# Patient Record
Sex: Female | Born: 1985
Health system: Southern US, Community
[De-identification: ages and names within clinical notes are randomized; demographics above are authoritative.]

## PROBLEM LIST (undated history)

## (undated) DIAGNOSIS — D649 Anemia, unspecified: Secondary | ICD-10-CM

## (undated) DIAGNOSIS — F339 Major depressive disorder, recurrent, unspecified: Secondary | ICD-10-CM

## (undated) DIAGNOSIS — D571 Sickle-cell disease without crisis: Secondary | ICD-10-CM

## (undated) DIAGNOSIS — G43909 Migraine, unspecified, not intractable, without status migrainosus: Secondary | ICD-10-CM

## (undated) HISTORY — PX: MULTIPLE TOOTH EXTRACTIONS: SHX2053

## (undated) HISTORY — PX: TUBAL LIGATION: SHX77

## (undated) HISTORY — PX: OTHER SURGICAL HISTORY: SHX169

---
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
## 2012-09-27 ENCOUNTER — Ambulatory Visit: Payer: Medicaid Other | Admitting: Internal Medicine

## 2012-10-04 ENCOUNTER — Ambulatory Visit: Payer: Medicaid Other | Admitting: Internal Medicine

## 2012-10-13 ENCOUNTER — Ambulatory Visit: Payer: Medicaid Other | Admitting: Internal Medicine

## 2013-06-14 ENCOUNTER — Telehealth: Payer: Self-pay | Admitting: General Practice

## 2013-06-14 NOTE — Telephone Encounter (Signed)
PA Newman Piesavid Leedy called to determine if/when patient has been seen at this practice.

## 2013-08-29 ENCOUNTER — Telehealth: Payer: Self-pay

## 2013-08-29 NOTE — Telephone Encounter (Signed)
Pt would like to set up an Est. Care Appointment. Pt is aware of the wait time and Pt is an [facility] Pt

## 2013-11-16 ENCOUNTER — Emergency Department (HOSPITAL_COMMUNITY)
Admission: EM | Admit: 2013-11-16 | Discharge: 2013-11-16 | Disposition: A | Discharge status: Home / Self Care | Payer: Medicaid Other | Attending: Emergency Medicine | Admitting: Emergency Medicine

## 2013-11-16 ENCOUNTER — Encounter (HOSPITAL_COMMUNITY): Payer: Self-pay | Admitting: Emergency Medicine

## 2013-11-16 ENCOUNTER — Emergency Department (HOSPITAL_COMMUNITY): Payer: Medicaid Other

## 2013-11-16 DIAGNOSIS — Z79899 Other long term (current) drug therapy: Secondary | ICD-10-CM | POA: Diagnosis not present

## 2013-11-16 DIAGNOSIS — F17200 Nicotine dependence, unspecified, uncomplicated: Secondary | ICD-10-CM | POA: Insufficient documentation

## 2013-11-16 DIAGNOSIS — R0789 Other chest pain: Secondary | ICD-10-CM | POA: Diagnosis not present

## 2013-11-16 DIAGNOSIS — D5700 Hb-SS disease with crisis, unspecified: Secondary | ICD-10-CM | POA: Insufficient documentation

## 2013-11-16 DIAGNOSIS — R0602 Shortness of breath: Secondary | ICD-10-CM | POA: Insufficient documentation

## 2013-11-16 HISTORY — DX: Sickle-cell disease without crisis: D57.1

## 2013-11-16 LAB — RETICULOCYTES
RBC.: 2.4 MIL/uL — ABNORMAL LOW (ref 3.87–5.11)
RETIC CT PCT: 7.7 % — AB (ref 0.4–3.1)
Retic Count, Absolute: 184.8 10*3/uL (ref 19.0–186.0)

## 2013-11-16 LAB — I-STAT CHEM 8, ED
BUN: 4 mg/dL — ABNORMAL LOW (ref 6–23)
CALCIUM ION: 1.19 mmol/L (ref 1.12–1.23)
Chloride: 113 mEq/L — ABNORMAL HIGH (ref 96–112)
Creatinine, Ser: 0.7 mg/dL (ref 0.50–1.10)
GLUCOSE: 95 mg/dL (ref 70–99)
HEMATOCRIT: 26 % — AB (ref 36.0–46.0)
Hemoglobin: 8.8 g/dL — ABNORMAL LOW (ref 12.0–15.0)
Potassium: 3.2 mEq/L — ABNORMAL LOW (ref 3.7–5.3)
Sodium: 142 mEq/L (ref 137–147)
TCO2: 17 mmol/L (ref 0–100)

## 2013-11-16 LAB — CBC WITH DIFFERENTIAL/PLATELET
Basophils Absolute: 0 10*3/uL (ref 0.0–0.1)
Basophils Relative: 0 % (ref 0–1)
EOS ABS: 0.1 10*3/uL (ref 0.0–0.7)
EOS PCT: 1 % (ref 0–5)
HCT: 22.9 % — ABNORMAL LOW (ref 36.0–46.0)
Hemoglobin: 8.4 g/dL — ABNORMAL LOW (ref 12.0–15.0)
Lymphocytes Relative: 40 % (ref 12–46)
Lymphs Abs: 4.5 10*3/uL — ABNORMAL HIGH (ref 0.7–4.0)
MCH: 35 pg — AB (ref 26.0–34.0)
MCHC: 36.7 g/dL — AB (ref 30.0–36.0)
MCV: 95.4 fL (ref 78.0–100.0)
Monocytes Absolute: 1.1 10*3/uL — ABNORMAL HIGH (ref 0.1–1.0)
Monocytes Relative: 9 % (ref 3–12)
NEUTROS PCT: 50 % (ref 43–77)
Neutro Abs: 5.7 10*3/uL (ref 1.7–7.7)
PLATELETS: 225 10*3/uL (ref 150–400)
RBC: 2.4 MIL/uL — ABNORMAL LOW (ref 3.87–5.11)
RDW: 24.3 % — ABNORMAL HIGH (ref 11.5–15.5)
WBC: 11.4 10*3/uL — ABNORMAL HIGH (ref 4.0–10.5)

## 2013-11-16 LAB — I-STAT TROPONIN, ED: TROPONIN I, POC: 0 ng/mL (ref 0.00–0.08)

## 2013-11-16 MED ORDER — HYDROMORPHONE HCL 1 MG/ML IJ SOLN
2.0000 mg | Freq: Once | INTRAMUSCULAR | Status: AC
  Administered 2013-11-16: 2 mg via INTRAVENOUS
  Filled 2013-11-16: qty 2

## 2013-11-16 MED ORDER — POTASSIUM CHLORIDE CRYS ER 20 MEQ PO TBCR
40.0000 meq | EXTENDED_RELEASE_TABLET | Freq: Once | ORAL | Status: AC
  Administered 2013-11-16: 40 meq via ORAL
  Filled 2013-11-16: qty 2

## 2013-11-16 MED ORDER — HEPARIN SOD (PORK) LOCK FLUSH 100 UNIT/ML IV SOLN
500.0000 [IU] | Freq: Once | INTRAVENOUS | Status: AC
  Administered 2013-11-16: 250 [IU]
  Filled 2013-11-16 (×2): qty 5

## 2013-11-16 MED ORDER — SODIUM CHLORIDE 0.9 % IV BOLUS (SEPSIS)
1000.0000 mL | Freq: Once | INTRAVENOUS | Status: AC
  Administered 2013-11-16: 1000 mL via INTRAVENOUS

## 2013-11-16 NOTE — Discharge Instructions (Signed)
Continue current medications. Please follow up with a primary care doctor.    Sickle Cell Anemia, Adult Sickle cell anemia is a condition in which red blood cells have an abnormal "sickle" shape. This abnormal shape shortens the cells' life span, which results in a lower than normal concentration of red blood cells in the blood. The sickle shape also causes the cells to clump together and block free blood flow through the blood vessels. As a result, the tissues and organs of the body do not receive enough oxygen. Sickle cell anemia causes organ damage and pain and increases the risk of infection. CAUSES  Sickle cell anemia is a genetic disorder. Those who receive two copies of the gene have the condition, and those who receive one copy have the trait. RISK FACTORS The sickle cell gene is most common in people whose families originated in [place]. Other areas of the globe where sickle cell trait occurs include the [place], [place], the [place], and the [place].  SIGNS AND SYMPTOMS  Pain, especially in the extremities, back, chest, or abdomen (common). The pain may start suddenly or may develop following an illness, especially if there is dehydration. Pain can also occur due to overexertion or exposure to extreme temperature changes.  Frequent severe bacterial infections, especially certain types of pneumonia and meningitis.  Pain and swelling in the hands and feet.  Decreased activity.   Loss of appetite.   Change in behavior.  Headaches.  Seizures.  Shortness of breath or difficulty breathing.  Vision changes.  Skin ulcers. Those with the trait may not have symptoms or they may have mild symptoms.  DIAGNOSIS  Sickle cell anemia is diagnosed with blood tests that demonstrate the genetic trait. It is often diagnosed during the newborn period, due to mandatory testing nationwide. A variety of blood tests, X-rays, CT scans, MRI scans, ultrasounds, and lung  function tests may also be done to monitor the condition. TREATMENT  Sickle cell anemia may be treated with:  Medicines. You may be given pain medicines, antibiotic medicines (to treat and prevent infections) or medicines to increase the production of certain types of hemoglobin.  Fluids.  Oxygen.  Blood transfusions. HOME CARE INSTRUCTIONS   Drink enough fluid to keep your urine clear or pale yellow. Increase your fluid intake in hot weather and during exercise.  Do not smoke. Smoking lowers oxygen levels in the blood.   Only take over-the-counter or prescription medicines for pain, fever, or discomfort as directed by your health care provider.  Take antibiotics as directed by your health care provider. Make sure you finish them it even if you start to feel better.   Take supplements as directed by your health care provider.   Consider wearing a medical alert bracelet. This tells anyone caring for you in an emergency of your condition.   When traveling, keep your medical information, health care provider's names, and the medicines you take with you at all times.   If you develop a fever, do not take medicines to reduce the fever right away. This could cover up a problem that is developing. Notify your health care provider.  Keep all follow-up appointments with your health care provider. Sickle cell anemia requires regular medical care. SEEK MEDICAL CARE IF: You have a fever. SEEK IMMEDIATE MEDICAL CARE IF:   You feel dizzy or faint.   You have new abdominal pain, especially on the left side near the stomach area.   You develop a persistent,  often uncomfortable and painful penile erection (priapism). If this is not treated immediately it will lead to impotence.   You have numbness your arms or legs or you have a hard time moving them.   You have a hard time with speech.   You have a fever or persistent symptoms for more than 2-3 days.   You have a fever and  your symptoms suddenly get worse.   You have signs or symptoms of infection. These include:   Chills.   Abnormal tiredness (lethargy).   Irritability.   Poor eating.   Vomiting.   You develop pain that is not helped with medicine.   You develop shortness of breath.  You have pain in your chest.   You are coughing up pus-like or bloody sputum.   You develop a stiff neck.  Your feet or hands swell or have pain.  Your abdomen appears bloated.  You develop joint pain. MAKE SURE YOU:  Understand these instructions. Document Released: 05/27/2005 Document Revised: 07/03/2013 Document Reviewed: 09/28/2012 [facility] Patient Information 2015 [place], [place]. This information is not intended to replace advice given to you by your health care provider. Make sure you discuss any questions you have with your health care provider.

## 2013-11-16 NOTE — ED Notes (Signed)
PT monitored by pulse ox, bp cuff, and 5-lead. 

## 2013-11-16 NOTE — ED Notes (Signed)
Pt in c/o sickle cell pain crisis, reports chest pain and bilateral leg pain which patient states is typical of her crisis, symptoms x2 days, no distress noted

## 2013-11-16 NOTE — ED Provider Notes (Signed)
CSN: [id number]     Arrival date & time 11/16/13  1434 History   First MD Initiated Contact with Patient 11/16/13 1610     Chief Complaint  Patient presents with  . Sickle Cell Pain Crisis     (Consider location/radiation/quality/duration/timing/severity/associated sxs/prior Treatment) HPI Brittany Foster is a 28 y.o. female with history of sickle cell disease, presents to emergency department complaining of bilateral leg pain and chest pain for 3 days. She states she currently does not have a primary care Dr., she does not want to see her Dr. Durwin Nora. She states she lives in [place] but is planning on moving to [place] and is trying to get in with an Outpatient clinic. Patient also reports a syncopal episode yesterday at home, states she remembers feeling dizzy and then waking up on the floor. States her daughter was present, and went and got help. States she was out for about 20 minutes. Patient was seen at [facility] 2 days ago after the syncopal episode which she told this was yesterday but in fact was 2 days ago, and was admitted overnight. Patient had lab work done which showed elevated chloride, bicarbonate of 18, hemoglobin of 8 which is at her baseline. She had an echo which was unremarkable. Also had a CT angio and chest x-ray which were both normal. Based on the records, apparently patient has been requesting frequent IV Dilaudid, which admitting doctor was spacing out every 4 hours and then he tried to wean her off and she became angry and decided she wanted to leave. Patient states today she is feeling worse, states she is here because she does not like [facility] and does not want to go there. She did not check her temperature at home, but states she has had chills. No other complaints   Past Medical History  Diagnosis Date  . Sickle cell anemia    History reviewed. No pertinent past surgical history. History reviewed. No pertinent family history. History  Substance Use Topics  .  Smoking status: Current Some Day Smoker  . Smokeless tobacco: Not on file  . Alcohol Use: Not on file   OB History   Grav Para Term Preterm Abortions TAB SAB Ect Mult Living                 Review of Systems  Constitutional: Negative for fever and chills.  Respiratory: Positive for chest tightness and shortness of breath. Negative for cough.   Cardiovascular: Positive for chest pain. Negative for palpitations and leg swelling.  Gastrointestinal: Negative for nausea, vomiting, abdominal pain and diarrhea.  Genitourinary: Negative for dysuria, flank pain and pelvic pain.  Musculoskeletal: Positive for arthralgias and myalgias. Negative for neck pain and neck stiffness.  Skin: Negative for rash.  Neurological: Negative for dizziness, weakness and headaches.  All other systems reviewed and are negative.     Allergies  Ultram and Morphine and related  Home Medications   Prior to Admission medications   Medication Sig Start Date End Date Taking? Authorizing Provider  folic acid (FOLVITE) 1 MG tablet Take 1 mg by mouth daily.   Yes Historical Provider, MD  gabapentin (NEURONTIN) 100 MG capsule Take 100 mg by mouth at bedtime.   Yes Historical Provider, MD  gabapentin (NEURONTIN) 300 MG capsule Take 300 mg by mouth 4 (four) times daily.   Yes Historical Provider, MD  hydroxyurea (HYDREA) 500 MG capsule Take 500 mg by mouth daily. May take with food to minimize GI side effects.  Yes Historical Provider, MD  Topiramate ER 100 MG CP24 Take 100 mg by mouth at bedtime.   Yes Historical Provider, MD   BP 104/67  Pulse 87  Temp(Src) 99 F (37.2 C) (Oral)  Resp 14  SpO2 100% Physical Exam  Nursing note and vitals reviewed. Constitutional: She is oriented to person, place, and time. She appears well-developed and well-nourished. No distress.  HENT:  Head: Normocephalic.  Eyes: Conjunctivae are normal. Pupils are equal, round, and reactive to light.  Neck: Neck supple.   Cardiovascular: Normal rate, regular rhythm and normal heart sounds.   Pulmonary/Chest: Effort normal and breath sounds normal. No respiratory distress. She has no wheezes. She has no rales. She exhibits tenderness.  Diffuse tenderness  Abdominal: Soft. Bowel sounds are normal. She exhibits no distension. There is no tenderness. There is no rebound.  Musculoskeletal: She exhibits no edema.  Neurological: She is alert and oriented to person, place, and time.  Skin: Skin is warm and dry.  Psychiatric: She has a normal mood and affect. Her behavior is normal.    ED Course  Procedures (including critical care time) Labs Review Labs Reviewed  CBC WITH DIFFERENTIAL - Abnormal; Notable for the following:    WBC 11.4 (*)    RBC 2.40 (*)    Hemoglobin 8.4 (*)    HCT 22.9 (*)    MCH 35.0 (*)    MCHC 36.7 (*)    RDW 24.3 (*)    Lymphs Abs 4.5 (*)    Monocytes Absolute 1.1 (*)    All other components within normal limits  RETICULOCYTES - Abnormal; Notable for the following:    Retic Ct Pct 7.7 (*)    RBC. 2.40 (*)    All other components within normal limits  I-STAT CHEM 8, ED - Abnormal; Notable for the following:    Potassium 3.2 (*)    Chloride 113 (*)    BUN 4 (*)    Hemoglobin 8.8 (*)    HCT 26.0 (*)    All other components within normal limits  Rosezena Sensor, ED    Imaging Review Dg Chest 2 View  11/16/2013   CLINICAL DATA:  28 year old female with chest pain and sickle cell disease.  EXAM: CHEST  2 VIEW  COMPARISON:  None.  FINDINGS: Cardiomegaly identified.  A right Port-A-Cath is present with tip overlying the upper right atrium.  There is no evidence of focal airspace disease, pulmonary edema, suspicious pulmonary nodule/mass, pleural effusion, or pneumothorax. No acute bony abnormalities are identified.  IMPRESSION: Cardiomegaly without evidence of acute cardiopulmonary disease.  Port-A-Cath with tip overlying the upper right atrium.   Electronically Signed   By: Laveda Abbe  M.D.   On: 11/16/2013 18:39     EKG Interpretation   Date/Time:  Thursday November 16 2013 14:46:40 EDT Ventricular Rate:  87 PR Interval:  150 QRS Duration: 90 QT Interval:  376 QTC Calculation: 452 R Axis:   80 Text Interpretation:  Normal sinus rhythm RSR' or QR pattern in V1  suggests right ventricular conduction delay Borderline ECG Confirmed by  WARD,  DO, KRISTEN ([id number]) on 11/16/2013 7:06:08 PM      MDM   Final diagnoses:  Sickle cell anemia with crisis    Patient's here complaining of pain, states pain is in her legs and her chest. Admitted 2 days ago to [facility] for syncopal episode and same pain complaints. Had multiple tests done including CT imaging of the chest, chest x-ray, echo, labs.  Sounds like she was asking for Dilaudid and when they tried to wean her off she became angry and left AMA. Will get labs here, treat pain, hydrate.   8:15 PM Side effects patient's hemoglobin is at her baseline. Chest x-ray unremarkable. No evidence of acute chest syndrome. Vital signs normal. Doubt PE, negative CT angio yesterday. ECHO unremarkable.  Patient received 3 doses of 2 mg IV Dilaudid. She received 2 L of IV fluids. She is feeling better. We'll discharge home.  Filed Vitals:   11/16/13 1900 11/16/13 1915 11/16/13 1930 11/16/13 2000  BP: 109/63 104/53 128/76 118/67  Pulse: 88 81 120 108  Temp:      TempSrc:      Resp: SpO2: 100% 100% 99% 100%       Aashika Carta A Kanija Remmel, PA-C 11/17/13 0025

## 2013-11-16 NOTE — ED Notes (Signed)
Pt states that her ride is not going to wait for her much longer and needs to leave. This RN told the pt that she needed to stay for 30 minutes after receiving her last dose of dilaudid. Pt states she understands. Kirichenko, PA aware that the pt would like to leave.

## 2013-11-18 NOTE — ED Provider Notes (Signed)
Medical screening examination/treatment/procedure(s) were performed by non-physician practitioner and as supervising physician I was immediately available for consultation/collaboration.   EKG Interpretation   Date/Time:  Thursday November 16 2013 14:46:40 EDT Ventricular Rate:  87 PR Interval:  150 QRS Duration: 90 QT Interval:  376 QTC Calculation: 452 R Axis:   80 Text Interpretation:  Normal sinus rhythm RSR' or QR pattern in V1  suggests right ventricular conduction delay Borderline ECG Confirmed by  WARD,  DO, KRISTEN ([id number]) on 11/16/2013 7:06:08 PM        Layla Maw Ward, DO 11/18/13 1710

## 2013-12-30 ENCOUNTER — Encounter (HOSPITAL_COMMUNITY): Payer: Self-pay | Admitting: Emergency Medicine

## 2013-12-30 ENCOUNTER — Inpatient Hospital Stay (HOSPITAL_COMMUNITY): Payer: Medicaid Other

## 2013-12-30 ENCOUNTER — Emergency Department (HOSPITAL_COMMUNITY): Payer: Medicaid Other

## 2013-12-30 ENCOUNTER — Inpatient Hospital Stay (HOSPITAL_COMMUNITY)
Admission: EM | Admit: 2013-12-30 | Discharge: 2014-01-03 | DRG: 812 | Disposition: A | Discharge status: Home / Self Care | Payer: Medicaid Other | Attending: Internal Medicine | Admitting: Internal Medicine

## 2013-12-30 DIAGNOSIS — D571 Sickle-cell disease without crisis: Secondary | ICD-10-CM | POA: Diagnosis not present

## 2013-12-30 DIAGNOSIS — R269 Unspecified abnormalities of gait and mobility: Secondary | ICD-10-CM | POA: Diagnosis present

## 2013-12-30 DIAGNOSIS — R079 Chest pain, unspecified: Secondary | ICD-10-CM | POA: Diagnosis present

## 2013-12-30 DIAGNOSIS — Z9119 Patient's noncompliance with other medical treatment and regimen: Secondary | ICD-10-CM | POA: Diagnosis present

## 2013-12-30 DIAGNOSIS — G43909 Migraine, unspecified, not intractable, without status migrainosus: Secondary | ICD-10-CM | POA: Diagnosis present

## 2013-12-30 DIAGNOSIS — M549 Dorsalgia, unspecified: Secondary | ICD-10-CM

## 2013-12-30 DIAGNOSIS — I517 Cardiomegaly: Secondary | ICD-10-CM | POA: Diagnosis present

## 2013-12-30 DIAGNOSIS — R9389 Abnormal findings on diagnostic imaging of other specified body structures: Secondary | ICD-10-CM | POA: Insufficient documentation

## 2013-12-30 DIAGNOSIS — M79606 Pain in leg, unspecified: Secondary | ICD-10-CM

## 2013-12-30 DIAGNOSIS — D5700 Hb-SS disease with crisis, unspecified: Principal | ICD-10-CM | POA: Diagnosis present

## 2013-12-30 LAB — RETICULOCYTES
RBC.: 2.14 MIL/uL — AB (ref 3.87–5.11)
RBC.: 1.93 MIL/uL — ABNORMAL LOW (ref 3.87–5.11)
RETIC COUNT ABSOLUTE: 440.8 10*3/uL — AB (ref 19.0–186.0)
RETIC COUNT ABSOLUTE: 401.4 10*3/uL — AB (ref 19.0–186.0)
RETIC CT PCT: 20.6 % — AB (ref 0.4–3.1)
Retic Ct Pct: 20.8 % — ABNORMAL HIGH (ref 0.4–3.1)

## 2013-12-30 LAB — BASIC METABOLIC PANEL
ANION GAP: 13 (ref 5–15)
BUN: 8 mg/dL (ref 6–23)
CO2: 22 meq/L (ref 19–32)
CREATININE: 0.47 mg/dL — AB (ref 0.50–1.10)
Calcium: 8.9 mg/dL (ref 8.4–10.5)
Chloride: 108 mEq/L (ref 96–112)
GFR calc Af Amer: >90 mL/min (ref >90)
GFR calc non Af Amer: >90 mL/min (ref >90)
Glucose, Bld: 96 mg/dL (ref 70–99)
Potassium: 3.6 mEq/L — ABNORMAL LOW (ref 3.7–5.3)
Sodium: 143 mEq/L (ref 137–147)

## 2013-12-30 LAB — URINALYSIS, ROUTINE W REFLEX MICROSCOPIC
BILIRUBIN URINE: NEGATIVE
Glucose, UA: NEGATIVE mg/dL
Hgb urine dipstick: NEGATIVE
Ketones, ur: NEGATIVE mg/dL
NITRITE: NEGATIVE
PH: 6.5 (ref 5.0–8.0)
Protein, ur: NEGATIVE mg/dL
Specific Gravity, Urine: 1.012 (ref 1.005–1.030)
Urobilinogen, UA: 1 mg/dL (ref 0.0–1.0)

## 2013-12-30 LAB — CREATININE, SERUM
CREATININE: 0.43 mg/dL — AB (ref 0.50–1.10)
GFR calc Af Amer: >90 mL/min (ref >90)

## 2013-12-30 LAB — URINE MICROSCOPIC-ADD ON

## 2013-12-30 LAB — I-STAT TROPONIN, ED: Troponin i, poc: 0.01 ng/mL (ref 0.00–0.08)

## 2013-12-30 LAB — D-DIMER, QUANTITATIVE (NOT AT ARMC): D DIMER QUANT: 1.12 ug{FEU}/mL — AB (ref 0.00–0.48)

## 2013-12-30 LAB — CBC WITH DIFFERENTIAL/PLATELET
Basophils Absolute: 0.2 10*3/uL — ABNORMAL HIGH (ref 0.0–0.1)
Basophils Relative: 1 % (ref 0–1)
Eosinophils Absolute: 0 10*3/uL (ref 0.0–0.7)
Eosinophils Relative: 0 % (ref 0–5)
HEMATOCRIT: 20.7 % — AB (ref 36.0–46.0)
Hemoglobin: 7.4 g/dL — ABNORMAL LOW (ref 12.0–15.0)
LYMPHS ABS: 4.2 10*3/uL — AB (ref 0.7–4.0)
Lymphocytes Relative: 27 % (ref 12–46)
MCH: 34.6 pg — AB (ref 26.0–34.0)
MCHC: 35.7 g/dL (ref 30.0–36.0)
MCV: 96.7 fL (ref 78.0–100.0)
MONO ABS: 1.6 10*3/uL — AB (ref 0.1–1.0)
Monocytes Relative: 10 % (ref 3–12)
Neutro Abs: 9.5 10*3/uL — ABNORMAL HIGH (ref 1.7–7.7)
Neutrophils Relative %: 62 % (ref 43–77)
PLATELETS: 379 10*3/uL (ref 150–400)
RBC: 2.14 MIL/uL — ABNORMAL LOW (ref 3.87–5.11)
RDW: 21.7 % — ABNORMAL HIGH (ref 11.5–15.5)
WBC: 15.5 10*3/uL — ABNORMAL HIGH (ref 4.0–10.5)

## 2013-12-30 LAB — CBC
HEMATOCRIT: 18.3 % — AB (ref 36.0–46.0)
HEMOGLOBIN: 6.6 g/dL — AB (ref 12.0–15.0)
MCH: 34.2 pg — ABNORMAL HIGH (ref 26.0–34.0)
MCHC: 36.1 g/dL — AB (ref 30.0–36.0)
MCV: 94.8 fL (ref 78.0–100.0)
Platelets: 349 10*3/uL (ref 150–400)
RBC: 1.93 MIL/uL — ABNORMAL LOW (ref 3.87–5.11)
RDW: 22.2 % — ABNORMAL HIGH (ref 11.5–15.5)
WBC: 14 10*3/uL — ABNORMAL HIGH (ref 4.0–10.5)

## 2013-12-30 LAB — TROPONIN I

## 2013-12-30 LAB — POC URINE PREG, ED: Preg Test, Ur: NEGATIVE

## 2013-12-30 LAB — LACTATE DEHYDROGENASE: LDH: 356 U/L — AB (ref 94–250)

## 2013-12-30 MED ORDER — TOPIRAMATE 25 MG PO TABS
50.0000 mg | ORAL_TABLET | Freq: Two times a day (BID) | ORAL | Status: DC
  Administered 2013-12-30 – 2014-01-02 (×7): 50 mg via ORAL
  Filled 2013-12-30 (×9): qty 2

## 2013-12-30 MED ORDER — HYDROMORPHONE HCL 1 MG/ML IJ SOLN
1.0000 mg | Freq: Once | INTRAMUSCULAR | Status: AC
  Administered 2013-12-30: 1 mg via INTRAVENOUS
  Filled 2013-12-30: qty 1

## 2013-12-30 MED ORDER — SODIUM CHLORIDE 0.9 % IJ SOLN: 9.0000 mL | INTRAMUSCULAR | Status: DC | PRN

## 2013-12-30 MED ORDER — HYDROMORPHONE HCL 2 MG/ML IJ SOLN
2.0000 mg | Freq: Once | INTRAMUSCULAR | Status: AC
  Administered 2013-12-30: 2 mg via INTRAVENOUS
  Filled 2013-12-30: qty 1

## 2013-12-30 MED ORDER — HYDROMORPHONE 2 MG/ML HIGH CONCENTRATION IV PCA SOLN
INTRAVENOUS | Status: DC
  Administered 2013-12-30: 0.49 mg via INTRAVENOUS
  Administered 2013-12-31: 2.94 mg via INTRAVENOUS
  Administered 2013-12-31: 9.8 mg via INTRAVENOUS

## 2013-12-30 MED ORDER — NAPROXEN 500 MG PO TABS
500.0000 mg | ORAL_TABLET | Freq: Three times a day (TID) | ORAL | Status: DC | PRN
  Filled 2013-12-30: qty 1

## 2013-12-30 MED ORDER — HYDROMORPHONE HCL 2 MG/ML IJ SOLN
2.0000 mg | Freq: Once | INTRAMUSCULAR | Status: AC
  Administered 2013-12-30: 2 mg via INTRAMUSCULAR
  Filled 2013-12-30: qty 1

## 2013-12-30 MED ORDER — GABAPENTIN 300 MG PO CAPS
300.0000 mg | ORAL_CAPSULE | Freq: Four times a day (QID) | ORAL | Status: DC
  Administered 2013-12-30 – 2014-01-02 (×12): 300 mg via ORAL
  Filled 2013-12-30 (×17): qty 1

## 2013-12-30 MED ORDER — SODIUM CHLORIDE 0.45 % IV SOLN
INTRAVENOUS | Status: AC
  Administered 2013-12-30 – 2013-12-31 (×2): via INTRAVENOUS

## 2013-12-30 MED ORDER — ENOXAPARIN SODIUM 40 MG/0.4ML ~~LOC~~ SOLN
40.0000 mg | Freq: Every day | SUBCUTANEOUS | Status: DC
  Administered 2013-12-30 – 2014-01-02 (×2): 40 mg via SUBCUTANEOUS
  Filled 2013-12-30 (×4): qty 0.4

## 2013-12-30 MED ORDER — HYDROXYUREA 500 MG PO CAPS
500.0000 mg | ORAL_CAPSULE | Freq: Every day | ORAL | Status: DC
  Administered 2013-12-31 – 2014-01-02 (×3): 500 mg via ORAL
  Filled 2013-12-30 (×4): qty 1

## 2013-12-30 MED ORDER — FOLIC ACID 1 MG PO TABS
1.0000 mg | ORAL_TABLET | Freq: Every day | ORAL | Status: DC
  Administered 2013-12-31 – 2014-01-02 (×3): 1 mg via ORAL
  Filled 2013-12-30 (×4): qty 1

## 2013-12-30 MED ORDER — STERILE WATER FOR INJECTION IJ SOLN
INTRAMUSCULAR | Status: AC
  Administered 2013-12-30: 16:00:00
  Filled 2013-12-30: qty 10

## 2013-12-30 MED ORDER — ONDANSETRON HCL 4 MG/2ML IJ SOLN
4.0000 mg | Freq: Four times a day (QID) | INTRAMUSCULAR | Status: DC | PRN
  Administered 2013-12-31 – 2014-01-01 (×2): 4 mg via INTRAVENOUS
  Filled 2013-12-30 (×2): qty 2

## 2013-12-30 MED ORDER — SODIUM CHLORIDE 0.9 % IV BOLUS (SEPSIS)
1000.0000 mL | Freq: Once | INTRAVENOUS | Status: AC
  Administered 2013-12-30: 1000 mL via INTRAVENOUS

## 2013-12-30 MED ORDER — IOHEXOL 350 MG/ML SOLN
100.0000 mL | Freq: Once | INTRAVENOUS | Status: AC | PRN
  Administered 2013-12-30: 100 mL via INTRAVENOUS

## 2013-12-30 MED ORDER — GABAPENTIN 100 MG PO CAPS
100.0000 mg | ORAL_CAPSULE | Freq: Every day | ORAL | Status: DC
  Administered 2013-12-30 – 2014-01-02 (×4): 100 mg via ORAL
  Filled 2013-12-30 (×6): qty 1

## 2013-12-30 MED ORDER — NALOXONE HCL 0.4 MG/ML IJ SOLN: 0.4000 mg | INTRAMUSCULAR | Status: DC | PRN

## 2013-12-30 MED ORDER — FOLIC ACID 1 MG PO TABS: 1.0000 mg | ORAL_TABLET | Freq: Every day | ORAL | Status: DC

## 2013-12-30 NOTE — ED Notes (Signed)
Per pt, states chest heavy and SOB since yesterday-also complaining of leg pain

## 2013-12-30 NOTE — ED Provider Notes (Signed)
Medical screening examination/treatment/procedure(s) were conducted as a shared visit with non-physician practitioner(s) and myself.  I personally evaluated the patient during the encounter.   EKG Interpretation   Date/Time:  Saturday December 30 2013 15:34:52 EDT Ventricular Rate:  89 PR Interval:  163 QRS Duration: 87 QT Interval:  381 QTC Calculation: 464 R Axis:   88 Text Interpretation:  Sinus rhythm Baseline wander in lead(s) V2 V4 V6 No  significant change since last tracing Confirmed by Ethelda ChickJACUBOWITZ  MD, Adeeb Konecny  [id number]) on 12/30/2013 5:53:28 PM       Doug SouSam Espn Zeman, MD 12/30/13 2355

## 2013-12-30 NOTE — ED Provider Notes (Signed)
CSN: [id number]     Arrival date & time 12/30/13  1524 History   First MD Initiated Contact with Patient 12/30/13 1547     Chief Complaint  Patient presents with  . Chest Pain  . Leg Pain     (Consider location/radiation/quality/duration/timing/severity/associated sxs/prior Treatment) HPI Brittany Foster is a 28 y.o. female with a history of sickle cell disease who comes in today for evaluation of chest pain, back pain, leg pain. Patient states her pain has been going on since yesterday morning when she was just "lying around the house". She characterizes the chest pain as a sharp stabbing sensation in her central chest that does not radiate anywhere else there is also a pressure quality. She reports both the sharp stabbing pain as well as the pressure have been constant since yesterday and have not abated. She reports the leg pain and back pain is "exactly" like her previous sickle cell pain. She reports taking her last oxycodone yesterday for the pain, but that did not help. Patient reports when she comes to the ED she normally has Dilaudid, because morphine "gives me the shakes" and tramadol "gives me seizures". Reports smoking 1 cigarette every 3-4 weeks.  Past Medical History  Diagnosis Date  . Sickle cell anemia    History reviewed. No pertinent past surgical history. No family history on file. History  Substance Use Topics  . Smoking status: Current Some Day Smoker  . Smokeless tobacco: Not on file  . Alcohol Use: No   OB History   Grav Para Term Preterm Abortions TAB SAB Ect Mult Living                 Review of Systems  Constitutional: Negative for fever.  HENT: Negative for sore throat.   Eyes: Negative for visual disturbance.  Respiratory: Negative for shortness of breath.   Cardiovascular: Positive for chest pain.  Gastrointestinal: Negative for abdominal pain.  Endocrine: Negative for polyuria.  Genitourinary: Negative for dysuria.  Musculoskeletal: Positive for  back pain and myalgias.  Skin: Negative for rash.  Neurological: Negative for weakness and headaches.      Allergies  Ultram and Morphine and related  Home Medications   Prior to Admission medications   Medication Sig Start Date End Date Taking? Authorizing Provider  folic acid (FOLVITE) 1 MG tablet Take 1 mg by mouth daily.   Yes Historical Provider, MD  gabapentin (NEURONTIN) 100 MG capsule Take 100 mg by mouth at bedtime.   Yes Historical Provider, MD  gabapentin (NEURONTIN) 300 MG capsule Take 300 mg by mouth 4 (four) times daily.   Yes Historical Provider, MD  hydroxyurea (HYDREA) 500 MG capsule Take 500 mg by mouth daily. May take with food to minimize GI side effects.   Yes Historical Provider, MD  Naproxen Sodium (ALEVE) 220 MG CAPS Take 660 mg by mouth every 6 (six) hours as needed (pain).   Yes Historical Provider, MD  Topiramate ER 100 MG CP24 Take 100 mg by mouth at bedtime.   Yes Historical Provider, MD   BP 106/57  Pulse 93  Temp(Src) 98.6 F (37 C) (Oral)  Resp 21  SpO2 99% Physical Exam  Nursing note and vitals reviewed. Constitutional: She is oriented to person, place, and time. She appears well-developed and well-nourished.  HENT:  Head: Normocephalic and atraumatic.  Mouth/Throat: Oropharynx is clear and moist.  Eyes: Conjunctivae are normal. Pupils are equal, round, and reactive to light. Right eye exhibits no discharge. Left eye exhibits  no discharge. No scleral icterus.  Neck: Neck supple.  Cardiovascular: Normal rate, regular rhythm and normal heart sounds.   Pulmonary/Chest: Effort normal and breath sounds normal. No respiratory distress. She has no wheezes. She has no rales.  Not tachypneic on my exam, tenderness to palpation in mid sternum that reproduces sharp stabbing pain.  Abdominal: Soft. There is no tenderness.  Musculoskeletal: She exhibits no tenderness.  Neurological: She is alert and oriented to person, place, and time.  Cranial Nerves  II-XII grossly intact  Skin: Skin is warm and dry. No rash noted.  Psychiatric: She has a normal mood and affect.    ED Course  Procedures (including critical care time) Labs Review Labs Reviewed  CBC WITH DIFFERENTIAL  BASIC METABOLIC PANEL  URINALYSIS, ROUTINE W REFLEX MICROSCOPIC  RETICULOCYTES  POC URINE PREG, ED    Imaging Review No results found.   EKG Interpretation   Date/Time:  Saturday December 30 2013 15:34:52 EDT Ventricular Rate:  89 PR Interval:  163 QRS Duration: 87 QT Interval:  381 QTC Calculation: 464 R Axis:   88 Text Interpretation:  Sinus rhythm Baseline wander in lead(s) V2 V4 V6 No  significant change since last tracing Confirmed by JACUBOWITZ  MD, SAM  [id number]) on 12/30/2013 5:53:28 PM      MDM  Vitals stable - WNL -afebrile , Pt not tachypneic on my exam. Pt resting comfortably in ED. fluids given in ED and attempted pain management with Dilaudid. PE not concerning for other acute or emergent pathology. No fever, cough, sat 99%--doubt acute chest. D Dimer 1.12, but had a 7.9 d dimer in August with negative CT angio. Doubt PE, pt has had 3 neg CT angio in May, August and September 2015.  Labwork noncontributory, Troponin neg Imaging--chest x-ray shows no acute cardiopulmonary pathology Unable to control pain in ED, will admit to hospital for further management. Spoke with Toniann FailKakrakandy, will admit to Med Surg.  Discussed f/u with PCP and return precautions, pt very amenable to plan. Prior to patient discharge, I discussed and reviewed this case with Dr.Jacubowitz     Final diagnoses:  Chest pain, unspecified chest pain type  Pain of lower extremity, unspecified laterality  Bilateral back pain, unspecified location  Hb-SS disease without crisis        Sharlene MottsBenjamin W Tulio Facundo, PA-C 12/30/13 2221

## 2013-12-30 NOTE — H&P (Addendum)
[facility] History and Physical  Brittany CampbellMiranda Foster ZOX:[id number]:[id number] DOB: 09/30/1985 DOA: 12/30/2013  Referring physician: ER physician. PCP: Pcp Not In System   Chief Complaint: Low back pain and chest pain.  HPI: Brittany Foster is a 28 y.o. female with history of sickle cell anemia presents to the ER with complaints of increasing low back pain and chest pain. Patient has been having these symptoms for last 2 days with mild shortness of breath denies any associated fever chills or productive cough. In the ER patient was found to be afebrile chest x-ray does not show anything acute. Patient has been admitted for sickle cell pain crisis. Patient's cardiac markers and EKG are unremarkable. D-dimer is mildly elevated and CT angiography of the chest is pending.   Review of Systems: As presented in the history of presenting illness, rest negative.  Past Medical History  Diagnosis Date  . Sickle cell anemia    Past Surgical History  Procedure Laterality Date  . Tubal ligation    . Cholecystectomy    . Cesarean section     Social History:  reports that she has been smoking.  She does not have any smokeless tobacco history on file. She reports that she does not drink alcohol or use illicit drugs. Where does patient live home. Can patient participate in ADLs? Yes.  Allergies  Allergen Reactions  . Ultram [Tramadol] Other (See Comments)    seizures  . Morphine And Related Hives, Rash and Other (See Comments)    shaking    Family History:  Family History  Problem Relation Age of Onset  . Sickle cell anemia Other       Prior to Admission medications   Medication Sig Start Date End Date Taking? Authorizing Provider  folic acid (FOLVITE) 1 MG tablet Take 1 mg by mouth daily.   Yes Historical Provider, MD  gabapentin (NEURONTIN) 100 MG capsule Take 100 mg by mouth at bedtime.   Yes Historical Provider, MD  gabapentin (NEURONTIN) 300 MG capsule Take 300 mg by mouth 4 (four) times daily.    Yes Historical Provider, MD  hydroxyurea (HYDREA) 500 MG capsule Take 500 mg by mouth daily. May take with food to minimize GI side effects.   Yes Historical Provider, MD  Naproxen Sodium (ALEVE) 220 MG CAPS Take 660 mg by mouth every 6 (six) hours as needed (pain).   Yes Historical Provider, MD  Topiramate ER 100 MG CP24 Take 100 mg by mouth at bedtime.   Yes Historical Provider, MD    Physical Exam: Filed Vitals:   12/30/13 1534 12/30/13 1752 12/30/13 1952 12/30/13 2029  BP: 106/57 105/61 107/54 104/69  Pulse: 93 93 82 89  Temp: 98.6 F (37 C) 98.7 F (37.1 C)    TempSrc: Oral Oral    Resp: 21 16 14 16   SpO2: 99% 100% 91% 93%     General:  Moderately built and nourished.  Eyes: Anicteric. No pallor.  ENT: No discharge from the ears eyes nose mouth.  Neck: No mass felt.  Cardiovascular: S1-S2 heard.  Respiratory: No rhonchi or crepitations.  Abdomen: Soft nontender bowel sounds present. No guarding or rigidity.  Skin: No rash.  Musculoskeletal: No edema.  Psychiatric: Appears normal.  Neurologic: Alert awake oriented to time place and person. Moves all extremities.  Labs on Admission:  Basic Metabolic Panel:  Recent Labs Lab 12/30/13 1615  NA 143  K 3.6*  CL 108  CO2 22  GLUCOSE 96  BUN 8  CREATININE  0.47*  CALCIUM 8.9   Liver Function Tests: No results found for this basename: AST, ALT, ALKPHOS, BILITOT, PROT, ALBUMIN,  in the last 168 hours No results found for this basename: LIPASE, AMYLASE,  in the last 168 hours No results found for this basename: AMMONIA,  in the last 168 hours CBC:  Recent Labs Lab 12/30/13 1615  WBC 15.5*  NEUTROABS 9.5*  HGB 7.4*  HCT 20.7*  MCV 96.7  PLT 379   Cardiac Enzymes: No results found for this basename: CKTOTAL, CKMB, CKMBINDEX, TROPONINI,  in the last 168 hours  BNP (last 3 results) No results found for this basename: PROBNP,  in the last 8760 hours CBG: No results found for this basename: GLUCAP,   in the last 168 hours  Radiological Exams on Admission: Dg Chest 2 View  12/30/2013   CLINICAL DATA:  Chest pain.  EXAM: CHEST  2 VIEW  COMPARISON:  11/16/2013.  FINDINGS: Prior port catheter in stable position. Mediastinum and hilar structures normal. Heart size normal. No pleural effusion or pneumothorax. No acute bony abnormality. Surgical clips right upper quadrant.  IMPRESSION: 1. No acute cardiopulmonary disease. 2. Stable cardiomegaly. 3. Power port in stable anatomic position.   Electronically Signed   By: Maisie Fushomas  Register   On: 12/30/2013 16:53    EKG: Independently reviewed. Normal sinus rhythm.  Assessment/Plan Principal Problem:   Sickle cell anemia with pain Active Problems:   Chest pain   Sickle cell pain crisis   1. Sickle cell pain crisis - patient has been placed on weight-based Dilaudid PCA pump. Continue gentle hydration and home medications. 2. Chest pain - patient at this time is afebrile and not hypoxic and does not have any definite signs for acute chest syndrome. Check CT chest angiogram to rule out PE since d-dimer was elevated. Troponins and EKG are unremarkable. 3. Chronic anemia from sickle cell disease - follow CBC. 4. Leukocytosis - probably reactionary. Follow CBC. Patient is afebrile.  Addendum - CT scan of the chest was negative for PE but did show a catheter fragment in the left subclavian vein and the radiologist has recommended interventional radiology consult which I have placed at this time. I have discussed the results of the patient and patient stated that she has had a Port-A-Cath placed many years ago. Patient will need interventional radiology consult in a.m. As recommended by the radiologist.    Code Status: Full code.  Family Communication: None.  Disposition Plan: Admit to inpatient.    Brittany Foster. [facility] Pager [phone].  If 7PM-7AM, please contact night-coverage [email or web address] Password [email or web address] 12/30/2013, 10:10  PM

## 2013-12-30 NOTE — ED Provider Notes (Signed)
Complains of anterior chest pain, back pain and bilateral leg pain gradual onset yesterday typical of sickle cell crisis. Symptoms accompanied by shortness of breath. Nothing makes pain better or worse. No fever. No other associated symptoms she treated herself with Aleve and oxycodone, without adequate pain relief. Patient has had 3 CT angiograms of her chest, September 2000 10/14/2013 and May 2015 00 which were negative for pulmonary embolism, there was pericardial effusion present. Clinically doubt pulmonary embolism with gradual onset of symptoms. Normal respiratory rate normal pulse oximetry Note on 10/18/2013 d-dimer was 7.9. Noted to be lower today. Strongly doubt pulmonary embolism  Doug SouSam Janel Beane, MD 12/30/13 2127

## 2013-12-31 DIAGNOSIS — R9389 Abnormal findings on diagnostic imaging of other specified body structures: Secondary | ICD-10-CM | POA: Insufficient documentation

## 2013-12-31 DIAGNOSIS — R938 Abnormal findings on diagnostic imaging of other specified body structures: Secondary | ICD-10-CM

## 2013-12-31 LAB — CBC WITH DIFFERENTIAL/PLATELET
BASOS PCT: 0 % (ref 0–1)
Basophils Absolute: 0.1 10*3/uL (ref 0.0–0.1)
Eosinophils Absolute: 0.2 10*3/uL (ref 0.0–0.7)
Eosinophils Relative: 1 % (ref 0–5)
HEMATOCRIT: 17.6 % — AB (ref 36.0–46.0)
HEMOGLOBIN: 6.3 g/dL — AB (ref 12.0–15.0)
LYMPHS ABS: 5.1 10*3/uL — AB (ref 0.7–4.0)
Lymphocytes Relative: 36 % (ref 12–46)
MCH: 34.4 pg — AB (ref 26.0–34.0)
MCHC: 35.8 g/dL (ref 30.0–36.0)
MCV: 96.2 fL (ref 78.0–100.0)
MONO ABS: 1.3 10*3/uL — AB (ref 0.1–1.0)
MONOS PCT: 9 % (ref 3–12)
NEUTROS ABS: 7.3 10*3/uL (ref 1.7–7.7)
Neutrophils Relative %: 53 % (ref 43–77)
Platelets: 311 10*3/uL (ref 150–400)
RBC: 1.83 MIL/uL — AB (ref 3.87–5.11)
RDW: 22.7 % — ABNORMAL HIGH (ref 11.5–15.5)
WBC: 13.9 10*3/uL — ABNORMAL HIGH (ref 4.0–10.5)

## 2013-12-31 LAB — RAPID URINE DRUG SCREEN, HOSP PERFORMED
Amphetamines: NOT DETECTED
BENZODIAZEPINES: NOT DETECTED
Barbiturates: NOT DETECTED
Cocaine: NOT DETECTED
OPIATES: POSITIVE — AB
Tetrahydrocannabinol: NOT DETECTED

## 2013-12-31 LAB — COMPREHENSIVE METABOLIC PANEL
ALBUMIN: 3.4 g/dL — AB (ref 3.5–5.2)
ALT: 22 U/L (ref 0–35)
AST: 39 U/L — ABNORMAL HIGH (ref 0–37)
Alkaline Phosphatase: 90 U/L (ref 39–117)
Anion gap: 9 (ref 5–15)
BUN: 6 mg/dL (ref 6–23)
CALCIUM: 8.4 mg/dL (ref 8.4–10.5)
CHLORIDE: 106 meq/L (ref 96–112)
CO2: 25 meq/L (ref 19–32)
CREATININE: 0.47 mg/dL — AB (ref 0.50–1.10)
GFR calc Af Amer: >90 mL/min (ref >90)
GFR calc non Af Amer: >90 mL/min (ref >90)
GLUCOSE: 110 mg/dL — AB (ref 70–99)
Potassium: 3.4 mEq/L — ABNORMAL LOW (ref 3.7–5.3)
Sodium: 140 mEq/L (ref 137–147)
Total Bilirubin: 2.5 mg/dL — ABNORMAL HIGH (ref 0.3–1.2)
Total Protein: 6.7 g/dL (ref 6.0–8.3)

## 2013-12-31 MED ORDER — PANTOPRAZOLE SODIUM 40 MG PO TBEC
40.0000 mg | DELAYED_RELEASE_TABLET | Freq: Every day | ORAL | Status: DC
  Filled 2013-12-31 (×4): qty 1

## 2013-12-31 MED ORDER — HYDROMORPHONE 2 MG/ML HIGH CONCENTRATION IV PCA SOLN
INTRAVENOUS | Status: DC
  Administered 2013-12-31: 20:00:00 via INTRAVENOUS
  Administered 2013-12-31: 13.96 mg via INTRAVENOUS
  Administered 2013-12-31 (×2): 8 mg via INTRAVENOUS
  Administered 2014-01-01: 5.8 mg via INTRAVENOUS
  Administered 2014-01-01: 8 mg via INTRAVENOUS
  Administered 2014-01-01: 10.4 mg via INTRAVENOUS
  Administered 2014-01-01: 7.2 mg via INTRAVENOUS
  Administered 2014-01-01 (×2): 8 mg via INTRAVENOUS
  Filled 2013-12-31 (×2): qty 25

## 2013-12-31 MED ORDER — KETOROLAC TROMETHAMINE 15 MG/ML IJ SOLN
15.0000 mg | Freq: Four times a day (QID) | INTRAMUSCULAR | Status: DC
  Administered 2013-12-31 (×2): 15 mg via INTRAVENOUS
  Filled 2013-12-31 (×5): qty 1

## 2013-12-31 NOTE — Progress Notes (Signed)
[facility] PROGRESS NOTE  Brittany Foster ZOX:[id number] DOB: 11/22/1985 DOA: 12/30/2013 PCP: Pcp Not In System  Assessment/Plan: 1. Sickle cell pain crises -rates pain at 8.5-9, but has only used 3mg  in 4hours since admission -encouraged to use PCA more, will also increase bolus dose -add Toradol -continue IVF, O2, hydroxyurea and folic acid  2. Anemia-due to SCD, hemolysis, hemodilution -baseline unknown, pt suspects this is around 9 but unsure - i have [facility] [place] if baseline indeed much higher    3. Catheter fragment in L innominate vein -noted on CT this am -unclear how long she had this, i called and d/w Dr.Yamagata (IR) who suspects this related to an old port, Pt currently has a R sided port which shes had for approximately 6years but unsure and the ones on the left were prior to that. - Dr.Yamagata thinks this is likely chronic and possibly embedded in the vein at this point and most likely doesn't need any intervention unless symptomatic with acute DVT etc -IR will do a formal consult -I have requested CT chest records from [facility] where she usually gets admitted  4. Mild leukocytosis -likely reactive, monitor, afebrile  DVT proph: lovenox  Code Status: Full Code Family Communication: boyfriend  at bedside Disposition Plan: home when improved   HPI/Subjective: Pain not controlled rates this at 8.5-9/10  Objective: Filed Vitals:   12/31/13 0719  BP:   Pulse:   Temp:   Resp: 16    Intake/Output Summary (Last 24 hours) at 12/31/13 0816 Last data filed at 12/31/13 0606  Gross per 24 hour  Intake 888.33 ml  Output    700 ml  Net 188.33 ml   Filed Weights   12/30/13 2130  Weight: 53.298 kg (117 lb 8 oz)    Exam:   General:  AAOx3, chronically ill appearing  HEENT: port in R chest  Cardiovascular: S1S2/RRR  Respiratory: diminished BS at bases  Abdomen: soft, NT, BS  present  Musculoskeletal: no edema c/c   Data Reviewed: Basic Metabolic Panel:  Recent Labs Lab 12/30/13 1615 12/30/13 2204 12/31/13 0545  NA 143  --  140  K 3.6*  --  3.4*  CL 108  --  106  CO2 22  --  25  GLUCOSE 96  --  110*  BUN 8  --  6  CREATININE 0.47* 0.43* 0.47*  CALCIUM 8.9  --  8.4   Liver Function Tests:  Recent Labs Lab 12/31/13 0545  AST 39*  ALT 22  ALKPHOS 90  BILITOT 2.5*  PROT 6.7  ALBUMIN 3.4*   No results for input(s): LIPASE, AMYLASE in the last 168 hours. No results for input(s): AMMONIA in the last 168 hours. CBC:  Recent Labs Lab 12/30/13 1615 12/30/13 2204 12/31/13 0545  WBC 15.5* 14.0* 13.9*  NEUTROABS 9.5*  --  7.3  HGB 7.4* 6.6* 6.3*  HCT 20.7* 18.3* 17.6*  MCV 96.7 94.8 96.2  PLT 379 349 311   Cardiac Enzymes:  Recent Labs Lab 12/30/13 2204  TROPONINI <0.30   BNP (last 3 results) No results for input(s): PROBNP in the last 8760 hours. CBG: No results for input(s): GLUCAP in the last 168 hours.  No results found for this or any previous visit (from the past 240 hour(s)).   Studies: Dg Chest 2 View  12/30/2013   CLINICAL DATA:  Chest pain.  EXAM: CHEST  2 VIEW  COMPARISON:  11/16/2013.  FINDINGS:  Prior port catheter in stable position. Mediastinum and hilar structures normal. Heart size normal. No pleural effusion or pneumothorax. No acute bony abnormality. Surgical clips right upper quadrant.  IMPRESSION: 1. No acute cardiopulmonary disease. 2. Stable cardiomegaly. 3. Power port in stable anatomic position.   Electronically Signed   By: Maisie Fushomas  Register   On: 12/30/2013 16:53   Ct Angio Chest Pe W/cm &/or Wo Cm  12/31/2013   CLINICAL DATA:  Chest pain. Sickle cell workup. Shortness of breath  EXAM: CT ANGIOGRAPHY CHEST WITH CONTRAST  TECHNIQUE: Multidetector CT imaging of the chest was performed using the standard protocol during bolus administration of intravenous contrast. Multiplanar CT image reconstructions and MIPs  were obtained to evaluate the vascular anatomy.  CONTRAST:  100mL OMNIPAQUE IOHEXOL 350 MG/ML SOLN  COMPARISON:  None.  FINDINGS: Mediastinum: The heart size appears normal. There is a small pericardial effusion. The trachea appears patent and is midline. The esophagus is on unremarkable. There is a catheter fragment identified in the expected location of the left subclavian vein, image 18/series 4. The main pulmonary artery appears patent. There is no abnormal filling defects within the main pulmonary artery or its branches to suggest an acute pulmonary embolus.  Lungs/Pleura: Mild edema is identified in the lung bases. Subpleural atelectasis and scarring is noted in the lung bases.  Upper Abdomen: Spot incidental imaging through the upper abdomen is unremarkable.  Musculoskeletal: Skeletal changes of sickle cell disease identified within the spine.  Review of the MIP images confirms the above findings.  IMPRESSION: 1. No evidence for acute pulmonary embolus. 2. Mild interstitial edema, lower lobe scarring and atelectasis. 3. There is a catheter fragment identified within the left innominate vein. This was present on chest radiograph from 11/16/2013. The clinical significance is uncertain. Interventional radiology consultation is recommended.   Electronically Signed   By: Signa Kellaylor  Stroud M.D.   On: 12/31/2013 01:02    Scheduled Meds: . enoxaparin (LOVENOX) injection  40 mg Subcutaneous QHS  . folic acid  1 mg Oral Daily  . gabapentin  100 mg Oral QHS  . gabapentin  300 mg Oral QID  . HYDROmorphone PCA 2 mg/mL   Intravenous 6 times per day  . hydroxyurea  500 mg Oral Daily  . ketorolac  15 mg Intravenous 4 times per day  . pantoprazole  40 mg Oral Q1200  . topiramate  50 mg Oral BID   Continuous Infusions: . sodium chloride 100 mL/hr at 12/31/13 0650   Antibiotics Given (last 72 hours)    None      Principal Problem:   Sickle cell anemia with pain Active Problems:   Chest pain   Sickle cell  pain crisis    Time spent: 35min    [facility] LLCJOSEPH,Brittany Foster  [facility] Pager [phone]. If 7PM-7AM, please contact night-coverage at [email or web address], password [email or web address] 12/31/2013, 8:16 AM  LOS: 1 day

## 2013-12-31 NOTE — Progress Notes (Signed)
Per MD orders records were to be obtained [facility], [facility], and [facility].  Pt. Stated that she had also been seen and [facility] and had [facility]. Proper forms were obtained from patient and forms were faxed to the above facilities. Records that have been received were placed in pt.'s chart

## 2013-12-31 NOTE — H&P (Signed)
Reason for Consult: Port catheter fragment seen on recent CTA Chief Complaint: Chief Complaint  Patient presents with  . Chest Pain  . Leg Pain    Referring Physician(s): TRH  History of Present Illness: Brittany Foster is a 28 y.o. female amitted with chest pain in sickle cell crisis. CTA chest done to evaluate for PE and port a catheter fragment found in left innominate vein. Patient with current right port a catheter intact placed 5-7 years ago per patient. She has previously had left sided ports x 2 placed > 7 years ago and denies any previous history of left arm swelling/pain or LUE DVT. She has not previously been told about catheter fragment. She states chest pain is mid sternal.    Past Medical History  Diagnosis Date  . Sickle cell anemia     Past Surgical History  Procedure Laterality Date  . Tubal ligation    . Cholecystectomy    . Cesarean section      Allergies: Ultram and Morphine and related  Medications: Prior to Admission medications   Medication Sig Start Date End Date Taking? Authorizing Provider  folic acid (FOLVITE) 1 MG tablet Take 1 mg by mouth daily.   Yes Historical Provider, MD  gabapentin (NEURONTIN) 100 MG capsule Take 100 mg by mouth at bedtime.   Yes Historical Provider, MD  gabapentin (NEURONTIN) 300 MG capsule Take 300 mg by mouth 4 (four) times daily.   Yes Historical Provider, MD  hydroxyurea (HYDREA) 500 MG capsule Take 500 mg by mouth daily. May take with food to minimize GI side effects.   Yes Historical Provider, MD  Naproxen Sodium (ALEVE) 220 MG CAPS Take 660 mg by mouth every 6 (six) hours as needed (pain).   Yes Historical Provider, MD  Topiramate ER 100 MG CP24 Take 100 mg by mouth at bedtime.   Yes Historical Provider, MD    Family History  Problem Relation Age of Onset  . Sickle cell anemia Other     History   Social History  . Marital Status: Single    Spouse Name: N/A    Number of Children: N/A  . Years of Education:  N/A   Social History Main Topics  . Smoking status: Current Some Day Smoker  . Smokeless tobacco: None  . Alcohol Use: No  . Drug Use: No  . Sexual Activity: None   Other Topics Concern  . None   Social History Narrative    Review of Systems: A 12 point ROS discussed and pertinent positives are indicated in the HPI above.  All other systems are negative.  Review of Systems  Vital Signs: BP 99/66 mmHg  Pulse 83  Temp(Src) 98.1 F (36.7 C) (Oral)  Resp 16  Ht 5' (1.524 m)  Wt 117 lb 8 oz (53.298 kg)  BMI 22.95 kg/m2  SpO2 96%  Physical Exam General: A&Ox3, NAD, sitting in bed Heart: RRR without M/G/R Chest: Right anterior port intact EXT: No upper extremity edema or skin changes.   Imaging: Dg Chest 2 View  12/30/2013   CLINICAL DATA:  Chest pain.  EXAM: CHEST  2 VIEW  COMPARISON:  11/16/2013.  FINDINGS: Prior port catheter in stable position. Mediastinum and hilar structures normal. Heart size normal. No pleural effusion or pneumothorax. No acute bony abnormality. Surgical clips right upper quadrant.  IMPRESSION: 1. No acute cardiopulmonary disease. 2. Stable cardiomegaly. 3. Power port in stable anatomic position.   Electronically Signed   By: Maisie Fus  Register  On: 12/30/2013 16:53   Ct Angio Chest Pe W/cm &/or Wo Cm  12/31/2013   CLINICAL DATA:  Chest pain. Sickle cell workup. Shortness of breath  EXAM: CT ANGIOGRAPHY CHEST WITH CONTRAST  TECHNIQUE: Multidetector CT imaging of the chest was performed using the standard protocol during bolus administration of intravenous contrast. Multiplanar CT image reconstructions and MIPs were obtained to evaluate the vascular anatomy.  CONTRAST:  100mL OMNIPAQUE IOHEXOL 350 MG/ML SOLN  COMPARISON:  None.  FINDINGS: Mediastinum: The heart size appears normal. There is a small pericardial effusion. The trachea appears patent and is midline. The esophagus is on unremarkable. There is a catheter fragment identified in the expected location  of the left subclavian vein, image 18/series 4. The main pulmonary artery appears patent. There is no abnormal filling defects within the main pulmonary artery or its branches to suggest an acute pulmonary embolus.  Lungs/Pleura: Mild edema is identified in the lung bases. Subpleural atelectasis and scarring is noted in the lung bases.  Upper Abdomen: Spot incidental imaging through the upper abdomen is unremarkable.  Musculoskeletal: Skeletal changes of sickle cell disease identified within the spine.  Review of the MIP images confirms the above findings.  IMPRESSION: 1. No evidence for acute pulmonary embolus. 2. Mild interstitial edema, lower lobe scarring and atelectasis. 3. There is a catheter fragment identified within the left innominate vein. This was present on chest radiograph from 11/16/2013. The clinical significance is uncertain. Interventional radiology consultation is recommended.   Electronically Signed   By: Signa Kellaylor  Stroud M.D.   On: 12/31/2013 01:02    Labs:  CBC:  Recent Labs  11/16/13 1638 11/16/13 1728 12/30/13 1615 12/30/13 2204 12/31/13 0545  WBC 11.4*  --  15.5* 14.0* 13.9*  HGB 8.4* 8.8* 7.4* 6.6* 6.3*  HCT 22.9* 26.0* 20.7* 18.3* 17.6*  PLT 225  --  379 349 311    COAGS: No results for input(s): INR, APTT in the last 8760 hours.  BMP:  Recent Labs  11/16/13 1728 12/30/13 1615 12/30/13 2204 12/31/13 0545  NA 142 143  --  140  K 3.2* 3.6*  --  3.4*  CL 113* 108  --  106  CO2  --  22  --  25  GLUCOSE 95 96  --  110*  BUN 4* 8  --  6  CALCIUM  --  8.9  --  8.4  CREATININE 0.70 0.47* 0.43* 0.47*  GFRNONAA  --  >90 >90 >90  GFRAA  --  >90 >90 >90    LIVER FUNCTION TESTS:  Recent Labs  12/31/13 0545  BILITOT 2.5*  AST 39*  ALT 22  ALKPHOS 90  PROT 6.7  ALBUMIN 3.4*    Assessment and Plan: Admitted with chest pain in sickle cell crisis  Port a catheter fragment found in left innominate vein on CTA 10/31 Right port a catheter intact placed  5-7 years ago per patient History of left sided ports x 2 placed > 7 years ago No previous history of left arm swelling/pain or LUE DVT Discussed findings with Dr. Fredia SorrowYamagata today who recommends no intervention at this time as catheter fragment is chronic and risk of migration or contributing to DVT is low given fragment is most likely fully endothelialized. If the patient would develop left arm swelling or pain IR would recommend a venous duplex US to evaluate for DVT. I have discussed risks and benefits of catheter fragment retrieval with the patient today and she agrees with no attempt  at retrieval at this time.      Thank you for this interesting consult.  I greatly enjoyed meeting Richmond CampbellMiranda Kozar and look forward to participating in their care.   I spent a total of 20 minutes face to face in clinical consultation, greater than 50% of which was counseling/coordinating care  Signed: Berneta LevinsMORGAN, Hope Brandenburger D 12/31/2013, 9:27 AM

## 2013-12-31 NOTE — Progress Notes (Addendum)
IR PA aware of request for consult regarding port a catheter fragment seen within the left innominate vein on CTA 10/31 this is most likely chronic and will not require emergent retrieval, however IR will evaluate patient. This was d/w Dr. Fredia SorrowYamagata who agrees with the above.  Pattricia BossKoreen Gabriella Woodhead PA-C Interventional Radiology  12/31/13  8:37 AM

## 2014-01-01 DIAGNOSIS — R079 Chest pain, unspecified: Secondary | ICD-10-CM | POA: Insufficient documentation

## 2014-01-01 LAB — COMPREHENSIVE METABOLIC PANEL
ALT: 26 U/L (ref 0–35)
AST: 50 U/L — ABNORMAL HIGH (ref 0–37)
Albumin: 3.4 g/dL — ABNORMAL LOW (ref 3.5–5.2)
Alkaline Phosphatase: 89 U/L (ref 39–117)
Anion gap: 13 (ref 5–15)
BUN: 6 mg/dL (ref 6–23)
CHLORIDE: 107 meq/L (ref 96–112)
CO2: 21 mEq/L (ref 19–32)
Calcium: 8.5 mg/dL (ref 8.4–10.5)
Creatinine, Ser: 0.49 mg/dL — ABNORMAL LOW (ref 0.50–1.10)
GFR calc Af Amer: >90 mL/min (ref >90)
GFR calc non Af Amer: >90 mL/min (ref >90)
GLUCOSE: 103 mg/dL — AB (ref 70–99)
POTASSIUM: 4.1 meq/L (ref 3.7–5.3)
Sodium: 141 mEq/L (ref 137–147)
TOTAL PROTEIN: 6.6 g/dL (ref 6.0–8.3)
Total Bilirubin: 3.1 mg/dL — ABNORMAL HIGH (ref 0.3–1.2)

## 2014-01-01 LAB — CBC
HCT: 18.1 % — ABNORMAL LOW (ref 36.0–46.0)
HEMOGLOBIN: 6.2 g/dL — AB (ref 12.0–15.0)
MCH: 34.1 pg — ABNORMAL HIGH (ref 26.0–34.0)
MCHC: 34.3 g/dL (ref 30.0–36.0)
MCV: 99.5 fL (ref 78.0–100.0)
Platelets: 316 10*3/uL (ref 150–400)
RBC: 1.82 MIL/uL — ABNORMAL LOW (ref 3.87–5.11)
RDW: 26.5 % — ABNORMAL HIGH (ref 11.5–15.5)
WBC: 15.6 10*3/uL — AB (ref 4.0–10.5)

## 2014-01-01 MED ORDER — HYDROMORPHONE 2 MG/ML HIGH CONCENTRATION IV PCA SOLN
INTRAVENOUS | Status: DC
Start: 2014-01-02 — End: 2014-01-02
  Administered 2014-01-01: 22:00:00 via INTRAVENOUS
  Administered 2014-01-02: 7.5 mg via INTRAVENOUS
  Administered 2014-01-02: 6.3 mg via INTRAVENOUS
  Administered 2014-01-02: 5.15 mg via INTRAVENOUS
  Administered 2014-01-02: 9.64 mg via INTRAVENOUS

## 2014-01-01 MED ORDER — SODIUM CHLORIDE 0.45 % IV SOLN
INTRAVENOUS | Status: DC
  Administered 2014-01-02: via INTRAVENOUS

## 2014-01-01 MED ORDER — HYDROMORPHONE HCL 1 MG/ML IJ SOLN
0.5000 mg | INTRAMUSCULAR | Status: DC | PRN
  Administered 2014-01-02: 0.5 mg via INTRAVENOUS
  Filled 2014-01-01 (×2): qty 1

## 2014-01-01 MED ORDER — KETOROLAC TROMETHAMINE 15 MG/ML IJ SOLN
30.0000 mg | Freq: Four times a day (QID) | INTRAMUSCULAR | Status: DC
  Administered 2014-01-01 – 2014-01-02 (×4): 30 mg via INTRAVENOUS
  Filled 2014-01-01 (×6): qty 2

## 2014-01-01 NOTE — Care Management Note (Signed)
CARE MANAGEMENT NOTE 01/01/2014  Patient:  Brittany Foster,Brittany Foster   Account Number:  [id number]  Date Initiated:  01/01/2014  Documentation initiated by:  Sandford CrazeLEMENTS,Ghadeer Kastelic  Subjective/Objective Assessment:   28 [facility]     Action/Plan:   From home with children   Anticipated DC Date:  01/03/2014   Anticipated DC Plan:  HOME/SELF CARE      DC Planning Services  CM consult      Choice offered to / List presented to:             Status of service:  In process, will continue to follow Medicare Important Message given?   (If response is "NO", the following Medicare IM given date fields will be blank) Date Medicare IM given:   Medicare IM given by:   Date Additional Medicare IM given:   Additional Medicare IM given by:    Discharge Disposition:    Per UR Regulation:  Reviewed for med. necessity/level of care/duration of stay  If discussed at Long Length of Stay Meetings, dates discussed:    Comments:  01/01/14 Sandford CrazeNora Chidubem Chaires RN,BSN,NCM chart reviewed and CM following for DC needs.

## 2014-01-01 NOTE — Progress Notes (Signed)
SICKLE CELL SERVICE PROGRESS NOTE  Brittany CampbellMiranda Foster WUJ:[id number] DOB: 03/10/1985 DOA: 12/30/2013 PCP: Pcp Not In System  Assessment/Plan: Principal Problem:   Sickle cell anemia with pain Active Problems:   Chest pain   Sickle cell pain crisis   Abnormal CT scan, chest   Pain in the chest  1. Hb SS: Pt has used 49.6 mg with 70/62 demands/deliveries on the PCA in the last 24 hours. I will decrease PCA to 0.5 mg and increase Toradol to 30 mg. I will also give clinician assisted doses at 1 mg q 3 hours PRN. Continue Gabapentin.  Pt refusing K-pad for topical heat.  2. H/O migraine Headaches: Pt on Topomax for prophylaxis. Continue. 3. Sickle Cell disease: Pt on sub-therapeutic dose of Hydrea (9.4 mg/kg). It is unclear why she is on such a small dose but will defer to her PMD to adjust her dose. Continue Folic Acid. 4.   Code Status: Full Code Family Communication: N/A Disposition Plan: Not yet ready for discharge  [name],[name]  Pager [phone]. If 7PM-7AM, please contact night-coverage.  01/01/2014, 11:16 AM  LOS: 2 days   Brief narrative: Brittany Foster is a 28 y.o. female with history of sickle cell anemia presents to the ER with complaints of increasing low back pain and chest pain. Patient has been having these symptoms for last 2 days with mild shortness of breath denies any associated fever chills or productive cough. In the ER patient was found to be afebrile chest x-ray does not show anything acute. Patient has been admitted for sickle cell pain crisis. Patient's cardiac markers and EKG are unremarkable. D-dimer is mildly elevated and CT angiography of the chest is pending.   Consultants:  None  Procedures:  None  Antibiotics:  None  HPI/Subjective: Pt states that she has a baseline pain of 0-2/10 and on many days does not require analgesics. She reports that her pain began 5 days ago and she unsuccessfully treated it with oral analgesics. Thus she came to the  hospital. Pt is refusing the topical heat and Toradol. I have explained to the patient the significance NSAIDs in treating the inflammatory component of pain as well as treating the nociceptive and neuropathic pain. Last BM yesterday.  Objective: Filed Vitals:   01/01/14 0415 01/01/14 0426 01/01/14 0430 01/01/14 0800  BP: 95/54     Pulse: 80     Temp: 98.6 F (37 C)     TempSrc: Oral     Resp: 13 18 16 14   Height:      Weight:      SpO2: 93% 92% 92% 96%   Weight change:   Intake/Output Summary (Last 24 hours) at 01/01/14 1116 Last data filed at 01/01/14 0600  Gross per 24 hour  Intake    269 ml  Output    702 ml  Net   -433 ml    General: Alert, awake, oriented x3, in no acute distress.  HEENT: [place]/AT PEERL, EOMI, anicteric. Neck: Trachea midline,  no masses, no thyromegal,y no JVD, no carotid bruit OROPHARYNX:  Moist, No exudate/ erythema/lesions.  Heart: Regular rate and rhythm, without murmurs, rubs, gallops, PMI non-displaced, no heaves or thrills on palpation.  Lungs: Clear to auscultation, no wheezing or rhonchi noted. Abdomen: Soft, nontender, nondistended, positive bowel sounds, no masses no hepatosplenomegaly noted.  Neuro: No focal neurological deficits noted cranial nerves II through XII grossly intact. Unable to assess. Musculoskeletal: No warm swelling or erythema around joints, no spinal tenderness noted. Psychiatric:  Patient alert and oriented x3, good insight and cognition, good recent to remote recall.   Data Reviewed: Basic Metabolic Panel:  Recent Labs Lab 12/30/13 1615 12/30/13 2204 12/31/13 0545 01/01/14 0645  NA 143  --  140 141  K 3.6*  --  3.4* 4.1  CL 108  --  106 107  CO2 22  --  25 21  GLUCOSE 96  --  110* 103*  BUN 8  --  6 6  CREATININE 0.47* 0.43* 0.47* 0.49*  CALCIUM 8.9  --  8.4 8.5   Liver Function Tests:  Recent Labs Lab 12/31/13 0545 01/01/14 0645  AST 39* 50*  ALT 22 26  ALKPHOS 90 89  BILITOT 2.5* 3.1*  PROT 6.7 6.6   ALBUMIN 3.4* 3.4*   No results for input(s): LIPASE, AMYLASE in the last 168 hours. No results for input(s): AMMONIA in the last 168 hours. CBC:  Recent Labs Lab 12/30/13 1615 12/30/13 2204 12/31/13 0545 01/01/14 0645  WBC 15.5* 14.0* 13.9* 15.6*  NEUTROABS 9.5*  --  7.3  --   HGB 7.4* 6.6* 6.3* 6.2*  HCT 20.7* 18.3* 17.6* 18.1*  MCV 96.7 94.8 96.2 99.5  PLT 379 349 311 316   Cardiac Enzymes:  Recent Labs Lab 12/30/13 2204  TROPONINI <0.30   BNP (last 3 results) No results for input(s): PROBNP in the last 8760 hours. CBG: No results for input(s): GLUCAP in the last 168 hours.  No results found for this or any previous visit (from the past 240 hour(s)).   Studies: Dg Chest 2 View  12/30/2013   CLINICAL DATA:  Chest pain.  EXAM: CHEST  2 VIEW  COMPARISON:  11/16/2013.  FINDINGS: Prior port catheter in stable position. Mediastinum and hilar structures normal. Heart size normal. No pleural effusion or pneumothorax. No acute bony abnormality. Surgical clips right upper quadrant.  IMPRESSION: 1. No acute cardiopulmonary disease. 2. Stable cardiomegaly. 3. Power port in stable anatomic position.   Electronically Signed   By: Maisie Fushomas  Register   On: 12/30/2013 16:53   Ct Angio Chest Pe W/cm &/or Wo Cm  12/31/2013   CLINICAL DATA:  Chest pain. Sickle cell workup. Shortness of breath  EXAM: CT ANGIOGRAPHY CHEST WITH CONTRAST  TECHNIQUE: Multidetector CT imaging of the chest was performed using the standard protocol during bolus administration of intravenous contrast. Multiplanar CT image reconstructions and MIPs were obtained to evaluate the vascular anatomy.  CONTRAST:  100mL OMNIPAQUE IOHEXOL 350 MG/ML SOLN  COMPARISON:  None.  FINDINGS: Mediastinum: The heart size appears normal. There is a small pericardial effusion. The trachea appears patent and is midline. The esophagus is on unremarkable. There is a catheter fragment identified in the expected location of the left subclavian  vein, image 18/series 4. The main pulmonary artery appears patent. There is no abnormal filling defects within the main pulmonary artery or its branches to suggest an acute pulmonary embolus.  Lungs/Pleura: Mild edema is identified in the lung bases. Subpleural atelectasis and scarring is noted in the lung bases.  Upper Abdomen: Spot incidental imaging through the upper abdomen is unremarkable.  Musculoskeletal: Skeletal changes of sickle cell disease identified within the spine.  Review of the MIP images confirms the above findings.  IMPRESSION: 1. No evidence for acute pulmonary embolus. 2. Mild interstitial edema, lower lobe scarring and atelectasis. 3. There is a catheter fragment identified within the left innominate vein. This was present on chest radiograph from 11/16/2013. The clinical significance is uncertain. Interventional radiology  consultation is recommended.   Electronically Signed   By: Signa Kell M.D.   On: 12/31/2013 01:02    Scheduled Meds: . enoxaparin (LOVENOX) injection  40 mg Subcutaneous QHS  . folic acid  1 mg Oral Daily  . gabapentin  100 mg Oral QHS  . gabapentin  300 mg Oral QID  . HYDROmorphone PCA 2 mg/mL   Intravenous 6 times per day  . hydroxyurea  500 mg Oral Daily  . ketorolac  30 mg Intravenous 4 times per day  . pantoprazole  40 mg Oral Q1200  . topiramate  50 mg Oral BID   Time spent 40 minutes.

## 2014-01-02 LAB — CBC WITH DIFFERENTIAL/PLATELET
BASOS PCT: 0 % (ref 0–1)
Basophils Absolute: 0 10*3/uL (ref 0.0–0.1)
Eosinophils Absolute: 0.3 10*3/uL (ref 0.0–0.7)
Eosinophils Relative: 2 % (ref 0–5)
HEMATOCRIT: 18.1 % — AB (ref 36.0–46.0)
HEMOGLOBIN: 6.4 g/dL — AB (ref 12.0–15.0)
Lymphocytes Relative: 29 % (ref 12–46)
Lymphs Abs: 4.3 10*3/uL — ABNORMAL HIGH (ref 0.7–4.0)
MCH: 35.6 pg — ABNORMAL HIGH (ref 26.0–34.0)
MCHC: 35.4 g/dL (ref 30.0–36.0)
MCV: 100.6 fL — ABNORMAL HIGH (ref 78.0–100.0)
MONO ABS: 1.2 10*3/uL — AB (ref 0.1–1.0)
Monocytes Relative: 8 % (ref 3–12)
NEUTROS ABS: 9 10*3/uL — AB (ref 1.7–7.7)
NRBC: 8 /100{WBCs} — AB
Neutrophils Relative %: 61 % (ref 43–77)
Platelets: 307 10*3/uL (ref 150–400)
RBC: 1.8 MIL/uL — ABNORMAL LOW (ref 3.87–5.11)
RDW: 27.1 % — ABNORMAL HIGH (ref 11.5–15.5)
WBC: 14.8 10*3/uL — ABNORMAL HIGH (ref 4.0–10.5)

## 2014-01-02 LAB — LACTATE DEHYDROGENASE: LDH: 329 U/L — ABNORMAL HIGH (ref 94–250)

## 2014-01-02 MED ORDER — HYDROMORPHONE HCL 1 MG/ML IJ SOLN
1.0000 mg | INTRAMUSCULAR | Status: DC | PRN
  Administered 2014-01-02 (×2): 1 mg via INTRAVENOUS
  Filled 2014-01-02 (×2): qty 1

## 2014-01-02 MED ORDER — KETOROLAC TROMETHAMINE 30 MG/ML IJ SOLN
30.0000 mg | Freq: Four times a day (QID) | INTRAMUSCULAR | Status: DC
  Administered 2014-01-02 – 2014-01-03 (×4): 30 mg via INTRAVENOUS
  Filled 2014-01-02 (×8): qty 1

## 2014-01-02 MED ORDER — INFLUENZA VAC SPLIT QUAD 0.5 ML IM SUSY
0.5000 mL | PREFILLED_SYRINGE | INTRAMUSCULAR | Status: DC
  Filled 2014-01-02 (×2): qty 0.5

## 2014-01-02 MED ORDER — HYDROMORPHONE 2 MG/ML HIGH CONCENTRATION IV PCA SOLN
INTRAVENOUS | Status: DC
  Administered 2014-01-02: 7.46 mg via INTRAVENOUS
  Administered 2014-01-02: 7.2 mg via INTRAVENOUS
  Administered 2014-01-03: 4.71 mg via INTRAVENOUS
  Filled 2014-01-02: qty 25

## 2014-01-02 NOTE — Progress Notes (Signed)
SICKLE CELL SERVICE PROGRESS NOTE  Brittany Foster JXB:[id number] DOB: 11/23/1985 DOA: 12/30/2013 PCP: Pcp Not In System  Assessment/Plan: Principal Problem:   Sickle cell anemia with pain Active Problems:   Chest pain   Sickle cell pain crisis   Abnormal CT scan, chest   Pain in the chest  1. Hb SS: Pt has used 46.6 mg with 84/64 demands/deliveries on the PCA in the last 24 hours. She perceives that she is using the PCA excessively reports that her decrease in pain is minimal with the PCA. I will increase her bolus dose to 0.6 mg, continue Toradol 30 mg and increase her clinician assisted dose to 1 mg. I will also give clinician assisted doses at 1 mg q 3 hours PRN. Continue Gabapentin.  Pt continues to refuse K-pad for topical heat.  2. Anemia: Pt reports her baseline Hb at 8 but it appears that this is after transfusions and that her Hb actually is baseline of 7-7.5. Her decrease in Hb is within the expected range during Foster crisis and with hemodilution. Will continue to monitor. 3. H/O migraine Headaches: Pt on Topomax for prophylaxis. Continue. 4. Sickle Cell disease: Pt on sub-therapeutic dose of Hydrea (9.4 mg/kg). It is unclear why she is on such Foster small dose but will defer to her PMD to adjust her dose. Continue Folic Acid. 5. Gait abnormality: Pt states that she is unable to walk because of her pain but is ambulating to and from the bathroom. I will ask PT to evaluate her gait and any needs for assistive device. 6. Psychosocial: Pt has Foster very flat affect and does not engage in conversation.She essentially has Foster list of what mediations she wants and how they should be given.She has very poor insight into her disease process and is unwilling to be educated on the disease. When asked about her disease history she reports that she gets acute chest "all the time". However Foster [facility] does not support this claim. She then states that it was at [place] and  [place] that she always had acute chest syndrome. These questions were prompted from the patients claim that she had to be transfused if her Hb dropped below 8. She is unable to give any information on her Hb levels prior to transfusion.   Code Status: Full Code Family Communication: N/Foster Disposition Plan: Not yet ready for discharge  Brittany Foster.  Pager [phone]. If 7PM-7AM, please contact night-coverage.  01/02/2014, 5:51 PM  LOS: 3 days   Brief narrative: Brittany CampbellMiranda Charney is Foster 28 y.o. female with history of sickle cell anemia presents to the ER with complaints of increasing low back pain and chest pain. Patient has been having these symptoms for last 2 days with mild shortness of breath denies any associated fever chills or productive cough. In the ER patient was found to be afebrile chest x-ray does not show anything acute. Patient has been admitted for sickle cell pain crisis. Patient's cardiac markers and EKG are unremarkable. D-dimer is mildly elevated and CT angiography of the chest is pending.   Consultants:  None  Procedures:  None  Antibiotics:  None  HPI/Subjective: Pt states that she has Foster baseline pain of 0-2/10 and on many days does not require analgesics. She reports that her pain began 5 days ago and she unsuccessfully treated it with oral analgesics. Thus she came to the hospital. Pt is refusing the topical heat and Toradol. I have explained to the  patient the significance NSAIDs in treating the inflammatory component of pain as well as treating the nociceptive and neuropathic pain. Last BM yesterday.  Objective: Filed Vitals:   01/02/14 0801 01/02/14 0920 01/02/14 1201 01/02/14 1415  BP:  100/56  106/60  Pulse:  79  74  Temp:  98.7 F (37.1 C)  98.3 F (36.8 C)  TempSrc:  Oral  Oral  Resp: 11 14 18 17   Height:      Weight:      SpO2: 100% 100% 100% 100%   Weight change:   Intake/Output Summary (Last 24 hours) at 01/02/14 1751 Last data filed at  01/02/14 1300  Gross per 24 hour  Intake    960 ml  Output   1700 ml  Net   -740 ml    General: Alert, awake, oriented x3, in no acute distress.  HEENT: [place]/AT PEERL, EOMI, anicteric. Neck: Trachea midline,  no masses, no thyromegal,y no JVD, no carotid bruit OROPHARYNX:  Moist, No exudate/ erythema/lesions.  Heart: Regular rate and rhythm, without murmurs, rubs, gallops, PMI non-displaced, no heaves or thrills on palpation.  Lungs: Clear to auscultation, no wheezing or rhonchi noted. Abdomen: Soft, nontender, nondistended, positive bowel sounds, no masses no hepatosplenomegaly noted.  Neuro: No focal neurological deficits noted cranial nerves II through XII grossly intact. Unable to assess. Musculoskeletal: No warm swelling or erythema around joints, no spinal tenderness noted. Psychiatric: Patient alert and oriented x3, poor insight into disease process.  Data Reviewed: Basic Metabolic Panel:  Recent Labs Lab 12/30/13 1615 12/30/13 2204 12/31/13 0545 01/01/14 0645  NA 143  --  140 141  K 3.6*  --  3.4* 4.1  CL 108  --  106 107  CO2 22  --  25 21  GLUCOSE 96  --  110* 103*  BUN 8  --  6 6  CREATININE 0.47* 0.43* 0.47* 0.49*  CALCIUM 8.9  --  8.4 8.5   Liver Function Tests:  Recent Labs Lab 12/31/13 0545 01/01/14 0645  AST 39* 50*  ALT 22 26  ALKPHOS 90 89  BILITOT 2.5* 3.1*  PROT 6.7 6.6  ALBUMIN 3.4* 3.4*   No results for input(s): LIPASE, AMYLASE in the last 168 hours. No results for input(s): AMMONIA in the last 168 hours. CBC:  Recent Labs Lab 12/30/13 1615 12/30/13 2204 12/31/13 0545 01/01/14 0645 01/02/14 0550  WBC 15.5* 14.0* 13.9* 15.6* 14.8*  NEUTROABS 9.5*  --  7.3  --  9.0*  HGB 7.4* 6.6* 6.3* 6.2* 6.4*  HCT 20.7* 18.3* 17.6* 18.1* 18.1*  MCV 96.7 94.8 96.2 99.5 100.6*  PLT 379 349 311 316 307   Cardiac Enzymes:  Recent Labs Lab 12/30/13 2204  TROPONINI <0.30   BNP (last 3 results) No results for input(s): PROBNP in the last 8760  hours. CBG: No results for input(s): GLUCAP in the last 168 hours.  No results found for this or any previous visit (from the past 240 hour(s)).   Studies: Dg Chest 2 View  12/30/2013   CLINICAL DATA:  Chest pain.  EXAM: CHEST  2 VIEW  COMPARISON:  11/16/2013.  FINDINGS: Prior port catheter in stable position. Mediastinum and hilar structures normal. Heart size normal. No pleural effusion or pneumothorax. No acute bony abnormality. Surgical clips right upper quadrant.  IMPRESSION: 1. No acute cardiopulmonary disease. 2. Stable cardiomegaly. 3. Power port in stable anatomic position.   Electronically Signed   By: Maisie Fushomas  Register   On: 12/30/2013 16:53  Ct Angio Chest Pe W/cm &/or Wo Cm  12/31/2013   CLINICAL DATA:  Chest pain. Sickle cell workup. Shortness of breath  EXAM: CT ANGIOGRAPHY CHEST WITH CONTRAST  TECHNIQUE: Multidetector CT imaging of the chest was performed using the standard protocol during bolus administration of intravenous contrast. Multiplanar CT image reconstructions and MIPs were obtained to evaluate the vascular anatomy.  CONTRAST:  OMNIPAQUE IOHEXOL 350 MG/ML SOLN  COMPARISON:  None.  FINDINGS: Mediastinum: The heart size appears normal. There is Foster small pericardial effusion. The trachea appears patent and is midline. The esophagus is on unremarkable. There is Foster catheter fragment identified in the expected location of the left subclavian vein, image 18/series 4. The main pulmonary artery appears patent. There is no abnormal filling defects within the main pulmonary artery or its branches to suggest an acute pulmonary embolus.  Lungs/Pleura: Mild edema is identified in the lung bases. Subpleural atelectasis and scarring is noted in the lung bases.  Upper Abdomen: Spot incidental imaging through the upper abdomen is unremarkable.  Musculoskeletal: Skeletal changes of sickle cell disease identified within the spine.  Review of the MIP images confirms the above findings.   IMPRESSION: 1. No evidence for acute pulmonary embolus. 2. Mild interstitial edema, lower lobe scarring and atelectasis. 3. There is Foster catheter fragment identified within the left innominate vein. This was present on chest radiograph from 11/16/2013. The clinical significance is uncertain. Interventional radiology consultation is recommended.   Electronically Signed   By: Signa Kell M.D.   On: 12/31/2013 01:02    Scheduled Meds: . enoxaparin (LOVENOX) injection  40 mg Subcutaneous QHS  . folic acid  1 mg Oral Daily  . gabapentin  100 mg Oral QHS  . gabapentin  300 mg Oral QID  . HYDROmorphone PCA 2 mg/mL   Intravenous 6 times per day  . hydroxyurea  500 mg Oral Daily  . ketorolac  30 mg Intravenous 4 times per day  . pantoprazole  40 mg Oral Q1200  . topiramate  50 mg Oral BID   Time spent 35 minutes.

## 2014-01-02 NOTE — Plan of Care (Signed)
Problem: Consults Goal: Sickle Cell Crisis Patient Education Outcome: Completed/Met Date Met:  01/02/14  Problem: Phase I Progression Outcomes Goal: Pt. aware of pain management plan Outcome: Progressing  Problem: Phase II Progression Outcomes Goal: Tolerating diet Outcome: Completed/Met Date Met:  01/02/14

## 2014-01-03 LAB — CBC WITH DIFFERENTIAL/PLATELET
BASOS PCT: 0 % (ref 0–1)
Basophils Absolute: 0 10*3/uL (ref 0.0–0.1)
EOS PCT: 2 % (ref 0–5)
Eosinophils Absolute: 0.2 10*3/uL (ref 0.0–0.7)
HEMATOCRIT: 18.7 % — AB (ref 36.0–46.0)
HEMOGLOBIN: 6.6 g/dL — AB (ref 12.0–15.0)
LYMPHS ABS: 3.9 10*3/uL (ref 0.7–4.0)
Lymphocytes Relative: 32 % (ref 12–46)
MCH: 36.1 pg — AB (ref 26.0–34.0)
MCHC: 35.3 g/dL (ref 30.0–36.0)
MCV: 102.2 fL — AB (ref 78.0–100.0)
Monocytes Absolute: 0.8 10*3/uL (ref 0.1–1.0)
Monocytes Relative: 7 % (ref 3–12)
Neutro Abs: 7.2 10*3/uL (ref 1.7–7.7)
Neutrophils Relative %: 59 % (ref 43–77)
Platelets: 287 10*3/uL (ref 150–400)
RBC: 1.83 MIL/uL — ABNORMAL LOW (ref 3.87–5.11)
RDW: 26.4 % — ABNORMAL HIGH (ref 11.5–15.5)
WBC: 12.1 10*3/uL — ABNORMAL HIGH (ref 4.0–10.5)
nRBC: 10 /100 WBC — ABNORMAL HIGH

## 2014-01-03 LAB — LACTATE DEHYDROGENASE: LDH: 308 U/L — ABNORMAL HIGH (ref 94–250)

## 2014-01-03 MED ORDER — HEPARIN SOD (PORK) LOCK FLUSH 100 UNIT/ML IV SOLN
500.0000 [IU] | INTRAVENOUS | Status: DC | PRN
  Filled 2014-01-03: qty 5

## 2014-01-03 MED ORDER — SODIUM CHLORIDE 0.9 % IJ SOLN: 10.0000 mL | INTRAMUSCULAR | Status: DC | PRN

## 2014-01-03 MED ORDER — HEPARIN SOD (PORK) LOCK FLUSH 100 UNIT/ML IV SOLN: 250.0000 [IU] | INTRAVENOUS | Status: DC | PRN

## 2014-01-03 NOTE — Progress Notes (Signed)
PT Cancellation Note  Patient Details Name: Richmond CampbellMiranda Obeso MRN: [id number] DOB: 08/10/1985   Cancelled Treatment:    Reason Eval/Treat Not Completed: Other (comment) (Pt just d/c from hospital)   [facility] Center[facility]ITH,Talyn Eddie LUBECK 01/03/2014, 11:46 AM

## 2014-01-03 NOTE — Progress Notes (Signed)
Went to speak with patient with Dr. Ashley RoyaltyMatthews.  She spoke to patient and [facility].  While in the middle of telling the patient things, the patient took off her oxygen and the O2 sensor then got up out of the bed and unhooked her port and dropped it to the floor and went to the bathroom. Dr Ashley RoyaltyMatthews was trying to speak to her and she just shut the bathroom door.  She told Dr. Ashley RoyaltyMatthews that she could discharge her today.  Rubye OaksPerkia, Keileigh Vahey Jean

## 2014-01-03 NOTE — Discharge Summary (Signed)
Brittany Foster MRN: [id number] DOB/AGE: 29/03/1985 29 y.o.  Admit date: 12/30/2013 Discharge date: 01/03/2014  Primary Care Physician:  Brittany MuldersAna Frunza, MD   Discharge Diagnoses:   Patient Active Problem List   Diagnosis Date Noted  . Pain in the chest   . Abnormal CT scan, chest   . Sickle cell anemia with pain 12/30/2013  . Chest pain 12/30/2013  . Sickle cell pain crisis 12/30/2013    DISCHARGE MEDICATION:   Medication List    TAKE these medications        ALEVE 220 MG Caps  Generic drug:  Naproxen Sodium  Take 660 mg by mouth every 6 (six) hours as needed (pain).     folic acid 1 MG tablet  Commonly known as:  FOLVITE  Take 1 mg by mouth daily.     gabapentin 300 MG capsule  Commonly known as:  NEURONTIN  Take 300 mg by mouth 4 (four) times daily.     gabapentin 100 MG capsule  Commonly known as:  NEURONTIN  Take 100 mg by mouth at bedtime.     hydroxyurea 500 MG capsule  Commonly known as:  HYDREA  Take 500 mg by mouth daily. May take with food to minimize GI side effects.     Topiramate ER 100 MG Cp24  Take 100 mg by mouth at bedtime.          Consults:     SIGNIFICANT DIAGNOSTIC STUDIES:  Dg Chest 2 View  12/30/2013   CLINICAL DATA:  Chest pain.  EXAM: CHEST  2 VIEW  COMPARISON:  11/16/2013.  FINDINGS: Prior port catheter in stable position. Mediastinum and hilar structures normal. Heart size normal. No pleural effusion or pneumothorax. No acute bony abnormality. Surgical clips right upper quadrant.  IMPRESSION: 1. No acute cardiopulmonary disease. 2. Stable cardiomegaly. 3. Power port in stable anatomic position.   Electronically Signed   By: Brittany Foster  Register   On: 12/30/2013 16:53   Ct Angio Chest Pe W/cm &/or Wo Cm  12/31/2013   CLINICAL DATA:  Chest pain. Sickle cell workup. Shortness of breath  EXAM: CT ANGIOGRAPHY CHEST WITH CONTRAST  TECHNIQUE: Multidetector CT imaging of the chest was performed using the standard protocol during bolus administration  of intravenous contrast. Multiplanar CT image reconstructions and MIPs were obtained to evaluate the vascular anatomy.  CONTRAST:  100mL OMNIPAQUE IOHEXOL 350 MG/ML SOLN  COMPARISON:  None.  FINDINGS: Mediastinum: The heart size appears normal. There is a small pericardial effusion. The trachea appears patent and is midline. The esophagus is on unremarkable. There is a catheter fragment identified in the expected location of the left subclavian vein, image 18/series 4. The main pulmonary artery appears patent. There is no abnormal filling defects within the main pulmonary artery or its branches to suggest an acute pulmonary embolus.  Lungs/Pleura: Mild edema is identified in the lung bases. Subpleural atelectasis and scarring is noted in the lung bases.  Upper Abdomen: Spot incidental imaging through the upper abdomen is unremarkable.  Musculoskeletal: Skeletal changes of sickle cell disease identified within the spine.  Review of the MIP images confirms the above findings.  IMPRESSION: 1. No evidence for acute pulmonary embolus. 2. Mild interstitial edema, lower lobe scarring and atelectasis. 3. There is a catheter fragment identified within the left innominate vein. This was present on chest radiograph from 11/16/2013. The clinical significance is uncertain. Interventional radiology consultation is recommended.   Electronically Signed   By: Brittany Kellaylor  Foster M.D.   On:  12/31/2013 01:02     No results found for this or any previous visit (from the past 240 hour(s)).  BRIEF ADMITTING H & P: Brittany Foster is a 28 y.o. female with history of sickle cell anemia presents to the ER with complaints of increasing low back pain and chest pain. Patient has been having these symptoms for last 2 days with mild shortness of breath denies any associated fever chills or productive cough. In the ER patient was found to be afebrile chest x-ray does not show anything acute. Patient has been admitted for sickle cell pain crisis.  Patient's cardiac markers and EKG are unremarkable. D-dimer is mildly elevated and CT angiography of the chest is pending.    [name] is a patient with Hb SS who is normally seen in [place] at [facility] where she is seen by Dr. Rozelle Foster. Pt presented in crisis and was admitted for management of her cri    Hospital Course:  Present on Admission:  . Sickle cell anemia with crisis: Pt was treated with Dilaudid via PCA however she was not using the PCA maximally. She was also given clinician assisted doses of Dilaudid but continued to request higher bolus doses of Dilaudid. All the while, refusing Toradol and stating that Toradol and Neurontin don't work. I explained to patient the importance of frequent dosing of analgesics as opiates are rapidly metabolized during crisis. However when I discussed transitioning to oral analgesics because the low use of PCA was an indication of lack of necessity, pt stated that I could discharge her. She then got up and disconnected her PCA from her port and despite stating yesterday that she was unable to ambulate, she got up and walked to the bathroom. Pt is clearly functional with her level of pain and at this time has no acute medical risk. She is alert and oriented x 3 and has the capacity to make her decisions. She is being discharged to follow up with her PMD.  . Chest pain: Pt c/o chest pain and had a CT angiogram performed which did not show PE or acute pneumonic process. However, it did show port fragments in the left innominate vein. She was evaluated by Interventional Radiologist whose recommendations were against any intervention at this time given that it is likely epithelialized and that trying to retreive it would be of little benefit and pose an increased risk of venous injury or thrombosis.. Pt did not develop any swelling of the arm during this hospitalization   . Anemia: Pt had a Hb which  remained stable at about 6.6. She had no evidence of increased hemolysis and no active bleeding. Pt has received multiple transfusions and although a review of her records show Hb around 8 g/dL it is usually after a transfusion and there is no obvious clinical indication for transfusion (i.e.. Acute Chest, CVA, Multiorgan system failure etc.) It is not clinically appropriate to transfuse a patient for crisis unless there is more than a 2 g/dL decrease in Hb which is not the case for this patient.  . Hypotension: Pt was noted to have lower BP than the average population. However patient reports and a review of her records in Care Everywhere confirms that her normal SBP is in the 90's and can go as low as 80's with pain IV opiates.   Disposition and Follow-up:  Pt to follow-up with her PMD as needed     Discharge Instructions  Activity as tolerated - No restrictions    Complete by:  As directed      Diet general    Complete by:  As directed            DISCHARGE EXAM:  General: Alert, awake, oriented x3, in no acute distress. Actually pretty boisterous this morning Vital Signs: BP 95/55, HR 75, T 98.4 F (36.9 C), temperature source Oral, RR 16, height 5' (1.524 m), weight 121 lb 14.4 oz (55.293 kg), SpO2 100 %. HEENT: [place]/AT, EOMI, anicteric. OROPHARYNX: Moist, No exudate/ erythema/lesions.  Heart: Regular rate and rhythm, without murmurs, rubs, gallops.  Lungs: Clear to auscultation, no wheezing or rhonchi noted. Abdomen: Soft, nontender, nondistended, positive bowel sounds, no masses no hepatosplenomegaly noted.  Neuro: No focal neurological deficits noted  Musculoskeletal: No warm swelling or erythema around joints, no spinal tenderness noted. Psychiatric: Patient alert and oriented x3, poor insight into disease process.    Recent Labs  01/01/14 0645  NA 141  K 4.1  CL 107  CO2 21  GLUCOSE 103*  BUN 6  CREATININE 0.49*  CALCIUM 8.5    Recent Labs  01/01/14 0645   AST 50*  ALT 26  ALKPHOS 89  BILITOT 3.1*  PROT 6.6  ALBUMIN 3.4*   No results for input(s): LIPASE, AMYLASE in the last 72 hours.  Recent Labs  01/02/14 0550 01/03/14 0550  WBC 14.8* 12.1*  NEUTROABS 9.0* 7.2  HGB 6.4* 6.6*  HCT 18.1* 18.7*  MCV 100.6* 102.2*  PLT 307 287   Total time spent including face to face and decision making was greater than 30 minutes  Signed: Daren Yeagle A. 01/03/2014, 10:20 AM

## 2014-01-03 NOTE — Progress Notes (Signed)
Discharge instructions given.  Port deacessed.  Patient left via walking. Rubye OaksPerkia, Tiny Chaudhary Jean

## 2014-01-28 ENCOUNTER — Inpatient Hospital Stay (HOSPITAL_COMMUNITY)
Admission: EM | Admit: 2014-01-28 | Discharge: 2014-02-03 | DRG: 812 | Disposition: A | Discharge status: Home / Self Care | Payer: Medicaid Other | Attending: Internal Medicine | Admitting: Internal Medicine

## 2014-01-28 ENCOUNTER — Encounter (HOSPITAL_COMMUNITY): Payer: Self-pay

## 2014-01-28 DIAGNOSIS — Z9851 Tubal ligation status: Secondary | ICD-10-CM

## 2014-01-28 DIAGNOSIS — Z9114 Patient's other noncompliance with medication regimen: Secondary | ICD-10-CM | POA: Diagnosis present

## 2014-01-28 DIAGNOSIS — R079 Chest pain, unspecified: Secondary | ICD-10-CM | POA: Diagnosis present

## 2014-01-28 DIAGNOSIS — Z9049 Acquired absence of other specified parts of digestive tract: Secondary | ICD-10-CM | POA: Diagnosis present

## 2014-01-28 DIAGNOSIS — D72829 Elevated white blood cell count, unspecified: Secondary | ICD-10-CM | POA: Diagnosis present

## 2014-01-28 DIAGNOSIS — Z23 Encounter for immunization: Secondary | ICD-10-CM

## 2014-01-28 DIAGNOSIS — I517 Cardiomegaly: Secondary | ICD-10-CM | POA: Diagnosis present

## 2014-01-28 DIAGNOSIS — F17210 Nicotine dependence, cigarettes, uncomplicated: Secondary | ICD-10-CM | POA: Diagnosis present

## 2014-01-28 DIAGNOSIS — G8929 Other chronic pain: Secondary | ICD-10-CM | POA: Diagnosis present

## 2014-01-28 DIAGNOSIS — D5700 Hb-SS disease with crisis, unspecified: Principal | ICD-10-CM | POA: Diagnosis present

## 2014-01-28 DIAGNOSIS — F1190 Opioid use, unspecified, uncomplicated: Secondary | ICD-10-CM | POA: Diagnosis present

## 2014-01-28 DIAGNOSIS — M79606 Pain in leg, unspecified: Secondary | ICD-10-CM | POA: Diagnosis present

## 2014-01-28 DIAGNOSIS — I959 Hypotension, unspecified: Secondary | ICD-10-CM | POA: Diagnosis present

## 2014-01-28 DIAGNOSIS — E876 Hypokalemia: Secondary | ICD-10-CM | POA: Diagnosis not present

## 2014-01-28 NOTE — ED Notes (Signed)
Pt presents with c/o chest pain, back pain, and bilateral leg pain. Pt reports the chest pain started earlier today and feels like pressure on her chest. Pt reports that the pain in her back and legs is what she normally feels when she is having sickle cell pain.

## 2014-01-29 ENCOUNTER — Emergency Department (HOSPITAL_COMMUNITY): Payer: Medicaid Other

## 2014-01-29 DIAGNOSIS — Z9851 Tubal ligation status: Secondary | ICD-10-CM | POA: Diagnosis not present

## 2014-01-29 DIAGNOSIS — G8929 Other chronic pain: Secondary | ICD-10-CM | POA: Diagnosis present

## 2014-01-29 DIAGNOSIS — E876 Hypokalemia: Secondary | ICD-10-CM | POA: Diagnosis not present

## 2014-01-29 DIAGNOSIS — D5700 Hb-SS disease with crisis, unspecified: Principal | ICD-10-CM

## 2014-01-29 DIAGNOSIS — Z23 Encounter for immunization: Secondary | ICD-10-CM | POA: Diagnosis not present

## 2014-01-29 DIAGNOSIS — F17210 Nicotine dependence, cigarettes, uncomplicated: Secondary | ICD-10-CM | POA: Diagnosis present

## 2014-01-29 DIAGNOSIS — Z9114 Patient's other noncompliance with medication regimen: Secondary | ICD-10-CM | POA: Diagnosis present

## 2014-01-29 DIAGNOSIS — Z9049 Acquired absence of other specified parts of digestive tract: Secondary | ICD-10-CM | POA: Diagnosis present

## 2014-01-29 DIAGNOSIS — D72829 Elevated white blood cell count, unspecified: Secondary | ICD-10-CM | POA: Diagnosis present

## 2014-01-29 DIAGNOSIS — I959 Hypotension, unspecified: Secondary | ICD-10-CM | POA: Diagnosis present

## 2014-01-29 DIAGNOSIS — R079 Chest pain, unspecified: Secondary | ICD-10-CM | POA: Diagnosis present

## 2014-01-29 DIAGNOSIS — F1190 Opioid use, unspecified, uncomplicated: Secondary | ICD-10-CM | POA: Diagnosis present

## 2014-01-29 DIAGNOSIS — I517 Cardiomegaly: Secondary | ICD-10-CM | POA: Diagnosis present

## 2014-01-29 DIAGNOSIS — R0789 Other chest pain: Secondary | ICD-10-CM

## 2014-01-29 DIAGNOSIS — M79606 Pain in leg, unspecified: Secondary | ICD-10-CM | POA: Diagnosis present

## 2014-01-29 LAB — CBC
HEMATOCRIT: 24.5 % — AB (ref 36.0–46.0)
HEMOGLOBIN: 8.7 g/dL — AB (ref 12.0–15.0)
MCH: 33.6 pg (ref 26.0–34.0)
MCHC: 35.5 g/dL (ref 30.0–36.0)
MCV: 94.6 fL (ref 78.0–100.0)
PLATELETS: 351 10*3/uL (ref 150–400)
RBC: 2.59 MIL/uL — ABNORMAL LOW (ref 3.87–5.11)
RDW: 19.3 % — AB (ref 11.5–15.5)
WBC: 16.6 10*3/uL — ABNORMAL HIGH (ref 4.0–10.5)

## 2014-01-29 LAB — BASIC METABOLIC PANEL
ANION GAP: 12 (ref 5–15)
Anion gap: 9 (ref 5–15)
BUN: 8 mg/dL (ref 6–23)
BUN: 6 mg/dL (ref 6–23)
CHLORIDE: 105 meq/L (ref 96–112)
CO2: 23 meq/L (ref 19–32)
CO2: 22 meq/L (ref 19–32)
CREATININE: 0.49 mg/dL — AB (ref 0.50–1.10)
Calcium: 8.9 mg/dL (ref 8.4–10.5)
Calcium: 8.2 mg/dL — ABNORMAL LOW (ref 8.4–10.5)
Chloride: 104 mEq/L (ref 96–112)
Creatinine, Ser: 0.42 mg/dL — ABNORMAL LOW (ref 0.50–1.10)
GFR calc Af Amer: >90 mL/min (ref >90)
GFR calc non Af Amer: >90 mL/min (ref >90)
GFR calc non Af Amer: >90 mL/min (ref >90)
Glucose, Bld: 113 mg/dL — ABNORMAL HIGH (ref 70–99)
Glucose, Bld: 103 mg/dL — ABNORMAL HIGH (ref 70–99)
Potassium: 3.6 mEq/L — ABNORMAL LOW (ref 3.7–5.3)
Potassium: 3.4 mEq/L — ABNORMAL LOW (ref 3.7–5.3)
SODIUM: 140 meq/L (ref 137–147)
SODIUM: 135 meq/L — AB (ref 137–147)

## 2014-01-29 LAB — CBC WITH DIFFERENTIAL/PLATELET
Basophils Absolute: 0.1 10*3/uL (ref 0.0–0.1)
Basophils Relative: 1 % (ref 0–1)
EOS ABS: 0.2 10*3/uL (ref 0.0–0.7)
Eosinophils Relative: 1 % (ref 0–5)
HEMATOCRIT: 21.5 % — AB (ref 36.0–46.0)
HEMOGLOBIN: 7.6 g/dL — AB (ref 12.0–15.0)
LYMPHS ABS: 5.3 10*3/uL — AB (ref 0.7–4.0)
LYMPHS PCT: 35 % (ref 12–46)
MCH: 33.8 pg (ref 26.0–34.0)
MCHC: 35.3 g/dL (ref 30.0–36.0)
MCV: 95.6 fL (ref 78.0–100.0)
MONO ABS: 1.7 10*3/uL — AB (ref 0.1–1.0)
Monocytes Relative: 11 % (ref 3–12)
Neutro Abs: 8.2 10*3/uL — ABNORMAL HIGH (ref 1.7–7.7)
Neutrophils Relative %: 52 % (ref 43–77)
PLATELETS: 313 10*3/uL (ref 150–400)
RBC: 2.25 MIL/uL — ABNORMAL LOW (ref 3.87–5.11)
RDW: 19.3 % — ABNORMAL HIGH (ref 11.5–15.5)
WBC: 15.5 10*3/uL — AB (ref 4.0–10.5)

## 2014-01-29 LAB — HEPATIC FUNCTION PANEL
ALBUMIN: 4 g/dL (ref 3.5–5.2)
ALT: 21 U/L (ref 0–35)
AST: 43 U/L — ABNORMAL HIGH (ref 0–37)
Alkaline Phosphatase: 103 U/L (ref 39–117)
Bilirubin, Direct: 0.4 mg/dL — ABNORMAL HIGH (ref 0.0–0.3)
Indirect Bilirubin: 2.7 mg/dL — ABNORMAL HIGH (ref 0.3–0.9)
TOTAL PROTEIN: 7.3 g/dL (ref 6.0–8.3)
Total Bilirubin: 3.1 mg/dL — ABNORMAL HIGH (ref 0.3–1.2)

## 2014-01-29 LAB — MAGNESIUM: Magnesium: 1.8 mg/dL (ref 1.5–2.5)

## 2014-01-29 LAB — TROPONIN I
Troponin I: <0.3 ng/mL (ref <0.30)
Troponin I: <0.3 ng/mL (ref <0.30)

## 2014-01-29 LAB — LACTATE DEHYDROGENASE: LDH: 274 U/L — ABNORMAL HIGH (ref 94–250)

## 2014-01-29 LAB — I-STAT TROPONIN, ED: TROPONIN I, POC: 0 ng/mL (ref 0.00–0.08)

## 2014-01-29 LAB — RETICULOCYTES
RBC.: 2.62 MIL/uL — AB (ref 3.87–5.11)
Retic Count, Absolute: 440.2 10*3/uL — ABNORMAL HIGH (ref 19.0–186.0)
Retic Ct Pct: 16.8 % — ABNORMAL HIGH (ref 0.4–3.1)

## 2014-01-29 MED ORDER — PROMETHAZINE HCL 25 MG RE SUPP: 12.5000 mg | RECTAL | Status: DC | PRN

## 2014-01-29 MED ORDER — HYDROMORPHONE HCL 1 MG/ML IJ SOLN
1.0000 mg | Freq: Once | INTRAMUSCULAR | Status: AC
  Administered 2014-01-29: 1 mg via INTRAVENOUS
  Filled 2014-01-29: qty 1

## 2014-01-29 MED ORDER — TOPIRAMATE ER 100 MG PO CAP24: 100.0000 mg | ORAL_CAPSULE | Freq: Every day | ORAL | Status: DC

## 2014-01-29 MED ORDER — SODIUM CHLORIDE 0.9 % IJ SOLN
3.0000 mL | Freq: Two times a day (BID) | INTRAMUSCULAR | Status: DC
  Administered 2014-02-02: 3 mL via INTRAVENOUS

## 2014-01-29 MED ORDER — GABAPENTIN 300 MG PO CAPS
300.0000 mg | ORAL_CAPSULE | Freq: Four times a day (QID) | ORAL | Status: DC
Start: 2014-01-29 — End: 2014-02-03
  Administered 2014-01-29 – 2014-02-03 (×22): 300 mg via ORAL
  Filled 2014-01-29 (×24): qty 1

## 2014-01-29 MED ORDER — SODIUM CHLORIDE 0.9 % IJ SOLN: 9.0000 mL | INTRAMUSCULAR | Status: DC | PRN

## 2014-01-29 MED ORDER — SODIUM CHLORIDE 0.9 % IV BOLUS (SEPSIS)
1000.0000 mL | Freq: Once | INTRAVENOUS | Status: AC
  Administered 2014-01-29: 1000 mL via INTRAVENOUS

## 2014-01-29 MED ORDER — HYDROMORPHONE HCL 2 MG/ML IJ SOLN
2.0000 mg | INTRAMUSCULAR | Status: AC | PRN
  Administered 2014-01-29 (×3): 2 mg via INTRAVENOUS
  Filled 2014-01-29 (×3): qty 1

## 2014-01-29 MED ORDER — HYDROMORPHONE HCL 1 MG/ML IJ SOLN
1.0000 mg | INTRAMUSCULAR | Status: DC | PRN
  Administered 2014-01-29 (×3): 2 mg via INTRAVENOUS
  Filled 2014-01-29 (×3): qty 2

## 2014-01-29 MED ORDER — TOPIRAMATE 25 MG PO TABS
50.0000 mg | ORAL_TABLET | Freq: Two times a day (BID) | ORAL | Status: DC
  Administered 2014-01-29 – 2014-02-03 (×11): 50 mg via ORAL
  Filled 2014-01-29 (×12): qty 2

## 2014-01-29 MED ORDER — PROMETHAZINE HCL 25 MG PO TABS
12.5000 mg | ORAL_TABLET | ORAL | Status: DC | PRN
Start: 2014-01-29 — End: 2014-02-03

## 2014-01-29 MED ORDER — ENOXAPARIN SODIUM 30 MG/0.3ML ~~LOC~~ SOLN
30.0000 mg | SUBCUTANEOUS | Status: DC
Start: 2014-01-29 — End: 2014-01-30
  Administered 2014-01-29: 30 mg via SUBCUTANEOUS
  Filled 2014-01-29 (×3): qty 0.3

## 2014-01-29 MED ORDER — NALOXONE HCL 0.4 MG/ML IJ SOLN: 0.4000 mg | INTRAMUSCULAR | Status: DC | PRN

## 2014-01-29 MED ORDER — SODIUM CHLORIDE 0.9 % IV SOLN: INTRAVENOUS | Status: DC

## 2014-01-29 MED ORDER — HYDROMORPHONE 2 MG/ML HIGH CONCENTRATION IV PCA SOLN
INTRAVENOUS | Status: DC
  Administered 2014-01-29: 2 mg via INTRAVENOUS
  Administered 2014-01-30 – 2014-01-31 (×2): via INTRAVENOUS
  Administered 2014-02-01: 2 mg via INTRAVENOUS
  Administered 2014-02-01: 6 mg via INTRAVENOUS
  Administered 2014-02-01: 2 mg via INTRAVENOUS
  Administered 2014-02-01: 11 mg via INTRAVENOUS
  Administered 2014-02-01: 7.5 mg via INTRAVENOUS
  Administered 2014-02-01 – 2014-02-02 (×3): 2 mg via INTRAVENOUS
  Administered 2014-02-02: 3.5 mg via INTRAVENOUS
  Filled 2014-01-29 (×4): qty 25

## 2014-01-29 MED ORDER — FOLIC ACID 1 MG PO TABS
1.0000 mg | ORAL_TABLET | Freq: Every day | ORAL | Status: DC
  Administered 2014-01-29 – 2014-02-03 (×6): 1 mg via ORAL
  Filled 2014-01-29 (×6): qty 1

## 2014-01-29 MED ORDER — SODIUM CHLORIDE 0.9 % IV SOLN: 250.0000 mL | INTRAVENOUS | Status: DC | PRN

## 2014-01-29 MED ORDER — HYDROXYUREA 500 MG PO CAPS
500.0000 mg | ORAL_CAPSULE | Freq: Every day | ORAL | Status: DC
  Administered 2014-01-29 – 2014-01-31 (×3): 500 mg via ORAL
  Filled 2014-01-29 (×3): qty 1

## 2014-01-29 MED ORDER — SODIUM CHLORIDE 0.9 % IJ SOLN: 3.0000 mL | INTRAMUSCULAR | Status: DC | PRN

## 2014-01-29 MED ORDER — HYDROMORPHONE HCL 1 MG/ML IJ SOLN: 1.0000 mg | INTRAMUSCULAR | Status: DC | PRN

## 2014-01-29 MED ORDER — DIPHENHYDRAMINE HCL 50 MG/ML IJ SOLN
25.0000 mg | Freq: Once | INTRAMUSCULAR | Status: AC
  Administered 2014-01-29: 25 mg via INTRAVENOUS
  Filled 2014-01-29: qty 1

## 2014-01-29 MED ORDER — POLYETHYLENE GLYCOL 3350 17 G PO PACK
17.0000 g | PACK | Freq: Every day | ORAL | Status: DC | PRN
  Filled 2014-01-29: qty 1

## 2014-01-29 MED ORDER — FENTANYL CITRATE 0.05 MG/ML IJ SOLN
25.0000 ug | Freq: Once | INTRAMUSCULAR | Status: AC
  Administered 2014-01-29: 25 ug via INTRAVENOUS
  Filled 2014-01-29: qty 2

## 2014-01-29 MED ORDER — KETOROLAC TROMETHAMINE 30 MG/ML IJ SOLN
30.0000 mg | Freq: Four times a day (QID) | INTRAMUSCULAR | Status: AC
  Administered 2014-01-29 – 2014-02-03 (×20): 30 mg via INTRAVENOUS
  Filled 2014-01-29 (×25): qty 1

## 2014-01-29 MED ORDER — ONDANSETRON HCL 4 MG/2ML IJ SOLN
4.0000 mg | Freq: Four times a day (QID) | INTRAMUSCULAR | Status: DC | PRN
  Administered 2014-01-30 – 2014-02-01 (×2): 4 mg via INTRAVENOUS
  Filled 2014-01-29 (×2): qty 2

## 2014-01-29 MED ORDER — DEXTROSE-NACL 5-0.45 % IV SOLN
INTRAVENOUS | Status: DC
  Administered 2014-01-29 – 2014-01-31 (×6): via INTRAVENOUS

## 2014-01-29 MED ORDER — DIPHENHYDRAMINE HCL 50 MG/ML IJ SOLN: 25.0000 mg | Freq: Four times a day (QID) | INTRAMUSCULAR | Status: DC | PRN

## 2014-01-29 MED ORDER — ONDANSETRON HCL 4 MG/2ML IJ SOLN
4.0000 mg | Freq: Three times a day (TID) | INTRAMUSCULAR | Status: DC | PRN
Start: 2014-01-29 — End: 2014-01-29

## 2014-01-29 MED ORDER — DIPHENHYDRAMINE HCL 25 MG PO CAPS: 25.0000 mg | ORAL_CAPSULE | Freq: Four times a day (QID) | ORAL | Status: DC | PRN

## 2014-01-29 MED ORDER — SODIUM CHLORIDE 0.9 % IJ SOLN
10.0000 mL | INTRAMUSCULAR | Status: DC | PRN
  Administered 2014-01-29 – 2014-02-01 (×3): 10 mL
  Filled 2014-01-29 (×3): qty 40

## 2014-01-29 MED ORDER — SENNOSIDES-DOCUSATE SODIUM 8.6-50 MG PO TABS
1.0000 | ORAL_TABLET | Freq: Two times a day (BID) | ORAL | Status: DC
  Administered 2014-01-29 – 2014-02-03 (×8): 1 via ORAL
  Filled 2014-01-29 (×13): qty 1

## 2014-01-29 NOTE — ED Provider Notes (Signed)
CSN: [id number]     Arrival date & time 01/28/14  [id number] History   First MD Initiated Contact with Patient 01/29/14 0105     Chief Complaint  Patient presents with  . Sickle Cell Pain Crisis  . Chest Pain     (Consider location/radiation/quality/duration/timing/severity/associated sxs/prior Treatment) HPI Comments: 28 year old female, history of sickle cell disease who presents with a complaint of chest pain, shortness of breath, back pain and legs pain. This pain has been low level for a couple of days but approximately 3:00 in the afternoon her chest pain became acutely worse. She denies coughing or fever but has felt like she can't take a breath without hurting and feels short of breath. She has had a Percocet today, no other medication prior to arrival. The symptoms are persistent, worsening, severe she denies fevers, cough, dysuria, diarrhea. She does endorse having chills today..    Patient is a 28 y.o. female presenting with sickle cell pain and chest pain. The history is provided by the patient.  Sickle Cell Pain Crisis Associated symptoms: chest pain   Chest Pain   Past Medical History  Diagnosis Date  . Sickle cell anemia    Past Surgical History  Procedure Laterality Date  . Tubal ligation    . Cholecystectomy    . Cesarean section     Family History  Problem Relation Age of Onset  . Sickle cell anemia Other    History  Substance Use Topics  . Smoking status: Current Some Day Smoker  . Smokeless tobacco: Not on file  . Alcohol Use: No   OB History    No data available     Review of Systems  Cardiovascular: Positive for chest pain.  All other systems reviewed and are negative.     Allergies  Ultram and Morphine and related  Home Medications   Prior to Admission medications   Medication Sig Start Date End Date Taking? Authorizing Provider  folic acid (FOLVITE) 1 MG tablet Take 1 mg by mouth daily.   Yes Historical Provider, MD  gabapentin (NEURONTIN)  100 MG capsule Take 100 mg by mouth at bedtime.   Yes Historical Provider, MD  gabapentin (NEURONTIN) 300 MG capsule Take 300 mg by mouth 4 (four) times daily.   Yes Historical Provider, MD  hydroxyurea (HYDREA) 500 MG capsule Take 500 mg by mouth daily. May take with food to minimize GI side effects.   Yes Historical Provider, MD  Naproxen Sodium (ALEVE) 220 MG CAPS Take 660 mg by mouth every 6 (six) hours as needed (pain).   Yes Historical Provider, MD  Topiramate ER 100 MG CP24 Take 100 mg by mouth at bedtime.   Yes Historical Provider, MD   BP 95/48 mmHg  Pulse 77  Temp(Src) 98.4 F (36.9 C) (Oral)  Resp 16  SpO2 93%  LMP 01/15/2014 Physical Exam  Constitutional: She appears well-developed and well-nourished. No distress.  HENT:  Head: Normocephalic and atraumatic.  Mouth/Throat: Oropharynx is clear and moist. No oropharyngeal exudate.  Eyes: Conjunctivae and EOM are normal. Pupils are equal, round, and reactive to light. Right eye exhibits no discharge. Left eye exhibits no discharge. No scleral icterus.  Neck: Normal range of motion. Neck supple. No JVD present. No thyromegaly present.  Cardiovascular: Regular rhythm, normal heart sounds and intact distal pulses.  Exam reveals no gallop and no friction rub.   No murmur heard. Tachycardia present, strong pulses at the radial arteries  Pulmonary/Chest: Effort normal and breath sounds normal.  No respiratory distress. She has no wheezes. She has no rales.  Abdominal: Soft. Bowel sounds are normal. She exhibits no distension and no mass. There is no tenderness.  Musculoskeletal: Normal range of motion. She exhibits no edema or tenderness.  Lymphadenopathy:    She has no cervical adenopathy.  Neurological: She is alert. Coordination normal.  Skin: Skin is warm and dry. No rash noted. No erythema.  Psychiatric: She has a normal mood and affect. Her behavior is normal.  Nursing note and vitals reviewed.   ED Course  Procedures  (including critical care time) Labs Review Labs Reviewed  CBC - Abnormal; Notable for the following:    WBC 16.6 (*)    RBC 2.59 (*)    Hemoglobin 8.7 (*)    HCT 24.5 (*)    RDW 19.3 (*)    All other components within normal limits  BASIC METABOLIC PANEL - Abnormal; Notable for the following:    Potassium 3.6 (*)    Glucose, Bld 113 (*)    Creatinine, Ser 0.49 (*)    All other components within normal limits  RETICULOCYTES - Abnormal; Notable for the following:    Retic Ct Pct 16.8 (*)    RBC. 2.62 (*)    Retic Count, Manual 440.2 (*)    All other components within normal limits  HEPATIC FUNCTION PANEL - Abnormal; Notable for the following:    AST 43 (*)    Total Bilirubin 3.1 (*)    Bilirubin, Direct 0.4 (*)    Indirect Bilirubin 2.7 (*)    All other components within normal limits  I-STAT TROPOININ, ED    Imaging Review Dg Chest 2 View  01/29/2014   CLINICAL DATA:  Chest and back pain, leg pain. Likely sickle cell crisis.  EXAM: CHEST  2 VIEW  COMPARISON:  Chest radiograph March 01, 2014  FINDINGS: The cardiac silhouette is mildly enlarged, mediastinal silhouette is unremarkable. No pleural effusions or focal consolidations. No pneumothorax.  Single-lumen RIGHT chest Port-A-Cath with distal tip at cavoatrial junction. Catheter fragment again noted, which was identified within the LEFT innominate vein on prior CT. No pneumothorax. Soft tissue planes and included osseous structures are nonsuspicious. Surgical clips in the included right abdomen likely reflect cholecystectomy.  IMPRESSION: Stable mild cardiomegaly, no acute pulmonary process.   Electronically Signed   By: Awilda Metroourtnay  Bloomer   On: 01/29/2014 01:42     EKG Interpretation   Date/Time:  Sunday January 28 2014 19:24:52 EST Ventricular Rate:  114 PR Interval:  138 QRS Duration: 90 QT Interval:  333 QTC Calculation: 459 R Axis:   97 Text Interpretation:  Sinus tachycardia Borderline right axis deviation  ECG  OTHERWISE WITHIN NORMAL LIMITS Since last tracing rate faster  Confirmed by [place]ah Yeats  MD, Kenli Waldo ([id number]) on 01/29/2014 1:07:36 AM      MDM   Final diagnoses:  Chest pain  Sickle cell crisis    The patient has ongoing pain, will need pain medication, anticipate admission given the tachycardia chest pain and feeling of shortness of breath with chills as could have a possible acute chest syndrome.  Labs show leukocytosis, reticulocyte ptosis, chest x-ray without findings of acute chest syndrome. The patient has had ongoing pain despite pain medications. Slight hypotension treated with IV fluids, initial troponin normal  Discussed with Dr. Lovell SheehanJenkins of the hospitalist service who will admit.  Meds given in ED:  Medications  sodium chloride 0.9 % bolus 1,000 mL (not administered)  HYDROmorphone (DILAUDID) injection 2  mg (2 mg Intravenous Given 01/29/14 0255)  diphenhydrAMINE (BENADRYL) injection 25 mg (25 mg Intravenous Given 01/29/14 0129)  sodium chloride 0.9 % bolus 1,000 mL (1,000 mLs Intravenous New Bag/Given 01/29/14 0129)    New Prescriptions   No medications on file      Vida Roller, MD 01/29/14 [phone]

## 2014-01-29 NOTE — Progress Notes (Signed)
Pt arrived to unit room 1506 via stretcher. VS taken, pt oriented to room and callbell with no complications. Alert and oriented x4 pain 9/10. Gait unsteady, general weakness. Initial assessment completed. Will continue to monitor throughout shift

## 2014-01-29 NOTE — Progress Notes (Signed)
Patient admitted this morning with sickle cell painful crisis. She is currently on Dilaudid 1-2 mg every 2 hours. She is complaining of 10 out of 10 pain in her legs and back. She has been getting the Dilaudid as ordered but tobacco not getting relief. I will start her on Dilaudid PCA with Toradol. She is also on no IV fluids. I will start her on D5 half-normal. I'll follow her CBC as well as CMP in the morning.

## 2014-01-29 NOTE — H&P (Signed)
[facility] Admission History and Physical       Brittany Foster ZOX:[id number] DOB: 10/28/1985 DOA: 01/28/2014  Referring physician: EDP PCP: Shirlean KellyFRUNZA, ANA A, MD  Specialists:   Chief Complaint: Worsening Chest Back and Leg Pain  HPI: Brittany CampbellMiranda Foster is a 28 y.o. female with a history of Sickle Cell Disease who presents to the ED with complaints of worsening pain over the past 3 days in her chest , back and in her legs.   She denies any fever or cough or URI symptoms but reports having chills.  The chest pain is a feeling of heaviness across her chest.   She rated the pain at the worst at a 10/10 and was unrelieved by her home pain medicine regimen.  A chest X-ray was performed and was negative for acute findings and negative for findings of acute chest syndrome, but did reveal mild cardiomegaly.  An initial Troponin was 0.00, and and EKG revealed Sinus tachycardia without acute S-T changes.  She was administered  IV pain medication without relief ain the ED and was referred for admission.       Review of Systems:  Constitutional: No Weight Loss, No Weight Gain, Night Sweats, Fevers, Chills, Dizziness, Fatigue, or Generalized Weakness HEENT: No Headaches, Difficulty Swallowing,Tooth/Dental Problems,Sore Throat,  No Sneezing, Rhinitis, Ear Ache, Nasal Congestion, or Post Nasal Drip,  Cardio-vascular:  +Chest pain, Orthopnea, PND, Edema in Lower Extremities, Anasarca, Dizziness, Palpitations  Resp: No Dyspnea, No DOE, No Productive Cough, No Non-Productive Cough, No Hemoptysis, No Wheezing.    GI: No Heartburn, Indigestion, Abdominal Pain, Nausea, Vomiting, Diarrhea, Hematemesis, Hematochezia, Melena, Change in Bowel Habits,  Loss of Appetite  GU: No Dysuria, Change in Color of Urine, No Urgency or Frequency, No Flank pain.  Musculoskeletal: No Joint Pain or Swelling, No Decreased Range of Motion, +Back Pain.  Neurologic: No Syncope, No Seizures, Muscle Weakness, Paresthesia, Vision  Disturbance or Loss, No Diplopia, No Vertigo, No Difficulty Walking,  Skin: No Rash or Lesions. Psych: No Change in Mood or Affect, No Depression or Anxiety, No Memory loss, No Confusion, or Hallucinations   Past Medical History  Diagnosis Date  . Sickle cell anemia       Past Surgical History  Procedure Laterality Date  . Tubal ligation    . Cholecystectomy    . Cesarean section         Prior to Admission medications   Medication Sig Start Date End Date Taking? Authorizing Provider  folic acid (FOLVITE) 1 MG tablet Take 1 mg by mouth daily.   Yes Historical Provider, MD  gabapentin (NEURONTIN) 100 MG capsule Take 100 mg by mouth at bedtime.   Yes Historical Provider, MD  gabapentin (NEURONTIN) 300 MG capsule Take 300 mg by mouth 4 (four) times daily.   Yes Historical Provider, MD  hydroxyurea (HYDREA) 500 MG capsule Take 500 mg by mouth daily. May take with food to minimize GI side effects.   Yes Historical Provider, MD  Naproxen Sodium (ALEVE) 220 MG CAPS Take 660 mg by mouth every 6 (six) hours as needed (pain).   Yes Historical Provider, MD  Topiramate ER 100 MG CP24 Take 100 mg by mouth at bedtime.   Yes Historical Provider, MD      Allergies  Allergen Reactions  . Ultram [Tramadol] Other (See Comments)    seizures  . Morphine And Related Hives, Rash and Other (See Comments)    shaking     Social History:  reports that she has  been smoking.  She does not have any smokeless tobacco history on file. She reports that she does not drink alcohol or use illicit drugs.     Family History  Problem Relation Age of Onset  . Sickle cell anemia Other        Physical Exam:  GEN:  Pleasant Well Nourished and Well Developed thin  28 y.o.  African American female  examined  and in discomfort but no acute distress; cooperative with exam Filed Vitals:   01/29/14 0200 01/29/14 0230 01/29/14 0300 01/29/14 0330  BP: 93/49 95/48 94/53  94/51  Pulse: 78 77 87 81  Temp:        TempSrc:      Resp: 17 16 13 14   SpO2: 93% 93% 93% 95%   Blood pressure 94/51, pulse 81, temperature 98.4 F (36.9 C), temperature source Oral, resp. rate 14, last menstrual period 01/15/2014, SpO2 95 %. PSYCH: She is alert and oriented x4; does not appear anxious does not appear depressed; affect is normal HEENT: Normocephalic and Atraumatic, Mucous membranes pink; PERRLA; EOM intact; Fundi:  Benign;  No scleral icterus, Nares: Patent, Oropharynx: Clear, Fair Dentition,    Neck:  FROM, No Cervical Lymphadenopathy nor Thyromegaly or Carotid Bruit; No JVD; Breasts:: Not examined CHEST WALL: No tenderness CHEST: Normal respiration, clear to auscultation bilaterally HEART: Tachycardic,Regular Rhythm; no murmurs rubs or gallops BACK: No kyphosis or scoliosis; No CVA tenderness ABDOMEN: Positive Bowel Sounds, Soft Non-Tender; No Masses, No Organomegaly. Rectal Exam: Not done EXTREMITIES: No Cyanosis, Clubbing, or Edema; No Ulcerations. Genitalia: not examined PULSES: 2+ and symmetric SKIN: Normal hydration no rash or ulceration CNS:  Alert and oriented x 4, No Focal Deficits  Vascular: pulses palpable throughout    Labs on Admission:  Basic Metabolic Panel:  Recent Labs Lab 01/29/14 0112  NA 140  K 3.6*  CL 105  CO2 23  GLUCOSE 113*  BUN 8  CREATININE 0.49*  CALCIUM 8.9   Liver Function Tests:  Recent Labs Lab 01/29/14 0112  AST 43*  ALT 21  ALKPHOS 103  BILITOT 3.1*  PROT 7.3  ALBUMIN 4.0   No results for input(s): LIPASE, AMYLASE in the last 168 hours. No results for input(s): AMMONIA in the last 168 hours. CBC:  Recent Labs Lab 01/29/14 0112  WBC 16.6*  HGB 8.7*  HCT 24.5*  MCV 94.6  PLT 351   Cardiac Enzymes: No results for input(s): CKTOTAL, CKMB, CKMBINDEX, TROPONINI in the last 168 hours.  BNP (last 3 results) No results for input(s): PROBNP in the last 8760 hours. CBG: No results for input(s): GLUCAP in the last 168 hours.  Radiological  Exams on Admission: Dg Chest 2 View  01/29/2014   CLINICAL DATA:  Chest and back pain, leg pain. Likely sickle cell crisis.  EXAM: CHEST  2 VIEW  COMPARISON:  Chest radiograph March 01, 2014  FINDINGS: The cardiac silhouette is mildly enlarged, mediastinal silhouette is unremarkable. No pleural effusions or focal consolidations. No pneumothorax.  Single-lumen RIGHT chest Port-A-Cath with distal tip at cavoatrial junction. Catheter fragment again noted, which was identified within the LEFT innominate vein on prior CT. No pneumothorax. Soft tissue planes and included osseous structures are nonsuspicious. Surgical clips in the included right abdomen likely reflect cholecystectomy.  IMPRESSION: Stable mild cardiomegaly, no acute pulmonary process.   Electronically Signed   By: Awilda Metro   On: 01/29/2014 01:42     EKG: Independently reviewed. Sinus Tachycardia at a Rate of 114 , No  Acute S-T Changes    Assessment/Plan:   28 y.o. female with  Principal Problem:   1.   Sickle cell pain crisis   IV Dilaudid per sickle Cell Protocol     Active Problems:   2.   Chest pain   Cardiac Monitoring   Cycle Troponins   O2 PRN      3,   Leukocytosis   Probable Stress Rxn   Monitor Trend     4.   DVT Prophylaxis    Lovenox     Code Status:    FULL CODE Family Communication:   No Family Present  Disposition Plan:       Inpatient  Time spent: 7660 Minutes  Ron ParkerJENKINS,Juliette Standre C [facility] Pager [phone]   If 7AM -7PM Please Contact the Day Rounding Team MD for [facility]  If 7PM-7AM, Please Contact Night-Floor Coverage  [email or web address] Password [email or web address] 01/29/2014, 4:02 AM

## 2014-01-30 LAB — MAGNESIUM: Magnesium: 1.8 mg/dL (ref 1.5–2.5)

## 2014-01-30 LAB — CBC WITH DIFFERENTIAL/PLATELET
BASOS PCT: 1 % (ref 0–1)
Basophils Absolute: 0.1 10*3/uL (ref 0.0–0.1)
Eosinophils Absolute: 0.5 10*3/uL (ref 0.0–0.7)
Eosinophils Relative: 3 % (ref 0–5)
HCT: 20.2 % — ABNORMAL LOW (ref 36.0–46.0)
HEMOGLOBIN: 7.2 g/dL — AB (ref 12.0–15.0)
LYMPHS ABS: 5.5 10*3/uL — AB (ref 0.7–4.0)
Lymphocytes Relative: 35 % (ref 12–46)
MCH: 34.1 pg — ABNORMAL HIGH (ref 26.0–34.0)
MCHC: 35.6 g/dL (ref 30.0–36.0)
MCV: 95.7 fL (ref 78.0–100.0)
Monocytes Absolute: 1.4 10*3/uL — ABNORMAL HIGH (ref 0.1–1.0)
Monocytes Relative: 9 % (ref 3–12)
NEUTROS ABS: 8.3 10*3/uL — AB (ref 1.7–7.7)
Neutrophils Relative %: 52 % (ref 43–77)
Platelets: 330 10*3/uL (ref 150–400)
RBC: 2.11 MIL/uL — AB (ref 3.87–5.11)
RDW: 19.3 % — ABNORMAL HIGH (ref 11.5–15.5)
WBC: 15.8 10*3/uL — ABNORMAL HIGH (ref 4.0–10.5)

## 2014-01-30 LAB — COMPREHENSIVE METABOLIC PANEL
ALBUMIN: 3.1 g/dL — AB (ref 3.5–5.2)
ALK PHOS: 91 U/L (ref 39–117)
ALT: 20 U/L (ref 0–35)
AST: 34 U/L (ref 0–37)
Anion gap: 10 (ref 5–15)
BUN: 9 mg/dL (ref 6–23)
CHLORIDE: 104 meq/L (ref 96–112)
CO2: 23 mEq/L (ref 19–32)
Calcium: 8.2 mg/dL — ABNORMAL LOW (ref 8.4–10.5)
Creatinine, Ser: 0.65 mg/dL (ref 0.50–1.10)
GFR calc Af Amer: >90 mL/min (ref >90)
GFR calc non Af Amer: >90 mL/min (ref >90)
Glucose, Bld: 115 mg/dL — ABNORMAL HIGH (ref 70–99)
POTASSIUM: 3.4 meq/L — AB (ref 3.7–5.3)
Sodium: 137 mEq/L (ref 137–147)
Total Bilirubin: 3.4 mg/dL — ABNORMAL HIGH (ref 0.3–1.2)
Total Protein: 5.9 g/dL — ABNORMAL LOW (ref 6.0–8.3)

## 2014-01-30 LAB — URINALYSIS, ROUTINE W REFLEX MICROSCOPIC
Bilirubin Urine: NEGATIVE
GLUCOSE, UA: NEGATIVE mg/dL
HGB URINE DIPSTICK: NEGATIVE
Ketones, ur: NEGATIVE mg/dL
Nitrite: NEGATIVE
Protein, ur: NEGATIVE mg/dL
SPECIFIC GRAVITY, URINE: 1.008 (ref 1.005–1.030)
Urobilinogen, UA: >8 mg/dL — ABNORMAL HIGH (ref 0.0–1.0)
pH: 7.5 (ref 5.0–8.0)

## 2014-01-30 LAB — URINE MICROSCOPIC-ADD ON

## 2014-01-30 MED ORDER — ENOXAPARIN SODIUM 40 MG/0.4ML ~~LOC~~ SOLN
40.0000 mg | SUBCUTANEOUS | Status: DC
  Administered 2014-01-31 – 2014-02-02 (×3): 40 mg via SUBCUTANEOUS
  Filled 2014-01-30 (×5): qty 0.4

## 2014-01-30 MED ORDER — HYDROMORPHONE HCL 2 MG/ML IJ SOLN
2.0000 mg | INTRAMUSCULAR | Status: DC | PRN
  Administered 2014-01-30 – 2014-02-02 (×24): 2 mg via INTRAVENOUS
  Filled 2014-01-30 (×26): qty 1

## 2014-01-30 MED ORDER — POTASSIUM CHLORIDE CRYS ER 20 MEQ PO TBCR
40.0000 meq | EXTENDED_RELEASE_TABLET | ORAL | Status: AC
  Administered 2014-01-30 (×2): 40 meq via ORAL
  Filled 2014-01-30 (×2): qty 2

## 2014-01-30 MED ORDER — HYDROMORPHONE HCL 2 MG/ML IJ SOLN
2.0000 mg | INTRAMUSCULAR | Status: AC
  Administered 2014-01-30 (×4): 2 mg via INTRAVENOUS
  Filled 2014-01-30 (×2): qty 1

## 2014-01-30 NOTE — Progress Notes (Signed)
CARE MANAGEMENT NOTE 01/30/2014  Patient:  Brittany Foster,Brittany Foster   Account Number:  [id number]  Date Initiated:  01/30/2014  Documentation initiated by:  Ferdinand CavaSCHETTINO,Kirin Brandenburger  Subjective/Objective Assessment:   28 to female from home [facility]     Action/Plan:   discharge planning   Anticipated DC Date:  02/01/2014   Anticipated DC Plan:  HOME/SELF CARE      DC Planning Services  CM consult      Choice offered to / List presented to:             Status of service:  Completed, signed off Medicare Important Message given?   (If response is "NO", the following Medicare IM given date fields will be blank) Date Medicare IM given:   Medicare IM given by:   Date Additional Medicare IM given:   Additional Medicare IM given by:    Discharge Disposition:  HOME/SELF CARE  Per UR Regulation:    If discussed at Long Length of Stay Meetings, dates discussed:    Comments:  01/30/14 Ferdinand CavaAndrea Schettino RN BSN CM [phone] Patient stated that she is unable to afford her medications. She stated that she has Medicaid and confirmed that each medication costs $3 but she is still unable to afford her medications. [facility] [place] and she stated that her PCP is unable to assist with medication assist and she stated she cannot remember her PCPs name. Explained that there are no other programs available to assist with affordability of meds or care in addition to Medicaid.

## 2014-01-30 NOTE — Progress Notes (Signed)
Clinical Social Work  CSW received inappropriate referral to assist with medication assistance. CM aware of possible needs. CSW is signing off but available if further needs arise.  DundeeHolly Shaun Runyon, [place] [phone]
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
## 2014-01-30 NOTE — Progress Notes (Signed)
PCA DOCUMENTATION: Total used : 9mg  Total demand: 19 Total delivered: 18

## 2014-01-31 LAB — BASIC METABOLIC PANEL
ANION GAP: 10 (ref 5–15)
BUN: 9 mg/dL (ref 6–23)
CHLORIDE: 108 meq/L (ref 96–112)
CO2: 21 meq/L (ref 19–32)
Calcium: 8.5 mg/dL (ref 8.4–10.5)
Creatinine, Ser: 0.62 mg/dL (ref 0.50–1.10)
GFR calc non Af Amer: >90 mL/min (ref >90)
Glucose, Bld: 123 mg/dL — ABNORMAL HIGH (ref 70–99)
POTASSIUM: 3.8 meq/L (ref 3.7–5.3)
SODIUM: 139 meq/L (ref 137–147)

## 2014-01-31 LAB — LACTATE DEHYDROGENASE: LDH: 277 U/L — AB (ref 94–250)

## 2014-01-31 MED ORDER — HYDROXYUREA 500 MG PO CAPS
1000.0000 mg | ORAL_CAPSULE | Freq: Every day | ORAL | Status: DC
  Administered 2014-02-01 – 2014-02-03 (×3): 1000 mg via ORAL
  Filled 2014-01-31 (×3): qty 2

## 2014-01-31 NOTE — Progress Notes (Signed)
Chaplain stopped by briefly to let pt know that I will not be able to visit her on Friday because my schedule changed. If pt is still here next week I will try to follow up then.  Wille GlaserMcCray, Rahaf Carbonell O, Chaplain 01/31/2014 12:06 PM
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
## 2014-01-31 NOTE — Progress Notes (Signed)
   01/31/14 [id number]  Clinical Encounter Type  Visited With Patient  Visit Type Spiritual support;Social support;Psychological support  Referral From Physician  Consult/Referral To Chaplain  Spiritual Encounters  Spiritual Needs Emotional;Prayer  Stress Factors  Patient Stress Factors Exhausted;Lack of caregivers  Chaplain responded to page that pt needed support. Pt is having a very difficult time, and feels "overwhelmed." Chaplain provided empathic listening and explored emotional needs and resources. Pt is very worried about "being judged," and finds it difficult to share with people what she's going through because of this fear. Pt does not have a support system and this is very difficult for her. Chaplain affirmed the difficulty of pt's situation and also affirmed that chaplain was not judging, but that chaplain thinks pt is doing her best in a very tough situation. Pt said "no one has said that to me before." Chaplain also provided anxiety containment, doing deep breathing with pt. Chaplain provided prayer. Pt did identify some intermediate hope, and we progressed towards self-care and exploring options. Chaplain will try to follow-up with pt on Friday afternoon.   Wille GlaserMcCray, Starlett Pehrson O, Chaplain 01/31/2014  9:32 AM

## 2014-01-31 NOTE — Progress Notes (Signed)
PCA 24 hour totals:  Delivered: 9.5mg  Demands: 25 Delivered: 19

## 2014-02-01 LAB — CBC WITH DIFFERENTIAL/PLATELET
BASOS ABS: 0 10*3/uL (ref 0.0–0.1)
Basophils Relative: 0 % (ref 0–1)
EOS PCT: 6 % — AB (ref 0–5)
Eosinophils Absolute: 0.9 10*3/uL — ABNORMAL HIGH (ref 0.0–0.7)
HCT: 19.2 % — ABNORMAL LOW (ref 36.0–46.0)
HEMOGLOBIN: 6.7 g/dL — AB (ref 12.0–15.0)
LYMPHS ABS: 4.1 10*3/uL — AB (ref 0.7–4.0)
LYMPHS PCT: 26 % (ref 12–46)
MCH: 34 pg (ref 26.0–34.0)
MCHC: 34.9 g/dL (ref 30.0–36.0)
MCV: 97.5 fL (ref 78.0–100.0)
Monocytes Absolute: 1.4 10*3/uL — ABNORMAL HIGH (ref 0.1–1.0)
Monocytes Relative: 9 % (ref 3–12)
Neutro Abs: 9.2 10*3/uL — ABNORMAL HIGH (ref 1.7–7.7)
Neutrophils Relative %: 59 % (ref 43–77)
PLATELETS: 291 10*3/uL (ref 150–400)
RBC: 1.97 MIL/uL — AB (ref 3.87–5.11)
RDW: 22 % — ABNORMAL HIGH (ref 11.5–15.5)
WBC: 15.6 10*3/uL — AB (ref 4.0–10.5)
nRBC: 2 /100 WBC — ABNORMAL HIGH

## 2014-02-01 LAB — LACTATE DEHYDROGENASE: LDH: 312 U/L — ABNORMAL HIGH (ref 94–250)

## 2014-02-01 NOTE — Progress Notes (Signed)
Subjective: Patient admitted with sickle cell painful crisis on Dilaudid PCA, Toradol and IVF. Pain still at 7/10, has used 12 mg of Dilaudid with 23 demands and 21 deliveries in the last 24 hours. No SOB, no cough. She still has  Not been back to her baseline. No fever. Has ben told she will follow up with Dr Ashley RoyaltyMatthews at [place].  Objective: Vital signs in last 24 hours: Temp:  [97.8 F (36.6 C)-99.1 F (37.3 C)] 98.5 F (36.9 C) (12/03 0949) Pulse Rate:  [80-94] 80 (12/03 0949) Resp:  [13-18] 14 (12/03 0949) BP: (96-100)/(47-53) 100/53 mmHg (12/03 0949) SpO2:  [91 %-99 %] 95 % (12/03 0949) FiO2 (%):  [35 %] 35 % (12/02 1711) Weight change:  Last BM Date: 01/31/14  Intake/Output from previous day: 12/02 0701 - 12/03 0700 In: 440 [P.O.:440] Out: 2 [Urine:2] Intake/Output this shift: Total I/O In: 120 [P.O.:120] Out: -   General appearance: alert, cooperative and no distress Throat: lips, mucosa, and tongue normal; teeth and gums normal Neck: no adenopathy, no carotid bruit, no JVD, supple, symmetrical, trachea midline and thyroid not enlarged, symmetric, no tenderness/mass/nodules Back: symmetric, no curvature. ROM normal. No CVA tenderness. Resp: clear to auscultation bilaterally Chest wall: no tenderness Cardio: regular rate and rhythm, S1, S2 normal, no murmur, click, rub or gallop GI: soft, non-tender; bowel sounds normal; no masses,  no organomegaly Extremities: extremities normal, atraumatic, no cyanosis or edema Pulses: 2+ and symmetric Skin: Skin color, texture, turgor normal. No rashes or lesions Neurologic: Grossly normal  Lab Results:  Recent Labs  01/30/14 0439 02/01/14 0450  WBC 15.8* 15.6*  HGB 7.2* 6.7*  HCT 20.2* 19.2*  PLT 330 291   BMET  Recent Labs  01/30/14 0439 01/31/14 0632  NA 137 139  K 3.4* 3.8  CL 104 108  CO2 23 21  GLUCOSE 115* 123*  BUN 9 9  CREATININE 0.65 0.62  CALCIUM 8.2* 8.5    Studies/Results: No results  found.  Medications: I have reviewed the patient's current medications.  Assessment/Plan: A 28 yo woman with known sickle cell disease admitted with sickle cell Painful crisis.  #1 Sickle Cell Painful crisis: Patient will be gradually weaned off the PCA while starting her oral medications. The goal will be to transition patient to her home regimen and Dc next 24-48 hours.  #2 Sickle Cell anemia: H/H stable at baseline. Continue to monitor  #3 Hypokalemia: Repleted  #4 Leucocytosis: due to VOC. Continue to monitor  #5  Gait abnormality: patient to try walking again today. If no improvement will have formal PT evaluation.  LOS: 4 days   Kymiah Araiza,LAWAL 02/01/2014, 11:07 AM

## 2014-02-02 MED ORDER — OXYCODONE HCL 5 MG PO TABS
10.0000 mg | ORAL_TABLET | ORAL | Status: DC
  Administered 2014-02-02: 10 mg via ORAL

## 2014-02-02 MED ORDER — HYDROMORPHONE 2 MG/ML HIGH CONCENTRATION IV PCA SOLN: INTRAVENOUS | Status: DC

## 2014-02-02 MED ORDER — OXYCODONE HCL 5 MG PO TABS
ORAL_TABLET | ORAL | Status: AC
  Filled 2014-02-02: qty 2

## 2014-02-02 NOTE — Progress Notes (Signed)
24 hr PCA  10 mg 21 demands  20 deliveries

## 2014-02-02 NOTE — Progress Notes (Signed)
Patient ID: Brittany Foster, female   DOB: 05/26/1985, 28 y.o.   MRN: [id number] SICKLE CELL SERVICE PROGRESS NOTE  Brittany CampbellMiranda Foster [id number] DOB: 05/31/1985 DOA: 01/28/2014 PCP: Shirlean KellyFRUNZA, ANA A, MD   Presenting HPI: Brittany Foster is a 28 y.o. female with a history of Sickle Cell Disease who presents to the ED with complaints of worsening pain over the past 3 days in her chest , back and in her legs. She denies any fever or cough or URI symptoms but reports having chills. The chest pain is a feeling of heaviness across her chest. She rated the pain at the worst at a 10/10 and was unrelieved by her home pain medicine regimen. A chest X-ray was performed and was negative for acute findings and negative for findings of acute chest syndrome, but did reveal mild cardiomegaly. An initial Troponin was 0.00, and and EKG revealed Sinus tachycardia without acute S-T changes. She was administered IV pain medication without relief ain the ED and was referred for admission.   Consultants:  None  Procedures:  none  Antibiotics:  none  HPI/Subjective: Pt states that she was doing better this morning and her pain went down to a 7/10. She then was walking around and she now complains of a 9/10 chest pain. She denies any cough, sputum production, or fever.   Objective: Filed Vitals:   02/02/14 0624 02/02/14 0948 02/02/14 1106 02/02/14 1403  BP: 101/58   90/51  Pulse: 108   78  Temp: 98.6 F (37 C)   98.8 F (37.1 C)  TempSrc: Oral   Oral  Resp: 14 13 11 12   Height:      Weight:      SpO2: 95% 95% 93% 97%   Weight change:   Intake/Output Summary (Last 24 hours) at 02/02/14 1558 Last data filed at 02/01/14 1900  Gross per 24 hour  Intake    240 ml  Output      0 ml  Net    240 ml    General: Alert, awake, oriented x3, in no acute distress.  HEENT: /AT PEERL, EOMI Neck: Trachea midline,  no masses, no thyromegal,y no JVD, no carotid bruit OROPHARYNX:  Moist, No exudate/  erythema/lesions.  Heart: Regular rate and rhythm, without murmurs, rubs, gallops Lungs: Clear to auscultation, no wheezing or rhonchi noted. No increased vocal fremitus resonant to percussion  Abdomen: Soft, nontender, nondistended, positive bowel sounds, no masses no hepatosplenomegaly noted..  Neuro: No focal neurological deficits noted cranial nerves II through XII grossly intact. Strength 5 out of 5 in bilateral upper and lower extremities. Musculoskeletal: No warm swelling or erythema around joints, no spinal tenderness noted.    Data Reviewed: Basic Metabolic Panel:  Recent Labs Lab 01/29/14 0112 01/29/14 0635 01/30/14 0439 01/30/14 0449 01/31/14 0632  NA 140 135* 137  --  139  K 3.6* 3.4* 3.4*  --  3.8  CL 105 104 104  --  108  CO2 23 22 23   --  21  GLUCOSE 113* 103* 115*  --  123*  BUN 8 6 9   --  9  CREATININE 0.49* 0.42* 0.65  --  0.62  CALCIUM 8.9 8.2* 8.2*  --  8.5  MG  --  1.8  --  1.8  --    Liver Function Tests:  Recent Labs Lab 01/29/14 0112 01/30/14 0439  AST 43* 34  ALT 21 20  ALKPHOS 103 91  BILITOT 3.1* 3.4*  PROT 7.3 5.9*  ALBUMIN 4.0  3.1*   CBC:  Recent Labs Lab 01/29/14 0112 01/29/14 0635 01/30/14 0439 02/01/14 0450  WBC 16.6* 15.5* 15.8* 15.6*  NEUTROABS  --  8.2* 8.3* 9.2*  HGB 8.7* 7.6* 7.2* 6.7*  HCT 24.5* 21.5* 20.2* 19.2*  MCV 94.6 95.6 95.7 97.5  PLT 351 313 330 291   Cardiac Enzymes:  Recent Labs Lab 01/29/14 0635 01/29/14 1228 01/29/14 1900  TROPONINI <0.30 <0.30 <0.30   BNP (last 3 results) No results for input(s): PROBNP in the last 8760 hours. CBG: No results for input(s): GLUCAP in the last 168 hours.  No results found for this or any previous visit (from the past 240 hour(s)).   Studies: Dg Chest 2 View  01/29/2014   CLINICAL DATA:  Chest and back pain, leg pain. Likely sickle cell crisis.  EXAM: CHEST  2 VIEW  COMPARISON:  Chest radiograph March 01, 2014  FINDINGS: The cardiac silhouette is mildly  enlarged, mediastinal silhouette is unremarkable. No pleural effusions or focal consolidations. No pneumothorax.  Single-lumen RIGHT chest Port-A-Cath with distal tip at cavoatrial junction. Catheter fragment again noted, which was identified within the LEFT innominate vein on prior CT. No pneumothorax. Soft tissue planes and included osseous structures are nonsuspicious. Surgical clips in the included right abdomen likely reflect cholecystectomy.  IMPRESSION: Stable mild cardiomegaly, no acute pulmonary process.   Electronically Signed   By: Awilda Metroourtnay  Bloomer   On: 01/29/2014 01:42    Scheduled Meds: . enoxaparin (LOVENOX) injection  40 mg Subcutaneous Q24H  . folic acid  1 mg Oral Daily  . gabapentin  300 mg Oral QID  . HYDROmorphone PCA 2 mg/mL   Intravenous 6 times per day  . hydroxyurea  1,000 mg Oral Daily  . ketorolac  30 mg Intravenous 4 times per day  . oxyCODONE  10 mg Oral Q4H  . senna-docusate  1 tablet Oral BID  . sodium chloride  3 mL Intravenous Q12H  . topiramate  50 mg Oral BID   Continuous Infusions:   Principal Problem:   Sickle cell pain crisis Active Problems:   Chest pain   Leukocytosis   Assessment/Plan: Principal Problem:   Sickle cell pain crisis Active Problems:   Chest pain   Leukocytosis 1)Sickle Cell Crisis: Patient presented with pain characteristic of acute vaso-occlusive crisis. Continue Dilaudid PCAbut will decrease to 0.3mg  q10 min. Start Oxycodone 10mg . Continue Ketorolac. Stop clinician assisted doses of Dilaudid. Continue gabapentin for chronic pain.  2)Sickle Cell care: Continue Hydrea and Folic acid.  3)Migraines: Contiue topiramate for prophylaxis   FEN/GI : Regular Diet  No IV fluids Bowel regimen in place  Code Status: full DVT Prophylaxis: enoxaparin Family Communication: none Disposition Plan: pending improvement and ability to manage pain on PO opiods Serina CowperGBOOLA, Virdia Ziesmer  Pager [phone]. If 7PM-7AM, please contact  night-coverage.  02/02/2014, 3:58 PM  LOS: 5 days   Serina CowperAGBOOLA, Mohammed Mcandrew

## 2014-02-02 NOTE — Progress Notes (Signed)
Patient ambulated about 200 ft in hall. She became tearful while ambulating stating she was in pain. Patient medicated prior to ambulating and encouraged use of PCA.

## 2014-02-03 LAB — CBC WITH DIFFERENTIAL/PLATELET
Basophils Absolute: 0.3 10*3/uL — ABNORMAL HIGH (ref 0.0–0.1)
Basophils Relative: 2 % — ABNORMAL HIGH (ref 0–1)
EOS PCT: 4 % (ref 0–5)
Eosinophils Absolute: 0.6 10*3/uL (ref 0.0–0.7)
HCT: 19.3 % — ABNORMAL LOW (ref 36.0–46.0)
HEMOGLOBIN: 6.8 g/dL — AB (ref 12.0–15.0)
LYMPHS ABS: 3.5 10*3/uL (ref 0.7–4.0)
Lymphocytes Relative: 25 % (ref 12–46)
MCH: 34.7 pg — AB (ref 26.0–34.0)
MCHC: 35.2 g/dL (ref 30.0–36.0)
MCV: 98.5 fL (ref 78.0–100.0)
MONO ABS: 0.4 10*3/uL (ref 0.1–1.0)
Monocytes Relative: 3 % (ref 3–12)
NRBC: 4 /100{WBCs} — AB
Neutro Abs: 9.3 10*3/uL — ABNORMAL HIGH (ref 1.7–7.7)
Neutrophils Relative %: 66 % (ref 43–77)
Platelets: 277 10*3/uL (ref 150–400)
RBC: 1.96 MIL/uL — ABNORMAL LOW (ref 3.87–5.11)
RDW: 23.8 % — ABNORMAL HIGH (ref 11.5–15.5)
WBC: 14.1 10*3/uL — ABNORMAL HIGH (ref 4.0–10.5)

## 2014-02-03 LAB — BASIC METABOLIC PANEL
Anion gap: 12 (ref 5–15)
BUN: 8 mg/dL (ref 6–23)
CHLORIDE: 108 meq/L (ref 96–112)
CO2: 20 mEq/L (ref 19–32)
CREATININE: 0.54 mg/dL (ref 0.50–1.10)
Calcium: 8.8 mg/dL (ref 8.4–10.5)
GFR calc non Af Amer: >90 mL/min (ref >90)
GLUCOSE: 94 mg/dL (ref 70–99)
POTASSIUM: 3.8 meq/L (ref 3.7–5.3)
Sodium: 140 mEq/L (ref 137–147)

## 2014-02-03 LAB — RETICULOCYTES: RBC.: 1.96 MIL/uL — AB (ref 3.87–5.11)

## 2014-02-03 LAB — LACTATE DEHYDROGENASE: LDH: 305 U/L — ABNORMAL HIGH (ref 94–250)

## 2014-02-03 MED ORDER — OXYCODONE-ACETAMINOPHEN 5-325 MG PO TABS: 2.0000 | ORAL_TABLET | ORAL | Status: DC | PRN

## 2014-02-03 MED ORDER — HEPARIN SOD (PORK) LOCK FLUSH 100 UNIT/ML IV SOLN
500.0000 [IU] | INTRAVENOUS | Status: AC | PRN
  Administered 2014-02-03: 500 [IU]

## 2014-02-03 MED ORDER — OXYCODONE-ACETAMINOPHEN 5-325 MG PO TABS
2.0000 | ORAL_TABLET | Freq: Once | ORAL | Status: AC
  Administered 2014-02-03: 2 via ORAL
  Filled 2014-02-03: qty 2

## 2014-02-03 MED ORDER — INFLUENZA VAC SPLIT QUAD 0.5 ML IM SUSY
0.5000 mL | PREFILLED_SYRINGE | INTRAMUSCULAR | Status: AC
  Administered 2014-02-03: 0.5 mL via INTRAMUSCULAR
  Filled 2014-02-03: qty 0.5

## 2014-02-03 MED ORDER — OXYCODONE HCL 5 MG PO TABS: 10.0000 mg | ORAL_TABLET | ORAL | Status: DC | PRN

## 2014-02-03 NOTE — Discharge Summary (Signed)
Physician Discharge Summary  Brittany CampbellMiranda Michalski ZOX:[id number] DOB: 01/18/1986 DOA: 01/28/2014  PCP: Shirlean KellyFRUNZA, ANA A, MD  Admit date: 01/28/2014 Discharge date: 02/03/2014  Discharge Diagnoses:  Principal Problem:   Sickle cell pain crisis Active Problems:   Chest pain   Leukocytosis   Discharge Condition: stable  Disposition: home Follow-up Information    Follow up with [name],[name], MD.   Specialty:  Internal Medicine   Why:  at 1:45pm   Contact information:   [address] [place] [address] [place] [address] [phone]       Diet:regular  Wt Readings from Last 3 Encounters:  01/29/14 119 lb 11.4 oz (54.3 kg)  01/02/14 121 lb 14.4 oz (55.293 kg)    History of present illness:  Brittany Foster is a 28 y.o. female with a history of Sickle Cell Disease who presents to the ED with complaints of worsening pain over the past 3 days in her chest , back and in her legs. She denies any fever or cough or URI symptoms but reports having chills. The chest pain is a feeling of heaviness across her chest. She rated the pain at the worst at a 10/10 and was unrelieved by her home pain medicine regimen. A chest X-ray was performed and was negative for acute findings and negative for findings of acute chest syndrome, but did reveal mild cardiomegaly. An initial Troponin was 0.00, and and EKG revealed Sinus tachycardia without acute S-T changes. She was administered IV pain medication without relief ain the ED and was referred for admission.   Hospital Course:  Sickle Cell Crisis: Patient presented with pain characteristic of acute vaso-occlusive crisis.Pt's pain was treated with bolus IV analgesics initially and was later transitioned with a Dilaudid PCA and ketorolac. PCA was decreased as pain improved and she was started on Oxycodone 10mg  (Pt previously on Percocet 325 mg/5mg , 2 tabs q4h PRN). PCA was discontinued, and her pain was well controlled on oral oxycodone even when she refused  to take med. She had overall improvement of her pain and was physically functional upon discharge. She was walking without difficulty and going to and from the bathroom without any assistance. [facility] would like to switch her care here. Pt given follow-up appointment [facility]. In addition, she was given a prescription for Percocet for pain management at home until she can see Dr. Ashley RoyaltyMatthews.  Discharge Exam:  Filed Vitals:   02/03/14 1011  BP: 100/58  Pulse: 77  Temp: 97.3 F (36.3 C)  Resp: 14   Filed Vitals:   02/03/14 0400 02/03/14 0439 02/03/14 1010 02/03/14 1011  BP:  87/50 90/71 100/58  Pulse:  71  77  Temp:  97.9 F (36.6 C)  97.3 F (36.3 C)  TempSrc:  Oral  Oral  Resp: 16 16  14   Height:      Weight:      SpO2: 98% 96%  100%    General: Alert, awake, oriented x3, in no acute distress.  HEENT: [place]/AT PEERL, EOMI Neck: Trachea midline,  no masses, no thyromegal,y no JVD, no carotid bruit OROPHARYNX:  Moist, No exudate/ erythema/lesions.  Heart: Regular rate and rhythm, without murmurs, rubs, gallops Lungs: Clear to auscultation, no wheezing or rhonchi noted. No increased vocal fremitus resonant to percussion  Abdomen: Soft, nontender, nondistended, positive bowel sounds, no masses no hepatosplenomegaly noted. Neuro: No focal neurological deficits noted cranial nerves II through XII grossly intact. DTRs 2+  bilaterally upper and lower extremities. Strength 5 out of 5 in bilateral upper and lower extremities. Musculoskeletal: No warm swelling or erythema around joints, no spinal tenderness noted. Psychiatric: Patient alert and oriented x3, appropriate mood and affect, good insight and cognition.  Discharge Instructions as above   Medication List    TAKE these medications        ALEVE 220 MG Caps  Generic drug:  Naproxen Sodium  Take 660 mg by mouth every 6 (six) hours as needed (pain).     folic acid 1 MG  tablet  Commonly known as:  FOLVITE  Take 1 mg by mouth daily.     gabapentin 300 MG capsule  Commonly known as:  NEURONTIN  Take 300 mg by mouth 4 (four) times daily.     gabapentin 100 MG capsule  Commonly known as:  NEURONTIN  Take 100 mg by mouth at bedtime.     hydroxyurea 500 MG capsule  Commonly known as:  HYDREA  Take 500 mg by mouth daily. May take with food to minimize GI side effects.     oxyCODONE-acetaminophen 5-325 MG per tablet  Commonly known as:  ROXICET  Take 2 tablets by mouth every 4 (four) hours as needed for severe pain.     Topiramate ER 100 MG Cp24  Take 100 mg by mouth at bedtime.          The results of significant diagnostics from this hospitalization (including imaging, microbiology, ancillary and laboratory) are listed below for reference.    Significant Diagnostic Studies: Dg Chest 2 View  01/29/2014   CLINICAL DATA:  Chest and back pain, leg pain. Likely sickle cell crisis.  EXAM: CHEST  2 VIEW  COMPARISON:  Chest radiograph March 01, 2014  FINDINGS: The cardiac silhouette is mildly enlarged, mediastinal silhouette is unremarkable. No pleural effusions or focal consolidations. No pneumothorax.  Single-lumen RIGHT chest Port-A-Cath with distal tip at cavoatrial junction. Catheter fragment again noted, which was identified within the LEFT innominate vein on prior CT. No pneumothorax. Soft tissue planes and included osseous structures are nonsuspicious. Surgical clips in the included right abdomen likely reflect cholecystectomy.  IMPRESSION: Stable mild cardiomegaly, no acute pulmonary process.   Electronically Signed   By: Awilda Metroourtnay  Bloomer   On: 01/29/2014 01:42    Microbiology: No results found for this or any previous visit (from the past 240 hour(s)).   Labs: Basic Metabolic Panel:  Recent Labs Lab 01/29/14 0112 01/29/14 [id number] 01/30/14 0439 01/30/14 0449 01/31/14 0632 02/03/14 0505  NA 140 135* 137  --  139 140  K 3.6* 3.4* 3.4*   --  3.8 3.8  CL 105 104 104  --  108 108  CO2 23 22 23   --  21 20  GLUCOSE 113* 103* 115*  --  123* 94  BUN 8 6 9   --  9 8  CREATININE 0.49* 0.42* 0.65  --  0.62 0.54  CALCIUM 8.9 8.2* 8.2*  --  8.5 8.8  MG  --  1.8  --  1.8  --   --    Liver Function Tests:  Recent Labs Lab 01/29/14 0112 01/30/14 0439  AST 43* 34  ALT 21 20  ALKPHOS 103 91  BILITOT 3.1* 3.4*  PROT 7.3 5.9*  ALBUMIN 4.0 3.1*   No results for input(s): LIPASE, AMYLASE in the last 168 hours. No results for input(s): AMMONIA in the last 168 hours. CBC:  Recent Labs Lab 01/29/14 0112 01/29/14 0635 01/30/14  1610 02/01/14 0450 02/03/14 0505  WBC 16.6* 15.5* 15.8* 15.6* 14.1*  NEUTROABS  --  8.2* 8.3* 9.2* 9.3*  HGB 8.7* 7.6* 7.2* 6.7* 6.8*  HCT 24.5* 21.5* 20.2* 19.2* 19.3*  MCV 94.6 95.6 95.7 97.5 98.5  PLT 351 313 330 291 277   Cardiac Enzymes:  Recent Labs Lab 01/29/14 0635 01/29/14 1228 01/29/14 1900  TROPONINI <0.30 <0.30 <0.30    Time coordinating discharge: 30 min.  SignedSerina Cowper  02/03/2014, 1:46 PM

## 2014-02-03 NOTE — Progress Notes (Signed)
Patient refusing oral pain medication. Educated the patient on the medication and pain control. Patient verbalized understanding and still refusing oral pain medication. Dr Nathanial MillmanAgboola made aware.

## 2014-02-03 NOTE — Progress Notes (Signed)
High Concentrate Dilaudid 18 ml wasted per Earlean PolkaNicole Thomas Mabry RN and Eddie Candleavid Tuchman RN. Wasted in sink.

## 2014-02-08 ENCOUNTER — Encounter (HOSPITAL_COMMUNITY): Payer: Self-pay | Admitting: *Deleted

## 2014-02-08 ENCOUNTER — Emergency Department (HOSPITAL_COMMUNITY): Payer: Medicaid Other

## 2014-02-08 ENCOUNTER — Emergency Department (HOSPITAL_COMMUNITY)
Admission: EM | Admit: 2014-02-08 | Discharge: 2014-02-08 | Disposition: A | Discharge status: Home / Self Care | Payer: Medicaid Other | Attending: Emergency Medicine | Admitting: Emergency Medicine

## 2014-02-08 DIAGNOSIS — D5700 Hb-SS disease with crisis, unspecified: Secondary | ICD-10-CM | POA: Diagnosis not present

## 2014-02-08 DIAGNOSIS — Z720 Tobacco use: Secondary | ICD-10-CM | POA: Insufficient documentation

## 2014-02-08 DIAGNOSIS — Z79899 Other long term (current) drug therapy: Secondary | ICD-10-CM | POA: Insufficient documentation

## 2014-02-08 DIAGNOSIS — R011 Cardiac murmur, unspecified: Secondary | ICD-10-CM | POA: Insufficient documentation

## 2014-02-08 DIAGNOSIS — R079 Chest pain, unspecified: Secondary | ICD-10-CM | POA: Diagnosis present

## 2014-02-08 LAB — CBC WITH DIFFERENTIAL/PLATELET
Basophils Absolute: 0 10*3/uL (ref 0.0–0.1)
Basophils Relative: 0 % (ref 0–1)
EOS PCT: 0 % (ref 0–5)
Eosinophils Absolute: 0 10*3/uL (ref 0.0–0.7)
HEMATOCRIT: 23.5 % — AB (ref 36.0–46.0)
Hemoglobin: 8.2 g/dL — ABNORMAL LOW (ref 12.0–15.0)
LYMPHS ABS: 2.7 10*3/uL (ref 0.7–4.0)
Lymphocytes Relative: 23 % (ref 12–46)
MCH: 35.5 pg — ABNORMAL HIGH (ref 26.0–34.0)
MCHC: 34.9 g/dL (ref 30.0–36.0)
MCV: 101.7 fL — AB (ref 78.0–100.0)
MONOS PCT: 10 % (ref 3–12)
Monocytes Absolute: 1.2 10*3/uL — ABNORMAL HIGH (ref 0.1–1.0)
NEUTROS ABS: 7.8 10*3/uL — AB (ref 1.7–7.7)
Neutrophils Relative %: 67 % (ref 43–77)
Platelets: 384 10*3/uL (ref 150–400)
RBC: 2.31 MIL/uL — AB (ref 3.87–5.11)
RDW: 21.6 % — ABNORMAL HIGH (ref 11.5–15.5)
WBC: 11.7 10*3/uL — AB (ref 4.0–10.5)

## 2014-02-08 LAB — BASIC METABOLIC PANEL
Anion gap: 14 (ref 5–15)
BUN: 4 mg/dL — AB (ref 6–23)
CO2: 20 mEq/L (ref 19–32)
Calcium: 9.1 mg/dL (ref 8.4–10.5)
Chloride: 108 mEq/L (ref 96–112)
Creatinine, Ser: 0.41 mg/dL — ABNORMAL LOW (ref 0.50–1.10)
GFR calc non Af Amer: >90 mL/min (ref >90)
GLUCOSE: 88 mg/dL (ref 70–99)
POTASSIUM: 3.3 meq/L — AB (ref 3.7–5.3)
Sodium: 142 mEq/L (ref 137–147)

## 2014-02-08 LAB — RETICULOCYTES
RBC.: 2.31 MIL/uL — ABNORMAL LOW (ref 3.87–5.11)
Retic Count, Absolute: 388.1 10*3/uL — ABNORMAL HIGH (ref 19.0–186.0)
Retic Ct Pct: 16.8 % — ABNORMAL HIGH (ref 0.4–3.1)

## 2014-02-08 LAB — TROPONIN I: Troponin I: <0.3 ng/mL (ref <0.30)

## 2014-02-08 MED ORDER — HYDROMORPHONE HCL 1 MG/ML IJ SOLN
1.0000 mg | Freq: Once | INTRAMUSCULAR | Status: AC
  Administered 2014-02-08: 1 mg via INTRAVENOUS
  Filled 2014-02-08: qty 1

## 2014-02-08 MED ORDER — HEPARIN SOD (PORK) LOCK FLUSH 100 UNIT/ML IV SOLN
500.0000 [IU] | Freq: Once | INTRAVENOUS | Status: AC
  Administered 2014-02-08: 500 [IU]
  Filled 2014-02-08: qty 5

## 2014-02-08 MED ORDER — KETOROLAC TROMETHAMINE 30 MG/ML IJ SOLN
30.0000 mg | Freq: Once | INTRAMUSCULAR | Status: AC
  Administered 2014-02-08: 30 mg via INTRAVENOUS
  Filled 2014-02-08: qty 1

## 2014-02-08 MED ORDER — HYDROMORPHONE HCL 2 MG/ML IJ SOLN
2.0000 mg | Freq: Once | INTRAMUSCULAR | Status: AC
  Administered 2014-02-08: 2 mg via INTRAVENOUS
  Filled 2014-02-08: qty 1

## 2014-02-08 MED ORDER — ASPIRIN 81 MG PO CHEW
324.0000 mg | CHEWABLE_TABLET | Freq: Once | ORAL | Status: AC
  Administered 2014-02-08: 324 mg via ORAL
  Filled 2014-02-08: qty 4

## 2014-02-08 NOTE — ED Notes (Signed)
02 @ 2l/min [place] applied. Sats are currently 99% on RA. Will continue to monitor.

## 2014-02-08 NOTE — ED Provider Notes (Signed)
CSN: [id number]   [id number]  Arrival date & time 02/08/14  1038 History   First MD Initiated Contact with Patient 02/08/14 1042     Chief Complaint  Patient presents with  . Chest Pain    (Consider location/radiation/quality/duration/timing/severity/associated sxs/prior Treatment) HPI Pt is a 28yo w/ history of sickle cell disease who presents with severe chest pain since yesterday morning. She reports that the pain is sharp substernal and radiates down her left arm. She reports it is similar to her past pain crises but she also has chest pressure which is not normal. She also has SOB associated with the pain but has not been coughing. She also reports that yesterday she was feeling weak and lightheaded and had an episode of syncope where she hit her right hip on a table but is not sure whether she hit her head. She also endorses headache since that fall. She took her home pain medicine but it did not relieve her chest pain. She denies fever, chills, n/v/d, URI symptoms. She reports her baseline hemoglobin is about 9-10 and she had acute chest in the past. On review of records it appears that her hemoglobin usually runs closer to 7-8 and was 6.8 earlier this month.  Past Medical History  Diagnosis Date  . Sickle cell anemia    Past Surgical History  Procedure Laterality Date  . Tubal ligation    . Cholecystectomy    . Cesarean section     Family History  Problem Relation Age of Onset  . Sickle cell anemia Other    History  Substance Use Topics  . Smoking status: Current Some Day Smoker  . Smokeless tobacco: Not on file  . Alcohol Use: No   OB History    No data available     Review of Systems See HPI   Allergies  Ultram and Morphine and related  Home Medications   Prior to Admission medications   Medication Sig Start Date End Date Taking? Authorizing Provider  folic acid (FOLVITE) 1 MG tablet Take 1 mg by mouth daily.   Yes Historical Provider, MD  gabapentin (NEURONTIN) 100  MG capsule Take 100 mg by mouth at bedtime.   Yes Historical Provider, MD  gabapentin (NEURONTIN) 300 MG capsule Take 300 mg by mouth 4 (four) times daily.   Yes Historical Provider, MD  hydroxyurea (HYDREA) 500 MG capsule Take 500 mg by mouth daily. May take with food to minimize GI side effects.   Yes Historical Provider, MD  Naproxen Sodium (ALEVE) 220 MG CAPS Take 660 mg by mouth every 6 (six) hours as needed (pain).   Yes Historical Provider, MD  oxyCODONE-acetaminophen (ROXICET) 5-325 MG per tablet Take 2 tablets by mouth every 4 (four) hours as needed for severe pain. 02/03/14  Yes Serina Cowperlabunmi Agboola, MD  Topiramate ER 100 MG CP24 Take 100 mg by mouth at bedtime.   Yes Historical Provider, MD   BP 112/67 mmHg  Pulse 86  Temp(Src) 98.9 F (37.2 C) (Oral)  Resp 19  SpO2 98%  LMP 02/04/2014 (Exact Date) Physical Exam  Constitutional: She is oriented to person, place, and time. She appears well-developed and well-nourished. No distress.  HENT:  Head: Normocephalic and atraumatic.  Eyes: Conjunctivae are normal. Right eye exhibits no discharge. Left eye exhibits no discharge.  Cardiovascular: Normal rate, regular rhythm, S1 normal, S2 normal and intact distal pulses.  Exam reveals no gallop and no friction rub.   Murmur heard.  Systolic murmur is present with  a grade of 3/6  Pulmonary/Chest: Effort normal and breath sounds normal. No respiratory distress. She has no wheezes.  Abdominal: Soft. Bowel sounds are normal. She exhibits no distension. There is no tenderness.  Musculoskeletal: She exhibits no edema or tenderness.  Neurological: She is alert and oriented to person, place, and time.  Skin: Skin is warm and dry. No rash noted. She is not diaphoretic. No erythema.  Psychiatric: She has a normal mood and affect. Her behavior is normal.  Nursing note and vitals reviewed.   ED Course  Procedures (including critical care time) Labs Review Labs Reviewed  RETICULOCYTES  CBC WITH  DIFFERENTIAL  BASIC METABOLIC PANEL    Imaging Review No results found.   EKG Interpretation   Date/Time:  Thursday February 08 2014 10:47:29 EST Ventricular Rate:  94 PR Interval:  159 QRS Duration: 92 QT Interval:  396 QTC Calculation: 495 R Axis:   70 Text Interpretation:  Sinus rhythm Consider right atrial enlargement  Prolonged QT interval Baseline wander in lead(s) V2 Similar to prior  Confirmed by [facility]  MD, BLAIR ([id number]) on 02/08/2014 10:48:59 AM      MDM   Final diagnoses:  None   28yo sickle cell patient with likely pain crisis vs. Acute chest. EKG normal. CXR benign to my read, will f/u radiology read. CBC, retic, BMP ordered. Will give toradol and dilaudid and reassess.   Studies unremarkable. Pain improved, will d/c with PO pain meds.   Abram SanderElena M Geraldean Walen, MD 02/12/14 [id number]  Elwin MochaBlair Walden, MD 02/16/14 [phone]

## 2014-02-08 NOTE — ED Notes (Signed)
Pt states that she fell yesterday afternoon at home and when she did she hit her left hip. Pain has had some nausea no vomitting, also c/o SHOB, dizziness and syncopal episode.

## 2014-02-08 NOTE — ED Notes (Signed)
Patient transported to X-ray 

## 2014-02-08 NOTE — Discharge Instructions (Signed)
Sickle Cell Anemia, Adult °Sickle cell anemia is a condition in which red blood cells have an abnormal "sickle" shape. This abnormal shape shortens the cells' life span, which results in a lower than normal concentration of red blood cells in the blood. The sickle shape also causes the cells to clump together and block free blood flow through the blood vessels. As a result, the tissues and organs of the body do not receive enough oxygen. Sickle cell anemia causes organ damage and pain and increases the risk of infection. °CAUSES  °Sickle cell anemia is a genetic disorder. Those who receive two copies of the gene have the condition, and those who receive one copy have the trait. °RISK FACTORS °The sickle cell gene is most common in people whose families originated in [place]. Other areas of the globe where sickle cell trait occurs include the Mediterranean, [place] and [place], the [place], and the [place].  °SIGNS AND SYMPTOMS °· Pain, especially in the extremities, back, chest, or abdomen (common). The pain may start suddenly or may develop following an illness, especially if there is dehydration. Pain can also occur due to overexertion or exposure to extreme temperature changes. °· Frequent severe bacterial infections, especially certain types of pneumonia and meningitis. °· Pain and swelling in the hands and feet. °· Decreased activity.   °· Loss of appetite.   °· Change in behavior. °· Headaches. °· Seizures. °· Shortness of breath or difficulty breathing. °· Vision changes. °· Skin ulcers. °Those with the trait may not have symptoms or they may have mild symptoms.  °DIAGNOSIS  °Sickle cell anemia is diagnosed with blood tests that demonstrate the genetic trait. It is often diagnosed during the newborn period, due to mandatory testing nationwide. A variety of blood tests, X-rays, CT scans, MRI scans, ultrasounds, and lung function tests may also be done to monitor the condition. °TREATMENT  °Sickle  cell anemia may be treated with: °· Medicines. You may be given pain medicines, antibiotic medicines (to treat and prevent infections) or medicines to increase the production of certain types of hemoglobin. °· Fluids. °· Oxygen. °· Blood transfusions. °HOME CARE INSTRUCTIONS  °· Drink enough fluid to keep your urine clear or pale yellow. Increase your fluid intake in hot weather and during exercise. °· Do not smoke. Smoking lowers oxygen levels in the blood.   °· Only take over-the-counter or prescription medicines for pain, fever, or discomfort as directed by your health care provider. °· Take antibiotics as directed by your health care provider. Make sure you finish them it even if you start to feel better.   °· Take supplements as directed by your health care provider.   °· Consider wearing a medical alert bracelet. This tells anyone caring for you in an emergency of your condition.   °· When traveling, keep your medical information, health care provider's names, and the medicines you take with you at all times.   °· If you develop a fever, do not take medicines to reduce the fever right away. This could cover up a problem that is developing. Notify your health care provider. °· Keep all follow-up appointments with your health care provider. Sickle cell anemia requires regular medical care. °SEEK MEDICAL CARE IF: ° You have a fever. °SEEK IMMEDIATE MEDICAL CARE IF:  °· You feel dizzy or faint.   °· You have new abdominal pain, especially on the left side near the stomach area.   °· You develop a persistent, often uncomfortable and painful penile erection (priapism). If this is not treated immediately it   will lead to impotence.   °· You have numbness your arms or legs or you have a hard time moving them.   °· You have a hard time with speech.   °· You have a fever or persistent symptoms for more than 2-3 days.   °· You have a fever and your symptoms suddenly get worse.   °· You have signs or symptoms of infection.  These include:   °¨ Chills.   °¨ Abnormal tiredness (lethargy).   °¨ Irritability.   °¨ Poor eating.   °¨ Vomiting.   °· You develop pain that is not helped with medicine.   °· You develop shortness of breath. °· You have pain in your chest.   °· You are coughing up pus-like or bloody sputum.   °· You develop a stiff neck. °· Your feet or hands swell or have pain. °· Your abdomen appears bloated. °· You develop joint pain. °MAKE SURE YOU: °· Understand these instructions. °Document Released: 05/27/2005 Document Revised: 07/03/2013 Document Reviewed: 09/28/2012 °[employer]® Patient Information ©2015 [employer]. This information is not intended to replace advice given to you by your health care provider. Make sure you discuss any questions you have with your health care provider. ° °

## 2014-02-09 ENCOUNTER — Emergency Department (HOSPITAL_COMMUNITY): Payer: Medicaid Other

## 2014-02-09 ENCOUNTER — Encounter (HOSPITAL_COMMUNITY): Payer: Self-pay | Admitting: Emergency Medicine

## 2014-02-09 ENCOUNTER — Inpatient Hospital Stay (HOSPITAL_COMMUNITY)
Admission: EM | Admit: 2014-02-09 | Discharge: 2014-02-14 | DRG: 812 | Disposition: A | Discharge status: Home / Self Care | Payer: Medicaid Other | Attending: Internal Medicine | Admitting: Internal Medicine

## 2014-02-09 DIAGNOSIS — D5700 Hb-SS disease with crisis, unspecified: Secondary | ICD-10-CM | POA: Diagnosis present

## 2014-02-09 DIAGNOSIS — Z9049 Acquired absence of other specified parts of digestive tract: Secondary | ICD-10-CM | POA: Diagnosis present

## 2014-02-09 DIAGNOSIS — Z79899 Other long term (current) drug therapy: Secondary | ICD-10-CM | POA: Diagnosis not present

## 2014-02-09 DIAGNOSIS — G8929 Other chronic pain: Secondary | ICD-10-CM | POA: Diagnosis present

## 2014-02-09 DIAGNOSIS — F17210 Nicotine dependence, cigarettes, uncomplicated: Secondary | ICD-10-CM | POA: Diagnosis present

## 2014-02-09 DIAGNOSIS — Z9114 Patient's other noncompliance with medication regimen: Secondary | ICD-10-CM | POA: Diagnosis present

## 2014-02-09 DIAGNOSIS — E876 Hypokalemia: Secondary | ICD-10-CM

## 2014-02-09 DIAGNOSIS — G629 Polyneuropathy, unspecified: Secondary | ICD-10-CM

## 2014-02-09 DIAGNOSIS — E860 Dehydration: Secondary | ICD-10-CM | POA: Diagnosis present

## 2014-02-09 DIAGNOSIS — D638 Anemia in other chronic diseases classified elsewhere: Secondary | ICD-10-CM

## 2014-02-09 DIAGNOSIS — D72829 Elevated white blood cell count, unspecified: Secondary | ICD-10-CM | POA: Diagnosis present

## 2014-02-09 DIAGNOSIS — R079 Chest pain, unspecified: Secondary | ICD-10-CM | POA: Diagnosis present

## 2014-02-09 DIAGNOSIS — R519 Headache, unspecified: Secondary | ICD-10-CM

## 2014-02-09 DIAGNOSIS — R29898 Other symptoms and signs involving the musculoskeletal system: Secondary | ICD-10-CM

## 2014-02-09 DIAGNOSIS — R51 Headache: Secondary | ICD-10-CM

## 2014-02-09 LAB — RETICULOCYTES
RBC.: 2.14 MIL/uL — ABNORMAL LOW (ref 3.87–5.11)
Retic Count, Absolute: 321 10*3/uL — ABNORMAL HIGH (ref 19.0–186.0)
Retic Ct Pct: 15 % — ABNORMAL HIGH (ref 0.4–3.1)

## 2014-02-09 LAB — CBC
HCT: 22.4 % — ABNORMAL LOW (ref 36.0–46.0)
Hemoglobin: 8.1 g/dL — ABNORMAL LOW (ref 12.0–15.0)
MCH: 38.4 pg — ABNORMAL HIGH (ref 26.0–34.0)
MCHC: 36.2 g/dL — ABNORMAL HIGH (ref 30.0–36.0)
MCV: 106.2 fL — AB (ref 78.0–100.0)
PLATELETS: 386 10*3/uL (ref 150–400)
RBC: 2.11 MIL/uL — AB (ref 3.87–5.11)
RDW: 20.7 % — ABNORMAL HIGH (ref 11.5–15.5)
WBC: 13.6 10*3/uL — AB (ref 4.0–10.5)

## 2014-02-09 LAB — BASIC METABOLIC PANEL
ANION GAP: 14 (ref 5–15)
BUN: 7 mg/dL (ref 6–23)
CHLORIDE: 106 meq/L (ref 96–112)
CO2: 24 meq/L (ref 19–32)
CREATININE: 0.49 mg/dL — AB (ref 0.50–1.10)
Calcium: 8.9 mg/dL (ref 8.4–10.5)
GFR calc Af Amer: >90 mL/min (ref >90)
GFR calc non Af Amer: >90 mL/min (ref >90)
Glucose, Bld: 93 mg/dL (ref 70–99)
POTASSIUM: 3.2 meq/L — AB (ref 3.7–5.3)
Sodium: 144 mEq/L (ref 137–147)

## 2014-02-09 LAB — POC URINE PREG, ED
PREG TEST UR: NEGATIVE
PREG TEST UR: NEGATIVE

## 2014-02-09 LAB — I-STAT TROPONIN, ED: TROPONIN I, POC: 0 ng/mL (ref 0.00–0.08)

## 2014-02-09 MED ORDER — SODIUM CHLORIDE 0.9 % IV BOLUS (SEPSIS)
1000.0000 mL | Freq: Once | INTRAVENOUS | Status: AC
  Administered 2014-02-09: 1000 mL via INTRAVENOUS

## 2014-02-09 MED ORDER — HYDROMORPHONE HCL 1 MG/ML IJ SOLN
1.0000 mg | Freq: Once | INTRAMUSCULAR | Status: AC
  Administered 2014-02-09: 1 mg via INTRAVENOUS
  Filled 2014-02-09: qty 1

## 2014-02-09 MED ORDER — IOHEXOL 350 MG/ML SOLN
100.0000 mL | Freq: Once | INTRAVENOUS | Status: AC | PRN
  Administered 2014-02-09: 100 mL via INTRAVENOUS

## 2014-02-09 MED ORDER — LORAZEPAM 2 MG/ML IJ SOLN
1.0000 mg | Freq: Once | INTRAMUSCULAR | Status: AC
  Administered 2014-02-09: 1 mg via INTRAVENOUS
  Filled 2014-02-09: qty 1

## 2014-02-09 MED ORDER — SODIUM CHLORIDE 0.9 % IV SOLN
INTRAVENOUS | Status: DC
  Administered 2014-02-09: 23:00:00 via INTRAVENOUS

## 2014-02-09 MED ORDER — DIPHENHYDRAMINE HCL 50 MG/ML IJ SOLN
25.0000 mg | Freq: Once | INTRAMUSCULAR | Status: AC
  Administered 2014-02-09: 25 mg via INTRAVENOUS
  Filled 2014-02-09: qty 1

## 2014-02-09 NOTE — Progress Notes (Signed)
CSW spoke with patient at bedside. Patient informed CSW that she is here because of chest pain. Patient informed CSW that she was also here last night for chest pain. Patient stated that she was going to be admitted last night, but decided to not to stay. Patient says that her chest pain has increased and if she is advised to be admitted she will probably stay.  Patient informed CSW that she has sickle cell.  Patient stated that she was BIB boyfriend but he is now at work. Patient stated that she does not have any money or know of anyone who would be able to pick her up. CSW gave Taxi voucher to charge nurse for patient to use if she is discharged tonight.   Trish Mage.Tzipporah Nagorski, LCSWA [phone] ED CSW 02/09/2014 9:49 PM

## 2014-02-09 NOTE — Progress Notes (Signed)
  CARE MANAGEMENT ED NOTE 02/09/2014  Patient:  Brittany Foster,Brittany Foster   Account Number:  [id number]  Date Initiated:  02/09/2014  Documentation initiated by:  Radford PaxFERRERO,Windell Musson  Subjective/Objective Assessment:   Patient presents to Ed with chest pain with pain radiating down her right arm for three days with n/v lightheadedness, dizziness and sob.     Subjective/Objective Assessment Detail:   Patient with pmhx of sickle cell disease     Action/Plan:   Action/Plan Detail:   Anticipated DC Date:       Status Recommendation to Physician:   Result of Recommendation:    Other ED Services  Consult Working Plan   In-house referral  Clinical Social Worker   DC Associate Professorlanning Services  Other  PCP issues    Choice offered to / List presented to:            Status of service:  Completed, signed off  ED Comments:   ED Comments Detail:  EDCM spoke to patient at bedside.  Patient [place] in [place] as she likes the healthcare here better.  Patient reports her pcp is Dr. Clarene CritchleyAna Funza.  Patient reports she has an appointment with Dr. Marthann SchillerMichelle [name] on Dec 18th 2015. Patient reports all of her family is in [place].  EDCM asked patient if she was enrolled with [facility]?  Patient is unsure if she is associated with this program but was agreeable to fill out referral form to enroll.  Patient reports she requires assistance with emotional, financial, medical, prescriptions, family support and transportation.  [facility]  at [phone].  Patient thankful for services.  Patient reports [place].  [facility] consulted EDSW for transportation issues.  No further EDCM needs at this time.

## 2014-02-09 NOTE — ED Notes (Signed)
pt c/o chest pain that radiates down her left arm x 3 days. Pt states that she has radiating pain down her left arm, shob, dizziness, lightheadedness, n/v.  Pt also states that she was seen here for the same thing and she has sickle cell.

## 2014-02-09 NOTE — ED Provider Notes (Signed)
CSN: [id number]     Arrival date & time 02/09/14  1748 History   First MD Initiated Contact with Patient 02/09/14 1933     Chief Complaint  Patient presents with  . Chest Pain     (Consider location/radiation/quality/duration/timing/severity/associated sxs/prior Treatment) HPI Comments: Patient here complaining of 3 days of persistent left-sided chest pain that radiates to her left arm. Seen here yesterday for similar symptoms and given pain medication feels better. Does have a history of sickle cell and this is different than her normal pain crisis. No fever or cough. No syncope or near-syncope. No anginal type symptoms. Pain characterized as sharp and worse with certain movements. Denies any leg pain or swelling. Denies any rashes or skin. Used her home medications without relief.  Patient is a 28 y.o. female presenting with chest pain. The history is provided by the patient.  Chest Pain   Past Medical History  Diagnosis Date  . Sickle cell anemia    Past Surgical History  Procedure Laterality Date  . Tubal ligation    . Cholecystectomy    . Cesarean section     Family History  Problem Relation Age of Onset  . Sickle cell anemia Other    History  Substance Use Topics  . Smoking status: Current Some Day Smoker  . Smokeless tobacco: Not on file  . Alcohol Use: No   OB History    No data available     Review of Systems  Cardiovascular: Positive for chest pain.  All other systems reviewed and are negative.     Allergies  Ultram and Morphine and related  Home Medications   Prior to Admission medications   Medication Sig Start Date End Date Taking? Authorizing Provider  folic acid (FOLVITE) 1 MG tablet Take 1 mg by mouth daily.   Yes Historical Provider, MD  gabapentin (NEURONTIN) 100 MG capsule Take 100 mg by mouth at bedtime.   Yes Historical Provider, MD  gabapentin (NEURONTIN) 300 MG capsule Take 300 mg by mouth 4 (four) times daily.   Yes Historical Provider,  MD  hydroxyurea (HYDREA) 500 MG capsule Take 500 mg by mouth daily. May take with food to minimize GI side effects.   Yes Historical Provider, MD  Naproxen Sodium (ALEVE) 220 MG CAPS Take 660 mg by mouth every 6 (six) hours as needed (pain).   Yes Historical Provider, MD  oxyCODONE-acetaminophen (ROXICET) 5-325 MG per tablet Take 2 tablets by mouth every 4 (four) hours as needed for severe pain. 02/03/14  Yes Serina Cowperlabunmi Agboola, MD  Topiramate ER 100 MG CP24 Take 100 mg by mouth at bedtime.   Yes Historical Provider, MD   BP 107/69 mmHg  Pulse 98  Temp(Src) 99 F (37.2 C) (Oral)  Resp 22  SpO2 99%  LMP 02/04/2014 (Exact Date) Physical Exam  Constitutional: She is oriented to person, place, and time. She appears well-developed and well-nourished.  Non-toxic appearance. No distress.  HENT:  Head: Normocephalic and atraumatic.  Eyes: Conjunctivae, EOM and lids are normal. Pupils are equal, round, and reactive to light.  Neck: Normal range of motion. Neck supple. No tracheal deviation present. No thyroid mass present.  Cardiovascular: Normal rate, regular rhythm and normal heart sounds.  Exam reveals no gallop.   No murmur heard. Pulmonary/Chest: Effort normal and breath sounds normal. No stridor. No respiratory distress. She has no decreased breath sounds. She has no wheezes. She has no rhonchi. She has no rales.  Abdominal: Soft. Normal appearance and bowel  sounds are normal. She exhibits no distension. There is no tenderness. There is no rebound and no CVA tenderness.  Musculoskeletal: Normal range of motion. She exhibits no edema or tenderness.  Neurological: She is alert and oriented to person, place, and time. She has normal strength. No cranial nerve deficit or sensory deficit. GCS eye subscore is 4. GCS verbal subscore is 5. GCS motor subscore is 6.  Skin: Skin is warm and dry. No abrasion and no rash noted.  Psychiatric: She has a normal mood and affect. Her speech is normal and behavior  is normal.  Nursing note and vitals reviewed.   ED Course  Procedures (including critical care time) Labs Review Labs Reviewed  CBC  BASIC METABOLIC PANEL  RETICULOCYTES  I-STAT TROPOININ, ED  POC URINE PREG, ED    Imaging Review Dg Chest 2 View  02/09/2014   CLINICAL DATA:  28 year old female with 3 day history of chest pain radiating into her left arm accompanied by shortness of breath, dizziness and lightheadedness. Clinical history includes sickle cell anemia  EXAM: CHEST  2 VIEW  COMPARISON:  Prior chest x-ray 02/08/2014  FINDINGS: Well-positioned right IJ single-lumen port catheter. Catheter tip at the superior cavoatrial junction. Stable cardiac and mediastinal contours. The lungs are clear. No pleural effusion, pneumothorax or focal airspace consolidation. Surgical clips in her upper quadrant suggest prior cholecystectomy. Small spleen resulting in colonic interposition in the left subdiaphragmatic space. No acute osseous abnormality.  IMPRESSION: No active cardiopulmonary disease.   Electronically Signed   By: Malachy MoanHeath  McCullough M.D.   On: 02/09/2014 19:09   Dg Chest 2 View  02/08/2014   CLINICAL DATA:  28 year old female with diffuse chest pain radiating to the left upper extremity. Syncope yesterday. Current history of sickle cell disease. Initial encounter.  EXAM: CHEST  2 VIEW  COMPARISON:  01/29/2014 and earlier.  FINDINGS: Cardiomegaly is stable since the earliest comparison in September. Right chest porta cath remains in place. Subtle catheter fragment in the left innominate vein, unchanged over this series of exams.  EKG leads and wires overlie the chest. No pneumothorax, pulmonary edema, pleural effusion or confluent pulmonary opacity. Visualized tracheal air column is within normal limits. No acute osseous abnormality identified.  IMPRESSION: Stable cardiomegaly. No acute cardiopulmonary abnormality.   Electronically Signed   By: Augusto GambleLee  Hall M.D.   On: 02/08/2014 11:43      EKG Interpretation   Date/Time:  Friday February 09 2014 18:01:27 EST Ventricular Rate:  99 PR Interval:  167 QRS Duration: 88 QT Interval:  367 QTC Calculation: 471 R Axis:   51 Text Interpretation:  Sinus rhythm Baseline wander in lead(s) II III aVF  No significant change since last tracing Confirmed by Seleni Meller  MD, Johnnay Pleitez  ([id number]) on 02/09/2014 7:45:22 PM      MDM   Final diagnoses:  Chest pain   Patient given IV pain medication here and continues to complain of chest pain. Patient's chest CT negative for embolism. She remains tachycardic here and will be admitted for pain control due to sickle-cell exacerbation     Toy BakerAnthony T Ashawna Hanback, MD 02/09/14 2224

## 2014-02-09 NOTE — H&P (Signed)
[facility] History and Physical  Brittany Foster Steveson ZOX:[id number] DOB: 01/19/1986 DOA: 02/09/2014  Referring physician: Dr. Freida BusmanAllen - [facility] PCP: Shirlean KellyFRUNZA, ANA A, MD   Chief Complaint: Pain  HPI: Brittany Foster Anglada is a 28 y.o. female  3 day h/o persistent L sided CP w/ radiation to L arm. Presented to ED yesterday w/ similar symptoms and given pain medications w/o much improvement but left to take care of some family matters. Pts home Oxycodone 5mg  tabs not keepint the pain down. Aleve w/o benefit. PT feels like stress was her trigger this time. Decreased fluid intake recently. Pain from [place] crisis is typically in her chest, legs, and back.    Review of Systems:  Constitutional:  No weight loss, night sweats, Fevers, chills, fatigue.  HEENT:  No headaches, Difficulty swallowing,Tooth/dental problems,Sore throat,  No sneezing, itching, ear ache, nasal congestion, post nasal drip,  Cardio-vascular:  Per HPI GI:  No heartburn, indigestion, abdominal pain, nausea, vomiting, diarrhea, change in bowel habits, loss of appetite  Resp:  No shortness of breath with exertion or at rest. No excess mucus, no productive cough, No non-productive cough, No coughing up of blood.No change in color of mucus.No wheezing.No chest wall deformity  Skin:  no rash or lesions.  GU:  no dysuria, change in color of urine, no urgency or frequency. No flank pain.  Musculoskeletal:  Per HPI Psych:  No change in mood or affect. No depression or anxiety. No memory loss.   Past Medical History  Diagnosis Date  . Sickle cell anemia    Past Surgical History  Procedure Laterality Date  . Tubal ligation    . Cholecystectomy    . Cesarean section     Social History:  reports that she has been smoking.  She does not have any smokeless tobacco history on file. She reports that she does not drink alcohol or use illicit drugs.  Allergies  Allergen Reactions  . Ultram [Tramadol] Other (See Comments)    seizures  .  Morphine And Related Hives, Rash and Other (See Comments)    shaking    Family History  Problem Relation Age of Onset  . Sickle cell anemia Other   . Sickle cell trait Father   . Sickle cell trait Mother      Prior to Admission medications   Medication Sig Start Date End Date Taking? Authorizing Provider  folic acid (FOLVITE) 1 MG tablet Take 1 mg by mouth daily.   Yes Historical Provider, MD  gabapentin (NEURONTIN) 100 MG capsule Take 100 mg by mouth at bedtime.   Yes Historical Provider, MD  gabapentin (NEURONTIN) 300 MG capsule Take 300 mg by mouth 4 (four) times daily.   Yes Historical Provider, MD  hydroxyurea (HYDREA) 500 MG capsule Take 500 mg by mouth daily. May take with food to minimize GI side effects.   Yes Historical Provider, MD  Naproxen Sodium (ALEVE) 220 MG CAPS Take 660 mg by mouth every 6 (six) hours as needed (pain).   Yes Historical Provider, MD  oxyCODONE-acetaminophen (ROXICET) 5-325 MG per tablet Take 2 tablets by mouth every 4 (four) hours as needed for severe pain. 02/03/14  Yes Serina Cowperlabunmi Agboola, MD  Topiramate ER 100 MG CP24 Take 100 mg by mouth at bedtime.   Yes Historical Provider, MD   Physical Exam: Filed Vitals:   02/09/14 2040 02/09/14 2100 02/09/14 2230 02/09/14 2313  BP: 122/83 118/77 105/66   Pulse: 115  96   Temp:  TempSrc:      Resp: 16 16 18    Height:    5\' 1"  (1.549 m)  Weight:    50.349 kg (111 lb)  SpO2: 99%  97%     Wt Readings from Last 3 Encounters:  02/09/14 50.349 kg (111 lb)  01/29/14 54.3 kg (119 lb 11.4 oz)  01/02/14 55.293 kg (121 lb 14.4 oz)    General: appears to be in pain, lying in bed Eyes:  PERRL, normal lids, irises & conjunctiva ENT:  grossly normal hearing, lips & tongue Neck:  no LAD, masses or thyromegaly Cardiovascular:  RRR, no m/r/g. No LE edema. Telemetry:  SR, no arrhythmias  Respiratory:  CTA bilaterally, no w/r/r. Normal respiratory effort. Abdomen:  soft, ntnd Skin:  no rash or induration seen on  limited exam Musculoskeletal:  Tenderness w/ movement and deep inspriation grossly normal tone BUE/BLE Psychiatric:  grossly normal mood and affect, speech fluent and appropriate Neurologic:  grossly non-focal.          Labs on Admission:  Basic Metabolic Panel:  Recent Labs Lab 02/03/14 0505 02/08/14 1155 02/09/14 1957  NA 140 142 144  K 3.8 3.3* 3.2*  CL 108 108 106  CO2 20 20 24   GLUCOSE 94 88 93  BUN 8 4* 7  CREATININE 0.54 0.41* 0.49*  CALCIUM 8.8 9.1 8.9   Liver Function Tests: No results for input(s): AST, ALT, ALKPHOS, BILITOT, PROT, ALBUMIN in the last 168 hours. No results for input(s): LIPASE, AMYLASE in the last 168 hours. No results for input(s): AMMONIA in the last 168 hours. CBC:  Recent Labs Lab 02/03/14 0505 02/08/14 1155 02/09/14 1957  WBC 14.1* 11.7* 13.6*  NEUTROABS 9.3* 7.8*  --   HGB 6.8* 8.2* 8.1*  HCT 19.3* 23.5* 22.4*  MCV 98.5 101.7* 106.2*  PLT 277 384 386   Cardiac Enzymes:  Recent Labs Lab 02/08/14 1155  TROPONINI <0.30    BNP (last 3 results) No results for input(s): PROBNP in the last 8760 hours. CBG: No results for input(s): GLUCAP in the last 168 hours.  Radiological Exams on Admission: Dg Chest 2 View  02/09/2014   CLINICAL DATA:  28 year old female with 3 day history of chest pain radiating into her left arm accompanied by shortness of breath, dizziness and lightheadedness. Clinical history includes sickle cell anemia  EXAM: CHEST  2 VIEW  COMPARISON:  Prior chest x-ray 02/08/2014  FINDINGS: Well-positioned right IJ single-lumen port catheter. Catheter tip at the superior cavoatrial junction. Stable cardiac and mediastinal contours. The lungs are clear. No pleural effusion, pneumothorax or focal airspace consolidation. Surgical clips in her upper quadrant suggest prior cholecystectomy. Small spleen resulting in colonic interposition in the left subdiaphragmatic space. No acute osseous abnormality.  IMPRESSION: No active  cardiopulmonary disease.   Electronically Signed   By: Malachy Moan M.D.   On: 02/09/2014 19:09   Dg Chest 2 View  02/08/2014   CLINICAL DATA:  28 year old female with diffuse chest pain radiating to the left upper extremity. Syncope yesterday. Current history of sickle cell disease. Initial encounter.  EXAM: CHEST  2 VIEW  COMPARISON:  01/29/2014 and earlier.  FINDINGS: Cardiomegaly is stable since the earliest comparison in September. Right chest porta cath remains in place. Subtle catheter fragment in the left innominate vein, unchanged over this series of exams.  EKG leads and wires overlie the chest. No pneumothorax, pulmonary edema, pleural effusion or confluent pulmonary opacity. Visualized tracheal air column is within normal limits. No acute  osseous abnormality identified.  IMPRESSION: Stable cardiomegaly. No acute cardiopulmonary abnormality.   Electronically Signed   By: Augusto GambleLee  Hall M.D.   On: 02/08/2014 11:43   Ct Angio Chest Pe W/cm &/or Wo Cm  02/09/2014   CLINICAL DATA:  Mid chest pain for 3 days.  Sickle cell anemia.  EXAM: CT ANGIOGRAPHY CHEST WITH CONTRAST  TECHNIQUE: Multidetector CT imaging of the chest was performed using the standard protocol during bolus administration of intravenous contrast. Multiplanar CT image reconstructions and MIPs were obtained to evaluate the vascular anatomy.  CONTRAST:  100mL OMNIPAQUE IOHEXOL 350 MG/ML SOLN  COMPARISON:  12/30/2013  FINDINGS: Technically adequate study with good opacification of the central and segmental pulmonary arteries. No focal filling defects demonstrated. No evidence of significant pulmonary embolus.  Increased density in the anterior mediastinum consistent with residual thymic tissue. Normal heart size. Small to moderate pericardial effusion or thickening is noted. This is progressing slightly since previous study. No significant lymphadenopathy in the chest. Normal caliber thoracic aorta. No evidence of dissection. Great vessel  origins are patent. Right central venous catheter with tip in the region of the cavoatrial junction. The catheter fragment again demonstrated in the subclavian vein. Esophagus is decompressed.  Mild dependent changes in the lung bases. No focal consolidation or airspace disease. No pleural effusion. No pneumothorax. Airways appear patent.  Increased density and small size of the spleen consistent with changes of sickle cell. Included portions of the upper abdominal organs are otherwise grossly unremarkable. Mild vertebral sclerotic changes consistent with history of sickle cell disease.  Review of the MIP images confirms the above findings.  IMPRESSION: No evidence of significant pulmonary embolus. No evidence of active pulmonary disease. Catheter fragment again demonstrated in the subclavian vein. No change in position since previous study.   Electronically Signed   By: Burman NievesWilliam  Stevens M.D.   On: 02/09/2014 21:37    EKG: Independently reviewed. NSR, no sign of ACS. No change from previous  Assessment/Plan Principal Problem:   Sickle cell pain crisis Active Problems:   Chest pain   Leukocytosis   Anemia   Hypokalemia   Neuropathy   Chronic headache disorder  Sickle Cell pain crisis: typical pain presentation for pt. Hgb stable over past 2 days. Hgb 8.1, baseline 7-8. No need for tranfusion.  WBC 13.6. Retic count elevated to 15, 4th attack in past 4 mo. CT angio w/o evidence of acute consolidation or PE. Troponin negative. No sign of acute chest. Pt reports being as high as 7mg  Dilaudid before for pain control.  -  Admit - toradol - dilaudid PCA - Benadryl - O2 PRN - 1/2 NS 14250ml/hr - Lactate - continue Hydroxyruea - CBC in am  Hypokalemia: K 3.2 - Kdur 40mEq x1  Headaches: currently w/o HA - continue home Topamax  Neuropathy: - continue neurontin    Code Status: FULL DVT Prophylaxis: Hep Family Communication: daughter  Disposition Plan: pending improvement    MERRELL,  [name] J [facility] [email or web address] Password [email or web address]

## 2014-02-09 NOTE — ED Notes (Signed)
Pt states that she doesn't have any veins to why she has her port-a-cath and wants to wait until her port is accessed for her labs to be drawn.

## 2014-02-10 LAB — CBC WITH DIFFERENTIAL/PLATELET
BASOS ABS: 0.1 10*3/uL (ref 0.0–0.1)
Basophils Relative: 1 % (ref 0–1)
Eosinophils Absolute: 0.4 10*3/uL (ref 0.0–0.7)
Eosinophils Relative: 3 % (ref 0–5)
HEMATOCRIT: 20.2 % — AB (ref 36.0–46.0)
Hemoglobin: 7 g/dL — ABNORMAL LOW (ref 12.0–15.0)
LYMPHS PCT: 36 % (ref 12–46)
Lymphs Abs: 5.3 10*3/uL — ABNORMAL HIGH (ref 0.7–4.0)
MCH: 37.2 pg — ABNORMAL HIGH (ref 26.0–34.0)
MCHC: 34.7 g/dL (ref 30.0–36.0)
MCV: 107.4 fL — ABNORMAL HIGH (ref 78.0–100.0)
Monocytes Absolute: 1.6 10*3/uL — ABNORMAL HIGH (ref 0.1–1.0)
Monocytes Relative: 11 % (ref 3–12)
Neutro Abs: 7.3 10*3/uL (ref 1.7–7.7)
Neutrophils Relative %: 49 % (ref 43–77)
PLATELETS: 307 10*3/uL (ref 150–400)
RBC: 1.88 MIL/uL — ABNORMAL LOW (ref 3.87–5.11)
RDW: 20.5 % — AB (ref 11.5–15.5)
WBC: 14.7 10*3/uL — AB (ref 4.0–10.5)

## 2014-02-10 LAB — MAGNESIUM: Magnesium: 2 mg/dL (ref 1.5–2.5)

## 2014-02-10 LAB — LACTATE DEHYDROGENASE: LDH: 396 U/L — ABNORMAL HIGH (ref 94–250)

## 2014-02-10 MED ORDER — TOPIRAMATE 100 MG PO TABS
100.0000 mg | ORAL_TABLET | Freq: Two times a day (BID) | ORAL | Status: DC
  Administered 2014-02-10 – 2014-02-14 (×10): 100 mg via ORAL
  Filled 2014-02-10 (×11): qty 1

## 2014-02-10 MED ORDER — SENNOSIDES-DOCUSATE SODIUM 8.6-50 MG PO TABS
1.0000 | ORAL_TABLET | Freq: Two times a day (BID) | ORAL | Status: DC
  Administered 2014-02-10 – 2014-02-14 (×3): 1 via ORAL
  Filled 2014-02-10 (×9): qty 1

## 2014-02-10 MED ORDER — OXYCODONE-ACETAMINOPHEN 5-325 MG PO TABS
2.0000 | ORAL_TABLET | ORAL | Status: DC | PRN
Start: 2014-02-10 — End: 2014-02-14
  Administered 2014-02-10 – 2014-02-14 (×3): 2 via ORAL
  Filled 2014-02-10 (×3): qty 2

## 2014-02-10 MED ORDER — HYDROXYUREA 500 MG PO CAPS
500.0000 mg | ORAL_CAPSULE | Freq: Every day | ORAL | Status: DC
  Administered 2014-02-10 – 2014-02-14 (×5): 500 mg via ORAL
  Filled 2014-02-10 (×6): qty 1

## 2014-02-10 MED ORDER — KETOROLAC TROMETHAMINE 30 MG/ML IJ SOLN
30.0000 mg | Freq: Four times a day (QID) | INTRAMUSCULAR | Status: DC
  Administered 2014-02-10 – 2014-02-14 (×12): 30 mg via INTRAVENOUS
  Filled 2014-02-10 (×22): qty 1

## 2014-02-10 MED ORDER — ONDANSETRON HCL 4 MG/2ML IJ SOLN: 4.0000 mg | INTRAMUSCULAR | Status: DC | PRN

## 2014-02-10 MED ORDER — NALOXONE HCL 0.4 MG/ML IJ SOLN: 0.4000 mg | INTRAMUSCULAR | Status: DC | PRN

## 2014-02-10 MED ORDER — POTASSIUM CHLORIDE CRYS ER 20 MEQ PO TBCR
40.0000 meq | EXTENDED_RELEASE_TABLET | Freq: Once | ORAL | Status: AC
  Administered 2014-02-10: 40 meq via ORAL
  Filled 2014-02-10: qty 2

## 2014-02-10 MED ORDER — SODIUM CHLORIDE 0.45 % IV SOLN
INTRAVENOUS | Status: DC
  Administered 2014-02-10 – 2014-02-12 (×8): via INTRAVENOUS
  Administered 2014-02-13 – 2014-02-14 (×3): 1000 mL via INTRAVENOUS

## 2014-02-10 MED ORDER — HYDROMORPHONE 0.3 MG/ML IV SOLN
INTRAVENOUS | Status: DC
  Administered 2014-02-10: 1.22 mg via INTRAVENOUS
  Administered 2014-02-10: 01:00:00 via INTRAVENOUS
  Administered 2014-02-10: 2.1 mg via INTRAVENOUS
  Administered 2014-02-10: 5.3 mg via INTRAVENOUS
  Filled 2014-02-10 (×2): qty 25

## 2014-02-10 MED ORDER — HYDROMORPHONE 2 MG/ML HIGH CONCENTRATION IV PCA SOLN
INTRAVENOUS | Status: DC
  Administered 2014-02-10: 13:00:00 via INTRAVENOUS
  Administered 2014-02-10: 7.8 mg via INTRAVENOUS
  Administered 2014-02-10: 1 mg via INTRAVENOUS
  Administered 2014-02-10: 4 mg via INTRAVENOUS
  Administered 2014-02-11: 7.5 mg via INTRAVENOUS
  Administered 2014-02-11: 8.5 mg via INTRAVENOUS
  Administered 2014-02-11: 4 mg via INTRAVENOUS
  Administered 2014-02-11: 3.5 mg via INTRAVENOUS
  Administered 2014-02-11: 21:00:00 via INTRAVENOUS
  Administered 2014-02-11: 3.5 mg via INTRAVENOUS
  Administered 2014-02-11 – 2014-02-12 (×2): 3 mg via INTRAVENOUS
  Administered 2014-02-12: 7.5 mg via INTRAVENOUS
  Administered 2014-02-12: 3.5 mg via INTRAVENOUS
  Administered 2014-02-12: 1.5 mg via INTRAVENOUS
  Administered 2014-02-12: 9.5 mg via INTRAVENOUS
  Administered 2014-02-12: 8.5 mg via INTRAVENOUS
  Administered 2014-02-13: 3.5 mg via INTRAVENOUS
  Administered 2014-02-13: 16:00:00 via INTRAVENOUS
  Administered 2014-02-13: 3 mg via INTRAVENOUS
  Administered 2014-02-13: 4 mg via INTRAVENOUS
  Administered 2014-02-13: 5.5 mg via INTRAVENOUS
  Administered 2014-02-13: 9 mg via INTRAVENOUS
  Administered 2014-02-13: 1 mg via INTRAVENOUS
  Administered 2014-02-14: 0.5 mg via INTRAVENOUS
  Administered 2014-02-14: 7 mg via INTRAVENOUS
  Administered 2014-02-14: 4.5 mg via INTRAVENOUS
  Administered 2014-02-14: 7.5 mg via INTRAVENOUS
  Filled 2014-02-10 (×3): qty 25

## 2014-02-10 MED ORDER — GABAPENTIN 300 MG PO CAPS
300.0000 mg | ORAL_CAPSULE | Freq: Four times a day (QID) | ORAL | Status: DC
  Administered 2014-02-10 – 2014-02-14 (×19): 300 mg via ORAL
  Filled 2014-02-10 (×21): qty 1

## 2014-02-10 MED ORDER — HEPARIN SODIUM (PORCINE) 5000 UNIT/ML IJ SOLN
5000.0000 [IU] | Freq: Three times a day (TID) | INTRAMUSCULAR | Status: DC
  Administered 2014-02-10 – 2014-02-14 (×8): 5000 [IU] via SUBCUTANEOUS
  Filled 2014-02-10 (×17): qty 1

## 2014-02-10 MED ORDER — ONDANSETRON HCL 4 MG/2ML IJ SOLN
4.0000 mg | Freq: Four times a day (QID) | INTRAMUSCULAR | Status: DC | PRN
  Administered 2014-02-13: 4 mg via INTRAVENOUS
  Filled 2014-02-10: qty 2

## 2014-02-10 MED ORDER — SODIUM CHLORIDE 0.9 % IJ SOLN: 9.0000 mL | INTRAMUSCULAR | Status: DC | PRN

## 2014-02-10 MED ORDER — SODIUM CHLORIDE 0.9 % IJ SOLN
10.0000 mL | INTRAMUSCULAR | Status: DC | PRN
  Administered 2014-02-10 – 2014-02-14 (×2): 10 mL
  Filled 2014-02-10: qty 40

## 2014-02-10 MED ORDER — DIPHENHYDRAMINE HCL 50 MG/ML IJ SOLN: 12.5000 mg | Freq: Four times a day (QID) | INTRAMUSCULAR | Status: DC | PRN

## 2014-02-10 MED ORDER — ONDANSETRON HCL 4 MG PO TABS: 4.0000 mg | ORAL_TABLET | ORAL | Status: DC | PRN

## 2014-02-10 MED ORDER — FOLIC ACID 1 MG PO TABS
1.0000 mg | ORAL_TABLET | Freq: Every day | ORAL | Status: DC
  Administered 2014-02-10 – 2014-02-14 (×5): 1 mg via ORAL
  Filled 2014-02-10 (×5): qty 1

## 2014-02-10 MED ORDER — DIPHENHYDRAMINE HCL 12.5 MG/5ML PO ELIX: 12.5000 mg | ORAL_SOLUTION | Freq: Four times a day (QID) | ORAL | Status: DC | PRN

## 2014-02-10 MED ORDER — POLYETHYLENE GLYCOL 3350 17 G PO PACK
17.0000 g | PACK | Freq: Every day | ORAL | Status: DC | PRN
  Filled 2014-02-10: qty 1

## 2014-02-10 MED ORDER — HEPARIN SODIUM (PORCINE) 5000 UNIT/ML IJ SOLN: 5000.0000 [IU] | Freq: Three times a day (TID) | INTRAMUSCULAR | Status: DC

## 2014-02-10 NOTE — Progress Notes (Signed)
Subjective: A 28 year old female admitted last night with sickle cell painful crisis. Patient on Dilaudid PCA but complaining of continued pain at 9 out of 10. She denied any cough no shortness of breath no nausea vomiting or diarrhea. She has used at least 12 mg of the Dilaudid since admission. Patient was recently admitted and discharged in good health. Patient believed something is triggering her attacks lately. The change in the weather is possibly contributing according to patient. She is currently on telemetric bed however no major event has been noted.  Objective: Vital signs in last 24 hours: Temp:  [97.8 F (36.6 C)-99 F (37.2 C)] 98 F (36.7 C) (12/12 0403) Pulse Rate:  [89-115] 95 (12/12 0403) Resp:  [15-22] 20 (12/12 0920) BP: (93-122)/(54-83) 95/54 mmHg (12/12 0403) SpO2:  [96 %-100 %] 99 % (12/12 0920) Weight:  [50.349 kg (111 lb)-54.114 kg (119 lb 4.8 oz)] 54.114 kg (119 lb 4.8 oz) (12/12 0030) Weight change:  Last BM Date: 02/09/14  Intake/Output from previous day: 12/11 0701 - 12/12 0700 In: 1287.5 [P.O.:480; I.V.:807.5] Out: 1 [Urine:1] Intake/Output this shift:    General appearance: alert, cooperative and no distress Eyes: conjunctivae/corneas clear. PERRL, EOM's intact. Fundi benign. Ears: normal TM's and external ear canals both ears Neck: no adenopathy, no carotid bruit, no JVD, supple, symmetrical, trachea midline and thyroid not enlarged, symmetric, no tenderness/mass/nodules Back: symmetric, no curvature. ROM normal. No CVA tenderness. Resp: clear to auscultation bilaterally Cardio: regular rate and rhythm, S1, S2 normal, no murmur, click, rub or gallop GI: soft, non-tender; bowel sounds normal; no masses,  no organomegaly Extremities: extremities normal, atraumatic, no cyanosis or edema Pulses: 2+ and symmetric Skin: Skin color, texture, turgor normal. No rashes or lesions Neurologic: Grossly normal  Lab Results:  Recent Labs  02/09/14 1957  02/10/14 0445  WBC 13.6* 14.7*  HGB 8.1* 7.0*  HCT 22.4* 20.2*  PLT 386 307   BMET  Recent Labs  02/08/14 1155 02/09/14 1957  NA 142 144  K 3.3* 3.2*  CL 108 106  CO2 20 24  GLUCOSE 88 93  BUN 4* 7  CREATININE 0.41* 0.49*  CALCIUM 9.1 8.9    Studies/Results: Dg Chest 2 View  02/09/2014   CLINICAL DATA:  28 year old female with 3 day history of chest pain radiating into her left arm accompanied by shortness of breath, dizziness and lightheadedness. Clinical history includes sickle cell anemia  EXAM: CHEST  2 VIEW  COMPARISON:  Prior chest x-ray 02/08/2014  FINDINGS: Well-positioned right IJ single-lumen port catheter. Catheter tip at the superior cavoatrial junction. Stable cardiac and mediastinal contours. The lungs are clear. No pleural effusion, pneumothorax or focal airspace consolidation. Surgical clips in her upper quadrant suggest prior cholecystectomy. Small spleen resulting in colonic interposition in the left subdiaphragmatic space. No acute osseous abnormality.  IMPRESSION: No active cardiopulmonary disease.   Electronically Signed   By: Malachy MoanHeath  McCullough M.D.   On: 02/09/2014 19:09   Ct Angio Chest Pe W/cm &/or Wo Cm  02/09/2014   CLINICAL DATA:  Mid chest pain for 3 days.  Sickle cell anemia.  EXAM: CT ANGIOGRAPHY CHEST WITH CONTRAST  TECHNIQUE: Multidetector CT imaging of the chest was performed using the standard protocol during bolus administration of intravenous contrast. Multiplanar CT image reconstructions and MIPs were obtained to evaluate the vascular anatomy.  CONTRAST:  100mL OMNIPAQUE IOHEXOL 350 MG/ML SOLN  COMPARISON:  12/30/2013  FINDINGS: Technically adequate study with good opacification of the central and segmental pulmonary arteries. No  focal filling defects demonstrated. No evidence of significant pulmonary embolus.  Increased density in the anterior mediastinum consistent with residual thymic tissue. Normal heart size. Small to moderate pericardial  effusion or thickening is noted. This is progressing slightly since previous study. No significant lymphadenopathy in the chest. Normal caliber thoracic aorta. No evidence of dissection. Great vessel origins are patent. Right central venous catheter with tip in the region of the cavoatrial junction. The catheter fragment again demonstrated in the subclavian vein. Esophagus is decompressed.  Mild dependent changes in the lung bases. No focal consolidation or airspace disease. No pleural effusion. No pneumothorax. Airways appear patent.  Increased density and small size of the spleen consistent with changes of sickle cell. Included portions of the upper abdominal organs are otherwise grossly unremarkable. Mild vertebral sclerotic changes consistent with history of sickle cell disease.  Review of the MIP images confirms the above findings.  IMPRESSION: No evidence of significant pulmonary embolus. No evidence of active pulmonary disease. Catheter fragment again demonstrated in the subclavian vein. No change in position since previous study.   Electronically Signed   By: Burman NievesWilliam  Stevens M.D.   On: 02/09/2014 21:37    Medications: I have reviewed the patient's current medications.  Assessment/Plan: 28 year old female admitted with sickle cell painful crisis.  #1 sickle cell painful crisis: Pain involves the rib cage and patient has complained of chest pain on arrival. Workup so far showed no evidence of PE or heart disease. We will therefore continue her Dilaudid PCA with her oral medications and titrate as needed. Continue Toradol also. Will transfer patient to MedSurg.  #2 hypokalemia: We will replete potassium.  #3 dehydration: We'll hydrate patient aggressively.  #4 sickle cell anemia: H&H looks stable. We'll continue to monitor.  LOS: 1 day   GARBA,LAWAL 02/10/2014, 11:23 AM

## 2014-02-10 NOTE — Progress Notes (Signed)
Patient transferred to 1324 via wheelchair with Huntley DecSara, [facility]. Report called to Victorino DikeJennifer, RN at 2034.

## 2014-02-11 LAB — COMPREHENSIVE METABOLIC PANEL
ALBUMIN: 3.2 g/dL — AB (ref 3.5–5.2)
ALK PHOS: 97 U/L (ref 39–117)
ALT: 17 U/L (ref 0–35)
ANION GAP: 10 (ref 5–15)
AST: 29 U/L (ref 0–37)
BUN: 5 mg/dL — ABNORMAL LOW (ref 6–23)
CO2: 21 mEq/L (ref 19–32)
Calcium: 8.6 mg/dL (ref 8.4–10.5)
Chloride: 108 mEq/L (ref 96–112)
Creatinine, Ser: 0.49 mg/dL — ABNORMAL LOW (ref 0.50–1.10)
GFR calc Af Amer: >90 mL/min (ref >90)
GFR calc non Af Amer: >90 mL/min (ref >90)
Glucose, Bld: 94 mg/dL (ref 70–99)
POTASSIUM: 3.7 meq/L (ref 3.7–5.3)
Sodium: 139 mEq/L (ref 137–147)
Total Bilirubin: 1.9 mg/dL — ABNORMAL HIGH (ref 0.3–1.2)
Total Protein: 6.5 g/dL (ref 6.0–8.3)

## 2014-02-11 LAB — CBC WITH DIFFERENTIAL/PLATELET
BASOS ABS: 0.2 10*3/uL — AB (ref 0.0–0.1)
Basophils Relative: 1 % (ref 0–1)
EOS ABS: 1.1 10*3/uL — AB (ref 0.0–0.7)
Eosinophils Relative: 7 % — ABNORMAL HIGH (ref 0–5)
HCT: 20.5 % — ABNORMAL LOW (ref 36.0–46.0)
Hemoglobin: 7.3 g/dL — ABNORMAL LOW (ref 12.0–15.0)
Lymphocytes Relative: 33 % (ref 12–46)
Lymphs Abs: 5 10*3/uL — ABNORMAL HIGH (ref 0.7–4.0)
MCH: 36.9 pg — ABNORMAL HIGH (ref 26.0–34.0)
MCHC: 35.6 g/dL (ref 30.0–36.0)
MCV: 103.5 fL — ABNORMAL HIGH (ref 78.0–100.0)
MONOS PCT: 9 % (ref 3–12)
Monocytes Absolute: 1.4 10*3/uL — ABNORMAL HIGH (ref 0.1–1.0)
NEUTROS PCT: 50 % (ref 43–77)
Neutro Abs: 7.4 10*3/uL (ref 1.7–7.7)
PLATELETS: 317 10*3/uL (ref 150–400)
RBC: 1.98 MIL/uL — ABNORMAL LOW (ref 3.87–5.11)
RDW: 20.2 % — ABNORMAL HIGH (ref 11.5–15.5)
WBC: 15.1 10*3/uL — AB (ref 4.0–10.5)

## 2014-02-11 NOTE — Progress Notes (Signed)
Subjective: Patient is still complaining of 9 out of 10 pain involving rib cage and back. She has been on the Dilaudid PCA low dose. I have switched her to high-dose PCA where she used 22 mg in the last 24 hours. She also has Toradol ordered in addition to her IV fluids. She is in tears this morning and appeared to have a lot of emotional issues which may be making her pain worse. No shortness of breath, no cough, no nausea vomiting or diarrhea.  Objective: Vital signs in last 24 hours: Temp:  [97.8 F (36.6 C)-98.3 F (36.8 C)] 98.2 F (36.8 C) (12/13 0600) Pulse Rate:  [70-102] 102 (12/13 0600) Resp:  [11-20] 12 (12/13 0800) BP: (89-99)/(53-62) 99/58 mmHg (12/13 0600) SpO2:  [96 %-100 %] 99 % (12/13 0800) FiO2 (%):  [0 %-21 %] 21 % (12/13 0800) Weight:  [54.432 kg (120 lb)] 54.432 kg (120 lb) (12/13 0600) Weight change: 4.082 kg (9 lb) Last BM Date: 02/09/14  Intake/Output from previous day:   Intake/Output this shift:    General appearance: alert, cooperative and no distress Eyes: conjunctivae/corneas clear. PERRL, EOM's intact. Fundi benign. Ears: normal TM's and external ear canals both ears Neck: no adenopathy, no carotid bruit, no JVD, supple, symmetrical, trachea midline and thyroid not enlarged, symmetric, no tenderness/mass/nodules Back: symmetric, no curvature. ROM normal. No CVA tenderness. Resp: clear to auscultation bilaterally Cardio: regular rate and rhythm, S1, S2 normal, no murmur, click, rub or gallop GI: soft, non-tender; bowel sounds normal; no masses,  no organomegaly Extremities: extremities normal, atraumatic, no cyanosis or edema Pulses: 2+ and symmetric Skin: Skin color, texture, turgor normal. No rashes or lesions Neurologic: Grossly normal  Lab Results:  Recent Labs  02/10/14 0445 02/11/14 0515  WBC 14.7* 15.1*  HGB 7.0* 7.3*  HCT 20.2* 20.5*  PLT 307 317   BMET  Recent Labs  02/09/14 1957 02/11/14 0515  NA 144 139  K 3.2* 3.7  CL 106  108  CO2 24 21  GLUCOSE 93 94  BUN 7 5*  CREATININE 0.49* 0.49*  CALCIUM 8.9 8.6    Studies/Results: Dg Chest 2 View  02/09/2014   CLINICAL DATA:  28 year old female with 3 day history of chest pain radiating into her left arm accompanied by shortness of breath, dizziness and lightheadedness. Clinical history includes sickle cell anemia  EXAM: CHEST  2 VIEW  COMPARISON:  Prior chest x-ray 02/08/2014  FINDINGS: Well-positioned right IJ single-lumen port catheter. Catheter tip at the superior cavoatrial junction. Stable cardiac and mediastinal contours. The lungs are clear. No pleural effusion, pneumothorax or focal airspace consolidation. Surgical clips in her upper quadrant suggest prior cholecystectomy. Small spleen resulting in colonic interposition in the left subdiaphragmatic space. No acute osseous abnormality.  IMPRESSION: No active cardiopulmonary disease.   Electronically Signed   By: Malachy MoanHeath  McCullough M.D.   On: 02/09/2014 19:09   Ct Angio Chest Pe W/cm &/or Wo Cm  02/09/2014   CLINICAL DATA:  Mid chest pain for 3 days.  Sickle cell anemia.  EXAM: CT ANGIOGRAPHY CHEST WITH CONTRAST  TECHNIQUE: Multidetector CT imaging of the chest was performed using the standard protocol during bolus administration of intravenous contrast. Multiplanar CT image reconstructions and MIPs were obtained to evaluate the vascular anatomy.  CONTRAST:  100mL OMNIPAQUE IOHEXOL 350 MG/ML SOLN  COMPARISON:  12/30/2013  FINDINGS: Technically adequate study with good opacification of the central and segmental pulmonary arteries. No focal filling defects demonstrated. No evidence of significant pulmonary embolus.  Increased density in the anterior mediastinum consistent with residual thymic tissue. Normal heart size. Small to moderate pericardial effusion or thickening is noted. This is progressing slightly since previous study. No significant lymphadenopathy in the chest. Normal caliber thoracic aorta. No evidence of  dissection. Great vessel origins are patent. Right central venous catheter with tip in the region of the cavoatrial junction. The catheter fragment again demonstrated in the subclavian vein. Esophagus is decompressed.  Mild dependent changes in the lung bases. No focal consolidation or airspace disease. No pleural effusion. No pneumothorax. Airways appear patent.  Increased density and small size of the spleen consistent with changes of sickle cell. Included portions of the upper abdominal organs are otherwise grossly unremarkable. Mild vertebral sclerotic changes consistent with history of sickle cell disease.  Review of the MIP images confirms the above findings.  IMPRESSION: No evidence of significant pulmonary embolus. No evidence of active pulmonary disease. Catheter fragment again demonstrated in the subclavian vein. No change in position since previous study.   Electronically Signed   By: Burman NievesWilliam  Stevens M.D.   On: 02/09/2014 21:37    Medications: I have reviewed the patient's current medications.  Assessment/Plan: 28 year old female admitted with sickle cell painful crisis.  #1 sickle cell painful crisis: Patient will be maintain on Dilaudid PCA and supportive care. Have counseled her on the use of her PCA appropriately. We'll restart her oral medications as well in the morning. Continue Toradol also.   #2 hypokalemia: Repleted.  #3 dehydration: We'll hydrate patient aggressively.  #4 sickle cell anemia: H&H looks stable. We'll continue to monitor.  LOS: 2 days   Lachlyn Vanderstelt,LAWAL 02/11/2014, 8:46 AM

## 2014-02-12 LAB — COMPREHENSIVE METABOLIC PANEL
ALK PHOS: 100 U/L (ref 39–117)
ALT: 16 U/L (ref 0–35)
ANION GAP: 10 (ref 5–15)
AST: 31 U/L (ref 0–37)
Albumin: 3.2 g/dL — ABNORMAL LOW (ref 3.5–5.2)
BILIRUBIN TOTAL: 2.1 mg/dL — AB (ref 0.3–1.2)
BUN: 5 mg/dL — AB (ref 6–23)
CHLORIDE: 110 meq/L (ref 96–112)
CO2: 20 mEq/L (ref 19–32)
Calcium: 8.4 mg/dL (ref 8.4–10.5)
Creatinine, Ser: 0.49 mg/dL — ABNORMAL LOW (ref 0.50–1.10)
GFR calc non Af Amer: >90 mL/min (ref >90)
Glucose, Bld: 103 mg/dL — ABNORMAL HIGH (ref 70–99)
POTASSIUM: 3.5 meq/L — AB (ref 3.7–5.3)
Sodium: 140 mEq/L (ref 137–147)
Total Protein: 6.5 g/dL (ref 6.0–8.3)

## 2014-02-12 LAB — CBC WITH DIFFERENTIAL/PLATELET
Basophils Absolute: 0.2 10*3/uL — ABNORMAL HIGH (ref 0.0–0.1)
Basophils Relative: 1 % (ref 0–1)
EOS ABS: 0.9 10*3/uL — AB (ref 0.0–0.7)
Eosinophils Relative: 6 % — ABNORMAL HIGH (ref 0–5)
HCT: 19.5 % — ABNORMAL LOW (ref 36.0–46.0)
HEMOGLOBIN: 7 g/dL — AB (ref 12.0–15.0)
LYMPHS PCT: 28 % (ref 12–46)
Lymphs Abs: 4.3 10*3/uL — ABNORMAL HIGH (ref 0.7–4.0)
MCH: 37 pg — ABNORMAL HIGH (ref 26.0–34.0)
MCHC: 35.9 g/dL (ref 30.0–36.0)
MCV: 103.2 fL — AB (ref 78.0–100.0)
MONOS PCT: 7 % (ref 3–12)
Monocytes Absolute: 1.1 10*3/uL — ABNORMAL HIGH (ref 0.1–1.0)
NEUTROS ABS: 8.7 10*3/uL — AB (ref 1.7–7.7)
Neutrophils Relative %: 58 % (ref 43–77)
Platelets: 305 10*3/uL (ref 150–400)
RBC: 1.89 MIL/uL — ABNORMAL LOW (ref 3.87–5.11)
RDW: 21.5 % — ABNORMAL HIGH (ref 11.5–15.5)
WBC: 15.2 10*3/uL — ABNORMAL HIGH (ref 4.0–10.5)

## 2014-02-12 MED ORDER — POTASSIUM CHLORIDE CRYS ER 20 MEQ PO TBCR
20.0000 meq | EXTENDED_RELEASE_TABLET | Freq: Two times a day (BID) | ORAL | Status: DC
  Administered 2014-02-12 – 2014-02-14 (×5): 20 meq via ORAL
  Filled 2014-02-12 (×6): qty 1

## 2014-02-12 NOTE — Progress Notes (Signed)
Clinical Social Work Department BRIEF PSYCHOSOCIAL ASSESSMENT 02/12/2014  Patient:  Brittany Foster, Brittany Foster     Account Number:  [id number]     Admit date:  02/09/2014  Clinical Social Worker:  Maryln Manuel  Date/Time:  02/12/2014 11:34 AM  Referred by:  Physician  Date Referred:  02/12/2014 Referred for  Other - See comment   Other Referral:   other psychosocial needs   Interview type:  Patient Other interview type:    PSYCHOSOCIAL DATA Living Status:  ALONE Admitted from facility:   Level of care:   Primary support name:  Brittany Foster/mother/[phone] Primary support relationship to patient:  PARENT Degree of support available:   lacking    CURRENT CONCERNS Current Concerns  Other - See comment   Other Concerns:   other psychosocial needs    SOCIAL WORK ASSESSMENT / PLAN CSW received referral that pt needing resources for transportation as pt has poor social support.    CSW met with pt at bedside. CSW introduced self and explained role. Pt shared that she lives in [place] and came to hospital by pt boyfriend. CSW discussed resources for transportation including Medicaid Transportation and pt states that she is enrolled with Medicaid Transportation in [place].Pt shared that she is unsure if she will have transportation once medically stable for discharge and states that when she was in the ED that she was going to be provided a taxi voucher home. CSW discussed with pt that the need for transportation will have to be assessed on the day of discharge and if pt is enrolled with Medicaid transporation then there is a potential that Medicaid transportation can provide transport to pt at discharge. Pt expressed understanding.    CSW to continue to follow to provide support and assist with transportation needs at d/c if needed.   Assessment/plan status:  Psychosocial Support/Ongoing Assessment of Needs Other assessment/ plan:   Information/referral to community  resources:   no referrals at this time, pt states that she is already enrolled with Medicaid transportation    PATIENT'S/FAMILY'S RESPONSE TO PLAN OF CARE: Pt alert and oriented x 4. Pt engaged in conversation. Pt states she will explore options for transportation when discharged, but unsure if she will be able to arrange transportation at discharge. Per MD note, pt not yet medically ready today. CSW will continue to assess pt needs for transportation once pt medically ready for discharge.    Alison Murray, MSW, [facility] [phone]

## 2014-02-12 NOTE — Progress Notes (Signed)
Subjective: Patient is still complaining of 8 out of 10 pain but has been refusing Toradol since yesterday. She claimed that total does a walk for her. She is still emotionally distressed and worried. She remains on Dilaudid PCA using 24.5 mg in the last 24 hours. No cough no shortness of breath. No fever no nausea vomiting or diarrhea. She remains intubated not getting out of bed due to pain.  Objective: Vital signs in last 24 hours: Temp:  [97.3 F (36.3 C)-98.6 F (37 C)] 98.6 F (37 C) (12/14 0552) Pulse Rate:  [81-95] 95 (12/14 0552) Resp:  [10-18] 12 (12/14 0749) BP: (93-101)/(56-60) 101/57 mmHg (12/14 0552) SpO2:  [94 %-100 %] 96 % (12/14 0749) FiO2 (%):  [21 %] 21 % (12/14 0749) Weight change:  Last BM Date: 02/09/14  Intake/Output from previous day: 12/13 0701 - 12/14 0700 In: 1025 [P.O.:720; I.V.:305] Out: 4 [Urine:4] Intake/Output this shift:    General appearance: alert, cooperative and no distress Eyes: conjunctivae/corneas clear. PERRL, EOM's intact. Fundi benign. Ears: normal TM's and external ear canals both ears Neck: no adenopathy, no carotid bruit, no JVD, supple, symmetrical, trachea midline and thyroid not enlarged, symmetric, no tenderness/mass/nodules Back: symmetric, no curvature. ROM normal. No CVA tenderness. Resp: clear to auscultation bilaterally Cardio: regular rate and rhythm, S1, S2 normal, no murmur, click, rub or gallop GI: soft, non-tender; bowel sounds normal; no masses,  no organomegaly Extremities: extremities normal, atraumatic, no cyanosis or edema Pulses: 2+ and symmetric Skin: Skin color, texture, turgor normal. No rashes or lesions Neurologic: Grossly normal  Lab Results:  Recent Labs  02/11/14 0515 02/12/14 0600  WBC 15.1* 15.2*  HGB 7.3* 7.0*  HCT 20.5* 19.5*  PLT 317 305   BMET  Recent Labs  02/11/14 0515 02/12/14 0600  NA 139 140  K 3.7 3.5*  CL 108 110  CO2 21 20  GLUCOSE 94 103*  BUN 5* 5*  CREATININE 0.49*  0.49*  CALCIUM 8.6 8.4    Studies/Results: No results found.  Medications: I have reviewed the patient's current medications.  Assessment/Plan: 28 year old female admitted with sickle cell painful crisis.  #1 sickle cell painful crisis: Patient has been counseled as she needs additional medication like the Toradol to continue getting better. I've also counseling other part of her problems are emotional. Her hemoglobin stays stable.   #2 hypokalemia: Replete.  #3 dehydration: Patient is well hydrated. We will reduce IV fluids to 75 ml/hr.  #4 sickle cell anemia: H&H looks stable. We'll continue to monitor.  #5 emotional distress: Patient has a lot of stress which may be causing her symptoms to be exacerbated. I've discussed with her the need for her to open up and get help.  LOS: 3 days   Harish Bram,LAWAL 02/12/2014, 10:41 AM

## 2014-02-13 DIAGNOSIS — R208 Other disturbances of skin sensation: Secondary | ICD-10-CM

## 2014-02-13 LAB — COMPREHENSIVE METABOLIC PANEL
ALK PHOS: 97 U/L (ref 39–117)
ALT: 19 U/L (ref 0–35)
AST: 39 U/L — ABNORMAL HIGH (ref 0–37)
Albumin: 3.2 g/dL — ABNORMAL LOW (ref 3.5–5.2)
Anion gap: 13 (ref 5–15)
BUN: 7 mg/dL (ref 6–23)
CO2: 19 mEq/L (ref 19–32)
Calcium: 8.8 mg/dL (ref 8.4–10.5)
Chloride: 110 mEq/L (ref 96–112)
Creatinine, Ser: 0.51 mg/dL (ref 0.50–1.10)
GFR calc Af Amer: >90 mL/min (ref >90)
GFR calc non Af Amer: >90 mL/min (ref >90)
Glucose, Bld: 93 mg/dL (ref 70–99)
POTASSIUM: 4.1 meq/L (ref 3.7–5.3)
SODIUM: 142 meq/L (ref 137–147)
TOTAL PROTEIN: 6.6 g/dL (ref 6.0–8.3)
Total Bilirubin: 2.2 mg/dL — ABNORMAL HIGH (ref 0.3–1.2)

## 2014-02-13 LAB — CBC WITH DIFFERENTIAL/PLATELET
Basophils Absolute: 0 10*3/uL (ref 0.0–0.1)
Basophils Relative: 0 % (ref 0–1)
EOS ABS: 0.5 10*3/uL (ref 0.0–0.7)
Eosinophils Relative: 4 % (ref 0–5)
HCT: 20.8 % — ABNORMAL LOW (ref 36.0–46.0)
Hemoglobin: 7.2 g/dL — ABNORMAL LOW (ref 12.0–15.0)
LYMPHS PCT: 25 % (ref 12–46)
Lymphs Abs: 3.4 10*3/uL (ref 0.7–4.0)
MCH: 35 pg — ABNORMAL HIGH (ref 26.0–34.0)
MCHC: 34.6 g/dL (ref 30.0–36.0)
MCV: 101 fL — ABNORMAL HIGH (ref 78.0–100.0)
Monocytes Absolute: 1.1 10*3/uL — ABNORMAL HIGH (ref 0.1–1.0)
Monocytes Relative: 8 % (ref 3–12)
NEUTROS PCT: 63 % (ref 43–77)
Neutro Abs: 8.7 10*3/uL — ABNORMAL HIGH (ref 1.7–7.7)
Platelets: 329 10*3/uL (ref 150–400)
RBC: 2.06 MIL/uL — ABNORMAL LOW (ref 3.87–5.11)
RDW: 21.7 % — AB (ref 11.5–15.5)
WBC: 13.7 10*3/uL — ABNORMAL HIGH (ref 4.0–10.5)

## 2014-02-13 LAB — LACTATE DEHYDROGENASE: LDH: 367 U/L — ABNORMAL HIGH (ref 94–250)

## 2014-02-13 MED ORDER — HYDROMORPHONE HCL 1 MG/ML IJ SOLN
INTRAMUSCULAR | Status: AC
  Filled 2014-02-13: qty 1
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
## 2014-02-13 NOTE — Consult Note (Signed)
Consult Reason for Consult:left upper extremity paresthesias Referring Physician: Dr Ashley RoyaltyMatthews  CC: "I can't feel my left arm"  HPI: Brittany CampbellMiranda Foster is an 28 y.o. female with a history of Sickle Cell Disease who presents to the ED with complaints of worsening pain over the past 3 days in her chest , back and in her legs.Admitted for sickle cell crisis, currently receiving symptomatic treatment. This morning she acutely noted numbness in her LUE. Described as both a loss of sensation and a "pins and needles sensation". She also notes weakness in the LUE.   Past Medical History  Diagnosis Date  . Sickle cell anemia     Past Surgical History  Procedure Laterality Date  . Tubal ligation    . Cholecystectomy    . Cesarean section      Family History  Problem Relation Age of Onset  . Sickle cell anemia Other   . Sickle cell trait Father   . Sickle cell trait Mother     Social History:  reports that she has been smoking.  She does not have any smokeless tobacco history on file. She reports that she does not drink alcohol or use illicit drugs.  Allergies  Allergen Reactions  . Ultram [Tramadol] Other (See Comments)    seizures  . Morphine And Related Hives, Rash and Other (See Comments)    shaking    Medications:  Scheduled: . folic acid  1 mg Oral Daily  . gabapentin  300 mg Oral QID  . heparin  5,000 Units Subcutaneous 3 times per day  . HYDROmorphone PCA 2 mg/mL   Intravenous 6 times per day  . hydroxyurea  500 mg Oral Daily  . ketorolac  30 mg Intravenous 4 times per day  . potassium chloride  20 mEq Oral BID  . senna-docusate  1 tablet Oral BID  . topiramate  100 mg Oral BID     ROS: Out of a complete 14 system review, the patient complains of only the following symptoms, and all other reviewed systems are negative. +pain, weakness, numbness  Physical Examination: Filed Vitals:   02/13/14 1250  BP: 89/48  Pulse: 91  Temp: 98 F (36.7 C)  Resp: 15   Physical  Exam  Constitutional: He appears well-developed and well-nourished.  Psych: Affect appropriate to situation Eyes: No scleral injection HENT: No OP obstrucion Head: Normocephalic.  Cardiovascular: Normal rate and regular rhythm.  Respiratory: Effort normal and breath sounds normal.  GI: Soft. Bowel sounds are normal. No distension. There is no tenderness.  Skin: WDI  Neurologic Examination Mental Status: Alert, oriented, thought content appropriate.  Speech fluent without evidence of aphasia.  Able to follow 3 step commands without difficulty. Cranial Nerves: II: funduscopic exam wnl bilaterally, visual fields grossly normal, pupils equal, round, reactive to light and accommodation III,IV, VI: ptosis not present, extra-ocular motions intact bilaterally V,VII: smile symmetric, facial light touch sensation normal bilaterally VIII: hearing normal bilaterally IX,X: gag reflex present XI: trapezius strength/neck flexion strength normal bilaterally XII: tongue strength normal  Motor: Right upper extremity 5/5 strength Able to lift LUE against gravity and light resistance when distracted. When asked to formally test strength of LUE she states she is unable to move it RLE 5/5 strength LLE 2/5 proximal to 5/5 distal though question effort with + Hoovers sign Sensory: Absent LT and PP in LUE and LLE Deep Tendon Reflexes: 2+ and symmetric throughout Plantars: Right: downgoing   Left: downgoing Gait: deferred due to weakness (of note  patient reports she can get up to use the restroom even though on exam she is unable to even lift LLE against gravity  Laboratory Studies:   Basic Metabolic Panel:  Recent Labs Lab 02/08/14 1155 02/09/14 1957 02/10/14 0001 02/11/14 0515 02/12/14 0600 02/13/14 0630  NA 142 144  --  139 140 142  K 3.3* 3.2*  --  3.7 3.5* 4.1  CL 108 106  --  108 110 110  CO2 20 24  --  21 20 19   GLUCOSE 88 93  --  94 103* 93  BUN 4* 7  --  5* 5* 7  CREATININE  0.41* 0.49*  --  0.49* 0.49* 0.51  CALCIUM 9.1 8.9  --  8.6 8.4 8.8  MG  --   --  2.0  --   --   --     Liver Function Tests:  Recent Labs Lab 02/11/14 0515 02/12/14 0600 02/13/14 0630  AST 29 31 39*  ALT 17 16 19   ALKPHOS 97 100 97  BILITOT 1.9* 2.1* 2.2*  PROT 6.5 6.5 6.6  ALBUMIN 3.2* 3.2* 3.2*   No results for input(s): LIPASE, AMYLASE in the last 168 hours. No results for input(s): AMMONIA in the last 168 hours.  CBC:  Recent Labs Lab 02/08/14 1155 02/09/14 1957 02/10/14 0445 02/11/14 0515 02/12/14 0600 02/13/14 0630  WBC 11.7* 13.6* 14.7* 15.1* 15.2* 13.7*  NEUTROABS 7.8*  --  7.3 7.4 8.7* 8.7*  HGB 8.2* 8.1* 7.0* 7.3* 7.0* 7.2*  HCT 23.5* 22.4* 20.2* 20.5* 19.5* 20.8*  MCV 101.7* 106.2* 107.4* 103.5* 103.2* 101.0*  PLT 384 386 307 317 305 329    Cardiac Enzymes:  Recent Labs Lab 02/08/14 1155  TROPONINI <0.30    BNP: Invalid input(s): POCBNP  CBG: No results for input(s): GLUCAP in the last 168 hours.  Microbiology: No results found for this or any previous visit.  Coagulation Studies: No results for input(s): LABPROT, INR in the last 72 hours.  Urinalysis: No results for input(s): COLORURINE, LABSPEC, PHURINE, GLUCOSEU, HGBUR, BILIRUBINUR, KETONESUR, PROTEINUR, UROBILINOGEN, NITRITE, LEUKOCYTESUR in the last 168 hours.  Invalid input(s): APPERANCEUR  Lipid Panel:  No results found for: CHOL, TRIG, HDL, CHOLHDL, VLDL, LDLCALC  HgbA1C: No results found for: HGBA1C  Urine Drug Screen:     Component Value Date/Time   LABOPIA POSITIVE* 12/31/2013 0015   COCAINSCRNUR NONE DETECTED 12/31/2013 0015   LABBENZ NONE DETECTED 12/31/2013 0015   AMPHETMU NONE DETECTED 12/31/2013 0015   THCU NONE DETECTED 12/31/2013 0015   LABBARB NONE DETECTED 12/31/2013 0015    Alcohol Level: No results for input(s): ETH in the last 168 hours.   Imaging: No results found.   Assessment/Plan:  28y/o woman with history of sickle cell disease, currently  admitted for pain crisis. Neurology consulted after acute onset of left sided weakness and sensory changes. Based on exam suspect there is a functional overlay to her symptoms. This will be a diagnosis of exclusion as with her history of sickle cell she is at increased risk of a vascular process. Will check MRI brain without contrast. If negative no further neurological workup indicated at this time.   Elspeth Choeter Corsica Franson, DO [facility] [phone]  If 7pm- 7am, please page neurology on call as listed in AMION. 02/13/2014, 2:19 PM

## 2014-02-13 NOTE — Progress Notes (Signed)
Patient is on a high dose Dilaudid PCA,at [id number],patient had used 27.5 mg- total drug,64- total demands,delivered- 55.-Myleah Cavendish RN

## 2014-02-14 ENCOUNTER — Inpatient Hospital Stay (HOSPITAL_COMMUNITY): Payer: Medicaid Other

## 2014-02-14 MED ORDER — SENNOSIDES-DOCUSATE SODIUM 8.6-50 MG PO TABS: 1.0000 | ORAL_TABLET | Freq: Two times a day (BID) | ORAL | Status: DC

## 2014-02-14 MED ORDER — HEPARIN SOD (PORK) LOCK FLUSH 100 UNIT/ML IV SOLN
500.0000 [IU] | INTRAVENOUS | Status: AC | PRN
  Administered 2014-02-14: 500 [IU]
  Filled 2014-02-14: qty 5

## 2014-02-14 MED ORDER — GABAPENTIN 300 MG PO CAPS: 300.0000 mg | ORAL_CAPSULE | Freq: Three times a day (TID) | ORAL | Status: DC

## 2014-02-14 MED ORDER — OXYCODONE-ACETAMINOPHEN 10-325 MG PO TABS: 1.0000 | ORAL_TABLET | Freq: Four times a day (QID) | ORAL | Status: DC | PRN

## 2014-02-14 MED ORDER — HYDROMORPHONE HCL 1 MG/ML IJ SOLN
1.0000 mg | Freq: Once | INTRAMUSCULAR | Status: AC
  Administered 2014-02-14: 1 mg via INTRAVENOUS
  Filled 2014-02-14: qty 1

## 2014-02-14 MED ORDER — HYDROXYUREA 500 MG PO CAPS: 500.0000 mg | ORAL_CAPSULE | Freq: Every day | ORAL | Status: DC

## 2014-02-14 NOTE — Progress Notes (Signed)
Patient with SBP in 80's. Patient sleeping but arouses. Previous vitals last few days shows SBP also in 80's. Patient asymptomatic. Other vitals stable. Will monitor.

## 2014-02-14 NOTE — Plan of Care (Signed)
Problem: Phase I Progression Outcomes Goal: Pulmonary Hygiene as Indicated (Sickle Cell) Outcome: Progressing Given IS for use.      

## 2014-02-14 NOTE — Progress Notes (Signed)
Patient used 22.5 mg total drug in 24 hours,still on high dose concentration Dilaudid PCA,total demand-54,delivered 45.- Hulda Marinonna Sofhia Ulibarri RN

## 2014-02-14 NOTE — Progress Notes (Signed)
Spoke to NP Schorr about giving IV bolus of dilaudid prior to MRI since patient will be off the pump for about 30-40 minutes. Orders received for one time order of max of dilaudid 1 mg IV.

## 2014-02-14 NOTE — Discharge Summary (Signed)
Brittany Foster MRN: [id number] DOB/AGE: 29/03/1985 29 y.o.  Admit date: 02/09/2014 Discharge date: 02/14/2014  Primary Care Physician:  Shirlean KellyFRUNZA, ANA A, MD   Discharge Diagnoses:   Patient Active Problem List   Diagnosis Date Noted  . Anemia 02/09/2014  . Hypokalemia 02/09/2014  . Neuropathy 02/09/2014  . Chronic headache disorder 02/09/2014  . Leukocytosis 01/29/2014  . Pain in the chest   . Abnormal CT scan, chest   . Sickle cell anemia with pain 12/30/2013  . Chest pain 12/30/2013  . Sickle cell pain crisis 12/30/2013    DISCHARGE MEDICATION:   Medication List    STOP taking these medications        oxyCODONE-acetaminophen 5-325 MG per tablet  Commonly known as:  ROXICET  Replaced by:  oxyCODONE-acetaminophen 10-325 MG per tablet      TAKE these medications        ALEVE 220 MG Caps  Generic drug:  Naproxen Sodium  Take 660 mg by mouth every 6 (six) hours as needed (pain).     folic acid 1 MG tablet  Commonly known as:  FOLVITE  Take 1 mg by mouth daily.     gabapentin 300 MG capsule  Commonly known as:  NEURONTIN  Take 1 capsule (300 mg total) by mouth 3 (three) times daily.     hydroxyurea 500 MG capsule  Commonly known as:  HYDREA  Take 1 capsule (500 mg total) by mouth daily. May take with food to minimize GI side effects.     oxyCODONE-acetaminophen 10-325 MG per tablet  Commonly known as:  PERCOCET  Take 1 tablet by mouth every 6 (six) hours as needed for pain.     senna-docusate 8.6-50 MG per tablet  Commonly known as:  Senokot-S  Take 1 tablet by mouth 2 (two) times daily.     Topiramate ER 100 MG Cp24  Take 100 mg by mouth at bedtime.          Consults:  Neurology: Dr. Hosie PoissonSumner.   SIGNIFICANT DIAGNOSTIC STUDIES:  Dg Chest 2 View  02/09/2014   CLINICAL DATA:  28 year old female with 3 day history of chest pain radiating into her left arm accompanied by shortness of breath, dizziness and lightheadedness. Clinical history includes sickle  cell anemia  EXAM: CHEST  2 VIEW  COMPARISON:  Prior chest x-ray 02/08/2014  FINDINGS: Well-positioned right IJ single-lumen port catheter. Catheter tip at the superior cavoatrial junction. Stable cardiac and mediastinal contours. The lungs are clear. No pleural effusion, pneumothorax or focal airspace consolidation. Surgical clips in her upper quadrant suggest prior cholecystectomy. Small spleen resulting in colonic interposition in the left subdiaphragmatic space. No acute osseous abnormality.  IMPRESSION: No active cardiopulmonary disease.   Electronically Signed   By: Malachy MoanHeath  McCullough M.D.   On: 02/09/2014 19:09   Dg Chest 2 View  02/08/2014   CLINICAL DATA:  28 year old female with diffuse chest pain radiating to the left upper extremity. Syncope yesterday. Current history of sickle cell disease. Initial encounter.  EXAM: CHEST  2 VIEW  COMPARISON:  01/29/2014 and earlier.  FINDINGS: Cardiomegaly is stable since the earliest comparison in September. Right chest porta cath remains in place. Subtle catheter fragment in the left innominate vein, unchanged over this series of exams.  EKG leads and wires overlie the chest. No pneumothorax, pulmonary edema, pleural effusion or confluent pulmonary opacity. Visualized tracheal air column is within normal limits. No acute osseous abnormality identified.  IMPRESSION: Stable cardiomegaly. No acute cardiopulmonary abnormality.  Electronically Signed   By: Augusto Gamble M.D.   On: 02/08/2014 11:43   Dg Chest 2 View  01/29/2014   CLINICAL DATA:  Chest and back pain, leg pain. Likely sickle cell crisis.  EXAM: CHEST  2 VIEW  COMPARISON:  Chest radiograph March 01, 2014  FINDINGS: The cardiac silhouette is mildly enlarged, mediastinal silhouette is unremarkable. No pleural effusions or focal consolidations. No pneumothorax.  Single-lumen RIGHT chest Port-A-Cath with distal tip at cavoatrial junction. Catheter fragment again noted, which was identified within the LEFT  innominate vein on prior CT. No pneumothorax. Soft tissue planes and included osseous structures are nonsuspicious. Surgical clips in the included right abdomen likely reflect cholecystectomy.  IMPRESSION: Stable mild cardiomegaly, no acute pulmonary process.   Electronically Signed   By: Awilda Metro   On: 01/29/2014 01:42   Ct Angio Chest Pe W/cm &/or Wo Cm  02/09/2014   CLINICAL DATA:  Mid chest pain for 3 days.  Sickle cell anemia.  EXAM: CT ANGIOGRAPHY CHEST WITH CONTRAST  TECHNIQUE: Multidetector CT imaging of the chest was performed using the standard protocol during bolus administration of intravenous contrast. Multiplanar CT image reconstructions and MIPs were obtained to evaluate the vascular anatomy.  CONTRAST:  OMNIPAQUE IOHEXOL 350 MG/ML SOLN  COMPARISON:  12/30/2013  FINDINGS: Technically adequate study with good opacification of the central and segmental pulmonary arteries. No focal filling defects demonstrated. No evidence of significant pulmonary embolus.  Increased density in the anterior mediastinum consistent with residual thymic tissue. Normal heart size. Small to moderate pericardial effusion or thickening is noted. This is progressing slightly since previous study. No significant lymphadenopathy in the chest. Normal caliber thoracic aorta. No evidence of dissection. Great vessel origins are patent. Right central venous catheter with tip in the region of the cavoatrial junction. The catheter fragment again demonstrated in the subclavian vein. Esophagus is decompressed.  Mild dependent changes in the lung bases. No focal consolidation or airspace disease. No pleural effusion. No pneumothorax. Airways appear patent.  Increased density and small size of the spleen consistent with changes of sickle cell. Included portions of the upper abdominal organs are otherwise grossly unremarkable. Mild vertebral sclerotic changes consistent with history of sickle cell disease.  Review of the  MIP images confirms the above findings.  IMPRESSION: No evidence of significant pulmonary embolus. No evidence of active pulmonary disease. Catheter fragment again demonstrated in the subclavian vein. No change in position since previous study.   Electronically Signed   By: Burman Nieves M.D.   On: 02/09/2014 21:37   Mr Brain Wo Contrast  02/14/2014   CLINICAL DATA:  Left arm weakness. Possible stroke. History of sickle cell anemia.  EXAM: MRI HEAD WITHOUT CONTRAST  TECHNIQUE: Multiplanar, multiecho pulse sequences of the brain and surrounding structures were obtained without intravenous contrast.  COMPARISON:  None.  FINDINGS: Bone marrow signal of the skull and visualized upper cervical spine is diffusely diminished with widening of the diploic space of the skull, consistent with history of sickle cell anemia.  Minimal cerebellar tonsillar ectopia of 3 mm is noted. There is no evidence of acute infarct, intracranial hemorrhage, mass, midline shift, or extra-axial fluid collection. Ventricles and sulci are normal. A few punctate foci of T2 hyperintensity are noted in the cerebral white matter bilaterally with slightly asymmetric, mild T2 hyperintensity in the periventricular white matter about the left atrium.  Orbits are unremarkable. Paranasal sinuses and mastoid air cells are clear. Major intracranial vascular flow voids are  preserved.  IMPRESSION: 1. No acute intracranial abnormality. 2. Scattered foci of mild white matter T2 signal abnormality, nonspecific. 3. Minimal cerebellar tonsillar ectopia. 4. Osseous changes consistent with sickle cell anemia.   Electronically Signed   By: Sebastian AcheAllen  Grady   On: 02/14/2014 13:34      BRIEF ADMITTING H & P: Brittany Foster is a 28 y.o. female 3 day h/o persistent L sided CP w/ radiation to L arm. Presented to ED yesterday w/ similar symptoms and given pain medications w/o much improvement but left to take care of some family matters. Pts home Oxycodone 5mg  tabs  not keepint the pain down. Aleve w/o benefit. PT feels like stress was her trigger this time. Decreased fluid intake recently. Pain from [place] crisis is typically in her chest, legs, and back.    Hospital Course:  Present on Admission:  . Sicklecell pain crisis: Pt presented to the ED because she had no more pain medication and essentially made up a "fantastic story" so that she could be admitted for management of pain. I have had a long discussion with patient and she admits to as much. She was managed with Dilaudid via PCA. She was prescribed Toradol and had several instances when she refused. However she ultimately accepted care. She was transitioned to oral medications and is being discharged on Percocet 10-325 mg q 6 hours PRN. #60 tabs. We will see Ms [name] in the office on Friday as a new evaluation. She understands that this is on a trial basis as her attitude and behaviors are risky at best. She has been instructed to bring her medication in for pill count at that time.  . C/O left sided numbness and weakness: during the hospitalization, she c/o numbness and weakness of the LLE and lUE. However on observation her function appeared to be quite normal. However, owing to the risk of stroke in Sickle Cell Disease a neurology consult was obtained. An MRI of the brain was negative for CVA and Neurology recommended no further evaluation. She was able to ambulate around the unit without assistance and a mildly antalgic gait pattern. She still c/o numbness of LUE but is able to use her LUE without difficulty. This will be followed up as an out-patient.    Disposition and Follow-up:  Pt is discharged in stable condition and is [facility]2014 for an initial evaluation with NP Julianne HandlerLaChina [name].    DISCHARGE EXAM:  General: Alert, awake, oriented x3, in no distress. Boyfriend present at bedside. Vital Signs: BP 88/35, pulse 71, T 98.6 F (37 C), temperature  source Oral, RR14, height 5' (1.524 m), weight 120 lb (54.432 kg), last menstrual period 02/04/2014, SpO2 97 %. HEENT: /AT PEERL, EOMI, anicteric. Neck: Trachea midline, no masses, no thyromegal,y no JVD, no carotid bruit OROPHARYNX: Moist, No exudate/ erythema/lesions.  Heart: Regular rate and rhythm, without murmurs, rubs, gallops, PMI non-displaced.  Lungs: Clear to auscultation, no wheezing or rhonchi noted.  Abdomen: Soft, nontender, nondistended, positive bowel sounds, no masses no hepatosplenomegaly noted.  Neuro: No focal neurological deficits noted cranial nerves II through XII grossly intact. DTRs 2+ bilaterally upper and lower extremities. Musculoskeletal: No warm swelling or erythema around joints, no spinal tenderness noted. Psychiatric: Patient alert and oriented x3, good insight and cognition, good recent to remote recall. Lymph node survey: No cervical axillary or inguinal lymphadenopathy noted.      Recent Labs  02/12/14 0600 02/13/14 0630  NA  140 142  K 3.5* 4.1  CL 110 110  CO2 20 19  GLUCOSE 103* 93  BUN 5* 7  CREATININE 0.49* 0.51  CALCIUM 8.4 8.8    Recent Labs  02/12/14 0600 02/13/14 0630  AST 31 39*  ALT 16 19  ALKPHOS 100 97  BILITOT 2.1* 2.2*  PROT 6.5 6.6  ALBUMIN 3.2* 3.2*   No results for input(s): LIPASE, AMYLASE in the last 72 hours.  Recent Labs  02/12/14 0600 02/13/14 0630  WBC 15.2* 13.7*  NEUTROABS 8.7* 8.7*  HGB 7.0* 7.2*  HCT 19.5* 20.8*  MCV 103.2* 101.0*  PLT 305 329   Total time spent including face to face and decision making was greater than 30 minutes.   Signed: [name],[name] 02/14/2014, 3:00 PM

## 2014-02-14 NOTE — Plan of Care (Signed)
Problem: Phase I Progression Outcomes Goal: Pain controlled with appropriate interventions Outcome: Progressing Patient on PCA . Refused toradol and percocet. Rating pain 8-9/10 in chest/back/legs. Texting and talking on the phone. Lying in bed. Wanted additional bolus IV Dilaudid but none ordered.

## 2014-02-14 NOTE — Progress Notes (Signed)
PCA discontinued. Dilauded 2512ml/24mg  wasted in sink. Jill SideWtnessed by Kathreen Cosier. Baldwin, RN

## 2014-02-16 ENCOUNTER — Ambulatory Visit (INDEPENDENT_AMBULATORY_CARE_PROVIDER_SITE_OTHER): Payer: Medicaid Other | Admitting: Family Medicine

## 2014-02-16 VITALS — BP 121/77 | HR 107 | Temp 98.5°F | Resp 16 | Ht 60.0 in | Wt 113.0 lb

## 2014-02-16 DIAGNOSIS — F17200 Nicotine dependence, unspecified, uncomplicated: Secondary | ICD-10-CM

## 2014-02-16 DIAGNOSIS — Z23 Encounter for immunization: Secondary | ICD-10-CM

## 2014-02-16 DIAGNOSIS — Z720 Tobacco use: Secondary | ICD-10-CM

## 2014-02-16 DIAGNOSIS — D571 Sickle-cell disease without crisis: Secondary | ICD-10-CM

## 2014-02-16 DIAGNOSIS — R29898 Other symptoms and signs involving the musculoskeletal system: Secondary | ICD-10-CM

## 2014-02-16 DIAGNOSIS — Z202 Contact with and (suspected) exposure to infections with a predominantly sexual mode of transmission: Secondary | ICD-10-CM

## 2014-02-16 MED ORDER — FOLIC ACID 1 MG PO TABS: 1.0000 mg | ORAL_TABLET | Freq: Every day | ORAL | Status: DC

## 2014-02-16 NOTE — Progress Notes (Signed)
Subjective:    Patient ID: Brittany Foster, female    DOB: 20-Dec-1985, 28 y.o.   MRN: [id number]  HPI [name], a 28 year old female with a history of sickle cell anemia, HbSS presents to establish care. Brittany Foster was discharged from [name] after a sickle cell crisis on 02/14/2014.  She states that she was previously a patient of Brittany Foster, Dr. Colin Mulders. She states that she relocated to the area greater than 1 year ago.   Brittany Foster is currently complaining of left arm pain with occasional numbness and weakness.  Pain is located primarily in left upper arm. She states that she was unable to move arm earlier this morning. She states that she is now able to move arm minimally. She reports that left upper arm is increasing in pain. She last had Percocet 10-325 mg this morning around 6 am. Left arm pain is described as shooting, and is intermittent . Associated symptoms include occasional numbness and weakness.   Past Medical History  Diagnosis Date  . Sickle cell anemia    Review of Systems  Constitutional: Negative.  Negative for fever and fatigue.  HENT: Negative.   Eyes: Negative.   Respiratory: Negative.   Cardiovascular: Negative.   Gastrointestinal: Negative.   Endocrine: Negative.   Genitourinary: Negative.  Negative for dysuria, urgency, hematuria, decreased urine volume, difficulty urinating, genital sores and pelvic pain.  Musculoskeletal: Positive for myalgias (left arm pain).  Skin: Negative.   Allergic/Immunologic: Negative.   Neurological: Positive for numbness (occasionally to left arm). Negative for dizziness, tremors and syncope.  Hematological: Negative.   Psychiatric/Behavioral: Negative.  The patient is not nervous/anxious.        Objective:   Physical Exam  Constitutional: She is oriented to person, place, and time.  HENT:  Right Ear: Hearing, tympanic membrane, external ear and ear canal normal.  Left Ear: Hearing, tympanic  membrane, external ear and ear canal normal.  Nose: Nose normal.  Mouth/Throat: Uvula is midline, oropharynx is clear and moist and mucous membranes are normal.  Eyes: Conjunctivae, EOM and lids are normal. Pupils are equal, round, and reactive to light. Lids are everted and swept, no foreign bodies found.  Neck: Normal range of motion.  Cardiovascular: Normal rate, regular rhythm, normal heart sounds and intact distal pulses.   Pulmonary/Chest: Effort normal and breath sounds normal.  Abdominal: Soft. Normal appearance.  Musculoskeletal:       Left shoulder: She exhibits decreased range of motion, pain and decreased strength. She exhibits normal pulse.       Left elbow: She exhibits decreased range of motion. She exhibits no swelling, no effusion and no deformity.       Left hand: Decreased strength noted.  Neurological: She is alert and oriented to person, place, and time. She has normal reflexes. She displays no atrophy and no tremor. A cranial nerve deficit and sensory deficit (Left upper arm, decreased sensation to light touch) is present. She exhibits normal muscle tone. She displays a negative Romberg sign.  Skin: Skin is warm and dry.  Psychiatric: She has a normal mood and affect. Her behavior is normal. Judgment and thought content normal.      BP 121/77 mmHg  Pulse 107  Temp(Src) 98.5 F (36.9 C) (Oral)  Resp 16  Ht 5' (1.524 m)  Wt 113 lb (51.256 kg)  BMI 22.07 kg/m2  LMP 02/04/2014 (Exact Date)    Assessment & Plan:  1.  Hb-SS  disease without crisis  Brittany Foster is to continue Hydrea 500 mg twice daily. We discussed the need for good hydration, monitoring of hydration status, avoidance of heat, cold, stress, and infection triggers. We discussed the risks and benefits of Hydrea, including bone marrow suppression, the possibility of GI upset, skin ulcers, hair thinning, and teratogenicity. The patient was reminded of the need to seek medical attention of any symptoms of  bleeding, anemia, or infection. Continue folic acid 1 mg daily to prevent aplastic bone marrow crises.  Pulmonary evaluation - Patient denies severe recurrent wheezes, shortness of breath with exercise, or persistent cough. If these symptoms develop, pulmonary function tests with spirometry will be ordered, and if abnormal, plan on referral to Pulmonology for further evaluation.  Cardiac - Routine screening for pulmonary hypertension is not recommended.  Eye - High risk of proliferative retinopathy. Annual eye exam with retinal exam recommended to patient. She states that she has not had an exam in several years. I will send a referral.   Immunization status -  Yearly influenza vaccination is recommended, as well as being up to date with Meningococcal and Pneumococcal vaccines. She will need a Tdap today.   Acute and chronic painful episodes - We agreed on a specific medication regimen.  We discussed that she is to receive her Schedule II prescriptions only from us. She is also aware that her prescription history is available to us online through the [facility]. Controlled substance agreement signed 02/16/2014. We reminded Brittany Foster that all patients receiving Schedule II narcotics must be seen for follow up every three months. We reviewed the terms of our pain agreement, including the need to keep medicines in a safe locked location away from children or pets, and the need to report excess sedation or constipation, measures to avoid constipation, and policies related to early refills and stolen prescriptions. According to the  Chronic Pain Initiative program, we have reviewed details related to analgesia, adverse effects, aberrant behaviors. Brittany Foster received 60 pills at hospital discharge on 02/14/2014. She currently has 54 pills per count.   Iron overload from chronic transfusion.  She is currently not on iron chelation therapy.   Vitamin D deficiency - I will check vitamin D level   The above  recommendations are taken from the NIH Evidence-Based Management of Sickle Cell Disease: Expert Panel Report, [id number].   - folic acid (FOLVITE) 1 MG tablet; Take 1 tablet (1 mg total) by mouth daily. (Patient not taking: Reported on 02/16/2014)  Dispense: 30 tablet; Refill: 11 - Vitamin D, 25-hydroxy - CBC with Differential - COMPLETE METABOLIC PANEL WITH GFR - Hemoglobinopathy evaluation - Ambulatory referral to Ophthalmology.   2. Left arm weakness Ms. Pain complains of left arm weakness and occasional left arm numbness. She moves arm well, with minimal weakness. Decrease sensation to light touch on left upper extremity. She has weak left had grip strength. Brittany Foster had a brain MRI prior to hospital discharge on 02/14/2014, which showed no intercranial abnormalities. She complains of numbness to LUE, but I observed her lifting purse without difficulty. I will send a referral  for a neurological evaluation.  - Ambulatory referral to Neurology  3. Possible exposure to STD Brittany Foster reports that she has been having sexual intercourse with a new partner occasionally without barrier protection. She currently denies fever, vaginal odor, vaginal discharge, pelvic pain, abdominal pain, dysuria, or dyspareunia.  - RPR - HIV antibody (with reflex) - GC/Chlamydia Probe Amp  4. Tobacco use  disorder Patient reports that she periodically smokes Black& Mild cigars. She states that she primarily smokes during stressful situations. We discussed the importance of smoking cessation as it relates to sickle cell disease.   5. Need for Tdap vaccination - Tdap vaccine greater than or equal to 7yo IM Reviewed previous vaccinations, patient up to date.    Massie MaroonHollis,Lior Cartelli M, FNP

## 2014-02-17 LAB — GC/CHLAMYDIA PROBE AMP
CT Probe RNA: NEGATIVE
GC Probe RNA: NEGATIVE

## 2014-02-20 ENCOUNTER — Encounter: Payer: Self-pay | Admitting: Family Medicine

## 2014-03-09 ENCOUNTER — Telehealth: Payer: Self-pay | Admitting: Internal Medicine

## 2014-03-09 MED ORDER — OXYCODONE-ACETAMINOPHEN 10-325 MG PO TABS: 1.0000 | ORAL_TABLET | Freq: Four times a day (QID) | ORAL | Status: DC | PRN

## 2014-03-09 NOTE — Telephone Encounter (Signed)
Meds ordered this encounter  Medications  . oxyCODONE-acetaminophen (PERCOCET) 10-325 MG per tablet    Sig: Take 1 tablet by mouth every 6 (six) hours as needed for pain.    Dispense:  60 tablet    Refill:  0    Order Specific Question:  Supervising Provider    Answer:  [name], MICHELLE A [[id number]  Reviewed [place] Substance Reporting system prior to reorder  Leelynn Whetsel M, FNP 

## 2014-03-09 NOTE — Telephone Encounter (Signed)
Refill request for Percocet 10/325mg. LOV 02/16/2014. Please advise. Thanks!  

## 2014-03-15 ENCOUNTER — Telehealth (HOSPITAL_COMMUNITY): Payer: Self-pay | Admitting: *Deleted

## 2014-03-15 ENCOUNTER — Encounter (HOSPITAL_COMMUNITY): Payer: Self-pay | Admitting: *Deleted

## 2014-03-15 NOTE — Telephone Encounter (Signed)
Pt called complaining of chest pain radiating down left arm, vomiting, mild abd pain.  Told pt I would call back after speaking with Dr. Mikeal HawthorneGarba.

## 2014-03-15 NOTE — Telephone Encounter (Signed)
Spoke with Dr. Mikeal HawthorneGarba who advises pt to report to ED, relayed information to pt who verbalized understanding.

## 2014-03-18 ENCOUNTER — Inpatient Hospital Stay (HOSPITAL_COMMUNITY)
Admission: EM | Admit: 2014-03-18 | Discharge: 2014-03-28 | DRG: 812 | Disposition: A | Discharge status: Home / Self Care | Payer: Medicaid Other | Attending: Internal Medicine | Admitting: Internal Medicine

## 2014-03-18 ENCOUNTER — Emergency Department (HOSPITAL_COMMUNITY): Payer: Medicaid Other

## 2014-03-18 ENCOUNTER — Encounter (HOSPITAL_COMMUNITY): Payer: Self-pay | Admitting: Emergency Medicine

## 2014-03-18 DIAGNOSIS — I313 Pericardial effusion (noninflammatory): Secondary | ICD-10-CM | POA: Diagnosis present

## 2014-03-18 DIAGNOSIS — F17210 Nicotine dependence, cigarettes, uncomplicated: Secondary | ICD-10-CM | POA: Diagnosis present

## 2014-03-18 DIAGNOSIS — R079 Chest pain, unspecified: Secondary | ICD-10-CM

## 2014-03-18 DIAGNOSIS — R0602 Shortness of breath: Secondary | ICD-10-CM | POA: Diagnosis not present

## 2014-03-18 DIAGNOSIS — D72829 Elevated white blood cell count, unspecified: Secondary | ICD-10-CM | POA: Diagnosis present

## 2014-03-18 DIAGNOSIS — Z9119 Patient's noncompliance with other medical treatment and regimen: Secondary | ICD-10-CM | POA: Diagnosis present

## 2014-03-18 DIAGNOSIS — E860 Dehydration: Secondary | ICD-10-CM | POA: Diagnosis present

## 2014-03-18 DIAGNOSIS — Z9049 Acquired absence of other specified parts of digestive tract: Secondary | ICD-10-CM | POA: Diagnosis present

## 2014-03-18 DIAGNOSIS — Z79899 Other long term (current) drug therapy: Secondary | ICD-10-CM | POA: Diagnosis not present

## 2014-03-18 DIAGNOSIS — E876 Hypokalemia: Secondary | ICD-10-CM | POA: Diagnosis present

## 2014-03-18 DIAGNOSIS — R52 Pain, unspecified: Secondary | ICD-10-CM

## 2014-03-18 DIAGNOSIS — J90 Pleural effusion, not elsewhere classified: Secondary | ICD-10-CM

## 2014-03-18 DIAGNOSIS — I3139 Other pericardial effusion (noninflammatory): Secondary | ICD-10-CM | POA: Insufficient documentation

## 2014-03-18 DIAGNOSIS — D5700 Hb-SS disease with crisis, unspecified: Secondary | ICD-10-CM | POA: Diagnosis not present

## 2014-03-18 DIAGNOSIS — G8929 Other chronic pain: Secondary | ICD-10-CM | POA: Diagnosis present

## 2014-03-18 DIAGNOSIS — G629 Polyneuropathy, unspecified: Secondary | ICD-10-CM | POA: Diagnosis present

## 2014-03-18 DIAGNOSIS — R0600 Dyspnea, unspecified: Secondary | ICD-10-CM

## 2014-03-18 LAB — CBC WITH DIFFERENTIAL/PLATELET
Basophils Absolute: 0 10*3/uL (ref 0.0–0.1)
Basophils Relative: 0 % (ref 0–1)
EOS ABS: 0 10*3/uL (ref 0.0–0.7)
Eosinophils Relative: 0 % (ref 0–5)
HEMATOCRIT: 26.8 % — AB (ref 36.0–46.0)
Hemoglobin: 9.2 g/dL — ABNORMAL LOW (ref 12.0–15.0)
LYMPHS ABS: 4.5 10*3/uL — AB (ref 0.7–4.0)
LYMPHS PCT: 37 % (ref 12–46)
MCH: 35.1 pg — AB (ref 26.0–34.0)
MCHC: 34.3 g/dL (ref 30.0–36.0)
MCV: 102.3 fL — ABNORMAL HIGH (ref 78.0–100.0)
Monocytes Absolute: 1.1 10*3/uL — ABNORMAL HIGH (ref 0.1–1.0)
Monocytes Relative: 9 % (ref 3–12)
Neutro Abs: 6.6 10*3/uL (ref 1.7–7.7)
Neutrophils Relative %: 54 % (ref 43–77)
Platelets: 307 10*3/uL (ref 150–400)
RBC: 2.62 MIL/uL — ABNORMAL LOW (ref 3.87–5.11)
RDW: 17.6 % — AB (ref 11.5–15.5)
WBC: 12.3 10*3/uL — AB (ref 4.0–10.5)

## 2014-03-18 LAB — BASIC METABOLIC PANEL
ANION GAP: 8 (ref 5–15)
BUN: <5 mg/dL — ABNORMAL LOW (ref 6–23)
CALCIUM: 8.8 mg/dL (ref 8.4–10.5)
CO2: 23 mmol/L (ref 19–32)
Chloride: 108 mEq/L (ref 96–112)
Creatinine, Ser: 0.39 mg/dL — ABNORMAL LOW (ref 0.50–1.10)
GFR calc Af Amer: >90 mL/min (ref >90)
GFR calc non Af Amer: >90 mL/min (ref >90)
GLUCOSE: 86 mg/dL (ref 70–99)
Potassium: 2.8 mmol/L — ABNORMAL LOW (ref 3.5–5.1)
Sodium: 139 mmol/L (ref 135–145)

## 2014-03-18 LAB — POC URINE PREG, ED: Preg Test, Ur: NEGATIVE

## 2014-03-18 LAB — BRAIN NATRIURETIC PEPTIDE: B Natriuretic Peptide: 78.1 pg/mL (ref 0.0–100.0)

## 2014-03-18 LAB — URINALYSIS, ROUTINE W REFLEX MICROSCOPIC
Bilirubin Urine: NEGATIVE
Glucose, UA: NEGATIVE mg/dL
HGB URINE DIPSTICK: NEGATIVE
Ketones, ur: NEGATIVE mg/dL
LEUKOCYTES UA: NEGATIVE
NITRITE: NEGATIVE
PH: 6.5 (ref 5.0–8.0)
PROTEIN: NEGATIVE mg/dL
Specific Gravity, Urine: 1.014 (ref 1.005–1.030)
Urobilinogen, UA: 1 mg/dL (ref 0.0–1.0)

## 2014-03-18 LAB — HEPATIC FUNCTION PANEL
ALBUMIN: 4.3 g/dL (ref 3.5–5.2)
ALT: 28 U/L (ref 0–35)
AST: 45 U/L — ABNORMAL HIGH (ref 0–37)
Alkaline Phosphatase: 94 U/L (ref 39–117)
Bilirubin, Direct: 0.4 mg/dL — ABNORMAL HIGH (ref 0.0–0.3)
Indirect Bilirubin: 1.9 mg/dL — ABNORMAL HIGH (ref 0.3–0.9)
TOTAL PROTEIN: 7.6 g/dL (ref 6.0–8.3)
Total Bilirubin: 2.3 mg/dL — ABNORMAL HIGH (ref 0.3–1.2)

## 2014-03-18 LAB — RETICULOCYTES
RBC.: 2.62 MIL/uL — ABNORMAL LOW (ref 3.87–5.11)
RETIC COUNT ABSOLUTE: 434.9 10*3/uL — AB (ref 19.0–186.0)
Retic Ct Pct: 16.6 % — ABNORMAL HIGH (ref 0.4–3.1)

## 2014-03-18 LAB — TROPONIN I: Troponin I: <0.03 ng/mL (ref <0.031)

## 2014-03-18 MED ORDER — HYDROMORPHONE HCL 2 MG/ML IJ SOLN
2.0000 mg | Freq: Once | INTRAMUSCULAR | Status: AC
  Administered 2014-03-18: 2 mg via INTRAVENOUS
  Filled 2014-03-18: qty 1

## 2014-03-18 MED ORDER — SODIUM CHLORIDE 0.9 % IJ SOLN: 9.0000 mL | INTRAMUSCULAR | Status: DC | PRN

## 2014-03-18 MED ORDER — HEPARIN SODIUM (PORCINE) 5000 UNIT/ML IJ SOLN
5000.0000 [IU] | Freq: Three times a day (TID) | INTRAMUSCULAR | Status: DC
  Administered 2014-03-23: 5000 [IU] via SUBCUTANEOUS
  Filled 2014-03-18 (×32): qty 1

## 2014-03-18 MED ORDER — IOHEXOL 350 MG/ML SOLN
100.0000 mL | Freq: Once | INTRAVENOUS | Status: AC | PRN
  Administered 2014-03-18: 100 mL via INTRAVENOUS

## 2014-03-18 MED ORDER — POTASSIUM CHLORIDE CRYS ER 20 MEQ PO TBCR
40.0000 meq | EXTENDED_RELEASE_TABLET | Freq: Once | ORAL | Status: AC
Start: 2014-03-18 — End: 2014-03-18
  Administered 2014-03-18: 40 meq via ORAL
  Filled 2014-03-18: qty 2

## 2014-03-18 MED ORDER — POTASSIUM CHLORIDE 10 MEQ/100ML IV SOLN
10.0000 meq | INTRAVENOUS | Status: AC
  Administered 2014-03-19 (×4): 10 meq via INTRAVENOUS
  Filled 2014-03-18 (×6): qty 100

## 2014-03-18 MED ORDER — SODIUM CHLORIDE 0.9 % IJ SOLN: 3.0000 mL | Freq: Two times a day (BID) | INTRAMUSCULAR | Status: DC

## 2014-03-18 MED ORDER — POTASSIUM CHLORIDE IN NACL 20-0.9 MEQ/L-% IV SOLN
INTRAVENOUS | Status: DC
  Administered 2014-03-19: via INTRAVENOUS
  Filled 2014-03-18 (×2): qty 1000

## 2014-03-18 MED ORDER — HYDROMORPHONE 0.3 MG/ML IV SOLN
INTRAVENOUS | Status: DC
  Administered 2014-03-19: 4.5 mg via INTRAVENOUS
  Administered 2014-03-19: 0.5 mg via INTRAVENOUS
  Administered 2014-03-19: 08:00:00 via INTRAVENOUS
  Administered 2014-03-19: 2.4 mg via INTRAVENOUS
  Administered 2014-03-19: 0.3 mg via INTRAVENOUS
  Administered 2014-03-19: 0.5 mg via INTRAVENOUS
  Filled 2014-03-18 (×2): qty 25

## 2014-03-18 MED ORDER — ONDANSETRON HCL 4 MG/2ML IJ SOLN
4.0000 mg | Freq: Four times a day (QID) | INTRAMUSCULAR | Status: DC | PRN
Start: 2014-03-18 — End: 2014-03-19

## 2014-03-18 MED ORDER — NALOXONE HCL 0.4 MG/ML IJ SOLN: 0.4000 mg | INTRAMUSCULAR | Status: DC | PRN

## 2014-03-18 MED ORDER — GABAPENTIN 300 MG PO CAPS
300.0000 mg | ORAL_CAPSULE | Freq: Three times a day (TID) | ORAL | Status: DC
Start: 2014-03-18 — End: 2014-03-28
  Administered 2014-03-19 – 2014-03-27 (×28): 300 mg via ORAL
  Filled 2014-03-18 (×31): qty 1

## 2014-03-18 MED ORDER — DIPHENHYDRAMINE HCL 50 MG/ML IJ SOLN: 12.5000 mg | Freq: Four times a day (QID) | INTRAMUSCULAR | Status: DC | PRN

## 2014-03-18 MED ORDER — TOPIRAMATE 25 MG PO TABS
50.0000 mg | ORAL_TABLET | Freq: Two times a day (BID) | ORAL | Status: DC
  Administered 2014-03-19 – 2014-03-27 (×19): 50 mg via ORAL
  Filled 2014-03-18 (×23): qty 2

## 2014-03-18 MED ORDER — TOPIRAMATE ER 100 MG PO CAP24: 100.0000 mg | ORAL_CAPSULE | Freq: Every day | ORAL | Status: DC

## 2014-03-18 MED ORDER — DIPHENHYDRAMINE HCL 12.5 MG/5ML PO ELIX: 12.5000 mg | ORAL_SOLUTION | Freq: Four times a day (QID) | ORAL | Status: DC | PRN

## 2014-03-18 MED ORDER — PROMETHAZINE HCL 25 MG/ML IJ SOLN
25.0000 mg | Freq: Once | INTRAMUSCULAR | Status: AC
  Administered 2014-03-18: 25 mg via INTRAVENOUS
  Filled 2014-03-18: qty 1

## 2014-03-18 NOTE — ED Notes (Signed)
Adult friends at bedside.  Pt medicated

## 2014-03-18 NOTE — ED Notes (Signed)
Awake. Verbally responsive. A/O x4. Resp even and unlabored. No audible adventitious breath sounds noted. ABC's intact. Family at bedside. 

## 2014-03-18 NOTE — ED Notes (Signed)
Awake. Verbally responsive. A/O x4. Resp even and unlabored. No audible adventitious breath sounds noted. ABC's intact.  

## 2014-03-18 NOTE — ED Notes (Signed)
Awake. Verbally responsive. A/O x4. Resp even and unlabored. No audible adventitious breath sounds noted. ABC's intact. Family at bedside. IV infused NS x1 liter. Pt tolerated well. Pt reported continues to have pain 9/10 at chest and bil legs.

## 2014-03-18 NOTE — ED Notes (Signed)
Pt very upset.  Spoke with Press photographercharge nurse.  Pt has her 995-30 year old daughter with her.  No other person in room.  Staff concerned for narcotic sedation or altered mental status in caring for child.  Informed pt that child will need to go home or another adult sit with child during pain medication treatment.  Pt upset stating that this has not been required in past.  Patty, RN apologized that this hasn't been addressed before but for the safety of the child, another must be here.  Pt agreed to IVF and labs while trying to get ride.

## 2014-03-18 NOTE — ED Notes (Signed)
Pt c/o chest pain that started last night with dizziness, lightheadedness, nausea and that radiates to left arm that started last night. Pt also c/o bilat leg pain. Pt states that pain is little different than her sickle cell pain.

## 2014-03-18 NOTE — ED Provider Notes (Signed)
CSN: [id number]     Arrival date & time 03/18/14  1710 History   First MD Initiated Contact with Patient 03/18/14 1728     Chief Complaint  Patient presents with  . Chest Pain  . Leg Pain     (Consider location/radiation/quality/duration/timing/severity/associated sxs/prior Treatment) The history is provided by the patient and medical records.     Patient with hx sickle cell disease p/w anterior chest pressure and sharp pain with radiation into her left arm.  Associated SOB and feels as if she cannot take a deep breath.  The pain began 4 days ago but worsened overnight.  The SOB began last night.  She also has low back pain and bilateral leg pain that is similar to her chronic pain with sickle cell crises.  She was admitted to the hospital last month for chest pain and similar symptoms but states this is different because she is now short of breath.  Denies hx blood clot, no exogenous estrogen or recent immobilization (with exception of recent hospitalization).  Denies fevers, cough, N/V/D.  Denies known reason for this crisis.    PCP Dr Marthann SchillerMichelle [name]  Past Medical History  Diagnosis Date  . Sickle cell anemia    Past Surgical History  Procedure Laterality Date  . Tubal ligation    . Cholecystectomy    . Cesarean section     Family History  Problem Relation Age of Onset  . Sickle cell anemia Other   . Sickle cell trait Father   . Sickle cell trait Mother    History  Substance Use Topics  . Smoking status: Current Some Day Smoker  . Smokeless tobacco: Not on file     Comment: smokes once every couple of weeks.   . Alcohol Use: No   OB History    No data available     Review of Systems  All other systems reviewed and are negative.     Allergies  Ultram and Morphine and related  Home Medications   Prior to Admission medications   Medication Sig Start Date End Date Taking? Authorizing Provider  folic acid (FOLVITE) 1 MG tablet Take 1 tablet (1 mg total) by  mouth daily. Patient not taking: Reported on 02/16/2014 02/16/14   Massie MaroonLachina M [name], FNP  gabapentin (NEURONTIN) 300 MG capsule Take 1 capsule (300 mg total) by mouth 3 (three) times daily. 02/14/14   Altha HarmMichelle A [name], MD  hydroxyurea (HYDREA) 500 MG capsule Take 1 capsule (500 mg total) by mouth daily. May take with food to minimize GI side effects. 02/14/14   Altha HarmMichelle A [name], MD  Naproxen Sodium (ALEVE) 220 MG CAPS Take 660 mg by mouth every 6 (six) hours as needed (pain).    Historical Provider, MD  oxyCODONE-acetaminophen (PERCOCET) 10-325 MG per tablet Take 1 tablet by mouth every 6 (six) hours as needed for pain. 03/09/14   Massie MaroonLachina M [name], FNP  senna-docusate (SENOKOT-S) 8.6-50 MG per tablet Take 1 tablet by mouth 2 (two) times daily. 02/14/14   Altha HarmMichelle A [name], MD  Topiramate ER 100 MG CP24 Take 100 mg by mouth at bedtime.    Historical Provider, MD   BP 110/77 mmHg  Pulse 79  Temp(Src) 98.7 F (37.1 C) (Oral)  Resp 18  SpO2 96%  LMP 03/04/2014 Physical Exam  Constitutional: She appears well-developed and well-nourished. No distress.  HENT:  Head: Normocephalic and atraumatic.  Neck: Normal range of motion. Neck supple.  Cardiovascular: Normal rate and regular rhythm.  Pulmonary/Chest: Effort normal and breath sounds normal. No respiratory distress. She has no wheezes. She has no rales.  Abdominal: Soft. She exhibits no distension. There is no tenderness. There is no rebound and no guarding.  Musculoskeletal: She exhibits no edema or tenderness.  Neurological: She is alert.  Skin: She is not diaphoretic.  Nursing note and vitals reviewed.   ED Course  Procedures (including critical care time) Labs Review Labs Reviewed  BASIC METABOLIC PANEL - Abnormal; Notable for the following:    Potassium 2.8 (*)    BUN <5 (*)    Creatinine, Ser 0.39 (*)    All other components within normal limits  HEPATIC FUNCTION PANEL - Abnormal; Notable for the following:    AST 45  (*)    Total Bilirubin 2.3 (*)    Bilirubin, Direct 0.4 (*)    Indirect Bilirubin 1.9 (*)    All other components within normal limits  RETICULOCYTES - Abnormal; Notable for the following:    Retic Ct Pct 16.6 (*)    RBC. 2.62 (*)    Retic Count, Manual 434.9 (*)    All other components within normal limits  CBC WITH DIFFERENTIAL - Abnormal; Notable for the following:    WBC 12.3 (*)    RBC 2.62 (*)    Hemoglobin 9.2 (*)    HCT 26.8 (*)    MCV 102.3 (*)    MCH 35.1 (*)    RDW 17.6 (*)    Lymphs Abs 4.5 (*)    Monocytes Absolute 1.1 (*)    All other components within normal limits  URINALYSIS, ROUTINE W REFLEX MICROSCOPIC - Abnormal; Notable for the following:    Color, Urine AMBER (*)    APPearance CLOUDY (*)    All other components within normal limits  BRAIN NATRIURETIC PEPTIDE  TROPONIN I  I-STAT TROPOININ, ED  POC URINE PREG, ED    Imaging Review Dg Chest Port 1 View  03/18/2014   CLINICAL DATA:  Chest pain. Weakness. Sickle cell disease. Shortness of breath.  EXAM: PORTABLE CHEST - 1 VIEW  COMPARISON:  02/09/2014 CT scan  FINDINGS: Upper normal heart size. Indistinct pulmonary vasculature. Right-sided Port-A-Cath tip: Right atrium. Catheter fragment is present in the brachiocephalic vein, a chronic finding.  No airspace opacity identified.  IMPRESSION: 1. Mildly prominent upper zone pulmonary vasculature may be incidental given the semi-erect nature of today's exam, but could reflect pulmonary venous hypertension. Upper normal heart size. 2. Stable chronic catheter fragment in the brachia cephalic vein. 3. No airspace opacity observed.   Electronically Signed   By: Herbie Baltimore M.D.   On: 03/18/2014 17:45     EKG Interpretation None       Patient has a small child in the room with her and nursing is uncomfortable giving patient sedating medications until patient is able to get alternate caregiver in the room to help take care of the child.  I agree with this and pt  has been advised by Gillis Ends, charge nurse.    9:32 PM Discussed pt and CT findings with Dr Jeraldine Loots who recommends observation overnight.  10:00 PM Admitted patient to Dr Alvester Morin.  He requests call to cardiology.    10:50 PM Cardiology fellow to see patient to consult.    MDM   Final diagnoses:  Pain  Chest pain  Dyspnea  Sickle cell pain crisis  Pericardial effusion  Pleural effusion    Afebrile nontoxic patient with sickle cell disease with new SOB,  chest pain.  Found to have new pericardial effusion and trace pleural effusion.  Pain not improved.  Labs c/w sickle cell crisis, approximately at her baseline.  Admitted to [facility], cardiology to consult.    Trixie Dredge, PA-C 03/19/14 0102  Gerhard Munch, MD 03/19/14 [phone]

## 2014-03-18 NOTE — H&P (Signed)
Hospitalist Admission History and Physical  Patient name: Brittany Foster Medical record number: [id number] Date of birth: 07-27-1985 Age: 29 y.o. Gender: female  Primary Care Provider: [name],[name], MD  Chief Complaint: sickle cell pain, pericardial effusion  History of Present Illness: This is a 29 y.o. year old female with significant past medical history of sickle cell disease, medical noncompliance  presenting with sickle cell pain, pericardial effusion. Pt reports pain over past 3-4 days. Has been noncompliant w/ hydroxyurea. Reports diffuse pain which is her usual distribution of pain including chest pain. However, has also developed central chest pain and SOB. No fevers or chills. Nonsmoker. Denies any recent overexertion. States that she ran out of her hydroxyurea 1-2 weeks ago. States that she rarely takes it because it causes flares at times.  Presents to ER T 98.7, HR 70s-90s, resp 10s, BP 110s, satting 100% on 2L [place]. WBC 12.3, hgb 9.2, K 2.8, Cr 0.39. Retic count 16. Trop neg x1. BNP 70s. EKG NSR. CT Angio obtained that was negative for PE but did show small to moderate pericardial effusion inferiorly, trace right pleural effusion, mild periportal edema. Has received 6 mg IV dilaudid in ER w/ minimal to mild improvement in sxs.    Assessment and Plan: Brittany Foster is a 29 y.o. year old female presenting with sickle cell pain, pericardial effusion    Active Problems:   Sickle cell pain crisis  1- Sickle Cell Pain  -IV dilaudid via PCA  -hgb 9 today w/ baseline 7-8-defer transfusion -retic count appropriate -supplemental O2  -no infiltrate on imaging concerning for acute chest- defer abx for now  -noted pericardial effusion on imaging-will need to be addressed by cardiology -defer NSAIDs given pericardial effusion   2- Pericardial Effusion  -noted on CT imaging -no resp distress/increased WOB -hemodynamically stable w/o overt signs of cardiac tamponade  -VSS -no  friction rub on exam  -trop neg x1 -BNP WNL  -suspect there is a symptomatic component of this as pt reports DOE and chest heaviness which are new sxs in setting of sickle cell pain crisis  -I have asked ED PA Irving Burton to formally consult cardiology  -2D ECHO -follow up cardiology recommendations   3- Leukocytosis  -chronic  -stable  -follow  4-Neuropathy -cont home regimen     FEN/GI: heart healthy diet. Replete K. Mag level  Prophylaxis: sub q heparin  Disposition: pending further evaluation  Code Status:Full Code    Patient Active Problem List   Diagnosis Date Noted  . Anemia 02/09/2014  . Hypokalemia 02/09/2014  . Neuropathy 02/09/2014  . Chronic headache disorder 02/09/2014  . Leukocytosis 01/29/2014  . Pain in the chest   . Abnormal CT scan, chest   . Sickle cell anemia with pain 12/30/2013  . Chest pain 12/30/2013  . Sickle cell pain crisis 12/30/2013   Past Medical History: Past Medical History  Diagnosis Date  . Sickle cell anemia     Past Surgical History: Past Surgical History  Procedure Laterality Date  . Tubal ligation    . Cholecystectomy    . Cesarean section      Social History: History   Social History  . Marital Status: Single    Spouse Name: N/A    Number of Children: N/A  . Years of Education: N/A   Social History Main Topics  . Smoking status: Current Some Day Smoker  . Smokeless tobacco: None     Comment: smokes once every couple of weeks.   Marland Kitchen  Alcohol Use: No  . Drug Use: No  . Sexual Activity: None   Other Topics Concern  . None   Social History Narrative    Family History: Family History  Problem Relation Age of Onset  . Sickle cell anemia Other   . Sickle cell trait Father   . Sickle cell trait Mother     Allergies: Allergies  Allergen Reactions  . Ultram [Tramadol] Other (See Comments)    seizures  . Morphine And Related Hives, Rash and Other (See Comments)    shaking    Current Facility-Administered  Medications  Medication Dose Route Frequency Provider Last Rate Last Dose  . 0.9 % NaCl with KCl 20 mEq/ L  infusion   Intravenous Continuous Doree Albee, MD      . heparin injection 5,000 Units  5,000 Units Subcutaneous 3 times per day Doree Albee, MD      . potassium chloride 10 mEq in 100 mL IVPB  10 mEq Intravenous Q1 Hr x 4 Doree Albee, MD      . sodium chloride 0.9 % injection 3 mL  3 mL Intravenous Q12H Doree Albee, MD       Current Outpatient Prescriptions  Medication Sig Dispense Refill  . folic acid (FOLVITE) 1 MG tablet Take 1 tablet (1 mg total) by mouth daily. 30 tablet 11  . gabapentin (NEURONTIN) 300 MG capsule Take 1 capsule (300 mg total) by mouth 3 (three) times daily. 90 capsule 2  . hydroxyurea (HYDREA) 500 MG capsule Take 1 capsule (500 mg total) by mouth daily. May take with food to minimize GI side effects. 30 capsule 0  . Naproxen Sodium (ALEVE) 220 MG CAPS Take 660 mg by mouth every 6 (six) hours as needed (pain).    Marland Kitchen oxyCODONE-acetaminophen (PERCOCET) 10-325 MG per tablet Take 1 tablet by mouth every 6 (six) hours as needed for pain. 60 tablet 0  . Topiramate ER 100 MG CP24 Take 100 mg by mouth at bedtime.    . senna-docusate (SENOKOT-S) 8.6-50 MG per tablet Take 1 tablet by mouth 2 (two) times daily. (Patient not taking: Reported on 03/18/2014)     Review Of Systems: 12 point ROS negative except as noted above in HPI.  Physical Exam: Filed Vitals:   03/18/14 2032  BP: 111/66  Pulse: 96  Temp:   Resp: 18    General: alert and cooperative HEENT: PERRLA and extra ocular movement intact Heart: S1, S2 normal, no murmur, rub or gallop, regular rate and rhythm, no edema or JVD Lungs: clear to auscultation, no wheezes or rales and unlabored breathing Abdomen: abdomen is soft without significant tenderness, masses, organomegaly or guarding Extremities: extremities normal, atraumatic, no cyanosis or edema Skin:no rashes Neurology: normal without focal  findings  Labs and Imaging: Lab Results  Component Value Date/Time   NA 139 03/18/2014 06:11 PM   K 2.8* 03/18/2014 06:11 PM   CL 108 03/18/2014 06:11 PM   CO2 23 03/18/2014 06:11 PM   BUN <5* 03/18/2014 06:11 PM   CREATININE 0.39* 03/18/2014 06:11 PM   GLUCOSE 86 03/18/2014 06:11 PM   Lab Results  Component Value Date   WBC 12.3* 03/18/2014   HGB 9.2* 03/18/2014   HCT 26.8* 03/18/2014   MCV 102.3* 03/18/2014   PLT 307 03/18/2014    Ct Angio Chest Pe W/cm &/or Wo Cm  03/18/2014   CLINICAL DATA:  Chest pain. Shortness of breath. Sickle cell disease.  EXAM: CT ANGIOGRAPHY CHEST WITH CONTRAST  TECHNIQUE:  Multidetector CT imaging of the chest was performed using the standard protocol during bolus administration of intravenous contrast. Multiplanar CT image reconstructions and MIPs were obtained to evaluate the vascular anatomy.  CONTRAST:  100mL OMNIPAQUE IOHEXOL 350 MG/ML SOLN  COMPARISON:  Multiple exams, including 02/09/2014 and 03/18/2014  FINDINGS: No filling defect is identified in the pulmonary arterial tree to suggest pulmonary embolus. No acute aortic findings. Stably prominent thymus. Pericardial effusion inferiorly.  Small type 1 hiatal hernia.  Trace right pleural effusion.  No pathologic thoracic adenopathy. Subsegmental atelectasis anteriorly in the right lower lobe.  4.1 cm catheter fragment is stuck in The brachiocephalic vein.  Mild periportal edema in the upper abdomen. Small hyperdense spleen. Sclerosis in the sternum and in portions of the sixth and eighth thoracic vertebra are likely a manifestation of sickle cell disease.  Review of the MIP images confirms the above findings.  IMPRESSION: 1. No pulmonary embolus or acute aortic findings. 2. Small to moderate pericardial effusion inferiorly. This could be further assessed with echocardiography if clinically warranted, particularly if the patient has jugular venous distension or other signs of tamponade. 3. Trace right pleural  effusion. Subsegmental atelectasis anterolaterally in the right lower lobe. 4. Small type 1 hiatal hernia. 5. Mild periportal edema. 6. Manifestations of sickle cell disease include a small hyperdense spleen, thymic hyperplasia, and osseous manifestations.   Electronically Signed   By: Herbie BaltimoreWalt  Liebkemann M.D.   On: 03/18/2014 20:22   Dg Chest Port 1 View  03/18/2014   CLINICAL DATA:  Chest pain. Weakness. Sickle cell disease. Shortness of breath.  EXAM: PORTABLE CHEST - 1 VIEW  COMPARISON:  02/09/2014 CT scan  FINDINGS: Upper normal heart size. Indistinct pulmonary vasculature. Right-sided Port-A-Cath tip: Right atrium. Catheter fragment is present in the brachiocephalic vein, a chronic finding.  No airspace opacity identified.  IMPRESSION: 1. Mildly prominent upper zone pulmonary vasculature may be incidental given the semi-erect nature of today's exam, but could reflect pulmonary venous hypertension. Upper normal heart size. 2. Stable chronic catheter fragment in the brachia cephalic vein. 3. No airspace opacity observed.   Electronically Signed   By: Herbie BaltimoreWalt  Liebkemann M.D.   On: 03/18/2014 17:45           Doree AlbeeSteven Japleen Tornow MD  Pager: [phone]

## 2014-03-19 ENCOUNTER — Inpatient Hospital Stay (HOSPITAL_COMMUNITY): Admission: RE | Admit: 2014-03-19 | Payer: Medicaid Other | Source: Ambulatory Visit

## 2014-03-19 DIAGNOSIS — I369 Nonrheumatic tricuspid valve disorder, unspecified: Secondary | ICD-10-CM

## 2014-03-19 DIAGNOSIS — D5700 Hb-SS disease with crisis, unspecified: Principal | ICD-10-CM

## 2014-03-19 DIAGNOSIS — R072 Precordial pain: Secondary | ICD-10-CM

## 2014-03-19 DIAGNOSIS — I313 Pericardial effusion (noninflammatory): Secondary | ICD-10-CM | POA: Insufficient documentation

## 2014-03-19 DIAGNOSIS — I3139 Other pericardial effusion (noninflammatory): Secondary | ICD-10-CM | POA: Insufficient documentation

## 2014-03-19 DIAGNOSIS — I319 Disease of pericardium, unspecified: Secondary | ICD-10-CM

## 2014-03-19 LAB — CBC WITH DIFFERENTIAL/PLATELET
Basophils Absolute: 0 10*3/uL (ref 0.0–0.1)
Basophils Relative: 0 % (ref 0–1)
EOS PCT: 1 % (ref 0–5)
Eosinophils Absolute: 0.1 10*3/uL (ref 0.0–0.7)
HEMATOCRIT: 22.7 % — AB (ref 36.0–46.0)
Hemoglobin: 7.7 g/dL — ABNORMAL LOW (ref 12.0–15.0)
Lymphocytes Relative: 39 % (ref 12–46)
Lymphs Abs: 4.8 10*3/uL — ABNORMAL HIGH (ref 0.7–4.0)
MCH: 35.3 pg — ABNORMAL HIGH (ref 26.0–34.0)
MCHC: 33.9 g/dL (ref 30.0–36.0)
MCV: 104.1 fL — AB (ref 78.0–100.0)
Monocytes Absolute: 1.3 10*3/uL — ABNORMAL HIGH (ref 0.1–1.0)
Monocytes Relative: 10 % (ref 3–12)
Neutro Abs: 6.1 10*3/uL (ref 1.7–7.7)
Neutrophils Relative %: 50 % (ref 43–77)
Platelets: 263 10*3/uL (ref 150–400)
RBC: 2.18 MIL/uL — ABNORMAL LOW (ref 3.87–5.11)
RDW: 17.7 % — ABNORMAL HIGH (ref 11.5–15.5)
WBC: 12.4 10*3/uL — ABNORMAL HIGH (ref 4.0–10.5)

## 2014-03-19 LAB — RETICULOCYTES
RBC.: 2.18 MIL/uL — ABNORMAL LOW (ref 3.87–5.11)
Retic Count, Absolute: 361.9 10*3/uL — ABNORMAL HIGH (ref 19.0–186.0)
Retic Ct Pct: 16.6 % — ABNORMAL HIGH (ref 0.4–3.1)

## 2014-03-19 LAB — COMPREHENSIVE METABOLIC PANEL
ALT: 22 U/L (ref 0–35)
AST: 38 U/L — AB (ref 0–37)
Albumin: 3.4 g/dL — ABNORMAL LOW (ref 3.5–5.2)
Alkaline Phosphatase: 82 U/L (ref 39–117)
Anion gap: 5 (ref 5–15)
BILIRUBIN TOTAL: 1.8 mg/dL — AB (ref 0.3–1.2)
BUN: <5 mg/dL — ABNORMAL LOW (ref 6–23)
CO2: 24 mmol/L (ref 19–32)
CREATININE: 0.38 mg/dL — AB (ref 0.50–1.10)
Calcium: 8.4 mg/dL (ref 8.4–10.5)
Chloride: 112 mEq/L (ref 96–112)
GFR calc Af Amer: >90 mL/min (ref >90)
GFR calc non Af Amer: >90 mL/min (ref >90)
Glucose, Bld: 107 mg/dL — ABNORMAL HIGH (ref 70–99)
Potassium: 4 mmol/L (ref 3.5–5.1)
SODIUM: 141 mmol/L (ref 135–145)
TOTAL PROTEIN: 6.2 g/dL (ref 6.0–8.3)

## 2014-03-19 LAB — TROPONIN I: Troponin I: <0.03 ng/mL (ref <0.031)

## 2014-03-19 LAB — MAGNESIUM: Magnesium: 1.9 mg/dL (ref 1.5–2.5)

## 2014-03-19 MED ORDER — SODIUM CHLORIDE 0.9 % IJ SOLN
10.0000 mL | INTRAMUSCULAR | Status: DC | PRN
  Administered 2014-03-19 – 2014-03-23 (×4): 10 mL
  Filled 2014-03-19 (×3): qty 40

## 2014-03-19 MED ORDER — DIPHENHYDRAMINE HCL 12.5 MG/5ML PO ELIX: 12.5000 mg | ORAL_SOLUTION | Freq: Four times a day (QID) | ORAL | Status: DC | PRN

## 2014-03-19 MED ORDER — ONDANSETRON HCL 4 MG/2ML IJ SOLN: 4.0000 mg | Freq: Four times a day (QID) | INTRAMUSCULAR | Status: DC | PRN

## 2014-03-19 MED ORDER — SODIUM CHLORIDE 0.9 % IV SOLN
12.5000 mg | Freq: Four times a day (QID) | INTRAVENOUS | Status: DC | PRN
  Filled 2014-03-19: qty 0.25

## 2014-03-19 MED ORDER — NALOXONE HCL 0.4 MG/ML IJ SOLN: 0.4000 mg | INTRAMUSCULAR | Status: DC | PRN

## 2014-03-19 MED ORDER — SODIUM CHLORIDE 0.9 % IJ SOLN: 9.0000 mL | INTRAMUSCULAR | Status: DC | PRN

## 2014-03-19 MED ORDER — FOLIC ACID 1 MG PO TABS
1.0000 mg | ORAL_TABLET | Freq: Every day | ORAL | Status: DC
  Administered 2014-03-19 – 2014-03-27 (×9): 1 mg via ORAL
  Filled 2014-03-19 (×10): qty 1

## 2014-03-19 MED ORDER — KETOROLAC TROMETHAMINE 15 MG/ML IJ SOLN
15.0000 mg | Freq: Four times a day (QID) | INTRAMUSCULAR | Status: DC
  Administered 2014-03-19 – 2014-03-20 (×5): 15 mg via INTRAVENOUS
  Filled 2014-03-19 (×8): qty 1

## 2014-03-19 MED ORDER — HYDROMORPHONE HCL 1 MG/ML IJ SOLN
1.0000 mg | Freq: Once | INTRAMUSCULAR | Status: AC
  Administered 2014-03-19: 1 mg via INTRAVENOUS
  Filled 2014-03-19: qty 1

## 2014-03-19 MED ORDER — DEXTROSE-NACL 5-0.45 % IV SOLN
INTRAVENOUS | Status: DC
  Administered 2014-03-19 – 2014-03-22 (×5): via INTRAVENOUS
  Administered 2014-03-23: 75 mL/h via INTRAVENOUS
  Administered 2014-03-24 – 2014-03-27 (×8): via INTRAVENOUS

## 2014-03-19 MED ORDER — SENNOSIDES-DOCUSATE SODIUM 8.6-50 MG PO TABS
1.0000 | ORAL_TABLET | Freq: Two times a day (BID) | ORAL | Status: DC
  Administered 2014-03-22 – 2014-03-27 (×4): 1 via ORAL
  Filled 2014-03-19 (×12): qty 1

## 2014-03-19 MED ORDER — HYDROXYUREA 500 MG PO CAPS
500.0000 mg | ORAL_CAPSULE | Freq: Every day | ORAL | Status: DC
  Administered 2014-03-19 – 2014-03-25 (×7): 500 mg via ORAL
  Filled 2014-03-19 (×7): qty 1

## 2014-03-19 MED ORDER — HYDROMORPHONE 2 MG/ML HIGH CONCENTRATION IV PCA SOLN
INTRAVENOUS | Status: DC
  Administered 2014-03-19: 6.5 mg via INTRAVENOUS
  Administered 2014-03-19: 10:00:00 via INTRAVENOUS
  Administered 2014-03-19: 7.2 mg via INTRAVENOUS
  Administered 2014-03-19: 11 mg via INTRAVENOUS
  Administered 2014-03-20: 3 mg via INTRAVENOUS
  Administered 2014-03-20: 7.5 mg via INTRAVENOUS
  Administered 2014-03-20: 4.5 mg via INTRAVENOUS
  Administered 2014-03-20: 4 mg via INTRAVENOUS
  Administered 2014-03-20: 5 mg via INTRAVENOUS
  Administered 2014-03-20: 18:00:00 via INTRAVENOUS
  Administered 2014-03-21: 5.5 mg via INTRAVENOUS
  Administered 2014-03-21: 5 mg via INTRAVENOUS
  Administered 2014-03-21: 4 mg via INTRAVENOUS
  Administered 2014-03-21: 6 mg via INTRAVENOUS
  Administered 2014-03-21: 8.5 mg via INTRAVENOUS
  Filled 2014-03-19 (×2): qty 25

## 2014-03-19 NOTE — Progress Notes (Signed)
Patient ID: Brittany Foster, female   DOB: 1986-01-14, 29 y.o.   MRN: [id number] SICKLE CELL SERVICE PROGRESS NOTE  Brittany Foster WUJ:[id number] DOB: 1985/04/08 DOA: 03/18/2014 PCP: [name],[name], MD   Presenting HPI: This is a 29 y.o. year old female with significant past medical history of sickle cell disease, medical noncompliance presenting with sickle cell pain, pericardial effusion. Pt reports pain over past 3-4 days. Has been noncompliant w/ hydroxyurea. Reports diffuse pain which is her usual distribution of pain including chest pain. However, has also developed central chest pain and SOB. No fevers or chills. Nonsmoker. Denies any recent overexertion. States that she ran out of her hydroxyurea 1-2 weeks ago. States that she rarely takes it because it causes flares at times.  Presents to ER T 98.7, HR 70s-90s, resp 10s, BP 110s, satting 100% on 2L [place]. WBC 12.3, hgb 9.2, K 2.8, Cr 0.39. Retic count 16. Trop neg x1. BNP 70s. EKG NSR. CT Angio obtained that was negative for PE but did show small to moderate pericardial effusion inferiorly, trace right pleural effusion, mild periportal edema. Has received 6 mg IV dilaudid in ER w/ minimal to mild improvement in sxs.   Consultants:  None  Procedures:  none  Antibiotics:  none  HPI/Subjective: Pt states that she is having pain in her chest, back, and legs. Rates pain as a 12/10. She also feels SOB, as if something is pressing on her chest and she is unable to catch a full breath. She feels that her PCA isn't working.  Objective: Filed Vitals:   03/19/14 0245 03/19/14 0433 03/19/14 0757 03/19/14 1027  BP: 85/56 89/53    Pulse: 64 60    Temp: 98.3 F (36.8 C)     TempSrc: Oral     Resp: Height:      Weight:      SpO2: 100% 100% 99% 100%   Weight change:   Intake/Output Summary (Last 24 hours) at 03/19/14 1043 Last data filed at 03/19/14 0700  Gross per 24 hour  Intake  897.5 ml  Output      0 ml  Net  897.5  ml    General: Alert, awake, oriented x3, in no acute distress.  HEENT: [place]/AT PEERL, EOMI Neck: Trachea midline,  no masses, no thyromegal,y no JVD. OROPHARYNX:  Moist, No exudate/ erythema/lesions.  Heart: Regular rate and rhythm, without murmurs, rubs, gallops Lungs: Clear to auscultation, no wheezing or rhonchi noted. Abdomen: Soft, nontender, nondistended, positive bowel sounds, no masses no hepatosplenomegaly noted. Neuro: No focal neurological deficits noted cranial nerves II through XII grossly intact. Strength 5 out of 5 in bilateral upper and lower extremities. Musculoskeletal: No warm swelling or erythema around joints, no spinal tenderness noted.   Data Reviewed: Basic Metabolic Panel:  Recent Labs Lab 03/18/14 1811 03/19/14 0414  NA 139 141  K 2.8* 4.0  CL 108 112  CO2 23 24  GLUCOSE 86 107*  BUN <5* <5*  CREATININE 0.39* 0.38*  CALCIUM 8.8 8.4  MG 1.9  --    Liver Function Tests:  Recent Labs Lab 03/18/14 1813 03/19/14 0414  AST 45* 38*  ALT 28 22  ALKPHOS 94 82  BILITOT 2.3* 1.8*  PROT 7.6 6.2  ALBUMIN 4.3 3.4*   CBC:  Recent Labs Lab 03/18/14 1813 03/19/14 0414  WBC 12.3* 12.4*  NEUTROABS 6.6 6.1  HGB 9.2* 7.7*  HCT 26.8* 22.7*  MCV 102.3* 104.1*  PLT 307 263  Cardiac Enzymes:  Recent Labs Lab 03/18/14 1912 03/19/14 0414  TROPONINI <0.03 <0.03   BNP (last 3 results) No results for input(s): PROBNP in the last 8760 hours. CBG: No results for input(s): GLUCAP in the last 168 hours.  No results found for this or any previous visit (from the past 240 hour(s)).   Studies: Ct Angio Chest Pe W/cm &/or Wo Cm  03/18/2014   CLINICAL DATA:  Chest pain. Shortness of breath. Sickle cell disease.  EXAM: CT ANGIOGRAPHY CHEST WITH CONTRAST  TECHNIQUE: Multidetector CT imaging of the chest was performed using the standard protocol during bolus administration of intravenous contrast. Multiplanar CT image reconstructions and MIPs were obtained to  evaluate the vascular anatomy.  CONTRAST:  OMNIPAQUE IOHEXOL 350 MG/ML SOLN  COMPARISON:  Multiple exams, including 02/09/2014 and 03/18/2014  FINDINGS: No filling defect is identified in the pulmonary arterial tree to suggest pulmonary embolus. No acute aortic findings. Stably prominent thymus. Pericardial effusion inferiorly.  Small type 1 hiatal hernia.  Trace right pleural effusion.  No pathologic thoracic adenopathy. Subsegmental atelectasis anteriorly in the right lower lobe.  4.1 cm catheter fragment is stuck in The brachiocephalic vein.  Mild periportal edema in the upper abdomen. Small hyperdense spleen. Sclerosis in the sternum and in portions of the sixth and eighth thoracic vertebra are likely a manifestation of sickle cell disease.  Review of the MIP images confirms the above findings.  IMPRESSION: 1. No pulmonary embolus or acute aortic findings. 2. Small to moderate pericardial effusion inferiorly. This could be further assessed with echocardiography if clinically warranted, particularly if the patient has jugular venous distension or other signs of tamponade. 3. Trace right pleural effusion. Subsegmental atelectasis anterolaterally in the right lower lobe. 4. Small type 1 hiatal hernia. 5. Mild periportal edema. 6. Manifestations of sickle cell disease include a small hyperdense spleen, thymic hyperplasia, and osseous manifestations.   Electronically Signed   By: Herbie Baltimore M.D.   On: 03/18/2014 20:22   Dg Chest Port 1 View  03/18/2014   CLINICAL DATA:  Chest pain. Weakness. Sickle cell disease. Shortness of breath.  EXAM: PORTABLE CHEST - 1 VIEW  COMPARISON:  02/09/2014 CT scan  FINDINGS: Upper normal heart size. Indistinct pulmonary vasculature. Right-sided Port-A-Cath tip: Right atrium. Catheter fragment is present in the brachiocephalic vein, a chronic finding.  No airspace opacity identified.  IMPRESSION: 1. Mildly prominent upper zone pulmonary vasculature may be incidental given  the semi-erect nature of today's exam, but could reflect pulmonary venous hypertension. Upper normal heart size. 2. Stable chronic catheter fragment in the brachia cephalic vein. 3. No airspace opacity observed.   Electronically Signed   By: Herbie Baltimore M.D.   On: 03/18/2014 17:45    Scheduled Meds: . gabapentin  300 mg Oral TID  . heparin  5,000 Units Subcutaneous 3 times per day  . HYDROmorphone PCA 2 mg/mL   Intravenous 6 times per day  . sodium chloride  3 mL Intravenous Q12H  . topiramate  50 mg Oral BID   Continuous Infusions: . 0.9 % NaCl with KCl 20 mEq / L 75 mL/hr at 03/19/14 0022    Active Problems:   Sickle cell pain crisis   Assessment/Plan: Active Problems:   Sickle cell pain crisis 1)Sickle Cell Crisis: Patient presented with pain characteristic of acute vaso-occlusive crisis. Continue Dilaudid PCAbut will change lockout from 1.25mg  to  per hour so she is able to get every available dose. Start Ketorolac. Continue gabapentin for chronic  pain.  2)Pericardial effusion: Small to moderate in size based off CT. Cardiology was consulted and contributed effusion to sickle crisis. Pending TTE and ANA. Cardiology does not think further intervention is needed and she should be treated for crisis, however waiting for TTE to confirm.  3)Sickle Cell care: Continue Hydrea and Folic acid.  4)Migraines: Contiue topiramate for prophylaxis   FEN/GI : Regular Diet   IV fluids-switch to D5/0.45% Bowel regimen in place  Code Status: full DVT Prophylaxis: heparin Family Communication: none Disposition Plan: pending improvement  Serina CowperAGBOOLA, Josee Speece  Pager [phone]. If 7PM-7AM, please contact night-coverage.  03/19/2014, 10:43 AM  LOS: 1 day   Serina CowperAGBOOLA, Giara Mcgaughey

## 2014-03-19 NOTE — Progress Notes (Signed)
  Echocardiogram 2D Echocardiogram has been performed.  Janalyn HarderWest, Talley Casco R 03/19/2014, 4:09 PM

## 2014-03-19 NOTE — Care Management Note (Addendum)
    Page 1 of 1   03/22/2014     3:16:56 PM CARE MANAGEMENT NOTE 03/22/2014  Patient:  Brittany Foster,Brittany Foster   Account Number:  [id number]  Date Initiated:  03/19/2014  Documentation initiated by:  Lanier ClamMAHABIR,Marshayla Mitschke  Subjective/Objective Assessment:   29 y/o f admitted w/ssc.     Action/Plan:   From home.   Anticipated DC Date:  03/24/2014   Anticipated DC Plan:  HOME/SELF CARE      DC Planning Services  CM consult      Choice offered to / List presented to:             Status of service:  In process, will continue to follow Medicare Important Message given?   (If response is "NO", the following Medicare IM given date fields will be blank) Date Medicare IM given:   Medicare IM given by:   Date Additional Medicare IM given:   Additional Medicare IM given by:    Discharge Disposition:    Per UR Regulation:  Reviewed for med. necessity/level of care/duration of stay  If discussed at Long Length of Stay Meetings, dates discussed:    Comments:  03/22/14 Lanier ClamKathy Kwadwo Taras RN BSN NCM [phone] ivf,dilaudid iv pca.No anticipated d/c needs.  03/19/14 Lanier ClamKathy Coltin Casher RN BSN NCM [phone] Monitor progress for d/c needs.No anticipated d/c needs.

## 2014-03-19 NOTE — Consult Note (Cosign Needed)
CARDIOLOGY CONSULT NOTE   Patient ID: Brittany Foster MRN: [id number], DOB/AGE: 30/30/1987   Admit date: 03/18/2014 Date of Consult: 03/19/2014   Primary Physician: Altha HarmMATTHEWS,[name], MD Primary Cardiologist: None  CC: Sickle crisis Reason for consult: Pericardial effusion  Problem List  Past Medical History  Diagnosis Date  . Sickle cell anemia     Past Surgical History  Procedure Laterality Date  . Tubal ligation    . Cholecystectomy    . Cesarean section    . Port a cath placement Right     about 6-7 years ago     Allergies  Allergies  Allergen Reactions  . Ultram [Tramadol] Other (See Comments)    seizures  . Morphine And Related Hives, Rash and Other (See Comments)    shaking    HPI   29F with a longstanding history of sickle cell with frequent crises presented earlier today with chest pain and shortness of breath in addition to her typical sickle crisis. She has had symptoms for the past 3-4 days but reports that this degree of chest pain and shortness of breath is unusual for her. She underwent CT scan of her chest that revealed mild-moderate inferior pericardial effusion.  Currently she complains of persistent chest pain and mild shortness of breath. She has mild difficulty laying flat (2/2 chest pain, not SOB). She was given dilaudid in the ED for her symptoms.   Inpatient Medications  . gabapentin  300 mg Oral TID  . heparin  5,000 Units Subcutaneous 3 times per day  . HYDROmorphone PCA 0.3 mg/mL   Intravenous 6 times per day  . potassium chloride  10 mEq Intravenous Q1 Hr x 4  . sodium chloride  3 mL Intravenous Q12H  . topiramate  50 mg Oral BID    Family History Family History  Problem Relation Age of Onset  . Sickle cell anemia Other   . Sickle cell trait Father   . Sickle cell trait Mother      Social History History   Social History  . Marital Status: Single    Spouse Name: N/A    Number of Children: N/A  . Years of Education: N/A    Occupational History  . Not on file.   Social History Main Topics  . Smoking status: Current Some Day Smoker  . Smokeless tobacco: Not on file     Comment: smokes once every couple of weeks.   . Alcohol Use: No  . Drug Use: No  . Sexual Activity: Not on file   Other Topics Concern  . Not on file   Social History Narrative     Review of Systems  General:  No chills, fever, night sweats or weight changes.  Cardiovascular:  +chest pain, no dyspnea on exertion, edema, orthopnea, palpitations, paroxysmal nocturnal dyspnea. Dermatological: No rash, lesions/masses Respiratory: No cough, +dyspnea Urologic: No hematuria, dysuria Abdominal:   No nausea, vomiting, diarrhea, bright red blood per rectum, melena, or hematemesis Neurologic:  No visual changes, wkns, changes in mental status. All other systems reviewed and are otherwise negative except as noted above.  Physical Exam  Blood pressure 88/52, pulse 62, temperature 98.2 F (36.8 C), temperature source Oral, resp. rate 15, height 5' (1.524 m), weight 115 lb 11.9 oz (52.5 kg), last menstrual period 03/04/2014, SpO2 100 %.  General: NAD Psych: Normal affect. Poor eye contact Neuro: Alert and oriented X 3. Moves all extremities spontaneously. HEENT: Normal  Neck: Supple without bruits or JVD. Lungs:  Resp regular and unlabored, CTA. Heart: RRR no s3, s4, or murmurs. Unable to obtain pulsus because of nonfunctional BP cuff. No rub Abdomen: Soft, non-tender, non-distended, BS + x 4.  Extremities: No clubbing, cyanosis or edema. DP/PT/Radials 2+ and equal bilaterally.  Labs   Recent Labs  03/18/14 1912  TROPONINI <0.03   Lab Results  Component Value Date   WBC 12.3* 03/18/2014   HGB 9.2* 03/18/2014   HCT 26.8* 03/18/2014   MCV 102.3* 03/18/2014   PLT 307 03/18/2014    Recent Labs Lab 03/18/14 1811 03/18/14 1813  NA 139  --   K 2.8*  --   CL 108  --   CO2 23  --   BUN <5*  --   CREATININE 0.39*  --     CALCIUM 8.8  --   PROT  --  7.6  BILITOT  --  2.3*  ALKPHOS  --  94  ALT  --  28  AST  --  45*  GLUCOSE 86  --    No results found for: CHOL, HDL, LDLCALC, TRIG Lab Results  Component Value Date   DDIMER 1.12* 12/30/2013    Radiology/Studies  Ct Angio Chest Pe W/cm &/or Wo Cm  03/18/2014   CLINICAL DATA:  Chest pain. Shortness of breath. Sickle cell disease.  EXAM: CT ANGIOGRAPHY CHEST WITH CONTRAST  TECHNIQUE: Multidetector CT imaging of the chest was performed using the standard protocol during bolus administration of intravenous contrast. Multiplanar CT image reconstructions and MIPs were obtained to evaluate the vascular anatomy.  CONTRAST:  OMNIPAQUE IOHEXOL 350 MG/ML SOLN  COMPARISON:  Multiple exams, including 02/09/2014 and 03/18/2014  FINDINGS: No filling defect is identified in the pulmonary arterial tree to suggest pulmonary embolus. No acute aortic findings. Stably prominent thymus. Pericardial effusion inferiorly.  Small type 1 hiatal hernia.  Trace right pleural effusion.  No pathologic thoracic adenopathy. Subsegmental atelectasis anteriorly in the right lower lobe.  4.1 cm catheter fragment is stuck in The brachiocephalic vein.  Mild periportal edema in the upper abdomen. Small hyperdense spleen. Sclerosis in the sternum and in portions of the sixth and eighth thoracic vertebra are likely a manifestation of sickle cell disease.  Review of the MIP images confirms the above findings.  IMPRESSION: 1. No pulmonary embolus or acute aortic findings. 2. Small to moderate pericardial effusion inferiorly. This could be further assessed with echocardiography if clinically warranted, particularly if the patient has jugular venous distension or other signs of tamponade. 3. Trace right pleural effusion. Subsegmental atelectasis anterolaterally in the right lower lobe. 4. Small type 1 hiatal hernia. 5. Mild periportal edema. 6. Manifestations of sickle cell disease include a small  hyperdense spleen, thymic hyperplasia, and osseous manifestations.   Electronically Signed   By: Herbie Baltimore M.D.   On: 03/18/2014 20:22   Dg Chest Port 1 View  03/18/2014   CLINICAL DATA:  Chest pain. Weakness. Sickle cell disease. Shortness of breath.  EXAM: PORTABLE CHEST - 1 VIEW  COMPARISON:  02/09/2014 CT scan  FINDINGS: Upper normal heart size. Indistinct pulmonary vasculature. Right-sided Port-A-Cath tip: Right atrium. Catheter fragment is present in the brachiocephalic vein, a chronic finding.  No airspace opacity identified.  IMPRESSION: 1. Mildly prominent upper zone pulmonary vasculature may be incidental given the semi-erect nature of today's exam, but could reflect pulmonary venous hypertension. Upper normal heart size. 2. Stable chronic catheter fragment in the brachia cephalic vein. 3. No airspace opacity observed.   Electronically Signed  By: Herbie Baltimore M.D.   On: 03/18/2014 17:45    ECG NSR@83bpm  normal axis, intervals, no acute ST-TW changes  ASSESSMENT AND PLAN 1. Pericardial effusion  28 F with multiple sickle cell crises now presenting with chest pain and shortness of breath, found to have a pericardial effusion on CT. Currently she does not have any clinical signs or symptoms of cardiac tamponade. She is able to lay flat without additional discomfort and she does not have elevated JVP. Unclear etiology for her effusion but likely to be in the setting of her sickle crisis. -please obtain TTE in AM -please obtain ANA to exclude other rheumatologic disease, specifically SLE (though low likelihood)   Signed, Sammuel Hines, MD MPH 03/19/2014, 2:20 AM

## 2014-03-19 NOTE — Progress Notes (Signed)
Pt port unable to get blood return for lab draws.  (IV team paged, pt refused to let IV team give medication to break up clot).  Pt refusing to be stuck for lab draws.  MD made aware.

## 2014-03-19 NOTE — Plan of Care (Signed)
Problem: Phase I Progression Outcomes Goal: Pain controlled with appropriate interventions Outcome: Progressing Pt currently on a PCA pump

## 2014-03-19 NOTE — Progress Notes (Signed)
TELEMETRY: Reviewed telemetry pt in NSR: Filed Vitals:   03/19/14 0040 03/19/14 0140 03/19/14 0245 03/19/14 0433  BP: 94/62 88/52 85/56  89/53  Pulse: 61 62 64 60  Temp: 98.5 F (36.9 C) 98.2 F (36.8 C) 98.3 F (36.8 C)   TempSrc: Oral Oral Oral   Resp: 17 15 18 14   Height:      Weight:      SpO2: 100% 100% 100% 100%    Intake/Output Summary (Last 24 hours) at 03/19/14 0730 Last data filed at 03/19/14 0700  Gross per 24 hour  Intake  897.5 ml  Output      0 ml  Net  897.5 ml   Filed Weights   03/18/14 2320  Weight: 115 lb 11.9 oz (52.5 kg)    Subjective Patient complains of pain in her chest- all over and down her arms. Also complains of pain in her legs. No cough or fever.   . gabapentin  300 mg Oral TID  . heparin  5,000 Units Subcutaneous 3 times per day  . HYDROmorphone PCA 0.3 mg/mL   Intravenous 6 times per day  . sodium chloride  3 mL Intravenous Q12H  . topiramate  50 mg Oral BID   . 0.9 % NaCl with KCl 20 mEq / L 75 mL/hr at 03/19/14 0022    LABS: Basic Metabolic Panel:  Recent Labs  09/81/1901/17/16 1811 03/19/14 0414  NA 139 141  K 2.8* 4.0  CL 108 112  CO2 23 24  GLUCOSE 86 107*  BUN <5* <5*  CREATININE 0.39* 0.38*  CALCIUM 8.8 8.4  MG 1.9  --    Liver Function Tests:  Recent Labs  03/18/14 1813 03/19/14 0414  AST 45* 38*  ALT 28 22  ALKPHOS 94 82  BILITOT 2.3* 1.8*  PROT 7.6 6.2  ALBUMIN 4.3 3.4*   No results for input(s): LIPASE, AMYLASE in the last 72 hours. CBC:  Recent Labs  03/18/14 1813 03/19/14 0414  WBC 12.3* 12.4*  NEUTROABS 6.6 6.1  HGB 9.2* 7.7*  HCT 26.8* 22.7*  MCV 102.3* 104.1*  PLT 307 263   Cardiac Enzymes:  Recent Labs  03/18/14 1912 03/19/14 0414  TROPONINI <0.03 <0.03   BNP: 78.1    Radiology/Studies:  Ct Angio Chest Pe W/cm &/or Wo Cm  03/18/2014   CLINICAL DATA:  Chest pain. Shortness of breath. Sickle cell disease.  EXAM: CT ANGIOGRAPHY CHEST WITH CONTRAST  TECHNIQUE: Multidetector CT  imaging of the chest was performed using the standard protocol during bolus administration of intravenous contrast. Multiplanar CT image reconstructions and MIPs were obtained to evaluate the vascular anatomy.  CONTRAST:  100mL OMNIPAQUE IOHEXOL 350 MG/ML SOLN  COMPARISON:  Multiple exams, including 02/09/2014 and 03/18/2014  FINDINGS: No filling defect is identified in the pulmonary arterial tree to suggest pulmonary embolus. No acute aortic findings. Stably prominent thymus. Pericardial effusion inferiorly.  Small type 1 hiatal hernia.  Trace right pleural effusion.  No pathologic thoracic adenopathy. Subsegmental atelectasis anteriorly in the right lower lobe.  4.1 cm catheter fragment is stuck in The brachiocephalic vein.  Mild periportal edema in the upper abdomen. Small hyperdense spleen. Sclerosis in the sternum and in portions of the sixth and eighth thoracic vertebra are likely a manifestation of sickle cell disease.  Review of the MIP images confirms the above findings.  IMPRESSION: 1. No pulmonary embolus or acute aortic findings. 2. Small to moderate pericardial effusion inferiorly. This could be further assessed with echocardiography if  clinically warranted, particularly if the patient has jugular venous distension or other signs of tamponade. 3. Trace right pleural effusion. Subsegmental atelectasis anterolaterally in the right lower lobe. 4. Small type 1 hiatal hernia. 5. Mild periportal edema. 6. Manifestations of sickle cell disease include a small hyperdense spleen, thymic hyperplasia, and osseous manifestations.   Electronically Signed   By: Herbie Baltimore M.D.   On: 03/18/2014 20:22   Dg Chest Port 1 View  03/18/2014   CLINICAL DATA:  Chest pain. Weakness. Sickle cell disease. Shortness of breath.  EXAM: PORTABLE CHEST - 1 VIEW  COMPARISON:  02/09/2014 CT scan  FINDINGS: Upper normal heart size. Indistinct pulmonary vasculature. Right-sided Port-A-Cath tip: Right atrium. Catheter fragment is  present in the brachiocephalic vein, a chronic finding.  No airspace opacity identified.  IMPRESSION: 1. Mildly prominent upper zone pulmonary vasculature may be incidental given the semi-erect nature of today's exam, but could reflect pulmonary venous hypertension. Upper normal heart size. 2. Stable chronic catheter fragment in the brachia cephalic vein. 3. No airspace opacity observed.   Electronically Signed   By: Herbie Baltimore M.D.   On: 03/18/2014 17:45   Ecg is normal.   PHYSICAL EXAM General: Well developed, well nourished, young female in no acute distress. Head: Normocephalic, atraumatic, sclera non-icteric, oropharynx is clear Neck: Negative for carotid bruits. JVD not elevated. No adenopathy Lungs: Clear bilaterally to auscultation without wheezes, rales, or rhonchi. Breathing is unlabored. Heart: RRR S1 S2 without murmurs, rubs, or gallops.  Abdomen: Soft, non-tender, non-distended with normoactive bowel sounds. No hepatomegaly. No rebound/guarding. No obvious abdominal masses. Msk:  Strength and tone appears normal for age. Extremities: No clubbing, cyanosis or edema.  Distal pedal pulses are 2+ and equal bilaterally. Neuro: Alert and oriented X 3. Moves all extremities spontaneously. Psych:  Responds to questions appropriately with a normal affect.  ASSESSMENT AND PLAN: 1. Pericardial effusion by CT- mild to moderate inferior. This is probably related to sickle cell crisis. No evidence of tamponade. BP is low related to pain medication and dehydration. No evidence of rub. No Ecg changes. I do not think this is pericarditis. Will follow up Echo today. Recommend management of sickle cell crisis with analgesics and hydration. Unless Echo shows a large effusion no other cardiac evaluation is needed.  Present on Admission:  . Sickle cell pain crisis  Signed, Garry Bochicchio [place], MDFACC 03/19/2014 7:30 AM

## 2014-03-20 LAB — BASIC METABOLIC PANEL
Anion gap: 4 — ABNORMAL LOW (ref 5–15)
BUN: 8 mg/dL (ref 6–23)
CO2: 23 mmol/L (ref 19–32)
CREATININE: 0.48 mg/dL — AB (ref 0.50–1.10)
Calcium: 8.6 mg/dL (ref 8.4–10.5)
Chloride: 114 mEq/L — ABNORMAL HIGH (ref 96–112)
GFR calc Af Amer: >90 mL/min (ref >90)
GFR calc non Af Amer: >90 mL/min (ref >90)
GLUCOSE: 108 mg/dL — AB (ref 70–99)
Potassium: 3.6 mmol/L (ref 3.5–5.1)
Sodium: 141 mmol/L (ref 135–145)

## 2014-03-20 LAB — CBC WITH DIFFERENTIAL/PLATELET
Basophils Absolute: 0.1 10*3/uL (ref 0.0–0.1)
Basophils Relative: 1 % (ref 0–1)
Eosinophils Absolute: 0.2 10*3/uL (ref 0.0–0.7)
Eosinophils Relative: 2 % (ref 0–5)
HCT: 22.6 % — ABNORMAL LOW (ref 36.0–46.0)
Hemoglobin: 7.9 g/dL — ABNORMAL LOW (ref 12.0–15.0)
LYMPHS ABS: 4.9 10*3/uL — AB (ref 0.7–4.0)
Lymphocytes Relative: 45 % (ref 12–46)
MCH: 36.4 pg — ABNORMAL HIGH (ref 26.0–34.0)
MCHC: 35 g/dL (ref 30.0–36.0)
MCV: 104.1 fL — ABNORMAL HIGH (ref 78.0–100.0)
MONOS PCT: 9 % (ref 3–12)
Monocytes Absolute: 1 10*3/uL (ref 0.1–1.0)
NEUTROS ABS: 4.6 10*3/uL (ref 1.7–7.7)
Neutrophils Relative %: 43 % (ref 43–77)
PLATELETS: 224 10*3/uL (ref 150–400)
RBC: 2.17 MIL/uL — ABNORMAL LOW (ref 3.87–5.11)
RDW: 17.5 % — ABNORMAL HIGH (ref 11.5–15.5)
WBC: 10.7 10*3/uL — AB (ref 4.0–10.5)

## 2014-03-20 LAB — RETICULOCYTES
RBC.: 2.17 MIL/uL — AB (ref 3.87–5.11)
Retic Count, Absolute: 310.3 10*3/uL — ABNORMAL HIGH (ref 19.0–186.0)
Retic Ct Pct: 14.3 % — ABNORMAL HIGH (ref 0.4–3.1)

## 2014-03-20 LAB — LACTATE DEHYDROGENASE: LDH: 247 U/L (ref 94–250)

## 2014-03-20 MED ORDER — SODIUM CHLORIDE 0.9 % IV SOLN
25.0000 mg | INTRAVENOUS | Status: DC | PRN
  Administered 2014-03-27 – 2014-03-28 (×2): 25 mg via INTRAVENOUS
  Filled 2014-03-20 (×5): qty 0.5

## 2014-03-20 MED ORDER — HYDROMORPHONE HCL 1 MG/ML IJ SOLN
1.0000 mg | INTRAMUSCULAR | Status: DC | PRN
Start: 2014-03-20 — End: 2014-03-23
  Administered 2014-03-20 – 2014-03-23 (×20): 1 mg via INTRAVENOUS
  Filled 2014-03-20 (×20): qty 1

## 2014-03-20 MED ORDER — DIPHENHYDRAMINE HCL 25 MG PO CAPS
25.0000 mg | ORAL_CAPSULE | ORAL | Status: DC | PRN
  Administered 2014-03-24 – 2014-03-27 (×4): 50 mg via ORAL
  Filled 2014-03-20 (×4): qty 2

## 2014-03-20 MED ORDER — KETOROLAC TROMETHAMINE 30 MG/ML IJ SOLN
30.0000 mg | Freq: Four times a day (QID) | INTRAMUSCULAR | Status: AC
  Administered 2014-03-20 – 2014-03-24 (×15): 30 mg via INTRAVENOUS
  Filled 2014-03-20 (×26): qty 1

## 2014-03-20 MED ORDER — KETOROLAC TROMETHAMINE 15 MG/ML IJ SOLN
30.0000 mg | Freq: Four times a day (QID) | INTRAMUSCULAR | Status: DC
  Administered 2014-03-20: 30 mg via INTRAVENOUS
  Filled 2014-03-20: qty 2

## 2014-03-20 MED ORDER — HYDROMORPHONE HCL 1 MG/ML IJ SOLN
1.0000 mg | Freq: Once | INTRAMUSCULAR | Status: AC
  Administered 2014-03-20: 1 mg via INTRAVENOUS
  Filled 2014-03-20: qty 1

## 2014-03-20 NOTE — Progress Notes (Signed)
TELEMETRY: Reviewed telemetry pt in NSR: Filed Vitals:   03/19/14 2043 03/20/14 0000 03/20/14 0005 03/20/14 0400  BP: 92/51  93/50 95/51  Pulse: 64  72 55  Temp: 98.1 F (36.7 C)  98.2 F (36.8 C) 98 F (36.7 C)  TempSrc: Oral  Oral Oral  Resp: 14 15 15 10   Height:      Weight:      SpO2: 100% 98% 98% 100%    Intake/Output Summary (Last 24 hours) at 03/20/14 0748 Last data filed at 03/20/14 0300  Gross per 24 hour  Intake 1292.5 ml  Output      3 ml  Net 1289.5 ml   Filed Weights   03/18/14 2320  Weight: 115 lb 11.9 oz (52.5 kg)    Subjective Patient complains of pain in her chest- all over and down her arms. Worse with movement, cough, deep breath. Also complains of pain in her legs.   . folic acid  1 mg Oral Daily  . gabapentin  300 mg Oral TID  . heparin  5,000 Units Subcutaneous 3 times per day  . HYDROmorphone PCA 2 mg/mL   Intravenous 6 times per day  . hydroxyurea  500 mg Oral Daily  . ketorolac  15 mg Intravenous 4 times per day  . senna-docusate  1 tablet Oral BID  . sodium chloride  3 mL Intravenous Q12H  . topiramate  50 mg Oral BID   . dextrose 5 % and 0.45% NaCl 75 mL/hr at 03/20/14 0006    LABS: Basic Metabolic Panel:  Recent Labs  62/13/0801/17/16 1811 03/19/14 0414 03/20/14 0515  NA 139 141 141  K 2.8* 4.0 3.6  CL 108 112 114*  CO2 23 24 23   GLUCOSE 86 107* 108*  BUN <5* <5* 8  CREATININE 0.39* 0.38* 0.48*  CALCIUM 8.8 8.4 8.6  MG 1.9  --   --    Liver Function Tests:  Recent Labs  03/18/14 1813 03/19/14 0414  AST 45* 38*  ALT 28 22  ALKPHOS 94 82  BILITOT 2.3* 1.8*  PROT 7.6 6.2  ALBUMIN 4.3 3.4*   No results for input(s): LIPASE, AMYLASE in the last 72 hours. CBC:  Recent Labs  03/19/14 0414 03/20/14 0515  WBC 12.4* 10.7*  NEUTROABS 6.1 4.6  HGB 7.7* 7.9*  HCT 22.7* 22.6*  MCV 104.1* 104.1*  PLT 263 224   Cardiac Enzymes:  Recent Labs  03/18/14 1912 03/19/14 0414  TROPONINI <0.03 <0.03    BNP: 78.1    Radiology/Studies:  Ct Angio Chest Pe W/cm &/or Wo Cm  03/18/2014   CLINICAL DATA:  Chest pain. Shortness of breath. Sickle cell disease.  EXAM: CT ANGIOGRAPHY CHEST WITH CONTRAST  TECHNIQUE: Multidetector CT imaging of the chest was performed using the standard protocol during bolus administration of intravenous contrast. Multiplanar CT image reconstructions and MIPs were obtained to evaluate the vascular anatomy.  CONTRAST:  100mL OMNIPAQUE IOHEXOL 350 MG/ML SOLN  COMPARISON:  Multiple exams, including 02/09/2014 and 03/18/2014  FINDINGS: No filling defect is identified in the pulmonary arterial tree to suggest pulmonary embolus. No acute aortic findings. Stably prominent thymus. Pericardial effusion inferiorly.  Small type 1 hiatal hernia.  Trace right pleural effusion.  No pathologic thoracic adenopathy. Subsegmental atelectasis anteriorly in the right lower lobe.  4.1 cm catheter fragment is stuck in The brachiocephalic vein.  Mild periportal edema in the upper abdomen. Small hyperdense spleen. Sclerosis in the sternum and in portions of the sixth and  eighth thoracic vertebra are likely a manifestation of sickle cell disease.  Review of the MIP images confirms the above findings.  IMPRESSION: 1. No pulmonary embolus or acute aortic findings. 2. Small to moderate pericardial effusion inferiorly. This could be further assessed with echocardiography if clinically warranted, particularly if the patient has jugular venous distension or other signs of tamponade. 3. Trace right pleural effusion. Subsegmental atelectasis anterolaterally in the right lower lobe. 4. Small type 1 hiatal hernia. 5. Mild periportal edema. 6. Manifestations of sickle cell disease include a small hyperdense spleen, thymic hyperplasia, and osseous manifestations.   Electronically Signed   By: Herbie Baltimore M.D.   On: 03/18/2014 20:22   Dg Chest Port 1 View  03/18/2014   CLINICAL DATA:  Chest pain. Weakness. Sickle  cell disease. Shortness of breath.  EXAM: PORTABLE CHEST - 1 VIEW  COMPARISON:  02/09/2014 CT scan  FINDINGS: Upper normal heart size. Indistinct pulmonary vasculature. Right-sided Port-A-Cath tip: Right atrium. Catheter fragment is present in the brachiocephalic vein, a chronic finding.  No airspace opacity identified.  IMPRESSION: 1. Mildly prominent upper zone pulmonary vasculature may be incidental given the semi-erect nature of today's exam, but could reflect pulmonary venous hypertension. Upper normal heart size. 2. Stable chronic catheter fragment in the brachia cephalic vein. 3. No airspace opacity observed.   Electronically Signed   By: Herbie Baltimore M.D.   On: 03/18/2014 17:45   Ecg is normal.   Echo: 03/19/13:Study Conclusions  - Left ventricle: The cavity size was normal. Wall thickness was normal. Systolic function was normal. The estimated ejection fraction was in the range of 55% to 60%. Wall motion was normal; there were no regional wall motion abnormalities. Left ventricular diastolic function parameters were normal. - Pericardium, extracardiac: A trivial pericardial effusion was identified posterior to the heart.  PHYSICAL EXAM General: Well developed, well nourished, young female in no acute distress. Head: Normocephalic, atraumatic, sclera non-icteric, oropharynx is clear Neck: Negative for carotid bruits. JVD not elevated. No adenopathy Lungs: Clear bilaterally to auscultation without wheezes, rales, or rhonchi. Breathing is unlabored. Heart: RRR S1 S2 without murmurs, rubs, or gallops.  Abdomen: Soft, non-tender. Extremities: No clubbing, cyanosis or edema.  Distal pedal pulses are 2+ and equal bilaterally. Neuro: Alert and oriented X 3. Moves all extremities spontaneously. Psych:  Responds to questions appropriately with a normal affect.  ASSESSMENT AND PLAN: 1. Chest pain. This is probably related to sickle cell crisis. No Significant pericardial effusion  by Echo (trivial).  BP is low related to pain medication and dehydration. No evidence of rub. No Ecg changes. I do not think this is pericarditis.  Recommend management of sickle cell crisis with analgesics and hydration. No other cardiac evaluation is needed. I will sign off.  Present on Admission:  . Sickle cell pain crisis  Signed, Peter [place], MDFACC 03/20/2014 7:48 AM

## 2014-03-20 NOTE — Progress Notes (Signed)
SICKLE CELL SERVICE PROGRESS NOTE  Brittany Foster NWG:[id number] DOB: 08-Aug-1985 DOA: 03/18/2014 PCP: [name],[name], MD  Assessment/Plan: Active Problems:   Sickle cell pain crisis   Pericardial effusion  1. Hb SS with crisis; Pt has used 38 gm with 83/76: demands/deliveries. She still rates her pain as 8/10 and reports that it is localized to her chest wall. I will add cliniician assisted doses of Dilaudid. Continue Ketorolac at 30 mg IV q 6 hours. Decrease IVF to [facility] as patient is eating and drinking well. Consider basal dose overnight. 2. Leukocytosis: No evidence of infection. Attribute to crisis. Continue to monitor. 3. Anemia of chronic disease: Hb stable 4. Sickle Cell Disease: Pt is on Hydrea and tolerating it well. Her MCV is consistent with compliance of medication. Continue folic acid. 5. Small Peri0-cardial effusion: clinically insignificant  Code Status: Full Code Family Communication: N/A Disposition Plan: Not yet ready for discharge  [name],[name]  Pager [phone]. If 7PM-7AM, please contact night-coverage.  03/20/2014, 2:58 PM  LOS: 2 days   Consultants:  None  Procedures:  None  Antibiotics:  None  HPI/Subjective: She still rates her pain as 8/10 and reports that it is localized to her chest wall.   Objective: Filed Vitals:   03/20/14 0005 03/20/14 0400 03/20/14 0800 03/20/14 1059  BP: 93/50 95/51    Pulse: 72 55    Temp: 98.2 F (36.8 C) 98 F (36.7 C)    TempSrc: Oral Oral    Resp: Height:      Weight:      SpO2: 98% 100% 100% 100%   Weight change:   Intake/Output Summary (Last 24 hours) at 03/20/14 1458 Last data filed at 03/20/14 0300  Gross per 24 hour  Intake 1292.5 ml  Output      2 ml  Net 1290.5 ml    General: Alert, awake, oriented x3, in no acute distress.  HEENT: [place]/AT PEERL, EOMI, anicteric Neck: Trachea midline,  no masses, no thyromegal,y no JVD, no carotid bruit OROPHARYNX:  Moist, No exudate/  erythema/lesions.  Heart: Regular rate and rhythm, without murmurs, rubs, gallops, PMI non-displaced, no heaves or thrills on palpation.  Lungs: Clear to auscultation, no wheezing or rhonchi noted. No increased vocal fremitus resonant to percussion  Abdomen: Soft, nontender, nondistended, positive bowel sounds, no masses no hepatosplenomegaly noted..  Neuro: No focal neurological deficits noted cranial nerves II through XII grossly intact. Strength normal in bilateral upper and lower extremities. Musculoskeletal: No warm swelling or erythema around joints, no spinal tenderness noted. Psychiatric: Patient alert and oriented x3, good insight and cognition, good recent to remote recall. Lymph node survey: No cervical axillary or inguinal lymphadenopathy noted.   Data Reviewed: Basic Metabolic Panel:  Recent Labs Lab 03/18/14 1811 03/19/14 0414 03/20/14 0515  NA 139 141 141  K 2.8* 4.0 3.6  CL 108 112 114*  CO2 GLUCOSE 86 107* 108*  BUN <5* <5* 8  CREATININE 0.39* 0.38* 0.48*  CALCIUM 8.8 8.4 8.6  MG 1.9  --   --    Liver Function Tests:  Recent Labs Lab 03/18/14 1813 03/19/14 0414  AST 45* 38*  ALT 28 22  ALKPHOS 94 82  BILITOT 2.3* 1.8*  PROT 7.6 6.2  ALBUMIN 4.3 3.4*   No results for input(s): LIPASE, AMYLASE in the last 168 hours. No results for input(s): AMMONIA in the last 168 hours. CBC:  Recent Labs Lab 03/18/14 1813 03/19/14 0414 03/20/14  0515  WBC 12.3* 12.4* 10.7*  NEUTROABS 6.6 6.1 4.6  HGB 9.2* 7.7* 7.9*  HCT 26.8* 22.7* 22.6*  MCV 102.3* 104.1* 104.1*  PLT 307 263 224   Cardiac Enzymes:  Recent Labs Lab 03/18/14 1912 03/19/14 0414  TROPONINI <0.03 <0.03   BNP (last 3 results) No results for input(s): PROBNP in the last 8760 hours. CBG: No results for input(s): GLUCAP in the last 168 hours.  No results found for this or any previous visit (from the past 240 hour(s)).   Studies: Ct Angio Chest Pe W/cm &/or Wo Cm  03/18/2014    CLINICAL DATA:  Chest pain. Shortness of breath. Sickle cell disease.  EXAM: CT ANGIOGRAPHY CHEST WITH CONTRAST  TECHNIQUE: Multidetector CT imaging of the chest was performed using the standard protocol during bolus administration of intravenous contrast. Multiplanar CT image reconstructions and MIPs were obtained to evaluate the vascular anatomy.  CONTRAST:  100mL OMNIPAQUE IOHEXOL 350 MG/ML SOLN  COMPARISON:  Multiple exams, including 02/09/2014 and 03/18/2014  FINDINGS: No filling defect is identified in the pulmonary arterial tree to suggest pulmonary embolus. No acute aortic findings. Stably prominent thymus. Pericardial effusion inferiorly.  Small type 1 hiatal hernia.  Trace right pleural effusion.  No pathologic thoracic adenopathy. Subsegmental atelectasis anteriorly in the right lower lobe.  4.1 cm catheter fragment is stuck in The brachiocephalic vein.  Mild periportal edema in the upper abdomen. Small hyperdense spleen. Sclerosis in the sternum and in portions of the sixth and eighth thoracic vertebra are likely a manifestation of sickle cell disease.  Review of the MIP images confirms the above findings.  IMPRESSION: 1. No pulmonary embolus or acute aortic findings. 2. Small to moderate pericardial effusion inferiorly. This could be further assessed with echocardiography if clinically warranted, particularly if the patient has jugular venous distension or other signs of tamponade. 3. Trace right pleural effusion. Subsegmental atelectasis anterolaterally in the right lower lobe. 4. Small type 1 hiatal hernia. 5. Mild periportal edema. 6. Manifestations of sickle cell disease include a small hyperdense spleen, thymic hyperplasia, and osseous manifestations.   Electronically Signed   By: Herbie BaltimoreWalt  Liebkemann M.D.   On: 03/18/2014 20:22   Dg Chest Port 1 View  03/18/2014   CLINICAL DATA:  Chest pain. Weakness. Sickle cell disease. Shortness of breath.  EXAM: PORTABLE CHEST - 1 VIEW  COMPARISON:  02/09/2014  CT scan  FINDINGS: Upper normal heart size. Indistinct pulmonary vasculature. Right-sided Port-A-Cath tip: Right atrium. Catheter fragment is present in the brachiocephalic vein, a chronic finding.  No airspace opacity identified.  IMPRESSION: 1. Mildly prominent upper zone pulmonary vasculature may be incidental given the semi-erect nature of today's exam, but could reflect pulmonary venous hypertension. Upper normal heart size. 2. Stable chronic catheter fragment in the brachia cephalic vein. 3. No airspace opacity observed.   Electronically Signed   By: Herbie BaltimoreWalt  Liebkemann M.D.   On: 03/18/2014 17:45    Scheduled Meds: . folic acid  1 mg Oral Daily  . gabapentin  300 mg Oral TID  . heparin  5,000 Units Subcutaneous 3 times per day  . HYDROmorphone PCA 2 mg/mL   Intravenous 6 times per day  . hydroxyurea  500 mg Oral Daily  . ketorolac  15 mg Intravenous 4 times per day  . senna-docusate  1 tablet Oral BID  . sodium chloride  3 mL Intravenous Q12H  . topiramate  50 mg Oral BID   Continuous Infusions: . dextrose 5 % and 0.45% NaCl  75 mL/hr at 03/20/14 0006    Total time.

## 2014-03-21 LAB — ANA: Anti Nuclear Antibody(ANA): NEGATIVE

## 2014-03-21 MED ORDER — HYDROMORPHONE 2 MG/ML HIGH CONCENTRATION IV PCA SOLN
INTRAVENOUS | Status: DC
Start: 2014-03-21 — End: 2014-03-23
  Administered 2014-03-21: 2 mg via INTRAVENOUS
  Administered 2014-03-22: 22:00:00 via INTRAVENOUS
  Administered 2014-03-22: 7.97 mg via INTRAVENOUS
  Administered 2014-03-22: 7.8 mg via INTRAVENOUS
  Administered 2014-03-22: 1.41 mg via INTRAVENOUS
  Administered 2014-03-22: 5.4 mg via INTRAVENOUS
  Administered 2014-03-22: 11.69 mg via INTRAVENOUS
  Administered 2014-03-23: 5.54 mg via INTRAVENOUS
  Administered 2014-03-23: 6.18 mg via INTRAVENOUS
  Administered 2014-03-23: 5.65 mg via INTRAVENOUS
  Filled 2014-03-21 (×3): qty 25

## 2014-03-21 NOTE — Progress Notes (Signed)
SICKLE CELL SERVICE PROGRESS NOTE  Brittany Foster ZOX:[id number] DOB: 12/27/1985 DOA: 03/18/2014 PCP: [name],[name], MD  Assessment/Plan: Active Problems:   Sickle cell pain crisis   Pericardial effusion  1. Hb SS with crisis; Pt has used 41.5 mg with 129/83: demands/deliveries. She still rates her pain as 8/10 and reports that it is localized to her chest wall. I will add basal dose in addition to the  cliniician assisted doses of Dilaudid. Continue Ketorolac at 30 mg IV q 6 hours. [facility] is [facility] and drinking well.  2. Leukocytosis: No evidence of infection. Attribute to crisis. Continue to monitor. 3. Anemia of chronic disease: Re-check Hb in the morning 4. Sickle Cell Disease: Pt is on Hydrea and tolerating it well. Her MCV is consistent with compliance of medication. Continue folic acid. 5. Small Peri-cardial effusion: clinically insignificant. Appreciate input from Dr. [place].  Code Status: Full Code Family Communication: N/A Disposition Plan: Not yet ready for discharge  [name],[name]  Pager [phone]. If 7PM-7AM, please contact night-coverage.  03/21/2014, 12:32 PM  LOS: 3 days   Consultants:  None  Procedures:  None  Antibiotics:  None  HPI/Subjective: She still rates her pain as 7/10 and reports that it is localized to her chest wall. Lat BM last night.  Objective: Filed Vitals:   03/20/14 2354 03/21/14 0400 03/21/14 0834 03/21/14 1142  BP: 105/55     Pulse: 82     Temp: 98.5 F (36.9 C)     TempSrc: Oral     Resp: 18 16 13 11   Height:      Weight:      SpO2: 99% 99% 100% 100%   Weight change:   Intake/Output Summary (Last 24 hours) at 03/21/14 1232 Last data filed at 03/20/14 2031  Gross per 24 hour  Intake   1141 ml  Output      0 ml  Net   1141 ml    General: Alert, awake, oriented x3, in no acute distress. [place] HEENT: [place]/AT [place]ERL, EOMI, anicteric Neck: Trachea midline,  no masses, no thyromegal,y  no JVD, no carotid bruit OROPHARYNX:  Moist, No exudate/ erythema/lesions.  Heart: Regular rate and rhythm, without murmurs, rubs, gallops, PMI non-displaced, no heaves or thrills on palpation.  Lungs: Clear to auscultation, no wheezing or rhonchi noted. No increased vocal fremitus resonant to percussion  Abdomen: Soft, nontender, nondistended, positive bowel sounds, no masses no hepatosplenomegaly noted.  Neuro: No focal neurological deficits noted cranial nerves II through XII grossly intact. Strength normal in bilateral upper and lower extremities. Musculoskeletal: No warm swelling or erythema around joints, no spinal tenderness noted. Psychiatric: Patient alert and oriented x3, good insight and cognition, good recent to remote recall. Lymph node survey: No cervical axillary or inguinal lymphadenopathy noted.   Data Reviewed: Basic Metabolic Panel:  Recent Labs Lab 03/18/14 1811 03/19/14 0414 03/20/14 0515  NA 139 141 141  K 2.8* 4.0 3.6  CL 108 112 114*  CO2 23 24 23   GLUCOSE 86 107* 108*  BUN <5* <5* 8  CREATININE 0.39* 0.38* 0.48*  CALCIUM 8.8 8.4 8.6  MG 1.9  --   --    Liver Function Tests:  Recent Labs Lab 03/18/14 1813 03/19/14 0414  AST 45* 38*  ALT 28 22  ALKPHOS 94 82  BILITOT 2.3* 1.8*  PROT 7.6 6.2  ALBUMIN 4.3 3.4*   No results for input(s): LIPASE, AMYLASE in the last 168 hours. No results for  input(s): AMMONIA in the last 168 hours. CBC:  Recent Labs Lab 03/18/14 1813 03/19/14 0414 03/20/14 0515  WBC 12.3* 12.4* 10.7*  NEUTROABS 6.6 6.1 4.6  HGB 9.2* 7.7* 7.9*  HCT 26.8* 22.7* 22.6*  MCV 102.3* 104.1* 104.1*  PLT 307 263 224   Cardiac Enzymes:  Recent Labs Lab 03/18/14 1912 03/19/14 0414  TROPONINI <0.03 <0.03   BNP (last 3 results) No results for input(s): PROBNP in the last 8760 hours. CBG: No results for input(s): GLUCAP in the last 168 hours.  No results found for this or any previous visit (from the past 240 hour(s)).    Studies: Ct Angio Chest Pe W/cm &/or Wo Cm  03/18/2014   CLINICAL DATA:  Chest pain. Shortness of breath. Sickle cell disease.  EXAM: CT ANGIOGRAPHY CHEST WITH CONTRAST  TECHNIQUE: Multidetector CT imaging of the chest was performed using the standard protocol during bolus administration of intravenous contrast. Multiplanar CT image reconstructions and MIPs were obtained to evaluate the vascular anatomy.  CONTRAST:  OMNIPAQUE IOHEXOL 350 MG/ML SOLN  COMPARISON:  Multiple exams, including 02/09/2014 and 03/18/2014  FINDINGS: No filling defect is identified in the pulmonary arterial tree to suggest pulmonary embolus. No acute aortic findings. Stably prominent thymus. Pericardial effusion inferiorly.  Small type 1 hiatal hernia.  Trace right pleural effusion.  No pathologic thoracic adenopathy. Subsegmental atelectasis anteriorly in the right lower lobe.  4.1 cm catheter fragment is stuck in The brachiocephalic vein.  Mild periportal edema in the upper abdomen. Small hyperdense spleen. Sclerosis in the sternum and in portions of the sixth and eighth thoracic vertebra are likely a manifestation of sickle cell disease.  Review of the MIP images confirms the above findings.  IMPRESSION: 1. No pulmonary embolus or acute aortic findings. 2. Small to moderate pericardial effusion inferiorly. This could be further assessed with echocardiography if clinically warranted, particularly if the patient has jugular venous distension or other signs of tamponade. 3. Trace right pleural effusion. Subsegmental atelectasis anterolaterally in the right lower lobe. 4. Small type 1 hiatal hernia. 5. Mild periportal edema. 6. Manifestations of sickle cell disease include a small hyperdense spleen, thymic hyperplasia, and osseous manifestations.   Electronically Signed   By: Herbie Baltimore M.D.   On: 03/18/2014 20:22   Dg Chest Port 1 View  03/18/2014   CLINICAL DATA:  Chest pain. Weakness. Sickle cell disease. Shortness of  breath.  EXAM: PORTABLE CHEST - 1 VIEW  COMPARISON:  02/09/2014 CT scan  FINDINGS: Upper normal heart size. Indistinct pulmonary vasculature. Right-sided Port-A-Cath tip: Right atrium. Catheter fragment is present in the brachiocephalic vein, a chronic finding.  No airspace opacity identified.  IMPRESSION: 1. Mildly prominent upper zone pulmonary vasculature may be incidental given the semi-erect nature of today's exam, but could reflect pulmonary venous hypertension. Upper normal heart size. 2. Stable chronic catheter fragment in the brachia cephalic vein. 3. No airspace opacity observed.   Electronically Signed   By: Herbie Baltimore M.D.   On: 03/18/2014 17:45    Scheduled Meds: . folic acid  1 mg Oral Daily  . gabapentin  300 mg Oral TID  . heparin  5,000 Units Subcutaneous 3 times per day  . HYDROmorphone PCA 2 mg/mL   Intravenous 6 times per day  . hydroxyurea  500 mg Oral Daily  . ketorolac  30 mg Intravenous 4 times per day  . senna-docusate  1 tablet Oral BID  . sodium chloride  3 mL Intravenous Q12H  .  topiramate  50 mg Oral BID   Continuous Infusions: . dextrose 5 % and 0.45% NaCl 75 mL/hr at 03/20/14 1711    Total time 40 minutes.

## 2014-03-22 NOTE — Progress Notes (Signed)
Subjective: A 29 year old female readmitted with sickle cell painful crisis. Patient has had frequent hospital admissions lately. She was having pain at 10 out of 10 yesterday but today is slightly improved 9 out of 10. She is on the Dilaudid PCA was basal rate on physician assisted dosing. Patient says she feels miserable. She has continued to take her Hydrea. Patient was found to have small trivial pericardial effusion. She has used 44.25 mg of the Dilaudid in the last 24 hours was 140 demands and 95 deliveries. No shortness of breath, no cough no nausea vomiting and diarrhea.  Objective: Vital signs in last 24 hours: Temp:  [97.9 F (36.6 C)-98.6 F (37 C)] 98.3 F (36.8 C) (01/21 1010) Pulse Rate:  [66-77] 68 (01/21 1010) Resp:  [11-20] 18 (01/21 1010) BP: (92-103)/(45-56) 94/45 mmHg (01/21 1010) SpO2:  [100 %] 100 % (01/21 1010) FiO2 (%):  [1 %] 1 % (01/20 1357) Weight change:  Last BM Date: 03/21/14  Intake/Output from previous day: 01/20 0701 - 01/21 0700 In: 900 [I.V.:900] Out: -  Intake/Output this shift: Total I/O In: -  Out: 1 [Urine:1]  General appearance: alert, cooperative, appears stated age and no distress Eyes: conjunctivae/corneas clear. PERRL, EOM's intact. Fundi benign. Ears: normal TM's and external ear canals both ears Nose: Nares normal. Septum midline. Mucosa normal. No drainage or sinus tenderness. Neck: no adenopathy, no carotid bruit, no JVD, supple, symmetrical, trachea midline and thyroid not enlarged, symmetric, no tenderness/mass/nodules Back: symmetric, no curvature. ROM normal. No CVA tenderness. Resp: clear to auscultation bilaterally Chest wall: no tenderness Cardio: regular rate and rhythm, S1, S2 normal, no murmur, click, rub or gallop GI: soft, non-tender; bowel sounds normal; no masses,  no organomegaly Extremities: extremities normal, atraumatic, no cyanosis or edema Pulses: 2+ and symmetric Skin: Skin color, texture, turgor normal. No  rashes or lesions Neurologic: Grossly normal  Lab Results:  Recent Labs  03/20/14 0515  WBC 10.7*  HGB 7.9*  HCT 22.6*  PLT 224   BMET  Recent Labs  03/20/14 0515  NA 141  K 3.6  CL 114*  CO2 23  GLUCOSE 108*  BUN 8  CREATININE 0.48*  CALCIUM 8.6    Studies/Results: No results found.  Medications: I have reviewed the patient's current medications.  Assessment/Plan: A 29 year old female admitted with sickle cell painful crisis.  #1 sickle cell painful crisis: Patient will be maintain on the Dilaudid PCA. I will continue his a basal rate for another 24 hours. Also a physician assisted dosing as needed. I will discontinue the basal rate at that point and resume her oral medications. Continue with supportive care and Toradol. Next  #2 sickle cell anemia: No evidence of active hemolysis. Continue to monitor her hemoglobin  #3 leukocytosis: Secondary to sickle cell crisis. Patient seems to be doing better.  #4 pericardial effusion: This is small. Presumed insignificant  #5 dehydration: We'll continue to actively hydrate patient.  LOS: 4 days   GARBA,LAWAL 03/22/2014, 10:48 AM

## 2014-03-23 LAB — COMPREHENSIVE METABOLIC PANEL
ALT: 30 U/L (ref 0–35)
ANION GAP: 4 — AB (ref 5–15)
AST: 53 U/L — AB (ref 0–37)
Albumin: 3.3 g/dL — ABNORMAL LOW (ref 3.5–5.2)
Alkaline Phosphatase: 86 U/L (ref 39–117)
BUN: 6 mg/dL (ref 6–23)
CALCIUM: 8.3 mg/dL — AB (ref 8.4–10.5)
CHLORIDE: 114 mmol/L — AB (ref 96–112)
CO2: 21 mmol/L (ref 19–32)
Creatinine, Ser: 0.45 mg/dL — ABNORMAL LOW (ref 0.50–1.10)
GFR calc Af Amer: >90 mL/min (ref >90)
GFR calc non Af Amer: >90 mL/min (ref >90)
Glucose, Bld: 128 mg/dL — ABNORMAL HIGH (ref 70–99)
Potassium: 3.4 mmol/L — ABNORMAL LOW (ref 3.5–5.1)
Sodium: 139 mmol/L (ref 135–145)
TOTAL PROTEIN: 6.1 g/dL (ref 6.0–8.3)
Total Bilirubin: 2 mg/dL — ABNORMAL HIGH (ref 0.3–1.2)

## 2014-03-23 LAB — CBC WITH DIFFERENTIAL/PLATELET
Basophils Absolute: 0.1 10*3/uL (ref 0.0–0.1)
Basophils Relative: 1 % (ref 0–1)
EOS PCT: 4 % (ref 0–5)
Eosinophils Absolute: 0.4 10*3/uL (ref 0.0–0.7)
HEMATOCRIT: 21.1 % — AB (ref 36.0–46.0)
HEMOGLOBIN: 7.5 g/dL — AB (ref 12.0–15.0)
Lymphocytes Relative: 36 % (ref 12–46)
Lymphs Abs: 3.4 10*3/uL (ref 0.7–4.0)
MCH: 35.5 pg — ABNORMAL HIGH (ref 26.0–34.0)
MCHC: 35.5 g/dL (ref 30.0–36.0)
MCV: 100 fL (ref 78.0–100.0)
Monocytes Absolute: 1 10*3/uL (ref 0.1–1.0)
Monocytes Relative: 10 % (ref 3–12)
NEUTROS ABS: 4.7 10*3/uL (ref 1.7–7.7)
Neutrophils Relative %: 49 % (ref 43–77)
PLATELETS: 221 10*3/uL (ref 150–400)
RBC: 2.11 MIL/uL — AB (ref 3.87–5.11)
RDW: 16.9 % — ABNORMAL HIGH (ref 11.5–15.5)
WBC: 9.4 10*3/uL (ref 4.0–10.5)

## 2014-03-23 MED ORDER — ALTEPLASE 2 MG IJ SOLR
2.0000 mg | Freq: Once | INTRAMUSCULAR | Status: AC
  Administered 2014-03-23: 2 mg
  Filled 2014-03-23: qty 2

## 2014-03-23 MED ORDER — HYDROMORPHONE 2 MG/ML HIGH CONCENTRATION IV PCA SOLN
INTRAVENOUS | Status: DC
Start: 2014-03-23 — End: 2014-03-28
  Administered 2014-03-23: 9.11 mg via INTRAVENOUS
  Administered 2014-03-23: 5.39 mg via INTRAVENOUS
  Administered 2014-03-24: 6.5 mg via INTRAVENOUS
  Administered 2014-03-24: 2.94 mg via INTRAVENOUS
  Administered 2014-03-24: 7 mg via INTRAVENOUS
  Administered 2014-03-24: 5.5 mg via INTRAVENOUS
  Administered 2014-03-24: 5.99 mg via INTRAVENOUS
  Administered 2014-03-24: 7.1 mg via INTRAVENOUS
  Administered 2014-03-24: 04:00:00 via INTRAVENOUS
  Administered 2014-03-25: 3.5 mg via INTRAVENOUS
  Administered 2014-03-25: 1 mg via INTRAVENOUS
  Administered 2014-03-25: 8 mg via INTRAVENOUS
  Administered 2014-03-25: 17:00:00 via INTRAVENOUS
  Administered 2014-03-25: 5.5 mg via INTRAVENOUS
  Administered 2014-03-25: 4 mg via INTRAVENOUS
  Administered 2014-03-26: 5 mg via INTRAVENOUS
  Administered 2014-03-26: 3.5 mg via INTRAVENOUS
  Administered 2014-03-26: 4 mg via INTRAVENOUS
  Administered 2014-03-26: 1 mg via INTRAVENOUS
  Administered 2014-03-26: 7.5 mg via INTRAVENOUS
  Administered 2014-03-26: 3.5 mg via INTRAVENOUS
  Administered 2014-03-27: 2.99 mg via INTRAVENOUS
  Administered 2014-03-27: 09:00:00 via INTRAVENOUS
  Administered 2014-03-27: 4.5 mg via INTRAVENOUS
  Administered 2014-03-27: 6 mg via INTRAVENOUS
  Administered 2014-03-27: 4.5 mg via INTRAVENOUS
  Administered 2014-03-27: 5.5 mg via INTRAVENOUS
  Administered 2014-03-27: 7 mg via INTRAVENOUS
  Administered 2014-03-28: 2.5 mg via INTRAVENOUS
  Administered 2014-03-28: 2 mg via INTRAVENOUS
  Administered 2014-03-28: 6 mg via INTRAVENOUS
  Administered 2014-03-28: 5 mg via INTRAVENOUS
  Filled 2014-03-23 (×3): qty 25

## 2014-03-23 NOTE — Progress Notes (Signed)
Subjective: Patient is complaining of worse pain today compared to yesterday because she used incentive spirometer and walked at the same time. She is still on Dilaudid PCA and has used 42.5mg  in 24 hours. Still on the basal rate and physician assisted dosing of 1 mg Q 3hours. No shortness of breath, no cough no nausea vomiting and diarrhea.  Objective: Vital signs in last 24 hours: Temp:  [98.1 F (36.7 C)-98.4 F (36.9 C)] 98.1 F (36.7 C) (01/22 0500) Pulse Rate:  [75-87] 75 (01/22 0500) Resp:  [14-18] 18 (01/22 1151) BP: (92-101)/(45-50) 95/50 mmHg (01/22 0500) SpO2:  [96 %-100 %] 99 % (01/22 1151) Weight change:  Last BM Date: 03/21/14  Intake/Output from previous day: 01/21 0701 - 01/22 0700 In: 2940 [P.O.:240; I.V.:2700] Out: 2 [Urine:2] Intake/Output this shift:    General appearance: alert, cooperative, appears stated age and no distress Eyes: conjunctivae/corneas clear. PERRL, EOM's intact. Fundi benign. Ears: normal TM's and external ear canals both ears Nose: Nares normal. Septum midline. Mucosa normal. No drainage or sinus tenderness. Neck: no adenopathy, no carotid bruit, no JVD, supple, symmetrical, trachea midline and thyroid not enlarged, symmetric, no tenderness/mass/nodules Back: symmetric, no curvature. ROM normal. No CVA tenderness. Resp: clear to auscultation bilaterally Chest wall: no tenderness Cardio: regular rate and rhythm, S1, S2 normal, no murmur, click, rub or gallop GI: soft, non-tender; bowel sounds normal; no masses,  no organomegaly Extremities: extremities normal, atraumatic, no cyanosis or edema Pulses: 2+ and symmetric Skin: Skin color, texture, turgor normal. No rashes or lesions Neurologic: Grossly normal  Lab Results: No results for input(s): WBC, HGB, HCT, PLT in the last 72 hours. BMET No results for input(s): NA, K, CL, CO2, GLUCOSE, BUN, CREATININE, CALCIUM in the last 72 hours.  Studies/Results: No results found.  Medications:  I have reviewed the patient's current medications.  Assessment/Plan: A 29 year old female admitted with sickle cell painful crisis.  #1 sickle cell painful crisis: Patient will be maintained on the Dilaudid PCA. I will continue the basal rate for another 24 hours. Will DC the physician assisted dosing.  #2 sickle cell anemia: Continue to monitor her hemoglobin  #3 leukocytosis: Secondary to sickle cell crisis. Patient seems to be doing better.  #4 pericardial effusion: This is small. Presumed insignificant  #5 dehydration: We'll continue to actively hydrate patient.  LOS: 5 days   Jasani Lengel,LAWAL 03/23/2014, 11:52 AM

## 2014-03-24 DIAGNOSIS — E876 Hypokalemia: Secondary | ICD-10-CM

## 2014-03-24 DIAGNOSIS — D72829 Elevated white blood cell count, unspecified: Secondary | ICD-10-CM

## 2014-03-24 LAB — CBC WITH DIFFERENTIAL/PLATELET
BASOS PCT: 1 % (ref 0–1)
Basophils Absolute: 0.1 10*3/uL (ref 0.0–0.1)
EOS PCT: 5 % (ref 0–5)
Eosinophils Absolute: 0.5 10*3/uL (ref 0.0–0.7)
HEMATOCRIT: 19.8 % — AB (ref 36.0–46.0)
HEMOGLOBIN: 7 g/dL — AB (ref 12.0–15.0)
Lymphocytes Relative: 38 % (ref 12–46)
Lymphs Abs: 3.9 10*3/uL (ref 0.7–4.0)
MCH: 35.4 pg — ABNORMAL HIGH (ref 26.0–34.0)
MCHC: 35.4 g/dL (ref 30.0–36.0)
MCV: 100 fL (ref 78.0–100.0)
MONO ABS: 1.2 10*3/uL — AB (ref 0.1–1.0)
Monocytes Relative: 12 % (ref 3–12)
NEUTROS ABS: 4.6 10*3/uL (ref 1.7–7.7)
NEUTROS PCT: 44 % (ref 43–77)
Platelets: 227 10*3/uL (ref 150–400)
RBC: 1.98 MIL/uL — ABNORMAL LOW (ref 3.87–5.11)
RDW: 17.3 % — ABNORMAL HIGH (ref 11.5–15.5)
WBC: 10.2 10*3/uL (ref 4.0–10.5)

## 2014-03-24 LAB — COMPREHENSIVE METABOLIC PANEL
ALK PHOS: 81 U/L (ref 39–117)
ALT: 31 U/L (ref 0–35)
AST: 57 U/L — ABNORMAL HIGH (ref 0–37)
Albumin: 3.2 g/dL — ABNORMAL LOW (ref 3.5–5.2)
Anion gap: 5 (ref 5–15)
BILIRUBIN TOTAL: 2 mg/dL — AB (ref 0.3–1.2)
BUN: 8 mg/dL (ref 6–23)
CALCIUM: 8.2 mg/dL — AB (ref 8.4–10.5)
CO2: 19 mmol/L (ref 19–32)
CREATININE: 0.48 mg/dL — AB (ref 0.50–1.10)
Chloride: 115 mmol/L — ABNORMAL HIGH (ref 96–112)
GFR calc Af Amer: >90 mL/min (ref >90)
Glucose, Bld: 131 mg/dL — ABNORMAL HIGH (ref 70–99)
POTASSIUM: 3.7 mmol/L (ref 3.5–5.1)
Sodium: 139 mmol/L (ref 135–145)
Total Protein: 6 g/dL (ref 6.0–8.3)

## 2014-03-24 MED ORDER — HYDROMORPHONE HCL 1 MG/ML IJ SOLN
0.5000 mg | Freq: Once | INTRAMUSCULAR | Status: AC
Start: 2014-03-24 — End: 2014-03-24
  Administered 2014-03-24: 0.5 mg via INTRAVENOUS
  Filled 2014-03-24: qty 1

## 2014-03-24 NOTE — Progress Notes (Signed)
Subjective: Patient feels a little better today. She still is 7 out of 10 and has been able to walk around without a problem. She denied any nausea vomiting or diarrhea. She still on the Dilaudid PCA with a basal rate. She has used 44.25 mg in the last 24 hours. She has also been under Toradol still. We have stopped the physician assisted dosing. She is worried about the weather today and the fact that her daughter is at home with family.  Objective: Vital signs in last 24 hours: Temp:  [97.9 F (36.6 C)-99 F (37.2 C)] 99 F (37.2 C) (01/23 0523) Pulse Rate:  [72-84] 72 (01/23 0523) Resp:  [10-19] 16 (01/23 0750) BP: (101-109)/(50-54) 106/53 mmHg (01/23 0523) SpO2:  [95 %-100 %] 100 % (01/23 0750) Weight change:  Last BM Date: 03/21/14  Intake/Output from previous day: 01/22 0701 - 01/23 0700 In: 240 [P.O.:240] Out: 1 [Urine:1] Intake/Output this shift:    General appearance: alert, cooperative, appears stated age and no distress Eyes: conjunctivae/corneas clear. PERRL, EOM's intact. Fundi benign. Ears: normal TM's and external ear canals both ears Nose: Nares normal. Septum midline. Mucosa normal. No drainage or sinus tenderness. Neck: no adenopathy, no carotid bruit, no JVD, supple, symmetrical, trachea midline and thyroid not enlarged, symmetric, no tenderness/mass/nodules Back: symmetric, no curvature. ROM normal. No CVA tenderness. Resp: clear to auscultation bilaterally Chest wall: no tenderness Cardio: regular rate and rhythm, S1, S2 normal, no murmur, click, rub or gallop GI: soft, non-tender; bowel sounds normal; no masses,  no organomegaly Extremities: extremities normal, atraumatic, no cyanosis or edema Pulses: 2+ and symmetric Skin: Skin color, texture, turgor normal. No rashes or lesions Neurologic: Grossly normal  Lab Results:  Recent Labs  03/23/14 1135 03/24/14 0420  WBC 9.4 10.2  HGB 7.5* 7.0*  HCT 21.1* 19.8*  PLT 221 227   BMET  Recent Labs  03/23/14 1135 03/24/14 0420  NA 139 139  K 3.4* 3.7  CL 114* 115*  CO2 21 19  GLUCOSE 128* 131*  BUN 6 8  CREATININE 0.45* 0.48*  CALCIUM 8.3* 8.2*    Studies/Results: No results found.  Medications: I have reviewed the patient's current medications.  Assessment/Plan: A 29 year old female admitted with sickle cell painful crisis.  #1 sickle cell painful crisis: Patient will be maintained on the Dilaudid PCA. Will stop the basal dose and continue with oral medications. We will start preparing patient for discharge home.  #2 sickle cell anemia: Hemoglobin has dropped to 7.0 but is still within her range. Could also be due to hemodilution. I will DC her IV fluids. Continue to monitor her hemoglobin  #3 leukocytosis: Resolved   #4 pericardial effusion: No further workup required  #5 dehydration: Seems hydrated. We'll DC IV fluid.  #6 hypokalemia: Repleted  LOS: 6 days   GARBA,LAWAL 03/24/2014, 12:00 PM

## 2014-03-24 NOTE — Progress Notes (Signed)
Pt complaining of pain 10/10.  Encouraged to use PCA pump.  MD notified.  Will continue to monitor closely.

## 2014-03-25 LAB — CBC WITH DIFFERENTIAL/PLATELET
Basophils Absolute: 0.1 10*3/uL (ref 0.0–0.1)
Basophils Relative: 1 % (ref 0–1)
Eosinophils Absolute: 0.4 10*3/uL (ref 0.0–0.7)
Eosinophils Relative: 3 % (ref 0–5)
HCT: 20 % — ABNORMAL LOW (ref 36.0–46.0)
HEMOGLOBIN: 7 g/dL — AB (ref 12.0–15.0)
Lymphocytes Relative: 29 % (ref 12–46)
Lymphs Abs: 3.3 10*3/uL (ref 0.7–4.0)
MCH: 35 pg — ABNORMAL HIGH (ref 26.0–34.0)
MCHC: 35 g/dL (ref 30.0–36.0)
MCV: 100 fL (ref 78.0–100.0)
MONO ABS: 1.4 10*3/uL — AB (ref 0.1–1.0)
Monocytes Relative: 13 % — ABNORMAL HIGH (ref 3–12)
NEUTROS ABS: 6.2 10*3/uL (ref 1.7–7.7)
Neutrophils Relative %: 54 % (ref 43–77)
PLATELETS: 273 10*3/uL (ref 150–400)
RBC: 2 MIL/uL — ABNORMAL LOW (ref 3.87–5.11)
RDW: 16.9 % — AB (ref 11.5–15.5)
WBC: 11.3 10*3/uL — ABNORMAL HIGH (ref 4.0–10.5)

## 2014-03-25 LAB — COMPREHENSIVE METABOLIC PANEL
ALBUMIN: 3.4 g/dL — AB (ref 3.5–5.2)
ALT: 32 U/L (ref 0–35)
ANION GAP: 4 — AB (ref 5–15)
AST: 57 U/L — ABNORMAL HIGH (ref 0–37)
Alkaline Phosphatase: 86 U/L (ref 39–117)
BUN: 5 mg/dL — AB (ref 6–23)
CO2: 19 mmol/L (ref 19–32)
CREATININE: 0.43 mg/dL — AB (ref 0.50–1.10)
Calcium: 8.5 mg/dL (ref 8.4–10.5)
Chloride: 115 mmol/L — ABNORMAL HIGH (ref 96–112)
GFR calc non Af Amer: >90 mL/min (ref >90)
Glucose, Bld: 137 mg/dL — ABNORMAL HIGH (ref 70–99)
Potassium: 3.7 mmol/L (ref 3.5–5.1)
SODIUM: 138 mmol/L (ref 135–145)
Total Bilirubin: 1.8 mg/dL — ABNORMAL HIGH (ref 0.3–1.2)
Total Protein: 6.4 g/dL (ref 6.0–8.3)

## 2014-03-25 MED ORDER — HYDROXYUREA 500 MG PO CAPS
500.0000 mg | ORAL_CAPSULE | Freq: Every day | ORAL | Status: DC
  Administered 2014-03-26 – 2014-03-27 (×2): 500 mg via ORAL
  Filled 2014-03-25 (×3): qty 1

## 2014-03-25 MED ORDER — OXYCODONE-ACETAMINOPHEN 5-325 MG PO TABS
1.0000 | ORAL_TABLET | Freq: Four times a day (QID) | ORAL | Status: DC | PRN
  Administered 2014-03-25: 1 via ORAL
  Filled 2014-03-25: qty 1

## 2014-03-25 MED ORDER — HYDROMORPHONE HCL 2 MG/ML IJ SOLN
2.0000 mg | INTRAMUSCULAR | Status: DC | PRN
  Administered 2014-03-25 – 2014-03-27 (×16): 2 mg via INTRAVENOUS
  Filled 2014-03-25 (×15): qty 1

## 2014-03-25 MED ORDER — OXYCODONE-ACETAMINOPHEN 10-325 MG PO TABS: 1.0000 | ORAL_TABLET | Freq: Four times a day (QID) | ORAL | Status: DC | PRN

## 2014-03-25 MED ORDER — FOLIC ACID 1 MG PO TABS: 1.0000 mg | ORAL_TABLET | Freq: Every day | ORAL | Status: DC

## 2014-03-25 MED ORDER — OXYCODONE HCL 5 MG PO TABS
5.0000 mg | ORAL_TABLET | Freq: Four times a day (QID) | ORAL | Status: DC | PRN
Start: 2014-03-25 — End: 2014-03-27
  Administered 2014-03-25: 5 mg via ORAL
  Filled 2014-03-25: qty 1

## 2014-03-25 MED ORDER — HYDROMORPHONE HCL 2 MG/ML IJ SOLN
2.0000 mg | INTRAMUSCULAR | Status: DC | PRN
Start: 2014-03-25 — End: 2014-03-25
  Administered 2014-03-25 (×3): 2 mg via INTRAVENOUS
  Filled 2014-03-25 (×3): qty 1

## 2014-03-25 NOTE — Progress Notes (Signed)
Spoke with the provider. Patient improving. MD spoke with the patient earlier today about her concerns for pain medication. No new orders received. Will continue to follow the POC.

## 2014-03-25 NOTE — Progress Notes (Signed)
Subjective: Patient is in tears complaining of pain at 10 out of 10. Her basal rate was discontinued yesterday and also her physician assisted dosing was discontinued 2 days ago. Pain is said to be in her chest. Mainly constant no radiation. She is in tears because of the severe pain. She has not felt better since yesterday. She initially thought she was getting better but not today. No cough, no shortness of breath, no nausea vomiting or diarrhea. She is still on the Dilaudid PCA and has used 29 mg with 102 demands and 58 deliveries in the last 24 hours. Objective: Vital signs in last 24 hours: Temp:  [98.9 F (37.2 C)-99.2 F (37.3 C)] 98.9 F (37.2 C) (01/24 0500) Pulse Rate:  [79-86] 86 (01/24 0500) Resp:  [14-23] 20 (01/24 0800) BP: (99-101)/(51-53) 99/53 mmHg (01/24 0500) SpO2:  [98 %-100 %] 100 % (01/24 0800) Weight change:  Last BM Date: 03/21/14  Intake/Output from previous day: 01/23 0701 - 01/24 0700 In: 2461.3 [P.O.:440; I.V.:2021.3] Out: 1 [Urine:1] Intake/Output this shift:    General appearance: alert, cooperative, appears stated age and no distress Eyes: conjunctivae/corneas clear. PERRL, EOM's intact. Fundi benign. Ears: normal TM's and external ear canals both ears Nose: Nares normal. Septum midline. Mucosa normal. No drainage or sinus tenderness. Neck: no adenopathy, no carotid bruit, no JVD, supple, symmetrical, trachea midline and thyroid not enlarged, symmetric, no tenderness/mass/nodules Back: symmetric, no curvature. ROM normal. No CVA tenderness. Resp: clear to auscultation bilaterally Chest wall: no tenderness Cardio: regular rate and rhythm, S1, S2 normal, no murmur, click, rub or gallop GI: soft, non-tender; bowel sounds normal; no masses,  no organomegaly Extremities: extremities normal, atraumatic, no cyanosis or edema Pulses: 2+ and symmetric Skin: Skin color, texture, turgor normal. No rashes or lesions Neurologic: Grossly normal  Lab  Results:  Recent Labs  03/24/14 0420 03/25/14 0650  WBC 10.2 11.3*  HGB 7.0* 7.0*  HCT 19.8* 20.0*  PLT 227 273   BMET  Recent Labs  03/24/14 0420 03/25/14 0650  NA 139 138  K 3.7 3.7  CL 115* 115*  CO2 19 19  GLUCOSE 131* 137*  BUN 8 5*  CREATININE 0.48* 0.43*  CALCIUM 8.2* 8.5    Studies/Results: No results found.  Medications: I have reviewed the patient's current medications.  Assessment/Plan: A 29 year old female admitted with sickle cell painful crisis.  #1 sickle cell painful crisis: Patient seems to be getting worse today. We may have to rapidly decrease her dose from yesterday. I will restart the physician assisted dosing at 2 mg every 3 hours for today. Also start her oral MSIR to compliment the treatment. We will review her case later in the evening to see if she is improving.  #2 sickle cell anemia: Hemoglobin has remained stable. No evidence of a drop.  #3 leukocytosis: Slight increase in her white count today. We'll continue to monitor.   #4 pericardial effusion: Evaluated and no further treatment required.  #5 dehydration: Hydrated.  #6 hypokalemia: Repleted  LOS: 7 days   GARBA,LAWAL 03/25/2014, 10:48 AM

## 2014-03-26 LAB — COMPREHENSIVE METABOLIC PANEL
ALK PHOS: 87 U/L (ref 39–117)
ALT: 34 U/L (ref 0–35)
AST: 57 U/L — ABNORMAL HIGH (ref 0–37)
Albumin: 3.7 g/dL (ref 3.5–5.2)
Anion gap: 5 (ref 5–15)
BUN: 6 mg/dL (ref 6–23)
CALCIUM: 8.4 mg/dL (ref 8.4–10.5)
CO2: 19 mmol/L (ref 19–32)
Chloride: 112 mmol/L (ref 96–112)
Creatinine, Ser: 0.48 mg/dL — ABNORMAL LOW (ref 0.50–1.10)
GFR calc Af Amer: >90 mL/min (ref >90)
Glucose, Bld: 139 mg/dL — ABNORMAL HIGH (ref 70–99)
Potassium: 3.5 mmol/L (ref 3.5–5.1)
Sodium: 136 mmol/L (ref 135–145)
Total Bilirubin: 1.7 mg/dL — ABNORMAL HIGH (ref 0.3–1.2)
Total Protein: 6.6 g/dL (ref 6.0–8.3)

## 2014-03-26 LAB — CBC WITH DIFFERENTIAL/PLATELET
Basophils Absolute: 0.1 10*3/uL (ref 0.0–0.1)
Basophils Relative: 1 % (ref 0–1)
EOS ABS: 0.3 10*3/uL (ref 0.0–0.7)
Eosinophils Relative: 2 % (ref 0–5)
HCT: 19.8 % — ABNORMAL LOW (ref 36.0–46.0)
HEMOGLOBIN: 7 g/dL — AB (ref 12.0–15.0)
Lymphocytes Relative: 29 % (ref 12–46)
Lymphs Abs: 3.5 10*3/uL (ref 0.7–4.0)
MCH: 35 pg — AB (ref 26.0–34.0)
MCHC: 35.4 g/dL (ref 30.0–36.0)
MCV: 99 fL (ref 78.0–100.0)
MONOS PCT: 12 % (ref 3–12)
Monocytes Absolute: 1.4 10*3/uL — ABNORMAL HIGH (ref 0.1–1.0)
NEUTROS ABS: 6.8 10*3/uL (ref 1.7–7.7)
NEUTROS PCT: 56 % (ref 43–77)
PLATELETS: 263 10*3/uL (ref 150–400)
RBC: 2 MIL/uL — AB (ref 3.87–5.11)
RDW: 17.3 % — AB (ref 11.5–15.5)
WBC: 12 10*3/uL — AB (ref 4.0–10.5)

## 2014-03-26 NOTE — Progress Notes (Signed)
Subjective: Patient is complaining that her pain is unchanged in the last 24 hours. She has used 29.5 mg of the dilaudid with 79 demands and 58 deliveries. Patient is asking for continuous Dilaudid dosing. Her numbers have not changed from yesterday with no evidence of active hemolysis. Her problem is mainly pain. She was previously on basal dosing with physician assisted dosing. Those were gradually discontinued. At this point she is on 2 mg of Dilaudid every 3 hours physician assisted dosing. No shortness of breath, no cough, patient's N down in bed with no signs of distress as seen 2 days ago. Objective: Vital signs in last 24 hours: Temp:  [98.7 F (37.1 C)-99.4 F (37.4 C)] 99.4 F (37.4 C) (01/25 0707) Pulse Rate:  [84-97] 97 (01/25 0707) Resp:  [12-18] 16 (01/25 0800) BP: (94-102)/(49-59) 94/49 mmHg (01/25 0707) SpO2:  [100 %] 100 % (01/25 0800) Weight change:  Last BM Date: 03/21/14  Intake/Output from previous day: 01/24 0701 - 01/25 0700 In: 1483.8 [P.O.:360; I.V.:1123.8] Out: 3 [Urine:3] Intake/Output this shift: Total I/O In: 240 [P.O.:240] Out: -   General appearance: alert, cooperative, appears stated age and no distress Eyes: conjunctivae/corneas clear. PERRL, EOM's intact. Fundi benign. Ears: normal TM's and external ear canals both ears Nose: Nares normal. Septum midline. Mucosa normal. No drainage or sinus tenderness. Neck: no adenopathy, no carotid bruit, no JVD, supple, symmetrical, trachea midline and thyroid not enlarged, symmetric, no tenderness/mass/nodules Back: symmetric, no curvature. ROM normal. No CVA tenderness. Resp: clear to auscultation bilaterally Chest wall: no tenderness Cardio: regular rate and rhythm, S1, S2 normal, no murmur, click, rub or gallop GI: soft, non-tender; bowel sounds normal; no masses,  no organomegaly Extremities: extremities normal, atraumatic, no cyanosis or edema Pulses: 2+ and symmetric Skin: Skin color, texture, turgor  normal. No rashes or lesions Neurologic: Grossly normal  Lab Results:  Recent Labs  03/25/14 0650 03/26/14 0625  WBC 11.3* 12.0*  HGB 7.0* 7.0*  HCT 20.0* 19.8*  PLT 273 263   BMET  Recent Labs  03/25/14 0650 03/26/14 0625  NA 138 136  K 3.7 3.5  CL 115* 112  CO2 19 19  GLUCOSE 137* 139*  BUN 5* 6  CREATININE 0.43* 0.48*  CALCIUM 8.5 8.4    Studies/Results: No results found.  Medications: I have reviewed the patient's current medications.  Assessment/Plan: A 29 year old female admitted with sickle cell painful crisis.  #1 sickle cell painful crisis: Patient is asking for more pain medicine. I've discussed with the patient that clinically speaking base no objective reason for me to put her on a basal Dilaudid PCA. She can still use more of the Dilaudid PCA push. And I'm restarting her oral medications in addition to leaving the physician assisted dosing for another 24 hours. I have encouraged her to get out of bed and walk around.  #2 sickle cell anemia: Hemoglobin has remained stable. We will continue to monitor  #3 leukocytosis: This is due to her sickle cell crisis and autosplenectomy. We'll continue to monitor.   #4 pericardial effusion: Evaluated and no further treatment required. May need follow-up as an outpatient.  #5 dehydration: Hydrated will DC IV fluids  #6 hypokalemia: Repleted  LOS: 8 days   GARBA,LAWAL 03/26/2014, 10:39 AM

## 2014-03-27 DIAGNOSIS — R000 Tachycardia, unspecified: Secondary | ICD-10-CM

## 2014-03-27 LAB — COMPREHENSIVE METABOLIC PANEL
ALT: 34 U/L (ref 0–35)
AST: 61 U/L — AB (ref 0–37)
Albumin: 3.4 g/dL — ABNORMAL LOW (ref 3.5–5.2)
Alkaline Phosphatase: 92 U/L (ref 39–117)
Anion gap: 6 (ref 5–15)
BILIRUBIN TOTAL: 2.7 mg/dL — AB (ref 0.3–1.2)
BUN: 8 mg/dL (ref 6–23)
CO2: 20 mmol/L (ref 19–32)
CREATININE: 0.48 mg/dL — AB (ref 0.50–1.10)
Calcium: 8.6 mg/dL (ref 8.4–10.5)
Chloride: 111 mmol/L (ref 96–112)
GLUCOSE: 111 mg/dL — AB (ref 70–99)
Potassium: 3.7 mmol/L (ref 3.5–5.1)
Sodium: 137 mmol/L (ref 135–145)
Total Protein: 6.7 g/dL (ref 6.0–8.3)

## 2014-03-27 LAB — CBC WITH DIFFERENTIAL/PLATELET
BASOS ABS: 0 10*3/uL (ref 0.0–0.1)
BASOS PCT: 0 % (ref 0–1)
EOS PCT: 2 % (ref 0–5)
Eosinophils Absolute: 0.3 10*3/uL (ref 0.0–0.7)
HCT: 17.7 % — ABNORMAL LOW (ref 36.0–46.0)
HEMOGLOBIN: 6.4 g/dL — AB (ref 12.0–15.0)
LYMPHS ABS: 4.5 10*3/uL — AB (ref 0.7–4.0)
Lymphocytes Relative: 30 % (ref 12–46)
MCH: 35.2 pg — AB (ref 26.0–34.0)
MCHC: 36.2 g/dL — ABNORMAL HIGH (ref 30.0–36.0)
MCV: 97.3 fL (ref 78.0–100.0)
Monocytes Absolute: 2.1 10*3/uL — ABNORMAL HIGH (ref 0.1–1.0)
Monocytes Relative: 14 % — ABNORMAL HIGH (ref 3–12)
NEUTROS PCT: 54 % (ref 43–77)
Neutro Abs: 8.1 10*3/uL — ABNORMAL HIGH (ref 1.7–7.7)
Platelets: 285 10*3/uL (ref 150–400)
RBC: 1.82 MIL/uL — ABNORMAL LOW (ref 3.87–5.11)
RDW: 18.5 % — ABNORMAL HIGH (ref 11.5–15.5)
WBC: 15 10*3/uL — ABNORMAL HIGH (ref 4.0–10.5)

## 2014-03-27 LAB — TSH: TSH: 3.559 u[IU]/mL (ref 0.350–4.500)

## 2014-03-27 MED ORDER — NAPROXEN 250 MG PO TABS
250.0000 mg | ORAL_TABLET | Freq: Two times a day (BID) | ORAL | Status: DC
  Administered 2014-03-27: 250 mg via ORAL
  Filled 2014-03-27 (×4): qty 1

## 2014-03-27 MED ORDER — OXYCODONE-ACETAMINOPHEN 5-325 MG PO TABS
1.0000 | ORAL_TABLET | ORAL | Status: DC
  Administered 2014-03-27 – 2014-03-28 (×5): 1 via ORAL
  Filled 2014-03-27 (×6): qty 1

## 2014-03-27 MED ORDER — HEPARIN SOD (PORK) LOCK FLUSH 100 UNIT/ML IV SOLN: 500.0000 [IU] | Freq: Once | INTRAVENOUS | Status: DC

## 2014-03-27 MED ORDER — OXYCODONE HCL 5 MG PO TABS
5.0000 mg | ORAL_TABLET | ORAL | Status: DC
  Administered 2014-03-27 – 2014-03-28 (×5): 5 mg via ORAL
  Filled 2014-03-27 (×6): qty 1

## 2014-03-27 NOTE — Care Management Note (Signed)
CARE MANAGEMENT NOTE 03/27/2014  Patient:  Brittany CampbellYNE,Brittany   Account Number:  [id number]  Date Initiated:  03/19/2014  Documentation initiated by:  Lanier ClamMAHABIR,KATHY  Subjective/Objective Assessment:   29 y/o f admitted w/ssc.     Action/Plan:   From home.   Anticipated DC Date:  03/28/2014   Anticipated DC Plan:  HOME/SELF CARE      DC Planning Services  CM consult      Choice offered to / List presented to:             Status of service:  In process, will continue to follow Medicare Important Message given?   (If response is "NO", the following Medicare IM given date fields will be blank) Date Medicare IM given:   Medicare IM given by:   Date Additional Medicare IM given:   Additional Medicare IM given by:    Discharge Disposition:    Per UR Regulation:  Reviewed for med. necessity/level of care/duration of stay  If discussed at Long Length of Stay Meetings, dates discussed:    Comments:  03/27/14 Sandford Crazeora Westin Knotts RN,BSN,NCM [phone] Was asked by Dr Ashley RoyaltyMatthews to have pt evaluated for LTAC. Since pt has Medicaid this would be a charity case with [facility].  Dr. Jacky KindleAronson called to see if pt qualifies for LTAC under charity. Per Dr. Jacky KindleAronson pt does not have the clinical intensity to qualify for LTAC. Dr. Ashley RoyaltyMatthews made aware.  03/22/14 Lanier ClamKathy Mahabir RN BSN NCM [phone] ivf,dilaudid iv pca.No anticipated d/c needs.  03/19/14 Lanier ClamKathy Mahabir RN BSN NCM [phone] Monitor progress for d/c needs.No anticipated d/c needs.

## 2014-03-27 NOTE — Progress Notes (Signed)
CRITICAL VALUE ALERT  Critical value received:  HGB 6.4  Date of notification:  03/27/14  Time of notification:  0627  Critical value read back:yes  Nurse who received alert:  Alessandra BevelsJennifer M. MD notified (1st page):  Craige CottaKirby   Time of first page:  0645 MD notified (2nd page): n/a  Time of second page: n/a  Responding MD: Have not heard back yet. Craige CottaKirby was sent detailed message via page.  MD can address during rounds since not significant drop from previous value.    Time MD responded: n/a

## 2014-03-27 NOTE — Progress Notes (Signed)
Patient arrived to floor around 0230am. Assessment and condition WNL.  When asked about why her PAC was out of date patient reported that she would not let previous RN's reaccess Port.  This RN again asked patient if I could reaccess her PAC and patient refused.  Patient teaching reinforced. Will continue to monitor site. Kenton KingfisherMills, [place] Severt [place]

## 2014-03-27 NOTE — Progress Notes (Signed)
SICKLE CELL SERVICE PROGRESS NOTE  Brittany CampbellMiranda Foster ZOX:[id number] DOB: 08/01/1985 DOA: 03/18/2014 PCP: [name],[name], MD  Assessment/Plan: Active Problems:   Sickle cell pain crisis   Leukocytosis   Hypokalemia   Pericardial effusion  1. Hb SS with crisis; Pt has used 30 mg with 81/61: demands/deliveries. She still rates her pain as 8/10 and reports that it is localized to her chest wall. Pt has been receiving high doses of IV Opiates over the last 8 days in addition to recent exposure to high dose Opiates only 1 week before at another institution. She is ambulatory without assistance and I will schedule her oral analgesics- Oxycodone-APAP 10-325 mg every 4 hours and continue the PCA as a PRN medication. I plan to discharge tomorrow barring any increase in medical risk.    2. Leukocytosis: No evidence of infection. Attribute to crisis and inflammation. Start Naproxen 250 mg q 12 hours 3. Anemia of chronic disease: Stable at baseline. 4. Sickle Cell Disease: Pt is on Hydrea and tolerating it well. Her MCV is consistent with compliance of medication. Continue folic acid. 5. Tachycardia: EKG shows sinus tachycardia. Will check TSH. She had a CT angiogram which was negative for PE. 6. Small Peri-cardial effusion: clinically insignificant. Appreciate input from Dr. [place]. 7. Risk of Infection: Pt refused to have her port -needle changed at the appropriate time. I have explained to the patient that this is a safety measure and that refusal of this places her at increased risk of complication. Pt now agreeable to needle change today.  Code Status: Full Code Family Communication: N/A Disposition Plan: Anticipate Discharge Home Tomorrow  [name],[name]  Pager [phone]. If 7PM-7AM, please contact night-coverage.  03/27/2014, 2:28 PM  LOS: 9 days   Consultants:  None  Procedures:  None  Antibiotics:  None  HPI/Subjective: She still rates her pain as 10/10 and reports that it is  localized to her chest wall.However patient is functional and able to ambulate without difficulty. She became very boisterous when advised that discharge was anticipated tomorrow. I offered consideration of LTAC if she felt that she could not manage at home and she refused. Objective: Filed Vitals:   03/27/14 0400 03/27/14 0851 03/27/14 1315 03/27/14 1413  BP:    103/57  Pulse:    97  Temp:    99.1 F (37.3 C)  TempSrc:    Oral  Resp: 19 17 17 17   Height:      Weight:      SpO2: 94% 97% 95% 98%   Weight change:   Intake/Output Summary (Last 24 hours) at 03/27/14 1428 Last data filed at 03/27/14 1413  Gross per 24 hour  Intake 3131.5 ml  Output      1 ml  Net 3130.5 ml    General: Alert, awake, oriented x3, in no acute distress. Very Pleasant initially. HEENT: [place]/AT PEERL, EOMI, anicteric Neck: Trachea midline,  no masses, no thyromegaly, no JVD, no carotid bruit OROPHARYNX:  Moist, No exudate/ erythema/lesions.  Heart: Regular rate and rhythm, without murmurs, rubs, gallops, PMI non-displaced, no heaves or thrills on palpation.  Lungs: Clear to auscultation, no wheezing or rhonchi noted. No increased vocal fremitus resonant to percussion  Abdomen: Soft, nontender, nondistended, positive bowel sounds, no masses no hepatosplenomegaly noted.  Neuro: No focal neurological deficits noted cranial nerves II through XII grossly intact. Strength normal in bilateral upper and lower extremities. Musculoskeletal: No warm swelling or erythema around joints, no spinal tenderness noted. Psychiatric: Patient alert and oriented  x3, good insight and cognition, good recent to remote recall. Lymph node survey: No cervical axillary or inguinal lymphadenopathy noted.   Data Reviewed: Basic Metabolic Panel:  Recent Labs Lab 03/23/14 1135 03/24/14 0420 03/25/14 0650 03/26/14 0625 03/27/14 0550  NA 139 139 138 136 137  K 3.4* 3.7 3.7 3.5 3.7  CL 114* 115* 115* 112 111  CO2 GLUCOSE 128* 131* 137* 139* 111*  BUN 6 8 5* 6 8  CREATININE 0.45* 0.48* 0.43* 0.48* 0.48*  CALCIUM 8.3* 8.2* 8.5 8.4 8.6   Liver Function Tests:  Recent Labs Lab 03/23/14 1135 03/24/14 0420 03/25/14 0650 03/26/14 0625 03/27/14 0550  AST 53* 57* 57* 57* 61*  ALT 30 31 32 34 34  ALKPHOS 86 81 86 87 92  BILITOT 2.0* 2.0* 1.8* 1.7* 2.7*  PROT 6.1 6.0 6.4 6.6 6.7  ALBUMIN 3.3* 3.2* 3.4* 3.7 3.4*   No results for input(s): LIPASE, AMYLASE in the last 168 hours. No results for input(s): AMMONIA in the last 168 hours. CBC:  Recent Labs Lab 03/23/14 1135 03/24/14 0420 03/25/14 0650 03/26/14 0625 03/27/14 0550  WBC 9.4 10.2 11.3* 12.0* 15.0*  NEUTROABS 4.7 4.6 6.2 6.8 8.1*  HGB 7.5* 7.0* 7.0* 7.0* 6.4*  HCT 21.1* 19.8* 20.0* 19.8* 17.7*  MCV 100.0 100.0 100.0 99.0 97.3  PLT 221 227 273 263 285   Cardiac Enzymes: No results for input(s): CKTOTAL, CKMB, CKMBINDEX, TROPONINI in the last 168 hours. BNP (last 3 results) No results for input(s): PROBNP in the last 8760 hours. CBG: No results for input(s): GLUCAP in the last 168 hours.  No results found for this or any previous visit (from the past 240 hour(s)).   Studies: Ct Angio Chest Pe W/cm &/or Wo Cm  03/18/2014   CLINICAL DATA:  Chest pain. Shortness of breath. Sickle cell disease.  EXAM: CT ANGIOGRAPHY CHEST WITH CONTRAST  TECHNIQUE: Multidetector CT imaging of the chest was performed using the standard protocol during bolus administration of intravenous contrast. Multiplanar CT image reconstructions and MIPs were obtained to evaluate the vascular anatomy.  CONTRAST:  OMNIPAQUE IOHEXOL 350 MG/ML SOLN  COMPARISON:  Multiple exams, including 02/09/2014 and 03/18/2014  FINDINGS: No filling defect is identified in the pulmonary arterial tree to suggest pulmonary embolus. No acute aortic findings. Stably prominent thymus. Pericardial effusion inferiorly.  Small type 1 hiatal hernia.  Trace right pleural effusion.  No  pathologic thoracic adenopathy. Subsegmental atelectasis anteriorly in the right lower lobe.  4.1 cm catheter fragment is stuck in The brachiocephalic vein.  Mild periportal edema in the upper abdomen. Small hyperdense spleen. Sclerosis in the sternum and in portions of the sixth and eighth thoracic vertebra are likely a manifestation of sickle cell disease.  Review of the MIP images confirms the above findings.  IMPRESSION: 1. No pulmonary embolus or acute aortic findings. 2. Small to moderate pericardial effusion inferiorly. This could be further assessed with echocardiography if clinically warranted, particularly if the patient has jugular venous distension or other signs of tamponade. 3. Trace right pleural effusion. Subsegmental atelectasis anterolaterally in the right lower lobe. 4. Small type 1 hiatal hernia. 5. Mild periportal edema. 6. Manifestations of sickle cell disease include a small hyperdense spleen, thymic hyperplasia, and osseous manifestations.   Electronically Signed   By: Herbie Baltimore M.D.   On: 03/18/2014 20:22   Dg Chest Port 1 View  03/18/2014   CLINICAL DATA:  Chest pain. Weakness. Sickle cell disease.  Shortness of breath.  EXAM: PORTABLE CHEST - 1 VIEW  COMPARISON:  02/09/2014 CT scan  FINDINGS: Upper normal heart size. Indistinct pulmonary vasculature. Right-sided Port-A-Cath tip: Right atrium. Catheter fragment is present in the brachiocephalic vein, a chronic finding.  No airspace opacity identified.  IMPRESSION: 1. Mildly prominent upper zone pulmonary vasculature may be incidental given the semi-erect nature of today's exam, but could reflect pulmonary venous hypertension. Upper normal heart size. 2. Stable chronic catheter fragment in the brachia cephalic vein. 3. No airspace opacity observed.   Electronically Signed   By: Herbie Baltimore M.D.   On: 03/18/2014 17:45    Scheduled Meds: . folic acid  1 mg Oral Daily  . gabapentin  300 mg Oral TID  . heparin  5,000 Units  Subcutaneous 3 times per day  . HYDROmorphone PCA 2 mg/mL   Intravenous 6 times per day  . hydroxyurea  500 mg Oral Daily  . oxyCODONE-acetaminophen  1 tablet Oral Q4H   And  . oxyCODONE  5 mg Oral Q4H  . senna-docusate  1 tablet Oral BID  . sodium chloride  3 mL Intravenous Q12H  . topiramate  50 mg Oral BID   Continuous Infusions: . dextrose 5 % and 0.45% NaCl 75 mL/hr at 03/27/14 0554    Total time 40 minutes.

## 2014-03-28 LAB — CBC WITH DIFFERENTIAL/PLATELET
Basophils Absolute: 0.1 10*3/uL (ref 0.0–0.1)
Basophils Relative: 1 % (ref 0–1)
EOS ABS: 0.5 10*3/uL (ref 0.0–0.7)
Eosinophils Relative: 4 % (ref 0–5)
HCT: 16.8 % — ABNORMAL LOW (ref 36.0–46.0)
Hemoglobin: 6.2 g/dL — CL (ref 12.0–15.0)
Lymphocytes Relative: 30 % (ref 12–46)
Lymphs Abs: 3.7 10*3/uL (ref 0.7–4.0)
MCH: 35.8 pg — AB (ref 26.0–34.0)
MCHC: 36.9 g/dL — ABNORMAL HIGH (ref 30.0–36.0)
MCV: 97.1 fL (ref 78.0–100.0)
Monocytes Absolute: 1.8 10*3/uL — ABNORMAL HIGH (ref 0.1–1.0)
Monocytes Relative: 15 % — ABNORMAL HIGH (ref 3–12)
Neutro Abs: 6.4 10*3/uL (ref 1.7–7.7)
Neutrophils Relative %: 51 % (ref 43–77)
Platelets: 302 10*3/uL (ref 150–400)
RBC: 1.73 MIL/uL — ABNORMAL LOW (ref 3.87–5.11)
RDW: 19.6 % — AB (ref 11.5–15.5)
WBC: 12.4 10*3/uL — AB (ref 4.0–10.5)

## 2014-03-28 LAB — BASIC METABOLIC PANEL
Anion gap: 4 — ABNORMAL LOW (ref 5–15)
BUN: 6 mg/dL (ref 6–23)
CHLORIDE: 110 mmol/L (ref 96–112)
CO2: 19 mmol/L (ref 19–32)
CREATININE: 0.4 mg/dL — AB (ref 0.50–1.10)
Calcium: 8.3 mg/dL — ABNORMAL LOW (ref 8.4–10.5)
Glucose, Bld: 115 mg/dL — ABNORMAL HIGH (ref 70–99)
Potassium: 3.6 mmol/L (ref 3.5–5.1)
SODIUM: 133 mmol/L — AB (ref 135–145)

## 2014-03-28 LAB — LACTATE DEHYDROGENASE: LDH: 298 U/L — AB (ref 94–250)

## 2014-03-28 MED ORDER — SODIUM CHLORIDE 0.9 % IV SOLN
INTRAVENOUS | Status: DC
  Administered 2014-03-28: 10:00:00 via INTRAVENOUS

## 2014-03-28 MED ORDER — HEPARIN SOD (PORK) LOCK FLUSH 100 UNIT/ML IV SOLN
500.0000 [IU] | INTRAVENOUS | Status: AC | PRN
  Administered 2014-03-28: 500 [IU]
  Filled 2014-03-28: qty 5

## 2014-03-28 NOTE — Discharge Summary (Signed)
Brittany Foster MRN: [id number] DOB/AGE: 1986/02/11 29 y.o.  Admit date: 03/18/2014 Discharge date: 03/28/2014  Primary Care Physician:  Vedansh Kerstetter A., MD   Discharge Diagnoses:   Patient Active Problem List   Diagnosis Date Noted  . Pericardial effusion   . Anemia 02/09/2014  . Hypokalemia 02/09/2014  . Neuropathy 02/09/2014  . Chronic headache disorder 02/09/2014  . Leukocytosis 01/29/2014  . Pain in the chest   . Abnormal CT scan, chest   . Sickle cell anemia with pain 12/30/2013  . Chest pain 12/30/2013  . Sickle cell pain crisis 12/30/2013    DISCHARGE MEDICATION:   Medication List    TAKE these medications        ALEVE 220 MG Caps  Generic drug:  Naproxen Sodium  Take 660 mg by mouth every 6 (six) hours as needed (pain).     folic acid 1 MG tablet  Commonly known as:  FOLVITE  Take 1 tablet (1 mg total) by mouth daily.     gabapentin 300 MG capsule  Commonly known as:  NEURONTIN  Take 1 capsule (300 mg total) by mouth 3 (three) times daily.     hydroxyurea 500 MG capsule  Commonly known as:  HYDREA  Take 1 capsule (500 mg total) by mouth daily. May take with food to minimize GI side effects.     oxyCODONE-acetaminophen 10-325 MG per tablet  Commonly known as:  PERCOCET  Take 1 tablet by mouth every 6 (six) hours as needed for pain.     senna-docusate 8.6-50 MG per tablet  Commonly known as:  Senokot-S  Take 1 tablet by mouth 2 (two) times daily.     Topiramate ER 100 MG Cp24  Take 100 mg by mouth at bedtime.          Consults:     SIGNIFICANT DIAGNOSTIC STUDIES:  Ct Angio Chest Pe W/cm &/or Wo Cm  03/18/2014   CLINICAL DATA:  Chest pain. Shortness of breath. Sickle cell disease.  EXAM: CT ANGIOGRAPHY CHEST WITH CONTRAST  TECHNIQUE: Multidetector CT imaging of the chest was performed using the standard protocol during bolus administration of intravenous contrast. Multiplanar CT image reconstructions and MIPs were obtained to evaluate the  vascular anatomy.  CONTRAST:  OMNIPAQUE IOHEXOL 350 MG/ML SOLN  COMPARISON:  Multiple exams, including 02/09/2014 and 03/18/2014  FINDINGS: No filling defect is identified in the pulmonary arterial tree to suggest pulmonary embolus. No acute aortic findings. Stably prominent thymus. Pericardial effusion inferiorly.  Small type 1 hiatal hernia.  Trace right pleural effusion.  No pathologic thoracic adenopathy. Subsegmental atelectasis anteriorly in the right lower lobe.  4.1 cm catheter fragment is stuck in The brachiocephalic vein.  Mild periportal edema in the upper abdomen. Small hyperdense spleen. Sclerosis in the sternum and in portions of the sixth and eighth thoracic vertebra are likely a manifestation of sickle cell disease.  Review of the MIP images confirms the above findings.  IMPRESSION: 1. No pulmonary embolus or acute aortic findings. 2. Small to moderate pericardial effusion inferiorly. This could be further assessed with echocardiography if clinically warranted, particularly if the patient has jugular venous distension or other signs of tamponade. 3. Trace right pleural effusion. Subsegmental atelectasis anterolaterally in the right lower lobe. 4. Small type 1 hiatal hernia. 5. Mild periportal edema. 6. Manifestations of sickle cell disease include a small hyperdense spleen, thymic hyperplasia, and osseous manifestations.   Electronically Signed   By: Herbie Baltimore M.D.   On: 03/18/2014 20:22  Dg Chest Port 1 View  03/18/2014   CLINICAL DATA:  Chest pain. Weakness. Sickle cell disease. Shortness of breath.  EXAM: PORTABLE CHEST - 1 VIEW  COMPARISON:  02/09/2014 CT scan  FINDINGS: Upper normal heart size. Indistinct pulmonary vasculature. Right-sided Port-A-Cath tip: Right atrium. Catheter fragment is present in the brachiocephalic vein, a chronic finding.  No airspace opacity identified.  IMPRESSION: 1. Mildly prominent upper zone pulmonary vasculature may be incidental given the  semi-erect nature of today's exam, but could reflect pulmonary venous hypertension. Upper normal heart size. 2. Stable chronic catheter fragment in the brachia cephalic vein. 3. No airspace opacity observed.   Electronically Signed   By: Herbie BaltimoreWalt  Liebkemann M.D.   On: 03/18/2014 17:45     ECHO: Left ventricle: The cavity size was normal. Wall thickness was normal. Systolic function was normal. The estimated ejection fraction was in the range of 55% to 60%. Wall motion was normal; there were no regional wall motion abnormalities. Left ventricular diastolic function parameters were normal. - Pericardium, extracardiac: A trivial pericardial effusion was identified posterior to the heart.     No results found for this or any previous visit (from the past 240 hour(s)).  BRIEF ADMITTING H & P This is a 29 y.o. year old female with significant past medical history of sickle cell disease, medical noncompliance presenting with sickle cell pain, pericardial effusion. Pt reports pain over past 3-4 days. Has been noncompliant w/ hydroxyurea. Reports diffuse pain which is her usual distribution of pain including chest pain. However, has also developed central chest pain and SOB. No fevers or chills. Nonsmoker. Denies any recent overexertion. States that she ran out of her hydroxyurea 1-2 weeks ago. States that she rarely takes it because it causes flares at times.  Presents to ER T 98.7, HR 70s-90s, resp 10s, BP 110s, satting 100% on 2L [name]. WBC 12.3, hgb 9.2, K 2.8, Cr 0.39. Retic count 16. Trop neg x1. BNP 70s. EKG NSR. CT Angio obtained that was negative for PE but did show small to moderate pericardial effusion inferiorly, trace right pleural effusion, mild periportal edema. Has received 6 mg IV dilaudid in ER w/ minimal to mild improvement in sxs.    Hospital Course:  Present on Admission:  . Sickle cell pain crisis: Pt was managed with IV Toradol x 5 days, IVf and IV Dilaudid via PCA and then  transitioned to oral analgesics. On admission her pain was rated at 10/10 and she demonstrated  and continued to rate her pain at 10/10 despite normal function. At the time of discharge she had minimal use of the PCA and was able to ambulate without difficulty even refusing the W/C escort to the lobby stating that she could walk by herself.  No prescriptions for anlagesics were given at discharge as per review of NCCS[facility], patient received a total of 90 tabs between 1/8 and 1/12 and was admitted to the hospital on 1/17. She should have 8-9 days supply of tablets remaining. . Hypokalemia: Pt had a mild hypokalemia which was replaced orally. Her potassium was normal at the time of discharge.  . Pericardial Effusion: Pt c/o chest pain at the time of admission. She was evaluated and r/o for pulmonary embolus. During the evaluation, the CT of the chest revealed a pericardial effusion. A TTE was performed which showed trivial pericardial effusion and cardiology who saw the patient in consultation felt that it had no clinical significance. It was concluded that the chest pain was secondary  to sickle cell crisis. . Leukocytosis: It was felt to be secondary to sickle cell crisis.SHe had no clinical evidence of infection. . Tachycardia: Pt had a sinus tachycardia (asymptomatic) She was evaluated for PE which was negative and her TSH was also found to be normal. This will be followed as an out patient.   Disposition and Follow-up:  Pt discharged in stable condition. She is to follow up with the clinic in 1 week.     Discharge Instructions    Activity as tolerated - No restrictions    Complete by:  As directed      Diet general    Complete by:  As directed            DISCHARGE EXAM:  General: Alert, awake, oriented x3, in no acute distress.  Vital Signs: BP 91/49, HR 86, T 97.6 F (36.4 C), temperature source Oral, RR 13, height 5' (1.524 m), weight 119 lb 6.4 oz (54.159 kg), last menstrual period  03/04/2014, SpO2 97 %. HEENT: [place]/AT PEERL, EOMI, anicteric Neck: Trachea midline, no masses, no thyromegal,y no JVD, no carotid bruit OROPHARYNX: Moist, No exudate/ erythema/lesions.  Heart: Regular rate and rhythm, without murmurs, rubs, gallops, PMI non-displaced.  Lungs: Clear to auscultation, no wheezing or rhonchi noted.  Abdomen: Soft, nontender, nondistended, positive bowel sounds, no masses no hepatosplenomegaly noted.  Neuro: No focal neurological deficits noted cranial nerves II through XII grossly intact.  Strength at baseline in bilateral upper and lower extremities. Musculoskeletal: No warm swelling or erythema around joints, no spinal tenderness noted. Psychiatric: Patient alert and oriented x3, good insight and cognition, good recent to remote recall. Lymph node survey: No cervical axillary or inguinal lymphadenopathy noted.    Recent Labs  03/27/14 0550 03/28/14 0725  NA 137 133*  K 3.7 3.6  CL 111 110  CO2 20 19  GLUCOSE 111* 115*  BUN 8 6  CREATININE 0.48* 0.40*  CALCIUM 8.6 8.3*    Recent Labs  03/26/14 0625 03/27/14 0550  AST 57* 61*  ALT 34 34  ALKPHOS 87 92  BILITOT 1.7* 2.7*  PROT 6.6 6.7  ALBUMIN 3.7 3.4*   No results for input(s): LIPASE, AMYLASE in the last 72 hours.  Recent Labs  03/27/14 0550 03/28/14 0725  WBC 15.0* 12.4*  NEUTROABS 8.1* 6.4  HGB 6.4* 6.2*  HCT 17.7* 16.8*  MCV 97.3 97.1  PLT 285 302   Total time spent including face to face and decision making was greater than 30 minutes Signed: Taleen Prosser A. 03/28/2014, 2:24 PM

## 2014-03-28 NOTE — Progress Notes (Signed)
Wasted 7cc of high concentration dilaudid PCA in sink. Witnessed by Katina DegreeKimberly Osborne, RN

## 2014-03-28 NOTE — Progress Notes (Signed)
Nursing Discharge Summary  Patient ID: Brittany CampbellMiranda Foster MRN: [id number] DOB/AGE: 30/03/1985 30 y.o.  Admit date: 03/18/2014 Discharge date: 03/28/2014  Discharged Condition: good  Disposition: 01-Home or Self Care  Follow-up Information    Follow up with [name],[name], MD In 3 days.   Specialty:  Internal Medicine   Why:  As needed, If symptoms worsen   Contact information:   [address] [address] [place] [address] [phone]       Prescriptions Given: None given. Follow up appointments and medications discussed.  Means of Discharge: Patient ambulated downstairs to be discharged home.   Signed: Gloriajean DellBaldwin, Drayton Tieu Danielle 03/28/2014, 1:06 PM

## 2014-04-03 ENCOUNTER — Telehealth: Payer: Self-pay | Admitting: Neurology

## 2014-04-03 NOTE — Telephone Encounter (Signed)
Called pt and LMOM at 9AM.

## 2014-04-03 NOTE — Telephone Encounter (Signed)
Pt cancelled NP appt via automated reminder system. I have cancelled appt in EPIC.   Sue Lushndrea - please call pt to confirm we have cancelled appt and to offer reschedule if desired. If pt declines r/s, please notify referring provider's office / Sherri S.

## 2014-04-05 ENCOUNTER — Ambulatory Visit: Payer: Medicaid Other | Admitting: Neurology

## 2014-04-06 ENCOUNTER — Encounter (HOSPITAL_COMMUNITY): Payer: Self-pay | Admitting: *Deleted

## 2014-04-06 ENCOUNTER — Inpatient Hospital Stay (HOSPITAL_COMMUNITY)
Admission: EM | Admit: 2014-04-06 | Discharge: 2014-04-10 | DRG: 812 | Disposition: A | Discharge status: Home / Self Care | Payer: Medicaid Other | Attending: Internal Medicine | Admitting: Internal Medicine

## 2014-04-06 ENCOUNTER — Emergency Department (HOSPITAL_COMMUNITY): Payer: Medicaid Other

## 2014-04-06 DIAGNOSIS — Z87891 Personal history of nicotine dependence: Secondary | ICD-10-CM | POA: Diagnosis not present

## 2014-04-06 DIAGNOSIS — R079 Chest pain, unspecified: Secondary | ICD-10-CM

## 2014-04-06 DIAGNOSIS — G894 Chronic pain syndrome: Secondary | ICD-10-CM | POA: Diagnosis present

## 2014-04-06 DIAGNOSIS — I313 Pericardial effusion (noninflammatory): Secondary | ICD-10-CM | POA: Diagnosis present

## 2014-04-06 DIAGNOSIS — D571 Sickle-cell disease without crisis: Principal | ICD-10-CM | POA: Diagnosis present

## 2014-04-06 DIAGNOSIS — R4586 Emotional lability: Secondary | ICD-10-CM | POA: Diagnosis present

## 2014-04-06 DIAGNOSIS — D5700 Hb-SS disease with crisis, unspecified: Secondary | ICD-10-CM | POA: Diagnosis present

## 2014-04-06 LAB — RAPID URINE DRUG SCREEN, HOSP PERFORMED
Amphetamines: NOT DETECTED
BENZODIAZEPINES: NOT DETECTED
Barbiturates: NOT DETECTED
Cocaine: NOT DETECTED
OPIATES: POSITIVE — AB
Tetrahydrocannabinol: NOT DETECTED

## 2014-04-06 LAB — BASIC METABOLIC PANEL
Anion gap: 6 (ref 5–15)
BUN: 6 mg/dL (ref 6–23)
CALCIUM: 8.8 mg/dL (ref 8.4–10.5)
CO2: 24 mmol/L (ref 19–32)
CREATININE: 0.35 mg/dL — AB (ref 0.50–1.10)
Chloride: 109 mmol/L (ref 96–112)
Glucose, Bld: 136 mg/dL — ABNORMAL HIGH (ref 70–99)
POTASSIUM: 3.5 mmol/L (ref 3.5–5.1)
SODIUM: 139 mmol/L (ref 135–145)

## 2014-04-06 LAB — URINALYSIS, ROUTINE W REFLEX MICROSCOPIC
Bilirubin Urine: NEGATIVE
Glucose, UA: NEGATIVE mg/dL
Hgb urine dipstick: NEGATIVE
KETONES UR: NEGATIVE mg/dL
Nitrite: NEGATIVE
PH: 7.5 (ref 5.0–8.0)
Protein, ur: NEGATIVE mg/dL
Specific Gravity, Urine: 1.011 (ref 1.005–1.030)
UROBILINOGEN UA: 1 mg/dL (ref 0.0–1.0)

## 2014-04-06 LAB — CBC
HEMATOCRIT: 27.8 % — AB (ref 36.0–46.0)
Hemoglobin: 9.3 g/dL — ABNORMAL LOW (ref 12.0–15.0)
MCH: 33.6 pg (ref 26.0–34.0)
MCHC: 33.5 g/dL (ref 30.0–36.0)
MCV: 100.4 fL — AB (ref 78.0–100.0)
PLATELETS: 353 10*3/uL (ref 150–400)
RBC: 2.77 MIL/uL — AB (ref 3.87–5.11)
RDW: 17.9 % — AB (ref 11.5–15.5)
WBC: 9.3 10*3/uL (ref 4.0–10.5)

## 2014-04-06 LAB — I-STAT TROPONIN, ED: TROPONIN I, POC: 0 ng/mL (ref 0.00–0.08)

## 2014-04-06 LAB — PREGNANCY, URINE: Preg Test, Ur: NEGATIVE

## 2014-04-06 LAB — URINE MICROSCOPIC-ADD ON

## 2014-04-06 LAB — RETICULOCYTES
RBC.: 2.77 MIL/uL — ABNORMAL LOW (ref 3.87–5.11)
Retic Count, Absolute: 182.8 10*3/uL (ref 19.0–186.0)
Retic Ct Pct: 6.6 % — ABNORMAL HIGH (ref 0.4–3.1)

## 2014-04-06 MED ORDER — HYDROMORPHONE HCL 2 MG/ML IJ SOLN
2.0000 mg | Freq: Once | INTRAMUSCULAR | Status: AC
  Administered 2014-04-06: 2 mg via INTRAVENOUS
  Filled 2014-04-06: qty 1

## 2014-04-06 MED ORDER — NALOXONE HCL 0.4 MG/ML IJ SOLN: 0.4000 mg | INTRAMUSCULAR | Status: DC | PRN

## 2014-04-06 MED ORDER — NAPROXEN 500 MG PO TABS
500.0000 mg | ORAL_TABLET | Freq: Four times a day (QID) | ORAL | Status: DC | PRN
  Filled 2014-04-06: qty 1

## 2014-04-06 MED ORDER — SENNOSIDES-DOCUSATE SODIUM 8.6-50 MG PO TABS
1.0000 | ORAL_TABLET | Freq: Two times a day (BID) | ORAL | Status: DC
  Administered 2014-04-08 – 2014-04-09 (×3): 1 via ORAL
  Filled 2014-04-06 (×9): qty 1

## 2014-04-06 MED ORDER — DIPHENHYDRAMINE HCL 12.5 MG/5ML PO ELIX: 12.5000 mg | ORAL_SOLUTION | Freq: Four times a day (QID) | ORAL | Status: DC | PRN

## 2014-04-06 MED ORDER — POLYETHYLENE GLYCOL 3350 17 G PO PACK
17.0000 g | PACK | Freq: Every day | ORAL | Status: DC | PRN
Start: 2014-04-06 — End: 2014-04-10
  Filled 2014-04-06: qty 1

## 2014-04-06 MED ORDER — NAPROXEN SODIUM 220 MG PO CAPS: 660.0000 mg | ORAL_CAPSULE | Freq: Four times a day (QID) | ORAL | Status: DC | PRN

## 2014-04-06 MED ORDER — GABAPENTIN 300 MG PO CAPS
300.0000 mg | ORAL_CAPSULE | Freq: Three times a day (TID) | ORAL | Status: DC
  Administered 2014-04-06 – 2014-04-10 (×11): 300 mg via ORAL
  Filled 2014-04-06 (×13): qty 1

## 2014-04-06 MED ORDER — TOPIRAMATE 25 MG PO TABS
50.0000 mg | ORAL_TABLET | Freq: Two times a day (BID) | ORAL | Status: DC
  Administered 2014-04-06 – 2014-04-10 (×8): 50 mg via ORAL
  Filled 2014-04-06 (×9): qty 2

## 2014-04-06 MED ORDER — SODIUM CHLORIDE 0.9 % IJ SOLN: 9.0000 mL | INTRAMUSCULAR | Status: DC | PRN

## 2014-04-06 MED ORDER — HYDROXYUREA 500 MG PO CAPS
500.0000 mg | ORAL_CAPSULE | Freq: Every day | ORAL | Status: DC
  Administered 2014-04-07 – 2014-04-10 (×4): 500 mg via ORAL
  Filled 2014-04-06 (×4): qty 1

## 2014-04-06 MED ORDER — HYDROMORPHONE HCL 2 MG/ML IJ SOLN
2.0000 mg | INTRAMUSCULAR | Status: AC
  Administered 2014-04-06 (×3): 2 mg via INTRAVENOUS
  Filled 2014-04-06 (×3): qty 1

## 2014-04-06 MED ORDER — TOPIRAMATE ER 100 MG PO CAP24: 100.0000 mg | ORAL_CAPSULE | Freq: Every day | ORAL | Status: DC

## 2014-04-06 MED ORDER — ONDANSETRON HCL 4 MG/2ML IJ SOLN: 4.0000 mg | Freq: Four times a day (QID) | INTRAMUSCULAR | Status: DC | PRN

## 2014-04-06 MED ORDER — HYDROMORPHONE 2 MG/ML HIGH CONCENTRATION IV PCA SOLN
INTRAVENOUS | Status: DC
  Administered 2014-04-06: 22:00:00 via INTRAVENOUS
  Administered 2014-04-07: 5 mg via INTRAVENOUS
  Administered 2014-04-07: 8 mg via INTRAVENOUS
  Administered 2014-04-07: 5.5 mg via INTRAVENOUS
  Administered 2014-04-07: 5 mg via INTRAVENOUS
  Filled 2014-04-06 (×2): qty 25

## 2014-04-06 MED ORDER — HEPARIN SODIUM (PORCINE) 5000 UNIT/ML IJ SOLN
5000.0000 [IU] | Freq: Three times a day (TID) | INTRAMUSCULAR | Status: DC
  Filled 2014-04-06 (×14): qty 1

## 2014-04-06 MED ORDER — SODIUM CHLORIDE 0.9 % IV SOLN
12.5000 mg | Freq: Four times a day (QID) | INTRAVENOUS | Status: DC | PRN
  Administered 2014-04-06 – 2014-04-10 (×12): 12.5 mg via INTRAVENOUS
  Filled 2014-04-06 (×25): qty 0.25

## 2014-04-06 MED ORDER — KETOROLAC TROMETHAMINE 30 MG/ML IJ SOLN
30.0000 mg | Freq: Once | INTRAMUSCULAR | Status: AC
  Administered 2014-04-06: 30 mg via INTRAVENOUS
  Filled 2014-04-06: qty 1

## 2014-04-06 MED ORDER — SODIUM CHLORIDE 0.9 % IV SOLN
INTRAVENOUS | Status: DC
  Administered 2014-04-06 – 2014-04-08 (×2): via INTRAVENOUS

## 2014-04-06 MED ORDER — FOLIC ACID 1 MG PO TABS
1.0000 mg | ORAL_TABLET | Freq: Every day | ORAL | Status: DC
  Administered 2014-04-06 – 2014-04-10 (×5): 1 mg via ORAL
  Filled 2014-04-06 (×5): qty 1

## 2014-04-06 NOTE — ED Provider Notes (Signed)
Patient care transferred to me by Trixie DredgeEmily West, PA-C. Patient here with pain crisis typical to her normal pain crisis distribution. No concern for acute chest or other emergent pathology at this time. Plan is aggressive pain management, fluid rehydration and discharged to follow-up with PCP if no new objective findings. If pain not managed in ED, will consider admission to medicine for pain management. Filed Vitals:   04/06/14 1517 04/06/14 1708 04/06/14 1811 04/06/14 1839  BP: 104/68 107/59 106/52 102/52  Pulse: 94 81 82 77  Temp:      TempSrc:      Resp: 16 14 20 15   Height:      Weight:      SpO2: 100% 98% 97% 100%   Meds given in ED:  Medications  HYDROmorphone (DILAUDID) injection 2 mg (2 mg Intravenous Given 04/06/14 1837)  HYDROmorphone (DILAUDID) injection 2 mg (2 mg Intravenous Given 04/06/14 1644)  ketorolac (TORADOL) 30 MG/ML injection 30 mg (30 mg Intravenous Given 04/06/14 1644)  HYDROmorphone (DILAUDID) injection 2 mg (2 mg Intravenous Given 04/06/14 1712)    New Prescriptions   No medications on file  VSS. Does not appear to be distressed. Labs at baseline or improving.CXR stable. No evidence of Acute Chest Syndrome. Recheck 17:30, pt still 10/10 pain, despite x3 dilaudid 2mg , pt maintains 10/10 pain. Will consult IM for pain management Pt admitted   Sharlene MottsBenjamin W Nyles Mitton, PA-C 04/06/14 [address]1922  Marlaysia Lenig W Edenbornartner, [place] JerseyPA-C 04/06/14 1922  Arby BarretteMarcy Pfeiffer, MD 04/08/14 0000

## 2014-04-06 NOTE — ED Provider Notes (Signed)
CSN: [id number]     Arrival date & time 04/06/14  1306 History   First MD Initiated Contact with Patient 04/06/14 1415     Chief Complaint  Patient presents with  . Chest Pain     (Consider location/radiation/quality/duration/timing/severity/associated sxs/prior Treatment) HPI   Patient with hx sickle cell disease p/w chest pain and SOB that began yesterday.  Pain is located across entire front of her chest, has been constant.  Began while she was "not doing anything."  Associated chills, N/V.  Pt thinks N/V due to pain.  Was admitted 03/18/14 - 03/28/14 with sickle cell crisis with chest pain and new pericardial effusion evaluated by cardiology to be insignificant.  Is taking all of her medications.  Denies cough, abdominal pain.     Past Medical History  Diagnosis Date  . Sickle cell anemia    Past Surgical History  Procedure Laterality Date  . Tubal ligation    . Cholecystectomy    . Cesarean section    . Port a cath placement Right     about 6-7 years ago  . Cholecystectomy  2000   Family History  Problem Relation Age of Onset  . Sickle cell anemia Other   . Sickle cell trait Father   . Sickle cell trait Mother    History  Substance Use Topics  . Smoking status: Former Games developermoker  . Smokeless tobacco: Not on file     Comment: smokes once every couple of weeks.   . Alcohol Use: No   OB History    No data available     Review of Systems  All other systems reviewed and are negative.     Allergies  Ultram and Morphine and related  Home Medications   Prior to Admission medications   Medication Sig Start Date End Date Taking? Authorizing Provider  folic acid (FOLVITE) 1 MG tablet Take 1 tablet (1 mg total) by mouth daily. 02/16/14  Yes Massie MaroonLachina M [name], FNP  gabapentin (NEURONTIN) 300 MG capsule Take 1 capsule (300 mg total) by mouth 3 (three) times daily. 02/14/14  Yes Altha HarmMichelle A [name], MD  hydroxyurea (HYDREA) 500 MG capsule Take 1 capsule (500 mg total) by  mouth daily. May take with food to minimize GI side effects. 02/14/14  Yes Altha HarmMichelle A [name], MD  medroxyPROGESTERone (DEPO-PROVERA) 150 MG/ML injection Inject 150 mg into the muscle every 3 (three) months. 11/02/13  Yes Historical Provider, MD  Naproxen Sodium (ALEVE) 220 MG CAPS Take 660 mg by mouth every 6 (six) hours as needed (pain).   Yes Historical Provider, MD  oxyCODONE-acetaminophen (PERCOCET) 10-325 MG per tablet Take 1 tablet by mouth every 6 (six) hours as needed for pain. 03/09/14  Yes Massie MaroonLachina M [name], FNP  Topiramate ER 100 MG CP24 Take 100 mg by mouth at bedtime.   Yes Historical Provider, MD  senna-docusate (SENOKOT-S) 8.6-50 MG per tablet Take 1 tablet by mouth 2 (two) times daily. Patient not taking: Reported on 03/18/2014 02/14/14   Altha HarmMichelle A [name], MD   BP 116/65 mmHg  Pulse 79  Temp(Src) 98.1 F (36.7 C) (Oral)  Resp 18  Ht 5' (1.524 m)  Wt 112 lb (50.803 kg)  BMI 21.87 kg/m2  SpO2 100%  LMP 03/04/2014 Physical Exam  Constitutional: She appears well-developed and well-nourished. No distress.  HENT:  Head: Normocephalic and atraumatic.  Neck: Neck supple.  Cardiovascular: Normal rate and regular rhythm.   Pulmonary/Chest: Effort normal and breath sounds normal. No respiratory distress. She has  no wheezes. She has no rales. She exhibits no tenderness.  Abdominal: Soft. She exhibits no distension. There is no tenderness. There is no rebound and no guarding.  Neurological: She is alert.  Skin: She is not diaphoretic.  Nursing note and vitals reviewed.   ED Course  Procedures (including critical care time) Labs Review Labs Reviewed  CBC - Abnormal; Notable for the following:    RBC 2.77 (*)    Hemoglobin 9.3 (*)    HCT 27.8 (*)    MCV 100.4 (*)    RDW 17.9 (*)    All other components within normal limits  RETICULOCYTES - Abnormal; Notable for the following:    Retic Ct Pct 6.6 (*)    RBC. 2.77 (*)    All other components within normal limits  BASIC  METABOLIC PANEL  URINE RAPID DRUG SCREEN (HOSP PERFORMED)  URINALYSIS, ROUTINE W REFLEX MICROSCOPIC  PREGNANCY, URINE  I-STAT TROPOININ, ED    Imaging Review Dg Chest 2 View  04/06/2014   CLINICAL DATA:  Chest pain extending to left shoulder. Shortness of breath. Sickle cell disease.  EXAM: CHEST - 2 VIEW  COMPARISON:  CTA chest 03/18/2014.  One-view chest 03/18/2014.  FINDINGS: The heart is mildly enlarged. Mild interstitial coarsening is stable. A right IJ Port-A-Cath is stable in position. The tip of the catheter is in the right atrium, stable. The catheter fragment in the innominate vein is stable. No focal airspace disease is present. Vertebral endplate changes are compatible with sickle cell disease.  IMPRESSION: 1. Stable cardiomegaly and chronic interstitial coarsening. 2. No superimposed acute cardiopulmonary disease. 3. The tip of the right IJ Port-A-Cath is in the right atrium.   Electronically Signed   By: Gennette Pac M.D.   On: 04/06/2014 14:55     EKG Interpretation None        Date: 04/06/2014  Rate: 82  Rhythm: normal sinus rhythm  QRS Axis: normal  Intervals: normal  ST/T Wave abnormalities: nonspecific T wave changes  Conduction Disutrbances:none  Narrative Interpretation:   Old EKG Reviewed: unchanged    MDM   Final diagnoses:  Sickle cell anemia with pain  Chest pain, unspecified chest pain type    Afebrile nontoxic patient with sickle cell disease, constant chest pain with SOB since yesterday.  Recent CT angio chest that was negative for PE but showed small pericardial effusion - admitted to the hospital, thought to be insignificant by cardiology.  D/C home with feeling well for approximately one week and developed chest pain with SOB again yesterday.  The pain has been constant, across anterior chest.  CXR negative, EKG unremarkable.  Labs pending at change of shift.  Signed out to [employer], PA-C, who will follow labs, attempt pain control, and reassess.       Trixie Dredge, PA-C 04/06/14 1555  Purvis Sheffield, MD 04/06/14 [phone]

## 2014-04-06 NOTE — H&P (Signed)
[facility] History and Physical  Lindy Garczynski ZOX:[id number] DOB: August 26, 1985 DOA: 04/06/2014  Referring physician: EDP PCP: [name],[name], MD   Chief Complaint: Sickle cell pain   HPI: Brittany Foster is a 29 y.o. female with h/o HGB-SS disease, frequent crisis admissions over the past couple of weeks.  Not compliant with hydroxyurea.  Patient returns to ED with uncontrolled sickle cell pain.  Pain is in her usual distribution including chest pain.  No fever nor chills.  Review of Systems: Systems reviewed.  As above, otherwise negative  Past Medical History  Diagnosis Date  . Sickle cell anemia    Past Surgical History  Procedure Laterality Date  . Tubal ligation    . Cholecystectomy    . Cesarean section    . Port a cath placement Right     about 6-7 years ago  . Cholecystectomy  2000   Social History:  reports that she has quit smoking. She does not have any smokeless tobacco history on file. She reports that she does not drink alcohol or use illicit drugs.  Allergies  Allergen Reactions  . Ultram [Tramadol] Other (See Comments)    seizures  . Morphine And Related Hives, Rash and Other (See Comments)    shaking    Family History  Problem Relation Age of Onset  . Sickle cell anemia Other   . Sickle cell trait Father   . Sickle cell trait Mother      Prior to Admission medications   Medication Sig Start Date End Date Taking? Authorizing Provider  folic acid (FOLVITE) 1 MG tablet Take 1 tablet (1 mg total) by mouth daily. 02/16/14  Yes [name], FNP  gabapentin (NEURONTIN) 300 MG capsule Take 1 capsule (300 mg total) by mouth 3 (three) times daily. 02/14/14  Yes Altha Harm, MD  hydroxyurea (HYDREA) 500 MG capsule Take 1 capsule (500 mg total) by mouth daily. May take with food to minimize GI side effects. 02/14/14  Yes Altha Harm, MD  Naproxen Sodium (ALEVE) 220 MG CAPS Take 660 mg by mouth every 6 (six) hours as needed (pain).    Yes Historical Provider, MD  oxyCODONE-acetaminophen (PERCOCET) 10-325 MG per tablet Take 1 tablet by mouth every 6 (six) hours as needed for pain. 03/09/14  Yes [name], FNP  Topiramate ER 100 MG CP24 Take 100 mg by mouth at bedtime.   Yes Historical Provider, MD   Physical Exam: Filed Vitals:   04/06/14 1937  BP: 102/54  Pulse: 73  Temp:   Resp: 16    BP 102/54 mmHg  Pulse 73  Temp(Src) 98.1 F (36.7 C) (Oral)  Resp 16  Ht 5' (1.524 m)  Wt 50.803 kg (112 lb)  BMI 21.87 kg/m2  SpO2 97%  LMP 03/04/2014  General Appearance:    Alert, oriented, no distress, appears stated age  Head:    Normocephalic, atraumatic  Eyes:    PERRL, EOMI, sclera non-icteric        Nose:   Nares without drainage or epistaxis. Mucosa, turbinates normal  Throat:   Moist mucous membranes. Oropharynx without erythema or exudate.  Neck:   Supple. No carotid bruits.  No thyromegaly.  No lymphadenopathy.   Back:     No CVA tenderness, no spinal tenderness  Lungs:     Clear to auscultation bilaterally, without wheezes, rhonchi or rales  Chest wall:    No tenderness to palpitation  Heart:    Regular rate and rhythm without  murmurs, gallops, rubs  Abdomen:     Soft, non-tender, nondistended, normal bowel sounds, no organomegaly  Genitalia:    deferred  Rectal:    deferred  Extremities:   No clubbing, cyanosis or edema.  Pulses:   2+ and symmetric all extremities  Skin:   Skin color, texture, turgor normal, no rashes or lesions  Lymph nodes:   Cervical, supraclavicular, and axillary nodes normal  Neurologic:   CNII-XII intact. Normal strength, sensation and reflexes      throughout    Labs on Admission:  Basic Metabolic Panel:  Recent Labs Lab 04/06/14 1512  NA 139  K 3.5  CL 109  CO2 24  GLUCOSE 136*  BUN 6  CREATININE 0.35*  CALCIUM 8.8   Liver Function Tests: No results for input(s): AST, ALT, ALKPHOS, BILITOT, PROT, ALBUMIN in the last 168 hours. No results for input(s): LIPASE,  AMYLASE in the last 168 hours. No results for input(s): AMMONIA in the last 168 hours. CBC:  Recent Labs Lab 04/06/14 1512  WBC 9.3  HGB 9.3*  HCT 27.8*  MCV 100.4*  PLT 353   Cardiac Enzymes: No results for input(s): CKTOTAL, CKMB, CKMBINDEX, TROPONINI in the last 168 hours.  BNP (last 3 results) No results for input(s): PROBNP in the last 8760 hours. CBG: No results for input(s): GLUCAP in the last 168 hours.  Radiological Exams on Admission: Dg Chest 2 View  04/06/2014   CLINICAL DATA:  Chest pain extending to left shoulder. Shortness of breath. Sickle cell disease.  EXAM: CHEST - 2 VIEW  COMPARISON:  CTA chest 03/18/2014.  One-view chest 03/18/2014.  FINDINGS: The heart is mildly enlarged. Mild interstitial coarsening is stable. A right IJ Port-A-Cath is stable in position. The tip of the catheter is in the right atrium, stable. The catheter fragment in the innominate vein is stable. No focal airspace disease is present. Vertebral endplate changes are compatible with sickle cell disease.  IMPRESSION: 1. Stable cardiomegaly and chronic interstitial coarsening. 2. No superimposed acute cardiopulmonary disease. 3. The tip of the right IJ Port-A-Cath is in the right atrium.   Electronically Signed   By: Gennette Pachris  Mattern M.D.   On: 04/06/2014 14:55    EKG: Independently reviewed.  Assessment/Plan Active Problems:   Sickle cell pain crisis   1. Sickle cell pain crisis - 1. Dilaudid PCA per her usual dosing 2. HGB above her baseline today at 9, defer transfusion. 3. CXR negative 4. Will continue to defer NSAIDs given a known pericardial effusion on last admission.    Code Status: Full Code  Family Communication: No family in room Disposition Plan: Admit to inpatient   Time spent: 50 min  GARDNER, JARED M. [facility] Pager [phone]  If 7AM-7PM, please contact the day team taking care of the patient [email or web address] Password [email or web address] 04/06/2014, 7:37 PM

## 2014-04-06 NOTE — ED Notes (Signed)
Pt states she can not void at this time 

## 2014-04-06 NOTE — ED Notes (Signed)
Pt alert and oriented x4. Respirations even and unlabored, bilateral symmetrical rise and fall of chest. Skin warm and dry. In no acute distress. Denies needs.   

## 2014-04-06 NOTE — Progress Notes (Addendum)
Attempted to call ED for report. Nurse not available at this time. Eugene GarnetSarah M Eldredge Veldhuizen RN, BSN

## 2014-04-06 NOTE — ED Notes (Signed)
Pt came into the ED today and  stated that she has been experiencing CP since yesterday but worse now radiating to left shoulder today causing SOB.

## 2014-04-06 NOTE — Progress Notes (Signed)
Pt has no fluids ordered. Paged Dr. Julian ReilGardner. No new orders received. Paged Craige CottaKirby NP, received call back saying after checking with Dr. Julian ReilGardner, confirmed no fluid orders other than KVO. Eugene GarnetSarah M Caralina Nop RN, BSN

## 2014-04-06 NOTE — ED Notes (Signed)
Nurse will get labs 

## 2014-04-07 DIAGNOSIS — D5700 Hb-SS disease with crisis, unspecified: Secondary | ICD-10-CM

## 2014-04-07 LAB — CBC WITH DIFFERENTIAL/PLATELET
BASOS ABS: 0.1 10*3/uL (ref 0.0–0.1)
BASOS PCT: 1 % (ref 0–1)
EOS ABS: 0.3 10*3/uL (ref 0.0–0.7)
Eosinophils Relative: 3 % (ref 0–5)
HCT: 23 % — ABNORMAL LOW (ref 36.0–46.0)
HEMOGLOBIN: 7.9 g/dL — AB (ref 12.0–15.0)
LYMPHS PCT: 41 % (ref 12–46)
Lymphs Abs: 4.4 10*3/uL — ABNORMAL HIGH (ref 0.7–4.0)
MCH: 34.6 pg — AB (ref 26.0–34.0)
MCHC: 34.3 g/dL (ref 30.0–36.0)
MCV: 100.9 fL — AB (ref 78.0–100.0)
Monocytes Absolute: 1.5 10*3/uL — ABNORMAL HIGH (ref 0.1–1.0)
Monocytes Relative: 15 % — ABNORMAL HIGH (ref 3–12)
NEUTROS PCT: 40 % — AB (ref 43–77)
Neutro Abs: 4.1 10*3/uL (ref 1.7–7.7)
PLATELETS: 298 10*3/uL (ref 150–400)
RBC: 2.28 MIL/uL — ABNORMAL LOW (ref 3.87–5.11)
RDW: 17.7 % — ABNORMAL HIGH (ref 11.5–15.5)
WBC: 10.3 10*3/uL (ref 4.0–10.5)

## 2014-04-07 MED ORDER — HYDROMORPHONE 2 MG/ML HIGH CONCENTRATION IV PCA SOLN
INTRAVENOUS | Status: DC
  Administered 2014-04-07: 7.5 mg via INTRAVENOUS
  Administered 2014-04-07: 22:00:00 via INTRAVENOUS
  Administered 2014-04-07: 8.5 mg via INTRAVENOUS
  Administered 2014-04-08: 11 mg via INTRAVENOUS
  Administered 2014-04-08: 7.5 mg via INTRAVENOUS
  Administered 2014-04-08: 3 mg via INTRAVENOUS
  Administered 2014-04-08: 9 mg via INTRAVENOUS
  Administered 2014-04-08: 7.5 mg via INTRAVENOUS
  Administered 2014-04-09: 12 mg via INTRAVENOUS
  Administered 2014-04-09: 9 mg via INTRAVENOUS
  Administered 2014-04-09: 10:00:00 via INTRAVENOUS
  Administered 2014-04-09: 1 mg via INTRAVENOUS
  Administered 2014-04-09: 5.5 mg via INTRAVENOUS
  Administered 2014-04-09: 2 mg via INTRAVENOUS
  Administered 2014-04-10: 9 mg via INTRAVENOUS
  Administered 2014-04-10: 5.5 mg via INTRAVENOUS
  Administered 2014-04-10: 3.5 mg via INTRAVENOUS
  Filled 2014-04-07 (×3): qty 25

## 2014-04-07 NOTE — Progress Notes (Signed)
Subjective: A 29 year old female with known history of sickle cell disease admitted with sickle cell painful crisis. Patient is on current Dilaudid PCA as well as Toradol. She is complaining of 10 out of 10 pain not being relieved. She however does not appear to be in distress. She has been resting comfortably. On further questioning patient apparently ran out of her pain medications at home as well as her Hydrea. She was unable to pick it up from the pharmacy since last discharge. She has used 26.7 mg the Dilaudid in the last 24 hours. No cough no fever no nausea vomiting or diarrhea.  Objective: Vital signs in last 24 hours: Temp:  [98.1 F (36.7 C)-98.6 F (37 C)] 98.4 F (36.9 C) (02/06 1020) Pulse Rate:  [60-94] 70 (02/06 1020) Resp:  [11-20] 11 (02/06 1133) BP: (94-116)/(52-68) 94/59 mmHg (02/06 1020) SpO2:  [95 %-100 %] 99 % (02/06 1133) Weight:  [50.803 kg (112 lb)-52.3 kg (115 lb 4.8 oz)] 52.3 kg (115 lb 4.8 oz) (02/05 2115) Weight change:  Last BM Date: 04/06/14  Intake/Output from previous day: 02/05 0701 - 02/06 0700 In: 560 [P.O.:480; I.V.:80] Out: 150 [Urine:150] Intake/Output this shift: Total I/O In: -  Out: 500 [Urine:500]  General appearance: alert, cooperative and no distress Eyes: conjunctivae/corneas clear. PERRL, EOM's intact. Fundi benign. Neck: no adenopathy, no carotid bruit, no JVD, supple, symmetrical, trachea midline and thyroid not enlarged, symmetric, no tenderness/mass/nodules Resp: clear to auscultation bilaterally Chest wall: no tenderness Cardio: regular rate and rhythm, S1, S2 normal, no murmur, click, rub or gallop GI: soft, non-tender; bowel sounds normal; no masses,  no organomegaly Extremities: extremities normal, atraumatic, no cyanosis or edema Pulses: 2+ and symmetric Skin: Skin color, texture, turgor normal. No rashes or lesions Neurologic: Grossly normal  Lab Results:  Recent Labs  04/06/14 1512 04/07/14 0405  WBC 9.3 10.3  HGB  9.3* 7.9*  HCT 27.8* 23.0*  PLT 353 298   BMET  Recent Labs  04/06/14 1512  NA 139  K 3.5  CL 109  CO2 24  GLUCOSE 136*  BUN 6  CREATININE 0.35*  CALCIUM 8.8    Studies/Results: Dg Chest 2 View  04/06/2014   CLINICAL DATA:  Chest pain extending to left shoulder. Shortness of breath. Sickle cell disease.  EXAM: CHEST - 2 VIEW  COMPARISON:  CTA chest 03/18/2014.  One-view chest 03/18/2014.  FINDINGS: The heart is mildly enlarged. Mild interstitial coarsening is stable. A right IJ Port-A-Cath is stable in position. The tip of the catheter is in the right atrium, stable. The catheter fragment in the innominate vein is stable. No focal airspace disease is present. Vertebral endplate changes are compatible with sickle cell disease.  IMPRESSION: 1. Stable cardiomegaly and chronic interstitial coarsening. 2. No superimposed acute cardiopulmonary disease. 3. The tip of the right IJ Port-A-Cath is in the right atrium.   Electronically Signed   By: Gennette Pachris  Mattern M.D.   On: 04/06/2014 14:55    Medications: I have reviewed the patient's current medications.  Assessment/Plan: A 29 year old female admitted with sickle cell painful crisis.  #1 sickle cell painful crisis: Most likely secondary to patient not taking her home medications. At this point she is on the Dilaudid PCA and I will keep her on the same setting. I will not change or add physician assisted dosing. We will work on making sure patient gets her medications at home at the time of discharge.   #2 sickle cell anemia: Hemoglobin has dropped slightly most  likely from hydration. Will monitor her very closely. No evidence of hemolysis so far. Next  #3 chronic pain issues: Patient is not compliant with treatment. She has received counseling on using her medications wisely.  LOS: 1 day   GARBA,LAWAL 04/07/2014, 12:27 PM

## 2014-04-07 NOTE — Progress Notes (Signed)
Clinical Social Work Department BRIEF PSYCHOSOCIAL ASSESSMENT 04/07/2014  Patient:  DEZIREA, MCCOLLISTER     Account Number:  [id number]     Admit date:  04/06/2014  Clinical Social Worker:  Jakari Jacot Inez Catalina  Date/Time:  04/07/2014 12:00 M  Referred by:    Date Referred:   Referred for  Transportation assistance   Other Referral:   Interview type:  Patient Other interview type:    PSYCHOSOCIAL DATA Living Status:  ALONE Admitted from facility:   Level of care:   Primary support name:  Sigmund Hazel Primary support relationship to patient:  PARENT Degree of support available:   moderate    CURRENT CONCERNS Current Concerns  Other - See comment   Other Concerns:   transportation    [place] / PLAN CSW met with pt at bedside to complete psychosocial. Pt shared she has difficulty getting transportation in crisis due to living in [place]. CSW and pt discussed utilitizing part bus, wsta, medicaid transportation, and [employer]. Patient and csw also discussed applying for the disabled rate. Pt thanked csw for concnern and support. CSW provided supportive counseling. Pt shared that she has difficulty in crisis but tries to utilize medicaid transportation for all of her appointments. Patient states that she has to pick and choose who to ask for help with trnasportaiton becasue everyone gets burnt out or they dont have enough gas to get to [place]. Pt states she plans to work on a ride home in advance but  may need assistance with cab voucher on the way home.   Assessment/plan status:  Psychosocial Support/Ongoing Assessment of Needs Other assessment/ plan:   Information/referral to community resources:   gta, part, wsta, Financial [employer], [employer]    PATIENT'S/FAMILY'S RESPONSE TO PLAN OF CARE: patient thanked csw for concern and support. Pt plans to follow up with part bus, and wsta for disabled rate. Patient appreciated the supportive counseling.        Clydean Posas A, 04/07/2014 4:54 PM

## 2014-04-08 LAB — COMPREHENSIVE METABOLIC PANEL
ALT: 45 U/L — AB (ref 0–35)
ANION GAP: 4 — AB (ref 5–15)
AST: 65 U/L — AB (ref 0–37)
Albumin: 3.8 g/dL (ref 3.5–5.2)
Alkaline Phosphatase: 90 U/L (ref 39–117)
BUN: 9 mg/dL (ref 6–23)
CALCIUM: 8.8 mg/dL (ref 8.4–10.5)
CHLORIDE: 111 mmol/L (ref 96–112)
CO2: 24 mmol/L (ref 19–32)
CREATININE: 0.43 mg/dL — AB (ref 0.50–1.10)
GFR calc non Af Amer: >90 mL/min (ref >90)
GLUCOSE: 103 mg/dL — AB (ref 70–99)
Potassium: 3.6 mmol/L (ref 3.5–5.1)
Sodium: 139 mmol/L (ref 135–145)
TOTAL PROTEIN: 7 g/dL (ref 6.0–8.3)
Total Bilirubin: 2.4 mg/dL — ABNORMAL HIGH (ref 0.3–1.2)

## 2014-04-08 LAB — CBC WITH DIFFERENTIAL/PLATELET
BASOS ABS: 0.1 10*3/uL (ref 0.0–0.1)
Basophils Relative: 1 % (ref 0–1)
Eosinophils Absolute: 0.3 10*3/uL (ref 0.0–0.7)
Eosinophils Relative: 2 % (ref 0–5)
HEMATOCRIT: 22.9 % — AB (ref 36.0–46.0)
Hemoglobin: 7.7 g/dL — ABNORMAL LOW (ref 12.0–15.0)
Lymphocytes Relative: 43 % (ref 12–46)
Lymphs Abs: 4.4 10*3/uL — ABNORMAL HIGH (ref 0.7–4.0)
MCH: 34.2 pg — ABNORMAL HIGH (ref 26.0–34.0)
MCHC: 33.6 g/dL (ref 30.0–36.0)
MCV: 101.8 fL — ABNORMAL HIGH (ref 78.0–100.0)
MONO ABS: 1.3 10*3/uL — AB (ref 0.1–1.0)
MONOS PCT: 13 % — AB (ref 3–12)
NEUTROS ABS: 4.3 10*3/uL (ref 1.7–7.7)
Neutrophils Relative %: 41 % — ABNORMAL LOW (ref 43–77)
PLATELETS: 287 10*3/uL (ref 150–400)
RBC: 2.25 MIL/uL — ABNORMAL LOW (ref 3.87–5.11)
RDW: 17.3 % — ABNORMAL HIGH (ref 11.5–15.5)
WBC: 10.4 10*3/uL (ref 4.0–10.5)

## 2014-04-08 NOTE — Progress Notes (Signed)
CARE MANAGEMENT NOTE 04/08/2014  Patient:  Brittany Foster,Brittany Foster   Account Number:  [id number]  Date Initiated:  04/07/2014  Documentation initiated by:  [facility],Orlan Aversa  Subjective/Objective Assessment:   uncontrolled sickle cell pain     Action/Plan:   from home   Anticipated DC Date:  04/09/2014   Anticipated DC Plan:  HOME/SELF CARE      DC Planning Services  CM consult  Medication Assistance      Choice offered to / List presented to:             Status of service:  Completed, signed off Medicare Important Message given?   (If response is "NO", the following Medicare IM given date fields will be blank) Date Medicare IM given:   Medicare IM given by:   Date Additional Medicare IM given:   Additional Medicare IM given by:    Discharge Disposition:  HOME/SELF CARE  Per UR Regulation:    If discussed at Long Length of Stay Meetings, dates discussed:    Comments:  04/08/2014 1330 NCM spoke to pt and she does have Medicaid. She cannot afford $3 copay cost. States she receives disability check but once she pays her bills she does not have any left over. She is a single parent of one child and [place]. States she has attempted to get a waiver of copay several times at pharmacy but they will not waive copay cost. NCM contacted pt's pharmacy, [facility] # [phone] while in the room and spoke to pharmacist, [place]. Explained pt is in hospital and plan to dc on Mon or Tues. States they will do a one time waiver of copay cost for meds except her Narcotics. Pt able to hear conversation. She will ask for waive of copay when she picks up meds.  Isidoro DonningAlesia Dailey Buccheri RN CCM Case Mgmt phone [phone]  04/07/2014 1030 Chart reviewed. CM referral for medication assistance. Isidoro DonningAlesia Blimy Napoleon RN CCM Case Mgmt phone [phone]

## 2014-04-08 NOTE — Progress Notes (Signed)
Subjective: Patient still complains of 9 out of 10 pain. She is not happy with her current regimen. She does not appear to be in severe distress this morning. She was actually sleeping soundly on my arrival. She however woke up and now wants more pain medicine. She is on the Dilaudid PCA and has used 36.5 mg of the Dilaudid with 79 demands and 73 deliveries in the last 24 hours. Patient has had multiple hospitalizations lately and is not clear what her home station is. She has not picked of her hydroxyurea but claimed to have her pain medicine.  Objective: Vital signs in last 24 hours: Temp:  [97.9 F (36.6 C)-98.5 F (36.9 C)] 98.3 F (36.8 C) (02/07 0603) Pulse Rate:  [68-72] 68 (02/07 0603) Resp:  [11-17] 16 (02/07 0603) BP: (89-97)/(52-60) 97/52 mmHg (02/07 0603) SpO2:  [98 %-100 %] 99 % (02/07 0603) FiO2 (%):  [21 %] 21 % (02/07 0400) Weight:  [53.7 kg (118 lb 6.2 oz)] 53.7 kg (118 lb 6.2 oz) (02/07 0603) Weight change: 2.897 kg (6 lb 6.2 oz) Last BM Date: 04/06/14  Intake/Output from previous day: 02/06 0701 - 02/07 0700 In: 480 [P.O.:480] Out: 1900 [Urine:1900] Intake/Output this shift:    General appearance: alert, cooperative and no distress Eyes: conjunctivae/corneas clear. PERRL, EOM's intact. Fundi benign. Neck: no adenopathy, no carotid bruit, no JVD, supple, symmetrical, trachea midline and thyroid not enlarged, symmetric, no tenderness/mass/nodules Resp: clear to auscultation bilaterally Chest wall: no tenderness Cardio: regular rate and rhythm, S1, S2 normal, no murmur, click, rub or gallop GI: soft, non-tender; bowel sounds normal; no masses,  no organomegaly Extremities: extremities normal, atraumatic, no cyanosis or edema Pulses: 2+ and symmetric Skin: Skin color, texture, turgor normal. No rashes or lesions Neurologic: Grossly normal  Lab Results:  Recent Labs  04/07/14 0405 04/08/14 0650  WBC 10.3 10.4  HGB 7.9* 7.7*  HCT 23.0* 22.9*  PLT 298 287    BMET  Recent Labs  04/06/14 1512 04/08/14 0650  NA 139 139  K 3.5 3.6  CL 109 111  CO2 24 24  GLUCOSE 136* 103*  BUN 6 9  CREATININE 0.35* 0.43*  CALCIUM 8.8 8.8    Studies/Results: Dg Chest 2 View  04/06/2014   CLINICAL DATA:  Chest pain extending to left shoulder. Shortness of breath. Sickle cell disease.  EXAM: CHEST - 2 VIEW  COMPARISON:  CTA chest 03/18/2014.  One-view chest 03/18/2014.  FINDINGS: The heart is mildly enlarged. Mild interstitial coarsening is stable. A right IJ Port-A-Cath is stable in position. The tip of the catheter is in the right atrium, stable. The catheter fragment in the innominate vein is stable. No focal airspace disease is present. Vertebral endplate changes are compatible with sickle cell disease.  IMPRESSION: 1. Stable cardiomegaly and chronic interstitial coarsening. 2. No superimposed acute cardiopulmonary disease. 3. The tip of the right IJ Port-A-Cath is in the right atrium.   Electronically Signed   By: Gennette Pachris  Mattern M.D.   On: 04/06/2014 14:55    Medications: I have reviewed the patient's current medications.  Assessment/Plan: A 29 year old female admitted with sickle cell painful crisis.  #1 sickle cell painful crisis: Patient's pain control is typically adequate. Her issue is more of a chronic pain issue it seems. Her numbers do not correlate with acute hemolytic crisis mainly painful crisis. I will keep her on current regimen for the next 24 hours but counseled her that she needs to think long-term regarding chronic pain management.   #  2 sickle cell anemia: Hemoglobin is stable today. It is at her baseline  #3 chronic pain issues: Continue pain control as an outpatient..  LOS: 2 days   GARBA,LAWAL 04/08/2014, 10:25 AM

## 2014-04-09 NOTE — Progress Notes (Signed)
Subjective: Patient still complains of 9 out of 10 pain. She is on the Dilaudid PCA. She used 37 mg of the Dilaudid with 138 demands and 67 deliveries. She is lying down no evidence of active hemolysis but patient insists she is using her PCA but not getting relief. No fever, no SOB, no cough. Objective: Vital signs in last 24 hours: Temp:  [98.1 F (36.7 C)-98.8 F (37.1 C)] 98.5 F (36.9 C) (02/08 0657) Pulse Rate:  [71-90] 71 (02/08 0657) Resp:  [16-20] 20 (02/08 1000) BP: (88-97)/(44-60) 88/44 mmHg (02/08 0657) SpO2:  [93 %-98 %] 93 % (02/08 1000) Weight:  [53.4 kg (117 lb 11.6 oz)] 53.4 kg (117 lb 11.6 oz) (02/08 0657) Weight change: -0.3 kg (-10.6 oz) Last BM Date: 04/06/14  Intake/Output from previous day: 02/07 0701 - 02/08 0700 In: 240 [P.O.:240] Out: 1050 [Urine:1050] Intake/Output this shift:    General appearance: alert, cooperative and no distress Eyes: conjunctivae/corneas clear. PERRL, EOM's intact. Fundi benign. Neck: no adenopathy, no carotid bruit, no JVD, supple, symmetrical, trachea midline and thyroid not enlarged, symmetric, no tenderness/mass/nodules Resp: clear to auscultation bilaterally Chest wall: no tenderness Cardio: regular rate and rhythm, S1, S2 normal, no murmur, click, rub or gallop GI: soft, non-tender; bowel sounds normal; no masses,  no organomegaly Extremities: extremities normal, atraumatic, no cyanosis or edema Pulses: 2+ and symmetric Skin: Skin color, texture, turgor normal. No rashes or lesions Neurologic: Grossly normal  Lab Results:  Recent Labs  04/07/14 0405 04/08/14 0650  WBC 10.3 10.4  HGB 7.9* 7.7*  HCT 23.0* 22.9*  PLT 298 287   BMET  Recent Labs  04/06/14 1512 04/08/14 0650  NA 139 139  K 3.5 3.6  CL 109 111  CO2 24 24  GLUCOSE 136* 103*  BUN 6 9  CREATININE 0.35* 0.43*  CALCIUM 8.8 8.8    Studies/Results: No results found.  Medications: I have reviewed the patient's current  medications.  Assessment/Plan: A 29 year old female admitted with sickle cell painful crisis.  #1 sickle cell painful crisis: Patient is not using the PCA adequately. She is pressing the button prematurely and has 2:1  of demand and delivery. She has not maxed out what she can get from the PCA. I have informed her that I am not getting put her on any basal dose off physician assisted dosing onto she use a PCA maximally. In the meantime I'll continue the Toradol and supportive care. Monitor H&H again to make sure is not falling   #2 sickle cell anemia: Hemoglobin has been stable close to baseline  #3 chronic pain issues: Patient's pain issues are complicated by her chronic pain syndrome. We'll continue to monitor.    LOS: 3 days   Elaysia Devargas,LAWAL 04/09/2014, 10:55 AM

## 2014-04-10 LAB — COMPREHENSIVE METABOLIC PANEL
ALBUMIN: 3.8 g/dL (ref 3.5–5.2)
ALT: 54 U/L — ABNORMAL HIGH (ref 0–35)
ANION GAP: 5 (ref 5–15)
AST: 76 U/L — ABNORMAL HIGH (ref 0–37)
Alkaline Phosphatase: 97 U/L (ref 39–117)
BILIRUBIN TOTAL: 2.3 mg/dL — AB (ref 0.3–1.2)
BUN: 8 mg/dL (ref 6–23)
CHLORIDE: 111 mmol/L (ref 96–112)
CO2: 23 mmol/L (ref 19–32)
Calcium: 8.7 mg/dL (ref 8.4–10.5)
Creatinine, Ser: 0.48 mg/dL — ABNORMAL LOW (ref 0.50–1.10)
GFR calc Af Amer: >90 mL/min (ref >90)
GLUCOSE: 112 mg/dL — AB (ref 70–99)
Potassium: 3.5 mmol/L (ref 3.5–5.1)
Sodium: 139 mmol/L (ref 135–145)
Total Protein: 6.9 g/dL (ref 6.0–8.3)

## 2014-04-10 LAB — CBC WITH DIFFERENTIAL/PLATELET
Basophils Absolute: 0.1 10*3/uL (ref 0.0–0.1)
Basophils Relative: 1 % (ref 0–1)
Eosinophils Absolute: 0.3 10*3/uL (ref 0.0–0.7)
Eosinophils Relative: 2 % (ref 0–5)
HCT: 23.3 % — ABNORMAL LOW (ref 36.0–46.0)
Hemoglobin: 8 g/dL — ABNORMAL LOW (ref 12.0–15.0)
Lymphocytes Relative: 35 % (ref 12–46)
Lymphs Abs: 3.8 10*3/uL (ref 0.7–4.0)
MCH: 34.9 pg — ABNORMAL HIGH (ref 26.0–34.0)
MCHC: 34.3 g/dL (ref 30.0–36.0)
MCV: 101.7 fL — ABNORMAL HIGH (ref 78.0–100.0)
Monocytes Absolute: 1.2 10*3/uL — ABNORMAL HIGH (ref 0.1–1.0)
Monocytes Relative: 11 % (ref 3–12)
Neutro Abs: 5.3 10*3/uL (ref 1.7–7.7)
Neutrophils Relative %: 51 % (ref 43–77)
Platelets: 283 10*3/uL (ref 150–400)
RBC: 2.29 MIL/uL — ABNORMAL LOW (ref 3.87–5.11)
RDW: 18.2 % — ABNORMAL HIGH (ref 11.5–15.5)
WBC: 10.6 10*3/uL — ABNORMAL HIGH (ref 4.0–10.5)

## 2014-04-10 MED ORDER — HEPARIN SOD (PORK) LOCK FLUSH 100 UNIT/ML IV SOLN
500.0000 [IU] | INTRAVENOUS | Status: DC | PRN
  Filled 2014-04-10: qty 5

## 2014-04-10 MED ORDER — OXYCODONE-ACETAMINOPHEN 10-325 MG PO TABS: 1.0000 | ORAL_TABLET | ORAL | Status: DC | PRN

## 2014-04-10 NOTE — Progress Notes (Signed)
Wasted 4mg  of patient's PCA syringe with Perlie Golderri Brendt Dible.  Agree with waste amouth 4mg /632ml  Rayette Mogg L PharmD Pager [phone] 04/10/2014, 3:37 PM

## 2014-04-10 NOTE — Discharge Summary (Signed)
Brittany CampbellMiranda Foster MRN: [id number] DOB/AGE: 30/03/1985 28 y.o.  Admit date: 04/06/2014 Discharge date: 04/10/2014  Primary Care Physician:  Brittany A., MD   Discharge Diagnoses:   Patient Active Problem List   Diagnosis Date Noted  . Pericardial effusion   . Anemia 02/09/2014  . Hypokalemia 02/09/2014  . Neuropathy 02/09/2014  . Chronic headache disorder 02/09/2014  . Leukocytosis 01/29/2014  . Pain in the chest   . Abnormal CT scan, chest   . Sickle cell anemia with pain 12/30/2013  . Chest pain 12/30/2013  . Sickle cell pain crisis 12/30/2013    DISCHARGE MEDICATION:   Medication List    TAKE these medications        ALEVE 220 MG Caps  Generic drug:  Naproxen Sodium  Take 660 mg by mouth every 6 (six) hours as needed (pain).     folic acid 1 MG tablet  Commonly known as:  FOLVITE  Take 1 tablet (1 mg total) by mouth daily.     gabapentin 300 MG capsule  Commonly known as:  NEURONTIN  Take 1 capsule (300 mg total) by mouth 3 (three) times daily.     hydroxyurea 500 MG capsule  Commonly known as:  HYDREA  Take 1 capsule (500 mg total) by mouth daily. May take with food to minimize GI side effects.     oxyCODONE-acetaminophen 10-325 MG per tablet  Commonly known as:  PERCOCET  Take 1 tablet by mouth every 4 (four) hours as needed for pain.     Topiramate ER 100 MG Cp24  Take 100 mg by mouth at bedtime.          Consults:     SIGNIFICANT DIAGNOSTIC STUDIES:  Dg Chest 2 View  04/06/2014   CLINICAL DATA:  Chest pain extending to left shoulder. Shortness of breath. Sickle cell disease.  EXAM: CHEST - 2 VIEW  COMPARISON:  CTA chest 03/18/2014.  One-view chest 03/18/2014.  FINDINGS: The heart is mildly enlarged. Mild interstitial coarsening is stable. A right IJ Port-A-Cath is stable in position. The tip of the catheter is in the right atrium, stable. The catheter fragment in the innominate vein is stable. No focal airspace disease is present. Vertebral endplate  changes are compatible with sickle cell disease.  IMPRESSION: 1. Stable cardiomegaly and chronic interstitial coarsening. 2. No superimposed acute cardiopulmonary disease. 3. The tip of the right IJ Port-A-Cath is in the right atrium.   Electronically Signed   By: Brittany Pachris  Foster M.D.   On: 04/06/2014 14:55   Ct Angio Chest Pe W/cm &/or Wo Cm  03/18/2014   CLINICAL DATA:  Chest pain. Shortness of breath. Sickle cell disease.  EXAM: CT ANGIOGRAPHY CHEST WITH CONTRAST  TECHNIQUE: Multidetector CT imaging of the chest was performed using the standard protocol during bolus administration of intravenous contrast. Multiplanar CT image reconstructions and MIPs were obtained to evaluate the vascular anatomy.  CONTRAST:  100mL OMNIPAQUE IOHEXOL 350 MG/ML SOLN  COMPARISON:  Multiple exams, including 02/09/2014 and 03/18/2014  FINDINGS: No filling defect is identified in the pulmonary arterial tree to suggest pulmonary embolus. No acute aortic findings. Stably prominent thymus. Pericardial effusion inferiorly.  Small type 1 hiatal hernia.  Trace right pleural effusion.  No pathologic thoracic adenopathy. Subsegmental atelectasis anteriorly in the right lower lobe.  4.1 cm catheter fragment is stuck in The brachiocephalic vein.  Mild periportal edema in the upper abdomen. Small hyperdense spleen. Sclerosis in the sternum and in portions of the sixth and eighth thoracic  vertebra are likely a manifestation of sickle cell disease.  Review of the MIP images confirms the above findings.  IMPRESSION: 1. No pulmonary embolus or acute aortic findings. 2. Small to moderate pericardial effusion inferiorly. This could be further assessed with echocardiography if clinically warranted, particularly if the patient has jugular venous distension or other signs of tamponade. 3. Trace right pleural effusion. Subsegmental atelectasis anterolaterally in the right lower lobe. 4. Small type 1 hiatal hernia. 5. Mild periportal edema. 6.  Manifestations of sickle cell disease include a small hyperdense spleen, thymic hyperplasia, and osseous manifestations.   Electronically Signed   By: Brittany Foster M.D.   On: 03/18/2014 20:22   Dg Chest Port 1 View  03/18/2014   CLINICAL DATA:  Chest pain. Weakness. Sickle cell disease. Shortness of breath.  EXAM: PORTABLE CHEST - 1 VIEW  COMPARISON:  02/09/2014 CT scan  FINDINGS: Upper normal heart size. Indistinct pulmonary vasculature. Right-sided Port-A-Cath tip: Right atrium. Catheter fragment is present in the brachiocephalic vein, a chronic finding.  No airspace opacity identified.  IMPRESSION: 1. Mildly prominent upper zone pulmonary vasculature may be incidental given the semi-erect nature of today's exam, but could reflect pulmonary venous hypertension. Upper normal heart size. 2. Stable chronic catheter fragment in the brachia cephalic vein. 3. No airspace opacity observed.   Electronically Signed   By: Brittany Foster M.D.   On: 03/18/2014 17:45      No results found for this or any previous visit (from the past 240 hour(s)).  BRIEF ADMITTING H & P: This is a patient with Hb SS who presented to the ED with complaints of acute pain associated with Sickle Cell Crisis. She had no other associated symptoms.Pt was discharged from [facility] 2 days before presentation. Please see H&P for further details.   Foster Course:  Present on Admission:  . Chronic Pain: In review of patients records from [facility] and Care Everywhere, it is evident that Brittany Foster has been going between hospitals with admissions about every 2-3 days from the most recent discharge. I had a conference with Brittany Foster, Brittany Foster  [facility]) and Brittany Foster (Manager Hematology Oncology Department Brittany Foster Foster). This addressed the nature of her pain as chronic and advised Brittany Foster that a patient who is chronically ill appears chronically ill which she does not. She admitted that  she did not have her oral analgesics at home. I also reviewed her opiate contract and patient insists that she did not niolate her agreement although [facility] reports that she received 30 tabs from  [facility] on 03/09/2014. I have advised Ms. Moon that I will develop a care plan to be used on presentation to the ED.   Marland Kitchen Emotional Lability: Pt frequently has outbursts of anger during simple conversations. She fluctuates between calm and volatility in an instant. I have offered a Psych consult and patient adamantly refused. I believe that Verlia has an underlying undiagnosed Psychiatric disorder and a personality disorder. Will discuss further in the out patient setting as in order to continue prescribing opiates for Ms. Nabor she will have to be Psychiatrically evluated.  . Sickle cell pain crisis: I do not believe that Ms. Deyarmin was actually in a crisis.She was treated with IV dilaudid, Toradol and IVF and discharged on Oxycodone-APAP 10-325 mg. A prescription was written for Oxy-Apap 10-325 mg #90 tabs.  Disposition and Follow-up:  Pt was discharged in good condition and is to follow up in  the clinic as scheduled.     Discharge Instructions    Activity as tolerated - No restrictions    Complete by:  As directed      Diet general    Complete by:  As directed            DISCHARGE EXAM:  General: Alert, awake, oriented x3, in NAD.  Vital signs: BP 91/49, HR 74, T 98.4 F (36.9 C), temperature source Oral, RR 14, height 5' (1.524 m), weight 117 lb (53.071 kg), last menstrual period 03/04/2014, SpO2 98 %. HEENT: [place]/AT PEERL, EOMI, anicteric Neck: Trachea midline, no masses, no thyromegal,y no JVD, no carotid bruit OROPHARYNX: Moist, No exudate/ erythema/lesions.  Heart: Regular rate and rhythm, without murmurs, rubs, gallops, PMI non-displaced.  Lungs: Clear to auscultation, no wheezing or rhonchi noted.  Abdomen: Soft, nontender, nondistended, positive bowel sounds, no  masses no hepatosplenomegaly noted.  Neuro: No focal neurological deficits noted cranial nerves II through XII grossly intact. DTRs 2+ bilaterally upper and lower extremities. Strength at baseline in bilateral upper and lower extremities. Musculoskeletal: No warm swelling or erythema around joints, no spinal tenderness noted. Psychiatric: Patient alert and oriented x3, good insight and cognition, good recent to remote recall. Lymph node survey: No cervical axillary or inguinal lymphadenopathy noted.     Recent Labs  04/08/14 0650 04/10/14 0446  NA 139 139  K 3.6 3.5  CL 111 111  CO2 24 23  GLUCOSE 103* 112*  BUN 9 8  CREATININE 0.43* 0.48*  CALCIUM 8.8 8.7    Recent Labs  04/08/14 0650 04/10/14 0446  AST 65* 76*  ALT 45* 54*  ALKPHOS 90 97  BILITOT 2.4* 2.3*  PROT 7.0 6.9  ALBUMIN 3.8 3.8   No results for input(s): LIPASE, AMYLASE in the last 72 hours.  Recent Labs  04/08/14 0650 04/10/14 0446  WBC 10.4 10.6*  NEUTROABS 4.3 5.3  HGB 7.7* 8.0*  HCT 22.9* 23.3*  MCV 101.8* 101.7*  PLT 287 283   Total time spent including face to face and decision making was greater than 30 minutes.  Signed: [name],[name] 04/10/2014, 3:17 PM   ED Care Plan: 1. IV hydration 125 ml/hr D5.45 NS 2. Toradol 30 mg IV every 6 hours. 3. Dilaudid 4 mg PO every 2 hours x 2 doses should be tried 1st. If no improvement then escalate to IV Dilaudid 2 mg  May repeat x 3. If pain persists then admit. 4. During Admission: Limit IV Dilaudid to 48 hours then transition to orals.

## 2014-04-10 NOTE — Progress Notes (Signed)
Patient was stable at time of discharge. I deaccessed port with saline and heparin flush. I reviewed discharge education with patient. She verbalized understanding and had no further questions. Patient left with prescription in hand.

## 2014-04-10 NOTE — Telephone Encounter (Signed)
See previous documentation, to date patient has never called our office to reschedule. Appt was cancelled and referring provider's office has been notified via EPIC referral / Sherri S.

## 2014-04-18 ENCOUNTER — Telehealth (HOSPITAL_COMMUNITY): Payer: Self-pay | Admitting: Internal Medicine

## 2014-04-18 ENCOUNTER — Telehealth (HOSPITAL_COMMUNITY): Payer: Self-pay | Admitting: Hematology

## 2014-04-18 NOTE — Telephone Encounter (Signed)
Spoke with Dr. Ashley RoyaltyMatthews and it is [facility].  Patient states she will try to get a ride, but if not she would come tomorrow.  I explained the triage process and how she needs to notify us if she is going to be able to come today.  If not, she will need to call in the morning and get triaged again to determine if [facility].  Patient verbalizes understanding.

## 2014-04-18 NOTE — Telephone Encounter (Signed)
Pt called and states that she is not able to come to the day hospital today; pt states that she will call the day hospital in the AM if she is experiencing pain; notified pt that she should go to ED in her area if she is unable to control pain at home; pt verbalizes understanding

## 2014-04-18 NOTE — Telephone Encounter (Signed)
[facility] in [facility] and [facility] fluids.  Patient C/O pain to legs, back, chest.  Denies radiating to arm, jaw or neck.  Pain is 7/10 on pain scale.  Patient states she has taken percocet and naproxen today without improved.  C/O of nausea but denies emesis.  Denies fever, abdominal pain.  I advised I would notify the physician and give her a call back.

## 2014-04-19 ENCOUNTER — Encounter (HOSPITAL_COMMUNITY): Payer: Self-pay

## 2014-04-19 ENCOUNTER — Non-Acute Institutional Stay (HOSPITAL_COMMUNITY)
Admission: AD | Admit: 2014-04-19 | Discharge: 2014-04-19 | Disposition: A | Discharge status: Home / Self Care | Payer: Medicaid Other | Attending: Internal Medicine | Admitting: Internal Medicine

## 2014-04-19 ENCOUNTER — Telehealth (HOSPITAL_COMMUNITY): Payer: Self-pay | Admitting: *Deleted

## 2014-04-19 DIAGNOSIS — Z79899 Other long term (current) drug therapy: Secondary | ICD-10-CM | POA: Insufficient documentation

## 2014-04-19 DIAGNOSIS — E860 Dehydration: Secondary | ICD-10-CM | POA: Insufficient documentation

## 2014-04-19 DIAGNOSIS — Z791 Long term (current) use of non-steroidal anti-inflammatories (NSAID): Secondary | ICD-10-CM | POA: Insufficient documentation

## 2014-04-19 DIAGNOSIS — D5700 Hb-SS disease with crisis, unspecified: Secondary | ICD-10-CM | POA: Diagnosis not present

## 2014-04-19 DIAGNOSIS — Z9114 Patient's other noncompliance with medication regimen: Secondary | ICD-10-CM | POA: Insufficient documentation

## 2014-04-19 DIAGNOSIS — M79606 Pain in leg, unspecified: Secondary | ICD-10-CM | POA: Diagnosis present

## 2014-04-19 LAB — CBC WITH DIFFERENTIAL/PLATELET
BASOS ABS: 0 10*3/uL (ref 0.0–0.1)
Basophils Relative: 0 % (ref 0–1)
Eosinophils Absolute: 0 10*3/uL (ref 0.0–0.7)
Eosinophils Relative: 0 % (ref 0–5)
HCT: 26.4 % — ABNORMAL LOW (ref 36.0–46.0)
HEMOGLOBIN: 9 g/dL — AB (ref 12.0–15.0)
LYMPHS ABS: 2.8 10*3/uL (ref 0.7–4.0)
LYMPHS PCT: 23 % (ref 12–46)
MCH: 35.4 pg — AB (ref 26.0–34.0)
MCHC: 34.1 g/dL (ref 30.0–36.0)
MCV: 103.9 fL — ABNORMAL HIGH (ref 78.0–100.0)
Monocytes Absolute: 1 10*3/uL (ref 0.1–1.0)
Monocytes Relative: 8 % (ref 3–12)
NEUTROS PCT: 69 % (ref 43–77)
Neutro Abs: 8.3 10*3/uL — ABNORMAL HIGH (ref 1.7–7.7)
PLATELETS: 302 10*3/uL (ref 150–400)
RBC: 2.54 MIL/uL — ABNORMAL LOW (ref 3.87–5.11)
RDW: 18.3 % — ABNORMAL HIGH (ref 11.5–15.5)
WBC: 12.2 10*3/uL — ABNORMAL HIGH (ref 4.0–10.5)

## 2014-04-19 LAB — COMPREHENSIVE METABOLIC PANEL
ALT: 29 U/L (ref 0–35)
ANION GAP: 4 — AB (ref 5–15)
AST: 46 U/L — ABNORMAL HIGH (ref 0–37)
Albumin: 4.1 g/dL (ref 3.5–5.2)
Alkaline Phosphatase: 105 U/L (ref 39–117)
BUN: 6 mg/dL (ref 6–23)
CALCIUM: 8.8 mg/dL (ref 8.4–10.5)
CO2: 23 mmol/L (ref 19–32)
Chloride: 110 mmol/L (ref 96–112)
Creatinine, Ser: 0.35 mg/dL — ABNORMAL LOW (ref 0.50–1.10)
GFR calc Af Amer: >90 mL/min (ref >90)
GFR calc non Af Amer: >90 mL/min (ref >90)
Glucose, Bld: 100 mg/dL — ABNORMAL HIGH (ref 70–99)
POTASSIUM: 3.4 mmol/L — AB (ref 3.5–5.1)
Sodium: 137 mmol/L (ref 135–145)
TOTAL PROTEIN: 7.6 g/dL (ref 6.0–8.3)
Total Bilirubin: 2.7 mg/dL — ABNORMAL HIGH (ref 0.3–1.2)

## 2014-04-19 LAB — LACTATE DEHYDROGENASE: LDH: 278 U/L — ABNORMAL HIGH (ref 94–250)

## 2014-04-19 LAB — RETICULOCYTES
RBC.: 2.54 MIL/uL — AB (ref 3.87–5.11)
RETIC COUNT ABSOLUTE: 378.5 10*3/uL — AB (ref 19.0–186.0)
Retic Ct Pct: 14.9 % — ABNORMAL HIGH (ref 0.4–3.1)

## 2014-04-19 MED ORDER — ONDANSETRON HCL 4 MG/2ML IJ SOLN
4.0000 mg | Freq: Four times a day (QID) | INTRAMUSCULAR | Status: DC | PRN
Start: 2014-04-19 — End: 2014-04-19

## 2014-04-19 MED ORDER — HEPARIN SOD (PORK) LOCK FLUSH 100 UNIT/ML IV SOLN
500.0000 [IU] | INTRAVENOUS | Status: AC | PRN
  Administered 2014-04-19: 500 [IU]
  Filled 2014-04-19: qty 5

## 2014-04-19 MED ORDER — SODIUM CHLORIDE 0.9 % IJ SOLN: 9.0000 mL | INTRAMUSCULAR | Status: DC | PRN

## 2014-04-19 MED ORDER — POLYETHYLENE GLYCOL 3350 17 G PO PACK
17.0000 g | PACK | Freq: Every day | ORAL | Status: DC | PRN
  Filled 2014-04-19: qty 1

## 2014-04-19 MED ORDER — SODIUM CHLORIDE 0.9 % IJ SOLN
10.0000 mL | INTRAMUSCULAR | Status: AC | PRN
  Administered 2014-04-19: 10 mL

## 2014-04-19 MED ORDER — OXYCODONE HCL 5 MG PO TABS
5.0000 mg | ORAL_TABLET | Freq: Once | ORAL | Status: AC
  Administered 2014-04-19: 5 mg via ORAL
  Filled 2014-04-19: qty 1

## 2014-04-19 MED ORDER — DEXTROSE-NACL 5-0.45 % IV SOLN
INTRAVENOUS | Status: DC
  Administered 2014-04-19: 12:00:00 via INTRAVENOUS

## 2014-04-19 MED ORDER — OXYCODONE-ACETAMINOPHEN 5-325 MG PO TABS
1.0000 | ORAL_TABLET | Freq: Once | ORAL | Status: AC
  Administered 2014-04-19: 1 via ORAL
  Filled 2014-04-19: qty 1

## 2014-04-19 MED ORDER — FOLIC ACID 1 MG PO TABS: 1.0000 mg | ORAL_TABLET | Freq: Every day | ORAL | Status: DC

## 2014-04-19 MED ORDER — NALOXONE HCL 0.4 MG/ML IJ SOLN: 0.4000 mg | INTRAMUSCULAR | Status: DC | PRN

## 2014-04-19 MED ORDER — SENNOSIDES-DOCUSATE SODIUM 8.6-50 MG PO TABS: 1.0000 | ORAL_TABLET | Freq: Two times a day (BID) | ORAL | Status: DC

## 2014-04-19 MED ORDER — SODIUM CHLORIDE 0.9 % IV SOLN
12.5000 mg | Freq: Four times a day (QID) | INTRAVENOUS | Status: DC | PRN
  Administered 2014-04-19: 12.5 mg via INTRAVENOUS
  Filled 2014-04-19 (×2): qty 0.25

## 2014-04-19 MED ORDER — HYDROMORPHONE 2 MG/ML HIGH CONCENTRATION IV PCA SOLN
INTRAVENOUS | Status: DC
  Administered 2014-04-19: 11 mg via INTRAVENOUS
  Administered 2014-04-19: 12:00:00 via INTRAVENOUS
  Administered 2014-04-19: 5.5 mg via INTRAVENOUS
  Filled 2014-04-19: qty 25

## 2014-04-19 MED ORDER — DIPHENHYDRAMINE HCL 12.5 MG/5ML PO ELIX: 12.5000 mg | ORAL_SOLUTION | Freq: Four times a day (QID) | ORAL | Status: DC | PRN

## 2014-04-19 NOTE — Progress Notes (Signed)
Patient ID: Brittany Foster, female   DOB: 08/28/1985, 29 y.o.   MRN: [id number] Dx: Brittany Foster cell anemia Dr.[name] Pt [facility] the [facility] with complaints of 8/10 pain in legs, back, ankles; Pt is evaluated and treated with fluids and PCA, labs were drawn.  Pt continued to report pain, denied effects of medications. Was evaluated by Dr.Garba. Pt is stable and discharged to home. Brittany Foster, Brittany GunnerIrina,Brittany Foster

## 2014-04-19 NOTE — Progress Notes (Signed)
Dr.Garba notified and aware of consistent low BP; order to continue monitor. Magenta Schmiesing, [name],RN

## 2014-04-19 NOTE — Discharge Summary (Signed)
Physician Discharge Summary  Patient ID: Brittany Foster MRN: [id number] DOB/AGE: 30/03/1985 29 y.o.  Admit date: 04/19/2014 Discharge date: 04/19/2014  Admission Diagnoses:  Discharge Diagnoses:  Active Problems:   Sickle cell pain crisis   Sickle cell disease with crisis   Discharged Condition: good  Hospital Course: A 29 yo admitted with sickle cell painful crisis. She was treated with IV Dilaudid PCA Toradol and IV fluids. She was later transitioned to her oral medications. Pain went down from 10 out of 10-6 out of 10. She was discharged home to follow-up with Dr. Ashley Foster as scheduled. Patient has a tendency to go to multiple hospitals and she has been counseled accordingly.  Consults: None  Significant Diagnostic Studies: labs: CBCs and CMP is checked serially all within normal limits.  Treatments: IV hydration and analgesia: Dilaudid  Discharge Exam: Foster pressure 99/55, pulse 67, temperature 98.7 F (37.1 C), temperature source Oral, resp. rate 16, weight 50.803 kg (112 lb), SpO2 100 %. General appearance: alert, cooperative and no distress Eyes: conjunctivae/corneas clear. PERRL, EOM's intact. Fundi benign. Neck: no adenopathy, no carotid bruit, no JVD, supple, symmetrical, trachea midline and thyroid not enlarged, symmetric, no tenderness/mass/nodules Back: symmetric, no curvature. ROM normal. No CVA tenderness. Resp: clear to auscultation bilaterally Chest wall: no tenderness Cardio: regular rate and rhythm, S1, S2 normal, no murmur, click, rub or gallop GI: soft, non-tender; bowel sounds normal; no masses,  no organomegaly Extremities: extremities normal, atraumatic, no cyanosis or edema Pulses: 2+ and symmetric Skin: Skin color, texture, turgor normal. No rashes or lesions Neurologic: Grossly normal  Disposition: 01-Home or Self Care     Medication List    TAKE these medications        ALEVE 220 MG Caps  Generic drug:  Naproxen Sodium  Take 660 mg by mouth  every 6 (six) hours as needed (pain).     folic acid 1 MG tablet  Commonly known as:  FOLVITE  Take 1 tablet (1 mg total) by mouth daily.     gabapentin 300 MG capsule  Commonly known as:  NEURONTIN  Take 1 capsule (300 mg total) by mouth 3 (three) times daily.     hydroxyurea 500 MG capsule  Commonly known as:  HYDREA  Take 1 capsule (500 mg total) by mouth daily. May take with food to minimize GI side effects.     oxyCODONE-acetaminophen 10-325 MG per tablet  Commonly known as:  PERCOCET  Take 1 tablet by mouth every 4 (four) hours as needed for pain.     Topiramate ER 100 MG Cp24  Take 100 mg by mouth at bedtime.         SignedLonia Foster: Brittany Foster,LAWAL 04/19/2014, 6:03 PM

## 2014-04-19 NOTE — Telephone Encounter (Signed)
Patient called the Day Center to report pain 8/10 in chest, legs, back; Percocet tablet was taken with no relief; nausea present, but denies vomiting/diarrhea, fever. Dr.Garba notified and pt is OK to come in for evaluation and treatment. [name] is called back and invited to come in. Pt states she will be here at 0945. Chance Munter, [name],RN

## 2014-04-19 NOTE — H&P (Signed)
Brittany Foster is an 29 y.o. female.   Chief Complaint: Pain in the limbs on back 2 days HPI: A 29 year old female was known history of sickle cell disease coming to the day hospital was pain in her legs on backs rated as 10 out of 10 persistent which has been getting worse for the last 2 days. Denied any nausea vomiting or diarrhea. She denied any cough or shortness of breath. Patient has been taking her home medications for the last 2 days but no relief. She decided to call the day hospital and come in for father treatment.  Past Medical History  Diagnosis Date  . Sickle cell anemia     Past Surgical History  Procedure Laterality Date  . Tubal ligation    . Cholecystectomy    . Cesarean section    . Port a cath placement Right     about 6-7 years ago  . Cholecystectomy  2000    Family History  Problem Relation Age of Onset  . Sickle cell anemia Other   . Sickle cell trait Father   . Sickle cell trait Mother    Social History:  reports that she has quit smoking. She does not have any smokeless tobacco history on file. She reports that she does not drink alcohol or use illicit drugs.  Allergies:  Allergies  Allergen Reactions  . Ultram [Tramadol] Other (See Comments)    seizures  . Morphine And Related Hives, Rash and Other (See Comments)    shaking    Medications Prior to Admission  Medication Sig Dispense Refill  . folic acid (FOLVITE) 1 MG tablet Take 1 tablet (1 mg total) by mouth daily. 30 tablet 11  . gabapentin (NEURONTIN) 300 MG capsule Take 1 capsule (300 mg total) by mouth 3 (three) times daily. 90 capsule 2  . hydroxyurea (HYDREA) 500 MG capsule Take 1 capsule (500 mg total) by mouth daily. May take with food to minimize GI side effects. 30 capsule 0  . Naproxen Sodium (ALEVE) 220 MG CAPS Take 660 mg by mouth every 6 (six) hours as needed (pain).    Marland Kitchen. oxyCODONE-acetaminophen (PERCOCET) 10-325 MG per tablet Take 1 tablet by mouth every 4 (four) hours as needed for  pain. 90 tablet 0  . Topiramate ER 100 MG CP24 Take 100 mg by mouth at bedtime.      No results found for this or any previous visit (from the past 48 hour(s)). No results found.  Review of Systems  Constitutional: Negative.   Eyes: Negative.   Respiratory: Negative.   Cardiovascular: Negative.   Gastrointestinal: Negative.   Genitourinary: Negative.   Musculoskeletal: Positive for myalgias, back pain and neck pain. Negative for joint pain and falls.  Skin: Negative.   Neurological: Negative.   Endo/Heme/Allergies: Negative.   Psychiatric/Behavioral: Negative.     There were no vitals taken for this visit. Physical Exam  Constitutional: She is oriented to person, place, and time. She appears well-developed and well-nourished.  HENT:  Head: Normocephalic and atraumatic.  Eyes: Conjunctivae and EOM are normal. Pupils are equal, round, and reactive to light.  Neck: Normal range of motion. Neck supple.  Cardiovascular: Normal rate, regular rhythm, normal heart sounds and intact distal pulses.   Respiratory: Effort normal and breath sounds normal.  GI: Soft. Bowel sounds are normal.  Musculoskeletal: Normal range of motion. She exhibits no edema or tenderness.  Neurological: She is alert and oriented to person, place, and time. She has normal reflexes.  Skin: Skin is warm and dry.  Psychiatric: She has a normal mood and affect.     Assessment/Plan A 29 year old female here with sickle cell painful crisis.  #1 sickle cell painful crisis: Patient will be admitted to the sickle cell day hospital. We will start her on IV Dilaudid, using PCA with Toradol Benadryl as well as IV fluid. The goal is to bring the pain down to at least functional level.  #2 sickle cell anemia: Patient's H&H will be followed. She'll be monitored closely.  #3 dehydration: We'll hydrate patient aggressively.  #4 medication noncompliance: Patient seems to have gone to multiple medical centers in the past.  She's her problem with narcotics dependence. We will focus mainly on treating her acute pain today.  GARBA,LAWAL 04/19/2014, 11:02 AM

## 2014-04-23 ENCOUNTER — Telehealth (HOSPITAL_COMMUNITY): Payer: Self-pay | Admitting: *Deleted

## 2014-04-23 NOTE — Telephone Encounter (Signed)
Patient chief complaint of back and leg pain rating of 8 out of 10 level. No fever, sharp shooting chest pain, SOB, and epigastric pain. Informed patient I would notify MD.

## 2014-04-23 NOTE — Telephone Encounter (Signed)
Patient informed to visit the ED per MD with  presenting symptoms.

## 2014-04-24 ENCOUNTER — Emergency Department (HOSPITAL_COMMUNITY): Payer: Medicaid Other

## 2014-04-24 ENCOUNTER — Encounter (HOSPITAL_COMMUNITY): Payer: Self-pay

## 2014-04-24 ENCOUNTER — Other Ambulatory Visit: Payer: Self-pay | Admitting: Internal Medicine

## 2014-04-24 ENCOUNTER — Other Ambulatory Visit: Payer: Medicaid Other

## 2014-04-24 ENCOUNTER — Non-Acute Institutional Stay (HOSPITAL_COMMUNITY)
Admission: EM | Admit: 2014-04-24 | Discharge: 2014-04-24 | Disposition: A | Discharge status: Home / Self Care | Payer: Medicaid Other | Attending: Emergency Medicine | Admitting: Emergency Medicine

## 2014-04-24 ENCOUNTER — Other Ambulatory Visit: Payer: Self-pay | Admitting: Hematology

## 2014-04-24 DIAGNOSIS — Z885 Allergy status to narcotic agent status: Secondary | ICD-10-CM | POA: Diagnosis not present

## 2014-04-24 DIAGNOSIS — D5700 Hb-SS disease with crisis, unspecified: Secondary | ICD-10-CM

## 2014-04-24 DIAGNOSIS — Z79899 Other long term (current) drug therapy: Secondary | ICD-10-CM | POA: Diagnosis not present

## 2014-04-24 DIAGNOSIS — R52 Pain, unspecified: Secondary | ICD-10-CM | POA: Diagnosis present

## 2014-04-24 DIAGNOSIS — Z87891 Personal history of nicotine dependence: Secondary | ICD-10-CM | POA: Insufficient documentation

## 2014-04-24 DIAGNOSIS — R0789 Other chest pain: Secondary | ICD-10-CM | POA: Insufficient documentation

## 2014-04-24 DIAGNOSIS — E559 Vitamin D deficiency, unspecified: Secondary | ICD-10-CM

## 2014-04-24 LAB — CBC WITH DIFFERENTIAL/PLATELET
Basophils Absolute: 0 10*3/uL (ref 0.0–0.1)
Basophils Relative: 0 % (ref 0–1)
Eosinophils Absolute: 0.1 10*3/uL (ref 0.0–0.7)
Eosinophils Relative: 1 % (ref 0–5)
HCT: 24.1 % — ABNORMAL LOW (ref 36.0–46.0)
Hemoglobin: 8.2 g/dL — ABNORMAL LOW (ref 12.0–15.0)
Lymphocytes Relative: 26 % (ref 12–46)
Lymphs Abs: 2.9 10*3/uL (ref 0.7–4.0)
MCH: 35.2 pg — ABNORMAL HIGH (ref 26.0–34.0)
MCHC: 34 g/dL (ref 30.0–36.0)
MCV: 103.4 fL — AB (ref 78.0–100.0)
MONO ABS: 0.9 10*3/uL (ref 0.1–1.0)
Monocytes Relative: 8 % (ref 3–12)
NEUTROS ABS: 7.1 10*3/uL (ref 1.7–7.7)
Neutrophils Relative %: 65 % (ref 43–77)
Platelets: 334 10*3/uL (ref 150–400)
RBC: 2.33 MIL/uL — ABNORMAL LOW (ref 3.87–5.11)
RDW: 17.7 % — ABNORMAL HIGH (ref 11.5–15.5)
WBC: 11 10*3/uL — ABNORMAL HIGH (ref 4.0–10.5)

## 2014-04-24 LAB — URINALYSIS, ROUTINE W REFLEX MICROSCOPIC
BILIRUBIN URINE: NEGATIVE
GLUCOSE, UA: NEGATIVE mg/dL
HGB URINE DIPSTICK: NEGATIVE
Ketones, ur: NEGATIVE mg/dL
LEUKOCYTES UA: NEGATIVE
Nitrite: NEGATIVE
PH: 7 (ref 5.0–8.0)
Protein, ur: NEGATIVE mg/dL
Specific Gravity, Urine: 1.014 (ref 1.005–1.030)
Urobilinogen, UA: 2 mg/dL — ABNORMAL HIGH (ref 0.0–1.0)

## 2014-04-24 LAB — BASIC METABOLIC PANEL
Anion gap: 3 — ABNORMAL LOW (ref 5–15)
BUN: 9 mg/dL (ref 6–23)
CALCIUM: 8.5 mg/dL (ref 8.4–10.5)
CO2: 24 mmol/L (ref 19–32)
CREATININE: 0.44 mg/dL — AB (ref 0.50–1.10)
Chloride: 114 mmol/L — ABNORMAL HIGH (ref 96–112)
GFR calc non Af Amer: >90 mL/min (ref >90)
Glucose, Bld: 111 mg/dL — ABNORMAL HIGH (ref 70–99)
Potassium: 3.5 mmol/L (ref 3.5–5.1)
Sodium: 141 mmol/L (ref 135–145)

## 2014-04-24 LAB — RETICULOCYTES
RBC.: 2.33 MIL/uL — ABNORMAL LOW (ref 3.87–5.11)
RETIC CT PCT: 14.6 % — AB (ref 0.4–3.1)
Retic Count, Absolute: 340.2 10*3/uL — ABNORMAL HIGH (ref 19.0–186.0)

## 2014-04-24 LAB — PREGNANCY, URINE: Preg Test, Ur: NEGATIVE

## 2014-04-24 LAB — TROPONIN I: Troponin I: <0.03 ng/mL (ref <0.031)

## 2014-04-24 MED ORDER — HYDROMORPHONE HCL 1 MG/ML IJ SOLN
1.0000 mg | Freq: Once | INTRAMUSCULAR | Status: AC
  Administered 2014-04-24: 1 mg via INTRAVENOUS
  Filled 2014-04-24: qty 1

## 2014-04-24 MED ORDER — DEXTROSE-NACL 5-0.45 % IV SOLN
INTRAVENOUS | Status: DC
  Administered 2014-04-24: 14:00:00 via INTRAVENOUS

## 2014-04-24 MED ORDER — DIPHENHYDRAMINE HCL 50 MG/ML IJ SOLN
25.0000 mg | Freq: Once | INTRAMUSCULAR | Status: AC
  Administered 2014-04-24: 25 mg via INTRAVENOUS
  Filled 2014-04-24: qty 1

## 2014-04-24 MED ORDER — OXYCODONE-ACETAMINOPHEN 10-325 MG PO TABS: 1.0000 | ORAL_TABLET | ORAL | Status: DC | PRN

## 2014-04-24 MED ORDER — KETOROLAC TROMETHAMINE 30 MG/ML IJ SOLN: 30.0000 mg | Freq: Four times a day (QID) | INTRAMUSCULAR | Status: DC

## 2014-04-24 MED ORDER — SODIUM CHLORIDE 0.9 % IJ SOLN: 10.0000 mL | INTRAMUSCULAR | Status: DC | PRN

## 2014-04-24 MED ORDER — KETOROLAC TROMETHAMINE 30 MG/ML IJ SOLN
30.0000 mg | Freq: Once | INTRAMUSCULAR | Status: AC
  Administered 2014-04-24: 30 mg via INTRAVENOUS
  Filled 2014-04-24: qty 1

## 2014-04-24 MED ORDER — SODIUM CHLORIDE 0.9 % IJ SOLN
INTRAMUSCULAR | Status: AC
  Administered 2014-04-24: 10 mL
  Filled 2014-04-24: qty 10

## 2014-04-24 MED ORDER — DIPHENHYDRAMINE HCL 25 MG PO CAPS: 25.0000 mg | ORAL_CAPSULE | Freq: Four times a day (QID) | ORAL | Status: DC | PRN

## 2014-04-24 MED ORDER — HEPARIN SOD (PORK) LOCK FLUSH 100 UNIT/ML IV SOLN
500.0000 [IU] | INTRAVENOUS | Status: DC | PRN
  Filled 2014-04-24: qty 5

## 2014-04-24 MED ORDER — HYDROMORPHONE HCL 4 MG PO TABS
4.0000 mg | ORAL_TABLET | ORAL | Status: DC
  Administered 2014-04-24: 4 mg via ORAL
  Filled 2014-04-24: qty 1

## 2014-04-24 MED ORDER — HYDROMORPHONE HCL 2 MG/ML IJ SOLN
2.0000 mg | Freq: Once | INTRAMUSCULAR | Status: AC
  Administered 2014-04-24: 2 mg via INTRAVENOUS
  Filled 2014-04-24: qty 1

## 2014-04-24 MED ORDER — LORAZEPAM 2 MG/ML IJ SOLN
1.0000 mg | Freq: Once | INTRAMUSCULAR | Status: AC
  Administered 2014-04-24: 1 mg via INTRAVENOUS
  Filled 2014-04-24: qty 1

## 2014-04-24 NOTE — ED Notes (Signed)
Patient reports right chest pain that radiates to the left arm since yesterday. Patient was seen in the ED 2 weeks ago for the same. Patient also c/o epigastric pain and states she vomited last night and today has nausea. Patient states she also has low back pain. Patient denies any dysuria or urinary frequency.

## 2014-04-24 NOTE — ED Notes (Addendum)
Pt to be transferred to sickle cell center for further evaluation. Nurse called, bed available, report given.

## 2014-04-24 NOTE — ED Notes (Signed)
Family at bedside. 

## 2014-04-24 NOTE — ED Notes (Signed)
Still unable to draw blood from PORT.

## 2014-04-24 NOTE — ED Notes (Signed)
Attempted to draw blood peripherally without success.

## 2014-04-24 NOTE — ED Notes (Signed)
Unable to obtain blood from port at this time. Blood return noted from port during initial access. Blood currently will not draw back. Pt states she tastes salt in her mouth, no signs of infiltration present.  Patient requests that blood be drawn from port only. Will flush port, wait, and attempt to draw blood again.  Pt aware of need for urine sample. Pt states she is currently unable to provide urine. Will reassess.

## 2014-04-24 NOTE — Progress Notes (Signed)
Patient ID: Brittany Foster, female   DOB: 01/02/1986, 29 y.o.   MRN: [id number]    Patient discharged home ambulatory via medicaid transportation service. Pt a&ox4, pt in no distress. At time of discharge, pt reports moderate pain that can be managed at home. Rt chest port flushed with saline, packed with heparin, de-accessed, and dry dressing applied. Pt tolerated well. Pt received all belongings, prescription, follow-up appointment and verbalized understanding of discharge instructions. Pt denies any further needs at this time.

## 2014-04-24 NOTE — Telephone Encounter (Signed)
Prescription written for Percocet 10-325 mg # 90 tabs. [facility] reviewed and no inconsistencies noted.

## 2014-04-24 NOTE — H&P (Signed)
[facility] History and Physical  Brittany Foster WUJ:[id number] DOB: [birth year] DOA: 04/24/2014   PCP: Shaunessy Dobratz A., MD   Chief Complaint: Pain in chest wall x 4 days  HPI: Pt with Hb SS well known to me presented to the ED this morning  after having pain for 4 days. She was seen in the ED at [facility] in the last 48 hours. Pt reports that she  attempted to manage with her oral medications but was unsuccessful in his attempts to control pain and she is now out of her medications but did not call to request them.Her pain is localized to the chest wall and she rates pain as 6/10. The pain is described as sharp and at times throbbing. The pain is not associated with any other symptoms .She denies any SOB, dizziness, pre-syncope. Fevers, chills N/V/D. She is being admitted to the [facility] for management of early acute sickle cell crisis.     Review of Systems:  Constitutional: No weight loss, night sweats, Fevers, chills, fatigue.  HEENT: No headaches, dizziness, seizures, vision changes, difficulty swallowing,Tooth/dental problems,Sore throat, No sneezing, itching, ear ache, nasal congestion, post nasal drip,  Cardio-vascular: No chest pain, Orthopnea, PND, swelling in lower extremities, anasarca, dizziness, palpitations  GI: No heartburn, indigestion, abdominal pain, nausea, vomiting, diarrhea, change in bowel habits, loss of appetite  Resp: No shortness of breath with exertion or at rest. No excess mucus, no productive cough, No non-productive cough, No coughing up of blood.No change in color of mucus.No wheezing.No chest wall deformity  Skin: no rash or lesions.  GU: no dysuria, change in color of urine, no urgency or frequency. No flank pain.  Musculoskeletal: No joint pain or swelling. No decreased range of motion. No back pain.  Psych: No change in mood or affect. No depression or anxiety. No memory loss.    Past Medical History  Diagnosis Date  .  Sickle cell anemia    Past Surgical History  Procedure Laterality Date  . Tubal ligation    . Cholecystectomy    . Cesarean section    . Port a cath placement Right     about 6-7 years ago  . Cholecystectomy  2000   Social History:  reports that she has quit smoking. She does not have any smokeless tobacco history on file. She reports that she does not drink alcohol or use illicit drugs.  Allergies  Allergen Reactions  . Ultram [Tramadol] Other (See Comments)    seizures  . Morphine And Related Hives, Rash and Other (See Comments)    shaking    Family History  Problem Relation Age of Onset  . Sickle cell anemia Other   . Sickle cell trait Father   . Sickle cell trait Mother     Prior to Admission medications   Medication Sig Start Date End Date Taking? Authorizing Provider  folic acid (FOLVITE) 1 MG tablet Take 1 tablet (1 mg total) by mouth daily. 02/16/14  Yes [name], FNP  gabapentin (NEURONTIN) 300 MG capsule Take 1 capsule (300 mg total) by mouth 3 (three) times daily. 02/14/14  Yes Altha Harm, MD  hydroxyurea (HYDREA) 500 MG capsule Take 1 capsule (500 mg total) by mouth daily. May take with food to minimize GI side effects. 02/14/14  Yes Altha Harm, MD  Naproxen Sodium (ALEVE) 220 MG CAPS Take 660 mg by mouth every 6 (six) hours as needed (pain).   Yes Historical Provider, MD  oxyCODONE-acetaminophen (PERCOCET) 10-325 MG per tablet Take 1 tablet by mouth every 4 (four) hours as needed for pain. 04/10/14  Yes Altha HarmMichelle A Jayvion Stefanski, MD  Topiramate ER 100 MG CP24 Take 100 mg by mouth at bedtime.   Yes Historical Provider, MD   Physical Exam: Filed Vitals:   04/24/14 0818 04/24/14 1057 04/24/14 1250 04/24/14 1328  BP: 121/67 100/60 113/72 104/69  Pulse: 87 80 102 102  Temp: 97.8 F (36.6 C)   98.9 F (37.2 C)  TempSrc: Oral   Oral  Resp: 16 16 17 18   SpO2: 98% 100% 100% 100%   General: Alert, awake, oriented x3, in no acute distress.  HEENT:  [place]/AT PEERL, EOMI, anicteric Neck: Trachea midline,  no masses, no thyromegal,y no JVD, no carotid bruit OROPHARYNX:  Moist, No exudate/ erythema/lesions.  Heart: Regular rate and rhythm, without murmurs, rubs, gallops, PMI non-displaced, no heaves or thrills on palpation.  Lungs: Clear to auscultation, no wheezing or rhonchi noted. No increased vocal fremitus resonant to percussion  Abdomen: Soft, nontender, nondistended, positive bowel sounds, no masses no hepatosplenomegaly noted..  Neuro: No focal neurological deficits noted cranial nerves II through XII grossly intact.   Labs on Admission:   Basic Metabolic Panel:  Recent Labs Lab 04/19/14 1104 04/24/14 0924  NA 137 141  K 3.4* 3.5  CL 110 114*  CO2 23 24  GLUCOSE 100* 111*  BUN 6 9  CREATININE 0.35* 0.44*  CALCIUM 8.8 8.5   Liver Function Tests:  Recent Labs Lab 04/19/14 1104  AST 46*  ALT 29  ALKPHOS 105  BILITOT 2.7*  PROT 7.6  ALBUMIN 4.1   CBC:  Recent Labs Lab 04/19/14 1104 04/24/14 0924  WBC 12.2* 11.0*  NEUTROABS 8.3* 7.1  HGB 9.0* 8.2*  HCT 26.4* 24.1*  MCV 103.9* 103.4*  PLT 302 334   BNP: Invalid input(s): POCBNP   Radiological Exams on Admission: Dg Chest Port 1 View  04/24/2014   CLINICAL DATA:  Sickle cell disease with chest pain and nausea  EXAM: PORTABLE CHEST - 1 VIEW  COMPARISON:  April 06, 2014  FINDINGS: Port-A-Cath present with tip in right atrium. No pneumothorax. Heart is borderline enlarged with pulmonary vascularity within normal limits. No lung edema or consolidation. No adenopathy. No bone lesions appreciable.  IMPRESSION: Heart borderline enlarged without edema or consolidation. No adenopathy.   Electronically Signed   By: Bretta BangWilliam  Woodruff III M.D.   On: 04/24/2014 10:57    EKG: Independently reviewed. Reviewed and shows no evidence of ACS or pericarditis.  Assessment/Plan: Active Problems: Pt definitely has chronic pan and is using her PRN medication on an almost  continual basis. I am not sure that she is currently in crisis or just out of her medications. However she is able to ambulate without difficulty and she has no clinical signs that suggest either Acute Chest syndrome or Pericarditis. I will treat eith oral Dilaudid,  IVF and Toradol . If pain is above 7/10 after 2 doses  then Pt will be transitioned to IV Dilaudid Dilaudid and a PCA ifnecessary . Patient will be re-evaluated for pain in the context of function and relationship to baseline as care progresses.     Time spend: 30 minutes Code Status: Full Code Family Communication: N/A Disposition Plan: Likely Home  Josselyne Onofrio A., MD  Pager [phone]  If 7PM-7AM, please contact night-coverage [email or web address] Password [email or web address] 04/24/2014, 3:07 PM

## 2014-04-24 NOTE — ED Provider Notes (Signed)
CSN: [id number]     Arrival date & time 04/24/14  [id number] History   First MD Initiated Contact with Patient 04/24/14 0827     Chief Complaint  Patient presents with  . Chest Pain  . Abdominal Pain  . Back Pain     (Consider location/radiation/quality/duration/timing/severity/associated sxs/prior Treatment) Patient is a 29 y.o. female presenting with chest pain, abdominal pain, and back pain. The history is provided by the patient (the pt complains of chest and abd pain from sickle cell crisis).  Chest Pain Pain location:  L chest Pain quality: aching   Pain radiates to:  Does not radiate Pain radiates to the back: no   Pain severity:  Moderate Onset quality:  Sudden Timing:  Constant Progression:  Waxing and waning Chronicity:  New Context: not breathing   Associated symptoms: abdominal pain and back pain   Associated symptoms: no cough, no fatigue and no headache   Abdominal Pain Associated symptoms: chest pain   Associated symptoms: no cough, no diarrhea, no fatigue and no hematuria   Back Pain Associated symptoms: abdominal pain and chest pain   Associated symptoms: no headaches     Past Medical History  Diagnosis Date  . Sickle cell anemia    Past Surgical History  Procedure Laterality Date  . Tubal ligation    . Cholecystectomy    . Cesarean section    . Port a cath placement Right     about 6-7 years ago  . Cholecystectomy  2000   Family History  Problem Relation Age of Onset  . Sickle cell anemia Other   . Sickle cell trait Father   . Sickle cell trait Mother    History  Substance Use Topics  . Smoking status: Former Games developermoker  . Smokeless tobacco: Not on file     Comment: smokes once every couple of weeks.   . Alcohol Use: No   OB History    No data available     Review of Systems  Constitutional: Negative for appetite change and fatigue.  HENT: Negative for congestion, ear discharge and sinus pressure.   Eyes: Negative for discharge.   Respiratory: Negative for cough.   Cardiovascular: Positive for chest pain.  Gastrointestinal: Positive for abdominal pain. Negative for diarrhea.  Genitourinary: Negative for frequency and hematuria.  Musculoskeletal: Positive for back pain.  Skin: Negative for rash.  Neurological: Negative for seizures and headaches.  Psychiatric/Behavioral: Negative for hallucinations.      Allergies  Ultram and Morphine and related  Home Medications   Prior to Admission medications   Medication Sig Start Date End Date Taking? Authorizing Provider  folic acid (FOLVITE) 1 MG tablet Take 1 tablet (1 mg total) by mouth daily. 02/16/14  Yes Massie MaroonLachina M [name], FNP  gabapentin (NEURONTIN) 300 MG capsule Take 1 capsule (300 mg total) by mouth 3 (three) times daily. 02/14/14  Yes Altha HarmMichelle A [name], MD  hydroxyurea (HYDREA) 500 MG capsule Take 1 capsule (500 mg total) by mouth daily. May take with food to minimize GI side effects. 02/14/14  Yes Altha HarmMichelle A [name], MD  Naproxen Sodium (ALEVE) 220 MG CAPS Take 660 mg by mouth every 6 (six) hours as needed (pain).   Yes Historical Provider, MD  oxyCODONE-acetaminophen (PERCOCET) 10-325 MG per tablet Take 1 tablet by mouth every 4 (four) hours as needed for pain. 04/10/14  Yes Altha HarmMichelle A [name], MD  Topiramate ER 100 MG CP24 Take 100 mg by mouth at bedtime.   Yes Historical Provider,  MD   BP 100/60 mmHg  Pulse 80  Temp(Src) 97.8 F (36.6 C) (Oral)  Resp 16  SpO2 100% Physical Exam  Constitutional: She is oriented to person, place, and time. She appears well-developed.  HENT:  Head: Normocephalic.  Eyes: Conjunctivae and EOM are normal. No scleral icterus.  Neck: Neck supple. No thyromegaly present.  Cardiovascular: Normal rate and regular rhythm.  Exam reveals no gallop and no friction rub.   No murmur heard. Pulmonary/Chest: No stridor. She has no wheezes. She has no rales. She exhibits no tenderness.  Abdominal: She exhibits no distension. There  is no tenderness. There is no rebound.  Musculoskeletal: Normal range of motion. She exhibits no edema.  Lymphadenopathy:    She has no cervical adenopathy.  Neurological: She is oriented to person, place, and time. She exhibits normal muscle tone. Coordination normal.  Skin: No rash noted. No erythema.  Psychiatric: She has a normal mood and affect. Her behavior is normal.    ED Course  Procedures (including critical care time) Labs Review Labs Reviewed  CBC WITH DIFFERENTIAL/PLATELET - Abnormal; Notable for the following:    WBC 11.0 (*)    RBC 2.33 (*)    Hemoglobin 8.2 (*)    HCT 24.1 (*)    MCV 103.4 (*)    MCH 35.2 (*)    RDW 17.7 (*)    All other components within normal limits  BASIC METABOLIC PANEL - Abnormal; Notable for the following:    Chloride 114 (*)    Glucose, Bld 111 (*)    Creatinine, Ser 0.44 (*)    Anion gap 3 (*)    All other components within normal limits  RETICULOCYTES - Abnormal; Notable for the following:    Retic Ct Pct 14.6 (*)    RBC. 2.33 (*)    Retic Count, Manual 340.2 (*)    All other components within normal limits  URINALYSIS, ROUTINE W REFLEX MICROSCOPIC - Abnormal; Notable for the following:    Urobilinogen, UA 2.0 (*)    All other components within normal limits  TROPONIN I  PREGNANCY, URINE    Imaging Review Dg Chest Port 1 View  04/24/2014   CLINICAL DATA:  Sickle cell disease with chest pain and nausea  EXAM: PORTABLE CHEST - 1 VIEW  COMPARISON:  April 06, 2014  FINDINGS: Port-A-Cath present with tip in right atrium. No pneumothorax. Heart is borderline enlarged with pulmonary vascularity within normal limits. No lung edema or consolidation. No adenopathy. No bone lesions appreciable.  IMPRESSION: Heart borderline enlarged without edema or consolidation. No adenopathy.   Electronically Signed   By: Bretta Bang III M.D.   On: 04/24/2014 10:57     EKG Interpretation   Date/Time:  Tuesday April 24 2014 08:07:01  EST Ventricular Rate:  98 PR Interval:  155 QRS Duration: 85 QT Interval:  356 QTC Calculation: 454 R Axis:   58 Text Interpretation:  Sinus rhythm RSR' in V1 or V2, right VCD or RVH  Confirmed by Shawnise Peterkin  MD, Arionne Iams [phone]) on 04/24/2014 8:13:37 AM      MDM   Final diagnoses:  Pain  Sickle cell crisis    Sickle cell crisis.  Pt not improved with pain meds.  Spoke with dr. Ashley Royalty and the pt will be transferred to sickle area    Benny Lennert, MD 04/24/14 [phone]

## 2014-04-24 NOTE — ED Notes (Signed)
Patient is unable to urinate at this time 

## 2014-04-25 LAB — HEMOGLOBINOPATHY EVALUATION
Hemoglobin Other: 0 %
Hgb A2 Quant: 3.3 % — ABNORMAL HIGH (ref 2.2–3.2)
Hgb A: 23.8 % — ABNORMAL LOW (ref 96.8–97.8)
Hgb F Quant: 7 % — ABNORMAL HIGH (ref 0.0–2.0)
Hgb S Quant: 65.9 % — ABNORMAL HIGH

## 2014-04-25 LAB — HIV ANTIBODY (ROUTINE TESTING W REFLEX): HIV 1&2 Ab, 4th Generation: NONREACTIVE

## 2014-04-25 LAB — VITAMIN D 25 HYDROXY (VIT D DEFICIENCY, FRACTURES): Vit D, 25-Hydroxy: 8 ng/mL — ABNORMAL LOW (ref 30–100)

## 2014-04-25 LAB — RPR

## 2014-04-25 NOTE — Discharge Summary (Signed)
Physician Discharge Summary  Brittany Foster ZOX:[id number] DOB: 11/08/1985 DOA: 04/24/2014  PCP: Altha HarmMATTHEWS,[name], MD  Admit date: 04/24/2014 Discharge date: 04/25/2014  Discharge Diagnoses:  Active Problems:   Hb-SS disease with crisis   Discharge Condition: Good  Disposition:  Home Follow-up Information    Follow up with [name], MD On 05/17/2014.   Specialty:  Internal Medicine   Contact information:   [name] [place] [address] [phone]       Diet: Regular  Wt Readings from Last 3 Encounters:  04/19/14 112 lb (50.803 kg)  04/10/14 117 lb (53.071 kg)  03/28/14 119 lb 6.4 oz (54.159 kg)    History of present illness:  Pt with Hb SS [facility] well known to me presented to the ED this morning after having pain for 4 days. [facility] 48 [facility]. Pt reports that she attempted to manage with her oral medications but was unsuccessful in his attempts to control pain and she is now out of her medications but did not call to request them.Her pain is localized to the chest wall and she rates pain as 6/10. The pain is described as sharp and at times throbbing. The pain is not associated with any other symptoms .She denies any SOB, dizziness, pre-syncope. Fevers, chills N/V/D. [facility] management of early acute sickle cell crisis.  Hospital Course:  My assessment after evaluating and speaking with the patient was that she was having an increase in pain not because she was having a crisis but rather because she was out of her chronic pain medication. Thus she was treated with oral analgesics and IVF and imporved enough that her function was not impacted. She was given a prescription for her Oxycodone and was discharged home.   On the description of her chest wall pain I had some concern that she might be having  pericarditis. However a review of her EKG showed no changes consistent with Pericarditis and was in fact normal and she had no evidence of pericardial rub.   Discharge Exam:  Filed Vitals:   04/24/14 1328  BP: 104/69  Pulse: 102  Temp: 98.9 F (37.2 C)  Resp: 18   Filed Vitals:   04/24/14 0818 04/24/14 1057 04/24/14 1250 04/24/14 1328  BP: 121/67 100/60 113/72 104/69  Pulse: 87 80 102 102  Temp: 97.8 F (36.6 C)   98.9 F (37.2 C)  TempSrc: Oral   Oral  Resp: 16 16 17 18   SpO2: 98% 100% 100% 100%     General: Alert, awake, oriented x3, in no acute distress.  HEENT: [place]/AT PEERL, EOMI, anicteric Neck: Trachea midline,  no masses, no thyromegal,y no JVD, no carotid bruit OROPHARYNX:  Moist, No exudate/ erythema/lesions.  Heart: Regular rate and rhythm, without murmurs, rubs, gallops, PMI non-displaced, no heaves or thrills on palpation.  Lungs: Clear to auscultation, no wheezing or rhonchi noted. No increased vocal fremitus resonant to percussion  Abdomen: Soft, nontender, nondistended, positive bowel sounds, no masses no hepatosplenomegaly noted..  Neuro: No focal neurological deficits noted cranial nerves II through XII grossly intact. Strength normal in bilateral upper and lower extremities. Musculoskeletal: No warm swelling or erythema around joints, no spinal tenderness noted. Psychiatric: Patient alert and oriented x3, good insight and cognition, good recent to remote recall.    Discharge Instructions  Discharge Instructions  Activity as tolerated - No restrictions    Complete by:  As directed      Diet general    Complete by:  As directed             Medication List    TAKE these medications        ALEVE 220 MG Caps  Generic drug:  Naproxen Sodium  Take 660 mg by mouth every 6 (six) hours as needed (pain).     folic acid 1 MG tablet  Commonly known as:  FOLVITE  Take 1 tablet (1 mg total) by mouth daily.     gabapentin 300 MG capsule  Commonly known  as:  NEURONTIN  Take 1 capsule (300 mg total) by mouth 3 (three) times daily.     hydroxyurea 500 MG capsule  Commonly known as:  HYDREA  Take 1 capsule (500 mg total) by mouth daily. May take with food to minimize GI side effects.     oxyCODONE-acetaminophen 10-325 MG per tablet  Commonly known as:  PERCOCET  Take 1 tablet by mouth every 4 (four) hours as needed for pain.     Topiramate ER 100 MG Cp24  Take 100 mg by mouth at bedtime.          The results of significant diagnostics from this hospitalization (including imaging, microbiology, ancillary and laboratory) are listed below for reference.    Significant Diagnostic Studies: Dg Chest 2 View  04/06/2014   CLINICAL DATA:  Chest pain extending to left shoulder. Shortness of breath. Sickle cell disease.  EXAM: CHEST - 2 VIEW  COMPARISON:  CTA chest 03/18/2014.  One-view chest 03/18/2014.  FINDINGS: The heart is mildly enlarged. Mild interstitial coarsening is stable. A right IJ Port-A-Cath is stable in position. The tip of the catheter is in the right atrium, stable. The catheter fragment in the innominate vein is stable. No focal airspace disease is present. Vertebral endplate changes are compatible with sickle cell disease.  IMPRESSION: 1. Stable cardiomegaly and chronic interstitial coarsening. 2. No superimposed acute cardiopulmonary disease. 3. The tip of the right IJ Port-A-Cath is in the right atrium.   Electronically Signed   By: Gennette Pac M.D.   On: 04/06/2014 14:55   Dg Chest Port 1 View  04/24/2014   CLINICAL DATA:  Sickle cell disease with chest pain and nausea  EXAM: PORTABLE CHEST - 1 VIEW  COMPARISON:  April 06, 2014  FINDINGS: Port-A-Cath present with tip in right atrium. No pneumothorax. Heart is borderline enlarged with pulmonary vascularity within normal limits. No lung edema or consolidation. No adenopathy. No bone lesions appreciable.  IMPRESSION: Heart borderline enlarged without edema or consolidation. No  adenopathy.   Electronically Signed   By: Bretta Bang III M.D.   On: 04/24/2014 10:57    Microbiology: No results found for this or any previous visit (from the past 240 hour(s)).   Labs: Basic Metabolic Panel:  Recent Labs Lab 04/19/14 1104 04/24/14 0924  NA 137 141  K 3.4* 3.5  CL 110 114*  CO2 23 24  GLUCOSE 100* 111*  BUN 6 9  CREATININE 0.35* 0.44*  CALCIUM 8.8 8.5   Liver Function Tests:  Recent Labs Lab 04/19/14 1104  AST 46*  ALT 29  ALKPHOS 105  BILITOT 2.7*  PROT 7.6  ALBUMIN 4.1   No results for input(s): LIPASE, AMYLASE in the last 168 hours. No results for input(s): AMMONIA in the last 168 hours. CBC:  Recent Labs Lab  04/19/14 1104 04/24/14 0924  WBC 12.2* 11.0*  NEUTROABS 8.3* 7.1  HGB 9.0* 8.2*  HCT 26.4* 24.1*  MCV 103.9* 103.4*  PLT 302 334   Cardiac Enzymes:  Recent Labs Lab 04/24/14 0924  TROPONINI <0.03   BNP: Invalid input(s): POCBNP CBG: No results for input(s): GLUCAP in the last 168 hours. Ferritin: No results for input(s): FERRITIN in the last 168 hours.  Time coordinating discharge: 35 minutes  Signed:  Danene Montijo A.  04/25/2014, 5:06 PM

## 2014-04-29 ENCOUNTER — Encounter (HOSPITAL_COMMUNITY): Payer: Self-pay

## 2014-04-29 ENCOUNTER — Emergency Department (HOSPITAL_COMMUNITY): Payer: Medicaid Other

## 2014-04-29 ENCOUNTER — Inpatient Hospital Stay (HOSPITAL_COMMUNITY)
Admission: EM | Admit: 2014-04-29 | Discharge: 2014-05-01 | DRG: 812 | Disposition: A | Discharge status: Home / Self Care | Payer: Medicaid Other | Attending: Internal Medicine | Admitting: Internal Medicine

## 2014-04-29 DIAGNOSIS — G43909 Migraine, unspecified, not intractable, without status migrainosus: Secondary | ICD-10-CM | POA: Diagnosis present

## 2014-04-29 DIAGNOSIS — R0789 Other chest pain: Secondary | ICD-10-CM | POA: Diagnosis present

## 2014-04-29 DIAGNOSIS — Z9049 Acquired absence of other specified parts of digestive tract: Secondary | ICD-10-CM | POA: Diagnosis present

## 2014-04-29 DIAGNOSIS — Z87891 Personal history of nicotine dependence: Secondary | ICD-10-CM | POA: Diagnosis not present

## 2014-04-29 DIAGNOSIS — G8929 Other chronic pain: Secondary | ICD-10-CM | POA: Diagnosis present

## 2014-04-29 DIAGNOSIS — Z79899 Other long term (current) drug therapy: Secondary | ICD-10-CM

## 2014-04-29 DIAGNOSIS — D5700 Hb-SS disease with crisis, unspecified: Secondary | ICD-10-CM | POA: Diagnosis present

## 2014-04-29 DIAGNOSIS — E876 Hypokalemia: Secondary | ICD-10-CM | POA: Diagnosis present

## 2014-04-29 DIAGNOSIS — R079 Chest pain, unspecified: Secondary | ICD-10-CM | POA: Diagnosis present

## 2014-04-29 LAB — COMPREHENSIVE METABOLIC PANEL
ALT: 28 U/L (ref 0–35)
AST: 45 U/L — ABNORMAL HIGH (ref 0–37)
Albumin: 3.8 g/dL (ref 3.5–5.2)
Alkaline Phosphatase: 101 U/L (ref 39–117)
Anion gap: 5 (ref 5–15)
BUN: <5 mg/dL — ABNORMAL LOW (ref 6–23)
CALCIUM: 8.7 mg/dL (ref 8.4–10.5)
CO2: 24 mmol/L (ref 19–32)
Chloride: 112 mmol/L (ref 96–112)
Creatinine, Ser: 0.38 mg/dL — ABNORMAL LOW (ref 0.50–1.10)
GFR calc non Af Amer: >90 mL/min (ref >90)
Glucose, Bld: 128 mg/dL — ABNORMAL HIGH (ref 70–99)
Potassium: 3.1 mmol/L — ABNORMAL LOW (ref 3.5–5.1)
Sodium: 141 mmol/L (ref 135–145)
TOTAL PROTEIN: 6.9 g/dL (ref 6.0–8.3)
Total Bilirubin: 2 mg/dL — ABNORMAL HIGH (ref 0.3–1.2)

## 2014-04-29 LAB — RETICULOCYTES
RBC.: 2.27 MIL/uL — AB (ref 3.87–5.11)
Retic Ct Pct: >23 % — ABNORMAL HIGH (ref 0.4–3.1)

## 2014-04-29 LAB — RAPID URINE DRUG SCREEN, HOSP PERFORMED
Amphetamines: NOT DETECTED
BARBITURATES: NOT DETECTED
BENZODIAZEPINES: NOT DETECTED
Cocaine: NOT DETECTED
Opiates: POSITIVE — AB
Tetrahydrocannabinol: NOT DETECTED

## 2014-04-29 LAB — URINALYSIS, ROUTINE W REFLEX MICROSCOPIC
BILIRUBIN URINE: NEGATIVE
Glucose, UA: NEGATIVE mg/dL
Ketones, ur: NEGATIVE mg/dL
Leukocytes, UA: NEGATIVE
Nitrite: NEGATIVE
PH: 6.5 (ref 5.0–8.0)
Protein, ur: NEGATIVE mg/dL
Specific Gravity, Urine: 1.013 (ref 1.005–1.030)
Urobilinogen, UA: 1 mg/dL (ref 0.0–1.0)

## 2014-04-29 LAB — URINE MICROSCOPIC-ADD ON

## 2014-04-29 LAB — LIPASE, BLOOD: LIPASE: 23 U/L (ref 11–59)

## 2014-04-29 LAB — BRAIN NATRIURETIC PEPTIDE: B Natriuretic Peptide: 114.5 pg/mL — ABNORMAL HIGH (ref 0.0–100.0)

## 2014-04-29 LAB — PROTIME-INR
INR: 1.13 (ref 0.00–1.49)
PROTHROMBIN TIME: 14.7 s (ref 11.6–15.2)

## 2014-04-29 LAB — I-STAT TROPONIN, ED: Troponin i, poc: 0 ng/mL (ref 0.00–0.08)

## 2014-04-29 LAB — TROPONIN I

## 2014-04-29 LAB — CBC
HEMATOCRIT: 23.9 % — AB (ref 36.0–46.0)
HEMOGLOBIN: 8.3 g/dL — AB (ref 12.0–15.0)
MCH: 36.6 pg — ABNORMAL HIGH (ref 26.0–34.0)
MCHC: 34.7 g/dL (ref 30.0–36.0)
MCV: 105.3 fL — ABNORMAL HIGH (ref 78.0–100.0)
Platelets: 327 10*3/uL (ref 150–400)
RBC: 2.27 MIL/uL — ABNORMAL LOW (ref 3.87–5.11)
RDW: 21.7 % — ABNORMAL HIGH (ref 11.5–15.5)
WBC: 11.1 10*3/uL — ABNORMAL HIGH (ref 4.0–10.5)

## 2014-04-29 LAB — POC URINE PREG, ED: Preg Test, Ur: NEGATIVE

## 2014-04-29 MED ORDER — POTASSIUM CHLORIDE CRYS ER 20 MEQ PO TBCR
40.0000 meq | EXTENDED_RELEASE_TABLET | Freq: Once | ORAL | Status: AC
  Administered 2014-04-29: 40 meq via ORAL
  Filled 2014-04-29: qty 2

## 2014-04-29 MED ORDER — HYDROMORPHONE HCL 2 MG/ML IJ SOLN
2.0000 mg | INTRAMUSCULAR | Status: AC | PRN
  Administered 2014-04-29 (×2): 2 mg via INTRAVENOUS
  Filled 2014-04-29 (×2): qty 1

## 2014-04-29 MED ORDER — ONDANSETRON HCL 4 MG/2ML IJ SOLN
4.0000 mg | INTRAMUSCULAR | Status: DC | PRN
  Administered 2014-04-29: 4 mg via INTRAVENOUS
  Filled 2014-04-29: qty 2

## 2014-04-29 MED ORDER — NALOXONE HCL 0.4 MG/ML IJ SOLN: 0.4000 mg | INTRAMUSCULAR | Status: DC | PRN

## 2014-04-29 MED ORDER — SODIUM CHLORIDE 0.9 % IV SOLN
INTRAVENOUS | Status: DC
  Administered 2014-04-29 – 2014-04-30 (×3): via INTRAVENOUS
  Filled 2014-04-29 (×4): qty 1000

## 2014-04-29 MED ORDER — HYDROMORPHONE 0.3 MG/ML IV SOLN
INTRAVENOUS | Status: DC
Start: 2014-04-29 — End: 2014-04-30
  Administered 2014-04-29: 0.3 mg via INTRAVENOUS
  Administered 2014-04-29: 5.2 mg via INTRAVENOUS
  Administered 2014-04-30: 3.9 mg via INTRAVENOUS
  Administered 2014-04-30: 09:00:00 via INTRAVENOUS
  Administered 2014-04-30: 3.6 mg via INTRAVENOUS
  Administered 2014-04-30: 1.2 mg via INTRAVENOUS
  Filled 2014-04-29 (×3): qty 25

## 2014-04-29 MED ORDER — ONDANSETRON HCL 4 MG PO TABS: 4.0000 mg | ORAL_TABLET | Freq: Four times a day (QID) | ORAL | Status: DC | PRN

## 2014-04-29 MED ORDER — KETOROLAC TROMETHAMINE 30 MG/ML IJ SOLN
30.0000 mg | Freq: Once | INTRAMUSCULAR | Status: AC
  Administered 2014-04-29: 30 mg via INTRAVENOUS
  Filled 2014-04-29: qty 1

## 2014-04-29 MED ORDER — ONDANSETRON HCL 4 MG/2ML IJ SOLN: 4.0000 mg | Freq: Four times a day (QID) | INTRAMUSCULAR | Status: DC | PRN

## 2014-04-29 MED ORDER — GABAPENTIN 300 MG PO CAPS
300.0000 mg | ORAL_CAPSULE | Freq: Three times a day (TID) | ORAL | Status: DC
  Administered 2014-04-29 – 2014-04-30 (×4): 300 mg via ORAL
  Filled 2014-04-29 (×5): qty 1

## 2014-04-29 MED ORDER — TOPIRAMATE 25 MG PO TABS
50.0000 mg | ORAL_TABLET | Freq: Two times a day (BID) | ORAL | Status: DC
  Administered 2014-04-29 – 2014-04-30 (×3): 50 mg via ORAL
  Filled 2014-04-29 (×6): qty 2

## 2014-04-29 MED ORDER — DIPHENHYDRAMINE HCL 12.5 MG/5ML PO ELIX
12.5000 mg | ORAL_SOLUTION | Freq: Four times a day (QID) | ORAL | Status: DC | PRN
  Filled 2014-04-29: qty 5

## 2014-04-29 MED ORDER — HYDROMORPHONE HCL 2 MG PO TABS
2.0000 mg | ORAL_TABLET | Freq: Once | ORAL | Status: AC
  Administered 2014-04-29: 2 mg via ORAL
  Filled 2014-04-29: qty 1

## 2014-04-29 MED ORDER — SODIUM CHLORIDE 0.9 % IJ SOLN: 9.0000 mL | INTRAMUSCULAR | Status: DC | PRN

## 2014-04-29 MED ORDER — HYDROXYUREA 500 MG PO CAPS
500.0000 mg | ORAL_CAPSULE | Freq: Every day | ORAL | Status: DC
  Administered 2014-04-30: 500 mg via ORAL
  Filled 2014-04-29 (×2): qty 1

## 2014-04-29 MED ORDER — FAMOTIDINE IN NACL 20-0.9 MG/50ML-% IV SOLN
20.0000 mg | Freq: Two times a day (BID) | INTRAVENOUS | Status: DC
  Administered 2014-04-29: 20 mg via INTRAVENOUS
  Filled 2014-04-29 (×6): qty 50

## 2014-04-29 MED ORDER — ACETAMINOPHEN 325 MG PO TABS: 650.0000 mg | ORAL_TABLET | Freq: Four times a day (QID) | ORAL | Status: DC | PRN

## 2014-04-29 MED ORDER — TOPIRAMATE ER 100 MG PO CAP24: 100.0000 mg | ORAL_CAPSULE | Freq: Every day | ORAL | Status: DC

## 2014-04-29 MED ORDER — ENOXAPARIN SODIUM 30 MG/0.3ML ~~LOC~~ SOLN
30.0000 mg | SUBCUTANEOUS | Status: DC
  Filled 2014-04-29: qty 0.3

## 2014-04-29 MED ORDER — KETOROLAC TROMETHAMINE 15 MG/ML IJ SOLN
15.0000 mg | Freq: Four times a day (QID) | INTRAMUSCULAR | Status: DC
  Administered 2014-04-29 – 2014-05-01 (×4): 15 mg via INTRAVENOUS
  Filled 2014-04-29 (×6): qty 1

## 2014-04-29 MED ORDER — SODIUM CHLORIDE 0.9 % IV SOLN
1000.0000 mL | INTRAVENOUS | Status: DC
  Administered 2014-04-29: 1000 mL via INTRAVENOUS

## 2014-04-29 MED ORDER — DIPHENHYDRAMINE HCL 50 MG/ML IJ SOLN
12.5000 mg | Freq: Four times a day (QID) | INTRAMUSCULAR | Status: DC | PRN
  Administered 2014-04-29 – 2014-05-01 (×6): 12.5 mg via INTRAVENOUS
  Filled 2014-04-29 (×6): qty 1

## 2014-04-29 MED ORDER — ACETAMINOPHEN 650 MG RE SUPP: 650.0000 mg | Freq: Four times a day (QID) | RECTAL | Status: DC | PRN

## 2014-04-29 MED ORDER — PROMETHAZINE HCL 25 MG PO TABS
25.0000 mg | ORAL_TABLET | Freq: Four times a day (QID) | ORAL | Status: DC | PRN
Start: 2014-04-29 — End: 2014-05-01

## 2014-04-29 MED ORDER — FOLIC ACID 1 MG PO TABS
1.0000 mg | ORAL_TABLET | Freq: Every day | ORAL | Status: DC
  Administered 2014-04-30: 1 mg via ORAL
  Filled 2014-04-29 (×2): qty 1

## 2014-04-29 MED ORDER — SODIUM CHLORIDE 0.9 % IJ SOLN
10.0000 mL | INTRAMUSCULAR | Status: DC | PRN
  Administered 2014-04-30: 20 mL
  Administered 2014-05-01 (×2): 10 mL
  Filled 2014-04-29 (×3): qty 40

## 2014-04-29 MED ORDER — DIPHENHYDRAMINE HCL 50 MG/ML IJ SOLN
12.5000 mg | INTRAMUSCULAR | Status: DC | PRN
  Administered 2014-04-29: 12.5 mg via INTRAVENOUS
  Filled 2014-04-29: qty 1

## 2014-04-29 NOTE — ED Notes (Signed)
Pt c/o Sickle Cell pain to chest, upper back, and bilateral legs since last night.  Pain score 8/10.  Sts n/v and lightheadedness w/ chest pain.  Pt reports taking prescribed medication w/o relief.  [facility] x 5 days ago.

## 2014-04-29 NOTE — ED Notes (Signed)
Pt refused blood draw by phlebotomy.  Sts "I have a port."

## 2014-04-29 NOTE — ED Provider Notes (Signed)
CSN: [id number]     Arrival date & time 04/29/14  1236 History   First MD Initiated Contact with Patient 04/29/14 1500     Chief Complaint  Patient presents with  . Chest Pain  . Sickle Cell Pain Crisis    Patient is a 29 y.o. female presenting with sickle cell pain. The history is provided by the patient.  Sickle Cell Pain Crisis Location:  Chest, back, hip and lower extremity (Right side of chest to the left) Severity:  Severe Onset quality:  Gradual Duration:  1 day Similar to previous crisis episodes: yes (although the chest pain is a little different)   Timing:  Constant Progression:  Worsening Frequency of attacks:  Several times per month to the ED History of pulmonary emboli: no   Context: not infection   Relieved by:  Nothing Ineffective treatments:  Prescription drugs Associated symptoms: chest pain, nausea, shortness of breath and vomiting (once last night , none today )   Associated symptoms: no cough, no fever and no swelling of legs     Past Medical History  Diagnosis Date  . Sickle cell anemia    Past Surgical History  Procedure Laterality Date  . Tubal ligation    . Cholecystectomy    . Cesarean section    . Port a cath placement Right     about 6-7 years ago  . Cholecystectomy  2000   Family History  Problem Relation Age of Onset  . Sickle cell anemia Other   . Sickle cell trait Father   . Sickle cell trait Mother    History  Substance Use Topics  . Smoking status: Former Games developermoker  . Smokeless tobacco: Not on file     Comment: smokes once every couple of weeks.   . Alcohol Use: No   OB History    No data available     Review of Systems  Constitutional: Negative for fever.  Respiratory: Positive for shortness of breath. Negative for cough.   Cardiovascular: Positive for chest pain.  Gastrointestinal: Positive for nausea and vomiting (once last night , none today ).  All other systems reviewed and are negative.     Allergies  Ultram and  Morphine and related  Home Medications   Prior to Admission medications   Medication Sig Start Date End Date Taking? Authorizing Provider  folic acid (FOLVITE) 1 MG tablet Take 1 tablet (1 mg total) by mouth daily. 02/16/14  Yes Massie MaroonLachina M [name], FNP  gabapentin (NEURONTIN) 300 MG capsule Take 1 capsule (300 mg total) by mouth 3 (three) times daily. 02/14/14  Yes Altha HarmMichelle A [name], MD  hydroxyurea (HYDREA) 500 MG capsule Take 1 capsule (500 mg total) by mouth daily. May take with food to minimize GI side effects. 02/14/14  Yes Altha HarmMichelle A [name], MD  Naproxen Sodium (ALEVE) 220 MG CAPS Take 660 mg by mouth every 6 (six) hours as needed (pain).   Yes Historical Provider, MD  oxyCODONE-acetaminophen (PERCOCET) 10-325 MG per tablet Take 1 tablet by mouth every 4 (four) hours as needed for pain. 04/24/14  Yes Altha HarmMichelle A [name], MD  Topiramate ER 100 MG CP24 Take 100 mg by mouth at bedtime.   Yes Historical Provider, MD   BP 100/69 mmHg  Pulse 9  Temp(Src) 98.3 F (36.8 C) (Oral)  Resp 16  SpO2 97%  LMP 04/02/2014 Physical Exam  Constitutional: She appears well-developed and well-nourished. No distress.  HENT:  Head: Normocephalic and atraumatic.  Right Ear: External ear  normal.  Left Ear: External ear normal.  Eyes: Conjunctivae are normal. Right eye exhibits no discharge. Left eye exhibits no discharge. No scleral icterus.  Neck: Neck supple. No tracheal deviation present.  Cardiovascular: Normal rate, regular rhythm and intact distal pulses.   Pulmonary/Chest: Effort normal and breath sounds normal. No stridor. No respiratory distress. She has no wheezes. She has no rales.  Abdominal: Soft. Bowel sounds are normal. She exhibits no distension. There is no tenderness. There is no rebound and no guarding.  Musculoskeletal: She exhibits no edema or tenderness.  Neurological: She is alert. She has normal strength. No cranial nerve deficit (no facial droop, extraocular movements intact,  no slurred speech) or sensory deficit. She exhibits normal muscle tone. She displays no seizure activity. Coordination normal.  Skin: Skin is warm and dry. No rash noted.  Psychiatric: She has a normal mood and affect.  Nursing note and vitals reviewed.   ED Course  Procedures (including critical care time) Labs Review Labs Reviewed  CBC - Abnormal; Notable for the following:    WBC 11.1 (*)    RBC 2.27 (*)    Hemoglobin 8.3 (*)    HCT 23.9 (*)    MCV 105.3 (*)    MCH 36.6 (*)    RDW 21.7 (*)    All other components within normal limits  BRAIN NATRIURETIC PEPTIDE - Abnormal; Notable for the following:    B Natriuretic Peptide 114.5 (*)    All other components within normal limits  RETICULOCYTES - Abnormal; Notable for the following:    Retic Ct Pct >23.0 (*)    RBC. 2.27 (*)    All other components within normal limits  COMPREHENSIVE METABOLIC PANEL - Abnormal; Notable for the following:    Potassium 3.1 (*)    Glucose, Bld 128 (*)    BUN <5 (*)    Creatinine, Ser 0.38 (*)    AST 45 (*)    Total Bilirubin 2.0 (*)    All other components within normal limits  PROTIME-INR  I-STAT TROPOININ, ED  POC URINE PREG, ED    Imaging Review Dg Chest 2 View  04/29/2014   CLINICAL DATA:  Chest pain, sickle cell crisis  EXAM: CHEST  2 VIEW  COMPARISON:  04/24/2014  FINDINGS: Mild left basilar opacity, likely atelectasis. No pleural effusion or pneumothorax.  The heart is normal in size.  Right chest port terminating in the upper right atrium.  IMPRESSION: Mild left basilar opacity, likely atelectasis.   Electronically Signed   By: Charline Bills M.D.   On: 04/29/2014 13:50     EKG Interpretation   Date/Time:  Sunday April 29 2014 12:50:11 EST Ventricular Rate:  80 PR Interval:  153 QRS Duration: 87 QT Interval:  398 QTC Calculation: 459 R Axis:   75 Text Interpretation:  Sinus rhythm No significant change since last  tracing Confirmed by Sharlotte Baka  MD-J, Marcelina Mclaurin ([id number]) on  04/29/2014 4:59:57 PM     Medications  0.9 %  sodium chloride infusion (1,000 mLs Intravenous New Bag/Given 04/29/14 1527)  ondansetron (ZOFRAN) injection 4 mg (4 mg Intravenous Given 04/29/14 1531)  diphenhydrAMINE (BENADRYL) injection 12.5 mg (12.5 mg Intravenous Given 04/29/14 1527)  HYDROmorphone (DILAUDID) injection 2 mg (2 mg Intravenous Given 04/29/14 1648)  ketorolac (TORADOL) 30 MG/ML injection 30 mg (30 mg Intravenous Given 04/29/14 1529)  HYDROmorphone (DILAUDID) tablet 2 mg (2 mg Oral Given 04/29/14 1528)    MDM   Final diagnoses:  Sickle cell crisis  Reviewed ed care plan.  Will start with toradol and oral dilaudid. 1644  No response to oral dilaudid and toradol IV.  Will give dose of IV dilaudid per her care plan.   1820  Still having pain after her initial dilaudid.  Will give an additional dose 1838  Pt is having peristent pain despite toradol, IV and oral dilaudid.  Will consult with hospitalist regarding persistent sickle cell crisis.    Linwood Dibbles, MD 04/29/14 [phone]

## 2014-04-29 NOTE — H&P (Signed)
History and Physical  Richmond CampbellMiranda Caba UXL:[id number] DOB: 09/03/1985 DOA: 04/29/2014   PCP: [name],[name], MD   Chief Complaint: Chest pain and hip pain  HPI:  29 year old female with a history of sickle cell disease presents with one-day history of increasing chest pain and bilateral hip pain that began on the night prior to admission. The patient states that her chest discomfort is a different sensation than her usual pain that she gets with her crisis. She describes it more as a pressure. She also has some dyspnea on exertion, but denies any coughing, hemoptysis, fevers, chills. She had one episode of nausea and vomiting on 04/28/2014. She continues to have some nausea. She denies any headache, visual disturbance, focal extremity weakness, dysuria, hematuria. She denies any recent injuries or trauma. She states that she try to take her Percocet at home without relief. She took 3 Percocet and also 3 Aleve without any relief of her discomfort. As a result she came to emergency department for further evaluation.  In the emergency department, the patient was afebrile and hemodynamically stable. Heart rate was 92 with oxygen saturation 99% on room air. Potassium was 3.1. WBC 11.1, hemoglobin 8.3. Reticulocyte count was greater than 23%. EKG showed sinus rhythm without any concerning ST-T wave changes. Urine pregnancy test was neg. Total bilirubin was 2.0. Assessment/Plan: Sickle cell pain crisis -Patient states that her hip pain is typical for sickle cell crisis but her chest discomfort is a different sensation. -Continue IV fluids -IV Toradol every 6 hours -Start IV Dilaudid PCA -Continue Hydrea, folic acid, gabapentin -Continue antiemetics Atypical chest pain -Patient states "everything makes it worse" -EKG without any concerning ST-T wave changes -Cycle troponins -03/19/2014 echocardiogram EF 55-60%, no WMA -Placed on telemetry for the first 24 hours Epigastric pain -May have  some gastritis related to the patient's NSAID use -Start H2 blocker -Check lipase Hypokalemia -Replete -Check magnesium      Past Medical History  Diagnosis Date  . Sickle cell anemia    Past Surgical History  Procedure Laterality Date  . Tubal ligation    . Cholecystectomy    . Cesarean section    . Port a cath placement Right     about 6-7 years ago  . Cholecystectomy  2000   Social History:  reports that she has quit smoking. She does not have any smokeless tobacco history on file. She reports that she does not drink alcohol or use illicit drugs.   Family History  Problem Relation Age of Onset  . Sickle cell anemia Other   . Sickle cell trait Father   . Sickle cell trait Mother      Allergies  Allergen Reactions  . Ultram [Tramadol] Other (See Comments)    seizures  . Morphine And Related Hives, Rash and Other (See Comments)    shaking      Prior to Admission medications   Medication Sig Start Date End Date Taking? Authorizing Provider  folic acid (FOLVITE) 1 MG tablet Take 1 tablet (1 mg total) by mouth daily. 02/16/14  Yes Massie MaroonLachina M [name], FNP  gabapentin (NEURONTIN) 300 MG capsule Take 1 capsule (300 mg total) by mouth 3 (three) times daily. 02/14/14  Yes Altha HarmMichelle A [name], MD  hydroxyurea (HYDREA) 500 MG capsule Take 1 capsule (500 mg total) by mouth daily. May take with food to minimize GI side effects. 02/14/14  Yes Altha HarmMichelle A [name], MD  Naproxen Sodium (ALEVE) 220 MG CAPS Take 660 mg by  mouth every 6 (six) hours as needed (pain).   Yes Historical Provider, MD  oxyCODONE-acetaminophen (PERCOCET) 10-325 MG per tablet Take 1 tablet by mouth every 4 (four) hours as needed for pain. 04/24/14  Yes Altha Harm, MD  Topiramate ER 100 MG CP24 Take 100 mg by mouth at bedtime.   Yes Historical Provider, MD    Review of Systems:  Constitutional:  No weight loss, night sweats Head&Eyes: No headache.  No vision loss.  No eye pain or scotoma ENT:    No Difficulty swallowing,Tooth/dental problems,Sore throat,  No ear ache, post nasal drip,  Cardio-vascular:  No Orthopnea, PND, swelling in lower extremities,  dizziness, palpitations  GI:  No  abdominal pain,  diarrhea, loss of appetite, hematochezia, melena, heartburn, indigestion, Resp:   No cough. No coughing up of blood .No wheezing.No chest wall deformity  Skin:  no rash or lesions.  GU:  no dysuria, change in color of urine, no urgency or frequency. No flank pain.  Musculoskeletal:  No joint pain or swelling. No decreased range of motion. No back pain. Complains of bilateral hip pain Psych:  No change in mood or affect.  Neurologic: No headache, no dysesthesia, no focal weakness, no vision loss. No syncope  Physical Exam: Filed Vitals:   04/29/14 1245 04/29/14 1538  BP: 104/61 100/69  Pulse: 91 92  Temp: 98.3 F (36.8 C)   TempSrc: Oral   Resp: 16 16  SpO2: 99% 97%   General:  A&O x 3, NAD, nontoxic, pleasant/cooperative Head/Eye: No conjunctival hemorrhage, no icterus, [place]/AT, No nystagmus ENT:  No icterus,  No thrush, good dentition, no pharyngeal exudate Neck:  No masses, no lymphadenpathy, no bruits CV:  RRR, no rub, no gallop, no S3 Lung:  CTAB, good air movement, no wheeze, no rhonchi Abdomen: soft/epigastric pain without rebound, +BS, nondistended, no peritoneal signs Ext: No cyanosis, No rashes, No petechiae, No lymphangitis, No edema Neuro: CNII-XII intact, strength 4/5 in bilateral upper and lower extremities, no dysmetria  Labs on Admission:  Basic Metabolic Panel:  Recent Labs Lab 04/24/14 0924 04/29/14 1500  NA 141 141  K 3.5 3.1*  CL 114* 112  CO2 24 24  GLUCOSE 111* 128*  BUN 9 <5*  CREATININE 0.44* 0.38*  CALCIUM 8.5 8.7   Liver Function Tests:  Recent Labs Lab 04/29/14 1500  AST 45*  ALT 28  ALKPHOS 101  BILITOT 2.0*  PROT 6.9  ALBUMIN 3.8   No results for input(s): LIPASE, AMYLASE in the last 168 hours. No results for  input(s): AMMONIA in the last 168 hours. CBC:  Recent Labs Lab 04/24/14 0924 04/29/14 1459  WBC 11.0* 11.1*  NEUTROABS 7.1  --   HGB 8.2* 8.3*  HCT 24.1* 23.9*  MCV 103.4* 105.3*  PLT 334 327   Cardiac Enzymes:  Recent Labs Lab 04/24/14 0924  TROPONINI <0.03   BNP: Invalid input(s): POCBNP CBG: No results for input(s): GLUCAP in the last 168 hours.  Radiological Exams on Admission: Dg Chest 2 View  04/29/2014   CLINICAL DATA:  Chest pain, sickle cell crisis  EXAM: CHEST  2 VIEW  COMPARISON:  04/24/2014  FINDINGS: Mild left basilar opacity, likely atelectasis. No pleural effusion or pneumothorax.  The heart is normal in size.  Right chest port terminating in the upper right atrium.  IMPRESSION: Mild left basilar opacity, likely atelectasis.   Electronically Signed   By: Charline Bills M.D.   On: 04/29/2014 13:50    EKG: Independently  reviewed. Sinus rhythm, T-wave inversion in V1, no ST  changes    Time spent:60 minutes Code Status:   FULL Family Communication:  No Family at bedside   Harve Spradley, DO  [facility] Pager [phone]  If 7PM-7AM, please contact night-coverage [email or web address] Password [facility] 04/29/2014, 7:15 PM

## 2014-04-30 DIAGNOSIS — D5700 Hb-SS disease with crisis, unspecified: Principal | ICD-10-CM

## 2014-04-30 LAB — CBC WITH DIFFERENTIAL/PLATELET
Basophils Absolute: 0.1 10*3/uL (ref 0.0–0.1)
Basophils Relative: 0 % (ref 0–1)
Eosinophils Absolute: 0.2 10*3/uL (ref 0.0–0.7)
Eosinophils Relative: 1 % (ref 0–5)
HEMATOCRIT: 20.4 % — AB (ref 36.0–46.0)
Hemoglobin: 7.2 g/dL — ABNORMAL LOW (ref 12.0–15.0)
LYMPHS ABS: 4 10*3/uL (ref 0.7–4.0)
Lymphocytes Relative: 30 % (ref 12–46)
MCH: 37.1 pg — ABNORMAL HIGH (ref 26.0–34.0)
MCHC: 35.3 g/dL (ref 30.0–36.0)
MCV: 105.2 fL — ABNORMAL HIGH (ref 78.0–100.0)
Monocytes Absolute: 1.3 10*3/uL — ABNORMAL HIGH (ref 0.1–1.0)
Monocytes Relative: 10 % (ref 3–12)
NEUTROS ABS: 7.8 10*3/uL — AB (ref 1.7–7.7)
NEUTROS PCT: 59 % (ref 43–77)
PLATELETS: 275 10*3/uL (ref 150–400)
RBC: 1.94 MIL/uL — ABNORMAL LOW (ref 3.87–5.11)
RDW: 21 % — ABNORMAL HIGH (ref 11.5–15.5)
WBC: 13.3 10*3/uL — AB (ref 4.0–10.5)

## 2014-04-30 LAB — COMPREHENSIVE METABOLIC PANEL
ALK PHOS: 84 U/L (ref 39–117)
ALT: 25 U/L (ref 0–35)
AST: 39 U/L — ABNORMAL HIGH (ref 0–37)
Albumin: 3.2 g/dL — ABNORMAL LOW (ref 3.5–5.2)
Anion gap: 0 — ABNORMAL LOW (ref 5–15)
BUN: <5 mg/dL — ABNORMAL LOW (ref 6–23)
CHLORIDE: 115 mmol/L — AB (ref 96–112)
CO2: 23 mmol/L (ref 19–32)
Calcium: 8 mg/dL — ABNORMAL LOW (ref 8.4–10.5)
Creatinine, Ser: 0.32 mg/dL — ABNORMAL LOW (ref 0.50–1.10)
GFR calc Af Amer: >90 mL/min (ref >90)
Glucose, Bld: 88 mg/dL (ref 70–99)
POTASSIUM: 4 mmol/L (ref 3.5–5.1)
SODIUM: 138 mmol/L (ref 135–145)
Total Bilirubin: 1.7 mg/dL — ABNORMAL HIGH (ref 0.3–1.2)
Total Protein: 5.9 g/dL — ABNORMAL LOW (ref 6.0–8.3)

## 2014-04-30 LAB — LACTATE DEHYDROGENASE: LDH: 232 U/L (ref 94–250)

## 2014-04-30 LAB — TROPONIN I

## 2014-04-30 LAB — MAGNESIUM: Magnesium: 1.9 mg/dL (ref 1.5–2.5)

## 2014-04-30 MED ORDER — DEXTROSE-NACL 5-0.45 % IV SOLN
INTRAVENOUS | Status: DC
  Administered 2014-04-30 – 2014-05-01 (×2): via INTRAVENOUS

## 2014-04-30 MED ORDER — HYDROMORPHONE 0.3 MG/ML IV SOLN
INTRAVENOUS | Status: DC
  Administered 2014-04-30: 4.99 mg via INTRAVENOUS
  Administered 2014-04-30 (×2): via INTRAVENOUS
  Administered 2014-04-30: 5.49 mg via INTRAVENOUS
  Administered 2014-04-30: 20:00:00 via INTRAVENOUS
  Administered 2014-05-01: 6.99 mg via INTRAVENOUS
  Administered 2014-05-01: 3.18 mg via INTRAVENOUS
  Administered 2014-05-01: 3.99 mg via INTRAVENOUS
  Administered 2014-05-01: 08:00:00 via INTRAVENOUS
  Filled 2014-04-30 (×4): qty 25

## 2014-04-30 MED ORDER — KETOROLAC TROMETHAMINE 15 MG/ML IJ SOLN: 15.0000 mg | Freq: Once | INTRAMUSCULAR | Status: DC

## 2014-04-30 MED ORDER — SODIUM CHLORIDE 0.9 % IV BOLUS (SEPSIS)
500.0000 mL | Freq: Once | INTRAVENOUS | Status: AC
  Administered 2014-04-30: 500 mL via INTRAVENOUS

## 2014-04-30 MED ORDER — DIPHENHYDRAMINE HCL 25 MG PO CAPS
25.0000 mg | ORAL_CAPSULE | Freq: Four times a day (QID) | ORAL | Status: DC | PRN
  Filled 2014-04-30: qty 2

## 2014-04-30 NOTE — Progress Notes (Signed)
Patient ID: Brittany Foster, female   DOB: 07/10/1985, 29 y.o.   MRN: [id number] SICKLE CELL SERVICE PROGRESS NOTE  Brittany Foster DOB: 05/13/1985 DOA: 04/29/2014 PCP: [name],[name], MD   Presenting HPI: 29 year old female with a history of sickle cell disease presents with one-day history of increasing chest pain and bilateral hip pain that began on the night prior to admission. The patient states that her chest discomfort is a different sensation than her usual pain that she gets with her crisis. She describes it more as a pressure. She also has some dyspnea on exertion, but denies any coughing, hemoptysis, fevers, chills. She had one episode of nausea and vomiting on 04/28/2014. She continues to have some nausea. She denies any headache, visual disturbance, focal extremity weakness, dysuria, hematuria. She denies any recent injuries or trauma. She states that she try to take her Percocet at home without relief. She took 3 Percocet and also 3 Aleve without any relief of her discomfort. As a result she came to emergency department for further evaluation.  In the emergency department, the patient was afebrile and hemodynamically stable. Heart rate was 92 with oxygen saturation 99% on room air. Potassium was 3.1. WBC 11.1, hemoglobin 8.3. Reticulocyte count was greater than 23%. EKG showed sinus rhythm without any concerning ST-T wave changes. Urine pregnancy test was neg. Total bilirubin was 2.0.  Consultants:  None  Procedures:  none  Antibiotics:  none  HPI/Subjective: Pt states that she is having pain in her chest, back, and hips. She rates her pain as a 9/10. With her chest pain, she feels that she is unable to catch a full breath. Denies cough, SOB, and fevers. She feels that her PCA isn't working. She denies any dizziness, weakness, or trauma. She does report a HA similar to her migraines.  Objective: Filed Vitals:   04/30/14 0743 04/30/14 0849 04/30/14 1215 04/30/14  1418  BP: 93/56     Pulse:      Temp:      TempSrc:      Resp:  17 17 17   Height:      Weight:      SpO2:  100% 100% 100%   Weight change:   Intake/Output Summary (Last 24 hours) at 04/30/14 1426 Last data filed at 04/30/14 1215  Gross per 24 hour  Intake   2765 ml  Output    251 ml  Net   2514 ml    General: Alert, awake, oriented x3, in no acute distress.  HEENT: [place]/AT PEERL, EOMI Neck: Trachea midline,  no masses, no thyromegal,y no JVD. OROPHARYNX:  Moist, No exudate/ erythema/lesions.  Heart: Regular rate and rhythm, without murmurs, rubs, gallops Lungs: Clear to auscultation, no wheezing or rhonchi noted. Abdomen: Soft, nontender, nondistended, positive bowel sounds, no masses no hepatosplenomegaly noted. Neuro: No focal neurological deficits noted cranial nerves II through XII grossly intact. Strength 5 out of 5 in bilateral upper and lower extremities. Musculoskeletal: No warmth, swelling or erythema around joints, no spinal tenderness noted. Mild tenderness over both hips. Extremities: no cyanosis or edema   Data Reviewed: Basic Metabolic Panel:  Recent Labs Lab 04/24/14 0924 04/29/14 1500 04/30/14 0400  NA 141 141 138  K 3.5 3.1* 4.0  CL 114* 112 115*  CO2 24 24 23   GLUCOSE 111* 128* 88  BUN 9 <5* <5*  CREATININE 0.44* 0.38* 0.32*  CALCIUM 8.5 8.7 8.0*  MG  --   --  1.9   Liver Function Tests:  Recent Labs Lab 04/29/14 1500 04/30/14 0400  AST 45* 39*  ALT 28 25  ALKPHOS 101 84  BILITOT 2.0* 1.7*  PROT 6.9 5.9*  ALBUMIN 3.8 3.2*   CBC:  Recent Labs Lab 04/24/14 0924 04/29/14 1459 04/30/14 0400  WBC 11.0* 11.1* 13.3*  NEUTROABS 7.1  --  7.8*  HGB 8.2* 8.3* 7.2*  HCT 24.1* 23.9* 20.4*  MCV 103.4* 105.3* 105.2*  PLT 334 327 275   Cardiac Enzymes:  Recent Labs Lab 04/24/14 0924 04/29/14 2210 04/30/14 0400 04/30/14 1135  TROPONINI <0.03 <0.03 <0.03 <0.03   BNP (last 3 results) No results for input(s): PROBNP in the last 8760  hours. CBG: No results for input(s): GLUCAP in the last 168 hours.  No results found for this or any previous visit (from the past 240 hour(s)).   Studies: Dg Chest 2 View  04/29/2014   CLINICAL DATA:  Chest pain, sickle cell crisis  EXAM: CHEST  2 VIEW  COMPARISON:  04/24/2014  FINDINGS: Mild left basilar opacity, likely atelectasis. No pleural effusion or pneumothorax.  The heart is normal in size.  Right chest port terminating in the upper right atrium.  IMPRESSION: Mild left basilar opacity, likely atelectasis.   Electronically Signed   By: Charline Bills M.D.   On: 04/29/2014 13:50   Dg Chest 2 View  04/06/2014   CLINICAL DATA:  Chest pain extending to left shoulder. Shortness of breath. Sickle cell disease.  EXAM: CHEST - 2 VIEW  COMPARISON:  CTA chest 03/18/2014.  One-view chest 03/18/2014.  FINDINGS: The heart is mildly enlarged. Mild interstitial coarsening is stable. A right IJ Port-A-Cath is stable in position. The tip of the catheter is in the right atrium, stable. The catheter fragment in the innominate vein is stable. No focal airspace disease is present. Vertebral endplate changes are compatible with sickle cell disease.  IMPRESSION: 1. Stable cardiomegaly and chronic interstitial coarsening. 2. No superimposed acute cardiopulmonary disease. 3. The tip of the right IJ Port-A-Cath is in the right atrium.   Electronically Signed   By: Gennette Pac M.D.   On: 04/06/2014 14:55   Dg Chest Port 1 View  04/24/2014   CLINICAL DATA:  Sickle cell disease with chest pain and nausea  EXAM: PORTABLE CHEST - 1 VIEW  COMPARISON:  April 06, 2014  FINDINGS: Port-A-Cath present with tip in right atrium. No pneumothorax. Heart is borderline enlarged with pulmonary vascularity within normal limits. No lung edema or consolidation. No adenopathy. No bone lesions appreciable.  IMPRESSION: Heart borderline enlarged without edema or consolidation. No adenopathy.   Electronically Signed   By: Bretta Bang III M.D.   On: 04/24/2014 10:57    Scheduled Meds: . enoxaparin (LOVENOX) injection  30 mg Subcutaneous Q24H  . famotidine (PEPCID) IV  20 mg Intravenous Q12H  . folic acid  1 mg Oral Daily  . gabapentin  300 mg Oral TID  . HYDROmorphone PCA 0.3 mg/mL   Intravenous 6 times per day  . hydroxyurea  500 mg Oral Daily  . ketorolac  15 mg Intravenous 4 times per day  . ketorolac  15 mg Intravenous Once  . topiramate  50 mg Oral BID   Continuous Infusions: . dextrose 5 % and 0.45% NaCl 75 mL/hr at 04/30/14 1212    Principal Problem:   Sickle cell pain crisis Active Problems:   Chest pain   Hypokalemia   Sickle cell crisis   Assessment/Plan: Principal Problem:   Sickle cell pain  crisis Active Problems:   Chest pain   Hypokalemia   Sickle cell crisis 1)Sickle Cell Crisis: Patient presented with pain characteristic of acute vaso-occlusive crisis. Continue Dilaudid PCAat current settings. Continue Ketorolac. Continue gabapentin for chronic pain.  2)Atypical chest pain: May be due to crisis. She has been ruled out for acute coronary syndrome. Continue monitoring on telemetry. She also has a history of small pericardial effusion, so would obtain a TTE if cardiac problems arise.  3)Anemia: Hgb went from 8.3-7.2. LDH of 232. No known blood loss and has appropriate reticulocytosis. Will monitor for now.  4)Sickle Cell care: Continue Hydrea and Folic acid.  5)Migraines: Contiue topiramate for prophylaxis. Pt encouraged to use Tylenol for HA.   FEN/GI :Hypokalemia-resolved with supplementation.  Regular Diet   IV fluids-switch to D5/0.45% Bowel regimen in place  Code Status: full DVT Prophylaxis: enoxaparin Family Communication: none Disposition Plan: pending improvement  Brittany Foster  Pager [phone]. If 7PM-7AM, please contact night-coverage.  04/30/2014, 2:26 PM  LOS: 1 day   Brittany Foster

## 2014-04-30 NOTE — Progress Notes (Signed)
BP 91/51. Patient asymptomatic. PA on call paged. New order placed. Order carried out. Will recheck BP once bolus is complete

## 2014-04-30 NOTE — Progress Notes (Signed)
Pt has Medicaid 0 to $3.00 co-pay.  Unable to assist with co- pay.

## 2014-05-01 DIAGNOSIS — E876 Hypokalemia: Secondary | ICD-10-CM

## 2014-05-01 LAB — CBC WITH DIFFERENTIAL/PLATELET
Basophils Absolute: 0.1 10*3/uL (ref 0.0–0.1)
Basophils Relative: 1 % (ref 0–1)
EOS ABS: 0.3 10*3/uL (ref 0.0–0.7)
Eosinophils Relative: 3 % (ref 0–5)
HCT: 21 % — ABNORMAL LOW (ref 36.0–46.0)
Hemoglobin: 7.2 g/dL — ABNORMAL LOW (ref 12.0–15.0)
Lymphocytes Relative: 33 % (ref 12–46)
Lymphs Abs: 4.1 10*3/uL — ABNORMAL HIGH (ref 0.7–4.0)
MCH: 36.5 pg — AB (ref 26.0–34.0)
MCHC: 34.3 g/dL (ref 30.0–36.0)
MCV: 106.6 fL — ABNORMAL HIGH (ref 78.0–100.0)
MONOS PCT: 10 % (ref 3–12)
Monocytes Absolute: 1.2 10*3/uL — ABNORMAL HIGH (ref 0.1–1.0)
NEUTROS ABS: 6.8 10*3/uL (ref 1.7–7.7)
NEUTROS PCT: 53 % (ref 43–77)
PLATELETS: 265 10*3/uL (ref 150–400)
RBC: 1.97 MIL/uL — ABNORMAL LOW (ref 3.87–5.11)
RDW: 19.5 % — ABNORMAL HIGH (ref 11.5–15.5)
WBC: 12.6 10*3/uL — AB (ref 4.0–10.5)

## 2014-05-01 LAB — BASIC METABOLIC PANEL
Anion gap: 4 — ABNORMAL LOW (ref 5–15)
BUN: 6 mg/dL (ref 6–23)
CO2: 23 mmol/L (ref 19–32)
Calcium: 8.1 mg/dL — ABNORMAL LOW (ref 8.4–10.5)
Chloride: 113 mmol/L — ABNORMAL HIGH (ref 96–112)
Creatinine, Ser: 0.38 mg/dL — ABNORMAL LOW (ref 0.50–1.10)
GLUCOSE: 110 mg/dL — AB (ref 70–99)
POTASSIUM: 3.5 mmol/L (ref 3.5–5.1)
Sodium: 140 mmol/L (ref 135–145)

## 2014-05-01 LAB — RETICULOCYTES
RBC.: 1.97 MIL/uL — ABNORMAL LOW (ref 3.87–5.11)
RETIC COUNT ABSOLUTE: 413.7 10*3/uL — AB (ref 19.0–186.0)
RETIC CT PCT: 21 % — AB (ref 0.4–3.1)

## 2014-05-01 LAB — LACTATE DEHYDROGENASE: LDH: 221 U/L (ref 94–250)

## 2014-05-01 MED ORDER — HEPARIN SOD (PORK) LOCK FLUSH 100 UNIT/ML IV SOLN
500.0000 [IU] | INTRAVENOUS | Status: AC | PRN
Start: 2014-05-01 — End: 2014-05-01
  Administered 2014-05-01: 500 [IU]

## 2014-05-01 NOTE — Progress Notes (Signed)
Went over all discharge instructions with pt.  All questions answered.  Pt upset about discharge, spoke with Chiropodistassistant director.  Pt port deaccessed by IV nurse.  Refused 10 o'clock mediations, stated she would take when she got home.  Pt refused wheelchair, walked out with NT.  Waiting on cab.  VSS.

## 2014-05-01 NOTE — Discharge Summary (Signed)
Brittany Foster MRN: [id number] DOB/AGE: 30/03/1985 28 y.o.  Admit date: 04/29/2014 Discharge date: 05/01/2014  Primary Care Physician:  Jendaya Gossett A., MD   Discharge Diagnoses:   Patient Active Problem List   Diagnosis Date Noted  . Sickle cell crisis 04/29/2014  . Hb-SS disease with crisis 04/24/2014  . Sickle cell disease with crisis 04/19/2014  . Pericardial effusion   . Anemia 02/09/2014  . Hypokalemia 02/09/2014  . Neuropathy 02/09/2014  . Chronic headache disorder 02/09/2014  . Leukocytosis 01/29/2014  . Pain in the chest   . Abnormal CT scan, chest   . Sickle cell anemia with pain 12/30/2013  . Chest pain 12/30/2013  . Sickle cell pain crisis 12/30/2013    DISCHARGE MEDICATION:   Medication List    TAKE these medications        ALEVE 220 MG Caps  Generic drug:  Naproxen Sodium  Take 660 mg by mouth every 6 (six) hours as needed (pain).     folic acid 1 MG tablet  Commonly known as:  FOLVITE  Take 1 tablet (1 mg total) by mouth daily.     gabapentin 300 MG capsule  Commonly known as:  NEURONTIN  Take 1 capsule (300 mg total) by mouth 3 (three) times daily.     hydroxyurea 500 MG capsule  Commonly known as:  HYDREA  Take 1 capsule (500 mg total) by mouth daily. May take with food to minimize GI side effects.     oxyCODONE-acetaminophen 10-325 MG per tablet  Commonly known as:  PERCOCET  Take 1 tablet by mouth every 4 (four) hours as needed for pain.     Topiramate ER 100 MG Cp24  Take 100 mg by mouth at bedtime.          Consults:     SIGNIFICANT DIAGNOSTIC STUDIES:  Dg Chest 2 View  04/29/2014   CLINICAL DATA:  Chest pain, sickle cell crisis  EXAM: CHEST  2 VIEW  COMPARISON:  04/24/2014  FINDINGS: Mild left basilar opacity, likely atelectasis. No pleural effusion or pneumothorax.  The heart is normal in size.  Right chest port terminating in the upper right atrium.  IMPRESSION: Mild left basilar opacity, likely atelectasis.   Electronically  Signed   By: Charline BillsSriyesh  Krishnan M.D.   On: 04/29/2014 13:50   Dg Chest 2 View  04/06/2014   CLINICAL DATA:  Chest pain extending to left shoulder. Shortness of breath. Sickle cell disease.  EXAM: CHEST - 2 VIEW  COMPARISON:  CTA chest 03/18/2014.  One-view chest 03/18/2014.  FINDINGS: The heart is mildly enlarged. Mild interstitial coarsening is stable. A right IJ Port-A-Cath is stable in position. The tip of the catheter is in the right atrium, stable. The catheter fragment in the innominate vein is stable. No focal airspace disease is present. Vertebral endplate changes are compatible with sickle cell disease.  IMPRESSION: 1. Stable cardiomegaly and chronic interstitial coarsening. 2. No superimposed acute cardiopulmonary disease. 3. The tip of the right IJ Port-A-Cath is in the right atrium.   Electronically Signed   By: Gennette Pachris  Mattern M.D.   On: 04/06/2014 14:55   Dg Chest Port 1 View  04/24/2014   CLINICAL DATA:  Sickle cell disease with chest pain and nausea  EXAM: PORTABLE CHEST - 1 VIEW  COMPARISON:  April 06, 2014  FINDINGS: Port-A-Cath present with tip in right atrium. No pneumothorax. Heart is borderline enlarged with pulmonary vascularity within normal limits. No lung edema or consolidation. No adenopathy. No bone  lesions appreciable.  IMPRESSION: Heart borderline enlarged without edema or consolidation. No adenopathy.   Electronically Signed   By: Bretta Bang III M.D.   On: 04/24/2014 10:57       No results found for this or any previous visit (from the past 240 hour(s)).  BRIEF ADMITTING H & P: 29 year old female with a history of sickle cell disease presents with one-day history of increasing chest pain and bilateral hip pain that began on the night prior to admission. The patient states that her chest discomfort is a different sensation than her usual pain that she gets with her crisis. She describes it more as a pressure. She also has some dyspnea on exertion, but denies any  coughing, hemoptysis, fevers, chills. She had one episode of nausea and vomiting on 04/28/2014. She continues to have some nausea. She denies any headache, visual disturbance, focal extremity weakness, dysuria, hematuria. She denies any recent injuries or trauma. She states that she try to take her Percocet at home without relief. She took 3 Percocet and also 3 Aleve without any relief of her discomfort. As a result she came to emergency department for further evaluation.  In the emergency department, the patient was afebrile and hemodynamically stable. Heart rate was 92 with oxygen saturation 99% on room air. Potassium was 3.1. WBC 11.1, hemoglobin 8.3. Reticulocyte count was greater than 23%. EKG showed sinus rhythm without any concerning ST-T wave changes. Urine pregnancy test was neg. Total bilirubin was 2.0.   Hospital Course:  Present on Admission:  . Sickle cell crisis: Pt was admitted for Sickle Cell Pain Crisis. She was managed with Dilaudid via PCA, Toradol and IVF. She was transitioned to oral analgesics and subsequently discharged home in good condition. At the time of discharge patient was functional in her ambulation and ADL's and did not have a requirement for Oxygen.  . Chronic Pain: Pt has a component of chronic pain as she requires daily analgesic use. I have had discussions with patient about the need to effectively manage chronic pain so that she does not keep presenting to ED's on a weekly basis for IV analgesics. However she is resistant to pursuing care and continues to pursue IV analgesics as the treatment for chronic pain. I have specifically discussed with the patient that a patient who is having this many crises in a week would have a physiological effect that would cause her to appear chronically ill. This patient appears healthy which is not consistent with her proposed clinical course. I will discuss further at her appointment on 05/17/2014.  Marland Kitchen Chest wall pain:  Non-Cardiac.  Resolved at time of discharge.  . Hypokalemia: Orally replaced.  . Low BP: Pt ha a normally low BP as reported by patient and supported in records from both [facility] and [facility]. She has no clinical sequela resultant from her BP. No intervention.  Disposition and Follow-up: Pt discharged home in stable condition. She is to follow up in the office for appointment on 05/17/2014.      Discharge Instructions    Activity as tolerated - No restrictions    Complete by:  As directed      Diet general    Complete by:  As directed            DISCHARGE EXAM:  General: Alert, awake, oriented x3, in mild distress.  Vital Signs: Blood pressure 94/58, HR 77, T 98 F (36.7 C), temperature source Oral, RR 16, height 5' (1.524  m), weight 119 lb 0.8 oz (54 kg), last menstrual period 04/02/2014, SpO2 100 %. HEENT: [place]/AT PEERL, EOMI, anicteric Neck: Trachea midline, no masses, no thyromegal,y no JVD, no carotid bruit OROPHARYNX: Moist, No exudate/ erythema/lesions.  Heart: Regular rate and rhythm, without murmurs, rubs, gallops, PMI non-displaced.  Lungs: Clear to auscultation, no wheezing or rhonchi noted.  Abdomen: Soft, nontender, nondistended, positive bowel sounds, no masses no hepatosplenomegaly noted.  Neuro: No focal neurological deficits noted cranial nerves II through XII grossly intact. DTRs 2+ bilaterally upper and lower extremities. Strength at baseline in bilateral upper and lower extremities. Musculoskeletal: No warm swelling or erythema around joints, no spinal tenderness noted. Psychiatric: Patient alert and oriented x3, good insight and cognition, good recent to remote recall. Lymph node survey: No cervical axillary or inguinal lymphadenopathy noted.     Recent Labs  04/30/14 0400 05/01/14 0525  NA 138 140  K 4.0 3.5  CL 115* 113*  CO2 23 23  GLUCOSE 88 110*  BUN <5* 6  CREATININE 0.32* 0.38*  CALCIUM 8.0* 8.1*  MG 1.9  --     Recent  Labs  04/29/14 1500 04/30/14 0400  AST 45* 39*  ALT 28 25  ALKPHOS 101 84  BILITOT 2.0* 1.7*  PROT 6.9 5.9*  ALBUMIN 3.8 3.2*    Recent Labs  04/29/14 2210  LIPASE 23    Recent Labs  04/30/14 0400 05/01/14 0525  WBC 13.3* 12.6*  NEUTROABS 7.8* 6.8  HGB 7.2* 7.2*  HCT 20.4* 21.0*  MCV 105.2* 106.6*  PLT 275 265   Total time spent including face to face and decision making was greater than 30 minutes.  Signed: Larose Batres A. 05/01/2014, 9:16 AM

## 2014-05-01 NOTE — Progress Notes (Signed)
Pt o2 on room air is 94%.  Pt refusing to walk in hall to measure o2.  Educated on importance of ambulating and checking o2 sats.  MD made aware.

## 2014-05-07 ENCOUNTER — Telehealth: Payer: Self-pay | Admitting: Internal Medicine

## 2014-05-07 DIAGNOSIS — D5700 Hb-SS disease with crisis, unspecified: Secondary | ICD-10-CM

## 2014-05-07 MED ORDER — OXYCODONE-ACETAMINOPHEN 10-325 MG PO TABS
1.0000 | ORAL_TABLET | ORAL | Status: DC | PRN
Start: 2014-05-07 — End: 2014-05-30

## 2014-05-07 NOTE — Telephone Encounter (Signed)
Meds ordered this encounter  Medications  . oxyCODONE-acetaminophen (PERCOCET) 10-325 MG per tablet    Sig: Take 1 tablet by mouth every 4 (four) hours as needed for pain.    Dispense:  90 tablet    Refill:  0    Rx not to be filled prior to 05/09/2014    Order Specific Question:  Supervising Provider    Answer:  Marthann SchillerMATTHEWS, MICHELLE A [[id number]]  Reviewed [place] Substance Reporting system prior to reorder Massie MaroonHollis,Heydy Montilla M, FNP

## 2014-05-07 NOTE — Telephone Encounter (Signed)
PLEASE SEE REFILL REQUEST BELOW. THANKS!

## 2014-05-07 NOTE — Telephone Encounter (Signed)
REFILL REQUEST FOR PERCOCET 10/325MG . LOV 02/16/2014

## 2014-05-11 ENCOUNTER — Telehealth: Payer: Self-pay | Admitting: Internal Medicine

## 2014-05-17 ENCOUNTER — Ambulatory Visit: Payer: Medicaid Other | Admitting: Internal Medicine

## 2014-05-30 ENCOUNTER — Telehealth: Payer: Self-pay | Admitting: Internal Medicine

## 2014-05-30 DIAGNOSIS — G894 Chronic pain syndrome: Secondary | ICD-10-CM

## 2014-05-30 DIAGNOSIS — D5700 Hb-SS disease with crisis, unspecified: Secondary | ICD-10-CM

## 2014-05-30 MED ORDER — OXYCODONE-ACETAMINOPHEN 10-325 MG PO TABS: 1.0000 | ORAL_TABLET | ORAL | Status: DC | PRN

## 2014-05-30 NOTE — Telephone Encounter (Signed)
Brittany Foster will need to schedule a follow up appointment with Dr. Ashley RoyaltyMatthews prior to receiving another prescription for opiate medication (s). Reviewed [place] Substance Reporting system prior to reorder of opiate medication.   Meds ordered this encounter  Medications  . oxyCODONE-acetaminophen (PERCOCET) 10-325 MG per tablet    Sig: Take 1 tablet by mouth every 4 (four) hours as needed for pain.    Dispense:  90 tablet    Refill:  0    Order Specific Question:  Supervising Provider    Answer:  Marthann SchillerMATTHEWS, MICHELLE A [[id number]]   Massie MaroonHollis,Lachina M, FNP

## 2014-05-30 NOTE — Telephone Encounter (Signed)
Refill request for percocet 10/325mg. LOV 02/16/2014. Please advise.Thanks!  

## 2014-06-04 ENCOUNTER — Telehealth: Payer: Self-pay | Admitting: Internal Medicine

## 2014-06-04 NOTE — Telephone Encounter (Signed)
Refill request for percocet 10/325mg. LOV 02/16/2014. Please advise.Thanks!  

## 2014-06-07 ENCOUNTER — Ambulatory Visit: Payer: Medicaid Other | Admitting: Internal Medicine

## 2014-06-07 ENCOUNTER — Telehealth: Payer: Self-pay

## 2014-06-07 NOTE — Telephone Encounter (Signed)
-----   Message from Massie MaroonLachina M [name], OregonFNP sent at 06/07/2014  8:16 AM EDT ----- [name] needs to schedule a follow up appointment prior to receiving refills. She has to be seen every 3 months according to her contract, we have not seen her in the clinic since December.

## 2014-06-07 NOTE — Telephone Encounter (Signed)
Called and advised patient she need appointment for refills. Appointment was scheduled for 06/11/2014 @10 :45am. Thanks!

## 2014-06-11 ENCOUNTER — Ambulatory Visit (INDEPENDENT_AMBULATORY_CARE_PROVIDER_SITE_OTHER): Payer: Medicaid Other | Admitting: Internal Medicine

## 2014-06-11 VITALS — BP 120/76 | HR 88 | Temp 98.4°F | Resp 16 | Ht 60.0 in | Wt 122.0 lb

## 2014-06-11 DIAGNOSIS — G894 Chronic pain syndrome: Secondary | ICD-10-CM | POA: Diagnosis not present

## 2014-06-11 DIAGNOSIS — D571 Sickle-cell disease without crisis: Secondary | ICD-10-CM | POA: Diagnosis not present

## 2014-06-11 DIAGNOSIS — D5700 Hb-SS disease with crisis, unspecified: Secondary | ICD-10-CM | POA: Diagnosis not present

## 2014-06-11 DIAGNOSIS — E559 Vitamin D deficiency, unspecified: Secondary | ICD-10-CM

## 2014-06-11 DIAGNOSIS — Z79891 Long term (current) use of opiate analgesic: Secondary | ICD-10-CM

## 2014-06-11 DIAGNOSIS — Z23 Encounter for immunization: Secondary | ICD-10-CM | POA: Diagnosis not present

## 2014-06-11 DIAGNOSIS — Z79899 Other long term (current) drug therapy: Secondary | ICD-10-CM

## 2014-06-11 MED ORDER — OXYCODONE-ACETAMINOPHEN 10-325 MG PO TABS: 1.0000 | ORAL_TABLET | Freq: Four times a day (QID) | ORAL | Status: DC | PRN

## 2014-06-11 MED ORDER — HYDROXYUREA 500 MG PO CAPS: 1000.0000 mg | ORAL_CAPSULE | Freq: Every day | ORAL | Status: DC

## 2014-06-11 NOTE — Progress Notes (Signed)
Patient ID: Brittany Foster, female   DOB: 10/09/1985, 29 y.o.   MRN: [id number]   Brittany Foster, is a 29 y.o. female  [id number]  [id number]  DOB - 01/01/1986  CC:  Chief Complaint  Patient presents with  . Follow-up       HPI: Brittany Foster is a 29 y.o. female here today to establish medical care. Patient has No headache, No chest pain, No abdominal pain - No Nausea, No new weakness tingling or numbness, No Cough - SOB.  Allergies  Allergen Reactions  . Ultram [Tramadol] Other (See Comments)    seizures  . Morphine And Related Hives, Rash and Other (See Comments)    shaking   Past Medical History  Diagnosis Date  . Sickle cell anemia    Current Outpatient Prescriptions on File Prior to Visit  Medication Sig Dispense Refill  . folic acid (FOLVITE) 1 MG tablet Take 1 tablet (1 mg total) by mouth daily. 30 tablet 11  . gabapentin (NEURONTIN) 300 MG capsule Take 1 capsule (300 mg total) by mouth 3 (three) times daily. 90 capsule 2  . Naproxen Sodium (ALEVE) 220 MG CAPS Take 660 mg by mouth every 6 (six) hours as needed (pain).    . Topiramate ER 100 MG CP24 Take 100 mg by mouth at bedtime.     No current facility-administered medications on file prior to visit.   Family History  Problem Relation Age of Onset  . Sickle cell anemia Other   . Sickle cell trait Father   . Sickle cell trait Mother    History   Social History  . Marital Status: Single    Spouse Name: N/A  . Number of Children: N/A  . Years of Education: N/A   Occupational History  . Not on file.   Social History Main Topics  . Smoking status: Former Games developermoker  . Smokeless tobacco: Not on file     Comment: smokes once every couple of weeks.   . Alcohol Use: No  . Drug Use: No  . Sexual Activity: Not on file   Other Topics Concern  . Not on file   Social History Narrative    Review of Systems: Constitutional: Negative for fever, chills, diaphoresis, activity change, appetite change and  fatigue. HENT: Negative for ear pain, nosebleeds, congestion, facial swelling, rhinorrhea, neck pain, neck stiffness and ear discharge.  Eyes: Negative for pain, discharge, redness, itching and visual disturbance. Respiratory: Negative for cough, choking, chest tightness, shortness of breath, wheezing and stridor.  Cardiovascular: Negative for chest pain, palpitations and leg swelling. Gastrointestinal: Negative for abdominal distention. Genitourinary: Negative for dysuria, urgency, frequency, hematuria, flank pain, decreased urine volume, difficulty urinating and dyspareunia.  Musculoskeletal: Negative for back pain, joint swelling, arthralgia and gait problem. Neurological: Negative for dizziness, tremors, seizures, syncope, facial asymmetry, speech difficulty, weakness, light-headedness, numbness and headaches.  Hematological: Negative for adenopathy. Does not bruise/bleed easily. Psychiatric/Behavioral: Negative for hallucinations, behavioral problems, confusion, dysphoric mood, decreased concentration and agitation.     Objective:         Filed Vitals:   06/11/14 1035  BP: 120/76  Pulse: 88  Temp: 98.4 F (36.9 C)  Resp: 16    Physical Exam: Constitutional: Patient appears well-developed and well-nourished. No distress. HENT: Normocephalic, atraumatic, External right and left ear normal. Oropharynx is clear and moist.  Eyes: Conjunctivae and EOM are normal. PERRLA, no scleral icterus. Neck: Normal ROM. Neck supple. No JVD. No tracheal deviation. No thyromegaly. CVS: RRR, S1/S2 +,  no murmurs, no gallops, no carotid bruit.  Pulmonary: Effort and breath sounds normal, no stridor, rhonchi, wheezes, rales.  Abdominal: Soft. BS +, no distension, tenderness, rebound or guarding.  Musculoskeletal: Normal range of motion. No edema and no tenderness.  Lymphadenopathy: No lymphadenopathy noted, cervical, inguinal or axillary Neuro: Alert. Normal reflexes, muscle tone coordination.  No cranial nerve deficit. Skin: Skin is warm and dry. No rash noted. Not diaphoretic. No erythema. No pallor. Psychiatric: Normal mood and affect. Behavior, judgment, thought content normal. Genitalia: External genitalia normal. Vaginal vault shows healthy tissue on visual inspection. No foul smelling discharge. Pt exhibits good lubrication with physiological discharge. No adnexal tenderness noted however there is a soft tissue mass felt on the anterior left vaginal vault.   Lab Results  Component Value Date   WBC 12.6* 05/01/2014   HGB 7.2* 05/01/2014   HCT 21.0* 05/01/2014   MCV 106.6* 05/01/2014   PLT 265 05/01/2014   Lab Results  Component Value Date   CREATININE 0.38* 05/01/2014   BUN 6 05/01/2014   NA 140 05/01/2014   K 3.5 05/01/2014   CL 113* 05/01/2014   CO2 23 05/01/2014    No results found for: HGBA1C Lipid Panel  No results found for: CHOL, TRIG, HDL, CHOLHDL, VLDL, LDLCALC     Assessment and plan:   1. Hb-SS disease without crisis 1. Sickle cell disease - Increase Hydrea to 1000 mg daily. We discussed the need for good hydration, monitoring of hydration status, avoidance of heat, cold, stress, and infection triggers. We discussed the risks and benefits of Hydrea, including bone marrow suppression, the possibility of GI upset, skin ulcers, hair thinning, and teratogenicity. The patient was reminded of the need to seek medical attention of any symptoms of bleeding, anemia, or infection. Continue folic acid 1 mg daily to prevent aplastic bone marrow crises.  - Will check CBC with diff and reticulocyte  2. Pulmonary evaluation - Patient denies severe recurrent wheezes, shortness of breath with exercise, or persistent cough. If these symptoms develop, pulmonary function tests with spirometry will be ordered, and if abnormal, plan on referral to Pulmonology for further evaluation.  3. Cardiac - Routine screening for pulmonary hypertension is not recommended.  4. Eye -  High risk of proliferative retinopathy. Annual eye exam with retinal exam recommended to patient.  5. Immunization status - She will receive Prevnar today. Yearly influenza vaccination is recommended, as well as being up to date with Meningococcal and Pneumococcal vaccines.   6. Acute and chronic painful episodes - We agreed on Percocet 10-325 mg 100 pills  per month plan on titrating her Medication dose. We discussed that she is to receive her Schedule II prescriptions only from [place]. She is also aware that her prescription history is available to [place] online through the [facility] CSRS. Controlled substance agreement signed (Date). We reminded (Pt) that all patients receiving Schedule II narcotics must be seen for follow up every three months. We reviewed the terms of our pain agreement, including the need to keep medicines in a safe locked location away from children or pets, and the need to report excess sedation or constipation, measures to avoid constipation, and policies related to early refills and stolen prescriptions. According to the [place] [facility] program, we have reviewed details related to analgesia, adverse effects, aberrant behaviors.   - I have reviewed CBC with diff from [facility] from 06/09/2014. WBC:7.8, Hb: 11.1, Hct: 23.4, Plt: 394, Reticulocyte 6.1%. ANC: 8300. Will increase Hydrea  to 1000 mg daily.  - we discussed that taking opiates every 4 hours was an indication of management of chronic pain and not episodic pain. Pt reports that she required pain medication fpr 16/31 days last month and she is to keep a diary of her pain medication use. If the pattern is one of daily analgesic use then she will be referred to pain a pain specialist to whom she is very reluctant to go based on prior experience. We also discussed pursuing counseling which is a requirement for her to continue to receive any management of pain here at this practice. She will follow up with Ms. Williams at the [facility] for the referral. I also discussed wit Ms. Reposa the importance of forming positive social relationships to encourage her in a positive direction.  7. Iron overload from chronic transfusion: Will check Ferritin levels on Annual visit examination  8. Vitamin D deficiency - Pt prescribed Vitamin D 2000 IU OTC. However she hs not been taking it on a daily basis. She is encouraged her to take it.  - hydroxyurea (HYDREA) 500 MG capsule; Take 2 capsules (1,000 mg total) by mouth daily. May take with food to minimize GI side effects.  Dispense: 60 capsule; Refill: 3  - oxyCODONE-acetaminophen (PERCOCET) 10-325 MG per tablet; Take 1 tablet by mouth every 6 (six) hours as needed for pain.  Dispense: 100 tablet; Refill: 0  The above recommendations are taken from the NIH Evidence-Based Management of Sickle Cell Disease: Expert Panel Report, 04540    2. Hypokalemia - A review of her records shows that her potassium was marginally low at 3.3. Pt received oral replacement of potassium and Magnesium today. Will check labs on next visit.  3. Chronic Opiates for Therapeutic use - Prescript Monitor Profile(18)    Follow-up in 4 weeks for CPE and follow up CBC with diff, reticulocyte for surveillance of Hydrea.  The patient was given clear instructions to go to ER or return to medical center if symptoms don't improve, worsen or new problems develop. The patient verbalized understanding. The patient was told to call to get lab results if they haven't heard anything in the next week.     This note has been created with Education officer, environmental. Any transcriptional errors are unintentional.    Raahim Shartzer A., MD [facility] [place], [place] [phone]   06/11/2014, 11:37 AM

## 2014-06-15 LAB — PRESCRIPTION MONITORING PROFILE (18 PANEL)
Amphetamine/Meth: NEGATIVE ng/mL
BARBITURATE SCREEN, URINE: NEGATIVE ng/mL
Benzodiazepine Screen, Urine: NEGATIVE ng/mL
Buprenorphine, Urine: NEGATIVE ng/mL
CANNABINOID SCRN UR: NEGATIVE ng/mL
Carisoprodol, Urine: NEGATIVE ng/mL
Cocaine Metabolites: NEGATIVE ng/mL
Creatinine, Urine: 58.6 mg/dL (ref >20.0)
Fentanyl, Ur: NEGATIVE ng/mL
MDMA URINE: NEGATIVE ng/mL
MEPERIDINE UR: NEGATIVE ng/mL
Methadone Screen, Urine: NEGATIVE ng/mL
NITRITES URINE, INITIAL: NEGATIVE ug/mL
Oxycodone Screen, Ur: NEGATIVE ng/mL
PROPOXYPHENE: NEGATIVE ng/mL
Phencyclidine, Ur: NEGATIVE ng/mL
Tapentadol, urine: NEGATIVE ng/mL
Tramadol Scrn, Ur: NEGATIVE ng/mL
Zolpidem, Urine: NEGATIVE ng/mL
pH, Initial: 6.1 pH (ref 4.5–8.9)

## 2014-06-15 LAB — OPIATES/OPIOIDS (LC/MS-MS)
Codeine Urine: NEGATIVE ng/mL (ref <50)
HYDROCODONE: 151 ng/mL — AB (ref <50)
Hydromorphone: 2457 ng/mL — AB (ref <50)
Morphine Urine: NEGATIVE ng/mL (ref <50)
NOROXYCODONE, UR: 124 ng/mL — AB (ref <50)
Norhydrocodone, Ur: 363 ng/mL — AB (ref <50)
Oxycodone, ur: 51 ng/mL — AB (ref <50)
Oxymorphone: NEGATIVE ng/mL (ref <50)

## 2014-06-20 ENCOUNTER — Telehealth (HOSPITAL_COMMUNITY): Payer: Self-pay | Admitting: Hematology

## 2014-06-20 NOTE — Telephone Encounter (Signed)
Patient called in to request a bed in [facility] for pain medications and IV fluids for tomorrow.  I explained the process for coming to the [facility] cell center.  Patient states she has been hurting for two days and went to the ED today for treatment.  Patient states she will work on arranging a ride and will call in the morning if she is still in pain.

## 2014-06-26 ENCOUNTER — Telehealth (HOSPITAL_COMMUNITY): Payer: Self-pay | Admitting: Hematology

## 2014-06-26 ENCOUNTER — Encounter (HOSPITAL_COMMUNITY): Payer: Self-pay | Admitting: Hematology

## 2014-06-26 ENCOUNTER — Non-Acute Institutional Stay (HOSPITAL_COMMUNITY)
Admission: AD | Admit: 2014-06-26 | Discharge: 2014-06-26 | Disposition: A | Discharge status: Home / Self Care | Payer: Medicaid Other | Attending: Internal Medicine | Admitting: Internal Medicine

## 2014-06-26 DIAGNOSIS — Z87891 Personal history of nicotine dependence: Secondary | ICD-10-CM | POA: Diagnosis not present

## 2014-06-26 DIAGNOSIS — D571 Sickle-cell disease without crisis: Secondary | ICD-10-CM

## 2014-06-26 DIAGNOSIS — Z885 Allergy status to narcotic agent status: Secondary | ICD-10-CM | POA: Diagnosis not present

## 2014-06-26 DIAGNOSIS — M25559 Pain in unspecified hip: Secondary | ICD-10-CM | POA: Diagnosis present

## 2014-06-26 DIAGNOSIS — D5700 Hb-SS disease with crisis, unspecified: Secondary | ICD-10-CM | POA: Diagnosis not present

## 2014-06-26 DIAGNOSIS — Z79899 Other long term (current) drug therapy: Secondary | ICD-10-CM | POA: Diagnosis not present

## 2014-06-26 LAB — CBC WITH DIFFERENTIAL/PLATELET
BASOS PCT: 0 % (ref 0–1)
Basophils Absolute: 0 10*3/uL (ref 0.0–0.1)
Eosinophils Absolute: 0 10*3/uL (ref 0.0–0.7)
Eosinophils Relative: 0 % (ref 0–5)
HCT: 22.1 % — ABNORMAL LOW (ref 36.0–46.0)
Hemoglobin: 7.7 g/dL — ABNORMAL LOW (ref 12.0–15.0)
LYMPHS ABS: 3 10*3/uL (ref 0.7–4.0)
Lymphocytes Relative: 25 % (ref 12–46)
MCH: 33.3 pg (ref 26.0–34.0)
MCHC: 34.8 g/dL (ref 30.0–36.0)
MCV: 95.7 fL (ref 78.0–100.0)
Monocytes Absolute: 1.2 10*3/uL — ABNORMAL HIGH (ref 0.1–1.0)
Monocytes Relative: 10 % (ref 3–12)
Neutro Abs: 7.6 10*3/uL (ref 1.7–7.7)
Neutrophils Relative %: 65 % (ref 43–77)
Platelets: 326 10*3/uL (ref 150–400)
RBC: 2.31 MIL/uL — ABNORMAL LOW (ref 3.87–5.11)
RDW: 23 % — ABNORMAL HIGH (ref 11.5–15.5)
WBC: 11.8 10*3/uL — AB (ref 4.0–10.5)

## 2014-06-26 LAB — LACTATE DEHYDROGENASE: LDH: 254 U/L — AB (ref 94–250)

## 2014-06-26 LAB — RETICULOCYTES
RBC.: 2.31 MIL/uL — ABNORMAL LOW (ref 3.87–5.11)
RETIC CT PCT: 13.4 % — AB (ref 0.4–3.1)
Retic Count, Absolute: 309.5 10*3/uL — ABNORMAL HIGH (ref 19.0–186.0)

## 2014-06-26 MED ORDER — NALOXONE HCL 0.4 MG/ML IJ SOLN: 0.4000 mg | INTRAMUSCULAR | Status: DC | PRN

## 2014-06-26 MED ORDER — HYDROXYUREA 500 MG PO CAPS: 1000.0000 mg | ORAL_CAPSULE | Freq: Every day | ORAL | Status: DC

## 2014-06-26 MED ORDER — SODIUM CHLORIDE 0.9 % IV SOLN
25.0000 mg | INTRAVENOUS | Status: DC | PRN
  Filled 2014-06-26: qty 0.5

## 2014-06-26 MED ORDER — HYDROMORPHONE HCL 2 MG/ML IJ SOLN
2.0000 mg | Freq: Once | INTRAMUSCULAR | Status: AC
  Administered 2014-06-26: 2 mg via INTRAVENOUS
  Filled 2014-06-26: qty 1

## 2014-06-26 MED ORDER — HEPARIN SOD (PORK) LOCK FLUSH 100 UNIT/ML IV SOLN
500.0000 [IU] | INTRAVENOUS | Status: AC | PRN
  Administered 2014-06-26: 500 [IU]
  Filled 2014-06-26: qty 5

## 2014-06-26 MED ORDER — DIPHENHYDRAMINE HCL 25 MG PO CAPS
25.0000 mg | ORAL_CAPSULE | Freq: Four times a day (QID) | ORAL | Status: DC | PRN
  Administered 2014-06-26: 50 mg via ORAL
  Filled 2014-06-26 (×2): qty 2

## 2014-06-26 MED ORDER — KETOROLAC TROMETHAMINE 30 MG/ML IJ SOLN
30.0000 mg | Freq: Four times a day (QID) | INTRAMUSCULAR | Status: AC
  Administered 2014-06-26 (×2): 30 mg via INTRAVENOUS
  Filled 2014-06-26 (×2): qty 1

## 2014-06-26 MED ORDER — OXYCODONE-ACETAMINOPHEN 5-325 MG PO TABS
1.0000 | ORAL_TABLET | Freq: Once | ORAL | Status: AC
  Administered 2014-06-26: 1 via ORAL
  Filled 2014-06-26: qty 1

## 2014-06-26 MED ORDER — HYDROMORPHONE 2 MG/ML HIGH CONCENTRATION IV PCA SOLN
INTRAVENOUS | Status: DC
  Administered 2014-06-26: 12:00:00 via INTRAVENOUS
  Administered 2014-06-26: 13.5 mg via INTRAVENOUS
  Filled 2014-06-26 (×2): qty 25

## 2014-06-26 MED ORDER — ONDANSETRON HCL 4 MG/2ML IJ SOLN: 4.0000 mg | Freq: Four times a day (QID) | INTRAMUSCULAR | Status: DC | PRN

## 2014-06-26 MED ORDER — HYDROMORPHONE HCL 2 MG/ML IJ SOLN
1.3000 mg | Freq: Once | INTRAMUSCULAR | Status: AC
  Administered 2014-06-26: 1.3 mg via INTRAVENOUS
  Filled 2014-06-26: qty 1

## 2014-06-26 MED ORDER — HYDROMORPHONE HCL 2 MG/ML IJ SOLN
1.6000 mg | Freq: Once | INTRAMUSCULAR | Status: AC
  Administered 2014-06-26: 1.6 mg via INTRAVENOUS
  Filled 2014-06-26: qty 1

## 2014-06-26 MED ORDER — DEXTROSE-NACL 5-0.45 % IV SOLN
INTRAVENOUS | Status: DC
  Administered 2014-06-26: 10:00:00 via INTRAVENOUS

## 2014-06-26 MED ORDER — SODIUM CHLORIDE 0.9 % IJ SOLN
10.0000 mL | INTRAMUSCULAR | Status: AC | PRN
  Administered 2014-06-26: 10 mL

## 2014-06-26 MED ORDER — SODIUM CHLORIDE 0.9 % IJ SOLN: 9.0000 mL | INTRAMUSCULAR | Status: DC | PRN

## 2014-06-26 MED ORDER — OXYCODONE HCL 5 MG PO TABS
5.0000 mg | ORAL_TABLET | Freq: Once | ORAL | Status: AC
  Administered 2014-06-26: 5 mg via ORAL
  Filled 2014-06-26: qty 1

## 2014-06-26 NOTE — H&P (Signed)
[facility] History and Physical   Date: 06/26/2014  Patient name: Brittany Foster Lalley Medical record number: [id number] Date of birth: 02/01/1986 Age: 29 y.o. Gender: female PCP: [name],[name], MD  Attending physician: Altha HarmMichelle A [name], MD  Chief Complaint: Chest and hip pain  History of Present Illness: Brittany Foster, 29 year old female with a history of sickle cell anemia, HbSS presents with pain to right chest and hips that is consistent with sickle cell pain. Patient states that pain started on 06/25/2014 and has been increasing. Pain intensity is currently 8/10, which is described as sharp and constant. She last had Percocet 10-325 mg around 4 am with minimal relief. She reports that she is taking medication regimen consistently. Patient denies fatigue, shortness of breath, abdominal pain, or diarrhea. Ms. [name] reports periodic nausea. Will admit to day infusion center for extended observation. Meds: Prescriptions prior to admission  Medication Sig Dispense Refill Last Dose  . folic acid (FOLVITE) 1 MG tablet Take 1 tablet (1 mg total) by mouth daily. 30 tablet 11 06/25/2014 at Unknown time  . gabapentin (NEURONTIN) 300 MG capsule Take 1 capsule (300 mg total) by mouth 3 (three) times daily. 90 capsule 2 06/25/2014 at Unknown time  . hydroxyurea (HYDREA) 500 MG capsule Take 2 capsules (1,000 mg total) by mouth daily. May take with food to minimize GI side effects. 60 capsule 3 Past Week at Unknown time  . Naproxen Sodium (ALEVE) 220 MG CAPS Take 660 mg by mouth every 6 (six) hours as needed (pain).   06/26/2014 at Unknown time  . oxyCODONE-acetaminophen (PERCOCET) 10-325 MG per tablet Take 1 tablet by mouth every 6 (six) hours as needed for pain. 100 tablet 0 06/26/2014 at Unknown time  . Topiramate ER 100 MG CP24 Take 100 mg by mouth at bedtime.   06/25/2014 at Unknown time    Allergies: Ultram and Morphine and related Past Medical History  Diagnosis Date  .  Sickle cell anemia    Past Surgical History  Procedure Laterality Date  . Tubal ligation    . Cholecystectomy    . Cesarean section    . Port a cath placement Right     about 6-7 years ago  . Cholecystectomy  2000   Family History  Problem Relation Age of Onset  . Sickle cell anemia Other   . Sickle cell trait Father   . Sickle cell trait Mother    History   Social History  . Marital Status: Single    Spouse Name: N/A  . Number of Children: N/A  . Years of Education: N/A   Occupational History  . Not on file.   Social History Main Topics  . Smoking status: Former Games developermoker  . Smokeless tobacco: Not on file     Comment: smokes once every couple of weeks.   . Alcohol Use: No  . Drug Use: No  . Sexual Activity: Not on file   Other Topics Concern  . Not on file   Social History Narrative    Review of Systems: Constitutional: positive for fatigue Eyes: negative Ears, nose, mouth, throat, and face: negative Respiratory: negative Cardiovascular: negative Gastrointestinal: negative Genitourinary:negative Integument/breast: negative Hematologic/lymphatic: negative Musculoskeletal:positive for myalgias Neurological: negative Endocrine: negative  Physical Exam: Blood pressure 98/57, pulse 76, temperature 98.4 F (36.9 C), temperature source Oral, resp. rate 18, last menstrual period 06/18/2014, SpO2 99 %. General appearance: alert, cooperative and mild distress Head: Normocephalic, without obvious abnormality, atraumatic Eyes: conjunctivae/corneas clear.  PERRL, EOM's intact. Fundi benign. Ears: normal TM's and external ear canals both ears Nose: Nares normal. Septum midline. Mucosa normal. No drainage or sinus tenderness. Throat: lips, mucosa, and tongue normal; teeth and gums normal Neck: no adenopathy, no carotid bruit, no JVD, supple, symmetrical, trachea midline and thyroid not enlarged, symmetric, no tenderness/mass/nodules Back: symmetric, no curvature. ROM  normal. No CVA tenderness. Lungs: clear to auscultation bilaterally Heart: regular rate and rhythm, S1, S2 normal, no murmur, click, rub or gallop Abdomen: soft, non-tender; bowel sounds normal; no masses,  no organomegaly Extremities: extremities normal, atraumatic, no cyanosis or edema Pulses: 2+ and symmetric Neurologic: Alert and oriented X 3, normal strength and tone. Normal symmetric reflexes. Normal coordination and gait  Lab results: Results for orders placed or performed during the hospital encounter of 06/26/14 (from the past 24 hour(s))  Lactate dehydrogenase     Status: Abnormal   Collection Time: 06/26/14  9:50 AM  Result Value Ref Range   LDH 254 (H) 94 - 250 U/L  CBC WITH DIFFERENTIAL     Status: Abnormal   Collection Time: 06/26/14  9:50 AM  Result Value Ref Range   WBC 11.8 (H) 4.0 - 10.5 K/uL   RBC 2.31 (L) 3.87 - 5.11 MIL/uL   Hemoglobin 7.7 (L) 12.0 - 15.0 g/dL   HCT 04.5 (L) 40.9 - 81.1 %   MCV 95.7 78.0 - 100.0 fL   MCH 33.3 26.0 - 34.0 pg   MCHC 34.8 30.0 - 36.0 g/dL   RDW 91.4 (H) 78.2 - 95.6 %   Platelets 326 150 - 400 K/uL   Neutrophils Relative % 65 43 - 77 %   Lymphocytes Relative 25 12 - 46 %   Monocytes Relative 10 3 - 12 %   Eosinophils Relative 0 0 - 5 %   Basophils Relative 0 0 - 1 %   Neutro Abs 7.6 1.7 - 7.7 K/uL   Lymphs Abs 3.0 0.7 - 4.0 K/uL   Monocytes Absolute 1.2 (H) 0.1 - 1.0 K/uL   Eosinophils Absolute 0.0 0.0 - 0.7 K/uL   Basophils Absolute 0.0 0.0 - 0.1 K/uL   RBC Morphology POLYCHROMASIA PRESENT   Reticulocytes     Status: Abnormal   Collection Time: 06/26/14  9:50 AM  Result Value Ref Range   Retic Ct Pct 13.4 (H) 0.4 - 3.1 %   RBC. 2.31 (L) 3.87 - 5.11 MIL/uL   Retic Count, Manual 309.5 (H) 19.0 - 186.0 K/uL    Imaging results:  No results found.   Assessment & Plan:Pa Start hypotonic IVFs for cellular rehydration Check CBCw/differential, CMP, LDH, and reticulocytes Start Toradol 30 mg IV to decrease  inflammation Start IV dilaudid per weight based rapid redosing. If pain intensity remains elevated, will transition to high concentration PCA.  Plan outlined and reviewed by Dr. Marthann Schiller   [facility] M 06/26/2014, 12:10 PM

## 2014-06-26 NOTE — Progress Notes (Signed)
Pt discharged home ambulatory. Pt a&ox4, pt in no distress. Pt received all belongings, and discharged instructions, pt verbalized understanding of all discharge instructions. Pt informed of her follow-up appointment, pt stated she needs an appt sooner than May 20, pt encouraged to call clinic tomorrow during business hours to make any appt changes, pt verbalized understanding. Pt denies any further needs at this time. Rt chest port, flushed, packed with heparin, and de-accessed.

## 2014-06-26 NOTE — Discharge Summary (Signed)
Physician Discharge Summary  Brittany Foster ZOX:[id number] DOB: 10/02/1985 DOA: 06/26/2014  PCP: [name],[name], MD  Admit date: 06/26/2014 Discharge date: 06/26/2014  Discharge Diagnoses:  Active Problems:   Sickle cell pain crisis   Discharge Condition: Good  Disposition: Home  Diet: Regular  Wt Readings from Last 3 Encounters:  06/11/14 122 lb (55.339 kg)  04/29/14 119 lb 0.8 oz (54 kg)  04/19/14 112 lb (50.803 kg)    History of present illness:  [name], 29 year old female with a history of sickle cell anemia, HbSS presents with pain to right chest and hips that is consistent with sickle cell pain. Patient states that pain started on 06/25/2014 and has been increasing. Pain intensity is currently 8/10, which is described as sharp and constant. She last had Percocet 10-325 mg around 4 am with minimal relief. She reports that she is taking medication regimen consistently. Patient denies fatigue, shortness of breath, abdominal pain, or diarrhea. Brittany Foster reports periodic nausea. Will admit to day infusion center for extended observation.  Hospital Course:  Pt was initially treated with weight based rapid re-dosing IVF and Toradol . Pain remained above 7/10 and Pt was transitioned to a weight based Dilaudid PCA . Patient's pain decreased to 5/10  And her function was at baseline at the time of discharge. She is discharged home in stable condition.    Discharge Exam:  Filed Vitals:   06/26/14 1535  BP: 88/47  Pulse: 72  Temp: 98.2 F (36.8 C)  Resp: 20   Filed Vitals:   06/26/14 1224 06/26/14 1330 06/26/14 1440 06/26/14 1535  BP:   Pulse:  63 73 72  Temp:  98.2 F (36.8 C) 98.2 F (36.8 C) 98.2 F (36.8 C)  TempSrc:  Oral Oral Oral  Resp: SpO2: 96% 96% 96% 98%     General: Alert, awake, oriented x3, in no acute distress.  HEENT: [place]/AT PEERL, EOMI Neck: Trachea midline,  no masses, no thyromegal,y no JVD, no carotid  bruit OROPHARYNX:  Moist, No exudate/ erythema/lesions.  Heart: Regular rate and rhythm, without murmurs, rubs, gallops, PMI non-displaced, no heaves or thrills on palpation.  Lungs: Clear to auscultation, no wheezing or rhonchi noted. No increased vocal fremitus resonant to percussion  Abdomen: Soft, nontender, nondistended, positive bowel sounds, no masses no hepatosplenomegaly noted..  Neuro: No focal neurological deficits noted cranial nerves II through XII grossly intact. DTRs 2+ bilaterally upper and lower extremities. Strength normal in bilateral upper and lower extremities. Musculoskeletal: No warm swelling or erythema around joints, no spinal tenderness noted. Psychiatric: Patient alert and oriented x3, good insight and cognition, good recent to remote recall. Lymph node survey: No cervical axillary or inguinal lymphadenopathy noted.   Discharge Instructions  Discharge Instructions    Diet general    Complete by:  As directed      Increase activity slowly    Complete by:  As directed             Medication List    TAKE these medications        ALEVE 220 MG Caps  Generic drug:  Naproxen Sodium  Take 660 mg by mouth every 6 (six) hours as needed (pain).     folic acid 1 MG tablet  Commonly known as:  FOLVITE  Take 1 tablet (1 mg total) by mouth daily.     gabapentin 300 MG capsule  Commonly known as:  NEURONTIN  Take  1 capsule (300 mg total) by mouth 3 (three) times daily.     hydroxyurea 500 MG capsule  Commonly known as:  HYDREA  Take 2 capsules (1,000 mg total) by mouth daily. May take with food to minimize GI side effects.     oxyCODONE-acetaminophen 10-325 MG per tablet  Commonly known as:  PERCOCET  Take 1 tablet by mouth every 6 (six) hours as needed for pain.     Topiramate ER 100 MG Cp24  Take 100 mg by mouth at bedtime.          The results of significant diagnostics from this hospitalization (including imaging, microbiology, ancillary and  laboratory) are listed below for reference.    Significant Diagnostic Studies: No results found.  Microbiology: No results found for this or any previous visit (from the past 240 hour(s)).   Labs: Basic Metabolic Panel: No results for input(s): NA, K, CL, CO2, GLUCOSE, BUN, CREATININE, CALCIUM, MG, PHOS in the last 168 hours. Liver Function Tests: No results for input(s): AST, ALT, ALKPHOS, BILITOT, PROT, ALBUMIN in the last 168 hours. No results for input(s): LIPASE, AMYLASE in the last 168 hours. No results for input(s): AMMONIA in the last 168 hours. CBC:  Recent Labs Lab 06/26/14 0950  WBC 11.8*  NEUTROABS 7.6  HGB 7.7*  HCT 22.1*  MCV 95.7  PLT 326   Cardiac Enzymes: No results for input(s): CKTOTAL, CKMB, CKMBINDEX, TROPONINI in the last 168 hours. BNP: Invalid input(s): POCBNP CBG: No results for input(s): GLUCAP in the last 168 hours. Ferritin: No results for input(s): FERRITIN in the last 168 hours.  Time coordinating discharge: 35 minutes  Signed:  [name],[name]  06/26/2014, 5:43 PM

## 2014-06-26 NOTE — Telephone Encounter (Signed)
Patient C/O pain to legs, hip, and chest.  Rates pain 8/10 on pain scale.  Denies any radiating of chest pain.  Denies vomiting, C/O Nausea.  Denies abdominal pain or diarrhea.  Patient states she has taken Oxycodone around 3a.m., alleve around 7a.m.  I advised I would notify the physician and give her a call back.  Patient verbalizes understanding.

## 2014-06-26 NOTE — Telephone Encounter (Signed)
Advised patient I spoke with Dr.[name] and [facility].  Patient verbalizes understanding. Patient states she is coming [place] service and they can not pick her up until 10:45.  MD notified

## 2014-06-27 ENCOUNTER — Telehealth (HOSPITAL_COMMUNITY): Payer: Self-pay | Admitting: *Deleted

## 2014-06-27 NOTE — Telephone Encounter (Signed)
Patient advised to come to sickle cell cell center per Dr. Ashley RoyaltyMatthews.

## 2014-06-27 NOTE — Telephone Encounter (Signed)
Chief complaint: hip,back, stomach and  chest pain 8 of 10 - sharp shooting.Advice patient that I MD will be notified.

## 2014-06-28 ENCOUNTER — Encounter (HOSPITAL_COMMUNITY): Payer: Self-pay

## 2014-06-28 ENCOUNTER — Non-Acute Institutional Stay (HOSPITAL_COMMUNITY)
Admission: AD | Admit: 2014-06-28 | Discharge: 2014-06-28 | Disposition: A | Discharge status: Home / Self Care | Payer: Medicaid Other | Attending: Internal Medicine | Admitting: Internal Medicine

## 2014-06-28 DIAGNOSIS — D571 Sickle-cell disease without crisis: Secondary | ICD-10-CM | POA: Insufficient documentation

## 2014-06-28 DIAGNOSIS — D5700 Hb-SS disease with crisis, unspecified: Secondary | ICD-10-CM | POA: Diagnosis present

## 2014-06-28 LAB — CBC WITH DIFFERENTIAL/PLATELET
Basophils Absolute: 0 10*3/uL (ref 0.0–0.1)
Basophils Relative: 0 % (ref 0–1)
Eosinophils Absolute: 0 10*3/uL (ref 0.0–0.7)
Eosinophils Relative: 0 % (ref 0–5)
HCT: 22.1 % — ABNORMAL LOW (ref 36.0–46.0)
Hemoglobin: 7.5 g/dL — ABNORMAL LOW (ref 12.0–15.0)
Lymphocytes Relative: 31 % (ref 12–46)
Lymphs Abs: 3.7 10*3/uL (ref 0.7–4.0)
MCH: 33.5 pg (ref 26.0–34.0)
MCHC: 33.9 g/dL (ref 30.0–36.0)
MCV: 98.7 fL (ref 78.0–100.0)
MONOS PCT: 8 % (ref 3–12)
Monocytes Absolute: 1 10*3/uL (ref 0.1–1.0)
NEUTROS ABS: 7.2 10*3/uL (ref 1.7–7.7)
NRBC: 2 /100{WBCs} — AB
Neutrophils Relative %: 61 % (ref 43–77)
PLATELETS: 347 10*3/uL (ref 150–400)
RBC: 2.24 MIL/uL — AB (ref 3.87–5.11)
RDW: 25.6 % — ABNORMAL HIGH (ref 11.5–15.5)
WBC: 11.9 10*3/uL — ABNORMAL HIGH (ref 4.0–10.5)

## 2014-06-28 LAB — COMPREHENSIVE METABOLIC PANEL
ALBUMIN: 4.4 g/dL (ref 3.5–5.2)
ALK PHOS: 93 U/L (ref 39–117)
ALT: 22 U/L (ref 0–35)
AST: 38 U/L — AB (ref 0–37)
Anion gap: 5 (ref 5–15)
BILIRUBIN TOTAL: 2.2 mg/dL — AB (ref 0.3–1.2)
BUN: 7 mg/dL (ref 6–23)
CALCIUM: 8.5 mg/dL (ref 8.4–10.5)
CO2: 23 mmol/L (ref 19–32)
Chloride: 111 mmol/L (ref 96–112)
Creatinine, Ser: 0.39 mg/dL — ABNORMAL LOW (ref 0.50–1.10)
GFR calc Af Amer: >90 mL/min (ref >90)
GFR calc non Af Amer: >90 mL/min (ref >90)
Glucose, Bld: 121 mg/dL — ABNORMAL HIGH (ref 70–99)
Potassium: 2.9 mmol/L — ABNORMAL LOW (ref 3.5–5.1)
SODIUM: 139 mmol/L (ref 135–145)
TOTAL PROTEIN: 7 g/dL (ref 6.0–8.3)

## 2014-06-28 LAB — RETICULOCYTES
RBC.: 2.24 MIL/uL — ABNORMAL LOW (ref 3.87–5.11)
RETIC CT PCT: 20.6 % — AB (ref 0.4–3.1)
Retic Count, Absolute: 461.4 10*3/uL — ABNORMAL HIGH (ref 19.0–186.0)

## 2014-06-28 LAB — LACTATE DEHYDROGENASE: LDH: 265 U/L — AB (ref 94–250)

## 2014-06-28 MED ORDER — ONDANSETRON HCL 4 MG/2ML IJ SOLN: 4.0000 mg | Freq: Four times a day (QID) | INTRAMUSCULAR | Status: DC | PRN

## 2014-06-28 MED ORDER — SODIUM CHLORIDE 0.9 % IJ SOLN: 9.0000 mL | INTRAMUSCULAR | Status: DC | PRN

## 2014-06-28 MED ORDER — FOLIC ACID 1 MG PO TABS: 1.0000 mg | ORAL_TABLET | Freq: Every day | ORAL | Status: DC

## 2014-06-28 MED ORDER — DIPHENHYDRAMINE HCL 12.5 MG/5ML PO ELIX
12.5000 mg | ORAL_SOLUTION | Freq: Four times a day (QID) | ORAL | Status: DC | PRN
  Administered 2014-06-28: 12.5 mg via ORAL
  Filled 2014-06-28: qty 5

## 2014-06-28 MED ORDER — KETOROLAC TROMETHAMINE 30 MG/ML IJ SOLN
30.0000 mg | Freq: Four times a day (QID) | INTRAMUSCULAR | Status: DC
  Administered 2014-06-28: 30 mg via INTRAVENOUS
  Filled 2014-06-28: qty 1

## 2014-06-28 MED ORDER — HYDROMORPHONE 2 MG/ML HIGH CONCENTRATION IV PCA SOLN
INTRAVENOUS | Status: DC
  Administered 2014-06-28: 5.5 mg via INTRAVENOUS
  Administered 2014-06-28: 10.5 mg via INTRAVENOUS
  Administered 2014-06-28: 09:00:00 via INTRAVENOUS
  Filled 2014-06-28: qty 25

## 2014-06-28 MED ORDER — POLYETHYLENE GLYCOL 3350 17 G PO PACK: 17.0000 g | PACK | Freq: Every day | ORAL | Status: DC | PRN

## 2014-06-28 MED ORDER — OXYCODONE HCL 5 MG PO TABS
5.0000 mg | ORAL_TABLET | Freq: Once | ORAL | Status: AC
  Administered 2014-06-28: 5 mg via ORAL
  Filled 2014-06-28: qty 1

## 2014-06-28 MED ORDER — SODIUM CHLORIDE 0.9 % IV SOLN
12.5000 mg | Freq: Four times a day (QID) | INTRAVENOUS | Status: DC | PRN
  Filled 2014-06-28: qty 0.25

## 2014-06-28 MED ORDER — SENNOSIDES-DOCUSATE SODIUM 8.6-50 MG PO TABS: 1.0000 | ORAL_TABLET | Freq: Two times a day (BID) | ORAL | Status: DC

## 2014-06-28 MED ORDER — NALOXONE HCL 0.4 MG/ML IJ SOLN: 0.4000 mg | INTRAMUSCULAR | Status: DC | PRN

## 2014-06-28 MED ORDER — SODIUM CHLORIDE 0.9 % IJ SOLN: 10.0000 mL | INTRAMUSCULAR | Status: DC | PRN

## 2014-06-28 MED ORDER — DEXTROSE-NACL 5-0.45 % IV SOLN
INTRAVENOUS | Status: DC
  Administered 2014-06-28: 09:00:00 via INTRAVENOUS

## 2014-06-28 MED ORDER — OXYCODONE-ACETAMINOPHEN 5-325 MG PO TABS
1.0000 | ORAL_TABLET | Freq: Once | ORAL | Status: AC
  Administered 2014-06-28: 1 via ORAL
  Filled 2014-06-28: qty 1

## 2014-06-28 MED ORDER — HEPARIN SOD (PORK) LOCK FLUSH 100 UNIT/ML IV SOLN
500.0000 [IU] | INTRAVENOUS | Status: AC | PRN
  Administered 2014-06-28: 500 [IU]
  Filled 2014-06-28: qty 5

## 2014-06-28 NOTE — Progress Notes (Signed)
[name], 29 year old female with a history of sickle cell anemia, HbSS presents with acute sickle cell pain. Pain is primarily located in right chest and lower back.  Patient states that pain intensity has been 8/10, throbbing and constant for the past 4 days. She states that she was [facility]2014, given IV pain medication and discharged home. She [place] and  did not have transportation on yesterday. Patient states that she last had pain medication around 6 am with minimal relief.   Patient assessed, pain intensity reduced to 6/10. Will discontinue high concentration PCA and give Percocet 10-325 mg times one. Will re-assess pain intensity prior to discharge. Plan discussed and reviewed with Dr. Lonia BloodLawal Garba.   Massie MaroonHollis,Lachina M, FNP

## 2014-06-28 NOTE — Progress Notes (Signed)
Pt arrived at day hospital and states experiencing pain in hip, back and stomach; rates pain 8/10; pt states that [facility] for treatment for pain crisis; pt alert, oriented, ambulatory; MD notified of her arrival; will continue to monitor

## 2014-06-28 NOTE — Discharge Summary (Signed)
Physician Discharge Summary  Patient ID: Brittany Foster Cancel MRN: [id number] DOB/AGE: 30/03/1985 30 y.o.  Admit date: 06/28/2014 Discharge date: 06/28/2014  Admission Diagnoses:  Discharge Diagnoses:  Active Problems:   Sickle cell pain crisis   Sickle cell anemia with crisis   Discharged Condition: good  Hospital Course: Patient was admitted to the day hospital. She was treated with IV Dilaudid PCA, Toradol and IV fluids. She is always difficult to get and are controlled. At the time of discharge however she was stable and pain was at 5 out of 10. She was discharged home to resume her home medications and follow with Dr. Ashley RoyaltyMatthews.  Consults: None  Significant Diagnostic Studies: labs: CBC and CMP checked. She is at her baseline  Treatments: IV hydration and analgesia: Dilaudid  Discharge Exam: Blood pressure 104/61, pulse 63, temperature 97.8 F (36.6 C), temperature source Oral, resp. rate 16, height 5' (1.524 m), weight 51.256 kg (113 lb), last menstrual period 06/18/2014, SpO2 99 %. General appearance: alert, cooperative and no distress Head: Normocephalic, without obvious abnormality, atraumatic Eyes: conjunctivae/corneas clear. PERRL, EOM's intact. Fundi benign. Neck: no adenopathy, no carotid bruit, no JVD, supple, symmetrical, trachea midline and thyroid not enlarged, symmetric, no tenderness/mass/nodules Back: symmetric, no curvature. ROM normal. No CVA tenderness. Resp: clear to auscultation bilaterally Chest wall: no tenderness Cardio: regular rate and rhythm, S1, S2 normal, no murmur, click, rub or gallop GI: soft, non-tender; bowel sounds normal; no masses,  no organomegaly Extremities: extremities normal, atraumatic, no cyanosis or edema Pulses: 2+ and symmetric Skin: Skin color, texture, turgor normal. No rashes or lesions Neurologic: Grossly normal  Disposition: 01-Home or Self Care     Medication List    TAKE these medications        ALEVE 220 MG Caps   Generic drug:  Naproxen Sodium  Take 660 mg by mouth every 6 (six) hours as needed (pain).     folic acid 1 MG tablet  Commonly known as:  FOLVITE  Take 1 tablet (1 mg total) by mouth daily.     gabapentin 300 MG capsule  Commonly known as:  NEURONTIN  Take 1 capsule (300 mg total) by mouth 3 (three) times daily.     hydroxyurea 500 MG capsule  Commonly known as:  HYDREA  Take 2 capsules (1,000 mg total) by mouth daily. May take with food to minimize GI side effects.     oxyCODONE-acetaminophen 10-325 MG per tablet  Commonly known as:  PERCOCET  Take 1 tablet by mouth every 6 (six) hours as needed for pain.     Topiramate ER 100 MG Cp24  Take 100 mg by mouth at bedtime.         SignedLonia Blood: GARBA,LAWAL 06/28/2014, 3:33 PM

## 2014-06-28 NOTE — H&P (Signed)
Brittany Foster is an 29 y.o. female.   Chief Complaint: Pain all over for 4 days HPI: A 29 year old female with history of sickle cell disease and chronic pain who has been in and out of the hospital several he [facility] where she was yesterday coming today because she has "appointment" at the day hospital. She has apparently been having pain typical of her sickle cell pain for the last 4 days. She called yesterday to try on coming to the day hospital but could not make it in. She was asked to come today where she'll be seen. Patient decided to come in. She is describing her pain as 9 out of 10 all over the body. It is not relieved by her home medications. It is worsened by any activity. She denies shortness of breath, no cough, no nausea vomiting or diarrhea.   Past Medical History  Diagnosis Date  . Sickle cell anemia     Past Surgical History  Procedure Laterality Date  . Tubal ligation    . Cholecystectomy    . Cesarean section    . Port a cath placement Right     about 6-7 years ago  . Cholecystectomy  2000    Family History  Problem Relation Age of Onset  . Sickle cell anemia Other   . Sickle cell trait Father   . Sickle cell trait Mother    Social History:  reports that she has quit smoking. She does not have any smokeless tobacco history on file. She reports that she does not drink alcohol or use illicit drugs.  Allergies:  Allergies  Allergen Reactions  . Ultram [Tramadol] Other (See Comments)    seizures  . Morphine And Related Hives, Rash and Other (See Comments)    shaking    Medications Prior to Admission  Medication Sig Dispense Refill  . folic acid (FOLVITE) 1 MG tablet Take 1 tablet (1 mg total) by mouth daily. 30 tablet 11  . gabapentin (NEURONTIN) 300 MG capsule Take 1 capsule (300 mg total) by mouth 3 (three) times daily. 90 capsule 2  . Naproxen Sodium (ALEVE) 220 MG CAPS Take 660 mg by mouth every 6 (six) hours as needed (pain).    Marland Kitchen.  oxyCODONE-acetaminophen (PERCOCET) 10-325 MG per tablet Take 1 tablet by mouth every 6 (six) hours as needed for pain. 100 tablet 0  . Topiramate ER 100 MG CP24 Take 100 mg by mouth at bedtime.    . hydroxyurea (HYDREA) 500 MG capsule Take 2 capsules (1,000 mg total) by mouth daily. May take with food to minimize GI side effects. 60 capsule 3    Results for orders placed or performed during the hospital encounter of 06/26/14 (from the past 48 hour(s))  Lactate dehydrogenase     Status: Abnormal   Collection Time: 06/26/14  9:50 AM  Result Value Ref Range   LDH 254 (H) 94 - 250 U/L  CBC WITH DIFFERENTIAL     Status: Abnormal   Collection Time: 06/26/14  9:50 AM  Result Value Ref Range   WBC 11.8 (H) 4.0 - 10.5 K/uL   RBC 2.31 (L) 3.87 - 5.11 MIL/uL   Hemoglobin 7.7 (L) 12.0 - 15.0 g/dL   HCT 16.122.1 (L) 09.636.0 - 04.546.0 %   MCV 95.7 78.0 - 100.0 fL   MCH 33.3 26.0 - 34.0 pg   MCHC 34.8 30.0 - 36.0 g/dL   RDW 40.923.0 (H) 81.111.5 - 91.415.5 %   Platelets 326 150 -  400 K/uL   Neutrophils Relative % 65 43 - 77 %   Lymphocytes Relative 25 12 - 46 %   Monocytes Relative 10 3 - 12 %   Eosinophils Relative 0 0 - 5 %   Basophils Relative 0 0 - 1 %   Neutro Abs 7.6 1.7 - 7.7 K/uL   Lymphs Abs 3.0 0.7 - 4.0 K/uL   Monocytes Absolute 1.2 (H) 0.1 - 1.0 K/uL   Eosinophils Absolute 0.0 0.0 - 0.7 K/uL   Basophils Absolute 0.0 0.0 - 0.1 K/uL   RBC Morphology POLYCHROMASIA PRESENT     Comment: TARGET CELLS SICKLE CELLS   Reticulocytes     Status: Abnormal   Collection Time: 06/26/14  9:50 AM  Result Value Ref Range   Retic Ct Pct 13.4 (H) 0.4 - 3.1 %   RBC. 2.31 (L) 3.87 - 5.11 MIL/uL   Retic Count, Manual 309.5 (H) 19.0 - 186.0 K/uL   No results found.  Review of Systems  Constitutional: Negative.   Eyes: Negative.   Respiratory: Negative.   Cardiovascular: Negative.   Gastrointestinal: Negative.   Genitourinary: Negative.   Musculoskeletal: Positive for myalgias, joint pain and neck pain.  Skin:  Negative.   Neurological: Negative.   Endo/Heme/Allergies: Negative.   Psychiatric/Behavioral: Negative.     Blood pressure 106/69, pulse 84, temperature 98.4 F (36.9 C), temperature source Oral, resp. rate 18, height 5' (1.524 m), weight 51.256 kg (113 lb), last menstrual period 06/18/2014, SpO2 99 %. Physical Exam  Constitutional: She is oriented to person, place, and time. She appears well-developed and well-nourished.  HENT:  Head: Normocephalic and atraumatic.  Right Ear: External ear normal.  Mouth/Throat: Oropharynx is clear and moist.  Eyes: Conjunctivae and EOM are normal. Pupils are equal, round, and reactive to light.  Neck: Normal range of motion. Neck supple.  Cardiovascular: Normal rate, regular rhythm, normal heart sounds and intact distal pulses.   Respiratory: Effort normal and breath sounds normal.  GI: Soft. Bowel sounds are normal.  Musculoskeletal: Normal range of motion. She exhibits tenderness.  Neurological: She is alert and oriented to person, place, and time. She has normal reflexes.  Skin: Skin is warm and dry.     Assessment/Plan A 29 year old female here with sickle cell painful crisis. Next  #1 sickle cell painful crisis: Patient will be placed  on IV Dilaudid PCA, Toradol and IV fluids. We will continue conservative measures until she is ready to go home.  #2 sickle cell anemia: Patient's H&H will be monitored and checked.  #3 chronic pain: Patient has been to multiple hospitals for pain control including [facility]. She is being counselled.   GARBA,LAWAL 06/28/2014, 8:44 AM

## 2014-06-28 NOTE — Progress Notes (Signed)
Pt discharged to home; pt refused to hear discharge instructions or sign form; port deaccessed per protocol; pt alert, oriented, ambulatory; no complications noted

## 2014-06-28 NOTE — Progress Notes (Signed)
Pt requests to be discharged immediately; notified patient that we are awaiting further discharge orders from the physician; pt states that she does not want to wait; MD notified

## 2014-07-03 ENCOUNTER — Telehealth (HOSPITAL_COMMUNITY): Payer: Self-pay | Admitting: Hematology

## 2014-07-03 ENCOUNTER — Non-Acute Institutional Stay (HOSPITAL_COMMUNITY)
Admission: AD | Admit: 2014-07-03 | Discharge: 2014-07-03 | Disposition: A | Discharge status: Home / Self Care | Payer: Medicaid Other | Attending: Internal Medicine | Admitting: Internal Medicine

## 2014-07-03 ENCOUNTER — Encounter (HOSPITAL_COMMUNITY): Payer: Self-pay | Admitting: *Deleted

## 2014-07-03 DIAGNOSIS — Z79899 Other long term (current) drug therapy: Secondary | ICD-10-CM | POA: Insufficient documentation

## 2014-07-03 DIAGNOSIS — Z791 Long term (current) use of non-steroidal anti-inflammatories (NSAID): Secondary | ICD-10-CM | POA: Insufficient documentation

## 2014-07-03 DIAGNOSIS — Z79891 Long term (current) use of opiate analgesic: Secondary | ICD-10-CM | POA: Insufficient documentation

## 2014-07-03 DIAGNOSIS — D5700 Hb-SS disease with crisis, unspecified: Secondary | ICD-10-CM | POA: Insufficient documentation

## 2014-07-03 DIAGNOSIS — Z87891 Personal history of nicotine dependence: Secondary | ICD-10-CM | POA: Insufficient documentation

## 2014-07-03 DIAGNOSIS — M79605 Pain in left leg: Secondary | ICD-10-CM | POA: Diagnosis present

## 2014-07-03 LAB — CBC WITH DIFFERENTIAL/PLATELET
Basophils Absolute: 0 10*3/uL (ref 0.0–0.1)
Basophils Relative: 0 % (ref 0–1)
EOS ABS: 0.1 10*3/uL (ref 0.0–0.7)
EOS PCT: 1 % (ref 0–5)
HCT: 23.5 % — ABNORMAL LOW (ref 36.0–46.0)
Hemoglobin: 7.9 g/dL — ABNORMAL LOW (ref 12.0–15.0)
LYMPHS ABS: 3.7 10*3/uL (ref 0.7–4.0)
Lymphocytes Relative: 36 % (ref 12–46)
MCH: 33.1 pg (ref 26.0–34.0)
MCHC: 33.6 g/dL (ref 30.0–36.0)
MCV: 98.3 fL (ref 78.0–100.0)
MONOS PCT: 11 % (ref 3–12)
Monocytes Absolute: 1.1 10*3/uL — ABNORMAL HIGH (ref 0.1–1.0)
NEUTROS ABS: 5.4 10*3/uL (ref 1.7–7.7)
NEUTROS PCT: 52 % (ref 43–77)
PLATELETS: 290 10*3/uL (ref 150–400)
RBC: 2.39 MIL/uL — AB (ref 3.87–5.11)
RDW: 22.2 % — AB (ref 11.5–15.5)
WBC: 10.3 10*3/uL (ref 4.0–10.5)
nRBC: 2 /100 WBC — ABNORMAL HIGH

## 2014-07-03 LAB — RETICULOCYTES
RBC.: 2.39 MIL/uL — ABNORMAL LOW (ref 3.87–5.11)
Retic Count, Absolute: 389.6 10*3/uL — ABNORMAL HIGH (ref 19.0–186.0)
Retic Ct Pct: 16.3 % — ABNORMAL HIGH (ref 0.4–3.1)

## 2014-07-03 LAB — BASIC METABOLIC PANEL
Anion gap: 7 (ref 5–15)
BUN: 6 mg/dL (ref 6–20)
CALCIUM: 9 mg/dL (ref 8.9–10.3)
CO2: 21 mmol/L — AB (ref 22–32)
Chloride: 113 mmol/L — ABNORMAL HIGH (ref 101–111)
Creatinine, Ser: 0.34 mg/dL — ABNORMAL LOW (ref 0.44–1.00)
GFR calc Af Amer: >60 mL/min (ref >60)
Glucose, Bld: 87 mg/dL (ref 70–99)
POTASSIUM: 3.3 mmol/L — AB (ref 3.5–5.1)
Sodium: 141 mmol/L (ref 135–145)

## 2014-07-03 LAB — LACTATE DEHYDROGENASE: LDH: 263 U/L — AB (ref 98–192)

## 2014-07-03 MED ORDER — HYDROMORPHONE HCL 2 MG/ML IJ SOLN
1.6000 mg | Freq: Once | INTRAMUSCULAR | Status: AC
  Administered 2014-07-03: 1.6 mg via INTRAVENOUS
  Filled 2014-07-03: qty 1

## 2014-07-03 MED ORDER — SODIUM CHLORIDE 0.9 % IJ SOLN: 9.0000 mL | INTRAMUSCULAR | Status: DC | PRN

## 2014-07-03 MED ORDER — OXYCODONE-ACETAMINOPHEN 5-325 MG PO TABS
1.0000 | ORAL_TABLET | Freq: Once | ORAL | Status: AC
Start: 2014-07-03 — End: 2014-07-03
  Administered 2014-07-03: 1 via ORAL
  Filled 2014-07-03: qty 1

## 2014-07-03 MED ORDER — POTASSIUM CHLORIDE CRYS ER 20 MEQ PO TBCR
40.0000 meq | EXTENDED_RELEASE_TABLET | Freq: Once | ORAL | Status: AC
Start: 2014-07-03 — End: 2014-07-03
  Administered 2014-07-03: 40 meq via ORAL
  Filled 2014-07-03: qty 2

## 2014-07-03 MED ORDER — NALOXONE HCL 0.4 MG/ML IJ SOLN: 0.4000 mg | INTRAMUSCULAR | Status: DC | PRN

## 2014-07-03 MED ORDER — KETOROLAC TROMETHAMINE 30 MG/ML IJ SOLN
30.0000 mg | Freq: Four times a day (QID) | INTRAMUSCULAR | Status: DC
Start: 2014-07-03 — End: 2014-07-03
  Administered 2014-07-03 (×2): 30 mg via INTRAVENOUS
  Filled 2014-07-03 (×2): qty 1

## 2014-07-03 MED ORDER — DEXTROSE-NACL 5-0.45 % IV SOLN
INTRAVENOUS | Status: DC
  Administered 2014-07-03: 12:00:00 via INTRAVENOUS

## 2014-07-03 MED ORDER — ONDANSETRON HCL 4 MG/2ML IJ SOLN
4.0000 mg | Freq: Four times a day (QID) | INTRAMUSCULAR | Status: DC | PRN
Start: 2014-07-03 — End: 2014-07-03
  Administered 2014-07-03: 4 mg via INTRAVENOUS
  Filled 2014-07-03: qty 2

## 2014-07-03 MED ORDER — HYDROMORPHONE 2 MG/ML HIGH CONCENTRATION IV PCA SOLN
INTRAVENOUS | Status: DC
  Administered 2014-07-03: 2 mg via INTRAVENOUS
  Administered 2014-07-03: 13:00:00 via INTRAVENOUS
  Administered 2014-07-03: 8 mg via INTRAVENOUS
  Administered 2014-07-03: 4.5 mg via INTRAVENOUS
  Filled 2014-07-03 (×2): qty 25

## 2014-07-03 MED ORDER — DIPHENHYDRAMINE HCL 25 MG PO CAPS: 25.0000 mg | ORAL_CAPSULE | Freq: Four times a day (QID) | ORAL | Status: DC | PRN

## 2014-07-03 MED ORDER — HEPARIN SOD (PORK) LOCK FLUSH 100 UNIT/ML IV SOLN
500.0000 [IU] | INTRAVENOUS | Status: AC | PRN
  Administered 2014-07-03: 500 [IU]
  Filled 2014-07-03: qty 5

## 2014-07-03 MED ORDER — OXYCODONE HCL 5 MG PO TABS
5.0000 mg | ORAL_TABLET | Freq: Once | ORAL | Status: AC
  Administered 2014-07-03: 5 mg via ORAL
  Filled 2014-07-03: qty 1

## 2014-07-03 MED ORDER — HYDROMORPHONE HCL 2 MG/ML IJ SOLN
1.3000 mg | Freq: Once | INTRAMUSCULAR | Status: AC
  Administered 2014-07-03: 1.3 mg via INTRAVENOUS
  Filled 2014-07-03: qty 1

## 2014-07-03 MED ORDER — SODIUM CHLORIDE 0.9 % IJ SOLN
10.0000 mL | INTRAMUSCULAR | Status: AC | PRN
  Administered 2014-07-03: 10 mL

## 2014-07-03 MED ORDER — HYDROMORPHONE HCL 2 MG/ML IJ SOLN
2.0000 mg | Freq: Once | INTRAMUSCULAR | Status: AC
  Administered 2014-07-03: 2 mg via INTRAVENOUS
  Filled 2014-07-03: qty 1

## 2014-07-03 MED ORDER — POTASSIUM CHLORIDE CRYS ER 20 MEQ PO TBCR: 20.0000 meq | EXTENDED_RELEASE_TABLET | Freq: Every day | ORAL | Status: DC

## 2014-07-03 NOTE — Discharge Instructions (Signed)
Sickle Cell Anemia °Sickle cell anemia is a condition where your red blood cells are shaped like sickles. Red blood cells carry oxygen through the body. Sickle-shaped red blood cells do not live as long as normal red blood cells. They also clump together and block blood from flowing through the blood vessels. These things prevent the body from getting enough oxygen. Sickle cell anemia causes organ damage and pain. It also increases the risk of infection. °HOME CARE °· Drink enough fluid to keep your pee (urine) clear or pale yellow. Drink more in hot weather and during exercise. °· Do not smoke. Smoking lowers oxygen levels in the blood. °· Only take over-the-counter or prescription medicines as told by your doctor. °· Take antibiotic medicines as told by your doctor. Make sure you finish them even if you start to feel better. °· Take supplements as told by your doctor. °· Consider wearing a medical alert bracelet. This tells anyone caring for you in an emergency of your condition. °· When traveling, keep your medical information, doctors' names, and the medicines you take with you at all times. °· If you have a fever, do not take fever medicines right away. This could cover up a problem. Tell your doctor. °·  Keep all follow-up visits with your doctor. Sickle cell anemia requires regular medical care. °GET HELP IF: °You have a fever. °GET HELP RIGHT AWAY IF: °· You feel dizzy or faint. °· You have new belly (abdominal) pain, especially on the left side near the stomach area. °· You have a lasting, often uncomfortable and painful erection of the penis (priapism). If it is not treated right away, you will become unable to have sex (impotence). °· You have numbness in your arms or legs or you have a hard time moving them. °· You have a hard time talking. °· You have a fever or lasting symptoms for more than 2-3 days. °· You have a fever and your symptoms suddenly get worse. °· You have signs or symptoms of infection.  These include: °¨ Chills. °¨ Being more tired than normal (lethargy). °¨ Irritability. °¨ Poor eating. °¨ Throwing up (vomiting). °· You have pain that is not helped with medicine. °· You have shortness of breath. °· You have pain in your chest. °· You are coughing up pus-like or bloody mucus. °· You have a stiff neck. °· Your feet or hands swell or have pain. °· Your belly looks bloated. °· Your joints hurt. °MAKE SURE YOU: °· Understand these instructions. °· Will watch your condition. °· Will get help right away if you are not doing well or get worse. °Document Released: 12/07/2012 Document Revised: 07/03/2013 Document Reviewed: 12/07/2012 °Exit[employer]® Patient Information ©2015 [employer]. This information is not intended to replace advice given to you by your health care provider. Make sure you discuss any questions you have with your health care provider. ° °

## 2014-07-03 NOTE — Telephone Encounter (Signed)
Patient C/O pain to legs, chest and back.  Patient denies radiation down neck arm or jaw.  Rates pain 8/10 on pain scale.  Patient denies N/V/D or fever.  Patient has taken oyxcodone at 5:00a.m, and alleve.  I explained I would notify the physician and give her a call back.  Patient verbalizes understanding.

## 2014-07-03 NOTE — Telephone Encounter (Signed)
Advised patient that I spoke with Dr. Ashley RoyaltyMatthews.  Dr. Ashley RoyaltyMatthews will be calling patient directly.  Patient verbalizes understanding.

## 2014-07-03 NOTE — Discharge Summary (Signed)
Physician Discharge Summary  Brittany Foster ZOX:[id number] DOB: 02/02/1986 DOA: 07/03/2014  PCP: [name],[name], MD  Admit date: 07/03/2014 Discharge date: 07/03/2014  Discharge Diagnoses:  Active Problems:   Hb-SS disease with crisis   Discharge Condition: Good  Disposition: Home  Diet: Regular  Wt Readings from Last 3 Encounters:  06/28/14 113 lb (51.256 kg)  06/11/14 122 lb (55.339 kg)  04/29/14 119 lb 0.8 oz (54 kg)    History of present illness:  This is an opiate naive pt with Hb SS well known to me presents with pain in back and legs of sudden onset which began 2 days ago. Pt also c/o pain in the chest wall which is less than it was last week. She states that she used her oral analgesics as prescribed however she was using them more than usual, as she had tooth pain and was advised by the Dentist Dr. Sharma Covert to use the pills prescribed for her sickle cell pain for the tooth pain (unverified) . Today she rates her pain as 9/10. She describes it as as sharp and sometimes throbbing Provocative factor.is likely the change in weather.. She describes the pain as an intensity of 9/10 and throbbing in nature. The pain is non radiating and she cannot identify any palliative factors. She denies any associated symptoms  Hospital Course:  Pt was initially treated with weight based rapid re-dosing IVF and Toradol . Pain remained above 7/10 and Pt was transitioned to a weight based Dilaudid PCA . Patient's pain decreased to 6/10 even though she did not maximize the use of her PCA. She was given a dose of Oxycodone-APAP 10-325 mg prior to discharge.  Her function was at baseline at the time of discharge. She is discharged home in stable condition. Discussed her medications with her and now she states that she was not examined by the Dentist but the just offered to call something in for her but she declined because of her opiate contract with her. I am concerned about misuse of opioids in this  patient as I feel that she is adjusting the dose upwards. However I am unable to help her if she continues to deny a need.     Discharge Exam:  Filed Vitals:   07/03/14 1801  BP:   Pulse: 75  Temp:   Resp: 14   Filed Vitals:   07/03/14 1455 07/03/14 1553 07/03/14 1650 07/03/14 1801  BP: 116/73 94/56 99/62    Pulse: 93 68 67 75  Temp:  98.4 F (36.9 C)    Resp: SpO2: 93% 97% 98% 100%     General: Alert, awake, oriented x3, in no acute distress.  HEENT: [place]/AT PEERL, EOMI Neck: Trachea midline,  no masses, no thyromegal,y no JVD, no carotid bruit OROPHARYNX:  Moist, No exudate/ erythema/lesions.  Heart: Regular rate and rhythm, without murmurs, rubs, gallops, PMI non-displaced, no heaves or thrills on palpation.  Lungs: Clear to auscultation, no wheezing or rhonchi noted. No increased vocal fremitus resonant to percussion  Abdomen: Soft, nontender, nondistended, positive bowel sounds, no masses no hepatosplenomegaly noted..  Neuro: No focal neurological deficits noted cranial nerves II through XII grossly intact. DTRs 2+ bilaterally upper and lower extremities. Strength at baseline in bilateral upper and lower extremities. Musculoskeletal: No warm swelling or erythema around joints, no spinal tenderness noted. Psychiatric: Patient alert and oriented x3.    Discharge Instructions  Discharge Instructions    Activity as tolerated - No restrictions  Complete by:  As directed      Diet general    Complete by:  As directed             Medication List    TAKE these medications        ALEVE 220 MG Caps  Generic drug:  Naproxen Sodium  Take 660 mg by mouth every 6 (six) hours as needed (pain).     folic acid 1 MG tablet  Commonly known as:  FOLVITE  Take 1 tablet (1 mg total) by mouth daily.     gabapentin 300 MG capsule  Commonly known as:  NEURONTIN  Take 1 capsule (300 mg total) by mouth 3 (three) times daily.     hydroxyurea 500 MG capsule   Commonly known as:  HYDREA  Take 2 capsules (1,000 mg total) by mouth daily. May take with food to minimize GI side effects.     oxyCODONE-acetaminophen 10-325 MG per tablet  Commonly known as:  PERCOCET  Take 1 tablet by mouth every 6 (six) hours as needed for pain.     Topiramate ER 100 MG Cp24  Take 100 mg by mouth at bedtime.          The results of significant diagnostics from this hospitalization (including imaging, microbiology, ancillary and laboratory) are listed below for reference.    Significant Diagnostic Studies: No results found.  Microbiology: No results found for this or any previous visit (from the past 240 hour(s)).   Labs: Basic Metabolic Panel:  Recent Labs Lab 06/28/14 1055 07/03/14 1124  NA 139 141  K 2.9* 3.3*  CL 111 113*  CO2 23 21*  GLUCOSE 121* 87  BUN 7 6  CREATININE 0.39* 0.34*  CALCIUM 8.5 9.0   Liver Function Tests:  Recent Labs Lab 06/28/14 1055  AST 38*  ALT 22  ALKPHOS 93  BILITOT 2.2*  PROT 7.0  ALBUMIN 4.4   No results for input(s): LIPASE, AMYLASE in the last 168 hours. No results for input(s): AMMONIA in the last 168 hours. CBC:  Recent Labs Lab 06/28/14 1055 07/03/14 1124  WBC 11.9* 10.3  NEUTROABS 7.2 5.4  HGB 7.5* 7.9*  HCT 22.1* 23.5*  MCV 98.7 98.3  PLT 347 290   Cardiac Enzymes: No results for input(s): CKTOTAL, CKMB, CKMBINDEX, TROPONINI in the last 168 hours. BNP: Invalid input(s): POCBNP CBG: No results for input(s): GLUCAP in the last 168 hours. Ferritin: No results for input(s): FERRITIN in the last 168 hours.  Time coordinating discharge: 40 minutes.  Signed:  [name],[name]  07/03/2014, 6:18 PM

## 2014-07-03 NOTE — Progress Notes (Signed)
Patient discharged to home, pain rating 7 out of 10. Port de accessed - flushed and hepin lock. Patient received discharge AVS. Reminded of appointment information.

## 2014-07-03 NOTE — H&P (Signed)
[facility] History and Physical  Brittany Foster [id number] DOB: 03/09/1985 DOA: 07/03/2014   PCP: Rece Zechman A., MD   Chief Complaint: Pain in legs and back x 2 days  HPI: This is an opiate naive pt with Hb SS well known to me presents with pain in back and legs of sudden onset which began 2 days ago. Pt also c/o pain in the chest wall which is less than it was last week.  She states that she used her oral analgesics as prescribed however she was using them more than usual, as she had tooth pain and was advised by the Dentist Dr. Sharma CovertNorman to use the pills prescribed for her sickle cell pain for the tooth pain (unverified)  . Today she rates her pain as 9/10. She describes it as as sharp and sometimes throbbing Provocative factor.is likely the change in weather.. She describes the pain as an intensity of 9/10 and throbbing in nature. The pain is non radiating and she cannot identify any palliative factors.  She denies any associated symptoms.      Review of Systems:  Constitutional: No weight loss, night sweats, Fevers, chills, fatigue.  HEENT: No headaches, dizziness, seizures, vision changes, difficulty swallowing,Tooth/dental problems,Sore throat, No sneezing, itching, ear ache, nasal congestion, post nasal drip,  Cardio-vascular: No chest pain, Orthopnea, PND, swelling in lower extremities, anasarca, dizziness, palpitations  GI: No heartburn, indigestion, abdominal pain, nausea, vomiting, diarrhea, change in bowel habits, loss of appetite  Resp: No shortness of breath with exertion or at rest. No excess mucus, no productive cough, No non-productive cough, No coughing up of blood.No change in color of mucus.No wheezing.No chest wall deformity  Skin: no rash or lesions.  GU: no dysuria, change in color of urine, no urgency or frequency. No flank pain.   Psych: No change in mood or affect. No depression or anxiety. No memory loss.    Past Medical History  Diagnosis Date   . Sickle cell anemia    Past Surgical History  Procedure Laterality Date  . Tubal ligation    . Cholecystectomy    . Cesarean section    . Port a cath placement Right     about 6-7 years ago  . Cholecystectomy  2000   Social History:  reports that she has quit smoking. She does not have any smokeless tobacco history on file. She reports that she does not drink alcohol or use illicit drugs.  Allergies  Allergen Reactions  . Ultram [Tramadol] Other (See Comments)    seizures  . Morphine And Related Hives, Rash and Other (See Comments)    shaking    Family History  Problem Relation Age of Onset  . Sickle cell anemia Other   . Sickle cell trait Father   . Sickle cell trait Mother     Prior to Admission medications   Medication Sig Start Date End Date Taking? Authorizing Provider  folic acid (FOLVITE) 1 MG tablet Take 1 tablet (1 mg total) by mouth daily. 02/16/14   Massie MaroonLachina M [name], FNP  gabapentin (NEURONTIN) 300 MG capsule Take 1 capsule (300 mg total) by mouth 3 (three) times daily. 02/14/14   Altha HarmMichelle A Jung Yurchak, MD  hydroxyurea (HYDREA) 500 MG capsule Take 2 capsules (1,000 mg total) by mouth daily. May take with food to minimize GI side effects. 06/26/14   Altha HarmMichelle A Brandonlee Navis, MD  Naproxen Sodium (ALEVE) 220 MG CAPS Take 660 mg by mouth every 6 (six) hours as needed (pain).  Historical Provider, MD  oxyCODONE-acetaminophen (PERCOCET) 10-325 MG per tablet Take 1 tablet by mouth every 6 (six) hours as needed for pain. 06/11/14   Altha Harm, MD  Topiramate ER 100 MG CP24 Take 100 mg by mouth at bedtime.    Historical Provider, MD   Physical Exam: Filed Vitals:   07/03/14 1055  BP: 106/63  Pulse: 71  Temp: 98.3 F (36.8 C)  Resp: 17  SpO2: 100%   General: Alert, awake, oriented x3, in no acute distress.  HEENT: [place]/AT PEERL, EOMI, anicteric. Neck: Trachea midline,  no masses, no thyromegal,y no JVD, no carotid bruit OROPHARYNX:  Moist, No exudate/  erythema/lesions.  Heart: Regular rate and rhythm, without murmurs, rubs, gallops, PMI non-displaced, no heaves or thrills on palpation.  Lungs: Clear to auscultation, no wheezing or rhonchi noted. No increased vocal fremitus resonant to percussion  Abdomen: Soft, nontender, nondistended, positive bowel sounds, no masses no hepatosplenomegaly noted..  Neuro: No focal neurological deficits noted cranial nerves II through XII grossly intact.   Labs on Admission:   Basic Metabolic Panel:  Recent Labs Lab 06/28/14 1055  NA 139  K 2.9*  CL 111  CO2 23  GLUCOSE 121*  BUN 7  CREATININE 0.39*  CALCIUM 8.5   Liver Function Tests:  Recent Labs Lab 06/28/14 1055  AST 38*  ALT 22  ALKPHOS 93  BILITOT 2.2*  PROT 7.0  ALBUMIN 4.4   CBC:  Recent Labs Lab 06/28/14 1055  WBC 11.9*  NEUTROABS 7.2  HGB 7.5*  HCT 22.1*  MCV 98.7  PLT 347      Assessment/Plan: Active Problems: Hb SS with Crisis: Pt will be treated with initially treated with weight based rapid re-dosing IVF and Toradol . If pain is above 7/10 then Pt will be transitioned to a weight based Dilaudid PCA . Patient will be re-evaluated for pain in the context of function and relationship to baseline as care progresses.   Time spend: 35 minutes Code Status: Full Code Family Communication: N/A Disposition Plan: Anticipate discharge home  Shaunak Kreis A., MD  Pager [phone]  If 7PM-7AM, please contact night-coverage [email or web address] Password [email or web address] 07/03/2014, 11:00 AM

## 2014-07-05 ENCOUNTER — Ambulatory Visit (INDEPENDENT_AMBULATORY_CARE_PROVIDER_SITE_OTHER): Payer: Medicaid Other | Admitting: Internal Medicine

## 2014-07-05 VITALS — BP 115/67 | HR 93 | Temp 98.7°F | Resp 16 | Ht 60.0 in | Wt 117.0 lb

## 2014-07-05 DIAGNOSIS — Z79899 Other long term (current) drug therapy: Secondary | ICD-10-CM | POA: Diagnosis not present

## 2014-07-05 DIAGNOSIS — D571 Sickle-cell disease without crisis: Secondary | ICD-10-CM

## 2014-07-05 MED ORDER — OXYCODONE-ACETAMINOPHEN 10-325 MG PO TABS: 1.0000 | ORAL_TABLET | Freq: Four times a day (QID) | ORAL | Status: DC | PRN

## 2014-07-12 ENCOUNTER — Non-Acute Institutional Stay (HOSPITAL_COMMUNITY)
Admission: AD | Admit: 2014-07-12 | Discharge: 2014-07-12 | Disposition: A | Discharge status: Home / Self Care | Payer: Medicaid Other | Attending: Internal Medicine | Admitting: Internal Medicine

## 2014-07-12 ENCOUNTER — Telehealth (HOSPITAL_COMMUNITY): Payer: Self-pay | Admitting: Hematology

## 2014-07-12 ENCOUNTER — Encounter (HOSPITAL_COMMUNITY): Payer: Self-pay | Admitting: *Deleted

## 2014-07-12 DIAGNOSIS — E860 Dehydration: Secondary | ICD-10-CM | POA: Insufficient documentation

## 2014-07-12 DIAGNOSIS — D5700 Hb-SS disease with crisis, unspecified: Secondary | ICD-10-CM | POA: Diagnosis not present

## 2014-07-12 DIAGNOSIS — Z79899 Other long term (current) drug therapy: Secondary | ICD-10-CM | POA: Diagnosis not present

## 2014-07-12 DIAGNOSIS — Z87891 Personal history of nicotine dependence: Secondary | ICD-10-CM | POA: Diagnosis not present

## 2014-07-12 DIAGNOSIS — Z791 Long term (current) use of non-steroidal anti-inflammatories (NSAID): Secondary | ICD-10-CM | POA: Insufficient documentation

## 2014-07-12 DIAGNOSIS — G8929 Other chronic pain: Secondary | ICD-10-CM | POA: Insufficient documentation

## 2014-07-12 DIAGNOSIS — Z79891 Long term (current) use of opiate analgesic: Secondary | ICD-10-CM | POA: Diagnosis not present

## 2014-07-12 DIAGNOSIS — R52 Pain, unspecified: Secondary | ICD-10-CM | POA: Diagnosis present

## 2014-07-12 LAB — COMPREHENSIVE METABOLIC PANEL
ALT: 29 U/L (ref 14–54)
ANION GAP: 6 (ref 5–15)
AST: 45 U/L — ABNORMAL HIGH (ref 15–41)
Albumin: 4.2 g/dL (ref 3.5–5.0)
Alkaline Phosphatase: 101 U/L (ref 38–126)
BILIRUBIN TOTAL: 3.1 mg/dL — AB (ref 0.3–1.2)
CO2: 22 mmol/L (ref 22–32)
CREATININE: 0.32 mg/dL — AB (ref 0.44–1.00)
Calcium: 9.2 mg/dL (ref 8.9–10.3)
Chloride: 111 mmol/L (ref 101–111)
GFR calc Af Amer: >60 mL/min (ref >60)
GFR calc non Af Amer: >60 mL/min (ref >60)
Glucose, Bld: 101 mg/dL — ABNORMAL HIGH (ref 65–99)
Potassium: 3.6 mmol/L (ref 3.5–5.1)
Sodium: 139 mmol/L (ref 135–145)
TOTAL PROTEIN: 7.7 g/dL (ref 6.5–8.1)

## 2014-07-12 LAB — CBC WITH DIFFERENTIAL/PLATELET
Basophils Absolute: 0 10*3/uL (ref 0.0–0.1)
Basophils Relative: 0 % (ref 0–1)
EOS PCT: 0 % (ref 0–5)
Eosinophils Absolute: 0 10*3/uL (ref 0.0–0.7)
HCT: 23.4 % — ABNORMAL LOW (ref 36.0–46.0)
Hemoglobin: 8.2 g/dL — ABNORMAL LOW (ref 12.0–15.0)
Lymphocytes Relative: 24 % (ref 12–46)
Lymphs Abs: 2.4 10*3/uL (ref 0.7–4.0)
MCH: 34.5 pg — AB (ref 26.0–34.0)
MCHC: 35 g/dL (ref 30.0–36.0)
MCV: 98.3 fL (ref 78.0–100.0)
MONOS PCT: 8 % (ref 3–12)
Monocytes Absolute: 0.8 10*3/uL (ref 0.1–1.0)
NEUTROS PCT: 68 % (ref 43–77)
Neutro Abs: 6.7 10*3/uL (ref 1.7–7.7)
Platelets: 305 10*3/uL (ref 150–400)
RBC: 2.38 MIL/uL — AB (ref 3.87–5.11)
RDW: 23.3 % — ABNORMAL HIGH (ref 11.5–15.5)
WBC: 9.9 10*3/uL (ref 4.0–10.5)
nRBC: 4 /100 WBC — ABNORMAL HIGH

## 2014-07-12 LAB — LACTATE DEHYDROGENASE: LDH: 309 U/L — ABNORMAL HIGH (ref 98–192)

## 2014-07-12 LAB — RETICULOCYTES: RBC.: 2.38 MIL/uL — AB (ref 3.87–5.11)

## 2014-07-12 MED ORDER — OXYCODONE HCL 5 MG PO TABS
5.0000 mg | ORAL_TABLET | Freq: Once | ORAL | Status: AC
  Administered 2014-07-12: 5 mg via ORAL
  Filled 2014-07-12: qty 1

## 2014-07-12 MED ORDER — DEXTROSE-NACL 5-0.45 % IV SOLN
INTRAVENOUS | Status: DC
  Administered 2014-07-12: 12:00:00 via INTRAVENOUS

## 2014-07-12 MED ORDER — ONDANSETRON HCL 4 MG/2ML IJ SOLN: 4.0000 mg | Freq: Four times a day (QID) | INTRAMUSCULAR | Status: DC | PRN

## 2014-07-12 MED ORDER — FOLIC ACID 1 MG PO TABS
1.0000 mg | ORAL_TABLET | Freq: Every day | ORAL | Status: DC
  Filled 2014-07-12: qty 1

## 2014-07-12 MED ORDER — KETOROLAC TROMETHAMINE 30 MG/ML IJ SOLN
30.0000 mg | Freq: Four times a day (QID) | INTRAMUSCULAR | Status: DC
  Administered 2014-07-12: 30 mg via INTRAVENOUS
  Filled 2014-07-12: qty 1

## 2014-07-12 MED ORDER — OXYCODONE-ACETAMINOPHEN 5-325 MG PO TABS
1.0000 | ORAL_TABLET | Freq: Once | ORAL | Status: AC
  Administered 2014-07-12: 1 via ORAL
  Filled 2014-07-12: qty 1

## 2014-07-12 MED ORDER — SODIUM CHLORIDE 0.9 % IV SOLN
12.5000 mg | Freq: Four times a day (QID) | INTRAVENOUS | Status: DC | PRN
  Filled 2014-07-12: qty 0.25

## 2014-07-12 MED ORDER — NALOXONE HCL 0.4 MG/ML IJ SOLN: 0.4000 mg | INTRAMUSCULAR | Status: DC | PRN

## 2014-07-12 MED ORDER — SENNOSIDES-DOCUSATE SODIUM 8.6-50 MG PO TABS: 1.0000 | ORAL_TABLET | Freq: Two times a day (BID) | ORAL | Status: DC

## 2014-07-12 MED ORDER — DIPHENHYDRAMINE HCL 12.5 MG/5ML PO ELIX: 12.5000 mg | ORAL_SOLUTION | Freq: Four times a day (QID) | ORAL | Status: DC | PRN

## 2014-07-12 MED ORDER — POLYETHYLENE GLYCOL 3350 17 G PO PACK: 17.0000 g | PACK | Freq: Every day | ORAL | Status: DC | PRN

## 2014-07-12 MED ORDER — SODIUM CHLORIDE 0.9 % IJ SOLN: 9.0000 mL | INTRAMUSCULAR | Status: DC | PRN

## 2014-07-12 MED ORDER — SODIUM CHLORIDE 0.9 % IJ SOLN
10.0000 mL | INTRAMUSCULAR | Status: AC | PRN
  Administered 2014-07-12: 10 mL

## 2014-07-12 MED ORDER — HEPARIN SOD (PORK) LOCK FLUSH 100 UNIT/ML IV SOLN
500.0000 [IU] | INTRAVENOUS | Status: AC | PRN
  Administered 2014-07-12: 500 [IU]
  Filled 2014-07-12: qty 5

## 2014-07-12 MED ORDER — HYDROMORPHONE 2 MG/ML HIGH CONCENTRATION IV PCA SOLN
INTRAVENOUS | Status: DC
  Administered 2014-07-12: 12:00:00 via INTRAVENOUS
  Administered 2014-07-12: 12 mg via INTRAVENOUS
  Administered 2014-07-12: 2 mg via INTRAVENOUS
  Filled 2014-07-12: qty 25

## 2014-07-12 NOTE — Telephone Encounter (Signed)
Patient C/O pain to legs and back that is 8/10 on pain scale.  Patient denies fever.  C/O mild chest pain with some shortness of breath, C/O nausea but not emesis.  Denies diarrhea and abdominal pain.  I explained I would notify the physician and give her a call back.  Patient verbalizes understanding.

## 2014-07-12 NOTE — Discharge Summary (Signed)
Physician Discharge Summary  Patient ID: Brittany Foster MRN: [id number] DOB/AGE: 30/03/1985 30 y.o.  Admit date: 07/12/2014 Discharge date: 07/12/2014  Admission Diagnoses:  Discharge Diagnoses:  Active Problems:   Hb-SS disease with crisis   Sickle cell disease with crisis   Discharged Condition: good  Hospital Course: Patient admitted with sickle cell painful crisis. Treated with IV Dilaudid PCA, Toradol and IVF. Has been better with pain down from 10/10 to 5/10. Patient has chronic pain and is using the day hospital for her acute pain. She's been advised to follow-up with her primary care physician and will require going to a conventional of pain clinic for pain management.  Consults: None  Significant Diagnostic Studies: labs: CBC and CMP done within normal limits  Treatments: IV hydration and analgesia: Dilaudid  Discharge Exam: Blood pressure 107/62, pulse 85, temperature 98.7 F (37.1 C), temperature source Oral, resp. rate 18, height 5' (1.524 m), weight 53.071 kg (117 lb), last menstrual period 06/18/2014, SpO2 98 %. General appearance: alert, cooperative and no distress Head: Normocephalic, without obvious abnormality, atraumatic Neck: no adenopathy, no carotid bruit, no JVD, supple, symmetrical, trachea midline and thyroid not enlarged, symmetric, no tenderness/mass/nodules Back: symmetric, no curvature. ROM normal. No CVA tenderness. Resp: clear to auscultation bilaterally Chest wall: no tenderness GI: soft, non-tender; bowel sounds normal; no masses,  no organomegaly Extremities: extremities normal, atraumatic, no cyanosis or edema Pulses: 2+ and symmetric Skin: Skin color, texture, turgor normal. No rashes or lesions Neurologic: Grossly normal  Disposition: 01-Home or Self Care     Medication List    TAKE these medications        ALEVE 220 MG Caps  Generic drug:  Naproxen Sodium  Take 660 mg by mouth every 6 (six) hours as needed (pain).     folic acid 1  MG tablet  Commonly known as:  FOLVITE  Take 1 tablet (1 mg total) by mouth daily.     gabapentin 300 MG capsule  Commonly known as:  NEURONTIN  Take 1 capsule (300 mg total) by mouth 3 (three) times daily.     hydroxyurea 500 MG capsule  Commonly known as:  HYDREA  Take 2 capsules (1,000 mg total) by mouth daily. May take with food to minimize GI side effects.     oxyCODONE-acetaminophen 10-325 MG per tablet  Commonly known as:  PERCOCET  Take 1 tablet by mouth every 6 (six) hours as needed for pain.     potassium chloride SA 20 MEQ tablet  Commonly known as:  K-DUR,KLOR-CON  Take 1 tablet (20 mEq total) by mouth daily.     Topiramate ER 100 MG Cp24  Take 100 mg by mouth at bedtime.         SignedLonia Blood: GARBA,LAWAL 07/12/2014, 5:05 PM

## 2014-07-12 NOTE — Discharge Instructions (Signed)
Sickle Cell Anemia °Sickle cell anemia is a condition where your red blood cells are shaped like sickles. Red blood cells carry oxygen through the body. Sickle-shaped red blood cells do not live as long as normal red blood cells. They also clump together and block blood from flowing through the blood vessels. These things prevent the body from getting enough oxygen. Sickle cell anemia causes organ damage and pain. It also increases the risk of infection. °HOME CARE °· Drink enough fluid to keep your pee (urine) clear or pale yellow. Drink more in hot weather and during exercise. °· Do not smoke. Smoking lowers oxygen levels in the blood. °· Only take over-the-counter or prescription medicines as told by your doctor. °· Take antibiotic medicines as told by your doctor. Make sure you finish them even if you start to feel better. °· Take supplements as told by your doctor. °· Consider wearing a medical alert bracelet. This tells anyone caring for you in an emergency of your condition. °· When traveling, keep your medical information, doctors' names, and the medicines you take with you at all times. °· If you have a fever, do not take fever medicines right away. This could cover up a problem. Tell your doctor. °·  Keep all follow-up visits with your doctor. Sickle cell anemia requires regular medical care. °GET HELP IF: °You have a fever. °GET HELP RIGHT AWAY IF: °· You feel dizzy or faint. °· You have new belly (abdominal) pain, especially on the left side near the stomach area. °· You have a lasting, often uncomfortable and painful erection of the penis (priapism). If it is not treated right away, you will become unable to have sex (impotence). °· You have numbness in your arms or legs or you have a hard time moving them. °· You have a hard time talking. °· You have a fever or lasting symptoms for more than 2-3 days. °· You have a fever and your symptoms suddenly get worse. °· You have signs or symptoms of infection.  These include: °¨ Chills. °¨ Being more tired than normal (lethargy). °¨ Irritability. °¨ Poor eating. °¨ Throwing up (vomiting). °· You have pain that is not helped with medicine. °· You have shortness of breath. °· You have pain in your chest. °· You are coughing up pus-like or bloody mucus. °· You have a stiff neck. °· Your feet or hands swell or have pain. °· Your belly looks bloated. °· Your joints hurt. °MAKE SURE YOU: °· Understand these instructions. °· Will watch your condition. °· Will get help right away if you are not doing well or get worse. °Document Released: 12/07/2012 Document Revised: 07/03/2013 Document Reviewed: 12/07/2012 °Exit[employer]® Patient Information ©2015 [employer]. This information is not intended to replace advice given to you by your health care provider. Make sure you discuss any questions you have with your health care provider. ° °

## 2014-07-12 NOTE — H&P (Signed)
Brittany CampbellMiranda Foster is an 29 y.o. female.   Chief Complaint: Generalized pain HPI: patient is a 29 yo with history of sickle cell disease who has had recurrent visit to the ER and Day Hospital with painful crisis here again with pain in her back and legs. Pain described as burning, deep, consistent with her sickle cell pain. No fever, no SOB, no NVD. Has taken her home medications.   Past Medical History  Diagnosis Date  . Sickle cell anemia     Past Surgical History  Procedure Laterality Date  . Tubal ligation    . Cholecystectomy    . Cesarean section    . Port a cath placement Right     about 6-7 years ago  . Cholecystectomy  2000    Family History  Problem Relation Age of Onset  . Sickle cell anemia Other   . Sickle cell trait Father   . Sickle cell trait Mother    Social History:  reports that she has quit smoking. She does not have any smokeless tobacco history on file. She reports that she does not drink alcohol or use illicit drugs.  Allergies:  Allergies  Allergen Reactions  . Ultram [Tramadol] Other (See Comments)    seizures  . Morphine And Related Hives, Rash and Other (See Comments)    shaking    Medications Prior to Admission  Medication Sig Dispense Refill  . folic acid (FOLVITE) 1 MG tablet Take 1 tablet (1 mg total) by mouth daily. 30 tablet 11  . gabapentin (NEURONTIN) 300 MG capsule Take 1 capsule (300 mg total) by mouth 3 (three) times daily. 90 capsule 2  . hydroxyurea (HYDREA) 500 MG capsule Take 2 capsules (1,000 mg total) by mouth daily. May take with food to minimize GI side effects. 60 capsule 3  . Naproxen Sodium (ALEVE) 220 MG CAPS Take 660 mg by mouth every 6 (six) hours as needed (pain).    Marland Kitchen. oxyCODONE-acetaminophen (PERCOCET) 10-325 MG per tablet Take 1 tablet by mouth every 6 (six) hours as needed for pain. 100 tablet 0  . Topiramate ER 100 MG CP24 Take 100 mg by mouth at bedtime.    . potassium chloride SA (K-DUR,KLOR-CON) 20 MEQ tablet Take 1  tablet (20 mEq total) by mouth daily. (Patient not taking: Reported on 07/05/2014) 30 tablet 1    No results found for this or any previous visit (from the past 48 hour(s)). No results found.  Review of Systems  Constitutional: Negative.   Eyes: Negative.   Respiratory: Negative.   Cardiovascular: Negative.   Genitourinary: Negative.   Musculoskeletal: Positive for myalgias, back pain, joint pain and neck pain.  Skin: Negative.   Neurological: Negative.   Endo/Heme/Allergies: Negative.   Psychiatric/Behavioral: Negative.   All other systems reviewed and are negative.   Blood pressure 118/71, pulse 89, temperature 98.7 F (37.1 C), temperature source Oral, resp. rate 18, height 5' (1.524 m), weight 53.071 kg (117 lb), last menstrual period 06/18/2014, SpO2 99 %. Physical Exam  Constitutional: She is oriented to person, place, and time. She appears well-developed and well-nourished.  HENT:  Head: Normocephalic and atraumatic.  Right Ear: External ear normal.  Left Ear: External ear normal.  Mouth/Throat: Oropharynx is clear and moist.  Eyes: Conjunctivae and EOM are normal. Pupils are equal, round, and reactive to light.  Neck: Normal range of motion. Neck supple.  Cardiovascular: Normal rate, regular rhythm, normal heart sounds and intact distal pulses.   Respiratory: Effort normal and  breath sounds normal.  GI: Soft. Bowel sounds are normal.  Musculoskeletal: Normal range of motion. She exhibits no edema or tenderness.  Neurological: She is alert and oriented to person, place, and time. She has normal reflexes.  Skin: Skin is warm and dry.  Psychiatric: She has a normal mood and affect.     Assessment/Plan A 29 yo admitted with sickle cell painful crisis.  #1. Sickle Cell Painful crisis: Patient will be admitted and placed on IV Dilaudid PCA, Toradol, IVF.  #2. Sickle Cell Anemia: H/H will be checked.  #3 Dehydration: Will hydrate with D51/2 NS   GARBA,LAWAL 07/12/2014,  11:52 AM

## 2014-07-12 NOTE — Progress Notes (Signed)
Spoke with Dr. Mikeal Hawthornegarba about patient's pain.  Patient states that no is helping her pain.  Advised that she has used 7.5mg  from PCA.  MD gave orders for home pain medication. RN will continue to monitor.

## 2014-07-12 NOTE — Progress Notes (Signed)
Patient ID: Brittany Foster, female   DOB: 05/21/1985, 29 y.o.   MRN: [id number] Discharge instructions given to patient.  Port flushed and de-accessed per protocol.  Patient gathered belongings.  Patient states pain is improved to 6/10 on pain scale.  Patient states that her transportation is on the way.

## 2014-07-12 NOTE — Telephone Encounter (Signed)
Spoke with Dr. Mikeal HawthorneGarba.  It is ok for her to come to the day hospital.  Patient will have to get a ride from a transportation service, so she is not sure how long it will take to get her.  I called the transportation # [phone] and they advised that they would not provide transportation for patient today.  I was advised that the patient was aware and they would not discuss it with me.  I called patient back and told her of the conversation.  [facility] receive adequate treatment.  Patient verbalizes understanding.

## 2014-07-16 ENCOUNTER — Telehealth (HOSPITAL_COMMUNITY): Payer: Self-pay | Admitting: Hematology

## 2014-07-20 ENCOUNTER — Ambulatory Visit: Payer: Medicaid Other | Admitting: Family Medicine

## 2014-07-20 ENCOUNTER — Encounter: Payer: Medicaid Other | Admitting: Family Medicine

## 2014-07-20 ENCOUNTER — Telehealth: Payer: Self-pay | Admitting: Hematology

## 2014-07-20 ENCOUNTER — Telehealth (HOSPITAL_COMMUNITY): Payer: Self-pay | Admitting: *Deleted

## 2014-07-20 NOTE — Telephone Encounter (Signed)
Pt called with pain in her legs, back and hips, pt reported that her home medicines were taken with no relief. Pt complains of nausea, denies CP, vomiting, diarrhea, fever. Pt had an office appointment scheduled today at 1100. Dr.[name] paged. Pt verbalizes understanding that she will be called back as soon as the MD will be notified. Kjirsten Bloodgood, [name],RN

## 2014-07-20 NOTE — Telephone Encounter (Signed)
Returned called to patient.  Advised patient that per Dr. Ashley RoyaltyMatthews, she needs to keep her office appointment at 11:00, and she will be evaluated to see if she needs to come to the day hospital.  Patient states she may not have a ride to get to her appointment.  I explained that the day hospital was located at the same place.  Patient then proceeded to ask if she could "just come to the day hospital or not."  I re-explained that she needed to be seen in the primary office and if it was deemed necessary after her visit she would be seen at the day hospital.  Patient said she was unsure if she would have a ride or not, but would call back to let us know.  Patient placed on the office schedule for 11:00.

## 2014-08-07 ENCOUNTER — Ambulatory Visit (INDEPENDENT_AMBULATORY_CARE_PROVIDER_SITE_OTHER): Payer: Medicaid Other | Admitting: Family Medicine

## 2014-08-07 VITALS — BP 119/71 | HR 101 | Temp 98.3°F | Resp 16 | Ht 60.0 in | Wt 115.0 lb

## 2014-08-07 DIAGNOSIS — Z79899 Other long term (current) drug therapy: Secondary | ICD-10-CM

## 2014-08-07 DIAGNOSIS — E559 Vitamin D deficiency, unspecified: Secondary | ICD-10-CM

## 2014-08-07 DIAGNOSIS — D571 Sickle-cell disease without crisis: Secondary | ICD-10-CM | POA: Diagnosis not present

## 2014-08-07 DIAGNOSIS — F329 Major depressive disorder, single episode, unspecified: Secondary | ICD-10-CM

## 2014-08-07 DIAGNOSIS — Z23 Encounter for immunization: Secondary | ICD-10-CM

## 2014-08-07 DIAGNOSIS — F32A Depression, unspecified: Secondary | ICD-10-CM

## 2014-08-07 DIAGNOSIS — Z79891 Long term (current) use of opiate analgesic: Secondary | ICD-10-CM

## 2014-08-07 LAB — POCT URINALYSIS DIP (DEVICE)
Bilirubin Urine: NEGATIVE
Glucose, UA: NEGATIVE mg/dL
HGB URINE DIPSTICK: NEGATIVE
Ketones, ur: NEGATIVE mg/dL
LEUKOCYTES UA: NEGATIVE
Nitrite: NEGATIVE
PH: 7 (ref 5.0–8.0)
Protein, ur: NEGATIVE mg/dL
Specific Gravity, Urine: 1.015 (ref 1.005–1.030)
Urobilinogen, UA: 2 mg/dL — ABNORMAL HIGH (ref 0.0–1.0)

## 2014-08-07 MED ORDER — VITAMIN D3 125 MCG (5000 UT) PO CAPS: 1.0000 | ORAL_CAPSULE | Freq: Every day | ORAL | Status: DC

## 2014-08-07 MED ORDER — HYDROXYUREA 500 MG PO CAPS: 1000.0000 mg | ORAL_CAPSULE | Freq: Every day | ORAL | Status: DC

## 2014-08-07 MED ORDER — OXYCODONE-ACETAMINOPHEN 10-325 MG PO TABS: 1.0000 | ORAL_TABLET | Freq: Four times a day (QID) | ORAL | Status: DC | PRN

## 2014-08-07 NOTE — Patient Instructions (Signed)
Chronic Pain Chronic pain can be defined as pain that is off and on and lasts for 3-6 months or longer. Many things cause chronic pain, which can make it difficult to make a diagnosis. There are many treatment options available for chronic pain. However, finding a treatment that works well for you may require trying various approaches until the right one is found. Many people benefit from a combination of two or more types of treatment to control their pain. SYMPTOMS  Chronic pain can occur anywhere in the body and can range from mild to very severe. Some types of chronic pain include:  Headache.  Low back pain.  Cancer pain.  Arthritis pain.  Neurogenic pain. This is pain resulting from damage to nerves. People with chronic pain may also have other symptoms such as:  Depression.  Anger.  Insomnia.  Anxiety. DIAGNOSIS  Your health care provider will help diagnose your condition over time. In many cases, the initial focus will be on excluding possible conditions that could be causing the pain. Depending on your symptoms, your health care provider may order tests to diagnose your condition. Some of these tests may include:   Blood tests.   CT scan.   MRI.   X-rays.   Ultrasounds.   Nerve conduction studies.  You may need to see a specialist.  TREATMENT  Finding treatment that works well may take time. You may be referred to a pain specialist. He or she may prescribe medicine or therapies, such as:   Mindful meditation or yoga.  Shots (injections) of numbing or pain-relieving medicines into the spine or area of pain.  Local electrical stimulation.  Acupuncture.   Massage therapy.   Aroma, color, light, or sound therapy.   Biofeedback.   Working with a physical therapist to keep from getting stiff.   Regular, gentle exercise.   Cognitive or behavioral therapy.   Group support.  Sometimes, surgery may be recommended.  HOME CARE INSTRUCTIONS    Take all medicines as directed by your health care provider.   Lessen stress in your life by relaxing and doing things such as listening to calming music.   Exercise or be active as directed by your health care provider.   Eat a healthy diet and include things such as vegetables, fruits, fish, and lean meats in your diet.   Keep all follow-up appointments with your health care provider.   Attend a support group with others suffering from chronic pain. SEEK MEDICAL CARE IF:   Your pain gets worse.   You develop a new pain that was not there before.   You cannot tolerate medicines given to you by your health care provider.   You have new symptoms since your last visit with your health care provider.  SEEK IMMEDIATE MEDICAL CARE IF:   You feel weak.   You have decreased sensation or numbness.   You lose control of bowel or bladder function.   Your pain suddenly gets much worse.   You develop shaking.  You develop chills.  You develop confusion.  You develop chest pain.  You develop shortness of breath.  MAKE SURE YOU:  Understand these instructions.  Will watch your condition.  Will get help right away if you are not doing well or get worse. Document Released: 11/08/2001 Document Revised: 10/19/2012 Document Reviewed: 08/12/2012 [facility] Patient Information 2015 [place], [place]. This information is not intended to replace advice given to you by your health care provider. Make sure you discuss any  questions you have with your health care provider. Sickle Cell Anemia, Adult Sickle cell anemia is a condition in which red blood cells have an abnormal "sickle" shape. This abnormal shape shortens the cells' life span, which results in a lower than normal concentration of red blood cells in the blood. The sickle shape also causes the cells to clump together and block free blood flow through the blood vessels. As a result, the tissues and organs of the body do  not receive enough oxygen. Sickle cell anemia causes organ damage and pain and increases the risk of infection. CAUSES  Sickle cell anemia is a genetic disorder. Those who receive two copies of the gene have the condition, and those who receive one copy have the trait. RISK FACTORS The sickle cell gene is most common in people whose families originated in [place]. Other areas of the globe where sickle cell trait occurs include the [place], [place], and the [place].  SIGNS AND SYMPTOMS  Pain, especially in the extremities, back, chest, or abdomen (common). The pain may start suddenly or may develop following an illness, especially if there is dehydration. Pain can also occur due to overexertion or exposure to extreme temperature changes.  Frequent severe bacterial infections, especially certain types of pneumonia and meningitis.  Pain and swelling in the hands and feet.  Decreased activity.   Loss of appetite.   Change in behavior.  Headaches.  Seizures.  Shortness of breath or difficulty breathing.  Vision changes.  Skin ulcers. Those with the trait may not have symptoms or they may have mild symptoms.  DIAGNOSIS  Sickle cell anemia is diagnosed with blood tests that demonstrate the genetic trait. It is often diagnosed during the newborn period, due to mandatory testing nationwide. A variety of blood tests, X-rays, CT scans, MRI scans, ultrasounds, and lung function tests may also be done to monitor the condition. TREATMENT  Sickle cell anemia may be treated with:  Medicines. You may be given pain medicines, antibiotic medicines (to treat and prevent infections) or medicines to increase the production of certain types of hemoglobin.  Fluids.  Oxygen.  Blood transfusions. HOME CARE INSTRUCTIONS   Drink enough fluid to keep your urine clear or pale yellow. Increase your fluid intake in hot weather and during exercise.  Do not  smoke. Smoking lowers oxygen levels in the blood.   Only take over-the-counter or prescription medicines for pain, fever, or discomfort as directed by your health care provider.  Take antibiotics as directed by your health care provider. Make sure you finish them it even if you start to feel better.   Take supplements as directed by your health care provider.   Consider wearing a medical alert bracelet. This tells anyone caring for you in an emergency of your condition.   When traveling, keep your medical information, health care provider's names, and the medicines you take with you at all times.   If you develop a fever, do not take medicines to reduce the fever right away. This could cover up a problem that is developing. Notify your health care provider.  Keep all follow-up appointments with your health care provider. Sickle cell anemia requires regular medical care. SEEK MEDICAL CARE IF: You have a fever. SEEK IMMEDIATE MEDICAL CARE IF:   You feel dizzy or faint.   You have new abdominal pain, especially on the left side near the stomach area.   You develop a persistent, often uncomfortable and painful penile  erection (priapism). If this is not treated immediately it will lead to impotence.   You have numbness your arms or legs or you have a hard time moving them.   You have a hard time with speech.   You have a fever or persistent symptoms for more than 2-3 days.   You have a fever and your symptoms suddenly get worse.   You have signs or symptoms of infection. These include:   Chills.   Abnormal tiredness (lethargy).   Irritability.   Poor eating.   Vomiting.   You develop pain that is not helped with medicine.   You develop shortness of breath.  You have pain in your chest.   You are coughing up pus-like or bloody sputum.   You develop a stiff neck.  Your feet or hands swell or have pain.  Your abdomen appears bloated.  You  develop joint pain. MAKE SURE YOU:  Understand these instructions. Document Released: 05/27/2005 Document Revised: 07/03/2013 Document Reviewed: 09/28/2012 [facility] Patient Information 2015 [place], [place]. This information is not intended to replace advice given to you by your health care provider. Make sure you discuss any questions you have with your health care provider. Vitamin D Deficiency Vitamin D is an important vitamin that your body needs. Having too little of it in your body is called a deficiency. A very bad deficiency can make your bones soft and can cause a condition called rickets.  Vitamin D is important to your body for different reasons, such as:   It helps your body absorb 2 minerals called calcium and phosphorus.  It helps make your bones healthy.  It may prevent some diseases, such as diabetes and multiple sclerosis.  It helps your muscles and heart. You can get vitamin D in several ways. It is a natural part of some foods. The vitamin is also added to some dairy products and cereals. Some people take vitamin D supplements. Also, your body makes vitamin D when you are in the sun. It changes the sun's rays into a form of the vitamin that your body can use. CAUSES   Not eating enough foods that contain vitamin D.  Not getting enough sunlight.  Having certain digestive system diseases that make it hard to absorb vitamin D. These diseases include Crohn's disease, chronic pancreatitis, and cystic fibrosis.  Having a surgery in which part of the stomach or small intestine is removed.  Being obese. Fat cells pull vitamin D out of your blood. That means that obese people may not have enough vitamin D left in their blood and in other body tissues.  Having chronic kidney or liver disease. RISK FACTORS Risk factors are things that make you more likely to develop a vitamin D deficiency. They include:  Being older.  Not being able to get outside very much.  Living in a  nursing home.  Having had broken bones.  Having weak or thin bones (osteoporosis).  Having a disease or condition that changes how your body absorbs vitamin D.  Having dark skin.  Some medicines such as seizure medicines or steroids.  Being overweight or obese. SYMPTOMS Mild cases of vitamin D deficiency may not have any symptoms. If you have a very bad case, symptoms may include:  Bone pain.  Muscle pain.  Falling often.  Broken bones caused by a minor injury, due to osteoporosis. DIAGNOSIS A blood test is the best way to tell if you have a vitamin D deficiency. TREATMENT Vitamin D deficiency  can be treated in different ways. Treatment for vitamin D deficiency depends on what is causing it. Options include:  Taking vitamin D supplements.  Taking a calcium supplement. Your caregiver will suggest what dose is best for you. HOME CARE INSTRUCTIONS  Take any supplements that your caregiver prescribes. Follow the directions carefully. Take only the suggested amount.  Have your blood tested 2 months after you start taking supplements.  Eat foods that contain vitamin D. Healthy choices include:  Fortified dairy products, cereals, or juices. Fortified means vitamin D has been added to the food. Check the label on the package to be sure.  Fatty fish like salmon or trout.  Eggs.  Oysters.  Do not use a tanning bed.  Keep your weight at a healthy level. Lose weight if you need to.  Keep all follow-up appointments. Your caregiver will need to perform blood tests to make sure your vitamin D deficiency is going away. SEEK MEDICAL CARE IF:  You have any questions about your treatment.  You continue to have symptoms of vitamin D deficiency.  You have nausea or vomiting.  You are constipated.  You feel confused.  You have severe abdominal or back pain. MAKE SURE YOU:  Understand these instructions.  Will watch your condition.  Will get help right away if you are  not doing well or get worse. Document Released: 05/11/2011 Document Revised: 06/13/2012 Document Reviewed: 05/11/2011 [facility] Patient Information 2015 [place]. This information is not intended to replace advice given to you by your health care provider. Make sure you discuss any questions you have with your health care provider.

## 2014-08-07 NOTE — Progress Notes (Signed)
Subjective:    Patient ID: Brittany Foster, female    DOB: 1985/07/26, 29 y.o.   MRN: [id number]  HPI Brittany Foster, a 29 year old female with a history of sickle cell anemia, HbSS presents for a follow up of sickle cell anemia and medication management. Brittany Foster reports that she was recently hospitalized for a pain crisis and was discharged 3 days ago. Brittany Foster states that she has been taking medications consistently and pain intensity has improved. Patient maintains that she is having pain in upper and lower extremities described as 5/10 in intensity, constant and aching. Patient states that she last had Percocet around 4 am with moderate relief.   Brittany Foster also reports a history of depression. She states that she was started on an anti-depressant while hospitalized. She also reports that she was able to speak with a counselor while hospitalized.  She complains of anhedonia and depressed mood.  She denies current suicidal and homicidal plan or intent.      Brittany Foster currently denies fatigue, headache, shortness of breath, chest pain, dysuria, nausea, vomiting, or diarrhea.   Past Medical History  Diagnosis Date  . Sickle cell anemia    History   Social History  . Marital Status: Single    Spouse Name: N/A  . Number of Children: N/A  . Years of Education: N/A   Occupational History  . Not on file.   Social History Main Topics  . Smoking status: Former Games developer  . Smokeless tobacco: Not on file     Comment: smokes once every couple of weeks.   . Alcohol Use: No  . Drug Use: No  . Sexual Activity: Not on file   Other Topics Concern  . Not on file   Social History Narrative   Review of Systems  Constitutional: Negative for fever and unexpected weight change.  Eyes: Negative.   Respiratory: Negative.   Cardiovascular: Negative.   Gastrointestinal: Negative.   Musculoskeletal: Positive for myalgias.  Skin: Negative.   Hematological: Negative.   Psychiatric/Behavioral:  Negative.         Objective:   Physical Exam  Constitutional: She is oriented to person, place, and time. She appears well-developed and well-nourished.  HENT:  Head: Normocephalic and atraumatic.  Right Ear: External ear normal.  Left Ear: External ear normal.  Mouth/Throat: Oropharynx is clear and moist.  Eyes: Conjunctivae are normal. Pupils are equal, round, and reactive to light.  Neck: Normal range of motion. Neck supple.  Pulmonary/Chest: Effort normal and breath sounds normal.  Abdominal: Soft. Bowel sounds are normal.  Musculoskeletal: Normal range of motion.  Neurological: She is alert and oriented to person, place, and time. She has normal reflexes.  Skin: Skin is warm and dry.  Psychiatric: She has a normal mood and affect. Her behavior is normal. Judgment and thought content normal.         BP 119/71 mmHg  Pulse 101  Temp(Src) 98.3 F (36.8 C) (Oral)  Resp 16  Ht 5' (1.524 m)  Wt 115 lb (52.164 kg)  BMI 22.46 kg/m2  LMP 07/17/2014  Assessment & Plan:    1. Hb-SS disease without crisis Sickle cell disease - Patient is currently on Hydroxyurea 1000 mg daily. Patient states that she had been taking medication consistently and ran out prior to hospital admission. We discussed the need for good hydration, monitoring of hydration status, avoidance of heat, cold, stress, and infection triggers. We discussed the risks and benefits of Hydrea,  including bone marrow suppression, the possibility of GI upset, skin ulcers, hair thinning, and teratogenicity. The patient was reminded of the need to seek medical attention of any symptoms of bleeding, anemia, or infection. Continue folic acid 1 mg daily to prevent aplastic bone marrow crises.   Screened urine for proteinuria, negative.   Pulmonary evaluation - Patient denies severe recurrent wheezes, shortness of breath with exercise, or persistent cough. If these symptoms develop, pulmonary function tests with spirometry will be  ordered, and if abnormal, plan on referral to Pulmonology for further evaluation.  Cardiac - Routine screening for pulmonary hypertension is not recommended.  Eye - High risk of proliferative retinopathy. Annual eye exam with retinal exam recommended to patient. Sent a referral for opthalmology.  Immunization status -  Reviewed vaccination. Patient to receive meningiococcal vaccination today.   Acute and chronic painful episodes- Patient was recently admitted to [facility], she was given a prescription for Percocet 10-325 every 4 hours as needed #30, by Dr. Delilah Shan. Dr. Louie Bun consulted with primary physician prior to prescribing medication. Notified [facility] in [place], [place] to verify prescription fill patient was able to get 15 tablets on 08/05/2014. We agreed on Percocet 10-325 mg every 6 hours for moderate to severe pain #100. Patient will return for follow up in 1 month. Patient is not on a long actingplan on titrating her Medication dose. We discussed that pt is to receive her Schedule II prescriptions only from [place]. Pt is also aware that the prescription history is available to [place] online through the [facility] CSRS. Controlled substance agreement signed on 08/07/2014. We reminded Ms. Narula  that all patients receiving Schedule II narcotics must be seen for follow- within one month of prescription being requested. We reviewed the terms of our pain agreement, including the need to keep medicines in a safe locked location away from children or pets, and the need to report excess sedation or constipation, measures to avoid constipation, and policies related to early refills and stolen prescriptions. According to the [place] [facility] program, we have reviewed details related to analgesia, adverse effects, aberrant behaviors.   Iron overload from chronic transfusion.  Patient is not transfusion dependent upon review of past medical history  Vitamin D deficiency -  Reviewed previous vitamin D level, vitamin D level is 8; will start Vitamin D today  - hydroxyurea (HYDREA) 500 MG capsule; Take 2 capsules (1,000 mg total) by mouth daily. May take with food to minimize GI side effects.  Dispense: 60 capsule; Refill: 3 - Ambulatory referral to Ophthalmology - POCT urinalysis dipstick  2. Depression Ms. Tates was recently started on Cymbalta 30 mg for depression prior to leaving the hospital. Patient has been referred to counseling. Reviewed current PHQ-9 screen (3). She reports that she was able to speak with psychology while hospitalized 4 days ago. She states that symptoms of depression have improved.  Patient denies suicidal or homicidal ideations.   - DULoxetine (CYMBALTA) 30 MG capsule; Take 30 mg by mouth daily.  3. Vitamin D deficiency  - Cholecalciferol (VITAMIN D3) 5000 UNITS CAPS; Take 1 capsule (5,000 Units total) by mouth daily.  Dispense: 30 capsule; Refill: 5  4. Chronic prescription opiate use - oxyCODONE-acetaminophen (PERCOCET) 10-325 MG per tablet; Take 1 tablet by mouth every 6 (six) hours as needed for pain.  Dispense: 100 tablet; Refill: 0 - Prescription Monitoring Profile (17)-[facility] - Ambulatory referral to Pain Clinic  5. Immunization due - Meningococcal conjugate vaccine  4-valent IM    Massie MaroonHollis,Ronel Rodeheaver M, FNP

## 2014-08-09 ENCOUNTER — Encounter: Payer: Self-pay | Admitting: Family Medicine

## 2014-08-09 DIAGNOSIS — F329 Major depressive disorder, single episode, unspecified: Secondary | ICD-10-CM | POA: Insufficient documentation

## 2014-08-09 DIAGNOSIS — F32A Depression, unspecified: Secondary | ICD-10-CM | POA: Insufficient documentation

## 2014-08-11 LAB — OPIATES/OPIOIDS (LC/MS-MS)
Codeine Urine: NEGATIVE ng/mL (ref <50)
HYDROCODONE: NEGATIVE ng/mL (ref <50)
HYDROMORPHONE: 177 ng/mL — AB (ref <50)
Morphine Urine: NEGATIVE ng/mL (ref <50)
Norhydrocodone, Ur: NEGATIVE ng/mL (ref <50)
Noroxycodone, Ur: >10000 ng/mL — AB (ref <50)
Oxycodone, ur: 2072 ng/mL — AB (ref <50)
Oxymorphone: 949 ng/mL — AB (ref <50)

## 2014-08-11 LAB — BARBITURATES (GC/LC/MS), URINE
Amobarbital: NEGATIVE ng/mL (ref <100)
BUTALBITAL GC/MS, URINE: 513 ng/mL — AB (ref <100)
PENTABARBITAL GC/MS, URINE: NEGATIVE ng/mL (ref <100)
PHENOBARBITAL GC/MS, URINE: NEGATIVE ng/mL (ref <100)
Secobarbital: NEGATIVE ng/mL (ref <100)

## 2014-08-11 LAB — OXYCODONE, URINE (LC/MS-MS)
Noroxycodone, Ur: >10000 ng/mL — AB (ref <50)
OXYMORPHONE, URINE: 949 ng/mL — AB (ref <50)
Oxycodone, ur: 2072 ng/mL — AB (ref <50)

## 2014-08-13 ENCOUNTER — Telehealth (HOSPITAL_COMMUNITY): Payer: Self-pay | Admitting: Internal Medicine

## 2014-08-13 ENCOUNTER — Non-Acute Institutional Stay (HOSPITAL_COMMUNITY)
Admission: AD | Admit: 2014-08-13 | Discharge: 2014-08-13 | Disposition: A | Discharge status: Home / Self Care | Payer: Medicaid Other | Attending: Internal Medicine | Admitting: Internal Medicine

## 2014-08-13 ENCOUNTER — Encounter (HOSPITAL_COMMUNITY): Payer: Self-pay | Admitting: Hematology

## 2014-08-13 DIAGNOSIS — M79606 Pain in leg, unspecified: Secondary | ICD-10-CM | POA: Diagnosis present

## 2014-08-13 DIAGNOSIS — Z87891 Personal history of nicotine dependence: Secondary | ICD-10-CM | POA: Insufficient documentation

## 2014-08-13 DIAGNOSIS — E876 Hypokalemia: Secondary | ICD-10-CM | POA: Diagnosis not present

## 2014-08-13 DIAGNOSIS — Z791 Long term (current) use of non-steroidal anti-inflammatories (NSAID): Secondary | ICD-10-CM | POA: Insufficient documentation

## 2014-08-13 DIAGNOSIS — D5700 Hb-SS disease with crisis, unspecified: Secondary | ICD-10-CM | POA: Diagnosis not present

## 2014-08-13 DIAGNOSIS — Z79899 Other long term (current) drug therapy: Secondary | ICD-10-CM | POA: Insufficient documentation

## 2014-08-13 DIAGNOSIS — G894 Chronic pain syndrome: Secondary | ICD-10-CM | POA: Diagnosis not present

## 2014-08-13 DIAGNOSIS — Z79891 Long term (current) use of opiate analgesic: Secondary | ICD-10-CM | POA: Diagnosis not present

## 2014-08-13 LAB — COMPREHENSIVE METABOLIC PANEL
ALK PHOS: 91 U/L (ref 38–126)
ALT: 21 U/L (ref 14–54)
ANION GAP: 5 (ref 5–15)
AST: 34 U/L (ref 15–41)
Albumin: 4.4 g/dL (ref 3.5–5.0)
BUN: 8 mg/dL (ref 6–20)
CHLORIDE: 115 mmol/L — AB (ref 101–111)
CO2: 20 mmol/L — ABNORMAL LOW (ref 22–32)
CREATININE: 0.52 mg/dL (ref 0.44–1.00)
Calcium: 8.8 mg/dL — ABNORMAL LOW (ref 8.9–10.3)
GFR calc Af Amer: >60 mL/min (ref >60)
Glucose, Bld: 105 mg/dL — ABNORMAL HIGH (ref 65–99)
Potassium: 3.2 mmol/L — ABNORMAL LOW (ref 3.5–5.1)
Sodium: 140 mmol/L (ref 135–145)
Total Bilirubin: 2.4 mg/dL — ABNORMAL HIGH (ref 0.3–1.2)
Total Protein: 7.5 g/dL (ref 6.5–8.1)

## 2014-08-13 LAB — LACTATE DEHYDROGENASE: LDH: 216 U/L — ABNORMAL HIGH (ref 98–192)

## 2014-08-13 LAB — RETICULOCYTES
RBC.: 2.34 MIL/uL — AB (ref 3.87–5.11)
Retic Count, Absolute: 220 10*3/uL — ABNORMAL HIGH (ref 19.0–186.0)
Retic Ct Pct: 9.4 % — ABNORMAL HIGH (ref 0.4–3.1)

## 2014-08-13 LAB — CBC WITH DIFFERENTIAL/PLATELET
Basophils Absolute: 0.1 10*3/uL (ref 0.0–0.1)
Basophils Relative: 0 % (ref 0–1)
Eosinophils Absolute: 0.1 10*3/uL (ref 0.0–0.7)
Eosinophils Relative: 1 % (ref 0–5)
HEMATOCRIT: 23.7 % — AB (ref 36.0–46.0)
Hemoglobin: 8.2 g/dL — ABNORMAL LOW (ref 12.0–15.0)
LYMPHS ABS: 2.5 10*3/uL (ref 0.7–4.0)
LYMPHS PCT: 17 % (ref 12–46)
MCH: 35 pg — AB (ref 26.0–34.0)
MCHC: 34.6 g/dL (ref 30.0–36.0)
MCV: 101.3 fL — ABNORMAL HIGH (ref 78.0–100.0)
MONO ABS: 1 10*3/uL (ref 0.1–1.0)
Monocytes Relative: 7 % (ref 3–12)
Neutro Abs: 10.9 10*3/uL — ABNORMAL HIGH (ref 1.7–7.7)
Neutrophils Relative %: 75 % (ref 43–77)
PLATELETS: 234 10*3/uL (ref 150–400)
RBC: 2.34 MIL/uL — AB (ref 3.87–5.11)
RDW: 20.6 % — AB (ref 11.5–15.5)
WBC: 14.5 10*3/uL — AB (ref 4.0–10.5)

## 2014-08-13 MED ORDER — FOLIC ACID 1 MG PO TABS: 1.0000 mg | ORAL_TABLET | Freq: Every day | ORAL | Status: DC

## 2014-08-13 MED ORDER — SENNOSIDES-DOCUSATE SODIUM 8.6-50 MG PO TABS: 1.0000 | ORAL_TABLET | Freq: Two times a day (BID) | ORAL | Status: DC

## 2014-08-13 MED ORDER — HYDROMORPHONE 2 MG/ML HIGH CONCENTRATION IV PCA SOLN
INTRAVENOUS | Status: DC
  Administered 2014-08-13: 15 mg via INTRAVENOUS
  Administered 2014-08-13: 5.4 mg via INTRAVENOUS
  Administered 2014-08-13: 12:00:00 via INTRAVENOUS
  Filled 2014-08-13 (×2): qty 25

## 2014-08-13 MED ORDER — KETOROLAC TROMETHAMINE 30 MG/ML IJ SOLN
30.0000 mg | Freq: Four times a day (QID) | INTRAMUSCULAR | Status: DC
  Administered 2014-08-13: 30 mg via INTRAVENOUS
  Filled 2014-08-13: qty 1

## 2014-08-13 MED ORDER — POTASSIUM CHLORIDE CRYS ER 20 MEQ PO TBCR
40.0000 meq | EXTENDED_RELEASE_TABLET | Freq: Once | ORAL | Status: AC
  Administered 2014-08-13: 40 meq via ORAL
  Filled 2014-08-13: qty 2

## 2014-08-13 MED ORDER — NALOXONE HCL 0.4 MG/ML IJ SOLN: 0.4000 mg | INTRAMUSCULAR | Status: DC | PRN

## 2014-08-13 MED ORDER — DIPHENHYDRAMINE HCL 12.5 MG/5ML PO ELIX: 12.5000 mg | ORAL_SOLUTION | Freq: Four times a day (QID) | ORAL | Status: DC | PRN

## 2014-08-13 MED ORDER — POLYETHYLENE GLYCOL 3350 17 G PO PACK: 17.0000 g | PACK | Freq: Every day | ORAL | Status: DC | PRN

## 2014-08-13 MED ORDER — DEXTROSE-NACL 5-0.45 % IV SOLN
INTRAVENOUS | Status: DC
  Administered 2014-08-13: 12:00:00 via INTRAVENOUS

## 2014-08-13 MED ORDER — ONDANSETRON HCL 4 MG/2ML IJ SOLN: 4.0000 mg | Freq: Four times a day (QID) | INTRAMUSCULAR | Status: DC | PRN

## 2014-08-13 MED ORDER — HEPARIN SOD (PORK) LOCK FLUSH 100 UNIT/ML IV SOLN
500.0000 [IU] | INTRAVENOUS | Status: AC | PRN
  Administered 2014-08-13: 500 [IU]
  Filled 2014-08-13: qty 5

## 2014-08-13 MED ORDER — DIPHENHYDRAMINE HCL 50 MG/ML IJ SOLN
12.5000 mg | Freq: Four times a day (QID) | INTRAMUSCULAR | Status: DC | PRN
  Administered 2014-08-13: 12.5 mg via INTRAVENOUS
  Filled 2014-08-13 (×3): qty 0.25

## 2014-08-13 MED ORDER — SODIUM CHLORIDE 0.9 % IJ SOLN
10.0000 mL | INTRAMUSCULAR | Status: AC | PRN
  Administered 2014-08-13: 10 mL

## 2014-08-13 MED ORDER — SODIUM CHLORIDE 0.9 % IJ SOLN: 9.0000 mL | INTRAMUSCULAR | Status: DC | PRN

## 2014-08-13 NOTE — Discharge Summary (Signed)
Physician Discharge Summary  Patient ID: Brittany Foster MRN: [id number] DOB/AGE: May 28, 1985 29 y.o.  Admit date: 08/13/2014 Discharge date: 08/13/2014  Admission Diagnoses:  Discharge Diagnoses:  Active Problems:   Hb-SS disease with crisis   Sickle cell disease with crisis   Discharged Condition: fair  Hospital Course: Patient was admitted to the day hospital today with sickle cell painful crisis. Pain was rated as 10 out of 10. She persistently has chronic pain. Pain has decreased down to 6 out of 10. She is functional and she is being discharged home. She's been informed that our goal is not to make her pain-free but to make her pain more tolerable so she can continue her oral medications. She will follow-up with primary care physician as needed.  Consults: None  Significant Diagnostic Studies: labs: CBC and CMP where checked. Potassium was 3.2 and this was repleted  Treatments: IV hydration and analgesia: Dilaudid  Discharge Exam: Blood pressure 96/53, pulse 74, temperature 98.6 F (37 C), temperature source Oral, resp. rate 13, height 5' (1.524 m), weight 52.164 kg (115 lb), last menstrual period 08/10/2014, SpO2 98 %. General appearance: alert, cooperative and no distress Head: Normocephalic, without obvious abnormality, atraumatic Eyes: conjunctivae/corneas clear. PERRL, EOM's intact. Fundi benign. Neck: no adenopathy, no carotid bruit, no JVD, supple, symmetrical, trachea midline and thyroid not enlarged, symmetric, no tenderness/mass/nodules Back: symmetric, no curvature. ROM normal. No CVA tenderness. Resp: clear to auscultation bilaterally Chest wall: no tenderness Cardio: regular rate and rhythm, S1, S2 normal, no murmur, click, rub or gallop GI: soft, non-tender; bowel sounds normal; no masses,  no organomegaly Extremities: extremities normal, atraumatic, no cyanosis or edema Pulses: 2+ and symmetric Skin: Skin color, texture, turgor normal. No rashes or  lesions Neurologic: Grossly normal  Disposition: 01-Home or Self Care     Medication List    TAKE these medications        ALEVE 220 MG Caps  Generic drug:  Naproxen Sodium  Take 660 mg by mouth every 6 (six) hours as needed (pain).     DULoxetine 30 MG capsule  Commonly known as:  CYMBALTA  Take 30 mg by mouth daily.     folic acid 1 MG tablet  Commonly known as:  FOLVITE  Take 1 tablet (1 mg total) by mouth daily.     gabapentin 300 MG capsule  Commonly known as:  NEURONTIN  Take 1 capsule (300 mg total) by mouth 3 (three) times daily.     hydroxyurea 500 MG capsule  Commonly known as:  HYDREA  Take 2 capsules (1,000 mg total) by mouth daily. May take with food to minimize GI side effects.     oxyCODONE-acetaminophen 10-325 MG per tablet  Commonly known as:  PERCOCET  Take 1 tablet by mouth every 6 (six) hours as needed for pain.     potassium chloride SA 20 MEQ tablet  Commonly known as:  K-DUR,KLOR-CON  Take 1 tablet (20 mEq total) by mouth daily.     Topiramate ER 100 MG Cp24  Take 100 mg by mouth at bedtime.     Vitamin D3 5000 UNITS Caps  Take 1 capsule (5,000 Units total) by mouth daily.         SignedLonia Blood 08/13/2014, 5:25 PM

## 2014-08-13 NOTE — Progress Notes (Signed)
Patient ID: Brittany Foster, female   DOB: May 08, 1985, 29 y.o.   MRN: [id number] Discharge instructions given to patient, port flushed and de-accessed per protocol.  Questions answered.  Patient states pain has improved to 6/10 on pain scale.

## 2014-08-13 NOTE — H&P (Signed)
Kylynn Street is an 29 y.o. female.   Chief Complaint: Pain in back of legs for 3 days HPI: A 29 year old female well-known to our service with sickle cell disease here with sickle cell painful crisis. A shunt is complaining of pain in her back and legs which has been going on for 3 days but today got worse. She has taken her home medications without any relief. She denied any shortness of breath or cough. Denied any nausea vomiting or diarrhea. Pain is rated as 10 out of 10 worsened with activities and no relieving factor. It is dull sharp and aching similar to her typical sickle cell disease. Patient is relatively tolerant to narcotics.  Past Medical History  Diagnosis Date  . Sickle cell anemia     Past Surgical History  Procedure Laterality Date  . Tubal ligation    . Cholecystectomy    . Cesarean section    . Port a cath placement Right     about 6-7 years ago  . Cholecystectomy  2000    Family History  Problem Relation Age of Onset  . Sickle cell anemia Other   . Sickle cell trait Father   . Sickle cell trait Mother    Social History:  reports that she has quit smoking. She does not have any smokeless tobacco history on file. She reports that she does not drink alcohol or use illicit drugs.  Allergies:  Allergies  Allergen Reactions  . Ultram [Tramadol] Other (See Comments)    seizures  . Morphine And Related Hives, Rash and Other (See Comments)    shaking    Medications Prior to Admission  Medication Sig Dispense Refill  . Cholecalciferol (VITAMIN D3) 5000 UNITS CAPS Take 1 capsule (5,000 Units total) by mouth daily. 30 capsule 5  . DULoxetine (CYMBALTA) 30 MG capsule Take 30 mg by mouth daily.    . folic acid (FOLVITE) 1 MG tablet Take 1 tablet (1 mg total) by mouth daily. 30 tablet 11  . gabapentin (NEURONTIN) 300 MG capsule Take 1 capsule (300 mg total) by mouth 3 (three) times daily. 90 capsule 2  . hydroxyurea (HYDREA) 500 MG capsule Take 2 capsules (1,000 mg  total) by mouth daily. May take with food to minimize GI side effects. 60 capsule 3  . Naproxen Sodium (ALEVE) 220 MG CAPS Take 660 mg by mouth every 6 (six) hours as needed (pain).    Marland Kitchen oxyCODONE-acetaminophen (PERCOCET) 10-325 MG per tablet Take 1 tablet by mouth every 6 (six) hours as needed for pain. 100 tablet 0  . potassium chloride SA (K-DUR,KLOR-CON) 20 MEQ tablet Take 1 tablet (20 mEq total) by mouth daily. 30 tablet 1  . Topiramate ER 100 MG CP24 Take 100 mg by mouth at bedtime.      Results for orders placed or performed during the hospital encounter of 08/13/14 (from the past 48 hour(s))  Lactate dehydrogenase     Status: Abnormal   Collection Time: 08/13/14  1:03 PM  Result Value Ref Range   LDH 216 (H) 98 - 192 U/L  Comprehensive metabolic panel     Status: Abnormal   Collection Time: 08/13/14  1:03 PM  Result Value Ref Range   Sodium 140 135 - 145 mmol/L   Potassium 3.2 (L) 3.5 - 5.1 mmol/L   Chloride 115 (H) 101 - 111 mmol/L   CO2 20 (L) 22 - 32 mmol/L   Glucose, Bld 105 (H) 65 - 99 mg/dL   BUN 8 6 -  20 mg/dL   Creatinine, Ser 0.52 0.44 - 1.00 mg/dL   Calcium 8.8 (L) 8.9 - 10.3 mg/dL   Total Protein 7.5 6.5 - 8.1 g/dL   Albumin 4.4 3.5 - 5.0 g/dL   AST 34 15 - 41 U/L   ALT 21 14 - 54 U/L   Alkaline Phosphatase 91 38 - 126 U/L   Total Bilirubin 2.4 (H) 0.3 - 1.2 mg/dL   GFR calc non Af Amer >60 >60 mL/min   GFR calc Af Amer >60 >60 mL/min    Comment: (NOTE) The eGFR has been calculated using the CKD EPI equation. This calculation has not been validated in all clinical situations. eGFR's persistently <60 mL/min signify possible Chronic Kidney Disease.    Anion gap 5 5 - 15  CBC WITH DIFFERENTIAL     Status: Abnormal   Collection Time: 08/13/14  1:03 PM  Result Value Ref Range   WBC 14.5 (H) 4.0 - 10.5 K/uL   RBC 2.34 (L) 3.87 - 5.11 MIL/uL   Hemoglobin 8.2 (L) 12.0 - 15.0 g/dL   HCT 23.7 (L) 36.0 - 46.0 %   MCV 101.3 (H) 78.0 - 100.0 fL   MCH 35.0 (H) 26.0  - 34.0 pg   MCHC 34.6 30.0 - 36.0 g/dL   RDW 20.6 (H) 11.5 - 15.5 %   Platelets 234 150 - 400 K/uL   Neutrophils Relative % 75 43 - 77 %   Neutro Abs 10.9 (H) 1.7 - 7.7 K/uL   Lymphocytes Relative 17 12 - 46 %   Lymphs Abs 2.5 0.7 - 4.0 K/uL   Monocytes Relative 7 3 - 12 %   Monocytes Absolute 1.0 0.1 - 1.0 K/uL   Eosinophils Relative 1 0 - 5 %   Eosinophils Absolute 0.1 0.0 - 0.7 K/uL   Basophils Relative 0 0 - 1 %   Basophils Absolute 0.1 0.0 - 0.1 K/uL  Reticulocytes     Status: Abnormal   Collection Time: 08/13/14  1:03 PM  Result Value Ref Range   Retic Ct Pct 9.4 (H) 0.4 - 3.1 %   RBC. 2.34 (L) 3.87 - 5.11 MIL/uL   Retic Count, Manual 220.0 (H) 19.0 - 186.0 K/uL   No results found.  Review of Systems  Constitutional: Negative.   Eyes: Negative.   Respiratory: Negative.   Cardiovascular: Negative.   Gastrointestinal: Negative.   Genitourinary: Negative.   Musculoskeletal: Positive for myalgias, joint pain and neck pain.  Skin: Negative.   Neurological: Negative.   Endo/Heme/Allergies: Negative.   Psychiatric/Behavioral: Negative.     Blood pressure 96/53, pulse 74, temperature 98.6 F (37 C), temperature source Oral, resp. rate 13, height 5' (1.524 m), weight 52.164 kg (115 lb), last menstrual period 08/10/2014, SpO2 98 %. Physical Exam  Constitutional: She is oriented to person, place, and time. She appears well-developed and well-nourished.  HENT:  Head: Normocephalic and atraumatic.  Right Ear: External ear normal.  Left Ear: External ear normal.  Mouth/Throat: Oropharynx is clear and moist.  Eyes: Conjunctivae and EOM are normal. Pupils are equal, round, and reactive to light.  Neck: Normal range of motion. Neck supple.  Cardiovascular: Normal rate, regular rhythm, normal heart sounds and intact distal pulses.   Respiratory: Effort normal and breath sounds normal.  GI: Soft. Bowel sounds are normal.  Musculoskeletal: Normal range of motion.  Neurological:  She is alert and oriented to person, place, and time. She has normal reflexes.  Skin: Skin is warm and dry.  Psychiatric: She has a normal mood and affect. Her behavior is normal.     Assessment/Plan A 29 year old female here with sickle cell painful crisis.  #1 sickle cell painful crisis: Patient will be admitted to the day hospital. We will start her on IV Dilaudid PCA with Toradol and IV fluids. We'll monitor patient's response to the day. I'll goal is to get her pain to tolerable level so we can discharge her home. She has chronic pain and is constantly in pain so our goal is not to make her pain-free.  #2 sickle cell anemia: We'll continue to monitor her H&H.  #3 hypokalemia: Patient's potassium will be repleted. She'll also be hydrated.  #4 chronic pain syndrome: Continue chronic pain medications per primary care team.  Barbette Merino 08/13/2014, 1:20 PM

## 2014-08-13 NOTE — Telephone Encounter (Signed)
Notified patient that, per MD, she may come to the day hospital for evaluation; pt verbalizes understanding 

## 2014-08-13 NOTE — Telephone Encounter (Signed)
Pt states experiencing pain in stomach and legs; denies nausea, vomiting, diarrhea, SOB, CP; states that having sharp, shooting pain in abdomen; rates pain 7/10; states taken home pain medications with no relief; MD notified

## 2014-08-13 NOTE — Discharge Instructions (Signed)
Sickle Cell Anemia °Sickle cell anemia is a condition where your red blood cells are shaped like sickles. Red blood cells carry oxygen through the body. Sickle-shaped red blood cells do not live as long as normal red blood cells. They also clump together and block blood from flowing through the blood vessels. These things prevent the body from getting enough oxygen. Sickle cell anemia causes organ damage and pain. It also increases the risk of infection. °HOME CARE °· Drink enough fluid to keep your pee (urine) clear or pale yellow. Drink more in hot weather and during exercise. °· Do not smoke. Smoking lowers oxygen levels in the blood. °· Only take over-the-counter or prescription medicines as told by your doctor. °· Take antibiotic medicines as told by your doctor. Make sure you finish them even if you start to feel better. °· Take supplements as told by your doctor. °· Consider wearing a medical alert bracelet. This tells anyone caring for you in an emergency of your condition. °· When traveling, keep your medical information, doctors' names, and the medicines you take with you at all times. °· If you have a fever, do not take fever medicines right away. This could cover up a problem. Tell your doctor. °·  Keep all follow-up visits with your doctor. Sickle cell anemia requires regular medical care. °GET HELP IF: °You have a fever. °GET HELP RIGHT AWAY IF: °· You feel dizzy or faint. °· You have new belly (abdominal) pain, especially on the left side near the stomach area. °· You have a lasting, often uncomfortable and painful erection of the penis (priapism). If it is not treated right away, you will become unable to have sex (impotence). °· You have numbness in your arms or legs or you have a hard time moving them. °· You have a hard time talking. °· You have a fever or lasting symptoms for more than 2-3 days. °· You have a fever and your symptoms suddenly get worse. °· You have signs or symptoms of infection.  These include: °¨ Chills. °¨ Being more tired than normal (lethargy). °¨ Irritability. °¨ Poor eating. °¨ Throwing up (vomiting). °· You have pain that is not helped with medicine. °· You have shortness of breath. °· You have pain in your chest. °· You are coughing up pus-like or bloody mucus. °· You have a stiff neck. °· Your feet or hands swell or have pain. °· Your belly looks bloated. °· Your joints hurt. °MAKE SURE YOU: °· Understand these instructions. °· Will watch your condition. °· Will get help right away if you are not doing well or get worse. °Document Released: 12/07/2012 Document Revised: 07/03/2013 Document Reviewed: 12/07/2012 °Exit[employer]® Patient Information ©2015 [employer]. This information is not intended to replace advice given to you by your health care provider. Make sure you discuss any questions you have with your health care provider. ° °

## 2014-08-14 LAB — PRESCRIPTION MONITORING PROFILE (SOLSTAS)
AMPHETAMINE/METH: NEGATIVE ng/mL
BUPRENORPHINE, URINE: NEGATIVE ng/mL
Benzodiazepine Screen, Urine: NEGATIVE ng/mL
COCAINE METABOLITES: NEGATIVE ng/mL
CREATININE, URINE: 85.66 mg/dL (ref >20.0)
Cannabinoid Scrn, Ur: NEGATIVE ng/mL
Carisoprodol, Urine: NEGATIVE ng/mL
FENTANYL URINE: NEGATIVE ng/mL
MDMA URINE: NEGATIVE ng/mL
MEPERIDINE UR: NEGATIVE ng/mL
Methadone Screen, Urine: NEGATIVE ng/mL
Nitrites, Initial: NEGATIVE ug/mL
Propoxyphene: NEGATIVE ng/mL
TAPENTADOLUR: NEGATIVE ng/mL
Tramadol Scrn, Ur: NEGATIVE ng/mL
Zolpidem, Urine: NEGATIVE ng/mL
pH, Initial: 7.2 pH (ref 4.5–8.9)

## 2014-08-15 ENCOUNTER — Non-Acute Institutional Stay (HOSPITAL_COMMUNITY)
Admission: AD | Admit: 2014-08-15 | Discharge: 2014-08-15 | Disposition: A | Discharge status: Home / Self Care | Payer: Medicaid Other | Source: Ambulatory Visit | Attending: Internal Medicine | Admitting: Internal Medicine

## 2014-08-15 ENCOUNTER — Telehealth (HOSPITAL_COMMUNITY): Payer: Self-pay | Admitting: Hematology

## 2014-08-15 ENCOUNTER — Encounter (HOSPITAL_COMMUNITY): Payer: Self-pay | Admitting: *Deleted

## 2014-08-15 DIAGNOSIS — Z87891 Personal history of nicotine dependence: Secondary | ICD-10-CM | POA: Insufficient documentation

## 2014-08-15 DIAGNOSIS — Z791 Long term (current) use of non-steroidal anti-inflammatories (NSAID): Secondary | ICD-10-CM | POA: Insufficient documentation

## 2014-08-15 DIAGNOSIS — Z79899 Other long term (current) drug therapy: Secondary | ICD-10-CM | POA: Insufficient documentation

## 2014-08-15 DIAGNOSIS — Z79891 Long term (current) use of opiate analgesic: Secondary | ICD-10-CM | POA: Diagnosis not present

## 2014-08-15 DIAGNOSIS — M549 Dorsalgia, unspecified: Secondary | ICD-10-CM | POA: Diagnosis present

## 2014-08-15 DIAGNOSIS — D5700 Hb-SS disease with crisis, unspecified: Secondary | ICD-10-CM | POA: Diagnosis not present

## 2014-08-15 MED ORDER — SODIUM CHLORIDE 0.9 % IJ SOLN: 9.0000 mL | INTRAMUSCULAR | Status: DC | PRN

## 2014-08-15 MED ORDER — ONDANSETRON HCL 4 MG/2ML IJ SOLN: 4.0000 mg | Freq: Four times a day (QID) | INTRAMUSCULAR | Status: DC | PRN

## 2014-08-15 MED ORDER — HYDROMORPHONE HCL 2 MG/ML IJ SOLN
2.0000 mg | Freq: Once | INTRAMUSCULAR | Status: AC
Start: 2014-08-15 — End: 2014-08-15
  Administered 2014-08-15: 2 mg via INTRAVENOUS
  Filled 2014-08-15: qty 1

## 2014-08-15 MED ORDER — HEPARIN SOD (PORK) LOCK FLUSH 100 UNIT/ML IV SOLN
500.0000 [IU] | INTRAVENOUS | Status: AC | PRN
  Administered 2014-08-15: 500 [IU]
  Filled 2014-08-15: qty 5

## 2014-08-15 MED ORDER — DIPHENHYDRAMINE HCL 25 MG PO CAPS: 25.0000 mg | ORAL_CAPSULE | Freq: Four times a day (QID) | ORAL | Status: DC | PRN

## 2014-08-15 MED ORDER — NALOXONE HCL 0.4 MG/ML IJ SOLN: 0.4000 mg | INTRAMUSCULAR | Status: DC | PRN

## 2014-08-15 MED ORDER — DIPHENHYDRAMINE HCL 25 MG PO CAPS
25.0000 mg | ORAL_CAPSULE | Freq: Four times a day (QID) | ORAL | Status: DC | PRN
  Administered 2014-08-15: 50 mg via ORAL
  Filled 2014-08-15: qty 2

## 2014-08-15 MED ORDER — DEXTROSE-NACL 5-0.45 % IV SOLN
INTRAVENOUS | Status: DC
  Administered 2014-08-15: 11:00:00 via INTRAVENOUS

## 2014-08-15 MED ORDER — HYDROMORPHONE HCL 2 MG/ML IJ SOLN
1.6000 mg | Freq: Once | INTRAMUSCULAR | Status: AC
Start: 2014-08-15 — End: 2014-08-15
  Administered 2014-08-15: 1.6 mg via INTRAVENOUS
  Filled 2014-08-15: qty 1

## 2014-08-15 MED ORDER — SODIUM CHLORIDE 0.9 % IJ SOLN
10.0000 mL | INTRAMUSCULAR | Status: AC | PRN
  Administered 2014-08-15: 10 mL

## 2014-08-15 MED ORDER — HYDROMORPHONE 2 MG/ML HIGH CONCENTRATION IV PCA SOLN
INTRAVENOUS | Status: DC
  Administered 2014-08-15: 8.5 mg via INTRAVENOUS
  Administered 2014-08-15: 13:00:00 via INTRAVENOUS
  Administered 2014-08-15: 7 mg via INTRAVENOUS
  Filled 2014-08-15 (×2): qty 25

## 2014-08-15 MED ORDER — KETOROLAC TROMETHAMINE 30 MG/ML IJ SOLN
30.0000 mg | Freq: Four times a day (QID) | INTRAMUSCULAR | Status: DC
  Administered 2014-08-15 (×2): 30 mg via INTRAVENOUS
  Filled 2014-08-15 (×2): qty 1

## 2014-08-15 MED ORDER — DIPHENHYDRAMINE HCL 50 MG/ML IJ SOLN
25.0000 mg | INTRAMUSCULAR | Status: DC | PRN
  Administered 2014-08-15: 25 mg via INTRAVENOUS
  Filled 2014-08-15 (×2): qty 0.5

## 2014-08-15 MED ORDER — HYDROMORPHONE HCL 2 MG/ML IJ SOLN
1.3000 mg | Freq: Once | INTRAMUSCULAR | Status: AC
  Administered 2014-08-15: 1.3 mg via INTRAVENOUS
  Filled 2014-08-15: qty 1

## 2014-08-15 NOTE — Progress Notes (Signed)
Patient C/O of itching.  MD notified for orders, RN will continue to monitor.

## 2014-08-15 NOTE — H&P (Signed)
[facility] History and Physical  Brittany Foster NOM:[id number] DOB: 04-29-1985 DOA: 08/15/2014   PCP: Kymia Simi A., MD   Chief Complaint: Pain in back and legs  HPI: Opiate tolerant patient with Hb SS Pt  well known to me presents with pain in back and legsof sudden onset which began last night. Provocative factor.are likely high ambient temperatures and dehydration. She describes the pain as an intensity of 8/10 and throbbing in nature. The pain is non radiating and she cannot identify any palliative factors.  She denies any associated symptoms.     Review of Systems:  Constitutional: No weight loss, night sweats, Fevers, chills, fatigue.  HEENT: No headaches, dizziness, seizures, vision changes, difficulty swallowing,Tooth/dental problems,Sore throat, No sneezing, itching, ear ache, nasal congestion, post nasal drip,  Cardio-vascular: No chest pain, Orthopnea, PND, swelling in lower extremities, anasarca, dizziness, palpitations  GI: No heartburn, indigestion, abdominal pain, nausea, vomiting, diarrhea, change in bowel habits, loss of appetite  Resp: No shortness of breath with exertion or at rest. No excess mucus, no productive cough, No non-productive cough, No coughing up of blood.No change in color of mucus.No wheezing.No chest wall deformity  Skin: no rash or lesions.  GU: no dysuria, change in color of urine, no urgency or frequency. No flank pain.   Psych: No change in mood or affect. No depression or anxiety. No memory loss.    Past Medical History  Diagnosis Date  . Sickle cell anemia    Past Surgical History  Procedure Laterality Date  . Tubal ligation    . Cholecystectomy    . Cesarean section    . Port a cath placement Right     about 6-7 years ago  . Cholecystectomy  2000   Social History:  reports that she has quit smoking. She does not have any smokeless tobacco history on file. She reports that she does not drink alcohol or use illicit  drugs.  Allergies  Allergen Reactions  . Ultram [Tramadol] Other (See Comments)    seizures  . Morphine And Related Hives, Rash and Other (See Comments)    shaking    Family History  Problem Relation Age of Onset  . Sickle cell anemia Other   . Sickle cell trait Father   . Sickle cell trait Mother     Prior to Admission medications   Medication Sig Start Date End Date Taking? Authorizing Provider  Cholecalciferol (VITAMIN D3) 5000 UNITS CAPS Take 1 capsule (5,000 Units total) by mouth daily. 08/07/14  Yes [name], FNP  DULoxetine (CYMBALTA) 30 MG capsule Take 30 mg by mouth daily.   Yes Historical Provider, MD  folic acid (FOLVITE) 1 MG tablet Take 1 tablet (1 mg total) by mouth daily. 02/16/14  Yes [name], FNP  gabapentin (NEURONTIN) 300 MG capsule Take 1 capsule (300 mg total) by mouth 3 (three) times daily. 02/14/14  Yes Altha Harm, MD  hydroxyurea (HYDREA) 500 MG capsule Take 2 capsules (1,000 mg total) by mouth daily. May take with food to minimize GI side effects. 08/07/14  Yes [name], FNP  Naproxen Sodium (ALEVE) 220 MG CAPS Take 660 mg by mouth every 6 (six) hours as needed (pain).   Yes Historical Provider, MD  oxyCODONE-acetaminophen (PERCOCET) 10-325 MG per tablet Take 1 tablet by mouth every 6 (six) hours as needed for pain. 08/07/14  Yes [name], FNP  potassium chloride SA (K-DUR,KLOR-CON) 20 MEQ tablet Take 1 tablet (20 mEq total)  by mouth daily. 07/03/14  Yes Altha Harm, MD  Topiramate ER 100 MG CP24 Take 100 mg by mouth at bedtime.   Yes Historical Provider, MD   Physical Exam:  08/15/14 1033  BP: 101/66  Pulse: 85  Temp: 98.5 F (36.9 C)  TempSrc:   Resp: 16  SpO2: 100%   General: Alert, awake, oriented x3, in no acute distress.  HEENT: [place]/AT PEERL, EOMI, anicteric Neck: Trachea midline,  no masses, no thyromegal,y no JVD, no carotid bruit OROPHARYNX:  Moist, No exudate/ erythema/lesions.  Heart: Regular rate  and rhythm, without murmurs, rubs, gallops, PMI non-displaced, no heaves or thrills on palpation.  Lungs: Clear to auscultation, no wheezing or rhonchi noted. No increased vocal fremitus resonant to percussion  Abdomen: Soft, nontender, nondistended, positive bowel sounds, no masses no hepatosplenomegaly noted.  Neuro: No focal neurological deficits noted cranial nerves II through XII grossly intact.   Labs on Admission:   Basic Metabolic Panel:  Recent Labs Lab 08/13/14 1303  NA 140  K 3.2*  CL 115*  CO2 20*  GLUCOSE 105*  BUN 8  CREATININE 0.52  CALCIUM 8.8*   Liver Function Tests:  Recent Labs Lab 08/13/14 1303  AST 34  ALT 21  ALKPHOS 91  BILITOT 2.4*  PROT 7.5  ALBUMIN 4.4   CBC:  Recent Labs Lab 08/13/14 1303  WBC 14.5*  NEUTROABS 10.9*  HGB 8.2*  HCT 23.7*  MCV 101.3*  PLT 234    Assessment/Plan: Active Problems: Hb SS with Crisis: Pt will be treated with initially treated with weight based rapid re-dosing IVF and Toradol . If pain is above 7/10 then Pt will be transitioned to a weight based Dilaudid PCA . Patient will be re-evaluated for pain in the context of function and relationship to baseline as care progresses.   Time spend: 35 minutes Code Status: Full Code Disposition Plan: Home after treatment  Kasem Mozer A., MD  Pager [phone]  If 7PM-7AM, please contact night-coverage [email or web address] Password [email or web address] 08/15/2014, 4:29 PM

## 2014-08-15 NOTE — Discharge Instructions (Signed)
Sickle Cell Anemia °Sickle cell anemia is a condition where your red blood cells are shaped like sickles. Red blood cells carry oxygen through the body. Sickle-shaped red blood cells do not live as long as normal red blood cells. They also clump together and block blood from flowing through the blood vessels. These things prevent the body from getting enough oxygen. Sickle cell anemia causes organ damage and pain. It also increases the risk of infection. °HOME CARE °· Drink enough fluid to keep your pee (urine) clear or pale yellow. Drink more in hot weather and during exercise. °· Do not smoke. Smoking lowers oxygen levels in the blood. °· Only take over-the-counter or prescription medicines as told by your doctor. °· Take antibiotic medicines as told by your doctor. Make sure you finish them even if you start to feel better. °· Take supplements as told by your doctor. °· Consider wearing a medical alert bracelet. This tells anyone caring for you in an emergency of your condition. °· When traveling, keep your medical information, doctors' names, and the medicines you take with you at all times. °· If you have a fever, do not take fever medicines right away. This could cover up a problem. Tell your doctor. °·  Keep all follow-up visits with your doctor. Sickle cell anemia requires regular medical care. °GET HELP IF: °You have a fever. °GET HELP RIGHT AWAY IF: °· You feel dizzy or faint. °· You have new belly (abdominal) pain, especially on the left side near the stomach area. °· You have a lasting, often uncomfortable and painful erection of the penis (priapism). If it is not treated right away, you will become unable to have sex (impotence). °· You have numbness in your arms or legs or you have a hard time moving them. °· You have a hard time talking. °· You have a fever or lasting symptoms for more than 2-3 days. °· You have a fever and your symptoms suddenly get worse. °· You have signs or symptoms of infection.  These include: °¨ Chills. °¨ Being more tired than normal (lethargy). °¨ Irritability. °¨ Poor eating. °¨ Throwing up (vomiting). °· You have pain that is not helped with medicine. °· You have shortness of breath. °· You have pain in your chest. °· You are coughing up pus-like or bloody mucus. °· You have a stiff neck. °· Your feet or hands swell or have pain. °· Your belly looks bloated. °· Your joints hurt. °MAKE SURE YOU: °· Understand these instructions. °· Will watch your condition. °· Will get help right away if you are not doing well or get worse. °Document Released: 12/07/2012 Document Revised: 07/03/2013 Document Reviewed: 12/07/2012 °Exit[employer]® Patient Information ©2015 [employer]. This information is not intended to replace advice given to you by your health care provider. Make sure you discuss any questions you have with your health care provider. ° °

## 2014-08-15 NOTE — Discharge Summary (Signed)
Physician Discharge Summary  Brittany Foster SEG:[id number] DOB: [birth year] DOA: 08/15/2014  PCP: [name],[name], MD  Admit date: 08/15/2014 Discharge date: 08/15/2014  Discharge Diagnoses:  Active Problems:   Sickle cell disease with crisis   Discharge Condition:  Good  Disposition: Home  Diet: Home.  Wt Readings from Last 3 Encounters:  08/13/14 115 lb (52.164 kg)  08/07/14 115 lb (52.164 kg)  07/12/14 117 lb (53.071 kg)    History of present illness:  Opiate tolerant patient with Hb SS Pt well known to me presents with pain in back and legsof sudden onset which began last night. Provocative factor.are likely high ambient temperatures and dehydration. She describes the pain as an intensity of 8/10 and throbbing in nature. The pain is non radiating and she cannot identify any palliative factors. She denies any associated symptoms  Hospital Course:  Pt was initially treated with weight based rapid re-dosing IVF and Toradol . Pain remained above 7/10 and Pt was transitioned to a weight based Dilaudid PCA . Patient's pain decreased to 5/10 and she was given a dose of Percocet 10-325 mg. Her function was at baseline at the time of discharge. She is discharged home in stable condition.    Discharge Exam:  Filed Vitals:   08/15/14 1441  BP: 103/66  Pulse: 68  Temp:   Resp: 17   Filed Vitals:   08/15/14 1033 08/15/14 1230 08/15/14 1341 08/15/14 1441  BP: 101/66  92/62 103/66  Pulse: 85  69 68  Temp: 98.5 F (36.9 C)  98.6 F (37 C)   TempSrc:   Oral   Resp: 16 16 15 17   SpO2: 100% 100% 99% 99%     General: Alert, awake, oriented x3, in no acute distress.  HEENT: [place]/AT PEERL, EOMI, mild icterus Neck: Trachea midline,  no masses, no thyromegal,y no JVD, no carotid bruit OROPHARYNX:  Moist, No exudate/ erythema/lesions.  Heart: Regular rate and rhythm, without murmurs, rubs, gallops, PMI non-displaced, no heaves or thrills on palpation.  Lungs: Clear to auscultation,  no wheezing or rhonchi noted.  Abdomen: Soft, nontender, nondistended, positive bowel sounds, no masses no hepatosplenomegaly noted.  Neuro: No focal neurological deficits noted cranial nerves II through XII grossly intact.  Strength at baseline in bilateral upper and lower extremities. Musculoskeletal: No warm swelling or erythema around joints, no spinal tenderness noted. Psychiatric: Patient alert and oriented x3, good insight and cognition, good recent to remote recall.    Discharge Instructions  Discharge Instructions    Activity as tolerated - No restrictions    Complete by:  As directed      Diet general    Complete by:  As directed             Medication List    TAKE these medications        ALEVE 220 MG Caps  Generic drug:  Naproxen Sodium  Take 660 mg by mouth every 6 (six) hours as needed (pain).     DULoxetine 30 MG capsule  Commonly known as:  CYMBALTA  Take 30 mg by mouth daily.     folic acid 1 MG tablet  Commonly known as:  FOLVITE  Take 1 tablet (1 mg total) by mouth daily.     gabapentin 300 MG capsule  Commonly known as:  NEURONTIN  Take 1 capsule (300 mg total) by mouth 3 (three) times daily.     hydroxyurea 500 MG capsule  Commonly known as:  HYDREA  Take 2 capsules (  1,000 mg total) by mouth daily. May take with food to minimize GI side effects.     oxyCODONE-acetaminophen 10-325 MG per tablet  Commonly known as:  PERCOCET  Take 1 tablet by mouth every 6 (six) hours as needed for pain.     potassium chloride SA 20 MEQ tablet  Commonly known as:  K-DUR,KLOR-CON  Take 1 tablet (20 mEq total) by mouth daily.     Topiramate ER 100 MG Cp24  Take 100 mg by mouth at bedtime.     Vitamin D3 5000 UNITS Caps  Take 1 capsule (5,000 Units total) by mouth daily.          The results of significant diagnostics from this hospitalization (including imaging, microbiology, ancillary and laboratory) are listed below for reference.    Significant  Diagnostic Studies: No results found.  Microbiology: No results found for this or any previous visit (from the past 240 hour(s)).   Labs: Basic Metabolic Panel:  Recent Labs Lab 08/13/14 1303  NA 140  K 3.2*  CL 115*  CO2 20*  GLUCOSE 105*  BUN 8  CREATININE 0.52  CALCIUM 8.8*   Liver Function Tests:  Recent Labs Lab 08/13/14 1303  AST 34  ALT 21  ALKPHOS 91  BILITOT 2.4*  PROT 7.5  ALBUMIN 4.4   No results for input(s): LIPASE, AMYLASE in the last 168 hours. No results for input(s): AMMONIA in the last 168 hours. CBC:  Recent Labs Lab 08/13/14 1303  WBC 14.5*  NEUTROABS 10.9*  HGB 8.2*  HCT 23.7*  MCV 101.3*  PLT 234   Cardiac Enzymes: No results for input(s): CKTOTAL, CKMB, CKMBINDEX, TROPONINI in the last 168 hours. BNP: Invalid input(s): POCBNP CBG: No results for input(s): GLUCAP in the last 168 hours. Ferritin: No results for input(s): FERRITIN in the last 168 hours.  Time coordinating discharge: 35 minutes  Signed:  [name],[name]  08/15/2014, 4:56 PM

## 2014-08-15 NOTE — Telephone Encounter (Signed)
Spoke to the provider, advised patient she can come to [facility].  Patient states she is trying to find a babysitter and asked if she could bring her child with her.  I advised patient that she could not bring her daughter as there is a possibility she will get IV narcotics and will not be able to watch her.  Explained that nursing staff cannot assume the responsibility of the child.  Patient verbalizes understanding.

## 2014-08-15 NOTE — Progress Notes (Signed)
Patient ID: Brittany Foster, female   DOB: 03-04-1985, 29 y.o.   MRN: [id number] Discharge instructions given to patient, along with follow up information.  Port flushed and de-accessed per protocol.  Belongings returned.  Patient states pain is currently at 5/10 in legs and back.

## 2014-08-15 NOTE — Telephone Encounter (Signed)
Patient C/O pain to legs, chest and stomach. Denies radiation down arm, neck or jaw, denies shortness of breath or difficulty breathing, Denies V/D.  C/O of nausea but has not taking anything.  Patient states she has only taken percocet for pain with the last dose being around 0500.  I advised that I would notify the physician and give her a call back.  Patient verbalizes understanding.

## 2014-08-17 ENCOUNTER — Telehealth (HOSPITAL_COMMUNITY): Payer: Self-pay

## 2014-08-17 NOTE — Telephone Encounter (Signed)
Patient complaining of pain 8/10.  Would like to come to day hospital. Patient told I would page MD.

## 2014-08-17 NOTE — Telephone Encounter (Signed)
Returned patient call. With findings reported, MD stated it was not appropriate for her to come to the day hospital at this time.

## 2014-08-20 ENCOUNTER — Telehealth (HOSPITAL_COMMUNITY): Payer: Self-pay | Admitting: Hematology

## 2014-08-20 NOTE — Telephone Encounter (Signed)
Called patient and advised that per Dr. Nathanial Millman she needed to be here by 1:00 p.m. For treatment.  Patient verbalized understanding.  Advised patient that she can go to the ED if needed, or if she could manage at home she could call back in the morning for triage.

## 2014-08-21 ENCOUNTER — Telehealth (HOSPITAL_COMMUNITY): Payer: Self-pay | Admitting: Hematology

## 2014-08-21 ENCOUNTER — Encounter (HOSPITAL_COMMUNITY): Payer: Self-pay

## 2014-08-21 ENCOUNTER — Other Ambulatory Visit: Payer: Self-pay | Admitting: Internal Medicine

## 2014-08-21 ENCOUNTER — Non-Acute Institutional Stay (HOSPITAL_COMMUNITY)
Admission: AD | Admit: 2014-08-21 | Discharge: 2014-08-21 | Discharge status: Against Medical Advice | Payer: Medicaid Other | Attending: Internal Medicine | Admitting: Internal Medicine

## 2014-08-21 DIAGNOSIS — D5700 Hb-SS disease with crisis, unspecified: Secondary | ICD-10-CM | POA: Insufficient documentation

## 2014-08-21 DIAGNOSIS — E876 Hypokalemia: Secondary | ICD-10-CM

## 2014-08-21 DIAGNOSIS — D571 Sickle-cell disease without crisis: Secondary | ICD-10-CM | POA: Diagnosis present

## 2014-08-21 LAB — COMPREHENSIVE METABOLIC PANEL
ALT: 24 U/L (ref 14–54)
AST: 34 U/L (ref 15–41)
Albumin: 4.1 g/dL (ref 3.5–5.0)
Alkaline Phosphatase: 107 U/L (ref 38–126)
Anion gap: 8 (ref 5–15)
BILIRUBIN TOTAL: 2.1 mg/dL — AB (ref 0.3–1.2)
CALCIUM: 8.7 mg/dL — AB (ref 8.9–10.3)
CO2: 22 mmol/L (ref 22–32)
Chloride: 113 mmol/L — ABNORMAL HIGH (ref 101–111)
Creatinine, Ser: 0.47 mg/dL (ref 0.44–1.00)
GFR calc non Af Amer: >60 mL/min (ref >60)
GLUCOSE: 114 mg/dL — AB (ref 65–99)
Potassium: 2.8 mmol/L — ABNORMAL LOW (ref 3.5–5.1)
Sodium: 143 mmol/L (ref 135–145)
Total Protein: 7.1 g/dL (ref 6.5–8.1)

## 2014-08-21 LAB — CBC WITH DIFFERENTIAL/PLATELET
Basophils Absolute: 0 10*3/uL (ref 0.0–0.1)
Basophils Relative: 0 % (ref 0–1)
Eosinophils Absolute: 0 10*3/uL (ref 0.0–0.7)
Eosinophils Relative: 0 % (ref 0–5)
HCT: 22.2 % — ABNORMAL LOW (ref 36.0–46.0)
Hemoglobin: 7.7 g/dL — ABNORMAL LOW (ref 12.0–15.0)
LYMPHS PCT: 22 % (ref 12–46)
Lymphs Abs: 2.4 10*3/uL (ref 0.7–4.0)
MCH: 35 pg — AB (ref 26.0–34.0)
MCHC: 34.7 g/dL (ref 30.0–36.0)
MCV: 100.9 fL — ABNORMAL HIGH (ref 78.0–100.0)
MONO ABS: 0.8 10*3/uL (ref 0.1–1.0)
Monocytes Relative: 7 % (ref 3–12)
NRBC: 4 /100{WBCs} — AB
Neutro Abs: 7.8 10*3/uL — ABNORMAL HIGH (ref 1.7–7.7)
Neutrophils Relative %: 71 % (ref 43–77)
Platelets: 252 10*3/uL (ref 150–400)
RBC: 2.2 MIL/uL — ABNORMAL LOW (ref 3.87–5.11)
RDW: 22.8 % — AB (ref 11.5–15.5)
WBC: 11 10*3/uL — ABNORMAL HIGH (ref 4.0–10.5)

## 2014-08-21 LAB — RETICULOCYTES
RBC.: 2.2 MIL/uL — ABNORMAL LOW (ref 3.87–5.11)
RETIC CT PCT: 18.8 % — AB (ref 0.4–3.1)
Retic Count, Absolute: 413.6 10*3/uL — ABNORMAL HIGH (ref 19.0–186.0)

## 2014-08-21 LAB — LACTATE DEHYDROGENASE: LDH: 236 U/L — ABNORMAL HIGH (ref 98–192)

## 2014-08-21 MED ORDER — HEPARIN SOD (PORK) LOCK FLUSH 100 UNIT/ML IV SOLN
500.0000 [IU] | INTRAVENOUS | Status: AC | PRN
  Administered 2014-08-21: 500 [IU]
  Filled 2014-08-21: qty 5

## 2014-08-21 MED ORDER — KCL IN DEXTROSE-NACL 20-5-0.45 MEQ/L-%-% IV SOLN
INTRAVENOUS | Status: DC
  Administered 2014-08-21: 14:00:00 via INTRAVENOUS
  Filled 2014-08-21 (×2): qty 1000

## 2014-08-21 MED ORDER — KETOROLAC TROMETHAMINE 30 MG/ML IJ SOLN
30.0000 mg | Freq: Once | INTRAMUSCULAR | Status: AC
  Administered 2014-08-21: 30 mg via INTRAVENOUS
  Filled 2014-08-21: qty 1

## 2014-08-21 MED ORDER — HYDROMORPHONE HCL 2 MG/ML IJ SOLN
1.0000 mg | Freq: Once | INTRAMUSCULAR | Status: AC
  Administered 2014-08-21: 1 mg via INTRAVENOUS
  Filled 2014-08-21: qty 1

## 2014-08-21 MED ORDER — HYDROMORPHONE HCL 2 MG/ML IJ SOLN
1.0000 mg | INTRAMUSCULAR | Status: DC | PRN
  Administered 2014-08-21: 1 mg via INTRAVENOUS
  Filled 2014-08-21: qty 1

## 2014-08-21 MED ORDER — SODIUM CHLORIDE 0.9 % IJ SOLN: 10.0000 mL | INTRAMUSCULAR | Status: DC | PRN

## 2014-08-21 NOTE — Telephone Encounter (Signed)
Patient C/O pain to back and chest that is 8/10 on pain scale.  Patient states she was seen in the ED last night and told to call the [facility] this morning.  Patient encouraged to take pain medication as prescribed, increase fluid intake and to rest.  I advised I would notify the physician and give her a call back.

## 2014-08-21 NOTE — Progress Notes (Signed)
Dr. Rito Ehrlich notified that lab results are in; notified that Dr. Hyman Hopes to be notified for care of patient; will continue to monitor

## 2014-08-21 NOTE — Telephone Encounter (Signed)
Called patient and advised that per Dr. Rito Ehrlich, she could come to the [facility].  I explained that we would draw labs when she arrives prior to starting treatment.  Patient said she will decide if she is going to come to [facility].

## 2014-08-21 NOTE — Progress Notes (Signed)
Patient arrived at [facility].  Dr. Rito Ehrlich notified verbally.  Lab orders recvd.

## 2014-08-21 NOTE — Progress Notes (Signed)
Pt signed AMA form; port deaccessed; pt alert, oriented, and ambulatory; no complications noted

## 2014-08-21 NOTE — Progress Notes (Signed)
Pt requests to leave day hospital right now; Dr. Hyman Hopes notified of her request; pt informed that if she leaves at this time, she will be leaving against medical advice; pt verbalizes understanding

## 2014-08-24 NOTE — Progress Notes (Signed)
Patient ID: Brittany Foster, female   DOB: [birth year], 29 y.o.   MRN: [id number]  CC: Pt here today to follow up after recent hospitalization and for evaluation of need for opiate pain medication. Pt has Hb SS but is also felt to have a component of chronic pain for which she should be evaluated by pain management specialist. She is very resistant to going to a pain specialist. She has been referred in the past to a pain clinic but was dismissed because she was found to have Marijuana in her system. She reports that she no longer uses Marijuana but has had such a negative experience that she does not want to go back. She is currently on MME of 60 mg and although on it chronically, she is functional with this dose.  HPI:  Allergies  Allergen Reactions  . Ultram [Tramadol] Other (See Comments)    seizures  . Morphine And Related Hives, Rash and Other (See Comments)    shaking   Past Medical History  Diagnosis Date  . Sickle cell anemia    Current Outpatient Prescriptions on File Prior to Visit  Medication Sig Dispense Refill  . folic acid (FOLVITE) 1 MG tablet Take 1 tablet (1 mg total) by mouth daily. 30 tablet 11  . gabapentin (NEURONTIN) 300 MG capsule Take 1 capsule (300 mg total) by mouth 3 (three) times daily. 90 capsule 2  . Naproxen Sodium (ALEVE) 220 MG CAPS Take 660 mg by mouth every 6 (six) hours as needed (pain).    . Topiramate ER 100 MG CP24 Take 100 mg by mouth at bedtime.    . potassium chloride SA (K-DUR,KLOR-CON) 20 MEQ tablet Take 1 tablet (20 mEq total) by mouth daily. 30 tablet 1   No current facility-administered medications on file prior to visit.   Family History  Problem Relation Age of Onset  . Sickle cell anemia Other   . Sickle cell trait Father   . Sickle cell trait Mother    History   Social History  . Marital Status: Single    Spouse Name: N/A  . Number of Children: N/A  . Years of Education: N/A   Occupational History  . Not on file.   Social  History Main Topics  . Smoking status: Former Games developer  . Smokeless tobacco: Not on file     Comment: smokes once every couple of weeks.   . Alcohol Use: No  . Drug Use: No  . Sexual Activity: Not on file   Other Topics Concern  . Not on file   Social History Narrative    Review of Systems: Constitutional: Negative for fever, chills, diaphoresis, activity change, appetite change and fatigue. HENT: Negative for ear pain, nosebleeds, congestion, facial swelling, rhinorrhea, neck pain, neck stiffness and ear discharge.  Eyes: Negative for pain, discharge, redness, itching and visual disturbance. Respiratory: Negative for cough, choking, chest tightness, shortness of breath, wheezing and stridor.  Cardiovascular: Negative for chest pain, palpitations and leg swelling. Gastrointestinal: Negative for abdominal distention. Genitourinary: Negative for dysuria, urgency, frequency, hematuria, flank pain, decreased urine volume, difficulty urinating and dyspareunia.  Musculoskeletal: Negative for back pain, joint swelling, arthralgias and gait problem. Neurological: Negative for dizziness, tremors, seizures, syncope, facial asymmetry, speech difficulty, weakness, light-headedness, numbness and headaches.  Hematological: Negative for adenopathy. Does not bruise/bleed easily. Psychiatric/Behavioral: Negative for hallucinations, behavioral problems, confusion, dysphoric mood, decreased concentration and agitation.    Objective:   Filed Vitals:   07/05/14 1616  BP: 115/67  Pulse: 93  Temp: 98.7 F (37.1 C)  Resp: 16    Physical Exam: Constitutional: Patient appears well-developed and well-nourished. No distress. HENT: Normocephalic, atraumatic, External right and left ear normal. Oropharynx is clear and moist.  Eyes: Conjunctivae and EOM are normal. PERRLA, no scleral icterus. Neck: Normal ROM. Neck supple. No JVD. No tracheal deviation. No thyromegaly. CVS: RRR, S1/S2 +, no murmurs, no  gallops, no carotid bruit.  Pulmonary: Effort and breath sounds normal, no stridor, rhonchi, wheezes, rales.  Abdominal: Soft. BS +,  no distension, tenderness, rebound or guarding.  Musculoskeletal: Normal range of motion. No edema and no tenderness.  Lymphadenopathy: No lymphadenopathy noted, cervical, inguinal or axillary Neuro: Alert. Normal reflexes, muscle tone coordination. No cranial nerve deficit. Skin: Skin is warm and dry. No rash noted. Not diaphoretic. No erythema. No pallor. Psychiatric: Normal mood and affect. Behavior, judgment, thought content normal.       Assessment and plan:  1. Chronic prescription opiate use - Continue Percocet 10-325 mg q 6 hours as needed. Will see patient on a monthly basis to evaluate for possible decrease in medications and to continue to assess risk. However for now will maintain at current dose.    2. Hb SS without crisis - increase Hydrea to 1000 mg daily.         [name],[name], MD [facility] [place], [place] [id number]  08/24/2014, 12:12 PM

## 2014-08-27 ENCOUNTER — Encounter: Payer: Self-pay | Admitting: Internal Medicine

## 2014-08-29 ENCOUNTER — Telehealth (HOSPITAL_COMMUNITY): Payer: Self-pay | Admitting: *Deleted

## 2014-08-29 ENCOUNTER — Non-Acute Institutional Stay (HOSPITAL_COMMUNITY)
Admission: AD | Admit: 2014-08-29 | Discharge: 2014-08-29 | Disposition: A | Discharge status: Home / Self Care | Payer: Medicaid Other | Attending: Internal Medicine | Admitting: Internal Medicine

## 2014-08-29 ENCOUNTER — Encounter (HOSPITAL_COMMUNITY): Payer: Self-pay | Admitting: Hematology

## 2014-08-29 DIAGNOSIS — M549 Dorsalgia, unspecified: Secondary | ICD-10-CM | POA: Diagnosis present

## 2014-08-29 DIAGNOSIS — Z885 Allergy status to narcotic agent status: Secondary | ICD-10-CM | POA: Insufficient documentation

## 2014-08-29 DIAGNOSIS — Z886 Allergy status to analgesic agent status: Secondary | ICD-10-CM | POA: Insufficient documentation

## 2014-08-29 DIAGNOSIS — D571 Sickle-cell disease without crisis: Secondary | ICD-10-CM | POA: Insufficient documentation

## 2014-08-29 DIAGNOSIS — E876 Hypokalemia: Secondary | ICD-10-CM | POA: Diagnosis not present

## 2014-08-29 DIAGNOSIS — Z79899 Other long term (current) drug therapy: Secondary | ICD-10-CM | POA: Diagnosis not present

## 2014-08-29 DIAGNOSIS — D5700 Hb-SS disease with crisis, unspecified: Secondary | ICD-10-CM | POA: Insufficient documentation

## 2014-08-29 LAB — BASIC METABOLIC PANEL
ANION GAP: 8 (ref 5–15)
BUN: 9 mg/dL (ref 6–20)
CO2: 20 mmol/L — ABNORMAL LOW (ref 22–32)
Calcium: 9 mg/dL (ref 8.9–10.3)
Chloride: 112 mmol/L — ABNORMAL HIGH (ref 101–111)
Creatinine, Ser: 0.5 mg/dL (ref 0.44–1.00)
GFR calc non Af Amer: >60 mL/min (ref >60)
Glucose, Bld: 81 mg/dL (ref 65–99)
Potassium: 2.8 mmol/L — ABNORMAL LOW (ref 3.5–5.1)
SODIUM: 140 mmol/L (ref 135–145)

## 2014-08-29 LAB — LACTATE DEHYDROGENASE: LDH: 266 U/L — ABNORMAL HIGH (ref 98–192)

## 2014-08-29 MED ORDER — SODIUM CHLORIDE 0.9 % IJ SOLN
10.0000 mL | INTRAMUSCULAR | Status: AC | PRN
  Administered 2014-08-29: 10 mL

## 2014-08-29 MED ORDER — DIPHENHYDRAMINE HCL 12.5 MG/5ML PO ELIX: 12.5000 mg | ORAL_SOLUTION | Freq: Four times a day (QID) | ORAL | Status: DC | PRN

## 2014-08-29 MED ORDER — HEPARIN SOD (PORK) LOCK FLUSH 100 UNIT/ML IV SOLN
500.0000 [IU] | INTRAVENOUS | Status: AC | PRN
  Administered 2014-08-29: 500 [IU]
  Filled 2014-08-29: qty 5

## 2014-08-29 MED ORDER — NALOXONE HCL 0.4 MG/ML IJ SOLN: 0.4000 mg | INTRAMUSCULAR | Status: DC | PRN

## 2014-08-29 MED ORDER — ONDANSETRON HCL 4 MG PO TABS
4.0000 mg | ORAL_TABLET | Freq: Once | ORAL | Status: AC
  Administered 2014-08-29: 4 mg via ORAL
  Filled 2014-08-29: qty 1

## 2014-08-29 MED ORDER — DEXTROSE-NACL 5-0.45 % IV SOLN
INTRAVENOUS | Status: DC
  Administered 2014-08-29: 12:00:00 via INTRAVENOUS

## 2014-08-29 MED ORDER — ONDANSETRON HCL 4 MG/2ML IJ SOLN: 4.0000 mg | Freq: Once | INTRAMUSCULAR | Status: AC

## 2014-08-29 MED ORDER — FOLIC ACID 1 MG PO TABS: 1.0000 mg | ORAL_TABLET | Freq: Every day | ORAL | Status: DC

## 2014-08-29 MED ORDER — PANTOPRAZOLE SODIUM 40 MG PO TBEC
40.0000 mg | DELAYED_RELEASE_TABLET | Freq: Once | ORAL | Status: AC
  Filled 2014-08-29: qty 1

## 2014-08-29 MED ORDER — DIPHENHYDRAMINE HCL 50 MG/ML IJ SOLN
12.5000 mg | Freq: Four times a day (QID) | INTRAMUSCULAR | Status: DC | PRN
  Filled 2014-08-29: qty 0.25

## 2014-08-29 MED ORDER — PANTOPRAZOLE SODIUM 40 MG IV SOLR
40.0000 mg | Freq: Once | INTRAVENOUS | Status: AC
  Administered 2014-08-29: 40 mg via INTRAVENOUS
  Filled 2014-08-29: qty 40

## 2014-08-29 MED ORDER — HYDROMORPHONE 2 MG/ML HIGH CONCENTRATION IV PCA SOLN
INTRAVENOUS | Status: DC
  Administered 2014-08-29: 12:00:00 via INTRAVENOUS
  Filled 2014-08-29 (×2): qty 25

## 2014-08-29 MED ORDER — SODIUM CHLORIDE 0.9 % IJ SOLN: 9.0000 mL | INTRAMUSCULAR | Status: DC | PRN

## 2014-08-29 NOTE — H&P (Signed)
[facility] History and Physical  Brittany Foster ONG:[id number] DOB: Jun 30, 1985 DOA: 08/29/2014  PCP: [name],[name], MD   Chief Complaint: Pain in back and Legs x 3 days  HPI: Brittany Foster is a 29 y.o. female opiate tolerant with Hb SS presents to the Day Clinic with pain in her back and legs that started about 3 days ago but getting worse in the last 24 hours not responding to oral medication. She rated her pain at about 8 out of 10 and throbbing in nature. Pain is nonradiating, no other relieving factors, aggravated by walking or activity. She denies fever, no chest pain, no shortness of breath. She endorses mild nausea but no vomiting or diarrhea. ROS: General: The patient denies anorexia, fever, weight loss Cardiac: Denies chest pain, syncope, palpitations, pedal edema  Respiratory: Denies cough, shortness of breath, wheezing GI: Denies severe indigestion/heartburn, abdominal pain, nausea, vomiting, diarrhea and constipation GU: Denies hematuria, incontinence, dysuria  Musculoskeletal: Denies arthritis  Skin: Denies suspicious skin lesions Neurologic: Denies focal weakness or numbness, change in vision  Past Medical History  Diagnosis Date  . Sickle cell anemia     Past Surgical History  Procedure Laterality Date  . Tubal ligation    . Cholecystectomy    . Cesarean section    . Port a cath placement Right     about 6-7 years ago  . Cholecystectomy  2000    Social History: Lives at Home  Allergies  Allergen Reactions  . Ultram [Tramadol] Other (See Comments)    seizures  . Morphine And Related Hives, Rash and Other (See Comments)    shaking    Family History  Problem Relation Age of Onset  . Sickle cell anemia Other   . Sickle cell trait Father   . Sickle cell trait Mother       Prior to Admission medications   Medication Sig Start Date End Date Taking? Authorizing Provider  DULoxetine (CYMBALTA) 30 MG capsule Take 30 mg by mouth daily.    Yes Historical Provider, MD  folic acid (FOLVITE) 1 MG tablet Take 1 tablet (1 mg total) by mouth daily. 02/16/14  Yes [name], FNP  gabapentin (NEURONTIN) 300 MG capsule Take 1 capsule (300 mg total) by mouth 3 (three) times daily. 02/14/14  Yes Altha Harm, MD  hydroxyurea (HYDREA) 500 MG capsule Take 2 capsules (1,000 mg total) by mouth daily. May take with food to minimize GI side effects. 08/07/14  Yes [name], FNP  Naproxen Sodium (ALEVE) 220 MG CAPS Take 660 mg by mouth every 6 (six) hours as needed (pain).   Yes Historical Provider, MD  oxyCODONE-acetaminophen (PERCOCET) 10-325 MG per tablet Take 1 tablet by mouth every 6 (six) hours as needed for pain. 08/07/14  Yes [name], FNP  Topiramate ER 100 MG CP24 Take 100 mg by mouth at bedtime.   Yes Historical Provider, MD  Cholecalciferol (VITAMIN D3) 5000 UNITS CAPS Take 1 capsule (5,000 Units total) by mouth daily. 08/07/14   [name], FNP  potassium chloride SA (K-DUR,KLOR-CON) 20 MEQ tablet Take 1 tablet (20 mEq total) by mouth daily. 07/03/14   Altha Harm, MD     Physical Exam: Filed Vitals:   08/29/14 1108  BP: 114/72  Pulse: 106  Temp: 98.9 F (37.2 C)  TempSrc: Oral  Resp: 20  SpO2: 100%     General: Vitals reviewed and noted, patient is alert, awake, not in any distress, afebrile  HEENT: Normocephalic and Atraumatic, Mucous membranes pink                PERRLA; EOM intact; No scleral icterus,                 Nares: Patent, Oropharynx: Clear, Fair Dentition                 Neck: FROM, no cervical lymphadenopathy, thyromegaly, carotid bruit or JVD;  Breasts: deferred CHEST WALL: No tenderness  CHEST: Normal respiration, clear to auscultation bilaterally  HEART: Regular rate and rhythm; no murmurs rubs or gallops  BACK: No kyphosis or scoliosis; no CVA tenderness  ABDOMEN: Positive Bowel Sounds, soft, non-tender; no masses, no organomegaly EXTREMITIES: No cyanosis, clubbing, or  edema SKIN:  no rash or ulceration  CNS: Alert and Oriented x 4, Nonfocal exam, CN 2-12 intact  Labs on Admission:  Basic Metabolic Panel: No results for input(s): NA, K, CL, CO2, GLUCOSE, BUN, CREATININE, CALCIUM, MG, PHOS in the last 168 hours. Liver Function Tests: No results for input(s): AST, ALT, ALKPHOS, BILITOT, PROT, ALBUMIN in the last 168 hours. No results for input(s): LIPASE, AMYLASE in the last 168 hours. No results for input(s): AMMONIA in the last 168 hours. CBC: No results for input(s): WBC, NEUTROABS, HGB, HCT, MCV, PLT in the last 168 hours. Cardiac Enzymes: No results for input(s): CKTOTAL, CKMB, CKMBINDEX, TROPONINI in the last 168 hours.  BNP (last 3 results)  Recent Labs  03/18/14 1811 04/29/14 1459  BNP 78.1 114.5*    Assessment/Plan Active Problems:   Sickle cell anemia with pain  Admit patient to the day hospital, treat with weight based Dilaudid PCA, IV fluid, and Toradol. Patient will be reevaluated for pain in the context of function with her baseline as a benchmark. Monitor vital signs closely Follow sickle cell Pain management protocol Patient will be discharged home if pain scale improves If pain remains above 7 out of 10 by the end of the day hospital hour, patient will be transferred to inpatient.  Code Status: Full Family Communication: None DVT Prophylaxis: Ambulate as tolerated  Time spent: 30 minutes  Aluel Schwarz, MD 08/29/2014, 12:20 PM

## 2014-08-29 NOTE — Telephone Encounter (Signed)
Opened in error

## 2014-08-29 NOTE — Progress Notes (Signed)
Pt discharged home ambulatory, pt received all discharge instructions, belongings, and follow-up appointment. Pt verbalized understanding of all discharge instructions. Port flushed and de-accessed per protocol. Pt left a&ox4, pt in no distress, steady gait. Pt denies any further needs at time of departure.

## 2014-08-29 NOTE — Telephone Encounter (Signed)
Pt notified [facility] for c/o pain in her legs, hips and back. Pt rated her pain 8 out of 10. Denies fever, chest pain, n/v or abdominal pain. Dr. Christiana FuchsJegeda was notified.

## 2014-08-29 NOTE — Telephone Encounter (Signed)
Dr. Christiana FuchsJegeda was notified and patient was notified to come to the day clinic.

## 2014-08-30 ENCOUNTER — Telehealth (HOSPITAL_COMMUNITY): Payer: Self-pay | Admitting: *Deleted

## 2014-08-30 NOTE — Discharge Summary (Signed)
Physician Discharge Summary  Brittany Foster ZOX:[id number] DOB: 1985-12-30 DOA: 08/29/2014  PCP: [name],[name], MD  Admit date: 08/29/2014 Discharge date: 08/29/2014 Time spent: 30 minutes  Recommendations for Outpatient Follow-up:  1. Repeat serum potassium 2. Check CBC and differential  Discharge Diagnoses:  Active Problems:   Sickle cell anemia with pain  Hypokalemia  Discharge Condition: Stable  Diet recommendation: Regular  There were no vitals filed for this visit.  History of present illness:  Brittany Foster is a 29 y.o. female opiate tolerant with Hb SS presents to the Day Clinic with pain in her back and legs that started about 3 days ago but getting worse in the last 24 hours not responding to oral medication. She rated her pain at about 8 out of 10 and throbbing in nature. Pain is nonradiating, no other relieving factors, aggravated by walking or activity. She denies fever, no chest pain, no shortness of breath. She endorses mild nausea but no vomiting or diarrhea.  Hospital Course:  Patient was treated with weight based Dilaudid PCA, IV fluid and IV Toradol. The patient's pain decreased to about 5 out of 10, her function was at baseline at the time of discharge. Patient was discharged home in stable condition. She was given specific instruction on her potassium tablets and her home pain medication. Return precautions were discussed and patient verbalized understanding.    Discharge Exam: Filed Vitals:   08/29/14 1530  BP: 104/60  Pulse: 77  Temp: 98.9 F (37.2 C)  Resp: 18    General: Alert, awake, oriented x3, in no acute distress.  HEENT: [place]/AT PEERL, EOMI, mild icterus Neck: Trachea midline, no masses, no thyromegal,y no JVD, no carotid bruit OROPHARYNX: Moist, No exudate/ erythema/lesions.  Heart: Regular rate and rhythm, without murmurs, rubs, gallops, PMI non-displaced, no heaves or thrills on palpation.  Lungs: Clear to auscultation, no wheezing  or rhonchi noted.  Abdomen: Soft, nontender, nondistended, positive bowel sounds, no masses no hepatosplenomegaly noted.  Neuro: No focal neurological deficits noted cranial nerves II through XII grossly intact. Strength at baseline in bilateral upper and lower extremities. Musculoskeletal: No warm swelling or erythema around joints, no spinal tenderness noted. Psychiatric: Patient alert and oriented x3, good insight and cognition, good recent to remote recall.  Discharge Instructions - Continue home pain medications - Fill your prescription of potassium chloride tablet 20 mEq, take 1 tablet by mouth daily, will recheck a potassium in one week. -Follow-up in the regular clinic in one week.   Current Discharge Medication List    CONTINUE these medications which have NOT CHANGED   Details  DULoxetine (CYMBALTA) 30 MG capsule Take 30 mg by mouth daily.   Associated Diagnoses: Depression    folic acid (FOLVITE) 1 MG tablet Take 1 tablet (1 mg total) by mouth daily. 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 Qty: 90 capsule, Refills: 2    hydroxyurea (HYDREA) 500 MG capsule Take 2 capsules (1,000 mg total) by mouth daily. May take with food to minimize GI side effects. 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    oxyCODONE-acetaminophen (PERCOCET) 10-325 MG per tablet Take 1 tablet by mouth every 6 (six) hours as needed for  pain. Qty: 100 tablet, Refills: 0   Associated Diagnoses: Chronic prescription opiate use    Topiramate ER 100 MG CP24 Take 100 mg by mouth at bedtime.    Cholecalciferol (VITAMIN D3) 5000 UNITS CAPS Take 1 capsule (5,000 Units total) by mouth daily. Qty: 30 capsule, Refills: 5    Associated Diagnoses: Vitamin D deficiency    potassium chloride SA (K-DUR,KLOR-CON) 20 MEQ tablet Take 1 tablet (20 mEq total) by mouth daily. Qty: 30 tablet, Refills: 1       Allergies  Allergen Reactions  . Ultram [Tramadol] Other (See Comments)    seizures  . Morphine And Related Hives, Rash and Other (See Comments)    shaking    Microbiology: No results found for this or any previous visit (from the past 240 hour(s)).   Labs: Basic Metabolic Panel:  Recent Labs Lab 08/29/14 1120  NA 140  K 2.8*  CL 112*  CO2 20*  GLUCOSE 81  BUN 9  CREATININE 0.50  CALCIUM 9.0   Liver Function Tests: No results for input(s): AST, ALT, ALKPHOS, BILITOT, PROT, ALBUMIN in the last 168 hours. No results for input(s): LIPASE, AMYLASE in the last 168 hours. No results for input(s): AMMONIA in the last 168 hours. CBC: No results for input(s): WBC, NEUTROABS, HGB, HCT, MCV, PLT in the last 168 hours. Cardiac Enzymes: No results for input(s): CKTOTAL, CKMB, CKMBINDEX, TROPONINI in the last 168 hours. BNP: BNP (last 3 results)  Recent Labs  03/18/14 1811 04/29/14 1459  BNP 78.1 114.5*    ProBNP (last 3 results) No results for input(s): PROBNP in the last 8760 hours.  CBG: No results for input(s): GLUCAP in the last 168 hours.  Signed:  Jeanann LewandowskyJEGEDE, Arvetta Araque MD

## 2014-08-31 ENCOUNTER — Telehealth (HOSPITAL_COMMUNITY): Payer: Self-pay | Admitting: Internal Medicine

## 2014-08-31 NOTE — Telephone Encounter (Signed)
Notified pt that, per Dr. Mikeal HawthorneGarba, she will need to go the ED for evaluation; pt had stated earlier that she went to the ED on 08/30/14 but did not stay for treatment because there were too many people in the waiting area; pt verbalized understanding

## 2014-08-31 NOTE — Telephone Encounter (Signed)
Pt states having pain in legs, hips, and back; rates pain 8/10; pt states taken percocet and aleve at home with no relief; MD notified

## 2014-09-07 ENCOUNTER — Ambulatory Visit: Payer: Medicaid Other | Admitting: Family Medicine

## 2014-09-10 ENCOUNTER — Telehealth: Payer: Self-pay | Admitting: Family Medicine

## 2014-09-10 ENCOUNTER — Other Ambulatory Visit: Payer: Self-pay | Admitting: Family Medicine

## 2014-09-10 DIAGNOSIS — Z79891 Long term (current) use of opiate analgesic: Secondary | ICD-10-CM

## 2014-09-10 MED ORDER — OXYCODONE-ACETAMINOPHEN 10-325 MG PO TABS: 1.0000 | ORAL_TABLET | Freq: Four times a day (QID) | ORAL | Status: DC | PRN

## 2014-09-10 NOTE — Telephone Encounter (Signed)
Refill request for Percocet 10/325mg . LOV 08/24/2014. Please advise. Thanks!

## 2014-09-10 NOTE — Telephone Encounter (Signed)
Needs office visit.

## 2014-09-10 NOTE — Telephone Encounter (Signed)
Received medical records request from [employer]. Forwarded request to [employer]. ° °

## 2014-09-11 ENCOUNTER — Other Ambulatory Visit: Payer: Self-pay | Admitting: Family Medicine

## 2014-09-11 DIAGNOSIS — Z79891 Long term (current) use of opiate analgesic: Secondary | ICD-10-CM

## 2014-09-11 MED ORDER — OXYCODONE-ACETAMINOPHEN 10-325 MG PO TABS: 1.0000 | ORAL_TABLET | Freq: Four times a day (QID) | ORAL | Status: DC | PRN

## 2014-09-14 ENCOUNTER — Telehealth (HOSPITAL_COMMUNITY): Payer: Self-pay | Admitting: Hematology

## 2014-09-14 NOTE — Telephone Encounter (Signed)
Spoke with Dr. Mikeal HawthorneGarba.  Advised patient that per Dr. Mikeal HawthorneGarba she needs to take home medication as prescribed.  If no improvement, patient can go to ED.  Patient verbalizes understanding.

## 2014-09-14 NOTE — Telephone Encounter (Signed)
Patient C/O pain to legs, hip and back.  Patient rates pain 7/10 on pain scale.  Patient has taken percocet as prescribed without any relief.  Patient denies N/V/D, denies chest pain or shortness of breath.  I advised I would notify the physician and give her a call back.  Patient verbalizes understanding.

## 2014-09-17 ENCOUNTER — Telehealth (HOSPITAL_COMMUNITY): Payer: Self-pay | Admitting: Internal Medicine

## 2014-09-17 NOTE — Telephone Encounter (Signed)
Notified patient that, per MD, she needs to take home pain medications as prescribed and go to ED if she experiences SOB; pt hung up on call

## 2014-09-17 NOTE — Telephone Encounter (Signed)
Pt states experiencing pain all over; rates pain 8/10; denies CP, SOB, nausea, vomiting, or diarrhea; states took home pain medication at 3am with no relief; MD notfied

## 2014-09-21 ENCOUNTER — Other Ambulatory Visit (HOSPITAL_COMMUNITY): Payer: Self-pay | Admitting: Family Medicine

## 2014-09-21 ENCOUNTER — Telehealth (HOSPITAL_COMMUNITY): Payer: Self-pay

## 2014-09-21 ENCOUNTER — Non-Acute Institutional Stay (HOSPITAL_COMMUNITY)
Admission: AD | Admit: 2014-09-21 | Discharge: 2014-09-21 | Disposition: A | Discharge status: Home / Self Care | Payer: Medicaid Other | Attending: Internal Medicine | Admitting: Internal Medicine

## 2014-09-21 ENCOUNTER — Encounter (HOSPITAL_COMMUNITY): Payer: Self-pay

## 2014-09-21 DIAGNOSIS — R52 Pain, unspecified: Secondary | ICD-10-CM | POA: Diagnosis present

## 2014-09-21 DIAGNOSIS — Z79899 Other long term (current) drug therapy: Secondary | ICD-10-CM | POA: Insufficient documentation

## 2014-09-21 DIAGNOSIS — Z79891 Long term (current) use of opiate analgesic: Secondary | ICD-10-CM | POA: Insufficient documentation

## 2014-09-21 DIAGNOSIS — D57819 Other sickle-cell disorders with crisis, unspecified: Secondary | ICD-10-CM | POA: Diagnosis not present

## 2014-09-21 DIAGNOSIS — D5700 Hb-SS disease with crisis, unspecified: Secondary | ICD-10-CM

## 2014-09-21 DIAGNOSIS — D571 Sickle-cell disease without crisis: Secondary | ICD-10-CM | POA: Diagnosis not present

## 2014-09-21 DIAGNOSIS — Z791 Long term (current) use of non-steroidal anti-inflammatories (NSAID): Secondary | ICD-10-CM | POA: Insufficient documentation

## 2014-09-21 DIAGNOSIS — E876 Hypokalemia: Secondary | ICD-10-CM | POA: Diagnosis not present

## 2014-09-21 LAB — COMPREHENSIVE METABOLIC PANEL
ALBUMIN: 3.7 g/dL (ref 3.5–5.0)
ALK PHOS: 99 U/L (ref 38–126)
ALT: 17 U/L (ref 14–54)
AST: 27 U/L (ref 15–41)
Anion gap: 5 (ref 5–15)
BUN: 6 mg/dL (ref 6–20)
CALCIUM: 8.9 mg/dL (ref 8.9–10.3)
CHLORIDE: 111 mmol/L (ref 101–111)
CO2: 23 mmol/L (ref 22–32)
Creatinine, Ser: 0.39 mg/dL — ABNORMAL LOW (ref 0.44–1.00)
GFR calc Af Amer: >60 mL/min (ref >60)
GFR calc non Af Amer: >60 mL/min (ref >60)
GLUCOSE: 92 mg/dL (ref 65–99)
Potassium: 2.8 mmol/L — ABNORMAL LOW (ref 3.5–5.1)
Sodium: 139 mmol/L (ref 135–145)
Total Bilirubin: 1.8 mg/dL — ABNORMAL HIGH (ref 0.3–1.2)
Total Protein: 7.1 g/dL (ref 6.5–8.1)

## 2014-09-21 LAB — CBC WITH DIFFERENTIAL/PLATELET
BASOS ABS: 0.1 10*3/uL (ref 0.0–0.1)
BASOS PCT: 1 % (ref 0–1)
Eosinophils Absolute: 0.1 10*3/uL (ref 0.0–0.7)
Eosinophils Relative: 1 % (ref 0–5)
HCT: 22.9 % — ABNORMAL LOW (ref 36.0–46.0)
Hemoglobin: 8.1 g/dL — ABNORMAL LOW (ref 12.0–15.0)
Lymphocytes Relative: 31 % (ref 12–46)
Lymphs Abs: 3.5 10*3/uL (ref 0.7–4.0)
MCH: 34.2 pg — AB (ref 26.0–34.0)
MCHC: 35.4 g/dL (ref 30.0–36.0)
MCV: 96.6 fL (ref 78.0–100.0)
MONOS PCT: 11 % (ref 3–12)
Monocytes Absolute: 1.3 10*3/uL — ABNORMAL HIGH (ref 0.1–1.0)
NEUTROS ABS: 6.2 10*3/uL (ref 1.7–7.7)
Neutrophils Relative %: 56 % (ref 43–77)
Platelets: 349 10*3/uL (ref 150–400)
RBC: 2.37 MIL/uL — AB (ref 3.87–5.11)
RDW: 17.3 % — ABNORMAL HIGH (ref 11.5–15.5)
WBC: 11 10*3/uL — ABNORMAL HIGH (ref 4.0–10.5)

## 2014-09-21 LAB — RETICULOCYTES
RBC.: 2.37 MIL/uL — ABNORMAL LOW (ref 3.87–5.11)
Retic Count, Absolute: 251.2 10*3/uL — ABNORMAL HIGH (ref 19.0–186.0)
Retic Ct Pct: 10.6 % — ABNORMAL HIGH (ref 0.4–3.1)

## 2014-09-21 MED ORDER — HEPARIN SOD (PORK) LOCK FLUSH 100 UNIT/ML IV SOLN
500.0000 [IU] | INTRAVENOUS | Status: AC | PRN
  Administered 2014-09-21: 500 [IU]
  Filled 2014-09-21: qty 5

## 2014-09-21 MED ORDER — POLYETHYLENE GLYCOL 3350 17 G PO PACK: 17.0000 g | PACK | Freq: Every day | ORAL | Status: DC | PRN

## 2014-09-21 MED ORDER — SODIUM CHLORIDE 0.9 % IJ SOLN: 9.0000 mL | INTRAMUSCULAR | Status: DC | PRN

## 2014-09-21 MED ORDER — SODIUM CHLORIDE 0.9 % IV SOLN
12.5000 mg | Freq: Four times a day (QID) | INTRAVENOUS | Status: DC | PRN
  Filled 2014-09-21: qty 0.25

## 2014-09-21 MED ORDER — HYDROMORPHONE HCL 2 MG/ML IJ SOLN
1.0000 mg | Freq: Once | INTRAMUSCULAR | Status: AC
Start: 2014-09-21 — End: 2014-09-21
  Administered 2014-09-21: 1 mg via INTRAVENOUS
  Filled 2014-09-21: qty 1

## 2014-09-21 MED ORDER — FOLIC ACID 1 MG PO TABS
1.0000 mg | ORAL_TABLET | Freq: Every day | ORAL | Status: DC
  Administered 2014-09-21: 1 mg via ORAL
  Filled 2014-09-21: qty 1

## 2014-09-21 MED ORDER — DEXTROSE-NACL 5-0.45 % IV SOLN
INTRAVENOUS | Status: DC
  Administered 2014-09-21: 13:00:00 via INTRAVENOUS

## 2014-09-21 MED ORDER — NALOXONE HCL 0.4 MG/ML IJ SOLN: 0.4000 mg | INTRAMUSCULAR | Status: DC | PRN

## 2014-09-21 MED ORDER — ONDANSETRON HCL 4 MG/2ML IJ SOLN: 4.0000 mg | Freq: Four times a day (QID) | INTRAMUSCULAR | Status: DC | PRN

## 2014-09-21 MED ORDER — DIPHENHYDRAMINE HCL 12.5 MG/5ML PO ELIX
12.5000 mg | ORAL_SOLUTION | Freq: Four times a day (QID) | ORAL | Status: DC | PRN
  Filled 2014-09-21: qty 5

## 2014-09-21 MED ORDER — HYDROMORPHONE 2 MG/ML HIGH CONCENTRATION IV PCA SOLN
INTRAVENOUS | Status: DC
  Administered 2014-09-21: 6 mg via INTRAVENOUS
  Administered 2014-09-21: 14:00:00 via INTRAVENOUS
  Administered 2014-09-21: 7 mg via INTRAVENOUS
  Filled 2014-09-21 (×2): qty 25

## 2014-09-21 MED ORDER — KETOROLAC TROMETHAMINE 30 MG/ML IJ SOLN
30.0000 mg | Freq: Once | INTRAMUSCULAR | Status: AC
  Administered 2014-09-21: 30 mg via INTRAVENOUS
  Filled 2014-09-21: qty 1

## 2014-09-21 MED ORDER — SENNOSIDES-DOCUSATE SODIUM 8.6-50 MG PO TABS: 1.0000 | ORAL_TABLET | Freq: Two times a day (BID) | ORAL | Status: DC

## 2014-09-21 MED ORDER — SODIUM CHLORIDE 0.9 % IJ SOLN
10.0000 mL | INTRAMUSCULAR | Status: AC | PRN
  Administered 2014-09-21: 10 mL

## 2014-09-21 NOTE — Telephone Encounter (Signed)
Patient called complaining of pain all over 7/10. Last pain medication Norco 10 mg taken at 0600. Denies fever, chest pain, SOB, vomiting, or diarrhea. Wants to come to day hospital for treatment.

## 2014-09-21 NOTE — H&P (Signed)
[facility] History and Physical  Brittany Foster ZOX:[id number] DOB: November 04, 1985 DOA: 09/21/2014  PCP: [name],[name], MD   Chief Complaint: Pain  HPI: Brittany Foster is a 29 y.o. female opiate tolerant with Hb SS [facility] with pain in her back and legs that started about 3 days ago but getting worse in the last 24 hours not responding to oral medication. She rated her pain at about 8 out of 10 and throbbing in nature. Pain is nonradiating, no other relieving factors, aggravated by walking or activity. Last pain medication Norco  at 0600. She denies fever, no chest pain, no shortness of breath. She endorses mild nausea but no vomiting or diarrhea.  Systemic Review: General: The patient denies anorexia, fever, weight loss Cardiac: Denies chest pain, syncope, palpitations, pedal edema  Respiratory: Denies cough, shortness of breath, wheezing GI: Denies severe indigestion/heartburn, abdominal pain, nausea, vomiting, diarrhea and constipation GU: Denies hematuria, incontinence, dysuria  Musculoskeletal: Denies arthritis  Skin: Denies suspicious skin lesions Neurologic: Denies focal weakness or numbness, change in vision  Past Medical History  Diagnosis Date  . Sickle cell anemia     Past Surgical History  Procedure Laterality Date  . Tubal ligation    . Cholecystectomy    . Cesarean section    . Port a cath placement Right     about 6-7 years ago  . Cholecystectomy  2000    Allergies  Allergen Reactions  . Ultram [Tramadol] Other (See Comments)    seizures  . Morphine And Related Hives, Rash and Other (See Comments)    shaking    Family History  Problem Relation Age of Onset  . Sickle cell anemia Other   . Sickle cell trait Father   . Sickle cell trait Mother       Prior to Admission medications   Medication Sig Start Date End Date Taking? Authorizing Provider  DULoxetine (CYMBALTA) 30 MG capsule Take 30 mg by mouth daily.    Yes Historical Provider, MD  folic acid (FOLVITE) 1 MG tablet Take 1 tablet (1 mg total) by mouth daily. 02/16/14  Yes [name], FNP  gabapentin (NEURONTIN) 300 MG capsule Take 1 capsule (300 mg total) by mouth 3 (three) times daily. 02/14/14  Yes Altha Harm, MD  HYDROcodone-acetaminophen (NORCO) 10-325 MG per tablet Take 1 tablet by mouth 3 (three) times daily as needed.   Yes Historical Provider, MD  hydroxyurea (HYDREA) 500 MG capsule Take 2 capsules (1,000 mg total) by mouth daily. May take with food to minimize GI side effects. 08/07/14  Yes [name], FNP  Naproxen Sodium (ALEVE) 220 MG CAPS Take 660 mg by mouth every 6 (six) hours as needed (pain).   Yes Historical Provider, MD  Topiramate ER 100 MG CP24 Take 100 mg by mouth at bedtime.   Yes Historical Provider, MD  Cholecalciferol (VITAMIN D3) 5000 UNITS CAPS Take 1 capsule (5,000 Units total) by mouth daily. 08/07/14   [name], FNP  oxyCODONE-acetaminophen (PERCOCET) 10-325 MG per tablet Take 1 tablet by mouth every 6 (six) hours as needed for pain. 09/11/14   Henrietta Hoover, NP  potassium chloride SA (K-DUR,KLOR-CON) 20 MEQ tablet Take 1 tablet (20 mEq total) by mouth daily. 07/03/14   Altha Harm, MD    Physical Exam: Filed Vitals:   09/21/14 1525 09/21/14 1625 09/21/14 1725 09/21/14 1827  BP: 97/57 102/58 96/52 106/64  Pulse: 68 73 79 77  Temp:  97.9 F (36.6 C) 98.2 F (36.8 C) 98 F (36.7 C) 97.8 F (36.6 C)  TempSrc: Oral Oral Oral Oral  Resp: 17 15 12 14   Height:      Weight:      SpO2: 98% 99% 98% 100%   General: Alert, awake, afebrile, anicteric, not in obvious distress HEENT: Normocephalic and Atraumatic, Mucous membranes pink                PERRLA; EOM intact; No scleral icterus,                 Nares: Patent, Oropharynx: Clear, Fair Dentition                 Neck: FROM, no cervical lymphadenopathy, thyromegaly, carotid bruit or JVD;  Breasts: deferred CHEST WALL: No  tenderness  CHEST: Normal respiration, clear to auscultation bilaterally  HEART: Regular rate and rhythm; no murmurs rubs or gallops  BACK: No kyphosis or scoliosis; no CVA tenderness  ABDOMEN: Positive Bowel Sounds, soft, non-tender; no masses, no organomegaly Rectal Exam: deferred EXTREMITIES: No cyanosis, clubbing, or edema Genitalia: not examined  SKIN:  no rash or ulceration  CNS: Alert and Oriented x 4, Nonfocal exam, CN 2-12 intact  Labs on Admission:  Basic Metabolic Panel:  Recent Labs Lab 09/21/14 1240  NA 139  K 2.8*  CL 111  CO2 23  GLUCOSE 92  BUN 6  CREATININE 0.39*  CALCIUM 8.9   Liver Function Tests:  Recent Labs Lab 09/21/14 1240  AST 27  ALT 17  ALKPHOS 99  BILITOT 1.8*  PROT 7.1  ALBUMIN 3.7   No results for input(s): LIPASE, AMYLASE in the last 168 hours. No results for input(s): AMMONIA in the last 168 hours. CBC:  Recent Labs Lab 09/21/14 1240 09/24/14 1115  WBC 11.0* 11.9*  NEUTROABS 6.2 7.2  HGB 8.1* 8.0*  HCT 22.9* 23.1*  MCV 96.6 98.7  PLT 349 305   Cardiac Enzymes: No results for input(s): CKTOTAL, CKMB, CKMBINDEX, TROPONINI in the last 168 hours.  BNP (last 3 results)  Recent Labs  03/18/14 1811 04/29/14 1459  BNP 78.1 114.5*    ProBNP (last 3 results) No results for input(s): PROBNP in the last 8760 hours.  CBG: No results for input(s): GLUCAP in the last 168 hours.   Assessment/Plan Active Problems:   * No active hospital problems. *    Admits to the Day Hospital  IVF D5 .45% Saline @ 125 mls/hour  Weight based Dilaudid PCA  IV Toradol  Monitor vitals very closely  Patient will be re-evaluated for pain in the context of function and relationship to baseline as care progresses.  If no significant relieve from pain (remains above 5/10) will transfer patient to inpatient services for further evaluation and management  Code Status: Full Family Communication: None DVT Prophylaxis: Ambulate as  tolerated   Time spent: 45 minutes  Holly Pring, MD, MHA, FACP, CPE  If 7PM-7AM, please contact night-coverage [email or web address]

## 2014-09-21 NOTE — Telephone Encounter (Signed)
Returned patient phone call told her she could come to the day hospital. Patient states she is looking for a ride. Asked patient to call me back when she got a ride to ensure we still had space to accommodate her. Patient verbalizes understanding.

## 2014-09-21 NOTE — Progress Notes (Signed)
Patient treated at the day hospital. Pain 6/10 at discharge. Discharge instructions given to patient. Patient alert, oriented, and ambulatory at discharge.

## 2014-09-24 ENCOUNTER — Non-Acute Institutional Stay (HOSPITAL_COMMUNITY)
Admission: AD | Admit: 2014-09-24 | Discharge: 2014-09-24 | Disposition: A | Discharge status: Home / Self Care | Payer: Medicaid Other | Attending: Internal Medicine | Admitting: Internal Medicine

## 2014-09-24 ENCOUNTER — Telehealth: Payer: Self-pay | Admitting: Hematology

## 2014-09-24 ENCOUNTER — Ambulatory Visit: Payer: Medicaid Other | Admitting: Family Medicine

## 2014-09-24 ENCOUNTER — Encounter (HOSPITAL_COMMUNITY): Payer: Self-pay

## 2014-09-24 DIAGNOSIS — D5700 Hb-SS disease with crisis, unspecified: Secondary | ICD-10-CM

## 2014-09-24 DIAGNOSIS — Z791 Long term (current) use of non-steroidal anti-inflammatories (NSAID): Secondary | ICD-10-CM | POA: Insufficient documentation

## 2014-09-24 DIAGNOSIS — D57819 Other sickle-cell disorders with crisis, unspecified: Secondary | ICD-10-CM | POA: Diagnosis not present

## 2014-09-24 DIAGNOSIS — Z79891 Long term (current) use of opiate analgesic: Secondary | ICD-10-CM | POA: Diagnosis not present

## 2014-09-24 DIAGNOSIS — E876 Hypokalemia: Secondary | ICD-10-CM | POA: Insufficient documentation

## 2014-09-24 DIAGNOSIS — D571 Sickle-cell disease without crisis: Secondary | ICD-10-CM | POA: Insufficient documentation

## 2014-09-24 DIAGNOSIS — R52 Pain, unspecified: Secondary | ICD-10-CM | POA: Diagnosis present

## 2014-09-24 DIAGNOSIS — Z79899 Other long term (current) drug therapy: Secondary | ICD-10-CM | POA: Insufficient documentation

## 2014-09-24 LAB — CBC WITH DIFFERENTIAL/PLATELET
BASOS ABS: 0.1 10*3/uL (ref 0.0–0.1)
BASOS PCT: 1 % (ref 0–1)
Eosinophils Absolute: 0.1 10*3/uL (ref 0.0–0.7)
Eosinophils Relative: 0 % (ref 0–5)
HCT: 23.1 % — ABNORMAL LOW (ref 36.0–46.0)
Hemoglobin: 8 g/dL — ABNORMAL LOW (ref 12.0–15.0)
Lymphocytes Relative: 27 % (ref 12–46)
Lymphs Abs: 3.2 10*3/uL (ref 0.7–4.0)
MCH: 34.2 pg — ABNORMAL HIGH (ref 26.0–34.0)
MCHC: 34.6 g/dL (ref 30.0–36.0)
MCV: 98.7 fL (ref 78.0–100.0)
MONO ABS: 1.5 10*3/uL — AB (ref 0.1–1.0)
MONOS PCT: 12 % (ref 3–12)
NEUTROS ABS: 7.2 10*3/uL (ref 1.7–7.7)
Neutrophils Relative %: 60 % (ref 43–77)
PLATELETS: 305 10*3/uL (ref 150–400)
RBC: 2.34 MIL/uL — AB (ref 3.87–5.11)
RDW: 19.2 % — ABNORMAL HIGH (ref 11.5–15.5)
WBC: 11.9 10*3/uL — AB (ref 4.0–10.5)

## 2014-09-24 LAB — BASIC METABOLIC PANEL
ANION GAP: 5 (ref 5–15)
BUN: 6 mg/dL (ref 6–20)
CO2: 23 mmol/L (ref 22–32)
Calcium: 8.7 mg/dL — ABNORMAL LOW (ref 8.9–10.3)
Chloride: 112 mmol/L — ABNORMAL HIGH (ref 101–111)
Creatinine, Ser: 0.41 mg/dL — ABNORMAL LOW (ref 0.44–1.00)
GFR calc Af Amer: >60 mL/min (ref >60)
GFR calc non Af Amer: >60 mL/min (ref >60)
Glucose, Bld: 97 mg/dL (ref 65–99)
Potassium: 3.1 mmol/L — ABNORMAL LOW (ref 3.5–5.1)
SODIUM: 140 mmol/L (ref 135–145)

## 2014-09-24 MED ORDER — POLYETHYLENE GLYCOL 3350 17 G PO PACK: 17.0000 g | PACK | Freq: Every day | ORAL | Status: DC | PRN

## 2014-09-24 MED ORDER — SODIUM CHLORIDE 0.9 % IJ SOLN: 10.0000 mL | INTRAMUSCULAR | Status: DC | PRN

## 2014-09-24 MED ORDER — HEPARIN SOD (PORK) LOCK FLUSH 100 UNIT/ML IV SOLN
500.0000 [IU] | INTRAVENOUS | Status: DC | PRN
  Filled 2014-09-24: qty 5

## 2014-09-24 MED ORDER — SODIUM CHLORIDE 0.9 % IJ SOLN: 9.0000 mL | INTRAMUSCULAR | Status: DC | PRN

## 2014-09-24 MED ORDER — KETOROLAC TROMETHAMINE 30 MG/ML IJ SOLN
30.0000 mg | Freq: Once | INTRAMUSCULAR | Status: AC
  Administered 2014-09-24: 30 mg via INTRAVENOUS
  Filled 2014-09-24: qty 1

## 2014-09-24 MED ORDER — SENNOSIDES-DOCUSATE SODIUM 8.6-50 MG PO TABS: 1.0000 | ORAL_TABLET | Freq: Two times a day (BID) | ORAL | Status: DC

## 2014-09-24 MED ORDER — POTASSIUM CHLORIDE CRYS ER 20 MEQ PO TBCR
20.0000 meq | EXTENDED_RELEASE_TABLET | Freq: Once | ORAL | Status: AC
  Administered 2014-09-24: 20 meq via ORAL
  Filled 2014-09-24: qty 1

## 2014-09-24 MED ORDER — DEXTROSE-NACL 5-0.45 % IV SOLN
INTRAVENOUS | Status: DC
  Administered 2014-09-24: 11:00:00 via INTRAVENOUS

## 2014-09-24 MED ORDER — HYDROMORPHONE 2 MG/ML HIGH CONCENTRATION IV PCA SOLN
INTRAVENOUS | Status: DC
  Administered 2014-09-24: 6.5 mg via INTRAVENOUS
  Administered 2014-09-24: 7 mg via INTRAVENOUS
  Administered 2014-09-24: 12:00:00 via INTRAVENOUS
  Administered 2014-09-24: 4 mg via INTRAVENOUS
  Filled 2014-09-24 (×2): qty 25

## 2014-09-24 MED ORDER — ONDANSETRON HCL 4 MG/2ML IJ SOLN: 4.0000 mg | Freq: Four times a day (QID) | INTRAMUSCULAR | Status: DC | PRN

## 2014-09-24 MED ORDER — DIPHENHYDRAMINE HCL 12.5 MG/5ML PO ELIX
12.5000 mg | ORAL_SOLUTION | Freq: Four times a day (QID) | ORAL | Status: DC | PRN
Start: 2014-09-24 — End: 2014-09-24

## 2014-09-24 MED ORDER — SODIUM CHLORIDE 0.9 % IV SOLN
12.5000 mg | Freq: Four times a day (QID) | INTRAVENOUS | Status: DC | PRN
  Administered 2014-09-24: 12.5 mg via INTRAVENOUS
  Filled 2014-09-24 (×3): qty 0.25

## 2014-09-24 MED ORDER — NALOXONE HCL 0.4 MG/ML IJ SOLN: 0.4000 mg | INTRAMUSCULAR | Status: DC | PRN

## 2014-09-24 MED ORDER — FOLIC ACID 1 MG PO TABS
1.0000 mg | ORAL_TABLET | Freq: Every day | ORAL | Status: DC
  Administered 2014-09-24: 1 mg via ORAL
  Filled 2014-09-24: qty 1

## 2014-09-24 NOTE — Telephone Encounter (Signed)
Patient called C/O pain to back /10.  Patient denies fever,chest pain, nausea/vomiting, or diarrhea.  Patient states she was seen in day hospital on Friday and would like to come back today.  I called and spoke with Dr. Hyman Hopes, and he advised patient needs to come to primary office for appointment to evaluated medication management.  Advised patient, appointment today for 11:30.  Patient verbalizes understanding.

## 2014-09-24 NOTE — H&P (Signed)
[facility] History and Physical  Brittany Foster ZOX:[id number] DOB: 11-29-1985 DOA: 09/24/2014  PCP: [name],[name], MD    Chief Complaint: Pain  HPI: Brittany Foster is a 29 y.o. female opiate tolerant with Hb SS was last seen here on Friday, 09/21/2014 with similar complaints presents to the [facility] today with pain in her back and legs that has been ongoing since discharge from the day hospital but getting worse in the last 24 hours not responding to oral medication. Patient recently changed pain management clinic and her medications were adjusted to the current doses, she complains that her current medications are not effective in the management of her pain. She rated her pain at about 10 out of 10 and throbbing in nature. Pain is nonradiating, no other relieving factors, aggravated by walking or activity. She denies fever, no chest pain, no shortness of breath. She endorses mild nausea but no vomiting or diarrhea.  Systemic review General: The patient denies anorexia, fever, weight loss Cardiac: Denies chest pain, syncope, palpitations, pedal edema  Respiratory: Denies cough, shortness of breath, wheezing GI: Denies severe indigestion/heartburn, abdominal pain, nausea, vomiting, diarrhea and constipation GU: Denies hematuria, incontinence, dysuria  Musculoskeletal: Denies arthritis  Skin: Denies suspicious skin lesions Neurologic: Denies focal weakness or numbness, change in vision  Past Medical History  Diagnosis Date  . Sickle cell anemia     Past Surgical History  Procedure Laterality Date  . Tubal ligation    . Cholecystectomy    . Cesarean section    . Port a cath placement Right     about 6-7 years ago  . Cholecystectomy  2000    Allergies  Allergen Reactions  . Ultram [Tramadol] Other (See Comments)    seizures  . Morphine And Related Hives, Rash and Other (See Comments)    shaking    Family History  Problem Relation Age of Onset  .  Sickle cell anemia Other   . Sickle cell trait Father   . Sickle cell trait Mother       Prior to Admission medications   Medication Sig Start Date End Date Taking? Authorizing Provider  DULoxetine (CYMBALTA) 30 MG capsule Take 30 mg by mouth daily.   Yes Historical Provider, MD  folic acid (FOLVITE) 1 MG tablet Take 1 tablet (1 mg total) by mouth daily. 02/16/14  Yes [name], FNP  HYDROcodone-acetaminophen (NORCO) 10-325 MG per tablet Take 1 tablet by mouth 3 (three) times daily as needed.   Yes Historical Provider, MD  hydroxyurea (HYDREA) 500 MG capsule Take 2 capsules (1,000 mg total) by mouth daily. May take with food to minimize GI side effects. 08/07/14  Yes [name], FNP  Topiramate ER 100 MG CP24 Take 100 mg by mouth at bedtime.   Yes Historical Provider, MD  Cholecalciferol (VITAMIN D3) 5000 UNITS CAPS Take 1 capsule (5,000 Units total) by mouth daily. 08/07/14   [name], FNP  gabapentin (NEURONTIN) 300 MG capsule Take 1 capsule (300 mg total) by mouth 3 (three) times daily. 02/14/14   Altha Harm, MD  Naproxen Sodium (ALEVE) 220 MG CAPS Take 660 mg by mouth every 6 (six) hours as needed (pain).    Historical Provider, MD  oxyCODONE-acetaminophen (PERCOCET) 10-325 MG per tablet Take 1 tablet by mouth every 6 (six) hours as needed for pain. 09/11/14   Henrietta Hoover, NP  potassium chloride SA (K-DUR,KLOR-CON) 20 MEQ tablet Take 1 tablet (20 mEq total) by mouth  daily. 07/03/14   Altha Harm, MD     Physical Exam: Filed Vitals:   09/24/14 1056 09/24/14 1153 09/24/14 1200 09/24/14 1210  BP: 106/69  102/58 101/57  Pulse: 74  62 67  Temp: 98.3 F (36.8 C)  98 F (36.7 C) 98 F (36.7 C)  TempSrc: Oral  Oral Oral  Resp: 18 16 17 14   Height: 5' (1.524 m)     Weight: 112 lb (50.803 kg)     SpO2: 100% 100% 99% 99%     General: Alert, awake, afebrile, anicteric, not in obvious distress HEENT: Normocephalic and Atraumatic, Mucous membranes  pink                PERRLA; EOM intact; No scleral icterus,                 Nares: Patent, Oropharynx: Clear, Fair Dentition                 Neck: FROM, no cervical lymphadenopathy, thyromegaly, carotid bruit or JVD;  Breasts: deferred CHEST WALL: No tenderness  CHEST: Normal respiration, clear to auscultation bilaterally  HEART: Regular rate and rhythm; no murmurs rubs or gallops  BACK: No kyphosis or scoliosis; no CVA tenderness  ABDOMEN: Positive Bowel Sounds, soft, non-tender; no masses, no organomegaly Rectal Exam: deferred EXTREMITIES: No cyanosis, clubbing, or edema Genitalia: not examined  SKIN:  no rash or ulceration  CNS: Alert and Oriented x 4, Nonfocal exam, CN 2-12 intact  Labs on Admission:  Basic Metabolic Panel:  Recent Labs Lab 09/21/14 1240  NA 139  K 2.8*  CL 111  CO2 23  GLUCOSE 92  BUN 6  CREATININE 0.39*  CALCIUM 8.9   Liver Function Tests:  Recent Labs Lab 09/21/14 1240  AST 27  ALT 17  ALKPHOS 99  BILITOT 1.8*  PROT 7.1  ALBUMIN 3.7   No results for input(s): LIPASE, AMYLASE in the last 168 hours. No results for input(s): AMMONIA in the last 168 hours. CBC:  Recent Labs Lab 09/21/14 1240 09/24/14 1115  WBC 11.0* 11.9*  NEUTROABS 6.2 7.2  HGB 8.1* 8.0*  HCT 22.9* 23.1*  MCV 96.6 98.7  PLT 349 305   Cardiac Enzymes: No results for input(s): CKTOTAL, CKMB, CKMBINDEX, TROPONINI in the last 168 hours.  BNP (last 3 results)  Recent Labs  03/18/14 1811 04/29/14 1459  BNP 78.1 114.5*    ProBNP (last 3 results) No results for input(s): PROBNP in the last 8760 hours.  CBG: No results for input(s): GLUCAP in the last 168 hours.   Assessment/Plan Active Problems:   Sickle-cell disease with pain Hypokalemia  Admits to the Day Hospital  IVF D5 .45% Saline @ 125 mls/hour  Weight based Dilaudid PCA  IV Toradol  Oral potassium, check BMP  Monitor vitals very closely  Patient will be re-evaluated for pain in the  context of function and relationship to baseline as care progresses.  If no significant relieve from pain (remains above 5/10) will transfer patient to inpatient services for further evaluation and management  Code Status: Full Family Communication: None DVT Prophylaxis: Ambulate as tolerated  Time spent: 45 minutes  Samuele Storey, MD, MHA, FACP, CPE   If 7PM-7AM, please contact night-coverage [email or web address] 09/24/2014, 1:03 PM

## 2014-09-24 NOTE — Telephone Encounter (Signed)
Patient called back in to say that she is being followed by a pain clinic in [place] for her pain medication.  Patient states she has called them this morning and is waiting on a call back.  I called and spoke with Dr. Hyman Hopes, and he advises patient can come to day hospital, but she needs to be seen by the pain clinic ASAP to have medications adjusted.  Patient notified and will see about transportation to day hospital.

## 2014-09-24 NOTE — Progress Notes (Signed)
Patient seen in day hospital today for treatment. Pain 8/10 at discharge. Port accessed, blood return noted, de accessed per protocol. Discharge instructions given. Patient alert, oriented and ambulatory at discharge.

## 2014-09-24 NOTE — Discharge Summary (Signed)
Physician Discharge Summary  Brittany Foster ZOX:[id number] DOB: 08-14-1985 DOA: 09/21/2014  PCP: [name],[name], MD  Admit date: 09/21/2014 Discharge date: 09/21/2014  Time spent: 30 minutes  Recommendations for Outpatient Follow-up:  1. Repeat serum potassium 2. Check CBC and differential 3.  Discharge Diagnoses:  Active Problems:  Sickle cell anemia with pain  Hypokalemia  Discharge Condition: Stable  Diet recommendation: Regular  Filed Weights   09/21/14 1139  Weight: 112 lb (50.803 kg)    History of present illness:   Brittany Foster is a 29 y.o. female opiate tolerant with Hb SS presents to the Day Hospital with pain in her back and legs that started about 3 days ago but getting worse in the last 24 hours not responding to oral medication. She rated her pain at about 8 out of 10 and throbbing in nature. Pain is nonradiating, no other relieving factors, aggravated by walking or activity. Last pain medication Norco 10mg  at 0600. She denies fever, no chest pain, no shortness of breath. She endorses mild nausea but no vomiting or diarrhea.  Hospital Course:  Patient was treated with weight based Dilaudid PCA, IV fluid and IV Toradol. The patient's pain decreased to about 5 out of 10, her function was at baseline at the time of discharge. Patient was discharged home in stable condition. She was given specific instruction on her potassium tablets and her home pain medication. Return precautions were discussed and patient verbalized understanding.  Discharge Exam: Filed Vitals:   09/21/14 1827  BP: 106/64  Pulse: 77  Temp: 97.8 F (36.6 C)  Resp: 14    Discharge Instructions   Discharge Medication List as of 09/21/2014  6:26 PM    CONTINUE these medications which have NOT CHANGED   Details  DULoxetine (CYMBALTA) 30 MG capsule Take 30 mg by mouth daily., Until Discontinued, Historical Med    folic acid (FOLVITE) 1 MG tablet Take 1 tablet (1 mg total) by mouth daily.,  Starting 02/16/2014, Until Discontinued, Normal    gabapentin (NEURONTIN) 300 MG capsule Take 1 capsule (300 mg total) by mouth 3 (three) times daily., Starting 02/14/2014, Until Discontinued, Normal    hydroxyurea (HYDREA) 500 MG capsule Take 2 capsules (1,000 mg total) by mouth daily. May take with food to minimize GI side effects., Starting 08/07/2014, Until Discontinued, Normal    Naproxen Sodium (ALEVE) 220 MG CAPS Take 660 mg by mouth every 6 (six) hours as needed (pain)., Until Discontinued, Historical Med    Topiramate ER 100 MG CP24 Take 100 mg by mouth at bedtime., Until Discontinued, Historical Med    Cholecalciferol (VITAMIN D3) 5000 UNITS CAPS Take 1 capsule (5,000 Units total) by mouth daily., Starting 08/07/2014, Until Discontinued, Normal    HYDROcodone-acetaminophen (NORCO) 10-325 MG per tablet Take 1 tablet by mouth 3 (three) times daily as needed., Until Discontinued, Historical Med    oxyCODONE-acetaminophen (PERCOCET) 10-325 MG per tablet Take 1 tablet by mouth every 6 (six) hours as needed for pain., Starting 09/11/2014, Until Discontinued, Print    potassium chloride SA (K-DUR,KLOR-CON) 20 MEQ tablet Take 1 tablet (20 mEq total) by mouth daily., Starting 07/03/2014, Until Discontinued, Normal       Allergies  Allergen Reactions  . Ultram [Tramadol] Other (See Comments)    seizures  . Morphine And Related Hives, Rash and Other (See Comments)    shaking      The results of significant diagnostics from this hospitalization (including imaging, microbiology, ancillary and laboratory) are listed below for reference.  Significant Diagnostic Studies: No results found.  Microbiology: No results found for this or any previous visit (from the past 240 hour(s)).   Labs: Basic Metabolic Panel:  Recent Labs Lab 09/21/14 1240 09/24/14 1115  NA 139 140  K 2.8* 3.1*  CL 111 112*  CO2 23 23  GLUCOSE 92 97  BUN 6 6  CREATININE 0.39* 0.41*  CALCIUM 8.9 8.7*   Liver  Function Tests:  Recent Labs Lab 09/21/14 1240  AST 27  ALT 17  ALKPHOS 99  BILITOT 1.8*  PROT 7.1  ALBUMIN 3.7   No results for input(s): LIPASE, AMYLASE in the last 168 hours. No results for input(s): AMMONIA in the last 168 hours. CBC:  Recent Labs Lab 09/21/14 1240 09/24/14 1115  WBC 11.0* 11.9*  NEUTROABS 6.2 7.2  HGB 8.1* 8.0*  HCT 22.9* 23.1*  MCV 96.6 98.7  PLT 349 305   Cardiac Enzymes: No results for input(s): CKTOTAL, CKMB, CKMBINDEX, TROPONINI in the last 168 hours. BNP: BNP (last 3 results)  Recent Labs  03/18/14 1811 04/29/14 1459  BNP 78.1 114.5*   ProBNP (last 3 results) No results for input(s): PROBNP in the last 8760 hours.  CBG: No results for input(s): GLUCAP in the last 168 hours.  Signed:  Jeanann Lewandowsky  [facility]

## 2014-10-01 NOTE — Discharge Summary (Signed)
Physician Discharge Summary  Brittany Foster ZOX:[id number] DOB: [birth year] DOA: 09/24/2014  PCP: [name],[name], MD  Admit date: 09/24/2014 Discharge date: 09/24/2014  Time spent: 30 minutes   Discharge Diagnoses:  Principal Problem:   Sickle-cell disease with pain   Discharge Condition: Stable Diet recommendation: Regular  Filed Weights   09/24/14 1056  Weight: 112 lb (50.803 kg)    History of present illness:  Brittany Foster is a 29 y.o. female opiate tolerant with Hb SS was last seen here on Friday, 09/21/2014 with similar complaints presents to the [facility] today with pain in her back and legs that has been ongoing since discharge from the day hospital but getting worse in the last 24 hours not responding to oral medication. Patient recently changed pain management clinic and her medications were adjusted to the current doses, she complains that her current medications are not effective in the management of her pain. She rated her pain at about 10 out of 10 and throbbing in nature. Pain is nonradiating, no other relieving factors, aggravated by walking or activity. She denies fever, no chest pain, no shortness of breath. She endorses mild nausea but no vomiting or diarrhea.  Hospital Course:  Patient was again admitted to the day hospital with sickle cell painful crisis. She was treated with IV Dilaudid PCA, Toradol as well as IV fluids. She improved symptomatically and her pain went down from 8 out of 10 down to 2 out of 10 at the time of discharge. She is to follow-up in the clinic as previously scheduled. And she is to continue with her home medications as per prior to admission.  Discharge Exam: Filed Vitals:   09/24/14 1754  BP: 110/60  Pulse: 65  Temp: 98 F (36.7 C)  Resp: 12    General appearance: alert, cooperative and no distress Eyes: conjunctivae/corneas clear. PERRL, EOM's intact. Fundi benign. Neck: no adenopathy, no carotid bruit, no JVD, supple,  symmetrical, trachea midline and thyroid not enlarged, symmetric, no tenderness/mass/nodules Back: symmetric, no curvature. ROM normal. No CVA tenderness. Resp: clear to auscultation bilaterally Chest wall: no tenderness Cardio: regular rate and rhythm, S1, S2 normal, no murmur, click, rub or gallop GI: soft, non-tender; bowel sounds normal; no masses, no organomegaly Extremities: extremities normal, atraumatic, no cyanosis or edema Pulses: 2+ and symmetric Skin: Skin color, texture, turgor normal. No rashes or lesions Neurologic: Grossly normal  Discharge Instructions  Discharge Medication List as of 09/24/2014  5:36 PM    CONTINUE these medications which have NOT CHANGED   Details  DULoxetine (CYMBALTA) 30 MG capsule Take 30 mg by mouth daily., Until Discontinued, Historical Med    folic acid (FOLVITE) 1 MG tablet Take 1 tablet (1 mg total) by mouth daily., Starting 02/16/2014, Until Discontinued, Normal    HYDROcodone-acetaminophen (NORCO) 10-325 MG per tablet Take 1 tablet by mouth 3 (three) times daily as needed., Until Discontinued, Historical Med    hydroxyurea (HYDREA) 500 MG capsule Take 2 capsules (1,000 mg total) by mouth daily. May take with food to minimize GI side effects., Starting 08/07/2014, Until Discontinued, Normal    Topiramate ER 100 MG CP24 Take 100 mg by mouth at bedtime., Until Discontinued, Historical Med    Cholecalciferol (VITAMIN D3) 5000 UNITS CAPS Take 1 capsule (5,000 Units total) by mouth daily., Starting 08/07/2014, Until Discontinued, Normal    gabapentin (NEURONTIN) 300 MG capsule Take 1 capsule (300 mg total) by mouth 3 (three) times daily., Starting 02/14/2014, Until Discontinued, Normal  Naproxen Sodium (ALEVE) 220 MG CAPS Take 660 mg by mouth every 6 (six) hours as needed (pain)., Until Discontinued, Historical Med    oxyCODONE-acetaminophen (PERCOCET) 10-325 MG per tablet Take 1 tablet by mouth every 6 (six) hours as needed for pain., Starting  09/11/2014, Until Discontinued, Print    potassium chloride SA (K-DUR,KLOR-CON) 20 MEQ tablet Take 1 tablet (20 mEq total) by mouth daily., Starting 07/03/2014, Until Discontinued, Normal       Allergies  Allergen Reactions  . Ultram [Tramadol] Other (See Comments)    seizures  . Morphine And Related Hives, Rash and Other (See Comments)    shaking      The results of significant diagnostics from this hospitalization (including imaging, microbiology, ancillary and laboratory) are listed below for reference.    Significant Diagnostic Studies: No results found.  Microbiology: No results found for this or any previous visit (from the past 240 hour(s)).   Labs: Basic Metabolic Panel:  Recent Labs Lab 09/24/14 1115  NA 140  K 3.1*  CL 112*  CO2 23  GLUCOSE 97  BUN 6  CREATININE 0.41*  CALCIUM 8.7*   Liver Function Tests: No results for input(s): AST, ALT, ALKPHOS, BILITOT, PROT, ALBUMIN in the last 168 hours. No results for input(s): LIPASE, AMYLASE in the last 168 hours. No results for input(s): AMMONIA in the last 168 hours. CBC:  Recent Labs Lab 09/24/14 1115  WBC 11.9*  NEUTROABS 7.2  HGB 8.0*  HCT 23.1*  MCV 98.7  PLT 305   Cardiac Enzymes: No results for input(s): CKTOTAL, CKMB, CKMBINDEX, TROPONINI in the last 168 hours. BNP: BNP (last 3 results)  Recent Labs  03/18/14 1811 04/29/14 1459  BNP 78.1 114.5*    ProBNP (last 3 results) No results for input(s): PROBNP in the last 8760 hours.  CBG: No results for input(s): GLUCAP in the last 168 hours.  Signed:  Jeanann Lewandowsky MD, MHA, FACP, CPE   10/01/2014, 10:14 AM

## 2014-10-11 ENCOUNTER — Telehealth (HOSPITAL_COMMUNITY): Payer: Self-pay

## 2014-10-11 ENCOUNTER — Non-Acute Institutional Stay (HOSPITAL_COMMUNITY)
Admission: AD | Admit: 2014-10-11 | Discharge: 2014-10-11 | Disposition: A | Discharge status: Home / Self Care | Payer: Medicaid Other | Source: Ambulatory Visit | Attending: Internal Medicine | Admitting: Internal Medicine

## 2014-10-11 ENCOUNTER — Encounter (HOSPITAL_COMMUNITY): Payer: Self-pay

## 2014-10-11 DIAGNOSIS — E876 Hypokalemia: Secondary | ICD-10-CM | POA: Diagnosis not present

## 2014-10-11 DIAGNOSIS — D57819 Other sickle-cell disorders with crisis, unspecified: Secondary | ICD-10-CM

## 2014-10-11 DIAGNOSIS — Z885 Allergy status to narcotic agent status: Secondary | ICD-10-CM | POA: Diagnosis not present

## 2014-10-11 DIAGNOSIS — D5700 Hb-SS disease with crisis, unspecified: Secondary | ICD-10-CM | POA: Insufficient documentation

## 2014-10-11 DIAGNOSIS — Z832 Family history of diseases of the blood and blood-forming organs and certain disorders involving the immune mechanism: Secondary | ICD-10-CM | POA: Diagnosis not present

## 2014-10-11 DIAGNOSIS — G629 Polyneuropathy, unspecified: Secondary | ICD-10-CM | POA: Insufficient documentation

## 2014-10-11 DIAGNOSIS — R51 Headache: Secondary | ICD-10-CM | POA: Diagnosis not present

## 2014-10-11 LAB — CBC WITH DIFFERENTIAL/PLATELET
Basophils Absolute: 0 10*3/uL (ref 0.0–0.1)
Basophils Relative: 0 % (ref 0–1)
EOS ABS: 0 10*3/uL (ref 0.0–0.7)
EOS PCT: 0 % (ref 0–5)
HEMATOCRIT: 26.1 % — AB (ref 36.0–46.0)
Hemoglobin: 9.2 g/dL — ABNORMAL LOW (ref 12.0–15.0)
LYMPHS ABS: 2.5 10*3/uL (ref 0.7–4.0)
LYMPHS PCT: 21 % (ref 12–46)
MCH: 33.1 pg (ref 26.0–34.0)
MCHC: 35.2 g/dL (ref 30.0–36.0)
MCV: 93.9 fL (ref 78.0–100.0)
MONO ABS: 1.1 10*3/uL — AB (ref 0.1–1.0)
Monocytes Relative: 9 % (ref 3–12)
Neutro Abs: 8.2 10*3/uL — ABNORMAL HIGH (ref 1.7–7.7)
Neutrophils Relative %: 70 % (ref 43–77)
Platelets: 358 10*3/uL (ref 150–400)
RBC: 2.78 MIL/uL — AB (ref 3.87–5.11)
RDW: 17.7 % — ABNORMAL HIGH (ref 11.5–15.5)
WBC: 11.9 10*3/uL — AB (ref 4.0–10.5)

## 2014-10-11 LAB — BASIC METABOLIC PANEL
ANION GAP: 7 (ref 5–15)
BUN: 8 mg/dL (ref 6–20)
CALCIUM: 9 mg/dL (ref 8.9–10.3)
CO2: 19 mmol/L — ABNORMAL LOW (ref 22–32)
Chloride: 112 mmol/L — ABNORMAL HIGH (ref 101–111)
Creatinine, Ser: 0.41 mg/dL — ABNORMAL LOW (ref 0.44–1.00)
GFR calc Af Amer: >60 mL/min (ref >60)
Glucose, Bld: 105 mg/dL — ABNORMAL HIGH (ref 65–99)
POTASSIUM: 3.2 mmol/L — AB (ref 3.5–5.1)
Sodium: 138 mmol/L (ref 135–145)

## 2014-10-11 MED ORDER — KETOROLAC TROMETHAMINE 30 MG/ML IJ SOLN
30.0000 mg | Freq: Four times a day (QID) | INTRAMUSCULAR | Status: DC
  Administered 2014-10-11: 30 mg via INTRAVENOUS
  Filled 2014-10-11: qty 1

## 2014-10-11 MED ORDER — DEXTROSE-NACL 5-0.45 % IV SOLN
INTRAVENOUS | Status: DC
  Administered 2014-10-11: 12:00:00 via INTRAVENOUS

## 2014-10-11 MED ORDER — HYDROMORPHONE 2 MG/ML HIGH CONCENTRATION IV PCA SOLN
INTRAVENOUS | Status: DC
  Administered 2014-10-11: 16.12 mg via INTRAVENOUS
  Administered 2014-10-11: 12:00:00 via INTRAVENOUS
  Filled 2014-10-11: qty 25

## 2014-10-11 MED ORDER — SODIUM CHLORIDE 0.9 % IV SOLN
25.0000 mg | Freq: Four times a day (QID) | INTRAVENOUS | Status: DC | PRN
  Administered 2014-10-11: 25 mg via INTRAVENOUS
  Filled 2014-10-11 (×4): qty 0.5

## 2014-10-11 MED ORDER — ONDANSETRON HCL 4 MG/2ML IJ SOLN
4.0000 mg | Freq: Four times a day (QID) | INTRAMUSCULAR | Status: DC | PRN
  Administered 2014-10-11: 4 mg via INTRAVENOUS
  Filled 2014-10-11: qty 2

## 2014-10-11 MED ORDER — NALOXONE HCL 0.4 MG/ML IJ SOLN: 0.4000 mg | INTRAMUSCULAR | Status: DC | PRN

## 2014-10-11 MED ORDER — FOLIC ACID 1 MG PO TABS
1.0000 mg | ORAL_TABLET | Freq: Every day | ORAL | Status: DC
  Administered 2014-10-11: 1 mg via ORAL
  Filled 2014-10-11: qty 1

## 2014-10-11 MED ORDER — HEPARIN SOD (PORK) LOCK FLUSH 100 UNIT/ML IV SOLN
500.0000 [IU] | INTRAVENOUS | Status: AC | PRN
  Administered 2014-10-11: 500 [IU]
  Filled 2014-10-11: qty 5

## 2014-10-11 MED ORDER — DIPHENHYDRAMINE HCL 12.5 MG/5ML PO ELIX: 25.0000 mg | ORAL_SOLUTION | Freq: Four times a day (QID) | ORAL | Status: DC | PRN

## 2014-10-11 MED ORDER — POLYETHYLENE GLYCOL 3350 17 G PO PACK: 17.0000 g | PACK | Freq: Every day | ORAL | Status: DC | PRN

## 2014-10-11 MED ORDER — SENNOSIDES-DOCUSATE SODIUM 8.6-50 MG PO TABS: 1.0000 | ORAL_TABLET | Freq: Two times a day (BID) | ORAL | Status: DC

## 2014-10-11 MED ORDER — SODIUM CHLORIDE 0.9 % IJ SOLN: 9.0000 mL | INTRAMUSCULAR | Status: DC | PRN

## 2014-10-11 MED ORDER — SODIUM CHLORIDE 0.9 % IJ SOLN
10.0000 mL | INTRAMUSCULAR | Status: AC | PRN
  Administered 2014-10-11: 10 mL

## 2014-10-11 NOTE — Telephone Encounter (Signed)
Patient called complaining of rib pain 7/10. Last pain medication taken at 0400. Hydrocodone 7.5mg . Would like to come to day hospital for treatment. Denies fever, chest pain, SOB, Nausea vomiting or diarrhea or abdominal pain.

## 2014-10-11 NOTE — Progress Notes (Signed)
Patient discharged from day hospital pain 9/10. Patient was instructed to continue taking pain her pain meds as prescribed and to also start taking pain meds that were prescribed at the pain clinic. Patient states she needed to fill the prescription she stated she could not do it today as the place closes at 6pm. Patient instructed to get the new medication in the morning. Patient verbalizes understanding. Patient also told she could call day hospital in the morning if her pain had not improved. Patient alert, oriented and ambulatory at discharge.

## 2014-10-11 NOTE — Telephone Encounter (Signed)
After speaking with MD, Patient told to report to the day hospital for treatment. Patient states she was working on getting a ride. Patient told she needed to be here by 11am in order to benefit from treatment. Patient verbalizes understanding.

## 2014-10-11 NOTE — H&P (Signed)
[facility] History and Physical  Brittany Foster NWG:[id number] DOB: [birth year] DOA: 10/11/2014  PCP: [name],[name], MD   Chief Complaint: sickle cell crisis  HPI: Brittany Foster is a 29 y.o. female admitted for sickle cell  Crisis pain. Going on since Tuesday. Experiencing sharp pains in left lateral rib cage and hips. Was 9/10 on admission at 1:30-11:00 this morning. Usual pain is in legs stomach and back. This ribs cage pain is anomolous. Has had some nausea over the last couple of days with vomiting this morning after arrival.    General: The patient denies  fever, weight loss. Positive for loss of appetite Cardiac: Positivechest pain, syncope, palpitations, pedal edema  Respiratory: Denies cough, shortness of breath, wheezing GI: Denies severe indigestion/heartburn, abdominal pain, diarrhea and constipation. Positive for nausea and vomiting GU: Denies hematuria, incontinence, dysuria  Musculoskeletal: Positive for left lateral rib cage pain and hip and chest pain Skin: Denies suspicious skin lesions Neurologic: Denies focal weakness or numbness, change in vision. Positive for tingling in hands.  Past Medical History  Diagnosis Date  . Sickle cell anemia     Past Surgical History  Procedure Laterality Date  . Tubal ligation    . Cholecystectomy    . Cesarean section    . Port a cath placement Right     about 6-7 years ago  . Cholecystectomy  2000    Social History Li  Allergies  Allergen Reactions  . Ultram [Tramadol] Other (See Comments)    seizures  . Morphine And Related Hives, Rash and Other (See Comments)    shaking    Family History  Problem Relation Age of Onset  . Sickle cell anemia Other   . Sickle cell trait Father   . Sickle cell trait Mother       Prior to Admission medications   Medication Sig Start Date End Date Taking? Authorizing Provider  DULoxetine (CYMBALTA) 30 MG capsule Take 30 mg by mouth daily.   Yes Historical  Provider, MD  folic acid (FOLVITE) 1 MG tablet Take 1 tablet (1 mg total) by mouth daily. 02/16/14  Yes [name], FNP  gabapentin (NEURONTIN) 300 MG capsule Take 1 capsule (300 mg total) by mouth 3 (three) times daily. 02/14/14  Yes Altha Harm, MD  HYDROcodone-acetaminophen (NORCO) 10-325 MG per tablet Take 1 tablet by mouth 3 (three) times daily as needed.   Yes Historical Provider, MD  hydroxyurea (HYDREA) 500 MG capsule Take 2 capsules (1,000 mg total) by mouth daily. May take with food to minimize GI side effects. 08/07/14  Yes [name], FNP  Naproxen Sodium (ALEVE) 220 MG CAPS Take 660 mg by mouth every 6 (six) hours as needed (pain).   Yes Historical Provider, MD  Topiramate ER 100 MG CP24 Take 100 mg by mouth at bedtime.   Yes Historical Provider, MD  Cholecalciferol (VITAMIN D3) 5000 UNITS CAPS Take 1 capsule (5,000 Units total) by mouth daily. 08/07/14   [name], FNP  oxyCODONE-acetaminophen (PERCOCET) 10-325 MG per tablet Take 1 tablet by mouth every 6 (six) hours as needed for pain. 09/11/14   Henrietta Hoover, NP  potassium chloride SA (K-DUR,KLOR-CON) 20 MEQ tablet Take 1 tablet (20 mEq total) by mouth daily. 07/03/14   Altha Harm, MD     Physical Exam: Filed Vitals:   10/11/14 1158 10/11/14 1258 10/11/14 1358 10/11/14 1456  BP: 102/65 98/60 117/69 100/57  Pulse: 74 71 100 82  Temp: 97.6  F (36.4 C) 98 F (36.7 C) 98.1 F (36.7 C) 98.3 F (36.8 C)  TempSrc: Oral Oral Oral Oral  Resp: 12 18 20 13   Height:      Weight:      SpO2: 100% 100% 100% 100%     General: Alert, awake, afebrile, anicteric, not in obvious distress HEENT: Normocephalic and Atraumatic, Mucous membranes pink                PERRLA; EOM intact; No scleral icterus,                 Nares: Patent, Oropharynx: Clear, Fair Dentition                 Neck: FROM, no cervical lymphadenopathy, thyromegaly, carotid bruit or JVD;  Breasts: deferred CHEST WALL: No tenderness   CHEST: Normal respiration, clear to auscultation bilaterally  HEART: Regular rate and rhythm; no murmurs rubs or gallops  BACK: No kyphosis or scoliosis; no CVA tenderness  ABDOMEN: Positive Bowel Sounds, soft, non-tender; no masses, no organomegaly Rectal Exam: deferred EXTREMITIES: No cyanosis, clubbing, or edema Genitalia: not examined  SKIN:  no rash or ulceration  CNS: Alert and Oriented x 4, Nonfocal exam, CN 2-12 intact  Labs on Admission:  Basic Metabolic Panel:  Recent Labs Lab 10/11/14 1118  NA 138  K 3.2*  CL 112*  CO2 19*  GLUCOSE 105*  BUN 8  CREATININE 0.41*  CALCIUM 9.0   Liver Function Tests: No results for input(s): AST, ALT, ALKPHOS, BILITOT, PROT, ALBUMIN in the last 168 hours. No results for input(s): LIPASE, AMYLASE in the last 168 hours. No results for input(s): AMMONIA in the last 168 hours. CBC:  Recent Labs Lab 10/11/14 1118  WBC 11.9*  NEUTROABS 8.2*  HGB 9.2*  HCT 26.1*  MCV 93.9  PLT 358   Cardiac Enzymes: No results for input(s): CKTOTAL, CKMB, CKMBINDEX, TROPONINI in the last 168 hours.  BNP (last 3 results)  Recent Labs  03/18/14 1811 04/29/14 1459  BNP 78.1 114.5*    ProBNP (last 3 results) No results for input(s): PROBNP in the last 8760 hours.  CBG: No results for input(s): GLUCAP in the last 168 hours.   Assessment/Plan Active Problems:   Sickle-cell disease with pain    Admits to the Day Hospital  IVF D5 .45% Saline @ 125 mls/hour  Weight based Dilaudid PCA  IV Toradol  Monitor vitals very closely  Patient will be re-evaluated for pain in the context of function and relationship to baseline as care progresses.  If no significant relieve from pain (remains above 5/10) will transfer patient to inpatient services for further evaluation and management  Code Status: Full Family Communication: None DVT Prophylaxis: Ambulate as tolerated   Time spent: 15  BERNHARDT, LINDA, NP   If 7PM-7AM, please  contact night-coverage [email or web address] 10/11/2014, 3:55 PM

## 2014-10-11 NOTE — H&P (Signed)
[facility] History and Physical  Brittany Foster ZOX:[id number] DOB: [birth year] DOA: 10/11/2014  PCP: Foster,Brittany A., MD   Chief Complaint: Pain  HPI: Brittany Foster is a 29 y.o. female opiate tolerant with Hb SS was last seen here on Friday, 09/24/2014 with similar complaints presents to the [facility] today with pain in her back and legs that has been ongoing for 3 days not responding to oral medication. Patient recently changed pain management clinic and her medications were adjusted to the current doses, she complains that her current medications are not effective in the management of her pain. She rated her pain at about 10 out of 10 and throbbing in nature. Pain is nonradiating, no other relieving factors, aggravated by walking or activity. She denies fever, no chest pain, no shortness of breath. She endorses mild nausea but no vomiting or diarrhea. She needs to fill her prescription for pain medication.  Systemic Review: General: The patient denies anorexia, fever, weight loss Cardiac: Denies chest pain, syncope, palpitations, pedal edema  Respiratory: Denies cough, shortness of breath, wheezing GI: Denies severe indigestion/heartburn, abdominal pain, nausea, vomiting, diarrhea and constipation GU: Denies hematuria, incontinence, dysuria  Musculoskeletal: Denies arthritis  Skin: Denies suspicious skin lesions Neurologic: Denies focal weakness or numbness, change in vision  Past Medical History  Diagnosis Date  . Sickle cell anemia     Past Surgical History  Procedure Laterality Date  . Tubal ligation    . Cholecystectomy    . Cesarean section    . Port a cath placement Right     about 6-7 years ago  . Cholecystectomy  2000    Allergies  Allergen Reactions  . Ultram [Tramadol] Other (See Comments)    seizures  . Morphine And Related Hives, Rash and Other (See Comments)    shaking    Family History  Problem Relation Age of Onset  . Sickle cell  anemia Other   . Sickle cell trait Father   . Sickle cell trait Mother       Prior to Admission medications   Medication Sig Start Date End Date Taking? Authorizing Provider  DULoxetine (CYMBALTA) 30 MG capsule Take 30 mg by mouth daily.   Yes Historical Provider, MD  folic acid (FOLVITE) 1 MG tablet Take 1 tablet (1 mg total) by mouth daily. 02/16/14  Yes Brittany Maroon, FNP  gabapentin (NEURONTIN) 300 MG capsule Take 1 capsule (300 mg total) by mouth 3 (three) times daily. 02/14/14  Yes Brittany Harm, MD  HYDROcodone-acetaminophen (NORCO) 10-325 MG per tablet Take 1 tablet by mouth 3 (three) times daily as needed.   Yes Historical Provider, MD  hydroxyurea (HYDREA) 500 MG capsule Take 2 capsules (1,000 mg total) by mouth daily. May take with food to minimize GI side effects. 08/07/14  Yes Brittany Maroon, FNP  Naproxen Sodium (ALEVE) 220 MG CAPS Take 660 mg by mouth every 6 (six) hours as needed (pain).   Yes Historical Provider, MD  Topiramate ER 100 MG CP24 Take 100 mg by mouth at bedtime.   Yes Historical Provider, MD  Cholecalciferol (VITAMIN D3) 5000 UNITS CAPS Take 1 capsule (5,000 Units total) by mouth daily. 08/07/14   Brittany Maroon, FNP  oxyCODONE-acetaminophen (PERCOCET) 10-325 MG per tablet Take 1 tablet by mouth every 6 (six) hours as needed for pain. 09/11/14   Brittany Hoover, NP  potassium chloride SA (K-DUR,KLOR-CON) 20 MEQ tablet Take 1 tablet (20 mEq total) by mouth daily. 07/03/14  Brittany Harm, MD     Physical Exam: Filed Vitals:   10/11/14 1103  BP: 105/70  Pulse: 84  Temp: 98.3 F (36.8 C)  TempSrc: Oral  Resp: 20  Height: 5' (1.524 m)  Weight: 112 lb (50.803 kg)  SpO2: 95%   General: Alert, awake, afebrile, anicteric, not in obvious distress HEENT: Normocephalic and Atraumatic, Mucous membranes pink                PERRLA; EOM intact; No scleral icterus,                 Nares: Patent, Oropharynx: Clear, Fair Dentition                 Neck:  FROM, no cervical lymphadenopathy, thyromegaly, carotid bruit or JVD;  CHEST: Normal respiration, clear to auscultation bilaterally  HEART: Regular rate and rhythm; no murmurs rubs or gallops  BACK: No kyphosis or scoliosis; no CVA tenderness  ABDOMEN: Positive Bowel Sounds, soft, non-tender; no masses, no organomegaly Rectal Exam: deferred EXTREMITIES: No cyanosis, clubbing, or edema Genitalia: not examined  SKIN:  no rash or ulceration  CNS: Alert and Oriented x 4, Nonfocal exam, CN 2-12 intact  Labs on Admission:  Basic Metabolic Panel: No results for input(s): NA, K, CL, CO2, GLUCOSE, BUN, CREATININE, CALCIUM, MG, PHOS in the last 168 hours. Liver Function Tests: No results for input(s): AST, ALT, ALKPHOS, BILITOT, PROT, ALBUMIN in the last 168 hours. No results for input(s): LIPASE, AMYLASE in the last 168 hours. No results for input(s): AMMONIA in the last 168 hours. CBC: No results for input(s): WBC, NEUTROABS, HGB, HCT, MCV, PLT in the last 168 hours. Cardiac Enzymes: No results for input(s): CKTOTAL, CKMB, CKMBINDEX, TROPONINI in the last 168 hours.  BNP (last 3 results)  Recent Labs  03/18/14 1811 04/29/14 1459  BNP 78.1 114.5*    ProBNP (last 3 results) No results for input(s): PROBNP in the last 8760 hours.  CBG: No results for input(s): GLUCAP in the last 168 hours.   Assessment/Plan Active Problems:   Sickle-cell disease with pain   Admits to the Day Hospital  IVF D5 .45% Saline @ 125 mls/hour  Weight based Dilaudid PCA  IV Toradol  Monitor vitals very closely  Patient will be re-evaluated for pain in the context of function and relationship to baseline as care progresses.  If no significant relieve from pain (remains above 5/10) will transfer patient to inpatient services for further evaluation and management  Code Status: Full Family Communication: None DVT Prophylaxis: Ambulate as tolerated   Time spent: 35 min  Brittany Bolds, MD,  MHA, FACP, CPE   If 7PM-7AM, please contact night-coverage [email or web address] 10/11/2014, 11:22 AM

## 2014-10-12 ENCOUNTER — Telehealth (HOSPITAL_COMMUNITY): Payer: Self-pay | Admitting: Internal Medicine

## 2014-10-12 NOTE — Telephone Encounter (Signed)
Pt states experiencing pain but states that she hasn't taken home medications; pt notified to take home pain medications as prescribed; pt verbalizes understanding

## 2014-10-15 NOTE — Discharge Summary (Signed)
Physician Discharge Summary  Meagen Limones WUJ:[id number] DOB: 1985/12/10 DOA: 10/11/2014  PCP: [name],[name], MD  Admit date: 10/11/2014 Discharge date: 10/11/2014 Time spent: 30 minutes   Discharge Diagnoses:  Active Problems:   Sickle-cell disease with pain   Discharge Condition: Stable Diet recommendation: Regular  Filed Weights   10/11/14 1103  Weight: 112 lb (50.803 kg)    History of present illness:  Brittany Foster is a 29 y.o. female opiate tolerant with Hb SS was last seen here on Friday, 09/24/2014 with similar complaints presents to the [facility] today with pain in her back and legs that has been ongoing for 3 days not responding to oral medication. Patient recently changed pain management clinic and her medications were adjusted to the current doses, she complains that her current medications are not effective in the management of her pain. She rated her pain at about 10 out of 10 and throbbing in nature. Pain is nonradiating, no other relieving factors, aggravated by walking or activity. She denies fever, no chest pain, no shortness of breath. She endorses mild nausea but no vomiting or diarrhea. She needs to fill her prescription for pain medication.  Hospital Course:  Patient was admitted to the day hospital with sickle cell painful crisis. She was treated with IV Dilaudid PCA, Toradol as well as IV fluids. She improved symptomatically and her pain went down from 8 out of 10 down to 2 out of 10 at the time of discharge. She is to follow-up in the clinic as previously scheduled. And she is to continue with her home medications as per prior to admission.  Discharge Exam: Filed Vitals:   10/11/14 1735  BP: 105/69  Pulse: 72  Temp: 98 F (36.7 C)  Resp: 10    General appearance: alert, cooperative and no distress Eyes: conjunctivae/corneas clear. PERRL, EOM's intact. Fundi benign. Neck: no adenopathy, no carotid bruit, no JVD, supple, symmetrical, trachea  midline and thyroid not enlarged, symmetric, no tenderness/mass/nodules Back: symmetric, no curvature. ROM normal. No CVA tenderness. Resp: clear to auscultation bilaterally Chest wall: no tenderness Cardio: regular rate and rhythm, S1, S2 normal, no murmur, click, rub or gallop GI: soft, non-tender; bowel sounds normal; no masses, no organomegaly Extremities: extremities normal, atraumatic, no cyanosis or edema Pulses: 2+ and symmetric Skin: Skin color, texture, turgor normal. No rashes or lesions Neurologic: Grossly normal  Discharge Instructions    Discharge Medication List as of 10/11/2014  5:40 PM    CONTINUE these medications which have NOT CHANGED   Details  DULoxetine (CYMBALTA) 30 MG capsule Take 30 mg by mouth daily., Until Discontinued, Historical Med    folic acid (FOLVITE) 1 MG tablet Take 1 tablet (1 mg total) by mouth daily., Starting 02/16/2014, Until Discontinued, Normal    gabapentin (NEURONTIN) 300 MG capsule Take 1 capsule (300 mg total) by mouth 3 (three) times daily., Starting 02/14/2014, Until Discontinued, Normal    HYDROcodone-acetaminophen (NORCO) 10-325 MG per tablet Take 1 tablet by mouth 3 (three) times daily as needed., Until Discontinued, Historical Med    hydroxyurea (HYDREA) 500 MG capsule Take 2 capsules (1,000 mg total) by mouth daily. May take with food to minimize GI side effects., Starting 08/07/2014, Until Discontinued, Normal    Naproxen Sodium (ALEVE) 220 MG CAPS Take 660 mg by mouth every 6 (six) hours as needed (pain)., Until Discontinued, Historical Med    Topiramate ER 100 MG CP24 Take 100 mg by mouth at bedtime., Until Discontinued, Historical Med  Cholecalciferol (VITAMIN D3) 5000 UNITS CAPS Take 1 capsule (5,000 Units total) by mouth daily., Starting 08/07/2014, Until Discontinued, Normal    oxyCODONE-acetaminophen (PERCOCET) 10-325 MG per tablet Take 1 tablet by mouth every 6 (six) hours as needed for pain., Starting 09/11/2014, Until  Discontinued, Print    potassium chloride SA (K-DUR,KLOR-CON) 20 MEQ tablet Take 1 tablet (20 mEq total) by mouth daily., Starting 07/03/2014, Until Discontinued, Normal       Allergies  Allergen Reactions  . Ultram [Tramadol] Other (See Comments)    seizures  . Morphine And Related Hives, Rash and Other (See Comments)    shaking      The results of significant diagnostics from this hospitalization (including imaging, microbiology, ancillary and laboratory) are listed below for reference.    Significant Diagnostic Studies: No results found.  Microbiology: No results found for this or any previous visit (from the past 240 hour(s)).   Labs: Basic Metabolic Panel:  Recent Labs Lab 10/11/14 1118  NA 138  K 3.2*  CL 112*  CO2 19*  GLUCOSE 105*  BUN 8  CREATININE 0.41*  CALCIUM 9.0   Liver Function Tests: No results for input(s): AST, ALT, ALKPHOS, BILITOT, PROT, ALBUMIN in the last 168 hours. No results for input(s): LIPASE, AMYLASE in the last 168 hours. No results for input(s): AMMONIA in the last 168 hours. CBC:  Recent Labs Lab 10/11/14 1118  WBC 11.9*  NEUTROABS 8.2*  HGB 9.2*  HCT 26.1*  MCV 93.9  PLT 358   Cardiac Enzymes: No results for input(s): CKTOTAL, CKMB, CKMBINDEX, TROPONINI in the last 168 hours. BNP: BNP (last 3 results)  Recent Labs  03/18/14 1811 04/29/14 1459  BNP 78.1 114.5*    ProBNP (last 3 results) No results for input(s): PROBNP in the last 8760 hours.  CBG: No results for input(s): GLUCAP in the last 168 hours.  Signed:  Jeanann Lewandowsky MD, MHA, FACP, CPE   10/15/2014, 5:24 PM

## 2014-10-22 ENCOUNTER — Telehealth (HOSPITAL_COMMUNITY): Payer: Self-pay | Admitting: Internal Medicine

## 2014-10-22 ENCOUNTER — Non-Acute Institutional Stay (HOSPITAL_COMMUNITY)
Admission: AD | Admit: 2014-10-22 | Discharge: 2014-10-22 | Disposition: A | Discharge status: Home / Self Care | Payer: Medicaid Other | Attending: Internal Medicine | Admitting: Internal Medicine

## 2014-10-22 ENCOUNTER — Encounter (HOSPITAL_COMMUNITY): Payer: Self-pay | Admitting: *Deleted

## 2014-10-22 DIAGNOSIS — D5700 Hb-SS disease with crisis, unspecified: Secondary | ICD-10-CM | POA: Diagnosis not present

## 2014-10-22 DIAGNOSIS — Z79891 Long term (current) use of opiate analgesic: Secondary | ICD-10-CM | POA: Diagnosis not present

## 2014-10-22 DIAGNOSIS — Z79899 Other long term (current) drug therapy: Secondary | ICD-10-CM | POA: Diagnosis not present

## 2014-10-22 DIAGNOSIS — Z791 Long term (current) use of non-steroidal anti-inflammatories (NSAID): Secondary | ICD-10-CM | POA: Insufficient documentation

## 2014-10-22 LAB — LACTATE DEHYDROGENASE: LDH: 206 U/L — ABNORMAL HIGH (ref 98–192)

## 2014-10-22 MED ORDER — DEXTROSE-NACL 5-0.45 % IV SOLN
INTRAVENOUS | Status: DC
  Administered 2014-10-22: 11:00:00 via INTRAVENOUS

## 2014-10-22 MED ORDER — NALOXONE HCL 0.4 MG/ML IJ SOLN: 0.4000 mg | INTRAMUSCULAR | Status: DC | PRN

## 2014-10-22 MED ORDER — KETOROLAC TROMETHAMINE 15 MG/ML IJ SOLN
15.0000 mg | Freq: Four times a day (QID) | INTRAMUSCULAR | Status: DC
  Administered 2014-10-22: 15 mg via INTRAVENOUS
  Filled 2014-10-22 (×4): qty 1

## 2014-10-22 MED ORDER — DIPHENHYDRAMINE HCL 12.5 MG/5ML PO ELIX: 12.5000 mg | ORAL_SOLUTION | Freq: Four times a day (QID) | ORAL | Status: DC | PRN

## 2014-10-22 MED ORDER — HEPARIN SOD (PORK) LOCK FLUSH 100 UNIT/ML IV SOLN
500.0000 [IU] | INTRAVENOUS | Status: AC | PRN
  Administered 2014-10-22: 500 [IU]
  Filled 2014-10-22: qty 5

## 2014-10-22 MED ORDER — SODIUM CHLORIDE 0.9 % IJ SOLN
10.0000 mL | INTRAMUSCULAR | Status: AC | PRN
  Administered 2014-10-22: 10 mL

## 2014-10-22 MED ORDER — FOLIC ACID 1 MG PO TABS: 1.0000 mg | ORAL_TABLET | Freq: Every day | ORAL | Status: DC

## 2014-10-22 MED ORDER — SENNOSIDES-DOCUSATE SODIUM 8.6-50 MG PO TABS: 1.0000 | ORAL_TABLET | Freq: Two times a day (BID) | ORAL | Status: DC

## 2014-10-22 MED ORDER — SODIUM CHLORIDE 0.9 % IJ SOLN: 9.0000 mL | INTRAMUSCULAR | Status: DC | PRN

## 2014-10-22 MED ORDER — SODIUM CHLORIDE 0.9 % IV SOLN
12.5000 mg | Freq: Four times a day (QID) | INTRAVENOUS | Status: DC | PRN
  Administered 2014-10-22: 12.5 mg via INTRAVENOUS
  Filled 2014-10-22 (×3): qty 0.25

## 2014-10-22 MED ORDER — HYDROMORPHONE 2 MG/ML HIGH CONCENTRATION IV PCA SOLN
INTRAVENOUS | Status: DC
  Administered 2014-10-22 (×2): 6 mg via INTRAVENOUS
  Administered 2014-10-22: 4.5 mg via INTRAVENOUS
  Administered 2014-10-22: 12:00:00 via INTRAVENOUS
  Filled 2014-10-22: qty 25

## 2014-10-22 MED ORDER — ONDANSETRON HCL 4 MG/2ML IJ SOLN: 4.0000 mg | Freq: Four times a day (QID) | INTRAMUSCULAR | Status: DC | PRN

## 2014-10-22 MED ORDER — POLYETHYLENE GLYCOL 3350 17 G PO PACK: 17.0000 g | PACK | Freq: Every day | ORAL | Status: DC | PRN

## 2014-10-22 NOTE — Telephone Encounter (Signed)
Pt called and states experiencing pain in back, legs, and hips; states pain not relieved by home pain medications; pt denies CP, SOB, N/V/D, and abdominal pain; pt informed that MD would be notified and she would receive a return call; pt verbalizes understanding

## 2014-10-22 NOTE — H&P (Signed)
[facility] History and Physical  Freddi Forster RUE:[id number] DOB: Jul 17, 1985 DOA: 10/22/2014  PCP: Altha Harm., MD   Chief Complaint: Sickle cell crisis  HPI: Brittany Foster is a 29 y.o. female presents for treatment for sickle cell crisis not managed by home medications. This episode began yesterday afternoon. Describes pain as 7/10. Pain is in ribs and hips and chest  She used her Belcuca buccal last night about 10 pm. Norco 10/325 last at 6 am this am. Nothing different about this episode   General: The patient denies anorexia, fever, weight loss. Some night sweats Cardiac: Denies  syncope, palpitations, pedal edema Some chest pain and feeling like heart racing Respiratory: Denies cough, wheezing. Mild SOB GI: Denies severe indigestion/heartburn, abdominal pain,  vomiting, diarrhea and constipation. Mild nausea GU: Denies hematuria, incontinence, dysuria  Musculoskeletal: Pain as in HPI Skin: Denies suspicious skin lesions Neurologic: Denies focal numbness, change in vision. Weakness in legs.  Past Medical History  Diagnosis Date  . Sickle cell anemia     Past Surgical History  Procedure Laterality Date  . Tubal ligation    . Cholecystectomy    . Cesarean section    . Port a cath placement Right     about 6-7 years ago  . Cholecystectomy  2000      Allergies  Allergen Reactions  . Ultram [Tramadol] Other (See Comments)    seizures  . Morphine And Related Hives, Rash and Other (See Comments)    shaking    Family History  Problem Relation Age of Onset  . Sickle cell anemia Other   . Sickle cell trait Father   . Sickle cell trait Mother       Prior to Admission medications   Medication Sig Start Date End Date Taking? Authorizing Provider  DULoxetine (CYMBALTA) 30 MG capsule Take 30 mg by mouth daily.   Yes Historical Provider, MD  folic acid (FOLVITE) 1 MG tablet Take 1 tablet (1 mg total) by mouth daily. 02/16/14  Yes [name],  FNP  gabapentin (NEURONTIN) 300 MG capsule Take 1 capsule (300 mg total) by mouth 3 (three) times daily. 02/14/14  Yes Altha Harm, MD  HYDROcodone-acetaminophen (NORCO) 10-325 MG per tablet Take 1 tablet by mouth 3 (three) times daily as needed.   Yes Historical Provider, MD  hydroxyurea (HYDREA) 500 MG capsule Take 2 capsules (1,000 mg total) by mouth daily. May take with food to minimize GI side effects. 08/07/14  Yes [name], FNP  Naproxen Sodium (ALEVE) 220 MG CAPS Take 660 mg by mouth every 6 (six) hours as needed (pain).   Yes Historical Provider, MD  Topiramate ER 100 MG CP24 Take 100 mg by mouth at bedtime.   Yes Historical Provider, MD  Cholecalciferol (VITAMIN D3) 5000 UNITS CAPS Take 1 capsule (5,000 Units total) by mouth daily. 08/07/14   [name], FNP  oxyCODONE-acetaminophen (PERCOCET) 10-325 MG per tablet Take 1 tablet by mouth every 6 (six) hours as needed for pain. 09/11/14   Henrietta Hoover, NP  potassium chloride SA (K-DUR,KLOR-CON) 20 MEQ tablet Take 1 tablet (20 mEq total) by mouth daily. 07/03/14   Altha Harm, MD     Physical Exam: Filed Vitals:   10/22/14 1047  BP: 105/68  Pulse: 89  Temp: 98.8 F (37.1 C)  TempSrc: Oral  Resp: 16  Height: 5' (1.524 m)  Weight: 112 lb (50.803 kg)  SpO2: 100%     General: Alert, awake,  afebrile, anicteric, not in obvious distress HEENT: Normocephalic and Atraumatic, Mucous membranes pink                PERRLA; EOM intact; No scleral icterus,                 Nares: Patent, Oropharynx: Clear, Fair Dentition                 Neck: FROM, no cervical lymphadenopathy, thyromegaly, carotid bruit or JVD;  Breasts: deferred CHEST WALL: No tenderness  CHEST: Normal respiration, clear to auscultation bilaterally  HEART: Regular rate and rhythm; no murmurs rubs or gallops  BACK:   no CVA tenderness  ABDOMEN: Positive Bowel Sounds, soft, non-tender; no masses, no organomegaly Rectal Exam:  deferred EXTREMITIES: No cyanosis, clubbing, or edema Genitalia: not examined  SKIN:  no rash or ulceration  CNS: Alert and Oriented x 4, Nonfocal exam, CN 2-12 intact  Labs on Admission:  Basic Metabolic Panel: No results for input(s): NA, K, CL, CO2, GLUCOSE, BUN, CREATININE, CALCIUM, MG, PHOS in the last 168 hours. Liver Function Tests: No results for input(s): AST, ALT, ALKPHOS, BILITOT, PROT, ALBUMIN in the last 168 hours. No results for input(s): LIPASE, AMYLASE in the last 168 hours. No results for input(s): AMMONIA in the last 168 hours. CBC: No results for input(s): WBC, NEUTROABS, HGB, HCT, MCV, PLT in the last 168 hours. Cardiac Enzymes: No results for input(s): CKTOTAL, CKMB, CKMBINDEX, TROPONINI in the last 168 hours.  BNP (last 3 results)  Recent Labs  03/18/14 1811 04/29/14 1459  BNP 78.1 114.5*    ProBNP (last 3 results) No results for input(s): PROBNP in the last 8760 hours.  CBG: No results for input(s): GLUCAP in the last 168 hours.   Assessment/Plan Active Problems:   Sickle cell crisis   Admits to the Day Hospital  IVF D5 .45% Saline @ 125 mls/hour  Weight based Dilaudid PCA  IV Toradol  Monitor vitals very closely  Patient will be re-evaluated for pain in the context of function and relationship to baseline as care progresses.  If no significant relieve from pain (remains above 5/10) will transfer patient to inpatient services for further evaluation and management  Code Status: Full Family Communication: None DVT Prophylaxis: Ambulate as tolerated   Time spent: 35 minutes  BERNHARDT, LINDA, NP   If 7PM-7AM, please contact night-coverage [email or web address]  10/22/2014, 10:55 AM

## 2014-10-22 NOTE — Progress Notes (Signed)
Pt discharged to home; discharge instructions explained, given, and signed; all questions answered; pt alert and oriented at discharge; port deaccessed with no complications noted; pt transport is cab to home

## 2014-10-22 NOTE — Telephone Encounter (Signed)
Notified patient that, per MD, she may come to day hospital for evaluation; pt verbalizes understanding 

## 2014-10-23 NOTE — Discharge Summary (Signed)
Physician Discharge Summary  Brittany Foster ZOX:[id number] DOB: 10/28/1985 DOA: 10/22/2014  PCP: Altha Harm., MD  Admit date: 10/22/2014 Discharge date: 10/22/2014 Time spent: 30 minutes   Discharge Diagnoses:  Active Problems:   Sickle cell crisis   Discharge Condition: Stable Diet recommendation: Regular  Filed Weights   10/22/14 1047  Weight: 112 lb (50.803 kg)    History of present illness:  Brittany Foster is a 29 y.o. female presents for treatment for sickle cell crisis not managed by home medications. This episode began yesterday afternoon. Describes pain as 7/10. Pain is in ribs and hips and chest She used her Belcuca buccal last night about 10 pm. Norco 10/325 last at 6 am this am. Nothing different about this episode  Hospital Course:  Patient was admitted to the day hospital with sickle cell painful crisis. She was treated with IV Dilaudid PCA, Toradol as well as IV fluids. She improved symptomatically and her pain went down from 7 out of 10 down to goal of 4 out of 10 at the time of discharge. She is to follow-up in the clinic as previously scheduled. And she is to continue with her home medications as per prior to admission.  Discharge Exam: Filed Vitals:   10/22/14 1445  BP: 100/61  Pulse: 84  Temp: 98 F (36.7 C)  Resp: 14    General appearance: alert, cooperative and no distress Eyes: conjunctivae/corneas clear. PERRL, EOM's intact. Fundi benign. Neck: no adenopathy, no carotid bruit, no JVD, supple, symmetrical, trachea midline and thyroid not enlarged, symmetric, no tenderness/mass/nodules Back: symmetric, no curvature. ROM normal. No CVA tenderness. Resp: clear to auscultation bilaterally Chest wall: no tenderness Cardio: regular rate and rhythm, S1, S2 normal, no murmur, click, rub or gallop GI: soft, non-tender; bowel sounds normal; no masses, no organomegaly Extremities: extremities normal, atraumatic, no cyanosis or edema Pulses: 2+ and  symmetric Skin: Skin color, texture, turgor normal. No rashes or lesions Neurologic: Grossly normal  Discharge Instructions    Discharge Medication List as of 10/22/2014  5:56 PM    CONTINUE these medications which have NOT CHANGED   Details  DULoxetine (CYMBALTA) 30 MG capsule Take 30 mg by mouth daily., Until Discontinued, Historical Med    folic acid (FOLVITE) 1 MG tablet Take 1 tablet (1 mg total) by mouth daily., Starting 02/16/2014, Until Discontinued, Normal    gabapentin (NEURONTIN) 300 MG capsule Take 1 capsule (300 mg total) by mouth 3 (three) times daily., Starting 02/14/2014, Until Discontinued, Normal    HYDROcodone-acetaminophen (NORCO) 10-325 MG per tablet Take 1 tablet by mouth 3 (three) times daily as needed., Until Discontinued, Historical Med    hydroxyurea (HYDREA) 500 MG capsule Take 2 capsules (1,000 mg total) by mouth daily. May take with food to minimize GI side effects., Starting 08/07/2014, Until Discontinued, Normal    Naproxen Sodium (ALEVE) 220 MG CAPS Take 660 mg by mouth every 6 (six) hours as needed (pain)., Until Discontinued, Historical Med    Topiramate ER 100 MG CP24 Take 100 mg by mouth at bedtime., Until Discontinued, Historical Med    Cholecalciferol (VITAMIN D3) 5000 UNITS CAPS Take 1 capsule (5,000 Units total) by mouth daily., Starting 08/07/2014, Until Discontinued, Normal    oxyCODONE-acetaminophen (PERCOCET) 10-325 MG per tablet Take 1 tablet by mouth every 6 (six) hours as needed for pain., Starting 09/11/2014, Until Discontinued, Print    potassium chloride SA (K-DUR,KLOR-CON) 20 MEQ tablet Take 1 tablet (20 mEq total) by mouth daily., Starting 07/03/2014, Until Discontinued, Normal  Allergies  Allergen Reactions  . Ultram [Tramadol] Other (See Comments)    seizures  . Morphine And Related Hives, Rash and Other (See Comments)    shaking      The results of significant diagnostics from this hospitalization (including imaging,  microbiology, ancillary and laboratory) are listed below for reference.    Significant Diagnostic Studies: No results found.  Microbiology: No results found for this or any previous visit (from the past 240 hour(s)).   Labs: Basic Metabolic Panel: No results for input(s): NA, K, CL, CO2, GLUCOSE, BUN, CREATININE, CALCIUM, MG, PHOS in the last 168 hours. Liver Function Tests: No results for input(s): AST, ALT, ALKPHOS, BILITOT, PROT, ALBUMIN in the last 168 hours. No results for input(s): LIPASE, AMYLASE in the last 168 hours. No results for input(s): AMMONIA in the last 168 hours. CBC: No results for input(s): WBC, NEUTROABS, HGB, HCT, MCV, PLT in the last 168 hours. Cardiac Enzymes: No results for input(s): CKTOTAL, CKMB, CKMBINDEX, TROPONINI in the last 168 hours. BNP: BNP (last 3 results)  Recent Labs  03/18/14 1811 04/29/14 1459  BNP 78.1 114.5*    ProBNP (last 3 results) No results for input(s): PROBNP in the last 8760 hours.  CBG: No results for input(s): GLUCAP in the last 168 hours.   Signed:  Jeanann Lewandowsky MD, MHA, FACP, CPE   10/23/2014, 4:02 PM

## 2014-10-24 ENCOUNTER — Telehealth (HOSPITAL_COMMUNITY): Payer: Self-pay | Admitting: Hematology

## 2014-10-24 ENCOUNTER — Telehealth (HOSPITAL_COMMUNITY): Payer: Self-pay

## 2014-10-24 NOTE — Telephone Encounter (Signed)
Patient called complaining of pain all over. Rates pain 8/10. She went to pain clinic yesterday and was given prescription for percocet which she has been taking without relief. Denies fever, chest pain, nausea/ vomiting or diarrhea. She would like to come to the pain clinic for treatment

## 2014-10-24 NOTE — Telephone Encounter (Signed)
Spoke with Dr. Hyman Hopes.  Advised patient to take pain medication as prescribed, increase fluids, contact the pain clinic if she feels like pain regimen is not working, and advised patient to schedule an appointment for evaluation and management of medication.  Patient verbalizes understanding.

## 2014-10-25 ENCOUNTER — Telehealth (HOSPITAL_COMMUNITY): Payer: Self-pay | Admitting: Internal Medicine

## 2014-10-25 NOTE — Telephone Encounter (Signed)
Notified patient that, per MD, she will need to follow-up with her pain clinic provider to help manage pain at home; pt verbalizes understanding

## 2014-10-25 NOTE — Telephone Encounter (Signed)
Pt states experiencing pain in hip, ribs, and back; states taken home pain medications with little relief; pt denies CP, SOB, N/V/D or abdominal pain; states that she has contacted the pain clinic where she receives care to inquire about home pain medication regimen and is awaiting a return call; MD notified

## 2014-10-29 ENCOUNTER — Non-Acute Institutional Stay (HOSPITAL_COMMUNITY)
Admission: AD | Admit: 2014-10-29 | Discharge: 2014-10-29 | Disposition: A | Discharge status: Home / Self Care | Payer: Medicaid Other | Attending: Internal Medicine | Admitting: Internal Medicine

## 2014-10-29 ENCOUNTER — Telehealth (HOSPITAL_COMMUNITY): Payer: Self-pay | Admitting: *Deleted

## 2014-10-29 ENCOUNTER — Encounter (HOSPITAL_COMMUNITY): Payer: Self-pay

## 2014-10-29 DIAGNOSIS — Z791 Long term (current) use of non-steroidal anti-inflammatories (NSAID): Secondary | ICD-10-CM | POA: Insufficient documentation

## 2014-10-29 DIAGNOSIS — D5700 Hb-SS disease with crisis, unspecified: Secondary | ICD-10-CM | POA: Diagnosis not present

## 2014-10-29 DIAGNOSIS — Z79891 Long term (current) use of opiate analgesic: Secondary | ICD-10-CM | POA: Diagnosis not present

## 2014-10-29 DIAGNOSIS — Z79899 Other long term (current) drug therapy: Secondary | ICD-10-CM | POA: Insufficient documentation

## 2014-10-29 LAB — LACTATE DEHYDROGENASE: LDH: 221 U/L — AB (ref 98–192)

## 2014-10-29 MED ORDER — DEXTROSE-NACL 5-0.45 % IV SOLN
INTRAVENOUS | Status: DC
  Administered 2014-10-29: 12:00:00 via INTRAVENOUS

## 2014-10-29 MED ORDER — SODIUM CHLORIDE 0.9 % IJ SOLN
10.0000 mL | INTRAMUSCULAR | Status: AC | PRN
  Administered 2014-10-29: 10 mL

## 2014-10-29 MED ORDER — NALOXONE HCL 0.4 MG/ML IJ SOLN: 0.4000 mg | INTRAMUSCULAR | Status: DC | PRN

## 2014-10-29 MED ORDER — SENNOSIDES-DOCUSATE SODIUM 8.6-50 MG PO TABS: 1.0000 | ORAL_TABLET | Freq: Two times a day (BID) | ORAL | Status: DC

## 2014-10-29 MED ORDER — KETOROLAC TROMETHAMINE 30 MG/ML IJ SOLN
30.0000 mg | Freq: Four times a day (QID) | INTRAMUSCULAR | Status: DC
  Administered 2014-10-29: 30 mg via INTRAVENOUS
  Filled 2014-10-29: qty 1

## 2014-10-29 MED ORDER — POLYETHYLENE GLYCOL 3350 17 G PO PACK: 17.0000 g | PACK | Freq: Every day | ORAL | Status: DC | PRN

## 2014-10-29 MED ORDER — HYDROMORPHONE 2 MG/ML HIGH CONCENTRATION IV PCA SOLN
INTRAVENOUS | Status: DC
  Administered 2014-10-29: 12:00:00 via INTRAVENOUS
  Filled 2014-10-29: qty 25

## 2014-10-29 MED ORDER — SODIUM CHLORIDE 0.9 % IJ SOLN: 9.0000 mL | INTRAMUSCULAR | Status: DC | PRN

## 2014-10-29 MED ORDER — ONDANSETRON HCL 4 MG/2ML IJ SOLN
4.0000 mg | Freq: Four times a day (QID) | INTRAMUSCULAR | Status: DC | PRN
  Administered 2014-10-29: 4 mg via INTRAVENOUS
  Filled 2014-10-29: qty 2

## 2014-10-29 MED ORDER — FOLIC ACID 1 MG PO TABS
1.0000 mg | ORAL_TABLET | Freq: Every day | ORAL | Status: DC
  Administered 2014-10-29: 1 mg via ORAL
  Filled 2014-10-29: qty 1

## 2014-10-29 MED ORDER — HEPARIN SOD (PORK) LOCK FLUSH 100 UNIT/ML IV SOLN
500.0000 [IU] | INTRAVENOUS | Status: AC | PRN
  Administered 2014-10-29: 500 [IU]
  Filled 2014-10-29: qty 5

## 2014-10-29 MED ORDER — DIPHENHYDRAMINE HCL 12.5 MG/5ML PO ELIX: 12.5000 mg | ORAL_SOLUTION | Freq: Four times a day (QID) | ORAL | Status: DC | PRN

## 2014-10-29 MED ORDER — SODIUM CHLORIDE 0.9 % IV SOLN
12.5000 mg | Freq: Four times a day (QID) | INTRAVENOUS | Status: DC | PRN
  Administered 2014-10-29: 12.5 mg via INTRAVENOUS
  Filled 2014-10-29 (×3): qty 0.25

## 2014-10-29 NOTE — H&P (Signed)
[facility] History and Physical  Brittany Foster WUJ:[id number] DOB: 08-08-1985 DOA: 10/29/2014  PCP: Altha Harm., MD   Chief Complaint:  Chief Complaint  Patient presents with  . Sickle Cell Pain Crisis    generalized 7/10    HPI: Brittany Foster is a 29 y.o. female with history of sickle cell disease who presents for treatment of a pain crisis that started yesterday. She reports her pain is 7/10. She reports there is nothing different about this episode. The pain is in her back, ribs and hips. She has not taken her long acting opoid since Saturday. She had her last percocet this am at 6 am.   Systemic Review: General: The patient denies anorexia, fever, weight loss Cardiac: Denies chest pain, syncope, palpitations, pedal edema  Respiratory: Denies cough, shortness of breath, wheezing GI: Denies severe indigestion/heartburn, abdominal pain, nausea, vomiting, diarrhea and constipation GU: Denies hematuria, incontinence, dysuria  Musculoskeletal: Denies arthritis  Skin: Denies suspicious skin lesions Neurologic: Denies focal weakness or numbness, change in vision  Past Medical History  Diagnosis Date  . Sickle cell anemia     Past Surgical History  Procedure Laterality Date  . Tubal ligation    . Cholecystectomy    . Cesarean section    . Port a cath placement Right     about 6-7 years ago  . Cholecystectomy  2000    Allergies  Allergen Reactions  . Ultram [Tramadol] Other (See Comments)    seizures  . Morphine And Related Hives, Rash and Other (See Comments)    shaking    Family History  Problem Relation Age of Onset  . Sickle cell anemia Other   . Sickle cell trait Father   . Sickle cell trait Mother       Prior to Admission medications   Medication Sig Start Date End Date Taking? Authorizing Provider  DULoxetine (CYMBALTA) 30 MG capsule Take 30 mg by mouth daily.   Yes Historical Provider, MD  folic acid (FOLVITE) 1 MG tablet Take 1  tablet (1 mg total) by mouth daily. 02/16/14  Yes [name], FNP  gabapentin (NEURONTIN) 300 MG capsule Take 1 capsule (300 mg total) by mouth 3 (three) times daily. 02/14/14  Yes Altha Harm, MD  hydroxyurea (HYDREA) 500 MG capsule Take 2 capsules (1,000 mg total) by mouth daily. May take with food to minimize GI side effects. 08/07/14  Yes [name], FNP  Naproxen Sodium (ALEVE) 220 MG CAPS Take 660 mg by mouth every 6 (six) hours as needed (pain).   Yes Historical Provider, MD  oxyCODONE-acetaminophen (PERCOCET) 10-325 MG per tablet Take 1 tablet by mouth every 6 (six) hours as needed for pain. 09/11/14  Yes Henrietta Hoover, NP  Topiramate ER 100 MG CP24 Take 100 mg by mouth at bedtime.   Yes Historical Provider, MD  Cholecalciferol (VITAMIN D3) 5000 UNITS CAPS Take 1 capsule (5,000 Units total) by mouth daily. 08/07/14   [name], FNP  HYDROcodone-acetaminophen (NORCO) 10-325 MG per tablet Take 1 tablet by mouth 3 (three) times daily as needed.    Historical Provider, MD  potassium chloride SA (K-DUR,KLOR-CON) 20 MEQ tablet Take 1 tablet (20 mEq total) by mouth daily. 07/03/14   Altha Harm, MD     Physical Exam: Filed Vitals:   10/29/14 1111 10/29/14 1140  BP: 110/61   Pulse: 87   Temp: 98.1 F (36.7 C)   TempSrc: Oral   Resp: 18 14  Height: 5' (1.524 m)   Weight: 120 lb (54.432 kg)   SpO2: 100% 97%    General: Alert, awake, afebrile, anicteric, not in obvious distress HEENT: Normocephalic and Atraumatic, Mucous membranes pink                PERRLA; EOM intact; No scleral icterus,                 Nares: Patent, Oropharynx: Clear, Fair Dentition                 Neck: FROM, no cervical lymphadenopathy, thyromegaly, carotid bruit or JVD;  CHEST WALL: No tenderness  CHEST: Normal respiration, clear to auscultation bilaterally  HEART: Regular rate and rhythm; no murmurs rubs or gallops  BACK: No kyphosis or scoliosis; no CVA tenderness  ABDOMEN:  Positive Bowel Sounds, soft, non-tender; no masses, no organomegaly Rectal Exam: deferred EXTREMITIES: No cyanosis, clubbing, or edema SKIN:  no rash or ulceration  CNS: Alert and Oriented x 4, Nonfocal exam, CN 2-12 intact  Labs on Admission:  Basic Metabolic Panel: No results for input(s): NA, K, CL, CO2, GLUCOSE, BUN, CREATININE, CALCIUM, MG, PHOS in the last 168 hours. Liver Function Tests: No results for input(s): AST, ALT, ALKPHOS, BILITOT, PROT, ALBUMIN in the last 168 hours. No results for input(s): LIPASE, AMYLASE in the last 168 hours. No results for input(s): AMMONIA in the last 168 hours. CBC: No results for input(s): WBC, NEUTROABS, HGB, HCT, MCV, PLT in the last 168 hours. Cardiac Enzymes: No results for input(s): CKTOTAL, CKMB, CKMBINDEX, TROPONINI in the last 168 hours.  BNP (last 3 results)  Recent Labs  03/18/14 1811 04/29/14 1459  BNP 78.1 114.5*    ProBNP (last 3 results) No results for input(s): PROBNP in the last 8760 hours.  CBG: No results for input(s): GLUCAP in the last 168 hours.   Assessment/Plan Active Problems:   Sickle cell crisis   Admits to the Day Hospital  IVF D5 .45% Saline @ 125 mls/hour  Weight based Dilaudid PCA started within 30 minutes of admission  IV Toradol   Monitor vitals very closely, Re-evaluate pain scale every hour  Oxygen by [place]  Patient will be re-evaluated for pain in the context of function and relationship to baseline as care progresses.  If no significant relieve from pain (remains above 5/10) will transfer patient to inpatient services for further evaluation and management  Code Status: Full  Family Communication: None  DVT Prophylaxis: Ambulate as tolerated   Time spent: 35 Minutes  Ezequias Lard, MSN, FNP-BC.   If 7PM-7AM, please contact night-coverage [email or web address] 10/29/2014, 11:48 AM

## 2014-10-29 NOTE — Progress Notes (Signed)
Patient seen in the day hospital today for treatment. Pain 7/10 at discharge. Patient declines any further needs at this time. Discharge instruction reviewed and given to the patient. Patient alert, oriented, and ambulatory at discharge.

## 2014-10-29 NOTE — Discharge Summary (Signed)
PCP: [name],[name], MD  Admit date: 10/29/2014 Discharge date: 10/29/2014  Time spent: 15 minutes   Discharge Diagnoses:  Active Problems:   Sickle cell crisis   Discharge Condition: Stable Diet recommendation: Regular  Filed Weights   10/29/14 1111  Weight: 120 lb (54.432 kg)    History of present illness:  Brittany Foster is a 29 y.o. female with history of sickle cell disease who presents for treatment of a pain crisis that started yesterday. She reports her pain is 7/10. She reports there is nothing different about this episode. The pain is in her back, ribs and hips. She has not taken her long acting opoid since Saturday. She had her last percocet this am at 6 am.    Hospital Course:  Patient was admitted to the day hospital with sickle cell painful crisis. She was treated with IV Dilaudid PCA, Toradol as well as IV fluids. She improved symptomatically and her pain went down from 8 out of 10 down to 2 out of 10 at the time of discharge. She is to follow-up in the clinic as previously scheduled. And she is to continue with her home medications as per prior to admission.  Discharge Exam: Filed Vitals:   10/29/14 1301  BP: 95/54  Pulse: 74  Temp: 97.7 F (36.5 C)  Resp: 14    General appearance: alert, cooperative and no distress Eyes: conjunctivae/corneas clear. PERRL, EOM's intact. Fundi benign. Neck: no adenopathy, no carotid bruit, no JVD, supple, symmetrical, trachea midline and thyroid not enlarged, symmetric, no tenderness/mass/nodules Back: symmetric, no curvature. ROM normal. No CVA tenderness. Resp: clear to auscultation bilaterally Chest wall: no tenderness Cardio: regular rate and rhythm, S1, S2 normal, no murmur, click, rub or gallop GI: soft, non-tender; bowel sounds normal; no masses, no organomegaly Extremities: extremities normal, atraumatic, no cyanosis or edema Pulses: 2+ and symmetric Skin: Skin color, texture, turgor normal. No rashes  or lesions Neurologic: Grossly normal  Discharge Instructions  Home to self-care with previously prescribed medications Follow-up as needed.    Current Discharge Medication List    CONTINUE these medications which have NOT CHANGED   Details  DULoxetine (CYMBALTA) 30 MG capsule Take 30 mg by mouth daily.   Associated Diagnoses: Depression    folic acid (FOLVITE) 1 MG tablet Take 1 tablet (1 mg total) by mouth daily. 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 Qty: 90 capsule, Refills: 2    hydroxyurea (HYDREA) 500 MG capsule Take 2 capsules (1,000 mg total) by mouth daily. May take with food to minimize GI side effects. 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    oxyCODONE-acetaminophen (PERCOCET) 10-325 MG per tablet Take 1 tablet by mouth every 6 (six) hours as needed for pain. Qty: 100 tablet, Refills: 0   Associated Diagnoses: Chronic prescription opiate use    Topiramate ER 100 MG CP24 Take 100 mg by mouth at bedtime.    Cholecalciferol (VITAMIN D3) 5000 UNITS CAPS Take 1 capsule (5,000 Units total) by mouth daily. Qty: 30 capsule, Refills: 5   Associated Diagnoses: Vitamin D deficiency    HYDROcodone-acetaminophen (NORCO) 10-325 MG per tablet Take 1 tablet by mouth 3 (three) times daily  as needed.    potassium chloride SA (K-DUR,KLOR-CON) 20 MEQ tablet Take 1 tablet (20 mEq total) by mouth daily. Qty: 30 tablet, Refills: 1       Allergies  Allergen Reactions  . Ultram [Tramadol] Other (See Comments)    seizures  . Morphine And Related Hives, Rash and Other (See Comments)    shaking      The results of significant diagnostics from this hospitalization (including imaging,  microbiology, ancillary and laboratory) are listed below for reference.    Significant Diagnostic Studies: No results found.  Microbiology: No results found for this or any previous visit (from the past 240 hour(s)).   Labs: Basic Metabolic Panel: No results for input(s): NA, K, CL, CO2, GLUCOSE, BUN, CREATININE, CALCIUM, MG, PHOS in the last 168 hours. Liver Function Tests: No results for input(s): AST, ALT, ALKPHOS, BILITOT, PROT, ALBUMIN in the last 168 hours. No results for input(s): LIPASE, AMYLASE in the last 168 hours. No results for input(s): AMMONIA in the last 168 hours. CBC: No results for input(s): WBC, NEUTROABS, HGB, HCT, MCV, PLT in the last 168 hours. Cardiac Enzymes: No results for input(s): CKTOTAL, CKMB, CKMBINDEX, TROPONINI in the last 168 hours. BNP: BNP (last 3 results)  Recent Labs  03/18/14 1811 04/29/14 1459  BNP 78.1 114.5*    ProBNP (last 3 results) No results for input(s): PROBNP in the last 8760 hours.  CBG: No results for input(s): GLUCAP in the last 168 hours.     Signed:  Concepcion Living MSN,FNP,BC

## 2014-11-01 ENCOUNTER — Telehealth (HOSPITAL_COMMUNITY): Payer: Self-pay | Admitting: Internal Medicine

## 2014-11-01 NOTE — Telephone Encounter (Signed)
Pt notified that, per NP, she should increase fluid intake at home and follow-up with pain clinic for continuation of evaluation of pain management; notified pt that if she is experiencing SOB, she should go to the ED for evaluation; pt verbalizes understanding

## 2014-11-01 NOTE — Telephone Encounter (Signed)
Pt called and states experiencing pain in ribs and hip; pt denies CP, nausea, vomiting, diarrhea, states has some SOB; NP notified

## 2014-11-08 ENCOUNTER — Telehealth (HOSPITAL_COMMUNITY): Payer: Self-pay

## 2014-11-08 ENCOUNTER — Non-Acute Institutional Stay (HOSPITAL_COMMUNITY)
Admission: AD | Admit: 2014-11-08 | Discharge: 2014-11-08 | Disposition: A | Discharge status: Home / Self Care | Payer: Medicaid Other | Attending: Internal Medicine | Admitting: Internal Medicine

## 2014-11-08 ENCOUNTER — Encounter (HOSPITAL_COMMUNITY): Payer: Self-pay

## 2014-11-08 DIAGNOSIS — Z79899 Other long term (current) drug therapy: Secondary | ICD-10-CM | POA: Insufficient documentation

## 2014-11-08 DIAGNOSIS — D5700 Hb-SS disease with crisis, unspecified: Secondary | ICD-10-CM | POA: Diagnosis present

## 2014-11-08 DIAGNOSIS — Z87891 Personal history of nicotine dependence: Secondary | ICD-10-CM | POA: Diagnosis not present

## 2014-11-08 DIAGNOSIS — Z79891 Long term (current) use of opiate analgesic: Secondary | ICD-10-CM | POA: Insufficient documentation

## 2014-11-08 DIAGNOSIS — R52 Pain, unspecified: Secondary | ICD-10-CM | POA: Diagnosis present

## 2014-11-08 DIAGNOSIS — Z791 Long term (current) use of non-steroidal anti-inflammatories (NSAID): Secondary | ICD-10-CM | POA: Diagnosis not present

## 2014-11-08 LAB — CBC WITH DIFFERENTIAL/PLATELET
BASOS ABS: 0 10*3/uL (ref 0.0–0.1)
BASOS PCT: 0 % (ref 0–1)
EOS ABS: 0 10*3/uL (ref 0.0–0.7)
EOS PCT: 0 % (ref 0–5)
HCT: 28.3 % — ABNORMAL LOW (ref 36.0–46.0)
Hemoglobin: 9.8 g/dL — ABNORMAL LOW (ref 12.0–15.0)
Lymphocytes Relative: 16 % (ref 12–46)
Lymphs Abs: 1.7 10*3/uL (ref 0.7–4.0)
MCH: 34.6 pg — ABNORMAL HIGH (ref 26.0–34.0)
MCHC: 34.6 g/dL (ref 30.0–36.0)
MCV: 100 fL (ref 78.0–100.0)
MONO ABS: 0.7 10*3/uL (ref 0.1–1.0)
Monocytes Relative: 7 % (ref 3–12)
Neutro Abs: 7.8 10*3/uL — ABNORMAL HIGH (ref 1.7–7.7)
Neutrophils Relative %: 77 % (ref 43–77)
PLATELETS: 284 10*3/uL (ref 150–400)
RBC: 2.83 MIL/uL — AB (ref 3.87–5.11)
RDW: 17.7 % — AB (ref 11.5–15.5)
WBC: 10.2 10*3/uL (ref 4.0–10.5)

## 2014-11-08 LAB — COMPREHENSIVE METABOLIC PANEL
ALBUMIN: 4.3 g/dL (ref 3.5–5.0)
ALT: 35 U/L (ref 14–54)
AST: 47 U/L — AB (ref 15–41)
Alkaline Phosphatase: 113 U/L (ref 38–126)
Anion gap: 6 (ref 5–15)
BUN: 9 mg/dL (ref 6–20)
CHLORIDE: 112 mmol/L — AB (ref 101–111)
CO2: 19 mmol/L — AB (ref 22–32)
Calcium: 8.9 mg/dL (ref 8.9–10.3)
Creatinine, Ser: 0.39 mg/dL — ABNORMAL LOW (ref 0.44–1.00)
GFR calc Af Amer: >60 mL/min (ref >60)
Glucose, Bld: 97 mg/dL (ref 65–99)
POTASSIUM: 3.5 mmol/L (ref 3.5–5.1)
SODIUM: 137 mmol/L (ref 135–145)
Total Bilirubin: 2.2 mg/dL — ABNORMAL HIGH (ref 0.3–1.2)
Total Protein: 8 g/dL (ref 6.5–8.1)

## 2014-11-08 LAB — LACTATE DEHYDROGENASE: LDH: 230 U/L — AB (ref 98–192)

## 2014-11-08 MED ORDER — SODIUM CHLORIDE 0.9 % IJ SOLN: 9.0000 mL | INTRAMUSCULAR | Status: DC | PRN

## 2014-11-08 MED ORDER — HYDROMORPHONE 2 MG/ML HIGH CONCENTRATION IV PCA SOLN
INTRAVENOUS | Status: DC
  Administered 2014-11-08: 12:00:00 via INTRAVENOUS
  Administered 2014-11-08: 1 mg via INTRAVENOUS
  Administered 2014-11-08: 11 mg via INTRAVENOUS
  Filled 2014-11-08: qty 25

## 2014-11-08 MED ORDER — ONDANSETRON HCL 4 MG/2ML IJ SOLN: 4.0000 mg | Freq: Four times a day (QID) | INTRAMUSCULAR | Status: DC | PRN

## 2014-11-08 MED ORDER — KETOROLAC TROMETHAMINE 30 MG/ML IJ SOLN
30.0000 mg | Freq: Four times a day (QID) | INTRAMUSCULAR | Status: DC
  Administered 2014-11-08: 30 mg via INTRAVENOUS
  Filled 2014-11-08: qty 1

## 2014-11-08 MED ORDER — SODIUM CHLORIDE 0.9 % IJ SOLN
10.0000 mL | INTRAMUSCULAR | Status: AC | PRN
  Administered 2014-11-08: 10 mL

## 2014-11-08 MED ORDER — HEPARIN SOD (PORK) LOCK FLUSH 100 UNIT/ML IV SOLN
500.0000 [IU] | INTRAVENOUS | Status: AC | PRN
  Administered 2014-11-08: 500 [IU]
  Filled 2014-11-08: qty 5

## 2014-11-08 MED ORDER — DEXTROSE-NACL 5-0.45 % IV SOLN
INTRAVENOUS | Status: DC
  Administered 2014-11-08: 12:00:00 via INTRAVENOUS

## 2014-11-08 MED ORDER — NALOXONE HCL 0.4 MG/ML IJ SOLN: 0.4000 mg | INTRAMUSCULAR | Status: DC | PRN

## 2014-11-08 MED ORDER — SODIUM CHLORIDE 0.9 % IV SOLN
12.5000 mg | Freq: Four times a day (QID) | INTRAVENOUS | Status: DC | PRN
  Administered 2014-11-08: 12.5 mg via INTRAVENOUS
  Filled 2014-11-08 (×2): qty 0.25

## 2014-11-08 MED ORDER — DIPHENHYDRAMINE HCL 12.5 MG/5ML PO ELIX: 12.5000 mg | ORAL_SOLUTION | Freq: Four times a day (QID) | ORAL | Status: DC | PRN

## 2014-11-08 MED ORDER — OXYCODONE HCL 5 MG PO TABS
5.0000 mg | ORAL_TABLET | Freq: Once | ORAL | Status: AC
  Administered 2014-11-08: 5 mg via ORAL
  Filled 2014-11-08: qty 1

## 2014-11-08 MED ORDER — OXYCODONE-ACETAMINOPHEN 5-325 MG PO TABS
1.0000 | ORAL_TABLET | Freq: Once | ORAL | Status: AC
  Administered 2014-11-08: 1 via ORAL
  Filled 2014-11-08: qty 1

## 2014-11-08 NOTE — Discharge Instructions (Signed)
Reminded patient of appointment on 11/09/2014 at 10:30 Sickle Cell Anemia, Adult Sickle cell anemia is a condition in which red blood cells have an abnormal "sickle" shape. This abnormal shape shortens the cells' life span, which results in a lower than normal concentration of red blood cells in the blood. The sickle shape also causes the cells to clump together and block free blood flow through the blood vessels. As a result, the tissues and organs of the body do not receive enough oxygen. Sickle cell anemia causes organ damage and pain and increases the risk of infection. CAUSES  Sickle cell anemia is a genetic disorder. Those who receive two copies of the gene have the condition, and those who receive one copy have the trait. RISK FACTORS The sickle cell gene is most common in people whose families originated in [place]. Other areas of the globe where sickle cell trait occurs include the [place], [place], the [place], and the [place].  SIGNS AND SYMPTOMS  Pain, especially in the extremities, back, chest, or abdomen (common). The pain may start suddenly or may develop following an illness, especially if there is dehydration. Pain can also occur due to overexertion or exposure to extreme temperature changes.  Frequent severe bacterial infections, especially certain types of pneumonia and meningitis.  Pain and swelling in the hands and feet.  Decreased activity.   Loss of appetite.   Change in behavior.  Headaches.  Seizures.  Shortness of breath or difficulty breathing.  Vision changes.  Skin ulcers. Those with the trait may not have symptoms or they may have mild symptoms.  DIAGNOSIS  Sickle cell anemia is diagnosed with blood tests that demonstrate the genetic trait. It is often diagnosed during the newborn period, due to mandatory testing nationwide. A variety of blood tests, X-rays, CT scans, MRI scans, ultrasounds, and lung function tests may also be  done to monitor the condition. TREATMENT  Sickle cell anemia may be treated with:  Medicines. You may be given pain medicines, antibiotic medicines (to treat and prevent infections) or medicines to increase the production of certain types of hemoglobin.  Fluids.  Oxygen.  Blood transfusions. HOME CARE INSTRUCTIONS   Drink enough fluid to keep your urine clear or pale yellow. Increase your fluid intake in hot weather and during exercise.  Do not smoke. Smoking lowers oxygen levels in the blood.   Only take over-the-counter or prescription medicines for pain, fever, or discomfort as directed by your health care provider.  Take antibiotics as directed by your health care provider. Make sure you finish them it even if you start to feel better.   Take supplements as directed by your health care provider.   Consider wearing a medical alert bracelet. This tells anyone caring for you in an emergency of your condition.   When traveling, keep your medical information, health care provider's names, and the medicines you take with you at all times.   If you develop a fever, do not take medicines to reduce the fever right away. This could cover up a problem that is developing. Notify your health care provider.  Keep all follow-up appointments with your health care provider. Sickle cell anemia requires regular medical care. SEEK MEDICAL CARE IF: You have a fever. SEEK IMMEDIATE MEDICAL CARE IF:   You feel dizzy or faint.   You have new abdominal pain, especially on the left side near the stomach area.   You develop a persistent, often uncomfortable and painful penile erection (  priapism). If this is not treated immediately it will lead to impotence.   You have numbness your arms or legs or you have a hard time moving them.   You have a hard time with speech.   You have a fever or persistent symptoms for more than 2-3 days.   You have a fever and your symptoms suddenly get  worse.   You have signs or symptoms of infection. These include:   Chills.   Abnormal tiredness (lethargy).   Irritability.   Poor eating.   Vomiting.   You develop pain that is not helped with medicine.   You develop shortness of breath.  You have pain in your chest.   You are coughing up pus-like or bloody sputum.   You develop a stiff neck.  Your feet or hands swell or have pain.  Your abdomen appears bloated.  You develop joint pain. MAKE SURE YOU:  Understand these instructions. Document Released: 05/27/2005 Document Revised: 07/03/2013 Document Reviewed: 09/28/2012 [facility] Patient Information 2015 [place], [place]. This information is not intended to replace advice given to you by your health care provider. Make sure you discuss any questions you have with your health care provider.  Sickle Cell Anemia, Adult Sickle cell anemia is a condition in which red blood cells have an abnormal "sickle" shape. This abnormal shape shortens the cells' life span, which results in a lower than normal concentration of red blood cells in the blood. The sickle shape also causes the cells to clump together and block free blood flow through the blood vessels. As a result, the tissues and organs of the body do not receive enough oxygen. Sickle cell anemia causes organ damage and pain and increases the risk of infection. CAUSES  Sickle cell anemia is a genetic disorder. Those who receive two copies of the gene have the condition, and those who receive one copy have the trait. RISK FACTORS The sickle cell gene is most common in people whose families originated in [place]. Other areas of the globe where sickle cell trait occurs include the [place], [place], the [place], and the [place].  SIGNS AND SYMPTOMS  Pain, especially in the extremities, back, chest, or abdomen (common). The pain may start suddenly or may develop following an illness, especially  if there is dehydration. Pain can also occur due to overexertion or exposure to extreme temperature changes.  Frequent severe bacterial infections, especially certain types of pneumonia and meningitis.  Pain and swelling in the hands and feet.  Decreased activity.   Loss of appetite.   Change in behavior.  Headaches.  Seizures.  Shortness of breath or difficulty breathing.  Vision changes.  Skin ulcers. Those with the trait may not have symptoms or they may have mild symptoms.  DIAGNOSIS  Sickle cell anemia is diagnosed with blood tests that demonstrate the genetic trait. It is often diagnosed during the newborn period, due to mandatory testing nationwide. A variety of blood tests, X-rays, CT scans, MRI scans, ultrasounds, and lung function tests may also be done to monitor the condition. TREATMENT  Sickle cell anemia may be treated with:  Medicines. You may be given pain medicines, antibiotic medicines (to treat and prevent infections) or medicines to increase the production of certain types of hemoglobin.  Fluids.  Oxygen.  Blood transfusions. HOME CARE INSTRUCTIONS   Drink enough fluid to keep your urine clear or pale yellow. Increase your fluid intake in hot weather and during exercise.  Do not smoke.  Smoking lowers oxygen levels in the blood.   Only take over-the-counter or prescription medicines for pain, fever, or discomfort as directed by your health care provider.  Take antibiotics as directed by your health care provider. Make sure you finish them it even if you start to feel better.   Take supplements as directed by your health care provider.   Consider wearing a medical alert bracelet. This tells anyone caring for you in an emergency of your condition.   When traveling, keep your medical information, health care provider's names, and the medicines you take with you at all times.   If you develop a fever, do not take medicines to reduce the fever  right away. This could cover up a problem that is developing. Notify your health care provider.  Keep all follow-up appointments with your health care provider. Sickle cell anemia requires regular medical care. SEEK MEDICAL CARE IF: You have a fever. SEEK IMMEDIATE MEDICAL CARE IF:   You feel dizzy or faint.   You have new abdominal pain, especially on the left side near the stomach area.   You develop a persistent, often uncomfortable and painful penile erection (priapism). If this is not treated immediately it will lead to impotence.   You have numbness your arms or legs or you have a hard time moving them.   You have a hard time with speech.   You have a fever or persistent symptoms for more than 2-3 days.   You have a fever and your symptoms suddenly get worse.   You have signs or symptoms of infection. These include:   Chills.   Abnormal tiredness (lethargy).   Irritability.   Poor eating.   Vomiting.   You develop pain that is not helped with medicine.   You develop shortness of breath.  You have pain in your chest.   You are coughing up pus-like or bloody sputum.   You develop a stiff neck.  Your feet or hands swell or have pain.  Your abdomen appears bloated.  You develop joint pain. MAKE SURE YOU:  Understand these instructions. Document Released: 05/27/2005 Document Revised: 07/03/2013 Document Reviewed: 09/28/2012 [facility] Patient Information 2015 [place], [place]. This information is not intended to replace advice given to you by your health care provider. Make sure you discuss any questions you have with your health care provider.  Sickle Cell Anemia Sickle cell anemia is a condition where your red blood cells are shaped like sickles. Red blood cells carry oxygen through the body. Sickle-shaped red blood cells do not live as long as normal red blood cells. They also clump together and block blood from flowing through the blood  vessels. These things prevent the body from getting enough oxygen. Sickle cell anemia causes organ damage and pain. It also increases the risk of infection. HOME CARE  Drink enough fluid to keep your pee (urine) clear or pale yellow. Drink more in hot weather and during exercise.  Do not smoke. Smoking lowers oxygen levels in the blood.  Only take over-the-counter or prescription medicines as told by your doctor.  Take antibiotic medicines as told by your doctor. Make sure you finish them even if you start to feel better.  Take supplements as told by your doctor.  Consider wearing a medical alert bracelet. This tells anyone caring for you in an emergency of your condition.  When traveling, keep your medical information, doctors' names, and the medicines you take with you at all times.  If you  have a fever, do not take fever medicines right away. This could cover up a problem. Tell your doctor.   Keep all follow-up visits with your doctor. Sickle cell anemia requires regular medical care. GET HELP IF: You have a fever. GET HELP RIGHT AWAY IF:  You feel dizzy or faint.  You have new belly (abdominal) pain, especially on the left side near the stomach area.  You have a lasting, often uncomfortable and painful erection of the penis (priapism). If it is not treated right away, you will become unable to have sex (impotence).  You have numbness in your arms or legs or you have a hard time moving them.  You have a hard time talking.  You have a fever or lasting symptoms for more than 2-3 days.  You have a fever and your symptoms suddenly get worse.  You have signs or symptoms of infection. These include:  Chills.  Being more tired than normal (lethargy).  Irritability.  Poor eating.  Throwing up (vomiting).  You have pain that is not helped with medicine.  You have shortness of breath.  You have pain in your chest.  You are coughing up pus-like or bloody mucus.  You  have a stiff neck.  Your feet or hands swell or have pain.  Your belly looks bloated.  Your joints hurt. MAKE SURE YOU:  Understand these instructions.  Will watch your condition.  Will get help right away if you are not doing well or get worse. Document Released: 12/07/2012 Document Revised: 07/03/2013 Document Reviewed: 12/07/2012 [facility] Patient Information 2015 [place], [place]. This information is not intended to replace advice given to you by your health care provider. Make sure you discuss any questions you have with your health care provider.

## 2014-11-08 NOTE — Telephone Encounter (Signed)
Pt called and states experiencing pain in ribs and hips; states pain 8/10 and states has taken home pain medications with no relief; pt denies CP, SOB, nausea, vomiting, or diarrhea; NP notified and pt informed that she may come to day hospital for evaluation; pt verbalizes understanding

## 2014-11-08 NOTE — Discharge Summary (Signed)
  [facility] Discharge Summary   Patient ID: Brittany [place] MRN: [id number] DOB/AGE: 31-Mar-1985 29 y.o.  Admit date: 11/08/2014 Discharge date: 11/08/2014  Primary Care Physician:  Dr. Gerre Pebbles  Admission Diagnoses:  Active Problems:   Sickle-cell disease with pain   Discharge Medications:    Medication List    TAKE these medications        ALEVE 220 MG Caps  Generic drug:  Naproxen Sodium  Take 660 mg by mouth every 6 (six) hours as needed (pain).     DULoxetine 30 MG capsule  Commonly known as:  CYMBALTA  Take 30 mg by mouth daily.     folic acid 1 MG tablet  Commonly known as:  FOLVITE  Take 1 tablet (1 mg total) by mouth daily.     gabapentin 300 MG capsule  Commonly known as:  NEURONTIN  Take 1 capsule (300 mg total) by mouth 3 (three) times daily.     HYDROcodone-acetaminophen 10-325 MG per tablet  Commonly known as:  NORCO  Take 1 tablet by mouth 3 (three) times daily as needed.     hydroxyurea 500 MG capsule  Commonly known as:  HYDREA  Take 2 capsules (1,000 mg total) by mouth daily. May take with food to minimize GI side effects.     oxyCODONE-acetaminophen 10-325 MG per tablet  Commonly known as:  PERCOCET  Take 1 tablet by mouth every 6 (six) hours as needed for pain.     potassium chloride SA 20 MEQ tablet  Commonly known as:  K-DUR,KLOR-CON  Take 1 tablet (20 mEq total) by mouth daily.     Topiramate ER 100 MG Cp24  Take 100 mg by mouth at bedtime.     Vitamin D3 5000 UNITS Caps  Take 1 capsule (5,000 Units total) by mouth daily.         Consults:  None  Significant Diagnostic Studies:  No results found.   [facility] Course: [name] was admitted to day infusion center for extended observation. Patient was started on high concentration PCA per weight based protocol. She used a total of 12 mg of dilaudid with 28 demands and 24 deliveries. [name] was given Oxycodone-acetaminophen 30 minutes prior to  discharge. Pain intensity was 8/10 prior to discharge. [name] is alert, oriented, and ambulatory. Patient will discharge home in stable condition. She will follow-up in primary care on 11/09/2014.    Physical Exam at Discharge:  BP 95/62 mmHg  Pulse 84  Temp(Src) 98.7 F (37.1 C) (Oral)  Resp 15  SpO2 98%  LMP 10/15/2014 (Approximate)   Disposition at Discharge: 01-Home or Self Care  Discharge Orders:     Discharge Instructions    Discharge patient    Complete by:  As directed            Condition at Discharge:   Stable  Time spent on Discharge:  Greater than 30 minutes.  Signed: Farzana Koci M 11/08/2014, 4:10 PM

## 2014-11-08 NOTE — Progress Notes (Signed)
Patient ID: Brittany [place], female   DOB: 31-Aug-1985, 29 y.o.   MRN: [id number] Discharge instructions given to patient, and she verbalizes understanding.  Port flushed and de-accessed per protocol.  Questions answered.  Patient states pain is currently 6/10 on pain scale.

## 2014-11-08 NOTE — H&P (Signed)
[facility] History and Physical   Date: 11/08/2014  Patient name: Brittany [place] Medical record number: [id number] Date of birth: 09-17-1985 Age: 29 y.o. Gender: female PCP: [name],[name], MD  Attending physician: Quentin Angst, MD  Chief Complaint: Generalized pain History of Present Illness: Brittany [place], a 29 year old female with a history of sickle cell anemia, HbSS presents with generalized pain for 1 day. Ms. Pay attributes current pain crisis to changes in weather. Patient's current pain intensity is 9/10, primarily in back, shoulder, and lower extremities. She states that she last had Norco 10-325 mg at 7 am with minimal relief. She current denies fatigue, headache, chest pains, abdominal pain, nausea, vomiting or diarrhea. Will admit to day infusion center for extended observation.   Meds: Prescriptions prior to admission  Medication Sig Dispense Refill Last Dose  . Cholecalciferol (VITAMIN D3) 5000 UNITS CAPS Take 1 capsule (5,000 Units total) by mouth daily. 30 capsule 5 Unknown at Unknown time  . DULoxetine (CYMBALTA) 30 MG capsule Take 30 mg by mouth daily.   Past Week at Unknown time  . folic acid (FOLVITE) 1 MG tablet Take 1 tablet (1 mg total) by mouth daily. 30 tablet 11 Past Week at Unknown time  . gabapentin (NEURONTIN) 300 MG capsule Take 1 capsule (300 mg total) by mouth 3 (three) times daily. 90 capsule 2 Past Week at Unknown time  . HYDROcodone-acetaminophen (NORCO) 10-325 MG per tablet Take 1 tablet by mouth 3 (three) times daily as needed.   Unknown at Unknown time  . hydroxyurea (HYDREA) 500 MG capsule Take 2 capsules (1,000 mg total) by mouth daily. May take with food to minimize GI side effects. 60 capsule 3 Past Week at Unknown time  . Naproxen Sodium (ALEVE) 220 MG CAPS Take 660 mg by mouth every 6 (six) hours as needed (pain).   10/28/2014 at Unknown time  . oxyCODONE-acetaminophen (PERCOCET) 10-325 MG per tablet Take 1 tablet by  mouth every 6 (six) hours as needed for pain. 100 tablet 0 10/29/2014 at Unknown time  . potassium chloride SA (K-DUR,KLOR-CON) 20 MEQ tablet Take 1 tablet (20 mEq total) by mouth daily. 30 tablet 1 Unknown at Unknown time  . Topiramate ER 100 MG CP24 Take 100 mg by mouth at bedtime.   10/28/2014 at Unknown time    Allergies: Ultram and Morphine and related Past Medical History  Diagnosis Date  . Sickle cell anemia    Past Surgical History  Procedure Laterality Date  . Tubal ligation    . Cholecystectomy    . Cesarean section    . Port a cath placement Right     about 6-7 years ago  . Cholecystectomy  2000   Family History  Problem Relation Age of Onset  . Sickle cell anemia Other   . Sickle cell trait Father   . Sickle cell trait Mother    Social History   Social History  . Marital Status: Single    Spouse Name: N/A  . Number of Children: N/A  . Years of Education: N/A   Occupational History  . Not on file.   Social History Main Topics  . Smoking status: Former Games developer  . Smokeless tobacco: Not on file     Comment: smokes once every couple of weeks.   . Alcohol Use: No  . Drug Use: No  . Sexual Activity: Yes    Birth Control/ Protection: Surgical   Other Topics Concern  . Not  on file   Social History Narrative    Review of Systems: Constitutional: negative Eyes: negative Ears, nose, mouth, throat, and face: negative Respiratory: negative Cardiovascular: negative Gastrointestinal: negative Genitourinary:negative Integument/breast: negative Hematologic/lymphatic: negative Musculoskeletal:negative Neurological: negative Behavioral/Psych: negative Endocrine: negative Allergic/Immunologic: negative  Physical Exam: Blood pressure 117/60, pulse 103, temperature 98.9 F (37.2 C), temperature source Oral, resp. rate 16, last menstrual period 10/15/2014, SpO2 99 %. BP 117/60 mmHg  Pulse 103  Temp(Src) 98.9 F (37.2 C) (Oral)  Resp 16  SpO2 99%  LMP  10/15/2014 (Approximate)  General Appearance:    Alert, cooperative, no distress, appears stated age  Head:    Normocephalic, without obvious abnormality, atraumatic  Eyes:    PERRL, conjunctiva/corneas clear, EOM's intact, fundi    benign, both eyes  Ears:    Normal TM's and external ear canals, both ears  Nose:   Nares normal, septum midline, mucosa normal, no drainage    or sinus tenderness  Throat:   Lips, mucosa, and tongue normal; teeth partially edentulous gums normal  Neck:   Supple, symmetrical, trachea midline, no adenopathy;    thyroid:  no enlargement/tenderness/nodules; no carotid   bruit or JVD  Back:     Symmetric, no curvature, ROM normal, no CVA tenderness  Lungs:     Clear to auscultation bilaterally, respirations unlabored  Chest Wall:    No tenderness or deformity   Heart:    Regular rate and rhythm, S1 and S2 normal, no murmur, rub   or gallop     Abdomen:     Soft, non-tender, bowel sounds active all four quadrants,    no masses, no organomegaly  Extremities:   Extremities normal, atraumatic, no cyanosis or edema  Pulses:   2+ and symmetric all extremities  Skin:   Skin color, texture, turgor normal, no rashes or lesions  Lymph nodes:   Cervical, supraclavicular, and axillary nodes normal  Neurologic:   CNII-XII intact, normal strength, sensation and reflexes    throughout    Lab results: No results found for this or any previous visit (from the past 24 hour(s)).  Imaging results:  No results found.   Assessment & Plan:  Brittany [place] will be admitted to the day infusion center for extended observation.   Start high concentration PCA via weight based protocol, patient is opiate tolerant  Start Toradol 30 mg IV every 6 hours for inflammation   Labs: Will check CBC w/ differerntial, LDH, and CMP  Will start IV fluids  D 5.45 for cellular rehydration.   Patient will be re-evaluated for pain in the context of function and relationship to baseline as care  progresses Deronte Solis M 11/08/2014, 11:13 AM

## 2014-11-09 ENCOUNTER — Ambulatory Visit (INDEPENDENT_AMBULATORY_CARE_PROVIDER_SITE_OTHER): Payer: Medicaid Other | Admitting: Family Medicine

## 2014-11-09 ENCOUNTER — Encounter: Payer: Self-pay | Admitting: Family Medicine

## 2014-11-09 VITALS — BP 111/74 | HR 105 | Temp 98.6°F | Resp 14 | Ht 60.0 in | Wt 114.0 lb

## 2014-11-09 DIAGNOSIS — F329 Major depressive disorder, single episode, unspecified: Secondary | ICD-10-CM

## 2014-11-09 DIAGNOSIS — F32A Depression, unspecified: Secondary | ICD-10-CM

## 2014-11-09 DIAGNOSIS — D571 Sickle-cell disease without crisis: Secondary | ICD-10-CM

## 2014-11-09 LAB — POCT URINALYSIS DIP (DEVICE)
Bilirubin Urine: NEGATIVE
GLUCOSE, UA: NEGATIVE mg/dL
Hgb urine dipstick: NEGATIVE
Ketones, ur: NEGATIVE mg/dL
LEUKOCYTES UA: NEGATIVE
NITRITE: NEGATIVE
Protein, ur: NEGATIVE mg/dL
Specific Gravity, Urine: 1.015 (ref 1.005–1.030)
UROBILINOGEN UA: 0.2 mg/dL (ref 0.0–1.0)
pH: 6 (ref 5.0–8.0)

## 2014-11-09 MED ORDER — DULOXETINE HCL 30 MG PO CPEP: 30.0000 mg | ORAL_CAPSULE | Freq: Every day | ORAL | Status: DC

## 2014-11-09 MED ORDER — FOLIC ACID 1 MG PO TABS: 1.0000 mg | ORAL_TABLET | Freq: Every day | ORAL | Status: DC

## 2014-11-09 MED ORDER — HYDROXYUREA 500 MG PO CAPS: 1000.0000 mg | ORAL_CAPSULE | Freq: Every day | ORAL | Status: DC

## 2014-11-09 NOTE — Progress Notes (Signed)
Subjective:    Patient ID: Brittany Foster, female    DOB: 1986/02/15, 29 y.o.   MRN: [id number]  HPI [name], a 29 year old female with a history of sickle cell anemia, HbSS presents for a follow up of sickle cell anemia and medication management. Brittany Foster was admitted to the [facility] . Brittany Foster states that she has been taking medications consistently and pain intensity has improved minimally.  She maintains that she is having pain in ribs and hips. Patient states that she last had Percocet around 7 am.   Brittany Foster also reports a history of depression. She has been taking ant-depressant . She also reports that she was able to speak with a counselor while hospitalized.  She complains of periodic anhedonia and depressed mood.  She denies current suicidal and homicidal plan or intent.     Brittany Foster currently denies fatigue, headache, shortness of breath, chest pain, dysuria, nausea, vomiting, or diarrhea.   Past Medical History  Diagnosis Date  . Sickle cell anemia    Social History   Social History  . Marital Status: Single    Spouse Name: N/A  . Number of Children: N/A  . Years of Education: N/A   Occupational History  . Not on file.   Social History Main Topics  . Smoking status: Former Games developer  . Smokeless tobacco: Not on file     Comment: smokes once every couple of weeks.   . Alcohol Use: No  . Drug Use: No  . Sexual Activity: Yes    Birth Control/ Protection: Surgical   Other Topics Concern  . Not on file   Social History Narrative   Review of Systems  Constitutional: Negative.  Negative for fever and unexpected weight change.  Eyes: Negative.  Negative for photophobia and visual disturbance.  Respiratory: Negative.   Cardiovascular: Negative.   Gastrointestinal: Negative.   Endocrine: Negative.   Genitourinary: Negative.   Musculoskeletal: Positive for myalgias (hip pain, ribs and low back).  Skin: Negative.   Allergic/Immunologic:  Negative.   Neurological: Negative.   Hematological: Negative.   Psychiatric/Behavioral: Negative.         Objective:   Physical Exam  Constitutional: She is oriented to person, place, and time. She appears well-developed and well-nourished.  HENT:  Head: Normocephalic and atraumatic.  Right Ear: External ear normal.  Left Ear: External ear normal.  Mouth/Throat: Oropharynx is clear and moist.  Eyes: Conjunctivae are normal. Pupils are equal, round, and reactive to light.  Neck: Normal range of motion. Neck supple.  Pulmonary/Chest: Effort normal and breath sounds normal.  Abdominal: Soft. Bowel sounds are normal.  Musculoskeletal: Normal range of motion.  Neurological: She is alert and oriented to person, place, and time. She has normal reflexes.  Skin: Skin is warm and dry.  Psychiatric: She has a normal mood and affect. Her behavior is normal. Judgment and thought content normal.       BP 111/74 mmHg  Pulse 105  Temp(Src) 98.6 F (37 C) (Oral)  Resp 14  Ht 5' (1.524 m)  Wt 114 lb (51.71 kg)  BMI 22.26 kg/m2  LMP 10/15/2014 (Approximate)  Assessment & Plan:    1. Hb-SS disease without crisis Sickle cell disease - Patient is currently on Hydroxyurea 1000 mg daily. Patient states that she had been taking medication consistently. We discussed the need for good hydration, monitoring of hydration status, avoidance of heat, cold, stress, and infection triggers. We discussed  the risks and benefits of Hydrea, including bone marrow suppression, the possibility of GI upset, skin ulcers, hair thinning, and teratogenicity. Discussed using barrier protection with sexual intercourse.  The patient was reminded of the need to seek medical attention of any symptoms of bleeding, anemia, or infection.  Continue folic acid 1 mg daily to prevent aplastic bone marrow crises.   Screened urine for proteinuria, negative.   Pulmonary evaluation - Patient denies severe recurrent wheezes, shortness  of breath with exercise, or persistent cough. If these symptoms develop, pulmonary function tests with spirometry will be ordered, and if abnormal, plan on referral to Pulmonology for further evaluation.  Cardiac - Routine screening for pulmonary hypertension is not recommended.  Eye - High risk of proliferative retinopathy. Annual eye exam with retinal exam recommended to patient. Sent referral in June, patient missed scheduled appointment. She was told to re-schedule appointment.   Immunization status -  Reviewed vaccination. Patient refused influenza vaccination on today   Iron overload from chronic transfusion.  Patient is not transfusion dependent upon review of past medical history  Vitamin D deficiency - Reviewed previous vitamin D level, vitamin D level is 8; will continue vitamin D. Patient refuses a lab draw.  - folic acid (FOLVITE) 1 MG tablet; Take 1 tablet (1 mg total) by mouth daily.  Dispense: 30 tablet; Refill: 11 - hydroxyurea (HYDREA) 500 MG capsule; Take 2 capsules (1,000 mg total) by mouth daily. May take with food to minimize GI side effects.  Dispense: 60 capsule; Refill: 3   2. Depression Brittany Foster was recently started on Cymbalta 30 mg for depression. She states that she is taking medication consistently. Patient has been referred to counseling previously, but did not show up for scheduled appointment.  Reviewed current PHQ-9 screen (2).  She states that symptoms of depression have improved.  Patient denies suicidal or homicidal ideations.   - DULoxetine (CYMBALTA) 30 MG capsule; Take 1 capsule (30 mg total) by mouth daily.  Dispense: 30 capsule; Refill: 3     RTC: 3 months Maxmilian Trostel M, FNP

## 2014-11-09 NOTE — Patient Instructions (Signed)
Sickle Cell Anemia, Adult °Sickle cell anemia is a condition in which red blood cells have an abnormal "sickle" shape. This abnormal shape shortens the cells' life span, which results in a lower than normal concentration of red blood cells in the blood. The sickle shape also causes the cells to clump together and block free blood flow through the blood vessels. As a result, the tissues and organs of the body do not receive enough oxygen. Sickle cell anemia causes organ damage and pain and increases the risk of infection. °CAUSES  °Sickle cell anemia is a genetic disorder. Those who receive two copies of the gene have the condition, and those who receive one copy have the trait. °RISK FACTORS °The sickle cell gene is most common in people whose families originated in [place]. Other areas of the globe where sickle cell trait occurs include the Mediterranean, [place] and [place], the [place], and the [place].  °SIGNS AND SYMPTOMS °· Pain, especially in the extremities, back, chest, or abdomen (common). The pain may start suddenly or may develop following an illness, especially if there is dehydration. Pain can also occur due to overexertion or exposure to extreme temperature changes. °· Frequent severe bacterial infections, especially certain types of pneumonia and meningitis. °· Pain and swelling in the hands and feet. °· Decreased activity.   °· Loss of appetite.   °· Change in behavior. °· Headaches. °· Seizures. °· Shortness of breath or difficulty breathing. °· Vision changes. °· Skin ulcers. °Those with the trait may not have symptoms or they may have mild symptoms.  °DIAGNOSIS  °Sickle cell anemia is diagnosed with blood tests that demonstrate the genetic trait. It is often diagnosed during the newborn period, due to mandatory testing nationwide. A variety of blood tests, X-rays, CT scans, MRI scans, ultrasounds, and lung function tests may also be done to monitor the condition. °TREATMENT  °Sickle  cell anemia may be treated with: °· Medicines. You may be given pain medicines, antibiotic medicines (to treat and prevent infections) or medicines to increase the production of certain types of hemoglobin. °· Fluids. °· Oxygen. °· Blood transfusions. °HOME CARE INSTRUCTIONS  °· Drink enough fluid to keep your urine clear or pale yellow. Increase your fluid intake in hot weather and during exercise. °· Do not smoke. Smoking lowers oxygen levels in the blood.   °· Only take over-the-counter or prescription medicines for pain, fever, or discomfort as directed by your health care provider. °· Take antibiotics as directed by your health care provider. Make sure you finish them it even if you start to feel better.   °· Take supplements as directed by your health care provider.   °· Consider wearing a medical alert bracelet. This tells anyone caring for you in an emergency of your condition.   °· When traveling, keep your medical information, health care provider's names, and the medicines you take with you at all times.   °· If you develop a fever, do not take medicines to reduce the fever right away. This could cover up a problem that is developing. Notify your health care provider. °· Keep all follow-up appointments with your health care provider. Sickle cell anemia requires regular medical care. °SEEK MEDICAL CARE IF: ° You have a fever. °SEEK IMMEDIATE MEDICAL CARE IF:  °· You feel dizzy or faint.   °· You have new abdominal pain, especially on the left side near the stomach area.   °· You develop a persistent, often uncomfortable and painful penile erection (priapism). If this is not treated immediately it   will lead to impotence.   °· You have numbness your arms or legs or you have a hard time moving them.   °· You have a hard time with speech.   °· You have a fever or persistent symptoms for more than 2-3 days.   °· You have a fever and your symptoms suddenly get worse.   °· You have signs or symptoms of infection.  These include:   °¨ Chills.   °¨ Abnormal tiredness (lethargy).   °¨ Irritability.   °¨ Poor eating.   °¨ Vomiting.   °· You develop pain that is not helped with medicine.   °· You develop shortness of breath. °· You have pain in your chest.   °· You are coughing up pus-like or bloody sputum.   °· You develop a stiff neck. °· Your feet or hands swell or have pain. °· Your abdomen appears bloated. °· You develop joint pain. °MAKE SURE YOU: °· Understand these instructions. °Document Released: 05/27/2005 Document Revised: 07/03/2013 Document Reviewed: 09/28/2012 °ExitCare® Patient Information ©2015 [employer]. This information is not intended to replace advice given to you by your health care provider. Make sure you discuss any questions you have with your health care provider. ° °

## 2014-11-12 ENCOUNTER — Telehealth (HOSPITAL_COMMUNITY): Payer: Self-pay | Admitting: *Deleted

## 2014-11-12 NOTE — Telephone Encounter (Signed)
Pt called in to a [facility] reporting abdominal pain, leg and back pain. Pt requested to come in. Patient had been seen at the clinic side on Friday with evaluation and previously to that on Thursday she had been seen on the acute side with treatment. Patient stated that she is moving her care elsewhere and she is not going to make another appointment on the clinic side. Provider was notified of patient's request and she was advised to be seen in the emergency department. Pt hang up the phone without any answer.

## 2014-11-13 ENCOUNTER — Telehealth (HOSPITAL_COMMUNITY): Payer: Self-pay | Admitting: Internal Medicine

## 2014-11-13 ENCOUNTER — Encounter (HOSPITAL_COMMUNITY): Payer: Self-pay | Admitting: Hematology

## 2014-11-13 ENCOUNTER — Other Ambulatory Visit: Payer: Self-pay | Admitting: Internal Medicine

## 2014-11-13 ENCOUNTER — Non-Acute Institutional Stay (HOSPITAL_COMMUNITY)
Admission: AD | Admit: 2014-11-13 | Discharge: 2014-11-13 | Disposition: A | Discharge status: Home / Self Care | Payer: Medicaid Other | Source: Ambulatory Visit | Attending: Internal Medicine | Admitting: Internal Medicine

## 2014-11-13 DIAGNOSIS — N342 Other urethritis: Secondary | ICD-10-CM

## 2014-11-13 DIAGNOSIS — A599 Trichomoniasis, unspecified: Secondary | ICD-10-CM

## 2014-11-13 DIAGNOSIS — Z791 Long term (current) use of non-steroidal anti-inflammatories (NSAID): Secondary | ICD-10-CM | POA: Diagnosis not present

## 2014-11-13 DIAGNOSIS — D57819 Other sickle-cell disorders with crisis, unspecified: Secondary | ICD-10-CM | POA: Diagnosis not present

## 2014-11-13 DIAGNOSIS — Z79891 Long term (current) use of opiate analgesic: Secondary | ICD-10-CM | POA: Diagnosis not present

## 2014-11-13 DIAGNOSIS — D5700 Hb-SS disease with crisis, unspecified: Secondary | ICD-10-CM | POA: Diagnosis present

## 2014-11-13 DIAGNOSIS — Z79899 Other long term (current) drug therapy: Secondary | ICD-10-CM | POA: Insufficient documentation

## 2014-11-13 DIAGNOSIS — R52 Pain, unspecified: Secondary | ICD-10-CM | POA: Diagnosis present

## 2014-11-13 LAB — URINE MICROSCOPIC-ADD ON

## 2014-11-13 LAB — URINALYSIS, ROUTINE W REFLEX MICROSCOPIC
Bilirubin Urine: NEGATIVE
Glucose, UA: NEGATIVE mg/dL
Hgb urine dipstick: NEGATIVE
Ketones, ur: NEGATIVE mg/dL
NITRITE: NEGATIVE
Protein, ur: NEGATIVE mg/dL
SPECIFIC GRAVITY, URINE: 1.012 (ref 1.005–1.030)
UROBILINOGEN UA: 1 mg/dL (ref 0.0–1.0)
pH: 6.5 (ref 5.0–8.0)

## 2014-11-13 MED ORDER — SULFAMETHOXAZOLE-TRIMETHOPRIM 800-160 MG PO TABS: 1.0000 | ORAL_TABLET | Freq: Two times a day (BID) | ORAL | Status: AC

## 2014-11-13 MED ORDER — HEPARIN SOD (PORK) LOCK FLUSH 100 UNIT/ML IV SOLN
500.0000 [IU] | INTRAVENOUS | Status: AC | PRN
  Administered 2014-11-13: 500 [IU]
  Filled 2014-11-13: qty 5

## 2014-11-13 MED ORDER — DIPHENHYDRAMINE HCL 50 MG/ML IJ SOLN
25.0000 mg | Freq: Four times a day (QID) | INTRAMUSCULAR | Status: DC | PRN
  Administered 2014-11-13: 25 mg via INTRAVENOUS
  Filled 2014-11-13 (×3): qty 0.5

## 2014-11-13 MED ORDER — SODIUM CHLORIDE 0.9 % IJ SOLN
10.0000 mL | INTRAMUSCULAR | Status: AC | PRN
  Administered 2014-11-13: 10 mL

## 2014-11-13 MED ORDER — FOLIC ACID 1 MG PO TABS
1.0000 mg | ORAL_TABLET | Freq: Every day | ORAL | Status: DC
  Administered 2014-11-13: 1 mg via ORAL
  Filled 2014-11-13: qty 1

## 2014-11-13 MED ORDER — ONDANSETRON HCL 4 MG/2ML IJ SOLN: 4.0000 mg | Freq: Four times a day (QID) | INTRAMUSCULAR | Status: DC | PRN

## 2014-11-13 MED ORDER — NALOXONE HCL 0.4 MG/ML IJ SOLN: 0.4000 mg | INTRAMUSCULAR | Status: DC | PRN

## 2014-11-13 MED ORDER — SENNOSIDES-DOCUSATE SODIUM 8.6-50 MG PO TABS: 1.0000 | ORAL_TABLET | Freq: Two times a day (BID) | ORAL | Status: DC

## 2014-11-13 MED ORDER — POLYETHYLENE GLYCOL 3350 17 G PO PACK: 17.0000 g | PACK | Freq: Every day | ORAL | Status: DC | PRN

## 2014-11-13 MED ORDER — SODIUM CHLORIDE 0.9 % IJ SOLN: 9.0000 mL | INTRAMUSCULAR | Status: DC | PRN

## 2014-11-13 MED ORDER — DIPHENHYDRAMINE HCL 12.5 MG/5ML PO ELIX: 25.0000 mg | ORAL_SOLUTION | Freq: Four times a day (QID) | ORAL | Status: DC | PRN

## 2014-11-13 MED ORDER — FLUCONAZOLE 200 MG PO TABS: 200.0000 mg | ORAL_TABLET | Freq: Once | ORAL | Status: DC

## 2014-11-13 MED ORDER — METRONIDAZOLE 500 MG PO TABS: 500.0000 mg | ORAL_TABLET | Freq: Two times a day (BID) | ORAL | Status: AC

## 2014-11-13 MED ORDER — HYDROMORPHONE 2 MG/ML HIGH CONCENTRATION IV PCA SOLN
INTRAVENOUS | Status: DC
  Administered 2014-11-13: 15 mg via INTRAVENOUS
  Administered 2014-11-13: 12:00:00 via INTRAVENOUS
  Filled 2014-11-13: qty 25

## 2014-11-13 MED ORDER — KETOROLAC TROMETHAMINE 30 MG/ML IJ SOLN
30.0000 mg | Freq: Once | INTRAMUSCULAR | Status: AC
  Administered 2014-11-13: 30 mg via INTRAVENOUS
  Filled 2014-11-13: qty 1

## 2014-11-13 MED ORDER — DEXTROSE-NACL 5-0.45 % IV SOLN
INTRAVENOUS | Status: DC
  Administered 2014-11-13: 12:00:00 via INTRAVENOUS

## 2014-11-13 NOTE — Discharge Summary (Signed)
Physician Discharge Summary  [name] ZOX:[id number] DOB: 08/13/1985 DOA: 11/13/2014  PCP: [name],[name], MD  Admit date: 11/13/2014  Discharge date: 11/13/2014  Time spent: 30 minutes  Discharge Diagnoses:  Active Problems:   Sickle-cell disease with pain   Discharge Condition: Stable  Diet recommendation: Regular  Filed Weights   11/13/14 1053  Weight: 112 lb (50.803 kg)    History of present illness:  Brittany Foster is a 29 y.o. female with history of sickle cell anemia and frequent ED visits, was at the [facility] ED yesterday for complaints of chest and rib pains, d-dimer was slightly elevated but patient left AMA before CT angiogram was done to rule out pulmonary embolism. Patient denies palpitation, no fever, no shortness of breath. She called the day hospital this morning with complaints of generalized body pain, mostly at the extremities, attributed to his usual crisis. Patient rates the pain a 9 out of 10. She denies fever, shortness of breath, cough, headache, fatigue, abdominal pain, nausea, vomiting, diarrhea.   Hospital Course:  [name] was admitted to the day hospital with sickle cell painful crisis. Patient was treated with weight based IV Dilaudid PCA, IV Toradol as well as IV fluids. Lillymae showed significant improvement symptomatically and pain went down from 9 to 4 out of 10 at the time of discharge. Patient's urinalysis shows 11-20 WBC, many bacteria and positive trichomonas. Patient will be treated with Bactrim and metronidazole, she denies any allergy to these medications. She has been advised to inform her sexual partner(s) to be treated at the same time, avoid unprotected sexual intercourse during treatment. [name] will follow-up in the clinic as previously scheduled, continue with home medications as per prior to admission.  Discharge Exam: Filed Vitals:   11/13/14 1441  BP: 100/60  Pulse: 76  Temp:   Resp: 13    General  appearance: alert, cooperative and no distress Eyes: conjunctivae/corneas clear. PERRL, EOM's intact. Fundi benign. Neck: no adenopathy, no carotid bruit, no JVD, supple, symmetrical, trachea midline and thyroid not enlarged, symmetric, no tenderness/mass/nodules Back: symmetric, no curvature. ROM normal. No CVA tenderness. Resp: clear to auscultation bilaterally Chest wall: no tenderness Cardio: regular rate and rhythm, S1, S2 normal, no murmur, click, rub or gallop GI: soft, non-tender; bowel sounds normal; no masses, no organomegaly Extremities: extremities normal, atraumatic, no cyanosis or edema Pulses: 2+ and symmetric Skin: Skin color, texture, turgor normal. No rashes or lesions Neurologic: Grossly normal  Discharge Instructions We discussed the need for good hydration, monitoring of hydration status, avoidance of heat, cold, stress, and infection triggers. We discussed the need to be compliant with taking Hydrea. Ceria was reminded of the need to seek medical attention if any symptoms of bleeding, anemia, or infection occurs.   Discharge Medication List as of 11/13/2014  5:12 PM    CONTINUE these medications which have NOT CHANGED   Details  DULoxetine (CYMBALTA) 30 MG capsule Take 1 capsule (30 mg total) by mouth daily., Starting 11/09/2014, Until Discontinued, Normal    folic acid (FOLVITE) 1 MG tablet Take 1 tablet (1 mg total) by mouth daily., Starting 11/09/2014, Until Discontinued, Normal    HYDROcodone-acetaminophen (NORCO) 10-325 MG per tablet Take 1 tablet by mouth 3 (three) times daily as needed., Until Discontinued, Historical Med    hydroxyurea (HYDREA) 500 MG capsule Take 2 capsules (1,000 mg total) by mouth daily. May take with food to minimize GI side effects., Starting 11/09/2014, Until Discontinued, Normal    Naproxen Sodium (ALEVE) 220  MG CAPS Take 660 mg by mouth every 6 (six) hours as needed (pain)., Until Discontinued, Historical Med    potassium chloride SA  (K-DUR,KLOR-CON) 20 MEQ tablet Take 1 tablet (20 mEq total) by mouth daily., Starting 07/03/2014, Until Discontinued, Normal    Topiramate ER 100 MG CP24 Take 100 mg by mouth at bedtime., Until Discontinued, Historical Med    Buprenorphine HCl (BELBUCA) 75 MCG FILM Place inside cheek 2 (two) times daily., Until Discontinued, Historical Med    Cholecalciferol (VITAMIN D3) 5000 UNITS CAPS Take 1 capsule (5,000 Units total) by mouth daily., Starting 08/07/2014, Until Discontinued, Normal    gabapentin (NEURONTIN) 300 MG capsule Take 1 capsule (300 mg total) by mouth 3 (three) times daily., Starting 02/14/2014, Until Discontinued, Normal    oxyCODONE-acetaminophen (PERCOCET) 10-325 MG per tablet Take 1 tablet by mouth every 6 (six) hours as needed for pain., Starting 09/11/2014, Until Discontinued, Print       Allergies  Allergen Reactions  . Ultram [Tramadol] Other (See Comments)    seizures  . Morphine And Related Hives, Rash and Other (See Comments)    shaking   Follow-up Information    Please follow up.   Why:  As needed, If symptoms worsen      Significant Diagnostic Studies: No results found.  Signed:  Jeanann Lewandowsky MD, MHA, FACP, FAAP, CPE   11/13/2014, 5:30 PM

## 2014-11-13 NOTE — Progress Notes (Signed)
Pt discharged to home; discharge instructions explained, given, and signed; all questions answered; pt alert, oriented, and ambulatory; no complications noted 

## 2014-11-13 NOTE — Discharge Instructions (Signed)
Sickle Cell Anemia °Sickle cell anemia is a condition where your red blood cells are shaped like sickles. Red blood cells carry oxygen through the body. Sickle-shaped red blood cells do not live as long as normal red blood cells. They also clump together and block blood from flowing through the blood vessels. These things prevent the body from getting enough oxygen. Sickle cell anemia causes organ damage and pain. It also increases the risk of infection. °HOME CARE °· Drink enough fluid to keep your pee (urine) clear or pale yellow. Drink more in hot weather and during exercise. °· Do not smoke. Smoking lowers oxygen levels in the blood. °· Only take over-the-counter or prescription medicines as told by your doctor. °· Take antibiotic medicines as told by your doctor. Make sure you finish them even if you start to feel better. °· Take supplements as told by your doctor. °· Consider wearing a medical alert bracelet. This tells anyone caring for you in an emergency of your condition. °· When traveling, keep your medical information, doctors' names, and the medicines you take with you at all times. °· If you have a fever, do not take fever medicines right away. This could cover up a problem. Tell your doctor. °·  Keep all follow-up visits with your doctor. Sickle cell anemia requires regular medical care. °GET HELP IF: °You have a fever. °GET HELP RIGHT AWAY IF: °· You feel dizzy or faint. °· You have new belly (abdominal) pain, especially on the left side near the stomach area. °· You have a lasting, often uncomfortable and painful erection of the penis (priapism). If it is not treated right away, you will become unable to have sex (impotence). °· You have numbness in your arms or legs or you have a hard time moving them. °· You have a hard time talking. °· You have a fever or lasting symptoms for more than 2-3 days. °· You have a fever and your symptoms suddenly get worse. °· You have signs or symptoms of infection.  These include: °¨ Chills. °¨ Being more tired than normal (lethargy). °¨ Irritability. °¨ Poor eating. °¨ Throwing up (vomiting). °· You have pain that is not helped with medicine. °· You have shortness of breath. °· You have pain in your chest. °· You are coughing up pus-like or bloody mucus. °· You have a stiff neck. °· Your feet or hands swell or have pain. °· Your belly looks bloated. °· Your joints hurt. °MAKE SURE YOU: °· Understand these instructions. °· Will watch your condition. °· Will get help right away if you are not doing well or get worse. °Document Released: 12/07/2012 Document Revised: 07/03/2013 Document Reviewed: 12/07/2012 °Exit[employer]® Patient Information ©2015 [employer]. This information is not intended to replace advice given to you by your health care provider. Make sure you discuss any questions you have with your health care provider. ° °

## 2014-11-13 NOTE — H&P (Signed)
[facility] History [facility] Physical  [name] WJX:[id number] DOB: 19-Feb-1986 DOA: 11/13/2014  PCP: Altha Harm., MD   Chief Complaint:  Chief Complaint  Patient presents with  . Sickle Cell Pain Crisis    HPI: Brittany Foster is a 29 y.o. female with history of sickle cell anemia and frequent ED visits, was at the [facility] ED yesterday for complaints of chest and rib pains, d-dimer was slightly elevated but patient left AMA before CT angiogram was done to rule out pulmonary embolism. Patient denies palpitation, no fever, no shortness of breath. She called the day hospital this morning with complaints of generalized body pain, mostly at the extremities, attributed to his usual crisis. Patient rates the pain a 9 out of 10. She denies fever, shortness of breath, cough, headache, fatigue, abdominal pain, nausea, vomiting, diarrhea.   Systemic Review: General: The patient denies anorexia, fever, weight loss Cardiac: Denies chest pain, syncope, palpitations, pedal edema  Respiratory: Denies cough, shortness of breath, wheezing GI: Denies severe indigestion/heartburn, abdominal pain, nausea, vomiting, diarrhea and constipation GU: Denies hematuria, incontinence, dysuria  Musculoskeletal: Denies arthritis  Skin: Denies suspicious skin lesions Neurologic: Denies focal weakness or numbness, change in vision  Past Medical History  Diagnosis Date  . Sickle cell anemia     Past Surgical History  Procedure Laterality Date  . Tubal ligation    . Cholecystectomy    . Cesarean section    . Port a cath placement Right     about 6-7 years ago  . Cholecystectomy  2000    Allergies  Allergen Reactions  . Ultram [Tramadol] Other (See Comments)    seizures  . Morphine And Related Hives, Rash and Other (See Comments)    shaking    Family History  Problem Relation Age of Onset  . Sickle cell anemia Other   . Sickle cell trait Father   . Sickle cell trait Mother        Prior to Admission medications   Medication Sig Start Date End Date Taking? Authorizing Provider  DULoxetine (CYMBALTA) 30 MG capsule Take 1 capsule (30 mg total) by mouth daily. 11/09/14  Yes [name], FNP  folic acid (FOLVITE) 1 MG tablet Take 1 tablet (1 mg total) by mouth daily. 11/09/14  Yes [name], FNP  HYDROcodone-acetaminophen (NORCO) 10-325 MG per tablet Take 1 tablet by mouth 3 (three) times daily as needed.   Yes Historical Provider, MD  hydroxyurea (HYDREA) 500 MG capsule Take 2 capsules (1,000 mg total) by mouth daily. May take with food to minimize GI side effects. 11/09/14  Yes [name], FNP  Naproxen Sodium (ALEVE) 220 MG CAPS Take 660 mg by mouth every 6 (six) hours as needed (pain).   Yes Historical Provider, MD  potassium chloride SA (K-DUR,KLOR-CON) 20 MEQ tablet Take 1 tablet (20 mEq total) by mouth daily. 07/03/14  Yes Altha Harm, MD  Topiramate ER 100 MG CP24 Take 100 mg by mouth at bedtime.   Yes Historical Provider, MD  Buprenorphine HCl (BELBUCA) 75 MCG FILM Place inside cheek 2 (two) times daily.    Historical Provider, MD  Cholecalciferol (VITAMIN D3) 5000 UNITS CAPS Take 1 capsule (5,000 Units total) by mouth daily. Patient not taking: Reported on 11/09/2014 08/07/14   [name], FNP  gabapentin (NEURONTIN) 300 MG capsule Take 1 capsule (300 mg total) by mouth 3 (three) times daily. 02/14/14   Altha Harm, MD  oxyCODONE-acetaminophen (PERCOCET) 10-325 MG  per tablet Take 1 tablet by mouth every 6 (six) hours as needed for pain. 09/11/14   Henrietta Hoover, NP     Physical Exam: Filed Vitals:   11/13/14 1053  BP: 119/81  Pulse: 98  Temp: 98.6 F (37 C)  TempSrc: Oral  Resp: 18  Height: 5' (1.524 m)  Weight: 112 lb (50.803 kg)  SpO2: 100%    General: Alert, awake, afebrile, anicteric, not in obvious distress HEENT: Normocephalic and Atraumatic, Mucous membranes pink                PERRLA; EOM intact; No scleral  icterus,                 Nares: Patent, Oropharynx: Clear, Fair Dentition                 Neck: FROM, no cervical lymphadenopathy, thyromegaly, carotid bruit or JVD;  CHEST WALL: No tenderness  CHEST: Normal respiration, clear to auscultation bilaterally  HEART: Regular rate and rhythm; no murmurs rubs or gallops  BACK: No kyphosis or scoliosis; no CVA tenderness  ABDOMEN: Positive Bowel Sounds, soft, non-tender; no masses, no organomegaly Rectal Exam: deferred EXTREMITIES: No cyanosis, clubbing, or edema SKIN:  no rash or ulceration  CNS: Alert and Oriented x 4, Nonfocal exam, CN 2-12 intact  Labs on Admission:  Basic Metabolic Panel:  Recent Labs Lab 11/08/14 1143  NA 137  K 3.5  CL 112*  CO2 19*  GLUCOSE 97  BUN 9  CREATININE 0.39*  CALCIUM 8.9   Liver Function Tests:  Recent Labs Lab 11/08/14 1143  AST 47*  ALT 35  ALKPHOS 113  BILITOT 2.2*  PROT 8.0  ALBUMIN 4.3   No results for input(s): LIPASE, AMYLASE in the last 168 hours. No results for input(s): AMMONIA in the last 168 hours. CBC:  Recent Labs Lab 11/08/14 1143  WBC 10.2  NEUTROABS 7.8*  HGB 9.8*  HCT 28.3*  MCV 100.0  PLT 284   Cardiac Enzymes: No results for input(s): CKTOTAL, CKMB, CKMBINDEX, TROPONINI in the last 168 hours.  BNP (last 3 results)  Recent Labs  03/18/14 1811 04/29/14 1459  BNP 78.1 114.5*    ProBNP (last 3 results) No results for input(s): PROBNP in the last 8760 hours.  CBG: No results for input(s): GLUCAP in the last 168 hours.   Assessment/Plan Active Problems:   Sickle-cell disease with pain   Admits to the Day Hospital  IVF D5 .45% Saline @ 125 mls/hour  Weight based Dilaudid PCA started within 30 minutes of admission  IV Toradol   Monitor vitals very closely, Re-evaluate pain scale every hour  Oxygen by [place]  Patient will be re-evaluated for pain in the context of function and relationship to baseline as care progresses.  If no significant  relieve from pain (remains above 5/10) will transfer patient to inpatient services for further evaluation and management  Code Status: Full  Family Communication: None  DVT Prophylaxis: Ambulate as tolerated   Time spent: 35 Minutes  Shirlee Whitmire, MD, MHA, FACP, FAAP, CPE  If 7PM-7AM, please contact night-coverage [email or web address] 11/13/2014, 11:15 AM

## 2014-11-13 NOTE — Telephone Encounter (Signed)
Pt called and states experiencing pain in legs, back, and ribs; states experiencing no relief from home pain medications; pt denies SOB, CP, or nausea, vomiting, or diarrhea; MD notified; pt notified that she may come to day hospital for evaluation; pt verbalizes understanding

## 2014-11-14 ENCOUNTER — Telehealth (HOSPITAL_COMMUNITY): Payer: Self-pay | Admitting: Hematology

## 2014-11-14 NOTE — Telephone Encounter (Signed)
Spoke with caller and advised that appointments in the physician's office are made in advance and the patient should be aware of them ahead of time.  Explained that the sickle cell day hospital visits are not made ahead of time.  Explained to caller that patient's generally call in the morning around 8a.m. And a telephone triage is done.  Then a provider makes the determination if the patient can be seen that day at the [facility], and that there was no way to schedule acute crisis visits ahead of time.  Explained that this is a ambulatory center, so patients are responsible for their own transportation.  Debbie voiced understanding, and advised transportation exceptions had been made several times for this patient.

## 2014-11-22 ENCOUNTER — Telehealth (HOSPITAL_COMMUNITY): Payer: Self-pay

## 2014-11-22 NOTE — Telephone Encounter (Signed)
Patient called complaining of pain all over 7/10. Last pain medication oxycodone 10 mg taken at 0500. Denies fever, chest pain, SOB, N/V, dairrhea, or abdominal pain. Would like to come to day hospital for treatment.

## 2014-11-23 ENCOUNTER — Telehealth (HOSPITAL_COMMUNITY): Payer: Self-pay

## 2014-11-23 ENCOUNTER — Encounter (HOSPITAL_COMMUNITY): Payer: Self-pay | Admitting: *Deleted

## 2014-11-23 ENCOUNTER — Non-Acute Institutional Stay (HOSPITAL_COMMUNITY)
Admission: AD | Admit: 2014-11-23 | Discharge: 2014-11-23 | Disposition: A | Discharge status: Home / Self Care | Payer: Medicaid Other | Attending: Internal Medicine | Admitting: Internal Medicine

## 2014-11-23 DIAGNOSIS — Z79891 Long term (current) use of opiate analgesic: Secondary | ICD-10-CM | POA: Diagnosis not present

## 2014-11-23 DIAGNOSIS — Z791 Long term (current) use of non-steroidal anti-inflammatories (NSAID): Secondary | ICD-10-CM | POA: Insufficient documentation

## 2014-11-23 DIAGNOSIS — Z79899 Other long term (current) drug therapy: Secondary | ICD-10-CM | POA: Insufficient documentation

## 2014-11-23 DIAGNOSIS — Z87891 Personal history of nicotine dependence: Secondary | ICD-10-CM | POA: Insufficient documentation

## 2014-11-23 DIAGNOSIS — D5700 Hb-SS disease with crisis, unspecified: Secondary | ICD-10-CM

## 2014-11-23 DIAGNOSIS — M549 Dorsalgia, unspecified: Secondary | ICD-10-CM | POA: Diagnosis present

## 2014-11-23 LAB — CBC WITH DIFFERENTIAL/PLATELET
BASOS ABS: 0.1 10*3/uL (ref 0.0–0.1)
Basophils Relative: 1 %
Eosinophils Absolute: 0.1 10*3/uL (ref 0.0–0.7)
Eosinophils Relative: 1 %
HEMATOCRIT: 25.9 % — AB (ref 36.0–46.0)
Hemoglobin: 8.9 g/dL — ABNORMAL LOW (ref 12.0–15.0)
LYMPHS PCT: 33 %
Lymphs Abs: 3.3 10*3/uL (ref 0.7–4.0)
MCH: 34 pg (ref 26.0–34.0)
MCHC: 34.4 g/dL (ref 30.0–36.0)
MCV: 98.9 fL (ref 78.0–100.0)
MONO ABS: 1.3 10*3/uL — AB (ref 0.1–1.0)
MONOS PCT: 13 %
NEUTROS ABS: 5.3 10*3/uL (ref 1.7–7.7)
Neutrophils Relative %: 52 %
Platelets: 265 10*3/uL (ref 150–400)
RBC: 2.62 MIL/uL — ABNORMAL LOW (ref 3.87–5.11)
RDW: 17.7 % — AB (ref 11.5–15.5)
WBC: 10.2 10*3/uL (ref 4.0–10.5)

## 2014-11-23 LAB — LACTATE DEHYDROGENASE: LDH: 235 U/L — ABNORMAL HIGH (ref 98–192)

## 2014-11-23 MED ORDER — NALOXONE HCL 0.4 MG/ML IJ SOLN: 0.4000 mg | INTRAMUSCULAR | Status: DC | PRN

## 2014-11-23 MED ORDER — HEPARIN SOD (PORK) LOCK FLUSH 100 UNIT/ML IV SOLN: 500.0000 [IU] | INTRAVENOUS | Status: DC | PRN

## 2014-11-23 MED ORDER — OXYCODONE HCL 5 MG PO TABS
5.0000 mg | ORAL_TABLET | Freq: Once | ORAL | Status: AC
  Administered 2014-11-23: 5 mg via ORAL
  Filled 2014-11-23: qty 1

## 2014-11-23 MED ORDER — KETOROLAC TROMETHAMINE 30 MG/ML IJ SOLN
30.0000 mg | Freq: Four times a day (QID) | INTRAMUSCULAR | Status: DC
  Administered 2014-11-23: 30 mg via INTRAVENOUS
  Filled 2014-11-23: qty 1

## 2014-11-23 MED ORDER — SODIUM CHLORIDE 0.9 % IV SOLN
12.5000 mg | Freq: Four times a day (QID) | INTRAVENOUS | Status: DC | PRN
  Administered 2014-11-23: 12.5 mg via INTRAVENOUS
  Filled 2014-11-23 (×3): qty 0.25

## 2014-11-23 MED ORDER — HYDROMORPHONE 2 MG/ML HIGH CONCENTRATION IV PCA SOLN
INTRAVENOUS | Status: DC
  Administered 2014-11-23: 16 mg via INTRAVENOUS
  Administered 2014-11-23: 11:00:00 via INTRAVENOUS
  Filled 2014-11-23: qty 25

## 2014-11-23 MED ORDER — ONDANSETRON HCL 4 MG/2ML IJ SOLN: 4.0000 mg | Freq: Four times a day (QID) | INTRAMUSCULAR | Status: DC | PRN

## 2014-11-23 MED ORDER — SODIUM CHLORIDE 0.9 % IJ SOLN
10.0000 mL | INTRAMUSCULAR | Status: AC | PRN
  Administered 2014-11-23: 10 mL

## 2014-11-23 MED ORDER — SODIUM CHLORIDE 0.9 % IJ SOLN: 10.0000 mL | INTRAMUSCULAR | Status: DC | PRN

## 2014-11-23 MED ORDER — SODIUM CHLORIDE 0.9 % IJ SOLN: 9.0000 mL | INTRAMUSCULAR | Status: DC | PRN

## 2014-11-23 MED ORDER — OXYCODONE-ACETAMINOPHEN 5-325 MG PO TABS
1.0000 | ORAL_TABLET | Freq: Once | ORAL | Status: AC
  Administered 2014-11-23: 1 via ORAL
  Filled 2014-11-23: qty 1

## 2014-11-23 MED ORDER — HEPARIN SOD (PORK) LOCK FLUSH 100 UNIT/ML IV SOLN
500.0000 [IU] | INTRAVENOUS | Status: AC | PRN
  Administered 2014-11-23: 500 [IU]
  Filled 2014-11-23: qty 5

## 2014-11-23 MED ORDER — DEXTROSE-NACL 5-0.45 % IV SOLN
INTRAVENOUS | Status: DC
  Administered 2014-11-23: 11:00:00 via INTRAVENOUS

## 2014-11-23 MED ORDER — DIPHENHYDRAMINE HCL 12.5 MG/5ML PO ELIX: 12.5000 mg | ORAL_SOLUTION | Freq: Four times a day (QID) | ORAL | Status: DC | PRN

## 2014-11-23 NOTE — Telephone Encounter (Signed)
Patient called complaining of pain all over, rates at a 7/10. Last pain medication oxycodone  taken at 0600. Denies fever, chest pain, SOB, N/V, and diarrhea. She would like to come to day hospital for treatment.

## 2014-11-23 NOTE — Discharge Summary (Signed)
[facility] Discharge Summary   Patient ID: Brittany Foster MRN: [id number] DOB/AGE: [birth year] 30 y.o.  Admit date: 11/23/2014 Discharge date: 11/23/2014  Primary Care Physician:  [name],[name], MD  Admission Diagnoses:  Active Problems:   Sickle cell anemia with pain   Discharge Medications:    Medication List    ASK your doctor about these medications        ALEVE 220 MG Caps  Generic drug:  Naproxen Sodium  Take 660 mg by mouth every 6 (six) hours as needed (pain).     BELBUCA 75 MCG Film  Generic drug:  Buprenorphine HCl  Place inside cheek 2 (two) times daily.     DULoxetine 30 MG capsule  Commonly known as:  CYMBALTA  Take 1 capsule (30 mg total) by mouth daily.     fluconazole 200 MG tablet  Commonly known as:  DIFLUCAN  Take 1 tablet (200 mg total) by mouth once.     folic acid 1 MG tablet  Commonly known as:  FOLVITE  Take 1 tablet (1 mg total) by mouth daily.     gabapentin 300 MG capsule  Commonly known as:  NEURONTIN  Take 1 capsule (300 mg total) by mouth 3 (three) times daily.     HYDROcodone-acetaminophen 10-325 MG per tablet  Commonly known as:  NORCO  Take 1 tablet by mouth 3 (three) times daily as needed.     hydroxyurea 500 MG capsule  Commonly known as:  HYDREA  Take 2 capsules (1,000 mg total) by mouth daily. May take with food to minimize GI side effects.     oxyCODONE-acetaminophen 10-325 MG per tablet  Commonly known as:  PERCOCET  Take 1 tablet by mouth every 6 (six) hours as needed for pain.     potassium chloride SA 20 MEQ tablet  Commonly known as:  K-DUR,KLOR-CON  Take 1 tablet (20 mEq total) by mouth daily.     Topiramate ER 100 MG Cp24  Take 100 mg by mouth at bedtime.     Vitamin D3 5000 UNITS Caps  Take 1 capsule (5,000 Units total) by mouth daily.         Consults:  None  Significant Diagnostic Studies:  No results found.   [facility] Course: Brittany Foster was admitted to  the day infusion center for extended observation.  Reviewed labs, consistent with baseline results. Brittany Foster was started on a high concentration PCA per weight based protocol. She used a total of 16 mg with 36  demands and 32 deliveries. Patient given Percocet 10-325 mg po 45 minutes prior to discharge. Patient is functional, alert, oriented and ambulatory. She states that pain intensity is 7/10. Patient will discharge home on current medication regimen. She is to follow up with pain management to discuss medications. Continue home medications as previously prescribed. She is also encouraged to ambulate.   Physical Exam at Discharge:  BP 99/59 mmHg  Pulse 79  Temp(Src) 98.4 F (36.9 C) (Oral)  Resp 12  Ht 5' (1.524 m)  Wt 112 lb (50.803 kg)  BMI 21.87 kg/m2  SpO2 99%  LMP 11/23/2014  General Appearance:    Alert, cooperative, no distress, appears stated age  Head:    Normocephalic, without obvious abnormality, atraumatic  Eyes:    PERRL, conjunctiva/corneas clear, EOM's intact, fundi    benign, both eyes  Ears:    Normal TM's and external ear canals, both ears  Nose:   Nares normal, septum  midline, mucosa normal, no drainage    or sinus tenderness  Throat:   Lips, mucosa, and tongue normal; teeth and gums normal  Neck:   Supple, symmetrical, trachea midline, no adenopathy;    thyroid:  no enlargement/tenderness/nodules; no carotid   bruit or JVD  Back:     Symmetric, no curvature, ROM normal, no CVA tenderness  Lungs:     Clear to auscultation bilaterally, respirations unlabored  Chest Wall:    No tenderness or deformity   Heart:    Regular rate and rhythm, S1 and S2 normal, no murmur, rub   or gallop  Abdomen:     Soft, non-tender, bowel sounds active all four quadrants,    no masses, no organomegaly  Extremities:   Extremities normal, atraumatic, no cyanosis or edema  Pulses:   2+ and symmetric all extremities  Skin:   Skin color, texture, turgor normal, no rashes or lesions   Lymph nodes:   Cervical, supraclavicular, and axillary nodes normal  Neurologic:   CNII-XII intact, normal strength, sensation and reflexes    throughout   Disposition at Discharge: 01-Home or Self Care  Discharge Orders:   Condition at Discharge:   Stable  Time spent on Discharge:  25 minutes  Signed: [name],Lachina M 11/23/2014, 4:16 PM

## 2014-11-23 NOTE — H&P (Signed)
[facility] History and Physical   Date: 11/23/2014  Patient name: Brittany Foster Medical record number: [id number] Date of birth: Dec 02, 1985 Age: 29 y.o. Gender: female PCP: [name],[name], MD  Attending physician: Quentin Angst, MD  Chief Complaint: Sickle cell pain  History of Present Illness: Ms. Shanessa Hodak, a 29 year old female with a history of sickle cell anemia, HbSS presents complaining of pain to lower back and extremities consistent with sickle cell anemia. Patient states that she was discharged from the hospital on Monday, September 19 th. She describes pain as constant and aching. Current pain intensity is 7/10, unrelieved by Percocet 10-25 last taken at 6 am with minimal relief. Patient denies fatigue, headache, shortness of breath, chest pains,  nausea, vomiting, or diarrhea.   Meds: Prescriptions prior to admission  Medication Sig Dispense Refill Last Dose  . DULoxetine (CYMBALTA) 30 MG capsule Take 1 capsule (30 mg total) by mouth daily. 30 capsule 3 11/23/2014 at Unknown time  . folic acid (FOLVITE) 1 MG tablet Take 1 tablet (1 mg total) by mouth daily. 30 tablet 11 11/23/2014 at Unknown time  . gabapentin (NEURONTIN) 300 MG capsule Take 1 capsule (300 mg total) by mouth 3 (three) times daily. 90 capsule 2 11/23/2014 at Unknown time  . hydroxyurea (HYDREA) 500 MG capsule Take 2 capsules (1,000 mg total) by mouth daily. May take with food to minimize GI side effects. 60 capsule 3 11/23/2014 at Unknown time  . oxyCODONE-acetaminophen (PERCOCET) 10-325 MG per tablet Take 1 tablet by mouth every 6 (six) hours as needed for pain. 100 tablet 0 11/23/2014 at Unknown time  . Topiramate ER 100 MG CP24 Take 100 mg by mouth at bedtime.   11/23/2014 at Unknown time  . Buprenorphine HCl (BELBUCA) 75 MCG FILM Place inside cheek 2 (two) times daily.   Taking  . Cholecalciferol (VITAMIN D3) 5000 UNITS CAPS Take 1 capsule (5,000 Units total) by mouth daily. (Patient  not taking: Reported on 11/09/2014) 30 capsule 5 More than a month at Unknown time  . fluconazole (DIFLUCAN) 200 MG tablet Take 1 tablet (200 mg total) by mouth once. 2 tablet 0   . HYDROcodone-acetaminophen (NORCO) 10-325 MG per tablet Take 1 tablet by mouth 3 (three) times daily as needed.   Not Taking  . Naproxen Sodium (ALEVE) 220 MG CAPS Take 660 mg by mouth every 6 (six) hours as needed (pain).   Past Month at Unknown time  . potassium chloride SA (K-DUR,KLOR-CON) 20 MEQ tablet Take 1 tablet (20 mEq total) by mouth daily. 30 tablet 1 11/13/2014 at Unknown time    Allergies: Ultram and Morphine and related Past Medical History  Diagnosis Date  . Sickle cell anemia    Past Surgical History  Procedure Laterality Date  . Tubal ligation    . Cholecystectomy    . Cesarean section    . Port a cath placement Right     about 6-7 years ago  . Cholecystectomy  2000   Family History  Problem Relation Age of Onset  . Sickle cell anemia Other   . Sickle cell trait Father   . Sickle cell trait Mother    Social History   Social History  . Marital Status: Single    Spouse Name: N/A  . Number of Children: N/A  . Years of Education: N/A   Occupational History  . Not on file.   Social History Main Topics  . Smoking status: Former Games developer  .  Smokeless tobacco: Not on file     Comment: smokes once every couple of weeks.   . Alcohol Use: No  . Drug Use: No  . Sexual Activity: Yes    Birth Control/ Protection: Surgical   Other Topics Concern  . Not on file   Social History Narrative    Review of Systems: Constitutional: negative for fatigue, fevers and sweats Eyes: negative for icterus Ears, nose, mouth, throat, and face: negative Respiratory: negative for cough and dyspnea on exertion Cardiovascular: negative Gastrointestinal: negative Genitourinary:negative Integument/breast: negative Hematologic/lymphatic: negative Musculoskeletal:positive for myalgias Neurological:  negative Behavioral/Psych: negative Endocrine: negative Allergic/Immunologic: negative  Physical Exam:  BP 115/68 mmHg  Pulse 83  Temp(Src) 98.3 F (36.8 C) (Oral)  Resp 16  Ht 5' (1.524 m)  Wt 112 lb (50.803 kg)  BMI 21.87 kg/m2  SpO2 100%  LMP 11/23/2014  General Appearance:    Alert, cooperative, no distress, appears stated age  Head:    Normocephalic, without obvious abnormality, atraumatic  Eyes:    PERRL, conjunctiva/corneas clear, EOM's intact, fundi    benign, both eyes  Ears:    Normal TM's and external ear canals, both ears  Nose:   Nares normal, septum midline, mucosa normal, no drainage    or sinus tenderness  Throat:   Lips, mucosa, and tongue normal; teeth and gums normal  Neck:   Supple, symmetrical, trachea midline, no adenopathy;    thyroid:  no enlargement/tenderness/nodules; no carotid   bruit or JVD  Back:     Symmetric, no curvature, ROM normal, no CVA tenderness  Lungs:     Clear to auscultation bilaterally, respirations unlabored  Chest Wall:    No tenderness or deformity   Heart:    Regular rate and rhythm, S1 and S2 normal, no murmur, rub   or gallop  Abdomen:     Soft, non-tender, bowel sounds active all four quadrants,    no masses, no organomegaly  Extremities:   Extremities normal, atraumatic, no cyanosis or edema  Pulses:   2+ and symmetric all extremities  Skin:   Skin color, texture, turgor normal, no rashes or lesions  Lymph nodes:   Cervical, supraclavicular, and axillary nodes normal  Neurologic:   CNII-XII intact, normal strength, sensation and reflexes    throughout    Lab results: No results found for this or any previous visit (from the past 24 hour(s)).  Imaging results:  No results found.   Assessment & Plan:  Patient will be admitted to the day infusion center for extended observation  Start IV D5.45 for cellular rehydration at 125/hr  Start Toradol 30 mg IV every 6 hours for inflammation.  Start Dilaudid PCA High  Concentration per weight based protocol.   Patient will be re-evaluated for pain intensity in the context of function and relationship to baseline as care progresses.  If no significant pain relief, will transfer patient to inpatient services for a higher level of care.   Will check LDH and CBC w/differential  [name],Lachina M 11/23/2014, 10:16 AM

## 2014-11-23 NOTE — Progress Notes (Signed)
[facility]/o pain in her back and both legs. Pt c/o pain 7/10. Pt received Dilaudid PCA with IV push of Toradol. Pt's pain remain at 7 while here at the hospital. Pt enc to take home meds as prescribed and drink fluids. PCA and IV fluids discontinued and pt discharged to home.

## 2014-11-23 NOTE — Telephone Encounter (Signed)
After speaking with NP, returned patient phone call. Told she could come to the day hospital today for treatment and she should be here by 10:30. Patient verbalized understanding.

## 2014-11-23 NOTE — Discharge Instructions (Signed)
Sickle Cell Anemia, Adult °Sickle cell anemia is a condition in which red blood cells have an abnormal "sickle" shape. This abnormal shape shortens the cells' life span, which results in a lower than normal concentration of red blood cells in the blood. The sickle shape also causes the cells to clump together and block free blood flow through the blood vessels. As a result, the tissues and organs of the body do not receive enough oxygen. Sickle cell anemia causes organ damage and pain and increases the risk of infection. °CAUSES  °Sickle cell anemia is a genetic disorder. Those who receive two copies of the gene have the condition, and those who receive one copy have the trait. °RISK FACTORS °The sickle cell gene is most common in people whose families originated in [place]. Other areas of the globe where sickle cell trait occurs include the Mediterranean, [place] and [place], the [place], and the [place].  °SIGNS AND SYMPTOMS °· Pain, especially in the extremities, back, chest, or abdomen (common). The pain may start suddenly or may develop following an illness, especially if there is dehydration. Pain can also occur due to overexertion or exposure to extreme temperature changes. °· Frequent severe bacterial infections, especially certain types of pneumonia and meningitis. °· Pain and swelling in the hands and feet. °· Decreased activity.   °· Loss of appetite.   °· Change in behavior. °· Headaches. °· Seizures. °· Shortness of breath or difficulty breathing. °· Vision changes. °· Skin ulcers. °Those with the trait may not have symptoms or they may have mild symptoms.  °DIAGNOSIS  °Sickle cell anemia is diagnosed with blood tests that demonstrate the genetic trait. It is often diagnosed during the newborn period, due to mandatory testing nationwide. A variety of blood tests, X-rays, CT scans, MRI scans, ultrasounds, and lung function tests may also be done to monitor the condition. °TREATMENT  °Sickle  cell anemia may be treated with: °· Medicines. You may be given pain medicines, antibiotic medicines (to treat and prevent infections) or medicines to increase the production of certain types of hemoglobin. °· Fluids. °· Oxygen. °· Blood transfusions. °HOME CARE INSTRUCTIONS  °· Drink enough fluid to keep your urine clear or pale yellow. Increase your fluid intake in hot weather and during exercise. °· Do not smoke. Smoking lowers oxygen levels in the blood.   °· Only take over-the-counter or prescription medicines for pain, fever, or discomfort as directed by your health care provider. °· Take antibiotics as directed by your health care provider. Make sure you finish them it even if you start to feel better.   °· Take supplements as directed by your health care provider.   °· Consider wearing a medical alert bracelet. This tells anyone caring for you in an emergency of your condition.   °· When traveling, keep your medical information, health care provider's names, and the medicines you take with you at all times.   °· If you develop a fever, do not take medicines to reduce the fever right away. This could cover up a problem that is developing. Notify your health care provider. °· Keep all follow-up appointments with your health care provider. Sickle cell anemia requires regular medical care. °SEEK MEDICAL CARE IF: ° You have a fever. °SEEK IMMEDIATE MEDICAL CARE IF:  °· You feel dizzy or faint.   °· You have new abdominal pain, especially on the left side near the stomach area.   °· You develop a persistent, often uncomfortable and painful penile erection (priapism). If this is not treated immediately it   will lead to impotence.   °· You have numbness your arms or legs or you have a hard time moving them.   °· You have a hard time with speech.   °· You have a fever or persistent symptoms for more than 2-3 days.   °· You have a fever and your symptoms suddenly get worse.   °· You have signs or symptoms of infection.  These include:   °¨ Chills.   °¨ Abnormal tiredness (lethargy).   °¨ Irritability.   °¨ Poor eating.   °¨ Vomiting.   °· You develop pain that is not helped with medicine.   °· You develop shortness of breath. °· You have pain in your chest.   °· You are coughing up pus-like or bloody sputum.   °· You develop a stiff neck. °· Your feet or hands swell or have pain. °· Your abdomen appears bloated. °· You develop joint pain. °MAKE SURE YOU: °· Understand these instructions. °Document Released: 05/27/2005 Document Revised: 07/03/2013 Document Reviewed: 09/28/2012 °[employer]® Patient Information ©2015 [employer]. This information is not intended to replace advice given to you by your health care provider. Make sure you discuss any questions you have with your health care provider. ° °

## 2014-11-27 ENCOUNTER — Telehealth (HOSPITAL_COMMUNITY): Payer: Self-pay | Admitting: Hematology

## 2014-11-27 NOTE — Telephone Encounter (Signed)
Patient C/O pain to legs/back that is 8/10 on pain scale. Patient denies N/V/D, patient denies chest pain,or shortness of breath, or abdominal pain.  Placed caller on hold and spoke with [name] [name], NP.  Advised patient that per NP, she needs to take her medication as prescribed by pain clinic, increase fluids, take folic acid and hydrea.  Patient verbalizes understanding.

## 2014-11-30 ENCOUNTER — Telehealth (HOSPITAL_COMMUNITY): Payer: Self-pay

## 2014-11-30 NOTE — Telephone Encounter (Signed)
After speaking with NP, returned patient phone, patient instructed to contact pain clinic. Patient verbalized understanding.

## 2014-11-30 NOTE — Telephone Encounter (Signed)
Patient called complaining of pain all over. Rates 7/10. Last pain medication oxycodone 10 mg taken at 0600. Denies fever, Chest pain, SOB, nausea/ vomiting or diarrhea. Would like to come to day hospital for treatment.

## 2014-12-03 ENCOUNTER — Telehealth (HOSPITAL_COMMUNITY): Payer: Self-pay | Admitting: *Deleted

## 2014-12-03 ENCOUNTER — Encounter (HOSPITAL_COMMUNITY): Payer: Self-pay | Admitting: *Deleted

## 2014-12-03 ENCOUNTER — Telehealth: Payer: Self-pay | Admitting: *Deleted

## 2014-12-03 ENCOUNTER — Non-Acute Institutional Stay (HOSPITAL_COMMUNITY)
Admission: AD | Admit: 2014-12-03 | Discharge: 2014-12-03 | Disposition: A | Discharge status: Home / Self Care | Payer: Medicaid Other | Attending: Internal Medicine | Admitting: Internal Medicine

## 2014-12-03 DIAGNOSIS — D5700 Hb-SS disease with crisis, unspecified: Secondary | ICD-10-CM | POA: Diagnosis not present

## 2014-12-03 DIAGNOSIS — Z791 Long term (current) use of non-steroidal anti-inflammatories (NSAID): Secondary | ICD-10-CM | POA: Insufficient documentation

## 2014-12-03 DIAGNOSIS — Z79891 Long term (current) use of opiate analgesic: Secondary | ICD-10-CM | POA: Diagnosis not present

## 2014-12-03 DIAGNOSIS — Z87891 Personal history of nicotine dependence: Secondary | ICD-10-CM | POA: Insufficient documentation

## 2014-12-03 DIAGNOSIS — Z79899 Other long term (current) drug therapy: Secondary | ICD-10-CM | POA: Insufficient documentation

## 2014-12-03 DIAGNOSIS — R52 Pain, unspecified: Secondary | ICD-10-CM | POA: Diagnosis present

## 2014-12-03 LAB — LACTATE DEHYDROGENASE: LDH: 208 U/L — AB (ref 98–192)

## 2014-12-03 LAB — COMPREHENSIVE METABOLIC PANEL
ALT: 26 U/L (ref 14–54)
AST: 40 U/L (ref 15–41)
Albumin: 4.5 g/dL (ref 3.5–5.0)
Alkaline Phosphatase: 98 U/L (ref 38–126)
Anion gap: 3 — ABNORMAL LOW (ref 5–15)
BILIRUBIN TOTAL: 2 mg/dL — AB (ref 0.3–1.2)
BUN: 9 mg/dL (ref 6–20)
CHLORIDE: 111 mmol/L (ref 101–111)
CO2: 22 mmol/L (ref 22–32)
CREATININE: 0.37 mg/dL — AB (ref 0.44–1.00)
Calcium: 8.6 mg/dL — ABNORMAL LOW (ref 8.9–10.3)
GFR calc Af Amer: >60 mL/min (ref >60)
GLUCOSE: 191 mg/dL — AB (ref 65–99)
Potassium: 3.3 mmol/L — ABNORMAL LOW (ref 3.5–5.1)
Sodium: 136 mmol/L (ref 135–145)
Total Protein: 7.4 g/dL (ref 6.5–8.1)

## 2014-12-03 LAB — CBC WITH DIFFERENTIAL/PLATELET
BASOS ABS: 0 10*3/uL (ref 0.0–0.1)
Basophils Relative: 0 %
Eosinophils Absolute: 0 10*3/uL (ref 0.0–0.7)
Eosinophils Relative: 0 %
HEMATOCRIT: 23.8 % — AB (ref 36.0–46.0)
Hemoglobin: 8.2 g/dL — ABNORMAL LOW (ref 12.0–15.0)
LYMPHS PCT: 22 %
Lymphs Abs: 2.8 10*3/uL (ref 0.7–4.0)
MCH: 34.3 pg — ABNORMAL HIGH (ref 26.0–34.0)
MCHC: 34.5 g/dL (ref 30.0–36.0)
MCV: 99.6 fL (ref 78.0–100.0)
Monocytes Absolute: 1.1 10*3/uL — ABNORMAL HIGH (ref 0.1–1.0)
Monocytes Relative: 8 %
NEUTROS ABS: 8.7 10*3/uL — AB (ref 1.7–7.7)
NEUTROS PCT: 70 %
Platelets: 282 10*3/uL (ref 150–400)
RBC: 2.39 MIL/uL — AB (ref 3.87–5.11)
RDW: 18 % — ABNORMAL HIGH (ref 11.5–15.5)
WBC: 12.6 10*3/uL — AB (ref 4.0–10.5)

## 2014-12-03 MED ORDER — SODIUM CHLORIDE 0.9 % IV SOLN
12.5000 mg | Freq: Four times a day (QID) | INTRAVENOUS | Status: DC | PRN
  Administered 2014-12-03: 12.5 mg via INTRAVENOUS
  Filled 2014-12-03 (×3): qty 0.25

## 2014-12-03 MED ORDER — SODIUM CHLORIDE 0.9 % IJ SOLN
10.0000 mL | INTRAMUSCULAR | Status: DC | PRN
Start: 2014-12-03 — End: 2014-12-04

## 2014-12-03 MED ORDER — KETOROLAC TROMETHAMINE 30 MG/ML IJ SOLN
30.0000 mg | Freq: Four times a day (QID) | INTRAMUSCULAR | Status: AC
  Administered 2014-12-03: 30 mg via INTRAVENOUS
  Filled 2014-12-03: qty 1

## 2014-12-03 MED ORDER — OXYCODONE HCL 5 MG PO TABS
5.0000 mg | ORAL_TABLET | Freq: Once | ORAL | Status: AC
  Administered 2014-12-03: 5 mg via ORAL
  Filled 2014-12-03: qty 1

## 2014-12-03 MED ORDER — DIPHENHYDRAMINE HCL 12.5 MG/5ML PO ELIX
12.5000 mg | ORAL_SOLUTION | Freq: Four times a day (QID) | ORAL | Status: DC | PRN
  Administered 2014-12-03: 12.5 mg via ORAL
  Filled 2014-12-03: qty 5

## 2014-12-03 MED ORDER — HYDROMORPHONE 2 MG/ML HIGH CONCENTRATION IV PCA SOLN
INTRAVENOUS | Status: DC
  Administered 2014-12-03: 11:00:00 via INTRAVENOUS
  Filled 2014-12-03: qty 25

## 2014-12-03 MED ORDER — ONDANSETRON HCL 4 MG/2ML IJ SOLN: 4.0000 mg | Freq: Four times a day (QID) | INTRAMUSCULAR | Status: DC | PRN

## 2014-12-03 MED ORDER — SODIUM CHLORIDE 0.9 % IJ SOLN: 9.0000 mL | INTRAMUSCULAR | Status: DC | PRN

## 2014-12-03 MED ORDER — HEPARIN SOD (PORK) LOCK FLUSH 100 UNIT/ML IV SOLN
500.0000 [IU] | INTRAVENOUS | Status: DC | PRN
  Filled 2014-12-03: qty 5

## 2014-12-03 MED ORDER — DEXTROSE-NACL 5-0.45 % IV SOLN
INTRAVENOUS | Status: DC
  Administered 2014-12-03: 11:00:00 via INTRAVENOUS

## 2014-12-03 MED ORDER — NALOXONE HCL 0.4 MG/ML IJ SOLN: 0.4000 mg | INTRAMUSCULAR | Status: DC | PRN

## 2014-12-03 MED ORDER — OXYCODONE-ACETAMINOPHEN 5-325 MG PO TABS
1.0000 | ORAL_TABLET | Freq: Once | ORAL | Status: AC
  Administered 2014-12-03: 1 via ORAL
  Filled 2014-12-03: qty 1

## 2014-12-03 NOTE — Telephone Encounter (Signed)
Patient phoned in complaints of rib/legs/hip with a pain level of 8 out of 10. Patient has taken oxy/aleve but not long acting medication. Patient has no other complaints. Informed patient I would notify MD and return call.

## 2014-12-03 NOTE — Progress Notes (Addendum)
Pt discharged to home, discharge instructions explained, all questions answered, pt alert, oriented, and ambulatory. No complaints- pain currently 5.

## 2014-12-03 NOTE — Discharge Summary (Signed)
[facility] Discharge Summary   Patient ID: Saisha Capuano MRN: [id number] DSalem Mastrogiovanni-09-1985 29 y.o.  Admit date: 12/03/2014 Discharge date: 12/03/2014  Primary Care Physician:  [name],[name], MD  Admission Diagnoses:  Active Problems:   Sickle cell disease with crisis [facility])   Discharge Medications:    Medication List    TAKE these medications        ALEVE 220 MG Caps  Generic drug:  Naproxen Sodium  Take 660 mg by mouth every 6 (six) hours as needed (pain).     BELBUCA 75 MCG Film  Generic drug:  Buprenorphine HCl  Place inside cheek 2 (two) times daily.     DULoxetine 30 MG capsule  Commonly known as:  CYMBALTA  Take 1 capsule (30 mg total) by mouth daily.     fluconazole 200 MG tablet  Commonly known as:  DIFLUCAN  Take 1 tablet (200 mg total) by mouth once.     folic acid 1 MG tablet  Commonly known as:  FOLVITE  Take 1 tablet (1 mg total) by mouth daily.     gabapentin 300 MG capsule  Commonly known as:  NEURONTIN  Take 1 capsule (300 mg total) by mouth 3 (three) times daily.     HYDROcodone-acetaminophen 10-325 MG tablet  Commonly known as:  NORCO  Take 1 tablet by mouth 3 (three) times daily as needed.     hydroxyurea 500 MG capsule  Commonly known as:  HYDREA  Take 2 capsules (1,000 mg total) by mouth daily. May take with food to minimize GI side effects.     oxyCODONE-acetaminophen 10-325 MG tablet  Commonly known as:  PERCOCET  Take 1 tablet by mouth every 6 (six) hours as needed for pain.     potassium chloride SA 20 MEQ tablet  Commonly known as:  K-DUR,KLOR-CON  Take 1 tablet (20 mEq total) by mouth daily.     Topiramate ER 100 MG Cp24  Take 100 mg by mouth at bedtime.     Vitamin D3 5000 UNITS Caps  Take 1 capsule (5,000 Units total) by mouth daily.         Consults:  None  Significant Diagnostic Studies:  No results found.   [facility] Course: Ms. Nicastro was admitted to the day infusion  center for extended observation. Reviewed labs, consistent with previous values.  Ms.Sailer was started on a high concentration PCA. She used a total of 16.5 mg with 33 demands and 33 deliveries. She was given oral Percocet 10-325 mg 45 minutes prior to discharge. She states that current pain intensity is 7/10, reduced from 9/10. Patient is functional and states that she can manage at home. She is to follow-up with pain management to discuss medication regimen. Also, patient is to follow up with primary provider as previously scheduled. Patient alert, oriented, and ambulatory.   Physical Exam at Discharge:  BP 111/76 mmHg  Pulse 94  Temp(Src) 98.1 F (36.7 C) (Oral)  Resp 16  Ht 5' (1.524 m)  Wt 112 lb (50.803 kg)  BMI 21.87 kg/m2  SpO2 100%  LMP 11/23/2014    General Appearance:    Alert, cooperative, no distress, appears stated age  Head:    Normocephalic, without obvious abnormality, atraumatic  Eyes:    PERRL, conjunctiva/corneas clear, EOM's intact, fundi    benign, both eyes  Ears:    Normal TM's and external ear canals, both ears  Nose:   Nares normal, septum midline, mucosa  normal, no drainage    or sinus tenderness  Throat:   Lips, mucosa, and tongue normal; teeth and gums normal  Neck:   Supple, symmetrical, trachea midline, no adenopathy;    thyroid:  no enlargement/tenderness/nodules; no carotid   bruit or JVD  Back:     Symmetric, no curvature, ROM normal, no CVA tenderness  Lungs:     Clear to auscultation bilaterally, respirations unlabored  Chest Wall:    No tenderness or deformity   Heart:    Regular rate and rhythm, S1 and S2 normal, no murmur, rub   or gallop  Abdomen:     Soft, non-tender, bowel sounds active all four quadrants,    no masses, no organomegaly  Extremities:   Extremities normal, atraumatic, no cyanosis or edema  Pulses:   2+ and symmetric all extremities  Skin:   Skin color, texture, turgor normal, no rashes or lesions  Lymph nodes:   Cervical,  supraclavicular, and axillary nodes normal  Neurologic:   CNII-XII intact, normal strength, sensation and reflexes    throughout   Disposition at Discharge: 01-Home or Self Care  Discharge Orders:     Discharge Instructions    Discharge patient    Complete by:  As directed            Condition at Discharge:   Stable  Time spent on Discharge:  Greater than 30 minutes.  Signed: Needham Biggins M 12/03/2014, 5:08 PM

## 2014-12-03 NOTE — H&P (Signed)
[facility] History and Physical   Date: 12/03/2014  Patient name: Brittany Foster Medical record number: [id number] Date of birth: 1985-08-26 Age: 29 y.o. Gender: female PCP: [name],[name], MD  Attending physician: Quentin Angst, MD  Chief Complaint: sickle cell pain  History of Present Illness: [name], a 29 year old patient with a history of sickle cell anemia, HbSS presents for generalized pain. Patient states that pain intensity has been increased over the past 2-3 days. Current pain intensity is 8/10 described as constant and throbbing. Patient states that she last had medications around 6 am with minimal relief. She states that she is taking all other medications consistently. Patient reports fatigue. She denies headache, shortness of breath, chest pains, nausea, vomiting, or diarrhea.  Meds: Prescriptions prior to admission  Medication Sig Dispense Refill Last Dose  . Buprenorphine HCl (BELBUCA) 75 MCG FILM Place inside cheek 2 (two) times daily.   Taking  . Cholecalciferol (VITAMIN D3) 5000 UNITS CAPS Take 1 capsule (5,000 Units total) by mouth daily. (Patient not taking: Reported on 11/09/2014) 30 capsule 5 More than a month at Unknown time  . DULoxetine (CYMBALTA) 30 MG capsule Take 1 capsule (30 mg total) by mouth daily. 30 capsule 3 11/23/2014 at Unknown time  . fluconazole (DIFLUCAN) 200 MG tablet Take 1 tablet (200 mg total) by mouth once. 2 tablet 0   . folic acid (FOLVITE) 1 MG tablet Take 1 tablet (1 mg total) by mouth daily. 30 tablet 11 11/23/2014 at Unknown time  . gabapentin (NEURONTIN) 300 MG capsule Take 1 capsule (300 mg total) by mouth 3 (three) times daily. 90 capsule 2 11/23/2014 at Unknown time  . HYDROcodone-acetaminophen (NORCO) 10-325 MG per tablet Take 1 tablet by mouth 3 (three) times daily as needed.   Not Taking  . hydroxyurea (HYDREA) 500 MG capsule Take 2 capsules (1,000 mg total) by mouth daily. May take with food to  minimize GI side effects. 60 capsule 3 11/23/2014 at Unknown time  . Naproxen Sodium (ALEVE) 220 MG CAPS Take 660 mg by mouth every 6 (six) hours as needed (pain).   Past Month at Unknown time  . oxyCODONE-acetaminophen (PERCOCET) 10-325 MG per tablet Take 1 tablet by mouth every 6 (six) hours as needed for pain. 100 tablet 0 11/23/2014 at Unknown time  . potassium chloride SA (K-DUR,KLOR-CON) 20 MEQ tablet Take 1 tablet (20 mEq total) by mouth daily. 30 tablet 1 11/13/2014 at Unknown time  . Topiramate ER 100 MG CP24 Take 100 mg by mouth at bedtime.   11/23/2014 at Unknown time    Allergies: Ultram and Morphine and related Past Medical History  Diagnosis Date  . Sickle cell anemia [facility])    Past Surgical History  Procedure Laterality Date  . Tubal ligation    . Cholecystectomy    . Cesarean section    . Port a cath placement Right     about 6-7 years ago  . Cholecystectomy  2000   Family History  Problem Relation Age of Onset  . Sickle cell anemia Other   . Sickle cell trait Father   . Sickle cell trait Mother    Social History   Social History  . Marital Status: Single    Spouse Name: N/A  . Number of Children: N/A  . Years of Education: N/A   Occupational History  . Not on file.   Social History Main Topics  . Smoking status: Former Games developer  . Smokeless  tobacco: Not on file     Comment: smokes once every couple of weeks.   . Alcohol Use: No  . Drug Use: No  . Sexual Activity: Yes    Birth Control/ Protection: Surgical   Other Topics Concern  . Not on file   Social History Narrative    Review of Systems: Constitutional: positive for fatigue Eyes: negative Ears, nose, mouth, throat, and face: negative Respiratory: negative for cough and dyspnea on exertion Cardiovascular: negative for dyspnea, lower extremity edema and palpitations Gastrointestinal: negative for abdominal pain, diarrhea and nausea Genitourinary:negative Integument/breast:  negative Hematologic/lymphatic: negative Musculoskeletal:positive for myalgias Neurological: negative Behavioral/Psych: negative Endocrine: negative Allergic/Immunologic: negative  Physical Exam:  BP 103/67 mmHg  Pulse 98  Temp(Src) 98.6 F (37 C) (Oral)  Resp 16  Ht 5' (1.524 m)  Wt 112 lb (50.803 kg)  BMI 21.87 kg/m2  SpO2 100%  LMP 11/23/2014  General Appearance:    Alert, cooperative, mild distress, appears stated age  Head:    Normocephalic, without obvious abnormality, atraumatic  Eyes:    PERRL, conjunctiva/corneas clear, EOM's intact, fundi    benign, both eyes  Ears:    Normal TM's and external ear canals, both ears  Nose:   Nares normal, septum midline, mucosa normal, no drainage    or sinus tenderness  Throat:   Lips, mucosa, and tongue normal; teeth and gums normal  Neck:   Supple, symmetrical, trachea midline, no adenopathy;    thyroid:  no enlargement/tenderness/nodules; no carotid   bruit or JVD  Back:     Symmetric, no curvature, ROM normal, no CVA tenderness  Lungs:     Clear to auscultation bilaterally, respirations unlabored  Chest Wall:    No tenderness or deformity   Heart:    Regular rate and rhythm, S1 and S2 normal, no murmur, rub   or gallop  Abdomen:     Soft, non-tender, bowel sounds active all four quadrants,    no masses, no organomegaly  Extremities:   Extremities normal, atraumatic, no cyanosis or edema  Pulses:   2+ and symmetric all extremities  Skin:   Skin color, texture, turgor normal, no rashes or lesions  Lymph nodes:   Cervical, supraclavicular, and axillary nodes normal  Neurologic:   CNII-XII intact, normal strength, sensation and reflexes    throughout    Lab results: No results found for this or any previous visit (from the past 24 hour(s)).  Imaging results:  No results found.   Assessment & Plan:  Patient will be admitted to the day infusion center for extended observation  Start IV D5.45 for cellular rehydration at  125/hr  Start Toradol 30 mg IV every 6 hours for inflammation.  Start Dilaudid PCA High Concentration per weight based protocol.   Patient will be re-evaluated for pain intensity in the context of function and relationship to baseline as care progresses.  If no significant pain relief, will transfer patient to inpatient services for a higher level of care.   Will check CMP,  LDH and CBC w/differential  Tyliek Timberman M 12/03/2014, 10:30 AM

## 2014-12-05 ENCOUNTER — Telehealth (HOSPITAL_COMMUNITY): Payer: Self-pay | Admitting: *Deleted

## 2014-12-06 ENCOUNTER — Telehealth (HOSPITAL_COMMUNITY): Payer: Self-pay | Admitting: Internal Medicine

## 2014-12-06 NOTE — Telephone Encounter (Signed)
Pt called and states experiencing pain in ribs, legs, and hips; denies CP, SOB, nausea, vomiting, and diarrhea; NP notified; pt informed to take home medications and increase fluid intake; pt verbalizes understanding

## 2014-12-07 ENCOUNTER — Telehealth (HOSPITAL_COMMUNITY): Payer: Self-pay

## 2014-12-07 ENCOUNTER — Non-Acute Institutional Stay (HOSPITAL_COMMUNITY)
Admission: AD | Admit: 2014-12-07 | Discharge: 2014-12-07 | Disposition: A | Discharge status: Home / Self Care | Payer: Medicaid Other | Attending: Internal Medicine | Admitting: Internal Medicine

## 2014-12-07 ENCOUNTER — Encounter (HOSPITAL_COMMUNITY): Payer: Self-pay

## 2014-12-07 ENCOUNTER — Ambulatory Visit: Payer: Medicaid Other | Admitting: Family Medicine

## 2014-12-07 DIAGNOSIS — Z79899 Other long term (current) drug therapy: Secondary | ICD-10-CM | POA: Insufficient documentation

## 2014-12-07 DIAGNOSIS — Z791 Long term (current) use of non-steroidal anti-inflammatories (NSAID): Secondary | ICD-10-CM | POA: Diagnosis not present

## 2014-12-07 DIAGNOSIS — D5700 Hb-SS disease with crisis, unspecified: Secondary | ICD-10-CM | POA: Diagnosis present

## 2014-12-07 DIAGNOSIS — Z79891 Long term (current) use of opiate analgesic: Secondary | ICD-10-CM | POA: Diagnosis not present

## 2014-12-07 DIAGNOSIS — D57819 Other sickle-cell disorders with crisis, unspecified: Secondary | ICD-10-CM | POA: Diagnosis not present

## 2014-12-07 DIAGNOSIS — R52 Pain, unspecified: Secondary | ICD-10-CM | POA: Diagnosis present

## 2014-12-07 DIAGNOSIS — Z87891 Personal history of nicotine dependence: Secondary | ICD-10-CM | POA: Insufficient documentation

## 2014-12-07 MED ORDER — OXYCODONE HCL 5 MG PO TABS
5.0000 mg | ORAL_TABLET | Freq: Once | ORAL | Status: AC
  Administered 2014-12-07: 5 mg via ORAL
  Filled 2014-12-07: qty 1

## 2014-12-07 MED ORDER — OXYCODONE-ACETAMINOPHEN 5-325 MG PO TABS
1.0000 | ORAL_TABLET | Freq: Once | ORAL | Status: AC
  Administered 2014-12-07: 1 via ORAL
  Filled 2014-12-07: qty 1

## 2014-12-07 MED ORDER — NALOXONE HCL 0.4 MG/ML IJ SOLN: 0.4000 mg | INTRAMUSCULAR | Status: DC | PRN

## 2014-12-07 MED ORDER — HYDROMORPHONE 2 MG/ML HIGH CONCENTRATION IV PCA SOLN
INTRAVENOUS | Status: DC
  Administered 2014-12-07: 14.5 mg via INTRAVENOUS
  Administered 2014-12-07: 11:00:00 via INTRAVENOUS
  Filled 2014-12-07: qty 25

## 2014-12-07 MED ORDER — ONDANSETRON HCL 4 MG/2ML IJ SOLN: 4.0000 mg | Freq: Four times a day (QID) | INTRAMUSCULAR | Status: DC | PRN

## 2014-12-07 MED ORDER — DIPHENHYDRAMINE HCL 12.5 MG/5ML PO ELIX: 12.5000 mg | ORAL_SOLUTION | Freq: Four times a day (QID) | ORAL | Status: DC | PRN

## 2014-12-07 MED ORDER — HEPARIN SOD (PORK) LOCK FLUSH 100 UNIT/ML IV SOLN
500.0000 [IU] | INTRAVENOUS | Status: AC | PRN
Start: 2014-12-07 — End: 2014-12-07
  Administered 2014-12-07: 500 [IU]
  Filled 2014-12-07: qty 5

## 2014-12-07 MED ORDER — DEXTROSE-NACL 5-0.45 % IV SOLN
INTRAVENOUS | Status: DC
Start: 2014-12-07 — End: 2014-12-07
  Administered 2014-12-07: 11:00:00 via INTRAVENOUS

## 2014-12-07 MED ORDER — SODIUM CHLORIDE 0.9 % IJ SOLN
10.0000 mL | INTRAMUSCULAR | Status: AC | PRN
  Administered 2014-12-07: 10 mL

## 2014-12-07 MED ORDER — KETOROLAC TROMETHAMINE 30 MG/ML IJ SOLN
30.0000 mg | Freq: Four times a day (QID) | INTRAMUSCULAR | Status: DC
  Administered 2014-12-07: 30 mg via INTRAVENOUS
  Filled 2014-12-07: qty 1

## 2014-12-07 MED ORDER — SODIUM CHLORIDE 0.9 % IJ SOLN: 9.0000 mL | INTRAMUSCULAR | Status: DC | PRN

## 2014-12-07 MED ORDER — SODIUM CHLORIDE 0.9 % IV SOLN
12.5000 mg | Freq: Four times a day (QID) | INTRAVENOUS | Status: DC | PRN
  Administered 2014-12-07: 12.5 mg via INTRAVENOUS
  Filled 2014-12-07 (×4): qty 0.25

## 2014-12-07 NOTE — Progress Notes (Signed)
Patient seen at the day hospital today. Rates pain 7/10 at discharge. Port accessed and de accessed per protocol, Patient alert, oriented and ambulatory. Discharge instructions given. Patient has no other concern at this time

## 2014-12-07 NOTE — Discharge Summary (Signed)
[facility] Discharge Summary   Patient ID: Brittany Foster MRN: [id number] DOB/AGE: 08-04-1985 29 y.o.  Admit date: 12/07/2014 Discharge date: 12/07/2014  Primary Care Physician:  [name],[name], MD  Admission Diagnoses:  Active Problems:   Sickle-cell disease with pain [facility])  Discharge Medications:    Medication List    ASK your doctor about these medications        ALEVE 220 MG Caps  Generic drug:  Naproxen Sodium  Take 660 mg by mouth every 6 (six) hours as needed (pain).     BELBUCA 75 MCG Film  Generic drug:  Buprenorphine HCl  Place inside cheek 2 (two) times daily.     DULoxetine 30 MG capsule  Commonly known as:  CYMBALTA  Take 1 capsule (30 mg total) by mouth daily.     fluconazole 200 MG tablet  Commonly known as:  DIFLUCAN  Take 1 tablet (200 mg total) by mouth once.     folic acid 1 MG tablet  Commonly known as:  FOLVITE  Take 1 tablet (1 mg total) by mouth daily.     gabapentin 300 MG capsule  Commonly known as:  NEURONTIN  Take 1 capsule (300 mg total) by mouth 3 (three) times daily.     HYDROcodone-acetaminophen 10-325 MG tablet  Commonly known as:  NORCO  Take 1 tablet by mouth 3 (three) times daily as needed.     hydroxyurea 500 MG capsule  Commonly known as:  HYDREA  Take 2 capsules (1,000 mg total) by mouth daily. May take with food to minimize GI side effects.     oxyCODONE-acetaminophen 10-325 MG tablet  Commonly known as:  PERCOCET  Take 1 tablet by mouth every 6 (six) hours as needed for pain.     potassium chloride SA 20 MEQ tablet  Commonly known as:  K-DUR,KLOR-CON  Take 1 tablet (20 mEq total) by mouth daily.     Topiramate ER 100 MG Cp24  Take 100 mg by mouth at bedtime.     Vitamin D3 5000 UNITS Caps  Take 1 capsule (5,000 Units total) by mouth daily.         Consults:  None  Significant Diagnostic Studies:  No results found.   [facility] Course:  [name] was  admitted to the day infusion center for extended observation. Patient was started on a high concentration dilaudid PCA. She used a total of 14.5 mg with 36 demands and 29 deliveries. Patient was given  Percocet 10-325 mg 30 minutes prior to discharge. Scheduled a follow-up appointment with Dr. Hyman Hopes on 12/18/2014 at 10 am, patient expressed understanding. She is alert, oriented, and ambulatory. Patient encouraged to hydrate and take medications consistently.   Physical Exam at Discharge: BP 105/61 mmHg  Pulse 85  Temp(Src) 98.3 F (36.8 C) (Oral)  Resp 15  Ht 5' (1.524 m)  Wt 112 lb (50.803 kg)  BMI 21.87 kg/m2  SpO2 100%  PF   LMP 11/23/2014  General Appearance:    Alert, cooperative, no distress, appears stated age  Head:    Normocephalic, without obvious abnormality, atraumatic  Eyes:    PERRL, conjunctiva/corneas clear, EOM's intact, fundi    benign, both eyes  Back:     Symmetric, no curvature, ROM normal, no CVA tenderness  Lungs:     Clear to auscultation bilaterally, respirations unlabored  Chest Wall:    No tenderness or deformity   Heart:    Regular rate and rhythm,  S1 and S2 normal, no murmur, rub   or gallop  Abdomen:     Soft, non-tender, bowel sounds active all four quadrants,    no masses, no organomegaly     Extremities:   Extremities normal, atraumatic, no cyanosis or edema  Pulses:   2+ and symmetric all extremities  Skin:   Skin color, texture, turgor normal, no rashes or lesions  Lymph nodes:   Cervical, supraclavicular, and axillary nodes normal  Neurologic:   CNII-XII intact, normal strength, sensation and reflexes    throughout   Disposition at Discharge: 01-Home or Self Care  Discharge Orders:   Condition at Discharge:   Stable  Time spent on Discharge:  Greater than 30 minutes.  Signed: Arleigh Dicola M 12/07/2014, 4:22 PM

## 2014-12-07 NOTE — Telephone Encounter (Signed)
Patient called complaining of pain all over. Rates 8/10. Last pain medication 10 mg oxycodone taken at 0600. Denies fever, chest pain, SOB, nausea/ vomiting or diarrhea. Patient would like to come to day hospital. Patient told I would contact provider and call her back. Patient verbalizes understanding.

## 2014-12-07 NOTE — H&P (Signed)
[facility] History and Physical   Date: 12/07/2014  Patient name: Brittany Foster Medical record number: [id number] Date of birth: November 03, 1985 Age: 29 y.o. Gender: female PCP: [name],[name], MD  Attending physician: Quentin Angst, MD  Chief Complaint: sickle cell pain  History of Present Illness: [name], a 29 year old patient with a history of sickle cell anemia, HbSS presents for generalized pain. Patient was admitted to the day infusion center on 12/03/2014. She was advised to follow up with pain management. She states that she has an appointment scheduled on 12/20/2014.  Patient states that pain intensity has been increased over the past several weeks. Pain intensity is not controlled on current medication regimen. Current pain intensity is 8/10 described as constant and throbbing. Patient states that she last had medications around 6 am with minimal relief. She states that she is taking all other medications consistently. Patient reports fatigue. She denies headache, shortness of breath, chest pains, nausea, vomiting, or diarrhea.  Meds: Prescriptions prior to admission  Medication Sig Dispense Refill Last Dose  . Buprenorphine HCl (BELBUCA) 75 MCG FILM Place inside cheek 2 (two) times daily.   12/02/2014 at Unknown time  . Cholecalciferol (VITAMIN D3) 5000 UNITS CAPS Take 1 capsule (5,000 Units total) by mouth daily. 30 capsule 5 More than a month at Unknown time  . DULoxetine (CYMBALTA) 30 MG capsule Take 1 capsule (30 mg total) by mouth daily. 30 capsule 3 12/03/2014 at Unknown time  . fluconazole (DIFLUCAN) 200 MG tablet Take 1 tablet (200 mg total) by mouth once. 2 tablet 0   . folic acid (FOLVITE) 1 MG tablet Take 1 tablet (1 mg total) by mouth daily. 30 tablet 11 12/03/2014 at Unknown time  . gabapentin (NEURONTIN) 300 MG capsule Take 1 capsule (300 mg total) by mouth 3 (three) times daily. 90 capsule 2 12/02/2014 at Unknown time  .  HYDROcodone-acetaminophen (NORCO) 10-325 MG per tablet Take 1 tablet by mouth 3 (three) times daily as needed.   Not Taking  . hydroxyurea (HYDREA) 500 MG capsule Take 2 capsules (1,000 mg total) by mouth daily. May take with food to minimize GI side effects. 60 capsule 3 12/03/2014 at Unknown time  . Naproxen Sodium (ALEVE) 220 MG CAPS Take 660 mg by mouth every 6 (six) hours as needed (pain).   12/02/2014 at Unknown time  . oxyCODONE-acetaminophen (PERCOCET) 10-325 MG per tablet Take 1 tablet by mouth every 6 (six) hours as needed for pain. 100 tablet 0 12/03/2014 at Unknown time  . potassium chloride SA (K-DUR,KLOR-CON) 20 MEQ tablet Take 1 tablet (20 mEq total) by mouth daily. 30 tablet 1 11/13/2014 at Unknown time  . Topiramate ER 100 MG CP24 Take 100 mg by mouth at bedtime.   12/02/2014 at Unknown time    Allergies: Ultram and Morphine and related Past Medical History  Diagnosis Date  . Sickle cell anemia [facility])    Past Surgical History  Procedure Laterality Date  . Tubal ligation    . Cholecystectomy    . Cesarean section    . Port a cath placement Right     about 6-7 years ago  . Cholecystectomy  2000   Family History  Problem Relation Age of Onset  . Sickle cell anemia Other   . Sickle cell trait Father   . Sickle cell trait Mother    Social History   Social History  . Marital Status: Single    Spouse Name: N/A  .  Number of Children: N/A  . Years of Education: N/A   Occupational History  . Not on file.   Social History Main Topics  . Smoking status: Former Games developer  . Smokeless tobacco: Not on file     Comment: smokes once every couple of weeks.   . Alcohol Use: No  . Drug Use: No  . Sexual Activity: Yes    Birth Control/ Protection: Surgical   Other Topics Concern  . Not on file   Social History Narrative    Review of Systems: Constitutional: positive for fatigue Eyes: negative Ears, nose, mouth, throat, and face: negative Respiratory: negative for cough  and dyspnea on exertion Cardiovascular: negative for dyspnea, lower extremity edema and palpitations Gastrointestinal: negative for abdominal pain, diarrhea and nausea Genitourinary:negative Integument/breast: negative Hematologic/lymphatic: negative Musculoskeletal:positive for myalgias Neurological: negative Behavioral/Psych: negative Endocrine: negative Allergic/Immunologic: negative  Physical Exam:  LMP 11/23/2014  General Appearance:    Alert, cooperative, mild distress, appears stated age  Head:    Normocephalic, without obvious abnormality, atraumatic  Eyes:    PERRL, conjunctiva/corneas clear, EOM's intact, fundi    benign, both eyes  Ears:    Normal TM's and external ear canals, both ears  Nose:   Nares normal, septum midline, mucosa normal, no drainage    or sinus tenderness  Throat:   Lips, mucosa, and tongue normal; teeth and gums normal  Neck:   Supple, symmetrical, trachea midline, no adenopathy;    thyroid:  no enlargement/tenderness/nodules; no carotid   bruit or JVD  Back:     Symmetric, no curvature, ROM normal, no CVA tenderness  Lungs:     Clear to auscultation bilaterally, respirations unlabored  Chest Wall:    No tenderness or deformity   Heart:    Regular rate and rhythm, S1 and S2 normal, no murmur, rub   or gallop  Abdomen:     Soft, non-tender, bowel sounds active all four quadrants,    no masses, no organomegaly  Extremities:   Extremities normal, atraumatic, no cyanosis or edema  Pulses:   2+ and symmetric all extremities  Skin:   Skin color, texture, turgor normal, no rashes or lesions  Lymph nodes:   Cervical, supraclavicular, and axillary nodes normal  Neurologic:   CNII-XII intact, normal strength, sensation and reflexes    throughout    Lab results: No results found for this or any previous visit (from the past 24 hour(s)).  Imaging results:  No results found.   Assessment & Plan:  Patient will be admitted to the day infusion center  for extended observation  Start IV D5.45 for cellular rehydration at 125/hr  Start Toradol 30 mg IV every 6 hours for inflammation.  Start Dilaudid PCA High Concentration per weight based protocol.   Patient will be re-evaluated for pain intensity in the context of function and relationship to baseline as care progresses.  If no significant pain relief, will transfer patient to inpatient services for a higher level of care.    Bryah Ocheltree M 12/07/2014, 10:22 AM

## 2014-12-07 NOTE — Telephone Encounter (Signed)
After speaking with NP Hart Rochester, returned patient phone call. Told she could come to the day hospital and she should be here by 10 am. Patient verbalizes understanding.

## 2014-12-11 ENCOUNTER — Telehealth (HOSPITAL_COMMUNITY): Payer: Self-pay

## 2014-12-11 NOTE — Telephone Encounter (Signed)
After speaking with NP Hart Rochester, Returned patient phone call, told to continue taking her medications as prescribed and to hydrate. Patient verbalizes understanding.

## 2014-12-11 NOTE — Telephone Encounter (Signed)
Patient called complaining of pain all over. Rates 8/10. Last pain medication oxycodone 10 mg taken at 0600. Denies fever, chest pain, nausea/ vomiting, SOB, or abdominal pain. Would like to come to day hospital.

## 2014-12-12 ENCOUNTER — Telehealth (HOSPITAL_COMMUNITY): Payer: Self-pay

## 2014-12-12 NOTE — Telephone Encounter (Signed)
After speaking with NP Hart RochesterHollis, returned patient phone call. Asked patient for the name of the pain clinic she goes to and told her provider would call them and try to get her in to see them prior to October 20 th. Also instructed patient to keep her appointment here on Tuesday. Continue taking medication as prescribed, increase fluid intake and to go to the ED if no improvement. Patient verbalizes understanding.

## 2014-12-12 NOTE — Telephone Encounter (Signed)
Patient called complaining of pain all over. Denies fever, chest pain, SOB, nausea, vomiting and diarrhea. She is complaining of abdominal pain. Patient states she went to the ED yesterday. She soul like to come to day hospital today. Told I would notify provider and call her back

## 2014-12-14 ENCOUNTER — Other Ambulatory Visit: Payer: Self-pay | Admitting: Internal Medicine

## 2014-12-18 ENCOUNTER — Ambulatory Visit: Payer: Medicaid Other | Admitting: Internal Medicine

## 2014-12-19 ENCOUNTER — Telehealth: Payer: Self-pay | Admitting: Family Medicine

## 2014-12-19 ENCOUNTER — Telehealth (HOSPITAL_COMMUNITY): Payer: Self-pay

## 2014-12-19 NOTE — Telephone Encounter (Signed)
Patient called complaining of pain all over 8/10. Last pain medication taken yesterday. Would like to come to day hospital for treatment. Told patient I would call her back after speaking with provider.

## 2014-12-19 NOTE — Telephone Encounter (Signed)
After speaking with provider, returned patient phone call. Patient missed appointment with Dr. Hyman Foster yesterday 12/19/14. and needs to reschedule. Also NP Brittany Foster contacted the pain clinic where Brittany Foster is being seen, she was told that Brittany Foster has not picked up a prescription for her long acting medication since August 2016.  Brittany Foster was told that she needs to take her medications as prescribed. Patient became very upset and irate. Patient  Was told that RN was the messenger and  had no control over whether she comes to the clinic. Patient asked to speak with provider. Patient told I would give her the message as she was seeing patients at this time.

## 2014-12-19 NOTE — Telephone Encounter (Signed)
[name] has been calling the day infusion center daily over the past several weeks for admission related to acute sickle cell pain. [facility]. Spoke with Brittany OrganAmanda Zimmerman, PA who states that [name] has not picked up Belbuca since 10/10/2014. [name] is not consistent with pain regimen. Explained to patient that her daily regimen is for chronic pain, the day infusion center is for patients that are experiencing an acute pain crisis. [name] had an appointment on 10/18 with Dr. Hyman Foster, primary provider and did not show up.  I will re-schedule a follow up with Dr. Hyman Foster and [name] will need for follow up with Brittany OrganAmanda Zimmerman, PA on 12/20/2014 [facility].    Brittany MaroonHollis,Brittany Rames M, FNP

## 2014-12-21 ENCOUNTER — Telehealth (HOSPITAL_COMMUNITY): Payer: Self-pay

## 2014-12-21 ENCOUNTER — Encounter (HOSPITAL_COMMUNITY): Payer: Self-pay

## 2014-12-21 ENCOUNTER — Non-Acute Institutional Stay (HOSPITAL_COMMUNITY)
Admission: AD | Admit: 2014-12-21 | Discharge: 2014-12-21 | Disposition: A | Discharge status: Home / Self Care | Payer: Medicaid Other | Attending: Internal Medicine | Admitting: Internal Medicine

## 2014-12-21 DIAGNOSIS — R52 Pain, unspecified: Secondary | ICD-10-CM | POA: Diagnosis present

## 2014-12-21 DIAGNOSIS — Z79899 Other long term (current) drug therapy: Secondary | ICD-10-CM | POA: Diagnosis not present

## 2014-12-21 DIAGNOSIS — Z87891 Personal history of nicotine dependence: Secondary | ICD-10-CM | POA: Diagnosis not present

## 2014-12-21 DIAGNOSIS — Z791 Long term (current) use of non-steroidal anti-inflammatories (NSAID): Secondary | ICD-10-CM | POA: Insufficient documentation

## 2014-12-21 DIAGNOSIS — D57819 Other sickle-cell disorders with crisis, unspecified: Secondary | ICD-10-CM

## 2014-12-21 DIAGNOSIS — D5700 Hb-SS disease with crisis, unspecified: Secondary | ICD-10-CM | POA: Diagnosis not present

## 2014-12-21 DIAGNOSIS — Z79891 Long term (current) use of opiate analgesic: Secondary | ICD-10-CM | POA: Insufficient documentation

## 2014-12-21 MED ORDER — ONDANSETRON HCL 4 MG/2ML IJ SOLN: 4.0000 mg | Freq: Four times a day (QID) | INTRAMUSCULAR | Status: DC | PRN

## 2014-12-21 MED ORDER — DIPHENHYDRAMINE HCL 12.5 MG/5ML PO ELIX: 12.5000 mg | ORAL_SOLUTION | Freq: Four times a day (QID) | ORAL | Status: DC | PRN

## 2014-12-21 MED ORDER — HEPARIN SOD (PORK) LOCK FLUSH 100 UNIT/ML IV SOLN
500.0000 [IU] | INTRAVENOUS | Status: AC | PRN
  Administered 2014-12-21: 500 [IU]
  Filled 2014-12-21: qty 5

## 2014-12-21 MED ORDER — OXYCODONE HCL 5 MG PO TABS
5.0000 mg | ORAL_TABLET | Freq: Once | ORAL | Status: AC
  Administered 2014-12-21: 5 mg via ORAL
  Filled 2014-12-21: qty 1

## 2014-12-21 MED ORDER — SODIUM CHLORIDE 0.9 % IJ SOLN: 9.0000 mL | INTRAMUSCULAR | Status: DC | PRN

## 2014-12-21 MED ORDER — KETOROLAC TROMETHAMINE 30 MG/ML IJ SOLN
30.0000 mg | Freq: Once | INTRAMUSCULAR | Status: AC
  Administered 2014-12-21: 30 mg via INTRAVENOUS
  Filled 2014-12-21: qty 1

## 2014-12-21 MED ORDER — HYDROMORPHONE 1 MG/ML IV SOLN
INTRAVENOUS | Status: DC
  Administered 2014-12-21: 18.5 mg via INTRAVENOUS
  Administered 2014-12-21: 10:00:00 via INTRAVENOUS
  Filled 2014-12-21: qty 25

## 2014-12-21 MED ORDER — OXYCODONE-ACETAMINOPHEN 5-325 MG PO TABS
1.0000 | ORAL_TABLET | Freq: Once | ORAL | Status: AC
  Administered 2014-12-21: 1 via ORAL
  Filled 2014-12-21: qty 1

## 2014-12-21 MED ORDER — DEXTROSE-NACL 5-0.45 % IV SOLN
INTRAVENOUS | Status: DC
  Administered 2014-12-21: 10:00:00 via INTRAVENOUS

## 2014-12-21 MED ORDER — SODIUM CHLORIDE 0.9 % IJ SOLN
10.0000 mL | INTRAMUSCULAR | Status: AC | PRN
  Administered 2014-12-21: 10 mL

## 2014-12-21 MED ORDER — NALOXONE HCL 0.4 MG/ML IJ SOLN: 0.4000 mg | INTRAMUSCULAR | Status: DC | PRN

## 2014-12-21 MED ORDER — SODIUM CHLORIDE 0.9 % IV SOLN
12.5000 mg | Freq: Four times a day (QID) | INTRAVENOUS | Status: DC | PRN
  Administered 2014-12-21: 12.5 mg via INTRAVENOUS
  Filled 2014-12-21 (×3): qty 0.25

## 2014-12-21 NOTE — Discharge Summary (Signed)
[facility] Discharge Summary   Patient ID: Brittany Foster Redel MRN: [id number] DOB/AGE: 30/03/1985 30 y.o.  Admit date: 12/21/2014 Discharge date: 12/21/2014  Primary Care Physician:  [name],[name], MD  Admission Diagnoses:  Active Problems:   Sickle-cell disease with pain [facility](H[facility])  Discharge Medications:    Medication List    ASK your doctor about these medications        ALEVE 220 MG Caps  Generic drug:  Naproxen Sodium  Take 660 mg by mouth every 6 (six) hours as needed (pain).     BELBUCA 75 MCG Film  Generic drug:  Buprenorphine HCl  Place inside cheek 2 (two) times daily.     DULoxetine 60 MG capsule  Commonly known as:  CYMBALTA  Take 60 mg by mouth.     fluconazole 200 MG tablet  Commonly known as:  DIFLUCAN  Take 1 tablet (200 mg total) by mouth once.     folic acid 1 MG tablet  Commonly known as:  FOLVITE  Take 1 tablet (1 mg total) by mouth daily.     gabapentin 300 MG capsule  Commonly known as:  NEURONTIN  Take 1 capsule (300 mg total) by mouth 3 (three) times daily.     HYDROcodone-acetaminophen 10-325 MG tablet  Commonly known as:  NORCO  Take 1 tablet by mouth 3 (three) times daily as needed.     hydroxyurea 500 MG capsule  Commonly known as:  HYDREA  Take 2 capsules (1,000 mg total) by mouth daily. May take with food to minimize GI side effects.     oxyCODONE-acetaminophen 10-325 MG tablet  Commonly known as:  PERCOCET  Take 1 tablet by mouth every 6 (six) hours as needed for pain.     potassium chloride SA 20 MEQ tablet  Commonly known as:  K-DUR,KLOR-CON  Take 1 tablet (20 mEq total) by mouth daily.     Topiramate ER 100 MG Cp24  Take 100 mg by mouth at bedtime.     Vitamin D3 5000 UNITS Caps  Take 1 capsule (5,000 Units total) by mouth daily.         Consults:  None  Significant Diagnostic Studies:  No results found.   [facility] Course:  [name] Sobczyk was admitted to the day  infusion center for extended observation. Patient was started on a high concentration dilaudid PCA. She used a total of 18.5 mg with 37 demands and 39 deliveries. Patient was given  Percocet 10-325 mg 30 minutes prior to discharge. Patient was reminded to schedule a follow up appointment with Dr. Hyman HopesJegede, she missed appointment on 10/18/20156 due to hospitalization. Patient expressed understanding. She is alert, oriented, and ambulatory. Patient encouraged to hydrate and take medications consistently.   Physical Exam at Discharge: BP 96/61 mmHg  Pulse 90  Temp(Src) 98.3 F (36.8 C) (Oral)  Resp 13  Ht 5' (1.524 m)  Wt 112 lb (50.803 kg)  BMI 21.87 kg/m2  SpO2 96%  PF   LMP 12/21/2014  General Appearance:    Alert, cooperative, no distress, appears stated age  Head:    Normocephalic, without obvious abnormality, atraumatic  Eyes:    PERRL, conjunctiva/corneas clear, EOM's intact, fundi    benign, both eyes  Back:     Symmetric, no curvature, ROM normal, no CVA tenderness  Lungs:     Clear to auscultation bilaterally, respirations unlabored  Chest Wall:    No tenderness or deformity   Heart:  Regular rate and rhythm, S1 and S2 normal, no murmur, rub   or gallop  Abdomen:     Soft, non-tender, bowel sounds active all four quadrants,    no masses, no organomegaly     Extremities:   Extremities normal, atraumatic, no cyanosis or edema  Pulses:   2+ and symmetric all extremities  Skin:   Skin color, texture, turgor normal, no rashes or lesions  Lymph nodes:   Cervical, supraclavicular, and axillary nodes normal  Neurologic:   CNII-XII intact, normal strength, sensation and reflexes    throughout   Disposition at Discharge: 01-Home or Self Care  Discharge Orders:   Condition at Discharge:   Stable  Time spent on Discharge:  15 minutes  Signed: Olga Seyler M 12/21/2014, 4:33 PM

## 2014-12-21 NOTE — Progress Notes (Signed)
Patient treated at the day hospital today. Pain 6/10 at discharge. Patient alert, oriented, and ambulatory at this time. Patient reminded to re schedule appointment with Dr. Hyman HopesJegede. Patient verbalizes understanding. Patient denies any further needs at this time.

## 2014-12-21 NOTE — Discharge Instructions (Signed)
Sickle Cell Anemia, Adult Sickle cell anemia is a condition in which red blood cells have an abnormal "sickle" shape. This abnormal shape shortens the cells' life span, which results in a lower than normal concentration of red blood cells in the blood. The sickle shape also causes the cells to clump together and block free blood flow through the blood vessels. As a result, the tissues and organs of the body do not receive enough oxygen. Sickle cell anemia causes organ damage and pain and increases the risk of infection. CAUSES  Sickle cell anemia is a genetic disorder. Those who receive two copies of the gene have the condition, and those who receive one copy have the trait. RISK FACTORS The sickle cell gene is most common in people whose families originated in [place]. Other areas of the globe where sickle cell trait occurs include the Mediterranean, [place] and [place], the [place], and the [place].  SIGNS AND SYMPTOMS  Pain, especially in the extremities, back, chest, or abdomen (common). The pain may start suddenly or may develop following an illness, especially if there is dehydration. Pain can also occur due to overexertion or exposure to extreme temperature changes.  Frequent severe bacterial infections, especially certain types of pneumonia and meningitis.  Pain and swelling in the hands and feet.  Decreased activity.   Loss of appetite.   Change in behavior.  Headaches.  Seizures.  Shortness of breath or difficulty breathing.  Vision changes.  Skin ulcers. Those with the trait may not have symptoms or they may have mild symptoms.  DIAGNOSIS  Sickle cell anemia is diagnosed with blood tests that demonstrate the genetic trait. It is often diagnosed during the newborn period, due to mandatory testing nationwide. A variety of blood tests, X-rays, CT scans, MRI scans, ultrasounds, and lung function tests may also be done to monitor the condition. TREATMENT  Sickle  cell anemia may be treated with:  Medicines. You may be given pain medicines, antibiotic medicines (to treat and prevent infections) or medicines to increase the production of certain types of hemoglobin.  Fluids.  Oxygen.  Blood transfusions. HOME CARE INSTRUCTIONS   Drink enough fluid to keep your urine clear or pale yellow. Increase your fluid intake in hot weather and during exercise.  Do not smoke. Smoking lowers oxygen levels in the blood.   Only take over-the-counter or prescription medicines for pain, fever, or discomfort as directed by your health care provider.  Take antibiotics as directed by your health care provider. Make sure you finish them it even if you start to feel better.   Take supplements as directed by your health care provider.   Consider wearing a medical alert bracelet. This tells anyone caring for you in an emergency of your condition.   When traveling, keep your medical information, health care provider's names, and the medicines you take with you at all times.   If you develop a fever, do not take medicines to reduce the fever right away. This could cover up a problem that is developing. Notify your health care provider.  Keep all follow-up appointments with your health care provider. Sickle cell anemia requires regular medical care. SEEK MEDICAL CARE IF: You have a fever. SEEK IMMEDIATE MEDICAL CARE IF:   You feel dizzy or faint.   You have new abdominal pain, especially on the left side near the stomach area.   You develop a persistent, often uncomfortable and painful penile erection (priapism). If this is not treated immediately it   will lead to impotence.   You have numbness your arms or legs or you have a hard time moving them.   You have a hard time with speech.   You have a fever or persistent symptoms for more than 2-3 days.   You have a fever and your symptoms suddenly get worse.   You have signs or symptoms of infection.  These include:   Chills.   Abnormal tiredness (lethargy).   Irritability.   Poor eating.   Vomiting.   You develop pain that is not helped with medicine.   You develop shortness of breath.  You have pain in your chest.   You are coughing up pus-like or bloody sputum.   You develop a stiff neck.  Your feet or hands swell or have pain.  Your abdomen appears bloated.  You develop joint pain. MAKE SURE YOU:  Understand these instructions.   This information is not intended to replace advice given to you by your health care provider. Make sure you discuss any questions you have with your health care provider.   Document Released: 05/27/2005 Document Revised: 03/09/2014 Document Reviewed: 09/28/2012 Elsevier Interactive Patient Education 2016 [employer]  

## 2014-12-21 NOTE — H&P (Signed)
[facility] History and Physical   Date: 12/21/2014  Patient name: Brittany Foster Medical record number: [id number] Date of birth: 01/21/1986 Age: 29 y.o. Gender: female PCP: [name],[name], MD  Attending physician: Quentin Angst, MD  Chief Complaint: sickle cell pain  History of Present Illness: Brittany Foster, a 29 year old patient with a history of sickle cell anemia, HbSS presents for generalized pain. She attributes current pain crisis to changes in weather. She was advised to follow up with pain management, she states that she had an appointment on yesterday and they added long acting medication.   Patient states that pain intensity has been increased over the past several weeks. Pain intensity is not controlled on current medication regimen. Current pain intensity is 8/10 described as constant and throbbing. Patient states that she last had medications around 6 am with minimal relief. She states that she is taking all other medications consistently. Patient reports fatigue. She denies headache, shortness of breath, chest pains, nausea, vomiting, or diarrhea.  Meds: Prescriptions prior to admission  Medication Sig Dispense Refill Last Dose  . Buprenorphine HCl (BELBUCA) 75 MCG FILM Place inside cheek 2 (two) times daily.   12/21/2014 at Unknown time  . DULoxetine (CYMBALTA) 60 MG capsule Take 60 mg by mouth.   12/21/2014 at Unknown time  . folic acid (FOLVITE) 1 MG tablet Take 1 tablet (1 mg total) by mouth daily. 30 tablet 11 12/21/2014 at Unknown time  . HYDROcodone-acetaminophen (NORCO) 10-325 MG per tablet Take 1 tablet by mouth 3 (three) times daily as needed.   12/21/2014 at Unknown time  . hydroxyurea (HYDREA) 500 MG capsule Take 2 capsules (1,000 mg total) by mouth daily. May take with food to minimize GI side effects. 60 capsule 3 12/21/2014 at Unknown time  . oxyCODONE-acetaminophen (PERCOCET) 10-325 MG per tablet Take 1 tablet by mouth every 6  (six) hours as needed for pain. 100 tablet 0 12/21/2014 at Unknown time  . Topiramate ER 100 MG CP24 Take 100 mg by mouth at bedtime.   12/20/2014 at Unknown time  . Cholecalciferol (VITAMIN D3) 5000 UNITS CAPS Take 1 capsule (5,000 Units total) by mouth daily. 30 capsule 5 Unknown at Unknown time  . fluconazole (DIFLUCAN) 200 MG tablet Take 1 tablet (200 mg total) by mouth once. 2 tablet 0 Unknown at Unknown time  . gabapentin (NEURONTIN) 300 MG capsule Take 1 capsule (300 mg total) by mouth 3 (three) times daily. 90 capsule 2 Unknown at Unknown time  . Naproxen Sodium (ALEVE) 220 MG CAPS Take 660 mg by mouth every 6 (six) hours as needed (pain).   Unknown at Unknown time  . potassium chloride SA (K-DUR,KLOR-CON) 20 MEQ tablet Take 1 tablet (20 mEq total) by mouth daily. 30 tablet 1 Unknown at Unknown time    Allergies: Ultram and Morphine and related Past Medical History  Diagnosis Date  . Sickle cell anemia [facility])    Past Surgical History  Procedure Laterality Date  . Tubal ligation    . Cholecystectomy    . Cesarean section    . Port a cath placement Right     about 6-7 years ago  . Cholecystectomy  2000   Family History  Problem Relation Age of Onset  . Sickle cell anemia Other   . Sickle cell trait Father   . Sickle cell trait Mother    Social History   Social History  . Marital Status: Single  Spouse Name: N/A  . Number of Children: N/A  . Years of Education: N/A   Occupational History  . Not on file.   Social History Main Topics  . Smoking status: Former Games developermoker  . Smokeless tobacco: Not on file     Comment: smokes once every couple of weeks.   . Alcohol Use: No  . Drug Use: No  . Sexual Activity: Yes    Birth Control/ Protection: Surgical   Other Topics Concern  . Not on file   Social History Narrative    Review of Systems: Constitutional: positive for fatigue Eyes: negative Ears, nose, mouth, throat, and face: negative Respiratory: negative for  cough and dyspnea on exertion Cardiovascular: negative for dyspnea, lower extremity edema and palpitations Gastrointestinal: negative for abdominal pain, diarrhea and nausea Genitourinary:negative Integument/breast: negative Hematologic/lymphatic: negative Musculoskeletal:positive for myalgias Neurological: negative Behavioral/Psych: negative Endocrine: negative Allergic/Immunologic: negative  Physical Exam:  BP 109/65 mmHg  Pulse 91  Temp(Src) 98 F (36.7 C) (Oral)  Resp 18  Ht 5' (1.524 m)  Wt 112 lb (50.803 kg)  BMI 21.87 kg/m2  SpO2 98%  PF   LMP 12/21/2014  General Appearance:    Alert, cooperative, mild distress, appears stated age  Head:    Normocephalic, without obvious abnormality, atraumatic  Eyes:    PERRL, conjunctiva/corneas clear, EOM's intact, fundi    benign, both eyes  Ears:    Normal TM's and external ear canals, both ears  Nose:   Nares normal, septum midline, mucosa normal, no drainage    or sinus tenderness  Throat:   Lips, mucosa, and tongue normal; teeth and gums normal  Neck:   Supple, symmetrical, trachea midline, no adenopathy;    thyroid:  no enlargement/tenderness/nodules; no carotid   bruit or JVD  Back:     Symmetric, no curvature, ROM normal, no CVA tenderness  Lungs:     Clear to auscultation bilaterally, respirations unlabored  Chest Wall:    No tenderness or deformity   Heart:    Regular rate and rhythm, S1 and S2 normal, no murmur, rub   or gallop  Abdomen:     Soft, non-tender, bowel sounds active all four quadrants,    no masses, no organomegaly  Extremities:   Extremities normal, atraumatic, no cyanosis or edema  Pulses:   2+ and symmetric all extremities  Skin:   Skin color, texture, turgor normal, no rashes or lesions  Lymph nodes:   Cervical, supraclavicular, and axillary nodes normal  Neurologic:   CNII-XII intact, normal strength, sensation and reflexes    throughout    Lab results: No results found for this or any previous  visit (from the past 24 hour(s)).  Imaging results:  No results found.   Assessment & Plan:  Patient will be admitted to the day infusion center for extended observation  Start IV D5.45 for cellular rehydration at 125/hr  Start Toradol 30 mg IV every 6 hours for inflammation.  Start Dilaudid PCA High Concentration per weight based protocol.   Patient will be re-evaluated for pain intensity in the context of function and relationship to baseline as care progresses.  If no significant pain relief, will transfer patient to inpatient services for a higher level of care.   Review[facility]17/2014 [facility], consistent with previous values.    Tracy Gerken M 12/21/2014, 9:40 AM

## 2014-12-21 NOTE — Telephone Encounter (Signed)
Patient called complaining of pain all over. Last pain medication oxycodone 10 mg along with her long acting medication was taken at 0600 am. Denies fever, chest pain, SOB, nausea/ vomiting, or diarrhea. Would like to come to day hospital for treatment. Told I would contact provider and call her back. Patient verbalizes understanding.

## 2014-12-21 NOTE — Telephone Encounter (Signed)
Returned patient phone call, told she could come to the day hospital. Patient states she will be here in 30 minutes.

## 2014-12-26 ENCOUNTER — Other Ambulatory Visit: Payer: Self-pay | Admitting: Family Medicine

## 2014-12-26 ENCOUNTER — Encounter (HOSPITAL_COMMUNITY): Payer: Self-pay

## 2014-12-26 ENCOUNTER — Non-Acute Institutional Stay (HOSPITAL_COMMUNITY)
Admission: AD | Admit: 2014-12-26 | Discharge: 2014-12-26 | Disposition: A | Discharge status: Home / Self Care | Payer: Medicaid Other | Attending: Internal Medicine | Admitting: Internal Medicine

## 2014-12-26 ENCOUNTER — Telehealth (HOSPITAL_COMMUNITY): Payer: Self-pay | Admitting: Hematology

## 2014-12-26 DIAGNOSIS — Z79891 Long term (current) use of opiate analgesic: Secondary | ICD-10-CM | POA: Insufficient documentation

## 2014-12-26 DIAGNOSIS — Z79899 Other long term (current) drug therapy: Secondary | ICD-10-CM | POA: Insufficient documentation

## 2014-12-26 DIAGNOSIS — Z87891 Personal history of nicotine dependence: Secondary | ICD-10-CM | POA: Diagnosis not present

## 2014-12-26 DIAGNOSIS — D5700 Hb-SS disease with crisis, unspecified: Secondary | ICD-10-CM | POA: Diagnosis present

## 2014-12-26 DIAGNOSIS — E875 Hyperkalemia: Secondary | ICD-10-CM

## 2014-12-26 DIAGNOSIS — R52 Pain, unspecified: Secondary | ICD-10-CM | POA: Diagnosis present

## 2014-12-26 LAB — COMPREHENSIVE METABOLIC PANEL
ALT: 22 U/L (ref 14–54)
AST: 33 U/L (ref 15–41)
Albumin: 3.9 g/dL (ref 3.5–5.0)
Alkaline Phosphatase: 86 U/L (ref 38–126)
Anion gap: 6 (ref 5–15)
BUN: 5 mg/dL — ABNORMAL LOW (ref 6–20)
CHLORIDE: 112 mmol/L — AB (ref 101–111)
CO2: 23 mmol/L (ref 22–32)
Calcium: 8.4 mg/dL — ABNORMAL LOW (ref 8.9–10.3)
Creatinine, Ser: 0.32 mg/dL — ABNORMAL LOW (ref 0.44–1.00)
Glucose, Bld: 92 mg/dL (ref 65–99)
POTASSIUM: 2.6 mmol/L — AB (ref 3.5–5.1)
SODIUM: 141 mmol/L (ref 135–145)
Total Bilirubin: 1.6 mg/dL — ABNORMAL HIGH (ref 0.3–1.2)
Total Protein: 6.7 g/dL (ref 6.5–8.1)

## 2014-12-26 LAB — CBC WITH DIFFERENTIAL/PLATELET
Basophils Absolute: 0 10*3/uL (ref 0.0–0.1)
Basophils Relative: 0 %
Eosinophils Absolute: 0 10*3/uL (ref 0.0–0.7)
Eosinophils Relative: 0 %
HEMATOCRIT: 23.2 % — AB (ref 36.0–46.0)
HEMOGLOBIN: 8.1 g/dL — AB (ref 12.0–15.0)
LYMPHS ABS: 2.7 10*3/uL (ref 0.7–4.0)
LYMPHS PCT: 34 %
MCH: 36.3 pg — AB (ref 26.0–34.0)
MCHC: 34.9 g/dL (ref 30.0–36.0)
MCV: 104 fL — AB (ref 78.0–100.0)
Monocytes Absolute: 0.6 10*3/uL (ref 0.1–1.0)
Monocytes Relative: 8 %
NEUTROS ABS: 4.5 10*3/uL (ref 1.7–7.7)
NEUTROS PCT: 58 %
Platelets: 285 10*3/uL (ref 150–400)
RBC: 2.23 MIL/uL — AB (ref 3.87–5.11)
RDW: 20 % — ABNORMAL HIGH (ref 11.5–15.5)
WBC: 7.8 10*3/uL (ref 4.0–10.5)

## 2014-12-26 LAB — LACTATE DEHYDROGENASE: LDH: 207 U/L — AB (ref 98–192)

## 2014-12-26 LAB — POTASSIUM: Potassium: 3.2 mmol/L — ABNORMAL LOW (ref 3.5–5.1)

## 2014-12-26 MED ORDER — POTASSIUM CHLORIDE CRYS ER 20 MEQ PO TBCR: 20.0000 meq | EXTENDED_RELEASE_TABLET | Freq: Every day | ORAL | Status: DC

## 2014-12-26 MED ORDER — SODIUM CHLORIDE 0.9 % IJ SOLN
10.0000 mL | INTRAMUSCULAR | Status: AC | PRN
  Administered 2014-12-26: 10 mL

## 2014-12-26 MED ORDER — HYDROMORPHONE 1 MG/ML IV SOLN
INTRAVENOUS | Status: DC
  Administered 2014-12-26: 11:00:00 via INTRAVENOUS
  Administered 2014-12-26: 15.5 mg via INTRAVENOUS
  Filled 2014-12-26: qty 25

## 2014-12-26 MED ORDER — DEXTROSE-NACL 5-0.45 % IV SOLN
INTRAVENOUS | Status: DC
  Administered 2014-12-26: 11:00:00 via INTRAVENOUS

## 2014-12-26 MED ORDER — SODIUM CHLORIDE 0.9 % IV SOLN
12.5000 mg | Freq: Four times a day (QID) | INTRAVENOUS | Status: DC | PRN
  Administered 2014-12-26: 12.5 mg via INTRAVENOUS
  Filled 2014-12-26: qty 0.25

## 2014-12-26 MED ORDER — ONDANSETRON HCL 4 MG/2ML IJ SOLN: 4.0000 mg | Freq: Four times a day (QID) | INTRAMUSCULAR | Status: DC | PRN

## 2014-12-26 MED ORDER — HEPARIN SOD (PORK) LOCK FLUSH 100 UNIT/ML IV SOLN
500.0000 [IU] | INTRAVENOUS | Status: AC | PRN
  Administered 2014-12-26: 500 [IU]
  Filled 2014-12-26: qty 5

## 2014-12-26 MED ORDER — OXYCODONE HCL 5 MG PO TABS
10.0000 mg | ORAL_TABLET | Freq: Once | ORAL | Status: AC
  Administered 2014-12-26: 10 mg via ORAL
  Filled 2014-12-26: qty 2

## 2014-12-26 MED ORDER — KCL IN DEXTROSE-NACL 20-5-0.45 MEQ/L-%-% IV SOLN
INTRAVENOUS | Status: DC
  Administered 2014-12-26: 13:00:00 via INTRAVENOUS
  Filled 2014-12-26 (×2): qty 1000

## 2014-12-26 MED ORDER — KETOROLAC TROMETHAMINE 30 MG/ML IJ SOLN
30.0000 mg | Freq: Once | INTRAMUSCULAR | Status: AC
  Administered 2014-12-26: 30 mg via INTRAVENOUS
  Filled 2014-12-26: qty 1

## 2014-12-26 MED ORDER — NALOXONE HCL 0.4 MG/ML IJ SOLN: 0.4000 mg | INTRAMUSCULAR | Status: DC | PRN

## 2014-12-26 MED ORDER — POTASSIUM CHLORIDE CRYS ER 20 MEQ PO TBCR
40.0000 meq | EXTENDED_RELEASE_TABLET | Freq: Two times a day (BID) | ORAL | Status: DC
  Administered 2014-12-26: 40 meq via ORAL
  Filled 2014-12-26: qty 2

## 2014-12-26 MED ORDER — SODIUM CHLORIDE 0.9 % IJ SOLN: 9.0000 mL | INTRAMUSCULAR | Status: DC | PRN

## 2014-12-26 MED ORDER — DIPHENHYDRAMINE HCL 12.5 MG/5ML PO ELIX: 12.5000 mg | ORAL_SOLUTION | Freq: Four times a day (QID) | ORAL | Status: DC | PRN

## 2014-12-26 NOTE — Progress Notes (Signed)
CRITICAL VALUE ALERT  Critical value received: K+ 2.6  Date of notification: 12/26/2014  Time of notification: 1225  Critical value read back: yes  Nurse who received alert:  Merlene Morseeborah Zacary Bauer RN  MD notified (1st page):  Hart RochesterHollis  Time of first page:  1229  MD notified (2nd page):  Time of second page:  Responding MD: Hart RochesterHollis  Time MD responded:  1229

## 2014-12-26 NOTE — H&P (Signed)
[facility] History and Physical   Date: 12/26/2014  Patient name: Brittany Foster Medical record number: [id number] Date of birth: 23-Nov-1985 Age: 29 y.o. Gender: female PCP: [name],[name], MD  Attending physician: Quentin Angst, MD  Chief Complaint: Generalized pain  History of Present Illness: Brittany Foster, a 29 year old female with a history of sickle cell anemia, HbSS presents with generalized pain. She states that pain is primarily to left ribs and lower extremities. She attributes current pain intensity to changes in weather. Current pain intensity is 7/10 described as constant and aching. She reports that she last had pain medication around 7 am with minimal relief. She maintains that she is taking all other medications consistently. Patient denies fatigue, headache, shortness of breath, urinary problems, nausea, vomiting, or diarrhea.   Meds: Prescriptions prior to admission  Medication Sig Dispense Refill Last Dose  . Buprenorphine HCl (BELBUCA) 75 MCG FILM Place inside cheek 2 (two) times daily.   12/26/2014 at Unknown time  . DULoxetine (CYMBALTA) 60 MG capsule Take 60 mg by mouth.   12/26/2014 at Unknown time  . folic acid (FOLVITE) 1 MG tablet Take 1 tablet (1 mg total) by mouth daily. 30 tablet 11 12/26/2014 at Unknown time  . HYDROcodone-acetaminophen (NORCO) 10-325 MG per tablet Take 1 tablet by mouth 3 (three) times daily as needed.   12/26/2014 at Unknown time  . hydroxyurea (HYDREA) 500 MG capsule Take 2 capsules (1,000 mg total) by mouth daily. May take with food to minimize GI side effects. 60 capsule 3 12/26/2014 at Unknown time  . oxyCODONE-acetaminophen (PERCOCET) 10-325 MG per tablet Take 1 tablet by mouth every 6 (six) hours as needed for pain. 100 tablet 0 12/26/2014 at Unknown time  . Topiramate ER 100 MG CP24 Take 100 mg by mouth at bedtime.   Past Week at Unknown time  . Cholecalciferol (VITAMIN D3) 5000 UNITS CAPS Take 1 capsule  (5,000 Units total) by mouth daily. 30 capsule 5 Unknown at Unknown time  . fluconazole (DIFLUCAN) 200 MG tablet Take 1 tablet (200 mg total) by mouth once. 2 tablet 0 Unknown at Unknown time  . gabapentin (NEURONTIN) 300 MG capsule Take 1 capsule (300 mg total) by mouth 3 (three) times daily. 90 capsule 2 Unknown at Unknown time  . Naproxen Sodium (ALEVE) 220 MG CAPS Take 660 mg by mouth every 6 (six) hours as needed (pain).   Unknown at Unknown time  . potassium chloride SA (K-DUR,KLOR-CON) 20 MEQ tablet Take 1 tablet (20 mEq total) by mouth daily. 30 tablet 1 Unknown at Unknown time    Allergies: Ultram and Morphine and related Past Medical History  Diagnosis Date  . Sickle cell anemia [facility])    Past Surgical History  Procedure Laterality Date  . Tubal ligation    . Cholecystectomy    . Cesarean section    . Port a cath placement Right     about 6-7 years ago  . Cholecystectomy  2000   Family History  Problem Relation Age of Onset  . Sickle cell anemia Other   . Sickle cell trait Father   . Sickle cell trait Mother    Social History   Social History  . Marital Status: Single    Spouse Name: N/A  . Number of Children: N/A  . Years of Education: N/A   Occupational History  . Not on file.   Social History Main Topics  . Smoking status: Former Games developer  . Smokeless  tobacco: Not on file     Comment: smokes once every couple of weeks.   . Alcohol Use: No  . Drug Use: No  . Sexual Activity: Yes    Birth Control/ Protection: Surgical   Other Topics Concern  . Not on file   Social History Narrative    Review of Systems: Constitutional: negative for fatigue and fevers Eyes: negative Ears, nose, mouth, throat, and face: negative Respiratory: negative for cough, dyspnea on exertion and wheezing Cardiovascular: negative for chest pain, fatigue, orthopnea and tachypnea Gastrointestinal: negative for constipation, diarrhea and  nausea Genitourinary:negative Integument/breast: negative Hematologic/lymphatic: negative Musculoskeletal:negative for myalgias Neurological: negative Behavioral/Psych: negative Endocrine: negative Allergic/Immunologic: negative  Physical Exam: Blood pressure 111/69, pulse 75, temperature 98 F (36.7 C), temperature source Oral, resp. rate 18, height 5' (1.524 m), weight 112 lb (50.803 kg), last menstrual period 12/21/2014, SpO2 100 %. BP 111/69 mmHg  Pulse 75  Temp(Src) 98 F (36.7 C) (Oral)  Resp 18  Ht 5' (1.524 m)  Wt 112 lb (50.803 kg)  BMI 21.87 kg/m2  SpO2 100%  PF   LMP 12/21/2014  General Appearance:    Alert, cooperative, mild distress, appears stated age  Head:    Normocephalic, without obvious abnormality, atraumatic  Eyes:    PERRL, conjunctiva/corneas clear, EOM's intact, fundi    benign, both eyes  Ears:    Normal TM's and external ear canals, both ears  Nose:   Nares normal, septum midline, mucosa normal, no drainage    or sinus tenderness  Throat:   Lips, mucosa, and tongue normal; teeth and gums normal  Neck:   Supple, symmetrical, trachea midline, no adenopathy;    thyroid:  no enlargement/tenderness/nodules; no carotid   bruit or JVD  Back:     Symmetric, no curvature, ROM normal, no CVA tenderness  Lungs:     Clear to auscultation bilaterally, respirations unlabored  Chest Wall:    No tenderness or deformity   Heart:    Regular rate and rhythm, S1 and S2 normal, no murmur, rub   or gallop  Abdomen:     Soft, non-tender, bowel sounds active all four quadrants,    no masses, no organomegaly  Extremities:   Extremities normal, atraumatic, no cyanosis or edema  Pulses:   2+ and symmetric all extremities  Skin:   Skin color, texture, turgor normal, no rashes or lesions  Lymph nodes:   Cervical, supraclavicular, and axillary nodes normal  Neurologic:   CNII-XII intact, normal strength, sensation and reflexes    throughout    Lab results: No results  found for this or any previous visit (from the past 24 hour(s)).  Imaging results:  No results found.   Assessment & Plan:  Patient will be admitted to the day infusion center for extended observation  Start IV D5.45 for cellular rehydration at 125/hr  Start Toradol 30 mg IV every 6 hours for inflammation.  Start Dilaudid PCA High Concentration per weight based protocol.   Patient will be re-evaluated for pain intensity in the context of function and relationship to baseline as care progresses.  If no significant pain relief, will transfer patient to inpatient services for a higher level of care.   Will check CMP,  LDH and CBC w/differential  [name],Lachina M 12/26/2014, 10:42 AM

## 2014-12-26 NOTE — Telephone Encounter (Signed)
Patient C/O pain to legs and back that is 7/10 on pain scale.  Patient states she has taken her long acting and short acting pain medication.  Patient denies chest pain, N/V/D, abdominal pain or diarrhea.  Placed caller on hold and discussed with Armeniahina [name], NP and it is [facility].  Patient verbalizes understanding.

## 2014-12-26 NOTE — Progress Notes (Signed)
Patient treated in day hospital today. Rates pain 6/10 at discharge. Patient alert, oriented, and ambulatory. Port accessed and de accessed per protocol. Discharge instructions given also noted MD ordered PO potassium for her to pick up from her pharmacy. Patient verbalizes understanding. Patient verbalizes no other concerns at this time.

## 2014-12-26 NOTE — Discharge Summary (Signed)
[facility] Discharge Summary   Patient ID: Brittany Foster MRN: [id number] DOB/AGE: 1985/03/18 29 y.o.  Admit date: 12/26/2014 Discharge date: 12/26/2014  Primary Care Physician:  [name],[name], MD  Admission Diagnoses:  Active Problems:   Sickle cell anemia with pain [facility])  Discharge Medications:    Medication List    ASK your doctor about these medications        ALEVE 220 MG Caps  Generic drug:  Naproxen Sodium  Take 660 mg by mouth every 6 (six) hours as needed (pain).     BELBUCA 75 MCG Film  Generic drug:  Buprenorphine HCl  Place inside cheek 2 (two) times daily.     DULoxetine 60 MG capsule  Commonly known as:  CYMBALTA  Take 60 mg by mouth.     fluconazole 200 MG tablet  Commonly known as:  DIFLUCAN  Take 1 tablet (200 mg total) by mouth once.     folic acid 1 MG tablet  Commonly known as:  FOLVITE  Take 1 tablet (1 mg total) by mouth daily.     gabapentin 300 MG capsule  Commonly known as:  NEURONTIN  Take 1 capsule (300 mg total) by mouth 3 (three) times daily.     HYDROcodone-acetaminophen 10-325 MG tablet  Commonly known as:  NORCO  Take 1 tablet by mouth 3 (three) times daily as needed.     hydroxyurea 500 MG capsule  Commonly known as:  HYDREA  Take 2 capsules (1,000 mg total) by mouth daily. May take with food to minimize GI side effects.     oxyCODONE-acetaminophen 10-325 MG tablet  Commonly known as:  PERCOCET  Take 1 tablet by mouth every 6 (six) hours as needed for pain.     potassium chloride SA 20 MEQ tablet  Commonly known as:  K-DUR,KLOR-CON  Take 1 tablet (20 mEq total) by mouth daily.     Topiramate ER 100 MG Cp24  Take 100 mg by mouth at bedtime.     Vitamin D3 5000 UNITS Caps  Take 1 capsule (5,000 Units total) by mouth daily.         Consults:  None  Significant Diagnostic Studies:  No results found.   [facility] Course: -Brittany Foster was admitted to the day  infusion center for extended observation. Patient was started on a high concentration dilaudid PCA.   -Reviewed labs, potassium is 2.6, gave 40 mEq of Kdur, and added potassium to fluids.Potassium 3.2 prior to discharge. Will continue Kdur, 20 mEq daily.   -She used a total of 15.5 mg with 33 demands and 31 deliveries.   -Patient was given  Oxycodone 10 mg 30 minutes prior to discharge. Patient was reminded to schedule a follow up appointment with Dr. Hyman Hopes.    -She is alert, oriented, and ambulatory. Patient encouraged to hydrate and take medications consistently.   Physical Exam at Discharge: BP 102/50 mmHg  Pulse 74  Temp(Src) 98.3 F (36.8 C) (Oral)  Resp 10  Ht 5' (1.524 m)  Wt 112 lb (50.803 kg)  BMI 21.87 kg/m2  SpO2 99%  PF   LMP 12/21/2014  General Appearance:    Alert, cooperative, no distress, appears stated age  Head:    Normocephalic, without obvious abnormality, atraumatic  Eyes:    PERRL, conjunctiva/corneas clear, EOM's intact, fundi    benign, both eyes  Back:     Symmetric, no curvature, ROM normal, no CVA tenderness  Lungs:  Clear to auscultation bilaterally, respirations unlabored  Chest Wall:    No tenderness or deformity   Heart:    Regular rate and rhythm, S1 and S2 normal, no murmur, rub   or gallop  Abdomen:     Soft, non-tender, bowel sounds active all four quadrants,    no masses, no organomegaly     Extremities:   Extremities normal, atraumatic, no cyanosis or edema  Pulses:   2+ and symmetric all extremities  Skin:   Skin color, texture, turgor normal, no rashes or lesions  Lymph nodes:   Cervical, supraclavicular, and axillary nodes normal  Neurologic:   CNII-XII intact, normal strength, sensation and reflexes    throughout   Disposition at Discharge: 01-Home or Self Care  Discharge Orders:   Condition at Discharge:   Stable  Time spent on Discharge:  15 minutes  Signed: [name],Lachina M 12/26/2014, 4:40 PM

## 2014-12-31 ENCOUNTER — Telehealth (HOSPITAL_COMMUNITY): Payer: Self-pay | Admitting: *Deleted

## 2014-12-31 NOTE — Telephone Encounter (Signed)
Pt called day hospital and wanting to come for treatment. Stated that her pain is 8/10 and that she took her last pain med at 4am this morning.  She was told that we would check with the provider and call her back. Pt voiced understanding.

## 2014-12-31 NOTE — Telephone Encounter (Signed)
Pt was called and told that she needed to continue to take her pain med as prescribe and hydrate. Pt voiced understanding.

## 2015-01-01 ENCOUNTER — Telehealth (HOSPITAL_COMMUNITY): Payer: Self-pay | Admitting: *Deleted

## 2015-01-01 ENCOUNTER — Telehealth (HOSPITAL_COMMUNITY): Payer: Self-pay | Admitting: Internal Medicine

## 2015-01-01 ENCOUNTER — Telehealth: Payer: Self-pay | Admitting: Clinical

## 2015-01-01 NOTE — Telephone Encounter (Signed)
Spoke with [name] informing her that, per NP, she should increase hydration and take home pain medications as prescribed; pt states that she called the pain clinic to speak with the provider; notified patient that if experiencing no relief by tomorrow to call clinic tomorrow morning; pt verbalizes understanding

## 2015-01-01 NOTE — Telephone Encounter (Signed)
Pt called with complaints of continuous pain all over her body. Pt states she took all her medicines with no relief. Pt told that we will call her back as soon as I can notify the provider. Tabor Denham, [name],RN

## 2015-01-01 NOTE — Telephone Encounter (Signed)
[facility]; pt may schedule appointments with Asher MuirJamie at [phone].

## 2015-01-02 ENCOUNTER — Telehealth (HOSPITAL_COMMUNITY): Payer: Self-pay | Admitting: Internal Medicine

## 2015-01-02 NOTE — Telephone Encounter (Signed)
Notified pt that, per NP, she should take home pain medications as prescribed, rest, hydrate and if symptoms worsen, go to local ED for evaluation; pt verbalizes understanding

## 2015-01-02 NOTE — Telephone Encounter (Signed)
Pt called and states experiencing pain in ribs, legs, and back; states called pain clinic yesterday and was told to increase dose of home medications but still experiencing no relief; pt denies CP, SOB, fever, nausea, vomiting, or diarrhea; NP notified

## 2015-01-03 ENCOUNTER — Telehealth (HOSPITAL_COMMUNITY): Payer: Self-pay | Admitting: Internal Medicine

## 2015-01-03 ENCOUNTER — Encounter (HOSPITAL_COMMUNITY): Payer: Self-pay | Admitting: *Deleted

## 2015-01-03 ENCOUNTER — Non-Acute Institutional Stay (HOSPITAL_COMMUNITY)
Admission: AD | Admit: 2015-01-03 | Discharge: 2015-01-03 | Disposition: A | Discharge status: Home / Self Care | Payer: Medicaid Other | Attending: Internal Medicine | Admitting: Internal Medicine

## 2015-01-03 DIAGNOSIS — D5700 Hb-SS disease with crisis, unspecified: Secondary | ICD-10-CM | POA: Diagnosis present

## 2015-01-03 DIAGNOSIS — Z79891 Long term (current) use of opiate analgesic: Secondary | ICD-10-CM | POA: Insufficient documentation

## 2015-01-03 DIAGNOSIS — D57819 Other sickle-cell disorders with crisis, unspecified: Secondary | ICD-10-CM

## 2015-01-03 DIAGNOSIS — Z87891 Personal history of nicotine dependence: Secondary | ICD-10-CM | POA: Diagnosis not present

## 2015-01-03 DIAGNOSIS — Z791 Long term (current) use of non-steroidal anti-inflammatories (NSAID): Secondary | ICD-10-CM | POA: Diagnosis not present

## 2015-01-03 DIAGNOSIS — R52 Pain, unspecified: Secondary | ICD-10-CM | POA: Diagnosis present

## 2015-01-03 DIAGNOSIS — Z79899 Other long term (current) drug therapy: Secondary | ICD-10-CM | POA: Diagnosis not present

## 2015-01-03 LAB — COMPREHENSIVE METABOLIC PANEL
ALT: 31 U/L (ref 14–54)
ANION GAP: 6 (ref 5–15)
AST: 46 U/L — ABNORMAL HIGH (ref 15–41)
Albumin: 3.8 g/dL (ref 3.5–5.0)
Alkaline Phosphatase: 81 U/L (ref 38–126)
BILIRUBIN TOTAL: 2 mg/dL — AB (ref 0.3–1.2)
BUN: 9 mg/dL (ref 6–20)
CHLORIDE: 110 mmol/L (ref 101–111)
CO2: 24 mmol/L (ref 22–32)
Calcium: 8.9 mg/dL (ref 8.9–10.3)
Creatinine, Ser: 0.35 mg/dL — ABNORMAL LOW (ref 0.44–1.00)
Glucose, Bld: 120 mg/dL — ABNORMAL HIGH (ref 65–99)
POTASSIUM: 3.2 mmol/L — AB (ref 3.5–5.1)
Sodium: 140 mmol/L (ref 135–145)
TOTAL PROTEIN: 7 g/dL (ref 6.5–8.1)

## 2015-01-03 MED ORDER — SODIUM CHLORIDE 0.9 % IJ SOLN: 9.0000 mL | INTRAMUSCULAR | Status: DC | PRN

## 2015-01-03 MED ORDER — DEXTROSE-NACL 5-0.45 % IV SOLN
INTRAVENOUS | Status: DC
  Administered 2015-01-03: 11:00:00 via INTRAVENOUS

## 2015-01-03 MED ORDER — ONDANSETRON HCL 4 MG/2ML IJ SOLN
4.0000 mg | Freq: Four times a day (QID) | INTRAMUSCULAR | Status: DC | PRN
  Filled 2015-01-03: qty 2

## 2015-01-03 MED ORDER — SODIUM CHLORIDE 0.9 % IV SOLN
12.5000 mg | Freq: Four times a day (QID) | INTRAVENOUS | Status: DC | PRN
  Administered 2015-01-03: 12.5 mg via INTRAVENOUS
  Filled 2015-01-03: qty 0.25

## 2015-01-03 MED ORDER — SODIUM CHLORIDE 0.9 % IJ SOLN: 10.0000 mL | INTRAMUSCULAR | Status: DC | PRN

## 2015-01-03 MED ORDER — HYDROMORPHONE 1 MG/ML IV SOLN
INTRAVENOUS | Status: DC
  Administered 2015-01-03: 11:00:00 via INTRAVENOUS
  Filled 2015-01-03: qty 25

## 2015-01-03 MED ORDER — KETOROLAC TROMETHAMINE 30 MG/ML IJ SOLN
30.0000 mg | Freq: Once | INTRAMUSCULAR | Status: AC
  Administered 2015-01-03: 30 mg via INTRAVENOUS
  Filled 2015-01-03: qty 1

## 2015-01-03 MED ORDER — DIPHENHYDRAMINE HCL 12.5 MG/5ML PO ELIX
12.5000 mg | ORAL_SOLUTION | Freq: Four times a day (QID) | ORAL | Status: DC | PRN
  Filled 2015-01-03: qty 5

## 2015-01-03 MED ORDER — OXYCODONE HCL 5 MG PO TABS
10.0000 mg | ORAL_TABLET | Freq: Once | ORAL | Status: AC
  Administered 2015-01-03: 10 mg via ORAL
  Filled 2015-01-03: qty 2

## 2015-01-03 MED ORDER — NALOXONE HCL 0.4 MG/ML IJ SOLN: 0.4000 mg | INTRAMUSCULAR | Status: DC | PRN

## 2015-01-03 MED ORDER — HEPARIN SOD (PORK) LOCK FLUSH 100 UNIT/ML IV SOLN
500.0000 [IU] | INTRAVENOUS | Status: DC | PRN
  Filled 2015-01-03: qty 5

## 2015-01-03 NOTE — H&P (Signed)
[facility] History and Physical   Date: 01/03/2015  Patient name: Brittany Foster Medical record number: [id number] Date of birth: [birth year] Age: 29 y.o. Gender: female PCP: [name],[name], MD  Attending physician: Brittany Angst, MD  Chief Complaint: Generalized pain  History of Present Illness: Brittany Foster, a 29 year old female with a history of sickle cell anemia, HbSS presents with generalized pain. She states that pain is primarily to left ribs and lower extremities. Patient maintains that she followed up with pain management, who increased Hydrocodone-acetaminophen 10-325 to every 6 hours. Current pain intensity is 8/10 described as constant and aching. She reports that she last had pain medication around 7 am with minimal relief. She maintains that she is taking all other medications consistently. Patient denies fatigue, headache, shortness of breath, urinary problems, nausea, vomiting, or diarrhea.   Meds: Prescriptions prior to admission  Medication Sig Dispense Refill Last Dose  . Buprenorphine HCl (BELBUCA) 75 MCG FILM Place inside cheek 2 (two) times daily.   12/26/2014 at Unknown time  . Cholecalciferol (VITAMIN D3) 5000 UNITS CAPS Take 1 capsule (5,000 Units total) by mouth daily. 30 capsule 5 Unknown at Unknown time  . DULoxetine (CYMBALTA) 60 MG capsule Take 60 mg by mouth.   12/26/2014 at Unknown time  . fluconazole (DIFLUCAN) 200 MG tablet Take 1 tablet (200 mg total) by mouth once. 2 tablet 0 Unknown at Unknown time  . folic acid (FOLVITE) 1 MG tablet Take 1 tablet (1 mg total) by mouth daily. 30 tablet 11 12/26/2014 at Unknown time  . gabapentin (NEURONTIN) 300 MG capsule Take 1 capsule (300 mg total) by mouth 3 (three) times daily. 90 capsule 2 Unknown at Unknown time  . HYDROcodone-acetaminophen (NORCO) 10-325 MG per tablet Take 1 tablet by mouth 3 (three) times daily as needed.   12/26/2014 at Unknown time  . hydroxyurea (HYDREA) 500 MG  capsule Take 2 capsules (1,000 mg total) by mouth daily. May take with food to minimize GI side effects. 60 capsule 3 12/26/2014 at Unknown time  . Naproxen Sodium (ALEVE) 220 MG CAPS Take 660 mg by mouth every 6 (six) hours as needed (pain).   Unknown at Unknown time  . oxyCODONE-acetaminophen (PERCOCET) 10-325 MG per tablet Take 1 tablet by mouth every 6 (six) hours as needed for pain. 100 tablet 0 12/26/2014 at Unknown time  . potassium chloride SA (K-DUR,KLOR-CON) 20 MEQ tablet Take 1 tablet (20 mEq total) by mouth daily. 30 tablet 2   . Topiramate ER 100 MG CP24 Take 100 mg by mouth at bedtime.   Past Week at Unknown time    Allergies: Ultram and Morphine and related Past Medical History  Diagnosis Date  . Sickle cell anemia [facility])    Past Surgical History  Procedure Laterality Date  . Tubal ligation    . Cholecystectomy    . Cesarean section    . Port a cath placement Right     about 6-7 years ago  . Cholecystectomy  2000   Family History  Problem Relation Age of Onset  . Sickle cell anemia Other   . Sickle cell trait Father   . Sickle cell trait Mother    Social History   Social History  . Marital Status: Single    Spouse Name: N/A  . Number of Children: N/A  . Years of Education: N/A   Occupational History  . Not on file.   Social History Main Topics  . Smoking  status: Former Games developermoker  . Smokeless tobacco: Not on file     Comment: smokes once every couple of weeks.   . Alcohol Use: No  . Drug Use: No  . Sexual Activity: Yes    Birth Control/ Protection: Surgical   Other Topics Concern  . Not on file   Social History Narrative    Review of Systems: Constitutional: negative for fatigue and fevers Eyes: negative Ears, nose, mouth, throat, and face: negative Respiratory: negative for cough, dyspnea on exertion and wheezing Cardiovascular: negative for chest pain, fatigue, orthopnea and tachypnea Gastrointestinal: negative for constipation, diarrhea and  nausea Genitourinary:negative Integument/breast: negative Hematologic/lymphatic: negative Musculoskeletal:negative for myalgias Neurological: negative Behavioral/Psych: negative Endocrine: negative Allergic/Immunologic: negative  Physical Exam: Last menstrual period 12/21/2014. LMP 12/21/2014  General Appearance:    Alert, cooperative, mild distress, appears stated age  Head:    Normocephalic, without obvious abnormality, atraumatic  Eyes:    PERRL, conjunctiva/corneas clear, EOM's intact, fundi    benign, both eyes  Ears:    Normal TM's and external ear canals, both ears  Nose:   Nares normal, septum midline, mucosa normal, no drainage    or sinus tenderness  Throat:   Lips, mucosa, and tongue normal; teeth and gums normal  Neck:   Supple, symmetrical, trachea midline, no adenopathy;    thyroid:  no enlargement/tenderness/nodules; no carotid   bruit or JVD  Back:     Symmetric, no curvature, ROM normal, no CVA tenderness  Lungs:     Clear to auscultation bilaterally, respirations unlabored  Chest Wall:    No tenderness or deformity   Heart:    Regular rate and rhythm, S1 and S2 normal, no murmur, rub   or gallop  Abdomen:     Soft, non-tender, bowel sounds active all four quadrants,    no masses, no organomegaly  Extremities:   Extremities normal, atraumatic, no cyanosis or edema  Pulses:   2+ and symmetric all extremities  Skin:   Skin color, texture, turgor normal, no rashes or lesions  Lymph nodes:   Cervical, supraclavicular, and axillary nodes normal  Neurologic:   CNII-XII intact, normal strength, sensation and reflexes    throughout    Lab results: No results found for this or any previous visit (from the past 24 hour(s)).  Imaging results:  No results found.   Assessment & Plan:  Patient will be admitted to the day infusion center for extended observation  Start IV D5.45 for cellular rehydration at 125/hr  Start Toradol 30 mg IV every 6 hours for  inflammation.  Start Dilaudid PCA High Concentration per weight based protocol.   Patient will be re-evaluated for pain intensity in the context of function and relationship to baseline as care progresses.  If no significant pain relief, will transfer patient to inpatient services for a higher level of care.   Will check CMP,  Reticulocytes and CBC w/differential  [name],Lachina M 01/03/2015, 10:11 AM

## 2015-01-03 NOTE — Telephone Encounter (Signed)
Pt called and states experiencing pain in ribs, legs, back; states taken home pain medications with no relief; denies SOB, CP, nausea, vomiting, and diarrhea; NP notified; pt notified that she may come to day hospital for evaluation

## 2015-01-03 NOTE — Discharge Summary (Signed)
[facility] Discharge Summary   Patient ID: Brittany Foster MRN: [id number] DOB/AGE: June 04, 1985 29 y.o.  Admit date: 01/03/2015 Discharge date: 01/03/2015  Primary Care Physician:  [name],[name], MD  Admission Diagnoses:  Active Problems:   Sickle-cell disease with pain (HCC)  Discharge Medications:    Medication List    TAKE these medications        ALEVE 220 MG Caps  Generic drug:  Naproxen Sodium  Take 660 mg by mouth every 6 (six) hours as needed (pain).     BELBUCA 75 MCG Film  Generic drug:  Buprenorphine HCl  Place inside cheek 2 (two) times daily.     DULoxetine 60 MG capsule  Commonly known as:  CYMBALTA  Take 60 mg by mouth.     fluconazole 200 MG tablet  Commonly known as:  DIFLUCAN  Take 1 tablet (200 mg total) by mouth once.     folic acid 1 MG tablet  Commonly known as:  FOLVITE  Take 1 tablet (1 mg total) by mouth daily.     gabapentin 300 MG capsule  Commonly known as:  NEURONTIN  Take 1 capsule (300 mg total) by mouth 3 (three) times daily.     HYDROcodone-acetaminophen 10-325 MG tablet  Commonly known as:  NORCO  Take 1 tablet by mouth 3 (three) times daily as needed.     hydroxyurea 500 MG capsule  Commonly known as:  HYDREA  Take 2 capsules (1,000 mg total) by mouth daily. May take with food to minimize GI side effects.     oxyCODONE-acetaminophen 10-325 MG tablet  Commonly known as:  PERCOCET  Take 1 tablet by mouth every 6 (six) hours as needed for pain.     potassium chloride SA 20 MEQ tablet  Commonly known as:  K-DUR,KLOR-CON  Take 1 tablet (20 mEq total) by mouth daily.     Topiramate ER 100 MG Cp24  Take 100 mg by mouth at bedtime.     Vitamin D3 5000 UNITS Caps  Take 1 capsule (5,000 Units total) by mouth daily.         Consults:  None  Significant Diagnostic Studies:  No results found.   [facility] Course: -[name] was admitted to the day infusion center for  extended observation. Patient was started on a high concentration dilaudid PCA. She used a total of 17 mg with 35 demands and 34 deliveries. She reports that pain intensity is 6/10.   -Reviewed labs, potassium is 3.2; recommend that patient continues home potassium.  -Patient was given  Oxycodone 10 mg 30 minutes prior to discharge. Patient was reminded to schedule a follow up appointment with Dr. Hyman Hopes.  Patient was very argumentative concerning follow up appointments. She has been discharged from pain clinic and states that it is my fault for not allowing her to come to the day infusion center daily. She states that her pill count was not correct at pain management because I asked her to follow up with pain management on several occasions for chronic pain. Patient was reminded that we treat acute pain crisis in the day infusion center and she wass to follow up with [facility] management for chronic pain.  When patient calls the day hospital there are typically not any changes expressed that warrant treatment in the day infusion center with high concentration dilaudid. Patient typically calls the day infusion center daily for treatment, which is a misuse of resource.  Patient is told to take pain medications as prescribed, hydrate, and contact us if pain intensity increases.   She has an appointment scheduled on 01/08/2015 with Dr. Hyman Foster to discuss uncontrolled chronic pain.   -She is alert, oriented, and ambulatory.   Physical Exam at Discharge: BP 101/60 mmHg  Pulse 80  Temp(Src) 97.4 F (36.3 C) (Oral)  Resp 12  Ht 5' (1.524 m)  Wt 112 lb (50.803 kg)  BMI 21.87 kg/m2  SpO2 100%  LMP 12/21/2014  General Appearance:    Alert, cooperative, no distress, appears stated age  Head:    Normocephalic, without obvious abnormality, atraumatic  Eyes:    PERRL, conjunctiva/corneas clear, EOM's intact, fundi    benign, both eyes  Back:     Symmetric, no curvature, ROM normal, no CVA tenderness   Lungs:     Clear to auscultation bilaterally, respirations unlabored  Chest Wall:    No tenderness or deformity   Heart:    Regular rate and rhythm, S1 and S2 normal, no murmur, rub   or gallop  Abdomen:     Soft, non-tender, bowel sounds active all four quadrants,    no masses, no organomegaly     Extremities:   Extremities normal, atraumatic, no cyanosis or edema  Pulses:   2+ and symmetric all extremities  Skin:   Skin color, texture, turgor normal, no rashes or lesions  Lymph nodes:   Cervical, supraclavicular, and axillary nodes normal  Neurologic:   CNII-XII intact, normal strength, sensation and reflexes    throughout   Disposition at Discharge: 01-Home or Self Care  Discharge Orders: Discharge Instructions    Discharge patient    Complete by:  As directed            Condition at Discharge:   Stable  Time spent on Discharge:  15 minutes  Signed: Eman Morimoto M 01/03/2015, 5:15 PM

## 2015-01-03 NOTE — Progress Notes (Signed)
Pt discharged home a&ox4 and ambulatory. Pt received all belongings, f/up appt card, and verbalized understanding of discharge instructions prior to discharge. Rt chest port flushed and de-accessed prior to discharge, dry dressing applied. Pt denied any further needs at time of departure. Pt ambulated to front lobby with steady gait.

## 2015-01-08 ENCOUNTER — Encounter (HOSPITAL_BASED_OUTPATIENT_CLINIC_OR_DEPARTMENT_OTHER): Payer: Medicaid Other | Admitting: Clinical

## 2015-01-08 ENCOUNTER — Ambulatory Visit (INDEPENDENT_AMBULATORY_CARE_PROVIDER_SITE_OTHER): Payer: Medicaid Other | Admitting: Internal Medicine

## 2015-01-08 ENCOUNTER — Non-Acute Institutional Stay (HOSPITAL_COMMUNITY)
Admission: AD | Admit: 2015-01-08 | Discharge: 2015-01-08 | Disposition: A | Discharge status: Home / Self Care | Payer: Medicaid Other | Source: Ambulatory Visit | Attending: Internal Medicine | Admitting: Internal Medicine

## 2015-01-08 ENCOUNTER — Encounter (HOSPITAL_COMMUNITY): Payer: Self-pay | Admitting: *Deleted

## 2015-01-08 ENCOUNTER — Encounter: Payer: Self-pay | Admitting: Internal Medicine

## 2015-01-08 VITALS — BP 114/78 | HR 101 | Temp 98.7°F | Resp 20 | Ht 60.0 in | Wt 123.0 lb

## 2015-01-08 DIAGNOSIS — E559 Vitamin D deficiency, unspecified: Secondary | ICD-10-CM | POA: Insufficient documentation

## 2015-01-08 DIAGNOSIS — D5700 Hb-SS disease with crisis, unspecified: Secondary | ICD-10-CM | POA: Diagnosis not present

## 2015-01-08 DIAGNOSIS — R52 Pain, unspecified: Secondary | ICD-10-CM | POA: Diagnosis present

## 2015-01-08 DIAGNOSIS — D571 Sickle-cell disease without crisis: Secondary | ICD-10-CM

## 2015-01-08 DIAGNOSIS — E876 Hypokalemia: Secondary | ICD-10-CM | POA: Diagnosis not present

## 2015-01-08 DIAGNOSIS — Z79899 Other long term (current) drug therapy: Secondary | ICD-10-CM | POA: Diagnosis not present

## 2015-01-08 DIAGNOSIS — D57819 Other sickle-cell disorders with crisis, unspecified: Secondary | ICD-10-CM | POA: Diagnosis not present

## 2015-01-08 DIAGNOSIS — F329 Major depressive disorder, single episode, unspecified: Secondary | ICD-10-CM

## 2015-01-08 DIAGNOSIS — G894 Chronic pain syndrome: Secondary | ICD-10-CM | POA: Diagnosis not present

## 2015-01-08 DIAGNOSIS — F32A Depression, unspecified: Secondary | ICD-10-CM

## 2015-01-08 MED ORDER — HYDROMORPHONE 1 MG/ML IV SOLN
INTRAVENOUS | Status: DC
  Administered 2015-01-08: 13:00:00 via INTRAVENOUS
  Administered 2015-01-08: 11.5 mg via INTRAVENOUS
  Filled 2015-01-08: qty 25

## 2015-01-08 MED ORDER — SODIUM CHLORIDE 0.9 % IV SOLN
25.0000 mg | INTRAVENOUS | Status: DC | PRN
  Administered 2015-01-08: 25 mg via INTRAVENOUS
  Filled 2015-01-08 (×3): qty 0.5

## 2015-01-08 MED ORDER — HEPARIN SOD (PORK) LOCK FLUSH 100 UNIT/ML IV SOLN
500.0000 [IU] | INTRAVENOUS | Status: AC | PRN
  Administered 2015-01-08: 500 [IU]
  Filled 2015-01-08: qty 5

## 2015-01-08 MED ORDER — OXYCODONE-ACETAMINOPHEN 10-325 MG PO TABS: 1.0000 | ORAL_TABLET | Freq: Three times a day (TID) | ORAL | Status: DC | PRN

## 2015-01-08 MED ORDER — OXYCODONE-ACETAMINOPHEN 10-325 MG PO TABS
1.0000 | ORAL_TABLET | Freq: Three times a day (TID) | ORAL | Status: DC | PRN
Start: 2015-01-08 — End: 2015-01-09

## 2015-01-08 MED ORDER — SODIUM CHLORIDE 0.9 % IJ SOLN: 9.0000 mL | INTRAMUSCULAR | Status: DC | PRN

## 2015-01-08 MED ORDER — DEXTROSE-NACL 5-0.45 % IV SOLN
INTRAVENOUS | Status: DC
  Administered 2015-01-08: 13:00:00 via INTRAVENOUS

## 2015-01-08 MED ORDER — KETOROLAC TROMETHAMINE 30 MG/ML IJ SOLN
30.0000 mg | Freq: Four times a day (QID) | INTRAMUSCULAR | Status: DC
  Administered 2015-01-08: 30 mg via INTRAVENOUS
  Filled 2015-01-08: qty 1

## 2015-01-08 MED ORDER — NALOXONE HCL 0.4 MG/ML IJ SOLN
0.4000 mg | INTRAMUSCULAR | Status: DC | PRN
Start: 2015-01-08 — End: 2015-01-08

## 2015-01-08 MED ORDER — OXYCODONE-ACETAMINOPHEN 10-325 MG PO TABS: 1.0000 | ORAL_TABLET | Freq: Four times a day (QID) | ORAL | Status: DC | PRN

## 2015-01-08 MED ORDER — POTASSIUM CHLORIDE CRYS ER 20 MEQ PO TBCR: 20.0000 meq | EXTENDED_RELEASE_TABLET | Freq: Every day | ORAL | Status: DC

## 2015-01-08 MED ORDER — DULOXETINE HCL 60 MG PO CPEP: 60.0000 mg | ORAL_CAPSULE | Freq: Every day | ORAL | Status: DC

## 2015-01-08 MED ORDER — VITAMIN D3 125 MCG (5000 UT) PO CAPS: 1.0000 | ORAL_CAPSULE | Freq: Every day | ORAL | Status: DC

## 2015-01-08 MED ORDER — FOLIC ACID 1 MG PO TABS: 1.0000 mg | ORAL_TABLET | Freq: Every day | ORAL | Status: DC

## 2015-01-08 MED ORDER — DIPHENHYDRAMINE HCL 12.5 MG/5ML PO ELIX: 25.0000 mg | ORAL_SOLUTION | ORAL | Status: DC | PRN

## 2015-01-08 MED ORDER — POLYETHYLENE GLYCOL 3350 17 G PO PACK: 17.0000 g | PACK | Freq: Every day | ORAL | Status: DC | PRN

## 2015-01-08 MED ORDER — BUPRENORPHINE HCL 75 MCG BU FILM: 75.0000 ug | ORAL_FILM | Freq: Two times a day (BID) | BUCCAL | Status: DC

## 2015-01-08 MED ORDER — SENNOSIDES-DOCUSATE SODIUM 8.6-50 MG PO TABS: 1.0000 | ORAL_TABLET | Freq: Two times a day (BID) | ORAL | Status: DC

## 2015-01-08 MED ORDER — ONDANSETRON HCL 4 MG/2ML IJ SOLN: 4.0000 mg | Freq: Four times a day (QID) | INTRAMUSCULAR | Status: DC | PRN

## 2015-01-08 MED ORDER — SODIUM CHLORIDE 0.9 % IJ SOLN
10.0000 mL | INTRAMUSCULAR | Status: AC | PRN
  Administered 2015-01-08: 10 mL

## 2015-01-08 NOTE — H&P (Signed)
[facility] History and Physical  Brittany Foster Torrence [id number] DOB: 12/21/1985 DOA: 01/08/2015  PCP: Altha HarmMATTHEWS,[name], MD   Chief Complaint: Pain  HPI: Brittany Foster Sperling is a 29 y.o. female with history of sickle cell anemia and chronic pain syndrome on high-dose opiate presented today for a follow up visit have an outpatient clinic. She is complained of pain that is 9 out of 10 not responding to her home medication, she had doubled on her home pain medication for which reason she is out of her Percocet. She was recently fired from her pain clinic for less pill counts than expected, she is not due for medication refill until about 8 days from today. Patient thinks someone/roommate may have taken some of her medications because even though she knows she took more than prescribed, she is sure it was not as many as is missing. Patient is requesting to be admitted to the day hospital for pain control.  She denies chest pain. No shortness of breath.  Systemic Review: General: The patient denies anorexia, fever, weight loss Cardiac: Denies chest pain, syncope, palpitations, pedal edema  Respiratory: Denies cough, shortness of breath, wheezing GI: Denies severe indigestion/heartburn, abdominal pain, nausea, vomiting, diarrhea and constipation GU: Denies hematuria, incontinence, dysuria  Musculoskeletal: Denies arthritis  Skin: Denies suspicious skin lesions Neurologic: Denies focal weakness or numbness, change in vision  Past Medical History  Diagnosis Date  . Sickle cell anemia [facility](HCC)     Past Surgical History  Procedure Laterality Date  . Tubal ligation    . Cholecystectomy    . Cesarean section    . Port a cath placement Right     about 6-7 years ago  . Cholecystectomy  2000    Allergies  Allergen Reactions  . Ultram [Tramadol] Other (See Comments)    seizures  . Morphine And Related Hives, Rash and Other (See Comments)    Shaking Tolerates Percocet, Norco, and  buprenorphine    Family History  Problem Relation Age of Onset  . Sickle cell anemia Other   . Sickle cell trait Father   . Sickle cell trait Mother       Prior to Admission medications   Medication Sig Start Date End Date Taking? Authorizing Provider  Buprenorphine HCl (BELBUCA) 75 MCG FILM Place 75 mcg inside cheek 2 (two) times daily. 01/08/15  Yes Quentin Angstlugbemiga E Breyden Jeudy, MD  DULoxetine (CYMBALTA) 60 MG capsule Take 1 capsule (60 mg total) by mouth daily. 01/08/15  Yes Quentin Angstlugbemiga E Admire Bunnell, MD  folic acid (FOLVITE) 1 MG tablet Take 1 tablet (1 mg total) by mouth daily. 01/08/15  Yes Quentin Angstlugbemiga E Lynx Goodrich, MD  gabapentin (NEURONTIN) 300 MG capsule Take 1 capsule (300 mg total) by mouth 3 (three) times daily. 02/14/14  Yes Altha HarmMichelle A [name], MD  hydroxyurea (HYDREA) 500 MG capsule Take 2 capsules (1,000 mg total) by mouth daily. May take with food to minimize GI side effects. 11/09/14  Yes Massie MaroonLachina M [name], FNP  oxyCODONE-acetaminophen (PERCOCET) 10-325 MG tablet Take 1 tablet by mouth every 6 (six) hours as needed for pain. No Refill before 02/07/2015 01/08/15  Yes Quentin Angstlugbemiga E Iwao Shamblin, MD  Topiramate ER 100 MG CP24 Take 100 mg by mouth at bedtime.   Yes Historical Provider, MD  Cholecalciferol (VITAMIN D3) 5000 UNITS CAPS Take 1 capsule (5,000 Units total) by mouth daily. 01/08/15   Quentin Angstlugbemiga E Rosaura Bolon, MD  potassium chloride SA (K-DUR,KLOR-CON) 20 MEQ tablet Take 1 tablet (20 mEq total) by mouth daily. 01/08/15  Quentin Angst, MD     Physical Exam: Filed Vitals:   01/08/15 1215  BP: 118/68  Pulse: 94  Temp: 98.8 F (37.1 C)  TempSrc: Oral  Resp: 16  Height: 5' (1.524 m)  Weight: 123 lb (55.792 kg)  SpO2: 100%    General: Alert, awake, afebrile, anicteric, not in obvious distress HEENT: Normocephalic and Atraumatic, Mucous membranes pink                PERRLA; EOM intact; No scleral icterus,                 Nares: Patent, Oropharynx: Clear, Fair Dentition                 Neck:  FROM, no cervical lymphadenopathy, thyromegaly, carotid bruit or JVD;  CHEST WALL: No tenderness  CHEST: Normal respiration, clear to auscultation bilaterally  HEART: Regular rate and rhythm; no murmurs rubs or gallops  BACK: No kyphosis or scoliosis; no CVA tenderness  ABDOMEN: Positive Bowel Sounds, soft, non-tender; no masses, no organomegaly Rectal Exam: deferred EXTREMITIES: No cyanosis, clubbing, or edema SKIN:  no rash or ulceration  CNS: Alert and Oriented x 4, Nonfocal exam, CN 2-12 intact  Labs on Admission:  Basic Metabolic Panel:  Recent Labs Lab 01/03/15 1040  NA 140  K 3.2*  CL 110  CO2 24  GLUCOSE 120*  BUN 9  CREATININE 0.35*  CALCIUM 8.9   Liver Function Tests:  Recent Labs Lab 01/03/15 1040  AST 46*  ALT 31  ALKPHOS 81  BILITOT 2.0*  PROT 7.0  ALBUMIN 3.8   No results for input(s): LIPASE, AMYLASE in the last 168 hours. No results for input(s): AMMONIA in the last 168 hours. CBC: No results for input(s): WBC, NEUTROABS, HGB, HCT, MCV, PLT in the last 168 hours. Cardiac Enzymes: No results for input(s): CKTOTAL, CKMB, CKMBINDEX, TROPONINI in the last 168 hours.  BNP (last 3 results)  Recent Labs  03/18/14 1811 04/29/14 1459  BNP 78.1 114.5*    ProBNP (last 3 results) No results for input(s): PROBNP in the last 8760 hours.  CBG: No results for input(s): GLUCAP in the last 168 hours.   Assessment/Plan Active Problems:   Sickle-cell disease with pain (HCC)   Admits to the Day Hospital  IVF D5 .45% Saline @ 125 mls/hour  Weight based Dilaudid PCA started within 30 minutes of admission  IV Toradol   Monitor vitals very closely, Re-evaluate pain scale every hour  Oxygen by [place]  Patient will be re-evaluated for pain in the context of function and relationship to baseline as care progresses.  If no significant relieve from pain (remains above 5/10) will transfer patient to inpatient services for further evaluation and  management  Code Status: Full  Family Communication: None  DVT Prophylaxis: Ambulate as tolerated   Time spent: 35 Minutes  Willett Lefeber, MD, MHA, FACP, FAAP, CPE  If 7PM-7AM, please contact night-coverage [email or web address] 01/08/2015, 12:20 PM

## 2015-01-08 NOTE — Discharge Summary (Signed)
Physician Discharge Summary  Brittany Foster WUJ:[id number] DOB: 1985-10-17 DOA: 01/08/2015  PCP: [name],[name], MD  Admit date: 01/08/2015  Discharge date: 01/08/2015  Time spent: 30 minutes  Discharge Diagnoses:  Active Problems:   Sickle-cell disease with pain (HCC)   Discharge Condition: Stable  Diet recommendation: Regular  Filed Weights   01/08/15 1215  Weight: 123 lb (55.792 kg)    History of present illness:  Brittany Foster is a 29 y.o. female with history of sickle cell anemia and chronic pain syndrome on high-dose opiate presented today for a follow up visit have an outpatient clinic. She is complained of pain that is 9 out of 10 not responding to her home medication, she had doubled on her home pain medication for which reason she is out of her Percocet. She was recently fired from her pain clinic for less pill counts than expected, she is not due for medication refill until about 8 days from today. Patient thinks someone/roommate may have taken some of her medications because even though she knows she took more than prescribed, she is sure it was not as many as is missing. Patient is requesting to be admitted to the day hospital for pain control.  She denies chest pain. No shortness of breath.   Hospital Course:  Brittany Foster was admitted to the day hospital with sickle cell painful crisis. Patient was treated with weight based IV Dilaudid PCA, IV Toradol as well as IV fluids. Brittany Foster showed significant improvement symptomatically and pain went down from 9 to 5 out of 10 at the time of discharge. Brittany Foster will follow-up in the clinic as previously scheduled, continue with home medications as per prior to admission. She was given a new prescription for Percocet with clear instruction of usage and consequences of abuse. Patient verbalized understanding.  Discharge Exam: Filed Vitals:   01/08/15 1538  BP: 106/67  Pulse: 88  Temp:   Resp: 15    General appearance:  alert, cooperative and no distress Eyes: conjunctivae/corneas clear. PERRL, EOM's intact. Fundi benign. Neck: no adenopathy, no carotid bruit, no JVD, supple, symmetrical, trachea midline and thyroid not enlarged, symmetric, no tenderness/mass/nodules Back: symmetric, no curvature. ROM normal. No CVA tenderness. Resp: clear to auscultation bilaterally Chest wall: no tenderness Cardio: regular rate and rhythm, S1, S2 normal, no murmur, click, rub or gallop GI: soft, non-tender; bowel sounds normal; no masses, no organomegaly Extremities: extremities normal, atraumatic, no cyanosis or edema Pulses: 2+ and symmetric Skin: Skin color, texture, turgor normal. No rashes or lesions Neurologic: Grossly normal  Discharge Instructions We discussed the need for good hydration, monitoring of hydration status, avoidance of heat, cold, stress, and infection triggers. We discussed the need to be compliant with taking Hydrea. Brittany Foster was reminded of the need to seek medical attention of any symptoms of bleeding, anemia, or infection occurs.   Current Discharge Medication List    START taking these medications   Details  Buprenorphine HCl (BELBUCA) 75 MCG FILM Place 75 mcg inside cheek 2 (two) times daily. Qty: 60 each, Refills: 0   Associated Diagnoses: Hb-SS disease without crisis (HCC); Chronic pain syndrome    DULoxetine (CYMBALTA) 60 MG capsule Take 1 capsule (60 mg total) by mouth daily. Qty: 30 capsule, Refills: 3   Associated Diagnoses: Chronic pain syndrome    folic acid (FOLVITE) 1 MG tablet Take 1 tablet (1 mg total) by mouth daily. Qty: 90 tablet, Refills: 11   Associated Diagnoses: Hb-SS disease without crisis (HCC)  oxyCODONE-acetaminophen (PERCOCET) 10-325 MG tablet Take 1 tablet by mouth every 6 (six) hours as needed for pain. No Refill before 02/07/2015 Qty: 120 tablet, Refills: 0   Associated Diagnoses: Hb-SS disease without crisis (HCC)    Cholecalciferol (VITAMIN D3) 5000  UNITS CAPS Take 1 capsule (5,000 Units total) by mouth daily. Qty: 30 capsule, Refills: 5   Associated Diagnoses: Vitamin D deficiency    potassium chloride SA (K-DUR,KLOR-CON) 20 MEQ tablet Take 1 tablet (20 mEq total) by mouth daily. Qty: 30 tablet, Refills: 2   Associated Diagnoses: Hypokalemia      CONTINUE these medications which have NOT CHANGED   Details  gabapentin (NEURONTIN) 300 MG capsule Take 1 capsule (300 mg total) by mouth 3 (three) times daily. Qty: 90 capsule, Refills: 2    hydroxyurea (HYDREA) 500 MG capsule Take 2 capsules (1,000 mg total) by mouth daily. May take with food to minimize GI side effects. Qty: 60 capsule, Refills: 3   Associated Diagnoses: Hb-SS disease without crisis (HCC)    Topiramate ER 100 MG CP24 Take 100 mg by mouth at bedtime.       Allergies  Allergen Reactions  . Ultram [Tramadol] Other (See Comments)    seizures  . Morphine And Related Hives, Rash and Other (See Comments)    Shaking Tolerates Percocet, Norco, and buprenorphine     Significant Diagnostic Studies: No results found.  Signed:  Jeanann LewandowskyJEGEDE, Sha Burling MD, MHA, FACP, FAAP, CPE   01/08/2015, 4:42 PM

## 2015-01-08 NOTE — Progress Notes (Addendum)
[facility] clinic with c/o sickle cell pain. Pt stated her pain was 9/10 on admission. Pt was hydrated with IV fluids and Dilaudid pca and a dose of Toradol. Pt stated her pain only came down to an 8.  Pt had used 11.5 mg of Dilaudid, 30 demands and 21 delivers. Pt was given a prescription for her pain med and signed a pain contract with verbal understanding. Was discharge to home with verbal understanding.

## 2015-01-08 NOTE — Patient Instructions (Signed)
Chronic Pain °Chronic pain can be defined as pain that is off and on and lasts for 3-6 months or longer. Many things cause chronic pain, which can make it difficult to make a diagnosis. There are many treatment options available for chronic pain. However, finding a treatment that works well for you may require trying various approaches until the right one is found. Many people benefit from a combination of two or more types of treatment to control their pain. °SYMPTOMS  °Chronic pain can occur anywhere in the body and can range from mild to very severe. Some types of chronic pain include: °· Headache. °· Low back pain. °· Cancer pain. °· Arthritis pain. °· Neurogenic pain. This is pain resulting from damage to nerves. ° People with chronic pain may also have other symptoms such as: °· Depression. °· Anger. °· Insomnia. °· Anxiety. °DIAGNOSIS  °Your health care provider will help diagnose your condition over time. In many cases, the initial focus will be on excluding possible conditions that could be causing the pain. Depending on your symptoms, your health care provider may order tests to diagnose your condition. Some of these tests may include:  °· Blood tests.   °· CT scan.   °· MRI.   °· X-rays.   °· Ultrasounds.   °· Nerve conduction studies.   °You may need to see a specialist.  °TREATMENT  °Finding treatment that works well may take time. You may be referred to a pain specialist. He or she may prescribe medicine or therapies, such as:  °· Mindful meditation or yoga. °· Shots (injections) of numbing or pain-relieving medicines into the spine or area of pain. °· Local electrical stimulation. °· Acupuncture.   °· Massage therapy.   °· Aroma, color, light, or sound therapy.   °· Biofeedback.   °· Working with a physical therapist to keep from getting stiff.   °· Regular, gentle exercise.   °· Cognitive or behavioral therapy.   °· Group support.   °Sometimes, surgery may be recommended.  °HOME CARE INSTRUCTIONS   °· Take all medicines as directed by your health care provider.   °· Lessen stress in your life by relaxing and doing things such as listening to calming music.   °· Exercise or be active as directed by your health care provider.   °· Eat a healthy diet and include things such as vegetables, fruits, fish, and lean meats in your diet.   °· Keep all follow-up appointments with your health care provider.   °· Attend a support group with others suffering from chronic pain. °SEEK MEDICAL CARE IF:  °· Your pain gets worse.   °· You develop a new pain that was not there before.   °· You cannot tolerate medicines given to you by your health care provider.   °· You have new symptoms since your last visit with your health care provider.   °SEEK IMMEDIATE MEDICAL CARE IF:  °· You feel weak.   °· You have decreased sensation or numbness.   °· You lose control of bowel or bladder function.   °· Your pain suddenly gets much worse.   °· You develop shaking. °· You develop chills. °· You develop confusion. °· You develop chest pain. °· You develop shortness of breath.   °MAKE SURE YOU: °· Understand these instructions. °· Will watch your condition. °· Will get help right away if you are not doing well or get worse. °  °This information is not intended to replace advice given to you by your health care provider. Make sure you discuss any questions you have with your health care provider. °  °Document Released: 11/08/2001   Document Revised: 10/19/2012 Document Reviewed: 08/12/2012 °Elsevier Interactive Patient Education ©2016 [employer] °Sickle Cell Anemia, Adult °Sickle cell anemia is a condition in which red blood cells have an abnormal "sickle" shape. This abnormal shape shortens the cells' life span, which results in a lower than normal concentration of red blood cells in the blood. The sickle shape also causes the cells to clump together and block free blood flow through the blood vessels. As a result, the tissues and organs  of the body do not receive enough oxygen. Sickle cell anemia causes organ damage and pain and increases the risk of infection. °CAUSES  °Sickle cell anemia is a genetic disorder. Those who receive two copies of the gene have the condition, and those who receive one copy have the trait. °RISK FACTORS °The sickle cell gene is most common in people whose families originated in [place]. Other areas of the globe where sickle cell trait occurs include the Mediterranean, [place] and [place], the [place], and the [place].  °SIGNS AND SYMPTOMS °· Pain, especially in the extremities, back, chest, or abdomen (common). The pain may start suddenly or may develop following an illness, especially if there is dehydration. Pain can also occur due to overexertion or exposure to extreme temperature changes. °· Frequent severe bacterial infections, especially certain types of pneumonia and meningitis. °· Pain and swelling in the hands and feet. °· Decreased activity.   °· Loss of appetite.   °· Change in behavior. °· Headaches. °· Seizures. °· Shortness of breath or difficulty breathing. °· Vision changes. °· Skin ulcers. °Those with the trait may not have symptoms or they may have mild symptoms.  °DIAGNOSIS  °Sickle cell anemia is diagnosed with blood tests that demonstrate the genetic trait. It is often diagnosed during the newborn period, due to mandatory testing nationwide. A variety of blood tests, X-rays, CT scans, MRI scans, ultrasounds, and lung function tests may also be done to monitor the condition. °TREATMENT  °Sickle cell anemia may be treated with: °· Medicines. You may be given pain medicines, antibiotic medicines (to treat and prevent infections) or medicines to increase the production of certain types of hemoglobin. °· Fluids. °· Oxygen. °· Blood transfusions. °HOME CARE INSTRUCTIONS  °· Drink enough fluid to keep your urine clear or pale yellow. Increase your fluid intake in hot weather and during  exercise. °· Do not smoke. Smoking lowers oxygen levels in the blood.   °· Only take over-the-counter or prescription medicines for pain, fever, or discomfort as directed by your health care provider. °· Take antibiotics as directed by your health care provider. Make sure you finish them it even if you start to feel better.   °· Take supplements as directed by your health care provider.   °· Consider wearing a medical alert bracelet. This tells anyone caring for you in an emergency of your condition.   °· When traveling, keep your medical information, health care provider's names, and the medicines you take with you at all times.   °· If you develop a fever, do not take medicines to reduce the fever right away. This could cover up a problem that is developing. Notify your health care provider. °· Keep all follow-up appointments with your health care provider. Sickle cell anemia requires regular medical care. °SEEK MEDICAL CARE IF: ° You have a fever. °SEEK IMMEDIATE MEDICAL CARE IF:  °· You feel dizzy or faint.   °· You have new abdominal pain, especially on the left side near the stomach area.   °· You develop a persistent,   often uncomfortable and painful penile erection (priapism). If this is not treated immediately it will lead to impotence.   °· You have numbness your arms or legs or you have a hard time moving them.   °· You have a hard time with speech.   °· You have a fever or persistent symptoms for more than 2-3 days.   °· You have a fever and your symptoms suddenly get worse.   °· You have signs or symptoms of infection. These include:   °¨ Chills.   °¨ Abnormal tiredness (lethargy).   °¨ Irritability.   °¨ Poor eating.   °¨ Vomiting.   °· You develop pain that is not helped with medicine.   °· You develop shortness of breath. °· You have pain in your chest.   °· You are coughing up pus-like or bloody sputum.   °· You develop a stiff neck. °· Your feet or hands swell or have pain. °· Your abdomen appears  bloated. °· You develop joint pain. °MAKE SURE YOU: °· Understand these instructions. °  °This information is not intended to replace advice given to you by your health care provider. Make sure you discuss any questions you have with your health care provider. °  °Document Released: 05/27/2005 Document Revised: 03/09/2014 Document Reviewed: 09/28/2012 °Elsevier Interactive Patient Education ©2016 Elsevier Inc. ° °

## 2015-01-08 NOTE — Progress Notes (Signed)
ASSESSMENT: Pt currently experiencing symptoms of depression. [facility] and take [facility] med[facility] as prescribed;  would benefit from psychoeducation and supportive counseling regarding coping with symptoms of depression.  Stage of Change: precontemplative  PLAN: 1. F/U with behavioral health consultant in as needed 2. Psychiatric Medications: Cymbalta, Vitamin D 3. Behavioral recommendation(s):   -[facility] -Consider reading educational material regarding coping with depression (and chronic pain)  SUBJECTIVE: Pt. referred by Dr Hyman HopesJegede for depression:  Pt. reports the following symptoms/concerns: Pt states she feels "bad", does not take her BH meds because she is uncertain they are even at the pharmacy, and that [facility] "[facility] down and talk" some more; feels tired, no energy or motivation.  Duration of problem: unknown Severity: moderate  OBJECTIVE: Orientation & Cognition: Oriented x3. Thought processes normal and appropriate to situation. Mood: teary. Affect: appropriate Appearance: appropriate Risk of harm to self or others: no known risk of harm to self or others Substance use: none  Assessments administered: (How far down in 20-fit pit, regarding feelings of depression: "halfway down")  Diagnosis: Depression CPT Code: F32.9 -------------------------------------------- Other(s) present in the room: none  Time spent with patient in exam room: 16 minutes

## 2015-01-08 NOTE — Progress Notes (Signed)
Patient here for F/U.  Patient requesting day hospital.  Patient complains of pain all over scaled at an 8.

## 2015-01-08 NOTE — Progress Notes (Signed)
Patient ID: Brittany Foster, female   DOB: 1985-05-09, 29 y.o.   MRN: [id number]   Brittany Foster, is a 29 y.o. female  JYN:[id number]  [id number]:[id number]  DOB - 28-Oct-1985  No chief complaint on file.       Subjective:   Brittany Foster is a 29 y.o. female with history of sickle cell anemia and chronic pain syndrome on high-dose opiate prescription here today for a follow up visit. She is complaining of pain that is 9 out of 10 not responding to her home medication, she had double now on her home pain medication for which reason she is out of her Percocet. She was recently fired from her pain clinic for less pill counts, she is not due for medication refill until about 8 days from today. Patient thinks someone/roommate may have taken some of her medications because even though she knows she took more than prescribed and she is sure it was not as many as is missing. Patient is requesting to be admitted to the day hospital for pain control. Patient has No headache, No chest pain, No abdominal pain - No Nausea, No new weakness tingling or numbness, No Cough - SOB.  Problem  Vitamin D Deficiency  Chronic Pain Syndrome    ALLERGIES: Allergies  Allergen Reactions  . Ultram [Tramadol] Other (See Comments)    seizures  . Morphine And Related Hives, Rash and Other (See Comments)    Shaking Tolerates Percocet, Norco, and buprenorphine    PAST MEDICAL HISTORY: Past Medical History  Diagnosis Date  . Sickle cell anemia (HCC)     MEDICATIONS AT HOME: Prior to Admission medications   Medication Sig Start Date End Date Taking? Authorizing Provider  Buprenorphine HCl (BELBUCA) 75 MCG FILM Place 75 mcg inside cheek 2 (two) times daily. 01/08/15  Yes Quentin Angst, MD  DULoxetine (CYMBALTA) 60 MG capsule Take 1 capsule (60 mg total) by mouth daily. 01/08/15  Yes Quentin Angst, MD  folic acid (FOLVITE) 1 MG tablet Take 1 tablet (1 mg total) by mouth daily. 01/08/15  Yes Quentin Angst, MD   gabapentin (NEURONTIN) 300 MG capsule Take 1 capsule (300 mg total) by mouth 3 (three) times daily. 02/14/14  Yes Altha Harm, MD  hydroxyurea (HYDREA) 500 MG capsule Take 2 capsules (1,000 mg total) by mouth daily. May take with food to minimize GI side effects. 11/09/14  Yes [name], FNP  oxyCODONE-acetaminophen (PERCOCET) 10-325 MG tablet Take 1 tablet by mouth every 6 (six) hours as needed for pain. No Refill before 02/07/2015 01/08/15  Yes Quentin Angst, MD  Topiramate ER 100 MG CP24 Take 100 mg by mouth at bedtime.   Yes Historical Provider, MD  Cholecalciferol (VITAMIN D3) 5000 UNITS CAPS Take 1 capsule (5,000 Units total) by mouth daily. 01/08/15   Quentin Angst, MD  potassium chloride SA (K-DUR,KLOR-CON) 20 MEQ tablet Take 1 tablet (20 mEq total) by mouth daily. 01/08/15   Quentin Angst, MD     Objective:   Filed Vitals:   01/08/15 1121  BP: 114/78  Pulse: 101  Temp: 98.7 F (37.1 C)  TempSrc: Oral  Resp: 20  Height: 5' (1.524 m)  Weight: 123 lb (55.792 kg)  SpO2: 95%    Exam General appearance : Awake, alert, not in any distress. Speech Clear. Not toxic looking HEENT: Atraumatic and Normocephalic, pupils equally reactive to light and accomodation Neck: supple, no JVD. No cervical lymphadenopathy.  Chest:Good air entry bilaterally, no  added sounds  CVS: S1 S2 regular, no murmurs.  Abdomen: Bowel sounds present, Non tender and not distended with no gaurding, rigidity or rebound. Extremities: B/L Lower Ext shows no edema, both legs are warm to touch Neurology: Awake alert, and oriented X 3, CN II-XII intact, Non focal  Data Review No results found for: HGBA1C   Assessment & Plan   1. Hb-SS disease without crisis (HCC) Refill - folic acid (FOLVITE) 1 MG tablet; Take 1 tablet (1 mg total) by mouth daily.  Dispense: 90 tablet; Refill: 11 - oxyCODONE-acetaminophen (PERCOCET) 10-325 MG tablet; Take 1 tablet by mouth every 6 (six) - 8  (eight) hours as needed for pain. No Refill before 02/07/2015  Dispense: 120 tablet; Refill: 0 - Buprenorphine HCl (BELBUCA) 75 MCG FILM; Place 75 mcg inside cheek 2 (two) times daily.  Dispense: 60 each; Refill: 0  Brittany Foster is being given another chance to get her opiate medication from this clinic with an understanding that she will desist from abusing the system and follow-up in the day hospital and outpatient clinic regularly.  We agreed on Opiate dose and amount of pills  per month. We discussed that pt is to receive Schedule II prescriptions only from our clinic. Pt is also aware that the prescription history is available to [place] online through the [facility] CSRS. Controlled substance agreement reviewed and signed. We reminded Brittany Foster that all patients receiving Schedule II narcotics must be seen for follow within one month of prescription being requested. We reviewed the terms of our pain agreement, including the need to keep medicines in a safe locked location away from children or pets, and the need to report excess sedation or constipation, measures to avoid constipation, and policies related to early refills and stolen prescriptions. According to the [place] [facility] program, we have reviewed details related to analgesia, adverse effects and aberrant behaviors.  Continue Hydrea. We discussed the need for good hydration, monitoring of hydration status, avoidance of heat, cold, stress, and infection triggers. We discussed the risks and benefits of Hydrea, including bone marrow suppression, the possibility of GI upset, skin ulcers, hair thinning, and teratogenicity. The patient was reminded of the need to seek medical attention of any symptoms of bleeding, anemia, or infection. Continue folic acid 1 mg daily to prevent aplastic bone marrow crises.   2. Vitamin D deficiency  - Cholecalciferol (VITAMIN D3) 5000 UNITS CAPS; Take 1 capsule (5,000 Units total) by mouth daily.  Dispense: 30 capsule;  Refill: 5  3. Chronic pain syndrome Continue - DULoxetine (CYMBALTA) 60 MG capsule; Take 1 capsule (60 mg total) by mouth daily.  Dispense: 30 capsule; Refill: 3 - Buprenorphine HCl (BELBUCA) 75 MCG FILM; Place 75 mcg inside cheek 2 (two) times daily.  Dispense: 60 each; Refill: 0  4. Hypokalemia  - potassium chloride SA (K-DUR,KLOR-CON) 20 MEQ tablet; Take 1 tablet (20 mEq total) by mouth daily.  Dispense: 30 tablet; Refill: 2 Patient have been counseled extensively about nutrition and exercise  Return in about 4 weeks (around 02/05/2015) for Sickle Cell Disease/Pain, Generalized Anxiety Disorder.  The patient was given clear instructions to go to ER or return to medical center if symptoms don't improve, worsen or new problems develop. The patient verbalized understanding. The patient was told to call to get lab results if they haven't heard anything in the next week.   This note has been created with Education officer, environmental. Any transcriptional errors are unintentional.  Brittany Foster, Brittany Waner, MD, MHA, Maxwell CaulFACP, FAAP, CPE [facility] [place], [place] [phone]   01/08/2015, 12:10 PM

## 2015-01-09 ENCOUNTER — Other Ambulatory Visit: Payer: Self-pay | Admitting: Internal Medicine

## 2015-01-09 DIAGNOSIS — D571 Sickle-cell disease without crisis: Secondary | ICD-10-CM

## 2015-01-09 MED ORDER — OXYCODONE-ACETAMINOPHEN 10-325 MG PO TABS: 1.0000 | ORAL_TABLET | Freq: Four times a day (QID) | ORAL | Status: DC | PRN

## 2015-01-15 ENCOUNTER — Ambulatory Visit: Payer: Medicaid Other | Admitting: Internal Medicine

## 2015-01-21 ENCOUNTER — Non-Acute Institutional Stay (HOSPITAL_COMMUNITY)
Admission: AD | Admit: 2015-01-21 | Discharge: 2015-01-21 | Disposition: A | Discharge status: Home / Self Care | Payer: Medicaid Other | Attending: Internal Medicine | Admitting: Internal Medicine

## 2015-01-21 ENCOUNTER — Telehealth (HOSPITAL_COMMUNITY): Payer: Self-pay | Admitting: *Deleted

## 2015-01-21 DIAGNOSIS — D57819 Other sickle-cell disorders with crisis, unspecified: Secondary | ICD-10-CM

## 2015-01-21 DIAGNOSIS — M545 Low back pain: Secondary | ICD-10-CM | POA: Diagnosis present

## 2015-01-21 DIAGNOSIS — D5700 Hb-SS disease with crisis, unspecified: Secondary | ICD-10-CM | POA: Diagnosis not present

## 2015-01-21 DIAGNOSIS — Z79891 Long term (current) use of opiate analgesic: Secondary | ICD-10-CM | POA: Diagnosis not present

## 2015-01-21 DIAGNOSIS — Z79899 Other long term (current) drug therapy: Secondary | ICD-10-CM | POA: Insufficient documentation

## 2015-01-21 DIAGNOSIS — Z87891 Personal history of nicotine dependence: Secondary | ICD-10-CM | POA: Insufficient documentation

## 2015-01-21 LAB — CBC WITH DIFFERENTIAL/PLATELET
BASOS ABS: 0 10*3/uL (ref 0.0–0.1)
Basophils Relative: 0 %
EOS ABS: 0 10*3/uL (ref 0.0–0.7)
Eosinophils Relative: 0 %
HCT: 26.8 % — ABNORMAL LOW (ref 36.0–46.0)
HEMOGLOBIN: 9.2 g/dL — AB (ref 12.0–15.0)
LYMPHS ABS: 1.3 10*3/uL (ref 0.7–4.0)
LYMPHS PCT: 11 %
MCH: 34.8 pg — AB (ref 26.0–34.0)
MCHC: 34.3 g/dL (ref 30.0–36.0)
MCV: 101.5 fL — ABNORMAL HIGH (ref 78.0–100.0)
Monocytes Absolute: 0.7 10*3/uL (ref 0.1–1.0)
Monocytes Relative: 5 %
NEUTROS PCT: 84 %
Neutro Abs: 9.9 10*3/uL — ABNORMAL HIGH (ref 1.7–7.7)
Platelets: 287 10*3/uL (ref 150–400)
RBC: 2.64 MIL/uL — AB (ref 3.87–5.11)
RDW: 18.7 % — ABNORMAL HIGH (ref 11.5–15.5)
WBC: 11.9 10*3/uL — AB (ref 4.0–10.5)

## 2015-01-21 LAB — COMPREHENSIVE METABOLIC PANEL
ALBUMIN: 4.3 g/dL (ref 3.5–5.0)
ALK PHOS: 107 U/L (ref 38–126)
ALT: 32 U/L (ref 14–54)
ANION GAP: 7 (ref 5–15)
AST: 53 U/L — ABNORMAL HIGH (ref 15–41)
BUN: 12 mg/dL (ref 6–20)
CALCIUM: 9.1 mg/dL (ref 8.9–10.3)
CHLORIDE: 112 mmol/L — AB (ref 101–111)
CO2: 20 mmol/L — AB (ref 22–32)
Creatinine, Ser: 0.49 mg/dL (ref 0.44–1.00)
GFR calc non Af Amer: >60 mL/min (ref >60)
Glucose, Bld: 98 mg/dL (ref 65–99)
POTASSIUM: 3.9 mmol/L (ref 3.5–5.1)
SODIUM: 139 mmol/L (ref 135–145)
Total Bilirubin: 1.7 mg/dL — ABNORMAL HIGH (ref 0.3–1.2)
Total Protein: 8.1 g/dL (ref 6.5–8.1)

## 2015-01-21 LAB — RETICULOCYTES
RBC.: 2.64 MIL/uL — ABNORMAL LOW (ref 3.87–5.11)
RETIC COUNT ABSOLUTE: 224.4 10*3/uL — AB (ref 19.0–186.0)
RETIC CT PCT: 8.5 % — AB (ref 0.4–3.1)

## 2015-01-21 LAB — LACTATE DEHYDROGENASE: LDH: 276 U/L — AB (ref 98–192)

## 2015-01-21 MED ORDER — DEXTROSE-NACL 5-0.45 % IV SOLN
INTRAVENOUS | Status: DC
  Administered 2015-01-21: 11:00:00 via INTRAVENOUS

## 2015-01-21 MED ORDER — ONDANSETRON HCL 4 MG/2ML IJ SOLN: 4.0000 mg | Freq: Four times a day (QID) | INTRAMUSCULAR | Status: DC | PRN

## 2015-01-21 MED ORDER — OXYCODONE-ACETAMINOPHEN 5-325 MG PO TABS
1.0000 | ORAL_TABLET | Freq: Once | ORAL | Status: AC
  Administered 2015-01-21: 1 via ORAL
  Filled 2015-01-21: qty 1

## 2015-01-21 MED ORDER — NALOXONE HCL 0.4 MG/ML IJ SOLN: 0.4000 mg | INTRAMUSCULAR | Status: DC | PRN

## 2015-01-21 MED ORDER — SODIUM CHLORIDE 0.9 % IV SOLN
12.5000 mg | Freq: Four times a day (QID) | INTRAVENOUS | Status: DC | PRN
  Administered 2015-01-21: 12.5 mg via INTRAVENOUS
  Filled 2015-01-21 (×3): qty 0.25

## 2015-01-21 MED ORDER — HEPARIN SOD (PORK) LOCK FLUSH 100 UNIT/ML IV SOLN
500.0000 [IU] | INTRAVENOUS | Status: AC | PRN
  Administered 2015-01-21: 500 [IU]

## 2015-01-21 MED ORDER — DIPHENHYDRAMINE HCL 12.5 MG/5ML PO ELIX: 12.5000 mg | ORAL_SOLUTION | Freq: Four times a day (QID) | ORAL | Status: DC | PRN

## 2015-01-21 MED ORDER — HEPARIN SOD (PORK) LOCK FLUSH 100 UNIT/ML IV SOLN: 500.0000 [IU] | INTRAVENOUS | Status: DC | PRN

## 2015-01-21 MED ORDER — SODIUM CHLORIDE 0.9 % IJ SOLN: 9.0000 mL | INTRAMUSCULAR | Status: DC | PRN

## 2015-01-21 MED ORDER — HYDROMORPHONE 1 MG/ML IV SOLN
INTRAVENOUS | Status: DC
  Administered 2015-01-21: 11:00:00 via INTRAVENOUS
  Administered 2015-01-21: 15.5 mg via INTRAVENOUS
  Filled 2015-01-21: qty 25

## 2015-01-21 MED ORDER — OXYCODONE HCL 5 MG PO TABS
5.0000 mg | ORAL_TABLET | Freq: Once | ORAL | Status: AC
  Administered 2015-01-21: 5 mg via ORAL
  Filled 2015-01-21: qty 1

## 2015-01-21 MED ORDER — SODIUM CHLORIDE 0.9 % IJ SOLN: 10.0000 mL | INTRAMUSCULAR | Status: DC | PRN

## 2015-01-21 MED ORDER — KETOROLAC TROMETHAMINE 30 MG/ML IJ SOLN
30.0000 mg | Freq: Once | INTRAMUSCULAR | Status: AC
  Administered 2015-01-21: 30 mg via INTRAVENOUS
  Filled 2015-01-21: qty 1

## 2015-01-21 NOTE — H&P (Signed)
[facility] History and Physical   Date: 01/21/2015  Patient name: Brittany Foster Medical record number: [id number] Date of birth: November 04, 1985 Age: 29 y.o. Gender: female PCP: Jeanann Lewandowsky, MD  Attending physician: Quentin Angst, MD  Chief Complaint: Left rib and low back pain  History of Present Illness: Brittany Foster, a 29 year old female with a history of sickle cell anemia, HbSS presents with left rib and low back. She states that pain is primarily to left ribs and lower extremities. She attributes pain crisis to cold weather. She states that she had Hydrocodone-acetaminophen 10-325 with minimal relief. Current pain intensity is 9/10 described as constant and aching.  She maintains that she is taking all other medications consistently. Patient denies fatigue, headache, shortness of breath, urinary problems, nausea, vomiting, or diarrhea.   Meds: Prescriptions prior to admission  Medication Sig Dispense Refill Last Dose  . Buprenorphine HCl (BELBUCA) 75 MCG FILM Place 75 mcg inside cheek 2 (two) times daily. 60 each 0 01/08/2015 at Unknown time  . Cholecalciferol (VITAMIN D3) 5000 UNITS CAPS Take 1 capsule (5,000 Units total) by mouth daily. 30 capsule 5   . DULoxetine (CYMBALTA) 60 MG capsule Take 1 capsule (60 mg total) by mouth daily. 30 capsule 3 01/08/2015 at Unknown time  . folic acid (FOLVITE) 1 MG tablet Take 1 tablet (1 mg total) by mouth daily. 90 tablet 11 01/08/2015 at Unknown time  . gabapentin (NEURONTIN) 300 MG capsule Take 1 capsule (300 mg total) by mouth 3 (three) times daily. 90 capsule 2 01/08/2015 at Unknown time  . hydroxyurea (HYDREA) 500 MG capsule Take 2 capsules (1,000 mg total) by mouth daily. May take with food to minimize GI side effects. 60 capsule 3 01/08/2015 at Unknown time  . oxyCODONE-acetaminophen (PERCOCET) 10-325 MG tablet Take 1 tablet by mouth every 6 (six) hours as needed for pain. No Refill before 02/07/2015 120 tablet 0    . potassium chloride SA (K-DUR,KLOR-CON) 20 MEQ tablet Take 1 tablet (20 mEq total) by mouth daily. 30 tablet 2   . Topiramate ER 100 MG CP24 Take 100 mg by mouth at bedtime.   01/07/2015 at Unknown time    Allergies: Ultram and Morphine and related Past Medical History  Diagnosis Date  . Sickle cell anemia [facility])    Past Surgical History  Procedure Laterality Date  . Tubal ligation    . Cholecystectomy    . Cesarean section    . Port a cath placement Right     about 6-7 years ago  . Cholecystectomy  2000   Family History  Problem Relation Age of Onset  . Sickle cell anemia Other   . Sickle cell trait Father   . Sickle cell trait Mother    Social History   Social History  . Marital Status: Single    Spouse Name: N/A  . Number of Children: N/A  . Years of Education: N/A   Occupational History  . Not on file.   Social History Main Topics  . Smoking status: Former Games developer  . Smokeless tobacco: Not on file     Comment: smokes once every couple of weeks.   . Alcohol Use: No  . Drug Use: No  . Sexual Activity: Yes    Birth Control/ Protection: Surgical   Other Topics Concern  . Not on file   Social History Narrative    Review of Systems: Constitutional: negative for fatigue and fevers Eyes: negative Ears, nose,  mouth, throat, and face: negative Respiratory: negative for cough, dyspnea on exertion and wheezing Cardiovascular: negative for chest pain, fatigue, orthopnea and tachypnea Gastrointestinal: negative for constipation, diarrhea and nausea Genitourinary:negative Integument/breast: negative Hematologic/lymphatic: negative Musculoskeletal:negative for myalgias Neurological: negative Behavioral/Psych: negative Endocrine: negative Allergic/Immunologic: negative  Physical Exam: There were no vitals taken for this visit. There were no vitals taken for this visit.  General Appearance:    Alert, cooperative, mild distress, appears stated age  Head:     Normocephalic, without obvious abnormality, atraumatic  Eyes:    PERRL, conjunctiva/corneas clear, EOM's intact, fundi    benign, both eyes  Ears:    Normal TM's and external ear canals, both ears  Nose:   Nares normal, septum midline, mucosa normal, no drainage    or sinus tenderness  Throat:   Lips, mucosa, and tongue normal; teeth and gums normal  Neck:   Supple, symmetrical, trachea midline, no adenopathy;    thyroid:  no enlargement/tenderness/nodules; no carotid   bruit or JVD  Back:     Symmetric, no curvature, ROM normal, no CVA tenderness  Lungs:     Clear to auscultation bilaterally, respirations unlabored  Chest Wall:    No tenderness or deformity   Heart:    Regular rate and rhythm, S1 and S2 normal, no murmur, rub   or gallop  Abdomen:     Soft, non-tender, bowel sounds active all four quadrants,    no masses, no organomegaly  Extremities:   Extremities normal, atraumatic, no cyanosis or edema  Pulses:   2+ and symmetric all extremities  Skin:   Skin color, texture, turgor normal, no rashes or lesions  Lymph nodes:   Cervical, supraclavicular, and axillary nodes normal  Neurologic:   CNII-XII intact, normal strength, sensation and reflexes    throughout    Lab results: No results found for this or any previous visit (from the past 24 hour(s)).  Imaging results:  No results found.   Assessment & Plan:  Patient will be admitted to the day infusion center for extended observation  Start IV D5.45 for cellular rehydration at 125/hr  Start Toradol 30 mg IV every 6 hours for inflammation.  Start Dilaudid PCA High Concentration per weight based protocol.   Patient will be re-evaluated for pain intensity in the context of function and relationship to baseline as care progresses.  If no significant pain relief, will transfer patient to inpatient services for a higher level of care.   Will check CMP,  Reticulocytes, LDH and CBC w/differential  Tandre Conly  M 01/21/2015, 10:43 AM

## 2015-01-21 NOTE — Discharge Summary (Signed)
[facility] Discharge Summary   Patient ID: Brittany Foster MRN: [id number] DOB/AGE: 29-Jan-1986 30 y.o.  Admit date: 01/21/2015 Discharge date: 01/21/2015  Primary Care Physician:  Jeanann Lewandowsky, MD  Admission Diagnoses:  Active Problems:   Sickle-cell disease with pain (HCC)  Discharge Medications:    Medication List    TAKE these medications        Buprenorphine HCl 75 MCG Film  Commonly known as:  BELBUCA  Place 75 mcg inside cheek 2 (two) times daily.     DULoxetine 60 MG capsule  Commonly known as:  CYMBALTA  Take 1 capsule (60 mg total) by mouth daily.     folic acid 1 MG tablet  Commonly known as:  FOLVITE  Take 1 tablet (1 mg total) by mouth daily.     gabapentin 300 MG capsule  Commonly known as:  NEURONTIN  Take 1 capsule (300 mg total) by mouth 3 (three) times daily.     hydroxyurea 500 MG capsule  Commonly known as:  HYDREA  Take 2 capsules (1,000 mg total) by mouth daily. May take with food to minimize GI side effects.     oxyCODONE-acetaminophen 10-325 MG tablet  Commonly known as:  PERCOCET  Take 1 tablet by mouth every 6 (six) hours as needed for pain. No Refill before 02/07/2015     potassium chloride SA 20 MEQ tablet  Commonly known as:  K-DUR,KLOR-CON  Take 1 tablet (20 mEq total) by mouth daily.     Topiramate ER 100 MG Cp24  Take 100 mg by mouth at bedtime.     Vitamin D3 5000 UNITS Caps  Take 1 capsule (5,000 Units total) by mouth daily.         Consults:  None  Significant Diagnostic Studies:  No results found.   [facility] Course: -Ms. Tityana Pagan was admitted to the day infusion center for extended observation. Patient was started on a high concentration dilaudid PCA. She used a total of 15.5 mg with 31 demands and 31 deliveries. She reports that pain intensity is 7/10.  -Reviewed labs, consistent with baseliine -Patient was given  Percocet 10-325 mg 30 minutes prior to discharge.  Patient was reminded to schedule a follow up appointment with Dr. Hyman Hopes.  Patient is told to take pain medications as prescribed, hydrate, and contact us if pain intensity increases.  She has an appointment scheduled with Dr. Hyman Hopes -She is alert, oriented, and ambulatory.   Physical Exam at Discharge: BP 91/55 mmHg  Pulse 80  Temp(Src) 98.8 F (37.1 C) (Oral)  Resp 13  Ht 5' (1.524 m)  Wt 123 lb (55.792 kg)  BMI 24.02 kg/m2  SpO2 100%  LMP 01/20/2015 (LMP Unknown)  General Appearance:    Alert, cooperative, no distress, appears stated age  Head:    Normocephalic, without obvious abnormality, atraumatic  Back:     Symmetric, no curvature, ROM normal, no CVA tenderness  Lungs:     Clear to auscultation bilaterally, respirations unlabored  Chest Wall:    No tenderness or deformity   Heart:    Regular rate and rhythm, S1 and S2 normal, no murmur, rub   or gallop  Abdomen:     Soft, non-tender, bowel sounds active all four quadrants,    no masses, no organomegaly     Extremities:   Extremities normal, atraumatic, no cyanosis or edema  Pulses:   2+ and symmetric all extremities  Skin:  Skin color, texture, turgor normal, no rashes or lesions  Lymph nodes:   Cervical, supraclavicular, and axillary nodes normal  Neurologic:   CNII-XII intact, normal strength, sensation and reflexes    throughout   Disposition at Discharge: 01-Home or Self Care  Discharge Orders: Discharge Instructions    Discharge patient    Complete by:  As directed            Condition at Discharge:   Stable  Time spent on Discharge:  15 minutes  Signed: [name],Lachina M 01/21/2015, 5:02 PM

## 2015-01-21 NOTE — Progress Notes (Signed)
[facility]. Pt stated her pain was 8/10 on admission and at discharge her # was 7. Pt placed on Dilaudid PCA and IV fluids. Pt discharged to home with discharge instructions and stated verbal understanding.

## 2015-01-21 NOTE — Telephone Encounter (Signed)
Pt was called and told that she could come [facility] for treatment. Pt voiced understanding.

## 2015-01-21 NOTE — Telephone Encounter (Signed)
Tanda called the [facility] wanting to come in for treatment. Pt stated that her pain was 8/10 and that she took her last pain med at 6 am this morning. Pt was told that we would check with the provider, C. Hart RochesterHollis, NP and call him back. Pt voiced understanding.

## 2015-01-22 ENCOUNTER — Non-Acute Institutional Stay (HOSPITAL_COMMUNITY)
Admission: AD | Admit: 2015-01-22 | Discharge: 2015-01-22 | Disposition: A | Discharge status: Home / Self Care | Payer: Medicaid Other | Attending: Internal Medicine | Admitting: Internal Medicine

## 2015-01-22 ENCOUNTER — Encounter (HOSPITAL_COMMUNITY): Payer: Self-pay | Admitting: Hematology

## 2015-01-22 ENCOUNTER — Telehealth (HOSPITAL_COMMUNITY): Payer: Self-pay | Admitting: Hematology

## 2015-01-22 DIAGNOSIS — Z79899 Other long term (current) drug therapy: Secondary | ICD-10-CM | POA: Diagnosis not present

## 2015-01-22 DIAGNOSIS — D5700 Hb-SS disease with crisis, unspecified: Secondary | ICD-10-CM | POA: Diagnosis present

## 2015-01-22 DIAGNOSIS — Z79891 Long term (current) use of opiate analgesic: Secondary | ICD-10-CM | POA: Insufficient documentation

## 2015-01-22 DIAGNOSIS — Z87891 Personal history of nicotine dependence: Secondary | ICD-10-CM | POA: Insufficient documentation

## 2015-01-22 DIAGNOSIS — M545 Low back pain: Secondary | ICD-10-CM | POA: Diagnosis present

## 2015-01-22 MED ORDER — HEPARIN SOD (PORK) LOCK FLUSH 100 UNIT/ML IV SOLN
500.0000 [IU] | INTRAVENOUS | Status: DC | PRN
  Filled 2015-01-22: qty 5

## 2015-01-22 MED ORDER — ONDANSETRON HCL 4 MG/2ML IJ SOLN: 4.0000 mg | Freq: Four times a day (QID) | INTRAMUSCULAR | Status: DC | PRN

## 2015-01-22 MED ORDER — DIPHENHYDRAMINE HCL 12.5 MG/5ML PO ELIX: 12.5000 mg | ORAL_SOLUTION | Freq: Four times a day (QID) | ORAL | Status: DC | PRN

## 2015-01-22 MED ORDER — HYDROMORPHONE 1 MG/ML IV SOLN
INTRAVENOUS | Status: DC
  Administered 2015-01-22: 11:00:00 via INTRAVENOUS
  Administered 2015-01-22: 17 mg via INTRAVENOUS
  Filled 2015-01-22: qty 25

## 2015-01-22 MED ORDER — OXYCODONE-ACETAMINOPHEN 5-325 MG PO TABS
1.0000 | ORAL_TABLET | Freq: Once | ORAL | Status: AC
  Administered 2015-01-22: 1 via ORAL
  Filled 2015-01-22: qty 1

## 2015-01-22 MED ORDER — SODIUM CHLORIDE 0.9 % IJ SOLN: 10.0000 mL | INTRAMUSCULAR | Status: DC | PRN

## 2015-01-22 MED ORDER — SODIUM CHLORIDE 0.9 % IV SOLN
12.5000 mg | Freq: Four times a day (QID) | INTRAVENOUS | Status: DC | PRN
  Administered 2015-01-22: 12.5 mg via INTRAVENOUS
  Filled 2015-01-22 (×3): qty 0.25

## 2015-01-22 MED ORDER — SODIUM CHLORIDE 0.9 % IJ SOLN: 9.0000 mL | INTRAMUSCULAR | Status: DC | PRN

## 2015-01-22 MED ORDER — NALOXONE HCL 0.4 MG/ML IJ SOLN: 0.4000 mg | INTRAMUSCULAR | Status: DC | PRN

## 2015-01-22 MED ORDER — OXYCODONE HCL 5 MG PO TABS
5.0000 mg | ORAL_TABLET | Freq: Once | ORAL | Status: AC
  Administered 2015-01-22: 5 mg via ORAL
  Filled 2015-01-22: qty 1

## 2015-01-22 MED ORDER — KETOROLAC TROMETHAMINE 30 MG/ML IJ SOLN
30.0000 mg | Freq: Once | INTRAMUSCULAR | Status: AC
  Administered 2015-01-22: 30 mg via INTRAVENOUS
  Filled 2015-01-22: qty 1

## 2015-01-22 MED ORDER — DEXTROSE-NACL 5-0.45 % IV SOLN
INTRAVENOUS | Status: DC
  Administered 2015-01-22: 11:00:00 via INTRAVENOUS

## 2015-01-22 NOTE — Discharge Summary (Signed)
[facility] Discharge Summary   Patient ID: Brittany Foster MRN: [id number] DOB/AGE: 30/03/1985 30 y.o.  Admit date: 01/22/2015 Discharge date: 01/22/2015  Primary Care Physician:  Jeanann LewandowskyJEGEDE, OLUGBEMIGA, MD  Admission Diagnoses:  Active Problems:   Sickle cell anemia with pain [facility](HCC)  Discharge Medications:    Medication List    ASK your doctor about these medications        Buprenorphine HCl 75 MCG Film  Commonly known as:  BELBUCA  Place 75 mcg inside cheek 2 (two) times daily.     DULoxetine 60 MG capsule  Commonly known as:  CYMBALTA  Take 1 capsule (60 mg total) by mouth daily.     folic acid 1 MG tablet  Commonly known as:  FOLVITE  Take 1 tablet (1 mg total) by mouth daily.     gabapentin 300 MG capsule  Commonly known as:  NEURONTIN  Take 1 capsule (300 mg total) by mouth 3 (three) times daily.     hydroxyurea 500 MG capsule  Commonly known as:  HYDREA  Take 2 capsules (1,000 mg total) by mouth daily. May take with food to minimize GI side effects.     oxyCODONE-acetaminophen 10-325 MG tablet  Commonly known as:  PERCOCET  Take 1 tablet by mouth every 6 (six) hours as needed for pain. No Refill before 02/07/2015     potassium chloride SA 20 MEQ tablet  Commonly known as:  K-DUR,KLOR-CON  Take 1 tablet (20 mEq total) by mouth daily.     Topiramate ER 100 MG Cp24  Take 100 mg by mouth at bedtime.     Vitamin D3 5000 UNITS Caps  Take 1 capsule (5,000 Units total) by mouth daily.         Consults:  None  Significant Diagnostic Studies:  No results found.   [facility] Course: -Ms. Brittany CampbellMiranda Satchell was admitted to the day infusion center for extended observation.  Started D5.45 at 125 ml per hour for cellular rehydration.  Patient was started on a high concentration dilaudid PCA. She used a total of 17 mg with 35 demands and 34 deliveries. She reports that pain intensity is 7/10. She received Oxycodone 5 mg 45  minutes prior to discharge. Patient can manage at home on current medication regimen.  -She is alert, oriented, and ambulatory. Patient is to follow up in office as scheduled with Dr. Hyman HopesJegede. Discussed pain regimen with patient at length, she expressed understanding. Recommend that she continue rest and hydration.    Physical Exam at Discharge: BP 90/55 mmHg  Pulse 89  Temp(Src) 98.4 F (36.9 C) (Oral)  Resp 13  Ht 5' (1.524 m)  Wt 123 lb (55.792 kg)  BMI 24.02 kg/m2  SpO2 98%  LMP 01/20/2015 (LMP Unknown)  General Appearance:    Alert, cooperative, no distress, appears stated age  Head:    Normocephalic, without obvious abnormality, atraumatic  Eyes:    PERRL, conjunctiva/corneas clear, EOM's intact, fundi    benign, both eyes  Back:     Symmetric, no curvature, ROM normal, no CVA tenderness  Lungs:     Clear to auscultation bilaterally, respirations unlabored  Chest Wall:    No tenderness or deformity   Heart:    Regular rate and rhythm, S1 and S2 normal, no murmur, rub   or gallop  Extremities:   Extremities normal, atraumatic, no cyanosis or edema  Pulses:   2+ and symmetric all extremities  Skin:  Skin color, texture, turgor normal, no rashes or lesions  Lymph nodes:   Cervical, supraclavicular, and axillary nodes normal  Neurologic:   CNII-XII intact, normal strength, sensation and reflexes    throughout   Disposition at Discharge: 01-Home or Self Care  Discharge Orders:   Condition at Discharge:   Stable  Time spent on Discharge:  15 minutes  Signed: Tiffany Talarico M 01/22/2015, 3:55 PM

## 2015-01-22 NOTE — H&P (Signed)
[facility] History and Physical   Date: 01/22/2015  Patient name: Brittany Foster Medical record number: [id number] Date of birth: 09/07/1985 Age: 29 y.o. Gender: female PCP: Jeanann LewandowskyJEGEDE, OLUGBEMIGA, MD  Attending physician: Quentin Angstlugbemiga E Jegede, MD  Chief Complaint: Left rib and low back pain  History of Present Illness: Brittany CampbellMiranda Insalaco, a 29 year old female with a history of sickle cell anemia, HbSS presents with left rib and low back. Patient was treated in the day infusion center on 01/21/2015. She states that she felt better until around 3 am. She did not take short acting medication prior to going to bed. She states that she started to have increased pain around 3 am described as constant and throbbing. She states that she took long acting Belbuca, percocet 10-325 mg, and a hot bath with minimal relief.  Current pain intensity is 9/10 described as constant and aching.  She maintains that she is taking all other medications consistently. Patient denies fatigue, headache, shortness of breath, urinary problems, nausea, vomiting, or diarrhea.   Meds: Prescriptions prior to admission  Medication Sig Dispense Refill Last Dose  . Buprenorphine HCl (BELBUCA) 75 MCG FILM Place 75 mcg inside cheek 2 (two) times daily. 60 each 0 01/22/2015 at Unknown time  . Cholecalciferol (VITAMIN D3) 5000 UNITS CAPS Take 1 capsule (5,000 Units total) by mouth daily. 30 capsule 5 Past Month at Unknown time  . DULoxetine (CYMBALTA) 60 MG capsule Take 1 capsule (60 mg total) by mouth daily. 30 capsule 3 01/22/2015 at Unknown time  . folic acid (FOLVITE) 1 MG tablet Take 1 tablet (1 mg total) by mouth daily. 90 tablet 11 01/22/2015 at Unknown time  . gabapentin (NEURONTIN) 300 MG capsule Take 1 capsule (300 mg total) by mouth 3 (three) times daily. 90 capsule 2 01/22/2015 at Unknown time  . hydroxyurea (HYDREA) 500 MG capsule Take 2 capsules (1,000 mg total) by mouth daily. May take with food to  minimize GI side effects. 60 capsule 3 01/22/2015 at Unknown time  . oxyCODONE-acetaminophen (PERCOCET) 10-325 MG tablet Take 1 tablet by mouth every 6 (six) hours as needed for pain. No Refill before 02/07/2015 120 tablet 0 01/22/2015 at Unknown time  . potassium chloride SA (K-DUR,KLOR-CON) 20 MEQ tablet Take 1 tablet (20 mEq total) by mouth daily. 30 tablet 2 01/22/2015 at Unknown time  . Topiramate ER 100 MG CP24 Take 100 mg by mouth at bedtime.   01/21/2015 at Unknown time    Allergies: Ultram and Morphine and related Past Medical History  Diagnosis Date  . Sickle cell anemia [facility](HCC)    Past Surgical History  Procedure Laterality Date  . Tubal ligation    . Cholecystectomy    . Cesarean section    . Port a cath placement Right     about 6-7 years ago  . Cholecystectomy  2000   Family History  Problem Relation Age of Onset  . Sickle cell anemia Other   . Sickle cell trait Father   . Sickle cell trait Mother    Social History   Social History  . Marital Status: Single    Spouse Name: N/A  . Number of Children: N/A  . Years of Education: N/A   Occupational History  . Not on file.   Social History Main Topics  . Smoking status: Former Games developermoker  . Smokeless tobacco: Not on file     Comment: smokes once every couple of weeks.   . Alcohol  Use: No  . Drug Use: No  . Sexual Activity: Yes    Birth Control/ Protection: Surgical   Other Topics Concern  . Not on file   Social History Narrative    Review of Systems: Constitutional: negative for fatigue and fevers Eyes: negative Ears, nose, mouth, throat, and face: negative Respiratory: negative for cough, dyspnea on exertion and wheezing Cardiovascular: negative for chest pain, fatigue, orthopnea and tachypnea Gastrointestinal: negative for constipation, diarrhea and nausea Genitourinary:negative Integument/breast: negative Hematologic/lymphatic: negative Musculoskeletal:negative for myalgias Neurological:  negative Behavioral/Psych: negative Endocrine: negative Allergic/Immunologic: negative  Physical Exam: Blood pressure 109/62, pulse 98, temperature 98.9 F (37.2 C), temperature source Oral, resp. rate 20, height 5' (1.524 m), weight 123 lb (55.792 kg), last menstrual period 01/20/2015, SpO2 100 %. BP 109/62 mmHg  Pulse 98  Temp(Src) 98.9 F (37.2 C) (Oral)  Resp 20  Ht 5' (1.524 m)  Wt 123 lb (55.792 kg)  BMI 24.02 kg/m2  SpO2 100%  LMP 01/20/2015 (LMP Unknown)  General Appearance:    Alert, cooperative, mild distress, appears stated age  Head:    Normocephalic, without obvious abnormality, atraumatic  Eyes:    PERRL, conjunctiva/corneas clear, EOM's intact, fundi    benign, both eyes  Ears:    Normal TM's and external ear canals, both ears  Nose:   Nares normal, septum midline, mucosa normal, no drainage    or sinus tenderness  Throat:   Lips, mucosa, and tongue normal; teeth and gums normal  Neck:   Supple, symmetrical, trachea midline, no adenopathy;    thyroid:  no enlargement/tenderness/nodules; no carotid   bruit or JVD  Back:     Symmetric, no curvature, ROM normal, no CVA tenderness  Lungs:     Clear to auscultation bilaterally, respirations unlabored  Chest Wall:    No tenderness or deformity   Heart:    Regular rate and rhythm, S1 and S2 normal, no murmur, rub   or gallop  Abdomen:     Soft, non-tender, bowel sounds active all four quadrants,    no masses, no organomegaly  Extremities:   Extremities normal, atraumatic, no cyanosis or edema  Pulses:   2+ and symmetric all extremities  Skin:   Skin color, texture, turgor normal, no rashes or lesions  Lymph nodes:   Cervical, supraclavicular, and axillary nodes normal  Neurologic:   CNII-XII intact, normal strength, sensation and reflexes    throughout    Lab results: Results for orders placed or performed during the hospital encounter of 01/21/15 (from the past 24 hour(s))  Lactate dehydrogenase     Status:  Abnormal   Collection Time: 01/21/15 11:10 AM  Result Value Ref Range   LDH 276 (H) 98 - 192 U/L  CBC with Differential/Platelet     Status: Abnormal   Collection Time: 01/21/15 11:10 AM  Result Value Ref Range   WBC 11.9 (H) 4.0 - 10.5 K/uL   RBC 2.64 (L) 3.87 - 5.11 MIL/uL   Hemoglobin 9.2 (L) 12.0 - 15.0 g/dL   HCT 13.2 (L) 44.0 - 10.2 %   MCV 101.5 (H) 78.0 - 100.0 fL   MCH 34.8 (H) 26.0 - 34.0 pg   MCHC 34.3 30.0 - 36.0 g/dL   RDW 72.5 (H) 36.6 - 44.0 %   Platelets 287 150 - 400 K/uL   Neutrophils Relative % 84 %   Neutro Abs 9.9 (H) 1.7 - 7.7 K/uL   Lymphocytes Relative 11 %   Lymphs Abs 1.3 0.7 -  4.0 K/uL   Monocytes Relative 5 %   Monocytes Absolute 0.7 0.1 - 1.0 K/uL   Eosinophils Relative 0 %   Eosinophils Absolute 0.0 0.0 - 0.7 K/uL   Basophils Relative 0 %   Basophils Absolute 0.0 0.0 - 0.1 K/uL  Reticulocytes     Status: Abnormal   Collection Time: 01/21/15 11:10 AM  Result Value Ref Range   Retic Ct Pct 8.5 (H) 0.4 - 3.1 %   RBC. 2.64 (L) 3.87 - 5.11 MIL/uL   Retic Count, Manual 224.4 (H) 19.0 - 186.0 K/uL  Comprehensive metabolic panel     Status: Abnormal   Collection Time: 01/21/15 11:10 AM  Result Value Ref Range   Sodium 139 135 - 145 mmol/L   Potassium 3.9 3.5 - 5.1 mmol/L   Chloride 112 (H) 101 - 111 mmol/L   CO2 20 (L) 22 - 32 mmol/L   Glucose, Bld 98 65 - 99 mg/dL   BUN 12 6 - 20 mg/dL   Creatinine, Ser 5.62 0.44 - 1.00 mg/dL   Calcium 9.1 8.9 - 13.0 mg/dL   Total Protein 8.1 6.5 - 8.1 g/dL   Albumin 4.3 3.5 - 5.0 g/dL   AST 53 (H) 15 - 41 U/L   ALT 32 14 - 54 U/L   Alkaline Phosphatase 107 38 - 126 U/L   Total Bilirubin 1.7 (H) 0.3 - 1.2 mg/dL   GFR calc non Af Amer >60 >60 mL/min   GFR calc Af Amer >60 >60 mL/min   Anion gap 7 5 - 15    Imaging results:  No results found.   Assessment & Plan:  Patient will be admitted to the day infusion center for extended observation  Start IV D5.45 for cellular rehydration at 125/hr  Start  Toradol 30 mg IV every 6 hours for inflammation.  Start Dilaudid PCA High Concentration per weight based protocol.   Patient will be re-evaluated for pain intensity in the context of function and relationship to baseline as care progresses.  If no significant pain relief, will transfer patient to inpatient services for a higher level of care.   Reviewed labs from 01/21/2015  [name],Lachina M 01/22/2015, 10:16 AM

## 2015-01-22 NOTE — Progress Notes (Signed)
Patient ID: Brittany Foster, female   DOB: 06/11/1985, 29 y.o.   MRN: [id number] Discharge instructions given to patient, port flushed and de-accessed per protocol.  Patient states pain has improved some and she can manage at home.

## 2015-01-22 NOTE — Discharge Instructions (Signed)
Sickle Cell Anemia, Adult Sickle cell anemia is a condition in which red blood cells have an abnormal "sickle" shape. This abnormal shape shortens the cells' life span, which results in a lower than normal concentration of red blood cells in the blood. The sickle shape also causes the cells to clump together and block free blood flow through the blood vessels. As a result, the tissues and organs of the body do not receive enough oxygen. Sickle cell anemia causes organ damage and pain and increases the risk of infection. CAUSES  Sickle cell anemia is a genetic disorder. Those who receive two copies of the gene have the condition, and those who receive one copy have the trait. RISK FACTORS The sickle cell gene is most common in people whose families originated in [place]. Other areas of the globe where sickle cell trait occurs include the Mediterranean, [place] and [place], the [place], and the [place].  SIGNS AND SYMPTOMS  Pain, especially in the extremities, back, chest, or abdomen (common). The pain may start suddenly or may develop following an illness, especially if there is dehydration. Pain can also occur due to overexertion or exposure to extreme temperature changes.  Frequent severe bacterial infections, especially certain types of pneumonia and meningitis.  Pain and swelling in the hands and feet.  Decreased activity.   Loss of appetite.   Change in behavior.  Headaches.  Seizures.  Shortness of breath or difficulty breathing.  Vision changes.  Skin ulcers. Those with the trait may not have symptoms or they may have mild symptoms.  DIAGNOSIS  Sickle cell anemia is diagnosed with blood tests that demonstrate the genetic trait. It is often diagnosed during the newborn period, due to mandatory testing nationwide. A variety of blood tests, X-rays, CT scans, MRI scans, ultrasounds, and lung function tests may also be done to monitor the condition. TREATMENT  Sickle  cell anemia may be treated with:  Medicines. You may be given pain medicines, antibiotic medicines (to treat and prevent infections) or medicines to increase the production of certain types of hemoglobin.  Fluids.  Oxygen.  Blood transfusions. HOME CARE INSTRUCTIONS   Drink enough fluid to keep your urine clear or pale yellow. Increase your fluid intake in hot weather and during exercise.  Do not smoke. Smoking lowers oxygen levels in the blood.   Only take over-the-counter or prescription medicines for pain, fever, or discomfort as directed by your health care provider.  Take antibiotics as directed by your health care provider. Make sure you finish them it even if you start to feel better.   Take supplements as directed by your health care provider.   Consider wearing a medical alert bracelet. This tells anyone caring for you in an emergency of your condition.   When traveling, keep your medical information, health care provider's names, and the medicines you take with you at all times.   If you develop a fever, do not take medicines to reduce the fever right away. This could cover up a problem that is developing. Notify your health care provider.  Keep all follow-up appointments with your health care provider. Sickle cell anemia requires regular medical care. SEEK MEDICAL CARE IF: You have a fever. SEEK IMMEDIATE MEDICAL CARE IF:   You feel dizzy or faint.   You have new abdominal pain, especially on the left side near the stomach area.   You develop a persistent, often uncomfortable and painful penile erection (priapism). If this is not treated immediately it   will lead to impotence.   You have numbness your arms or legs or you have a hard time moving them.   You have a hard time with speech.   You have a fever or persistent symptoms for more than 2-3 days.   You have a fever and your symptoms suddenly get worse.   You have signs or symptoms of infection.  These include:   Chills.   Abnormal tiredness (lethargy).   Irritability.   Poor eating.   Vomiting.   You develop pain that is not helped with medicine.   You develop shortness of breath.  You have pain in your chest.   You are coughing up pus-like or bloody sputum.   You develop a stiff neck.  Your feet or hands swell or have pain.  Your abdomen appears bloated.  You develop joint pain. MAKE SURE YOU:  Understand these instructions.   This information is not intended to replace advice given to you by your health care provider. Make sure you discuss any questions you have with your health care provider.   Document Released: 05/27/2005 Document Revised: 03/09/2014 Document Reviewed: 09/28/2012 Elsevier Interactive Patient Education 2016 [employer]  

## 2015-01-22 NOTE — Telephone Encounter (Signed)
Patient C/O generalized pain to arms, back, and legs that is 8/10.  Patient states she has taken her home medication without improvement. Spoke with Armeniahina and [facility]  Patient verbalizes understanding.

## 2015-02-04 ENCOUNTER — Telehealth: Payer: Self-pay | Admitting: Internal Medicine

## 2015-02-04 NOTE — Telephone Encounter (Signed)
Refill request oxycodone 10/325mg . LOV 11/0/2016. Please advise. Thanks!

## 2015-02-05 ENCOUNTER — Other Ambulatory Visit: Payer: Self-pay | Admitting: Internal Medicine

## 2015-02-05 DIAGNOSIS — D571 Sickle-cell disease without crisis: Secondary | ICD-10-CM

## 2015-02-05 MED ORDER — OXYCODONE-ACETAMINOPHEN 10-325 MG PO TABS: 1.0000 | ORAL_TABLET | Freq: Four times a day (QID) | ORAL | Status: DC | PRN

## 2015-02-11 ENCOUNTER — Encounter (HOSPITAL_COMMUNITY): Payer: Self-pay

## 2015-02-11 ENCOUNTER — Non-Acute Institutional Stay (HOSPITAL_COMMUNITY)
Admission: AD | Admit: 2015-02-11 | Discharge: 2015-02-11 | Disposition: A | Discharge status: Home / Self Care | Payer: Medicaid Other | Attending: Internal Medicine | Admitting: Internal Medicine

## 2015-02-11 ENCOUNTER — Telehealth (HOSPITAL_COMMUNITY): Payer: Self-pay | Admitting: Internal Medicine

## 2015-02-11 DIAGNOSIS — Z79899 Other long term (current) drug therapy: Secondary | ICD-10-CM | POA: Diagnosis not present

## 2015-02-11 DIAGNOSIS — Z79891 Long term (current) use of opiate analgesic: Secondary | ICD-10-CM | POA: Diagnosis not present

## 2015-02-11 DIAGNOSIS — D5700 Hb-SS disease with crisis, unspecified: Secondary | ICD-10-CM | POA: Diagnosis not present

## 2015-02-11 DIAGNOSIS — Z87891 Personal history of nicotine dependence: Secondary | ICD-10-CM | POA: Diagnosis not present

## 2015-02-11 DIAGNOSIS — M545 Low back pain: Secondary | ICD-10-CM | POA: Diagnosis present

## 2015-02-11 LAB — LACTATE DEHYDROGENASE: LDH: 255 U/L — ABNORMAL HIGH (ref 98–192)

## 2015-02-11 LAB — CBC WITH DIFFERENTIAL/PLATELET
BASOS PCT: 1 %
Basophils Absolute: 0.1 10*3/uL (ref 0.0–0.1)
EOS ABS: 0.2 10*3/uL (ref 0.0–0.7)
Eosinophils Relative: 1 %
HCT: 27.8 % — ABNORMAL LOW (ref 36.0–46.0)
HEMOGLOBIN: 9.6 g/dL — AB (ref 12.0–15.0)
Lymphocytes Relative: 16 %
Lymphs Abs: 2.7 10*3/uL (ref 0.7–4.0)
MCH: 34.7 pg — ABNORMAL HIGH (ref 26.0–34.0)
MCHC: 34.5 g/dL (ref 30.0–36.0)
MCV: 100.4 fL — ABNORMAL HIGH (ref 78.0–100.0)
MONOS PCT: 7 %
Monocytes Absolute: 1.2 10*3/uL — ABNORMAL HIGH (ref 0.1–1.0)
NEUTROS PCT: 75 %
Neutro Abs: 12.5 10*3/uL — ABNORMAL HIGH (ref 1.7–7.7)
PLATELETS: 204 10*3/uL (ref 150–400)
RBC: 2.77 MIL/uL — AB (ref 3.87–5.11)
RDW: 16 % — ABNORMAL HIGH (ref 11.5–15.5)
WBC: 16.7 10*3/uL — AB (ref 4.0–10.5)

## 2015-02-11 LAB — COMPREHENSIVE METABOLIC PANEL
ALBUMIN: 4.7 g/dL (ref 3.5–5.0)
ALK PHOS: 113 U/L (ref 38–126)
ALT: 36 U/L (ref 14–54)
ANION GAP: 7 (ref 5–15)
AST: 59 U/L — ABNORMAL HIGH (ref 15–41)
BUN: 9 mg/dL (ref 6–20)
CALCIUM: 9.4 mg/dL (ref 8.9–10.3)
CHLORIDE: 112 mmol/L — AB (ref 101–111)
CO2: 20 mmol/L — AB (ref 22–32)
Creatinine, Ser: 0.51 mg/dL (ref 0.44–1.00)
GFR calc Af Amer: >60 mL/min (ref >60)
GFR calc non Af Amer: >60 mL/min (ref >60)
GLUCOSE: 103 mg/dL — AB (ref 65–99)
Potassium: 3.8 mmol/L (ref 3.5–5.1)
SODIUM: 139 mmol/L (ref 135–145)
Total Bilirubin: 1.7 mg/dL — ABNORMAL HIGH (ref 0.3–1.2)
Total Protein: 8.5 g/dL — ABNORMAL HIGH (ref 6.5–8.1)

## 2015-02-11 LAB — RETICULOCYTES
RBC.: 2.77 MIL/uL — AB (ref 3.87–5.11)
RETIC COUNT ABSOLUTE: 85.9 10*3/uL (ref 19.0–186.0)
Retic Ct Pct: 3.1 % (ref 0.4–3.1)

## 2015-02-11 LAB — URINALYSIS, ROUTINE W REFLEX MICROSCOPIC
BILIRUBIN URINE: NEGATIVE
Glucose, UA: NEGATIVE mg/dL
HGB URINE DIPSTICK: NEGATIVE
KETONES UR: NEGATIVE mg/dL
Leukocytes, UA: NEGATIVE
NITRITE: NEGATIVE
PROTEIN: NEGATIVE mg/dL
SPECIFIC GRAVITY, URINE: 1.013 (ref 1.005–1.030)
pH: 6 (ref 5.0–8.0)

## 2015-02-11 LAB — PREGNANCY, URINE: PREG TEST UR: NEGATIVE

## 2015-02-11 MED ORDER — OXYCODONE HCL 5 MG PO TABS
5.0000 mg | ORAL_TABLET | Freq: Once | ORAL | Status: AC
  Administered 2015-02-11: 5 mg via ORAL
  Filled 2015-02-11: qty 1

## 2015-02-11 MED ORDER — DIPHENHYDRAMINE HCL 12.5 MG/5ML PO ELIX
12.5000 mg | ORAL_SOLUTION | Freq: Four times a day (QID) | ORAL | Status: DC | PRN
  Administered 2015-02-11: 12.5 mg via ORAL
  Filled 2015-02-11: qty 5

## 2015-02-11 MED ORDER — HEPARIN SOD (PORK) LOCK FLUSH 100 UNIT/ML IV SOLN
500.0000 [IU] | INTRAVENOUS | Status: AC | PRN
  Administered 2015-02-11: 500 [IU]
  Filled 2015-02-11 (×2): qty 5

## 2015-02-11 MED ORDER — ONDANSETRON HCL 4 MG/2ML IJ SOLN: 4.0000 mg | Freq: Four times a day (QID) | INTRAMUSCULAR | Status: DC | PRN

## 2015-02-11 MED ORDER — OXYCODONE-ACETAMINOPHEN 5-325 MG PO TABS
1.0000 | ORAL_TABLET | Freq: Once | ORAL | Status: AC
  Administered 2015-02-11: 1 via ORAL
  Filled 2015-02-11: qty 1

## 2015-02-11 MED ORDER — DEXTROSE-NACL 5-0.45 % IV SOLN
INTRAVENOUS | Status: DC
  Administered 2015-02-11: 11:00:00 via INTRAVENOUS

## 2015-02-11 MED ORDER — KETOROLAC TROMETHAMINE 30 MG/ML IJ SOLN
15.0000 mg | Freq: Once | INTRAMUSCULAR | Status: AC
  Administered 2015-02-11: 15 mg via INTRAVENOUS
  Filled 2015-02-11: qty 1

## 2015-02-11 MED ORDER — SODIUM CHLORIDE 0.9 % IJ SOLN
10.0000 mL | INTRAMUSCULAR | Status: AC | PRN
  Administered 2015-02-11: 10 mL

## 2015-02-11 MED ORDER — SODIUM CHLORIDE 0.9 % IV SOLN
12.5000 mg | Freq: Four times a day (QID) | INTRAVENOUS | Status: DC | PRN
  Administered 2015-02-11: 12.5 mg via INTRAVENOUS
  Filled 2015-02-11 (×3): qty 0.25

## 2015-02-11 MED ORDER — NALOXONE HCL 0.4 MG/ML IJ SOLN: 0.4000 mg | INTRAMUSCULAR | Status: DC | PRN

## 2015-02-11 MED ORDER — HYDROMORPHONE 1 MG/ML IV SOLN
INTRAVENOUS | Status: DC
  Administered 2015-02-11: 17.5 mg via INTRAVENOUS
  Administered 2015-02-11: 11:00:00 via INTRAVENOUS
  Filled 2015-02-11: qty 25

## 2015-02-11 MED ORDER — SODIUM CHLORIDE 0.9 % IJ SOLN: 9.0000 mL | INTRAMUSCULAR | Status: DC | PRN

## 2015-02-11 MED ORDER — KETOROLAC TROMETHAMINE 30 MG/ML IJ SOLN: 30.0000 mg | Freq: Once | INTRAMUSCULAR | Status: DC

## 2015-02-11 NOTE — Progress Notes (Signed)
[facility]. Pt stated pain # was 10 on admission but has come down to 6-7 at discharge. Pt was treated with IV fluids, Toradol and Dilaudid PCA. Pt also received her prescription of Percocet at discharge. Pt voiced understanding of discharge instructions. Pt discharged to home alert, oriented and ambulatory.

## 2015-02-11 NOTE — Telephone Encounter (Signed)
Notified patient that, per NP, she may come to the day hospital for evaluation; pt verbalizes understanding

## 2015-02-11 NOTE — Telephone Encounter (Signed)
Pt called states experiencing pain in legs, hips, and legs; states took home pain medications with no relief; pt denies chest pain, shortness of breath, diarrhea, and vomiting; pt states experiencing a little nausea; pt rates pain 8/10; NP notified

## 2015-02-11 NOTE — H&P (Signed)
[facility] History and Physical   Date: 02/11/2015  Patient name: Brittany Foster Medical record number: [id number] Date of birth: [birth year] Age: 29 y.o. Gender: female PCP: Jeanann Lewandowsky, MD  Attending physician: Quentin Angst, MD  Chief Complaint: Left rib and low back pain  History of Present Illness:  Brittany Foster, a 29 year old female with a history of sickle cell anemia, HbSS presents with left rib and low back. Patient attributes current pain crisis to changes in weather. She describes current pain intensity as constant and sharp. She states that pain intensity increased over the weekend. Current pain intensity is 9/10. She states that she has been taking long acting took long acting Belbuca, percocet 10-325 mg, and a hot bath with minimal relief.  She maintains that she is taking all other medications consistently. Patient denies fatigue, headache, shortness of breath, urinary problems, nausea, vomiting, or diarrhea.   Meds: Prescriptions prior to admission  Medication Sig Dispense Refill Last Dose  . Buprenorphine HCl (BELBUCA) 75 MCG FILM Place 75 mcg inside cheek 2 (two) times daily. 60 each 0 01/22/2015 at Unknown time  . Cholecalciferol (VITAMIN D3) 5000 UNITS CAPS Take 1 capsule (5,000 Units total) by mouth daily. 30 capsule 5 Past Month at Unknown time  . DULoxetine (CYMBALTA) 60 MG capsule Take 1 capsule (60 mg total) by mouth daily. 30 capsule 3 01/22/2015 at Unknown time  . folic acid (FOLVITE) 1 MG tablet Take 1 tablet (1 mg total) by mouth daily. 90 tablet 11 01/22/2015 at Unknown time  . gabapentin (NEURONTIN) 300 MG capsule Take 1 capsule (300 mg total) by mouth 3 (three) times daily. 90 capsule 2 01/22/2015 at Unknown time  . hydroxyurea (HYDREA) 500 MG capsule Take 2 capsules (1,000 mg total) by mouth daily. May take with food to minimize GI side effects. 60 capsule 3 01/22/2015 at Unknown time  . oxyCODONE-acetaminophen (PERCOCET)  10-325 MG tablet Take 1 tablet by mouth every 6 (six) hours as needed for pain. No Refill before 02/07/2015 120 tablet 0   . potassium chloride SA (K-DUR,KLOR-CON) 20 MEQ tablet Take 1 tablet (20 mEq total) by mouth daily. 30 tablet 2 01/22/2015 at Unknown time  . Topiramate ER 100 MG CP24 Take 100 mg by mouth at bedtime.   01/21/2015 at Unknown time    Allergies: Ultram and Morphine and related Past Medical History  Diagnosis Date  . Sickle cell anemia [facility])    Past Surgical History  Procedure Laterality Date  . Tubal ligation    . Cholecystectomy    . Cesarean section    . Port a cath placement Right     about 6-7 years ago  . Cholecystectomy  2000   Family History  Problem Relation Age of Onset  . Sickle cell anemia Other   . Sickle cell trait Father   . Sickle cell trait Mother    Social History   Social History  . Marital Status: Single    Spouse Name: N/A  . Number of Children: N/A  . Years of Education: N/A   Occupational History  . Not on file.   Social History Main Topics  . Smoking status: Former Games developer  . Smokeless tobacco: Not on file     Comment: smokes once every couple of weeks.   . Alcohol Use: No  . Drug Use: No  . Sexual Activity: Yes    Birth Control/ Protection: Surgical   Other Topics Concern  .  Not on file   Social History Narrative    Review of Systems: Constitutional: negative for fatigue and fevers Eyes: negative Ears, nose, mouth, throat, and face: negative Respiratory: negative for cough, dyspnea on exertion and wheezing Cardiovascular: negative for chest pain, fatigue, orthopnea and tachypnea Gastrointestinal: negative for constipation, diarrhea and nausea Genitourinary:negative Integument/breast: negative Hematologic/lymphatic: negative Musculoskeletal:negative for myalgias Neurological: negative Behavioral/Psych: negative Endocrine: negative Allergic/Immunologic: negative  Physical Exam: Blood pressure 107/64, pulse  112, temperature 99.4 F (37.4 C), temperature source Oral, resp. rate 18, last menstrual period 01/20/2015, SpO2 99 %. BP 107/64 mmHg  Pulse 112  Temp(Src) 99.4 F (37.4 C) (Oral)  Resp 18  SpO2 99%  LMP 01/20/2015 (LMP Unknown)  General Appearance:    Alert, cooperative, mild distress, appears stated age  Head:    Normocephalic, without obvious abnormality, atraumatic  Eyes:    PERRL, conjunctiva/corneas clear, EOM's intact, fundi    benign, both eyes  Ears:    Normal TM's and external ear canals, both ears  Nose:   Nares normal, septum midline, mucosa normal, no drainage    or sinus tenderness  Throat:   Lips, mucosa, and tongue normal; teeth and gums normal  Neck:   Supple, symmetrical, trachea midline, no adenopathy;    thyroid:  no enlargement/tenderness/nodules; no carotid   bruit or JVD  Back:     Symmetric, no curvature, ROM normal, no CVA tenderness  Lungs:     Clear to auscultation bilaterally, respirations unlabored  Chest Wall:    No tenderness or deformity   Heart:    Regular rate and rhythm, S1 and S2 normal, no murmur, rub   or gallop  Abdomen:     Soft, non-tender, bowel sounds active all four quadrants,    no masses, no organomegaly  Extremities:   Extremities normal, atraumatic, no cyanosis or edema  Pulses:   2+ and symmetric all extremities  Skin:   Skin color, texture, turgor normal, no rashes or lesions  Lymph nodes:   Cervical, supraclavicular, and axillary nodes normal  Neurologic:   CNII-XII intact, normal strength, sensation and reflexes    throughout    Lab results: No results found for this or any previous visit (from the past 24 hour(s)).  Imaging results:  No results found.   Assessment & Plan:  Patient will be admitted to the day infusion center for extended observation  Start IV D5.45 for cellular rehydration at 100/hr  Start Toradol 30 mg IV every 6 hours for inflammation.  Start Dilaudid PCA High Concentration per weight based  protocol.   Patient will be re-evaluated for pain intensity in the context of function and relationship to baseline as care      progresses.  If no significant pain relief, will transfer patient to inpatient services for a higher level of care.    Shamel Galyean M 02/11/2015, 10:22 AM

## 2015-02-11 NOTE — Discharge Summary (Signed)
[facility] Discharge Summary   Patient ID: Brittany Foster MRN: [id number] DOB/AGE: 03-Mar-1985 29 y.o.  Admit date: 02/11/2015 Discharge date: 02/11/2015  Primary Care Physician:  Jeanann Lewandowsky, MD  Admission Diagnoses:  Active Problems:   Sickle cell anemia with pain (HCC)  Discharge Medications:    Medication List    TAKE these medications        Buprenorphine HCl 75 MCG Film  Commonly known as:  BELBUCA  Place 75 mcg inside cheek 2 (two) times daily.     DULoxetine 60 MG capsule  Commonly known as:  CYMBALTA  Take 1 capsule (60 mg total) by mouth daily.     folic acid 1 MG tablet  Commonly known as:  FOLVITE  Take 1 tablet (1 mg total) by mouth daily.     gabapentin 300 MG capsule  Commonly known as:  NEURONTIN  Take 1 capsule (300 mg total) by mouth 3 (three) times daily.     hydroxyurea 500 MG capsule  Commonly known as:  HYDREA  Take 2 capsules (1,000 mg total) by mouth daily. May take with food to minimize GI side effects.     oxyCODONE-acetaminophen 10-325 MG tablet  Commonly known as:  PERCOCET  Take 1 tablet by mouth every 6 (six) hours as needed for pain. No Refill before 02/07/2015     potassium chloride SA 20 MEQ tablet  Commonly known as:  K-DUR,KLOR-CON  Take 1 tablet (20 mEq total) by mouth daily.     Topiramate ER 100 MG Cp24  Take 100 mg by mouth at bedtime.     Vitamin D3 5000 UNITS Caps  Take 1 capsule (5,000 Units total) by mouth daily.         Consults:  None  Significant Diagnostic Studies:  No results found.   [facility] Course: -Ms. Mazie Fencl was admitted to the day infusion center for extended observation.  Started D5.45 at 125 ml per hour for cellular rehydration.  Patient was started on a high concentration dilaudid PCA. She used a total of 17.5 mg with 36 demands and 35 deliveries. She reports that pain intensity is 6-7/10. She received Percocet 10-325 mg 45 minutes prior  to discharge. Patient can manage at home on current medication regimen.  -She is alert, oriented, and ambulatory.  -Patient is to follow up in office as scheduled with Dr. Hyman Hopes. - Recommend that she continue rest and increase hydration to 64 ounce of water every other hour.    Physical Exam at Discharge: BP 106/68 mmHg  Pulse 104  Temp(Src) 99.4 F (37.4 C) (Oral)  Resp 18  SpO2 98%  LMP 01/20/2015 (LMP Unknown)  General Appearance:    Alert, cooperative, no distress, appears stated age  Head:    Normocephalic, without obvious abnormality, atraumatic  Eyes:    PERRL, conjunctiva/corneas clear, EOM's intact, fundi    benign, both eyes  Back:     Symmetric, no curvature, ROM normal, no CVA tenderness  Lungs:     Clear to auscultation bilaterally, respirations unlabored  Chest Wall:    No tenderness or deformity   Heart:    Regular rate and rhythm, S1 and S2 normal, no murmur, rub   or gallop  Extremities:   Extremities normal, atraumatic, no cyanosis or edema  Pulses:   2+ and symmetric all extremities  Skin:   Skin color, texture, turgor normal, no rashes or lesions  Lymph nodes:  Cervical, supraclavicular, and axillary nodes normal  Neurologic:   CNII-XII intact, normal strength, sensation and reflexes    throughout   Disposition at Discharge: 01-Home or Self Care  Discharge Orders: Discharge Instructions    Discharge patient    Complete by:  As directed            Condition at Discharge:   Stable  Time spent on Discharge:  15 minutes  Signed: Dallon Dacosta M 02/11/2015, 5:07 PM

## 2015-02-14 ENCOUNTER — Telehealth (HOSPITAL_COMMUNITY): Payer: Self-pay | Admitting: *Deleted

## 2015-02-15 ENCOUNTER — Telehealth (HOSPITAL_COMMUNITY): Payer: Self-pay | Admitting: *Deleted

## 2015-02-15 ENCOUNTER — Non-Acute Institutional Stay (HOSPITAL_COMMUNITY)
Admission: AD | Admit: 2015-02-15 | Discharge: 2015-02-15 | Disposition: A | Discharge status: Home / Self Care | Payer: Medicaid Other | Attending: Internal Medicine | Admitting: Internal Medicine

## 2015-02-15 ENCOUNTER — Encounter (HOSPITAL_COMMUNITY): Payer: Self-pay | Admitting: *Deleted

## 2015-02-15 DIAGNOSIS — Z79899 Other long term (current) drug therapy: Secondary | ICD-10-CM | POA: Diagnosis not present

## 2015-02-15 DIAGNOSIS — R0781 Pleurodynia: Secondary | ICD-10-CM | POA: Insufficient documentation

## 2015-02-15 DIAGNOSIS — M545 Low back pain: Secondary | ICD-10-CM | POA: Diagnosis not present

## 2015-02-15 DIAGNOSIS — D571 Sickle-cell disease without crisis: Secondary | ICD-10-CM | POA: Insufficient documentation

## 2015-02-15 DIAGNOSIS — D5700 Hb-SS disease with crisis, unspecified: Secondary | ICD-10-CM | POA: Diagnosis present

## 2015-02-15 DIAGNOSIS — Z9049 Acquired absence of other specified parts of digestive tract: Secondary | ICD-10-CM | POA: Insufficient documentation

## 2015-02-15 DIAGNOSIS — Z87891 Personal history of nicotine dependence: Secondary | ICD-10-CM | POA: Diagnosis not present

## 2015-02-15 MED ORDER — ONDANSETRON HCL 4 MG/2ML IJ SOLN
4.0000 mg | Freq: Four times a day (QID) | INTRAMUSCULAR | Status: DC | PRN
  Administered 2015-02-15: 4 mg via INTRAVENOUS
  Filled 2015-02-15: qty 2

## 2015-02-15 MED ORDER — HYDROMORPHONE 1 MG/ML IV SOLN
INTRAVENOUS | Status: DC
  Administered 2015-02-15: 11:00:00 via INTRAVENOUS
  Administered 2015-02-15: 15 mg via INTRAVENOUS
  Filled 2015-02-15: qty 25

## 2015-02-15 MED ORDER — HEPARIN SOD (PORK) LOCK FLUSH 100 UNIT/ML IV SOLN
500.0000 [IU] | INTRAVENOUS | Status: AC | PRN
  Administered 2015-02-15: 500 [IU]
  Filled 2015-02-15: qty 5

## 2015-02-15 MED ORDER — KETOROLAC TROMETHAMINE 30 MG/ML IJ SOLN
15.0000 mg | Freq: Once | INTRAMUSCULAR | Status: AC
  Administered 2015-02-15: 15 mg via INTRAVENOUS
  Filled 2015-02-15: qty 1

## 2015-02-15 MED ORDER — SODIUM CHLORIDE 0.9 % IJ SOLN: 9.0000 mL | INTRAMUSCULAR | Status: DC | PRN

## 2015-02-15 MED ORDER — SODIUM CHLORIDE 0.9 % IV SOLN
12.5000 mg | Freq: Four times a day (QID) | INTRAVENOUS | Status: DC | PRN
  Administered 2015-02-15: 12.5 mg via INTRAVENOUS
  Filled 2015-02-15 (×3): qty 0.25

## 2015-02-15 MED ORDER — DEXTROSE-NACL 5-0.45 % IV SOLN
INTRAVENOUS | Status: DC
  Administered 2015-02-15: 11:00:00 via INTRAVENOUS

## 2015-02-15 MED ORDER — OXYCODONE HCL 5 MG PO TABS
5.0000 mg | ORAL_TABLET | Freq: Once | ORAL | Status: AC
  Administered 2015-02-15: 5 mg via ORAL
  Filled 2015-02-15: qty 1

## 2015-02-15 MED ORDER — OXYCODONE-ACETAMINOPHEN 5-325 MG PO TABS
1.0000 | ORAL_TABLET | Freq: Once | ORAL | Status: AC
  Administered 2015-02-15: 1 via ORAL
  Filled 2015-02-15: qty 1

## 2015-02-15 MED ORDER — DIPHENHYDRAMINE HCL 12.5 MG/5ML PO ELIX: 12.5000 mg | ORAL_SOLUTION | Freq: Four times a day (QID) | ORAL | Status: DC | PRN

## 2015-02-15 MED ORDER — NALOXONE HCL 0.4 MG/ML IJ SOLN: 0.4000 mg | INTRAMUSCULAR | Status: DC | PRN

## 2015-02-15 MED ORDER — SODIUM CHLORIDE 0.9 % IJ SOLN
10.0000 mL | INTRAMUSCULAR | Status: AC | PRN
  Administered 2015-02-15: 10 mL

## 2015-02-15 NOTE — Discharge Summary (Signed)
[facility] Discharge Summary   Patient ID: Brittany Foster MRN: [id number] DOB/AGE: [birth year] 29 y.o.  Admit date: 02/15/2015 Discharge date: 02/15/2015  Primary Care Physician:  Jeanann Lewandowsky, MD  Admission Diagnoses:  Active Problems:   Sickle cell anemia with pain [facility])  Discharge Medications:    Medication List    ASK your doctor about these medications        Buprenorphine HCl 75 MCG Film  Commonly known as:  BELBUCA  Place 75 mcg inside cheek 2 (two) times daily.     DULoxetine 60 MG capsule  Commonly known as:  CYMBALTA  Take 1 capsule (60 mg total) by mouth daily.     folic acid 1 MG tablet  Commonly known as:  FOLVITE  Take 1 tablet (1 mg total) by mouth daily.     gabapentin 300 MG capsule  Commonly known as:  NEURONTIN  Take 1 capsule (300 mg total) by mouth 3 (three) times daily.     hydroxyurea 500 MG capsule  Commonly known as:  HYDREA  Take 2 capsules (1,000 mg total) by mouth daily. May take with food to minimize GI side effects.     oxyCODONE-acetaminophen 10-325 MG tablet  Commonly known as:  PERCOCET  Take 1 tablet by mouth every 6 (six) hours as needed for pain. No Refill before 02/07/2015     potassium chloride SA 20 MEQ tablet  Commonly known as:  K-DUR,KLOR-CON  Take 1 tablet (20 mEq total) by mouth daily.     Topiramate ER 100 MG Cp24  Take 100 mg by mouth at bedtime.     Vitamin D3 5000 UNITS Caps  Take 1 capsule (5,000 Units total) by mouth daily.         Consults:  None  Significant Diagnostic Studies:  No results found.   [facility] Course: -[name] was admitted to the day infusion center for extended observation.  Started D5.45 at 125 ml per hour for cellular rehydration.  Patient was started on a high concentration dilaudid PCA. She used a total of 15 mg with 31 demands and 30 deliveries. She reports that pain intensity is  7/10. She received Percocet 10-325 mg  30 minutes prior to discharge. Patient can manage at home on current medication regimen.  -She is alert, oriented, and ambulatory.  -Patient is to follow up in office as scheduled with Dr. Hyman Hopes. - Recommend that she continue rest and increase hydration to 64 ounce of water every other hour.    Physical Exam at Discharge: BP 97/63 mmHg  Pulse 85  Temp(Src) 98.3 F (36.8 C) (Oral)  Resp 14  Ht 5' (1.524 m)  Wt 113 lb (51.256 kg)  BMI 22.07 kg/m2  SpO2 9%  LMP 02/15/2015  General Appearance:    Alert, cooperative, no distress, appears stated age  Head:    Normocephalic, without obvious abnormality, atraumatic  Eyes:    PERRL, conjunctiva/corneas clear, EOM's intact, fundi    benign, both eyes  Back:     Symmetric, no curvature, ROM normal, no CVA tenderness  Lungs:     Clear to auscultation bilaterally, respirations unlabored  Chest Wall:    No tenderness or deformity   Heart:    Regular rate and rhythm, S1 and S2 normal, no murmur, rub   or gallop  Extremities:   Extremities normal, atraumatic, no cyanosis or edema  Pulses:   2+ and symmetric all extremities  Skin:   Skin color, texture, turgor normal, no rashes or lesions  Lymph nodes:   Cervical, supraclavicular, and axillary nodes normal  Neurologic:   CNII-XII intact, normal strength, sensation and reflexes    throughout   Disposition at Discharge: 01-Home or Self Care  Discharge Orders:   Condition at Discharge:   Stable  Time spent on Discharge:  15 minutes  Signed: Jack Mineau M 02/15/2015, 4:22 PM

## 2015-02-15 NOTE — Discharge Instructions (Signed)
Sickle Cell Anemia, Adult Sickle cell anemia is a condition in which red blood cells have an abnormal "sickle" shape. This abnormal shape shortens the cells' life span, which results in a lower than normal concentration of red blood cells in the blood. The sickle shape also causes the cells to clump together and block free blood flow through the blood vessels. As a result, the tissues and organs of the body do not receive enough oxygen. Sickle cell anemia causes organ damage and pain and increases the risk of infection. CAUSES  Sickle cell anemia is a genetic disorder. Those who receive two copies of the gene have the condition, and those who receive one copy have the trait. RISK FACTORS The sickle cell gene is most common in people whose families originated in [place]. Other areas of the globe where sickle cell trait occurs include the Mediterranean, [place] and [place], the [place], and the [place].  SIGNS AND SYMPTOMS  Pain, especially in the extremities, back, chest, or abdomen (common). The pain may start suddenly or may develop following an illness, especially if there is dehydration. Pain can also occur due to overexertion or exposure to extreme temperature changes.  Frequent severe bacterial infections, especially certain types of pneumonia and meningitis.  Pain and swelling in the hands and feet.  Decreased activity.   Loss of appetite.   Change in behavior.  Headaches.  Seizures.  Shortness of breath or difficulty breathing.  Vision changes.  Skin ulcers. Those with the trait may not have symptoms or they may have mild symptoms.  DIAGNOSIS  Sickle cell anemia is diagnosed with blood tests that demonstrate the genetic trait. It is often diagnosed during the newborn period, due to mandatory testing nationwide. A variety of blood tests, X-rays, CT scans, MRI scans, ultrasounds, and lung function tests may also be done to monitor the condition. TREATMENT  Sickle  cell anemia may be treated with:  Medicines. You may be given pain medicines, antibiotic medicines (to treat and prevent infections) or medicines to increase the production of certain types of hemoglobin.  Fluids.  Oxygen.  Blood transfusions. HOME CARE INSTRUCTIONS   Drink enough fluid to keep your urine clear or pale yellow. Increase your fluid intake in hot weather and during exercise.  Do not smoke. Smoking lowers oxygen levels in the blood.   Only take over-the-counter or prescription medicines for pain, fever, or discomfort as directed by your health care provider.  Take antibiotics as directed by your health care provider. Make sure you finish them it even if you start to feel better.   Take supplements as directed by your health care provider.   Consider wearing a medical alert bracelet. This tells anyone caring for you in an emergency of your condition.   When traveling, keep your medical information, health care provider's names, and the medicines you take with you at all times.   If you develop a fever, do not take medicines to reduce the fever right away. This could cover up a problem that is developing. Notify your health care provider.  Keep all follow-up appointments with your health care provider. Sickle cell anemia requires regular medical care. SEEK MEDICAL CARE IF: You have a fever. SEEK IMMEDIATE MEDICAL CARE IF:   You feel dizzy or faint.   You have new abdominal pain, especially on the left side near the stomach area.   You develop a persistent, often uncomfortable and painful penile erection (priapism). If this is not treated immediately it   will lead to impotence.   You have numbness your arms or legs or you have a hard time moving them.   You have a hard time with speech.   You have a fever or persistent symptoms for more than 2-3 days.   You have a fever and your symptoms suddenly get worse.   You have signs or symptoms of infection.  These include:   Chills.   Abnormal tiredness (lethargy).   Irritability.   Poor eating.   Vomiting.   You develop pain that is not helped with medicine.   You develop shortness of breath.  You have pain in your chest.   You are coughing up pus-like or bloody sputum.   You develop a stiff neck.  Your feet or hands swell or have pain.  Your abdomen appears bloated.  You develop joint pain. MAKE SURE YOU:  Understand these instructions.   This information is not intended to replace advice given to you by your health care provider. Make sure you discuss any questions you have with your health care provider.   Document Released: 05/27/2005 Document Revised: 03/09/2014 Document Reviewed: 09/28/2012 Elsevier Interactive Patient Education 2016 [employer]  

## 2015-02-15 NOTE — Progress Notes (Addendum)
[facility]. Pt rated pain 8/10 on admission but has come down to 7 at discharge. Pt was treated with IV fluids, Toradol and Dilaudid PCA. Pt verbalized understanding of dc instructions; ambulatory to d/c, A/Ox4, in no apparent distress. [name], [name], RN

## 2015-02-15 NOTE — H&P (Signed)
[facility] History and Physical   Date: 02/15/2015  Patient name: Brittany Foster Medical record number: [id number] Date of birth: 04/23/1985 Age: 29 y.o. Gender: female PCP: Jeanann LewandowskyJEGEDE, OLUGBEMIGA, MD  Attending physician: Quentin Angstlugbemiga E Jegede, MD  Chief Complaint: Left rib and low back pain  History of Present Illness:  Brittany CampbellMiranda Filion, a 29 year old female with a history of sickle cell anemia, HbSS presents with left rib and low back, which is consistent with typical sickle cell pain. . Patient attributes current pain crisis to changes in weather.She was evaluated in the day infusion center on 02/11/2015.   She describes current pain intensity as constant and sharp. She states that pain intensity increased over the past few days. Current pain intensity is 9/10. She states that she has been taking long acting Belbuca, percocet 10-325 mg, with unsatisfactory relief.  She maintains that she is taking all other medications consistently. Patient denies fatigue, headache, shortness of breath, dysuria, nausea, vomiting, or diarrhea.   Meds: Prescriptions prior to admission  Medication Sig Dispense Refill Last Dose  . Buprenorphine HCl (BELBUCA) 75 MCG FILM Place 75 mcg inside cheek 2 (two) times daily. 60 each 0 02/15/2015 at Unknown time  . Cholecalciferol (VITAMIN D3) 5000 UNITS CAPS Take 1 capsule (5,000 Units total) by mouth daily. 30 capsule 5 02/14/2015 at Unknown time  . DULoxetine (CYMBALTA) 60 MG capsule Take 1 capsule (60 mg total) by mouth daily. 30 capsule 3 02/14/2015 at Unknown time  . folic acid (FOLVITE) 1 MG tablet Take 1 tablet (1 mg total) by mouth daily. 90 tablet 11 02/14/2015 at Unknown time  . gabapentin (NEURONTIN) 300 MG capsule Take 1 capsule (300 mg total) by mouth 3 (three) times daily. 90 capsule 2 02/14/2015 at Unknown time  . hydroxyurea (HYDREA) 500 MG capsule Take 2 capsules (1,000 mg total) by mouth daily. May take with food to minimize GI side  effects. 60 capsule 3 02/14/2015 at Unknown time  . oxyCODONE-acetaminophen (PERCOCET) 10-325 MG tablet Take 1 tablet by mouth every 6 (six) hours as needed for pain. No Refill before 02/07/2015 120 tablet 0 02/15/2015 at Unknown time  . potassium chloride SA (K-DUR,KLOR-CON) 20 MEQ tablet Take 1 tablet (20 mEq total) by mouth daily. 30 tablet 2 02/14/2015 at Unknown time  . Topiramate ER 100 MG CP24 Take 100 mg by mouth at bedtime.   02/14/2015 at Unknown time    Allergies: Ultram and Morphine and related Past Medical History  Diagnosis Date  . Sickle cell anemia [facility](HCC)    Past Surgical History  Procedure Laterality Date  . Tubal ligation    . Cholecystectomy    . Cesarean section    . Port a cath placement Right     about 6-7 years ago  . Cholecystectomy  2000   Family History  Problem Relation Age of Onset  . Sickle cell anemia Other   . Sickle cell trait Father   . Sickle cell trait Mother    Social History   Social History  . Marital Status: Single    Spouse Name: N/A  . Number of Children: N/A  . Years of Education: N/A   Occupational History  . Not on file.   Social History Main Topics  . Smoking status: Former Games developermoker  . Smokeless tobacco: Not on file     Comment: smokes once every couple of weeks.   . Alcohol Use: No  . Drug Use: No  .  Sexual Activity: Yes    Birth Control/ Protection: Surgical   Other Topics Concern  . Not on file   Social History Narrative    Review of Systems: Constitutional: negative for fatigue and fevers Eyes: negative Ears, nose, mouth, throat, and face: negative Respiratory: negative for cough, dyspnea on exertion and wheezing Cardiovascular: negative for chest pain, fatigue, orthopnea and tachypnea Gastrointestinal: negative for constipation, diarrhea and nausea Genitourinary:negative Integument/breast: negative Hematologic/lymphatic: negative Musculoskeletal:negative for myalgias Neurological:  negative Behavioral/Psych: negative Endocrine: negative Allergic/Immunologic: negative  Physical Exam: Blood pressure 120/85, pulse 102, temperature 98.5 F (36.9 C), resp. rate 16, height 5' (1.524 m), weight 113 lb (51.256 kg), last menstrual period 02/15/2015, SpO2 100 %. BP 120/85 mmHg  Pulse 102  Temp(Src) 98.5 F (36.9 C)  Resp 16  Ht 5' (1.524 m)  Wt 113 lb (51.256 kg)  BMI 22.07 kg/m2  SpO2 100%  LMP 02/15/2015  General Appearance:    Alert, cooperative, mild distress, appears stated age  Head:    Normocephalic, without obvious abnormality, atraumatic  Eyes:    PERRL, conjunctiva/corneas clear, EOM's intact, fundi    benign, both eyes  Ears:    Normal TM's and external ear canals, both ears  Nose:   Nares normal, septum midline, mucosa normal, no drainage    or sinus tenderness  Throat:   Lips, mucosa, and tongue normal; teeth and gums normal  Neck:   Supple, symmetrical, trachea midline, no adenopathy;    thyroid:  no enlargement/tenderness/nodules; no carotid   bruit or JVD  Back:     Symmetric, no curvature, ROM normal, no CVA tenderness  Lungs:     Clear to auscultation bilaterally, respirations unlabored  Chest Wall:    No tenderness or deformity   Heart:    Regular rate and rhythm, S1 and S2 normal, no murmur, rub   or gallop  Abdomen:     Soft, non-tender, bowel sounds active all four quadrants,    no masses, no organomegaly  Extremities:   Extremities normal, atraumatic, no cyanosis or edema  Pulses:   2+ and symmetric all extremities  Skin:   Skin color, texture, turgor normal, no rashes or lesions  Lymph nodes:   Cervical, supraclavicular, and axillary nodes normal  Neurologic:   CNII-XII intact, normal strength, sensation and reflexes    throughout    Lab results: No results found for this or any previous visit (from the past 24 hour(s)).  Imaging results:  No results found.   Assessment & Plan:  Patient will be admitted to the day infusion  center for extended observation  Start IV D5.45 for cellular rehydration at 125/hr  Start Toradol 30 mg IV every times one for inflammation.  Start Dilaudid PCA High Concentration per weight based protocol.   Patient will be re-evaluated for pain intensity in the context of function throughout observation  If no significant pain relief, will transfer patient to inpatient services for a higher level of care.    Harvest Deist M 02/15/2015, 11:02 AM

## 2015-02-18 ENCOUNTER — Telehealth (HOSPITAL_COMMUNITY): Payer: Self-pay | Admitting: Internal Medicine

## 2015-02-18 NOTE — Telephone Encounter (Signed)
[facility]

## 2015-02-18 NOTE — Telephone Encounter (Signed)
Pt called and states experiencing pain in rib cage and hips; rates pain 8/10; [facility]; pt denies chest pain, shortness of breath, nausea, vomiting, or diarrhea; NP notified

## 2015-02-18 NOTE — Telephone Encounter (Signed)
Pt informed that, per NP, she should take home pain medications as prescribed and increase fluid intake; pt notified to make an appointment with primary physician for follow-up; pt informed that, if symptoms persist or become worse, to go the emergency department for evaluation; pt verbalizes understanding

## 2015-02-19 ENCOUNTER — Telehealth (HOSPITAL_COMMUNITY): Payer: Self-pay | Admitting: Hematology

## 2015-02-19 ENCOUNTER — Encounter (HOSPITAL_COMMUNITY): Payer: Self-pay | Admitting: *Deleted

## 2015-02-19 ENCOUNTER — Non-Acute Institutional Stay (HOSPITAL_COMMUNITY)
Admission: AD | Admit: 2015-02-19 | Discharge: 2015-02-19 | Disposition: A | Discharge status: Home / Self Care | Payer: Medicaid Other | Attending: Internal Medicine | Admitting: Internal Medicine

## 2015-02-19 DIAGNOSIS — Z9049 Acquired absence of other specified parts of digestive tract: Secondary | ICD-10-CM | POA: Diagnosis not present

## 2015-02-19 DIAGNOSIS — Z87891 Personal history of nicotine dependence: Secondary | ICD-10-CM | POA: Diagnosis not present

## 2015-02-19 DIAGNOSIS — R0781 Pleurodynia: Secondary | ICD-10-CM | POA: Insufficient documentation

## 2015-02-19 DIAGNOSIS — M545 Low back pain: Secondary | ICD-10-CM | POA: Diagnosis not present

## 2015-02-19 DIAGNOSIS — D5700 Hb-SS disease with crisis, unspecified: Secondary | ICD-10-CM | POA: Diagnosis present

## 2015-02-19 DIAGNOSIS — Z79899 Other long term (current) drug therapy: Secondary | ICD-10-CM | POA: Insufficient documentation

## 2015-02-19 DIAGNOSIS — D571 Sickle-cell disease without crisis: Secondary | ICD-10-CM | POA: Diagnosis not present

## 2015-02-19 LAB — CBC WITH DIFFERENTIAL/PLATELET
Basophils Absolute: 0 10*3/uL (ref 0.0–0.1)
Basophils Relative: 0 %
EOS PCT: 1 %
Eosinophils Absolute: 0.1 10*3/uL (ref 0.0–0.7)
HEMATOCRIT: 27 % — AB (ref 36.0–46.0)
HEMOGLOBIN: 9.2 g/dL — AB (ref 12.0–15.0)
LYMPHS ABS: 3.7 10*3/uL (ref 0.7–4.0)
LYMPHS PCT: 30 %
MCH: 35.4 pg — AB (ref 26.0–34.0)
MCHC: 34.1 g/dL (ref 30.0–36.0)
MCV: 103.8 fL — AB (ref 78.0–100.0)
Monocytes Absolute: 1.4 10*3/uL — ABNORMAL HIGH (ref 0.1–1.0)
Monocytes Relative: 12 %
Neutro Abs: 6.9 10*3/uL (ref 1.7–7.7)
Neutrophils Relative %: 57 %
PLATELETS: 319 10*3/uL (ref 150–400)
RBC: 2.6 MIL/uL — AB (ref 3.87–5.11)
RDW: 17.8 % — ABNORMAL HIGH (ref 11.5–15.5)
WBC: 12.2 10*3/uL — AB (ref 4.0–10.5)

## 2015-02-19 LAB — LACTATE DEHYDROGENASE: LDH: 222 U/L — ABNORMAL HIGH (ref 98–192)

## 2015-02-19 MED ORDER — OXYCODONE HCL 5 MG PO TABS: 5.0000 mg | ORAL_TABLET | Freq: Once | ORAL | Status: DC

## 2015-02-19 MED ORDER — KETOROLAC TROMETHAMINE 30 MG/ML IJ SOLN
30.0000 mg | Freq: Once | INTRAMUSCULAR | Status: AC
  Administered 2015-02-19: 30 mg via INTRAVENOUS
  Filled 2015-02-19: qty 1

## 2015-02-19 MED ORDER — SODIUM CHLORIDE 0.9 % IJ SOLN
10.0000 mL | INTRAMUSCULAR | Status: AC | PRN
  Administered 2015-02-19: 10 mL

## 2015-02-19 MED ORDER — DEXTROSE-NACL 5-0.45 % IV SOLN
INTRAVENOUS | Status: DC
  Administered 2015-02-19: 12:00:00 via INTRAVENOUS

## 2015-02-19 MED ORDER — DIPHENHYDRAMINE HCL 12.5 MG/5ML PO ELIX: 12.5000 mg | ORAL_SOLUTION | Freq: Four times a day (QID) | ORAL | Status: DC | PRN

## 2015-02-19 MED ORDER — OXYCODONE-ACETAMINOPHEN 5-325 MG PO TABS: 1.0000 | ORAL_TABLET | Freq: Once | ORAL | Status: DC

## 2015-02-19 MED ORDER — SODIUM CHLORIDE 0.9 % IJ SOLN: 9.0000 mL | INTRAMUSCULAR | Status: DC | PRN

## 2015-02-19 MED ORDER — HEPARIN SOD (PORK) LOCK FLUSH 100 UNIT/ML IV SOLN
500.0000 [IU] | INTRAVENOUS | Status: AC | PRN
  Administered 2015-02-19: 500 [IU]
  Filled 2015-02-19: qty 5

## 2015-02-19 MED ORDER — HYDROMORPHONE 1 MG/ML IV SOLN
INTRAVENOUS | Status: DC
  Administered 2015-02-19: 8 mg via INTRAVENOUS
  Administered 2015-02-19: 12:00:00 via INTRAVENOUS
  Filled 2015-02-19: qty 25

## 2015-02-19 MED ORDER — NALOXONE HCL 0.4 MG/ML IJ SOLN: 0.4000 mg | INTRAMUSCULAR | Status: DC | PRN

## 2015-02-19 MED ORDER — ONDANSETRON HCL 4 MG/2ML IJ SOLN: 4.0000 mg | Freq: Four times a day (QID) | INTRAMUSCULAR | Status: DC | PRN

## 2015-02-19 MED ORDER — SODIUM CHLORIDE 0.9 % IV SOLN
12.5000 mg | Freq: Four times a day (QID) | INTRAVENOUS | Status: DC | PRN
  Administered 2015-02-19: 12.5 mg via INTRAVENOUS
  Filled 2015-02-19 (×3): qty 0.25

## 2015-02-19 NOTE — Telephone Encounter (Signed)
Patient C/O pain to rib cage and hips.  Rates pain 7/10 on pain scale.  Patient denies chest pain, shortness of breath, difficulty breathing.  Patient also denies N/V/D.  Patient states she has taken home medications without improvement since yesterday. Placed caller on spoke with Armeniahina [name], NP.  [facility].  Patient verbalizes understanding.

## 2015-02-19 NOTE — Discharge Instructions (Addendum)
Sickle Cell Anemia, Adult Sickle cell anemia is a condition in which red blood cells have an abnormal "sickle" shape. This abnormal shape shortens the cells' life span, which results in a lower than normal concentration of red blood cells in the blood. The sickle shape also causes the cells to clump together and block free blood flow through the blood vessels. As a result, the tissues and organs of the body do not receive enough oxygen. Sickle cell anemia causes organ damage and pain and increases the risk of infection. CAUSES  Sickle cell anemia is a genetic disorder. Those who receive two copies of the gene have the condition, and those who receive one copy have the trait. RISK FACTORS The sickle cell gene is most common in people whose families originated in [place]. Other areas of the globe where sickle cell trait occurs include the Mediterranean, [place] and [place], the [place], and the [place].  SIGNS AND SYMPTOMS  Pain, especially in the extremities, back, chest, or abdomen (common). The pain may start suddenly or may develop following an illness, especially if there is dehydration. Pain can also occur due to overexertion or exposure to extreme temperature changes.  Frequent severe bacterial infections, especially certain types of pneumonia and meningitis.  Pain and swelling in the hands and feet.  Decreased activity.   Loss of appetite.   Change in behavior.  Headaches.  Seizures.  Shortness of breath or difficulty breathing.  Vision changes.  Skin ulcers. Those with the trait may not have symptoms or they may have mild symptoms.  DIAGNOSIS  Sickle cell anemia is diagnosed with blood tests that demonstrate the genetic trait. It is often diagnosed during the newborn period, due to mandatory testing nationwide. A variety of blood tests, X-rays, CT scans, MRI scans, ultrasounds, and lung function tests may also be done to monitor the condition. TREATMENT  Sickle  cell anemia may be treated with:  Medicines. You may be given pain medicines, antibiotic medicines (to treat and prevent infections) or medicines to increase the production of certain types of hemoglobin.  Fluids.  Oxygen.  Blood transfusions. HOME CARE INSTRUCTIONS   Drink enough fluid to keep your urine clear or pale yellow. Increase your fluid intake in hot weather and during exercise.  Do not smoke. Smoking lowers oxygen levels in the blood.   Only take over-the-counter or prescription medicines for pain, fever, or discomfort as directed by your health care provider.  Take antibiotics as directed by your health care provider. Make sure you finish them it even if you start to feel better.   Take supplements as directed by your health care provider.   Consider wearing a medical alert bracelet. This tells anyone caring for you in an emergency of your condition.   When traveling, keep your medical information, health care provider's names, and the medicines you take with you at all times.   If you develop a fever, do not take medicines to reduce the fever right away. This could cover up a problem that is developing. Notify your health care provider.  Keep all follow-up appointments with your health care provider. Sickle cell anemia requires regular medical care. SEEK MEDICAL CARE IF: You have a fever. SEEK IMMEDIATE MEDICAL CARE IF:   You feel dizzy or faint.   You have new abdominal pain, especially on the left side near the stomach area.   You develop a persistent, often uncomfortable and painful penile erection (priapism). If this is not treated immediately it   will lead to impotence.   You have numbness your arms or legs or you have a hard time moving them.   You have a hard time with speech.   You have a fever or persistent symptoms for more than 2-3 days.   You have a fever and your symptoms suddenly get worse.   You have signs or symptoms of infection.  These include:   Chills.   Abnormal tiredness (lethargy).   Irritability.   Poor eating.   Vomiting.   You develop pain that is not helped with medicine.   You develop shortness of breath.  You have pain in your chest.   You are coughing up pus-like or bloody sputum.   You develop a stiff neck.  Your feet or hands swell or have pain.  Your abdomen appears bloated.  You develop joint pain. MAKE SURE YOU:  Understand these instructions.   This information is not intended to replace advice given to you by your health care provider. Make sure you discuss any questions you have with your health care provider.   Document Released: 05/27/2005 Document Revised: 03/09/2014 Document Reviewed: 09/28/2012 Elsevier Interactive Patient Education 2016 [employer]  

## 2015-02-19 NOTE — Progress Notes (Signed)
[facility]. Pt stated pain was at 7 on admission and down to 6 at discharge. Pt was treated with IV fluids, Dilaudid PCA and Toradol. Pt tolerate treatment well. Pt voiced understanding of d/c instructions and d/c to home alert, oriented and ambulatory.

## 2015-02-19 NOTE — H&P (Signed)
[facility] History and Physical   Date: 02/19/2015  Patient name: Brittany CampbellMiranda Foster Medical record number: [id number] Date of birth: 03/24/1985 Age: 29 y.o. Gender: female PCP: Jeanann LewandowskyJEGEDE, OLUGBEMIGA, MD  Attending physician: Quentin Angstlugbemiga E Jegede, MD  Chief Complaint: Left rib and low back pain  History of Present Illness:  Brittany CampbellMiranda Foster, a 29 year old female with a history of sickle cell anemia, HbSS presents with left rib and low back. Patient attributes current pain crisis to changes in weather. She describes current pain intensity as constant and sharp. She states that pain intensity has increased over the past several days. Patient was advised on 02/18/2015 to make a follow up appointment with primary provider for chronic pain. Brittany Foster state that she was unable to get an appointment.  Current pain intensity is 7/10. She states that she had Percocet 10-325 mg and Aleeve this am with minimal relief. She maintains that she is taking all other medications consistently. Patient denies fatigue, headache, shortness of breath, urinary problems, nausea, vomiting, or diarrhea.   Meds: Prescriptions prior to admission  Medication Sig Dispense Refill Last Dose  . Buprenorphine HCl (BELBUCA) 75 MCG FILM Place 75 mcg inside cheek 2 (two) times daily. 60 each 0 02/15/2015 at Unknown time  . Cholecalciferol (VITAMIN D3) 5000 UNITS CAPS Take 1 capsule (5,000 Units total) by mouth daily. 30 capsule 5 02/14/2015 at Unknown time  . DULoxetine (CYMBALTA) 60 MG capsule Take 1 capsule (60 mg total) by mouth daily. 30 capsule 3 02/14/2015 at Unknown time  . folic acid (FOLVITE) 1 MG tablet Take 1 tablet (1 mg total) by mouth daily. 90 tablet 11 02/14/2015 at Unknown time  . gabapentin (NEURONTIN) 300 MG capsule Take 1 capsule (300 mg total) by mouth 3 (three) times daily. 90 capsule 2 02/14/2015 at Unknown time  . hydroxyurea (HYDREA) 500 MG capsule Take 2 capsules (1,000 mg total) by mouth  daily. May take with food to minimize GI side effects. 60 capsule 3 02/14/2015 at Unknown time  . oxyCODONE-acetaminophen (PERCOCET) 10-325 MG tablet Take 1 tablet by mouth every 6 (six) hours as needed for pain. No Refill before 02/07/2015 120 tablet 0 02/15/2015 at Unknown time  . potassium chloride SA (K-DUR,KLOR-CON) 20 MEQ tablet Take 1 tablet (20 mEq total) by mouth daily. 30 tablet 2 02/14/2015 at Unknown time  . Topiramate ER 100 MG CP24 Take 100 mg by mouth at bedtime.   02/14/2015 at Unknown time    Allergies: Ultram and Morphine and related Past Medical History  Diagnosis Date  . Sickle cell anemia [facility](HCC)    Past Surgical History  Procedure Laterality Date  . Tubal ligation    . Cholecystectomy    . Cesarean section    . Port a cath placement Right     about 6-7 years ago  . Cholecystectomy  2000   Family History  Problem Relation Age of Onset  . Sickle cell anemia Other   . Sickle cell trait Father   . Sickle cell trait Mother    Social History   Social History  . Marital Status: Single    Spouse Name: N/A  . Number of Children: N/A  . Years of Education: N/A   Occupational History  . Not on file.   Social History Main Topics  . Smoking status: Former Games developermoker  . Smokeless tobacco: Not on file     Comment: smokes once every couple of weeks.   .Marland Kitchen  Alcohol Use: No  . Drug Use: No  . Sexual Activity: Yes    Birth Control/ Protection: Surgical   Other Topics Concern  . Not on file   Social History Narrative    Review of Systems: Constitutional: negative for fatigue and fevers Eyes: negative Ears, nose, mouth, throat, and face: negative Respiratory: negative for cough, dyspnea on exertion and wheezing Cardiovascular: negative for chest pain, fatigue, orthopnea and tachypnea Gastrointestinal: negative for constipation, diarrhea and nausea Genitourinary:negative Integument/breast: negative Hematologic/lymphatic: negative Musculoskeletal:negative for  myalgias Neurological: negative Behavioral/Psych: negative Endocrine: negative Allergic/Immunologic: negative  Physical Exam: Last menstrual period 02/15/2015. LMP 02/15/2015  General Appearance:    Alert, cooperative, mild distress, appears stated age  Head:    Normocephalic, without obvious abnormality, atraumatic  Eyes:    PERRL, conjunctiva/corneas clear, EOM's intact, fundi    benign, both eyes  Ears:    Normal TM's and external ear canals, both ears  Nose:   Nares normal, septum midline, mucosa normal, no drainage    or sinus tenderness  Back:     Symmetric, no curvature, ROM normal, no CVA tenderness  Lungs:     Clear to auscultation bilaterally, respirations unlabored  Chest Wall:    No tenderness or deformity   Heart:    Regular rate and rhythm, S1 and S2 normal, no murmur, rub   or gallop  Abdomen:     Soft, non-tender, bowel sounds active all four quadrants,    no masses, no organomegaly  Extremities:   Extremities normal, atraumatic, no cyanosis or edema  Pulses:   2+ and symmetric all extremities  Skin:   Skin color, texture, turgor normal, no rashes or lesions  Lymph nodes:   Cervical, supraclavicular, and axillary nodes normal  Neurologic:   CNII-XII intact, normal strength, sensation and reflexes    throughout    Lab results: No results found for this or any previous visit (from the past 24 hour(s)).  Imaging results:  No results found.   Assessment & Plan:  Patient will be admitted to the day infusion center for extended observation  Start IV D5.45 for cellular rehydration at 100/hr  Start Toradol 30 mg IV every 6 hours for inflammation.  Start Dilaudid PCA High Concentration per weight based protocol.   Patient will be re-evaluated for pain intensity in the context of function and relationship to               baseline pain intensity as care progresses.  If no significant pain relief, will transfer patient to inpatient services for a higher level of  care.   Reviewed labs from previous encounter. Will order CBC w/differential and reticulocytes.    Brittany Foster M 02/19/2015, 11:33 AM

## 2015-02-19 NOTE — Discharge Summary (Signed)
[facility] Discharge Summary   Patient ID: Brittany Foster MRN: [id number] DOB/AGE: 30/03/1985 30 y.o.  Admit date: 02/19/2015 Discharge date: 02/19/2015  Primary Care Physician:  Brittany LewandowskyJEGEDE, OLUGBEMIGA, MD  Admission Diagnoses:  Active Problems:   Sickle cell anemia with pain (HCC)  Discharge Medications:    Medication List    TAKE these medications        Buprenorphine HCl 75 MCG Film  Commonly known as:  BELBUCA  Place 75 mcg inside cheek 2 (two) times daily.     DULoxetine 60 MG capsule  Commonly known as:  CYMBALTA  Take 1 capsule (60 mg total) by mouth daily.     folic acid 1 MG tablet  Commonly known as:  FOLVITE  Take 1 tablet (1 mg total) by mouth daily.     gabapentin 300 MG capsule  Commonly known as:  NEURONTIN  Take 1 capsule (300 mg total) by mouth 3 (three) times daily.     hydroxyurea 500 MG capsule  Commonly known as:  HYDREA  Take 2 capsules (1,000 mg total) by mouth daily. May take with food to minimize GI side effects.     oxyCODONE-acetaminophen 10-325 MG tablet  Commonly known as:  PERCOCET  Take 1 tablet by mouth every 6 (six) hours as needed for pain. No Refill before 02/07/2015     potassium chloride SA 20 MEQ tablet  Commonly known as:  K-DUR,KLOR-CON  Take 1 tablet (20 mEq total) by mouth daily.     Topiramate ER 100 MG Cp24  Take 100 mg by mouth at bedtime.     Vitamin D3 5000 UNITS Caps  Take 1 capsule (5,000 Units total) by mouth daily.         Consults:  None  Significant Diagnostic Studies:  No results found.   [facility] Course: -[name] was admitted to the day infusion center for extended observation.  Started D5.45 at 125 ml per hour for cellular rehydration.  Patient was started on a high concentration dilaudid PCA. She used a total of 8 mg with 16 demands and 8 deliveries. She reports that pain intensity is  7/10. She received Percocet 10-325 mg 30 minutes prior  to discharge. Patient can manage at home on current medication regimen.  -She is alert, oriented, and ambulatory.  -Patient is to follow up in office as scheduled with Dr. Hyman HopesJegede. - Recommend that she continue rest and increase hydration to 64 ounce of water every other hour.    Physical Exam at Discharge: BP 105/68 mmHg  Pulse 81  Temp(Src) 98.3 F (36.8 C) (Oral)  Resp 13  Ht 5' (1.524 m)  Wt 113 lb (51.256 kg)  BMI 22.07 kg/m2  SpO2 100%  LMP 02/15/2015  General Appearance:    Alert, cooperative, no distress, appears stated age  Head:    Normocephalic, without obvious abnormality, atraumatic  Eyes:    PERRL, conjunctiva/corneas clear, EOM's intact, fundi    benign, both eyes  Back:     Symmetric, no curvature, ROM normal, no CVA tenderness  Lungs:     Clear to auscultation bilaterally, respirations unlabored  Chest Wall:    No tenderness or deformity   Heart:    Regular rate and rhythm, S1 and S2 normal, no murmur, rub   or gallop  Extremities:   Extremities normal, atraumatic, no cyanosis or edema  Pulses:   2+ and symmetric all extremities  Skin:  Skin color, texture, turgor normal, no rashes or lesions  Lymph nodes:   Cervical, supraclavicular, and axillary nodes normal  Neurologic:   CNII-XII intact, normal strength, sensation and reflexes    throughout   Disposition at Discharge: 01-Home or Self Care  Discharge Orders: Discharge Instructions    Discharge patient    Complete by:  As directed            Condition at Discharge:   Stable  Time spent on Discharge:  15 minutes  Signed: Dessirae Scarola M 02/19/2015, 2:56 PM

## 2015-02-26 ENCOUNTER — Telehealth: Payer: Self-pay | Admitting: Hematology

## 2015-02-26 ENCOUNTER — Non-Acute Institutional Stay (HOSPITAL_COMMUNITY)
Admission: AD | Admit: 2015-02-26 | Discharge: 2015-02-26 | Disposition: A | Discharge status: Home / Self Care | Payer: Medicaid Other | Source: Ambulatory Visit | Attending: Internal Medicine | Admitting: Internal Medicine

## 2015-02-26 ENCOUNTER — Encounter (HOSPITAL_COMMUNITY): Payer: Self-pay | Admitting: *Deleted

## 2015-02-26 ENCOUNTER — Telehealth (HOSPITAL_COMMUNITY): Payer: Self-pay | Admitting: Hematology

## 2015-02-26 DIAGNOSIS — D57819 Other sickle-cell disorders with crisis, unspecified: Secondary | ICD-10-CM

## 2015-02-26 DIAGNOSIS — D5700 Hb-SS disease with crisis, unspecified: Secondary | ICD-10-CM | POA: Diagnosis not present

## 2015-02-26 DIAGNOSIS — Z79891 Long term (current) use of opiate analgesic: Secondary | ICD-10-CM | POA: Insufficient documentation

## 2015-02-26 DIAGNOSIS — Z87891 Personal history of nicotine dependence: Secondary | ICD-10-CM | POA: Insufficient documentation

## 2015-02-26 DIAGNOSIS — Z79899 Other long term (current) drug therapy: Secondary | ICD-10-CM | POA: Insufficient documentation

## 2015-02-26 DIAGNOSIS — R52 Pain, unspecified: Secondary | ICD-10-CM | POA: Diagnosis present

## 2015-02-26 LAB — CBC WITH DIFFERENTIAL/PLATELET
BASOS ABS: 0 10*3/uL (ref 0.0–0.1)
BASOS PCT: 0 %
EOS ABS: 0.2 10*3/uL (ref 0.0–0.7)
Eosinophils Relative: 2 %
HEMATOCRIT: 24.8 % — AB (ref 36.0–46.0)
HEMOGLOBIN: 8.6 g/dL — AB (ref 12.0–15.0)
Lymphocytes Relative: 27 %
Lymphs Abs: 3.2 10*3/uL (ref 0.7–4.0)
MCH: 36.3 pg — ABNORMAL HIGH (ref 26.0–34.0)
MCHC: 34.7 g/dL (ref 30.0–36.0)
MCV: 104.6 fL — ABNORMAL HIGH (ref 78.0–100.0)
MONOS PCT: 9 %
Monocytes Absolute: 1.1 10*3/uL — ABNORMAL HIGH (ref 0.1–1.0)
NEUTROS ABS: 7.3 10*3/uL (ref 1.7–7.7)
NEUTROS PCT: 62 %
Platelets: 268 10*3/uL (ref 150–400)
RBC: 2.37 MIL/uL — ABNORMAL LOW (ref 3.87–5.11)
RDW: 18.2 % — AB (ref 11.5–15.5)
WBC: 11.7 10*3/uL — ABNORMAL HIGH (ref 4.0–10.5)

## 2015-02-26 LAB — RETICULOCYTES
RBC.: 2.37 MIL/uL — AB (ref 3.87–5.11)
RETIC COUNT ABSOLUTE: 348.4 10*3/uL — AB (ref 19.0–186.0)
RETIC CT PCT: 14.7 % — AB (ref 0.4–3.1)

## 2015-02-26 LAB — COMPREHENSIVE METABOLIC PANEL
ALBUMIN: 4.5 g/dL (ref 3.5–5.0)
ALT: 36 U/L (ref 14–54)
AST: 45 U/L — AB (ref 15–41)
Alkaline Phosphatase: 109 U/L (ref 38–126)
Anion gap: 7 (ref 5–15)
BILIRUBIN TOTAL: 2.2 mg/dL — AB (ref 0.3–1.2)
BUN: 8 mg/dL (ref 6–20)
CALCIUM: 9.2 mg/dL (ref 8.9–10.3)
CO2: 22 mmol/L (ref 22–32)
CREATININE: 0.34 mg/dL — AB (ref 0.44–1.00)
Chloride: 111 mmol/L (ref 101–111)
GFR calc Af Amer: >60 mL/min (ref >60)
GFR calc non Af Amer: >60 mL/min (ref >60)
Glucose, Bld: 99 mg/dL (ref 65–99)
POTASSIUM: 3.5 mmol/L (ref 3.5–5.1)
Sodium: 140 mmol/L (ref 135–145)
TOTAL PROTEIN: 8.1 g/dL (ref 6.5–8.1)

## 2015-02-26 MED ORDER — ONDANSETRON HCL 4 MG/2ML IJ SOLN: 4.0000 mg | Freq: Four times a day (QID) | INTRAMUSCULAR | Status: DC | PRN

## 2015-02-26 MED ORDER — KETOROLAC TROMETHAMINE 30 MG/ML IJ SOLN
30.0000 mg | Freq: Once | INTRAMUSCULAR | Status: AC
  Administered 2015-02-26: 30 mg via INTRAVENOUS
  Filled 2015-02-26: qty 1

## 2015-02-26 MED ORDER — OXYCODONE HCL 5 MG PO TABS
5.0000 mg | ORAL_TABLET | Freq: Once | ORAL | Status: AC
  Administered 2015-02-26: 5 mg via ORAL
  Filled 2015-02-26: qty 1

## 2015-02-26 MED ORDER — HEPARIN SOD (PORK) LOCK FLUSH 100 UNIT/ML IV SOLN
500.0000 [IU] | INTRAVENOUS | Status: AC | PRN
  Administered 2015-02-26: 500 [IU]
  Filled 2015-02-26: qty 5

## 2015-02-26 MED ORDER — NALOXONE HCL 0.4 MG/ML IJ SOLN: 0.4000 mg | INTRAMUSCULAR | Status: DC | PRN

## 2015-02-26 MED ORDER — SODIUM CHLORIDE 0.9 % IJ SOLN: 9.0000 mL | INTRAMUSCULAR | Status: DC | PRN

## 2015-02-26 MED ORDER — HYDROMORPHONE 1 MG/ML IV SOLN
INTRAVENOUS | Status: DC
  Administered 2015-02-26: 12:00:00 via INTRAVENOUS
  Administered 2015-02-26: 14.5 mg via INTRAVENOUS
  Filled 2015-02-26: qty 25

## 2015-02-26 MED ORDER — OXYCODONE-ACETAMINOPHEN 5-325 MG PO TABS
1.0000 | ORAL_TABLET | Freq: Once | ORAL | Status: AC
  Administered 2015-02-26: 1 via ORAL
  Filled 2015-02-26: qty 1

## 2015-02-26 MED ORDER — DEXTROSE-NACL 5-0.45 % IV SOLN
INTRAVENOUS | Status: DC
  Administered 2015-02-26: 11:00:00 via INTRAVENOUS

## 2015-02-26 MED ORDER — SODIUM CHLORIDE 0.9 % IJ SOLN
10.0000 mL | INTRAMUSCULAR | Status: AC | PRN
  Administered 2015-02-26: 10 mL

## 2015-02-26 MED ORDER — SODIUM CHLORIDE 0.9 % IV SOLN
12.5000 mg | Freq: Four times a day (QID) | INTRAVENOUS | Status: DC | PRN
  Administered 2015-02-26: 12.5 mg via INTRAVENOUS
  Filled 2015-02-26 (×3): qty 0.25

## 2015-02-26 MED ORDER — DIPHENHYDRAMINE HCL 12.5 MG/5ML PO ELIX: 12.5000 mg | ORAL_SOLUTION | Freq: Four times a day (QID) | ORAL | Status: DC | PRN

## 2015-02-26 NOTE — Progress Notes (Signed)
[facility]. Pt was treated with  IV fluids, Dilaudid PCA and Toradol. Pt's pain # was at 8 on admission and down to 7 at discharge. Pt tolerated treatment well. Discharged to home with verbal understanding of discharge instructions and alert, oriented and ambulatory.

## 2015-02-26 NOTE — H&P (Signed)
[facility] History and Physical   Date: 02/26/2015  Patient name: Brittany Foster Medical record number: [id number] Date of birth: [birth year] Age: 29 y.o. Gender: female PCP: Jeanann Lewandowsky, MD  Attending physician: Quentin Angst, MD  Chief Complaint: Generalized  History of Present Illness:  [name], a 29 year old female with a history of sickle cell anemia, HbSS presents with left rib and low back. She describes current pain intensity as constant and sharp. She states that pain intensity has increased around 4 am. Patient was advised several weeks ago to make a follow up appointment with primary provider for chronic pain. Ms. Moxon state that she was unable to get an appointment.  Current pain intensity is 9/10. She states that she had Percocet 10-325 mg and Aleeve this am with minimal relief. She maintains that she is taking all other medications consistently. Patient denies fatigue, headache, shortness of breath, urinary problems, nausea, vomiting, or diarrhea.   Meds: Prescriptions prior to admission  Medication Sig Dispense Refill Last Dose  . Buprenorphine HCl (BELBUCA) 75 MCG FILM Place 75 mcg inside cheek 2 (two) times daily. 60 each 0 Past Week at Unknown time  . DULoxetine (CYMBALTA) 60 MG capsule Take 1 capsule (60 mg total) by mouth daily. 30 capsule 3 02/25/2015 at Unknown time  . folic acid (FOLVITE) 1 MG tablet Take 1 tablet (1 mg total) by mouth daily. 90 tablet 11 02/25/2015 at Unknown time  . gabapentin (NEURONTIN) 300 MG capsule Take 1 capsule (300 mg total) by mouth 3 (three) times daily. 90 capsule 2 02/25/2015 at Unknown time  . hydroxyurea (HYDREA) 500 MG capsule Take 2 capsules (1,000 mg total) by mouth daily. May take with food to minimize GI side effects. 60 capsule 3 02/25/2015 at Unknown time  . oxyCODONE-acetaminophen (PERCOCET) 10-325 MG tablet Take 1 tablet by mouth every 6 (six) hours as needed for pain. No Refill  before 02/07/2015 120 tablet 0 02/26/2015 at Unknown time  . potassium chloride SA (K-DUR,KLOR-CON) 20 MEQ tablet Take 1 tablet (20 mEq total) by mouth daily. 30 tablet 2 Past Week at Unknown time  . Topiramate ER 100 MG CP24 Take 100 mg by mouth at bedtime.   Past Week at Unknown time  . Cholecalciferol (VITAMIN D3) 5000 UNITS CAPS Take 1 capsule (5,000 Units total) by mouth daily. 30 capsule 5 Unknown at Unknown time    Allergies: Ultram and Morphine and related Past Medical History  Diagnosis Date  . Sickle cell anemia [facility])    Past Surgical History  Procedure Laterality Date  . Tubal ligation    . Cholecystectomy    . Cesarean section    . Port a cath placement Right     about 6-7 years ago  . Cholecystectomy  2000   Family History  Problem Relation Age of Onset  . Sickle cell anemia Other   . Sickle cell trait Father   . Sickle cell trait Mother    Social History   Social History  . Marital Status: Single    Spouse Name: N/A  . Number of Children: N/A  . Years of Education: N/A   Occupational History  . Not on file.   Social History Main Topics  . Smoking status: Former Games developer  . Smokeless tobacco: Not on file     Comment: smokes once every couple of weeks.   . Alcohol Use: No  . Drug Use: No  .  Sexual Activity: Yes    Birth Control/ Protection: Surgical   Other Topics Concern  . Not on file   Social History Narrative    Review of Systems: Constitutional: negative for fatigue and fevers Eyes: negative Ears, nose, mouth, throat, and face: negative Respiratory: negative for cough, dyspnea on exertion and wheezing Cardiovascular: negative for chest pain, fatigue, orthopnea and tachypnea Gastrointestinal: negative for constipation, diarrhea and nausea Genitourinary:negative Integument/breast: negative Hematologic/lymphatic: negative Musculoskeletal:negative for myalgias Neurological: negative Behavioral/Psych: negative Endocrine:  negative Allergic/Immunologic: negative  Physical Exam: Blood pressure 108/76, pulse 96, temperature 98.9 F (37.2 C), resp. rate 20, height 5\' 1"  (1.549 m), weight 112 lb (50.803 kg), last menstrual period 02/15/2015, SpO2 98 %. BP 108/76 mmHg  Pulse 96  Temp(Src) 98.9 F (37.2 C)  Resp 20  Ht 5\' 1"  (1.549 m)  Wt 112 lb (50.803 kg)  BMI 21.17 kg/m2  SpO2 98%  LMP 02/15/2015  General Appearance:    Alert, cooperative, mild distress, appears stated age  Head:    Normocephalic, without obvious abnormality, atraumatic  Eyes:    PERRL, conjunctiva/corneas clear, EOM's intact, fundi    benign, both eyes  Ears:    Normal TM's and external ear canals, both ears  Nose:   Nares normal, septum midline, mucosa normal, no drainage    or sinus tenderness  Back:     Symmetric, no curvature, ROM normal, no CVA tenderness  Lungs:     Clear to auscultation bilaterally, respirations unlabored  Chest Wall:    No tenderness or deformity   Heart:    Regular rate and rhythm, S1 and S2 normal, no murmur, rub   or gallop  Abdomen:     Soft, non-tender, bowel sounds active all four quadrants,    no masses, no organomegaly  Extremities:   Extremities normal, atraumatic, no cyanosis or edema  Pulses:   2+ and symmetric all extremities  Skin:   Skin color, texture, turgor normal, no rashes or lesions  Lymph nodes:   Cervical, supraclavicular, and axillary nodes normal  Neurologic:   CNII-XII intact, normal strength, sensation and reflexes    throughout    Lab results: No results found for this or any previous visit (from the past 24 hour(s)).  Imaging results:  No results found.   Assessment & Plan:  Patient will be admitted to the day infusion center for extended observation  Start IV D5.45 for cellular rehydration at 125/hr  Start Toradol 30 mg IV every 6 hours for inflammation.  Start Dilaudid PCA High Concentration per weight based protocol.   Patient will be re-evaluated for pain  intensity in the context of function and relationship to                 baseline pain intensity as care progresses.  If no significant pain relief, will transfer patient to inpatient services for a higher level of care.   Will review CBCw/d, reticulocytes, and CMP.    Lynsay Fesperman M 02/26/2015, 11:05 AM

## 2015-02-26 NOTE — Telephone Encounter (Signed)
Patient C/O pain to ribs and legs that is 8/10 on pain scale. Patient states no improvement from home meds.  Patient denies chest pain, shortness of breath, N/V/D.  I advised I would notify the physician and give a call back.  Patient verbalizes understanding.

## 2015-02-26 NOTE — Telephone Encounter (Signed)
Discussed with NP, [name].  It is ok for patient to come to [facility]. Patient verbalizes understanding.

## 2015-02-26 NOTE — Discharge Summary (Signed)
[facility] Discharge Summary   Patient ID: Brittany Foster MRN: [id number] DOB/AGE: 1985/08/30 29 y.o.  Admit date: 02/26/2015 Discharge date: 02/26/2015  Primary Care Physician:  Jeanann Lewandowsky, MD  Admission Diagnoses:  Active Problems:   Sickle-cell disease with pain [facility])  Discharge Medications:    Medication List    ASK your doctor about these medications        Buprenorphine HCl 75 MCG Film  Commonly known as:  BELBUCA  Place 75 mcg inside cheek 2 (two) times daily.     DULoxetine 60 MG capsule  Commonly known as:  CYMBALTA  Take 1 capsule (60 mg total) by mouth daily.     folic acid 1 MG tablet  Commonly known as:  FOLVITE  Take 1 tablet (1 mg total) by mouth daily.     gabapentin 300 MG capsule  Commonly known as:  NEURONTIN  Take 1 capsule (300 mg total) by mouth 3 (three) times daily.     hydroxyurea 500 MG capsule  Commonly known as:  HYDREA  Take 2 capsules (1,000 mg total) by mouth daily. May take with food to minimize GI side effects.     oxyCODONE-acetaminophen 10-325 MG tablet  Commonly known as:  PERCOCET  Take 1 tablet by mouth every 6 (six) hours as needed for pain. No Refill before 02/07/2015     potassium chloride SA 20 MEQ tablet  Commonly known as:  K-DUR,KLOR-CON  Take 1 tablet (20 mEq total) by mouth daily.     Topiramate ER 100 MG Cp24  Take 100 mg by mouth at bedtime.     Vitamin D3 5000 units Caps  Take 1 capsule (5,000 Units total) by mouth daily.         Consults:  None  Significant Diagnostic Studies:  No results found.   [facility] Course: -Ms. Korena Nass was admitted to the day infusion center for extended observation.  Started D5.45 at 125 ml per hour for cellular rehydration.  Patient was started on a high concentration dilaudid PCA. She used a total of 14.5 mg with 29 demands and 29 deliveries. She reports that pain intensity is  7/10. She received Percocet  10-325 mg 30 minutes prior to discharge. Patient can manage at home on current medication regimen.  -She is alert, oriented, and ambulatory.  -Patient is to follow up in office as scheduled with Dr. Hyman Hopes. - Recommend that she continue rest and increase hydration to 64 ounce of water every other hour.    Physical Exam at Discharge: BP 98/63 mmHg  Pulse 82  Temp(Src) 98.3 F (36.8 C) (Oral)  Resp 16  Ht  (1.549 m)  Wt 112 lb (50.803 kg)  BMI 21.17 kg/m2  SpO2 100%  LMP 02/15/2015  General Appearance:    Alert, cooperative, no distress, appears stated age  Head:    Normocephalic, without obvious abnormality, atraumatic  Eyes:    PERRL, conjunctiva/corneas clear, EOM's intact, fundi    benign, both eyes  Back:     Symmetric, no curvature, ROM normal, no CVA tenderness  Lungs:     Clear to auscultation bilaterally, respirations unlabored  Chest Wall:    No tenderness or deformity   Heart:    Regular rate and rhythm, S1 and S2 normal, no murmur, rub   or gallop  Extremities:   Extremities normal, atraumatic, no cyanosis or edema  Pulses:   2+ and symmetric  all extremities  Skin:   Skin color, texture, turgor normal, no rashes or lesions  Lymph nodes:   Cervical, supraclavicular, and axillary nodes normal  Neurologic:   CNII-XII intact, normal strength, sensation and reflexes    throughout   Disposition at Discharge: 01-Home or Self Care  Discharge Orders:   Condition at Discharge:   Stable  Time spent on Discharge:  15 minutes  Signed: Nayelly Laughman M 02/26/2015, 4:50 PM

## 2015-03-01 ENCOUNTER — Telehealth (HOSPITAL_COMMUNITY): Payer: Self-pay | Admitting: *Deleted

## 2015-03-01 NOTE — Telephone Encounter (Signed)
Pt [facility]. Pt states her pain is 9/10. Her pain med was at 5am, Oxy 10mg . Will check with the provider and give her a call back. Pt voiced understanding.

## 2015-03-01 NOTE — Telephone Encounter (Signed)
After checking with the provider C. [name], NP, pt was told to stay at home and continue with her hydration and taking her pain meds as prescribed. Pt voiced understanding.

## 2015-03-05 ENCOUNTER — Telehealth: Payer: Self-pay | Admitting: Internal Medicine

## 2015-03-05 ENCOUNTER — Telehealth (HOSPITAL_COMMUNITY): Payer: Self-pay | Admitting: *Deleted

## 2015-03-05 ENCOUNTER — Non-Acute Institutional Stay (HOSPITAL_COMMUNITY)
Admission: AD | Admit: 2015-03-05 | Discharge: 2015-03-05 | Disposition: A | Discharge status: Home / Self Care | Payer: Medicaid Other | Source: Ambulatory Visit | Attending: Internal Medicine | Admitting: Internal Medicine

## 2015-03-05 ENCOUNTER — Encounter (HOSPITAL_COMMUNITY): Payer: Self-pay

## 2015-03-05 DIAGNOSIS — D571 Sickle-cell disease without crisis: Secondary | ICD-10-CM | POA: Insufficient documentation

## 2015-03-05 DIAGNOSIS — R0781 Pleurodynia: Secondary | ICD-10-CM | POA: Diagnosis not present

## 2015-03-05 DIAGNOSIS — Z87891 Personal history of nicotine dependence: Secondary | ICD-10-CM | POA: Insufficient documentation

## 2015-03-05 DIAGNOSIS — Z79899 Other long term (current) drug therapy: Secondary | ICD-10-CM | POA: Diagnosis not present

## 2015-03-05 DIAGNOSIS — Z9049 Acquired absence of other specified parts of digestive tract: Secondary | ICD-10-CM | POA: Diagnosis not present

## 2015-03-05 DIAGNOSIS — Z79891 Long term (current) use of opiate analgesic: Secondary | ICD-10-CM | POA: Diagnosis not present

## 2015-03-05 DIAGNOSIS — M545 Low back pain: Secondary | ICD-10-CM | POA: Insufficient documentation

## 2015-03-05 DIAGNOSIS — D5700 Hb-SS disease with crisis, unspecified: Secondary | ICD-10-CM | POA: Diagnosis not present

## 2015-03-05 LAB — CBC WITH DIFFERENTIAL/PLATELET
Basophils Absolute: 0 10*3/uL (ref 0.0–0.1)
Basophils Relative: 0 %
EOS ABS: 0.1 10*3/uL (ref 0.0–0.7)
EOS PCT: 1 %
HCT: 24.8 % — ABNORMAL LOW (ref 36.0–46.0)
Hemoglobin: 8.4 g/dL — ABNORMAL LOW (ref 12.0–15.0)
LYMPHS ABS: 3 10*3/uL (ref 0.7–4.0)
LYMPHS PCT: 30 %
MCH: 35.9 pg — AB (ref 26.0–34.0)
MCHC: 33.9 g/dL (ref 30.0–36.0)
MCV: 106 fL — AB (ref 78.0–100.0)
MONO ABS: 1.1 10*3/uL — AB (ref 0.1–1.0)
Monocytes Relative: 11 %
Neutro Abs: 5.6 10*3/uL (ref 1.7–7.7)
Neutrophils Relative %: 58 %
PLATELETS: 299 10*3/uL (ref 150–400)
RBC: 2.34 MIL/uL — ABNORMAL LOW (ref 3.87–5.11)
RDW: 18.3 % — AB (ref 11.5–15.5)
WBC: 9.8 10*3/uL (ref 4.0–10.5)

## 2015-03-05 LAB — COMPREHENSIVE METABOLIC PANEL
ALT: 37 U/L (ref 14–54)
ANION GAP: 8 (ref 5–15)
AST: 53 U/L — ABNORMAL HIGH (ref 15–41)
Albumin: 4 g/dL (ref 3.5–5.0)
Alkaline Phosphatase: 89 U/L (ref 38–126)
BUN: 7 mg/dL (ref 6–20)
CHLORIDE: 110 mmol/L (ref 101–111)
CO2: 23 mmol/L (ref 22–32)
CREATININE: 0.41 mg/dL — AB (ref 0.44–1.00)
Calcium: 8.8 mg/dL — ABNORMAL LOW (ref 8.9–10.3)
Glucose, Bld: 111 mg/dL — ABNORMAL HIGH (ref 65–99)
POTASSIUM: 3.2 mmol/L — AB (ref 3.5–5.1)
SODIUM: 141 mmol/L (ref 135–145)
Total Bilirubin: 2 mg/dL — ABNORMAL HIGH (ref 0.3–1.2)
Total Protein: 7.1 g/dL (ref 6.5–8.1)

## 2015-03-05 LAB — RETICULOCYTES
RBC.: 2.34 MIL/uL — AB (ref 3.87–5.11)
RETIC COUNT ABSOLUTE: 456.3 10*3/uL — AB (ref 19.0–186.0)
RETIC CT PCT: 19.5 % — AB (ref 0.4–3.1)

## 2015-03-05 MED ORDER — SODIUM CHLORIDE 0.9 % IJ SOLN: 10.0000 mL | INTRAMUSCULAR | Status: DC | PRN

## 2015-03-05 MED ORDER — SODIUM CHLORIDE 0.9 % IJ SOLN
9.0000 mL | INTRAMUSCULAR | Status: DC | PRN
  Administered 2015-03-05: 9 mL via INTRAVENOUS

## 2015-03-05 MED ORDER — SODIUM CHLORIDE 0.9 % IV SOLN
12.5000 mg | Freq: Four times a day (QID) | INTRAVENOUS | Status: DC | PRN
  Administered 2015-03-05: 12.5 mg via INTRAVENOUS
  Filled 2015-03-05 (×3): qty 0.25

## 2015-03-05 MED ORDER — HEPARIN SOD (PORK) LOCK FLUSH 100 UNIT/ML IV SOLN
500.0000 [IU] | INTRAVENOUS | Status: DC | PRN
  Administered 2015-03-05: 500 [IU]
  Filled 2015-03-05: qty 5

## 2015-03-05 MED ORDER — OXYCODONE HCL 5 MG PO TABS
5.0000 mg | ORAL_TABLET | Freq: Once | ORAL | Status: AC
  Administered 2015-03-05: 5 mg via ORAL
  Filled 2015-03-05: qty 1

## 2015-03-05 MED ORDER — DIPHENHYDRAMINE HCL 12.5 MG/5ML PO ELIX: 12.5000 mg | ORAL_SOLUTION | Freq: Four times a day (QID) | ORAL | Status: DC | PRN

## 2015-03-05 MED ORDER — OXYCODONE-ACETAMINOPHEN 5-325 MG PO TABS
1.0000 | ORAL_TABLET | Freq: Once | ORAL | Status: AC
  Administered 2015-03-05: 1 via ORAL
  Filled 2015-03-05: qty 1

## 2015-03-05 MED ORDER — NALOXONE HCL 0.4 MG/ML IJ SOLN: 0.4000 mg | INTRAMUSCULAR | Status: DC | PRN

## 2015-03-05 MED ORDER — ONDANSETRON HCL 4 MG/2ML IJ SOLN: 4.0000 mg | Freq: Four times a day (QID) | INTRAMUSCULAR | Status: DC | PRN

## 2015-03-05 MED ORDER — HYDROMORPHONE 1 MG/ML IV SOLN: INTRAVENOUS | Status: DC

## 2015-03-05 MED ORDER — HYDROMORPHONE 1 MG/ML IV SOLN
INTRAVENOUS | Status: DC
  Administered 2015-03-05: 2 mg via INTRAVENOUS
  Administered 2015-03-05: 10:00:00 via INTRAVENOUS
  Administered 2015-03-05: 23.2 mg via INTRAVENOUS
  Filled 2015-03-05: qty 25

## 2015-03-05 MED ORDER — KETOROLAC TROMETHAMINE 30 MG/ML IJ SOLN
30.0000 mg | Freq: Once | INTRAMUSCULAR | Status: AC
  Administered 2015-03-05: 30 mg via INTRAVENOUS
  Filled 2015-03-05: qty 1

## 2015-03-05 MED ORDER — DEXTROSE-NACL 5-0.45 % IV SOLN
INTRAVENOUS | Status: DC
  Administered 2015-03-05: 10:00:00 via INTRAVENOUS

## 2015-03-05 NOTE — Telephone Encounter (Signed)
Refill request for Percocet 10/325mg. LOV 01/08/2015 with Jegede. Please advise. Thanks!  

## 2015-03-05 NOTE — Telephone Encounter (Signed)
Pt was called and told that she could come to the Day hospital for treatment. Pt was also told that she needed to make a follow up appt for her medications. Pt stated that she would do that when she checks in.

## 2015-03-05 NOTE — H&P (Signed)
[facility] History and Physical   Date: 03/05/2015  Patient name: Brittany Foster Medical record number: [id number] Date of birth: [birth year] Age: 30 y.o. Gender: female PCP: Jeanann Lewandowsky, MD  Attending physician: Quentin Angst, MD  Chief Complaint: Generalized  History of Present Illness:  Brittany Foster, a 29 year old female with a history of sickle cell anemia, HbSS presents with left rib and low back. She describes current pain intensity as constant and sharp. She attributes current pain intensity to changes in weather.  Patient was advised several weeks ago to make a follow up appointment with primary provider for chronic pain. Brittany Foster has an appointment to discuss medication regimen and chronic pain with Dr. Hyman Hopes. She states that Belbuca, long acting pain medication is no longer effective in controlling pain.  Current pain intensity is 9/10. She states that she had Percocet 10-325 mg and Aleeve around 5:30 am with minimal relief. She maintains that she is taking all other medications consistently. Patient denies fatigue, headache, shortness of breath, urinary problems, nausea, vomiting, or diarrhea.   Meds: Prescriptions prior to admission  Medication Sig Dispense Refill Last Dose  . Buprenorphine HCl (BELBUCA) 75 MCG FILM Place 75 mcg inside cheek 2 (two) times daily. 60 each 0 Past Week at Unknown time  . Cholecalciferol (VITAMIN D3) 5000 UNITS CAPS Take 1 capsule (5,000 Units total) by mouth daily. 30 capsule 5 Unknown at Unknown time  . DULoxetine (CYMBALTA) 60 MG capsule Take 1 capsule (60 mg total) by mouth daily. 30 capsule 3 02/25/2015 at Unknown time  . folic acid (FOLVITE) 1 MG tablet Take 1 tablet (1 mg total) by mouth daily. 90 tablet 11 02/25/2015 at Unknown time  . gabapentin (NEURONTIN) 300 MG capsule Take 1 capsule (300 mg total) by mouth 3 (three) times daily. 90 capsule 2 02/25/2015 at Unknown time  . hydroxyurea (HYDREA) 500  MG capsule Take 2 capsules (1,000 mg total) by mouth daily. May take with food to minimize GI side effects. 60 capsule 3 02/25/2015 at Unknown time  . oxyCODONE-acetaminophen (PERCOCET) 10-325 MG tablet Take 1 tablet by mouth every 6 (six) hours as needed for pain. No Refill before 02/07/2015 120 tablet 0 02/26/2015 at Unknown time  . potassium chloride SA (K-DUR,KLOR-CON) 20 MEQ tablet Take 1 tablet (20 mEq total) by mouth daily. 30 tablet 2 Past Week at Unknown time  . Topiramate ER 100 MG CP24 Take 100 mg by mouth at bedtime.   Past Week at Unknown time    Allergies: Ultram and Morphine and related Past Medical History  Diagnosis Date  . Sickle cell anemia [facility])    Past Surgical History  Procedure Laterality Date  . Tubal ligation    . Cholecystectomy    . Cesarean section    . Port a cath placement Right     about 6-7 years ago  . Cholecystectomy  2000   Family History  Problem Relation Age of Onset  . Sickle cell anemia Other   . Sickle cell trait Father   . Sickle cell trait Mother    Social History   Social History  . Marital Status: Single    Spouse Name: N/A  . Number of Children: N/A  . Years of Education: N/A   Occupational History  . Not on file.   Social History Main Topics  . Smoking status: Former Games developer  . Smokeless tobacco: Not on file  Comment: smokes once every couple of weeks.   . Alcohol Use: No  . Drug Use: No  . Sexual Activity: Yes    Birth Control/ Protection: Surgical   Other Topics Concern  . Not on file   Social History Narrative    Review of Systems: Constitutional: negative for fatigue and fevers Eyes: negative Ears, nose, mouth, throat, and face: negative Respiratory: negative for cough, dyspnea on exertion and wheezing Cardiovascular: negative for chest pain, fatigue, orthopnea and tachypnea Gastrointestinal: negative for constipation, diarrhea and nausea Genitourinary:negative Integument/breast:  negative Hematologic/lymphatic: negative Musculoskeletal:negative for myalgias Neurological: negative Behavioral/Psych: negative Endocrine: negative Allergic/Immunologic: negative  Physical Exam: Last menstrual period 02/15/2015. LMP 02/15/2015  General Appearance:    Alert, cooperative, mild distress, appears stated age  Head:    Normocephalic, without obvious abnormality, atraumatic  Eyes:    PERRL, conjunctiva/corneas clear, EOM's intact, fundi    benign, both eyes  Ears:    Normal TM's and external ear canals, both ears  Nose:   Nares normal, septum midline, mucosa normal, no drainage    or sinus tenderness  Back:     Symmetric, no curvature, ROM normal, no CVA tenderness  Lungs:     Clear to auscultation bilaterally, respirations unlabored  Chest Wall:    No tenderness or deformity   Heart:    Regular rate and rhythm, S1 and S2 normal, no murmur, rub   or gallop  Abdomen:     Soft, non-tender, bowel sounds active all four quadrants,    no masses, no organomegaly  Extremities:   Extremities normal, atraumatic, no cyanosis or edema  Pulses:   2+ and symmetric all extremities  Skin:   Skin color, texture, turgor normal, no rashes or lesions  Lymph nodes:   Cervical, supraclavicular, and axillary nodes normal  Neurologic:   CNII-XII intact, normal strength, sensation and reflexes    throughout    Lab results: No results found for this or any previous visit (from the past 24 hour(s)).  Imaging results:  No results found.   Assessment & Plan:  Patient will be admitted to the day infusion center for extended observation  Start IV D5.45 for cellular rehydration at 100/hr  Start Toradol 30 mg IV every 6 hours for inflammation.  Start Dilaudid PCA High Concentration per weight based protocol.   Patient will be re-evaluated for pain intensity in the context of function and     relationship to  baseline pain intensity as care progresses.  If no significant pain relief,  will transfer patient to inpatient services for a          higher level of care.   Will review CBCw/d, reticulocytes, and CMP.    Crytal Pensinger M 03/05/2015, 9:24 AM

## 2015-03-05 NOTE — Progress Notes (Signed)
[facility]. Pt stated her pain was 9/10 and down at discharge. Pt received IV fluids, PCA, Toradol and Benadryl for treatment. Pt enc to use the IS to help with her take deep breathe. Pt was discharged to home with family. Pt was alert, oriented and ambulatory. Discharge instructions given to her with verbal understanding.

## 2015-03-05 NOTE — Discharge Summary (Signed)
[facility] Discharge Summary   Patient ID: Brittany Foster MRN: [id number] DOB/AGE: [birth year] 30 y.o.  Admit date: 03/05/2015 Discharge date: 03/05/2015  Primary Care Physician:  Jeanann Lewandowsky, MD  Admission Diagnoses:  Active Problems:   Sickle cell anemia with pain [facility])  Discharge Medications:    Medication List    ASK your doctor about these medications        Buprenorphine HCl 75 MCG Film  Commonly known as:  BELBUCA  Place 75 mcg inside cheek 2 (two) times daily.     DULoxetine 60 MG capsule  Commonly known as:  CYMBALTA  Take 1 capsule (60 mg total) by mouth daily.     folic acid 1 MG tablet  Commonly known as:  FOLVITE  Take 1 tablet (1 mg total) by mouth daily.     gabapentin 300 MG capsule  Commonly known as:  NEURONTIN  Take 1 capsule (300 mg total) by mouth 3 (three) times daily.     hydroxyurea 500 MG capsule  Commonly known as:  HYDREA  Take 2 capsules (1,000 mg total) by mouth daily. May take with food to minimize GI side effects.     oxyCODONE-acetaminophen 10-325 MG tablet  Commonly known as:  PERCOCET  Take 1 tablet by mouth every 6 (six) hours as needed for pain. No Refill before 02/07/2015     potassium chloride SA 20 MEQ tablet  Commonly known as:  K-DUR,KLOR-CON  Take 1 tablet (20 mEq total) by mouth daily.     Topiramate ER 100 MG Cp24  Take 100 mg by mouth at bedtime.     Vitamin D3 5000 units Caps  Take 1 capsule (5,000 Units total) by mouth daily.         Consults:  None  Significant Diagnostic Studies:  No results found.   [facility] Course: -Brittany Foster was admitted to the day infusion center for extended observation.  -Started D5.45 at 125 ml per hour for cellular rehydration.  -Toradol 30 mg IV times one for inflammation  -Potassium was mildly decreased at 3.2, will check labs at follow up appointment. [name] advised to continue home Potassium 20 MEQ daily.  All other labs consistent with baseline.   -Patient was started on a high concentration dilaudid PCA. She used a total of 23.2 mg with 44 demands and 43 deliveries. Patient was given a clinician assisted dosage of 2 mg.  She reports that pain intensity is  7/10. She received Percocet 10-325 mg 30 minutes prior to discharge. Patient can manage at home on current medication regimen.  -She is alert, oriented, and ambulatory. Using incentive spirometer on demand. Oxygen saturation, 96%, respirations 14.   -Patient is to follow up in office as scheduled with Dr. Hyman Hopes. If symptoms continue to persist, follow up in day hospital on 03/05/2014.  - Recommend that she continue rest and increase hydration to 64 ounce of water every other hour.    Physical Exam at Discharge: BP 102/65 mmHg  Pulse 69  Temp(Src) 98.4 F (36.9 C) (Oral)  Resp 12  Ht 5' (1.524 m)  Wt 112 lb (50.803 kg)  BMI 21.87 kg/m2  SpO2 98%  LMP 02/15/2015  General Appearance:    Alert, cooperative, mild distress, appears stated age  Head:    Normocephalic, without obvious abnormality, atraumatic  Eyes:    PERRL, conjunctiva/corneas clear, EOM's intact, fundi    benign, both  eyes  Back:     Symmetric, no curvature, ROM normal, no CVA tenderness  Lungs:     Clear to auscultation bilaterally, respirations unlabored  Chest Wall:    No tenderness or deformity   Heart:    Regular rate and rhythm, S1 and S2 normal, no murmur, rub   or gallop  Extremities:   Extremities normal, atraumatic, no cyanosis or edema  Pulses:   2+ and symmetric all extremities  Skin:   Skin color, texture, turgor normal, no rashes or lesions  Lymph nodes:   Cervical, supraclavicular, and axillary nodes normal  Neurologic:   CNII-XII intact, normal strength, sensation and reflexes    throughout   Disposition at Discharge: 01-Home or Self Care  Discharge Orders:   Condition at Discharge:   Stable  Time spent on Discharge:  15  minutes  Signed: Orlo Brickle M 03/05/2015, 4:16 PM

## 2015-03-05 NOTE — Telephone Encounter (Signed)
Patient called requesting to [facility]. Stated her pain was 9/10. Had her last pain med at 530am, scheduled and prn. Pt states her pain is in her rib cage, legs, back and hips. Told her that we would check with the provider C.Hart RochesterHollis, NP and call her back. Pt voiced understanding.

## 2015-03-06 ENCOUNTER — Telehealth (HOSPITAL_COMMUNITY): Payer: Self-pay | Admitting: *Deleted

## 2015-03-07 ENCOUNTER — Encounter (HOSPITAL_COMMUNITY): Payer: Self-pay | Admitting: *Deleted

## 2015-03-07 ENCOUNTER — Emergency Department (HOSPITAL_COMMUNITY): Payer: Medicaid Other

## 2015-03-07 ENCOUNTER — Telehealth (HOSPITAL_COMMUNITY): Payer: Self-pay

## 2015-03-07 ENCOUNTER — Inpatient Hospital Stay (HOSPITAL_COMMUNITY)
Admission: EM | Admit: 2015-03-07 | Discharge: 2015-03-13 | DRG: 812 | Disposition: A | Discharge status: Home / Self Care | Payer: Medicaid Other | Attending: Internal Medicine | Admitting: Internal Medicine

## 2015-03-07 DIAGNOSIS — Z885 Allergy status to narcotic agent status: Secondary | ICD-10-CM

## 2015-03-07 DIAGNOSIS — D5700 Hb-SS disease with crisis, unspecified: Secondary | ICD-10-CM

## 2015-03-07 DIAGNOSIS — R52 Pain, unspecified: Secondary | ICD-10-CM | POA: Diagnosis not present

## 2015-03-07 DIAGNOSIS — M25559 Pain in unspecified hip: Secondary | ICD-10-CM

## 2015-03-07 DIAGNOSIS — M25552 Pain in left hip: Secondary | ICD-10-CM | POA: Diagnosis present

## 2015-03-07 DIAGNOSIS — R072 Precordial pain: Secondary | ICD-10-CM | POA: Diagnosis not present

## 2015-03-07 DIAGNOSIS — D72829 Elevated white blood cell count, unspecified: Secondary | ICD-10-CM | POA: Diagnosis present

## 2015-03-07 DIAGNOSIS — Z79899 Other long term (current) drug therapy: Secondary | ICD-10-CM

## 2015-03-07 DIAGNOSIS — D638 Anemia in other chronic diseases classified elsewhere: Secondary | ICD-10-CM | POA: Diagnosis present

## 2015-03-07 DIAGNOSIS — Z9109 Other allergy status, other than to drugs and biological substances: Secondary | ICD-10-CM

## 2015-03-07 DIAGNOSIS — Z888 Allergy status to other drugs, medicaments and biological substances status: Secondary | ICD-10-CM

## 2015-03-07 DIAGNOSIS — Z87891 Personal history of nicotine dependence: Secondary | ICD-10-CM

## 2015-03-07 DIAGNOSIS — Z9049 Acquired absence of other specified parts of digestive tract: Secondary | ICD-10-CM

## 2015-03-07 DIAGNOSIS — G894 Chronic pain syndrome: Secondary | ICD-10-CM | POA: Diagnosis present

## 2015-03-07 DIAGNOSIS — R079 Chest pain, unspecified: Secondary | ICD-10-CM | POA: Diagnosis present

## 2015-03-07 HISTORY — DX: Anemia, unspecified: D64.9

## 2015-03-07 LAB — BASIC METABOLIC PANEL
ANION GAP: 9 (ref 5–15)
BUN: 6 mg/dL (ref 6–20)
CO2: 21 mmol/L — AB (ref 22–32)
Calcium: 9.4 mg/dL (ref 8.9–10.3)
Chloride: 110 mmol/L (ref 101–111)
Creatinine, Ser: 0.39 mg/dL — ABNORMAL LOW (ref 0.44–1.00)
GFR calc Af Amer: >60 mL/min (ref >60)
GLUCOSE: 114 mg/dL — AB (ref 65–99)
POTASSIUM: 3.5 mmol/L (ref 3.5–5.1)
Sodium: 140 mmol/L (ref 135–145)

## 2015-03-07 LAB — CBC
HEMATOCRIT: 25.4 % — AB (ref 36.0–46.0)
HEMOGLOBIN: 8.7 g/dL — AB (ref 12.0–15.0)
MCH: 35.7 pg — AB (ref 26.0–34.0)
MCHC: 34.3 g/dL (ref 30.0–36.0)
MCV: 104.1 fL — ABNORMAL HIGH (ref 78.0–100.0)
Platelets: 317 10*3/uL (ref 150–400)
RBC: 2.44 MIL/uL — ABNORMAL LOW (ref 3.87–5.11)
RDW: 18.5 % — ABNORMAL HIGH (ref 11.5–15.5)
WBC: 11.1 10*3/uL — ABNORMAL HIGH (ref 4.0–10.5)

## 2015-03-07 LAB — RETICULOCYTES
RBC.: 2.59 MIL/uL — ABNORMAL LOW (ref 3.87–5.11)
RETIC COUNT ABSOLUTE: 440.3 10*3/uL — AB (ref 19.0–186.0)
Retic Ct Pct: 17 % — ABNORMAL HIGH (ref 0.4–3.1)

## 2015-03-07 LAB — I-STAT TROPONIN, ED: Troponin i, poc: 0 ng/mL (ref 0.00–0.08)

## 2015-03-07 MED ORDER — FAMOTIDINE IN NACL 20-0.9 MG/50ML-% IV SOLN
20.0000 mg | Freq: Two times a day (BID) | INTRAVENOUS | Status: DC
  Filled 2015-03-07 (×8): qty 50

## 2015-03-07 MED ORDER — HYDROMORPHONE HCL 1 MG/ML IJ SOLN: 1.0000 mg | Freq: Once | INTRAMUSCULAR | Status: DC

## 2015-03-07 MED ORDER — SENNOSIDES-DOCUSATE SODIUM 8.6-50 MG PO TABS
1.0000 | ORAL_TABLET | Freq: Two times a day (BID) | ORAL | Status: DC
  Administered 2015-03-11: 1 via ORAL
  Filled 2015-03-07 (×7): qty 1

## 2015-03-07 MED ORDER — POLYETHYLENE GLYCOL 3350 17 G PO PACK: 17.0000 g | PACK | Freq: Every day | ORAL | Status: DC | PRN

## 2015-03-07 MED ORDER — KETOROLAC TROMETHAMINE 30 MG/ML IJ SOLN
30.0000 mg | Freq: Once | INTRAMUSCULAR | Status: AC
  Administered 2015-03-07: 30 mg via INTRAVENOUS
  Filled 2015-03-07: qty 1

## 2015-03-07 MED ORDER — DULOXETINE HCL 60 MG PO CPEP
60.0000 mg | ORAL_CAPSULE | Freq: Every day | ORAL | Status: DC
  Administered 2015-03-08 – 2015-03-13 (×6): 60 mg via ORAL
  Filled 2015-03-07 (×6): qty 1

## 2015-03-07 MED ORDER — HYDROMORPHONE HCL 1 MG/ML IJ SOLN
2.0000 mg | Freq: Once | INTRAMUSCULAR | Status: AC
  Administered 2015-03-07: 2 mg via INTRAVENOUS
  Filled 2015-03-07: qty 2

## 2015-03-07 MED ORDER — HEPARIN SODIUM (PORCINE) 5000 UNIT/ML IJ SOLN
5000.0000 [IU] | Freq: Three times a day (TID) | INTRAMUSCULAR | Status: DC
  Administered 2015-03-08 – 2015-03-11 (×5): 5000 [IU] via SUBCUTANEOUS
  Filled 2015-03-07 (×14): qty 1

## 2015-03-07 MED ORDER — HYDROCODONE-ACETAMINOPHEN 5-325 MG PO TABS
1.0000 | ORAL_TABLET | ORAL | Status: DC | PRN
  Filled 2015-03-07: qty 2

## 2015-03-07 MED ORDER — HYDROMORPHONE HCL 1 MG/ML IJ SOLN
1.0000 mg | Freq: Once | INTRAMUSCULAR | Status: AC
  Administered 2015-03-07: 1 mg via INTRAVENOUS
  Filled 2015-03-07: qty 1

## 2015-03-07 MED ORDER — TOPIRAMATE 25 MG PO TABS
50.0000 mg | ORAL_TABLET | Freq: Two times a day (BID) | ORAL | Status: DC
  Administered 2015-03-08 – 2015-03-13 (×11): 50 mg via ORAL
  Filled 2015-03-07 (×13): qty 2

## 2015-03-07 MED ORDER — KETOROLAC TROMETHAMINE 30 MG/ML IJ SOLN
30.0000 mg | Freq: Four times a day (QID) | INTRAMUSCULAR | Status: AC
  Administered 2015-03-08 – 2015-03-12 (×15): 30 mg via INTRAVENOUS
  Filled 2015-03-07 (×21): qty 1

## 2015-03-07 MED ORDER — FAMOTIDINE 20 MG PO TABS
20.0000 mg | ORAL_TABLET | Freq: Two times a day (BID) | ORAL | Status: DC
  Administered 2015-03-10 – 2015-03-12 (×2): 20 mg via ORAL
  Filled 2015-03-07 (×12): qty 1

## 2015-03-07 MED ORDER — HYDROMORPHONE HCL 1 MG/ML IJ SOLN
2.0000 mg | INTRAMUSCULAR | Status: DC | PRN
  Administered 2015-03-07 – 2015-03-08 (×5): 2 mg via INTRAVENOUS
  Filled 2015-03-07 (×5): qty 2

## 2015-03-07 MED ORDER — DIPHENHYDRAMINE HCL 25 MG PO CAPS
25.0000 mg | ORAL_CAPSULE | ORAL | Status: DC | PRN
  Filled 2015-03-07: qty 1

## 2015-03-07 MED ORDER — PROMETHAZINE HCL 25 MG/ML IJ SOLN
25.0000 mg | Freq: Once | INTRAMUSCULAR | Status: AC
  Administered 2015-03-07: 25 mg via INTRAVENOUS
  Filled 2015-03-07: qty 1

## 2015-03-07 MED ORDER — HYDROXYUREA 500 MG PO CAPS
1000.0000 mg | ORAL_CAPSULE | Freq: Every day | ORAL | Status: DC
  Administered 2015-03-08 – 2015-03-13 (×6): 1000 mg via ORAL
  Filled 2015-03-07 (×6): qty 2

## 2015-03-07 MED ORDER — DEXTROSE-NACL 5-0.45 % IV SOLN
INTRAVENOUS | Status: DC
  Administered 2015-03-08 – 2015-03-13 (×7): via INTRAVENOUS

## 2015-03-07 MED ORDER — SODIUM CHLORIDE 0.9 % IV SOLN
25.0000 mg | INTRAVENOUS | Status: DC | PRN
  Administered 2015-03-08 – 2015-03-11 (×14): 25 mg via INTRAVENOUS
  Filled 2015-03-07 (×30): qty 0.5

## 2015-03-07 MED ORDER — SODIUM CHLORIDE 0.9 % IV BOLUS (SEPSIS)
1000.0000 mL | Freq: Once | INTRAVENOUS | Status: AC
  Administered 2015-03-07: 1000 mL via INTRAVENOUS

## 2015-03-07 MED ORDER — DIPHENHYDRAMINE HCL 50 MG/ML IJ SOLN
25.0000 mg | Freq: Once | INTRAMUSCULAR | Status: AC
  Administered 2015-03-07: 25 mg via INTRAVENOUS
  Filled 2015-03-07: qty 1

## 2015-03-07 MED ORDER — ONDANSETRON HCL 4 MG/2ML IJ SOLN
4.0000 mg | Freq: Once | INTRAMUSCULAR | Status: DC
  Filled 2015-03-07: qty 2

## 2015-03-07 MED ORDER — FOLIC ACID 1 MG PO TABS
1.0000 mg | ORAL_TABLET | Freq: Every day | ORAL | Status: DC
  Administered 2015-03-08 – 2015-03-13 (×6): 1 mg via ORAL
  Filled 2015-03-07 (×6): qty 1

## 2015-03-07 NOTE — H&P (Signed)
[facility] History and Physical  Brittany CampbellMiranda Igoe NFA:[id number] DOB: 03/14/1985 DOA: 03/07/2015  Referring physician: ED physician PCP: Jeanann LewandowskyJEGEDE, OLUGBEMIGA, MD  Specialists:   Chief Complaint: Chest, hip, leg pain   HPI: Brittany Foster is a 30 y.o. female with PMH of sickle cell disease presenting with pain in the chest, bilateral hips, and bilateral legs, not adequately managed with her home regimen. The patient had just recovered from a recent pain crisis, being discharged from the hospital on 03/05/2015, when she developed acute chest pain at rest the following day. The pain was acute in onset, severe in intensity, sharp in character, constant, with no relieving or exacerbating factors identified by the patient, and eventually subsiding without any specific intervention. There was associated dyspnea, but no nausea or diaphoresis. Symptoms have returned and are waxing and waning. Over the past day, the patient has developed bilateral hip and leg pain as well. She denies any recent fever, chills, cough, nausea, vomiting, or diarrhea.   In ED, patient was found to be afebrile and saturating well on room air with vital signs stable. Troponin was undetectable and EKG featured in normal sinus rhythm without any ischemic changes. Chest x-ray was negative for any acute cardiopulmonary disease. Labs revealed a stable anemia and a new, mild leukocytosis to 11,000. Patient was given IV pushes of Dilaudid in the emergency department with temporary relief. She remained hemodynamically stable and the hospitalists were asked to admit for ongoing evaluation and management.  Where does patient live?   At home    Can patient participate in ADLs?  Yes        Review of Systems:   General: no fevers, chills, sweats, weight change, poor appetite, or fatigue HEENT: no blurry vision, hearing changes or sore throat Pulm:  Dyspnea, but no cough, or wheeze CV:   chest pain, but  no palpitations Abd: no nausea,  vomiting, abdominal pain, diarrhea, or constipation GU: no dysuria, hematuria, increased urinary frequency, or urgency  Ext: no leg edema; b/l leg and hip pain Neuro: no focal weakness, numbness, or tingling, no vision change or hearing loss Skin: no rash, no wounds MSK: No muscle spasm, no deformity, no red, hot, or swollen joint Heme: No easy bruising or bleeding Travel history: No recent long distant travel    Allergy:  Allergies  Allergen Reactions  . Ultram [Tramadol] Other (See Comments)    seizures  . Zofran [Ondansetron]   . Morphine And Related Hives, Rash and Other (See Comments)    Shaking Tolerates Percocet, Norco, and buprenorphine  . Tape Rash    Past Medical History  Diagnosis Date  . Sickle cell anemia (HCC)   . Anemia     Past Surgical History  Procedure Laterality Date  . Tubal ligation    . Cholecystectomy    . Cesarean section    . Port a cath placement Right     about 6-7 years ago  . Cholecystectomy  2000    Social History:  reports that she has quit smoking. She does not have any smokeless tobacco history on file. She reports that she does not drink alcohol or use illicit drugs.  Family History:  Family History  Problem Relation Age of Onset  . Sickle cell anemia Other   . Sickle cell trait Father   . Sickle cell trait Mother      Prior to Admission medications   Medication Sig Start Date End Date Taking? Authorizing Provider  acetaminophen (TYLENOL) 325 MG tablet  Take 650 mg by mouth every 6 (six) hours as needed for mild pain.   Yes Historical Provider, MD  Buprenorphine HCl (BELBUCA) 75 MCG FILM Place 75 mcg inside cheek 2 (two) times daily. 01/08/15  Yes Quentin Angst, MD  DULoxetine (CYMBALTA) 60 MG capsule Take 1 capsule (60 mg total) by mouth daily. 01/08/15  Yes Quentin Angst, MD  folic acid (FOLVITE) 1 MG tablet Take 1 tablet (1 mg total) by mouth daily. 01/08/15  Yes Quentin Angst, MD  hydroxyurea (HYDREA) 500 MG  capsule Take 2 capsules (1,000 mg total) by mouth daily. May take with food to minimize GI side effects. 11/09/14  Yes [name], FNP  ibuprofen (ADVIL,MOTRIN) 200 MG tablet Take 200 mg by mouth every 6 (six) hours as needed for moderate pain.   Yes Historical Provider, MD  oxyCODONE-acetaminophen (PERCOCET) 10-325 MG tablet Take 1 tablet by mouth every 6 (six) hours as needed for pain. No Refill before 02/07/2015 02/05/15  Yes Olugbemiga E Hyman Hopes, MD  potassium chloride SA (K-DUR,KLOR-CON) 20 MEQ tablet Take 1 tablet (20 mEq total) by mouth daily. 01/08/15  Yes Quentin Angst, MD  Topiramate ER 100 MG CP24 Take 100 mg by mouth at bedtime.   Yes Historical Provider, MD  Cholecalciferol (VITAMIN D3) 5000 UNITS CAPS Take 1 capsule (5,000 Units total) by mouth daily. Patient not taking: Reported on 03/07/2015 01/08/15   Quentin Angst, MD  gabapentin (NEURONTIN) 300 MG capsule Take 1 capsule (300 mg total) by mouth 3 (three) times daily. Patient not taking: Reported on 03/07/2015 02/14/14   Altha Harm, MD    Physical Exam: Filed Vitals:   03/07/15 2115 03/07/15 2130 03/07/15 2145 03/07/15 2200  BP: 105/64 101/59 106/65 106/68  Pulse: 93 91 89 92  Temp:      TempSrc:      Resp:      Height:      Weight:      SpO2: 100% 100% 99% 98%   General: Not in acute distress HEENT:       Eyes: PERRL, EOMI, no scleral icterus or conjunctival pallor.       ENT: No discharge from the ears or nose, no pharyngeal ulcers, petechiae or exudate, no tonsillar enlargement.        Neck: No JVD, no bruit, no appreciable mass Heme: No cervical adenopathy, no pallor Cardiac: S1/S2, RRR, No murmurs, No gallops or rubs. Pulm: Good air movement bilaterally. No rales, wheezing, rhonchi or rubs. Abd: Soft, nondistended, nontender, no rebound pain or gaurding, no mass or organomegaly, BS present. Ext: No LE edema bilaterally. 2+DP/PT pulse bilaterally. Musculoskeletal: No gross deformity, no red, hot,  swollen joints, LE ROM limited by pain, LEs tender throughout   Skin: No rashes or wounds on exposed surfaces  Neuro: Alert, oriented X3, cranial nerves II-XII grossly intact. No focal findings Psych: Patient is not overtly psychotic, denies suicidal or homocidal ideation, no active hallucinations.  Labs on Admission:  Basic Metabolic Panel:  Recent Labs Lab 03/05/15 1000 03/07/15 1243  NA 141 140  K 3.2* 3.5  CL 110 110  CO2 23 21*  GLUCOSE 111* 114*  BUN 7 6  CREATININE 0.41* 0.39*  CALCIUM 8.8* 9.4   Liver Function Tests:  Recent Labs Lab 03/05/15 1000  AST 53*  ALT 37  ALKPHOS 89  BILITOT 2.0*  PROT 7.1  ALBUMIN 4.0   No results for input(s): LIPASE, AMYLASE in the last 168 hours. No  results for input(s): AMMONIA in the last 168 hours. CBC:  Recent Labs Lab 03/05/15 1000 03/07/15 1243  WBC 9.8 11.1*  NEUTROABS 5.6  --   HGB 8.4* 8.7*  HCT 24.8* 25.4*  MCV 106.0* 104.1*  PLT 299 317   Cardiac Enzymes: No results for input(s): CKTOTAL, CKMB, CKMBINDEX, TROPONINI in the last 168 hours.  BNP (last 3 results)  Recent Labs  03/18/14 1811 04/29/14 1459  BNP 78.1 114.5*    ProBNP (last 3 results) No results for input(s): PROBNP in the last 8760 hours.  CBG: No results for input(s): GLUCAP in the last 168 hours.  Radiological Exams on Admission: Dg Chest 2 View  03/07/2015  CLINICAL DATA:  Chest pain with Short of breath.  Sickle cell. EXAM: CHEST  2 VIEW COMPARISON:  03/03/2015 FINDINGS: Port-A-Cath tip in the right atrium unchanged Heart size normal. No heart failure. Lungs are clear without infiltrate effusion or mass. No change from the prior study. IMPRESSION: No active cardiopulmonary disease. Electronically Signed   By: Marlan Palau M.D.   On: 03/07/2015 13:22    EKG: Independently reviewed.  Abnormal findings:   Rate 100, NSR    Assessment/Plan  1. Sickle cell disease with crisis  - Treating with Dilaudid IV pushes prn, may need PCA if  not able to control  - Gently hydrating with d5-1/2 NS - Acute chest syndrome considered, but not supported by the imaging and exam  - Will continue to support pt, plan for transfer to [facility]    2. Chest pain  - Reproducible with palpation  - EKG and troponin are reassuring  - Monitoring on telemetry  - CXR negative, low suspicion for acute chest at this time  - Pain-management as above  3. Leukocytosis  - Uncertain etiology  - No other suggestion of infection identified  - Watching off antibiotics    DVT ppx:  SQ Lovenox    Code Status: Full code Family Communication: None at bed side.          Disposition Plan: Admit to inpatient   Date of Service 03/07/2015    Briscoe Deutscher, MD [facility] Pager [phone]  If 7PM-7AM, please contact night-coverage [email or web address] Password [facility] 03/07/2015, 11:34 PM

## 2015-03-07 NOTE — ED Notes (Signed)
PA at bedside.

## 2015-03-07 NOTE — ED Notes (Signed)
Accessed port in R chest.

## 2015-03-07 NOTE — ED Provider Notes (Signed)
CSN: [id number]     Arrival date & time 03/07/15  1222 History   First MD Initiated Contact with Patient 03/07/15 1812     Chief Complaint  Patient presents with  . Chest Pain    HPI   Brittany Foster is an 30 y.o. female with history of sickle cell who presents to the ED for evaluation of chest pain. Pt was last seen in the [facility] two days ago. She states that she is on percocet 10's and Belbuca at home for baseline pain in her back and legs. However, last night she was woken from sleep with sharp chest pain that started along her sternum and extended under her left breast. She states that she has been taking her home meds but the pain is getting progressively worse. She states the pain is getting so bad that it is hard to breathe. She states she called the sickle cell clinic who told her to come to the ED as the doctor was apparently not available today. Pt states she recently changed from a chronic pain clinic to the sickle cell center for ongoing care. She denies fever, chills, n/v/d. In the room she appears uncomfortable but is not working hard to breathe, is not hypoxic.   Past Medical History  Diagnosis Date  . Sickle cell anemia [facility])    Past Surgical History  Procedure Laterality Date  . Tubal ligation    . Cholecystectomy    . Cesarean section    . Port a cath placement Right     about 6-7 years ago  . Cholecystectomy  2000   Family History  Problem Relation Age of Onset  . Sickle cell anemia Other   . Sickle cell trait Father   . Sickle cell trait Mother    Social History  Substance Use Topics  . Smoking status: Former Games developer  . Smokeless tobacco: None     Comment: smokes once every couple of weeks.   . Alcohol Use: No   OB History    No data available     Review of Systems  All other systems reviewed and are negative.     Allergies  Ultram; Zofran; Morphine and related; and Tape  Home Medications   Prior to Admission medications   Medication  Sig Start Date End Date Taking? Authorizing Provider  acetaminophen (TYLENOL) 325 MG tablet Take 650 mg by mouth every 6 (six) hours as needed for mild pain.   Yes Historical Provider, MD  Buprenorphine HCl (BELBUCA) 75 MCG FILM Place 75 mcg inside cheek 2 (two) times daily. 01/08/15  Yes Quentin Angst, MD  DULoxetine (CYMBALTA) 60 MG capsule Take 1 capsule (60 mg total) by mouth daily. 01/08/15  Yes Quentin Angst, MD  folic acid (FOLVITE) 1 MG tablet Take 1 tablet (1 mg total) by mouth daily. 01/08/15  Yes Quentin Angst, MD  hydroxyurea (HYDREA) 500 MG capsule Take 2 capsules (1,000 mg total) by mouth daily. May take with food to minimize GI side effects. 11/09/14  Yes [name], FNP  ibuprofen (ADVIL,MOTRIN) 200 MG tablet Take 200 mg by mouth every 6 (six) hours as needed for moderate pain.   Yes Historical Provider, MD  oxyCODONE-acetaminophen (PERCOCET) 10-325 MG tablet Take 1 tablet by mouth every 6 (six) hours as needed for pain. No Refill before 02/07/2015 02/05/15  Yes Olugbemiga E Hyman Hopes, MD  potassium chloride SA (K-DUR,KLOR-CON) 20 MEQ tablet Take 1 tablet (20 mEq total) by mouth  daily. 01/08/15  Yes Quentin Angst, MD  Topiramate ER 100 MG CP24 Take 100 mg by mouth at bedtime.   Yes Historical Provider, MD  Cholecalciferol (VITAMIN D3) 5000 UNITS CAPS Take 1 capsule (5,000 Units total) by mouth daily. Patient not taking: Reported on 03/07/2015 01/08/15   Quentin Angst, MD  gabapentin (NEURONTIN) 300 MG capsule Take 1 capsule (300 mg total) by mouth 3 (three) times daily. Patient not taking: Reported on 03/07/2015 02/14/14   Altha Harm, MD   BP 112/75 mmHg  Pulse 91  Temp(Src) 98.5 F (36.9 C) (Oral)  Resp 18  Ht 5' (1.524 m)  Wt 50.803 kg  BMI 21.87 kg/m2  SpO2 96%  LMP 02/15/2015 Physical Exam  Constitutional: She is oriented to person, place, and time. No distress.  HENT:  Right Ear: External ear normal.  Left Ear: External ear normal.    Nose: Nose normal.  Mouth/Throat: Oropharynx is clear and moist. No oropharyngeal exudate.  Eyes: Conjunctivae and EOM are normal. Pupils are equal, round, and reactive to light.  Neck: Normal range of motion. Neck supple.  Cardiovascular: Normal rate, regular rhythm, normal heart sounds and intact distal pulses.   Pulmonary/Chest: Effort normal and breath sounds normal. No respiratory distress. She has no wheezes. She exhibits no tenderness.    Abdominal: Soft. Bowel sounds are normal. She exhibits no distension. There is no tenderness.  Musculoskeletal: She exhibits no edema.  Back is diffusely ttp  Neurological: She is alert and oriented to person, place, and time. No cranial nerve deficit.  Skin: Skin is warm and dry. She is not diaphoretic. There is pallor.  Psychiatric: She has a normal mood and affect.  Nursing note and vitals reviewed.   ED Course  Procedures (including critical care time) Labs Review Labs Reviewed  BASIC METABOLIC PANEL - Abnormal; Notable for the following:    CO2 21 (*)    Glucose, Bld 114 (*)    Creatinine, Ser 0.39 (*)    All other components within normal limits  CBC - Abnormal; Notable for the following:    WBC 11.1 (*)    RBC 2.44 (*)    Hemoglobin 8.7 (*)    HCT 25.4 (*)    MCV 104.1 (*)    MCH 35.7 (*)    RDW 18.5 (*)    All other components within normal limits  RETICULOCYTES - Abnormal; Notable for the following:    Retic Ct Pct 17.0 (*)    RBC. 2.59 (*)    Retic Count, Manual 440.3 (*)    All other components within normal limits  I-STAT TROPOININ, ED    Imaging Review Dg Chest 2 View  03/07/2015  CLINICAL DATA:  Chest pain with Short of breath.  Sickle cell. EXAM: CHEST  2 VIEW COMPARISON:  03/03/2015 FINDINGS: Port-A-Cath tip in the right atrium unchanged Heart size normal. No heart failure. Lungs are clear without infiltrate effusion or mass. No change from the prior study. IMPRESSION: No active cardiopulmonary disease.  Electronically Signed   By: Marlan Palau M.D.   On: 03/07/2015 13:22   I have personally reviewed and evaluated these images and lab results as part of my medical decision-making.   EKG Interpretation   Date/Time:  Thursday March 07 2015 12:36:26 EST Ventricular Rate:  100 PR Interval:  146 QRS Duration: 80 QT Interval:  364 QTC Calculation: 469 R Axis:   73 Text Interpretation:  Normal sinus rhythm Normal ECG When compared with  ECG of 04/29/2014, No significant change was found Confirmed by [facility] CenterGLICK  MD,  [name] ([id number]) on 03/07/2015 5:47:24 PM      MDM   Final diagnoses:  Sickle-cell disease with pain (HCC)  Intractable pain    Labs done in triage including cbc, i stat trop, bmp, EKG, and CXR unremarkable. Will check reticulocytes. Will give 1L NS bolus, 1mg  dilaudid, 30mg  toradol, and 25mg  phenergan.   Retics 17.0 today. Pain unrelieved with 4mg  dilaudid, 30mg  toradol, 25mg  benadryl, and 25mg  phenergan in the ED. Pt also given 1L NS bolus. Discussed with pt that if her pain is still not tolerable she will need to be admitted for intractable pain. She would like to be admitted. Spoke to Dr. Antionette Charpyd (hospitalist) for admission.   Brittany CoriaSerena Y Ericson Nafziger, PA-C 03/07/15 2229  Laurence Spatesachel Morgan Little, MD 03/08/15 [phone]

## 2015-03-07 NOTE — ED Notes (Addendum)
Patient called out twice, requesting more pain medication. PA made aware.

## 2015-03-07 NOTE — ED Notes (Signed)
Pt states acute onset chest pressure while laying in bed yesterday.  Pain is accompanied by nausea and sob.  She called her clinic and they told her to come to ED b/c there is not MD there until tomorrow.

## 2015-03-07 NOTE — ED Notes (Signed)
Dr. Clarene DukeLittle to see and assess patient before RN assessment. See MD assessment.

## 2015-03-07 NOTE — ED Notes (Signed)
Paged IV team for port-access kit.

## 2015-03-08 DIAGNOSIS — Z888 Allergy status to other drugs, medicaments and biological substances status: Secondary | ICD-10-CM | POA: Diagnosis not present

## 2015-03-08 DIAGNOSIS — Z79899 Other long term (current) drug therapy: Secondary | ICD-10-CM | POA: Diagnosis not present

## 2015-03-08 DIAGNOSIS — Z9049 Acquired absence of other specified parts of digestive tract: Secondary | ICD-10-CM | POA: Diagnosis not present

## 2015-03-08 DIAGNOSIS — R269 Unspecified abnormalities of gait and mobility: Secondary | ICD-10-CM | POA: Diagnosis not present

## 2015-03-08 DIAGNOSIS — R0789 Other chest pain: Secondary | ICD-10-CM | POA: Diagnosis not present

## 2015-03-08 DIAGNOSIS — Z885 Allergy status to narcotic agent status: Secondary | ICD-10-CM | POA: Diagnosis not present

## 2015-03-08 DIAGNOSIS — Z87891 Personal history of nicotine dependence: Secondary | ICD-10-CM | POA: Diagnosis not present

## 2015-03-08 DIAGNOSIS — G894 Chronic pain syndrome: Secondary | ICD-10-CM | POA: Diagnosis present

## 2015-03-08 DIAGNOSIS — Z9109 Other allergy status, other than to drugs and biological substances: Secondary | ICD-10-CM | POA: Diagnosis not present

## 2015-03-08 DIAGNOSIS — M25559 Pain in unspecified hip: Secondary | ICD-10-CM | POA: Diagnosis not present

## 2015-03-08 DIAGNOSIS — D5700 Hb-SS disease with crisis, unspecified: Secondary | ICD-10-CM | POA: Diagnosis present

## 2015-03-08 DIAGNOSIS — D72829 Elevated white blood cell count, unspecified: Secondary | ICD-10-CM | POA: Diagnosis present

## 2015-03-08 DIAGNOSIS — D638 Anemia in other chronic diseases classified elsewhere: Secondary | ICD-10-CM | POA: Diagnosis present

## 2015-03-08 DIAGNOSIS — R52 Pain, unspecified: Secondary | ICD-10-CM | POA: Insufficient documentation

## 2015-03-08 DIAGNOSIS — R079 Chest pain, unspecified: Secondary | ICD-10-CM | POA: Diagnosis present

## 2015-03-08 DIAGNOSIS — M25552 Pain in left hip: Secondary | ICD-10-CM | POA: Diagnosis present

## 2015-03-08 LAB — PHOSPHORUS: PHOSPHORUS: 3.7 mg/dL (ref 2.5–4.6)

## 2015-03-08 LAB — CBC
HEMATOCRIT: 22.2 % — AB (ref 36.0–46.0)
HEMOGLOBIN: 7.6 g/dL — AB (ref 12.0–15.0)
MCH: 36.2 pg — AB (ref 26.0–34.0)
MCHC: 34.2 g/dL (ref 30.0–36.0)
MCV: 105.7 fL — AB (ref 78.0–100.0)
Platelets: 328 10*3/uL (ref 150–400)
RBC: 2.1 MIL/uL — AB (ref 3.87–5.11)
RDW: 18.1 % — ABNORMAL HIGH (ref 11.5–15.5)
WBC: 11.1 10*3/uL — ABNORMAL HIGH (ref 4.0–10.5)

## 2015-03-08 LAB — DIFFERENTIAL
BASOS PCT: 0 %
Basophils Absolute: 0 10*3/uL (ref 0.0–0.1)
Eosinophils Absolute: 0.1 10*3/uL (ref 0.0–0.7)
Eosinophils Relative: 1 %
Lymphocytes Relative: 44 %
Lymphs Abs: 4.9 10*3/uL — ABNORMAL HIGH (ref 0.7–4.0)
MONO ABS: 0.9 10*3/uL (ref 0.1–1.0)
Monocytes Relative: 8 %
NEUTROS ABS: 5.3 10*3/uL (ref 1.7–7.7)
NEUTROS PCT: 47 %

## 2015-03-08 LAB — MAGNESIUM: Magnesium: 2 mg/dL (ref 1.7–2.4)

## 2015-03-08 LAB — TROPONIN I

## 2015-03-08 LAB — LACTATE DEHYDROGENASE: LDH: 252 U/L — ABNORMAL HIGH (ref 98–192)

## 2015-03-08 MED ORDER — SODIUM CHLORIDE 0.9 % IJ SOLN
10.0000 mL | INTRAMUSCULAR | Status: DC | PRN
  Administered 2015-03-08: 10 mL
  Filled 2015-03-08: qty 40

## 2015-03-08 MED ORDER — NALOXONE HCL 0.4 MG/ML IJ SOLN: 0.4000 mg | INTRAMUSCULAR | Status: DC | PRN

## 2015-03-08 MED ORDER — ONDANSETRON HCL 4 MG/2ML IJ SOLN: 4.0000 mg | Freq: Four times a day (QID) | INTRAMUSCULAR | Status: DC | PRN

## 2015-03-08 MED ORDER — HYDROMORPHONE 1 MG/ML IV SOLN
INTRAVENOUS | Status: DC
  Administered 2015-03-08: 11:00:00 via INTRAVENOUS
  Administered 2015-03-08: 2.7 mg via INTRAVENOUS
  Administered 2015-03-08: 4.4 mg via INTRAVENOUS
  Administered 2015-03-09: 3.3 mg via INTRAVENOUS
  Administered 2015-03-09: 4.2 mg via INTRAVENOUS
  Administered 2015-03-09: 1.5 mg via INTRAVENOUS
  Administered 2015-03-09: 2.7 mg via INTRAVENOUS
  Administered 2015-03-09: 15:00:00 via INTRAVENOUS
  Filled 2015-03-08 (×2): qty 25

## 2015-03-08 MED ORDER — DIPHENHYDRAMINE HCL 50 MG/ML IJ SOLN
12.5000 mg | Freq: Four times a day (QID) | INTRAMUSCULAR | Status: DC | PRN
Start: 2015-03-08 — End: 2015-03-09

## 2015-03-08 MED ORDER — DIPHENHYDRAMINE HCL 12.5 MG/5ML PO ELIX
12.5000 mg | ORAL_SOLUTION | Freq: Four times a day (QID) | ORAL | Status: DC | PRN
Start: 2015-03-08 — End: 2015-03-09

## 2015-03-08 MED ORDER — FENTANYL CITRATE (PF) 100 MCG/2ML IJ SOLN
50.0000 ug | Freq: Once | INTRAMUSCULAR | Status: AC
  Administered 2015-03-08: 50 ug via INTRAVENOUS
  Filled 2015-03-08: qty 2

## 2015-03-08 MED ORDER — SODIUM CHLORIDE 0.9 % IJ SOLN: 9.0000 mL | INTRAMUSCULAR | Status: DC | PRN

## 2015-03-08 NOTE — Care Management Note (Signed)
Case Management Note  Patient Details  Name: Richmond CampbellMiranda Kellett MRN: [id number] Date of Birth: 01/25/1986  Subjective/Objective:29 y/o f admitted w/SSC. From home.                    Action/Plan:d/c plan home.   Expected Discharge Date:                  Expected Discharge Plan:  Home/Self Care  In-House Referral:     Discharge planning Services  CM Consult  Post Acute Care Choice:    Choice offered to:     DME Arranged:    DME Agency:     HH Arranged:    HH Agency:     Status of Service:  In process, will continue to follow  Medicare Important Message Given:    Date Medicare IM Given:    Medicare IM give by:    Date Additional Medicare IM Given:    Additional Medicare Important Message give by:     If discussed at Long Length of Stay Meetings, dates discussed:    Additional Comments:  Lanier ClamMahabir, Ragan Duhon, RN 03/08/2015, 3:14 PM

## 2015-03-08 NOTE — ED Notes (Signed)
EDP at bedside to assure pt is safe for transport.

## 2015-03-08 NOTE — Progress Notes (Signed)
Patient ID: Brittany Foster, female   DOB: Aug 21, 1985, 30 y.o.   MRN: [id number] [facility] PROGRESS NOTE  Atia Haupt [id number] DOB: 1985/03/25 DOA: 03/07/2015 PCP: Jeanann Lewandowsky, MD  Brief narrative:    30 y.o. female with past medical history of sickle cell disease who presented with reports of chest pain, hip pain, leg pain, constant, sharp, 10 out of 10 in intensity not relieved with home analgesia. Patient was just recently discharged from hospital 01/03  In ED, patient was found to be afebrile and saturating well on room air with vital signs stable. Troponin level was WNL. The 12 lead EKG showed normal sinus rhythm without ischemic changes. Chest x-ray was negative for acute cardiopulmonary disease. Blood work demonstrated white blood cell count of 11.1, hemoglobin 8.7.  Assessment/Plan:    Principal Problem:   Sickle cell disease with crisis (HCC) - Pain was managed with Dilaudid IV when necessary in ED. Pain is still 10 out of 10 in intensity so we'll start Dilaudid PCA - Patient is also on Toradol 30 mg IV every 6 hours and Norco every 4 hours as needed for breakthrough pain - Adjuvant therapy is Cymbalta 60 mg daily - Continue folic acid - Continue IV fluids  Active Problems:   Chest pain - The troponin level was within normal limits and the 12-lead EKG showed sinus rhythm and no ischemic changes    Leukocytosis - Likely reactive - White blood cell count is 11.1.   DVT Prophylaxis  - Heparin subcutaneous   Code Status: Full.  Family Communication:  plan of care discussed with the patient Disposition Plan: Home once pt stable and pain controlled, likely by 03/10/2015.    Procedures and diagnostic studies:    Dg Chest 2 View 03/07/2015 No active cardiopulmonary disease. Electronically Signed   By: Marlan Palau M.D.   On: 03/07/2015 13:22     Manson Passey, MD  [facility] Pager [phone]  Time spent in minutes: 25 minutes  If 7PM-7AM, please  contact night-coverage [email or web address] Password [email or web address] 03/08/2015, 11:16 AM      HPI/Subjective: No acute overnight events. Patient reports pain is 10/10.   Objective: Filed Vitals:   03/08/15 0227 03/08/15 0259 03/08/15 0452 03/08/15 1002  BP: 99/62 100/61 91/50 91/55   Pulse: 88 77 75 72  Temp: 98.2 F (36.8 C) 98.6 F (37 C) 98.2 F (36.8 C) 98.5 F (36.9 C)  TempSrc:  Oral Oral Oral  Resp: 16 16 14 16   Height:  5' (1.524 m)    Weight:  123 lb 10.9 oz (56.1 kg)    SpO2: 100% 96% 95% 98%    Intake/Output Summary (Last 24 hours) at 03/08/15 1116 Last data filed at 03/08/15 0700  Gross per 24 hour  Intake   1290 ml  Output      0 ml  Net   1290 ml    Exam:   General:  Pt is alert, not in acute distress  Cardiovascular: Regular rate and rhythm, S1/S2 (+)  Respiratory: Clear to auscultation bilaterally, no wheezing, no crackles, no rhonchi  Abdomen: Soft, non tender, non distended, bowel sounds present  Extremities: No edema, pulses DP and PT palpable bilaterally  Neuro: Grossly nonfocal  Data Reviewed: Basic Metabolic Panel:  Recent Labs Lab 03/05/15 1000 03/07/15 1243 03/08/15 0022  NA 141 140  --   K 3.2* 3.5  --   CL 110 110  --   CO2 23 21*  --   GLUCOSE 111* 114*  --  BUN 7 6  --   CREATININE 0.41* 0.39*  --   CALCIUM 8.8* 9.4  --   MG  --   --  2.0  PHOS  --   --  3.7   Liver Function Tests:  Recent Labs Lab 03/05/15 1000  AST 53*  ALT 37  ALKPHOS 89  BILITOT 2.0*  PROT 7.1  ALBUMIN 4.0   No results for input(s): LIPASE, AMYLASE in the last 168 hours. No results for input(s): AMMONIA in the last 168 hours. CBC:  Recent Labs Lab 03/05/15 1000 03/07/15 1243 03/08/15 0605  WBC 9.8 11.1* 11.1*  NEUTROABS 5.6  --  5.3  HGB 8.4* 8.7* 7.6*  HCT 24.8* 25.4* 22.2*  MCV 106.0* 104.1* 105.7*  PLT 299 317 328   Cardiac Enzymes:  Recent Labs Lab 03/08/15 0022  TROPONINI <0.03   BNP: Invalid input(s): POCBNP CBG: No results  for input(s): GLUCAP in the last 168 hours.  No results found for this or any previous visit (from the past 240 hour(s)).   Scheduled Meds: . DULoxetine  60 mg Oral Daily  . famotidine  20 mg Oral Q12H   Or  . famotidine (PEPCID) IV  20 mg Intravenous Q12H  . folic acid  1 mg Oral Daily  . heparin  5,000 Units Subcutaneous 3 times per day  . HYDROmorphone   Intravenous 6 times per day  . hydroxyurea  1,000 mg Oral Daily  . ketorolac  30 mg Intravenous 4 times per day  . senna-docusate  1 tablet Oral BID  . topiramate  50 mg Oral BID   Continuous Infusions: . dextrose 5 % and 0.45% NaCl 75 mL/hr at 03/08/15 0356

## 2015-03-09 DIAGNOSIS — D5700 Hb-SS disease with crisis, unspecified: Principal | ICD-10-CM

## 2015-03-09 MED ORDER — HYDROMORPHONE HCL 2 MG/ML IJ SOLN
2.0000 mg | Freq: Once | INTRAMUSCULAR | Status: AC
  Administered 2015-03-09: 2 mg via INTRAVENOUS
  Filled 2015-03-09: qty 1

## 2015-03-09 MED ORDER — HYDROMORPHONE 1 MG/ML IV SOLN
INTRAVENOUS | Status: DC
  Administered 2015-03-09: 7 mg via INTRAVENOUS
  Administered 2015-03-10: 1.5 mg via INTRAVENOUS
  Administered 2015-03-10: 12.6 mg via INTRAVENOUS
  Administered 2015-03-10: 7 mg via INTRAVENOUS
  Administered 2015-03-10: 6.7 mg via INTRAVENOUS
  Administered 2015-03-10: 11:00:00 via INTRAVENOUS
  Administered 2015-03-10: 5.5 mg via INTRAVENOUS
  Administered 2015-03-11: 7 mg via INTRAVENOUS
  Administered 2015-03-11: 3.5 mg via INTRAVENOUS
  Administered 2015-03-11: 3 mg via INTRAVENOUS
  Administered 2015-03-11: 7 mg via INTRAVENOUS
  Administered 2015-03-11: 3 mg via INTRAVENOUS
  Administered 2015-03-11: 8 mg via INTRAVENOUS
  Administered 2015-03-12: 5 mg via INTRAVENOUS
  Administered 2015-03-12: 11.5 mg via INTRAVENOUS
  Administered 2015-03-12: 1.5 mg via INTRAVENOUS
  Administered 2015-03-12: 2.5 mg via INTRAVENOUS
  Administered 2015-03-12: 12 mg via INTRAVENOUS
  Administered 2015-03-12: 9.5 mg via INTRAVENOUS
  Administered 2015-03-12: 12:00:00 via INTRAVENOUS
  Administered 2015-03-13: 4.4 mg via INTRAVENOUS
  Administered 2015-03-13: 6.5 mg via INTRAVENOUS
  Administered 2015-03-13: 8 mg via INTRAVENOUS
  Administered 2015-03-13: 06:00:00 via INTRAVENOUS
  Filled 2015-03-09 (×5): qty 25

## 2015-03-09 NOTE — Progress Notes (Signed)
Subjective: A 30 year old female well-known to our service with sickle cell painful crisis on chronic pain syndrome presenting with acute sickle cell crisis. Patient has been placed on Dilaudid PCA with a bolus dose of 0.3 mg and a total of 1.25 mg maximum per hour. Patient isn't very tolerant to opiates. Pain has remained at 10 out of 10. She has used 18.8 mg with 79 demands and 61 deliveries on her PCA She is not on her long-acting or short-acting oral medications. She is on Toradol however. No fever, no nausea vomiting or diarrhea.  Objective: Vital signs in last 24 hours: Temp:  [98.1 F (36.7 C)-98.7 F (37.1 C)] 98.7 F (37.1 C) (01/07 1300) Pulse Rate:  [71-86] 75 (01/07 1300) Resp:  [13-16] 13 (01/07 2000) BP: (90-98)/(53-61) 98/56 mmHg (01/07 1300) SpO2:  [96 %-100 %] 99 % (01/07 2000) Weight change:  Last BM Date: 03/07/15  Intake/Output from previous day: 01/06 0701 - 01/07 0700 In: 498.8 [I.V.:348.8; IV Piggyback:150] Out: -  Intake/Output this shift:    General appearance: alert, cooperative and mild distress Head: Normocephalic, without obvious abnormality, atraumatic Neck: no adenopathy, no carotid bruit, no JVD, supple, symmetrical, trachea midline and thyroid not enlarged, symmetric, no tenderness/mass/nodules Back: symmetric, no curvature. ROM normal. No CVA tenderness. Resp: clear to auscultation bilaterally Chest wall: no tenderness Cardio: regular rate and rhythm, S1, S2 normal, no murmur, click, rub or gallop GI: soft, non-tender; bowel sounds normal; no masses,  no organomegaly Extremities: extremities normal, atraumatic, no cyanosis or edema Pulses: 2+ and symmetric Skin: Skin color, texture, turgor normal. No rashes or lesions Neurologic: Grossly normal  Lab Results:  Recent Labs  03/07/15 1243 03/08/15 0605  WBC 11.1* 11.1*  HGB 8.7* 7.6*  HCT 25.4* 22.2*  PLT 317 328   BMET  Recent Labs  03/07/15 1243  NA 140  K 3.5  CL 110  CO2 21*   GLUCOSE 114*  BUN 6  CREATININE 0.39*  CALCIUM 9.4    Studies/Results: No results found.  Medications: I have reviewed the patient's current medications.  Assessment/Plan: A 30 year old female well-known to our service here with sickle cell painful crisis.  #1 sickle cell painful crisis: Patient is always having pain due to her chronic pain issues. She is however on very low dose of her PCA basal her opiate tolerance. I will change had PCA to high-dose PCA. Start oral medications and continue IV fluids. I will reassess her pain tomorrow #2 chronic pain syndrome: Patient is on Belbuca oral film. Has not been getting it here. Also on Neurontin. We will monitor her pain and consider re-ordering.  #3 sickle cell anemia: Hemoglobin is stable at her baseline. We will continue close monitoring.  #4 leukocytosis: We will monitor white count.   LOS: 1 day   GARBA,LAWAL 03/09/2015, 9:06 PM

## 2015-03-10 MED ORDER — OXYCODONE HCL 5 MG PO TABS
5.0000 mg | ORAL_TABLET | Freq: Four times a day (QID) | ORAL | Status: DC | PRN
  Administered 2015-03-10 (×2): 5 mg via ORAL
  Filled 2015-03-10 (×2): qty 1

## 2015-03-10 MED ORDER — OXYCODONE-ACETAMINOPHEN 5-325 MG PO TABS
1.0000 | ORAL_TABLET | Freq: Four times a day (QID) | ORAL | Status: DC | PRN
  Administered 2015-03-10 (×2): 1 via ORAL
  Filled 2015-03-10 (×2): qty 1

## 2015-03-10 MED ORDER — OXYCODONE-ACETAMINOPHEN 10-325 MG PO TABS: 1.0000 | ORAL_TABLET | Freq: Four times a day (QID) | ORAL | Status: DC | PRN

## 2015-03-10 MED ORDER — BUPRENORPHINE HCL 75 MCG BU FILM: 75.0000 ug | ORAL_FILM | Freq: Two times a day (BID) | BUCCAL | Status: DC

## 2015-03-10 MED ORDER — GABAPENTIN 300 MG PO CAPS
300.0000 mg | ORAL_CAPSULE | Freq: Three times a day (TID) | ORAL | Status: DC
  Administered 2015-03-10 (×2): 300 mg via ORAL
  Filled 2015-03-10 (×9): qty 1

## 2015-03-10 NOTE — Progress Notes (Signed)
Subjective: Patient is still complaining of 10 out of 10 pain. She is now on high dose Dilaudid PCA where she used 36 mg with 98 demands and 72 deliveries. She is also on oral medications. No fever, no nausea vomiting or diarrhea. Patient is lying down in bed without any evidence of distress despite complaining of 10 out of 10 pain.  Objective: Vital signs in last 24 hours: Temp:  [98 F (36.7 C)-98.7 F (37.1 C)] 98.7 F (37.1 C) (01/08 1338) Pulse Rate:  [76-88] 79 (01/08 1338) Resp:  [13-19] 16 (01/08 1338) BP: (90-99)/(53-56) 90/56 mmHg (01/08 1338) SpO2:  [95 %-99 %] 97 % (01/08 1338) Weight change:  Last BM Date: 03/07/15  Intake/Output from previous day: 01/07 0701 - 01/08 0700 In: 835.4 [I.V.:785.4; IV Piggyback:50] Out: -  Intake/Output this shift:    General appearance: alert, cooperative and mild distress Head: Normocephalic, without obvious abnormality, atraumatic Neck: no adenopathy, no carotid bruit, no JVD, supple, symmetrical, trachea midline and thyroid not enlarged, symmetric, no tenderness/mass/nodules Back: symmetric, no curvature. ROM normal. No CVA tenderness. Resp: clear to auscultation bilaterally Chest wall: no tenderness Cardio: regular rate and rhythm, S1, S2 normal, no murmur, click, rub or gallop GI: soft, non-tender; bowel sounds normal; no masses,  no organomegaly Extremities: extremities normal, atraumatic, no cyanosis or edema Pulses: 2+ and symmetric Skin: Skin color, texture, turgor normal. No rashes or lesions Neurologic: Grossly normal  Lab Results:  Recent Labs  03/08/15 0605  WBC 11.1*  HGB 7.6*  HCT 22.2*  PLT 328   BMET No results for input(s): NA, K, CL, CO2, GLUCOSE, BUN, CREATININE, CALCIUM in the last 72 hours.  Studies/Results: No results found.  Medications: I have reviewed the patient's current medications.  Assessment/Plan: A 30 year old female well-known to our service here with sickle cell painful crisis.  #1  sickle cell painful crisis: Patient is always having pain due to her chronic pain issues. I will start her long-acting medication with plan to discharge her tomorrow if she is doing better. Continue PCA and Toradol #2 chronic pain syndrome: Patient is on Belbuca oral film. Has not been getting it here. Also on Neurontin. We will restart both of them and monitor her pain  #3 sickle cell anemia: Hemoglobin is stable at her baseline. We will continue close monitoring.  #4 leukocytosis: We will monitor white count.   LOS: 2 days   Jariah Tarkowski,LAWAL 03/10/2015, 7:13 PM

## 2015-03-11 ENCOUNTER — Inpatient Hospital Stay (HOSPITAL_COMMUNITY): Payer: Medicaid Other

## 2015-03-11 DIAGNOSIS — D638 Anemia in other chronic diseases classified elsewhere: Secondary | ICD-10-CM

## 2015-03-11 DIAGNOSIS — M25559 Pain in unspecified hip: Secondary | ICD-10-CM

## 2015-03-11 LAB — CBC WITH DIFFERENTIAL/PLATELET
BASOS PCT: 0 %
Basophils Absolute: 0 10*3/uL (ref 0.0–0.1)
Eosinophils Absolute: 0.1 10*3/uL (ref 0.0–0.7)
Eosinophils Relative: 1 %
HEMATOCRIT: 21.9 % — AB (ref 36.0–46.0)
HEMOGLOBIN: 7.4 g/dL — AB (ref 12.0–15.0)
LYMPHS ABS: 3.5 10*3/uL (ref 0.7–4.0)
Lymphocytes Relative: 37 %
MCH: 35.9 pg — AB (ref 26.0–34.0)
MCHC: 33.8 g/dL (ref 30.0–36.0)
MCV: 106.3 fL — AB (ref 78.0–100.0)
MONO ABS: 0.7 10*3/uL (ref 0.1–1.0)
MONOS PCT: 7 %
NEUTROS ABS: 5.2 10*3/uL (ref 1.7–7.7)
NEUTROS PCT: 55 %
Platelets: 350 10*3/uL (ref 150–400)
RBC: 2.06 MIL/uL — ABNORMAL LOW (ref 3.87–5.11)
RDW: 16.9 % — AB (ref 11.5–15.5)
WBC: 9.5 10*3/uL (ref 4.0–10.5)

## 2015-03-11 LAB — COMPREHENSIVE METABOLIC PANEL
ALK PHOS: 83 U/L (ref 38–126)
ALT: 27 U/L (ref 14–54)
ANION GAP: 4 — AB (ref 5–15)
AST: 37 U/L (ref 15–41)
Albumin: 3.5 g/dL (ref 3.5–5.0)
BILIRUBIN TOTAL: 1.4 mg/dL — AB (ref 0.3–1.2)
BUN: <5 mg/dL — ABNORMAL LOW (ref 6–20)
CALCIUM: 8.4 mg/dL — AB (ref 8.9–10.3)
CO2: 23 mmol/L (ref 22–32)
Chloride: 115 mmol/L — ABNORMAL HIGH (ref 101–111)
Creatinine, Ser: 0.54 mg/dL (ref 0.44–1.00)
GLUCOSE: 126 mg/dL — AB (ref 65–99)
POTASSIUM: 3.5 mmol/L (ref 3.5–5.1)
Sodium: 142 mmol/L (ref 135–145)
TOTAL PROTEIN: 6.5 g/dL (ref 6.5–8.1)

## 2015-03-11 MED ORDER — HYDROMORPHONE HCL 2 MG/ML IJ SOLN
2.0000 mg | INTRAMUSCULAR | Status: DC | PRN
  Administered 2015-03-11 – 2015-03-13 (×12): 2 mg via INTRAVENOUS
  Filled 2015-03-11 (×8): qty 1

## 2015-03-11 MED ORDER — SODIUM CHLORIDE 0.9 % IV SOLN
25.0000 mg | INTRAVENOUS | Status: DC | PRN
  Administered 2015-03-11 – 2015-03-12 (×4): 25 mg via INTRAVENOUS
  Filled 2015-03-11 (×9): qty 0.5

## 2015-03-11 MED ORDER — DIPHENHYDRAMINE HCL 25 MG PO CAPS
25.0000 mg | ORAL_CAPSULE | ORAL | Status: DC | PRN
  Administered 2015-03-12: 25 mg via ORAL
  Filled 2015-03-11 (×2): qty 1

## 2015-03-11 NOTE — Progress Notes (Signed)
SICKLE CELL SERVICE PROGRESS NOTE  Brittany Foster ZOX:[id number] DOB: Feb 08, 1986 DOA: 03/07/2015 PCP: Jeanann Lewandowsky, MD  Assessment/Plan: Principal Problem:   Sickle cell disease with crisis (HCC) Active Problems:   Sickle cell crisis (HCC)   Chest pain   Leukocytosis   Intractable pain  1. Hb SS with crisis: Pt has used 33 mg with 66/60:demands/deliveries. She still c/o pain in her hips L>R impacting her ambulation. I will continue PCA  However without the Belbucca which she is chronically on I expect a possible need for increased opiates. Continue Dilaudid and Toradol and IVF.  2. B/L Hip pain: Pt reports pain is impacting ability to ambulate. She reports that her crises have been most prominent in her hips lately. Will obtain x-ray of hips to evaluate for acute process vs. AVN. 3. Anemia of chronic disease: Hb currently at baseline. 4. Chronic pain: Pt chronically on Belbucca (Buprenorphine). However it we do not have it on formulary and patient has not been able to have her home medications brought in for use. I will consult with Pharmacy to consider substitutions. Also continue Neurontin 5. Sickle Cell Disease: Continue Hydrea 1000 mg Daily.    Code Status: Full Code Family Communication: N/A Disposition Plan: Not yet ready for discharge  Josaiah Muhammed A.  Pager [phone]. If 7PM-7AM, please contact night-coverage.  03/11/2015, 11:30 AM  LOS: 3 days   Interim History: Pt has had very modest use of the PCA. She is requesting clinician assisted Dilaudid in addition to the PCA. She has been advised to use the PCA more aggressively and will re-assess need in the next 4 hours.  She reports pain in the B/L hips and in the chest wall.    Consultants:  None  Procedures:  None  Antibiotics:  None   Objective: Filed Vitals:   03/11/15 0422 03/11/15 0517 03/11/15 0718 03/11/15 0957  BP:  93/56  94/50  Pulse:  83  99  Temp:  98.8 F (37.1 C)  99 F (37.2 C)  TempSrc:   Oral  Oral  Resp: 17 15 15 16   Height:      Weight:      SpO2: 98% 100% 95% 95%   Weight change:   Intake/Output Summary (Last 24 hours) at 03/11/15 1130 Last data filed at 03/11/15 0900  Gross per 24 hour  Intake 4201.67 ml  Output      0 ml  Net 4201.67 ml    General: Alert, awake, oriented x3, in no apparent distress.  HEENT: [place]/AT PEERL, EOMI, anicteric Neck: Trachea midline,  no masses, no thyromegal,y no JVD, no carotid bruit OROPHARYNX:  Moist, No exudate/ erythema/lesions.  Heart: Regular rate and rhythm, without murmurs, rubs, gallops, PMI non-displaced, no heaves or thrills on palpation.  Lungs: Clear to auscultation, no wheezing or rhonchi noted. No increased vocal fremitus resonant to percussion  Abdomen: Soft, nontender, nondistended, positive bowel sounds, no masses no hepatosplenomegaly noted.  Neuro: No focal neurological deficits noted cranial nerves II through XII grossly intact.  Strength at functional baseline in bilateral upper and lower extremities. Musculoskeletal: No warmth swelling or erythema around joints, no spinal tenderness noted. Psychiatric: Patient alert and oriented x3, good insight and cognition, good recent to remote recall.    Data Reviewed: Basic Metabolic Panel:  Recent Labs Lab 03/05/15 1000 03/07/15 1243 03/08/15 0022 03/11/15 0544  NA 141 140  --  142  K 3.2* 3.5  --  3.5  CL 110 110  --  115*  CO2  23 21*  --  23  GLUCOSE 111* 114*  --  126*  BUN 7 6  --  <5*  CREATININE 0.41* 0.39*  --  0.54  CALCIUM 8.8* 9.4  --  8.4*  MG  --   --  2.0  --   PHOS  --   --  3.7  --    Liver Function Tests:  Recent Labs Lab 03/05/15 1000 03/11/15 0544  AST 53* 37  ALT 37 27  ALKPHOS 89 83  BILITOT 2.0* 1.4*  PROT 7.1 6.5  ALBUMIN 4.0 3.5   No results for input(s): LIPASE, AMYLASE in the last 168 hours. No results for input(s): AMMONIA in the last 168 hours. CBC:  Recent Labs Lab 03/05/15 1000 03/07/15 1243 03/08/15 0605  03/11/15 0544  WBC 9.8 11.1* 11.1* 9.5  NEUTROABS 5.6  --  5.3 5.2  HGB 8.4* 8.7* 7.6* 7.4*  HCT 24.8* 25.4* 22.2* 21.9*  MCV 106.0* 104.1* 105.7* 106.3*  PLT 299 317 328 350   Cardiac Enzymes:  Recent Labs Lab 03/08/15 0022  TROPONINI <0.03   BNP (last 3 results)  Recent Labs  03/18/14 1811 04/29/14 1459  BNP 78.1 114.5*    ProBNP (last 3 results) No results for input(s): PROBNP in the last 8760 hours.  CBG: No results for input(s): GLUCAP in the last 168 hours.  No results found for this or any previous visit (from the past 240 hour(s)).   Studies: Dg Chest 2 View  03/07/2015  CLINICAL DATA:  Chest pain with Short of breath.  Sickle cell. EXAM: CHEST  2 VIEW COMPARISON:  03/03/2015 FINDINGS: Port-A-Cath tip in the right atrium unchanged Heart size normal. No heart failure. Lungs are clear without infiltrate effusion or mass. No change from the prior study. IMPRESSION: No active cardiopulmonary disease. Electronically Signed   By: Marlan Palauharles  Clark M.D.   On: 03/07/2015 13:22    Scheduled Meds: . Buprenorphine HCl  75 mcg Buccal BID  . DULoxetine  60 mg Oral Daily  . famotidine  20 mg Oral Q12H   Or  . famotidine (PEPCID) IV  20 mg Intravenous Q12H  . folic acid  1 mg Oral Daily  . gabapentin  300 mg Oral TID  . heparin  5,000 Units Subcutaneous 3 times per day  . HYDROmorphone   Intravenous 6 times per day  . hydroxyurea  1,000 mg Oral Daily  . ketorolac  30 mg Intravenous 4 times per day  . senna-docusate  1 tablet Oral BID  . topiramate  50 mg Oral BID   Continuous Infusions: . dextrose 5 % and 0.45% NaCl 75 mL/hr at 03/10/15 1635    Time spent 35 minutes

## 2015-03-11 NOTE — Progress Notes (Signed)
Called report to RN for 1332. Answered all questions. Patient will be transferred shortly.

## 2015-03-11 NOTE — Progress Notes (Signed)
Pharmacy unable to obtain Belbucca today. Will substitute dilaudid and add clinician assisted doses for breakthrough pain.

## 2015-03-11 NOTE — Care Management Note (Signed)
Case Management Note  Patient Details  Name: Richmond CampbellMiranda Jago MRN: [id number] Date of Birth: 05/14/1985  Subjective/Objective: 30 y/o f admitted w/SSC. From home.                   Action/Plan:d/c plan home.   Expected Discharge Date:                  Expected Discharge Plan:  Home/Self Care  In-House Referral:     Discharge planning Services  CM Consult  Post Acute Care Choice:    Choice offered to:     DME Arranged:    DME Agency:     HH Arranged:    HH Agency:     Status of Service:  In process, will continue to follow  Medicare Important Message Given:    Date Medicare IM Given:    Medicare IM give by:    Date Additional Medicare IM Given:    Additional Medicare Important Message give by:     If discussed at Long Length of Stay Meetings, dates discussed:    Additional Comments:  Lanier ClamMahabir, Alencia Gordon, RN 03/11/2015, 4:54 PM

## 2015-03-12 DIAGNOSIS — R0789 Other chest pain: Secondary | ICD-10-CM

## 2015-03-12 DIAGNOSIS — R269 Unspecified abnormalities of gait and mobility: Secondary | ICD-10-CM

## 2015-03-12 LAB — RETICULOCYTES
RBC.: 1.97 MIL/uL — AB (ref 3.87–5.11)
RETIC CT PCT: 12.3 % — AB (ref 0.4–3.1)
Retic Count, Absolute: 242.3 10*3/uL — ABNORMAL HIGH (ref 19.0–186.0)

## 2015-03-12 NOTE — Progress Notes (Signed)
SICKLE CELL SERVICE PROGRESS NOTE  Brittany Foster ZOX:[id number] DOB: 01/01/1986 DOA: 03/07/2015 PCP: Jeanann Lewandowsky, MD  Assessment/Plan: Principal Problem:   Sickle cell disease with crisis (HCC) Active Problems:   Sickle cell crisis (HCC)   Chest pain   Leukocytosis   Intractable pain  1. Hb SS with crisis: Pt  still c/o pain in her hips L>R however she was able to ambulate with an antalgic gait. She still has not obtained Belbucca from home. I will continue PCA  However without the Belbucca which she is chronically it is impossible to control her pain acutely as her receptors are now up regulated.  I will continue Dilaudid and Toradol and IVF. Goal of discharge is improved function.  2. B/L Hip pain: Pt reports pain is impacting ability to ambulate. She reports that her crises have been most prominent in her hips lately. X-ray of hips negative for acute process or AVN. 3. Anemia of chronic disease: Hb currently at baseline. 4. Chronic pain: Pt chronically on Belbucca (Buprenorphine). However it we do not have it on formulary and patient has not been able to have her home medications brought in for use. I will consult with Pharmacy to consider substitutions. Also continue Neurontin 5. Sickle Cell Disease: Continue Hydrea 1000 mg Daily.    Code Status: Full Code Family Communication: N/A Disposition Plan: Not yet ready for discharge  Azreal Stthomas A.  Pager [phone]. If 7PM-7AM, please contact night-coverage.  03/12/2015, 2:43 PM  LOS: 4 days   Interim History: Pt has had increased use of the PCA in addition to 12 mg of Dilaudid in the last 24 hours. She reports pain in the B/L hips but decreased in  chest wall. She was able to ambulate approximately 1000 ft with SBA and using the IV pole as assistive device.   Consultants:  None  Procedures:  None  Antibiotics:  None   Objective: Filed Vitals:   03/12/15 0619 03/12/15 0958 03/12/15 1008 03/12/15 1212  BP: 98/59   88/54   Pulse: 88  87   Temp: 98.5 F (36.9 C)  98.5 F (36.9 C)   TempSrc: Oral  Oral   Resp: 17 15 15 15   Height:      Weight: 128 lb 12 oz (58.4 kg)     SpO2: 97% 96% 98% 98%   Weight change:   Intake/Output Summary (Last 24 hours) at 03/12/15 1443 Last data filed at 03/12/15 1008  Gross per 24 hour  Intake  817.5 ml  Output      0 ml  Net  817.5 ml    General: Alert, awake, oriented x3, in no apparent distress.  HEENT: [place]/AT PEERL, EOMI, anicteric Neck: Trachea midline,  no masses, no thyromegal,y no JVD, no carotid bruit OROPHARYNX:  Moist, No exudate/ erythema/lesions.  Heart: Regular rate and rhythm, without murmurs, rubs, gallops, PMI non-displaced, no heaves or thrills on palpation.  Lungs: Clear to auscultation, no wheezing or rhonchi noted. No increased vocal fremitus resonant to percussion  Abdomen: Soft, nontender, nondistended, positive bowel sounds, no masses no hepatosplenomegaly noted.  Neuro: No focal neurological deficits noted cranial nerves II through XII grossly intact.  Strength at functional baseline in bilateral upper and lower extremities. Musculoskeletal: No warmth swelling or erythema around joints, no spinal tenderness noted. Psychiatric: Patient alert and oriented x3, good insight and cognition, good recent to remote recall.    Data Reviewed: Basic Metabolic Panel:  Recent Labs Lab 03/07/15 1243 03/08/15 0022 03/11/15 0544  NA  140  --  142  K 3.5  --  3.5  CL 110  --  115*  CO2 21*  --  23  GLUCOSE 114*  --  126*  BUN 6  --  <5*  CREATININE 0.39*  --  0.54  CALCIUM 9.4  --  8.4*  MG  --  2.0  --   PHOS  --  3.7  --    Liver Function Tests:  Recent Labs Lab 03/11/15 0544  AST 37  ALT 27  ALKPHOS 83  BILITOT 1.4*  PROT 6.5  ALBUMIN 3.5   No results for input(s): LIPASE, AMYLASE in the last 168 hours. No results for input(s): AMMONIA in the last 168 hours. CBC:  Recent Labs Lab 03/07/15 1243 03/08/15 0605  03/11/15 0544  WBC 11.1* 11.1* 9.5  NEUTROABS  --  5.3 5.2  HGB 8.7* 7.6* 7.4*  HCT 25.4* 22.2* 21.9*  MCV 104.1* 105.7* 106.3*  PLT 317 328 350   Cardiac Enzymes:  Recent Labs Lab 03/08/15 0022  TROPONINI <0.03   BNP (last 3 results)  Recent Labs  03/18/14 1811 04/29/14 1459  BNP 78.1 114.5*    ProBNP (last 3 results) No results for input(s): PROBNP in the last 8760 hours.  CBG: No results for input(s): GLUCAP in the last 168 hours.  No results found for this or any previous visit (from the past 240 hour(s)).   Studies: Dg Chest 2 View  03/07/2015  CLINICAL DATA:  Chest pain with Short of breath.  Sickle cell. EXAM: CHEST  2 VIEW COMPARISON:  03/03/2015 FINDINGS: Port-A-Cath tip in the right atrium unchanged Heart size normal. No heart failure. Lungs are clear without infiltrate effusion or mass. No change from the prior study. IMPRESSION: No active cardiopulmonary disease. Electronically Signed   By: Marlan Palauharles  Clark M.D.   On: 03/07/2015 13:22   Dg Hips Bilat With Pelvis Min 5 Views  03/11/2015  CLINICAL DATA:  Hip pain. EXAM: DG HIP (WITH OR WITHOUT PELVIS) 5+V BILAT COMPARISON:  None. FINDINGS: No acute bony or joint abnormality identified. No evidence of fracture or dislocation. Pelvic calcifications consistent with phleboliths. IMPRESSION: No acute abnormality. Electronically Signed   By: Maisie Fushomas  Register   On: 03/11/2015 12:26    Scheduled Meds: . DULoxetine  60 mg Oral Daily  . famotidine  20 mg Oral Q12H  . folic acid  1 mg Oral Daily  . gabapentin  300 mg Oral TID  . heparin  5,000 Units Subcutaneous 3 times per day  . HYDROmorphone   Intravenous 6 times per day  . hydroxyurea  1,000 mg Oral Daily  . ketorolac  30 mg Intravenous 4 times per day  . senna-docusate  1 tablet Oral BID  . topiramate  50 mg Oral BID   Continuous Infusions: . dextrose 5 % and 0.45% NaCl 75 mL/hr at 03/10/15 1635    Time spent 40 minutes

## 2015-03-13 DIAGNOSIS — G894 Chronic pain syndrome: Secondary | ICD-10-CM

## 2015-03-13 MED ORDER — HEPARIN SOD (PORK) LOCK FLUSH 100 UNIT/ML IV SOLN
500.0000 [IU] | INTRAVENOUS | Status: AC | PRN
  Administered 2015-03-13: 500 [IU]
  Filled 2015-03-13: qty 5

## 2015-03-13 MED ORDER — OXYCODONE-ACETAMINOPHEN 10-325 MG PO TABS: 1.0000 | ORAL_TABLET | Freq: Four times a day (QID) | ORAL | Status: DC | PRN

## 2015-03-13 NOTE — Progress Notes (Signed)
Patient d/c home,stable. 

## 2015-03-13 NOTE — Progress Notes (Signed)
Discharge instructions given, verbalized understanding. Patient reminded of Md's d/c order to have bed rest, she understood  And notified this wroter that her sister would be helping her out at home. Port deaccessed per order, site [employer]. Patient ambulated this afternoon with NT, oxygen sat was 100% as documented.

## 2015-03-13 NOTE — Telephone Encounter (Signed)
Reviewed [place] Substance Reporting system prior to prescribing opiate medications. Inconsistencies noted [place]. Will give Percocet 10-325 mg # 60. Her next appointment is on 03/25/2015.   Meds ordered this encounter  Medications  . oxyCODONE-acetaminophen (PERCOCET) 10-325 MG tablet    Sig: Take 1 tablet by mouth every 6 (six) hours as needed for pain. No Refill before 02/07/2015    Dispense:  60 tablet    Refill:  0    Order Specific Question:  Supervising Provider    Answer:  Quentin AngstJEGEDE, OLUGBEMIGA E [[id number]]     Massie MaroonHollis,Matei Magnone M, FNP

## 2015-03-13 NOTE — Progress Notes (Signed)
Encouraged patient to increase activity per MD order in the d/c instructions, verbalized understanding.

## 2015-03-13 NOTE — Discharge Summary (Signed)
Brittany Foster MRN: [id number] DOB/AGE: May 08, 1985 29 y.o.  Admit date: 03/07/2015 Discharge date: 03/13/2015  Primary Care Physician:  Jeanann Lewandowsky, MD   Discharge Diagnoses:   Patient Active Problem List   Diagnosis Date Noted  . Intractable pain   . Chest pain 03/07/2015  . Leukocytosis 03/07/2015  . Vitamin D deficiency 01/08/2015  . Chronic pain syndrome 01/08/2015  . Sickle cell crisis (HCC) 10/22/2014  . Sickle-cell disease with pain (HCC)   . Sickle cell anemia with pain (HCC)   . Hypokalemia 08/21/2014  . Depression 08/09/2014  . 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 Sickle cell disease with crisis (HCC) 07/12/2014  . Hb-SS disease with crisis (HCC) 06/11/2014  . Pericardial effusion   . Anemia 02/09/2014  . Neuropathy (HCC) 02/09/2014  . Chronic headache disorder 02/09/2014  . Abnormal CT scan, chest     DISCHARGE MEDICATION:   Medication List    TAKE these medications        acetaminophen 325 MG tablet  Commonly known as:  TYLENOL  Take 650 mg by mouth every 6 (six) hours as needed for mild pain.     Buprenorphine HCl 75 MCG Film  Commonly known as:  BELBUCA  Place 75 mcg inside cheek 2 (two) times daily.     DULoxetine 60 MG capsule  Commonly known as:  CYMBALTA  Take 1 capsule (60 mg total) by mouth daily.     folic acid 1 MG tablet  Commonly known as:  FOLVITE  Take 1 tablet (1 mg total) by mouth daily.     gabapentin 300 MG capsule  Commonly known as:  NEURONTIN  Take 1 capsule (300 mg total) by mouth 3 (three) times daily.     hydroxyurea 500 MG capsule  Commonly known as:  HYDREA  Take 2 capsules (1,000 mg total) by mouth daily. May take with food to minimize GI side effects.     ibuprofen 200 MG tablet  Commonly known as:  ADVIL,MOTRIN  Take 200 mg by mouth every 6 (six) hours as needed for moderate pain.     oxyCODONE-acetaminophen 10-325 MG tablet  Commonly known as:  PERCOCET  Take 1 tablet by mouth every 6 (six)  hours as needed for pain. No Refill before 02/07/2015     potassium chloride SA 20 MEQ tablet  Commonly known as:  K-DUR,KLOR-CON  Take 1 tablet (20 mEq total) by mouth daily.     Topiramate ER 100 MG Cp24  Take 100 mg by mouth at bedtime.     Vitamin D3 5000 units Caps  Take 1 capsule (5,000 Units total) by mouth daily.          Consults:     SIGNIFICANT DIAGNOSTIC STUDIES:  Dg Chest 2 View  03/07/2015  CLINICAL DATA:  Chest pain with Short of breath.  Sickle cell. EXAM: CHEST  2 VIEW COMPARISON:  03/03/2015 FINDINGS: Port-A-Cath tip in the right atrium unchanged Heart size normal. No heart failure. Lungs are clear without infiltrate effusion or mass. No change from the prior study. IMPRESSION: No active cardiopulmonary disease. Electronically Signed   By: Marlan Palau M.D.   On: 03/07/2015 13:22   Dg Hips Bilat With Pelvis Min 5 Views  03/11/2015  CLINICAL DATA:  Hip pain. EXAM: DG HIP (WITH OR WITHOUT PELVIS) 5+V BILAT COMPARISON:  None. FINDINGS: No acute bony or joint abnormality identified. No evidence of fracture or dislocation. Pelvic calcifications consistent with phleboliths. IMPRESSION: No acute abnormality. Electronically Signed  ByMaisie Fus  Register   On: 03/11/2015 12:26      No results found for this or any previous visit (from the past 240 hour(s)).  BRIEF ADMITTING H & P: Brittany Foster is a 30 y.o. female with PMH of sickle cell disease presenting with pain in the chest, bilateral hips, and bilateral legs, not adequately managed with her home regimen. The patient had just recovered from a recent pain crisis, being discharged from the hospital on 03/05/2015, when she developed acute chest pain at rest the following day. The pain was acute in onset, severe in intensity, sharp in character, constant, with no relieving or exacerbating factors identified by the patient, and eventually subsiding without any specific intervention. There was associated dyspnea, but no  nausea or diaphoresis. Symptoms have returned and are waxing and waning. Over the past day, the patient has developed bilateral hip and leg pain as well. She denies any recent fever, chills, cough, nausea, vomiting, or diarrhea.   In ED, patient was found to be afebrile and saturating well on room air with vital signs stable. Troponin was undetectable and EKG featured in normal sinus rhythm without any ischemic changes. Chest x-ray was negative for any acute cardiopulmonary disease. Labs revealed a stable anemia and a new, mild leukocytosis to 11,000. Patient was given IV pushes of Dilaudid in the emergency department with temporary relief. She remained hemodynamically stable and the hospitalists were asked to admit for ongoing evaluation and management.    Hospital Course:  Present on Admission:  . Sickle cell disease with crisis [facility]): Pt was admitted in crisis and treated with IV Hydromorphone via PCA , Toradol and IVF. Her pain escalated and clinician assisted doses were added. Initially her gait was impaired due to pain however at the time of discharge her agit was normal. Pt had significant use of Dilaudid on the PCA in the last 24 hours. She was counseled to remain at least another day so that she could progress mor efficiently to her oral analgesics. However patient is requesting discharge and has no acute medical risk to require further hospitalization. Of note patient reports that she found some Belbucca in her handbag and took a dose last night and this morning without notifying the nurse or Provider.  . Anemia of Chronic Disease: Hb at baseline.  . Chronic Pain Syndrome: Pt on Belbucca and is advised to continue. She was at a pain clinic but was dismissed and will have her pain management provided by Dr. Hyman Hopes.  Disposition and Follow-up:  Pt discharged in good condition. She is to follow up with Dr. Hyman Hopes at her appointment on 03/24/2014.     Discharge Instructions    Bed rest     Complete by:  As directed      Diet general    Complete by:  As directed            DISCHARGE EXAM:  General: Alert, awake, oriented x3, in mild distress.  Vital Signs: BP 103/54, HR 96, T 98.3 F (36.8 C), temperature source Oral, RR 15, height 5' (1.524 m), weight 129 lb 13.6 oz (58.9 kg), last menstrual period 02/15/2015, SpO2 100 % on RA. HEENT: [place]/AT PEERL, EOMI, anicteric Neck: Trachea midline, no masses, no thyromegal,y no JVD, no carotid bruit OROPHARYNX: Moist, No exudate/ erythema/lesions.  Heart: Regular rate and rhythm, without murmurs, rubs, gallops or S3. PMI non-displaced. Exam reveals no decreased pulses. Pulmonary/Chest: Normal effort. Breath sounds normal. No. Apnea. Clear to auscultation,no stridor,  no wheezing and no rhonchi noted. No respiratory distress and no tenderness noted. Abdomen: Soft, nontender, nondistended, normal bowel sounds, no masses no hepatosplenomegaly noted. No fluid wave and no ascites. There is no guarding or rebound. Neuro: Alert and oriented to person, place and time. Normal motor skills, Displays no atrophy or tremors and exhibits normal muscle tone.  No focal neurological deficits noted cranial nerves II through XII grossly intact. No sensory deficit noted. Strength at baseline in bilateral upper and lower extremities. Gait normal. Musculoskeletal: No warm swelling or erythema around joints, no spinal tenderness noted. Psychiatric: Patient alert and oriented x3, good insight and cognition, good recent to remote recall. Mood, memory, affect and judgement normal Lymph node survey: No cervical axillary or inguinal lymphadenopathy noted. Skin: Skin is warm and dry. No bruising, no ecchymosis and no rash noted. Pt is not diaphoretic. No erythema. No pallor      Recent Labs  03/11/15 0544  NA 142  K 3.5  CL 115*  CO2 23  GLUCOSE 126*  BUN <5*  CREATININE 0.54  CALCIUM 8.4*    Recent Labs  03/11/15 0544  AST 37  ALT 27  ALKPHOS  83  BILITOT 1.4*  PROT 6.5  ALBUMIN 3.5   No results for input(s): LIPASE, AMYLASE in the last 72 hours.  Recent Labs  03/11/15 0544  WBC 9.5  NEUTROABS 5.2  HGB 7.4*  HCT 21.9*  MCV 106.3*  PLT 350     Total time spent including face to face and decision making was greater than 30 minutes  Signed: Maxene Byington A. 03/13/2015, 2:44 PM

## 2015-03-15 ENCOUNTER — Telehealth (HOSPITAL_COMMUNITY): Payer: Self-pay | Admitting: *Deleted

## 2015-03-15 NOTE — Telephone Encounter (Signed)
After speaking with the provider,C.[name], NP. Pt was told that she needed to go to the ED first to rule out any problems with her "upper ab" pain. Pt stated that it was not her upper abd but in her rib cage. This RN had clarified with patient in the incoming call that it was in fact her upper abd going into her rib cage. Patient hung up without acknowledging the RN's clarification of the earlier conversation.

## 2015-03-15 NOTE — Telephone Encounter (Signed)
Patient called [facility]. Pt stated that her pain was 8/10 and that she had taken her last pain med at 0630 this morning. Stated that her pain was in her rib cage and "upper abd". Told patient that I will check with the provider C.Hart RochesterHollis, NP and give her a call back. Pt voiced understanding.

## 2015-03-18 ENCOUNTER — Non-Acute Institutional Stay (HOSPITAL_COMMUNITY)
Admission: AD | Admit: 2015-03-18 | Discharge: 2015-03-18 | Disposition: A | Discharge status: Home / Self Care | Payer: Medicaid Other | Attending: Internal Medicine | Admitting: Internal Medicine

## 2015-03-18 ENCOUNTER — Encounter (HOSPITAL_COMMUNITY): Payer: Self-pay

## 2015-03-18 ENCOUNTER — Telehealth (HOSPITAL_COMMUNITY): Payer: Self-pay | Admitting: Hematology

## 2015-03-18 ENCOUNTER — Telehealth (HOSPITAL_COMMUNITY): Payer: Self-pay | Admitting: Internal Medicine

## 2015-03-18 DIAGNOSIS — D5700 Hb-SS disease with crisis, unspecified: Secondary | ICD-10-CM | POA: Diagnosis not present

## 2015-03-18 DIAGNOSIS — R52 Pain, unspecified: Secondary | ICD-10-CM | POA: Diagnosis present

## 2015-03-18 DIAGNOSIS — Z79891 Long term (current) use of opiate analgesic: Secondary | ICD-10-CM | POA: Diagnosis not present

## 2015-03-18 DIAGNOSIS — Z87891 Personal history of nicotine dependence: Secondary | ICD-10-CM | POA: Insufficient documentation

## 2015-03-18 DIAGNOSIS — Z791 Long term (current) use of non-steroidal anti-inflammatories (NSAID): Secondary | ICD-10-CM | POA: Diagnosis not present

## 2015-03-18 DIAGNOSIS — Z79899 Other long term (current) drug therapy: Secondary | ICD-10-CM | POA: Diagnosis not present

## 2015-03-18 LAB — CBC WITH DIFFERENTIAL/PLATELET
Basophils Absolute: 0.1 10*3/uL (ref 0.0–0.1)
Basophils Relative: 1 %
EOS ABS: 0 10*3/uL (ref 0.0–0.7)
Eosinophils Relative: 0 %
HEMATOCRIT: 23.3 % — AB (ref 36.0–46.0)
Hemoglobin: 8.2 g/dL — ABNORMAL LOW (ref 12.0–15.0)
Lymphocytes Relative: 32 %
Lymphs Abs: 3.1 10*3/uL (ref 0.7–4.0)
MCH: 36.6 pg — AB (ref 26.0–34.0)
MCHC: 35.2 g/dL (ref 30.0–36.0)
MCV: 104 fL — AB (ref 78.0–100.0)
MONOS PCT: 11 %
Monocytes Absolute: 1 10*3/uL (ref 0.1–1.0)
NEUTROS PCT: 56 %
Neutro Abs: 5.4 10*3/uL (ref 1.7–7.7)
PLATELETS: 356 10*3/uL (ref 150–400)
RBC: 2.24 MIL/uL — ABNORMAL LOW (ref 3.87–5.11)
RDW: 18.6 % — AB (ref 11.5–15.5)
WBC: 9.6 10*3/uL (ref 4.0–10.5)

## 2015-03-18 LAB — COMPREHENSIVE METABOLIC PANEL
ALK PHOS: 88 U/L (ref 38–126)
ALT: 31 U/L (ref 14–54)
AST: 47 U/L — ABNORMAL HIGH (ref 15–41)
Albumin: 4.3 g/dL (ref 3.5–5.0)
Anion gap: 11 (ref 5–15)
BILIRUBIN TOTAL: 1.9 mg/dL — AB (ref 0.3–1.2)
BUN: 6 mg/dL (ref 6–20)
CALCIUM: 9.3 mg/dL (ref 8.9–10.3)
CO2: 21 mmol/L — ABNORMAL LOW (ref 22–32)
CREATININE: 0.42 mg/dL — AB (ref 0.44–1.00)
Chloride: 112 mmol/L — ABNORMAL HIGH (ref 101–111)
Glucose, Bld: 108 mg/dL — ABNORMAL HIGH (ref 65–99)
Potassium: 2.8 mmol/L — ABNORMAL LOW (ref 3.5–5.1)
SODIUM: 144 mmol/L (ref 135–145)
TOTAL PROTEIN: 7.8 g/dL (ref 6.5–8.1)

## 2015-03-18 LAB — PREGNANCY, URINE: PREG TEST UR: NEGATIVE

## 2015-03-18 LAB — RETICULOCYTES
RBC.: 2.24 MIL/uL — ABNORMAL LOW (ref 3.87–5.11)
RETIC COUNT ABSOLUTE: 385.3 10*3/uL — AB (ref 19.0–186.0)
Retic Ct Pct: 17.2 % — ABNORMAL HIGH (ref 0.4–3.1)

## 2015-03-18 MED ORDER — SODIUM CHLORIDE 0.9 % IV SOLN
12.5000 mg | Freq: Four times a day (QID) | INTRAVENOUS | Status: DC | PRN
  Administered 2015-03-18: 12.5 mg via INTRAVENOUS
  Filled 2015-03-18 (×2): qty 0.25

## 2015-03-18 MED ORDER — PROMETHAZINE HCL 25 MG PO TABS
12.5000 mg | ORAL_TABLET | Freq: Once | ORAL | Status: AC
  Administered 2015-03-18: 12.5 mg via ORAL

## 2015-03-18 MED ORDER — DIPHENHYDRAMINE HCL 12.5 MG/5ML PO ELIX: 12.5000 mg | ORAL_SOLUTION | Freq: Four times a day (QID) | ORAL | Status: DC | PRN

## 2015-03-18 MED ORDER — KETOROLAC TROMETHAMINE 30 MG/ML IJ SOLN
30.0000 mg | Freq: Once | INTRAMUSCULAR | Status: AC
  Administered 2015-03-18: 30 mg via INTRAVENOUS
  Filled 2015-03-18: qty 1

## 2015-03-18 MED ORDER — DEXTROSE-NACL 5-0.45 % IV SOLN
INTRAVENOUS | Status: DC
  Administered 2015-03-18: 11:00:00 via INTRAVENOUS

## 2015-03-18 MED ORDER — SODIUM CHLORIDE 0.9 % IJ SOLN: 9.0000 mL | INTRAMUSCULAR | Status: DC | PRN

## 2015-03-18 MED ORDER — SODIUM CHLORIDE 0.9 % IJ SOLN: 10.0000 mL | INTRAMUSCULAR | Status: DC | PRN

## 2015-03-18 MED ORDER — OXYCODONE-ACETAMINOPHEN 5-325 MG PO TABS
1.0000 | ORAL_TABLET | Freq: Once | ORAL | Status: AC
  Administered 2015-03-18: 1 via ORAL
  Filled 2015-03-18: qty 1

## 2015-03-18 MED ORDER — OXYCODONE HCL 5 MG PO TABS
5.0000 mg | ORAL_TABLET | Freq: Once | ORAL | Status: AC
  Administered 2015-03-18: 5 mg via ORAL
  Filled 2015-03-18: qty 1

## 2015-03-18 MED ORDER — HEPARIN SOD (PORK) LOCK FLUSH 100 UNIT/ML IV SOLN
500.0000 [IU] | INTRAVENOUS | Status: DC | PRN
  Administered 2015-03-18: 500 [IU]
  Filled 2015-03-18: qty 5

## 2015-03-18 MED ORDER — ONDANSETRON HCL 4 MG/2ML IJ SOLN: 4.0000 mg | Freq: Four times a day (QID) | INTRAMUSCULAR | Status: DC | PRN

## 2015-03-18 MED ORDER — HYDROMORPHONE 1 MG/ML IV SOLN
INTRAVENOUS | Status: DC
  Administered 2015-03-18: 10:00:00 via INTRAVENOUS
  Administered 2015-03-18: 12.5 mg via INTRAVENOUS
  Administered 2015-03-18: 3.5 mg via INTRAVENOUS
  Filled 2015-03-18: qty 25

## 2015-03-18 MED ORDER — NALOXONE HCL 0.4 MG/ML IJ SOLN: 0.4000 mg | INTRAMUSCULAR | Status: DC | PRN

## 2015-03-18 NOTE — Discharge Summary (Signed)
[facility]   Patient ID: Brittany Foster MRN: [id number] DOB/AGE: October 02, 1985 30 y.o.  Admit date: 03/18/2015 Discharge date: 03/18/2015  Primary Care Physician:  Jeanann Lewandowsky, MD  Admission Diagnoses:  Active Problems:   Sickle cell anemia with pain [facility])  Discharge Medications:    Medication List    ASK your doctor about these medications        acetaminophen 325 MG tablet  Commonly known as:  TYLENOL  Take 650 mg by mouth every 6 (six) hours as needed for mild pain.     Buprenorphine HCl 75 MCG Film  Commonly known as:  BELBUCA  Place 75 mcg inside cheek 2 (two) times daily.     DULoxetine 60 MG capsule  Commonly known as:  CYMBALTA  Take 1 capsule (60 mg total) by mouth daily.     folic acid 1 MG tablet  Commonly known as:  FOLVITE  Take 1 tablet (1 mg total) by mouth daily.     gabapentin 300 MG capsule  Commonly known as:  NEURONTIN  Take 1 capsule (300 mg total) by mouth 3 (three) times daily.     hydroxyurea 500 MG capsule  Commonly known as:  HYDREA  Take 2 capsules (1,000 mg total) by mouth daily. May take with food to minimize GI side effects.     ibuprofen 200 MG tablet  Commonly known as:  ADVIL,MOTRIN  Take 200 mg by mouth every 6 (six) hours as needed for moderate pain.     oxyCODONE-acetaminophen 10-325 MG tablet  Commonly known as:  PERCOCET  Take 1 tablet by mouth every 6 (six) hours as needed for pain. No Refill before 02/07/2015     potassium chloride SA 20 MEQ tablet  Commonly known as:  K-DUR,KLOR-CON  Take 1 tablet (20 mEq total) by mouth daily.     Topiramate ER 100 MG Cp24  Take 100 mg by mouth at bedtime.     Vitamin D3 5000 units Caps  Take 1 capsule (5,000 Units total) by mouth daily.         Consults:  None  Significant Diagnostic Studies:  Dg Chest 2 View  03/07/2015  CLINICAL DATA:  Chest pain with Short of breath.  Sickle cell. EXAM: CHEST  2 VIEW COMPARISON:   03/03/2015 FINDINGS: Port-A-Cath tip in the right atrium unchanged Heart size normal. No heart failure. Lungs are clear without infiltrate effusion or mass. No change from the prior study. IMPRESSION: No active cardiopulmonary disease. Electronically Signed   By: Marlan Palau M.D.   On: 03/07/2015 13:22   Dg Hips Bilat With Pelvis Min 5 Views  03/11/2015  CLINICAL DATA:  Hip pain. EXAM: DG HIP (WITH OR WITHOUT PELVIS) 5+V BILAT COMPARISON:  None. FINDINGS: No acute bony or joint abnormality identified. No evidence of fracture or dislocation. Pelvic calcifications consistent with phleboliths. IMPRESSION: No acute abnormality. Electronically Signed   By: Maisie Fus  Register   On: 03/11/2015 12:26     [facility] Course: -Brittany Foster was admitted to the day infusion center for extended observation.  -Started D5.45 at 125 ml per hour for cellular rehydration.  -Toradol 30 mg IV times one for inflammation -Patient was started on a high concentration dilaudid PCA. She used a total of 16 mg with 32 demands and 32 deliveries. Patient was given a clinician assisted dosage of 2 mg.  She reports that pain intensity  is  6/10. She received Percocet 10-325 mg 30 minutes prior to discharge. Patient can manage at home on current medication regimen.  -She is alert, oriented, and ambulatory. Using incentive spirometer on demand. Oxygen saturation, 98% on RA  -Patient is to follow up in office as scheduled with Dr. Hyman HopesJegede.  - Recommend that she continue rest and increase hydration to 64 ounce of water every other hour.    Physical Exam at Discharge: BP 102/62 mmHg  Pulse 79  Temp(Src) 98.3 F (36.8 C) (Oral)  Resp 11  SpO2 99%  General Appearance:    Alert, cooperative, no distress, appears stated age  Head:    Normocephalic, without obvious abnormality, atraumatic  Eyes:    PERRL, conjunctiva/corneas clear, EOM's intact, fundi    benign, both eyes  Back:     Symmetric, no curvature, ROM  normal, no CVA tenderness  Lungs:     Clear to auscultation bilaterally, respirations unlabored  Chest Wall:    No tenderness or deformity   Heart:    Regular rate and rhythm, S1 and S2 normal, no murmur, rub   or gallop  Extremities:   Extremities normal, atraumatic, no cyanosis or edema  Pulses:   2+ and symmetric all extremities  Skin:   Skin color, texture, turgor normal, no rashes or lesions  Lymph nodes:   Cervical, supraclavicular, and axillary nodes normal  Neurologic:   CNII-XII intact, normal strength, sensation and reflexes    throughout   Disposition at Discharge: 01-Home or Self Care  Discharge Orders:   Condition at Discharge:   Stable  Time spent on Discharge:  15 minutes  Signed: Talan Gildner M 03/18/2015, 4:20 PM

## 2015-03-18 NOTE — Telephone Encounter (Signed)
Patient C/O pain to rib cage and legs. Patient rates pain 8/10 on pain scale.  Denies N/V/D, chest pain or shortness of breath. Patient took oxycodone at 0700, and Belbuca around 0500 without any improvement.Adivsed I wold notify the physician and give her a call back.  Patient verbalizes understanding.

## 2015-03-18 NOTE — Telephone Encounter (Signed)
Notified patient that, per NP, she may come to the day hospital for evaluation; pt verbalizes understanding 

## 2015-03-18 NOTE — H&P (Signed)
[facility] History and Physical   Date: 03/18/2015  Patient name: Brittany Foster Medical record number: [id number] Date of birth: [birth year] Age: 30 y.o. Gender: female PCP: Jeanann Lewandowsky, MD  Attending physician: Quentin Angst, MD  Chief Complaint: Generalized pain  History of Present Illness:  Brittany Foster, a 30 year old female with a history of sickle cell anemia, HbSS presents with generalized pain. She describes current pain intensity as constant and sharp. She attributes current pain intensity to changes in weather and starting her menstrual cycle.  Patient was advised several weeks ago to make a follow up appointment with primary provider for chronic pain. [name] has an appointment to discuss medication regimen and chronic pain with Dr. Hyman Hopes. She states that Belbuca, long acting pain medication is no longer effective in controlling pain.  Current pain intensity is 8/10. She states that she had Percocet 10-325 mg and Aleeve around 6 am with minimal relief. She maintains that she is taking all other medications consistently. Patient denies fatigue, headache, shortness of breath, urinary problems, nausea, vomiting, or diarrhea.   Meds: Prescriptions prior to admission  Medication Sig Dispense Refill Last Dose  . acetaminophen (TYLENOL) 325 MG tablet Take 650 mg by mouth every 6 (six) hours as needed for mild pain.   Past Week at Unknown time  . Buprenorphine HCl (BELBUCA) 75 MCG FILM Place 75 mcg inside cheek 2 (two) times daily. 60 each 0 03/06/2015 at Unknown time  . Cholecalciferol (VITAMIN D3) 5000 UNITS CAPS Take 1 capsule (5,000 Units total) by mouth daily. (Patient not taking: Reported on 03/07/2015) 30 capsule 5 Not Taking at Unknown time  . DULoxetine (CYMBALTA) 60 MG capsule Take 1 capsule (60 mg total) by mouth daily. 30 capsule 3 03/06/2015 at Unknown time  . folic acid (FOLVITE) 1 MG tablet Take 1 tablet (1 mg total) by mouth daily. 90  tablet 11 03/06/2015 at Unknown time  . gabapentin (NEURONTIN) 300 MG capsule Take 1 capsule (300 mg total) by mouth 3 (three) times daily. (Patient not taking: Reported on 03/07/2015) 90 capsule 2 Not Taking at Unknown time  . hydroxyurea (HYDREA) 500 MG capsule Take 2 capsules (1,000 mg total) by mouth daily. May take with food to minimize GI side effects. 60 capsule 3 03/06/2015 at Unknown time  . ibuprofen (ADVIL,MOTRIN) 200 MG tablet Take 200 mg by mouth every 6 (six) hours as needed for moderate pain.   Past Week at Unknown time  . oxyCODONE-acetaminophen (PERCOCET) 10-325 MG tablet Take 1 tablet by mouth every 6 (six) hours as needed for pain. No Refill before 02/07/2015 60 tablet 0   . potassium chloride SA (K-DUR,KLOR-CON) 20 MEQ tablet Take 1 tablet (20 mEq total) by mouth daily. 30 tablet 2 03/06/2015 at Unknown time  . Topiramate ER 100 MG CP24 Take 100 mg by mouth at bedtime.   03/06/2015 at Unknown time    Allergies: Ultram; Zofran; Morphine and related; and Tape Past Medical History  Diagnosis Date  . Sickle cell anemia (HCC)   . Anemia    Past Surgical History  Procedure Laterality Date  . Tubal ligation    . Cholecystectomy    . Cesarean section    . Port a cath placement Right     about 6-7 years ago  . Cholecystectomy  2000   Family History  Problem Relation Age of Onset  . Sickle cell anemia Other   .  Sickle cell trait Father   . Sickle cell trait Mother    Social History   Social History  . Marital Status: Single    Spouse Name: N/A  . Number of Children: N/A  . Years of Education: N/A   Occupational History  . Not on file.   Social History Main Topics  . Smoking status: Former Games developermoker  . Smokeless tobacco: Not on file     Comment: smokes once every couple of weeks.   . Alcohol Use: No  . Drug Use: No  . Sexual Activity: Yes    Birth Control/ Protection: Surgical   Other Topics Concern  . Not on file   Social History Narrative    Review of  Systems: Constitutional: negative for fatigue and fevers Eyes: negative Ears, nose, mouth, throat, and face: negative Respiratory: negative for cough, dyspnea on exertion and wheezing Cardiovascular: negative for chest pain, fatigue, orthopnea and tachypnea Gastrointestinal: negative for constipation, diarrhea and nausea Genitourinary:negative Integument/breast: negative Hematologic/lymphatic: negative Musculoskeletal:negative for myalgias Neurological: negative Behavioral/Psych: negative Endocrine: negative Allergic/Immunologic: negative  Physical Exam: Blood pressure 116/70, pulse 75, temperature 98.3 F (36.8 C), temperature source Oral, resp. rate 16. BP 116/70 mmHg  Pulse 75  Temp(Src) 98.3 F (36.8 C) (Oral)  Resp 16  General Appearance:    Alert, cooperative, mild distress, appears stated age  Head:    Normocephalic, without obvious abnormality, atraumatic  Eyes:    PERRL, conjunctiva/corneas clear, EOM's intact, fundi    benign, both eyes  Ears:    Normal TM's and external ear canals, both ears  Nose:   Nares normal, septum midline, mucosa normal, no drainage    or sinus tenderness  Back:     Symmetric, no curvature, ROM normal, no CVA tenderness  Lungs:     Clear to auscultation bilaterally, respirations unlabored  Chest Wall:    No tenderness or deformity   Heart:    Regular rate and rhythm, S1 and S2 normal, no murmur, rub   or gallop  Abdomen:     Soft, non-tender, bowel sounds active all four quadrants,    no masses, no organomegaly  Extremities:   Extremities normal, atraumatic, no cyanosis or edema  Pulses:   2+ and symmetric all extremities  Skin:   Skin color, texture, turgor normal, no rashes or lesions  Lymph nodes:   Cervical, supraclavicular, and axillary nodes normal  Neurologic:   CNII-XII intact, normal strength, sensation and reflexes    throughout    Lab results: No results found for this or any previous visit (from the past 24  hour(s)).  Imaging results:  No results found.   Assessment & Plan:  Patient will be admitted to the day infusion center for extended observation  Start IV D5.45 for cellular rehydration at 125/hr  Start Toradol 30 mg IV every 6 hours for inflammation.  Start Dilaudid PCA High Concentration per weight based protocol.   Patient will be re-evaluated for pain intensity in the context of function and     relationship to  baseline pain intensity as care progresses.  If no significant pain relief, will transfer patient to inpatient services for a higher level of care.   Will review CBCw/d, reticulocytes, and CMP.    Kirtan Sada M 03/18/2015, 9:48 AM

## 2015-03-18 NOTE — Progress Notes (Signed)
Pt discharged to home; discharge instructions explained,given, and signed; all questions answered; pt alert, oriented, ambulatory; accompanied by family for transport home; no complications noted

## 2015-03-21 ENCOUNTER — Telehealth (HOSPITAL_COMMUNITY): Payer: Self-pay

## 2015-03-21 NOTE — Telephone Encounter (Signed)
Patient called reporting pain in her legs, hips, and back. Patient informed to go to ER for treatment since we have no provider today.

## 2015-03-21 NOTE — Telephone Encounter (Signed)
Spoke with patient and advised her the [place], NP is recommending that she continues to take her medication and increase her fluid intake. She was also reminded to attend her followup appointment on 03/25/15. Patient states understanding.

## 2015-03-22 ENCOUNTER — Telehealth (HOSPITAL_COMMUNITY): Payer: Self-pay | Admitting: *Deleted

## 2015-03-22 ENCOUNTER — Non-Acute Institutional Stay (HOSPITAL_COMMUNITY)
Admission: AD | Admit: 2015-03-22 | Discharge: 2015-03-22 | Disposition: A | Discharge status: Home / Self Care | Payer: Medicaid Other | Attending: Internal Medicine | Admitting: Internal Medicine

## 2015-03-22 ENCOUNTER — Encounter (HOSPITAL_COMMUNITY): Payer: Self-pay | Admitting: *Deleted

## 2015-03-22 DIAGNOSIS — D5700 Hb-SS disease with crisis, unspecified: Secondary | ICD-10-CM | POA: Diagnosis present

## 2015-03-22 DIAGNOSIS — Z87891 Personal history of nicotine dependence: Secondary | ICD-10-CM | POA: Insufficient documentation

## 2015-03-22 DIAGNOSIS — Z7902 Long term (current) use of antithrombotics/antiplatelets: Secondary | ICD-10-CM | POA: Insufficient documentation

## 2015-03-22 DIAGNOSIS — R52 Pain, unspecified: Secondary | ICD-10-CM | POA: Diagnosis present

## 2015-03-22 DIAGNOSIS — Z79891 Long term (current) use of opiate analgesic: Secondary | ICD-10-CM | POA: Diagnosis not present

## 2015-03-22 DIAGNOSIS — Z79899 Other long term (current) drug therapy: Secondary | ICD-10-CM | POA: Diagnosis not present

## 2015-03-22 MED ORDER — DEXTROSE-NACL 5-0.45 % IV SOLN
INTRAVENOUS | Status: DC
  Administered 2015-03-22: 11:00:00 via INTRAVENOUS

## 2015-03-22 MED ORDER — SODIUM CHLORIDE 0.9 % IV SOLN
12.5000 mg | Freq: Four times a day (QID) | INTRAVENOUS | Status: DC | PRN
  Administered 2015-03-22: 12.5 mg via INTRAVENOUS
  Filled 2015-03-22 (×3): qty 0.25

## 2015-03-22 MED ORDER — SODIUM CHLORIDE 0.9 % IJ SOLN
10.0000 mL | INTRAMUSCULAR | Status: AC | PRN
  Administered 2015-03-22: 10 mL

## 2015-03-22 MED ORDER — SODIUM CHLORIDE 0.9 % IJ SOLN: 9.0000 mL | INTRAMUSCULAR | Status: DC | PRN

## 2015-03-22 MED ORDER — PROMETHAZINE HCL 25 MG PO TABS: 12.5000 mg | ORAL_TABLET | Freq: Four times a day (QID) | ORAL | Status: DC | PRN

## 2015-03-22 MED ORDER — HYDROMORPHONE 1 MG/ML IV SOLN
INTRAVENOUS | Status: DC
  Administered 2015-03-22: 11:00:00 via INTRAVENOUS
  Administered 2015-03-22: 15 mg via INTRAVENOUS
  Filled 2015-03-22: qty 25

## 2015-03-22 MED ORDER — OXYCODONE-ACETAMINOPHEN 5-325 MG PO TABS
1.0000 | ORAL_TABLET | Freq: Once | ORAL | Status: AC
  Administered 2015-03-22: 1 via ORAL
  Filled 2015-03-22: qty 1

## 2015-03-22 MED ORDER — KETOROLAC TROMETHAMINE 30 MG/ML IJ SOLN
30.0000 mg | Freq: Once | INTRAMUSCULAR | Status: AC
  Administered 2015-03-22: 30 mg via INTRAVENOUS
  Filled 2015-03-22: qty 1

## 2015-03-22 MED ORDER — HEPARIN SOD (PORK) LOCK FLUSH 100 UNIT/ML IV SOLN
500.0000 [IU] | INTRAVENOUS | Status: AC | PRN
  Administered 2015-03-22: 500 [IU]
  Filled 2015-03-22: qty 5

## 2015-03-22 MED ORDER — HEPARIN SOD (PORK) LOCK FLUSH 100 UNIT/ML IV SOLN: 500.0000 [IU] | INTRAVENOUS | Status: DC | PRN

## 2015-03-22 MED ORDER — SODIUM CHLORIDE 0.9 % IJ SOLN: 10.0000 mL | INTRAMUSCULAR | Status: DC | PRN

## 2015-03-22 MED ORDER — DIPHENHYDRAMINE HCL 12.5 MG/5ML PO ELIX: 12.5000 mg | ORAL_SOLUTION | Freq: Four times a day (QID) | ORAL | Status: DC | PRN

## 2015-03-22 MED ORDER — OXYCODONE HCL 5 MG PO TABS
5.0000 mg | ORAL_TABLET | Freq: Once | ORAL | Status: AC
Start: 2015-03-22 — End: 2015-03-22
  Administered 2015-03-22: 5 mg via ORAL
  Filled 2015-03-22: qty 1

## 2015-03-22 MED ORDER — NALOXONE HCL 0.4 MG/ML IJ SOLN: 0.4000 mg | INTRAMUSCULAR | Status: DC | PRN

## 2015-03-22 NOTE — H&P (Signed)
[facility] History and Physical   Date: 03/22/2015  Patient name: Aleiyah Halpin Medical record number: [id number] Date of birth: 1985/12/22 Age: 30 y.o. Gender: female PCP: Jeanann Lewandowsky, MD  Attending physician: Quentin Angst, MD  Chief Complaint: Generalized pain  History of Present Illness:  Tonda Wiederhold, a 30 year old female with a history of sickle cell anemia, HbSS presents with generalized pain. She describes current pain intensity as constant and sharp. She states that pain is primarily to lower back and lower extremities.   Patient was advised several weeks ago to make a follow up appointment with primary provider for chronic pain. Ms. Drabik has an appointment to discuss medication regimen and chronic pain with Dr. Hyman Hopes on 03/26/2015. She states that Belbuca, long acting pain medication is no longer effective in controlling pain.  Current pain intensity is 9/10. She states that she had Percocet 10-325 mg and Aleeve around this am with minimal relief. She maintains that she is taking all other medications consistently. Patient denies fatigue, headache, shortness of breath, urinary problems, nausea, vomiting, or diarrhea.   Meds: Prescriptions prior to admission  Medication Sig Dispense Refill Last Dose  . acetaminophen (TYLENOL) 325 MG tablet Take 650 mg by mouth every 6 (six) hours as needed for mild pain.   03/21/2015 at Unknown time  . Buprenorphine HCl (BELBUCA) 75 MCG FILM Place 75 mcg inside cheek 2 (two) times daily. 60 each 0 03/22/2015 at Unknown time  . DULoxetine (CYMBALTA) 60 MG capsule Take 1 capsule (60 mg total) by mouth daily. 30 capsule 3 03/21/2015 at Unknown time  . folic acid (FOLVITE) 1 MG tablet Take 1 tablet (1 mg total) by mouth daily. 90 tablet 11 03/21/2015 at Unknown time  . gabapentin (NEURONTIN) 300 MG capsule Take 1 capsule (300 mg total) by mouth 3 (three) times daily. 90 capsule 2 03/21/2015 at Unknown time  .  hydroxyurea (HYDREA) 500 MG capsule Take 2 capsules (1,000 mg total) by mouth daily. May take with food to minimize GI side effects. 60 capsule 3 03/21/2015 at Unknown time  . oxyCODONE-acetaminophen (PERCOCET) 10-325 MG tablet Take 1 tablet by mouth every 6 (six) hours as needed for pain. No Refill before 02/07/2015 60 tablet 0 03/22/2015 at Unknown time  . potassium chloride SA (K-DUR,KLOR-CON) 20 MEQ tablet Take 1 tablet (20 mEq total) by mouth daily. 30 tablet 2 03/21/2015 at Unknown time  . Topiramate ER 100 MG CP24 Take 100 mg by mouth at bedtime.   Past Week at Unknown time  . Cholecalciferol (VITAMIN D3) 5000 UNITS CAPS Take 1 capsule (5,000 Units total) by mouth daily. (Patient not taking: Reported on 03/07/2015) 30 capsule 5 Unknown at Unknown time  . ibuprofen (ADVIL,MOTRIN) 200 MG tablet Take 200 mg by mouth every 6 (six) hours as needed for moderate pain.   Unknown at Unknown time    Allergies: Ultram; Zofran; Morphine and related; and Tape Past Medical History  Diagnosis Date  . Sickle cell anemia (HCC)   . Anemia    Past Surgical History  Procedure Laterality Date  . Tubal ligation    . Cholecystectomy    . Cesarean section    . Port a cath placement Right     about 6-7 years ago  . Cholecystectomy  2000   Family History  Problem Relation Age of Onset  . Sickle cell anemia Other   . Sickle cell trait Father   .  Sickle cell trait Mother    Social History   Social History  . Marital Status: Single    Spouse Name: N/A  . Number of Children: N/A  . Years of Education: N/A   Occupational History  . Not on file.   Social History Main Topics  . Smoking status: Former Games developer  . Smokeless tobacco: Not on file     Comment: smokes once every couple of weeks.   . Alcohol Use: No  . Drug Use: No  . Sexual Activity: Yes    Birth Control/ Protection: Surgical   Other Topics Concern  . Not on file   Social History Narrative    Review of Systems: Constitutional:  negative for fatigue and fevers Eyes: negative Ears, nose, mouth, throat, and face: negative Respiratory: negative for cough, dyspnea on exertion and wheezing Cardiovascular: negative for chest pain, fatigue, orthopnea and tachypnea Gastrointestinal: negative for constipation, diarrhea and nausea Genitourinary:negative Integument/breast: negative Hematologic/lymphatic: negative Musculoskeletal:negative for myalgias Neurological: negative Behavioral/Psych: negative Endocrine: negative Allergic/Immunologic: negative  Physical Exam: Blood pressure 108/64, pulse 80, temperature 98.4 F (36.9 C), temperature source Oral, resp. rate 20, height 5' (1.524 m), weight 122 lb (55.339 kg), SpO2 100 %.   General Appearance:    Alert, cooperative, mild distress, appears stated age  Head:    Normocephalic, without obvious abnormality, atraumatic  Eyes:    PERRL, conjunctiva/corneas clear, EOM's intact, fundi    benign, both eyes  Ears:    Normal TM's and external ear canals, both ears  Nose:   Nares normal, septum midline, mucosa normal, no drainage    or sinus tenderness  Back:     Symmetric, no curvature, ROM normal, no CVA tenderness  Lungs:     Clear to auscultation bilaterally, respirations unlabored  Chest Wall:    No tenderness or deformity   Heart:    Regular rate and rhythm, S1 and S2 normal, no murmur, rub   or gallop  Abdomen:     Soft, non-tender, bowel sounds active all four quadrants,    no masses, no organomegaly  Extremities:   Extremities normal, atraumatic, no cyanosis or edema  Pulses:   2+ and symmetric all extremities  Skin:   Skin color, texture, turgor normal, no rashes or lesions  Lymph nodes:   Cervical, supraclavicular, and axillary nodes normal  Neurologic:   CNII-XII intact, normal strength, sensation and reflexes    throughout    Lab results: No results found for this or any previous visit (from the past 24 hour(s)).  Imaging results:  No results  found.   Assessment & Plan:  Patient will be admitted to the day infusion center for extended observation  Start IV D5.45 for cellular rehydration at 125/hr  Start Toradol 30 mg IV every 6 hours for inflammation.  Start Dilaudid PCA High Concentration per weight based protocol.   Patient will be re-evaluated for pain intensity in the context of function and     relationship to  baseline pain intensity as care progresses.  If no significant pain relief, will transfer patient to inpatient services for a higher level of care.   Reviewed previous labs, consistent with baseline.    [name],Lachina M 03/22/2015, 10:31 AM

## 2015-03-22 NOTE — Progress Notes (Signed)
[facility]. Pt was treated with IV fluids, Dilaudid PCA and Toradol. Pt's pain # was 8/10 and down to 7 at discharge. Pt was reminded to hydrate and take meds as prescribe. Pt voiced understanding of discharge instructions. Pt was alert, oriented and ambulatory at discharge.

## 2015-03-22 NOTE — Discharge Summary (Signed)
[facility] Discharge Summary   Patient ID: Brittany Foster MRN: [id number] DOB/AGE: 10-24-1985 30 y.o.  Admit date: 03/22/2015 Discharge date: 03/22/2015  Primary Care Physician:  Brittany Lewandowsky, MD  Admission Diagnoses:  Active Problems:   Sickle cell anemia with pain (HCC)  Discharge Medications:    Medication List    TAKE these medications        acetaminophen 325 MG tablet  Commonly known as:  TYLENOL  Take 650 mg by mouth every 6 (six) hours as needed for mild pain.     Buprenorphine HCl 75 MCG Film  Commonly known as:  BELBUCA  Place 75 mcg inside cheek 2 (two) times daily.     DULoxetine 60 MG capsule  Commonly known as:  CYMBALTA  Take 1 capsule (60 mg total) by mouth daily.     folic acid 1 MG tablet  Commonly known as:  FOLVITE  Take 1 tablet (1 mg total) by mouth daily.     gabapentin 300 MG capsule  Commonly known as:  NEURONTIN  Take 1 capsule (300 mg total) by mouth 3 (three) times daily.     hydroxyurea 500 MG capsule  Commonly known as:  HYDREA  Take 2 capsules (1,000 mg total) by mouth daily. May take with food to minimize GI side effects.     ibuprofen 200 MG tablet  Commonly known as:  ADVIL,MOTRIN  Take 200 mg by mouth every 6 (six) hours as needed for moderate pain.     oxyCODONE-acetaminophen 10-325 MG tablet  Commonly known as:  PERCOCET  Take 1 tablet by mouth every 6 (six) hours as needed for pain. No Refill before 02/07/2015     potassium chloride SA 20 MEQ tablet  Commonly known as:  K-DUR,KLOR-CON  Take 1 tablet (20 mEq total) by mouth daily.     Topiramate ER 100 MG Cp24  Take 100 mg by mouth at bedtime.     Vitamin D3 5000 units Caps  Take 1 capsule (5,000 Units total) by mouth daily.         Consults:  None  Significant Diagnostic Studies:  Dg Chest 2 View  03/07/2015  CLINICAL DATA:  Chest pain with Short of breath.  Sickle cell. EXAM: CHEST  2 VIEW COMPARISON:  03/03/2015  FINDINGS: Port-A-Cath tip in the right atrium unchanged Heart size normal. No heart failure. Lungs are clear without infiltrate effusion or mass. No change from the prior study. IMPRESSION: No active cardiopulmonary disease. Electronically Signed   By: Marlan Palau M.D.   On: 03/07/2015 13:22   Dg Hips Bilat With Pelvis Min 5 Views  03/11/2015  CLINICAL DATA:  Hip pain. EXAM: DG HIP (WITH OR WITHOUT PELVIS) 5+V BILAT COMPARISON:  None. FINDINGS: No acute bony or joint abnormality identified. No evidence of fracture or dislocation. Pelvic calcifications consistent with phleboliths. IMPRESSION: No acute abnormality. Electronically Signed   By: Maisie Fus  Register   On: 03/11/2015 12:26     [facility] Course: -Brittany Foster was admitted to the day infusion center for extended observation.  -Started D5.45 at 125 ml per hour for cellular rehydration.  -Toradol 30 mg IV times one for inflammation -Patient was started on a high concentration dilaudid PCA. She used a total of 15 mg with 31 demands and 30 deliveries. She reports that pain intensity is  7/10. She received Percocet 10-325 mg 1 hour prior to discharge.  Patient can manage at home on current medication regimen.  -She is alert, oriented, and ambulatory.  -Patient is to follow up in office.  -Recommend that she continue rest and increase hydration to 64 ounce of water every other hour.    Physical Exam at Discharge: BP 114/63 mmHg  Pulse 76  Temp(Src) 98.4 F (36.9 C) (Oral)  Resp 12  Ht 5' (1.524 m)  Wt 122 lb (55.339 kg)  BMI 23.83 kg/m2  SpO2 100%  General Appearance:    Alert, cooperative, no distress, appears stated age  Head:    Normocephalic, without obvious abnormality, atraumatic  Eyes:    PERRL, conjunctiva/corneas clear, EOM's intact, fundi    benign, both eyes  Back:     Symmetric, no curvature, ROM normal, no CVA tenderness  Lungs:     Clear to auscultation bilaterally, respirations unlabored  Chest  Wall:    No tenderness or deformity   Heart:    Regular rate and rhythm, S1 and S2 normal, no murmur, rub   or gallop  Extremities:   Extremities normal, atraumatic, no cyanosis or edema  Pulses:   2+ and symmetric all extremities  Skin:   Skin color, texture, turgor normal, no rashes or lesions  Lymph nodes:   Cervical, supraclavicular, and axillary nodes normal  Neurologic:   CNII-XII intact, normal strength, sensation and reflexes    throughout   Disposition at Discharge: 01-Home or Self Care  Discharge Orders: Discharge Instructions    Discharge patient    Complete by:  As directed            Condition at Discharge:   Stable  Time spent on Discharge:  15 minutes  Signed: Razi Hickle M 03/22/2015, 4:31 PM

## 2015-03-22 NOTE — Telephone Encounter (Signed)
Called patient back and told her that she could come to the Day hospital for treatment after speaking with the provider. Pt voiced understanding.

## 2015-03-22 NOTE — Telephone Encounter (Signed)
Pt called requesting to come to the Day hospital for treatment. Pt stated pain was 8/10, in her ribs and back. Denies fever, chest pain, nausea or vomiting, abd pain. Took last pain med at 630 this morning. Will check with provider and call her back. Pt voiced understanding.

## 2015-03-26 ENCOUNTER — Ambulatory Visit (INDEPENDENT_AMBULATORY_CARE_PROVIDER_SITE_OTHER): Payer: Medicaid Other | Admitting: Internal Medicine

## 2015-03-26 ENCOUNTER — Encounter: Payer: Self-pay | Admitting: Internal Medicine

## 2015-03-26 ENCOUNTER — Non-Acute Institutional Stay (HOSPITAL_COMMUNITY)
Admission: AD | Admit: 2015-03-26 | Discharge: 2015-03-26 | Disposition: A | Discharge status: Home / Self Care | Payer: Medicaid Other | Source: Ambulatory Visit | Attending: Internal Medicine | Admitting: Internal Medicine

## 2015-03-26 ENCOUNTER — Encounter (HOSPITAL_COMMUNITY): Payer: Self-pay

## 2015-03-26 ENCOUNTER — Telehealth (HOSPITAL_COMMUNITY): Payer: Self-pay | Admitting: *Deleted

## 2015-03-26 VITALS — BP 95/50 | HR 97 | Temp 97.8°F | Resp 18 | Ht 60.0 in | Wt 126.0 lb

## 2015-03-26 DIAGNOSIS — Z79899 Other long term (current) drug therapy: Secondary | ICD-10-CM | POA: Diagnosis not present

## 2015-03-26 DIAGNOSIS — D5700 Hb-SS disease with crisis, unspecified: Secondary | ICD-10-CM | POA: Insufficient documentation

## 2015-03-26 DIAGNOSIS — D571 Sickle-cell disease without crisis: Secondary | ICD-10-CM | POA: Diagnosis not present

## 2015-03-26 DIAGNOSIS — Z791 Long term (current) use of non-steroidal anti-inflammatories (NSAID): Secondary | ICD-10-CM | POA: Diagnosis not present

## 2015-03-26 DIAGNOSIS — R52 Pain, unspecified: Secondary | ICD-10-CM | POA: Diagnosis present

## 2015-03-26 MED ORDER — SODIUM CHLORIDE 0.9% FLUSH
10.0000 mL | INTRAVENOUS | Status: DC | PRN
  Filled 2015-03-26: qty 10

## 2015-03-26 MED ORDER — SODIUM CHLORIDE 0.9 % IJ SOLN: 9.0000 mL | INTRAMUSCULAR | Status: DC | PRN

## 2015-03-26 MED ORDER — MORPHINE SULFATE ER 30 MG PO TBCR: 30.0000 mg | EXTENDED_RELEASE_TABLET | Freq: Two times a day (BID) | ORAL | Status: DC

## 2015-03-26 MED ORDER — DEXTROSE-NACL 5-0.45 % IV SOLN
INTRAVENOUS | Status: DC
  Administered 2015-03-26: 12:00:00 via INTRAVENOUS

## 2015-03-26 MED ORDER — POLYETHYLENE GLYCOL 3350 17 G PO PACK: 17.0000 g | PACK | Freq: Every day | ORAL | Status: DC | PRN

## 2015-03-26 MED ORDER — SODIUM CHLORIDE 0.9 % IV SOLN
25.0000 mg | INTRAVENOUS | Status: DC | PRN
  Administered 2015-03-26: 25 mg via INTRAVENOUS
  Filled 2015-03-26 (×3): qty 0.5

## 2015-03-26 MED ORDER — OXYCODONE-ACETAMINOPHEN 10-325 MG PO TABS: 1.0000 | ORAL_TABLET | Freq: Four times a day (QID) | ORAL | Status: DC | PRN

## 2015-03-26 MED ORDER — HYDROMORPHONE 1 MG/ML IV SOLN
INTRAVENOUS | Status: DC
  Administered 2015-03-26: 12:00:00 via INTRAVENOUS
  Filled 2015-03-26: qty 25

## 2015-03-26 MED ORDER — HEPARIN SOD (PORK) LOCK FLUSH 100 UNIT/ML IV SOLN
500.0000 [IU] | INTRAVENOUS | Status: DC | PRN
  Filled 2015-03-26: qty 5

## 2015-03-26 MED ORDER — DIPHENHYDRAMINE HCL 25 MG PO CAPS
25.0000 mg | ORAL_CAPSULE | ORAL | Status: DC | PRN
Start: 2015-03-26 — End: 2015-03-26

## 2015-03-26 MED ORDER — SENNOSIDES-DOCUSATE SODIUM 8.6-50 MG PO TABS: 1.0000 | ORAL_TABLET | Freq: Two times a day (BID) | ORAL | Status: DC

## 2015-03-26 MED ORDER — KETOROLAC TROMETHAMINE 30 MG/ML IJ SOLN
30.0000 mg | Freq: Once | INTRAMUSCULAR | Status: AC
Start: 2015-03-26 — End: 2015-03-26
  Administered 2015-03-26: 30 mg via INTRAVENOUS
  Filled 2015-03-26: qty 1

## 2015-03-26 MED ORDER — FOLIC ACID 1 MG PO TABS
1.0000 mg | ORAL_TABLET | Freq: Every day | ORAL | Status: DC
  Administered 2015-03-26: 1 mg via ORAL
  Filled 2015-03-26: qty 1

## 2015-03-26 MED ORDER — NALOXONE HCL 0.4 MG/ML IJ SOLN: 0.4000 mg | INTRAMUSCULAR | Status: DC | PRN

## 2015-03-26 NOTE — Progress Notes (Signed)
Patient ID: Brittany Foster, female   DOB: March 17, 1985, 30 y.o.   MRN: [id number]   Brittany Foster, is a 30 y.o. female  [id number]  [id number]  DOB - 21-Mar-1985  Chief Complaint  Patient presents with  . Follow-up    Medication Management SCD        Subjective:   Brittany Foster is a 30 y.o. female with history of sickle cell disease and chronic pain syndrome here today for a follow up visit. Patient is complaining of pain 10 out of 10, generalized but mostly in her mouth and head. Patient went to see a dentist a few days ago, she is now scheduled for 6 teeth extraction next week because of severe dental caries. She is currently on antibiotics. Patient claims her current pain medication is not helping especially now with her tooth ache. She would like to go to the day hospital for observation and extended pain management. She is currently taking Percocet 10-325 milligrams tablet by mouth every 4 hourly when necessary, she claims her long-acting pain medication, Belbuca, is no longer helping. She is willing to try a new long-acting pain medication, including MS Contin. When asked about her allergy to morphine, she claims it was long time ago and it was itching while she was in the hospital with some rashes. She is willing to give it a try again. She has no fever, no chest pain, no nausea or vomiting, no diarrhea or constipation, no urinary symptoms. No jaundice. No problems updated.  ALLERGIES: Allergies  Allergen Reactions  . Ultram [Tramadol] Other (See Comments)    seizures  . Zofran [Ondansetron]   . Morphine And Related Hives, Rash and Other (See Comments)    Shaking Tolerates Percocet, Norco, and buprenorphine  . Tape Rash    PAST MEDICAL HISTORY: Past Medical History  Diagnosis Date  . Sickle cell anemia (HCC)   . Anemia     MEDICATIONS AT HOME: Prior to Admission medications   Medication Sig Start Date End Date Taking? Authorizing Provider  acetaminophen (TYLENOL) 325  MG tablet Take 650 mg by mouth every 6 (six) hours as needed for mild pain.   Yes Historical Provider, MD  Buprenorphine HCl (BELBUCA) 75 MCG FILM Place 75 mcg inside cheek 2 (two) times daily. 01/08/15  Yes Quentin Angst, MD  DULoxetine (CYMBALTA) 60 MG capsule Take 1 capsule (60 mg total) by mouth daily. 01/08/15  Yes Quentin Angst, MD  folic acid (FOLVITE) 1 MG tablet Take 1 tablet (1 mg total) by mouth daily. 01/08/15  Yes Quentin Angst, MD  hydroxyurea (HYDREA) 500 MG capsule Take 2 capsules (1,000 mg total) by mouth daily. May take with food to minimize GI side effects. 11/09/14  Yes [name], FNP  ibuprofen (ADVIL,MOTRIN) 200 MG tablet Take 200 mg by mouth every 6 (six) hours as needed for moderate pain.   Yes Historical Provider, MD  oxyCODONE-acetaminophen (PERCOCET) 10-325 MG tablet Take 1 tablet by mouth every 6 (six) hours as needed for pain. No Refill before 02/07/2015 03/13/15  Yes [name], FNP  potassium chloride SA (K-DUR,KLOR-CON) 20 MEQ tablet Take 1 tablet (20 mEq total) by mouth daily. 01/08/15  Yes Quentin Angst, MD  Topiramate ER 100 MG CP24 Take 100 mg by mouth at bedtime.   Yes Historical Provider, MD  Cholecalciferol (VITAMIN D3) 5000 UNITS CAPS Take 1 capsule (5,000 Units total) by mouth daily. Patient not taking: Reported on 03/07/2015 01/08/15   Quentin Angst,  MD  gabapentin (NEURONTIN) 300 MG capsule Take 1 capsule (300 mg total) by mouth 3 (three) times daily. Patient not taking: Reported on 03/26/2015 02/14/14   Altha Harm, MD     Objective:   Filed Vitals:   03/26/15 1009  BP: 95/50  Pulse: 97  Temp: 97.8 F (36.6 C)  TempSrc: Oral  Resp: 18  Height: 5' (1.524 m)  Weight: 126 lb (57.153 kg)    Exam General appearance : Awake, alert, in some pain distress, tearful, Speech Clear.  HEENT: Atraumatic and Normocephalic, pupils equally reactive to light and accomodation Neck: supple, no JVD. No cervical  lymphadenopathy.  Chest: Good air entry bilaterally, no added sounds  CVS: S1 S2 regular, no murmurs.  Abdomen: Bowel sounds present, Non tender and not distended with no gaurding, rigidity or rebound. Extremities: B/L Lower Ext shows no edema, both legs are warm to touch Neurology: Awake alert, and oriented X 3, CN II-XII intact, Non focal Skin:No Rash   Assessment & Plan   1. Hb-SS disease with crisis (HCC)  - Place in observation (patient's expected length of stay will be less than 2 midnights) - Day Hospital for IVF and IV pain medications.  Percocet was refilled today for 60 tablets with no refill Patient to start MS Contin 30 mg tablet by mouth twice a day, prescription was given for 30 tablets with specific instruction about reporting to the ED immediately or to the clinic if she notices any rash or intractable itching or if she experiences any form of unbearable side effects D/C Belbuca  Patient have been counseled extensively about nutrition and exercise  Return in about 4 weeks (around 04/23/2015) for Sickle Cell Disease/Pain.  The patient was given clear instructions to go to ER or return to medical center if symptoms don't improve, worsen or new problems develop. The patient verbalized understanding. The patient was told to call to get lab results if they haven't heard anything in the next week.   This note has been created with Education officer, environmental. Any transcriptional errors are unintentional.    Jeanann Lewandowsky, MD, MHA, Maxwell Caul, CPE [facility] and [facility] [place], [place] [phone]   03/26/2015, 10:35 AM

## 2015-03-26 NOTE — Patient Instructions (Signed)
Sickle Cell Anemia, Adult Sickle cell anemia is a condition in which red blood cells have an abnormal "sickle" shape. This abnormal shape shortens the cells' life span, which results in a lower than normal concentration of red blood cells in the blood. The sickle shape also causes the cells to clump together and block free blood flow through the blood vessels. As a result, the tissues and organs of the body do not receive enough oxygen. Sickle cell anemia causes organ damage and pain and increases the risk of infection. CAUSES  Sickle cell anemia is a genetic disorder. Those who receive two copies of the gene have the condition, and those who receive one copy have the trait. RISK FACTORS The sickle cell gene is most common in people whose families originated in [place]. Other areas of the globe where sickle cell trait occurs include the Mediterranean, [place] and [place], the [place], and the [place].  SIGNS AND SYMPTOMS  Pain, especially in the extremities, back, chest, or abdomen (common). The pain may start suddenly or may develop following an illness, especially if there is dehydration. Pain can also occur due to overexertion or exposure to extreme temperature changes.  Frequent severe bacterial infections, especially certain types of pneumonia and meningitis.  Pain and swelling in the hands and feet.  Decreased activity.   Loss of appetite.   Change in behavior.  Headaches.  Seizures.  Shortness of breath or difficulty breathing.  Vision changes.  Skin ulcers. Those with the trait may not have symptoms or they may have mild symptoms.  DIAGNOSIS  Sickle cell anemia is diagnosed with blood tests that demonstrate the genetic trait. It is often diagnosed during the newborn period, due to mandatory testing nationwide. A variety of blood tests, X-rays, CT scans, MRI scans, ultrasounds, and lung function tests may also be done to monitor the condition. TREATMENT  Sickle  cell anemia may be treated with:  Medicines. You may be given pain medicines, antibiotic medicines (to treat and prevent infections) or medicines to increase the production of certain types of hemoglobin.  Fluids.  Oxygen.  Blood transfusions. HOME CARE INSTRUCTIONS   Drink enough fluid to keep your urine clear or pale yellow. Increase your fluid intake in hot weather and during exercise.  Do not smoke. Smoking lowers oxygen levels in the blood.   Only take over-the-counter or prescription medicines for pain, fever, or discomfort as directed by your health care provider.  Take antibiotics as directed by your health care provider. Make sure you finish them it even if you start to feel better.   Take supplements as directed by your health care provider.   Consider wearing a medical alert bracelet. This tells anyone caring for you in an emergency of your condition.   When traveling, keep your medical information, health care provider's names, and the medicines you take with you at all times.   If you develop a fever, do not take medicines to reduce the fever right away. This could cover up a problem that is developing. Notify your health care provider.  Keep all follow-up appointments with your health care provider. Sickle cell anemia requires regular medical care. SEEK MEDICAL CARE IF: You have a fever. SEEK IMMEDIATE MEDICAL CARE IF:   You feel dizzy or faint.   You have new abdominal pain, especially on the left side near the stomach area.   You develop a persistent, often uncomfortable and painful penile erection (priapism). If this is not treated immediately it   will lead to impotence.   You have numbness your arms or legs or you have a hard time moving them.   You have a hard time with speech.   You have a fever or persistent symptoms for more than 2-3 days.   You have a fever and your symptoms suddenly get worse.   You have signs or symptoms of infection.  These include:   Chills.   Abnormal tiredness (lethargy).   Irritability.   Poor eating.   Vomiting.   You develop pain that is not helped with medicine.   You develop shortness of breath.  You have pain in your chest.   You are coughing up pus-like or bloody sputum.   You develop a stiff neck.  Your feet or hands swell or have pain.  Your abdomen appears bloated.  You develop joint pain. MAKE SURE YOU:  Understand these instructions.   This information is not intended to replace advice given to you by your health care provider. Make sure you discuss any questions you have with your health care provider.   Document Released: 05/27/2005 Document Revised: 03/09/2014 Document Reviewed: 09/28/2012 Elsevier Interactive Patient Education 2016 [employer]  

## 2015-03-26 NOTE — Progress Notes (Signed)
Patient here for Medication.  Patient complains of SCD crisis. Patient is requesting day hospital. Pain is scaled currently at a 9.

## 2015-03-26 NOTE — Discharge Summary (Signed)
Physician Discharge Summary  Brittany Foster EAV:[id number] DOB: [birth year] DOA: 03/26/2015  PCP: Jeanann Lewandowsky, MD  Admit date: 03/26/2015  Discharge date: 03/26/2015  Time spent: 30 minutes  Discharge Diagnoses:  Active Problems:   Sickle cell anemia with pain [facility])   Discharge Condition: Stable  Diet recommendation: Regular  History of present illness:  Brittany Foster is a 30 y.o. female with history of sickle cell disease and chronic pain syndrome here today for a follow up visit. Patient is complaining of pain 10 out of 10, generalized but mostly in her head. Patient claims she went to see a dentist a few days ago, she is scheduled for 6 teeth extraction next week because of severe dental caries. She is currently on antibiotics. Patient claims her current pain medication is not helping especially now with her tooth ache. She would like to go to the day hospital for observation and extended pain management. She is currently taking Percocet 10-25 milligrams tablet by mouth every 4 hourly when necessary, she claims her long-acting pain medication and Belbuca is no longer helping. She is willing to try a new long-acting pain medication, including MS Contin. When asked about her allergy to morphine, she claims it was long time ago and it was itching while she was in the hospital with some rashes. She is willing to give it a try again. She has no fever, no chest pain, no nausea or vomiting, no diarrhea or constipation, no urinary symptoms. No jaundice.   Hospital Course:   Brittany Foster was admitted to the day hospital with sickle cell painful crisis. Patient was treated with weight based IV Dilaudid PCA, IV Toradol as well as IV fluids. Brittany Foster showed significant improvement symptomatically and pain went down from 10 to 4 out of 10 at the time of discharge. Brittany Foster will follow-up in the clinic as previously scheduled, continue with home medications as per prior to admission. Follow-up with  dentist for tooth extraction next week as scheduled. Start MS Contin 30 mg tablet by mouth twice a day, report any side effects, stop if unbearable itching or rashes and onto the clinic. Stop smoking. Take hydroxyurea as prescribed.  Discharge Exam: Filed Vitals:   03/26/15 1510 03/26/15 1610  BP: 103/61 103/59  Pulse: 88 88  Temp:    Resp: 15 15    General appearance: alert, cooperative and no distress Eyes: conjunctivae/corneas clear. PERRL, EOM's intact. Fundi benign. Neck: no adenopathy, no carotid bruit, no JVD, supple, symmetrical, trachea midline and thyroid not enlarged, symmetric, no tenderness/mass/nodules Back: symmetric, no curvature. ROM normal. No CVA tenderness. Resp: clear to auscultation bilaterally Chest wall: no tenderness Cardio: regular rate and rhythm, S1, S2 normal, no murmur, click, rub or gallop GI: soft, non-tender; bowel sounds normal; no masses, no organomegaly Extremities: extremities normal, atraumatic, no cyanosis or edema Pulses: 2+ and symmetric Skin: Skin color, texture, turgor normal. No rashes or lesions Neurologic: Grossly normal  Discharge Instructions We discussed the need for good hydration, monitoring of hydration status, avoidance of heat, cold, stress, and infection triggers. We discussed the need to be compliant with taking Hydrea. Brittany Foster was reminded of the need to seek medical attention of any symptoms of bleeding, anemia, or infection occurs.  Discharge Instructions    Discharge patient    Complete by:  As directed           Current Discharge Medication List    CONTINUE these medications which have NOT CHANGED   Details  acetaminophen (TYLENOL) 325 MG  tablet Take 650 mg by mouth every 6 (six) hours as needed for mild pain.    Cholecalciferol (VITAMIN D3) 5000 UNITS CAPS Take 1 capsule (5,000 Units total) by mouth daily. Qty: 30 capsule, Refills: 5   Associated Diagnoses: Vitamin D deficiency    DULoxetine (CYMBALTA) 60 MG  capsule Take 1 capsule (60 mg total) by mouth daily. Qty: 30 capsule, Refills: 3   Associated Diagnoses: Chronic pain syndrome    folic acid (FOLVITE) 1 MG tablet Take 1 tablet (1 mg total) by mouth daily. Qty: 90 tablet, Refills: 11   Associated Diagnoses: Hb-SS disease without crisis (HCC)    gabapentin (NEURONTIN) 300 MG capsule Take 1 capsule (300 mg total) by mouth 3 (three) times daily. Qty: 90 capsule, Refills: 2    hydroxyurea (HYDREA) 500 MG capsule Take 2 capsules (1,000 mg total) by mouth daily. May take with food to minimize GI side effects. 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    oxyCODONE-acetaminophen (PERCOCET) 10-325 MG tablet Take 1 tablet by mouth every 6 (six) hours as needed for pain. No Refill before 02/07/2015 Qty: 60 tablet, Refills: 0   Associated Diagnoses: Hb-SS disease without crisis (HCC)    potassium chloride SA (K-DUR,KLOR-CON) 20 MEQ tablet Take 1 tablet (20 mEq total) by mouth daily. Qty: 30 tablet, Refills: 2   Associated Diagnoses: Hypokalemia    Topiramate ER 100 MG CP24 Take 100 mg by mouth at bedtime.      STOP taking these medications     Buprenorphine HCl (BELBUCA) 75 MCG FILM        Allergies  Allergen Reactions  . Ultram [Tramadol] Other (See Comments)    seizures  . Zofran [Ondansetron]   . Morphine And Related Hives, Rash and Other (See Comments)    Shaking Tolerates Percocet, Norco, and buprenorphine  . Tape Rash     Significant Diagnostic Studies: Dg Chest 2 View  03/07/2015  CLINICAL DATA:  Chest pain with Short of breath.  Sickle cell. EXAM: CHEST  2 VIEW COMPARISON:  03/03/2015 FINDINGS: Port-A-Cath tip in the right atrium unchanged Heart size normal. No heart failure. Lungs are clear without infiltrate effusion or mass. No change from the prior study.  IMPRESSION: No active cardiopulmonary disease. Electronically Signed   By: Marlan Palau M.D.   On: 03/07/2015 13:22   Dg Hips Bilat With Pelvis Min 5 Views  03/11/2015  CLINICAL DATA:  Hip pain. EXAM: DG HIP (WITH OR WITHOUT PELVIS) 5+V BILAT COMPARISON:  None. FINDINGS: No acute bony or joint abnormality identified. No evidence of fracture or dislocation. Pelvic calcifications consistent with phleboliths. IMPRESSION: No acute abnormality. Electronically Signed   By: Maisie Fus  Register   On: 03/11/2015 12:26    Signed:  Jeanann Lewandowsky MD, MHA, FACP, FAAP, CPE   03/26/2015, 5:03 PM

## 2015-03-26 NOTE — Progress Notes (Signed)
Admitted to Sickle Cell clinic for  treatment of pain 9/10 left arm and back. Treated with PCA dilaudid, toradol, IV fluids. Pain 8/10 at discharge. Pt. Understands discharge instructions and has a ride home. Portacath deaccessed after flushing with saline and heparin flush. Has prescription.

## 2015-03-26 NOTE — H&P (Signed)
[facility] Physical  Brittany Foster ZOX:[id number] DOB: 21-Sep-1985 DOA: 03/26/2015  PCP: Jeanann Lewandowsky, MD   Chief Complaint: No chief complaint on file.   HPI: Brittany Foster is a 30 y.o. female with history of sickle cell disease and chronic pain syndrome here today for a follow up visit. Patient is complaining of pain 10 out of 10, generalized but mostly in her head. Patient claims she went to see a dentist a few days ago, she is scheduled for 6 teeth extraction next week because of severe dental caries. She is currently on antibiotics. Patient claims her current pain medication is not helping especially now with her tooth ache. She would like to go to the day hospital for observation and extended pain management. She is currently taking Percocet 10-25 milligrams tablet by mouth every 4 hourly when necessary, she claims her long-acting pain medication and Belbuca is no longer helping. She is willing to try a new long-acting pain medication, including MS Contin. When asked about her allergy to morphine, she claims it was long time ago and it was itching while she was in the hospital with some rashes. She is willing to give it a try again. She has no fever, no chest pain, no nausea or vomiting, no diarrhea or constipation, no urinary symptoms. No jaundice.   Systemic Review: General: The patient denies anorexia, fever, weight loss Cardiac: Denies chest pain, syncope, palpitations, pedal edema  Respiratory: Denies cough, shortness of breath, wheezing GI: Denies severe indigestion/heartburn, abdominal pain, nausea, vomiting, diarrhea and constipation GU: Denies hematuria, incontinence, dysuria  Musculoskeletal: Denies arthritis  Skin: Denies suspicious skin lesions Neurologic: Denies focal weakness or numbness, change in vision  Past Medical History  Diagnosis Date  . Sickle cell anemia (HCC)   . Anemia     Past Surgical History  Procedure Laterality Date  .  Tubal ligation    . Cholecystectomy    . Cesarean section    . Port a cath placement Right     about 6-7 years ago  . Cholecystectomy  2000    Allergies  Allergen Reactions  . Ultram [Tramadol] Other (See Comments)    seizures  . Zofran [Ondansetron]   . Morphine And Related Hives, Rash and Other (See Comments)    Shaking Tolerates Percocet, Norco, and buprenorphine  . Tape Rash    Family History  Problem Relation Age of Onset  . Sickle cell anemia Other   . Sickle cell trait Father   . Sickle cell trait Mother       Prior to Admission medications   Medication Sig Start Date End Date Taking? Authorizing Provider  acetaminophen (TYLENOL) 325 MG tablet Take 650 mg by mouth every 6 (six) hours as needed for mild pain.    Historical Provider, MD  Buprenorphine HCl (BELBUCA) 75 MCG FILM Place 75 mcg inside cheek 2 (two) times daily. 01/08/15   Quentin Angst, MD  Cholecalciferol (VITAMIN D3) 5000 UNITS CAPS Take 1 capsule (5,000 Units total) by mouth daily. Patient not taking: Reported on 03/07/2015 01/08/15   Quentin Angst, MD  DULoxetine (CYMBALTA) 60 MG capsule Take 1 capsule (60 mg total) by mouth daily. 01/08/15   Quentin Angst, MD  folic acid (FOLVITE) 1 MG tablet Take 1 tablet (1 mg total) by mouth daily. 01/08/15   Quentin Angst, MD  gabapentin (NEURONTIN) 300 MG capsule Take 1 capsule (300 mg total) by mouth 3 (three) times daily. Patient not taking:  Reported on 03/26/2015 02/14/14   Altha Harm, MD  hydroxyurea (HYDREA) 500 MG capsule Take 2 capsules (1,000 mg total) by mouth daily. May take with food to minimize GI side effects. 11/09/14   [name], FNP  ibuprofen (ADVIL,MOTRIN) 200 MG tablet Take 200 mg by mouth every 6 (six) hours as needed for moderate pain.    Historical Provider, MD  oxyCODONE-acetaminophen (PERCOCET) 10-325 MG tablet Take 1 tablet by mouth every 6 (six) hours as needed for pain. No Refill before 02/07/2015 03/13/15    [name], FNP  potassium chloride SA (K-DUR,KLOR-CON) 20 MEQ tablet Take 1 tablet (20 mEq total) by mouth daily. 01/08/15   Quentin Angst, MD  Topiramate ER 100 MG CP24 Take 100 mg by mouth at bedtime.    Historical Provider, MD     Physical Exam: Filed Vitals:   03/26/15 1056  BP: 100/60  Pulse: 84  Temp: 97.8 F (36.6 C)  TempSrc: Oral  Resp: 18  SpO2: 99%    General: Alert, awake, afebrile, anicteric, not in obvious distress HEENT: Normocephalic and Atraumatic, Mucous membranes pink                PERRLA; EOM intact; No scleral icterus,                 Nares: Patent, Oropharynx: Clear, Poor Dentition                 Neck: FROM, no cervical lymphadenopathy, thyromegaly, carotid bruit or JVD;  CHEST WALL: No tenderness  CHEST: Normal respiration, clear to auscultation bilaterally  HEART: Regular rate and rhythm; no murmurs rubs or gallops  BACK: No kyphosis or scoliosis; no CVA tenderness  ABDOMEN: Positive Bowel Sounds, soft, non-tender; no masses, no organomegaly Rectal Exam: deferred EXTREMITIES: No cyanosis, clubbing, or edema SKIN:  no rash or ulceration  CNS: Alert and Oriented x 4, Nonfocal exam, CN 2-12 intact  Labs on Admission:  Basic Metabolic Panel: No results for input(s): NA, K, CL, CO2, GLUCOSE, BUN, CREATININE, CALCIUM, MG, PHOS in the last 168 hours. Liver Function Tests: No results for input(s): AST, ALT, ALKPHOS, BILITOT, PROT, ALBUMIN in the last 168 hours. No results for input(s): LIPASE, AMYLASE in the last 168 hours. No results for input(s): AMMONIA in the last 168 hours. CBC: No results for input(s): WBC, NEUTROABS, HGB, HCT, MCV, PLT in the last 168 hours. Cardiac Enzymes: No results for input(s): CKTOTAL, CKMB, CKMBINDEX, TROPONINI in the last 168 hours.  BNP (last 3 results)  Recent Labs  04/29/14 1459  BNP 114.5*    ProBNP (last 3 results) No results for input(s): PROBNP in the last 8760 hours.  CBG: No results for  input(s): GLUCAP in the last 168 hours.   Assessment/Plan Active Problems:   Sickle cell anemia with pain (HCC)   Admits to the Day Hospital  IVF D5 .45% Saline @ 125 mls/hour  Weight based Dilaudid PCA started within 30 minutes of admission  IV Toradol 30 mg once  Monitor vitals very closely, Re-evaluate pain scale every hour  Oxygen by [place]  Patient will be re-evaluated for pain in the context of function and relationship to baseline as care progresses.  If no significant relieve from pain (remains above 5/10) will transfer patient to inpatient services for further evaluation and management  Code Status: Full  Family Communication: None  DVT Prophylaxis: Ambulate as tolerated   Time spent: 35 Minutes  Brittany Souter, MD, MHA, FACP, FAAP,  CPE  If 7PM-7AM, please contact night-coverage [email or web address] 03/26/2015, 11:14 AM

## 2015-03-26 NOTE — Telephone Encounter (Signed)
Incoming call from a female person calling in for Iliany stating that she is in crisis and wants to come directly to [facility] for treatment when she comes in for her office appointment today. Darlinda crying in the background. Informed female on phone to ask Travia to come in for her visit, that we would notify the medical provider. Telford Nab NP notified and she would like Donyea to come in for her appointment.

## 2015-04-22 ENCOUNTER — Telehealth: Payer: Self-pay | Admitting: *Deleted

## 2015-04-22 NOTE — Telephone Encounter (Signed)
Refill request for Percocet , LOV 03/26/2015. Please advise. Thanks!

## 2015-04-22 NOTE — Telephone Encounter (Signed)
Pt called and left a msg on voicemail on Friday 04/19/15 for a refill of her medication . She needs a refill of her Percocet. Please advise provider. Thanks

## 2015-04-23 ENCOUNTER — Other Ambulatory Visit: Payer: Self-pay | Admitting: Internal Medicine

## 2015-04-23 DIAGNOSIS — D571 Sickle-cell disease without crisis: Secondary | ICD-10-CM

## 2015-04-23 MED ORDER — OXYCODONE-ACETAMINOPHEN 10-325 MG PO TABS: 1.0000 | ORAL_TABLET | Freq: Four times a day (QID) | ORAL | Status: DC | PRN

## 2015-04-23 MED ORDER — MORPHINE SULFATE ER 30 MG PO TBCR: 30.0000 mg | EXTENDED_RELEASE_TABLET | Freq: Two times a day (BID) | ORAL | Status: DC

## 2015-04-25 ENCOUNTER — Non-Acute Institutional Stay (HOSPITAL_COMMUNITY)
Admission: AD | Admit: 2015-04-25 | Discharge: 2015-04-25 | Disposition: A | Discharge status: Home / Self Care | Payer: Medicaid Other | Source: Ambulatory Visit | Attending: Internal Medicine | Admitting: Internal Medicine

## 2015-04-25 ENCOUNTER — Telehealth (HOSPITAL_COMMUNITY): Payer: Self-pay | Admitting: Hematology

## 2015-04-25 DIAGNOSIS — D5700 Hb-SS disease with crisis, unspecified: Secondary | ICD-10-CM | POA: Diagnosis present

## 2015-04-25 DIAGNOSIS — Z79899 Other long term (current) drug therapy: Secondary | ICD-10-CM | POA: Diagnosis not present

## 2015-04-25 DIAGNOSIS — Z79891 Long term (current) use of opiate analgesic: Secondary | ICD-10-CM | POA: Insufficient documentation

## 2015-04-25 DIAGNOSIS — Z87891 Personal history of nicotine dependence: Secondary | ICD-10-CM | POA: Insufficient documentation

## 2015-04-25 DIAGNOSIS — D571 Sickle-cell disease without crisis: Secondary | ICD-10-CM | POA: Diagnosis present

## 2015-04-25 DIAGNOSIS — Z9049 Acquired absence of other specified parts of digestive tract: Secondary | ICD-10-CM | POA: Diagnosis not present

## 2015-04-25 LAB — CBC WITH DIFFERENTIAL/PLATELET
BASOS ABS: 0 10*3/uL (ref 0.0–0.1)
Basophils Relative: 0 %
EOS PCT: 1 %
Eosinophils Absolute: 0.1 10*3/uL (ref 0.0–0.7)
HCT: 23.6 % — ABNORMAL LOW (ref 36.0–46.0)
Hemoglobin: 7.9 g/dL — ABNORMAL LOW (ref 12.0–15.0)
Lymphocytes Relative: 28 %
Lymphs Abs: 4.1 10*3/uL — ABNORMAL HIGH (ref 0.7–4.0)
MCH: 32.6 pg (ref 26.0–34.0)
MCHC: 33.5 g/dL (ref 30.0–36.0)
MCV: 97.5 fL (ref 78.0–100.0)
MONO ABS: 1 10*3/uL (ref 0.1–1.0)
Monocytes Relative: 7 %
NEUTROS PCT: 64 %
Neutro Abs: 9.4 10*3/uL — ABNORMAL HIGH (ref 1.7–7.7)
PLATELETS: 312 10*3/uL (ref 150–400)
RBC: 2.42 MIL/uL — AB (ref 3.87–5.11)
RDW: 23.9 % — ABNORMAL HIGH (ref 11.5–15.5)
WBC: 14.6 10*3/uL — AB (ref 4.0–10.5)

## 2015-04-25 LAB — COMPREHENSIVE METABOLIC PANEL
ALBUMIN: 4 g/dL (ref 3.5–5.0)
ALT: 25 U/L (ref 14–54)
AST: 33 U/L (ref 15–41)
Alkaline Phosphatase: 90 U/L (ref 38–126)
Anion gap: 6 (ref 5–15)
BUN: 7 mg/dL (ref 6–20)
CHLORIDE: 108 mmol/L (ref 101–111)
CO2: 25 mmol/L (ref 22–32)
Calcium: 8.6 mg/dL — ABNORMAL LOW (ref 8.9–10.3)
Creatinine, Ser: 0.4 mg/dL — ABNORMAL LOW (ref 0.44–1.00)
GFR calc Af Amer: >60 mL/min (ref >60)
GLUCOSE: 150 mg/dL — AB (ref 65–99)
POTASSIUM: 3 mmol/L — AB (ref 3.5–5.1)
Sodium: 139 mmol/L (ref 135–145)
Total Bilirubin: 1.8 mg/dL — ABNORMAL HIGH (ref 0.3–1.2)
Total Protein: 7.4 g/dL (ref 6.5–8.1)

## 2015-04-25 LAB — RETICULOCYTES
RBC.: 2.42 MIL/uL — ABNORMAL LOW (ref 3.87–5.11)
RETIC COUNT ABSOLUTE: 283.1 10*3/uL — AB (ref 19.0–186.0)
RETIC CT PCT: 11.7 % — AB (ref 0.4–3.1)

## 2015-04-25 MED ORDER — DEXTROSE-NACL 5-0.45 % IV SOLN
INTRAVENOUS | Status: DC
  Administered 2015-04-25: 12:00:00 via INTRAVENOUS

## 2015-04-25 MED ORDER — ALTEPLASE 2 MG IJ SOLR
2.0000 mg | Freq: Once | INTRAMUSCULAR | Status: AC
  Administered 2015-04-25: 2 mg
  Filled 2015-04-25: qty 2

## 2015-04-25 MED ORDER — NALOXONE HCL 0.4 MG/ML IJ SOLN: 0.4000 mg | INTRAMUSCULAR | Status: DC | PRN

## 2015-04-25 MED ORDER — SODIUM CHLORIDE 0.9% FLUSH
10.0000 mL | INTRAVENOUS | Status: AC | PRN
  Administered 2015-04-25: 10 mL

## 2015-04-25 MED ORDER — HYDROMORPHONE HCL 2 MG/ML IJ SOLN
2.0000 mg | Freq: Once | INTRAMUSCULAR | Status: AC
Start: 2015-04-25 — End: 2015-04-25
  Administered 2015-04-25: 2 mg via SUBCUTANEOUS
  Filled 2015-04-25: qty 1

## 2015-04-25 MED ORDER — HYDROMORPHONE 1 MG/ML IV SOLN
INTRAVENOUS | Status: DC
  Administered 2015-04-25: 11.5 mg via INTRAVENOUS
  Administered 2015-04-25: 12:00:00 via INTRAVENOUS
  Filled 2015-04-25: qty 25

## 2015-04-25 MED ORDER — OXYCODONE-ACETAMINOPHEN 5-325 MG PO TABS
1.0000 | ORAL_TABLET | Freq: Once | ORAL | Status: AC
  Administered 2015-04-25: 1 via ORAL
  Filled 2015-04-25: qty 1

## 2015-04-25 MED ORDER — ONDANSETRON HCL 4 MG/2ML IJ SOLN: 4.0000 mg | Freq: Four times a day (QID) | INTRAMUSCULAR | Status: DC | PRN

## 2015-04-25 MED ORDER — SODIUM CHLORIDE 0.9% FLUSH: 9.0000 mL | INTRAVENOUS | Status: DC | PRN

## 2015-04-25 MED ORDER — DIPHENHYDRAMINE HCL 12.5 MG/5ML PO ELIX: 12.5000 mg | ORAL_SOLUTION | Freq: Four times a day (QID) | ORAL | Status: DC | PRN

## 2015-04-25 MED ORDER — SODIUM CHLORIDE 0.9 % IV SOLN
12.5000 mg | Freq: Four times a day (QID) | INTRAVENOUS | Status: DC | PRN
  Administered 2015-04-25: 12.5 mg via INTRAVENOUS
  Filled 2015-04-25 (×3): qty 0.25

## 2015-04-25 MED ORDER — KETOROLAC TROMETHAMINE 30 MG/ML IJ SOLN
30.0000 mg | Freq: Once | INTRAMUSCULAR | Status: AC
  Administered 2015-04-25: 30 mg via INTRAVENOUS
  Filled 2015-04-25: qty 1

## 2015-04-25 MED ORDER — OXYCODONE HCL 5 MG PO TABS
5.0000 mg | ORAL_TABLET | Freq: Once | ORAL | Status: AC
  Administered 2015-04-25: 5 mg via ORAL
  Filled 2015-04-25: qty 1

## 2015-04-25 MED ORDER — HEPARIN SOD (PORK) LOCK FLUSH 100 UNIT/ML IV SOLN
500.0000 [IU] | INTRAVENOUS | Status: AC | PRN
  Administered 2015-04-25: 500 [IU]

## 2015-04-25 MED ORDER — PROMETHAZINE HCL 25 MG PO TABS
12.5000 mg | ORAL_TABLET | Freq: Four times a day (QID) | ORAL | Status: DC | PRN
  Administered 2015-04-25: 12.5 mg via ORAL
  Filled 2015-04-25: qty 1

## 2015-04-25 NOTE — Progress Notes (Signed)
Patient with newly placed port from 02/15/207.  Steri strips in place.  Port accessed and flushes with ease.  Patient states she can taste the saline, no blood return noted after several flushes.  IV notified for access.

## 2015-04-25 NOTE — H&P (Signed)
[facility] History and Physical   Date: 04/25/2015  Patient name: Brittany Foster Medical record number: [id number] Date of birth: October 10, 1985 Age: 30 y.o. Gender: female PCP: Jeanann Lewandowsky, MD  Attending physician: Quentin Angst, MD  Chief Complaint: Generalized pain  History of Present Illness: Brittany Foster, a 30 year old female with a history of sickle cell anemia, HbSS presents complaining of generalized pain that is consistent with sickle cell anemia. She states that pain started yesterday and she attributes current crisis to changes in weather. Current pain intensity is 9/10, primarily to lower extremities and low back pain. She states that she last had MS Contin 30 mg and Percocet 10-325 mg this am with minimal relief. She endorses taking all other medications consistently. Brittany Foster denies headache, fatigue, chest pain, dysuria, nausea, vomiting, or diarrhea.  Meds: Prescriptions prior to admission  Medication Sig Dispense Refill Last Dose  . Cholecalciferol (VITAMIN D3) 5000 UNITS CAPS Take 1 capsule (5,000 Units total) by mouth daily. (Patient not taking: Reported on 03/07/2015) 30 capsule 5 Not Taking  . DULoxetine (CYMBALTA) 60 MG capsule Take 1 capsule (60 mg total) by mouth daily. 30 capsule 3 Taking  . folic acid (FOLVITE) 1 MG tablet Take 1 tablet (1 mg total) by mouth daily. 90 tablet 11 Taking  . gabapentin (NEURONTIN) 300 MG capsule Take 1 capsule (300 mg total) by mouth 3 (three) times daily. (Patient not taking: Reported on 03/26/2015) 90 capsule 2 Not Taking  . hydroxyurea (HYDREA) 500 MG capsule Take 2 capsules (1,000 mg total) by mouth daily. May take with food to minimize GI side effects. 60 capsule 3 Taking  . ibuprofen (ADVIL,MOTRIN) 200 MG tablet Take 200 mg by mouth every 6 (six) hours as needed for moderate pain.   Taking  . morphine (MS CONTIN) 30 MG 12 hr tablet Take 1 tablet (30 mg total) by mouth every 12 (twelve) hours. 30 tablet 0    . oxyCODONE-acetaminophen (PERCOCET) 10-325 MG tablet Take 1 tablet by mouth every 6 (six) hours as needed for pain. 60 tablet 0   . potassium chloride SA (K-DUR,KLOR-CON) 20 MEQ tablet Take 1 tablet (20 mEq total) by mouth daily. 30 tablet 2 Taking  . Topiramate ER 100 MG CP24 Take 100 mg by mouth at bedtime.   Taking    Allergies: Ultram; Zofran; Morphine and related; and Tape Past Medical History  Diagnosis Date  . Sickle cell anemia (HCC)   . Anemia    Past Surgical History  Procedure Laterality Date  . Tubal ligation    . Cholecystectomy    . Cesarean section    . Port a cath placement Right     about 6-7 years ago  . Cholecystectomy  2000   Family History  Problem Relation Age of Onset  . Sickle cell anemia Other   . Sickle cell trait Father   . Sickle cell trait Mother    Social History   Social History  . Marital Status: Single    Spouse Name: N/A  . Number of Children: N/A  . Years of Education: N/A   Occupational History  . Not on file.   Social History Main Topics  . Smoking status: Former Games developer  . Smokeless tobacco: Not on file     Comment: smokes once every couple of weeks.   . Alcohol Use: No  . Drug Use: No  . Sexual Activity: Yes    Birth Control/ Protection: Surgical  Other Topics Concern  . Not on file   Social History Narrative    Review of Systems: Constitutional: negative for anorexia, chills and fatigue Eyes: negative for visual disturbance Ears, nose, mouth, throat, and face: negative for nasal congestion Respiratory: negative for cough, dyspnea on exertion and wheezing Cardiovascular: negative for chest pain, dyspnea and palpitations Gastrointestinal: negative Genitourinary:negative Integument/breast: negative Hematologic/lymphatic: negative Musculoskeletal:positive for back pain and myalgias Neurological: positive for dizziness and weakness Behavioral/Psych: negative Endocrine: negative Allergic/Immunologic:  negative  Physical Exam: Last menstrual period 03/15/2015. LMP 03/15/2015 (Exact Date)  General Appearance:    Alert, cooperative,  Mild  distress, appears stated age  Head:    Normocephalic, without obvious abnormality, atraumatic  Eyes:    PERRL, conjunctiva/corneas clear, EOM's intact, fundi    benign, both eyes  Ears:    Normal TM's and external ear canals, both ears  Nose:   Nares normal, septum midline, mucosa normal, no drainage    or sinus tenderness  Throat:   Lips, mucosa, and tongue normal; teeth and gums normal.  Neck:   Supple, symmetrical, trachea midline, no adenopathy;    thyroid:  no enlargement/tenderness/nodules; no carotid   bruit or JVD  Back:     Symmetric, no curvature, ROM normal, no CVA tenderness  Lungs:     Clear to auscultation bilaterally, respirations unlabored  Chest Wall:    No tenderness or deformity   Heart:    Regular rate and rhythm, S1 and S2 normal, no murmur, rub   or gallop  Abdomen:     Soft, non-tender, bowel sounds active all four quadrants,    no masses, no organomegaly  Extremities:   Extremities normal, atraumatic, no cyanosis or edema  Pulses:   2+ and symmetric all extremities  Skin:   Skin color, texture, turgor normal, no rashes or lesions  Lymph nodes:   Cervical, supraclavicular, and axillary nodes normal  Neurologic:   CNII-XII intact, normal strength, sensation and reflexes    throughout    Lab results: No results found for this or any previous visit (from the past 24 hour(s)).  Imaging results:  No results found.   Assessment & Plan:  Patient will be admitted to the day infusion center for extended observation  Start IV D5.45 for cellular rehydration at 150/hr  Start Toradol 30 mg IV every 6 hours for inflammation.  Start Dilaudid PCA High Concentration per weight based protocol.   Patient will be re-evaluated for pain intensity in the context of function and relationship to      baseline as care progresses.  If  no significant pain relief, will transfer patient to inpatient services for a higher level of care.   Will check CMP, reticulocytes, and CBC w/differential  Brittany Foster 04/25/2015, 9:54 AM

## 2015-04-25 NOTE — Progress Notes (Addendum)
Patient admitted to the [facility] with 8/10 generalized pain. Treated with IV Toradol, PCA Dilaudid, IV Fluids and po oxycodone. Pain 6/10 at time of discharge. Went over discharge instructions with patient and patient voices understanding. Patient alert, oriented and ambulatory at discharge. Voided. Port a cath access declotted by IV team. Blood return noted. Port a cath flushed at discharge with 10 ml 0.9%NS and Heparin lock flush 100unit/mil...500unit. Port a cath site without redness or swelling or drainage. Discharged to home.

## 2015-04-25 NOTE — Discharge Summary (Signed)
[facility] Discharge Summary   Patient ID: Brittany Foster MRN: [id number] DOB/AGE: 09-13-1985 30 y.o.  Admit date: 04/25/2015 Discharge date: 04/25/2015  Primary Care Physician:  Jeanann Lewandowsky, MD  Admission Diagnoses:  Active Problems:   Sickle cell disease with crisis [facility])  Discharge Medications:    Medication List    ASK your doctor about these medications        DULoxetine 60 MG capsule  Commonly known as:  CYMBALTA  Take 1 capsule (60 mg total) by mouth daily.     folic acid 1 MG tablet  Commonly known as:  FOLVITE  Take 1 tablet (1 mg total) by mouth daily.     gabapentin 300 MG capsule  Commonly known as:  NEURONTIN  Take 1 capsule (300 mg total) by mouth 3 (three) times daily.     hydroxyurea 500 MG capsule  Commonly known as:  HYDREA  Take 2 capsules (1,000 mg total) by mouth daily. May take with food to minimize GI side effects.     ibuprofen 200 MG tablet  Commonly known as:  ADVIL,MOTRIN  Take 200 mg by mouth every 6 (six) hours as needed for moderate pain.     morphine 30 MG 12 hr tablet  Commonly known as:  MS CONTIN  Take 1 tablet (30 mg total) by mouth every 12 (twelve) hours.     oxyCODONE-acetaminophen 10-325 MG tablet  Commonly known as:  PERCOCET  Take 1 tablet by mouth every 6 (six) hours as needed for pain.     potassium chloride SA 20 MEQ tablet  Commonly known as:  K-DUR,KLOR-CON  Take 1 tablet (20 mEq total) by mouth daily.     Topiramate ER 100 MG Cp24  Take 100 mg by mouth at bedtime.     Vitamin D3 5000 units Caps  Take 1 capsule (5,000 Units total) by mouth daily.         Consults:  None  Significant Diagnostic Studies:  No results found.   [facility] Course: Brittany Foster was admitted to the day infusion center for generalized pain consistent with sickle cell anemia.  We were unable to get blood return from Port-A Cath, staff notified IV Team. IV Team was able to get blood  return.  Patient was continued on D5.45 @ 125 ml/hour for cellular rehydration Pain intensity was 9/10 on admission.  She was started on high concentration PCA dilaudid where she used 11.5 mg with 24 demands and 23 deliveries.  She was also given Percocet 10-325 mg per home dosage 45 minutes prior to discharge  She states that pain intensity is 7/10 Patient is alert, oriented, and ambulated. Will discharge home in stable condition.  Brittany Foster was instructed to follow up in office as scheduled on 05/07/2015.      Physical Exam at Discharge:   BP 106/71 mmHg  Pulse 83  Temp(Src) 98.2 F (36.8 C) (Oral)  Resp 14  SpO2 99%  LMP 04/11/2015  General Appearance:    Alert, cooperative, no distress, appears stated age  Head:    Normocephalic, without obvious abnormality, atraumatic  Back:     Symmetric, no curvature, ROM normal, no CVA tenderness  Lungs:     Clear to auscultation bilaterally, respirations unlabored  Chest Wall:    No tenderness or deformity   Heart:    Regular rate and rhythm, S1 and S2 normal, no murmur, rub   or gallop  Abdomen:  Soft, non-tender, bowel sounds active all four quadrants,    no masses, no organomegaly  Extremities:   Extremities normal, atraumatic, no cyanosis or edema  Pulses:   2+ and symmetric all extremities  Skin:   Skin color, texture, turgor normal, no rashes or lesions   Disposition at Discharge: 01-Home or Self Care  Discharge Orders:   Condition at Discharge:   Stable  Time spent on Discharge:  15 minutes  Signed: Maci Eickholt M 04/25/2015, 4:17 PM

## 2015-04-25 NOTE — Telephone Encounter (Signed)
Patient C/O pain to back and legs that is 7/10 on pain scale.  Patient denies chest pain or shortness of breath, patient denies vomiting and diarrhea, but does have some nausea.  Patient denies fever.  No improvement with home medications. Placed caller on hold and spoke with [name] [name]. Per [name], it is ok for patient to come to [facility].

## 2015-04-25 NOTE — Progress Notes (Addendum)
Unable to access to porta cath at this time. PIV started and patient receiving her Dilaudid PCA. Unable to draw labs, provider notified. Will cont to monitor patient.

## 2015-04-25 NOTE — Discharge Instructions (Signed)
Sickle Cell Anemia, Adult Sickle cell anemia is a condition in which red blood cells have an abnormal "sickle" shape. This abnormal shape shortens the cells' life span, which results in a lower than normal concentration of red blood cells in the blood. The sickle shape also causes the cells to clump together and block free blood flow through the blood vessels. As a result, the tissues and organs of the body do not receive enough oxygen. Sickle cell anemia causes organ damage and pain and increases the risk of infection. CAUSES  Sickle cell anemia is a genetic disorder. Those who receive two copies of the gene have the condition, and those who receive one copy have the trait. RISK FACTORS The sickle cell gene is most common in people whose families originated in [place]. Other areas of the globe where sickle cell trait occurs include the Mediterranean, [place] and [place], the [place], and the [place].  SIGNS AND SYMPTOMS  Pain, especially in the extremities, back, chest, or abdomen (common). The pain may start suddenly or may develop following an illness, especially if there is dehydration. Pain can also occur due to overexertion or exposure to extreme temperature changes.  Frequent severe bacterial infections, especially certain types of pneumonia and meningitis.  Pain and swelling in the hands and feet.  Decreased activity.   Loss of appetite.   Change in behavior.  Headaches.  Seizures.  Shortness of breath or difficulty breathing.  Vision changes.  Skin ulcers. Those with the trait may not have symptoms or they may have mild symptoms.  DIAGNOSIS  Sickle cell anemia is diagnosed with blood tests that demonstrate the genetic trait. It is often diagnosed during the newborn period, due to mandatory testing nationwide. A variety of blood tests, X-rays, CT scans, MRI scans, ultrasounds, and lung function tests may also be done to monitor the condition. TREATMENT  Sickle  cell anemia may be treated with:  Medicines. You may be given pain medicines, antibiotic medicines (to treat and prevent infections) or medicines to increase the production of certain types of hemoglobin.  Fluids.  Oxygen.  Blood transfusions. HOME CARE INSTRUCTIONS   Drink enough fluid to keep your urine clear or pale yellow. Increase your fluid intake in hot weather and during exercise.  Do not smoke. Smoking lowers oxygen levels in the blood.   Only take over-the-counter or prescription medicines for pain, fever, or discomfort as directed by your health care provider.  Take antibiotics as directed by your health care provider. Make sure you finish them it even if you start to feel better.   Take supplements as directed by your health care provider.   Consider wearing a medical alert bracelet. This tells anyone caring for you in an emergency of your condition.   When traveling, keep your medical information, health care provider's names, and the medicines you take with you at all times.   If you develop a fever, do not take medicines to reduce the fever right away. This could cover up a problem that is developing. Notify your health care provider.  Keep all follow-up appointments with your health care provider. Sickle cell anemia requires regular medical care. SEEK MEDICAL CARE IF: You have a fever. SEEK IMMEDIATE MEDICAL CARE IF:   You feel dizzy or faint.   You have new abdominal pain, especially on the left side near the stomach area.   You develop a persistent, often uncomfortable and painful penile erection (priapism). If this is not treated immediately it   will lead to impotence.   You have numbness your arms or legs or you have a hard time moving them.   You have a hard time with speech.   You have a fever or persistent symptoms for more than 2-3 days.   You have a fever and your symptoms suddenly get worse.   You have signs or symptoms of infection.  These include:   Chills.   Abnormal tiredness (lethargy).   Irritability.   Poor eating.   Vomiting.   You develop pain that is not helped with medicine.   You develop shortness of breath.  You have pain in your chest.   You are coughing up pus-like or bloody sputum.   You develop a stiff neck.  Your feet or hands swell or have pain.  Your abdomen appears bloated.  You develop joint pain. MAKE SURE YOU:  Understand these instructions.   This information is not intended to replace advice given to you by your health care provider. Make sure you discuss any questions you have with your health care provider.   Document Released: 05/27/2005 Document Revised: 03/09/2014 Document Reviewed: 09/28/2012 Elsevier Interactive Patient Education 2016 [employer]  

## 2015-04-29 ENCOUNTER — Telehealth (HOSPITAL_COMMUNITY): Payer: Self-pay | Admitting: *Deleted

## 2015-04-29 NOTE — Telephone Encounter (Signed)
Called patient back regarding sickle cell pain. Brittany Foster recommends patient continue her pain medications as prescribed and to increase her fluid intake. Patient verbalizes understanding.

## 2015-04-29 NOTE — Telephone Encounter (Signed)
Patient called to the [facility] complaining of bilateral leg, mid back and left rib cage pain. Rating 8/10. Denies fever, N/V, diarrhea and abdominal pain. Took oxycodone  and Morphine 30 mg po at 6 a without relief. Will notify provider and call patient back, patient aware.

## 2015-04-30 ENCOUNTER — Non-Acute Institutional Stay (HOSPITAL_COMMUNITY)
Admission: AD | Admit: 2015-04-30 | Discharge: 2015-04-30 | Disposition: A | Discharge status: Home / Self Care | Payer: Medicaid Other | Source: Ambulatory Visit | Attending: Internal Medicine | Admitting: Internal Medicine

## 2015-04-30 ENCOUNTER — Telehealth (HOSPITAL_COMMUNITY): Payer: Self-pay | Admitting: *Deleted

## 2015-04-30 ENCOUNTER — Encounter (HOSPITAL_COMMUNITY): Payer: Self-pay | Admitting: *Deleted

## 2015-04-30 DIAGNOSIS — D5700 Hb-SS disease with crisis, unspecified: Secondary | ICD-10-CM | POA: Insufficient documentation

## 2015-04-30 DIAGNOSIS — Z791 Long term (current) use of non-steroidal anti-inflammatories (NSAID): Secondary | ICD-10-CM | POA: Insufficient documentation

## 2015-04-30 DIAGNOSIS — Z79899 Other long term (current) drug therapy: Secondary | ICD-10-CM | POA: Diagnosis not present

## 2015-04-30 DIAGNOSIS — R52 Pain, unspecified: Secondary | ICD-10-CM | POA: Diagnosis present

## 2015-04-30 LAB — COMPREHENSIVE METABOLIC PANEL
ALBUMIN: 3.9 g/dL (ref 3.5–5.0)
ALT: 20 U/L (ref 14–54)
ANION GAP: 7 (ref 5–15)
AST: 31 U/L (ref 15–41)
Alkaline Phosphatase: 84 U/L (ref 38–126)
BILIRUBIN TOTAL: 2 mg/dL — AB (ref 0.3–1.2)
BUN: 7 mg/dL (ref 6–20)
CHLORIDE: 112 mmol/L — AB (ref 101–111)
CO2: 22 mmol/L (ref 22–32)
Calcium: 8.4 mg/dL — ABNORMAL LOW (ref 8.9–10.3)
Creatinine, Ser: 0.35 mg/dL — ABNORMAL LOW (ref 0.44–1.00)
GFR calc Af Amer: >60 mL/min (ref >60)
GFR calc non Af Amer: >60 mL/min (ref >60)
GLUCOSE: 123 mg/dL — AB (ref 65–99)
POTASSIUM: 3.3 mmol/L — AB (ref 3.5–5.1)
SODIUM: 141 mmol/L (ref 135–145)
Total Protein: 7.1 g/dL (ref 6.5–8.1)

## 2015-04-30 LAB — CBC WITH DIFFERENTIAL/PLATELET
BASOS ABS: 0 10*3/uL (ref 0.0–0.1)
BASOS PCT: 0 %
EOS PCT: 1 %
Eosinophils Absolute: 0.1 10*3/uL (ref 0.0–0.7)
HCT: 23.9 % — ABNORMAL LOW (ref 36.0–46.0)
Hemoglobin: 8.2 g/dL — ABNORMAL LOW (ref 12.0–15.0)
LYMPHS ABS: 3.4 10*3/uL (ref 0.7–4.0)
LYMPHS PCT: 28 %
MCH: 33.7 pg (ref 26.0–34.0)
MCHC: 34.3 g/dL (ref 30.0–36.0)
MCV: 98.4 fL (ref 78.0–100.0)
MONO ABS: 1 10*3/uL (ref 0.1–1.0)
MONOS PCT: 8 %
NRBC: 1 /100{WBCs} — AB
Neutro Abs: 7.8 10*3/uL — ABNORMAL HIGH (ref 1.7–7.7)
Neutrophils Relative %: 63 %
PLATELETS: 260 10*3/uL (ref 150–400)
RBC: 2.43 MIL/uL — AB (ref 3.87–5.11)
RDW: 22.3 % — AB (ref 11.5–15.5)
WBC: 12.3 10*3/uL — ABNORMAL HIGH (ref 4.0–10.5)

## 2015-04-30 MED ORDER — POLYETHYLENE GLYCOL 3350 17 G PO PACK: 17.0000 g | PACK | Freq: Every day | ORAL | Status: DC | PRN

## 2015-04-30 MED ORDER — FOLIC ACID 1 MG PO TABS
1.0000 mg | ORAL_TABLET | Freq: Every day | ORAL | Status: DC
  Administered 2015-04-30: 1 mg via ORAL
  Filled 2015-04-30: qty 1

## 2015-04-30 MED ORDER — ONDANSETRON HCL 4 MG/2ML IJ SOLN: 4.0000 mg | Freq: Four times a day (QID) | INTRAMUSCULAR | Status: DC | PRN

## 2015-04-30 MED ORDER — NALOXONE HCL 0.4 MG/ML IJ SOLN: 0.4000 mg | INTRAMUSCULAR | Status: DC | PRN

## 2015-04-30 MED ORDER — SODIUM CHLORIDE 0.9% FLUSH
10.0000 mL | INTRAVENOUS | Status: AC | PRN
  Administered 2015-04-30: 10 mL

## 2015-04-30 MED ORDER — DIPHENHYDRAMINE HCL 12.5 MG/5ML PO ELIX: 12.5000 mg | ORAL_SOLUTION | Freq: Four times a day (QID) | ORAL | Status: DC | PRN

## 2015-04-30 MED ORDER — SODIUM CHLORIDE 0.9 % IV SOLN
12.5000 mg | Freq: Four times a day (QID) | INTRAVENOUS | Status: DC | PRN
  Administered 2015-04-30: 12.5 mg via INTRAVENOUS
  Filled 2015-04-30 (×3): qty 0.25

## 2015-04-30 MED ORDER — HYDROMORPHONE 1 MG/ML IV SOLN
INTRAVENOUS | Status: DC
  Administered 2015-04-30: 18.1 mg via INTRAVENOUS
  Administered 2015-04-30: 11:00:00 via INTRAVENOUS
  Filled 2015-04-30: qty 25

## 2015-04-30 MED ORDER — HEPARIN SOD (PORK) LOCK FLUSH 100 UNIT/ML IV SOLN
500.0000 [IU] | INTRAVENOUS | Status: AC | PRN
  Administered 2015-04-30: 500 [IU]
  Filled 2015-04-30: qty 5

## 2015-04-30 MED ORDER — DEXTROSE-NACL 5-0.45 % IV SOLN
INTRAVENOUS | Status: DC
  Administered 2015-04-30: 11:00:00 via INTRAVENOUS

## 2015-04-30 MED ORDER — SENNOSIDES-DOCUSATE SODIUM 8.6-50 MG PO TABS: 1.0000 | ORAL_TABLET | Freq: Two times a day (BID) | ORAL | Status: DC

## 2015-04-30 MED ORDER — KETOROLAC TROMETHAMINE 30 MG/ML IJ SOLN
30.0000 mg | Freq: Four times a day (QID) | INTRAMUSCULAR | Status: DC
  Administered 2015-04-30: 30 mg via INTRAVENOUS
  Filled 2015-04-30: qty 1

## 2015-04-30 MED ORDER — SODIUM CHLORIDE 0.9% FLUSH: 9.0000 mL | INTRAVENOUS | Status: DC | PRN

## 2015-04-30 NOTE — Progress Notes (Signed)
[facility]. Pt stated her pain was 8/10 and pain was in her legs and back. Pt was treated with IV fluids, Dilaudid PCA, Toradol and Benadryl. Pt's pain was down to 6 at discharge. Pt voiced understanding of discharge instructions. Pt is alert, oriented and ambulatory at discharge.

## 2015-04-30 NOTE — Telephone Encounter (Signed)
Patient called back after speaking with the provider Dr.Jegede, that she could come in for treatment. Pt voiced understanding and that she could be here in 45 mins.

## 2015-04-30 NOTE — Telephone Encounter (Signed)
Patient called requesting to come [facility] for [facility]. States her pain is 8/10, in her legs and back. Took her last Morphine and Percocet at 6 am this morning. Will check with provider and call her back. Pt voiced understanding.

## 2015-04-30 NOTE — Discharge Summary (Signed)
Physician Discharge Summary  Brittany Foster RUE:[id number] DOB: 07-19-1985 DOA: 04/30/2015  PCP: Jeanann Lewandowsky, MD  Admit date: 04/30/2015  Discharge date: 04/30/2015  Time spent: 30 minutes  Discharge Diagnoses:  Active Problems:   Sickle cell anemia with pain [facility])   Discharge Condition: Stable  Diet recommendation: Regular  Filed Weights   04/30/15 0945  Weight: 122 lb (55.339 kg)    History of present illness:   HPI: Brittany Foster is a 30 y.o. female with history of sickle cell anemia, HbSS presents complaining of generalized pain that is consistent with sickle cell anemia. She states that pain started few days ago and she attributes current crisis to fluctuating weather. Current pain intensity is 10/10, primarily to lower extremities and low back pain. She states that she last had MS Contin 30 mg and Percocet 10-325 mg this am with minimal relief. She endorses taking all other medications consistently. She denies headache, fatigue, chest pain, dysuria, nausea, vomiting, or diarrhea.   Hospital Course:   Brittany Foster was admitted to the day hospital with sickle cell painful crisis. Patient was treated with weight based IV Dilaudid PCA, IV Toradol as well as IV fluids. Brittany Foster showed significant improvement symptomatically and pain went down from 10 to 5 out of 10 at the time of discharge. Brittany Foster will follow-up in the clinic as previously scheduled, continue with home medications as per prior to admission.  Discharge Exam: Filed Vitals:   04/30/15 1416 04/30/15 1508  BP: 100/65 101/61  Pulse: 94 94  Temp:    Resp: 17 15   General appearance: alert, cooperative and no distress Eyes: conjunctivae/corneas clear. PERRL, EOM's intact. Fundi benign. Neck: no adenopathy, no carotid bruit, no JVD, supple, symmetrical, trachea midline and thyroid not enlarged, symmetric, no tenderness/mass/nodules Back: symmetric, no curvature. ROM normal. No CVA tenderness. Resp: clear to  auscultation bilaterally Chest wall: no tenderness Cardio: regular rate and rhythm, S1, S2 normal, no murmur, click, rub or gallop GI: soft, non-tender; bowel sounds normal; no masses, no organomegaly Extremities: extremities normal, atraumatic, no cyanosis or edema Pulses: 2+ and symmetric Skin: Skin color, texture, turgor normal. No rashes or lesions Neurologic: Grossly normal  Discharge Instructions  We discussed the need for good hydration, monitoring of hydration status, avoidance of heat, cold, stress, and infection triggers. We discussed the need to be compliant with taking Hydrea. Brittany Foster was reminded of the need to seek medical attention of any symptoms of bleeding, anemia, or infection occurs.  Current Discharge Medication List    CONTINUE these medications which have NOT CHANGED   Details  DULoxetine (CYMBALTA) 60 MG capsule Take 1 capsule (60 mg total) by mouth daily. Qty: 30 capsule, Refills: 3   Associated Diagnoses: Chronic pain syndrome    folic acid (FOLVITE) 1 MG tablet Take 1 tablet (1 mg total) by mouth daily. Qty: 90 tablet, Refills: 11   Associated Diagnoses: Hb-SS disease without crisis (HCC)    hydroxyurea (HYDREA) 500 MG capsule Take 2 capsules (1,000 mg total) by mouth daily. May take with food to minimize GI side effects. 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    morphine (MS CONTIN) 30 MG 12 hr tablet Take 1 tablet (30 mg total) by mouth every 12 (twelve) hours. Qty: 30 tablet, Refills: 0   Associated Diagnoses: Hb-SS disease without crisis (HCC)  oxyCODONE-acetaminophen (PERCOCET) 10-325 MG tablet Take 1 tablet by mouth every 6 (six) hours as needed for pain. Qty: 60 tablet, Refills: 0   Associated Diagnoses: Hb-SS disease without crisis (HCC)    potassium chloride SA (K-DUR,KLOR-CON) 20  MEQ tablet Take 1 tablet (20 mEq total) by mouth daily. Qty: 30 tablet, Refills: 2   Associated Diagnoses: Hypokalemia    Topiramate ER 100 MG CP24 Take 100 mg by mouth at bedtime.    Cholecalciferol (VITAMIN D3) 5000 UNITS CAPS Take 1 capsule (5,000 Units total) by mouth daily. Qty: 30 capsule, Refills: 5   Associated Diagnoses: Vitamin D deficiency    gabapentin (NEURONTIN) 300 MG capsule Take 1 capsule (300 mg total) by mouth 3 (three) times daily. Qty: 90 capsule, Refills: 2       Allergies  Allergen Reactions  . Ultram [Tramadol] Other (See Comments)    seizures  . Zofran [Ondansetron Hcl] Nausea And Vomiting  . Morphine And Related Hives, Rash and Other (See Comments)    Shaking Tolerates Percocet, Norco, and buprenorphine  . Tape Rash     Significant Diagnostic Studies: No results found.  Signed:  Jeanann Lewandowsky MD, MHA, FACP, FAAP, CPE   04/30/2015, 5:19 PM

## 2015-04-30 NOTE — H&P (Signed)
[facility] History and Physical  Thomasena Vandenheuvel WJX:[id number] DOB: 1985/10/07 DOA: 04/30/2015  PCP: Jeanann Lewandowsky, MD   Chief Complaint: No chief complaint on file.  HPI: Brittany Foster is a 30 y.o. female with history of sickle cell anemia, HbSS presents complaining of generalized pain that is consistent with sickle cell anemia. She states that pain started few days ago and she attributes current crisis to fluctuating weather. Current pain intensity is 10/10, primarily to lower extremities and low back pain. She states that she last had MS Contin 30 mg and Percocet 10-325 mg this am with minimal relief. She endorses taking all other medications consistently. She denies headache, fatigue, chest pain, dysuria, nausea, vomiting, or diarrhea.   Systemic Review:  General: The patient denies anorexia, fever, weight loss Cardiac: Denies chest pain, syncope, palpitations, pedal edema  Respiratory: Denies cough, shortness of breath, wheezing GI: Denies severe indigestion/heartburn, abdominal pain, nausea, vomiting, diarrhea and constipation GU: Denies hematuria, incontinence, dysuria  Musculoskeletal: Denies arthritis  Skin: Denies suspicious skin lesions Neurologic: Denies focal weakness or numbness, change in vision  Past Medical History  Diagnosis Date  . Sickle cell anemia (HCC)   . Anemia     Past Surgical History  Procedure Laterality Date  . Tubal ligation    . Cholecystectomy    . Cesarean section    . Port a cath placement Right     about 6-7 years ago  . Cholecystectomy  2000    Allergies  Allergen Reactions  . Ultram [Tramadol] Other (See Comments)    seizures  . Zofran [Ondansetron Hcl] Nausea And Vomiting  . Morphine And Related Hives, Rash and Other (See Comments)    Shaking Tolerates Percocet, Norco, and buprenorphine  . Tape Rash    Family History  Problem Relation Age of Onset  . Sickle cell anemia Other   . Sickle cell trait Father   .  Sickle cell trait Mother       Prior to Admission medications   Medication Sig Start Date End Date Taking? Authorizing Provider  DULoxetine (CYMBALTA) 60 MG capsule Take 1 capsule (60 mg total) by mouth daily. 01/08/15  Yes Quentin Angst, MD  folic acid (FOLVITE) 1 MG tablet Take 1 tablet (1 mg total) by mouth daily. 01/08/15  Yes Quentin Angst, MD  hydroxyurea (HYDREA) 500 MG capsule Take 2 capsules (1,000 mg total) by mouth daily. May take with food to minimize GI side effects. 11/09/14  Yes [name], FNP  ibuprofen (ADVIL,MOTRIN) 200 MG tablet Take 200 mg by mouth every 6 (six) hours as needed for moderate pain.   Yes Historical Provider, MD  morphine (MS CONTIN) 30 MG 12 hr tablet Take 1 tablet (30 mg total) by mouth every 12 (twelve) hours. 04/23/15  Yes Quentin Angst, MD  oxyCODONE-acetaminophen (PERCOCET) 10-325 MG tablet Take 1 tablet by mouth every 6 (six) hours as needed for pain. 04/23/15  Yes Quentin Angst, MD  potassium chloride SA (K-DUR,KLOR-CON) 20 MEQ tablet Take 1 tablet (20 mEq total) by mouth daily. 01/08/15  Yes Quentin Angst, MD  Topiramate ER 100 MG CP24 Take 100 mg by mouth at bedtime.   Yes Historical Provider, MD  Cholecalciferol (VITAMIN D3) 5000 UNITS CAPS Take 1 capsule (5,000 Units total) by mouth daily. Patient not taking: Reported on 03/07/2015 01/08/15   Quentin Angst, MD  gabapentin (NEURONTIN) 300 MG capsule Take 1 capsule (300 mg total) by mouth 3 (three) times  daily. 02/14/14   Altha Harm, MD     Physical Exam: Filed Vitals:   04/30/15 0945  BP: 107/56  Pulse: 90  Temp: 98.5 F (36.9 C)  TempSrc: Oral  Resp: 16  Height: 5' (1.524 m)  Weight: 122 lb (55.339 kg)  SpO2: 100%    General: Alert, awake, afebrile, anicteric, not in obvious distress, poor dentition HEENT: Normocephalic and Atraumatic, Mucous membranes pink                PERRLA; EOM intact; No scleral icterus,                 Nares: Patent,  Oropharynx: Clear, Fair Dentition                 Neck: FROM, no cervical lymphadenopathy, thyromegaly, carotid bruit or JVD;  CHEST WALL: No tenderness  CHEST: Normal respiration, clear to auscultation bilaterally  HEART: Regular rate and rhythm; no murmurs rubs or gallops  BACK: No kyphosis or scoliosis; no CVA tenderness  ABDOMEN: Positive Bowel Sounds, soft, non-tender; no masses, no organomegaly Rectal Exam: deferred EXTREMITIES: No cyanosis, clubbing, or edema SKIN:  no rash or ulceration  CNS: Alert and Oriented x 4, Nonfocal exam, CN 2-12 intact  Labs on Admission:  Basic Metabolic Panel:  Recent Labs Lab 04/25/15 1508  NA 139  K 3.0*  CL 108  CO2 25  GLUCOSE 150*  BUN 7  CREATININE 0.40*  CALCIUM 8.6*   Liver Function Tests:  Recent Labs Lab 04/25/15 1508  AST 33  ALT 25  ALKPHOS 90  BILITOT 1.8*  PROT 7.4  ALBUMIN 4.0   No results for input(s): LIPASE, AMYLASE in the last 168 hours. No results for input(s): AMMONIA in the last 168 hours. CBC:  Recent Labs Lab 04/25/15 1508  WBC 14.6*  NEUTROABS 9.4*  HGB 7.9*  HCT 23.6*  MCV 97.5  PLT 312   Cardiac Enzymes: No results for input(s): CKTOTAL, CKMB, CKMBINDEX, TROPONINI in the last 168 hours.  BNP (last 3 results) No results for input(s): BNP in the last 8760 hours.  ProBNP (last 3 results) No results for input(s): PROBNP in the last 8760 hours.  CBG: No results for input(s): GLUCAP in the last 168 hours.   Assessment/Plan Active Problems:   Sickle cell anemia with pain (HCC)   Ad[facility] to the Day [facility]  IVF D5 .45% Saline @ 125 mls/hour  Weight based Dilaudid PCA started within 30 minutes of admission  IV Toradol Q 6 H  Monitor vitals very closely, Re-evaluate pain scale every hour  Oxygen by [place]  Patient will be re-evaluated for pain in the context of function and relationship to baseline as care progresses.  If no significant relieve from pain (remains above 5/10) will  transfer patient to inpatient services for further evaluation and management  Code Status: Full  Family Communication: None  DVT Prophylaxis: Ambulate as tolerated   Time spent: 35 Minutes  Brittany Viera, MD, MHA, FACP, FAAP, CPE  If 7PM-7AM, please contact night-coverage [email or web address] 04/30/2015, 10:14 AM

## 2015-05-02 ENCOUNTER — Telehealth (HOSPITAL_COMMUNITY): Payer: Self-pay | Admitting: *Deleted

## 2015-05-02 NOTE — Telephone Encounter (Signed)
Medical Provider advises for patient to hydrate, take pain medication as prescribed and seek medical attention in the ED if pain continues. Patient advised and understands.

## 2015-05-02 NOTE — Telephone Encounter (Signed)
Complaining of stomach, back and bilateral leg pain. 8/10. Denies fever, chest pain, N/V or diarrhea. Took po Morphine and oxycodone at 5 am without relief. Let patient know that I will make provider aware and give her a call back.

## 2015-05-03 ENCOUNTER — Telehealth (HOSPITAL_COMMUNITY): Payer: Self-pay | Admitting: *Deleted

## 2015-05-03 ENCOUNTER — Non-Acute Institutional Stay (HOSPITAL_COMMUNITY)
Admission: AD | Admit: 2015-05-03 | Discharge: 2015-05-03 | Disposition: A | Discharge status: Home / Self Care | Payer: Medicaid Other | Source: Ambulatory Visit | Attending: Internal Medicine | Admitting: Internal Medicine

## 2015-05-03 ENCOUNTER — Encounter (HOSPITAL_COMMUNITY): Payer: Self-pay | Admitting: *Deleted

## 2015-05-03 DIAGNOSIS — D5700 Hb-SS disease with crisis, unspecified: Secondary | ICD-10-CM | POA: Diagnosis not present

## 2015-05-03 DIAGNOSIS — Z79899 Other long term (current) drug therapy: Secondary | ICD-10-CM | POA: Diagnosis not present

## 2015-05-03 DIAGNOSIS — Z87891 Personal history of nicotine dependence: Secondary | ICD-10-CM | POA: Diagnosis not present

## 2015-05-03 DIAGNOSIS — G8929 Other chronic pain: Secondary | ICD-10-CM | POA: Diagnosis not present

## 2015-05-03 DIAGNOSIS — M79604 Pain in right leg: Secondary | ICD-10-CM | POA: Diagnosis not present

## 2015-05-03 DIAGNOSIS — D571 Sickle-cell disease without crisis: Secondary | ICD-10-CM | POA: Diagnosis not present

## 2015-05-03 DIAGNOSIS — Z9049 Acquired absence of other specified parts of digestive tract: Secondary | ICD-10-CM | POA: Insufficient documentation

## 2015-05-03 DIAGNOSIS — Z79891 Long term (current) use of opiate analgesic: Secondary | ICD-10-CM | POA: Diagnosis not present

## 2015-05-03 DIAGNOSIS — M545 Low back pain: Secondary | ICD-10-CM | POA: Diagnosis not present

## 2015-05-03 DIAGNOSIS — M79605 Pain in left leg: Secondary | ICD-10-CM | POA: Diagnosis not present

## 2015-05-03 MED ORDER — OXYCODONE-ACETAMINOPHEN 5-325 MG PO TABS
1.0000 | ORAL_TABLET | Freq: Once | ORAL | Status: AC
Start: 2015-05-03 — End: 2015-05-03
  Administered 2015-05-03: 1 via ORAL
  Filled 2015-05-03: qty 1

## 2015-05-03 MED ORDER — HYDROMORPHONE 1 MG/ML IV SOLN
INTRAVENOUS | Status: DC
  Administered 2015-05-03: 14:00:00 via INTRAVENOUS
  Administered 2015-05-03: 5.5 mg via INTRAVENOUS
  Filled 2015-05-03: qty 25

## 2015-05-03 MED ORDER — PROMETHAZINE HCL 25 MG PO TABS: 12.5000 mg | ORAL_TABLET | Freq: Four times a day (QID) | ORAL | Status: DC | PRN

## 2015-05-03 MED ORDER — DIPHENHYDRAMINE HCL 50 MG/ML IJ SOLN
12.5000 mg | Freq: Four times a day (QID) | INTRAMUSCULAR | Status: DC | PRN
  Administered 2015-05-03: 12.5 mg via INTRAVENOUS
  Filled 2015-05-03 (×3): qty 0.25

## 2015-05-03 MED ORDER — HYDROMORPHONE HCL 2 MG/ML IJ SOLN
2.0000 mg | Freq: Once | INTRAMUSCULAR | Status: AC
  Administered 2015-05-03: 2 mg via SUBCUTANEOUS
  Filled 2015-05-03: qty 1

## 2015-05-03 MED ORDER — NALOXONE HCL 0.4 MG/ML IJ SOLN: 0.4000 mg | INTRAMUSCULAR | Status: DC | PRN

## 2015-05-03 MED ORDER — HEPARIN SOD (PORK) LOCK FLUSH 100 UNIT/ML IV SOLN
500.0000 [IU] | INTRAVENOUS | Status: AC | PRN
  Administered 2015-05-03: 500 [IU]
  Filled 2015-05-03: qty 5

## 2015-05-03 MED ORDER — ALTEPLASE 2 MG IJ SOLR
2.0000 mg | Freq: Once | INTRAMUSCULAR | Status: AC
  Administered 2015-05-03: 2 mg
  Filled 2015-05-03: qty 2

## 2015-05-03 MED ORDER — DIPHENHYDRAMINE HCL 12.5 MG/5ML PO ELIX: 12.5000 mg | ORAL_SOLUTION | Freq: Four times a day (QID) | ORAL | Status: DC | PRN

## 2015-05-03 MED ORDER — HYDROMORPHONE HCL 2 MG/ML IJ SOLN: 2.0000 mg | Freq: Once | INTRAMUSCULAR | Status: DC

## 2015-05-03 MED ORDER — SODIUM CHLORIDE 0.9% FLUSH: 9.0000 mL | INTRAVENOUS | Status: DC | PRN

## 2015-05-03 MED ORDER — OXYCODONE HCL 5 MG PO TABS
5.0000 mg | ORAL_TABLET | Freq: Once | ORAL | Status: AC
Start: 2015-05-03 — End: 2015-05-03
  Administered 2015-05-03: 5 mg via ORAL
  Filled 2015-05-03: qty 1

## 2015-05-03 MED ORDER — DEXTROSE-NACL 5-0.45 % IV SOLN
INTRAVENOUS | Status: DC
  Administered 2015-05-03: 14:00:00 via INTRAVENOUS

## 2015-05-03 MED ORDER — KETOROLAC TROMETHAMINE 30 MG/ML IJ SOLN
30.0000 mg | Freq: Once | INTRAMUSCULAR | Status: AC
  Administered 2015-05-03: 30 mg via INTRAVENOUS
  Filled 2015-05-03: qty 1

## 2015-05-03 MED ORDER — SODIUM CHLORIDE 0.9% FLUSH
10.0000 mL | INTRAVENOUS | Status: AC | PRN
  Administered 2015-05-03: 10 mL

## 2015-05-03 NOTE — Telephone Encounter (Signed)
[facility] called [facility]. Pt states pain is in her legs, back and stomach. She took her last pain med at 730 this morning and has cont to take her meds as prescribed. Will check with the provider and give her a call back. Pt voiced understanding.

## 2015-05-03 NOTE — Discharge Summary (Signed)
[facility] Discharge Summary   Patient ID: Brittany Foster MRN: [id number] DOB/AGE: 31/03/1985 31 y.o.  Admit date: 05/03/2015 Discharge date: 05/03/2015  Primary Care Physician:  Jeanann LewandowskyJEGEDE, OLUGBEMIGA, MD  Admission Diagnoses:  Active Problems:   Sickle cell anemia with pain [facility](HCC)  Discharge Medications:    Medication List    ASK your doctor about these medications        DULoxetine 60 MG capsule  Commonly known as:  CYMBALTA  Take 1 capsule (60 mg total) by mouth daily.     folic acid 1 MG tablet  Commonly known as:  FOLVITE  Take 1 tablet (1 mg total) by mouth daily.     gabapentin 300 MG capsule  Commonly known as:  NEURONTIN  Take 1 capsule (300 mg total) by mouth 3 (three) times daily.     hydroxyurea 500 MG capsule  Commonly known as:  HYDREA  Take 2 capsules (1,000 mg total) by mouth daily. May take with food to minimize GI side effects.     ibuprofen 200 MG tablet  Commonly known as:  ADVIL,MOTRIN  Take 200 mg by mouth every 6 (six) hours as needed for moderate pain.     morphine 30 MG 12 hr tablet  Commonly known as:  MS CONTIN  Take 1 tablet (30 mg total) by mouth every 12 (twelve) hours.     oxyCODONE-acetaminophen 10-325 MG tablet  Commonly known as:  PERCOCET  Take 1 tablet by mouth every 6 (six) hours as needed for pain.     potassium chloride SA 20 MEQ tablet  Commonly known as:  K-DUR,KLOR-CON  Take 1 tablet (20 mEq total) by mouth daily.     Topiramate ER 100 MG Cp24  Take 100 mg by mouth at bedtime.     Vitamin D3 5000 units Caps  Take 1 capsule (5,000 Units total) by mouth daily.         Consults:  None  Significant Diagnostic Studies:  No results found.   [facility] Course: [name] was admitted to the day infusion center for generalized pain consistent with sickle cell anemia.  We were unable to get blood return from Port-A Cath, staff notified IV Team. IV Team was able to get blood return  after TPA for 90 minutes. Patient recently [place] port [place]  Patient was continued on D5.45 @ 150 ml/hour for cellular rehydration Pain intensity was 10/10 on admission.  She was started on high concentration PCA dilaudid where she used 5.5 mg with 12 demands and 11 deliveries.  She was also given Percocet 10-325 mg per home dosage 45 minutes prior to discharge  She states that pain intensity is 9/10 Patient is alert, oriented, and ambulated. Will discharge home in stable condition.  [name] was instructed to follow up in office as scheduled on 05/07/2015.      Physical Exam at Discharge:   BP 101/65 mmHg  Pulse 84  Temp(Src) 98.3 F (36.8 C) (Oral)  Resp 13  Ht 5' (1.524 m)  Wt 123 lb (55.792 kg)  BMI 24.02 kg/m2  SpO2 99%  LMP 04/11/2015  General Appearance:    Alert, cooperative, no distress, appears stated age  Head:    Normocephalic, without obvious abnormality, atraumatic  Back:     Symmetric, no curvature, ROM normal, no CVA tenderness  Lungs:     Clear to auscultation bilaterally, respirations unlabored  Chest Wall:  No tenderness or deformity   Heart:    Regular rate and rhythm, S1 and S2 normal, no murmur, rub   or gallop  Abdomen:     Soft, non-tender, bowel sounds active all four quadrants,    no masses, no organomegaly  Extremities:   Extremities normal, atraumatic, no cyanosis or edema  Pulses:   2+ and symmetric all extremities  Skin:   Skin color, texture, turgor normal, no rashes or lesions   Disposition at Discharge: 01-Home or Self Care  Discharge Orders:   Condition at Discharge:   Stable  Time spent on Discharge:  15 minutes  Signed: Veldon Wager M 05/03/2015, 4:07 PM

## 2015-05-03 NOTE — H&P (Signed)
[facility] History and Physical   Date: 05/03/2015  Patient name: Brittany Foster Medical record number: [id number] Date of birth: October 17, 1985 Age: 30 y.o. Gender: female PCP: Jeanann Lewandowsky, MD  Attending physician: Quentin Angst, MD  Chief Complaint: Generalized pain  History of Present Illness: [name], a 30 year old female with a history of sickle cell anemia, HbSS presents complaining of generalized pain that is consistent with sickle cell anemia. Patient is currently being treated for chronic pain. She states that pain started several days and she attributes current crisis to changes in weather. She was last admitted for extended observation on 04/30/2015. Current pain intensity is 10/10, primarily to lower extremities and low back pain. She states that she last had MS Contin 30 mg and Percocet 10-325 mg this am with minimal relief. She endorses taking all other medications consistently. Ms. Bridwell denies headache, fatigue, chest pain, dysuria, nausea, vomiting, or diarrhea.  Meds: Prescriptions prior to admission  Medication Sig Dispense Refill Last Dose  . Cholecalciferol (VITAMIN D3) 5000 UNITS CAPS Take 1 capsule (5,000 Units total) by mouth daily. (Patient not taking: Reported on 03/07/2015) 30 capsule 5 Not Taking  . DULoxetine (CYMBALTA) 60 MG capsule Take 1 capsule (60 mg total) by mouth daily. 30 capsule 3 Past Week at Unknown time  . folic acid (FOLVITE) 1 MG tablet Take 1 tablet (1 mg total) by mouth daily. 90 tablet 11 Past Week at Unknown time  . gabapentin (NEURONTIN) 300 MG capsule Take 1 capsule (300 mg total) by mouth 3 (three) times daily. 90 capsule 2 Past Week at Unknown time  . hydroxyurea (HYDREA) 500 MG capsule Take 2 capsules (1,000 mg total) by mouth daily. May take with food to minimize GI side effects. 60 capsule 3 04/29/2015 at Unknown time  . ibuprofen (ADVIL,MOTRIN) 200 MG tablet Take 200 mg by mouth every 6 (six) hours as needed  for moderate pain.   Past Month at Unknown time  . morphine (MS CONTIN) 30 MG 12 hr tablet Take 1 tablet (30 mg total) by mouth every 12 (twelve) hours. 30 tablet 0 04/30/2015 at Unknown time  . oxyCODONE-acetaminophen (PERCOCET) 10-325 MG tablet Take 1 tablet by mouth every 6 (six) hours as needed for pain. 60 tablet 0 04/30/2015 at Unknown time  . potassium chloride SA (K-DUR,KLOR-CON) 20 MEQ tablet Take 1 tablet (20 mEq total) by mouth daily. 30 tablet 2 Past Week at Unknown time  . Topiramate ER 100 MG CP24 Take 100 mg by mouth at bedtime.   04/30/2015 at Unknown time    Allergies: Ultram; Zofran; Morphine and related; and Tape Past Medical History  Diagnosis Date  . Sickle cell anemia (HCC)   . Anemia    Past Surgical History  Procedure Laterality Date  . Tubal ligation    . Cholecystectomy    . Cesarean section    . Port a cath placement Right     about 6-7 years ago  . Cholecystectomy  2000   Family History  Problem Relation Age of Onset  . Sickle cell anemia Other   . Sickle cell trait Father   . Sickle cell trait Mother    Social History   Social History  . Marital Status: Single    Spouse Name: N/A  . Number of Children: N/A  . Years of Education: N/A   Occupational History  . Not on file.   Social History Main Topics  . Smoking status: Former  Smoker  . Smokeless tobacco: Not on file     Comment: smokes once every couple of weeks.   . Alcohol Use: No  . Drug Use: No  . Sexual Activity: Yes    Birth Control/ Protection: Surgical   Other Topics Concern  . Not on file   Social History Narrative    Review of Systems: Constitutional: negative for anorexia, chills and fatigue Eyes: negative for visual disturbance Ears, nose, mouth, throat, and face: negative for nasal congestion Respiratory: negative for cough, dyspnea on exertion and wheezing Cardiovascular: negative for chest pain, dyspnea and palpitations Gastrointestinal:  negative Genitourinary:negative Integument/breast: negative Hematologic/lymphatic: negative Musculoskeletal:positive for back pain and myalgias Neurological: positive for dizziness and weakness Behavioral/Psych: negative Endocrine: negative Allergic/Immunologic: negative  Physical Exam: Last menstrual period 04/11/2015. LMP 04/11/2015  General Appearance:    Alert, cooperative,  Moderate distress, appears stated age  Head:    Normocephalic, without obvious abnormality, atraumatic  Eyes:    PERRL, conjunctiva/corneas clear, EOM's intact, fundi    benign, both eyes  Ears:    Normal TM's and external ear canals, both ears  Nose:   Nares normal, septum midline, mucosa normal, no drainage    or sinus tenderness  Throat:   Lips, mucosa, and tongue normal; teeth and gums normal.  Neck:   Supple, symmetrical, trachea midline, no adenopathy;    thyroid:  no enlargement/tenderness/nodules; no carotid   bruit or JVD  Back:     Symmetric, no curvature, ROM normal, no CVA tenderness  Lungs:     Clear to auscultation bilaterally, respirations unlabored  Chest Wall:    No tenderness or deformity   Heart:    Regular rate and rhythm, S1 and S2 normal, no murmur, rub   or gallop  Abdomen:     Soft, non-tender, bowel sounds active all four quadrants,    no masses, no organomegaly  Extremities:   Extremities normal, atraumatic, no cyanosis or edema  Pulses:   2+ and symmetric all extremities  Skin:   Skin color, texture, turgor normal, no rashes or lesions  Lymph nodes:   Cervical, supraclavicular, and axillary nodes normal  Neurologic:   CNII-XII intact, normal strength, sensation and reflexes    throughout    Lab results: No results found for this or any previous visit (from the past 24 hour(s)).  Imaging results:  No results found.   Assessment & Plan:  Patient will be admitted to the day infusion center for extended observation  Start IV D5.45 for cellular rehydration at  150/hr  Start Toradol 30 mg IV every 6 hours for inflammation.  Start Dilaudid PCA High Concentration per weight based protocol.   Patient will be re-evaluated for pain intensity in the context of function and relationship to baseline as care           progresses.  If no significant pain relief, will transfer patient to inpatient services for a higher level of care.   Reviewed labs from 04/30/2015, consistent with baseline  Randal Goens M 05/03/2015, 10:32 AM

## 2015-05-03 NOTE — Telephone Encounter (Signed)
Pt called after speaking with the provider C. Hart RochesterHollis, NP that she can come in for treatment. Pt voiced understanding and that she can be here in 40 minutes.

## 2015-05-03 NOTE — Progress Notes (Signed)
[facility]. Pt stated pain was 10/10 on admission. Pt was first treated with SQ Dilaudid and later IV fluids, Dilaudid PCA, Toradol and Benadryl. Pt tolerated treatment well. Voiced understanding of discharge instructions. Pt was alert, oriented and ambulatory at discharge.

## 2015-05-03 NOTE — Progress Notes (Signed)
Unable to get blood return from porta cath, per IV nurse. Porta cath TPA'd per protocol. Pt given pain med per SQ injection. Will cont to monitor.

## 2015-05-03 NOTE — Progress Notes (Signed)
Port accessed.  Flushes without difficulty.  No blood return noted.  IV team consult placed.  NP Armeniahina [name] notified.  RN will continue to monitor patient.

## 2015-05-07 ENCOUNTER — Ambulatory Visit: Payer: Medicaid Other | Admitting: Internal Medicine

## 2015-05-17 ENCOUNTER — Telehealth: Payer: Self-pay

## 2015-05-17 NOTE — Telephone Encounter (Signed)
Pt called and requested medication refill on Oxycodone 10mg , and MS Contin. Thanks!

## 2015-05-20 ENCOUNTER — Non-Acute Institutional Stay (HOSPITAL_COMMUNITY)
Admission: AD | Admit: 2015-05-20 | Discharge: 2015-05-20 | Disposition: A | Discharge status: Home / Self Care | Payer: Medicaid Other | Source: Ambulatory Visit | Attending: Internal Medicine | Admitting: Internal Medicine

## 2015-05-20 ENCOUNTER — Encounter (HOSPITAL_COMMUNITY): Payer: Self-pay

## 2015-05-20 ENCOUNTER — Telehealth (HOSPITAL_COMMUNITY): Payer: Self-pay | Admitting: Internal Medicine

## 2015-05-20 DIAGNOSIS — Z79899 Other long term (current) drug therapy: Secondary | ICD-10-CM | POA: Diagnosis not present

## 2015-05-20 DIAGNOSIS — Z79891 Long term (current) use of opiate analgesic: Secondary | ICD-10-CM | POA: Diagnosis not present

## 2015-05-20 DIAGNOSIS — D5700 Hb-SS disease with crisis, unspecified: Secondary | ICD-10-CM

## 2015-05-20 DIAGNOSIS — Z9049 Acquired absence of other specified parts of digestive tract: Secondary | ICD-10-CM | POA: Diagnosis not present

## 2015-05-20 DIAGNOSIS — D571 Sickle-cell disease without crisis: Secondary | ICD-10-CM | POA: Diagnosis not present

## 2015-05-20 DIAGNOSIS — Z87891 Personal history of nicotine dependence: Secondary | ICD-10-CM | POA: Insufficient documentation

## 2015-05-20 MED ORDER — OXYCODONE HCL 5 MG PO TABS
5.0000 mg | ORAL_TABLET | Freq: Once | ORAL | Status: AC
  Administered 2015-05-20: 5 mg via ORAL
  Filled 2015-05-20: qty 1

## 2015-05-20 MED ORDER — HYDROMORPHONE HCL 2 MG/ML IJ SOLN
2.0000 mg | Freq: Once | INTRAMUSCULAR | Status: AC
  Administered 2015-05-20: 2 mg via SUBCUTANEOUS
  Filled 2015-05-20: qty 1

## 2015-05-20 MED ORDER — OXYCODONE-ACETAMINOPHEN 5-325 MG PO TABS
1.0000 | ORAL_TABLET | Freq: Once | ORAL | Status: AC
Start: 2015-05-20 — End: 2015-05-20
  Administered 2015-05-20: 1 via ORAL
  Filled 2015-05-20: qty 1

## 2015-05-20 MED ORDER — DIPHENHYDRAMINE HCL 12.5 MG/5ML PO ELIX: 12.5000 mg | ORAL_SOLUTION | Freq: Four times a day (QID) | ORAL | Status: DC | PRN

## 2015-05-20 MED ORDER — KETOROLAC TROMETHAMINE 30 MG/ML IJ SOLN
30.0000 mg | Freq: Once | INTRAMUSCULAR | Status: AC
  Administered 2015-05-20: 30 mg via INTRAVENOUS
  Filled 2015-05-20 (×2): qty 1

## 2015-05-20 MED ORDER — NALOXONE HCL 0.4 MG/ML IJ SOLN: 0.4000 mg | INTRAMUSCULAR | Status: DC | PRN

## 2015-05-20 MED ORDER — HEPARIN SOD (PORK) LOCK FLUSH 100 UNIT/ML IV SOLN: 500.0000 [IU] | INTRAVENOUS | Status: DC | PRN

## 2015-05-20 MED ORDER — DEXTROSE-NACL 5-0.45 % IV SOLN
INTRAVENOUS | Status: DC
  Administered 2015-05-20: 14:00:00 via INTRAVENOUS

## 2015-05-20 MED ORDER — HYDROMORPHONE 1 MG/ML IV SOLN
INTRAVENOUS | Status: DC
  Administered 2015-05-20: 8 mg via INTRAVENOUS
  Administered 2015-05-20: 14:00:00 via INTRAVENOUS
  Filled 2015-05-20: qty 25

## 2015-05-20 MED ORDER — SODIUM CHLORIDE 0.9% FLUSH: 9.0000 mL | INTRAVENOUS | Status: DC | PRN

## 2015-05-20 MED ORDER — SODIUM CHLORIDE 0.9 % IV SOLN
12.5000 mg | Freq: Four times a day (QID) | INTRAVENOUS | Status: DC | PRN
  Administered 2015-05-20: 12.5 mg via INTRAVENOUS
  Filled 2015-05-20 (×3): qty 0.25

## 2015-05-20 MED ORDER — SODIUM CHLORIDE 0.9% FLUSH: 10.0000 mL | INTRAVENOUS | Status: DC | PRN

## 2015-05-20 NOTE — Progress Notes (Signed)
Accessed patient's port a cath twice per protocol; no blood return noted with each attempt; NP notified; IV attempt initiated; pt alert and oriented; new orders received for pain medication by alternate route; will continue to monitor

## 2015-05-20 NOTE — Discharge Summary (Signed)
[facility] Discharge Summary   Patient ID: Brittany Foster MRN: [id number] DOB/AGE: 31/03/1985 31 y.o.  Admit date: 05/20/2015 Discharge date: 05/20/2015  Primary Care Physician:  Jeanann LewandowskyJEGEDE, OLUGBEMIGA, MD  Admission Diagnoses:  Active Problems:   Sickle cell anemia with pain [facility](HCC)  Discharge Medications:    Medication List    ASK your doctor about these medications        DULoxetine 60 MG capsule  Commonly known as:  CYMBALTA  Take 1 capsule (60 mg total) by mouth daily.     folic acid 1 MG tablet  Commonly known as:  FOLVITE  Take 1 tablet (1 mg total) by mouth daily.     gabapentin 300 MG capsule  Commonly known as:  NEURONTIN  Take 1 capsule (300 mg total) by mouth 3 (three) times daily.     hydroxyurea 500 MG capsule  Commonly known as:  HYDREA  Take 2 capsules (1,000 mg total) by mouth daily. May take with food to minimize GI side effects.     ibuprofen 200 MG tablet  Commonly known as:  ADVIL,MOTRIN  Take 200 mg by mouth every 6 (six) hours as needed for moderate pain.     morphine 30 MG 12 hr tablet  Commonly known as:  MS CONTIN  Take 1 tablet (30 mg total) by mouth every 12 (twelve) hours.     oxyCODONE-acetaminophen 10-325 MG tablet  Commonly known as:  PERCOCET  Take 1 tablet by mouth every 6 (six) hours as needed for pain.     potassium chloride SA 20 MEQ tablet  Commonly known as:  K-DUR,KLOR-CON  Take 1 tablet (20 mEq total) by mouth daily.     Topiramate ER 100 MG Cp24  Take 100 mg by mouth at bedtime.     Vitamin D3 5000 units Caps  Take 1 capsule (5,000 Units total) by mouth daily.         Consults:  None  Significant Diagnostic Studies:  No results found.   [facility] Course: [name] was admitted to the day infusion center for generalized pain consistent with sickle cell anemia.  We were unable to get blood return from Port-A Cath, staff notified IV Team. IV Team was able to get a  peripheral IV started on patient. Patient recently had port [place]  Patient was continued on D5.45 @ 125 ml/hour for cellular rehydration Pain intensity was 10/10 on admission.   She was started on high concentration PCA dilaudid where she used  8 mg with 16 demands and 16 deliveries.  She was also given Percocet 10-325 mg per home dosage 45 minutes prior to discharge  She states that pain intensity is 7/10 Patient is alert, oriented, and ambulated. Will discharge home in stable condition.  [name] was instructed to follow up in office as scheduled previously     Physical Exam at Discharge:   BP 104/65 mmHg  Pulse 93  Temp(Src) 98.5 F (36.9 C) (Oral)  Resp 14  SpO2 100%  LMP 05/14/2015  General Appearance:    Alert, cooperative,  No distress, appears stated age  Head:    Normocephalic, without obvious abnormality, atraumatic  Back:     Symmetric, no curvature, ROM normal, no CVA tenderness  Lungs:     Clear to auscultation bilaterally, respirations unlabored  Chest Wall:    No tenderness or deformity   Heart:    Regular rate and rhythm,  S1 and S2 normal, no murmur, rub   or gallop  Abdomen:     Soft, non-tender, bowel sounds active all four quadrants,    no masses, no organomegaly  Extremities:   Extremities normal, atraumatic, no cyanosis or edema  Pulses:   2+ and symmetric all extremities  Skin:   Skin color, texture, turgor normal, no rashes or lesions   Disposition at Discharge: 01-Home or Self Care  Discharge Orders:   Condition at Discharge:   Stable  Time spent on Discharge:  15 minutes  Signed: Giovonnie Trettel M 05/20/2015, 4:45 PM

## 2015-05-20 NOTE — Progress Notes (Signed)
[facility]. Pt stated her pain was 8/10 and down 7/10 at discharged. Pt was treated with IV fluids, PCA Dilaudid, Toradol and SQ Dilaudid. Pt verbalized understanding of discharge instructions and was discharged to home, alert, oriented and ambulatory.

## 2015-05-20 NOTE — H&P (Signed)
[facility] History and Physical   Date: 05/20/2015  Patient name: Brittany Foster Medical record number: [id number] Date of birth: 1985-05-10 Age: 30 y.o. Gender: female PCP: Jeanann Lewandowsky, MD  Attending physician: Quentin Angst, MD  Chief Complaint: Generalized pain  History of Present Illness: Ms. Bonne Whack, a 30 year old female with a history of sickle cell anemia, HbSS presents complaining of generalized pain that is consistent with sickle cell anemia. Patient is currently being treated for chronic pain. She states that pain started several days and she attributes current crisis to changes in weather. She was last admitted for extended observation on 05/03/2015. Current pain intensity is 10/10, primarily to lower extremities and low back. She states that she last had MS Contin 30 mg and Percocet 10-325 mg this am with minimal relief. She endorses taking all other medications consistently. Ms. Plotner denies headache, fatigue, chest pain, dysuria, nausea, vomiting, or diarrhea.  Meds: Prescriptions prior to admission  Medication Sig Dispense Refill Last Dose  . DULoxetine (CYMBALTA) 60 MG capsule Take 1 capsule (60 mg total) by mouth daily. 30 capsule 3 05/19/2015 at Unknown time  . folic acid (FOLVITE) 1 MG tablet Take 1 tablet (1 mg total) by mouth daily. 90 tablet 11 05/19/2015 at Unknown time  . hydroxyurea (HYDREA) 500 MG capsule Take 2 capsules (1,000 mg total) by mouth daily. May take with food to minimize GI side effects. 60 capsule 3 05/19/2015 at Unknown time  . morphine (MS CONTIN) 30 MG 12 hr tablet Take 1 tablet (30 mg total) by mouth every 12 (twelve) hours. 30 tablet 0 05/20/2015 at Unknown time  . oxyCODONE-acetaminophen (PERCOCET) 10-325 MG tablet Take 1 tablet by mouth every 6 (six) hours as needed for pain. 60 tablet 0 05/20/2015 at Unknown time  . potassium chloride SA (K-DUR,KLOR-CON) 20 MEQ tablet Take 1 tablet (20 mEq total) by mouth daily. 30  tablet 2 05/19/2015 at Unknown time  . Topiramate ER 100 MG CP24 Take 100 mg by mouth at bedtime.   05/19/2015 at Unknown time  . Cholecalciferol (VITAMIN D3) 5000 UNITS CAPS Take 1 capsule (5,000 Units total) by mouth daily. (Patient not taking: Reported on 03/07/2015) 30 capsule 5 Unknown at Unknown time  . gabapentin (NEURONTIN) 300 MG capsule Take 1 capsule (300 mg total) by mouth 3 (three) times daily. 90 capsule 2 Unknown at Unknown time  . ibuprofen (ADVIL,MOTRIN) 200 MG tablet Take 200 mg by mouth every 6 (six) hours as needed for moderate pain.   Unknown at Unknown time    Allergies: Ultram; Zofran; Morphine and related; and Tape Past Medical History  Diagnosis Date  . Sickle cell anemia (HCC)   . Anemia    Past Surgical History  Procedure Laterality Date  . Tubal ligation    . Cholecystectomy    . Cesarean section    . Port a cath placement Right     about 6-7 years ago  . Cholecystectomy  2000   Family History  Problem Relation Age of Onset  . Sickle cell anemia Other   . Sickle cell trait Father   . Sickle cell trait Mother    Social History   Social History  . Marital Status: Single    Spouse Name: N/A  . Number of Children: N/A  . Years of Education: N/A   Occupational History  . Not on file.   Social History Main Topics  . Smoking status: Former Games developer  .  Smokeless tobacco: Not on file     Comment: smokes once every couple of weeks.   . Alcohol Use: No  . Drug Use: No  . Sexual Activity: Yes    Birth Control/ Protection: Surgical   Other Topics Concern  . Not on file   Social History Narrative    Review of Systems: Constitutional: negative for anorexia, chills and fatigue Eyes: negative for visual disturbance Ears, nose, mouth, throat, and face: negative for nasal congestion Respiratory: negative for cough, dyspnea on exertion and wheezing Cardiovascular: negative for chest pain, dyspnea and palpitations Gastrointestinal:  negative Genitourinary:negative Integument/breast: negative Hematologic/lymphatic: negative Musculoskeletal:positive for back pain and myalgias Neurological: positive for dizziness and weakness Behavioral/Psych: negative Endocrine: negative Allergic/Immunologic: negative  Physical Exam: Blood pressure 108/77, pulse 94, temperature 98.5 F (36.9 C), temperature source Oral, resp. rate 18, last menstrual period 05/14/2015, SpO2 100 %. BP 108/77 mmHg  Pulse 94  Temp(Src) 98.5 F (36.9 C) (Oral)  Resp 18  SpO2 100%  LMP 05/14/2015  General Appearance:    Alert, cooperative,  Moderate distress, appears stated age  Head:    Normocephalic, without obvious abnormality, atraumatic  Eyes:    PERRL, conjunctiva/corneas clear, EOM's intact, fundi    benign, both eyes  Ears:    Normal TM's and external ear canals, both ears  Nose:   Nares normal, septum midline, mucosa normal, no drainage    or sinus tenderness  Throat:   Lips, mucosa, and tongue normal; teeth and gums normal.  Neck:   Supple, symmetrical, trachea midline, no adenopathy;    thyroid:  no enlargement/tenderness/nodules; no carotid   bruit or JVD  Back:     Symmetric, no curvature, ROM normal, no CVA tenderness  Lungs:     Clear to auscultation bilaterally, respirations unlabored  Chest Wall:    No tenderness or deformity   Heart:    Regular rate and rhythm, S1 and S2 normal, no murmur, rub   or gallop  Abdomen:     Soft, non-tender, bowel sounds active all four quadrants,    no masses, no organomegaly  Extremities:   Extremities normal, atraumatic, no cyanosis or edema  Pulses:   2+ and symmetric all extremities  Skin:   Skin color, texture, turgor normal, no rashes or lesions  Lymph nodes:   Cervical, supraclavicular, and axillary nodes normal  Neurologic:   CNII-XII intact, normal strength, sensation and reflexes    throughout    Lab results: No results found for this or any previous visit (from the past 24  hour(s)).  Imaging results:  No results found.   Assessment & Plan:  Patient will be admitted to the day infusion center for extended observation  Start IV D5.45 for cellular rehydration at 125/hr  Start Toradol 30 mg IV every 6 hours for inflammation.  Start Dilaudid PCA High Concentration per weight based protocol.   Patient will be re-evaluated for pain intensity in the context of function and relationship to             baseline as care  progresses.  If no significant pain relief, will transfer patient to inpatient services for a higher level of care.   Will review CBC w/diff, reticulocyte count, and CMP  Jeson Camacho M 05/20/2015, 11:40 AM

## 2015-05-20 NOTE — Telephone Encounter (Signed)
Refill request for Oxycodone 10mg , and MS contin. LOV 03/26/2015. Please advise. Thanks!

## 2015-05-20 NOTE — Telephone Encounter (Signed)
Pt called and states experiencing pain in legs and back; rates pain 8/10; pt states taken home pain medications with no relief; pt denies chest pain, shortness of breath, nausea, vomiting, or diarrhea; NP notified

## 2015-05-20 NOTE — Telephone Encounter (Signed)
Notified patient that, per NP, she may come to day clinic for evaluation; pt verbalizes understanding

## 2015-05-21 ENCOUNTER — Other Ambulatory Visit: Payer: Self-pay | Admitting: Internal Medicine

## 2015-05-21 DIAGNOSIS — D571 Sickle-cell disease without crisis: Secondary | ICD-10-CM

## 2015-05-21 MED ORDER — OXYCODONE-ACETAMINOPHEN 10-325 MG PO TABS: 1.0000 | ORAL_TABLET | Freq: Four times a day (QID) | ORAL | Status: DC | PRN

## 2015-05-21 MED ORDER — MORPHINE SULFATE ER 30 MG PO TBCR: 30.0000 mg | EXTENDED_RELEASE_TABLET | Freq: Two times a day (BID) | ORAL | Status: DC

## 2015-05-24 ENCOUNTER — Telehealth (HOSPITAL_COMMUNITY): Payer: Self-pay | Admitting: *Deleted

## 2015-05-24 NOTE — Telephone Encounter (Signed)
[facility] complaining of pain in legs and back and wants to come to the day hospital. Rates pain 8/10. Denies fever, chest pain, N/V, diarrhea and abdominal pain. Took Morphine 30 mg and oxycodone 10 mg at 6:48 am. Advised patient that I will relay message to the medical provider and call her back.

## 2015-05-24 NOTE — Telephone Encounter (Signed)
Returned call to patient and advised her that the information from L. [name] NP was to continue taking medications and hydrate well. Patient verbalized understanding.

## 2015-05-27 ENCOUNTER — Telehealth (HOSPITAL_COMMUNITY): Payer: Self-pay | Admitting: *Deleted

## 2015-05-27 ENCOUNTER — Non-Acute Institutional Stay (HOSPITAL_COMMUNITY)
Admission: AD | Admit: 2015-05-27 | Discharge: 2015-05-27 | Disposition: A | Discharge status: Home / Self Care | Payer: Medicaid Other | Source: Ambulatory Visit | Attending: Internal Medicine | Admitting: Internal Medicine

## 2015-05-27 ENCOUNTER — Encounter (HOSPITAL_COMMUNITY): Payer: Self-pay | Admitting: *Deleted

## 2015-05-27 DIAGNOSIS — R52 Pain, unspecified: Secondary | ICD-10-CM | POA: Diagnosis present

## 2015-05-27 DIAGNOSIS — Z79899 Other long term (current) drug therapy: Secondary | ICD-10-CM | POA: Insufficient documentation

## 2015-05-27 DIAGNOSIS — Z79891 Long term (current) use of opiate analgesic: Secondary | ICD-10-CM | POA: Diagnosis not present

## 2015-05-27 DIAGNOSIS — Z791 Long term (current) use of non-steroidal anti-inflammatories (NSAID): Secondary | ICD-10-CM | POA: Insufficient documentation

## 2015-05-27 DIAGNOSIS — D5700 Hb-SS disease with crisis, unspecified: Secondary | ICD-10-CM | POA: Diagnosis present

## 2015-05-27 DIAGNOSIS — Z87891 Personal history of nicotine dependence: Secondary | ICD-10-CM | POA: Insufficient documentation

## 2015-05-27 MED ORDER — ONDANSETRON HCL 4 MG/2ML IJ SOLN: 4.0000 mg | Freq: Four times a day (QID) | INTRAMUSCULAR | Status: DC | PRN

## 2015-05-27 MED ORDER — HYDROMORPHONE 1 MG/ML IV SOLN
INTRAVENOUS | Status: DC
Start: 2015-05-27 — End: 2015-05-27

## 2015-05-27 MED ORDER — HYDROMORPHONE HCL 2 MG/ML IJ SOLN: 2.0000 mg | Freq: Once | INTRAMUSCULAR | Status: DC

## 2015-05-27 MED ORDER — HYDROMORPHONE HCL 2 MG/ML IJ SOLN
2.0000 mg | Freq: Once | INTRAMUSCULAR | Status: AC
  Administered 2015-05-27: 2 mg via SUBCUTANEOUS
  Filled 2015-05-27: qty 1

## 2015-05-27 MED ORDER — KETOROLAC TROMETHAMINE 30 MG/ML IJ SOLN: 30.0000 mg | Freq: Once | INTRAMUSCULAR | Status: DC

## 2015-05-27 MED ORDER — OXYCODONE-ACETAMINOPHEN 5-325 MG PO TABS
1.0000 | ORAL_TABLET | Freq: Once | ORAL | Status: AC
  Administered 2015-05-27: 1 via ORAL
  Filled 2015-05-27: qty 1

## 2015-05-27 MED ORDER — DIPHENHYDRAMINE HCL 12.5 MG/5ML PO ELIX: 12.5000 mg | ORAL_SOLUTION | Freq: Four times a day (QID) | ORAL | Status: DC | PRN

## 2015-05-27 MED ORDER — SODIUM CHLORIDE 0.9% FLUSH
10.0000 mL | INTRAVENOUS | Status: AC | PRN
  Administered 2015-05-27: 10 mL

## 2015-05-27 MED ORDER — NALOXONE HCL 0.4 MG/ML IJ SOLN: 0.4000 mg | INTRAMUSCULAR | Status: DC | PRN

## 2015-05-27 MED ORDER — SODIUM CHLORIDE 0.9% FLUSH: 9.0000 mL | INTRAVENOUS | Status: DC | PRN

## 2015-05-27 MED ORDER — HEPARIN SOD (PORK) LOCK FLUSH 100 UNIT/ML IV SOLN
500.0000 [IU] | INTRAVENOUS | Status: AC | PRN
  Administered 2015-05-27: 500 [IU]
  Filled 2015-05-27: qty 5

## 2015-05-27 MED ORDER — DEXTROSE-NACL 5-0.45 % IV SOLN: INTRAVENOUS | Status: DC

## 2015-05-27 MED ORDER — SODIUM CHLORIDE 0.9 % IV SOLN
12.5000 mg | Freq: Four times a day (QID) | INTRAVENOUS | Status: DC | PRN
  Filled 2015-05-27: qty 0.25

## 2015-05-27 MED ORDER — OXYCODONE HCL 5 MG PO TABS
5.0000 mg | ORAL_TABLET | Freq: Once | ORAL | Status: AC
  Administered 2015-05-27: 5 mg via ORAL
  Filled 2015-05-27: qty 1

## 2015-05-27 NOTE — Progress Notes (Signed)
IV team unable to get IV start IV and still unable to get blood return from port. Two doses of Dilaudid SQ administered.

## 2015-05-27 NOTE — Telephone Encounter (Signed)
Notified patient to come [facility] for treatment per L. [name] NP. Patient is finding a ride and will come in.

## 2015-05-27 NOTE — Progress Notes (Signed)
[facility] for complaints of pain in legs and back. 8/10 rating. Denied chest pain, fever, N/V, Diarrhea and abdominal pain. Treated with SQ Dilaudid and po oxycodone due to inability to get blood return from port a cath in right chest, even though forward flushes freely. Also unable to establish venous access. Patient understands to return tomorrow morning [facility] for [facility]. Alert, oriented and ambulatory at discharge.

## 2015-05-27 NOTE — Progress Notes (Signed)
Patient didn't want port a cath accessed this time due to the non blood return on last two visits. After 3 attempts for venous access in arms, patient agrees for access of port a cath. IV team consulted for start.

## 2015-05-27 NOTE — H&P (Signed)
[facility] History and Physical   Date: 05/27/2015  Patient name: Brittany Foster Medical record number: [id number] Date of birth: 02-17-1986 Age: 30 y.o. Gender: female PCP: Jeanann Lewandowsky, MD  Attending physician: Quentin Angst, MD  Chief Complaint: Generalized pain  History of Present Illness: [name] Pain, a 30 year old female with a history of sickle cell anemia, HbSS presents complaining of generalize pain. She states that pain intensity has been increasing throughout the weekend and has not been controlled by home medications. She last had Percocet 10-325 mg and MS Contin around 5 am with minimal relief. She states that she is taking all other medications consistently. She denies headache, chest pain, shortness of breath, nausea, vomiting, dysuria, or diarrhea.   Meds: Prescriptions prior to admission  Medication Sig Dispense Refill Last Dose  . Cholecalciferol (VITAMIN D3) 5000 UNITS CAPS Take 1 capsule (5,000 Units total) by mouth daily. (Patient not taking: Reported on 03/07/2015) 30 capsule 5 Unknown at Unknown time  . DULoxetine (CYMBALTA) 60 MG capsule Take 1 capsule (60 mg total) by mouth daily. 30 capsule 3 05/19/2015 at Unknown time  . folic acid (FOLVITE) 1 MG tablet Take 1 tablet (1 mg total) by mouth daily. 90 tablet 11 05/19/2015 at Unknown time  . gabapentin (NEURONTIN) 300 MG capsule Take 1 capsule (300 mg total) by mouth 3 (three) times daily. 90 capsule 2 Unknown at Unknown time  . hydroxyurea (HYDREA) 500 MG capsule Take 2 capsules (1,000 mg total) by mouth daily. May take with food to minimize GI side effects. 60 capsule 3 05/19/2015 at Unknown time  . ibuprofen (ADVIL,MOTRIN) 200 MG tablet Take 200 mg by mouth every 6 (six) hours as needed for moderate pain.   Unknown at Unknown time  . morphine (MS CONTIN) 30 MG 12 hr tablet Take 1 tablet (30 mg total) by mouth every 12 (twelve) hours. 30 tablet 0   . oxyCODONE-acetaminophen (PERCOCET)  10-325 MG tablet Take 1 tablet by mouth every 6 (six) hours as needed for pain. 60 tablet 0   . potassium chloride SA (K-DUR,KLOR-CON) 20 MEQ tablet Take 1 tablet (20 mEq total) by mouth daily. 30 tablet 2 05/19/2015 at Unknown time  . Topiramate ER 100 MG CP24 Take 100 mg by mouth at bedtime.   05/19/2015 at Unknown time    Allergies: Ultram; Zofran; Morphine and related; and Tape Past Medical History  Diagnosis Date  . Sickle cell anemia (HCC)   . Anemia    Past Surgical History  Procedure Laterality Date  . Tubal ligation    . Cholecystectomy    . Cesarean section    . Port a cath placement Right     about 6-7 years ago  . Cholecystectomy  2000   Family History  Problem Relation Age of Onset  . Sickle cell anemia Other   . Sickle cell trait Father   . Sickle cell trait Mother    Social History   Social History  . Marital Status: Single    Spouse Name: N/A  . Number of Children: N/A  . Years of Education: N/A   Occupational History  . Not on file.   Social History Main Topics  . Smoking status: Former Games developer  . Smokeless tobacco: Not on file     Comment: smokes once every couple of weeks.   . Alcohol Use: No  . Drug Use: No  . Sexual Activity: Yes    Birth Control/ Protection: Surgical  Other Topics Concern  . Not on file   Social History Narrative    Review of Systems: Constitutional: positive for fatigue Eyes: negative Ears, nose, mouth, throat, and face: negative Respiratory: negative for dyspnea on exertion, sputum and wheezing Cardiovascular: negative for chest pain, dyspnea, irregular heart beat and palpitations Gastrointestinal: negative Genitourinary:negative Hematologic/lymphatic: negative Musculoskeletal:positive for back pain and myalgias Neurological: negative Endocrine: negative  Physical Exam: Last menstrual period 05/14/2015. BP 120/78 mmHg  Pulse 84  Temp(Src) 98.8 F (37.1 C)  Resp 20  Ht 5' (1.524 m)  Wt 120 lb (54.432 kg)   BMI 23.44 kg/m2  SpO2 100%  LMP 05/14/2015  General Appearance:    Alert, cooperative,  mild distress, appears stated age  Head:    Normocephalic, without obvious abnormality, atraumatic  Eyes:    PERRL, conjunctiva/corneas clear, EOM's intact, fundi    benign, both eyes  Ears:    Normal TM's and external ear canals, both ears  Nose:   Nares normal, septum midline, mucosa normal, no drainage    or sinus tenderness  Throat:   Lips, mucosa, and tongue normal; teeth and gums normal  Neck:   Supple, symmetrical, trachea midline, no adenopathy;    thyroid:  no enlargement/tenderness/nodules; no carotid   bruit or JVD  Back:     Symmetric, no curvature, ROM normal, no CVA tenderness  Lungs:     Clear to auscultation bilaterally, respirations unlabored  Chest Wall:    No tenderness or deformity   Heart:    Regular rate and rhythm, S1 and S2 normal, no murmur, rub   or gallop  Abdomen:     Soft, non-tender, bowel sounds active all four quadrants,    no masses, no organomegaly  Extremities:   Extremities normal, atraumatic, no cyanosis or edema  Pulses:   2+ and symmetric all extremities  Skin:   Skin color, texture, turgor normal, no rashes or lesions  Lymph nodes:   Cervical, supraclavicular, and axillary nodes normal  Neurologic:   CNII-XII intact, normal strength, sensation and reflexes    throughout    Lab results: No results found for this or any previous visit (from the past 24 hour(s)).  Imaging results:  No results found.   Assessment & Plan:  Patient will be admitted to the day infusion center for extended observation  Start IV D5.45 for cellular rehydration at 125/hr  Start Toradol 30 mg IV every 6 hours for inflammation.  Start Dilaudid PCA High Concentration per weight based protocol.   Patient will be re-evaluated for pain intensity in the context of function and relationship to baseline as care progresses.  If no significant pain relief, will transfer patient to  inpatient services for a higher level of care.   Will check CMP, reticulocytes,  LDH and CBC w/differential  Buster Schueller M 05/27/2015, 12:24 PM

## 2015-05-27 NOTE — Discharge Summary (Signed)
[facility] Discharge Summary   Patient ID: Brittany Foster Topper MRN: [id number] DOB/AGE: 31/03/1985 31 y.o.  Admit date: 05/27/2015 Discharge date: 05/27/2015  Primary Care Physician:  Jeanann LewandowskyJEGEDE, OLUGBEMIGA, MD  Admission Diagnoses:  Active Problems:   Hb-SS disease with crisis [facility](HCC)  Discharge Medications:    Medication List    ASK your doctor about these medications        DULoxetine 60 MG capsule  Commonly known as:  CYMBALTA  Take 1 capsule (60 mg total) by mouth daily.     folic acid 1 MG tablet  Commonly known as:  FOLVITE  Take 1 tablet (1 mg total) by mouth daily.     gabapentin 300 MG capsule  Commonly known as:  NEURONTIN  Take 1 capsule (300 mg total) by mouth 3 (three) times daily.     hydroxyurea 500 MG capsule  Commonly known as:  HYDREA  Take 2 capsules (1,000 mg total) by mouth daily. May take with food to minimize GI side effects.     ibuprofen 200 MG tablet  Commonly known as:  ADVIL,MOTRIN  Take 200 mg by mouth every 6 (six) hours as needed for moderate pain.     morphine 30 MG 12 hr tablet  Commonly known as:  MS CONTIN  Take 1 tablet (30 mg total) by mouth every 12 (twelve) hours.     oxyCODONE-acetaminophen 10-325 MG tablet  Commonly known as:  PERCOCET  Take 1 tablet by mouth every 6 (six) hours as needed for pain.     potassium chloride SA 20 MEQ tablet  Commonly known as:  K-DUR,KLOR-CON  Take 1 tablet (20 mEq total) by mouth daily.     Topiramate ER 100 MG Cp24  Take 100 mg by mouth at bedtime.     Vitamin D3 5000 units Caps  Take 1 capsule (5,000 Units total) by mouth daily.         Consults:  None  Significant Diagnostic Studies:  No results found.   [facility] Course: Patient was admitted for extended observation. Patient has poor venous access. We were not able to access patient's port or start a peripheral IV.  Current pain intensity is 8/10. She was given Dilaudid subcutaneously for pain  control. Patient was also given Percocet 10-325 mg 40 minutes prior to discharge. [name] was discharged home in stable condition.  Recommend that patient schedule an appointment with primary provider in order to address removing port a cath.    The patient was given clear instructions to go to ER or return to medical center if symptoms do not improve, worsen or new problems develop. The patient verbalized understanding. Physical Exam at Discharge:   BP 120/78 mmHg  Pulse 84  Temp(Src) 98.8 F (37.1 C)  Resp 20  Ht 5' (1.524 m)  Wt 120 lb (54.432 kg)  BMI 23.44 kg/m2  SpO2 100%  LMP 05/14/2015  General Appearance:    Alert, cooperative, no distress, appears stated age  Head:    Normocephalic, without obvious abnormality, atraumatic  Throat:   Lips, mucosa, and tongue normal; teeth and gums normal  Neck:   Supple, symmetrical, trachea midline, no adenopathy;    thyroid:  no enlargement/tenderness/nodules; no carotid   bruit or JVD  Back:     Symmetric, no curvature, ROM normal, no CVA tenderness  Lungs:     Clear to auscultation bilaterally, respirations unlabored  Chest Wall:    No tenderness or deformity   Heart:  Regular rate and rhythm, S1 and S2 normal, no murmur, rub   or gallop   Disposition at Discharge: 01-Home or Self Care  Discharge Orders:   Condition at Discharge:   Stable  Time spent on Discharge:  15 minutes  Signed: Irja Wheless M 05/27/2015, 3:11 PM

## 2015-05-27 NOTE — Telephone Encounter (Signed)
Complaining of bilateral leg and back pain rating 8/10. Denies fever, chest pain, N/V, diarrhea or abdominal pain. Took Morphine 30 mg and oxycodone 10 mg at 5 am without relief. Will let medical provider know and call patient back, patient aware.

## 2015-05-28 ENCOUNTER — Encounter (HOSPITAL_COMMUNITY): Payer: Self-pay | Admitting: *Deleted

## 2015-05-28 ENCOUNTER — Other Ambulatory Visit: Payer: Self-pay | Admitting: Family Medicine

## 2015-05-28 ENCOUNTER — Non-Acute Institutional Stay (HOSPITAL_COMMUNITY)
Admission: AD | Admit: 2015-05-28 | Discharge: 2015-05-28 | Disposition: A | Discharge status: Home / Self Care | Payer: Medicaid Other | Source: Ambulatory Visit | Attending: Internal Medicine | Admitting: Internal Medicine

## 2015-05-28 DIAGNOSIS — Z87891 Personal history of nicotine dependence: Secondary | ICD-10-CM | POA: Insufficient documentation

## 2015-05-28 DIAGNOSIS — Z791 Long term (current) use of non-steroidal anti-inflammatories (NSAID): Secondary | ICD-10-CM | POA: Insufficient documentation

## 2015-05-28 DIAGNOSIS — R52 Pain, unspecified: Secondary | ICD-10-CM | POA: Diagnosis present

## 2015-05-28 DIAGNOSIS — D5700 Hb-SS disease with crisis, unspecified: Secondary | ICD-10-CM | POA: Insufficient documentation

## 2015-05-28 DIAGNOSIS — Z79899 Other long term (current) drug therapy: Secondary | ICD-10-CM | POA: Diagnosis not present

## 2015-05-28 DIAGNOSIS — Z79891 Long term (current) use of opiate analgesic: Secondary | ICD-10-CM | POA: Diagnosis not present

## 2015-05-28 MED ORDER — SODIUM CHLORIDE 0.9% FLUSH
10.0000 mL | INTRAVENOUS | Status: AC | PRN
  Administered 2015-05-28: 10 mL

## 2015-05-28 MED ORDER — HYDROMORPHONE HCL 2 MG/ML IJ SOLN: 2.0000 mg | Freq: Once | INTRAMUSCULAR | Status: DC

## 2015-05-28 MED ORDER — HYDROMORPHONE HCL 2 MG/ML IJ SOLN
2.0000 mg | Freq: Once | INTRAMUSCULAR | Status: AC
  Administered 2015-05-28: 2 mg via SUBCUTANEOUS
  Filled 2015-05-28: qty 1

## 2015-05-28 MED ORDER — ALTEPLASE 2 MG IJ SOLR
2.0000 mg | Freq: Once | INTRAMUSCULAR | Status: AC
  Administered 2015-05-28: 2 mg
  Filled 2015-05-28: qty 2

## 2015-05-28 MED ORDER — HEPARIN SOD (PORK) LOCK FLUSH 100 UNIT/ML IV SOLN
500.0000 [IU] | INTRAVENOUS | Status: AC | PRN
Start: 2015-05-28 — End: 2015-05-28
  Administered 2015-05-28: 500 [IU]
  Filled 2015-05-28: qty 5

## 2015-05-28 MED ORDER — KETOROLAC TROMETHAMINE 30 MG/ML IJ SOLN
30.0000 mg | Freq: Once | INTRAMUSCULAR | Status: AC
  Administered 2015-05-28: 30 mg via INTRAMUSCULAR
  Filled 2015-05-28: qty 1

## 2015-05-28 MED ORDER — DEXTROSE-NACL 5-0.45 % IV SOLN: INTRAVENOUS | Status: DC

## 2015-05-28 MED ORDER — KETOROLAC TROMETHAMINE 30 MG/ML IJ SOLN: 30.0000 mg | Freq: Once | INTRAMUSCULAR | Status: DC

## 2015-05-28 NOTE — H&P (Signed)
[facility] History and Physical   Date: 05/28/2015  Patient name: Brittany Foster Medical record number: [id number] Date of birth: [birth year] Age: 30 y.o. Gender: female PCP: Jeanann Lewandowsky, MD  Attending physician: Quentin Angst, MD  Chief Complaint: Generalized Foster  History of Present Illness: [name], a 30 year old female with a history of sickle cell anemia, HbSS presents complaining of generalize Foster. Patient was seen in the day infusion center on 05/27/2015. She has poor venous access and staff was unable to obtain blood return from port-a cath. Patient received Dilaudid subcutaneously with minimal relief. She states that Foster intensity had been increasing throughout the weekend and has not been controlled by home medications. She attributes current Foster crisis to changes in weather.  She last had Percocet 10-325 mg and MS Contin around 6 am with minimal relief. She states that she is taking all other medications consistently. She denies headache, chest Foster, shortness of breath, nausea, vomiting, dysuria, or diarrhea.   Meds: Prescriptions prior to admission  Medication Sig Dispense Refill Last Dose  . DULoxetine (CYMBALTA) 60 MG capsule Take 1 capsule (60 mg total) by mouth daily. 30 capsule 3 05/28/2015 at Unknown time  . folic acid (FOLVITE) 1 MG tablet Take 1 tablet (1 mg total) by mouth daily. 90 tablet 11 05/27/2015 at Unknown time  . hydroxyurea (HYDREA) 500 MG capsule Take 2 capsules (1,000 mg total) by mouth daily. May take with food to minimize GI side effects. 60 capsule 3 05/28/2015 at Unknown time  . ibuprofen (ADVIL,MOTRIN) 200 MG tablet Take 200 mg by mouth every 6 (six) hours as needed for moderate Foster.   05/27/2015 at Unknown time  . morphine (MS CONTIN) 30 MG 12 hr tablet Take 1 tablet (30 mg total) by mouth every 12 (twelve) hours. 30 tablet 0 05/28/2015 at Unknown time  . oxyCODONE-acetaminophen (PERCOCET) 10-325 MG tablet Take 1  tablet by mouth every 6 (six) hours as needed for Foster. 60 tablet 0 05/28/2015 at Unknown time  . potassium chloride SA (K-DUR,KLOR-CON) 20 MEQ tablet Take 1 tablet (20 mEq total) by mouth daily. 30 tablet 2 05/27/2015 at Unknown time  . Topiramate ER 100 MG CP24 Take 100 mg by mouth at bedtime.   05/27/2015 at Unknown time  . Cholecalciferol (VITAMIN D3) 5000 UNITS CAPS Take 1 capsule (5,000 Units total) by mouth daily. (Patient not taking: Reported on 03/07/2015) 30 capsule 5 Unknown at Unknown time  . gabapentin (NEURONTIN) 300 MG capsule Take 1 capsule (300 mg total) by mouth 3 (three) times daily. 90 capsule 2 Unknown at Unknown time    Allergies: Ultram; Zofran; Morphine and related; and Tape Past Medical History  Diagnosis Date  . Sickle cell anemia (HCC)   . Anemia    Past Surgical History  Procedure Laterality Date  . Tubal ligation    . Cholecystectomy    . Cesarean section    . Port a cath placement Right     about 6-7 years ago  . Cholecystectomy  2000   Family History  Problem Relation Age of Onset  . Sickle cell anemia Other   . Sickle cell trait Father   . Sickle cell trait Mother    Social History   Social History  . Marital Status: Single    Spouse Name: N/A  . Number of Children: N/A  . Years of Education: N/A   Occupational History  . Not on file.   Social History  Main Topics  . Smoking status: Former Games developermoker  . Smokeless tobacco: Not on file     Comment: smokes once every couple of weeks.   . Alcohol Use: No  . Drug Use: No  . Sexual Activity: Yes    Birth Control/ Protection: Surgical   Other Topics Concern  . Not on file   Social History Narrative    Review of Systems: Constitutional: positive for fatigue Eyes: negative Ears, nose, mouth, throat, and face: negative Respiratory: negative for dyspnea on exertion, sputum and wheezing Cardiovascular: negative for chest Foster, dyspnea, irregular heart beat and palpitations Gastrointestinal:  negative Genitourinary:negative Hematologic/lymphatic: negative Musculoskeletal:positive for back Foster and myalgias Neurological: negative Endocrine: negative  Physical Exam: Blood pressure 112/62, pulse 74, temperature 98.7 F (37.1 C), height 5' (1.524 m), weight 120 lb (54.432 kg), last menstrual period 05/14/2015, SpO2 100 %.   General Appearance:    Alert, cooperative,  Moderate distress, appears stated age  Head:    Normocephalic, without obvious abnormality, atraumatic  Eyes:    PERRL, conjunctiva/corneas clear, EOM's intact, fundi    benign, both eyes  Ears:    Normal TM's and external ear canals, both ears  Nose:   Nares normal, septum midline, mucosa normal, no drainage    or sinus tenderness  Throat:   Lips, mucosa, and tongue normal; teeth and gums normal  Neck:   Supple, symmetrical, trachea midline, no adenopathy;    thyroid:  no enlargement/tenderness/nodules; no carotid   bruit or JVD  Back:     Symmetric, no curvature, ROM normal, no CVA tenderness  Lungs:     Clear to auscultation bilaterally, respirations unlabored  Chest Wall:    No tenderness or deformity   Heart:    Regular rate and rhythm, S1 and S2 normal, no murmur, rub   or gallop  Abdomen:     Soft, non-tender, bowel sounds active all four quadrants,    no masses, no organomegaly  Extremities:   Extremities normal, atraumatic, no cyanosis or edema  Pulses:   2+ and symmetric all extremities  Skin:   Skin color, texture, turgor normal, no rashes or lesions  Lymph nodes:   Cervical, supraclavicular, and axillary nodes normal  Neurologic:   CNII-XII intact, normal strength, sensation and reflexes    throughout    Lab results: No results found for this or any previous visit (from the past 24 hour(s)).  Imaging results:  No results found.   Assessment & Plan:  Patient will be admitted to the day infusion center for extended observation  Start IV D5.45 for cellular rehydration at 150/hr  Start  Toradol 30 mg IV every 6 hours for inflammation.  Start Dilaudid PCA High Concentration per weight based protocol.   Patient will be re-evaluated for Foster intensity in the context of function and      relationship to baseline as care progresses.  If no significant Foster relief, will transfer patient to inpatient services for a       higher level of care.   Will check CMP, reticulocytes,  LDH and CBC w/differential  [name],Lachina M 05/28/2015, 9:38 AM

## 2015-05-28 NOTE — Discharge Summary (Signed)
[facility] Discharge Summary   Patient ID: Brittany Foster MRN: [id number] DOB/AGE: 03-25-1985 30 y.o.  Admit date: 05/28/2015 Discharge date: 05/28/2015  Primary Care Physician:  Brittany Lewandowsky, MD  Admission Diagnoses:  Active Problems:   Sickle cell anemia with pain [facility])  Discharge Medications:    Medication List    ASK your doctor about these medications        DULoxetine 60 MG capsule  Commonly known as:  CYMBALTA  Take 1 capsule (60 mg total) by mouth daily.     folic acid 1 MG tablet  Commonly known as:  FOLVITE  Take 1 tablet (1 mg total) by mouth daily.     gabapentin 300 MG capsule  Commonly known as:  NEURONTIN  Take 1 capsule (300 mg total) by mouth 3 (three) times daily.     hydroxyurea 500 MG capsule  Commonly known as:  HYDREA  Take 2 capsules (1,000 mg total) by mouth daily. May take with food to minimize GI side effects.     ibuprofen 200 MG tablet  Commonly known as:  ADVIL,MOTRIN  Take 200 mg by mouth every 6 (six) hours as needed for moderate pain.     morphine 30 MG 12 hr tablet  Commonly known as:  MS CONTIN  Take 1 tablet (30 mg total) by mouth every 12 (twelve) hours.     oxyCODONE-acetaminophen 10-325 MG tablet  Commonly known as:  PERCOCET  Take 1 tablet by mouth every 6 (six) hours as needed for pain.     potassium chloride SA 20 MEQ tablet  Commonly known as:  K-DUR,KLOR-CON  Take 1 tablet (20 mEq total) by mouth daily.     Topiramate ER 100 MG Cp24  Take 100 mg by mouth at bedtime.     Vitamin D3 5000 units Caps  Take 1 capsule (5,000 Units total) by mouth daily.         Consults:  None  Significant Diagnostic Studies:  No results found.   [facility] Course: Patient was admitted for extended observation. Patient has poor venous access. We were not able to access patient's port or start a peripheral IV. Notified IV team, utilized ultrasound, were unable to obtain IV access. Use TPA in  port a cath, no blood return.  Current pain intensity is 8/10. She was given Dilaudid subcutaneously for pain control. Patient appears to be resting comfortably [name] was discharged home in stable condition.  Recommend that patient schedule an appointment with primary provider in order to address removing port a cath. She states that she recently had right chest port a cath placed in [place], [place].    The patient was given clear instructions to go to ER or return to medical center if symptoms do not improve, worsen or new problems develop. The patient verbalized understanding.   Physical Exam at Discharge:   BP 112/62 mmHg  Pulse 74  Temp(Src) 98.7 F (37.1 C)  Ht 5' (1.524 m)  Wt 120 lb (54.432 kg)  BMI 23.44 kg/m2  SpO2 100%  LMP 05/14/2015  General Appearance:    Alert, cooperative, no distress, appears stated age  Head:    Normocephalic, without obvious abnormality, atraumatic  Throat:   Lips, mucosa, and tongue normal; teeth and gums normal  Neck:   Supple, symmetrical, trachea midline, no adenopathy;    thyroid:  no enlargement/tenderness/nodules; no carotid   bruit or JVD  Back:     Symmetric, no curvature,  ROM normal, no CVA tenderness  Lungs:     Clear to auscultation bilaterally, respirations unlabored  Chest Wall:    No tenderness or deformity   Heart:    Regular rate and rhythm, S1 and S2 normal, no murmur, rub   or gallop   Disposition at Discharge: 01-Home or Self Care  Discharge Orders:   Condition at Discharge:   Stable  Time spent on Discharge:  15 minutes  Signed: [name] M 05/28/2015, 12:53 PM

## 2015-05-28 NOTE — Progress Notes (Addendum)
Porta cath accessed, no blood returned after flushing several times with normal saline. TPA ordered.

## 2015-05-28 NOTE — Progress Notes (Signed)
[facility], generalized. 8/10 rating. Treated with IM Toradol, SQ Dilaudid for pain due to inability to obtain venous access in peripheral veins. Port a cath in right chest without blood return. Flushes forward smoothly. Alteplase used for dwell time of 2 hours and numerous tries to pull back blood without success, no blood return. Patient frustrated and wanting to leave. Went over discharge instructions. She has an appointment next week for port a cath problem solving/ follow up. Discharge to home. Alert, oriented and ambulatory.

## 2015-05-28 NOTE — Progress Notes (Signed)
Pt's porta cath was tpa'd at 1054 am. Unable to obtain blood return after 30 mins.  Will cont to monitor patient.

## 2015-05-30 ENCOUNTER — Telehealth (HOSPITAL_COMMUNITY): Payer: Self-pay | Admitting: *Deleted

## 2015-05-30 NOTE — Telephone Encounter (Signed)
Called complaining of back, leg and hip pain.Brittany Foster.Brittany Foster.Brittany Foster.7/10 pain rating. Denies fever, chest pain, N/V, diarrhea or abdominal pain. Took Morphine 30 mg and oxycodone 10 mg at 6 am without relief. Wanting to come to the day hospital for treatment. Spoke with medical provider, L. [name] NP and her recommendation is for patient to be seen in the ED since her port a cath is not functioning correctly, and we were unable to gain venous access earlier in this week. Patient voiced understanding.

## 2015-05-31 ENCOUNTER — Telehealth (HOSPITAL_COMMUNITY): Payer: Self-pay

## 2015-05-31 NOTE — Telephone Encounter (Signed)
Patient reports taking her pain medication at 0600. Patient rates her pain at 8/10.

## 2015-05-31 NOTE — Telephone Encounter (Signed)
Patient called complaining of pain in her legs, hips, and back. Patient denies chest pain, fever, nausea/vomiting, abdominal pain, and diarrhea.

## 2015-05-31 NOTE — Telephone Encounter (Signed)
Returned call to patient and informed her that [place], NP reports that [facility].  However, due to the inability to access her port and being unsuccessful starting an IV on her previous visit she would have to received SQ pain medication.   Patient got upset and terminated the call.

## 2015-06-03 ENCOUNTER — Telehealth (HOSPITAL_COMMUNITY): Payer: Self-pay | Admitting: *Deleted

## 2015-06-03 ENCOUNTER — Telehealth: Payer: Self-pay | Admitting: Hematology

## 2015-06-03 ENCOUNTER — Telehealth (HOSPITAL_COMMUNITY): Payer: Self-pay | Admitting: Hematology

## 2015-06-03 NOTE — Telephone Encounter (Signed)
Patient called in to advise that she went to the ED in [place], and was told to call here. Advised patient I would talk with Armeniahina, NP and give her a call back.

## 2015-06-03 NOTE — Telephone Encounter (Signed)
Attempted to call patient back in regards to her previous call.  No answer and unable to leave message on machine.  Spoke with Armeniahina, NP and she advises the patient needs to keep her appointment tomorrow with Dr. Hyman HopesJegede to discuss options in regards to her port-a-cath.

## 2015-06-03 NOTE — Telephone Encounter (Signed)
[name], a 30 year old female with a history of sickle cell anemia, HbSS called complaining of sickle cell pain. Patient was evaluated on 05/27/2015 and 05/28/2015 in the day infusion center. The nursing staff and IV team were not able access port or start an IV. Patient has an appointment in the clinic on 06/04/2015, advised patient to come to appointment so that we can appropriately address medication regimen and replacing current port a cath in interventional radiology.  Patient is requesting to come to day infusion center on today for staff to attempt to access port a cath. Patient was informed that staff will not attempt to access port due to risk of infection. Port will need to be evaluated in interventional radiology. She was advised to continue home medications and hydration for further pain control. Recommend that patient report to emergency department if pain persists.    Massie MaroonHollis,[name] M, FNP

## 2015-06-03 NOTE — Telephone Encounter (Signed)
Called complaining of back, legs, and hip pain.Brittany Foster.Brittany Foster.Brittany Foster.8/10 rating. Denies fever, chest pain abdominal pain, N/V or diarrhea. Took morphine 30 mg and oxycodone 10 mg at 6 am without relief. Wants to come in for treatment. Informed patient that I would inform the medical provider. Telford NabL. [name] NP notified and she contacted Tynasia by telephone and spoke with her.

## 2015-06-03 NOTE — Telephone Encounter (Signed)
Received a note that the patient is asking for a return call.  I called and spoke with patient and advised the process for receiving treatment in the day hospital.  Patient states she feels like we are "giving up on her".  I explained to her that we have not given up on her.  She is aware that she has limited vascular access.  At her previous visit when the IV team looked with the ultrasound and couldn't find anything, we offered Subcutaneous injections, which the patient refused. This would be the only way for her to receive pain medications without a peripheral IV or her port being accessed. During this last visit, TPA was placed in the port-a-cath, but still blood was not returned.  I explained that frequent accessing of her port without blood return puts her at risk for infection.  Patient verbalizes understanding.  I explained to patient to keep her appointment for tomorrow with her PCP and he can give orders to have interventional radiology look at the port placement.  Patient verbalizes understanding.

## 2015-06-04 ENCOUNTER — Ambulatory Visit: Payer: Medicaid Other | Admitting: Internal Medicine

## 2015-06-10 ENCOUNTER — Telehealth: Payer: Self-pay

## 2015-06-10 ENCOUNTER — Telehealth (HOSPITAL_COMMUNITY): Payer: Self-pay | Admitting: *Deleted

## 2015-06-10 ENCOUNTER — Encounter (HOSPITAL_COMMUNITY): Payer: Self-pay | Admitting: *Deleted

## 2015-06-10 ENCOUNTER — Other Ambulatory Visit: Payer: Self-pay | Admitting: Family Medicine

## 2015-06-10 ENCOUNTER — Non-Acute Institutional Stay (HOSPITAL_COMMUNITY)
Admission: AD | Admit: 2015-06-10 | Discharge: 2015-06-10 | Disposition: A | Discharge status: Home / Self Care | Payer: Medicaid Other | Source: Ambulatory Visit | Attending: Internal Medicine | Admitting: Internal Medicine

## 2015-06-10 DIAGNOSIS — Z95828 Presence of other vascular implants and grafts: Secondary | ICD-10-CM

## 2015-06-10 DIAGNOSIS — Z87891 Personal history of nicotine dependence: Secondary | ICD-10-CM | POA: Diagnosis not present

## 2015-06-10 DIAGNOSIS — Z79899 Other long term (current) drug therapy: Secondary | ICD-10-CM | POA: Insufficient documentation

## 2015-06-10 DIAGNOSIS — D5700 Hb-SS disease with crisis, unspecified: Secondary | ICD-10-CM | POA: Insufficient documentation

## 2015-06-10 DIAGNOSIS — Z791 Long term (current) use of non-steroidal anti-inflammatories (NSAID): Secondary | ICD-10-CM | POA: Diagnosis not present

## 2015-06-10 DIAGNOSIS — I878 Other specified disorders of veins: Secondary | ICD-10-CM

## 2015-06-10 DIAGNOSIS — Z79891 Long term (current) use of opiate analgesic: Secondary | ICD-10-CM | POA: Diagnosis not present

## 2015-06-10 DIAGNOSIS — R52 Pain, unspecified: Secondary | ICD-10-CM | POA: Diagnosis present

## 2015-06-10 LAB — COMPREHENSIVE METABOLIC PANEL
ALK PHOS: 93 U/L (ref 38–126)
ALT: 39 U/L (ref 14–54)
ANION GAP: 7 (ref 5–15)
AST: 54 U/L — ABNORMAL HIGH (ref 15–41)
Albumin: 4.2 g/dL (ref 3.5–5.0)
BUN: 7 mg/dL (ref 6–20)
CALCIUM: 8.7 mg/dL — AB (ref 8.9–10.3)
CO2: 24 mmol/L (ref 22–32)
CREATININE: 0.42 mg/dL — AB (ref 0.44–1.00)
Chloride: 111 mmol/L (ref 101–111)
Glucose, Bld: 132 mg/dL — ABNORMAL HIGH (ref 65–99)
Potassium: 3.5 mmol/L (ref 3.5–5.1)
Sodium: 142 mmol/L (ref 135–145)
Total Bilirubin: 1.9 mg/dL — ABNORMAL HIGH (ref 0.3–1.2)
Total Protein: 7.3 g/dL (ref 6.5–8.1)

## 2015-06-10 LAB — CBC WITH DIFFERENTIAL/PLATELET
Basophils Absolute: 0 10*3/uL (ref 0.0–0.1)
Basophils Relative: 0 %
EOS ABS: 0 10*3/uL (ref 0.0–0.7)
EOS PCT: 0 %
HCT: 23.2 % — ABNORMAL LOW (ref 36.0–46.0)
Hemoglobin: 7.9 g/dL — ABNORMAL LOW (ref 12.0–15.0)
LYMPHS ABS: 3.5 10*3/uL (ref 0.7–4.0)
Lymphocytes Relative: 28 %
MCH: 30.9 pg (ref 26.0–34.0)
MCHC: 34.1 g/dL (ref 30.0–36.0)
MCV: 90.6 fL (ref 78.0–100.0)
MONO ABS: 0.7 10*3/uL (ref 0.1–1.0)
Monocytes Relative: 6 %
NEUTROS PCT: 66 %
Neutro Abs: 8.2 10*3/uL — ABNORMAL HIGH (ref 1.7–7.7)
PLATELETS: 334 10*3/uL (ref 150–400)
RBC: 2.56 MIL/uL — AB (ref 3.87–5.11)
RDW: 30 % — AB (ref 11.5–15.5)
WBC: 12.4 10*3/uL — AB (ref 4.0–10.5)

## 2015-06-10 LAB — RETICULOCYTES
RBC.: 2.56 MIL/uL — ABNORMAL LOW (ref 3.87–5.11)
RETIC COUNT ABSOLUTE: 373.8 10*3/uL — AB (ref 19.0–186.0)
RETIC CT PCT: 14.6 % — AB (ref 0.4–3.1)

## 2015-06-10 MED ORDER — DEXTROSE-NACL 5-0.45 % IV SOLN
INTRAVENOUS | Status: DC
  Administered 2015-06-10: 12:00:00 via INTRAVENOUS

## 2015-06-10 MED ORDER — HYDROMORPHONE 1 MG/ML IV SOLN
INTRAVENOUS | Status: DC
  Administered 2015-06-10: 12 mg via INTRAVENOUS
  Administered 2015-06-10: 13:00:00 via INTRAVENOUS
  Filled 2015-06-10: qty 25

## 2015-06-10 MED ORDER — OXYCODONE HCL 5 MG PO TABS
5.0000 mg | ORAL_TABLET | Freq: Once | ORAL | Status: AC
  Administered 2015-06-10: 5 mg via ORAL
  Filled 2015-06-10: qty 1

## 2015-06-10 MED ORDER — KETOROLAC TROMETHAMINE 30 MG/ML IJ SOLN
30.0000 mg | Freq: Once | INTRAMUSCULAR | Status: AC
  Administered 2015-06-10: 30 mg via INTRAVENOUS
  Filled 2015-06-10: qty 1

## 2015-06-10 MED ORDER — OXYCODONE-ACETAMINOPHEN 5-325 MG PO TABS
1.0000 | ORAL_TABLET | Freq: Once | ORAL | Status: AC
  Administered 2015-06-10: 1 via ORAL
  Filled 2015-06-10: qty 1

## 2015-06-10 MED ORDER — DIPHENHYDRAMINE HCL 12.5 MG/5ML PO ELIX: 12.5000 mg | ORAL_SOLUTION | Freq: Four times a day (QID) | ORAL | Status: DC | PRN

## 2015-06-10 MED ORDER — NALOXONE HCL 0.4 MG/ML IJ SOLN: 0.4000 mg | INTRAMUSCULAR | Status: DC | PRN

## 2015-06-10 MED ORDER — SODIUM CHLORIDE 0.9 % IV SOLN
12.5000 mg | Freq: Four times a day (QID) | INTRAVENOUS | Status: DC | PRN
  Administered 2015-06-10: 12.5 mg via INTRAVENOUS
  Filled 2015-06-10 (×2): qty 0.25

## 2015-06-10 MED ORDER — ONDANSETRON HCL 4 MG/2ML IJ SOLN: 4.0000 mg | Freq: Four times a day (QID) | INTRAMUSCULAR | Status: DC | PRN

## 2015-06-10 MED ORDER — HYDROMORPHONE HCL 2 MG/ML IJ SOLN: 2.0000 mg | Freq: Once | INTRAMUSCULAR | Status: DC

## 2015-06-10 MED ORDER — HYDROMORPHONE HCL 2 MG/ML IJ SOLN
2.0000 mg | Freq: Once | INTRAMUSCULAR | Status: AC
Start: 2015-06-10 — End: 2015-06-10
  Administered 2015-06-10: 2 mg via SUBCUTANEOUS
  Filled 2015-06-10: qty 1

## 2015-06-10 MED ORDER — SODIUM CHLORIDE 0.9% FLUSH: 9.0000 mL | INTRAVENOUS | Status: DC | PRN

## 2015-06-10 NOTE — Telephone Encounter (Signed)
Pt called and told that she could come to the Day hospital for treatment after speaking with C. Hart RochesterHollis, NP. Pt was told she needed to be here by 12 noon to get her treatment. Pt voiced understanding.

## 2015-06-10 NOTE — H&P (Signed)
[facility] History and Physical   Date: 06/10/2015  Patient name: Brittany Foster Medical record number: [id number] Date of birth: 1985-07-17 Age: 30 y.o. Gender: female PCP: Jeanann Lewandowsky, MD  Attending physician: Quentin Angst, MD  Chief Complaint: Generalized Foster  History of Present Illness: Brittany Foster, a 30 year old female with a history of sickle cell anemia, HbSS presents complaining of generalize Foster. Patient was seen in the day infusion center on 05/28/2015. [name] reports that she was recently hospitalized in [place] at [facility]. She has poor venous access and staff was unable to obtain blood return from port-a cath. She states that Foster intensity had been increasing throughout the weekend since hospital discharge. Foster intensity has not been controlled by home medications.   She last had Percocet 10-325 mg and MS Contin around 6 am with minimal relief. She states that she is taking all other medications consistently. She denies headache, chest Foster, shortness of breath, nausea, vomiting, dysuria, or diarrhea.   Meds: Prescriptions prior to admission  Medication Sig Dispense Refill Last Dose  . Cholecalciferol (VITAMIN D3) 5000 UNITS CAPS Take 1 capsule (5,000 Units total) by mouth daily. (Patient not taking: Reported on 03/07/2015) 30 capsule 5 Unknown at Unknown time  . DULoxetine (CYMBALTA) 60 MG capsule Take 1 capsule (60 mg total) by mouth daily. 30 capsule 3 05/28/2015 at Unknown time  . folic acid (FOLVITE) 1 MG tablet Take 1 tablet (1 mg total) by mouth daily. 90 tablet 11 05/27/2015 at Unknown time  . gabapentin (NEURONTIN) 300 MG capsule Take 1 capsule (300 mg total) by mouth 3 (three) times daily. 90 capsule 2 Unknown at Unknown time  . hydroxyurea (HYDREA) 500 MG capsule Take 2 capsules (1,000 mg total) by mouth daily. May take with food to minimize GI side effects. 60 capsule 3 05/28/2015 at Unknown time  . ibuprofen  (ADVIL,MOTRIN) 200 MG tablet Take 200 mg by mouth every 6 (six) hours as needed for moderate Foster.   05/27/2015 at Unknown time  . morphine (MS CONTIN) 30 MG 12 hr tablet Take 1 tablet (30 mg total) by mouth every 12 (twelve) hours. 30 tablet 0 05/28/2015 at Unknown time  . oxyCODONE-acetaminophen (PERCOCET) 10-325 MG tablet Take 1 tablet by mouth every 6 (six) hours as needed for Foster. 60 tablet 0 05/28/2015 at Unknown time  . potassium chloride SA (K-DUR,KLOR-CON) 20 MEQ tablet Take 1 tablet (20 mEq total) by mouth daily. 30 tablet 2 05/27/2015 at Unknown time  . Topiramate ER 100 MG CP24 Take 100 mg by mouth at bedtime.   05/27/2015 at Unknown time    Allergies: Ultram; Zofran; Morphine and related; and Tape Past Medical History  Diagnosis Date  . Sickle cell anemia (HCC)   . Anemia    Past Surgical History  Procedure Laterality Date  . Tubal ligation    . Cholecystectomy    . Cesarean section    . Port a cath placement Right     about 6-7 years ago  . Cholecystectomy  2000   Family History  Problem Relation Age of Onset  . Sickle cell anemia Other   . Sickle cell trait Father   . Sickle cell trait Mother    Social History   Social History  . Marital Status: Single    Spouse Name: N/A  . Number of Children: N/A  . Years of Education: N/A   Occupational History  . Not on file.  Social History Main Topics  . Smoking status: Former Games developermoker  . Smokeless tobacco: Not on file     Comment: smokes once every couple of weeks.   . Alcohol Use: No  . Drug Use: No  . Sexual Activity: Yes    Birth Control/ Protection: Surgical   Other Topics Concern  . Not on file   Social History Narrative    Review of Systems: Constitutional: positive for fatigue Eyes: negative Ears, nose, mouth, throat, and face: negative Respiratory: negative for dyspnea on exertion, sputum and wheezing Cardiovascular: negative for chest Foster, dyspnea, irregular heart beat and  palpitations Gastrointestinal: negative Genitourinary:negative Hematologic/lymphatic: negative Musculoskeletal:positive for back Foster and myalgias Neurological: negative Endocrine: negative  Physical Exam: Last menstrual period 05/14/2015.   General Appearance:    Alert, cooperative,  Moderate distress, appears stated age  Head:    Normocephalic, without obvious abnormality, atraumatic  Eyes:    PERRL, conjunctiva/corneas clear, EOM's intact, fundi    benign, both eyes  Ears:    Normal TM's and external ear canals, both ears  Nose:   Nares normal, septum midline, mucosa normal, no drainage    or sinus tenderness  Throat:   Lips, mucosa, and tongue normal; teeth and gums normal  Neck:   Supple, symmetrical, trachea midline, no adenopathy;    thyroid:  no enlargement/tenderness/nodules; no carotid   bruit or JVD  Back:     Symmetric, no curvature, ROM normal, no CVA tenderness  Lungs:     Clear to auscultation bilaterally, respirations unlabored  Chest Wall:    No tenderness or deformity   Heart:    Regular rate and rhythm, S1 and S2 normal, no murmur, rub   or gallop  Abdomen:     Soft, non-tender, bowel sounds active all four quadrants,    no masses, no organomegaly  Extremities:   Extremities normal, atraumatic, no cyanosis or edema  Pulses:   2+ and symmetric all extremities  Skin:   Skin color, texture, turgor normal, no rashes or lesions  Lymph nodes:   Cervical, supraclavicular, and axillary nodes normal  Neurologic:   CNII-XII intact, normal strength, sensation and reflexes    throughout    Lab results: No results found for this or any previous visit (from the past 24 hour(s)).  Imaging results:  No results found.   Assessment & Plan:  Patient will be admitted to the day infusion center for extended observation  Start IV D5.45 for cellular rehydration at 150/hr  Start Toradol 30 mg IV every 6 hours for inflammation.  Start Dilaudid PCA High Concentration per  weight based protocol.   Patient will be re-evaluated for Foster intensity in the context of function and      relationship to baseline as care progresses.  If no significant Foster relief, will transfer patient to inpatient services for a       higher level of care.   Will check CMP, reticulocytes,  LDH and CBC w/differential  Sigmund Morera M 06/10/2015, 11:59 AM

## 2015-06-10 NOTE — Telephone Encounter (Signed)
Patient called [facility]. Pt states her pain is 8/10. Her last pain med was Morphine 30 mg and Oxy 10 mg at 6am. Pt states her pain is in her legs, back and hips. Will check with the provider and give her a call back. Pt voiced understanding.

## 2015-06-10 NOTE — Telephone Encounter (Signed)
Refill request for oxycodone and morphine LOV 03/26/2015. Please advise. Thanks!

## 2015-06-10 NOTE — Progress Notes (Signed)
Pt received [facility]. Pt was treated with IV fluids, SQ Dilaudid, and Dilaudid PCA. Pt stated her pain was 8 on admission and down to 7 at discharge. Discharge instructions given to patient with verbal understanding. Pt was alert, oriented and ambulatory at discharge.

## 2015-06-10 NOTE — Discharge Summary (Signed)
[facility] Discharge Summary   Patient ID: Brittany Foster MRN: [id number] DOB/AGE: 31/31/1987 31 y.o.  Admit date: 06/10/2015 Discharge date: 06/10/2015  Primary Care Physician:  Jeanann LewandowskyJEGEDE, OLUGBEMIGA, MD  Admission Diagnoses:  Active Problems:   Sickle cell anemia with pain [facility](HCC)  Discharge Medications:    Medication List    ASK your doctor about these medications        DULoxetine 60 MG capsule  Commonly known as:  CYMBALTA  Take 1 capsule (60 mg total) by mouth daily.     folic acid 1 MG tablet  Commonly known as:  FOLVITE  Take 1 tablet (1 mg total) by mouth daily.     gabapentin 300 MG capsule  Commonly known as:  NEURONTIN  Take 1 capsule (300 mg total) by mouth 3 (three) times daily.     hydroxyurea 500 MG capsule  Commonly known as:  HYDREA  Take 2 capsules (1,000 mg total) by mouth daily. May take with food to minimize GI side effects.     ibuprofen 200 MG tablet  Commonly known as:  ADVIL,MOTRIN  Take 200 mg by mouth every 6 (six) hours as needed for moderate pain.     morphine 30 MG 12 hr tablet  Commonly known as:  MS CONTIN  Take 1 tablet (30 mg total) by mouth every 12 (twelve) hours.     oxyCODONE-acetaminophen 10-325 MG tablet  Commonly known as:  PERCOCET  Take 1 tablet by mouth every 6 (six) hours as needed for pain.     potassium chloride SA 20 MEQ tablet  Commonly known as:  K-DUR,KLOR-CON  Take 1 tablet (20 mEq total) by mouth daily.     Topiramate ER 100 MG Cp24  Take 100 mg by mouth at bedtime.     Vitamin D3 5000 units Caps  Take 1 capsule (5,000 Units total) by mouth daily.         Consults:  None  Significant Diagnostic Studies:  No results found.   [facility] Course: Patient was admitted for extended observation. Patient has poor venous access. We were not able to access patient's port.  We were able to  start a peripheral IV. Notified IV team, utilized ultrasound.  Laboratory values  reviewed, consistent with baseline.  Started dilaudid PCA per weight based protocol. Patient used a total of 12 mg with 23 demands and 23 deliveries.  Current pain intensity is 6/10.  Patient appears to be resting comfortably. Recommend that she continue home medications at current dosage.   [name] was discharged home in stable condition. Recommend that patient re-schedule appointment with primary provider.    The patient was given clear instructions to go to ER or return to medical center if symptoms do not improve, worsen or new problems develop. The patient verbalized understanding.   Physical Exam at Discharge:   BP 93/53 mmHg  Pulse 89  Temp(Src) 98.6 F (37 C) (Oral)  Resp 12  Ht 5' (1.524 m)  Wt 122 lb (55.339 kg)  BMI 23.83 kg/m2  SpO2 96%  LMP 06/10/2015  General Appearance:    Alert, cooperative, no distress, appears stated age  Head:    Normocephalic, without obvious abnormality, atraumatic  Throat:   Lips, mucosa, and tongue normal; teeth and gums normal  Neck:   Supple, symmetrical, trachea midline, no adenopathy;    thyroid:  no enlargement/tenderness/nodules; no carotid   bruit or JVD  Back:     Symmetric,  no curvature, ROM normal, no CVA tenderness  Lungs:     Clear to auscultation bilaterally, respirations unlabored  Chest Wall:    No tenderness or deformity   Heart:    Regular rate and rhythm, S1 and S2 normal, no murmur, rub   or gallop   Disposition at Discharge: 01-Home or Self Care  Discharge Orders:   Condition at Discharge:   Stable  Time spent on Discharge:  15 minutes  Signed: Granger Chui M 06/10/2015, 3:40 PM

## 2015-06-10 NOTE — Telephone Encounter (Signed)
Pt is requesting a medication refill on her Oxycodone and Morphine. Thanks!

## 2015-06-10 NOTE — Discharge Instructions (Signed)
Sickle Cell Anemia, Adult Sickle cell anemia is a condition in which red blood cells have an abnormal "sickle" shape. This abnormal shape shortens the cells' life span, which results in a lower than normal concentration of red blood cells in the blood. The sickle shape also causes the cells to clump together and block free blood flow through the blood vessels. As a result, the tissues and organs of the body do not receive enough oxygen. Sickle cell anemia causes organ damage and pain and increases the risk of infection. CAUSES  Sickle cell anemia is a genetic disorder. Those who receive two copies of the gene have the condition, and those who receive one copy have the trait. RISK FACTORS The sickle cell gene is most common in people whose families originated in [place]. Other areas of the globe where sickle cell trait occurs include the Mediterranean, [place] and [place], the [place], and the [place].  SIGNS AND SYMPTOMS  Pain, especially in the extremities, back, chest, or abdomen (common). The pain may start suddenly or may develop following an illness, especially if there is dehydration. Pain can also occur due to overexertion or exposure to extreme temperature changes.  Frequent severe bacterial infections, especially certain types of pneumonia and meningitis.  Pain and swelling in the hands and feet.  Decreased activity.   Loss of appetite.   Change in behavior.  Headaches.  Seizures.  Shortness of breath or difficulty breathing.  Vision changes.  Skin ulcers. Those with the trait may not have symptoms or they may have mild symptoms.  DIAGNOSIS  Sickle cell anemia is diagnosed with blood tests that demonstrate the genetic trait. It is often diagnosed during the newborn period, due to mandatory testing nationwide. A variety of blood tests, X-rays, CT scans, MRI scans, ultrasounds, and lung function tests may also be done to monitor the condition. TREATMENT  Sickle  cell anemia may be treated with:  Medicines. You may be given pain medicines, antibiotic medicines (to treat and prevent infections) or medicines to increase the production of certain types of hemoglobin.  Fluids.  Oxygen.  Blood transfusions. HOME CARE INSTRUCTIONS   Drink enough fluid to keep your urine clear or pale yellow. Increase your fluid intake in hot weather and during exercise.  Do not smoke. Smoking lowers oxygen levels in the blood.   Only take over-the-counter or prescription medicines for pain, fever, or discomfort as directed by your health care provider.  Take antibiotics as directed by your health care provider. Make sure you finish them it even if you start to feel better.   Take supplements as directed by your health care provider.   Consider wearing a medical alert bracelet. This tells anyone caring for you in an emergency of your condition.   When traveling, keep your medical information, health care provider's names, and the medicines you take with you at all times.   If you develop a fever, do not take medicines to reduce the fever right away. This could cover up a problem that is developing. Notify your health care provider.  Keep all follow-up appointments with your health care provider. Sickle cell anemia requires regular medical care. SEEK MEDICAL CARE IF: You have a fever. SEEK IMMEDIATE MEDICAL CARE IF:   You feel dizzy or faint.   You have new abdominal pain, especially on the left side near the stomach area.   You develop a persistent, often uncomfortable and painful penile erection (priapism). If this is not treated immediately it   will lead to impotence.   You have numbness your arms or legs or you have a hard time moving them.   You have a hard time with speech.   You have a fever or persistent symptoms for more than 2-3 days.   You have a fever and your symptoms suddenly get worse.   You have signs or symptoms of infection.  These include:   Chills.   Abnormal tiredness (lethargy).   Irritability.   Poor eating.   Vomiting.   You develop pain that is not helped with medicine.   You develop shortness of breath.  You have pain in your chest.   You are coughing up pus-like or bloody sputum.   You develop a stiff neck.  Your feet or hands swell or have pain.  Your abdomen appears bloated.  You develop joint pain. MAKE SURE YOU:  Understand these instructions.   This information is not intended to replace advice given to you by your health care provider. Make sure you discuss any questions you have with your health care provider.   Document Released: 05/27/2005 Document Revised: 03/09/2014 Document Reviewed: 09/28/2012 Elsevier Interactive Patient Education 2016 [employer]  

## 2015-06-10 NOTE — Progress Notes (Signed)
[name], a [age] year old female with a history of sickle cell anemia, HbSS presents to the day infusion center for extended observation. Patient has a right chest port without blood return during the last two admissions for outpatient treatment. I will send a referral to interventional radiology for further evaluation.    Brittany Foster,Brittany Mattie M, FNP

## 2015-06-11 ENCOUNTER — Other Ambulatory Visit: Payer: Self-pay | Admitting: Internal Medicine

## 2015-06-11 DIAGNOSIS — D571 Sickle-cell disease without crisis: Secondary | ICD-10-CM

## 2015-06-11 MED ORDER — OXYCODONE-ACETAMINOPHEN 10-325 MG PO TABS: 1.0000 | ORAL_TABLET | Freq: Four times a day (QID) | ORAL | Status: DC | PRN

## 2015-06-11 MED ORDER — MORPHINE SULFATE ER 30 MG PO TBCR: 30.0000 mg | EXTENDED_RELEASE_TABLET | Freq: Two times a day (BID) | ORAL | Status: DC

## 2015-06-17 ENCOUNTER — Inpatient Hospital Stay (HOSPITAL_COMMUNITY): Admission: RE | Admit: 2015-06-17 | Payer: Medicaid Other | Source: Ambulatory Visit

## 2015-06-20 ENCOUNTER — Telehealth (HOSPITAL_COMMUNITY): Payer: Self-pay | Admitting: *Deleted

## 2015-06-20 NOTE — Telephone Encounter (Signed)
Returned call to patient concerning pain, after speaking with L. [name] NP. Advised to follow up with the medical provider that she saw yesterday for treatment of pain. Also let her know that she needs to follow up with an office visit. Patient voiced understanding and states "I wasn't seen by anyone yesterday".

## 2015-06-20 NOTE — Telephone Encounter (Signed)
Patient actually called in at [facility]0805

## 2015-06-20 NOTE — Telephone Encounter (Signed)
[facility], hip and bilateral leg pain.  Rating 8/10. Denies fever, chest pain, N/V, diarrhea or abdominal pain. Took Morphine and Oxycodone at 6 am this morning without relief. Explained that I would let the medical provider know and call her back

## 2015-06-21 ENCOUNTER — Telehealth (HOSPITAL_COMMUNITY): Payer: Self-pay | Admitting: Internal Medicine

## 2015-06-21 NOTE — Telephone Encounter (Signed)
Pt called and states experiencing pain in legs and back; notified pt that, per NP, she will need to take home medications as prescribed; pt informed that she needs to come to a primary care visit in order to discuss dosing of home long-term acting pain medications for chronic pain; pt verbalizes understanding

## 2015-06-24 ENCOUNTER — Telehealth: Payer: Self-pay | Admitting: *Deleted

## 2015-06-24 NOTE — Telephone Encounter (Addendum)
Advised from L. [name] NP for patient to come tomorrow to her appointment as they discussed on Friday. Called patient and advised Brittany Foster. She agreed.  (de[facility] At [facility] not [facility]

## 2015-06-24 NOTE — Telephone Encounter (Signed)
Called complaining of 9/10 pain in legs, back and arms. Denies fever, Chest pain and shortness of breath. Denies abdominal pain, N/V, diarrhea. Took Morphine 30 and oxycodone 10 mg at 6:30 am without relief. Will notify medical provider and return call to patient. She understands.

## 2015-06-25 ENCOUNTER — Ambulatory Visit: Payer: Medicaid Other | Admitting: Internal Medicine

## 2015-06-25 ENCOUNTER — Encounter (HOSPITAL_COMMUNITY): Payer: Self-pay | Admitting: *Deleted

## 2015-06-25 ENCOUNTER — Ambulatory Visit (INDEPENDENT_AMBULATORY_CARE_PROVIDER_SITE_OTHER): Payer: Medicaid Other | Admitting: Internal Medicine

## 2015-06-25 ENCOUNTER — Other Ambulatory Visit: Payer: Self-pay | Admitting: Internal Medicine

## 2015-06-25 ENCOUNTER — Non-Acute Institutional Stay (HOSPITAL_COMMUNITY)
Admission: AD | Admit: 2015-06-25 | Discharge: 2015-06-25 | Disposition: A | Discharge status: Home / Self Care | Payer: Medicaid Other | Source: Ambulatory Visit | Attending: Internal Medicine | Admitting: Internal Medicine

## 2015-06-25 DIAGNOSIS — R52 Pain, unspecified: Secondary | ICD-10-CM | POA: Diagnosis present

## 2015-06-25 DIAGNOSIS — Z79899 Other long term (current) drug therapy: Secondary | ICD-10-CM | POA: Insufficient documentation

## 2015-06-25 DIAGNOSIS — Z791 Long term (current) use of non-steroidal anti-inflammatories (NSAID): Secondary | ICD-10-CM | POA: Insufficient documentation

## 2015-06-25 DIAGNOSIS — D5700 Hb-SS disease with crisis, unspecified: Secondary | ICD-10-CM | POA: Diagnosis not present

## 2015-06-25 DIAGNOSIS — D571 Sickle-cell disease without crisis: Secondary | ICD-10-CM

## 2015-06-25 LAB — URINALYSIS, ROUTINE W REFLEX MICROSCOPIC
Bilirubin Urine: NEGATIVE
GLUCOSE, UA: NEGATIVE mg/dL
HGB URINE DIPSTICK: NEGATIVE
KETONES UR: NEGATIVE mg/dL
LEUKOCYTES UA: NEGATIVE
Nitrite: NEGATIVE
PH: 6.5 (ref 5.0–8.0)
Protein, ur: NEGATIVE mg/dL
Specific Gravity, Urine: 1.013 (ref 1.005–1.030)

## 2015-06-25 LAB — CBC WITH DIFFERENTIAL/PLATELET
BASOS ABS: 0 10*3/uL (ref 0.0–0.1)
Basophils Relative: 0 %
EOS ABS: 0 10*3/uL (ref 0.0–0.7)
Eosinophils Relative: 0 %
HEMATOCRIT: 25.9 % — AB (ref 36.0–46.0)
Hemoglobin: 8.9 g/dL — ABNORMAL LOW (ref 12.0–15.0)
LYMPHS ABS: 3.6 10*3/uL (ref 0.7–4.0)
Lymphocytes Relative: 28 %
MCH: 30.1 pg (ref 26.0–34.0)
MCHC: 34.4 g/dL (ref 30.0–36.0)
MCV: 87.5 fL (ref 78.0–100.0)
MONO ABS: 1.2 10*3/uL — AB (ref 0.1–1.0)
MONOS PCT: 9 %
NEUTROS ABS: 8.2 10*3/uL — AB (ref 1.7–7.7)
Neutrophils Relative %: 63 %
Platelets: 403 10*3/uL — ABNORMAL HIGH (ref 150–400)
RBC: 2.96 MIL/uL — AB (ref 3.87–5.11)
RDW: 26.4 % — AB (ref 11.5–15.5)
WBC: 13 10*3/uL — AB (ref 4.0–10.5)

## 2015-06-25 LAB — RETICULOCYTES
RBC.: 2.96 MIL/uL — AB (ref 3.87–5.11)
RETIC COUNT ABSOLUTE: 370 10*3/uL — AB (ref 19.0–186.0)
Retic Ct Pct: 12.5 % — ABNORMAL HIGH (ref 0.4–3.1)

## 2015-06-25 MED ORDER — SODIUM CHLORIDE 0.9% FLUSH: 9.0000 mL | INTRAVENOUS | Status: DC | PRN

## 2015-06-25 MED ORDER — SENNOSIDES-DOCUSATE SODIUM 8.6-50 MG PO TABS
1.0000 | ORAL_TABLET | Freq: Two times a day (BID) | ORAL | Status: DC
  Filled 2015-06-25: qty 1

## 2015-06-25 MED ORDER — DIPHENHYDRAMINE HCL 50 MG/ML IJ SOLN
25.0000 mg | INTRAMUSCULAR | Status: DC | PRN
  Administered 2015-06-25: 25 mg via INTRAVENOUS
  Filled 2015-06-25 (×3): qty 0.5

## 2015-06-25 MED ORDER — POLYETHYLENE GLYCOL 3350 17 G PO PACK
17.0000 g | PACK | Freq: Every day | ORAL | Status: DC | PRN
  Filled 2015-06-25: qty 1

## 2015-06-25 MED ORDER — KETOROLAC TROMETHAMINE 30 MG/ML IJ SOLN
30.0000 mg | Freq: Four times a day (QID) | INTRAMUSCULAR | Status: DC
  Administered 2015-06-25: 30 mg via INTRAVENOUS
  Filled 2015-06-25: qty 1

## 2015-06-25 MED ORDER — SODIUM CHLORIDE 0.9% FLUSH
10.0000 mL | INTRAVENOUS | Status: DC | PRN
Start: 2015-06-25 — End: 2015-06-25

## 2015-06-25 MED ORDER — FOLIC ACID 1 MG PO TABS
1.0000 mg | ORAL_TABLET | Freq: Every day | ORAL | Status: DC
  Administered 2015-06-25: 1 mg via ORAL
  Filled 2015-06-25: qty 1

## 2015-06-25 MED ORDER — DIPHENHYDRAMINE HCL 25 MG PO CAPS: 25.0000 mg | ORAL_CAPSULE | ORAL | Status: DC | PRN

## 2015-06-25 MED ORDER — DEXTROSE-NACL 5-0.45 % IV SOLN
INTRAVENOUS | Status: DC
  Administered 2015-06-25: 125 mL/h via INTRAVENOUS

## 2015-06-25 MED ORDER — MORPHINE SULFATE ER 30 MG PO TBCR: 30.0000 mg | EXTENDED_RELEASE_TABLET | Freq: Two times a day (BID) | ORAL | Status: DC

## 2015-06-25 MED ORDER — OXYCODONE-ACETAMINOPHEN 10-325 MG PO TABS: 1.0000 | ORAL_TABLET | Freq: Four times a day (QID) | ORAL | Status: DC | PRN

## 2015-06-25 MED ORDER — HEPARIN SOD (PORK) LOCK FLUSH 100 UNIT/ML IV SOLN
500.0000 [IU] | INTRAVENOUS | Status: DC | PRN
Start: 2015-06-25 — End: 2015-06-25

## 2015-06-25 MED ORDER — PROCHLORPERAZINE EDISYLATE 5 MG/ML IJ SOLN
10.0000 mg | Freq: Once | INTRAMUSCULAR | Status: AC
  Administered 2015-06-25: 10 mg via INTRAVENOUS
  Filled 2015-06-25: qty 2

## 2015-06-25 MED ORDER — NALOXONE HCL 0.4 MG/ML IJ SOLN: 0.4000 mg | INTRAMUSCULAR | Status: DC | PRN

## 2015-06-25 MED ORDER — HYDROMORPHONE 1 MG/ML IV SOLN
INTRAVENOUS | Status: DC
  Administered 2015-06-25: 13:00:00 via INTRAVENOUS
  Filled 2015-06-25: qty 25

## 2015-06-25 NOTE — H&P (Signed)
[facility] History and Physical  Wylma Tatem ZOX:[id number] DOB: [birth year] DOA: 06/25/2015  PCP: Jeanann Lewandowsky, MD   Chief Complaint: No chief complaint on file.   HPI: Brittany Foster is a 30 y.o. female with history of sickle cell anemia, HbSS presents initially to the clinic for her routine follow up but with  Complaints of generalize body pain. Pain intensity 10/10, has not been controlled by home medications.She last had Percocet 10-325 mg and MS Contin this morning with no sustained relief. She states that she is taking all other medications consistently. she says she has been going through a lot of stress, she says she has no help at home, she lives alone with her daughter, being a single mother, has no job and no means of transportation. Her family is in [place], [place] but she is not willing to relocate to [place]. She denies headache, chest pain, shortness of breath, nausea, vomiting, dysuria, or diarrhea.   Systemic Review: General: The patient denies anorexia, fever, weight loss Cardiac: Denies chest pain, syncope, palpitations, pedal edema  Respiratory: Denies cough, shortness of breath, wheezing GI: Denies severe indigestion/heartburn, abdominal pain, nausea, vomiting, diarrhea and constipation GU: Denies hematuria, incontinence, dysuria  Musculoskeletal: Denies arthritis  Skin: Denies suspicious skin lesions Neurologic: Denies focal weakness or numbness, change in vision  Past Medical History  Diagnosis Date  . Sickle cell anemia (HCC)   . Anemia     Past Surgical History  Procedure Laterality Date  . Tubal ligation    . Cholecystectomy    . Cesarean section    . Port a cath placement Right     about 6-7 years ago  . Cholecystectomy  2000    Allergies  Allergen Reactions  . Ultram [Tramadol] Other (See Comments)    seizures  . Zofran [Ondansetron Hcl] Nausea And Vomiting  . Morphine And Related Hives, Rash and Other (See  Comments)    Shaking Tolerates Percocet, Norco, and buprenorphine  . Tape Rash    Family History  Problem Relation Age of Onset  . Sickle cell anemia Other   . Sickle cell trait Father   . Sickle cell trait Mother       Prior to Admission medications   Medication Sig Start Date End Date Taking? Authorizing Provider  DULoxetine (CYMBALTA) 60 MG capsule Take 1 capsule (60 mg total) by mouth daily. 01/08/15  Yes Quentin Angst, MD  folic acid (FOLVITE) 1 MG tablet Take 1 tablet (1 mg total) by mouth daily. 01/08/15  Yes Quentin Angst, MD  hydroxyurea (HYDREA) 500 MG capsule Take 2 capsules (1,000 mg total) by mouth daily. May take with food to minimize GI side effects. 11/09/14  Yes [name], FNP  ibuprofen (ADVIL,MOTRIN) 200 MG tablet Take 200 mg by mouth every 6 (six) hours as needed for moderate pain.   Yes Historical Provider, MD  morphine (MS CONTIN) 30 MG 12 hr tablet Take 1 tablet (30 mg total) by mouth every 12 (twelve) hours. 06/11/15  Yes Quentin Angst, MD  oxyCODONE-acetaminophen (PERCOCET) 10-325 MG tablet Take 1 tablet by mouth every 6 (six) hours as needed for pain. 06/11/15  Yes Quentin Angst, MD  potassium chloride SA (K-DUR,KLOR-CON) 20 MEQ tablet Take 1 tablet (20 mEq total) by mouth daily. 01/08/15  Yes Quentin Angst, MD  Topiramate ER 100 MG CP24 Take 100 mg by mouth at bedtime.   Yes Historical Provider, MD  Cholecalciferol (VITAMIN D3) 5000 UNITS  CAPS Take 1 capsule (5,000 Units total) by mouth daily. Patient not taking: Reported on 03/07/2015 01/08/15   Quentin Angstlugbemiga E Dayna Geurts, MD  gabapentin (NEURONTIN) 300 MG capsule Take 1 capsule (300 mg total) by mouth 3 (three) times daily. 02/14/14   Altha HarmMichelle A [name], MD     Physical Exam: Filed Vitals:   06/25/15 1131  BP: 111/74  Pulse: 95  Temp: 98.8 F (37.1 C)  TempSrc: Oral  Resp: 20  Height: 5' (1.524 m)  Weight: 122 lb (55.339 kg)  SpO2: 100%    General: Alert, awake, afebrile,  anicteric, not in obvious distress HEENT: Normocephalic and Atraumatic, Mucous membranes pink                PERRLA; EOM intact; No scleral icterus,                 Nares: Patent, Oropharynx: Clear, Fair Dentition                 Neck: FROM, no cervical lymphadenopathy, thyromegaly, carotid bruit or JVD;  CHEST WALL: No tenderness  CHEST: Normal respiration, clear to auscultation bilaterally  HEART: Regular rate and rhythm; no murmurs rubs or gallops  BACK: No kyphosis or scoliosis; no CVA tenderness  ABDOMEN: Positive Bowel Sounds, soft, non-tender; no masses, no organomegaly Rectal Exam: deferred EXTREMITIES: No cyanosis, clubbing, or edema SKIN:  no rash or ulceration  CNS: Alert and Oriented x 4, Nonfocal exam, CN 2-12 intact  Labs on Admission:  Basic Metabolic Panel: No results for input(s): NA, K, CL, CO2, GLUCOSE, BUN, CREATININE, CALCIUM, MG, PHOS in the last 168 hours. Liver Function Tests: No results for input(s): AST, ALT, ALKPHOS, BILITOT, PROT, ALBUMIN in the last 168 hours. No results for input(s): LIPASE, AMYLASE in the last 168 hours. No results for input(s): AMMONIA in the last 168 hours. CBC: No results for input(s): WBC, NEUTROABS, HGB, HCT, MCV, PLT in the last 168 hours. Cardiac Enzymes: No results for input(s): CKTOTAL, CKMB, CKMBINDEX, TROPONINI in the last 168 hours.  BNP (last 3 results) No results for input(s): BNP in the last 8760 hours.  ProBNP (last 3 results) No results for input(s): PROBNP in the last 8760 hours.  CBG: No results for input(s): GLUCAP in the last 168 hours.   Assessment/Plan Active Problems:   Sickle cell anemia with pain (HCC)   Admits to the Day Hospital  IVF D5 .45% Saline @ 125 mls/hour  Weight based Dilaudid PCA started within 30 minutes of admission  IV Toradol Q6H  Monitor vitals very closely, Re-evaluate pain scale every hour  Oxygen by Babb 2 L  Patient will be re-evaluated for pain in the context of function  and relationship to baseline as care progresses.  If no significant relieve from pain (remains above 5/10) will transfer patient to inpatient services for further evaluation and management  Code Status: Full  Family Communication: None  DVT Prophylaxis: Ambulate as tolerated   Time spent: 35 Minutes  Frederico Gerling, MD, MHA, FACP, FAAP, CPE  If 7PM-7AM, please contact night-coverage [email or web address] 06/25/2015, 11:35 AM

## 2015-06-25 NOTE — Progress Notes (Signed)
[facility] legs, and arms. Rating pain 9/10. Treated with IV fluids, IV Toradol, IV PCA Dilaudid. Rates pain 6/10 at time of discharge and is comfortable with this pain level. Went over discharge instructions and copy given to patient. Understands instructions. Alert, oriented and ambulatory at time of discharge. Discharged to home.

## 2015-06-25 NOTE — Discharge Summary (Signed)
Physician Discharge Summary  Brittany Foster [id number]:[id number] DOB: 02/10/1986 DOA: 06/25/2015  PCP: Jeanann LewandowskyJEGEDE, Iris Hairston, MD  Admit date: 06/25/2015  Discharge date: 06/25/2015  Time spent: 30 minutes  Discharge Diagnoses:  Active Problems:   Sickle cell anemia with pain [facility](HCC)   Discharge Condition: Stable  Diet recommendation: Regular  Filed Weights   06/25/15 1131  Weight: 122 lb (55.339 kg)    History of present illness:   Brittany Foster is a 30 y.o. female with history of sickle cell anemia, HbSS presents initially to the clinic for her routine follow up but with  Complaints of generalize body pain. Pain intensity 10/10, has not been controlled by home medications.She last had Percocet 10-325 mg and MS Contin this morning with no sustained relief. She states that she is taking all other medications consistently. she says she has been going through a lot of stress, she says she has no help at home, she lives alone with her daughter, being a single mother, has no job and no means of transportation. Her family is in [place], [place] but she is not willing to relocate to [place]. She denies headache, chest pain, shortness of breath, nausea, vomiting, dysuria, or diarrhea.   Hospital Course:   Brittany Foster was admitted to the day hospital with sickle cell painful crisis. Patient was treated with weight based IV Dilaudid PCA, IV Toradol as well as IV fluids. Abbey showed significant improvement symptomatically and pain went down from 10 to 5 out of 10 at the time of discharge. Brittany Foster will follow-up in the clinic as previously scheduled, continue with home medications as per prior to admission.  I discussed at length with patient's social worker/CM [facility], the social worker/case manager told me she discussed all the stressors with patient in the past and they provide her transportation to appointments. She is willing to help patient get a referral to  vocational rehabilitation center and to provide resources for employment opportunities. I informed patient to call her social worker tomorrow and have another discussion concerning all her stressors. Patient verbalized understanding and was appreciative.  Discharge Exam: Filed Vitals:   06/25/15 1608 06/25/15 1634  BP: 101/53   Pulse: 87   Temp:    Resp: 14 17    General appearance: alert, cooperative and no distress Eyes: conjunctivae/corneas clear. PERRL, EOM's intact. Fundi benign. Neck: no adenopathy, no carotid bruit, no JVD, supple, symmetrical, trachea midline and thyroid not enlarged, symmetric, no tenderness/mass/nodules Back: symmetric, no curvature. ROM normal. No CVA tenderness. Resp: clear to auscultation bilaterally Chest wall: no tenderness Cardio: regular rate and rhythm, S1, S2 normal, no murmur, click, rub or gallop GI: soft, non-tender; bowel sounds normal; no masses, no organomegaly Extremities: extremities normal, atraumatic, no cyanosis or edema Pulses: 2+ and symmetric Skin: Skin color, texture, turgor normal. No rashes or lesions Neurologic: Grossly normal  Discharge Instructions  We discussed the need for good hydration, monitoring of hydration status, avoidance of heat, cold, stress, and infection triggers. We discussed the need to be compliant with taking Hydrea. [name] was reminded of the need to seek medical attention of any symptoms of bleeding, anemia, or infection occurs.   Current Discharge Medication List    CONTINUE these medications which have NOT CHANGED   Details  DULoxetine (CYMBALTA) 60 MG capsule Take 1 capsule (60 mg total) by mouth daily. Qty: 30 capsule, Refills: 3   Associated Diagnoses: Chronic pain syndrome    folic acid (FOLVITE) 1 MG tablet Take  1 tablet (1 mg total) by mouth daily. Qty: 90 tablet, Refills: 11   Associated Diagnoses: Hb-SS disease without crisis (HCC)    hydroxyurea (HYDREA) 500 MG capsule Take 2 capsules  (1,000 mg total) by mouth daily. May take with food to minimize GI side effects. 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    morphine (MS CONTIN) 30 MG 12 hr tablet Take 1 tablet (30 mg total) by mouth every 12 (twelve) hours. Qty: 30 tablet, Refills: 0   Associated Diagnoses: Hb-SS disease without crisis (HCC)    oxyCODONE-acetaminophen (PERCOCET) 10-325 MG tablet Take 1 tablet by mouth every 6 (six) hours as needed for pain. Qty: 60 tablet, Refills: 0   Associated Diagnoses: Hb-SS disease without crisis (HCC)    potassium chloride SA (K-DUR,KLOR-CON) 20 MEQ tablet Take 1 tablet (20 mEq total) by mouth daily. Qty: 30 tablet, Refills: 2   Associated Diagnoses: Hypokalemia    Topiramate ER 100 MG CP24 Take 100 mg by mouth at bedtime.    Cholecalciferol (VITAMIN D3) 5000 UNITS CAPS Take 1 capsule (5,000 Units total) by mouth daily. Qty: 30 capsule, Refills: 5   Associated Diagnoses: Vitamin D deficiency    gabapentin (NEURONTIN) 300 MG capsule Take 1 capsule (300 mg total) by mouth 3 (three) times daily. Qty: 90 capsule, Refills: 2       Allergies  Allergen Reactions  . Ultram [Tramadol] Other (See Comments)    seizures  . Zofran [Ondansetron Hcl] Nausea And Vomiting  . Morphine And Related Hives, Rash and Other (See Comments)    Shaking Tolerates Percocet, Norco, and buprenorphine  . Tape Rash     Significant Diagnostic Studies: No results found.  Signed:  Jeanann Lewandowsky MD, MHA, FACP, FAAP, CPE   06/25/2015, 4:38 PM

## 2015-06-25 NOTE — Discharge Instructions (Signed)
Sickle Cell Anemia, Adult °Sickle cell anemia is a condition in which red blood cells have an abnormal "sickle" shape. This abnormal shape shortens the cells' life span, which results in a lower than normal concentration of red blood cells in the blood. The sickle shape also causes the cells to clump together and block free blood flow through the blood vessels. As a result, the tissues and organs of the body do not receive enough oxygen. Sickle cell anemia causes organ damage and pain and increases the risk of infection. °CAUSES  °Sickle cell anemia is a genetic disorder. Those who receive two copies of the gene have the condition, and those who receive one copy have the trait. °RISK FACTORS °The sickle cell gene is most common in people whose families originated in [place]. Other areas of the globe where sickle cell trait occurs include the Mediterranean, [place] and [place], the [place], and the [place].  °SIGNS AND SYMPTOMS °· Pain, especially in the extremities, back, chest, or abdomen (common). The pain may start suddenly or may develop following an illness, especially if there is dehydration. Pain can also occur due to overexertion or exposure to extreme temperature changes. °· Frequent severe bacterial infections, especially certain types of pneumonia and meningitis. °· Pain and swelling in the hands and feet. °· Decreased activity.   °· Loss of appetite.   °· Change in behavior. °· Headaches. °· Seizures. °· Shortness of breath or difficulty breathing. °· Vision changes. °· Skin ulcers. °Those with the trait may not have symptoms or they may have mild symptoms.  °DIAGNOSIS  °Sickle cell anemia is diagnosed with blood tests that demonstrate the genetic trait. It is often diagnosed during the newborn period, due to mandatory testing nationwide. A variety of blood tests, X-rays, CT scans, MRI scans, ultrasounds, and lung function tests may also be done to monitor the condition. °TREATMENT  °Sickle  cell anemia may be treated with: °· Medicines. You may be given pain medicines, antibiotic medicines (to treat and prevent infections) or medicines to increase the production of certain types of hemoglobin. °· Fluids. °· Oxygen. °· Blood transfusions. °HOME CARE INSTRUCTIONS  °· Drink enough fluid to keep your urine clear or pale yellow. Increase your fluid intake in hot weather and during exercise. °· Do not smoke. Smoking lowers oxygen levels in the blood.   °· Only take over-the-counter or prescription medicines for pain, fever, or discomfort as directed by your health care provider. °· Take antibiotics as directed by your health care provider. Make sure you finish them it even if you start to feel better.   °· Take supplements as directed by your health care provider.   °· Consider wearing a medical alert bracelet. This tells anyone caring for you in an emergency of your condition.   °· When traveling, keep your medical information, health care provider's names, and the medicines you take with you at all times.   °· If you develop a fever, do not take medicines to reduce the fever right away. This could cover up a problem that is developing. Notify your health care provider. °· Keep all follow-up appointments with your health care provider. Sickle cell anemia requires regular medical care. °SEEK MEDICAL CARE IF: ° You have a fever. °SEEK IMMEDIATE MEDICAL CARE IF:  °· You feel dizzy or faint.   °· You have new abdominal pain, especially on the left side near the stomach area.   °· You develop a persistent, often uncomfortable and painful penile erection (priapism). If this is not treated immediately it   will lead to impotence.   °· You have numbness your arms or legs or you have a hard time moving them.   °· You have a hard time with speech.   °· You have a fever or persistent symptoms for more than 2-3 days.   °· You have a fever and your symptoms suddenly get worse.   °· You have signs or symptoms of infection.  These include:   °¨ Chills.   °¨ Abnormal tiredness (lethargy).   °¨ Irritability.   °¨ Poor eating.   °¨ Vomiting.   °· You develop pain that is not helped with medicine.   °· You develop shortness of breath. °· You have pain in your chest.   °· You are coughing up pus-like or bloody sputum.   °· You develop a stiff neck. °· Your feet or hands swell or have pain. °· Your abdomen appears bloated. °· You develop joint pain. °MAKE SURE YOU: °· Understand these instructions. °  °This information is not intended to replace advice given to you by your health care provider. Make sure you discuss any questions you have with your health care provider. °  °Document Released: 05/27/2005 Document Revised: 03/09/2014 Document Reviewed: 09/28/2012 °Elsevier Interactive Patient Education ©2016 [employer] ° °Sickle Cell Anemia, Adult °Sickle cell anemia is a condition in which red blood cells have an abnormal "sickle" shape. This abnormal shape shortens the cells' life span, which results in a lower than normal concentration of red blood cells in the blood. The sickle shape also causes the cells to clump together and block free blood flow through the blood vessels. As a result, the tissues and organs of the body do not receive enough oxygen. Sickle cell anemia causes organ damage and pain and increases the risk of infection. °CAUSES  °Sickle cell anemia is a genetic disorder. Those who receive two copies of the gene have the condition, and those who receive one copy have the trait. °RISK FACTORS °The sickle cell gene is most common in people whose families originated in [place]. Other areas of the globe where sickle cell trait occurs include the Mediterranean, [place], the [place], and the [place].  °SIGNS AND SYMPTOMS °· Pain, especially in the extremities, back, chest, or abdomen (common). The pain may start suddenly or may develop following an illness, especially if there is dehydration. Pain can also  occur due to overexertion or exposure to extreme temperature changes. °· Frequent severe bacterial infections, especially certain types of pneumonia and meningitis. °· Pain and swelling in the hands and feet. °· Decreased activity.   °· Loss of appetite.   °· Change in behavior. °· Headaches. °· Seizures. °· Shortness of breath or difficulty breathing. °· Vision changes. °· Skin ulcers. °Those with the trait may not have symptoms or they may have mild symptoms.  °DIAGNOSIS  °Sickle cell anemia is diagnosed with blood tests that demonstrate the genetic trait. It is often diagnosed during the newborn period, due to mandatory testing nationwide. A variety of blood tests, X-rays, CT scans, MRI scans, ultrasounds, and lung function tests may also be done to monitor the condition. °TREATMENT  °Sickle cell anemia may be treated with: °· Medicines. You may be given pain medicines, antibiotic medicines (to treat and prevent infections) or medicines to increase the production of certain types of hemoglobin. °· Fluids. °· Oxygen. °· Blood transfusions. °HOME CARE INSTRUCTIONS  °· Drink enough fluid to keep your urine clear or pale yellow. Increase your fluid intake in hot weather and during exercise. °· Do not smoke. Smoking lowers oxygen   levels in the blood.   °· Only take over-the-counter or prescription medicines for pain, fever, or discomfort as directed by your health care provider. °· Take antibiotics as directed by your health care provider. Make sure you finish them it even if you start to feel better.   °· Take supplements as directed by your health care provider.   °· Consider wearing a medical alert bracelet. This tells anyone caring for you in an emergency of your condition.   °· When traveling, keep your medical information, health care provider's names, and the medicines you take with you at all times.   °· If you develop a fever, do not take medicines to reduce the fever right away. This could cover up a problem  that is developing. Notify your health care provider. °· Keep all follow-up appointments with your health care provider. Sickle cell anemia requires regular medical care. °SEEK MEDICAL CARE IF: ° You have a fever. °SEEK IMMEDIATE MEDICAL CARE IF:  °· You feel dizzy or faint.   °· You have new abdominal pain, especially on the left side near the stomach area.   °· You develop a persistent, often uncomfortable and painful penile erection (priapism). If this is not treated immediately it will lead to impotence.   °· You have numbness your arms or legs or you have a hard time moving them.   °· You have a hard time with speech.   °· You have a fever or persistent symptoms for more than 2-3 days.   °· You have a fever and your symptoms suddenly get worse.   °· You have signs or symptoms of infection. These include:   °¨ Chills.   °¨ Abnormal tiredness (lethargy).   °¨ Irritability.   °¨ Poor eating.   °¨ Vomiting.   °· You develop pain that is not helped with medicine.   °· You develop shortness of breath. °· You have pain in your chest.   °· You are coughing up pus-like or bloody sputum.   °· You develop a stiff neck. °· Your feet or hands swell or have pain. °· Your abdomen appears bloated. °· You develop joint pain. °MAKE SURE YOU: °· Understand these instructions. °  °This information is not intended to replace advice given to you by your health care provider. Make sure you discuss any questions you have with your health care provider. °  °Document Released: 05/27/2005 Document Revised: 03/09/2014 Document Reviewed: 09/28/2012 °Elsevier Interactive Patient Education ©2016 [employer] ° °Sickle Cell Anemia, Adult °Sickle cell anemia is a condition in which red blood cells have an abnormal "sickle" shape. This abnormal shape shortens the cells' life span, which results in a lower than normal concentration of red blood cells in the blood. The sickle shape also causes the cells to clump together and block free blood  flow through the blood vessels. As a result, the tissues and organs of the body do not receive enough oxygen. Sickle cell anemia causes organ damage and pain and increases the risk of infection. °CAUSES  °Sickle cell anemia is a genetic disorder. Those who receive two copies of the gene have the condition, and those who receive one copy have the trait. °RISK FACTORS °The sickle cell gene is most common in people whose families originated in [place]. Other areas of the globe where sickle cell trait occurs include the Mediterranean, [place], the [place], and the [place].  °SIGNS AND SYMPTOMS °· Pain, especially in the extremities, back, chest, or abdomen (common). The pain may start suddenly or may develop following an illness, especially if there is dehydration. Pain   can also occur due to overexertion or exposure to extreme temperature changes. °· Frequent severe bacterial infections, especially certain types of pneumonia and meningitis. °· Pain and swelling in the hands and feet. °· Decreased activity.   °· Loss of appetite.   °· Change in behavior. °· Headaches. °· Seizures. °· Shortness of breath or difficulty breathing. °· Vision changes. °· Skin ulcers. °Those with the trait may not have symptoms or they may have mild symptoms.  °DIAGNOSIS  °Sickle cell anemia is diagnosed with blood tests that demonstrate the genetic trait. It is often diagnosed during the newborn period, due to mandatory testing nationwide. A variety of blood tests, X-rays, CT scans, MRI scans, ultrasounds, and lung function tests may also be done to monitor the condition. °TREATMENT  °Sickle cell anemia may be treated with: °· Medicines. You may be given pain medicines, antibiotic medicines (to treat and prevent infections) or medicines to increase the production of certain types of hemoglobin. °· Fluids. °· Oxygen. °· Blood transfusions. °HOME CARE INSTRUCTIONS  °· Drink enough fluid to keep your urine clear or pale  yellow. Increase your fluid intake in hot weather and during exercise. °· Do not smoke. Smoking lowers oxygen levels in the blood.   °· Only take over-the-counter or prescription medicines for pain, fever, or discomfort as directed by your health care provider. °· Take antibiotics as directed by your health care provider. Make sure you finish them it even if you start to feel better.   °· Take supplements as directed by your health care provider.   °· Consider wearing a medical alert bracelet. This tells anyone caring for you in an emergency of your condition.   °· When traveling, keep your medical information, health care provider's names, and the medicines you take with you at all times.   °· If you develop a fever, do not take medicines to reduce the fever right away. This could cover up a problem that is developing. Notify your health care provider. °· Keep all follow-up appointments with your health care provider. Sickle cell anemia requires regular medical care. °SEEK MEDICAL CARE IF: ° You have a fever. °SEEK IMMEDIATE MEDICAL CARE IF:  °· You feel dizzy or faint.   °· You have new abdominal pain, especially on the left side near the stomach area.   °· You develop a persistent, often uncomfortable and painful penile erection (priapism). If this is not treated immediately it will lead to impotence.   °· You have numbness your arms or legs or you have a hard time moving them.   °· You have a hard time with speech.   °· You have a fever or persistent symptoms for more than 2-3 days.   °· You have a fever and your symptoms suddenly get worse.   °· You have signs or symptoms of infection. These include:   °¨ Chills.   °¨ Abnormal tiredness (lethargy).   °¨ Irritability.   °¨ Poor eating.   °¨ Vomiting.   °· You develop pain that is not helped with medicine.   °· You develop shortness of breath. °· You have pain in your chest.   °· You are coughing up pus-like or bloody sputum.   °· You develop a stiff  neck. °· Your feet or hands swell or have pain. °· Your abdomen appears bloated. °· You develop joint pain. °MAKE SURE YOU: °· Understand these instructions. °  °This information is not intended to replace advice given to you by your health care provider. Make sure you discuss any questions you have with your health care provider. °  °Document Released: 05/27/2005 Document   Revised: 03/09/2014 Document Reviewed: 09/28/2012 °Elsevier Interactive Patient Education ©2016 [employer] Inc. ° °

## 2015-06-26 ENCOUNTER — Other Ambulatory Visit: Payer: Self-pay | Admitting: Family Medicine

## 2015-07-05 ENCOUNTER — Telehealth (HOSPITAL_COMMUNITY): Payer: Self-pay | Admitting: *Deleted

## 2015-07-05 ENCOUNTER — Non-Acute Institutional Stay (HOSPITAL_COMMUNITY)
Admission: AD | Admit: 2015-07-05 | Discharge: 2015-07-05 | Disposition: A | Discharge status: Home / Self Care | Payer: Medicaid Other | Source: Ambulatory Visit | Attending: Internal Medicine | Admitting: Internal Medicine

## 2015-07-05 ENCOUNTER — Encounter (HOSPITAL_COMMUNITY): Payer: Self-pay | Admitting: *Deleted

## 2015-07-05 ENCOUNTER — Other Ambulatory Visit: Payer: Self-pay | Admitting: Internal Medicine

## 2015-07-05 DIAGNOSIS — D571 Sickle-cell disease without crisis: Secondary | ICD-10-CM

## 2015-07-05 DIAGNOSIS — Z791 Long term (current) use of non-steroidal anti-inflammatories (NSAID): Secondary | ICD-10-CM | POA: Insufficient documentation

## 2015-07-05 DIAGNOSIS — D5700 Hb-SS disease with crisis, unspecified: Secondary | ICD-10-CM | POA: Diagnosis not present

## 2015-07-05 DIAGNOSIS — R52 Pain, unspecified: Secondary | ICD-10-CM | POA: Diagnosis present

## 2015-07-05 DIAGNOSIS — Z79891 Long term (current) use of opiate analgesic: Secondary | ICD-10-CM | POA: Insufficient documentation

## 2015-07-05 DIAGNOSIS — Z79899 Other long term (current) drug therapy: Secondary | ICD-10-CM | POA: Diagnosis not present

## 2015-07-05 LAB — CBC WITH DIFFERENTIAL/PLATELET
Basophils Absolute: 0 10*3/uL (ref 0.0–0.1)
Basophils Relative: 0 %
EOS PCT: 1 %
Eosinophils Absolute: 0.1 10*3/uL (ref 0.0–0.7)
HCT: 25.2 % — ABNORMAL LOW (ref 36.0–46.0)
Hemoglobin: 8.8 g/dL — ABNORMAL LOW (ref 12.0–15.0)
LYMPHS ABS: 1.6 10*3/uL (ref 0.7–4.0)
Lymphocytes Relative: 15 %
MCH: 32.7 pg (ref 26.0–34.0)
MCHC: 34.9 g/dL (ref 30.0–36.0)
MCV: 93.7 fL (ref 78.0–100.0)
MONO ABS: 0.6 10*3/uL (ref 0.1–1.0)
Monocytes Relative: 6 %
NEUTROS ABS: 8.4 10*3/uL — AB (ref 1.7–7.7)
Neutrophils Relative %: 78 %
PLATELETS: 406 10*3/uL — AB (ref 150–400)
RBC: 2.69 MIL/uL — AB (ref 3.87–5.11)
RDW: 27.5 % — AB (ref 11.5–15.5)
WBC: 10.7 10*3/uL — AB (ref 4.0–10.5)

## 2015-07-05 LAB — URINALYSIS, ROUTINE W REFLEX MICROSCOPIC
Bilirubin Urine: NEGATIVE
GLUCOSE, UA: NEGATIVE mg/dL
HGB URINE DIPSTICK: NEGATIVE
KETONES UR: NEGATIVE mg/dL
Nitrite: NEGATIVE
PH: 7.5 (ref 5.0–8.0)
Protein, ur: NEGATIVE mg/dL
Specific Gravity, Urine: 1.014 (ref 1.005–1.030)

## 2015-07-05 LAB — COMPREHENSIVE METABOLIC PANEL
ALK PHOS: 85 U/L (ref 38–126)
ALT: 28 U/L (ref 14–54)
AST: 42 U/L — AB (ref 15–41)
Albumin: 4.3 g/dL (ref 3.5–5.0)
Anion gap: 10 (ref 5–15)
CALCIUM: 9.4 mg/dL (ref 8.9–10.3)
CHLORIDE: 109 mmol/L (ref 101–111)
CO2: 23 mmol/L (ref 22–32)
CREATININE: 0.44 mg/dL (ref 0.44–1.00)
Glucose, Bld: 111 mg/dL — ABNORMAL HIGH (ref 65–99)
Potassium: 3 mmol/L — ABNORMAL LOW (ref 3.5–5.1)
Sodium: 142 mmol/L (ref 135–145)
Total Bilirubin: 2.6 mg/dL — ABNORMAL HIGH (ref 0.3–1.2)
Total Protein: 7.5 g/dL (ref 6.5–8.1)

## 2015-07-05 LAB — URINE MICROSCOPIC-ADD ON

## 2015-07-05 LAB — RETICULOCYTES
RBC.: 2.69 MIL/uL — AB (ref 3.87–5.11)
RETIC CT PCT: 10.4 % — AB (ref 0.4–3.1)
Retic Count, Absolute: 279.8 10*3/uL — ABNORMAL HIGH (ref 19.0–186.0)

## 2015-07-05 MED ORDER — PROMETHAZINE HCL 12.5 MG RE SUPP
12.5000 mg | RECTAL | Status: DC | PRN
Start: 2015-07-05 — End: 2015-07-05
  Filled 2015-07-05: qty 2

## 2015-07-05 MED ORDER — MORPHINE SULFATE ER 30 MG PO TBCR: 30.0000 mg | EXTENDED_RELEASE_TABLET | Freq: Two times a day (BID) | ORAL | Status: DC

## 2015-07-05 MED ORDER — OXYCODONE-ACETAMINOPHEN 10-325 MG PO TABS: 1.0000 | ORAL_TABLET | Freq: Four times a day (QID) | ORAL | Status: DC | PRN

## 2015-07-05 MED ORDER — SODIUM CHLORIDE 0.9% FLUSH: 9.0000 mL | INTRAVENOUS | Status: DC | PRN

## 2015-07-05 MED ORDER — KETOROLAC TROMETHAMINE 30 MG/ML IJ SOLN
30.0000 mg | Freq: Four times a day (QID) | INTRAMUSCULAR | Status: DC
  Administered 2015-07-05: 30 mg via INTRAVENOUS
  Filled 2015-07-05: qty 1

## 2015-07-05 MED ORDER — PROMETHAZINE HCL 25 MG PO TABS: 12.5000 mg | ORAL_TABLET | ORAL | Status: DC | PRN

## 2015-07-05 MED ORDER — SODIUM CHLORIDE 0.9 % IV SOLN
12.5000 mg | Freq: Four times a day (QID) | INTRAVENOUS | Status: DC | PRN
  Administered 2015-07-05: 12.5 mg via INTRAVENOUS
  Filled 2015-07-05 (×3): qty 0.25

## 2015-07-05 MED ORDER — FOLIC ACID 1 MG PO TABS
1.0000 mg | ORAL_TABLET | Freq: Every day | ORAL | Status: DC
  Administered 2015-07-05: 1 mg via ORAL
  Filled 2015-07-05: qty 1

## 2015-07-05 MED ORDER — DIPHENHYDRAMINE HCL 12.5 MG/5ML PO ELIX: 12.5000 mg | ORAL_SOLUTION | Freq: Four times a day (QID) | ORAL | Status: DC | PRN

## 2015-07-05 MED ORDER — NALOXONE HCL 0.4 MG/ML IJ SOLN: 0.4000 mg | INTRAMUSCULAR | Status: DC | PRN

## 2015-07-05 MED ORDER — HYDROMORPHONE 1 MG/ML IV SOLN
INTRAVENOUS | Status: DC
  Administered 2015-07-05: 10:00:00 via INTRAVENOUS
  Administered 2015-07-05: 10.6 mg via INTRAVENOUS
  Filled 2015-07-05: qty 25

## 2015-07-05 MED ORDER — DEXTROSE-NACL 5-0.45 % IV SOLN
INTRAVENOUS | Status: DC
  Administered 2015-07-05: 10:00:00 via INTRAVENOUS

## 2015-07-05 NOTE — Progress Notes (Signed)
Patient ID: Brittany Foster, female   DOB: 05/29/1985, 30 y.o.   MRN: [id number] Admitted to [facility] for treatment of acute pain 9/10, in back, arms and legs. Treated with IV fluids, IV Toradol and PCA Dilaudid. Pain level down to pain goal of 6 at time of discharge. Went over discharge instructions and copy of instructions given to patient. Alert, oriented and ambulatory at time of discharge. Discharged to home.

## 2015-07-05 NOTE — Telephone Encounter (Signed)
Returned call to patient after speaking with Dr. Hyman HopesJegede about her pain. Dr. Hyman HopesJegede [facility] and she was notified and will come.

## 2015-07-05 NOTE — H&P (Signed)
[facility] History and Physical  Brittany Foster ZOX:[id number] DOB: 04/19/1985 DOA: 07/05/2015  PCP: Jeanann LewandowskyJEGEDE, Florina Glas, MD   Chief Complaint: Generalized body pain for one week  HPI: Brittany Foster is a 30 y.o. female with history of of sickle cell anemia, who [facility] withcomplaints of generalize body pain. Pain intensity 9/10, has not been controlled by home medications.She took Percocet 10-325 mg and MS Contin this morning with no sustained relief. She agrees that she is taking all other medications but inconsistently.She was [place] to a hospital near [place] over last weekend and she left AMA 3 days ago. She has been cycling around healthcare facilities for the same complaint seeking for IV pain meds. She had confessed that she is in a lot of domestic stress including loneliness and the fact that she is a single mother of a 31 years old girl. 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. She denies headache, chest pain, shortness of breath, nausea, vomiting, dysuria, or diarrhea. No fever. She is been scheduled for 7 teeth extraction and implant next week. She said she may need to double up on her pain pills after surgery.  Systemic Review: General: The patient denies anorexia, fever, weight loss Cardiac: Denies chest pain, syncope, palpitations, pedal edema  Respiratory: Denies cough, shortness of breath, wheezing GI: Denies severe indigestion/heartburn, abdominal pain, nausea, vomiting, diarrhea and constipation GU: Denies hematuria, incontinence, dysuria  Musculoskeletal: Denies arthritis  Skin: Denies suspicious skin lesions Neurologic: Denies focal weakness or numbness, change in vision  Past Medical History  Diagnosis Date  . Sickle cell anemia (HCC)   . Anemia     Past Surgical History  Procedure Laterality Date  . Tubal ligation    . Cholecystectomy    . Cesarean section    . Port a cath  placement Right     about 6-7 years ago  . Cholecystectomy  2000    Allergies  Allergen Reactions  . Ultram [Tramadol] Other (See Comments)    seizures  . Zofran [Ondansetron Hcl] Nausea And Vomiting  . Morphine And Related Hives, Rash and Other (See Comments)    Shaking Tolerates Percocet, Norco, and buprenorphine  . Tape Rash    Family History  Problem Relation Age of Onset  . Sickle cell anemia Other   . Sickle cell trait Father   . Sickle cell trait Mother       Prior to Admission medications   Medication Sig Start Date End Date Taking? Authorizing Provider  DULoxetine (CYMBALTA) 60 MG capsule Take 1 capsule (60 mg total) by mouth daily. 01/08/15  Yes Quentin Angstlugbemiga E Toriann Spadoni, MD  folic acid (FOLVITE) 1 MG tablet Take 1 tablet (1 mg total) by mouth daily. 01/08/15  Yes Quentin Angstlugbemiga E Maleeya Peterkin, MD  hydroxyurea (HYDREA) 500 MG capsule TAKE 2 CAPSULES BY MOUTH DAILY. MAY TAKE WITH FOOD TO MINIMIZE GI SIDE EFFECTS. 06/26/15  Yes Quentin Angstlugbemiga E Zeeva Courser, MD  ibuprofen (ADVIL,MOTRIN) 200 MG tablet Take 200 mg by mouth every 6 (six) hours as needed for moderate pain.   Yes Historical Provider, MD  morphine (MS CONTIN) 30 MG 12 hr tablet Take 1 tablet (30 mg total) by mouth every 12 (twelve) hours. 06/25/15  Yes Quentin Angstlugbemiga E Raniah Karan, MD  oxyCODONE-acetaminophen (PERCOCET) 10-325 MG tablet Take 1 tablet by mouth every 6 (six) hours as needed for pain. 06/25/15  Yes Quentin Angstlugbemiga E Deyton Ellenbecker, MD  potassium chloride SA (  K-DUR,KLOR-CON) 20 MEQ tablet Take 1 tablet (20 mEq total) by mouth daily. 01/08/15  Yes Quentin Angst, MD  potassium chloride SA (K-DUR,KLOR-CON) 20 MEQ tablet TAKE 1 TABLET BY MOUTH DAILY. 06/26/15  Yes Quentin Angst, MD  Cholecalciferol (VITAMIN D3) 5000 UNITS CAPS Take 1 capsule (5,000 Units total) by mouth daily. 01/08/15   Quentin Angst, MD  gabapentin (NEURONTIN) 300 MG capsule Take 1 capsule (300 mg total) by mouth 3 (three) times daily. 02/14/14   Altha Harm, MD   Topiramate ER 100 MG CP24 Take 100 mg by mouth at bedtime.    Historical Provider, MD     Physical Exam: Filed Vitals:   07/05/15 0937  BP: 110/65  Pulse: 82  Temp: 99 F (37.2 C)  TempSrc: Oral  Resp: 16  Height: 5' (1.524 m)  Weight: 124 lb (56.246 kg)  SpO2: 99%    General: Alert, awake, afebrile, anicteric, not in obvious distress HEENT: Normocephalic and Atraumatic, Mucous membranes pink                PERRLA; EOM intact; No scleral icterus,                 Nares: Patent, Oropharynx: Clear, Fair Dentition                 Neck: FROM, no cervical lymphadenopathy, thyromegaly, carotid bruit or JVD;  CHEST WALL: No tenderness  CHEST: Normal respiration, clear to auscultation bilaterally  HEART: Regular rate and rhythm; no murmurs rubs or gallops  BACK: No kyphosis or scoliosis; no CVA tenderness  ABDOMEN: Positive Bowel Sounds, soft, non-tender; no masses, no organomegaly Rectal Exam: deferred EXTREMITIES: No cyanosis, clubbing, or edema SKIN:  no rash or ulceration  CNS: Alert and Oriented x 4, Nonfocal exam, CN 2-12 intact  Labs on Admission:  Basic Metabolic Panel: No results for input(s): NA, K, CL, CO2, GLUCOSE, BUN, CREATININE, CALCIUM, MG, PHOS in the last 168 hours. Liver Function Tests: No results for input(s): AST, ALT, ALKPHOS, BILITOT, PROT, ALBUMIN in the last 168 hours. No results for input(s): LIPASE, AMYLASE in the last 168 hours. No results for input(s): AMMONIA in the last 168 hours. CBC: No results for input(s): WBC, NEUTROABS, HGB, HCT, MCV, PLT in the last 168 hours. Cardiac Enzymes: No results for input(s): CKTOTAL, CKMB, CKMBINDEX, TROPONINI in the last 168 hours.  BNP (last 3 results) No results for input(s): BNP in the last 8760 hours.  ProBNP (last 3 results) No results for input(s): PROBNP in the last 8760 hours.  CBG: No results for input(s): GLUCAP in the last 168 hours.  Assessment/Plan Active Problems:   Sickle cell anemia with  pain (HCC)   [facility]  IVF D5 .45% Saline @ 150 mls/hour  Weight based Dilaudid PCA started within 30 minutes of admission  IV Toradol 30 mg Q 6 H  Monitor vitals very closely, Re-evaluate pain scale every hour  Oxygen by [place] 2L  Patient will be re-evaluated for pain in the context of function and relationship to baseline as care progresses.  If no significant relieve from pain (remains above 5/10) will transfer patient to inpatient services for further evaluation and management  Code Status: Full  Family Communication: None  DVT Prophylaxis: Ambulate as tolerated  Time spent: 35 Minutes  Vyncent Overby, MD, MHA, FACP, FAAP, CPE  If 7PM-7AM, please contact night-coverage [email or web address] 07/05/2015, 9:43 AM

## 2015-07-05 NOTE — Discharge Summary (Signed)
Physician Discharge Summary  [name] MVH:[id number] DOB: 1985-05-03 DOA: 07/05/2015  PCP: Jeanann Lewandowsky, MD  Admit date: 07/05/2015  Discharge date: 07/05/2015  Time spent: 30 minutes  Discharge Diagnoses:  Active Problems:   Sickle cell anemia with pain [name])   Discharge Condition: Stable  Diet recommendation: Regular  Filed Weights   07/05/15 0937  Weight: 124 lb (56.246 kg)   History of present illness:  Brittany Foster is a 30 y.o. female with history of of sickle cell anemia, who presents to the [facility] withcomplaints of generalize body pain. Pain intensity 9/10, has not been controlled by home medications.She took Percocet 10-325 mg and MS Contin this morning with no sustained relief. She agrees that she is taking all other medications but inconsistently.She was admitted to a hospital near [place] over last weekend and she left AMA 3 days ago. She has been cycling around healthcare facilities for the same complaint seeking for IV pain meds. She had confessed that she is in a lot of domestic stress including loneliness and the fact that she is a single mother of a 86 years old girl. 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. She denies headache, chest pain, shortness of breath, nausea, vomiting, dysuria, or diarrhea. No fever. She is been scheduled for 7 teeth extraction and implant next week. She said she may need to double up on her pain pills after surgery.  Hospital Course:  [name] was admitted to the day hospital with sickle cell painful crisis. Patient was treated with weight based IV Dilaudid PCA, IV Toradol as well as IV fluids. Cambree showed significant improvement symptomatically and pain went down from 9 to 6 out of 10 at the time of discharge, this is her baseline. Roseanna will follow-up in the clinic as previously scheduled, continue with home medications as per prior to admission.  I had a long  conversation with [name] about her pattern of healthcare utilization and dependence on IV pain meds. She said "I know I don't do my part in my management, I don't drink enough water and I take hydrea only when I remember". She promised to start playing active role in managing her disease instead of relying heavily as it is on health system. She was told that if she continues to require this much healthcare utilization for pain relief, then she may need to seek primary care elsewhere. She had been dismissed from [facility] earlier.  Discharge Exam: Filed Vitals:   07/05/15 1436 07/05/15 1537  BP: 99/58 102/61  Pulse: 78 76  Temp:    Resp: 17 17    General appearance: alert, cooperative and no distress Eyes: conjunctivae/corneas clear. PERRL, EOM's intact. Fundi benign. Neck: no adenopathy, no carotid bruit, no JVD, supple, symmetrical, trachea midline and thyroid not enlarged, symmetric, no tenderness/mass/nodules Back: symmetric, no curvature. ROM normal. No CVA tenderness. Resp: clear to auscultation bilaterally Chest wall: no tenderness Cardio: regular rate and rhythm, S1, S2 normal, no murmur, click, rub or gallop GI: soft, non-tender; bowel sounds normal; no masses, no organomegaly Extremities: extremities normal, atraumatic, no cyanosis or edema Pulses: 2+ and symmetric Skin: Skin color, texture, turgor normal. No rashes or lesions Neurologic: Grossly normal  Discharge Instructions We discussed the need for good hydration, monitoring of hydration status, avoidance of heat, cold, stress, and infection triggers. We discussed the need to be compliant with taking Hydrea. [name] was reminded of the need to  seek medical attention of any symptoms of bleeding, anemia, or infection occurs.   Current Discharge Medication List    CONTINUE these medications which have NOT CHANGED   Details  DULoxetine (CYMBALTA) 60 MG capsule Take 1 capsule (60 mg total) by mouth daily. Qty: 30  capsule, Refills: 3   Associated Diagnoses: Chronic pain syndrome    folic acid (FOLVITE) 1 MG tablet Take 1 tablet (1 mg total) by mouth daily. 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 MAY TAKE WITH FOOD TO MINIMIZE GI SIDE EFFECTS. Qty: 60 capsule, Refills: 3    ibuprofen (ADVIL,MOTRIN) 200 MG tablet Take 200 mg by mouth every 6 (six) hours as needed for moderate pain.    !! potassium chloride SA (K-DUR,KLOR-CON) 20 MEQ tablet Take 1 tablet (20 mEq total) by mouth daily. Qty: 30 tablet, Refills: 2   Associated Diagnoses: Hypokalemia    !! potassium chloride SA (K-DUR,KLOR-CON) 20 MEQ tablet TAKE 1 TABLET BY MOUTH DAILY. Qty: 30 tablet, Refills: 3    Cholecalciferol (VITAMIN D3) 5000 UNITS CAPS Take 1 capsule (5,000 Units total) by mouth daily. Qty: 30 capsule, Refills: 5   Associated Diagnoses: Vitamin D deficiency    gabapentin (NEURONTIN) 300 MG capsule Take 1 capsule (300 mg total) by mouth 3 (three) times daily. Qty: 90 capsule, Refills: 2    morphine (MS CONTIN) 30 MG 12 hr tablet Take 1 tablet (30 mg total) by mouth every 12 (twelve) hours. Qty: 30 tablet, Refills: 0   Associated Diagnoses: Hb-SS disease without crisis (HCC)    oxyCODONE-acetaminophen (PERCOCET) 10-325 MG tablet Take 1 tablet by mouth every 6 (six) hours as needed for pain. Qty: 60 tablet, Refills: 0   Associated Diagnoses: Hb-SS disease without crisis (HCC)    Topiramate ER 100 MG CP24 Take 100 mg by mouth at bedtime.     !! - Potential duplicate medications found. Please discuss with provider.     Allergies  Allergen Reactions  . Ultram [Tramadol] Other (See Comments)    seizures  . Zofran [Ondansetron Hcl] Nausea And Vomiting  . Morphine And Related Hives, Rash and Other (See Comments)    Shaking Tolerates Percocet, Norco, and buprenorphine  . Tape Rash    Signed:  Jeanann LewandowskyJEGEDE,  Maicee Ullman MD, MHA, FACP, FAAP, CPE   07/05/2015, 4:09 PM

## 2015-07-05 NOTE — Telephone Encounter (Signed)
Called complaining of pain, 8/10 in back, arms and legs. Denies chest pain, N/V, diarrhea, fever or abdominal pain. Took oxycodone and MS contin at 6 am without relief. Will let provider know and call her back she is aware.

## 2015-07-05 NOTE — Discharge Instructions (Signed)
Sickle Cell Anemia, Adult Sickle cell anemia is a condition in which red blood cells have an abnormal "sickle" shape. This abnormal shape shortens the cells' life span, which results in a lower than normal concentration of red blood cells in the blood. The sickle shape also causes the cells to clump together and block free blood flow through the blood vessels. As a result, the tissues and organs of the body do not receive enough oxygen. Sickle cell anemia causes organ damage and pain and increases the risk of infection. CAUSES  Sickle cell anemia is a genetic disorder. Those who receive two copies of the gene have the condition, and those who receive one copy have the trait. RISK FACTORS The sickle cell gene is most common in people whose families originated in [place]. Other areas of the globe where sickle cell trait occurs include the Mediterranean, [place] and [place], the [place], and the [place].  SIGNS AND SYMPTOMS  Pain, especially in the extremities, back, chest, or abdomen (common). The pain may start suddenly or may develop following an illness, especially if there is dehydration. Pain can also occur due to overexertion or exposure to extreme temperature changes.  Frequent severe bacterial infections, especially certain types of pneumonia and meningitis.  Pain and swelling in the hands and feet.  Decreased activity.   Loss of appetite.   Change in behavior.  Headaches.  Seizures.  Shortness of breath or difficulty breathing.  Vision changes.  Skin ulcers. Those with the trait may not have symptoms or they may have mild symptoms.  DIAGNOSIS  Sickle cell anemia is diagnosed with blood tests that demonstrate the genetic trait. It is often diagnosed during the newborn period, due to mandatory testing nationwide. A variety of blood tests, X-rays, CT scans, MRI scans, ultrasounds, and lung function tests may also be done to monitor the condition. TREATMENT  Sickle  cell anemia may be treated with:  Medicines. You may be given pain medicines, antibiotic medicines (to treat and prevent infections) or medicines to increase the production of certain types of hemoglobin.  Fluids.  Oxygen.  Blood transfusions. HOME CARE INSTRUCTIONS   Drink enough fluid to keep your urine clear or pale yellow. Increase your fluid intake in hot weather and during exercise.  Do not smoke. Smoking lowers oxygen levels in the blood.   Only take over-the-counter or prescription medicines for pain, fever, or discomfort as directed by your health care provider.  Take antibiotics as directed by your health care provider. Make sure you finish them it even if you start to feel better.   Take supplements as directed by your health care provider.   Consider wearing a medical alert bracelet. This tells anyone caring for you in an emergency of your condition.   When traveling, keep your medical information, health care provider's names, and the medicines you take with you at all times.   If you develop a fever, do not take medicines to reduce the fever right away. This could cover up a problem that is developing. Notify your health care provider.  Keep all follow-up appointments with your health care provider. Sickle cell anemia requires regular medical care. SEEK MEDICAL CARE IF: You have a fever. SEEK IMMEDIATE MEDICAL CARE IF:   You feel dizzy or faint.   You have new abdominal pain, especially on the left side near the stomach area.   You develop a persistent, often uncomfortable and painful penile erection (priapism). If this is not treated immediately it   will lead to impotence.   You have numbness your arms or legs or you have a hard time moving them.   You have a hard time with speech.   You have a fever or persistent symptoms for more than 2-3 days.   You have a fever and your symptoms suddenly get worse.   You have signs or symptoms of infection.  These include:   Chills.   Abnormal tiredness (lethargy).   Irritability.   Poor eating.   Vomiting.   You develop pain that is not helped with medicine.   You develop shortness of breath.  You have pain in your chest.   You are coughing up pus-like or bloody sputum.   You develop a stiff neck.  Your feet or hands swell or have pain.  Your abdomen appears bloated.  You develop joint pain. MAKE SURE YOU:  Understand these instructions.   This information is not intended to replace advice given to you by your health care provider. Make sure you discuss any questions you have with your health care provider.   Document Released: 05/27/2005 Document Revised: 03/09/2014 Document Reviewed: 09/28/2012 Elsevier Interactive Patient Education 2016 [employer]  

## 2015-07-12 ENCOUNTER — Telehealth (HOSPITAL_COMMUNITY): Payer: Self-pay | Admitting: *Deleted

## 2015-07-12 ENCOUNTER — Emergency Department (HOSPITAL_COMMUNITY): Payer: Medicaid Other

## 2015-07-12 ENCOUNTER — Encounter (HOSPITAL_COMMUNITY): Payer: Self-pay | Admitting: *Deleted

## 2015-07-12 ENCOUNTER — Non-Acute Institutional Stay (HOSPITAL_BASED_OUTPATIENT_CLINIC_OR_DEPARTMENT_OTHER)
Admission: AD | Admit: 2015-07-12 | Discharge: 2015-07-12 | Disposition: A | Discharge status: Home / Self Care | Payer: Medicaid Other | Source: Ambulatory Visit | Attending: Internal Medicine | Admitting: Internal Medicine

## 2015-07-12 ENCOUNTER — Inpatient Hospital Stay (HOSPITAL_COMMUNITY)
Admission: EM | Admit: 2015-07-12 | Discharge: 2015-07-19 | DRG: 812 | Disposition: A | Discharge status: Home / Self Care | Payer: Medicaid Other | Attending: Internal Medicine | Admitting: Internal Medicine

## 2015-07-12 ENCOUNTER — Other Ambulatory Visit: Payer: Self-pay | Admitting: Family Medicine

## 2015-07-12 DIAGNOSIS — Z87891 Personal history of nicotine dependence: Secondary | ICD-10-CM

## 2015-07-12 DIAGNOSIS — D5700 Hb-SS disease with crisis, unspecified: Principal | ICD-10-CM | POA: Diagnosis present

## 2015-07-12 DIAGNOSIS — G894 Chronic pain syndrome: Secondary | ICD-10-CM | POA: Diagnosis present

## 2015-07-12 DIAGNOSIS — Z95828 Presence of other vascular implants and grafts: Secondary | ICD-10-CM

## 2015-07-12 DIAGNOSIS — D571 Sickle-cell disease without crisis: Secondary | ICD-10-CM

## 2015-07-12 DIAGNOSIS — Z79891 Long term (current) use of opiate analgesic: Secondary | ICD-10-CM | POA: Insufficient documentation

## 2015-07-12 DIAGNOSIS — D72829 Elevated white blood cell count, unspecified: Secondary | ICD-10-CM | POA: Diagnosis present

## 2015-07-12 DIAGNOSIS — D638 Anemia in other chronic diseases classified elsewhere: Secondary | ICD-10-CM | POA: Diagnosis not present

## 2015-07-12 DIAGNOSIS — R079 Chest pain, unspecified: Secondary | ICD-10-CM | POA: Diagnosis present

## 2015-07-12 DIAGNOSIS — I878 Other specified disorders of veins: Secondary | ICD-10-CM

## 2015-07-12 DIAGNOSIS — D588 Other specified hereditary hemolytic anemias: Secondary | ICD-10-CM | POA: Diagnosis not present

## 2015-07-12 DIAGNOSIS — E876 Hypokalemia: Secondary | ICD-10-CM | POA: Diagnosis present

## 2015-07-12 DIAGNOSIS — R0789 Other chest pain: Secondary | ICD-10-CM

## 2015-07-12 DIAGNOSIS — Z9114 Patient's other noncompliance with medication regimen: Secondary | ICD-10-CM

## 2015-07-12 DIAGNOSIS — Z9049 Acquired absence of other specified parts of digestive tract: Secondary | ICD-10-CM

## 2015-07-12 DIAGNOSIS — T859XXA Unspecified complication of internal prosthetic device, implant and graft, initial encounter: Secondary | ICD-10-CM | POA: Diagnosis present

## 2015-07-12 DIAGNOSIS — Y848 Other medical procedures as the cause of abnormal reaction of the patient, or of later complication, without mention of misadventure at the time of the procedure: Secondary | ICD-10-CM | POA: Diagnosis present

## 2015-07-12 DIAGNOSIS — Z79899 Other long term (current) drug therapy: Secondary | ICD-10-CM | POA: Insufficient documentation

## 2015-07-12 DIAGNOSIS — T82598A Other mechanical complication of other cardiac and vascular devices and implants, initial encounter: Secondary | ICD-10-CM | POA: Diagnosis not present

## 2015-07-12 DIAGNOSIS — Z452 Encounter for adjustment and management of vascular access device: Secondary | ICD-10-CM

## 2015-07-12 LAB — CBC WITH DIFFERENTIAL/PLATELET
Basophils Absolute: 0 10*3/uL (ref 0.0–0.1)
Basophils Relative: 0 %
EOS PCT: 1 %
Eosinophils Absolute: 0.1 10*3/uL (ref 0.0–0.7)
HEMATOCRIT: 25.8 % — AB (ref 36.0–46.0)
HEMOGLOBIN: 8.9 g/dL — AB (ref 12.0–15.0)
LYMPHS PCT: 28 %
Lymphs Abs: 3.6 10*3/uL (ref 0.7–4.0)
MCH: 31.8 pg (ref 26.0–34.0)
MCHC: 34.5 g/dL (ref 30.0–36.0)
MCV: 92.1 fL (ref 78.0–100.0)
Monocytes Absolute: 1 10*3/uL (ref 0.1–1.0)
Monocytes Relative: 8 %
NEUTROS ABS: 8.3 10*3/uL — AB (ref 1.7–7.7)
NEUTROS PCT: 63 %
Platelets: 400 10*3/uL (ref 150–400)
RBC: 2.8 MIL/uL — ABNORMAL LOW (ref 3.87–5.11)
RDW: 27.3 % — ABNORMAL HIGH (ref 11.5–15.5)
WBC: 13 10*3/uL — ABNORMAL HIGH (ref 4.0–10.5)

## 2015-07-12 LAB — COMPREHENSIVE METABOLIC PANEL
ALK PHOS: 93 U/L (ref 38–126)
ALT: 32 U/L (ref 14–54)
AST: 50 U/L — ABNORMAL HIGH (ref 15–41)
Albumin: 4.3 g/dL (ref 3.5–5.0)
Anion gap: 9 (ref 5–15)
BILIRUBIN TOTAL: 2.1 mg/dL — AB (ref 0.3–1.2)
BUN: 6 mg/dL (ref 6–20)
CALCIUM: 9.2 mg/dL (ref 8.9–10.3)
CO2: 24 mmol/L (ref 22–32)
CREATININE: 0.41 mg/dL — AB (ref 0.44–1.00)
Chloride: 107 mmol/L (ref 101–111)
Glucose, Bld: 129 mg/dL — ABNORMAL HIGH (ref 65–99)
Potassium: 3.4 mmol/L — ABNORMAL LOW (ref 3.5–5.1)
Sodium: 140 mmol/L (ref 135–145)
TOTAL PROTEIN: 7.4 g/dL (ref 6.5–8.1)

## 2015-07-12 LAB — RETICULOCYTES
RBC.: 2.8 MIL/uL — AB (ref 3.87–5.11)
RETIC CT PCT: 14.3 % — AB (ref 0.4–3.1)
Retic Count, Absolute: 400.4 10*3/uL — ABNORMAL HIGH (ref 19.0–186.0)

## 2015-07-12 LAB — LACTATE DEHYDROGENASE: LDH: 243 U/L — ABNORMAL HIGH (ref 98–192)

## 2015-07-12 LAB — MAGNESIUM: Magnesium: 2 mg/dL (ref 1.7–2.4)

## 2015-07-12 LAB — HCG, QUANTITATIVE, PREGNANCY: hCG, Beta Chain, Quant, S: <1 m[IU]/mL (ref <5)

## 2015-07-12 MED ORDER — NALOXONE HCL 0.4 MG/ML IJ SOLN: 0.4000 mg | INTRAMUSCULAR | Status: DC | PRN

## 2015-07-12 MED ORDER — HYDROMORPHONE 1 MG/ML IV SOLN: INTRAVENOUS | Status: DC

## 2015-07-12 MED ORDER — SODIUM CHLORIDE 0.45 % IV SOLN
INTRAVENOUS | Status: DC
  Administered 2015-07-12 – 2015-07-19 (×14): via INTRAVENOUS

## 2015-07-12 MED ORDER — SODIUM CHLORIDE 0.9% FLUSH: 9.0000 mL | INTRAVENOUS | Status: DC | PRN

## 2015-07-12 MED ORDER — DIPHENHYDRAMINE HCL 50 MG/ML IJ SOLN
12.5000 mg | Freq: Once | INTRAMUSCULAR | Status: AC
  Administered 2015-07-12: 12.5 mg via INTRAVENOUS
  Filled 2015-07-12: qty 1

## 2015-07-12 MED ORDER — PROMETHAZINE HCL 25 MG PO TABS
12.5000 mg | ORAL_TABLET | ORAL | Status: DC | PRN
Start: 2015-07-12 — End: 2015-07-12

## 2015-07-12 MED ORDER — HYDROXYUREA 500 MG PO CAPS
1000.0000 mg | ORAL_CAPSULE | Freq: Every day | ORAL | Status: DC
  Administered 2015-07-13 – 2015-07-19 (×7): 1000 mg via ORAL
  Filled 2015-07-12 (×7): qty 2

## 2015-07-12 MED ORDER — HYDROMORPHONE HCL 2 MG/ML IJ SOLN: 0.0250 mg/kg | INTRAMUSCULAR | Status: AC

## 2015-07-12 MED ORDER — OXYCODONE HCL 5 MG PO TABS
5.0000 mg | ORAL_TABLET | Freq: Once | ORAL | Status: AC
  Administered 2015-07-12: 5 mg via ORAL
  Filled 2015-07-12: qty 1

## 2015-07-12 MED ORDER — SODIUM CHLORIDE 0.9% FLUSH
10.0000 mL | INTRAVENOUS | Status: AC | PRN
  Administered 2015-07-12: 10 mL

## 2015-07-12 MED ORDER — SODIUM CHLORIDE 0.9 % IV SOLN: INTRAVENOUS | Status: DC

## 2015-07-12 MED ORDER — DEXTROSE-NACL 5-0.45 % IV SOLN: INTRAVENOUS | Status: DC

## 2015-07-12 MED ORDER — ENOXAPARIN SODIUM 40 MG/0.4ML ~~LOC~~ SOLN
40.0000 mg | Freq: Every day | SUBCUTANEOUS | Status: DC
  Administered 2015-07-13: 40 mg via SUBCUTANEOUS
  Filled 2015-07-12 (×2): qty 0.4

## 2015-07-12 MED ORDER — DIPHENHYDRAMINE HCL 50 MG/ML IJ SOLN
25.0000 mg | Freq: Once | INTRAMUSCULAR | Status: AC
Start: 2015-07-12 — End: 2015-07-12
  Administered 2015-07-12: 25 mg via INTRAVENOUS
  Filled 2015-07-12: qty 1

## 2015-07-12 MED ORDER — OXYCODONE-ACETAMINOPHEN 5-325 MG PO TABS
1.0000 | ORAL_TABLET | Freq: Once | ORAL | Status: AC
  Administered 2015-07-12: 1 via ORAL
  Filled 2015-07-12: qty 1

## 2015-07-12 MED ORDER — POTASSIUM CHLORIDE CRYS ER 20 MEQ PO TBCR
20.0000 meq | EXTENDED_RELEASE_TABLET | Freq: Every day | ORAL | Status: DC
Start: 2015-07-13 — End: 2015-07-19
  Administered 2015-07-13 – 2015-07-19 (×6): 20 meq via ORAL
  Filled 2015-07-12 (×6): qty 1

## 2015-07-12 MED ORDER — FOLIC ACID 1 MG PO TABS
1.0000 mg | ORAL_TABLET | Freq: Every day | ORAL | Status: DC
  Administered 2015-07-13 – 2015-07-19 (×7): 1 mg via ORAL
  Filled 2015-07-12 (×7): qty 1

## 2015-07-12 MED ORDER — HYDROMORPHONE HCL 2 MG/ML IJ SOLN: 2.0000 mg | INTRAMUSCULAR | Status: DC | PRN

## 2015-07-12 MED ORDER — SODIUM CHLORIDE 0.9 % IV SOLN
Freq: Once | INTRAVENOUS | Status: AC
  Administered 2015-07-12: 17:00:00 via INTRAVENOUS

## 2015-07-12 MED ORDER — DIPHENHYDRAMINE HCL 12.5 MG/5ML PO ELIX: 12.5000 mg | ORAL_SOLUTION | Freq: Four times a day (QID) | ORAL | Status: DC | PRN

## 2015-07-12 MED ORDER — POTASSIUM CHLORIDE CRYS ER 20 MEQ PO TBCR
40.0000 meq | EXTENDED_RELEASE_TABLET | Freq: Once | ORAL | Status: AC
  Administered 2015-07-13: 40 meq via ORAL
  Filled 2015-07-12: qty 2

## 2015-07-12 MED ORDER — DIPHENHYDRAMINE HCL 50 MG/ML IJ SOLN
12.5000 mg | Freq: Four times a day (QID) | INTRAMUSCULAR | Status: DC | PRN
  Administered 2015-07-13 (×2): 12.5 mg via INTRAVENOUS
  Filled 2015-07-12 (×2): qty 1

## 2015-07-12 MED ORDER — HYDROMORPHONE HCL 2 MG/ML IJ SOLN
2.0000 mg | Freq: Once | INTRAMUSCULAR | Status: AC
  Administered 2015-07-12: 2 mg via SUBCUTANEOUS
  Filled 2015-07-12: qty 1

## 2015-07-12 MED ORDER — HYDROMORPHONE 1 MG/ML IV SOLN
INTRAVENOUS | Status: DC
  Administered 2015-07-13: 0.3 mg via INTRAVENOUS
  Administered 2015-07-13: 1 mg via INTRAVENOUS
  Administered 2015-07-13: 3.8 mg via INTRAVENOUS
  Filled 2015-07-12: qty 25

## 2015-07-12 MED ORDER — SODIUM CHLORIDE 0.9 % IV SOLN
12.5000 mg | Freq: Four times a day (QID) | INTRAVENOUS | Status: DC | PRN
  Filled 2015-07-12 (×2): qty 0.25

## 2015-07-12 MED ORDER — KETOROLAC TROMETHAMINE 30 MG/ML IJ SOLN
30.0000 mg | Freq: Four times a day (QID) | INTRAMUSCULAR | Status: DC
  Administered 2015-07-13 – 2015-07-17 (×16): 30 mg via INTRAVENOUS
  Filled 2015-07-12 (×17): qty 1

## 2015-07-12 MED ORDER — HYDROMORPHONE HCL 2 MG/ML IJ SOLN
0.0313 mg/kg | INTRAMUSCULAR | Status: AC
  Filled 2015-07-12: qty 1

## 2015-07-12 MED ORDER — HYDROMORPHONE HCL 2 MG/ML IJ SOLN
0.0375 mg/kg | INTRAMUSCULAR | Status: AC
  Administered 2015-07-12: 2.1 mg via INTRAVENOUS
  Filled 2015-07-12: qty 2

## 2015-07-12 MED ORDER — KETOROLAC TROMETHAMINE 30 MG/ML IJ SOLN
30.0000 mg | Freq: Once | INTRAMUSCULAR | Status: AC
  Administered 2015-07-12: 30 mg via INTRAMUSCULAR
  Filled 2015-07-12: qty 1

## 2015-07-12 MED ORDER — TOPIRAMATE 25 MG PO TABS
50.0000 mg | ORAL_TABLET | Freq: Two times a day (BID) | ORAL | Status: DC
  Administered 2015-07-13 – 2015-07-19 (×14): 50 mg via ORAL
  Filled 2015-07-12 (×16): qty 2

## 2015-07-12 MED ORDER — DULOXETINE HCL 60 MG PO CPEP
60.0000 mg | ORAL_CAPSULE | Freq: Every day | ORAL | Status: DC
  Administered 2015-07-13 – 2015-07-19 (×7): 60 mg via ORAL
  Filled 2015-07-12 (×7): qty 1

## 2015-07-12 MED ORDER — IOPAMIDOL (ISOVUE-370) INJECTION 76%
100.0000 mL | Freq: Once | INTRAVENOUS | Status: AC | PRN
  Administered 2015-07-12: 100 mL via INTRAVENOUS

## 2015-07-12 MED ORDER — PROMETHAZINE HCL 25 MG PO TABS
25.0000 mg | ORAL_TABLET | ORAL | Status: DC | PRN
  Administered 2015-07-12: 25 mg via ORAL
  Filled 2015-07-12 (×2): qty 1

## 2015-07-12 MED ORDER — PROMETHAZINE HCL 25 MG PO TABS: 12.5000 mg | ORAL_TABLET | ORAL | Status: DC | PRN

## 2015-07-12 MED ORDER — POLYETHYLENE GLYCOL 3350 17 G PO PACK: 17.0000 g | PACK | Freq: Every day | ORAL | Status: DC | PRN

## 2015-07-12 MED ORDER — SODIUM CHLORIDE 0.9 % IV BOLUS (SEPSIS)
1000.0000 mL | Freq: Once | INTRAVENOUS | Status: AC
  Administered 2015-07-13: 1000 mL via INTRAVENOUS

## 2015-07-12 MED ORDER — PROMETHAZINE HCL 25 MG RE SUPP: 12.5000 mg | RECTAL | Status: DC | PRN

## 2015-07-12 MED ORDER — SENNOSIDES-DOCUSATE SODIUM 8.6-50 MG PO TABS
1.0000 | ORAL_TABLET | Freq: Two times a day (BID) | ORAL | Status: DC
  Administered 2015-07-13 – 2015-07-18 (×4): 1 via ORAL
  Filled 2015-07-12 (×11): qty 1

## 2015-07-12 MED ORDER — HYDROMORPHONE HCL 2 MG/ML IJ SOLN
0.0250 mg/kg | INTRAMUSCULAR | Status: AC
  Administered 2015-07-12: 1.4 mg via INTRAVENOUS
  Filled 2015-07-12: qty 1

## 2015-07-12 MED ORDER — HYDROMORPHONE HCL 2 MG/ML IJ SOLN
0.0313 mg/kg | INTRAMUSCULAR | Status: AC
  Administered 2015-07-12: 1.7 mg via INTRAVENOUS
  Filled 2015-07-12: qty 1

## 2015-07-12 MED ORDER — KETOROLAC TROMETHAMINE 30 MG/ML IJ SOLN: 30.0000 mg | Freq: Once | INTRAMUSCULAR | Status: DC

## 2015-07-12 MED ORDER — HEPARIN SOD (PORK) LOCK FLUSH 100 UNIT/ML IV SOLN
500.0000 [IU] | INTRAVENOUS | Status: AC | PRN
  Administered 2015-07-12: 500 [IU]

## 2015-07-12 MED ORDER — HYDROMORPHONE HCL 2 MG/ML IJ SOLN: 0.0375 mg/kg | INTRAMUSCULAR | Status: AC

## 2015-07-12 NOTE — H&P (Signed)
[facility] History and Physical   Date: 07/12/2015  Patient name: Brittany Foster Medical record number: [id number] Date of birth: 08/27/1985 Age: 30 y.o. Gender: female PCP: Jeanann Lewandowsky, MD  Attending physician: Quentin Angst, MD  Chief Complaint: Generalized pain  History of Present Illness: [name], a 30 year old female with a history of sickle cell anemia, HbSS was admitted to the day infusion center for generalized pain consistent with sickle cell anemia. Patient reports that she had 7 teeth extracted on 07/09/2015. She says that teeth extraction triggered current crisis. Pain has been unrelieved by oral pain medications. She last had Percocet 10-325 mg at around 7 am with minimal relief. She says that pain intensity is 9/10 described as constant and aching. She reports fatigue. She denies headache, chest pain, shortness of breath, dysuria, nausea, vomiting, or diarrhea.   Meds: Prescriptions prior to admission  Medication Sig Dispense Refill Last Dose  . Cholecalciferol (VITAMIN D3) 5000 UNITS CAPS Take 1 capsule (5,000 Units total) by mouth daily. 30 capsule 5 Unknown at Unknown time  . DULoxetine (CYMBALTA) 60 MG capsule Take 1 capsule (60 mg total) by mouth daily. 30 capsule 3 07/04/2015 at Unknown time  . folic acid (FOLVITE) 1 MG tablet Take 1 tablet (1 mg total) by mouth daily. 90 tablet 11 07/04/2015 at Unknown time  . gabapentin (NEURONTIN) 300 MG capsule Take 1 capsule (300 mg total) by mouth 3 (three) times daily. 90 capsule 2 Unknown at Unknown time  . hydroxyurea (HYDREA) 500 MG capsule TAKE 2 CAPSULES BY MOUTH DAILY. MAY TAKE WITH FOOD TO MINIMIZE GI SIDE EFFECTS. 60 capsule 3 07/04/2015 at Unknown time  . ibuprofen (ADVIL,MOTRIN) 200 MG tablet Take 200 mg by mouth every 6 (six) hours as needed for moderate pain.   07/04/2015 at Unknown time  . morphine (MS CONTIN) 30 MG 12 hr tablet Take 1 tablet (30 mg total) by mouth every 12 (twelve) hours. 30  tablet 0   . oxyCODONE-acetaminophen (PERCOCET) 10-325 MG tablet Take 1 tablet by mouth every 6 (six) hours as needed for pain. 60 tablet 0   . potassium chloride SA (K-DUR,KLOR-CON) 20 MEQ tablet Take 1 tablet (20 mEq total) by mouth daily. 30 tablet 2 07/04/2015 at Unknown time  . potassium chloride SA (K-DUR,KLOR-CON) 20 MEQ tablet TAKE 1 TABLET BY MOUTH DAILY. 30 tablet 3 07/04/2015 at Unknown time  . Topiramate ER 100 MG CP24 Take 100 mg by mouth at bedtime.   Past Week at Unknown time    Allergies: Ultram; Zofran; Morphine and related; and Tape Past Medical History  Diagnosis Date  . Sickle cell anemia (HCC)   . Anemia    Past Surgical History  Procedure Laterality Date  . Tubal ligation    . Cholecystectomy    . Cesarean section    . Port a cath placement Right     about 6-7 years ago  . Cholecystectomy  2000   Family History  Problem Relation Age of Onset  . Sickle cell anemia Other   . Sickle cell trait Father   . Sickle cell trait Mother    Social History   Social History  . Marital Status: Single    Spouse Name: N/A  . Number of Children: N/A  . Years of Education: N/A   Occupational History  . Not on file.   Social History Main Topics  . Smoking status: Former Games developer  . Smokeless tobacco: Not on file  Comment: smokes once every couple of weeks.   . Alcohol Use: No  . Drug Use: No  . Sexual Activity: Yes    Birth Control/ Protection: Surgical   Other Topics Concern  . Not on file   Social History Narrative    Review of Systems: Eyes: positive for icterus Ears, nose, mouth, throat, and face: positive for sore mouth Respiratory: negative for cough and dyspnea on exertion Cardiovascular: negative for chest pain, dyspnea and fatigue Gastrointestinal: negative for constipation, dyspepsia and dysphagia Genitourinary:negative Integument/breast: negative Hematologic/lymphatic: negative Musculoskeletal:positive for myalgias Neurological:  negative Behavioral/Psych: negative Endocrine: negative Allergic/Immunologic: negative  Physical Exam: Blood pressure 114/77, pulse 112, temperature 99.3 F (37.4 C), resp. rate 20, height 5' (1.524 m), weight 122 lb (55.339 kg), SpO2 100 %. BP 114/77 mmHg  Pulse 112  Temp(Src) 99.3 F (37.4 C)  Resp 20  Ht 5' (1.524 m)  Wt 122 lb (55.339 kg)  BMI 23.83 kg/m2  SpO2 100%  General Appearance:    Alert, cooperative, mild distress, appears stated age  Head:    Normocephalic, without obvious abnormality, atraumatic  Eyes:    PERRL, conjunctiva/corneas clear, EOM's intact, fundi    benign, both eyes  Ears:    Normal TM's and external ear canals, both ears  Nose:   Nares normal, septum midline, mucosa normal, no drainage    or sinus tenderness  Throat:   Lips, mild edema/erythema to mucosa, and tongue normal; multiple teeth extracted  Neck:   Supple, symmetrical, trachea midline, no adenopathy;    thyroid:  no enlargement/tenderness/nodules; no carotid   bruit or JVD  Back:     Symmetric, no curvature, ROM normal, no CVA tenderness  Lungs:     Clear to auscultation bilaterally, respirations unlabored  Chest Wall:    No tenderness or deformity   Heart:    Regular rate and rhythm, S1 and S2 normal, no murmur, rub   or gallop  Abdomen:     Soft, non-tender, bowel sounds active all four quadrants,    no masses, no organomegaly  Extremities:   Extremities normal, atraumatic, no cyanosis or edema  Pulses:   2+ and symmetric all extremities  Skin:   Skin color, texture, turgor normal, no rashes or lesions  Lymph nodes:   Cervical, supraclavicular, and axillary nodes normal  Neurologic:   CNII-XII intact, normal strength, sensation and reflexes    throughout    Lab results: No results found for this or any previous visit (from the past 24 hour(s)).  Imaging results:  No results found.   Assessment & Plan:  Patient will be admitted to the day infusion center for extended  observation  Start IV D5.45 for cellular rehydration at 125/hr  Start Toradol 30 mg IV every 6 hours for inflammation.  Start Dilaudid PCA High Concentration per weight based protocol.   Patient will be re-evaluated for pain intensity in the context of function and relationship to           baseline as care progresses.  If no significant pain relief, will transfer patient to inpatient services for a higher level of care.   Will check CMP and CBC w/differential  [name],Lachina M 07/12/2015, 11:37 AM

## 2015-07-12 NOTE — ED Provider Notes (Signed)
CSN: [id number]     Arrival date & time 07/12/15  1624 History   First MD Initiated Contact with Patient 07/12/15 1633     Chief Complaint  Patient presents with  . Sickle Cell Pain Crisis  . Pain  . Chest Pain     (Consider location/radiation/quality/duration/timing/severity/associated sxs/prior Treatment) HPI   30 year old female with history of sickle cell anemia presenting complaining of sickle cell related pain. Patient report 4 days ago she had 6 teeth pulled. Since then she has been feeling sick. She reported having low-grade fever, chills, chest pain, difficulty breathing,  upper abdominal pain. She rated pain as 8 out of 10 sharp achy constant pain similar to prior sickle cell crisis. Pt also report feeling dehydrated. No complaints of severe headache, neck stiffness, productive cough, hemoptysis, dysuria, nausea vomiting diarrhea, focal numbness or weakness, or rash. She did take her home medication without adequate improvement. Her last menstrual period was 5/7. She went to her sickle cell clinic today to request evaluation of her sickle cell related pain but since she described having chest pain and she was sent here to the ED for further evaluation.  Past Medical History  Diagnosis Date  . Sickle cell anemia (HCC)   . Anemia    Past Surgical History  Procedure Laterality Date  . Tubal ligation    . Cholecystectomy    . Cesarean section    . Port a cath placement Right     about 6-7 years ago  . Cholecystectomy  2000  . Multiple tooth extractions N/A    Family History  Problem Relation Age of Onset  . Sickle cell anemia Other   . Sickle cell trait Father   . Sickle cell trait Mother    Social History  Substance Use Topics  . Smoking status: Former Games developer  . Smokeless tobacco: None     Comment: smokes once every couple of weeks.   . Alcohol Use: No   OB History    No data available     Review of Systems  All other systems reviewed and are  negative.     Allergies  Ultram; Zofran; Buprenorphine hcl; Morphine and related; and Tape  Home Medications   Prior to Admission medications   Medication Sig Start Date End Date Taking? Authorizing Provider  DULoxetine (CYMBALTA) 60 MG capsule Take 1 capsule (60 mg total) by mouth daily. 01/08/15  Yes Quentin Angst, MD  folic acid (FOLVITE) 1 MG tablet Take 1 tablet (1 mg total) by mouth daily. 01/08/15  Yes Quentin Angst, MD  hydroxyurea (HYDREA) 500 MG capsule TAKE 2 CAPSULES BY MOUTH DAILY. MAY TAKE WITH FOOD TO MINIMIZE GI SIDE EFFECTS. 06/26/15  Yes Quentin Angst, MD  ibuprofen (ADVIL,MOTRIN) 200 MG tablet Take 200 mg by mouth every 6 (six) hours as needed for moderate pain.   Yes Historical Provider, MD  morphine (MS CONTIN) 30 MG 12 hr tablet Take 1 tablet (30 mg total) by mouth every 12 (twelve) hours. 07/05/15  Yes Quentin Angst, MD  oxyCODONE-acetaminophen (PERCOCET) 10-325 MG tablet Take 1 tablet by mouth every 6 (six) hours as needed for pain. 07/05/15  Yes Quentin Angst, MD  potassium chloride SA (K-DUR,KLOR-CON) 20 MEQ tablet TAKE 1 TABLET BY MOUTH DAILY. 06/26/15  Yes Quentin Angst, MD  Topiramate ER 100 MG CP24 Take 100 mg by mouth at bedtime.   Yes Historical Provider, MD  Cholecalciferol (VITAMIN D3) 5000 UNITS CAPS Take 1 capsule (5,000  Units total) by mouth daily. Patient not taking: Reported on 07/12/2015 01/08/15   Quentin Angst, MD  gabapentin (NEURONTIN) 300 MG capsule Take 1 capsule (300 mg total) by mouth 3 (three) times daily. Patient not taking: Reported on 07/12/2015 02/14/14   Altha Harm, MD  potassium chloride SA (K-DUR,KLOR-CON) 20 MEQ tablet Take 1 tablet (20 mEq total) by mouth daily. Patient not taking: Reported on 07/12/2015 01/08/15   Quentin Angst, MD   BP 108/82 mmHg  Pulse 109  Temp(Src) 98.7 F (37.1 C) (Oral)  Resp 16  SpO2 95%  LMP 07/07/2015 Physical Exam  Constitutional: She is oriented to  person, place, and time. She appears well-developed and well-nourished. No distress.  African-American female appears to be in no acute discomfort, nontoxic.  HENT:  Head: Atraumatic.  Right Ear: External ear normal.  Left Ear: External ear normal.  Nose: Nose normal.  Mouth/Throat: Oropharynx is clear and moist. No oropharyngeal exudate.  Oral cavity: Several previously dental extraction without signs of infection.  Eyes: Conjunctivae are normal.  Neck: Normal range of motion. Neck supple.  No nuchal rigidity.  Cardiovascular: Normal rate and regular rhythm.   Pulmonary/Chest: Effort normal and breath sounds normal. No respiratory distress. She has no wheezes.  Abdominal: Soft. Bowel sounds are normal. She exhibits no distension. There is no tenderness.  Neurological: She is alert and oriented to person, place, and time.  Skin: No rash noted.  Psychiatric: She has a normal mood and affect.  Nursing note and vitals reviewed.   ED Course  Procedures (including critical care time) Labs Review Labs Reviewed  CBC WITH DIFFERENTIAL/PLATELET - Abnormal; Notable for the following:    WBC 13.0 (*)    RBC 2.80 (*)    Hemoglobin 8.9 (*)    HCT 25.8 (*)    RDW 27.3 (*)    Neutro Abs 8.3 (*)    All other components within normal limits  COMPREHENSIVE METABOLIC PANEL - Abnormal; Notable for the following:    Potassium 3.4 (*)    Glucose, Bld 129 (*)    Creatinine, Ser 0.41 (*)    AST 50 (*)    Total Bilirubin 2.1 (*)    All other components within normal limits  RETICULOCYTES - Abnormal; Notable for the following:    Retic Ct Pct 14.3 (*)    RBC. 2.80 (*)    Retic Count, Manual 400.4 (*)    All other components within normal limits  HCG, QUANTITATIVE, PREGNANCY    Imaging Review Dg Chest 2 View  07/12/2015  CLINICAL DATA:  Sickle cell crisis.  Chest pain for 1 week. EXAM: CHEST - 2 VIEW COMPARISON:  Two-view chest x-ray 03/07/2015 and 03/03/2015 FINDINGS: The heart size is  normal. No significant edema or effusion is present to suggest failure. No focal airspace disease is present. A right-sided Port-A-Cath is again seen. The catheter has changed position. The tip appears to be within the azygos vein. IMPRESSION: 1. Altered position of Port-A-Cath. The tip is suspected to be in the as azygous vein. If the catheter has been replaced since 03/07/15, I cannot exclude arterial positioning. CT or CTA of the chest would be useful for further evaluation. If the catheter has not been manipulated since January, follow-up with interventional radiology is recommended for catheter repositioning. These results were called by telephone at the time of interpretation on 07/12/2015 at 6:07 pm to Dr. Carlena Sax, who verbally acknowledged these results. Electronically Signed   By: Marin Roberts  M.D.   On: 07/12/2015 18:08   I have personally reviewed and evaluated these images and lab results as part of my medical decision-making.   EKG Interpretation None      MDM   Final diagnoses:  Sickle cell anemia with pain (HCC)    BP 109/77 mmHg  Pulse 96  Temp(Src) 98.7 F (37.1 C) (Oral)  Resp 15  Wt 55.339 kg  SpO2 96%  LMP 07/07/2015   5:41 PM Patient here with chest pain shortness of breath up abdominal pain. In the nursing noted mention patient complaining of dizziness nausea vomiting however patient denies. Patient states her sickle cell disease has not been well managed affect her doctor has tried to change around her medication several months ago. Patient does not appear toxic. Workup initiated. Pain medication given.  6:54 PM CRX result show an altered position of Port-A-Cath with the tip suspected to be in the azygous vein, but cannot exclude arterial positioning.  Pt report she has her port-a-cath placed in Feb and also states blood cannot be drawn from it.  Therefore, plan to obtain Chest CTA for further management.  Care discussed with Dr. Dalene SeltzerSchlossman.    9:22  PM Care discussed with oncoming provider who will f/u on Chest CTA and continue management of pt's pain.  Pt report pain is not well controlled after receiving 2 separates doses of pain meds.  Another course of pain medication will be given.  If no improvement and chest CTA showing any anomaly, then pt will need to be admitted for further care.    [name] HelperBowie Shoichi Mielke, PA-C 07/12/15 2124  Alvira MondayErin Schlossman, MD 07/15/15 [phone]

## 2015-07-12 NOTE — H&P (Signed)
[facility] Admission History and Physical       Maleea Camilo ZOX:[id number] DOB: [birth year] DOA: 07/12/2015  Referring physician:  EDP PCP: Jeanann Lewandowsky, MD  Specialists:   Chief Complaint: Chest and Back Pain and Pain All over  HPI: Wanna Gully is a 30 y.o. female with a history of Sickle Cell disease who presents to the ED with complaints of  9/10 Pain all over x 4 days and Chest pain and SOB for the past 2 days.  She [facility] and sent to the ED.  A Chest X-ray and CTA of the Chest were performed, and were negative for acute findings, however her Port A-Cath was incidentally found to be displaced .   She was referred for medial admission.      Review of Systems:  Constitutional: No Weight Loss, No Weight Gain, Night Sweats, Fevers, Chills, Dizziness, Light Headedness, Fatigue, or Generalized Weakness HEENT: No Headaches, Difficulty Swallowing,Tooth/Dental Problems,Sore Throat,  No Sneezing, Rhinitis, Ear Ache, Nasal Congestion, or Post Nasal Drip,  Cardio-vascular:  +Chest pain, Orthopnea, PND, Edema in Lower Extremities, Anasarca, Dizziness, Palpitations  Resp: No Dyspnea, No DOE, No Productive Cough, No Non-Productive Cough, No Hemoptysis, No Wheezing.    GI: No Heartburn, Indigestion, Abdominal Pain, Nausea, Vomiting, Diarrhea, Constipation, Hematemesis, Hematochezia, Melena, Change in Bowel Habits,  Loss of Appetite  GU:  +Dysuria, No Change in Color of Urine, No Urgency or Urinary Frequency, No Flank pain.  Musculoskeletal: +Joint Pain, No Decreased Range of Motion, No Back Pain.  Neurologic: No Syncope, No Seizures, Muscle Weakness, Paresthesia, Vision Disturbance or Loss, No Diplopia, No Vertigo, No Difficulty Walking,  Skin: No Rash or Lesions. Psych: No Change in Mood or Affect, No Depression or Anxiety, No Memory loss, No Confusion, or Hallucinations   Past Medical History  Diagnosis Date  . Sickle cell anemia (HCC)   . Anemia        Past Surgical History  Procedure Laterality Date  . Tubal ligation    . Cholecystectomy    . Cesarean section    . Port a cath placement Right     about 6-7 years ago  . Cholecystectomy  2000  . Multiple tooth extractions N/A       Prior to Admission medications   Medication Sig Start Date End Date Taking? Authorizing Provider  DULoxetine (CYMBALTA) 60 MG capsule Take 1 capsule (60 mg total) by mouth daily. 01/08/15  Yes Quentin Angst, MD  folic acid (FOLVITE) 1 MG tablet Take 1 tablet (1 mg total) by mouth daily. 01/08/15  Yes Quentin Angst, MD  hydroxyurea (HYDREA) 500 MG capsule TAKE 2 CAPSULES BY MOUTH DAILY. MAY TAKE WITH FOOD TO MINIMIZE GI SIDE EFFECTS. 06/26/15  Yes Quentin Angst, MD  ibuprofen (ADVIL,MOTRIN) 200 MG tablet Take 200 mg by mouth every 6 (six) hours as needed for moderate pain.   Yes Historical Provider, MD  morphine (MS CONTIN) 30 MG 12 hr tablet Take 1 tablet (30 mg total) by mouth every 12 (twelve) hours. 07/05/15  Yes Quentin Angst, MD  oxyCODONE-acetaminophen (PERCOCET) 10-325 MG tablet Take 1 tablet by mouth every 6 (six) hours as needed for pain. 07/05/15  Yes Quentin Angst, MD  potassium chloride SA (K-DUR,KLOR-CON) 20 MEQ tablet TAKE 1 TABLET BY MOUTH DAILY. 06/26/15  Yes Quentin Angst, MD  Topiramate ER 100 MG CP24 Take 100 mg by mouth at bedtime.   Yes Historical Provider, MD  Cholecalciferol (VITAMIN  D3) 5000 UNITS CAPS Take 1 capsule (5,000 Units total) by mouth daily. Patient not taking: Reported on 07/12/2015 01/08/15   Quentin Angstlugbemiga E Jegede, MD  gabapentin (NEURONTIN) 300 MG capsule Take 1 capsule (300 mg total) by mouth 3 (three) times daily. Patient not taking: Reported on 07/12/2015 02/14/14   Altha HarmMichelle A [name], MD  potassium chloride SA (K-DUR,KLOR-CON) 20 MEQ tablet Take 1 tablet (20 mEq total) by mouth daily. Patient not taking: Reported on 07/12/2015 01/08/15   Quentin Angstlugbemiga E Jegede, MD     Allergies  Allergen  Reactions  . Ultram [Tramadol] Other (See Comments)    seizures  . Zofran [Ondansetron Hcl] Nausea And Vomiting  . Buprenorphine Hcl Hives and Rash    Shaking Tolerates Percocet, Norco, and buprenorphine  . Morphine And Related Hives, Rash and Other (See Comments)    Shaking Tolerates Percocet, Norco, and buprenorphine  . Tape Rash    Social History:  reports that she has quit smoking. She does not have any smokeless tobacco history on file. She reports that she does not drink alcohol or use illicit drugs.    Family History  Problem Relation Age of Onset  . Sickle cell anemia Other   . Sickle cell trait Father   . Sickle cell trait Mother        Physical Exam:  GEN: Pleasant Thin  29 y.o. African American female examined and in Discomfort but no acute distress; cooperative with exam Filed Vitals:   07/12/15 1701 07/12/15 1749 07/12/15 1837 07/12/15 1913  BP: 108/82 110/84 109/77   Pulse: 109 96 96 96  Temp: 98.7 F (37.1 C)     TempSrc: Oral     Resp: 16 16 18 15   Weight:  55.339 kg (122 lb)    SpO2: 95% 96% 98% 96%   Blood pressure 109/77, pulse 96, temperature 98.7 F (37.1 C), temperature source Oral, resp. rate 15, weight 55.339 kg (122 lb), last menstrual period 07/07/2015, SpO2 96 %. PSYCH: She is alert and oriented x4; does not appear anxious does not appear depressed; affect is normal HEENT: Normocephalic and Atraumatic, Mucous membranes pink; PERRLA; EOM intact; Fundi:  Benign;  No scleral icterus, Nares: Patent, Oropharynx: Clear, Fair Dentition,    Neck:  FROM, No Cervical Lymphadenopathy nor Thyromegaly or Carotid Bruit; No JVD; Breasts:: Not examined CHEST WALL: No tenderness CHEST: Normal respiration, clear to auscultation bilaterally HEART: Regular rate and rhythm; no murmurs rubs or gallops BACK: No kyphosis or scoliosis; No CVA tenderness ABDOMEN: Positive Bowel Sounds, Scaphoid, Soft Non-Tender, No Rebound or Guarding; No Masses, No  Organomegaly Rectal Exam: Not done EXTREMITIES: No Cyanosis, Clubbing, or Edema; No Ulcerations. Genitalia: not examined PULSES: 2+ and symmetric SKIN: Normal hydration no rash or ulceration CNS:  Alert and Oriented x 4, No Focal Deficits Vascular: pulses palpable throughout    Labs on Admission:  Basic Metabolic Panel:  Recent Labs Lab 07/12/15 1729  NA 140  K 3.4*  CL 107  CO2 24  GLUCOSE 129*  BUN 6  CREATININE 0.41*  CALCIUM 9.2   Liver Function Tests:  Recent Labs Lab 07/12/15 1729  AST 50*  ALT 32  ALKPHOS 93  BILITOT 2.1*  PROT 7.4  ALBUMIN 4.3   No results for input(s): LIPASE, AMYLASE in the last 168 hours. No results for input(s): AMMONIA in the last 168 hours. CBC:  Recent Labs Lab 07/12/15 1729  WBC 13.0*  NEUTROABS 8.3*  HGB 8.9*  HCT 25.8*  MCV 92.1  PLT 400   Cardiac Enzymes: No results for input(s): CKTOTAL, CKMB, CKMBINDEX, TROPONINI in the last 168 hours.  BNP (last 3 results) No results for input(s): BNP in the last 8760 hours.  ProBNP (last 3 results) No results for input(s): PROBNP in the last 8760 hours.  CBG: No results for input(s): GLUCAP in the last 168 hours.  Radiological Exams on Admission: Dg Chest 2 View  07/12/2015  CLINICAL DATA:  Sickle cell crisis.  Chest pain for 1 week. EXAM: CHEST - 2 VIEW COMPARISON:  Two-view chest x-ray 03/07/2015 and 03/03/2015 FINDINGS: The heart size is normal. No significant edema or effusion is present to suggest failure. No focal airspace disease is present. A right-sided Port-A-Cath is again seen. The catheter has changed position. The tip appears to be within the azygos vein. IMPRESSION: 1. Altered position of Port-A-Cath. The tip is suspected to be in the as azygous vein. If the catheter has been replaced since 03/07/15, I cannot exclude arterial positioning. CT or CTA of the chest would be useful for further evaluation. If the catheter has not been manipulated since January, follow-up  with interventional radiology is recommended for catheter repositioning. These results were called by telephone at the time of interpretation on 07/12/2015 at 6:07 pm to Dr. Carlena Sax, who verbally acknowledged these results. Electronically Signed   By: Marin Roberts M.D.   On: 07/12/2015 18:08   Ct Angio Chest Pe W/cm &/or Wo Cm  07/12/2015  CLINICAL DATA:  Generalized weakness.  Sickle cell disease. EXAM: CT ANGIOGRAPHY CHEST WITH CONTRAST TECHNIQUE: Multidetector CT imaging of the chest was performed using the standard protocol during bolus administration of intravenous contrast. Multiplanar CT image reconstructions and MIPs were obtained to evaluate the vascular anatomy. CONTRAST:  100 mL Isovue 370 intravenous COMPARISON:  Radiographs 07/12/2015 FINDINGS: Cardiovascular: There is good opacification of the pulmonary arteries. There is no pulmonary embolism. The thoracic aorta is normal in caliber and intact. The right subclavian central line enters the SVC and continues posteriorly in the azygos vein several cm back to the midline prevertebral portion of the vein. Lungs: There is stable scarring in the right lateral lung base, unchanged from 11/01/2014. The lungs are otherwise clear. Central airways: Normal Effusions: None Lymphadenopathy: None Esophagus: Unremarkable Upper abdomen: No significant abnormality Musculoskeletal: Scattered sclerosis and numerous skeletal structures, consistent with the described history of sickle cell disease. Review of the MIP images confirms the above findings. IMPRESSION: 1. No acute findings are evident in the chest. 2. The right subclavian central line enters the SVC and and continues posteriorly in the azygos vein. Consider interventional radiology consultation for catheter repositioning. 3. These results will be called to the ordering clinician or representative by the Radiologist Assistant, and communication documented in the PACS or zVision Dashboard.  Electronically Signed   By: Ellery Plunk M.D.   On: 07/12/2015 21:15     EKG: Independently reviewed.       Assessment/Plan:     30 y.o. female with  Principal Problem:    Sickle cell pain crisis (HCC)   PCA Dilaudid for Pain   IVFs   Active Problems:    Anemia   Monitor Hb Trend        Chest pain   Cardiac Monitoring      Leukocytosis   Monitor Trend    Hypokalemia- K+ = 3.4   Replace K+   Check Magnesium    Port A Cath Problem   IR consult for evaluation  DVT Prophylaxis   Lovenox      Code Status:     FULL CODE   Family Communication:   No Family Present    Disposition Plan:    Inpatient   Status        Time spent:  69 Minutes      Ron Parker [facility] Pager [phone]   If 7AM -7PM Please Contact the Day Rounding Team MD for [facility]  If 7PM-7AM, Please Contact Night-Floor Coverage  [email or web address] Password [email or web address] 07/12/2015, 10:08 PM     ADDENDUM:   Patient was seen and examined on 07/12/2015

## 2015-07-12 NOTE — Progress Notes (Signed)
[facility]. Unable to get IV access, so pt received SQ and IM pain med. Pt lstated she was going to the emergency department. Pt was seen by Dr. Hyman HopesJegede prior to leaving the unit. Pt was alert, oriented and ambulatory at discharge.

## 2015-07-12 NOTE — Telephone Encounter (Signed)
Called complaining of pain in "legs and back". Rating pain at 8/10. Stating "Had my teeth pulled on Tuesday so I don't know if that set me in a crisis or not". Denies fever, chest pain, abdominal pain, N/V/diarrhea. Took oxycodone 10 mg and MS Contin 30 mg at 7 am without relief. Will let provider know and call her back, she understands.

## 2015-07-12 NOTE — ED Notes (Addendum)
Pt reports sickle cell crisis with cp x 1 week.  Was seen at the sickle cell clinic today for same and was sent here d/t cp.  Pt also reports SOB, dizziness and n/v.  Pt is NAD.  Pt is A&Ox 4.  Pt's power-port accessed.  Pt states her PAC has not been giving blood since she had it placed in February.

## 2015-07-12 NOTE — Progress Notes (Signed)
Not able to obtain peripheral IV, provider notified and orders received.

## 2015-07-12 NOTE — Telephone Encounter (Signed)
L. [name] NP adv[facility] of [facility]. Patient notified and will be on her way in.. Patient says that she will call the office for appointment for port a cath assessment since her teeth have been pulled, as the teeth were a priority.

## 2015-07-12 NOTE — Discharge Summary (Signed)
[facility] Discharge [facility]   Patient ID: Brittany CampbellMiranda Worthington MRN: [id number] DOB/AGE: 31/03/1985 29 y.o.  Admit date: 07/12/2015 Discharge date: 07/12/2015  Primary Care Physician:  Jeanann LewandowskyJEGEDE, OLUGBEMIGA, MD  Admission Diagnoses:  Active Problems:   Sickle cell anemia with pain [facility](HCC)   Discharge Medications:    Medication List    ASK your doctor about these medications        DULoxetine 60 MG capsule  Commonly known as:  CYMBALTA  Take 1 capsule (60 mg total) by mouth daily.     folic acid 1 MG tablet  Commonly known as:  FOLVITE  Take 1 tablet (1 mg total) by mouth daily.     gabapentin 300 MG capsule  Commonly known as:  NEURONTIN  Take 1 capsule (300 mg total) by mouth 3 (three) times daily.     hydroxyurea 500 MG capsule  Commonly known as:  HYDREA  TAKE 2 CAPSULES BY MOUTH DAILY. MAY TAKE WITH FOOD TO MINIMIZE GI SIDE EFFECTS.     ibuprofen 200 MG tablet  Commonly known as:  ADVIL,MOTRIN  Take 200 mg by mouth every 6 (six) hours as needed for moderate pain.     morphine 30 MG 12 hr tablet  Commonly known as:  MS CONTIN  Take 1 tablet (30 mg total) by mouth every 12 (twelve) hours.     oxyCODONE-acetaminophen 10-325 MG tablet  Commonly known as:  PERCOCET  Take 1 tablet by mouth every 6 (six) hours as needed for pain.     potassium chloride SA 20 MEQ tablet  Commonly known as:  K-DUR,KLOR-CON  Take 1 tablet (20 mEq total) by mouth daily.     potassium chloride SA 20 MEQ tablet  Commonly known as:  K-DUR,KLOR-CON  TAKE 1 TABLET BY MOUTH DAILY.     Topiramate ER 100 MG Cp24  Take 100 mg by mouth at bedtime.     Vitamin D3 5000 units Caps  Take 1 capsule (5,000 Units total) by mouth daily.         Consults:  None  Significant Diagnostic Studies:  No results found.   [facility] Course: Patient was admitted to the day infusion center for extended observation.  Patient has poor venous access; no blood return from port a  cath. IV team notified, unable to start IV after multiple attempts.  Patient received subcutaneous dilaudid 6 mg with minimal relief.  Patient was given Toradol 30 mg IM for inflammation She was is tolerating oral fluids. Patient had 7 teeth extracted on 07/09/2015.  Recommend that patient continue hydration at home and oral pain medications Follow up with primary provider as scheduled Also, follow up with dentist as scheduled. Placed order for outpatient port removal and port-a-cath placement  The patient was given clear instructions to go to ER or return to medical center if symptoms do not improve, worsen or new problems develop. The patient verbalized understanding.  Physical Exam at Discharge:    BP 112/69 mmHg  Pulse 103  Temp(Src) 99.2 F (37.3 C)  Resp 16  Ht 5' (1.524 m)  Wt 122 lb (55.339 kg)  BMI 23.83 kg/m2  SpO2 97%  General Appearance:    Alert, cooperative, mild distress, appears stated age  Head:    Normocephalic, without obvious abnormality, atraumatic  Lungs:     Clear to auscultation bilaterally, respirations unlabored  Chest Wall:    No tenderness or deformity   Heart:    Regular rate and  rhythm, S1 and S2 normal, no murmur, rub   or gallop     Abdomen:     Soft, non-tender, bowel sounds active all four quadrants,    no masses, no organomegaly  Extremities:   Extremities normal, atraumatic, no cyanosis or edema  Pulses:   2+ and symmetric all extremities  Skin:   Skin color, texture, turgor normal, no rashes or lesions   Disposition at Discharge: 01-Home or Self Care  Discharge Orders:   Condition at Discharge:   Stable  Time spent on Discharge:  15 minutes  Signed: Paiton Boultinghouse M 07/12/2015, 4:05 PM

## 2015-07-12 NOTE — ED Notes (Signed)
See triage note.

## 2015-07-12 NOTE — ED Notes (Signed)
In Ct  

## 2015-07-12 NOTE — Progress Notes (Addendum)
Attempting venous access due to port a cath not verified as patent. Access obtained but infiltration noted. IV team consulted.

## 2015-07-12 NOTE — ED Notes (Signed)
Patient transported to X-ray 

## 2015-07-12 NOTE — ED Provider Notes (Signed)
Patient with history of Sickle Cell Disease presents [facility] with CP, SOB (typical pain is back, hip, thigh). CXR neg for pneumonia but portacath ?arterial vs venous. CTA pending to evaluate catheter placement and chest pain. [place] in February used for medication administration but has never been able to draw blood from it.   CTA negative for PE, PNA. Shows catheter passes through SVC to azygos and suggests repositioning via IR. On re-evaluation, patient still having significant pain despite IV pain medications x 3. Discussed with [facility], Dr. Lovell SheehanJenkins, who accepts for admission. Dr. Lovell SheehanJenkins made aware of abnormal placement of catheter.  Elpidio AnisShari Hildreth Orsak, PA-C 07/12/15 2215  Alvira MondayErin Schlossman, MD 07/15/15 [phone]

## 2015-07-13 DIAGNOSIS — D72829 Elevated white blood cell count, unspecified: Secondary | ICD-10-CM

## 2015-07-13 LAB — CBC WITH DIFFERENTIAL/PLATELET
BASOS ABS: 0 10*3/uL (ref 0.0–0.1)
Basophils Relative: 0 %
EOS ABS: 0.2 10*3/uL (ref 0.0–0.7)
EOS PCT: 2 %
HCT: 23 % — ABNORMAL LOW (ref 36.0–46.0)
Hemoglobin: 7.8 g/dL — ABNORMAL LOW (ref 12.0–15.0)
LYMPHS PCT: 45 %
Lymphs Abs: 5.1 10*3/uL — ABNORMAL HIGH (ref 0.7–4.0)
MCH: 31.6 pg (ref 26.0–34.0)
MCHC: 33.9 g/dL (ref 30.0–36.0)
MCV: 93.1 fL (ref 78.0–100.0)
MONO ABS: 1.3 10*3/uL — AB (ref 0.1–1.0)
Monocytes Relative: 12 %
Neutro Abs: 4.8 10*3/uL (ref 1.7–7.7)
Neutrophils Relative %: 42 %
PLATELETS: 352 10*3/uL (ref 150–400)
RBC: 2.47 MIL/uL — AB (ref 3.87–5.11)
RDW: 26.9 % — AB (ref 11.5–15.5)
WBC: 11.4 10*3/uL — ABNORMAL HIGH (ref 4.0–10.5)

## 2015-07-13 LAB — BASIC METABOLIC PANEL
ANION GAP: 9 (ref 5–15)
CALCIUM: 8.7 mg/dL — AB (ref 8.9–10.3)
CO2: 22 mmol/L (ref 22–32)
Chloride: 109 mmol/L (ref 101–111)
Creatinine, Ser: 0.37 mg/dL — ABNORMAL LOW (ref 0.44–1.00)
GFR calc Af Amer: >60 mL/min (ref >60)
GLUCOSE: 137 mg/dL — AB (ref 65–99)
POTASSIUM: 3.9 mmol/L (ref 3.5–5.1)
SODIUM: 140 mmol/L (ref 135–145)

## 2015-07-13 LAB — TROPONIN I
Troponin I: <0.03 ng/mL (ref <0.031)
Troponin I: <0.03 ng/mL (ref <0.031)

## 2015-07-13 MED ORDER — HYDROMORPHONE 1 MG/ML IV SOLN: INTRAVENOUS | Status: DC

## 2015-07-13 MED ORDER — SODIUM CHLORIDE 0.9% FLUSH
10.0000 mL | Freq: Two times a day (BID) | INTRAVENOUS | Status: DC
  Administered 2015-07-16 – 2015-07-19 (×5): 10 mL

## 2015-07-13 MED ORDER — SODIUM CHLORIDE 0.9% FLUSH: 9.0000 mL | INTRAVENOUS | Status: DC | PRN

## 2015-07-13 MED ORDER — HYDROMORPHONE 1 MG/ML IV SOLN
INTRAVENOUS | Status: DC
  Administered 2015-07-13: 6.6 mg via INTRAVENOUS
  Administered 2015-07-13: 10.2 mg via INTRAVENOUS
  Administered 2015-07-13: 19:00:00 via INTRAVENOUS
  Administered 2015-07-14: 11.5 mg via INTRAVENOUS
  Administered 2015-07-14: 1 mg via INTRAVENOUS
  Administered 2015-07-14: 7 mg via INTRAVENOUS
  Administered 2015-07-14: 6.5 mg via INTRAVENOUS
  Administered 2015-07-14: 3.5 mg via INTRAVENOUS
  Administered 2015-07-14: 2 mg via INTRAVENOUS
  Administered 2015-07-15: 18:00:00 via INTRAVENOUS
  Administered 2015-07-15: 8.5 mg via INTRAVENOUS
  Administered 2015-07-15: 5.5 mg via INTRAVENOUS
  Administered 2015-07-15: 7 mg via INTRAVENOUS
  Administered 2015-07-15: 6 mg via INTRAVENOUS
  Administered 2015-07-15: 01:00:00 via INTRAVENOUS
  Administered 2015-07-15: 7 mg via INTRAVENOUS
  Administered 2015-07-16: 5.5 mg via INTRAVENOUS
  Administered 2015-07-16: 10:00:00 via INTRAVENOUS
  Administered 2015-07-16: 7.5 mg via INTRAVENOUS
  Administered 2015-07-16: 4 mg via INTRAVENOUS
  Administered 2015-07-16: 2 mg via INTRAVENOUS
  Administered 2015-07-16: 6.5 mg via INTRAVENOUS
  Administered 2015-07-16: 5.5 mg via INTRAVENOUS
  Administered 2015-07-17: 4 mg via INTRAVENOUS
  Administered 2015-07-17: 8 mg via INTRAVENOUS
  Administered 2015-07-17: 4.5 mg via INTRAVENOUS
  Administered 2015-07-17: 4 mg via INTRAVENOUS
  Administered 2015-07-17: 0.5 mg via INTRAVENOUS
  Administered 2015-07-17: 8 mg via INTRAVENOUS
  Administered 2015-07-18: 01:00:00 via INTRAVENOUS
  Administered 2015-07-18: 7 mg via INTRAVENOUS
  Administered 2015-07-18: 4.5 mg via INTRAVENOUS
  Administered 2015-07-18: 1.5 mg via INTRAVENOUS
  Administered 2015-07-18: 4 mg via INTRAVENOUS
  Administered 2015-07-18: 7 mg via INTRAVENOUS
  Administered 2015-07-18: 20:00:00 via INTRAVENOUS
  Administered 2015-07-18: 12 mg via INTRAVENOUS
  Administered 2015-07-19: 2 mg via INTRAVENOUS
  Administered 2015-07-19: 8.5 mg via INTRAVENOUS
  Administered 2015-07-19: 6 mg via INTRAVENOUS
  Administered 2015-07-19: 5 mg via INTRAVENOUS
  Filled 2015-07-13 (×9): qty 25

## 2015-07-13 MED ORDER — DIPHENHYDRAMINE HCL 12.5 MG/5ML PO ELIX
12.5000 mg | ORAL_SOLUTION | Freq: Four times a day (QID) | ORAL | Status: DC | PRN
Start: 2015-07-13 — End: 2015-07-19
  Administered 2015-07-18: 12.5 mg via ORAL
  Filled 2015-07-13: qty 5

## 2015-07-13 MED ORDER — SODIUM CHLORIDE 0.9% FLUSH: 10.0000 mL | INTRAVENOUS | Status: DC | PRN

## 2015-07-13 MED ORDER — SODIUM CHLORIDE 0.9 % IV SOLN
12.5000 mg | Freq: Four times a day (QID) | INTRAVENOUS | Status: DC | PRN
  Administered 2015-07-13 – 2015-07-19 (×19): 12.5 mg via INTRAVENOUS
  Filled 2015-07-13 (×43): qty 0.25

## 2015-07-13 MED ORDER — ONDANSETRON HCL 4 MG/2ML IJ SOLN: 4.0000 mg | Freq: Four times a day (QID) | INTRAMUSCULAR | Status: DC | PRN

## 2015-07-13 MED ORDER — NALOXONE HCL 0.4 MG/ML IJ SOLN: 0.4000 mg | INTRAMUSCULAR | Status: DC | PRN

## 2015-07-13 NOTE — Progress Notes (Signed)
IR aware of patient admission and IR request. Patient is already on IR schedule for 5/19 to have existing port removed/replaced as imaging demonstrates catheter tip is malpositioned If pt to remain inpt, will evaluate Monday to see if procedure can be moved up on schedule. If pt to be discharged, recommend keeping port procedure as scheduled. Advise not to use port.  Brayton ElKevin Jaymin Waln PA-C Interventional Radiology 07/13/2015 1:46 PM

## 2015-07-13 NOTE — Progress Notes (Signed)
Patient ID: Brittany Foster Mottern, female   DOB: 03/06/1985, 30 y.o.   MRN: [id number] SICKLE CELL SERVICE PROGRESS NOTE  Brittany Foster Riggle YQM:[id number] DOB: 06/12/1985 DOA: 07/12/2015 PCP: Jeanann LewandowskyJEGEDE, Shaleena Crusoe, MD  Assessment/Plan: Principal Problem:   Sickle cell pain crisis (HCC) Active Problems:   Anemia   Hypokalemia   Chest pain   Leukocytosis   Sickle cell anemia with crisis (HCC)  1. Hb SS with crisis: Patient is complaining of 10/10 pain mostly in her hips. She is opiate tolerant. Will adjust dilaudid PCA to appropriately match her weight. Continue IV toradol and IVF hypotonic solution. 2. Leukocytosis: Most likely related to crisis, no evidence of infection although patient recently had 7 of her teeth extracted. Will continue to monitor. So far, no fever. 3. Anemia of chronic disease: Hb at baseline. Continue to monitor 4. Chronic pain: Restart home medications including cymbalta 5. Hypokalemia: Resolved 6. Port A Cath Displacement: "IR aware of patient admission and IR request. Patient is already on IR schedule for 5/19 to have existing port removed/replaced as imaging demonstrates catheter tip is malpositioned. If pt to remain inpt, will evaluate Monday to see if procedure can be moved up on schedule. If pt to be discharged, recommend keeping port procedure as scheduled. Advise not to use port".  Code Status: Full Code Family Communication: N/A Disposition Plan: For possible discharge tomorrow  Malayla Granberry  If 7PM-7AM, please contact night-coverage.  07/13/2015, 3:40 PM  LOS: 1 day   Admitting History: Brittany Foster Haddon is a 30 y.o. female with a history of Sickle Cell disease who presents to the ED with complaints of 9/10 Pain all over x 4 days and Chest pain and SOB for the past 2 days.She was [facility] and sent to the ED. A Chest X-ray and CTA of the Chest were performed, and were negative for acute findings, however her Port A-Cath was incidentally found to be  displaced.She has no new complaint today except that her pain meds dosage needs adjustment because she thinks she's getting too little dosage of dilaudid, she also wants her promethazine IV instead of PO.  Consultants:  None  Procedures:  None  Antibiotics:  None  Objective: Filed Vitals:   07/13/15 0312 07/13/15 0412 07/13/15 0540 07/13/15 1500  BP: 107/72 98/64 98/74  101/73  Pulse: 96 101 96 94  Temp:   98 F (36.7 C) 99.2 F (37.3 C)  TempSrc:   Oral Oral  Resp: 17 15 17 14   Height:      Weight:      SpO2: 98% 96% 96% 98%   Weight change:   Intake/Output Summary (Last 24 hours) at 07/13/15 1540 Last data filed at 07/13/15 0600  Gross per 24 hour  Intake   1750 ml  Output      0 ml  Net   1750 ml    General: Alert, awake, oriented x3, in no acute distress.  HEENT: [place]/AT PEERL, EOMI Neck: Trachea midline,  no masses, no thyromegal,y no JVD, no carotid bruit OROPHARYNX:  Moist, No exudate/ erythema/lesions.  Heart: Regular rate and rhythm, without murmurs, rubs, gallops, PMI non-displaced, no heaves or thrills on palpation.  Lungs: Clear to auscultation, no wheezing or rhonchi noted. No increased vocal fremitus resonant to percussion  Abdomen: Soft, nontender, nondistended, positive bowel sounds, no masses no hepatosplenomegaly noted..  Neuro: No focal neurological deficits noted cranial nerves II through XII grossly intact. DTRs 2+ bilaterally upper and lower extremities. Strength 5 out of 5  in bilateral upper and lower extremities. Musculoskeletal: No warm swelling or erythema around joints, no spinal tenderness noted. Psychiatric: Patient alert and oriented x3, good insight and cognition, good recent to remote recall. Lymph node survey: No cervical axillary or inguinal lymphadenopathy noted.   Data Reviewed: Basic Metabolic Panel:  Recent Labs Lab 07/12/15 1729 07/12/15 2328 07/13/15 0634  NA 140  --  140  K 3.4*  --  3.9  CL 107  --  109  CO2 24  --   22  GLUCOSE 129*  --  137*  BUN 6  --  <5*  CREATININE 0.41*  --  0.37*  CALCIUM 9.2  --  8.7*  MG  --  2.0  --    Liver Function Tests:  Recent Labs Lab 07/12/15 1729  AST 50*  ALT 32  ALKPHOS 93  BILITOT 2.1*  PROT 7.4  ALBUMIN 4.3   No results for input(s): LIPASE, AMYLASE in the last 168 hours. No results for input(s): AMMONIA in the last 168 hours. CBC:  Recent Labs Lab 07/12/15 1729 07/13/15 0634  WBC 13.0* 11.4*  NEUTROABS 8.3* 4.8  HGB 8.9* 7.8*  HCT 25.8* 23.0*  MCV 92.1 93.1  PLT 400 352   Cardiac Enzymes:  Recent Labs Lab 07/12/15 2328 07/13/15 0634  TROPONINI <0.03 <0.03   BNP (last 3 results) No results for input(s): BNP in the last 8760 hours.  ProBNP (last 3 results) No results for input(s): PROBNP in the last 8760 hours.  CBG: No results for input(s): GLUCAP in the last 168 hours.  No results found for this or any previous visit (from the past 240 hour(s)).   Studies: Dg Chest 2 View  07/12/2015  CLINICAL DATA:  Sickle cell crisis.  Chest pain for 1 week. EXAM: CHEST - 2 VIEW COMPARISON:  Two-view chest x-ray 03/07/2015 and 03/03/2015 FINDINGS: The heart size is normal. No significant edema or effusion is present to suggest failure. No focal airspace disease is present. A right-sided Port-A-Cath is again seen. The catheter has changed position. The tip appears to be within the azygos vein. IMPRESSION: 1. Altered position of Port-A-Cath. The tip is suspected to be in the as azygous vein. If the catheter has been replaced since 03/07/15, I cannot exclude arterial positioning. CT or CTA of the chest would be useful for further evaluation. If the catheter has not been manipulated since January, follow-up with interventional radiology is recommended for catheter repositioning. These results were called by telephone at the time of interpretation on 07/12/2015 at 6:07 pm to Dr. Carlena Sax, who verbally acknowledged these results. Electronically Signed    By: Marin Roberts M.D.   On: 07/12/2015 18:08   Ct Angio Chest Pe W/cm &/or Wo Cm  07/12/2015  CLINICAL DATA:  Generalized weakness.  Sickle cell disease. EXAM: CT ANGIOGRAPHY CHEST WITH CONTRAST TECHNIQUE: Multidetector CT imaging of the chest was performed using the standard protocol during bolus administration of intravenous contrast. Multiplanar CT image reconstructions and MIPs were obtained to evaluate the vascular anatomy. CONTRAST:  100 mL Isovue 370 intravenous COMPARISON:  Radiographs 07/12/2015 FINDINGS: Cardiovascular: There is good opacification of the pulmonary arteries. There is no pulmonary embolism. The thoracic aorta is normal in caliber and intact. The right subclavian central line enters the SVC and continues posteriorly in the azygos vein several cm back to the midline prevertebral portion of the vein. Lungs: There is stable scarring in the right lateral lung base, unchanged from 11/01/2014. The lungs are otherwise  clear. Central airways: Normal Effusions: None Lymphadenopathy: None Esophagus: Unremarkable Upper abdomen: No significant abnormality Musculoskeletal: Scattered sclerosis and numerous skeletal structures, consistent with the described history of sickle cell disease. Review of the MIP images confirms the above findings. IMPRESSION: 1. No acute findings are evident in the chest. 2. The right subclavian central line enters the SVC and and continues posteriorly in the azygos vein. Consider interventional radiology consultation for catheter repositioning. 3. These results will be called to the ordering clinician or representative by the Radiologist Assistant, and communication documented in the PACS or zVision Dashboard. Electronically Signed   By: Ellery Plunk M.D.   On: 07/12/2015 21:15    Scheduled Meds: . DULoxetine  60 mg Oral Daily  . enoxaparin (LOVENOX) injection  40 mg Subcutaneous QHS  . folic acid  1 mg Oral Daily  . HYDROmorphone   Intravenous Q4H  .  hydroxyurea  1,000 mg Oral Daily  . ketorolac  30 mg Intravenous Q6H  . potassium chloride SA  20 mEq Oral Daily  . senna-docusate  1 tablet Oral BID  . topiramate  50 mg Oral BID   Continuous Infusions: . sodium chloride 150 mL/hr at 07/13/15 1610    Principal Problem:   Sickle cell pain crisis [facility]) Active Problems:   Anemia   Hypokalemia   Chest pain   Leukocytosis   Sickle cell anemia with crisis (HCC)

## 2015-07-14 LAB — COMPREHENSIVE METABOLIC PANEL
ALBUMIN: 3.4 g/dL — AB (ref 3.5–5.0)
ALT: 27 U/L (ref 14–54)
ANION GAP: 6 (ref 5–15)
AST: 45 U/L — ABNORMAL HIGH (ref 15–41)
Alkaline Phosphatase: 79 U/L (ref 38–126)
BUN: 9 mg/dL (ref 6–20)
CHLORIDE: 111 mmol/L (ref 101–111)
CO2: 21 mmol/L — AB (ref 22–32)
Calcium: 8.7 mg/dL — ABNORMAL LOW (ref 8.9–10.3)
Creatinine, Ser: 0.46 mg/dL (ref 0.44–1.00)
GFR calc Af Amer: >60 mL/min (ref >60)
GFR calc non Af Amer: >60 mL/min (ref >60)
GLUCOSE: 100 mg/dL — AB (ref 65–99)
POTASSIUM: 4.2 mmol/L (ref 3.5–5.1)
SODIUM: 138 mmol/L (ref 135–145)
TOTAL PROTEIN: 6.2 g/dL — AB (ref 6.5–8.1)
Total Bilirubin: 1.9 mg/dL — ABNORMAL HIGH (ref 0.3–1.2)

## 2015-07-14 LAB — CBC WITH DIFFERENTIAL/PLATELET
BASOS ABS: 0.1 10*3/uL (ref 0.0–0.1)
BASOS PCT: 1 %
EOS PCT: 5 %
Eosinophils Absolute: 0.6 10*3/uL (ref 0.0–0.7)
HCT: 20.6 % — ABNORMAL LOW (ref 36.0–46.0)
HEMOGLOBIN: 7.1 g/dL — AB (ref 12.0–15.0)
LYMPHS PCT: 29 %
Lymphs Abs: 3.5 10*3/uL (ref 0.7–4.0)
MCH: 31.4 pg (ref 26.0–34.0)
MCHC: 34.5 g/dL (ref 30.0–36.0)
MCV: 91.2 fL (ref 78.0–100.0)
MONOS PCT: 9 %
Monocytes Absolute: 1.1 10*3/uL — ABNORMAL HIGH (ref 0.1–1.0)
NEUTROS ABS: 6.9 10*3/uL (ref 1.7–7.7)
Neutrophils Relative %: 56 %
PLATELETS: 314 10*3/uL (ref 150–400)
RBC: 2.26 MIL/uL — ABNORMAL LOW (ref 3.87–5.11)
RDW: 26.4 % — ABNORMAL HIGH (ref 11.5–15.5)
WBC: 12.2 10*3/uL — ABNORMAL HIGH (ref 4.0–10.5)

## 2015-07-14 NOTE — Progress Notes (Signed)
Utilization review completed.  

## 2015-07-14 NOTE — Progress Notes (Signed)
Patient ID: Brittany Foster, female   DOB: [birth year], 30 y.o.   MRN: [id number] SICKLE CELL SERVICE PROGRESS NOTE  Brittany Foster [id number] DOB: 08/29/1985 DOA: 07/12/2015 PCP: Jeanann Lewandowsky, MD  Assessment/Plan: Principal Problem:   Sickle cell pain crisis (HCC) Active Problems:   Anemia   Hypokalemia   Chest pain   Leukocytosis   Sickle cell anemia with crisis (HCC)  1. Hb SS with crisis: Patient said her pain is the same and mostly in her hips and back. No objective evidence. She is opiate tolerant. Will continue dilaudid PCA and add clinician assisted doses. Continue IV toradol and IVF hypotonic solution. 2. Leukocytosis: Most likely related to crisis, no evidence of infection although patient recently had 7 of her teeth extracted. Will continue to monitor. So far, no fever. 3. Anemia of chronic disease: Hb dropped to 7.1 today, her baseline is around 8. Continue to monitor 4. Chronic pain: On home medications including cymbalta 5. Hypokalemia: Resolved 6. Port A Cath Displacement: "IR aware of patient admission and IR request. Patient is already on IR schedule for 5/19 to have existing port removed/replaced as imaging demonstrates catheter tip is malpositioned. If pt to remain inpt, will evaluate Monday to see if procedure can be moved up on schedule. If pt to be discharged, recommend keeping port procedure as scheduled. Advise not to use port". Patient will be kept NPO post midnight tonight for possible IR evaluation tomorrow.  Code Status: Full Code Family Communication: N/A Disposition Plan: Not yet ready for discharge  Brittany Foster  If 7PM-7AM, please contact night-coverage.  07/14/2015, 3:03 PM  LOS: 2 days   Admitting History: Ongoing pain, no improvement yet per patient. She will get IR evaluation of her Port A [employer]. NPO post midnight tonight. No fever. No chest pain  Consultants:  IR  Procedures:  Port A Cath evaluation and  management  Antibiotics:  None  Objective: Filed Vitals:   07/14/15 0356 07/14/15 0358 07/14/15 0800 07/14/15 1111  BP:   97/57   Pulse:   80   Temp:   98.1 F (36.7 C)   TempSrc:   Oral   Resp: Height:      Weight:      SpO2: 92% 92% 98% 95%   Weight change:   Intake/Output Summary (Last 24 hours) at 07/14/15 1503 Last data filed at 07/14/15 0644  Gross per 24 hour  Intake   1980 ml  Output      0 ml  Net   1980 ml    General: Alert, awake, oriented x3, in no acute distress.  HEENT: [place]/AT PEERL, EOMI Neck: Trachea midline,  no masses, no thyromegal,y no JVD, no carotid bruit OROPHARYNX:  Moist, No exudate/ erythema/lesions.  Heart: Regular rate and rhythm, without murmurs, rubs, gallops, PMI non-displaced, no heaves or thrills on palpation.  Lungs: Clear to auscultation, no wheezing or rhonchi noted. No increased vocal fremitus resonant to percussion  Abdomen: Soft, nontender, nondistended, positive bowel sounds, no masses no hepatosplenomegaly noted..  Neuro: No focal neurological deficits noted cranial nerves II through XII grossly intact. DTRs 2+ bilaterally upper and lower extremities. Strength 5 out of 5 in bilateral upper and lower extremities. Musculoskeletal: No warm swelling or erythema around joints, no spinal tenderness noted. Psychiatric: Patient alert and oriented x3, good insight and cognition, good recent to remote recall. Lymph node survey: No cervical axillary or inguinal lymphadenopathy noted.   Data Reviewed: Basic Metabolic Panel:  Recent  Labs Lab 07/12/15 1729 07/12/15 2328 07/13/15 0634 07/14/15 0512  NA 140  --  140 138  K 3.4*  --  3.9 4.2  CL 107  --  109 111  CO2 24  --  22 21*  GLUCOSE 129*  --  137* 100*  BUN 6  --  <5* 9  CREATININE 0.41*  --  0.37* 0.46  CALCIUM 9.2  --  8.7* 8.7*  MG  --  2.0  --   --    Liver Function Tests:  Recent Labs Lab 07/12/15 1729 07/14/15 0512  AST 50* 45*  ALT 32 27  ALKPHOS  93 79  BILITOT 2.1* 1.9*  PROT 7.4 6.2*  ALBUMIN 4.3 3.4*   No results for input(s): LIPASE, AMYLASE in the last 168 hours. No results for input(s): AMMONIA in the last 168 hours. CBC:  Recent Labs Lab 07/12/15 1729 07/13/15 0634 07/14/15 0512  WBC 13.0* 11.4* 12.2*  NEUTROABS 8.3* 4.8 6.9  HGB 8.9* 7.8* 7.1*  HCT 25.8* 23.0* 20.6*  MCV 92.1 93.1 91.2  PLT 400 352 314   Cardiac Enzymes:  Recent Labs Lab 07/12/15 2328 07/13/15 0634  TROPONINI <0.03 <0.03   BNP (last 3 results) No results for input(s): BNP in the last 8760 hours.  ProBNP (last 3 results) No results for input(s): PROBNP in the last 8760 hours.  CBG: No results for input(s): GLUCAP in the last 168 hours.  No results found for this or any previous visit (from the past 240 hour(s)).   Studies: Dg Chest 2 View  07/12/2015  CLINICAL DATA:  Sickle cell crisis.  Chest pain for 1 week. EXAM: CHEST - 2 VIEW COMPARISON:  Two-view chest x-ray 03/07/2015 and 03/03/2015 FINDINGS: The heart size is normal. No significant edema or effusion is present to suggest failure. No focal airspace disease is present. A right-sided Port-A-Cath is again seen. The catheter has changed position. The tip appears to be within the azygos vein. IMPRESSION: 1. Altered position of Port-A-Cath. The tip is suspected to be in the as azygous vein. If the catheter has been replaced since 03/07/15, I cannot exclude arterial positioning. CT or CTA of the chest would be useful for further evaluation. If the catheter has not been manipulated since January, follow-up with interventional radiology is recommended for catheter repositioning. These results were called by telephone at the time of interpretation on 07/12/2015 at 6:07 pm to Dr. Carlena SaxSchlesinger, who verbally acknowledged these results. Electronically Signed   By: Marin Robertshristopher  Mattern M.D.   On: 07/12/2015 18:08   Ct Angio Chest Pe W/cm &/or Wo Cm  07/12/2015  CLINICAL DATA:  Generalized weakness.   Sickle cell disease. EXAM: CT ANGIOGRAPHY CHEST WITH CONTRAST TECHNIQUE: Multidetector CT imaging of the chest was performed using the standard protocol during bolus administration of intravenous contrast. Multiplanar CT image reconstructions and MIPs were obtained to evaluate the vascular anatomy. CONTRAST:  100 mL Isovue 370 intravenous COMPARISON:  Radiographs 07/12/2015 FINDINGS: Cardiovascular: There is good opacification of the pulmonary arteries. There is no pulmonary embolism. The thoracic aorta is normal in caliber and intact. The right subclavian central line enters the SVC and continues posteriorly in the azygos vein several cm back to the midline prevertebral portion of the vein. Lungs: There is stable scarring in the right lateral lung base, unchanged from 11/01/2014. The lungs are otherwise clear. Central airways: Normal Effusions: None Lymphadenopathy: None Esophagus: Unremarkable Upper abdomen: No significant abnormality Musculoskeletal: Scattered sclerosis and numerous skeletal structures, consistent  with the described history of sickle cell disease. Review of the MIP images confirms the above findings. IMPRESSION: 1. No acute findings are evident in the chest. 2. The right subclavian central line enters the SVC and and continues posteriorly in the azygos vein. Consider interventional radiology consultation for catheter repositioning. 3. These results will be called to the ordering clinician or representative by the Radiologist Assistant, and communication documented in the PACS or zVision Dashboard. Electronically Signed   By: Ellery Plunk M.D.   On: 07/12/2015 21:15    Scheduled Meds: . DULoxetine  60 mg Oral Daily  . enoxaparin (LOVENOX) injection  40 mg Subcutaneous QHS  . folic acid  1 mg Oral Daily  . HYDROmorphone   Intravenous Q4H  . hydroxyurea  1,000 mg Oral Daily  . ketorolac  30 mg Intravenous Q6H  . potassium chloride SA  20 mEq Oral Daily  . senna-docusate  1 tablet  Oral BID  . sodium chloride flush  10-40 mL Intracatheter Q12H  . topiramate  50 mg Oral BID   Continuous Infusions: . sodium chloride 150 mL/hr at 07/14/15 1406   Principal Problem:   Sickle cell pain crisis (HCC) Active Problems:   Anemia   Hypokalemia   Chest pain   Leukocytosis   Sickle cell anemia with crisis (HCC)

## 2015-07-15 DIAGNOSIS — D5700 Hb-SS disease with crisis, unspecified: Principal | ICD-10-CM

## 2015-07-15 LAB — CBC WITH DIFFERENTIAL/PLATELET
Basophils Absolute: 0.1 10*3/uL (ref 0.0–0.1)
Basophils Relative: 1 %
EOS PCT: 6 %
Eosinophils Absolute: 0.8 10*3/uL — ABNORMAL HIGH (ref 0.0–0.7)
HEMATOCRIT: 20.8 % — AB (ref 36.0–46.0)
HEMOGLOBIN: 7.3 g/dL — AB (ref 12.0–15.0)
LYMPHS PCT: 31 %
Lymphs Abs: 4 10*3/uL (ref 0.7–4.0)
MCH: 32.3 pg (ref 26.0–34.0)
MCHC: 35.1 g/dL (ref 30.0–36.0)
MCV: 92 fL (ref 78.0–100.0)
MONOS PCT: 9 %
Monocytes Absolute: 1.2 10*3/uL — ABNORMAL HIGH (ref 0.1–1.0)
NEUTROS PCT: 53 %
Neutro Abs: 6.7 10*3/uL (ref 1.7–7.7)
Platelets: 346 10*3/uL (ref 150–400)
RBC: 2.26 MIL/uL — AB (ref 3.87–5.11)
RDW: 25.9 % — ABNORMAL HIGH (ref 11.5–15.5)
WBC: 12.8 10*3/uL — AB (ref 4.0–10.5)

## 2015-07-15 LAB — PROTIME-INR
INR: 1.08 (ref 0.00–1.49)
Prothrombin Time: 14.2 seconds (ref 11.6–15.2)

## 2015-07-15 NOTE — Progress Notes (Signed)
Patient ID: Brittany Foster, female   DOB: 26-Nov-1985, 30 y.o.   MRN: [id number] SICKLE CELL SERVICE PROGRESS NOTE  Vincent Ehrler XBJ:[id number] DOB: [birth year] DOA: 07/12/2015 PCP: Jeanann Lewandowsky, MD  Assessment/Plan: Principal Problem:   Sickle cell pain crisis (HCC) Active Problems:   Anemia   Hypokalemia   Chest pain   Leukocytosis   Sickle cell anemia with crisis (HCC)  1. Port A Cath Displacement: " Patient is already on IR schedule for 5/19 to have existing port removed/replaced as imaging demonstrates catheter tip is malpositioned. She was seen and evauated today and due to leucocytosis, the procedure was adjourned today. 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 Advise not to use port". Patient may proceed with procedure if no worsening of her WBC.  2. Hb SS with crisis: Patient said her pain is still at 10/10 and mostly in her hips and back. No objective evidence. She is opiate tolerant. Will continue dilaudid PCA and clinician assisted doses. Continue IV toradol and IVF hypotonic solution. 3. Leukocytosis: Most likely related to crisis, no evidence of infection although patient recently had 7 of her teeth extracted. Will continue to monitor. So far, no fever. 4. Anemia of chronic disease: Hb dropped to 7.1 today, her baseline is around 8. Continue to monitor 5. Chronic pain: On home medications including cymbalta 6. Hypokalemia: Resolved   Code Status: Full Code Family Communication: N/A Disposition Plan: Not yet ready for discharge  Lessie Manigo,LAWAL  If 7PM-7AM, please contact night-coverage.  07/15/2015, 3:44 PM  LOS: 3 days   Admitting History: Ongoing pain, no improvement yet per patient. She will get IR evaluation of her Port A [employer]. NPO post midnight tonight. No fever. No chest pain  Consultants:  IR  Procedures:  Port A Cath evaluation and management  Antibiotics:  None  Objective: Filed Vitals:   07/15/15 0505  07/15/15 0515 07/15/15 0829 07/15/15 1200  BP: 90/57     Pulse: 88     Temp: 98.3 F (36.8 C)     TempSrc: Oral     Resp: 15 18 13 15   Height:      Weight:      SpO2: 96% 94% 95% 95%   Weight change:   Intake/Output Summary (Last 24 hours) at 07/15/15 1544 Last data filed at 07/15/15 1400  Gross per 24 hour  Intake   2310 ml  Output      0 ml  Net   2310 ml    General: Alert, awake, oriented x3, in no acute distress.  HEENT: [place]/AT PEERL, EOMI Neck: Trachea midline,  no masses, no thyromegal,y no JVD, no carotid bruit OROPHARYNX:  Moist, No exudate/ erythema/lesions.  Heart: Regular rate and rhythm, without murmurs, rubs, gallops, PMI non-displaced, no heaves or thrills on palpation.  Lungs: Clear to auscultation, no wheezing or rhonchi noted. No increased vocal fremitus resonant to percussion  Abdomen: Soft, nontender, nondistended, positive bowel sounds, no masses no hepatosplenomegaly noted..  Neuro: No focal neurological deficits noted cranial nerves II through XII grossly intact. DTRs 2+ bilaterally upper and lower extremities. Strength 5 out of 5 in bilateral upper and lower extremities. Musculoskeletal: No warm swelling or erythema around joints, no spinal tenderness noted. Psychiatric: Patient alert and oriented x3, good insight and cognition, good recent to remote recall. Lymph node survey: No cervical axillary or inguinal lymphadenopathy noted.   Data Reviewed: Basic Metabolic Panel:  Recent Labs Lab 07/12/15 1729 07/12/15 2328 07/13/15 3086 07/14/15 0512  NA  140  --  140 138  K 3.4*  --  3.9 4.2  CL 107  --  109 111  CO2 24  --  22 21*  GLUCOSE 129*  --  137* 100*  BUN 6  --  <5* 9  CREATININE 0.41*  --  0.37* 0.46  CALCIUM 9.2  --  8.7* 8.7*  MG  --  2.0  --   --    Liver Function Tests:  Recent Labs Lab 07/12/15 1729 07/14/15 0512  AST 50* 45*  ALT 32 27  ALKPHOS 93 79  BILITOT 2.1* 1.9*  PROT 7.4 6.2*  ALBUMIN 4.3 3.4*   No results for  input(s): LIPASE, AMYLASE in the last 168 hours. No results for input(s): AMMONIA in the last 168 hours. CBC:  Recent Labs Lab 07/12/15 1729 07/13/15 0634 07/14/15 0512 07/15/15 0422  WBC 13.0* 11.4* 12.2* 12.8*  NEUTROABS 8.3* 4.8 6.9 6.7  HGB 8.9* 7.8* 7.1* 7.3*  HCT 25.8* 23.0* 20.6* 20.8*  MCV 92.1 93.1 91.2 92.0  PLT 400 352 314 346   Cardiac Enzymes:  Recent Labs Lab 07/12/15 2328 07/13/15 0634  TROPONINI <0.03 <0.03   BNP (last 3 results) No results for input(s): BNP in the last 8760 hours.  ProBNP (last 3 results) No results for input(s): PROBNP in the last 8760 hours.  CBG: No results for input(s): GLUCAP in the last 168 hours.  No results found for this or any previous visit (from the past 240 hour(s)).   Studies: Dg Chest 2 View  07/12/2015  CLINICAL DATA:  Sickle cell crisis.  Chest pain for 1 week. EXAM: CHEST - 2 VIEW COMPARISON:  Two-view chest x-ray 03/07/2015 and 03/03/2015 FINDINGS: The heart size is normal. No significant edema or effusion is present to suggest failure. No focal airspace disease is present. A right-sided Port-A-Cath is again seen. The catheter has changed position. The tip appears to be within the azygos vein. IMPRESSION: 1. Altered position of Port-A-Cath. The tip is suspected to be in the as azygous vein. If the catheter has been replaced since 03/07/15, I cannot exclude arterial positioning. CT or CTA of the chest would be useful for further evaluation. If the catheter has not been manipulated since January, follow-up with interventional radiology is recommended for catheter repositioning. These results were called by telephone at the time of interpretation on 07/12/2015 at 6:07 pm to Dr. Carlena Sax, who verbally acknowledged these results. Electronically Signed   By: Marin Roberts M.D.   On: 07/12/2015 18:08   Ct Angio Chest Pe W/cm &/or Wo Cm  07/12/2015  CLINICAL DATA:  Generalized weakness.  Sickle cell disease. EXAM: CT  ANGIOGRAPHY CHEST WITH CONTRAST TECHNIQUE: Multidetector CT imaging of the chest was performed using the standard protocol during bolus administration of intravenous contrast. Multiplanar CT image reconstructions and MIPs were obtained to evaluate the vascular anatomy. CONTRAST:  100 mL Isovue 370 intravenous COMPARISON:  Radiographs 07/12/2015 FINDINGS: Cardiovascular: There is good opacification of the pulmonary arteries. There is no pulmonary embolism. The thoracic aorta is normal in caliber and intact. The right subclavian central line enters the SVC and continues posteriorly in the azygos vein several cm back to the midline prevertebral portion of the vein. Lungs: There is stable scarring in the right lateral lung base, unchanged from 11/01/2014. The lungs are otherwise clear. Central airways: Normal Effusions: None Lymphadenopathy: None Esophagus: Unremarkable Upper abdomen: No significant abnormality Musculoskeletal: Scattered sclerosis and numerous skeletal structures, consistent with the described history  of sickle cell disease. Review of the MIP images confirms the above findings. IMPRESSION: 1. No acute findings are evident in the chest. 2. The right subclavian central line enters the SVC and and continues posteriorly in the azygos vein. Consider interventional radiology consultation for catheter repositioning. 3. These results will be called to the ordering clinician or representative by the Radiologist Assistant, and communication documented in the PACS or zVision Dashboard. Electronically Signed   By: Ellery Plunkaniel R Mitchell M.D.   On: 07/12/2015 21:15    Scheduled Meds: . DULoxetine  60 mg Oral Daily  . enoxaparin (LOVENOX) injection  40 mg Subcutaneous QHS  . folic acid  1 mg Oral Daily  . HYDROmorphone   Intravenous Q4H  . hydroxyurea  1,000 mg Oral Daily  . ketorolac  30 mg Intravenous Q6H  . potassium chloride SA  20 mEq Oral Daily  . senna-docusate  1 tablet Oral BID  . sodium chloride  flush  10-40 mL Intracatheter Q12H  . topiramate  50 mg Oral BID   Continuous Infusions: . sodium chloride 150 mL/hr at 07/15/15 [id number]   Principal Problem:   Sickle cell pain crisis (HCC) Active Problems:   Anemia   Hypokalemia   Chest pain   Leukocytosis   Sickle cell anemia with crisis (HCC)

## 2015-07-15 NOTE — Progress Notes (Signed)
Patient ID: Brittany CampbellMiranda Foster, female   DOB: 07/24/1985, 30 y.o.   MRN: [id number] Aware of request for port manipulation/revision (placed in [place] recently) on pt. Case reviewed by Dr. Bonnielee HaffHoss (IR attending) this morning. Pt was originally scheduled for procedure as OP for 5/19.  Pt continues to be in pain from sickle cell crisis and also has WBC which remains elevated and slightly more so today, ? leukemoid rxn to crisis. Pt AF; no culture data. Dr. Bonnielee HaffHoss prefers that pt recover from crisis and WBC trend downward before undertaking manipulation/possible replacement of port. Please contact him at [phone] with any additional questions. Will cont to monitor. Above d/w pt.

## 2015-07-15 NOTE — Plan of Care (Signed)
Problem: Sensory: Goal: Pain level will decrease with appropriate interventions Outcome: Not Progressing Patient pain level remains at 8

## 2015-07-16 ENCOUNTER — Encounter (HOSPITAL_COMMUNITY): Payer: Self-pay | Admitting: General Surgery

## 2015-07-16 ENCOUNTER — Ambulatory Visit: Payer: Medicaid Other | Admitting: Internal Medicine

## 2015-07-16 DIAGNOSIS — D638 Anemia in other chronic diseases classified elsewhere: Secondary | ICD-10-CM

## 2015-07-16 LAB — CBC WITH DIFFERENTIAL/PLATELET
BASOS ABS: 0 10*3/uL (ref 0.0–0.1)
Basophils Relative: 0 %
EOS ABS: 0.5 10*3/uL (ref 0.0–0.7)
EOS PCT: 5 %
HEMATOCRIT: 19.3 % — AB (ref 36.0–46.0)
HEMOGLOBIN: 6.7 g/dL — AB (ref 12.0–15.0)
LYMPHS PCT: 38 %
Lymphs Abs: 4.1 10*3/uL — ABNORMAL HIGH (ref 0.7–4.0)
MCH: 31.8 pg (ref 26.0–34.0)
MCHC: 34.7 g/dL (ref 30.0–36.0)
MCV: 91.5 fL (ref 78.0–100.0)
MONOS PCT: 9 %
Monocytes Absolute: 1 10*3/uL (ref 0.1–1.0)
NEUTROS PCT: 48 %
Neutro Abs: 5.3 10*3/uL (ref 1.7–7.7)
Platelets: 303 10*3/uL (ref 150–400)
RBC: 2.11 MIL/uL — AB (ref 3.87–5.11)
RDW: 26.7 % — ABNORMAL HIGH (ref 11.5–15.5)
WBC: 10.9 10*3/uL — AB (ref 4.0–10.5)
nRBC: 2 /100 WBC — ABNORMAL HIGH

## 2015-07-16 LAB — RETICULOCYTES
RBC.: 2.09 MIL/uL — AB (ref 3.87–5.11)
RETIC COUNT ABSOLUTE: 309.3 10*3/uL — AB (ref 19.0–186.0)
RETIC CT PCT: 14.8 % — AB (ref 0.4–3.1)

## 2015-07-16 MED ORDER — CEFAZOLIN SODIUM-DEXTROSE 2-4 GM/100ML-% IV SOLN
2.0000 g | INTRAVENOUS | Status: DC
  Filled 2015-07-16: qty 100

## 2015-07-16 MED ORDER — HYDROMORPHONE HCL 1 MG/ML IJ SOLN
1.0000 mg | INTRAMUSCULAR | Status: DC | PRN
  Administered 2015-07-16 – 2015-07-18 (×18): 1 mg via INTRAVENOUS
  Filled 2015-07-16 (×18): qty 1

## 2015-07-16 MED ORDER — CEFAZOLIN SODIUM-DEXTROSE 2-4 GM/100ML-% IV SOLN
2.0000 g | INTRAVENOUS | Status: AC
  Administered 2015-07-17: 2 g via INTRAVENOUS
  Filled 2015-07-16 (×2): qty 100

## 2015-07-16 MED ORDER — ENOXAPARIN SODIUM 40 MG/0.4ML ~~LOC~~ SOLN
40.0000 mg | Freq: Every day | SUBCUTANEOUS | Status: DC
  Filled 2015-07-16 (×2): qty 0.4

## 2015-07-16 MED ORDER — ENOXAPARIN SODIUM 40 MG/0.4ML ~~LOC~~ SOLN: 40.0000 mg | Freq: Every day | SUBCUTANEOUS | Status: DC

## 2015-07-16 NOTE — Progress Notes (Signed)
SICKLE CELL SERVICE PROGRESS NOTE  Brittany Foster ZOX:[id number] DOB: 08-11-1985 DOA: 07/12/2015 PCP: Jeanann Lewandowsky, MD  Assessment/Plan: Principal Problem:   Sickle cell pain crisis (HCC) Active Problems:   Anemia   Hypokalemia   Chest pain   Leukocytosis   Sickle cell anemia with crisis (HCC)  1. Hb SS with crisis: Pt still c/o pain 8/10. Will add clinician assisted doses on a PRN basis. Continue Toradol. Continue IVF.  2. Leukocytosis: 3. Anemia of chronic disease: Pt's Hb at baseline. 4. Chronic pain Syndrome:Medication non-compliance 5. Malfunctioning Port-o-cath: Pt seen by IR and to go for port revision today.   Code Status: Full Code Family Communication: N/A Disposition Plan: Not yet ready for discharge  Brittany Foster A.  Pager [phone]. If 7PM-7AM, please contact night-coverage.  07/16/2015, 11:49 AM  LOS: 4 days   Interim History: Pt reports that she has several days on which she does not require pain medication. However she had a recent extraction of 6 teeth which caused pain and likely triggered her pain crisis. Currently her pain is at an intensity of 8/10 and localized to her chest wall, back, legs and arms. She reports that she is able to manage with oral analgesics when her pain is at 7/10 and that her baseline is intermittent pain with no medication requirement on several days. She has used 37 mg with 83/74:demand/deliveries in thelast 24 hours.   Consultants:  None  Procedures:  None  Antibiotics:  None   Objective: Filed Vitals:   07/16/15 0437 07/16/15 0546 07/16/15 0800 07/16/15 0950  BP:  90/49    Pulse:  78    Temp:  97.9 F (36.6 C)    TempSrc:  Oral    Resp: 16 17 16 12   Height:      Weight:      SpO2: 92% 99% 95% 97%   Weight change:   Intake/Output Summary (Last 24 hours) at 07/16/15 1149 Last data filed at 07/15/15 2300  Gross per 24 hour  Intake   2450 ml  Output      0 ml  Net   2450 ml    General: Alert, awake,  oriented x3, in no acute distress.  HEENT: [place]/AT PEERL, EOMI, anicteric. Neck: Trachea midline,  no masses, no thyromegal,y no JVD, no carotid bruit OROPHARYNX:  Moist, No exudate/ erythema/lesions.  Heart: Regular rate and rhythm, without murmurs, rubs, gallops, PMI non-displaced, no heaves or thrills on palpation.  Lungs: Clear to auscultation, no wheezing or rhonchi noted. No increased vocal fremitus resonant to percussion  Abdomen: Soft, nontender, nondistended, positive bowel sounds, no masses no hepatosplenomegaly noted..  Neuro: No focal neurological deficits noted cranial nerves II through XII grossly intact.  Strength at functional baseline in bilateral upper and lower extremities. Musculoskeletal: No warm swelling or erythema around joints, no spinal tenderness noted. Psychiatric: Patient alert and oriented x3, good insight and cognition, good recent to remote recall.    Data Reviewed: Basic Metabolic Panel:  Recent Labs Lab 07/12/15 1729 07/12/15 2328 07/13/15 0634 07/14/15 0512  NA 140  --  140 138  K 3.4*  --  3.9 4.2  CL 107  --  109 111  CO2 24  --  22 21*  GLUCOSE 129*  --  137* 100*  BUN 6  --  <5* 9  CREATININE 0.41*  --  0.37* 0.46  CALCIUM 9.2  --  8.7* 8.7*  MG  --  2.0  --   --  Liver Function Tests:  Recent Labs Lab 07/12/15 1729 07/14/15 0512  AST 50* 45*  ALT 32 27  ALKPHOS 93 79  BILITOT 2.1* 1.9*  PROT 7.4 6.2*  ALBUMIN 4.3 3.4*   No results for input(s): LIPASE, AMYLASE in the last 168 hours. No results for input(s): AMMONIA in the last 168 hours. CBC:  Recent Labs Lab 07/12/15 1729 07/13/15 0634 07/14/15 0512 07/15/15 0422 07/16/15 0449  WBC 13.0* 11.4* 12.2* 12.8* 10.9*  NEUTROABS 8.3* 4.8 6.9 6.7 5.3  HGB 8.9* 7.8* 7.1* 7.3* 6.7*  HCT 25.8* 23.0* 20.6* 20.8* 19.3*  MCV 92.1 93.1 91.2 92.0 91.5  PLT 400 352 314 346 303   Cardiac Enzymes:  Recent Labs Lab 07/12/15 2328 07/13/15 0634  TROPONINI <0.03 <0.03   BNP  (last 3 results) No results for input(s): BNP in the last 8760 hours.  ProBNP (last 3 results) No results for input(s): PROBNP in the last 8760 hours.  CBG: No results for input(s): GLUCAP in the last 168 hours.  No results found for this or any previous visit (from the past 240 hour(s)).   Studies: Dg Chest 2 View  07/12/2015  CLINICAL DATA:  Sickle cell crisis.  Chest pain for 1 week. EXAM: CHEST - 2 VIEW COMPARISON:  Two-view chest x-ray 03/07/2015 and 03/03/2015 FINDINGS: The heart size is normal. No significant edema or effusion is present to suggest failure. No focal airspace disease is present. A right-sided Port-A-Cath is again seen. The catheter has changed position. The tip appears to be within the azygos vein. IMPRESSION: 1. Altered position of Port-A-Cath. The tip is suspected to be in the as azygous vein. If the catheter has been replaced since 03/07/15, I cannot exclude arterial positioning. CT or CTA of the chest would be useful for further evaluation. If the catheter has not been manipulated since January, follow-up with interventional radiology is recommended for catheter repositioning. These results were called by telephone at the time of interpretation on 07/12/2015 at 6:07 pm to Dr. Carlena SaxSchlesinger, who verbally acknowledged these results. Electronically Signed   By: Marin Robertshristopher  Mattern M.D.   On: 07/12/2015 18:08   Ct Angio Chest Pe W/cm &/or Wo Cm  07/12/2015  CLINICAL DATA:  Generalized weakness.  Sickle cell disease. EXAM: CT ANGIOGRAPHY CHEST WITH CONTRAST TECHNIQUE: Multidetector CT imaging of the chest was performed using the standard protocol during bolus administration of intravenous contrast. Multiplanar CT image reconstructions and MIPs were obtained to evaluate the vascular anatomy. CONTRAST:  100 mL Isovue 370 intravenous COMPARISON:  Radiographs 07/12/2015 FINDINGS: Cardiovascular: There is good opacification of the pulmonary arteries. There is no pulmonary embolism. The  thoracic aorta is normal in caliber and intact. The right subclavian central line enters the SVC and continues posteriorly in the azygos vein several cm back to the midline prevertebral portion of the vein. Lungs: There is stable scarring in the right lateral lung base, unchanged from 11/01/2014. The lungs are otherwise clear. Central airways: Normal Effusions: None Lymphadenopathy: None Esophagus: Unremarkable Upper abdomen: No significant abnormality Musculoskeletal: Scattered sclerosis and numerous skeletal structures, consistent with the described history of sickle cell disease. Review of the MIP images confirms the above findings. IMPRESSION: 1. No acute findings are evident in the chest. 2. The right subclavian central line enters the SVC and and continues posteriorly in the azygos vein. Consider interventional radiology consultation for catheter repositioning. 3. These results will be called to the ordering clinician or representative by the Radiologist Assistant, and communication documented in the  PACS or zVision Dashboard. Electronically Signed   By: Ellery Plunk M.D.   On: 07/12/2015 21:15    Scheduled Meds: .  ceFAZolin (ANCEF) IV  2 g Intravenous to XRAY  . DULoxetine  60 mg Oral Daily  . enoxaparin (LOVENOX) injection  40 mg Subcutaneous QHS  . folic acid  1 mg Oral Daily  . HYDROmorphone   Intravenous Q4H  . hydroxyurea  1,000 mg Oral Daily  . ketorolac  30 mg Intravenous Q6H  . potassium chloride SA  20 mEq Oral Daily  . senna-docusate  1 tablet Oral BID  . sodium chloride flush  10-40 mL Intracatheter Q12H  . topiramate  50 mg Oral BID   Continuous Infusions: . sodium chloride 75 mL/hr at 07/16/15 0954    Total time  Spent - 25 mintes

## 2015-07-16 NOTE — Progress Notes (Signed)
Unable to do today due to emergency procedures.  She can eat and NPO p MN.  Will plan for tomorrow.  Brittany Foster

## 2015-07-16 NOTE — Consult Note (Signed)
Chief Complaint: malpositioned PAC tip  Referring Physician:Dr. Della GooHarvette Jenkins  Supervising Physician: Oley BalmHassell, Daniel  Patient Status: In-pt  HPI: Brittany Foster is an 30 y.o. female who had a PAC placed in [place] with a recent imaging showing the tip is malpositioned.  She was scheduled to have evaluated as an outpatient on May 19th, but has since been admitted secondary to a sickle cell crisis.  Her WBC has normalized.  She is still having pain though, mostly in her chest and left flank.  We have been asked to see her while she is an inpatient to evaluate for revision/replacement of her PAC.   Past Medical History:  Past Medical History  Diagnosis Date  . Sickle cell anemia (HCC)   . Anemia     Past Surgical History:  Past Surgical History  Procedure Laterality Date  . Tubal ligation    . Cholecystectomy    . Cesarean section    . Port a cath placement Right     about 6-7 years ago  . Cholecystectomy  2000  . Multiple tooth extractions N/A     Family History:  Family History  Problem Relation Age of Onset  . Sickle cell anemia Other   . Sickle cell trait Father   . Sickle cell trait Mother     Social History:  reports that she has quit smoking. She does not have any smokeless tobacco history on file. She reports that she does not drink alcohol or use illicit drugs.  Allergies:  Allergies  Allergen Reactions  . Ultram [Tramadol] Other (See Comments)    seizures  . Zofran [Ondansetron Hcl] Nausea And Vomiting  . Buprenorphine Hcl Hives and Rash    Shaking Tolerates Percocet, Norco, and buprenorphine  . Morphine And Related Hives, Rash and Other (See Comments)    Shaking Tolerates Percocet, Norco, and buprenorphine  . Tape Rash    Medications:   Medication List    ASK your doctor about these medications        DULoxetine 60 MG capsule  Commonly known as:  CYMBALTA  Take 1 capsule (60 mg total) by mouth daily.     folic acid 1 MG tablet    Commonly known as:  FOLVITE  Take 1 tablet (1 mg total) by mouth daily.     gabapentin 300 MG capsule  Commonly known as:  NEURONTIN  Take 1 capsule (300 mg total) by mouth 3 (three) times daily.     hydroxyurea 500 MG capsule  Commonly known as:  HYDREA  TAKE 2 CAPSULES BY MOUTH DAILY. MAY TAKE WITH FOOD TO MINIMIZE GI SIDE EFFECTS.     ibuprofen 200 MG tablet  Commonly known as:  ADVIL,MOTRIN  Take 200 mg by mouth every 6 (six) hours as needed for moderate pain.     morphine 30 MG 12 hr tablet  Commonly known as:  MS CONTIN  Take 1 tablet (30 mg total) by mouth every 12 (twelve) hours.     oxyCODONE-acetaminophen 10-325 MG tablet  Commonly known as:  PERCOCET  Take 1 tablet by mouth every 6 (six) hours as needed for pain.     potassium chloride SA 20 MEQ tablet  Commonly known as:  K-DUR,KLOR-CON  Take 1 tablet (20 mEq total) by mouth daily.     potassium chloride SA 20 MEQ tablet  Commonly known as:  K-DUR,KLOR-CON  TAKE 1 TABLET BY MOUTH DAILY.     Topiramate ER 100 MG Cp24  Take  100 mg by mouth at bedtime.     Vitamin D3 5000 units Caps  Take 1 capsule (5,000 Units total) by mouth daily.        Please HPI for pertinent positives, otherwise complete 10 system ROS negative.  Mallampati Score: MD Evaluation Airway: WNL Heart: WNL Abdomen: WNL Chest/ Lungs: WNL ASA  Classification: 3 Mallampati/Airway Score: Two  Physical Exam: BP 90/49 mmHg  Pulse 78  Temp(Src) 97.9 F (36.6 C) (Oral)  Resp 16  Ht 5' (1.524 m)  Wt 126 lb 8 oz (57.38 kg)  BMI 24.71 kg/m2  SpO2 95%  LMP 07/07/2015 Body mass index is 24.71 kg/(m^2). General: pleasant, WD, WN black female who is laying in bed in NAD HEENT: head is normocephalic, atraumatic.  Sclera are noninjected.  PERRL.  Ears and nose without any masses or lesions.  Mouth is pink and moist Heart: regular, rate, and rhythm.  Normal s1,s2. No obvious murmurs, gallops, or rubs noted.  Palpable radial and pedal pulses  bilaterally Lungs: CTAB, no wheezes, rhonchi, or rales noted.  Respiratory effort nonlabored Abd: soft, NT, ND, +BS, no masses, hernias, or organomegaly MS: all 4 extremities are symmetrical with no cyanosis, clubbing, or edema. Psych: A&Ox3 with a flat affect.   Labs: Results for orders placed or performed during the hospital encounter of 07/12/15 (from the past 48 hour(s))  CBC with Differential/Platelet     Status: Abnormal   Collection Time: 07/15/15  4:22 AM  Result Value Ref Range   WBC 12.8 (H) 4.0 - 10.5 K/uL   RBC 2.26 (L) 3.87 - 5.11 MIL/uL   Hemoglobin 7.3 (L) 12.0 - 15.0 g/dL   HCT 96.0 (L) 45.4 - 09.8 %   MCV 92.0 78.0 - 100.0 fL   MCH 32.3 26.0 - 34.0 pg   MCHC 35.1 30.0 - 36.0 g/dL   RDW 11.9 (H) 14.7 - 82.9 %   Platelets 346 150 - 400 K/uL   Neutrophils Relative % 53 %   Lymphocytes Relative 31 %   Monocytes Relative 9 %   Eosinophils Relative 6 %   Basophils Relative 1 %   Neutro Abs 6.7 1.7 - 7.7 K/uL   Lymphs Abs 4.0 0.7 - 4.0 K/uL   Monocytes Absolute 1.2 (H) 0.1 - 1.0 K/uL   Eosinophils Absolute 0.8 (H) 0.0 - 0.7 K/uL   Basophils Absolute 0.1 0.0 - 0.1 K/uL   RBC Morphology POLYCHROMASIA PRESENT     Comment: TARGET CELLS SICKLE CELLS   Protime-INR     Status: None   Collection Time: 07/15/15  4:22 AM  Result Value Ref Range   Prothrombin Time 14.2 11.6 - 15.2 seconds   INR 1.08 0.00 - 1.49  CBC with Differential/Platelet     Status: Abnormal   Collection Time: 07/16/15  4:49 AM  Result Value Ref Range   WBC 10.9 (H) 4.0 - 10.5 K/uL   RBC 2.11 (L) 3.87 - 5.11 MIL/uL   Hemoglobin 6.7 (LL) 12.0 - 15.0 g/dL    Comment: REPEATED TO VERIFY CRITICAL RESULT CALLED TO, READ BACK BY AND VERIFIED WITH: L HUDSON RN 0517 07/16/15 A NAVARRO    HCT 19.3 (L) 36.0 - 46.0 %   MCV 91.5 78.0 - 100.0 fL   MCH 31.8 26.0 - 34.0 pg   MCHC 34.7 30.0 - 36.0 g/dL   RDW 56.2 (H) 13.0 - 86.5 %   Platelets 303 150 - 400 K/uL   Neutrophils Relative % 48 %   Lymphocytes  Relative 38 %   Monocytes Relative 9 %   Eosinophils Relative 5 %   Basophils Relative 0 %   nRBC 2 (H) 0 /100 WBC   Neutro Abs 5.3 1.7 - 7.7 K/uL   Lymphs Abs 4.1 (H) 0.7 - 4.0 K/uL   Monocytes Absolute 1.0 0.1 - 1.0 K/uL   Eosinophils Absolute 0.5 0.0 - 0.7 K/uL   Basophils Absolute 0.0 0.0 - 0.1 K/uL   RBC Morphology POLYCHROMASIA PRESENT     Comment: TARGET CELLS SICKLE CELLS     Imaging: No results found.  Assessment/Plan 1. Malposition of port a cath -NPO -labs and vitals have been reviewed. -Risks and Benefits discussed with the patient including, but not limited to bleeding, infection, pneumothorax, or fibrin sheath development and need for additional procedures. All of the patient's questions were answered, patient is agreeable to proceed. Consent signed and in chart.   Thank you for this interesting consult.  I greatly enjoyed meeting Brittany Foster and look forward to participating in their care.  A copy of this report was sent to the requesting provider on this date.  Electronically Signed: Letha Cape 07/16/2015, 9:33 AM   I spent a total of 30 minutes    in face to face in clinical consultation, greater than 50% of which was counseling/coordinating care for malposition of PAC, revise vs replace

## 2015-07-16 NOTE — Progress Notes (Signed)
Patient transferred to 1332, alert and oriented, in no distress. Report given to Ella-RN to follow-up with plan of care.

## 2015-07-16 NOTE — Progress Notes (Signed)
CRITICAL VALUE ALERT  Critical value received:  hgb 6.7  Date of notification:  07/16/15  Time of notification: 0517  Critical value read back:Yes.    Nurse who received alert:  Linward NatalLindsay Davontay Watlington, RN  MD notified (1st page): Craige CottaKirby  Time of first page:  [phone]  MD notified (2nd page):  Time of second page:  Responding MD:  N/A  Time MD responded: N/A  No new orders placed at this time.

## 2015-07-17 ENCOUNTER — Inpatient Hospital Stay (HOSPITAL_COMMUNITY): Payer: Medicaid Other

## 2015-07-17 MED ORDER — KETOROLAC TROMETHAMINE 30 MG/ML IJ SOLN
30.0000 mg | Freq: Four times a day (QID) | INTRAMUSCULAR | Status: DC
Start: 2015-07-17 — End: 2015-07-19
  Administered 2015-07-17 – 2015-07-19 (×9): 30 mg via INTRAVENOUS
  Filled 2015-07-17 (×9): qty 1

## 2015-07-17 MED ORDER — LIDOCAINE HCL 1 % IJ SOLN
INTRAMUSCULAR | Status: AC
  Filled 2015-07-17: qty 20

## 2015-07-17 MED ORDER — MIDAZOLAM HCL 2 MG/2ML IJ SOLN
INTRAMUSCULAR | Status: AC
  Filled 2015-07-17: qty 4

## 2015-07-17 MED ORDER — LIDOCAINE HCL 1 % IJ SOLN
INTRAMUSCULAR | Status: DC | PRN
  Administered 2015-07-17: 5 mL

## 2015-07-17 MED ORDER — MIDAZOLAM HCL 2 MG/2ML IJ SOLN
INTRAMUSCULAR | Status: AC | PRN
  Administered 2015-07-17 (×3): 0.5 mg via INTRAVENOUS
  Administered 2015-07-17: 1 mg via INTRAVENOUS
  Administered 2015-07-17 (×3): 0.5 mg via INTRAVENOUS

## 2015-07-17 MED ORDER — FENTANYL CITRATE (PF) 100 MCG/2ML IJ SOLN
INTRAMUSCULAR | Status: AC | PRN
  Administered 2015-07-17 (×2): 25 ug via INTRAVENOUS
  Administered 2015-07-17 (×2): 50 ug via INTRAVENOUS

## 2015-07-17 MED ORDER — FENTANYL CITRATE (PF) 100 MCG/2ML IJ SOLN
INTRAMUSCULAR | Status: AC
  Filled 2015-07-17: qty 2

## 2015-07-17 NOTE — Sedation Documentation (Signed)
Patient denies pain and is resting comfortably.  

## 2015-07-17 NOTE — Procedures (Signed)
Interventional Radiology Procedure Note  Procedure: Revision of right chest port  Findings:  After making incision over port pocket, port explanted.  Catheter detached and guidewire advanced through catheter lumen. Able to straighten catheter course into SVC.  Port catheter reattached. Port placed back in pocket and wound closed. Port accessed.  Aspirates blood freely and flushes normally.  Access needle left in place.   Complications: No immediate    Brittany Foster T. Fredia SorrowYamagata, M.D Pager:  [phone]

## 2015-07-17 NOTE — Progress Notes (Signed)
SICKLE CELL SERVICE PROGRESS NOTE  Brittany Foster ZOX:[id number] DOB: 11/26/1985 DOA: 07/12/2015 PCP: Jeanann LewandowskyJEGEDE, OLUGBEMIGA, MD  Assessment/Plan: Principal Problem:   Sickle cell pain crisis (HCC) Active Problems:   Anemia   Hypokalemia   Chest pain   Leukocytosis   Sickle cell anemia with crisis (HCC)  1. Hb SS with crisis: Pt still c/o pain 6-8/10. Will continue PCA at current dose and  clinician assisted doses on a PRN basis. Continue Toradol. Continue IVF.  2. Leukocytosis: No evidence of infection.  3. Anemia of chronic disease: Pt's Hb at baseline. 4. Chronic pain Syndrome:Medication non-compliance 5. Malfunctioning Port-o-cath: Pt seen by IR and to go for port revision today.   Code Status: Full Code Family Communication: N/A Disposition Plan: Not yet ready for discharge  Brittany Foster A.  Pager [phone]. If 7PM-7AM, please contact night-coverage.  07/17/2015, 2:52 PM  LOS: 5 days   Interim History: Pt reports that her pain is fluctuating between 6-8/10 and localized to her chest and hips. She is scheduled to have port-o-cath revised today. Had a BM last night.   Consultants:  Interventional Radiology  Procedures:  None  Antibiotics:  None   Objective:  Weight change:   Intake/Output Summary (Last 24 hours) at 07/17/15 1452 Last data filed at 07/16/15 2137  Gross per 24 hour  Intake    120 ml  Output      0 ml  Net    120 ml    General: Alert, awake, oriented x3, in no acute distress.  Vital Signs: Reviewed and stable. HEENT: [place]/AT PEERL, EOMI, anicteric. Neck: Trachea midline,  no masses, no thyromegal,y no JVD, no carotid bruit OROPHARYNX:  Moist, No exudate/ erythema/lesions.  Heart: Regular rate and rhythm, without murmurs, rubs, gallops.  Lungs: Clear to auscultation, no wheezing or rhonchi noted.  Abdomen: Soft, nontender, nondistended, positive bowel sounds, no masses no hepatosplenomegaly noted..  Neuro: No focal neurological deficits  noted cranial nerves II through XII grossly intact.  Strength at functional baseline in bilateral upper and lower extremities. Musculoskeletal: No warm swelling or erythema around joints, no spinal tenderness noted.     Data Reviewed: Basic Metabolic Panel:  Recent Labs Lab 07/12/15 1729 07/12/15 2328 07/13/15 0634 07/14/15 0512  NA 140  --  140 138  K 3.4*  --  3.9 4.2  CL 107  --  109 111  CO2 24  --  22 21*  GLUCOSE 129*  --  137* 100*  BUN 6  --  <5* 9  CREATININE 0.41*  --  0.37* 0.46  CALCIUM 9.2  --  8.7* 8.7*  MG  --  2.0  --   --    Liver Function Tests:  Recent Labs Lab 07/12/15 1729 07/14/15 0512  AST 50* 45*  ALT 32 27  ALKPHOS 93 79  BILITOT 2.1* 1.9*  PROT 7.4 6.2*  ALBUMIN 4.3 3.4*   No results for input(s): LIPASE, AMYLASE in the last 168 hours. No results for input(s): AMMONIA in the last 168 hours. CBC:  Recent Labs Lab 07/12/15 1729 07/13/15 0634 07/14/15 0512 07/15/15 0422 07/16/15 0449  WBC 13.0* 11.4* 12.2* 12.8* 10.9*  NEUTROABS 8.3* 4.8 6.9 6.7 5.3  HGB 8.9* 7.8* 7.1* 7.3* 6.7*  HCT 25.8* 23.0* 20.6* 20.8* 19.3*  MCV 92.1 93.1 91.2 92.0 91.5  PLT 400 352 314 346 303   Cardiac Enzymes:  Recent Labs Lab 07/12/15 2328 07/13/15 0634  TROPONINI <0.03 <0.03   BNP (last 3 results) No results  for input(s): BNP in the last 8760 hours.  ProBNP (last 3 results) No results for input(s): PROBNP in the last 8760 hours.  CBG: No results for input(s): GLUCAP in the last 168 hours.  No results found for this or any previous visit (from the past 240 hour(s)).   Studies: Dg Chest 2 View  07/12/2015  CLINICAL DATA:  Sickle cell crisis.  Chest pain for 1 week. EXAM: CHEST - 2 VIEW COMPARISON:  Two-view chest x-ray 03/07/2015 and 03/03/2015 FINDINGS: The heart size is normal. No significant edema or effusion is present to suggest failure. No focal airspace disease is present. A right-sided Port-A-Cath is again seen. The catheter has  changed position. The tip appears to be within the azygos vein. IMPRESSION: 1. Altered position of Port-A-Cath. The tip is suspected to be in the as azygous vein. If the catheter has been replaced since 03/07/15, I cannot exclude arterial positioning. CT or CTA of the chest would be useful for further evaluation. If the catheter has not been manipulated since January, follow-up with interventional radiology is recommended for catheter repositioning. These results were called by telephone at the time of interpretation on 07/12/2015 at 6:07 pm to Dr. Carlena Sax, who verbally acknowledged these results. Electronically Signed   By: Marin Roberts M.D.   On: 07/12/2015 18:08   Ct Angio Chest Pe W/cm &/or Wo Cm  07/12/2015  CLINICAL DATA:  Generalized weakness.  Sickle cell disease. EXAM: CT ANGIOGRAPHY CHEST WITH CONTRAST TECHNIQUE: Multidetector CT imaging of the chest was performed using the standard protocol during bolus administration of intravenous contrast. Multiplanar CT image reconstructions and MIPs were obtained to evaluate the vascular anatomy. CONTRAST:  100 mL Isovue 370 intravenous COMPARISON:  Radiographs 07/12/2015 FINDINGS: Cardiovascular: There is good opacification of the pulmonary arteries. There is no pulmonary embolism. The thoracic aorta is normal in caliber and intact. The right subclavian central line enters the SVC and continues posteriorly in the azygos vein several cm back to the midline prevertebral portion of the vein. Lungs: There is stable scarring in the right lateral lung base, unchanged from 11/01/2014. The lungs are otherwise clear. Central airways: Normal Effusions: None Lymphadenopathy: None Esophagus: Unremarkable Upper abdomen: No significant abnormality Musculoskeletal: Scattered sclerosis and numerous skeletal structures, consistent with the described history of sickle cell disease. Review of the MIP images confirms the above findings. IMPRESSION: 1. No acute findings are  evident in the chest. 2. The right subclavian central line enters the SVC and and continues posteriorly in the azygos vein. Consider interventional radiology consultation for catheter repositioning. 3. These results will be called to the ordering clinician or representative by the Radiologist Assistant, and communication documented in the PACS or zVision Dashboard. Electronically Signed   By: Ellery Plunk M.D.   On: 07/12/2015 21:15    Scheduled Meds: . DULoxetine  60 mg Oral Daily  . enoxaparin (LOVENOX) injection  40 mg Subcutaneous QHS  . folic acid  1 mg Oral Daily  . HYDROmorphone   Intravenous Q4H  . hydroxyurea  1,000 mg Oral Daily  . ketorolac  30 mg Intravenous Q6H  . potassium chloride SA  20 mEq Oral Daily  . senna-docusate  1 tablet Oral BID  . sodium chloride flush  10-40 mL Intracatheter Q12H  . topiramate  50 mg Oral BID   Continuous Infusions: . sodium chloride 75 mL/hr at 07/16/15 0954    Total time  Spent - 25 mintes

## 2015-07-18 LAB — BASIC METABOLIC PANEL
Anion gap: 4 — ABNORMAL LOW (ref 5–15)
BUN: 6 mg/dL (ref 6–20)
CHLORIDE: 110 mmol/L (ref 101–111)
CO2: 24 mmol/L (ref 22–32)
Calcium: 8.3 mg/dL — ABNORMAL LOW (ref 8.9–10.3)
Creatinine, Ser: 0.44 mg/dL (ref 0.44–1.00)
GFR calc Af Amer: >60 mL/min (ref >60)
GFR calc non Af Amer: >60 mL/min (ref >60)
GLUCOSE: 134 mg/dL — AB (ref 65–99)
POTASSIUM: 3.6 mmol/L (ref 3.5–5.1)
SODIUM: 138 mmol/L (ref 135–145)

## 2015-07-18 LAB — CBC WITH DIFFERENTIAL/PLATELET
BASOS PCT: 0 %
Basophils Absolute: 0 10*3/uL (ref 0.0–0.1)
EOS ABS: 0.3 10*3/uL (ref 0.0–0.7)
Eosinophils Relative: 3 %
HCT: 18.4 % — ABNORMAL LOW (ref 36.0–46.0)
HEMOGLOBIN: 6.5 g/dL — AB (ref 12.0–15.0)
Lymphocytes Relative: 39 %
Lymphs Abs: 3.6 10*3/uL (ref 0.7–4.0)
MCH: 33 pg (ref 26.0–34.0)
MCHC: 35.3 g/dL (ref 30.0–36.0)
MCV: 93.4 fL (ref 78.0–100.0)
MONO ABS: 0.6 10*3/uL (ref 0.1–1.0)
Monocytes Relative: 7 %
NEUTROS ABS: 4.6 10*3/uL (ref 1.7–7.7)
NRBC: 3 /100{WBCs} — AB
Neutrophils Relative %: 51 %
PLATELETS: 356 10*3/uL (ref 150–400)
RBC: 1.97 MIL/uL — ABNORMAL LOW (ref 3.87–5.11)
RDW: 29.6 % — ABNORMAL HIGH (ref 11.5–15.5)
WBC: 9.1 10*3/uL (ref 4.0–10.5)

## 2015-07-18 LAB — RETICULOCYTES
RBC.: 1.97 MIL/uL — AB (ref 3.87–5.11)
RETIC COUNT ABSOLUTE: 368.4 10*3/uL — AB (ref 19.0–186.0)
RETIC CT PCT: 18.7 % — AB (ref 0.4–3.1)

## 2015-07-18 MED ORDER — MORPHINE SULFATE ER 30 MG PO TBCR
30.0000 mg | EXTENDED_RELEASE_TABLET | Freq: Two times a day (BID) | ORAL | Status: DC
Start: 2015-07-18 — End: 2015-07-19
  Administered 2015-07-18 – 2015-07-19 (×3): 30 mg via ORAL
  Filled 2015-07-18 (×3): qty 1

## 2015-07-18 NOTE — Progress Notes (Signed)
SICKLE CELL SERVICE PROGRESS NOTE  Brittany Foster ZOX:[id number] DOB: 1985/03/28 DOA: 07/12/2015 PCP: Jeanann Lewandowsky, MD  Assessment/Plan: Principal Problem:   Sickle cell pain crisis (HCC) Active Problems:   Anemia   Hypokalemia   Chest pain   Leukocytosis   Sickle cell anemia with crisis (HCC)  1. Hb SS with crisis: Pt still c/o pain 6-8/10.I have discussed with patient that since current regimen is ineffective I will transition to oral analgesics including MS Contin and discontinue PCA after 6 hours of starting MS Contin and Percocet.  2. Leukocytosis: Resolved.  3. Anemia of chronic disease: Pt's Hb at baseline. 4. Chronic pain Syndrome: Continue MS Contin.  5. Malfunctioning Port-o-cath: Port-o-cath revised yesterday and functioning well.   Code Status: Full Code Family Communication: N/A Disposition Plan: Anticipate discharge in 24-48 hours.  Brittany Foster A.  Pager [phone]. If 7PM-7AM, please contact night-coverage.  07/18/2015, 11:37 AM  LOS: 6 days   Interim History: Pt reports that her current pain regimen is not efective. Thus I have discussed discontinuing PCA and transitioning to oral analgesics. Pt initially reported that she was not taking MS Contin. However after confronted with the information on N[facility] of last time MS Contin filled and after speaking with Dr. Hyman Hopes who reports that he did not stop patient's MS Contin, patient atmitted to still taking MS Contin. I have restarted MS Contin and will discontinue PCA in 6 hours.  Had a BM last night.   Consultants:  Interventional Radiology  Procedures:  None  Antibiotics:  None   Objective:  Weight change:   Intake/Output Summary (Last 24 hours) at 07/18/15 1137 Last data filed at 07/18/15 0603  Gross per 24 hour  Intake  729.5 ml  Output      0 ml  Net  729.5 ml    General: Alert, awake, oriented x3, in no acute distress.  Vital Signs: Reviewed and stable. HEENT: [place]/AT PEERL, EOMI,  anicteric. Neck: Trachea midline,  no masses, no thyromegal,y no JVD, no carotid bruit OROPHARYNX:  Moist, No exudate/ erythema/lesions.  Heart: Regular rate and rhythm, without murmurs, rubs, gallops.  Lungs: Clear to auscultation, no wheezing or rhonchi noted.  Abdomen: Soft, nontender, nondistended, positive bowel sounds, no masses no hepatosplenomegaly noted.  Neuro: No focal neurological deficits noted cranial nerves II through XII grossly intact.  Strength at functional baseline in bilateral upper and lower extremities. Musculoskeletal: No warmth swelling or erythema around joints, no spinal tenderness noted.     Data Reviewed: Basic Metabolic Panel:  Recent Labs Lab 07/12/15 1729 07/12/15 2328 07/13/15 0634 07/14/15 0512 07/18/15 0600  NA 140  --  140 138 138  K 3.4*  --  3.9 4.2 3.6  CL 107  --  109 111 110  CO2 24  --  22 21* 24  GLUCOSE 129*  --  137* 100* 134*  BUN 6  --  <5* 9 6  CREATININE 0.41*  --  0.37* 0.46 0.44  CALCIUM 9.2  --  8.7* 8.7* 8.3*  MG  --  2.0  --   --   --    Liver Function Tests:  Recent Labs Lab 07/12/15 1729 07/14/15 0512  AST 50* 45*  ALT 32 27  ALKPHOS 93 79  BILITOT 2.1* 1.9*  PROT 7.4 6.2*  ALBUMIN 4.3 3.4*   No results for input(s): LIPASE, AMYLASE in the last 168 hours. No results for input(s): AMMONIA in the last 168 hours. CBC:  Recent Labs Lab 07/13/15 0634 07/14/15  9604 07/15/15 0422 07/16/15 0449 07/18/15 0600  WBC 11.4* 12.2* 12.8* 10.9* 9.1  NEUTROABS 4.8 6.9 6.7 5.3 4.6  HGB 7.8* 7.1* 7.3* 6.7* 6.5*  HCT 23.0* 20.6* 20.8* 19.3* 18.4*  MCV 93.1 91.2 92.0 91.5 93.4  PLT 352 314 346 303 356   Cardiac Enzymes:  Recent Labs Lab 07/12/15 2328 07/13/15 0634  TROPONINI <0.03 <0.03   BNP (last 3 results) No results for input(s): BNP in the last 8760 hours.  ProBNP (last 3 results) No results for input(s): PROBNP in the last 8760 hours.  CBG: No results for input(s): GLUCAP in the last 168  hours.  No results found for this or any previous visit (from the past 240 hour(s)).   Studies: Dg Chest 2 View  07/12/2015  CLINICAL DATA:  Sickle cell crisis.  Chest pain for 1 week. EXAM: CHEST - 2 VIEW COMPARISON:  Two-view chest x-ray 03/07/2015 and 03/03/2015 FINDINGS: The heart size is normal. No significant edema or effusion is present to suggest failure. No focal airspace disease is present. A right-sided Port-A-Cath is again seen. The catheter has changed position. The tip appears to be within the azygos vein. IMPRESSION: 1. Altered position of Port-A-Cath. The tip is suspected to be in the as azygous vein. If the catheter has been replaced since 03/07/15, I cannot exclude arterial positioning. CT or CTA of the chest would be useful for further evaluation. If the catheter has not been manipulated since January, follow-up with interventional radiology is recommended for catheter repositioning. These results were called by telephone at the time of interpretation on 07/12/2015 at 6:07 pm to Dr. Carlena Sax, who verbally acknowledged these results. Electronically Signed   By: Marin Roberts M.D.   On: 07/12/2015 18:08   Ct Angio Chest Pe W/cm &/or Wo Cm  07/12/2015  CLINICAL DATA:  Generalized weakness.  Sickle cell disease. EXAM: CT ANGIOGRAPHY CHEST WITH CONTRAST TECHNIQUE: Multidetector CT imaging of the chest was performed using the standard protocol during bolus administration of intravenous contrast. Multiplanar CT image reconstructions and MIPs were obtained to evaluate the vascular anatomy. CONTRAST:  100 mL Isovue 370 intravenous COMPARISON:  Radiographs 07/12/2015 FINDINGS: Cardiovascular: There is good opacification of the pulmonary arteries. There is no pulmonary embolism. The thoracic aorta is normal in caliber and intact. The right subclavian central line enters the SVC and continues posteriorly in the azygos vein several cm back to the midline prevertebral portion of the vein. Lungs:  There is stable scarring in the right lateral lung base, unchanged from 11/01/2014. The lungs are otherwise clear. Central airways: Normal Effusions: None Lymphadenopathy: None Esophagus: Unremarkable Upper abdomen: No significant abnormality Musculoskeletal: Scattered sclerosis and numerous skeletal structures, consistent with the described history of sickle cell disease. Review of the MIP images confirms the above findings. IMPRESSION: 1. No acute findings are evident in the chest. 2. The right subclavian central line enters the SVC and and continues posteriorly in the azygos vein. Consider interventional radiology consultation for catheter repositioning. 3. These results will be called to the ordering clinician or representative by the Radiologist Assistant, and communication documented in the PACS or zVision Dashboard. Electronically Signed   By: Ellery Plunk M.D.   On: 07/12/2015 21:15   Ir Fluoro Guide Cv Line Right  07/17/2015  CLINICAL DATA:  Sickle cell anemia with malfunctioning port placed at an outside institution in February. The port catheter is coiled and extends into the azygos vein by CT. EXAM: REVISION OF TUNNELED CENTRAL VENOUS PORT-A-CATH WITH  FLUOROSCOPY ANESTHESIA/SEDATION: 4.0 mg IV Versed; 150 mcg IV Fentanyl. Total Moderate Sedation Time 51 minutes. The patient's level of consciousness and physiologic status were continuously monitored during the procedure by Radiology nursing. MEDICATIONS: 2 g IV Ancef. As antibiotic prophylaxis, Ancef was ordered pre-procedure and administered intravenously within one hour of incision. FLUOROSCOPY TIME:  1 minute. PROCEDURE: The procedure, risks, benefits, and alternatives were explained to the patient. Questions regarding the procedure were encouraged and answered. The patient understands and consents to the procedure. A time-out was performed prior to initiating the procedure. The right neck and chest were prepped with chlorhexidine in a sterile  fashion, and a sterile drape was applied covering the operative field. Maximum barrier sterile technique with sterile gowns and gloves were used for the procedure. Local anesthesia was provided with 1% lidocaine. A skin incision was made overlying the right chest Port-A-Cath at the level of a previous healed incision. Utilizing sharp and blunt dissection, the port was freed from surrounding scar tissue. Retention sutures were cut and removed. The port catheter was then detached from the port reservoir. A guidewire was advanced through the catheter under fluoroscopy to straighten the catheter. The guidewire was advanced through the right atrium and into the inferior vena cava. The port catheter was then reattached to the port reservoir and the port placed back into the pocket. The catheter was accessed, aspirated and flushed with saline. Final fluoroscopic image confirms catheter positioning after revision. The port pocket incision was closed with subcutaneous 3-0 Monocryl and subcuticular 4-0 Vicryl. Dermabond was applied to the incision. COMPLICATIONS: None.  No pneumothorax. FINDINGS: Initial fluoroscopy confirms malpositioning of the Port-A-Cath with the catheter tubing demonstrating coiling in the subcutaneous tissues and subclavian vein and the tip extending into the azygos vein. After catheter detachment and guidewire advancement, the catheter was able to be straightened out and properly positioned in the SVC. After revision, catheter course extends appropriately to the level of the cavoatrial junction. Aspiration yields free return of blood and the port flushes normally. IMPRESSION: Revision of malpositioned Port-A-Cath. The catheter was able to be re- directed into the SVC from malpositioning in the azygos vein with guidewire advancement. The port was reconnected and placed back into the original pocket. The port was accessed and can be used immediately. Electronically Signed   By: Irish LackGlenn  Yamagata M.D.   On:  07/17/2015 17:20    Scheduled Meds: . DULoxetine  60 mg Oral Daily  . enoxaparin (LOVENOX) injection  40 mg Subcutaneous QHS  . folic acid  1 mg Oral Daily  . HYDROmorphone   Intravenous Q4H  . hydroxyurea  1,000 mg Oral Daily  . ketorolac  30 mg Intravenous Q6H  . morphine  30 mg Oral Q12H  . potassium chloride SA  20 mEq Oral Daily  . senna-docusate  1 tablet Oral BID  . sodium chloride flush  10-40 mL Intracatheter Q12H  . topiramate  50 mg Oral BID   Continuous Infusions: . sodium chloride 75 mL/hr at 07/16/15 0954    Total time  Spent - 25 minutes. >50% involved face-to-face contact with the patient for assessment, counseling and coordination of care.

## 2015-07-18 NOTE — Progress Notes (Signed)
Patient refused Lovenox and Senna at HS.

## 2015-07-18 NOTE — Care Management Note (Signed)
Case Management Note  Patient Details  Name: Brittany CampbellMiranda Foster MRN: [id number] Date of Birth: 09/02/1985  Subjective/Objective:       30 [facility]            Action/Plan: From home with children.    Expected Discharge Date:                  Expected Discharge Plan:  Home/Self Care  In-House Referral:     Discharge planning Services  CM Consult  Post Acute Care Choice:    Choice offered to:     DME Arranged:    DME Agency:     HH Arranged:    HH Agency:     Status of Service:  In process, will continue to follow  Medicare Important Message Given:    Date Medicare IM Given:    Medicare IM give by:    Date Additional Medicare IM Given:    Additional Medicare Important Message give by:     If discussed at Long Length of Stay Meetings, dates discussed:    Additional Comments: Chart reviewed and no CM needs identified or communicated.  CM will continue to follow. Bartholome BillCLEMENTS, [place] Pam H, RN 07/18/2015, 11:55 AM  [phone]

## 2015-07-19 ENCOUNTER — Ambulatory Visit (HOSPITAL_COMMUNITY): Payer: Medicaid Other

## 2015-07-19 ENCOUNTER — Other Ambulatory Visit (HOSPITAL_COMMUNITY): Payer: Medicaid Other

## 2015-07-19 DIAGNOSIS — T82598A Other mechanical complication of other cardiac and vascular devices and implants, initial encounter: Secondary | ICD-10-CM

## 2015-07-19 MED ORDER — HEPARIN SOD (PORK) LOCK FLUSH 100 UNIT/ML IV SOLN
500.0000 [IU] | Freq: Once | INTRAVENOUS | Status: DC
  Filled 2015-07-19: qty 5

## 2015-07-19 NOTE — Progress Notes (Signed)
Pharmacy IV to PO conversion  The patient is receiving diphenhydramine by the intravenous route.  Based on the following criteria approved by the Pharmacy and Therapeutics Committee and the Medical Executive Committee, diphenhydramine is being converted to the equivalent oral dose form.   Not prescribed to treat or prevent a severe allergic reaction   Not prescribed as premedication prior to receiving blood product, biologic medication, antimicrobial, or chemotherapy agent   The patient has tolerated at least one dose of an oral or enteral medication   The patient has no evidence of active gastrointestinal bleeding or impaired GI absorption (gastrectomy, short bowel, patient on TNA or NPO).   The patient is not undergoing procedural sedation  If you have any questions about this conversion, please contact the Pharmacy Department (ext [phone]).  Thank you.  Bernadene Personrew Agastya Meister, PharmD Pager: [phone] 07/19/2015, 9:02 AM

## 2015-07-19 NOTE — Discharge Summary (Signed)
Brittany Foster MRN: [id number] DOB/AGE: 31/03/1985 31 y.o.  Admit date: 07/12/2015 Discharge date: 07/19/2015  Primary Care Physician:  Jeanann LewandowskyJEGEDE, OLUGBEMIGA, MD   Discharge Diagnoses:   Patient Active Problem List   Diagnosis Date Noted  . Sickle cell anemia with crisis (HCC) 07/12/2015  . Sickle cell anemia with pain (HCC) 05/28/2015  . Intractable pain   . Chest pain 03/07/2015  . Leukocytosis 03/07/2015  . Vitamin D deficiency 01/08/2015  . Chronic pain syndrome 01/08/2015  . Sickle cell crisis (HCC) 10/22/2014  . Hypokalemia 08/21/2014  . Depression 08/09/2014  . 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 Sickle cell disease with crisis (HCC) 07/12/2014  . Hb-SS disease with crisis (HCC) 06/11/2014  . Pericardial effusion   . Anemia 02/09/2014  . Neuropathy (HCC) 02/09/2014  . Chronic headache disorder 02/09/2014  . Abnormal CT scan, chest   . Sickle cell pain crisis (HCC) 12/30/2013    DISCHARGE MEDICATION:   Medication List    TAKE these medications        DULoxetine 60 MG capsule  Commonly known as:  CYMBALTA  Take 1 capsule (60 mg total) by mouth daily.     folic acid 1 MG tablet  Commonly known as:  FOLVITE  Take 1 tablet (1 mg total) by mouth daily.     gabapentin 300 MG capsule  Commonly known as:  NEURONTIN  Take 1 capsule (300 mg total) by mouth 3 (three) times daily.     hydroxyurea 500 MG capsule  Commonly known as:  HYDREA  TAKE 2 CAPSULES BY MOUTH DAILY. MAY TAKE WITH FOOD TO MINIMIZE GI SIDE EFFECTS.     ibuprofen 200 MG tablet  Commonly known as:  ADVIL,MOTRIN  Take 200 mg by mouth every 6 (six) hours as needed for moderate pain.     morphine 30 MG 12 hr tablet  Commonly known as:  MS CONTIN  Take 1 tablet (30 mg total) by mouth every 12 (twelve) hours.     oxyCODONE-acetaminophen 10-325 MG tablet  Commonly known as:  PERCOCET  Take 1 tablet by mouth every 6 (six) hours as needed for pain.     potassium chloride SA 20 MEQ tablet   Commonly known as:  K-DUR,KLOR-CON  Take 1 tablet (20 mEq total) by mouth daily.     potassium chloride SA 20 MEQ tablet  Commonly known as:  K-DUR,KLOR-CON  TAKE 1 TABLET BY MOUTH DAILY.     Topiramate ER 100 MG Cp24  Take 100 mg by mouth at bedtime.     Vitamin D3 5000 units Caps  Take 1 capsule (5,000 Units total) by mouth daily.          Consults:     SIGNIFICANT DIAGNOSTIC STUDIES:  Dg Chest 2 View  07/12/2015  CLINICAL DATA:  Sickle cell crisis.  Chest pain for 1 week. EXAM: CHEST - 2 VIEW COMPARISON:  Two-view chest x-ray 03/07/2015 and 03/03/2015 FINDINGS: The heart size is normal. No significant edema or effusion is present to suggest failure. No focal airspace disease is present. A right-sided Port-A-Cath is again seen. The catheter has changed position. The tip appears to be within the azygos vein. IMPRESSION: 1. Altered position of Port-A-Cath. The tip is suspected to be in the as azygous vein. If the catheter has been replaced since 03/07/15, I cannot exclude arterial positioning. CT or CTA of the chest would be useful for further evaluation. If the catheter has not been manipulated since January, follow-up with interventional  radiology is recommended for catheter repositioning. These results were called by telephone at the time of interpretation on 07/12/2015 at 6:07 pm to Dr. Carlena Sax, who verbally acknowledged these results. Electronically Signed   By: Marin Roberts M.D.   On: 07/12/2015 18:08   Ct Angio Chest Pe W/cm &/or Wo Cm  07/12/2015  CLINICAL DATA:  Generalized weakness.  Sickle cell disease. EXAM: CT ANGIOGRAPHY CHEST WITH CONTRAST TECHNIQUE: Multidetector CT imaging of the chest was performed using the standard protocol during bolus administration of intravenous contrast. Multiplanar CT image reconstructions and MIPs were obtained to evaluate the vascular anatomy. CONTRAST:  100 mL Isovue 370 intravenous COMPARISON:  Radiographs 07/12/2015 FINDINGS:  Cardiovascular: There is good opacification of the pulmonary arteries. There is no pulmonary embolism. The thoracic aorta is normal in caliber and intact. The right subclavian central line enters the SVC and continues posteriorly in the azygos vein several cm back to the midline prevertebral portion of the vein. Lungs: There is stable scarring in the right lateral lung base, unchanged from 11/01/2014. The lungs are otherwise clear. Central airways: Normal Effusions: None Lymphadenopathy: None Esophagus: Unremarkable Upper abdomen: No significant abnormality Musculoskeletal: Scattered sclerosis and numerous skeletal structures, consistent with the described history of sickle cell disease. Review of the MIP images confirms the above findings. IMPRESSION: 1. No acute findings are evident in the chest. 2. The right subclavian central line enters the SVC and and continues posteriorly in the azygos vein. Consider interventional radiology consultation for catheter repositioning. 3. These results will be called to the ordering clinician or representative by the Radiologist Assistant, and communication documented in the PACS or zVision Dashboard. Electronically Signed   By: Ellery Plunk M.D.   On: 07/12/2015 21:15   Ir Fluoro Guide Cv Line Right  07/17/2015  CLINICAL DATA:  Sickle cell anemia with malfunctioning port placed at an outside institution in February. The port catheter is coiled and extends into the azygos vein by CT. EXAM: REVISION OF TUNNELED CENTRAL VENOUS PORT-A-CATH WITH FLUOROSCOPY ANESTHESIA/SEDATION: 4.0 mg IV Versed; 150 mcg IV Fentanyl. Total Moderate Sedation Time 51 minutes. The patient's level of consciousness and physiologic status were continuously monitored during the procedure by Radiology nursing. MEDICATIONS: 2 g IV Ancef. As antibiotic prophylaxis, Ancef was ordered pre-procedure and administered intravenously within one hour of incision. FLUOROSCOPY TIME:  1 minute. PROCEDURE: The  procedure, risks, benefits, and alternatives were explained to the patient. Questions regarding the procedure were encouraged and answered. The patient understands and consents to the procedure. A time-out was performed prior to initiating the procedure. The right neck and chest were prepped with chlorhexidine in a sterile fashion, and a sterile drape was applied covering the operative field. Maximum barrier sterile technique with sterile gowns and gloves were used for the procedure. Local anesthesia was provided with 1% lidocaine. A skin incision was made overlying the right chest Port-A-Cath at the level of a previous healed incision. Utilizing sharp and blunt dissection, the port was freed from surrounding scar tissue. Retention sutures were cut and removed. The port catheter was then detached from the port reservoir. A guidewire was advanced through the catheter under fluoroscopy to straighten the catheter. The guidewire was advanced through the right atrium and into the inferior vena cava. The port catheter was then reattached to the port reservoir and the port placed back into the pocket. The catheter was accessed, aspirated and flushed with saline. Final fluoroscopic image confirms catheter positioning after revision. The port pocket incision was closed  with subcutaneous 3-0 Monocryl and subcuticular 4-0 Vicryl. Dermabond was applied to the incision. COMPLICATIONS: None.  No pneumothorax. FINDINGS: Initial fluoroscopy confirms malpositioning of the Port-A-Cath with the catheter tubing demonstrating coiling in the subcutaneous tissues and subclavian vein and the tip extending into the azygos vein. After catheter detachment and guidewire advancement, the catheter was able to be straightened out and properly positioned in the SVC. After revision, catheter course extends appropriately to the level of the cavoatrial junction. Aspiration yields free return of blood and the port flushes normally. IMPRESSION:  Revision of malpositioned Port-A-Cath. The catheter was able to be re- directed into the SVC from malpositioning in the azygos vein with guidewire advancement. The port was reconnected and placed back into the original pocket. The port was accessed and can be used immediately. Electronically Signed   By: Irish Lack M.D.   On: 07/17/2015 17:20       OTHER PROCEDURES: Port-A-Cath revision by interventional radiology on 07/17/2015  No results found for this or any previous visit (from the past 240 hour(s)).  BRIEF ADMITTING H & P: Brittany Foster is a 30 y.o. female with a history of Sickle Cell disease who presents to the ED with complaints of 9/10 Pain all over x 4 days and Chest pain and SOB for the past 2 days. [facility] and sent to the ED. A Chest X-ray and CTA of the Chest were performed, and were negative for acute findings, however her Port A-Cath was incidentally found to be displaced . She was referred for medial admission.    Hospital Course:  Present on Admission:  . Sickle cell pain crisis (HCC) . Leukocytosis . Anemia . Hypokalemia . Chest pain . Sickle cell anemia with crisis [facility])  This is an opiate tolerant patient with hemoglobin SS who was admitted with sickle cell crisis. She was initially treated with IV Dilaudid via PCA, Toradol and IV fluids. As her pain improved she was transitioned to oral analgesics is discharged home on her prehospital regimen. An incidental finding on this admission is that she had some dysfunction of her Port-A-Cath. She was evaluated by interventional radiology and Port-A-Cath was revised without any need for removal or surgery. After the revision of the Port-A-Cath was fully functional without any difficulty. At the time of discharge the patient had no need for oxygen and was ambulating without any difficulty. She's to follow-up with her primary care physician on Tuesday, 07/23/2015 at 8:30 AM.  Disposition  and Follow-up: Stable. Follow-[facility] 07/23/2015 8:30 [facility]     Discharge Instructions    Activity as tolerated - No restrictions    Complete by:  As directed      Diet general    Complete by:  As directed            DISCHARGE EXAM:  General: Alert, awake, oriented x3, in no apparent distress.  Vital signs: BP 96/60, HR 90, T 98 F (36.7 C), temperature source Oral, RR 14, height 5' (1.524 m), weight 125 lb 11.2 oz (57.017 kg), last menstrual period 07/12/2015, SpO2 98 % on RA. HEENT: [place]/AT PEERL, EOMI, anicteric Neck: Trachea midline, no masses, no thyromegal,y no JVD, no carotid bruit OROPHARYNX: Moist, No exudate/ erythema/lesions.  Heart: Regular rate and rhythm, without murmurs, rubs, gallops or S3. PMI non-displaced. Exam reveals no decreased pulses. Pulmonary/Chest: Normal effort. Breath sounds normal. No. Apnea. Clear to auscultation,no stridor,  no wheezing and no rhonchi  noted. No respiratory distress and no tenderness noted. Abdomen: Soft, nontender, nondistended, normal bowel sounds, no masses no hepatosplenomegaly noted. No fluid wave and no ascites. There is no guarding or rebound. Neuro: Alert and oriented to person, place and time. Normal motor skills, Displays no atrophy or tremors and exhibits normal muscle tone.  No focal neurological deficits noted cranial nerves II through XII grossly intact. No sensory deficit noted.  Strength at baseline in bilateral upper and lower extremities. Gait normal. Musculoskeletal: No warm swelling or erythema around joints, no spinal tenderness noted. Psychiatric: Patient alert and oriented x3, good insight and cognition, good recent to remote recall. Mood, memory, affect and judgement normal Skin: Skin is warm and dry. No bruising, no ecchymosis and no rash noted. Pt is not diaphoretic. No erythema. No pallor     Recent Labs  07/18/15 0600  NA 138  K 3.6  CL 110  CO2 24  GLUCOSE 134*  BUN 6   CREATININE 0.44  CALCIUM 8.3*   No results for input(s): AST, ALT, ALKPHOS, BILITOT, PROT, ALBUMIN in the last 72 hours. No results for input(s): LIPASE, AMYLASE in the last 72 hours.  Recent Labs  07/18/15 0600  WBC 9.1  NEUTROABS 4.6  HGB 6.5*  HCT 18.4*  MCV 93.4  PLT 356     Total time spent including face to face and decision making was greater than 30 minutes  Signed: Garris Melhorn A. 07/19/2015, 10:44 AM

## 2015-07-19 NOTE — Care Management Note (Signed)
Case Management Note  Patient Details  Name: Brittany CampbellMiranda Foster MRN: [id number] Date of Birth: 02/04/1986  Subjective/Objective:                    Action/Plan:d/c home no needs or orders.   Expected Discharge Date:                  Expected Discharge Plan:  Home/Self Care  In-House Referral:     Discharge planning Services  CM Consult  Post Acute Care Choice:    Choice offered to:     DME Arranged:    DME Agency:     HH Arranged:    HH Agency:     Status of Service:  Completed, signed off  Medicare Important Message Given:    Date Medicare IM Given:    Medicare IM give by:    Date Additional Medicare IM Given:    Additional Medicare Important Message give by:     If discussed at Long Length of Stay Meetings, dates discussed:    Additional Comments:  Lanier ClamMahabir, Keen Ewalt, RN 07/19/2015, 2:15 PM

## 2015-07-19 NOTE — Progress Notes (Signed)
Pt's ambulatory oxygen was checked. She dropped to 85% while ambulating

## 2015-07-23 ENCOUNTER — Ambulatory Visit: Payer: Medicaid Other | Admitting: Internal Medicine

## 2015-07-24 ENCOUNTER — Telehealth: Payer: Self-pay

## 2015-07-24 NOTE — Telephone Encounter (Signed)
Pt is requesting a medication refill for her Percocet 10-325mg . Thanks!

## 2015-07-26 ENCOUNTER — Other Ambulatory Visit: Payer: Self-pay | Admitting: Internal Medicine

## 2015-07-26 DIAGNOSIS — D571 Sickle-cell disease without crisis: Secondary | ICD-10-CM

## 2015-07-26 DIAGNOSIS — G894 Chronic pain syndrome: Secondary | ICD-10-CM

## 2015-07-26 MED ORDER — OXYCODONE-ACETAMINOPHEN 10-325 MG PO TABS: 1.0000 | ORAL_TABLET | Freq: Four times a day (QID) | ORAL | Status: DC | PRN

## 2015-07-26 MED ORDER — MORPHINE SULFATE ER 30 MG PO TBCR
30.0000 mg | EXTENDED_RELEASE_TABLET | Freq: Two times a day (BID) | ORAL | Status: DC
Start: 2015-07-26 — End: 2015-09-06

## 2015-07-26 MED ORDER — DULOXETINE HCL 60 MG PO CPEP: 60.0000 mg | ORAL_CAPSULE | Freq: Every day | ORAL | Status: DC

## 2015-07-26 NOTE — Telephone Encounter (Signed)
Refill request for percocet 10/325mg . LOV. Please advise. Thanks!

## 2015-07-30 ENCOUNTER — Other Ambulatory Visit: Payer: Self-pay | Admitting: Internal Medicine

## 2015-07-30 ENCOUNTER — Telehealth (HOSPITAL_COMMUNITY): Payer: Self-pay | Admitting: *Deleted

## 2015-07-30 NOTE — Telephone Encounter (Signed)
Pt called with complaints of 8/10 pain in legs, hips and back not relieved with home medications. Dr.Jegede notified. Pt advised to stay home, drink plenty of fluids and continue to treat pain with home medications. Pt also made aware that her prescriptions were ready for pick up.   Deshane Cotroneo, [name],RN

## 2015-08-01 ENCOUNTER — Telehealth (HOSPITAL_COMMUNITY): Payer: Self-pay | Admitting: *Deleted

## 2015-08-01 NOTE — Telephone Encounter (Signed)
Called patient back and told her to stay home, continue to take pain meds, hydrate and rest after speaking with the provider. Pt responded that she "had called yesterday and was told the same thing". Pt hung up phone before RN could finish talking to her.

## 2015-08-01 NOTE — Telephone Encounter (Signed)
Pt called [facility] to [facility]. Pt stated her pain is in her legs, hips and back.  Took last pain med at 7am, MS Contin and Oxycodone. Stated her pain was 8/10. Pt denies fever, abd pain, diarrhea, chest pain, nausea and vomiting. Will check with the provider and give her a call back. Pt voiced understanding.

## 2015-08-02 ENCOUNTER — Telehealth (HOSPITAL_COMMUNITY): Payer: Self-pay | Admitting: *Deleted

## 2015-08-02 ENCOUNTER — Telehealth: Payer: Self-pay | Admitting: Family Medicine

## 2015-08-02 NOTE — Telephone Encounter (Signed)
Called complaining of pain in back, legs and top of stomach. Rating pain intensity 9/10. Denies fever, chest pain, N/V or diarrhea. Took oxycodone 10mg  at 7 am and MS Contin 30 mg at 4 am this morning without relief. Wants to come in for treatment.

## 2015-08-02 NOTE — Telephone Encounter (Signed)
Received call from Dr. Nino Glowim Tide, emergency room physician, [place], [place]. [name] is currently in the ER and was discharged from inpatient services 3 days ago. States that [name] became upset when IV medications were discontinued and wanted to leave inpatient services immediately. Patient returned to ER today with similar complaints of pain. Pain is consistent with chronic pain related to sickle cell anemia. I informed Dr. Elisha Ponderide that patient has been calling the day infusion center daily for treatment with IV medications. She picked up a prescription for opiate medications on  07/30/2015. According to [place] substance reporting system, patient had Rx filled on 07/30/2015.   [name] called office this am. She has an appointment scheduled for Thursday, June 8th at 2 pm for further medication management.    Massie MaroonHollis,Kalvyn Desa M, FNP

## 2015-08-02 NOTE — Telephone Encounter (Signed)
Returned call to patient concerning pain. Spoke with L. [name] NP about patient's complaints and she advises for patient to continue taking her medication, hydrate and make sure to keep her office follow up appointments for management of this pain. Patient keeps saying she can't believe she can't come in for treatment. Wants to make a follow up appointment with the office. Notified office of need for appointment.

## 2015-08-05 ENCOUNTER — Telehealth (HOSPITAL_COMMUNITY): Payer: Self-pay | Admitting: *Deleted

## 2015-08-05 NOTE — Telephone Encounter (Signed)
Called in complaining of pain in legs, back and hips. Rates pain 8/10. Denies chest pain, abdominal pain , N/V, diarrhea or fever. Took MS Contin 30 mg at 6 am and oxycodone 10 mg at 7 am without relief. Wants to come in for treatment. Patient aware we will call her back. Notified L. [name] NP about symptoms.

## 2015-08-05 NOTE — Telephone Encounter (Signed)
Called patient to find out ETA. She is still at home and upon conversation about provider availability today, she decided not to come today and [facility].

## 2015-08-05 NOTE — Telephone Encounter (Signed)
Patient [facility] per L. [name] NP. Patient agrees and will be here by 10 am.

## 2015-08-06 ENCOUNTER — Non-Acute Institutional Stay (HOSPITAL_COMMUNITY)
Admission: AD | Admit: 2015-08-06 | Discharge: 2015-08-06 | Disposition: A | Discharge status: Home / Self Care | Payer: Medicaid Other | Source: Ambulatory Visit | Attending: Internal Medicine | Admitting: Internal Medicine

## 2015-08-06 ENCOUNTER — Encounter (HOSPITAL_COMMUNITY): Payer: Self-pay | Admitting: Hematology

## 2015-08-06 ENCOUNTER — Telehealth (HOSPITAL_COMMUNITY): Payer: Self-pay | Admitting: Hematology

## 2015-08-06 DIAGNOSIS — Z79899 Other long term (current) drug therapy: Secondary | ICD-10-CM | POA: Diagnosis not present

## 2015-08-06 DIAGNOSIS — Z791 Long term (current) use of non-steroidal anti-inflammatories (NSAID): Secondary | ICD-10-CM | POA: Diagnosis not present

## 2015-08-06 DIAGNOSIS — D5700 Hb-SS disease with crisis, unspecified: Secondary | ICD-10-CM | POA: Diagnosis present

## 2015-08-06 LAB — CBC WITH DIFFERENTIAL/PLATELET
BASOS ABS: 0 10*3/uL (ref 0.0–0.1)
Basophils Relative: 0 %
EOS ABS: 0 10*3/uL (ref 0.0–0.7)
Eosinophils Relative: 0 %
HEMATOCRIT: 26.3 % — AB (ref 36.0–46.0)
Hemoglobin: 9.2 g/dL — ABNORMAL LOW (ref 12.0–15.0)
LYMPHS ABS: 2.5 10*3/uL (ref 0.7–4.0)
LYMPHS PCT: 21 %
MCH: 33.3 pg (ref 26.0–34.0)
MCHC: 35 g/dL (ref 30.0–36.0)
MCV: 95.3 fL (ref 78.0–100.0)
MONOS PCT: 7 %
Monocytes Absolute: 0.8 10*3/uL (ref 0.1–1.0)
Neutro Abs: 8.7 10*3/uL — ABNORMAL HIGH (ref 1.7–7.7)
Neutrophils Relative %: 72 %
Platelets: 286 10*3/uL (ref 150–400)
RBC: 2.76 MIL/uL — AB (ref 3.87–5.11)
RDW: 23.4 % — AB (ref 11.5–15.5)
WBC: 12 10*3/uL — AB (ref 4.0–10.5)

## 2015-08-06 LAB — RETICULOCYTES
RBC.: 2.76 MIL/uL — AB (ref 3.87–5.11)
RETIC COUNT ABSOLUTE: 380.9 10*3/uL — AB (ref 19.0–186.0)
RETIC CT PCT: 13.8 % — AB (ref 0.4–3.1)

## 2015-08-06 LAB — URINALYSIS, ROUTINE W REFLEX MICROSCOPIC
BILIRUBIN URINE: NEGATIVE
GLUCOSE, UA: NEGATIVE mg/dL
HGB URINE DIPSTICK: NEGATIVE
KETONES UR: NEGATIVE mg/dL
LEUKOCYTES UA: NEGATIVE
Nitrite: NEGATIVE
PH: 7 (ref 5.0–8.0)
PROTEIN: NEGATIVE mg/dL
Specific Gravity, Urine: 1.011 (ref 1.005–1.030)

## 2015-08-06 LAB — COMPREHENSIVE METABOLIC PANEL
ALT: 26 U/L (ref 14–54)
ANION GAP: 5 (ref 5–15)
AST: 39 U/L (ref 15–41)
Albumin: 4 g/dL (ref 3.5–5.0)
Alkaline Phosphatase: 90 U/L (ref 38–126)
BILIRUBIN TOTAL: 1.6 mg/dL — AB (ref 0.3–1.2)
CALCIUM: 8.9 mg/dL (ref 8.9–10.3)
CHLORIDE: 110 mmol/L (ref 101–111)
CO2: 25 mmol/L (ref 22–32)
CREATININE: 0.35 mg/dL — AB (ref 0.44–1.00)
Glucose, Bld: 149 mg/dL — ABNORMAL HIGH (ref 65–99)
Potassium: 3 mmol/L — ABNORMAL LOW (ref 3.5–5.1)
Sodium: 140 mmol/L (ref 135–145)
TOTAL PROTEIN: 7.2 g/dL (ref 6.5–8.1)

## 2015-08-06 MED ORDER — HYDROMORPHONE HCL 2 MG/ML IJ SOLN
1.0000 mg | INTRAMUSCULAR | Status: AC
  Administered 2015-08-06 (×2): 1 mg via SUBCUTANEOUS
  Filled 2015-08-06 (×2): qty 1

## 2015-08-06 MED ORDER — HEPARIN SOD (PORK) LOCK FLUSH 100 UNIT/ML IV SOLN
500.0000 [IU] | INTRAVENOUS | Status: AC | PRN
  Administered 2015-08-06: 500 [IU]
  Filled 2015-08-06: qty 5

## 2015-08-06 MED ORDER — SODIUM CHLORIDE 0.9 % IV SOLN
25.0000 mg | INTRAVENOUS | Status: DC | PRN
  Administered 2015-08-06: 25 mg via INTRAVENOUS
  Filled 2015-08-06 (×3): qty 0.5

## 2015-08-06 MED ORDER — SENNOSIDES-DOCUSATE SODIUM 8.6-50 MG PO TABS: 1.0000 | ORAL_TABLET | Freq: Two times a day (BID) | ORAL | Status: DC

## 2015-08-06 MED ORDER — ONDANSETRON HCL 4 MG/2ML IJ SOLN: 4.0000 mg | Freq: Four times a day (QID) | INTRAMUSCULAR | Status: DC | PRN

## 2015-08-06 MED ORDER — HYDROMORPHONE HCL 1 MG/ML IJ SOLN: 1.0000 mg | Freq: Once | INTRAMUSCULAR | Status: DC

## 2015-08-06 MED ORDER — NALOXONE HCL 0.4 MG/ML IJ SOLN: 0.4000 mg | INTRAMUSCULAR | Status: DC | PRN

## 2015-08-06 MED ORDER — DIPHENHYDRAMINE HCL 25 MG PO CAPS: 25.0000 mg | ORAL_CAPSULE | ORAL | Status: DC | PRN

## 2015-08-06 MED ORDER — FOLIC ACID 1 MG PO TABS: 1.0000 mg | ORAL_TABLET | Freq: Every day | ORAL | Status: DC

## 2015-08-06 MED ORDER — KETOROLAC TROMETHAMINE 30 MG/ML IJ SOLN
30.0000 mg | Freq: Four times a day (QID) | INTRAMUSCULAR | Status: DC
  Administered 2015-08-06: 30 mg via INTRAVENOUS
  Filled 2015-08-06 (×2): qty 1

## 2015-08-06 MED ORDER — SODIUM CHLORIDE 0.9% FLUSH
10.0000 mL | INTRAVENOUS | Status: AC | PRN
  Administered 2015-08-06: 10 mL

## 2015-08-06 MED ORDER — ALTEPLASE 2 MG IJ SOLR
2.0000 mg | Freq: Once | INTRAMUSCULAR | Status: AC
  Administered 2015-08-06: 2 mg
  Filled 2015-08-06: qty 2

## 2015-08-06 MED ORDER — SODIUM CHLORIDE 0.9% FLUSH: 9.0000 mL | INTRAVENOUS | Status: DC | PRN

## 2015-08-06 MED ORDER — HYDROMORPHONE 1 MG/ML IV SOLN
INTRAVENOUS | Status: DC
  Administered 2015-08-06: 9.5 mg via INTRAVENOUS
  Administered 2015-08-06: 14:00:00 via INTRAVENOUS
  Filled 2015-08-06 (×2): qty 25

## 2015-08-06 MED ORDER — DEXTROSE-NACL 5-0.45 % IV SOLN
INTRAVENOUS | Status: DC
  Administered 2015-08-06: 14:00:00 via INTRAVENOUS

## 2015-08-06 MED ORDER — HYDROMORPHONE HCL 2 MG/ML IJ SOLN
1.0000 mg | INTRAMUSCULAR | Status: AC
  Administered 2015-08-06: 1 mg via INTRAVENOUS
  Filled 2015-08-06: qty 1

## 2015-08-06 MED ORDER — POLYETHYLENE GLYCOL 3350 17 G PO PACK
17.0000 g | PACK | Freq: Every day | ORAL | Status: DC | PRN
  Filled 2015-08-06: qty 1

## 2015-08-06 NOTE — Discharge Summary (Signed)
Physician Discharge Summary  Brittany Foster [id number]:[id number] DOB: 04/06/1985 DOA: 08/06/2015  PCP: Jeanann LewandowskyJEGEDE, Brittany Hyson, MD  Admit date: 08/06/2015  Discharge date: 08/06/2015  Time spent: 30 minutes  Discharge Diagnoses:  Active Problems:   Sickle cell anemia with pain [facility](HCC)   Discharge Condition: Stable  Diet recommendation: Regular  Filed Weights   08/06/15 0953  Weight: 125 lb (56.7 kg)   History of present illness:    Brittany Foster is a 30 y.o. female with history of sickle cell disease came to the day hospital today with complaints of generalized body pain especially in her hips and lower back for the past few days not responding to her home medications. She rated her pain at a 10 out of 10, she denies any fever, no urinary symptoms, no headache, no shortness of breath, no chest pain, no nausea vomiting or diarrhea. Her pain is similar to her usual sickle cell crisis. She has tried to stay hydrated but all home regimen not helping. She called the day hospital to be managed appropriately.  Hospital Course:   Brittany CampbellMiranda Foster was admitted to the day hospital with sickle cell painful crisis. Patient was treated with weight based IV Dilaudid PCA, IV Toradol as well as IV fluids. Ivalee showed significant improvement symptomatically, pain improved from 10 to 5 out of 10 at the time of discharge. Brittany Foster will follow-up at the clinic as previously scheduled, continue with home medications as per prior to admission.  Discharge Exam: Filed Vitals:   08/06/15 1524 08/06/15 1614  BP: 107/64 109/67  Pulse: 82 81  Temp:    Resp: 15 17   General appearance: alert, cooperative and no distress Eyes: conjunctivae/corneas clear. PERRL, EOM's intact. Fundi benign. Neck: no adenopathy, no carotid bruit, no JVD, supple, symmetrical, trachea midline and thyroid not enlarged, symmetric, no tenderness/mass/nodules Back: symmetric, no curvature. ROM normal. No CVA tenderness. Resp: clear to auscultation  bilaterally Chest wall: no tenderness Cardio: regular rate and rhythm, S1, S2 normal, no murmur, click, rub or gallop GI: soft, non-tender; bowel sounds normal; no masses, no organomegaly Extremities: extremities normal, atraumatic, no cyanosis or edema Pulses: 2+ and symmetric Skin: Skin color, texture, turgor normal. No rashes or lesions Neurologic: Grossly normal  Discharge Instructions We discussed the need for good hydration, monitoring of hydration status, avoidance of heat, cold, stress, and infection triggers. We discussed the need to be compliant with taking Hydrea. [name] was reminded of the need to seek medical attention of any symptoms of bleeding, anemia, or infection occurs.  Current Discharge Medication List    START taking these medications   Details  HYDROmorphone (DILAUDID) 1 MG/ML injection Inject 1 mL (1 mg total) into the vein once. Qty: 1 mL, Refills: 0      CONTINUE these medications which have NOT CHANGED   Details  DULoxetine (CYMBALTA) 60 MG capsule Take 1 capsule (60 mg total) by mouth daily. Qty: 30 capsule, Refills: 3   Associated Diagnoses: Chronic pain syndrome    folic acid (FOLVITE) 1 MG tablet Take 1 tablet (1 mg total) by mouth daily. 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 MAY TAKE WITH FOOD TO MINIMIZE GI SIDE EFFECTS. Qty: 60 capsule, Refills: 3    ibuprofen (ADVIL,MOTRIN) 200 MG tablet Take 200 mg by mouth every 6 (six) hours as needed for moderate pain.    morphine (MS CONTIN) 30 MG  12 hr tablet Take 1 tablet (30 mg total) by mouth every 12 (twelve) hours. Qty: 30 tablet, Refills: 0   Associated Diagnoses: Hb-SS disease without crisis (HCC)    oxyCODONE-acetaminophen (PERCOCET) 10-325 MG tablet Take 1 tablet by mouth every 6 (six) hours as needed for pain. Qty: 60 tablet, Refills: 0   Associated Diagnoses: Hb-SS  disease without crisis (HCC)    Topiramate ER 100 MG CP24 Take 100 mg by mouth at bedtime.    Cholecalciferol (VITAMIN D3) 5000 UNITS CAPS Take 1 capsule (5,000 Units total) by mouth daily. Qty: 30 capsule, Refills: 5   Associated Diagnoses: Vitamin D deficiency    gabapentin (NEURONTIN) 300 MG capsule Take 1 capsule (300 mg total) by mouth 3 (three) times daily. Qty: 90 capsule, Refills: 2    !! potassium chloride SA (K-DUR,KLOR-CON) 20 MEQ tablet Take 1 tablet (20 mEq total) by mouth daily. Qty: 30 tablet, Refills: 2   Associated Diagnoses: Hypokalemia    !! potassium chloride SA (K-DUR,KLOR-CON) 20 MEQ tablet TAKE 1 TABLET BY MOUTH DAILY. Qty: 30 tablet, Refills: 3     !! - Potential duplicate medications found. Please discuss with provider.     Allergies  Allergen Reactions  . Ultram [Tramadol] Other (See Comments)    seizures  . Zofran [Ondansetron Hcl] Nausea And Vomiting  . Buprenorphine Hcl Hives and Rash    Shaking Tolerates Percocet, Norco, and buprenorphine  . Morphine And Related Hives, Rash and Other (See Comments)    Shaking Tolerates Percocet, Norco, and buprenorphine  . Tape Rash     Significant Diagnostic Studies: Dg Chest 2 View  07/12/2015  CLINICAL DATA:  Sickle cell crisis.  Chest pain for 1 week. EXAM: CHEST - 2 VIEW COMPARISON:  Two-view chest x-ray 03/07/2015 and 03/03/2015 FINDINGS: The heart size is normal. No significant edema or effusion is present to suggest failure. No focal airspace disease is present. A right-sided Port-A-Cath is again seen. The catheter has changed position. The tip appears to be within the azygos vein. IMPRESSION: 1. Altered position of Port-A-Cath. The tip is suspected to be in the as azygous vein. If the catheter has been replaced since 03/07/15, I cannot exclude arterial positioning. CT or CTA of the chest would be useful for further evaluation. If the catheter has not been manipulated since January, follow-up with  interventional radiology is recommended for catheter repositioning. These results were called by telephone at the time of interpretation on 07/12/2015 at 6:07 pm to Dr. Carlena Sax, who verbally acknowledged these results. Electronically Signed   By: Marin Roberts M.D.   On: 07/12/2015 18:08   Ct Angio Chest Pe W/cm &/or Wo Cm  07/12/2015  CLINICAL DATA:  Generalized weakness.  Sickle cell disease. EXAM: CT ANGIOGRAPHY CHEST WITH CONTRAST TECHNIQUE: Multidetector CT imaging of the chest was performed using the standard protocol during bolus administration of intravenous contrast. Multiplanar CT image reconstructions and MIPs were obtained to evaluate the vascular anatomy. CONTRAST:  100 mL Isovue 370 intravenous COMPARISON:  Radiographs 07/12/2015 FINDINGS: Cardiovascular: There is good opacification of the pulmonary arteries. There is no pulmonary embolism. The thoracic aorta is normal in caliber and intact. The right subclavian central line enters the SVC and continues posteriorly in the azygos vein several cm back to the midline prevertebral portion of the vein. Lungs: There is stable scarring in the right lateral lung base, unchanged from 11/01/2014. The lungs are otherwise clear. Central airways: Normal Effusions: None Lymphadenopathy: None Esophagus: Unremarkable Upper abdomen: No  significant abnormality Musculoskeletal: Scattered sclerosis and numerous skeletal structures, consistent with the described history of sickle cell disease. Review of the MIP images confirms the above findings. IMPRESSION: 1. No acute findings are evident in the chest. 2. The right subclavian central line enters the SVC and and continues posteriorly in the azygos vein. Consider interventional radiology consultation for catheter repositioning. 3. These results will be called to the ordering clinician or representative by the Radiologist Assistant, and communication documented in the PACS or zVision Dashboard. Electronically  Signed   By: Ellery Plunk M.D.   On: 07/12/2015 21:15   Ir Fluoro Guide Cv Line Right  07/17/2015  CLINICAL DATA:  Sickle cell anemia with malfunctioning port placed at an outside institution in February. The port catheter is coiled and extends into the azygos vein by CT. EXAM: REVISION OF TUNNELED CENTRAL VENOUS PORT-A-CATH WITH FLUOROSCOPY ANESTHESIA/SEDATION: 4.0 mg IV Versed; 150 mcg IV Fentanyl. Total Moderate Sedation Time 51 minutes. The patient's level of consciousness and physiologic status were continuously monitored during the procedure by Radiology nursing. MEDICATIONS: 2 g IV Ancef. As antibiotic prophylaxis, Ancef was ordered pre-procedure and administered intravenously within one hour of incision. FLUOROSCOPY TIME:  1 minute. PROCEDURE: The procedure, risks, benefits, and alternatives were explained to the patient. Questions regarding the procedure were encouraged and answered. The patient understands and consents to the procedure. A time-out was performed prior to initiating the procedure. The right neck and chest were prepped with chlorhexidine in a sterile fashion, and a sterile drape was applied covering the operative field. Maximum barrier sterile technique with sterile gowns and gloves were used for the procedure. Local anesthesia was provided with 1% lidocaine. A skin incision was made overlying the right chest Port-A-Cath at the level of a previous healed incision. Utilizing sharp and blunt dissection, the port was freed from surrounding scar tissue. Retention sutures were cut and removed. The port catheter was then detached from the port reservoir. A guidewire was advanced through the catheter under fluoroscopy to straighten the catheter. The guidewire was advanced through the right atrium and into the inferior vena cava. The port catheter was then reattached to the port reservoir and the port placed back into the pocket. The catheter was accessed, aspirated and flushed with saline.  Final fluoroscopic image confirms catheter positioning after revision. The port pocket incision was closed with subcutaneous 3-0 Monocryl and subcuticular 4-0 Vicryl. Dermabond was applied to the incision. COMPLICATIONS: None.  No pneumothorax. FINDINGS: Initial fluoroscopy confirms malpositioning of the Port-A-Cath with the catheter tubing demonstrating coiling in the subcutaneous tissues and subclavian vein and the tip extending into the azygos vein. After catheter detachment and guidewire advancement, the catheter was able to be straightened out and properly positioned in the SVC. After revision, catheter course extends appropriately to the level of the cavoatrial junction. Aspiration yields free return of blood and the port flushes normally. IMPRESSION: Revision of malpositioned Port-A-Cath. The catheter was able to be re- directed into the SVC from malpositioning in the azygos vein with guidewire advancement. The port was reconnected and placed back into the original pocket. The port was accessed and can be used immediately. Electronically Signed   By: Irish Lack M.D.   On: 07/17/2015 17:20    Signed:  Jeanann Lewandowsky MD, MHA, FACP, FAAP, CPE   08/06/2015, 5:01 PM

## 2015-08-06 NOTE — Progress Notes (Signed)
Pt received to the [facility] for treatment.  Pt's porta cath was accessed and blood returned after port was TPA'd. Pt was treated with SQ Dilaudid, IV fluids, Toradol and Dilaudid PCA. Pt's pain on admission was 8/10 and down to 6/10 at discharge. Pt's porta cath was flushed per protocol with saline and heparin. Pt voiced understanding of discharge instructions. Pt was alert, oriented and ambulatory at discharge.

## 2015-08-06 NOTE — H&P (Signed)
[facility] History and Physical  Irini Leet YQM:[id number] DOB: 1986-01-09 DOA: 08/06/2015  PCP: Jeanann Lewandowsky, MD   Chief Complaint:  Chief Complaint  Patient presents with  . Sickle Cell Pain Crisis    HPI: Brittany Foster is a 30 y.o. female with history of sickle cell disease came to the day hospital today with complaints of generalized body pain especially in her hips and lower back for the past few days not responding to her home medications. She rated her pain at a 10 out of 10, she denies any fever, no urinary symptoms, no headache, no shortness of breath, no chest pain, no nausea vomiting or diarrhea. Her pain is similar to her usual sickle cell crisis. She has tried to stay hydrated but all home regimen not helping. She called the day hospital to be managed appropriately.  Systemic Review: General: The patient denies anorexia, fever, weight loss Cardiac: Denies chest pain, syncope, palpitations, pedal edema  Respiratory: Denies cough, shortness of breath, wheezing GI: Denies severe indigestion/heartburn, abdominal pain, nausea, vomiting, diarrhea and constipation GU: Denies hematuria, incontinence, dysuria  Musculoskeletal: Denies arthritis  Skin: Denies suspicious skin lesions Neurologic: Denies focal weakness or numbness, change in vision  Past Medical History  Diagnosis Date  . Sickle cell anemia (HCC)   . Anemia     Past Surgical History  Procedure Laterality Date  . Tubal ligation    . Cholecystectomy    . Cesarean section    . Port a cath placement Right     about 6-7 years ago  . Cholecystectomy  2000  . Multiple tooth extractions N/A     Allergies  Allergen Reactions  . Ultram [Tramadol] Other (See Comments)    seizures  . Zofran [Ondansetron Hcl] Nausea And Vomiting  . Buprenorphine Hcl Hives and Rash    Shaking Tolerates Percocet, Norco, and buprenorphine  . Morphine And Related Hives, Rash and Other (See Comments)     Shaking Tolerates Percocet, Norco, and buprenorphine  . Tape Rash    Family History  Problem Relation Age of Onset  . Sickle cell anemia Other   . Sickle cell trait Father   . Sickle cell trait Mother       Prior to Admission medications   Medication Sig Start Date End Date Taking? Authorizing Provider  DULoxetine (CYMBALTA) 60 MG capsule Take 1 capsule (60 mg total) by mouth daily. 07/26/15  Yes Quentin Angst, MD  folic acid (FOLVITE) 1 MG tablet Take 1 tablet (1 mg total) by mouth daily. 01/08/15  Yes Quentin Angst, MD  hydroxyurea (HYDREA) 500 MG capsule TAKE 2 CAPSULES BY MOUTH DAILY. MAY TAKE WITH FOOD TO MINIMIZE GI SIDE EFFECTS. 06/26/15  Yes Quentin Angst, MD  ibuprofen (ADVIL,MOTRIN) 200 MG tablet Take 200 mg by mouth every 6 (six) hours as needed for moderate pain.   Yes Historical Provider, MD  morphine (MS CONTIN) 30 MG 12 hr tablet Take 1 tablet (30 mg total) by mouth every 12 (twelve) hours. 07/26/15  Yes Quentin Angst, MD  oxyCODONE-acetaminophen (PERCOCET) 10-325 MG tablet Take 1 tablet by mouth every 6 (six) hours as needed for pain. 07/26/15  Yes Quentin Angst, MD  Topiramate ER 100 MG CP24 Take 100 mg by mouth at bedtime.   Yes Historical Provider, MD  Cholecalciferol (VITAMIN D3) 5000 UNITS CAPS Take 1 capsule (5,000 Units total) by mouth daily. Patient not taking: Reported on 07/12/2015 01/08/15   Quentin Angst, MD  gabapentin (NEURONTIN) 300 MG capsule Take 1 capsule (300 mg total) by mouth 3 (three) times daily. Patient not taking: Reported on 07/12/2015 02/14/14   Altha HarmMichelle A [name], MD  potassium chloride SA (K-DUR,KLOR-CON) 20 MEQ tablet Take 1 tablet (20 mEq total) by mouth daily. Patient not taking: Reported on 07/12/2015 01/08/15   Quentin Angstlugbemiga E Dory Demont, MD  potassium chloride SA (K-DUR,KLOR-CON) 20 MEQ tablet TAKE 1 TABLET BY MOUTH DAILY. 06/26/15   Quentin Angstlugbemiga E Rovena Hearld, MD     Physical Exam: Filed Vitals:   08/06/15 0953  BP:  117/74  Pulse: 98  Temp: 98.8 F (37.1 C)  TempSrc: Oral  Resp: 20  Height: 5' (1.524 m)  Weight: 56.7 kg (125 lb)  SpO2: 100%    General: Alert, awake, afebrile, anicteric, not in obvious distress HEENT: Normocephalic and Atraumatic, Mucous membranes pink                PERRLA; EOM intact; No scleral icterus,                 Nares: Patent, Oropharynx: Clear, Fair Dentition                 Neck: FROM, no cervical lymphadenopathy, thyromegaly, carotid bruit or JVD;  CHEST WALL: No tenderness  CHEST: Normal respiration, clear to auscultation bilaterally  HEART: Regular rate and rhythm; no murmurs rubs or gallops  BACK: No kyphosis or scoliosis; no CVA tenderness  ABDOMEN: Positive Bowel Sounds, soft, non-tender; no masses, no organomegaly EXTREMITIES: No cyanosis, clubbing, or edema SKIN:  no rash or ulceration  CNS: Alert and Oriented x 4, Nonfocal exam, CN 2-12 intact  Labs on Admission:  Basic Metabolic Panel: No results for input(s): NA, K, CL, CO2, GLUCOSE, BUN, CREATININE, CALCIUM, MG, PHOS in the last 168 hours. Liver Function Tests: No results for input(s): AST, ALT, ALKPHOS, BILITOT, PROT, ALBUMIN in the last 168 hours. No results for input(s): LIPASE, AMYLASE in the last 168 hours. No results for input(s): AMMONIA in the last 168 hours. CBC: No results for input(s): WBC, NEUTROABS, HGB, HCT, MCV, PLT in the last 168 hours. Cardiac Enzymes: No results for input(s): CKTOTAL, CKMB, CKMBINDEX, TROPONINI in the last 168 hours.  BNP (last 3 results) No results for input(s): BNP in the last 8760 hours.  ProBNP (last 3 results) No results for input(s): PROBNP in the last 8760 hours.  CBG: No results for input(s): GLUCAP in the last 168 hours.   Assessment/Plan Active Problems:   Sickle cell anemia with pain (HCC)   Admits to the Day Hospital  IVF D5 .45% Saline @ 175 mls/hour  Weight based Dilaudid PCA started within 30 minutes of admission  IV Toradol 30 mg  Q 6 H  Monitor vitals very closely, Re-evaluate pain scale every hour  2 L of Oxygen by [place]  Patient will be re-evaluated for pain in the context of function and relationship to baseline as care progresses.  If no significant relieve from pain (remains above 5/10) will transfer patient to inpatient services for further evaluation and management  Code Status: Full  Family Communication: None  DVT Prophylaxis: Ambulate as tolerated   Time spent: 35 Minutes  Brittany Torelli, MD, MHA, FACP, FAAP, CPE  If 7PM-7AM, please contact night-coverage [email or web address] 08/06/2015, 10:08 AM

## 2015-08-06 NOTE — Progress Notes (Signed)
Port-a-cath checked.  Blood returned noted.  Discarded 10 mls of blood, then drew labs off.  Flushed.  IV therapy started per order.

## 2015-08-06 NOTE — Progress Notes (Signed)
MD at bedside attempting IV.  Verbal orders given to attempt IV in foot.  IV successful.  RN will continue to monitor

## 2015-08-06 NOTE — Telephone Encounter (Signed)
Patient C/O generalized pain 8/10 on pain scale. Denies Chest Pain, SOB, or difficulty breathing.  Patient states she has no improvement with home medications.  I placed caller on hold and spoke with Brittany Foster.  Advised patient it is ok for her to come to day hospital.

## 2015-08-06 NOTE — Progress Notes (Signed)
Port-a-cath accessed by IV team.  No blood return noted.  TPA placed by this nurse.  Will continue to montior

## 2015-08-08 ENCOUNTER — Telehealth (HOSPITAL_COMMUNITY): Payer: Self-pay | Admitting: *Deleted

## 2015-08-08 ENCOUNTER — Ambulatory Visit (INDEPENDENT_AMBULATORY_CARE_PROVIDER_SITE_OTHER): Payer: Medicaid Other | Admitting: Family Medicine

## 2015-08-08 ENCOUNTER — Encounter: Payer: Self-pay | Admitting: Family Medicine

## 2015-08-08 DIAGNOSIS — Z308 Encounter for other contraceptive management: Secondary | ICD-10-CM | POA: Diagnosis not present

## 2015-08-08 DIAGNOSIS — D571 Sickle-cell disease without crisis: Secondary | ICD-10-CM | POA: Diagnosis not present

## 2015-08-08 DIAGNOSIS — G894 Chronic pain syndrome: Secondary | ICD-10-CM | POA: Diagnosis not present

## 2015-08-08 DIAGNOSIS — Z3042 Encounter for surveillance of injectable contraceptive: Secondary | ICD-10-CM

## 2015-08-08 LAB — POCT URINE PREGNANCY: PREG TEST UR: NEGATIVE

## 2015-08-08 MED ORDER — GABAPENTIN 300 MG PO CAPS: 300.0000 mg | ORAL_CAPSULE | Freq: Three times a day (TID) | ORAL | Status: DC

## 2015-08-08 MED ORDER — MEDROXYPROGESTERONE ACETATE 150 MG/ML IM SUSP
150.0000 mg | Freq: Once | INTRAMUSCULAR | Status: AC
  Administered 2015-08-08: 150 mg via INTRAMUSCULAR

## 2015-08-08 MED ORDER — DULOXETINE HCL 60 MG PO CPEP: 60.0000 mg | ORAL_CAPSULE | Freq: Every day | ORAL | Status: DC

## 2015-08-08 MED ORDER — HYDROXYUREA 500 MG PO CAPS: ORAL_CAPSULE | ORAL | Status: DC

## 2015-08-08 MED ORDER — FOLIC ACID 1 MG PO TABS: 1.0000 mg | ORAL_TABLET | Freq: Every day | ORAL | Status: DC

## 2015-08-08 NOTE — Telephone Encounter (Signed)
[facility] would be [facility] today. Asked if she could come early to the day hospital for treatment before her appointment. States pain in legs, back and hip is 8/10 intensity. Took oxycodone 10 mg at 6 am this morning.   Advised patient to come for appointment today and she will be assessed at that time, per L. [name] NP. Patient states "ok".

## 2015-08-08 NOTE — Progress Notes (Signed)
Subjective:    Patient ID: Brittany Foster, female    DOB: [birth year], 30 y.o.   MRN: [id number]  HPI [name], a 30 year old female with a history of sickle cell anemia, HbSS presents for a follow up and medication management. She says that she has been having generalized pain related to sickle cell anemia. She says that current pain intensity is 8/10 and it described as constant and throbbing. She says that pain is no longer controlled on current medication regimen. She has not been taking MS Contin 30 mg consistently and she has not been taking hydroxyurea consistently over the past several months. She last had Percocet 10-325 mg this am with moderate relief. She denies fever, headache, dysuria, nausea, vomiting, or diarrhea.   Brittany Foster is also here to discuss starting depo provera for contraception. Patient is currently sexually active. She states that she is using barrier protection with sexual intercourse. It has been several years since last pap smear.  Past Medical History  Diagnosis Date  . Sickle cell anemia (HCC)   . Anemia    Immunization History  Administered Date(s) Administered  . Influenza,inj,Quad PF,36+ Mos 02/03/2014  . Meningococcal Conjugate 08/07/2014  . Pneumococcal Conjugate-13 06/11/2014  . Tdap 02/16/2014    Social History   Social History  . Marital Status: Single    Spouse Name: N/A  . Number of Children: N/A  . Years of Education: N/A   Occupational History  . Not on file.   Social History Main Topics  . Smoking status: Former Games developer  . Smokeless tobacco: Not on file     Comment: smokes once every couple of weeks.   . Alcohol Use: No  . Drug Use: No  . Sexual Activity: Yes    Birth Control/ Protection: Surgical   Other Topics Concern  . Not on file   Social History Narrative   Review of Systems  Constitutional: Negative.   HENT: Negative.   Eyes: Negative.  Negative for visual disturbance.  Respiratory: Negative.    Cardiovascular: Negative.   Gastrointestinal: Negative.   Endocrine: Negative.  Negative for polydipsia, polyphagia and polyuria.  Genitourinary: Negative.  Negative for dysuria, vaginal discharge and dyspareunia.  Musculoskeletal: Positive for myalgias and arthralgias.  Skin: Negative.   Allergic/Immunologic: Positive for immunocompromised state.  Neurological: Negative.   Hematological: Negative.   Psychiatric/Behavioral: Negative.  Negative for sleep disturbance and decreased concentration.       Objective:   Physical Exam  Constitutional: She is oriented to person, place, and time. She appears well-developed and well-nourished.  HENT:  Head: Normocephalic and atraumatic.  Right Ear: External ear normal.  Left Ear: External ear normal.  Nose: Nose normal.  Mouth/Throat: Oropharynx is clear and moist.  Eyes: Conjunctivae and EOM are normal. Pupils are equal, round, and reactive to light.  Neck: Normal range of motion.  Cardiovascular: Normal rate, regular rhythm, normal heart sounds and intact distal pulses.   Pulmonary/Chest: Effort normal and breath sounds normal.  Abdominal: Soft. Bowel sounds are normal.  Musculoskeletal: Normal range of motion.  Neurological: She is alert and oriented to person, place, and time. She has normal reflexes.  Skin: Skin is warm and dry.  Psychiatric: She has a normal mood and affect. Her behavior is normal. Judgment and thought content normal.      BP 107/65 mmHg  Pulse 98  Temp(Src) 98.4 F (36.9 C) (Oral)  Resp 16  Ht 5' (1.524 m)  Wt 128 lb (58.06  kg)  BMI 25.00 kg/m2  SpO2 100%  LMP 07/31/2015 Assessment & Plan:  1. Hb-SS disease without crisis (HCC)  Continue Hydrea 1000 mg daily. We discussed the importance of taking this medication consistently. There have been some probable  compliance issues here. I have discussed with her the great importance of following the treatment plan exactly as directed in order to achieve a good  medical outcome. We discussed the need for good hydration, monitoring of hydration status, avoidance of heat, cold, stress, and infection triggers. We discussed the risks and benefits of Hydrea, including bone marrow suppression, the possibility of GI upset, skin ulcers, hair thinning, and teratogenicity. The patient was reminded of the need to seek medical attention of any symptoms of bleeding, anemia, or infection. Continue folic acid 1 mg daily to prevent aplastic bone marrow crises.   Pulmonary evaluation - Patient denies severe recurrent wheezes, shortness of breath with exercise, or persistent cough. If these symptoms develop, pulmonary function tests with spirometry will be ordered, and if abnormal, plan on referral to Pulmonology for further evaluation.   Cardiac - Routine screening for pulmonary hypertension is not recommended.   Eye - Previously sent referral for opthalmology.  Immunization status - Patient is up to date with vaccinations  Acute and chronic painful episodes - We agreed on Percocet 10-325 mg every 4 hours for pain #90. She can request opiate medications on 08/14/2015. Will continue MS continue 30 mg every 12 hours. We discussed that pt is to receive her Schedule II prescriptions only from us. Pt is also aware that the prescription history is available to us online [facility]. Controlled substance agreement signed today. We reminded [name] that all patients receiving Schedule II narcotics must be seen for follow within one month of prescription being requested. We reviewed the terms of our pain agreement, including the need to keep medicines in a safe locked location away from children or pets, and the need to report excess sedation or constipation, measures to avoid constipation, and policies related to early refills and stolen prescriptions. According to the [place] Chronic Pain Initiative program, we have reviewed details related to analgesia, adverse effects, aberrant  behaviors. Reviewed [place] Substance Reporting system prior to prescribing opiate medications, no inconsistencies noted. Also, discussed utilization of the day infusion center and the difference between acute vs chronic pain. Medication changes reviewed with Dr. Nolene Bernheimlu Jegede prior to prescribing.   - folic acid (FOLVITE) 1 MG tablet; Take 1 tablet (1 mg total) by mouth daily.  Dispense: 90 tablet; Refill: 11 - hydroxyurea (HYDREA) 500 MG capsule; TAKE 2 CAPSULES BY MOUTH DAILY. MAY TAKE WITH FOOD TO MINIMIZE GI SIDE EFFECTS.  Dispense: 60 capsule; Refill: 3 - Pain Mgmt, Profile 8 w/Conf, U  2. Chronic pain syndrome - DULoxetine (CYMBALTA) 60 MG capsule; Take 1 capsule (60 mg total) by mouth daily.  Dispense: 30 capsule; Refill: 3 - gabapentin (NEURONTIN) 300 MG capsule; Take 1 capsule (300 mg total) by mouth 3 (three) times daily.  Dispense: 90 capsule; Refill: 2 - Pain Mgmt, Profile 8 w/Conf, U  3. Family planning, Depo-Provera contraception monitoring/administration Patient will need to return on August 30th for depo provera injection.  - POCT urine pregnancy - medroxyPROGESTERone (DEPO-PROVERA) injection 150 mg; Inject 1 mL (150 mg total) into the muscle once.   RTC: 1 month for sickle cell anemia, medication management and pap smear   Massie MaroonHollis,Lex Linhares M, FNP

## 2015-08-08 NOTE — Patient Instructions (Signed)
Next depo provera injection to be scheduled between August 25th-30th  Medroxyprogesterone injection [Contraceptive] What is this medicine? MEDROXYPROGESTERONE (me DROX ee proe JES te rone) contraceptive injections prevent pregnancy. They provide effective birth control for 3 months. Depo-subQ Provera 104 is also used for treating pain related to endometriosis. This medicine may be used for other purposes; ask your health care provider or pharmacist if you have questions. What should I tell my health care provider before I take this medicine? They need to know if you have any of these conditions: -frequently drink alcohol -asthma -blood vessel disease or a history of a blood clot in the lungs or legs -bone disease such as osteoporosis -breast cancer -diabetes -eating disorder (anorexia nervosa or bulimia) -high blood pressure -HIV infection or AIDS -kidney disease -liver disease -mental depression -migraine -seizures (convulsions) -stroke -tobacco smoker -vaginal bleeding -an unusual or allergic reaction to medroxyprogesterone, other hormones, medicines, foods, dyes, or preservatives -pregnant or trying to get pregnant -breast-feeding How should I use this medicine? Depo-Provera Contraceptive injection is given into a muscle. Depo-subQ Provera 104 injection is given under the skin. These injections are given by a health care professional. You must not be pregnant before getting an injection. The injection is usually given during the first 5 days after the start of a menstrual period or 6 weeks after delivery of a baby. Talk to your pediatrician regarding the use of this medicine in children. Special care may be needed. These injections have been used in female children who have started having menstrual periods. Overdosage: If you think you have taken too much of this medicine contact a poison control center or emergency room at once. NOTE: This medicine is only for you. Do not share  this medicine with others. What if I miss a dose? Try not to miss a dose. You must get an injection once every 3 months to maintain birth control. If you cannot keep an appointment, call and reschedule it. If you wait longer than 13 weeks between Depo-Provera contraceptive injections or longer than 14 weeks between Depo-subQ Provera 104 injections, you could get pregnant. Use another method for birth control if you miss your appointment. You may also need a pregnancy test before receiving another injection. What may interact with this medicine? Do not take this medicine with any of the following medications: -bosentan This medicine may also interact with the following medications: -aminoglutethimide -antibiotics or medicines for infections, especially rifampin, rifabutin, rifapentine, and griseofulvin -aprepitant -barbiturate medicines such as phenobarbital or primidone -bexarotene -carbamazepine -medicines for seizures like ethotoin, felbamate, oxcarbazepine, phenytoin, topiramate -modafinil -St. John's wort This list may not describe all possible interactions. Give your health care provider a list of all the medicines, herbs, non-prescription drugs, or dietary supplements you use. Also tell them if you smoke, drink alcohol, or use illegal drugs. Some items may interact with your medicine. What should I watch for while using this medicine? This drug does not protect you against HIV infection (AIDS) or other sexually transmitted diseases. Use of this product may cause you to lose calcium from your bones. Loss of calcium may cause weak bones (osteoporosis). Only use this product for more than 2 years if other forms of birth control are not right for you. The longer you use this product for birth control the more likely you will be at risk for weak bones. Ask your health care professional how you can keep strong bones. You may have a change in bleeding pattern or irregular periods. Many  females  stop having periods while taking this drug. If you have received your injections on time, your chance of being pregnant is very low. If you think you may be pregnant, see your health care professional as soon as possible. Tell your health care professional if you want to get pregnant within the next year. The effect of this medicine may last a long time after you get your last injection. What side effects may I notice from receiving this medicine? Side effects that you should report to your doctor or health care professional as soon as possible: -allergic reactions like skin rash, itching or hives, swelling of the face, lips, or tongue -breast tenderness or discharge -breathing problems -changes in vision -depression -feeling faint or lightheaded, falls -fever -pain in the abdomen, chest, groin, or leg -problems with balance, talking, walking -unusually weak or tired -yellowing of the eyes or skin Side effects that usually do not require medical attention (report to your doctor or health care professional if they continue or are bothersome): -acne -fluid retention and swelling -headache -irregular periods, spotting, or absent periods -temporary pain, itching, or skin reaction at site where injected -weight gain This list may not describe all possible side effects. Call your doctor for medical advice about side effects. You may report side effects to FDA at 1-800-FDA-1088. Where should I keep my medicine? This does not apply. The injection will be given to you by a health care professional. NOTE: This sheet is a summary. It may not cover all possible information. If you have questions about this medicine, talk to your doctor, pharmacist, or health care provider.    2016, [employer]vier/[employer]. (2008-03-09 18:37:56)

## 2015-08-08 NOTE — Progress Notes (Signed)
 Subjective:    Patient ID: Brittany Foster, female    DOB: 08/08/1985, 30 y.o.   MRN: [id number]  HPI Brittany Foster, a 30 year old female with a history of sickle cell anemia, HbSS presents for a follow up and medication management. She says that she has been having generalized pain related to sickle cell anemia. She says that current pain intensity is 8/10 and it described as constant and throbbing. She says that pain is no longer controlled on current medication regimen. She has not been taking MS Contin 30 mg consistently and she has not been taking hydroxyurea consistently over the past several months. She last had Percocet 10-325 mg this am with moderate relief. She denies fever, headache, dysuria, nausea, vomiting, or diarrhea.   Brittany Foster is also here to discuss starting depo provera for contraception. Patient is currently sexually active. She states that she is using barrier protection with sexual intercourse. It has been several years since last pap smear.  Past Medical History  Diagnosis Date  . Sickle cell anemia (HCC)   . Anemia    Immunization History  Administered Date(s) Administered  . Influenza,inj,Quad PF,36+ Mos 02/03/2014  . Meningococcal Conjugate 08/07/2014  . Pneumococcal Conjugate-13 06/11/2014  . Tdap 02/16/2014    Social History   Social History  . Marital Status: Single    Spouse Name: N/A  . Number of Children: N/A  . Years of Education: N/A   Occupational History  . Not on file.   Social History Main Topics  . Smoking status: Former Smoker  . Smokeless tobacco: Not on file     Comment: smokes once every couple of weeks.   . Alcohol Use: No  . Drug Use: No  . Sexual Activity: Yes    Birth Control/ Protection: Surgical   Other Topics Concern  . Not on file   Social History Narrative   Review of Systems  Constitutional: Negative.   HENT: Negative.   Eyes: Negative.  Negative for visual disturbance.  Respiratory: Negative.    Cardiovascular: Negative.   Gastrointestinal: Negative.   Endocrine: Negative.  Negative for polydipsia, polyphagia and polyuria.  Genitourinary: Negative.  Negative for dysuria, vaginal discharge and dyspareunia.  Musculoskeletal: Positive for myalgias and arthralgias.  Skin: Negative.   Allergic/Immunologic: Positive for immunocompromised state.  Neurological: Negative.   Hematological: Negative.   Psychiatric/Behavioral: Negative.  Negative for sleep disturbance and decreased concentration.       Objective:   Physical Exam  Constitutional: She is oriented to person, place, and time. She appears well-developed and well-nourished.  HENT:  Head: Normocephalic and atraumatic.  Right Ear: External ear normal.  Left Ear: External ear normal.  Nose: Nose normal.  Mouth/Throat: Oropharynx is clear and moist.  Eyes: Conjunctivae and EOM are normal. Pupils are equal, round, and reactive to light.  Neck: Normal range of motion.  Cardiovascular: Normal rate, regular rhythm, normal heart sounds and intact distal pulses.   Pulmonary/Chest: Effort normal and breath sounds normal.  Abdominal: Soft. Bowel sounds are normal.  Musculoskeletal: Normal range of motion.  Neurological: She is alert and oriented to person, place, and time. She has normal reflexes.  Skin: Skin is warm and dry.  Psychiatric: She has a normal mood and affect. Her behavior is normal. Judgment and thought content normal.      BP 107/65 mmHg  Pulse 98  Temp(Src) 98.4 F (36.9 C) (Oral)  Resp 16  Ht 5' (1.524 m)  Wt 128 lb (58.06   kg)  BMI 25.00 kg/m2  SpO2 100%  LMP 07/31/2015 Assessment & Plan:  1. Hb-SS disease without crisis (HCC)  Continue Hydrea 1000 mg daily. We discussed the importance of taking this medication consistently. There have been some probable  compliance issues here. I have discussed with her the great importance of following the treatment plan exactly as directed in order to achieve a good  medical outcome. We discussed the need for good hydration, monitoring of hydration status, avoidance of heat, cold, stress, and infection triggers. We discussed the risks and benefits of Hydrea, including bone marrow suppression, the possibility of GI upset, skin ulcers, hair thinning, and teratogenicity. The patient was reminded of the need to seek medical attention of any symptoms of bleeding, anemia, or infection. Continue folic acid 1 mg daily to prevent aplastic bone marrow crises.   Pulmonary evaluation - Patient denies severe recurrent wheezes, shortness of breath with exercise, or persistent cough. If these symptoms develop, pulmonary function tests with spirometry will be ordered, and if abnormal, plan on referral to Pulmonology for further evaluation.   Cardiac - Routine screening for pulmonary hypertension is not recommended.   Eye - Previously sent referral for opthalmology.  Immunization status - Patient is up to date with vaccinations  Acute and chronic painful episodes - We agreed on Percocet 10-325 mg every 4 hours for pain #90. She can request opiate medications on 08/14/2015. Will continue MS continue 30 mg every 12 hours. We discussed that pt is to receive her Schedule II prescriptions only from us. Pt is also aware that the prescription history is available to us online through the [facility]. Controlled substance agreement signed today. We reminded Brittany Foster that all patients receiving Schedule II narcotics must be seen for follow within one month of prescription being requested. We reviewed the terms of our pain agreement, including the need to keep medicines in a safe locked location away from children or pets, and the need to report excess sedation or constipation, measures to avoid constipation, and policies related to early refills and stolen prescriptions. According to the [place] Chronic Pain Initiative program, we have reviewed details related to analgesia, adverse effects, aberrant  behaviors. Reviewed [place] Substance Reporting system prior to prescribing opiate medications, no inconsistencies noted. Also, discussed utilization of the day infusion center and the difference between acute vs chronic pain. Medication changes reviewed with Dr. Nolene Bernheimlu Jegede prior to prescribing.   - folic acid (FOLVITE) 1 MG tablet; Take 1 tablet (1 mg total) by mouth daily.  Dispense: 90 tablet; Refill: 11 - hydroxyurea (HYDREA) 500 MG capsule; TAKE 2 CAPSULES BY MOUTH DAILY. MAY TAKE WITH FOOD TO MINIMIZE GI SIDE EFFECTS.  Dispense: 60 capsule; Refill: 3 - Pain Mgmt, Profile 8 w/Conf, U  2. Chronic pain syndrome - DULoxetine (CYMBALTA) 60 MG capsule; Take 1 capsule (60 mg total) by mouth daily.  Dispense: 30 capsule; Refill: 3 - gabapentin (NEURONTIN) 300 MG capsule; Take 1 capsule (300 mg total) by mouth 3 (three) times daily.  Dispense: 90 capsule; Refill: 2 - Pain Mgmt, Profile 8 w/Conf, U  3. Family planning, Depo-Provera contraception monitoring/administration Patient will need to return on August 30th for depo provera injection.  - POCT urine pregnancy - medroxyPROGESTERone (DEPO-PROVERA) injection 150 mg; Inject 1 mL (150 mg total) into the muscle once.   RTC: 1 month for sickle cell anemia, medication management and pap smear   Massie MaroonHollis,Meghana Tullo M, FNP

## 2015-08-09 ENCOUNTER — Telehealth (HOSPITAL_COMMUNITY): Payer: Self-pay | Admitting: *Deleted

## 2015-08-09 NOTE — Telephone Encounter (Signed)
Told patient after speaking with the provide C.[name], NP, that she needed to stay home, continue taking her pain meds as prescribe and drinking 64oz of water and rest. Pt voiced understanding.

## 2015-08-09 NOTE — Telephone Encounter (Signed)
Pt called requesting to come to the [facility] for treatment. Pt stated her pain is 8/10, in her stomach, back and legs. Pt stated she took her last pain med at 6am the MS Contin and the Percocet and had been taking these meds as prescribed and drinking lots of water. Pt denies chest, abdominal, pain, fever, nausea or vomiting at this time. Will check with the provider and give her a call back. Pt voiced understanding.

## 2015-08-12 ENCOUNTER — Telehealth (HOSPITAL_COMMUNITY): Payer: Self-pay | Admitting: *Deleted

## 2015-08-12 ENCOUNTER — Encounter (HOSPITAL_COMMUNITY): Payer: Self-pay | Admitting: Emergency Medicine

## 2015-08-12 ENCOUNTER — Telehealth: Payer: Self-pay

## 2015-08-12 ENCOUNTER — Emergency Department (HOSPITAL_COMMUNITY): Payer: Medicaid Other

## 2015-08-12 ENCOUNTER — Emergency Department (HOSPITAL_COMMUNITY)
Admission: EM | Admit: 2015-08-12 | Discharge: 2015-08-12 | Disposition: A | Discharge status: Home / Self Care | Payer: Medicaid Other | Attending: Emergency Medicine | Admitting: Emergency Medicine

## 2015-08-12 DIAGNOSIS — M25552 Pain in left hip: Secondary | ICD-10-CM | POA: Diagnosis not present

## 2015-08-12 DIAGNOSIS — R079 Chest pain, unspecified: Secondary | ICD-10-CM | POA: Insufficient documentation

## 2015-08-12 DIAGNOSIS — G8929 Other chronic pain: Secondary | ICD-10-CM | POA: Insufficient documentation

## 2015-08-12 DIAGNOSIS — M25551 Pain in right hip: Secondary | ICD-10-CM | POA: Diagnosis not present

## 2015-08-12 DIAGNOSIS — R51 Headache: Secondary | ICD-10-CM | POA: Insufficient documentation

## 2015-08-12 DIAGNOSIS — M79604 Pain in right leg: Secondary | ICD-10-CM | POA: Insufficient documentation

## 2015-08-12 DIAGNOSIS — R0602 Shortness of breath: Secondary | ICD-10-CM | POA: Diagnosis not present

## 2015-08-12 DIAGNOSIS — D5700 Hb-SS disease with crisis, unspecified: Secondary | ICD-10-CM | POA: Diagnosis present

## 2015-08-12 DIAGNOSIS — R110 Nausea: Secondary | ICD-10-CM | POA: Insufficient documentation

## 2015-08-12 DIAGNOSIS — Z87891 Personal history of nicotine dependence: Secondary | ICD-10-CM | POA: Diagnosis not present

## 2015-08-12 DIAGNOSIS — M79605 Pain in left leg: Secondary | ICD-10-CM | POA: Diagnosis not present

## 2015-08-12 DIAGNOSIS — D571 Sickle-cell disease without crisis: Secondary | ICD-10-CM

## 2015-08-12 LAB — CBC WITH DIFFERENTIAL/PLATELET
BASOS ABS: 0.1 10*3/uL (ref 0.0–0.1)
Basophils Relative: 1 %
EOS ABS: 0.1 10*3/uL (ref 0.0–0.7)
Eosinophils Relative: 1 %
HCT: 25.2 % — ABNORMAL LOW (ref 36.0–46.0)
HEMOGLOBIN: 9 g/dL — AB (ref 12.0–15.0)
LYMPHS PCT: 34 %
Lymphs Abs: 4.7 10*3/uL — ABNORMAL HIGH (ref 0.7–4.0)
MCH: 33.8 pg (ref 26.0–34.0)
MCHC: 35.7 g/dL (ref 30.0–36.0)
MCV: 94.7 fL (ref 78.0–100.0)
MONOS PCT: 9 %
Monocytes Absolute: 1.2 10*3/uL — ABNORMAL HIGH (ref 0.1–1.0)
NEUTROS PCT: 55 %
Neutro Abs: 7.7 10*3/uL (ref 1.7–7.7)
PLATELETS: 297 10*3/uL (ref 150–400)
RBC: 2.66 MIL/uL — AB (ref 3.87–5.11)
RDW: 21.6 % — ABNORMAL HIGH (ref 11.5–15.5)
WBC: 13.8 10*3/uL — AB (ref 4.0–10.5)

## 2015-08-12 LAB — PAIN MGMT, PROFILE 8 W/CONF, U
6 Acetylmorphine: NEGATIVE ng/mL (ref <10)
AMPHETAMINES: NEGATIVE ng/mL (ref <500)
Alcohol Metabolites: NEGATIVE ng/mL (ref <500)
Benzodiazepines: NEGATIVE ng/mL (ref <100)
Buprenorphine: NEGATIVE ng/mL (ref <5)
CODEINE: NEGATIVE ng/mL (ref <50)
Cocaine Metabolite: NEGATIVE ng/mL (ref <150)
Creatinine: 63.2 mg/dL (ref >=20.0)
HYDROCODONE: 213 ng/mL — AB (ref <50)
Hydromorphone: 446 ng/mL — ABNORMAL HIGH (ref <50)
MARIJUANA METABOLITE: NEGATIVE ng/mL (ref <20)
MDMA: NEGATIVE ng/mL (ref <500)
Morphine: 31559 ng/mL — ABNORMAL HIGH (ref <50)
NOROXYCODONE: 1367 ng/mL — AB (ref <50)
Norhydrocodone: 560 ng/mL — ABNORMAL HIGH (ref <50)
OPIATES: POSITIVE ng/mL — AB (ref <100)
OXIDANT: NEGATIVE ug/mL (ref <200)
OXYCODONE: 1067 ng/mL — AB (ref <50)
OXYCODONE: POSITIVE ng/mL — AB (ref <100)
Oxymorphone: 379 ng/mL — ABNORMAL HIGH (ref <50)
PH: 7.62 (ref 4.5–9.0)
Please note:: 0

## 2015-08-12 LAB — RETICULOCYTES
RBC.: 2.68 MIL/uL — ABNORMAL LOW (ref 3.87–5.11)
RETIC CT PCT: 15.4 % — AB (ref 0.4–3.1)
Retic Count, Absolute: 412.7 10*3/uL — ABNORMAL HIGH (ref 19.0–186.0)

## 2015-08-12 LAB — COMPREHENSIVE METABOLIC PANEL
ALT: 33 U/L (ref 14–54)
AST: 45 U/L — ABNORMAL HIGH (ref 15–41)
Albumin: 4.5 g/dL (ref 3.5–5.0)
Alkaline Phosphatase: 100 U/L (ref 38–126)
Anion gap: 5 (ref 5–15)
BUN: 12 mg/dL (ref 6–20)
CO2: 22 mmol/L (ref 22–32)
Calcium: 8.8 mg/dL — ABNORMAL LOW (ref 8.9–10.3)
Chloride: 110 mmol/L (ref 101–111)
Creatinine, Ser: 0.47 mg/dL (ref 0.44–1.00)
GFR calc Af Amer: >60 mL/min (ref >60)
GFR calc non Af Amer: >60 mL/min (ref >60)
Glucose, Bld: 91 mg/dL (ref 65–99)
Potassium: 3.4 mmol/L — ABNORMAL LOW (ref 3.5–5.1)
Sodium: 137 mmol/L (ref 135–145)
Total Bilirubin: 2.7 mg/dL — ABNORMAL HIGH (ref 0.3–1.2)
Total Protein: 7.9 g/dL (ref 6.5–8.1)

## 2015-08-12 LAB — I-STAT TROPONIN, ED: Troponin i, poc: 0 ng/mL (ref 0.00–0.08)

## 2015-08-12 MED ORDER — HYDROMORPHONE HCL 1 MG/ML IJ SOLN
1.0000 mg | Freq: Once | INTRAMUSCULAR | Status: AC
  Administered 2015-08-12: 1 mg via INTRAVENOUS
  Filled 2015-08-12: qty 1

## 2015-08-12 MED ORDER — KETOROLAC TROMETHAMINE 30 MG/ML IJ SOLN
30.0000 mg | Freq: Once | INTRAMUSCULAR | Status: AC
  Administered 2015-08-12: 30 mg via INTRAVENOUS
  Filled 2015-08-12: qty 1

## 2015-08-12 MED ORDER — SODIUM CHLORIDE 0.9 % IV BOLUS (SEPSIS)
1000.0000 mL | Freq: Once | INTRAVENOUS | Status: AC
  Administered 2015-08-12: 1000 mL via INTRAVENOUS

## 2015-08-12 MED ORDER — OXYCODONE-ACETAMINOPHEN 10-325 MG PO TABS: 1.0000 | ORAL_TABLET | ORAL | Status: DC | PRN

## 2015-08-12 NOTE — ED Notes (Signed)
Patient left before receiving discharge instructions

## 2015-08-12 NOTE — Telephone Encounter (Signed)
Refill request for Oxycodone and MS Contin. LOV 08/08/2015. Please advise. Thanks!

## 2015-08-12 NOTE — Discharge Instructions (Signed)
Please follow-up with your primary care physician within the next 3 days.   If your pain worsens or you have trouble breathing, please return to the emergency room or seek medical attention immediately.

## 2015-08-12 NOTE — Telephone Encounter (Signed)
Pt is requesting a medication refill for her Oxycodone and MS Contin. Thanks!

## 2015-08-12 NOTE — ED Notes (Signed)
Patient stated that she wants her port accessed and not a stick to draw blood.

## 2015-08-12 NOTE — Telephone Encounter (Signed)
Reviewed [place] Substance Reporting system prior to prescribing opiate medications, no inconsistencies noted.  ngococcal and Pneumococcal vaccines.   We agreed on Percocet 10-325 mg every 4 hours for moderate to severe pain. Plan also discussed with Dr. Nolene Bernheimlu Jegede. We  plan on titrating her Medication dose. We discussed that pt is to receive her Schedule II prescriptions only from us. Pt is also aware that the prescription history is available to us online through the [facility] CS[facility]. Controlled substance agreement signed 08/08/2015. We reminded [name] that all patients receiving Schedule II narcotics must be seen for follow within one month of prescription being requested. We reviewed the terms of our pain agreement, including the need to keep medicines in a safe locked location away from children or pets, and the need to report excess sedation or constipation, measures to avoid constipation, and policies related to early refills and stolen prescriptions. According to the  Chronic Pain Initiative program, we have reviewed details related to analgesia, adverse effects, aberrant behaviors.   Meds ordered this encounter  Medications  . oxyCODONE-acetaminophen (PERCOCET) 10-325 MG tablet    Sig: Take 1 tablet by mouth every 4 (four) hours as needed for pain.    Dispense:  90 tablet    Refill:  0    Order Specific Question:  Supervising Provider    Answer:  Quentin AngstJEGEDE, OLUGBEMIGA E [[id number]    Massie MaroonHollis,Riel Hirschman M, FNP

## 2015-08-12 NOTE — ED Provider Notes (Signed)
CSN: [id number]     Arrival date & time 08/12/15  1554 History   First MD Initiated Contact with Patient 08/12/15 1856     Chief Complaint  Patient presents with  . Sickle Cell Pain Crisis     (Consider location/radiation/quality/duration/timing/severity/associated sxs/prior Treatment) HPI  Patient is a 30 yo F with a PMHx of sickle cell anemia presenting to the ED complaining of "sickle cell pain." States he started having pain in her chest, hips, back, and legs since yesterday morning. Also reports having a headache and nausea; no vomiting. States her chest pain was sharp yesterday but is squeezing in character since this morning and is now associated with SOB. She describes the chest pain as constant, substernal radiating to the left side of the chest and under her left breast, and 9/10 in intensity. States she has similar pains 3 months ago. Reports taking Hydroxyurea, folic acid, oxycodone, and Ms-contin at home. No other complaints.   Past Medical History  Diagnosis Date  . Sickle cell anemia (HCC)   . Anemia    Past Surgical History  Procedure Laterality Date  . Tubal ligation    . Cholecystectomy    . Cesarean section    . Port a cath placement Right     about 6-7 years ago  . Cholecystectomy  2000  . Multiple tooth extractions N/A    Family History  Problem Relation Age of Onset  . Sickle cell anemia Other   . Sickle cell trait Father   . Sickle cell trait Mother    Social History  Substance Use Topics  . Smoking status: Former Games developer  . Smokeless tobacco: None     Comment: smokes once every couple of weeks.   . Alcohol Use: No   OB History    No data available     Review of Systems  Constitutional: Negative for fever and chills.  HENT: Negative for congestion and sore throat.   Eyes: Negative for pain and visual disturbance.  Respiratory: Positive for shortness of breath. Negative for cough and wheezing.   Cardiovascular: Positive for chest pain. Negative  for palpitations and leg swelling.  Gastrointestinal: Positive for nausea. Negative for vomiting, abdominal pain and diarrhea.  Genitourinary: Negative for dysuria and flank pain.  Musculoskeletal:       Lower back pain Bilateral hip pain Bilateral leg pain   Skin: Negative for pallor and rash.  Neurological: Positive for headaches. Negative for dizziness, weakness and numbness.      Allergies  Ultram; Zofran; Buprenorphine hcl; Morphine and related; and Tape  Home Medications   Prior to Admission medications   Medication Sig Start Date End Date Taking? Authorizing Provider  Cholecalciferol (VITAMIN D3) 5000 UNITS CAPS Take 1 capsule (5,000 Units total) by mouth daily. Patient not taking: Reported on 07/12/2015 01/08/15   Quentin Angst, MD  DULoxetine (CYMBALTA) 60 MG capsule Take 1 capsule (60 mg total) by mouth daily. 08/08/15   [name], FNP  folic acid (FOLVITE) 1 MG tablet Take 1 tablet (1 mg total) by mouth daily. 08/08/15   [name], FNP  gabapentin (NEURONTIN) 300 MG capsule Take 1 capsule (300 mg total) by mouth 3 (three) times daily. 08/08/15   [name], FNP  HYDROmorphone (DILAUDID) 1 MG/ML injection Inject 1 mL (1 mg total) into the vein once. Patient not taking: Reported on 08/08/2015 08/06/15   Quentin Angst, MD  hydroxyurea (HYDREA) 500 MG capsule TAKE 2 CAPSULES BY MOUTH  DAILY. MAY TAKE WITH FOOD TO MINIMIZE GI SIDE EFFECTS. 08/08/15   Massie MaroonLachina M [name], FNP  ibuprofen (ADVIL,MOTRIN) 200 MG tablet Take 200 mg by mouth every 6 (six) hours as needed for moderate pain.    Historical Provider, MD  morphine (MS CONTIN) 30 MG 12 hr tablet Take 1 tablet (30 mg total) by mouth every 12 (twelve) hours. 07/26/15   Quentin Angstlugbemiga E Jegede, MD  oxyCODONE-acetaminophen (PERCOCET) 10-325 MG tablet Take 1 tablet by mouth every 4 (four) hours as needed for pain. 08/12/15   Massie MaroonLachina M [name], FNP  Topiramate ER 100 MG CP24 Take 100 mg by mouth at bedtime.    Historical  Provider, MD   BP 116/73 mmHg  Pulse 101  Temp(Src) 99.2 F (37.3 C) (Oral)  Resp 18  SpO2 97%  LMP 07/31/2015 Physical Exam  Constitutional: She is oriented to person, place, and time. She appears well-developed and well-nourished. No distress.  HENT:  Head: Normocephalic and atraumatic.  Mouth/Throat: Oropharynx is clear and moist.  Eyes: EOM are normal. Pupils are equal, round, and reactive to light.  Neck: Neck supple. No tracheal deviation present.  Cardiovascular: Normal rate, regular rhythm and intact distal pulses.  Exam reveals no gallop and no friction rub.   No murmur heard. Pulmonary/Chest: Effort normal and breath sounds normal. No respiratory distress. She has no wheezes. She has no rales.  Abdominal: Soft. Bowel sounds are normal. She exhibits no distension. There is no tenderness. There is no guarding.  Musculoskeletal: Normal range of motion. She exhibits no edema.  Neurological: She is alert and oriented to person, place, and time. No cranial nerve deficit.  Strength 5/5 and sensation to light touch in bilateral upper and lower extremities.   Skin: Skin is warm and dry. No rash noted. She is not diaphoretic. No erythema.    ED Course  Procedures (including critical care time) Labs Review Labs Reviewed  COMPREHENSIVE METABOLIC PANEL  CBC WITH DIFFERENTIAL/PLATELET  RETICULOCYTES    Imaging Review Dg Chest 2 View  08/12/2015  CLINICAL DATA:  Left-sided chest pain and shortness of breath for 2 days, history of sickle cell EXAM: CHEST  2 VIEW COMPARISON:  07/12/2015 FINDINGS: Cardiac shadow is within normal limits. The lungs are clear bilaterally. The port has been repositioned in satisfactory position at the cavoatrial junction. No bony abnormality is seen. IMPRESSION: No active cardiopulmonary disease. Electronically Signed   By: Alcide CleverMark  Lukens M.D.   On: 08/12/2015 16:37   I have personally reviewed and evaluated these images and lab results as part of my medical  decision-making.   EKG Interpretation None      MDM   Final diagnoses:  None  Chronic sickle cell pain  Patient is presenting with a 2 day history of chest pain, lower back pain, hip pain, and pain in her legs. CXR is not showing any acute abnormality. Istat troponin negative and EKG is not showing any acute ST/ T wave changes. Her reticulocyte count is close to her baseline and platelet count is normal. Patient is afebrile and vitals are stable. Labs, vitals, and imaging are not consistent with sickle cell crisis.  -2L bolus of normal saline -Toradol 30 mg once  -Dilaudid 1 mg given twice  -Patient has been advised to follow-up with her PCP within the next 3 days. Advised to return to the ED or seek medical attention immediately if her pain worsens or she experiences SOB.      John GiovanniVasundhra Khyle Goodell, MD 08/12/15 [phone]  Lyndal Pulley, MD 08/13/15 [phone]

## 2015-08-12 NOTE — ED Notes (Signed)
Patient left before receiving dc instructions, iv access was removed. Charge nurse notified.

## 2015-08-12 NOTE — Telephone Encounter (Signed)
Returned call concerning pain. Telford NabL. [name] NP advises for patient to continue taking pain medication and hydrate really well. Patient advised and voices understanding. Wants to know if prescription will be ready for pick up tomorrow. Advised to call in in am to check before coming to pick up.

## 2015-08-12 NOTE — Telephone Encounter (Signed)
[facility] of pain in back, legs and arms. Rating pain intensity 9/10. Denies chest pain, shortness of breath, fever or abdominal pain. Took oxycodone 10 mg at 7 am and advil at 7 am without relief and wants to come for treatment

## 2015-08-12 NOTE — ED Notes (Signed)
Pt states that she has had sickle cell pain in her legs, hip, back and chest since last night. Took home meds w/o relief. Alert and oriented.

## 2015-08-13 ENCOUNTER — Other Ambulatory Visit: Payer: Self-pay | Admitting: Internal Medicine

## 2015-08-15 ENCOUNTER — Telehealth (HOSPITAL_COMMUNITY): Payer: Self-pay | Admitting: *Deleted

## 2015-08-15 NOTE — Telephone Encounter (Signed)
Called complaining of 8/10 pain in arms and legs with intensity of 8/10 level. Took MS Contin 30 mg and oxycodone 10 mg at 6 am without relief. Denies other symptoms such as chest pain, fever, abdominal pain, diarrhea or N/V. Wants to come in for treatment.

## 2015-08-15 NOTE — Telephone Encounter (Signed)
Called patient back after notifying L. [name] NP about patient's symptoms. Advised to drink 64 ounces of water every 1 hour, take Percocet as prescribed, take Cymbalta and hydroxyurea as prescribed. Patient stated..."I am doing that". Dial tone audible.

## 2015-08-16 ENCOUNTER — Telehealth (HOSPITAL_COMMUNITY): Payer: Self-pay | Admitting: *Deleted

## 2015-08-16 NOTE — Telephone Encounter (Signed)
[facility]. Pt states her pain is 9/10 and it is in her arms, legs and back. Pt stated she took her last pain med at 6 am this morning. Will check with the provider and give her a call back. Pt voiced understanding of this.

## 2015-08-16 NOTE — Telephone Encounter (Signed)
After checking with the provider C. [name], NP, called patient back and told her she needed to stay home and continue to take her pain meds. She needs to drink 64 ounces of water also and that she should take her Cymbalta and MS contin. Pt stated that she had done that and hung up phone.

## 2015-08-19 ENCOUNTER — Telehealth (HOSPITAL_COMMUNITY): Payer: Self-pay | Admitting: *Deleted

## 2015-08-19 NOTE — Telephone Encounter (Signed)
[facility]. Pt states her pain is 9/10 and in her legs, arms and back. Pt states she took her last pain med at 6am this morning. She also states that she is drinking all the fluids (water) that is recommended. And has taken her prescribed medication. Will check with the provider and give her a call back. Pt voiced understanding.

## 2015-08-19 NOTE — Telephone Encounter (Signed)
After speaking with the provider C.[name], NP, pt was advised that she needed to stay home and continue taking her medications as prescribed. Pt was asked also if the [facility] had not been in touch with her just to check on her. Pt stated no. She states she has the pain all the time. Advised patient that she can not get IV Dilaudid everyday and she states she understands but the pain is sill there. Patient requested that the NP give her a call. RN stated that she will pass this on to the NP.

## 2015-08-20 ENCOUNTER — Telehealth (HOSPITAL_COMMUNITY): Payer: Self-pay | Admitting: *Deleted

## 2015-08-20 ENCOUNTER — Telehealth: Payer: Self-pay | Admitting: Hematology

## 2015-08-20 NOTE — Telephone Encounter (Signed)
Dr. Hyman HopesJegede notified about patient's complaints of pain this morning. Advised to continue current treatment and keep follow up appointments as scheduled. Patient notified and states..."I know I am in a crisis".

## 2015-08-20 NOTE — Telephone Encounter (Signed)
Called complaining about pain in arms, legs and back. Rating pain intensity 9/10. Took oxycodone 10 mg at 7 am and MS Contin at 5 am without relief. Denies chest pain, fever, abdominal pain, and N/V. Wants to come in for treatment.

## 2015-08-20 NOTE — Telephone Encounter (Signed)
[facility] to speak to Dr. Hyman HopesJegede "right now".  Explained to patient that we can take a message and pass it on. She wants him to call her. Message relayed to Dr. Hyman HopesJegede.

## 2015-08-20 NOTE — Telephone Encounter (Signed)
Called and spoke with monica at the sickle cell agency in regards to patient conference that was held on June 8th.  I explained that patient has called to come to [facility] hospital every day since the meeting.  Asked monica if she could follow up with Latangela to offer resources,activities, etc. For the patient.  Maxine GlennMonica will follow up and get back to me.

## 2015-08-21 ENCOUNTER — Emergency Department (HOSPITAL_COMMUNITY): Payer: Medicaid Other

## 2015-08-21 ENCOUNTER — Observation Stay (HOSPITAL_COMMUNITY)
Admission: EM | Admit: 2015-08-21 | Discharge: 2015-08-23 | Disposition: A | Discharge status: Home / Self Care | Payer: Medicaid Other | Attending: Internal Medicine | Admitting: Internal Medicine

## 2015-08-21 ENCOUNTER — Other Ambulatory Visit: Payer: Self-pay

## 2015-08-21 ENCOUNTER — Encounter (HOSPITAL_COMMUNITY): Payer: Self-pay | Admitting: Emergency Medicine

## 2015-08-21 DIAGNOSIS — Z87891 Personal history of nicotine dependence: Secondary | ICD-10-CM | POA: Diagnosis not present

## 2015-08-21 DIAGNOSIS — G894 Chronic pain syndrome: Secondary | ICD-10-CM | POA: Diagnosis present

## 2015-08-21 DIAGNOSIS — F339 Major depressive disorder, recurrent, unspecified: Secondary | ICD-10-CM | POA: Insufficient documentation

## 2015-08-21 DIAGNOSIS — D5700 Hb-SS disease with crisis, unspecified: Secondary | ICD-10-CM | POA: Diagnosis not present

## 2015-08-21 DIAGNOSIS — Z79899 Other long term (current) drug therapy: Secondary | ICD-10-CM | POA: Diagnosis not present

## 2015-08-21 DIAGNOSIS — Z791 Long term (current) use of non-steroidal anti-inflammatories (NSAID): Secondary | ICD-10-CM | POA: Diagnosis not present

## 2015-08-21 DIAGNOSIS — F32A Depression, unspecified: Secondary | ICD-10-CM | POA: Diagnosis present

## 2015-08-21 DIAGNOSIS — D638 Anemia in other chronic diseases classified elsewhere: Secondary | ICD-10-CM | POA: Diagnosis present

## 2015-08-21 DIAGNOSIS — F329 Major depressive disorder, single episode, unspecified: Secondary | ICD-10-CM | POA: Diagnosis present

## 2015-08-21 DIAGNOSIS — E876 Hypokalemia: Secondary | ICD-10-CM | POA: Diagnosis present

## 2015-08-21 HISTORY — DX: Major depressive disorder, recurrent, unspecified: F33.9

## 2015-08-21 HISTORY — DX: Migraine, unspecified, not intractable, without status migrainosus: G43.909

## 2015-08-21 LAB — COMPREHENSIVE METABOLIC PANEL
ALT: 29 U/L (ref 14–54)
AST: 36 U/L (ref 15–41)
Albumin: 4.8 g/dL (ref 3.5–5.0)
Alkaline Phosphatase: 90 U/L (ref 38–126)
Anion gap: 7 (ref 5–15)
BUN: 8 mg/dL (ref 6–20)
CO2: 18 mmol/L — ABNORMAL LOW (ref 22–32)
Calcium: 9.4 mg/dL (ref 8.9–10.3)
Chloride: 114 mmol/L — ABNORMAL HIGH (ref 101–111)
Creatinine, Ser: 0.32 mg/dL — ABNORMAL LOW (ref 0.44–1.00)
GFR calc Af Amer: >60 mL/min (ref >60)
GFR calc non Af Amer: >60 mL/min (ref >60)
Glucose, Bld: 109 mg/dL — ABNORMAL HIGH (ref 65–99)
Potassium: 3 mmol/L — ABNORMAL LOW (ref 3.5–5.1)
Sodium: 139 mmol/L (ref 135–145)
Total Bilirubin: 2.7 mg/dL — ABNORMAL HIGH (ref 0.3–1.2)
Total Protein: 8.1 g/dL (ref 6.5–8.1)

## 2015-08-21 LAB — CBC WITH DIFFERENTIAL/PLATELET
Basophils Absolute: 0 10*3/uL (ref 0.0–0.1)
Basophils Relative: 0 %
Eosinophils Absolute: 0 10*3/uL (ref 0.0–0.7)
Eosinophils Relative: 0 %
HCT: 26.4 % — ABNORMAL LOW (ref 36.0–46.0)
Hemoglobin: 9.4 g/dL — ABNORMAL LOW (ref 12.0–15.0)
Lymphocytes Relative: 17 %
Lymphs Abs: 2.4 10*3/uL (ref 0.7–4.0)
MCH: 33.1 pg (ref 26.0–34.0)
MCHC: 35.6 g/dL (ref 30.0–36.0)
MCV: 93 fL (ref 78.0–100.0)
Monocytes Absolute: 1 10*3/uL (ref 0.1–1.0)
Monocytes Relative: 7 %
Neutro Abs: 11.2 10*3/uL — ABNORMAL HIGH (ref 1.7–7.7)
Neutrophils Relative %: 76 %
Platelets: 429 10*3/uL — ABNORMAL HIGH (ref 150–400)
RBC: 2.84 MIL/uL — ABNORMAL LOW (ref 3.87–5.11)
RDW: 20.3 % — ABNORMAL HIGH (ref 11.5–15.5)
WBC: 14.7 10*3/uL — ABNORMAL HIGH (ref 4.0–10.5)

## 2015-08-21 LAB — RETICULOCYTES
RBC.: 2.84 MIL/uL — ABNORMAL LOW (ref 3.87–5.11)
Retic Count, Absolute: 281.2 10*3/uL — ABNORMAL HIGH (ref 19.0–186.0)
Retic Ct Pct: 9.9 % — ABNORMAL HIGH (ref 0.4–3.1)

## 2015-08-21 LAB — MAGNESIUM: Magnesium: 2.1 mg/dL (ref 1.7–2.4)

## 2015-08-21 MED ORDER — PROMETHAZINE HCL 25 MG PO TABS: 12.5000 mg | ORAL_TABLET | ORAL | Status: DC | PRN

## 2015-08-21 MED ORDER — DULOXETINE HCL 60 MG PO CPEP
60.0000 mg | ORAL_CAPSULE | Freq: Every day | ORAL | Status: DC
  Administered 2015-08-21 – 2015-08-23 (×3): 60 mg via ORAL
  Filled 2015-08-21 (×3): qty 1

## 2015-08-21 MED ORDER — HYDROXYUREA 500 MG PO CAPS
1000.0000 mg | ORAL_CAPSULE | Freq: Every day | ORAL | Status: DC
  Administered 2015-08-22 – 2015-08-23 (×2): 1000 mg via ORAL
  Filled 2015-08-21 (×2): qty 2

## 2015-08-21 MED ORDER — DEXTROSE-NACL 5-0.45 % IV SOLN
INTRAVENOUS | Status: DC
  Administered 2015-08-21: 14:00:00 via INTRAVENOUS

## 2015-08-21 MED ORDER — POTASSIUM CHLORIDE CRYS ER 20 MEQ PO TBCR
40.0000 meq | EXTENDED_RELEASE_TABLET | Freq: Once | ORAL | Status: AC
  Administered 2015-08-21: 40 meq via ORAL
  Filled 2015-08-21: qty 2

## 2015-08-21 MED ORDER — HYDROMORPHONE HCL 1 MG/ML IJ SOLN
1.0000 mg | INTRAMUSCULAR | Status: DC | PRN
  Administered 2015-08-21 – 2015-08-22 (×7): 1 mg via INTRAVENOUS
  Filled 2015-08-21 (×8): qty 1

## 2015-08-21 MED ORDER — HYDROMORPHONE HCL 2 MG/ML IJ SOLN
2.0000 mg | Freq: Once | INTRAMUSCULAR | Status: AC
Start: 2015-08-21 — End: 2015-08-21
  Administered 2015-08-21: 2 mg via INTRAVENOUS
  Filled 2015-08-21: qty 1

## 2015-08-21 MED ORDER — SODIUM CHLORIDE 0.9 % IV SOLN
25.0000 mg | Freq: Four times a day (QID) | INTRAVENOUS | Status: DC | PRN
  Administered 2015-08-22 (×2): 25 mg via INTRAVENOUS
  Filled 2015-08-21 (×4): qty 0.5

## 2015-08-21 MED ORDER — DEXTROSE-NACL 5-0.45 % IV SOLN
INTRAVENOUS | Status: DC
  Administered 2015-08-21 – 2015-08-22 (×2): via INTRAVENOUS
  Administered 2015-08-23: 1000 mL via INTRAVENOUS

## 2015-08-21 MED ORDER — KETOROLAC TROMETHAMINE 30 MG/ML IJ SOLN
30.0000 mg | Freq: Four times a day (QID) | INTRAMUSCULAR | Status: DC
  Administered 2015-08-22 – 2015-08-23 (×4): 30 mg via INTRAVENOUS
  Filled 2015-08-21 (×5): qty 1

## 2015-08-21 MED ORDER — HYDROMORPHONE HCL 2 MG/ML IJ SOLN
2.0000 mg | INTRAMUSCULAR | Status: AC | PRN
  Administered 2015-08-21 (×3): 2 mg via INTRAVENOUS
  Filled 2015-08-21 (×3): qty 1

## 2015-08-21 MED ORDER — KETOROLAC TROMETHAMINE 15 MG/ML IJ SOLN
15.0000 mg | Freq: Once | INTRAMUSCULAR | Status: AC
  Administered 2015-08-21: 15 mg via INTRAVENOUS
  Filled 2015-08-21: qty 1

## 2015-08-21 MED ORDER — ACETAMINOPHEN 325 MG PO TABS: 650.0000 mg | ORAL_TABLET | Freq: Four times a day (QID) | ORAL | Status: DC | PRN

## 2015-08-21 MED ORDER — POLYETHYLENE GLYCOL 3350 17 G PO PACK: 17.0000 g | PACK | Freq: Every day | ORAL | Status: DC | PRN

## 2015-08-21 MED ORDER — PROMETHAZINE HCL 25 MG RE SUPP: 12.5000 mg | RECTAL | Status: DC | PRN

## 2015-08-21 MED ORDER — SENNOSIDES-DOCUSATE SODIUM 8.6-50 MG PO TABS
1.0000 | ORAL_TABLET | Freq: Two times a day (BID) | ORAL | Status: DC
  Filled 2015-08-21 (×4): qty 1

## 2015-08-21 MED ORDER — ENOXAPARIN SODIUM 40 MG/0.4ML ~~LOC~~ SOLN
40.0000 mg | SUBCUTANEOUS | Status: DC
  Filled 2015-08-21 (×2): qty 0.4

## 2015-08-21 MED ORDER — FOLIC ACID 1 MG PO TABS
1.0000 mg | ORAL_TABLET | Freq: Every day | ORAL | Status: DC
  Administered 2015-08-21 – 2015-08-23 (×3): 1 mg via ORAL
  Filled 2015-08-21 (×3): qty 1

## 2015-08-21 MED ORDER — DIPHENHYDRAMINE HCL 50 MG/ML IJ SOLN
25.0000 mg | INTRAMUSCULAR | Status: AC | PRN
  Administered 2015-08-21 (×2): 25 mg via INTRAVENOUS
  Filled 2015-08-21 (×2): qty 1

## 2015-08-21 MED ORDER — TOPIRAMATE 25 MG PO TABS
50.0000 mg | ORAL_TABLET | Freq: Two times a day (BID) | ORAL | Status: DC
  Administered 2015-08-21 – 2015-08-23 (×4): 50 mg via ORAL
  Filled 2015-08-21 (×5): qty 2

## 2015-08-21 MED ORDER — OXYCODONE HCL 5 MG PO TABS
10.0000 mg | ORAL_TABLET | ORAL | Status: DC | PRN
  Administered 2015-08-22: 10 mg via ORAL
  Filled 2015-08-21: qty 2

## 2015-08-21 MED ORDER — PROCHLORPERAZINE EDISYLATE 5 MG/ML IJ SOLN
10.0000 mg | Freq: Once | INTRAMUSCULAR | Status: AC
  Administered 2015-08-21: 10 mg via INTRAVENOUS
  Filled 2015-08-21: qty 2

## 2015-08-21 MED ORDER — TOPIRAMATE ER 100 MG PO CAP24: 100.0000 mg | ORAL_CAPSULE | Freq: Every day | ORAL | Status: DC

## 2015-08-21 NOTE — H&P (Signed)
History and Physical    Brittany Foster ZOX:[id number] DOB: 04/15/1985 DOA: 08/21/2015  PCP: Jeanann Lewandowsky, MD Patient coming from:home  Chief Complaint: sickle cell pain crisis  HPI: Brittany Foster is a 30 y.o. female with medical history significant of sickle cell anemia (SS disease) presenting with pain crisis.  Patient reports that for the last 2 days she has been having very bad pain in legs, hips, back, and chest.  Has been drinking more and taking pain medication without relief.  Today the pain was too much to bear.  This all feels like her usual exacerbation other than worse chest tightness than usual.  Her usual regimen is Oxy IR 10mg  or MS Contin but she is being taken off MS Contin due to a morphine allergy and she is not really taking it now but has taken it sporadically over the last 3 days.   She is followed by the [facility] at [facility].  Low-grade fever to 100.  No chills.  +SOB, no cough.  +nausea.  No rashes.  Does not attend school or work.  Does report a lot of walking and spending time with her daughter since school is out.    ED Course: unremarkable evaluation including negative CXR, treated symptomatically with opiates, NSAIDs, and IVF  Review of Systems: As per HPI otherwise 10 point review of systems negative.   Ambulatory Status: ambulatory  Past Medical History  Diagnosis Date  . Sickle cell anemia (HCC)   . Anemia   . Migraines   . Depression, major, recurrent [facility])     Past Surgical History  Procedure Laterality Date  . Tubal ligation    . Cesarean section    . Port a cath placement Right     about 6-7 years ago  . Cholecystectomy  2000  . Multiple tooth extractions N/A     Social History   Social History  . Marital Status: Single    Spouse Name: N/A  . Number of Children: N/A  . Years of Education: N/A   Occupational History  . Not on file.   Social History Main Topics  . Smoking status: Current Some Day Smoker  . Smokeless  tobacco: Never Used     Comment: 08/21/15 - only smokes occasionally  . Alcohol Use: No  . Drug Use: No  . Sexual Activity:    Partners: Male    Birth Control/ Protection: Surgical     Comment: 1 new partner recently, no concern about STI, uses condoms   Other Topics Concern  . Not on file   Social History Narrative    Allergies  Allergen Reactions  . Ultram [Tramadol] Other (See Comments)    seizures  . Zofran [Ondansetron Hcl] Nausea And Vomiting  . Buprenorphine Hcl Hives and Rash    Shaking Tolerates Percocet, Norco, and buprenorphine  . Morphine And Related Hives, Rash and Other (See Comments)    Shaking Tolerates Percocet, Norco, Dilaudid, and buprenorphine  . Tape Rash    Family History  Problem Relation Age of Onset  . Sickle cell anemia Other   . Sickle cell trait Father   . Sickle cell trait Mother     Prior to Admission medications   Medication Sig Start Date End Date Taking? Authorizing Provider  acetaminophen (TYLENOL) 325 MG tablet Take 650 mg by mouth every 6 (six) hours as needed for moderate pain or headache.   Yes Historical Provider, MD  DULoxetine (CYMBALTA) 60 MG capsule Take 1 capsule (  60 mg total) by mouth daily. 08/08/15  Yes [name], FNP  folic acid (FOLVITE) 1 MG tablet Take 1 tablet (1 mg total) by mouth daily. 08/08/15  Yes [name], FNP  hydroxyurea (HYDREA) 500 MG capsule TAKE 2 CAPSULES BY MOUTH DAILY. MAY TAKE WITH FOOD TO MINIMIZE GI SIDE EFFECTS. 08/08/15  Yes [name], FNP  ibuprofen (ADVIL,MOTRIN) 200 MG tablet Take 400 mg by mouth every 6 (six) hours as needed for moderate pain.    Yes Historical Provider, MD  morphine (MS CONTIN) 30 MG 12 hr tablet Take 1 tablet (30 mg total) by mouth every 12 (twelve) hours. 07/26/15  Yes Quentin Angst, MD  oxyCODONE-acetaminophen (PERCOCET) 10-325 MG tablet Take 1 tablet by mouth every 4 (four) hours as needed for pain. 08/12/15  Yes [name], FNP  Topiramate ER 100 MG  CP24 Take 100 mg by mouth at bedtime.   Yes Historical Provider, MD  Cholecalciferol (VITAMIN D3) 5000 UNITS CAPS Take 1 capsule (5,000 Units total) by mouth daily. Patient not taking: Reported on 07/12/2015 01/08/15   Quentin Angst, MD  gabapentin (NEURONTIN) 300 MG capsule Take 1 capsule (300 mg total) by mouth 3 (three) times daily. Patient not taking: Reported on 08/12/2015 08/08/15   [name], FNP  HYDROmorphone (DILAUDID) 1 MG/ML injection Inject 1 mL (1 mg total) into the vein once. Patient not taking: Reported on 08/08/2015 08/06/15   Quentin Angst, MD    Physical Exam: Filed Vitals:   08/21/15 1300 08/21/15 1543 08/21/15 1849  BP: 124/85 100/69 112/76  Pulse: 114 89 88  Temp: 99.4 F (37.4 C)    TempSrc: Oral    Resp: SpO2: 100% 97% 99%     General:  Appears calm and comfortable; eating and in NAD upon my entrance or throughout my evaluation Eyes: PERRL, EOMI, normal lids, iris ENT: grossly normal hearing, lips & tongue, mmm Neck: no LAD, masses or thyromegaly Cardiovascular: RRR, no m/r/g. No LE edema.  Respiratory:  CTA bilaterally, no w/r/r. Normal respiratory effort. Abdomen:  soft, ntnd, NABS Skin:  no rash or induration seen on limited exam Musculoskeletal:  grossly normal tone BUE/BLE, good ROM, no bony abnormality Psychiatric:  grossly normal mood, mildly blunted affect, speech fluent and appropriate, AOx3 Neurologic:  CN 2-12 grossly intact, moves all extremities in coordinated fashion, sensation intact  Labs on Admission: I have personally reviewed following labs and imaging studies  CBC:  Recent Labs Lab 08/21/15 1350  WBC 14.7*  NEUTROABS 11.2*  HGB 9.4*  HCT 26.4*  MCV 93.0  PLT 429*   Basic Metabolic Panel:  Recent Labs Lab 08/21/15 1350  NA 139  K 3.0*  CL 114*  CO2 18*  GLUCOSE 109*  BUN 8  CREATININE 0.32*  CALCIUM 9.4   GFR: Estimated Creatinine Clearance: 80.1 mL/min (by C-G formula based on Cr of  0.32). Liver Function Tests:  Recent Labs Lab 08/21/15 1350  AST 36  ALT 29  ALKPHOS 90  BILITOT 2.7*  PROT 8.1  ALBUMIN 4.8   No results for input(s): LIPASE, AMYLASE in the last 168 hours. No results for input(s): AMMONIA in the last 168 hours. Coagulation Profile: No results for input(s): INR, PROTIME in the last 168 hours. Cardiac Enzymes: No results for input(s): CKTOTAL, CKMB, CKMBINDEX, TROPONINI in the last 168 hours. BNP (last 3 results) No results for input(s): PROBNP in the last 8760 hours. HbA1C: No results for  input(s): HGBA1C in the last 72 hours. CBG: No results for input(s): GLUCAP in the last 168 hours. Lipid Profile: No results for input(s): CHOL, HDL, LDLCALC, TRIG, CHOLHDL, LDLDIRECT in the last 72 hours. Thyroid Function Tests: No results for input(s): TSH, T4TOTAL, FREET4, T3FREE, THYROIDAB in the last 72 hours. Anemia Panel:  Recent Labs  08/21/15 1350  RETICCTPCT 9.9*   Urine analysis:    Component Value Date/Time   COLORURINE YELLOW 08/06/2015 1405   APPEARANCEUR CLEAR 08/06/2015 1405   LABSPEC 1.011 08/06/2015 1405   PHURINE 7.0 08/06/2015 1405   GLUCOSEU NEGATIVE 08/06/2015 1405   HGBUR NEGATIVE 08/06/2015 1405   BILIRUBINUR NEGATIVE 08/06/2015 1405   KETONESUR NEGATIVE 08/06/2015 1405   PROTEINUR NEGATIVE 08/06/2015 1405   UROBILINOGEN 1.0 11/13/2014 1338   NITRITE NEGATIVE 08/06/2015 1405   LEUKOCYTESUR NEGATIVE 08/06/2015 1405    Creatinine Clearance: Estimated Creatinine Clearance: 80.1 mL/min (by C-G formula based on Cr of 0.32).  Sepsis Labs: @LABRCNTIP (procalcitonin:4,lacticidven:4) )No results found for this or any previous visit (from the past 240 hour(s)).   Radiological Exams on Admission: Dg Chest 2 View  08/21/2015  CLINICAL DATA:  Sickle cell pain in the chest, hips and legs today. Shortness of breath with dizziness. EXAM: CHEST  2 VIEW COMPARISON:  08/12/2015 radiographs. FINDINGS: The heart size and mediastinal  contours are stable. The right subclavian Port-A-Cath appears unchanged at the SVC right atrial level. There is a probable catheter remnant in the left brachiocephalic vein which is also stable. The lungs are clear. There is no pleural effusion or pneumothorax. No acute osseous findings are seen. IMPRESSION: Stable chest.  No acute cardiopulmonary process. Electronically Signed   By: Carey BullocksWilliam  Veazey M.D.   On: 08/21/2015 14:19    EKG: Independently reviewed. NSR, normal EKG.  Assessment/Plan Principal Problem:   Vasoocclusive sickle cell crisis (HCC) Active Problems:   Anemia   Depression   Hypokalemia   Chronic pain syndrome   Sickle cell anemia with pain (HCC)   1.  Sickle cell anemia with pain/crisis - Patient with grossly normal exam and essentially normal labs.  Also normal CXR.  Reports pain, but this does appear to be a very mild pain crisis vs. Poorly controlled chronic pain.  Suspect that etiology is multifactorial, including recent changes in pain medication regimen, patient non-compliance (previous report of inconsistent use of Hydroxyurea), and possibly suboptimally controlled depression/social issues (daughter is currently out of school and this appears to be somewhat stressful for the patient).  Will admit to observation status based on mild crisis at time of admission.  Will treat with IVF and pain medications (Toradol, PO Oxycodone IR, and IV Dilaudid).  Based on mild crisis, PCA does not seem indicated at this time - but would consider adding if pain remains uncontrolled with current regimen.  On an ongoing basis, would consider initiation of PO Oxycontin for outpatient use since patient appears to tolerate short-acting oxycodone and does not appear to have tried long-acting based on my cursory review.  2. Anemia - Stable.  Would not consider transfusion at this time.  3. Depression - As above.  Would consider BH/SW consult for further evaluation either inpatient or  outpatient.  4. Hypokalemia - Repleted in ER.  Will follow.  Will check Mag level.   DVT prophylaxis: Lovenox Code Status: Full  Family Communication: None  Disposition Plan: Anticipate discharge to home in <2MN unless pain is inadequately controlled  Consults called: None - consider Sickle Cell Team consult in AM  Admission status: Observation    Jonah Blue MD [facility]  If 7PM-7AM, please contact night-coverage [email or web address] Password [facility]  08/21/2015, 7:49 PM

## 2015-08-21 NOTE — Discharge Instructions (Signed)
Sickle Cell Anemia, Adult Sickle cell anemia is a condition in which red blood cells have an abnormal "sickle" shape. This abnormal shape shortens the cells' life span, which results in a lower than normal concentration of red blood cells in the blood. The sickle shape also causes the cells to clump together and block free blood flow through the blood vessels. As a result, the tissues and organs of the body do not receive enough oxygen. Sickle cell anemia causes organ damage and pain and increases the risk of infection. CAUSES  Sickle cell anemia is a genetic disorder. Those who receive two copies of the gene have the condition, and those who receive one copy have the trait. RISK FACTORS The sickle cell gene is most common in people whose families originated in [place]. Other areas of the globe where sickle cell trait occurs include the Mediterranean, [place] and [place], the [place], and the [place].  SIGNS AND SYMPTOMS  Pain, especially in the extremities, back, chest, or abdomen (common). The pain may start suddenly or may develop following an illness, especially if there is dehydration. Pain can also occur due to overexertion or exposure to extreme temperature changes.  Frequent severe bacterial infections, especially certain types of pneumonia and meningitis.  Pain and swelling in the hands and feet.  Decreased activity.   Loss of appetite.   Change in behavior.  Headaches.  Seizures.  Shortness of breath or difficulty breathing.  Vision changes.  Skin ulcers. Those with the trait may not have symptoms or they may have mild symptoms.  DIAGNOSIS  Sickle cell anemia is diagnosed with blood tests that demonstrate the genetic trait. It is often diagnosed during the newborn period, due to mandatory testing nationwide. A variety of blood tests, X-rays, CT scans, MRI scans, ultrasounds, and lung function tests may also be done to monitor the condition. TREATMENT  Sickle  cell anemia may be treated with:  Medicines. You may be given pain medicines, antibiotic medicines (to treat and prevent infections) or medicines to increase the production of certain types of hemoglobin.  Fluids.  Oxygen.  Blood transfusions. HOME CARE INSTRUCTIONS   Drink enough fluid to keep your urine clear or pale yellow. Increase your fluid intake in hot weather and during exercise.  Do not smoke. Smoking lowers oxygen levels in the blood.   Only take over-the-counter or prescription medicines for pain, fever, or discomfort as directed by your health care provider.  Take antibiotics as directed by your health care provider. Make sure you finish them it even if you start to feel better.   Take supplements as directed by your health care provider.   Consider wearing a medical alert bracelet. This tells anyone caring for you in an emergency of your condition.   When traveling, keep your medical information, health care provider's names, and the medicines you take with you at all times.   If you develop a fever, do not take medicines to reduce the fever right away. This could cover up a problem that is developing. Notify your health care provider.  Keep all follow-up appointments with your health care provider. Sickle cell anemia requires regular medical care. SEEK MEDICAL CARE IF: You have a fever. SEEK IMMEDIATE MEDICAL CARE IF:   You feel dizzy or faint.   You have new abdominal pain, especially on the left side near the stomach area.   You develop a persistent, often uncomfortable and painful penile erection (priapism). If this is not treated immediately it   will lead to impotence.   You have numbness your arms or legs or you have a hard time moving them.   You have a hard time with speech.   You have a fever or persistent symptoms for more than 2-3 days.   You have a fever and your symptoms suddenly get worse.   You have signs or symptoms of infection.  These include:   Chills.   Abnormal tiredness (lethargy).   Irritability.   Poor eating.   Vomiting.   You develop pain that is not helped with medicine.   You develop shortness of breath.  You have pain in your chest.   You are coughing up pus-like or bloody sputum.   You develop a stiff neck.  Your feet or hands swell or have pain.  Your abdomen appears bloated.  You develop joint pain. MAKE SURE YOU:  Understand these instructions.   This information is not intended to replace advice given to you by your health care provider. Make sure you discuss any questions you have with your health care provider.   Document Released: 05/27/2005 Document Revised: 03/09/2014 Document Reviewed: 09/28/2012 Elsevier Interactive Patient Education 2016 [employer]  

## 2015-08-21 NOTE — ED Notes (Signed)
Pt reports sickle cell pain in chest, hips and legs. Accompanied by SOB and dizziness.

## 2015-08-21 NOTE — ED Provider Notes (Signed)
CSN: [id number]     Arrival date & time 08/21/15  1256 History   First MD Initiated Contact with Patient 08/21/15 1333     Chief Complaint  Patient presents with  . Sickle Cell Pain Crisis     (Consider location/radiation/quality/duration/timing/severity/associated sxs/prior Treatment) HPI   30 year old female with what she is calling "sickle cell pain." Worsening over the past day. She is having pain in her chest, abdomen, hips, back and legs. Character is not significantly different that past vasocclusive crises. It is not been significantly improved with her home medications. No fever. No cough. No dizziness or lightheadedness. Mild nausea. No vomiting.    Past Medical History  Diagnosis Date  . Sickle cell anemia (HCC)   . Anemia    Past Surgical History  Procedure Laterality Date  . Tubal ligation    . Cholecystectomy    . Cesarean section    . Port a cath placement Right     about 6-7 years ago  . Cholecystectomy  2000  . Multiple tooth extractions N/A    Family History  Problem Relation Age of Onset  . Sickle cell anemia Other   . Sickle cell trait Father   . Sickle cell trait Mother    Social History  Substance Use Topics  . Smoking status: Former Games developer  . Smokeless tobacco: None     Comment: smokes once every couple of weeks.   . Alcohol Use: No   OB History    No data available     Review of Systems  All systems reviewed and negative, other than as noted in HPI.   Allergies  Ultram; Zofran; Buprenorphine hcl; Morphine and related; and Tape  Home Medications   Prior to Admission medications   Medication Sig Start Date End Date Taking? Authorizing Provider  acetaminophen (TYLENOL) 325 MG tablet Take 650 mg by mouth every 6 (six) hours as needed for moderate pain or headache.   Yes Historical Provider, MD  DULoxetine (CYMBALTA) 60 MG capsule Take 1 capsule (60 mg total) by mouth daily. 08/08/15  Yes [name], FNP  folic acid (FOLVITE) 1 MG  tablet Take 1 tablet (1 mg total) by mouth daily. 08/08/15  Yes [name], FNP  hydroxyurea (HYDREA) 500 MG capsule TAKE 2 CAPSULES BY MOUTH DAILY. MAY TAKE WITH FOOD TO MINIMIZE GI SIDE EFFECTS. 08/08/15  Yes [name], FNP  ibuprofen (ADVIL,MOTRIN) 200 MG tablet Take 400 mg by mouth every 6 (six) hours as needed for moderate pain.    Yes Historical Provider, MD  morphine (MS CONTIN) 30 MG 12 hr tablet Take 1 tablet (30 mg total) by mouth every 12 (twelve) hours. 07/26/15  Yes Quentin Angst, MD  oxyCODONE-acetaminophen (PERCOCET) 10-325 MG tablet Take 1 tablet by mouth every 4 (four) hours as needed for pain. 08/12/15  Yes [name], FNP  Topiramate ER 100 MG CP24 Take 100 mg by mouth at bedtime.   Yes Historical Provider, MD  Cholecalciferol (VITAMIN D3) 5000 UNITS CAPS Take 1 capsule (5,000 Units total) by mouth daily. Patient not taking: Reported on 07/12/2015 01/08/15   Quentin Angst, MD  gabapentin (NEURONTIN) 300 MG capsule Take 1 capsule (300 mg total) by mouth 3 (three) times daily. Patient not taking: Reported on 08/12/2015 08/08/15   [name], FNP  HYDROmorphone (DILAUDID) 1 MG/ML injection Inject 1 mL (1 mg total) into the vein once. Patient not taking: Reported on 08/08/2015 08/06/15   Olugbemiga E  Jegede, MD   BP 100/69 mmHg  Pulse 89  Temp(Src) 99.4 F (37.4 C) (Oral)  Resp 18  SpO2 97%  LMP 08/01/2015 Physical Exam  Constitutional: She appears well-developed and well-nourished. No distress.  Sitting in bed. No acute distress.  HENT:  Head: Normocephalic and atraumatic.  Eyes: Conjunctivae are normal. Right eye exhibits no discharge. Left eye exhibits no discharge.  Neck: Neck supple.  Cardiovascular: Normal rate, regular rhythm and normal heart sounds.  Exam reveals no gallop and no friction rub.   No murmur heard. Port right chest.  Pulmonary/Chest: Effort normal and breath sounds normal. No respiratory distress.  Abdominal: Soft. She exhibits  no distension. There is no tenderness.  Musculoskeletal: She exhibits no edema or tenderness.  Lower extremities symmetric as compared to each other. No calf tenderness. Negative Homan's. No palpable cords.   Neurological: She is alert.  Skin: Skin is warm and dry.  Psychiatric: She has a normal mood and affect. Her behavior is normal. Thought content normal.  Nursing note and vitals reviewed.   ED Course  Procedures (including critical care time) Labs Review Labs Reviewed  CBC WITH DIFFERENTIAL/PLATELET - Abnormal; Notable for the following:    WBC 14.7 (*)    RBC 2.84 (*)    Hemoglobin 9.4 (*)    HCT 26.4 (*)    RDW 20.3 (*)    Platelets 429 (*)    Neutro Abs 11.2 (*)    All other components within normal limits  COMPREHENSIVE METABOLIC PANEL - Abnormal; Notable for the following:    Potassium 3.0 (*)    Chloride 114 (*)    CO2 18 (*)    Glucose, Bld 109 (*)    Creatinine, Ser 0.32 (*)    Total Bilirubin 2.7 (*)    All other components within normal limits  RETICULOCYTES - Abnormal; Notable for the following:    Retic Ct Pct 9.9 (*)    RBC. 2.84 (*)    Retic Count, Manual 281.2 (*)    All other components within normal limits    Imaging Review Dg Chest 2 View  08/21/2015  CLINICAL DATA:  Sickle cell pain in the chest, hips and legs today. Shortness of breath with dizziness. EXAM: CHEST  2 VIEW COMPARISON:  08/12/2015 radiographs. FINDINGS: The heart size and mediastinal contours are stable. The right subclavian Port-A-Cath appears unchanged at the SVC right atrial level. There is a probable catheter remnant in the left brachiocephalic vein which is also stable. The lungs are clear. There is no pleural effusion or pneumothorax. No acute osseous findings are seen. IMPRESSION: Stable chest.  No acute cardiopulmonary process. Electronically Signed   By: Carey Bullocks M.D.   On: 08/21/2015 14:19   I have personally reviewed and evaluated these images and lab results as part  of my medical decision-making.   EKG Interpretation   Date/Time:  Wednesday August 21 2015 13:03:47 EDT Ventricular Rate:  107 PR Interval:    QRS Duration: 83 QT Interval:  330 QTC Calculation: 441 R Axis:   84 Text Interpretation:  Sinus tachycardia Right atrial enlargement Consider  right ventricular hypertrophy Baseline wander in lead(s) I III aVL  Confirmed by Juleen China  MD, Taisha Pennebaker ([id number]) on 08/21/2015 1:06:48 PM      MDM   Final diagnoses:  Sickle cell pain crisis (HCC)    30 year old female with pain consistent with vaso-occlusive crisis. She does have a leukocytosis, but this is nonspecific. She had an elevated white count on  previous 2 blood draws as well. She is afebrile. Nontoxic. H/H has been stable over the last 2 weeks. Mild hypokalemia. This was supplemented.  Her chest x-ray is clear. She is treated symptomatically with opiates, NSAIDs and IVF.  Patient received several doses of dilaudid as well as toradol and IV fluids. She reports that her pain is only mildly improved.    Raeford RazorStephen Junious Ragone, MD 08/21/15 1840

## 2015-08-21 NOTE — ED Notes (Signed)
Called 3w for report/ room and nurse not ready Revonda Standard( allison said call back in 10 mins) . 08:29 pm

## 2015-08-22 DIAGNOSIS — G894 Chronic pain syndrome: Secondary | ICD-10-CM | POA: Diagnosis not present

## 2015-08-22 DIAGNOSIS — E876 Hypokalemia: Secondary | ICD-10-CM | POA: Diagnosis not present

## 2015-08-22 DIAGNOSIS — D571 Sickle-cell disease without crisis: Secondary | ICD-10-CM | POA: Diagnosis not present

## 2015-08-22 DIAGNOSIS — D5700 Hb-SS disease with crisis, unspecified: Secondary | ICD-10-CM | POA: Diagnosis not present

## 2015-08-22 DIAGNOSIS — R0789 Other chest pain: Secondary | ICD-10-CM | POA: Diagnosis not present

## 2015-08-22 LAB — RETICULOCYTES
RBC.: 2.39 MIL/uL — AB (ref 3.87–5.11)
RETIC COUNT ABSOLUTE: 212.7 10*3/uL — AB (ref 19.0–186.0)
Retic Ct Pct: 8.9 % — ABNORMAL HIGH (ref 0.4–3.1)

## 2015-08-22 LAB — BASIC METABOLIC PANEL
ANION GAP: 6 (ref 5–15)
BUN: 9 mg/dL (ref 6–20)
CO2: 18 mmol/L — ABNORMAL LOW (ref 22–32)
Calcium: 8.7 mg/dL — ABNORMAL LOW (ref 8.9–10.3)
Chloride: 112 mmol/L — ABNORMAL HIGH (ref 101–111)
Creatinine, Ser: 0.41 mg/dL — ABNORMAL LOW (ref 0.44–1.00)
GFR calc Af Amer: >60 mL/min (ref >60)
GLUCOSE: 115 mg/dL — AB (ref 65–99)
POTASSIUM: 3 mmol/L — AB (ref 3.5–5.1)
Sodium: 136 mmol/L (ref 135–145)

## 2015-08-22 LAB — CBC WITH DIFFERENTIAL/PLATELET
BASOS ABS: 0 10*3/uL (ref 0.0–0.1)
Basophils Relative: 0 %
EOS PCT: 1 %
Eosinophils Absolute: 0.2 10*3/uL (ref 0.0–0.7)
HEMATOCRIT: 22.4 % — AB (ref 36.0–46.0)
Hemoglobin: 8.1 g/dL — ABNORMAL LOW (ref 12.0–15.0)
LYMPHS PCT: 41 %
Lymphs Abs: 4.2 10*3/uL — ABNORMAL HIGH (ref 0.7–4.0)
MCH: 33.8 pg (ref 26.0–34.0)
MCHC: 36.2 g/dL — ABNORMAL HIGH (ref 30.0–36.0)
MCV: 93.3 fL (ref 78.0–100.0)
Monocytes Absolute: 1 10*3/uL (ref 0.1–1.0)
Monocytes Relative: 9 %
NEUTROS ABS: 5 10*3/uL (ref 1.7–7.7)
Neutrophils Relative %: 49 %
PLATELETS: 387 10*3/uL (ref 150–400)
RBC: 2.4 MIL/uL — AB (ref 3.87–5.11)
RDW: 19.9 % — ABNORMAL HIGH (ref 11.5–15.5)
WBC: 10.4 10*3/uL (ref 4.0–10.5)

## 2015-08-22 MED ORDER — SODIUM CHLORIDE 0.9 % IV SOLN
25.0000 mg | Freq: Four times a day (QID) | INTRAVENOUS | Status: DC | PRN
  Administered 2015-08-22 – 2015-08-23 (×4): 25 mg via INTRAVENOUS
  Filled 2015-08-22 (×7): qty 0.5

## 2015-08-22 MED ORDER — NALOXONE HCL 0.4 MG/ML IJ SOLN: 0.4000 mg | INTRAMUSCULAR | Status: DC | PRN

## 2015-08-22 MED ORDER — SODIUM CHLORIDE 0.9% FLUSH: 9.0000 mL | INTRAVENOUS | Status: DC | PRN

## 2015-08-22 MED ORDER — HYDROMORPHONE 1 MG/ML IV SOLN
INTRAVENOUS | Status: DC
  Administered 2015-08-22: 4.8 mg via INTRAVENOUS
  Administered 2015-08-22: 13.69 mg via INTRAVENOUS
  Administered 2015-08-22: 14:00:00 via INTRAVENOUS
  Administered 2015-08-23: 9.6 mg via INTRAVENOUS
  Administered 2015-08-23: 25 mg via INTRAVENOUS
  Administered 2015-08-23: 5.4 mg via INTRAVENOUS
  Administered 2015-08-23: 4.2 mg via INTRAVENOUS
  Filled 2015-08-22 (×2): qty 25

## 2015-08-22 MED ORDER — DIPHENHYDRAMINE HCL 25 MG PO CAPS: 25.0000 mg | ORAL_CAPSULE | Freq: Four times a day (QID) | ORAL | Status: DC | PRN

## 2015-08-22 MED ORDER — HYDROMORPHONE HCL 2 MG/ML IJ SOLN
2.0000 mg | INTRAMUSCULAR | Status: AC
  Administered 2015-08-22 (×3): 2 mg via INTRAVENOUS
  Filled 2015-08-22: qty 1

## 2015-08-22 MED ORDER — HYDROMORPHONE HCL 2 MG/ML IJ SOLN
2.0000 mg | INTRAMUSCULAR | Status: DC | PRN
  Filled 2015-08-22 (×2): qty 1

## 2015-08-22 MED ORDER — POTASSIUM CHLORIDE CRYS ER 20 MEQ PO TBCR
40.0000 meq | EXTENDED_RELEASE_TABLET | ORAL | Status: AC
  Administered 2015-08-22 (×2): 40 meq via ORAL
  Filled 2015-08-22 (×3): qty 2

## 2015-08-22 MED ORDER — HYDROMORPHONE HCL 2 MG/ML IJ SOLN
2.0000 mg | INTRAMUSCULAR | Status: DC | PRN
  Administered 2015-08-22 – 2015-08-23 (×5): 2 mg via INTRAVENOUS
  Filled 2015-08-22 (×7): qty 1

## 2015-08-22 MED ORDER — MORPHINE SULFATE ER 30 MG PO TBCR
30.0000 mg | EXTENDED_RELEASE_TABLET | Freq: Two times a day (BID) | ORAL | Status: DC
  Administered 2015-08-22 – 2015-08-23 (×3): 30 mg via ORAL
  Filled 2015-08-22 (×3): qty 1

## 2015-08-22 NOTE — Care Management Note (Signed)
Case Management Note  Patient Details  Name: Brittany CampbellMiranda Foster MRN: [id number] Date of Birth: 10/05/1985  Subjective/Objective:      30 [facility]              Action/Plan: From home with children.  Chart reviewed and CM following for DC needs.  Expected Discharge Date:   (unknown)               Expected Discharge Plan:  Home/Self Care  In-House Referral:     Discharge planning Services  CM Consult  Post Acute Care Choice:    Choice offered to:     DME Arranged:    DME Agency:     HH Arranged:    HH Agency:     Status of Service:  In process, will continue to follow  If discussed at Long Length of Stay Meetings, dates discussed:    Additional CommentsBartholome Bill:  Makenley Shimp H, RN 08/22/2015, 2:53 PM  [phone]

## 2015-08-22 NOTE — Progress Notes (Addendum)
SICKLE CELL SERVICE PROGRESS NOTE  Brittany CampbellMiranda Foster VHQ:[id number] DOB: 03/17/1985 DOA: 08/21/2015 PCP: Jeanann LewandowskyJEGEDE, OLUGBEMIGA, MD  Assessment/Plan: Principal Problem:   Vasoocclusive sickle cell crisis (HCC) Active Problems:   Anemia   Depression   Hypokalemia   Chronic pain syndrome   Sickle cell anemia with pain (HCC)  1. Hb SS with crisis: Pt has only been receiving Oxycodone and Dilaudid 1 mg  q 1 hour AS NEEDED since admission. She is opiate tolerant and this gives her less MME than she takes at home. She reports her pain as worse today than on admission. I will discontinue oxycodone, place patient on a Dilaudid PCA, order Toradol and give some clinician assisted doses. Currently her MME that she is receiving is less that her daily dose which may be exacerbating her chronic pain. However I am not sure that she is in crisis.  Will re-assess clinical course and  effectiveness of pain regimen tomorrow. Also continue IVF at current rate. 2. Metabolic Acidosis: Pt had a decreased bicarbonate level in the blood. Etiology unclear. Will re-assess after adequate hydration. However Topamax could also be implicated. This appears to be acute as a review of labs shows recent bicarbonate WNL's.  3. Anemia of chronic disease: Hb currently at baseline.  4. Chronic pain: Resume MS Contin.    Code Status: Full Code Family Communication: N/A Disposition Plan: Not yet ready for discharge  Brittany Foster A.  Pager [phone]. If 7PM-7AM, please contact night-coverage.  08/22/2015, 12:54 PM    Interim History: Pt reports that her pain s now 9-10/10 which is worse than on admission. She reports pain is localized ot chest wall, shoulders, hips and legs.   Consultants:  None  Procedures:  None  Antibiotics:  None   Objective: Filed Vitals:   08/21/15 2049 08/21/15 2241 08/22/15 0532 08/22/15 1027  BP: 113/69 105/71 95/57 97/59   Pulse: 78 82 76 73  Temp: 99.4 F (37.4 C) 98.2 F (36.8 C) 98.2  F (36.8 C) 98.6 F (37 C)  TempSrc: Oral Oral Oral Oral  Resp: 15 17 15 18   Height:  5' (1.524 m)    Weight:  125 lb (56.7 kg)    SpO2: 100% 100% 98% 98%   Weight change:  No intake or output data in the 24 hours ending 08/22/15 1254  General: Alert, awake, oriented x3, in mild apparent distress.  HEENT: Slope/AT PEERL, EOMI, anicteric Neck: Trachea midline,  no masses, no thyromegal,y no JVD, no carotid bruit OROPHARYNX:  Moist, No exudate/ erythema/lesions.  Heart: Regular rate and rhythm, without murmurs, rubs, gallops, PMI non-displaced, no heaves or thrills on palpation.  Lungs: Clear to auscultation, no wheezing or rhonchi noted. No increased vocal fremitus resonant to percussion  Abdomen: Soft, nontender, nondistended, positive bowel sounds, no masses no hepatosplenomegaly noted..  Neuro: No focal neurological deficits noted cranial nerves II through XII grossly intact.  Strength at functional baseline in bilateral upper and lower extremities. Musculoskeletal: No warmth swelling or erythema around joints, no spinal tenderness noted. Psychiatric: Patient alert and oriented x3, good insight and cognition, good recent to remote recall.    Data Reviewed: Basic Metabolic Panel:  Recent Labs Lab 08/21/15 1350 08/22/15 0540  NA 139 136  K 3.0* 3.0*  CL 114* 112*  CO2 18* 18*  GLUCOSE 109* 115*  BUN 8 9  CREATININE 0.32* 0.41*  CALCIUM 9.4 8.7*  MG 2.1  --    Liver Function Tests:  Recent Labs Lab 08/21/15 1350  AST 36  ALT 29  ALKPHOS 90  BILITOT 2.7*  PROT 8.1  ALBUMIN 4.8   No results for input(s): LIPASE, AMYLASE in the last 168 hours. No results for input(s): AMMONIA in the last 168 hours. CBC:  Recent Labs Lab 08/21/15 1350 08/22/15 0540  WBC 14.7* 10.4  NEUTROABS 11.2* 5.0  HGB 9.4* 8.1*  HCT 26.4* 22.4*  MCV 93.0 93.3  PLT 429* 387   Cardiac Enzymes: No results for input(s): CKTOTAL, CKMB, CKMBINDEX, TROPONINI in the last 168 hours. BNP  (last 3 results) No results for input(s): BNP in the last 8760 hours.  ProBNP (last 3 results) No results for input(s): PROBNP in the last 8760 hours.  CBG: No results for input(s): GLUCAP in the last 168 hours.  No results found for this or any previous visit (from the past 240 hour(s)).   Studies: Dg Chest 2 View  08/21/2015  CLINICAL DATA:  Sickle cell pain in the chest, hips and legs today. Shortness of breath with dizziness. EXAM: CHEST  2 VIEW COMPARISON:  08/12/2015 radiographs. FINDINGS: The heart size and mediastinal contours are stable. The right subclavian Port-A-Cath appears unchanged at the SVC right atrial level. There is a probable catheter remnant in the left brachiocephalic vein which is also stable. The lungs are clear. There is no pleural effusion or pneumothorax. No acute osseous findings are seen. IMPRESSION: Stable chest.  No acute cardiopulmonary process. Electronically Signed   By: Carey BullocksWilliam  Veazey M.D.   On: 08/21/2015 14:19   Dg Chest 2 View  08/12/2015  CLINICAL DATA:  Left-sided chest pain and shortness of breath for 2 days, history of sickle cell EXAM: CHEST  2 VIEW COMPARISON:  07/12/2015 FINDINGS: Cardiac shadow is within normal limits. The lungs are clear bilaterally. The port has been repositioned in satisfactory position at the cavoatrial junction. No bony abnormality is seen. IMPRESSION: No active cardiopulmonary disease. Electronically Signed   By: Alcide CleverMark  Lukens M.D.   On: 08/12/2015 16:37    Scheduled Meds: . DULoxetine  60 mg Oral Daily  . enoxaparin (LOVENOX) injection  40 mg Subcutaneous Q24H  . folic acid  1 mg Oral Daily  . HYDROmorphone   Intravenous Q4H  .  HYDROmorphone (DILAUDID) injection  2 mg Intravenous Q2H  . hydroxyurea  1,000 mg Oral Daily  . ketorolac  30 mg Intravenous Q6H  . morphine  30 mg Oral Q12H  . potassium chloride  40 mEq Oral Q2H  . senna-docusate  1 tablet Oral BID  . topiramate  50 mg Oral BID   Continuous Infusions: .  dextrose 5 % and 0.45% NaCl 100 mL/hr at 08/21/15 2316    Time spent 29 minutes. Greater than 50% time involved face to face contact with the patient for assessment, counseling and coordination of care.

## 2015-08-22 NOTE — Progress Notes (Signed)
Nursing Note: Pt refuses incentive spirometer use. Explained use and purpose and importance and pt states she "does not want it".wbb

## 2015-08-23 DIAGNOSIS — R0789 Other chest pain: Secondary | ICD-10-CM

## 2015-08-23 DIAGNOSIS — G894 Chronic pain syndrome: Secondary | ICD-10-CM

## 2015-08-23 DIAGNOSIS — D571 Sickle-cell disease without crisis: Secondary | ICD-10-CM | POA: Diagnosis not present

## 2015-08-23 LAB — BASIC METABOLIC PANEL
ANION GAP: 4 — AB (ref 5–15)
BUN: 9 mg/dL (ref 6–20)
CHLORIDE: 114 mmol/L — AB (ref 101–111)
CO2: 20 mmol/L — AB (ref 22–32)
Calcium: 8.5 mg/dL — ABNORMAL LOW (ref 8.9–10.3)
Creatinine, Ser: 0.41 mg/dL — ABNORMAL LOW (ref 0.44–1.00)
GFR calc Af Amer: >60 mL/min (ref >60)
GFR calc non Af Amer: >60 mL/min (ref >60)
Glucose, Bld: 97 mg/dL (ref 65–99)
Potassium: 3.8 mmol/L (ref 3.5–5.1)
Sodium: 138 mmol/L (ref 135–145)

## 2015-08-23 MED ORDER — HEPARIN SOD (PORK) LOCK FLUSH 100 UNIT/ML IV SOLN
500.0000 [IU] | INTRAVENOUS | Status: AC | PRN
Start: 2015-08-23 — End: 2015-08-23
  Administered 2015-08-23: 500 [IU]
  Filled 2015-08-23: qty 5

## 2015-08-23 NOTE — Progress Notes (Signed)
Patient ID: Brittany Foster, female   DOB: 03/27/1985, 30 y.o.   MRN: [id number]  Spoke with patients PMD Dr. Hyman HopesJegede and discussed patient's current hospitalization. Pt has been going from ED to ED with c/o chest pain and had a full work 2 hospitals ago and an ED workup at the last hospital all of which was found to be negative. She also has a care-plan with her PMD which she has agreed to but with which she has not followed through. Dr. Louis MeckelJegede;s recommendation is that if without any acute medical risk she be discharged home on her home medications to follow with him in the clinic to address this chronic pain issue.   Brittany Lupien A.

## 2015-08-23 NOTE — Clinical Social Work Note (Signed)
Pt is ready for d/c today.  CSW coordinated transportation for pt.  [facility]  CSW signing off as there are not further needs at this time.  LaurieLynn Zada Haser, [place] [phone]

## 2015-08-23 NOTE — Discharge Summary (Signed)
Brittany Foster Montemayor MRN: [id number] DOB/AGE: 31/03/1985 31 y.o.  Admit date: 08/21/2015 Discharge date: 08/23/2015  Primary Care Physician:  Jeanann LewandowskyJEGEDE, OLUGBEMIGA, MD   Discharge Diagnoses:   Patient Active Problem List   Diagnosis Date Noted  . Vasoocclusive sickle cell crisis (HCC) 08/21/2015  . Depression, major, recurrent (HCC)   . Family planning, Depo-Provera contraception monitoring/administration 08/08/2015  . Sickle cell anemia with crisis (HCC) 07/12/2015  . Sickle cell anemia with pain (HCC) 05/28/2015  . Intractable pain   . Chest pain 03/07/2015  . Leukocytosis 03/07/2015  . Vitamin D deficiency 01/08/2015  . Chronic pain syndrome 01/08/2015  . Sickle cell crisis (HCC) 10/22/2014  . Hypokalemia 08/21/2014  . Depression 08/09/2014  . 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 Sickle cell disease with crisis (HCC) 07/12/2014  . Hb-SS disease with crisis (HCC) 06/11/2014  . Pericardial effusion   . Anemia 02/09/2014  . Neuropathy (HCC) 02/09/2014  . Chronic headache disorder 02/09/2014  . Abnormal CT scan, chest   . Sickle cell pain crisis (HCC) 12/30/2013    DISCHARGE MEDICATION:   Medication List    STOP taking these medications        acetaminophen 325 MG tablet  Commonly known as:  TYLENOL      TAKE these medications        DULoxetine 60 MG capsule  Commonly known as:  CYMBALTA  Take 1 capsule (60 mg total) by mouth daily.     folic acid 1 MG tablet  Commonly known as:  FOLVITE  Take 1 tablet (1 mg total) by mouth daily.     hydroxyurea 500 MG capsule  Commonly known as:  HYDREA  TAKE 2 CAPSULES BY MOUTH DAILY. MAY TAKE WITH FOOD TO MINIMIZE GI SIDE EFFECTS.     ibuprofen 200 MG tablet  Commonly known as:  ADVIL,MOTRIN  Take 400 mg by mouth every 6 (six) hours as needed for moderate pain.     morphine 30 MG 12 hr tablet  Commonly known as:  MS CONTIN  Take 1 tablet (30 mg total) by mouth every 12 (twelve) hours.     oxyCODONE-acetaminophen  10-325 MG tablet  Commonly known as:  PERCOCET  Take 1 tablet by mouth every 4 (four) hours as needed for pain.     Topiramate ER 100 MG Cp24  Take 100 mg by mouth at bedtime.          Consults:     SIGNIFICANT DIAGNOSTIC STUDIES:  Dg Chest 2 View  08/21/2015  CLINICAL DATA:  Sickle cell pain in the chest, hips and legs today. Shortness of breath with dizziness. EXAM: CHEST  2 VIEW COMPARISON:  08/12/2015 radiographs. FINDINGS: The heart size and mediastinal contours are stable. The right subclavian Port-A-Cath appears unchanged at the SVC right atrial level. There is a probable catheter remnant in the left brachiocephalic vein which is also stable. The lungs are clear. There is no pleural effusion or pneumothorax. No acute osseous findings are seen. IMPRESSION: Stable chest.  No acute cardiopulmonary process. Electronically Signed   By: Carey BullocksWilliam  Veazey M.D.   On: 08/21/2015 14:19   Dg Chest 2 View  08/12/2015  CLINICAL DATA:  Left-sided chest pain and shortness of breath for 2 days, history of sickle cell EXAM: CHEST  2 VIEW COMPARISON:  07/12/2015 FINDINGS: Cardiac shadow is within normal limits. The lungs are clear bilaterally. The port has been repositioned in satisfactory position at the cavoatrial junction. No bony abnormality is seen. IMPRESSION: No  active cardiopulmonary disease. Electronically Signed   By: Alcide CleverMark  Lukens M.D.   On: 08/12/2015 16:37       No results found for this or any previous visit (from the past 240 hour(s)).  BRIEF ADMITTING H & P: Brittany Foster Glomb is a 30 y.o. female with medical history significant of sickle cell anemia (SS disease) presenting with pain crisis. Patient reports that for the last 2 days she has been having very bad pain in legs, hips, back, and chest. Has been drinking more and taking pain medication without relief. Today the pain was too much to bear. This all feels like her usual exacerbation other than worse chest tightness than usual.  Her usual regimen is Oxy IR 10mg  or MS Contin but she is being taken off MS Contin due to a morphine allergy and she is not really taking it now but has taken it sporadically over the last 3 days. [facility]. Low-grade fever to 100. No chills. +SOB, no cough. +nausea. No rashes.  Does not attend school or work. Does report a lot of walking and spending time with her daughter since school is out.   Hospital Course:  Hemoglobin SS who is admitted with a concern for possible sickle cell crisis. On admission the patient was placed on oral medications with IV bolus doses for breakthrough. Unfortunately however this was much lower than her usual daily morphine milligrams equivalent use and her pain was more intensified then previous to admission. In light of this the patient was placed on a PCA, her morphine sulfate restarted with the intention of transitioning her back to her usual regimen. I reviewed all the patient's records and was suspicious that the patient with hemoglobin SS she was not in crisis as she had no evidence of inflammation increased reticulocytosis to even suggest a response to decreased circulating red blood cells in the periphery. On the suspicion I called her primary care physician (Dr. Hyman HopesJegede)  and spoke with him at length. It appears that Brittany Foster has been going from one ER to the other soliciting IV pain medications. She has had multiple workups for her complaint of chest pain that has been her presenting complaint all of which have been negative for any cardiac issues. On her presentation here she had a chest x-ray performed which showed no acute cardiopulmonary process and she has been saturating well on room air. It is my medical opinion that this patient is not in sickle cell crisis but really is seeking treatment for her chronic pain which is now exceeding the threshold of the chronic pain medication that she is receiving. This patient  is better served in the pain clinic to mitigate her chronic pain issues. Nevertheless at this point she has no acute medical conditions that require further hospitalization and no acute medical riskt that requires her remaining here for further treatment. She did have a mild metabolic alkalosis however I do not find this to be clinically significant as the patient is well-appearing, she has no compensatory increase in her respirations or work of breathing and her vital signs are all stable. Furthermore the patient is ambulatory without any need for oxygen or assistance. That she is functionally at baseline. I have discussed all the above with her primary care physician and am discharging the patient home in stable condition. Please note that this concern of the patient presented for chronic pain to be treated acutely has been an ongoing concern for  more than a year in the primary care setting and she was referred to a pain clinic and subsequently dismissed. Her primary care physician has taken exhaustive measures to be able to continue appropriately to treat the patient without abandoning her care however Hinda Kehr has not been compliant with any of the agreed to care plan. There has been comprehensive care conference in the ambulatory setting which included the physician,  social work, case Production designer, theatre/television/film, community-based organization, and account care organization. There is a care plan that has been set in place and thus far it does not seem that the patient although agreement is adhering to the care plan. Repeat in extensive measures have been attempted to try to help this patient with her chronic opiate dependency problem. Per Dr. Hyman Hopes she's will follow-up with him in the office on 08/27/2015. She is to continue on her chronic pain medications as previously prescribed.    . Disposition and Follow-up: Patient is discharged home in stable condition. She is fully ambulatory and without any need for supplemental oxygen at  this time. She has a follow-up appointment scheduled for 08/27/2015 at 10 AM in the morning with Dr. Hyman Hopes.   DISCHARGE EXAM:  General: Alert, awake, oriented x3, in no apparent distress.  Vital Signs: BP 94/56, HR 73, T 98.5 F (36.9 C), temperature source Oral, RR 13, height 5' (1.524 m), weight 125 lb (56.7 kg), last menstrual period 08/01/2015, SpO2 100 %. HEENT: Menan/AT PEERL, EOMI, anicteric Neck: Trachea midline, no masses, no thyromegal,y no JVD, no carotid bruit OROPHARYNX: Moist, No exudate/ erythema/lesions.  Heart: Regular rate and rhythm, without murmurs, rubs, gallops or S3. PMI non-displaced. Exam reveals no decreased pulses. Pulmonary/Chest: Normal effort. Breath sounds normal. No. Apnea. Clear to auscultation,no stridor,  no wheezing and no rhonchi noted. No respiratory distress and no tenderness noted. Abdomen: Soft, nontender, nondistended, normal bowel sounds, no masses no hepatosplenomegaly noted. No fluid wave and no ascites. There is no guarding or rebound. Neuro: Alert and oriented to person, place and time. Normal motor skills, Displays no atrophy or tremors and exhibits normal muscle tone.  No focal neurological deficits noted cranial nerves II through XII grossly intact. No sensory deficit noted. Strength at baseline in bilateral upper and lower extremities. Gait normal. Musculoskeletal: No warm swelling or erythema around joints, no spinal tenderness noted. Psychiatric: Patient alert and oriented x3, good insight and cognition, good recent to remote recall. Mood, memory, affect and judgement normal Lymph node survey: No cervical axillary or inguinal lymphadenopathy noted. Skin: Skin is warm and dry. No bruising, no ecchymosis and no rash noted. Pt is not diaphoretic. No erythema. No pallor      Recent Labs  08/21/15 1350 08/22/15 0540 08/23/15 0900  NA 139 136 138  K 3.0* 3.0* 3.8  CL 114* 112* 114*  CO2 18* 18* 20*  GLUCOSE 109* 115* 97  BUN 8 9 9    CREATININE 0.32* 0.41* 0.41*  CALCIUM 9.4 8.7* 8.5*  MG 2.1  --   --     Recent Labs  08/21/15 1350  AST 36  ALT 29  ALKPHOS 90  BILITOT 2.7*  PROT 8.1  ALBUMIN 4.8   No results for input(s): LIPASE, AMYLASE in the last 72 hours.  Recent Labs  08/21/15 1350 08/22/15 0540  WBC 14.7* 10.4  NEUTROABS 11.2* 5.0  HGB 9.4* 8.1*  HCT 26.4* 22.4*  MCV 93.0 93.3  PLT 429* 387     Total time spent including face to face and  decision making was greater than 30 minutes  Signed: Renaud Celli A. 08/23/2015, 11:35 AM

## 2015-08-23 NOTE — Progress Notes (Signed)
Patient was informed and encouraged to ambulate to check her oxgygen level, patient refused the activity.

## 2015-08-23 NOTE — Progress Notes (Signed)
Patient's oxygen level on RA was 100 %.

## 2015-08-23 NOTE — Progress Notes (Signed)
Patient d/c  Home, stable. D/c instructions given, verbalized understanding.

## 2015-08-26 ENCOUNTER — Other Ambulatory Visit: Payer: Self-pay | Admitting: Hematology

## 2015-08-26 ENCOUNTER — Telehealth (HOSPITAL_COMMUNITY): Payer: Self-pay | Admitting: *Deleted

## 2015-08-26 ENCOUNTER — Telehealth: Payer: Self-pay

## 2015-08-26 DIAGNOSIS — D571 Sickle-cell disease without crisis: Secondary | ICD-10-CM

## 2015-08-26 MED ORDER — OXYCODONE-ACETAMINOPHEN 10-325 MG PO TABS: 1.0000 | ORAL_TABLET | ORAL | Status: DC | PRN

## 2015-08-26 NOTE — Telephone Encounter (Signed)
Reviewed [place] Substance Reporting system prior to prescribing opiate medications, no inconsistencies noted.  Meds ordered this encounter  Medications  . oxyCODONE-acetaminophen (PERCOCET) 10-325 MG tablet    Sig: Take 1 tablet by mouth every 4 (four) hours as needed for pain.    Dispense:  90 tablet    Refill:  0    Order Specific Question:   Supervising Provider    Answer:   JEGEDE, OLUGBEMIGA E [[id number]]     Alexandre Faries M, FNP 

## 2015-08-26 NOTE — Telephone Encounter (Signed)
Patient requesting medication refill of oxycodone / LOV 08/08/2015

## 2015-08-26 NOTE — Telephone Encounter (Signed)
Refill request for percocet 10/325mg . LOV 08/08/2015. Please advise. Thanks!

## 2015-08-26 NOTE — Telephone Encounter (Signed)
Pt called with complains of 8/10 pain, denies CP, N/V/D, abd pain, fever. Pt states she needs prescriptions before going out of town as well. Pt is reminded about the prescription renewal policy. Erin SonsLaChina, NP notified and aware. Pt is advised to stay at home, rest, stay hydrated and take her home medication. Gilman Olazabal, [name],RN

## 2015-08-27 ENCOUNTER — Ambulatory Visit: Payer: Medicaid Other | Admitting: Internal Medicine

## 2015-08-27 LAB — HEMOGLOBINOPATHY EVALUATION
HGB A2 QUANT: 4.7 % — AB (ref 0.7–3.1)
HGB C: 0 %
Hgb A: 29.7 % — ABNORMAL LOW (ref 94.0–98.0)
Hgb F Quant: 5.6 % — ABNORMAL HIGH (ref 0.0–2.0)
Hgb S Quant: 60 % — ABNORMAL HIGH

## 2015-09-02 ENCOUNTER — Emergency Department (HOSPITAL_COMMUNITY)
Admission: EM | Admit: 2015-09-02 | Discharge: 2015-09-02 | Disposition: A | Discharge status: Home / Self Care | Payer: Medicaid Other | Attending: Emergency Medicine | Admitting: Emergency Medicine

## 2015-09-02 ENCOUNTER — Emergency Department (HOSPITAL_COMMUNITY): Payer: Medicaid Other

## 2015-09-02 ENCOUNTER — Encounter (HOSPITAL_COMMUNITY): Payer: Self-pay | Admitting: Emergency Medicine

## 2015-09-02 DIAGNOSIS — Z79899 Other long term (current) drug therapy: Secondary | ICD-10-CM | POA: Diagnosis not present

## 2015-09-02 DIAGNOSIS — R0602 Shortness of breath: Secondary | ICD-10-CM | POA: Diagnosis present

## 2015-09-02 DIAGNOSIS — F339 Major depressive disorder, recurrent, unspecified: Secondary | ICD-10-CM | POA: Insufficient documentation

## 2015-09-02 DIAGNOSIS — F17200 Nicotine dependence, unspecified, uncomplicated: Secondary | ICD-10-CM | POA: Insufficient documentation

## 2015-09-02 DIAGNOSIS — D57219 Sickle-cell/Hb-C disease with crisis, unspecified: Secondary | ICD-10-CM | POA: Insufficient documentation

## 2015-09-02 DIAGNOSIS — Z791 Long term (current) use of non-steroidal anti-inflammatories (NSAID): Secondary | ICD-10-CM | POA: Diagnosis not present

## 2015-09-02 DIAGNOSIS — D5700 Hb-SS disease with crisis, unspecified: Secondary | ICD-10-CM

## 2015-09-02 LAB — COMPREHENSIVE METABOLIC PANEL
ALT: 29 U/L (ref 14–54)
ANION GAP: 7 (ref 5–15)
AST: 36 U/L (ref 15–41)
Albumin: 4.6 g/dL (ref 3.5–5.0)
Alkaline Phosphatase: 75 U/L (ref 38–126)
BILIRUBIN TOTAL: 2.2 mg/dL — AB (ref 0.3–1.2)
BUN: 8 mg/dL (ref 6–20)
CHLORIDE: 111 mmol/L (ref 101–111)
CO2: 22 mmol/L (ref 22–32)
Calcium: 9.3 mg/dL (ref 8.9–10.3)
Creatinine, Ser: 0.33 mg/dL — ABNORMAL LOW (ref 0.44–1.00)
Glucose, Bld: 128 mg/dL — ABNORMAL HIGH (ref 65–99)
POTASSIUM: 3.2 mmol/L — AB (ref 3.5–5.1)
Sodium: 140 mmol/L (ref 135–145)
TOTAL PROTEIN: 8.1 g/dL (ref 6.5–8.1)

## 2015-09-02 LAB — RETICULOCYTES
RBC.: 2.56 MIL/uL — AB (ref 3.87–5.11)
RETIC CT PCT: 9.7 % — AB (ref 0.4–3.1)
Retic Count, Absolute: 248.3 10*3/uL — ABNORMAL HIGH (ref 19.0–186.0)

## 2015-09-02 LAB — CBC WITH DIFFERENTIAL/PLATELET
BASOS ABS: 0 10*3/uL (ref 0.0–0.1)
Basophils Relative: 0 %
EOS PCT: 0 %
Eosinophils Absolute: 0 10*3/uL (ref 0.0–0.7)
HEMATOCRIT: 24.9 % — AB (ref 36.0–46.0)
Hemoglobin: 8.9 g/dL — ABNORMAL LOW (ref 12.0–15.0)
LYMPHS ABS: 2.6 10*3/uL (ref 0.7–4.0)
LYMPHS PCT: 24 %
MCH: 34.8 pg — AB (ref 26.0–34.0)
MCHC: 35.7 g/dL (ref 30.0–36.0)
MCV: 97.3 fL (ref 78.0–100.0)
MONO ABS: 1 10*3/uL (ref 0.1–1.0)
MONOS PCT: 9 %
NEUTROS ABS: 7.1 10*3/uL (ref 1.7–7.7)
Neutrophils Relative %: 67 %
PLATELETS: 371 10*3/uL (ref 150–400)
RBC: 2.56 MIL/uL — ABNORMAL LOW (ref 3.87–5.11)
RDW: 19.5 % — AB (ref 11.5–15.5)
WBC: 10.6 10*3/uL — ABNORMAL HIGH (ref 4.0–10.5)

## 2015-09-02 LAB — I-STAT TROPONIN, ED: TROPONIN I, POC: 0 ng/mL (ref 0.00–0.08)

## 2015-09-02 LAB — HCG, QUANTITATIVE, PREGNANCY: hCG, Beta Chain, Quant, S: <1 m[IU]/mL (ref <5)

## 2015-09-02 MED ORDER — DIPHENHYDRAMINE HCL 50 MG/ML IJ SOLN
25.0000 mg | Freq: Once | INTRAMUSCULAR | Status: AC
  Administered 2015-09-02: 25 mg via INTRAVENOUS
  Filled 2015-09-02: qty 1

## 2015-09-02 MED ORDER — LORAZEPAM 2 MG/ML IJ SOLN: 1.0000 mg | Freq: Once | INTRAMUSCULAR | Status: DC

## 2015-09-02 MED ORDER — HYDROMORPHONE HCL 2 MG/ML IJ SOLN
2.0000 mg | Freq: Once | INTRAMUSCULAR | Status: AC
  Administered 2015-09-02: 2 mg via INTRAVENOUS
  Filled 2015-09-02: qty 1

## 2015-09-02 MED ORDER — KETAMINE HCL 10 MG/ML IJ SOLN
Freq: Once | INTRAMUSCULAR | Status: AC
  Administered 2015-09-02: 20:00:00 via INTRAVENOUS
  Filled 2015-09-02: qty 100

## 2015-09-02 MED ORDER — PROMETHAZINE HCL 25 MG PO TABS
25.0000 mg | ORAL_TABLET | ORAL | Status: DC | PRN
  Administered 2015-09-02: 25 mg via ORAL
  Filled 2015-09-02: qty 1

## 2015-09-02 MED ORDER — DIPHENHYDRAMINE HCL 50 MG/ML IJ SOLN
12.5000 mg | Freq: Once | INTRAMUSCULAR | Status: AC
  Administered 2015-09-02: 12.5 mg via INTRAVENOUS
  Filled 2015-09-02: qty 1

## 2015-09-02 MED ORDER — KETAMINE HCL-SODIUM CHLORIDE 100-0.9 MG/10ML-% IV SOSY: 0.1500 mg/kg | PREFILLED_SYRINGE | Freq: Once | INTRAVENOUS | Status: DC

## 2015-09-02 MED ORDER — SODIUM CHLORIDE 0.45 % IV SOLN
INTRAVENOUS | Status: DC
  Administered 2015-09-02: 15:00:00 via INTRAVENOUS

## 2015-09-02 MED ORDER — POTASSIUM CHLORIDE CRYS ER 20 MEQ PO TBCR
40.0000 meq | EXTENDED_RELEASE_TABLET | Freq: Once | ORAL | Status: AC
  Administered 2015-09-02: 40 meq via ORAL
  Filled 2015-09-02: qty 2

## 2015-09-02 MED ORDER — HEPARIN SOD (PORK) LOCK FLUSH 100 UNIT/ML IV SOLN: 500.0000 [IU] | Freq: Once | INTRAVENOUS | Status: DC

## 2015-09-02 MED ORDER — IOPAMIDOL (ISOVUE-370) INJECTION 76%
100.0000 mL | Freq: Once | INTRAVENOUS | Status: AC | PRN
  Administered 2015-09-02: 100 mL via INTRAVENOUS

## 2015-09-02 MED ORDER — KETOROLAC TROMETHAMINE 30 MG/ML IJ SOLN
30.0000 mg | INTRAMUSCULAR | Status: AC
  Administered 2015-09-02: 30 mg via INTRAVENOUS
  Filled 2015-09-02: qty 1

## 2015-09-02 MED ORDER — HYDROMORPHONE HCL 2 MG/ML IJ SOLN
1.4000 mg | Freq: Once | INTRAMUSCULAR | Status: AC
  Administered 2015-09-02: 1.4 mg via INTRAVENOUS
  Filled 2015-09-02: qty 1

## 2015-09-02 NOTE — ED Notes (Signed)
Pt left, refusing to allow writer to deaccess port with Heparin flush. Writer discussed concerns of leaving without properly heparin locking her port. Pt stated "I don't care. That's two minutes too long." Pt noted to remove port at this time and ambulated off.

## 2015-09-02 NOTE — ED Notes (Signed)
Pt asking for pain medication.  Joni Reiningicole PA made aware and will speak w/ Pt.

## 2015-09-02 NOTE — ED Notes (Signed)
PA at bedside.

## 2015-09-02 NOTE — Discharge Instructions (Signed)
Continue taking your home pain meds as prescribed. I recommend following up with your sickle cell doctor this week regarding management of your chronic pain. Please return to the Emergency Department if symptoms worsen or new onset of fever, chest pain, difficulty breathing, shortness of breath, coughing up blood, numbness, tingling, weakness, syncope, seizure.

## 2015-09-02 NOTE — ED Notes (Signed)
Pt given Coke, crackers, and peanut butter.

## 2015-09-02 NOTE — ED Notes (Signed)
Patient transported to X-ray 

## 2015-09-02 NOTE — ED Provider Notes (Signed)
CSN: [id number]     Arrival date & time 09/02/15  1240 History   First MD Initiated Contact with Patient 09/02/15 1329     Chief Complaint  Patient presents with  . Sickle Cell Pain Crisis     (Consider location/radiation/quality/duration/timing/severity/associated sxs/prior Treatment) HPI   Patient is a 30 year old female with past medical history of sickle cell who presents to the ED with complaint of sickle cell pain crisis. Patient reports last night she began to have pain in bilateral legs, lower back, right arm and chest. She notes she typically does not have chest pain with her sickle cell pain crises but reports having constant heaviness to the left side of her chest with associated shortness of breath. She reports her remaining pain is consistent with her typical sickle cell pain. Patient reports chest pain is worse with movement, denies any alleviating factors. She notes she took one dose of her 10 mg oxycodone at home last night with mild relief but states when she took a second dose around 6 AM she did not have any relief. Endorses associated nausea and epigastric pain. Denies fever, chills, headache, nasal congestion, rhinorrhea, sore throat, cough, wheezing, vomiting, diarrhea, urinary symptoms.   Past Medical History  Diagnosis Date  . Sickle cell anemia (HCC)   . Anemia   . Migraines   . Depression, major, recurrent [facility])    Past Surgical History  Procedure Laterality Date  . Tubal ligation    . Cesarean section    . Port a cath placement Right     about 6-7 years ago  . Cholecystectomy  2000  . Multiple tooth extractions N/A    Family History  Problem Relation Age of Onset  . Sickle cell anemia Other   . Sickle cell trait Father   . Sickle cell trait Mother    Social History  Substance Use Topics  . Smoking status: Current Some Day Smoker  . Smokeless tobacco: Never Used     Comment: 08/21/15 - only smokes occasionally  . Alcohol Use: No   OB History    No  data available     Review of Systems  Respiratory: Positive for shortness of breath.   Cardiovascular: Positive for chest pain.  Gastrointestinal: Positive for nausea and abdominal pain (epigastric).  Musculoskeletal: Positive for back pain and arthralgias (left arm, BLE).  All other systems reviewed and are negative.     Allergies  Ultram; Zofran; Buprenorphine hcl; Morphine and related; and Tape  Home Medications   Prior to Admission medications   Medication Sig Start Date End Date Taking? Authorizing Provider  DULoxetine (CYMBALTA) 60 MG capsule Take 1 capsule (60 mg total) by mouth daily. 08/08/15  Yes [name], FNP  folic acid (FOLVITE) 1 MG tablet Take 1 tablet (1 mg total) by mouth daily. 08/08/15  Yes [name], FNP  hydroxyurea (HYDREA) 500 MG capsule TAKE 2 CAPSULES BY MOUTH DAILY. MAY TAKE WITH FOOD TO MINIMIZE GI SIDE EFFECTS. 08/08/15  Yes [name], FNP  ibuprofen (ADVIL,MOTRIN) 200 MG tablet Take 400 mg by mouth every 6 (six) hours as needed for moderate pain.    Yes Historical Provider, MD  oxyCODONE-acetaminophen (PERCOCET) 10-325 MG tablet Take 1 tablet by mouth every 4 (four) hours as needed for pain. 08/26/15  Yes [name], FNP  Topiramate ER 100 MG CP24 Take 100 mg by mouth at bedtime.   Yes Historical Provider, MD  morphine (MS CONTIN) 30 MG 12 hr  tablet Take 1 tablet (30 mg total) by mouth every 12 (twelve) hours. Patient not taking: Reported on 09/02/2015 07/26/15   Quentin Angstlugbemiga E Jegede, MD   BP 115/77 mmHg  Pulse 92  Temp(Src) 99.2 F (37.3 C) (Oral)  Resp 23  Ht 5' (1.524 m)  Wt 56.7 kg  BMI 24.41 kg/m2  SpO2 97%  LMP 08/01/2015 (Approximate) Physical Exam  Constitutional: She is oriented to person, place, and time. She appears well-developed and well-nourished. No distress.  HENT:  Head: Normocephalic and atraumatic.  Mouth/Throat: Oropharynx is clear and moist. No oropharyngeal exudate.  Eyes: Conjunctivae and EOM are normal.  Pupils are equal, round, and reactive to light. Right eye exhibits no discharge. Left eye exhibits no discharge. No scleral icterus.  Neck: Normal range of motion. Neck supple.  Cardiovascular: Normal rate, regular rhythm, normal heart sounds and intact distal pulses.   HR 96  Pulmonary/Chest: Effort normal and breath sounds normal. No respiratory distress. She has no wheezes. She has no rales. She exhibits tenderness (lower midsternal and left anterior lower chest wall mildly TTP).  Abdominal: Soft. Bowel sounds are normal. She exhibits no distension and no mass. There is no tenderness. There is no rebound and no guarding.  Musculoskeletal: Normal range of motion. She exhibits no edema.  No midline C, T, or L tenderness. Full range of motion of neck and back. Full range of motion of bilateral upper and lower extremities, with 5/5 strength. Sensation intact. 2+ radial and PT pulses. Cap refill <2 seconds.   Lymphadenopathy:    She has no cervical adenopathy.  Neurological: She is alert and oriented to person, place, and time.  Skin: Skin is warm and dry. She is not diaphoretic.  Nursing note and vitals reviewed.   ED Course  Procedures (including critical care time) Labs Review Labs Reviewed  CBC WITH DIFFERENTIAL/PLATELET - Abnormal; Notable for the following:    WBC 10.6 (*)    RBC 2.56 (*)    Hemoglobin 8.9 (*)    HCT 24.9 (*)    MCH 34.8 (*)    RDW 19.5 (*)    All other components within normal limits  COMPREHENSIVE METABOLIC PANEL - Abnormal; Notable for the following:    Potassium 3.2 (*)    Glucose, Bld 128 (*)    Creatinine, Ser 0.33 (*)    Total Bilirubin 2.2 (*)    All other components within normal limits  RETICULOCYTES - Abnormal; Notable for the following:    Retic Ct Pct 9.7 (*)    RBC. 2.56 (*)    Retic Count, Manual 248.3 (*)    All other components within normal limits  HCG, QUANTITATIVE, PREGNANCY  I-STAT TROPOININ, ED    Imaging Review Dg Chest 2  View  09/02/2015  CLINICAL DATA:  Sickle cell crisis. Pain worse last night. Chest pain, shortness of breath. EXAM: CHEST  2 VIEW COMPARISON:  08/21/2015 FINDINGS: Right subclavian Port-A-Cath remains in place, unchanged. Heart and mediastinal contours are within normal limits. No focal opacities or effusions. No acute bony abnormality. IMPRESSION: No active cardiopulmonary disease. Electronically Signed   By: Charlett NoseKevin  Dover M.D.   On: 09/02/2015 15:04   Ct Angio Chest Pe W/cm &/or Wo Cm  09/02/2015  CLINICAL DATA:  Shortness of breath. Diffuse body pain secondary to sickle cell crisis. EXAM: CT ANGIOGRAPHY CHEST WITH CONTRAST TECHNIQUE: Multidetector CT imaging of the chest was performed using the standard protocol during bolus administration of intravenous contrast. Multiplanar CT image reconstructions and  MIPs were obtained to evaluate the vascular anatomy. CONTRAST:  48 cc Isovue 370 COMPARISON:  Chest x-ray dated 09/02/2015 and CT angiogram dated 07/12/2015 and 12/30/2013 FINDINGS: Mediastinum/Lymph Nodes: There is a small chronic pericardial effusion. Overall heart size is normal. RV/ LV ratio is normal. No hilar or mediastinal adenopathy. Incidental note is made of a catheter fragment in the left innominate vein anterior to the aortic arch, unchanged since 12/30/2013. Port-A-Cath appears in good position. No pulmonary emboli. Lungs/Pleura: No acute abnormality. Small focal area of pleural thickening laterally at the right lung base, stable since 12/30/2013. No infiltrates. No effusions. Upper abdomen: Small calcified spleen consistent with auto infarction. Chronic hepatomegaly. Musculoskeletal: No acute abnormality. Sclerosis of the bones consistent with the patient's history of sickle cell disease. Review of the MIP images confirms the above findings. IMPRESSION: 1. No pulmonary emboli or other acute abnormalities. 2. Chronic small pericardial effusion. 3. Catheter fragment in the left innominate vein,  unchanged since 12/30/2013. Electronically Signed   By: Francene BoyersJames  Maxwell M.D.   On: 09/02/2015 16:12   I have personally reviewed and evaluated these images and lab results as part of my medical decision-making.   EKG Interpretation   Date/Time:  Monday September 02 2015 14:06:15 EDT Ventricular Rate:  97 PR Interval:    QRS Duration: 84 QT Interval:  339 QTC Calculation: 431 R Axis:   62 Text Interpretation:  Sinus rhythm RSR' in V1 or V2, right VCD or RVH No  significant change since last tracing Confirmed by ALLEN  MD, ANTHONY  ([id number]) on 09/02/2015 2:18:24 PM      MDM   Final diagnoses:  Sickle-cell disease with pain (HCC)    Patient presents with worsening sickle cell pain and associated chest pain and shortness of breath. HR between 90-115, remaining vital stable. Exam revealed anterior chest wall tender to palpation, remaining exam unremarkable. Lungs clear to auscultation bilaterally. EKG showed sinus rhythm, no significant changes from prior. Troponin negative. Potassium 3.2, patient given by mouth potassium supplement. Remaining labs appear to be at patient's baseline values. Due to patient having chest pain, shortness of breath and tachycardia in the ED will order CT angio chest her further evaluation. CT angio showed no PE or acute abnormalities. I have a low suspicion for ACS, PE, dissection, or other acute cardiac event at this time. Pt given 3 dose of IV dilaudid without relief of pain. Consulted hospitalist. Dr. Butler Denmarkizwan reports that she was not feel that the pt needs admission at this time as she does not appear to be in a sickle cell pain crisis. In addition chart review shows pt to have multiple ED visits and recent admission in May drew concern for pt to be pain seeking for her chronic pain. Pt was scheduled to follow up with her PCP, Dr. Hyman HopesJegede, on 6/27 after being d/c from the hospital which she reports she did not go to her appointment. Pt appears to be seeking treatment with  narcotics for her chronic pain. Pt was given ketamine in the ED to try to help with her chronic pain. On reevaluation, pt is requesting narcotics multiple times. I discussed results with pt and advised her that she needs to follow up with her PCP in order to get continued management of her chronic pain. Advised pt to call her PCP office tomorrow to schedule a follow up appointment. Discussed strict return precautions with pt.     Satira Sarkicole Elizabeth ReisterstownNadeau, [place] JerseyPA-C 09/02/15 2109  Nelva Nayobert Beaton, MD 09/04/15 [phone]

## 2015-09-02 NOTE — ED Notes (Signed)
RN accessing [employer] [facility] [employer]

## 2015-09-02 NOTE — ED Notes (Signed)
Nurse is collecting blood patient has a port

## 2015-09-02 NOTE — ED Notes (Signed)
Pt reports sickle cell crisis pain worse since last night in legs, back, chest, and generalized. Has been nauseated, but no emesis.

## 2015-09-02 NOTE — ED Notes (Signed)
Pt c/o leg, back, and chest pain r/t SCC.  Pt reports pain began last night.  Sts she has been taking all medications as prescribed and has been hydrating.  Sts she typically does not have chest discomfort during crisis.

## 2015-09-04 ENCOUNTER — Telehealth: Payer: Self-pay

## 2015-09-04 NOTE — Telephone Encounter (Signed)
Pt is requesting a medication refill for MS Contin. Thanks!

## 2015-09-05 NOTE — Telephone Encounter (Signed)
Refill request for ms Contin. Please advise. Thanks!

## 2015-09-06 ENCOUNTER — Other Ambulatory Visit: Payer: Self-pay | Admitting: Internal Medicine

## 2015-09-06 ENCOUNTER — Telehealth: Payer: Self-pay

## 2015-09-06 DIAGNOSIS — D571 Sickle-cell disease without crisis: Secondary | ICD-10-CM

## 2015-09-06 MED ORDER — MORPHINE SULFATE ER 30 MG PO TBCR: 30.0000 mg | EXTENDED_RELEASE_TABLET | Freq: Two times a day (BID) | ORAL | Status: DC

## 2015-09-06 NOTE — Telephone Encounter (Signed)
Refill request for oxycodone 10/325mg. Please advise. Thanks!  

## 2015-09-06 NOTE — Telephone Encounter (Signed)
Pt is requesting a medication refill for Oxycodone, 10mg. Thanks! 

## 2015-09-11 HISTORY — PX: OTHER SURGICAL HISTORY: SHX169

## 2015-09-12 ENCOUNTER — Telehealth: Payer: Self-pay

## 2015-09-12 NOTE — Telephone Encounter (Signed)
Refill request for oxycodone. Please advise. Thanks!  

## 2015-09-13 ENCOUNTER — Other Ambulatory Visit: Payer: Self-pay | Admitting: Internal Medicine

## 2015-09-13 ENCOUNTER — Other Ambulatory Visit: Payer: Self-pay | Admitting: Family Medicine

## 2015-09-13 DIAGNOSIS — D571 Sickle-cell disease without crisis: Secondary | ICD-10-CM

## 2015-09-13 MED ORDER — OXYCODONE-ACETAMINOPHEN 10-325 MG PO TABS: 1.0000 | ORAL_TABLET | ORAL | Status: DC | PRN

## 2015-09-16 ENCOUNTER — Telehealth (HOSPITAL_COMMUNITY): Payer: Self-pay | Admitting: *Deleted

## 2015-09-16 ENCOUNTER — Non-Acute Institutional Stay (HOSPITAL_COMMUNITY)
Admission: AD | Admit: 2015-09-16 | Discharge: 2015-09-16 | Disposition: A | Discharge status: Home / Self Care | Payer: Medicaid Other | Source: Ambulatory Visit | Attending: Internal Medicine | Admitting: Internal Medicine

## 2015-09-16 ENCOUNTER — Encounter (HOSPITAL_COMMUNITY): Payer: Self-pay | Admitting: *Deleted

## 2015-09-16 DIAGNOSIS — Z79899 Other long term (current) drug therapy: Secondary | ICD-10-CM | POA: Diagnosis not present

## 2015-09-16 DIAGNOSIS — Z791 Long term (current) use of non-steroidal anti-inflammatories (NSAID): Secondary | ICD-10-CM | POA: Insufficient documentation

## 2015-09-16 DIAGNOSIS — F329 Major depressive disorder, single episode, unspecified: Secondary | ICD-10-CM | POA: Diagnosis not present

## 2015-09-16 DIAGNOSIS — M549 Dorsalgia, unspecified: Secondary | ICD-10-CM | POA: Diagnosis present

## 2015-09-16 DIAGNOSIS — D5700 Hb-SS disease with crisis, unspecified: Secondary | ICD-10-CM | POA: Diagnosis not present

## 2015-09-16 DIAGNOSIS — Z79891 Long term (current) use of opiate analgesic: Secondary | ICD-10-CM | POA: Insufficient documentation

## 2015-09-16 LAB — CBC WITH DIFFERENTIAL/PLATELET
Basophils Absolute: 0 10*3/uL (ref 0.0–0.1)
Basophils Relative: 0 %
EOS PCT: 0 %
Eosinophils Absolute: 0 10*3/uL (ref 0.0–0.7)
HEMATOCRIT: 23.5 % — AB (ref 36.0–46.0)
HEMOGLOBIN: 8.4 g/dL — AB (ref 12.0–15.0)
LYMPHS ABS: 2.5 10*3/uL (ref 0.7–4.0)
LYMPHS PCT: 21 %
MCH: 35.9 pg — ABNORMAL HIGH (ref 26.0–34.0)
MCHC: 35.7 g/dL (ref 30.0–36.0)
MCV: 100.4 fL — AB (ref 78.0–100.0)
MONOS PCT: 7 %
Monocytes Absolute: 0.8 10*3/uL (ref 0.1–1.0)
NEUTROS PCT: 72 %
Neutro Abs: 8.5 10*3/uL — ABNORMAL HIGH (ref 1.7–7.7)
Platelets: 400 10*3/uL (ref 150–400)
RBC: 2.34 MIL/uL — AB (ref 3.87–5.11)
RDW: 22.2 % — ABNORMAL HIGH (ref 11.5–15.5)
WBC: 11.8 10*3/uL — AB (ref 4.0–10.5)

## 2015-09-16 LAB — COMPREHENSIVE METABOLIC PANEL
ALT: 42 U/L (ref 14–54)
AST: 53 U/L — AB (ref 15–41)
Albumin: 4.7 g/dL (ref 3.5–5.0)
Alkaline Phosphatase: 81 U/L (ref 38–126)
Anion gap: 7 (ref 5–15)
BUN: 6 mg/dL (ref 6–20)
CHLORIDE: 109 mmol/L (ref 101–111)
CO2: 21 mmol/L — ABNORMAL LOW (ref 22–32)
Calcium: 9.2 mg/dL (ref 8.9–10.3)
Creatinine, Ser: 0.5 mg/dL (ref 0.44–1.00)
GFR calc Af Amer: >60 mL/min (ref >60)
Glucose, Bld: 102 mg/dL — ABNORMAL HIGH (ref 65–99)
POTASSIUM: 3.2 mmol/L — AB (ref 3.5–5.1)
Sodium: 137 mmol/L (ref 135–145)
Total Bilirubin: 2.4 mg/dL — ABNORMAL HIGH (ref 0.3–1.2)
Total Protein: 7.8 g/dL (ref 6.5–8.1)

## 2015-09-16 LAB — RETICULOCYTES
RBC.: 2.34 MIL/uL — ABNORMAL LOW (ref 3.87–5.11)
RETIC COUNT ABSOLUTE: 454 10*3/uL — AB (ref 19.0–186.0)
Retic Ct Pct: 19.4 % — ABNORMAL HIGH (ref 0.4–3.1)

## 2015-09-16 LAB — URINALYSIS, ROUTINE W REFLEX MICROSCOPIC
Bilirubin Urine: NEGATIVE
Glucose, UA: NEGATIVE mg/dL
Ketones, ur: NEGATIVE mg/dL
NITRITE: POSITIVE — AB
Protein, ur: 30 mg/dL — AB
Specific Gravity, Urine: 1.015 (ref 1.005–1.030)
pH: 6 (ref 5.0–8.0)

## 2015-09-16 LAB — URINE MICROSCOPIC-ADD ON

## 2015-09-16 MED ORDER — DEXTROSE-NACL 5-0.45 % IV SOLN
INTRAVENOUS | Status: DC
  Administered 2015-09-16: 12:00:00 via INTRAVENOUS

## 2015-09-16 MED ORDER — SENNOSIDES-DOCUSATE SODIUM 8.6-50 MG PO TABS: 1.0000 | ORAL_TABLET | Freq: Two times a day (BID) | ORAL | Status: DC

## 2015-09-16 MED ORDER — HYDROMORPHONE 1 MG/ML IV SOLN
INTRAVENOUS | Status: DC
  Administered 2015-09-16: 12:00:00 via INTRAVENOUS
  Administered 2015-09-16: 6.5 mg via INTRAVENOUS
  Administered 2015-09-16: 5 mg via INTRAVENOUS
  Filled 2015-09-16: qty 25

## 2015-09-16 MED ORDER — POLYETHYLENE GLYCOL 3350 17 G PO PACK: 17.0000 g | PACK | Freq: Every day | ORAL | Status: DC | PRN

## 2015-09-16 MED ORDER — ONDANSETRON HCL 4 MG/2ML IJ SOLN
4.0000 mg | Freq: Four times a day (QID) | INTRAMUSCULAR | Status: DC | PRN
Start: 2015-09-16 — End: 2015-09-16
  Administered 2015-09-16: 4 mg via INTRAVENOUS
  Filled 2015-09-16: qty 2

## 2015-09-16 MED ORDER — SODIUM CHLORIDE 0.9% FLUSH: 9.0000 mL | INTRAVENOUS | Status: DC | PRN

## 2015-09-16 MED ORDER — DIPHENHYDRAMINE HCL 25 MG PO CAPS
25.0000 mg | ORAL_CAPSULE | ORAL | Status: DC | PRN
Start: 2015-09-16 — End: 2015-09-16
  Administered 2015-09-16: 25 mg via ORAL
  Filled 2015-09-16: qty 1

## 2015-09-16 MED ORDER — NALOXONE HCL 0.4 MG/ML IJ SOLN: 0.4000 mg | INTRAMUSCULAR | Status: DC | PRN

## 2015-09-16 MED ORDER — KETOROLAC TROMETHAMINE 30 MG/ML IJ SOLN
30.0000 mg | Freq: Four times a day (QID) | INTRAMUSCULAR | Status: DC
  Administered 2015-09-16: 30 mg via INTRAVENOUS
  Filled 2015-09-16: qty 1

## 2015-09-16 MED ORDER — SODIUM CHLORIDE 0.9 % IV SOLN
25.0000 mg | INTRAVENOUS | Status: DC | PRN
  Administered 2015-09-16: 25 mg via INTRAVENOUS
  Filled 2015-09-16 (×3): qty 0.5

## 2015-09-16 NOTE — Progress Notes (Signed)
Admitted to [facility] for treatment of acute pain in legs, hips and back. Pain intensity 8/10. Treated with IV fluids after IV team accessed vein, IV Toradol and IV PCA Dilaudid. Pain level down to 6/7 at time of discharge. Went over discharge instructions and patient ready to be discharged to home. Good spirits. Alert, ambulatory and oriented at time of discharge. Discharged to home with a ride.

## 2015-09-16 NOTE — Discharge Summary (Signed)
Physician Discharge Summary  Brittany Foster ZOX:[id number] DOB: [birth year] Brittany KlompOA: 09/16/2015  PCP: Jeanann Lewandowsky, MD  Admit date: 09/16/2015  Discharge date: 09/16/2015  Time spent: 30 minutes  Discharge Diagnoses:  Active Problems:   Sickle cell anemia with pain [facility])   Discharge Condition: Stable  Diet recommendation: Regular  Filed Weights   09/16/15 1004  Weight: 125 lb (56.7 kg)    History of present illness:   Brittany Foster is a 30 y.o. female with history of sickle cell disease and frequent sickle cell pain crisis with attending numerous healthcare services over-utilization. She presented to the day hospital today with a major complaint of pain in her back, hip joints and legs consistent with her sickle cell pain crisis. Patient is not adherent with her home medications including hydroxyurea, folic acid and even pain medications. Patient claims she does not like the way hydroxyurea makes her feel after taking it, and her pain medications make her drowsy at home and she would not want to be drowsy around her 19-year-old daughter. So she has resorted to depend on EDs and the day hospital for her pain management despite repeated counseling and case management. She rated her pain at 8 out of 10 today, she denies any fever, no urinary symptoms, no headache, no shortness of breath, no chest pain, no nausea, vomiting or diarrhea. No joint swelling or redness.She said she is planning to relocate because everywhere she goes now she is known for being "narcotic seeking", and even her insurance company had called her several times for the same reason.  Hospital Course:   Brittany Foster was admitted to the day hospital with sickle cell painful crisis. Patient was treated with weight based IV Dilaudid PCA, IV Toradol as well as IV fluids. Brittany Foster showed significant improvement symptomatically, pain improved from 8 to 3 out of 10 at the time of discharge. Brittany Foster will follow-up at the clinic as  previously scheduled, continue with home medications as per prior to admission. She was again counseled extensively about adherence with medications, she was advised to attend the vocational rehabilitation already arranged and scheduled by sickle cell agency. She was advised to attempt volunteer services to keep busy and active. When asked what we can do to help her with her medications adherence and sickle cell pain management, she said "nothing, you are already doing the best for me, the problem is for me to get myself together".  Discharge Instructions We discussed the need for good hydration, monitoring of hydration status, avoidance of heat, cold, stress, and infection triggers. We discussed the need to be compliant with taking Hydrea. Brittany Foster was reminded of the need to seek medical attention of any symptoms of bleeding, anemia, or infection occurs.  Discharge Exam: Filed Vitals:   09/16/15 1435 09/16/15 1559  BP: 108/58 96/54  Pulse: 86 96  Temp:    Resp: 13 15    General appearance: alert, cooperative and no distress, poor dentition Eyes: conjunctivae/corneas clear. PERRL, EOM's intact. Fundi benign. Neck: no adenopathy, no carotid bruit, no JVD, supple, symmetrical, trachea midline and thyroid not enlarged, symmetric, no tenderness/mass/nodules Back: symmetric, no curvature. ROM normal. No CVA tenderness. Resp: clear to auscultation bilaterally Chest wall: no tenderness Cardio: regular rate and rhythm, S1, S2 normal, no murmur, click, rub or gallop GI: soft, non-tender; bowel sounds normal; no masses, no organomegaly Extremities: extremities normal, atraumatic, no cyanosis or edema Pulses: 2+ and symmetric Skin: Skin color, texture, turgor normal. No rashes or lesions Neurologic: Grossly normal  Current Discharge Medication List    CONTINUE these medications which have NOT CHANGED   Details  DULoxetine (CYMBALTA) 60 MG capsule Take 1 capsule (60 mg total) by mouth  daily. Qty: 30 capsule, Refills: 3   Associated Diagnoses: Chronic pain syndrome    folic acid (FOLVITE) 1 MG tablet Take 1 tablet (1 mg total) by mouth daily. 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 MAY TAKE WITH FOOD TO MINIMIZE GI SIDE EFFECTS. Qty: 60 capsule, Refills: 3   Associated Diagnoses: Hb-SS disease without crisis (HCC)    ibuprofen (ADVIL,MOTRIN) 200 MG tablet Take 400 mg by mouth every 6 (six) hours as needed for moderate pain.     morphine (MS CONTIN) 30 MG 12 hr tablet Take 1 tablet (30 mg total) by mouth every 12 (twelve) hours. Qty: 30 tablet, Refills: 0   Associated Diagnoses: Hb-SS disease without crisis (HCC)    oxyCODONE-acetaminophen (PERCOCET) 10-325 MG tablet Take 1 tablet by mouth every 4 (four) hours as needed for pain. Qty: 90 tablet, Refills: 0   Associated Diagnoses: Hb-SS disease without crisis (HCC)    Topiramate ER 100 MG CP24 Take 100 mg by mouth at bedtime.       Allergies  Allergen Reactions  . Ultram [Tramadol] Other (See Comments)    seizures  . Zofran [Ondansetron Hcl] Nausea And Vomiting  . Buprenorphine Hcl Hives and Rash    Shaking Tolerates Percocet, Norco, and buprenorphine  . Morphine And Related Hives, Rash and Other (See Comments)    Shaking Tolerates Percocet, Norco, Dilaudid, and buprenorphine  . Tape Rash     Significant Diagnostic Studies: Dg Chest 2 View  09/02/2015  CLINICAL DATA:  Sickle cell crisis. Pain worse last night. Chest pain, shortness of breath. EXAM: CHEST  2 VIEW COMPARISON:  08/21/2015 FINDINGS: Right subclavian Port-A-Cath remains in place, unchanged. Heart and mediastinal contours are within normal limits. No focal opacities or effusions. No acute bony abnormality. IMPRESSION: No active cardiopulmonary disease. Electronically Signed   By: Charlett Nose M.D.   On: 09/02/2015 15:04   Dg  Chest 2 View  08/21/2015  CLINICAL DATA:  Sickle cell pain in the chest, hips and legs today. Shortness of breath with dizziness. EXAM: CHEST  2 VIEW COMPARISON:  08/12/2015 radiographs. FINDINGS: The heart size and mediastinal contours are stable. The right subclavian Port-A-Cath appears unchanged at the SVC right atrial level. There is a probable catheter remnant in the left brachiocephalic vein which is also stable. The lungs are clear. There is no pleural effusion or pneumothorax. No acute osseous findings are seen. IMPRESSION: Stable chest.  No acute cardiopulmonary process. Electronically Signed   By: Carey Bullocks M.D.   On: 08/21/2015 14:19   Ct Angio Chest Pe W/cm &/or Wo Cm  09/02/2015  CLINICAL DATA:  Shortness of breath. Diffuse body pain secondary to sickle cell crisis. EXAM: CT ANGIOGRAPHY CHEST WITH CONTRAST TECHNIQUE: Multidetector CT imaging of the chest was performed using the standard protocol during bolus administration of intravenous contrast. Multiplanar CT image reconstructions and MIPs were obtained to evaluate the vascular anatomy. CONTRAST:  48 cc Isovue 370 COMPARISON:  Chest x-ray dated 09/02/2015 and CT angiogram dated 07/12/2015 and 12/30/2013 FINDINGS: Mediastinum/Lymph Nodes: There is a small chronic pericardial effusion. Overall heart size is normal. RV/ LV ratio is normal. No hilar or mediastinal adenopathy. Incidental note is made  of a catheter fragment in the left innominate vein anterior to the aortic arch, unchanged since 12/30/2013. Port-A-Cath appears in good position. No pulmonary emboli. Lungs/Pleura: No acute abnormality. Small focal area of pleural thickening laterally at the right lung base, stable since 12/30/2013. No infiltrates. No effusions. Upper abdomen: Small calcified spleen consistent with auto infarction. Chronic hepatomegaly. Musculoskeletal: No acute abnormality. Sclerosis of the bones consistent with the patient's history of sickle cell disease. Review  of the MIP images confirms the above findings. IMPRESSION: 1. No pulmonary emboli or other acute abnormalities. 2. Chronic small pericardial effusion. 3. Catheter fragment in the left innominate vein, unchanged since 12/30/2013. Electronically Signed   By: Francene BoyersJames  Maxwell M.D.   On: 09/02/2015 16:12    Signed:  Jeanann LewandowskyJEGEDE, Ethelwyn Gilbertson MD, MHA, FACP, FAAP, CPE   09/16/2015, 4:33 PM

## 2015-09-16 NOTE — Telephone Encounter (Signed)
Called in complaining about 8/10 pain in bilateral legs, hip and back. Denies fever, chest pain,  N/V, diarrhea or abdominal pain. Took Oxycodone and MS Contin this morning at 6 am without relief.

## 2015-09-16 NOTE — H&P (Signed)
[facility] History and Physical  Brittany Foster ZOX:[id number] DOB: 09/18/1985 DOA: 09/16/2015  PCP: Jeanann Lewandowsky, MD   Chief Complaint: No chief complaint on file.   HPI: Brittany Foster is a 30 y.o. female with history of sickle cell disease and frequent sickle cell pain crisis with attending numerous healthcare services over-utilization. She presented to the day hospital today with a major complaint of pain in her back, hip joints and legs consistent with her sickle cell pain crisis. Patient is not adherent with her home medications including hydroxyurea, folic acid and even pain medications. Patient claims she does not like the way hydroxyurea makes her feel after taking it, and her pain medications make her drowsy at home and she would not want to be drowsy around her 57-year-old daughter. So she has resorted to depend on EDs and the day hospital for her pain management despite repeated counseling and case management. She rated her pain at 8 out of 10 today, she denies any fever, no urinary symptoms, no headache, no shortness of breath, no chest pain, no nausea, vomiting or diarrhea. No joint swelling or redness.  Systemic Review: General: The patient denies anorexia, fever, weight loss Cardiac: Denies chest pain, syncope, palpitations, pedal edema  Respiratory: Denies cough, shortness of breath, wheezing GI: Denies severe indigestion/heartburn, abdominal pain, nausea, vomiting, diarrhea and constipation GU: Denies hematuria, incontinence, dysuria  Skin: Denies suspicious skin lesions Neurologic: Denies focal weakness or numbness, change in vision  Past Medical History  Diagnosis Date  . Sickle cell anemia (HCC)   . Anemia   . Migraines   . Depression, major, recurrent [facility])     Past Surgical History  Procedure Laterality Date  . Tubal ligation    . Cesarean section    . Port a cath placement Right     about 6-7 years ago  . Cholecystectomy  2000  . Multiple  tooth extractions N/A   . Removal of porta cath Right 09/11/15    Allergies  Allergen Reactions  . Ultram [Tramadol] Other (See Comments)    seizures  . Zofran [Ondansetron Hcl] Nausea And Vomiting  . Buprenorphine Hcl Hives and Rash    Shaking Tolerates Percocet, Norco, and buprenorphine  . Morphine And Related Hives, Rash and Other (See Comments)    Shaking Tolerates Percocet, Norco, Dilaudid, and buprenorphine  . Tape Rash    Family History  Problem Relation Age of Onset  . Sickle cell anemia Other   . Sickle cell trait Father   . Sickle cell trait Mother       Prior to Admission medications   Medication Sig Start Date End Date Taking? Authorizing Provider  DULoxetine (CYMBALTA) 60 MG capsule Take 1 capsule (60 mg total) by mouth daily. 08/08/15  Yes [name], FNP  folic acid (FOLVITE) 1 MG tablet Take 1 tablet (1 mg total) by mouth daily. 08/08/15  Yes [name], FNP  hydroxyurea (HYDREA) 500 MG capsule TAKE 2 CAPSULES BY MOUTH DAILY. MAY TAKE WITH FOOD TO MINIMIZE GI SIDE EFFECTS. 08/08/15  Yes [name], FNP  ibuprofen (ADVIL,MOTRIN) 200 MG tablet Take 400 mg by mouth every 6 (six) hours as needed for moderate pain.    Yes Historical Provider, MD  morphine (MS CONTIN) 30 MG 12 hr tablet Take 1 tablet (30 mg total) by mouth every 12 (twelve) hours. 09/06/15  Yes Quentin Angst, MD  oxyCODONE-acetaminophen (PERCOCET) 10-325 MG tablet Take 1 tablet by mouth every 4 (four)  hours as needed for pain. 09/13/15  Yes Henrietta HooverLinda C Bernhardt, NP  Topiramate ER 100 MG CP24 Take 100 mg by mouth at bedtime.   Yes Historical Provider, MD     Physical Exam: Filed Vitals:   09/16/15 1004  BP: 96/61  Pulse: 94  Temp: 99 F (37.2 C)  TempSrc: Oral  Resp: 20  Height: 5' (1.524 m)  Weight: 125 lb (56.7 kg)  SpO2: 100%    General: Alert, awake, afebrile, anicteric, not in obvious distress HEENT: Normocephalic and Atraumatic, Mucous membranes pink                 PERRLA; EOM intact; No scleral icterus,                 Nares: Patent, Oropharynx: Clear, Fair Dentition                 Neck: FROM, no cervical lymphadenopathy, thyromegaly, carotid bruit or JVD;  CHEST WALL: No tenderness  CHEST: Normal respiration, clear to auscultation bilaterally  HEART: Regular rate and rhythm; no murmurs rubs or gallops  BACK: No kyphosis or scoliosis; no CVA tenderness  ABDOMEN: Positive Bowel Sounds, soft, non-tender; no masses, no organomegaly EXTREMITIES: No cyanosis, clubbing, or edema SKIN:  no rash or ulceration  CNS: Alert and Oriented x 4, Nonfocal exam, CN 2-12 intact  Labs on Admission:  Basic Metabolic Panel: No results for input(s): NA, K, CL, CO2, GLUCOSE, BUN, CREATININE, CALCIUM, MG, PHOS in the last 168 hours. Liver Function Tests: No results for input(s): AST, ALT, ALKPHOS, BILITOT, PROT, ALBUMIN in the last 168 hours. No results for input(s): LIPASE, AMYLASE in the last 168 hours. No results for input(s): AMMONIA in the last 168 hours. CBC: No results for input(s): WBC, NEUTROABS, HGB, HCT, MCV, PLT in the last 168 hours. Cardiac Enzymes: No results for input(s): CKTOTAL, CKMB, CKMBINDEX, TROPONINI in the last 168 hours.  BNP (last 3 results) No results for input(s): BNP in the last 8760 hours.  ProBNP (last 3 results) No results for input(s): PROBNP in the last 8760 hours.  CBG: No results for input(s): GLUCAP in the last 168 hours.   Assessment/Plan Active Problems:   Sickle cell anemia with pain (HCC)   Admits to the Day Hospital  IVF D5 .45% Saline @ 125 mls/hour  Weight based Dilaudid PCA started within 30 minutes of admission  IV Toradol 30 mg Q 6 H  Monitor vitals very closely, Re-evaluate pain scale every hour  2 L of Oxygen by [place]  Patient will be re-evaluated for pain in the context of function and relationship to baseline as care progresses.  If no significant relieve from pain (remains above 5/10) will transfer  patient to inpatient services for further evaluation and management  Code Status: Full  Family Communication: None  DVT Prophylaxis: Ambulate as tolerated   Time spent: 35 Minutes  Armarion Greek, MD, MHA, FACP, FAAP, CPE  If 7PM-7AM, please contact night-coverage [email or web address] 09/16/2015, 10:24 AM

## 2015-09-16 NOTE — Discharge Instructions (Signed)
Sickle Cell Anemia, Adult Sickle cell anemia is a condition in which red blood cells have an abnormal "sickle" shape. This abnormal shape shortens the cells' life span, which results in a lower than normal concentration of red blood cells in the blood. The sickle shape also causes the cells to clump together and block free blood flow through the blood vessels. As a result, the tissues and organs of the body do not receive enough oxygen. Sickle cell anemia causes organ damage and pain and increases the risk of infection. CAUSES  Sickle cell anemia is a genetic disorder. Those who receive two copies of the gene have the condition, and those who receive one copy have the trait. RISK FACTORS The sickle cell gene is most common in people whose families originated in [place]. Other areas of the globe where sickle cell trait occurs include the Mediterranean, [place] and [place], the [place], and the [place].  SIGNS AND SYMPTOMS  Pain, especially in the extremities, back, chest, or abdomen (common). The pain may start suddenly or may develop following an illness, especially if there is dehydration. Pain can also occur due to overexertion or exposure to extreme temperature changes.  Frequent severe bacterial infections, especially certain types of pneumonia and meningitis.  Pain and swelling in the hands and feet.  Decreased activity.   Loss of appetite.   Change in behavior.  Headaches.  Seizures.  Shortness of breath or difficulty breathing.  Vision changes.  Skin ulcers. Those with the trait may not have symptoms or they may have mild symptoms.  DIAGNOSIS  Sickle cell anemia is diagnosed with blood tests that demonstrate the genetic trait. It is often diagnosed during the newborn period, due to mandatory testing nationwide. A variety of blood tests, X-rays, CT scans, MRI scans, ultrasounds, and lung function tests may also be done to monitor the condition. TREATMENT  Sickle  cell anemia may be treated with:  Medicines. You may be given pain medicines, antibiotic medicines (to treat and prevent infections) or medicines to increase the production of certain types of hemoglobin.  Fluids.  Oxygen.  Blood transfusions. HOME CARE INSTRUCTIONS   Drink enough fluid to keep your urine clear or pale yellow. Increase your fluid intake in hot weather and during exercise.  Do not smoke. Smoking lowers oxygen levels in the blood.   Only take over-the-counter or prescription medicines for pain, fever, or discomfort as directed by your health care provider.  Take antibiotics as directed by your health care provider. Make sure you finish them it even if you start to feel better.   Take supplements as directed by your health care provider.   Consider wearing a medical alert bracelet. This tells anyone caring for you in an emergency of your condition.   When traveling, keep your medical information, health care provider's names, and the medicines you take with you at all times.   If you develop a fever, do not take medicines to reduce the fever right away. This could cover up a problem that is developing. Notify your health care provider.  Keep all follow-up appointments with your health care provider. Sickle cell anemia requires regular medical care. SEEK MEDICAL CARE IF: You have a fever. SEEK IMMEDIATE MEDICAL CARE IF:   You feel dizzy or faint.   You have new abdominal pain, especially on the left side near the stomach area.   You develop a persistent, often uncomfortable and painful penile erection (priapism). If this is not treated immediately it   will lead to impotence.   You have numbness your arms or legs or you have a hard time moving them.   You have a hard time with speech.   You have a fever or persistent symptoms for more than 2-3 days.   You have a fever and your symptoms suddenly get worse.   You have signs or symptoms of infection.  These include:   Chills.   Abnormal tiredness (lethargy).   Irritability.   Poor eating.   Vomiting.   You develop pain that is not helped with medicine.   You develop shortness of breath.  You have pain in your chest.   You are coughing up pus-like or bloody sputum.   You develop a stiff neck.  Your feet or hands swell or have pain.  Your abdomen appears bloated.  You develop joint pain. MAKE SURE YOU:  Understand these instructions.   This information is not intended to replace advice given to you by your health care provider. Make sure you discuss any questions you have with your health care provider.   Document Released: 05/27/2005 Document Revised: 03/09/2014 Document Reviewed: 09/28/2012 Elsevier Interactive Patient Education 2016 [employer]  

## 2015-09-16 NOTE — Telephone Encounter (Signed)
Talked to Dr. Hyman HopesJegede about patient's complaints of pain and he advises for her to come in for treatment. Patient notified and she will come in from home. She said "just to let you know I don't have my port any longer, they took it out."

## 2015-09-17 ENCOUNTER — Ambulatory Visit: Payer: Medicaid Other | Admitting: Internal Medicine

## 2015-09-20 ENCOUNTER — Telehealth (HOSPITAL_COMMUNITY): Payer: Self-pay | Admitting: Internal Medicine

## 2015-09-20 NOTE — Telephone Encounter (Signed)
Pt called and states experiencing pain in legs, back, and hip; states took home pain medications at 7am; pt denies nausea, cp, shortness of breath, diarrhea; Notified MD; pt notified to increase fluid intake and take home medications as prescribed; pt notified to go to the ED if symptoms of chest pain or shortness of breath occur; pt verbalizes understanding

## 2015-09-23 ENCOUNTER — Telehealth (HOSPITAL_COMMUNITY): Payer: Self-pay | Admitting: Hematology

## 2015-09-23 NOTE — Telephone Encounter (Signed)
Spoke with Dr. Hyman Hopes and it is ok for patient to come to [facility].  Patient verbalizes understanding.

## 2015-09-23 NOTE — Telephone Encounter (Signed)
Patient C/O pain to legs and back.  Patient rates pain 7/10 on pain scale.  Patient denies Chest pain, shortness of breath, or difficulty breathing.  Patient denies N/V/D or abdominal pain.  Patient last took oxycodone and MS contin around 0500.  I explained that I would notify the provider and give her a call back.  Patient verbalizes understanding.

## 2015-09-24 ENCOUNTER — Telehealth (HOSPITAL_COMMUNITY): Payer: Self-pay | Admitting: *Deleted

## 2015-09-24 ENCOUNTER — Other Ambulatory Visit: Payer: Self-pay | Admitting: Internal Medicine

## 2015-09-24 ENCOUNTER — Non-Acute Institutional Stay (HOSPITAL_COMMUNITY)
Admission: AD | Admit: 2015-09-24 | Discharge: 2015-09-24 | Disposition: A | Discharge status: Home / Self Care | Payer: Medicaid Other | Source: Ambulatory Visit | Attending: Internal Medicine | Admitting: Internal Medicine

## 2015-09-24 ENCOUNTER — Encounter (HOSPITAL_COMMUNITY): Payer: Self-pay | Admitting: *Deleted

## 2015-09-24 DIAGNOSIS — M549 Dorsalgia, unspecified: Secondary | ICD-10-CM | POA: Diagnosis present

## 2015-09-24 DIAGNOSIS — Z79899 Other long term (current) drug therapy: Secondary | ICD-10-CM | POA: Diagnosis not present

## 2015-09-24 DIAGNOSIS — D5700 Hb-SS disease with crisis, unspecified: Secondary | ICD-10-CM | POA: Insufficient documentation

## 2015-09-24 DIAGNOSIS — F329 Major depressive disorder, single episode, unspecified: Secondary | ICD-10-CM | POA: Insufficient documentation

## 2015-09-24 DIAGNOSIS — D571 Sickle-cell disease without crisis: Secondary | ICD-10-CM

## 2015-09-24 LAB — CBC WITH DIFFERENTIAL/PLATELET
Basophils Absolute: 0.1 10*3/uL (ref 0.0–0.1)
Basophils Relative: 0 %
Eosinophils Absolute: 0 10*3/uL (ref 0.0–0.7)
Eosinophils Relative: 0 %
HEMATOCRIT: 25.8 % — AB (ref 36.0–46.0)
HEMOGLOBIN: 9 g/dL — AB (ref 12.0–15.0)
LYMPHS ABS: 3.3 10*3/uL (ref 0.7–4.0)
LYMPHS PCT: 27 %
MCH: 35 pg — AB (ref 26.0–34.0)
MCHC: 34.9 g/dL (ref 30.0–36.0)
MCV: 100.4 fL — AB (ref 78.0–100.0)
MONO ABS: 1.2 10*3/uL — AB (ref 0.1–1.0)
MONOS PCT: 10 %
NEUTROS ABS: 7.4 10*3/uL (ref 1.7–7.7)
NEUTROS PCT: 62 %
Platelets: 370 10*3/uL (ref 150–400)
RBC: 2.57 MIL/uL — ABNORMAL LOW (ref 3.87–5.11)
RDW: 19.5 % — AB (ref 11.5–15.5)
WBC: 12 10*3/uL — ABNORMAL HIGH (ref 4.0–10.5)

## 2015-09-24 LAB — RETICULOCYTES
RBC.: 2.57 MIL/uL — ABNORMAL LOW (ref 3.87–5.11)
Retic Count, Absolute: 498.6 10*3/uL — ABNORMAL HIGH (ref 19.0–186.0)
Retic Ct Pct: 19.4 % — ABNORMAL HIGH (ref 0.4–3.1)

## 2015-09-24 MED ORDER — SENNOSIDES-DOCUSATE SODIUM 8.6-50 MG PO TABS
1.0000 | ORAL_TABLET | Freq: Two times a day (BID) | ORAL | Status: DC
  Filled 2015-09-24: qty 1

## 2015-09-24 MED ORDER — DIPHENHYDRAMINE HCL 25 MG PO CAPS
25.0000 mg | ORAL_CAPSULE | ORAL | Status: DC | PRN
  Administered 2015-09-24: 25 mg via ORAL
  Filled 2015-09-24: qty 1

## 2015-09-24 MED ORDER — ONDANSETRON HCL 4 MG/2ML IJ SOLN: 4.0000 mg | Freq: Four times a day (QID) | INTRAMUSCULAR | Status: DC | PRN

## 2015-09-24 MED ORDER — SODIUM CHLORIDE 0.9 % IV SOLN
25.0000 mg | INTRAVENOUS | Status: DC | PRN
  Administered 2015-09-24: 25 mg via INTRAVENOUS
  Filled 2015-09-24 (×3): qty 0.5

## 2015-09-24 MED ORDER — OXYCODONE-ACETAMINOPHEN 10-325 MG PO TABS: 1.0000 | ORAL_TABLET | ORAL | 0 refills | Status: DC | PRN

## 2015-09-24 MED ORDER — POLYETHYLENE GLYCOL 3350 17 G PO PACK
17.0000 g | PACK | Freq: Every day | ORAL | Status: DC | PRN
  Filled 2015-09-24: qty 1

## 2015-09-24 MED ORDER — NALOXONE HCL 0.4 MG/ML IJ SOLN: 0.4000 mg | INTRAMUSCULAR | Status: DC | PRN

## 2015-09-24 MED ORDER — SODIUM CHLORIDE 0.9% FLUSH: 9.0000 mL | INTRAVENOUS | Status: DC | PRN

## 2015-09-24 MED ORDER — HYDROMORPHONE 1 MG/ML IV SOLN
INTRAVENOUS | Status: DC
  Administered 2015-09-24: 4 mg via INTRAVENOUS
  Administered 2015-09-24: 11:00:00 via INTRAVENOUS
  Administered 2015-09-24: 9 mg via INTRAVENOUS
  Administered 2015-09-24: 2 mg via INTRAVENOUS
  Administered 2015-09-24: 3 mg via INTRAVENOUS
  Filled 2015-09-24: qty 25

## 2015-09-24 MED ORDER — DEXTROSE-NACL 5-0.45 % IV SOLN
INTRAVENOUS | Status: DC
  Administered 2015-09-24: 11:00:00 via INTRAVENOUS

## 2015-09-24 MED ORDER — KETOROLAC TROMETHAMINE 30 MG/ML IJ SOLN
30.0000 mg | Freq: Four times a day (QID) | INTRAMUSCULAR | Status: DC
  Administered 2015-09-24: 30 mg via INTRAVENOUS
  Filled 2015-09-24: qty 1

## 2015-09-24 NOTE — Discharge Summary (Signed)
Physician Discharge Summary  Brittany Foster DUK:[id number] DOB: 1985-11-25 DOA: 09/24/2015  PCP: Jeanann Lewandowsky, MD  Admit date: 09/24/2015  Discharge date: 09/24/2015  Time spent: 30 minutes  Discharge Diagnoses:  Active Problems:   Sickle cell anemia with pain [facility])   Discharge Condition: Stable  Diet recommendation: Regular  Filed Weights   09/24/15 0908  Weight: 125 lb (56.7 kg)    History of present illness:  Brittany Foster is a 30 y.o. female with history of sickle cell disease. She presented to the day hospital today with a major complaint of pain in her back, hip joints and legs consistent with her sickle cell pain crisis.Took oxycodone 10 mg and MS contin at 5 am this morning without relief. She stated "I was suppose to come yesterday but I couldn't find a ride". Patient is not adherent with her home medications including hydroxyurea, folic acid and even pain medications. She rated her pain at 8 out of 10 today, she denies any fever, no urinary symptoms, no headache, no shortness of breath, no chest pain, no nausea, vomiting or diarrhea. No joint swelling or redness.  Hospital Course:  Nita Letsche was admitted to the day hospital with sickle cell painful crisis. Patient was treated with weight based IV Dilaudid PCA, IV Toradol as well as IV fluids. It was difficult to secure an IV access for Ayani, IV team got one which was lost after about 2 hours. After several trials, another site was secured.  Destany showed improvement symptomatically, pain improved from 8 to 5 out of 10 at the time of discharge. Lue will follow-up at the clinic as previously scheduled, continue with home medications as per prior to admission. She was given a prescription refill for her Oxycodone to be filled on Friday 09/27/2015.   Discharge Instructions We discussed the need for good hydration, monitoring of hydration status, avoidance of heat, cold, stress, and infection triggers. We discussed  the need to be compliant with taking Hydrea. Claudean was reminded of the need to seek medical attention of any symptoms of bleeding, anemia, or infection occurs.  Discharge Exam: Vitals:   09/24/15 1558 09/24/15 1603  BP:  92/60  Pulse:  76  Resp: 14 15  Temp:      General appearance: alert, cooperative and no distress Eyes: conjunctivae/corneas clear. PERRL, EOM's intact. Fundi benign. Neck: no adenopathy, no carotid bruit, no JVD, supple, symmetrical, trachea midline and thyroid not enlarged, symmetric, no tenderness/mass/nodules Back: symmetric, no curvature. ROM normal. No CVA tenderness. Resp: clear to auscultation bilaterally Chest wall: no tenderness Cardio: regular rate and rhythm, S1, S2 normal, no murmur, click, rub or gallop GI: soft, non-tender; bowel sounds normal; no masses, no organomegaly Extremities: extremities normal, atraumatic, no cyanosis or edema Pulses: 2+ and symmetric Skin: Skin color, texture, turgor normal. No rashes or lesions Neurologic: Grossly normal  Current Discharge Medication List    CONTINUE these medications which have NOT CHANGED   Details  DULoxetine (CYMBALTA) 60 MG capsule Take 1 capsule (60 mg total) by mouth daily. Qty: 30 capsule, Refills: 3   Associated Diagnoses: Chronic pain syndrome    folic acid (FOLVITE) 1 MG tablet Take 1 tablet (1 mg total) by mouth daily. 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 MAY TAKE WITH FOOD TO MINIMIZE GI SIDE EFFECTS. Qty: 60 capsule, Refills: 3   Associated Diagnoses: Hb-SS disease without  crisis (HCC)    ibuprofen (ADVIL,MOTRIN) 200 MG tablet Take 400 mg by mouth every 6 (six) hours as needed for moderate pain.     morphine (MS CONTIN) 30 MG 12 hr tablet Take 1 tablet (30 mg total) by mouth every 12 (twelve) hours. Qty: 30 tablet, Refills: 0   Associated Diagnoses: Hb-SS  disease without crisis (HCC)    Topiramate ER 100 MG CP24 Take 100 mg by mouth at bedtime.    oxyCODONE-acetaminophen (PERCOCET) 10-325 MG tablet Take 1 tablet by mouth every 4 (four) hours as needed for pain. Qty: 90 tablet, Refills: 0   Associated Diagnoses: Hb-SS disease without crisis (HCC)       Allergies  Allergen Reactions  . Ultram [Tramadol] Other (See Comments)    seizures  . Zofran [Ondansetron Hcl] Nausea And Vomiting  . Buprenorphine Hcl Hives and Rash    Shaking Tolerates Percocet, Norco, and buprenorphine  . Morphine And Related Hives, Rash and Other (See Comments)    Shaking Tolerates Percocet, Norco, Dilaudid, and buprenorphine  . Tape Rash     Significant Diagnostic Studies: Dg Chest 2 View  Result Date: 09/02/2015 CLINICAL DATA:  Sickle cell crisis. Pain worse last night. Chest pain, shortness of breath. EXAM: CHEST  2 VIEW COMPARISON:  08/21/2015 FINDINGS: Right subclavian Port-A-Cath remains in place, unchanged. Heart and mediastinal contours are within normal limits. No focal opacities or effusions. No acute bony abnormality. IMPRESSION: No active cardiopulmonary disease. Electronically Signed   By: Charlett Nose M.D.   On: 09/02/2015 15:04   Ct Angio Chest Pe W/cm &/or Wo Cm  Result Date: 09/02/2015 CLINICAL DATA:  Shortness of breath. Diffuse body pain secondary to sickle cell crisis. EXAM: CT ANGIOGRAPHY CHEST WITH CONTRAST TECHNIQUE: Multidetector CT imaging of the chest was performed using the standard protocol during bolus administration of intravenous contrast. Multiplanar CT image reconstructions and MIPs were obtained to evaluate the vascular anatomy. CONTRAST:  48 cc Isovue 370 COMPARISON:  Chest x-ray dated 09/02/2015 and CT angiogram dated 07/12/2015 and 12/30/2013 FINDINGS: Mediastinum/Lymph Nodes: There is a small chronic pericardial effusion. Overall heart size is normal. RV/ LV ratio is normal. No hilar or mediastinal adenopathy. Incidental note is  made of a catheter fragment in the left innominate vein anterior to the aortic arch, unchanged since 12/30/2013. Port-A-Cath appears in good position. No pulmonary emboli. Lungs/Pleura: No acute abnormality. Small focal area of pleural thickening laterally at the right lung base, stable since 12/30/2013. No infiltrates. No effusions. Upper abdomen: Small calcified spleen consistent with auto infarction. Chronic hepatomegaly. Musculoskeletal: No acute abnormality. Sclerosis of the bones consistent with the patient's history of sickle cell disease. Review of the MIP images confirms the above findings. IMPRESSION: 1. No pulmonary emboli or other acute abnormalities. 2. Chronic small pericardial effusion. 3. Catheter fragment in the left innominate vein, unchanged since 12/30/2013. Electronically Signed   By: Francene Boyers M.D.   On: 09/02/2015 16:12    Signed:  Jeanann Lewandowsky MD, MHA, FACP, FAAP, CPE   09/24/2015, 4:52 PM

## 2015-09-24 NOTE — Telephone Encounter (Signed)
Returned call to patient by Lanae Boast RN after speaking with Dr. Hyman Hopes about current pain. Advised for patient to come to [facility] for treatment.

## 2015-09-24 NOTE — Progress Notes (Signed)
Patient ID: Brittany Foster, female   DOB: 1985/05/22, 30 y.o.   MRN: [id number] Admitted to [facility] for acute pain in bilateral hips, legs and middle back. Rates pain intensity 8/10. Treated with IV fluids, IV Toradol, PCA Dilaudid. Pain rating down to 7/10. Went over discharge instructions and understanding noted and copy given to patient. Alert, oriented and ambulatory at time of discharge. Patient called her ride to pick her up at the door.

## 2015-09-24 NOTE — Discharge Instructions (Signed)
Sickle Cell Anemia, Adult Sickle cell anemia is a condition in which red blood cells have an abnormal "sickle" shape. This abnormal shape shortens the cells' life span, which results in a lower than normal concentration of red blood cells in the blood. The sickle shape also causes the cells to clump together and block free blood flow through the blood vessels. As a result, the tissues and organs of the body do not receive enough oxygen. Sickle cell anemia causes organ damage and pain and increases the risk of infection. CAUSES  Sickle cell anemia is a genetic disorder. Those who receive two copies of the gene have the condition, and those who receive one copy have the trait. RISK FACTORS The sickle cell gene is most common in people whose families originated in [place]. Other areas of the globe where sickle cell trait occurs include the Mediterranean, [place] and [place], the [place], and the [place].  SIGNS AND SYMPTOMS  Pain, especially in the extremities, back, chest, or abdomen (common). The pain may start suddenly or may develop following an illness, especially if there is dehydration. Pain can also occur due to overexertion or exposure to extreme temperature changes.  Frequent severe bacterial infections, especially certain types of pneumonia and meningitis.  Pain and swelling in the hands and feet.  Decreased activity.   Loss of appetite.   Change in behavior.  Headaches.  Seizures.  Shortness of breath or difficulty breathing.  Vision changes.  Skin ulcers. Those with the trait may not have symptoms or they may have mild symptoms.  DIAGNOSIS  Sickle cell anemia is diagnosed with blood tests that demonstrate the genetic trait. It is often diagnosed during the newborn period, due to mandatory testing nationwide. A variety of blood tests, X-rays, CT scans, MRI scans, ultrasounds, and lung function tests may also be done to monitor the condition. TREATMENT  Sickle  cell anemia may be treated with:  Medicines. You may be given pain medicines, antibiotic medicines (to treat and prevent infections) or medicines to increase the production of certain types of hemoglobin.  Fluids.  Oxygen.  Blood transfusions. HOME CARE INSTRUCTIONS   Drink enough fluid to keep your urine clear or pale yellow. Increase your fluid intake in hot weather and during exercise.  Do not smoke. Smoking lowers oxygen levels in the blood.   Only take over-the-counter or prescription medicines for pain, fever, or discomfort as directed by your health care provider.  Take antibiotics as directed by your health care provider. Make sure you finish them it even if you start to feel better.   Take supplements as directed by your health care provider.   Consider wearing a medical alert bracelet. This tells anyone caring for you in an emergency of your condition.   When traveling, keep your medical information, health care provider's names, and the medicines you take with you at all times.   If you develop a fever, do not take medicines to reduce the fever right away. This could cover up a problem that is developing. Notify your health care provider.  Keep all follow-up appointments with your health care provider. Sickle cell anemia requires regular medical care. SEEK MEDICAL CARE IF: You have a fever. SEEK IMMEDIATE MEDICAL CARE IF:   You feel dizzy or faint.   You have new abdominal pain, especially on the left side near the stomach area.   You develop a persistent, often uncomfortable and painful penile erection (priapism). If this is not treated immediately it   will lead to impotence.   You have numbness your arms or legs or you have a hard time moving them.   You have a hard time with speech.   You have a fever or persistent symptoms for more than 2-3 days.   You have a fever and your symptoms suddenly get worse.   You have signs or symptoms of infection.  These include:   Chills.   Abnormal tiredness (lethargy).   Irritability.   Poor eating.   Vomiting.   You develop pain that is not helped with medicine.   You develop shortness of breath.  You have pain in your chest.   You are coughing up pus-like or bloody sputum.   You develop a stiff neck.  Your feet or hands swell or have pain.  Your abdomen appears bloated.  You develop joint pain. MAKE SURE YOU:  Understand these instructions.   This information is not intended to replace advice given to you by your health care provider. Make sure you discuss any questions you have with your health care provider.   Document Released: 05/27/2005 Document Revised: 03/09/2014 Document Reviewed: 09/28/2012 Elsevier Interactive Patient Education 2016 [employer]  

## 2015-09-24 NOTE — Progress Notes (Signed)
IV in right arm infiltrated. DC'd. Order obtained for IV in foot after attempting to restart in either arm without success. Explained to patient and consent obtained.

## 2015-09-24 NOTE — Telephone Encounter (Signed)
Called complaining of pain 8/10 in both legs, back and both hips. Denies fever, chest pain, diarrhea, abdominal pain or vomiting. Some nausea noted per patient. Took oxycodone 10 mg and MS contin at 5 am this morning without relief. Stated "I was suppose to come yesterday but I couldn't find a ride".

## 2015-09-24 NOTE — H&P (Signed)
[facility] History and Physical  Brittany Foster ZOX:[id number] DOB: [birth year] DOA: 09/24/2015  PCP: Jeanann Lewandowsky, MD   Chief Complaint: No chief complaint on file.   HPI: Brittany Foster is a 30 y.o. female with history of sickle cell disease. She presented to the day hospital today with a major complaint of pain in her back, hip joints and legs consistent with her sickle cell pain crisis.Took oxycodone 10 mg and MS contin at 5 am this morning without relief. She stated "I was suppose to come yesterday but I couldn't find a ride". Patient is not adherent with her home medications including hydroxyurea, folic acid and even pain medications. She rated her pain at 8 out of 10 today, she denies any fever, no urinary symptoms, no headache, no shortness of breath, no chest pain, no nausea, vomiting or diarrhea. No joint swelling or redness.  Systemic Review: General: The patient denies anorexia, fever, weight loss Cardiac: Denies chest pain, syncope, palpitations, pedal edema  Respiratory: Denies cough, shortness of breath, wheezing GI: Denies severe indigestion/heartburn, abdominal pain, nausea, vomiting, diarrhea and constipation GU: Denies hematuria, incontinence, dysuria  Musculoskeletal: Denies arthritis  Skin: Denies suspicious skin lesions Neurologic: Denies focal weakness or numbness, change in vision  Past Medical History:  Diagnosis Date  . Anemia   . Depression, major, recurrent (HCC)   . Migraines   . Sickle cell anemia (HCC)     Past Surgical History:  Procedure Laterality Date  . CESAREAN SECTION    . CHOLECYSTECTOMY  2000  . MULTIPLE TOOTH EXTRACTIONS N/A   . port a cath placement Right    about 6-7 years ago  . removal of porta cath Right 09/11/15  . TUBAL LIGATION      Allergies  Allergen Reactions  . Ultram [Tramadol] Other (See Comments)    seizures  . Zofran [Ondansetron Hcl] Nausea And Vomiting  . Buprenorphine Hcl Hives and Rash   Shaking Tolerates Percocet, Norco, and buprenorphine  . Morphine And Related Hives, Rash and Other (See Comments)    Shaking Tolerates Percocet, Norco, Dilaudid, and buprenorphine  . Tape Rash    Family History  Problem Relation Age of Onset  . Sickle cell trait Father   . Sickle cell trait Mother   . Sickle cell anemia Other       Prior to Admission medications   Medication Sig Start Date End Date Taking? Authorizing Provider  DULoxetine (CYMBALTA) 60 MG capsule Take 1 capsule (60 mg total) by mouth daily. 08/08/15  Yes [name], FNP  folic acid (FOLVITE) 1 MG tablet Take 1 tablet (1 mg total) by mouth daily. 08/08/15  Yes [name], FNP  hydroxyurea (HYDREA) 500 MG capsule TAKE 2 CAPSULES BY MOUTH DAILY. MAY TAKE WITH FOOD TO MINIMIZE GI SIDE EFFECTS. 08/08/15  Yes [name], FNP  ibuprofen (ADVIL,MOTRIN) 200 MG tablet Take 400 mg by mouth every 6 (six) hours as needed for moderate pain.    Yes Historical Provider, MD  morphine (MS CONTIN) 30 MG 12 hr tablet Take 1 tablet (30 mg total) by mouth every 12 (twelve) hours. 09/06/15  Yes Quentin Angst, MD  oxyCODONE-acetaminophen (PERCOCET) 10-325 MG tablet Take 1 tablet by mouth every 4 (four) hours as needed for pain. 09/13/15  Yes Henrietta Hoover, NP  Topiramate ER 100 MG CP24 Take 100 mg by mouth at bedtime.   Yes Historical Provider, MD     Physical Exam: Vitals:   09/24/15 8119  BP: 101/69  Pulse: 87  Resp: 16  Temp: 98.5 F (36.9 C)  TempSrc: Oral  SpO2: 98%  Weight: 125 lb (56.7 kg)  Height: 5' (1.524 m)    General: Alert, awake, afebrile, anicteric, not in obvious distress HEENT: Normocephalic and Atraumatic, Mucous membranes pink                PERRLA; EOM intact; No scleral icterus,                 Nares: Patent, Oropharynx: Clear, Fair Dentition                 Neck: FROM, no cervical lymphadenopathy, thyromegaly, carotid bruit or JVD;  CHEST WALL: No tenderness  CHEST: Normal respiration,  clear to auscultation bilaterally  HEART: Regular rate and rhythm; no murmurs rubs or gallops  BACK: No kyphosis or scoliosis; no CVA tenderness  ABDOMEN: Positive Bowel Sounds, soft, non-tender; no masses, no organomegaly EXTREMITIES: No cyanosis, clubbing, or edema SKIN:  no rash or ulceration  CNS: Alert and Oriented x 4, Nonfocal exam, CN 2-12 intact  Labs on Admission:  Basic Metabolic Panel: No results for input(s): NA, K, CL, CO2, GLUCOSE, BUN, CREATININE, CALCIUM, MG, PHOS in the last 168 hours. Liver Function Tests: No results for input(s): AST, ALT, ALKPHOS, BILITOT, PROT, ALBUMIN in the last 168 hours. No results for input(s): LIPASE, AMYLASE in the last 168 hours. No results for input(s): AMMONIA in the last 168 hours. CBC: No results for input(s): WBC, NEUTROABS, HGB, HCT, MCV, PLT in the last 168 hours. Cardiac Enzymes: No results for input(s): CKTOTAL, CKMB, CKMBINDEX, TROPONINI in the last 168 hours.  BNP (last 3 results) No results for input(s): BNP in the last 8760 hours.  ProBNP (last 3 results) No results for input(s): PROBNP in the last 8760 hours.  CBG: No results for input(s): GLUCAP in the last 168 hours.   Assessment/Plan Active Problems:   Sickle cell anemia with pain (HCC)   Ad[facility] to the Day Hospital  IVF D5 .45% Saline @ 125 mls/hour  Weight based Dilaudid PCA started within 30 minutes of admission  IV Toradol 30 mg Q 6 H  Monitor vitals very closely, Re-evaluate pain scale every hour  2 L of Oxygen by [place]  Patient will be re-evaluated for pain in the context of function and relationship to baseline as care progresses.  If no significant relieve from pain (remains above 5/10) will transfer patient to inpatient services for further evaluation and management  Code Status: Full  Family Communication: None  DVT Prophylaxis: Ambulate as tolerated   Time spent: 35 Minutes  Kairon Shock, MD, MHA, FACP, FAAP, CPE  If 7PM-7AM,  please contact night-coverage [email or web address] 09/24/2015, 9:15 AM

## 2015-09-24 NOTE — Telephone Encounter (Signed)
Rx request 

## 2015-09-25 ENCOUNTER — Telehealth: Payer: Self-pay | Admitting: Internal Medicine

## 2015-09-25 ENCOUNTER — Telehealth: Payer: Self-pay

## 2015-09-25 NOTE — Telephone Encounter (Signed)
Pt requests additional oxycodone 10 mg PO medication to take for next couple of days until she can get prescription filled on Friday this week

## 2015-09-25 NOTE — Telephone Encounter (Signed)
This has been sent to Dr. Hyman Hopes. Thanks!

## 2015-09-25 NOTE — Telephone Encounter (Signed)
Dr. Hyman Hopes  Patient is requesting a refill on oxycodone 10mg  for her breakthrough pain. Please advise. Thanks!

## 2015-10-01 ENCOUNTER — Encounter: Payer: Self-pay | Admitting: Internal Medicine

## 2015-10-01 ENCOUNTER — Non-Acute Institutional Stay (HOSPITAL_COMMUNITY)
Admission: AD | Admit: 2015-10-01 | Discharge: 2015-10-01 | Disposition: A | Discharge status: Home / Self Care | Payer: Medicaid Other | Source: Ambulatory Visit | Attending: Internal Medicine | Admitting: Internal Medicine

## 2015-10-01 ENCOUNTER — Ambulatory Visit (INDEPENDENT_AMBULATORY_CARE_PROVIDER_SITE_OTHER): Payer: Medicaid Other | Admitting: Internal Medicine

## 2015-10-01 ENCOUNTER — Encounter (HOSPITAL_COMMUNITY): Payer: Self-pay | Admitting: *Deleted

## 2015-10-01 VITALS — BP 109/77 | HR 104 | Temp 98.3°F | Resp 18 | Ht 60.0 in | Wt 122.0 lb

## 2015-10-01 DIAGNOSIS — G894 Chronic pain syndrome: Secondary | ICD-10-CM

## 2015-10-01 DIAGNOSIS — F329 Major depressive disorder, single episode, unspecified: Secondary | ICD-10-CM | POA: Diagnosis not present

## 2015-10-01 DIAGNOSIS — D5700 Hb-SS disease with crisis, unspecified: Secondary | ICD-10-CM

## 2015-10-01 DIAGNOSIS — M79606 Pain in leg, unspecified: Secondary | ICD-10-CM | POA: Diagnosis present

## 2015-10-01 DIAGNOSIS — G43909 Migraine, unspecified, not intractable, without status migrainosus: Secondary | ICD-10-CM | POA: Insufficient documentation

## 2015-10-01 DIAGNOSIS — Z79899 Other long term (current) drug therapy: Secondary | ICD-10-CM | POA: Insufficient documentation

## 2015-10-01 MED ORDER — POLYETHYLENE GLYCOL 3350 17 G PO PACK
17.0000 g | PACK | Freq: Every day | ORAL | Status: DC | PRN
  Filled 2015-10-01: qty 1

## 2015-10-01 MED ORDER — KETOROLAC TROMETHAMINE 30 MG/ML IJ SOLN
30.0000 mg | Freq: Four times a day (QID) | INTRAMUSCULAR | Status: DC
  Administered 2015-10-01: 30 mg via INTRAVENOUS
  Filled 2015-10-01: qty 1

## 2015-10-01 MED ORDER — SODIUM CHLORIDE 0.9% FLUSH: 9.0000 mL | INTRAVENOUS | Status: DC | PRN

## 2015-10-01 MED ORDER — HYDROMORPHONE HCL 2 MG/ML IJ SOLN
1.0000 mg | Freq: Once | INTRAMUSCULAR | Status: AC
  Administered 2015-10-01: 1 mg via INTRAVENOUS
  Filled 2015-10-01: qty 1

## 2015-10-01 MED ORDER — HYDROMORPHONE 1 MG/ML IV SOLN
INTRAVENOUS | Status: DC
Start: 2015-10-01 — End: 2015-10-01
  Administered 2015-10-01: 12.5 mg via INTRAVENOUS
  Administered 2015-10-01: 6 mg via INTRAVENOUS
  Administered 2015-10-01: 10:00:00 via INTRAVENOUS
  Filled 2015-10-01: qty 25

## 2015-10-01 MED ORDER — DIPHENHYDRAMINE HCL 25 MG PO CAPS
25.0000 mg | ORAL_CAPSULE | ORAL | Status: DC | PRN
  Administered 2015-10-01: 25 mg via ORAL
  Filled 2015-10-01: qty 1

## 2015-10-01 MED ORDER — SENNOSIDES-DOCUSATE SODIUM 8.6-50 MG PO TABS: 1.0000 | ORAL_TABLET | Freq: Two times a day (BID) | ORAL | Status: DC

## 2015-10-01 MED ORDER — DEXTROSE-NACL 5-0.45 % IV SOLN
INTRAVENOUS | Status: DC
  Administered 2015-10-01: 10:00:00 via INTRAVENOUS

## 2015-10-01 MED ORDER — ONDANSETRON HCL 4 MG/2ML IJ SOLN: 4.0000 mg | Freq: Four times a day (QID) | INTRAMUSCULAR | Status: DC | PRN

## 2015-10-01 MED ORDER — SODIUM CHLORIDE 0.9 % IV SOLN
25.0000 mg | INTRAVENOUS | Status: DC | PRN
  Administered 2015-10-01: 25 mg via INTRAVENOUS
  Filled 2015-10-01 (×3): qty 0.5

## 2015-10-01 MED ORDER — NALOXONE HCL 0.4 MG/ML IJ SOLN: 0.4000 mg | INTRAMUSCULAR | Status: DC | PRN

## 2015-10-01 NOTE — Progress Notes (Signed)
Patient is here for FU  Patient complains of pain in her hip and legs described as sharp and shooting down her legs. Pain is scaled currently at an 8 and began last night.  Patient has taken meidcation today and patient has not eaten.

## 2015-10-01 NOTE — H&P (Signed)
[facility] History and Physical  Brittany Foster RUE:[id number] DOB: Feb 22, 1986 DOA: 10/01/2015  PCP: Jeanann Lewandowsky, MD   Chief Complaint: Pain in arms and legs 8/10  HPI: Brittany Foster is a 30 y.o. female with history of sickle cell disease and frequent sickle cell pain crisis and opiate tolerant presented earlier today for follow up visit at the clinic but she is complaining of pain in her legs and arms since 3 days ago, typicqal of her sickle cell crisis. She said she went to the pool with her daughter 3 days ago, may have been dehydrated from activities and has been having worsening pain since then. She rates it at 8/10, throbbing. No trauma. No fever. She denies any chest pain, no SOB, no dizziness, no headaches, no urinary symptom. No N/V/D. She was therefore transferred to the [facility] for pain management.  Systemic Review: General: The patient denies anorexia, fever, weight loss Cardiac: Denies chest pain, syncope, palpitations, pedal edema  Respiratory: Denies cough, shortness of breath, wheezing GI: Denies severe indigestion/heartburn, abdominal pain, nausea, vomiting, diarrhea and constipation GU: Denies hematuria, incontinence, dysuria  Musculoskeletal: Denies arthritis  Skin: Denies suspicious skin lesions Neurologic: Denies focal weakness or numbness, change in vision  Past Medical History:  Diagnosis Date  . Anemia   . Depression, major, recurrent (HCC)   . Migraines   . Sickle cell anemia (HCC)     Past Surgical History:  Procedure Laterality Date  . CESAREAN SECTION    . CHOLECYSTECTOMY  2000  . MULTIPLE TOOTH EXTRACTIONS N/A   . port a cath placement Right    about 6-7 years ago  . removal of porta cath Right 09/11/15  . TUBAL LIGATION      Allergies  Allergen Reactions  . Ultram [Tramadol] Other (See Comments)    seizures  . Zofran [Ondansetron Hcl] Nausea And Vomiting  . Buprenorphine Hcl Hives and Rash    Shaking Tolerates  Percocet, Norco, and buprenorphine  . Morphine And Related Hives, Rash and Other (See Comments)    Shaking Tolerates Percocet, Norco, Dilaudid, and buprenorphine  . Tape Rash    Family History  Problem Relation Age of Onset  . Sickle cell trait Father   . Sickle cell trait Mother   . Sickle cell anemia Other       Prior to Admission medications   Medication Sig Start Date End Date Taking? Authorizing Provider  DULoxetine (CYMBALTA) 60 MG capsule Take 1 capsule (60 mg total) by mouth daily. 08/08/15   [name], FNP  folic acid (FOLVITE) 1 MG tablet Take 1 tablet (1 mg total) by mouth daily. 08/08/15   [name], FNP  hydroxyurea (HYDREA) 500 MG capsule TAKE 2 CAPSULES BY MOUTH DAILY. MAY TAKE WITH FOOD TO MINIMIZE GI SIDE EFFECTS. 08/08/15   [name], FNP  ibuprofen (ADVIL,MOTRIN) 200 MG tablet Take 400 mg by mouth every 6 (six) hours as needed for moderate pain.     Historical Provider, MD  morphine (MS CONTIN) 30 MG 12 hr tablet Take 1 tablet (30 mg total) by mouth every 12 (twelve) hours. 09/06/15   Quentin Angst, MD  oxyCODONE-acetaminophen (PERCOCET) 10-325 MG tablet Take 1 tablet by mouth every 4 (four) hours as needed for pain. 09/27/15   Quentin Angst, MD  Topiramate ER 100 MG CP24 Take 100 mg by mouth at bedtime.    Historical Provider, MD     Physical Exam: There were no vitals filed  for this visit.  General: Alert, awake, afebrile, anicteric, not in obvious distress HEENT: Normocephalic and Atraumatic, Mucous membranes pink                PERRLA; EOM intact; No scleral icterus,                 Nares: Patent, Oropharynx: Clear, Fair Dentition                 Neck: FROM, no cervical lymphadenopathy, thyromegaly, carotid bruit or JVD;  CHEST WALL: No tenderness  CHEST: Normal respiration, clear to auscultation bilaterally  HEART: Regular rate and rhythm; no murmurs rubs or gallops  BACK: No kyphosis or scoliosis; no CVA tenderness  ABDOMEN:  Positive Bowel Sounds, soft, non-tender; no masses, no organomegaly EXTREMITIES: No cyanosis, clubbing, or edema SKIN:  no rash or ulceration  CNS: Alert and Oriented x 4, Nonfocal exam, CN 2-12 intact  Labs on Admission:  Basic Metabolic Panel: No results for input(s): NA, K, CL, CO2, GLUCOSE, BUN, CREATININE, CALCIUM, MG, PHOS in the last 168 hours. Liver Function Tests: No results for input(s): AST, ALT, ALKPHOS, BILITOT, PROT, ALBUMIN in the last 168 hours. No results for input(s): LIPASE, AMYLASE in the last 168 hours. No results for input(s): AMMONIA in the last 168 hours. CBC:  Recent Labs Lab 09/24/15 1040  WBC 12.0*  NEUTROABS 7.4  HGB 9.0*  HCT 25.8*  MCV 100.4*  PLT 370   Cardiac Enzymes: No results for input(s): CKTOTAL, CKMB, CKMBINDEX, TROPONINI in the last 168 hours.  BNP (last 3 results) No results for input(s): BNP in the last 8760 hours.  ProBNP (last 3 results) No results for input(s): PROBNP in the last 8760 hours.  CBG: No results for input(s): GLUCAP in the last 168 hours.   Assessment/Plan Active Problems:   Sickle cell anemia with pain (HCC)   Admits to the Day Hospital  IVF D5 .45% Saline @ 150 mls/hour  Weight based Dilaudid PCA started within 30 minutes of admission  IV Toradol 30 mg Q 6 H  Monitor vitals very closely, Re-evaluate pain scale every hour  2 L of Oxygen by [place]  Patient will be re-evaluated for pain in the context of function and relationship to baseline as care progresses.  If no significant relieve from pain (remains above 5/10) will transfer patient to inpatient services for further evaluation and management  Code Status: Full  Family Communication: None  DVT Prophylaxis: Ambulate as tolerated   Time spent: 35 Minutes  Tylena Prisk, MD, MHA, FACP, FAAP, CPE  If 7PM-7AM, please contact night-coverage [email or web address] 10/01/2015, 9:24 AM

## 2015-10-01 NOTE — Discharge Summary (Signed)
Physician Discharge Summary  Brittany Foster XBL:[id number] DOB: 10-17-1985 DOA: 10/01/2015  PCP: Jeanann Lewandowsky, MD  Admit date: 10/01/2015  Discharge date: 10/01/2015  Time spent: 30 minutes  Discharge Diagnoses:  Active Problems:   Sickle cell anemia with pain [facility])   Discharge Condition: Stable  Diet recommendation: Regular  Filed Weights   10/01/15 0923  Weight: 122 lb (55.3 kg)    History of present illness:  Brittany Foster is a 30 y.o. female with history of sickle cell disease and frequent sickle cell pain crisis and opiate tolerant presented earlier today for follow up visit at the clinic but she is complaining of pain in her legs and arms since 3 days ago, typicqal of her sickle cell crisis. She said she went to the pool with her daughter 3 days ago, may have been dehydrated from activities and has been having worsening pain since then. She rates it at 8/10, throbbing. No trauma. No fever. She denies any chest pain, no SOB, no dizziness, no headaches, no urinary symptom. No N/V/D. She was therefore transferred to the [facility] for pain management.  Hospital Course:  Brittany Foster was admitted to the day hospital with sickle cell painful crisis. Patient was treated with weight based IV Dilaudid PCA, IV Toradol as well as IV fluids. Brittany Foster significant improvement symptomatically, pain improved from 8 to 5 out of 10 at the time of discharge. She was give IV push 1 mg dilaudid before discharge. Brittany Foster will follow-up at the clinic as previously scheduled, continue with home medications as per prior to admission.  Discharge Exam: Vitals:   10/01/15 1500 10/01/15 1626  BP: 101/63   Pulse: 93   Resp: 14 15  Temp:      General appearance: alert, cooperative and no distress Eyes: conjunctivae/corneas clear. PERRL, EOM's intact. Fundi benign. Neck: no adenopathy, no carotid bruit, no JVD, supple, symmetrical, trachea midline and thyroid not enlarged, symmetric, no  tenderness/mass/nodules Back: symmetric, no curvature. ROM normal. No CVA tenderness. Resp: clear to auscultation bilaterally Chest wall: no tenderness Cardio: regular rate and rhythm, S1, S2 normal, no murmur, click, rub or gallop GI: soft, non-tender; bowel sounds normal; no masses, no organomegaly Extremities: extremities normal, atraumatic, no cyanosis or edema Pulses: 2+ and symmetric Skin: Skin color, texture, turgor normal. No rashes or lesions Neurologic: Grossly normal  Discharge Instructions We discussed the need for good hydration, monitoring of hydration status, avoidance of heat, cold, stress, and infection triggers. We discussed the need to be compliant with taking Hydrea. Brittany Foster was reminded of the need to seek medical attention of any symptoms of bleeding, anemia, or infection occurs.  Discharge Instructions    Increase activity slowly    Complete by:  As directed     Current Discharge Medication List    CONTINUE these medications which have NOT CHANGED   Details  DULoxetine (CYMBALTA) 60 MG capsule Take 1 capsule (60 mg total) by mouth daily. Qty: 30 capsule, Refills: 3   Associated Diagnoses: Chronic pain syndrome    folic acid (FOLVITE) 1 MG tablet Take 1 tablet (1 mg total) by mouth daily. 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 MAY TAKE WITH FOOD TO MINIMIZE GI SIDE EFFECTS. Qty: 60 capsule, Refills: 3   Associated Diagnoses: Hb-SS disease without crisis (HCC)    ibuprofen (ADVIL,MOTRIN) 200 MG tablet Take 400 mg by mouth every  6 (six) hours as needed for moderate pain.     morphine (MS CONTIN) 30 MG 12 hr tablet Take 1 tablet (30 mg total) by mouth every 12 (twelve) hours. Qty: 30 tablet, Refills: 0   Associated Diagnoses: Hb-SS disease without crisis (HCC)    oxyCODONE-acetaminophen (PERCOCET) 10-325 MG tablet Take 1 tablet by mouth  every 4 (four) hours as needed for pain. Qty: 90 tablet, Refills: 0   Associated Diagnoses: Hb-SS disease without crisis (HCC)    Topiramate ER 100 MG CP24 Take 100 mg by mouth at bedtime.       Allergies  Allergen Reactions  . Ultram [Tramadol] Other (See Comments)    seizures  . Zofran [Ondansetron Hcl] Nausea And Vomiting  . Buprenorphine Hcl Hives and Rash    Shaking Tolerates Percocet, Norco, and buprenorphine  . Morphine And Related Hives, Rash and Other (See Comments)    Shaking Tolerates Percocet, Norco, Dilaudid, and buprenorphine  . Tape Rash     Significant Diagnostic Studies: Dg Chest 2 View  Result Date: 09/02/2015 CLINICAL DATA:  Sickle cell crisis. Pain worse last night. Chest pain, shortness of breath. EXAM: CHEST  2 VIEW COMPARISON:  08/21/2015 FINDINGS: Right subclavian Port-A-Cath remains in place, unchanged. Heart and mediastinal contours are within normal limits. No focal opacities or effusions. No acute bony abnormality. IMPRESSION: No active cardiopulmonary disease. Electronically Signed   By: Charlett Nose M.D.   On: 09/02/2015 15:04   Ct Angio Chest Pe W/cm &/or Wo Cm  Result Date: 09/02/2015 CLINICAL DATA:  Shortness of breath. Diffuse body pain secondary to sickle cell crisis. EXAM: CT ANGIOGRAPHY CHEST WITH CONTRAST TECHNIQUE: Multidetector CT imaging of the chest was performed using the standard protocol during bolus administration of intravenous contrast. Multiplanar CT image reconstructions and MIPs were obtained to evaluate the vascular anatomy. CONTRAST:  48 cc Isovue 370 COMPARISON:  Chest x-ray dated 09/02/2015 and CT angiogram dated 07/12/2015 and 12/30/2013 FINDINGS: Mediastinum/Lymph Nodes: There is a small chronic pericardial effusion. Overall heart size is normal. RV/ LV ratio is normal. No hilar or mediastinal adenopathy. Incidental note is made of a catheter fragment in the left innominate vein anterior to the aortic arch, unchanged since  12/30/2013. Port-A-Cath appears in good position. No pulmonary emboli. Lungs/Pleura: No acute abnormality. Small focal area of pleural thickening laterally at the right lung base, stable since 12/30/2013. No infiltrates. No effusions. Upper abdomen: Small calcified spleen consistent with auto infarction. Chronic hepatomegaly. Musculoskeletal: No acute abnormality. Sclerosis of the bones consistent with the patient's history of sickle cell disease. Review of the MIP images confirms the above findings. IMPRESSION: 1. No pulmonary emboli or other acute abnormalities. 2. Chronic small pericardial effusion. 3. Catheter fragment in the left innominate vein, unchanged since 12/30/2013. Electronically Signed   By: Francene Boyers M.D.   On: 09/02/2015 16:12    Signed:  Jeanann Lewandowsky MD, MHA, FACP, FAAP, CPE   10/01/2015, 4:48 PM

## 2015-10-01 NOTE — Progress Notes (Signed)
Patient ID: Brittany Foster, female   DOB: [birth year], 30 y.o.   MRN: [id number] [facility] pain in rib cage and generalized pain in legs and arms. Rating pain intensity 9/10. Treated with IV fluids, IV Toradol and PCA Dilaudid. Received 1 mg Dilaudid injection prior to discharge. Pain level 6/10 at discharge. She feels that she can control pain at home. Went over discharge instructions and copy given to patient. Explained to continue home medications and to drink plenty of water after discharge. Discharged to home after calling ride.

## 2015-10-01 NOTE — Progress Notes (Signed)
Brittany Foster, is a 30 y.o. female  RSW:[id number]  KXF:[id number]  DOB - 12-26-1985  Chief Complaint  Patient presents with  . Follow-up        Subjective:   Brittany Foster is a 30 y.o. female with sickle cell disease and frequent sickle cell pain crisis and opiate tolerant here today for a follow up visit. She is complaining of pain in her legs and arms since 3 days ago, typicqal of her sickle cell crisis. She said she went to the pool with her daughter 3 days ago, may have been dehydrated from activities and has been having worsening pain since then. She rates it at 8/10, throbbing. No trauma. No fever. She denies any chest pain, no SOB, no dizziness, no headaches, no urinary symptom. No N/V/D.  No problems updated.  ALLERGIES: Allergies  Allergen Reactions  . Ultram [Tramadol] Other (See Comments)    seizures  . Zofran [Ondansetron Hcl] Nausea And Vomiting  . Buprenorphine Hcl Hives and Rash    Shaking Tolerates Percocet, Norco, and buprenorphine  . Morphine And Related Hives, Rash and Other (See Comments)    Shaking Tolerates Percocet, Norco, Dilaudid, and buprenorphine  . Tape Rash    PAST MEDICAL HISTORY: Past Medical History:  Diagnosis Date  . Anemia   . Depression, major, recurrent (HCC)   . Migraines   . Sickle cell anemia (HCC)     MEDICATIONS AT HOME: Prior to Admission medications   Medication Sig Start Date End Date Taking? Authorizing Provider  DULoxetine (CYMBALTA) 60 MG capsule Take 1 capsule (60 mg total) by mouth daily. 08/08/15  Yes [name], FNP  folic acid (FOLVITE) 1 MG tablet Take 1 tablet (1 mg total) by mouth daily. 08/08/15  Yes [name], FNP  hydroxyurea (HYDREA) 500 MG capsule TAKE 2 CAPSULES BY MOUTH DAILY. MAY TAKE WITH FOOD TO MINIMIZE GI SIDE EFFECTS. 08/08/15  Yes [name], FNP  ibuprofen (ADVIL,MOTRIN) 200 MG tablet Take 400 mg by mouth every 6 (six) hours as needed for moderate pain.    Yes Historical Provider, MD   morphine (MS CONTIN) 30 MG 12 hr tablet Take 1 tablet (30 mg total) by mouth every 12 (twelve) hours. 09/06/15  Yes Quentin Angst, MD  oxyCODONE-acetaminophen (PERCOCET) 10-325 MG tablet Take 1 tablet by mouth every 4 (four) hours as needed for pain. 09/27/15  Yes Quentin Angst, MD  Topiramate ER 100 MG CP24 Take 100 mg by mouth at bedtime.   Yes Historical Provider, MD     Objective:   Vitals:   10/01/15 0844  BP: 109/77  Pulse: (!) 104  Resp: 18  Temp: 98.3 F (36.8 C)  TempSrc: Oral  SpO2: 100%  Weight: 122 lb (55.3 kg)  Height: 5' (1.524 m)    Exam General appearance : Awake, alert, not in any distress. Speech Clear. Not toxic looking HEENT: Atraumatic and Normocephalic, pupils equally reactive to light and accomodation Neck: Supple, no JVD. No cervical lymphadenopathy.  Chest: Good air entry bilaterally, no added sounds  CVS: S1 S2 regular, no murmurs.  Abdomen: Bowel sounds present, Non tender and not distended with no gaurding, rigidity or rebound. Extremities: B/L Lower Ext shows no edema, both legs are warm to touch Neurology: Awake alert, and oriented X 3, CN II-XII intact, Non focal Skin: No Rash  Data Review No results found for: HGBA1C   Assessment & Plan   1. Hb-SS disease with crisis (HCC)  [facility] and [facility]  Continue Hydrea. We discussed the need for good hydration, monitoring of hydration status, avoidance of heat, cold, stress, and infection triggers. We discussed the risks and benefits of Hydrea, including bone marrow suppression, the possibility of GI upset, skin ulcers, hair thinning, and teratogenicity. The patient was reminded of the need to seek medical attention of any symptoms of bleeding, anemia, or infection. Continue folic acid 1 mg daily to prevent aplastic bone marrow crises.   Pulmonary evaluation - Patient denies severe recurrent wheezes, shortness of breath with exercise, or  persistent cough. If these symptoms develop, pulmonary function tests with spirometry will be ordered, and if abnormal, plan on referral to Pulmonology for further evaluation.  Immunization status - Yearly influenza vaccination is recommended, as well as being up to date with Meningococcal and Pneumococcal vaccines.   2. Chronic pain syndrome We agreed on Opiate dose and amount of pills  per month. We discussed that pt is to receive Schedule II prescriptions only from our clinic. Pt is also aware that the prescription history is available to [place] online through the [facility] CSRS. Controlled substance agreement reviewed and signed. We reminded Brittany Foster that all patients receiving Schedule II narcotics must be seen for follow within one month of prescription being requested. We reviewed the terms of our pain agreement, including the need to keep medicines in a safe locked location away from children or pets, and the need to report excess sedation or constipation, measures to avoid constipation, and policies related to early refills and stolen prescriptions. According to the [place] [facility] program, we have reviewed details related to analgesia, adverse effects and aberrant behaviors.  Patient have been counseled extensively about nutrition and exercise  Return in about 4 weeks (around 10/29/2015) for Sickle Cell Disease/Pain.  The patient was given clear instructions to go to ER or return to medical center if symptoms don't improve, worsen or new problems develop. The patient verbalized understanding. The patient was told to call to get lab results if they haven't heard anything in the next week.   This note has been created with Education officer, environmental. Any transcriptional errors are unintentional.    Jeanann Lewandowsky, MD, MHA, Maxwell Caul, CPE [facility] [place], [place] [phone]   10/01/2015, 9:10 AM

## 2015-10-01 NOTE — Discharge Instructions (Signed)
Sickle Cell Anemia, Adult Sickle cell anemia is a condition in which red blood cells have an abnormal "sickle" shape. This abnormal shape shortens the cells' life span, which results in a lower than normal concentration of red blood cells in the blood. The sickle shape also causes the cells to clump together and block free blood flow through the blood vessels. As a result, the tissues and organs of the body do not receive enough oxygen. Sickle cell anemia causes organ damage and pain and increases the risk of infection. CAUSES  Sickle cell anemia is a genetic disorder. Those who receive two copies of the gene have the condition, and those who receive one copy have the trait. RISK FACTORS The sickle cell gene is most common in people whose families originated in [place]. Other areas of the globe where sickle cell trait occurs include the Mediterranean, [place] and [place], the [place], and the [place].  SIGNS AND SYMPTOMS  Pain, especially in the extremities, back, chest, or abdomen (common). The pain may start suddenly or may develop following an illness, especially if there is dehydration. Pain can also occur due to overexertion or exposure to extreme temperature changes.  Frequent severe bacterial infections, especially certain types of pneumonia and meningitis.  Pain and swelling in the hands and feet.  Decreased activity.   Loss of appetite.   Change in behavior.  Headaches.  Seizures.  Shortness of breath or difficulty breathing.  Vision changes.  Skin ulcers. Those with the trait may not have symptoms or they may have mild symptoms.  DIAGNOSIS  Sickle cell anemia is diagnosed with blood tests that demonstrate the genetic trait. It is often diagnosed during the newborn period, due to mandatory testing nationwide. A variety of blood tests, X-rays, CT scans, MRI scans, ultrasounds, and lung function tests may also be done to monitor the condition. TREATMENT  Sickle  cell anemia may be treated with:  Medicines. You may be given pain medicines, antibiotic medicines (to treat and prevent infections) or medicines to increase the production of certain types of hemoglobin.  Fluids.  Oxygen.  Blood transfusions. HOME CARE INSTRUCTIONS   Drink enough fluid to keep your urine clear or pale yellow. Increase your fluid intake in hot weather and during exercise.  Do not smoke. Smoking lowers oxygen levels in the blood.   Only take over-the-counter or prescription medicines for pain, fever, or discomfort as directed by your health care provider.  Take antibiotics as directed by your health care provider. Make sure you finish them it even if you start to feel better.   Take supplements as directed by your health care provider.   Consider wearing a medical alert bracelet. This tells anyone caring for you in an emergency of your condition.   When traveling, keep your medical information, health care provider's names, and the medicines you take with you at all times.   If you develop a fever, do not take medicines to reduce the fever right away. This could cover up a problem that is developing. Notify your health care provider.  Keep all follow-up appointments with your health care provider. Sickle cell anemia requires regular medical care. SEEK MEDICAL CARE IF: You have a fever. SEEK IMMEDIATE MEDICAL CARE IF:   You feel dizzy or faint.   You have new abdominal pain, especially on the left side near the stomach area.   You develop a persistent, often uncomfortable and painful penile erection (priapism). If this is not treated immediately it   will lead to impotence.   You have numbness your arms or legs or you have a hard time moving them.   You have a hard time with speech.   You have a fever or persistent symptoms for more than 2-3 days.   You have a fever and your symptoms suddenly get worse.   You have signs or symptoms of infection.  These include:   Chills.   Abnormal tiredness (lethargy).   Irritability.   Poor eating.   Vomiting.   You develop pain that is not helped with medicine.   You develop shortness of breath.  You have pain in your chest.   You are coughing up pus-like or bloody sputum.   You develop a stiff neck.  Your feet or hands swell or have pain.  Your abdomen appears bloated.  You develop joint pain. MAKE SURE YOU:  Understand these instructions.   This information is not intended to replace advice given to you by your health care provider. Make sure you discuss any questions you have with your health care provider.   Document Released: 05/27/2005 Document Revised: 03/09/2014 Document Reviewed: 09/28/2012 Elsevier Interactive Patient Education 2016 [employer]  

## 2015-10-01 NOTE — Patient Instructions (Signed)
Sickle Cell Anemia, Adult Sickle cell anemia is a condition in which red blood cells have an abnormal "sickle" shape. This abnormal shape shortens the cells' life span, which results in a lower than normal concentration of red blood cells in the blood. The sickle shape also causes the cells to clump together and block free blood flow through the blood vessels. As a result, the tissues and organs of the body do not receive enough oxygen. Sickle cell anemia causes organ damage and pain and increases the risk of infection. CAUSES  Sickle cell anemia is a genetic disorder. Those who receive two copies of the gene have the condition, and those who receive one copy have the trait. RISK FACTORS The sickle cell gene is most common in people whose families originated in [place]. Other areas of the globe where sickle cell trait occurs include the Mediterranean, [place] and [place], the [place], and the [place].  SIGNS AND SYMPTOMS  Pain, especially in the extremities, back, chest, or abdomen (common). The pain may start suddenly or may develop following an illness, especially if there is dehydration. Pain can also occur due to overexertion or exposure to extreme temperature changes.  Frequent severe bacterial infections, especially certain types of pneumonia and meningitis.  Pain and swelling in the hands and feet.  Decreased activity.   Loss of appetite.   Change in behavior.  Headaches.  Seizures.  Shortness of breath or difficulty breathing.  Vision changes.  Skin ulcers. Those with the trait may not have symptoms or they may have mild symptoms.  DIAGNOSIS  Sickle cell anemia is diagnosed with blood tests that demonstrate the genetic trait. It is often diagnosed during the newborn period, due to mandatory testing nationwide. A variety of blood tests, X-rays, CT scans, MRI scans, ultrasounds, and lung function tests may also be done to monitor the condition. TREATMENT  Sickle  cell anemia may be treated with:  Medicines. You may be given pain medicines, antibiotic medicines (to treat and prevent infections) or medicines to increase the production of certain types of hemoglobin.  Fluids.  Oxygen.  Blood transfusions. HOME CARE INSTRUCTIONS   Drink enough fluid to keep your urine clear or pale yellow. Increase your fluid intake in hot weather and during exercise.  Do not smoke. Smoking lowers oxygen levels in the blood.   Only take over-the-counter or prescription medicines for pain, fever, or discomfort as directed by your health care provider.  Take antibiotics as directed by your health care provider. Make sure you finish them it even if you start to feel better.   Take supplements as directed by your health care provider.   Consider wearing a medical alert bracelet. This tells anyone caring for you in an emergency of your condition.   When traveling, keep your medical information, health care provider's names, and the medicines you take with you at all times.   If you develop a fever, do not take medicines to reduce the fever right away. This could cover up a problem that is developing. Notify your health care provider.  Keep all follow-up appointments with your health care provider. Sickle cell anemia requires regular medical care. SEEK MEDICAL CARE IF: You have a fever. SEEK IMMEDIATE MEDICAL CARE IF:   You feel dizzy or faint.   You have new abdominal pain, especially on the left side near the stomach area.   You develop a persistent, often uncomfortable and painful penile erection (priapism). If this is not treated immediately it   will lead to impotence.   You have numbness your arms or legs or you have a hard time moving them.   You have a hard time with speech.   You have a fever or persistent symptoms for more than 2-3 days.   You have a fever and your symptoms suddenly get worse.   You have signs or symptoms of infection.  These include:   Chills.   Abnormal tiredness (lethargy).   Irritability.   Poor eating.   Vomiting.   You develop pain that is not helped with medicine.   You develop shortness of breath.  You have pain in your chest.   You are coughing up pus-like or bloody sputum.   You develop a stiff neck.  Your feet or hands swell or have pain.  Your abdomen appears bloated.  You develop joint pain. MAKE SURE YOU:  Understand these instructions.   This information is not intended to replace advice given to you by your health care provider. Make sure you discuss any questions you have with your health care provider.   Document Released: 05/27/2005 Document Revised: 03/09/2014 Document Reviewed: 09/28/2012 Elsevier Interactive Patient Education 2016 [employer]  

## 2015-10-02 ENCOUNTER — Other Ambulatory Visit: Payer: Self-pay | Admitting: Internal Medicine

## 2015-10-02 ENCOUNTER — Telehealth (HOSPITAL_COMMUNITY): Payer: Self-pay | Admitting: Hematology

## 2015-10-02 DIAGNOSIS — D571 Sickle-cell disease without crisis: Secondary | ICD-10-CM

## 2015-10-02 NOTE — Telephone Encounter (Signed)
Called patient and advised that orders had been placed for her port-a-cath placement.  I explained that Interventional radiology will be calling her to set up the appointment.  Advised patient to make sure she answers/returns the call so she can have the procedure done.  Patient verbalizes understanding.

## 2015-10-03 ENCOUNTER — Telehealth: Payer: Self-pay

## 2015-10-03 NOTE — Telephone Encounter (Signed)
Refill request for oxycodone 10mg  and MS contin 30mg . Please advise. Thanks!

## 2015-10-04 ENCOUNTER — Other Ambulatory Visit: Payer: Self-pay | Admitting: Internal Medicine

## 2015-10-04 DIAGNOSIS — D571 Sickle-cell disease without crisis: Secondary | ICD-10-CM

## 2015-10-04 MED ORDER — OXYCODONE-ACETAMINOPHEN 10-325 MG PO TABS: 1.0000 | ORAL_TABLET | ORAL | 0 refills | Status: DC | PRN

## 2015-10-04 MED ORDER — MORPHINE SULFATE ER 30 MG PO TBCR: 30.0000 mg | EXTENDED_RELEASE_TABLET | Freq: Two times a day (BID) | ORAL | 0 refills | Status: DC

## 2015-10-07 ENCOUNTER — Other Ambulatory Visit: Payer: Self-pay | Admitting: General Surgery

## 2015-10-08 ENCOUNTER — Encounter (HOSPITAL_COMMUNITY): Payer: Self-pay | Admitting: Emergency Medicine

## 2015-10-08 ENCOUNTER — Ambulatory Visit (HOSPITAL_COMMUNITY)
Admission: RE | Admit: 2015-10-08 | Discharge: 2015-10-08 | Disposition: A | Discharge status: Home / Self Care | Payer: Medicaid Other | Source: Ambulatory Visit | Attending: Internal Medicine | Admitting: Internal Medicine

## 2015-10-08 ENCOUNTER — Other Ambulatory Visit: Payer: Self-pay | Admitting: Internal Medicine

## 2015-10-08 ENCOUNTER — Ambulatory Visit: Payer: Medicaid Other | Admitting: Internal Medicine

## 2015-10-08 ENCOUNTER — Emergency Department (HOSPITAL_COMMUNITY): Payer: Medicaid Other

## 2015-10-08 ENCOUNTER — Other Ambulatory Visit: Payer: Self-pay

## 2015-10-08 ENCOUNTER — Inpatient Hospital Stay (HOSPITAL_COMMUNITY)
Admission: EM | Admit: 2015-10-08 | Discharge: 2015-10-13 | DRG: 812 | Disposition: A | Discharge status: Home / Self Care | Payer: Medicaid Other | Attending: Internal Medicine | Admitting: Internal Medicine

## 2015-10-08 DIAGNOSIS — D5700 Hb-SS disease with crisis, unspecified: Secondary | ICD-10-CM | POA: Diagnosis present

## 2015-10-08 DIAGNOSIS — F17200 Nicotine dependence, unspecified, uncomplicated: Secondary | ICD-10-CM | POA: Diagnosis present

## 2015-10-08 DIAGNOSIS — D72829 Elevated white blood cell count, unspecified: Secondary | ICD-10-CM | POA: Diagnosis present

## 2015-10-08 DIAGNOSIS — F329 Major depressive disorder, single episode, unspecified: Secondary | ICD-10-CM | POA: Diagnosis not present

## 2015-10-08 DIAGNOSIS — G894 Chronic pain syndrome: Secondary | ICD-10-CM | POA: Diagnosis present

## 2015-10-08 DIAGNOSIS — E876 Hypokalemia: Secondary | ICD-10-CM | POA: Diagnosis present

## 2015-10-08 DIAGNOSIS — F32A Depression, unspecified: Secondary | ICD-10-CM | POA: Diagnosis present

## 2015-10-08 DIAGNOSIS — D571 Sickle-cell disease without crisis: Secondary | ICD-10-CM | POA: Insufficient documentation

## 2015-10-08 DIAGNOSIS — Z79899 Other long term (current) drug therapy: Secondary | ICD-10-CM | POA: Diagnosis not present

## 2015-10-08 DIAGNOSIS — D638 Anemia in other chronic diseases classified elsewhere: Secondary | ICD-10-CM | POA: Diagnosis not present

## 2015-10-08 DIAGNOSIS — Z9049 Acquired absence of other specified parts of digestive tract: Secondary | ICD-10-CM

## 2015-10-08 DIAGNOSIS — R079 Chest pain, unspecified: Secondary | ICD-10-CM | POA: Diagnosis present

## 2015-10-08 DIAGNOSIS — F339 Major depressive disorder, recurrent, unspecified: Secondary | ICD-10-CM | POA: Diagnosis present

## 2015-10-08 HISTORY — PX: IR GENERIC HISTORICAL: IMG1180011

## 2015-10-08 LAB — CBC WITH DIFFERENTIAL/PLATELET
Basophils Absolute: 0 10*3/uL (ref 0.0–0.1)
Basophils Relative: 0 %
Eosinophils Absolute: 0 10*3/uL (ref 0.0–0.7)
Eosinophils Relative: 0 %
HCT: 24.8 % — ABNORMAL LOW (ref 36.0–46.0)
HEMOGLOBIN: 8.8 g/dL — AB (ref 12.0–15.0)
LYMPHS ABS: 2.7 10*3/uL (ref 0.7–4.0)
LYMPHS PCT: 18 %
MCH: 35.8 pg — AB (ref 26.0–34.0)
MCHC: 35.5 g/dL (ref 30.0–36.0)
MCV: 100.8 fL — AB (ref 78.0–100.0)
Monocytes Absolute: 1.2 10*3/uL — ABNORMAL HIGH (ref 0.1–1.0)
Monocytes Relative: 8 %
NEUTROS PCT: 74 %
Neutro Abs: 11 10*3/uL — ABNORMAL HIGH (ref 1.7–7.7)
Platelets: 307 10*3/uL (ref 150–400)
RBC: 2.46 MIL/uL — AB (ref 3.87–5.11)
RDW: 17.6 % — ABNORMAL HIGH (ref 11.5–15.5)
WBC: 14.9 10*3/uL — AB (ref 4.0–10.5)

## 2015-10-08 LAB — COMPREHENSIVE METABOLIC PANEL
ALT: 34 U/L (ref 14–54)
AST: 50 U/L — AB (ref 15–41)
Albumin: 4.9 g/dL (ref 3.5–5.0)
Alkaline Phosphatase: 85 U/L (ref 38–126)
Anion gap: 9 (ref 5–15)
BUN: 9 mg/dL (ref 6–20)
CHLORIDE: 109 mmol/L (ref 101–111)
CO2: 20 mmol/L — ABNORMAL LOW (ref 22–32)
Calcium: 9.4 mg/dL (ref 8.9–10.3)
Creatinine, Ser: 0.44 mg/dL (ref 0.44–1.00)
Glucose, Bld: 132 mg/dL — ABNORMAL HIGH (ref 65–99)
POTASSIUM: 3.3 mmol/L — AB (ref 3.5–5.1)
Sodium: 138 mmol/L (ref 135–145)
Total Bilirubin: 3.4 mg/dL — ABNORMAL HIGH (ref 0.3–1.2)
Total Protein: 8.5 g/dL — ABNORMAL HIGH (ref 6.5–8.1)

## 2015-10-08 LAB — RETICULOCYTES
RBC.: 2.44 MIL/uL — AB (ref 3.87–5.11)
RETIC COUNT ABSOLUTE: 202.5 10*3/uL — AB (ref 19.0–186.0)
Retic Ct Pct: 8.3 % — ABNORMAL HIGH (ref 0.4–3.1)

## 2015-10-08 LAB — I-STAT TROPONIN, ED: TROPONIN I, POC: 0 ng/mL (ref 0.00–0.08)

## 2015-10-08 MED ORDER — POLYETHYLENE GLYCOL 3350 17 G PO PACK: 17.0000 g | PACK | Freq: Every day | ORAL | Status: DC | PRN

## 2015-10-08 MED ORDER — ENOXAPARIN SODIUM 40 MG/0.4ML ~~LOC~~ SOLN: 40.0000 mg | SUBCUTANEOUS | Status: DC

## 2015-10-08 MED ORDER — DIPHENHYDRAMINE HCL 50 MG/ML IJ SOLN
12.5000 mg | Freq: Four times a day (QID) | INTRAMUSCULAR | Status: DC | PRN
  Administered 2015-10-09 (×2): 12.5 mg via INTRAVENOUS
  Filled 2015-10-08 (×2): qty 1

## 2015-10-08 MED ORDER — LIDOCAINE HCL 1 % IJ SOLN
INTRAMUSCULAR | Status: AC
  Filled 2015-10-08: qty 20

## 2015-10-08 MED ORDER — DIPHENHYDRAMINE HCL 50 MG/ML IJ SOLN
25.0000 mg | Freq: Once | INTRAMUSCULAR | Status: AC
  Administered 2015-10-08: 25 mg via INTRAVENOUS
  Filled 2015-10-08: qty 1

## 2015-10-08 MED ORDER — SODIUM CHLORIDE 0.9% FLUSH
3.0000 mL | Freq: Two times a day (BID) | INTRAVENOUS | Status: DC
  Administered 2015-10-08 – 2015-10-13 (×3): 3 mL via INTRAVENOUS

## 2015-10-08 MED ORDER — HEPARIN SOD (PORK) LOCK FLUSH 100 UNIT/ML IV SOLN
INTRAVENOUS | Status: AC
  Filled 2015-10-08: qty 5

## 2015-10-08 MED ORDER — LIDOCAINE HCL 1 % IJ SOLN
INTRAMUSCULAR | Status: AC | PRN
  Administered 2015-10-08: 5 mL via INTRADERMAL

## 2015-10-08 MED ORDER — DIPHENHYDRAMINE HCL 25 MG PO CAPS
25.0000 mg | ORAL_CAPSULE | Freq: Once | ORAL | Status: DC
  Filled 2015-10-08: qty 1

## 2015-10-08 MED ORDER — ACETAMINOPHEN 325 MG PO TABS
650.0000 mg | ORAL_TABLET | Freq: Four times a day (QID) | ORAL | Status: DC | PRN
Start: 2015-10-08 — End: 2015-10-09

## 2015-10-08 MED ORDER — NALOXONE HCL 0.4 MG/ML IJ SOLN: 0.4000 mg | INTRAMUSCULAR | Status: DC | PRN

## 2015-10-08 MED ORDER — SODIUM CHLORIDE 0.9% FLUSH: 9.0000 mL | INTRAVENOUS | Status: DC | PRN

## 2015-10-08 MED ORDER — DIPHENHYDRAMINE HCL 12.5 MG/5ML PO ELIX: 12.5000 mg | ORAL_SOLUTION | Freq: Four times a day (QID) | ORAL | Status: DC | PRN

## 2015-10-08 MED ORDER — PROMETHAZINE HCL 25 MG/ML IJ SOLN
25.0000 mg | Freq: Once | INTRAMUSCULAR | Status: AC
  Administered 2015-10-08: 25 mg via INTRAVENOUS
  Filled 2015-10-08 (×2): qty 1

## 2015-10-08 MED ORDER — HYDROMORPHONE 1 MG/ML IV SOLN
INTRAVENOUS | Status: DC
  Administered 2015-10-08: 25 mg via INTRAVENOUS
  Administered 2015-10-09: 6.8 mg via INTRAVENOUS
  Filled 2015-10-08: qty 25

## 2015-10-08 MED ORDER — SENNOSIDES-DOCUSATE SODIUM 8.6-50 MG PO TABS
1.0000 | ORAL_TABLET | Freq: Two times a day (BID) | ORAL | Status: DC
  Administered 2015-10-08 – 2015-10-11 (×2): 1 via ORAL
  Filled 2015-10-08 (×7): qty 1

## 2015-10-08 MED ORDER — HYDROMORPHONE HCL 1 MG/ML IJ SOLN
2.0000 mg | Freq: Once | INTRAMUSCULAR | Status: AC
Start: 2015-10-08 — End: 2015-10-08
  Administered 2015-10-08: 2 mg via INTRAVENOUS
  Filled 2015-10-08: qty 2

## 2015-10-08 MED ORDER — KETOROLAC TROMETHAMINE 30 MG/ML IJ SOLN
30.0000 mg | Freq: Once | INTRAMUSCULAR | Status: AC
  Administered 2015-10-08: 30 mg via INTRAVENOUS
  Filled 2015-10-08: qty 1

## 2015-10-08 MED ORDER — HYDROMORPHONE HCL 1 MG/ML IJ SOLN
2.0000 mg | Freq: Once | INTRAMUSCULAR | Status: AC
  Administered 2015-10-08: 2 mg via INTRAVENOUS
  Filled 2015-10-08: qty 2

## 2015-10-08 MED ORDER — SODIUM CHLORIDE 0.45 % IV SOLN
INTRAVENOUS | Status: DC
  Administered 2015-10-08 – 2015-10-10 (×4): via INTRAVENOUS

## 2015-10-08 MED ORDER — ENOXAPARIN SODIUM 40 MG/0.4ML ~~LOC~~ SOLN
40.0000 mg | SUBCUTANEOUS | Status: DC
  Administered 2015-10-08 – 2015-10-12 (×2): 40 mg via SUBCUTANEOUS
  Filled 2015-10-08 (×3): qty 0.4

## 2015-10-08 MED ORDER — KETOROLAC TROMETHAMINE 30 MG/ML IJ SOLN
30.0000 mg | Freq: Four times a day (QID) | INTRAMUSCULAR | Status: DC
  Administered 2015-10-09 – 2015-10-13 (×13): 30 mg via INTRAVENOUS
  Filled 2015-10-08 (×16): qty 1

## 2015-10-08 MED FILL — MORPHINE SULF 30 MG TAB SA: 30 | 15 days supply | Qty: 30 | Fill #0

## 2015-10-08 NOTE — H&P (Signed)
History and Physical    Brittany Foster XLK:[id number] DOB: 1985-08-03 DOA: 10/08/2015  PCP: Angelica Chessman, MD   Patient coming from: Home.  Chief Complaint: Sickle cell pain crisis.  HPI: Brittany Foster is a 30 y.o. female with medical history significant of sickle cell anemia, depression, migraine headaches, chronic pain syndrome comes emergency department due to sickle cell pain crisis with associated chest pain.  Per patient, yesterday evening she started developing pain in her lower extremities with mild dyspnea typical of her sickle cell pain crisis. She denies fever, chills, but complains of fatigue. She also states that she developed pressure-like chest pain, nonradiating, but persistent as well. She denies dizziness, palpitations, diaphoresis, pitting edema lower extremities, PND or orthopnea. She typically does not have chest pain with her sickle cell pain crisis.  ED Course: The patient received IV fluids and multiple rounds of hydromorphone 2 mg IVP achieving partial relief. Workup showed a reticulocyte count of 8.3% and a hemoglobin level of 8.8 g/dL.  Review of Systems: As per HPI otherwise 10 point review of systems negative.    Past Medical History:  Diagnosis Date  . Anemia   . Depression, major, recurrent ([place])   . Migraines   . Sickle cell anemia (HCC)     Past Surgical History:  Procedure Laterality Date  . CESAREAN SECTION    . CHOLECYSTECTOMY  2000  . IR GENERIC HISTORICAL  10/08/2015   IR US GUIDE VASC ACCESS RIGHT 10/08/2015 Sandi Mariscal, MD [facility]-INTERV RAD  . IR GENERIC HISTORICAL  10/08/2015   IR FLUORO GUIDE CV LINE RIGHT 10/08/2015 Sandi Mariscal, MD [facility]INTER[facility]  . MULTIPLE TOOTH EXTRACTIONS N/A   . port a cath placement Right    about 6-7 years ago  . removal of porta cath Right 09/11/15  . TUBAL LIGATION       reports that she has been smoking.  She has never used smokeless tobacco. She reports that she does not drink alcohol or use drugs.  Allergies    Allergen Reactions  . Ultram [Tramadol] Other (See Comments)    seizures  . Zofran [Ondansetron Hcl] Nausea And Vomiting  . Buprenorphine Hcl Hives and Rash    Shaking Tolerates Percocet, Norco, and buprenorphine  . Morphine And Related Hives, Rash and Other (See Comments)    Shaking Tolerates Percocet, Norco, Dilaudid, and buprenorphine  . Tape Rash    Family History  Problem Relation Age of Onset  . Sickle cell trait Father   . Sickle cell trait Mother   . Sickle cell anemia Other     Prior to Admission medications   Medication Sig Start Date End Date Taking? Authorizing Provider  DULoxetine (CYMBALTA) 60 MG capsule Take 1 capsule (60 mg total) by mouth daily. 08/08/15  Yes Dorena Dew, FNP  folic acid (FOLVITE) 1 MG tablet Take 1 tablet (1 mg total) by mouth daily. 08/08/15  Yes Dorena Dew, FNP  morphine (MS CONTIN) 30 MG 12 hr tablet Take 1 tablet (30 mg total) by mouth every 12 (twelve) hours. 10/04/15  Yes Tresa Garter, MD  oxyCODONE-acetaminophen (PERCOCET) 10-325 MG tablet Take 1 tablet by mouth every 4 (four) hours as needed for pain. 10/04/15 10/10/15 Yes Olugbemiga Essie Christine, MD  hydroxyurea (HYDREA) 500 MG capsule TAKE 2 CAPSULES BY MOUTH DAILY. MAY TAKE WITH FOOD TO MINIMIZE GI SIDE EFFECTS. Patient taking differently: Take 500 mg by mouth daily. TAKE 2 CAPSULES BY MOUTH DAILY. MAY TAKE WITH FOOD TO MINIMIZE  GI SIDE EFFECTS. 08/08/15   Dorena Dew, FNP  ibuprofen (ADVIL,MOTRIN) 200 MG tablet Take 400 mg by mouth every 6 (six) hours as needed for moderate pain.     Historical Provider, MD  Topiramate ER 100 MG CP24 Take 100 mg by mouth at bedtime.    Historical Provider, MD    Physical Exam: Vitals:   10/08/15 1622 10/08/15 1808  BP: 115/89 106/75  Pulse: (!) 124 97  Resp: 14 17  Temp: 97.6 F (36.4 C) 98.5 F (36.9 C)  TempSrc: Oral Oral  SpO2:  96%  Weight: 56.7 kg (125 lb)   Height: 5' (1.524 m)       Constitutional: NAD, calm,  comfortable Vitals:   10/08/15 1622 10/08/15 1808  BP: 115/89 106/75  Pulse: (!) 124 97  Resp: 14 17  Temp: 97.6 F (36.4 C) 98.5 F (36.9 C)  TempSrc: Oral Oral  SpO2:  96%  Weight: 56.7 kg (125 lb)   Height: 5' (1.524 m)    Eyes: PERRL, lids and conjunctivae normal ENMT: Mucous membranes are moist. Posterior pharynx clear of any exudate or lesions.Normal dentition.  Neck: normal, supple, no masses, no thyromegaly Respiratory: clear to auscultation bilaterally, no wheezing, no crackles. Normal respiratory effort. No accessory muscle use.  Cardiovascular: Regular rate and rhythm, no murmurs / rubs / gallops. No extremity edema. 2+ pedal pulses. No carotid bruits.  Abdomen: BS positive. no tenderness, no masses palpated. No hepatosplenomegaly.  Musculoskeletal: no clubbing / cyanosis. No joint deformity upper and lower extremities. Good ROM, no contractures. Normal muscle tone.  Skin: no rashes, lesions, ulcers. No induration Neurologic: CN 2-12 grossly intact. Sensation intact, DTR normal. Strength 5/5 in all 4.  Psychiatric: Normal judgment and insight. Alert and oriented x 4. Normal mood.     Labs on Admission: I have personally reviewed following labs and imaging studies  CBC:  Recent Labs Lab 10/08/15 1747  WBC 14.9*  NEUTROABS 11.0*  HGB 8.8*  HCT 24.8*  MCV 100.8*  PLT 948   Basic Metabolic Panel:  Recent Labs Lab 10/08/15 1747  NA 138  K 3.3*  CL 109  CO2 20*  GLUCOSE 132*  BUN 9  CREATININE 0.44  CALCIUM 9.4   GFR: Estimated Creatinine Clearance: 80.4 mL/min (by C-G formula based on SCr of 0.8 mg/dL). Liver Function Tests:  Recent Labs Lab 10/08/15 1747  AST 50*  ALT 34  ALKPHOS 85  BILITOT 3.4*  PROT 8.5*  ALBUMIN 4.9   Anemia Panel:  Recent Labs  10/08/15 1747  RETICCTPCT 8.3*   Urine analysis:    Component Value Date/Time   COLORURINE AMBER (A) 09/16/2015 1032   APPEARANCEUR CLOUDY (A) 09/16/2015 1032   LABSPEC 1.015  09/16/2015 1032   PHURINE 6.0 09/16/2015 1032   GLUCOSEU NEGATIVE 09/16/2015 1032   HGBUR MODERATE (A) 09/16/2015 1032   BILIRUBINUR NEGATIVE 09/16/2015 1032   KETONESUR NEGATIVE 09/16/2015 1032   PROTEINUR 30 (A) 09/16/2015 1032   UROBILINOGEN 1.0 11/13/2014 1338   NITRITE POSITIVE (A) 09/16/2015 1032   LEUKOCYTESUR MODERATE (A) 09/16/2015 1032    Radiological Exams on Admission: Dg Chest 2 View  Result Date: 10/08/2015 CLINICAL DATA:  Sickle cell disease and complains of chest pain. Mild shortness of breath. EXAM: CHEST  2 VIEW COMPARISON:  09/02/2015 FINDINGS: Right subclavian Port-A-Cath has been removed and the patient now has a right jugular central line with the tip near the superior cavoatrial junction. There is a chronic catheter fragment in the  left innominate vein that is poorly characterized on this examination. Both lungs are clear without pulmonary edema or airspace disease. Heart and mediastinum are within normal limits. The trachea is midline. No pleural effusions. No acute bone abnormality. IMPRESSION: No active cardiopulmonary disease. Central line tip at the superior cavoatrial junction. Electronically Signed   By: Markus Daft M.D.   On: 10/08/2015 16:53   Ir Fluoro Guide Cv Line Right  Result Date: 10/08/2015 INDICATION: History of sickle cell disease, in need of durable intravenous access for frequent blood draws and medication administration. Note, patient has history of prior Port a catheter placement however recently had Port a catheter removed secondary to bacteremia. As such, request made made for placement of a tunneled central venous catheter in lieu of placement of a formal Port a Catheter. EXAM: TUNNELED PICC LINE WITH ULTRASOUND AND FLUOROSCOPIC GUIDANCE MEDICATIONS: None ANESTHESIA/SEDATION: None COMPLICATIONS: None immediate. PROCEDURE: Informed written consent was obtained from the patient after a discussion of the risks, benefits, and alternatives to treatment.  Questions regarding the procedure were encouraged and answered. The right neck and chest were prepped with chlorhexidine in a sterile fashion, and a sterile drape was applied covering the operative field. Maximum barrier sterile technique with sterile gowns and gloves were used for the procedure. A timeout was performed prior to the initiation of the procedure. After creating a small venotomy incision, a micropuncture kit was utilized to access the right internal jugular vein under direct, real-time ultrasound guidance after the overlying soft tissues were anesthetized with 1% lidocaine with epinephrine. Ultrasound image documentation was performed. The microwire was kinked to measure appropriate catheter length. The micropuncture sheath was exchanged for a peel-away sheath over a guidewire. A 5 French single lumen tunneled PICC measuring 20 cm was tunneled in a retrograde fashion from the anterior chest wall to the venotomy incision. The catheter was then placed through the peel-away sheath with tip ultimately positioned at the superior caval-atrial junction. Final catheter positioning was confirmed and documented with a spot radiographic image. The catheter aspirates and flushes normally. The catheter was flushed with appropriate volume heparin dwells. The catheter exit site was secured with a 0-Prolene retention suture. The venotomy incision was closed with an interrupted 4-0 Vicryl, Dermabond and Steri-strips. Dressings were applied. The patient tolerated the procedure well without immediate post procedural complication. FINDINGS: After catheter placement, the tip lies within the superior cavoatrial junction. The catheter aspirates and flushes normally and is ready for immediate use. Re- demonstrated abandoned catheter tubing overlying the expected location of the central aspect of the left innominate vein. IMPRESSION: Successful placement of 20 cm single lumen tunneled PICC catheter via the right internal  jugular vein with tip terminating at the superior caval atrial junction. The catheter is ready for immediate use. Electronically Signed   By: Sandi Mariscal M.D.   On: 10/08/2015 17:04   Ir US Guide Vasc Access Right  Result Date: 10/08/2015 INDICATION: History of sickle cell disease, in need of durable intravenous access for frequent blood draws and medication administration. Note, patient has history of prior Port a catheter placement however recently had Port a catheter removed secondary to bacteremia. As such, request made made for placement of a tunneled central venous catheter in lieu of placement of a formal Port a Catheter. EXAM: TUNNELED PICC LINE WITH ULTRASOUND AND FLUOROSCOPIC GUIDANCE MEDICATIONS: None ANESTHESIA/SEDATION: None COMPLICATIONS: None immediate. PROCEDURE: Informed written consent was obtained from the patient after a discussion of the risks, benefits, and alternatives to  treatment. Questions regarding the procedure were encouraged and answered. The right neck and chest were prepped with chlorhexidine in a sterile fashion, and a sterile drape was applied covering the operative field. Maximum barrier sterile technique with sterile gowns and gloves were used for the procedure. A timeout was performed prior to the initiation of the procedure. After creating a small venotomy incision, a micropuncture kit was utilized to access the right internal jugular vein under direct, real-time ultrasound guidance after the overlying soft tissues were anesthetized with 1% lidocaine with epinephrine. Ultrasound image documentation was performed. The microwire was kinked to measure appropriate catheter length. The micropuncture sheath was exchanged for a peel-away sheath over a guidewire. A 5 French single lumen tunneled PICC measuring 20 cm was tunneled in a retrograde fashion from the anterior chest wall to the venotomy incision. The catheter was then placed through the peel-away sheath with tip ultimately  positioned at the superior caval-atrial junction. Final catheter positioning was confirmed and documented with a spot radiographic image. The catheter aspirates and flushes normally. The catheter was flushed with appropriate volume heparin dwells. The catheter exit site was secured with a 0-Prolene retention suture. The venotomy incision was closed with an interrupted 4-0 Vicryl, Dermabond and Steri-strips. Dressings were applied. The patient tolerated the procedure well without immediate post procedural complication. FINDINGS: After catheter placement, the tip lies within the superior cavoatrial junction. The catheter aspirates and flushes normally and is ready for immediate use. Re- demonstrated abandoned catheter tubing overlying the expected location of the central aspect of the left innominate vein. IMPRESSION: Successful placement of 20 cm single lumen tunneled PICC catheter via the right internal jugular vein with tip terminating at the superior caval atrial junction. The catheter is ready for immediate use. Electronically Signed   By: Sandi Mariscal M.D.   On: 10/08/2015 17:04  Echocardiogram 03/19/2014 ------------------------------------------------------------------- LV EF: 55% -  60%  ------------------------------------------------------------------- Indications:   Pericardial effusion 423.9.  ------------------------------------------------------------------- History:  PMH: Sickle cell pain crisis. Angina pectoris.  ------------------------------------------------------------------- Study Conclusions  - Left ventricle: The cavity size was normal. Wall thickness was normal. Systolic function was normal. The estimated ejection fraction was in the range of 55% to 60%. Wall motion was normal; there were no regional wall motion abnormalities. Left ventricular diastolic function parameters were normal. - Pericardium, extracardiac: A trivial pericardial effusion  was identified posterior to the heart.  EKG: Independently reviewed. Vent. rate 113 BPM PR interval * ms QRS duration 84 ms QT/QTc 326/447 ms P-R-T axes 72 82 60 Sinus tachycardia Baseline wander in lead(s) II III aVF V3  Assessment/Plan Principal Problem:   Sickle cell pain crisis ([place]) Admit to telemetry/inpatient. Continue supplemental oxygen. Continue IV hydration. Continue MS Contin 30 mg by mouth twice a day. Continue analgesics as needed. Continue antiemetics as needed. Continue antihistamines as needed. Follow-up CBC and reticulocyte count in the morning.  Active Problems:   Chest pain Telemetry monitoring. Trend troponin levels. Check echocardiogram in the morning.    Depression Continue Cymbalta 60 mg by mouth daily.    Hypokalemia Replacing. Follow-up potassium level.    Chronic pain syndrome Continue MS Contin 30 mg by mouth twice a day. Hold oral  Oxycodone PCA  as needed for pain control.      Leukocytosis No symptoms or signs of infection. Likely due to stress. Monitor WBC.    DVT prophylaxis: Lovenox SQ. Code Status: Full code. Family Communication:  Disposition Plan: Admit for pain control, telemetry monitoring and troponin levels  trending. Consults called:  Admission status: Inpatient/telemetry   Reubin Milan MD [facility] Pager [phone].  If 7PM-7AM, please contact night-coverage [email or web address] Password [email or web address]  10/08/2015, 8:07 PM

## 2015-10-08 NOTE — ED Notes (Signed)
Patient states she has a picc line and does not want to be stuck

## 2015-10-08 NOTE — ED Triage Notes (Signed)
Pt with hx of sickle cell disease c/o pain in hips, legs, chest onset last night. Mild SOB. Pt reports she does not usually have chest pain with sickle cell crisis.

## 2015-10-08 NOTE — ED Notes (Signed)
REPORT GIVEN TO Barkley BrunsKRISTINE, RN 5E [phone]. [id number]. PT IN NO APPARENT DISTRESS. IVF INFUSING W/O PAIN OR SWELLING. THE OPPORTUNITY TO ASK QUESTIONS WAS PROVIDED. WILL TRANSPORT PT TO THE FLOOR.

## 2015-10-08 NOTE — ED Provider Notes (Signed)
[facility] DEPT Provider Note   CSN: [id number] Arrival date & time: 10/08/15  1621  First Provider Contact:  First MD Initiated Contact with Patient 10/08/15 1659        History   Chief Complaint Chief Complaint  Patient presents with  . Sickle Cell Pain Crisis  . Chest Pain    HPI Brittany Foster is a 30 y.o. female.  30 year old female with a history of sickle cell disease who presents with back and leg pain summary to her pain crisis. Has been compliant with her home meds. Also notes some chest tightness without cough fever congestion. Denies any vomiting or diarrhea. No leg pain or swelling. Does feel some palpitations. Symptoms have been present for 24 hours. Denies any pleuritic component to her symptoms. Denies any neurological deficits. Nothing makes her symptoms better      Past Medical History:  Diagnosis Date  . Anemia   . Depression, major, recurrent ([place])   . Migraines   . Sickle cell anemia [facility])     Patient Active Problem List   Diagnosis Date Noted  . Vasoocclusive sickle cell crisis ([place]) 08/21/2015  . Depression, major, recurrent ([place])   . Family planning, Depo-Provera contraception monitoring/administration 08/08/2015  . Sickle cell anemia with crisis ([place]) 07/12/2015  . Sickle cell anemia with pain ([place]) 05/28/2015  . Intractable pain   . Chest pain 03/07/2015  . Leukocytosis 03/07/2015  . Vitamin D deficiency 01/08/2015  . Chronic pain syndrome 01/08/2015  . Sickle cell crisis ([place]) 10/22/2014  . Hypokalemia 08/21/2014  . Depression 08/09/2014  . Hb-SS disease without crisis ([place]) 08/07/2014  . Sickle cell disease with crisis ([place]) 07/12/2014  . Hb-SS disease with crisis ([place]) 06/11/2014  . Pericardial effusion   . Anemia 02/09/2014  . Neuropathy ([place]) 02/09/2014  . Chronic headache disorder 02/09/2014  . Abnormal CT scan, chest   . Sickle cell pain crisis ([place]) 12/30/2013    Past Surgical History:  Procedure Laterality Date  .  CESAREAN SECTION    . CHOLECYSTECTOMY  2000  . IR GENERIC HISTORICAL  10/08/2015   IR US GUIDE VASC ACCESS RIGHT 10/08/2015 Sandi Mariscal, MD [facility]-INTERV RAD  . IR GENERIC HISTORICAL  10/08/2015   IR FLUORO GUIDE CV LINE RIGHT 10/08/2015 Sandi Mariscal, MD [facility]-INTERV RAD  . MULTIPLE TOOTH EXTRACTIONS N/A   . port a cath placement Right    about 6-7 years ago  . removal of porta cath Right 09/11/15  . TUBAL LIGATION      OB History    No data available       Home Medications    Prior to Admission medications   Medication Sig Start Date End Date Taking? Authorizing Provider  DULoxetine (CYMBALTA) 60 MG capsule Take 1 capsule (60 mg total) by mouth daily. 08/08/15  Yes Dorena Dew, FNP  folic acid (FOLVITE) 1 MG tablet Take 1 tablet (1 mg total) by mouth daily. 08/08/15  Yes Dorena Dew, FNP  morphine (MS CONTIN) 30 MG 12 hr tablet Take 1 tablet (30 mg total) by mouth every 12 (twelve) hours. 10/04/15  Yes Tresa Garter, MD  oxyCODONE-acetaminophen (PERCOCET) 10-325 MG tablet Take 1 tablet by mouth every 4 (four) hours as needed for pain. 10/04/15 10/10/15 Yes Olugbemiga Essie Christine, MD  hydroxyurea (HYDREA) 500 MG capsule TAKE 2 CAPSULES BY MOUTH DAILY. MAY TAKE WITH FOOD TO MINIMIZE GI SIDE EFFECTS. Patient taking differently: Take 500 mg by mouth daily. TAKE 2 CAPSULES BY MOUTH DAILY.  MAY TAKE WITH FOOD TO MINIMIZE GI SIDE EFFECTS. 08/08/15   [name], FNP  ibuprofen (ADVIL,MOTRIN) 200 MG tablet Take 400 mg by mouth every 6 (six) hours as needed for moderate pain.     Historical Provider, MD  Topiramate ER 100 MG CP24 Take 100 mg by mouth at bedtime.    Historical Provider, MD    Family History Family History  Problem Relation Age of Onset  . Sickle cell trait Father   . Sickle cell trait Mother   . Sickle cell anemia Other     Social History Social History  Substance Use Topics  . Smoking status: Current Some Day Smoker  . Smokeless tobacco: Never Used     Comment: 08/21/15 -  only smokes occasionally  . Alcohol use No     Allergies   Ultram [tramadol]; Zofran [ondansetron hcl]; Buprenorphine hcl; Morphine and related; and Tape   Review of Systems Review of Systems  All other systems reviewed and are negative.    Physical Exam Updated Vital Signs BP 115/89 (BP Location: Right Arm)   Pulse (!) 124   Temp 97.6 F (36.4 C) (Oral)   Resp 14   Ht 5' (1.524 m)   Wt 56.7 kg   LMP 08/01/2015 (Approximate) Comment: depo shot after last period  BMI 24.41 kg/m   Physical Exam  Constitutional: She is oriented to person, place, and time. She appears well-developed and well-nourished.  Non-toxic appearance. No distress.  HENT:  Head: Normocephalic and atraumatic.  Eyes: Conjunctivae, EOM and lids are normal. Pupils are equal, round, and reactive to light.  Neck: Normal range of motion. Neck supple. No tracheal deviation present. No thyroid mass present.  Cardiovascular: Regular rhythm and normal heart sounds.  Tachycardia present.  Exam reveals no gallop.   No murmur heard. Pulmonary/Chest: Effort normal and breath sounds normal. No stridor. No respiratory distress. She has no decreased breath sounds. She has no wheezes. She has no rhonchi. She has no rales.  Abdominal: Soft. Normal appearance and bowel sounds are normal. She exhibits no distension. There is no tenderness. There is no rebound and no CVA tenderness.  Musculoskeletal: Normal range of motion. She exhibits no edema or tenderness.  Neurological: She is alert and oriented to person, place, and time. She has normal strength. No cranial nerve deficit or sensory deficit. GCS eye subscore is 4. GCS verbal subscore is 5. GCS motor subscore is 6.  Skin: Skin is warm and dry. No abrasion and no rash noted.  Psychiatric: She has a normal mood and affect. Her speech is normal and behavior is normal.  Nursing note and vitals reviewed.    ED Treatments / Results  Labs (all labs ordered are listed, but  only abnormal results are displayed) Labs Reviewed  COMPREHENSIVE METABOLIC PANEL  CBC WITH DIFFERENTIAL/PLATELET  RETICULOCYTES  I-STAT TROPOININ, ED    EKG  EKG Interpretation None       Radiology Dg Chest 2 View  Result Date: 10/08/2015 CLINICAL DATA:  Sickle cell disease and complains of chest pain. Mild shortness of breath. EXAM: CHEST  2 VIEW COMPARISON:  09/02/2015 FINDINGS: Right subclavian Port-A-Cath has been removed and the patient now has a right jugular central line with the tip near the superior cavoatrial junction. There is a chronic catheter fragment in the left innominate vein that is poorly characterized on this examination. Both lungs are clear without pulmonary edema or airspace disease. Heart and mediastinum are within normal limits. The trachea  is midline. No pleural effusions. No acute bone abnormality. IMPRESSION: No active cardiopulmonary disease. Central line tip at the superior cavoatrial junction. Electronically Signed   By: Markus Daft M.D.   On: 10/08/2015 16:53   Ir Fluoro Guide Cv Line Right  Result Date: 10/08/2015 INDICATION: History of sickle cell disease, in need of durable intravenous access for frequent blood draws and medication administration. Note, patient has history of prior Port a catheter placement however recently had Port a catheter removed secondary to bacteremia. As such, request made made for placement of a tunneled central venous catheter in lieu of placement of a formal Port a Catheter. EXAM: TUNNELED PICC LINE WITH ULTRASOUND AND FLUOROSCOPIC GUIDANCE MEDICATIONS: None ANESTHESIA/SEDATION: None COMPLICATIONS: None immediate. PROCEDURE: Informed written consent was obtained from the patient after a discussion of the risks, benefits, and alternatives to treatment. Questions regarding the procedure were encouraged and answered. The right neck and chest were prepped with chlorhexidine in a sterile fashion, and a sterile drape was applied covering  the operative field. Maximum barrier sterile technique with sterile gowns and gloves were used for the procedure. A timeout was performed prior to the initiation of the procedure. After creating a small venotomy incision, a micropuncture kit was utilized to access the right internal jugular vein under direct, real-time ultrasound guidance after the overlying soft tissues were anesthetized with 1% lidocaine with epinephrine. Ultrasound image documentation was performed. The microwire was kinked to measure appropriate catheter length. The micropuncture sheath was exchanged for a peel-away sheath over a guidewire. A 5 French single lumen tunneled PICC measuring 20 cm was tunneled in a retrograde fashion from the anterior chest wall to the venotomy incision. The catheter was then placed through the peel-away sheath with tip ultimately positioned at the superior caval-atrial junction. Final catheter positioning was confirmed and documented with a spot radiographic image. The catheter aspirates and flushes normally. The catheter was flushed with appropriate volume heparin dwells. The catheter exit site was secured with a 0-Prolene retention suture. The venotomy incision was closed with an interrupted 4-0 Vicryl, Dermabond and Steri-strips. Dressings were applied. The patient tolerated the procedure well without immediate post procedural complication. FINDINGS: After catheter placement, the tip lies within the superior cavoatrial junction. The catheter aspirates and flushes normally and is ready for immediate use. Re- demonstrated abandoned catheter tubing overlying the expected location of the central aspect of the left innominate vein. IMPRESSION: Successful placement of 20 cm single lumen tunneled PICC catheter via the right internal jugular vein with tip terminating at the superior caval atrial junction. The catheter is ready for immediate use. Electronically Signed   By: Sandi Mariscal M.D.   On: 10/08/2015 17:04   Ir  US Guide Vasc Access Right  Result Date: 10/08/2015 INDICATION: History of sickle cell disease, in need of durable intravenous access for frequent blood draws and medication administration. Note, patient has history of prior Port a catheter placement however recently had Port a catheter removed secondary to bacteremia. As such, request made made for placement of a tunneled central venous catheter in lieu of placement of a formal Port a Catheter. EXAM: TUNNELED PICC LINE WITH ULTRASOUND AND FLUOROSCOPIC GUIDANCE MEDICATIONS: None ANESTHESIA/SEDATION: None COMPLICATIONS: None immediate. PROCEDURE: Informed written consent was obtained from the patient after a discussion of the risks, benefits, and alternatives to treatment. Questions regarding the procedure were encouraged and answered. The right neck and chest were prepped with chlorhexidine in a sterile fashion, and a sterile drape was applied covering  the operative field. Maximum barrier sterile technique with sterile gowns and gloves were used for the procedure. A timeout was performed prior to the initiation of the procedure. After creating a small venotomy incision, a micropuncture kit was utilized to access the right internal jugular vein under direct, real-time ultrasound guidance after the overlying soft tissues were anesthetized with 1% lidocaine with epinephrine. Ultrasound image documentation was performed. The microwire was kinked to measure appropriate catheter length. The micropuncture sheath was exchanged for a peel-away sheath over a guidewire. A 5 French single lumen tunneled PICC measuring 20 cm was tunneled in a retrograde fashion from the anterior chest wall to the venotomy incision. The catheter was then placed through the peel-away sheath with tip ultimately positioned at the superior caval-atrial junction. Final catheter positioning was confirmed and documented with a spot radiographic image. The catheter aspirates and flushes normally. The  catheter was flushed with appropriate volume heparin dwells. The catheter exit site was secured with a 0-Prolene retention suture. The venotomy incision was closed with an interrupted 4-0 Vicryl, Dermabond and Steri-strips. Dressings were applied. The patient tolerated the procedure well without immediate post procedural complication. FINDINGS: After catheter placement, the tip lies within the superior cavoatrial junction. The catheter aspirates and flushes normally and is ready for immediate use. Re- demonstrated abandoned catheter tubing overlying the expected location of the central aspect of the left innominate vein. IMPRESSION: Successful placement of 20 cm single lumen tunneled PICC catheter via the right internal jugular vein with tip terminating at the superior caval atrial junction. The catheter is ready for immediate use. Electronically Signed   By: Sandi Mariscal M.D.   On: 10/08/2015 17:04    Procedures Procedures (including critical care time)  Medications Ordered in ED Medications  0.45 % sodium chloride infusion (not administered)  HYDROmorphone (DILAUDID) injection 2 mg (not administered)    Followed by  HYDROmorphone (DILAUDID) injection 2 mg (not administered)  HYDROmorphone (DILAUDID) injection 2 mg (not administered)  promethazine (PHENERGAN) injection 25 mg (not administered)  diphenhydrAMINE (BENADRYL) capsule 25 mg (not administered)     Initial Impression / Assessment and Plan / ED Course  I have reviewed the triage vital signs and the nursing notes.  Pertinent labs & imaging results that were available during my care of the patient were reviewed by me and considered in my medical decision making (see chart for details).  Clinical Course    Patient given 3 doses of IV hydromorphone and remains with significant pain. Also given Toradol. Will be admitted to the hospitalist service  Final Clinical Impressions(s) / ED Diagnoses   Final diagnoses:  None    New  Prescriptions New Prescriptions   No medications on file     Lacretia Leigh, MD 10/08/15 1939

## 2015-10-08 NOTE — Procedures (Signed)
Successful placement of tunneled PICC catheter with tip terminating within the superior aspect of the right atrium.   EBL: None The patient tolerated the procedure well without immediate post procedural complication.  The catheter is ready for immediate use.   Katherina RightJay Isidora Laham, MD Pager #: [phone]

## 2015-10-09 DIAGNOSIS — G894 Chronic pain syndrome: Secondary | ICD-10-CM

## 2015-10-09 DIAGNOSIS — D5700 Hb-SS disease with crisis, unspecified: Principal | ICD-10-CM

## 2015-10-09 LAB — COMPREHENSIVE METABOLIC PANEL
ALT: 30 U/L (ref 14–54)
ANION GAP: 3 — AB (ref 5–15)
AST: 51 U/L — ABNORMAL HIGH (ref 15–41)
Albumin: 3.9 g/dL (ref 3.5–5.0)
Alkaline Phosphatase: 69 U/L (ref 38–126)
BUN: 10 mg/dL (ref 6–20)
CHLORIDE: 110 mmol/L (ref 101–111)
CO2: 23 mmol/L (ref 22–32)
CREATININE: 0.52 mg/dL (ref 0.44–1.00)
Calcium: 8.5 mg/dL — ABNORMAL LOW (ref 8.9–10.3)
Glucose, Bld: 111 mg/dL — ABNORMAL HIGH (ref 65–99)
Potassium: 3.4 mmol/L — ABNORMAL LOW (ref 3.5–5.1)
SODIUM: 136 mmol/L (ref 135–145)
Total Bilirubin: 1.8 mg/dL — ABNORMAL HIGH (ref 0.3–1.2)
Total Protein: 6.6 g/dL (ref 6.5–8.1)

## 2015-10-09 LAB — CBC
HCT: 20.4 % — ABNORMAL LOW (ref 36.0–46.0)
Hemoglobin: 7.3 g/dL — ABNORMAL LOW (ref 12.0–15.0)
MCH: 36.5 pg — AB (ref 26.0–34.0)
MCHC: 35.8 g/dL (ref 30.0–36.0)
MCV: 102 fL — AB (ref 78.0–100.0)
PLATELETS: 322 10*3/uL (ref 150–400)
RBC: 2 MIL/uL — AB (ref 3.87–5.11)
RDW: 17.9 % — AB (ref 11.5–15.5)
WBC: 14.3 10*3/uL — ABNORMAL HIGH (ref 4.0–10.5)

## 2015-10-09 LAB — TROPONIN I: Troponin I: <0.03 ng/mL (ref <0.03)

## 2015-10-09 LAB — RETICULOCYTES
RBC.: 2.02 MIL/uL — AB (ref 3.87–5.11)
RETIC CT PCT: 9.9 % — AB (ref 0.4–3.1)
Retic Count, Absolute: 200 10*3/uL — ABNORMAL HIGH (ref 19.0–186.0)

## 2015-10-09 MED ORDER — DIPHENHYDRAMINE HCL 25 MG PO CAPS
25.0000 mg | ORAL_CAPSULE | ORAL | Status: DC | PRN
  Administered 2015-10-09: 25 mg via ORAL
  Filled 2015-10-09 (×2): qty 1

## 2015-10-09 MED ORDER — SODIUM CHLORIDE 0.9% FLUSH: 10.0000 mL | INTRAVENOUS | Status: DC | PRN

## 2015-10-09 MED ORDER — HYDROMORPHONE HCL 2 MG/ML IJ SOLN
2.0000 mg | INTRAMUSCULAR | Status: DC | PRN
  Administered 2015-10-10 – 2015-10-13 (×20): 2 mg via INTRAVENOUS
  Filled 2015-10-09 (×18): qty 1

## 2015-10-09 MED ORDER — SODIUM CHLORIDE 0.9% FLUSH: 9.0000 mL | INTRAVENOUS | Status: DC | PRN

## 2015-10-09 MED ORDER — HYDROMORPHONE 1 MG/ML IV SOLN: INTRAVENOUS | Status: DC

## 2015-10-09 MED ORDER — HYDROMORPHONE HCL 2 MG/ML IJ SOLN
2.0000 mg | INTRAMUSCULAR | Status: AC
  Administered 2015-10-09 – 2015-10-10 (×6): 2 mg via INTRAVENOUS
  Filled 2015-10-09 (×2): qty 1

## 2015-10-09 MED ORDER — HYDROXYZINE HCL 25 MG PO TABS
25.0000 mg | ORAL_TABLET | Freq: Once | ORAL | Status: AC
  Administered 2015-10-09: 25 mg via ORAL
  Filled 2015-10-09: qty 1

## 2015-10-09 MED ORDER — HYDROMORPHONE 1 MG/ML IV SOLN
INTRAVENOUS | Status: DC
  Administered 2015-10-09: 10.6 mg via INTRAVENOUS
  Administered 2015-10-09: 6.4 mg via INTRAVENOUS
  Administered 2015-10-09: 13:00:00 via INTRAVENOUS
  Administered 2015-10-09: 8.6 mg via INTRAVENOUS
  Administered 2015-10-09: 20:00:00 via INTRAVENOUS
  Administered 2015-10-09: 11.36 mg via INTRAVENOUS
  Administered 2015-10-10: 20 mg via INTRAVENOUS
  Administered 2015-10-10 (×2): via INTRAVENOUS
  Administered 2015-10-10: 12.6 mg via INTRAVENOUS
  Administered 2015-10-10: 16 mg via INTRAVENOUS
  Administered 2015-10-10: 13.8 mg via INTRAVENOUS
  Administered 2015-10-10: 11.6 mg via INTRAVENOUS
  Administered 2015-10-10 – 2015-10-11 (×2): via INTRAVENOUS
  Administered 2015-10-11: 8.4 mg via INTRAVENOUS
  Administered 2015-10-11: 13:00:00 via INTRAVENOUS
  Administered 2015-10-11: 10.2 mg via INTRAVENOUS
  Administered 2015-10-11: 0.6 mg via INTRAVENOUS
  Administered 2015-10-11: 3 mg via INTRAVENOUS
  Administered 2015-10-12: 16:00:00 via INTRAVENOUS
  Administered 2015-10-12: 7.2 mg via INTRAVENOUS
  Administered 2015-10-12: 8.4 mg via INTRAVENOUS
  Administered 2015-10-12: 10.8 mg via INTRAVENOUS
  Administered 2015-10-12: 7.2 mg via INTRAVENOUS
  Administered 2015-10-12: 3.6 mg via INTRAVENOUS
  Administered 2015-10-13: 13.2 mg via INTRAVENOUS
  Administered 2015-10-13: 15 mg via INTRAVENOUS
  Administered 2015-10-13 (×2): 7.2 mg via INTRAVENOUS
  Filled 2015-10-09 (×9): qty 25

## 2015-10-09 MED ORDER — NALOXONE HCL 0.4 MG/ML IJ SOLN: 0.4000 mg | INTRAMUSCULAR | Status: DC | PRN

## 2015-10-09 NOTE — Progress Notes (Signed)
  Pt appears to be disappointed about coming to [facility] [address], she would rather be on [address]. Has a flat affect and gives one word responses when asked a questions. She appeared to be upset, about her PCA locking her out every 8 minutes. She feels as though it could be the PCA, pump itself. She demands to speak with the Charge RN Kathie RhodesBetty, who explains along with her Primary RN way the machines locks her out every 8 minutes. I have placed an order for a new pump at this time. Will continue to monitor.

## 2015-10-09 NOTE — Progress Notes (Signed)
SICKLE CELL SERVICE PROGRESS NOTE  Brittany Foster OZD:[id number] DOB: Jul 14, 1985 DOA: 10/08/2015 PCP: Angelica Chessman, MD  Assessment/Plan: Principal Problem:   Sickle cell pain crisis ([place]) Active Problems:   Depression   Hypokalemia   Chronic pain syndrome   Chest pain   Leukocytosis  1. Hb SS with crisis: Pt reports pain at intensity of 9/10 localized to hips, legs and chest. She has used 12.2 mg of Dilaudid in the last 91/2 hours. Will continue PCA at adjusted dose, schedule clinician assisted doses and Toradol. Will re-assess in the morning.  2. Leukocytosis: Pt has a mild leukocytosis without any signs of infection. Likely related to crisis. Will continue to monitor. 3. Anemia of Chronic disease: Hb stable.  4. Chronic pain: Continue MS Contin. Continue Hydrea. Will check reiculocyte counts.  5. Medication non-compliance  Code Status: Full Code Family Communication: N/A Disposition Plan: Not yet ready for discharge  [place].  Pager [phone]. If 7PM-7AM, please contact night-coverage.  10/09/2015, 11:42 AM  LOS: 1 day   Interim History: Pt reports pain at intensity of 9/10 localized to hips, legs and chest. She has used 12.2 mg of Dilaudid in the last 91/2 hours.  Consultants:  None  Procedures:  None  Antibiotics:  None   Objective: Vitals:   10/09/15 0430 10/09/15 0813 10/09/15 1012 10/09/15 1019  BP: (!) 95/49   (!) 100/53  Pulse: 82   93  Resp: '18 18 16 15  '$ Temp: 98.6 F (37 C)   99 F (37.2 C)  TempSrc: Axillary   Oral  SpO2: 99% 99% 100% 100%  Weight:      Height:       Weight change:  No intake or output data in the 24 hours ending 10/09/15 1142  General: Alert, awake, oriented x3, in mild distress secondary to pain.  HEENT: /AT PEERL, EOMI, anicteric Neck: Trachea midline,  no masses, no thyromegal,y no JVD, no carotid bruit OROPHARYNX:  Moist, No exudate/ erythema/lesions.  Heart: Regular rate and rhythm, without murmurs,  rubs, gallops, PMI non-displaced, no heaves or thrills on palpation.  Lungs: Clear to auscultation, no wheezing or rhonchi noted. No increased vocal fremitus resonant to percussion  Abdomen: Soft, nontender, nondistended, positive bowel sounds, no masses no hepatosplenomegaly noted.  Neuro: No focal neurological deficits noted cranial nerves II through XII grossly intact. Strength at functional baseline in bilateral upper and lower extremities. Musculoskeletal: No warm swelling or erythema around joints, no spinal tenderness noted. Psychiatric: Patient alert and oriented x3, good insight and cognition, good recent to remote recall.    Data Reviewed: Basic Metabolic Panel:  Recent Labs Lab 10/08/15 1747 10/09/15 0400  NA 138 136  K 3.3* 3.4*  CL 109 110  CO2 20* 23  GLUCOSE 132* 111*  BUN 9 10  CREATININE 0.44 0.52  CALCIUM 9.4 8.5*   Liver Function Tests:  Recent Labs Lab 10/08/15 1747 10/09/15 0400  AST 50* 51*  ALT 34 30  ALKPHOS 85 69  BILITOT 3.4* 1.8*  PROT 8.5* 6.6  ALBUMIN 4.9 3.9   No results for input(s): LIPASE, AMYLASE in the last 168 hours. No results for input(s): AMMONIA in the last 168 hours. CBC:  Recent Labs Lab 10/08/15 1747 10/09/15 0400  WBC 14.9* 14.3*  NEUTROABS 11.0*  --   HGB 8.8* 7.3*  HCT 24.8* 20.4*  MCV 100.8* 102.0*  PLT 307 322   Cardiac Enzymes:  Recent Labs Lab 10/09/15 0025 10/09/15 0400  TROPONINI <0.03 <0.03  BNP (last 3 results) No results for input(s): BNP in the last 8760 hours.  ProBNP (last 3 results) No results for input(s): PROBNP in the last 8760 hours.  CBG: No results for input(s): GLUCAP in the last 168 hours.  No results found for this or any previous visit (from the past 240 hour(s)).   Studies: Dg Chest 2 View  Result Date: 10/08/2015 CLINICAL DATA:  Sickle cell disease and complains of chest pain. Mild shortness of breath. EXAM: CHEST  2 VIEW COMPARISON:  09/02/2015 FINDINGS: Right subclavian  Port-A-Cath has been removed and the patient now has a right jugular central line with the tip near the superior cavoatrial junction. There is a chronic catheter fragment in the left innominate vein that is poorly characterized on this examination. Both lungs are clear without pulmonary edema or airspace disease. Heart and mediastinum are within normal limits. The trachea is midline. No pleural effusions. No acute bone abnormality. IMPRESSION: No active cardiopulmonary disease. Central line tip at the superior cavoatrial junction. Electronically Signed   By: Markus Daft M.D.   On: 10/08/2015 16:53   Ir Fluoro Guide Cv Line Right  Result Date: 10/08/2015 INDICATION: History of sickle cell disease, in need of durable intravenous access for frequent blood draws and medication administration. Note, patient has history of prior Port a catheter placement however recently had Port a catheter removed secondary to bacteremia. As such, request made made for placement of a tunneled central venous catheter in lieu of placement of a formal Port a Catheter. EXAM: TUNNELED PICC LINE WITH ULTRASOUND AND FLUOROSCOPIC GUIDANCE MEDICATIONS: None ANESTHESIA/SEDATION: None COMPLICATIONS: None immediate. PROCEDURE: Informed written consent was obtained from the patient after a discussion of the risks, benefits, and alternatives to treatment. Questions regarding the procedure were encouraged and answered. The right neck and chest were prepped with chlorhexidine in a sterile fashion, and a sterile drape was applied covering the operative field. Maximum barrier sterile technique with sterile gowns and gloves were used for the procedure. A timeout was performed prior to the initiation of the procedure. After creating a small venotomy incision, a micropuncture kit was utilized to access the right internal jugular vein under direct, real-time ultrasound guidance after the overlying soft tissues were anesthetized with 1% lidocaine with  epinephrine. Ultrasound image documentation was performed. The microwire was kinked to measure appropriate catheter length. The micropuncture sheath was exchanged for a peel-away sheath over a guidewire. A 5 French single lumen tunneled PICC measuring 20 cm was tunneled in a retrograde fashion from the anterior chest wall to the venotomy incision. The catheter was then placed through the peel-away sheath with tip ultimately positioned at the superior caval-atrial junction. Final catheter positioning was confirmed and documented with a spot radiographic image. The catheter aspirates and flushes normally. The catheter was flushed with appropriate volume heparin dwells. The catheter exit site was secured with a 0-Prolene retention suture. The venotomy incision was closed with an interrupted 4-0 Vicryl, Dermabond and Steri-strips. Dressings were applied. The patient tolerated the procedure well without immediate post procedural complication. FINDINGS: After catheter placement, the tip lies within the superior cavoatrial junction. The catheter aspirates and flushes normally and is ready for immediate use. Re- demonstrated abandoned catheter tubing overlying the expected location of the central aspect of the left innominate vein. IMPRESSION: Successful placement of 20 cm single lumen tunneled PICC catheter via the right internal jugular vein with tip terminating at the superior caval atrial junction. The catheter is ready for immediate use.  Electronically Signed   By: Sandi Mariscal M.D.   On: 10/08/2015 17:04   Ir US Guide Vasc Access Right  Result Date: 10/08/2015 INDICATION: History of sickle cell disease, in need of durable intravenous access for frequent blood draws and medication administration. Note, patient has history of prior Port a catheter placement however recently had Port a catheter removed secondary to bacteremia. As such, request made made for placement of a tunneled central venous catheter in lieu of  placement of a formal Port a Catheter. EXAM: TUNNELED PICC LINE WITH ULTRASOUND AND FLUOROSCOPIC GUIDANCE MEDICATIONS: None ANESTHESIA/SEDATION: None COMPLICATIONS: None immediate. PROCEDURE: Informed written consent was obtained from the patient after a discussion of the risks, benefits, and alternatives to treatment. Questions regarding the procedure were encouraged and answered. The right neck and chest were prepped with chlorhexidine in a sterile fashion, and a sterile drape was applied covering the operative field. Maximum barrier sterile technique with sterile gowns and gloves were used for the procedure. A timeout was performed prior to the initiation of the procedure. After creating a small venotomy incision, a micropuncture kit was utilized to access the right internal jugular vein under direct, real-time ultrasound guidance after the overlying soft tissues were anesthetized with 1% lidocaine with epinephrine. Ultrasound image documentation was performed. The microwire was kinked to measure appropriate catheter length. The micropuncture sheath was exchanged for a peel-away sheath over a guidewire. A 5 French single lumen tunneled PICC measuring 20 cm was tunneled in a retrograde fashion from the anterior chest wall to the venotomy incision. The catheter was then placed through the peel-away sheath with tip ultimately positioned at the superior caval-atrial junction. Final catheter positioning was confirmed and documented with a spot radiographic image. The catheter aspirates and flushes normally. The catheter was flushed with appropriate volume heparin dwells. The catheter exit site was secured with a 0-Prolene retention suture. The venotomy incision was closed with an interrupted 4-0 Vicryl, Dermabond and Steri-strips. Dressings were applied. The patient tolerated the procedure well without immediate post procedural complication. FINDINGS: After catheter placement, the tip lies within the superior  cavoatrial junction. The catheter aspirates and flushes normally and is ready for immediate use. Re- demonstrated abandoned catheter tubing overlying the expected location of the central aspect of the left innominate vein. IMPRESSION: Successful placement of 20 cm single lumen tunneled PICC catheter via the right internal jugular vein with tip terminating at the superior caval atrial junction. The catheter is ready for immediate use. Electronically Signed   By: Sandi Mariscal M.D.   On: 10/08/2015 17:04    Scheduled Meds: . enoxaparin (LOVENOX) injection  40 mg Subcutaneous Q24H  . HYDROmorphone   Intravenous Q4H  .  HYDROmorphone (DILAUDID) injection  2 mg Intravenous Q3H  . ketorolac  30 mg Intravenous Q6H  . senna-docusate  1 tablet Oral BID  . sodium chloride flush  3 mL Intravenous Q12H   Continuous Infusions: . sodium chloride 150 mL/hr at 10/09/15 0618    Principal Problem:   Sickle cell pain crisis ([facility]) Active Problems:   Depression   Hypokalemia   Chronic pain syndrome   Chest pain   Leukocytosis        In excess of 25 minutes spent during this visit. Greater than 50% involved face to face contact with the patient for assessment, counseling and coordination of care.

## 2015-10-10 DIAGNOSIS — F329 Major depressive disorder, single episode, unspecified: Secondary | ICD-10-CM

## 2015-10-10 DIAGNOSIS — E876 Hypokalemia: Secondary | ICD-10-CM

## 2015-10-10 LAB — COMPREHENSIVE METABOLIC PANEL
ALBUMIN: 3.6 g/dL (ref 3.5–5.0)
ALT: 24 U/L (ref 14–54)
ANION GAP: 3 — AB (ref 5–15)
AST: 39 U/L (ref 15–41)
Alkaline Phosphatase: 69 U/L (ref 38–126)
BILIRUBIN TOTAL: 1.7 mg/dL — AB (ref 0.3–1.2)
BUN: 7 mg/dL (ref 6–20)
CHLORIDE: 113 mmol/L — AB (ref 101–111)
CO2: 23 mmol/L (ref 22–32)
Calcium: 8.1 mg/dL — ABNORMAL LOW (ref 8.9–10.3)
Creatinine, Ser: 0.41 mg/dL — ABNORMAL LOW (ref 0.44–1.00)
GFR calc Af Amer: >60 mL/min (ref >60)
GFR calc non Af Amer: >60 mL/min (ref >60)
GLUCOSE: 124 mg/dL — AB (ref 65–99)
POTASSIUM: 3.4 mmol/L — AB (ref 3.5–5.1)
SODIUM: 139 mmol/L (ref 135–145)
TOTAL PROTEIN: 6.1 g/dL — AB (ref 6.5–8.1)

## 2015-10-10 MED ORDER — TOPIRAMATE 100 MG PO TABS
100.0000 mg | ORAL_TABLET | Freq: Every day | ORAL | Status: DC
  Administered 2015-10-10 – 2015-10-12 (×3): 100 mg via ORAL
  Filled 2015-10-10 (×3): qty 1

## 2015-10-10 MED ORDER — POTASSIUM CHLORIDE CRYS ER 20 MEQ PO TBCR
40.0000 meq | EXTENDED_RELEASE_TABLET | Freq: Two times a day (BID) | ORAL | Status: DC
  Administered 2015-10-10 – 2015-10-12 (×5): 40 meq via ORAL
  Filled 2015-10-10 (×6): qty 2

## 2015-10-10 MED ORDER — FOLIC ACID 1 MG PO TABS
1.0000 mg | ORAL_TABLET | Freq: Every day | ORAL | Status: DC
  Administered 2015-10-10 – 2015-10-13 (×4): 1 mg via ORAL
  Filled 2015-10-10 (×4): qty 1

## 2015-10-10 MED ORDER — MORPHINE SULFATE ER 30 MG PO TBCR
30.0000 mg | EXTENDED_RELEASE_TABLET | Freq: Two times a day (BID) | ORAL | Status: DC
  Administered 2015-10-10 – 2015-10-13 (×7): 30 mg via ORAL
  Filled 2015-10-10 (×7): qty 1

## 2015-10-10 MED ORDER — MORPHINE SULFATE ER 30 MG PO TBCR: 30.0000 mg | EXTENDED_RELEASE_TABLET | Freq: Two times a day (BID) | ORAL | Status: DC

## 2015-10-10 MED ORDER — DULOXETINE HCL 30 MG PO CPEP
60.0000 mg | ORAL_CAPSULE | Freq: Every day | ORAL | Status: DC
  Administered 2015-10-10 – 2015-10-13 (×4): 60 mg via ORAL
  Filled 2015-10-10 (×4): qty 2

## 2015-10-10 MED ORDER — DIPHENHYDRAMINE HCL 50 MG PO CAPS
50.0000 mg | ORAL_CAPSULE | ORAL | Status: DC | PRN
  Administered 2015-10-10: 50 mg via ORAL
  Filled 2015-10-10: qty 1

## 2015-10-10 MED ORDER — HYDROXYUREA 500 MG PO CAPS
500.0000 mg | ORAL_CAPSULE | Freq: Every day | ORAL | Status: DC
  Administered 2015-10-10 – 2015-10-13 (×4): 500 mg via ORAL
  Filled 2015-10-10 (×7): qty 1

## 2015-10-10 NOTE — Progress Notes (Signed)
SICKLE CELL SERVICE PROGRESS NOTE  Brittany Foster RDE:[id number] DOB: [birth year] DOA: 10/08/2015 PCP: Angelica Chessman, MD  Assessment/Plan: Principal Problem:   Sickle cell pain crisis ([place]) Active Problems:   Depression   Hypokalemia   Chronic pain syndrome   Chest pain   Leukocytosis  1. Hb SS with crisis:Will increase PCA to 0.7 mg, continue clinician assisted doses on PRN basis and continue Toradol. Will re-assess pain control in 4 hours.  2. Hypokalemia: Replace orally. 3. Leukocytosis: Pt has a mild leukocytosis without any signs of infection. Likely related to crisis. Will continue to monitor. 4. Anemia of Chronic disease: Hb stable.  5. Chronic pain: Continue MS Contin. Continue Hydrea. Will check reiculocyte counts.  6. Mood Disorder: Pt takes Topamax and Cymbalta for mood disorder. Continue.  7. Headaches: Continue Topamax ER.   Code Status: Full Code Family Communication: N/A Disposition Plan: Not yet ready for discharge  [place].  Pager [phone]. If 7PM-7AM, please contact night-coverage.  10/10/2015, 11:08 AM  LOS: 2 days   Interim History: Pt frustrated stating that she has not received any of her home medications since admission. Medications ordered at time of admission. She reports pain at intensity of 9/10 localized to hips, legs and chest. She has used 53.6 mg of Dilaudid with 110/86:demands/deliveries in the last 24hours. She is frustrated that she was unable to receive clinician assisted doses of Dilaudid. However medication ordered and patient states that she did not understand that she had to ask for it. Pt reports that she had a BM last night. Pt states that she gets "schaffing" from pain medications and so she needs IV benadryl. No itching noted throughout visit.  Consultants:  None  Procedures:  None  Antibiotics:  None   Objective: Vitals:   10/10/15 0259 10/10/15 0334 10/10/15 0629 10/10/15 0832  BP: 103/61  (!) 91/50   Pulse: 76   74   Resp: '10 10 13 12  '$ Temp: 98.5 F (36.9 C)  98.3 F (36.8 C)   TempSrc: Oral  Oral   SpO2: 98% 98% 97% 97%  Weight:      Height:       Weight change:   Intake/Output Summary (Last 24 hours) at 10/10/15 1108 Last data filed at 10/09/15 1700  Gross per 24 hour  Intake          1776.67 ml  Output                0 ml  Net          1776.67 ml    General: Alert, awake, oriented x3, in mild distress secondary to pain.  HEENT: [place]/AT PEERL, EOMI, anicteric Neck: Trachea midline,  no masses, no thyromegal,y no JVD, no carotid bruit OROPHARYNX:  Moist, No exudate/ erythema/lesions.  Heart: Regular rate and rhythm, without murmurs, rubs, gallops, PMI non-displaced, no heaves or thrills on palpation.  Lungs: Clear to auscultation, no wheezing or rhonchi noted. No increased vocal fremitus resonant to percussion  Abdomen: Soft, nontender, nondistended, positive bowel sounds, no masses no hepatosplenomegaly noted.  Neuro: No focal neurological deficits noted cranial nerves II through XII grossly intact. Strength at functional baseline in bilateral upper and lower extremities. Musculoskeletal: No warmth swelling or erythema around joints, no spinal tenderness noted. Skin: No rashes or wheals noted on skin. No scratch marks noted.  Psychiatric: Patient alert and oriented x3, tearful and appears frustrated.    Data Reviewed: Basic Metabolic Panel:  Recent Labs Lab 10/08/15 1747 10/09/15  0400 10/10/15 0445  NA 138 136 139  K 3.3* 3.4* 3.4*  CL 109 110 113*  CO2 20* 23 23  GLUCOSE 132* 111* 124*  BUN '9 10 7  '$ CREATININE 0.44 0.52 0.41*  CALCIUM 9.4 8.5* 8.1*   Liver Function Tests:  Recent Labs Lab 10/08/15 1747 10/09/15 0400 10/10/15 0445  AST 50* 51* 39  ALT 34 30 24  ALKPHOS 85 69 69  BILITOT 3.4* 1.8* 1.7*  PROT 8.5* 6.6 6.1*  ALBUMIN 4.9 3.9 3.6   No results for input(s): LIPASE, AMYLASE in the last 168 hours. No results for input(s): AMMONIA in the last 168  hours. CBC:  Recent Labs Lab 10/08/15 1747 10/09/15 0400  WBC 14.9* 14.3*  NEUTROABS 11.0*  --   HGB 8.8* 7.3*  HCT 24.8* 20.4*  MCV 100.8* 102.0*  PLT 307 322   Cardiac Enzymes:  Recent Labs Lab 10/09/15 0025 10/09/15 0400  TROPONINI <0.03 <0.03   BNP (last 3 results) No results for input(s): BNP in the last 8760 hours.  ProBNP (last 3 results) No results for input(s): PROBNP in the last 8760 hours.  CBG: No results for input(s): GLUCAP in the last 168 hours.  No results found for this or any previous visit (from the past 240 hour(s)).   Studies: Dg Chest 2 View  Result Date: 10/08/2015 CLINICAL DATA:  Sickle cell disease and complains of chest pain. Mild shortness of breath. EXAM: CHEST  2 VIEW COMPARISON:  09/02/2015 FINDINGS: Right subclavian Port-A-Cath has been removed and the patient now has a right jugular central line with the tip near the superior cavoatrial junction. There is a chronic catheter fragment in the left innominate vein that is poorly characterized on this examination. Both lungs are clear without pulmonary edema or airspace disease. Heart and mediastinum are within normal limits. The trachea is midline. No pleural effusions. No acute bone abnormality. IMPRESSION: No active cardiopulmonary disease. Central line tip at the superior cavoatrial junction. Electronically Signed   By: Markus Daft M.D.   On: 10/08/2015 16:53   Ir Fluoro Guide Cv Line Right  Result Date: 10/08/2015 INDICATION: History of sickle cell disease, in need of durable intravenous access for frequent blood draws and medication administration. Note, patient has history of prior Port a catheter placement however recently had Port a catheter removed secondary to bacteremia. As such, request made made for placement of a tunneled central venous catheter in lieu of placement of a formal Port a Catheter. EXAM: TUNNELED PICC LINE WITH ULTRASOUND AND FLUOROSCOPIC GUIDANCE MEDICATIONS: None  ANESTHESIA/SEDATION: None COMPLICATIONS: None immediate. PROCEDURE: Informed written consent was obtained from the patient after a discussion of the risks, benefits, and alternatives to treatment. Questions regarding the procedure were encouraged and answered. The right neck and chest were prepped with chlorhexidine in a sterile fashion, and a sterile drape was applied covering the operative field. Maximum barrier sterile technique with sterile gowns and gloves were used for the procedure. A timeout was performed prior to the initiation of the procedure. After creating a small venotomy incision, a micropuncture kit was utilized to access the right internal jugular vein under direct, real-time ultrasound guidance after the overlying soft tissues were anesthetized with 1% lidocaine with epinephrine. Ultrasound image documentation was performed. The microwire was kinked to measure appropriate catheter length. The micropuncture sheath was exchanged for a peel-away sheath over a guidewire. A 5 French single lumen tunneled PICC measuring 20 cm was tunneled in a retrograde fashion from the anterior  chest wall to the venotomy incision. The catheter was then placed through the peel-away sheath with tip ultimately positioned at the superior caval-atrial junction. Final catheter positioning was confirmed and documented with a spot radiographic image. The catheter aspirates and flushes normally. The catheter was flushed with appropriate volume heparin dwells. The catheter exit site was secured with a 0-Prolene retention suture. The venotomy incision was closed with an interrupted 4-0 Vicryl, Dermabond and Steri-strips. Dressings were applied. The patient tolerated the procedure well without immediate post procedural complication. FINDINGS: After catheter placement, the tip lies within the superior cavoatrial junction. The catheter aspirates and flushes normally and is ready for immediate use. Re- demonstrated abandoned catheter  tubing overlying the expected location of the central aspect of the left innominate vein. IMPRESSION: Successful placement of 20 cm single lumen tunneled PICC catheter via the right internal jugular vein with tip terminating at the superior caval atrial junction. The catheter is ready for immediate use. Electronically Signed   By: Sandi Mariscal M.D.   On: 10/08/2015 17:04   Ir US Guide Vasc Access Right  Result Date: 10/08/2015 INDICATION: History of sickle cell disease, in need of durable intravenous access for frequent blood draws and medication administration. Note, patient has history of prior Port a catheter placement however recently had Port a catheter removed secondary to bacteremia. As such, request made made for placement of a tunneled central venous catheter in lieu of placement of a formal Port a Catheter. EXAM: TUNNELED PICC LINE WITH ULTRASOUND AND FLUOROSCOPIC GUIDANCE MEDICATIONS: None ANESTHESIA/SEDATION: None COMPLICATIONS: None immediate. PROCEDURE: Informed written consent was obtained from the patient after a discussion of the risks, benefits, and alternatives to treatment. Questions regarding the procedure were encouraged and answered. The right neck and chest were prepped with chlorhexidine in a sterile fashion, and a sterile drape was applied covering the operative field. Maximum barrier sterile technique with sterile gowns and gloves were used for the procedure. A timeout was performed prior to the initiation of the procedure. After creating a small venotomy incision, a micropuncture kit was utilized to access the right internal jugular vein under direct, real-time ultrasound guidance after the overlying soft tissues were anesthetized with 1% lidocaine with epinephrine. Ultrasound image documentation was performed. The microwire was kinked to measure appropriate catheter length. The micropuncture sheath was exchanged for a peel-away sheath over a guidewire. A 5 French single lumen tunneled  PICC measuring 20 cm was tunneled in a retrograde fashion from the anterior chest wall to the venotomy incision. The catheter was then placed through the peel-away sheath with tip ultimately positioned at the superior caval-atrial junction. Final catheter positioning was confirmed and documented with a spot radiographic image. The catheter aspirates and flushes normally. The catheter was flushed with appropriate volume heparin dwells. The catheter exit site was secured with a 0-Prolene retention suture. The venotomy incision was closed with an interrupted 4-0 Vicryl, Dermabond and Steri-strips. Dressings were applied. The patient tolerated the procedure well without immediate post procedural complication. FINDINGS: After catheter placement, the tip lies within the superior cavoatrial junction. The catheter aspirates and flushes normally and is ready for immediate use. Re- demonstrated abandoned catheter tubing overlying the expected location of the central aspect of the left innominate vein. IMPRESSION: Successful placement of 20 cm single lumen tunneled PICC catheter via the right internal jugular vein with tip terminating at the superior caval atrial junction. The catheter is ready for immediate use. Electronically Signed   By: Eldridge Abrahams.D.  On: 10/08/2015 17:04    Scheduled Meds: . enoxaparin (LOVENOX) injection  40 mg Subcutaneous Q24H  . HYDROmorphone   Intravenous Q4H  . ketorolac  30 mg Intravenous Q6H  . morphine  30 mg Oral Q12H  . senna-docusate  1 tablet Oral BID  . sodium chloride flush  3 mL Intravenous Q12H   Continuous Infusions: . sodium chloride 100 mL/hr at 10/10/15 0831    In excess of 25 minutes spent during this visit. Greater than 50% involved face to face contact with the patient for assessment, counseling and coordination of care.

## 2015-10-10 NOTE — Progress Notes (Signed)
Patient very unhappy with her medications at this time. Patient wants to have IV benadryl to relieve itching. Patient has order for PO benadryl 25 mg. Patient states "the pills do nothing for me, I need the IV benadryl". This RN offered PO benadryl patient refused. At shift change previous RN notified physician on call for new PRN order, one time order for PO hydroxyzine written. Patient given medication with no changes.

## 2015-10-11 DIAGNOSIS — D72829 Elevated white blood cell count, unspecified: Secondary | ICD-10-CM

## 2015-10-11 DIAGNOSIS — D638 Anemia in other chronic diseases classified elsewhere: Secondary | ICD-10-CM

## 2015-10-11 LAB — CBC WITH DIFFERENTIAL/PLATELET
BASOS ABS: 0.1 10*3/uL (ref 0.0–0.1)
Basophils Relative: 1 %
Eosinophils Absolute: 0.3 10*3/uL (ref 0.0–0.7)
Eosinophils Relative: 2 %
HEMATOCRIT: 22.3 % — AB (ref 36.0–46.0)
HEMOGLOBIN: 7.8 g/dL — AB (ref 12.0–15.0)
LYMPHS PCT: 31 %
Lymphs Abs: 4.2 10*3/uL — ABNORMAL HIGH (ref 0.7–4.0)
MCH: 36.1 pg — ABNORMAL HIGH (ref 26.0–34.0)
MCHC: 35 g/dL (ref 30.0–36.0)
MCV: 103.2 fL — ABNORMAL HIGH (ref 78.0–100.0)
MONOS PCT: 6 %
Monocytes Absolute: 0.8 10*3/uL (ref 0.1–1.0)
NEUTROS ABS: 8.1 10*3/uL — AB (ref 1.7–7.7)
NEUTROS PCT: 60 %
Platelets: 395 10*3/uL (ref 150–400)
RBC: 2.16 MIL/uL — AB (ref 3.87–5.11)
RDW: 22 % — ABNORMAL HIGH (ref 11.5–15.5)
WBC: 13.5 10*3/uL — AB (ref 4.0–10.5)

## 2015-10-11 LAB — BASIC METABOLIC PANEL
ANION GAP: 4 — AB (ref 5–15)
BUN: <5 mg/dL — ABNORMAL LOW (ref 6–20)
CHLORIDE: 110 mmol/L (ref 101–111)
CO2: 23 mmol/L (ref 22–32)
Calcium: 9 mg/dL (ref 8.9–10.3)
Creatinine, Ser: 0.39 mg/dL — ABNORMAL LOW (ref 0.44–1.00)
GFR calc non Af Amer: >60 mL/min (ref >60)
Glucose, Bld: 125 mg/dL — ABNORMAL HIGH (ref 65–99)
POTASSIUM: 3.9 mmol/L (ref 3.5–5.1)
Sodium: 137 mmol/L (ref 135–145)

## 2015-10-11 LAB — RETICULOCYTES
RBC.: 2.16 MIL/uL — AB (ref 3.87–5.11)
RETIC COUNT ABSOLUTE: 468.7 10*3/uL — AB (ref 19.0–186.0)
RETIC CT PCT: 21.7 % — AB (ref 0.4–3.1)

## 2015-10-11 MED FILL — OXYCODONE-ACETAMINOPHEN 10-: 10-325 | 15 days supply | Qty: 90 | Fill #0

## 2015-10-11 NOTE — Progress Notes (Signed)
SICKLE CELL SERVICE PROGRESS NOTE  Brittany Foster ZHY:[id number] DOB: 1985/05/08 DOA: 10/08/2015 PCP: Angelica Chessman, MD  Assessment/Plan: Principal Problem:   Sickle cell pain crisis ([place]) Active Problems:   Depression   Hypokalemia   Chronic pain syndrome   Chest pain   Leukocytosis  1. Hb SS with crisis: Pt refusing Toradol which is an essential component of treating vaso-occlusive crisis which is an inflammatory condition. I have spoken with patient about the importance of concurrent treatment with anti-inflammatory agent as well as opiates to manage her pain. Will continue Toradol and PCA. Will consider discharge if she continues to refuse treatment.  2. B/L hip pain: Pt's pain in bilateral hips is at the level where her gait is impaired due to pain. I feel that this is secondary to her sickle cell crisis. She has no deficit in strength and she is able to sit comfortably in the position of flexion and external rotation. She had similar complaints in January and had x-rays of her hips 03/11/2015 which showed no acute abnormalities. I do not feel that any imaging is warranted at this time.  3. Hypokalemia: Replaced orally. Labs pending from today.  4. Leukocytosis: Pt has a mild leukocytosis without any signs of infection. Likely related to crisis. Will continue to monitor. 5. Anemia of Chronic disease: Labs pending from today. Continue Hydrea  At current dose. However, current dose subtherapeutic at 8.8 mg/kg but MCV elevated  6. Chronic pain: Continue MS Contin.  7. Mood Disorder: Pt takes Topamax and Cymbalta for mood disorder. Continue.  8. Headaches: Continue Topamax ER.   Code Status: Full Code Family Communication: N/A Disposition Plan: Not yet ready for discharge  [place].  Pager [phone]. If 7PM-7AM, please contact night-coverage.  10/11/2015, 11:47 AM  LOS: 3 days   Interim History: Pt very argumentative as she wants to be treated only with opiates and IV  Benadryl. She states that she does not like the way she feels when taking Hydrea so her dose has been decreased to 500 mg daily. Additionally she refuses any NSAID and states that she does not believe that it is helpful in treating a vaso-occlusive crisis. I have tried to explain to the patient the importance of treating the inflammation associated with vaso-occlusive crisis and she reluctantly agreed. She substitutes mis-information  for established pathophysiology of the disease and verbalizes poor understanding of terminology associated with her disease.(e.g. She believes "chronic" to mean a disease that doctors don't want to treat).  She demonstrates illogical reasoning and is unwilling to change her opinions if it does not support the sole use of opiates. She has used 47 mg with 85/73:demands/deliveries in addition to 14 mg in the last 24 hours.  Consultants:  None  Procedures:  None  Antibiotics:  None   Objective: Vitals:   10/10/15 2342 10/11/15 0412 10/11/15 0525 10/11/15 0725  BP: 106/60 (!) 107/53    Pulse: 98 88    Resp: _0 Temp: 98.1 F (36.7 C) 98.1 F (36.7 C)    TempSrc: Oral Oral    SpO2: 97% 95% 97% 96%  Weight:      Height:       Weight change:  No intake or output data in the 24 hours ending 10/11/15 1147  General: Alert, awake, oriented x3, in moderate distress secondary to pain.  HEENT: [place]/AT PEERL, EOMI, anicteric Neck: Trachea midline,  no masses, no thyromegal,y no JVD, no carotid bruit OROPHARYNX:  Moist, No exudate/  erythema/lesions.  Heart: Regular rate and rhythm, without murmurs, rubs, gallops, PMI non-displaced, no heaves or thrills on palpation.  Lungs: Clear to auscultation, no wheezing or rhonchi noted. No increased vocal fremitus resonant to percussion  Abdomen: Soft, nontender, nondistended, positive bowel sounds, no masses no hepatosplenomegaly noted.  Neuro: No focal neurological deficits noted cranial nerves II through XII  grossly intact. Strength at functional baseline in bilateral upper and lower extremities. Musculoskeletal: No warmth swelling or erythema around joints, no spinal tenderness noted. Skin: No rashes or wheals noted on skin. No scratch marks noted.  Psychiatric: Patient alert and oriented x3, tearful and  frustrated.    Data Reviewed: Basic Metabolic Panel:  Recent Labs Lab 10/08/15 1747 10/09/15 0400 10/10/15 0445  NA 138 136 139  K 3.3* 3.4* 3.4*  CL 109 110 113*  CO2 20* 23 23  GLUCOSE 132* 111* 124*  BUN _0 CREATININE 0.44 0.52 0.41*  CALCIUM 9.4 8.5* 8.1*   Liver Function Tests:  Recent Labs Lab 10/08/15 1747 10/09/15 0400 10/10/15 0445  AST 50* 51* 39  ALT 34 30 24  ALKPHOS 85 69 69  BILITOT 3.4* 1.8* 1.7*  PROT 8.5* 6.6 6.1*  ALBUMIN 4.9 3.9 3.6   No results for input(s): LIPASE, AMYLASE in the last 168 hours. No results for input(s): AMMONIA in the last 168 hours. CBC:  Recent Labs Lab 10/08/15 1747 10/09/15 0400  WBC 14.9* 14.3*  NEUTROABS 11.0*  --   HGB 8.8* 7.3*  HCT 24.8* 20.4*  MCV 100.8* 102.0*  PLT 307 322   Cardiac Enzymes:  Recent Labs Lab 10/09/15 0025 10/09/15 0400  TROPONINI <0.03 <0.03   BNP (last 3 results) No results for input(s): BNP in the last 8760 hours.  ProBNP (last 3 results) No results for input(s): PROBNP in the last 8760 hours.  CBG: No results for input(s): GLUCAP in the last 168 hours.  No results found for this or any previous visit (from the past 240 hour(s)).   Studies: Dg Chest 2 View  Result Date: 10/08/2015 CLINICAL DATA:  Sickle cell disease and complains of chest pain. Mild shortness of breath. EXAM: CHEST  2 VIEW COMPARISON:  09/02/2015 FINDINGS: Right subclavian Port-A-Cath has been removed and the patient now has a right jugular central line with the tip near the superior cavoatrial junction. There is a chronic catheter fragment in the left innominate vein that is poorly characterized on this  examination. Both lungs are clear without pulmonary edema or airspace disease. Heart and mediastinum are within normal limits. The trachea is midline. No pleural effusions. No acute bone abnormality. IMPRESSION: No active cardiopulmonary disease. Central line tip at the superior cavoatrial junction. Electronically Signed   By: Markus Daft M.D.   On: 10/08/2015 16:53   Ir Fluoro Guide Cv Line Right  Result Date: 10/08/2015 INDICATION: History of sickle cell disease, in need of durable intravenous access for frequent blood draws and medication administration. Note, patient has history of prior Port a catheter placement however recently had Port a catheter removed secondary to bacteremia. As such, request made made for placement of a tunneled central venous catheter in lieu of placement of a formal Port a Catheter. EXAM: TUNNELED PICC LINE WITH ULTRASOUND AND FLUOROSCOPIC GUIDANCE MEDICATIONS: None ANESTHESIA/SEDATION: None COMPLICATIONS: None immediate. PROCEDURE: Informed written consent was obtained from the patient after a discussion of the risks, benefits, and alternatives to treatment. Questions regarding the procedure were encouraged and answered. The right neck and chest  were prepped with chlorhexidine in a sterile fashion, and a sterile drape was applied covering the operative field. Maximum barrier sterile technique with sterile gowns and gloves were used for the procedure. A timeout was performed prior to the initiation of the procedure. After creating a small venotomy incision, a micropuncture kit was utilized to access the right internal jugular vein under direct, real-time ultrasound guidance after the overlying soft tissues were anesthetized with 1% lidocaine with epinephrine. Ultrasound image documentation was performed. The microwire was kinked to measure appropriate catheter length. The micropuncture sheath was exchanged for a peel-away sheath over a guidewire. A 5 French single lumen tunneled PICC  measuring 20 cm was tunneled in a retrograde fashion from the anterior chest wall to the venotomy incision. The catheter was then placed through the peel-away sheath with tip ultimately positioned at the superior caval-atrial junction. Final catheter positioning was confirmed and documented with a spot radiographic image. The catheter aspirates and flushes normally. The catheter was flushed with appropriate volume heparin dwells. The catheter exit site was secured with a 0-Prolene retention suture. The venotomy incision was closed with an interrupted 4-0 Vicryl, Dermabond and Steri-strips. Dressings were applied. The patient tolerated the procedure well without immediate post procedural complication. FINDINGS: After catheter placement, the tip lies within the superior cavoatrial junction. The catheter aspirates and flushes normally and is ready for immediate use. Re- demonstrated abandoned catheter tubing overlying the expected location of the central aspect of the left innominate vein. IMPRESSION: Successful placement of 20 cm single lumen tunneled PICC catheter via the right internal jugular vein with tip terminating at the superior caval atrial junction. The catheter is ready for immediate use. Electronically Signed   By: Sandi Mariscal M.D.   On: 10/08/2015 17:04   Ir US Guide Vasc Access Right  Result Date: 10/08/2015 INDICATION: History of sickle cell disease, in need of durable intravenous access for frequent blood draws and medication administration. Note, patient has history of prior Port a catheter placement however recently had Port a catheter removed secondary to bacteremia. As such, request made made for placement of a tunneled central venous catheter in lieu of placement of a formal Port a Catheter. EXAM: TUNNELED PICC LINE WITH ULTRASOUND AND FLUOROSCOPIC GUIDANCE MEDICATIONS: None ANESTHESIA/SEDATION: None COMPLICATIONS: None immediate. PROCEDURE: Informed written consent was obtained from the  patient after a discussion of the risks, benefits, and alternatives to treatment. Questions regarding the procedure were encouraged and answered. The right neck and chest were prepped with chlorhexidine in a sterile fashion, and a sterile drape was applied covering the operative field. Maximum barrier sterile technique with sterile gowns and gloves were used for the procedure. A timeout was performed prior to the initiation of the procedure. After creating a small venotomy incision, a micropuncture kit was utilized to access the right internal jugular vein under direct, real-time ultrasound guidance after the overlying soft tissues were anesthetized with 1% lidocaine with epinephrine. Ultrasound image documentation was performed. The microwire was kinked to measure appropriate catheter length. The micropuncture sheath was exchanged for a peel-away sheath over a guidewire. A 5 French single lumen tunneled PICC measuring 20 cm was tunneled in a retrograde fashion from the anterior chest wall to the venotomy incision. The catheter was then placed through the peel-away sheath with tip ultimately positioned at the superior caval-atrial junction. Final catheter positioning was confirmed and documented with a spot radiographic image. The catheter aspirates and flushes normally. The catheter was flushed with appropriate volume heparin dwells.  The catheter exit site was secured with a 0-Prolene retention suture. The venotomy incision was closed with an interrupted 4-0 Vicryl, Dermabond and Steri-strips. Dressings were applied. The patient tolerated the procedure well without immediate post procedural complication. FINDINGS: After catheter placement, the tip lies within the superior cavoatrial junction. The catheter aspirates and flushes normally and is ready for immediate use. Re- demonstrated abandoned catheter tubing overlying the expected location of the central aspect of the left innominate vein. IMPRESSION: Successful  placement of 20 cm single lumen tunneled PICC catheter via the right internal jugular vein with tip terminating at the superior caval atrial junction. The catheter is ready for immediate use. Electronically Signed   By: Sandi Mariscal M.D.   On: 10/08/2015 17:04    Scheduled Meds: . DULoxetine  60 mg Oral Daily  . enoxaparin (LOVENOX) injection  40 mg Subcutaneous Q24H  . folic acid  1 mg Oral Daily  . HYDROmorphone   Intravenous Q4H  . hydroxyurea  500 mg Oral Daily  . ketorolac  30 mg Intravenous Q6H  . morphine  30 mg Oral Q12H  . potassium chloride  40 mEq Oral BID  . senna-docusate  1 tablet Oral BID  . sodium chloride flush  3 mL Intravenous Q12H  . topiramate  100 mg Oral QHS   Continuous Infusions: . sodium chloride 10 mL/hr at 10/11/15 0950    In excess of 25 minutes spent during this visit. Greater than 50% involved face to face contact with the patient for assessment, counseling and coordination of care.

## 2015-10-13 LAB — CBC WITH DIFFERENTIAL/PLATELET
BASOS ABS: 0 10*3/uL (ref 0.0–0.1)
Basophils Relative: 0 %
Eosinophils Absolute: 0.3 10*3/uL (ref 0.0–0.7)
Eosinophils Relative: 3 %
HEMATOCRIT: 20.7 % — AB (ref 36.0–46.0)
Hemoglobin: 7.3 g/dL — ABNORMAL LOW (ref 12.0–15.0)
LYMPHS ABS: 3.4 10*3/uL (ref 0.7–4.0)
Lymphocytes Relative: 35 %
MCH: 36.9 pg — ABNORMAL HIGH (ref 26.0–34.0)
MCHC: 35.3 g/dL (ref 30.0–36.0)
MCV: 104.5 fL — ABNORMAL HIGH (ref 78.0–100.0)
MONO ABS: 0.9 10*3/uL (ref 0.1–1.0)
MONOS PCT: 9 %
Neutro Abs: 5.2 10*3/uL (ref 1.7–7.7)
Neutrophils Relative %: 53 %
PLATELETS: 301 10*3/uL (ref 150–400)
RBC: 1.98 MIL/uL — AB (ref 3.87–5.11)
RDW: 20.2 % — AB (ref 11.5–15.5)
WBC: 9.8 10*3/uL (ref 4.0–10.5)

## 2015-10-13 MED ORDER — OXYCODONE-ACETAMINOPHEN 10-325 MG PO TABS: 1.0000 | ORAL_TABLET | ORAL | Status: DC | PRN

## 2015-10-13 MED ORDER — OXYCODONE HCL 5 MG PO TABS: 5.0000 mg | ORAL_TABLET | ORAL | Status: DC | PRN

## 2015-10-13 MED ORDER — HEPARIN SOD (PORK) LOCK FLUSH 100 UNIT/ML IV SOLN
250.0000 [IU] | INTRAVENOUS | Status: DC | PRN
  Filled 2015-10-13: qty 3

## 2015-10-13 MED ORDER — OXYCODONE-ACETAMINOPHEN 5-325 MG PO TABS: 1.0000 | ORAL_TABLET | ORAL | Status: DC | PRN

## 2015-10-13 NOTE — Progress Notes (Signed)
SICKLE CELL SERVICE PROGRESS NOTE  Brittany Foster RCV:[id number] DOB: 02-03-1986 DOA: 10/08/2015 PCP: Angelica Chessman, MD  Assessment/Plan: Principal Problem:   Sickle cell pain crisis ([place]) Active Problems:   Depression   Hypokalemia   Chronic pain syndrome   Chest pain   Leukocytosis  1. Hb SS with crisis: Pt was refusing Toradol which is an essential component of treating vaso-occlusive crisis which is an inflammatory condition.She has finally accepted to use it. We will continue Dilaudid PCA, Toradol, long acting medications. 2. B/L hip pain: Pt's pain in bilateral hips is at the level where her gait is impaired due to pain. Continue current pain management and mobility..  3. Hypokalemia: Replaced orally. Labs pending from today.  4. Leukocytosis: Pt has a mild leukocytosis without any signs of infection. Likely related to crisis. Will continue to monitor. 5. Anemia of Chronic disease: Labs pending from today. Continue Hydrea  At current dose. However, current dose subtherapeutic at 8.8 mg/kg but MCV elevated  6. Chronic pain: Continue MS Contin.  7. Mood Disorder: Pt takes Topamax and Cymbalta for mood disorder. Continue.  8. Headaches: Continue Topamax ER.   Code Status: Full Code Family Communication: N/A Disposition Plan: Not yet ready for discharge  [facility]  Pager [phone]. If 7PM-7AM, please contact night-coverage.  10/12/2015, 4:04 PM  LOS: 5 days   Interim History: Pt very argumentative as she wants to be treated only with opiates and IV Benadryl. She states that she does not like the way she feels when taking Hydrea so her dose has been decreased to 500 mg daily. Additionally she refuses any NSAID and states that she does not believe that it is helpful in treating a vaso-occlusive crisis. I have tried to explain to the patient the importance of treating the inflammation associated with vaso-occlusive crisis and she reluctantly agreed. She substitutes mis-information   for established pathophysiology of the disease and verbalizes poor understanding of terminology associated with her disease.(e.g. She believes "chronic" to mean a disease that doctors don't want to treat).  She demonstrates illogical reasoning and is unwilling to change her opinions if it does not support the sole use of opiates. She has used 47 mg with 85/73:demands/deliveries in addition to 14 mg in the last 24 hours.  Consultants:  None  Procedures:  None  Antibiotics:  None   Objective: Vitals:   10/12/15 0800 10/12/15 1030 10/13/15 1245 10/13/15 1441  BP:  (!) 91/47  (!) 100/56  Pulse:  97  69  Resp: '14 16 10 11  '$ Temp:  98.4 F (36.9 C)  98.5 F (36.9 C)  TempSrc:  Oral  Oral  SpO2: 100% 99% 98% 97%  Weight:      Height:       Weight change:   Intake/Output Summary (Last 24 hours) at 10/12/15 1604 Last data filed at 10/12/15 1000  Gross per 24 hour  Intake              323 ml  Output                0 ml  Net              323 ml    General: Alert, awake, oriented x3, in moderate distress secondary to pain.  HEENT: [place]/AT PEERL, EOMI, anicteric Neck: Trachea midline,  no masses, no thyromegal,y no JVD, no carotid bruit OROPHARYNX:  Moist, No exudate/ erythema/lesions.  Heart: Regular rate and rhythm, without murmurs, rubs, gallops, PMI non-displaced, no  heaves or thrills on palpation.  Lungs: Clear to auscultation, no wheezing or rhonchi noted. No increased vocal fremitus resonant to percussion  Abdomen: Soft, nontender, nondistended, positive bowel sounds, no masses no hepatosplenomegaly noted.  Neuro: No focal neurological deficits noted cranial nerves II through XII grossly intact. Strength at functional baseline in bilateral upper and lower extremities. Musculoskeletal: No warmth swelling or erythema around joints, no spinal tenderness noted. Skin: No rashes or wheals noted on skin. No scratch marks noted.  Psychiatric: Patient alert and oriented x3, tearful and   frustrated.    Data Reviewed: Basic Metabolic Panel:  Recent Labs Lab 10/08/15 1747 10/09/15 0400 10/10/15 0445 10/11/15 1200  NA 138 136 139 137  K 3.3* 3.4* 3.4* 3.9  CL 109 110 113* 110  CO2 20* '23 23 23  '$ GLUCOSE 132* 111* 124* 125*  BUN '9 10 7 '$ <5*  CREATININE 0.44 0.52 0.41* 0.39*  CALCIUM 9.4 8.5* 8.1* 9.0   Liver Function Tests:  Recent Labs Lab 10/08/15 1747 10/09/15 0400 10/10/15 0445  AST 50* 51* 39  ALT 34 30 24  ALKPHOS 85 69 69  BILITOT 3.4* 1.8* 1.7*  PROT 8.5* 6.6 6.1*  ALBUMIN 4.9 3.9 3.6   No results for input(s): LIPASE, AMYLASE in the last 168 hours. No results for input(s): AMMONIA in the last 168 hours. CBC:  Recent Labs Lab 10/08/15 1747 10/09/15 0400 10/11/15 1200 10/13/15 0800  WBC 14.9* 14.3* 13.5* 9.8  NEUTROABS 11.0*  --  8.1* 5.2  HGB 8.8* 7.3* 7.8* 7.3*  HCT 24.8* 20.4* 22.3* 20.7*  MCV 100.8* 102.0* 103.2* 104.5*  PLT 307 322 395 301   Cardiac Enzymes:  Recent Labs Lab 10/09/15 0025 10/09/15 0400  TROPONINI <0.03 <0.03   BNP (last 3 results) No results for input(s): BNP in the last 8760 hours.  ProBNP (last 3 results) No results for input(s): PROBNP in the last 8760 hours.  CBG: No results for input(s): GLUCAP in the last 168 hours.  No results found for this or any previous visit (from the past 240 hour(s)).   Studies: Dg Chest 2 View  Result Date: 10/08/2015 CLINICAL DATA:  Sickle cell disease and complains of chest pain. Mild shortness of breath. EXAM: CHEST  2 VIEW COMPARISON:  09/02/2015 FINDINGS: Right subclavian Port-A-Cath has been removed and the patient now has a right jugular central line with the tip near the superior cavoatrial junction. There is a chronic catheter fragment in the left innominate vein that is poorly characterized on this examination. Both lungs are clear without pulmonary edema or airspace disease. Heart and mediastinum are within normal limits. The trachea is midline. No pleural  effusions. No acute bone abnormality. IMPRESSION: No active cardiopulmonary disease. Central line tip at the superior cavoatrial junction. Electronically Signed   By: Markus Daft M.D.   On: 10/08/2015 16:53   Ir Fluoro Guide Cv Line Right  Result Date: 10/08/2015 INDICATION: History of sickle cell disease, in need of durable intravenous access for frequent blood draws and medication administration. Note, patient has history of prior Port a catheter placement however recently had Port a catheter removed secondary to bacteremia. As such, request made made for placement of a tunneled central venous catheter in lieu of placement of a formal Port a Catheter. EXAM: TUNNELED PICC LINE WITH ULTRASOUND AND FLUOROSCOPIC GUIDANCE MEDICATIONS: None ANESTHESIA/SEDATION: None COMPLICATIONS: None immediate. PROCEDURE: Informed written consent was obtained from the patient after a discussion of the risks, benefits, and alternatives to treatment. Questions  regarding the procedure were encouraged and answered. The right neck and chest were prepped with chlorhexidine in a sterile fashion, and a sterile drape was applied covering the operative field. Maximum barrier sterile technique with sterile gowns and gloves were used for the procedure. A timeout was performed prior to the initiation of the procedure. After creating a small venotomy incision, a micropuncture kit was utilized to access the right internal jugular vein under direct, real-time ultrasound guidance after the overlying soft tissues were anesthetized with 1% lidocaine with epinephrine. Ultrasound image documentation was performed. The microwire was kinked to measure appropriate catheter length. The micropuncture sheath was exchanged for a peel-away sheath over a guidewire. A 5 French single lumen tunneled PICC measuring 20 cm was tunneled in a retrograde fashion from the anterior chest wall to the venotomy incision. The catheter was then placed through the peel-away  sheath with tip ultimately positioned at the superior caval-atrial junction. Final catheter positioning was confirmed and documented with a spot radiographic image. The catheter aspirates and flushes normally. The catheter was flushed with appropriate volume heparin dwells. The catheter exit site was secured with a 0-Prolene retention suture. The venotomy incision was closed with an interrupted 4-0 Vicryl, Dermabond and Steri-strips. Dressings were applied. The patient tolerated the procedure well without immediate post procedural complication. FINDINGS: After catheter placement, the tip lies within the superior cavoatrial junction. The catheter aspirates and flushes normally and is ready for immediate use. Re- demonstrated abandoned catheter tubing overlying the expected location of the central aspect of the left innominate vein. IMPRESSION: Successful placement of 20 cm single lumen tunneled PICC catheter via the right internal jugular vein with tip terminating at the superior caval atrial junction. The catheter is ready for immediate use. Electronically Signed   By: Sandi Mariscal M.D.   On: 10/08/2015 17:04   Ir US Guide Vasc Access Right  Result Date: 10/08/2015 INDICATION: History of sickle cell disease, in need of durable intravenous access for frequent blood draws and medication administration. Note, patient has history of prior Port a catheter placement however recently had Port a catheter removed secondary to bacteremia. As such, request made made for placement of a tunneled central venous catheter in lieu of placement of a formal Port a Catheter. EXAM: TUNNELED PICC LINE WITH ULTRASOUND AND FLUOROSCOPIC GUIDANCE MEDICATIONS: None ANESTHESIA/SEDATION: None COMPLICATIONS: None immediate. PROCEDURE: Informed written consent was obtained from the patient after a discussion of the risks, benefits, and alternatives to treatment. Questions regarding the procedure were encouraged and answered. The right neck and  chest were prepped with chlorhexidine in a sterile fashion, and a sterile drape was applied covering the operative field. Maximum barrier sterile technique with sterile gowns and gloves were used for the procedure. A timeout was performed prior to the initiation of the procedure. After creating a small venotomy incision, a micropuncture kit was utilized to access the right internal jugular vein under direct, real-time ultrasound guidance after the overlying soft tissues were anesthetized with 1% lidocaine with epinephrine. Ultrasound image documentation was performed. The microwire was kinked to measure appropriate catheter length. The micropuncture sheath was exchanged for a peel-away sheath over a guidewire. A 5 French single lumen tunneled PICC measuring 20 cm was tunneled in a retrograde fashion from the anterior chest wall to the venotomy incision. The catheter was then placed through the peel-away sheath with tip ultimately positioned at the superior caval-atrial junction. Final catheter positioning was confirmed and documented with a spot radiographic image. The catheter aspirates  and flushes normally. The catheter was flushed with appropriate volume heparin dwells. The catheter exit site was secured with a 0-Prolene retention suture. The venotomy incision was closed with an interrupted 4-0 Vicryl, Dermabond and Steri-strips. Dressings were applied. The patient tolerated the procedure well without immediate post procedural complication. FINDINGS: After catheter placement, the tip lies within the superior cavoatrial junction. The catheter aspirates and flushes normally and is ready for immediate use. Re- demonstrated abandoned catheter tubing overlying the expected location of the central aspect of the left innominate vein. IMPRESSION: Successful placement of 20 cm single lumen tunneled PICC catheter via the right internal jugular vein with tip terminating at the superior caval atrial junction. The catheter is  ready for immediate use. Electronically Signed   By: Sandi Mariscal M.D.   On: 10/08/2015 17:04    Scheduled Meds: . DULoxetine  60 mg Oral Daily  . enoxaparin (LOVENOX) injection  40 mg Subcutaneous Q24H  . folic acid  1 mg Oral Daily  . HYDROmorphone   Intravenous Q4H  . hydroxyurea  500 mg Oral Daily  . ketorolac  30 mg Intravenous Q6H  . morphine  30 mg Oral Q12H  . potassium chloride  40 mEq Oral BID  . senna-docusate  1 tablet Oral BID  . sodium chloride flush  3 mL Intravenous Q12H  . topiramate  100 mg Oral QHS   Continuous Infusions: . sodium chloride 10 mL/hr at 10/11/15 0950    In excess of 25 minutes spent during this visit. Greater than 50% involved face to face contact with the patient for assessment, counseling and coordination of care.

## 2015-10-13 NOTE — Discharge Summary (Signed)
Physician Discharge Summary  Patient ID: Brittany CampbellMiranda Finchum MRN: [id number] DOB/AGE: 31/03/1985 31 y.o.  Admit date: 10/08/2015 Discharge date: 10/13/2015  Admission Diagnoses:  Discharge Diagnoses:  Principal Problem:   Sickle cell pain crisis ([facility]) Active Problems:   Depression   Hypokalemia   Chronic pain syndrome   Chest pain   Leukocytosis   Discharged Condition: good  Hospital Course: Patient admitted with sickle cell painful crisis. She was treated with Dilaudid PCA and Toradol she was surely refused Toradol. She later accepted. Her hemoglobin is at baseline and patient was maintained on Hydrea. She has chronic pain syndrome with recurrent hospitalization. Patient complained of hip pain bilaterally. She was treated with pain management and Physical therapy. She was stable at the end and was discharged home to follow up with PCP.  Consults: None  Significant Diagnostic Studies: labs: CBC and CMPserially. No transfusion required.  Treatments: IV hydration and analgesia: Dilaudid  Discharge Exam: Blood pressure (!) 100/56, pulse 69, temperature 98.5 F (36.9 C), temperature source Oral, resp. rate 11, height 5' (1.524 m), weight 56.7 kg (125 lb), last menstrual period 08/01/2015, SpO2 97 %. General appearance: alert, cooperative and no distress Neck: no adenopathy, no carotid bruit, no JVD, supple, symmetrical, trachea midline and thyroid not enlarged, symmetric, no tenderness/mass/nodules Back: symmetric, no curvature. ROM normal. No CVA tenderness. Resp: clear to auscultation bilaterally Cardio: regular rate and rhythm, S1, S2 normal, no murmur, click, rub or gallop GI: soft, non-tender; bowel sounds normal; no masses,  no organomegaly Extremities: extremities normal, atraumatic, no cyanosis or edema Pulses: 2+ and symmetric Skin: Skin color, texture, turgor normal. No rashes or lesions Neurologic: Grossly normal  Disposition: 01-Home or Self Care     Medication List     STOP taking these medications   oxyCODONE-acetaminophen 10-325 MG tablet Commonly known as:  PERCOCET     TAKE these medications   DULoxetine 60 MG capsule Commonly known as:  CYMBALTA Take 1 capsule (60 mg total) by mouth daily.   folic acid 1 MG tablet Commonly known as:  FOLVITE Take 1 tablet (1 mg total) by mouth daily.   hydroxyurea 500 MG capsule Commonly known as:  HYDREA TAKE 2 CAPSULES BY MOUTH DAILY. MAY TAKE WITH FOOD TO MINIMIZE GI SIDE EFFECTS. What changed:  how much to take  how to take this  when to take this  additional instructions   ibuprofen 200 MG tablet Commonly known as:  ADVIL,MOTRIN Take 400 mg by mouth every 6 (six) hours as needed for moderate pain.   morphine 30 MG 12 hr tablet Commonly known as:  MS CONTIN Take 1 tablet (30 mg total) by mouth every 12 (twelve) hours.   Topiramate ER 100 MG Cp24 Take 100 mg by mouth at bedtime.        SignedLonia Blood: GARBA,LAWAL 10/13/2015, 3:44 PM  Time spent 32 minutes

## 2015-10-13 NOTE — Progress Notes (Addendum)
Rt chest picc line flushed with 10 ml normal saline and 250 units heparin per Dr. Mikeal HawthorneGarba. Discharge instructions explained to pt, states understanding.Husband in to take pt home.

## 2015-10-17 ENCOUNTER — Telehealth (HOSPITAL_COMMUNITY): Payer: Self-pay | Admitting: Internal Medicine

## 2015-10-17 NOTE — Telephone Encounter (Signed)
Pt called and states experiencing pain in legs and back; states has taken home pain medications as prescribed; denies CP, fever, shortness of breath, diarrhea, or vomiting; MD notified; pt informed, per MD, to take home medications as prescribed and increase fluid intake; pt notified to go to call day hospital in the AM if pain persists; pt verbalizes understanding

## 2015-10-18 ENCOUNTER — Telehealth (HOSPITAL_COMMUNITY): Payer: Self-pay | Admitting: *Deleted

## 2015-10-18 ENCOUNTER — Encounter (HOSPITAL_COMMUNITY): Payer: Self-pay | Admitting: *Deleted

## 2015-10-18 ENCOUNTER — Non-Acute Institutional Stay (HOSPITAL_BASED_OUTPATIENT_CLINIC_OR_DEPARTMENT_OTHER)
Admission: AD | Admit: 2015-10-18 | Discharge: 2015-10-18 | Disposition: A | Discharge status: Home / Self Care | Payer: Medicaid Other | Source: Ambulatory Visit | Attending: Internal Medicine | Admitting: Internal Medicine

## 2015-10-18 DIAGNOSIS — D5700 Hb-SS disease with crisis, unspecified: Secondary | ICD-10-CM | POA: Diagnosis not present

## 2015-10-18 DIAGNOSIS — Z79899 Other long term (current) drug therapy: Secondary | ICD-10-CM

## 2015-10-18 DIAGNOSIS — F329 Major depressive disorder, single episode, unspecified: Secondary | ICD-10-CM

## 2015-10-18 LAB — CBC WITH DIFFERENTIAL/PLATELET
Basophils Absolute: 0 10*3/uL (ref 0.0–0.1)
Basophils Relative: 0 %
Eosinophils Absolute: 0.1 10*3/uL (ref 0.0–0.7)
Eosinophils Relative: 1 %
HCT: 24.2 % — ABNORMAL LOW (ref 36.0–46.0)
HEMOGLOBIN: 8.2 g/dL — AB (ref 12.0–15.0)
LYMPHS PCT: 25 %
Lymphs Abs: 2.4 10*3/uL (ref 0.7–4.0)
MCH: 34.7 pg — AB (ref 26.0–34.0)
MCHC: 33.9 g/dL (ref 30.0–36.0)
MCV: 102.5 fL — ABNORMAL HIGH (ref 78.0–100.0)
Monocytes Absolute: 1 10*3/uL (ref 0.1–1.0)
Monocytes Relative: 10 %
NEUTROS ABS: 6.2 10*3/uL (ref 1.7–7.7)
NEUTROS PCT: 64 %
PLATELETS: 364 10*3/uL (ref 150–400)
RBC: 2.36 MIL/uL — ABNORMAL LOW (ref 3.87–5.11)
RDW: 17.9 % — ABNORMAL HIGH (ref 11.5–15.5)
WBC: 9.7 10*3/uL (ref 4.0–10.5)

## 2015-10-18 LAB — URINALYSIS, ROUTINE W REFLEX MICROSCOPIC
BILIRUBIN URINE: NEGATIVE
Glucose, UA: NEGATIVE mg/dL
Hgb urine dipstick: NEGATIVE
Ketones, ur: NEGATIVE mg/dL
NITRITE: NEGATIVE
PH: 6 (ref 5.0–8.0)
Protein, ur: NEGATIVE mg/dL
SPECIFIC GRAVITY, URINE: 1.015 (ref 1.005–1.030)

## 2015-10-18 LAB — URINE MICROSCOPIC-ADD ON: RBC / HPF: NONE SEEN RBC/hpf (ref 0–5)

## 2015-10-18 LAB — RETICULOCYTES
RBC.: 2.36 MIL/uL — ABNORMAL LOW (ref 3.87–5.11)
RETIC CT PCT: 14 % — AB (ref 0.4–3.1)
Retic Count, Absolute: 330.4 10*3/uL — ABNORMAL HIGH (ref 19.0–186.0)

## 2015-10-18 MED ORDER — HYDROMORPHONE 1 MG/ML IV SOLN
INTRAVENOUS | Status: DC
  Administered 2015-10-18: 11:00:00 via INTRAVENOUS
  Administered 2015-10-18: 16 mg via INTRAVENOUS
  Filled 2015-10-18: qty 25

## 2015-10-18 MED ORDER — SODIUM CHLORIDE 0.9 % IV SOLN
25.0000 mg | INTRAVENOUS | Status: DC | PRN
  Administered 2015-10-18: 25 mg via INTRAVENOUS
  Filled 2015-10-18 (×3): qty 0.5

## 2015-10-18 MED ORDER — POLYETHYLENE GLYCOL 3350 17 G PO PACK
17.0000 g | PACK | Freq: Every day | ORAL | Status: DC | PRN
  Filled 2015-10-18: qty 1

## 2015-10-18 MED ORDER — DIPHENHYDRAMINE HCL 25 MG PO CAPS: 25.0000 mg | ORAL_CAPSULE | ORAL | Status: DC | PRN

## 2015-10-18 MED ORDER — SODIUM CHLORIDE 0.9% FLUSH: 9.0000 mL | INTRAVENOUS | Status: DC | PRN

## 2015-10-18 MED ORDER — NALOXONE HCL 0.4 MG/ML IJ SOLN: 0.4000 mg | INTRAMUSCULAR | Status: DC | PRN

## 2015-10-18 MED ORDER — DEXTROSE-NACL 5-0.45 % IV SOLN
INTRAVENOUS | Status: DC
  Administered 2015-10-18: 11:00:00 via INTRAVENOUS

## 2015-10-18 MED ORDER — SODIUM CHLORIDE 0.9% FLUSH
10.0000 mL | INTRAVENOUS | Status: AC | PRN
  Administered 2015-10-18: 10 mL

## 2015-10-18 MED ORDER — KETOROLAC TROMETHAMINE 30 MG/ML IJ SOLN
30.0000 mg | Freq: Four times a day (QID) | INTRAMUSCULAR | Status: DC
  Administered 2015-10-18: 30 mg via INTRAVENOUS
  Filled 2015-10-18: qty 1

## 2015-10-18 MED ORDER — SENNOSIDES-DOCUSATE SODIUM 8.6-50 MG PO TABS: 1.0000 | ORAL_TABLET | Freq: Two times a day (BID) | ORAL | Status: DC

## 2015-10-18 MED ORDER — PROMETHAZINE HCL 25 MG PO TABS
25.0000 mg | ORAL_TABLET | Freq: Four times a day (QID) | ORAL | Status: DC | PRN
  Administered 2015-10-18: 25 mg via ORAL
  Filled 2015-10-18 (×2): qty 1

## 2015-10-18 MED ORDER — ONDANSETRON HCL 4 MG/2ML IJ SOLN: 4.0000 mg | Freq: Four times a day (QID) | INTRAMUSCULAR | Status: DC | PRN

## 2015-10-18 MED ORDER — HEPARIN SOD (PORK) LOCK FLUSH 100 UNIT/ML IV SOLN
250.0000 [IU] | INTRAVENOUS | Status: AC | PRN
  Administered 2015-10-18: 250 [IU]
  Filled 2015-10-18: qty 5

## 2015-10-18 NOTE — Telephone Encounter (Signed)
[facility] back. Rating pain as 8/10 intensity. Took Oxycodone 10 mg at 6 am and MS contin at 4 am with relief.

## 2015-10-18 NOTE — Progress Notes (Signed)
Patient ID: Brittany Foster, female   DOB: 09/13/1985, 30 y.o.   MRN: [id number] [facility] in arms and legs. Rating intensity of pain 8/10. Treated with IV fluids, IV Toradol, PCA Dilaudid. Pain level down to 7 at time of discharge, patient feels better and feels that she can manage her pain at home now. Went over discharge instructions and copy given to patient. Knows to call MD or come to ED for fever. Reminded patient to continue drinking plenty of water and continue home medications as ordered.

## 2015-10-18 NOTE — Discharge Summary (Signed)
Physician Discharge Summary  Brittany Foster RDE:[id number] DOB: Feb 10, 1986 DOA: 10/18/2015  PCP: Angelica Chessman, MD  Admit date: 10/18/2015  Discharge date: 10/18/2015  Time spent: 30 minutes  Discharge Diagnoses:  Active Problems:   Sickle cell anemia with pain [facility])   Discharge Condition: Stable  Diet recommendation: Regular  Filed Weights   10/18/15 1023  Weight: 125 lb (56.7 kg)    History of present illness:  Brittany Foster is a 30 y.o. female with history of sickle cell disease, opiate tolerant and frequent visits for sickle cell pain crisis with narcotic seeking behavior, presents to the day hospital with complaints of pain in her legs and lower back typical of her sickle cell pain. She has not been compliant with her sickle cell medications including hydroxyurea and folic acid, she only takes narcotics and always seeking more. She described a low grade fever since 3 days, she had a central line insertion during her last hospital admission, she was just discharged 4 days ago. She denies cough, no chest pain, no SOB, no urinary symptom. She claimed to have taken good care of her central line dressing since getting home.   Hospital Course:  Brittany Foster was admitted to the day hospital with sickle cell painful crisis. Patient was treated with weight based IV Dilaudid PCA, IV Toradol as well as IV fluids. CBCD showed Hb at baseline, no leucocytosis, UA was unremarkable. No recorded fever during her observation at the [facility] showed significant improvement symptomatically, pain improved from 8 to 6 out of 10 at the time of discharge. [place] will follow-up at the clinic as previously scheduled, continue with home medications as per prior to admission.  Discharge Exam: Vitals:   10/18/15 1336 10/18/15 1433  BP: (!) 103/54 (!) 103/58  Pulse: 78 78  Resp: 13 12  Temp:  97.6 F (36.4 C)   General appearance: alert, cooperative and no distress Eyes:  conjunctivae/corneas clear. PERRL, EOM's intact. Fundi benign. Neck: no adenopathy, no carotid bruit, no JVD, supple, symmetrical, trachea midline and thyroid not enlarged, symmetric, no tenderness/mass/nodules Back: symmetric, no curvature. ROM normal. No CVA tenderness. Resp: clear to auscultation bilaterally Chest wall: no tenderness Cardio: regular rate and rhythm, S1, S2 normal, no murmur, click, rub or gallop GI: soft, non-tender; bowel sounds normal; no masses, no organomegaly Extremities: extremities normal, atraumatic, no cyanosis or edema Pulses: 2+ and symmetric Skin: Skin color, texture, turgor normal. No rashes or lesions Neurologic: Grossly normal  Discharge Instructions We discussed the need for good hydration, monitoring of hydration status, avoidance of heat, cold, stress, and infection triggers. We discussed the need to be compliant with taking Hydrea. Ellason was reminded of the need to seek medical attention of any symptoms of bleeding, anemia, or infection occurs.  Discharge Instructions    Call MD for:  temperature >100.4    Complete by:  As directed   Diet - low sodium heart healthy    Complete by:  As directed   Increase activity slowly    Complete by:  As directed     Current Discharge Medication List    CONTINUE these medications which have NOT CHANGED   Details  DULoxetine (CYMBALTA) 60 MG capsule Take 1 capsule (60 mg total) by mouth daily. Qty: 30 capsule, Refills: 3   Associated Diagnoses: Chronic pain syndrome    folic acid (FOLVITE) 1 MG tablet Take 1 tablet (1 mg total) by mouth daily. Qty: 90 tablet, Refills: 11   Associated Diagnoses: Hb-SS disease  without crisis ([place])    hydroxyurea (HYDREA) 500 MG capsule TAKE 2 CAPSULES BY MOUTH DAILY. MAY TAKE WITH FOOD TO MINIMIZE GI SIDE EFFECTS. Qty: 60 capsule, Refills: 3   Associated Diagnoses: Hb-SS disease without crisis (HCC)    ibuprofen (ADVIL,MOTRIN) 200 MG tablet Take 400 mg by mouth every 6  (six) hours as needed for moderate pain.     morphine (MS CONTIN) 30 MG 12 hr tablet Take 1 tablet (30 mg total) by mouth every 12 (twelve) hours. Qty: 30 tablet, Refills: 0   Associated Diagnoses: Hb-SS disease without crisis (HCC)    Topiramate ER 100 MG CP24 Take 100 mg by mouth at bedtime.       Allergies  Allergen Reactions  . Ultram [Tramadol] Other (See Comments)    seizures  . Zofran [Ondansetron Hcl] Nausea And Vomiting  . Buprenorphine Hcl Hives and Rash    Shaking Tolerates Percocet, Norco, and buprenorphine  . Morphine And Related Hives, Rash and Other (See Comments)    Shaking Tolerates Percocet, Norco, Dilaudid, and buprenorphine  . Tape Rash     Significant Diagnostic Studies: Dg Chest 2 View  Result Date: 10/08/2015 CLINICAL DATA:  Sickle cell disease and complains of chest pain. Mild shortness of breath. EXAM: CHEST  2 VIEW COMPARISON:  09/02/2015 FINDINGS: Right subclavian Port-A-Cath has been removed and the patient now has a right jugular central line with the tip near the superior cavoatrial junction. There is a chronic catheter fragment in the left innominate vein that is poorly characterized on this examination. Both lungs are clear without pulmonary edema or airspace disease. Heart and mediastinum are within normal limits. The trachea is midline. No pleural effusions. No acute bone abnormality. IMPRESSION: No active cardiopulmonary disease. Central line tip at the superior cavoatrial junction. Electronically Signed   By: Markus Daft M.D.   On: 10/08/2015 16:53   Ir Fluoro Guide Cv Line Right  Result Date: 10/08/2015 INDICATION: History of sickle cell disease, in need of durable intravenous access for frequent blood draws and medication administration. Note, patient has history of prior Port a catheter placement however recently had Port a catheter removed secondary to bacteremia. As such, request made made for placement of a tunneled central venous catheter in  lieu of placement of a formal Port a Catheter. EXAM: TUNNELED PICC LINE WITH ULTRASOUND AND FLUOROSCOPIC GUIDANCE MEDICATIONS: None ANESTHESIA/SEDATION: None COMPLICATIONS: None immediate. PROCEDURE: Informed written consent was obtained from the patient after a discussion of the risks, benefits, and alternatives to treatment. Questions regarding the procedure were encouraged and answered. The right neck and chest were prepped with chlorhexidine in a sterile fashion, and a sterile drape was applied covering the operative field. Maximum barrier sterile technique with sterile gowns and gloves were used for the procedure. A timeout was performed prior to the initiation of the procedure. After creating a small venotomy incision, a micropuncture kit was utilized to access the right internal jugular vein under direct, real-time ultrasound guidance after the overlying soft tissues were anesthetized with 1% lidocaine with epinephrine. Ultrasound image documentation was performed. The microwire was kinked to measure appropriate catheter length. The micropuncture sheath was exchanged for a peel-away sheath over a guidewire. A 5 French single lumen tunneled PICC measuring 20 cm was tunneled in a retrograde fashion from the anterior chest wall to the venotomy incision. The catheter was then placed through the peel-away sheath with tip ultimately positioned at the superior caval-atrial junction. Final catheter positioning was confirmed and documented  with a spot radiographic image. The catheter aspirates and flushes normally. The catheter was flushed with appropriate volume heparin dwells. The catheter exit site was secured with a 0-Prolene retention suture. The venotomy incision was closed with an interrupted 4-0 Vicryl, Dermabond and Steri-strips. Dressings were applied. The patient tolerated the procedure well without immediate post procedural complication. FINDINGS: After catheter placement, the tip lies within the superior  cavoatrial junction. The catheter aspirates and flushes normally and is ready for immediate use. Re- demonstrated abandoned catheter tubing overlying the expected location of the central aspect of the left innominate vein. IMPRESSION: Successful placement of 20 cm single lumen tunneled PICC catheter via the right internal jugular vein with tip terminating at the superior caval atrial junction. The catheter is ready for immediate use. Electronically Signed   By: Sandi Mariscal M.D.   On: 10/08/2015 17:04   Ir US Guide Vasc Access Right  Result Date: 10/08/2015 INDICATION: History of sickle cell disease, in need of durable intravenous access for frequent blood draws and medication administration. Note, patient has history of prior Port a catheter placement however recently had Port a catheter removed secondary to bacteremia. As such, request made made for placement of a tunneled central venous catheter in lieu of placement of a formal Port a Catheter. EXAM: TUNNELED PICC LINE WITH ULTRASOUND AND FLUOROSCOPIC GUIDANCE MEDICATIONS: None ANESTHESIA/SEDATION: None COMPLICATIONS: None immediate. PROCEDURE: Informed written consent was obtained from the patient after a discussion of the risks, benefits, and alternatives to treatment. Questions regarding the procedure were encouraged and answered. The right neck and chest were prepped with chlorhexidine in a sterile fashion, and a sterile drape was applied covering the operative field. Maximum barrier sterile technique with sterile gowns and gloves were used for the procedure. A timeout was performed prior to the initiation of the procedure. After creating a small venotomy incision, a micropuncture kit was utilized to access the right internal jugular vein under direct, real-time ultrasound guidance after the overlying soft tissues were anesthetized with 1% lidocaine with epinephrine. Ultrasound image documentation was performed. The microwire was kinked to measure  appropriate catheter length. The micropuncture sheath was exchanged for a peel-away sheath over a guidewire. A 5 French single lumen tunneled PICC measuring 20 cm was tunneled in a retrograde fashion from the anterior chest wall to the venotomy incision. The catheter was then placed through the peel-away sheath with tip ultimately positioned at the superior caval-atrial junction. Final catheter positioning was confirmed and documented with a spot radiographic image. The catheter aspirates and flushes normally. The catheter was flushed with appropriate volume heparin dwells. The catheter exit site was secured with a 0-Prolene retention suture. The venotomy incision was closed with an interrupted 4-0 Vicryl, Dermabond and Steri-strips. Dressings were applied. The patient tolerated the procedure well without immediate post procedural complication. FINDINGS: After catheter placement, the tip lies within the superior cavoatrial junction. The catheter aspirates and flushes normally and is ready for immediate use. Re- demonstrated abandoned catheter tubing overlying the expected location of the central aspect of the left innominate vein. IMPRESSION: Successful placement of 20 cm single lumen tunneled PICC catheter via the right internal jugular vein with tip terminating at the superior caval atrial junction. The catheter is ready for immediate use. Electronically Signed   By: Sandi Mariscal M.D.   On: 10/08/2015 17:04    Signed:  Angelica Chessman MD, [place], [place], [place], CPE   10/18/2015, 3:05 PM

## 2015-10-18 NOTE — H&P (Signed)
[facility] History and Physical  Brittany CampbellMiranda Foster VQQ:[id number] DOB: 05/12/1985 DOA: 10/18/2015  PCP: Jeanann LewandowskyJEGEDE, Azlan Hanway, MD   Chief Complaint: Pain in legs and lower back  HPI: Brittany CampbellMiranda Foster is a 30 y.o. female with history of sickle cell disease, opiate tolerant and frequent visits for sickle cell pain crisis with narcotic seeking behavior, presents to the day hospital with complaints of pain in her legs and lower back typical of her sickle cell pain. She has not been compliant with her sickle cell medications including hydroxyurea and folic acid, she only takes narcotics and always seeking more. She described a low grade fever since 3 days, she had a central line insertion during her last hospital admission, she was just discharged 4 days ago. She denies cough, no chest pain, no SOB, no urinary symptom. She claimed to have taken good care of her central line dressing since getting home.   Systemic Review: General: The patient denies anorexia, fever, weight loss Cardiac: Denies chest pain, syncope, palpitations, pedal edema  Respiratory: Denies cough, shortness of breath, wheezing GI: Denies severe indigestion/heartburn, abdominal pain, nausea, vomiting, diarrhea and constipation GU: Denies hematuria, incontinence, dysuria  Musculoskeletal: Denies arthritis  Skin: Denies suspicious skin lesions Neurologic: Denies focal weakness or numbness, change in vision  Past Medical History:  Diagnosis Date  . Anemia   . Depression, major, recurrent (HCC)   . Migraines   . Sickle cell anemia (HCC)     Past Surgical History:  Procedure Laterality Date  . CESAREAN SECTION    . CHOLECYSTECTOMY  2000  . IR GENERIC HISTORICAL  10/08/2015   IR US GUIDE VASC ACCESS RIGHT 10/08/2015 Simonne ComeJohn Watts, MD [facility]INTER[facility]  . IR GENERIC HISTORICAL  10/08/2015   IR FLUORO GUIDE CV LINE RIGHT 10/08/2015 Simonne ComeJohn Watts, MD [facility]  . MULTIPLE TOOTH EXTRACTIONS N/A   . port a cath placement Right    about  6-7 years ago  . removal of porta cath Right 09/11/15  . TUBAL LIGATION      Allergies  Allergen Reactions  . Ultram [Tramadol] Other (See Comments)    seizures  . Zofran [Ondansetron Hcl] Nausea And Vomiting  . Buprenorphine Hcl Hives and Rash    Shaking Tolerates Percocet, Norco, and buprenorphine  . Morphine And Related Hives, Rash and Other (See Comments)    Shaking Tolerates Percocet, Norco, Dilaudid, and buprenorphine  . Tape Rash    Family History  Problem Relation Age of Onset  . Sickle cell trait Father   . Sickle cell trait Mother   . Sickle cell anemia Other       Prior to Admission medications   Medication Sig Start Date End Date Taking? Authorizing Provider  DULoxetine (CYMBALTA) 60 MG capsule Take 1 capsule (60 mg total) by mouth daily. 08/08/15   Massie MaroonLachina M [name], FNP  folic acid (FOLVITE) 1 MG tablet Take 1 tablet (1 mg total) by mouth daily. 08/08/15   Massie MaroonLachina M [name], FNP  hydroxyurea (HYDREA) 500 MG capsule TAKE 2 CAPSULES BY MOUTH DAILY. MAY TAKE WITH FOOD TO MINIMIZE GI SIDE EFFECTS. Patient taking differently: Take 500 mg by mouth daily. MAY TAKE WITH FOOD TO MINIMIZE GI SIDE EFFECTS. 08/08/15   Massie MaroonLachina M [name], FNP  ibuprofen (ADVIL,MOTRIN) 200 MG tablet Take 400 mg by mouth every 6 (six) hours as needed for moderate pain.     Historical Provider, MD  morphine (MS CONTIN) 30 MG 12 hr tablet Take 1 tablet (30 mg total) by  mouth every 12 (twelve) hours. 10/04/15   Quentin Angstlugbemiga E Courteney Alderete, MD  Topiramate ER 100 MG CP24 Take 100 mg by mouth at bedtime.    Historical Provider, MD     Physical Exam: There were no vitals filed for this visit.  General: Alert, awake, afebrile, anicteric, not in obvious distress HEENT: Normocephalic and Atraumatic, Mucous membranes pink                PERRLA; EOM intact; No scleral icterus,                 Nares: Patent, Oropharynx: Clear, Fair Dentition                 Neck: FROM, no cervical lymphadenopathy, thyromegaly, carotid  bruit or JVD;  CHEST WALL: No tenderness  CHEST: Normal respiration, clear to auscultation bilaterally  HEART: Regular rate and rhythm; no murmurs rubs or gallops  BACK: No kyphosis or scoliosis; no CVA tenderness  ABDOMEN: Positive Bowel Sounds, soft, non-tender; no masses, no organomegaly EXTREMITIES: No cyanosis, clubbing, or edema SKIN:  no rash or ulceration  CNS: Alert and Oriented x 4, Nonfocal exam, CN 2-12 intact  Labs on Admission:  Basic Metabolic Panel:  Recent Labs Lab 10/11/15 1200  NA 137  K 3.9  CL 110  CO2 23  GLUCOSE 125*  BUN <5*  CREATININE 0.39*  CALCIUM 9.0   Liver Function Tests: No results for input(s): AST, ALT, ALKPHOS, BILITOT, PROT, ALBUMIN in the last 168 hours. No results for input(s): LIPASE, AMYLASE in the last 168 hours. No results for input(s): AMMONIA in the last 168 hours. CBC:  Recent Labs Lab 10/11/15 1200 10/13/15 0800  WBC 13.5* 9.8  NEUTROABS 8.1* 5.2  HGB 7.8* 7.3*  HCT 22.3* 20.7*  MCV 103.2* 104.5*  PLT 395 301   Cardiac Enzymes: No results for input(s): CKTOTAL, CKMB, CKMBINDEX, TROPONINI in the last 168 hours.  BNP (last 3 results) No results for input(s): BNP in the last 8760 hours.  ProBNP (last 3 results) No results for input(s): PROBNP in the last 8760 hours.  CBG: No results for input(s): GLUCAP in the last 168 hours.   Assessment/Plan Active Problems:   Sickle cell anemia with pain (HCC)   Admits to the Day Hospital,   Check CBCD and UA. Clean and dress central line insertion site. If leukocytosis, will obtain blood culture.   IVF D5 .45% Saline @ 125 mls/hour  Weight based Dilaudid PCA started within 30 minutes of admission  IV Toradol 30 mg Q 6 H  Monitor vitals very closely, Re-evaluate pain scale every hour  2 L of Oxygen by [place]  Patient will be re-evaluated for pain in the context of function and relationship to baseline as care progresses.  If no significant relieve from pain (remains  above 5/10) will transfer patient to inpatient services for further evaluation and management  Code Status: Full  Family Communication: None  DVT Prophylaxis: Ambulate as tolerated   Time spent: 35 Minutes  Maclane Holloran, MD, MHA, FACP, FAAP, CPE  If 7PM-7AM, please contact night-coverage [email or web address] 10/18/2015, 10:23 AM

## 2015-10-18 NOTE — Telephone Encounter (Signed)
Notified Bettie to [facility] for [facility] for pain after speaking to Dr. Hyman HopesJegede. Patient will come in.

## 2015-10-18 NOTE — Discharge Instructions (Signed)
Call MD for Temp. 100.4 or above Increase activity slowly Heart healthy diet, low sodium diet.   Sickle Cell Anemia, Adult Sickle cell anemia is a condition where your red blood cells are shaped like sickles. Red blood cells carry oxygen through the body. Sickle-shaped red blood cells do not live as long as normal red blood cells. They also clump together and block blood from flowing through the blood vessels. These things prevent the body from getting enough oxygen. Sickle cell anemia causes organ damage and pain. It also increases the risk of infection. HOME CARE  Drink enough fluid to keep your pee (urine) clear or pale yellow. Drink more in hot weather and during exercise.  Do not smoke. Smoking lowers oxygen levels in the blood.  Only take over-the-counter or prescription medicines as told by your doctor.  Take antibiotic medicines as told by your doctor. Make sure you finish them even if you start to feel better.  Take supplements as told by your doctor.  Consider wearing a medical alert bracelet. This tells anyone caring for you in an emergency of your condition.  When traveling, keep your medical information, doctors' names, and the medicines you take with you at all times.  If you have a fever, do not take fever medicines right away. This could cover up a problem. Tell your doctor.   Keep all follow-up visits with your doctor. Sickle cell anemia requires regular medical care. GET HELP IF: You have a fever. GET HELP RIGHT AWAY IF:  You feel dizzy or faint.  You have new belly (abdominal) pain, especially on the left side near the stomach area.  You have a lasting, often uncomfortable and painful erection of the penis (priapism). If it is not treated right away, you will become unable to have sex (impotence).  You have numbness in your arms or legs or you have a hard time moving them.  You have a hard time talking.  You have a fever or lasting symptoms for more than 2-3  days.  You have a fever and your symptoms suddenly get worse.  You have signs or symptoms of infection. These include:  Chills.  Being more tired than normal (lethargy).  Irritability.  Poor eating.  Throwing up (vomiting).  You have pain that is not helped with medicine.  You have shortness of breath.  You have pain in your chest.  You are coughing up pus-like or bloody mucus.  You have a stiff neck.  Your feet or hands swell or have pain.  Your belly looks bloated.  Your joints hurt. MAKE SURE YOU:  Understand these instructions.  Will watch your condition.  Will get help right away if you are not doing well or get worse.   This information is not intended to replace advice given to you by your health care provider. Make sure you discuss any questions you have with your health care provider.   Document Released: 12/07/2012 Document Revised: 07/03/2014 Document Reviewed: 12/07/2012 Elsevier Interactive Patient Education [employer]2016 [employer] Inc.

## 2015-10-19 ENCOUNTER — Emergency Department (HOSPITAL_COMMUNITY): Payer: Medicaid Other

## 2015-10-19 ENCOUNTER — Inpatient Hospital Stay (HOSPITAL_COMMUNITY)
Admission: EM | Admit: 2015-10-19 | Discharge: 2015-10-27 | DRG: 812 | Disposition: A | Discharge status: Home / Self Care | Payer: Medicaid Other | Attending: Internal Medicine | Admitting: Internal Medicine

## 2015-10-19 ENCOUNTER — Encounter (HOSPITAL_COMMUNITY): Payer: Self-pay | Admitting: *Deleted

## 2015-10-19 DIAGNOSIS — R079 Chest pain, unspecified: Secondary | ICD-10-CM | POA: Diagnosis present

## 2015-10-19 DIAGNOSIS — D638 Anemia in other chronic diseases classified elsewhere: Secondary | ICD-10-CM | POA: Diagnosis present

## 2015-10-19 DIAGNOSIS — I950 Idiopathic hypotension: Secondary | ICD-10-CM | POA: Diagnosis not present

## 2015-10-19 DIAGNOSIS — Z9049 Acquired absence of other specified parts of digestive tract: Secondary | ICD-10-CM

## 2015-10-19 DIAGNOSIS — Z888 Allergy status to other drugs, medicaments and biological substances status: Secondary | ICD-10-CM | POA: Diagnosis not present

## 2015-10-19 DIAGNOSIS — F339 Major depressive disorder, recurrent, unspecified: Secondary | ICD-10-CM | POA: Diagnosis present

## 2015-10-19 DIAGNOSIS — Z9114 Patient's other noncompliance with medication regimen: Secondary | ICD-10-CM

## 2015-10-19 DIAGNOSIS — Z95828 Presence of other vascular implants and grafts: Secondary | ICD-10-CM | POA: Diagnosis not present

## 2015-10-19 DIAGNOSIS — D5700 Hb-SS disease with crisis, unspecified: Principal | ICD-10-CM | POA: Diagnosis present

## 2015-10-19 DIAGNOSIS — G894 Chronic pain syndrome: Secondary | ICD-10-CM | POA: Diagnosis present

## 2015-10-19 DIAGNOSIS — I959 Hypotension, unspecified: Secondary | ICD-10-CM | POA: Diagnosis present

## 2015-10-19 DIAGNOSIS — Z79891 Long term (current) use of opiate analgesic: Secondary | ICD-10-CM | POA: Diagnosis not present

## 2015-10-19 DIAGNOSIS — F17210 Nicotine dependence, cigarettes, uncomplicated: Secondary | ICD-10-CM | POA: Diagnosis present

## 2015-10-19 DIAGNOSIS — D72829 Elevated white blood cell count, unspecified: Secondary | ICD-10-CM | POA: Diagnosis present

## 2015-10-19 DIAGNOSIS — E876 Hypokalemia: Secondary | ICD-10-CM | POA: Diagnosis present

## 2015-10-19 DIAGNOSIS — G43909 Migraine, unspecified, not intractable, without status migrainosus: Secondary | ICD-10-CM | POA: Diagnosis present

## 2015-10-19 DIAGNOSIS — Z885 Allergy status to narcotic agent status: Secondary | ICD-10-CM

## 2015-10-19 DIAGNOSIS — Z91048 Other nonmedicinal substance allergy status: Secondary | ICD-10-CM

## 2015-10-19 DIAGNOSIS — Z79899 Other long term (current) drug therapy: Secondary | ICD-10-CM | POA: Diagnosis not present

## 2015-10-19 LAB — CBC WITH DIFFERENTIAL/PLATELET
BASOS ABS: 0 10*3/uL (ref 0.0–0.1)
BASOS PCT: 0 %
EOS ABS: 0 10*3/uL (ref 0.0–0.7)
Eosinophils Relative: 0 %
HEMATOCRIT: 24.7 % — AB (ref 36.0–46.0)
HEMOGLOBIN: 8.7 g/dL — AB (ref 12.0–15.0)
Lymphocytes Relative: 15 %
Lymphs Abs: 2.4 10*3/uL (ref 0.7–4.0)
MCH: 36.1 pg — ABNORMAL HIGH (ref 26.0–34.0)
MCHC: 35.2 g/dL (ref 30.0–36.0)
MCV: 102.5 fL — ABNORMAL HIGH (ref 78.0–100.0)
Monocytes Absolute: 1.5 10*3/uL — ABNORMAL HIGH (ref 0.1–1.0)
Monocytes Relative: 10 %
NEUTROS ABS: 11.5 10*3/uL — AB (ref 1.7–7.7)
NEUTROS PCT: 75 %
Platelets: 394 10*3/uL (ref 150–400)
RBC: 2.41 MIL/uL — AB (ref 3.87–5.11)
RDW: 17 % — ABNORMAL HIGH (ref 11.5–15.5)
WBC: 15.3 10*3/uL — AB (ref 4.0–10.5)

## 2015-10-19 LAB — COMPREHENSIVE METABOLIC PANEL
ALBUMIN: 4.4 g/dL (ref 3.5–5.0)
ALT: 24 U/L (ref 14–54)
ANION GAP: 6 (ref 5–15)
AST: 35 U/L (ref 15–41)
Alkaline Phosphatase: 69 U/L (ref 38–126)
BILIRUBIN TOTAL: 1.9 mg/dL — AB (ref 0.3–1.2)
BUN: 6 mg/dL (ref 6–20)
CHLORIDE: 113 mmol/L — AB (ref 101–111)
CO2: 20 mmol/L — ABNORMAL LOW (ref 22–32)
Calcium: 9.1 mg/dL (ref 8.9–10.3)
Creatinine, Ser: 0.31 mg/dL — ABNORMAL LOW (ref 0.44–1.00)
GFR calc Af Amer: >60 mL/min (ref >60)
GFR calc non Af Amer: >60 mL/min (ref >60)
GLUCOSE: 89 mg/dL (ref 65–99)
POTASSIUM: 3.3 mmol/L — AB (ref 3.5–5.1)
Sodium: 139 mmol/L (ref 135–145)
TOTAL PROTEIN: 7.4 g/dL (ref 6.5–8.1)

## 2015-10-19 LAB — RETICULOCYTES
RBC.: 2.41 MIL/uL — AB (ref 3.87–5.11)
RETIC COUNT ABSOLUTE: 291.6 10*3/uL — AB (ref 19.0–186.0)
RETIC CT PCT: 12.1 % — AB (ref 0.4–3.1)

## 2015-10-19 LAB — D-DIMER, QUANTITATIVE: D-Dimer, Quant: 1.43 ug/mL-FEU — ABNORMAL HIGH (ref 0.00–0.50)

## 2015-10-19 LAB — I-STAT TROPONIN, ED: TROPONIN I, POC: 0.01 ng/mL (ref 0.00–0.08)

## 2015-10-19 LAB — I-STAT BETA HCG BLOOD, ED (MC, WL, AP ONLY)

## 2015-10-19 LAB — TROPONIN I

## 2015-10-19 MED ORDER — SODIUM CHLORIDE 0.9% FLUSH
10.0000 mL | INTRAVENOUS | Status: DC | PRN
  Administered 2015-10-20 – 2015-10-27 (×4): 10 mL
  Filled 2015-10-19 (×4): qty 40

## 2015-10-19 MED ORDER — DIPHENHYDRAMINE HCL 50 MG/ML IJ SOLN
25.0000 mg | INTRAMUSCULAR | Status: DC | PRN
Start: 2015-10-19 — End: 2015-10-21
  Administered 2015-10-19 – 2015-10-21 (×9): 25 mg via INTRAVENOUS
  Filled 2015-10-19 (×20): qty 0.5

## 2015-10-19 MED ORDER — DEXTROSE-NACL 5-0.45 % IV SOLN
INTRAVENOUS | Status: DC
  Administered 2015-10-19 – 2015-10-24 (×13): via INTRAVENOUS

## 2015-10-19 MED ORDER — IOPAMIDOL (ISOVUE-370) INJECTION 76%
100.0000 mL | Freq: Once | INTRAVENOUS | Status: AC | PRN
  Administered 2015-10-19: 100 mL via INTRAVENOUS

## 2015-10-19 MED ORDER — KETOROLAC TROMETHAMINE 15 MG/ML IJ SOLN
15.0000 mg | Freq: Four times a day (QID) | INTRAMUSCULAR | Status: DC
  Administered 2015-10-19 – 2015-10-20 (×3): 15 mg via INTRAVENOUS
  Filled 2015-10-19 (×3): qty 1

## 2015-10-19 MED ORDER — NALOXONE HCL 0.4 MG/ML IJ SOLN: 0.4000 mg | INTRAMUSCULAR | Status: DC | PRN

## 2015-10-19 MED ORDER — HYDROXYUREA 500 MG PO CAPS
500.0000 mg | ORAL_CAPSULE | Freq: Every day | ORAL | Status: DC
  Administered 2015-10-19 – 2015-10-27 (×9): 500 mg via ORAL
  Filled 2015-10-19 (×9): qty 1

## 2015-10-19 MED ORDER — TOPIRAMATE 100 MG PO TABS
100.0000 mg | ORAL_TABLET | Freq: Every day | ORAL | Status: DC
  Administered 2015-10-19 – 2015-10-26 (×8): 100 mg via ORAL
  Filled 2015-10-19 (×8): qty 1

## 2015-10-19 MED ORDER — MORPHINE SULFATE ER 30 MG PO TBCR
30.0000 mg | EXTENDED_RELEASE_TABLET | Freq: Two times a day (BID) | ORAL | Status: DC
  Administered 2015-10-19 – 2015-10-27 (×16): 30 mg via ORAL
  Filled 2015-10-19 (×16): qty 1

## 2015-10-19 MED ORDER — HYDROMORPHONE HCL 2 MG/ML IJ SOLN
2.0000 mg | Freq: Once | INTRAMUSCULAR | Status: AC
  Administered 2015-10-19: 2 mg via INTRAVENOUS
  Filled 2015-10-19: qty 1

## 2015-10-19 MED ORDER — HYDROMORPHONE HCL 1 MG/ML IJ SOLN
1.0000 mg | Freq: Once | INTRAMUSCULAR | Status: AC
  Administered 2015-10-19: 1 mg via INTRAVENOUS
  Filled 2015-10-19: qty 1

## 2015-10-19 MED ORDER — DULOXETINE HCL 60 MG PO CPEP
60.0000 mg | ORAL_CAPSULE | Freq: Every day | ORAL | Status: DC
  Administered 2015-10-19 – 2015-10-27 (×9): 60 mg via ORAL
  Filled 2015-10-19 (×9): qty 1

## 2015-10-19 MED ORDER — HYDROXYZINE HCL 25 MG PO TABS
25.0000 mg | ORAL_TABLET | ORAL | Status: DC | PRN
  Administered 2015-10-26: 50 mg via ORAL
  Filled 2015-10-19: qty 2

## 2015-10-19 MED ORDER — BISACODYL 5 MG PO TBEC: 5.0000 mg | DELAYED_RELEASE_TABLET | Freq: Every day | ORAL | Status: DC | PRN

## 2015-10-19 MED ORDER — PROMETHAZINE HCL 25 MG/ML IJ SOLN
12.5000 mg | Freq: Once | INTRAMUSCULAR | Status: AC
  Administered 2015-10-19: 12.5 mg via INTRAVENOUS
  Filled 2015-10-19: qty 1

## 2015-10-19 MED ORDER — ENOXAPARIN SODIUM 40 MG/0.4ML ~~LOC~~ SOLN
40.0000 mg | SUBCUTANEOUS | Status: DC
  Administered 2015-10-19 – 2015-10-20 (×2): 40 mg via SUBCUTANEOUS
  Filled 2015-10-19 (×7): qty 0.4

## 2015-10-19 MED ORDER — DIPHENHYDRAMINE HCL 25 MG PO CAPS
25.0000 mg | ORAL_CAPSULE | ORAL | Status: DC | PRN
  Administered 2015-10-21: 50 mg via ORAL
  Filled 2015-10-19 (×2): qty 2

## 2015-10-19 MED ORDER — POLYETHYLENE GLYCOL 3350 17 G PO PACK: 17.0000 g | PACK | Freq: Every day | ORAL | Status: DC | PRN

## 2015-10-19 MED ORDER — HYDROMORPHONE 1 MG/ML IV SOLN
INTRAVENOUS | Status: DC
  Administered 2015-10-19: 3.5 mg via INTRAVENOUS
  Administered 2015-10-19: 22:00:00 via INTRAVENOUS
  Administered 2015-10-20: 8 mg via INTRAVENOUS
  Administered 2015-10-20: 7 mg via INTRAVENOUS
  Administered 2015-10-20: 6 mg via INTRAVENOUS
  Administered 2015-10-20: 6.5 mg via INTRAVENOUS
  Administered 2015-10-20: 8 mg via INTRAVENOUS
  Administered 2015-10-21: 6 mg via INTRAVENOUS
  Administered 2015-10-21: 4.5 mg via INTRAVENOUS
  Administered 2015-10-21: 6 mg via INTRAVENOUS
  Administered 2015-10-21: 5 mg via INTRAVENOUS
  Administered 2015-10-21: 13:00:00 via INTRAVENOUS
  Administered 2015-10-21: 6.5 mg via INTRAVENOUS
  Filled 2015-10-19 (×4): qty 25

## 2015-10-19 MED ORDER — DIPHENHYDRAMINE HCL 50 MG/ML IJ SOLN
25.0000 mg | Freq: Once | INTRAMUSCULAR | Status: AC
  Administered 2015-10-19: 25 mg via INTRAVENOUS
  Filled 2015-10-19: qty 1

## 2015-10-19 MED ORDER — SENNOSIDES-DOCUSATE SODIUM 8.6-50 MG PO TABS
1.0000 | ORAL_TABLET | Freq: Two times a day (BID) | ORAL | Status: DC
  Administered 2015-10-19: 1 via ORAL
  Filled 2015-10-19 (×15): qty 1

## 2015-10-19 MED ORDER — FOLIC ACID 1 MG PO TABS
1.0000 mg | ORAL_TABLET | Freq: Every day | ORAL | Status: DC
  Administered 2015-10-19 – 2015-10-27 (×9): 1 mg via ORAL
  Filled 2015-10-19 (×9): qty 1

## 2015-10-19 MED ORDER — FLEET ENEMA 7-19 GM/118ML RE ENEM
1.0000 | ENEMA | Freq: Once | RECTAL | Status: DC | PRN
Start: 2015-10-19 — End: 2015-10-27

## 2015-10-19 MED ORDER — SODIUM CHLORIDE 0.9% FLUSH
9.0000 mL | INTRAVENOUS | Status: DC | PRN
Start: 2015-10-19 — End: 2015-10-21

## 2015-10-19 NOTE — ED Provider Notes (Signed)
I assumed care of this patient from Dr. Manus Gunningancour at 1600.  Please see their note for further details of Hx, PE.  Briefly patient is a 30 y.o. female with a Sickle Cell Pain Crisis  that is concerning for sickle cell crisis. Given the chest pain with positive d-dimer, plan is to rule out PE.  Current plan is to follow-up on results and reassess the patient to see if her pains improved. Next  Dr. troponins negative. CTA negative for PE. After reassessment patient with continued pain. Will require admission for pain control.   Disposition: Admit  Condition: stable      Nira ConnPedro Eduardo Cardama, MD 10/19/15 2034

## 2015-10-19 NOTE — ED Triage Notes (Addendum)
Patient c/o pain "everywhere", chest, legs, back, arms, x4 hours.  Patient states she does not typically have chest pain with [place].  Patient endorses nausea, no vomiting and low grade fever (100 degrees).  Patient has PICC line for [place].  Her portacath was moved.

## 2015-10-19 NOTE — ED Provider Notes (Signed)
[facility]-EMERGENCY DEPT Provider Note   CSN: [id number] Arrival date & time: 10/19/15  1210     History   Chief Complaint Chief Complaint  Patient presents with  . Sickle Cell Pain Crisis    HPI Brittany Foster is a 30 y.o. female.  Patient with history of sickle cell disease presenting with diffuse pain in her legs, arms, back somewhat to previous sickle cell exacerbations not relieved by pain medication at home. States fever to 100.2  days ago. Also has pain to her chest that onset today which is unlike her sickle cell exacerbations. Has pain with deep breathing and feels short of breath but no coughing. Recent hospitalization for sickle cell crisis last week. States compliance with her medications. Denies abdominal pain, nausea, vomiting, diarrhea. No focal weakness, or tingling.   The history is provided by the patient.  Sickle Cell Pain Crisis  Associated symptoms: chest pain   Associated symptoms: no congestion, no cough, no fatigue, no fever, no headaches, no nausea, no shortness of breath and no vomiting     Past Medical History:  Diagnosis Date  . Anemia   . Depression, major, recurrent (HCC)   . Migraines   . Sickle cell anemia [facility](HCC)     Patient Active Problem List   Diagnosis Date Noted  . Vasoocclusive sickle cell crisis (HCC) 08/21/2015  . Depression, major, recurrent (HCC)   . Family planning, Depo-Provera contraception monitoring/administration 08/08/2015  . Sickle cell anemia with crisis (HCC) 07/12/2015  . Sickle cell anemia with pain (HCC) 05/28/2015  . Intractable pain   . Chest pain 03/07/2015  . Leukocytosis 03/07/2015  . Vitamin D deficiency 01/08/2015  . Chronic pain syndrome 01/08/2015  . Sickle cell crisis (HCC) 10/22/2014  . Hypokalemia 08/21/2014  . Depression 08/09/2014  . 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 Sickle cell disease with crisis (HCC) 07/12/2014  . Hb-SS disease with crisis (HCC) 06/11/2014  . Pericardial effusion   .  Anemia 02/09/2014  . Neuropathy (HCC) 02/09/2014  . Chronic headache disorder 02/09/2014  . Abnormal CT scan, chest   . Sickle cell pain crisis (HCC) 12/30/2013    Past Surgical History:  Procedure Laterality Date  . CESAREAN SECTION    . CHOLECYSTECTOMY  2000  . IR GENERIC HISTORICAL  10/08/2015   IR US GUIDE VASC ACCESS RIGHT 10/08/2015 Simonne ComeJohn Watts, MD [facility]-INTERV RAD  . IR GENERIC HISTORICAL  10/08/2015   IR FLUORO GUIDE CV LINE RIGHT 10/08/2015 Simonne ComeJohn Watts, MD [facility]INTER[facility]  . MULTIPLE TOOTH EXTRACTIONS N/A   . port a cath placement Right    about 6-7 years ago  . removal of porta cath Right 09/11/15  . TUBAL LIGATION      OB History    No data available       Home Medications    Prior to Admission medications   Medication Sig Start Date End Date Taking? Authorizing Provider  DULoxetine (CYMBALTA) 60 MG capsule Take 1 capsule (60 mg total) by mouth daily. 08/08/15  Yes Massie MaroonLachina M [name], FNP  folic acid (FOLVITE) 1 MG tablet Take 1 tablet (1 mg total) by mouth daily. 08/08/15  Yes Massie MaroonLachina M [name], FNP  hydroxyurea (HYDREA) 500 MG capsule TAKE 2 CAPSULES BY MOUTH DAILY. MAY TAKE WITH FOOD TO MINIMIZE GI SIDE EFFECTS. Patient taking differently: Take 500 mg by mouth daily. MAY TAKE WITH FOOD TO MINIMIZE GI SIDE EFFECTS. 08/08/15  Yes Massie MaroonLachina M [name], FNP  ibuprofen (ADVIL,MOTRIN) 200 MG tablet Take 400  mg by mouth every 6 (six) hours as needed for moderate pain.    Yes Historical Provider, MD  morphine (MS CONTIN) 30 MG 12 hr tablet Take 1 tablet (30 mg total) by mouth every 12 (twelve) hours. 10/04/15  Yes Quentin Angst, MD  oxyCODONE-acetaminophen (PERCOCET) 10-325 MG tablet Take 1 tablet by mouth every 4 (four) hours as needed for pain.   Yes Historical Provider, MD  Topiramate ER 100 MG CP24 Take 100 mg by mouth at bedtime.   Yes Historical Provider, MD    Family History Family History  Problem Relation Age of Onset  . Sickle cell trait Father   . Sickle cell trait Mother   .  Sickle cell anemia Other     Social History Social History  Substance Use Topics  . Smoking status: Current Some Day Smoker  . Smokeless tobacco: Never Used     Comment: 08/21/15 - only smokes occasionally  . Alcohol use No     Allergies   Ultram [tramadol]; Zofran [ondansetron hcl]; Buprenorphine hcl; Morphine and related; and Tape   Review of Systems Review of Systems  Constitutional: Negative for activity change, appetite change, fatigue and fever.  HENT: Negative for congestion and rhinorrhea.   Eyes: Negative for visual disturbance.  Respiratory: Positive for chest tightness. Negative for cough and shortness of breath.   Cardiovascular: Positive for chest pain.  Gastrointestinal: Negative for abdominal pain, nausea and vomiting.  Genitourinary: Negative for dysuria and hematuria.  Musculoskeletal: Positive for arthralgias and myalgias.  Skin: Negative for rash.  Neurological: Negative for dizziness, weakness and headaches.   A complete 10 system review of systems was obtained and all systems are negative except as noted in the HPI and PMH.    Physical Exam Updated Vital Signs BP 108/73 (BP Location: Left Arm)   Pulse 94   Temp 97.6 F (36.4 C) (Rectal)   Resp 16   LMP 08/01/2015 (Approximate) Comment: depo shot after last period  SpO2 100%   Physical Exam  Constitutional: She is oriented to person, place, and time. She appears well-developed and well-nourished. No distress.  HENT:  Head: Normocephalic and atraumatic.  Mouth/Throat: Oropharynx is clear and moist. No oropharyngeal exudate.  Eyes: Conjunctivae and EOM are normal. Pupils are equal, round, and reactive to light.  Neck: Normal range of motion. Neck supple.  No meningismus.  Cardiovascular: Normal rate, regular rhythm, normal heart sounds and intact distal pulses.   No murmur heard. Pulmonary/Chest: Effort normal and breath sounds normal. No respiratory distress. She exhibits tenderness.  Abdominal:  Soft. There is no tenderness. There is no rebound and no guarding.  Musculoskeletal: Normal range of motion. She exhibits no edema or tenderness.  FROM joints without erythema or edema  Neurological: She is alert and oriented to person, place, and time. No cranial nerve deficit. She exhibits normal muscle tone. Coordination normal.  No ataxia on finger to nose bilaterally. No pronator drift. 5/5 strength throughout. CN 2-12 intact.Equal grip strength. Sensation intact.   Skin: Skin is warm.  Psychiatric: She has a normal mood and affect. Her behavior is normal.  Nursing note and vitals reviewed.    ED Treatments / Results  Labs (all labs ordered are listed, but only abnormal results are displayed) Labs Reviewed  CBC WITH DIFFERENTIAL/PLATELET - Abnormal; Notable for the following:       Result Value   WBC 15.3 (*)    RBC 2.41 (*)    Hemoglobin 8.7 (*)  HCT 24.7 (*)    MCV 102.5 (*)    MCH 36.1 (*)    RDW 17.0 (*)    Neutro Abs 11.5 (*)    Monocytes Absolute 1.5 (*)    All other components within normal limits  RETICULOCYTES - Abnormal; Notable for the following:    Retic Ct Pct 12.1 (*)    RBC. 2.41 (*)    Retic Count, Manual 291.6 (*)    All other components within normal limits  COMPREHENSIVE METABOLIC PANEL - Abnormal; Notable for the following:    Potassium 3.3 (*)    Chloride 113 (*)    CO2 20 (*)    Creatinine, Ser 0.31 (*)    Total Bilirubin 1.9 (*)    All other components within normal limits  D-DIMER, QUANTITATIVE (NOT AT [facility]) - Abnormal; Notable for the following:    D-Dimer, Quant 1.43 (*)    All other components within normal limits  URINE CULTURE  CULTURE, BLOOD (ROUTINE X 2)  CULTURE, BLOOD (ROUTINE X 2)  TROPONIN I  I-STAT BETA HCG BLOOD, ED ([facility], [facility], [facility] ONLY)    EKG  EKG Interpretation  Date/Time:  Saturday October 19 2015 13:27:33 EDT Ventricular Rate:  84 PR Interval:    QRS Duration: 85 QT Interval:  382 QTC Calculation: 452 R  Axis:   45 Text Interpretation:  Sinus rhythm No significant change was found Confirmed by Manus GunningANCOUR  MD, Arie Powell [phone]([id number]) on 10/19/2015 1:37:58 PM       Radiology Dg Chest 2 View  Result Date: 10/19/2015 CLINICAL DATA:  Pt c/o central chest pain radiating to left side onset x approx. 4 hours ago. Also c/o nausea and fever. H/o sickle cell and heart attack x 15 years ago. EXAM: CHEST  2 VIEW COMPARISON:  10/08/2015 FINDINGS: Cardiac silhouette is top-normal in size. No mediastinal or hilar masses or evidence of adenopathy. Lungs are clear.  No pleural effusion or pneumothorax. Right internal jugular central venous line catheter tip projects in the lower superior vena cava near the caval atrial junction. Bony thorax is unremarkable. IMPRESSION: No active cardiopulmonary disease. Electronically Signed   By: Amie Portlandavid  Ormond M.D.   On: 10/19/2015 13:56    Procedures Procedures (including critical care time)  Medications Ordered in ED Medications  promethazine (PHENERGAN) injection 12.5 mg (12.5 mg Intravenous Refused 10/19/15 1402)  dextrose 5 %-0.45 % sodium chloride infusion ( Intravenous New Bag/Given 10/19/15 1358)  iopamidol (ISOVUE-370) 76 % injection 100 mL (not administered)  HYDROmorphone (DILAUDID) injection 1 mg (1 mg Intravenous Given 10/19/15 1338)  diphenhydrAMINE (BENADRYL) injection 25 mg (25 mg Intravenous Given 10/19/15 1339)  HYDROmorphone (DILAUDID) injection 2 mg (2 mg Intravenous Given 10/19/15 1538)     Initial Impression / Assessment and Plan / ED Course  I have reviewed the triage vital signs and the nursing notes.  Pertinent labs & imaging results that were available during my care of the patient were reviewed by me and considered in my medical decision making (see chart for details).  Clinical Course   Typical sickle cell pain and back legs and arms. New Chest pain which is unlike her sickle cell exacerbations. EKG shows normal sinus rhythm.  EKG is reassuring. Chest  x-rays negative. Hemoglobin appears to be at baseline with adequate reticulocyte count. D-dimer elevated in setting of new chest pain will proceed with CT imaging to rule out pulmonary embolism.  Patient well appearing and afebrile in the ED. Doubt acute chest syndrome. Cultures were sent. The  patient refused any lab stick. She was agreeable to culture being drawn from her PICC line.  Pain medications given.  CTPE pending at time of sign out to Dr. Eudelia Bunch.   She may require admission for pain control.   Final Clinical Impressions(s) / ED Diagnoses   Final diagnoses:  None    New Prescriptions New Prescriptions   No medications on file     Glynn Octave, MD 10/19/15 1733

## 2015-10-19 NOTE — H&P (Signed)
Brittany CampbellMiranda Zavadil ZOX:[id number] DOB: 09/08/1985 DOA: 10/19/2015     PCP: Jeanann LewandowskyJEGEDE, OLUGBEMIGA, MD   Outpatient Specialists: none Patient coming from:  home Lives  With family    Chief Complaint: chest pain  HPI: Brittany Foster is a 30 y.o. female with medical history significant of sickle cell disease, opiate tolerant and frequent visits for sickle cell pain crisis with narcotic seeking behavior     Presented with pain everywhere including her chest legs back arms the past 4 hours since typical to her sickle cell crisis. Chest pain was somewhat different but after discussion with her sickle cell physician was determined that she had had similar presentations in the past. She endorses feeling nauseous but hadn't vomited subjective fevers and one fever up to 100. She has known PICC line denies any discharge reports taking good care of her dressings. Patient reports that pain was worse with deep breaths no neurological complaints no headaches no diarrhea   She denies cough,  no SOB, no urinary symptom.  Patient have been seen in Sickle cell clinic on 8/18 and received dilaudid PCA. Toradol as well as IV fluids Hb at baseline, no leucocytosis, UA was unremarkable. Her pain improve down to 6 out of 10 and she was discharged to home with follow-up Regarding pertinent Chronic problems: regarding hx of Sickle cell disease patient have not been compliant with her medications including hydroxyurea and folic acid During recent admission she required  central line insertion   IN ER: MAXIMUM TEMPERATURE 99 blood pressure 103/58 heart rate 78 WBC noted to be 15.3 up from 9.7 yesterday hemoglobin 8.7 up from 8.2 yesterday Retake count percent 12.1 down from yesterday's Sodium 139 creatinine 0.31 potassium 3.3 bili 1.9 Patient refused blood cultures  CT chest showed no evidence of pulmonary embolism no acute chest Case was discussed with sickle cell Dr. She was Ordered:  Meds ordered this encounter    Medications  . HYDROmorphone (DILAUDID) injection 1 mg  . diphenhydrAMINE (BENADRYL) injection 25 mg  . promethazine (PHENERGAN) injection 12.5 mg  . oxyCODONE-acetaminophen (PERCOCET) 10-325 MG tablet    Sig: Take 1 tablet by mouth every 4 (four) hours as needed for pain.  Marland Kitchen. dextrose 5 %-0.45 % sodium chloride infusion  . HYDROmorphone (DILAUDID) injection 2 mg  . iopamidol (ISOVUE-370) 76 % injection 100 mL  . HYDROmorphone (DILAUDID) injection 2 mg  . HYDROmorphone (DILAUDID) injection 1 mg     Hospitalist was called for admission for sickle cell crisis with chest pain  Review of Systems:    Pertinent positives include: fatigue chest pain, diffuse pain  Constitutional:  No weight loss, night sweats, Fevers, chills, weight loss  HEENT:  No headaches, Difficulty swallowing,Tooth/dental problems,Sore throat,  No sneezing, itching, ear ache, nasal congestion, post nasal drip,  Cardio-vascular:  No  Orthopnea, PND, anasarca, dizziness, palpitations.no Bilateral lower extremity swelling  GI:  No heartburn, indigestion, abdominal pain, nausea, vomiting, diarrhea, change in bowel habits, loss of appetite, melena, blood in stool, hematemesis Resp:  no shortness of breath at rest. No dyspnea on exertion, No excess mucus, no productive cough, No non-productive cough, No coughing up of blood.No change in color of mucus.No wheezing. Skin:  no rash or lesions. No jaundice GU:  no dysuria, change in color of urine, no urgency or frequency. No straining to urinate.  No flank pain.  Musculoskeletal:  No joint pain or no joint swelling. No decreased range of motion. No back pain.  Psych:  No change in  mood or affect. No depression or anxiety. No memory loss.  Neuro: no localizing neurological complaints, no tingling, no weakness, no double vision, no gait abnormality, no slurred speech, no confusion  As per HPI otherwise 10 point review of systems negative.   Past Medical  History: Past Medical History:  Diagnosis Date  . Anemia   . Depression, major, recurrent (HCC)   . Migraines   . Sickle cell anemia (HCC)    Past Surgical History:  Procedure Laterality Date  . CESAREAN SECTION    . CHOLECYSTECTOMY  2000  . IR GENERIC HISTORICAL  10/08/2015   IR US GUIDE VASC ACCESS RIGHT 10/08/2015 Simonne Come, MD [facility]  . IR GENERIC HISTORICAL  10/08/2015   IR FLUORO GUIDE CV LINE RIGHT 10/08/2015 Simonne Come, MD [facility]  . MULTIPLE TOOTH EXTRACTIONS N/A   . port a cath placement Right    about 6-7 years ago  . removal of porta cath Right 09/11/15  . TUBAL LIGATION       Social History:  Ambulatory   independently      reports that she has been smoking.  She has never used smokeless tobacco. She reports that she does not drink alcohol or use drugs.  Allergies:   Allergies  Allergen Reactions  . Ultram [Tramadol] Other (See Comments)    seizures  . Zofran [Ondansetron Hcl] Nausea And Vomiting  . Buprenorphine Hcl Hives and Rash    Shaking Tolerates Percocet, Norco, and buprenorphine  . Morphine And Related Hives, Rash and Other (See Comments)    Shaking Tolerates Percocet, Norco, Dilaudid, and buprenorphine  . Tape Rash       Family History:   Family History  Problem Relation Age of Onset  . Sickle cell trait Father   . Sickle cell trait Mother   . Sickle cell anemia Other     Medications: Prior to Admission medications   Medication Sig Start Date End Date Taking? Authorizing Provider  DULoxetine (CYMBALTA) 60 MG capsule Take 1 capsule (60 mg total) by mouth daily. 08/08/15  Yes [name], FNP  folic acid (FOLVITE) 1 MG tablet Take 1 tablet (1 mg total) by mouth daily. 08/08/15  Yes [name], FNP  hydroxyurea (HYDREA) 500 MG capsule TAKE 2 CAPSULES BY MOUTH DAILY. MAY TAKE WITH FOOD TO MINIMIZE GI SIDE EFFECTS. Patient taking differently: Take 500 mg by mouth daily. MAY TAKE WITH FOOD TO MINIMIZE GI SIDE EFFECTS. 08/08/15   Yes [name], FNP  ibuprofen (ADVIL,MOTRIN) 200 MG tablet Take 400 mg by mouth every 6 (six) hours as needed for moderate pain.    Yes Historical Provider, MD  morphine (MS CONTIN) 30 MG 12 hr tablet Take 1 tablet (30 mg total) by mouth every 12 (twelve) hours. 10/04/15  Yes Quentin Angst, MD  oxyCODONE-acetaminophen (PERCOCET) 10-325 MG tablet Take 1 tablet by mouth every 4 (four) hours as needed for pain.   Yes Historical Provider, MD  Topiramate ER 100 MG CP24 Take 100 mg by mouth at bedtime.   Yes Historical Provider, MD    Physical Exam: Patient Vitals for the past 24 hrs:  BP Temp Temp src Pulse Resp SpO2  10/19/15 1930 108/70 - - 92 11 98 %  10/19/15 1538 108/73 - - 94 16 100 %  10/19/15 1336 - 97.6 F (36.4 C) Rectal - - -  10/19/15 1232 112/72 99 F (37.2 C) Oral 100 18 100 %    1. General:  in No Acute distress 2. Psychological: Alert and   Oriented 3. Head/ENT:   Moist  Mucous Membranes                          Head Non traumatic, neck supple                          Normal   Dentition 4. SKIN:  decreased Skin turgor,  Skin clean Dry and intact no rash, line apears clean no evidence of redness or discharge 5. Heart: Regular rate and rhythm no  Murmur, Rub or gallop 6. Lungs:  Clear to auscultation bilaterally, no wheezes or crackles   7. Abdomen: Soft,  non-tender, Non distended 8. Lower extremities: no clubbing, cyanosis, or edema 9. Neurologically Grossly intact, moving all 4 extremities equally  10. MSK: Normal range of motion   body mass index is unknown because there is no height or weight on file.  Labs on Admission:   Labs on Admission: I have personally reviewed following labs and imaging studies  CBC:  Recent Labs Lab 10/13/15 0800 10/18/15 1055 10/19/15 1354  WBC 9.8 9.7 15.3*  NEUTROABS 5.2 6.2 11.5*  HGB 7.3* 8.2* 8.7*  HCT 20.7* 24.2* 24.7*  MCV 104.5* 102.5* 102.5*  PLT 301 364 394   Basic Metabolic Panel:  Recent Labs Lab  10/19/15 1354  NA 139  K 3.3*  CL 113*  CO2 20*  GLUCOSE 89  BUN 6  CREATININE 0.31*  CALCIUM 9.1   GFR: Estimated Creatinine Clearance: 80.4 mL/min (by C-G formula based on SCr of 0.8 mg/dL). Liver Function Tests:  Recent Labs Lab 10/19/15 1354  AST 35  ALT 24  ALKPHOS 69  BILITOT 1.9*  PROT 7.4  ALBUMIN 4.4   No results for input(s): LIPASE, AMYLASE in the last 168 hours. No results for input(s): AMMONIA in the last 168 hours. Coagulation Profile: No results for input(s): INR, PROTIME in the last 168 hours. Cardiac Enzymes:  Recent Labs Lab 10/19/15 1354  TROPONINI <0.03   BNP (last 3 results) No results for input(s): PROBNP in the last 8760 hours. HbA1C: No results for input(s): HGBA1C in the last 72 hours. CBG: No results for input(s): GLUCAP in the last 168 hours. Lipid Profile: No results for input(s): CHOL, HDL, LDLCALC, TRIG, CHOLHDL, LDLDIRECT in the last 72 hours. Thyroid Function Tests: No results for input(s): TSH, T4TOTAL, FREET4, T3FREE, THYROIDAB in the last 72 hours. Anemia Panel:  Recent Labs  10/18/15 1055 10/19/15 1354  RETICCTPCT 14.0* 12.1*   Urine analysis:    Component Value Date/Time   COLORURINE AMBER (A) 10/18/2015 1055   APPEARANCEUR CLOUDY (A) 10/18/2015 1055   LABSPEC 1.015 10/18/2015 1055   PHURINE 6.0 10/18/2015 1055   GLUCOSEU NEGATIVE 10/18/2015 1055   HGBUR NEGATIVE 10/18/2015 1055   BILIRUBINUR NEGATIVE 10/18/2015 1055   KETONESUR NEGATIVE 10/18/2015 1055   PROTEINUR NEGATIVE 10/18/2015 1055   UROBILINOGEN 1.0 11/13/2014 1338   NITRITE NEGATIVE 10/18/2015 1055   LEUKOCYTESUR SMALL (A) 10/18/2015 1055   Sepsis Labs: @LABRCNTIP (procalcitonin:4,lacticidven:4) )No results found for this or any previous visit (from the past 240 hour(s)).    UA      no evidence of UTI   yesterday No results found for: HGBA1C  Estimated Creatinine Clearance: 80.4 mL/min (by C-G formula based on SCr of 0.8 mg/dL).  BNP (last 3  results) No results for input(s): PROBNP in the last  8760 hours.   ECG REPORT  Independently reviewed Rate: 84  Rhythm: Sinus rhythm ST&T Change: No acute ischemic changes  QTC 452  There were no vitals filed for this visit.   Cultures: No results found for: SDES, SPECREQUEST, CULT, REPTSTATUS   Radiological Exams on Admission: Dg Chest 2 View  Result Date: 10/19/2015 CLINICAL DATA:  Pt c/o central chest pain radiating to left side onset x approx. 4 hours ago. Also c/o nausea and fever. H/o sickle cell and heart attack x 15 years ago. EXAM: CHEST  2 VIEW COMPARISON:  10/08/2015 FINDINGS: Cardiac silhouette is top-normal in size. No mediastinal or hilar masses or evidence of adenopathy. Lungs are clear.  No pleural effusion or pneumothorax. Right internal jugular central venous line catheter tip projects in the lower superior vena cava near the caval atrial junction. Bony thorax is unremarkable. IMPRESSION: No active cardiopulmonary disease. Electronically Signed   By: Amie Portland M.D.   On: 10/19/2015 13:56   Ct Angio Chest Pe W And/or Wo Contrast  Result Date: 10/19/2015 CLINICAL DATA:  Chest pain and shortness of breath. Fever. History sickle cell anemia. EXAM: CT ANGIOGRAPHY CHEST WITH CONTRAST TECHNIQUE: Multidetector CT imaging of the chest was performed using the standard protocol during bolus administration of intravenous contrast. Multiplanar CT image reconstructions and MIPs were obtained to evaluate the vascular anatomy. CONTRAST:  100 cc of Isovue 370 COMPARISON:  Chest radiograph of earlier today. Most recent CT 09/02/2015. FINDINGS: Cardiovascular: The quality of this exam for evaluation of pulmonary embolism is excellent. No evidence of pulmonary embolism. Normal aortic caliber without dissection. Heart size upper normal. Trace pericardial fluid is likely physiologic. Catheter fragment again identified within the left brachiocephalic vein. A right-sided PICC line terminates at  the low SVC. Mediastinum/Nodes: No mediastinal or hilar adenopathy. Minimal residual thymus in the anterior mediastinum. Lungs/Pleura: No pleural fluid.  Clear lungs. Upper Abdomen: Normal imaged portions of the liver, adrenal glands, kidneys. Hyperattenuation throughout the spleen is likely related to sickle cell anemia and prior infarcts. Musculoskeletal: Mildly increased density within the T7 and T9 vertebral bodies may relate to sickle cell anemia induced infarcts. This is similar. Review of the MIP images confirms the above findings. IMPRESSION: 1.  No evidence of pulmonary embolism. 2.  No acute process in the chest. 3. Catheter fragment in the left brachiocephalic vein, unchanged including back to 2015. Electronically Signed   By: Jeronimo Greaves M.D.   On: 10/19/2015 17:02    Chart has been reviewed    Assessment/Plan  30 y.o. female with medical history significant of sickle cell disease, opiate tolerant and frequent visits for sickle cell pain crisis with narcotic seeking behavior being admitted for possible sickle cell crisis associated chest pain  Present on Admission:  . Hb-SS disease with crisis (HCC) - - will admit per sickle cell protocol, control pain, hydrate with IVF D5 .45% Saline @ 100 mls/hour,  Weight based Dilaudid PCA for opioid tolerant patients.    Transfuse as needed if Hg drops significantly below baseline. No evidence of acute chest at this time   Sickle cell team to take over management in AM . Hypokalemia -  will replace, check magnesium level . Leukocytosis - NO EVIDENCE OF INFECTION have been elevated in the past in the setting of crisis, refused blood culture . Chest pain likely related to sickle cell crisiS but will monitor on tele for first 24h. . Chronic pain syndrome -continue mscontin  Other plan as per orders.  DVT prophylaxis:  Lovenox     Code Status:  FULL CODE as per patient   Family Communication:   Family not  at  Bedside    Disposition Plan:      To home once workup is complete and patient is stable   Consults called: Sickle cell Team to see in AM    Admission status:  inpatient     Level of care    tele         I have spent a total of 55 min on this admission    Brittany Foster 10/19/2015, 8:44 PM    [facility]  Pager [phone]   after 2 AM please page floor coverage PA If 7AM-7PM, please contact the day team taking care of the patient  [email or web address]  Password [email or web address]

## 2015-10-19 NOTE — ED Notes (Signed)
Pt refused blood cultures.  RN and EDP aware.

## 2015-10-20 LAB — TROPONIN I
Troponin I: <0.03 ng/mL (ref <0.03)
Troponin I: <0.03 ng/mL (ref <0.03)

## 2015-10-20 LAB — PHOSPHORUS: Phosphorus: 3.8 mg/dL (ref 2.5–4.6)

## 2015-10-20 LAB — MAGNESIUM: Magnesium: 1.7 mg/dL (ref 1.7–2.4)

## 2015-10-20 MED ORDER — KETOROLAC TROMETHAMINE 15 MG/ML IJ SOLN
30.0000 mg | Freq: Four times a day (QID) | INTRAMUSCULAR | Status: AC
  Administered 2015-10-20 – 2015-10-24 (×17): 30 mg via INTRAVENOUS
  Filled 2015-10-20 (×17): qty 2

## 2015-10-20 MED ORDER — HYDROMORPHONE HCL 1 MG/ML IJ SOLN
1.0000 mg | Freq: Once | INTRAMUSCULAR | Status: AC
  Administered 2015-10-20: 1 mg via INTRAVENOUS
  Filled 2015-10-20: qty 1

## 2015-10-20 NOTE — Progress Notes (Signed)
Patient ID: Brittany Foster, female   DOB: September 11, 1985, 30 y.o.   MRN: [id number] Subjective:  Brittany Foster is a 30 y.o. female with medical history significant of sickle cell disease, opiate tolerant and frequent visits for sickle cell pain crisis with narcotic seeking behavior. She Presented with pain everywhere including her chest, legs, back, and arms typical to her sickle cell crisis. Pt has had multiple visits to ED and day hospital for pain crisis, known to say "I have Chest Pain which is unusual with my sickle cell". For this, patient has had 7 CT Angio in the last 6 months at [facility] ED only, she has had about 5 CT Angio within the same period at [facility] ED and about 3 CT Ang[facility] at [facility].  She has no new complaint except for ongoing pain especially her arms and hip joints. She is tolerating PO intake. No fever. No chest pain. No SOB.  Objective:  Vital signs in last 24 hours:  Vitals:   10/20/15 0550 10/20/15 0734 10/20/15 1000 10/20/15 1200  BP: (!) 93/49     Pulse: 77     Resp: 17 16  15   Temp: 97.8 F (36.6 C)     TempSrc: Oral     SpO2: 99% 98%  98%  Weight:   56.5 kg (124 lb 9 oz)   Height:   5' (1.524 m)     Intake/Output from previous day:   Intake/Output Summary (Last 24 hours) at 10/20/15 1243 Last data filed at 10/20/15 0900  Gross per 24 hour  Intake              890 ml  Output             1200 ml  Net             -310 ml    Physical Exam: General: Alert, awake, oriented x3, in no acute distress.  HEENT: [place]/AT PEERL, EOMI Neck: Trachea midline,  no masses, no thyromegal,y no JVD, no carotid bruit OROPHARYNX:  Moist, No exudate/ erythema/lesions.  Heart: Regular rate and rhythm, without murmurs, rubs, gallops, PMI non-displaced, no heaves or thrills on palpation.  Lungs: Clear to auscultation, no wheezing or rhonchi noted. No increased vocal fremitus resonant to percussion  Abdomen: Soft, nontender, nondistended, positive bowel sounds, no masses no  hepatosplenomegaly noted..  Neuro: No focal neurological deficits noted cranial nerves II through XII grossly intact. DTRs 2+ bilaterally upper and lower extremities. Strength 5 out of 5 in bilateral upper and lower extremities. Musculoskeletal: No warm swelling or erythema around joints, no spinal tenderness noted. Psychiatric: Patient alert and oriented x3, good insight and cognition, good recent to remote recall. Lymph node survey: No cervical axillary or inguinal lymphadenopathy noted.  Lab Results:  Basic Metabolic Panel:    Component Value Date/Time   NA 139 10/19/2015 1354   K 3.3 (L) 10/19/2015 1354   CL 113 (H) 10/19/2015 1354   CO2 20 (L) 10/19/2015 1354   BUN 6 10/19/2015 1354   CREATININE 0.31 (L) 10/19/2015 1354   GLUCOSE 89 10/19/2015 1354   CALCIUM 9.1 10/19/2015 1354   CBC:    Component Value Date/Time   WBC 15.3 (H) 10/19/2015 1354   HGB 8.7 (L) 10/19/2015 1354   HCT 24.7 (L) 10/19/2015 1354   PLT 394 10/19/2015 1354   MCV 102.5 (H) 10/19/2015 1354   NEUTROABS 11.5 (H) 10/19/2015 1354   LYMPHSABS 2.4 10/19/2015 1354   MONOABS 1.5 (H) 10/19/2015 1354  EOSABS 0.0 10/19/2015 1354   BASOSABS 0.0 10/19/2015 1354    No results found for this or any previous visit (from the past 240 hour(s)).  Studies/Results: Dg Chest 2 View  Result Date: 10/19/2015 CLINICAL DATA:  Pt c/o central chest pain radiating to left side onset x approx. 4 hours ago. Also c/o nausea and fever. H/o sickle cell and heart attack x 15 years ago. EXAM: CHEST  2 VIEW COMPARISON:  10/08/2015 FINDINGS: Cardiac silhouette is top-normal in size. No mediastinal or hilar masses or evidence of adenopathy. Lungs are clear.  No pleural effusion or pneumothorax. Right internal jugular central venous line catheter tip projects in the lower superior vena cava near the caval atrial junction. Bony thorax is unremarkable. IMPRESSION: No active cardiopulmonary disease. Electronically Signed   By: Amie Portlandavid  Ormond  M.D.   On: 10/19/2015 13:56   Ct Angio Chest Pe W And/or Wo Contrast  Result Date: 10/19/2015 CLINICAL DATA:  Chest pain and shortness of breath. Fever. History sickle cell anemia. EXAM: CT ANGIOGRAPHY CHEST WITH CONTRAST TECHNIQUE: Multidetector CT imaging of the chest was performed using the standard protocol during bolus administration of intravenous contrast. Multiplanar CT image reconstructions and MIPs were obtained to evaluate the vascular anatomy. CONTRAST:  100 cc of Isovue 370 COMPARISON:  Chest radiograph of earlier today. Most recent CT 09/02/2015. FINDINGS: Cardiovascular: The quality of this exam for evaluation of pulmonary embolism is excellent. No evidence of pulmonary embolism. Normal aortic caliber without dissection. Heart size upper normal. Trace pericardial fluid is likely physiologic. Catheter fragment again identified within the left brachiocephalic vein. A right-sided PICC line terminates at the low SVC. Mediastinum/Nodes: No mediastinal or hilar adenopathy. Minimal residual thymus in the anterior mediastinum. Lungs/Pleura: No pleural fluid.  Clear lungs. Upper Abdomen: Normal imaged portions of the liver, adrenal glands, kidneys. Hyperattenuation throughout the spleen is likely related to sickle cell anemia and prior infarcts. Musculoskeletal: Mildly increased density within the T7 and T9 vertebral bodies may relate to sickle cell anemia induced infarcts. This is similar. Review of the MIP images confirms the above findings. IMPRESSION: 1.  No evidence of pulmonary embolism. 2.  No acute process in the chest. 3. Catheter fragment in the left brachiocephalic vein, unchanged including back to 2015. Electronically Signed   By: Jeronimo GreavesKyle  Talbot M.D.   On: 10/19/2015 17:02    Medications: Scheduled Meds: . DULoxetine  60 mg Oral Daily  . enoxaparin (LOVENOX) injection  40 mg Subcutaneous Q24H  . folic acid  1 mg Oral Daily  . HYDROmorphone   Intravenous Q4H  . hydroxyurea  500 mg Oral  Daily  . ketorolac  30 mg Intravenous Q6H  . morphine  30 mg Oral Q12H  . senna-docusate  1 tablet Oral BID  . topiramate  100 mg Oral QHS   Continuous Infusions: . dextrose 5 % and 0.45% NaCl 125 mL/hr at 10/20/15 1013   PRN Meds:.bisacodyl, diphenhydrAMINE **OR** diphenhydrAMINE (BENADRYL) IVPB(SICKLE CELL ONLY), hydrOXYzine, naloxone **AND** sodium chloride flush, polyethylene glycol, sodium chloride flush, sodium phosphate  Consultants:  None  Procedures:  None  Antibiotics:  None  Assessment/Plan: Active Problems:   Hb-SS disease with crisis (HCC)   Hypokalemia   Sickle cell crisis (HCC)   Chronic pain syndrome   Chest pain   Leukocytosis  1. Hb SS with crisis: Pt has had multiple visits to ED and day hospital for pain crisis, known to say "I have Chest Pain which is unusual with my sickle cell". For  this, patient has [facility], she has had about 5 CT Ang[facility] the same period [facility] and [facility]. She is currently on Dilaudid PCA, her pain is still at 8/10, mostly on her arms and hip joints. Will increase Toradol to 30 mg Q6H, We will continue Dilaudid PCA, IVF and long acting medications. 2. B/L hip pain: Pt's pain in bilateral hips is at the level where her gait is impaired due to pain. Continue current pain management and mobility..  3. Hypokalemia: Replaced orally. Labs in am. 4. Leukocytosis: Pt has a mild leukocytosis without any signs of infection. Blood culture pending. Likely related to crisis. Will continue to monitor. 5. Anemia of Chronic disease: Continue Hydrea at current dose. Hb at 8.7, baseline 6. Chronic pain: Continue MS Contin.  7. Mood Disorder: Pt takes Topamax and Cymbalta for mood disorder. Continue.   Code Status: Full Code Family Communication: N/A Disposition Plan: Not yet ready for discharge  Infiniti Hoefling  If 7PM-7AM, please contact  night-coverage.  10/20/2015, 12:43 PM  LOS: 1 day

## 2015-10-21 LAB — URINE CULTURE: Culture: <10000 — AB

## 2015-10-21 LAB — CBC WITH DIFFERENTIAL/PLATELET
Basophils Absolute: 0.1 10*3/uL (ref 0.0–0.1)
Basophils Relative: 1 %
EOS ABS: 0.6 10*3/uL (ref 0.0–0.7)
Eosinophils Relative: 6 %
HCT: 20.4 % — ABNORMAL LOW (ref 36.0–46.0)
HEMOGLOBIN: 7.2 g/dL — AB (ref 12.0–15.0)
LYMPHS ABS: 4.3 10*3/uL — AB (ref 0.7–4.0)
LYMPHS PCT: 40 %
MCH: 36.4 pg — AB (ref 26.0–34.0)
MCHC: 35.3 g/dL (ref 30.0–36.0)
MCV: 103 fL — AB (ref 78.0–100.0)
MONOS PCT: 9 %
Monocytes Absolute: 1 10*3/uL (ref 0.1–1.0)
NEUTROS PCT: 44 %
Neutro Abs: 4.9 10*3/uL (ref 1.7–7.7)
Platelets: 282 10*3/uL (ref 150–400)
RBC: 1.98 MIL/uL — AB (ref 3.87–5.11)
RDW: 16.5 % — ABNORMAL HIGH (ref 11.5–15.5)
WBC: 10.9 10*3/uL — AB (ref 4.0–10.5)

## 2015-10-21 LAB — BASIC METABOLIC PANEL
Anion gap: 3 — ABNORMAL LOW (ref 5–15)
CHLORIDE: 115 mmol/L — AB (ref 101–111)
CO2: 21 mmol/L — AB (ref 22–32)
CREATININE: 0.39 mg/dL — AB (ref 0.44–1.00)
Calcium: 8.2 mg/dL — ABNORMAL LOW (ref 8.9–10.3)
GFR calc Af Amer: >60 mL/min (ref >60)
GFR calc non Af Amer: >60 mL/min (ref >60)
GLUCOSE: 106 mg/dL — AB (ref 65–99)
POTASSIUM: 3.4 mmol/L — AB (ref 3.5–5.1)
Sodium: 139 mmol/L (ref 135–145)

## 2015-10-21 MED ORDER — HYDROMORPHONE 1 MG/ML IV SOLN
INTRAVENOUS | Status: DC
Start: 2015-10-22 — End: 2015-10-24
  Administered 2015-10-21: 3.5 mg via INTRAVENOUS
  Administered 2015-10-22: 7 mg via INTRAVENOUS
  Administered 2015-10-22: 08:00:00 via INTRAVENOUS
  Administered 2015-10-22: 9 mg via INTRAVENOUS
  Administered 2015-10-22: 2.5 mg via INTRAVENOUS
  Administered 2015-10-22: 10 mg via INTRAVENOUS
  Administered 2015-10-22: 3.5 mg via INTRAVENOUS
  Administered 2015-10-22: 6 mg via INTRAVENOUS
  Administered 2015-10-22: 19:00:00 via INTRAVENOUS
  Administered 2015-10-23: 5.1 mg via INTRAVENOUS
  Administered 2015-10-23: 6.5 mg via INTRAVENOUS
  Administered 2015-10-23: 8.19 mg via INTRAVENOUS
  Administered 2015-10-23: 12:00:00 via INTRAVENOUS
  Administered 2015-10-23: 3.5 mg via INTRAVENOUS
  Administered 2015-10-23: 8 mg via INTRAVENOUS
  Administered 2015-10-24: 01:00:00 via INTRAVENOUS
  Administered 2015-10-24: 6.5 mg via INTRAVENOUS
  Administered 2015-10-24: 4.5 mg via INTRAVENOUS
  Administered 2015-10-24: 8.5 mg via INTRAVENOUS
  Filled 2015-10-21 (×4): qty 25

## 2015-10-21 MED ORDER — ONDANSETRON HCL 4 MG/2ML IJ SOLN: 4.0000 mg | Freq: Four times a day (QID) | INTRAMUSCULAR | Status: DC | PRN

## 2015-10-21 MED ORDER — SODIUM CHLORIDE 0.9 % IV SOLN
12.5000 mg | INTRAVENOUS | Status: DC | PRN
  Filled 2015-10-21: qty 0.25

## 2015-10-21 MED ORDER — DIPHENHYDRAMINE HCL 50 MG/ML IJ SOLN: 25.0000 mg | INTRAMUSCULAR | Status: DC | PRN

## 2015-10-21 MED ORDER — NALOXONE HCL 0.4 MG/ML IJ SOLN: 0.4000 mg | INTRAMUSCULAR | Status: DC | PRN

## 2015-10-21 MED ORDER — DIPHENHYDRAMINE HCL 25 MG PO CAPS: 25.0000 mg | ORAL_CAPSULE | ORAL | Status: DC | PRN

## 2015-10-21 MED ORDER — HYDROMORPHONE 1 MG/ML IV SOLN
INTRAVENOUS | Status: DC
  Administered 2015-10-21: 6.5 mg via INTRAVENOUS

## 2015-10-21 MED ORDER — HYDROMORPHONE HCL 1 MG/ML IJ SOLN
1.0000 mg | INTRAMUSCULAR | Status: DC | PRN
  Administered 2015-10-22 – 2015-10-25 (×23): 1 mg via INTRAVENOUS
  Filled 2015-10-21 (×11): qty 1

## 2015-10-21 MED ORDER — POTASSIUM CHLORIDE CRYS ER 20 MEQ PO TBCR
40.0000 meq | EXTENDED_RELEASE_TABLET | Freq: Once | ORAL | Status: AC
  Administered 2015-10-21: 40 meq via ORAL
  Filled 2015-10-21: qty 2

## 2015-10-21 MED ORDER — SODIUM CHLORIDE 0.9 % IV SOLN
25.0000 mg | INTRAVENOUS | Status: DC | PRN
  Administered 2015-10-21 – 2015-10-26 (×19): 25 mg via INTRAVENOUS
  Filled 2015-10-21 (×41): qty 0.5

## 2015-10-21 MED ORDER — DIPHENHYDRAMINE HCL 25 MG PO CAPS
25.0000 mg | ORAL_CAPSULE | ORAL | Status: DC | PRN
  Administered 2015-10-26 (×2): 50 mg via ORAL
  Filled 2015-10-21 (×3): qty 2

## 2015-10-21 MED ORDER — SODIUM CHLORIDE 0.9% FLUSH: 9.0000 mL | INTRAVENOUS | Status: DC | PRN

## 2015-10-21 NOTE — Progress Notes (Signed)
Patient ID: Brittany Foster, female   DOB: 02/25/1986, 30 y.o.   MRN: [id number] Subjective: Patient claimed pain control is sub-optimal, did not get enough sleep because of pain and barely able to walk to the bathroom because of hip joint pain. She does not have fever or headache, denies chest pain, no SOB, tolerating PO intake, no N/V/D, no urinary symptom.  Objective:  Vital signs in last 24 hours:  Vitals:   10/21/15 0815 10/21/15 1257 10/21/15 1501 10/21/15 1703  BP:   (!) 82/46   Pulse:   85   Resp: 13 16 18 18   Temp:   98.6 F (37 C)   TempSrc:   Oral   SpO2: 100% 100% 98% 99%  Weight:      Height:       Intake/Output from previous day:   Intake/Output Summary (Last 24 hours) at 10/21/15 1732 Last data filed at 10/21/15 1502  Gross per 24 hour  Intake             4470 ml  Output             2000 ml  Net             2470 ml   Physical Exam: General: Alert, awake, oriented x3, in no acute distress.  HEENT: [place]/AT PEERL, EOMI Neck: Trachea midline,  no masses, no thyromegal,y no JVD, no carotid bruit OROPHARYNX:  Moist, No exudate/ erythema/lesions.  Heart: Regular rate and rhythm, without murmurs, rubs, gallops, PMI non-displaced, no heaves or thrills on palpation.  Lungs: Clear to auscultation, no wheezing or rhonchi noted. No increased vocal fremitus resonant to percussion  Abdomen: Soft, nontender, nondistended, positive bowel sounds, no masses no hepatosplenomegaly noted..  Neuro: No focal neurological deficits noted cranial nerves II through XII grossly intact. DTRs 2+ bilaterally upper and lower extremities. Strength 5 out of 5 in bilateral upper and lower extremities. Musculoskeletal: No warm swelling or erythema around joints, no spinal tenderness noted. Psychiatric: Patient alert and oriented x3, good insight and cognition, good recent to remote recall. Lymph node survey: No cervical axillary or inguinal lymphadenopathy noted.  Lab Results:  Basic Metabolic  Panel:    Component Value Date/Time   NA 139 10/21/2015 0345   K 3.4 (L) 10/21/2015 0345   CL 115 (H) 10/21/2015 0345   CO2 21 (L) 10/21/2015 0345   BUN <5 (L) 10/21/2015 0345   CREATININE 0.39 (L) 10/21/2015 0345   GLUCOSE 106 (H) 10/21/2015 0345   CALCIUM 8.2 (L) 10/21/2015 0345   CBC:    Component Value Date/Time   WBC 10.9 (H) 10/21/2015 0345   HGB 7.2 (L) 10/21/2015 0345   HCT 20.4 (L) 10/21/2015 0345   PLT 282 10/21/2015 0345   MCV 103.0 (H) 10/21/2015 0345   NEUTROABS 4.9 10/21/2015 0345   LYMPHSABS 4.3 (H) 10/21/2015 0345   MONOABS 1.0 10/21/2015 0345   EOSABS 0.6 10/21/2015 0345   BASOSABS 0.1 10/21/2015 0345    Recent Results (from the past 240 hour(s))  Urine culture     Status: Abnormal   Collection Time: 10/19/15  1:06 PM  Result Value Ref Range Status   Specimen Description URINE, RANDOM  Final   Special Requests NONE  Final   Culture (A)  Final    <10,000 COLONIES/mL INSIGNIFICANT GROWTH [facility]    Report Status 10/21/2015 FINAL  Final  Blood culture (routine x 2)     Status: None (Preliminary result)   Collection  Time: 10/19/15  2:05 PM  Result Value Ref Range Status   Specimen Description BLOOD PICC  5 ML IN [facility]EACH BOTTLE  Final   Special Requests NONE  Final   Culture   Final    NO GROWTH 2 DAYS [facility]    Report Status PENDING  Incomplete    Studies/Results: No results found.  Medications: Scheduled Meds: . DULoxetine  60 mg Oral Daily  . enoxaparin (LOVENOX) injection  40 mg Subcutaneous Q24H  . folic acid  1 mg Oral Daily  . HYDROmorphone   Intravenous Q4H  . hydroxyurea  500 mg Oral Daily  . ketorolac  30 mg Intravenous Q6H  . morphine  30 mg Oral Q12H  . senna-docusate  1 tablet Oral BID  . topiramate  100 mg Oral QHS   Continuous Infusions: . dextrose 5 % and 0.45% NaCl 125 mL/hr at 10/21/15 1001   PRN Meds:.bisacodyl, diphenhydrAMINE **OR** [DISCONTINUED] diphenhydrAMINE  (BENADRYL) IVPB(SICKLE CELL ONLY), HYDROmorphone (DILAUDID) injection, hydrOXYzine, naloxone **AND** sodium chloride flush, polyethylene glycol, sodium chloride flush, sodium phosphate  Consultants:  None  Procedures:  None  Antibiotics:  None  Assessment/Plan: Active Problems:   Hb-SS disease with crisis (HCC)   Hypokalemia   Sickle cell crisis (HCC)   Chronic pain syndrome   Chest pain   Leukocytosis 1. Hb SS with crisis: Pt is currently on Dilaudid PCA, she said her pain is still at 8/10, mostly on her arms and hip joints. She said she is not able to walk well due to pain. Will continue Toradol at 30 mg Q6H, We will increase Dilaudid PCA, IVF and long acting medications, add clinician assisted dilauded doses. 2. B/L hip pain: Pt's pain in bilateral hips is at the level where her gait is impaired due to pain. Continue current pain management and mobility. May need MRI outpatient to rule out AVN  3. Hypokalemia: Replaced orally.  4. Leukocytosis: Pt has a mild leukocytosis without any signs of infection. Blood culture negative x 2 days. Likely related to crisis. Will continue to monitor. 5. Anemia of Chronic disease: Continue Hydrea at current dose. Hb dropped to 7.2 from 8.7, will continue to monitor, limit phlebotomy 6. Chronic pain: Continue MS Contin.  7. Mood Disorder: Pt takes Topamax and Cymbalta for mood disorder. Continue.    Code Status: Full Code Family Communication: N/A Disposition Plan: Not yet ready for discharge  Brittany Foster  If 7PM-7AM, please contact night-coverage.  10/21/2015, 5:32 PM  LOS: 2 days

## 2015-10-21 NOTE — Progress Notes (Signed)
Key Points: Use following P&T approved IV to PO non-antibiotic change policy.  Description contains the criteria that are approved Note: Policy Excludes:  Esophagectomy patientsPHARMACIST - PHYSICIAN COMMUNICATION DR:    CONCERNING: IV to Oral Route Change Policy  RECOMMENDATION: This patient is receiving Benadryl by the intravenous route.  Based on criteria approved by the Pharmacy and Therapeutics Committee, the intravenous medication(s) is/are being converted to the equivalent oral dose form(s).   DESCRIPTION: These criteria include:  The patient is eating (either orally or via tube) and/or has been taking other orally administered medications for a least 24 hours  The patient has no evidence of active gastrointestinal bleeding or impaired GI absorption (gastrectomy, short bowel, patient on TNA or NPO).  If you have questions about this conversion, please contact the Pharmacy Department  []   [phone]( [phone] )  [facility] []   [phone]( [phone] )  [facility]  []   [phone]( [phone] )  [facility] [x]   [phone]( [phone] )  [facility]  Earl ManyLegge, Eilee Schader [place], [facility] 10/21/2015 1:01 PM

## 2015-10-22 NOTE — Care Management Note (Signed)
Case Management Note  Patient Details  Name: Richmond CampbellMiranda Miller MRN: [id number] Date of Birth: 01/30/1986  Subjective/Objective: 30 y/o f admitted w/SSC. From home.                  Action/Plan:d/c plan home.  Expected Discharge Date:                 Expected Discharge Plan:  Home/Self Care  In-House Referral:     Discharge planning Services  CM Consult  Post Acute Care Choice:    Choice offered to:     DME Arranged:    DME Agency:     HH Arranged:    HH Agency:     Status of Service:  In process, will continue to follow  If discussed at Long Length of Stay Meetings, dates discussed:    Additional Comments:  Lanier ClamMahabir, Domnique Vanegas, RN 10/22/2015, 12:19 PM

## 2015-10-22 NOTE — Progress Notes (Signed)
Patient ID: [name], female   DOB: 10/10/1985, 30 y.o.   MRN: [id number] Subjective:  Patient still having significant pain, 8/10, she has not been asking for her clinician assisted doses of dilaudid. She has no fever, no swelling or redness, tolerating PO intake, no N/V/D. No urinary symptom. Ambulating well although with hip pains.  Objective:  Vital signs in last 24 hours:  Vitals:   10/22/15 0610 10/22/15 0800 10/22/15 1020 10/22/15 1239  BP: (!) 105/57  (!) 93/50   Pulse: 84  76   Resp: 18 19 20 12   Temp: 98.2 F (36.8 C)  98.7 F (37.1 C)   TempSrc: Oral  Oral   SpO2: 97% 97% 98% 100%  Weight:      Height:        Intake/Output from previous day:   Intake/Output Summary (Last 24 hours) at 10/22/15 1256 Last data filed at 10/22/15 1000  Gross per 24 hour  Intake          3622.75 ml  Output             2300 ml  Net          1322.75 ml    Physical Exam: General: Alert, awake, oriented x3, in no acute distress.  HEENT: [place]/AT PEERL, EOMI Neck: Trachea midline,  no masses, no thyromegal,y no JVD, no carotid bruit OROPHARYNX:  Moist, No exudate/ erythema/lesions.  Heart: Regular rate and rhythm, without murmurs, rubs, gallops, PMI non-displaced, no heaves or thrills on palpation.  Lungs: Clear to auscultation, no wheezing or rhonchi noted. No increased vocal fremitus resonant to percussion  Abdomen: Soft, nontender, nondistended, positive bowel sounds, no masses no hepatosplenomegaly noted..  Neuro: No focal neurological deficits noted cranial nerves II through XII grossly intact. DTRs 2+ bilaterally upper and lower extremities. Strength 5 out of 5 in bilateral upper and lower extremities. Musculoskeletal: No warm swelling or erythema around joints, no spinal tenderness noted. Psychiatric: Patient alert and oriented x3, good insight and cognition, good recent to remote recall. Lymph node survey: No cervical axillary or inguinal lymphadenopathy noted.  Lab  Results:  Basic Metabolic Panel:    Component Value Date/Time   NA 139 10/21/2015 0345   K 3.4 (L) 10/21/2015 0345   CL 115 (H) 10/21/2015 0345   CO2 21 (L) 10/21/2015 0345   BUN <5 (L) 10/21/2015 0345   CREATININE 0.39 (L) 10/21/2015 0345   GLUCOSE 106 (H) 10/21/2015 0345   CALCIUM 8.2 (L) 10/21/2015 0345   CBC:    Component Value Date/Time   WBC 10.9 (H) 10/21/2015 0345   HGB 7.2 (L) 10/21/2015 0345   HCT 20.4 (L) 10/21/2015 0345   PLT 282 10/21/2015 0345   MCV 103.0 (H) 10/21/2015 0345   NEUTROABS 4.9 10/21/2015 0345   LYMPHSABS 4.3 (H) 10/21/2015 0345   MONOABS 1.0 10/21/2015 0345   EOSABS 0.6 10/21/2015 0345   BASOSABS 0.1 10/21/2015 0345    Recent Results (from the past 240 hour(s))  Urine culture     Status: Abnormal   Collection Time: 10/19/15  1:06 PM  Result Value Ref Range Status   Specimen Description URINE, RANDOM  Final   Special Requests NONE  Final   Culture (A)  Final    <10,000 COLONIES/mL INSIGNIFICANT GROWTH [facility] at [facility]    Report Status 10/21/2015 FINAL  Final  Blood culture (routine x 2)     Status: None (Preliminary result)   Collection Time: 10/19/15  2:05 PM  Result Value Ref Range Status   Specimen Description BLOOD PICC  5 [facility]  Final   Special Requests NONE  Final   Culture   Final    NO GROWTH 2 DAYS [facility] at [facility]    Report Status PENDING  Incomplete    Studies/Results: No results found.  Medications: Scheduled Meds: . DULoxetine  60 mg Oral Daily  . enoxaparin (LOVENOX) injection  40 mg Subcutaneous Q24H  . folic acid  1 mg Oral Daily  . HYDROmorphone   Intravenous Q4H  . hydroxyurea  500 mg Oral Daily  . ketorolac  30 mg Intravenous Q6H  . morphine  30 mg Oral Q12H  . senna-docusate  1 tablet Oral BID  . topiramate  100 mg Oral QHS   Continuous Infusions: . dextrose 5 % and 0.45% NaCl 125 mL/hr at 10/22/15 1052   PRN Meds:.bisacodyl, diphenhydrAMINE **OR**  diphenhydrAMINE (BENADRYL) IVPB(SICKLE CELL ONLY), HYDROmorphone (DILAUDID) injection, hydrOXYzine, naloxone **AND** sodium chloride flush, polyethylene glycol, sodium chloride flush, sodium phosphate  Consultants:  None  Procedures:  None  Antibiotics:  None  Assessment/Plan: Active Problems:   Hb-SS disease with crisis (HCC)   Hypokalemia   Sickle cell crisis (HCC)   Chronic pain syndrome   Chest pain   Leukocytosis  1. Hb SS with crisis: Pt is on Dilaudid PCA, she said her pain is still at 8/10, Will continue Toradol at 30 mg Q6H, Will Dilaudid PCA, IVF andlong acting medications, continue clinician assisted dilauded doses. 2. B/L hip pain: Continue current pain management and mobility. May need MRI outpatient to rule out AVN  3. Hypokalemia:Replaced orally. Check BMP in am 4. Leukocytosis:Pt has a mild leukocytosis without any signs of infection. Blood culture negative x 3 days. Likely related to crisis. Will continue to monitor. 5. Anemia of Chronic disease:Continue Hydrea at current dose. Hb dropped to 7.2 from 8.7, will continue to monitor, limit phlebotomy 6. Chronic pain: Continue MS Contin.  7. Mood Disorder: Pt takes Topamax and Cymbalta for mood disorder. Continue.    Code Status: Full Code Family Communication: N/A Disposition Plan: Not yet ready for discharge  Brittany Foster  If 7PM-7AM, please contact night-coverage.  10/22/2015, 12:56 PM  LOS: 3 days

## 2015-10-23 ENCOUNTER — Telehealth: Payer: Self-pay

## 2015-10-23 DIAGNOSIS — I950 Idiopathic hypotension: Secondary | ICD-10-CM

## 2015-10-23 LAB — CBC WITH DIFFERENTIAL/PLATELET
BASOS ABS: 0.1 10*3/uL (ref 0.0–0.1)
Basophils Relative: 1 %
EOS PCT: 3 %
Eosinophils Absolute: 0.3 10*3/uL (ref 0.0–0.7)
HEMATOCRIT: 18.9 % — AB (ref 36.0–46.0)
Hemoglobin: 6.7 g/dL — CL (ref 12.0–15.0)
LYMPHS ABS: 3.3 10*3/uL (ref 0.7–4.0)
LYMPHS PCT: 39 %
MCH: 36.2 pg — AB (ref 26.0–34.0)
MCHC: 35.4 g/dL (ref 30.0–36.0)
MCV: 102.2 fL — AB (ref 78.0–100.0)
MONO ABS: 0.9 10*3/uL (ref 0.1–1.0)
Monocytes Relative: 11 %
NEUTROS ABS: 3.9 10*3/uL (ref 1.7–7.7)
NEUTROS PCT: 47 %
PLATELETS: 272 10*3/uL (ref 150–400)
RBC: 1.85 MIL/uL — AB (ref 3.87–5.11)
RDW: 17.4 % — ABNORMAL HIGH (ref 11.5–15.5)
WBC: 8.4 10*3/uL (ref 4.0–10.5)

## 2015-10-23 LAB — BASIC METABOLIC PANEL
ANION GAP: 2 — AB (ref 5–15)
BUN: <5 mg/dL — ABNORMAL LOW (ref 6–20)
CHLORIDE: 115 mmol/L — AB (ref 101–111)
CO2: 20 mmol/L — ABNORMAL LOW (ref 22–32)
Calcium: 8.4 mg/dL — ABNORMAL LOW (ref 8.9–10.3)
Creatinine, Ser: 0.49 mg/dL (ref 0.44–1.00)
Glucose, Bld: 103 mg/dL — ABNORMAL HIGH (ref 65–99)
POTASSIUM: 3.7 mmol/L (ref 3.5–5.1)
SODIUM: 137 mmol/L (ref 135–145)

## 2015-10-23 LAB — HEMOGLOBIN AND HEMATOCRIT, BLOOD
HCT: 23.8 % — ABNORMAL LOW (ref 36.0–46.0)
Hemoglobin: 8.2 g/dL — ABNORMAL LOW (ref 12.0–15.0)

## 2015-10-23 LAB — PREPARE RBC (CROSSMATCH)

## 2015-10-23 LAB — ABO/RH: ABO/RH(D): O POS

## 2015-10-23 MED ORDER — PROMETHAZINE HCL 25 MG PO TABS: 25.0000 mg | ORAL_TABLET | Freq: Four times a day (QID) | ORAL | Status: DC | PRN

## 2015-10-23 MED ORDER — SODIUM CHLORIDE 0.9 % IV SOLN
Freq: Once | INTRAVENOUS | Status: AC
  Administered 2015-10-23: 11:00:00 via INTRAVENOUS

## 2015-10-23 NOTE — Progress Notes (Signed)
PROGRESS NOTE                                                                                                                                                                                                             Patient Demographics:    Brittany Foster, is a 30 y.o. female, DOB - [birth year], UQJ:[id number]  Admit date - 10/19/2015   Admitting Physician Toy Baker, MD  Outpatient Primary MD for the patient is Angelica Chessman, MD  LOS - 4  [facility] Sickle cell  Chief Complaint  Patient presents with  . Sickle Cell Pain Crisis       Brief Narrative   30 year old female with sickle cell disease admitted with sickle cell pain crisis.    Subjective:   Patient still having pain in her back partially controlled with Dilaudid PCA. Found to have low normal blood pressure and some drop in H&H.   Assessment  & Plan :    Active Problems:   Hb-SS disease with crisis ([place]) Continue current blood C8. Still reports 8/10 and continue scheduled Toradol 30 minute and every 6 hours and when necessary IV Dilaudid 1 mg every 3 hours. Continue home dose MS Contin (30 mg every 12 hours). Monitor for sedation respiratory depression while on Dilaudid PCA. Continue hydroxyurea and Cymbalta. Supportive care with antiemetics. Continue aggressive IV hydration.      Hypokalemia   Replenished.  Hypotension Monitor with IV hydration.  Sickle cell anemia Drop in H&H from a baseline of around 8. (Hemoglobin 6.7 today). Will hold off on transfusion   1.    Code Status : Full code  Family Communication  : None at bedside  Disposition Plan  : Home possibly tomorrow if able to wean off PCA  Barriers For Discharge : Active symptoms, PCA  Consults  : None  Procedures  : None  DVT Prophylaxis  :  Lovenox -   Lab Results  Component Value Date   PLT 272 10/23/2015    Antibiotics  :    Anti-infectives    None        Objective:   Vitals:   10/23/15 0800 10/23/15 0845 10/23/15 1159 10/23/15 1446  BP:    (!) 91/51  Pulse:    79  Resp: '17 17 13 15  '$ Temp:    98.4 F (36.9  C)  TempSrc:    Oral  SpO2: 98% 98% 96% 95%  Weight:      Height:        Wt Readings from Last 3 Encounters:  10/20/15 56.5 kg (124 lb 9 oz)  10/18/15 56.7 kg (125 lb)  10/08/15 56.7 kg (125 lb)     Intake/Output Summary (Last 24 hours) at 10/23/15 1607 Last data filed at 10/23/15 1447  Gross per 24 hour  Intake          3060.42 ml  Output                0 ml  Net          3060.42 ml     Physical Exam  Gen: not in distress HEENT:Pallor present, moist mucosa, supple neck Chest: clear b/l, no added sounds CVS: N S1&S2, no murmurs, rubs or gallop GI: soft, NT, ND, BS+ Musculoskeletal: warm, no edema CNS: Alert and oriented    Data Review:    CBC  Recent Labs Lab 10/18/15 1055 10/19/15 1354 10/21/15 0345 10/23/15 0935  WBC 9.7 15.3* 10.9* 8.4  HGB 8.2* 8.7* 7.2* 6.7*  HCT 24.2* 24.7* 20.4* 18.9*  PLT 364 394 282 272  MCV 102.5* 102.5* 103.0* 102.2*  MCH 34.7* 36.1* 36.4* 36.2*  MCHC 33.9 35.2 35.3 35.4  RDW 17.9* 17.0* 16.5* 17.4*  LYMPHSABS 2.4 2.4 4.3* 3.3  MONOABS 1.0 1.5* 1.0 0.9  EOSABS 0.1 0.0 0.6 0.3  BASOSABS 0.0 0.0 0.1 0.1    Chemistries   Recent Labs Lab 10/19/15 1354 10/20/15 0500 10/21/15 0345 10/23/15 0935  NA 139  --  139 137  K 3.3*  --  3.4* 3.7  CL 113*  --  115* 115*  CO2 20*  --  21* 20*  GLUCOSE 89  --  106* 103*  BUN 6  --  <5* <5*  CREATININE 0.31*  --  0.39* 0.49  CALCIUM 9.1  --  8.2* 8.4*  MG  --  1.7  --   --   AST 35  --   --   --   ALT 24  --   --   --   ALKPHOS 69  --   --   --   BILITOT 1.9*  --   --   --    ------------------------------------------------------------------------------------------------------------------ No results for input(s): CHOL, HDL, LDLCALC, TRIG, CHOLHDL, LDLDIRECT in the last 72  hours.  No results found for: HGBA1C ------------------------------------------------------------------------------------------------------------------ No results for input(s): TSH, T4TOTAL, T3FREE, THYROIDAB in the last 72 hours.  Invalid input(s): FREET3 ------------------------------------------------------------------------------------------------------------------ No results for input(s): VITAMINB12, FOLATE, FERRITIN, TIBC, IRON, RETICCTPCT in the last 72 hours.  Coagulation profile No results for input(s): INR, PROTIME in the last 168 hours.  No results for input(s): DDIMER in the last 72 hours.  Cardiac Enzymes  Recent Labs Lab 10/20/15 0000 10/20/15 0541 10/20/15 1140  TROPONINI <0.03 <0.03 <0.03   ------------------------------------------------------------------------------------------------------------------    Component Value Date/Time   BNP 114.5 (H) 04/29/2014 1459    Inpatient Medications  Scheduled Meds: . sodium chloride   Intravenous Once  . DULoxetine  60 mg Oral Daily  . enoxaparin (LOVENOX) injection  40 mg Subcutaneous Q24H  . folic acid  1 mg Oral Daily  . HYDROmorphone   Intravenous Q4H  . hydroxyurea  500 mg Oral Daily  . ketorolac  30 mg Intravenous Q6H  . morphine  30 mg Oral Q12H  . senna-docusate  1 tablet Oral BID  .  topiramate  100 mg Oral QHS   Continuous Infusions: . dextrose 5 % and 0.45% NaCl 125 mL/hr at 10/23/15 1223   PRN Meds:.bisacodyl, diphenhydrAMINE **OR** diphenhydrAMINE (BENADRYL) IVPB(SICKLE CELL ONLY), HYDROmorphone (DILAUDID) injection, hydrOXYzine, naloxone **AND** sodium chloride flush, polyethylene glycol, sodium chloride flush, sodium phosphate  Micro Results Recent Results (from the past 240 hour(s))  Urine culture     Status: Abnormal   Collection Time: 10/19/15  1:06 PM  Result Value Ref Range Status   Specimen Description URINE, RANDOM  Final   Special Requests NONE  Final   Culture (A)  Final    <10,000  COLONIES/mL INSIGNIFICANT GROWTH Performed at [facility]    Report Status 10/21/2015 FINAL  Final  Blood culture (routine x 2)     Status: None (Preliminary result)   Collection Time: 10/19/15  2:05 PM  Result Value Ref Range Status   Specimen Description BLOOD PICC  5 ML IN [facility] BOTTLE  Final   Special Requests NONE  Final   Culture   Final    NO GROWTH 4 DAYS Performed at [facility]    Report Status PENDING  Incomplete    Radiology Reports Dg Chest 2 View  Result Date: 10/19/2015 CLINICAL DATA:  Pt c/o central chest pain radiating to left side onset x approx. 4 hours ago. Also c/o nausea and fever. H/o sickle cell and heart attack x 15 years ago. EXAM: CHEST  2 VIEW COMPARISON:  10/08/2015 FINDINGS: Cardiac silhouette is top-normal in size. No mediastinal or hilar masses or evidence of adenopathy. Lungs are clear.  No pleural effusion or pneumothorax. Right internal jugular central venous line catheter tip projects in the lower superior vena cava near the caval atrial junction. Bony thorax is unremarkable. IMPRESSION: No active cardiopulmonary disease. Electronically Signed   By: Lajean Manes M.D.   On: 10/19/2015 13:56   Dg Chest 2 View  Result Date: 10/08/2015 CLINICAL DATA:  Sickle cell disease and complains of chest pain. Mild shortness of breath. EXAM: CHEST  2 VIEW COMPARISON:  09/02/2015 FINDINGS: Right subclavian Port-A-Cath has been removed and the patient now has a right jugular central line with the tip near the superior cavoatrial junction. There is a chronic catheter fragment in the left innominate vein that is poorly characterized on this examination. Both lungs are clear without pulmonary edema or airspace disease. Heart and mediastinum are within normal limits. The trachea is midline. No pleural effusions. No acute bone abnormality. IMPRESSION: No active cardiopulmonary disease. Central line tip at the superior cavoatrial junction. Electronically Signed    By: Markus Daft M.D.   On: 10/08/2015 16:53   Ct Angio Chest Pe W And/or Wo Contrast  Result Date: 10/19/2015 CLINICAL DATA:  Chest pain and shortness of breath. Fever. History sickle cell anemia. EXAM: CT ANGIOGRAPHY CHEST WITH CONTRAST TECHNIQUE: Multidetector CT imaging of the chest was performed using the standard protocol during bolus administration of intravenous contrast. Multiplanar CT image reconstructions and MIPs were obtained to evaluate the vascular anatomy. CONTRAST:  100 cc of Isovue 370 COMPARISON:  Chest radiograph of earlier today. Most recent CT 09/02/2015. FINDINGS: Cardiovascular: The quality of this exam for evaluation of pulmonary embolism is excellent. No evidence of pulmonary embolism. Normal aortic caliber without dissection. Heart size upper normal. Trace pericardial fluid is likely physiologic. Catheter fragment again identified within the left brachiocephalic vein. A right-sided PICC line terminates at the low SVC. Mediastinum/Nodes: No mediastinal or hilar adenopathy. Minimal residual thymus in  the anterior mediastinum. Lungs/Pleura: No pleural fluid.  Clear lungs. Upper Abdomen: Normal imaged portions of the liver, adrenal glands, kidneys. Hyperattenuation throughout the spleen is likely related to sickle cell anemia and prior infarcts. Musculoskeletal: Mildly increased density within the T7 and T9 vertebral bodies may relate to sickle cell anemia induced infarcts. This is similar. Review of the MIP images confirms the above findings. IMPRESSION: 1.  No evidence of pulmonary embolism. 2.  No acute process in the chest. 3. Catheter fragment in the left brachiocephalic vein, unchanged including back to 2015. Electronically Signed   By: Abigail Miyamoto M.D.   On: 10/19/2015 17:02   Ir Fluoro Guide Cv Line Right  Result Date: 10/08/2015 INDICATION: History of sickle cell disease, in need of durable intravenous access for frequent blood draws and medication administration. Note, patient  has history of prior Port a catheter placement however recently had Port a catheter removed secondary to bacteremia. As such, request made made for placement of a tunneled central venous catheter in lieu of placement of a formal Port a Catheter. EXAM: TUNNELED PICC LINE WITH ULTRASOUND AND FLUOROSCOPIC GUIDANCE MEDICATIONS: None ANESTHESIA/SEDATION: None COMPLICATIONS: None immediate. PROCEDURE: Informed written consent was obtained from the patient after a discussion of the risks, benefits, and alternatives to treatment. Questions regarding the procedure were encouraged and answered. The right neck and chest were prepped with chlorhexidine in a sterile fashion, and a sterile drape was applied covering the operative field. Maximum barrier sterile technique with sterile gowns and gloves were used for the procedure. A timeout was performed prior to the initiation of the procedure. After creating a small venotomy incision, a micropuncture kit was utilized to access the right internal jugular vein under direct, real-time ultrasound guidance after the overlying soft tissues were anesthetized with 1% lidocaine with epinephrine. Ultrasound image documentation was performed. The microwire was kinked to measure appropriate catheter length. The micropuncture sheath was exchanged for a peel-away sheath over a guidewire. A 5 French single lumen tunneled PICC measuring 20 cm was tunneled in a retrograde fashion from the anterior chest wall to the venotomy incision. The catheter was then placed through the peel-away sheath with tip ultimately positioned at the superior caval-atrial junction. Final catheter positioning was confirmed and documented with a spot radiographic image. The catheter aspirates and flushes normally. The catheter was flushed with appropriate volume heparin dwells. The catheter exit site was secured with a 0-Prolene retention suture. The venotomy incision was closed with an interrupted 4-0 Vicryl, Dermabond  and Steri-strips. Dressings were applied. The patient tolerated the procedure well without immediate post procedural complication. FINDINGS: After catheter placement, the tip lies within the superior cavoatrial junction. The catheter aspirates and flushes normally and is ready for immediate use. Re- demonstrated abandoned catheter tubing overlying the expected location of the central aspect of the left innominate vein. IMPRESSION: Successful placement of 20 cm single lumen tunneled PICC catheter via the right internal jugular vein with tip terminating at the superior caval atrial junction. The catheter is ready for immediate use. Electronically Signed   By: Sandi Mariscal M.D.   On: 10/08/2015 17:04   Ir US Guide Vasc Access Right  Result Date: 10/08/2015 INDICATION: History of sickle cell disease, in need of durable intravenous access for frequent blood draws and medication administration. Note, patient has history of prior Port a catheter placement however recently had Port a catheter removed secondary to bacteremia. As such, request made made for placement of a tunneled central venous catheter in  lieu of placement of a formal Port a Catheter. EXAM: TUNNELED PICC LINE WITH ULTRASOUND AND FLUOROSCOPIC GUIDANCE MEDICATIONS: None ANESTHESIA/SEDATION: None COMPLICATIONS: None immediate. PROCEDURE: Informed written consent was obtained from the patient after a discussion of the risks, benefits, and alternatives to treatment. Questions regarding the procedure were encouraged and answered. The right neck and chest were prepped with chlorhexidine in a sterile fashion, and a sterile drape was applied covering the operative field. Maximum barrier sterile technique with sterile gowns and gloves were used for the procedure. A timeout was performed prior to the initiation of the procedure. After creating a small venotomy incision, a micropuncture kit was utilized to access the right internal jugular vein under direct,  real-time ultrasound guidance after the overlying soft tissues were anesthetized with 1% lidocaine with epinephrine. Ultrasound image documentation was performed. The microwire was kinked to measure appropriate catheter length. The micropuncture sheath was exchanged for a peel-away sheath over a guidewire. A 5 French single lumen tunneled PICC measuring 20 cm was tunneled in a retrograde fashion from the anterior chest wall to the venotomy incision. The catheter was then placed through the peel-away sheath with tip ultimately positioned at the superior caval-atrial junction. Final catheter positioning was confirmed and documented with a spot radiographic image. The catheter aspirates and flushes normally. The catheter was flushed with appropriate volume heparin dwells. The catheter exit site was secured with a 0-Prolene retention suture. The venotomy incision was closed with an interrupted 4-0 Vicryl, Dermabond and Steri-strips. Dressings were applied. The patient tolerated the procedure well without immediate post procedural complication. FINDINGS: After catheter placement, the tip lies within the superior cavoatrial junction. The catheter aspirates and flushes normally and is ready for immediate use. Re- demonstrated abandoned catheter tubing overlying the expected location of the central aspect of the left innominate vein. IMPRESSION: Successful placement of 20 cm single lumen tunneled PICC catheter via the right internal jugular vein with tip terminating at the superior caval atrial junction. The catheter is ready for immediate use. Electronically Signed   By: Sandi Mariscal M.D.   On: 10/08/2015 17:04    Time Spent in minutes  25   Louellen Molder M.D on 10/23/2015 at 4:07 PM  Between 7am to 7pm - Pager - [phone]  After 7pm go to [email or web address] - password [facility]  [facility] -  Office  [phone]

## 2015-10-23 NOTE — Progress Notes (Signed)
Pt blood pressure 87/44.  Asymptomatic.  HR in the 70's.  MD made aware.  WIll continue to monitor closely.

## 2015-10-24 LAB — TYPE AND SCREEN
ABO/RH(D): O POS
Antibody Screen: NEGATIVE
DONOR AG TYPE: NEGATIVE
UNIT DIVISION: 0
Unit division: 0

## 2015-10-24 LAB — CBC
HEMATOCRIT: 23.4 % — AB (ref 36.0–46.0)
HEMOGLOBIN: 8.4 g/dL — AB (ref 12.0–15.0)
MCH: 35.3 pg — AB (ref 26.0–34.0)
MCHC: 35.9 g/dL (ref 30.0–36.0)
MCV: 98.3 fL (ref 78.0–100.0)
Platelets: 292 10*3/uL (ref 150–400)
RBC: 2.38 MIL/uL — AB (ref 3.87–5.11)
RDW: 19.2 % — ABNORMAL HIGH (ref 11.5–15.5)
WBC: 9.4 10*3/uL (ref 4.0–10.5)

## 2015-10-24 LAB — BLOOD CULTURE ID PANEL (REFLEXED)
ACINETOBACTER BAUMANNII: NOT DETECTED
CANDIDA ALBICANS: NOT DETECTED
CANDIDA KRUSEI: NOT DETECTED
CANDIDA PARAPSILOSIS: NOT DETECTED
Candida glabrata: NOT DETECTED
Candida tropicalis: NOT DETECTED
ENTEROBACTERIACEAE SPECIES: NOT DETECTED
ENTEROCOCCUS SPECIES: NOT DETECTED
ESCHERICHIA COLI: NOT DETECTED
Enterobacter cloacae complex: NOT DETECTED
Haemophilus influenzae: NOT DETECTED
KLEBSIELLA OXYTOCA: NOT DETECTED
Klebsiella pneumoniae: NOT DETECTED
LISTERIA MONOCYTOGENES: NOT DETECTED
Neisseria meningitidis: NOT DETECTED
Proteus species: NOT DETECTED
Pseudomonas aeruginosa: NOT DETECTED
SERRATIA MARCESCENS: NOT DETECTED
STREPTOCOCCUS PYOGENES: NOT DETECTED
Staphylococcus aureus (BCID): NOT DETECTED
Staphylococcus species: NOT DETECTED
Streptococcus agalactiae: NOT DETECTED
Streptococcus pneumoniae: NOT DETECTED
Streptococcus species: NOT DETECTED

## 2015-10-24 MED ORDER — SODIUM CHLORIDE 0.9 % IV SOLN
INTRAVENOUS | Status: AC
  Administered 2015-10-24 – 2015-10-25 (×2): via INTRAVENOUS

## 2015-10-24 MED ORDER — HYDROMORPHONE 1 MG/ML IV SOLN
INTRAVENOUS | Status: DC
  Administered 2015-10-24: 15.59 mg via INTRAVENOUS
  Administered 2015-10-24: 8.6 mg via INTRAVENOUS
  Administered 2015-10-24: 6.8 mg via INTRAVENOUS
  Administered 2015-10-25: 5.2 mg via INTRAVENOUS
  Administered 2015-10-25: 12 mg via INTRAVENOUS
  Administered 2015-10-25: 10:00:00 via INTRAVENOUS
  Administered 2015-10-25: 12.6 mg via INTRAVENOUS
  Administered 2015-10-25: 19:00:00 via INTRAVENOUS
  Administered 2015-10-25: 4.6 mg via INTRAVENOUS
  Administered 2015-10-25: 3.6 mg via INTRAVENOUS
  Administered 2015-10-25: 12.4 mg via INTRAVENOUS
  Administered 2015-10-25: 7.2 mg via INTRAVENOUS
  Administered 2015-10-26: 6.6 mg via INTRAVENOUS
  Administered 2015-10-26: 19:00:00 via INTRAVENOUS
  Administered 2015-10-26: 10.2 mg via INTRAVENOUS
  Administered 2015-10-26: 9.6 mg via INTRAVENOUS
  Administered 2015-10-26: 12 mg via INTRAVENOUS
  Administered 2015-10-26 (×2): 5.4 mg via INTRAVENOUS
  Administered 2015-10-27: 11.4 mg via INTRAVENOUS
  Administered 2015-10-27: 10.4 mg via INTRAVENOUS
  Administered 2015-10-27 (×2): 10.8 mg via INTRAVENOUS
  Filled 2015-10-24 (×8): qty 25

## 2015-10-24 NOTE — Telephone Encounter (Signed)
Refill request for oxycodone. Please advise. Thanks!  

## 2015-10-24 NOTE — Progress Notes (Signed)
PROGRESS NOTE                                                                                                                                                                                                             Patient Demographics:    Brittany Foster, is a 30 y.o. female, DOB - 12/01/1985, WPY:[id number]  Admit date - 10/19/2015   Admitting Physician Toy Baker, MD  Outpatient Primary MD for the patient is Angelica Chessman, MD  LOS - 5  [facility] Sickle cell  Chief Complaint  Patient presents with  . Sickle Cell Pain Crisis       Brief Narrative   30 year old female with sickle cell disease admitted with sickle cell pain crisis.    Subjective:   Patient still having pain in her back partially controlled with Dilaudid PCA. Found to have low normal blood pressure and some drop in H&H.   Assessment  & Plan :    Active Problems:   Hb-SS disease with crisis ([place]) Still having significant pain on current PCA dosing (dose increased to 0.6 mg bolus then 1 hour does limit of 3.6 mg). Continue Toradol 30 minute and every 6 hours and when necessary IV Dilaudid 1 mg every 3 hours. Continue home dose MS Contin (30 mg every 12 hours). Monitor for sedation respiratory depression while on PCA. Continue hydroxyurea and Cymbalta. Supportive care with antiemetics. Continue IV hydration.      Hypokalemia   Replenished.  Hypotension Continue aggressive IV hydration.  Sickle cell anemia H&H  at baseline.       Code Status : Full code  Family Communication  : None at bedside  Disposition Plan  : Home possibly tomorrow if able to wean off PCA  Barriers For Discharge : Active symptoms, PCA  Consults  : None  Procedures  : None  DVT Prophylaxis  :  Lovenox -   Lab Results  Component Value Date   PLT 292 10/24/2015    Antibiotics  :   Anti-infectives    None        Objective:   Vitals:   10/24/15 0539 10/24/15 0800 10/24/15 1156 10/24/15 1321  BP: (!) 95/44   (!) 91/48  Pulse: 97   84  Resp: '16 15 10 16  '$ Temp: 98.7 F (37.1 C)   98.4 F (  36.9 C)  TempSrc: Oral   Oral  SpO2: 100% 98% 98% 97%  Weight:      Height:        Wt Readings from Last 3 Encounters:  10/20/15 56.5 kg (124 lb 9 oz)  10/18/15 56.7 kg (125 lb)  10/08/15 56.7 kg (125 lb)     Intake/Output Summary (Last 24 hours) at 10/24/15 1346 Last data filed at 10/24/15 1035  Gross per 24 hour  Intake          4254.58 ml  Output                0 ml  Net          4254.58 ml     Physical Exam  Gen: In some distress with pain HEENT:Pallor present, moist mucosa,  Chest: clear b/l, no added sounds CVS: N S1&S2, no murmurs GI: soft, NT, ND, BS+ Musculoskeletal: warm, no edema, tenderness in the back and legs CNS: Alert and oriented    Data Review:    CBC  Recent Labs Lab 10/18/15 1055 10/19/15 1354 10/21/15 0345 10/23/15 0935 10/23/15 2055 10/24/15 0445  WBC 9.7 15.3* 10.9* 8.4  --  9.4  HGB 8.2* 8.7* 7.2* 6.7* 8.2* 8.4*  HCT 24.2* 24.7* 20.4* 18.9* 23.8* 23.4*  PLT 364 394 282 272  --  292  MCV 102.5* 102.5* 103.0* 102.2*  --  98.3  MCH 34.7* 36.1* 36.4* 36.2*  --  35.3*  MCHC 33.9 35.2 35.3 35.4  --  35.9  RDW 17.9* 17.0* 16.5* 17.4*  --  19.2*  LYMPHSABS 2.4 2.4 4.3* 3.3  --   --   MONOABS 1.0 1.5* 1.0 0.9  --   --   EOSABS 0.1 0.0 0.6 0.3  --   --   BASOSABS 0.0 0.0 0.1 0.1  --   --     Chemistries   Recent Labs Lab 10/19/15 1354 10/20/15 0500 10/21/15 0345 10/23/15 0935  NA 139  --  139 137  K 3.3*  --  3.4* 3.7  CL 113*  --  115* 115*  CO2 20*  --  21* 20*  GLUCOSE 89  --  106* 103*  BUN 6  --  <5* <5*  CREATININE 0.31*  --  0.39* 0.49  CALCIUM 9.1  --  8.2* 8.4*  MG  --  1.7  --   --   AST 35  --   --   --   ALT 24  --   --   --   ALKPHOS 69  --   --   --   BILITOT 1.9*  --   --   --     ------------------------------------------------------------------------------------------------------------------ No results for input(s): CHOL, HDL, LDLCALC, TRIG, CHOLHDL, LDLDIRECT in the last 72 hours.  No results found for: HGBA1C ------------------------------------------------------------------------------------------------------------------ No results for input(s): TSH, T4TOTAL, T3FREE, THYROIDAB in the last 72 hours.  Invalid input(s): FREET3 ------------------------------------------------------------------------------------------------------------------ No results for input(s): VITAMINB12, FOLATE, FERRITIN, TIBC, IRON, RETICCTPCT in the last 72 hours.  Coagulation profile No results for input(s): INR, PROTIME in the last 168 hours.  No results for input(s): DDIMER in the last 72 hours.  Cardiac Enzymes  Recent Labs Lab 10/20/15 0000 10/20/15 0541 10/20/15 1140  TROPONINI <0.03 <0.03 <0.03   ------------------------------------------------------------------------------------------------------------------    Component Value Date/Time   BNP 114.5 (H) 04/29/2014 1459    Inpatient Medications  Scheduled Meds: . DULoxetine  60 mg Oral Daily  . enoxaparin (LOVENOX) injection  40 mg Subcutaneous Q24H  . folic acid  1 mg Oral Daily  . HYDROmorphone   Intravenous Q4H  . hydroxyurea  500 mg Oral Daily  . ketorolac  30 mg Intravenous Q6H  . morphine  30 mg Oral Q12H  . senna-docusate  1 tablet Oral BID  . topiramate  100 mg Oral QHS   Continuous Infusions: . dextrose 5 % and 0.45% NaCl 125 mL/hr at 10/24/15 0640   PRN Meds:.bisacodyl, diphenhydrAMINE **OR** diphenhydrAMINE (BENADRYL) IVPB(SICKLE CELL ONLY), HYDROmorphone (DILAUDID) injection, hydrOXYzine, naloxone **AND** sodium chloride flush, polyethylene glycol, promethazine, sodium chloride flush, sodium phosphate  Micro Results Recent Results (from the past 240 hour(s))  Urine culture     Status: Abnormal    Collection Time: 10/19/15  1:06 PM  Result Value Ref Range Status   Specimen Description URINE, RANDOM  Final   Special Requests NONE  Final   Culture (A)  Final    <10,000 COLONIES/mL INSIGNIFICANT GROWTH Performed at [facility]    Report Status 10/21/2015 FINAL  Final  Blood culture (routine x 2)     Status: None (Preliminary result)   Collection Time: 10/19/15  2:05 PM  Result Value Ref Range Status   Specimen Description BLOOD PICC  5 ML IN [facility] BOTTLE  Final   Special Requests NONE  Final   Culture  Setup Time   Final    GRAM POSITIVE RODS ANAEROBIC BOTTLE ONLY Organism ID to follow CRITICAL RESULT CALLED TO, READ BACK BY AND VERIFIED WITH: D. Wofford Pharm.D. 13:30 10/24/15  ([email or web address])    Culture   Final    TOO YOUNG TO READ Performed at [facility]    Report Status PENDING  Incomplete  Blood Culture ID Panel (Reflexed)     Status: None   Collection Time: 10/19/15  2:05 PM  Result Value Ref Range Status   Enterococcus species NOT DETECTED NOT DETECTED Final   Listeria monocytogenes NOT DETECTED NOT DETECTED Final   Staphylococcus species NOT DETECTED NOT DETECTED Final   Staphylococcus aureus NOT DETECTED NOT DETECTED Final   Streptococcus species NOT DETECTED NOT DETECTED Final   Streptococcus agalactiae NOT DETECTED NOT DETECTED Final   Streptococcus pneumoniae NOT DETECTED NOT DETECTED Final   Streptococcus pyogenes NOT DETECTED NOT DETECTED Final   Acinetobacter baumannii NOT DETECTED NOT DETECTED Final   Enterobacteriaceae species NOT DETECTED NOT DETECTED Final   Enterobacter cloacae complex NOT DETECTED NOT DETECTED Final   Escherichia coli NOT DETECTED NOT DETECTED Final   Klebsiella oxytoca NOT DETECTED NOT DETECTED Final   Klebsiella pneumoniae NOT DETECTED NOT DETECTED Final   Proteus species NOT DETECTED NOT DETECTED Final   Serratia marcescens NOT DETECTED NOT DETECTED Final   Haemophilus influenzae NOT DETECTED NOT DETECTED Final    Neisseria meningitidis NOT DETECTED NOT DETECTED Final   Pseudomonas aeruginosa NOT DETECTED NOT DETECTED Final   Candida albicans NOT DETECTED NOT DETECTED Final   Candida glabrata NOT DETECTED NOT DETECTED Final   Candida krusei NOT DETECTED NOT DETECTED Final   Candida parapsilosis NOT DETECTED NOT DETECTED Final   Candida tropicalis NOT DETECTED NOT DETECTED Final    Radiology Reports Dg Chest 2 View  Result Date: 10/19/2015 CLINICAL DATA:  Pt c/o central chest pain radiating to left side onset x approx. 4 hours ago. Also c/o nausea and fever. H/o sickle cell and heart attack x 15 years ago. EXAM: CHEST  2 VIEW COMPARISON:  10/08/2015 FINDINGS: Cardiac silhouette is top-normal in size.  No mediastinal or hilar masses or evidence of adenopathy. Lungs are clear.  No pleural effusion or pneumothorax. Right internal jugular central venous line catheter tip projects in the lower superior vena cava near the caval atrial junction. Bony thorax is unremarkable. IMPRESSION: No active cardiopulmonary disease. Electronically Signed   By: Amie Portland M.D.   On: 10/19/2015 13:56   Dg Chest 2 View  Result Date: 10/08/2015 CLINICAL DATA:  Sickle cell disease and complains of chest pain. Mild shortness of breath. EXAM: CHEST  2 VIEW COMPARISON:  09/02/2015 FINDINGS: Right subclavian Port-A-Cath has been removed and the patient now has a right jugular central line with the tip near the superior cavoatrial junction. There is a chronic catheter fragment in the left innominate vein that is poorly characterized on this examination. Both lungs are clear without pulmonary edema or airspace disease. Heart and mediastinum are within normal limits. The trachea is midline. No pleural effusions. No acute bone abnormality. IMPRESSION: No active cardiopulmonary disease. Central line tip at the superior cavoatrial junction. Electronically Signed   By: Richarda Overlie M.D.   On: 10/08/2015 16:53   Ct Angio Chest Pe W And/or Wo  Contrast  Result Date: 10/19/2015 CLINICAL DATA:  Chest pain and shortness of breath. Fever. History sickle cell anemia. EXAM: CT ANGIOGRAPHY CHEST WITH CONTRAST TECHNIQUE: Multidetector CT imaging of the chest was performed using the standard protocol during bolus administration of intravenous contrast. Multiplanar CT image reconstructions and MIPs were obtained to evaluate the vascular anatomy. CONTRAST:  100 cc of Isovue 370 COMPARISON:  Chest radiograph of earlier today. Most recent CT 09/02/2015. FINDINGS: Cardiovascular: The quality of this exam for evaluation of pulmonary embolism is excellent. No evidence of pulmonary embolism. Normal aortic caliber without dissection. Heart size upper normal. Trace pericardial fluid is likely physiologic. Catheter fragment again identified within the left brachiocephalic vein. A right-sided PICC line terminates at the low SVC. Mediastinum/Nodes: No mediastinal or hilar adenopathy. Minimal residual thymus in the anterior mediastinum. Lungs/Pleura: No pleural fluid.  Clear lungs. Upper Abdomen: Normal imaged portions of the liver, adrenal glands, kidneys. Hyperattenuation throughout the spleen is likely related to sickle cell anemia and prior infarcts. Musculoskeletal: Mildly increased density within the T7 and T9 vertebral bodies may relate to sickle cell anemia induced infarcts. This is similar. Review of the MIP images confirms the above findings. IMPRESSION: 1.  No evidence of pulmonary embolism. 2.  No acute process in the chest. 3. Catheter fragment in the left brachiocephalic vein, unchanged including back to 2015. Electronically Signed   By: Jeronimo Greaves M.D.   On: 10/19/2015 17:02   Ir Fluoro Guide Cv Line Right  Result Date: 10/08/2015 INDICATION: History of sickle cell disease, in need of durable intravenous access for frequent blood draws and medication administration. Note, patient has history of prior Port a catheter placement however recently had Port a  catheter removed secondary to bacteremia. As such, request made made for placement of a tunneled central venous catheter in lieu of placement of a formal Port a Catheter. EXAM: TUNNELED PICC LINE WITH ULTRASOUND AND FLUOROSCOPIC GUIDANCE MEDICATIONS: None ANESTHESIA/SEDATION: None COMPLICATIONS: None immediate. PROCEDURE: Informed written consent was obtained from the patient after a discussion of the risks, benefits, and alternatives to treatment. Questions regarding the procedure were encouraged and answered. The right neck and chest were prepped with chlorhexidine in a sterile fashion, and a sterile drape was applied covering the operative field. Maximum barrier sterile technique with sterile gowns and gloves were  used for the procedure. A timeout was performed prior to the initiation of the procedure. After creating a small venotomy incision, a micropuncture kit was utilized to access the right internal jugular vein under direct, real-time ultrasound guidance after the overlying soft tissues were anesthetized with 1% lidocaine with epinephrine. Ultrasound image documentation was performed. The microwire was kinked to measure appropriate catheter length. The micropuncture sheath was exchanged for a peel-away sheath over a guidewire. A 5 French single lumen tunneled PICC measuring 20 cm was tunneled in a retrograde fashion from the anterior chest wall to the venotomy incision. The catheter was then placed through the peel-away sheath with tip ultimately positioned at the superior caval-atrial junction. Final catheter positioning was confirmed and documented with a spot radiographic image. The catheter aspirates and flushes normally. The catheter was flushed with appropriate volume heparin dwells. The catheter exit site was secured with a 0-Prolene retention suture. The venotomy incision was closed with an interrupted 4-0 Vicryl, Dermabond and Steri-strips. Dressings were applied. The patient tolerated the  procedure well without immediate post procedural complication. FINDINGS: After catheter placement, the tip lies within the superior cavoatrial junction. The catheter aspirates and flushes normally and is ready for immediate use. Re- demonstrated abandoned catheter tubing overlying the expected location of the central aspect of the left innominate vein. IMPRESSION: Successful placement of 20 cm single lumen tunneled PICC catheter via the right internal jugular vein with tip terminating at the superior caval atrial junction. The catheter is ready for immediate use. Electronically Signed   By: Simonne Come M.D.   On: 10/08/2015 17:04   Ir US Guide Vasc Access Right  Result Date: 10/08/2015 INDICATION: History of sickle cell disease, in need of durable intravenous access for frequent blood draws and medication administration. Note, patient has history of prior Port a catheter placement however recently had Port a catheter removed secondary to bacteremia. As such, request made made for placement of a tunneled central venous catheter in lieu of placement of a formal Port a Catheter. EXAM: TUNNELED PICC LINE WITH ULTRASOUND AND FLUOROSCOPIC GUIDANCE MEDICATIONS: None ANESTHESIA/SEDATION: None COMPLICATIONS: None immediate. PROCEDURE: Informed written consent was obtained from the patient after a discussion of the risks, benefits, and alternatives to treatment. Questions regarding the procedure were encouraged and answered. The right neck and chest were prepped with chlorhexidine in a sterile fashion, and a sterile drape was applied covering the operative field. Maximum barrier sterile technique with sterile gowns and gloves were used for the procedure. A timeout was performed prior to the initiation of the procedure. After creating a small venotomy incision, a micropuncture kit was utilized to access the right internal jugular vein under direct, real-time ultrasound guidance after the overlying soft tissues were  anesthetized with 1% lidocaine with epinephrine. Ultrasound image documentation was performed. The microwire was kinked to measure appropriate catheter length. The micropuncture sheath was exchanged for a peel-away sheath over a guidewire. A 5 French single lumen tunneled PICC measuring 20 cm was tunneled in a retrograde fashion from the anterior chest wall to the venotomy incision. The catheter was then placed through the peel-away sheath with tip ultimately positioned at the superior caval-atrial junction. Final catheter positioning was confirmed and documented with a spot radiographic image. The catheter aspirates and flushes normally. The catheter was flushed with appropriate volume heparin dwells. The catheter exit site was secured with a 0-Prolene retention suture. The venotomy incision was closed with an interrupted 4-0 Vicryl, Dermabond and Steri-strips. Dressings were applied. The  patient tolerated the procedure well without immediate post procedural complication. FINDINGS: After catheter placement, the tip lies within the superior cavoatrial junction. The catheter aspirates and flushes normally and is ready for immediate use. Re- demonstrated abandoned catheter tubing overlying the expected location of the central aspect of the left innominate vein. IMPRESSION: Successful placement of 20 cm single lumen tunneled PICC catheter via the right internal jugular vein with tip terminating at the superior caval atrial junction. The catheter is ready for immediate use. Electronically Signed   By: Sandi Mariscal M.D.   On: 10/08/2015 17:04    Time Spent in minutes  25   Louellen Molder M.D on 10/24/2015 at 1:46 PM  Between 7am to 7pm - Pager - [phone]  After 7pm go to [email or web address] - password [facility]  [facility] -  Office  [phone]

## 2015-10-24 NOTE — Progress Notes (Signed)
PHARMACY - PHYSICIAN COMMUNICATION CRITICAL VALUE ALERT - BLOOD CULTURE IDENTIFICATION (BCID)  Results for orders placed or performed during the hospital encounter of 10/19/15  Blood Culture ID Panel (Reflexed) (Collected: 10/19/2015  2:05 PM)  Result Value Ref Range   Enterococcus species NOT DETECTED NOT DETECTED   Listeria monocytogenes NOT DETECTED NOT DETECTED   Staphylococcus species NOT DETECTED NOT DETECTED   Staphylococcus aureus NOT DETECTED NOT DETECTED   Streptococcus species NOT DETECTED NOT DETECTED   Streptococcus agalactiae NOT DETECTED NOT DETECTED   Streptococcus pneumoniae NOT DETECTED NOT DETECTED   Streptococcus pyogenes NOT DETECTED NOT DETECTED   Acinetobacter baumannii NOT DETECTED NOT DETECTED   Enterobacteriaceae species NOT DETECTED NOT DETECTED   Enterobacter cloacae complex NOT DETECTED NOT DETECTED   Escherichia coli NOT DETECTED NOT DETECTED   Klebsiella oxytoca NOT DETECTED NOT DETECTED   Klebsiella pneumoniae NOT DETECTED NOT DETECTED   Proteus species NOT DETECTED NOT DETECTED   Serratia marcescens NOT DETECTED NOT DETECTED   Haemophilus influenzae NOT DETECTED NOT DETECTED   Neisseria meningitidis NOT DETECTED NOT DETECTED   Pseudomonas aeruginosa NOT DETECTED NOT DETECTED   Candida albicans NOT DETECTED NOT DETECTED   Candida glabrata NOT DETECTED NOT DETECTED   Candida krusei NOT DETECTED NOT DETECTED   Candida parapsilosis NOT DETECTED NOT DETECTED   Candida tropicalis NOT DETECTED NOT DETECTED    Name of physician (or Provider) Contacted: Dhung[place]  Not on antibiotics; results felt clinically insignificant and no additional antibiotic treatment required  Joandry Slagter A 10/24/2015  1:36 PM

## 2015-10-25 ENCOUNTER — Other Ambulatory Visit: Payer: Self-pay | Admitting: Family Medicine

## 2015-10-25 DIAGNOSIS — D571 Sickle-cell disease without crisis: Secondary | ICD-10-CM

## 2015-10-25 MED ORDER — OXYCODONE-ACETAMINOPHEN 10-325 MG PO TABS: 1.0000 | ORAL_TABLET | ORAL | Status: DC

## 2015-10-25 MED ORDER — MORPHINE SULFATE ER 30 MG PO TBCR: 30.0000 mg | EXTENDED_RELEASE_TABLET | Freq: Two times a day (BID) | ORAL | 0 refills | Status: DC

## 2015-10-25 MED ORDER — OXYCODONE-ACETAMINOPHEN 10-325 MG PO TABS: 1.0000 | ORAL_TABLET | ORAL | 0 refills | Status: DC | PRN

## 2015-10-25 MED ORDER — OXYCODONE HCL 5 MG PO TABS
5.0000 mg | ORAL_TABLET | ORAL | Status: DC
Start: 2015-10-25 — End: 2015-10-27
  Administered 2015-10-25 – 2015-10-27 (×12): 5 mg via ORAL
  Filled 2015-10-25 (×13): qty 1

## 2015-10-25 MED ORDER — HYDROMORPHONE HCL 1 MG/ML IJ SOLN
1.0000 mg | Freq: Once | INTRAMUSCULAR | Status: AC
  Administered 2015-10-26: 1 mg via INTRAVENOUS
  Filled 2015-10-25: qty 1

## 2015-10-25 MED ORDER — OXYCODONE-ACETAMINOPHEN 5-325 MG PO TABS
1.0000 | ORAL_TABLET | ORAL | Status: DC
  Administered 2015-10-25 – 2015-10-27 (×12): 1 via ORAL
  Filled 2015-10-25 (×13): qty 1

## 2015-10-25 NOTE — Progress Notes (Signed)
Subjective: A 30 year old female was chronic pain and sickle cell disease who is currently  Day 6  Into her admission was sickle cell painful crisis. Patient claims that her pain is 10 out of 10. She's always had pain reported at that high level. No shortness of breath no cough. No nausea vomiting or diarrhea. Patient's vitals are stable.  Objective: Vital signs in last 24 hours: Temp:  [98.1 F (36.7 C)-98.6 F (37 C)] 98.6 F (37 C) (08/25 1348) Pulse Rate:  [82-98] 89 (08/25 1348) Resp:  [15-19] 18 (08/25 1533) BP: (88-102)/(49-64) 88/49 (08/25 1348) SpO2:  [96 %-98 %] 96 % (08/25 1533) Weight change:  Last BM Date: 10/24/15  Intake/Output from previous day: 08/24 0701 - 08/25 0700 In: 2253.3 [P.O.:120; I.V.:1883.3; IV Piggyback:250] Out: -  Intake/Output this shift: Total I/O In: 270 [P.O.:120; IV Piggyback:150] Out: -   General appearance: alert and appears stated age Head: Normocephalic, without obvious abnormality, atraumatic Resp: clear to auscultation bilaterally Chest wall: no tenderness Cardio: regular rate and rhythm, S1, S2 normal, no murmur, click, rub or gallop GI: soft, non-tender; bowel sounds normal; no masses,  no organomegaly Extremities: extremities normal, atraumatic, no cyanosis or edema Pulses: 2+ and symmetric Skin: Skin color, texture, turgor normal. No rashes or lesions Neurologic: Grossly normal  Lab Results:  Recent Labs  10/23/15 0935 10/23/15 2055 10/24/15 0445  WBC 8.4  --  9.4  HGB 6.7* 8.2* 8.4*  HCT 18.9* 23.8* 23.4*  PLT 272  --  292   BMET  Recent Labs  10/23/15 0935  NA 137  K 3.7  CL 115*  CO2 20*  GLUCOSE 103*  BUN <5*  CREATININE 0.49  CALCIUM 8.4*    Studies/Results: No results found.  Medications: I have reviewed the patient's current medications.  Assessment/Plan: A 30 year old female well known to our service with sickle cell painful crisis.  #1 sickle cell painful crisis:  patient has chronic pain she  is currently on physician assisted dose and with PCA as well as long-acting medication. I will discontinue the physician assistant dose and I'll start her home Percocet. I've described to patient that she needs to walk and started to have ready for discharge.  #2 sickle cell anemia: Patient's hemoglobin is 8.4 yesterday. She apparently received blood transfusion. She appears to be stable at this point.  #3 hypokalemia: Potassium has been repleted.  #4 chronic hypotension: Patient getting aggressive hydration. She is known to have low blood pressure anyway. We'll continue monitoringand if she is asymptomatic would be  Discharged.  LOS: 6 days   GARBA,LAWAL 10/25/2015, 5:19 PM

## 2015-10-26 LAB — CULTURE, BLOOD (ROUTINE X 2)

## 2015-10-26 MED ORDER — HYDROMORPHONE HCL 2 MG/ML IJ SOLN
2.0000 mg | Freq: Once | INTRAMUSCULAR | Status: AC
Start: 2015-10-26 — End: 2015-10-26
  Administered 2015-10-26: 2 mg via INTRAVENOUS
  Filled 2015-10-26: qty 1

## 2015-10-26 NOTE — Progress Notes (Signed)
Subjective: Patient continues to complain of 10 out of 10 pain. sshe is on Dilaudid PCAand has used 50.4 mg with 86 demand and 84 deliveries. She is refusing to use her oral Percocet. She is laying down in bed complaining of ongoing pain. Explained to her that her pain is mostly chronic in nature. Patient continues to be hypotensive but appears to be asymptomatic.  Objective: Vital signs in last 24 hours: Temp:  [98 F (36.7 C)-98.8 F (37.1 C)] 98 F (36.7 C) (08/26 1438) Pulse Rate:  [89-94] 94 (08/26 1438) Resp:  [12-20] 19 (08/26 1628) BP: (81-104)/(52-73) 94/60 (08/26 1438) SpO2:  [96 %-99 %] 98 % (08/26 1628) Weight change:  Last BM Date: 10/24/15  Intake/Output from previous day: 08/25 0701 - 08/26 0700 In: 431.4 [P.O.:120; I.V.:11.4; IV Piggyback:300] Out: -  Intake/Output this shift: Total I/O In: 65.4 [P.O.:60; I.V.:5.4] Out: -   General appearance: alert and appears stated age Head: Normocephalic, without obvious abnormality, atraumatic Resp: clear to auscultation bilaterally Chest wall: no tenderness Cardio: regular rate and rhythm, S1, S2 normal, no murmur, click, rub or gallop GI: soft, non-tender; bowel sounds normal; no masses,  no organomegaly Extremities: extremities normal, atraumatic, no cyanosis or edema Pulses: 2+ and symmetric Skin: Skin color, texture, turgor normal. No rashes or lesions Neurologic: Grossly normal  Lab Results:  Recent Labs  10/23/15 2055 10/24/15 0445  WBC  --  9.4  HGB 8.2* 8.4*  HCT 23.8* 23.4*  PLT  --  292   BMET No results for input(s): NA, K, CL, CO2, GLUCOSE, BUN, CREATININE, CALCIUM in the last 72 hours.  Studies/Results: No results found.  Medications: I have reviewed the patient's current medications.  Assessment/Plan: A 30 year old female well known to our service with sickle cell painful crisis.  #1 sickle cell painful crisis:  patient would be mobilized today.  I would not give her any more physician  assistant medication. I'll transition her to have full oral medications with the PCA overnight. Despite her complaint of ongoing pain this appears to be chronic. As long as patient is able to walk I will discharge her home.  #2 sickle cell anemia: Patient's hemoglobin is 8.4 yesterday. She apparently received blood transfusion. She appears to be stable at this point.  #3 hypokalemia: Potassium has been repleted.  #4 chronic hypotension: Patient getting aggressive hydration. She is known to have low blood pressure anyway. We'll continue monitoringand if she is asymptomatic would be  Discharged.   LOS: 7 days   Nyasiah Moffet,LAWAL 10/26/2015, 6:12 PM

## 2015-10-27 LAB — COMPREHENSIVE METABOLIC PANEL
ALBUMIN: 3.8 g/dL (ref 3.5–5.0)
ALK PHOS: 70 U/L (ref 38–126)
ALT: 25 U/L (ref 14–54)
AST: 46 U/L — AB (ref 15–41)
Anion gap: 5 (ref 5–15)
BILIRUBIN TOTAL: 1.5 mg/dL — AB (ref 0.3–1.2)
BUN: 6 mg/dL (ref 6–20)
CALCIUM: 8.7 mg/dL — AB (ref 8.9–10.3)
CO2: 20 mmol/L — AB (ref 22–32)
Chloride: 114 mmol/L — ABNORMAL HIGH (ref 101–111)
Creatinine, Ser: 0.48 mg/dL (ref 0.44–1.00)
GFR calc Af Amer: >60 mL/min (ref >60)
GFR calc non Af Amer: >60 mL/min (ref >60)
GLUCOSE: 137 mg/dL — AB (ref 65–99)
Potassium: 3.5 mmol/L (ref 3.5–5.1)
Sodium: 139 mmol/L (ref 135–145)
TOTAL PROTEIN: 7 g/dL (ref 6.5–8.1)

## 2015-10-27 LAB — CBC WITH DIFFERENTIAL/PLATELET
BASOS ABS: 0.1 10*3/uL (ref 0.0–0.1)
BASOS PCT: 1 %
Eosinophils Absolute: 0.3 10*3/uL (ref 0.0–0.7)
Eosinophils Relative: 4 %
HEMATOCRIT: 23.5 % — AB (ref 36.0–46.0)
HEMOGLOBIN: 8.3 g/dL — AB (ref 12.0–15.0)
Lymphocytes Relative: 42 %
Lymphs Abs: 3.9 10*3/uL (ref 0.7–4.0)
MCH: 35.2 pg — ABNORMAL HIGH (ref 26.0–34.0)
MCHC: 35.3 g/dL (ref 30.0–36.0)
MCV: 99.6 fL (ref 78.0–100.0)
MONOS PCT: 10 %
Monocytes Absolute: 0.9 10*3/uL (ref 0.1–1.0)
NEUTROS ABS: 3.9 10*3/uL (ref 1.7–7.7)
NEUTROS PCT: 43 %
Platelets: 249 10*3/uL (ref 150–400)
RBC: 2.36 MIL/uL — AB (ref 3.87–5.11)
RDW: 18.2 % — ABNORMAL HIGH (ref 11.5–15.5)
WBC: 9 10*3/uL (ref 4.0–10.5)

## 2015-10-27 MED ORDER — HYDROMORPHONE HCL 2 MG/ML IJ SOLN
2.0000 mg | Freq: Once | INTRAMUSCULAR | Status: AC
  Administered 2015-10-27: 2 mg via INTRAVENOUS
  Filled 2015-10-27: qty 1

## 2015-10-27 MED ORDER — HEPARIN SOD (PORK) LOCK FLUSH 100 UNIT/ML IV SOLN
250.0000 [IU] | INTRAVENOUS | Status: AC | PRN
Start: 2015-10-27 — End: 2015-10-27
  Administered 2015-10-27: 250 [IU]

## 2015-10-27 NOTE — Discharge Summary (Signed)
Physician Discharge Summary  Patient ID: Brittany CampbellMiranda Foster MRN: [id number] DOB/AGE: 31/03/1985 31 y.o.  Admit date: 10/19/2015 Discharge date: 10/27/2015  Admission Diagnoses:  Discharge Diagnoses:  Active Problems:   Hb-SS disease with crisis (HCC)   Hypokalemia   Sickle cell crisis (HCC)   Chronic pain syndrome   Chest pain   Leukocytosis   Discharged Condition: good  Hospital Course: patient admitted with sickle cell painful crisis. She has chronic pain syndrome also. She has had multiple admissions with sickle cell crisis. She was admitted and started on IV Dilaudid PCA with IV Dilaudid when necessary doses. She has chronic hypotension also. Patient always have pain at 8 out of 10 irrespective of what medications given. She was finally discharged home on her home regimen. She has done much better and feels much better. She still complains of pain at the time of discharge.  Consults: None  Significant Diagnostic Studies: labs: serial CBCs and CMPs checked.  Treatments: IV hydration and analgesia: Dilaudid  Discharge Exam: Blood pressure (!) 82/51, pulse 87, temperature 98.4 F (36.9 C), temperature source Oral, resp. rate 15, height 5' (1.524 m), weight 56.5 kg (124 lb 9 oz), SpO2 100 %. General appearance: alert, cooperative and no distress Head: Normocephalic, without obvious abnormality, atraumatic Back: symmetric, no curvature. ROM normal. No CVA tenderness. Resp: clear to auscultation bilaterally Chest wall: no tenderness Cardio: regular rate and rhythm, S1, S2 normal, no murmur, click, rub or gallop GI: soft, non-tender; bowel sounds normal; no masses,  no organomegaly Extremities: extremities normal, atraumatic, no cyanosis or edema Pulses: 2+ and symmetric Skin: Skin color, texture, turgor normal. No rashes or lesions Neurologic: Grossly normal  Disposition: 01-Home or Self Care     Medication List    TAKE these medications   DULoxetine 60 MG capsule Commonly  known as:  CYMBALTA Take 1 capsule (60 mg total) by mouth daily.   folic acid 1 MG tablet Commonly known as:  FOLVITE Take 1 tablet (1 mg total) by mouth daily.   hydroxyurea 500 MG capsule Commonly known as:  HYDREA TAKE 2 CAPSULES BY MOUTH DAILY. MAY TAKE WITH FOOD TO MINIMIZE GI SIDE EFFECTS. What changed:  how much to take  how to take this  when to take this  additional instructions   ibuprofen 200 MG tablet Commonly known as:  ADVIL,MOTRIN Take 400 mg by mouth every 6 (six) hours as needed for moderate pain.   morphine 30 MG 12 hr tablet Commonly known as:  MS CONTIN Take 1 tablet (30 mg total) by mouth every 12 (twelve) hours.   oxyCODONE-acetaminophen 10-325 MG tablet Commonly known as:  PERCOCET Take 1 tablet by mouth every 4 (four) hours as needed for pain.   Topiramate ER 100 MG Cp24 Take 100 mg by mouth at bedtime.        SignedLonia Blood: [name],LAWAL 10/27/2015, 11:17 AM Time spent 35 minutes

## 2015-10-29 ENCOUNTER — Telehealth: Payer: Self-pay

## 2015-10-29 ENCOUNTER — Other Ambulatory Visit: Payer: Self-pay | Admitting: Internal Medicine

## 2015-10-30 ENCOUNTER — Telehealth: Payer: Self-pay | Admitting: Internal Medicine

## 2015-10-30 NOTE — Telephone Encounter (Signed)
Pt called the office to speak with nurse regarding the medication that was given to her. Pt stated that PCP has prescribed oxyCODONE-acetaminophen (PERCOCET) 10-325 MG tablet in the past and has given her 90 tablets and not 30 tablets. I explained to pt that I didn't see this (on Epic). Pt wants PCP to prescribe the 60 remaining that she needs. Please follow up.

## 2015-11-01 ENCOUNTER — Encounter (HOSPITAL_COMMUNITY): Payer: Self-pay | Admitting: *Deleted

## 2015-11-01 ENCOUNTER — Non-Acute Institutional Stay (HOSPITAL_COMMUNITY)
Admission: AD | Admit: 2015-11-01 | Discharge: 2015-11-01 | Disposition: A | Discharge status: Home / Self Care | Payer: Medicaid Other | Source: Ambulatory Visit | Attending: Internal Medicine | Admitting: Internal Medicine

## 2015-11-01 ENCOUNTER — Other Ambulatory Visit: Payer: Self-pay | Admitting: Internal Medicine

## 2015-11-01 ENCOUNTER — Telehealth (HOSPITAL_COMMUNITY): Payer: Self-pay | Admitting: *Deleted

## 2015-11-01 DIAGNOSIS — D5700 Hb-SS disease with crisis, unspecified: Secondary | ICD-10-CM | POA: Diagnosis present

## 2015-11-01 DIAGNOSIS — Z79899 Other long term (current) drug therapy: Secondary | ICD-10-CM | POA: Diagnosis not present

## 2015-11-01 DIAGNOSIS — R52 Pain, unspecified: Secondary | ICD-10-CM | POA: Diagnosis present

## 2015-11-01 DIAGNOSIS — F339 Major depressive disorder, recurrent, unspecified: Secondary | ICD-10-CM | POA: Diagnosis not present

## 2015-11-01 LAB — URINALYSIS, ROUTINE W REFLEX MICROSCOPIC
Bilirubin Urine: NEGATIVE
GLUCOSE, UA: NEGATIVE mg/dL
Ketones, ur: NEGATIVE mg/dL
Nitrite: POSITIVE — AB
Protein, ur: 30 mg/dL — AB
SPECIFIC GRAVITY, URINE: 1.012 (ref 1.005–1.030)
pH: 6 (ref 5.0–8.0)

## 2015-11-01 LAB — CBC WITH DIFFERENTIAL/PLATELET
BASOS ABS: 0.1 10*3/uL (ref 0.0–0.1)
Basophils Relative: 0 %
EOS PCT: 0 %
Eosinophils Absolute: 0 10*3/uL (ref 0.0–0.7)
HCT: 22.9 % — ABNORMAL LOW (ref 36.0–46.0)
Hemoglobin: 7.9 g/dL — ABNORMAL LOW (ref 12.0–15.0)
LYMPHS ABS: 3.5 10*3/uL (ref 0.7–4.0)
LYMPHS PCT: 29 %
MCH: 34.6 pg — AB (ref 26.0–34.0)
MCHC: 34.5 g/dL (ref 30.0–36.0)
MCV: 100.4 fL — AB (ref 78.0–100.0)
MONO ABS: 1 10*3/uL (ref 0.1–1.0)
Monocytes Relative: 8 %
Neutro Abs: 7.8 10*3/uL — ABNORMAL HIGH (ref 1.7–7.7)
Neutrophils Relative %: 63 %
PLATELETS: 352 10*3/uL (ref 150–400)
RBC: 2.28 MIL/uL — ABNORMAL LOW (ref 3.87–5.11)
RDW: 18 % — AB (ref 11.5–15.5)
WBC: 12.3 10*3/uL — ABNORMAL HIGH (ref 4.0–10.5)

## 2015-11-01 LAB — URINE MICROSCOPIC-ADD ON

## 2015-11-01 LAB — RETICULOCYTES
RBC.: 2.28 MIL/uL — ABNORMAL LOW (ref 3.87–5.11)
Retic Count, Absolute: 326 10*3/uL — ABNORMAL HIGH (ref 19.0–186.0)
Retic Ct Pct: 14.3 % — ABNORMAL HIGH (ref 0.4–3.1)

## 2015-11-01 MED ORDER — SODIUM CHLORIDE 0.9% FLUSH: 10.0000 mL | INTRAVENOUS | Status: DC | PRN

## 2015-11-01 MED ORDER — OXYCODONE-ACETAMINOPHEN 10-325 MG PO TABS: 1.0000 | ORAL_TABLET | ORAL | 0 refills | Status: DC | PRN

## 2015-11-01 MED ORDER — SODIUM CHLORIDE 0.9% FLUSH: 9.0000 mL | INTRAVENOUS | Status: DC | PRN

## 2015-11-01 MED ORDER — NALOXONE HCL 0.4 MG/ML IJ SOLN: 0.4000 mg | INTRAMUSCULAR | Status: DC | PRN

## 2015-11-01 MED ORDER — KETOROLAC TROMETHAMINE 30 MG/ML IJ SOLN
30.0000 mg | Freq: Four times a day (QID) | INTRAMUSCULAR | Status: DC
  Administered 2015-11-01: 30 mg via INTRAVENOUS
  Filled 2015-11-01: qty 1

## 2015-11-01 MED ORDER — HYDROMORPHONE 1 MG/ML IV SOLN
INTRAVENOUS | Status: DC
  Administered 2015-11-01: 11:00:00 via INTRAVENOUS
  Administered 2015-11-01: 11.5 mg via INTRAVENOUS
  Administered 2015-11-01: 1 mg via INTRAVENOUS
  Filled 2015-11-01: qty 25

## 2015-11-01 MED ORDER — ONDANSETRON HCL 4 MG/2ML IJ SOLN: 4.0000 mg | Freq: Four times a day (QID) | INTRAMUSCULAR | Status: DC | PRN

## 2015-11-01 MED ORDER — DEXTROSE-NACL 5-0.45 % IV SOLN
INTRAVENOUS | Status: DC
  Administered 2015-11-01: 11:00:00 via INTRAVENOUS

## 2015-11-01 MED ORDER — POLYETHYLENE GLYCOL 3350 17 G PO PACK: 17.0000 g | PACK | Freq: Every day | ORAL | Status: DC | PRN

## 2015-11-01 MED ORDER — SODIUM CHLORIDE 0.9 % IV SOLN
25.0000 mg | INTRAVENOUS | Status: DC | PRN
  Administered 2015-11-01: 25 mg via INTRAVENOUS
  Filled 2015-11-01 (×3): qty 0.5

## 2015-11-01 MED ORDER — SENNOSIDES-DOCUSATE SODIUM 8.6-50 MG PO TABS: 1.0000 | ORAL_TABLET | Freq: Two times a day (BID) | ORAL | Status: DC

## 2015-11-01 MED ORDER — DIPHENHYDRAMINE HCL 25 MG PO CAPS: 25.0000 mg | ORAL_CAPSULE | ORAL | Status: DC | PRN

## 2015-11-01 MED ORDER — HEPARIN SOD (PORK) LOCK FLUSH 100 UNIT/ML IV SOLN
500.0000 [IU] | INTRAVENOUS | Status: AC | PRN
  Administered 2015-11-01: 500 [IU]
  Filled 2015-11-01: qty 5

## 2015-11-01 NOTE — Telephone Encounter (Signed)
[facility]10 pain in bilateral legs and back. Denies fever, chest pain and abdominal pain. Some nausea. Took oxycodone at 6:30am and MS contin at 6 am without relief. Wants to come for treatment. Ubaldo GlassingBridges,Julyanna Scholle Y RN

## 2015-11-01 NOTE — Telephone Encounter (Signed)
Returned call to patient after speaking with Dr. Hyman HopesJegede to let her know to come in for treatment and she agrees.

## 2015-11-01 NOTE — Discharge Summary (Signed)
Physician Discharge Summary  Brittany Foster JOI:[id number] DOB: [birth year] DOA: 11/01/2015  PCP: Angelica Chessman, MD  Admit date: 11/01/2015  Discharge date: 11/01/2015  Time spent: 30 minutes  Discharge Diagnoses:  Active Problems:   Sickle cell anemia with pain [facility])   Discharge Condition: Stable  Diet recommendation: Regular  Filed Weights   11/01/15 1045  Weight: 124 lb (56.2 kg)    History of present illness:  Brittany Foster is a 30 y.o. female with history of sickle cell disease. She presented to the day hospital today with a major complaint of pain in her back, hip joints and legs consistent with her sickle cell pain crisis.Took oxycodone 10 mg and MS contin this morning without relief. Patient is not adherent with her home medications including hydroxyurea, folic acid and even pain medications. She rated her pain at 8 out of 10 today, she denies any fever, no urinary symptoms, no headache, no shortness of breath, no chest pain, no nausea, vomiting or diarrhea. No joint swelling or redness.  Hospital Course:  Brittany Foster was admitted to the day hospital with sickle cell painful crisis. Patient was treated with weight based IV Dilaudid PCA, IV Toradol as well as IV fluids. Brittany Foster showed improvement symptomatically, pain improved from 8 to 5/10 at the time of discharge. Patient was discharged home is a hemodynamically stable condition. Brittany Foster will follow-up at the clinic as previously scheduled, continue with home medications as per prior to admission.  Discharge Instructions We discussed the need for good hydration, monitoring of hydration status, avoidance of heat, cold, stress, and infection triggers. We discussed the need to be compliant with taking Hydrea. Brittany Foster was reminded of the need to seek medical attention of any symptoms of bleeding, anemia, or infection occurs.  Discharge Exam: Vitals:   11/01/15 1329 11/01/15 1428  BP:  (!) 98/48  Pulse: 86 81  Resp: 16 15   Temp:     General appearance: alert, cooperative and no distress Eyes: conjunctivae/corneas clear. PERRL, EOM's intact. Fundi benign. Neck: no adenopathy, no carotid bruit, no JVD, supple, symmetrical, trachea midline and thyroid not enlarged, symmetric, no tenderness/mass/nodules Back: symmetric, no curvature. ROM normal. No CVA tenderness. Resp: clear to auscultation bilaterally Chest wall: no tenderness Cardio: regular rate and rhythm, S1, S2 normal, no murmur, click, rub or gallop GI: soft, non-tender; bowel sounds normal; no masses, no organomegaly Extremities: extremities normal, atraumatic, no cyanosis or edema Pulses: 2+ and symmetric Skin: Skin color, texture, turgor normal. No rashes or lesions Neurologic: Grossly normal  Discharge Instructions    Diet - low sodium heart healthy    Complete by:  As directed   Increase activity slowly    Complete by:  As directed     Current Discharge Medication List    CONTINUE these medications which have NOT CHANGED   Details  DULoxetine (CYMBALTA) 60 MG capsule Take 1 capsule (60 mg total) by mouth daily. Qty: 30 capsule, Refills: 3   Associated Diagnoses: Chronic pain syndrome    folic acid (FOLVITE) 1 MG tablet Take 1 tablet (1 mg total) by mouth daily. 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 MAY TAKE WITH FOOD TO MINIMIZE GI SIDE EFFECTS. Qty: 60 capsule, Refills: 3   Associated Diagnoses: Hb-SS disease without crisis (HCC)    ibuprofen (ADVIL,MOTRIN) 200 MG tablet Take 400 mg by mouth every 6 (six) hours as  needed for moderate pain.     morphine (MS CONTIN) 30 MG 12 hr tablet Take 1 tablet (30 mg total) by mouth every 12 (twelve) hours. Qty: 30 tablet, Refills: 0   Associated Diagnoses: Hb-SS disease without crisis (HCC)    Topiramate ER 100 MG CP24 Take 100 mg by mouth at bedtime.     oxyCODONE-acetaminophen (PERCOCET) 10-325 MG tablet Take 1 tablet by mouth every 4 (four) hours as needed for pain. Qty: 60 tablet, Refills: 0       Allergies  Allergen Reactions  . Ultram [Tramadol] Other (See Comments)    seizures  . Zofran [Ondansetron Hcl] Nausea And Vomiting  . Buprenorphine Hcl Hives and Rash    Shaking Tolerates Percocet, Norco, and buprenorphine  . Morphine And Related Hives, Rash and Other (See Comments)    Shaking Tolerates Percocet, Norco, Dilaudid, and buprenorphine  . Tape Rash     Significant Diagnostic Studies: Dg Chest 2 View  Result Date: 10/19/2015 CLINICAL DATA:  Pt c/o central chest pain radiating to left side onset x approx. 4 hours ago. Also c/o nausea and fever. H/o sickle cell and heart attack x 15 years ago. EXAM: CHEST  2 VIEW COMPARISON:  10/08/2015 FINDINGS: Cardiac silhouette is top-normal in size. No mediastinal or hilar masses or evidence of adenopathy. Lungs are clear.  No pleural effusion or pneumothorax. Right internal jugular central venous line catheter tip projects in the lower superior vena cava near the caval atrial junction. Bony thorax is unremarkable. IMPRESSION: No active cardiopulmonary disease. Electronically Signed   By: Amie Portland M.D.   On: 10/19/2015 13:56   Dg Chest 2 View  Result Date: 10/08/2015 CLINICAL DATA:  Sickle cell disease and complains of chest pain. Mild shortness of breath. EXAM: CHEST  2 VIEW COMPARISON:  09/02/2015 FINDINGS: Right subclavian Port-A-Cath has been removed and the patient now has a right jugular central line with the tip near the superior cavoatrial junction. There is a chronic catheter fragment in the left innominate vein that is poorly characterized on this examination. Both lungs are clear without pulmonary edema or airspace disease. Heart and mediastinum are within normal limits. The trachea is midline. No pleural effusions. No acute bone abnormality. IMPRESSION: No active  cardiopulmonary disease. Central line tip at the superior cavoatrial junction. Electronically Signed   By: Richarda Overlie M.D.   On: 10/08/2015 16:53   Ct Angio Chest Pe W And/or Wo Contrast  Result Date: 10/19/2015 CLINICAL DATA:  Chest pain and shortness of breath. Fever. History sickle cell anemia. EXAM: CT ANGIOGRAPHY CHEST WITH CONTRAST TECHNIQUE: Multidetector CT imaging of the chest was performed using the standard protocol during bolus administration of intravenous contrast. Multiplanar CT image reconstructions and MIPs were obtained to evaluate the vascular anatomy. CONTRAST:  100 cc of Isovue 370 COMPARISON:  Chest radiograph of earlier today. Most recent CT 09/02/2015. FINDINGS: Cardiovascular: The quality of this exam for evaluation of pulmonary embolism is excellent. No evidence of pulmonary embolism. Normal aortic caliber without dissection. Heart size upper normal. Trace pericardial fluid is likely physiologic. Catheter fragment again identified within the left brachiocephalic vein. A right-sided PICC line terminates at the low SVC. Mediastinum/Nodes: No mediastinal or hilar adenopathy. Minimal residual thymus in the anterior mediastinum. Lungs/Pleura: No pleural fluid.  Clear lungs. Upper Abdomen: Normal imaged portions of the liver, adrenal glands, kidneys. Hyperattenuation throughout the spleen is likely related to sickle cell anemia and prior infarcts. Musculoskeletal: Mildly increased density within the T7 and T9  vertebral bodies may relate to sickle cell anemia induced infarcts. This is similar. Review of the MIP images confirms the above findings. IMPRESSION: 1.  No evidence of pulmonary embolism. 2.  No acute process in the chest. 3. Catheter fragment in the left brachiocephalic vein, unchanged including back to 2015. Electronically Signed   By: Abigail Miyamoto M.D.   On: 10/19/2015 17:02   Ir Fluoro Guide Cv Line Right  Result Date: 10/08/2015 INDICATION: History of sickle cell disease, in  need of durable intravenous access for frequent blood draws and medication administration. Note, patient has history of prior Port a catheter placement however recently had Port a catheter removed secondary to bacteremia. As such, request made made for placement of a tunneled central venous catheter in lieu of placement of a formal Port a Catheter. EXAM: TUNNELED PICC LINE WITH ULTRASOUND AND FLUOROSCOPIC GUIDANCE MEDICATIONS: None ANESTHESIA/SEDATION: None COMPLICATIONS: None immediate. PROCEDURE: Informed written consent was obtained from the patient after a discussion of the risks, benefits, and alternatives to treatment. Questions regarding the procedure were encouraged and answered. The right neck and chest were prepped with chlorhexidine in a sterile fashion, and a sterile drape was applied covering the operative field. Maximum barrier sterile technique with sterile gowns and gloves were used for the procedure. A timeout was performed prior to the initiation of the procedure. After creating a small venotomy incision, a micropuncture kit was utilized to access the right internal jugular vein under direct, real-time ultrasound guidance after the overlying soft tissues were anesthetized with 1% lidocaine with epinephrine. Ultrasound image documentation was performed. The microwire was kinked to measure appropriate catheter length. The micropuncture sheath was exchanged for a peel-away sheath over a guidewire. A 5 French single lumen tunneled PICC measuring 20 cm was tunneled in a retrograde fashion from the anterior chest wall to the venotomy incision. The catheter was then placed through the peel-away sheath with tip ultimately positioned at the superior caval-atrial junction. Final catheter positioning was confirmed and documented with a spot radiographic image. The catheter aspirates and flushes normally. The catheter was flushed with appropriate volume heparin dwells. The catheter exit site was secured with a  0-Prolene retention suture. The venotomy incision was closed with an interrupted 4-0 Vicryl, Dermabond and Steri-strips. Dressings were applied. The patient tolerated the procedure well without immediate post procedural complication. FINDINGS: After catheter placement, the tip lies within the superior cavoatrial junction. The catheter aspirates and flushes normally and is ready for immediate use. Re- demonstrated abandoned catheter tubing overlying the expected location of the central aspect of the left innominate vein. IMPRESSION: Successful placement of 20 cm single lumen tunneled PICC catheter via the right internal jugular vein with tip terminating at the superior caval atrial junction. The catheter is ready for immediate use. Electronically Signed   By: Sandi Mariscal M.D.   On: 10/08/2015 17:04   Ir US Guide Vasc Access Right  Result Date: 10/08/2015 INDICATION: History of sickle cell disease, in need of durable intravenous access for frequent blood draws and medication administration. Note, patient has history of prior Port a catheter placement however recently had Port a catheter removed secondary to bacteremia. As such, request made made for placement of a tunneled central venous catheter in lieu of placement of a formal Port a Catheter. EXAM: TUNNELED PICC LINE WITH ULTRASOUND AND FLUOROSCOPIC GUIDANCE MEDICATIONS: None ANESTHESIA/SEDATION: None COMPLICATIONS: None immediate. PROCEDURE: Informed written consent was obtained from the patient after a discussion of the risks, benefits, and alternatives to  treatment. Questions regarding the procedure were encouraged and answered. The right neck and chest were prepped with chlorhexidine in a sterile fashion, and a sterile drape was applied covering the operative field. Maximum barrier sterile technique with sterile gowns and gloves were used for the procedure. A timeout was performed prior to the initiation of the procedure. After creating a small venotomy  incision, a micropuncture kit was utilized to access the right internal jugular vein under direct, real-time ultrasound guidance after the overlying soft tissues were anesthetized with 1% lidocaine with epinephrine. Ultrasound image documentation was performed. The microwire was kinked to measure appropriate catheter length. The micropuncture sheath was exchanged for a peel-away sheath over a guidewire. A 5 French single lumen tunneled PICC measuring 20 cm was tunneled in a retrograde fashion from the anterior chest wall to the venotomy incision. The catheter was then placed through the peel-away sheath with tip ultimately positioned at the superior caval-atrial junction. Final catheter positioning was confirmed and documented with a spot radiographic image. The catheter aspirates and flushes normally. The catheter was flushed with appropriate volume heparin dwells. The catheter exit site was secured with a 0-Prolene retention suture. The venotomy incision was closed with an interrupted 4-0 Vicryl, Dermabond and Steri-strips. Dressings were applied. The patient tolerated the procedure well without immediate post procedural complication. FINDINGS: After catheter placement, the tip lies within the superior cavoatrial junction. The catheter aspirates and flushes normally and is ready for immediate use. Re- demonstrated abandoned catheter tubing overlying the expected location of the central aspect of the left innominate vein. IMPRESSION: Successful placement of 20 cm single lumen tunneled PICC catheter via the right internal jugular vein with tip terminating at the superior caval atrial junction. The catheter is ready for immediate use. Electronically Signed   By: Sandi Mariscal M.D.   On: 10/08/2015 17:04   Signed:  Angelica Chessman MD, [place], [place], [place], CPE   11/01/2015, 4:25 PM

## 2015-11-01 NOTE — Discharge Instructions (Signed)
Increase activity slowly Diet low sodium heart healthy Sickle Cell Anemia, Adult Sickle cell anemia is a condition where your red blood cells are shaped like sickles. Red blood cells carry oxygen through the body. Sickle-shaped red blood cells do not live as long as normal red blood cells. They also clump together and block blood from flowing through the blood vessels. These things prevent the body from getting enough oxygen. Sickle cell anemia causes organ damage and pain. It also increases the risk of infection. HOME CARE  Drink enough fluid to keep your pee (urine) clear or pale yellow. Drink more in hot weather and during exercise.  Do not smoke. Smoking lowers oxygen levels in the blood.  Only take over-the-counter or prescription medicines as told by your doctor.  Take antibiotic medicines as told by your doctor. Make sure you finish them even if you start to feel better.  Take supplements as told by your doctor.  Consider wearing a medical alert bracelet. This tells anyone caring for you in an emergency of your condition.  When traveling, keep your medical information, doctors' names, and the medicines you take with you at all times.  If you have a fever, do not take fever medicines right away. This could cover up a problem. Tell your doctor.   Keep all follow-up visits with your doctor. Sickle cell anemia requires regular medical care. GET HELP IF: You have a fever. GET HELP RIGHT AWAY IF:  You feel dizzy or faint.  You have new belly (abdominal) pain, especially on the left side near the stomach area.  You have a lasting, often uncomfortable and painful erection of the penis (priapism). If it is not treated right away, you will become unable to have sex (impotence).  You have numbness in your arms or legs or you have a hard time moving them.  You have a hard time talking.  You have a fever or lasting symptoms for more than 2-3 days.  You have a fever and your symptoms  suddenly get worse.  You have signs or symptoms of infection. These include:  Chills.  Being more tired than normal (lethargy).  Irritability.  Poor eating.  Throwing up (vomiting).  You have pain that is not helped with medicine.  You have shortness of breath.  You have pain in your chest.  You are coughing up pus-like or bloody mucus.  You have a stiff neck.  Your feet or hands swell or have pain.  Your belly looks bloated.  Your joints hurt. MAKE SURE YOU:  Understand these instructions.  Will watch your condition.  Will get help right away if you are not doing well or get worse.   This information is not intended to replace advice given to you by your health care provider. Make sure you discuss any questions you have with your health care provider.   Document Released: 12/07/2012 Document Revised: 07/03/2014 Document Reviewed: 12/07/2012 Elsevier Interactive Patient Education [employer]2016 [employer]

## 2015-11-01 NOTE — Progress Notes (Signed)
Patient ID: Brittany CampbellMiranda Foster, female   DOB: 07/01/1985, 30 y.o.   MRN: [id number] Pt of Dr.Jegede [facility] evaluation of 8/10 pain in legs, back and arms. Pt received IV fluids, PCA hydromorphone, antiinflammatory med. Pt tolerated procedure well. One time PRN benadryl IV 30 min infusion was given for itching. Pain level went down to 6/10 with goal of 5 upon completion. Pt is a/o, ambulatory, discharge instructions given, pt understands. Discharge to home. [name], [name],RN

## 2015-11-01 NOTE — H&P (Signed)
[facility] History and Physical  Brittany CampbellMiranda Golubski WUJ:[id number] DOB: 06/18/1985 DOA: 11/01/2015  PCP: Jeanann LewandowskyJEGEDE, Janese Radabaugh, MD   Chief Complaint: General body pain  HPI: Brittany Foster is a 30 y.o. female with history of sickle cell disease. She presented to the day hospital today with a major complaint of pain in her back, hip joints and legs consistent with her sickle cell pain crisis.Took oxycodone 10 mg and MS contin this morning without relief. Patient is not adherent with her home medications including hydroxyurea, folic acid and even pain medications. She rated her pain at 8 out of 10 today, she denies any fever, no urinary symptoms, no headache, no shortness of breath, no chest pain, no nausea, vomiting or diarrhea. No joint swelling or redness.  Systemic Review: General: The patient denies anorexia, fever, weight loss Cardiac: Denies chest pain, syncope, palpitations, pedal edema  Respiratory: Denies cough, shortness of breath, wheezing GI: Denies severe indigestion/heartburn, abdominal pain, nausea, vomiting, diarrhea and constipation GU: Denies hematuria, incontinence, dysuria  Musculoskeletal: Denies arthritis  Skin: Denies suspicious skin lesions Neurologic: Denies focal weakness or numbness, change in vision  Past Medical History:  Diagnosis Date  . Anemia   . Depression, major, recurrent (HCC)   . Migraines   . Sickle cell anemia (HCC)     Past Surgical History:  Procedure Laterality Date  . CESAREAN SECTION    . CHOLECYSTECTOMY  2000  . IR GENERIC HISTORICAL  10/08/2015   IR US GUIDE VASC ACCESS RIGHT 10/08/2015 Simonne ComeJohn Watts, MD [facility]  . IR GENERIC HISTORICAL  10/08/2015   IR FLUORO GUIDE CV LINE RIGHT 10/08/2015 Simonne ComeJohn Watts, MD [facility]  . MULTIPLE TOOTH EXTRACTIONS N/A   . port a cath placement Right    about 6-7 years ago  . removal of porta cath Right 09/11/15  . TUBAL LIGATION      Allergies  Allergen Reactions  . Ultram [Tramadol] Other  (See Comments)    seizures  . Zofran [Ondansetron Hcl] Nausea And Vomiting  . Buprenorphine Hcl Hives and Rash    Shaking Tolerates Percocet, Norco, and buprenorphine  . Morphine And Related Hives, Rash and Other (See Comments)    Shaking Tolerates Percocet, Norco, Dilaudid, and buprenorphine  . Tape Rash    Family History  Problem Relation Age of Onset  . Sickle cell trait Father   . Sickle cell trait Mother   . Sickle cell anemia Other       Prior to Admission medications   Medication Sig Start Date End Date Taking? Authorizing Provider  DULoxetine (CYMBALTA) 60 MG capsule Take 1 capsule (60 mg total) by mouth daily. 08/08/15   Massie MaroonLachina M [name], FNP  folic acid (FOLVITE) 1 MG tablet Take 1 tablet (1 mg total) by mouth daily. 08/08/15   Massie MaroonLachina M [name], FNP  hydroxyurea (HYDREA) 500 MG capsule TAKE 2 CAPSULES BY MOUTH DAILY. MAY TAKE WITH FOOD TO MINIMIZE GI SIDE EFFECTS. Patient taking differently: Take 500 mg by mouth daily. MAY TAKE WITH FOOD TO MINIMIZE GI SIDE EFFECTS. 08/08/15   Massie MaroonLachina M [name], FNP  ibuprofen (ADVIL,MOTRIN) 200 MG tablet Take 400 mg by mouth every 6 (six) hours as needed for moderate pain.     Historical Provider, MD  morphine (MS CONTIN) 30 MG 12 hr tablet Take 1 tablet (30 mg total) by mouth every 12 (twelve) hours. 10/25/15   Henrietta HooverLinda C Bernhardt, NP  oxyCODONE-acetaminophen (PERCOCET) 10-325 MG tablet Take 1 tablet by mouth every 4 (four)  hours as needed for pain. 10/25/15   Henrietta Hoover, NP  Topiramate ER 100 MG CP24 Take 100 mg by mouth at bedtime.    Historical Provider, MD     Physical Exam: There were no vitals filed for this visit.  General: Alert, awake, afebrile, anicteric, not in obvious distress HEENT: Normocephalic and Atraumatic, Mucous membranes pink                PERRLA; EOM intact; No scleral icterus,                 Nares: Patent, Oropharynx: Clear, Fair Dentition                 Neck: FROM, no cervical lymphadenopathy, thyromegaly,  carotid bruit or JVD;  CHEST WALL: No tenderness  CHEST: Normal respiration, clear to auscultation bilaterally  HEART: Regular rate and rhythm; no murmurs rubs or gallops  BACK: No kyphosis or scoliosis; no CVA tenderness  ABDOMEN: Positive Bowel Sounds, soft, non-tender; no masses, no organomegaly EXTREMITIES: No cyanosis, clubbing, or edema SKIN:  no rash or ulceration  CNS: Alert and Oriented x 4, Nonfocal exam, CN 2-12 intact  Labs on Admission:  Basic Metabolic Panel:  Recent Labs Lab 10/27/15 0430  NA 139  K 3.5  CL 114*  CO2 20*  GLUCOSE 137*  BUN 6  CREATININE 0.48  CALCIUM 8.7*   Liver Function Tests:  Recent Labs Lab 10/27/15 0430  AST 46*  ALT 25  ALKPHOS 70  BILITOT 1.5*  PROT 7.0  ALBUMIN 3.8   No results for input(s): LIPASE, AMYLASE in the last 168 hours. No results for input(s): AMMONIA in the last 168 hours. CBC:  Recent Labs Lab 10/27/15 0430  WBC 9.0  NEUTROABS 3.9  HGB 8.3*  HCT 23.5*  MCV 99.6  PLT 249   Cardiac Enzymes: No results for input(s): CKTOTAL, CKMB, CKMBINDEX, TROPONINI in the last 168 hours.  BNP (last 3 results) No results for input(s): BNP in the last 8760 hours.  ProBNP (last 3 results) No results for input(s): PROBNP in the last 8760 hours.  CBG: No results for input(s): GLUCAP in the last 168 hours.   Assessment/Plan Active Problems:   Sickle cell anemia with pain (HCC)   Admits to the Day Hospital  IVF D5 .45% Saline @ 150 mls/hour  Weight based Dilaudid PCA started within 30 minutes of admission  IV Toradol 30 mg Q 6 H  Monitor vitals very closely, Re-evaluate pain scale every hour  2 L of Oxygen by [place]  Patient will be re-evaluated for pain in the context of function and relationship to baseline as care progresses.  If no significant relieve from pain (remains above 5/10) will transfer patient to inpatient services for further evaluation and management  Code Status: Full  Family  Communication: None  DVT Prophylaxis: Ambulate as tolerated   Time spent: 35 Minutes  Johnta Couts, MD, MHA, FACP, FAAP, CPE  If 7PM-7AM, please contact night-coverage [email or web address] 11/01/2015, 10:48 AM

## 2015-11-04 ENCOUNTER — Emergency Department (HOSPITAL_COMMUNITY): Payer: Medicaid Other

## 2015-11-04 ENCOUNTER — Encounter (HOSPITAL_COMMUNITY): Payer: Self-pay | Admitting: Emergency Medicine

## 2015-11-04 ENCOUNTER — Inpatient Hospital Stay (HOSPITAL_COMMUNITY)
Admission: EM | Admit: 2015-11-04 | Discharge: 2015-11-11 | DRG: 812 | Disposition: A | Discharge status: Home / Self Care | Payer: Medicaid Other | Attending: Internal Medicine | Admitting: Internal Medicine

## 2015-11-04 DIAGNOSIS — Z9049 Acquired absence of other specified parts of digestive tract: Secondary | ICD-10-CM

## 2015-11-04 DIAGNOSIS — G629 Polyneuropathy, unspecified: Secondary | ICD-10-CM | POA: Diagnosis present

## 2015-11-04 DIAGNOSIS — F339 Major depressive disorder, recurrent, unspecified: Secondary | ICD-10-CM | POA: Diagnosis present

## 2015-11-04 DIAGNOSIS — Z9109 Other allergy status, other than to drugs and biological substances: Secondary | ICD-10-CM

## 2015-11-04 DIAGNOSIS — Z86711 Personal history of pulmonary embolism: Secondary | ICD-10-CM

## 2015-11-04 DIAGNOSIS — I313 Pericardial effusion (noninflammatory): Secondary | ICD-10-CM | POA: Diagnosis present

## 2015-11-04 DIAGNOSIS — G894 Chronic pain syndrome: Secondary | ICD-10-CM | POA: Diagnosis present

## 2015-11-04 DIAGNOSIS — E876 Hypokalemia: Secondary | ICD-10-CM | POA: Diagnosis present

## 2015-11-04 DIAGNOSIS — Z885 Allergy status to narcotic agent status: Secondary | ICD-10-CM

## 2015-11-04 DIAGNOSIS — Z79891 Long term (current) use of opiate analgesic: Secondary | ICD-10-CM

## 2015-11-04 DIAGNOSIS — Z888 Allergy status to other drugs, medicaments and biological substances status: Secondary | ICD-10-CM

## 2015-11-04 DIAGNOSIS — D638 Anemia in other chronic diseases classified elsewhere: Secondary | ICD-10-CM | POA: Diagnosis present

## 2015-11-04 DIAGNOSIS — Z79899 Other long term (current) drug therapy: Secondary | ICD-10-CM

## 2015-11-04 DIAGNOSIS — R0789 Other chest pain: Secondary | ICD-10-CM | POA: Diagnosis present

## 2015-11-04 DIAGNOSIS — Z87891 Personal history of nicotine dependence: Secondary | ICD-10-CM

## 2015-11-04 DIAGNOSIS — D5700 Hb-SS disease with crisis, unspecified: Secondary | ICD-10-CM

## 2015-11-04 DIAGNOSIS — I252 Old myocardial infarction: Secondary | ICD-10-CM | POA: Diagnosis not present

## 2015-11-04 DIAGNOSIS — D72829 Elevated white blood cell count, unspecified: Secondary | ICD-10-CM | POA: Diagnosis present

## 2015-11-04 DIAGNOSIS — R079 Chest pain, unspecified: Secondary | ICD-10-CM | POA: Diagnosis present

## 2015-11-04 DIAGNOSIS — F329 Major depressive disorder, single episode, unspecified: Secondary | ICD-10-CM | POA: Diagnosis present

## 2015-11-04 DIAGNOSIS — F32A Depression, unspecified: Secondary | ICD-10-CM | POA: Diagnosis present

## 2015-11-04 DIAGNOSIS — N390 Urinary tract infection, site not specified: Secondary | ICD-10-CM | POA: Diagnosis present

## 2015-11-04 DIAGNOSIS — N3000 Acute cystitis without hematuria: Secondary | ICD-10-CM | POA: Diagnosis not present

## 2015-11-04 LAB — CBC WITH DIFFERENTIAL/PLATELET
BASOS ABS: 0 10*3/uL (ref 0.0–0.1)
BASOS PCT: 0 %
EOS ABS: 0 10*3/uL (ref 0.0–0.7)
EOS PCT: 0 %
HCT: 24.8 % — ABNORMAL LOW (ref 36.0–46.0)
Hemoglobin: 8.6 g/dL — ABNORMAL LOW (ref 12.0–15.0)
LYMPHS PCT: 22 %
Lymphs Abs: 2.7 10*3/uL (ref 0.7–4.0)
MCH: 34.5 pg — ABNORMAL HIGH (ref 26.0–34.0)
MCHC: 34.7 g/dL (ref 30.0–36.0)
MCV: 99.6 fL (ref 78.0–100.0)
MONO ABS: 1.1 10*3/uL — AB (ref 0.1–1.0)
Monocytes Relative: 9 %
Neutro Abs: 8.2 10*3/uL — ABNORMAL HIGH (ref 1.7–7.7)
Neutrophils Relative %: 69 %
PLATELETS: 368 10*3/uL (ref 150–400)
RBC: 2.49 MIL/uL — ABNORMAL LOW (ref 3.87–5.11)
RDW: 18.2 % — AB (ref 11.5–15.5)
WBC: 12 10*3/uL — ABNORMAL HIGH (ref 4.0–10.5)

## 2015-11-04 LAB — COMPREHENSIVE METABOLIC PANEL
ALBUMIN: 4.3 g/dL (ref 3.5–5.0)
ALK PHOS: 69 U/L (ref 38–126)
ALT: 23 U/L (ref 14–54)
AST: 36 U/L (ref 15–41)
Anion gap: 7 (ref 5–15)
BILIRUBIN TOTAL: 1.6 mg/dL — AB (ref 0.3–1.2)
BUN: 6 mg/dL (ref 6–20)
CALCIUM: 9 mg/dL (ref 8.9–10.3)
CO2: 21 mmol/L — ABNORMAL LOW (ref 22–32)
CREATININE: 0.55 mg/dL (ref 0.44–1.00)
Chloride: 112 mmol/L — ABNORMAL HIGH (ref 101–111)
GFR calc Af Amer: >60 mL/min (ref >60)
GFR calc non Af Amer: >60 mL/min (ref >60)
GLUCOSE: 123 mg/dL — AB (ref 65–99)
Potassium: 2.9 mmol/L — ABNORMAL LOW (ref 3.5–5.1)
Sodium: 140 mmol/L (ref 135–145)
TOTAL PROTEIN: 7.5 g/dL (ref 6.5–8.1)

## 2015-11-04 LAB — RETICULOCYTES
RBC.: 2.46 MIL/uL — ABNORMAL LOW (ref 3.87–5.11)
RETIC COUNT ABSOLUTE: 260.8 10*3/uL — AB (ref 19.0–186.0)
Retic Ct Pct: 10.6 % — ABNORMAL HIGH (ref 0.4–3.1)

## 2015-11-04 LAB — TROPONIN I: Troponin I: <0.03 ng/mL (ref <0.03)

## 2015-11-04 MED ORDER — NALOXONE HCL 0.4 MG/ML IJ SOLN: 0.4000 mg | INTRAMUSCULAR | Status: DC | PRN

## 2015-11-04 MED ORDER — HYDROMORPHONE HCL 2 MG/ML IJ SOLN
0.0250 mg/kg | INTRAMUSCULAR | Status: AC
  Administered 2015-11-04: 1.4 mg via INTRAVENOUS
  Filled 2015-11-04: qty 1

## 2015-11-04 MED ORDER — KETOROLAC TROMETHAMINE 30 MG/ML IJ SOLN
30.0000 mg | Freq: Four times a day (QID) | INTRAMUSCULAR | Status: AC
  Administered 2015-11-05 – 2015-11-09 (×20): 30 mg via INTRAVENOUS
  Filled 2015-11-04 (×20): qty 1

## 2015-11-04 MED ORDER — HYDROMORPHONE HCL 2 MG/ML IJ SOLN: 0.0250 mg/kg | INTRAMUSCULAR | Status: AC

## 2015-11-04 MED ORDER — ACETAMINOPHEN 325 MG PO TABS: 650.0000 mg | ORAL_TABLET | Freq: Four times a day (QID) | ORAL | Status: DC | PRN

## 2015-11-04 MED ORDER — SODIUM CHLORIDE 0.9 % IV SOLN
25.0000 mg | Freq: Once | INTRAVENOUS | Status: AC
  Administered 2015-11-04: 25 mg via INTRAVENOUS
  Filled 2015-11-04: qty 1

## 2015-11-04 MED ORDER — SODIUM CHLORIDE 0.9% FLUSH
3.0000 mL | Freq: Two times a day (BID) | INTRAVENOUS | Status: DC
  Administered 2015-11-06 – 2015-11-09 (×2): 3 mL via INTRAVENOUS

## 2015-11-04 MED ORDER — MORPHINE SULFATE ER 30 MG PO TBCR
30.0000 mg | EXTENDED_RELEASE_TABLET | Freq: Two times a day (BID) | ORAL | Status: DC
  Administered 2015-11-05 – 2015-11-11 (×13): 30 mg via ORAL
  Filled 2015-11-04 (×14): qty 1

## 2015-11-04 MED ORDER — POTASSIUM CHLORIDE CRYS ER 20 MEQ PO TBCR
40.0000 meq | EXTENDED_RELEASE_TABLET | Freq: Once | ORAL | Status: AC
  Administered 2015-11-04: 40 meq via ORAL
  Filled 2015-11-04: qty 2

## 2015-11-04 MED ORDER — HYDROMORPHONE HCL 2 MG/ML IJ SOLN
0.0375 mg/kg | INTRAMUSCULAR | Status: AC
  Administered 2015-11-04: 2.1 mg via INTRAVENOUS
  Filled 2015-11-04: qty 2

## 2015-11-04 MED ORDER — FAMOTIDINE 20 MG PO TABS
20.0000 mg | ORAL_TABLET | Freq: Two times a day (BID) | ORAL | Status: DC
  Administered 2015-11-09: 20 mg via ORAL
  Filled 2015-11-04 (×5): qty 1

## 2015-11-04 MED ORDER — HYDROMORPHONE 1 MG/ML IV SOLN
INTRAVENOUS | Status: DC
  Administered 2015-11-05: 5.3 mg via INTRAVENOUS
  Administered 2015-11-05: 1.6 mg via INTRAVENOUS
  Administered 2015-11-05: 2.4 mg via INTRAVENOUS
  Administered 2015-11-05: 1 mg via INTRAVENOUS
  Filled 2015-11-04: qty 25

## 2015-11-04 MED ORDER — DEXTROSE 5 % IV BOLUS
1000.0000 mL | Freq: Once | INTRAVENOUS | Status: AC
  Administered 2015-11-04: 1000 mL via INTRAVENOUS

## 2015-11-04 MED ORDER — TOPIRAMATE 100 MG PO TABS
100.0000 mg | ORAL_TABLET | Freq: Every day | ORAL | Status: DC
  Administered 2015-11-05 – 2015-11-10 (×6): 100 mg via ORAL
  Filled 2015-11-04 (×6): qty 1

## 2015-11-04 MED ORDER — DIPHENHYDRAMINE HCL 50 MG/ML IJ SOLN
12.5000 mg | Freq: Four times a day (QID) | INTRAMUSCULAR | Status: DC | PRN
  Administered 2015-11-05 (×2): 12.5 mg via INTRAVENOUS
  Filled 2015-11-04 (×2): qty 1

## 2015-11-04 MED ORDER — ENOXAPARIN SODIUM 40 MG/0.4ML ~~LOC~~ SOLN
40.0000 mg | Freq: Every day | SUBCUTANEOUS | Status: DC
  Filled 2015-11-04 (×2): qty 0.4

## 2015-11-04 MED ORDER — DIPHENHYDRAMINE HCL 25 MG PO CAPS
25.0000 mg | ORAL_CAPSULE | Freq: Once | ORAL | Status: DC
  Filled 2015-11-04: qty 1

## 2015-11-04 MED ORDER — SENNOSIDES-DOCUSATE SODIUM 8.6-50 MG PO TABS
1.0000 | ORAL_TABLET | Freq: Two times a day (BID) | ORAL | Status: DC
  Filled 2015-11-04 (×5): qty 1

## 2015-11-04 MED ORDER — POTASSIUM CHLORIDE IN NACL 20-0.45 MEQ/L-% IV SOLN
INTRAVENOUS | Status: DC
  Administered 2015-11-04 – 2015-11-11 (×6): via INTRAVENOUS
  Filled 2015-11-04 (×8): qty 1000

## 2015-11-04 MED ORDER — FOLIC ACID 1 MG PO TABS
1.0000 mg | ORAL_TABLET | Freq: Every day | ORAL | Status: DC
  Administered 2015-11-05 – 2015-11-11 (×6): 1 mg via ORAL
  Filled 2015-11-04 (×7): qty 1

## 2015-11-04 MED ORDER — HYDROXYUREA 500 MG PO CAPS
500.0000 mg | ORAL_CAPSULE | Freq: Every day | ORAL | Status: DC
  Administered 2015-11-05 – 2015-11-11 (×6): 500 mg via ORAL
  Filled 2015-11-04 (×10): qty 1

## 2015-11-04 MED ORDER — DULOXETINE HCL 60 MG PO CPEP
60.0000 mg | ORAL_CAPSULE | Freq: Every day | ORAL | Status: DC
  Administered 2015-11-05 – 2015-11-11 (×6): 60 mg via ORAL
  Filled 2015-11-04: qty 2
  Filled 2015-11-04 (×3): qty 1
  Filled 2015-11-04 (×4): qty 2
  Filled 2015-11-04 (×3): qty 1
  Filled 2015-11-04: qty 2
  Filled 2015-11-04: qty 1
  Filled 2015-11-04: qty 2

## 2015-11-04 MED ORDER — KETOROLAC TROMETHAMINE 30 MG/ML IJ SOLN
30.0000 mg | INTRAMUSCULAR | Status: AC
  Administered 2015-11-04: 30 mg via INTRAVENOUS
  Filled 2015-11-04: qty 1

## 2015-11-04 MED ORDER — DIPHENHYDRAMINE HCL 12.5 MG/5ML PO ELIX: 12.5000 mg | ORAL_SOLUTION | Freq: Four times a day (QID) | ORAL | Status: DC | PRN

## 2015-11-04 MED ORDER — HYDROMORPHONE HCL 1 MG/ML IJ SOLN
1.0000 mg | Freq: Once | INTRAMUSCULAR | Status: AC
  Administered 2015-11-04: 1 mg via INTRAVENOUS
  Filled 2015-11-04: qty 1

## 2015-11-04 MED ORDER — HYDROMORPHONE HCL 2 MG/ML IJ SOLN
0.0375 mg/kg | INTRAMUSCULAR | Status: AC
Start: 2015-11-04 — End: 2015-11-04

## 2015-11-04 MED ORDER — SODIUM CHLORIDE 0.9 % IV SOLN
25.0000 mg | Freq: Once | INTRAVENOUS | Status: AC
  Administered 2015-11-04: 25 mg via INTRAVENOUS
  Filled 2015-11-04 (×2): qty 0.5

## 2015-11-04 MED ORDER — SODIUM CHLORIDE 0.9% FLUSH: 9.0000 mL | INTRAVENOUS | Status: DC | PRN

## 2015-11-04 MED ORDER — POLYETHYLENE GLYCOL 3350 17 G PO PACK: 17.0000 g | PACK | Freq: Every day | ORAL | Status: DC | PRN

## 2015-11-04 NOTE — ED Provider Notes (Signed)
[facility]-EMERGENCY DEPT Provider Note   CSN: [id number] Arrival date & time: 11/04/15  1449     History   Chief Complaint Chief Complaint  Patient presents with  . Sickle Cell Pain Crisis    HPI Brittany Foster is a 30 y.o. female.  The history is provided by the patient.  Sickle Cell Pain Crisis  Location:  Chest Severity:  Moderate Onset quality:  Sudden Duration:  18 hours Similar to previous crisis episodes: no (does not usually involve chest)   Timing:  Constant Progression:  Unchanged Sickle cell genotype:  SS History of pulmonary emboli: yes   Context: stress   Relieved by:  Nothing Worsened by:  Movement Ineffective treatments:  Hydroxyurea, prescription drugs and rest Associated symptoms: no cough and no vomiting   Risk factors: frequent admissions for pain and frequent pain crises     Past Medical History:  Diagnosis Date  . Anemia   . Depression, major, recurrent (HCC)   . Migraines   . Sickle cell anemia [facility](HCC)     Patient Active Problem List   Diagnosis Date Noted  . Vasoocclusive sickle cell crisis (HCC) 08/21/2015  . Depression, major, recurrent (HCC)   . Family planning, Depo-Provera contraception monitoring/administration 08/08/2015  . Sickle cell anemia with crisis (HCC) 07/12/2015  . Sickle cell anemia with pain (HCC) 05/28/2015  . Intractable pain   . Chest pain 03/07/2015  . Leukocytosis 03/07/2015  . Vitamin D deficiency 01/08/2015  . Chronic pain syndrome 01/08/2015  . Sickle cell crisis (HCC) 10/22/2014  . Hypokalemia 08/21/2014  . Depression 08/09/2014  . 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 Sickle cell disease with crisis (HCC) 07/12/2014  . Hb-SS disease with crisis (HCC) 06/11/2014  . Pericardial effusion   . Anemia 02/09/2014  . Neuropathy (HCC) 02/09/2014  . Chronic headache disorder 02/09/2014  . Abnormal CT scan, chest   . Sickle cell pain crisis (HCC) 12/30/2013    Past Surgical History:  Procedure Laterality  Date  . CESAREAN SECTION    . CHOLECYSTECTOMY  2000  . IR GENERIC HISTORICAL  10/08/2015   IR US GUIDE VASC ACCESS RIGHT 10/08/2015 Simonne ComeJohn Watts, MD [facility]  . IR GENERIC HISTORICAL  10/08/2015   IR FLUORO GUIDE CV LINE RIGHT 10/08/2015 Simonne ComeJohn Watts, MD [facility]  . MULTIPLE TOOTH EXTRACTIONS N/A   . port a cath placement Right    about 6-7 years ago  . removal of porta cath Right 09/11/15  . TUBAL LIGATION      OB History    No data available       Home Medications    Prior to Admission medications   Medication Sig Start Date End Date Taking? Authorizing Provider  DULoxetine (CYMBALTA) 60 MG capsule Take 1 capsule (60 mg total) by mouth daily. 08/08/15   Massie MaroonLachina M [name], FNP  folic acid (FOLVITE) 1 MG tablet Take 1 tablet (1 mg total) by mouth daily. 08/08/15   Massie MaroonLachina M [name], FNP  hydroxyurea (HYDREA) 500 MG capsule TAKE 2 CAPSULES BY MOUTH DAILY. MAY TAKE WITH FOOD TO MINIMIZE GI SIDE EFFECTS. Patient taking differently: Take 500 mg by mouth daily. MAY TAKE WITH FOOD TO MINIMIZE GI SIDE EFFECTS. 08/08/15   Massie MaroonLachina M [name], FNP  ibuprofen (ADVIL,MOTRIN) 200 MG tablet Take 400 mg by mouth every 6 (six) hours as needed for moderate pain.     Historical Provider, MD  morphine (MS CONTIN) 30 MG 12 hr tablet Take 1 tablet (30 mg total)  by mouth every 12 (twelve) hours. 10/25/15   Henrietta Hoover, NP  oxyCODONE-acetaminophen (PERCOCET) 10-325 MG tablet Take 1 tablet by mouth every 4 (four) hours as needed for pain. 11/01/15   Quentin Angst, MD  Topiramate ER 100 MG CP24 Take 100 mg by mouth at bedtime.    Historical Provider, MD    Family History Family History  Problem Relation Age of Onset  . Sickle cell trait Father   . Sickle cell trait Mother   . Sickle cell anemia Other     Social History Social History  Substance Use Topics  . Smoking status: Current Some Day Smoker  . Smokeless tobacco: Never Used     Comment: 08/21/15 - only smokes occasionally  . Alcohol use No       Allergies   Ultram [tramadol]; Zofran [ondansetron hcl]; Buprenorphine hcl; Morphine and related; and Tape   Review of Systems Review of Systems  Respiratory: Negative for cough.   Gastrointestinal: Negative for vomiting.  All other systems reviewed and are negative.    Physical Exam Updated Vital Signs BP 102/64   Pulse 94   Temp 99.5 F (37.5 C) (Oral)   Resp 18   Ht 5' (1.524 m)   Wt 125 lb (56.7 kg)   SpO2 99%   BMI 24.41 kg/m   Physical Exam  Constitutional: She is oriented to person, place, and time. She appears well-developed and well-nourished. No distress.  HENT:  Head: Normocephalic.  Eyes: Conjunctivae are normal.  Neck: Neck supple. No tracheal deviation present.  Cardiovascular: Normal rate, regular rhythm and normal heart sounds.   Pulmonary/Chest: Effort normal and breath sounds normal. No respiratory distress. She exhibits no tenderness.  Abdominal: Soft. She exhibits no distension.  Neurological: She is alert and oriented to person, place, and time.  Skin: Skin is warm and dry.  Psychiatric: She has a normal mood and affect.  Vitals reviewed.    ED Treatments / Results  Labs (all labs ordered are listed, but only abnormal results are displayed) Labs Reviewed  COMPREHENSIVE METABOLIC PANEL  CBC WITH DIFFERENTIAL/PLATELET    EKG  EKG Interpretation None       Radiology No results found.  Procedures Procedures (including critical care time)  Medications Ordered in ED Medications  diphenhydrAMINE (BENADRYL) capsule 25 mg (25 mg Oral Refused 11/04/15 1930)  topiramate (TOPAMAX) tablet 100 mg (100 mg Oral Given 11/05/15 0006)  hydroxyurea (HYDREA) capsule 500 mg (not administered)  folic acid (FOLVITE) tablet 1 mg (not administered)  DULoxetine (CYMBALTA) DR capsule 60 mg (not administered)  morphine (MS CONTIN) 12 hr tablet 30 mg (30 mg Oral Given 11/05/15 0006)  senna-docusate (Senokot-S) tablet 1 tablet (1 tablet Oral Not Given  11/05/15 0006)  polyethylene glycol (MIRALAX / GLYCOLAX) packet 17 g (not administered)  naloxone (NARCAN) injection 0.4 mg (not administered)    And  sodium chloride flush (NS) 0.9 % injection 9 mL (not administered)  diphenhydrAMINE (BENADRYL) injection 12.5 mg (not administered)    Or  diphenhydrAMINE (BENADRYL) 12.5 MG/5ML elixir 12.5 mg (not administered)  sodium chloride flush (NS) 0.9 % injection 3 mL (not administered)  enoxaparin (LOVENOX) injection 40 mg (40 mg Subcutaneous Not Given 11/05/15 0007)  ketorolac (TORADOL) 30 MG/ML injection 30 mg (30 mg Intravenous Given 11/05/15 0006)  HYDROmorphone (DILAUDID) 1 mg/mL PCA injection (1 mg Intravenous Set-up / Initial Syringe 11/05/15 0001)  famotidine (PEPCID) tablet 20 mg (20 mg Oral Not Given 11/05/15 0007)  0.45 % NaCl  with KCl 20 mEq / L infusion (not administered)  acetaminophen (TYLENOL) tablet 650 mg (not administered)  ciprofloxacin (CIPRO) IVPB 400 mg (not administered)  sodium chloride flush (NS) 0.9 % injection 10-40 mL (not administered)  dextrose 5 % bolus 1,000 mL (0 mLs Intravenous Stopped 11/04/15 2028)  HYDROmorphone (DILAUDID) injection 1.4 mg (1.4 mg Intravenous Given 11/04/15 1745)    Or  HYDROmorphone (DILAUDID) injection 1.4 mg ( Subcutaneous See Alternative 11/04/15 1745)  HYDROmorphone (DILAUDID) injection 1.4 mg (1.4 mg Intravenous Given 11/04/15 1930)    Or  HYDROmorphone (DILAUDID) injection 1.4 mg ( Subcutaneous See Alternative 11/04/15 1930)  ketorolac (TORADOL) 30 MG/ML injection 30 mg (30 mg Intravenous Given 11/04/15 2022)  HYDROmorphone (DILAUDID) injection 2.1 mg (2.1 mg Intravenous Given 11/04/15 2023)    Or  HYDROmorphone (DILAUDID) injection 2.1 mg ( Subcutaneous See Alternative 11/04/15 2023)  potassium chloride SA (K-DUR,KLOR-CON) CR tablet 40 mEq (40 mEq Oral Given 11/04/15 2213)  diphenhydrAMINE (BENADRYL) 25 mg in sodium chloride 0.9 % 50 mL IVPB (25 mg Intravenous Given 11/04/15 2357)  HYDROmorphone (DILAUDID)  injection 1 mg (1 mg Intravenous Given 11/04/15 2303)  promethazine (PHENERGAN) 25 mg in sodium chloride 0.9 % 1,000 mL infusion (25 mg Intravenous New Bag/Given 11/04/15 2356)     Initial Impression / Assessment and Plan / ED Course  I have reviewed the triage vital signs and the nursing notes.  Pertinent labs & imaging results that were available during my care of the patient were reviewed by me and considered in my medical decision making (see chart for details).  Clinical Course    Patient presents with pain typical of sickle cell pain crisis starting yesterday over left chest wall. Home pain meds not working effectively. No shortness of breath, no fevers, no signs of acute chest on plain film or clinically and Pt otherwise in NAD objectively other than complaint of pain. CBC, Retic count to assess for aplastic crisis. Given IVF and pain medicine with plan to admit vs discharge based on response and results of testing. Pt has had 2 CT chests performed for PE in last 2 months and 6 in last 3 years in our system which are all negative. Clinical suspicion is low and Pt is not hypoxemic, short of breath, tachycardic or otherwise in distress. Unable to control pain after 3 doses of dilaudid. Hospitalist was consulted for pain control admission and will see the patient in the emergency department.   Final Clinical Impressions(s) / ED Diagnoses   Final diagnoses:  Sickle cell anemia with pain [facility])    New Prescriptions New Prescriptions   No medications on file     Lyndal Pulley, MD 11/05/15 0131

## 2015-11-04 NOTE — H&P (Signed)
History and Physical    Brittany Foster VWU:[id number] DOB: 12/24/1985 DOA: 11/04/2015  PCP: Jeanann Lewandowsky, MD   Patient coming from: Home.  Chief Complaint: Sickle cell pain crisis.  HPI: Brittany Foster is a 30 y.o. female with medical history significant of sickle cell anemia, depression, migraine headache, chronic pain syndrome, neuropathy who comes to the ER with complaints of sickle cell pain crisis.  Per patient, she woke up around 2-3 in the morning Sunday with body aches, chills, nausea, headache, mild dyspnea, left lower rib cage pain which have persisted through today despite she using her MS Contin, oxycodone and trying to stay hydrated. She denies fever, sore throat, cough, palpitations, dizziness, diaphoresis, pitting edema of the lower extremities or orthopnea. She complains of one episode of emesis yesterday, mild diarrhea, but denies melena, hematochezia, dysuria, frequency or hematuria.  ED Course: The patient received IV fluids, analgesics, antihistamine and antiemetics reporting relief of her symptoms. Workup shows a hemoglobin level of 8.6 g/dL, leukocytosis of 29.5 K, reticulocyte count of 10.6%, potassium of 2.9 mmol per liter, bilirubin 1.6 mg/dL.  Review of Systems: As per HPI otherwise 10 point review of systems negative.   Past Medical History:  Diagnosis Date  . Anemia   . Depression, major, recurrent (HCC)   . Migraines   . Sickle cell anemia (HCC)     Past Surgical History:  Procedure Laterality Date  . CESAREAN SECTION    . CHOLECYSTECTOMY  2000  . IR GENERIC HISTORICAL  10/08/2015   IR US GUIDE VASC ACCESS RIGHT 10/08/2015 Simonne Come, MD [facility]  . IR GENERIC HISTORICAL  10/08/2015   IR FLUORO GUIDE CV LINE RIGHT 10/08/2015 Simonne Come, MD [facility]  . MULTIPLE TOOTH EXTRACTIONS N/A   . port a cath placement Right    about 6-7 years ago  . removal of porta cath Right 09/11/15  . TUBAL LIGATION       reports that she has been smoking.  She  has never used smokeless tobacco. She reports that she does not drink alcohol or use drugs.  Allergies  Allergen Reactions  . Ultram [Tramadol] Other (See Comments)    seizures  . Zofran [Ondansetron Hcl] Nausea And Vomiting  . Buprenorphine Hcl Hives and Rash    Shaking Tolerates Percocet, Norco, and buprenorphine  . Morphine And Related Hives, Rash and Other (See Comments)    Shaking Tolerates Percocet, Norco, Dilaudid, and buprenorphine  . Tape Rash    Family History  Problem Relation Age of Onset  . Sickle cell trait Father   . Sickle cell trait Mother   . Sickle cell anemia Other     Prior to Admission medications   Medication Sig Start Date End Date Taking? Authorizing Provider  DULoxetine (CYMBALTA) 60 MG capsule Take 1 capsule (60 mg total) by mouth daily. 08/08/15  Yes [name], FNP  folic acid (FOLVITE) 1 MG tablet Take 1 tablet (1 mg total) by mouth daily. 08/08/15  Yes [name], FNP  ibuprofen (ADVIL,MOTRIN) 200 MG tablet Take 400 mg by mouth every 6 (six) hours as needed for moderate pain.    Yes Historical Provider, MD  oxyCODONE-acetaminophen (PERCOCET) 10-325 MG tablet Take 1 tablet by mouth every 4 (four) hours as needed for pain. 11/01/15  Yes Quentin Angst, MD  hydroxyurea (HYDREA) 500 MG capsule TAKE 2 CAPSULES BY MOUTH DAILY. MAY TAKE WITH FOOD TO MINIMIZE GI SIDE EFFECTS. Patient taking differently: Take 500 mg by mouth  daily. MAY TAKE WITH FOOD TO MINIMIZE GI SIDE EFFECTS. 08/08/15   Massie MaroonLachina M [name], FNP  morphine (MS CONTIN) 30 MG 12 hr tablet Take 1 tablet (30 mg total) by mouth every 12 (twelve) hours. 10/25/15   Henrietta HooverLinda C Bernhardt, NP  Topiramate ER 100 MG CP24 Take 100 mg by mouth at bedtime.    Historical Provider, MD    Physical Exam: Vitals:   11/04/15 1656 11/04/15 1700 11/04/15 1836 11/04/15 2021  BP:  102/64 102/65 110/73  Pulse:  94 94 84  Resp:  18 21 18   Temp:      TempSrc:      SpO2:  99% 100% 100%  Weight: 56.7 kg (125  lb)     Height: 5' (1.524 m)         Constitutional: NAD, calm, comfortable Vitals:   11/04/15 1656 11/04/15 1700 11/04/15 1836 11/04/15 2021  BP:  102/64 102/65 110/73  Pulse:  94 94 84  Resp:  18 21 18   Temp:      TempSrc:      SpO2:  99% 100% 100%  Weight: 56.7 kg (125 lb)     Height: 5' (1.524 m)      Eyes: PERRL, lids and conjunctivae normal ENMT: Mucous membranes are moist. Posterior pharynx clear of any exudate or lesions.Normal dentition.  Neck: normal, supple, no masses, no thyromegaly Respiratory: clear to auscultation bilaterally, no wheezing, no crackles. Normal respiratory effort. No accessory muscle use. Chest shows right Port-A-Cath. Cardiovascular: Regular rate and rhythm, no murmurs / rubs / gallops. No extremity edema. 2+ pedal pulses. No carotid bruits.  Abdomen: no tenderness, no masses palpated. No hepatosplenomegaly. Bowel sounds positive.  Musculoskeletal: no clubbing / cyanosis. No joint deformity upper and lower extremities. Good ROM, no contractures. Normal muscle tone.  Skin: no rashes, lesions, ulcers. No induration Neurologic: CN 2-12 grossly intact. Sensation intact, DTR normal. Strength 5/5 in all 4.  Psychiatric: Normal judgment and insight. Alert and oriented x 3. Normal mood.     Labs on Admission: I have personally reviewed following labs and imaging studies  CBC:  Recent Labs Lab 11/01/15 1125 11/04/15 1700  WBC 12.3* 12.0*  NEUTROABS 7.8* 8.2*  HGB 7.9* 8.6*  HCT 22.9* 24.8*  MCV 100.4* 99.6  PLT 352 368   Basic Metabolic Panel:  Recent Labs Lab 11/04/15 1700  NA 140  K 2.9*  CL 112*  CO2 21*  GLUCOSE 123*  BUN 6  CREATININE 0.55  CALCIUM 9.0   GFR: Estimated Creatinine Clearance: 80.4 mL/min (by C-G formula based on SCr of 0.8 mg/dL). Liver Function Tests:  Recent Labs Lab 11/04/15 1700  AST 36  ALT 23  ALKPHOS 69  BILITOT 1.6*  PROT 7.5  ALBUMIN 4.3   Cardiac Enzymes:  Recent Labs Lab 11/04/15 1700    TROPONINI <0.03    Recent Labs  11/04/15 1700  RETICCTPCT 10.6*   Urine analysis:    Component Value Date/Time   COLORURINE AMBER (A) 11/01/2015 1055   APPEARANCEUR CLOUDY (A) 11/01/2015 1055   LABSPEC 1.012 11/01/2015 1055   PHURINE 6.0 11/01/2015 1055   GLUCOSEU NEGATIVE 11/01/2015 1055   HGBUR TRACE (A) 11/01/2015 1055   BILIRUBINUR NEGATIVE 11/01/2015 1055   KETONESUR NEGATIVE 11/01/2015 1055   PROTEINUR 30 (A) 11/01/2015 1055   UROBILINOGEN 1.0 11/13/2014 1338   NITRITE POSITIVE (A) 11/01/2015 1055   LEUKOCYTESUR MODERATE (A) 11/01/2015 1055    Radiological Exams on Admission: Dg Chest 2 View  Result  Date: 11/04/2015 CLINICAL DATA:  30 y/o female with sickle cell crisis onset today, c/o LEFT CP and leg pain. Hx heart murmur, MI, PE, former smoker. EXAM: CHEST  2 VIEW COMPARISON:  10/19/2015 FINDINGS: Right central line tip overlies the level of superior vena cava. Heart size is normal. Lungs are clear. Visualized osseous structures have a normal appearance. Surgical clips are noted in the right upper quadrant the abdomen. IMPRESSION: No active cardiopulmonary disease. Electronically Signed   By: Norva Pavlov M.D.   On: 11/04/2015 18:10  Echocardiogram 03/19/2014  LV EF: 55% -  60%  ------------------------------------------------------------------- Indications:   Pericardial effusion 423.9.  ------------------------------------------------------------------- History:  PMH: Sickle cell pain crisis. Angina pectoris.  ------------------------------------------------------------------- Study Conclusions  - Left ventricle: The cavity size was normal. Wall thickness was normal. Systolic function was normal. The estimated ejection fraction was in the range of 55% to 60%. Wall motion was normal; there were no regional wall motion abnormalities. Left ventricular diastolic function parameters were normal. - Pericardium, extracardiac: A trivial pericardial  effusion was identified posterior to the heart.  EKG: Independently reviewed. Vent. rate 95 BPM PR interval * ms QRS duration 84 ms QT/QTc 381/479 ms P-R-T axes 15 68 54 Sinus rhythm RSR' in V1 or V2, right VCD or RVH Borderline prolonged QT interval No significant change since last tracing  Assessment/Plan Principal Problem:   Sickle cell pain crisis (HCC) Admit to telemetry/inpatient. Continue supplemental oxygen via nasal cannula. Continue IV hydration. Continue MS Contin 30 mg by mouth twice a day. Continue analgesics as needed. Continue antiemetics as needed. Continue antihistamines as needed. Follow-up CBC and reticulocyte count in the morning.  Active Problems:   Chest pain Chest pain is atypical. Telemetry monitoring. Trend troponin levels.    Depression Continue Cymbalta 60 mg by mouth daily.    Hypokalemia Replacing through IVF. Follow-up potassium level in a.m.    Chronic pain syndrome Continue MS Contin 30 mg by mouth twice a day. Hold oral  Oxycodone PCA  as needed for pain control.      Leukocytosis No symptoms or signs of infection. Abnormal urinalysis. Start ciprofloxacin 400 mg IVP every 12 hours Follow-up WBC in the morning.   DVT prophylaxis: Lovenox. Code Status: Full code. Family Communication:  Disposition Plan: Admit for pain management and IV hydration. Consults called:  Admission status: Inpatient/telemetry   Bobette Mo MD [facility] Pager [phone].  If 7PM-7AM, please contact night-coverage [email or web address] Password [facility]  11/04/2015, 9:54 PM

## 2015-11-04 NOTE — ED Triage Notes (Signed)
Pt reports left ribcage pain , bilateral hip and legs pain. Denies chest pain nor shortness of breath. Hx sickle cell.

## 2015-11-04 NOTE — ED Notes (Signed)
Bed: WA23 Expected date:  Expected time:  Means of arrival:  Comments: Pt in room 

## 2015-11-04 NOTE — ED Notes (Signed)
Patient transported to X-ray 

## 2015-11-05 DIAGNOSIS — E876 Hypokalemia: Secondary | ICD-10-CM

## 2015-11-05 DIAGNOSIS — G894 Chronic pain syndrome: Secondary | ICD-10-CM

## 2015-11-05 DIAGNOSIS — D5700 Hb-SS disease with crisis, unspecified: Principal | ICD-10-CM

## 2015-11-05 LAB — COMPREHENSIVE METABOLIC PANEL
ALK PHOS: 60 U/L (ref 38–126)
ALT: 18 U/L (ref 14–54)
ANION GAP: 3 — AB (ref 5–15)
AST: 28 U/L (ref 15–41)
Albumin: 3.5 g/dL (ref 3.5–5.0)
BILIRUBIN TOTAL: 2 mg/dL — AB (ref 0.3–1.2)
BUN: 5 mg/dL — ABNORMAL LOW (ref 6–20)
CALCIUM: 8.6 mg/dL — AB (ref 8.9–10.3)
CO2: 24 mmol/L (ref 22–32)
Chloride: 114 mmol/L — ABNORMAL HIGH (ref 101–111)
Creatinine, Ser: 0.37 mg/dL — ABNORMAL LOW (ref 0.44–1.00)
GLUCOSE: 148 mg/dL — AB (ref 65–99)
POTASSIUM: 3.3 mmol/L — AB (ref 3.5–5.1)
Sodium: 141 mmol/L (ref 135–145)
TOTAL PROTEIN: 6.4 g/dL — AB (ref 6.5–8.1)

## 2015-11-05 LAB — RETICULOCYTES
RBC.: 2.08 MIL/uL — AB (ref 3.87–5.11)
RETIC COUNT ABSOLUTE: 226.7 10*3/uL — AB (ref 19.0–186.0)
RETIC CT PCT: 10.9 % — AB (ref 0.4–3.1)

## 2015-11-05 LAB — CBC WITH DIFFERENTIAL/PLATELET
Basophils Absolute: 0 10*3/uL (ref 0.0–0.1)
Basophils Relative: 0 %
Eosinophils Absolute: 0.2 10*3/uL (ref 0.0–0.7)
Eosinophils Relative: 1 %
HEMATOCRIT: 20.9 % — AB (ref 36.0–46.0)
HEMOGLOBIN: 7.2 g/dL — AB (ref 12.0–15.0)
LYMPHS ABS: 5 10*3/uL — AB (ref 0.7–4.0)
Lymphocytes Relative: 43 %
MCH: 34.6 pg — AB (ref 26.0–34.0)
MCHC: 34.4 g/dL (ref 30.0–36.0)
MCV: 100.5 fL — AB (ref 78.0–100.0)
MONO ABS: 1.1 10*3/uL — AB (ref 0.1–1.0)
MONOS PCT: 10 %
NEUTROS ABS: 5.2 10*3/uL (ref 1.7–7.7)
NEUTROS PCT: 46 %
Platelets: 320 10*3/uL (ref 150–400)
RBC: 2.08 MIL/uL — ABNORMAL LOW (ref 3.87–5.11)
RDW: 17.8 % — AB (ref 11.5–15.5)
WBC: 11.5 10*3/uL — ABNORMAL HIGH (ref 4.0–10.5)

## 2015-11-05 LAB — TROPONIN I: Troponin I: <0.03 ng/mL (ref <0.03)

## 2015-11-05 LAB — MAGNESIUM: MAGNESIUM: 1.7 mg/dL (ref 1.7–2.4)

## 2015-11-05 MED ORDER — SODIUM CHLORIDE 0.9 % IV SOLN
12.5000 mg | Freq: Once | INTRAVENOUS | Status: AC
  Administered 2015-11-05: 12.5 mg via INTRAVENOUS
  Filled 2015-11-05: qty 0.25

## 2015-11-05 MED ORDER — SODIUM CHLORIDE 0.9% FLUSH
10.0000 mL | INTRAVENOUS | Status: DC | PRN
  Administered 2015-11-06 – 2015-11-11 (×3): 10 mL
  Filled 2015-11-05 (×3): qty 40

## 2015-11-05 MED ORDER — HYDROMORPHONE 1 MG/ML IV SOLN
INTRAVENOUS | Status: DC
  Administered 2015-11-05: 3 mg via INTRAVENOUS
  Administered 2015-11-05: 6.3 mg via INTRAVENOUS
  Administered 2015-11-05: 20:00:00 via INTRAVENOUS
  Administered 2015-11-06: 12 mg via INTRAVENOUS
  Administered 2015-11-06: 8 mg via INTRAVENOUS
  Administered 2015-11-06: 9.5 mg via INTRAVENOUS
  Administered 2015-11-06: 2.5 mg via INTRAVENOUS
  Administered 2015-11-06 (×2): via INTRAVENOUS
  Administered 2015-11-06: 11 mg via INTRAVENOUS
  Administered 2015-11-07: 03:00:00 via INTRAVENOUS
  Administered 2015-11-07: 12.5 mg via INTRAVENOUS
  Administered 2015-11-07: 15.5 mg via INTRAVENOUS
  Administered 2015-11-07: 14:00:00 via INTRAVENOUS
  Filled 2015-11-05 (×5): qty 25

## 2015-11-05 MED ORDER — POTASSIUM CHLORIDE CRYS ER 20 MEQ PO TBCR
40.0000 meq | EXTENDED_RELEASE_TABLET | ORAL | Status: AC
  Filled 2015-11-05 (×2): qty 2

## 2015-11-05 MED ORDER — NALOXONE HCL 0.4 MG/ML IJ SOLN: 0.4000 mg | INTRAMUSCULAR | Status: DC | PRN

## 2015-11-05 MED ORDER — PROMETHAZINE HCL 25 MG PO TABS
25.0000 mg | ORAL_TABLET | Freq: Four times a day (QID) | ORAL | Status: DC | PRN
  Administered 2015-11-05: 25 mg via ORAL
  Filled 2015-11-05: qty 1

## 2015-11-05 MED ORDER — SODIUM CHLORIDE 0.9% FLUSH: 9.0000 mL | INTRAVENOUS | Status: DC | PRN

## 2015-11-05 MED ORDER — DIPHENHYDRAMINE HCL 25 MG PO CAPS
25.0000 mg | ORAL_CAPSULE | Freq: Four times a day (QID) | ORAL | Status: DC | PRN
  Filled 2015-11-05: qty 2

## 2015-11-05 MED ORDER — HYDROMORPHONE HCL 2 MG/ML IJ SOLN
2.0000 mg | INTRAMUSCULAR | Status: AC
  Administered 2015-11-05 – 2015-11-06 (×6): 2 mg via INTRAVENOUS
  Filled 2015-11-05 (×3): qty 1

## 2015-11-05 MED ORDER — HYDROMORPHONE HCL 2 MG/ML IJ SOLN
2.0000 mg | INTRAMUSCULAR | Status: DC | PRN
  Administered 2015-11-06 – 2015-11-07 (×9): 2 mg via INTRAVENOUS

## 2015-11-05 MED ORDER — CIPROFLOXACIN IN D5W 400 MG/200ML IV SOLN
400.0000 mg | Freq: Two times a day (BID) | INTRAVENOUS | Status: DC
  Administered 2015-11-05 – 2015-11-06 (×5): 400 mg via INTRAVENOUS
  Filled 2015-11-05 (×4): qty 200

## 2015-11-05 MED ORDER — PROMETHAZINE HCL 25 MG RE SUPP: 25.0000 mg | Freq: Four times a day (QID) | RECTAL | Status: DC | PRN

## 2015-11-05 NOTE — Progress Notes (Addendum)
SICKLE CELL SERVICE PROGRESS NOTE  Brittany Foster VJK:[id number] DOB: [birth year] DOA: 11/04/2015 PCP: Angelica Chessman, MD  Assessment/Plan: Principal Problem:   Sickle cell pain crisis ([place]) Active Problems:   Neuropathy ([place])   Depression   Hypokalemia   Chronic pain syndrome   Chest pain   Leukocytosis  1. Hb SS with crisis: Pt is opiate tolerant and was placed on a full dose PCA on admission which delivers less MME that she takes at home on a daily basis. I have adjusted her PCA to a bolus of 0.5 and 1 hour limit of 3 mg. Continue Toradol and IVF.  Will re-assess pain tomorrow.  2. Leukocytosis: Pt has a mild leukocytosis and no evidence of infection. Will continue to monitor.  3. Hypokalemia: Pt was prescribed potassium but refused earlier today due to nausea. She is willing to take  At this tome. Nurse notified.  4. Anemia of chronic disease: Hb stable at baseline.  5. Chronic pain: Pt has known chronic pain and is on MS Contin. Continue 6. Medication non-adherence: Pt has been prescribed Hydrea 1000 mg daily but takes only 500 mg which is sub-therapeutic.   Code Status: Full Code Family Communication: N/A Disposition Plan: Not yet ready for discharge  Brittany Foster.  Pager [phone]. If 7PM-7AM, please contact night-coverage.  11/05/2015, 6:31 PM  LOS: 1 day   Interim History: Pt reports pain in hips, back and arms at a level of 9/10.   Consultants:  None  Procedures:  None  Antibiotics:  None   Objective: Vitals:   11/05/15 0927 11/05/15 1115 11/05/15 1400 11/05/15 1534  BP: (!) 89/57 (!) 98/58 (!) 92/53   Pulse: 64     Resp: '18 18 18 12  '$ Temp: 98.3 F (36.8 C)  98 F (36.7 C)   TempSrc: Oral  Oral   SpO2: 99% 97% 99% 98%  Weight:      Height:       Weight change:   Intake/Output Summary (Last 24 hours) at 11/05/15 1831 Last data filed at 11/05/15 1600  Gross per 24 hour  Intake             1915 ml  Output                0 ml  Net              1915 ml    General: Alert, awake, oriented x3, in no apparent distress.  HEENT: [place]/AT PEERL, EOMI, anicteric Neck: Trachea midline,  no masses, no thyromegal,y no JVD, no carotid bruit OROPHARYNX:  Moist, No exudate/ erythema/lesions.  Heart: Regular rate and rhythm, without murmurs, rubs, gallops, PMI non-displaced, no heaves or thrills on palpation.  Lungs: Clear to auscultation, no wheezing or rhonchi noted. No increased vocal fremitus resonant to percussion  Abdomen: Soft, nontender, nondistended, positive bowel sounds, no masses no hepatosplenomegaly noted.  Neuro: No focal neurological deficits noted cranial nerves II through XII grossly intact. Strength at functional baseline in bilateral upper and lower extremities. Musculoskeletal: No warm swelling or erythema around joints, no spinal tenderness noted. Psychiatric: Patient alert and oriented x3, good cognition, good recent to remote recall.    Data Reviewed: Basic Metabolic Panel:  Recent Labs Lab 11/04/15 1700 11/05/15 0415  NA 140 141  K 2.9* 3.3*  CL 112* 114*  CO2 21* 24  GLUCOSE 123* 148*  BUN 6 5*  CREATININE 0.55 0.37*  CALCIUM 9.0 8.6*  MG  --  1.7  Liver Function Tests:  Recent Labs Lab 11/04/15 1700 11/05/15 0415  AST 36 28  ALT 23 18  ALKPHOS 69 60  BILITOT 1.6* 2.0*  PROT 7.5 6.4*  ALBUMIN 4.3 3.5   No results for input(s): LIPASE, AMYLASE in the last 168 hours. No results for input(s): AMMONIA in the last 168 hours. CBC:  Recent Labs Lab 11/01/15 1125 11/04/15 1700 11/05/15 0415  WBC 12.3* 12.0* 11.5*  NEUTROABS 7.8* 8.2* 5.2  HGB 7.9* 8.6* 7.2*  HCT 22.9* 24.8* 20.9*  MCV 100.4* 99.6 100.5*  PLT 352 368 320   Cardiac Enzymes:  Recent Labs Lab 11/04/15 1700 11/05/15 0135 11/05/15 0415  TROPONINI <0.03 <0.03 <0.03   BNP (last 3 results) No results for input(s): BNP in the last 8760 hours.  ProBNP (last 3 results) No results for input(s): PROBNP in the last 8760  hours.  CBG: No results for input(s): GLUCAP in the last 168 hours.  No results found for this or any previous visit (from the past 240 hour(s)).   Studies: Dg Chest 2 View  Result Date: 11/04/2015 CLINICAL DATA:  30 y/o female with sickle cell crisis onset today, c/o LEFT CP and leg pain. Hx heart murmur, MI, PE, former smoker. EXAM: CHEST  2 VIEW COMPARISON:  10/19/2015 FINDINGS: Right central line tip overlies the level of superior vena cava. Heart size is normal. Lungs are clear. Visualized osseous structures have a normal appearance. Surgical clips are noted in the right upper quadrant the abdomen. IMPRESSION: No active cardiopulmonary disease. Electronically Signed   By: Nolon Nations M.D.   On: 11/04/2015 18:10   Dg Chest 2 View  Result Date: 10/19/2015 CLINICAL DATA:  Pt c/o central chest pain radiating to left side onset x approx. 4 hours ago. Also c/o nausea and fever. H/o sickle cell and heart attack x 15 years ago. EXAM: CHEST  2 VIEW COMPARISON:  10/08/2015 FINDINGS: Cardiac silhouette is top-normal in size. No mediastinal or hilar masses or evidence of adenopathy. Lungs are clear.  No pleural effusion or pneumothorax. Right internal jugular central venous line catheter tip projects in the lower superior vena cava near the caval atrial junction. Bony thorax is unremarkable. IMPRESSION: No active cardiopulmonary disease. Electronically Signed   By: Lajean Manes M.D.   On: 10/19/2015 13:56   Dg Chest 2 View  Result Date: 10/08/2015 CLINICAL DATA:  Sickle cell disease and complains of chest pain. Mild shortness of breath. EXAM: CHEST  2 VIEW COMPARISON:  09/02/2015 FINDINGS: Right subclavian Port-A-Cath has been removed and the patient now has a right jugular central line with the tip near the superior cavoatrial junction. There is a chronic catheter fragment in the left innominate vein that is poorly characterized on this examination. Both lungs are clear without pulmonary edema or  airspace disease. Heart and mediastinum are within normal limits. The trachea is midline. No pleural effusions. No acute bone abnormality. IMPRESSION: No active cardiopulmonary disease. Central line tip at the superior cavoatrial junction. Electronically Signed   By: Markus Daft M.D.   On: 10/08/2015 16:53   Ct Angio Chest Pe W And/or Wo Contrast  Result Date: 10/19/2015 CLINICAL DATA:  Chest pain and shortness of breath. Fever. History sickle cell anemia. EXAM: CT ANGIOGRAPHY CHEST WITH CONTRAST TECHNIQUE: Multidetector CT imaging of the chest was performed using the standard protocol during bolus administration of intravenous contrast. Multiplanar CT image reconstructions and MIPs were obtained to evaluate the vascular anatomy. CONTRAST:  100 cc of Isovue  370 COMPARISON:  Chest radiograph of earlier today. Most recent CT 09/02/2015. FINDINGS: Cardiovascular: The quality of this exam for evaluation of pulmonary embolism is excellent. No evidence of pulmonary embolism. Normal aortic caliber without dissection. Heart size upper normal. Trace pericardial fluid is likely physiologic. Catheter fragment again identified within the left brachiocephalic vein. A right-sided PICC line terminates at the low SVC. Mediastinum/Nodes: No mediastinal or hilar adenopathy. Minimal residual thymus in the anterior mediastinum. Lungs/Pleura: No pleural fluid.  Clear lungs. Upper Abdomen: Normal imaged portions of the liver, adrenal glands, kidneys. Hyperattenuation throughout the spleen is likely related to sickle cell anemia and prior infarcts. Musculoskeletal: Mildly increased density within the T7 and T9 vertebral bodies may relate to sickle cell anemia induced infarcts. This is similar. Review of the MIP images confirms the above findings. IMPRESSION: 1.  No evidence of pulmonary embolism. 2.  No acute process in the chest. 3. Catheter fragment in the left brachiocephalic vein, unchanged including back to 2015. Electronically  Signed   By: Jeronimo Greaves M.D.   On: 10/19/2015 17:02   Ir Fluoro Guide Cv Line Right  Result Date: 10/08/2015 INDICATION: History of sickle cell disease, in need of durable intravenous access for frequent blood draws and medication administration. Note, patient has history of prior Port a catheter placement however recently had Port a catheter removed secondary to bacteremia. As such, request made made for placement of a tunneled central venous catheter in lieu of placement of a formal Port a Catheter. EXAM: TUNNELED PICC LINE WITH ULTRASOUND AND FLUOROSCOPIC GUIDANCE MEDICATIONS: None ANESTHESIA/SEDATION: None COMPLICATIONS: None immediate. PROCEDURE: Informed written consent was obtained from the patient after a discussion of the risks, benefits, and alternatives to treatment. Questions regarding the procedure were encouraged and answered. The right neck and chest were prepped with chlorhexidine in a sterile fashion, and a sterile drape was applied covering the operative field. Maximum barrier sterile technique with sterile gowns and gloves were used for the procedure. A timeout was performed prior to the initiation of the procedure. After creating a small venotomy incision, a micropuncture kit was utilized to access the right internal jugular vein under direct, real-time ultrasound guidance after the overlying soft tissues were anesthetized with 1% lidocaine with epinephrine. Ultrasound image documentation was performed. The microwire was kinked to measure appropriate catheter length. The micropuncture sheath was exchanged for a peel-away sheath over a guidewire. A 5 French single lumen tunneled PICC measuring 20 cm was tunneled in a retrograde fashion from the anterior chest wall to the venotomy incision. The catheter was then placed through the peel-away sheath with tip ultimately positioned at the superior caval-atrial junction. Final catheter positioning was confirmed and documented with a spot  radiographic image. The catheter aspirates and flushes normally. The catheter was flushed with appropriate volume heparin dwells. The catheter exit site was secured with a 0-Prolene retention suture. The venotomy incision was closed with an interrupted 4-0 Vicryl, Dermabond and Steri-strips. Dressings were applied. The patient tolerated the procedure well without immediate post procedural complication. FINDINGS: After catheter placement, the tip lies within the superior cavoatrial junction. The catheter aspirates and flushes normally and is ready for immediate use. Re- demonstrated abandoned catheter tubing overlying the expected location of the central aspect of the left innominate vein. IMPRESSION: Successful placement of 20 cm single lumen tunneled PICC catheter via the right internal jugular vein with tip terminating at the superior caval atrial junction. The catheter is ready for immediate use. Electronically Signed   By:  Sandi Mariscal M.D.   On: 10/08/2015 17:04   Ir US Guide Vasc Access Right  Result Date: 10/08/2015 INDICATION: History of sickle cell disease, in need of durable intravenous access for frequent blood draws and medication administration. Note, patient has history of prior Port a catheter placement however recently had Port a catheter removed secondary to bacteremia. As such, request made made for placement of a tunneled central venous catheter in lieu of placement of a formal Port a Catheter. EXAM: TUNNELED PICC LINE WITH ULTRASOUND AND FLUOROSCOPIC GUIDANCE MEDICATIONS: None ANESTHESIA/SEDATION: None COMPLICATIONS: None immediate. PROCEDURE: Informed written consent was obtained from the patient after a discussion of the risks, benefits, and alternatives to treatment. Questions regarding the procedure were encouraged and answered. The right neck and chest were prepped with chlorhexidine in a sterile fashion, and a sterile drape was applied covering the operative field. Maximum barrier sterile  technique with sterile gowns and gloves were used for the procedure. A timeout was performed prior to the initiation of the procedure. After creating a small venotomy incision, a micropuncture kit was utilized to access the right internal jugular vein under direct, real-time ultrasound guidance after the overlying soft tissues were anesthetized with 1% lidocaine with epinephrine. Ultrasound image documentation was performed. The microwire was kinked to measure appropriate catheter length. The micropuncture sheath was exchanged for a peel-away sheath over a guidewire. A 5 French single lumen tunneled PICC measuring 20 cm was tunneled in a retrograde fashion from the anterior chest wall to the venotomy incision. The catheter was then placed through the peel-away sheath with tip ultimately positioned at the superior caval-atrial junction. Final catheter positioning was confirmed and documented with a spot radiographic image. The catheter aspirates and flushes normally. The catheter was flushed with appropriate volume heparin dwells. The catheter exit site was secured with a 0-Prolene retention suture. The venotomy incision was closed with an interrupted 4-0 Vicryl, Dermabond and Steri-strips. Dressings were applied. The patient tolerated the procedure well without immediate post procedural complication. FINDINGS: After catheter placement, the tip lies within the superior cavoatrial junction. The catheter aspirates and flushes normally and is ready for immediate use. Re- demonstrated abandoned catheter tubing overlying the expected location of the central aspect of the left innominate vein. IMPRESSION: Successful placement of 20 cm single lumen tunneled PICC catheter via the right internal jugular vein with tip terminating at the superior caval atrial junction. The catheter is ready for immediate use. Electronically Signed   By: Sandi Mariscal M.D.   On: 10/08/2015 17:04    Scheduled Meds: . ciprofloxacin  400 mg  Intravenous BID  . DULoxetine  60 mg Oral Daily  . enoxaparin (LOVENOX) injection  40 mg Subcutaneous QHS  . famotidine  20 mg Oral BID  . folic acid  1 mg Oral Daily  . HYDROmorphone   Intravenous Q4H  . hydroxyurea  500 mg Oral Daily  . ketorolac  30 mg Intravenous Q6H  . morphine  30 mg Oral Q12H  . senna-docusate  1 tablet Oral BID  . sodium chloride flush  3 mL Intravenous Q12H  . topiramate  100 mg Oral QHS   Continuous Infusions: . 0.45 % NaCl with KCl 20 mEq / L 100 mL/hr at 11/05/15 1407    Principal Problem:   Sickle cell pain crisis (HCC) Active Problems:   Neuropathy (HCC)   Depression   Hypokalemia   Chronic pain syndrome   Chest pain   Leukocytosis    In excess of  25 minutes was spent during this visit. Greater than 50% involved face to face contact with the patient for assessment, counseling and coordination of care.

## 2015-11-05 NOTE — Progress Notes (Signed)
Consult entered for VAST. Indicated that "pts drsg was loose and was replaced by tegederm per floor nurse." per consult note. VAST nurse changed drsg per protocol. Tomasita MorrowHeather Scott Vanderveer, RN VAST

## 2015-11-06 LAB — CBC WITH DIFFERENTIAL/PLATELET
Basophils Absolute: 0.1 10*3/uL (ref 0.0–0.1)
Basophils Relative: 0 %
EOS PCT: 3 %
Eosinophils Absolute: 0.4 10*3/uL (ref 0.0–0.7)
HEMATOCRIT: 20.7 % — AB (ref 36.0–46.0)
Hemoglobin: 7.2 g/dL — ABNORMAL LOW (ref 12.0–15.0)
LYMPHS ABS: 4.2 10*3/uL — AB (ref 0.7–4.0)
LYMPHS PCT: 33 %
MCH: 35.1 pg — AB (ref 26.0–34.0)
MCHC: 34.8 g/dL (ref 30.0–36.0)
MCV: 101 fL — AB (ref 78.0–100.0)
MONO ABS: 1 10*3/uL (ref 0.1–1.0)
MONOS PCT: 8 %
NEUTROS ABS: 7 10*3/uL (ref 1.7–7.7)
Neutrophils Relative %: 56 %
Platelets: 277 10*3/uL (ref 150–400)
RBC: 2.05 MIL/uL — ABNORMAL LOW (ref 3.87–5.11)
RDW: 17 % — AB (ref 11.5–15.5)
WBC: 12.7 10*3/uL — ABNORMAL HIGH (ref 4.0–10.5)

## 2015-11-06 LAB — COMPREHENSIVE METABOLIC PANEL
ALT: 20 U/L (ref 14–54)
ANION GAP: 5 (ref 5–15)
AST: 31 U/L (ref 15–41)
Albumin: 3.7 g/dL (ref 3.5–5.0)
Alkaline Phosphatase: 61 U/L (ref 38–126)
BILIRUBIN TOTAL: 1.9 mg/dL — AB (ref 0.3–1.2)
BUN: 7 mg/dL (ref 6–20)
CO2: 21 mmol/L — ABNORMAL LOW (ref 22–32)
Calcium: 8.8 mg/dL — ABNORMAL LOW (ref 8.9–10.3)
Chloride: 114 mmol/L — ABNORMAL HIGH (ref 101–111)
Creatinine, Ser: 0.48 mg/dL (ref 0.44–1.00)
Glucose, Bld: 137 mg/dL — ABNORMAL HIGH (ref 65–99)
POTASSIUM: 3.7 mmol/L (ref 3.5–5.1)
Sodium: 140 mmol/L (ref 135–145)
TOTAL PROTEIN: 6.3 g/dL — AB (ref 6.5–8.1)

## 2015-11-06 MED ORDER — DIPHENHYDRAMINE HCL 25 MG PO CAPS: 25.0000 mg | ORAL_CAPSULE | Freq: Four times a day (QID) | ORAL | Status: DC | PRN

## 2015-11-06 MED ORDER — DIPHENHYDRAMINE HCL 12.5 MG/5ML PO ELIX
25.0000 mg | ORAL_SOLUTION | Freq: Four times a day (QID) | ORAL | Status: DC | PRN
  Administered 2015-11-07: 25 mg via ORAL
  Filled 2015-11-06 (×2): qty 10

## 2015-11-06 NOTE — Progress Notes (Signed)
SICKLE CELL SERVICE PROGRESS NOTE  Brittany Foster XUX:[id number] DOB: 1985/10/05 DOA: 11/04/2015 PCP: Angelica Chessman, MD  Assessment/Plan: Principal Problem:   Sickle cell pain crisis ([place]) Active Problems:   Neuropathy ([place])   Depression   Hypokalemia   Chronic pain syndrome   Chest pain   Leukocytosis  1. Hb SS with crisis: Increase her PCA to a bolus to 0.6  and 1 hour limit of 3.6 mg. Continue clinician assisted doses on a PRN basis and continue Toradol and IVF.  Will re-assess pain tomorrow.  2. Leukocytosis: Pt has a mild leukocytosis and no evidence of infection. Will continue to monitor.  3. Hypokalemia:Resolved with replacement. 4. Anemia of chronic disease: Hb stable at baseline.  5. Chronic pain: Pt has known chronic pain and is on MS Contin. Continue 6. Medication non-adherence: Pt has been prescribed Hydrea 1000 mg daily but takes only 500 mg which is sub-therapeutic.   Code Status: Full Code Family Communication: N/A Disposition Plan: Not yet ready for discharge  [place].  Pager [phone]. If 7PM-7AM, please contact night-coverage.  11/06/2015, 1:46 PM  LOS: 2 days   Interim History: Pt reports pain in hips, back and ribs still at a level of 9/10.  She has used 42.8 mg with 80/77:demands/deliveries in the past 24 hours. Last BM yesterday.  Consultants:  None  Procedures:  None  Antibiotics:  None   Objective: Vitals:   11/06/15 0445 11/06/15 0743 11/06/15 1155 11/06/15 1339  BP: (!) 89/51   (!) 64/55  Pulse: 71   76  Resp: _0 Temp: 98.4 F (36.9 C)   98 F (36.7 C)  TempSrc: Oral   Oral  SpO2: 95% 97% 96% 100%  Weight:      Height:       Weight change:   Intake/Output Summary (Last 24 hours) at 11/06/15 1346 Last data filed at 11/06/15 0305  Gross per 24 hour  Intake          2999.33 ml  Output                0 ml  Net          2999.33 ml    General: Alert, awake, oriented x3, in no apparent distress.  HEENT:  [place]/AT PEERL, EOMI, anicteric Neck: Trachea midline,  no masses, no thyromegal,y no JVD, no carotid bruit OROPHARYNX:  Moist, No exudate/ erythema/lesions.  Heart: Regular rate and rhythm, without murmurs, rubs, gallops, PMI non-displaced, no heaves or thrills on palpation.  Lungs: Clear to auscultation, no wheezing or rhonchi noted. No increased vocal fremitus resonant to percussion  Abdomen: Soft, nontender, nondistended, positive bowel sounds, no masses no hepatosplenomegaly noted.  Neuro: No focal neurological deficits noted cranial nerves II through XII grossly intact. Strength at functional baseline in bilateral upper and lower extremities. Musculoskeletal: No warmth swelling or erythema around joints, no spinal tenderness noted. Psychiatric: Patient alert and oriented x3, good cognition, good recent to remote recall.    Data Reviewed: Basic Metabolic Panel:  Recent Labs Lab 11/04/15 1700 11/05/15 0415 11/06/15 0440  NA 140 141 140  K 2.9* 3.3* 3.7  CL 112* 114* 114*  CO2 21* 24 21*  GLUCOSE 123* 148* 137*  BUN 6 5* 7  CREATININE 0.55 0.37* 0.48  CALCIUM 9.0 8.6* 8.8*  MG  --  1.7  --    Liver Function Tests:  Recent Labs Lab 11/04/15 1700 11/05/15 0415 11/06/15 0440  AST 36 28 31  ALT _0 ALKPHOS 69 60 61  BILITOT 1.6* 2.0* 1.9*  PROT 7.5 6.4* 6.3*  ALBUMIN 4.3 3.5 3.7   No results for input(s): LIPASE, AMYLASE in the last 168 hours. No results for input(s): AMMONIA in the last 168 hours. CBC:  Recent Labs Lab 11/01/15 1125 11/04/15 1700 11/05/15 0415 11/06/15 0440  WBC 12.3* 12.0* 11.5* 12.7*  NEUTROABS 7.8* 8.2* 5.2 7.0  HGB 7.9* 8.6* 7.2* 7.2*  HCT 22.9* 24.8* 20.9* 20.7*  MCV 100.4* 99.6 100.5* 101.0*  PLT 352 368 320 277   Cardiac Enzymes:  Recent Labs Lab 11/04/15 1700 11/05/15 0135 11/05/15 0415  TROPONINI <0.03 <0.03 <0.03   BNP (last 3 results) No results for input(s): BNP in the last 8760 hours.  ProBNP (last 3  results) No results for input(s): PROBNP in the last 8760 hours.  CBG: No results for input(s): GLUCAP in the last 168 hours.  No results found for this or any previous visit (from the past 240 hour(s)).   Studies: Dg Chest 2 View  Result Date: 11/04/2015 CLINICAL DATA:  30 y/o female with sickle cell crisis onset today, c/o LEFT CP and leg pain. Hx heart murmur, MI, PE, former smoker. EXAM: CHEST  2 VIEW COMPARISON:  10/19/2015 FINDINGS: Right central line tip overlies the level of superior vena cava. Heart size is normal. Lungs are clear. Visualized osseous structures have a normal appearance. Surgical clips are noted in the right upper quadrant the abdomen. IMPRESSION: No active cardiopulmonary disease. Electronically Signed   By: Nolon Nations M.D.   On: 11/04/2015 18:10   Dg Chest 2 View  Result Date: 10/19/2015 CLINICAL DATA:  Pt c/o central chest pain radiating to left side onset x approx. 4 hours ago. Also c/o nausea and fever. H/o sickle cell and heart attack x 15 years ago. EXAM: CHEST  2 VIEW COMPARISON:  10/08/2015 FINDINGS: Cardiac silhouette is top-normal in size. No mediastinal or hilar masses or evidence of adenopathy. Lungs are clear.  No pleural effusion or pneumothorax. Right internal jugular central venous line catheter tip projects in the lower superior vena cava near the caval atrial junction. Bony thorax is unremarkable. IMPRESSION: No active cardiopulmonary disease. Electronically Signed   By: Lajean Manes M.D.   On: 10/19/2015 13:56   Dg Chest 2 View  Result Date: 10/08/2015 CLINICAL DATA:  Sickle cell disease and complains of chest pain. Mild shortness of breath. EXAM: CHEST  2 VIEW COMPARISON:  09/02/2015 FINDINGS: Right subclavian Port-A-Cath has been removed and the patient now has a right jugular central line with the tip near the superior cavoatrial junction. There is a chronic catheter fragment in the left innominate vein that is poorly characterized on this  examination. Both lungs are clear without pulmonary edema or airspace disease. Heart and mediastinum are within normal limits. The trachea is midline. No pleural effusions. No acute bone abnormality. IMPRESSION: No active cardiopulmonary disease. Central line tip at the superior cavoatrial junction. Electronically Signed   By: Markus Daft M.D.   On: 10/08/2015 16:53   Ct Angio Chest Pe W And/or Wo Contrast  Result Date: 10/19/2015 CLINICAL DATA:  Chest pain and shortness of breath. Fever. History sickle cell anemia. EXAM: CT ANGIOGRAPHY CHEST WITH CONTRAST TECHNIQUE: Multidetector CT imaging of the chest was performed using the standard protocol during bolus administration of intravenous contrast. Multiplanar CT image reconstructions and MIPs were obtained to evaluate the vascular anatomy. CONTRAST:  100 cc of Isovue 370 COMPARISON:  Chest radiograph of earlier today. Most recent CT 09/02/2015. FINDINGS: Cardiovascular: The quality of this exam for evaluation of pulmonary embolism is excellent. No evidence of pulmonary embolism. Normal aortic caliber without dissection. Heart size upper normal. Trace pericardial fluid is likely physiologic. Catheter fragment again identified within the left brachiocephalic vein. A right-sided PICC line terminates at the low SVC. Mediastinum/Nodes: No mediastinal or hilar adenopathy. Minimal residual thymus in the anterior mediastinum. Lungs/Pleura: No pleural fluid.  Clear lungs. Upper Abdomen: Normal imaged portions of the liver, adrenal glands, kidneys. Hyperattenuation throughout the spleen is likely related to sickle cell anemia and prior infarcts. Musculoskeletal: Mildly increased density within the T7 and T9 vertebral bodies may relate to sickle cell anemia induced infarcts. This is similar. Review of the MIP images confirms the above findings. IMPRESSION: 1.  No evidence of pulmonary embolism. 2.  No acute process in the chest. 3. Catheter fragment in the left  brachiocephalic vein, unchanged including back to 2015. Electronically Signed   By: Abigail Miyamoto M.D.   On: 10/19/2015 17:02   Ir Fluoro Guide Cv Line Right  Result Date: 10/08/2015 INDICATION: History of sickle cell disease, in need of durable intravenous access for frequent blood draws and medication administration. Note, patient has history of prior Port a catheter placement however recently had Port a catheter removed secondary to bacteremia. As such, request made made for placement of a tunneled central venous catheter in lieu of placement of a formal Port a Catheter. EXAM: TUNNELED PICC LINE WITH ULTRASOUND AND FLUOROSCOPIC GUIDANCE MEDICATIONS: None ANESTHESIA/SEDATION: None COMPLICATIONS: None immediate. PROCEDURE: Informed written consent was obtained from the patient after a discussion of the risks, benefits, and alternatives to treatment. Questions regarding the procedure were encouraged and answered. The right neck and chest were prepped with chlorhexidine in a sterile fashion, and a sterile drape was applied covering the operative field. Maximum barrier sterile technique with sterile gowns and gloves were used for the procedure. A timeout was performed prior to the initiation of the procedure. After creating a small venotomy incision, a micropuncture kit was utilized to access the right internal jugular vein under direct, real-time ultrasound guidance after the overlying soft tissues were anesthetized with 1% lidocaine with epinephrine. Ultrasound image documentation was performed. The microwire was kinked to measure appropriate catheter length. The micropuncture sheath was exchanged for a peel-away sheath over a guidewire. A 5 French single lumen tunneled PICC measuring 20 cm was tunneled in a retrograde fashion from the anterior chest wall to the venotomy incision. The catheter was then placed through the peel-away sheath with tip ultimately positioned at the superior caval-atrial junction. Final  catheter positioning was confirmed and documented with a spot radiographic image. The catheter aspirates and flushes normally. The catheter was flushed with appropriate volume heparin dwells. The catheter exit site was secured with a 0-Prolene retention suture. The venotomy incision was closed with an interrupted 4-0 Vicryl, Dermabond and Steri-strips. Dressings were applied. The patient tolerated the procedure well without immediate post procedural complication. FINDINGS: After catheter placement, the tip lies within the superior cavoatrial junction. The catheter aspirates and flushes normally and is ready for immediate use. Re- demonstrated abandoned catheter tubing overlying the expected location of the central aspect of the left innominate vein. IMPRESSION: Successful placement of 20 cm single lumen tunneled PICC catheter via the right internal jugular vein with tip terminating at the superior caval atrial junction. The catheter is ready for immediate use. Electronically Signed   By: Sandi Mariscal  M.D.   On: 10/08/2015 17:04   Ir US Guide Vasc Access Right  Result Date: 10/08/2015 INDICATION: History of sickle cell disease, in need of durable intravenous access for frequent blood draws and medication administration. Note, patient has history of prior Port a catheter placement however recently had Port a catheter removed secondary to bacteremia. As such, request made made for placement of a tunneled central venous catheter in lieu of placement of a formal Port a Catheter. EXAM: TUNNELED PICC LINE WITH ULTRASOUND AND FLUOROSCOPIC GUIDANCE MEDICATIONS: None ANESTHESIA/SEDATION: None COMPLICATIONS: None immediate. PROCEDURE: Informed written consent was obtained from the patient after a discussion of the risks, benefits, and alternatives to treatment. Questions regarding the procedure were encouraged and answered. The right neck and chest were prepped with chlorhexidine in a sterile fashion, and a sterile drape was  applied covering the operative field. Maximum barrier sterile technique with sterile gowns and gloves were used for the procedure. A timeout was performed prior to the initiation of the procedure. After creating a small venotomy incision, a micropuncture kit was utilized to access the right internal jugular vein under direct, real-time ultrasound guidance after the overlying soft tissues were anesthetized with 1% lidocaine with epinephrine. Ultrasound image documentation was performed. The microwire was kinked to measure appropriate catheter length. The micropuncture sheath was exchanged for a peel-away sheath over a guidewire. A 5 French single lumen tunneled PICC measuring 20 cm was tunneled in a retrograde fashion from the anterior chest wall to the venotomy incision. The catheter was then placed through the peel-away sheath with tip ultimately positioned at the superior caval-atrial junction. Final catheter positioning was confirmed and documented with a spot radiographic image. The catheter aspirates and flushes normally. The catheter was flushed with appropriate volume heparin dwells. The catheter exit site was secured with a 0-Prolene retention suture. The venotomy incision was closed with an interrupted 4-0 Vicryl, Dermabond and Steri-strips. Dressings were applied. The patient tolerated the procedure well without immediate post procedural complication. FINDINGS: After catheter placement, the tip lies within the superior cavoatrial junction. The catheter aspirates and flushes normally and is ready for immediate use. Re- demonstrated abandoned catheter tubing overlying the expected location of the central aspect of the left innominate vein. IMPRESSION: Successful placement of 20 cm single lumen tunneled PICC catheter via the right internal jugular vein with tip terminating at the superior caval atrial junction. The catheter is ready for immediate use. Electronically Signed   By: Sandi Mariscal M.D.   On:  10/08/2015 17:04    Scheduled Meds: . ciprofloxacin  400 mg Intravenous BID  . DULoxetine  60 mg Oral Daily  . enoxaparin (LOVENOX) injection  40 mg Subcutaneous QHS  . famotidine  20 mg Oral BID  . folic acid  1 mg Oral Daily  . HYDROmorphone   Intravenous Q4H  . hydroxyurea  500 mg Oral Daily  . ketorolac  30 mg Intravenous Q6H  . morphine  30 mg Oral Q12H  . senna-docusate  1 tablet Oral BID  . sodium chloride flush  3 mL Intravenous Q12H  . topiramate  100 mg Oral QHS   Continuous Infusions: . 0.45 % NaCl with KCl 20 mEq / L 100 mL/hr at 11/06/15 1155    Principal Problem:   Sickle cell pain crisis ([facility]) Active Problems:   Neuropathy ([facility])   Depression   Hypokalemia   Chronic pain syndrome   Chest pain   Leukocytosis    In excess of 25 minutes was  spent during this visit. Greater than 50% involved face to face contact with the patient for assessment, counseling and coordination of care.

## 2015-11-06 NOTE — Progress Notes (Signed)
   11/06/15 1700  Clinical Encounter Type  Visited With Patient  Visit Type Psychological support;Spiritual support  Referral From Chaplain  Stress Factors  Patient Stress Factors Loss of control  Counseling intern visited with patient to offer emotional and spiritual support. Initially, patient was hesitant to talk but became more enlivened as the intern asked questions about her well-being. Patient indicated that she was tired of being in so much pain all the time and that it was very hard for her to feel any kind of sustained relief. She stated that she tries to think positively and to pray but that eventually she gives up because they don't help with the pain. When asked, patient stated that she had not had visitors and that her loved ones live in [place]. Patient was concerned about being discharged too early, as she didn't feel she was ready to leave and did not want to endure a rapid readmission. Patient indicated her frustration with her situation and not feeling supported, even though she continues to do things to help keep her spirits up and to help relieve her physical pain. Patient was agreeable to intern checking back in with her tomorrow.   Brittany AlstromShaunta Jasmina Gendron, Counseling Intern Department of Spiritual Care and [facility] LLCWholeness Supervisor - [place], [place] Voicemail - [phone]

## 2015-11-07 DIAGNOSIS — N3000 Acute cystitis without hematuria: Secondary | ICD-10-CM

## 2015-11-07 MED ORDER — CIPROFLOXACIN HCL 500 MG PO TABS
500.0000 mg | ORAL_TABLET | Freq: Two times a day (BID) | ORAL | Status: DC
  Administered 2015-11-07 – 2015-11-11 (×2): 500 mg via ORAL
  Filled 2015-11-07 (×4): qty 1

## 2015-11-07 MED ORDER — HYDROMORPHONE HCL 2 MG/ML IJ SOLN
2.0000 mg | INTRAMUSCULAR | Status: DC | PRN
  Administered 2015-11-08: 2 mg via INTRAVENOUS
  Filled 2015-11-07: qty 1

## 2015-11-07 MED ORDER — HYDROMORPHONE HCL 2 MG/ML IJ SOLN
2.0000 mg | INTRAMUSCULAR | Status: AC
  Administered 2015-11-07 – 2015-11-08 (×8): 2 mg via INTRAVENOUS

## 2015-11-07 MED ORDER — HYDROMORPHONE 1 MG/ML IV SOLN
INTRAVENOUS | Status: DC
  Administered 2015-11-07: 21:00:00 via INTRAVENOUS
  Administered 2015-11-07: 11.2 mg via INTRAVENOUS
  Administered 2015-11-07: 8 mg via INTRAVENOUS
  Administered 2015-11-08: 2 mg via INTRAVENOUS
  Administered 2015-11-08: 25 mg via INTRAVENOUS
  Administered 2015-11-08: 11.18 mg via INTRAVENOUS
  Administered 2015-11-08: 04:00:00 via INTRAVENOUS
  Administered 2015-11-09: 15 mg via INTRAVENOUS
  Administered 2015-11-09: 9.2 mg via INTRAVENOUS
  Administered 2015-11-09: 18.6 mg via INTRAVENOUS
  Administered 2015-11-09: 19.4 mg via INTRAVENOUS
  Administered 2015-11-10: 5 mg via INTRAVENOUS
  Administered 2015-11-10: 14.2 mg via INTRAVENOUS
  Administered 2015-11-10: 9.8 mg via INTRAVENOUS
  Administered 2015-11-10: 18.6 mg via INTRAVENOUS
  Administered 2015-11-10 (×2): via INTRAVENOUS
  Administered 2015-11-11: 4.19 mg via INTRAVENOUS
  Administered 2015-11-11: 08:00:00 via INTRAVENOUS
  Administered 2015-11-11: 4.2 mg via INTRAVENOUS
  Filled 2015-11-07 (×10): qty 25

## 2015-11-07 MED ORDER — HYDROMORPHONE HCL 2 MG/ML IJ SOLN
2.0000 mg | INTRAMUSCULAR | Status: DC | PRN
  Administered 2015-11-07: 2 mg via INTRAVENOUS

## 2015-11-07 NOTE — Progress Notes (Signed)
Date:  November 07, 2015 Chart reviewed for concurrent status and case management needs. Will continue to follow the patient for status change: Discharge Planning: following for needs Expected discharge date: [id number] Kayra Crowell, BSN, RN3, CCM   [phone] 

## 2015-11-07 NOTE — Progress Notes (Signed)
PHARMACIST - PHYSICIAN COMMUNICATION DR:   Ashley RoyaltyMatthews CONCERNING: Antibiotic IV to Oral Route Change Policy  RECOMMENDATION: This patient is receiving cipro by the intravenous route.  Based on criteria approved by the Pharmacy and Therapeutics Committee, the antibiotic(s) is/are being converted to the equivalent oral dose form(s).   DESCRIPTION: These criteria include:  Patient being treated for a respiratory tract infection, urinary tract infection, cellulitis or clostridium difficile associated diarrhea if on metronidazole  The patient is not neutropenic and does not exhibit a GI malabsorption state  The patient is eating (either orally or via tube) and/or has been taking other orally administered medications for a least 24 hours  The patient is improving clinically and has a Tmax < 100.5  If you have questions about this conversion, please contact the Pharmacy Department  []   [phone]( [phone] )  [facility] []   [phone]( [phone] )  [facility] []   [phone]( [phone] )  [facility] []   [phone]( [phone] )  [facility] [x]   [phone]( [phone]  [facility]  Hessie KnowsJustin M Jousha Schwandt, PharmD, [place] Pager [phone] 11/07/2015 9:19 AM

## 2015-11-07 NOTE — Progress Notes (Signed)
SICKLE CELL SERVICE PROGRESS NOTE  Kay Shippy ZOX:[id number] DOB: [birth year] DOA: 11/04/2015 PCP: Jeanann Lewandowsky, MD  Assessment/Plan: Principal Problem:   Sickle cell pain crisis (HCC) Active Problems:   Neuropathy (HCC)   Depression   Hypokalemia   Chronic pain syndrome   Chest pain   Leukocytosis  1. Hb SS with crisis: Increase her PCA to a bolus to 0.6  and 1 hour limit of 3.6 mg. Continue clinician assisted doses on a q 2 hour PRN basis and continue Toradol and IVF.  Will re-assess pain tomorrow.  2. Leukocytosis: Pt has a mild leukocytosis which is likely secondary to UTI. Will continue to monitor.  3. Hypokalemia:Resolved with replacement. 4. Anemia of chronic disease: Hb stable at baseline.  5. Chronic pain: Pt has known chronic pain and is on MS Contin. Continue 6. Medication non-adherence: Pt has been prescribed Hydrea 1000 mg daily but takes only 500 mg which is sub-therapeutic.  7. UTI: Pt had urinalysis consistent with UTI. No cultures were sent but she is being treated for UTI. She is on day #2/3 of ciprofloxicin.  Code Status: Full Code Family Communication: N/A Disposition Plan: Not yet ready for discharge  Ary Rudnick A.  Pager [phone]. If 7PM-7AM, please contact night-coverage.  11/07/2015, 4:04 PM  LOS: 3 days   Interim History: Pt reports pain in hips, back and ribs still at a level of 10/10.  She has used 61.5 mg with 117/91:demands/deliveries in the past 24 hours and an additional 18 mg of Dilaudid in clinician assisted doses. Last BM yesterday.  Consultants:  None  Procedures:  None  Antibiotics:  None   Objective: Vitals:   11/07/15 1000 11/07/15 1048 11/07/15 1258 11/07/15 1436  BP: (!) 82/46 (!) 92/48  (!) 95/52  Pulse: 79   79  Resp: 16  16 17   Temp: 98.6 F (37 C)     TempSrc: Oral     SpO2:  100% 100% 100%  Weight:      Height:       Weight change:   Intake/Output Summary (Last 24 hours) at 11/07/15 1604 Last data  filed at 11/07/15 1500  Gross per 24 hour  Intake          4231.67 ml  Output                0 ml  Net          4231.67 ml    General: Alert, awake, oriented x3, in moderate distress secondary to pain.  HEENT: [place]/AT PEERL, EOMI, anicteric Neck: Trachea midline,  no masses, no thyromegal,y no JVD, no carotid bruit OROPHARYNX:  Moist, No exudate/ erythema/lesions.  Heart: Regular rate and rhythm, without murmurs, rubs, gallops, PMI non-displaced, no heaves or thrills on palpation.  Lungs: Clear to auscultation, no wheezing or rhonchi noted. No increased vocal fremitus resonant to percussion  Abdomen: Soft, nontender, nondistended, positive bowel sounds, no masses no hepatosplenomegaly noted.  Neuro: No focal neurological deficits noted cranial nerves II through XII grossly intact. Strength at functional baseline in bilateral upper and lower extremities. Musculoskeletal: No warmth swelling or erythema around joints, no spinal tenderness noted. Psychiatric: Patient alert and oriented x3, good cognition, good recent to remote recall.    Data Reviewed: Basic Metabolic Panel:  Recent Labs Lab 11/04/15 1700 11/05/15 0415 11/06/15 0440  NA 140 141 140  K 2.9* 3.3* 3.7  CL 112* 114* 114*  CO2 21* 24 21*  GLUCOSE 123* 148* 137*  BUN 6  5* 7  CREATININE 0.55 0.37* 0.48  CALCIUM 9.0 8.6* 8.8*  MG  --  1.7  --    Liver Function Tests:  Recent Labs Lab 11/04/15 1700 11/05/15 0415 11/06/15 0440  AST 36 28 31  ALT 23 18 20   ALKPHOS 69 60 61  BILITOT 1.6* 2.0* 1.9*  PROT 7.5 6.4* 6.3*  ALBUMIN 4.3 3.5 3.7   No results for input(s): LIPASE, AMYLASE in the last 168 hours. No results for input(s): AMMONIA in the last 168 hours. CBC:  Recent Labs Lab 11/01/15 1125 11/04/15 1700 11/05/15 0415 11/06/15 0440  WBC 12.3* 12.0* 11.5* 12.7*  NEUTROABS 7.8* 8.2* 5.2 7.0  HGB 7.9* 8.6* 7.2* 7.2*  HCT 22.9* 24.8* 20.9* 20.7*  MCV 100.4* 99.6 100.5* 101.0*  PLT 352 368 320 277    Cardiac Enzymes:  Recent Labs Lab 11/04/15 1700 11/05/15 0135 11/05/15 0415  TROPONINI <0.03 <0.03 <0.03   BNP (last 3 results) No results for input(s): BNP in the last 8760 hours.  ProBNP (last 3 results) No results for input(s): PROBNP in the last 8760 hours.  CBG: No results for input(s): GLUCAP in the last 168 hours.  No results found for this or any previous visit (from the past 240 hour(s)).   Studies: Dg Chest 2 View  Result Date: 11/04/2015 CLINICAL DATA:  30 y/o female with sickle cell crisis onset today, c/o LEFT CP and leg pain. Hx heart murmur, MI, PE, former smoker. EXAM: CHEST  2 VIEW COMPARISON:  10/19/2015 FINDINGS: Right central line tip overlies the level of superior vena cava. Heart size is normal. Lungs are clear. Visualized osseous structures have a normal appearance. Surgical clips are noted in the right upper quadrant the abdomen. IMPRESSION: No active cardiopulmonary disease. Electronically Signed   By: Norva PavlovElizabeth  Brown M.D.   On: 11/04/2015 18:10   Dg Chest 2 View  Result Date: 10/19/2015 CLINICAL DATA:  Pt c/o central chest pain radiating to left side onset x approx. 4 hours ago. Also c/o nausea and fever. H/o sickle cell and heart attack x 15 years ago. EXAM: CHEST  2 VIEW COMPARISON:  10/08/2015 FINDINGS: Cardiac silhouette is top-normal in size. No mediastinal or hilar masses or evidence of adenopathy. Lungs are clear.  No pleural effusion or pneumothorax. Right internal jugular central venous line catheter tip projects in the lower superior vena cava near the caval atrial junction. Bony thorax is unremarkable. IMPRESSION: No active cardiopulmonary disease. Electronically Signed   By: Amie Portlandavid  Ormond M.D.   On: 10/19/2015 13:56   Dg Chest 2 View  Result Date: 10/08/2015 CLINICAL DATA:  Sickle cell disease and complains of chest pain. Mild shortness of breath. EXAM: CHEST  2 VIEW COMPARISON:  09/02/2015 FINDINGS: Right subclavian Port-A-Cath has been removed  and the patient now has a right jugular central line with the tip near the superior cavoatrial junction. There is a chronic catheter fragment in the left innominate vein that is poorly characterized on this examination. Both lungs are clear without pulmonary edema or airspace disease. Heart and mediastinum are within normal limits. The trachea is midline. No pleural effusions. No acute bone abnormality. IMPRESSION: No active cardiopulmonary disease. Central line tip at the superior cavoatrial junction. Electronically Signed   By: Richarda OverlieAdam  Henn M.D.   On: 10/08/2015 16:53   Ct Angio Chest Pe W And/or Wo Contrast  Result Date: 10/19/2015 CLINICAL DATA:  Chest pain and shortness of breath. Fever. History sickle cell anemia. EXAM: CT ANGIOGRAPHY CHEST WITH  CONTRAST TECHNIQUE: Multidetector CT imaging of the chest was performed using the standard protocol during bolus administration of intravenous contrast. Multiplanar CT image reconstructions and MIPs were obtained to evaluate the vascular anatomy. CONTRAST:  100 cc of Isovue 370 COMPARISON:  Chest radiograph of earlier today. Most recent CT 09/02/2015. FINDINGS: Cardiovascular: The quality of this exam for evaluation of pulmonary embolism is excellent. No evidence of pulmonary embolism. Normal aortic caliber without dissection. Heart size upper normal. Trace pericardial fluid is likely physiologic. Catheter fragment again identified within the left brachiocephalic vein. A right-sided PICC line terminates at the low SVC. Mediastinum/Nodes: No mediastinal or hilar adenopathy. Minimal residual thymus in the anterior mediastinum. Lungs/Pleura: No pleural fluid.  Clear lungs. Upper Abdomen: Normal imaged portions of the liver, adrenal glands, kidneys. Hyperattenuation throughout the spleen is likely related to sickle cell anemia and prior infarcts. Musculoskeletal: Mildly increased density within the T7 and T9 vertebral bodies may relate to sickle cell anemia induced  infarcts. This is similar. Review of the MIP images confirms the above findings. IMPRESSION: 1.  No evidence of pulmonary embolism. 2.  No acute process in the chest. 3. Catheter fragment in the left brachiocephalic vein, unchanged including back to 2015. Electronically Signed   By: Jeronimo Greaves M.D.   On: 10/19/2015 17:02    Scheduled Meds: . ciprofloxacin  500 mg Oral BID  . DULoxetine  60 mg Oral Daily  . enoxaparin (LOVENOX) injection  40 mg Subcutaneous QHS  . famotidine  20 mg Oral BID  . folic acid  1 mg Oral Daily  . HYDROmorphone   Intravenous Q4H  . hydroxyurea  500 mg Oral Daily  . ketorolac  30 mg Intravenous Q6H  . morphine  30 mg Oral Q12H  . senna-docusate  1 tablet Oral BID  . sodium chloride flush  3 mL Intravenous Q12H  . topiramate  100 mg Oral QHS   Continuous Infusions: . 0.45 % NaCl with KCl 20 mEq / L 100 mL/hr at 11/07/15 1047    Principal Problem:   Sickle cell pain crisis (HCC) Active Problems:   Neuropathy (HCC)   Depression   Hypokalemia   Chronic pain syndrome   Chest pain   Leukocytosis    In excess of 25 minutes was spent during this visit. Greater than 50% involved face to face contact with the patient for assessment, counseling and coordination of care.

## 2015-11-08 LAB — RETICULOCYTES
RBC.: 2.32 MIL/uL — ABNORMAL LOW (ref 3.87–5.11)
RETIC CT PCT: 8.5 % — AB (ref 0.4–3.1)
Retic Count, Absolute: 197.2 10*3/uL — ABNORMAL HIGH (ref 19.0–186.0)

## 2015-11-08 LAB — CBC WITH DIFFERENTIAL/PLATELET
Basophils Absolute: 0.1 10*3/uL (ref 0.0–0.1)
Basophils Relative: 1 %
EOS ABS: 0.6 10*3/uL (ref 0.0–0.7)
Eosinophils Relative: 6 %
HEMATOCRIT: 23 % — AB (ref 36.0–46.0)
HEMOGLOBIN: 7.9 g/dL — AB (ref 12.0–15.0)
LYMPHS ABS: 4.1 10*3/uL — AB (ref 0.7–4.0)
LYMPHS PCT: 38 %
MCH: 34.1 pg — AB (ref 26.0–34.0)
MCHC: 34.3 g/dL (ref 30.0–36.0)
MCV: 99.1 fL (ref 78.0–100.0)
Monocytes Absolute: 1.2 10*3/uL — ABNORMAL HIGH (ref 0.1–1.0)
Monocytes Relative: 11 %
NEUTROS ABS: 4.9 10*3/uL (ref 1.7–7.7)
NEUTROS PCT: 44 %
Platelets: 299 10*3/uL (ref 150–400)
RBC: 2.32 MIL/uL — AB (ref 3.87–5.11)
RDW: 16.6 % — ABNORMAL HIGH (ref 11.5–15.5)
WBC: 10.9 10*3/uL — AB (ref 4.0–10.5)

## 2015-11-08 LAB — BASIC METABOLIC PANEL
ANION GAP: 5 (ref 5–15)
BUN: 8 mg/dL (ref 6–20)
CALCIUM: 8.8 mg/dL — AB (ref 8.9–10.3)
CHLORIDE: 113 mmol/L — AB (ref 101–111)
CO2: 23 mmol/L (ref 22–32)
CREATININE: 0.54 mg/dL (ref 0.44–1.00)
GFR calc non Af Amer: >60 mL/min (ref >60)
Glucose, Bld: 123 mg/dL — ABNORMAL HIGH (ref 65–99)
Potassium: 3.9 mmol/L (ref 3.5–5.1)
SODIUM: 141 mmol/L (ref 135–145)

## 2015-11-08 MED ORDER — CAMPHOR-MENTHOL 0.5-0.5 % EX LOTN
TOPICAL_LOTION | CUTANEOUS | Status: DC | PRN
  Filled 2015-11-08: qty 222

## 2015-11-08 MED ORDER — HYDROMORPHONE HCL 2 MG/ML IJ SOLN
2.0000 mg | INTRAMUSCULAR | Status: DC | PRN
  Administered 2015-11-08 – 2015-11-10 (×19): 2 mg via INTRAVENOUS

## 2015-11-08 NOTE — Progress Notes (Signed)
SICKLE CELL SERVICE PROGRESS NOTE  Brittany Foster ZOX:[id number] DOB: 1985/03/16 DOA: 11/04/2015 PCP: Jeanann Lewandowsky, MD  Assessment/Plan: Principal Problem:   Sickle cell pain crisis (HCC) Active Problems:   Neuropathy (HCC)   Depression   Hypokalemia   Chronic pain syndrome   Chest pain   Leukocytosis  1. Hb SS with crisis:Pt reports that her pain was down to 6/10 and then escalated to 8/10 since about noon as she did not receive her PRN doses of Dilaudid. She is currently receiving a dose of her clinician assisted dose of Dilaudid. I will continue her PCA at bolus to 0.6  and 1 hour limit of 3.6 mg. Continue clinician assisted doses on a q 2 hour PRN basis and continue Toradol.  IVF decreased to [facility].  Will re-assess pain tomorrow.  2. Leukocytosis:  Improved.Pt had a mild leukocytosis which is likely secondary to UTI. Will continue to monitor.  3. Hypokalemia:Resolved with replacement. 4. Anemia of chronic disease: Hb stable at baseline.  5. Chronic pain: Pt has known chronic pain and is on MS Contin. Continue 6. Medication non-adherence: Pt has been prescribed Hydrea 1000 mg daily but takes only 500 mg which is sub-therapeutic.  7. UTI: Pt had urinalysis consistent with UTI. No cultures were sent but she is being treated for UTI. She has refused 2/4 doses in a 3 day course of therapy with ciprofloxicin.  Code Status: Full Code Family Communication: N/A Disposition Plan: Anticipate discharge in 24-48 hours   Ronee Ranganathan A.  Pager [phone]. If 7PM-7AM, please contact night-coverage.  11/08/2015, 4:42 PM  LOS: 4 days   Interim History: Pt reports pain  still at a level of 10/10.  Pt reports that her pain was down to 6/10 in hips and back and then escalated to 8/10 since about noon as she did not receive her PRN doses of Dilaudid.She has used 76.18 mg with 188/97:demands/deliveries in the past 24 hours and an additional 18 mg of Dilaudid in clinician assisted doses. Last BM  yesterday.  Consultants:  None  Procedures:  None  Antibiotics:  Ciprofloxacin 9/5 ( Refused 2 doses)>>   Objective: Vitals:   11/08/15 1159 11/08/15 1200 11/08/15 1531 11/08/15 1553  BP:    (!) 97/51  Pulse:    (!) 101  Resp: 15 15 15 15   Temp:      TempSrc:      SpO2: 100% 100% 100% 98%  Weight:      Height:       Weight change:   Intake/Output Summary (Last 24 hours) at 11/08/15 1642 Last data filed at 11/08/15 0905  Gross per 24 hour  Intake              120 ml  Output                0 ml  Net              120 ml    General: Alert, awake, oriented x3, in mild distress secondary to pain.  HEENT: [place]/AT PEERL, EOMI, anicteric Neck: Trachea midline,  no masses, no thyromegal,y no JVD, no carotid bruit OROPHARYNX:  Moist, No exudate/ erythema/lesions.  Heart: Regular rate and rhythm, without murmurs, rubs, gallops, PMI non-displaced, no heaves or thrills on palpation.  Lungs: Clear to auscultation, no wheezing or rhonchi noted. No increased vocal fremitus resonant to percussion  Abdomen: Soft, nontender, nondistended, positive bowel sounds, no masses no hepatosplenomegaly noted.  Neuro: No focal neurological deficits noted cranial  nerves II through XII grossly intact. Strength at functional baseline in bilateral upper and lower extremities. Musculoskeletal: No warmth swelling or erythema around joints, no spinal tenderness noted. Psychiatric: Patient alert and oriented x3, good cognition, good recent to remote recall.    Data Reviewed: Basic Metabolic Panel:  Recent Labs Lab 11/04/15 1700 11/05/15 0415 11/06/15 0440 11/08/15 1450  NA 140 141 140 141  K 2.9* 3.3* 3.7 3.9  CL 112* 114* 114* 113*  CO2 21* 24 21* 23  GLUCOSE 123* 148* 137* 123*  BUN 6 5* 7 8  CREATININE 0.55 0.37* 0.48 0.54  CALCIUM 9.0 8.6* 8.8* 8.8*  MG  --  1.7  --   --    Liver Function Tests:  Recent Labs Lab 11/04/15 1700 11/05/15 0415 11/06/15 0440  AST 36 28 31  ALT 23  18 20   ALKPHOS 69 60 61  BILITOT 1.6* 2.0* 1.9*  PROT 7.5 6.4* 6.3*  ALBUMIN 4.3 3.5 3.7   No results for input(s): LIPASE, AMYLASE in the last 168 hours. No results for input(s): AMMONIA in the last 168 hours. CBC:  Recent Labs Lab 11/04/15 1700 11/05/15 0415 11/06/15 0440 11/08/15 1450  WBC 12.0* 11.5* 12.7* 10.9*  NEUTROABS 8.2* 5.2 7.0 4.9  HGB 8.6* 7.2* 7.2* 7.9*  HCT 24.8* 20.9* 20.7* 23.0*  MCV 99.6 100.5* 101.0* 99.1  PLT 368 320 277 299   Cardiac Enzymes:  Recent Labs Lab 11/04/15 1700 11/05/15 0135 11/05/15 0415  TROPONINI <0.03 <0.03 <0.03   BNP (last 3 results) No results for input(s): BNP in the last 8760 hours.  ProBNP (last 3 results) No results for input(s): PROBNP in the last 8760 hours.  CBG: No results for input(s): GLUCAP in the last 168 hours.  No results found for this or any previous visit (from the past 240 hour(s)).   Studies: Dg Chest 2 View  Result Date: 11/04/2015 CLINICAL DATA:  30 y/o female with sickle cell crisis onset today, c/o LEFT CP and leg pain. Hx heart murmur, MI, PE, former smoker. EXAM: CHEST  2 VIEW COMPARISON:  10/19/2015 FINDINGS: Right central line tip overlies the level of superior vena cava. Heart size is normal. Lungs are clear. Visualized osseous structures have a normal appearance. Surgical clips are noted in the right upper quadrant the abdomen. IMPRESSION: No active cardiopulmonary disease. Electronically Signed   By: Norva PavlovElizabeth  Brown M.D.   On: 11/04/2015 18:10   Dg Chest 2 View  Result Date: 10/19/2015 CLINICAL DATA:  Pt c/o central chest pain radiating to left side onset x approx. 4 hours ago. Also c/o nausea and fever. H/o sickle cell and heart attack x 15 years ago. EXAM: CHEST  2 VIEW COMPARISON:  10/08/2015 FINDINGS: Cardiac silhouette is top-normal in size. No mediastinal or hilar masses or evidence of adenopathy. Lungs are clear.  No pleural effusion or pneumothorax. Right internal jugular central venous  line catheter tip projects in the lower superior vena cava near the caval atrial junction. Bony thorax is unremarkable. IMPRESSION: No active cardiopulmonary disease. Electronically Signed   By: Amie Portlandavid  Ormond M.D.   On: 10/19/2015 13:56   Ct Angio Chest Pe W And/or Wo Contrast  Result Date: 10/19/2015 CLINICAL DATA:  Chest pain and shortness of breath. Fever. History sickle cell anemia. EXAM: CT ANGIOGRAPHY CHEST WITH CONTRAST TECHNIQUE: Multidetector CT imaging of the chest was performed using the standard protocol during bolus administration of intravenous contrast. Multiplanar CT image reconstructions and MIPs were obtained to evaluate the  vascular anatomy. CONTRAST:  100 cc of Isovue 370 COMPARISON:  Chest radiograph of earlier today. Most recent CT 09/02/2015. FINDINGS: Cardiovascular: The quality of this exam for evaluation of pulmonary embolism is excellent. No evidence of pulmonary embolism. Normal aortic caliber without dissection. Heart size upper normal. Trace pericardial fluid is likely physiologic. Catheter fragment again identified within the left brachiocephalic vein. A right-sided PICC line terminates at the low SVC. Mediastinum/Nodes: No mediastinal or hilar adenopathy. Minimal residual thymus in the anterior mediastinum. Lungs/Pleura: No pleural fluid.  Clear lungs. Upper Abdomen: Normal imaged portions of the liver, adrenal glands, kidneys. Hyperattenuation throughout the spleen is likely related to sickle cell anemia and prior infarcts. Musculoskeletal: Mildly increased density within the T7 and T9 vertebral bodies may relate to sickle cell anemia induced infarcts. This is similar. Review of the MIP images confirms the above findings. IMPRESSION: 1.  No evidence of pulmonary embolism. 2.  No acute process in the chest. 3. Catheter fragment in the left brachiocephalic vein, unchanged including back to 2015. Electronically Signed   By: Jeronimo Greaves M.D.   On: 10/19/2015 17:02    Scheduled  Meds: . ciprofloxacin  500 mg Oral BID  . DULoxetine  60 mg Oral Daily  . enoxaparin (LOVENOX) injection  40 mg Subcutaneous QHS  . famotidine  20 mg Oral BID  . folic acid  1 mg Oral Daily  . HYDROmorphone   Intravenous Q4H  . hydroxyurea  500 mg Oral Daily  . ketorolac  30 mg Intravenous Q6H  . morphine  30 mg Oral Q12H  . senna-docusate  1 tablet Oral BID  . sodium chloride flush  3 mL Intravenous Q12H  . topiramate  100 mg Oral QHS   Continuous Infusions: . 0.45 % NaCl with KCl 20 mEq / L 10 mL/hr at 11/07/15 1628    Principal Problem:   Sickle cell pain crisis (HCC) Active Problems:   Neuropathy (HCC)   Depression   Hypokalemia   Chronic pain syndrome   Chest pain   Leukocytosis    In excess of 25 minutes was spent during this visit. Greater than 50% involved face to face contact with the patient for assessment, counseling and coordination of care.

## 2015-11-08 NOTE — Progress Notes (Signed)
   11/08/15 1700  Clinical Encounter Type  Visited With Patient  Visit Type Follow-up;Psychological support  Counseling intern visited briefly with patient. Patient appeared tired and and in a low mood and was not as open to talking as during intern's previous visit. Patient indicated that she had not seen her doctor today and did not know if discharge plans had been made. Intern offered to visit with her again on Monday if she remained in the hospital over the weekend.  Everlean AlstromShaunta Safi Culotta, Counseling Intern Department of Spiritual Care and [facility] Medical CenterWholeness Supervisor - [address] StreetChaplain Matt HeidelbergStalnaker, [place]MDiv

## 2015-11-08 NOTE — Progress Notes (Signed)
24 hour PCA totals  28.4mg  Dilaudid given. 39 Demands 33 Given

## 2015-11-09 NOTE — Progress Notes (Signed)
SICKLE CELL SERVICE PROGRESS NOTE  Brittany Foster XBJ:[id number] DOB: [birth year] DOA: 11/04/2015 PCP: Jeanann Lewandowsky, MD  Assessment/Plan: Principal Problem:   Sickle cell pain crisis (HCC) Active Problems:   Neuropathy (HCC)   Depression   Hypokalemia   Chronic pain syndrome   Chest pain   Leukocytosis  1. Hb SS with crisis:Pt reports that her pain is still at 10 out of 10. I will continue with the current regimen with the Dilaudid PCA and physician assisted dosing.  IVF decreased to [facility]  Will re-assess pain tomorrow. And probably decrease the frequency of the physician assisted dosing. 2. Leukocytosis:  Improved.Pt had a mild leukocytosis which is likely secondary to UTI. Will continue to monitor.  3. Hypokalemia:Resolved with replacement. 4. Anemia of chronic disease: Hb stable at baseline.  5. Chronic pain: Pt has known chronic pain and is on MS Contin. Continue 6. Medication non-adherence: Pt has been prescribed Hydrea 1000 mg daily but takes only 500 mg which is sub-therapeutic.  7. UTI: Pt had urinalysis consistent with UTI. No cultures were sent but she is being treated for UTI. She has refused 2/4 doses in a 3 day course of therapy with ciprofloxicin. We'll complete treatment.  Code Status: Full Code Family Communication: N/A Disposition Plan: Anticipate discharge in 24-48 hours   Brittany Foster,LAWAL  Pager [phone]. If 7PM-7AM, please contact night-coverage.  11/09/2015, 9:16 PM  LOS: 5 days   Interim History: Patient reports pain slipped 10 out of 10. She is always have pain on control normal to walk. She is getting her Dilaudid PCA with Toradol as well as physician assisted dosing of 2 mg every 2 hours. She was IV Benadryl mainly. She has used 78 mg with 186/94:demands/deliveries in the past 24 hours and an additional 20mg  of Dilaudid in clinician assisted doses. Last BM yesterday.  Consultants:  None  Procedures:  None  Antibiotics:  Ciprofloxacin 9/5 ( Refused 2  doses)>>   Objective: Vitals:   11/09/15 1027 11/09/15 1200 11/09/15 1300 11/09/15 1604  BP: 99/69  104/65   Pulse: 81  69   Resp: 11 16 16 17   Temp: 98.7 F (37.1 C)  97.3 F (36.3 C)   TempSrc: Oral  Oral   SpO2: 97% 99% 97% 99%  Weight:      Height:       Weight change:   Intake/Output Summary (Last 24 hours) at 11/09/15 2116 Last data filed at 11/09/15 1841  Gross per 24 hour  Intake             1200 ml  Output                4 ml  Net             1196 ml    General: Alert, awake, oriented x3, in mild distress secondary to pain.  HEENT: /AT PEERL, EOMI, anicteric Neck: Trachea midline,  no masses, no thyromegal,y no JVD, no carotid bruit OROPHARYNX:  Moist, No exudate/ erythema/lesions.  Heart: Regular rate and rhythm, without murmurs, rubs, gallops, PMI non-displaced, no heaves or thrills on palpation.  Lungs: Clear to auscultation, no wheezing or rhonchi noted. No increased vocal fremitus resonant to percussion  Abdomen: Soft, nontender, nondistended, positive bowel sounds, no masses no hepatosplenomegaly noted.  Neuro: No focal neurological deficits noted cranial nerves II through XII grossly intact. Strength at functional baseline in bilateral upper and lower extremities. Musculoskeletal: No warmth swelling or erythema around joints, no spinal tenderness noted. Psychiatric: Patient  alert and oriented x3, good cognition, good recent to remote recall.    Data Reviewed: Basic Metabolic Panel:  Recent Labs Lab 11/04/15 1700 11/05/15 0415 11/06/15 0440 11/08/15 1450  NA 140 141 140 141  K 2.9* 3.3* 3.7 3.9  CL 112* 114* 114* 113*  CO2 21* 24 21* 23  GLUCOSE 123* 148* 137* 123*  BUN 6 5* 7 8  CREATININE 0.55 0.37* 0.48 0.54  CALCIUM 9.0 8.6* 8.8* 8.8*  MG  --  1.7  --   --    Liver Function Tests:  Recent Labs Lab 11/04/15 1700 11/05/15 0415 11/06/15 0440  AST 36 28 31  ALT 23 18 20   ALKPHOS 69 60 61  BILITOT 1.6* 2.0* 1.9*  PROT 7.5 6.4* 6.3*   ALBUMIN 4.3 3.5 3.7   No results for input(s): LIPASE, AMYLASE in the last 168 hours. No results for input(s): AMMONIA in the last 168 hours. CBC:  Recent Labs Lab 11/04/15 1700 11/05/15 0415 11/06/15 0440 11/08/15 1450  WBC 12.0* 11.5* 12.7* 10.9*  NEUTROABS 8.2* 5.2 7.0 4.9  HGB 8.6* 7.2* 7.2* 7.9*  HCT 24.8* 20.9* 20.7* 23.0*  MCV 99.6 100.5* 101.0* 99.1  PLT 368 320 277 299   Cardiac Enzymes:  Recent Labs Lab 11/04/15 1700 11/05/15 0135 11/05/15 0415  TROPONINI <0.03 <0.03 <0.03   BNP (last 3 results) No results for input(s): BNP in the last 8760 hours.  ProBNP (last 3 results) No results for input(s): PROBNP in the last 8760 hours.  CBG: No results for input(s): GLUCAP in the last 168 hours.  No results found for this or any previous visit (from the past 240 hour(s)).   Studies: Dg Chest 2 View  Result Date: 11/04/2015 CLINICAL DATA:  30 y/o female with sickle cell crisis onset today, c/o LEFT CP and leg pain. Hx heart murmur, MI, PE, former smoker. EXAM: CHEST  2 VIEW COMPARISON:  10/19/2015 FINDINGS: Right central line tip overlies the level of superior vena cava. Heart size is normal. Lungs are clear. Visualized osseous structures have a normal appearance. Surgical clips are noted in the right upper quadrant the abdomen. IMPRESSION: No active cardiopulmonary disease. Electronically Signed   By: Norva PavlovElizabeth  Brown M.D.   On: 11/04/2015 18:10   Dg Chest 2 View  Result Date: 10/19/2015 CLINICAL DATA:  Pt c/o central chest pain radiating to left side onset x approx. 4 hours ago. Also c/o nausea and fever. H/o sickle cell and heart attack x 15 years ago. EXAM: CHEST  2 VIEW COMPARISON:  10/08/2015 FINDINGS: Cardiac silhouette is top-normal in size. No mediastinal or hilar masses or evidence of adenopathy. Lungs are clear.  No pleural effusion or pneumothorax. Right internal jugular central venous line catheter tip projects in the lower superior vena cava near the caval  atrial junction. Bony thorax is unremarkable. IMPRESSION: No active cardiopulmonary disease. Electronically Signed   By: Amie Portlandavid  Ormond M.D.   On: 10/19/2015 13:56   Ct Angio Chest Pe W And/or Wo Contrast  Result Date: 10/19/2015 CLINICAL DATA:  Chest pain and shortness of breath. Fever. History sickle cell anemia. EXAM: CT ANGIOGRAPHY CHEST WITH CONTRAST TECHNIQUE: Multidetector CT imaging of the chest was performed using the standard protocol during bolus administration of intravenous contrast. Multiplanar CT image reconstructions and MIPs were obtained to evaluate the vascular anatomy. CONTRAST:  100 cc of Isovue 370 COMPARISON:  Chest radiograph of earlier today. Most recent CT 09/02/2015. FINDINGS: Cardiovascular: The quality of this exam for evaluation of  pulmonary embolism is excellent. No evidence of pulmonary embolism. Normal aortic caliber without dissection. Heart size upper normal. Trace pericardial fluid is likely physiologic. Catheter fragment again identified within the left brachiocephalic vein. A right-sided PICC line terminates at the low SVC. Mediastinum/Nodes: No mediastinal or hilar adenopathy. Minimal residual thymus in the anterior mediastinum. Lungs/Pleura: No pleural fluid.  Clear lungs. Upper Abdomen: Normal imaged portions of the liver, adrenal glands, kidneys. Hyperattenuation throughout the spleen is likely related to sickle cell anemia and prior infarcts. Musculoskeletal: Mildly increased density within the T7 and T9 vertebral bodies may relate to sickle cell anemia induced infarcts. This is similar. Review of the MIP images confirms the above findings. IMPRESSION: 1.  No evidence of pulmonary embolism. 2.  No acute process in the chest. 3. Catheter fragment in the left brachiocephalic vein, unchanged including back to 2015. Electronically Signed   By: Jeronimo Greaves M.D.   On: 10/19/2015 17:02    Scheduled Meds: . ciprofloxacin  500 mg Oral BID  . DULoxetine  60 mg Oral Daily  .  enoxaparin (LOVENOX) injection  40 mg Subcutaneous QHS  . famotidine  20 mg Oral BID  . folic acid  1 mg Oral Daily  . HYDROmorphone   Intravenous Q4H  . hydroxyurea  500 mg Oral Daily  . morphine  30 mg Oral Q12H  . senna-docusate  1 tablet Oral BID  . sodium chloride flush  3 mL Intravenous Q12H  . topiramate  100 mg Oral QHS   Continuous Infusions: . 0.45 % NaCl with KCl 20 mEq / L 10 mL/hr at 11/08/15 2250    Principal Problem:   Sickle cell pain crisis (HCC) Active Problems:   Neuropathy (HCC)   Depression   Hypokalemia   Chronic pain syndrome   Chest pain   Leukocytosis    In excess of 25 minutes was spent during this visit. Greater than 50% involved face to face contact with the patient for assessment, counseling and coordination of care.

## 2015-11-09 NOTE — Progress Notes (Signed)
Late entry, Pt was found pacing the hall ways, she appears very upset about not being, able to receive  IV Benadryl, per DR Ashley RoyaltyMatthews. I have paged NP Elray McgregorMary Lynch who has given the Pt and option, to have IV Vistaril. AT this time the Pt is refusing the IV Vistaril and PO Benadryl. She is demanding to [name] Marshall, who has tried to console the Pt. Her decision still stands to not try the other options for itch control, she has expressed many  times her desire to leave AMA. I will continue to monitor and encourage.

## 2015-11-09 NOTE — Progress Notes (Signed)
[place], [place]. Report given.

## 2015-11-10 MED ORDER — HYDROMORPHONE HCL 2 MG/ML IJ SOLN
2.0000 mg | INTRAMUSCULAR | Status: DC | PRN
  Administered 2015-11-10 – 2015-11-11 (×5): 2 mg via INTRAVENOUS

## 2015-11-10 NOTE — Progress Notes (Signed)
This shift pt tearful and frustrated because of the inability to have benadryl IV.  She has refused PO Benadryl in liquid form, states that nothing works but the IV form. Has also refused Cipro, because she didn't receive the benadryl she requested. Will continue to monitor.

## 2015-11-10 NOTE — Progress Notes (Addendum)
Nursing care limited due to patients flat/withdrawn temperament. Unable to perform full assessment.

## 2015-11-10 NOTE — Progress Notes (Signed)
SICKLE CELL SERVICE PROGRESS NOTE  Brittany Foster NUU:[id number] DOB: [birth year] DOA: 11/04/2015 PCP: Jeanann Lewandowsky, MD  Assessment/Plan: Principal Problem:   Sickle cell pain crisis (HCC) Active Problems:   Neuropathy (HCC)   Depression   Hypokalemia   Chronic pain syndrome   Chest pain   Leukocytosis  1. Hb SS with crisis:Pt reports that her pain is still at 8- 10 out of 10. I will continue with the current regimen with the Dilaudid PCA and decrease the physician assisted dosing to 2 mg q3 hours and hopefully change to PO tomorrow. Patient to be mobilized.  2. Leukocytosis:  Improved.Pt had a mild leukocytosis which is likely secondary to UTI. Will continue to monitor.  3. Hypokalemia:Resolved with replacement. 4. Anemia of chronic disease: Hb stable at baseline.  5. Chronic pain: Pt has known chronic pain and is on MS Contin. Continue 6. Medication non-adherence: Pt has been prescribed Hydrea 1000 mg daily but takes only 500 mg which is sub-therapeutic.  7. UTI: Pt had urinalysis consistent with UTI. No cultures were sent but she is being treated for UTI. We'll complete treatment today.  Code Status: Full Code Family Communication: N/A Disposition Plan: Anticipate discharge in 24-48 hours   GARBA,LAWAL  Pager [phone]. If 7PM-7AM, please contact night-coverage.  11/10/2015, 1:34 PM  LOS: 6 days   Interim History: Patient reports pain is at 8 out of 10. She has always had higher level pain. She is getting her Dilaudid PCA with Toradol as well as physician assisted dosing of 2 mg every 2 hours. She was IV Benadryl mainly. She has used 78 mg with 186/94:demands/deliveries in the past 24 hours and an additional 20mg  of Dilaudid in clinician assisted doses. Last BM yesterday.  Consultants:  None  Procedures:  None  Antibiotics:  Ciprofloxacin 9/5 ( Refused 2 doses)>>   Objective: Vitals:   11/10/15 0006 11/10/15 0400 11/10/15 0607 11/10/15 0807  BP:   92/66   Pulse:    97   Resp: 14 16 15 14   Temp:   98.5 F (36.9 C)   TempSrc:   Oral   SpO2: 99% 98% 97% 97%  Weight:      Height:       Weight change:   Intake/Output Summary (Last 24 hours) at 11/10/15 1334 Last data filed at 11/09/15 2126  Gross per 24 hour  Intake              600 ml  Output                1 ml  Net              599 ml    General: Alert, awake, oriented x3, in mild distress secondary to pain.  HEENT: [place]/AT PEERL, EOMI, anicteric Neck: Trachea midline,  no masses, no thyromegal,y no JVD, no carotid bruit OROPHARYNX:  Moist, No exudate/ erythema/lesions.  Heart: Regular rate and rhythm, without murmurs, rubs, gallops, PMI non-displaced, no heaves or thrills on palpation.  Lungs: Clear to auscultation, no wheezing or rhonchi noted. No increased vocal fremitus resonant to percussion  Abdomen: Soft, nontender, nondistended, positive bowel sounds, no masses no hepatosplenomegaly noted.  Neuro: No focal neurological deficits noted cranial nerves II through XII grossly intact. Strength at functional baseline in bilateral upper and lower extremities. Musculoskeletal: No warmth swelling or erythema around joints, no spinal tenderness noted. Psychiatric: Patient alert and oriented x3, good cognition, good recent to remote recall.    Data Reviewed: Basic  Metabolic Panel:  Recent Labs Lab 11/04/15 1700 11/05/15 0415 11/06/15 0440 11/08/15 1450  NA 140 141 140 141  K 2.9* 3.3* 3.7 3.9  CL 112* 114* 114* 113*  CO2 21* 24 21* 23  GLUCOSE 123* 148* 137* 123*  BUN 6 5* 7 8  CREATININE 0.55 0.37* 0.48 0.54  CALCIUM 9.0 8.6* 8.8* 8.8*  MG  --  1.7  --   --    Liver Function Tests:  Recent Labs Lab 11/04/15 1700 11/05/15 0415 11/06/15 0440  AST 36 28 31  ALT 23 18 20   ALKPHOS 69 60 61  BILITOT 1.6* 2.0* 1.9*  PROT 7.5 6.4* 6.3*  ALBUMIN 4.3 3.5 3.7   No results for input(s): LIPASE, AMYLASE in the last 168 hours. No results for input(s): AMMONIA in the last 168  hours. CBC:  Recent Labs Lab 11/04/15 1700 11/05/15 0415 11/06/15 0440 11/08/15 1450  WBC 12.0* 11.5* 12.7* 10.9*  NEUTROABS 8.2* 5.2 7.0 4.9  HGB 8.6* 7.2* 7.2* 7.9*  HCT 24.8* 20.9* 20.7* 23.0*  MCV 99.6 100.5* 101.0* 99.1  PLT 368 320 277 299   Cardiac Enzymes:  Recent Labs Lab 11/04/15 1700 11/05/15 0135 11/05/15 0415  TROPONINI <0.03 <0.03 <0.03   BNP (last 3 results) No results for input(s): BNP in the last 8760 hours.  ProBNP (last 3 results) No results for input(s): PROBNP in the last 8760 hours.  CBG: No results for input(s): GLUCAP in the last 168 hours.  No results found for this or any previous visit (from the past 240 hour(s)).   Studies: Dg Chest 2 View  Result Date: 11/04/2015 CLINICAL DATA:  30 y/o female with sickle cell crisis onset today, c/o LEFT CP and leg pain. Hx heart murmur, MI, PE, former smoker. EXAM: CHEST  2 VIEW COMPARISON:  10/19/2015 FINDINGS: Right central line tip overlies the level of superior vena cava. Heart size is normal. Lungs are clear. Visualized osseous structures have a normal appearance. Surgical clips are noted in the right upper quadrant the abdomen. IMPRESSION: No active cardiopulmonary disease. Electronically Signed   By: Norva PavlovElizabeth  Brown M.D.   On: 11/04/2015 18:10   Dg Chest 2 View  Result Date: 10/19/2015 CLINICAL DATA:  Pt c/o central chest pain radiating to left side onset x approx. 4 hours ago. Also c/o nausea and fever. H/o sickle cell and heart attack x 15 years ago. EXAM: CHEST  2 VIEW COMPARISON:  10/08/2015 FINDINGS: Cardiac silhouette is top-normal in size. No mediastinal or hilar masses or evidence of adenopathy. Lungs are clear.  No pleural effusion or pneumothorax. Right internal jugular central venous line catheter tip projects in the lower superior vena cava near the caval atrial junction. Bony thorax is unremarkable. IMPRESSION: No active cardiopulmonary disease. Electronically Signed   By: Amie Portlandavid  Ormond M.D.    On: 10/19/2015 13:56   Ct Angio Chest Pe W And/or Wo Contrast  Result Date: 10/19/2015 CLINICAL DATA:  Chest pain and shortness of breath. Fever. History sickle cell anemia. EXAM: CT ANGIOGRAPHY CHEST WITH CONTRAST TECHNIQUE: Multidetector CT imaging of the chest was performed using the standard protocol during bolus administration of intravenous contrast. Multiplanar CT image reconstructions and MIPs were obtained to evaluate the vascular anatomy. CONTRAST:  100 cc of Isovue 370 COMPARISON:  Chest radiograph of earlier today. Most recent CT 09/02/2015. FINDINGS: Cardiovascular: The quality of this exam for evaluation of pulmonary embolism is excellent. No evidence of pulmonary embolism. Normal aortic caliber without dissection. Heart size upper  normal. Trace pericardial fluid is likely physiologic. Catheter fragment again identified within the left brachiocephalic vein. A right-sided PICC line terminates at the low SVC. Mediastinum/Nodes: No mediastinal or hilar adenopathy. Minimal residual thymus in the anterior mediastinum. Lungs/Pleura: No pleural fluid.  Clear lungs. Upper Abdomen: Normal imaged portions of the liver, adrenal glands, kidneys. Hyperattenuation throughout the spleen is likely related to sickle cell anemia and prior infarcts. Musculoskeletal: Mildly increased density within the T7 and T9 vertebral bodies may relate to sickle cell anemia induced infarcts. This is similar. Review of the MIP images confirms the above findings. IMPRESSION: 1.  No evidence of pulmonary embolism. 2.  No acute process in the chest. 3. Catheter fragment in the left brachiocephalic vein, unchanged including back to 2015. Electronically Signed   By: Jeronimo Greaves M.D.   On: 10/19/2015 17:02    Scheduled Meds: . ciprofloxacin  500 mg Oral BID  . DULoxetine  60 mg Oral Daily  . enoxaparin (LOVENOX) injection  40 mg Subcutaneous QHS  . famotidine  20 mg Oral BID  . folic acid  1 mg Oral Daily  . HYDROmorphone    Intravenous Q4H  . hydroxyurea  500 mg Oral Daily  . morphine  30 mg Oral Q12H  . senna-docusate  1 tablet Oral BID  . sodium chloride flush  3 mL Intravenous Q12H  . topiramate  100 mg Oral QHS   Continuous Infusions: . 0.45 % NaCl with KCl 20 mEq / L 10 mL/hr at 11/08/15 2250    Principal Problem:   Sickle cell pain crisis (HCC) Active Problems:   Neuropathy (HCC)   Depression   Hypokalemia   Chronic pain syndrome   Chest pain   Leukocytosis    In excess of 25 minutes was spent during this visit. Greater than 50% involved face to face contact with the patient for assessment, counseling and coordination of care.

## 2015-11-11 ENCOUNTER — Telehealth: Payer: Self-pay

## 2015-11-11 LAB — URINALYSIS, ROUTINE W REFLEX MICROSCOPIC
Bilirubin Urine: NEGATIVE
GLUCOSE, UA: NEGATIVE mg/dL
Ketones, ur: NEGATIVE mg/dL
NITRITE: NEGATIVE
PH: 7 (ref 5.0–8.0)
Protein, ur: NEGATIVE mg/dL
SPECIFIC GRAVITY, URINE: 1.013 (ref 1.005–1.030)

## 2015-11-11 LAB — CBC WITH DIFFERENTIAL/PLATELET
BASOS ABS: 0 10*3/uL (ref 0.0–0.1)
Basophils Relative: 0 %
EOS ABS: 0.4 10*3/uL (ref 0.0–0.7)
Eosinophils Relative: 3 %
HCT: 24 % — ABNORMAL LOW (ref 36.0–46.0)
HEMOGLOBIN: 8.3 g/dL — AB (ref 12.0–15.0)
LYMPHS ABS: 4.3 10*3/uL — AB (ref 0.7–4.0)
LYMPHS PCT: 40 %
MCH: 34 pg (ref 26.0–34.0)
MCHC: 34.6 g/dL (ref 30.0–36.0)
MCV: 98.4 fL (ref 78.0–100.0)
Monocytes Absolute: 1 10*3/uL (ref 0.1–1.0)
Monocytes Relative: 9 %
NEUTROS PCT: 48 %
Neutro Abs: 5.1 10*3/uL (ref 1.7–7.7)
PLATELETS: 276 10*3/uL (ref 150–400)
RBC: 2.44 MIL/uL — AB (ref 3.87–5.11)
RDW: 15.8 % — ABNORMAL HIGH (ref 11.5–15.5)
WBC: 10.8 10*3/uL — AB (ref 4.0–10.5)

## 2015-11-11 LAB — COMPREHENSIVE METABOLIC PANEL
ALK PHOS: 88 U/L (ref 38–126)
ALT: 40 U/L (ref 14–54)
AST: 66 U/L — AB (ref 15–41)
Albumin: 4.3 g/dL (ref 3.5–5.0)
Anion gap: 7 (ref 5–15)
BUN: 8 mg/dL (ref 6–20)
CALCIUM: 8.9 mg/dL (ref 8.9–10.3)
CHLORIDE: 109 mmol/L (ref 101–111)
CO2: 21 mmol/L — AB (ref 22–32)
CREATININE: 0.41 mg/dL — AB (ref 0.44–1.00)
GFR calc non Af Amer: >60 mL/min (ref >60)
GLUCOSE: 128 mg/dL — AB (ref 65–99)
Potassium: 3.9 mmol/L (ref 3.5–5.1)
SODIUM: 137 mmol/L (ref 135–145)
Total Bilirubin: 2.1 mg/dL — ABNORMAL HIGH (ref 0.3–1.2)
Total Protein: 7.7 g/dL (ref 6.5–8.1)

## 2015-11-11 LAB — URINE MICROSCOPIC-ADD ON

## 2015-11-11 LAB — RETICULOCYTES
RBC.: 2.39 MIL/uL — AB (ref 3.87–5.11)
Retic Count, Absolute: 153 10*3/uL (ref 19.0–186.0)
Retic Ct Pct: 6.4 % — ABNORMAL HIGH (ref 0.4–3.1)

## 2015-11-11 MED ORDER — HEPARIN SOD (PORK) LOCK FLUSH 100 UNIT/ML IV SOLN
250.0000 [IU] | INTRAVENOUS | Status: DC | PRN
  Administered 2015-11-11: 250 [IU]
  Filled 2015-11-11: qty 3

## 2015-11-11 MED ORDER — OXYCODONE HCL 5 MG PO TABS
5.0000 mg | ORAL_TABLET | ORAL | Status: DC
  Administered 2015-11-11 (×2): 5 mg via ORAL
  Filled 2015-11-11 (×2): qty 1

## 2015-11-11 MED ORDER — OXYCODONE-ACETAMINOPHEN 5-325 MG PO TABS
1.0000 | ORAL_TABLET | ORAL | Status: DC
  Administered 2015-11-11 (×2): 1 via ORAL
  Filled 2015-11-11 (×2): qty 1

## 2015-11-11 MED ORDER — HEPARIN SOD (PORK) LOCK FLUSH 100 UNIT/ML IV SOLN
250.0000 [IU] | Freq: Every day | INTRAVENOUS | Status: DC
  Filled 2015-11-11: qty 3

## 2015-11-11 NOTE — Telephone Encounter (Signed)
Refill request for oxycodone and MS Contin. Please advise. Thanks!

## 2015-11-11 NOTE — Discharge Summary (Signed)
Brittany CampbellMiranda Paprocki MRN: [id number] DOB/AGE: 31/03/1985 31 y.o.  Admit date: 11/04/2015 Discharge date: 11/11/2015  Primary Care Physician:  Jeanann LewandowskyJEGEDE, OLUGBEMIGA, MD   Discharge Diagnoses:   Patient Active Problem List   Diagnosis Date Noted  . Vasoocclusive sickle cell crisis (HCC) 08/21/2015  . Depression, major, recurrent (HCC)   . Family planning, Depo-Provera contraception monitoring/administration 08/08/2015  . Sickle cell anemia with crisis (HCC) 07/12/2015  . Sickle cell anemia with pain (HCC) 05/28/2015  . Intractable pain   . Chest pain 03/07/2015  . Leukocytosis 03/07/2015  . Vitamin D deficiency 01/08/2015  . Chronic pain syndrome 01/08/2015  . Sickle cell crisis (HCC) 10/22/2014  . Hypokalemia 08/21/2014  . Depression 08/09/2014  . 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 Sickle cell disease with crisis (HCC) 07/12/2014  . Hb-SS disease with crisis (HCC) 06/11/2014  . Pericardial effusion   . Anemia 02/09/2014  . Neuropathy (HCC) 02/09/2014  . Chronic headache disorder 02/09/2014  . Abnormal CT scan, chest   . Sickle cell pain crisis (HCC) 12/30/2013    DISCHARGE MEDICATION:   Medication List    TAKE these medications   DULoxetine 60 MG capsule Commonly known as:  CYMBALTA Take 1 capsule (60 mg total) by mouth daily.   folic acid 1 MG tablet Commonly known as:  FOLVITE Take 1 tablet (1 mg total) by mouth daily.   hydroxyurea 500 MG capsule Commonly known as:  HYDREA TAKE 2 CAPSULES BY MOUTH DAILY. MAY TAKE WITH FOOD TO MINIMIZE GI SIDE EFFECTS. What changed:  how much to take  how to take this  when to take this  additional instructions   ibuprofen 200 MG tablet Commonly known as:  ADVIL,MOTRIN Take 400 mg by mouth every 6 (six) hours as needed for moderate pain.   morphine 30 MG 12 hr tablet Commonly known as:  MS CONTIN Take 1 tablet (30 mg total) by mouth every 12 (twelve) hours.   oxyCODONE-acetaminophen 10-325 MG tablet Commonly  known as:  PERCOCET Take 1 tablet by mouth every 4 (four) hours as needed for pain.   Topiramate ER 100 MG Cp24 Take 100 mg by mouth at bedtime.         Consults:    SIGNIFICANT DIAGNOSTIC STUDIES:  Dg Chest 2 View  Result Date: 11/04/2015 CLINICAL DATA:  30 y/o female with sickle cell crisis onset today, c/o LEFT CP and leg pain. Hx heart murmur, MI, PE, former smoker. EXAM: CHEST  2 VIEW COMPARISON:  10/19/2015 FINDINGS: Right central line tip overlies the level of superior vena cava. Heart size is normal. Lungs are clear. Visualized osseous structures have a normal appearance. Surgical clips are noted in the right upper quadrant the abdomen. IMPRESSION: No active cardiopulmonary disease. Electronically Signed   By: Norva PavlovElizabeth  Brown M.D.   On: 11/04/2015 18:10   Dg Chest 2 View  Result Date: 10/19/2015 CLINICAL DATA:  Pt c/o central chest pain radiating to left side onset x approx. 4 hours ago. Also c/o nausea and fever. H/o sickle cell and heart attack x 15 years ago. EXAM: CHEST  2 VIEW COMPARISON:  10/08/2015 FINDINGS: Cardiac silhouette is top-normal in size. No mediastinal or hilar masses or evidence of adenopathy. Lungs are clear.  No pleural effusion or pneumothorax. Right internal jugular central venous line catheter tip projects in the lower superior vena cava near the caval atrial junction. Bony thorax is unremarkable. IMPRESSION: No active cardiopulmonary disease. Electronically Signed   By: Renard Hamperavid  Ormond M.D.  On: 10/19/2015 13:56   Ct Angio Chest Pe W And/or Wo Contrast  Result Date: 10/19/2015 CLINICAL DATA:  Chest pain and shortness of breath. Fever. History sickle cell anemia. EXAM: CT ANGIOGRAPHY CHEST WITH CONTRAST TECHNIQUE: Multidetector CT imaging of the chest was performed using the standard protocol during bolus administration of intravenous contrast. Multiplanar CT image reconstructions and MIPs were obtained to evaluate the vascular anatomy. CONTRAST:  100 cc of  Isovue 370 COMPARISON:  Chest radiograph of earlier today. Most recent CT 09/02/2015. FINDINGS: Cardiovascular: The quality of this exam for evaluation of pulmonary embolism is excellent. No evidence of pulmonary embolism. Normal aortic caliber without dissection. Heart size upper normal. Trace pericardial fluid is likely physiologic. Catheter fragment again identified within the left brachiocephalic vein. A right-sided PICC line terminates at the low SVC. Mediastinum/Nodes: No mediastinal or hilar adenopathy. Minimal residual thymus in the anterior mediastinum. Lungs/Pleura: No pleural fluid.  Clear lungs. Upper Abdomen: Normal imaged portions of the liver, adrenal glands, kidneys. Hyperattenuation throughout the spleen is likely related to sickle cell anemia and prior infarcts. Musculoskeletal: Mildly increased density within the T7 and T9 vertebral bodies may relate to sickle cell anemia induced infarcts. This is similar. Review of the MIP images confirms the above findings. IMPRESSION: 1.  No evidence of pulmonary embolism. 2.  No acute process in the chest. 3. Catheter fragment in the left brachiocephalic vein, unchanged including back to 2015. Electronically Signed   By: Jeronimo Greaves M.D.   On: 10/19/2015 17:02      No results found for this or any previous visit (from the past 240 hour(s)).  BRIEF ADMITTING H & P: Amnah Breuer is a 30 y.o. female with medical history significant of sickle cell anemia, depression, migraine headache, chronic pain syndrome, neuropathy who comes to the ER with complaints of sickle cell pain crisis.  Per patient, she woke up around 2-3 in the morning Sunday with body aches, chills, nausea, headache, mild dyspnea, left lower rib cage pain which have persisted through today despite she using her MS Contin, oxycodone and trying to stay hydrated. She denies fever, sore throat, cough, palpitations, dizziness, diaphoresis, pitting edema of the lower extremities or orthopnea.  She complains of one episode of emesis yesterday, mild diarrhea, but denies melena, hematochezia, dysuria, frequency or hematuria.  ED Course: The patient received IV fluids, analgesics, antihistamine and antiemetics reporting relief of her symptoms. Workup shows a hemoglobin level of 8.6 g/dL, leukocytosis of 16.1 K, reticulocyte count of 10.6%, potassium of 2.9 mmol per liter, bilirubin 1.6 mg/dL.   Hospital Course:  Present on Admission: Opiate tolerant patient with chronic pain and  Hb SS admitted with crisis. Pain was managed with IV Dilaudid via PCA and intermittent clinician assisted doses. Patient also received Toradol and IVF. As pain improved IV pain medications were weaned and she was transitioned to oral analgesics. Pt also had hypokalemia and her potassium was replaced orally and by IV. She was also diagnosed with a UTI and started on ciprofloxacin. She received 2 days of therapy by IV then refused the antibiotic after that. A repeat urinalysis was ordered for [facility] given her refusal of therapy. The U/A still shows leukocytes in the urine. Will send for culture and follow up with PMD.   Disposition and Follow-up:  Pt discharged home in good condition and should follow up with her PMD    DISCHARGE EXAM:  General: Alert, awake, oriented x3, in mild distress.  Vital Signs: BP 90/66, pulse 88,  T 98.6 F (37 C), temperature source Oral, RR 15, height 5' (1.524 m), weight 56.7 kg (125 lb), SpO2 99 %.  HEENT: [place]/AT PEERL, EOMI, anicteric Neck: Trachea midline, no masses, no thyromegal,y no JVD, no carotid bruit OROPHARYNX: Moist, No exudate/ erythema/lesions.  Heart: Regular rate and rhythm, without murmurs, rubs, gallops or S3. PMI non-displaced. Exam reveals no decreased pulses. Pulmonary/Chest: Normal effort. Breath sounds normal. No. Apnea. Clear to auscultation,no stridor,  no wheezing and no rhonchi noted. No respiratory distress and no tenderness noted. Abdomen: Soft, nontender,  nondistended, normal bowel sounds, no masses no hepatosplenomegaly noted. No fluid wave and no ascites. There is no guarding or rebound. Neuro: Alert and oriented to person, place and time. Normal motor skills, Displays no atrophy or tremors and exhibits normal muscle tone.  No focal neurological deficits noted cranial nerves II through XII grossly intact. No sensory deficit noted. Strength at baseline in bilateral upper and lower extremities. Gait normal. Musculoskeletal: No warmth swelling or erythema around joints, no spinal tenderness noted.Psychiatric: Patient alert and oriented x3, good insight and cognition. Mood, memory, affect and judgement normal Skin: Skin is warm and dry. No bruising, no ecchymosis and no rash noted. Pt is not diaphoretic. No erythema. No pallor      Recent Labs  11/08/15 1450 11/11/15 0404  NA 141 137  K 3.9 3.9  CL 113* 109  CO2 23 21*  GLUCOSE 123* 128*  BUN 8 8  CREATININE 0.54 0.41*  CALCIUM 8.8* 8.9    Recent Labs  11/11/15 0404  AST 66*  ALT 40  ALKPHOS 88  BILITOT 2.1*  PROT 7.7  ALBUMIN 4.3   No results for input(s): LIPASE, AMYLASE in the last 72 hours.  Recent Labs  11/08/15 1450 11/11/15 0404  WBC 10.9* 10.8*  NEUTROABS 4.9 5.1  HGB 7.9* 8.3*  HCT 23.0* 24.0*  MCV 99.1 98.4  PLT 299 276     Total time spent including face to face and decision making was greater than 30 minutes  Signed: Wen Merced A. 11/11/2015, 10:40 AM

## 2015-11-11 NOTE — Progress Notes (Signed)
   11/11/15 1500  Clinical Encounter Type  Visited With Patient  Visit Type Follow-up;Psychological support  Counseling intern visited briefly with patient. Patient appeared alert and in a better mood than during previous visit three days ago. When intern noted this, patient stated she had requested to be discharged and was looking forward to going home later in the afternoon.   Everlean AlstromShaunta Giovany Cosby, Counseling Intern Department for Spiritual Care and [place] [facility]ol[facility] Supervisor - [address]Matt Stalnaker, [place], [place]

## 2015-11-12 ENCOUNTER — Telehealth (HOSPITAL_COMMUNITY): Payer: Self-pay | Admitting: Hematology

## 2015-11-12 LAB — URINE CULTURE: CULTURE: NO GROWTH

## 2015-11-12 NOTE — Telephone Encounter (Signed)
Patient called in asking for her refill of oxycodone.  I explained to patient that the last prescription was written on 11-01-2015 for 60 tablets, Q4hrs PRN. This prescription would last patient 10 days.  However, patient was then admitted in the hospital from 11-04-2015-11-11-2015 (7 days).  Explained to patient that she should have Oxycodone still remaining from this prescription. Patient then asked about MS Contin.  Went over the same information with her.  That prescription was written on 10/25/2015 for 30 tablets which would last 15 days.  However, she was in the hospital from 11-04-2015-11-11-2015 so she should have some medication remaining.  Discussed the prescription refill policy with patient about calling in advance and prescription pick up days.  After verbalizing understanding, patient disconnected the phone.

## 2015-11-14 ENCOUNTER — Telehealth (HOSPITAL_COMMUNITY): Payer: Self-pay | Admitting: *Deleted

## 2015-11-14 ENCOUNTER — Non-Acute Institutional Stay (HOSPITAL_COMMUNITY)
Admission: AD | Admit: 2015-11-14 | Discharge: 2015-11-14 | Disposition: A | Discharge status: Home / Self Care | Payer: Medicaid Other | Source: Ambulatory Visit | Attending: Internal Medicine | Admitting: Internal Medicine

## 2015-11-14 ENCOUNTER — Encounter (HOSPITAL_COMMUNITY): Payer: Self-pay | Admitting: *Deleted

## 2015-11-14 DIAGNOSIS — D5700 Hb-SS disease with crisis, unspecified: Secondary | ICD-10-CM | POA: Diagnosis not present

## 2015-11-14 DIAGNOSIS — G43909 Migraine, unspecified, not intractable, without status migrainosus: Secondary | ICD-10-CM | POA: Insufficient documentation

## 2015-11-14 DIAGNOSIS — F339 Major depressive disorder, recurrent, unspecified: Secondary | ICD-10-CM | POA: Diagnosis not present

## 2015-11-14 MED ORDER — PROMETHAZINE HCL 12.5 MG RE SUPP
12.5000 mg | RECTAL | Status: DC | PRN
  Filled 2015-11-14: qty 2

## 2015-11-14 MED ORDER — SODIUM CHLORIDE 0.9% FLUSH: 9.0000 mL | INTRAVENOUS | Status: DC | PRN

## 2015-11-14 MED ORDER — PROMETHAZINE HCL 25 MG PO TABS: 12.5000 mg | ORAL_TABLET | ORAL | Status: DC | PRN

## 2015-11-14 MED ORDER — HYDROMORPHONE 1 MG/ML IV SOLN
INTRAVENOUS | Status: DC
  Administered 2015-11-14: 12:00:00 via INTRAVENOUS
  Administered 2015-11-14: 9.5 mg via INTRAVENOUS
  Filled 2015-11-14: qty 25

## 2015-11-14 MED ORDER — HYDROMORPHONE HCL 2 MG/ML IJ SOLN
2.0000 mg | Freq: Once | INTRAMUSCULAR | Status: AC
  Administered 2015-11-14: 2 mg via INTRAVENOUS
  Filled 2015-11-14: qty 1

## 2015-11-14 MED ORDER — POLYETHYLENE GLYCOL 3350 17 G PO PACK: 17.0000 g | PACK | Freq: Every day | ORAL | Status: DC | PRN

## 2015-11-14 MED ORDER — DEXTROSE-NACL 5-0.45 % IV SOLN
INTRAVENOUS | Status: DC
  Administered 2015-11-14: 12:00:00 via INTRAVENOUS

## 2015-11-14 MED ORDER — DIPHENHYDRAMINE HCL 25 MG PO CAPS
25.0000 mg | ORAL_CAPSULE | ORAL | Status: DC | PRN
  Administered 2015-11-14: 50 mg via ORAL
  Filled 2015-11-14: qty 2

## 2015-11-14 MED ORDER — SODIUM CHLORIDE 0.9% FLUSH: 10.0000 mL | INTRAVENOUS | Status: DC | PRN

## 2015-11-14 MED ORDER — HEPARIN SOD (PORK) LOCK FLUSH 100 UNIT/ML IV SOLN
500.0000 [IU] | INTRAVENOUS | Status: AC | PRN
  Administered 2015-11-14: 500 [IU]
  Filled 2015-11-14 (×2): qty 5

## 2015-11-14 MED ORDER — NALOXONE HCL 0.4 MG/ML IJ SOLN: 0.4000 mg | INTRAMUSCULAR | Status: DC | PRN

## 2015-11-14 MED ORDER — HEPARIN SOD (PORK) LOCK FLUSH 100 UNIT/ML IV SOLN: 250.0000 [IU] | INTRAVENOUS | Status: DC | PRN

## 2015-11-14 MED ORDER — KETOROLAC TROMETHAMINE 30 MG/ML IJ SOLN
30.0000 mg | Freq: Four times a day (QID) | INTRAMUSCULAR | Status: DC
  Administered 2015-11-14: 30 mg via INTRAVENOUS
  Filled 2015-11-14: qty 1

## 2015-11-14 MED ORDER — SODIUM CHLORIDE 0.9 % IV SOLN: 25.0000 mg | INTRAVENOUS | Status: DC | PRN

## 2015-11-14 MED ORDER — SENNOSIDES-DOCUSATE SODIUM 8.6-50 MG PO TABS: 1.0000 | ORAL_TABLET | Freq: Two times a day (BID) | ORAL | Status: DC

## 2015-11-14 MED ORDER — SODIUM CHLORIDE 0.9% FLUSH
10.0000 mL | INTRAVENOUS | Status: AC | PRN
  Administered 2015-11-14: 10 mL

## 2015-11-14 NOTE — Discharge Instructions (Signed)
Sickle Cell Anemia, Adult Sickle cell anemia is a condition in which red blood cells have an abnormal "sickle" shape. This abnormal shape shortens the cells' life span, which results in a lower than normal concentration of red blood cells in the blood. The sickle shape also causes the cells to clump together and block free blood flow through the blood vessels. As a result, the tissues and organs of the body do not receive enough oxygen. Sickle cell anemia causes organ damage and pain and increases the risk of infection. CAUSES  Sickle cell anemia is a genetic disorder. Those who receive two copies of the gene have the condition, and those who receive one copy have the trait. RISK FACTORS The sickle cell gene is most common in people whose families originated in [place]. Other areas of the globe where sickle cell trait occurs include the Mediterranean, [place] and [place], the [place], and the [place].  SIGNS AND SYMPTOMS  Pain, especially in the extremities, back, chest, or abdomen (common). The pain may start suddenly or may develop following an illness, especially if there is dehydration. Pain can also occur due to overexertion or exposure to extreme temperature changes.  Frequent severe bacterial infections, especially certain types of pneumonia and meningitis.  Pain and swelling in the hands and feet.  Decreased activity.   Loss of appetite.   Change in behavior.  Headaches.  Seizures.  Shortness of breath or difficulty breathing.  Vision changes.  Skin ulcers. Those with the trait may not have symptoms or they may have mild symptoms.  DIAGNOSIS  Sickle cell anemia is diagnosed with blood tests that demonstrate the genetic trait. It is often diagnosed during the newborn period, due to mandatory testing nationwide. A variety of blood tests, X-rays, CT scans, MRI scans, ultrasounds, and lung function tests may also be done to monitor the condition. TREATMENT  Sickle  cell anemia may be treated with:  Medicines. You may be given pain medicines, antibiotic medicines (to treat and prevent infections) or medicines to increase the production of certain types of hemoglobin.  Fluids.  Oxygen.  Blood transfusions. HOME CARE INSTRUCTIONS   Drink enough fluid to keep your urine clear or pale yellow. Increase your fluid intake in hot weather and during exercise.  Do not smoke. Smoking lowers oxygen levels in the blood.   Only take over-the-counter or prescription medicines for pain, fever, or discomfort as directed by your health care provider.  Take antibiotics as directed by your health care provider. Make sure you finish them it even if you start to feel better.   Take supplements as directed by your health care provider.   Consider wearing a medical alert bracelet. This tells anyone caring for you in an emergency of your condition.   When traveling, keep your medical information, health care provider's names, and the medicines you take with you at all times.   If you develop a fever, do not take medicines to reduce the fever right away. This could cover up a problem that is developing. Notify your health care provider.  Keep all follow-up appointments with your health care provider. Sickle cell anemia requires regular medical care. SEEK MEDICAL CARE IF: You have a fever. SEEK IMMEDIATE MEDICAL CARE IF:   You feel dizzy or faint.   You have new abdominal pain, especially on the left side near the stomach area.   You develop a persistent, often uncomfortable and painful penile erection (priapism). If this is not treated immediately it   will lead to impotence.   You have numbness your arms or legs or you have a hard time moving them.   You have a hard time with speech.   You have a fever or persistent symptoms for more than 2-3 days.   You have a fever and your symptoms suddenly get worse.   You have signs or symptoms of infection.  These include:   Chills.   Abnormal tiredness (lethargy).   Irritability.   Poor eating.   Vomiting.   You develop pain that is not helped with medicine.   You develop shortness of breath.  You have pain in your chest.   You are coughing up pus-like or bloody sputum.   You develop a stiff neck.  Your feet or hands swell or have pain.  Your abdomen appears bloated.  You develop joint pain. MAKE SURE YOU:  Understand these instructions.   This information is not intended to replace advice given to you by your health care provider. Make sure you discuss any questions you have with your health care provider.   Document Released: 05/27/2005 Document Revised: 03/09/2014 Document Reviewed: 09/28/2012 Elsevier Interactive Patient Education 2016 [employer]  

## 2015-11-14 NOTE — Discharge Summary (Signed)
PCP: Jeanann LewandowskyJEGEDE, OLUGBEMIGA, MD  Admit date: 11/14/2015 Discharge date: 11/14/2015  Time spent: 35 min   Discharge Diagnoses:  Active Problems:   * No active hospital problems. *   Discharge Condition: Stable Diet recommendation: Regular  Filed Weights   11/14/15 1121  Weight: 125 lb (56.7 kg)    History of present illness:   Brittany CampbellMiranda Foster is a 30 y.o. female with history of sickle cell disease. Who presents for admission for pain management. She reports this episode of pain stared at 4 am today. And is 9/10. Her pain is in her back, hips and legs. She reports taking her last long-acting narcotic on 9/12 and her last short-acting at 9 pm yesterday. She reports there is nothing different about this episode compared to previous episodes.She reports no additional symptoms except for weakness of her legs (see ROS below  Hospital Course:  Patient was admitted to the day hospital with sickle cell painful crisis. She was treated with IV Dilaudid PCA, Toradol as well as IV fluids. By 4pm her pain was still described at 8/20 and she was given Dilaudid 2 mg IV push.  Her pain is still not controlled at discharge. She return tomorrow if necessary. She is to follow-up in the clinic as previously scheduled. And she is to continue with her home medications as per prior to admission.  Discharge Exam: Vitals:   11/14/15 1247 11/14/15 1447  BP: 98/66 (!) 99/59  Pulse: 87 84  Resp: 16 16  Temp:      General appearance: alert, cooperative and no distress Eyes: conjunctivae/corneas clear. PERRL, EOM's intact. Fundi benign. Neck: no adenopathy, no carotid bruit, no JVD, supple, symmetrical, trachea midline and thyroid not enlarged, symmetric, no tenderness/mass/nodules Back: symmetric, no curvature. ROM normal. No CVA tenderness. Resp: clear to auscultation bilaterally Chest wall: no tenderness Cardio: regular rate and rhythm, S1, S2 normal, no murmur, click, rub or gallop GI: soft, non-tender;  bowel sounds normal; no masses, no organomegaly Extremities: extremities normal, atraumatic, no cyanosis or edema Pulses: 2+ and symmetric Skin: Skin color, texture, turgor normal. No rashes or lesions Neurologic: Grossly normal  Discharge Instructions    Current Discharge Medication List    CONTINUE these medications which have NOT CHANGED   Details  DULoxetine (CYMBALTA) 60 MG capsule Take 1 capsule (60 mg total) by mouth daily. Qty: 30 capsule, Refills: 3   Associated Diagnoses: Chronic pain syndrome    folic acid (FOLVITE) 1 MG tablet Take 1 tablet (1 mg total) by mouth daily. 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 MAY TAKE WITH FOOD TO MINIMIZE GI SIDE EFFECTS. Qty: 60 capsule, Refills: 3   Associated Diagnoses: Hb-SS disease without crisis (HCC)    ibuprofen (ADVIL,MOTRIN) 200 MG tablet Take 400 mg by mouth every 6 (six) hours as needed for moderate pain.     morphine (MS CONTIN) 30 MG 12 hr tablet Take 1 tablet (30 mg total) by mouth every 12 (twelve) hours. Qty: 30 tablet, Refills: 0   Associated Diagnoses: Hb-SS disease without crisis (HCC)    oxyCODONE-acetaminophen (PERCOCET) 10-325 MG tablet Take 1 tablet by mouth every 4 (four) hours as needed for pain. Qty: 60 tablet, Refills: 0    Topiramate ER 100 MG CP24 Take 100 mg by mouth at bedtime.       Allergies  Allergen Reactions  . Ultram [Tramadol] Other (  See Comments)    seizures  . Zofran [Ondansetron Hcl] Nausea And Vomiting  . Buprenorphine Hcl Hives and Rash    Shaking Tolerates Percocet, Norco, and buprenorphine  . Morphine And Related Hives, Rash and Other (See Comments)    Shaking Tolerates Percocet, Norco, Dilaudid, and buprenorphine  . Tape Rash      The results of significant diagnostics from this hospitalization (including imaging, microbiology, ancillary and laboratory)  are listed below for reference.    Significant Diagnostic Studies: Dg Chest 2 View  Result Date: 11/04/2015 CLINICAL DATA:  30 y/o female with sickle cell crisis onset today, c/o LEFT CP and leg pain. Hx heart murmur, MI, PE, former smoker. EXAM: CHEST  2 VIEW COMPARISON:  10/19/2015 FINDINGS: Right central line tip overlies the level of superior vena cava. Heart size is normal. Lungs are clear. Visualized osseous structures have a normal appearance. Surgical clips are noted in the right upper quadrant the abdomen. IMPRESSION: No active cardiopulmonary disease. Electronically Signed   By: Norva Pavlov M.D.   On: 11/04/2015 18:10   Dg Chest 2 View  Result Date: 10/19/2015 CLINICAL DATA:  Pt c/o central chest pain radiating to left side onset x approx. 4 hours ago. Also c/o nausea and fever. H/o sickle cell and heart attack x 15 years ago. EXAM: CHEST  2 VIEW COMPARISON:  10/08/2015 FINDINGS: Cardiac silhouette is top-normal in size. No mediastinal or hilar masses or evidence of adenopathy. Lungs are clear.  No pleural effusion or pneumothorax. Right internal jugular central venous line catheter tip projects in the lower superior vena cava near the caval atrial junction. Bony thorax is unremarkable. IMPRESSION: No active cardiopulmonary disease. Electronically Signed   By: Amie Portland M.D.   On: 10/19/2015 13:56   Ct Angio Chest Pe W And/or Wo Contrast  Result Date: 10/19/2015 CLINICAL DATA:  Chest pain and shortness of breath. Fever. History sickle cell anemia. EXAM: CT ANGIOGRAPHY CHEST WITH CONTRAST TECHNIQUE: Multidetector CT imaging of the chest was performed using the standard protocol during bolus administration of intravenous contrast. Multiplanar CT image reconstructions and MIPs were obtained to evaluate the vascular anatomy. CONTRAST:  100 cc of Isovue 370 COMPARISON:  Chest radiograph of earlier today. Most recent CT 09/02/2015. FINDINGS: Cardiovascular: The quality of this exam for  evaluation of pulmonary embolism is excellent. No evidence of pulmonary embolism. Normal aortic caliber without dissection. Heart size upper normal. Trace pericardial fluid is likely physiologic. Catheter fragment again identified within the left brachiocephalic vein. A right-sided PICC line terminates at the low SVC. Mediastinum/Nodes: No mediastinal or hilar adenopathy. Minimal residual thymus in the anterior mediastinum. Lungs/Pleura: No pleural fluid.  Clear lungs. Upper Abdomen: Normal imaged portions of the liver, adrenal glands, kidneys. Hyperattenuation throughout the spleen is likely related to sickle cell anemia and prior infarcts. Musculoskeletal: Mildly increased density within the T7 and T9 vertebral bodies may relate to sickle cell anemia induced infarcts. This is similar. Review of the MIP images confirms the above findings. IMPRESSION: 1.  No evidence of pulmonary embolism. 2.  No acute process in the chest. 3. Catheter fragment in the left brachiocephalic vein, unchanged including back to 2015. Electronically Signed   By: Jeronimo Greaves M.D.   On: 10/19/2015 17:02    Microbiology: Recent Results (from the past 240 hour(s))  Culture, Urine     Status: None   Collection Time: 11/11/15 12:07 PM  Result Value Ref Range Status   Specimen Description URINE, CLEAN CATCH  Final  Special Requests NONE  Final   Culture NO GROWTH Performed at [facility]   Final   Report Status 11/12/2015 FINAL  Final     Labs: Basic Metabolic Panel:  Recent Labs Lab 11/08/15 1450 11/11/15 0404  NA 141 137  K 3.9 3.9  CL 113* 109  CO2 23 21*  GLUCOSE 123* 128*  BUN 8 8  CREATININE 0.54 0.41*  CALCIUM 8.8* 8.9   Liver Function Tests:  Recent Labs Lab 11/11/15 0404  AST 66*  ALT 40  ALKPHOS 88  BILITOT 2.1*  PROT 7.7  ALBUMIN 4.3   No results for input(s): LIPASE, AMYLASE in the last 168 hours. No results for input(s): AMMONIA in the last 168 hours. CBC:  Recent Labs Lab  11/08/15 1450 11/11/15 0404  WBC 10.9* 10.8*  NEUTROABS 4.9 5.1  HGB 7.9* 8.3*  HCT 23.0* 24.0*  MCV 99.1 98.4  PLT 299 276   Cardiac Enzymes: No results for input(s): CKTOTAL, CKMB, CKMBINDEX, TROPONINI in the last 168 hours. BNP: BNP (last 3 results) No results for input(s): BNP in the last 8760 hours.  ProBNP (last 3 results) No results for input(s): PROBNP in the last 8760 hours.  CBG: No results for input(s): GLUCAP in the last 168 hours.     Signed:  Concepcion Living MSN,FNP,BC

## 2015-11-14 NOTE — Telephone Encounter (Signed)
Called patient back after speaking with Bonita QuinLinda, NP to let her know that she can come for treatment. Pt stated that she could be here around 9 or 9:30.

## 2015-11-14 NOTE — Telephone Encounter (Signed)
[facility]. Pt states her pain is 8/10 and that she had her last pain med last night because she is out of her pain medicine. Pt states her pain is in her hips, legs, and back. Pt denies fever, chest pain, abd pain, nausea, vomiting or diarrhea. Will check with the provider and give her a call back. Pt voiced understanding.

## 2015-11-14 NOTE — H&P (Signed)
[facility] History and Physical  Brittany CampbellMiranda Foster WUJ:[id number] DOB: 07/13/1985 DOA: 11/14/2015  PCP: Jeanann LewandowskyJEGEDE, Hertha Gergen, Foster   Chief Complaint: Sickle Cell Pain Crisis.  HPI: Brittany CampbellMiranda Foster is a 30 y.o. female with history of sickle cell disease. Who presents for admission for pain management. She reports this episode of pain stared at 4 am today. And is 9/10. Her pain is in her back, hips and legs. She reports taking her last long-acting narcotic on 9/12 and her last short-acting at 9 pm yesterday. She reports there is nothing different about this episode compared to previous episodes.She reports no additional symptoms except for weakness of her legs (see ROS below)  Systemic Review: General: The patient denies anorexia, fever, weight loss Cardiac: Denies chest pain, syncope, palpitations, pedal edema  Respiratory: Denies cough, shortness of breath, wheezing GI: Denies severe indigestion/heartburn, abdominal pain, nausea, vomiting, diarrhea and constipation GU: Denies hematuria, incontinence, dysuria  Musculoskeletal: Denies arthritis. Admits to weakness of legs Skin: Denies suspicious skin lesions Neurologic: Denies  numbness, change in vision  Past Medical History:  Diagnosis Date  . Anemia   . Depression, major, recurrent (HCC)   . Migraines   . Sickle cell anemia (HCC)     Past Surgical History:  Procedure Laterality Date  . CESAREAN SECTION    . CHOLECYSTECTOMY  2000  . IR GENERIC HISTORICAL  10/08/2015   IR US GUIDE VASC ACCESS RIGHT 10/08/2015 Simonne ComeJohn Watts, Foster [facility]  . IR GENERIC HISTORICAL  10/08/2015   IR FLUORO GUIDE CV LINE RIGHT 10/08/2015 Simonne ComeJohn Watts, Foster [facility]  . MULTIPLE TOOTH EXTRACTIONS N/A   . port a cath placement Right    about 6-7 years ago  . removal of porta cath Right 09/11/15  . TUBAL LIGATION      Allergies  Allergen Reactions  . Ultram [Tramadol] Other (See Comments)    seizures  . Zofran [Ondansetron Hcl] Nausea And Vomiting   . Buprenorphine Hcl Hives and Rash    Shaking Tolerates Percocet, Norco, and buprenorphine  . Morphine And Related Hives, Rash and Other (See Comments)    Shaking Tolerates Percocet, Norco, Dilaudid, and buprenorphine  . Tape Rash    Family History  Problem Relation Age of Onset  . Sickle cell trait Father   . Sickle cell trait Mother   . Sickle cell anemia Other       Prior to Admission medications   Medication Sig Start Date End Date Taking? Authorizing Provider  DULoxetine (CYMBALTA) 60 MG capsule Take 1 capsule (60 mg total) by mouth daily. 08/08/15   Massie MaroonLachina M [name], FNP  folic acid (FOLVITE) 1 MG tablet Take 1 tablet (1 mg total) by mouth daily. 08/08/15   Massie MaroonLachina M [name], FNP  hydroxyurea (HYDREA) 500 MG capsule TAKE 2 CAPSULES BY MOUTH DAILY. MAY TAKE WITH FOOD TO MINIMIZE GI SIDE EFFECTS. Patient taking differently: Take 500 mg by mouth daily. MAY TAKE WITH FOOD TO MINIMIZE GI SIDE EFFECTS. 08/08/15   Massie MaroonLachina M [name], FNP  ibuprofen (ADVIL,MOTRIN) 200 MG tablet Take 400 mg by mouth every 6 (six) hours as needed for moderate pain.     Historical Provider, Foster  morphine (MS CONTIN) 30 MG 12 hr tablet Take 1 tablet (30 mg total) by mouth every 12 (twelve) hours. 10/25/15   Henrietta HooverLinda C Bernhardt, NP  oxyCODONE-acetaminophen (PERCOCET) 10-325 MG tablet Take 1 tablet by mouth every 4 (four) hours as needed for pain. 11/01/15   Quentin Angstlugbemiga E Deloy Archey, Foster  Topiramate ER 100 MG CP24 Take 100 mg by mouth at bedtime.    Historical Provider, Foster     Physical Exam: Vitals:   11/14/15 1121  BP: 126/80  Pulse: 99  Resp: 20  Temp: 99.1 F (37.3 C)  TempSrc: Oral  SpO2: 98%  Weight: 125 lb (56.7 kg)  Height: 5' (1.524 m)    General: Alert, awake, afebrile, anicteric, not in obvious distress HEENT: Normocephalic and Atraumatic, Mucous membranes pink                PERRLA; EOM intact; No scleral icterus,                 Nares: Patent, Oropharynx: Clear, Fair Dentition                 Neck:  FROM, no cervical lymphadenopathy, thyromegaly, carotid bruit or JVD;  CHEST WALL: No tenderness  CHEST: Normal respiration, clear to auscultation bilaterally  HEART: Regular rate and rhythm; no murmurs rubs or gallops  BACK: No kyphosis or scoliosis; no CVA tenderness  ABDOMEN: Positive Bowel Sounds, soft, non-tender; no masses, no organomegaly EXTREMITIES: No cyanosis, clubbing, or edema SKIN:  no rash or ulceration  CNS: Alert and Oriented x 4, Nonfocal exam.  Labs on Admission:  Basic Metabolic Panel:  Recent Labs Lab 11/08/15 1450 11/11/15 0404  NA 141 137  K 3.9 3.9  CL 113* 109  CO2 23 21*  GLUCOSE 123* 128*  BUN 8 8  CREATININE 0.54 0.41*  CALCIUM 8.8* 8.9   Liver Function Tests:  Recent Labs Lab 11/11/15 0404  AST 66*  ALT 40  ALKPHOS 88  BILITOT 2.1*  PROT 7.7  ALBUMIN 4.3   No results for input(s): LIPASE, AMYLASE in the last 168 hours. No results for input(s): AMMONIA in the last 168 hours. CBC:  Recent Labs Lab 11/08/15 1450 11/11/15 0404  WBC 10.9* 10.8*  NEUTROABS 4.9 5.1  HGB 7.9* 8.3*  HCT 23.0* 24.0*  MCV 99.1 98.4  PLT 299 276   Cardiac Enzymes: No results for input(s): CKTOTAL, CKMB, CKMBINDEX, TROPONINI in the last 168 hours.  BNP (last 3 results) No results for input(s): BNP in the last 8760 hours.  ProBNP (last 3 results) No results for input(s): PROBNP in the last 8760 hours.  CBG: No results for input(s): GLUCAP in the last 168 hours.   Assessment/Plan Active Problems:   * No active hospital problems. *   Admits to the Day Hospital  IVF D5 .45% Saline @ 125 mls/hour  Weight based Dilaudid PCA started within 30 minutes of admission  IV Toradol 30 mg Q 6 H  Monitor vitals very closely, Re-evaluate pain scale every hour  2 L of Oxygen by [place]  Patient will be re-evaluated for pain in the context of function and relationship to baseline as care progresses.  If no significant relieve from pain (remains above 5/10)  will transfer patient to inpatient services for further evaluation and management  Code Status: Full  Family Communication: None  DVT Prophylaxis: Ambulate as tolerated   Time spent: 35 Minutes  BERNHARDT, LINDA  If 7PM-7AM, please contact night-coverage [email or web address] 11/14/2015, 11:29 AM   Evaluation and management procedures were performed by the Advanced Practitioner under my supervision and collaboration. I have reviewed the Advanced Practitioner's note and chart, and I agree with the management and plan.   Jeanann Lewandowsky, Foster, MHA, CPE, FACP, FAAP [facility] [place], [place] [phone]  11/15/2015, 11:07 AM

## 2015-11-14 NOTE — Progress Notes (Signed)
Pt discharged to home; discharge instructions explained, given, and signed; all questions answered; pt alert and oriented; ambulatory; no complications noted

## 2015-11-15 ENCOUNTER — Non-Acute Institutional Stay (HOSPITAL_COMMUNITY)
Admission: AD | Admit: 2015-11-15 | Discharge: 2015-11-15 | Disposition: A | Discharge status: Home / Self Care | Payer: Medicaid Other | Source: Ambulatory Visit | Attending: Internal Medicine | Admitting: Internal Medicine

## 2015-11-15 ENCOUNTER — Other Ambulatory Visit: Payer: Self-pay | Admitting: Internal Medicine

## 2015-11-15 ENCOUNTER — Telehealth (HOSPITAL_COMMUNITY): Payer: Self-pay | Admitting: *Deleted

## 2015-11-15 ENCOUNTER — Encounter (HOSPITAL_COMMUNITY): Payer: Self-pay | Admitting: *Deleted

## 2015-11-15 DIAGNOSIS — Z79899 Other long term (current) drug therapy: Secondary | ICD-10-CM | POA: Insufficient documentation

## 2015-11-15 DIAGNOSIS — D5700 Hb-SS disease with crisis, unspecified: Secondary | ICD-10-CM | POA: Diagnosis not present

## 2015-11-15 DIAGNOSIS — Z79891 Long term (current) use of opiate analgesic: Secondary | ICD-10-CM | POA: Insufficient documentation

## 2015-11-15 DIAGNOSIS — F339 Major depressive disorder, recurrent, unspecified: Secondary | ICD-10-CM | POA: Diagnosis not present

## 2015-11-15 LAB — CBC WITH DIFFERENTIAL/PLATELET
Basophils Absolute: 0 10*3/uL (ref 0.0–0.1)
Basophils Relative: 0 %
EOS ABS: 0 10*3/uL (ref 0.0–0.7)
Eosinophils Relative: 0 %
HEMATOCRIT: 22.8 % — AB (ref 36.0–46.0)
HEMOGLOBIN: 8 g/dL — AB (ref 12.0–15.0)
Lymphocytes Relative: 21 %
Lymphs Abs: 2.9 10*3/uL (ref 0.7–4.0)
MCH: 34.6 pg — AB (ref 26.0–34.0)
MCHC: 35.1 g/dL (ref 30.0–36.0)
MCV: 98.7 fL (ref 78.0–100.0)
MONOS PCT: 12 %
Monocytes Absolute: 1.6 10*3/uL — ABNORMAL HIGH (ref 0.1–1.0)
NEUTROS PCT: 67 %
Neutro Abs: 9.1 10*3/uL — ABNORMAL HIGH (ref 1.7–7.7)
Platelets: 443 10*3/uL — ABNORMAL HIGH (ref 150–400)
RBC: 2.31 MIL/uL — ABNORMAL LOW (ref 3.87–5.11)
RDW: 19.6 % — ABNORMAL HIGH (ref 11.5–15.5)
WBC: 13.7 10*3/uL — ABNORMAL HIGH (ref 4.0–10.5)

## 2015-11-15 LAB — RETICULOCYTES
RBC.: 2.31 MIL/uL — ABNORMAL LOW (ref 3.87–5.11)
Retic Count, Absolute: 422.7 10*3/uL — ABNORMAL HIGH (ref 19.0–186.0)
Retic Ct Pct: 18.3 % — ABNORMAL HIGH (ref 0.4–3.1)

## 2015-11-15 MED ORDER — NALOXONE HCL 0.4 MG/ML IJ SOLN: 0.4000 mg | INTRAMUSCULAR | Status: DC | PRN

## 2015-11-15 MED ORDER — HYDROMORPHONE 1 MG/ML IV SOLN
INTRAVENOUS | Status: DC
  Administered 2015-11-15: 10:00:00 via INTRAVENOUS
  Administered 2015-11-15: 6 mg via INTRAVENOUS
  Administered 2015-11-15: 9 mg via INTRAVENOUS
  Filled 2015-11-15: qty 25

## 2015-11-15 MED ORDER — DIPHENHYDRAMINE HCL 50 MG/ML IJ SOLN: 25.0000 mg | INTRAMUSCULAR | Status: DC | PRN

## 2015-11-15 MED ORDER — KETOROLAC TROMETHAMINE 30 MG/ML IJ SOLN
30.0000 mg | Freq: Four times a day (QID) | INTRAMUSCULAR | Status: DC
  Administered 2015-11-15: 30 mg via INTRAVENOUS
  Filled 2015-11-15: qty 1

## 2015-11-15 MED ORDER — POLYETHYLENE GLYCOL 3350 17 G PO PACK: 17.0000 g | PACK | Freq: Every day | ORAL | Status: DC | PRN

## 2015-11-15 MED ORDER — SODIUM CHLORIDE 0.45 % IV SOLN
INTRAVENOUS | Status: DC
  Administered 2015-11-15: 10:00:00 via INTRAVENOUS

## 2015-11-15 MED ORDER — SODIUM CHLORIDE 0.9% FLUSH
10.0000 mL | INTRAVENOUS | Status: AC | PRN
  Administered 2015-11-15: 10 mL

## 2015-11-15 MED ORDER — DIPHENHYDRAMINE HCL 25 MG PO CAPS: 25.0000 mg | ORAL_CAPSULE | ORAL | Status: DC | PRN

## 2015-11-15 MED ORDER — SENNOSIDES-DOCUSATE SODIUM 8.6-50 MG PO TABS: 1.0000 | ORAL_TABLET | Freq: Two times a day (BID) | ORAL | Status: DC

## 2015-11-15 MED ORDER — ONDANSETRON HCL 4 MG/2ML IJ SOLN: 4.0000 mg | Freq: Four times a day (QID) | INTRAMUSCULAR | Status: DC | PRN

## 2015-11-15 MED ORDER — SODIUM CHLORIDE 0.9% FLUSH: 9.0000 mL | INTRAVENOUS | Status: DC | PRN

## 2015-11-15 MED ORDER — HEPARIN SOD (PORK) LOCK FLUSH 100 UNIT/ML IV SOLN
500.0000 [IU] | INTRAVENOUS | Status: AC | PRN
  Administered 2015-11-15: 500 [IU]
  Filled 2015-11-15: qty 5

## 2015-11-15 NOTE — Discharge Summary (Signed)
Physician Discharge Summary  Brittany Foster [id number] DOB: Mar 25, 1985 DOA: 11/15/2015  PCP: Jeanann Lewandowsky, MD  Admit date: 11/15/2015  Discharge date: 11/15/2015  Time spent: 30 minutes  Discharge Diagnoses:  Active Problems:   Sickle cell anemia with pain [facility])  Discharge Condition: Stable  Diet recommendation: Regular  Filed Weights   11/15/15 0837  Weight: 125 lb (56.7 kg)   History of present illness:   Brittany Foster is a 30 y.o. female with history of sickle cell disease. She presented to the day hospital today with a major complaint of pain in her back, hip joints and legs consistent with her sickle cell pain crisis. She was at the day hospital whole day yesterday for the same complaint. She did not get sustained relief . She took oxycodone 10 mg and MS contin this morning yet without relief.Patient is not adherent with her home medications including hydroxyurea, folic acid and even pain medications. She said she is not motivated to do anything. She has sent her daughter to live with her grandparents in [place], she denies being depressed, vehemently declined psychiatry evaluation and behavioral therapy services. She rated her pain at 10 out of 10 today, she denies any fever, no urinary symptoms, no headache, no shortness of breath, no chest pain, no nausea, vomiting or diarrhea. No joint swelling or redness.  Hospital Course:  Brittany Foster was admitted to the day hospital with sickle cell painful crisis. Patient was treated with weight based IV Dilaudid PCA, IV Toradol as well as IV fluids. Brittany Foster showed significant improvement symptomatically, pain improved from 10 to 5/10 at the time of discharge. Patient was discharged home is a hemodynamically stable condition. Brittany Foster will follow-up at the clinic as previously scheduled, continue with home medications as per prior to admission. She was extensively counseled about her motivation and various other coping mechanisms were  discussed. She verbalized understanding. She was offered weekly visit to the day hospital regardless of her pain status, increased her Cymbalta to 60 mg daily.   Discharge Instructions We discussed the need for good hydration, monitoring of hydration status, avoidance of heat, cold, stress, and infection triggers. We discussed the need to be compliant with taking Hydrea. Brittany Foster was reminded of the need to seek medical attention of any symptoms of bleeding, anemia, or infection occurs.  Discharge Exam: Vitals:   11/15/15 1300 11/15/15 1400  BP: (!) 99/57 (!) 90/42  Pulse:  83  Resp: 17 11  Temp: 98 F (36.7 C) 98.9 F (37.2 C)    General appearance: alert, cooperative and no distress Eyes: conjunctivae/corneas clear. PERRL, EOM's intact. Fundi benign. Neck: no adenopathy, no carotid bruit, no JVD, supple, symmetrical, trachea midline and thyroid not enlarged, symmetric, no tenderness/mass/nodules Back: symmetric, no curvature. ROM normal. No CVA tenderness. Resp: clear to auscultation bilaterally Chest wall: no tenderness Cardio: regular rate and rhythm, S1, S2 normal, no murmur, click, rub or gallop GI: soft, non-tender; bowel sounds normal; no masses, no organomegaly Extremities: extremities normal, atraumatic, no cyanosis or edema Pulses: 2+ and symmetric Skin: Skin color, texture, turgor normal. No rashes or lesions Neurologic: Grossly normal  Discharge Instructions    Diet - low sodium heart healthy    Complete by:  As directed    Increase activity slowly    Complete by:  As directed      Current Discharge Medication List    CONTINUE these medications which have NOT CHANGED   Details  DULoxetine (CYMBALTA) 60 MG capsule Take 1 capsule (60  mg total) by mouth daily. Qty: 30 capsule, Refills: 3   Associated Diagnoses: Chronic pain syndrome    folic acid (FOLVITE) 1 MG tablet Take 1 tablet (1 mg total) by mouth daily. Qty: 90 tablet, Refills: 11   Associated Diagnoses:  Hb-SS disease without crisis (HCC)    hydroxyurea (HYDREA) 500 MG capsule TAKE 2 CAPSULES BY MOUTH DAILY. MAY TAKE WITH FOOD TO MINIMIZE GI SIDE EFFECTS. Qty: 60 capsule, Refills: 3   Associated Diagnoses: Hb-SS disease without crisis (HCC)    ibuprofen (ADVIL,MOTRIN) 200 MG tablet Take 400 mg by mouth every 6 (six) hours as needed for moderate pain.     morphine (MS CONTIN) 30 MG 12 hr tablet Take 1 tablet (30 mg total) by mouth every 12 (twelve) hours. Qty: 30 tablet, Refills: 0   Associated Diagnoses: Hb-SS disease without crisis (HCC)    oxyCODONE-acetaminophen (PERCOCET) 10-325 MG tablet Take 1 tablet by mouth every 4 (four) hours as needed for pain. Qty: 60 tablet, Refills: 0    Topiramate ER 100 MG CP24 Take 100 mg by mouth at bedtime.       Allergies  Allergen Reactions  . Ultram [Tramadol] Other (See Comments)    seizures  . Zofran [Ondansetron Hcl] Nausea And Vomiting  . Buprenorphine Hcl Hives and Rash    Shaking Tolerates Percocet, Norco, and buprenorphine  . Morphine And Related Hives, Rash and Other (See Comments)    Shaking Tolerates Percocet, Norco, Dilaudid, and buprenorphine  . Tape Rash     Significant Diagnostic Studies: Dg Chest 2 View  Result Date: 11/04/2015 CLINICAL DATA:  30 y/o female with sickle cell crisis onset today, c/o LEFT CP and leg pain. Hx heart murmur, MI, PE, former smoker. EXAM: CHEST  2 VIEW COMPARISON:  10/19/2015 FINDINGS: Right central line tip overlies the level of superior vena cava. Heart size is normal. Lungs are clear. Visualized osseous structures have a normal appearance. Surgical clips are noted in the right upper quadrant the abdomen. IMPRESSION: No active cardiopulmonary disease. Electronically Signed   By: Norva PavlovElizabeth  Brown M.D.   On: 11/04/2015 18:10   Dg Chest 2 View  Result Date: 10/19/2015 CLINICAL DATA:  Pt c/o central chest pain radiating to left side onset x approx. 4 hours ago. Also c/o nausea and fever. H/o sickle  cell and heart attack x 15 years ago. EXAM: CHEST  2 VIEW COMPARISON:  10/08/2015 FINDINGS: Cardiac silhouette is top-normal in size. No mediastinal or hilar masses or evidence of adenopathy. Lungs are clear.  No pleural effusion or pneumothorax. Right internal jugular central venous line catheter tip projects in the lower superior vena cava near the caval atrial junction. Bony thorax is unremarkable. IMPRESSION: No active cardiopulmonary disease. Electronically Signed   By: Amie Portlandavid  Ormond M.D.   On: 10/19/2015 13:56   Ct Angio Chest Pe W And/or Wo Contrast  Result Date: 10/19/2015 CLINICAL DATA:  Chest pain and shortness of breath. Fever. History sickle cell anemia. EXAM: CT ANGIOGRAPHY CHEST WITH CONTRAST TECHNIQUE: Multidetector CT imaging of the chest was performed using the standard protocol during bolus administration of intravenous contrast. Multiplanar CT image reconstructions and MIPs were obtained to evaluate the vascular anatomy. CONTRAST:  100 cc of Isovue 370 COMPARISON:  Chest radiograph of earlier today. Most recent CT 09/02/2015. FINDINGS: Cardiovascular: The quality of this exam for evaluation of pulmonary embolism is excellent. No evidence of pulmonary embolism. Normal aortic caliber without dissection. Heart size upper normal. Trace pericardial fluid is likely physiologic.  Catheter fragment again identified within the left brachiocephalic vein. A right-sided PICC line terminates at the low SVC. Mediastinum/Nodes: No mediastinal or hilar adenopathy. Minimal residual thymus in the anterior mediastinum. Lungs/Pleura: No pleural fluid.  Clear lungs. Upper Abdomen: Normal imaged portions of the liver, adrenal glands, kidneys. Hyperattenuation throughout the spleen is likely related to sickle cell anemia and prior infarcts. Musculoskeletal: Mildly increased density within the T7 and T9 vertebral bodies may relate to sickle cell anemia induced infarcts. This is similar. Review of the MIP images  confirms the above findings. IMPRESSION: 1.  No evidence of pulmonary embolism. 2.  No acute process in the chest. 3. Catheter fragment in the left brachiocephalic vein, unchanged including back to 2015. Electronically Signed   By: Jeronimo Greaves M.D.   On: 10/19/2015 17:02    Signed:  Jeanann Lewandowsky MD, MHA, FACP, FAAP, CPE   11/15/2015, 4:40 PM

## 2015-11-15 NOTE — Progress Notes (Signed)
[facility]. Pt stated her pain was 8/10. And her pain was in her back, arms and legs. Pt was treated with Dilaudid PCA, IV fluids and Toradol. Pt's pain was down to 7/10 at discharge. Pt was alert,oriented and ambulatory at discharge. Pt also received prescriptions for pain med. Verbalized understanding of discharge instructions.Discharged to home with her uncle.

## 2015-11-15 NOTE — Discharge Instructions (Signed)
Sickle Cell Anemia, Adult Sickle cell anemia is a condition in which red blood cells have an abnormal "sickle" shape. This abnormal shape shortens the cells' life span, which results in a lower than normal concentration of red blood cells in the blood. The sickle shape also causes the cells to clump together and block free blood flow through the blood vessels. As a result, the tissues and organs of the body do not receive enough oxygen. Sickle cell anemia causes organ damage and pain and increases the risk of infection. CAUSES  Sickle cell anemia is a genetic disorder. Those who receive two copies of the gene have the condition, and those who receive one copy have the trait. RISK FACTORS The sickle cell gene is most common in people whose families originated in [place]. Other areas of the globe where sickle cell trait occurs include the Mediterranean, [place] and [place], the [place], and the [place].  SIGNS AND SYMPTOMS  Pain, especially in the extremities, back, chest, or abdomen (common). The pain may start suddenly or may develop following an illness, especially if there is dehydration. Pain can also occur due to overexertion or exposure to extreme temperature changes.  Frequent severe bacterial infections, especially certain types of pneumonia and meningitis.  Pain and swelling in the hands and feet.  Decreased activity.   Loss of appetite.   Change in behavior.  Headaches.  Seizures.  Shortness of breath or difficulty breathing.  Vision changes.  Skin ulcers. Those with the trait may not have symptoms or they may have mild symptoms.  DIAGNOSIS  Sickle cell anemia is diagnosed with blood tests that demonstrate the genetic trait. It is often diagnosed during the newborn period, due to mandatory testing nationwide. A variety of blood tests, X-rays, CT scans, MRI scans, ultrasounds, and lung function tests may also be done to monitor the condition. TREATMENT  Sickle  cell anemia may be treated with:  Medicines. You may be given pain medicines, antibiotic medicines (to treat and prevent infections) or medicines to increase the production of certain types of hemoglobin.  Fluids.  Oxygen.  Blood transfusions. HOME CARE INSTRUCTIONS   Drink enough fluid to keep your urine clear or pale yellow. Increase your fluid intake in hot weather and during exercise.  Do not smoke. Smoking lowers oxygen levels in the blood.   Only take over-the-counter or prescription medicines for pain, fever, or discomfort as directed by your health care provider.  Take antibiotics as directed by your health care provider. Make sure you finish them it even if you start to feel better.   Take supplements as directed by your health care provider.   Consider wearing a medical alert bracelet. This tells anyone caring for you in an emergency of your condition.   When traveling, keep your medical information, health care provider's names, and the medicines you take with you at all times.   If you develop a fever, do not take medicines to reduce the fever right away. This could cover up a problem that is developing. Notify your health care provider.  Keep all follow-up appointments with your health care provider. Sickle cell anemia requires regular medical care. SEEK MEDICAL CARE IF: You have a fever. SEEK IMMEDIATE MEDICAL CARE IF:   You feel dizzy or faint.   You have new abdominal pain, especially on the left side near the stomach area.   You develop a persistent, often uncomfortable and painful penile erection (priapism). If this is not treated immediately it   will lead to impotence.   You have numbness your arms or legs or you have a hard time moving them.   You have a hard time with speech.   You have a fever or persistent symptoms for more than 2-3 days.   You have a fever and your symptoms suddenly get worse.   You have signs or symptoms of infection.  These include:   Chills.   Abnormal tiredness (lethargy).   Irritability.   Poor eating.   Vomiting.   You develop pain that is not helped with medicine.   You develop shortness of breath.  You have pain in your chest.   You are coughing up pus-like or bloody sputum.   You develop a stiff neck.  Your feet or hands swell or have pain.  Your abdomen appears bloated.  You develop joint pain. MAKE SURE YOU:  Understand these instructions.   This information is not intended to replace advice given to you by your health care provider. Make sure you discuss any questions you have with your health care provider.   Document Released: 05/27/2005 Document Revised: 03/09/2014 Document Reviewed: 09/28/2012 Elsevier Interactive Patient Education 2016 [employer]  

## 2015-11-15 NOTE — Telephone Encounter (Signed)
Patient called to request to come to the sickle cell clinic for treatment. Patient was here on yesterday for treatment, and was told that if she wasn't feeling better to call. Patient denies Fever, N/V/D, and chest pain. Patient states she has hip, leg, and back pain. Rates it 8/10 on pain scale. Patient last took oxycodone on Wednesday. Patient was placed on hold while we spoke with provider NP Bonita QuinLinda.  After speaking with Bonita QuinLinda NP patient was told she could come to the day hospital for treatment.

## 2015-11-15 NOTE — H&P (Signed)
[facility] History and Physical  Brittany Foster [id number] DOB: May 18, 1985 DOA: 11/15/2015  PCP: Jeanann Lewandowsky, MD   Chief Complaint: General body pain  HPI: Brittany Foster is a 30 y.o. female with history of sickle cell disease. She presented to the day hospital today with a major complaint of pain in her back, hip joints and legs consistent with her sickle cell pain crisis. She was at the day hospital whole day yesterday for the same complaint. She did not get sustained relief . She took oxycodone 10 mg and MS contin this morning yet without relief.Patient is not adherent with her home medications including hydroxyurea, folic acid and even pain medications. She said she is not motivated to do anything. She has sent her daughter to live with her grandparents in [place], she denies being depressed, vehemently declined psychiatry evaluation and behavioral therapy services. She rated her pain at 10 out of 10 today, she denies any fever, no urinary symptoms, no headache, no shortness of breath, no chest pain, no nausea, vomiting or diarrhea. No joint swelling or redness.  Systemic Review: General: The patient denies anorexia, fever, weight loss Cardiac: Denies chest pain, syncope, palpitations, pedal edema  Respiratory: Denies cough, shortness of breath, wheezing GI: Denies severe indigestion/heartburn, abdominal pain, nausea, vomiting, diarrhea and constipation GU: Denies hematuria, incontinence, dysuria  Musculoskeletal: Denies arthritis  Skin: Denies suspicious skin lesions Neurologic: Denies focal weakness or numbness, change in vision  Past Medical History:  Diagnosis Date  . Anemia   . Depression, major, recurrent (HCC)   . Migraines   . Sickle cell anemia (HCC)     Past Surgical History:  Procedure Laterality Date  . CESAREAN SECTION    . CHOLECYSTECTOMY  2000  . IR GENERIC HISTORICAL  10/08/2015   IR US GUIDE VASC ACCESS RIGHT 10/08/2015 Simonne Come, MD  [facility]  . IR GENERIC HISTORICAL  10/08/2015   IR FLUORO GUIDE CV LINE RIGHT 10/08/2015 Simonne Come, MD [facility]  . MULTIPLE TOOTH EXTRACTIONS N/A   . port a cath placement Right    about 6-7 years ago  . removal of porta cath Right 09/11/15  . TUBAL LIGATION      Allergies  Allergen Reactions  . Ultram [Tramadol] Other (See Comments)    seizures  . Zofran [Ondansetron Hcl] Nausea And Vomiting  . Buprenorphine Hcl Hives and Rash    Shaking Tolerates Percocet, Norco, and buprenorphine  . Morphine And Related Hives, Rash and Other (See Comments)    Shaking Tolerates Percocet, Norco, Dilaudid, and buprenorphine  . Tape Rash    Family History  Problem Relation Age of Onset  . Sickle cell trait Father   . Sickle cell trait Mother   . Sickle cell anemia Other       Prior to Admission medications   Medication Sig Start Date End Date Taking? Authorizing Provider  DULoxetine (CYMBALTA) 60 MG capsule Take 1 capsule (60 mg total) by mouth daily. 08/08/15  Yes [name], FNP  folic acid (FOLVITE) 1 MG tablet Take 1 tablet (1 mg total) by mouth daily. 08/08/15  Yes [name], FNP  hydroxyurea (HYDREA) 500 MG capsule TAKE 2 CAPSULES BY MOUTH DAILY. MAY TAKE WITH FOOD TO MINIMIZE GI SIDE EFFECTS. Patient taking differently: Take 500 mg by mouth daily. MAY TAKE WITH FOOD TO MINIMIZE GI SIDE EFFECTS. 08/08/15  Yes [name], FNP  ibuprofen (ADVIL,MOTRIN) 200 MG tablet Take 400 mg by mouth every 6 (six)  hours as needed for moderate pain.    Yes Historical Provider, MD  morphine (MS CONTIN) 30 MG 12 hr tablet Take 1 tablet (30 mg total) by mouth every 12 (twelve) hours. 10/25/15  Yes Henrietta Hoover, NP  oxyCODONE-acetaminophen (PERCOCET) 10-325 MG tablet Take 1 tablet by mouth every 4 (four) hours as needed for pain. 11/01/15  Yes Quentin Angst, MD  Topiramate ER 100 MG CP24 Take 100 mg by mouth at bedtime.   Yes Historical Provider, MD     Physical Exam: Vitals:    11/15/15 0837  BP: 110/71  Pulse: (!) 101  Resp: 20  Temp: 99.4 F (37.4 C)  TempSrc: Oral  SpO2: 97%  Weight: 125 lb (56.7 kg)  Height: 5' (1.524 m)    General: Alert, awake, afebrile, anicteric, not in obvious distress HEENT: Normocephalic and Atraumatic, Mucous membranes pink                PERRLA; EOM intact; No scleral icterus,                 Nares: Patent, Oropharynx: Clear, Fair Dentition                 Neck: FROM, no cervical lymphadenopathy, thyromegaly, carotid bruit or JVD;  CHEST WALL: No tenderness  CHEST: Normal respiration, clear to auscultation bilaterally  HEART: Regular rate and rhythm; no murmurs rubs or gallops  BACK: No kyphosis or scoliosis; no CVA tenderness  ABDOMEN: Positive Bowel Sounds, soft, non-tender; no masses, no organomegaly EXTREMITIES: No cyanosis, clubbing, or edema SKIN:  no rash or ulceration  CNS: Alert and Oriented x 4, Nonfocal exam, CN 2-12 intact  Labs on Admission:  Basic Metabolic Panel:  Recent Labs Lab 11/08/15 1450 11/11/15 0404  NA 141 137  K 3.9 3.9  CL 113* 109  CO2 23 21*  GLUCOSE 123* 128*  BUN 8 8  CREATININE 0.54 0.41*  CALCIUM 8.8* 8.9   Liver Function Tests:  Recent Labs Lab 11/11/15 0404  AST 66*  ALT 40  ALKPHOS 88  BILITOT 2.1*  PROT 7.7  ALBUMIN 4.3   No results for input(s): LIPASE, AMYLASE in the last 168 hours. No results for input(s): AMMONIA in the last 168 hours. CBC:  Recent Labs Lab 11/08/15 1450 11/11/15 0404  WBC 10.9* 10.8*  NEUTROABS 4.9 5.1  HGB 7.9* 8.3*  HCT 23.0* 24.0*  MCV 99.1 98.4  PLT 299 276   Cardiac Enzymes: No results for input(s): CKTOTAL, CKMB, CKMBINDEX, TROPONINI in the last 168 hours.  BNP (last 3 results) No results for input(s): BNP in the last 8760 hours.  ProBNP (last 3 results) No results for input(s): PROBNP in the last 8760 hours.  CBG: No results for input(s): GLUCAP in the last 168 hours.   Assessment/Plan Active Problems:   Sickle  cell anemia with pain (HCC)   Admits to the Day Hospital  IVF D5 .45% Saline @ 125 mls/hour  Weight based Dilaudid PCA started within 30 minutes of admission  IV Toradol 30 mg Q 6 H  Monitor vitals very closely, Re-evaluate pain scale every hour  2 L of Oxygen by [place]  Patient will be re-evaluated for pain in the context of function and relationship to baseline as care progresses.  If no significant relieve from pain (remains above 5/10) will transfer patient to inpatient services for further evaluation and management  Code Status: Full  Family Communication: None  DVT Prophylaxis: Ambulate as tolerated  Time spent: 35 Minutes  Gatsby Chismar, MD, MHA, FACP, FAAP, CPE  If 7PM-7AM, please contact night-coverage [email or web address] 11/15/2015, 9:13 AM

## 2015-11-20 ENCOUNTER — Encounter (HOSPITAL_COMMUNITY): Payer: Self-pay | Admitting: *Deleted

## 2015-11-20 ENCOUNTER — Non-Acute Institutional Stay (HOSPITAL_COMMUNITY)
Admission: AD | Admit: 2015-11-20 | Discharge: 2015-11-20 | Disposition: A | Discharge status: Home / Self Care | Payer: Medicaid Other | Source: Ambulatory Visit | Attending: Internal Medicine | Admitting: Internal Medicine

## 2015-11-20 ENCOUNTER — Telehealth (HOSPITAL_COMMUNITY): Payer: Self-pay | Admitting: Hematology

## 2015-11-20 DIAGNOSIS — Z79891 Long term (current) use of opiate analgesic: Secondary | ICD-10-CM | POA: Diagnosis not present

## 2015-11-20 DIAGNOSIS — Z79899 Other long term (current) drug therapy: Secondary | ICD-10-CM | POA: Insufficient documentation

## 2015-11-20 DIAGNOSIS — D5700 Hb-SS disease with crisis, unspecified: Secondary | ICD-10-CM | POA: Diagnosis present

## 2015-11-20 DIAGNOSIS — G894 Chronic pain syndrome: Secondary | ICD-10-CM | POA: Diagnosis not present

## 2015-11-20 DIAGNOSIS — F329 Major depressive disorder, single episode, unspecified: Secondary | ICD-10-CM | POA: Insufficient documentation

## 2015-11-20 LAB — URINALYSIS, ROUTINE W REFLEX MICROSCOPIC
BILIRUBIN URINE: NEGATIVE
Glucose, UA: NEGATIVE mg/dL
HGB URINE DIPSTICK: NEGATIVE
Ketones, ur: NEGATIVE mg/dL
Leukocytes, UA: NEGATIVE
NITRITE: NEGATIVE
PROTEIN: NEGATIVE mg/dL
SPECIFIC GRAVITY, URINE: 1.013 (ref 1.005–1.030)
pH: 6.5 (ref 5.0–8.0)

## 2015-11-20 LAB — RETICULOCYTES
RBC.: 2.25 MIL/uL — ABNORMAL LOW (ref 3.87–5.11)
RETIC COUNT ABSOLUTE: 461.3 10*3/uL — AB (ref 19.0–186.0)
RETIC CT PCT: 20.5 % — AB (ref 0.4–3.1)

## 2015-11-20 LAB — CBC WITH DIFFERENTIAL/PLATELET
BASOS ABS: 0 10*3/uL (ref 0.0–0.1)
Basophils Relative: 0 %
Eosinophils Absolute: 0.2 10*3/uL (ref 0.0–0.7)
Eosinophils Relative: 1 %
HCT: 22.5 % — ABNORMAL LOW (ref 36.0–46.0)
HEMOGLOBIN: 7.9 g/dL — AB (ref 12.0–15.0)
LYMPHS ABS: 4.1 10*3/uL — AB (ref 0.7–4.0)
Lymphocytes Relative: 30 %
MCH: 35.1 pg — AB (ref 26.0–34.0)
MCHC: 35.1 g/dL (ref 30.0–36.0)
MCV: 100 fL (ref 78.0–100.0)
Monocytes Absolute: 1.5 10*3/uL — ABNORMAL HIGH (ref 0.1–1.0)
Monocytes Relative: 11 %
NEUTROS ABS: 8 10*3/uL — AB (ref 1.7–7.7)
Neutrophils Relative %: 58 %
PLATELETS: 349 10*3/uL (ref 150–400)
RBC: 2.25 MIL/uL — AB (ref 3.87–5.11)
RDW: 18.2 % — ABNORMAL HIGH (ref 11.5–15.5)
WBC: 13.7 10*3/uL — ABNORMAL HIGH (ref 4.0–10.5)

## 2015-11-20 MED ORDER — HEPARIN SOD (PORK) LOCK FLUSH 100 UNIT/ML IV SOLN
250.0000 [IU] | INTRAVENOUS | Status: AC | PRN
  Administered 2015-11-20: 250 [IU]
  Filled 2015-11-20: qty 5

## 2015-11-20 MED ORDER — NALOXONE HCL 0.4 MG/ML IJ SOLN: 0.4000 mg | INTRAMUSCULAR | Status: DC | PRN

## 2015-11-20 MED ORDER — SODIUM CHLORIDE 0.9% FLUSH: 10.0000 mL | INTRAVENOUS | Status: DC | PRN

## 2015-11-20 MED ORDER — ONDANSETRON HCL 4 MG/2ML IJ SOLN: 4.0000 mg | Freq: Four times a day (QID) | INTRAMUSCULAR | Status: DC | PRN

## 2015-11-20 MED ORDER — DEXTROSE-NACL 5-0.45 % IV SOLN
INTRAVENOUS | Status: DC
  Administered 2015-11-20: 11:00:00 via INTRAVENOUS

## 2015-11-20 MED ORDER — SENNOSIDES-DOCUSATE SODIUM 8.6-50 MG PO TABS
1.0000 | ORAL_TABLET | Freq: Two times a day (BID) | ORAL | Status: DC
  Filled 2015-11-20: qty 1

## 2015-11-20 MED ORDER — HYDROMORPHONE 1 MG/ML IV SOLN
INTRAVENOUS | Status: DC
  Administered 2015-11-20: 3 mg via INTRAVENOUS
  Administered 2015-11-20: 13.5 mg via INTRAVENOUS
  Administered 2015-11-20: 11:00:00 via INTRAVENOUS
  Filled 2015-11-20: qty 25

## 2015-11-20 MED ORDER — SODIUM CHLORIDE 0.9 % IV SOLN
25.0000 mg | INTRAVENOUS | Status: DC | PRN
  Filled 2015-11-20: qty 0.5

## 2015-11-20 MED ORDER — KETOROLAC TROMETHAMINE 30 MG/ML IJ SOLN
30.0000 mg | Freq: Four times a day (QID) | INTRAMUSCULAR | Status: DC
  Administered 2015-11-20: 30 mg via INTRAVENOUS
  Filled 2015-11-20: qty 1

## 2015-11-20 MED ORDER — SODIUM CHLORIDE 0.9% FLUSH
10.0000 mL | INTRAVENOUS | Status: AC | PRN
  Administered 2015-11-20: 10 mL

## 2015-11-20 MED ORDER — SODIUM CHLORIDE 0.9% FLUSH: 9.0000 mL | INTRAVENOUS | Status: DC | PRN

## 2015-11-20 MED ORDER — DIPHENHYDRAMINE HCL 25 MG PO CAPS
25.0000 mg | ORAL_CAPSULE | ORAL | Status: DC | PRN
  Administered 2015-11-20: 25 mg via ORAL
  Filled 2015-11-20: qty 2

## 2015-11-20 MED ORDER — POLYETHYLENE GLYCOL 3350 17 G PO PACK: 17.0000 g | PACK | Freq: Every day | ORAL | Status: DC | PRN

## 2015-11-20 NOTE — H&P (Signed)
[facility] History and Physical  Brittany Foster WUJ:[id number] DOB: [birth year] DOA: 11/20/2015  PCP: Jeanann Lewandowsky, MD   Chief Complaint: Sickle Cell pain   HPI: Brittany Foster is a 30 y.o. female with history of sickle cell disease who presented to the day hospital today with complaint of pain in her back, hip joints and legs consistent with her sickle cell pain crisis. She was at the day 5 days ago for the same complaint. She took oxycodone 10 mg and MS contin this morning without relief.Patient is not adherent with her home medications including hydroxyurea, folic acid and even pain medications. She said she is not motivated to do anything. She rated her pain at 10 out of 10 today, she denies any fever, no urinary symptoms, no headache, no shortness of breath, no chest pain, no nausea, vomiting or diarrhea. No joint swelling or redness.  Systemic Review: General: The patient denies anorexia, fever, weight loss Cardiac: Denies chest pain, syncope, palpitations, pedal edema  Respiratory: Denies cough, shortness of breath, wheezing GI: Denies severe indigestion/heartburn, abdominal pain, nausea, vomiting, diarrhea and constipation GU: Denies hematuria, incontinence, dysuria  Musculoskeletal: Denies arthritis  Skin: Denies suspicious skin lesions Neurologic: Denies focal weakness or numbness, change in vision  Past Medical History:  Diagnosis Date  . Anemia   . Depression, major, recurrent (HCC)   . Migraines   . Sickle cell anemia (HCC)     Past Surgical History:  Procedure Laterality Date  . CESAREAN SECTION    . CHOLECYSTECTOMY  2000  . IR GENERIC HISTORICAL  10/08/2015   IR US GUIDE VASC ACCESS RIGHT 10/08/2015 Simonne Come, MD [facility]  . IR GENERIC HISTORICAL  10/08/2015   IR FLUORO GUIDE CV LINE RIGHT 10/08/2015 Simonne Come, MD [facility]  . MULTIPLE TOOTH EXTRACTIONS N/A   . port a cath placement Right    about 6-7 years ago  . removal of porta cath Right  09/11/15  . TUBAL LIGATION      Allergies  Allergen Reactions  . Ultram [Tramadol] Other (See Comments)    seizures  . Zofran [Ondansetron Hcl] Nausea And Vomiting  . Buprenorphine Hcl Hives and Rash    Shaking Tolerates Percocet, Norco, and buprenorphine  . Morphine And Related Hives, Rash and Other (See Comments)    Shaking Tolerates Percocet, Norco, Dilaudid, and buprenorphine  . Tape Rash    Family History  Problem Relation Age of Onset  . Sickle cell trait Father   . Sickle cell trait Mother   . Sickle cell anemia Other       Prior to Admission medications   Medication Sig Start Date End Date Taking? Authorizing Provider  DULoxetine (CYMBALTA) 60 MG capsule Take 1 capsule (60 mg total) by mouth daily. 08/08/15  Yes [name], FNP  folic acid (FOLVITE) 1 MG tablet Take 1 tablet (1 mg total) by mouth daily. 08/08/15  Yes [name], FNP  hydroxyurea (HYDREA) 500 MG capsule TAKE 2 CAPSULES BY MOUTH DAILY. MAY TAKE WITH FOOD TO MINIMIZE GI SIDE EFFECTS. Patient taking differently: Take 500 mg by mouth daily. MAY TAKE WITH FOOD TO MINIMIZE GI SIDE EFFECTS. 08/08/15  Yes [name], FNP  ibuprofen (ADVIL,MOTRIN) 200 MG tablet Take 400 mg by mouth every 6 (six) hours as needed for moderate pain.    Yes Historical Provider, MD  morphine (MS CONTIN) 30 MG 12 hr tablet Take 1 tablet (30 mg total) by mouth every 12 (twelve) hours.  10/25/15  Yes Henrietta HooverLinda C Bernhardt, NP  oxyCODONE-acetaminophen (PERCOCET) 10-325 MG tablet Take 1 tablet by mouth every 4 (four) hours as needed for pain. 11/01/15  Yes Quentin Angstlugbemiga E Trevonn Hallum, MD  Topiramate ER 100 MG CP24 Take 100 mg by mouth at bedtime.   Yes Historical Provider, MD     Physical Exam: Vitals:   11/20/15 0930  BP: (!) 104/57  Pulse: 82  Resp: 16  Temp: 99.2 F (37.3 C)  TempSrc: Oral  Weight: 125 lb (56.7 kg)  Height: 5' (1.524 m)    General: Alert, awake, afebrile, anicteric, not in obvious distress HEENT: Normocephalic  and Atraumatic, Mucous membranes pink                PERRLA; EOM intact; No scleral icterus,                 Nares: Patent, Oropharynx: Clear, Fair Dentition                 Neck: FROM, no cervical lymphadenopathy, thyromegaly, carotid bruit or JVD;  CHEST WALL: No tenderness  CHEST: Normal respiration, clear to auscultation bilaterally  HEART: Regular rate and rhythm; no murmurs rubs or gallops  BACK: No kyphosis or scoliosis; no CVA tenderness  ABDOMEN: Positive Bowel Sounds, soft, non-tender; no masses, no organomegaly EXTREMITIES: No cyanosis, clubbing, or edema SKIN:  no rash or ulceration  CNS: Alert and Oriented x 4, Nonfocal exam, CN 2-12 intact  Labs on Admission:  Basic Metabolic Panel: No results for input(s): NA, K, CL, CO2, GLUCOSE, BUN, CREATININE, CALCIUM, MG, PHOS in the last 168 hours. Liver Function Tests: No results for input(s): AST, ALT, ALKPHOS, BILITOT, PROT, ALBUMIN in the last 168 hours. No results for input(s): LIPASE, AMYLASE in the last 168 hours. No results for input(s): AMMONIA in the last 168 hours. CBC:  Recent Labs Lab 11/15/15 0930  WBC 13.7*  NEUTROABS 9.1*  HGB 8.0*  HCT 22.8*  MCV 98.7  PLT 443*   Cardiac Enzymes: No results for input(s): CKTOTAL, CKMB, CKMBINDEX, TROPONINI in the last 168 hours.  BNP (last 3 results) No results for input(s): BNP in the last 8760 hours.  ProBNP (last 3 results) No results for input(s): PROBNP in the last 8760 hours.  CBG: No results for input(s): GLUCAP in the last 168 hours.   Assessment/Plan Active Problems:   Sickle cell anemia with pain (HCC)   Admits to the Day Hospital  IVF D5 .45% Saline @ 125 mls/hour  Weight based Dilaudid PCA started within 30 minutes of admission  IV Toradol 30 mg Q 6 H  Monitor vitals very closely, Re-evaluate pain scale every hour  2 L of Oxygen by [place]  Patient will be re-evaluated for pain in the context of function and relationship to baseline as care  progresses.  If no significant relieve from pain (remains above 5/10) will transfer patient to inpatient services for further evaluation and management  Code Status: Full  Family Communication: None  DVT Prophylaxis: Ambulate as tolerated   Time spent: 35 Minutes  Alvino Lechuga, MD, MHA, FACP, FAAP, CPE  If 7PM-7AM, please contact night-coverage [email or web address] 11/20/2015, 10:05 AM

## 2015-11-20 NOTE — Progress Notes (Signed)
[facility]. Pt stated her pain was 8/10 on admission and down to 7/10 at discharge. Pt was treated with IV fluids, IV Toradol and Dilaudid PCA. Pt received discharge instructions with verbal understanding. Pt was alert, oriented and ambulatory at discharge.

## 2015-11-20 NOTE — Telephone Encounter (Signed)
Called patient back and advised I spoke with. Dr. Hyman HopesJegede and it is ok for her to come to [facility].  Patient verbalizes understanding.

## 2015-11-20 NOTE — Telephone Encounter (Signed)
Patient C/O pain to legs/back/hips. Patient rates pain 8/10 on pain scale.  Patient denies chest pain or shortness of breath, patient denies N/V/D, or abdominal pain. Patient states pain has not improved after taking pain medications as prescribed.  I advised I would notify the provider and give her a call back.  Patient verbalizes understanding.

## 2015-11-20 NOTE — Progress Notes (Signed)
Stating "I can't understand why I can't have the IV Benadryl. The by mouth never works. So I only want to take one by mouth."

## 2015-11-20 NOTE — Discharge Instructions (Signed)

## 2015-11-20 NOTE — Discharge Summary (Signed)
Physician Discharge Summary  Brittany Foster BJY:[id number] DOB: 10/04/1985 DOA: 11/20/2015  PCP: Jeanann LewandowskyJEGEDE, Suheyla Mortellaro, MD  Admit date: 11/20/2015  Discharge date: 11/20/2015  Time spent: 30 minutes  Discharge Diagnoses:  Active Problems:   Sickle cell anemia with pain [facility](HCC)   Discharge Condition: Stable  Diet recommendation: Regular  Filed Weights   11/20/15 0930  Weight: 125 lb (56.7 kg)   History of present illness:  Brittany Foster is a 30 y.o. female with history of sickle cell disease who presented to the day hospital today with complaint of pain in her back, hip joints and legs consistent with her sickle cell pain crisis. She was at the day 5 days ago for the same complaint. She took oxycodone 10 mg and MS contin this morning without relief.Patient is not adherent with her home medications including hydroxyurea, folic acid and even pain medications. She said she is not motivated to do anything. She rated her pain at 10 out of 10 today, she denies any fever, no urinary symptoms, no headache, no shortness of breath, no chest pain, no nausea, vomiting or diarrhea. No joint swelling or redness.  Hospital Course:  Brittany Foster was admitted to the day hospital with sickle cell painful crisis. Patient was treated with weight based IV Dilaudid PCA, IV Toradol as well as IV fluids. Brittany Foster showed significant improvement symptomatically, pain improved from 10 to 5/10 at the time of discharge. Patient was discharged home is a hemodynamically stable condition. Brittany Foster will follow-up at the clinic as previously scheduled, continue with home medications as per prior to admission.  Discharge Instructions We discussed the need for good hydration, monitoring of hydration status, avoidance of heat, cold, stress, and infection triggers. We discussed the need to be compliant with taking Hydrea. Brittany Foster was reminded of the need to seek medical attention of any symptoms of bleeding, anemia, or infection  occurs.  Discharge Exam: Vitals:   11/20/15 1607 11/20/15 1609  BP:    Pulse: 76   Resp: 13 13  Temp:      General appearance: alert, cooperative and no distress Eyes: conjunctivae/corneas clear. PERRL, EOM's intact. Fundi benign. Neck: no adenopathy, no carotid bruit, no JVD, supple, symmetrical, trachea midline and thyroid not enlarged, symmetric, no tenderness/mass/nodules Back: symmetric, no curvature. ROM normal. No CVA tenderness. Resp: clear to auscultation bilaterally Chest wall: no tenderness Cardio: regular rate and rhythm, S1, S2 normal, no murmur, click, rub or gallop GI: soft, non-tender; bowel sounds normal; no masses, no organomegaly Extremities: extremities normal, atraumatic, no cyanosis or edema Pulses: 2+ and symmetric Skin: Skin color, texture, turgor normal. No rashes or lesions Neurologic: Grossly normal   Current Discharge Medication List    CONTINUE these medications which have NOT CHANGED   Details  DULoxetine (CYMBALTA) 60 MG capsule Take 1 capsule (60 mg total) by mouth daily. Qty: 30 capsule, Refills: 3   Associated Diagnoses: Chronic pain syndrome    folic acid (FOLVITE) 1 MG tablet Take 1 tablet (1 mg total) by mouth daily. 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 MAY TAKE WITH FOOD TO MINIMIZE GI SIDE EFFECTS. Qty: 60 capsule, Refills: 3   Associated Diagnoses: Hb-SS disease without crisis (HCC)    ibuprofen (ADVIL,MOTRIN) 200 MG tablet Take 400 mg by mouth every 6 (six) hours as needed for moderate pain.     morphine (MS CONTIN) 30 MG 12 hr  tablet Take 1 tablet (30 mg total) by mouth every 12 (twelve) hours. Qty: 30 tablet, Refills: 0   Associated Diagnoses: Hb-SS disease without crisis (HCC)    oxyCODONE-acetaminophen (PERCOCET) 10-325 MG tablet Take 1 tablet by mouth every 4 (four) hours as needed for pain. Qty: 60  tablet, Refills: 0    Topiramate ER 100 MG CP24 Take 100 mg by mouth at bedtime.       Allergies  Allergen Reactions  . Ultram [Tramadol] Other (See Comments)    seizures  . Zofran [Ondansetron Hcl] Nausea And Vomiting  . Buprenorphine Hcl Hives and Rash    Shaking Tolerates Percocet, Norco, and buprenorphine  . Morphine And Related Hives, Rash and Other (See Comments)    Shaking Tolerates Percocet, Norco, Dilaudid, and buprenorphine  . Tape Rash     Significant Diagnostic Studies: Dg Chest 2 View  Result Date: 11/04/2015 CLINICAL DATA:  30 y/o female with sickle cell crisis onset today, c/o LEFT CP and leg pain. Hx heart murmur, MI, PE, former smoker. EXAM: CHEST  2 VIEW COMPARISON:  10/19/2015 FINDINGS: Right central line tip overlies the level of superior vena cava. Heart size is normal. Lungs are clear. Visualized osseous structures have a normal appearance. Surgical clips are noted in the right upper quadrant the abdomen. IMPRESSION: No active cardiopulmonary disease. Electronically Signed   By: Norva Pavlov M.D.   On: 11/04/2015 18:10    Signed:  Jeanann Lewandowsky MD, MHA, FACP, FAAP, CPE   11/20/2015, 6:15 PM

## 2015-11-21 ENCOUNTER — Telehealth (HOSPITAL_COMMUNITY): Payer: Self-pay | Admitting: *Deleted

## 2015-11-21 ENCOUNTER — Encounter (HOSPITAL_COMMUNITY): Payer: Self-pay | Admitting: *Deleted

## 2015-11-21 ENCOUNTER — Emergency Department (HOSPITAL_COMMUNITY): Payer: Medicaid Other

## 2015-11-21 ENCOUNTER — Inpatient Hospital Stay (HOSPITAL_COMMUNITY)
Admission: EM | Admit: 2015-11-21 | Discharge: 2015-11-23 | DRG: 812 | Disposition: A | Discharge status: Home / Self Care | Payer: Medicaid Other | Attending: Internal Medicine | Admitting: Internal Medicine

## 2015-11-21 DIAGNOSIS — F339 Major depressive disorder, recurrent, unspecified: Secondary | ICD-10-CM | POA: Diagnosis present

## 2015-11-21 DIAGNOSIS — G894 Chronic pain syndrome: Secondary | ICD-10-CM | POA: Diagnosis present

## 2015-11-21 DIAGNOSIS — F1120 Opioid dependence, uncomplicated: Secondary | ICD-10-CM | POA: Diagnosis present

## 2015-11-21 DIAGNOSIS — Z79899 Other long term (current) drug therapy: Secondary | ICD-10-CM

## 2015-11-21 DIAGNOSIS — D5700 Hb-SS disease with crisis, unspecified: Principal | ICD-10-CM | POA: Diagnosis present

## 2015-11-21 DIAGNOSIS — E876 Hypokalemia: Secondary | ICD-10-CM | POA: Diagnosis present

## 2015-11-21 DIAGNOSIS — F32A Depression, unspecified: Secondary | ICD-10-CM | POA: Diagnosis present

## 2015-11-21 DIAGNOSIS — F17200 Nicotine dependence, unspecified, uncomplicated: Secondary | ICD-10-CM | POA: Diagnosis present

## 2015-11-21 DIAGNOSIS — D72829 Elevated white blood cell count, unspecified: Secondary | ICD-10-CM | POA: Diagnosis present

## 2015-11-21 DIAGNOSIS — Z832 Family history of diseases of the blood and blood-forming organs and certain disorders involving the immune mechanism: Secondary | ICD-10-CM

## 2015-11-21 DIAGNOSIS — F329 Major depressive disorder, single episode, unspecified: Secondary | ICD-10-CM | POA: Diagnosis present

## 2015-11-21 DIAGNOSIS — Z9049 Acquired absence of other specified parts of digestive tract: Secondary | ICD-10-CM

## 2015-11-21 LAB — COMPREHENSIVE METABOLIC PANEL
ALT: 30 U/L (ref 14–54)
AST: 47 U/L — AB (ref 15–41)
Albumin: 4.2 g/dL (ref 3.5–5.0)
Alkaline Phosphatase: 77 U/L (ref 38–126)
Anion gap: 8 (ref 5–15)
BILIRUBIN TOTAL: 2.5 mg/dL — AB (ref 0.3–1.2)
CALCIUM: 8.8 mg/dL — AB (ref 8.9–10.3)
CHLORIDE: 110 mmol/L (ref 101–111)
CO2: 22 mmol/L (ref 22–32)
CREATININE: 0.45 mg/dL (ref 0.44–1.00)
Glucose, Bld: 120 mg/dL — ABNORMAL HIGH (ref 65–99)
Potassium: 2.8 mmol/L — ABNORMAL LOW (ref 3.5–5.1)
Sodium: 140 mmol/L (ref 135–145)
TOTAL PROTEIN: 7.6 g/dL (ref 6.5–8.1)

## 2015-11-21 LAB — CBC WITH DIFFERENTIAL/PLATELET
BASOS ABS: 0 10*3/uL (ref 0.0–0.1)
BASOS PCT: 0 %
EOS ABS: 0.2 10*3/uL (ref 0.0–0.7)
EOS PCT: 1 %
HCT: 23.3 % — ABNORMAL LOW (ref 36.0–46.0)
HEMOGLOBIN: 8.3 g/dL — AB (ref 12.0–15.0)
LYMPHS ABS: 4.4 10*3/uL — AB (ref 0.7–4.0)
Lymphocytes Relative: 29 %
MCH: 35.2 pg — AB (ref 26.0–34.0)
MCHC: 35.6 g/dL (ref 30.0–36.0)
MCV: 98.7 fL (ref 78.0–100.0)
Monocytes Absolute: 1.2 10*3/uL — ABNORMAL HIGH (ref 0.1–1.0)
Monocytes Relative: 8 %
NEUTROS PCT: 62 %
Neutro Abs: 9.2 10*3/uL — ABNORMAL HIGH (ref 1.7–7.7)
PLATELETS: 357 10*3/uL (ref 150–400)
RBC: 2.36 MIL/uL — AB (ref 3.87–5.11)
RDW: 20.1 % — ABNORMAL HIGH (ref 11.5–15.5)
WBC: 15 10*3/uL — AB (ref 4.0–10.5)

## 2015-11-21 LAB — RAPID URINE DRUG SCREEN, HOSP PERFORMED
Amphetamines: NOT DETECTED
Barbiturates: NOT DETECTED
Benzodiazepines: NOT DETECTED
COCAINE: NOT DETECTED
OPIATES: POSITIVE — AB
TETRAHYDROCANNABINOL: NOT DETECTED

## 2015-11-21 LAB — RETICULOCYTES: RBC.: 2.37 MIL/uL — AB (ref 3.87–5.11)

## 2015-11-21 MED ORDER — HYDROMORPHONE HCL 2 MG/ML IJ SOLN: 0.0250 mg/kg | INTRAMUSCULAR | Status: AC

## 2015-11-21 MED ORDER — HYDROMORPHONE HCL 2 MG/ML IJ SOLN
1.4000 mg | Freq: Once | INTRAMUSCULAR | Status: AC
  Administered 2015-11-21: 1.4 mg via INTRAVENOUS

## 2015-11-21 MED ORDER — SODIUM CHLORIDE 0.45 % IV SOLN
INTRAVENOUS | Status: DC
  Administered 2015-11-21: 23:00:00 via INTRAVENOUS

## 2015-11-21 MED ORDER — HYDROMORPHONE HCL 2 MG/ML IJ SOLN
0.0250 mg/kg | INTRAMUSCULAR | Status: AC
  Administered 2015-11-21: 1.4 mg via INTRAVENOUS
  Filled 2015-11-21: qty 1

## 2015-11-21 MED ORDER — HYDROMORPHONE HCL 2 MG/ML IJ SOLN
1.4000 mg | Freq: Once | INTRAMUSCULAR | Status: DC
  Filled 2015-11-21: qty 1

## 2015-11-21 MED ORDER — HYDROMORPHONE HCL 2 MG/ML IJ SOLN
2.0000 mg | Freq: Once | INTRAMUSCULAR | Status: AC
Start: 2015-11-21 — End: 2015-11-21
  Administered 2015-11-21: 2 mg via INTRAVENOUS
  Filled 2015-11-21: qty 1

## 2015-11-21 NOTE — ED Provider Notes (Signed)
[facility]-EMERGENCY DEPT Provider Note   CSN: [id number] Arrival date & time: 11/21/15  1920  By signing my name below, I, Alyssa Grove, attest that this documentation has been prepared under the direction and in the presence of Earley Favor, NP. Electronically Signed: Alyssa Grove, ED Scribe. 11/21/15. 9:53 PM.  History   Chief Complaint Chief Complaint  Patient presents with  . Sickle Cell Pain Crisis   The history is provided by the patient. No language interpreter was used.   HPI Comments: Brittany Foster is a 30 y.o. female with PMHx of Sickle Cell Anemia who presents to the Emergency Department complaining of gradual onset and worsening left rib, hips pain beginning 2 days PTA. Pt states she took 10 mg of Oxycodone at 5 PM with no relief to pain. Pt states she has experienced chest syndrome in the past and she feels as if she is currently experiencing it.  Reports associated shortness of breath and fever. She was seen in the hospital yesterday where she was rehydrated and sent home, but she states there was no relief to pain. Pt currently receiving Depo shots.  Past Medical History:  Diagnosis Date  . Anemia   . Depression, major, recurrent (HCC)   . Migraines   . Sickle cell anemia [facility])     Patient Active Problem List   Diagnosis Date Noted  . Vasoocclusive sickle cell crisis (HCC) 08/21/2015  . Depression, major, recurrent (HCC)   . Family planning, Depo-Provera contraception monitoring/administration 08/08/2015  . Sickle cell anemia with crisis (HCC) 07/12/2015  . Sickle cell anemia with pain (HCC) 05/28/2015  . Intractable pain   . Chest pain 03/07/2015  . Leukocytosis 03/07/2015  . Vitamin D deficiency 01/08/2015  . Chronic pain syndrome 01/08/2015  . Sickle cell crisis (HCC) 10/22/2014  . Hypokalemia 08/21/2014  . Depression 08/09/2014  . 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 Sickle cell disease with crisis (HCC) 07/12/2014  . Hb-SS disease with crisis  (HCC) 06/11/2014  . Pericardial effusion   . Anemia 02/09/2014  . Neuropathy (HCC) 02/09/2014  . Chronic headache disorder 02/09/2014  . Abnormal CT scan, chest   . Sickle cell pain crisis (HCC) 12/30/2013    Past Surgical History:  Procedure Laterality Date  . CESAREAN SECTION    . CHOLECYSTECTOMY  2000  . IR GENERIC HISTORICAL  10/08/2015   IR US GUIDE VASC ACCESS RIGHT 10/08/2015 Simonne Come, MD [facility]  . IR GENERIC HISTORICAL  10/08/2015   IR FLUORO GUIDE CV LINE RIGHT 10/08/2015 Simonne Come, MD [facility]  . MULTIPLE TOOTH EXTRACTIONS N/A   . port a cath placement Right    about 6-7 years ago  . removal of porta cath Right 09/11/15  . TUBAL LIGATION      OB History    No data available       Home Medications    Prior to Admission medications   Medication Sig Start Date End Date Taking? Authorizing Provider  DULoxetine (CYMBALTA) 60 MG capsule Take 1 capsule (60 mg total) by mouth daily. 08/08/15  Yes [name], FNP  folic acid (FOLVITE) 1 MG tablet Take 1 tablet (1 mg total) by mouth daily. 08/08/15  Yes [name], FNP  hydroxyurea (HYDREA) 500 MG capsule TAKE 2 CAPSULES BY MOUTH DAILY. MAY TAKE WITH FOOD TO MINIMIZE GI SIDE EFFECTS. Patient taking differently: Take 500 mg by mouth daily. MAY TAKE WITH FOOD TO MINIMIZE GI SIDE EFFECTS. 08/08/15  Yes Rosalene Billings  Hart RochesterHollis, FNP  ibuprofen (ADVIL,MOTRIN) 200 MG tablet Take 400 mg by mouth every 6 (six) hours as needed for moderate pain.    Yes Historical Provider, MD  morphine (MS CONTIN) 30 MG 12 hr tablet Take 1 tablet (30 mg total) by mouth every 12 (twelve) hours. 10/25/15  Yes Henrietta HooverLinda C Bernhardt, NP  oxyCODONE-acetaminophen (PERCOCET) 10-325 MG tablet Take 1 tablet by mouth every 4 (four) hours as needed for pain. 11/01/15  Yes Quentin Angstlugbemiga E Jegede, MD  Topiramate ER 100 MG CP24 Take 100 mg by mouth at bedtime.   Yes Historical Provider, MD    Family History Family History  Problem Relation Age of Onset  . Sickle  cell trait Father   . Sickle cell trait Mother   . Sickle cell anemia Other     Social History Social History  Substance Use Topics  . Smoking status: Current Some Day Smoker  . Smokeless tobacco: Never Used     Comment: 08/21/15 - only smokes occasionally  . Alcohol use No     Allergies   Ultram [tramadol]; Zofran [ondansetron hcl]; Buprenorphine hcl; Morphine and related; and Tape   Review of Systems Review of Systems  Constitutional: Positive for fever.  Respiratory: Positive for shortness of breath.   Musculoskeletal: Positive for arthralgias.  All other systems reviewed and are negative.    Physical Exam Updated Vital Signs BP 120/82 (BP Location: Right Arm)   Pulse 94   Temp 100 F (37.8 C) (Oral)   Resp 16   Ht 5' (1.524 m)   Wt 125 lb (56.7 kg)   SpO2 98%   BMI 24.41 kg/m   Physical Exam  Constitutional: She appears well-developed and well-nourished. She is active. No distress.  HENT:  Head: Normocephalic and atraumatic.  Eyes: Conjunctivae are normal.  Cardiovascular: Normal rate.   Pulmonary/Chest: Effort normal. No respiratory distress.  Neurological: She is alert.  Skin: Skin is warm and dry.  Psychiatric: She has a normal mood and affect. Her behavior is normal.  Nursing note and vitals reviewed.  ED Treatments / Results  DIAGNOSTIC STUDIES: Oxygen Saturation is 100% on RA, normal by my interpretation.    COORDINATION OF CARE: 9:45 PM Discussed treatment plan with pt at bedside which includes Chest X-Ray and pt agreed to plan.  Labs (all labs ordered are listed, but only abnormal results are displayed) Labs Reviewed  COMPREHENSIVE METABOLIC PANEL - Abnormal; Notable for the following:       Result Value   Potassium 2.8 (*)    Glucose, Bld 120 (*)    BUN <5 (*)    Calcium 8.8 (*)    AST 47 (*)    Total Bilirubin 2.5 (*)    All other components within normal limits  CBC WITH DIFFERENTIAL/PLATELET - Abnormal; Notable for the following:     WBC 15.0 (*)    RBC 2.36 (*)    Hemoglobin 8.3 (*)    HCT 23.3 (*)    MCH 35.2 (*)    RDW 20.1 (*)    Neutro Abs 9.2 (*)    Lymphs Abs 4.4 (*)    Monocytes Absolute 1.2 (*)    All other components within normal limits  RETICULOCYTES - Abnormal; Notable for the following:    Retic Ct Pct >23.0 (*)    RBC. 2.37 (*)    All other components within normal limits  URINE RAPID DRUG SCREEN, HOSP PERFORMED - Abnormal; Notable for the following:    Opiates POSITIVE (*)  All other components within normal limits    EKG  EKG Interpretation None       Radiology No results found.  Procedures Procedures (including critical care time)  Medications Ordered in ED Medications  0.45 % sodium chloride infusion (not administered)  HYDROmorphone (DILAUDID) injection 1.4 mg (not administered)    Or  HYDROmorphone (DILAUDID) injection 1.4 mg (not administered)     Initial Impression / Assessment and Plan / ED Course  I have reviewed the triage vital signs and the nursing notes.  Pertinent labs & imaging results that were available during my care of the patient were reviewed by me and considered in my medical decision making (see chart for details).  Clinical Course  I personally performed the services described in this documentation, which was scribed in my presence. The recorded information has been reviewed and is accurate. Patient with a history of sickle cell disease presents with continued pain and lower extremities and left lateral rib area.  Despite multiple by mouth doses of oxycodone at home.  She was recently seen and evaluated by her primary care physician and received IV hydration without relief of her discomfort. Final Clinical Impressions(s) / ED Diagnoses   Final diagnoses:  None    New Prescriptions New Prescriptions   No medications on file     Earley Favor, NP 11/21/15 2350    Melene Plan, DO 11/21/15 2351

## 2015-11-21 NOTE — ED Triage Notes (Signed)
Pt states that she is having rib pain; pt states that she took 10mg  Oxycodone at 5pm with no relief; pt states that the pain has been present for 2 days and just has been getting progressively worse

## 2015-11-21 NOTE — Telephone Encounter (Signed)
Advised patient per Dr. Hyman HopesJegede to take pain medication as directed and it is time for the oxycodone again. Also to drink plenty of water and stay hydrated and rest today. Patient understands and agrees.

## 2015-11-21 NOTE — ED Notes (Signed)
NP at the bedside

## 2015-11-21 NOTE — ED Notes (Signed)
Dondra SpryGail, NP made aware that pt's pain did not improve with pain medication.

## 2015-11-21 NOTE — ED Notes (Signed)
Patient presents with PICC to the right chest that was placed outpatient.

## 2015-11-21 NOTE — Telephone Encounter (Signed)
[facility] complaining of 9/10 pain in legs, hips and back. Stating "I don't know what Dr. Hyman HopesJegede wants me to do". Took oxycodone and MS contin at 3 am without relief. Denies chest pain, fever, N/V, diarrhea or abdominal pain.

## 2015-11-21 NOTE — ED Notes (Signed)
Pt called from lobby to room x1  No response

## 2015-11-21 NOTE — ED Notes (Signed)
Patient reports generalized pain which began yesterday.  States pain is worse in hips and legs.

## 2015-11-22 DIAGNOSIS — D5700 Hb-SS disease with crisis, unspecified: Principal | ICD-10-CM

## 2015-11-22 DIAGNOSIS — Z9049 Acquired absence of other specified parts of digestive tract: Secondary | ICD-10-CM | POA: Diagnosis not present

## 2015-11-22 DIAGNOSIS — I950 Idiopathic hypotension: Secondary | ICD-10-CM | POA: Diagnosis not present

## 2015-11-22 DIAGNOSIS — E876 Hypokalemia: Secondary | ICD-10-CM

## 2015-11-22 DIAGNOSIS — Z79899 Other long term (current) drug therapy: Secondary | ICD-10-CM | POA: Diagnosis not present

## 2015-11-22 DIAGNOSIS — F1120 Opioid dependence, uncomplicated: Secondary | ICD-10-CM | POA: Diagnosis present

## 2015-11-22 DIAGNOSIS — F17200 Nicotine dependence, unspecified, uncomplicated: Secondary | ICD-10-CM | POA: Diagnosis present

## 2015-11-22 DIAGNOSIS — Z832 Family history of diseases of the blood and blood-forming organs and certain disorders involving the immune mechanism: Secondary | ICD-10-CM | POA: Diagnosis not present

## 2015-11-22 DIAGNOSIS — F339 Major depressive disorder, recurrent, unspecified: Secondary | ICD-10-CM | POA: Diagnosis present

## 2015-11-22 DIAGNOSIS — F329 Major depressive disorder, single episode, unspecified: Secondary | ICD-10-CM

## 2015-11-22 DIAGNOSIS — G894 Chronic pain syndrome: Secondary | ICD-10-CM | POA: Diagnosis present

## 2015-11-22 DIAGNOSIS — D72829 Elevated white blood cell count, unspecified: Secondary | ICD-10-CM | POA: Diagnosis present

## 2015-11-22 DIAGNOSIS — E871 Hypo-osmolality and hyponatremia: Secondary | ICD-10-CM | POA: Diagnosis not present

## 2015-11-22 LAB — CBC WITH DIFFERENTIAL/PLATELET
Basophils Absolute: 0.1 10*3/uL (ref 0.0–0.1)
Basophils Relative: 0 %
EOS PCT: 2 %
Eosinophils Absolute: 0.3 10*3/uL (ref 0.0–0.7)
HCT: 20.3 % — ABNORMAL LOW (ref 36.0–46.0)
Hemoglobin: 7.1 g/dL — ABNORMAL LOW (ref 12.0–15.0)
LYMPHS ABS: 5.2 10*3/uL — AB (ref 0.7–4.0)
LYMPHS PCT: 36 %
MCH: 34.6 pg — AB (ref 26.0–34.0)
MCHC: 35 g/dL (ref 30.0–36.0)
MCV: 99 fL (ref 78.0–100.0)
MONO ABS: 1.3 10*3/uL — AB (ref 0.1–1.0)
MONOS PCT: 9 %
Neutro Abs: 7.6 10*3/uL (ref 1.7–7.7)
Neutrophils Relative %: 53 %
PLATELETS: 288 10*3/uL (ref 150–400)
RBC: 2.05 MIL/uL — ABNORMAL LOW (ref 3.87–5.11)
RDW: 20.6 % — AB (ref 11.5–15.5)
WBC: 14.4 10*3/uL — ABNORMAL HIGH (ref 4.0–10.5)

## 2015-11-22 LAB — COMPREHENSIVE METABOLIC PANEL
ALT: 28 U/L (ref 14–54)
AST: 41 U/L (ref 15–41)
Albumin: 3.6 g/dL (ref 3.5–5.0)
Alkaline Phosphatase: 65 U/L (ref 38–126)
Anion gap: 6 (ref 5–15)
BILIRUBIN TOTAL: 2.1 mg/dL — AB (ref 0.3–1.2)
BUN: 6 mg/dL (ref 6–20)
CHLORIDE: 112 mmol/L — AB (ref 101–111)
CO2: 23 mmol/L (ref 22–32)
CREATININE: 0.39 mg/dL — AB (ref 0.44–1.00)
Calcium: 8.4 mg/dL — ABNORMAL LOW (ref 8.9–10.3)
Glucose, Bld: 138 mg/dL — ABNORMAL HIGH (ref 65–99)
POTASSIUM: 3.1 mmol/L — AB (ref 3.5–5.1)
Sodium: 141 mmol/L (ref 135–145)
TOTAL PROTEIN: 6.4 g/dL — AB (ref 6.5–8.1)

## 2015-11-22 LAB — LACTATE DEHYDROGENASE: LDH: 230 U/L — AB (ref 98–192)

## 2015-11-22 LAB — MAGNESIUM: MAGNESIUM: 1.8 mg/dL (ref 1.7–2.4)

## 2015-11-22 MED ORDER — NALOXONE HCL 0.4 MG/ML IJ SOLN: 0.4000 mg | INTRAMUSCULAR | Status: DC | PRN

## 2015-11-22 MED ORDER — SODIUM CHLORIDE 0.9% FLUSH: 9.0000 mL | INTRAVENOUS | Status: DC | PRN

## 2015-11-22 MED ORDER — ONDANSETRON HCL 4 MG/2ML IJ SOLN: 4.0000 mg | Freq: Four times a day (QID) | INTRAMUSCULAR | Status: DC | PRN

## 2015-11-22 MED ORDER — HYDROMORPHONE 1 MG/ML IV SOLN
INTRAVENOUS | Status: DC
  Administered 2015-11-22: 30 mg via INTRAVENOUS
  Administered 2015-11-22: 9 mg via INTRAVENOUS
  Filled 2015-11-22: qty 25

## 2015-11-22 MED ORDER — HYDROMORPHONE 1 MG/ML IV SOLN
INTRAVENOUS | Status: DC
  Administered 2015-11-22: 11:00:00 via INTRAVENOUS
  Administered 2015-11-22: 12 mg via INTRAVENOUS
  Administered 2015-11-22: 19:00:00 via INTRAVENOUS
  Administered 2015-11-23: 18 mg via INTRAVENOUS
  Administered 2015-11-23: 3.6 mg via INTRAVENOUS
  Filled 2015-11-22 (×3): qty 25

## 2015-11-22 MED ORDER — FOLIC ACID 1 MG PO TABS
1.0000 mg | ORAL_TABLET | Freq: Every day | ORAL | Status: DC
  Administered 2015-11-22 – 2015-11-23 (×2): 1 mg via ORAL
  Filled 2015-11-22 (×2): qty 1

## 2015-11-22 MED ORDER — MORPHINE SULFATE ER 30 MG PO TBCR
30.0000 mg | EXTENDED_RELEASE_TABLET | Freq: Two times a day (BID) | ORAL | Status: DC
  Administered 2015-11-22 – 2015-11-23 (×4): 30 mg via ORAL
  Filled 2015-11-22 (×4): qty 1

## 2015-11-22 MED ORDER — SODIUM CHLORIDE 0.45 % IV SOLN
INTRAVENOUS | Status: DC
  Administered 2015-11-22: 02:00:00 via INTRAVENOUS
  Filled 2015-11-22 (×2): qty 1000

## 2015-11-22 MED ORDER — SODIUM CHLORIDE 0.45 % IV SOLN
INTRAVENOUS | Status: DC
  Administered 2015-11-22: 03:00:00 via INTRAVENOUS

## 2015-11-22 MED ORDER — SODIUM CHLORIDE 0.9 % IV SOLN
12.5000 mg | INTRAVENOUS | Status: DC | PRN
  Filled 2015-11-22: qty 0.25

## 2015-11-22 MED ORDER — POTASSIUM CHLORIDE 2 MEQ/ML IV SOLN
INTRAVENOUS | Status: DC
  Administered 2015-11-22 – 2015-11-23 (×3): via INTRAVENOUS
  Filled 2015-11-22 (×7): qty 1000

## 2015-11-22 MED ORDER — TOPIRAMATE 25 MG PO TABS
50.0000 mg | ORAL_TABLET | Freq: Two times a day (BID) | ORAL | Status: DC
  Administered 2015-11-22 – 2015-11-23 (×3): 50 mg via ORAL
  Filled 2015-11-22 (×4): qty 2

## 2015-11-22 MED ORDER — DULOXETINE HCL 60 MG PO CPEP
60.0000 mg | ORAL_CAPSULE | Freq: Every day | ORAL | Status: DC
  Administered 2015-11-22 – 2015-11-23 (×2): 60 mg via ORAL
  Filled 2015-11-22 (×2): qty 1

## 2015-11-22 MED ORDER — HYDROXYUREA 500 MG PO CAPS
500.0000 mg | ORAL_CAPSULE | Freq: Every day | ORAL | Status: DC
  Administered 2015-11-22 – 2015-11-23 (×2): 500 mg via ORAL
  Filled 2015-11-22 (×2): qty 1

## 2015-11-22 MED ORDER — POTASSIUM CHLORIDE CRYS ER 20 MEQ PO TBCR
40.0000 meq | EXTENDED_RELEASE_TABLET | Freq: Every day | ORAL | Status: DC
  Administered 2015-11-23: 40 meq via ORAL
  Filled 2015-11-22 (×2): qty 2

## 2015-11-22 MED ORDER — POTASSIUM CHLORIDE CRYS ER 20 MEQ PO TBCR
40.0000 meq | EXTENDED_RELEASE_TABLET | Freq: Once | ORAL | Status: AC
  Administered 2015-11-22: 40 meq via ORAL
  Filled 2015-11-22: qty 2

## 2015-11-22 MED ORDER — OXYCODONE-ACETAMINOPHEN 5-325 MG PO TABS: 1.0000 | ORAL_TABLET | ORAL | Status: DC | PRN

## 2015-11-22 MED ORDER — POLYETHYLENE GLYCOL 3350 17 G PO PACK: 17.0000 g | PACK | Freq: Every day | ORAL | Status: DC | PRN

## 2015-11-22 MED ORDER — SENNOSIDES-DOCUSATE SODIUM 8.6-50 MG PO TABS
1.0000 | ORAL_TABLET | Freq: Two times a day (BID) | ORAL | Status: DC
  Administered 2015-11-22 – 2015-11-23 (×3): 1 via ORAL
  Filled 2015-11-22 (×4): qty 1

## 2015-11-22 MED ORDER — TOPIRAMATE ER 100 MG PO CAP24: 100.0000 mg | ORAL_CAPSULE | Freq: Every day | ORAL | Status: DC

## 2015-11-22 MED ORDER — DIPHENHYDRAMINE HCL 25 MG PO CAPS
25.0000 mg | ORAL_CAPSULE | ORAL | Status: DC | PRN
  Administered 2015-11-22: 50 mg via ORAL
  Filled 2015-11-22: qty 2

## 2015-11-22 MED ORDER — MAGNESIUM SULFATE 2 GM/50ML IV SOLN
2.0000 g | Freq: Once | INTRAVENOUS | Status: AC
  Administered 2015-11-22: 2 g via INTRAVENOUS
  Filled 2015-11-22: qty 50

## 2015-11-22 MED ORDER — KETOROLAC TROMETHAMINE 30 MG/ML IJ SOLN
30.0000 mg | Freq: Four times a day (QID) | INTRAMUSCULAR | Status: DC
  Administered 2015-11-22 – 2015-11-23 (×6): 30 mg via INTRAVENOUS
  Filled 2015-11-22 (×6): qty 1

## 2015-11-22 MED ORDER — IBUPROFEN 200 MG PO TABS: 400.0000 mg | ORAL_TABLET | Freq: Four times a day (QID) | ORAL | Status: DC | PRN

## 2015-11-22 MED ORDER — ENOXAPARIN SODIUM 40 MG/0.4ML ~~LOC~~ SOLN
40.0000 mg | SUBCUTANEOUS | Status: DC
  Filled 2015-11-22 (×2): qty 0.4

## 2015-11-22 MED ORDER — OXYCODONE HCL 5 MG PO TABS
5.0000 mg | ORAL_TABLET | ORAL | Status: DC | PRN
Start: 2015-11-22 — End: 2015-11-23

## 2015-11-22 MED ORDER — OXYCODONE-ACETAMINOPHEN 10-325 MG PO TABS: 1.0000 | ORAL_TABLET | ORAL | Status: DC | PRN

## 2015-11-22 NOTE — H&P (Signed)
[facility] History and Physical   Patient: Brittany Foster Negro QIO:[id number]   PCP: Jeanann LewandowskyJEGEDE, OLUGBEMIGA, MD DOB: 01/18/1986   DOA: 11/21/2015   DOS: 11/22/2015   DOS: the patient was seen and examined on 11/21/2015  Patient coming from: The patient is coming from home.  Chief Complaint: Sickle cell pain  HPI: Brittany Foster Brittany Foster is a 30 y.o. female with Past medical history of Sickle cell disease. Patient presented with complains of ongoing back pain as well as joint pain and leg pain. She also has some rib cage pain. Also, some nausea. No diarrhea no constipation no vomiting. She mentions that she has been taking the oxycodone despite that the pain has not been controlled. She mentions is compliant with all her medication although recent documentation mentions that the patient has not been adherent with her medications.  ED Course: Patient was given multiple rounds of IV Dilaudid despite which her pain was not controlled and the patient will admission.  At her baseline ambulates Without support And is independent for most of her ADL; manages her medication on her own.  Review of Systems: as mentioned in the history of present illness.  All other systems reviewed and are negative.  Past Medical History:  Diagnosis Date  . Anemia   . Depression, major, recurrent (HCC)   . Migraines   . Sickle cell anemia (HCC)    Past Surgical History:  Procedure Laterality Date  . CESAREAN SECTION    . CHOLECYSTECTOMY  2000  . IR GENERIC HISTORICAL  10/08/2015   IR US GUIDE VASC ACCESS RIGHT 10/08/2015 Simonne ComeJohn Watts, MD [facility]  . IR GENERIC HISTORICAL  10/08/2015   IR FLUORO GUIDE CV LINE RIGHT 10/08/2015 Simonne ComeJohn Watts, MD [facility]  . MULTIPLE TOOTH EXTRACTIONS N/A   . port a cath placement Right    about 6-7 years ago  . removal of porta cath Right 09/11/15  . TUBAL LIGATION     Social History:  reports that she has been smoking.  She has never used smokeless tobacco. She reports that she does not  drink alcohol or use drugs.  Allergies  Allergen Reactions  . Ultram [Tramadol] Other (See Comments)    seizures  . Zofran [Ondansetron Hcl] Nausea And Vomiting  . Buprenorphine Hcl Hives and Rash    Shaking Tolerates Percocet, Norco, and buprenorphine  . Morphine And Related Hives, Rash and Other (See Comments)    Shaking Tolerates Percocet, Norco, Dilaudid, and buprenorphine  . Tape Rash     Family History  Problem Relation Age of Onset  . Sickle cell trait Father   . Sickle cell trait Mother   . Sickle cell anemia Other      Prior to Admission medications   Medication Sig Start Date End Date Taking? Authorizing Provider  DULoxetine (CYMBALTA) 60 MG capsule Take 1 capsule (60 mg total) by mouth daily. 08/08/15  Yes Massie MaroonLachina M [name], FNP  folic acid (FOLVITE) 1 MG tablet Take 1 tablet (1 mg total) by mouth daily. 08/08/15  Yes Massie MaroonLachina M [name], FNP  hydroxyurea (HYDREA) 500 MG capsule TAKE 2 CAPSULES BY MOUTH DAILY. MAY TAKE WITH FOOD TO MINIMIZE GI SIDE EFFECTS. Patient taking differently: Take 500 mg by mouth daily. MAY TAKE WITH FOOD TO MINIMIZE GI SIDE EFFECTS. 08/08/15  Yes Massie MaroonLachina M [name], FNP  ibuprofen (ADVIL,MOTRIN) 200 MG tablet Take 400 mg by mouth every 6 (six) hours as needed for moderate pain.    Yes Historical Provider, MD  morphine (MS  CONTIN) 30 MG 12 hr tablet Take 1 tablet (30 mg total) by mouth every 12 (twelve) hours. 10/25/15  Yes Henrietta Hoover, NP  oxyCODONE-acetaminophen (PERCOCET) 10-325 MG tablet Take 1 tablet by mouth every 4 (four) hours as needed for pain. 11/01/15  Yes Quentin Angst, MD  Topiramate ER 100 MG CP24 Take 100 mg by mouth at bedtime.   Yes Historical Provider, MD    Physical Exam: Vitals:   11/21/15 2252 11/21/15 2330 11/21/15 2352 11/22/15 0107  BP: 101/60  93/67 101/60  Pulse: 89 85 91 76  Resp: 20 17 20 18   Temp:    99.7 F (37.6 C)  TempSrc:    Oral  SpO2: 100% 96% 98% 96%  Weight:    56.7 kg (125 lb)  Height:    5'  (1.524 m)    General: Alert, Awake and Oriented to Time, Place and Person. Appear in mild distress, affect appropriate Eyes: PERRL, Conjunctiva normal ENT: Oral Mucosa clear moist. Neck: no JVD, no Abnormal Mass Or lumps Cardiovascular: S1 and S2 Present, no Murmur, Peripheral Pulses Present Respiratory: Bilateral Air entry equal and Decreased, no use of accessory muscle, Clear to Auscultation, no Crackles, no wheezes Abdomen: Bowel Sound present, Soft and no tenderness Skin: no redness, no Rash, no induration Extremities: no Pedal edema, no calf tenderness Neurologic: Grossly no focal neuro deficit. Bilaterally Equal motor strength  Labs on Admission:  CBC:  Recent Labs Lab 11/15/15 0930 11/20/15 1004 11/21/15 2017  WBC 13.7* 13.7* 15.0*  NEUTROABS 9.1* 8.0* 9.2*  HGB 8.0* 7.9* 8.3*  HCT 22.8* 22.5* 23.3*  MCV 98.7 100.0 98.7  PLT 443* 349 357   Basic Metabolic Panel:  Recent Labs Lab 11/21/15 2017  NA 140  K 2.8*  CL 110  CO2 22  GLUCOSE 120*  BUN <5*  CREATININE 0.45  CALCIUM 8.8*   GFR: Estimated Creatinine Clearance: 80.4 mL/min (by C-G formula based on SCr of 0.45 mg/dL). Liver Function Tests:  Recent Labs Lab 11/21/15 2017  AST 47*  ALT 30  ALKPHOS 77  BILITOT 2.5*  PROT 7.6  ALBUMIN 4.2   No results for input(s): LIPASE, AMYLASE in the last 168 hours. No results for input(s): AMMONIA in the last 168 hours. Coagulation Profile: No results for input(s): INR, PROTIME in the last 168 hours. Cardiac Enzymes: No results for input(s): CKTOTAL, CKMB, CKMBINDEX, TROPONINI in the last 168 hours. BNP (last 3 results) No results for input(s): PROBNP in the last 8760 hours. HbA1C: No results for input(s): HGBA1C in the last 72 hours. CBG: No results for input(s): GLUCAP in the last 168 hours. Lipid Profile: No results for input(s): CHOL, HDL, LDLCALC, TRIG, CHOLHDL, LDLDIRECT in the last 72 hours. Thyroid Function Tests: No results for input(s): TSH,  T4TOTAL, FREET4, T3FREE, THYROIDAB in the last 72 hours. Anemia Panel:  Recent Labs  11/20/15 1004 11/21/15 2017  RETICCTPCT 20.5* >23.0*   Urine analysis:    Component Value Date/Time   COLORURINE AMBER (A) 11/20/2015 1004   APPEARANCEUR CLEAR 11/20/2015 1004   LABSPEC 1.013 11/20/2015 1004   PHURINE 6.5 11/20/2015 1004   GLUCOSEU NEGATIVE 11/20/2015 1004   HGBUR NEGATIVE 11/20/2015 1004   BILIRUBINUR NEGATIVE 11/20/2015 1004   KETONESUR NEGATIVE 11/20/2015 1004   PROTEINUR NEGATIVE 11/20/2015 1004   UROBILINOGEN 1.0 11/13/2014 1338   NITRITE NEGATIVE 11/20/2015 1004   LEUKOCYTESUR NEGATIVE 11/20/2015 1004    Radiological Exams on Admission: No results found.  Chest x-ray shows no focal  infiltrates independently reviewed, report currently pending.  EKG: Independently reviewed. normal sinus rhythm, nonspecific ST and T waves changes.  Assessment/Plan 1. Sickle cell anemia with crisis (HCC) Starting the patient on IV Dilaudid PCA, IV half normal saline  With potassium. Continue home regimen for the pain. Monitor daily CBC.  2.hypokalemia. Potassium level will replace. Recheck in the morning.  3.  Mood disorder. Chronic pain syndrome Continue Cymbalta.   Nutrition: Regular diet DVT Prophylaxis: subcutaneous Heparin  Advance goals of care discussion: Full code   Consults: None  Family Communication: family was present at bedside, at the time of interview.  Opportunity was given to ask question and all questions were answered satisfactorily.  Disposition: Admitted as observation, med-surge unit. Likely to be discharged home, in 2 days.  Author: Lynden Oxford, MD [facility] Pager: [phone] 11/22/2015  If 7PM-7AM, please contact night-coverage [email or web address] Password [email or web address]

## 2015-11-22 NOTE — ED Notes (Signed)
Pt reports itching.  Dr. Allena KatzPatel aware and gave verbal order to 25mg  PO of benadryl.

## 2015-11-22 NOTE — Progress Notes (Signed)
SICKLE CELL SERVICE PROGRESS NOTE  Raegyn Renda ZOX:[id number] DOB: [birth year] DOA: 11/21/2015 PCP: Jeanann Lewandowsky, MD  Assessment/Plan: Principal Problem:   Sickle cell anemia with crisis (HCC) Active Problems:   Depression   Chronic pain syndrome  1. Hb SS with crisis: Continue PCA at a bolus to 0.6  and increase 1 hour limit of 3.6 mg and continue Percocet, MS Contin, Toradol and IVF.  Will re-assess pain tomorrow.  2. Leukocytosis:A review of labs shows a chronic leukocytosis. WBC at baseline. 3. Hypokalemia:I feel that she will require chronic replacement as her potassium is always low when admitted. Will schedule oral potassium.  4. Anemia of chronic disease: Hb stable at baseline.  5. Chronic pain: Pt pain is most consistent with chronic pain syndrome. Additionally she refuses to adhere to [facility] which has been shown to decrease the frequency of crises.Medication non-adherence: Pt has been prescribed Hydrea 1000 mg daily but takes only 500 mg which is sub-therapeutic.  6. Psychosocial:  I have spoken with Dr. Hyman Hopes and he agrees that her pain is by and large chronic in nature and that she lacks motivation to comply with care that is beneficial for her disease. She has refused services from Psychotherapy and Social Work and per Dr. Hyman Hopes she has told him that she does not take her oral analgesics but presents frequently for IV medications.     Code Status: Full Code Family Communication: N/A Disposition Plan: Not yet ready for discharge  Brittany Foster A.  Pager [phone]. If 7PM-7AM, please contact night-coverage.  11/22/2015, 3:31 PM  LOS: 0 days   Interim History: Pt reports pain in hips, back and ribs still at a level of 10/10.     Consultants:  None  Procedures:  None  Antibiotics:  None   Objective: Vitals:   11/22/15 0800 11/22/15 0951 11/22/15 1040 11/22/15 1407  BP:  (!) 86/44  93/63  Pulse:  76  86  Resp: 20 16 20 10   Temp:  98.4 F (36.9 C)   98.2 F (36.8 C)  TempSrc:  Oral  Oral  SpO2: 95% 94% 95% 96%  Weight:      Height:       Weight change:   Intake/Output Summary (Last 24 hours) at 11/22/15 1531 Last data filed at 11/22/15 1454  Gross per 24 hour  Intake           496.67 ml  Output                0 ml  Net           496.67 ml    General: Alert, awake, oriented x3, in moderate distress secondary to pain.  She is uncooperative with examination. HEENT: [place]/AT PEERL, EOMI, anicteric Neck: Trachea midline,  no masses, no thyromegal,y no JVD, no carotid bruit OROPHARYNX:  Moist, No exudate/ erythema/lesions.  Heart: Regular rate and rhythm, without murmurs, rubs, gallops, PMI non-displaced, no heaves or thrills on palpation.  Lungs: refusing positioning for lung examination. Abdomen: Soft, nontender, nondistended, positive bowel sounds, no masses no hepatosplenomegaly noted.  Neuro: No focal neurological deficits noted cranial nerves II through XII grossly intact. Strength at functional baseline in bilateral upper and lower extremities. Musculoskeletal: No warmth swelling or erythema around joints, no spinal tenderness noted. Psychiatric: Patient alert and oriented x3, good cognition, good recent to remote recall.    Data Reviewed: Basic Metabolic Panel:  Recent Labs Lab 11/21/15 2017 11/22/15 0615  NA 140 141  K 2.8* 3.1*  CL 110 112*  CO2 22 23  GLUCOSE 120* 138*  BUN <5* 6  CREATININE 0.45 0.39*  CALCIUM 8.8* 8.4*  MG  --  1.8   Liver Function Tests:  Recent Labs Lab 11/21/15 2017 11/22/15 0615  AST 47* 41  ALT 30 28  ALKPHOS 77 65  BILITOT 2.5* 2.1*  PROT 7.6 6.4*  ALBUMIN 4.2 3.6   No results for input(s): LIPASE, AMYLASE in the last 168 hours. No results for input(s): AMMONIA in the last 168 hours. CBC:  Recent Labs Lab 11/20/15 1004 11/21/15 2017 11/22/15 0615  WBC 13.7* 15.0* 14.4*  NEUTROABS 8.0* 9.2* 7.6  HGB 7.9* 8.3* 7.1*  HCT 22.5* 23.3* 20.3*  MCV 100.0 98.7 99.0  PLT  349 357 288   Cardiac Enzymes: No results for input(s): CKTOTAL, CKMB, CKMBINDEX, TROPONINI in the last 168 hours. BNP (last 3 results) No results for input(s): BNP in the last 8760 hours.  ProBNP (last 3 results) No results for input(s): PROBNP in the last 8760 hours.  CBG: No results for input(s): GLUCAP in the last 168 hours.  No results found for this or any previous visit (from the past 240 hour(s)).   Studies: Dg Chest 2 View  Result Date: 11/21/2015 CLINICAL DATA:  Generalized chest pain for 2 days. History of sickle cell. EXAM: CHEST  2 VIEW COMPARISON:  11/04/2015 FINDINGS: Right central venous catheter with tip over the cavoatrial junction. No pneumothorax. Normal heart size and pulmonary vascularity. No focal airspace disease or consolidation in the lungs. No blunting of costophrenic angles. No pneumothorax. Mediastinal contours appear intact. IMPRESSION: No active cardiopulmonary disease. Electronically Signed   By: Burman NievesWilliam  Stevens M.D.   On: 11/21/2015 22:59   Dg Chest 2 View  Result Date: 11/04/2015 CLINICAL DATA:  30 y/o female with sickle cell crisis onset today, c/o LEFT CP and leg pain. Hx heart murmur, MI, PE, former smoker. EXAM: CHEST  2 VIEW COMPARISON:  10/19/2015 FINDINGS: Right central line tip overlies the level of superior vena cava. Heart size is normal. Lungs are clear. Visualized osseous structures have a normal appearance. Surgical clips are noted in the right upper quadrant the abdomen. IMPRESSION: No active cardiopulmonary disease. Electronically Signed   By: Norva PavlovElizabeth  Brown M.D.   On: 11/04/2015 18:10    Scheduled Meds: . DULoxetine  60 mg Oral Daily  . enoxaparin (LOVENOX) injection  40 mg Subcutaneous Q24H  . folic acid  1 mg Oral Daily  . HYDROmorphone   Intravenous Q4H  .  HYDROmorphone (DILAUDID) injection  1.4 mg Intravenous Once  . hydroxyurea  500 mg Oral Daily  . ketorolac  30 mg Intravenous Q6H  . morphine  30 mg Oral Q12H  . potassium  chloride  40 mEq Oral Daily  . senna-docusate  1 tablet Oral BID  . topiramate  50 mg Oral BID   Continuous Infusions: . dextrose 5 % and 0.45% NaCl 1,000 mL with potassium chloride 40 mEq infusion 125 mL/hr at 11/22/15 1140     In excess of 25 minutes was spent during this visit. Greater than 50% involved face to face contact with the patient for assessment, counseling and coordination of care.

## 2015-11-22 NOTE — ED Notes (Signed)
Pt states she doesn't want to take the PO benadryl.

## 2015-11-22 NOTE — Progress Notes (Signed)
PHARMACIST - PHYSICIAN COMMUNICATION  DR:   Ashley RoyaltyMatthews CONCERNING: IV to Oral Route Change Policy  RECOMMENDATION: This patient is receiving diphenhydramine by the intravenous route.  Based on criteria approved by the Pharmacy and Therapeutics Committee, intravenous diphenhydramine is being converted to the equivalent oral dose form(s).   DESCRIPTION: These criteria include:  Diphenhydramine is not prescribed to treat or prevent a severe allergic reaction  Diphenhydramine is not prescribed as premedication prior to receiving blood product, biologic medication, antimicrobial, or chemotherapy agent  The patient has tolerated at least one dose of an oral or enteral medication  The patient has no evidence of active gastrointestinal bleeding or impaired GI absorption (gastrectomy, short bowel, patient on TNA or NPO).  The patient is not undergoing procedural sedation   If you have questions about this conversion, please contact the Pharmacy Department  []   [phone]( [phone] )  [facility] []   [phone]( [phone] )  [facility] []   [phone]( [phone] )  [facility] []   [phone]( [phone] )  [facility] [x]   [phone]( [phone] )  [facility] PHARMACIST - PHYSICIAN COMMUNICATION   Otho BellowsGreen, Quinlan Vollmer L PharmD Pager [phone] 11/22/2015, 9:48 AM

## 2015-11-23 DIAGNOSIS — F1910 Other psychoactive substance abuse, uncomplicated: Secondary | ICD-10-CM

## 2015-11-23 DIAGNOSIS — I950 Idiopathic hypotension: Secondary | ICD-10-CM

## 2015-11-23 DIAGNOSIS — E871 Hypo-osmolality and hyponatremia: Secondary | ICD-10-CM

## 2015-11-23 LAB — BASIC METABOLIC PANEL
Anion gap: 3 — ABNORMAL LOW (ref 5–15)
BUN: 6 mg/dL (ref 6–20)
CHLORIDE: 114 mmol/L — AB (ref 101–111)
CO2: 21 mmol/L — AB (ref 22–32)
CREATININE: 0.34 mg/dL — AB (ref 0.44–1.00)
Calcium: 8.5 mg/dL — ABNORMAL LOW (ref 8.9–10.3)
GFR calc Af Amer: >60 mL/min (ref >60)
GFR calc non Af Amer: >60 mL/min (ref >60)
Glucose, Bld: 91 mg/dL (ref 65–99)
POTASSIUM: 4.5 mmol/L (ref 3.5–5.1)
Sodium: 138 mmol/L (ref 135–145)

## 2015-11-23 MED ORDER — POTASSIUM CHLORIDE CRYS ER 20 MEQ PO TBCR: 40.0000 meq | EXTENDED_RELEASE_TABLET | Freq: Every day | ORAL | 1 refills | Status: DC

## 2015-11-23 MED ORDER — HEPARIN SOD (PORK) LOCK FLUSH 100 UNIT/ML IV SOLN
500.0000 [IU] | Freq: Once | INTRAVENOUS | Status: DC
  Filled 2015-11-23: qty 5

## 2015-11-23 NOTE — Discharge Summary (Addendum)
Brittany Foster MRN: [id number] DOB/AGE: 31/03/1985 31 y.o.  Admit date: 11/21/2015 Discharge date: 11/23/2015  Primary Care Physician:  Jeanann LewandowskyJEGEDE, OLUGBEMIGA, MD   Discharge Diagnoses:   Patient Active Problem List   Diagnosis Date Noted  . Vasoocclusive sickle cell crisis (HCC) 08/21/2015  . Depression, major, recurrent (HCC)   . Family planning, Depo-Provera contraception monitoring/administration 08/08/2015  . Sickle cell anemia with crisis (HCC) 07/12/2015  . Sickle cell anemia with pain (HCC) 05/28/2015  . Intractable pain   . Chest pain 03/07/2015  . Leukocytosis 03/07/2015  . Vitamin D deficiency 01/08/2015  . Chronic pain syndrome 01/08/2015  . Sickle cell crisis (HCC) 10/22/2014  . Hypokalemia 08/21/2014  . Depression 08/09/2014  . 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 Sickle cell disease with crisis (HCC) 07/12/2014  . Hb-SS disease with crisis (HCC) 06/11/2014  . Pericardial effusion   . Anemia 02/09/2014  . Neuropathy (HCC) 02/09/2014  . Chronic headache disorder 02/09/2014  . Abnormal CT scan, chest   . Sickle cell pain crisis (HCC) 12/30/2013    DISCHARGE MEDICATION:   Medication List    TAKE these medications   DULoxetine 60 MG capsule Commonly known as:  CYMBALTA Take 1 capsule (60 mg total) by mouth daily.   folic acid 1 MG tablet Commonly known as:  FOLVITE Take 1 tablet (1 mg total) by mouth daily.   hydroxyurea 500 MG capsule Commonly known as:  HYDREA TAKE 2 CAPSULES BY MOUTH DAILY. MAY TAKE WITH FOOD TO MINIMIZE GI SIDE EFFECTS. What changed:  how much to take  how to take this  when to take this  additional instructions   ibuprofen 200 MG tablet Commonly known as:  ADVIL,MOTRIN Take 400 mg by mouth every 6 (six) hours as needed for moderate pain.   morphine 30 MG 12 hr tablet Commonly known as:  MS CONTIN Take 1 tablet (30 mg total) by mouth every 12 (twelve) hours.   oxyCODONE-acetaminophen 10-325 MG tablet Commonly  known as:  PERCOCET Take 1 tablet by mouth every 4 (four) hours as needed for pain.   potassium chloride SA 20 MEQ tablet Commonly known as:  K-DUR,KLOR-CON Take 2 tablets (40 mEq total) by mouth daily.   Topiramate ER 100 MG Cp24 Take 100 mg by mouth at bedtime.         Consults:    SIGNIFICANT DIAGNOSTIC STUDIES:  Dg Chest 2 View  Result Date: 11/21/2015 CLINICAL DATA:  Generalized chest pain for 2 days. History of sickle cell. EXAM: CHEST  2 VIEW COMPARISON:  11/04/2015 FINDINGS: Right central venous catheter with tip over the cavoatrial junction. No pneumothorax. Normal heart size and pulmonary vascularity. No focal airspace disease or consolidation in the lungs. No blunting of costophrenic angles. No pneumothorax. Mediastinal contours appear intact. IMPRESSION: No active cardiopulmonary disease. Electronically Signed   By: Burman NievesWilliam  Stevens M.D.   On: 11/21/2015 22:59   Dg Chest 2 View  Result Date: 11/04/2015 CLINICAL DATA:  30 y/o female with sickle cell crisis onset today, c/o LEFT CP and leg pain. Hx heart murmur, MI, PE, former smoker. EXAM: CHEST  2 VIEW COMPARISON:  10/19/2015 FINDINGS: Right central line tip overlies the level of superior vena cava. Heart size is normal. Lungs are clear. Visualized osseous structures have a normal appearance. Surgical clips are noted in the right upper quadrant the abdomen. IMPRESSION: No active cardiopulmonary disease. Electronically Signed   By: Norva PavlovElizabeth  Brown M.D.   On: 11/04/2015 18:10  No results found for this or any previous visit (from the past 240 hour(s)).  BRIEF ADMITTING H & P: Brittany Foster is a 30 y.o. female with Past medical history of Sickle cell disease. Patient presented with complains of ongoing back pain as well as joint pain and leg pain. She also has some rib cage pain. Also, some nausea. No diarrhea no constipation no vomiting. She mentions that she has been taking the oxycodone despite that the pain has not  been controlled. She mentions is compliant with all her medication although recent documentation mentions that the patient has not been adherent with her medications.   Hospital Course:  Present on Admission: Pt with issues of Hb SS, Chronic pain syndrome and chronically low BP admitted due to suspected sickle cell crisis. Pt was noted to have both hypokalemia and hypomagnesemia both of which resolved with IV replacement as patient refused oral supplementation.  Pt was started onThe Dilaudid via PCA and continue MS Contin. Patient initially refused taking Toradol, potassium for potassium replacement and oral Benadryl. Patient demanded that her Benadryl be by IV despite the fact that the patient was not actively scratching her skin. Patient also refused her Percocet and demanded that she received Dilaudid by IV push in combination with her IV Benadryl. This is in a setting where the patient is able to have oral intake without any difficulty. When it discussion was attempted with the patient regarding the root of her medication delivery the patient became verbally offensive and stated that none of the doctors initially she came in contact with or will do the job by giving her IV pain medicines. I spoke with the patient's primary care provider Dr. Hyman Hopes who stated his concern that the patient had a problem with chronic dependency on pain medications and also his concern for the fact that the patient was presenting frequently for IV Dilaudid. His recommendations the patient be discharged home as long as she did not exhibit any medical risk and was functional in her activity. The patient has refused her examination today and is refusing to discuss the condition of her pain on the she is given Dilaudid by IV push and IV Benadryl. I've made several times to assess and examined the patient. However she continues to refuse. I have assessed that the patient's vital signs are stable, she is without any fevers and  without any need for oxygen as she saturating 100% on room air. The patient is able to move in the bed without any difficulty and she is fully alert and oriented and able to carry on a lucid conversation. Given the absence of discernible medical risk I am discharging the patient home on her previous home medications with instructions to follow-up with her primary care physician as previously agreed upon between him and the patient. The patient does have a chronically low BP but has no dizziness or lightheadedness and has a normal urine output.    Disposition and Follow-up: Patient discharged in stable condition.    DISCHARGE EXAM:  General: Alert, awake, oriented x3, in no apparent distress.  HEENT: [place]/AT PEERL, EOMI, anicteric Neck: Trachea midline, no masses noted. Pt refused examination. OROPHARYNX: Pt refused examination. Heart: Pt refused examination Pulmonary/Chest: Pt refused examination. However respirations non-labored RR noted to be 14/minute. Also pulse-oximetry 100 % on RA. Abdomen: Pt refused examination.  Neuro: Alert and oriented to person, place and time. Normal motor skills, Displays no atrophy or tremors and exhibits normal muscle tone.  No focal neurological  deficits noted cranial nerves II through XII grossly intact.  Strength at baseline in bilateral upper and lower extremities. Gait normal. Musculoskeletal:Examination refused. Psychiatric: Patient alert and oriented x3,poor insight into condition and poor judgement. Pt exhibits a very angry mood, but memory normal. Skin: No bruising, no ecchymosis and no rash noted. Pt is not diaphoretic. No erythema. No pallor    Blood pressure (!) 95/57, pulse 69, temperature 98 F (36.7 C), temperature source Oral, resp. rate 16, height 5' (1.524 m), weight 56.7 kg (125 lb), SpO2 96 % RA.   Recent Labs  11/22/15 0615 11/23/15 1154  NA 141 138  K 3.1* 4.5  CL 112* 114*  CO2 23 21*  GLUCOSE 138* 91  BUN 6 6  CREATININE  0.39* 0.34*  CALCIUM 8.4* 8.5*  MG 1.8  --     Recent Labs  11/21/15 2017 11/22/15 0615  AST 47* 41  ALT 30 28  ALKPHOS 77 65  BILITOT 2.5* 2.1*  PROT 7.6 6.4*  ALBUMIN 4.2 3.6   No results for input(s): LIPASE, AMYLASE in the last 72 hours.  Recent Labs  11/21/15 2017 11/22/15 0615  WBC 15.0* 14.4*  NEUTROABS 9.2* 7.6  HGB 8.3* 7.1*  HCT 23.3* 20.3*  MCV 98.7 99.0  PLT 357 288     Total time spent including face to face and decision making was greater than 30 minutes  Signed: Dashana Guizar A. 11/23/2015, 12:44 PM

## 2015-11-26 ENCOUNTER — Encounter (HOSPITAL_COMMUNITY): Payer: Self-pay | Admitting: *Deleted

## 2015-11-26 ENCOUNTER — Telehealth (HOSPITAL_COMMUNITY): Payer: Self-pay | Admitting: *Deleted

## 2015-11-26 ENCOUNTER — Other Ambulatory Visit: Payer: Self-pay | Admitting: Internal Medicine

## 2015-11-26 ENCOUNTER — Non-Acute Institutional Stay (HOSPITAL_COMMUNITY)
Admission: AD | Admit: 2015-11-26 | Discharge: 2015-11-26 | Disposition: A | Discharge status: Home / Self Care | Payer: Medicaid Other | Source: Ambulatory Visit | Attending: Internal Medicine | Admitting: Internal Medicine

## 2015-11-26 ENCOUNTER — Other Ambulatory Visit: Payer: Self-pay | Admitting: Family Medicine

## 2015-11-26 ENCOUNTER — Telehealth: Payer: Self-pay

## 2015-11-26 DIAGNOSIS — D5700 Hb-SS disease with crisis, unspecified: Secondary | ICD-10-CM | POA: Diagnosis present

## 2015-11-26 DIAGNOSIS — R52 Pain, unspecified: Secondary | ICD-10-CM | POA: Diagnosis present

## 2015-11-26 DIAGNOSIS — F329 Major depressive disorder, single episode, unspecified: Secondary | ICD-10-CM | POA: Insufficient documentation

## 2015-11-26 DIAGNOSIS — G894 Chronic pain syndrome: Secondary | ICD-10-CM

## 2015-11-26 DIAGNOSIS — Z79899 Other long term (current) drug therapy: Secondary | ICD-10-CM | POA: Diagnosis not present

## 2015-11-26 LAB — CBC WITH DIFFERENTIAL/PLATELET
BASOS PCT: 0 %
Basophils Absolute: 0.1 10*3/uL (ref 0.0–0.1)
EOS ABS: 0.1 10*3/uL (ref 0.0–0.7)
Eosinophils Relative: 1 %
HCT: 23.4 % — ABNORMAL LOW (ref 36.0–46.0)
HEMOGLOBIN: 8 g/dL — AB (ref 12.0–15.0)
LYMPHS PCT: 22 %
Lymphs Abs: 2.5 10*3/uL (ref 0.7–4.0)
MCH: 34.8 pg — ABNORMAL HIGH (ref 26.0–34.0)
MCHC: 34.2 g/dL (ref 30.0–36.0)
MCV: 101.7 fL — ABNORMAL HIGH (ref 78.0–100.0)
MONOS PCT: 11 %
Monocytes Absolute: 1.2 10*3/uL — ABNORMAL HIGH (ref 0.1–1.0)
NEUTROS ABS: 7.5 10*3/uL (ref 1.7–7.7)
NEUTROS PCT: 66 %
PLATELETS: 361 10*3/uL (ref 150–400)
RBC: 2.3 MIL/uL — ABNORMAL LOW (ref 3.87–5.11)
RDW: 19.5 % — ABNORMAL HIGH (ref 11.5–15.5)
WBC: 11.4 10*3/uL — ABNORMAL HIGH (ref 4.0–10.5)

## 2015-11-26 LAB — RETICULOCYTES: RBC.: 2.3 MIL/uL — AB (ref 3.87–5.11)

## 2015-11-26 MED ORDER — HEPARIN SOD (PORK) LOCK FLUSH 100 UNIT/ML IV SOLN
250.0000 [IU] | INTRAVENOUS | Status: AC | PRN
  Administered 2015-11-26: 250 [IU]
  Filled 2015-11-26: qty 5

## 2015-11-26 MED ORDER — DIPHENHYDRAMINE HCL 50 MG/ML IJ SOLN
25.0000 mg | INTRAMUSCULAR | Status: DC | PRN
  Administered 2015-11-26: 25 mg via INTRAVENOUS
  Filled 2015-11-26 (×3): qty 0.5

## 2015-11-26 MED ORDER — NALOXONE HCL 0.4 MG/ML IJ SOLN: 0.4000 mg | INTRAMUSCULAR | Status: DC | PRN

## 2015-11-26 MED ORDER — DEXTROSE-NACL 5-0.45 % IV SOLN
INTRAVENOUS | Status: DC
  Administered 2015-11-26: 12:00:00 via INTRAVENOUS

## 2015-11-26 MED ORDER — SODIUM CHLORIDE 0.9% FLUSH: 9.0000 mL | INTRAVENOUS | Status: DC | PRN

## 2015-11-26 MED ORDER — KETOROLAC TROMETHAMINE 30 MG/ML IJ SOLN
30.0000 mg | Freq: Four times a day (QID) | INTRAMUSCULAR | Status: DC
  Administered 2015-11-26: 30 mg via INTRAVENOUS
  Filled 2015-11-26: qty 1

## 2015-11-26 MED ORDER — ONDANSETRON HCL 4 MG/2ML IJ SOLN
4.0000 mg | Freq: Four times a day (QID) | INTRAMUSCULAR | Status: DC | PRN
Start: 2015-11-26 — End: 2015-11-26

## 2015-11-26 MED ORDER — DIPHENHYDRAMINE HCL 25 MG PO CAPS
25.0000 mg | ORAL_CAPSULE | ORAL | Status: DC | PRN
  Administered 2015-11-26: 25 mg via ORAL
  Filled 2015-11-26: qty 1

## 2015-11-26 MED ORDER — SENNOSIDES-DOCUSATE SODIUM 8.6-50 MG PO TABS: 1.0000 | ORAL_TABLET | Freq: Two times a day (BID) | ORAL | Status: DC

## 2015-11-26 MED ORDER — SODIUM CHLORIDE 0.9% FLUSH
10.0000 mL | INTRAVENOUS | Status: AC | PRN
Start: 2015-11-26 — End: 2015-11-26
  Administered 2015-11-26: 10 mL

## 2015-11-26 MED ORDER — POLYETHYLENE GLYCOL 3350 17 G PO PACK: 17.0000 g | PACK | Freq: Every day | ORAL | Status: DC | PRN

## 2015-11-26 MED ORDER — HYDROMORPHONE HCL 2 MG/ML IJ SOLN
1.0000 mg | INTRAMUSCULAR | Status: AC
  Administered 2015-11-26: 1 mg via INTRAVENOUS
  Filled 2015-11-26: qty 1

## 2015-11-26 MED ORDER — HYDROMORPHONE 1 MG/ML IV SOLN
INTRAVENOUS | Status: DC
  Administered 2015-11-26: 12:00:00 via INTRAVENOUS
  Administered 2015-11-26: 12 mg via INTRAVENOUS
  Filled 2015-11-26: qty 25

## 2015-11-26 NOTE — Telephone Encounter (Signed)
Called patient after speaking with provider, Dr. Hyman HopesJegede to let patient know [facility]. Pt voiced understanding and stated she could be her by 10:30am.

## 2015-11-26 NOTE — Telephone Encounter (Signed)
Pt called, requesting to come to [facility] for treatment. Pt states pain is 8/10. She states her pain is in her back, arms and legs. She took her last pain med of Oxy and MS contin at 6 am. Will check with the provider and give her a call back. Pt voiced understanding.

## 2015-11-26 NOTE — Discharge Instructions (Signed)
Sickle Cell Anemia, Adult Sickle cell anemia is a condition in which red blood cells have an abnormal "sickle" shape. This abnormal shape shortens the cells' life span, which results in a lower than normal concentration of red blood cells in the blood. The sickle shape also causes the cells to clump together and block free blood flow through the blood vessels. As a result, the tissues and organs of the body do not receive enough oxygen. Sickle cell anemia causes organ damage and pain and increases the risk of infection. CAUSES  Sickle cell anemia is a genetic disorder. Those who receive two copies of the gene have the condition, and those who receive one copy have the trait. RISK FACTORS The sickle cell gene is most common in people whose families originated in [place]. Other areas of the globe where sickle cell trait occurs include the Mediterranean, [place] and [place], the [place], and the [place].  SIGNS AND SYMPTOMS  Pain, especially in the extremities, back, chest, or abdomen (common). The pain may start suddenly or may develop following an illness, especially if there is dehydration. Pain can also occur due to overexertion or exposure to extreme temperature changes.  Frequent severe bacterial infections, especially certain types of pneumonia and meningitis.  Pain and swelling in the hands and feet.  Decreased activity.   Loss of appetite.   Change in behavior.  Headaches.  Seizures.  Shortness of breath or difficulty breathing.  Vision changes.  Skin ulcers. Those with the trait may not have symptoms or they may have mild symptoms.  DIAGNOSIS  Sickle cell anemia is diagnosed with blood tests that demonstrate the genetic trait. It is often diagnosed during the newborn period, due to mandatory testing nationwide. A variety of blood tests, X-rays, CT scans, MRI scans, ultrasounds, and lung function tests may also be done to monitor the condition. TREATMENT  Sickle  cell anemia may be treated with:  Medicines. You may be given pain medicines, antibiotic medicines (to treat and prevent infections) or medicines to increase the production of certain types of hemoglobin.  Fluids.  Oxygen.  Blood transfusions. HOME CARE INSTRUCTIONS   Drink enough fluid to keep your urine clear or pale yellow. Increase your fluid intake in hot weather and during exercise.  Do not smoke. Smoking lowers oxygen levels in the blood.   Only take over-the-counter or prescription medicines for pain, fever, or discomfort as directed by your health care provider.  Take antibiotics as directed by your health care provider. Make sure you finish them it even if you start to feel better.   Take supplements as directed by your health care provider.   Consider wearing a medical alert bracelet. This tells anyone caring for you in an emergency of your condition.   When traveling, keep your medical information, health care provider's names, and the medicines you take with you at all times.   If you develop a fever, do not take medicines to reduce the fever right away. This could cover up a problem that is developing. Notify your health care provider.  Keep all follow-up appointments with your health care provider. Sickle cell anemia requires regular medical care. SEEK MEDICAL CARE IF: You have a fever. SEEK IMMEDIATE MEDICAL CARE IF:   You feel dizzy or faint.   You have new abdominal pain, especially on the left side near the stomach area.   You develop a persistent, often uncomfortable and painful penile erection (priapism). If this is not treated immediately it   will lead to impotence.   You have numbness your arms or legs or you have a hard time moving them.   You have a hard time with speech.   You have a fever or persistent symptoms for more than 2-3 days.   You have a fever and your symptoms suddenly get worse.   You have signs or symptoms of infection.  These include:   Chills.   Abnormal tiredness (lethargy).   Irritability.   Poor eating.   Vomiting.   You develop pain that is not helped with medicine.   You develop shortness of breath.  You have pain in your chest.   You are coughing up pus-like or bloody sputum.   You develop a stiff neck.  Your feet or hands swell or have pain.  Your abdomen appears bloated.  You develop joint pain. MAKE SURE YOU:  Understand these instructions.   This information is not intended to replace advice given to you by your health care provider. Make sure you discuss any questions you have with your health care provider.   Document Released: 05/27/2005 Document Revised: 03/09/2014 Document Reviewed: 09/28/2012 Elsevier Interactive Patient Education 2016 [employer]  

## 2015-11-26 NOTE — H&P (Signed)
[facility] History and Physical  Brittany Foster ZOX:[id number] DOB: [birth year] DOA: 11/26/2015  PCP: Jeanann Lewandowsky, MD   Chief Complaint: Pain  HPI: Brittany Foster is a 30 y.o. female with history of sickle cell disease who presented to the day hospital today with complaint of pain in her back, hip joints and legs consistent with her sickle cell pain crisis. She was just discharged from the hospital for the same complaint. She took oxycodone 10 mg and MS contin this morning without relief.Patient is not adherent with her home medications including hydroxyurea, folic acid and even pain medications. She rated her pain at 10 out of 10 today, she denies any fever, no urinary symptoms, no headache, no shortness of breath, no chest pain, no nausea, vomiting or diarrhea. No joint swelling or redness.  Systemic Review: General: The patient denies anorexia, fever, weight loss Cardiac: Denies chest pain, syncope, palpitations, pedal edema  Respiratory: Denies cough, shortness of breath, wheezing GI: Denies severe indigestion/heartburn, abdominal pain, nausea, vomiting, diarrhea and constipation GU: Denies hematuria, incontinence, dysuria  Musculoskeletal: Denies arthritis  Skin: Denies suspicious skin lesions Neurologic: Denies focal weakness or numbness, change in vision  Past Medical History:  Diagnosis Date  . Anemia   . Depression, major, recurrent (HCC)   . Migraines   . Sickle cell anemia (HCC)     Past Surgical History:  Procedure Laterality Date  . CESAREAN SECTION    . CHOLECYSTECTOMY  2000  . IR GENERIC HISTORICAL  10/08/2015   IR US GUIDE VASC ACCESS RIGHT 10/08/2015 Simonne Come, MD [facility]  . IR GENERIC HISTORICAL  10/08/2015   IR FLUORO GUIDE CV LINE RIGHT 10/08/2015 Simonne Come, MD [facility]  . MULTIPLE TOOTH EXTRACTIONS N/A   . port a cath placement Right    about 6-7 years ago  . removal of porta cath Right 09/11/15  . TUBAL LIGATION      Allergies   Allergen Reactions  . Ultram [Tramadol] Other (See Comments)    seizures  . Zofran [Ondansetron Hcl] Nausea And Vomiting  . Buprenorphine Hcl Hives and Rash    Shaking Tolerates Percocet, Norco, and buprenorphine  . Morphine And Related Hives, Rash and Other (See Comments)    Shaking Tolerates Percocet, Norco, Dilaudid, and buprenorphine  . Tape Rash    Family History  Problem Relation Age of Onset  . Sickle cell trait Father   . Sickle cell trait Mother   . Sickle cell anemia Other       Prior to Admission medications   Medication Sig Start Date End Date Taking? Authorizing Provider  DULoxetine (CYMBALTA) 60 MG capsule Take 1 capsule (60 mg total) by mouth daily. 08/08/15   [name], FNP  folic acid (FOLVITE) 1 MG tablet Take 1 tablet (1 mg total) by mouth daily. 08/08/15   [name], FNP  hydroxyurea (HYDREA) 500 MG capsule TAKE 2 CAPSULES BY MOUTH DAILY. MAY TAKE WITH FOOD TO MINIMIZE GI SIDE EFFECTS. Patient taking differently: Take 500 mg by mouth daily. MAY TAKE WITH FOOD TO MINIMIZE GI SIDE EFFECTS. 08/08/15   [name], FNP  ibuprofen (ADVIL,MOTRIN) 200 MG tablet Take 400 mg by mouth every 6 (six) hours as needed for moderate pain.     Historical Provider, MD  morphine (MS CONTIN) 30 MG 12 hr tablet Take 1 tablet (30 mg total) by mouth every 12 (twelve) hours. 10/25/15   Henrietta Hoover, NP  oxyCODONE-acetaminophen (PERCOCET) 10-325 MG tablet  Take 1 tablet by mouth every 4 (four) hours as needed for pain. 11/01/15   Quentin Angstlugbemiga E Cicley Ganesh, MD  potassium chloride SA (K-DUR,KLOR-CON) 20 MEQ tablet Take 2 tablets (40 mEq total) by mouth daily. 11/23/15   Altha HarmMichelle A [name], MD  Topiramate ER 100 MG CP24 Take 100 mg by mouth at bedtime.    Historical Provider, MD     Physical Exam: There were no vitals filed for this visit.  General: Alert, awake, afebrile, anicteric, not in obvious distress HEENT: Normocephalic and Atraumatic, Mucous membranes pink                 PERRLA; EOM intact; No scleral icterus,                 Nares: Patent, Oropharynx: Clear, Fair Dentition                 Neck: FROM, no cervical lymphadenopathy, thyromegaly, carotid bruit or JVD;  CHEST WALL: No tenderness  CHEST: Normal respiration, clear to auscultation bilaterally  HEART: Regular rate and rhythm; no murmurs rubs or gallops  BACK: No kyphosis or scoliosis; no CVA tenderness  ABDOMEN: Positive Bowel Sounds, soft, non-tender; no masses, no organomegaly EXTREMITIES: No cyanosis, clubbing, or edema SKIN:  no rash or ulceration  CNS: Alert and Oriented x 4, Nonfocal exam, CN 2-12 intact  Labs on Admission:  Basic Metabolic Panel:  Recent Labs Lab 11/21/15 2017 11/22/15 0615 11/23/15 1154  NA 140 141 138  K 2.8* 3.1* 4.5  CL 110 112* 114*  CO2 22 23 21*  GLUCOSE 120* 138* 91  BUN <5* 6 6  CREATININE 0.45 0.39* 0.34*  CALCIUM 8.8* 8.4* 8.5*  MG  --  1.8  --    Liver Function Tests:  Recent Labs Lab 11/21/15 2017 11/22/15 0615  AST 47* 41  ALT 30 28  ALKPHOS 77 65  BILITOT 2.5* 2.1*  PROT 7.6 6.4*  ALBUMIN 4.2 3.6   No results for input(s): LIPASE, AMYLASE in the last 168 hours. No results for input(s): AMMONIA in the last 168 hours. CBC:  Recent Labs Lab 11/20/15 1004 11/21/15 2017 11/22/15 0615  WBC 13.7* 15.0* 14.4*  NEUTROABS 8.0* 9.2* 7.6  HGB 7.9* 8.3* 7.1*  HCT 22.5* 23.3* 20.3*  MCV 100.0 98.7 99.0  PLT 349 357 288   Cardiac Enzymes: No results for input(s): CKTOTAL, CKMB, CKMBINDEX, TROPONINI in the last 168 hours.  BNP (last 3 results) No results for input(s): BNP in the last 8760 hours.  ProBNP (last 3 results) No results for input(s): PROBNP in the last 8760 hours.  CBG: No results for input(s): GLUCAP in the last 168 hours.   Assessment/Plan Active Problems:   Sickle cell anemia with pain (HCC)   Admits to the Day Hospital  IVF D5 .45% Saline @ 125 mls/hour  Weight based Dilaudid PCA started within 30  minutes of admission  IV Toradol 30 mg Q 6 H  Monitor vitals very closely, Re-evaluate pain scale every hour  2 L of Oxygen by [place]  Patient will be re-evaluated for pain in the context of function and relationship to baseline as care progresses.  If no significant relieve from pain (remains above 5/10) will transfer patient to inpatient services for further evaluation and management  Code Status: Full  Family Communication: None  DVT Prophylaxis: Ambulate as tolerated   Time spent: 35 Minutes  Eupha Lobb, MD, MHA, FACP, FAAP, CPE  If 7PM-7AM, please contact night-coverage [email or web address] 11/26/2015,  11:26 AM

## 2015-11-26 NOTE — Discharge Summary (Signed)
Physician Discharge Summary  Brittany Foster ZOX:[id number] DOB: 1985-12-14 DOA: 11/26/2015  PCP: Jeanann Lewandowsky, MD  Admit date: 11/26/2015  Discharge date: 11/26/2015  Time spent: 30 minutes  Discharge Diagnoses:  Active Problems:   Sickle cell anemia with pain [facility])   Discharge Condition: Stable  Diet recommendation: Regular  Filed Weights   11/26/15 1128  Weight: 125 lb (56.7 kg)    History of present illness:  Brittany Foster is a 30 y.o. female with history of sickle cell disease whopresented to the day hospital today with complaint of pain in her back, hip joints and legs consistent with her sickle cell pain crisis. She was just discharged from the hospital for the same complaint. She took oxycodone 10 mg and MS contin this morning without relief.Patient is not adherent with her home medications including hydroxyurea, folic acid and even pain medications. She rated her pain at 10 out of 10 today, she denies any fever, no urinary symptoms, no headache, no shortness of breath, no chest pain, no nausea, vomiting or diarrhea. No joint swelling or redness.  Hospital Course:  Brittany Foster was admitted to the day hospital with sickle cell painful crisis. Patient was treated with weight based IV Dilaudid PCA, IV Toradol as well as IV fluids. Hema showed significant improvement symptomatically, pain improved from 10 to 5/10 at the time of discharge. Patient was discharged home is a hemodynamically stable condition. Timberlynn will follow-up at the clinic as previously scheduled, continue with home medications as per prior to admission.  Discharge Instructions We discussed the need for good hydration, monitoring of hydration status, avoidance of heat, cold, stress, and infection triggers. We discussed the need to be compliant with taking Hydrea. Mica was reminded of the need to seek medical attention of any symptoms of bleeding, anemia, or infection occurs.  Discharge Exam: Vitals:    11/26/15 1424 11/26/15 1520  BP: 96/62 101/67  Pulse: 92 (!) 105  Resp: 10 11  Temp:      General appearance: alert, cooperative and no distress Eyes: conjunctivae/corneas clear. PERRL, EOM's intact. Fundi benign. Neck: no adenopathy, no carotid bruit, no JVD, supple, symmetrical, trachea midline and thyroid not enlarged, symmetric, no tenderness/mass/nodules Back: symmetric, no curvature. ROM normal. No CVA tenderness. Resp: clear to auscultation bilaterally Chest wall: no tenderness Cardio: regular rate and rhythm, S1, S2 normal, no murmur, click, rub or gallop GI: soft, non-tender; bowel sounds normal; no masses, no organomegaly Extremities: extremities normal, atraumatic, no cyanosis or edema Pulses: 2+ and symmetric Skin: Skin color, texture, turgor normal. No rashes or lesions Neurologic: Grossly normal   Current Discharge Medication List    CONTINUE these medications which have NOT CHANGED   Details  DULoxetine (CYMBALTA) 60 MG capsule Take 1 capsule (60 mg total) by mouth daily. Qty: 30 capsule, Refills: 3   Associated Diagnoses: Chronic pain syndrome    folic acid (FOLVITE) 1 MG tablet Take 1 tablet (1 mg total) by mouth daily. 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 MAY TAKE WITH FOOD TO MINIMIZE GI SIDE EFFECTS. Qty: 60 capsule, Refills: 3   Associated Diagnoses: Hb-SS disease without crisis (HCC)    ibuprofen (ADVIL,MOTRIN) 200 MG tablet Take 400 mg by mouth every 6 (six) hours as needed for moderate pain.     morphine (MS CONTIN) 30 MG 12 hr tablet Take 1 tablet (30 mg total) by mouth  every 12 (twelve) hours. Qty: 30 tablet, Refills: 0   Associated Diagnoses: Hb-SS disease without crisis (HCC)    oxyCODONE-acetaminophen (PERCOCET) 10-325 MG tablet Take 1 tablet by mouth every 4 (four) hours as needed for pain. Qty: 60 tablet, Refills: 0     Topiramate ER 100 MG CP24 Take 100 mg by mouth at bedtime.    potassium chloride SA (K-DUR,KLOR-CON) 20 MEQ tablet Take 2 tablets (40 mEq total) by mouth daily. Qty: 30 tablet, Refills: 1       Allergies  Allergen Reactions  . Ultram [Tramadol] Other (See Comments)    seizures  . Zofran [Ondansetron Hcl] Nausea And Vomiting  . Buprenorphine Hcl Hives and Rash    Shaking Tolerates Percocet, Norco, and buprenorphine  . Morphine And Related Hives, Rash and Other (See Comments)    Shaking Tolerates Percocet, Norco, Dilaudid, and buprenorphine  . Tape Rash     Significant Diagnostic Studies: Dg Chest 2 View  Result Date: 11/21/2015 CLINICAL DATA:  Generalized chest pain for 2 days. History of sickle cell. EXAM: CHEST  2 VIEW COMPARISON:  11/04/2015 FINDINGS: Right central venous catheter with tip over the cavoatrial junction. No pneumothorax. Normal heart size and pulmonary vascularity. No focal airspace disease or consolidation in the lungs. No blunting of costophrenic angles. No pneumothorax. Mediastinal contours appear intact. IMPRESSION: No active cardiopulmonary disease. Electronically Signed   By: Burman NievesWilliam  Stevens M.D.   On: 11/21/2015 22:59   Dg Chest 2 View  Result Date: 11/04/2015 CLINICAL DATA:  30 y/o female with sickle cell crisis onset today, c/o LEFT CP and leg pain. Hx heart murmur, MI, PE, former smoker. EXAM: CHEST  2 VIEW COMPARISON:  10/19/2015 FINDINGS: Right central line tip overlies the level of superior vena cava. Heart size is normal. Lungs are clear. Visualized osseous structures have a normal appearance. Surgical clips are noted in the right upper quadrant the abdomen. IMPRESSION: No active cardiopulmonary disease. Electronically Signed   By: Norva PavlovElizabeth  Brown M.D.   On: 11/04/2015 18:10    Signed:  Jeanann LewandowskyJEGEDE, Gari Hartsell MD, MHA, FACP, FAAP, CPE   11/26/2015, 4:43 PM

## 2015-11-26 NOTE — Progress Notes (Signed)
[facility]. Pt was treated with IV fluids, Dilaudid PCA, Toradol and a one ;time IV push Dilaudid. Pt's pain # remain the same even at discharge. Pt reminded to take her home meds as prescribe. Discharge instructions given to patient with verbal understanding. Pt was alert, oriented and ambulatory at discharge.

## 2015-11-28 ENCOUNTER — Other Ambulatory Visit: Payer: Self-pay | Admitting: Internal Medicine

## 2015-11-28 DIAGNOSIS — D571 Sickle-cell disease without crisis: Secondary | ICD-10-CM

## 2015-11-28 MED ORDER — OXYCODONE-ACETAMINOPHEN 10-325 MG PO TABS: 1.0000 | ORAL_TABLET | ORAL | 0 refills | Status: DC | PRN

## 2015-11-28 MED ORDER — MORPHINE SULFATE ER 30 MG PO TBCR: 30.0000 mg | EXTENDED_RELEASE_TABLET | Freq: Two times a day (BID) | ORAL | 0 refills | Status: DC

## 2015-11-29 ENCOUNTER — Telehealth (HOSPITAL_COMMUNITY): Payer: Self-pay | Admitting: Hematology

## 2015-11-29 NOTE — Telephone Encounter (Signed)
Patient C/O pain to arms/back/hips that is 8/10 on pain scale.  Patient denies N/V/D, chest pain or shortness of breath, or abdominal pain.  Patient states she took MS Contin at 4:00a.m. And she took it at 1600 yesterday.  Took oxycodone yesterday around 22:00.  Patient states she is out of oxycodone so she hasn't taken any today.  I advised I would notify the physician and give her a call back.  Patient verbalizes understanding.

## 2015-11-29 NOTE — Telephone Encounter (Signed)
Discussed with provider and patient can come and pick up prescription.  I advised patient prescription is ready for pick up.  She verbalizes understanding.

## 2015-12-03 ENCOUNTER — Telehealth (HOSPITAL_COMMUNITY): Payer: Self-pay | Admitting: *Deleted

## 2015-12-03 ENCOUNTER — Encounter (HOSPITAL_COMMUNITY): Payer: Self-pay | Admitting: *Deleted

## 2015-12-03 ENCOUNTER — Non-Acute Institutional Stay (HOSPITAL_COMMUNITY)
Admission: AD | Admit: 2015-12-03 | Discharge: 2015-12-03 | Disposition: A | Discharge status: Home / Self Care | Payer: Medicaid Other | Source: Ambulatory Visit | Attending: Internal Medicine | Admitting: Internal Medicine

## 2015-12-03 DIAGNOSIS — Z79899 Other long term (current) drug therapy: Secondary | ICD-10-CM | POA: Diagnosis not present

## 2015-12-03 DIAGNOSIS — D5700 Hb-SS disease with crisis, unspecified: Secondary | ICD-10-CM | POA: Insufficient documentation

## 2015-12-03 DIAGNOSIS — Z9114 Patient's other noncompliance with medication regimen: Secondary | ICD-10-CM | POA: Insufficient documentation

## 2015-12-03 DIAGNOSIS — F329 Major depressive disorder, single episode, unspecified: Secondary | ICD-10-CM | POA: Insufficient documentation

## 2015-12-03 LAB — CBC WITH DIFFERENTIAL/PLATELET
BASOS ABS: 0.1 10*3/uL (ref 0.0–0.1)
Basophils Relative: 1 %
EOS ABS: 0.1 10*3/uL (ref 0.0–0.7)
EOS PCT: 1 %
HCT: 24.5 % — ABNORMAL LOW (ref 36.0–46.0)
Hemoglobin: 8.8 g/dL — ABNORMAL LOW (ref 12.0–15.0)
LYMPHS ABS: 3.1 10*3/uL (ref 0.7–4.0)
Lymphocytes Relative: 35 %
MCH: 34.9 pg — ABNORMAL HIGH (ref 26.0–34.0)
MCHC: 35.9 g/dL (ref 30.0–36.0)
MCV: 97.2 fL (ref 78.0–100.0)
MONO ABS: 1.2 10*3/uL — AB (ref 0.1–1.0)
Monocytes Relative: 14 %
NEUTROS ABS: 4.3 10*3/uL (ref 1.7–7.7)
NEUTROS PCT: 49 %
PLATELETS: 288 10*3/uL (ref 150–400)
RBC: 2.52 MIL/uL — AB (ref 3.87–5.11)
RDW: 18.6 % — AB (ref 11.5–15.5)
WBC: 8.8 10*3/uL (ref 4.0–10.5)

## 2015-12-03 LAB — RETICULOCYTES
RBC.: 2.52 MIL/uL — AB (ref 3.87–5.11)
RETIC COUNT ABSOLUTE: 448.6 10*3/uL — AB (ref 19.0–186.0)
RETIC CT PCT: 17.8 % — AB (ref 0.4–3.1)

## 2015-12-03 MED ORDER — NALOXONE HCL 0.4 MG/ML IJ SOLN: 0.4000 mg | INTRAMUSCULAR | Status: DC | PRN

## 2015-12-03 MED ORDER — SODIUM CHLORIDE 0.9% FLUSH: 9.0000 mL | INTRAVENOUS | Status: DC | PRN

## 2015-12-03 MED ORDER — ONDANSETRON HCL 4 MG/2ML IJ SOLN: 4.0000 mg | Freq: Four times a day (QID) | INTRAMUSCULAR | Status: DC | PRN

## 2015-12-03 MED ORDER — KETOROLAC TROMETHAMINE 30 MG/ML IJ SOLN
30.0000 mg | Freq: Four times a day (QID) | INTRAMUSCULAR | Status: DC
  Administered 2015-12-03: 30 mg via INTRAVENOUS
  Filled 2015-12-03: qty 1

## 2015-12-03 MED ORDER — SODIUM CHLORIDE 0.9% FLUSH
10.0000 mL | INTRAVENOUS | Status: AC | PRN
  Administered 2015-12-03: 10 mL

## 2015-12-03 MED ORDER — DEXTROSE-NACL 5-0.45 % IV SOLN
INTRAVENOUS | Status: DC
  Administered 2015-12-03: 11:00:00 via INTRAVENOUS

## 2015-12-03 MED ORDER — SENNOSIDES-DOCUSATE SODIUM 8.6-50 MG PO TABS: 1.0000 | ORAL_TABLET | Freq: Two times a day (BID) | ORAL | Status: DC

## 2015-12-03 MED ORDER — HEPARIN SOD (PORK) LOCK FLUSH 100 UNIT/ML IV SOLN
500.0000 [IU] | INTRAVENOUS | Status: AC | PRN
  Administered 2015-12-03: 500 [IU]
  Filled 2015-12-03: qty 5

## 2015-12-03 MED ORDER — HYDROMORPHONE HCL 2 MG/ML IJ SOLN
2.0000 mg | Freq: Once | INTRAMUSCULAR | Status: AC
  Administered 2015-12-03: 2 mg via INTRAVENOUS
  Filled 2015-12-03: qty 1

## 2015-12-03 MED ORDER — SODIUM CHLORIDE 0.9 % IV SOLN
25.0000 mg | INTRAVENOUS | Status: DC | PRN
  Administered 2015-12-03: 25 mg via INTRAVENOUS
  Filled 2015-12-03 (×3): qty 0.5

## 2015-12-03 MED ORDER — POLYETHYLENE GLYCOL 3350 17 G PO PACK: 17.0000 g | PACK | Freq: Every day | ORAL | Status: DC | PRN

## 2015-12-03 MED ORDER — DIPHENHYDRAMINE HCL 25 MG PO CAPS
25.0000 mg | ORAL_CAPSULE | ORAL | Status: DC | PRN
  Administered 2015-12-03: 25 mg via ORAL
  Filled 2015-12-03: qty 1

## 2015-12-03 MED ORDER — HYDROMORPHONE 1 MG/ML IV SOLN
INTRAVENOUS | Status: DC
  Administered 2015-12-03: 11:00:00 via INTRAVENOUS
  Administered 2015-12-03: 7.5 mg via INTRAVENOUS
  Administered 2015-12-03: 9.5 mg via INTRAVENOUS
  Filled 2015-12-03: qty 25

## 2015-12-03 NOTE — Telephone Encounter (Signed)
Called complaining of pain in ribs, hips and some abdominal pain. Denies chest pain, fever or diarrhea. Pain intensity 8/10. Took oxycodone 10 mg at 6 am and MS Contin at 5am without relief. Advised pt. That I will notify the provider.

## 2015-12-03 NOTE — H&P (Signed)
[facility] History and Physical  Brittany Foster Lantzy ZOX:[id number] DOB: 01/30/1986 DOA: 12/03/2015  PCP: Jeanann LewandowskyJEGEDE, Ramiah Helfrich, MD   Chief Complaint: Sickle Cell Pain  HPI: Brittany Foster Hausmann is a 30 y.o. female with history of sickle cell disease whopresented to the day hospital today with complaint of pain in her back, hip joints and legs consistent with her sickle cell pain crisis. She took oxycodone 10 mg and MS contin this morning without relief.Patient is not adherent with her home medications including hydroxyurea, folic acid and even pain medications. She appeared depressed, saying "I'm tired", "I don't sleep enough", "I know what to do but I am not motivated to do them". She however denies any suicidal thoughts or ideation. She rated her pain at 10 out of 10 today, she denies any fever, no urinary symptoms, no headache, no shortness of breath, no chest pain, no nausea, vomiting or diarrhea. No joint swelling or redness.  Systemic Review: General: The patient denies anorexia, fever, weight loss Cardiac: Denies chest pain, syncope, palpitations, pedal edema  Respiratory: Denies cough, shortness of breath, wheezing GI: Denies severe indigestion/heartburn, abdominal pain, nausea, vomiting, diarrhea and constipation GU: Denies hematuria, incontinence, dysuria  Musculoskeletal: Denies arthritis  Skin: Denies suspicious skin lesions Neurologic: Denies focal weakness or numbness, change in vision  Past Medical History:  Diagnosis Date  . Anemia   . Depression, major, recurrent (HCC)   . Migraines   . Sickle cell anemia (HCC)     Past Surgical History:  Procedure Laterality Date  . CESAREAN SECTION    . CHOLECYSTECTOMY  2000  . IR GENERIC HISTORICAL  10/08/2015   IR US GUIDE VASC ACCESS RIGHT 10/08/2015 Simonne ComeJohn Watts, MD [facility]  . IR GENERIC HISTORICAL  10/08/2015   IR FLUORO GUIDE CV LINE RIGHT 10/08/2015 Simonne ComeJohn Watts, MD [facility]  . MULTIPLE TOOTH EXTRACTIONS N/A   . port a  cath placement Right    about 6-7 years ago  . removal of porta cath Right 09/11/15  . TUBAL LIGATION      Allergies  Allergen Reactions  . Ultram [Tramadol] Other (See Comments)    seizures  . Zofran [Ondansetron Hcl] Nausea And Vomiting  . Buprenorphine Hcl Hives and Rash    Shaking Tolerates Percocet, Norco, and buprenorphine  . Morphine And Related Hives, Rash and Other (See Comments)    Shaking Tolerates Percocet, Norco, Dilaudid, and buprenorphine  . Tape Rash    Family History  Problem Relation Age of Onset  . Sickle cell trait Father   . Sickle cell trait Mother   . Sickle cell anemia Other       Prior to Admission medications   Medication Sig Start Date End Date Taking? Authorizing Provider  DULoxetine (CYMBALTA) 60 MG capsule TAKE 1 CAPSULE BY MOUTH DAILY. 11/27/15   Quentin Angstlugbemiga E Martavia Tye, MD  folic acid (FOLVITE) 1 MG tablet Take 1 tablet (1 mg total) by mouth daily. 08/08/15   Massie MaroonLachina M [name], FNP  gabapentin (NEURONTIN) 300 MG capsule TAKE 1 CAPSULE BY MOUTH 3 TIMES DAILY. 11/27/15   Quentin Angstlugbemiga E Brittanie Dosanjh, MD  hydroxyurea (HYDREA) 500 MG capsule TAKE 2 CAPSULES BY MOUTH DAILY. MAY TAKE WITH FOOD TO MINIMIZE GI SIDE EFFECTS. Patient taking differently: Take 500 mg by mouth daily. MAY TAKE WITH FOOD TO MINIMIZE GI SIDE EFFECTS. 08/08/15   Massie MaroonLachina M [name], FNP  ibuprofen (ADVIL,MOTRIN) 200 MG tablet Take 400 mg by mouth every 6 (six) hours as needed for moderate pain.  Historical Provider, MD  morphine (MS CONTIN) 30 MG 12 hr tablet Take 1 tablet (30 mg total) by mouth every 12 (twelve) hours. 11/28/15   Quentin Angst, MD  oxyCODONE-acetaminophen (PERCOCET) 10-325 MG tablet Take 1 tablet by mouth every 4 (four) hours as needed for pain. 11/28/15   Quentin Angst, MD  potassium chloride SA (K-DUR,KLOR-CON) 20 MEQ tablet Take 2 tablets (40 mEq total) by mouth daily. 11/23/15   Altha Harm, MD  Topiramate ER 100 MG CP24 Take 100 mg by mouth at bedtime.     Historical Provider, MD     Physical Exam: There were no vitals filed for this visit.  General: Alert, awake, afebrile, anicteric, not in obvious distress HEENT: Normocephalic and Atraumatic, Mucous membranes pink                PERRLA; EOM intact; No scleral icterus,                 Nares: Patent, Oropharynx: Clear, Fair Dentition                 Neck: FROM, no cervical lymphadenopathy, thyromegaly, carotid bruit or JVD;  CHEST WALL: No tenderness  CHEST: Normal respiration, clear to auscultation bilaterally  HEART: Regular rate and rhythm; no murmurs rubs or gallops  BACK: No kyphosis or scoliosis; no CVA tenderness  ABDOMEN: Positive Bowel Sounds, soft, non-tender; no masses, no organomegaly EXTREMITIES: No cyanosis, clubbing, or edema SKIN:  no rash or ulceration  CNS: Alert and Oriented x 4, Nonfocal exam, CN 2-12 intact  Labs on Admission:  Basic Metabolic Panel: No results for input(s): NA, K, CL, CO2, GLUCOSE, BUN, CREATININE, CALCIUM, MG, PHOS in the last 168 hours. Liver Function Tests: No results for input(s): AST, ALT, ALKPHOS, BILITOT, PROT, ALBUMIN in the last 168 hours. No results for input(s): LIPASE, AMYLASE in the last 168 hours. No results for input(s): AMMONIA in the last 168 hours. CBC:  Recent Labs Lab 11/26/15 1126  WBC 11.4*  NEUTROABS 7.5  HGB 8.0*  HCT 23.4*  MCV 101.7*  PLT 361   Cardiac Enzymes: No results for input(s): CKTOTAL, CKMB, CKMBINDEX, TROPONINI in the last 168 hours.  BNP (last 3 results) No results for input(s): BNP in the last 8760 hours.  ProBNP (last 3 results) No results for input(s): PROBNP in the last 8760 hours.  CBG: No results for input(s): GLUCAP in the last 168 hours.   Assessment/Plan Active Problems:   Sickle cell anemia with pain (HCC)   Admits to the Day Hospital  IVF D5 .45% Saline @ 125 mls/hour  Weight based Dilaudid PCA started within 30 minutes of admission  IV Toradol 30 mg Q 6 H  Monitor  vitals very closely, Re-evaluate pain scale every hour  2 L of Oxygen by [place]  Patient will be re-evaluated for pain in the context of function and relationship to baseline as care progresses.  If no significant relieve from pain (remains above 5/10) will transfer patient to inpatient services for further evaluation and management  Code Status: Full  Family Communication: None  DVT Prophylaxis: Ambulate as tolerated   Time spent: 35 Minutes  Uliana Brinker, MD, MHA, FACP, FAAP, CPE  If 7PM-7AM, please contact night-coverage [email or web address] 12/03/2015, 10:29 AM

## 2015-12-03 NOTE — Discharge Summary (Signed)
Physician Discharge Summary  Brittany Foster ZOX:[id number] DOB: [birth year] DOA: 12/03/2015  PCP: Brittany Lewandowsky, MD  Admit date: 12/03/2015  Discharge date: 12/03/2015  Time spent: 30 minutes  Discharge Diagnoses:  Active Problems:   Sickle cell anemia with pain [facility])   Discharge Condition: Stable  Diet recommendation: Regular  Filed Weights   12/03/15 1028  Weight: 125 lb (56.7 kg)    History of present illness:  Brittany Foster is a 30 y.o. female with history of sickle cell disease whopresented to the day hospital today with complaint of pain in her back, hip joints and legs consistent with her sickle cell pain crisis. She took oxycodone 10 mg and MS contin this morning without relief.Patient is not adherent with her home medications including hydroxyurea, folic acid and even pain medications. She appeared depressed, saying "I'm tired", "I don't sleep enough", "I know what to do but I am not motivated to do them". She however denies any suicidal thoughts or ideation. She rated her pain at 10 out of 10 today, she denies any fever, no urinary symptoms, no headache, no shortness of breath, no chest pain, no nausea, vomiting or diarrhea. No joint swelling or redness.  Hospital Course:  Brittany Foster was admitted to the day hospital with sickle cell painful crisis. Patient was treated with weight based IV Dilaudid PCA, IV Toradol as well as IV fluids. Brittany Foster showed significant improvement symptomatically, pain improved from 10 to 5/10 at the time of discharge. Patient was discharged home is a hemodynamically stable condition. Brittany Foster will follow-up at the clinic as previously scheduled, continue with home medications as per prior to admission. She was seen by case manager with [facility] today at the [facility], plans were made to assist Brittany Foster on daily basis, taking her medications as prescribed and getting out of the house for some activities. She will also be assisted  with possibly getting a job.   Discharge Instructions We discussed the need for good hydration, monitoring of hydration status, avoidance of heat, cold, stress, and infection triggers. We discussed the need to be compliant with taking Hydrea. Brittany Foster was reminded of the need to seek medical attention of any symptoms of bleeding, anemia, or infection occurs.  Discharge Exam: Vitals:   12/03/15 1335 12/03/15 1433  BP: (!) 95/49 (!) 101/59  Pulse: 77 83  Resp: 18 13  Temp:     General appearance: alert, cooperative and no distress Eyes: conjunctivae/corneas clear. PERRL, EOM's intact. Fundi benign. Neck: no adenopathy, no carotid bruit, no JVD, supple, symmetrical, trachea midline and thyroid not enlarged, symmetric, no tenderness/mass/nodules Back: symmetric, no curvature. ROM normal. No CVA tenderness. Resp: clear to auscultation bilaterally Chest wall: no tenderness Cardio: regular rate and rhythm, S1, S2 normal, no murmur, click, rub or gallop GI: soft, non-tender; bowel sounds normal; no masses, no organomegaly Extremities: extremities normal, atraumatic, no cyanosis or edema Pulses: 2+ and symmetric Skin: Skin color, texture, turgor normal. No rashes or lesions Neurologic: Grossly normal   Current Discharge Medication List    CONTINUE these medications which have NOT CHANGED   Details  DULoxetine (CYMBALTA) 60 MG capsule TAKE 1 CAPSULE BY MOUTH DAILY. Qty: 30 capsule, Refills: 3   Associated Diagnoses: Chronic pain syndrome    folic acid (FOLVITE) 1 MG tablet Take 1 tablet (1 mg total) by mouth daily. Qty: 90 tablet, Refills: 11   Associated Diagnoses: Hb-SS disease without crisis (HCC)    gabapentin (NEURONTIN) 300 MG capsule TAKE 1 CAPSULE BY  MOUTH 3 TIMES DAILY. Qty: 90 capsule, Refills: 3    hydroxyurea (HYDREA) 500 MG capsule TAKE 2 CAPSULES BY MOUTH DAILY. MAY TAKE WITH FOOD TO MINIMIZE GI SIDE EFFECTS. Qty: 60 capsule, Refills: 3   Associated Diagnoses: Hb-SS  disease without crisis (HCC)    ibuprofen (ADVIL,MOTRIN) 200 MG tablet Take 400 mg by mouth every 6 (six) hours as needed for moderate pain.     morphine (MS CONTIN) 30 MG 12 hr tablet Take 1 tablet (30 mg total) by mouth every 12 (twelve) hours. Qty: 60 tablet, Refills: 0   Associated Diagnoses: Hb-SS disease without crisis (HCC)    oxyCODONE-acetaminophen (PERCOCET) 10-325 MG tablet Take 1 tablet by mouth every 4 (four) hours as needed for pain. Qty: 90 tablet, Refills: 0    potassium chloride SA (K-DUR,KLOR-CON) 20 MEQ tablet Take 2 tablets (40 mEq total) by mouth daily. Qty: 30 tablet, Refills: 1    Topiramate ER 100 MG CP24 Take 100 mg by mouth at bedtime.       Allergies  Allergen Reactions  . Ultram [Tramadol] Other (See Comments)    seizures  . Zofran [Ondansetron Hcl] Nausea And Vomiting  . Buprenorphine Hcl Hives and Rash    Shaking Tolerates Percocet, Norco, and buprenorphine  . Morphine And Related Hives, Rash and Other (See Comments)    Shaking Tolerates Percocet, Norco, Dilaudid, and buprenorphine  . Tape Rash     Significant Diagnostic Studies: Dg Chest 2 View  Result Date: 11/21/2015 CLINICAL DATA:  Generalized chest pain for 2 days. History of sickle cell. EXAM: CHEST  2 VIEW COMPARISON:  11/04/2015 FINDINGS: Right central venous catheter with tip over the cavoatrial junction. No pneumothorax. Normal heart size and pulmonary vascularity. No focal airspace disease or consolidation in the lungs. No blunting of costophrenic angles. No pneumothorax. Mediastinal contours appear intact. IMPRESSION: No active cardiopulmonary disease. Electronically Signed   By: Burman NievesWilliam  Stevens M.D.   On: 11/21/2015 22:59   Dg Chest 2 View  Result Date: 11/04/2015 CLINICAL DATA:  30 y/o female with sickle cell crisis onset today, c/o LEFT CP and leg pain. Hx heart murmur, MI, PE, former smoker. EXAM: CHEST  2 VIEW COMPARISON:  10/19/2015 FINDINGS: Right central line tip overlies the  level of superior vena cava. Heart size is normal. Lungs are clear. Visualized osseous structures have a normal appearance. Surgical clips are noted in the right upper quadrant the abdomen. IMPRESSION: No active cardiopulmonary disease. Electronically Signed   By: Norva PavlovElizabeth  Brown M.D.   On: 11/04/2015 18:10    Signed:  Jeanann LewandowskyJEGEDE, Brittany Whittenberg MD, MHA, FACP, FAAP, CPE   12/03/2015, 3:18 PM

## 2015-12-03 NOTE — Progress Notes (Addendum)
"  That dose on the PCA is never enough. It never has worked!  Why can't I have the IV Benadryl, the pill never works". Explained dosing and route on both medications. Talked with patient about narcotic safety and dependence and that  the body can build a resistance and ventilation safety has to be one of the main concerns.

## 2015-12-03 NOTE — Telephone Encounter (Signed)
Patient notified per Dr. Hyman HopesJegede [facility] She agrees and will come in.

## 2015-12-03 NOTE — Progress Notes (Signed)
[facility] Pt states her pain is in her rib cage and back. Pt's pain # was 8/10. Pt was treated with IV fluids, Dilaudid PCA, Toraldol and a tone time push of IV Dilaudid. Pt's pain # was at 7 at discharge. Centra line was flushed per protocol also.Pt received d/c instructions with verbal understanding. Pt was alert, oriented and ambulatory at discharge.

## 2015-12-05 ENCOUNTER — Telehealth (HOSPITAL_COMMUNITY): Payer: Self-pay | Admitting: *Deleted

## 2015-12-05 ENCOUNTER — Non-Acute Institutional Stay (HOSPITAL_COMMUNITY)
Admission: AD | Admit: 2015-12-05 | Discharge: 2015-12-05 | Discharge status: Against Medical Advice | Payer: Medicaid Other | Source: Ambulatory Visit | Attending: Internal Medicine | Admitting: Internal Medicine

## 2015-12-05 ENCOUNTER — Encounter (HOSPITAL_COMMUNITY): Payer: Self-pay | Admitting: *Deleted

## 2015-12-05 DIAGNOSIS — F339 Major depressive disorder, recurrent, unspecified: Secondary | ICD-10-CM | POA: Insufficient documentation

## 2015-12-05 DIAGNOSIS — Z79899 Other long term (current) drug therapy: Secondary | ICD-10-CM | POA: Diagnosis not present

## 2015-12-05 DIAGNOSIS — D5700 Hb-SS disease with crisis, unspecified: Secondary | ICD-10-CM | POA: Diagnosis present

## 2015-12-05 DIAGNOSIS — D571 Sickle-cell disease without crisis: Secondary | ICD-10-CM | POA: Insufficient documentation

## 2015-12-05 DIAGNOSIS — Z79891 Long term (current) use of opiate analgesic: Secondary | ICD-10-CM | POA: Diagnosis not present

## 2015-12-05 DIAGNOSIS — Z9114 Patient's other noncompliance with medication regimen: Secondary | ICD-10-CM | POA: Insufficient documentation

## 2015-12-05 MED ORDER — HYDROMORPHONE HCL 2 MG/ML IJ SOLN
1.0000 mg | INTRAMUSCULAR | Status: DC | PRN
  Administered 2015-12-05 (×2): 1 mg via INTRAVENOUS
  Filled 2015-12-05 (×2): qty 1

## 2015-12-05 MED ORDER — POLYETHYLENE GLYCOL 3350 17 G PO PACK: 17.0000 g | PACK | Freq: Every day | ORAL | Status: DC | PRN

## 2015-12-05 MED ORDER — SENNOSIDES-DOCUSATE SODIUM 8.6-50 MG PO TABS: 1.0000 | ORAL_TABLET | Freq: Two times a day (BID) | ORAL | Status: DC

## 2015-12-05 MED ORDER — SODIUM CHLORIDE 0.9% FLUSH: 10.0000 mL | INTRAVENOUS | Status: DC | PRN

## 2015-12-05 MED ORDER — HEPARIN SOD (PORK) LOCK FLUSH 100 UNIT/ML IV SOLN
500.0000 [IU] | INTRAVENOUS | Status: AC | PRN
  Administered 2015-12-05: 500 [IU]
  Filled 2015-12-05: qty 5

## 2015-12-05 MED ORDER — MORPHINE SULFATE ER 30 MG PO TBCR
60.0000 mg | EXTENDED_RELEASE_TABLET | Freq: Two times a day (BID) | ORAL | Status: DC
  Administered 2015-12-05: 60 mg via ORAL
  Filled 2015-12-05: qty 2

## 2015-12-05 MED ORDER — SODIUM CHLORIDE 0.45 % IV SOLN
INTRAVENOUS | Status: DC
Start: 2015-12-05 — End: 2015-12-05
  Administered 2015-12-05: 10:00:00 via INTRAVENOUS

## 2015-12-05 MED ORDER — KETOROLAC TROMETHAMINE 30 MG/ML IJ SOLN
30.0000 mg | Freq: Four times a day (QID) | INTRAMUSCULAR | Status: DC
  Administered 2015-12-05: 30 mg via INTRAVENOUS
  Filled 2015-12-05: qty 1

## 2015-12-05 NOTE — Progress Notes (Signed)
Patient requesting to leave so she can go to the emergency department. Requesting Dr.l Hyman HopesJegede to be call.

## 2015-12-05 NOTE — Progress Notes (Signed)
Pt requests to leave center; pt states that she will not wait for provider to respond and assess for discharge; pt informed that leaving at this time would be against medical advice; pt requests to continue to leave; central line deaccessed and flushed; pt alert, ambulatory, oriented; no complications noted

## 2015-12-05 NOTE — H&P (Signed)
[facility] History and Physical  Brittany Foster ZOX:[id number] DOB: [birth year] DOA: 12/05/2015  PCP: Jeanann Lewandowsky, MD   Chief Complaint: General body pain   HPI: Brittany Foster is a 30 y.o. female with history of sickle cell disease whopresented to the day hospital today with complaint of pain in her back, hip joints and legs consistent with her sickle cell pain. She took oxycodone 10 mg and MS contin this morning without relief.She was seen and managed here 2 days ago for the same complaint. Patient continued to be non-adherent with her home medications including hydroxyurea, folic acid and even pain medications. She exhibits behaviors suggestive of "narcotic seeking" even though she does have sickle cell and have reasons to have pain, but she consistently demand for certain dosage and frequency of dilaudid administration, and also asking for IV benadryl or nothing. She has been extensively counseled repeatedly and offered different forms of assistance including vocational rehab, volunteer jobs, psychiatry and/behavioral therapist referral. On each occasion, patient had declined any and all form of assistance saying "I know what I should be doing, I just don't want to do them". And she said " I don't want to talk to anybody". She appeared depressed, saying "I'm tired", "I don't sleep enough", "I know what to do but I am not motivated to do them" yet she declined to see psychiatrist and/or therapist. She has been referred several times. She however denies any suicidal thoughts or ideation. She rated her pain at 10 out of 10 today, she denies any fever, no urinary symptoms, no headache, no shortness of breath, no chest pain, no nausea, vomiting or diarrhea. No joint swelling or redness  Systemic Review: General: The patient denies anorexia, fever, weight loss Cardiac: Denies chest pain, syncope, palpitations, pedal edema  Respiratory: Denies cough, shortness of breath, wheezing GI:  Denies severe indigestion/heartburn, abdominal pain, nausea, vomiting, diarrhea and constipation GU: Denies hematuria, incontinence, dysuria  Musculoskeletal: Denies arthritis  Skin: Denies suspicious skin lesions Neurologic: Denies focal weakness or numbness, change in vision  Past Medical History:  Diagnosis Date  . Anemia   . Depression, major, recurrent (HCC)   . Migraines   . Sickle cell anemia (HCC)     Past Surgical History:  Procedure Laterality Date  . CESAREAN SECTION    . CHOLECYSTECTOMY  2000  . IR GENERIC HISTORICAL  10/08/2015   IR US GUIDE VASC ACCESS RIGHT 10/08/2015 Simonne Come, MD [facility]  . IR GENERIC HISTORICAL  10/08/2015   IR FLUORO GUIDE CV LINE RIGHT 10/08/2015 Simonne Come, MD [facility]  . MULTIPLE TOOTH EXTRACTIONS N/A   . port a cath placement Right    about 6-7 years ago  . removal of porta cath Right 09/11/15  . TUBAL LIGATION      Allergies  Allergen Reactions  . Ultram [Tramadol] Other (See Comments)    seizures  . Zofran [Ondansetron Hcl] Nausea And Vomiting  . Buprenorphine Hcl Hives and Rash    Shaking Tolerates Percocet, Norco, and buprenorphine  . Morphine And Related Hives, Rash and Other (See Comments)    Shaking Tolerates Percocet, Norco, Dilaudid, and buprenorphine  . Tape Rash    Family History  Problem Relation Age of Onset  . Sickle cell trait Father   . Sickle cell trait Mother   . Sickle cell anemia Other       Prior to Admission medications   Medication Sig Start Date End Date Taking? Authorizing Provider  DULoxetine (CYMBALTA) 60  MG capsule TAKE 1 CAPSULE BY MOUTH DAILY. 11/27/15  Yes Quentin Angst, MD  folic acid (FOLVITE) 1 MG tablet Take 1 tablet (1 mg total) by mouth daily. 08/08/15  Yes [name], FNP  hydroxyurea (HYDREA) 500 MG capsule TAKE 2 CAPSULES BY MOUTH DAILY. MAY TAKE WITH FOOD TO MINIMIZE GI SIDE EFFECTS. Patient taking differently: Take 500 mg by mouth daily. MAY TAKE WITH FOOD TO MINIMIZE  GI SIDE EFFECTS. 08/08/15  Yes [name], FNP  ibuprofen (ADVIL,MOTRIN) 200 MG tablet Take 400 mg by mouth every 6 (six) hours as needed for moderate pain.    Yes Historical Provider, MD  morphine (MS CONTIN) 30 MG 12 hr tablet Take 1 tablet (30 mg total) by mouth every 12 (twelve) hours. 11/28/15  Yes Quentin Angst, MD  oxyCODONE-acetaminophen (PERCOCET) 10-325 MG tablet Take 1 tablet by mouth every 4 (four) hours as needed for pain. 11/28/15  Yes Quentin Angst, MD  Topiramate ER 100 MG CP24 Take 100 mg by mouth at bedtime.   Yes Historical Provider, MD  gabapentin (NEURONTIN) 300 MG capsule TAKE 1 CAPSULE BY MOUTH 3 TIMES DAILY. 11/27/15   Quentin Angst, MD  potassium chloride SA (K-DUR,KLOR-CON) 20 MEQ tablet Take 2 tablets (40 mEq total) by mouth daily. 11/23/15   Altha Harm, MD     Physical Exam: Vitals:   12/05/15 0930  BP: 103/68  Pulse: 87  Resp: 20  Temp: 98.8 F (37.1 C)  SpO2: 95%  Weight: 125 lb (56.7 kg)  Height: 5' (1.524 m)    General: Alert, awake, afebrile, anicteric, not in obvious distress HEENT: Normocephalic and Atraumatic, Mucous membranes pink                PERRLA; EOM intact; No scleral icterus,                 Nares: Patent, Oropharynx: Clear, Fair Dentition                 Neck: FROM, no cervical lymphadenopathy, thyromegaly, carotid bruit or JVD;  CHEST WALL: No tenderness  CHEST: Normal respiration, clear to auscultation bilaterally  HEART: Regular rate and rhythm; no murmurs rubs or gallops  BACK: No kyphosis or scoliosis; no CVA tenderness  ABDOMEN: Positive Bowel Sounds, soft, non-tender; no masses, no organomegaly EXTREMITIES: No cyanosis, clubbing, or edema SKIN:  no rash or ulceration  CNS: Alert and Oriented x 4, Nonfocal exam, CN 2-12 intact  Labs on Admission:  Basic Metabolic Panel: No results for input(s): NA, K, CL, CO2, GLUCOSE, BUN, CREATININE, CALCIUM, MG, PHOS in the last 168 hours. Liver Function  Tests: No results for input(s): AST, ALT, ALKPHOS, BILITOT, PROT, ALBUMIN in the last 168 hours. No results for input(s): LIPASE, AMYLASE in the last 168 hours. No results for input(s): AMMONIA in the last 168 hours. CBC:  Recent Labs Lab 12/03/15 1028  WBC 8.8  NEUTROABS 4.3  HGB 8.8*  HCT 24.5*  MCV 97.2  PLT 288   Cardiac Enzymes: No results for input(s): CKTOTAL, CKMB, CKMBINDEX, TROPONINI in the last 168 hours.  BNP (last 3 results) No results for input(s): BNP in the last 8760 hours.  ProBNP (last 3 results) No results for input(s): PROBNP in the last 8760 hours.  CBG: No results for input(s): GLUCAP in the last 168 hours.   Assessment/Plan Active Problems:   Sickle cell anemia with pain (HCC)   Admits to the [facility]  IVF D5 .  45% Saline @ 125 mls/hour  Individualized care plan: MS Contin 60 mg PO x1 + IV Dilaudid 1 mg Q 2 H RRN  IV Toradol 30 mg Q 6 H  Monitor vitals very closely, Re-evaluate pain scale every hour  2 L of Oxygen by [place]  Patient will be re-evaluated for pain in the context of function and relationship to baseline as care progresses.  If no significant relieve from pain (remains above 5/10) will transfer patient to inpatient services for further evaluation and management  Code Status: Full  Family Communication: None  DVT Prophylaxis: Ambulate as tolerated   Time spent: 35 Minutes  Citlaly Camplin, MD, MHA, FACP, FAAP, CPE  If 7PM-7AM, please contact night-coverage [email or web address] 12/05/2015, 9:53 AM

## 2015-12-05 NOTE — Telephone Encounter (Signed)
Called patient after speaking with the provider to let her know she can come in for treatment. Pt voiced understanding.

## 2015-12-05 NOTE — Telephone Encounter (Signed)
[facility]. Pt states her pain is 8/10 and she took her last pain med at 5am this morning of Oxycodone. Pt states she has generalized pain. Will check with the provider and give her a call back. Pt voiced understanding.

## 2015-12-05 NOTE — Discharge Summary (Signed)
Physician Discharge Summary  Brittany Foster:[id number] DOB: 18-Feb-1986 DOA: 12/05/2015  PCP: Jeanann Lewandowsky, MD  Admit date: 12/05/2015  Discharge date: 12/05/2015  Time spent: 30 minutes  Discharge Diagnoses:  Active Problems:   Sickle cell anemia with pain [facility])   Discharge Condition: Stable  Diet recommendation: Regular  Filed Weights   12/05/15 0930  Weight: 125 lb (56.7 kg)    History of present illness:  Brittany Foster is a 30 y.o. female with history of sickle cell disease whopresented to the day hospital today with complaint of pain in her back, hip joints and legs consistent with her sickle cell pain. She took oxycodone 10 mg and MS contin this morning without relief.She was seen and managed here 2 days ago for the same complaint. Patient continued to be non-adherent with her home medications including hydroxyurea, folic acid and even pain medications. She exhibits behaviors suggestive of "narcotic seeking" even though she does have sickle cell and have reasons to have pain, but she consistently demand for certain dosage and frequency of dilaudid administration, and also asking for IV benadryl or nothing. She has been extensively counseled repeatedly and offered different forms of assistance including vocational rehab, volunteer jobs, psychiatry and/behavioral therapist referral. On each occasion, patient had declined any and all form of assistance saying "I know what I should be doing, I just don't want to do them". And she said " I don't want to talk to anybody". She appeared depressed, saying "I'm tired", "I don't sleep enough", "I know what to do but I am not motivated to do them" yet she declined to see psychiatrist and/or therapist. She has been referred several times. She however denies any suicidal thoughts or ideation. She rated her pain at 10 out of 10 today, she denies any fever, no urinary symptoms, no headache, no shortness of breath, no chest pain, no nausea,  vomiting or diarrhea. No joint swelling or redness  Hospital Course:  Brittany Foster was admitted to the day hospital with sickle cell pain.She was treated with using her individualized care plan including IV Dilaudid 1 mg Q 2 H prn and PO MS Contin 60 mg x 1, IV Fluid D5NS, IV Toradol as well as IV fluids. Brittany Foster said she was not satisfied with her regimen, she complained of not getting enough medication, this dissatisfaction started as soon as she knew there was no PCA. Her behavior is suspicious of "drug seeking". She got 2 doses of IV Dilaudid, IVF and one dose of Toradol IV before she left the day hospital AMA. Brittany Foster will follow-up at the clinic as previously scheduled, continue with home medications as per prior to admission.  Discharge Instructions We discussed the need for good hydration, monitoring of hydration status, avoidance of heat, cold, stress, and infection triggers. We discussed the need to be compliant with taking Hydrea. Brittany Foster was reminded of the need to seek medical attention of any symptoms of bleeding, anemia, or infection occurs.  Discharge Exam: Vitals:   12/05/15 0930  BP: 103/68  Pulse: 87  Resp: 20  Temp: 98.8 F (37.1 C)    General appearance: alert, cooperative and no distress Eyes: conjunctivae/corneas clear. PERRL, EOM's intact. Fundi benign. Neck: no adenopathy, no carotid bruit, no JVD, supple, symmetrical, trachea midline and thyroid not enlarged, symmetric, no tenderness/mass/nodules Back: symmetric, no curvature. ROM normal. No CVA tenderness. Resp: clear to auscultation bilaterally Chest wall: no tenderness Cardio: regular rate and rhythm, S1, S2 normal, no murmur, click, rub or gallop GI:  soft, non-tender; bowel sounds normal; no masses, no organomegaly Extremities: extremities normal, atraumatic, no cyanosis or edema Pulses: 2+ and symmetric Skin: Skin color, texture, turgor normal. No rashes or lesions Neurologic: Grossly  normal   Discharge Medication List as of 12/05/2015  1:08 PM    CONTINUE these medications which have NOT CHANGED   Details  DULoxetine (CYMBALTA) 60 MG capsule TAKE 1 CAPSULE BY MOUTH DAILY., Normal    folic acid (FOLVITE) 1 MG tablet Take 1 tablet (1 mg total) by mouth daily., Starting 08/08/2015, Until Discontinued, Normal    hydroxyurea (HYDREA) 500 MG capsule TAKE 2 CAPSULES BY MOUTH DAILY. MAY TAKE WITH FOOD TO MINIMIZE GI SIDE EFFECTS., Normal    ibuprofen (ADVIL,MOTRIN) 200 MG tablet Take 400 mg by mouth every 6 (six) hours as needed for moderate pain. , Until Discontinued, Historical Med    morphine (MS CONTIN) 30 MG 12 hr tablet Take 1 tablet (30 mg total) by mouth every 12 (twelve) hours., Starting Thu 11/28/2015, Print    oxyCODONE-acetaminophen (PERCOCET) 10-325 MG tablet Take 1 tablet by mouth every 4 (four) hours as needed for pain., Starting Thu 11/28/2015, Print    Topiramate ER 100 MG CP24 Take 100 mg by mouth at bedtime., Until Discontinued, Historical Med    gabapentin (NEURONTIN) 300 MG capsule TAKE 1 CAPSULE BY MOUTH 3 TIMES DAILY., Normal    potassium chloride SA (K-DUR,KLOR-CON) 20 MEQ tablet Take 2 tablets (40 mEq total) by mouth daily., Starting Sat 11/23/2015, Normal       Allergies  Allergen Reactions  . Ultram [Tramadol] Other (See Comments)    seizures  . Zofran [Ondansetron Hcl] Nausea And Vomiting  . Buprenorphine Hcl Hives and Rash    Shaking Tolerates Percocet, Norco, and buprenorphine  . Morphine And Related Hives, Rash and Other (See Comments)    Shaking Tolerates Percocet, Norco, Dilaudid, and buprenorphine  . Tape Rash     Significant Diagnostic Studies: Dg Chest 2 View  Result Date: 11/21/2015 CLINICAL DATA:  Generalized chest pain for 2 days. History of sickle cell. EXAM: CHEST  2 VIEW COMPARISON:  11/04/2015 FINDINGS: Right central venous catheter with tip over the cavoatrial junction. No pneumothorax. Normal heart size and pulmonary  vascularity. No focal airspace disease or consolidation in the lungs. No blunting of costophrenic angles. No pneumothorax. Mediastinal contours appear intact. IMPRESSION: No active cardiopulmonary disease. Electronically Signed   By: Burman NievesWilliam  Stevens M.D.   On: 11/21/2015 22:59    Signed:  Jeanann LewandowskyJEGEDE, Ruddy Swire MD, MHA, FACP, FAAP, CPE   12/05/2015, 6:06 PM

## 2015-12-06 ENCOUNTER — Telehealth (HOSPITAL_COMMUNITY): Payer: Self-pay | Admitting: *Deleted

## 2015-12-06 ENCOUNTER — Non-Acute Institutional Stay (HOSPITAL_COMMUNITY)
Admission: AD | Admit: 2015-12-06 | Discharge: 2015-12-06 | Disposition: A | Discharge status: Home / Self Care | Payer: Medicaid Other | Source: Ambulatory Visit | Attending: Internal Medicine | Admitting: Internal Medicine

## 2015-12-06 ENCOUNTER — Encounter (HOSPITAL_COMMUNITY): Payer: Self-pay | Admitting: *Deleted

## 2015-12-06 DIAGNOSIS — Z9114 Patient's other noncompliance with medication regimen: Secondary | ICD-10-CM | POA: Diagnosis not present

## 2015-12-06 DIAGNOSIS — D5700 Hb-SS disease with crisis, unspecified: Secondary | ICD-10-CM | POA: Insufficient documentation

## 2015-12-06 DIAGNOSIS — F339 Major depressive disorder, recurrent, unspecified: Secondary | ICD-10-CM | POA: Diagnosis not present

## 2015-12-06 MED ORDER — PROMETHAZINE HCL 25 MG PO TABS
12.5000 mg | ORAL_TABLET | ORAL | Status: DC | PRN
  Administered 2015-12-06: 25 mg via ORAL
  Filled 2015-12-06: qty 1

## 2015-12-06 MED ORDER — HYDROMORPHONE HCL 2 MG/ML IJ SOLN
2.0000 mg | Freq: Once | INTRAMUSCULAR | Status: AC
  Administered 2015-12-06: 2 mg via INTRAVENOUS

## 2015-12-06 MED ORDER — MORPHINE SULFATE ER 30 MG PO TBCR
60.0000 mg | EXTENDED_RELEASE_TABLET | Freq: Two times a day (BID) | ORAL | Status: DC
  Administered 2015-12-06: 60 mg via ORAL
  Filled 2015-12-06: qty 2

## 2015-12-06 MED ORDER — POLYETHYLENE GLYCOL 3350 17 G PO PACK: 17.0000 g | PACK | Freq: Every day | ORAL | Status: DC | PRN

## 2015-12-06 MED ORDER — HYDROMORPHONE HCL 2 MG/ML IJ SOLN
1.0000 mg | INTRAMUSCULAR | Status: DC | PRN
  Administered 2015-12-06 (×2): 1 mg via INTRAVENOUS
  Filled 2015-12-06 (×3): qty 1

## 2015-12-06 MED ORDER — HEPARIN SOD (PORK) LOCK FLUSH 100 UNIT/ML IV SOLN
250.0000 [IU] | INTRAVENOUS | Status: AC | PRN
  Administered 2015-12-06: 250 [IU]
  Filled 2015-12-06: qty 5

## 2015-12-06 MED ORDER — SODIUM CHLORIDE 0.9% FLUSH
10.0000 mL | INTRAVENOUS | Status: AC | PRN
  Administered 2015-12-06: 10 mL

## 2015-12-06 MED ORDER — DEXTROSE-NACL 5-0.45 % IV SOLN
INTRAVENOUS | Status: DC
  Administered 2015-12-06: 12:00:00 via INTRAVENOUS

## 2015-12-06 MED ORDER — KETOROLAC TROMETHAMINE 30 MG/ML IJ SOLN
30.0000 mg | Freq: Four times a day (QID) | INTRAMUSCULAR | Status: DC
  Administered 2015-12-06: 30 mg via INTRAVENOUS
  Filled 2015-12-06: qty 1

## 2015-12-06 MED ORDER — SENNOSIDES-DOCUSATE SODIUM 8.6-50 MG PO TABS
1.0000 | ORAL_TABLET | Freq: Two times a day (BID) | ORAL | Status: DC
Start: 2015-12-06 — End: 2015-12-06

## 2015-12-06 MED ORDER — PROMETHAZINE HCL 12.5 MG RE SUPP
12.5000 mg | RECTAL | Status: DC | PRN
  Filled 2015-12-06: qty 2

## 2015-12-06 NOTE — Telephone Encounter (Signed)
Called in complaining of "pain all over", with a  9/10 pain intensity. Took MS Contin and oxycodone at 6 am without relief. Denies fever, chest pain, N/V, diarrhea. Has some abdominal pain. States "I just want to see what he wants me to do".

## 2015-12-06 NOTE — Progress Notes (Signed)
Patient ID: Brittany Foster, female   DOB: 10/15/1985, 30 y.o.   MRN: [id number] [facility]. Rating intensity 10/10. Treated with IV fluids, IV Toradol, po MS Contin and IVP of Dilaudid. See previous notes for this admission. Central line PICC in right chest is intact and site clear, dressing intact. Went over discharge instructions and copy given to patient. Alert, oriented and ambulatory at time of discharge. Discharged to home.

## 2015-12-06 NOTE — H&P (Signed)
[facility] History and Physical  Brittany CampbellMiranda Caldwell ZOX:[id number] DOB: 02/09/1986 DOA: 12/06/2015  PCP: Jeanann LewandowskyJEGEDE, Ireanna Finlayson, MD   Chief Complaint: Pain all over  HPI: Brittany Foster is a 30 y.o. opiate tolerant female with history of sickle cell disease whopresented to the day hospital today with complaint of pain in her back, hip joints and legs consistent with her sickle cell pain crisis. She was seen here yesterday but she left AMA because she said she was not getting enough IV pain medication and did not agree with her care plan of IVF, PO MS Contin 60 mg and IV dilaudid 1 - 2 mg Q 2 H.Patient is not adherent with her home medications including hydroxyurea, folic acid and even pain medications. She appeared depressed and she blames social and financial stressors on her frequent sickle cell crises. She however denies any suicidal thoughts or ideation. She rated her pain at 10 out of 10 today, she denies any fever, no urinary symptoms, no headache, no shortness of breath, no chest pain, no nausea, vomiting or diarrhea. No joint swelling or redness.  Systemic Review: General: The patient denies anorexia, fever, weight loss Cardiac: Denies chest pain, syncope, palpitations, pedal edema  Respiratory: Denies cough, shortness of breath, wheezing GI: Denies severe indigestion/heartburn, abdominal pain, nausea, vomiting, diarrhea and constipation GU: Denies hematuria, incontinence, dysuria  Musculoskeletal: Denies arthritis  Skin: Denies suspicious skin lesions Neurologic: Denies focal weakness or numbness, change in vision  Past Medical History:  Diagnosis Date  . Anemia   . Depression, major, recurrent (HCC)   . Migraines   . Sickle cell anemia (HCC)     Past Surgical History:  Procedure Laterality Date  . CESAREAN SECTION    . CHOLECYSTECTOMY  2000  . IR GENERIC HISTORICAL  10/08/2015   IR US GUIDE VASC ACCESS RIGHT 10/08/2015 Simonne ComeJohn Watts, MD [facility]  . IR GENERIC HISTORICAL   10/08/2015   IR FLUORO GUIDE CV LINE RIGHT 10/08/2015 Simonne ComeJohn Watts, MD [facility]  . MULTIPLE TOOTH EXTRACTIONS N/A   . port a cath placement Right    about 6-7 years ago  . removal of porta cath Right 09/11/15  . TUBAL LIGATION      Allergies  Allergen Reactions  . Ultram [Tramadol] Other (See Comments)    seizures  . Zofran [Ondansetron Hcl] Nausea And Vomiting  . Buprenorphine Hcl Hives and Rash    Shaking Tolerates Percocet, Norco, and buprenorphine  . Morphine And Related Hives, Rash and Other (See Comments)    Shaking Tolerates Percocet, Norco, Dilaudid, and buprenorphine  . Tape Rash    Family History  Problem Relation Age of Onset  . Sickle cell trait Father   . Sickle cell trait Mother   . Sickle cell anemia Other       Prior to Admission medications   Medication Sig Start Date End Date Taking? Authorizing Provider  DULoxetine (CYMBALTA) 60 MG capsule TAKE 1 CAPSULE BY MOUTH DAILY. 11/27/15  Yes Quentin Angstlugbemiga E Lylie Blacklock, MD  folic acid (FOLVITE) 1 MG tablet Take 1 tablet (1 mg total) by mouth daily. 08/08/15  Yes Massie MaroonLachina M [name], FNP  hydroxyurea (HYDREA) 500 MG capsule TAKE 2 CAPSULES BY MOUTH DAILY. MAY TAKE WITH FOOD TO MINIMIZE GI SIDE EFFECTS. Patient taking differently: Take 500 mg by mouth daily. MAY TAKE WITH FOOD TO MINIMIZE GI SIDE EFFECTS. 08/08/15  Yes Massie MaroonLachina M [name], FNP  ibuprofen (ADVIL,MOTRIN) 200 MG tablet Take 400 mg by mouth every 6 (six) hours  as needed for moderate pain.    Yes Historical Provider, MD  morphine (MS CONTIN) 30 MG 12 hr tablet Take 1 tablet (30 mg total) by mouth every 12 (twelve) hours. 11/28/15  Yes Quentin Angst, MD  oxyCODONE-acetaminophen (PERCOCET) 10-325 MG tablet Take 1 tablet by mouth every 4 (four) hours as needed for pain. 11/28/15  Yes Quentin Angst, MD  Topiramate ER 100 MG CP24 Take 100 mg by mouth at bedtime.   Yes Historical Provider, MD  gabapentin (NEURONTIN) 300 MG capsule TAKE 1 CAPSULE BY MOUTH 3 TIMES DAILY.  11/27/15   Quentin Angst, MD  potassium chloride SA (K-DUR,KLOR-CON) 20 MEQ tablet Take 2 tablets (40 mEq total) by mouth daily. 11/23/15   Altha Harm, MD     Physical Exam: Vitals:   12/06/15 1145 12/06/15 1356  BP: 102/61 103/72  Pulse: 86 73  Resp: 16 18  Temp: 98.7 F (37.1 C)   TempSrc: Oral   SpO2: 96% 95%  Weight: 125 lb (56.7 kg)   Height: 5' (1.524 m)     General: Alert, awake, afebrile, anicteric, not in obvious distress HEENT: Normocephalic and Atraumatic, Mucous membranes pink                PERRLA; EOM intact; No scleral icterus,                 Nares: Patent, Oropharynx: Clear, Fair Dentition                 Neck: FROM, no cervical lymphadenopathy, thyromegaly, carotid bruit or JVD;  CHEST WALL: No tenderness  CHEST: Normal respiration, clear to auscultation bilaterally  HEART: Regular rate and rhythm; no murmurs rubs or gallops  BACK: No kyphosis or scoliosis; no CVA tenderness  ABDOMEN: Positive Bowel Sounds, soft, non-tender; no masses, no organomegaly EXTREMITIES: No cyanosis, clubbing, or edema SKIN:  no rash or ulceration  CNS: Alert and Oriented x 4, Nonfocal exam, CN 2-12 intact  Labs on Admission:  Basic Metabolic Panel: No results for input(s): NA, K, CL, CO2, GLUCOSE, BUN, CREATININE, CALCIUM, MG, PHOS in the last 168 hours. Liver Function Tests: No results for input(s): AST, ALT, ALKPHOS, BILITOT, PROT, ALBUMIN in the last 168 hours. No results for input(s): LIPASE, AMYLASE in the last 168 hours. No results for input(s): AMMONIA in the last 168 hours. CBC:  Recent Labs Lab 12/03/15 1028  WBC 8.8  NEUTROABS 4.3  HGB 8.8*  HCT 24.5*  MCV 97.2  PLT 288   Cardiac Enzymes: No results for input(s): CKTOTAL, CKMB, CKMBINDEX, TROPONINI in the last 168 hours.  BNP (last 3 results) No results for input(s): BNP in the last 8760 hours.  ProBNP (last 3 results) No results for input(s): PROBNP in the last 8760 hours.  CBG: No  results for input(s): GLUCAP in the last 168 hours.   Assessment/Plan Active Problems:   Sickle cell anemia with pain (HCC)   Admits to the Day Hospital  IVF D5 .45% Saline @ 125 mls/hour  Individualized care plan: MS Contin 60 mg PO x1 + IV Dilaudid 1 mg Q 2 H RRN  IV Toradol 30 mg Q 6 H  Monitor vitals very closely, Re-evaluate pain scale every hour  2 L of Oxygen by [place]  Patient will be re-evaluated for pain in the context of function and relationship to baseline as care progresses.  If no significant relieve from pain (remains above 5/10) will transfer patient to inpatient services for further  evaluation and management  Code Status: Full  Family Communication: None  DVT Prophylaxis: Ambulate as tolerated   Time spent: 35 Minutes  Davari Lopes, MD, MHA, FACP, FAAP, CPE  If 7PM-7AM, please contact night-coverage [email or web address] 12/06/2015, 4:01 PM

## 2015-12-06 NOTE — Progress Notes (Signed)
Came out of room crying, stating "I am hurting so bad".

## 2015-12-06 NOTE — Telephone Encounter (Signed)
Contacted Dr. Hyman HopesJegede about patient's complaints of pain. He advises for patient to continue taking po pain medicine, hydrate really well and stay warm and if she wants to come to the day hospital she can come but the treatment will be the same as yesterday. Patient notified of this and she states.Marland Kitchen.Marland Kitchen."Oh well I am just going to go to the ER then".

## 2015-12-06 NOTE — Progress Notes (Signed)
Talked with patient about how a tolerance to pain medications can cause a decrease effect of the medication.Patient understands and states "Dilaudid always works for my pain." Talked with patient about what she thinks is causing the increase in sickle cell crisis occurrences. She quickly responds with ...."stress". She says that she bought her daughter a bed and a TV from [employer] and now they are contacting her for payment and even coming to her house and she cannot make the bill payment. She says she doesn't know what to do and she doesn't like asking for help from anyone.  We talked about [address] [address] and I gave her the telephone number for the agency, she talked about letting the merchandise go back to the store. I asked her to research this in her spare time. I gave her the telephone numbers for churches in her area. She is going to look for a part time job near her home.

## 2015-12-06 NOTE — Discharge Summary (Signed)
Physician Discharge Summary  Brittany Foster WUJ:[id number] DOB: Jan 06, 1986 DOA: 12/06/2015  PCP: Jeanann Lewandowsky, MD  Admit date: 12/06/2015  Discharge date: 12/06/2015  Time spent: 30 minutes  Discharge Diagnoses:  Active Problems:   Sickle cell anemia with pain [facility])   Discharge Condition: Stable  Diet recommendation: Regular  Filed Weights   12/06/15 1145  Weight: 125 lb (56.7 kg)    History of present illness:  Brittany Foster is a 30 y.o. opiate tolerant female with history of sickle cell disease whopresented to the day hospital today with complaint of pain in her back, hip joints and legs consistent with her sickle cell pain crisis. She was seen here yesterday but she left AMA because she said she was not getting enough IV pain medication and did not agree with her care plan of IVF, PO MS Contin 60 mg and IV dilaudid 1 - 2 mg Q 2 H.Patient is not adherent with her home medications including hydroxyurea, folic acid and even pain medications. She appeared depressed and she blames social and financial stressors on her frequent sickle cell crises. She however denies any suicidal thoughts or ideation. She rated her pain at 10 out of 10 today, she denies any fever, no urinary symptoms, no headache, no shortness of breath, no chest pain, no nausea, vomiting or diarrhea. No joint swelling or redness.  Hospital Course:  Brittany Foster was admitted to the day hospital with sickle cell painful crisis. Patient was treated with IV Dilaudid 2 mg every 2 hours, IV Toradol as well as IV fluids. Brittany Foster again complained bitterly about the frequency of her medication, saying "I don't want to wait 2 hours before I get my medicine." She got 60 mg MS Contin by mouth, IV dilaudid every 2 hours, she got 3 doses and IV Toradol x 1. She left the day hospital again unsatisfied and AMA. Patient left in a hemodynamically stable condition.   Discharge Exam: Vitals:   12/06/15 1145 12/06/15 1356  BP: 102/61  103/72  Pulse: 86 73  Resp: 16 18  Temp: 98.7 F (37.1 C)     General appearance: alert, cooperative and no distress Eyes: conjunctivae/corneas clear. PERRL, EOM's intact. Fundi benign. Neck: no adenopathy, no carotid bruit, no JVD, supple, symmetrical, trachea midline and thyroid not enlarged, symmetric, no tenderness/mass/nodules Back: symmetric, no curvature. ROM normal. No CVA tenderness. Resp: clear to auscultation bilaterally Chest wall: no tenderness Cardio: regular rate and rhythm, S1, S2 normal, no murmur, click, rub or gallop GI: soft, non-tender; bowel sounds normal; no masses, no organomegaly Extremities: extremities normal, atraumatic, no cyanosis or edema Pulses: 2+ and symmetric Skin: Skin color, texture, turgor normal. No rashes or lesions Neurologic: Grossly normal   Current Discharge Medication List    CONTINUE these medications which have NOT CHANGED   Details  DULoxetine (CYMBALTA) 60 MG capsule TAKE 1 CAPSULE BY MOUTH DAILY. Qty: 30 capsule, Refills: 3   Associated Diagnoses: Chronic pain syndrome    folic acid (FOLVITE) 1 MG tablet Take 1 tablet (1 mg total) by mouth daily. 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 MAY TAKE WITH FOOD TO MINIMIZE GI SIDE EFFECTS. Qty: 60 capsule, Refills: 3   Associated Diagnoses: Hb-SS disease without crisis (HCC)    ibuprofen (ADVIL,MOTRIN) 200 MG tablet Take 400 mg by mouth every 6 (six) hours as needed for moderate pain.  morphine (MS CONTIN) 30 MG 12 hr tablet Take 1 tablet (30 mg total) by mouth every 12 (twelve) hours. Qty: 60 tablet, Refills: 0   Associated Diagnoses: Hb-SS disease without crisis (HCC)    oxyCODONE-acetaminophen (PERCOCET) 10-325 MG tablet Take 1 tablet by mouth every 4 (four) hours as needed for pain. Qty: 90 tablet, Refills: 0    Topiramate ER 100 MG CP24 Take 100 mg by  mouth at bedtime.    gabapentin (NEURONTIN) 300 MG capsule TAKE 1 CAPSULE BY MOUTH 3 TIMES DAILY. Qty: 90 capsule, Refills: 3    potassium chloride SA (K-DUR,KLOR-CON) 20 MEQ tablet Take 2 tablets (40 mEq total) by mouth daily. Qty: 30 tablet, Refills: 1       Allergies  Allergen Reactions  . Ultram [Tramadol] Other (See Comments)    seizures  . Zofran [Ondansetron Hcl] Nausea And Vomiting  . Buprenorphine Hcl Hives and Rash    Shaking Tolerates Percocet, Norco, and buprenorphine  . Morphine And Related Hives, Rash and Other (See Comments)    Shaking Tolerates Percocet, Norco, Dilaudid, and buprenorphine  . Tape Rash     Significant Diagnostic Studies: Dg Chest 2 View  Result Date: 11/21/2015 CLINICAL DATA:  Generalized chest pain for 2 days. History of sickle cell. EXAM: CHEST  2 VIEW COMPARISON:  11/04/2015 FINDINGS: Right central venous catheter with tip over the cavoatrial junction. No pneumothorax. Normal heart size and pulmonary vascularity. No focal airspace disease or consolidation in the lungs. No blunting of costophrenic angles. No pneumothorax. Mediastinal contours appear intact. IMPRESSION: No active cardiopulmonary disease. Electronically Signed   By: Burman NievesWilliam  Stevens M.D.   On: 11/21/2015 22:59    Signed:  Jeanann LewandowskyJEGEDE, Brittany Hosmer MD, MHA, FACP, FAAP, CPE   12/06/2015, 4:33 PM

## 2015-12-10 ENCOUNTER — Telehealth: Payer: Self-pay

## 2015-12-10 ENCOUNTER — Ambulatory Visit: Payer: Medicaid Other | Admitting: Family Medicine

## 2015-12-10 ENCOUNTER — Other Ambulatory Visit: Payer: Self-pay | Admitting: Internal Medicine

## 2015-12-10 MED ORDER — OXYCODONE-ACETAMINOPHEN 10-325 MG PO TABS: 1.0000 | ORAL_TABLET | ORAL | 0 refills | Status: DC | PRN

## 2015-12-24 ENCOUNTER — Telehealth (HOSPITAL_COMMUNITY): Payer: Self-pay | Admitting: *Deleted

## 2015-12-24 NOTE — Telephone Encounter (Signed)
Called patient back after speaking with Dr. Hyman HopesJegede and told her that she can come in for treatment. But she would have the same regime as before. Pt stated that she was not coming and wanted me to ask the provider to give her a call.

## 2015-12-24 NOTE — Telephone Encounter (Signed)
Pt called to come [facility] for treatment. Pt states her pain is 8/10 and it is in her arms, legs and back.. She took her last pain med at of MS contin and Oxy at 4am this morning. And she has been vomiting, the last time was yesterday. Pt also states that she would not like the same regime that she was on the last time she was here. I told her that would be up to the provider to decide what her regime is going to be. Will talk with the provider and give her a call back. Pt voiced understanding.

## 2015-12-25 ENCOUNTER — Telehealth (HOSPITAL_COMMUNITY): Payer: Self-pay | Admitting: *Deleted

## 2015-12-25 NOTE — Telephone Encounter (Signed)
Called complaining of 8/10 pain in legs and back. Took MS contin and oxycodone at 5 am without relief. Denies chest pain, fever. Complains of N/V and abdominal pain yesterday. [facility]

## 2015-12-25 NOTE — Telephone Encounter (Signed)
Called patient to check on her ETA. She says the "transportation [employer] [facility]" cannot pick her up today and they are not working tomorrow. She said, "I don't know what to do". The medicaid van cannot pick up the same day of the appointment, the ride has to be scheduled ahead of time patient wants to know if we can schedule her for treatment.Talked with patient about contacting Virgina EvenerMarsha Cullins Social Worker. [name] has her card and says she will call her.

## 2015-12-25 NOTE — Telephone Encounter (Signed)
Returned call to patient regarding treatment for acute pain. Advised per Dr. Hyman HopesJegede [facility] and patient agrees and [facility].

## 2015-12-26 ENCOUNTER — Encounter (HOSPITAL_COMMUNITY): Payer: Self-pay | Admitting: *Deleted

## 2015-12-26 ENCOUNTER — Non-Acute Institutional Stay (HOSPITAL_COMMUNITY)
Admission: AD | Admit: 2015-12-26 | Discharge: 2015-12-26 | Disposition: A | Discharge status: Home / Self Care | Payer: Medicaid Other | Source: Ambulatory Visit | Attending: Internal Medicine | Admitting: Internal Medicine

## 2015-12-26 DIAGNOSIS — M25552 Pain in left hip: Secondary | ICD-10-CM | POA: Insufficient documentation

## 2015-12-26 DIAGNOSIS — M79605 Pain in left leg: Secondary | ICD-10-CM | POA: Insufficient documentation

## 2015-12-26 DIAGNOSIS — Z79899 Other long term (current) drug therapy: Secondary | ICD-10-CM | POA: Insufficient documentation

## 2015-12-26 DIAGNOSIS — M549 Dorsalgia, unspecified: Secondary | ICD-10-CM | POA: Diagnosis not present

## 2015-12-26 DIAGNOSIS — M79604 Pain in right leg: Secondary | ICD-10-CM | POA: Insufficient documentation

## 2015-12-26 DIAGNOSIS — M25551 Pain in right hip: Secondary | ICD-10-CM | POA: Diagnosis not present

## 2015-12-26 DIAGNOSIS — D5700 Hb-SS disease with crisis, unspecified: Secondary | ICD-10-CM | POA: Insufficient documentation

## 2015-12-26 LAB — CBC WITH DIFFERENTIAL/PLATELET
BASOS PCT: 1 %
Basophils Absolute: 0.1 10*3/uL (ref 0.0–0.1)
EOS ABS: 0.1 10*3/uL (ref 0.0–0.7)
Eosinophils Relative: 1 %
HCT: 26.8 % — ABNORMAL LOW (ref 36.0–46.0)
HEMOGLOBIN: 9.1 g/dL — AB (ref 12.0–15.0)
Lymphocytes Relative: 25 %
Lymphs Abs: 3.3 10*3/uL (ref 0.7–4.0)
MCH: 32.6 pg (ref 26.0–34.0)
MCHC: 34 g/dL (ref 30.0–36.0)
MCV: 96.1 fL (ref 78.0–100.0)
MONO ABS: 1.2 10*3/uL — AB (ref 0.1–1.0)
Monocytes Relative: 9 %
NEUTROS ABS: 8.4 10*3/uL — AB (ref 1.7–7.7)
Neutrophils Relative %: 64 %
PLATELETS: 343 10*3/uL (ref 150–400)
RBC: 2.79 MIL/uL — ABNORMAL LOW (ref 3.87–5.11)
RDW: 20.1 % — ABNORMAL HIGH (ref 11.5–15.5)
WBC: 13.1 10*3/uL — ABNORMAL HIGH (ref 4.0–10.5)

## 2015-12-26 LAB — RETICULOCYTES
RBC.: 2.79 MIL/uL — ABNORMAL LOW (ref 3.87–5.11)
RETIC CT PCT: 14.3 % — AB (ref 0.4–3.1)
Retic Count, Absolute: 399 10*3/uL — ABNORMAL HIGH (ref 19.0–186.0)

## 2015-12-26 MED ORDER — SENNOSIDES-DOCUSATE SODIUM 8.6-50 MG PO TABS
1.0000 | ORAL_TABLET | Freq: Two times a day (BID) | ORAL | Status: DC
  Filled 2015-12-26: qty 1

## 2015-12-26 MED ORDER — KETOROLAC TROMETHAMINE 30 MG/ML IJ SOLN
30.0000 mg | Freq: Four times a day (QID) | INTRAMUSCULAR | Status: DC
  Administered 2015-12-26: 30 mg via INTRAVENOUS
  Filled 2015-12-26: qty 1

## 2015-12-26 MED ORDER — PROMETHAZINE HCL 12.5 MG RE SUPP: 12.5000 mg | RECTAL | Status: DC | PRN

## 2015-12-26 MED ORDER — HYDROMORPHONE HCL 2 MG/ML IJ SOLN
1.0000 mg | INTRAMUSCULAR | Status: DC | PRN
  Administered 2015-12-26 (×2): 1 mg via INTRAVENOUS
  Filled 2015-12-26 (×2): qty 1

## 2015-12-26 MED ORDER — HEPARIN SOD (PORK) LOCK FLUSH 100 UNIT/ML IV SOLN
250.0000 [IU] | INTRAVENOUS | Status: AC | PRN
  Administered 2015-12-26: 250 [IU]
  Filled 2015-12-26: qty 5

## 2015-12-26 MED ORDER — DIPHENHYDRAMINE HCL 25 MG PO CAPS: 25.0000 mg | ORAL_CAPSULE | ORAL | Status: DC | PRN

## 2015-12-26 MED ORDER — DEXTROSE-NACL 5-0.45 % IV SOLN
INTRAVENOUS | Status: DC
  Administered 2015-12-26: 10:00:00 via INTRAVENOUS

## 2015-12-26 MED ORDER — MORPHINE SULFATE ER 30 MG PO TBCR: 60.0000 mg | EXTENDED_RELEASE_TABLET | Freq: Once | ORAL | Status: DC

## 2015-12-26 MED ORDER — MORPHINE SULFATE ER 30 MG PO TBCR
30.0000 mg | EXTENDED_RELEASE_TABLET | Freq: Once | ORAL | Status: AC
  Administered 2015-12-26: 30 mg via ORAL
  Filled 2015-12-26: qty 1

## 2015-12-26 MED ORDER — SODIUM CHLORIDE 0.9 % IV SOLN: 25.0000 mg | INTRAVENOUS | Status: DC | PRN

## 2015-12-26 MED ORDER — HYDROXYUREA 500 MG PO CAPS: 1000.0000 mg | ORAL_CAPSULE | Freq: Once | ORAL | Status: DC

## 2015-12-26 MED ORDER — HYDROMORPHONE HCL 2 MG/ML IJ SOLN
2.0000 mg | INTRAMUSCULAR | Status: DC | PRN
  Administered 2015-12-26 (×2): 2 mg via INTRAVENOUS
  Filled 2015-12-26 (×2): qty 1

## 2015-12-26 MED ORDER — PROMETHAZINE HCL 25 MG PO TABS
12.5000 mg | ORAL_TABLET | ORAL | Status: DC | PRN
  Administered 2015-12-26: 25 mg via ORAL
  Filled 2015-12-26: qty 1

## 2015-12-26 MED ORDER — POLYETHYLENE GLYCOL 3350 17 G PO PACK: 17.0000 g | PACK | Freq: Every day | ORAL | Status: DC | PRN

## 2015-12-26 MED ORDER — SODIUM CHLORIDE 0.9% FLUSH: 10.0000 mL | INTRAVENOUS | Status: DC | PRN

## 2015-12-26 NOTE — Discharge Instructions (Signed)
Sickle Cell Anemia, Adult Sickle cell anemia is a condition where your red blood cells are shaped like sickles. Red blood cells carry oxygen through the body. Sickle-shaped red blood cells do not live as long as normal red blood cells. They also clump together and block blood from flowing through the blood vessels. These things prevent the body from getting enough oxygen. Sickle cell anemia causes organ damage and pain. It also increases the risk of infection. HOME CARE  Drink enough fluid to keep your pee (urine) clear or pale yellow. Drink more in hot weather and during exercise.  Do not smoke. Smoking lowers oxygen levels in the blood.  Only take over-the-counter or prescription medicines as told by your doctor.  Take antibiotic medicines as told by your doctor. Make sure you finish them even if you start to feel better.  Take supplements as told by your doctor.  Consider wearing a medical alert bracelet. This tells anyone caring for you in an emergency of your condition.  When traveling, keep your medical information, doctors' names, and the medicines you take with you at all times.  If you have a fever, do not take fever medicines right away. This could cover up a problem. Tell your doctor.   Keep all follow-up visits with your doctor. Sickle cell anemia requires regular medical care. GET HELP IF: You have a fever. GET HELP RIGHT AWAY IF:  You feel dizzy or faint.  You have new belly (abdominal) pain, especially on the left side near the stomach area.  You have a lasting, often uncomfortable and painful erection of the penis (priapism). If it is not treated right away, you will become unable to have sex (impotence).  You have numbness in your arms or legs or you have a hard time moving them.  You have a hard time talking.  You have a fever or lasting symptoms for more than 2-3 days.  You have a fever and your symptoms suddenly get worse.  You have signs or symptoms of  infection. These include:  Chills.  Being more tired than normal (lethargy).  Irritability.  Poor eating.  Throwing up (vomiting).  You have pain that is not helped with medicine.  You have shortness of breath.  You have pain in your chest.  You are coughing up pus-like or bloody mucus.  You have a stiff neck.  Your feet or hands swell or have pain.  Your belly looks bloated.  Your joints hurt. MAKE SURE YOU:  Understand these instructions.  Will watch your condition.  Will get help right away if you are not doing well or get worse.   This information is not intended to replace advice given to you by your health care provider. Make sure you discuss any questions you have with your health care provider.   Document Released: 12/07/2012 Document Revised: 07/03/2014 Document Reviewed: 12/07/2012 Elsevier Interactive Patient Education 2016 Else[employer] Hydroxyurea capsules What is this medicine? HYDROXYUREA (hye drox ee yoor EE a) is a chemotherapy drug. This medicine is used to treat certain types of leukemias and head and neck cancer. It is also used to control the painful crises of sickle cell anemia. This medicine may be used for other purposes; ask your health care provider or pharmacist if you have questions. What should I tell my health care provider before I take this medicine? They need to know if you have any of these conditions: -HIV or AIDS -kidney disease or on hemodialysis -liver disease -low  blood counts, like low white cell, platelet, or red cell counts -prior or current interferon therapy -recent or ongoing radiation therapy -an unusual or allergic reaction to hydroxyurea, other chemotherapy, other medicines, foods, dyes, or preservatives -pregnant or trying to get pregnant -breast-feeding How should I use this medicine? Take this medicine by mouth with a glass of water. Follow the directions on the prescription label. Take your medicine at regular  intervals. Do not take it more often than directed. Do not stop taking except on your doctor's advice. People who are not taking this medicine should not be exposed to it. Wash your hands before and after handling your bottle or medicine. Caregivers should wear disposable gloves if they must touch the bottle or medicine. Clean up any medicine powder that spills with a damp disposable towel and throw the towel away in a closed container, such as a plastic bag. Talk to your pediatrician regarding the use of this medicine in children. Special care may be needed. Overdosage: If you think you have taken too much of this medicine contact a poison control center or emergency room at once. NOTE: This medicine is only for you. Do not share this medicine with others. What if I miss a dose? If you miss a dose, take it as soon as you can. If it is almost time for your next dose, take only that dose. Do not take double or extra doses. What may interact with this medicine? This medicine may also interact with the following medications: -didanosine -stavudine -live virus vaccines This list may not describe all possible interactions. Give your health care provider a list of all the medicines, herbs, non-prescription drugs, or dietary supplements you use. Also tell them if you smoke, drink alcohol, or use illegal drugs. Some items may interact with your medicine. What should I watch for while using this medicine? This drug may make you feel generally unwell. This is not uncommon, as chemotherapy can affect healthy cells as well as cancer cells. Report any side effects. Continue your course of treatment even though you feel ill unless your doctor tells you to stop. You will receive regular blood tests during your treatment. Call your doctor or health care professional for advice if you get a fever, chills or sore throat, or other symptoms of a cold or flu. Do not treat yourself. This drug decreases your body's ability  to fight infections. Try to avoid being around people who are sick. This medicine may increase your risk to bruise or bleed. Call your doctor or health care professional if you notice any unusual bleeding. Talk to your doctor about your risk of cancer. You may be more at risk for certain types of cancers if you take this medicine. You may need blood work done while you are taking this medicine. Do not become pregnant while taking this medicine or for at least 6 months after stopping it. Women should inform their doctor if they wish to become pregnant or think they might be pregnant. Men should not father a child while taking this medicine and for at least a year after stopping it. There is a potential for serious side effects to an unborn child. Talk to your health care professional or pharmacist for more information. Do not breast-feed an infant while taking this medicine. This may interfere with the ability to have or father a child. You should talk with your doctor or health care professional if you are concerned about your fertility. What side effects may I  notice from receiving this medicine? Side effects that you should report to your doctor or health care professional as soon as possible: -allergic reactions like skin rash, itching or hives, swelling of the face, lips, or tongue -low blood counts - this medicine may decrease the number of white blood cells, red blood cells and platelets. You may be at increased risk for infections and bleeding. -signs of infection - fever or chills, cough, sore throat, pain or difficulty passing urine -signs of decreased platelets or bleeding - bruising, pinpoint red spots on the skin, black, tarry stools, blood in the urine -signs of decreased red blood cells - unusually weak or tired, fainting spells, lightheadedness -breathing problems -burning, redness or pain at the site of any radiation therapy -changes in skin color -confusion -mouth sores -pain,  tingling, numbness in the hands or feet -seizures -skin ulcers -trouble passing urine or change in the amount of urine -vomiting Side effects that usually do not require medical attention (report to your doctor or health care professional if they continue or are bothersome): -headache -loss of appetite -red color to the face This list may not describe all possible side effects. Call your doctor for medical advice about side effects. You may report side effects to FDA at 1-800-FDA-1088. Where should I keep my medicine? Keep out of the reach of children. See product for storage instructions. Each product may have different instructions. Keep tightly closed. Throw away any unused medicine after the expiration date. NOTE: This sheet is a summary. It may not cover all possible information. If you have questions about this medicine, talk to your doctor, pharmacist, or health care provider.    2016, Elsevier/Gold Standard. (2014-06-01 16:51:41)

## 2015-12-26 NOTE — Progress Notes (Addendum)
Pt discharged to home; discharged instuctions explained, given, and signed; all questions answered; pt alert and oriented; [facility]; no complications noted

## 2015-12-26 NOTE — Discharge Summary (Signed)
Physician Discharge Summary  Brittany CampbellMiranda Foster BJY:[id number] DOB: 09/22/1985 DOA: 12/26/2015  PCP: Jeanann LewandowskyJEGEDE, Bobbyjoe Pabst, MD  Admit date: 12/26/2015  Discharge date: 12/26/2015  Time spent: 30 minutes  Discharge Diagnoses:  Active Problems:   Sickle cell anemia with pain [facility](HCC)   Discharge Condition: Stable  Diet recommendation: Regular  Filed Weights   12/26/15 0823  Weight: 125 lb (56.7 kg)   History of present illness:  Brittany CampbellMiranda Foster is a 30 y.o. female with history of sickle cell disease, opiate tolerant and possibly dependent whopresented to the day hospital today with complaint of pain in her back, hip joints and legs consistent with her sickle cell pain crisis. She had called 3 days in a row, first day she said she would not come op the Day Hospital if she will not be placed on PCA and IV dilaudid. Yesterday she did not have a ride to the day hospital, then she made a ride arrangement for this morning.Patient is not adherent with her home medications including hydroxyurea, folic acid and even pain medications. She however denies any suicidal thoughts or ideation. She rated her pain at 8 out of 10 today, she denies any fever, no urinary symptoms, no headache, no shortness of breath, no chest pain, no nausea, vomiting or diarrhea. No joint swelling or redness.  Hospital Course:  Brittany CampbellMiranda Foster was admitted to the day hospital with sickle cell pain. Patient was treated with IV Dilaudid per her individualized care plan, IV Toradol as well as IV fluids and other adjunct therapies. Alleyne was not satisfied about her care plan saying "I don't want to wait for pain medication every 3 hours" "What I my suppose to do seating here for 3 hours before I get medication IV" "That is a BS treatment plan, not for me". She did not show any sign or symptom that's worrisome, no nausea or vomiting but she claimed she was vomiting the past 2 days and for that reason she needed IV benadryl and phenergan. He pain  improved somewhat at the time of discharge. Patient was discharged home in a hemodynamically stable condition. Brittany Foster will follow-up at the clinic as previously scheduled, continue with home medications as per prior to admission.  Discharge Instructions We discussed the need for good hydration, monitoring of hydration status, avoidance of heat, cold, stress, and infection triggers. We discussed the need to be compliant with taking Hydrea. Brittany Foster was reminded of the need to seek medical attention of any symptoms of bleeding, anemia, or infection occurs.  Discharge Exam: Vitals:   12/26/15 0823 12/26/15 1018  BP: 102/65 106/70  Pulse: 91 83  Resp: 18 18  Temp: 98.3 F (36.8 C)     General appearance: alert, cooperative and no distress Eyes: conjunctivae/corneas clear. PERRL, EOM's intact. Fundi benign. Neck: no adenopathy, no carotid bruit, no JVD, supple, symmetrical, trachea midline and thyroid not enlarged, symmetric, no tenderness/mass/nodules Back: symmetric, no curvature. ROM normal. No CVA tenderness. Resp: clear to auscultation bilaterally Chest wall: no tenderness Cardio: regular rate and rhythm, S1, S2 normal, no murmur, click, rub or gallop GI: soft, non-tender; bowel sounds normal; no masses, no organomegaly Extremities: extremities normal, atraumatic, no cyanosis or edema Pulses: 2+ and symmetric Skin: Skin color, texture, turgor normal. No rashes or lesions Neurologic: Grossly normal   Discharge Medication List as of 12/26/2015  1:40 PM    CONTINUE these medications which have NOT CHANGED   Details  DULoxetine (CYMBALTA) 60 MG capsule TAKE 1 CAPSULE BY MOUTH DAILY., Normal  folic acid (FOLVITE) 1 MG tablet Take 1 tablet (1 mg total) by mouth daily., Starting 08/08/2015, Until Discontinued, Normal    hydroxyurea (HYDREA) 500 MG capsule TAKE 2 CAPSULES BY MOUTH DAILY. MAY TAKE WITH FOOD TO MINIMIZE GI SIDE EFFECTS., Normal    morphine (MS CONTIN) 30 MG 12 hr tablet  Take 1 tablet (30 mg total) by mouth every 12 (twelve) hours., Starting Thu 11/28/2015, Print    oxyCODONE-acetaminophen (PERCOCET) 10-325 MG tablet Take 1 tablet by mouth every 4 (four) hours as needed for pain., Starting Wed 12/11/2015, Until Thu 12/26/2015, Print    Topiramate ER 100 MG CP24 Take 100 mg by mouth at bedtime., Until Discontinued, Historical Med    gabapentin (NEURONTIN) 300 MG capsule TAKE 1 CAPSULE BY MOUTH 3 TIMES DAILY., Normal    ibuprofen (ADVIL,MOTRIN) 200 MG tablet Take 400 mg by mouth every 6 (six) hours as needed for moderate pain. , Until Discontinued, Historical Med    potassium chloride SA (K-DUR,KLOR-CON) 20 MEQ tablet Take 2 tablets (40 mEq total) by mouth daily., Starting Sat 11/23/2015, Normal       Allergies  Allergen Reactions  . Ultram [Tramadol] Other (See Comments)    seizures  . Zofran [Ondansetron Hcl] Nausea And Vomiting  . Buprenorphine Hcl Hives and Rash    Shaking Tolerates Percocet, Norco, and buprenorphine  . Morphine And Related Hives, Rash and Other (See Comments)    Shaking Tolerates Percocet, Norco, Dilaudid, and buprenorphine  . Tape Rash    Significant Diagnostic Studies: No results found.  Signed:  Jeanann Lewandowsky MD, MHA, FACP, FAAP, CPE   12/26/2015, 5:58 PM

## 2015-12-26 NOTE — Progress Notes (Signed)
Wants to talk to Dr. Hyman HopesJegede. Dr. Hyman HopesJegede notified. Let patient know that Hydrea is ordered. Patient says..."I can't swallow all these pills cause I am vomiting." Patient has not vomited since she has been here, and I reminded patient that she did take the Phenergan by mouth for complaints of nausea.

## 2015-12-26 NOTE — Progress Notes (Signed)
[facility] 8/10 pain in legs, back and upper abdomen. Alert, oriented and smiling. Gait steady. Complained of some vomiting last night, none this morning. Dr. Hyman HopesJegede notified of patient's arrival.

## 2015-12-26 NOTE — H&P (Signed)
[facility] History and Physical  Brittany Foster ZOX:[id number] DOB: 08/19/1985 DOA: 12/26/2015  PCP: Jeanann Lewandowsky, MD   Chief Complaint: Pain  HPI: Brittany Foster is a 30 y.o. female with history of sickle cell disease, opiate tolerant and possibly dependent whopresented to the day hospital today with complaint of pain in her back, hip joints and legs consistent with her sickle cell pain crisis. She had called 3 days in a row, first day she said she would not come op the Day Hospital if she will not be placed on PCA and IV dilaudid. Yesterday she did not have a ride to the day hospital, then she made a ride arrangement for this morning.Patient is not adherent with her home medications including hydroxyurea, folic acid and even pain medications. She however denies any suicidal thoughts or ideation. She rated her pain at 8 out of 10 today, she denies any fever, no urinary symptoms, no headache, no shortness of breath, no chest pain, no nausea, vomiting or diarrhea. No joint swelling or redness.  Systemic Review: General: The patient denies anorexia, fever, weight loss Cardiac: Denies chest pain, syncope, palpitations, pedal edema  Respiratory: Denies cough, shortness of breath, wheezing GI: Denies severe indigestion/heartburn, abdominal pain, nausea, vomiting, diarrhea and constipation GU: Denies hematuria, incontinence, dysuria  Musculoskeletal: Denies arthritis  Skin: Denies suspicious skin lesions Neurologic: Denies focal weakness or numbness, change in vision  Past Medical History:  Diagnosis Date  . Anemia   . Depression, major, recurrent (HCC)   . Migraines   . Sickle cell anemia (HCC)     Past Surgical History:  Procedure Laterality Date  . CESAREAN SECTION    . CHOLECYSTECTOMY  2000  . IR GENERIC HISTORICAL  10/08/2015   IR US GUIDE VASC ACCESS RIGHT 10/08/2015 Simonne Come, MD [facility]  . IR GENERIC HISTORICAL  10/08/2015   IR FLUORO GUIDE CV LINE RIGHT  10/08/2015 Simonne Come, MD [facility]  . MULTIPLE TOOTH EXTRACTIONS N/A   . port a cath placement Right    about 6-7 years ago  . removal of porta cath Right 09/11/15  . TUBAL LIGATION      Allergies  Allergen Reactions  . Ultram [Tramadol] Other (See Comments)    seizures  . Zofran [Ondansetron Hcl] Nausea And Vomiting  . Buprenorphine Hcl Hives and Rash    Shaking Tolerates Percocet, Norco, and buprenorphine  . Morphine And Related Hives, Rash and Other (See Comments)    Shaking Tolerates Percocet, Norco, Dilaudid, and buprenorphine  . Tape Rash    Family History  Problem Relation Age of Onset  . Sickle cell trait Father   . Sickle cell trait Mother   . Sickle cell anemia Other     Prior to Admission medications   Medication Sig Start Date End Date Taking? Authorizing Provider  DULoxetine (CYMBALTA) 60 MG capsule TAKE 1 CAPSULE BY MOUTH DAILY. 11/27/15  Yes Quentin Angst, MD  folic acid (FOLVITE) 1 MG tablet Take 1 tablet (1 mg total) by mouth daily. 08/08/15  Yes [name], FNP  hydroxyurea (HYDREA) 500 MG capsule TAKE 2 CAPSULES BY MOUTH DAILY. MAY TAKE WITH FOOD TO MINIMIZE GI SIDE EFFECTS. Patient taking differently: Take 500 mg by mouth daily. MAY TAKE WITH FOOD TO MINIMIZE GI SIDE EFFECTS. 08/08/15  Yes [name], FNP  morphine (MS CONTIN) 30 MG 12 hr tablet Take 1 tablet (30 mg total) by mouth every 12 (twelve) hours. 11/28/15  Yes Sheddrick Lattanzio E  Hyman HopesJegede, MD  oxyCODONE-acetaminophen (PERCOCET) 10-325 MG tablet Take 1 tablet by mouth every 4 (four) hours as needed for pain. 12/11/15 12/26/15 Yes Quentin Angstlugbemiga E Doyce Stonehouse, MD  Topiramate ER 100 MG CP24 Take 100 mg by mouth at bedtime.   Yes Historical Provider, MD  gabapentin (NEURONTIN) 300 MG capsule TAKE 1 CAPSULE BY MOUTH 3 TIMES DAILY. 11/27/15   Quentin Angstlugbemiga E Emmitte Surgeon, MD  ibuprofen (ADVIL,MOTRIN) 200 MG tablet Take 400 mg by mouth every 6 (six) hours as needed for moderate pain.     Historical Provider, MD   potassium chloride SA (K-DUR,KLOR-CON) 20 MEQ tablet Take 2 tablets (40 mEq total) by mouth daily. 11/23/15   Altha HarmMichelle A [name], MD   Physical Exam: Vitals:   12/26/15 0823  BP: 102/65  Pulse: 91  Resp: 18  Temp: 98.3 F (36.8 C)  TempSrc: Oral  SpO2: 99%  Weight: 125 lb (56.7 kg)  Height: 5' (1.524 m)   General: Alert, awake, afebrile, anicteric, not in obvious distress HEENT: Normocephalic and Atraumatic, Mucous membranes pink                PERRLA; EOM intact; No scleral icterus,                 Nares: Patent, Oropharynx: Clear, Fair Dentition                 Neck: FROM, no cervical lymphadenopathy, thyromegaly, carotid bruit or JVD;  CHEST WALL: No tenderness  CHEST: Normal respiration, clear to auscultation bilaterally  HEART: Regular rate and rhythm; no murmurs rubs or gallops  BACK: No kyphosis or scoliosis; no CVA tenderness  ABDOMEN: Positive Bowel Sounds, soft, non-tender; no masses, no organomegaly EXTREMITIES: No cyanosis, clubbing, or edema SKIN:  no rash or ulceration  CNS: Alert and Oriented x 4, Nonfocal exam, CN 2-12 intact  Labs on Admission:  Basic Metabolic Panel: No results for input(s): NA, K, CL, CO2, GLUCOSE, BUN, CREATININE, CALCIUM, MG, PHOS in the last 168 hours. Liver Function Tests: No results for input(s): AST, ALT, ALKPHOS, BILITOT, PROT, ALBUMIN in the last 168 hours. No results for input(s): LIPASE, AMYLASE in the last 168 hours. No results for input(s): AMMONIA in the last 168 hours. CBC: No results for input(s): WBC, NEUTROABS, HGB, HCT, MCV, PLT in the last 168 hours. Cardiac Enzymes: No results for input(s): CKTOTAL, CKMB, CKMBINDEX, TROPONINI in the last 168 hours.  BNP (last 3 results) No results for input(s): BNP in the last 8760 hours.  ProBNP (last 3 results) No results for input(s): PROBNP in the last 8760 hours.  CBG: No results for input(s): GLUCAP in the last 168 hours.   Assessment/Plan Active Problems:   Sickle  cell anemia with pain (HCC)   Admits to the Day Hospital  IVF D5 .45% Saline @ 125 mls/hour  Individualized care plan as documented in FYI  IV Toradol 30 mg Q 6 H  Monitor vitals very closely, Re-evaluate pain scale every hour  2 L of Oxygen by [place]  Patient will be re-evaluated for pain in the context of function and relationship to baseline as care progresses.  If no significant relieve from pain (remains above 5/10) will transfer patient to inpatient services for further evaluation and management  Code Status: Full  Family Communication: None  DVT Prophylaxis: Ambulate as tolerated  Time spent: 35 Minutes  Edu On, MD, MHA, FACP, FAAP, CPE  If 7PM-7AM, please contact night-coverage [email or web address] 12/26/2015, 9:12 AM

## 2015-12-30 ENCOUNTER — Telehealth (HOSPITAL_COMMUNITY): Payer: Self-pay | Admitting: *Deleted

## 2015-12-30 ENCOUNTER — Telehealth: Payer: Self-pay

## 2015-12-30 NOTE — Telephone Encounter (Signed)
[facility]. Pt states her pain is 8/10 and it is in her legs, stomach and back. States her pain is upper abd. She had her last pain med at 6am this morning of MS contin and OxyContin. Denies fever, chest pain, vomiting, diarrhea but has some nausea. Will check with the provider and give her a call back.

## 2015-12-30 NOTE — Telephone Encounter (Signed)
Pt was called after speaking with Dr. Hyman HopesJegede that she can come in for treatment. Pt verbalized understanding and stated that she would  call the [facility] to come and get her.

## 2015-12-31 ENCOUNTER — Encounter (HOSPITAL_COMMUNITY): Payer: Self-pay | Admitting: *Deleted

## 2015-12-31 ENCOUNTER — Non-Acute Institutional Stay (HOSPITAL_COMMUNITY)
Admission: AD | Admit: 2015-12-31 | Discharge: 2015-12-31 | Disposition: A | Discharge status: Home / Self Care | Payer: Medicaid Other | Source: Ambulatory Visit | Attending: Internal Medicine | Admitting: Internal Medicine

## 2015-12-31 ENCOUNTER — Other Ambulatory Visit: Payer: Self-pay | Admitting: Internal Medicine

## 2015-12-31 ENCOUNTER — Telehealth (HOSPITAL_COMMUNITY): Payer: Self-pay | Admitting: *Deleted

## 2015-12-31 DIAGNOSIS — D5700 Hb-SS disease with crisis, unspecified: Secondary | ICD-10-CM | POA: Diagnosis not present

## 2015-12-31 DIAGNOSIS — G894 Chronic pain syndrome: Secondary | ICD-10-CM | POA: Insufficient documentation

## 2015-12-31 DIAGNOSIS — M25559 Pain in unspecified hip: Secondary | ICD-10-CM | POA: Diagnosis not present

## 2015-12-31 DIAGNOSIS — M549 Dorsalgia, unspecified: Secondary | ICD-10-CM | POA: Diagnosis not present

## 2015-12-31 DIAGNOSIS — D571 Sickle-cell disease without crisis: Secondary | ICD-10-CM

## 2015-12-31 DIAGNOSIS — M79669 Pain in unspecified lower leg: Secondary | ICD-10-CM | POA: Insufficient documentation

## 2015-12-31 LAB — COMPREHENSIVE METABOLIC PANEL
ALBUMIN: 4 g/dL (ref 3.5–5.0)
ALT: 35 U/L (ref 14–54)
AST: 52 U/L — AB (ref 15–41)
Alkaline Phosphatase: 72 U/L (ref 38–126)
Anion gap: 6 (ref 5–15)
BUN: 7 mg/dL (ref 6–20)
CHLORIDE: 109 mmol/L (ref 101–111)
CO2: 23 mmol/L (ref 22–32)
Calcium: 8.6 mg/dL — ABNORMAL LOW (ref 8.9–10.3)
Creatinine, Ser: 0.36 mg/dL — ABNORMAL LOW (ref 0.44–1.00)
GFR calc Af Amer: >60 mL/min (ref >60)
GFR calc non Af Amer: >60 mL/min (ref >60)
GLUCOSE: 114 mg/dL — AB (ref 65–99)
POTASSIUM: 2.9 mmol/L — AB (ref 3.5–5.1)
SODIUM: 138 mmol/L (ref 135–145)
Total Bilirubin: 2.8 mg/dL — ABNORMAL HIGH (ref 0.3–1.2)
Total Protein: 7 g/dL (ref 6.5–8.1)

## 2015-12-31 LAB — CBC WITH DIFFERENTIAL/PLATELET
Basophils Absolute: 0.1 10*3/uL (ref 0.0–0.1)
Basophils Relative: 1 %
EOS PCT: 1 %
Eosinophils Absolute: 0.1 10*3/uL (ref 0.0–0.7)
HCT: 22 % — ABNORMAL LOW (ref 36.0–46.0)
Hemoglobin: 7.7 g/dL — ABNORMAL LOW (ref 12.0–15.0)
LYMPHS ABS: 3.4 10*3/uL (ref 0.7–4.0)
LYMPHS PCT: 32 %
MCH: 32.6 pg (ref 26.0–34.0)
MCHC: 35 g/dL (ref 30.0–36.0)
MCV: 93.2 fL (ref 78.0–100.0)
MONO ABS: 1.3 10*3/uL — AB (ref 0.1–1.0)
MONOS PCT: 13 %
Neutro Abs: 5.7 10*3/uL (ref 1.7–7.7)
Neutrophils Relative %: 53 %
PLATELETS: 258 10*3/uL (ref 150–400)
RBC: 2.36 MIL/uL — AB (ref 3.87–5.11)
RDW: 20.7 % — ABNORMAL HIGH (ref 11.5–15.5)
WBC: 10.5 10*3/uL (ref 4.0–10.5)

## 2015-12-31 LAB — RAPID URINE DRUG SCREEN, HOSP PERFORMED
AMPHETAMINES: NOT DETECTED
Barbiturates: NOT DETECTED
Benzodiazepines: NOT DETECTED
COCAINE: NOT DETECTED
OPIATES: POSITIVE — AB
TETRAHYDROCANNABINOL: NOT DETECTED

## 2015-12-31 LAB — URINALYSIS, ROUTINE W REFLEX MICROSCOPIC
Bilirubin Urine: NEGATIVE
Glucose, UA: NEGATIVE mg/dL
HGB URINE DIPSTICK: NEGATIVE
Ketones, ur: NEGATIVE mg/dL
Leukocytes, UA: NEGATIVE
Nitrite: NEGATIVE
Protein, ur: NEGATIVE mg/dL
SPECIFIC GRAVITY, URINE: 1.013 (ref 1.005–1.030)
pH: 6 (ref 5.0–8.0)

## 2015-12-31 LAB — RETICULOCYTES
RBC.: 2.36 MIL/uL — ABNORMAL LOW (ref 3.87–5.11)
Retic Count, Absolute: 460.2 10*3/uL — ABNORMAL HIGH (ref 19.0–186.0)
Retic Ct Pct: 19.5 % — ABNORMAL HIGH (ref 0.4–3.1)

## 2015-12-31 MED ORDER — SODIUM CHLORIDE 0.9% FLUSH
10.0000 mL | INTRAVENOUS | Status: AC | PRN
  Administered 2015-12-31: 10 mL

## 2015-12-31 MED ORDER — PROMETHAZINE HCL 12.5 MG RE SUPP
12.5000 mg | RECTAL | Status: DC | PRN
  Filled 2015-12-31: qty 2

## 2015-12-31 MED ORDER — HYDROMORPHONE HCL 2 MG/ML IJ SOLN
2.0000 mg | INTRAMUSCULAR | Status: DC | PRN
  Administered 2015-12-31 (×4): 2 mg via INTRAVENOUS
  Filled 2015-12-31 (×4): qty 1

## 2015-12-31 MED ORDER — POLYETHYLENE GLYCOL 3350 17 G PO PACK: 17.0000 g | PACK | Freq: Every day | ORAL | Status: DC | PRN

## 2015-12-31 MED ORDER — MORPHINE SULFATE ER 30 MG PO TBCR
60.0000 mg | EXTENDED_RELEASE_TABLET | Freq: Once | ORAL | Status: AC
  Administered 2015-12-31: 60 mg via ORAL
  Filled 2015-12-31: qty 2

## 2015-12-31 MED ORDER — MORPHINE SULFATE ER 30 MG PO TBCR: 30.0000 mg | EXTENDED_RELEASE_TABLET | Freq: Two times a day (BID) | ORAL | 0 refills | Status: DC

## 2015-12-31 MED ORDER — POTASSIUM CHLORIDE CRYS ER 20 MEQ PO TBCR
40.0000 meq | EXTENDED_RELEASE_TABLET | Freq: Once | ORAL | Status: AC
  Administered 2015-12-31: 40 meq via ORAL
  Filled 2015-12-31: qty 2

## 2015-12-31 MED ORDER — OXYCODONE-ACETAMINOPHEN 10-325 MG PO TABS: 1.0000 | ORAL_TABLET | ORAL | 0 refills | Status: DC | PRN

## 2015-12-31 MED ORDER — PROMETHAZINE HCL 25 MG PO TABS
12.5000 mg | ORAL_TABLET | ORAL | Status: DC | PRN
  Administered 2015-12-31: 25 mg via ORAL
  Filled 2015-12-31: qty 1

## 2015-12-31 MED ORDER — DEXTROSE-NACL 5-0.45 % IV SOLN
INTRAVENOUS | Status: DC
  Administered 2015-12-31: 11:00:00 via INTRAVENOUS

## 2015-12-31 MED ORDER — HYDROMORPHONE HCL 2 MG/ML IJ SOLN
2.0000 mg | Freq: Once | INTRAMUSCULAR | Status: AC
  Administered 2015-12-31: 2 mg via INTRAVENOUS
  Filled 2015-12-31: qty 1

## 2015-12-31 MED ORDER — HEPARIN SOD (PORK) LOCK FLUSH 100 UNIT/ML IV SOLN
250.0000 [IU] | INTRAVENOUS | Status: AC | PRN
  Administered 2015-12-31: 250 [IU]
  Filled 2015-12-31: qty 5

## 2015-12-31 MED ORDER — SENNOSIDES-DOCUSATE SODIUM 8.6-50 MG PO TABS
1.0000 | ORAL_TABLET | Freq: Two times a day (BID) | ORAL | Status: DC
Start: 2015-12-31 — End: 2015-12-31

## 2015-12-31 MED ORDER — KETOROLAC TROMETHAMINE 30 MG/ML IJ SOLN
30.0000 mg | Freq: Four times a day (QID) | INTRAMUSCULAR | Status: DC
  Administered 2015-12-31: 30 mg via INTRAVENOUS
  Filled 2015-12-31: qty 1

## 2015-12-31 NOTE — H&P (Signed)
[facility] History and Physical  Brittany CampbellMiranda Dubach ZOX:[id number] DOB: 11/05/1985 DOA: 12/31/2015  PCP: Jeanann LewandowskyJEGEDE, Posie Lillibridge, MD   Chief Complaint: General body pain  HPI: Brittany Foster is a 30 y.o. female with history of sickle cell disease, very opiate tolerant and possibly dependent whopresented to the day hospital today with complaint of pain in her back, hip joints and legs consistent with her sickle cell pain crisis. She had called yesterday but she did not have a ride to the day hospital, then she made a ride arrangement for this morning.Patient is not adherent with her home medications including hydroxyurea, folic acid and even pain medications.She however denies any suicidal thoughts or ideation. She rated her pain at 8 out of 10 today, she denies any fever, no urinary symptoms, no headache, no shortness of breath, no chest pain, no nausea, vomiting or diarrhea. No joint swelling or redness.  Systemic Review: General: The patient denies anorexia, fever, weight loss Cardiac: Denies chest pain, syncope, palpitations, pedal edema  Respiratory: Denies cough, shortness of breath, wheezing GI: Denies severe indigestion/heartburn, abdominal pain, nausea, vomiting, diarrhea and constipation GU: Denies hematuria, incontinence, dysuria  Musculoskeletal: Denies arthritis  Skin: Denies suspicious skin lesions Neurologic: Denies focal weakness or numbness, change in vision  Past Medical History:  Diagnosis Date  . Anemia   . Depression, major, recurrent (HCC)   . Migraines   . Sickle cell anemia (HCC)     Past Surgical History:  Procedure Laterality Date  . CESAREAN SECTION    . CHOLECYSTECTOMY  2000  . IR GENERIC HISTORICAL  10/08/2015   IR US GUIDE VASC ACCESS RIGHT 10/08/2015 Simonne ComeJohn Watts, MD [facility]  . IR GENERIC HISTORICAL  10/08/2015   IR FLUORO GUIDE CV LINE RIGHT 10/08/2015 Simonne ComeJohn Watts, MD [facility]  . MULTIPLE TOOTH EXTRACTIONS N/A   . port a cath placement Right     about 6-7 years ago  . removal of porta cath Right 09/11/15  . TUBAL LIGATION      Allergies  Allergen Reactions  . Ultram [Tramadol] Other (See Comments)    seizures  . Zofran [Ondansetron Hcl] Nausea And Vomiting  . Buprenorphine Hcl Hives and Rash    Shaking Tolerates Percocet, Norco, and buprenorphine  . Morphine And Related Hives, Rash and Other (See Comments)    Shaking Tolerates Percocet, Norco, Dilaudid, and buprenorphine  . Tape Rash    Family History  Problem Relation Age of Onset  . Sickle cell trait Father   . Sickle cell trait Mother   . Sickle cell anemia Other       Prior to Admission medications   Medication Sig Start Date End Date Taking? Authorizing Provider  DULoxetine (CYMBALTA) 60 MG capsule TAKE 1 CAPSULE BY MOUTH DAILY. 11/27/15  Yes Quentin Angstlugbemiga E Teruko Joswick, MD  folic acid (FOLVITE) 1 MG tablet Take 1 tablet (1 mg total) by mouth daily. 08/08/15  Yes Massie MaroonLachina M [name], FNP  hydroxyurea (HYDREA) 500 MG capsule TAKE 2 CAPSULES BY MOUTH DAILY. MAY TAKE WITH FOOD TO MINIMIZE GI SIDE EFFECTS. Patient taking differently: Take 500 mg by mouth daily. MAY TAKE WITH FOOD TO MINIMIZE GI SIDE EFFECTS. 08/08/15  Yes Massie MaroonLachina M [name], FNP  ibuprofen (ADVIL,MOTRIN) 200 MG tablet Take 400 mg by mouth every 6 (six) hours as needed for moderate pain.    Yes Historical Provider, MD  morphine (MS CONTIN) 30 MG 12 hr tablet Take 1 tablet (30 mg total) by mouth every 12 (twelve) hours. 11/28/15  Yes Quentin Angst, MD  Topiramate ER 100 MG CP24 Take 100 mg by mouth at bedtime.   Yes Historical Provider, MD  gabapentin (NEURONTIN) 300 MG capsule TAKE 1 CAPSULE BY MOUTH 3 TIMES DAILY. 11/27/15   Quentin Angst, MD  oxyCODONE-acetaminophen (PERCOCET) 10-325 MG tablet Take 1 tablet by mouth every 4 (four) hours as needed for pain. 12/31/15 01/15/16  Quentin Angst, MD  potassium chloride SA (K-DUR,KLOR-CON) 20 MEQ tablet Take 2 tablets (40 mEq total) by mouth daily. 11/23/15    Altha Harm, MD     Physical Exam: Vitals:   12/31/15 1009 12/31/15 1206  BP: 101/63 (!) 102/57  Pulse: 78 75  Resp: 20 16  Temp: 98.4 F (36.9 C) 98.5 F (36.9 C)  TempSrc: Oral Oral  SpO2: 100% 99%  Weight: 125 lb (56.7 kg)   Height: 5' (1.524 m)     General: Alert, awake, afebrile, anicteric, not in obvious distress HEENT: Normocephalic and Atraumatic, Mucous membranes pink                PERRLA; EOM intact; No scleral icterus,                 Nares: Patent, Oropharynx: Clear, Fair Dentition                 Neck: FROM, no cervical lymphadenopathy, thyromegaly, carotid bruit or JVD;  CHEST WALL: No tenderness  CHEST: Normal respiration, clear to auscultation bilaterally  HEART: Regular rate and rhythm; no murmurs rubs or gallops  BACK: No kyphosis or scoliosis; no CVA tenderness  ABDOMEN: Positive Bowel Sounds, soft, non-tender; no masses, no organomegaly EXTREMITIES: No cyanosis, clubbing, or edema SKIN:  no rash or ulceration  CNS: Alert and Oriented x 4, Nonfocal exam, CN 2-12 intact  Labs on Admission:  Basic Metabolic Panel:  Recent Labs Lab 12/31/15 1028  NA 138  K 2.9*  CL 109  CO2 23  GLUCOSE 114*  BUN 7  CREATININE 0.36*  CALCIUM 8.6*   Liver Function Tests:  Recent Labs Lab 12/31/15 1028  AST 52*  ALT 35  ALKPHOS 72  BILITOT 2.8*  PROT 7.0  ALBUMIN 4.0   No results for input(s): LIPASE, AMYLASE in the last 168 hours. No results for input(s): AMMONIA in the last 168 hours. CBC:  Recent Labs Lab 12/26/15 0935 12/31/15 1028  WBC 13.1* 10.5  NEUTROABS 8.4* 5.7  HGB 9.1* 7.7*  HCT 26.8* 22.0*  MCV 96.1 93.2  PLT 343 258   Cardiac Enzymes: No results for input(s): CKTOTAL, CKMB, CKMBINDEX, TROPONINI in the last 168 hours.  BNP (last 3 results) No results for input(s): BNP in the last 8760 hours.  ProBNP (last 3 results) No results for input(s): PROBNP in the last 8760 hours.  CBG: No results for input(s): GLUCAP in the  last 168 hours.   Assessment/Plan Active Problems:   Sickle cell anemia with pain (HCC)   Admits to the Day Hospital  IVF D5 .45% Saline @ 125 mls/hour  Dilaudid 2 mg Q2H prn started within 30 minutes of admission  IV Toradol 30 mg Q 6 H  Monitor vitals very closely, Re-evaluate pain scale every hour  2 L of Oxygen by [place]  Patient will be re-evaluated for pain in the context of function and relationship to baseline as care progresses.  If no significant relieve from pain (remains above 5/10) will transfer patient to inpatient services for further evaluation and management  Code Status:  Full  Family Communication: None  DVT Prophylaxis: Ambulate as tolerated   Time spent: 35 Minutes  Cincere Deprey, MD, MHA, FACP, FAAP, CPE  If 7PM-7AM, please contact night-coverage [email or web address] 12/31/2015, 12:54 PM

## 2015-12-31 NOTE — Telephone Encounter (Signed)
  Patient called to say that she could not get a ride here and wanted to know if she could come in the morning. RN told patient that she would have to call before coming in. Patient voiced understanding.

## 2015-12-31 NOTE — Discharge Summary (Signed)
Physician Discharge Summary  Brittany Foster [id number] DOB: 08/27/1985 DOA: 12/31/2015  PCP: Jeanann LewandowskyJEGEDE, Kai Calico, MD  Admit date: 12/31/2015  Discharge date: 12/31/2015  Time spent: 30 minutes  Discharge Diagnoses:  Active Problems:   Sickle cell anemia with pain [facility](HCC)  Discharge Condition: Stable  Diet recommendation: Regular  Filed Weights   12/31/15 1009  Weight: 125 lb (56.7 kg)   History of present illness:  Brittany Foster is a 30 y.o. female with history of sickle cell disease, very opiate tolerant and possibly dependent whopresented to the day hospital today with complaint of pain in her back, hip joints and legs consistent with her sickle cell pain crisis. She had called yesterday but she did not have a ride to the day hospital, then she made a ride arrangement for this morning.Patient is not adherent with her home medications including hydroxyurea, folic acid and even pain medications.She however denies any suicidal thoughts or ideation. She rated her pain at 8 out of 10 today, she denies any fever, no urinary symptoms, no headache, no shortness of breath, no chest pain, no nausea, vomiting or diarrhea. No joint swelling or redness.  Hospital Course:  Brittany CampbellMiranda Pacifico was admitted to the day hospital with sickle cell painful crisis. Patient was treated with her individualized care plan with IV Dilaudid, IV Toradol as well as IV fluids. Brittany Foster showed some improvement symptomatically, pain improved from 8 to 5/10 at the time of discharge. Patient was discharged home in a hemodynamically stable condition. Brittany Foster will follow-up at the clinic as previously scheduled, continue with home medications as per prior to admission.  Discharge Instructions We discussed the need for good hydration, monitoring of hydration status, avoidance of heat, cold, stress, and infection triggers. We discussed the need to be compliant with taking Hydrea. Brittany Foster was reminded of the need to seek medical  attention of any symptoms of bleeding, anemia, or infection occurs.  Discharge Exam: Vitals:   12/31/15 1206 12/31/15 1428  BP: (!) 102/57 (!) 97/54  Pulse: 75 88  Resp: 16 18  Temp: 98.5 F (36.9 C)    General appearance: alert, cooperative and no distress Eyes: conjunctivae/corneas clear. PERRL, EOM's intact. Fundi benign. Neck: no adenopathy, no carotid bruit, no JVD, supple, symmetrical, trachea midline and thyroid not enlarged, symmetric, no tenderness/mass/nodules Back: symmetric, no curvature. ROM normal. No CVA tenderness. Resp: clear to auscultation bilaterally Chest wall: no tenderness Cardio: regular rate and rhythm, S1, S2 normal, no murmur, click, rub or gallop GI: soft, non-tender; bowel sounds normal; no masses, no organomegaly Extremities: extremities normal, atraumatic, no cyanosis or edema Pulses: 2+ and symmetric Skin: Skin color, texture, turgor normal. No rashes or lesions Neurologic: Grossly normal   Current Discharge Medication List    CONTINUE these medications which have NOT CHANGED   Details  DULoxetine (CYMBALTA) 60 MG capsule TAKE 1 CAPSULE BY MOUTH DAILY. Qty: 30 capsule, Refills: 3   Associated Diagnoses: Chronic pain syndrome    folic acid (FOLVITE) 1 MG tablet Take 1 tablet (1 mg total) by mouth daily. 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 MAY TAKE WITH FOOD TO MINIMIZE GI SIDE EFFECTS. Qty: 60 capsule, Refills: 3   Associated Diagnoses: Hb-SS disease without crisis (HCC)    ibuprofen (ADVIL,MOTRIN) 200 MG tablet Take 400 mg by mouth every 6 (six) hours as needed for moderate pain.     morphine (MS  CONTIN) 30 MG 12 hr tablet Take 1 tablet (30 mg total) by mouth every 12 (twelve) hours. Qty: 60 tablet, Refills: 0   Associated Diagnoses: Hb-SS disease without crisis (HCC)    Topiramate ER 100 MG CP24 Take 100 mg by mouth  at bedtime.    gabapentin (NEURONTIN) 300 MG capsule TAKE 1 CAPSULE BY MOUTH 3 TIMES DAILY. Qty: 90 capsule, Refills: 3    oxyCODONE-acetaminophen (PERCOCET) 10-325 MG tablet Take 1 tablet by mouth every 4 (four) hours as needed for pain. Qty: 90 tablet, Refills: 0    potassium chloride SA (K-DUR,KLOR-CON) 20 MEQ tablet Take 2 tablets (40 mEq total) by mouth daily. Qty: 30 tablet, Refills: 1       Allergies  Allergen Reactions  . Ultram [Tramadol] Other (See Comments)    seizures  . Zofran [Ondansetron Hcl] Nausea And Vomiting  . Buprenorphine Hcl Hives and Rash    Shaking Tolerates Percocet, Norco, and buprenorphine  . Morphine And Related Hives, Rash and Other (See Comments)    Shaking Tolerates Percocet, Norco, Dilaudid, and buprenorphine  . Tape Rash    Significant Diagnostic Studies: No results found.  Signed:  Jeanann LewandowskyJEGEDE, Tomma Ehinger MD, MHA, FACP, FAAP, CPE   12/31/2015, 4:37 PM

## 2015-12-31 NOTE — Progress Notes (Signed)
Patient ID: Brittany Foster, female   DOB: 01/03/1986, 30 y.o.   MRN: [id number] [facility] of [facility] pain, generalized with intensity of 8/10. Treated with IV fluids, IV Toradol and IV Dilaudid pushes and po MS Contin. Received potassium po as ordered. Pain level down to 7/10 at time of discharge with extra IV push. Patient says she can try to manage pain at home. Alert, oriented ambulatory with difficulty at time of discharge. Went over discharge instructions and copy given to patient. Patient signed and obtained her prescriptions.  Called for her ride and they will pick up.

## 2015-12-31 NOTE — Telephone Encounter (Signed)
[facility] Pt states her pain is 8/10 and it is generalized. Pt denies fever, chest pain, vomiting, and diarrhea. She states her upper abd is hurting. She last had her MS Contin and Oxycodone at 6:30 am.  After speaking with the provider, Dr. Hyman HopesJegede, patient was told that she can come in for treatment. Pt stated she would call her ride and be here around 9am.

## 2015-12-31 NOTE — Discharge Instructions (Signed)

## 2016-01-05 ENCOUNTER — Inpatient Hospital Stay (HOSPITAL_COMMUNITY)
Admission: EM | Admit: 2016-01-05 | Discharge: 2016-01-12 | DRG: 812 | Disposition: A | Discharge status: Home / Self Care | Payer: Medicaid Other | Attending: Internal Medicine | Admitting: Internal Medicine

## 2016-01-05 ENCOUNTER — Encounter (HOSPITAL_COMMUNITY): Payer: Self-pay | Admitting: *Deleted

## 2016-01-05 ENCOUNTER — Observation Stay (HOSPITAL_COMMUNITY): Payer: Medicaid Other

## 2016-01-05 DIAGNOSIS — Z832 Family history of diseases of the blood and blood-forming organs and certain disorders involving the immune mechanism: Secondary | ICD-10-CM

## 2016-01-05 DIAGNOSIS — Z79891 Long term (current) use of opiate analgesic: Secondary | ICD-10-CM

## 2016-01-05 DIAGNOSIS — D5700 Hb-SS disease with crisis, unspecified: Principal | ICD-10-CM | POA: Diagnosis present

## 2016-01-05 DIAGNOSIS — F332 Major depressive disorder, recurrent severe without psychotic features: Secondary | ICD-10-CM | POA: Diagnosis present

## 2016-01-05 DIAGNOSIS — Y92009 Unspecified place in unspecified non-institutional (private) residence as the place of occurrence of the external cause: Secondary | ICD-10-CM

## 2016-01-05 DIAGNOSIS — Z91048 Other nonmedicinal substance allergy status: Secondary | ICD-10-CM

## 2016-01-05 DIAGNOSIS — D72829 Elevated white blood cell count, unspecified: Secondary | ICD-10-CM

## 2016-01-05 DIAGNOSIS — R079 Chest pain, unspecified: Secondary | ICD-10-CM

## 2016-01-05 DIAGNOSIS — F32A Depression, unspecified: Secondary | ICD-10-CM | POA: Diagnosis present

## 2016-01-05 DIAGNOSIS — R52 Pain, unspecified: Secondary | ICD-10-CM

## 2016-01-05 DIAGNOSIS — Z888 Allergy status to other drugs, medicaments and biological substances status: Secondary | ICD-10-CM

## 2016-01-05 DIAGNOSIS — G43909 Migraine, unspecified, not intractable, without status migrainosus: Secondary | ICD-10-CM | POA: Diagnosis present

## 2016-01-05 DIAGNOSIS — T451X6A Underdosing of antineoplastic and immunosuppressive drugs, initial encounter: Secondary | ICD-10-CM | POA: Diagnosis present

## 2016-01-05 DIAGNOSIS — Z885 Allergy status to narcotic agent status: Secondary | ICD-10-CM

## 2016-01-05 DIAGNOSIS — R269 Unspecified abnormalities of gait and mobility: Secondary | ICD-10-CM | POA: Diagnosis not present

## 2016-01-05 DIAGNOSIS — F329 Major depressive disorder, single episode, unspecified: Secondary | ICD-10-CM | POA: Diagnosis present

## 2016-01-05 DIAGNOSIS — G8929 Other chronic pain: Secondary | ICD-10-CM | POA: Diagnosis present

## 2016-01-05 DIAGNOSIS — Z87891 Personal history of nicotine dependence: Secondary | ICD-10-CM

## 2016-01-05 LAB — CBC WITH DIFFERENTIAL/PLATELET
BASOS ABS: 0 10*3/uL (ref 0.0–0.1)
Basophils Relative: 0 %
Eosinophils Absolute: 0.1 10*3/uL (ref 0.0–0.7)
Eosinophils Relative: 0 %
HEMATOCRIT: 23.4 % — AB (ref 36.0–46.0)
HEMOGLOBIN: 8.1 g/dL — AB (ref 12.0–15.0)
LYMPHS PCT: 35 %
Lymphs Abs: 5.2 10*3/uL — ABNORMAL HIGH (ref 0.7–4.0)
MCH: 33.5 pg (ref 26.0–34.0)
MCHC: 34.6 g/dL (ref 30.0–36.0)
MCV: 96.7 fL (ref 78.0–100.0)
MONO ABS: 1.4 10*3/uL — AB (ref 0.1–1.0)
Monocytes Relative: 9 %
NEUTROS ABS: 8.2 10*3/uL — AB (ref 1.7–7.7)
NEUTROS PCT: 55 %
Platelets: 258 10*3/uL (ref 150–400)
RBC: 2.42 MIL/uL — AB (ref 3.87–5.11)
RDW: 20.6 % — ABNORMAL HIGH (ref 11.5–15.5)
WBC: 14.9 10*3/uL — AB (ref 4.0–10.5)

## 2016-01-05 LAB — RETICULOCYTES
RBC.: 2.42 MIL/uL — ABNORMAL LOW (ref 3.87–5.11)
RETIC COUNT ABSOLUTE: 467.1 10*3/uL — AB (ref 19.0–186.0)
Retic Ct Pct: 19.3 % — ABNORMAL HIGH (ref 0.4–3.1)

## 2016-01-05 LAB — COMPREHENSIVE METABOLIC PANEL
ALBUMIN: 4.2 g/dL (ref 3.5–5.0)
ALT: 31 U/L (ref 14–54)
ANION GAP: 6 (ref 5–15)
AST: 47 U/L — ABNORMAL HIGH (ref 15–41)
Alkaline Phosphatase: 74 U/L (ref 38–126)
BILIRUBIN TOTAL: 2.6 mg/dL — AB (ref 0.3–1.2)
BUN: 11 mg/dL (ref 6–20)
CO2: 23 mmol/L (ref 22–32)
Calcium: 9.1 mg/dL (ref 8.9–10.3)
Chloride: 109 mmol/L (ref 101–111)
Creatinine, Ser: 0.47 mg/dL (ref 0.44–1.00)
GFR calc Af Amer: >60 mL/min (ref >60)
GFR calc non Af Amer: >60 mL/min (ref >60)
GLUCOSE: 114 mg/dL — AB (ref 65–99)
POTASSIUM: 3.5 mmol/L (ref 3.5–5.1)
SODIUM: 138 mmol/L (ref 135–145)
TOTAL PROTEIN: 7.7 g/dL (ref 6.5–8.1)

## 2016-01-05 MED ORDER — TOPIRAMATE ER 100 MG PO CAP24: 100.0000 mg | ORAL_CAPSULE | Freq: Every day | ORAL | Status: DC

## 2016-01-05 MED ORDER — HYDROMORPHONE HCL 2 MG/ML IJ SOLN: 2.0000 mg | INTRAMUSCULAR | Status: DC

## 2016-01-05 MED ORDER — ZOLPIDEM TARTRATE 5 MG PO TABS: 5.0000 mg | ORAL_TABLET | Freq: Every evening | ORAL | Status: DC | PRN

## 2016-01-05 MED ORDER — POLYETHYLENE GLYCOL 3350 17 G PO PACK: 17.0000 g | PACK | Freq: Every day | ORAL | Status: DC | PRN

## 2016-01-05 MED ORDER — SENNOSIDES-DOCUSATE SODIUM 8.6-50 MG PO TABS
1.0000 | ORAL_TABLET | Freq: Two times a day (BID) | ORAL | Status: DC
  Administered 2016-01-06 – 2016-01-09 (×3): 1 via ORAL
  Filled 2016-01-05 (×13): qty 1

## 2016-01-05 MED ORDER — DEXTROSE-NACL 5-0.45 % IV SOLN
INTRAVENOUS | Status: DC
Start: 2016-01-05 — End: 2016-01-12
  Administered 2016-01-05 – 2016-01-06 (×2): via INTRAVENOUS
  Administered 2016-01-06: 1000 mL via INTRAVENOUS
  Administered 2016-01-07: 21:00:00 via INTRAVENOUS
  Administered 2016-01-07 (×2): 1000 mL via INTRAVENOUS
  Administered 2016-01-07 – 2016-01-08 (×2): via INTRAVENOUS

## 2016-01-05 MED ORDER — HYDROMORPHONE HCL 2 MG/ML IJ SOLN
2.0000 mg | INTRAMUSCULAR | Status: AC
  Administered 2016-01-05: 2 mg via INTRAVENOUS
  Filled 2016-01-05: qty 1

## 2016-01-05 MED ORDER — HYDROMORPHONE HCL 2 MG/ML IJ SOLN: 2.0000 mg | INTRAMUSCULAR | Status: AC

## 2016-01-05 MED ORDER — MORPHINE SULFATE ER 30 MG PO TBCR
30.0000 mg | EXTENDED_RELEASE_TABLET | Freq: Two times a day (BID) | ORAL | Status: DC
  Administered 2016-01-06 – 2016-01-12 (×14): 30 mg via ORAL
  Filled 2016-01-05 (×14): qty 1

## 2016-01-05 MED ORDER — HYDROXYZINE HCL 25 MG PO TABS: 25.0000 mg | ORAL_TABLET | ORAL | Status: DC | PRN

## 2016-01-05 MED ORDER — KETOROLAC TROMETHAMINE 30 MG/ML IJ SOLN
30.0000 mg | INTRAMUSCULAR | Status: AC
  Administered 2016-01-05: 30 mg via INTRAVENOUS
  Filled 2016-01-05: qty 1

## 2016-01-05 MED ORDER — ENOXAPARIN SODIUM 40 MG/0.4ML ~~LOC~~ SOLN
40.0000 mg | SUBCUTANEOUS | Status: DC
  Filled 2016-01-05 (×5): qty 0.4

## 2016-01-05 MED ORDER — ONDANSETRON HCL 4 MG/2ML IJ SOLN
4.0000 mg | INTRAMUSCULAR | Status: DC | PRN
  Administered 2016-01-05: 4 mg via INTRAVENOUS
  Filled 2016-01-05: qty 2

## 2016-01-05 MED ORDER — PROMETHAZINE HCL 12.5 MG RE SUPP
12.5000 mg | RECTAL | Status: DC | PRN
  Filled 2016-01-05: qty 2

## 2016-01-05 MED ORDER — DIPHENHYDRAMINE HCL 25 MG PO CAPS
50.0000 mg | ORAL_CAPSULE | Freq: Once | ORAL | Status: AC
  Administered 2016-01-05: 50 mg via ORAL
  Filled 2016-01-05: qty 2

## 2016-01-05 MED ORDER — DULOXETINE HCL 60 MG PO CPEP
60.0000 mg | ORAL_CAPSULE | Freq: Every day | ORAL | Status: DC
  Administered 2016-01-06 – 2016-01-12 (×7): 60 mg via ORAL
  Filled 2016-01-05 (×7): qty 1

## 2016-01-05 MED ORDER — HYDROMORPHONE HCL 2 MG/ML IJ SOLN
2.0000 mg | INTRAMUSCULAR | Status: DC | PRN
  Administered 2016-01-06 (×4): 2 mg via INTRAVENOUS
  Filled 2016-01-05 (×4): qty 1

## 2016-01-05 MED ORDER — TOPIRAMATE 25 MG PO TABS
50.0000 mg | ORAL_TABLET | Freq: Two times a day (BID) | ORAL | Status: DC
  Administered 2016-01-06 – 2016-01-12 (×14): 50 mg via ORAL
  Filled 2016-01-05 (×14): qty 2

## 2016-01-05 MED ORDER — HYDROMORPHONE HCL 2 MG/ML IJ SOLN: 2.0000 mg | INTRAMUSCULAR | Status: DC | PRN

## 2016-01-05 MED ORDER — PROMETHAZINE HCL 25 MG PO TABS
12.5000 mg | ORAL_TABLET | ORAL | Status: DC | PRN
  Administered 2016-01-07: 25 mg via ORAL
  Filled 2016-01-05: qty 1

## 2016-01-05 MED ORDER — HYDROXYUREA 500 MG PO CAPS
1000.0000 mg | ORAL_CAPSULE | Freq: Every day | ORAL | Status: DC
  Administered 2016-01-06 – 2016-01-12 (×7): 1000 mg via ORAL
  Filled 2016-01-05 (×7): qty 2

## 2016-01-05 MED ORDER — FOLIC ACID 1 MG PO TABS
1.0000 mg | ORAL_TABLET | Freq: Every day | ORAL | Status: DC
  Administered 2016-01-06 – 2016-01-12 (×7): 1 mg via ORAL
  Filled 2016-01-05 (×7): qty 1

## 2016-01-05 MED ORDER — KETOROLAC TROMETHAMINE 30 MG/ML IJ SOLN
30.0000 mg | Freq: Four times a day (QID) | INTRAMUSCULAR | Status: AC
  Administered 2016-01-06 – 2016-01-10 (×19): 30 mg via INTRAVENOUS
  Filled 2016-01-05 (×20): qty 1

## 2016-01-05 NOTE — ED Triage Notes (Signed)
Pt states that she woke up this am and her legs, back and chest were hurting; pt states that she took her medications with relief but that the pain returned this evening and the home medications did not help this time

## 2016-01-05 NOTE — ED Provider Notes (Signed)
Emergency Department Provider Note   I have reviewed the triage vital signs and the nursing notes.   HISTORY  Chief Complaint Sickle Cell Pain Crisis   HPI Brittany Foster is a 30 y.o. female with PMH of SS anemia presents to the emergency department for evaluation of lower back pain and bilateral leg pain that feels similar to her sickle cell crisis pain. The patient states that she began having symptoms yesterday. She took her home medications which initially helped her symptoms but pain has since returned. She denies any chest pain or difficulty breathing. No fevers.   Past Medical History:  Diagnosis Date  . Anemia   . Depression, major, recurrent (HCC)   . Migraines   . Sickle cell anemia [facility](HCC)     Patient Active Problem List   Diagnosis Date Noted  . Vasoocclusive sickle cell crisis (HCC) 08/21/2015  . Depression, major, recurrent (HCC)   . Family planning, Depo-Provera contraception monitoring/administration 08/08/2015  . Sickle cell anemia with crisis (HCC) 07/12/2015  . Sickle cell anemia with pain (HCC) 05/28/2015  . Intractable pain   . Chest pain 03/07/2015  . Leukocytosis 03/07/2015  . Vitamin D deficiency 01/08/2015  . Chronic pain syndrome 01/08/2015  . Sickle cell crisis (HCC) 10/22/2014  . Hypokalemia 08/21/2014  . Depression 08/09/2014  . 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 Sickle cell disease with crisis (HCC) 07/12/2014  . Hb-SS disease with crisis (HCC) 06/11/2014  . Pericardial effusion   . Anemia 02/09/2014  . Neuropathy (HCC) 02/09/2014  . Chronic headache disorder 02/09/2014  . Abnormal CT scan, chest   . Sickle cell pain crisis (HCC) 12/30/2013    Past Surgical History:  Procedure Laterality Date  . CESAREAN SECTION    . CHOLECYSTECTOMY  2000  . IR GENERIC HISTORICAL  10/08/2015   IR US GUIDE VASC ACCESS RIGHT 10/08/2015 Simonne ComeJohn Watts, MD [facility]  . IR GENERIC HISTORICAL  10/08/2015   IR FLUORO GUIDE CV LINE RIGHT 10/08/2015  Simonne ComeJohn Watts, MD [facility]  . MULTIPLE TOOTH EXTRACTIONS N/A   . port a cath placement Right    about 6-7 years ago  . removal of porta cath Right 09/11/15  . TUBAL LIGATION        Allergies Ultram [tramadol]; Zofran [ondansetron hcl]; Buprenorphine hcl; Morphine and related; and Tape  Family History  Problem Relation Age of Onset  . Sickle cell trait Father   . Sickle cell trait Mother   . Sickle cell anemia Other     Social History Social History  Substance Use Topics  . Smoking status: Former Games developermoker  . Smokeless tobacco: Never Used  . Alcohol use No    Review of Systems  Constitutional: No fever/chills Eyes: No visual changes. ENT: No sore throat. Cardiovascular: Denies chest pain. Respiratory: Denies shortness of breath. Gastrointestinal: No abdominal pain.  No nausea, no vomiting.  No diarrhea.  No constipation. Genitourinary: Negative for dysuria. Musculoskeletal: Positive for back pain and bilateral hip pain.  Skin: Negative for rash. Neurological: Negative for headaches, focal weakness or numbness.  10-point ROS otherwise negative.  ____________________________________________   PHYSICAL EXAM:  VITAL SIGNS: ED Triage Vitals  Enc Vitals Group     BP 01/05/16 1923 113/74     Pulse Rate 01/05/16 1923 96     Resp 01/05/16 1923 18     Temp 01/05/16 1923 99.2 F (37.3 C)     Temp Source 01/05/16 1923 Oral     SpO2 01/05/16  1923 96 %     Weight 01/05/16 1923 126 lb 6.4 oz (57.3 kg)     Height 01/05/16 2024 5' (1.524 m)     Pain Score 01/05/16 1959 8   Constitutional: Alert and oriented. Well appearing and in no acute distress. Eyes: Conjunctivae are normal.  Head: Atraumatic. Nose: No congestion/rhinnorhea. Mouth/Throat: Mucous membranes are moist.  Oropharynx non-erythematous. Neck: No stridor.  Cardiovascular: Normal rate, regular rhythm. Good peripheral circulation. Grossly normal heart sounds.   Respiratory: Normal respiratory effort.  No  retractions. Lungs CTAB. Gastrointestinal: Soft and nontender. No distention.  Musculoskeletal: No lower extremity tenderness nor edema. No gross deformities of extremities. Neurologic:  Normal speech and language. No gross focal neurologic deficits are appreciated.  Skin:  Skin is warm, dry and intact. No rash noted.   ____________________________________________   LABS (all labs ordered are listed, but only abnormal results are displayed)  Labs Reviewed  COMPREHENSIVE METABOLIC PANEL - Abnormal; Notable for the following:       Result Value   Glucose, Bld 114 (*)    AST 47 (*)    Total Bilirubin 2.6 (*)    All other components within normal limits  CBC WITH DIFFERENTIAL/PLATELET - Abnormal; Notable for the following:    WBC 14.9 (*)    RBC 2.42 (*)    Hemoglobin 8.1 (*)    HCT 23.4 (*)    RDW 20.6 (*)    Neutro Abs 8.2 (*)    Lymphs Abs 5.2 (*)    Monocytes Absolute 1.4 (*)    All other components within normal limits  RETICULOCYTES - Abnormal; Notable for the following:    Retic Ct Pct 19.3 (*)    RBC. 2.42 (*)    Retic Count, Manual 467.1 (*)    All other components within normal limits  HCG, QUANTITATIVE, PREGNANCY   ____________________________________________  RADIOLOGY  None ____________________________________________   PROCEDURES  Procedure(s) performed:   Procedures  None ____________________________________________   INITIAL IMPRESSION / ASSESSMENT AND PLAN / ED COURSE  Pertinent labs & imaging results that were available during my care of the patient were reviewed by me and considered in my medical decision making (see chart for details).  Patient resents emergency department for evaluation of sickle cell crisis. Very low suspicion for acute chest. I have reviewed the patient's individual as care plan but chosen to treat pain aggressively in this particular scenario with IV pain medication. Discussed my impression with her. Gave Toradol, IVF,  and IV narcotics per the SS order set.   10:50 PM Patient's pain not improved after multiple rounds of IV Dilaudid, Toradol, IV fluids. Patient feels that this level of pain and requires admission. Discussed the case the hospitalist.  Discussed patient's case with hospitalist, Dr. Clyde Lundborg.  Recommend admission to med-surg, observation bed.  I will place holding orders per their request. Patient and family (if present) updated with plan. Care transferred to hospitalist service.  I reviewed all nursing notes, vitals, pertinent old records, EKGs, labs, imaging (as available).  ____________________________________________  FINAL CLINICAL IMPRESSION(S) / ED DIAGNOSES  Final diagnoses:  Sickle cell pain crisis (HCC)  Chest pain     MEDICATIONS GIVEN DURING THIS VISIT:  Medications  dextrose 5 %-0.45 % sodium chloride infusion ( Intravenous New Bag/Given 01/06/16 0411)  morphine (MS CONTIN) 12 hr tablet 30 mg (30 mg Oral Given 01/06/16 0941)  DULoxetine (CYMBALTA) DR capsule 60 mg (60 mg Oral Given 01/06/16 0941)  folic acid (FOLVITE) tablet 1 mg (  1 mg Oral Given 01/06/16 0941)  hydroxyurea (HYDREA) capsule 1,000 mg (1,000 mg Oral Given 01/06/16 0942)  senna-docusate (Senokot-S) tablet 1 tablet (1 tablet Oral Not Given 01/06/16 0941)  polyethylene glycol (MIRALAX / GLYCOLAX) packet 17 g (not administered)  enoxaparin (LOVENOX) injection 40 mg (40 mg Subcutaneous Not Given 01/06/16 1000)  ketorolac (TORADOL) 30 MG/ML injection 30 mg (30 mg Intravenous Not Given 01/06/16 0553)  promethazine (PHENERGAN) tablet 12.5-25 mg (not administered)    Or  promethazine (PHENERGAN) suppository 12.5-25 mg (not administered)  zolpidem (AMBIEN) tablet 5 mg (not administered)  topiramate (TOPAMAX) tablet 50 mg (50 mg Oral Given 01/06/16 0941)  naloxone (NARCAN) injection 0.4 mg (not administered)    And  sodium chloride flush (NS) 0.9 % injection 9 mL (not administered)  HYDROmorphone (DILAUDID) 1 mg/mL PCA  injection ( Intravenous Set-up / Initial Syringe 01/06/16 1018)  HYDROmorphone (DILAUDID) injection 2 mg (not administered)  diphenhydrAMINE (BENADRYL) capsule 25-50 mg (not administered)    Or  diphenhydrAMINE (BENADRYL) 25 mg in sodium chloride 0.9 % 50 mL IVPB (not administered)  ketorolac (TORADOL) 30 MG/ML injection 30 mg (30 mg Intravenous Given 01/05/16 2108)  HYDROmorphone (DILAUDID) injection 2 mg (2 mg Intravenous Given 01/05/16 2108)    Or  HYDROmorphone (DILAUDID) injection 2 mg ( Subcutaneous See Alternative 01/05/16 2108)  HYDROmorphone (DILAUDID) injection 2 mg (2 mg Intravenous Given 01/05/16 2141)    Or  HYDROmorphone (DILAUDID) injection 2 mg ( Subcutaneous See Alternative 01/05/16 2141)  HYDROmorphone (DILAUDID) injection 2 mg (2 mg Intravenous Given 01/05/16 2230)    Or  HYDROmorphone (DILAUDID) injection 2 mg ( Subcutaneous See Alternative 01/05/16 2230)  diphenhydrAMINE (BENADRYL) capsule 50 mg (50 mg Oral Given 01/05/16 2108)     NEW OUTPATIENT MEDICATIONS STARTED DURING THIS VISIT:  None   Note:  This document was prepared using Dragon voice recognition software and may include unintentional dictation errors.  Alona BeneJoshua Peder Allums, MD Emergency Medicine   Maia PlanJoshua G Jordyne Poehlman, MD 01/06/16 1026

## 2016-01-05 NOTE — ED Notes (Signed)
WILL TRANSPORT PT TO 3W 1327-1. AAOX4. PT IN NO APPARENT DISTRESS. IV FLUIDS D5 0.45% INFUSING W/O PAIN OR SWELLING. THE OPPORTUNITY TO ASK QUESTIONS WAS PROVIDED.

## 2016-01-05 NOTE — H&P (Signed)
History and Physical    Brittany Foster [id number] DOB: 09/07/1985 DOA: 01/05/2016  Referring MD/NP/PA:   PCP: Jeanann LewandowskyJEGEDE, OLUGBEMIGA, MD   Patient coming from:  The patient is coming from home.  At baseline, pt is independent for most of ADL.   Chief Complaint: Pain all over  HPI: Brittany CampbellMiranda Foster is a 30 y.o. female with medical history significant of sickle cell anemia, migraine headaches, depression, who presents with pain all over.  Pt states that she woke up this am and her legs, back and chest were hurting. The pain is constant, 10 out of 10 in severity, nonradiating. It is not aggravated or alleviated by any known factors. Patient states that her pain is typical for her past sickle cell pain flare up. She has mild shortness of breath, but no tenderness over calf areas. Pt states that she took her home pain medications without help since this AM. She has nausea, vomited once. No diarrhea or abdominal pain. No cough, fever, chills, symptoms for UTI or unilateral weakness.  ED Course: pt was found to have stable hemoglobin, WBC 14.9, electrolytes and renal function okay, temperature 99.2, no tachycardia, oxygen saturation 95% on room air. Patient is placed on MedSurg bed for observation.  Review of Systems:   General: no fevers, chills, no changes in body weight, has poor appetite, has fatigue HEENT: no blurry vision, hearing changes or sore throat Respiratory: has dyspnea, no coughing, wheezing CV: has chest pain, no palpitations GI: has nausea, vomiting, no abdominal pain, diarrhea, constipation GU: no dysuria, burning on urination, increased urinary frequency, hematuria  Ext: no leg edema Neuro: no unilateral weakness, numbness, or tingling, no vision change or hearing loss Skin: no rash, no skin tear. MSK: Pain all over Heme: No easy bruising.  Travel history: No recent long distant travel.  Allergy:  Allergies  Allergen Reactions  . Ultram [Tramadol] Other (See Comments)   seizures  . Zofran [Ondansetron Hcl] Nausea And Vomiting  . Buprenorphine Hcl Hives and Rash    Shaking Tolerates Percocet, Norco, and buprenorphine  . Morphine And Related Hives, Rash and Other (See Comments)    Shaking Tolerates Percocet, Norco, Dilaudid, and buprenorphine  . Tape Rash    Past Medical History:  Diagnosis Date  . Anemia   . Depression, major, recurrent (HCC)   . Migraines   . Sickle cell anemia (HCC)     Past Surgical History:  Procedure Laterality Date  . CESAREAN SECTION    . CHOLECYSTECTOMY  2000  . IR GENERIC HISTORICAL  10/08/2015   IR US GUIDE VASC ACCESS RIGHT 10/08/2015 Simonne ComeJohn Watts, MD [facility]  . IR GENERIC HISTORICAL  10/08/2015   IR FLUORO GUIDE CV LINE RIGHT 10/08/2015 Simonne ComeJohn Watts, MD [facility]  . MULTIPLE TOOTH EXTRACTIONS N/A   . port a cath placement Right    about 6-7 years ago  . removal of porta cath Right 09/11/15  . TUBAL LIGATION      Social History:  reports that she has quit smoking. She has never used smokeless tobacco. She reports that she does not drink alcohol or use drugs.  Family History:  Family History  Problem Relation Age of Onset  . Sickle cell trait Father   . Sickle cell trait Mother   . Sickle cell anemia Other      Prior to Admission medications   Medication Sig Start Date End Date Taking? Authorizing Provider  DULoxetine (CYMBALTA) 60 MG capsule TAKE 1 CAPSULE BY MOUTH DAILY. 11/27/15  Yes Quentin Angst, MD  folic acid (FOLVITE) 1 MG tablet Take 1 tablet (1 mg total) by mouth daily. 08/08/15  Yes [name], FNP  hydroxyurea (HYDREA) 500 MG capsule TAKE 2 CAPSULES BY MOUTH DAILY. MAY TAKE WITH FOOD TO MINIMIZE GI SIDE EFFECTS. 08/08/15  Yes [name], FNP  morphine (MS CONTIN) 30 MG 12 hr tablet Take 1 tablet (30 mg total) by mouth every 12 (twelve) hours. 12/31/15  Yes Quentin Angst, MD  oxyCODONE-acetaminophen (PERCOCET) 10-325 MG tablet Take 1 tablet by mouth every 4 (four) hours as needed  for pain. 12/31/15 01/15/16 Yes Quentin Angst, MD  Topiramate ER 100 MG CP24 Take 100 mg by mouth at bedtime.   Yes Historical Provider, MD  gabapentin (NEURONTIN) 300 MG capsule TAKE 1 CAPSULE BY MOUTH 3 TIMES DAILY. Patient not taking: Reported on 01/05/2016 11/27/15   Quentin Angst, MD  potassium chloride SA (K-DUR,KLOR-CON) 20 MEQ tablet Take 2 tablets (40 mEq total) by mouth daily. Patient not taking: Reported on 01/05/2016 11/23/15   Altha Harm, MD    Physical Exam: Vitals:   01/05/16 1923 01/05/16 2024 01/05/16 2200 01/05/16 2300  BP: 113/74  (!) 124/49 107/69  Pulse: 96  88 86  Resp: 18  16 16   Temp: 99.2 F (37.3 C)   98 F (36.7 C)  TempSrc: Oral     SpO2: 96%  95% 98%  Weight: 57.3 kg (126 lb 6.4 oz) 57.2 kg (126 lb)    Height:  5' (1.524 m)     General: Not in acute distress. Dry mucus and membrane. HEENT:       Eyes: PERRL, EOMI, no scleral icterus.       ENT: No discharge from the ears and nose, no pharynx injection, no tonsillar enlargement.        Neck: No JVD, no bruit, no mass felt. Heme: No neck lymph node enlargement. Cardiac: S1/S2, RRR, No murmurs, No gallops or rubs. Respiratory: No rales, wheezing, rhonchi or rubs. GI: Soft, nondistended, nontender, no rebound pain, no organomegaly, BS present. GU: No hematuria Ext: No pitting leg edema bilaterally. 2+DP/PT pulse bilaterally. Musculoskeletal: No joint deformities, No joint redness or warmth, no limitation of ROM in spin. Skin: No rashes.  Neuro: Alert, oriented X3, cranial nerves II-XII grossly intact, moves all extremities normally.  Psych: Patient is not psychotic, no suicidal or hemocidal ideation.  Labs on Admission: I have personally reviewed following labs and imaging studies  CBC:  Recent Labs Lab 12/31/15 1028 01/05/16 2023  WBC 10.5 14.9*  NEUTROABS 5.7 8.2*  HGB 7.7* 8.1*  HCT 22.0* 23.4*  MCV 93.2 96.7  PLT 258 258   Basic Metabolic Panel:  Recent Labs Lab  12/31/15 1028 01/05/16 2023  NA 138 138  K 2.9* 3.5  CL 109 109  CO2 23 23  GLUCOSE 114* 114*  BUN 7 11  CREATININE 0.36* 0.47  CALCIUM 8.6* 9.1   GFR: Estimated Creatinine Clearance: 80.7 mL/min (by C-G formula based on SCr of 0.47 mg/dL). Liver Function Tests:  Recent Labs Lab 12/31/15 1028 01/05/16 2023  AST 52* 47*  ALT 35 31  ALKPHOS 72 74  BILITOT 2.8* 2.6*  PROT 7.0 7.7  ALBUMIN 4.0 4.2   No results for input(s): LIPASE, AMYLASE in the last 168 hours. No results for input(s): AMMONIA in the last 168 hours. Coagulation Profile: No results for input(s): INR, PROTIME in the last 168 hours. Cardiac Enzymes: No results  for input(s): CKTOTAL, CKMB, CKMBINDEX, TROPONINI in the last 168 hours. BNP (last 3 results) No results for input(s): PROBNP in the last 8760 hours. HbA1C: No results for input(s): HGBA1C in the last 72 hours. CBG: No results for input(s): GLUCAP in the last 168 hours. Lipid Profile: No results for input(s): CHOL, HDL, LDLCALC, TRIG, CHOLHDL, LDLDIRECT in the last 72 hours. Thyroid Function Tests: No results for input(s): TSH, T4TOTAL, FREET4, T3FREE, THYROIDAB in the last 72 hours. Anemia Panel:  Recent Labs  01/05/16 2023  RETICCTPCT 19.3*   Urine analysis:    Component Value Date/Time   COLORURINE AMBER (A) 12/31/2015 1223   APPEARANCEUR CLOUDY (A) 12/31/2015 1223   LABSPEC 1.013 12/31/2015 1223   PHURINE 6.0 12/31/2015 1223   GLUCOSEU NEGATIVE 12/31/2015 1223   HGBUR NEGATIVE 12/31/2015 1223   BILIRUBINUR NEGATIVE 12/31/2015 1223   KETONESUR NEGATIVE 12/31/2015 1223   PROTEINUR NEGATIVE 12/31/2015 1223   UROBILINOGEN 1.0 11/13/2014 1338   NITRITE NEGATIVE 12/31/2015 1223   LEUKOCYTESUR NEGATIVE 12/31/2015 1223   Sepsis Labs: @LABRCNTIP (procalcitonin:4,lacticidven:4) )No results found for this or any previous visit (from the past 240 hour(s)).   Radiological Exams on Admission: No results found.   EKG: Not done in ED, will  get one.   Assessment/Plan Principal Problem:   Sickle cell anemia with crisis [facility](HCC) Active Problems:   Sickle cell pain crisis (HCC)   Depression   Sickle cell crisis (HCC)   Leukocytosis    Sickle cell anemia with crisis: Hemoglobin stable, Hgb is 8.1 which was 7.7 on 12/31/15. No signs of infection. Patient has chest pain and mild shortness of breath, but no signs of DVT or oxygen saturation, no suspicion for PE.  -will place on Med-surg bed for obs -per  FYI, Dr. Bebe ShaggyWickline made care plan for this pt on 11/28/15, will follow this care plan. - IV hydration 125 ml/hr D5.45 NS - Toradol 30 mg IV every 6 hours. - IV Dilaudid 2 mg every 3 hours. - continue home MS contin - continue home hydroxyurea and folate -f/u CXR  Leukocytosis: no signs of infection. Likely due to stress induced to demargination. -follow up by CBC  Depression:  Stable, no suicidal or homicidal ideations. -Continue home medications: Cymbalta  Migraine HA: no HA now -continue Topamax   DVT ppx: SQ Lovenox Code Status: Full code Family Communication: None at bed side.  Disposition Plan:  Anticipate discharge back to previous home environment Consults called:  none Admission status:  medical floor/obs      Date of Service 01/05/2016    Lorretta HarpIU, Analiese Krupka [facility] Pager [phone]  If 7PM-7AM, please contact night-coverage [email or web address] Password [email or web address] 01/05/2016, 11:33 PM

## 2016-01-05 NOTE — ED Notes (Signed)
FIRST ATTEMPT TO CALL REPORT TO 1327-1.

## 2016-01-06 DIAGNOSIS — D5700 Hb-SS disease with crisis, unspecified: Principal | ICD-10-CM

## 2016-01-06 DIAGNOSIS — F1190 Opioid use, unspecified, uncomplicated: Secondary | ICD-10-CM

## 2016-01-06 DIAGNOSIS — M25551 Pain in right hip: Secondary | ICD-10-CM | POA: Diagnosis not present

## 2016-01-06 DIAGNOSIS — Z832 Family history of diseases of the blood and blood-forming organs and certain disorders involving the immune mechanism: Secondary | ICD-10-CM | POA: Diagnosis not present

## 2016-01-06 DIAGNOSIS — G43909 Migraine, unspecified, not intractable, without status migrainosus: Secondary | ICD-10-CM | POA: Diagnosis present

## 2016-01-06 DIAGNOSIS — Z87891 Personal history of nicotine dependence: Secondary | ICD-10-CM | POA: Diagnosis not present

## 2016-01-06 DIAGNOSIS — F332 Major depressive disorder, recurrent severe without psychotic features: Secondary | ICD-10-CM | POA: Diagnosis present

## 2016-01-06 DIAGNOSIS — R269 Unspecified abnormalities of gait and mobility: Secondary | ICD-10-CM | POA: Diagnosis not present

## 2016-01-06 DIAGNOSIS — Z8489 Family history of other specified conditions: Secondary | ICD-10-CM | POA: Diagnosis not present

## 2016-01-06 DIAGNOSIS — Z888 Allergy status to other drugs, medicaments and biological substances status: Secondary | ICD-10-CM | POA: Diagnosis not present

## 2016-01-06 DIAGNOSIS — G8929 Other chronic pain: Secondary | ICD-10-CM | POA: Diagnosis present

## 2016-01-06 DIAGNOSIS — Y92009 Unspecified place in unspecified non-institutional (private) residence as the place of occurrence of the external cause: Secondary | ICD-10-CM | POA: Diagnosis not present

## 2016-01-06 DIAGNOSIS — T451X6A Underdosing of antineoplastic and immunosuppressive drugs, initial encounter: Secondary | ICD-10-CM | POA: Diagnosis present

## 2016-01-06 DIAGNOSIS — F329 Major depressive disorder, single episode, unspecified: Secondary | ICD-10-CM | POA: Diagnosis not present

## 2016-01-06 DIAGNOSIS — Z79891 Long term (current) use of opiate analgesic: Secondary | ICD-10-CM | POA: Diagnosis not present

## 2016-01-06 DIAGNOSIS — Z9119 Patient's noncompliance with other medical treatment and regimen: Secondary | ICD-10-CM | POA: Diagnosis not present

## 2016-01-06 DIAGNOSIS — Z885 Allergy status to narcotic agent status: Secondary | ICD-10-CM | POA: Diagnosis not present

## 2016-01-06 DIAGNOSIS — Z91048 Other nonmedicinal substance allergy status: Secondary | ICD-10-CM | POA: Diagnosis not present

## 2016-01-06 DIAGNOSIS — F339 Major depressive disorder, recurrent, unspecified: Secondary | ICD-10-CM | POA: Diagnosis not present

## 2016-01-06 LAB — HCG, QUANTITATIVE, PREGNANCY

## 2016-01-06 MED ORDER — SODIUM CHLORIDE 0.9% FLUSH: 9.0000 mL | INTRAVENOUS | Status: DC | PRN

## 2016-01-06 MED ORDER — NALOXONE HCL 0.4 MG/ML IJ SOLN: 0.4000 mg | INTRAMUSCULAR | Status: DC | PRN

## 2016-01-06 MED ORDER — DIPHENHYDRAMINE HCL 25 MG PO CAPS
25.0000 mg | ORAL_CAPSULE | ORAL | Status: DC | PRN
  Administered 2016-01-07: 50 mg via ORAL
  Filled 2016-01-06 (×2): qty 2

## 2016-01-06 MED ORDER — HYDROMORPHONE HCL 2 MG/ML IJ SOLN
2.0000 mg | INTRAMUSCULAR | Status: DC
  Administered 2016-01-06 – 2016-01-09 (×26): 2 mg via INTRAVENOUS
  Filled 2016-01-06 (×26): qty 1

## 2016-01-06 MED ORDER — DIPHENHYDRAMINE HCL 50 MG/ML IJ SOLN
25.0000 mg | INTRAMUSCULAR | Status: DC | PRN
  Administered 2016-01-11 – 2016-01-12 (×5): 25 mg via INTRAVENOUS
  Filled 2016-01-06 (×11): qty 0.5

## 2016-01-06 MED ORDER — ONDANSETRON HCL 4 MG/2ML IJ SOLN: 4.0000 mg | Freq: Four times a day (QID) | INTRAMUSCULAR | Status: DC | PRN

## 2016-01-06 MED ORDER — HYDROMORPHONE 1 MG/ML IV SOLN
INTRAVENOUS | Status: DC
  Administered 2016-01-06 (×2): via INTRAVENOUS
  Administered 2016-01-06: 14 mg via INTRAVENOUS
  Administered 2016-01-07: 9.6 mg via INTRAVENOUS
  Administered 2016-01-07: 3.6 mg via INTRAVENOUS
  Administered 2016-01-07: 7.8 mg via INTRAVENOUS
  Administered 2016-01-07: 9 mg via INTRAVENOUS
  Administered 2016-01-07: 8.4 mg via INTRAVENOUS
  Administered 2016-01-07: 10.8 mg via INTRAVENOUS
  Administered 2016-01-07 – 2016-01-08 (×2): via INTRAVENOUS
  Administered 2016-01-08: 6 mg via INTRAVENOUS
  Administered 2016-01-08: 4.2 mg via INTRAVENOUS
  Administered 2016-01-08: 11.29 mg via INTRAVENOUS
  Administered 2016-01-08: 12 mg via INTRAVENOUS
  Administered 2016-01-08: 09:00:00 via INTRAVENOUS
  Administered 2016-01-08: 7.8 mg via INTRAVENOUS
  Administered 2016-01-09: 9 mg via INTRAVENOUS
  Administered 2016-01-09: 6 mg via INTRAVENOUS
  Administered 2016-01-09: 13.2 mg via INTRAVENOUS
  Administered 2016-01-09: 6.6 mg via INTRAVENOUS
  Administered 2016-01-09: 10.9 mg via INTRAVENOUS
  Administered 2016-01-09 (×2): via INTRAVENOUS
  Administered 2016-01-09: 4.19 mg via INTRAVENOUS
  Administered 2016-01-10: 11.9 mg via INTRAVENOUS
  Administered 2016-01-10: 16:00:00 via INTRAVENOUS
  Administered 2016-01-10: 9 mg via INTRAVENOUS
  Administered 2016-01-10: 16.7 mg via INTRAVENOUS
  Administered 2016-01-10: 06:00:00 via INTRAVENOUS
  Administered 2016-01-10: 10.8 mg via INTRAVENOUS
  Administered 2016-01-10: 7.2 mg via INTRAVENOUS
  Administered 2016-01-10 – 2016-01-11 (×2): 5.4 mg via INTRAVENOUS
  Administered 2016-01-11: 8.4 mg via INTRAVENOUS
  Administered 2016-01-11: 4.2 mg via INTRAVENOUS
  Administered 2016-01-11: 10:00:00 via INTRAVENOUS
  Administered 2016-01-11: 19.8 mg via INTRAVENOUS
  Administered 2016-01-11: 7.2 mg via INTRAVENOUS
  Administered 2016-01-11: 3 mg via INTRAVENOUS
  Administered 2016-01-12: 7.8 mg via INTRAVENOUS
  Administered 2016-01-12: 3 mg via INTRAVENOUS
  Administered 2016-01-12: 5.4 mg via INTRAVENOUS
  Administered 2016-01-12: 9 mg via INTRAVENOUS
  Administered 2016-01-12: 01:00:00 via INTRAVENOUS
  Administered 2016-01-12: 2.4 mg via INTRAVENOUS
  Filled 2016-01-06 (×13): qty 25

## 2016-01-06 NOTE — Telephone Encounter (Signed)
This encounter was created in error - please disregard.

## 2016-01-06 NOTE — Progress Notes (Signed)
Spoke with NP Schorr earlier tonight re: patient's [facility] She stated patient has a specific care plan laid out by the sickle cell clinic and was shared with the ED MD. She went on to say that patient can discuss issues with her medication with her MD in the morning. Will inform patient.

## 2016-01-06 NOTE — Telephone Encounter (Signed)
Pt called back to say that she could not get [facility] and wanted to know if she can come in the morning for treatment. RN told patient that she would have to call first before coming in, pt voiced understanding.

## 2016-01-06 NOTE — Progress Notes (Signed)
SICKLE CELL SERVICE PROGRESS NOTE  Richmond CampbellMiranda Pokorney [id number] DOB: 01/03/1986 DOA: 01/05/2016 PCP: Jeanann LewandowskyJEGEDE, OLUGBEMIGA, MD  Assessment/Plan: Principal Problem:   Sickle cell anemia with crisis (HCC) Active Problems:   Sickle cell pain crisis (HCC)   Depression   Sickle cell crisis (HCC)   Leukocytosis  1. Hb SS with crisis: Place patient on PCA, continue Toradol and change clinician assisted doses to scheduled. Continue IVF at 125 ml/hr. 2. Leukocytosis: No evidence of infection.Likely related to crisis. 3. Anemia of chronic disease: Hb at baseline 4. Chronic pain: Continue MS Contin 5. Chronic Opiate Use: It is my assessment that patient does not show evidence of addiction but does have tolerance to opiates. 6. Medication non-compliance: Pt states that she takes her MS Contin as prescribed on a daily basis and went for more than 1 week without needing Oxycodone. She admits however that she probably takes Hydrea about 1 x week.   Code Status: Full Code Family Communication: N/A Disposition Plan: Not yet ready for discharge  Shondrea Steinert A.  Pager [phone]. If 7PM-7AM, please contact night-coverage.  01/06/2016, 9:55 AM  LOS: 0 days   Interim History: Pt reporting pain 10/10 and localized to hips, back and legs. I have spoken with her PMD who has concernns about addiction in this patient. However she denies addiction to her pain medications and reports that she goes for weeks without taking pain medications. I discussed with patient the limitations in treating resulting symptoms without treating the disease process as she does not take Hydrea. She states that she does not like the way she feels when she takes it but really has not taken it for more than 1 week continuosly. We discussed that some medications may have unpleasant effects initially but that many of these side effects resolve after continuing taking the medication. She reports that she always felt that she was being  penalized for choosing not to take Hydrea but that she now has an understanding that without treating the disease, limits that ability of the Medical Practitioner to provide effective care. She enquired about alternative medications and I explained that I was not versed in alternative measures but advised patient that it would be within her right to choose a more allopathic approach is she chooses however she should discuss this with her PMD. We also spoke about the fact that she would benefit from evaluating her role in previous relationships with her Providers and she agreed that this would be helpful as she was tired of her perceived conflict with the Medical system.   Consultants:  None  Procedures:  None  Antibiotics:  None   Objective: Vitals:   01/05/16 2300 01/06/16 0030 01/06/16 0035 01/06/16 0542  BP: 107/69 (!) 94/54 100/60 (!) 96/53  Pulse: 86 79  79  Resp: 16 16  16   Temp: 98 F (36.7 C) 98.4 F (36.9 C)  98.7 F (37.1 C)  TempSrc:  Oral  Oral  SpO2: 98% 100%  95%  Weight:  57.7 kg (127 lb 3.3 oz)  57.7 kg (127 lb 3.3 oz)  Height:  5' (1.524 m)     Weight change:   Intake/Output Summary (Last 24 hours) at 01/06/16 0955 Last data filed at 01/06/16 0553  Gross per 24 hour  Intake           1052.3 ml  Output                0 ml  Net  1052.3 ml    General: Alert, awake, oriented x3, in severe distress secondary to pain. Unable to ambulate due to pain.  HEENT: [place]/AT PEERL, EOMI, anicteric Neck: Trachea midline,  no masses, no thyromegal,y no JVD, no carotid bruit OROPHARYNX:  Moist, No exudate/ erythema/lesions.  Heart: Regular rate and rhythm, without murmurs, rubs, gallops, PMI non-displaced, no heaves or thrills on palpation.  Lungs: Clear to auscultation, no wheezing or rhonchi noted. No increased vocal fremitus resonant to percussion  Abdomen: Soft, nontender, nondistended, positive bowel sounds, no masses no hepatosplenomegaly noted.  Neuro: No  focal neurological deficits noted cranial nerves II through XII grossly intact.  Strength at baseline in bilateral upper extremities. Unable to evaluate LE's due to pain. Musculoskeletal: No warmth swelling or erythema around joints, no spinal tenderness noted. Psychiatric: Patient alert and oriented x3, good insight and cognition, good recent to remote recall.    Data Reviewed: Basic Metabolic Panel:  Recent Labs Lab 12/31/15 1028 01/05/16 2023  NA 138 138  K 2.9* 3.5  CL 109 109  CO2 23 23  GLUCOSE 114* 114*  BUN 7 11  CREATININE 0.36* 0.47  CALCIUM 8.6* 9.1   Liver Function Tests:  Recent Labs Lab 12/31/15 1028 01/05/16 2023  AST 52* 47*  ALT 35 31  ALKPHOS 72 74  BILITOT 2.8* 2.6*  PROT 7.0 7.7  ALBUMIN 4.0 4.2   No results for input(s): LIPASE, AMYLASE in the last 168 hours. No results for input(s): AMMONIA in the last 168 hours. CBC:  Recent Labs Lab 12/31/15 1028 01/05/16 2023  WBC 10.5 14.9*  NEUTROABS 5.7 8.2*  HGB 7.7* 8.1*  HCT 22.0* 23.4*  MCV 93.2 96.7  PLT 258 258   Cardiac Enzymes: No results for input(s): CKTOTAL, CKMB, CKMBINDEX, TROPONINI in the last 168 hours. BNP (last 3 results) No results for input(s): BNP in the last 8760 hours.  ProBNP (last 3 results) No results for input(s): PROBNP in the last 8760 hours.  CBG: No results for input(s): GLUCAP in the last 168 hours.  No results found for this or any previous visit (from the past 240 hour(s)).   Studies: Dg Chest 2 View  Result Date: 01/05/2016 CLINICAL DATA:  Sickle-cell crisis EXAM: CHEST  2 VIEW COMPARISON:  11/21/2015 FINDINGS: The heart size and mediastinal contours are within normal limits. Right central venous catheter tip is seen at the cavoatrial junction as before. Both lungs are clear. The visualized skeletal structures are unremarkable. Cholecystectomy clips in the right upper quadrant. IMPRESSION: No active cardiopulmonary disease. Electronically Signed   By: Tollie Ethavid   Kwon M.D.   On: 01/05/2016 23:57    Scheduled Meds: . DULoxetine  60 mg Oral Daily  . enoxaparin (LOVENOX) injection  40 mg Subcutaneous Q24H  . folic acid  1 mg Oral Daily  . HYDROmorphone   Intravenous Q4H  .  HYDROmorphone (DILAUDID) injection  2 mg Intravenous Q3H  . hydroxyurea  1,000 mg Oral Daily  . ketorolac  30 mg Intravenous Q6H  . morphine  30 mg Oral Q12H  . senna-docusate  1 tablet Oral BID  . topiramate  50 mg Oral BID   Continuous Infusions: . dextrose 5 % and 0.45% NaCl 125 mL/hr at 01/06/16 0411    Principal Problem:   Sickle cell anemia with crisis [facility](HCC) Active Problems:   Sickle cell pain crisis (HCC)   Depression   Sickle cell crisis (HCC)   Leukocytosis    In excess of 40 minutes spent during  this visit. Greater than 50% involved face to face contact with the patient for assessment, counseling and coordination of care.

## 2016-01-06 NOTE — Progress Notes (Signed)
Patient arrived on the unit at approximately 0005 from the ED accompanied by her daughter who is staying the night. Patient is very abrupt with her responses or do not respond when spoken to. Stated she wants her Dilaudid every 2 hours not 3 hrs or she was going to leave. Has a right CV catheter in place and stated the dressing was changed a week ago. Refused to have same changed when asked. Will  monitor behavior.

## 2016-01-06 NOTE — Progress Notes (Signed)
Patient's Dilaudid PCA initiated at around 1018, witnessed by Gloriajean DellFred H. Vital signs obtained. Will continue to monitor.

## 2016-01-07 NOTE — Care Management Note (Signed)
Case Management Note  Patient Details  Name: Brittany CampbellMiranda Sisneros MRN: [id number] Date of Birth: 09/02/1985  Subjective/Objective:   30 yo admitted with SCC.                 Action/Plan: From home with children. Chart reviewed and CM following for DC needs.  Expected Discharge Date:                  Expected Discharge Plan:  Home/Self Care  In-House Referral:     Discharge planning Services  CM Consult  Post Acute Care Choice:    Choice offered to:     DME Arranged:    DME Agency:     HH Arranged:    HH Agency:     Status of Service:  In process, will continue to follow  If discussed at Long Length of Stay Meetings, dates discussed:    Additional CommentsBartholome Bill:  Adithi Gammon H, RN 01/07/2016, 11:01 AM  [phone]

## 2016-01-07 NOTE — Progress Notes (Signed)
Patient ambulated approximately 50 feet. Limp noted with ambulation and patient c/o severe pain to lower extremities rating at greater than 10. Patient is tearful after ambulation.

## 2016-01-07 NOTE — Progress Notes (Signed)
SICKLE CELL SERVICE PROGRESS NOTE  Brittany Foster NWG:[id number] DOB: 12/28/1985 DOA: 01/05/2016 PCP: Jeanann LewandowskyJEGEDE, OLUGBEMIGA, MD  Assessment/Plan: Principal Problem:   Sickle cell anemia with crisis (HCC) Active Problems:   Sickle cell pain crisis (HCC)   Hb-SS disease with crisis (HCC)   Depression   Sickle cell crisis (HCC)   Leukocytosis  1. Hb SS with crisis: Contiue PCA,Toradol and scheduled clinician assisted doses. Decrease IVF to 75 ml/hr. 2. Leukocytosis: No evidence of infection.Likely related to crisis. 3. Anemia of chronic disease: Hb at baseline 4. Chronic pain: Continue MS Contin 5. Chronic Opiate Use: It is my assessment that patient does not show evidence of addiction but does have tolerance to opiates. 6. Medication non-compliance: Pt states that she takes her MS Contin as prescribed on a daily basis and went for more than 1 week without needing Oxycodone. She admits however that she probably takes Hydrea about 1 x week.   Code Status: Full Code Family Communication: N/A Disposition Plan: Not yet ready for discharge  [name],[name]  Pager [phone]. If 7PM-7AM, please contact night-coverage.  01/07/2016, 4:50 PM  LOS: 1 day   Interim History: Pt reporting pain 8/10 and localized to hips, back and legs. I have spoken with her PMD who has concernns about addiction in this patient. However she denies addiction to her pain medications and reports that she goes for weeks without taking pain medications. I discussed with patient the limitations in treating resulting symptoms without treating the disease process as she does not take Hydrea. She states that she does not like the way she feels when she takes it but really has not taken it for more than 1 week continuosly. We discussed that some medications may have unpleasant effects initially but that many of these side effects resolve after continuing taking the medication. She reports that she always felt that she was being  penalized for choosing not to take Hydrea but that she now has an understanding that without treating the disease, limits that ability of the Medical Practitioner to provide effective care. She enquired about alternative medications and I explained that I was not versed in alternative measures but advised patient that it would be within her right to choose a more allopathic approach is she chooses however she should discuss this with her PMD. We also spoke about the fact that she would benefit from evaluating her role in previous relationships with her Providers and she agreed that this would be helpful as she was tired of her perceived conflict with the Medical system.   Consultants:  None  Procedures:  None  Antibiotics:  None   Objective: Vitals:   01/07/16 0952 01/07/16 1235 01/07/16 1345 01/07/16 1600  BP: 95/68  (!) 93/51   Pulse: 75  89   Resp:  12 16 14   Temp:   98.6 F (37 C)   TempSrc:   Oral   SpO2:  97% 97% 96%  Weight:      Height:       Weight change:   Intake/Output Summary (Last 24 hours) at 01/07/16 1650 Last data filed at 01/07/16 0950  Gross per 24 hour  Intake              440 ml  Output                0 ml  Net              440 ml    General: Alert, awake,  oriented x3, in severe distress secondary to pain. Difficulty ambulating due to pain.  HEENT: [place]/AT PEERL, EOMI, anicteric Neck: Trachea midline,  no masses, no thyromegal,y no JVD, no carotid bruit OROPHARYNX:  Moist, No exudate/ erythema/lesions.  Heart: Regular rate and rhythm, without murmurs, rubs, gallops, PMI non-displaced, no heaves or thrills on palpation.  Lungs: Clear to auscultation, no wheezing or rhonchi noted. No increased vocal fremitus resonant to percussion  Abdomen: Soft, nontender, nondistended, positive bowel sounds, no masses no hepatosplenomegaly noted.  Neuro: No focal neurological deficits noted cranial nerves II through XII grossly intact.  Strength at baseline in bilateral  upper extremities. Unable to evaluate LE's due to pain. Musculoskeletal: No warmth swelling or erythema around joints, no spinal tenderness noted. Psychiatric: Patient alert and oriented x3, good insight and cognition, good recent to remote recall.   Data Reviewed: Basic Metabolic Panel:  Recent Labs Lab 01/05/16 2023  NA 138  K 3.5  CL 109  CO2 23  GLUCOSE 114*  BUN 11  CREATININE 0.47  CALCIUM 9.1   Liver Function Tests:  Recent Labs Lab 01/05/16 2023  AST 47*  ALT 31  ALKPHOS 74  BILITOT 2.6*  PROT 7.7  ALBUMIN 4.2   No results for input(s): LIPASE, AMYLASE in the last 168 hours. No results for input(s): AMMONIA in the last 168 hours. CBC:  Recent Labs Lab 01/05/16 2023  WBC 14.9*  NEUTROABS 8.2*  HGB 8.1*  HCT 23.4*  MCV 96.7  PLT 258   Cardiac Enzymes: No results for input(s): CKTOTAL, CKMB, CKMBINDEX, TROPONINI in the last 168 hours. BNP (last 3 results) No results for input(s): BNP in the last 8760 hours.  ProBNP (last 3 results) No results for input(s): PROBNP in the last 8760 hours.  CBG: No results for input(s): GLUCAP in the last 168 hours.  No results found for this or any previous visit (from the past 240 hour(s)).   Studies: Dg Chest 2 View  Result Date: 01/05/2016 CLINICAL DATA:  Sickle-cell crisis EXAM: CHEST  2 VIEW COMPARISON:  11/21/2015 FINDINGS: The heart size and mediastinal contours are within normal limits. Right central venous catheter tip is seen at the cavoatrial junction as before. Both lungs are clear. The visualized skeletal structures are unremarkable. Cholecystectomy clips in the right upper quadrant. IMPRESSION: No active cardiopulmonary disease. Electronically Signed   By: Tollie Ethavid  Kwon M.D.   On: 01/05/2016 23:57    Scheduled Meds: . DULoxetine  60 mg Oral Daily  . enoxaparin (LOVENOX) injection  40 mg Subcutaneous Q24H  . folic acid  1 mg Oral Daily  . HYDROmorphone   Intravenous Q4H  .  HYDROmorphone (DILAUDID)  injection  2 mg Intravenous Q3H  . hydroxyurea  1,000 mg Oral Daily  . ketorolac  30 mg Intravenous Q6H  . morphine  30 mg Oral Q12H  . senna-docusate  1 tablet Oral BID  . topiramate  50 mg Oral BID   Continuous Infusions: . dextrose 5 % and 0.45% NaCl 125 mL/hr at 01/07/16 1234    Principal Problem:   Sickle cell anemia with crisis [facility](HCC) Active Problems:   Sickle cell pain crisis (HCC)   Hb-SS disease with crisis (HCC)   Depression   Sickle cell crisis (HCC)   Leukocytosis    In excess of 40 minutes spent during this visit. Greater than 50% involved face to face contact with the patient for assessment, counseling and coordination of care.

## 2016-01-08 DIAGNOSIS — Z9119 Patient's noncompliance with other medical treatment and regimen: Secondary | ICD-10-CM

## 2016-01-08 LAB — CBC WITH DIFFERENTIAL/PLATELET
BASOS ABS: 0 10*3/uL (ref 0.0–0.1)
BASOS PCT: 0 %
EOS ABS: 0.6 10*3/uL (ref 0.0–0.7)
Eosinophils Relative: 6 %
HCT: 21.8 % — ABNORMAL LOW (ref 36.0–46.0)
Hemoglobin: 7.6 g/dL — ABNORMAL LOW (ref 12.0–15.0)
LYMPHS ABS: 3.7 10*3/uL (ref 0.7–4.0)
LYMPHS PCT: 36 %
MCH: 33.9 pg (ref 26.0–34.0)
MCHC: 34.9 g/dL (ref 30.0–36.0)
MCV: 97.3 fL (ref 78.0–100.0)
MONO ABS: 0.7 10*3/uL (ref 0.1–1.0)
Monocytes Relative: 7 %
NEUTROS PCT: 51 %
Neutro Abs: 5.4 10*3/uL (ref 1.7–7.7)
PLATELETS: 254 10*3/uL (ref 150–400)
RBC: 2.24 MIL/uL — AB (ref 3.87–5.11)
RDW: 21.4 % — AB (ref 11.5–15.5)
WBC: 10.4 10*3/uL (ref 4.0–10.5)
nRBC: 2 /100 WBC — ABNORMAL HIGH

## 2016-01-08 LAB — BASIC METABOLIC PANEL
Anion gap: 3 — ABNORMAL LOW (ref 5–15)
BUN: 5 mg/dL — AB (ref 6–20)
CHLORIDE: 113 mmol/L — AB (ref 101–111)
CO2: 23 mmol/L (ref 22–32)
CREATININE: 0.46 mg/dL (ref 0.44–1.00)
Calcium: 8.5 mg/dL — ABNORMAL LOW (ref 8.9–10.3)
GFR calc Af Amer: >60 mL/min (ref >60)
GLUCOSE: 103 mg/dL — AB (ref 65–99)
POTASSIUM: 3.5 mmol/L (ref 3.5–5.1)
Sodium: 139 mmol/L (ref 135–145)

## 2016-01-08 LAB — RETICULOCYTES
RBC.: 2.24 MIL/uL — ABNORMAL LOW (ref 3.87–5.11)
RETIC COUNT ABSOLUTE: 409.9 10*3/uL — AB (ref 19.0–186.0)
RETIC CT PCT: 18.3 % — AB (ref 0.4–3.1)

## 2016-01-08 NOTE — Progress Notes (Signed)
SICKLE CELL SERVICE PROGRESS NOTE  Brittany Foster RUE:[id number]:[id number] DOB: 10/11/1985 DOA: 01/05/2016 PCP: Jeanann LewandowskyJEGEDE, OLUGBEMIGA, MD  Assessment/Plan: Principal Problem:   Sickle cell anemia with crisis (HCC) Active Problems:   Sickle cell pain crisis (HCC)   Hb-SS disease with crisis (HCC)   Depression   Sickle cell crisis (HCC)   Leukocytosis  1. Hb SS with crisis: Continue PCA,Toradol and scheduled clinician assisted doses. [facility] 2. Leukocytosis: No evidence of infection.Likely related to crisis. 3. Anemia of chronic disease: Labs from today pending. 4. Chronic pain: Continue MS Contin 5. Chronic Opiate Use: It is my assessment that patient does not presently show evidence of addiction but does have tolerance to opiates. However addiction is difficult to fully assess in an acute care setting.  6. Clinical Depression: Will ask Psychiatry to see to evaluate need for medication adjustment. 7. Medication non-compliance: Pt states that she takes her MS Contin as prescribed on a daily basis and went for more than 1 week without needing Oxycodone. She admits however that she probably takes Hydrea about 1 x week.   Code Status: Full Code Family Communication: N/A Disposition Plan: anticipate discharge in 48 hours.  [name]  Pager [phone]. If 7PM-7AM, please contact night-coverage.  01/08/2016, 11:27 AM  LOS: 2 days   Interim History: Pt reporting pain 8/10 and localized to hips, back and legs. I have had a discussion with patient and barring any medical developments that require hospitalization. We have agreed to start weaning medications tomorrow and discharge in 48 hours. I have spoken with patient at length about her utilization of ER's and her refusal to treat her disease in the setting of out of control symptom management. I have discussed a referral to a center that is involved in research to consider some of the developing drugs studies targeted to the inflammatory  process. [facility]  Today she rates her pain as 7/10. She has used 49 mg on the PCA in the last 24 hours.    Consultants:  None  Procedures:  None  Antibiotics:  None   Objective: Vitals:   01/08/16 0324 01/08/16 0600 01/08/16 0904 01/08/16 0942  BP:  94/61  (!) 83/46  Pulse:  88  73  Resp: 14 13 15 18   Temp:  98.5 F (36.9 C)  98.6 F (37 C)  TempSrc:  Oral  Oral  SpO2: 95% 96% 95% 93%  Weight:  61.5 kg (135 lb 9.3 oz)    Height:       Weight change:   Intake/Output Summary (Last 24 hours) at 01/08/16 1127 Last data filed at 01/08/16 [id number]  Gross per 24 hour  Intake          1819.98 ml  Output                0 ml  Net          1819.98 ml    General: Alert, awake, oriented x3, in mild distress secondary to pain. Difficulty ambulating due to pain.  HEENT: [place]/AT PEERL, EOMI, anicteric OROPHARYNX:  Moist, No exudate/ erythema/lesions.  Heart: Regular rate and rhythm, without murmurs, rubs, gallops, PMI non-displaced, no heaves or thrills on palpation.  Lungs: Clear to auscultation, no wheezing or rhonchi noted. No increased vocal fremitus resonant to percussion  Abdomen: Soft, nontender, nondistended, positive bowel sounds, no masses no hepatosplenomegaly noted.  Neuro: No focal neurological deficits noted cranial nerves II through XII grossly  intact.  Strength at baseline in bilateral upper extremities. Unable to evaluate LE's due to pain. Musculoskeletal: No warmth swelling or erythema around joints, no spinal tenderness noted. Psychiatric: Patient alert and oriented x3, informal questioning about depression reveals a high risk of uncontrolled depression.   Data Reviewed: Basic Metabolic Panel:  Recent Labs Lab 01/05/16 2023  NA 138  K 3.5  CL 109  CO2 23  GLUCOSE 114*  BUN 11  CREATININE 0.47  CALCIUM 9.1   Liver Function Tests:  Recent Labs Lab 01/05/16 2023  AST 47*  ALT 31  ALKPHOS 74  BILITOT 2.6*   PROT 7.7  ALBUMIN 4.2   No results for input(s): LIPASE, AMYLASE in the last 168 hours. No results for input(s): AMMONIA in the last 168 hours. CBC:  Recent Labs Lab 01/05/16 2023 01/08/16 1110  WBC 14.9* 10.4  NEUTROABS 8.2* PENDING  HGB 8.1* 7.6*  HCT 23.4* 21.8*  MCV 96.7 97.3  PLT 258 254   Cardiac Enzymes: No results for input(s): CKTOTAL, CKMB, CKMBINDEX, TROPONINI in the last 168 hours. BNP (last 3 results) No results for input(s): BNP in the last 8760 hours.  ProBNP (last 3 results) No results for input(s): PROBNP in the last 8760 hours.  CBG: No results for input(s): GLUCAP in the last 168 hours.  No results found for this or any previous visit (from the past 240 hour(s)).   Studies: Dg Chest 2 View  Result Date: 01/05/2016 CLINICAL DATA:  Sickle-cell crisis EXAM: CHEST  2 VIEW COMPARISON:  11/21/2015 FINDINGS: The heart size and mediastinal contours are within normal limits. Right central venous catheter tip is seen at the cavoatrial junction as before. Both lungs are clear. The visualized skeletal structures are unremarkable. Cholecystectomy clips in the right upper quadrant. IMPRESSION: No active cardiopulmonary disease. Electronically Signed   By: Tollie Ethavid  Kwon M.D.   On: 01/05/2016 23:57    Scheduled Meds: . DULoxetine  60 mg Oral Daily  . enoxaparin (LOVENOX) injection  40 mg Subcutaneous Q24H  . folic acid  1 mg Oral Daily  . HYDROmorphone   Intravenous Q4H  .  HYDROmorphone (DILAUDID) injection  2 mg Intravenous Q3H  . hydroxyurea  1,000 mg Oral Daily  . ketorolac  30 mg Intravenous Q6H  . morphine  30 mg Oral Q12H  . senna-docusate  1 tablet Oral BID  . topiramate  50 mg Oral BID   Continuous Infusions: . dextrose 5 % and 0.45% NaCl 75 mL/hr at 01/08/16 [id number]    Principal Problem:   Sickle cell anemia with crisis [facility](HCC) Active Problems:   Sickle cell pain crisis (HCC)   Hb-SS disease with crisis (HCC)   Depression   Sickle cell crisis (HCC)    Leukocytosis    In excess of 25 minutes spent during this visit. Greater than 50% involved face to face contact with the patient for assessment, counseling and coordination of care.

## 2016-01-09 DIAGNOSIS — R269 Unspecified abnormalities of gait and mobility: Secondary | ICD-10-CM

## 2016-01-09 DIAGNOSIS — M25552 Pain in left hip: Secondary | ICD-10-CM

## 2016-01-09 DIAGNOSIS — F339 Major depressive disorder, recurrent, unspecified: Secondary | ICD-10-CM

## 2016-01-09 DIAGNOSIS — M25551 Pain in right hip: Secondary | ICD-10-CM

## 2016-01-09 MED ORDER — OXYCODONE HCL 5 MG PO TABS
5.0000 mg | ORAL_TABLET | ORAL | Status: DC
  Administered 2016-01-09 – 2016-01-12 (×19): 5 mg via ORAL
  Filled 2016-01-09 (×19): qty 1

## 2016-01-09 MED ORDER — SODIUM CHLORIDE 0.9% FLUSH: 10.0000 mL | INTRAVENOUS | Status: DC | PRN

## 2016-01-09 MED ORDER — OXYCODONE-ACETAMINOPHEN 5-325 MG PO TABS
1.0000 | ORAL_TABLET | ORAL | Status: DC
  Administered 2016-01-09 – 2016-01-12 (×19): 1 via ORAL
  Filled 2016-01-09 (×19): qty 1

## 2016-01-09 NOTE — Progress Notes (Signed)
SICKLE CELL SERVICE PROGRESS NOTE  Brittany CampbellMiranda Foster ZOX:[id number] DOB: 07/15/1985 DOA: 01/05/2016 PCP: Brittany LewandowskyJEGEDE, OLUGBEMIGA, MD  Assessment/Plan: Principal Problem:   Sickle cell anemia with crisis (HCC) Active Problems:   Sickle cell pain crisis (HCC)   Hb-SS disease with crisis (HCC)   Depression   Sickle cell crisis (HCC)   Leukocytosis  1. Hb SS with crisis: Continue PCA,Toradol and scheduled clinician assisted doses. Discontinue clinician assisted doses.  [facility]. 2. Leukocytosis: No evidence of infection.Likely related to crisis. 3. Anemia of chronic disease: Labs from today pending. 4. Chronic pain: Continue MS Contin 5. Chronic Opiate Use: It is my assessment that patient does not presently show evidence of addiction but does have tolerance to opiates. However addiction is difficult to fully assess in an acute care setting. She consistently c/oo hip pains and there has only been plain films but no further evaluation.  6. Clinical Depression: Pt has scored 22/27 on PHQ-9. Consult to Psychiatry pending. Pt has been prescribed Cymbalta but acknowledges that she is not adherent to taking it as prescribed. She states that she takes it occasionally.  7. Medication non-compliance: Pt states that she takes her MS Contin as prescribed on a daily basis and went for more than 1 week without needing Oxycodone. She admits however that she probably takes Hydrea about 1 x week.  8. B/L hip pain and Gait Abnormality: Pt having difficulty ambulating due to b/l hip pain. Last hips x-rays 03/11/2015 showed pelvic calcifications consistent with phleboliths. This would be an unusual presentation of AVN as the sole findings however plain films lack both sensitivity and/specificity. I have discussed with Brittany Foster-Radiology and will obtain MRI without contrast to further evaluate.  Code Status: Full Code Family Communication: N/A Disposition Plan: anticipate discharge in 48  hours.  Brittany Foster A.  Pager [phone]. If 7PM-7AM, please contact night-coverage.  01/09/2016, 4:11 PM  LOS: 3 days   Interim History: Pt reporting pain 8/10 and localized to hips, back and legs. I have had a discussion with patient and barring any medical developments that require hospitalization. We have agreed to start weaning medications tomorrow and discharge in 48 hours. I have spoken with patient at length about her utilization of ER's and her refusal to treat her disease in the setting of out of control symptom management. I have discussed a referral to a center that is involved in research to consider some of the developing drugs studies targeted to the inflammatory process. These centers [facility]  Today she rates her pain as 7/10. She has used 51 mg on the PCA in the last 24 hours.    Consultants:  None  Procedures:  None  Antibiotics:  None   Objective: Vitals:   01/09/16 1024 01/09/16 1217 01/09/16 1414 01/09/16 1512  BP: 99/63  (!) 92/59   Pulse: 85  90   Resp: 15 11 11 15   Temp: 98.2 F (36.8 C)  98.2 F (36.8 C)   TempSrc: Oral  Oral   SpO2: 95% 94% 93% 95%  Weight:      Height:       Weight change: -0.9 kg (-1 lb 15.8 oz)  Intake/Output Summary (Last 24 hours) at 01/09/16 1611 Last data filed at 01/08/16 1844  Gross per 24 hour  Intake              250 ml  Output  0 ml  Net              250 ml    General: Alert, awake, oriented x3, in mild distress secondary to pain. Difficulty ambulating due to pain.  HEENT: [place]/AT PEERL, EOMI, anicteric OROPHARYNX:  Moist, No exudate/ erythema/lesions.  Heart: Regular rate and rhythm, without murmurs, rubs, gallops, PMI non-displaced, no heaves or thrills on palpation.  Lungs: Clear to auscultation, no wheezing or rhonchi noted. No increased vocal fremitus resonant to percussion  Abdomen: Soft, nontender, nondistended, positive bowel sounds, no masses no  hepatosplenomegaly noted.  Neuro: No focal neurological deficits noted cranial nerves II through XII grossly intact.  Strength at baseline in bilateral upper extremities. Unable to evaluate LE's due to pain. Musculoskeletal: No warmth swelling or erythema around joints, no spinal tenderness noted. Psychiatric: Patient alert and oriented x3, informal questioning about depression reveals a high risk of uncontrolled depression.   Data Reviewed: Basic Metabolic Panel:  Recent Labs Lab 01/05/16 2023 01/08/16 1110  NA 138 139  K 3.5 3.5  CL 109 113*  CO2 23 23  GLUCOSE 114* 103*  BUN 11 5*  CREATININE 0.47 0.46  CALCIUM 9.1 8.5*   Liver Function Tests:  Recent Labs Lab 01/05/16 2023  AST 47*  ALT 31  ALKPHOS 74  BILITOT 2.6*  PROT 7.7  ALBUMIN 4.2   No results for input(s): LIPASE, AMYLASE in the last 168 hours. No results for input(s): AMMONIA in the last 168 hours. CBC:  Recent Labs Lab 01/05/16 2023 01/08/16 1110  WBC 14.9* 10.4  NEUTROABS 8.2* 5.4  HGB 8.1* 7.6*  HCT 23.4* 21.8*  MCV 96.7 97.3  PLT 258 254   Cardiac Enzymes: No results for input(s): CKTOTAL, CKMB, CKMBINDEX, TROPONINI in the last 168 hours. BNP (last 3 results) No results for input(s): BNP in the last 8760 hours.  ProBNP (last 3 results) No results for input(s): PROBNP in the last 8760 hours.  CBG: No results for input(s): GLUCAP in the last 168 hours.  No results found for this or any previous visit (from the past 240 hour(s)).   Studies: Dg Chest 2 View  Result Date: 01/05/2016 CLINICAL DATA:  Sickle-cell crisis EXAM: CHEST  2 VIEW COMPARISON:  11/21/2015 FINDINGS: The heart size and mediastinal contours are within normal limits. Right central venous catheter tip is seen at the cavoatrial junction as before. Both lungs are clear. The visualized skeletal structures are unremarkable. Cholecystectomy clips in the right upper quadrant. IMPRESSION: No active cardiopulmonary disease.  Electronically Signed   By: Brittany Ethavid  Foster M.D.   On: 01/05/2016 23:57    Scheduled Meds: . DULoxetine  60 mg Oral Daily  . enoxaparin (LOVENOX) injection  40 mg Subcutaneous Q24H  . folic acid  1 mg Oral Daily  . HYDROmorphone   Intravenous Q4H  . hydroxyurea  1,000 mg Oral Daily  . ketorolac  30 mg Intravenous Q6H  . morphine  30 mg Oral Q12H  . oxyCODONE-acetaminophen  1 tablet Oral Q4H   And  . oxyCODONE  5 mg Oral Q4H  . senna-docusate  1 tablet Oral BID  . topiramate  50 mg Oral BID   Continuous Infusions: . dextrose 5 % and 0.45% NaCl 10 mL/hr at 01/08/16 1314    Principal Problem:   Sickle cell anemia with crisis [facility](HCC) Active Problems:   Sickle cell pain crisis (HCC)   Hb-SS disease with crisis (HCC)   Depression   Sickle cell crisis (HCC)  Leukocytosis    In excess of 25 minutes spent during this visit. Greater than 50% involved face to face contact with the patient for assessment, counseling and coordination of care.

## 2016-01-10 DIAGNOSIS — Z888 Allergy status to other drugs, medicaments and biological substances status: Secondary | ICD-10-CM

## 2016-01-10 DIAGNOSIS — F332 Major depressive disorder, recurrent severe without psychotic features: Secondary | ICD-10-CM

## 2016-01-10 DIAGNOSIS — Z8489 Family history of other specified conditions: Secondary | ICD-10-CM

## 2016-01-10 DIAGNOSIS — Z87891 Personal history of nicotine dependence: Secondary | ICD-10-CM

## 2016-01-10 DIAGNOSIS — Z79899 Other long term (current) drug therapy: Secondary | ICD-10-CM

## 2016-01-10 LAB — CBC WITH DIFFERENTIAL/PLATELET
BASOS PCT: 0 %
Basophils Absolute: 0 10*3/uL (ref 0.0–0.1)
EOS PCT: 4 %
Eosinophils Absolute: 0.4 10*3/uL (ref 0.0–0.7)
HEMATOCRIT: 20.6 % — AB (ref 36.0–46.0)
Hemoglobin: 7.1 g/dL — ABNORMAL LOW (ref 12.0–15.0)
LYMPHS ABS: 3.1 10*3/uL (ref 0.7–4.0)
Lymphocytes Relative: 34 %
MCH: 33.6 pg (ref 26.0–34.0)
MCHC: 34.5 g/dL (ref 30.0–36.0)
MCV: 97.6 fL (ref 78.0–100.0)
MONO ABS: 0.7 10*3/uL (ref 0.1–1.0)
MONOS PCT: 8 %
Neutro Abs: 4.9 10*3/uL (ref 1.7–7.7)
Neutrophils Relative %: 54 %
PLATELETS: 274 10*3/uL (ref 150–400)
RBC: 2.11 MIL/uL — AB (ref 3.87–5.11)
RDW: 22.1 % — AB (ref 11.5–15.5)
WBC: 9.1 10*3/uL (ref 4.0–10.5)

## 2016-01-10 LAB — RETICULOCYTES
RBC.: 2.11 MIL/uL — ABNORMAL LOW (ref 3.87–5.11)
RETIC COUNT ABSOLUTE: 375.6 10*3/uL — AB (ref 19.0–186.0)
Retic Ct Pct: 17.8 % — ABNORMAL HIGH (ref 0.4–3.1)

## 2016-01-10 MED ORDER — ARIPIPRAZOLE 5 MG PO TABS
5.0000 mg | ORAL_TABLET | Freq: Two times a day (BID) | ORAL | Status: DC
  Administered 2016-01-10 – 2016-01-12 (×5): 5 mg via ORAL
  Filled 2016-01-10 (×6): qty 1

## 2016-01-10 NOTE — Progress Notes (Signed)
SICKLE CELL SERVICE PROGRESS NOTE  Brittany CampbellMiranda Foster BJY:[id number] DOB: 10/07/1985 DOA: 01/05/2016 PCP: Brittany LewandowskyJEGEDE, OLUGBEMIGA, MD  Assessment/Plan: Principal Problem:   MDD (major depressive disorder), recurrent severe, without psychosis (HCC) Active Problems:   Sickle cell pain crisis (HCC)   Hb-SS disease with crisis (HCC)   Depression   Sickle cell crisis (HCC)   Leukocytosis   Sickle cell anemia with crisis (HCC)  1. Hb SS with crisis: Continue PCA,Toradol and scheduled clinician assisted doses. [facility]. 2. B/L Hip Pain: Pt at risk for AVN. Previous X-ray of hips showed Phleboliths which could be an indication of AVN. I have discussed this with Radiologist Dr. Park Foster and MRI of B/L hips ordered. Radiology was unable to do tomorrow and she is scheduled to have it done tomorrow.  3. Leukocytosis: No evidence of infection.Likely related to crisis. 4. Anemia of chronic disease: Hb stable. 5. Chronic pain: Continue MS Contin 6. Chronic Opiate Use: It is my assessment that patient does not presently show evidence of addiction but does have tolerance to opiates. However addiction is difficult to fully assess in an acute care setting.  7. Clinical Depression: Pt seen by Psychiatry and Abilify added to medications 8. Gait Abnormality: Due to hip pain. Appreceiate PT input.  9. Medication non-compliance: Pt states that she takes her MS Contin as prescribed on a daily basis and went for more than 1 week without needing Oxycodone. She admits however that she probably takes Hydrea about 1 x week.   Code Status: Full Code Family Communication: N/A Disposition Plan: anticipate discharge in 48 hours.  Brittany Foster A.  Pager [phone]. If 7PM-7AM, please contact night-coverage.  01/10/2016, 5:24 PM  LOS: 4 days   Interim History: Pt reporting pain 8/10 and localized to hips, back and legs. I have had a discussion with patient and barring any medical developments that require  hospitalization. We have agreed to start weaning medications tomorrow and discharge in 48 hours. I have spoken with patient at length about her utilization of ER's and her refusal to treat her disease in the setting of out of control symptom management. I have discussed a referral to a center that is involved in research to consider some of the developing drugs studies targeted to the inflammatory process. These centers around here include [facility], [facility] and [facility].  Today she rates her pain as 7/10. She has used 54.5 mg on the PCA in the last 24 hours.    Consultants:  None  Procedures:  None  Antibiotics:  None   Objective: Vitals:   01/10/16 0930 01/10/16 1319 01/10/16 1447 01/10/16 1640  BP: 96/61  98/60   Pulse: 88  91   Resp: 13 14 11 12   Temp: 98.7 F (37.1 C)  97 F (36.1 C)   TempSrc: Oral  Oral   SpO2: 97% 94% 94% 95%  Weight:      Height:       Weight change:   Intake/Output Summary (Last 24 hours) at 01/10/16 1724 Last data filed at 01/10/16 0603  Gross per 24 hour  Intake          1145.71 ml  Output                0 ml  Net          1145.71 ml    General: Alert, awake, oriented x3, in mild distress secondary to pain. Difficulty ambulating due to pain, but has been getting up into the chair. HEENT:  [place]/AT PEERL, EOMI, anicteric OROPHARYNX:  Moist, No exudate/ erythema/lesions.  Heart: Regular rate and rhythm, without murmurs, rubs, gallops, PMI non-displaced, no heaves or thrills on palpation.  Lungs: Clear to auscultation, no wheezing or rhonchi noted. No increased vocal fremitus resonant to percussion  Abdomen: Soft, nontender, nondistended, positive bowel sounds, no masses no hepatosplenomegaly noted.  Neuro: No focal neurological deficits noted cranial nerves II through XII grossly intact.  Strength at baseline in bilateral upper extremities. Unable to evaluate LE's due to pain. Musculoskeletal: No warmth swelling or erythema around joints, no spinal tenderness  noted. Psychiatric: Patient alert and oriented x3, informal questioning about depression reveals a high risk of uncontrolled depression.   Data Reviewed: Basic Metabolic Panel:  Recent Labs Lab 01/05/16 2023 01/08/16 1110  NA 138 139  K 3.5 3.5  CL 109 113*  CO2 23 23  GLUCOSE 114* 103*  BUN 11 5*  CREATININE 0.47 0.46  CALCIUM 9.1 8.5*   Liver Function Tests:  Recent Labs Lab 01/05/16 2023  AST 47*  ALT 31  ALKPHOS 74  BILITOT 2.6*  PROT 7.7  ALBUMIN 4.2   No results for input(s): LIPASE, AMYLASE in the last 168 hours. No results for input(s): AMMONIA in the last 168 hours. CBC:  Recent Labs Lab 01/05/16 2023 01/08/16 1110 01/10/16 1214  WBC 14.9* 10.4 9.1  NEUTROABS 8.2* 5.4 4.9  HGB 8.1* 7.6* 7.1*  HCT 23.4* 21.8* 20.6*  MCV 96.7 97.3 97.6  PLT 258 254 274   Cardiac Enzymes: No results for input(s): CKTOTAL, CKMB, CKMBINDEX, TROPONINI in the last 168 hours. BNP (last 3 results) No results for input(s): BNP in the last 8760 hours.  ProBNP (last 3 results) No results for input(s): PROBNP in the last 8760 hours.  CBG: No results for input(s): GLUCAP in the last 168 hours.  No results found for this or any previous visit (from the past 240 hour(s)).   Studies: Dg Chest 2 View  Result Date: 01/05/2016 CLINICAL DATA:  Sickle-cell crisis EXAM: CHEST  2 VIEW COMPARISON:  11/21/2015 FINDINGS: The heart size and mediastinal contours are within normal limits. Right central venous catheter tip is seen at the cavoatrial junction as before. Both lungs are clear. The visualized skeletal structures are unremarkable. Cholecystectomy clips in the right upper quadrant. IMPRESSION: No active cardiopulmonary disease. Electronically Signed   By: Tollie Ethavid  Kwon M.D.   On: 01/05/2016 23:57    Scheduled Meds: . ARIPiprazole  5 mg Oral BID  . DULoxetine  60 mg Oral Daily  . enoxaparin (LOVENOX) injection  40 mg Subcutaneous Q24H  . folic acid  1 mg Oral Daily  .  HYDROmorphone   Intravenous Q4H  . hydroxyurea  1,000 mg Oral Daily  . ketorolac  30 mg Intravenous Q6H  . morphine  30 mg Oral Q12H  . oxyCODONE-acetaminophen  1 tablet Oral Q4H   And  . oxyCODONE  5 mg Oral Q4H  . senna-docusate  1 tablet Oral BID  . topiramate  50 mg Oral BID   Continuous Infusions: . dextrose 5 % and 0.45% NaCl 10 mL/hr at 01/08/16 1314    Principal Problem:   MDD (major depressive disorder), recurrent severe, without psychosis (HCC) Active Problems:   Sickle cell pain crisis (HCC)   Hb-SS disease with crisis (HCC)   Depression   Sickle cell crisis (HCC)   Leukocytosis   Sickle cell anemia with crisis (HCC)    In excess of 25 minutes spent during this visit.  Greater than 50% involved face to face contact with the patient for assessment, counseling and coordination of care.

## 2016-01-10 NOTE — Evaluation (Signed)
Physical Therapy Evaluation Patient Details Name: Brittany Foster Perkin MRN: [id number] DOB: 06/05/1985 Today's Date: 01/10/2016   History of Present Illness  Pt is a 30 year old female admitted with Hb SS with crisis  Clinical Impression  Pt admitted with above diagnosis. Pt currently with functional limitations due to the deficits listed below (see PT Problem List). Pt will benefit from skilled PT to increase their independence and safety with mobility to allow discharge to the venue listed below.  Pt with increased pain during mobility and only tolerated 24 feet of ambulation with RW.     Follow Up Recommendations No PT follow up (would recommend assist for stairs at home, depending on progress)    Equipment Recommendations  Rolling walker with 5" wheels    Recommendations for Other Services       Precautions / Restrictions Precautions Precautions: None      Mobility  Bed Mobility               General bed mobility comments: pt up in recliner on arrival  Transfers Overall transfer level: Needs assistance Equipment used: Rolling walker (2 wheeled) Transfers: Sit to/from Stand Sit to Stand: Min guard         General transfer comment: verbal cues for safe technique  Ambulation/Gait Ambulation/Gait assistance: Min guard Ambulation Distance (Feet): 24 Feet Assistive device: Rolling walker (2 wheeled) Gait Pattern/deviations: Step-through pattern;Decreased stride length;Narrow base of support     General Gait Details: very small steps, stiff gait, limited distance due to increase to 10/10 pain with pt crying  Stairs            Wheelchair Mobility    Modified Rankin (Stroke Patients Only)       Balance                                             Pertinent Vitals/Pain Pain Assessment: 0-10 Pain Score: 7  Pain Location: back and bil hips Pain Descriptors / Indicators: Aching;Constant Pain Intervention(s): Limited activity within  patient's tolerance;Monitored during session;Premedicated before session;PCA encouraged;Repositioned    Home Living Family/patient expects to be discharged to:: Private residence Living Arrangements: Children     Home Access: Stairs to enter Entrance Stairs-Rails: None Secretary/administratorntrance Stairs-Number of Steps: flight Home Layout: One level Home Equipment: None      Prior Function Level of Independence: Independent               Hand Dominance        Extremity/Trunk Assessment               Lower Extremity Assessment: RLE deficits/detail;LLE deficits/detail RLE Deficits / Details: stiff movements, able to perform tasks with increased time       Communication   Communication: No difficulties  Cognition Arousal/Alertness: Awake/alert Behavior During Therapy: WFL for tasks assessed/performed Overall Cognitive Status: Within Functional Limits for tasks assessed                      General Comments      Exercises     Assessment/Plan    PT Assessment Patient needs continued PT services  PT Problem List Decreased activity tolerance;Decreased mobility;Pain;Decreased knowledge of use of DME          PT Treatment Interventions DME instruction;Gait training;Stair training;Functional mobility training;Therapeutic activities;Therapeutic exercise;Patient/family education    PT Goals (Current  goals can be found in the Care Plan section)  Acute Rehab PT Goals PT Goal Formulation: With patient Time For Goal Achievement: 01/17/16 Potential to Achieve Goals: Good    Frequency Min 3X/week   Barriers to discharge        Co-evaluation               End of Session   Activity Tolerance: Patient limited by pain Patient left: in chair;with call bell/phone within reach Nurse Communication: Mobility status (RN observed pt ambulate into hallway)         Time: [id number] PT Time Calculation (min) (ACUTE ONLY): 12 min   Charges:   PT Evaluation $PT  Eval Low Complexity: 1 Procedure     PT G Codes:        Ewald Beg,KATHrine E 01/10/2016, 2:23 PM Zenovia JarredKati Arlett Goold, PT, DPT 01/10/2016 Pager: [phone]

## 2016-01-10 NOTE — Consult Note (Signed)
[facility] Face-to-Face Psychiatry Consult   Reason for Consult:  Depression and acute pain due to sickle cell crisis  Referring Physician:  Dr. Zigmund Daniel Patient Identification: Brittany Foster MRN:  [id number] Principal Diagnosis: MDD (major depressive disorder), recurrent severe, without psychosis ([place]) Diagnosis:   Patient Active Problem List   Diagnosis Date Noted  . Vasoocclusive sickle cell crisis ([place]) [D57.00] 08/21/2015  . Depression, major, recurrent ([place]) [F33.9]   . Family planning, Depo-Provera contraception monitoring/administration [Z30.42] 08/08/2015  . Sickle cell anemia with crisis ([place]) [D57.00] 07/12/2015  . Sickle cell anemia with pain ([place]) [D57.00] 05/28/2015  . Intractable pain [R52]   . Chest pain [R07.9] 03/07/2015  . Leukocytosis [D72.829] 03/07/2015  . Vitamin D deficiency [E55.9] 01/08/2015  . Chronic pain syndrome [G89.4] 01/08/2015  . Sickle cell crisis ([place]) [D57.00] 10/22/2014  . Hypokalemia [E87.6] 08/21/2014  . Depression [F32.9] 08/09/2014  . Hb-SS disease without crisis ([place]) [D57.1] 08/07/2014  . Sickle cell disease with crisis ([place]) [D57.00] 07/12/2014  . Hb-SS disease with crisis ([place]) [D57.00] 06/11/2014  . Pericardial effusion [I31.3]   . Anemia [D64.9] 02/09/2014  . Neuropathy ([place]) [G62.9] 02/09/2014  . Chronic headache disorder [R51] 02/09/2014  . Abnormal CT scan, chest [R93.8]   . Sickle cell pain crisis ([place]) [D57.00] 12/30/2013    Total Time spent with patient: 45 minutes  Subjective:   Brittany Foster is a 30 y.o. female patient admitted with depression and sickle cell crisis pain.  HPI:  Brittany Foster is a 30 y.o. female, Seen, chart reviewed and case discussed with the LCSW for the face-to-face psychiatric consultation and evaluation of increased symptoms of depression and required appropriate medication management. Patient stated that she has been depressed over over couple of months secondary to multiple stresses, single parent, normal  hell from the extended family members and has more frequent hospitalization for sickle cell crisis. Patient endorses sad mood, tearful, dysphoric, isolated, withdrawn, hopeless and helpless but denied active suicidal and homicidal ideation, intention or plans. Patient has a no evidence of manic episodes or psychotic episodes. Patient denies substance abuse. Patient contract for safety while in the hospital. Patient is hoping to add Abilify to help her medications Cymbalta which is taking for chronic pains from primary care physicians. Patient has no history of suicidal attempts or outpatient medication management.  Past Psychiatric History: Depression and has no acute psych inpatient admissions.  Risk to Self: Is patient at risk for suicide?: No Risk to Others:   Prior Inpatient Therapy:   Prior Outpatient Therapy:    Past Medical History:  Past Medical History:  Diagnosis Date  . Anemia   . Depression, major, recurrent ([place])   . Migraines   . Sickle cell anemia (HCC)     Past Surgical History:  Procedure Laterality Date  . CESAREAN SECTION    . CHOLECYSTECTOMY  2000  . IR GENERIC HISTORICAL  10/08/2015   IR US GUIDE VASC ACCESS RIGHT 10/08/2015 Sandi Mariscal, MD [facility]  . IR GENERIC HISTORICAL  10/08/2015   IR FLUORO GUIDE CV LINE RIGHT 10/08/2015 Sandi Mariscal, MD [facility]  . MULTIPLE TOOTH EXTRACTIONS N/A   . port a cath placement Right    about 6-7 years ago  . removal of porta cath Right 09/11/15  . TUBAL LIGATION     Family History:  Family History  Problem Relation Age of Onset  . Sickle cell trait Father   . Sickle cell trait Mother   . Sickle cell anemia Other  Family Psychiatric  History: patient denied family history of mental illness and does not get along with her mother who wants to take her son away from her to [place] which she disagree.   Social History:  History  Alcohol Use No     History  Drug Use No    Social History   Social History  . Marital  status: Single    Spouse name: N/A  . Number of children: N/A  . Years of education: N/A   Social History Main Topics  . Smoking status: Former Research scientist (life sciences)  . Smokeless tobacco: Never Used  . Alcohol use No  . Drug use: No  . Sexual activity: Yes    Partners: Male    Birth control/ protection: Injection     Comment: 1 new partner recently, no concern about STI, uses condoms   Other Topics Concern  . None   Social History Narrative  . None   Additional Social History:    Allergies:   Allergies  Allergen Reactions  . Ultram [Tramadol] Other (See Comments)    seizures  . Zofran [Ondansetron Hcl] Nausea And Vomiting  . Buprenorphine Hcl Hives and Rash    Shaking Tolerates Percocet, Norco, and buprenorphine  . Morphine And Related Hives, Rash and Other (See Comments)    Shaking Tolerates Percocet, Norco, Dilaudid, and buprenorphine  . Tape Rash    Labs:  Results for orders placed or performed during the hospital encounter of 01/05/16 (from the past 48 hour(s))  CBC with Differential/Platelet     Status: Abnormal   Collection Time: 01/08/16 11:10 AM  Result Value Ref Range   WBC 10.4 4.0 - 10.5 K/uL   RBC 2.24 (L) 3.87 - 5.11 MIL/uL   Hemoglobin 7.6 (L) 12.0 - 15.0 g/dL   HCT 21.8 (L) 36.0 - 46.0 %   MCV 97.3 78.0 - 100.0 fL   MCH 33.9 26.0 - 34.0 pg   MCHC 34.9 30.0 - 36.0 g/dL   RDW 21.4 (H) 11.5 - 15.5 %   Platelets 254 150 - 400 K/uL   Neutrophils Relative % 51 %   Lymphocytes Relative 36 %   Monocytes Relative 7 %   Eosinophils Relative 6 %   Basophils Relative 0 %   nRBC 2 (H) 0 /100 WBC   Neutro Abs 5.4 1.7 - 7.7 K/uL   Lymphs Abs 3.7 0.7 - 4.0 K/uL   Monocytes Absolute 0.7 0.1 - 1.0 K/uL   Eosinophils Absolute 0.6 0.0 - 0.7 K/uL   Basophils Absolute 0.0 0.0 - 0.1 K/uL   RBC Morphology POLYCHROMASIA PRESENT     Comment: TARGET CELLS SICKLE CELLS   Reticulocytes     Status: Abnormal   Collection Time: 01/08/16 11:10 AM  Result Value Ref Range   Retic  Ct Pct 18.3 (H) 0.4 - 3.1 %   RBC. 2.24 (L) 3.87 - 5.11 MIL/uL   Retic Count, Manual 409.9 (H) 19.0 - 186.0 K/uL  Basic metabolic panel     Status: Abnormal   Collection Time: 01/08/16 11:10 AM  Result Value Ref Range   Sodium 139 135 - 145 mmol/L   Potassium 3.5 3.5 - 5.1 mmol/L   Chloride 113 (H) 101 - 111 mmol/L   CO2 23 22 - 32 mmol/L   Glucose, Bld 103 (H) 65 - 99 mg/dL   BUN 5 (L) 6 - 20 mg/dL   Creatinine, Ser 0.46 0.44 - 1.00 mg/dL   Calcium 8.5 (L) 8.9 - 10.3 mg/dL  GFR calc non Af Amer >60 >60 mL/min   GFR calc Af Amer >60 >60 mL/min    Comment: (NOTE) The eGFR has been calculated using the CKD EPI equation. This calculation has not been validated in all clinical situations. eGFR's persistently <60 mL/min signify possible Chronic Kidney Disease.    Anion gap 3 (L) 5 - 15    Current Facility-Administered Medications  Medication Dose Route Frequency Provider Last Rate Last Dose  . dextrose 5 %-0.45 % sodium chloride infusion   Intravenous Continuous Leana Gamer, MD 10 mL/hr at 01/08/16 1314    . diphenhydrAMINE (BENADRYL) capsule 25-50 mg  25-50 mg Oral Q4H PRN Leana Gamer, MD   50 mg at 01/07/16 1415   Or  . diphenhydrAMINE (BENADRYL) 25 mg in sodium chloride 0.9 % 50 mL IVPB  25 mg Intravenous Q4H PRN Leana Gamer, MD      . DULoxetine (CYMBALTA) DR capsule 60 mg  60 mg Oral Daily Ivor Costa, MD   60 mg at 01/09/16 0955  . enoxaparin (LOVENOX) injection 40 mg  40 mg Subcutaneous Q24H Ivor Costa, MD      . folic acid (FOLVITE) tablet 1 mg  1 mg Oral Daily Ivor Costa, MD   1 mg at 01/09/16 0955  . HYDROmorphone (DILAUDID) 1 mg/mL PCA injection   Intravenous Q4H Leana Gamer, MD      . hydroxyurea (HYDREA) capsule 1,000 mg  1,000 mg Oral Daily Ivor Costa, MD   1,000 mg at 01/09/16 0955  . ketorolac (TORADOL) 30 MG/ML injection 30 mg  30 mg Intravenous Q6H Ivor Costa, MD   30 mg at 01/10/16 0559  . morphine (MS CONTIN) 12 hr tablet 30 mg  30 mg  Oral Q12H Ivor Costa, MD   30 mg at 01/09/16 2157  . naloxone [facility]) injection 0.4 mg  0.4 mg Intravenous PRN Leana Gamer, MD       And  . sodium chloride flush (NS) 0.9 % injection 9 mL  9 mL Intravenous PRN Leana Gamer, MD      . oxyCODONE-acetaminophen (PERCOCET/ROXICET) 5-325 MG per tablet 1 tablet  1 tablet Oral Q4H Leana Gamer, MD   1 tablet at 01/10/16 3154   And  . oxyCODONE (Oxy IR/ROXICODONE) immediate release tablet 5 mg  5 mg Oral Q4H Leana Gamer, MD   5 mg at 01/10/16 0859  . polyethylene glycol (MIRALAX / GLYCOLAX) packet 17 g  17 g Oral Daily PRN Ivor Costa, MD      . promethazine (PHENERGAN) tablet 12.5-25 mg  12.5-25 mg Oral Q4H PRN Ivor Costa, MD   25 mg at 01/07/16 1415   Or  . promethazine (PHENERGAN) suppository 12.5-25 mg  12.5-25 mg Rectal Q4H PRN Ivor Costa, MD      . senna-docusate (Senokot-S) tablet 1 tablet  1 tablet Oral BID Ivor Costa, MD   1 tablet at 01/09/16 0955  . sodium chloride flush (NS) 0.9 % injection 10-40 mL  10-40 mL Intracatheter PRN Leana Gamer, MD      . topiramate (TOPAMAX) tablet 50 mg  50 mg Oral BID Ivor Costa, MD   50 mg at 01/09/16 2157  . zolpidem (AMBIEN) tablet 5 mg  5 mg Oral QHS PRN Ivor Costa, MD        Musculoskeletal: Strength & Muscle Tone: decreased Gait & Station: unable to stand Patient leans: N/A  Psychiatric Specialty Exam: Physical Exam as per history and  physical  ROS generalized weakness, body pains and depression with loss of interest and isolation.  Blood pressure (!) 98/52, pulse 94, temperature 98.2 F (36.8 C), temperature source Oral, resp. rate 14, height 5' (1.524 m), weight 60.6 kg (133 lb 9.6 oz), SpO2 95 %.Body mass index is 26.09 kg/m.  General Appearance: Casual  Eye Contact:  Good  Speech:  Clear and Coherent and Slow  Volume:  Decreased  Mood:  Depressed and Dysphoric  Affect:  Depressed and Tearful  Thought Process:  Coherent and Goal Directed  Orientation:  Full  (Time, Place, and Person)  Thought Content:  Logical  Suicidal Thoughts:  No  Homicidal Thoughts:  No  Memory:  Immediate;   Good Recent;   Fair Remote;   Fair  Judgement:  Intact  Insight:  Good  Psychomotor Activity:  Decreased  Concentration:  Concentration: Good and Attention Span: Good  Recall:  Good  Fund of Knowledge:  Good  Language:  Good  Akathisia:  Negative  Handed:  Right  AIMS (if indicated):     Assets:  Communication Skills Desire for Improvement Financial Resources/Insurance Housing Leisure Time Resilience Transportation  ADL's:  Impaired  Cognition:  WNL  Sleep:        Treatment Plan Summary: 30 years old female with 40 years old son admitted with sickle cell crisis and has partial compliant with medication. She has been depressed and has multiple psychosocial stresses and inadequate social support and her current medication cymbalta given for chronic pain, not helping for depression. She is willing to give a trial of booster which is Abilify.  Patient seen along with LCSW who can provide out patient mental health referral when medically stable No safety concerns Start Abilify 5 mg PO BID as booster for depression  and continue Cymblata 60 mg daily Daily contact with patient to assess and evaluate symptoms and progress in treatment and Medication management  Appreciate psychiatric consultation and follow up as clinically required Please contact [phone] or [phone] if needs further assistance  Disposition: No evidence of imminent risk to self or others at present.   Patient does not meet criteria for psychiatric inpatient admission. Supportive therapy provided about ongoing stressors.  Ambrose Finland, MD 01/10/2016 9:29 AM

## 2016-01-11 ENCOUNTER — Other Ambulatory Visit (HOSPITAL_COMMUNITY): Payer: Self-pay | Admitting: Radiology

## 2016-01-11 ENCOUNTER — Inpatient Hospital Stay (HOSPITAL_COMMUNITY): Payer: Medicaid Other

## 2016-01-11 DIAGNOSIS — R269 Unspecified abnormalities of gait and mobility: Secondary | ICD-10-CM

## 2016-01-11 NOTE — Progress Notes (Signed)
Patient ID: Brittany Foster, female   DOB: 04/08/1985, 30 y.o.   MRN: [id number] Subjective:  Patient admitted for Sickle Cell Pain Crisis, she is opiate tolerant. She said her pain is better today but still at 7/10. Pain localized to hips, lower back and legs. MRI completed this morning, anxious to see the report. Working and walking with PT. No new complaint today. No fever, no chest pain, no SOB, no urinary symptom.  Objective:  Vital signs in last 24 hours:  Vitals:   01/11/16 0532 01/11/16 0838 01/11/16 0953 01/11/16 1015  BP: (!) 90/59   93/68  Pulse: 100   85  Resp: 12 14 10 16   Temp: 98.2 F (36.8 C)   98.8 F (37.1 C)  TempSrc: Oral   Oral  SpO2: 100% 96% 91% 95%  Weight:      Height:        Intake/Output from previous day:   Intake/Output Summary (Last 24 hours) at 01/11/16 1044 Last data filed at 01/11/16 0900  Gross per 24 hour  Intake           849.47 ml  Output                0 ml  Net           849.47 ml    Physical Exam: General: Alert, awake, oriented x3, in no acute distress.  HEENT: [place]/AT PEERL, EOMI Neck: Trachea midline,  no masses, no thyromegal,y no JVD, no carotid bruit OROPHARYNX:  Moist, No exudate/ erythema/lesions.  Heart: Regular rate and rhythm, without murmurs, rubs, gallops, PMI non-displaced, no heaves or thrills on palpation.  Lungs: Clear to auscultation, no wheezing or rhonchi noted. No increased vocal fremitus resonant to percussion  Abdomen: Soft, nontender, nondistended, positive bowel sounds, no masses no hepatosplenomegaly noted..  Neuro: No focal neurological deficits noted cranial nerves II through XII grossly intact. DTRs 2+ bilaterally upper and lower extremities. Strength 5 out of 5 in bilateral upper and lower extremities. Musculoskeletal: No warm swelling or erythema around joints, no spinal tenderness noted. Psychiatric: Patient alert and oriented x3, good insight and cognition, good recent to remote recall. Lymph node survey:  No cervical axillary or inguinal lymphadenopathy noted.  Lab Results:  Basic Metabolic Panel:    Component Value Date/Time   NA 139 01/08/2016 1110   K 3.5 01/08/2016 1110   CL 113 (H) 01/08/2016 1110   CO2 23 01/08/2016 1110   BUN 5 (L) 01/08/2016 1110   CREATININE 0.46 01/08/2016 1110   GLUCOSE 103 (H) 01/08/2016 1110   CALCIUM 8.5 (L) 01/08/2016 1110   CBC:    Component Value Date/Time   WBC 9.1 01/10/2016 1214   HGB 7.1 (L) 01/10/2016 1214   HCT 20.6 (L) 01/10/2016 1214   PLT 274 01/10/2016 1214   MCV 97.6 01/10/2016 1214   NEUTROABS 4.9 01/10/2016 1214   LYMPHSABS 3.1 01/10/2016 1214   MONOABS 0.7 01/10/2016 1214   EOSABS 0.4 01/10/2016 1214   BASOSABS 0.0 01/10/2016 1214    No results found for this or any previous visit (from the past 240 hour(s)).  Studies/Results: Mr Hip Right Wo Contrast  Result Date: 01/11/2016 CLINICAL DATA:  SICKLE CELL DISEASE.  BILATERAL HIP PAIN.  LEG PAIN. EXAM: MR OF THE RIGHT HIP WITHOUT CONTRAST MR OF THE LEFT HIP WITHOUT CONTRAST TECHNIQUE: Multiplanar, multisequence MR imaging was performed. No intravenous contrast was administered. COMPARISON:  None. FINDINGS: Bones: Diffuse T1 and T2 hypo intense marrow throughout the  lower lumbar spine, pelvis and femurs. No hip fracture, dislocation or avascular necrosis. No evidence of a bone infarct. Normal sacrum and sacroiliac joints. No SI joint widening or erosive changes. Articular cartilage and labrum Articular cartilage:  No chondral defect. Labrum: Grossly intact, but evaluation is limited by lack of intraarticular fluid. Joint or bursal effusion Joint effusion:  No hip joint effusion.  No SI joint effusion. Bursae:  No bursa formation. Muscles and tendons Flexors: Normal. Extensors: Normal. Abductors: Normal. Adductors: Normal. Rotators: Normal. Hamstrings: Normal. Other findings Miscellaneous: Trace pelvic free fluid. No inguinal lymphadenopathy. No inguinal hernia. No adnexal mass.  IMPRESSION: 1. No hip fracture, dislocation or avascular necrosis. 2. Diffuse low signal marrow throughout the visualized osseous structures consistent with patient's history of sickle cell disease. Electronically Signed   By: Elige Ko   On: 01/11/2016 10:14   Mr Hip Left Wo Contrast  Result Date: 01/11/2016 CLINICAL DATA:  SICKLE CELL DISEASE.  BILATERAL HIP PAIN.  LEG PAIN. EXAM: MR OF THE RIGHT HIP WITHOUT CONTRAST MR OF THE LEFT HIP WITHOUT CONTRAST TECHNIQUE: Multiplanar, multisequence MR imaging was performed. No intravenous contrast was administered. COMPARISON:  None. FINDINGS: Bones: Diffuse T1 and T2 hypo intense marrow throughout the lower lumbar spine, pelvis and femurs. No hip fracture, dislocation or avascular necrosis. No evidence of a bone infarct. Normal sacrum and sacroiliac joints. No SI joint widening or erosive changes. Articular cartilage and labrum Articular cartilage:  No chondral defect. Labrum: Grossly intact, but evaluation is limited by lack of intraarticular fluid. Joint or bursal effusion Joint effusion:  No hip joint effusion.  No SI joint effusion. Bursae:  No bursa formation. Muscles and tendons Flexors: Normal. Extensors: Normal. Abductors: Normal. Adductors: Normal. Rotators: Normal. Hamstrings: Normal. Other findings Miscellaneous: Trace pelvic free fluid. No inguinal lymphadenopathy. No inguinal hernia. No adnexal mass. IMPRESSION: 1. No hip fracture, dislocation or avascular necrosis. 2. Diffuse low signal marrow throughout the visualized osseous structures consistent with patient's history of sickle cell disease. Electronically Signed   By: Elige Ko   On: 01/11/2016 10:14    Medications: Scheduled Meds: . ARIPiprazole  5 mg Oral BID  . DULoxetine  60 mg Oral Daily  . enoxaparin (LOVENOX) injection  40 mg Subcutaneous Q24H  . folic acid  1 mg Oral Daily  . HYDROmorphone   Intravenous Q4H  . hydroxyurea  1,000 mg Oral Daily  . morphine  30 mg Oral Q12H  .  oxyCODONE-acetaminophen  1 tablet Oral Q4H   And  . oxyCODONE  5 mg Oral Q4H  . senna-docusate  1 tablet Oral BID  . topiramate  50 mg Oral BID   Continuous Infusions: . dextrose 5 % and 0.45% NaCl 10 mL/hr at 01/08/16 1314   PRN Meds:.diphenhydrAMINE **OR** diphenhydrAMINE (BENADRYL) IVPB(SICKLE CELL ONLY), naloxone **AND** sodium chloride flush, polyethylene glycol, promethazine **OR** promethazine, sodium chloride flush, zolpidem  Consultants:  None  Procedures:  None  Antibiotics:  None  Assessment/Plan: Principal Problem:   MDD (major depressive disorder), recurrent severe, without psychosis (HCC) Active Problems:   Sickle cell pain crisis (HCC)   Hb-SS disease with crisis (HCC)   Depression   Sickle cell crisis (HCC)   Leukocytosis   Sickle cell anemia with crisis (HCC)   1. Hb SS with crisis: Pain improving, Continue PCA,Toradol and scheduled clinician assisted doses. 2. B/L Hip Pain: Pt at risk for AVN. MRI of B/L hips showed no fracture, dislocation or AVN. There is diffuse low signal marrow throughout  the visualized osseous structures consistent with sickle cell disease. Patient informed of report.  3. Leukocytosis: No evidence of infection.Likely related to crisis. 4. Anemia of chronic disease: Hb stable. 5. Chronic pain: Continue MS Contin 6. Major Depression: Pt seen by Psychiatry and Abilify added to Cymbalta. Patient appears motivated to be adherent with medications 7. Gait Abnormality: Due to hip pain. Ambulating with walker. MRI hips are negative for AVN. 8. Medication non-compliance: Pt states that she takes her MS Contin as prescribed on a daily basis and went for more than 1 week without needing Oxycodone. She admits however that she probably takes Hydrea about 1 x week. Counseled about medication adherence  Code Status: Full Code Family Communication: N/A Disposition Plan: For possible discharge tomorrow  Brittany Foster  If 7PM-7AM, please  contact night-coverage.  01/11/2016, 10:44 AM  LOS: 5 days

## 2016-01-12 MED ORDER — ARIPIPRAZOLE 5 MG PO TABS: 5.0000 mg | ORAL_TABLET | Freq: Two times a day (BID) | ORAL | 3 refills | Status: DC

## 2016-01-12 MED ORDER — HEPARIN SOD (PORK) LOCK FLUSH 100 UNIT/ML IV SOLN
500.0000 [IU] | Freq: Once | INTRAVENOUS | Status: AC
  Administered 2016-01-12: 500 [IU] via INTRAVENOUS
  Filled 2016-01-12: qty 5

## 2016-01-12 NOTE — Progress Notes (Signed)
Patient discharged to home with family, discharge instructions reviewed with patient who verbalized understanding. Dressing changed on patients central line & flushed with NS & Heparin.

## 2016-01-12 NOTE — Discharge Summary (Signed)
Physician Discharge Summary  Brittany Foster ZHY:[id number] DOB: 11/22/1985 DOA: 01/05/2016  PCP: Jeanann LewandowskyJEGEDE, Peggye Poon, MD  Admit date: 01/05/2016  Discharge date: 01/12/2016  Discharge Diagnoses:  Principal Problem:   MDD (major depressive disorder), recurrent severe, without psychosis (HCC) Active Problems:   Sickle cell pain crisis (HCC)   Hb-SS disease with crisis (HCC)   Depression   Sickle cell crisis (HCC)   Leukocytosis   Sickle cell anemia with crisis (HCC)   Gait abnormality  Discharge Condition: Stable Disposition:  Follow-up Information    Benetta Maclaren, MD. Go in 2 day(s).   Specialty:  Internal Medicine Contact information: [address] [place] [address] [phone]          Diet: Regular  Wt Readings from Last 3 Encounters:  01/12/16 58.4 kg (128 lb 12 oz)  12/31/15 56.7 kg (125 lb)  12/26/15 56.7 kg (125 lb)   History of present illness:  Brittany Foster is a 30 y.o. female with medical history significant of sickle cell anemia, migraine headaches, depression, who presents with pain all over. Pt states that she woke up this am and her legs, back and chest were hurting. The pain is constant, 10 out of 10 in severity, nonradiating. It is not aggravated or alleviated by any known factors. Patient states that her pain is typical for her past sickle cell pain flare up. She has mild shortness of breath, but no tenderness over calf areas. Pt states that she took her home pain medications without help since this AM. She has nausea, vomited once. No diarrhea or abdominal pain. No cough, fever, chills, symptoms for UTI or unilateral weakness.  ED Course: pt was found to have stable hemoglobin, WBC 14.9, electrolytes and renal function okay, temperature 99.2, no tachycardia, oxygen saturation 95% on room air. Patient is placed on MedSurg bed for observation.  Hospital Course:  Patient was admitted for vasculo-occlusive crisis. She was managed with the  standard sickle cell pain management protocol including generous IVF, IV Toradol, weight based dilaudid via PCA and clinician assisted dilaudid doses. She complained of bilateral hip pains, MRI was ordered to rule out AVN, was negative. Patient slowly improved, Hb remained stable, no evidence of infection, she remained hemodynamically stable. She was seen by psychiatrist in the hospital for clinical depression, Abilify was added to her home medications, to continue Cymbalta. She was encouraged to be adherent with her Hydroxyurea and folic acid. Today she is able to ambulate with minimal pain, no fever, no chest pain, no SOB, vitals remain stable. She was discharged home today in hemodynamically stable condition to follow up with me in the clinic in 2 days, patient has appointment.  Discharge Exam: Vitals:   01/12/16 0947 01/12/16 1000  BP: (!) 98/57   Pulse: 95   Resp: 16 18  Temp: 98.1 F (36.7 C)    Vitals:   01/12/16 0400 01/12/16 0558 01/12/16 0947 01/12/16 1000  BP:  95/62 (!) 98/57   Pulse:  96 95   Resp: 15 11 16 18   Temp:  98.9 F (37.2 C) 98.1 F (36.7 C)   TempSrc:  Oral Oral   SpO2: 95% 95% 98% 100%  Weight:      Height:        General appearance : Awake, alert, not in any distress. Speech Clear. Not toxic looking HEENT: Atraumatic and Normocephalic, pupils equally reactive to light and accomodation Neck: Supple, no JVD. No cervical lymphadenopathy.  Chest: Good air entry bilaterally, no added  sounds  CVS: S1 S2 regular, no murmurs.  Abdomen: Bowel sounds present, Non tender and not distended with no gaurding, rigidity or rebound. Extremities: B/L Lower Ext shows no edema, both legs are warm to touch Neurology: Awake alert, and oriented X 3, CN II-XII intact, Non focal Skin: No Rash  Discharge Instructions    Medication List    TAKE these medications   ARIPiprazole 5 MG tablet Commonly known as:  ABILIFY Take 1 tablet (5 mg total) by mouth 2 (two) times daily.    DULoxetine 60 MG capsule Commonly known as:  CYMBALTA TAKE 1 CAPSULE BY MOUTH DAILY.   folic acid 1 MG tablet Commonly known as:  FOLVITE Take 1 tablet (1 mg total) by mouth daily.   gabapentin 300 MG capsule Commonly known as:  NEURONTIN TAKE 1 CAPSULE BY MOUTH 3 TIMES DAILY.   hydroxyurea 500 MG capsule Commonly known as:  HYDREA TAKE 2 CAPSULES BY MOUTH DAILY. MAY TAKE WITH FOOD TO MINIMIZE GI SIDE EFFECTS.   morphine 30 MG 12 hr tablet Commonly known as:  MS CONTIN Take 1 tablet (30 mg total) by mouth every 12 (twelve) hours.   oxyCODONE-acetaminophen 10-325 MG tablet Commonly known as:  PERCOCET Take 1 tablet by mouth every 4 (four) hours as needed for pain.   potassium chloride SA 20 MEQ tablet Commonly known as:  K-DUR,KLOR-CON Take 2 tablets (40 mEq total) by mouth daily.   Topiramate ER 100 MG Cp24 Take 100 mg by mouth at bedtime.        The results of significant diagnostics from this hospitalization (including imaging, microbiology, ancillary and laboratory) are listed below for reference.    Significant Diagnostic Studies: Dg Chest 2 View  Result Date: 01/05/2016 CLINICAL DATA:  Sickle-cell crisis EXAM: CHEST  2 VIEW COMPARISON:  11/21/2015 FINDINGS: The heart size and mediastinal contours are within normal limits. Right central venous catheter tip is seen at the cavoatrial junction as before. Both lungs are clear. The visualized skeletal structures are unremarkable. Cholecystectomy clips in the right upper quadrant. IMPRESSION: No active cardiopulmonary disease. Electronically Signed   By: Tollie Ethavid  Kwon M.D.   On: 01/05/2016 23:57   Mr Hip Right Wo Contrast  Result Date: 01/11/2016 CLINICAL DATA:  SICKLE CELL DISEASE.  BILATERAL HIP PAIN.  LEG PAIN. EXAM: MR OF THE RIGHT HIP WITHOUT CONTRAST MR OF THE LEFT HIP WITHOUT CONTRAST TECHNIQUE: Multiplanar, multisequence MR imaging was performed. No intravenous contrast was administered. COMPARISON:  None. FINDINGS:  Bones: Diffuse T1 and T2 hypo intense marrow throughout the lower lumbar spine, pelvis and femurs. No hip fracture, dislocation or avascular necrosis. No evidence of a bone infarct. Normal sacrum and sacroiliac joints. No SI joint widening or erosive changes. Articular cartilage and labrum Articular cartilage:  No chondral defect. Labrum: Grossly intact, but evaluation is limited by lack of intraarticular fluid. Joint or bursal effusion Joint effusion:  No hip joint effusion.  No SI joint effusion. Bursae:  No bursa formation. Muscles and tendons Flexors: Normal. Extensors: Normal. Abductors: Normal. Adductors: Normal. Rotators: Normal. Hamstrings: Normal. Other findings Miscellaneous: Trace pelvic free fluid. No inguinal lymphadenopathy. No inguinal hernia. No adnexal mass. IMPRESSION: 1. No hip fracture, dislocation or avascular necrosis. 2. Diffuse low signal marrow throughout the visualized osseous structures consistent with patient's history of sickle cell disease. Electronically Signed   By: Elige KoHetal  Patel   On: 01/11/2016 10:14   Mr Hip Left Wo Contrast  Result Date: 01/11/2016 CLINICAL DATA:  SICKLE CELL DISEASE.  BILATERAL HIP PAIN.  LEG PAIN. EXAM: MR OF THE RIGHT HIP WITHOUT CONTRAST MR OF THE LEFT HIP WITHOUT CONTRAST TECHNIQUE: Multiplanar, multisequence MR imaging was performed. No intravenous contrast was administered. COMPARISON:  None. FINDINGS: Bones: Diffuse T1 and T2 hypo intense marrow throughout the lower lumbar spine, pelvis and femurs. No hip fracture, dislocation or avascular necrosis. No evidence of a bone infarct. Normal sacrum and sacroiliac joints. No SI joint widening or erosive changes. Articular cartilage and labrum Articular cartilage:  No chondral defect. Labrum: Grossly intact, but evaluation is limited by lack of intraarticular fluid. Joint or bursal effusion Joint effusion:  No hip joint effusion.  No SI joint effusion. Bursae:  No bursa formation. Muscles and tendons Flexors:  Normal. Extensors: Normal. Abductors: Normal. Adductors: Normal. Rotators: Normal. Hamstrings: Normal. Other findings Miscellaneous: Trace pelvic free fluid. No inguinal lymphadenopathy. No inguinal hernia. No adnexal mass. IMPRESSION: 1. No hip fracture, dislocation or avascular necrosis. 2. Diffuse low signal marrow throughout the visualized osseous structures consistent with patient's history of sickle cell disease. Electronically Signed   By: Elige Ko   On: 01/11/2016 10:14    Microbiology: No results found for this or any previous visit (from the past 240 hour(s)).   Labs: Basic Metabolic Panel:  Recent Labs Lab 01/05/16 2023 01/08/16 1110  NA 138 139  K 3.5 3.5  CL 109 113*  CO2 23 23  GLUCOSE 114* 103*  BUN 11 5*  CREATININE 0.47 0.46  CALCIUM 9.1 8.5*   Liver Function Tests:  Recent Labs Lab 01/05/16 2023  AST 47*  ALT 31  ALKPHOS 74  BILITOT 2.6*  PROT 7.7  ALBUMIN 4.2   No results for input(s): LIPASE, AMYLASE in the last 168 hours. No results for input(s): AMMONIA in the last 168 hours. CBC:  Recent Labs Lab 01/05/16 2023 01/08/16 1110 01/10/16 1214  WBC 14.9* 10.4 9.1  NEUTROABS 8.2* 5.4 4.9  HGB 8.1* 7.6* 7.1*  HCT 23.4* 21.8* 20.6*  MCV 96.7 97.3 97.6  PLT 258 254 274   Cardiac Enzymes: No results for input(s): CKTOTAL, CKMB, CKMBINDEX, TROPONINI in the last 168 hours. BNP: Invalid input(s): POCBNP CBG: No results for input(s): GLUCAP in the last 168 hours.  Time coordinating discharge: 50 minutes  Signed:  Jeanann Lewandowsky  [facility] 01/12/2016, 12:31 PM

## 2016-01-14 ENCOUNTER — Telehealth: Payer: Self-pay

## 2016-01-15 ENCOUNTER — Other Ambulatory Visit: Payer: Self-pay | Admitting: Internal Medicine

## 2016-01-15 ENCOUNTER — Encounter (HOSPITAL_COMMUNITY): Payer: Self-pay | Admitting: Hematology

## 2016-01-15 ENCOUNTER — Telehealth (HOSPITAL_COMMUNITY): Payer: Self-pay | Admitting: *Deleted

## 2016-01-15 ENCOUNTER — Non-Acute Institutional Stay (HOSPITAL_COMMUNITY)
Admission: AD | Admit: 2016-01-15 | Discharge: 2016-01-15 | Disposition: A | Discharge status: Home / Self Care | Payer: Medicaid Other | Source: Ambulatory Visit | Attending: Internal Medicine | Admitting: Internal Medicine

## 2016-01-15 DIAGNOSIS — D5700 Hb-SS disease with crisis, unspecified: Secondary | ICD-10-CM | POA: Diagnosis not present

## 2016-01-15 LAB — URINALYSIS, ROUTINE W REFLEX MICROSCOPIC
BILIRUBIN URINE: NEGATIVE
Glucose, UA: NEGATIVE mg/dL
HGB URINE DIPSTICK: NEGATIVE
Ketones, ur: NEGATIVE mg/dL
Nitrite: NEGATIVE
PH: 6 (ref 5.0–8.0)
Protein, ur: NEGATIVE mg/dL
SPECIFIC GRAVITY, URINE: 1.016 (ref 1.005–1.030)

## 2016-01-15 LAB — CBC WITH DIFFERENTIAL/PLATELET
BASOS PCT: 1 %
Basophils Absolute: 0.1 10*3/uL (ref 0.0–0.1)
EOS PCT: 0 %
Eosinophils Absolute: 0 10*3/uL (ref 0.0–0.7)
HCT: 24.1 % — ABNORMAL LOW (ref 36.0–46.0)
Hemoglobin: 8.7 g/dL — ABNORMAL LOW (ref 12.0–15.0)
LYMPHS ABS: 2 10*3/uL (ref 0.7–4.0)
Lymphocytes Relative: 24 %
MCH: 36.1 pg — AB (ref 26.0–34.0)
MCHC: 36.1 g/dL — AB (ref 30.0–36.0)
MCV: 100 fL (ref 78.0–100.0)
MONO ABS: 0.8 10*3/uL (ref 0.1–1.0)
Monocytes Relative: 9 %
Neutro Abs: 5.5 10*3/uL (ref 1.7–7.7)
Neutrophils Relative %: 66 %
PLATELETS: 490 10*3/uL — AB (ref 150–400)
RBC: 2.41 MIL/uL — ABNORMAL LOW (ref 3.87–5.11)
RDW: 22.7 % — ABNORMAL HIGH (ref 11.5–15.5)
WBC: 8.4 10*3/uL (ref 4.0–10.5)

## 2016-01-15 LAB — URINE MICROSCOPIC-ADD ON

## 2016-01-15 LAB — RETICULOCYTES
RBC.: 2.41 MIL/uL — ABNORMAL LOW (ref 3.87–5.11)
RETIC CT PCT: 15 % — AB (ref 0.4–3.1)
Retic Count, Absolute: 361.5 10*3/uL — ABNORMAL HIGH (ref 19.0–186.0)

## 2016-01-15 MED ORDER — DIPHENHYDRAMINE HCL 25 MG PO CAPS
25.0000 mg | ORAL_CAPSULE | Freq: Four times a day (QID) | ORAL | Status: DC | PRN
  Administered 2016-01-15: 50 mg via ORAL
  Filled 2016-01-15: qty 2

## 2016-01-15 MED ORDER — OXYCODONE-ACETAMINOPHEN 10-325 MG PO TABS: 1.0000 | ORAL_TABLET | ORAL | 0 refills | Status: DC | PRN

## 2016-01-15 MED ORDER — SODIUM CHLORIDE 0.9% FLUSH
10.0000 mL | INTRAVENOUS | Status: AC | PRN
  Administered 2016-01-15: 10 mL

## 2016-01-15 MED ORDER — SENNOSIDES-DOCUSATE SODIUM 8.6-50 MG PO TABS: 1.0000 | ORAL_TABLET | Freq: Two times a day (BID) | ORAL | Status: DC

## 2016-01-15 MED ORDER — DEXTROSE-NACL 5-0.45 % IV SOLN
INTRAVENOUS | Status: DC
  Administered 2016-01-15: 10:00:00 via INTRAVENOUS

## 2016-01-15 MED ORDER — POLYETHYLENE GLYCOL 3350 17 G PO PACK: 17.0000 g | PACK | Freq: Every day | ORAL | Status: DC | PRN

## 2016-01-15 MED ORDER — KETOROLAC TROMETHAMINE 30 MG/ML IJ SOLN
30.0000 mg | Freq: Four times a day (QID) | INTRAMUSCULAR | Status: DC
  Administered 2016-01-15: 30 mg via INTRAVENOUS
  Filled 2016-01-15: qty 1

## 2016-01-15 MED ORDER — HYDROMORPHONE HCL 2 MG/ML IJ SOLN
2.0000 mg | INTRAMUSCULAR | Status: DC | PRN
  Administered 2016-01-15 (×4): 2 mg via INTRAVENOUS
  Filled 2016-01-15 (×4): qty 1

## 2016-01-15 MED ORDER — PROMETHAZINE HCL 25 MG PO TABS
25.0000 mg | ORAL_TABLET | Freq: Four times a day (QID) | ORAL | Status: DC | PRN
  Administered 2016-01-15: 25 mg via ORAL
  Filled 2016-01-15: qty 1

## 2016-01-15 MED ORDER — HEPARIN SOD (PORK) LOCK FLUSH 100 UNIT/ML IV SOLN
250.0000 [IU] | INTRAVENOUS | Status: AC | PRN
  Administered 2016-01-15: 250 [IU]

## 2016-01-15 NOTE — Progress Notes (Signed)
[facility]. Pt states her pain was 8/10 on admission and down to7/10 at discharge. Pt states her pain is in her hips, legs and back. She was treated with IVP Dilaudid, Toradol and IV fluids. Pt was alert, oriented, and ambulatory at discharge. She also received a prescription for Percocet. Discharge instructions given to pt with verbal understanding.

## 2016-01-15 NOTE — Telephone Encounter (Signed)
After speaking with the provider, pt was notified that she could come in for treatment. Pt voiced understanding and that she could get here by 9am.

## 2016-01-15 NOTE — H&P (Signed)
[facility] History and Physical  Brittany CampbellMiranda Foster ZOX:[id number] DOB: 08/02/1985 DOA: 01/15/2016  PCP: Jeanann LewandowskyJEGEDE, Yee Joss, MD   Chief Complaint: Pain  HPI: Brittany Foster is a 30 y.o. female with history of sickle cell disease recently discharged from the hospital for sickle cell pain crisis, uneventful hospital stay, presented to the day hospital today with complaint of pain in her back, hip joints and legs consistent with her sickle cell pain crisis. She had called yesterday but she did not have a ride to the day hospital, then she made a ride arrangement for this morning. She rated her pain at 8 out of 10 today, she last took her MS contin, Ibuprofen, and Oxy contin at 6 am this morning with no sustained relief. She denies any fever, no urinary symptoms, no headache, no shortness of breath, no chest pain, no nausea, vomiting or diarrhea. No joint swelling or redness.  Systemic Review: General: The patient denies anorexia, fever, weight loss Cardiac: Denies chest pain, syncope, palpitations, pedal edema  Respiratory: Denies cough, shortness of breath, wheezing GI: Denies severe indigestion/heartburn, abdominal pain, nausea, vomiting, diarrhea and constipation GU: Denies hematuria, incontinence, dysuria  Musculoskeletal: Denies arthritis  Skin: Denies suspicious skin lesions Neurologic: Denies focal weakness or numbness, change in vision  Past Medical History:  Diagnosis Date  . Anemia   . Depression, major, recurrent (HCC)   . Migraines   . Sickle cell anemia (HCC)     Past Surgical History:  Procedure Laterality Date  . CESAREAN SECTION    . CHOLECYSTECTOMY  2000  . IR GENERIC HISTORICAL  10/08/2015   IR US GUIDE VASC ACCESS RIGHT 10/08/2015 Simonne ComeJohn Watts, MD [facility]  . IR GENERIC HISTORICAL  10/08/2015   IR FLUORO GUIDE CV LINE RIGHT 10/08/2015 Simonne ComeJohn Watts, MD [facility]  . MULTIPLE TOOTH EXTRACTIONS N/A   . port a cath placement Right    about 6-7 years ago  .  removal of porta cath Right 09/11/15  . TUBAL LIGATION     Allergies  Allergen Reactions  . Ultram [Tramadol] Other (See Comments)    seizures  . Zofran [Ondansetron Hcl] Nausea And Vomiting  . Buprenorphine Hcl Hives and Rash    Shaking Tolerates Percocet, Norco, and buprenorphine  . Morphine And Related Hives, Rash and Other (See Comments)    Shaking Tolerates Percocet, Norco, Dilaudid, and buprenorphine  . Tape Rash    Family History  Problem Relation Age of Onset  . Sickle cell trait Father   . Sickle cell trait Mother   . Sickle cell anemia Other     Prior to Admission medications   Medication Sig Start Date End Date Taking? Authorizing Provider  ARIPiprazole (ABILIFY) 5 MG tablet Take 1 tablet (5 mg total) by mouth 2 (two) times daily. 01/12/16   Quentin Angstlugbemiga E Brenlee Koskela, MD  DULoxetine (CYMBALTA) 60 MG capsule TAKE 1 CAPSULE BY MOUTH DAILY. 11/27/15   Quentin Angstlugbemiga E Jaidan Stachnik, MD  folic acid (FOLVITE) 1 MG tablet Take 1 tablet (1 mg total) by mouth daily. 08/08/15   Massie MaroonLachina M [name], FNP  gabapentin (NEURONTIN) 300 MG capsule TAKE 1 CAPSULE BY MOUTH 3 TIMES DAILY. Patient not taking: Reported on 01/05/2016 11/27/15   Quentin Angstlugbemiga E Jenny Omdahl, MD  hydroxyurea (HYDREA) 500 MG capsule TAKE 2 CAPSULES BY MOUTH DAILY. MAY TAKE WITH FOOD TO MINIMIZE GI SIDE EFFECTS. 08/08/15   Massie MaroonLachina M [name], FNP  morphine (MS CONTIN) 30 MG 12 hr tablet Take 1 tablet (30 mg total) by  mouth every 12 (twelve) hours. 12/31/15   Quentin Angstlugbemiga E Juliah Scadden, MD  oxyCODONE-acetaminophen (PERCOCET) 10-325 MG tablet Take 1 tablet by mouth every 4 (four) hours as needed for pain. 12/31/15 01/15/16  Quentin Angstlugbemiga E Albaraa Swingle, MD  potassium chloride SA (K-DUR,KLOR-CON) 20 MEQ tablet Take 2 tablets (40 mEq total) by mouth daily. Patient not taking: Reported on 01/05/2016 11/23/15   Altha HarmMichelle A [name], MD  Topiramate ER 100 MG CP24 Take 100 mg by mouth at bedtime.    Historical Provider, MD   Physical Exam: There were no vitals filed for  this visit.  General: Alert, awake, afebrile, anicteric, not in obvious distress HEENT: Normocephalic and Atraumatic, Mucous membranes pink                PERRLA; EOM intact; No scleral icterus,                 Nares: Patent, Oropharynx: Clear, Fair Dentition                 Neck: FROM, no cervical lymphadenopathy, thyromegaly, carotid bruit or JVD;  CHEST WALL: No tenderness  CHEST: Normal respiration, clear to auscultation bilaterally  HEART: Regular rate and rhythm; no murmurs rubs or gallops  BACK: No kyphosis or scoliosis; no CVA tenderness  ABDOMEN: Positive Bowel Sounds, soft, non-tender; no masses, no organomegaly EXTREMITIES: No cyanosis, clubbing, or edema SKIN:  no rash or ulceration  CNS: Alert and Oriented x 4, Nonfocal exam, CN 2-12 intact  Labs on Admission:  Basic Metabolic Panel:  Recent Labs Lab 01/08/16 1110  NA 139  K 3.5  CL 113*  CO2 23  GLUCOSE 103*  BUN 5*  CREATININE 0.46  CALCIUM 8.5*   Liver Function Tests: No results for input(s): AST, ALT, ALKPHOS, BILITOT, PROT, ALBUMIN in the last 168 hours. No results for input(s): LIPASE, AMYLASE in the last 168 hours. No results for input(s): AMMONIA in the last 168 hours. CBC:  Recent Labs Lab 01/08/16 1110 01/10/16 1214  WBC 10.4 9.1  NEUTROABS 5.4 4.9  HGB 7.6* 7.1*  HCT 21.8* 20.6*  MCV 97.3 97.6  PLT 254 274   Cardiac Enzymes: No results for input(s): CKTOTAL, CKMB, CKMBINDEX, TROPONINI in the last 168 hours.  BNP (last 3 results) No results for input(s): BNP in the last 8760 hours.  ProBNP (last 3 results) No results for input(s): PROBNP in the last 8760 hours.  CBG: No results for input(s): GLUCAP in the last 168 hours.   Assessment/Plan Active Problems:   Sickle cell anemia with crisis (HCC)   Admits to the Day Hospital  IVF D5 .45% Saline @ 125 mls/hour  Individualized care plan with IV Dilaudid 2 mg Q2H started within 30 minutes of admission  IV Toradol 30 mg Q 6  H  Monitor vitals very closely, Re-evaluate pain scale every hour  2 L of Oxygen by [place]  Patient will be re-evaluated for pain in the context of function and relationship to baseline as care progresses.  If no significant relieve from pain (remains above 5/10) will transfer patient to inpatient services for further evaluation and management  Code Status: Full  Family Communication: None  DVT Prophylaxis: Ambulate as tolerated   Time spent: 35 Minutes  Bensyn Bornemann, MD, MHA, FACP, FAAP, CPE  If 7PM-7AM, please contact night-coverage [email or web address] 01/15/2016, 9:49 AM

## 2016-01-15 NOTE — Telephone Encounter (Signed)
[facility]. States her pain is in her legs, hips and back and rates her pain 8/10. She last took her MS contin, Ibuprofen, and Oxy contin at 6am this morning. She denies fever, chest pain, diarrhea or abdominal pain. She did state that she vomited last night and feels nauseous this morning. Will check with the provider and give her a call back. Pt voiced understanding.

## 2016-01-17 ENCOUNTER — Telehealth (HOSPITAL_COMMUNITY): Payer: Self-pay | Admitting: Hematology

## 2016-01-17 ENCOUNTER — Non-Acute Institutional Stay (HOSPITAL_COMMUNITY)
Admission: AD | Admit: 2016-01-17 | Discharge: 2016-01-17 | Disposition: A | Discharge status: Home / Self Care | Payer: Medicaid Other | Source: Ambulatory Visit | Attending: Internal Medicine | Admitting: Internal Medicine

## 2016-01-17 ENCOUNTER — Encounter (HOSPITAL_COMMUNITY): Payer: Self-pay | Admitting: *Deleted

## 2016-01-17 ENCOUNTER — Other Ambulatory Visit: Payer: Self-pay | Admitting: Internal Medicine

## 2016-01-17 DIAGNOSIS — D5700 Hb-SS disease with crisis, unspecified: Secondary | ICD-10-CM | POA: Insufficient documentation

## 2016-01-17 DIAGNOSIS — G894 Chronic pain syndrome: Secondary | ICD-10-CM | POA: Diagnosis not present

## 2016-01-17 DIAGNOSIS — Z832 Family history of diseases of the blood and blood-forming organs and certain disorders involving the immune mechanism: Secondary | ICD-10-CM | POA: Insufficient documentation

## 2016-01-17 DIAGNOSIS — Z79899 Other long term (current) drug therapy: Secondary | ICD-10-CM | POA: Insufficient documentation

## 2016-01-17 DIAGNOSIS — M25552 Pain in left hip: Secondary | ICD-10-CM | POA: Diagnosis not present

## 2016-01-17 DIAGNOSIS — M25551 Pain in right hip: Secondary | ICD-10-CM | POA: Insufficient documentation

## 2016-01-17 DIAGNOSIS — Z79891 Long term (current) use of opiate analgesic: Secondary | ICD-10-CM | POA: Insufficient documentation

## 2016-01-17 DIAGNOSIS — D571 Sickle-cell disease without crisis: Secondary | ICD-10-CM

## 2016-01-17 MED ORDER — SENNOSIDES-DOCUSATE SODIUM 8.6-50 MG PO TABS: 1.0000 | ORAL_TABLET | Freq: Two times a day (BID) | ORAL | Status: DC

## 2016-01-17 MED ORDER — NALOXONE HCL 0.4 MG/ML IJ SOLN
0.4000 mg | INTRAMUSCULAR | Status: DC | PRN
Start: 2016-01-17 — End: 2016-01-17

## 2016-01-17 MED ORDER — SODIUM CHLORIDE 0.9% FLUSH
10.0000 mL | INTRAVENOUS | Status: AC | PRN
  Administered 2016-01-17: 10 mL

## 2016-01-17 MED ORDER — SODIUM CHLORIDE 0.9 % IV SOLN
25.0000 mg | INTRAVENOUS | Status: DC | PRN
  Filled 2016-01-17: qty 0.5

## 2016-01-17 MED ORDER — POLYETHYLENE GLYCOL 3350 17 G PO PACK
17.0000 g | PACK | Freq: Every day | ORAL | Status: DC | PRN
Start: 2016-01-17 — End: 2016-01-17

## 2016-01-17 MED ORDER — ONDANSETRON HCL 4 MG/2ML IJ SOLN: 4.0000 mg | Freq: Four times a day (QID) | INTRAMUSCULAR | Status: DC | PRN

## 2016-01-17 MED ORDER — HEPARIN SOD (PORK) LOCK FLUSH 100 UNIT/ML IV SOLN
250.0000 [IU] | INTRAVENOUS | Status: AC | PRN
  Administered 2016-01-17: 250 [IU]
  Filled 2016-01-17: qty 5

## 2016-01-17 MED ORDER — DIPHENHYDRAMINE HCL 25 MG PO CAPS
25.0000 mg | ORAL_CAPSULE | ORAL | Status: DC | PRN
  Administered 2016-01-17: 50 mg via ORAL
  Filled 2016-01-17: qty 2

## 2016-01-17 MED ORDER — DEXTROSE-NACL 5-0.45 % IV SOLN
INTRAVENOUS | Status: DC
  Administered 2016-01-17: 11:00:00 via INTRAVENOUS

## 2016-01-17 MED ORDER — SODIUM CHLORIDE 0.9% FLUSH: 9.0000 mL | INTRAVENOUS | Status: DC | PRN

## 2016-01-17 MED ORDER — HYDROMORPHONE 1 MG/ML IV SOLN
INTRAVENOUS | Status: DC
  Administered 2016-01-17: 11:00:00 via INTRAVENOUS
  Administered 2016-01-17: 17.7 mg via INTRAVENOUS
  Filled 2016-01-17: qty 25

## 2016-01-17 MED ORDER — KETOROLAC TROMETHAMINE 30 MG/ML IJ SOLN
30.0000 mg | Freq: Four times a day (QID) | INTRAMUSCULAR | Status: DC
  Administered 2016-01-17: 30 mg via INTRAVENOUS
  Filled 2016-01-17: qty 1

## 2016-01-17 MED ORDER — FOLIC ACID 1 MG PO TABS: 1.0000 mg | ORAL_TABLET | Freq: Every day | ORAL | 3 refills | Status: DC

## 2016-01-17 NOTE — Telephone Encounter (Signed)
Advised caller I spoke with Dr. Hyman HopesJegede and [facility]  Patient agrees.

## 2016-01-17 NOTE — Progress Notes (Signed)
[facility] Pt was treated with IV fluids, Dilaudid PCA, and IV Toradol. Pt's pain was 8 on admission and down to 5 at discharge. Pt was alert, oriented and ambulatory at discharge. Discharge instructions given with verbal understanding.

## 2016-01-17 NOTE — Telephone Encounter (Signed)
Patient C/O pain generalized that she rates 8/10 on pain scale.  Patient states no improvement with home medications.  Patient denies chest pain, shortness of breath, no vomiting but patient does have some nausea.  Patient denies abdominal pain. I advised I would notify the provider and give her a call back.  Patient verbalizes understanding.

## 2016-01-17 NOTE — Discharge Summary (Signed)
Physician Discharge Summary  Brittany Foster ZOX:[id number] DOB: Mar 25, 1985 DOA: 01/15/2016  PCP: Jeanann Lewandowsky, MD  Admit date: 01/15/2016  Discharge date: 01/15/2016  Time spent: 30 minutes  Discharge Diagnoses:  Active Problems:   Sickle cell anemia with crisis [facility])  Discharge Condition: Stable  Diet recommendation: Regular  Filed Weights   01/15/16 0952  Weight: 128 lb (58.1 kg)   History of present illness:  Brittany Foster is a 30 y.o. female with history of sickle cell disease recently discharged from the hospital for sickle cell pain crisis, uneventful hospital stay, presented to the day hospital today with complaint of pain in her back, hip joints and legs consistent with her sickle cell pain crisis. She had called yesterday but she did not have a ride to the day hospital, then she made a ride arrangement for this morning. She rated her pain at 8 out of 10 today, she last took her MS contin, Ibuprofen, and Oxy contin at 6 am this morning with no sustained relief. She denies any fever, no urinary symptoms, no headache, no shortness of breath, no chest pain, no nausea, vomiting or diarrhea. No joint swelling or redness  Hospital Course:  Brittany Foster was admitted to the day hospital with sickle cell painful crisis. Patient was treated with her individualized care plan with IV dilaudid 2 mg Q2H, IV Toradol as well as IV fluids. Brittany Foster showed significant improvement symptomatically, pain improved from 8 to 6/10 at the time of discharge. Patient was discharged home in a hemodynamically stable condition. Brittany Foster will follow-up at the clinic as previously scheduled, continue with home medications as per prior to admission.  Discharge Instructions We discussed the need for good hydration, monitoring of hydration status, avoidance of heat, cold, stress, and infection triggers. We discussed the need to be compliant with taking Hydrea. Brittany Foster was reminded of the need to seek medical  attention of any symptoms of bleeding, anemia, or infection occurs.  Discharge Exam: Vitals:   01/15/16 0952 01/15/16 1128  BP: 116/77 105/62  Pulse: 90 94  Resp: 20 20  Temp: 98.9 F (37.2 C)    General appearance: alert, cooperative and no distress Eyes: conjunctivae/corneas clear. PERRL, EOM's intact. Fundi benign. Neck: no adenopathy, no carotid bruit, no JVD, supple, symmetrical, trachea midline and thyroid not enlarged, symmetric, no tenderness/mass/nodules Back: symmetric, no curvature. ROM normal. No CVA tenderness. Resp: clear to auscultation bilaterally Chest wall: no tenderness Cardio: regular rate and rhythm, S1, S2 normal, no murmur, click, rub or gallop GI: soft, non-tender; bowel sounds normal; no masses, no organomegaly Extremities: extremities normal, atraumatic, no cyanosis or edema Pulses: 2+ and symmetric Skin: Skin color, texture, turgor normal. No rashes or lesions Neurologic: Grossly normal  Discharge Medication List as of 01/15/2016  2:46 PM    START taking these medications   Details  oxyCODONE-acetaminophen (PERCOCET) 10-325 MG tablet Take 1 tablet by mouth every 4 (four) hours as needed for pain., Starting Wed 01/15/2016, Until Thu 01/30/2016, Print      CONTINUE these medications which have NOT CHANGED   Details  ARIPiprazole (ABILIFY) 5 MG tablet Take 1 tablet (5 mg total) by mouth 2 (two) times daily., Starting Sun 01/12/2016, Normal    DULoxetine (CYMBALTA) 60 MG capsule TAKE 1 CAPSULE BY MOUTH DAILY., Normal    folic acid (FOLVITE) 1 MG tablet Take 1 tablet (1 mg total) by mouth daily., Starting 08/08/2015, Until Discontinued, Normal    hydroxyurea (HYDREA) 500 MG capsule TAKE 2 CAPSULES BY MOUTH DAILY.  MAY TAKE WITH FOOD TO MINIMIZE GI SIDE EFFECTS., Normal    morphine (MS CONTIN) 30 MG 12 hr tablet Take 1 tablet (30 mg total) by mouth every 12 (twelve) hours., Starting Tue 12/31/2015, Print    Topiramate ER 100 MG CP24 Take 100 mg by mouth  at bedtime., Until Discontinued, Historical Med    gabapentin (NEURONTIN) 300 MG capsule TAKE 1 CAPSULE BY MOUTH 3 TIMES DAILY., Normal    potassium chloride SA (K-DUR,KLOR-CON) 20 MEQ tablet Take 2 tablets (40 mEq total) by mouth daily., Starting Sat 11/23/2015, Normal       Allergies  Allergen Reactions  . Ultram [Tramadol] Other (See Comments)    seizures  . Zofran [Ondansetron Hcl] Nausea And Vomiting  . Buprenorphine Hcl Hives and Rash    Shaking Tolerates Percocet, Norco, and buprenorphine  . Morphine And Related Hives, Rash and Other (See Comments)    Shaking Tolerates Percocet, Norco, Dilaudid, and buprenorphine  . Tape Rash     Significant Diagnostic Studies: Dg Chest 2 View  Result Date: 01/05/2016 CLINICAL DATA:  Sickle-cell crisis EXAM: CHEST  2 VIEW COMPARISON:  11/21/2015 FINDINGS: The heart size and mediastinal contours are within normal limits. Right central venous catheter tip is seen at the cavoatrial junction as before. Both lungs are clear. The visualized skeletal structures are unremarkable. Cholecystectomy clips in the right upper quadrant. IMPRESSION: No active cardiopulmonary disease. Electronically Signed   By: Tollie Ethavid  Kwon M.D.   On: 01/05/2016 23:57   Mr Hip Right Wo Contrast  Result Date: 01/11/2016 CLINICAL DATA:  SICKLE CELL DISEASE.  BILATERAL HIP PAIN.  LEG PAIN. EXAM: MR OF THE RIGHT HIP WITHOUT CONTRAST MR OF THE LEFT HIP WITHOUT CONTRAST TECHNIQUE: Multiplanar, multisequence MR imaging was performed. No intravenous contrast was administered. COMPARISON:  None. FINDINGS: Bones: Diffuse T1 and T2 hypo intense marrow throughout the lower lumbar spine, pelvis and femurs. No hip fracture, dislocation or avascular necrosis. No evidence of a bone infarct. Normal sacrum and sacroiliac joints. No SI joint widening or erosive changes. Articular cartilage and labrum Articular cartilage:  No chondral defect. Labrum: Grossly intact, but evaluation is limited by lack  of intraarticular fluid. Joint or bursal effusion Joint effusion:  No hip joint effusion.  No SI joint effusion. Bursae:  No bursa formation. Muscles and tendons Flexors: Normal. Extensors: Normal. Abductors: Normal. Adductors: Normal. Rotators: Normal. Hamstrings: Normal. Other findings Miscellaneous: Trace pelvic free fluid. No inguinal lymphadenopathy. No inguinal hernia. No adnexal mass. IMPRESSION: 1. No hip fracture, dislocation or avascular necrosis. 2. Diffuse low signal marrow throughout the visualized osseous structures consistent with patient's history of sickle cell disease. Electronically Signed   By: Elige KoHetal  Patel   On: 01/11/2016 10:14   Mr Hip Left Wo Contrast  Result Date: 01/11/2016 CLINICAL DATA:  SICKLE CELL DISEASE.  BILATERAL HIP PAIN.  LEG PAIN. EXAM: MR OF THE RIGHT HIP WITHOUT CONTRAST MR OF THE LEFT HIP WITHOUT CONTRAST TECHNIQUE: Multiplanar, multisequence MR imaging was performed. No intravenous contrast was administered. COMPARISON:  None. FINDINGS: Bones: Diffuse T1 and T2 hypo intense marrow throughout the lower lumbar spine, pelvis and femurs. No hip fracture, dislocation or avascular necrosis. No evidence of a bone infarct. Normal sacrum and sacroiliac joints. No SI joint widening or erosive changes. Articular cartilage and labrum Articular cartilage:  No chondral defect. Labrum: Grossly intact, but evaluation is limited by lack of intraarticular fluid. Joint or bursal effusion Joint effusion:  No hip joint effusion.  No SI joint effusion.  Bursae:  No bursa formation. Muscles and tendons Flexors: Normal. Extensors: Normal. Abductors: Normal. Adductors: Normal. Rotators: Normal. Hamstrings: Normal. Other findings Miscellaneous: Trace pelvic free fluid. No inguinal lymphadenopathy. No inguinal hernia. No adnexal mass. IMPRESSION: 1. No hip fracture, dislocation or avascular necrosis. 2. Diffuse low signal marrow throughout the visualized osseous structures consistent with patient's  history of sickle cell disease. Electronically Signed   By: Elige KoHetal  Patel   On: 01/11/2016 10:14   Signed:  Jeanann LewandowskyJEGEDE, Kamariya Blevens MD, MHA, FACP, FAAP, CPE   01/17/2016, 12:17 PM

## 2016-01-17 NOTE — Discharge Summary (Signed)
Physician Discharge Summary  Brittany Foster ZOX:[id number] DOB: 12/07/1985 DOA: 01/17/2016  PCP: Jeanann LewandowskyJEGEDE, Reah Justo, MD  Admit date: 01/17/2016  Discharge date: 01/17/2016  Time spent: 30 minutes  Discharge Diagnoses:  Active Problems:   Sickle cell anemia with crisis [facility](HCC)  Discharge Condition: Stable  Diet recommendation: Regular  Filed Weights   01/17/16 1016  Weight: 128 lb (58.1 kg)   History of present illness:  Brittany Foster is a 30 y.o. female with history of sickle cell disease presented to the day hospital today with complaint of pain in her back, hip joints and legs consistent with her sickle cell pain crisis. She was seen and treated for the same pain here 3 days ago. She rated her pain at 8 out of 10 today, she last took her MS contin, Ibuprofen, and Oxy contin at 6 am this morning with no sustained relief. She denies any fever, no urinary symptoms, no headache, no shortness of breath, no chest pain, no nausea, vomiting or diarrhea. No joint swelling or redness.  Hospital Course:  Brittany Foster was admitted to the day hospital with sickle cell painful crisis. Patient was treated with weight based IV Dilaudid PCA, IV Toradol as well as IV fluids. Brittany Foster showed significant improvement symptomatically, pain improved from 8 to 5/10 at the time of discharge. Patient was discharged home in a hemodynamically stable condition. Brittany Foster will follow-up at the clinic as previously scheduled, continue with home medications as per prior to admission.  Discharge Instructions We discussed the need for good hydration, monitoring of hydration status, avoidance of heat, cold, stress, and infection triggers. We discussed the need to be compliant with taking Hydrea. Brittany Foster was reminded of the need to seek medical attention of any symptoms of bleeding, anemia, or infection occurs.  Discharge Exam: Vitals:   01/17/16 1258 01/17/16 1400  BP: 119/72 108/64  Pulse: 98 (!) 103  Resp: 11 19   Temp: 98.6 F (37 C)    General appearance: alert, cooperative and no distress Eyes: conjunctivae/corneas clear. PERRL, EOM's intact. Fundi benign. Neck: no adenopathy, no carotid bruit, no JVD, supple, symmetrical, trachea midline and thyroid not enlarged, symmetric, no tenderness/mass/nodules Back: symmetric, no curvature. ROM normal. No CVA tenderness. Resp: clear to auscultation bilaterally Chest wall: no tenderness Cardio: regular rate and rhythm, S1, S2 normal, no murmur, click, rub or gallop GI: soft, non-tender; bowel sounds normal; no masses, no organomegaly Extremities: extremities normal, atraumatic, no cyanosis or edema Pulses: 2+ and symmetric Skin: Skin color, texture, turgor normal. No rashes or lesions Neurologic: Grossly normal  Current Discharge Medication List    CONTINUE these medications which have CHANGED   Details  folic acid (FOLVITE) 1 MG tablet Take 1 tablet (1 mg total) by mouth daily. 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 Qty: 60 tablet, Refills: 3    DULoxetine (CYMBALTA) 60 MG capsule TAKE 1 CAPSULE BY MOUTH DAILY. Qty: 30 capsule, Refills: 3   Associated Diagnoses: Chronic pain syndrome    hydroxyurea (HYDREA) 500 MG capsule TAKE 2 CAPSULES BY MOUTH DAILY. MAY TAKE WITH FOOD TO MINIMIZE GI SIDE EFFECTS. Qty: 60 capsule, Refills: 3   Associated Diagnoses: Hb-SS disease without crisis (HCC)    morphine (MS CONTIN) 30 MG 12 hr tablet Take 1 tablet (30  mg total) by mouth every 12 (twelve) hours. Qty: 60 tablet, Refills: 0   Associated Diagnoses: Hb-SS disease without crisis (HCC)    oxyCODONE-acetaminophen (PERCOCET) 10-325 MG tablet Take 1 tablet by mouth every 4 (four) hours as needed for pain. Qty: 90 tablet, Refills: 0    Topiramate ER  100 MG CP24 Take 100 mg by mouth at bedtime.    gabapentin (NEURONTIN) 300 MG capsule TAKE 1 CAPSULE BY MOUTH 3 TIMES DAILY. Qty: 90 capsule, Refills: 3    potassium chloride SA (K-DUR,KLOR-CON) 20 MEQ tablet Take 2 tablets (40 mEq total) by mouth daily. Qty: 30 tablet, Refills: 1       Allergies  Allergen Reactions  . Ultram [Tramadol] Other (See Comments)    seizures  . Zofran [Ondansetron Hcl] Nausea And Vomiting  . Buprenorphine Hcl Hives and Rash    Shaking Tolerates Percocet, Norco, and buprenorphine  . Morphine And Related Hives, Rash and Other (See Comments)    Shaking Tolerates Percocet, Norco, Dilaudid, and buprenorphine  . Tape Rash    Significant Diagnostic Studies: Dg Chest 2 View  Result Date: 01/05/2016 CLINICAL DATA:  Sickle-cell crisis EXAM: CHEST  2 VIEW COMPARISON:  11/21/2015 FINDINGS: The heart size and mediastinal contours are within normal limits. Right central venous catheter tip is seen at the cavoatrial junction as before. Both lungs are clear. The visualized skeletal structures are unremarkable. Cholecystectomy clips in the right upper quadrant. IMPRESSION: No active cardiopulmonary disease. Electronically Signed   By: Tollie Eth M.D.   On: 01/05/2016 23:57   Mr Hip Right Wo Contrast  Result Date: 01/11/2016 CLINICAL DATA:  SICKLE CELL DISEASE.  BILATERAL HIP PAIN.  LEG PAIN. EXAM: MR OF THE RIGHT HIP WITHOUT CONTRAST MR OF THE LEFT HIP WITHOUT CONTRAST TECHNIQUE: Multiplanar, multisequence MR imaging was performed. No intravenous contrast was administered. COMPARISON:  None. FINDINGS: Bones: Diffuse T1 and T2 hypo intense marrow throughout the lower lumbar spine, pelvis and femurs. No hip fracture, dislocation or avascular necrosis. No evidence of a bone infarct. Normal sacrum and sacroiliac joints. No SI joint widening or erosive changes. Articular cartilage and labrum Articular cartilage:  No chondral defect. Labrum: Grossly intact, but evaluation is  limited by lack of intraarticular fluid. Joint or bursal effusion Joint effusion:  No hip joint effusion.  No SI joint effusion. Bursae:  No bursa formation. Muscles and tendons Flexors: Normal. Extensors: Normal. Abductors: Normal. Adductors: Normal. Rotators: Normal. Hamstrings: Normal. Other findings Miscellaneous: Trace pelvic free fluid. No inguinal lymphadenopathy. No inguinal hernia. No adnexal mass. IMPRESSION: 1. No hip fracture, dislocation or avascular necrosis. 2. Diffuse low signal marrow throughout the visualized osseous structures consistent with patient's history of sickle cell disease. Electronically Signed   By: Elige Ko   On: 01/11/2016 10:14   Mr Hip Left Wo Contrast  Result Date: 01/11/2016 CLINICAL DATA:  SICKLE CELL DISEASE.  BILATERAL HIP PAIN.  LEG PAIN. EXAM: MR OF THE RIGHT HIP WITHOUT CONTRAST MR OF THE LEFT HIP WITHOUT CONTRAST TECHNIQUE: Multiplanar, multisequence MR imaging was performed. No intravenous contrast was administered. COMPARISON:  None. FINDINGS: Bones: Diffuse T1 and T2 hypo intense marrow throughout the lower lumbar spine, pelvis and femurs. No hip fracture, dislocation or avascular necrosis. No evidence of a bone infarct. Normal sacrum and sacroiliac joints. No SI joint widening or erosive changes. Articular cartilage and labrum Articular cartilage:  No chondral defect. Labrum: Grossly intact, but evaluation is limited by lack of intraarticular fluid. Joint or bursal effusion Joint  effusion:  No hip joint effusion.  No SI joint effusion. Bursae:  No bursa formation. Muscles and tendons Flexors: Normal. Extensors: Normal. Abductors: Normal. Adductors: Normal. Rotators: Normal. Hamstrings: Normal. Other findings Miscellaneous: Trace pelvic free fluid. No inguinal lymphadenopathy. No inguinal hernia. No adnexal mass. IMPRESSION: 1. No hip fracture, dislocation or avascular necrosis. 2. Diffuse low signal marrow throughout the visualized osseous structures  consistent with patient's history of sickle cell disease. Electronically Signed   By: Elige KoHetal  Patel   On: 01/11/2016 10:14   Signed:  Jeanann LewandowskyJEGEDE, Joany Khatib MD, MHA, FACP, FAAP, CPE   01/17/2016, 4:26 PM

## 2016-01-17 NOTE — H&P (Signed)
[facility] History and Physical  Brittany CampbellMiranda Check UJW:[id number] DOB: 04/30/1985 DOA: 01/17/2016  PCP: Jeanann LewandowskyJEGEDE, Rhapsody Wolven, MD   Chief Complaint: Sickle Cell Pain  HPI: Brittany Foster is a 30 y.o. female with history of sickle cell disease presented to the day hospital today with complaint of pain in her back, hip joints and legs consistent with her sickle cell pain crisis. She was seen and treated for the same pain here 3 days ago. She rated her pain at 8 out of 10 today, she last took her MS contin, Ibuprofen, and Oxy contin at 6 am this morning with no sustained relief. She denies any fever, no urinary symptoms, no headache, no shortness of breath, no chest pain, no nausea, vomiting or diarrhea. No joint swelling or redness.  Systemic Review: General: The patient denies anorexia, fever, weight loss Cardiac: Denies chest pain, syncope, palpitations, pedal edema  Respiratory: Denies cough, shortness of breath, wheezing GI: Denies severe indigestion/heartburn, abdominal pain, nausea, vomiting, diarrhea and constipation GU: Denies hematuria, incontinence, dysuria  Musculoskeletal: Denies arthritis  Skin: Denies suspicious skin lesions Neurologic: Denies focal weakness or numbness, change in vision  Past Medical History:  Diagnosis Date  . Anemia   . Depression, major, recurrent (HCC)   . Migraines   . Sickle cell anemia (HCC)    Past Surgical History:  Procedure Laterality Date  . CESAREAN SECTION    . CHOLECYSTECTOMY  2000  . IR GENERIC HISTORICAL  10/08/2015   IR US GUIDE VASC ACCESS RIGHT 10/08/2015 Simonne ComeJohn Watts, MD [facility]  . IR GENERIC HISTORICAL  10/08/2015   IR FLUORO GUIDE CV LINE RIGHT 10/08/2015 Simonne ComeJohn Watts, MD [facility]  . MULTIPLE TOOTH EXTRACTIONS N/A   . port a cath placement Right    about 6-7 years ago  . removal of porta cath Right 09/11/15  . TUBAL LIGATION     Allergies  Allergen Reactions  . Ultram [Tramadol] Other (See Comments)    seizures  .  Zofran [Ondansetron Hcl] Nausea And Vomiting  . Buprenorphine Hcl Hives and Rash    Shaking Tolerates Percocet, Norco, and buprenorphine  . Morphine And Related Hives, Rash and Other (See Comments)    Shaking Tolerates Percocet, Norco, Dilaudid, and buprenorphine  . Tape Rash    Family History  Problem Relation Age of Onset  . Sickle cell trait Father   . Sickle cell trait Mother   . Sickle cell anemia Other     Prior to Admission medications   Medication Sig Start Date End Date Taking? Authorizing Provider  ARIPiprazole (ABILIFY) 5 MG tablet Take 1 tablet (5 mg total) by mouth 2 (two) times daily. 01/12/16   Quentin Angstlugbemiga E Jaxtin Raimondo, MD  DULoxetine (CYMBALTA) 60 MG capsule TAKE 1 CAPSULE BY MOUTH DAILY. 11/27/15   Quentin Angstlugbemiga E Matricia Begnaud, MD  folic acid (FOLVITE) 1 MG tablet Take 1 tablet (1 mg total) by mouth daily. 08/08/15   Massie MaroonLachina M [name], FNP  gabapentin (NEURONTIN) 300 MG capsule TAKE 1 CAPSULE BY MOUTH 3 TIMES DAILY. Patient not taking: Reported on 01/15/2016 11/27/15   Quentin Angstlugbemiga E Lateshia Schmoker, MD  hydroxyurea (HYDREA) 500 MG capsule TAKE 2 CAPSULES BY MOUTH DAILY. MAY TAKE WITH FOOD TO MINIMIZE GI SIDE EFFECTS. 08/08/15   Massie MaroonLachina M [name], FNP  morphine (MS CONTIN) 30 MG 12 hr tablet Take 1 tablet (30 mg total) by mouth every 12 (twelve) hours. 12/31/15   Quentin Angstlugbemiga E Quest Tavenner, MD  oxyCODONE-acetaminophen (PERCOCET) 10-325 MG tablet Take 1 tablet by mouth  every 4 (four) hours as needed for pain. 01/15/16 01/30/16  Quentin Angstlugbemiga E Helen Winterhalter, MD  potassium chloride SA (K-DUR,KLOR-CON) 20 MEQ tablet Take 2 tablets (40 mEq total) by mouth daily. Patient not taking: Reported on 01/15/2016 11/23/15   Altha HarmMichelle A [name], MD  Topiramate ER 100 MG CP24 Take 100 mg by mouth at bedtime.    Historical Provider, MD   Physical Exam: Vitals:   01/17/16 1016  BP: 113/75  Pulse: 97  Resp: 18  Temp: 99 F (37.2 C)  TempSrc: Oral  SpO2: 98%  Weight: 128 lb (58.1 kg)  Height: 5' (1.524 m)   General: Alert,  awake, afebrile, anicteric, not in obvious distress HEENT: Normocephalic and Atraumatic, Mucous membranes pink                PERRLA; EOM intact; No scleral icterus,                 Nares: Patent, Oropharynx: Clear, Fair Dentition                 Neck: FROM, no cervical lymphadenopathy, thyromegaly, carotid bruit or JVD;  CHEST WALL: No tenderness  CHEST: Normal respiration, clear to auscultation bilaterally  HEART: Regular rate and rhythm; no murmurs rubs or gallops  BACK: No kyphosis or scoliosis; no CVA tenderness  ABDOMEN: Positive Bowel Sounds, soft, non-tender; no masses, no organomegaly EXTREMITIES: No cyanosis, clubbing, or edema SKIN:  no rash or ulceration  CNS: Alert and Oriented x 4, Nonfocal exam, CN 2-12 intact  Labs on Admission:  Basic Metabolic Panel: No results for input(s): NA, K, CL, CO2, GLUCOSE, BUN, CREATININE, CALCIUM, MG, PHOS in the last 168 hours. Liver Function Tests: No results for input(s): AST, ALT, ALKPHOS, BILITOT, PROT, ALBUMIN in the last 168 hours. No results for input(s): LIPASE, AMYLASE in the last 168 hours. No results for input(s): AMMONIA in the last 168 hours. CBC:  Recent Labs Lab 01/10/16 1214 01/15/16 1002  WBC 9.1 8.4  NEUTROABS 4.9 5.5  HGB 7.1* 8.7*  HCT 20.6* 24.1*  MCV 97.6 100.0  PLT 274 490*   Cardiac Enzymes: No results for input(s): CKTOTAL, CKMB, CKMBINDEX, TROPONINI in the last 168 hours.  BNP (last 3 results) No results for input(s): BNP in the last 8760 hours.  ProBNP (last 3 results) No results for input(s): PROBNP in the last 8760 hours.  CBG: No results for input(s): GLUCAP in the last 168 hours.   Assessment/Plan Active Problems:   Sickle cell anemia with crisis (HCC)   Admits to the Day Hospital  IVF D5 .45% Saline @ 125 mls/hour  Weight based Dilaudid PCA started within 30 minutes of admission  IV Toradol 30 mg Q 6 H  Monitor vitals very closely, Re-evaluate pain scale every hour  2 L of  Oxygen by [place]  Patient will be re-evaluated for pain in the context of function and relationship to baseline as care progresses.  If no significant relieve from pain (remains above 5/10) will transfer patient to inpatient services for further evaluation and management  Code Status: Full  Family Communication: None  DVT Prophylaxis: Ambulate as tolerated   Time spent: 35 Minutes  Justene Jensen, MD, MHA, FACP, FAAP, CPE  If 7PM-7AM, please contact night-coverage [email or web address] 01/17/2016, 10:22 AM

## 2016-01-17 NOTE — Discharge Instructions (Signed)
Sickle Cell Anemia, Adult °Sickle cell anemia is a condition where your red blood cells are shaped like sickles. Red blood cells carry oxygen through the body. Sickle-shaped red blood cells do not live as long as normal red blood cells. They also clump together and block blood from flowing through the blood vessels. These things prevent the body from getting enough oxygen. Sickle cell anemia causes organ damage and pain. It also increases the risk of infection. °Follow these instructions at home: °· Drink enough fluid to keep your pee (urine) clear or pale yellow. Drink more in hot weather and during exercise. °· Do not smoke. Smoking lowers oxygen levels in the blood. °· Only take over-the-counter or prescription medicines as told by your doctor. °· Take antibiotic medicines as told by your doctor. Make sure you finish them even if you start to feel better. °· Take supplements as told by your doctor. °· Consider wearing a medical alert bracelet. This tells anyone caring for you in an emergency of your condition. °· When traveling, keep your medical information, doctors' names, and the medicines you take with you at all times. °· If you have a fever, do not take fever medicines right away. This could cover up a problem. Tell your doctor. °· Keep all follow-up visits with your doctor. Sickle cell anemia requires regular medical care. °Contact a doctor if: °You have a fever. °Get help right away if: °· You feel dizzy or faint. °· You have new belly (abdominal) pain, especially on the left side near the stomach area. °· You have a lasting, often uncomfortable and painful erection of the penis (priapism). If it is not treated right away, you will become unable to have sex (impotence). °· You have numbness in your arms or legs or you have a hard time moving them. °· You have a hard time talking. °· You have a fever or lasting symptoms for more than 2-3 days. °· You have a fever and your symptoms suddenly get  worse. °· You have signs or symptoms of infection. These include: °? Chills. °? Being more tired than normal (lethargy). °? Irritability. °? Poor eating. °? Throwing up (vomiting). °· You have pain that is not helped with medicine. °· You have shortness of breath. °· You have pain in your chest. °· You are coughing up pus-like or bloody mucus. °· You have a stiff neck. °· Your feet or hands swell or have pain. °· Your belly looks bloated. °· Your joints hurt. °This information is not intended to replace advice given to you by your health care provider. Make sure you discuss any questions you have with your health care provider. °Document Released: 12/07/2012 Document Revised: 07/25/2015 Document Reviewed: 09/28/2012 °Elsevier Interactive Patient Education © 2017 [employer] ° °

## 2016-01-21 ENCOUNTER — Non-Acute Institutional Stay (HOSPITAL_COMMUNITY)
Admission: AD | Admit: 2016-01-21 | Discharge: 2016-01-21 | Disposition: A | Discharge status: Home / Self Care | Payer: Medicaid Other | Source: Ambulatory Visit | Attending: Internal Medicine | Admitting: Internal Medicine

## 2016-01-21 ENCOUNTER — Encounter (HOSPITAL_COMMUNITY): Payer: Self-pay | Admitting: *Deleted

## 2016-01-21 DIAGNOSIS — G894 Chronic pain syndrome: Secondary | ICD-10-CM | POA: Diagnosis not present

## 2016-01-21 DIAGNOSIS — Z832 Family history of diseases of the blood and blood-forming organs and certain disorders involving the immune mechanism: Secondary | ICD-10-CM | POA: Diagnosis not present

## 2016-01-21 DIAGNOSIS — D5700 Hb-SS disease with crisis, unspecified: Secondary | ICD-10-CM | POA: Insufficient documentation

## 2016-01-21 DIAGNOSIS — Z79899 Other long term (current) drug therapy: Secondary | ICD-10-CM | POA: Insufficient documentation

## 2016-01-21 LAB — CBC WITH DIFFERENTIAL/PLATELET
BASOS ABS: 0 10*3/uL (ref 0.0–0.1)
Basophils Relative: 0 %
EOS ABS: 0.1 10*3/uL (ref 0.0–0.7)
Eosinophils Relative: 1 %
HCT: 23.9 % — ABNORMAL LOW (ref 36.0–46.0)
Hemoglobin: 8.4 g/dL — ABNORMAL LOW (ref 12.0–15.0)
LYMPHS PCT: 34 %
Lymphs Abs: 4.3 10*3/uL — ABNORMAL HIGH (ref 0.7–4.0)
MCH: 36.1 pg — AB (ref 26.0–34.0)
MCHC: 35.1 g/dL (ref 30.0–36.0)
MCV: 102.6 fL — ABNORMAL HIGH (ref 78.0–100.0)
MONO ABS: 1 10*3/uL (ref 0.1–1.0)
Monocytes Relative: 8 %
NEUTROS PCT: 57 %
Neutro Abs: 7.1 10*3/uL (ref 1.7–7.7)
PLATELETS: 355 10*3/uL (ref 150–400)
RBC: 2.33 MIL/uL — ABNORMAL LOW (ref 3.87–5.11)
RDW: 22.4 % — ABNORMAL HIGH (ref 11.5–15.5)
WBC: 12.5 10*3/uL — AB (ref 4.0–10.5)
nRBC: 2 /100 WBC — ABNORMAL HIGH

## 2016-01-21 LAB — RETICULOCYTES
RBC.: 2.33 MIL/uL — ABNORMAL LOW (ref 3.87–5.11)
RETIC CT PCT: 12.5 % — AB (ref 0.4–3.1)
Retic Count, Absolute: 291.3 10*3/uL — ABNORMAL HIGH (ref 19.0–186.0)

## 2016-01-21 MED ORDER — SODIUM CHLORIDE 0.9 % IV SOLN
25.0000 mg | INTRAVENOUS | Status: DC | PRN
  Filled 2016-01-21: qty 0.5

## 2016-01-21 MED ORDER — SODIUM CHLORIDE 0.9% FLUSH
10.0000 mL | INTRAVENOUS | Status: AC | PRN
  Administered 2016-01-21: 10 mL

## 2016-01-21 MED ORDER — DIPHENHYDRAMINE HCL 25 MG PO CAPS
25.0000 mg | ORAL_CAPSULE | ORAL | Status: DC | PRN
  Administered 2016-01-21 (×2): 25 mg via ORAL
  Filled 2016-01-21 (×2): qty 1

## 2016-01-21 MED ORDER — SODIUM CHLORIDE 0.9% FLUSH: 9.0000 mL | INTRAVENOUS | Status: DC | PRN

## 2016-01-21 MED ORDER — DEXTROSE-NACL 5-0.45 % IV SOLN
INTRAVENOUS | Status: DC
  Administered 2016-01-21: 09:00:00 via INTRAVENOUS

## 2016-01-21 MED ORDER — HYDROMORPHONE 1 MG/ML IV SOLN
INTRAVENOUS | Status: DC
  Administered 2016-01-21: 16 mg via INTRAVENOUS
  Administered 2016-01-21: 09:00:00 via INTRAVENOUS
  Administered 2016-01-21: 5.5 mg via INTRAVENOUS
  Filled 2016-01-21: qty 25

## 2016-01-21 MED ORDER — POLYETHYLENE GLYCOL 3350 17 G PO PACK: 17.0000 g | PACK | Freq: Every day | ORAL | Status: DC | PRN

## 2016-01-21 MED ORDER — ONDANSETRON HCL 4 MG/2ML IJ SOLN: 4.0000 mg | Freq: Four times a day (QID) | INTRAMUSCULAR | Status: DC | PRN

## 2016-01-21 MED ORDER — HEPARIN SOD (PORK) LOCK FLUSH 100 UNIT/ML IV SOLN
250.0000 [IU] | INTRAVENOUS | Status: AC | PRN
  Administered 2016-01-21: 250 [IU]
  Filled 2016-01-21: qty 5

## 2016-01-21 MED ORDER — HYDROMORPHONE HCL 2 MG/ML IJ SOLN
2.0000 mg | Freq: Once | INTRAMUSCULAR | Status: AC
  Administered 2016-01-21: 2 mg via INTRAVENOUS
  Filled 2016-01-21: qty 1

## 2016-01-21 MED ORDER — KETOROLAC TROMETHAMINE 30 MG/ML IJ SOLN
30.0000 mg | Freq: Four times a day (QID) | INTRAMUSCULAR | Status: DC
  Administered 2016-01-21: 30 mg via INTRAVENOUS
  Filled 2016-01-21: qty 1

## 2016-01-21 MED ORDER — NALOXONE HCL 0.4 MG/ML IJ SOLN: 0.4000 mg | INTRAMUSCULAR | Status: DC | PRN

## 2016-01-21 MED ORDER — SENNOSIDES-DOCUSATE SODIUM 8.6-50 MG PO TABS: 1.0000 | ORAL_TABLET | Freq: Two times a day (BID) | ORAL | Status: DC

## 2016-01-21 NOTE — Progress Notes (Signed)
Patient ID: Brittany CampbellMiranda Foster, female   DOB: 03/29/1985, 30 y.o.   MRN: [id number] [facility] of 9/10 generalized pain. Treated with IV fluids, IV Toradol and PCA Dilaudid and IV push Dilaudid. Pain level down to 6/10 at time of discharge. Patient requesting to go home.Micah Flesher. Went over discharge instructions and copy given to patient. Alert, oriented and ambulatory at time of discharge. Called for a ride home. Discharged.

## 2016-01-21 NOTE — Discharge Instructions (Signed)
Sickle Cell Anemia, Adult °Sickle cell anemia is a condition where your red blood cells are shaped like sickles. Red blood cells carry oxygen through the body. Sickle-shaped red blood cells do not live as long as normal red blood cells. They also clump together and block blood from flowing through the blood vessels. These things prevent the body from getting enough oxygen. Sickle cell anemia causes organ damage and pain. It also increases the risk of infection. °Follow these instructions at home: °· Drink enough fluid to keep your pee (urine) clear or pale yellow. Drink more in hot weather and during exercise. °· Do not smoke. Smoking lowers oxygen levels in the blood. °· Only take over-the-counter or prescription medicines as told by your doctor. °· Take antibiotic medicines as told by your doctor. Make sure you finish them even if you start to feel better. °· Take supplements as told by your doctor. °· Consider wearing a medical alert bracelet. This tells anyone caring for you in an emergency of your condition. °· When traveling, keep your medical information, doctors' names, and the medicines you take with you at all times. °· If you have a fever, do not take fever medicines right away. This could cover up a problem. Tell your doctor. °· Keep all follow-up visits with your doctor. Sickle cell anemia requires regular medical care. °Contact a doctor if: °You have a fever. °Get help right away if: °· You feel dizzy or faint. °· You have new belly (abdominal) pain, especially on the left side near the stomach area. °· You have a lasting, often uncomfortable and painful erection of the penis (priapism). If it is not treated right away, you will become unable to have sex (impotence). °· You have numbness in your arms or legs or you have a hard time moving them. °· You have a hard time talking. °· You have a fever or lasting symptoms for more than 2-3 days. °· You have a fever and your symptoms suddenly get  worse. °· You have signs or symptoms of infection. These include: °? Chills. °? Being more tired than normal (lethargy). °? Irritability. °? Poor eating. °? Throwing up (vomiting). °· You have pain that is not helped with medicine. °· You have shortness of breath. °· You have pain in your chest. °· You are coughing up pus-like or bloody mucus. °· You have a stiff neck. °· Your feet or hands swell or have pain. °· Your belly looks bloated. °· Your joints hurt. °This information is not intended to replace advice given to you by your health care provider. Make sure you discuss any questions you have with your health care provider. °Document Released: 12/07/2012 Document Revised: 07/25/2015 Document Reviewed: 09/28/2012 °Elsevier Interactive Patient Education © 2017 [employer] ° °

## 2016-01-21 NOTE — H&P (Signed)
[facility] History and Physical  Brittany Foster Krauter [id number] DOB: 08/07/1985 DOA: 01/21/2016  PCP: Jeanann LewandowskyJEGEDE, Joevanni Roddey, MD   Chief Complaint: Pain all over  HPI: Brittany Foster Arata is a 30 y.o. female with history of sickle cell disease presented to the day hospital today with complaint of pain in her back, hip joints and legs consistent with her sickle cell pain crisis. She was seen and treated for the same pain here on 01/17/2016. She rated her pain at 8 out of 10 today, she last took her MS contin, Ibuprofen, and Oxy contin this morning with no sustained relief. She denies any fever, no urinary symptoms, no headache, no shortness of breath, no chest pain, no nausea, vomiting or diarrhea. No joint swelling or redness.  Systemic Review: General: The patient denies anorexia, fever, weight loss Cardiac: Denies chest pain, syncope, palpitations, pedal edema  Respiratory: Denies cough, shortness of breath, wheezing GI: Denies severe indigestion/heartburn, abdominal pain, nausea, vomiting, diarrhea and constipation GU: Denies hematuria, incontinence, dysuria  Musculoskeletal: Denies arthritis  Skin: Denies suspicious skin lesions Neurologic: Denies focal weakness or numbness, change in vision  Past Medical History:  Diagnosis Date  . Anemia   . Depression, major, recurrent (HCC)   . Migraines   . Sickle cell anemia (HCC)     Past Surgical History:  Procedure Laterality Date  . CESAREAN SECTION    . CHOLECYSTECTOMY  2000  . IR GENERIC HISTORICAL  10/08/2015   IR US GUIDE VASC ACCESS RIGHT 10/08/2015 Simonne ComeJohn Watts, MD [facility]  . IR GENERIC HISTORICAL  10/08/2015   IR FLUORO GUIDE CV LINE RIGHT 10/08/2015 Simonne ComeJohn Watts, MD [facility]  . MULTIPLE TOOTH EXTRACTIONS N/A   . port a cath placement Right    about 6-7 years ago  . removal of porta cath Right 09/11/15  . TUBAL LIGATION      Allergies  Allergen Reactions  . Ultram [Tramadol] Other (See Comments)    seizures  .  Zofran [Ondansetron Hcl] Nausea And Vomiting  . Buprenorphine Hcl Hives and Rash    Shaking Tolerates Percocet, Norco, and buprenorphine  . Morphine And Related Hives, Rash and Other (See Comments)    Shaking Tolerates Percocet, Norco, Dilaudid, and buprenorphine  . Tape Rash    Family History  Problem Relation Age of Onset  . Sickle cell trait Father   . Sickle cell trait Mother   . Sickle cell anemia Other       Prior to Admission medications   Medication Sig Start Date End Date Taking? Authorizing Provider  ARIPiprazole (ABILIFY) 5 MG tablet Take 1 tablet (5 mg total) by mouth 2 (two) times daily. 01/12/16   Quentin Angstlugbemiga E Velita Quirk, MD  DULoxetine (CYMBALTA) 60 MG capsule TAKE 1 CAPSULE BY MOUTH DAILY. 11/27/15   Quentin Angstlugbemiga E Howard Bunte, MD  folic acid (FOLVITE) 1 MG tablet Take 1 tablet (1 mg total) by mouth daily. 01/17/16   Quentin Angstlugbemiga E Sven Pinheiro, MD  gabapentin (NEURONTIN) 300 MG capsule TAKE 1 CAPSULE BY MOUTH 3 TIMES DAILY. Patient not taking: Reported on 01/15/2016 11/27/15   Quentin Angstlugbemiga E Kushi Kun, MD  hydroxyurea (HYDREA) 500 MG capsule TAKE 2 CAPSULES BY MOUTH DAILY. MAY TAKE WITH FOOD TO MINIMIZE GI SIDE EFFECTS. 08/08/15   Massie MaroonLachina M [name], FNP  morphine (MS CONTIN) 30 MG 12 hr tablet Take 1 tablet (30 mg total) by mouth every 12 (twelve) hours. 12/31/15   Quentin Angstlugbemiga E Shaquile Lutze, MD  oxyCODONE-acetaminophen (PERCOCET) 10-325 MG tablet Take 1 tablet by mouth  every 4 (four) hours as needed for pain. 01/15/16 01/30/16  Quentin Angstlugbemiga E Aanyah Loa, MD  potassium chloride SA (K-DUR,KLOR-CON) 20 MEQ tablet Take 2 tablets (40 mEq total) by mouth daily. Patient not taking: Reported on 01/15/2016 11/23/15   Altha HarmMichelle A [name], MD  Topiramate ER 100 MG CP24 Take 100 mg by mouth at bedtime.    Historical Provider, MD     Physical Exam: There were no vitals filed for this visit.  General: Alert, awake, afebrile, anicteric, not in obvious distress HEENT: Normocephalic and Atraumatic, Mucous membranes  pink                PERRLA; EOM intact; No scleral icterus,                 Nares: Patent, Oropharynx: Clear, Fair Dentition                 Neck: FROM, no cervical lymphadenopathy, thyromegaly, carotid bruit or JVD;  CHEST WALL: No tenderness  CHEST: Normal respiration, clear to auscultation bilaterally  HEART: Regular rate and rhythm; no murmurs rubs or gallops  BACK: No kyphosis or scoliosis; no CVA tenderness  ABDOMEN: Positive Bowel Sounds, soft, non-tender; no masses, no organomegaly EXTREMITIES: No cyanosis, clubbing, or edema SKIN:  no rash or ulceration  CNS: Alert and Oriented x 4, Nonfocal exam, CN 2-12 intact  Labs on Admission:  Basic Metabolic Panel: No results for input(s): NA, K, CL, CO2, GLUCOSE, BUN, CREATININE, CALCIUM, MG, PHOS in the last 168 hours. Liver Function Tests: No results for input(s): AST, ALT, ALKPHOS, BILITOT, PROT, ALBUMIN in the last 168 hours. No results for input(s): LIPASE, AMYLASE in the last 168 hours. No results for input(s): AMMONIA in the last 168 hours. CBC:  Recent Labs Lab 01/15/16 1002  WBC 8.4  NEUTROABS 5.5  HGB 8.7*  HCT 24.1*  MCV 100.0  PLT 490*   Cardiac Enzymes: No results for input(s): CKTOTAL, CKMB, CKMBINDEX, TROPONINI in the last 168 hours.  BNP (last 3 results) No results for input(s): BNP in the last 8760 hours.  ProBNP (last 3 results) No results for input(s): PROBNP in the last 8760 hours.  CBG: No results for input(s): GLUCAP in the last 168 hours.   Assessment/Plan Active Problems:   Sickle cell anemia with crisis (HCC)   Admits to the Day Hospital  IVF D5 .45% Saline @ 125 mls/hour  Weight based Dilaudid PCA started within 30 minutes of admission plus clinician assisted doses of IV Dilaudid  IV Toradol 30 mg Q 6 H  Monitor vitals very closely, Re-evaluate pain scale every hour  2 L of Oxygen by [place]  Patient will be re-evaluated for pain in the context of function and relationship to baseline  as care progresses.  If no significant relieve from pain (remains above 5/10) will transfer patient to inpatient services for further evaluation and management  Code Status: Full  Family Communication: None  DVT Prophylaxis: Ambulate as tolerated   Time spent: 35 Minutes  Khadeja Abt, MD, MHA, FACP, FAAP, CPE  If 7PM-7AM, please contact night-coverage [email or web address] 01/21/2016, 8:20 AM

## 2016-01-21 NOTE — Discharge Summary (Signed)
Physician Discharge Summary  Brittany CampbellMiranda Mauriello WUJ:[id number] DOB: 07/11/1985 DOA: 01/21/2016  PCP: Jeanann LewandowskyJEGEDE, Ameenah Prosser, MD  Admit date: 01/21/2016  Discharge date: 01/21/2016  Time spent: 30 minutes  Discharge Diagnoses:  Active Problems:   Sickle cell anemia with crisis [facility](HCC)  Discharge Condition: Stable  Diet recommendation: Regular  Filed Weights   01/21/16 0821  Weight: 125 lb (56.7 kg)   History of present illness:  Brittany Foster is a 30 y.o. female with history of sickle cell disease presented to the day hospital today with complaint of pain in her back, hip joints and legs consistent with her sickle cell pain crisis. She was seen and treated for the same pain here on 01/17/2016. She rated her pain at 8 out of 10 today, she last took her MS contin, Ibuprofen, and Oxy contin this morning with no sustained relief. She denies any fever, no urinary symptoms, no headache, no shortness of breath, no chest pain, no nausea, vomiting or diarrhea. No joint swelling or redness.  Hospital Course:  Brittany CampbellMiranda Quinney was admitted to the day hospital with sickle cell painful crisis. Patient was treated with weight based IV Dilaudid PCA, IV Toradol as well as IV fluids. Tarrah showed significant improvement symptomatically, pain improved from 8 to 6/10 at the time of discharge. Patient was discharged home in a hemodynamically stable condition. Juliyah will follow-up at the clinic as previously scheduled, continue with home medications as per prior to admission.  Discharge Instructions We discussed the need for good hydration, monitoring of hydration status, avoidance of heat, cold, stress, and infection triggers. We discussed the need to be compliant with taking Hydrea. Tyrah was reminded of the need to seek medical attention of any symptoms of bleeding, anemia, or infection occurs.  Discharge Exam: Vitals:   01/21/16 1447 01/21/16 1608  BP:    Pulse:  83  Resp: 13 11  Temp:     General  appearance: alert, cooperative and no distress Eyes: conjunctivae/corneas clear. PERRL, EOM's intact. Fundi benign. Neck: no adenopathy, no carotid bruit, no JVD, supple, symmetrical, trachea midline and thyroid not enlarged, symmetric, no tenderness/mass/nodules Back: symmetric, no curvature. ROM normal. No CVA tenderness. Resp: clear to auscultation bilaterally Chest wall: no tenderness Cardio: regular rate and rhythm, S1, S2 normal, no murmur, click, rub or gallop GI: soft, non-tender; bowel sounds normal; no masses, no organomegaly Extremities: extremities normal, atraumatic, no cyanosis or edema Pulses: 2+ and symmetric Skin: Skin color, texture, turgor normal. No rashes or lesions Neurologic: Grossly normal  Current Discharge Medication List    CONTINUE these medications which have NOT CHANGED   Details  ARIPiprazole (ABILIFY) 5 MG tablet Take 1 tablet (5 mg total) by mouth 2 (two) times daily. Qty: 60 tablet, Refills: 3    DULoxetine (CYMBALTA) 60 MG capsule TAKE 1 CAPSULE BY MOUTH DAILY. Qty: 30 capsule, Refills: 3   Associated Diagnoses: Chronic pain syndrome    folic acid (FOLVITE) 1 MG tablet Take 1 tablet (1 mg total) by mouth daily. Qty: 90 tablet, Refills: 3   Associated Diagnoses: Hb-SS disease without crisis (HCC)    hydroxyurea (HYDREA) 500 MG capsule TAKE 2 CAPSULES BY MOUTH DAILY. MAY TAKE WITH FOOD TO MINIMIZE GI SIDE EFFECTS. Qty: 60 capsule, Refills: 3   Associated Diagnoses: Hb-SS disease without crisis (HCC)    morphine (MS CONTIN) 30 MG 12 hr tablet Take 1 tablet (30 mg total) by mouth every 12 (twelve) hours. Qty: 60 tablet, Refills: 0   Associated Diagnoses: Hb-SS disease without  crisis (HCC)    oxyCODONE-acetaminophen (PERCOCET) 10-325 MG tablet Take 1 tablet by mouth every 4 (four) hours as needed for pain. Qty: 90 tablet, Refills: 0    Topiramate ER 100 MG CP24 Take 100 mg by mouth at bedtime.    gabapentin (NEURONTIN) 300 MG capsule TAKE 1  CAPSULE BY MOUTH 3 TIMES DAILY. Qty: 90 capsule, Refills: 3    potassium chloride SA (K-DUR,KLOR-CON) 20 MEQ tablet Take 2 tablets (40 mEq total) by mouth daily. Qty: 30 tablet, Refills: 1       Allergies  Allergen Reactions  . Ultram [Tramadol] Other (See Comments)    seizures  . Zofran [Ondansetron Hcl] Nausea And Vomiting  . Buprenorphine Hcl Hives and Rash    Shaking Tolerates Percocet, Norco, and buprenorphine  . Morphine And Related Hives, Rash and Other (See Comments)    Shaking Tolerates Percocet, Norco, Dilaudid, and buprenorphine  . Tape Rash    Significant Diagnostic Studies: Dg Chest 2 View  Result Date: 01/05/2016 CLINICAL DATA:  Sickle-cell crisis EXAM: CHEST  2 VIEW COMPARISON:  11/21/2015 FINDINGS: The heart size and mediastinal contours are within normal limits. Right central venous catheter tip is seen at the cavoatrial junction as before. Both lungs are clear. The visualized skeletal structures are unremarkable. Cholecystectomy clips in the right upper quadrant. IMPRESSION: No active cardiopulmonary disease. Electronically Signed   By: Tollie Eth M.D.   On: 01/05/2016 23:57   Mr Hip Right Wo Contrast  Result Date: 01/11/2016 CLINICAL DATA:  SICKLE CELL DISEASE.  BILATERAL HIP PAIN.  LEG PAIN. EXAM: MR OF THE RIGHT HIP WITHOUT CONTRAST MR OF THE LEFT HIP WITHOUT CONTRAST TECHNIQUE: Multiplanar, multisequence MR imaging was performed. No intravenous contrast was administered. COMPARISON:  None. FINDINGS: Bones: Diffuse T1 and T2 hypo intense marrow throughout the lower lumbar spine, pelvis and femurs. No hip fracture, dislocation or avascular necrosis. No evidence of a bone infarct. Normal sacrum and sacroiliac joints. No SI joint widening or erosive changes. Articular cartilage and labrum Articular cartilage:  No chondral defect. Labrum: Grossly intact, but evaluation is limited by lack of intraarticular fluid. Joint or bursal effusion Joint effusion:  No hip joint  effusion.  No SI joint effusion. Bursae:  No bursa formation. Muscles and tendons Flexors: Normal. Extensors: Normal. Abductors: Normal. Adductors: Normal. Rotators: Normal. Hamstrings: Normal. Other findings Miscellaneous: Trace pelvic free fluid. No inguinal lymphadenopathy. No inguinal hernia. No adnexal mass. IMPRESSION: 1. No hip fracture, dislocation or avascular necrosis. 2. Diffuse low signal marrow throughout the visualized osseous structures consistent with patient's history of sickle cell disease. Electronically Signed   By: Elige Ko   On: 01/11/2016 10:14   Mr Hip Left Wo Contrast  Result Date: 01/11/2016 CLINICAL DATA:  SICKLE CELL DISEASE.  BILATERAL HIP PAIN.  LEG PAIN. EXAM: MR OF THE RIGHT HIP WITHOUT CONTRAST MR OF THE LEFT HIP WITHOUT CONTRAST TECHNIQUE: Multiplanar, multisequence MR imaging was performed. No intravenous contrast was administered. COMPARISON:  None. FINDINGS: Bones: Diffuse T1 and T2 hypo intense marrow throughout the lower lumbar spine, pelvis and femurs. No hip fracture, dislocation or avascular necrosis. No evidence of a bone infarct. Normal sacrum and sacroiliac joints. No SI joint widening or erosive changes. Articular cartilage and labrum Articular cartilage:  No chondral defect. Labrum: Grossly intact, but evaluation is limited by lack of intraarticular fluid. Joint or bursal effusion Joint effusion:  No hip joint effusion.  No SI joint effusion. Bursae:  No bursa formation. Muscles and tendons Flexors:  Normal. Extensors: Normal. Abductors: Normal. Adductors: Normal. Rotators: Normal. Hamstrings: Normal. Other findings Miscellaneous: Trace pelvic free fluid. No inguinal lymphadenopathy. No inguinal hernia. No adnexal mass. IMPRESSION: 1. No hip fracture, dislocation or avascular necrosis. 2. Diffuse low signal marrow throughout the visualized osseous structures consistent with patient's history of sickle cell disease. Electronically Signed   By: Elige KoHetal  Patel   On:  01/11/2016 10:14   Signed:  Jeanann LewandowskyJEGEDE, Aylana Hirschfeld MD, MHA, FACP, FAAP, CPE   01/21/2016, 4:30 PM

## 2016-01-22 ENCOUNTER — Emergency Department (HOSPITAL_COMMUNITY): Payer: Medicaid Other

## 2016-01-22 ENCOUNTER — Encounter (HOSPITAL_COMMUNITY): Payer: Self-pay | Admitting: Emergency Medicine

## 2016-01-22 ENCOUNTER — Inpatient Hospital Stay (HOSPITAL_COMMUNITY)
Admission: EM | Admit: 2016-01-22 | Discharge: 2016-01-28 | DRG: 812 | Discharge status: Against Medical Advice | Payer: Medicaid Other | Attending: Internal Medicine | Admitting: Internal Medicine

## 2016-01-22 DIAGNOSIS — D5700 Hb-SS disease with crisis, unspecified: Principal | ICD-10-CM | POA: Diagnosis present

## 2016-01-22 DIAGNOSIS — R079 Chest pain, unspecified: Secondary | ICD-10-CM | POA: Diagnosis present

## 2016-01-22 DIAGNOSIS — R52 Pain, unspecified: Secondary | ICD-10-CM | POA: Diagnosis present

## 2016-01-22 DIAGNOSIS — E876 Hypokalemia: Secondary | ICD-10-CM | POA: Diagnosis present

## 2016-01-22 DIAGNOSIS — F1120 Opioid dependence, uncomplicated: Secondary | ICD-10-CM | POA: Diagnosis present

## 2016-01-22 DIAGNOSIS — I959 Hypotension, unspecified: Secondary | ICD-10-CM | POA: Diagnosis present

## 2016-01-22 DIAGNOSIS — Z9114 Patient's other noncompliance with medication regimen: Secondary | ICD-10-CM

## 2016-01-22 DIAGNOSIS — G629 Polyneuropathy, unspecified: Secondary | ICD-10-CM

## 2016-01-22 DIAGNOSIS — R110 Nausea: Secondary | ICD-10-CM

## 2016-01-22 DIAGNOSIS — F418 Other specified anxiety disorders: Secondary | ICD-10-CM | POA: Diagnosis present

## 2016-01-22 DIAGNOSIS — Z9049 Acquired absence of other specified parts of digestive tract: Secondary | ICD-10-CM

## 2016-01-22 DIAGNOSIS — Z79899 Other long term (current) drug therapy: Secondary | ICD-10-CM

## 2016-01-22 DIAGNOSIS — G894 Chronic pain syndrome: Secondary | ICD-10-CM | POA: Diagnosis present

## 2016-01-22 DIAGNOSIS — F339 Major depressive disorder, recurrent, unspecified: Secondary | ICD-10-CM | POA: Diagnosis present

## 2016-01-22 DIAGNOSIS — Z87891 Personal history of nicotine dependence: Secondary | ICD-10-CM

## 2016-01-22 DIAGNOSIS — R1110 Vomiting, unspecified: Secondary | ICD-10-CM

## 2016-01-22 DIAGNOSIS — F332 Major depressive disorder, recurrent severe without psychotic features: Secondary | ICD-10-CM | POA: Diagnosis present

## 2016-01-22 LAB — CBC WITH DIFFERENTIAL/PLATELET
BASOS PCT: 1 %
Basophils Absolute: 0.1 10*3/uL (ref 0.0–0.1)
EOS PCT: 1 %
Eosinophils Absolute: 0.1 10*3/uL (ref 0.0–0.7)
HEMATOCRIT: 24.1 % — AB (ref 36.0–46.0)
Hemoglobin: 8.6 g/dL — ABNORMAL LOW (ref 12.0–15.0)
LYMPHS ABS: 3.6 10*3/uL (ref 0.7–4.0)
Lymphocytes Relative: 32 %
MCH: 36.6 pg — ABNORMAL HIGH (ref 26.0–34.0)
MCHC: 35.7 g/dL (ref 30.0–36.0)
MCV: 102.6 fL — AB (ref 78.0–100.0)
MONOS PCT: 10 %
Monocytes Absolute: 1.1 10*3/uL — ABNORMAL HIGH (ref 0.1–1.0)
NEUTROS ABS: 6.2 10*3/uL (ref 1.7–7.7)
Neutrophils Relative %: 56 %
Platelets: 313 10*3/uL (ref 150–400)
RBC: 2.35 MIL/uL — ABNORMAL LOW (ref 3.87–5.11)
RDW: 21.7 % — AB (ref 11.5–15.5)
WBC: 11.1 10*3/uL — ABNORMAL HIGH (ref 4.0–10.5)

## 2016-01-22 LAB — RETICULOCYTES
RBC.: 2.35 MIL/uL — ABNORMAL LOW (ref 3.87–5.11)
Retic Count, Absolute: 329 10*3/uL — ABNORMAL HIGH (ref 19.0–186.0)
Retic Ct Pct: 14 % — ABNORMAL HIGH (ref 0.4–3.1)

## 2016-01-22 LAB — COMPREHENSIVE METABOLIC PANEL
ALBUMIN: 4.1 g/dL (ref 3.5–5.0)
ALT: 19 U/L (ref 14–54)
AST: 37 U/L (ref 15–41)
Alkaline Phosphatase: 78 U/L (ref 38–126)
Anion gap: 7 (ref 5–15)
BILIRUBIN TOTAL: 2.1 mg/dL — AB (ref 0.3–1.2)
BUN: 6 mg/dL (ref 6–20)
CHLORIDE: 106 mmol/L (ref 101–111)
CO2: 27 mmol/L (ref 22–32)
Calcium: 8.6 mg/dL — ABNORMAL LOW (ref 8.9–10.3)
Creatinine, Ser: 0.47 mg/dL (ref 0.44–1.00)
GFR calc Af Amer: >60 mL/min (ref >60)
GFR calc non Af Amer: >60 mL/min (ref >60)
GLUCOSE: 106 mg/dL — AB (ref 65–99)
POTASSIUM: 3 mmol/L — AB (ref 3.5–5.1)
Sodium: 140 mmol/L (ref 135–145)
Total Protein: 7.4 g/dL (ref 6.5–8.1)

## 2016-01-22 MED ORDER — HYDROMORPHONE HCL 1 MG/ML IJ SOLN
0.5000 mg | Freq: Once | INTRAMUSCULAR | Status: DC
  Filled 2016-01-22: qty 1

## 2016-01-22 MED ORDER — SODIUM CHLORIDE 0.9% FLUSH: 10.0000 mL | INTRAVENOUS | Status: DC | PRN

## 2016-01-22 MED ORDER — HYDROMORPHONE HCL 1 MG/ML IJ SOLN
1.0000 mg | Freq: Once | INTRAMUSCULAR | Status: AC
  Administered 2016-01-22: 1 mg via INTRAVENOUS
  Filled 2016-01-22: qty 1

## 2016-01-22 MED ORDER — DEXTROSE-NACL 5-0.45 % IV SOLN
INTRAVENOUS | Status: DC
  Administered 2016-01-22: 23:00:00 via INTRAVENOUS

## 2016-01-22 MED ORDER — DIPHENHYDRAMINE HCL 50 MG/ML IJ SOLN
25.0000 mg | Freq: Once | INTRAMUSCULAR | Status: AC
  Administered 2016-01-22: 25 mg via INTRAVENOUS
  Filled 2016-01-22: qty 1

## 2016-01-22 MED ORDER — HYDROMORPHONE HCL 2 MG/ML IJ SOLN
2.0000 mg | Freq: Once | INTRAMUSCULAR | Status: AC
  Administered 2016-01-22: 2 mg via INTRAVENOUS
  Filled 2016-01-22: qty 1

## 2016-01-22 MED ORDER — PROMETHAZINE HCL 25 MG/ML IJ SOLN
25.0000 mg | Freq: Once | INTRAMUSCULAR | Status: AC
  Administered 2016-01-22: 25 mg via INTRAVENOUS
  Filled 2016-01-22: qty 1

## 2016-01-22 MED ORDER — POTASSIUM CHLORIDE CRYS ER 20 MEQ PO TBCR
40.0000 meq | EXTENDED_RELEASE_TABLET | Freq: Once | ORAL | Status: AC
  Administered 2016-01-22: 40 meq via ORAL
  Filled 2016-01-22: qty 2

## 2016-01-22 MED ORDER — HYDROMORPHONE HCL 1 MG/ML IJ SOLN: 1.0000 mg | Freq: Once | INTRAMUSCULAR | Status: DC

## 2016-01-22 NOTE — ED Notes (Signed)
Pt eating meal at bedside with visitor. Asked patient to give urine sample, stated she cannot give one at this time and will give one when she is able.

## 2016-01-22 NOTE — ED Provider Notes (Signed)
[facility]-EMERGENCY DEPT Provider Note   CSN: [id number] Arrival date & time: 01/22/16  1956     History   Chief Complaint Chief Complaint  Patient presents with  . Sickle Cell Pain Crisis    HPI Brittany CampbellMiranda Hickox is a 30 y.o. female with a past medical history significant for sickle cell disease with prior acute chest syndrome who presents with diffuse pain. Patient reports that for the last few days she has had worsening pain in her legs, abdomen, arms and chest. She says this feels different than her prior episode of acute chest. She denies fevers but does report chills. She reports nausea but no vomiting. She denies, patient, diarrhea, or change in urination. She reports that she has been taking her outpatient pain medications of oxycodone without relief in symptoms. She said that the pain is worsened over the last few days and acutely worsened today at around 4:30 this afternoon. She says that this is similar to prior pain crises. She denies any productive cough.  Chart review shows that patient was admitted to the day hospital yesterday for pain management. She says that the pain woke her up and is uncontrolled today.     The history is provided by the patient, a significant other and medical records. No language interpreter was used.  Sickle Cell Pain Crisis  Location:  Diffuse Severity:  Severe Onset quality:  Gradual Duration:  2 days Similar to previous crisis episodes: yes   Timing:  Constant Progression:  Worsening Chronicity:  Recurrent Context: not infection   Relieved by:  Nothing Worsened by:  Nothing Ineffective treatments:  Prescription drugs and rest Associated symptoms: chest pain and nausea   Associated symptoms: no congestion, no cough, no fatigue, no fever, no headaches, no shortness of breath, no vomiting and no wheezing   Risk factors: frequent admissions for pain, frequent pain crises and prior acute chest     Past Medical History:  Diagnosis Date  .  Anemia   . Depression, major, recurrent (HCC)   . Migraines   . Sickle cell anemia [facility](HCC)     Patient Active Problem List   Diagnosis Date Noted  . Gait abnormality   . MDD (major depressive disorder), recurrent severe, without psychosis (HCC) 01/10/2016  . Vasoocclusive sickle cell crisis (HCC) 08/21/2015  . Depression, major, recurrent (HCC)   . Family planning, Depo-Provera contraception monitoring/administration 08/08/2015  . Sickle cell anemia with crisis (HCC) 07/12/2015  . Sickle cell anemia with pain (HCC) 05/28/2015  . Intractable pain   . Chest pain 03/07/2015  . Leukocytosis 03/07/2015  . Vitamin D deficiency 01/08/2015  . Chronic pain syndrome 01/08/2015  . Sickle cell crisis (HCC) 10/22/2014  . Hypokalemia 08/21/2014  . Depression 08/09/2014  . 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 Sickle cell disease with crisis (HCC) 07/12/2014  . Hb-SS disease with crisis (HCC) 06/11/2014  . Pericardial effusion   . Anemia 02/09/2014  . Neuropathy (HCC) 02/09/2014  . Chronic headache disorder 02/09/2014  . Abnormal CT scan, chest   . Sickle cell pain crisis (HCC) 12/30/2013    Past Surgical History:  Procedure Laterality Date  . CESAREAN SECTION    . CHOLECYSTECTOMY  2000  . IR GENERIC HISTORICAL  10/08/2015   IR US GUIDE VASC ACCESS RIGHT 10/08/2015 Simonne ComeJohn Watts, MD [facility]-INTERV RAD  . IR GENERIC HISTORICAL  10/08/2015   IR FLUORO GUIDE CV LINE RIGHT 10/08/2015 Simonne ComeJohn Watts, MD [facility]-INTERV RAD  . MULTIPLE TOOTH EXTRACTIONS N/A   .  port a cath placement Right    about 6-7 years ago  . removal of porta cath Right 09/11/15  . TUBAL LIGATION      OB History    No data available       Home Medications    Prior to Admission medications   Medication Sig Start Date End Date Taking? Authorizing Provider  ARIPiprazole (ABILIFY) 5 MG tablet Take 1 tablet (5 mg total) by mouth 2 (two) times daily. 01/12/16  Yes Olugbemiga Annitta Needs, MD  DULoxetine (CYMBALTA) 60 MG capsule TAKE  1 CAPSULE BY MOUTH DAILY. 11/27/15  Yes Quentin Angst, MD  folic acid (FOLVITE) 1 MG tablet Take 1 tablet (1 mg total) by mouth daily. 01/17/16  Yes Quentin Angst, MD  hydroxyurea (HYDREA) 500 MG capsule TAKE 2 CAPSULES BY MOUTH DAILY. MAY TAKE WITH FOOD TO MINIMIZE GI SIDE EFFECTS. 08/08/15  Yes [name], FNP  ibuprofen (ADVIL,MOTRIN) 200 MG tablet Take 400 mg by mouth every 6 (six) hours as needed for moderate pain.   Yes Historical Provider, MD  morphine (MS CONTIN) 30 MG 12 hr tablet Take 1 tablet (30 mg total) by mouth every 12 (twelve) hours. 12/31/15  Yes Quentin Angst, MD  oxyCODONE-acetaminophen (PERCOCET) 10-325 MG tablet Take 1 tablet by mouth every 4 (four) hours as needed for pain. 01/15/16 01/30/16 Yes Quentin Angst, MD  Topiramate ER 100 MG CP24 Take 100 mg by mouth at bedtime.   Yes Historical Provider, MD  gabapentin (NEURONTIN) 300 MG capsule TAKE 1 CAPSULE BY MOUTH 3 TIMES DAILY. Patient not taking: Reported on 01/15/2016 11/27/15   Quentin Angst, MD  potassium chloride SA (K-DUR,KLOR-CON) 20 MEQ tablet Take 2 tablets (40 mEq total) by mouth daily. Patient not taking: Reported on 01/15/2016 11/23/15   Altha Harm, MD    Family History Family History  Problem Relation Age of Onset  . Sickle cell trait Father   . Sickle cell trait Mother   . Sickle cell anemia Other     Social History Social History  Substance Use Topics  . Smoking status: Former Games developer  . Smokeless tobacco: Never Used  . Alcohol use No     Allergies   Ultram [tramadol]; Zofran [ondansetron hcl]; Buprenorphine hcl; Morphine and related; and Tape   Review of Systems Review of Systems  Constitutional: Positive for chills. Negative for diaphoresis, fatigue and fever.  HENT: Negative for congestion.   Eyes: Negative for visual disturbance.  Respiratory: Negative for cough, chest tightness, shortness of breath, wheezing and stridor.   Cardiovascular:  Positive for chest pain. Negative for palpitations.  Gastrointestinal: Positive for abdominal pain and nausea. Negative for constipation, diarrhea and vomiting.  Genitourinary: Negative for dysuria.  Musculoskeletal: Positive for back pain. Negative for neck pain and neck stiffness.  Neurological: Negative for headaches.  Psychiatric/Behavioral: Negative for agitation and confusion.  All other systems reviewed and are negative.    Physical Exam Updated Vital Signs BP 116/72 (BP Location: Left Arm)   Pulse 97   Temp 98.2 F (36.8 C) (Oral)   Resp 18   Ht 5' (1.524 m)   Wt 127 lb (57.6 kg)   LMP  (LMP Unknown) Comment: no period every month due to injections  SpO2 98%   BMI 24.80 kg/m   Physical Exam  Constitutional: She is oriented to person, place, and time. She appears well-developed and well-nourished. No distress.  HENT:  Head: Normocephalic and atraumatic.  Mouth/Throat: Oropharynx is  clear and moist. No oropharyngeal exudate.  Eyes: Conjunctivae and EOM are normal. Pupils are equal, round, and reactive to light.  Neck: Normal range of motion. Neck supple.  Cardiovascular: Normal rate, regular rhythm and intact distal pulses.   No murmur heard. Pulmonary/Chest: Effort normal and breath sounds normal. No stridor. No respiratory distress. She has no wheezes. She has no rales. She exhibits no tenderness.  Abdominal: Soft. There is no tenderness.  Musculoskeletal: She exhibits tenderness (in extremitities and back). She exhibits no edema.  Neurological: She is alert and oriented to person, place, and time. She displays normal reflexes. No cranial nerve deficit or sensory deficit. She exhibits normal muscle tone. Coordination normal.  Skin: Skin is warm and dry. Capillary refill takes less than 2 seconds. No rash noted. No erythema.  Psychiatric: She has a normal mood and affect.  Nursing note and vitals reviewed.    ED Treatments / Results  Labs (all labs ordered are  listed, but only abnormal results are displayed) Labs Reviewed  COMPREHENSIVE METABOLIC PANEL - Abnormal; Notable for the following:       Result Value   Potassium 3.0 (*)    Glucose, Bld 106 (*)    Calcium 8.6 (*)    Total Bilirubin 2.1 (*)    All other components within normal limits  CBC WITH DIFFERENTIAL/PLATELET - Abnormal; Notable for the following:    WBC 11.1 (*)    RBC 2.35 (*)    Hemoglobin 8.6 (*)    HCT 24.1 (*)    MCV 102.6 (*)    MCH 36.6 (*)    RDW 21.7 (*)    Monocytes Absolute 1.1 (*)    All other components within normal limits  RETICULOCYTES - Abnormal; Notable for the following:    Retic Ct Pct 14.0 (*)    RBC. 2.35 (*)    Retic Count, Manual 329.0 (*)    All other components within normal limits  POC URINE PREG, ED    EKG  EKG Interpretation None       Radiology Dg Chest 2 View  Result Date: 01/22/2016 CLINICAL DATA:  Shortness of breath. Arms, legs, and back hurt. History of sickle cell. Previous smoker. EXAM: CHEST  2 VIEW COMPARISON:  01/05/2016 FINDINGS: Right central venous catheter with tip over the low SVC region. No pneumothorax. Normal heart size and pulmonary vascularity. No focal airspace disease or consolidation. No blunting of costophrenic angles. No pneumothorax. Surgical clips in the right upper quadrant. Old catheter fragment demonstrated in the upper mediastinum. This was present on previous CT from 10/19/2015. IMPRESSION: No active cardiopulmonary disease. Electronically Signed   By: Burman Nieves M.D.   On: 01/22/2016 22:27    Procedures Procedures (including critical care time)  Medications Ordered in ED Medications  HYDROmorphone (DILAUDID) injection 0.5 mg (0 mg Subcutaneous Hold 01/22/16 2120)  sodium chloride flush (NS) 0.9 % injection 10-40 mL (not administered)     Initial Impression / Assessment and Plan / ED Course  I have reviewed the triage vital signs and the nursing notes.  Pertinent labs & imaging results  that were available during my care of the patient were reviewed by me and considered in my medical decision making (see chart for details).  Clinical Course     Brittany Foster is a 30 y.o. female with a past medical history significant for sickle cell disease with prior acute chest syndrome who presents with diffuse pain.  History and exam are seen above.  On  exam, patient has diffuse tenderness in her extremities. She has no tenderness in her chest. She has no abdominal tenderness. No back tenderness. Neurologic exam is unremarkable. Lungs are clear.   Patient had laboratory testing ordered, fluids, and pain medications ordered. Lab testing revealed improved quite blood cell count from prior. Anemia is similar to prior. Potassium is low. Oral potassium supplementation ordered. Reticulocyte count is 14.  Chest x-ray showed no acute cardiopulmonary abnormality specifically, no pneumonia.  Patient given several doses of IV pain medications without significant relief. Patient is still and 9 out of 10 pain after 3 doses of Dilaudid. Due to continued uncontrolled pain, admission team will be called for further management of sickle cell pain crisis. Do not feel patient has acute infection or acute chest syndrome causing her symptoms. Patient is still awaiting urinalysis however she denies any changes in her urine.   Patient admitted to hospitalist service for further management due to uncontrolled pain.   Final Clinical Impressions(s) / ED Diagnoses   Final diagnoses:  Sickle cell pain crisis (HCC)    Clinical Impression: 1. Sickle cell pain crisis [facility](HCC)     Disposition: Admit to Hospitalist service    Heide Scaleshristopher J Shaya Altamura, MD 01/24/16 1101

## 2016-01-22 NOTE — ED Triage Notes (Signed)
Patient complaining that her arms, legs, and back hurting. Patient states she took her pain medicine without any relief. Patient says symptoms started around 17:19.

## 2016-01-22 NOTE — ED Notes (Signed)
Called patient to room no answer

## 2016-01-22 NOTE — ED Notes (Signed)
Provider in room  

## 2016-01-23 DIAGNOSIS — F1120 Opioid dependence, uncomplicated: Secondary | ICD-10-CM | POA: Diagnosis present

## 2016-01-23 DIAGNOSIS — Z79899 Other long term (current) drug therapy: Secondary | ICD-10-CM | POA: Diagnosis not present

## 2016-01-23 DIAGNOSIS — G629 Polyneuropathy, unspecified: Secondary | ICD-10-CM

## 2016-01-23 DIAGNOSIS — Z9049 Acquired absence of other specified parts of digestive tract: Secondary | ICD-10-CM | POA: Diagnosis not present

## 2016-01-23 DIAGNOSIS — I959 Hypotension, unspecified: Secondary | ICD-10-CM | POA: Diagnosis present

## 2016-01-23 DIAGNOSIS — R079 Chest pain, unspecified: Secondary | ICD-10-CM | POA: Diagnosis present

## 2016-01-23 DIAGNOSIS — E876 Hypokalemia: Secondary | ICD-10-CM | POA: Diagnosis present

## 2016-01-23 DIAGNOSIS — Z9114 Patient's other noncompliance with medication regimen: Secondary | ICD-10-CM | POA: Diagnosis not present

## 2016-01-23 DIAGNOSIS — F332 Major depressive disorder, recurrent severe without psychotic features: Secondary | ICD-10-CM | POA: Diagnosis present

## 2016-01-23 DIAGNOSIS — Z87891 Personal history of nicotine dependence: Secondary | ICD-10-CM | POA: Diagnosis not present

## 2016-01-23 DIAGNOSIS — F339 Major depressive disorder, recurrent, unspecified: Secondary | ICD-10-CM

## 2016-01-23 DIAGNOSIS — D5700 Hb-SS disease with crisis, unspecified: Secondary | ICD-10-CM | POA: Diagnosis present

## 2016-01-23 DIAGNOSIS — F418 Other specified anxiety disorders: Secondary | ICD-10-CM | POA: Diagnosis present

## 2016-01-23 DIAGNOSIS — G894 Chronic pain syndrome: Secondary | ICD-10-CM | POA: Diagnosis present

## 2016-01-23 DIAGNOSIS — R52 Pain, unspecified: Secondary | ICD-10-CM

## 2016-01-23 MED ORDER — HYDROXYUREA 500 MG PO CAPS
1000.0000 mg | ORAL_CAPSULE | Freq: Every day | ORAL | Status: DC
  Administered 2016-01-23 – 2016-01-26 (×4): 1000 mg via ORAL
  Filled 2016-01-23 (×5): qty 2

## 2016-01-23 MED ORDER — SODIUM CHLORIDE 0.9% FLUSH: 9.0000 mL | INTRAVENOUS | Status: DC | PRN

## 2016-01-23 MED ORDER — ALUM & MAG HYDROXIDE-SIMETH 200-200-20 MG/5ML PO SUSP: 15.0000 mL | ORAL | Status: DC | PRN

## 2016-01-23 MED ORDER — PROMETHAZINE HCL 25 MG PO TABS
12.5000 mg | ORAL_TABLET | ORAL | Status: DC | PRN
  Administered 2016-01-23 – 2016-01-28 (×2): 25 mg via ORAL
  Filled 2016-01-23 (×2): qty 1

## 2016-01-23 MED ORDER — ONDANSETRON HCL 4 MG/2ML IJ SOLN: 4.0000 mg | Freq: Four times a day (QID) | INTRAMUSCULAR | Status: DC | PRN

## 2016-01-23 MED ORDER — POTASSIUM CHLORIDE IN NACL 40-0.9 MEQ/L-% IV SOLN
INTRAVENOUS | Status: DC
  Administered 2016-01-23 – 2016-01-27 (×6): 75 mL/h via INTRAVENOUS
  Filled 2016-01-23 (×10): qty 1000

## 2016-01-23 MED ORDER — KETOROLAC TROMETHAMINE 30 MG/ML IJ SOLN
INTRAMUSCULAR | Status: AC
  Filled 2016-01-23: qty 1

## 2016-01-23 MED ORDER — DULOXETINE HCL 60 MG PO CPEP
60.0000 mg | ORAL_CAPSULE | Freq: Every day | ORAL | Status: DC
  Administered 2016-01-23 – 2016-01-26 (×4): 60 mg via ORAL
  Filled 2016-01-23 (×5): qty 1

## 2016-01-23 MED ORDER — POTASSIUM CHLORIDE CRYS ER 20 MEQ PO TBCR
40.0000 meq | EXTENDED_RELEASE_TABLET | Freq: Every day | ORAL | Status: DC
  Administered 2016-01-23 – 2016-01-26 (×4): 40 meq via ORAL
  Filled 2016-01-23 (×5): qty 2

## 2016-01-23 MED ORDER — ENOXAPARIN SODIUM 40 MG/0.4ML ~~LOC~~ SOLN
40.0000 mg | SUBCUTANEOUS | Status: DC
  Filled 2016-01-23 (×3): qty 0.4

## 2016-01-23 MED ORDER — DIPHENHYDRAMINE HCL 25 MG PO CAPS
25.0000 mg | ORAL_CAPSULE | ORAL | Status: DC | PRN
  Filled 2016-01-23 (×2): qty 2

## 2016-01-23 MED ORDER — SODIUM CHLORIDE 0.9 % IV SOLN
25.0000 mg | INTRAVENOUS | Status: DC | PRN
  Administered 2016-01-23 – 2016-01-25 (×12): 25 mg via INTRAVENOUS
  Filled 2016-01-23 (×26): qty 0.5

## 2016-01-23 MED ORDER — ARIPIPRAZOLE 5 MG PO TABS
5.0000 mg | ORAL_TABLET | Freq: Two times a day (BID) | ORAL | Status: DC
  Administered 2016-01-23 – 2016-01-27 (×9): 5 mg via ORAL
  Filled 2016-01-23 (×12): qty 1

## 2016-01-23 MED ORDER — HYDROMORPHONE HCL 2 MG/ML IJ SOLN
2.0000 mg | Freq: Once | INTRAMUSCULAR | Status: AC
  Administered 2016-01-23: 2 mg via INTRAVENOUS
  Filled 2016-01-23: qty 1

## 2016-01-23 MED ORDER — HYDROMORPHONE 1 MG/ML IV SOLN: INTRAVENOUS | Status: DC

## 2016-01-23 MED ORDER — FOLIC ACID 1 MG PO TABS
1.0000 mg | ORAL_TABLET | Freq: Every day | ORAL | Status: DC
  Administered 2016-01-23 – 2016-01-26 (×4): 1 mg via ORAL
  Filled 2016-01-23 (×5): qty 1

## 2016-01-23 MED ORDER — NALOXONE HCL 0.4 MG/ML IJ SOLN: 0.4000 mg | INTRAMUSCULAR | Status: DC | PRN

## 2016-01-23 MED ORDER — HYDROXYZINE HCL 25 MG PO TABS: 25.0000 mg | ORAL_TABLET | ORAL | Status: DC | PRN

## 2016-01-23 MED ORDER — POLYETHYLENE GLYCOL 3350 17 G PO PACK: 17.0000 g | PACK | Freq: Every day | ORAL | Status: DC | PRN

## 2016-01-23 MED ORDER — SENNOSIDES-DOCUSATE SODIUM 8.6-50 MG PO TABS
1.0000 | ORAL_TABLET | Freq: Two times a day (BID) | ORAL | Status: DC
  Filled 2016-01-23 (×6): qty 1

## 2016-01-23 MED ORDER — KETOROLAC TROMETHAMINE 30 MG/ML IJ SOLN
30.0000 mg | Freq: Four times a day (QID) | INTRAMUSCULAR | Status: AC
  Administered 2016-01-23 – 2016-01-27 (×17): 30 mg via INTRAVENOUS
  Filled 2016-01-23 (×16): qty 1

## 2016-01-23 MED ORDER — HYDROMORPHONE 1 MG/ML IV SOLN
INTRAVENOUS | Status: DC
  Administered 2016-01-23: 25 mg via INTRAVENOUS
  Filled 2016-01-23: qty 25

## 2016-01-23 MED ORDER — TOPIRAMATE 25 MG PO TABS
50.0000 mg | ORAL_TABLET | Freq: Two times a day (BID) | ORAL | Status: DC
  Administered 2016-01-23 – 2016-01-26 (×9): 50 mg via ORAL
  Filled 2016-01-23 (×10): qty 2

## 2016-01-23 MED ORDER — PROMETHAZINE HCL 25 MG RE SUPP: 12.5000 mg | RECTAL | Status: DC | PRN

## 2016-01-23 MED ORDER — DIPHENHYDRAMINE HCL 50 MG/ML IJ SOLN
12.5000 mg | Freq: Once | INTRAMUSCULAR | Status: AC
  Administered 2016-01-23: 12.5 mg via INTRAVENOUS
  Filled 2016-01-23: qty 1

## 2016-01-23 MED ORDER — HYDROMORPHONE 1 MG/ML IV SOLN
INTRAVENOUS | Status: DC
  Administered 2016-01-23: 25 mg via INTRAVENOUS
  Administered 2016-01-23: 20:00:00 via INTRAVENOUS
  Administered 2016-01-23: 12 mg via INTRAVENOUS
  Administered 2016-01-23: 10.51 mg via INTRAVENOUS
  Administered 2016-01-24: 14.1 mg via INTRAVENOUS
  Administered 2016-01-24: 1 mg via INTRAVENOUS
  Administered 2016-01-24: 7.6 mg via INTRAVENOUS
  Administered 2016-01-24: 8.6 mg via INTRAVENOUS
  Administered 2016-01-24: 8.7 mg via INTRAVENOUS
  Administered 2016-01-24: 1 mg via INTRAVENOUS
  Administered 2016-01-24: 23.8 mg via INTRAVENOUS
  Administered 2016-01-24 (×2): 1 mg via INTRAVENOUS
  Administered 2016-01-24: 7.6 mg via INTRAVENOUS
  Administered 2016-01-25 (×2): 1 mg via INTRAVENOUS
  Administered 2016-01-25: 12.6 mg via INTRAVENOUS
  Administered 2016-01-25: 1 mg via INTRAVENOUS
  Administered 2016-01-25: 18:00:00 via INTRAVENOUS
  Administered 2016-01-25 (×3): 1 mg via INTRAVENOUS
  Administered 2016-01-25: 13 mg via INTRAVENOUS
  Administered 2016-01-25 (×3): 1 mg via INTRAVENOUS
  Administered 2016-01-25: 09:00:00 via INTRAVENOUS
  Administered 2016-01-25: 1 mg via INTRAVENOUS
  Administered 2016-01-25: 25 mg via INTRAVENOUS
  Administered 2016-01-25: 1 mg via INTRAVENOUS
  Administered 2016-01-25: 7.9 mg via INTRAVENOUS
  Administered 2016-01-25: 10.8 mg via INTRAVENOUS
  Administered 2016-01-26: 25 mg via INTRAVENOUS
  Administered 2016-01-26: 11.69 mg via INTRAVENOUS
  Administered 2016-01-26: 1 mg via INTRAVENOUS
  Administered 2016-01-26: 10.8 mg via INTRAVENOUS
  Administered 2016-01-26: 1 mg via INTRAVENOUS
  Administered 2016-01-26: 5.3 mg via INTRAVENOUS
  Administered 2016-01-26: 1 mg via INTRAVENOUS
  Filled 2016-01-23 (×8): qty 25

## 2016-01-23 NOTE — H&P (Signed)
History and Physical:    Brittany CampbellMiranda Foster   RUE:[id number] DOB: 09/18/1985 DOA: 01/22/2016  Referring MD/provider: Canary Brimhristopher J Tegeler, MD PCP: Jeanann LewandowskyJEGEDE, OLUGBEMIGA, MD   Patient coming from: Home  Chief Complaint: Sickle cell pain crisis.  History of Present Illness:   Brittany CampbellMiranda Foster is an 30 y.o. female with medical history significant of sickle cell anemia (SS disease), multiple hospitalizations (24 hospital admissions in the past 6 months), major depressive disorder, and migraines who [facility] 9/10 generalized pain on 01/21/16. She was treated with IV fluids, IV Toradol and PCA Dilaudid which brought her pain down to a 6/10. She was discharged home where, she says, the pain didn't get any better. She has been using OxyContin, 30 mg twice a day and oxycodone 10 mg every 4 hours without relief. She says that her pain is "all over". There is some associated nausea, but no vomiting or other symptoms.  ED Course:  The patient was given multiple doses of Dilaudid and Benadryl with no relief of her pain.  ROS:   Review of Systems  Constitutional: Negative for chills and fever.  HENT: Negative.   Eyes: Negative.   Respiratory: Negative.   Cardiovascular: Positive for chest pain.  Gastrointestinal: Positive for nausea.  Genitourinary: Negative.   Musculoskeletal: Positive for back pain, joint pain and myalgias.  Skin: Negative.   Neurological: Negative.   Endo/Heme/Allergies: Negative.   Psychiatric/Behavioral: Positive for depression.     Past Medical History:   Past Medical History:  Diagnosis Date  . Anemia   . Depression, major, recurrent (HCC)   . Migraines   . Sickle cell anemia (HCC)     Past Surgical History:   Past Surgical History:  Procedure Laterality Date  . CESAREAN SECTION    . CHOLECYSTECTOMY  2000  . IR GENERIC HISTORICAL  10/08/2015   IR US GUIDE VASC ACCESS RIGHT 10/08/2015 Simonne ComeJohn Watts, MD [facility]  . IR GENERIC  HISTORICAL  10/08/2015   IR FLUORO GUIDE CV LINE RIGHT 10/08/2015 Simonne ComeJohn Watts, MD [facility]  . MULTIPLE TOOTH EXTRACTIONS N/A   . port a cath placement Right    about 6-7 years ago  . removal of porta cath Right 09/11/15  . TUBAL LIGATION      Social History:   Social History   Social History  . Marital status: Single    Spouse name: N/A  . Number of children: N/A  . Years of education: N/A   Occupational History  . Not on file.   Social History Main Topics  . Smoking status: Former Games developermoker  . Smokeless tobacco: Never Used  . Alcohol use No  . Drug use: No  . Sexual activity: Yes    Partners: Male    Birth control/ protection: Injection     Comment: 1 new partner recently, no concern about STI, uses condoms   Other Topics Concern  . Not on file   Social History Narrative  . No narrative on file    Allergies   Ultram [tramadol]; Zofran [ondansetron hcl]; Buprenorphine hcl; Morphine and related; and Tape  Family history:   Family History  Problem Relation Age of Onset  . Sickle cell trait Father   . Sickle cell trait Mother   . Sickle cell anemia Other     Current Medications:   Prior to Admission medications   Medication Sig Start Date End Date Taking? Authorizing Provider  ARIPiprazole (ABILIFY) 5 MG tablet Take  1 tablet (5 mg total) by mouth 2 (two) times daily. 01/12/16  Yes Olugbemiga Annitta NeedsE Jegede, MD  DULoxetine (CYMBALTA) 60 MG capsule TAKE 1 CAPSULE BY MOUTH DAILY. 11/27/15  Yes Quentin Angstlugbemiga E Jegede, MD  folic acid (FOLVITE) 1 MG tablet Take 1 tablet (1 mg total) by mouth daily. 01/17/16  Yes Quentin Angstlugbemiga E Jegede, MD  hydroxyurea (HYDREA) 500 MG capsule TAKE 2 CAPSULES BY MOUTH DAILY. MAY TAKE WITH FOOD TO MINIMIZE GI SIDE EFFECTS. 08/08/15  Yes Massie MaroonLachina M [name], FNP  ibuprofen (ADVIL,MOTRIN) 200 MG tablet Take 400 mg by mouth every 6 (six) hours as needed for moderate pain.   Yes Historical Provider, MD  morphine (MS CONTIN) 30 MG 12 hr tablet Take 1 tablet  (30 mg total) by mouth every 12 (twelve) hours. 12/31/15  Yes Quentin Angstlugbemiga E Jegede, MD  oxyCODONE-acetaminophen (PERCOCET) 10-325 MG tablet Take 1 tablet by mouth every 4 (four) hours as needed for pain. 01/15/16 01/30/16 Yes Quentin Angstlugbemiga E Jegede, MD  Topiramate ER 100 MG CP24 Take 100 mg by mouth at bedtime.   Yes Historical Provider, MD  gabapentin (NEURONTIN) 300 MG capsule TAKE 1 CAPSULE BY MOUTH 3 TIMES DAILY. Patient not taking: Reported on 01/15/2016 11/27/15   Quentin Angstlugbemiga E Jegede, MD  potassium chloride SA (K-DUR,KLOR-CON) 20 MEQ tablet Take 2 tablets (40 mEq total) by mouth daily. Patient not taking: Reported on 01/15/2016 11/23/15   Altha HarmMichelle A [name], MD    Physical Exam:   Vitals:   01/22/16 2330 01/22/16 2341 01/23/16 0000 01/23/16 0030  BP: 108/68 108/68 100/56 103/62  Pulse: 98 101 96 95  Resp:  18 18   Temp:      TempSrc:      SpO2: 97% 95% 96% 95%  Weight:      Height:         Physical Exam: Blood pressure 103/62, pulse 95, temperature 97.6 F (36.4 C), temperature source Oral, resp. rate 18, height 5' (1.524 m), weight 57.6 kg (127 lb), SpO2 95 %. Gen: Appears uncomfortable. Head: Normocephalic, atraumatic. Eyes: Pupils equal, round and reactive to light. Extraocular movements intact.  Sclerae nonicteric. No lid lag. Mouth: Oropharynx reveals moist mucous membranes. Dentition is poor. Neck: Supple, no thyromegaly, no lymphadenopathy, no jugular venous distention. Chest: Lungs are diminished to auscultation with good air movement. No rales, rhonchi or wheezes. PICC line anterior right chest. No signs of infection. CV: Heart sounds are regular with an S1, S2. 3/6 systolic murmur with hyperdynamic precordium. Abdomen: Soft, nontender, nondistended with normal active bowel sounds. No hepatosplenomegaly or palpable masses. Extremities: Extremities are without clubbing, edema, or cyanosis. Pedal pulses 2+.  Skin: Warm and dry. No rashes, lesions or wounds. Neuro: Alert  and oriented times 3; grossly nonfocal.  Psych: Insight is good and judgment is appropriate. Mood and affect flat.   Data Review:    Labs: Basic Metabolic Panel:  Recent Labs Lab 01/22/16 2034  NA 140  K 3.0*  CL 106  CO2 27  GLUCOSE 106*  BUN 6  CREATININE 0.47  CALCIUM 8.6*   Liver Function Tests:  Recent Labs Lab 01/22/16 2034  AST 37  ALT 19  ALKPHOS 78  BILITOT 2.1*  PROT 7.4  ALBUMIN 4.1   No results for input(s): LIPASE, AMYLASE in the last 168 hours. No results for input(s): AMMONIA in the last 168 hours. CBC:  Recent Labs Lab 01/21/16 0839 01/22/16 2034  WBC 12.5* 11.1*  NEUTROABS 7.1 6.2  HGB 8.4* 8.6*  HCT 23.9*  24.1*  MCV 102.6* 102.6*  PLT 355 313   Cardiac Enzymes: No results for input(s): CKTOTAL, CKMB, CKMBINDEX, TROPONINI in the last 168 hours.  BNP (last 3 results) No results for input(s): PROBNP in the last 8760 hours. CBG: No results for input(s): GLUCAP in the last 168 hours.  Urinalysis    Component Value Date/Time   COLORURINE AMBER (A) 01/15/2016 1026   APPEARANCEUR CLOUDY (A) 01/15/2016 1026   LABSPEC 1.016 01/15/2016 1026   PHURINE 6.0 01/15/2016 1026   GLUCOSEU NEGATIVE 01/15/2016 1026   HGBUR NEGATIVE 01/15/2016 1026   BILIRUBINUR NEGATIVE 01/15/2016 1026   KETONESUR NEGATIVE 01/15/2016 1026   PROTEINUR NEGATIVE 01/15/2016 1026   UROBILINOGEN 1.0 11/13/2014 1338   NITRITE NEGATIVE 01/15/2016 1026   LEUKOCYTESUR SMALL (A) 01/15/2016 1026      Radiographic Studies: Dg Chest 2 View  Result Date: 01/22/2016 CLINICAL DATA:  Shortness of breath. Arms, legs, and back hurt. History of sickle cell. Previous smoker. EXAM: CHEST  2 VIEW COMPARISON:  01/05/2016 FINDINGS: Right central venous catheter with tip over the low SVC region. No pneumothorax. Normal heart size and pulmonary vascularity. No focal airspace disease or consolidation. No blunting of costophrenic angles. No pneumothorax. Surgical clips in the right  upper quadrant. Old catheter fragment demonstrated in the upper mediastinum. This was present on previous CT from 10/19/2015. IMPRESSION: No active cardiopulmonary disease. Electronically Signed   By: Burman Nieves M.D.   On: 01/22/2016 22:27    EKG: None done.   Assessment/Plan:   Principal Problem:   Hb-SS disease with crisis (HCC)/intractable pain/chronic pain syndrome/opiate dependency Sickle cell order set utilized. Pain control with PCA Dilaudid for opiate tolerant patients. Toradol 30 mg IV every 6 hours ordered as well. Continue folic acid and Hydrea. Hemoglobin stable at 8.6 mg/dL. Reticulocyte count elevated.  Active Problems:   Neuropathy (HCC) Patient has not been taking Neurontin.    Hypokalemia We'll order IV fluids with potassium and resume her usual oral supplementation.    Chest pain No infiltrates on chest radiography, lungs clear, monitor for acute chest syndrome but no evidence of this at present.    Depression, major, recurrent (HCC) Continue Cymbalta.  Other information:   DVT prophylaxis: Lovenox ordered. Code Status: Full code. Family Communication: Female visitor at the bedside in room.  Disposition Plan: Home when stable. Consults called: None. Admission status: Observation.  The medical decision making on this patient was of high complexity and the patient is at high risk for clinical deterioration, therefore this is a level 3 visit.    RAMA,CHRISTINA [facility] Pager [phone] Cell: [phone]   If 7PM-7AM, please contact night-coverage [email or web address] Password [email or web address] 01/23/2016, 12:50 AM

## 2016-01-23 NOTE — Progress Notes (Signed)
Nursing Note: Pt pca dose changed and pt given 2 mg bolus.wbb

## 2016-01-23 NOTE — Progress Notes (Signed)
Nursing Note: Pt arrived,awake,alert,oriented x4.Pt has call bell in reach.0315 T-98.8 P-89 R-18 Bp-107/68 PO2 94% on r/a.Pt has IVF infusing and will set up PCA infusion.wbb

## 2016-01-23 NOTE — Progress Notes (Signed)
Pt ambulating to the bathroom, tolerates well.

## 2016-01-24 DIAGNOSIS — D5700 Hb-SS disease with crisis, unspecified: Principal | ICD-10-CM

## 2016-01-24 LAB — CBC WITH DIFFERENTIAL/PLATELET
BASOS PCT: 0 %
Basophils Absolute: 0 10*3/uL (ref 0.0–0.1)
EOS PCT: 4 %
Eosinophils Absolute: 0.4 10*3/uL (ref 0.0–0.7)
HCT: 22.3 % — ABNORMAL LOW (ref 36.0–46.0)
Hemoglobin: 7.8 g/dL — ABNORMAL LOW (ref 12.0–15.0)
LYMPHS ABS: 3.9 10*3/uL (ref 0.7–4.0)
Lymphocytes Relative: 36 %
MCH: 36.4 pg — AB (ref 26.0–34.0)
MCHC: 35 g/dL (ref 30.0–36.0)
MCV: 104.2 fL — AB (ref 78.0–100.0)
MONO ABS: 1 10*3/uL (ref 0.1–1.0)
Monocytes Relative: 9 %
NEUTROS ABS: 5.4 10*3/uL (ref 1.7–7.7)
Neutrophils Relative %: 51 %
PLATELETS: 258 10*3/uL (ref 150–400)
RBC: 2.14 MIL/uL — ABNORMAL LOW (ref 3.87–5.11)
RDW: 21.7 % — AB (ref 11.5–15.5)
WBC: 10.7 10*3/uL — ABNORMAL HIGH (ref 4.0–10.5)

## 2016-01-24 LAB — COMPREHENSIVE METABOLIC PANEL
ALT: 20 U/L (ref 14–54)
AST: 37 U/L (ref 15–41)
Albumin: 3.5 g/dL (ref 3.5–5.0)
Alkaline Phosphatase: 75 U/L (ref 38–126)
Anion gap: 4 — ABNORMAL LOW (ref 5–15)
BUN: <5 mg/dL — ABNORMAL LOW (ref 6–20)
CHLORIDE: 114 mmol/L — AB (ref 101–111)
CO2: 23 mmol/L (ref 22–32)
CREATININE: 0.41 mg/dL — AB (ref 0.44–1.00)
Calcium: 8.6 mg/dL — ABNORMAL LOW (ref 8.9–10.3)
Glucose, Bld: 108 mg/dL — ABNORMAL HIGH (ref 65–99)
POTASSIUM: 4.5 mmol/L (ref 3.5–5.1)
Sodium: 141 mmol/L (ref 135–145)
Total Bilirubin: 1.7 mg/dL — ABNORMAL HIGH (ref 0.3–1.2)
Total Protein: 6.3 g/dL — ABNORMAL LOW (ref 6.5–8.1)

## 2016-01-24 MED ORDER — GABAPENTIN 300 MG PO CAPS
300.0000 mg | ORAL_CAPSULE | Freq: Three times a day (TID) | ORAL | Status: DC
  Administered 2016-01-26: 300 mg via ORAL
  Filled 2016-01-24 (×6): qty 1

## 2016-01-24 MED ORDER — MORPHINE SULFATE ER 30 MG PO TBCR
30.0000 mg | EXTENDED_RELEASE_TABLET | Freq: Two times a day (BID) | ORAL | Status: DC
  Administered 2016-01-24 – 2016-01-26 (×5): 30 mg via ORAL
  Filled 2016-01-24 (×6): qty 1

## 2016-01-24 NOTE — Progress Notes (Signed)
Subjective: A 30 year old female admitted last nightwith sickle cell painful crisis. Patient is a frequent flyer was chronic pain.she is complaining of 8 out of 8 pain.currently not on her long-acting.she is on Dilaudid PCAwith Toradol.she has used 60 mg with 66 demands on  47 deliveries so far. No nausea vomit or diarrhea.  Objective: Vital signs in last 24 hours: Temp:  [97.7 F (36.5 C)-98.7 F (37.1 C)] 98.7 F (37.1 C) (11/24 0600) Pulse Rate:  [81-93] 89 (11/24 0600) Resp:  [12-20] 18 (11/24 0600) BP: (88-98)/(52-67) 92/61 (11/24 0600) SpO2:  [15 %-99 %] 95 % (11/24 0600) FiO2 (%):  [90 %] 90 % (11/23 1130) Weight change:  Last BM Date: 01/22/16  Intake/Output from previous day: 11/23 0701 - 11/24 0700 In: 2823.1 [P.O.:440; I.V.:2083.1; IV Piggyback:300] Out: 1440 [Urine:1440] Intake/Output this shift: No intake/output data recorded.  General appearance: alert, cooperative and no distress Head: Normocephalic, without obvious abnormality, atraumatic Back: symmetric, no curvature. ROM normal. No CVA tenderness. Resp: clear to auscultation bilaterally Chest wall: no tenderness Cardio: regular rate and rhythm, S1, S2 normal, no murmur, click, rub or gallop GI: soft, non-tender; bowel sounds normal; no masses,  no organomegaly Extremities: extremities normal, atraumatic, no cyanosis or edema Pulses: 2+ and symmetric Skin: Skin color, texture, turgor normal. No rashes or lesions Neurologic: Grossly normal  Lab Results:  Recent Labs  01/21/16 0839 01/22/16 2034  WBC 12.5* 11.1*  HGB 8.4* 8.6*  HCT 23.9* 24.1*  PLT 355 313   BMET  Recent Labs  01/22/16 2034  NA 140  K 3.0*  CL 106  CO2 27  GLUCOSE 106*  BUN 6  CREATININE 0.47  CALCIUM 8.6*    Studies/Results: Dg Chest 2 View  Result Date: 01/22/2016 CLINICAL DATA:  Shortness of breath. Arms, legs, and back hurt. History of sickle cell. Previous smoker. EXAM: CHEST  2 VIEW COMPARISON:  01/05/2016  FINDINGS: Right central venous catheter with tip over the low SVC region. No pneumothorax. Normal heart size and pulmonary vascularity. No focal airspace disease or consolidation. No blunting of costophrenic angles. No pneumothorax. Surgical clips in the right upper quadrant. Old catheter fragment demonstrated in the upper mediastinum. This was present on previous CT from 10/19/2015. IMPRESSION: No active cardiopulmonary disease. Electronically Signed   By: Burman NievesWilliam  Stevens M.D.   On: 01/22/2016 22:27    Medications: I have reviewed the patient's current medications.  Assessment/Plan: A 30 year old female admitted with sickle cell painful crisis.  #1 sickle cell painful crisis:patient will be maintained on current Dilaudid PCA dose.I will at home MS Contin. Continue with Toradol. Continue IV hydration. Reassess pain in the morning.  #2 sickle cell anemia:hemoglobin appears to be at her baseline.continue to monitor.  #3chronic pain syndrome: Continue home medications and counseling provided.  #4 hypokalemia:this has been repleted  #5 neuropathy:continue Neurontin.  #6 depression with anxiety:ontinue home medications    LOS: 1 day   Miela Desjardin,LAWAL 01/24/2016, 7:08 AM

## 2016-01-25 LAB — CBC WITH DIFFERENTIAL/PLATELET
BASOS ABS: 0.1 10*3/uL (ref 0.0–0.1)
Basophils Relative: 1 %
Eosinophils Absolute: 0.5 10*3/uL (ref 0.0–0.7)
Eosinophils Relative: 4 %
HEMATOCRIT: 22.9 % — AB (ref 36.0–46.0)
Hemoglobin: 7.8 g/dL — ABNORMAL LOW (ref 12.0–15.0)
LYMPHS PCT: 33 %
Lymphs Abs: 3.5 10*3/uL (ref 0.7–4.0)
MCH: 35.8 pg — ABNORMAL HIGH (ref 26.0–34.0)
MCHC: 34.1 g/dL (ref 30.0–36.0)
MCV: 105 fL — AB (ref 78.0–100.0)
MONO ABS: 0.8 10*3/uL (ref 0.1–1.0)
Monocytes Relative: 8 %
NEUTROS ABS: 5.9 10*3/uL (ref 1.7–7.7)
Neutrophils Relative %: 55 %
Platelets: 255 10*3/uL (ref 150–400)
RBC: 2.18 MIL/uL — AB (ref 3.87–5.11)
RDW: 21.4 % — AB (ref 11.5–15.5)
WBC: 10.7 10*3/uL — ABNORMAL HIGH (ref 4.0–10.5)

## 2016-01-25 LAB — COMPREHENSIVE METABOLIC PANEL
ALK PHOS: 74 U/L (ref 38–126)
ALT: 21 U/L (ref 14–54)
AST: 39 U/L (ref 15–41)
Albumin: 3.5 g/dL (ref 3.5–5.0)
Anion gap: 4 — ABNORMAL LOW (ref 5–15)
BILIRUBIN TOTAL: 2 mg/dL — AB (ref 0.3–1.2)
CALCIUM: 8.7 mg/dL — AB (ref 8.9–10.3)
CO2: 24 mmol/L (ref 22–32)
CREATININE: 0.57 mg/dL (ref 0.44–1.00)
Chloride: 113 mmol/L — ABNORMAL HIGH (ref 101–111)
GFR calc Af Amer: >60 mL/min (ref >60)
Glucose, Bld: 106 mg/dL — ABNORMAL HIGH (ref 65–99)
Potassium: 4.4 mmol/L (ref 3.5–5.1)
Sodium: 141 mmol/L (ref 135–145)
TOTAL PROTEIN: 6.5 g/dL (ref 6.5–8.1)

## 2016-01-25 MED ORDER — DIPHENHYDRAMINE HCL 50 MG/ML IJ SOLN
25.0000 mg | INTRAMUSCULAR | Status: DC | PRN
  Administered 2016-01-25 – 2016-01-28 (×16): 25 mg via INTRAVENOUS
  Filled 2016-01-25 (×32): qty 0.5

## 2016-01-25 MED ORDER — OXYCODONE-ACETAMINOPHEN 10-325 MG PO TABS: 1.0000 | ORAL_TABLET | ORAL | Status: DC

## 2016-01-25 MED ORDER — OXYCODONE-ACETAMINOPHEN 5-325 MG PO TABS
1.0000 | ORAL_TABLET | ORAL | Status: DC
  Administered 2016-01-25 – 2016-01-27 (×11): 1 via ORAL
  Filled 2016-01-25 (×11): qty 1

## 2016-01-25 MED ORDER — OXYCODONE HCL 5 MG PO TABS
5.0000 mg | ORAL_TABLET | ORAL | Status: DC
  Administered 2016-01-25 – 2016-01-27 (×11): 5 mg via ORAL
  Filled 2016-01-25 (×11): qty 1

## 2016-01-25 NOTE — Progress Notes (Addendum)
Subjective: Patient is still complaining of 9 out of 10 pain. She has used 75 mg with 80 demands and 58deliveries in the last 24 hours.  No nausea vomiting or diarrhea. She has been able to move around.  Objective: Vital signs in last 24 hours: Temp:  [98.2 F (36.8 C)-98.7 F (37.1 C)] 98.7 F (37.1 C) (11/25 1005) Pulse Rate:  [79-99] 90 (11/25 1005) Resp:  [13-18] 17 (11/25 1215) BP: (82-100)/(45-71) 95/64 (11/25 1005) SpO2:  [93 %-97 %] 95 % (11/25 1215) Weight change:  Last BM Date: 01/24/16  Intake/Output from previous day: 11/24 0701 - 11/25 0700 In: 900 [I.V.:900] Out: -  Intake/Output this shift: No intake/output data recorded.  General appearance: alert, cooperative and no distress Head: Normocephalic, without obvious abnormality, atraumatic Back: symmetric, no curvature. ROM normal. No CVA tenderness. Resp: clear to auscultation bilaterally Chest wall: no tenderness Cardio: regular rate and rhythm, S1, S2 normal, no murmur, click, rub or gallop GI: soft, non-tender; bowel sounds normal; no masses,  no organomegaly Extremities: extremities normal, atraumatic, no cyanosis or edema Pulses: 2+ and symmetric Skin: Skin color, texture, turgor normal. No rashes or lesions Neurologic: Grossly normal  Lab Results:  Recent Labs  01/24/16 1135 01/25/16 0735  WBC 10.7* 10.7*  HGB 7.8* 7.8*  HCT 22.3* 22.9*  PLT 258 255   BMET  Recent Labs  01/24/16 1135 01/25/16 0735  NA 141 141  K 4.5 4.4  CL 114* 113*  CO2 23 24  GLUCOSE 108* 106*  BUN <5* <5*  CREATININE 0.41* 0.57  CALCIUM 8.6* 8.7*    Studies/Results: No results found.  Medications: I have reviewed the patient's current medications.  Assessment/Plan: A 30 year old female admitted with sickle cell painful crisis.  #1 sickle cell painful crisis:patient will be maintained on current Dilaudid PCA dose. I will discontinue physician assisted dose and start her home Percoce. Continue MS Contin.  Continue with Toradol. Continue IV hydration. Reassess pain in the morning.  #2 sickle cell anemia:hemoglobin appears to be at her baseline. Continue to monitor.  #3chronic pain syndrome: Continue home medications and counseling provided.  #4 hypokalemia:this has been repleted  #5 neuropathy:continue Neurontin.  #6 depression with anxiety:ontinue home medications    LOS: 2 days   Dunya Meiners,LAWAL 01/25/2016, 2:44 PM

## 2016-01-25 NOTE — Progress Notes (Signed)
PHARMACIST - PHYSICIAN COMMUNICATION  DR:  Ashley RoyaltyMatthews CONCERNING: IV to Oral Route Change Policy  RECOMMENDATION: This patient is receiving diphenhydramine by the intravenous route.  Based on criteria approved by the Pharmacy and Therapeutics Committee, intravenous diphenhydramine is being converted to the equivalent oral dose form(s).   DESCRIPTION: These criteria include:  Diphenhydramine is not prescribed to treat or prevent a severe allergic reaction  Diphenhydramine is not prescribed as premedication prior to receiving blood product, biologic medication, antimicrobial, or chemotherapy agent  The patient has tolerated at least one dose of an oral or enteral medication  The patient has no evidence of active gastrointestinal bleeding or impaired GI absorption (gastrectomy, short bowel, patient on TNA or NPO).  The patient is not undergoing procedural sedation   If you have questions about this conversion, please contact the Pharmacy Department  []   [phone]( [phone] )  [facility] []   [phone]( [phone] )  [facility] []   [phone]( [phone] )  [facility] []   [phone]( [phone] )  [facility] [x]   [phone]( [phone] )  [facility]  Junita PushMichelle Bakari Nikolai, PharmD, BCPS Pager: [phone] 01/25/2016@12 :38 PM

## 2016-01-26 DIAGNOSIS — I959 Hypotension, unspecified: Secondary | ICD-10-CM | POA: Diagnosis not present

## 2016-01-26 MED ORDER — HYDROMORPHONE HCL 1 MG/ML IJ SOLN
0.5000 mg | Freq: Once | INTRAMUSCULAR | Status: AC
  Administered 2016-01-26: 0.5 mg via INTRAVENOUS
  Filled 2016-01-26: qty 1

## 2016-01-26 MED ORDER — HYDROMORPHONE 1 MG/ML IV SOLN
INTRAVENOUS | Status: DC
  Administered 2016-01-26: 12:00:00 via INTRAVENOUS
  Administered 2016-01-26: 0.8 mg via INTRAVENOUS
  Administered 2016-01-26: 12.1 mg via INTRAVENOUS
  Administered 2016-01-26: 22:00:00 via INTRAVENOUS
  Administered 2016-01-27: 3.2 mg via INTRAVENOUS
  Administered 2016-01-27: 8.8 mg via INTRAVENOUS
  Administered 2016-01-27: 11.96 mg via INTRAVENOUS
  Administered 2016-01-27: 4.74 mg via INTRAVENOUS
  Filled 2016-01-26 (×3): qty 25

## 2016-01-26 NOTE — Progress Notes (Signed)
Subjective: Patient still on Dilaudid PCA and toradol. Complaining of continuing pain despite being on Dilaudid PCA and using 67 mg with 68 demands and 68 deliveries in the klast 24 hours and on her home medications including MS Contin and short acting. I discontinued her physician assisted dose yesterday. No evidence of decompensation except for her hypotension.   Objective: Vital signs in last 24 hours: Temp:  [97.6 F (36.4 C)-98.9 F (37.2 C)] 98.8 F (37.1 C) (11/26 1335) Pulse Rate:  [90-93] 93 (11/26 1335) Resp:  [11-21] 16 (11/26 1627) BP: (84-101)/(54-66) 98/66 (11/26 1335) SpO2:  [92 %-100 %] 96 % (11/26 1627) Weight:  [56.9 kg (125 lb 6.4 oz)] 56.9 kg (125 lb 6.4 oz) (11/26 0406) Weight change:  Last BM Date: 01/25/16  Intake/Output from previous day: 11/25 0701 - 11/26 0700 In: 1140 [P.O.:240; I.V.:900] Out: -  Intake/Output this shift: Total I/O In: 360 [P.O.:360] Out: -   General appearance: alert, cooperative and no distress Head: Normocephalic, without obvious abnormality, atraumatic Back: symmetric, no curvature. ROM normal. No CVA tenderness. Resp: clear to auscultation bilaterally Chest wall: no tenderness Cardio: regular rate and rhythm, S1, S2 normal, no murmur, click, rub or gallop GI: soft, non-tender; bowel sounds normal; no masses,  no organomegaly Extremities: extremities normal, atraumatic, no cyanosis or edema Pulses: 2+ and symmetric Skin: Skin color, texture, turgor normal. No rashes or lesions Neurologic: Grossly normal  Lab Results:  Recent Labs  01/24/16 1135 01/25/16 0735  WBC 10.7* 10.7*  HGB 7.8* 7.8*  HCT 22.3* 22.9*  PLT 258 255   BMET  Recent Labs  01/24/16 1135 01/25/16 0735  NA 141 141  K 4.5 4.4  CL 114* 113*  CO2 23 24  GLUCOSE 108* 106*  BUN <5* <5*  CREATININE 0.41* 0.57  CALCIUM 8.6* 8.7*    Studies/Results: No results found.  Medications: I have reviewed the patient's current  medications.  Assessment/Plan: A 30 year old female admitted with sickle cell painful crisis.  #1 sickle cell painful crisis: I will decrease her Dilaudid bolus dose from 1.1 mg to 0.8 mg today. I will continue home MS Contin on her Percocet. Continue with Toradol. Continue IV hydration. Reassess pain in the morning. Plan for discharge next 24-48 hours.  #2 sickle cell anemia:hemoglobin appears to be at her baseline.continue to monitor.  #3chronic pain syndrome: Continue home medications and counseling provided.  #4 hypokalemia:this has been repleted  #5 neuropathy:continue Neurontin.  #6 depression with anxiety:ontinue home medications    LOS: 3 days   GARBA,LAWAL 01/26/2016, 6:03 PM

## 2016-01-27 DIAGNOSIS — G894 Chronic pain syndrome: Secondary | ICD-10-CM

## 2016-01-27 LAB — RETICULOCYTES
RBC.: 2.06 MIL/uL — ABNORMAL LOW (ref 3.87–5.11)
RETIC CT PCT: 10.2 % — AB (ref 0.4–3.1)
Retic Count, Absolute: 210.1 10*3/uL — ABNORMAL HIGH (ref 19.0–186.0)

## 2016-01-27 MED ORDER — HYDROMORPHONE 1 MG/ML IV SOLN
INTRAVENOUS | Status: DC
  Administered 2016-01-27: 21:00:00 via INTRAVENOUS
  Administered 2016-01-27: 16.8 mg via INTRAVENOUS
  Administered 2016-01-28: 6.6 mg via INTRAVENOUS
  Administered 2016-01-28: 14.18 mg via INTRAVENOUS
  Administered 2016-01-28: 10:00:00 via INTRAVENOUS
  Filled 2016-01-27 (×2): qty 25

## 2016-01-27 NOTE — Progress Notes (Signed)
Nursing Note: Pt requested on-call be notified for pain meds.A: Paged on-call and obtained one time order for ,5 mg of dilaudid.once.wbb

## 2016-01-27 NOTE — Progress Notes (Addendum)
Patient is refusing all oral meds because she can not have benadryl IV due to tolerating oral meds and diet. Dr Ashley RoyaltyMatthews made aware.

## 2016-01-27 NOTE — Progress Notes (Signed)
Patient vomited approximately 200 clear emesis with fruit loop pieces. Patient states she is unable to tolerate po meds and requesting benadryl IV for itching. Benadryl has been ordered from pharmacy.

## 2016-01-27 NOTE — Progress Notes (Signed)
Pt reporting pain uncontrolled by PCA with 10/10 pain.  Unable to take po meds as pt reports vomiting.  Pt requesting additional IV pain meds to help "get through the night."  Paged on call floor coverage to notify.

## 2016-01-27 NOTE — Progress Notes (Signed)
SICKLE CELL SERVICE PROGRESS NOTE  Brittany CampbellMiranda Foster JYN:[id number] DOB: 03/12/1985 DOA: 01/22/2016 PCP: Brittany LewandowskyJEGEDE, OLUGBEMIGA, MD  Assessment/Plan: Principal Problem:   Hb-SS disease with crisis (HCC) Active Problems:   Neuropathy (HCC)   Hypokalemia   Chronic pain syndrome   Chest pain   Intractable pain   Depression, major, recurrent (HCC)   Opiate dependence (HCC)   Sickle-cell crisis (HCC)   Sickle cell crisis (HCC)   Hypotension  1. Hb SS with crisis: Pt has used 36.31 mg with 66/48:demands/deliveries in the last 20 hours. Will start de-escalating PCA and decrease bolus dose to 0.6 mg. Continue Toradol. 2. Emesis: Pt claims emesis. Clinically her abdomen is benign on examination and her mucosa is moist. Quite frankly I do not believe that she has been having emesis but that she is claiming inability to tolerate oral intake so that she can have the Benadryl changed to IV route. Particularly since she refused anti-emetics and  ate 75% of her meal last night and she has empty cracker wrappers and crumbs in the bed. Additionally she has is not clinically de-hydrated. However I have given her an emesis bag and asked that she collect all vomitus and nurse be notified. If in fact she does have clinically significant emesis, will address accordingly.  3. Leukocytosis: Pt had negligible elevation in WBC on 11.25. Will recheck CBC with diff and reticulocytes tomorrow.  4. Anemia of Chronic Disease: Hb stable on last evaluation.  5. Chronic pain: Unsure of patient's adherence to pain medications at home.  6. Medication non-compliance: Pt claims adherence to Hydrea however MCV is inconsistent with compliance.   Code Status: Full Code Family Communication: N/A Disposition Plan: Anticipate discharge tomorrow.   Brittany A.  Pager [phone]. If 7PM-7AM, please contact night-coverage.  01/27/2016, 3:17 PM  LOS: 4 days  Interim History: Pt states that she has been having emesis but claims  that she has walking to the trash can to vomit into it. She is unable to give a description of the vomitus. Nurse reports that she has not witness any emesis or contents of vomitus. Nurse also reports that patient took her morning medications without any problems and did not have any emesis at that time. However when it was time for her to receive Benadryl she then claimed that she could not receive oral Benadryl and needed IV since she was vomiting. Please not that this patient has frequently become very angry when she cannot receive IV Benadryl over oral route Benadryl in the past. She was offered anti-emetic medication (Phernergan) and refused it. Pt also reports that she has been walking to the trash can which is by the door of the room but is now refusing to ambulate.   Consultants:  None  Procedures:  None  Antibiotics:  None   Objective: Vitals:   01/27/16 0550 01/27/16 0841 01/27/16 0945 01/27/16 1204  BP: (!) 89/56  (!) 84/55   Pulse: 90  96   Resp: 16 15 17 16   Temp: 98.3 F (36.8 C)  98.5 F (36.9 C)   TempSrc: Oral  Oral   SpO2: 94% 94% 97% 98%  Weight:      Height:       Weight change:   Intake/Output Summary (Last 24 hours) at 01/27/16 1517 Last data filed at 01/26/16 1826  Gross per 24 hour  Intake             5053 ml  Output  0 ml  Net             5053 ml    General: Alert, awake, oriented x3, in no acute distress.  HEENT: Brittany Foster/AT PEERL, EOMI, anicteric OROPHARYNX:  Mucosa moist, No exudate/ erythema/lesions.  Heart: Regular rate and rhythm, without murmurs, rubs, gallops, PMI non-displaced, no heaves or thrills on palpation.  Lungs: Clear to auscultation, no wheezing or rhonchi noted. No increased vocal fremitus resonant to percussion  Abdomen: Soft, nontender, nondistended, positive bowel sounds, no masses no hepatosplenomegaly noted..  Neuro: No focal neurological deficits noted cranial nerves II through XII grossly intact.  Strength at  functional baseline in bilateral upper and lower extremities. Musculoskeletal: No warmth swelling or erythema around joints, no spinal tenderness noted. Psychiatric: Patient alert and oriented x3, good insight and cognition, good recent to remote recall.    Data Reviewed: Basic Metabolic Panel:  Recent Labs Lab 01/22/16 2034 01/24/16 1135 01/25/16 0735  NA 140 141 141  K 3.0* 4.5 4.4  CL 106 114* 113*  CO2 27 23 24   GLUCOSE 106* 108* 106*  BUN 6 <5* <5*  CREATININE 0.47 0.41* 0.57  CALCIUM 8.6* 8.6* 8.7*   Liver Function Tests:  Recent Labs Lab 01/22/16 2034 01/24/16 1135 01/25/16 0735  AST 37 37 39  ALT 19 20 21   ALKPHOS 78 75 74  BILITOT 2.1* 1.7* 2.0*  PROT 7.4 6.3* 6.5  ALBUMIN 4.1 3.5 3.5   No results for input(s): LIPASE, AMYLASE in the last 168 hours. No results for input(s): AMMONIA in the last 168 hours. CBC:  Recent Labs Lab 01/21/16 0839 01/22/16 2034 01/24/16 1135 01/25/16 0735  WBC 12.5* 11.1* 10.7* 10.7*  NEUTROABS 7.1 6.2 5.4 5.9  HGB 8.4* 8.6* 7.8* 7.8*  HCT 23.9* 24.1* 22.3* 22.9*  MCV 102.6* 102.6* 104.2* 105.0*  PLT 355 313 258 255   Cardiac Enzymes: No results for input(s): CKTOTAL, CKMB, CKMBINDEX, TROPONINI in the last 168 hours. BNP (last 3 results) No results for input(s): BNP in the last 8760 hours.  ProBNP (last 3 results) No results for input(s): PROBNP in the last 8760 hours.  CBG: No results for input(s): GLUCAP in the last 168 hours.  No results found for this or any previous visit (from the past 240 hour(s)).   Studies: Dg Chest 2 View  Result Date: 01/22/2016 CLINICAL DATA:  Shortness of breath. Arms, legs, and back hurt. History of sickle cell. Previous smoker. EXAM: CHEST  2 VIEW COMPARISON:  01/05/2016 FINDINGS: Right central venous catheter with tip over the low SVC region. No pneumothorax. Normal heart size and pulmonary vascularity. No focal airspace disease or consolidation. No blunting of costophrenic  angles. No pneumothorax. Surgical clips in the right upper quadrant. Old catheter fragment demonstrated in the upper mediastinum. This was present on previous CT from 10/19/2015. IMPRESSION: No active cardiopulmonary disease. Electronically Signed   By: Burman Nieves M.D.   On: 01/22/2016 22:27   Dg Chest 2 View  Result Date: 01/05/2016 CLINICAL DATA:  Sickle-cell crisis EXAM: CHEST  2 VIEW COMPARISON:  11/21/2015 FINDINGS: The heart size and mediastinal contours are within normal limits. Right central venous catheter tip is seen at the cavoatrial junction as before. Both lungs are clear. The visualized skeletal structures are unremarkable. Cholecystectomy clips in the right upper quadrant. IMPRESSION: No active cardiopulmonary disease. Electronically Signed   By: Tollie Eth M.D.   On: 01/05/2016 23:57   Mr Hip Right Wo Contrast  Result Date: 01/11/2016 CLINICAL DATA:  SICKLE CELL DISEASE.  BILATERAL HIP PAIN.  LEG PAIN. EXAM: MR OF THE RIGHT HIP WITHOUT CONTRAST MR OF THE LEFT HIP WITHOUT CONTRAST TECHNIQUE: Multiplanar, multisequence MR imaging was performed. No intravenous contrast was administered. COMPARISON:  None. FINDINGS: Bones: Diffuse T1 and T2 hypo intense marrow throughout the lower lumbar spine, pelvis and femurs. No hip fracture, dislocation or avascular necrosis. No evidence of a bone infarct. Normal sacrum and sacroiliac joints. No SI joint widening or erosive changes. Articular cartilage and labrum Articular cartilage:  No chondral defect. Labrum: Grossly intact, but evaluation is limited by lack of intraarticular fluid. Joint or bursal effusion Joint effusion:  No hip joint effusion.  No SI joint effusion. Bursae:  No bursa formation. Muscles and tendons Flexors: Normal. Extensors: Normal. Abductors: Normal. Adductors: Normal. Rotators: Normal. Hamstrings: Normal. Other findings Miscellaneous: Trace pelvic free fluid. No inguinal lymphadenopathy. No inguinal hernia. No adnexal mass.  IMPRESSION: 1. No hip fracture, dislocation or avascular necrosis. 2. Diffuse low signal marrow throughout the visualized osseous structures consistent with patient's history of sickle cell disease. Electronically Signed   By: Elige Ko   On: 01/11/2016 10:14   Mr Hip Left Wo Contrast  Result Date: 01/11/2016 CLINICAL DATA:  SICKLE CELL DISEASE.  BILATERAL HIP PAIN.  LEG PAIN. EXAM: MR OF THE RIGHT HIP WITHOUT CONTRAST MR OF THE LEFT HIP WITHOUT CONTRAST TECHNIQUE: Multiplanar, multisequence MR imaging was performed. No intravenous contrast was administered. COMPARISON:  None. FINDINGS: Bones: Diffuse T1 and T2 hypo intense marrow throughout the lower lumbar spine, pelvis and femurs. No hip fracture, dislocation or avascular necrosis. No evidence of a bone infarct. Normal sacrum and sacroiliac joints. No SI joint widening or erosive changes. Articular cartilage and labrum Articular cartilage:  No chondral defect. Labrum: Grossly intact, but evaluation is limited by lack of intraarticular fluid. Joint or bursal effusion Joint effusion:  No hip joint effusion.  No SI joint effusion. Bursae:  No bursa formation. Muscles and tendons Flexors: Normal. Extensors: Normal. Abductors: Normal. Adductors: Normal. Rotators: Normal. Hamstrings: Normal. Other findings Miscellaneous: Trace pelvic free fluid. No inguinal lymphadenopathy. No inguinal hernia. No adnexal mass. IMPRESSION: 1. No hip fracture, dislocation or avascular necrosis. 2. Diffuse low signal marrow throughout the visualized osseous structures consistent with patient's history of sickle cell disease. Electronically Signed   By: Elige Ko   On: 01/11/2016 10:14    Scheduled Meds: . ARIPiprazole  5 mg Oral BID  . DULoxetine  60 mg Oral Daily  . enoxaparin (LOVENOX) injection  40 mg Subcutaneous Q24H  . folic acid  1 mg Oral Daily  . gabapentin  300 mg Oral TID  . HYDROmorphone   Intravenous Q4H  .  HYDROmorphone (DILAUDID) injection  0.5 mg  Subcutaneous Once  . hydroxyurea  1,000 mg Oral Daily  . ketorolac  30 mg Intravenous Q6H  . morphine  30 mg Oral Q12H  . oxyCODONE-acetaminophen  1 tablet Oral Q4H   And  . oxyCODONE  5 mg Oral Q4H  . potassium chloride SA  40 mEq Oral Daily  . senna-docusate  1 tablet Oral BID  . topiramate  50 mg Oral BID   Continuous Infusions: . 0.9 % NaCl with KCl 40 mEq / L 75 mL/hr (01/27/16 1315)    Principal Problem:   Hb-SS disease with crisis (HCC) Active Problems:   Neuropathy (HCC)   Hypokalemia   Chronic pain syndrome   Chest pain   Intractable pain   Depression, major, recurrent (HCC)  Opiate dependence (HCC)   Sickle-cell crisis (HCC)   Sickle cell crisis (HCC)   Hypotension    In excess of 25 minutes spent during this visit. Greater than 50% involved face to face contact with the patient for assessment, counseling and coordination of care.

## 2016-01-28 ENCOUNTER — Telehealth: Payer: Self-pay

## 2016-01-28 ENCOUNTER — Other Ambulatory Visit: Payer: Self-pay | Admitting: Internal Medicine

## 2016-01-28 ENCOUNTER — Ambulatory Visit: Payer: Medicaid Other | Admitting: Internal Medicine

## 2016-01-28 ENCOUNTER — Telehealth: Payer: Self-pay | Admitting: Hematology

## 2016-01-28 LAB — CBC WITH DIFFERENTIAL/PLATELET
Basophils Absolute: 0.1 10*3/uL (ref 0.0–0.1)
Basophils Relative: 1 %
EOS PCT: 2 %
Eosinophils Absolute: 0.2 10*3/uL (ref 0.0–0.7)
HCT: 21.8 % — ABNORMAL LOW (ref 36.0–46.0)
Hemoglobin: 7.6 g/dL — ABNORMAL LOW (ref 12.0–15.0)
LYMPHS PCT: 28 %
Lymphs Abs: 3 10*3/uL (ref 0.7–4.0)
MCH: 36.2 pg — AB (ref 26.0–34.0)
MCHC: 34.9 g/dL (ref 30.0–36.0)
MCV: 103.8 fL — ABNORMAL HIGH (ref 78.0–100.0)
MONOS PCT: 8 %
Monocytes Absolute: 0.9 10*3/uL (ref 0.1–1.0)
NEUTROS PCT: 61 %
Neutro Abs: 6.6 10*3/uL (ref 1.7–7.7)
PLATELETS: 251 10*3/uL (ref 150–400)
RBC: 2.1 MIL/uL — ABNORMAL LOW (ref 3.87–5.11)
RDW: 21.5 % — ABNORMAL HIGH (ref 11.5–15.5)
WBC: 10.8 10*3/uL — AB (ref 4.0–10.5)

## 2016-01-28 LAB — BASIC METABOLIC PANEL
Anion gap: 4 — ABNORMAL LOW (ref 5–15)
BUN: 9 mg/dL (ref 6–20)
CHLORIDE: 114 mmol/L — AB (ref 101–111)
CO2: 23 mmol/L (ref 22–32)
CREATININE: 0.53 mg/dL (ref 0.44–1.00)
Calcium: 8.7 mg/dL — ABNORMAL LOW (ref 8.9–10.3)
GFR calc non Af Amer: >60 mL/min (ref >60)
Glucose, Bld: 104 mg/dL — ABNORMAL HIGH (ref 65–99)
Potassium: 4.8 mmol/L (ref 3.5–5.1)
Sodium: 141 mmol/L (ref 135–145)

## 2016-01-28 LAB — RETICULOCYTES
RBC.: 2.1 MIL/uL — ABNORMAL LOW (ref 3.87–5.11)
RETIC CT PCT: 10.4 % — AB (ref 0.4–3.1)
Retic Count, Absolute: 218.4 10*3/uL — ABNORMAL HIGH (ref 19.0–186.0)

## 2016-01-28 MED ORDER — DEXTROSE-NACL 5-0.45 % IV SOLN
INTRAVENOUS | Status: DC
  Administered 2016-01-28: 10:00:00 via INTRAVENOUS

## 2016-01-28 MED ORDER — OXYCODONE-ACETAMINOPHEN 10-325 MG PO TABS: 1.0000 | ORAL_TABLET | ORAL | 0 refills | Status: DC | PRN

## 2016-01-28 NOTE — Progress Notes (Signed)
SICKLE CELL SERVICE PROGRESS NOTE  Brittany CampbellMiranda Bissette ZOX:[id number] DOB: 08/07/1985 DOA: 01/22/2016 PCP: Jeanann LewandowskyJEGEDE, OLUGBEMIGA, MD  Assessment/Plan: Principal Problem:   Hb-SS disease with crisis (HCC) Active Problems:   Neuropathy (HCC)   Hypokalemia   Chronic pain syndrome   Chest pain   Intractable pain   Depression, major, recurrent (HCC)   Opiate dependence (HCC)   Sickle-cell crisis (HCC)   Sickle cell crisis (HCC)   Hypotension  1. Hb SS with crisis: Pt up ambulating without difficulty.D/C PCA.  Oral medications scheduled and will consider intermittent IV medications  If necessary. 2. Emesis: Pt claims emesis. Clinically her abdomen is benign on examination and her mucosa is moist.  She now claims abdominal pain. Will obtain a KUB and hold IV opiates due to their highly emetic nature then will give oral trial .  3. Leukocytosis: Pt had negligible elevation in WBC at 10.8. There is no evidence of infection.  4. Anemia of Chronic Disease: Hb stable at 7.6 mg.  5. Chronic pain: Unsure of patient's adherence to pain medications at home.  6. Medication non-compliance: Pt claims adherence to Hydrea however MCV is inconsistent with compliance.   Code Status: Full Code Family Communication: N/A Disposition Plan: Anticipate discharge tomorrow.   Jarriel Papillion A.  Pager [phone]. If 7PM-7AM, please contact night-coverage.  01/28/2016, 3:46 PM  LOS: 5 days  Interim History: Pt states that she has been having emesis but is refusing to try even clear liquids. She states that her nausea is severe and that she has abdominal pain. Her last BM was 2 days ago.. I have discussed with patient the need to remove all emetic medications.   Consultants:  None  Procedures:  None  Antibiotics:  None   Objective: Vitals:   01/28/16 0741 01/28/16 0952 01/28/16 1002 01/28/16 1200  BP:   92/60   Pulse:   (!) 101   Resp: 15 16 10 16   Temp:   99.1 F (37.3 C)   TempSrc:   Oral   SpO2:  100% 99% 98% 100%  Weight:      Height:       Weight change:   Intake/Output Summary (Last 24 hours) at 01/28/16 1546 Last data filed at 01/28/16 1002  Gross per 24 hour  Intake              920 ml  Output             1125 ml  Net             -205 ml    General: Alert, awake, oriented x3, in no acute distress. Well appearing and ambulating without difficulty.  HEENT: [place]/AT PEERL, EOMI, anicteric OROPHARYNX:  Mucosa moist, No exudate/ erythema/lesions.  Heart: Regular rate and rhythm, without murmurs, rubs, gallops, PMI non-displaced, no heaves or thrills on palpation.  Lungs: Clear to auscultation, no wheezing or rhonchi noted. No increased vocal fremitus resonant to percussion  Abdomen: Soft, nontender, nondistended, positive bowel sounds, no masses no hepatosplenomegaly noted.  Neuro: No focal neurological deficits noted cranial nerves II through XII grossly intact.  Strength at functional baseline in bilateral upper and lower extremities. Musculoskeletal: No warmth swelling or erythema around joints, no spinal tenderness noted. Psychiatric: Patient alert and oriented x3, good insight and cognition, good recent to remote recall.    Data Reviewed: Basic Metabolic Panel:  Recent Labs Lab 01/22/16 2034 01/24/16 1135 01/25/16 0735 01/28/16 0404  NA 140 141 141 141  K 3.0* 4.5 4.4  4.8  CL 106 114* 113* 114*  CO2 27 23 24 23   GLUCOSE 106* 108* 106* 104*  BUN 6 <5* <5* 9  CREATININE 0.47 0.41* 0.57 0.53  CALCIUM 8.6* 8.6* 8.7* 8.7*   Liver Function Tests:  Recent Labs Lab 01/22/16 2034 01/24/16 1135 01/25/16 0735  AST 37 37 39  ALT 19 20 21   ALKPHOS 78 75 74  BILITOT 2.1* 1.7* 2.0*  PROT 7.4 6.3* 6.5  ALBUMIN 4.1 3.5 3.5   No results for input(s): LIPASE, AMYLASE in the last 168 hours. No results for input(s): AMMONIA in the last 168 hours. CBC:  Recent Labs Lab 01/22/16 2034 01/24/16 1135 01/25/16 0735 01/28/16 0404  WBC 11.1* 10.7* 10.7* 10.8*   NEUTROABS 6.2 5.4 5.9 6.6  HGB 8.6* 7.8* 7.8* 7.6*  HCT 24.1* 22.3* 22.9* 21.8*  MCV 102.6* 104.2* 105.0* 103.8*  PLT 313 258 255 251   Cardiac Enzymes: No results for input(s): CKTOTAL, CKMB, CKMBINDEX, TROPONINI in the last 168 hours. BNP (last 3 results) No results for input(s): BNP in the last 8760 hours.  ProBNP (last 3 results) No results for input(s): PROBNP in the last 8760 hours.  CBG: No results for input(s): GLUCAP in the last 168 hours.  No results found for this or any previous visit (from the past 240 hour(s)).   Studies: Dg Chest 2 View  Result Date: 01/22/2016 CLINICAL DATA:  Shortness of breath. Arms, legs, and back hurt. History of sickle cell. Previous smoker. EXAM: CHEST  2 VIEW COMPARISON:  01/05/2016 FINDINGS: Right central venous catheter with tip over the low SVC region. No pneumothorax. Normal heart size and pulmonary vascularity. No focal airspace disease or consolidation. No blunting of costophrenic angles. No pneumothorax. Surgical clips in the right upper quadrant. Old catheter fragment demonstrated in the upper mediastinum. This was present on previous CT from 10/19/2015. IMPRESSION: No active cardiopulmonary disease. Electronically Signed   By: Burman Nieves M.D.   On: 01/22/2016 22:27   Dg Chest 2 View  Result Date: 01/05/2016 CLINICAL DATA:  Sickle-cell crisis EXAM: CHEST  2 VIEW COMPARISON:  11/21/2015 FINDINGS: The heart size and mediastinal contours are within normal limits. Right central venous catheter tip is seen at the cavoatrial junction as before. Both lungs are clear. The visualized skeletal structures are unremarkable. Cholecystectomy clips in the right upper quadrant. IMPRESSION: No active cardiopulmonary disease. Electronically Signed   By: Tollie Eth M.D.   On: 01/05/2016 23:57   Mr Hip Right Wo Contrast  Result Date: 01/11/2016 CLINICAL DATA:  SICKLE CELL DISEASE.  BILATERAL HIP PAIN.  LEG PAIN. EXAM: MR OF THE RIGHT HIP WITHOUT  CONTRAST MR OF THE LEFT HIP WITHOUT CONTRAST TECHNIQUE: Multiplanar, multisequence MR imaging was performed. No intravenous contrast was administered. COMPARISON:  None. FINDINGS: Bones: Diffuse T1 and T2 hypo intense marrow throughout the lower lumbar spine, pelvis and femurs. No hip fracture, dislocation or avascular necrosis. No evidence of a bone infarct. Normal sacrum and sacroiliac joints. No SI joint widening or erosive changes. Articular cartilage and labrum Articular cartilage:  No chondral defect. Labrum: Grossly intact, but evaluation is limited by lack of intraarticular fluid. Joint or bursal effusion Joint effusion:  No hip joint effusion.  No SI joint effusion. Bursae:  No bursa formation. Muscles and tendons Flexors: Normal. Extensors: Normal. Abductors: Normal. Adductors: Normal. Rotators: Normal. Hamstrings: Normal. Other findings Miscellaneous: Trace pelvic free fluid. No inguinal lymphadenopathy. No inguinal hernia. No adnexal mass. IMPRESSION: 1. No hip fracture, dislocation or  avascular necrosis. 2. Diffuse low signal marrow throughout the visualized osseous structures consistent with patient's history of sickle cell disease. Electronically Signed   By: Elige KoHetal  Patel   On: 01/11/2016 10:14   Mr Hip Left Wo Contrast  Result Date: 01/11/2016 CLINICAL DATA:  SICKLE CELL DISEASE.  BILATERAL HIP PAIN.  LEG PAIN. EXAM: MR OF THE RIGHT HIP WITHOUT CONTRAST MR OF THE LEFT HIP WITHOUT CONTRAST TECHNIQUE: Multiplanar, multisequence MR imaging was performed. No intravenous contrast was administered. COMPARISON:  None. FINDINGS: Bones: Diffuse T1 and T2 hypo intense marrow throughout the lower lumbar spine, pelvis and femurs. No hip fracture, dislocation or avascular necrosis. No evidence of a bone infarct. Normal sacrum and sacroiliac joints. No SI joint widening or erosive changes. Articular cartilage and labrum Articular cartilage:  No chondral defect. Labrum: Grossly intact, but evaluation is  limited by lack of intraarticular fluid. Joint or bursal effusion Joint effusion:  No hip joint effusion.  No SI joint effusion. Bursae:  No bursa formation. Muscles and tendons Flexors: Normal. Extensors: Normal. Abductors: Normal. Adductors: Normal. Rotators: Normal. Hamstrings: Normal. Other findings Miscellaneous: Trace pelvic free fluid. No inguinal lymphadenopathy. No inguinal hernia. No adnexal mass. IMPRESSION: 1. No hip fracture, dislocation or avascular necrosis. 2. Diffuse low signal marrow throughout the visualized osseous structures consistent with patient's history of sickle cell disease. Electronically Signed   By: Elige KoHetal  Patel   On: 01/11/2016 10:14    Scheduled Meds: . ARIPiprazole  5 mg Oral BID  . DULoxetine  60 mg Oral Daily  . enoxaparin (LOVENOX) injection  40 mg Subcutaneous Q24H  . folic acid  1 mg Oral Daily  . gabapentin  300 mg Oral TID  .  HYDROmorphone (DILAUDID) injection  0.5 mg Subcutaneous Once  . hydroxyurea  1,000 mg Oral Daily  . morphine  30 mg Oral Q12H  . oxyCODONE-acetaminophen  1 tablet Oral Q4H   And  . oxyCODONE  5 mg Oral Q4H  . potassium chloride SA  40 mEq Oral Daily  . senna-docusate  1 tablet Oral BID  . topiramate  50 mg Oral BID   Continuous Infusions: . dextrose 5 % and 0.45% NaCl 10 mL/hr at 01/28/16 1017    Principal Problem:   Hb-SS disease with crisis (HCC) Active Problems:   Neuropathy (HCC)   Hypokalemia   Chronic pain syndrome   Chest pain   Intractable pain   Depression, major, recurrent (HCC)   Opiate dependence (HCC)   Sickle-cell crisis (HCC)   Sickle cell crisis (HCC)   Hypotension    In excess of 25 minutes spent during this visit. Greater than 50% involved face to face contact with the patient for assessment, counseling and coordination of care.

## 2016-01-28 NOTE — Progress Notes (Signed)
Patient upset and wants to leave AMA due to the plan of care that the doctor has discussed. Patient jumped out of bed and got dressed states that she is going home and refuses to allow the nurse to flush central line and refuses to sign the against medical advice papers.

## 2016-01-28 NOTE — Telephone Encounter (Signed)
Patient called the office and asked to speak with me.  Patient indicated that she had left the hospital AMA because she was not happy with the care she was receiving.  Patient went on to explain parts of her treatment such as having all her IV medication stopped.  Patient stated she was nauseous and had been vomiting.  Patient states the provider offered to have her stomach checked, but patient refused.  Patient was crying on the phone.  Patient expressed that she comes to the hospital to "take a break from life, her child, bills, and boyfriend."  I explained to the patient the importance of having tools to cope at home and suggested referring her for behavorial services and to put her in touch with the sickle cell agency.  Patient refused stating she wasn't going to talk to anyone.  I asked the patient what I could do specifically to help her at the time.  Patient replied nothing, she was just venting and upset.   / Patient then presented to the primary care clinic for her prescription.  Patient tearful and C/O pain.  I expressed that she could go to the ED if she felt like she needed to but patient refused.  I did advise the patient that she could contact the office of patient experience if she felt it necessary.  Patient refused contact information for them as well.

## 2016-01-28 NOTE — Progress Notes (Signed)
Patient continues to refuse oral meds stating she can not keep anything down.

## 2016-01-28 NOTE — Discharge Summary (Signed)
Brittany Foster MRN: [id number] DOB/AGE: 31/03/1985 31 y.o.  Admit date: 01/22/2016 Discharge date: 01/28/2016  Primary Care Physician:  Brittany Foster   Discharge Diagnoses:   Patient Active Problem List   Diagnosis Date Noted  . Hypotension 01/26/2016  . Opiate dependence (HCC) 01/23/2016  . MDD (major depressive disorder), recurrent severe, without psychosis (HCC) 01/10/2016  . Vitamin D deficiency 01/08/2015  . Chronic pain syndrome 01/08/2015  . 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 Anemia 02/09/2014  . Neuropathy (HCC) 02/09/2014  . Chronic headache disorder 02/09/2014    DISCHARGE MEDICATION: Not reconciled as patient left AGAINST MEDICAL ADVICE.    Consults:    SIGNIFICANT DIAGNOSTIC STUDIES:  Dg Chest 2 View  Result Date: 01/22/2016 CLINICAL DATA:  Shortness of breath. Arms, legs, and back hurt. History of sickle cell. Previous smoker. EXAM: CHEST  2 VIEW COMPARISON:  01/05/2016 FINDINGS: Right central venous catheter with tip over the low SVC region. No pneumothorax. Normal heart size and pulmonary vascularity. No focal airspace disease or consolidation. No blunting of costophrenic angles. No pneumothorax. Surgical clips in the right upper quadrant. Old catheter fragment demonstrated in the upper mediastinum. This was present on previous CT from 10/19/2015. IMPRESSION: No active cardiopulmonary disease. Electronically Signed   By: Burman NievesWilliam  Stevens M.D.   On: 01/22/2016 22:27   Dg Chest 2 View  Result Date: 01/05/2016 CLINICAL DATA:  Sickle-cell crisis EXAM: CHEST  2 VIEW COMPARISON:  11/21/2015 FINDINGS: The heart size and mediastinal contours are within normal limits. Right central venous catheter tip is seen at the cavoatrial junction as before. Both lungs are clear. The visualized skeletal structures are unremarkable. Cholecystectomy clips in the right upper quadrant. IMPRESSION: No active cardiopulmonary disease. Electronically Signed   By: Tollie Ethavid   Kwon M.D.   On: 01/05/2016 23:57   Mr Hip Right Wo Contrast  Result Date: 01/11/2016 CLINICAL DATA:  SICKLE CELL DISEASE.  BILATERAL HIP PAIN.  LEG PAIN. EXAM: MR OF THE RIGHT HIP WITHOUT CONTRAST MR OF THE LEFT HIP WITHOUT CONTRAST TECHNIQUE: Multiplanar, multisequence MR imaging was performed. No intravenous contrast was administered. COMPARISON:  None. FINDINGS: Bones: Diffuse T1 and T2 hypo intense marrow throughout the lower lumbar spine, pelvis and femurs. No hip fracture, dislocation or avascular necrosis. No evidence of a bone infarct. Normal sacrum and sacroiliac joints. No SI joint widening or erosive changes. Articular cartilage and labrum Articular cartilage:  No chondral defect. Labrum: Grossly intact, but evaluation is limited by lack of intraarticular fluid. Joint or bursal effusion Joint effusion:  No hip joint effusion.  No SI joint effusion. Bursae:  No bursa formation. Muscles and tendons Flexors: Normal. Extensors: Normal. Abductors: Normal. Adductors: Normal. Rotators: Normal. Hamstrings: Normal. Other findings Miscellaneous: Trace pelvic free fluid. No inguinal lymphadenopathy. No inguinal hernia. No adnexal mass. IMPRESSION: 1. No hip fracture, dislocation or avascular necrosis. 2. Diffuse low signal marrow throughout the visualized osseous structures consistent with patient's history of sickle cell disease. Electronically Signed   By: Elige KoHetal  Patel   On: 01/11/2016 10:14   Mr Hip Left Wo Contrast  Result Date: 01/11/2016 CLINICAL DATA:  SICKLE CELL DISEASE.  BILATERAL HIP PAIN.  LEG PAIN. EXAM: MR OF THE RIGHT HIP WITHOUT CONTRAST MR OF THE LEFT HIP WITHOUT CONTRAST TECHNIQUE: Multiplanar, multisequence MR imaging was performed. No intravenous contrast was administered. COMPARISON:  None. FINDINGS: Bones: Diffuse T1 and T2 hypo intense marrow throughout the lower lumbar spine, pelvis and femurs. No hip fracture, dislocation or avascular necrosis. No  evidence of a bone infarct. Normal  sacrum and sacroiliac joints. No SI joint widening or erosive changes. Articular cartilage and labrum Articular cartilage:  No chondral defect. Labrum: Grossly intact, but evaluation is limited by lack of intraarticular fluid. Joint or bursal effusion Joint effusion:  No hip joint effusion.  No SI joint effusion. Bursae:  No bursa formation. Muscles and tendons Flexors: Normal. Extensors: Normal. Abductors: Normal. Adductors: Normal. Rotators: Normal. Hamstrings: Normal. Other findings Miscellaneous: Trace pelvic free fluid. No inguinal lymphadenopathy. No inguinal hernia. No adnexal mass. IMPRESSION: 1. No hip fracture, dislocation or avascular necrosis. 2. Diffuse low signal marrow throughout the visualized osseous structures consistent with patient's history of sickle cell disease. Electronically Signed   By: Elige KoHetal  Patel   On: 01/11/2016 10:14      No results found for this or any previous visit (from the past 240 hour(s)).  BRIEF ADMITTING H & P: Brittany Foster is an 30 y.o. female with medical history significant of sickle cell anemia (SS disease), multiple hospitalizations (24 hospital admissions in the past 6 months), major depressive disorder, and migraines who [facility] complaints of 9/10 generalized pain on 01/21/16. She was treated with IV fluids, IV Toradol and PCA Dilaudid which brought her pain down to a 6/10. She was discharged home where, she says, the pain didn't get any better. She has been using OxyContin, 30 mg twice a day and oxycodone 10 mg every 4 hours without relief. She says that her pain is "all over". There is some associated nausea, but no vomiting or other symptoms.  ED Course:  The patient was given multiple doses of Dilaudid and Benadryl with no relief of her pain.   Hospital Course:  Present on Admission: . (Resolved) Sickle cell pain crisis (HCC) . (Resolved) Chest pain . Chronic pain syndrome . (Resolved) Depression, major, recurrent (HCC) .  (Resolved) Hb-SS disease with crisis (HCC) . (Resolved) Hypokalemia . (Resolved) Intractable pain . Opiate dependence (HCC) . (Resolved) Sickle-cell crisis (HCC) . (Resolved) Sickle cell crisis (HCC) . MDD (major depressive disorder), recurrent severe, without psychosis (HCC)  Pt was admitted with Hb SS with crisis. She was managed with Dilaudid via PCA, Toradol and IVF. Pt was being weaned off IV medications and throwing a tantrum because she received Benadryl by the oral route and not via IV route. However she was not noted to demonstrate any scratching or excoriation. Yesterday she tolerated her morning medication orally and then at the time she was told that her Benadryl would be given by oral route, she then reported that she was having emesis without nausea. The emesis was unwitmesed and despite her stating that she was unable to ambulate due to her pain, she reported that she was actually ambulating to the trash can that was at least 40 feet away by the door to vomit into the trash can. She then had about 100-200 ml of clear liquid mixed with completely undigested bread. Thereafter she refused to take anything by mouth and even initially refused the antiemetic medication as she she stated that she was having emesis without any nausea. She was advised that the medication was also utilitus for emesis and she finally accepted one dose of antiemetic medication. Today she has refused to take anything by mouth stating that she was having intense nausea and was afraid to take anything by mouth. I advised patient that I would remove the most potent emetic agents and that included IV dilaudid. She also then  reported that she was now having abdominal pain although her abdominal examination was completely benign. However she had not had a BM for several days. Patient en became angry and refused the KUB that was ordered. As well jumped out of bed and was able to ambulate without any difficulty (despite her claims  of pain too intense to ambulate) patient pulled her monitors off and stated that she was leaving. She also refused her port being de-accessed and flushed. Pt refused to sign the AMA papers but left against medical advice.    Disposition and Follow-up: Left AGAINST MEDICAL ADVICE. She was observed walking without any difficulty.   DISCHARGE EXAM: See note from today.  Blood pressure 92/60, pulse (!) 101, temperature 99.1 F (37.3 C), temperature source Oral, resp. rate 16, height 5' (1.524 m), weight 56 kg (123 lb 6.4 oz), SpO2 100 %.   Recent Labs  01/28/16 0404  NA 141  K 4.8  CL 114*  CO2 23  GLUCOSE 104*  BUN 9  CREATININE 0.53  CALCIUM 8.7*   No results for input(s): AST, ALT, ALKPHOS, BILITOT, PROT, ALBUMIN in the last 72 hours. No results for input(s): LIPASE, AMYLASE in the last 72 hours.  Recent Labs  01/28/16 0404  WBC 10.8*  NEUTROABS 6.6  HGB 7.6*  HCT 21.8*  MCV 103.8*  PLT 251     Total time spent including face to face and decision making was less than 30 minutes  Signed: Chanita Boden A. 01/28/2016, 4:52 PM

## 2016-01-28 NOTE — Care Management Note (Signed)
Case Management Note  Patient Details  Name: Brittany CampbellMiranda Snee MRN: [id number] Date of Birth: 12/06/1985  Subjective/Objective:    30 [facility]                Action/Plan: From home with children. Chart reviewed and CM following for DC needs.  Expected Discharge Date:   (unknown)               Expected Discharge Plan:  Home/Self Care  In-House Referral:     Discharge planning Services  CM Consult  Post Acute Care Choice:    Choice offered to:     DME Arranged:    DME Agency:     HH Arranged:    HH Agency:     Status of Service:  In process, will continue to follow  If discussed at Long Length of Stay Meetings, dates discussed:    Additional CommentsBartholome Bill:  Magic Mohler H, RN 01/28/2016, 1:59 PM  [phone]

## 2016-01-29 ENCOUNTER — Telehealth: Payer: Self-pay | Admitting: Hematology

## 2016-01-29 NOTE — Telephone Encounter (Signed)
Called patient back and followed up to yesterday's call.  Patient in a better mood today. Patient  Patient states a neighbor had been taken care of her child while she was hospitalized.  Patient also indicated that she was happy to see her daughter on yesterday and she has made plans to have her daughter go stay with her grandmother for while.  Patient again refused services from [facility], or [facility].

## 2016-01-30 ENCOUNTER — Non-Acute Institutional Stay (HOSPITAL_COMMUNITY)
Admission: AD | Admit: 2016-01-30 | Discharge: 2016-01-30 | Disposition: A | Discharge status: Home / Self Care | Payer: Medicaid Other | Source: Ambulatory Visit | Attending: Internal Medicine | Admitting: Internal Medicine

## 2016-01-30 ENCOUNTER — Encounter (HOSPITAL_COMMUNITY): Payer: Self-pay

## 2016-01-30 DIAGNOSIS — M25551 Pain in right hip: Secondary | ICD-10-CM | POA: Insufficient documentation

## 2016-01-30 DIAGNOSIS — Z832 Family history of diseases of the blood and blood-forming organs and certain disorders involving the immune mechanism: Secondary | ICD-10-CM | POA: Insufficient documentation

## 2016-01-30 DIAGNOSIS — Z79899 Other long term (current) drug therapy: Secondary | ICD-10-CM | POA: Diagnosis not present

## 2016-01-30 DIAGNOSIS — D5700 Hb-SS disease with crisis, unspecified: Secondary | ICD-10-CM | POA: Diagnosis not present

## 2016-01-30 DIAGNOSIS — M25552 Pain in left hip: Secondary | ICD-10-CM | POA: Diagnosis not present

## 2016-01-30 LAB — CBC WITH DIFFERENTIAL/PLATELET
BASOS PCT: 1 %
Basophils Absolute: 0.1 10*3/uL (ref 0.0–0.1)
EOS PCT: 1 %
Eosinophils Absolute: 0.1 10*3/uL (ref 0.0–0.7)
HCT: 20.6 % — ABNORMAL LOW (ref 36.0–46.0)
HEMOGLOBIN: 7.2 g/dL — AB (ref 12.0–15.0)
LYMPHS PCT: 28 %
Lymphs Abs: 3.9 10*3/uL (ref 0.7–4.0)
MCH: 36 pg — AB (ref 26.0–34.0)
MCHC: 35 g/dL (ref 30.0–36.0)
MCV: 103 fL — AB (ref 78.0–100.0)
MONO ABS: 1.1 10*3/uL — AB (ref 0.1–1.0)
Monocytes Relative: 8 %
NEUTROS ABS: 8.8 10*3/uL — AB (ref 1.7–7.7)
NEUTROS PCT: 62 %
Platelets: 285 10*3/uL (ref 150–400)
RBC: 2 MIL/uL — ABNORMAL LOW (ref 3.87–5.11)
RDW: 21.9 % — ABNORMAL HIGH (ref 11.5–15.5)
WBC: 14 10*3/uL — ABNORMAL HIGH (ref 4.0–10.5)

## 2016-01-30 MED ORDER — DEXTROSE-NACL 5-0.45 % IV SOLN
INTRAVENOUS | Status: DC
  Administered 2016-01-30: 09:00:00 via INTRAVENOUS

## 2016-01-30 MED ORDER — HYDROMORPHONE 1 MG/ML IV SOLN
INTRAVENOUS | Status: DC
  Administered 2016-01-30: 9.5 mg via INTRAVENOUS
  Administered 2016-01-30: 17.5 mg via INTRAVENOUS
  Administered 2016-01-30: 09:00:00 via INTRAVENOUS
  Filled 2016-01-30: qty 25

## 2016-01-30 MED ORDER — ONDANSETRON HCL 4 MG/2ML IJ SOLN: 4.0000 mg | Freq: Four times a day (QID) | INTRAMUSCULAR | Status: DC | PRN

## 2016-01-30 MED ORDER — NALOXONE HCL 0.4 MG/ML IJ SOLN: 0.4000 mg | INTRAMUSCULAR | Status: DC | PRN

## 2016-01-30 MED ORDER — SODIUM CHLORIDE 0.9% FLUSH
10.0000 mL | INTRAVENOUS | Status: AC | PRN
  Administered 2016-01-30: 10 mL

## 2016-01-30 MED ORDER — SENNOSIDES-DOCUSATE SODIUM 8.6-50 MG PO TABS: 1.0000 | ORAL_TABLET | Freq: Two times a day (BID) | ORAL | Status: DC

## 2016-01-30 MED ORDER — SODIUM CHLORIDE 0.9% FLUSH: 9.0000 mL | INTRAVENOUS | Status: DC | PRN

## 2016-01-30 MED ORDER — KETOROLAC TROMETHAMINE 30 MG/ML IJ SOLN
30.0000 mg | Freq: Four times a day (QID) | INTRAMUSCULAR | Status: DC
  Administered 2016-01-30: 30 mg via INTRAVENOUS
  Filled 2016-01-30: qty 1

## 2016-01-30 MED ORDER — POLYETHYLENE GLYCOL 3350 17 G PO PACK: 17.0000 g | PACK | Freq: Every day | ORAL | Status: DC | PRN

## 2016-01-30 MED ORDER — PROMETHAZINE HCL 25 MG PO TABS
25.0000 mg | ORAL_TABLET | Freq: Four times a day (QID) | ORAL | Status: DC | PRN
  Administered 2016-01-30: 25 mg via ORAL
  Filled 2016-01-30 (×2): qty 1

## 2016-01-30 MED ORDER — SODIUM CHLORIDE 0.9 % IV SOLN
25.0000 mg | INTRAVENOUS | Status: DC | PRN
  Filled 2016-01-30: qty 0.5

## 2016-01-30 MED ORDER — HYDROMORPHONE HCL 2 MG/ML IJ SOLN
1.0000 mg | INTRAMUSCULAR | Status: DC | PRN
  Administered 2016-01-30: 1 mg via INTRAVENOUS
  Filled 2016-01-30: qty 1

## 2016-01-30 MED ORDER — HEPARIN SOD (PORK) LOCK FLUSH 100 UNIT/ML IV SOLN
250.0000 [IU] | INTRAVENOUS | Status: AC | PRN
  Administered 2016-01-30: 250 [IU]
  Filled 2016-01-30: qty 5

## 2016-01-30 MED ORDER — DIPHENHYDRAMINE HCL 25 MG PO CAPS
25.0000 mg | ORAL_CAPSULE | ORAL | Status: DC | PRN
  Administered 2016-01-30: 25 mg via ORAL
  Filled 2016-01-30: qty 1

## 2016-01-30 NOTE — Discharge Instructions (Signed)
Sickle Cell Anemia, Adult °Sickle cell anemia is a condition in which red blood cells have an abnormal “sickle” shape. This abnormal shape shortens the cells’ life span, which results in a lower than normal concentration of red blood cells in the blood. The sickle shape also causes the cells to clump together and block free blood flow through the blood vessels. As a result, the tissues and organs of the body do not receive enough oxygen. Sickle cell anemia causes organ damage and pain and increases the risk of infection. °What are the causes? °Sickle cell anemia is a genetic disorder. Those who receive two copies of the gene have the condition, and those who receive one copy have the trait. °What increases the risk? °The sickle cell gene is most common in people whose families originated in [place]. Other areas of the globe where sickle cell trait occurs include the Mediterranean, [place] and [place], the [place], and the [place]. °What are the signs or symptoms? °· Pain, especially in the extremities, back, chest, or abdomen (common). The pain may start suddenly or may develop following an illness, especially if there is dehydration. Pain can also occur due to overexertion or exposure to extreme temperature changes. °· Frequent severe bacterial infections, especially certain types of pneumonia and meningitis. °· Pain and swelling in the hands and feet. °· Decreased activity. °· Loss of appetite. °· Change in behavior. °· Headaches. °· Seizures. °· Shortness of breath or difficulty breathing. °· Vision changes. °· Skin ulcers. °Those with the trait may not have symptoms or they may have mild symptoms. °How is this diagnosed? °Sickle cell anemia is diagnosed with blood tests that demonstrate the genetic trait. It is often diagnosed during the newborn period, due to mandatory testing nationwide. A variety of blood tests, X-rays, CT scans, MRI scans, ultrasounds, and lung function tests may also be done to  monitor the condition. °How is this treated? °Sickle cell anemia may be treated with: °· Medicines. You may be given pain medicines, antibiotic medicines (to treat and prevent infections) or medicines to increase the production of certain types of hemoglobin. °· Fluids. °· Oxygen. °· Blood transfusions. ° °Follow these instructions at home: °· Drink enough fluid to keep your urine clear or pale yellow. Increase your fluid intake in hot weather and during exercise. °· Do not smoke. Smoking lowers oxygen levels in the blood. °· Only take over-the-counter or prescription medicines for pain, fever, or discomfort as directed by your health care provider. °· Take antibiotics as directed by your health care provider. Make sure you finish them it even if you start to feel better. °· Take supplements as directed by your health care provider. °· Consider wearing a medical alert bracelet. This tells anyone caring for you in an emergency of your condition. °· When traveling, keep your medical information, health care provider's names, and the medicines you take with you at all times. °· If you develop a fever, do not take medicines to reduce the fever right away. This could cover up a problem that is developing. Notify your health care provider. °· Keep all follow-up appointments with your health care provider. Sickle cell anemia requires regular medical care. °Contact a health care provider if: °You have a fever. °Get help right away if: °· You feel dizzy or faint. °· You have new abdominal pain, especially on the left side near the stomach area. °· You develop a persistent, often uncomfortable and painful penile erection (priapism). If this is not   treated immediately it will lead to impotence. °· You have numbness your arms or legs or you have a hard time moving them. °· You have a hard time with speech. °· You have a fever or persistent symptoms for more than 2-3 days. °· You have a fever and your symptoms suddenly get  worse. °· You have signs or symptoms of infection. These include: °? Chills. °? Abnormal tiredness (lethargy). °? Irritability. °? Poor eating. °? Vomiting. °· You develop pain that is not helped with medicine. °· You develop shortness of breath. °· You have pain in your chest. °· You are coughing up pus-like or bloody sputum. °· You develop a stiff neck. °· Your feet or hands swell or have pain. °· Your abdomen appears bloated. °· You develop joint pain. °This information is not intended to replace advice given to you by your health care provider. Make sure you discuss any questions you have with your health care provider. °Document Released: 05/27/2005 Document Revised: 09/06/2015 Document Reviewed: 09/28/2012 °Elsevier Interactive Patient Education © 2017 [employer]. ° °

## 2016-01-30 NOTE — H&P (Signed)
[facility] History and Physical  Jayle Solarz ZOX:[id number] DOB: Jan 21, 1986 DOA: 01/30/2016  PCP: Jeanann Lewandowsky, MD   Chief Complaint: Pain  HPI: Brittany Foster is a 30 y.o. female with history of sickle cell disease, opiate tolerant who presented to the day hospital today with complaint of pain in her back, hip joints and legs consistent with her sickle cell pain crisis. She was admitted to the hospital recently, she left AMA 2 days ago when her medications were changed to PO while she insisted they remained IV. She rated her pain at 9 out of 10 today, she last took her MS contin, Ibuprofen, and Oxycontin this morning with no sustained relief. She denies any fever, no urinary symptoms, no headache, no shortness of breath, no chest pain, no nausea, vomiting or diarrhea. No joint redness.  Systemic Review: General: The patient denies anorexia, fever, weight loss Cardiac: Denies chest pain, syncope, palpitations, pedal edema  Respiratory: Denies cough, shortness of breath, wheezing GI: Denies severe indigestion/heartburn, abdominal pain, nausea, vomiting, diarrhea and constipation GU: Denies hematuria, incontinence, dysuria  Musculoskeletal: Denies arthritis  Skin: Denies suspicious skin lesions Neurologic: Denies focal weakness or numbness, change in vision  Past Medical History:  Diagnosis Date  . Anemia   . Depression, major, recurrent (HCC)   . Migraines   . Sickle cell anemia (HCC)     Past Surgical History:  Procedure Laterality Date  . CESAREAN SECTION    . CHOLECYSTECTOMY  2000  . IR GENERIC HISTORICAL  10/08/2015   IR US GUIDE VASC ACCESS RIGHT 10/08/2015 Simonne Come, MD [facility]  . IR GENERIC HISTORICAL  10/08/2015   IR FLUORO GUIDE CV LINE RIGHT 10/08/2015 Simonne Come, MD [facility]  . MULTIPLE TOOTH EXTRACTIONS N/A   . port a cath placement Right    about 6-7 years ago  . removal of porta cath Right 09/11/15  . TUBAL LIGATION      Allergies   Allergen Reactions  . Ultram [Tramadol] Other (See Comments)    seizures  . Zofran [Ondansetron Hcl] Nausea And Vomiting  . Buprenorphine Hcl Hives and Rash    Shaking Tolerates Percocet, Norco, and buprenorphine  . Morphine And Related Hives, Rash and Other (See Comments)    Shaking Tolerates Percocet, Norco, Dilaudid, and buprenorphine  . Tape Rash    Family History  Problem Relation Age of Onset  . Sickle cell trait Father   . Sickle cell trait Mother   . Sickle cell anemia Other       Prior to Admission medications   Medication Sig Start Date End Date Taking? Authorizing Provider  ARIPiprazole (ABILIFY) 5 MG tablet Take 1 tablet (5 mg total) by mouth 2 (two) times daily. 01/12/16  Yes Honestii Marton Annitta Needs, MD  DULoxetine (CYMBALTA) 60 MG capsule TAKE 1 CAPSULE BY MOUTH DAILY. 11/27/15  Yes Quentin Angst, MD  folic acid (FOLVITE) 1 MG tablet Take 1 tablet (1 mg total) by mouth daily. 01/17/16  Yes Quentin Angst, MD  hydroxyurea (HYDREA) 500 MG capsule TAKE 2 CAPSULES BY MOUTH DAILY. MAY TAKE WITH FOOD TO MINIMIZE GI SIDE EFFECTS. 08/08/15  Yes [name], FNP  ibuprofen (ADVIL,MOTRIN) 200 MG tablet Take 400 mg by mouth every 6 (six) hours as needed for moderate pain.   Yes Historical Provider, MD  morphine (MS CONTIN) 30 MG 12 hr tablet Take 1 tablet (30 mg total) by mouth every 12 (twelve) hours. 12/31/15  Yes Quentin Angst, MD  oxyCODONE-acetaminophen (PERCOCET) 10-325 MG tablet Take 1 tablet by mouth every 4 (four) hours as needed for pain. 01/28/16 02/12/16 Yes Quentin Angstlugbemiga E Vena Bassinger, MD  Topiramate ER 100 MG CP24 Take 100 mg by mouth at bedtime.   Yes Historical Provider, MD  gabapentin (NEURONTIN) 300 MG capsule TAKE 1 CAPSULE BY MOUTH 3 TIMES DAILY. Patient not taking: Reported on 01/30/2016 11/27/15   Quentin Angstlugbemiga E Iretta Mangrum, MD  potassium chloride SA (K-DUR,KLOR-CON) 20 MEQ tablet Take 2 tablets (40 mEq total) by mouth daily. Patient not taking: Reported on  01/15/2016 11/23/15   Altha HarmMichelle A [name], MD   Physical Exam: Vitals:   01/30/16 0837  BP: 116/68  Pulse: (!) 102  Resp: 16  Temp: 98.6 F (37 C)  TempSrc: Oral  SpO2: 100%   General: Alert, awake, afebrile, anicteric, not in obvious distress HEENT: Normocephalic and Atraumatic, Mucous membranes pink                PERRLA; EOM intact; No scleral icterus,                 Nares: Patent, Oropharynx: Clear, Fair Dentition                 Neck: FROM, no cervical lymphadenopathy, thyromegaly, carotid bruit or JVD;  CHEST WALL: No tenderness  CHEST: Normal respiration, clear to auscultation bilaterally  HEART: Regular rate and rhythm; no murmurs rubs or gallops  BACK: No kyphosis or scoliosis; no CVA tenderness  ABDOMEN: Positive Bowel Sounds, soft, non-tender; no masses, no organomegaly EXTREMITIES: No cyanosis, clubbing, or edema SKIN:  no rash or ulceration  CNS: Alert and Oriented x 4, Nonfocal exam, CN 2-12 intact  Labs on Admission:  Basic Metabolic Panel:  Recent Labs Lab 01/24/16 1135 01/25/16 0735 01/28/16 0404  NA 141 141 141  K 4.5 4.4 4.8  CL 114* 113* 114*  CO2 23 24 23   GLUCOSE 108* 106* 104*  BUN <5* <5* 9  CREATININE 0.41* 0.57 0.53  CALCIUM 8.6* 8.7* 8.7*   Liver Function Tests:  Recent Labs Lab 01/24/16 1135 01/25/16 0735  AST 37 39  ALT 20 21  ALKPHOS 75 74  BILITOT 1.7* 2.0*  PROT 6.3* 6.5  ALBUMIN 3.5 3.5   No results for input(s): LIPASE, AMYLASE in the last 168 hours. No results for input(s): AMMONIA in the last 168 hours. CBC:  Recent Labs Lab 01/24/16 1135 01/25/16 0735 01/28/16 0404  WBC 10.7* 10.7* 10.8*  NEUTROABS 5.4 5.9 6.6  HGB 7.8* 7.8* 7.6*  HCT 22.3* 22.9* 21.8*  MCV 104.2* 105.0* 103.8*  PLT 258 255 251   Cardiac Enzymes: No results for input(s): CKTOTAL, CKMB, CKMBINDEX, TROPONINI in the last 168 hours.  BNP (last 3 results) No results for input(s): BNP in the last 8760 hours.  ProBNP (last 3 results) No  results for input(s): PROBNP in the last 8760 hours.  CBG: No results for input(s): GLUCAP in the last 168 hours.  Assessment/Plan Active Problems:   Sickle cell anemia with crisis (HCC)   Ad[facility] to the Day [facility]  IVF D5 .45% Saline @ 125 mls/hour  Weight based Dilaudid PCA started within 30 minutes of admission  IV Toradol 30 mg Q 6 H  Monitor vitals very closely, Re-evaluate pain scale every hour  2 L of Oxygen by   Patient will be re-evaluated for pain in the context of function and relationship to baseline as care progresses.  If no significant relieve from pain (remains above 5/10) will  transfer patient to inpatient services for further evaluation and management  Code Status: Full  Family Communication: None  DVT Prophylaxis: Ambulate as tolerated  Time spent: 35 Minutes  Miana Politte, MD, MHA, FACP, FAAP, CPE  If 7PM-7AM, please contact night-coverage [email or web address] 01/30/2016, 8:44 AM

## 2016-01-30 NOTE — Progress Notes (Signed)
Patient ID: Brittany CampbellMiranda Foster, female   DOB: 12/11/1985, 30 y.o.   MRN: [id number] [facility] for complaints of 9/10 pain intensity which is generalized. Steady strong gait on admission as well as discharge. Treated with IV fluids, IV Toradol, PCA Dilaudid, IV push Dilaudid. Pain level down to 6-7/10 at time of discharge. Patient agrees that she can manage pain at home. Went over discharge instructions and copy given to patient. Went over local job assistance resources with patient and she voiced her plan to try and get in touch with them about finding a job. Discharged to home and she is getting a ride in the "[place]".

## 2016-02-03 NOTE — Discharge Summary (Signed)
Physician Discharge Summary  Brittany Foster ZOX:[id number] DOB: Feb 08, 1986 DOA: 01/30/2016  PCP: Jeanann Lewandowsky, MD  Admit date: 01/30/2016  Discharge date: 01/30/2016  Time spent: 30 minutes  Discharge Diagnoses:  Active Problems:   Sickle cell anemia with crisis [facility])   Discharge Condition: Stable  Diet recommendation: Regular  There were no vitals filed for this visit.  History of present illness:  Brittany Foster is a 30 y.o. female with history of sickle cell disease, opiate tolerant who presented to the day hospital today with complaint of pain in her back, hip joints and legs consistent with her sickle cell pain crisis. She was admitted to the hospital recently, she left AMA 2 days ago when her medications were changed to PO while she insisted they remained IV. She rated her pain at 9 out of 10 today, she last took her MS contin, Ibuprofen, and Oxycontin this morning with no sustained relief. She denies any fever, no urinary symptoms, no headache, no shortness of breath, no chest pain, no nausea, vomiting or diarrhea. No joint redness.  Hospital Course:  Brittany Foster was admitted to the day hospital with sickle cell painful crisis. Patient was treated with weight based IV Dilaudid PCA, IV Toradol as well as IV fluids. Ann showed significant improvement symptomatically, pain improved from 9 to 5/10 at the time of discharge. Patient was discharged home in a hemodynamically stable condition. Brittany Foster will follow-up at the clinic as previously scheduled, continue with home medications as per prior to admission.  Discharge Instructions We discussed the need for good hydration, monitoring of hydration status, avoidance of heat, cold, stress, and infection triggers. We discussed the need to be compliant with taking Hydrea. Brittany Foster was reminded of the need to seek medical attention of any symptoms of bleeding, anemia, or infection occurs.  Discharge Exam: Vitals:   01/30/16 1336  01/30/16 1547  BP: 103/63   Pulse: 99   Resp: 13 13  Temp:     General appearance: alert, cooperative and no distress Eyes: conjunctivae/corneas clear. PERRL, EOM's intact. Fundi benign. Neck: no adenopathy, no carotid bruit, no JVD, supple, symmetrical, trachea midline and thyroid not enlarged, symmetric, no tenderness/mass/nodules Back: symmetric, no curvature. ROM normal. No CVA tenderness. Resp: clear to auscultation bilaterally Chest wall: no tenderness Cardio: regular rate and rhythm, S1, S2 normal, no murmur, click, rub or gallop GI: soft, non-tender; bowel sounds normal; no masses, no organomegaly Extremities: extremities normal, atraumatic, no cyanosis or edema Pulses: 2+ and symmetric Skin: Skin color, texture, turgor normal. No rashes or lesions Neurologic: Grossly normal  Discharge Medication List as of 01/30/2016  3:00 PM    CONTINUE these medications which have NOT CHANGED   Details  ARIPiprazole (ABILIFY) 5 MG tablet Take 1 tablet (5 mg total) by mouth 2 (two) times daily., Starting Sun 01/12/2016, Normal    DULoxetine (CYMBALTA) 60 MG capsule TAKE 1 CAPSULE BY MOUTH DAILY., Normal    folic acid (FOLVITE) 1 MG tablet Take 1 tablet (1 mg total) by mouth daily., Starting Fri 01/17/2016, Normal    hydroxyurea (HYDREA) 500 MG capsule TAKE 2 CAPSULES BY MOUTH DAILY. MAY TAKE WITH FOOD TO MINIMIZE GI SIDE EFFECTS., Normal    ibuprofen (ADVIL,MOTRIN) 200 MG tablet Take 400 mg by mouth every 6 (six) hours as needed for moderate pain., Historical Med    morphine (MS CONTIN) 30 MG 12 hr tablet Take 1 tablet (30 mg total) by mouth every 12 (twelve) hours., Starting Tue 12/31/2015, Print    oxyCODONE-acetaminophen (  PERCOCET) 10-325 MG tablet Take 1 tablet by mouth every 4 (four) hours as needed for pain., Starting Tue 01/28/2016, Until Wed 02/12/2016, Print    Topiramate ER 100 MG CP24 Take 100 mg by mouth at bedtime., Until Discontinued, Historical Med    gabapentin  (NEURONTIN) 300 MG capsule TAKE 1 CAPSULE BY MOUTH 3 TIMES DAILY., Normal    potassium chloride SA (K-DUR,KLOR-CON) 20 MEQ tablet Take 2 tablets (40 mEq total) by mouth daily., Starting Sat 11/23/2015, Normal       Allergies  Allergen Reactions  . Ultram [Tramadol] Other (See Comments)    seizures  . Zofran [Ondansetron Hcl] Nausea And Vomiting  . Buprenorphine Hcl Hives and Rash    Shaking Tolerates Percocet, Norco, and buprenorphine  . Morphine And Related Hives, Rash and Other (See Comments)    Shaking Tolerates Percocet, Norco, Dilaudid, and buprenorphine  . Tape Rash    Significant Diagnostic Studies: Dg Chest 2 View  Result Date: 01/22/2016 CLINICAL DATA:  Shortness of breath. Arms, legs, and back hurt. History of sickle cell. Previous smoker. EXAM: CHEST  2 VIEW COMPARISON:  01/05/2016 FINDINGS: Right central venous catheter with tip over the low SVC region. No pneumothorax. Normal heart size and pulmonary vascularity. No focal airspace disease or consolidation. No blunting of costophrenic angles. No pneumothorax. Surgical clips in the right upper quadrant. Old catheter fragment demonstrated in the upper mediastinum. This was present on previous CT from 10/19/2015. IMPRESSION: No active cardiopulmonary disease. Electronically Signed   By: Burman NievesWilliam  Stevens M.D.   On: 01/22/2016 22:27   Dg Chest 2 View  Result Date: 01/05/2016 CLINICAL DATA:  Sickle-cell crisis EXAM: CHEST  2 VIEW COMPARISON:  11/21/2015 FINDINGS: The heart size and mediastinal contours are within normal limits. Right central venous catheter tip is seen at the cavoatrial junction as before. Both lungs are clear. The visualized skeletal structures are unremarkable. Cholecystectomy clips in the right upper quadrant. IMPRESSION: No active cardiopulmonary disease. Electronically Signed   By: Tollie Ethavid  Kwon M.D.   On: 01/05/2016 23:57   Mr Hip Right Wo Contrast  Result Date: 01/11/2016 CLINICAL DATA:  SICKLE CELL  DISEASE.  BILATERAL HIP PAIN.  LEG PAIN. EXAM: MR OF THE RIGHT HIP WITHOUT CONTRAST MR OF THE LEFT HIP WITHOUT CONTRAST TECHNIQUE: Multiplanar, multisequence MR imaging was performed. No intravenous contrast was administered. COMPARISON:  None. FINDINGS: Bones: Diffuse T1 and T2 hypo intense marrow throughout the lower lumbar spine, pelvis and femurs. No hip fracture, dislocation or avascular necrosis. No evidence of a bone infarct. Normal sacrum and sacroiliac joints. No SI joint widening or erosive changes. Articular cartilage and labrum Articular cartilage:  No chondral defect. Labrum: Grossly intact, but evaluation is limited by lack of intraarticular fluid. Joint or bursal effusion Joint effusion:  No hip joint effusion.  No SI joint effusion. Bursae:  No bursa formation. Muscles and tendons Flexors: Normal. Extensors: Normal. Abductors: Normal. Adductors: Normal. Rotators: Normal. Hamstrings: Normal. Other findings Miscellaneous: Trace pelvic free fluid. No inguinal lymphadenopathy. No inguinal hernia. No adnexal mass. IMPRESSION: 1. No hip fracture, dislocation or avascular necrosis. 2. Diffuse low signal marrow throughout the visualized osseous structures consistent with patient's history of sickle cell disease. Electronically Signed   By: Elige KoHetal  Patel   On: 01/11/2016 10:14   Mr Hip Left Wo Contrast  Result Date: 01/11/2016 CLINICAL DATA:  SICKLE CELL DISEASE.  BILATERAL HIP PAIN.  LEG PAIN. EXAM: MR OF THE RIGHT HIP WITHOUT CONTRAST MR OF THE LEFT HIP WITHOUT  CONTRAST TECHNIQUE: Multiplanar, multisequence MR imaging was performed. No intravenous contrast was administered. COMPARISON:  None. FINDINGS: Bones: Diffuse T1 and T2 hypo intense marrow throughout the lower lumbar spine, pelvis and femurs. No hip fracture, dislocation or avascular necrosis. No evidence of a bone infarct. Normal sacrum and sacroiliac joints. No SI joint widening or erosive changes. Articular cartilage and labrum Articular  cartilage:  No chondral defect. Labrum: Grossly intact, but evaluation is limited by lack of intraarticular fluid. Joint or bursal effusion Joint effusion:  No hip joint effusion.  No SI joint effusion. Bursae:  No bursa formation. Muscles and tendons Flexors: Normal. Extensors: Normal. Abductors: Normal. Adductors: Normal. Rotators: Normal. Hamstrings: Normal. Other findings Miscellaneous: Trace pelvic free fluid. No inguinal lymphadenopathy. No inguinal hernia. No adnexal mass. IMPRESSION: 1. No hip fracture, dislocation or avascular necrosis. 2. Diffuse low signal marrow throughout the visualized osseous structures consistent with patient's history of sickle cell disease. Electronically Signed   By: Elige KoHetal  Patel   On: 01/11/2016 10:14    Signed:  Jeanann LewandowskyJEGEDE, Alexine Pilant MD, MHA, FACP, FAAP, CPE   02/03/2016, 12:31 PM

## 2016-02-05 ENCOUNTER — Telehealth: Payer: Self-pay | Admitting: Hematology

## 2016-02-05 ENCOUNTER — Other Ambulatory Visit: Payer: Self-pay | Admitting: Internal Medicine

## 2016-02-05 DIAGNOSIS — D571 Sickle-cell disease without crisis: Secondary | ICD-10-CM

## 2016-02-05 NOTE — Telephone Encounter (Signed)
Patient called asking to speak with Dr. Hyman HopesJegede.  Patient states she is currently in [facility] [place]  Patient states she is not currently happy with treatment there and wants Dr. Hyman HopesJegede to call her doctor.  I explained to the patient that if her treating physician wants to consult Dr. Hyman HopesJegede all they need to do is call the office here and we can put them in touch with him.  Patient verbalizes understanding.

## 2016-02-10 ENCOUNTER — Non-Acute Institutional Stay (HOSPITAL_COMMUNITY)
Admission: AD | Admit: 2016-02-10 | Discharge: 2016-02-10 | Disposition: A | Discharge status: Home / Self Care | Payer: Medicaid Other | Source: Ambulatory Visit | Attending: Internal Medicine | Admitting: Internal Medicine

## 2016-02-10 ENCOUNTER — Telehealth (HOSPITAL_COMMUNITY): Payer: Self-pay | Admitting: *Deleted

## 2016-02-10 ENCOUNTER — Encounter (HOSPITAL_COMMUNITY): Payer: Self-pay | Admitting: *Deleted

## 2016-02-10 ENCOUNTER — Telehealth: Payer: Self-pay

## 2016-02-10 DIAGNOSIS — D5700 Hb-SS disease with crisis, unspecified: Secondary | ICD-10-CM | POA: Diagnosis not present

## 2016-02-10 DIAGNOSIS — R52 Pain, unspecified: Secondary | ICD-10-CM | POA: Insufficient documentation

## 2016-02-10 DIAGNOSIS — Z87891 Personal history of nicotine dependence: Secondary | ICD-10-CM | POA: Diagnosis not present

## 2016-02-10 DIAGNOSIS — Z79899 Other long term (current) drug therapy: Secondary | ICD-10-CM | POA: Insufficient documentation

## 2016-02-10 LAB — URINALYSIS, ROUTINE W REFLEX MICROSCOPIC
Bilirubin Urine: NEGATIVE
GLUCOSE, UA: NEGATIVE mg/dL
KETONES UR: NEGATIVE mg/dL
NITRITE: NEGATIVE
PH: 5 (ref 5.0–8.0)
Protein, ur: NEGATIVE mg/dL
SPECIFIC GRAVITY, URINE: 1.016 (ref 1.005–1.030)

## 2016-02-10 LAB — CBC WITH DIFFERENTIAL/PLATELET
BASOS ABS: 0 10*3/uL (ref 0.0–0.1)
BASOS PCT: 0 %
EOS ABS: 0.1 10*3/uL (ref 0.0–0.7)
Eosinophils Relative: 1 %
HEMATOCRIT: 27.2 % — AB (ref 36.0–46.0)
Hemoglobin: 9.4 g/dL — ABNORMAL LOW (ref 12.0–15.0)
Lymphocytes Relative: 18 %
Lymphs Abs: 2 10*3/uL (ref 0.7–4.0)
MCH: 34.1 pg — ABNORMAL HIGH (ref 26.0–34.0)
MCHC: 34.6 g/dL (ref 30.0–36.0)
MCV: 98.6 fL (ref 78.0–100.0)
MONO ABS: 0.8 10*3/uL (ref 0.1–1.0)
Monocytes Relative: 8 %
NEUTROS ABS: 7.9 10*3/uL — AB (ref 1.7–7.7)
NEUTROS PCT: 73 %
Platelets: 301 10*3/uL (ref 150–400)
RBC: 2.76 MIL/uL — ABNORMAL LOW (ref 3.87–5.11)
RDW: 20.5 % — AB (ref 11.5–15.5)
WBC: 10.8 10*3/uL — ABNORMAL HIGH (ref 4.0–10.5)

## 2016-02-10 LAB — RAPID URINE DRUG SCREEN, HOSP PERFORMED
AMPHETAMINES: NOT DETECTED
BARBITURATES: NOT DETECTED
BENZODIAZEPINES: NOT DETECTED
COCAINE: NOT DETECTED
Opiates: POSITIVE — AB
TETRAHYDROCANNABINOL: NOT DETECTED

## 2016-02-10 LAB — COMPREHENSIVE METABOLIC PANEL
ALK PHOS: 96 U/L (ref 38–126)
ALT: 25 U/L (ref 14–54)
ANION GAP: 8 (ref 5–15)
AST: 44 U/L — ABNORMAL HIGH (ref 15–41)
Albumin: 4.6 g/dL (ref 3.5–5.0)
BILIRUBIN TOTAL: 2.6 mg/dL — AB (ref 0.3–1.2)
BUN: 14 mg/dL (ref 6–20)
CALCIUM: 9.1 mg/dL (ref 8.9–10.3)
CO2: 21 mmol/L — ABNORMAL LOW (ref 22–32)
Chloride: 109 mmol/L (ref 101–111)
Creatinine, Ser: 0.67 mg/dL (ref 0.44–1.00)
GFR calc non Af Amer: >60 mL/min (ref >60)
Glucose, Bld: 104 mg/dL — ABNORMAL HIGH (ref 65–99)
Potassium: 3.6 mmol/L (ref 3.5–5.1)
Sodium: 138 mmol/L (ref 135–145)
TOTAL PROTEIN: 7.7 g/dL (ref 6.5–8.1)

## 2016-02-10 MED ORDER — DEXTROSE-NACL 5-0.45 % IV SOLN
INTRAVENOUS | Status: DC
  Administered 2016-02-10: 10:00:00 via INTRAVENOUS

## 2016-02-10 MED ORDER — SODIUM CHLORIDE 0.9% FLUSH: 9.0000 mL | INTRAVENOUS | Status: DC | PRN

## 2016-02-10 MED ORDER — KETOROLAC TROMETHAMINE 30 MG/ML IJ SOLN
30.0000 mg | Freq: Once | INTRAMUSCULAR | Status: AC
  Administered 2016-02-10: 30 mg via INTRAVENOUS
  Filled 2016-02-10: qty 1

## 2016-02-10 MED ORDER — SODIUM CHLORIDE 0.9% FLUSH
10.0000 mL | INTRAVENOUS | Status: AC | PRN
  Administered 2016-02-10: 10 mL

## 2016-02-10 MED ORDER — NALOXONE HCL 0.4 MG/ML IJ SOLN: 0.4000 mg | INTRAMUSCULAR | Status: DC | PRN

## 2016-02-10 MED ORDER — SODIUM CHLORIDE 0.9 % IV SOLN
25.0000 mg | INTRAVENOUS | Status: DC | PRN
  Filled 2016-02-10: qty 0.5

## 2016-02-10 MED ORDER — HEPARIN SOD (PORK) LOCK FLUSH 100 UNIT/ML IV SOLN
250.0000 [IU] | INTRAVENOUS | Status: AC | PRN
  Administered 2016-02-10: 250 [IU]
  Filled 2016-02-10 (×2): qty 5

## 2016-02-10 MED ORDER — DIPHENHYDRAMINE HCL 25 MG PO CAPS
25.0000 mg | ORAL_CAPSULE | ORAL | Status: DC | PRN
  Administered 2016-02-10: 25 mg via ORAL
  Filled 2016-02-10: qty 1

## 2016-02-10 MED ORDER — PROMETHAZINE HCL 25 MG PO TABS
12.5000 mg | ORAL_TABLET | Freq: Once | ORAL | Status: AC
  Administered 2016-02-10: 12.5 mg via ORAL
  Filled 2016-02-10: qty 1

## 2016-02-10 MED ORDER — HYDROMORPHONE 1 MG/ML IV SOLN
INTRAVENOUS | Status: DC
  Administered 2016-02-10: 18 mg via INTRAVENOUS
  Administered 2016-02-10: 11:00:00 via INTRAVENOUS
  Filled 2016-02-10: qty 25

## 2016-02-10 NOTE — Telephone Encounter (Signed)
Called complaining of pain in legs, back and stomach. Pain intensity 8/10. Denies fever, chest pain, N/V or diarrhea. Took MS Contin and oxycodone at 5 am without relief. Requests to come in for treatment.

## 2016-02-10 NOTE — Progress Notes (Signed)
Requesting repeatedly for IV Benadryl. Telford NabL. [name] notified and I explained to patient she can have another po Benadryl but not IV. She declined the po Benadryl.

## 2016-02-10 NOTE — Progress Notes (Signed)
Positive attitude noted. Talking more. States "I feel so much better since I started taking the second antidepressant. My daughter even notices a difference. I feel good, I feel like getting out of bed now. I can't wait to tell Dr. Hyman HopesJegede".

## 2016-02-10 NOTE — Discharge Instructions (Signed)
Sickle Cell Anemia, Adult °Sickle cell anemia is a condition where your red blood cells are shaped like sickles. Red blood cells carry oxygen through the body. Sickle-shaped red blood cells do not live as long as normal red blood cells. They also clump together and block blood from flowing through the blood vessels. These things prevent the body from getting enough oxygen. Sickle cell anemia causes organ damage and pain. It also increases the risk of infection. °Follow these instructions at home: °· Drink enough fluid to keep your pee (urine) clear or pale yellow. Drink more in hot weather and during exercise. °· Do not smoke. Smoking lowers oxygen levels in the blood. °· Only take over-the-counter or prescription medicines as told by your doctor. °· Take antibiotic medicines as told by your doctor. Make sure you finish them even if you start to feel better. °· Take supplements as told by your doctor. °· Consider wearing a medical alert bracelet. This tells anyone caring for you in an emergency of your condition. °· When traveling, keep your medical information, doctors' names, and the medicines you take with you at all times. °· If you have a fever, do not take fever medicines right away. This could cover up a problem. Tell your doctor. °· Keep all follow-up visits with your doctor. Sickle cell anemia requires regular medical care. °Contact a doctor if: °You have a fever. °Get help right away if: °· You feel dizzy or faint. °· You have new belly (abdominal) pain, especially on the left side near the stomach area. °· You have a lasting, often uncomfortable and painful erection of the penis (priapism). If it is not treated right away, you will become unable to have sex (impotence). °· You have numbness in your arms or legs or you have a hard time moving them. °· You have a hard time talking. °· You have a fever or lasting symptoms for more than 2-3 days. °· You have a fever and your symptoms suddenly get  worse. °· You have signs or symptoms of infection. These include: °? Chills. °? Being more tired than normal (lethargy). °? Irritability. °? Poor eating. °? Throwing up (vomiting). °· You have pain that is not helped with medicine. °· You have shortness of breath. °· You have pain in your chest. °· You are coughing up pus-like or bloody mucus. °· You have a stiff neck. °· Your feet or hands swell or have pain. °· Your belly looks bloated. °· Your joints hurt. °This information is not intended to replace advice given to you by your health care provider. Make sure you discuss any questions you have with your health care provider. °Document Released: 12/07/2012 Document Revised: 07/25/2015 Document Reviewed: 09/28/2012 °Elsevier Interactive Patient Education © 2017 [employer] ° °

## 2016-02-10 NOTE — Discharge Summary (Signed)
[facility] Discharge Summary   Patient ID: Brittany Foster MRN: [id number] DOB/AGE: 31/03/1985 31 y.o.  Admit date: 02/10/2016 Discharge date: 02/10/2016  Primary Care Physician:  Jeanann LewandowskyJEGEDE, OLUGBEMIGA, MD  Admission Diagnoses:  Active Problems:   Sickle cell anemia with crisis [facility](HCC)   Discharge Medications:    Medication List    TAKE these medications   ARIPiprazole 5 MG tablet Commonly known as:  ABILIFY Take 1 tablet (5 mg total) by mouth 2 (two) times daily.   DULoxetine 60 MG capsule Commonly known as:  CYMBALTA TAKE 1 CAPSULE BY MOUTH DAILY.   folic acid 1 MG tablet Commonly known as:  FOLVITE Take 1 tablet (1 mg total) by mouth daily.   gabapentin 300 MG capsule Commonly known as:  NEURONTIN TAKE 1 CAPSULE BY MOUTH 3 TIMES DAILY.   hydroxyurea 500 MG capsule Commonly known as:  HYDREA TAKE 2 CAPSULES BY MOUTH DAILY. MAY TAKE WITH FOOD TO MINIMIZE GI SIDE EFFECTS.   ibuprofen 200 MG tablet Commonly known as:  ADVIL,MOTRIN Take 400 mg by mouth every 6 (six) hours as needed for moderate pain.   morphine 30 MG 12 hr tablet Commonly known as:  MS CONTIN TAKE ONE TABLET BY MOUTH EVERY TWELVE HOURS What changed:  See the new instructions.   oxyCODONE-acetaminophen 10-325 MG tablet Commonly known as:  PERCOCET TAKE ONE TABLET EVERY 4 HOURS AS NEEDED FOR pain What changed:  See the new instructions.   potassium chloride SA 20 MEQ tablet Commonly known as:  K-DUR,KLOR-CON Take 2 tablets (40 mEq total) by mouth daily.   Topiramate ER 100 MG Cp24 Take 100 mg by mouth at bedtime.        Consults:  None  Significant Diagnostic Studies:  Dg Chest 2 View  Result Date: 01/22/2016 CLINICAL DATA:  Shortness of breath. Arms, legs, and back hurt. History of sickle cell. Previous smoker. EXAM: CHEST  2 VIEW COMPARISON:  01/05/2016 FINDINGS: Right central venous catheter with tip over the low SVC region. No pneumothorax. Normal heart size and  pulmonary vascularity. No focal airspace disease or consolidation. No blunting of costophrenic angles. No pneumothorax. Surgical clips in the right upper quadrant. Old catheter fragment demonstrated in the upper mediastinum. This was present on previous CT from 10/19/2015. IMPRESSION: No active cardiopulmonary disease. Electronically Signed   By: Burman NievesWilliam  Stevens M.D.   On: 01/22/2016 22:27     [facility] Course:  Brittany CampbellMiranda Kundinger, a 30 year old female was admitted to the day infusion center for pain related to sickle cell anemia. Reviewed labs, stable. Patient was started on high concentration PCA due to opiate tolerance. She used a total of 17.5 mg with 35 demands and 35 deliveries. Pain intensity decreased from 9/10 to 6/10. Patient can manage at home on current medication regimen.  Patient is alert, oriented, and ambulatory. She will discharge home in stable condition.  Recommend that patient follow up in clinic as previously scheduled with Dr. Hyman HopesJegede. She expressed understanding.   The patient was given clear instructions to go to ER or return to medical center if symptoms do not improve, worsen or new problems develop. The patient verbalized understanding.   Physical Exam at Discharge:   BP (!) 92/58 (BP Location: Left Arm)   Pulse 95   Temp 98.4 F (36.9 C) (Oral)   Resp 12   Ht 5' (1.524 m)   Wt 123 lb (55.8 kg)   LMP 02/07/2016 (Exact Date)   SpO2 96%  BMI 24.02 kg/m   General Appearance:    Alert, cooperative, no distress, appears stated age  Head:    Normocephalic, without obvious abnormality, atraumatic  Eyes:    PERRL, conjunctiva/corneas clear, EOM's intact, fundi    benign, both eyes  Back:     Symmetric, no curvature, ROM normal, no CVA tenderness  Lungs:     Clear to auscultation bilaterally, respirations unlabored  Chest Wall:    No tenderness or deformity   Heart:    Regular rate and rhythm, S1 and S2 normal, no murmur, rub   or gallop   Extremities:   Extremities normal, atraumatic, no cyanosis or edema  Pulses:   2+ and symmetric all extremities  Skin:   Skin color, texture, turgor normal, no rashes or lesions    Disposition at Discharge: 01-Home or Self Care  Discharge Orders: Discharge Instructions    Discharge patient    Complete by:  As directed       Condition at Discharge:   Stable  Time spent on Discharge:  Greater than 30 minutes.  Signed: [name],Lachina M 02/10/2016, 4:52 PM

## 2016-02-10 NOTE — Telephone Encounter (Signed)
Notified L. [name] NP about patient's call this morning. Notified patient of [facility] and patient agrees.

## 2016-02-10 NOTE — H&P (Signed)
[facility] History and Physical   Date: 02/10/2016  Patient name: Brittany Foster Medical record number: [id number] Date of birth: 08/21/1985 Age: 30 y.o. Gender: female PCP: Jeanann LewandowskyJEGEDE, OLUGBEMIGA, MD  Attending physician: Quentin Angstlugbemiga E Jegede, MD  Chief Complaint: Generalized pain  History of Present Illness: [name], a 30 year old female with a history of sickle cell anemia, HbSS presents to the day hospital with generalized pain. Patient attributes her current pain crisis to changes in weather. Her current pain intensity is 9/10 described as constant and throbbing. She last had MS Contin and Oxycontin this am with minimal relief. She denies fever, headache, chest pain, SOB, dysuria, nausea, vomiting, or diarrhea.   Meds: Prescriptions Prior to Admission  Medication Sig Dispense Refill Last Dose  . ARIPiprazole (ABILIFY) 5 MG tablet Take 1 tablet (5 mg total) by mouth 2 (two) times daily. 60 tablet 3 01/30/2016 at Unknown time  . DULoxetine (CYMBALTA) 60 MG capsule TAKE 1 CAPSULE BY MOUTH DAILY. 30 capsule 3 01/30/2016 at Unknown time  . folic acid (FOLVITE) 1 MG tablet Take 1 tablet (1 mg total) by mouth daily. 90 tablet 3 01/29/2016 at Unknown time  . gabapentin (NEURONTIN) 300 MG capsule TAKE 1 CAPSULE BY MOUTH 3 TIMES DAILY. (Patient not taking: Reported on 01/30/2016) 90 capsule 3 Unknown at Unknown time  . hydroxyurea (HYDREA) 500 MG capsule TAKE 2 CAPSULES BY MOUTH DAILY. MAY TAKE WITH FOOD TO MINIMIZE GI SIDE EFFECTS. 60 capsule 3 01/30/2016 at Unknown time  . ibuprofen (ADVIL,MOTRIN) 200 MG tablet Take 400 mg by mouth every 6 (six) hours as needed for moderate pain.   Past Week at Unknown time  . morphine (MS CONTIN) 30 MG 12 hr tablet Take 1 tablet (30 mg total) by mouth every 12 (twelve) hours. 60 tablet 0 01/30/2016 at Unknown time  . oxyCODONE-acetaminophen (PERCOCET) 10-325 MG tablet Take 1 tablet by mouth every 4 (four) hours as needed for pain. 90 tablet  0 01/30/2016 at Unknown time  . potassium chloride SA (K-DUR,KLOR-CON) 20 MEQ tablet Take 2 tablets (40 mEq total) by mouth daily. (Patient not taking: Reported on 01/15/2016) 30 tablet 1 Unknown at Unknown time  . Topiramate ER 100 MG CP24 Take 100 mg by mouth at bedtime.   Past Week at Unknown time    Allergies: Ultram [tramadol]; Zofran [ondansetron hcl]; Buprenorphine hcl; Morphine and related; and Tape Past Medical History:  Diagnosis Date  . Anemia   . Depression, major, recurrent (HCC)   . Migraines   . Sickle cell anemia (HCC)    Past Surgical History:  Procedure Laterality Date  . CESAREAN SECTION    . CHOLECYSTECTOMY  2000  . IR GENERIC HISTORICAL  10/08/2015   IR US GUIDE VASC ACCESS RIGHT 10/08/2015 Simonne ComeJohn Watts, MD [facility]  . IR GENERIC HISTORICAL  10/08/2015   IR FLUORO GUIDE CV LINE RIGHT 10/08/2015 Simonne ComeJohn Watts, MD [facility]  . MULTIPLE TOOTH EXTRACTIONS N/A   . port a cath placement Right    about 6-7 years ago  . removal of porta cath Right 09/11/15  . TUBAL LIGATION     Family History  Problem Relation Age of Onset  . Sickle cell trait Father   . Sickle cell trait Mother   . Sickle cell anemia Other    Social History   Social History  . Marital status: Single    Spouse name: N/A  . Number of children: N/A  . Years of education: N/A  Occupational History  . Not on file.   Social History Main Topics  . Smoking status: Former Games developermoker  . Smokeless tobacco: Never Used  . Alcohol use No  . Drug use: No  . Sexual activity: Yes    Partners: Male    Birth control/ protection: Injection     Comment: 1 new partner recently, no concern about STI, uses condoms   Other Topics Concern  . Not on file   Social History Narrative  . No narrative on file    Review of Systems: Constitutional: negative for fatigue Eyes: negative Ears, nose, mouth, throat, and face: negative Respiratory: negative Cardiovascular: negative Gastrointestinal:  negative Genitourinary:negative Integument/breast: negative Hematologic/lymphatic: negative Musculoskeletal:positive for back pain and myalgias Neurological: negative Behavioral/Psych: negative Endocrine: negative Allergic/Immunologic: negative  Physical Exam: There were no vitals taken for this visit. BP (!) 92/58 (BP Location: Left Arm)   Pulse 95   Temp 98.4 F (36.9 C) (Oral)   Resp 12   Ht 5' (1.524 Foster)   Wt 123 lb (55.8 kg)   LMP 02/07/2016 (Exact Date)   SpO2 96%   BMI 24.02 kg/Foster   General Appearance:    Alert, cooperative, mild distress, appears stated age  Head:    Normocephalic, without obvious abnormality, atraumatic  Eyes:    PERRL, conjunctiva/corneas clear, EOM's intact, fundi    benign, both eyes  Ears:    Normal TM's and external ear canals, both ears  Nose:   Nares normal, septum midline, mucosa normal, no drainage    or sinus tenderness  Throat:   Lips, mucosa, and tongue normal; partially edentulous  Neck:   Supple, symmetrical, trachea midline, no adenopathy;    thyroid:  no enlargement/tenderness/nodules; no carotid   bruit or JVD  Back:     Symmetric, no curvature, ROM normal, no CVA tenderness  Lungs:     Clear to auscultation bilaterally, respirations unlabored  Chest Wall:    No tenderness or deformity   Heart:    Regular rate and rhythm, S1 and S2 normal, no murmur, rub   or gallop  Abdomen:     Soft, non-tender, bowel sounds active all four quadrants,    no masses, no organomegaly  Extremities:   Extremities normal, atraumatic, no cyanosis or edema  Pulses:   2+ and symmetric all extremities  Skin:   Skin color, texture, turgor normal, no rashes or lesions  Lymph nodes:   Cervical, supraclavicular, and axillary nodes normal  Neurologic:   CNII-XII intact, normal strength, sensation and reflexes    throughout     Lab results:No results found for this or any previous visit (from the past 24 hour(s)).  Imaging results:  No results  found.   Assessment & Plan:  Patient will be admitted to the day infusion center for extended observation  Start IV D5.45 for cellular rehydration at 125/hr  Start Toradol 30 mg IV every 6 hours for inflammation.  Start Dilaudid PCA High Concentration per weight based protocol.   Patient will be re-evaluated for pain intensity in the context of function and relationship to baseline as care progresses.  If no significant pain relief, will transfer patient to inpatient services for a higher level of care.   Will check CMP, UA, UDS,  CBC w/differential, and reticulocytes   Brittany Foster 02/10/2016, 9:38 AM

## 2016-02-10 NOTE — Progress Notes (Signed)
Patient ID: Brittany CampbellMiranda Geist, female   DOB: 09/25/1985, 30 y.o.   MRN: [id number] Ad[facility] for complaints of 9/10 generalized pain. Treated with IV fluids, IV Toradol and PCA Dilaudid. Pain level down to 6/10 at time of discharge. Went over discharge instructions and copy given to patient. Alert, oriented and ambulatory at time of discharge. Discharged to home with a "ride" picking her up.

## 2016-02-11 ENCOUNTER — Other Ambulatory Visit: Payer: Self-pay | Admitting: Internal Medicine

## 2016-02-11 ENCOUNTER — Ambulatory Visit: Payer: Self-pay | Admitting: Internal Medicine

## 2016-02-13 ENCOUNTER — Encounter (HOSPITAL_COMMUNITY): Payer: Self-pay | Admitting: Emergency Medicine

## 2016-02-13 ENCOUNTER — Inpatient Hospital Stay (HOSPITAL_COMMUNITY)
Admission: EM | Admit: 2016-02-13 | Discharge: 2016-02-17 | DRG: 812 | Disposition: A | Discharge status: Home / Self Care | Payer: Medicaid Other | Attending: Internal Medicine | Admitting: Internal Medicine

## 2016-02-13 ENCOUNTER — Telehealth (HOSPITAL_COMMUNITY): Payer: Self-pay | Admitting: *Deleted

## 2016-02-13 DIAGNOSIS — R269 Unspecified abnormalities of gait and mobility: Secondary | ICD-10-CM | POA: Diagnosis present

## 2016-02-13 DIAGNOSIS — K5900 Constipation, unspecified: Secondary | ICD-10-CM | POA: Diagnosis present

## 2016-02-13 DIAGNOSIS — N390 Urinary tract infection, site not specified: Secondary | ICD-10-CM | POA: Diagnosis present

## 2016-02-13 DIAGNOSIS — D638 Anemia in other chronic diseases classified elsewhere: Secondary | ICD-10-CM | POA: Diagnosis present

## 2016-02-13 DIAGNOSIS — G629 Polyneuropathy, unspecified: Secondary | ICD-10-CM | POA: Diagnosis present

## 2016-02-13 DIAGNOSIS — F332 Major depressive disorder, recurrent severe without psychotic features: Secondary | ICD-10-CM | POA: Diagnosis present

## 2016-02-13 DIAGNOSIS — Z79899 Other long term (current) drug therapy: Secondary | ICD-10-CM

## 2016-02-13 DIAGNOSIS — Z888 Allergy status to other drugs, medicaments and biological substances status: Secondary | ICD-10-CM

## 2016-02-13 DIAGNOSIS — D571 Sickle-cell disease without crisis: Principal | ICD-10-CM | POA: Diagnosis present

## 2016-02-13 DIAGNOSIS — D5700 Hb-SS disease with crisis, unspecified: Secondary | ICD-10-CM

## 2016-02-13 DIAGNOSIS — Z79891 Long term (current) use of opiate analgesic: Secondary | ICD-10-CM

## 2016-02-13 DIAGNOSIS — R262 Difficulty in walking, not elsewhere classified: Secondary | ICD-10-CM

## 2016-02-13 DIAGNOSIS — G43909 Migraine, unspecified, not intractable, without status migrainosus: Secondary | ICD-10-CM | POA: Diagnosis not present

## 2016-02-13 DIAGNOSIS — Z87891 Personal history of nicotine dependence: Secondary | ICD-10-CM

## 2016-02-13 DIAGNOSIS — Z9049 Acquired absence of other specified parts of digestive tract: Secondary | ICD-10-CM

## 2016-02-13 DIAGNOSIS — Z9109 Other allergy status, other than to drugs and biological substances: Secondary | ICD-10-CM

## 2016-02-13 DIAGNOSIS — D72829 Elevated white blood cell count, unspecified: Secondary | ICD-10-CM | POA: Diagnosis present

## 2016-02-13 DIAGNOSIS — Z885 Allergy status to narcotic agent status: Secondary | ICD-10-CM

## 2016-02-13 DIAGNOSIS — G8929 Other chronic pain: Secondary | ICD-10-CM | POA: Diagnosis present

## 2016-02-13 LAB — CBC WITH DIFFERENTIAL/PLATELET
Basophils Absolute: 0.1 10*3/uL (ref 0.0–0.1)
Basophils Relative: 0 %
Eosinophils Absolute: 0.1 10*3/uL (ref 0.0–0.7)
Eosinophils Relative: 1 %
HCT: 22.5 % — ABNORMAL LOW (ref 36.0–46.0)
Hemoglobin: 8 g/dL — ABNORMAL LOW (ref 12.0–15.0)
Lymphocytes Relative: 33 %
Lymphs Abs: 3.9 10*3/uL (ref 0.7–4.0)
MCH: 35.2 pg — ABNORMAL HIGH (ref 26.0–34.0)
MCHC: 35.6 g/dL (ref 30.0–36.0)
MCV: 99.1 fL (ref 78.0–100.0)
Monocytes Absolute: 1 10*3/uL (ref 0.1–1.0)
Monocytes Relative: 8 %
Neutro Abs: 7 10*3/uL (ref 1.7–7.7)
Neutrophils Relative %: 58 %
Platelets: 306 10*3/uL (ref 150–400)
RBC: 2.27 MIL/uL — ABNORMAL LOW (ref 3.87–5.11)
RDW: 21.9 % — ABNORMAL HIGH (ref 11.5–15.5)
WBC: 12.1 10*3/uL — ABNORMAL HIGH (ref 4.0–10.5)

## 2016-02-13 LAB — COMPREHENSIVE METABOLIC PANEL
ALT: 25 U/L (ref 14–54)
AST: 49 U/L — ABNORMAL HIGH (ref 15–41)
Albumin: 3.8 g/dL (ref 3.5–5.0)
Alkaline Phosphatase: 81 U/L (ref 38–126)
Anion gap: 5 (ref 5–15)
BUN: 6 mg/dL (ref 6–20)
CO2: 25 mmol/L (ref 22–32)
Calcium: 8.6 mg/dL — ABNORMAL LOW (ref 8.9–10.3)
Chloride: 109 mmol/L (ref 101–111)
Creatinine, Ser: 0.37 mg/dL — ABNORMAL LOW (ref 0.44–1.00)
GFR calc Af Amer: >60 mL/min (ref >60)
GFR calc non Af Amer: >60 mL/min (ref >60)
Glucose, Bld: 98 mg/dL (ref 65–99)
Potassium: 3.5 mmol/L (ref 3.5–5.1)
Sodium: 139 mmol/L (ref 135–145)
Total Bilirubin: 1.7 mg/dL — ABNORMAL HIGH (ref 0.3–1.2)
Total Protein: 6.8 g/dL (ref 6.5–8.1)

## 2016-02-13 LAB — RETICULOCYTES
RBC.: 2.27 MIL/uL — ABNORMAL LOW (ref 3.87–5.11)
Retic Count, Absolute: 272.4 10*3/uL — ABNORMAL HIGH (ref 19.0–186.0)
Retic Ct Pct: 12 % — ABNORMAL HIGH (ref 0.4–3.1)

## 2016-02-13 MED ORDER — HYDROMORPHONE HCL 1 MG/ML IJ SOLN
1.0000 mg | INTRAMUSCULAR | Status: DC | PRN
  Administered 2016-02-13 (×2): 1 mg via INTRAVENOUS
  Filled 2016-02-13: qty 1

## 2016-02-13 MED ORDER — DEXTROSE-NACL 5-0.45 % IV SOLN
INTRAVENOUS | Status: DC
  Administered 2016-02-13 – 2016-02-14 (×2): via INTRAVENOUS
  Administered 2016-02-14: 1000 mL via INTRAVENOUS
  Administered 2016-02-15: 22:00:00 via INTRAVENOUS

## 2016-02-13 MED ORDER — HYDROMORPHONE HCL 2 MG/ML IJ SOLN
2.0000 mg | INTRAMUSCULAR | Status: AC | PRN
  Administered 2016-02-13: 2 mg via INTRAVENOUS
  Filled 2016-02-13: qty 1

## 2016-02-13 MED ORDER — DIPHENHYDRAMINE HCL 50 MG/ML IJ SOLN
25.0000 mg | INTRAMUSCULAR | Status: AC | PRN
  Administered 2016-02-13 (×2): 25 mg via INTRAVENOUS
  Filled 2016-02-13 (×2): qty 1

## 2016-02-13 MED ORDER — HYDROMORPHONE HCL 2 MG/ML IJ SOLN
2.0000 mg | Freq: Once | INTRAMUSCULAR | Status: AC
  Administered 2016-02-13: 2 mg via INTRAVENOUS
  Filled 2016-02-13: qty 1

## 2016-02-13 MED ORDER — HYDROMORPHONE HCL 1 MG/ML IJ SOLN: 0.5000 mg | Freq: Once | INTRAMUSCULAR | Status: DC

## 2016-02-13 MED ORDER — KETOROLAC TROMETHAMINE 15 MG/ML IJ SOLN
15.0000 mg | Freq: Once | INTRAMUSCULAR | Status: AC
  Administered 2016-02-13: 15 mg via INTRAVENOUS
  Filled 2016-02-13: qty 1

## 2016-02-13 NOTE — ED Notes (Signed)
hospitalist at bedside

## 2016-02-13 NOTE — ED Triage Notes (Signed)
Pt c/o progressive sickle cell crisis pain to legs, stomach, back, nausea. Pt reports symptoms feel similar to typical sickle cell crisis pain. No emesis, diarrhea.

## 2016-02-13 NOTE — Telephone Encounter (Signed)
After speaking with the provider, called patient back and told her that she needed to stay home and continue to take her pain meds around the clock and hydrate. Pt voiced understanding.

## 2016-02-13 NOTE — H&P (Addendum)
History and Physical    Brittany Foster HYQ:[id number] DOB: 1985-11-16 DOA: 02/13/2016  Referring MD/NP/PA:   PCP: Jeanann Lewandowsky, MD   Patient coming from:  The patient is coming from home.  At baseline, pt is independent for most of ADL.   Chief Complaint: Pain all over  HPI: Brittany Foster is a 30 y.o. female with medical history significant of sickle cell anemia, migraine headaches, depression, who presents with pain all over.  Pt states that she has worsening pain in her legs and back today. She also has mild pain in abdomen and chest.The pain in legs and back is constant, 10 out of 10 in severity, nonradiating. It is not aggravated or alleviated by any known factors. Patient states that her pain is typical for her past sickle cell pain flare up. She has mild shortness of breath, but no tenderness over calf areas. Pt states that she took her home pain medications without help. She has nausea, but no diarrhea. No cough, fever, chills, symptoms for UTI or unilateral weakness.  ED Course: pt was found to have WBC 12.1, electrolytes and renal function okay, temperature normal, tachycardia, oxygen saturation 100% on room air. Patient is placed MedSurg bed for observation.  Review of Systems:   General: no fevers, chills, no changes in body weight, has poor appetite, has fatigue HEENT: no blurry vision, hearing changes or sore throat Respiratory: mild dyspnea, no coughing, wheezing CV: has chest pain, no palpitations GI: has nausea, no vomiting, diarrhea, constipation GU: no dysuria, burning on urination, increased urinary frequency, hematuria  Ext: no leg edema Neuro: no unilateral weakness, numbness, or tingling, no vision change or hearing loss Skin: no rash, no skin tear. MSK: has pain in both legs and back Heme: No easy bruising.  Travel history: No recent long distant travel.  Allergy:  Allergies  Allergen Reactions  . Ultram [Tramadol] Other (See Comments)    seizures  .  Zofran [Ondansetron Hcl] Nausea And Vomiting  . Buprenorphine Hcl Hives and Rash    Shaking Tolerates Percocet, Norco, and buprenorphine  . Morphine And Related Hives, Rash and Other (See Comments)    Shaking Tolerates Percocet, Norco, Dilaudid, and buprenorphine  . Tape Rash    Past Medical History:  Diagnosis Date  . Anemia   . Depression, major, recurrent (HCC)   . Migraines   . Sickle cell anemia (HCC)     Past Surgical History:  Procedure Laterality Date  . CESAREAN SECTION    . CHOLECYSTECTOMY  2000  . IR GENERIC HISTORICAL  10/08/2015   IR US GUIDE VASC ACCESS RIGHT 10/08/2015 Simonne Come, MD [facility]  . IR GENERIC HISTORICAL  10/08/2015   IR FLUORO GUIDE CV LINE RIGHT 10/08/2015 Simonne Come, MD [facility]  . MULTIPLE TOOTH EXTRACTIONS N/A   . port a cath placement Right    about 6-7 years ago  . removal of porta cath Right 09/11/15  . TUBAL LIGATION      Social History:  reports that she has quit smoking. She has never used smokeless tobacco. She reports that she does not drink alcohol or use drugs.  Family History:  Family History  Problem Relation Age of Onset  . Sickle cell trait Father   . Sickle cell trait Mother   . Sickle cell anemia Other      Prior to Admission medications   Medication Sig Start Date End Date Taking? Authorizing Provider  ARIPiprazole (ABILIFY) 5 MG tablet Take 1 tablet (5 mg  total) by mouth 2 (two) times daily. 01/12/16  Yes Olugbemiga Annitta NeedsE Jegede, MD  DULoxetine (CYMBALTA) 60 MG capsule TAKE 1 CAPSULE BY MOUTH DAILY. 11/27/15  Yes Quentin Angstlugbemiga E Jegede, MD  folic acid (FOLVITE) 1 MG tablet Take 1 tablet (1 mg total) by mouth daily. 01/17/16  Yes Quentin Angstlugbemiga E Jegede, MD  hydroxyurea (HYDREA) 500 MG capsule TAKE 2 CAPSULES BY MOUTH DAILY. MAY TAKE WITH FOOD TO MINIMIZE GI SIDE EFFECTS. 08/08/15  Yes Massie MaroonLachina M [name], FNP  ibuprofen (ADVIL,MOTRIN) 200 MG tablet Take 400 mg by mouth every 6 (six) hours as needed for moderate pain.   Yes  Historical Provider, MD  morphine (MS CONTIN) 30 MG 12 hr tablet TAKE ONE TABLET BY MOUTH EVERY TWELVE HOURS 02/10/16  Yes Quentin Angstlugbemiga E Jegede, MD  oxyCODONE-acetaminophen (PERCOCET) 10-325 MG tablet TAKE ONE TABLET EVERY 4 HOURS AS NEEDED FOR pain 02/10/16  Yes Quentin Angstlugbemiga E Jegede, MD  Topiramate ER 100 MG CP24 Take 100 mg by mouth at bedtime.   Yes Historical Provider, MD  gabapentin (NEURONTIN) 300 MG capsule TAKE 1 CAPSULE BY MOUTH 3 TIMES DAILY. Patient not taking: Reported on 02/13/2016 11/27/15   Quentin Angstlugbemiga E Jegede, MD  potassium chloride SA (K-DUR,KLOR-CON) 20 MEQ tablet Take 2 tablets (40 mEq total) by mouth daily. Patient not taking: Reported on 02/13/2016 11/23/15   Altha HarmMichelle A [name], MD    Physical Exam: Vitals:   02/13/16 1847 02/13/16 2118  BP: 126/81 122/76  Pulse: 112 108  Resp: 16 18  Temp: 98.7 F (37.1 C)   TempSrc: Oral   SpO2: 100% 100%   General: Not in acute distress HEENT:       Eyes: PERRL, EOMI, no scleral icterus.       ENT: No discharge from the ears and nose, no pharynx injection, no tonsillar enlargement.        Neck: No JVD, no bruit, no mass felt. Heme: No neck lymph node enlargement. Cardiac: S1/S2, RRR, No murmurs, No gallops or rubs. Respiratory: No rales, wheezing, rhonchi or rubs. GI: Soft, nondistended, nontender, no rebound pain, no organomegaly, BS present. GU: No hematuria Ext: No pitting leg edema bilaterally. 2+DP/PT pulse bilaterally. Musculoskeletal: No joint deformities, No joint redness or warmth, no limitation of ROM in spin. Has tenderness in legs and back. Skin: No rashes.  Neuro: Alert, oriented X3, cranial nerves II-XII grossly intact, moves all extremities normally.  Psych: Patient is not psychotic, no suicidal or hemocidal ideation.  Labs on Admission: I have personally reviewed following labs and imaging studies  CBC:  Recent Labs Lab 02/10/16 0947 02/13/16 1944  WBC 10.8* 12.1*  NEUTROABS 7.9* 7.0  HGB 9.4* 8.0*    HCT 27.2* 22.5*  MCV 98.6 99.1  PLT 301 306   Basic Metabolic Panel:  Recent Labs Lab 02/10/16 0947 02/13/16 1944  NA 138 139  K 3.6 3.5  CL 109 109  CO2 21* 25  GLUCOSE 104* 98  BUN 14 6  CREATININE 0.67 0.37*  CALCIUM 9.1 8.6*   GFR: Estimated Creatinine Clearance: 79.8 mL/min (by C-G formula based on SCr of 0.37 mg/dL (L)). Liver Function Tests:  Recent Labs Lab 02/10/16 0947 02/13/16 1944  AST 44* 49*  ALT 25 25  ALKPHOS 96 81  BILITOT 2.6* 1.7*  PROT 7.7 6.8  ALBUMIN 4.6 3.8   No results for input(s): LIPASE, AMYLASE in the last 168 hours. No results for input(s): AMMONIA in the last 168 hours. Coagulation Profile: No results for input(s): INR, PROTIME  in the last 168 hours. Cardiac Enzymes: No results for input(s): CKTOTAL, CKMB, CKMBINDEX, TROPONINI in the last 168 hours. BNP (last 3 results) No results for input(s): PROBNP in the last 8760 hours. HbA1C: No results for input(s): HGBA1C in the last 72 hours. CBG: No results for input(s): GLUCAP in the last 168 hours. Lipid Profile: No results for input(s): CHOL, HDL, LDLCALC, TRIG, CHOLHDL, LDLDIRECT in the last 72 hours. Thyroid Function Tests: No results for input(s): TSH, T4TOTAL, FREET4, T3FREE, THYROIDAB in the last 72 hours. Anemia Panel:  Recent Labs  02/13/16 1944  RETICCTPCT 12.0*   Urine analysis:    Component Value Date/Time   COLORURINE AMBER (A) 02/10/2016 0958   APPEARANCEUR HAZY (A) 02/10/2016 0958   LABSPEC 1.016 02/10/2016 0958   PHURINE 5.0 02/10/2016 0958   GLUCOSEU NEGATIVE 02/10/2016 0958   HGBUR LARGE (A) 02/10/2016 0958   BILIRUBINUR NEGATIVE 02/10/2016 0958   KETONESUR NEGATIVE 02/10/2016 0958   PROTEINUR NEGATIVE 02/10/2016 0958   UROBILINOGEN 1.0 11/13/2014 1338   NITRITE NEGATIVE 02/10/2016 0958   LEUKOCYTESUR MODERATE (A) 02/10/2016 0958   Sepsis Labs: @LABRCNTIP (procalcitonin:4,lacticidven:4) )No results found for this or any previous visit (from the past  240 hour(s)).   Radiological Exams on Admission: No results found.   EKG: Not done in ED, will get one.   Assessment/Plan Principal Problem:   Sickle cell pain crisis (HCC) Active Problems:   MDD (major depressive disorder), recurrent severe, without psychosis (HCC)   Migraine without status migrainosus, not intractable   Sickle cell anemia with crisis: Hemoglobin stable, Hgb is 8.0. No signs of infection. Patient has mild chest pain and mild shortness of breath, but no signs of DVT or oxygen desaturation, low suspicion for PE. Low Suspicion for acute chest syndrome.  - will place on Med-surg bed for obs - start PCA protocol - IV hydration 125 ml/hr D5.45 NS - Toradol 30 mg IV every 6 hours. - continue home MS contin - continue home hydroxyurea and folate  Leukocytosis: no signs of infection. Likely due to stress induced to demargination. -follow up by CBC  Depression:  Stable, no suicidal or homicidal ideations. -Continue home medications: Cymbalta  Migraine HA: no HA now -continue Topamax  DVT ppx: SQ Lovenox Code Status: Full code Family Communication: None at bed side.  Disposition Plan:  Anticipate discharge back to previous home environment Consults called: none  Admission status: medical floor/obs       Date of Service 02/14/2016    Lorretta HarpIU, Yumiko Alkins [facility] Pager [phone]  If 7PM-7AM, please contact night-coverage [email or web address] Password [email or web address] 02/14/2016, 12:19 AM

## 2016-02-13 NOTE — ED Notes (Addendum)
Pt CVC verified on 11/22 by XR. MD authorized use during this visit.

## 2016-02-13 NOTE — ED Notes (Signed)
EKG given to EDP,Kohut,MD., for review. 

## 2016-02-13 NOTE — ED Provider Notes (Signed)
[facility]-EMERGENCY DEPT Provider Note   CSN: [id number] Arrival date & time: 02/13/16  1832  By signing my name below, I, Sonum Patel, attest that this documentation has been prepared under the direction and in the presence of Raeford Razor, MD. Electronically Signed: Sonum Patel, Neurosurgeon. 02/13/16. 7:49 PM.  History   Chief Complaint Chief Complaint  Patient presents with  . Sickle Cell Pain Crisis    The history is provided by the patient. No language interpreter was used.     HPI Comments: Brittany Foster is a 30 y.o. female who presents to the Emergency Department complaining of sickle cell crisis pain to bilateral legs, abdomen, and back that began today. She states this is the typical pain that occurs with crises. She states she usually takes oxycodone 10-325 and MS Contin which has not provider her relief today. She also complains of associated CP and SOB. She denies joint swelling, rash, cough, fever.   [facility]  Past Medical History:  Diagnosis Date  . Anemia   . Depression, major, recurrent (HCC)   . Migraines   . Sickle cell anemia [facility])     Patient Active Problem List   Diagnosis Date Noted  . Sickle cell anemia with crisis (HCC) 01/30/2016  . Hypotension 01/26/2016  . Opiate dependence (HCC) 01/23/2016  . MDD (major depressive disorder), recurrent severe, without psychosis (HCC) 01/10/2016  . Vitamin D deficiency 01/08/2015  . Chronic pain syndrome 01/08/2015  . 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 Anemia 02/09/2014  . Neuropathy (HCC) 02/09/2014  . Chronic headache disorder 02/09/2014    Past Surgical History:  Procedure Laterality Date  . CESAREAN SECTION    . CHOLECYSTECTOMY  2000  . IR GENERIC HISTORICAL  10/08/2015   IR US GUIDE VASC ACCESS RIGHT 10/08/2015 Simonne Come, MD [facility]  . IR GENERIC HISTORICAL  10/08/2015   IR FLUORO GUIDE CV LINE RIGHT 10/08/2015 Simonne Come, MD [facility]  . MULTIPLE TOOTH EXTRACTIONS N/A   .  port a cath placement Right    about 6-7 years ago  . removal of porta cath Right 09/11/15  . TUBAL LIGATION      OB History    No data available       Home Medications    Prior to Admission medications   Medication Sig Start Date End Date Taking? Authorizing Provider  ARIPiprazole (ABILIFY) 5 MG tablet Take 1 tablet (5 mg total) by mouth 2 (two) times daily. 01/12/16   Quentin Angst, MD  DULoxetine (CYMBALTA) 60 MG capsule TAKE 1 CAPSULE BY MOUTH DAILY. 11/27/15   Quentin Angst, MD  folic acid (FOLVITE) 1 MG tablet Take 1 tablet (1 mg total) by mouth daily. 01/17/16   Quentin Angst, MD  gabapentin (NEURONTIN) 300 MG capsule TAKE 1 CAPSULE BY MOUTH 3 TIMES DAILY. Patient not taking: Reported on 01/30/2016 11/27/15   Quentin Angst, MD  hydroxyurea (HYDREA) 500 MG capsule TAKE 2 CAPSULES BY MOUTH DAILY. MAY TAKE WITH FOOD TO MINIMIZE GI SIDE EFFECTS. 08/08/15   [name], FNP  ibuprofen (ADVIL,MOTRIN) 200 MG tablet Take 400 mg by mouth every 6 (six) hours as needed for moderate pain.    Historical Provider, MD  morphine (MS CONTIN) 30 MG 12 hr tablet TAKE ONE TABLET BY MOUTH EVERY TWELVE HOURS 02/10/16   Quentin Angst, MD  oxyCODONE-acetaminophen (PERCOCET) 10-325 MG tablet TAKE ONE TABLET EVERY 4 HOURS AS NEEDED FOR pain 02/10/16  Quentin Angstlugbemiga E Jegede, MD  potassium chloride SA (K-DUR,KLOR-CON) 20 MEQ tablet Take 2 tablets (40 mEq total) by mouth daily. Patient not taking: Reported on 01/15/2016 11/23/15   Altha HarmMichelle A [name], MD  Topiramate ER 100 MG CP24 Take 100 mg by mouth at bedtime.    Historical Provider, MD    Family History Family History  Problem Relation Age of Onset  . Sickle cell trait Father   . Sickle cell trait Mother   . Sickle cell anemia Other     Social History Social History  Substance Use Topics  . Smoking status: Former Games developermoker  . Smokeless tobacco: Never Used  . Alcohol use No     Allergies   Ultram [tramadol];  Zofran [ondansetron hcl]; Buprenorphine hcl; Morphine and related; and Tape   Review of Systems Review of Systems  A complete 10 system review of systems was obtained and all systems are negative except as noted in the HPI and PMH.   Physical Exam Updated Vital Signs BP 126/81 (BP Location: Right Arm)   Pulse 112   Temp 98.7 F (37.1 C) (Oral)   Resp 16   LMP 02/07/2016 (Exact Date)   SpO2 100%   Physical Exam  Constitutional: She is oriented to person, place, and time. She appears well-developed and well-nourished. No distress.  HENT:  Head: Normocephalic and atraumatic.  Eyes: EOM are normal.  Neck: Normal range of motion.  Cardiovascular: Normal rate, regular rhythm and normal heart sounds.   Pulmonary/Chest: Effort normal and breath sounds normal.  Catheter to right chest  Abdominal: Soft. She exhibits no distension. There is no tenderness.  Musculoskeletal: Normal range of motion.  Neurological: She is alert and oriented to person, place, and time.  Skin: Skin is warm and dry.  Psychiatric: She has a normal mood and affect. Judgment normal.  Nursing note and vitals reviewed.    ED Treatments / Results  DIAGNOSTIC STUDIES: Oxygen Saturation is 100% on RA, normal by my interpretation.    COORDINATION OF CARE: 7:48 PM Discussed treatment plan with pt at bedside and pt agreed to plan.   Labs (all labs ordered are listed, but only abnormal results are displayed) Labs Reviewed  URINE CULTURE - Abnormal; Notable for the following:       Result Value   Culture >=100,000 COLONIES/mL GRAM NEGATIVE RODS (*)    All other components within normal limits  COMPREHENSIVE METABOLIC PANEL - Abnormal; Notable for the following:    Creatinine, Ser 0.37 (*)    Calcium 8.6 (*)    AST 49 (*)    Total Bilirubin 1.7 (*)    All other components within normal limits  CBC WITH DIFFERENTIAL/PLATELET - Abnormal; Notable for the following:    WBC 12.1 (*)    RBC 2.27 (*)     Hemoglobin 8.0 (*)    HCT 22.5 (*)    MCH 35.2 (*)    RDW 21.9 (*)    All other components within normal limits  RETICULOCYTES - Abnormal; Notable for the following:    Retic Ct Pct 12.0 (*)    RBC. 2.27 (*)    Retic Count, Manual 272.4 (*)    All other components within normal limits  CBC WITH DIFFERENTIAL/PLATELET - Abnormal; Notable for the following:    WBC 11.8 (*)    RBC 2.05 (*)    Hemoglobin 7.2 (*)    HCT 20.8 (*)    MCV 101.5 (*)    MCH 35.1 (*)  RDW 22.7 (*)    All other components within normal limits  RETICULOCYTES - Abnormal; Notable for the following:    Retic Ct Pct 12.2 (*)    RBC. 2.05 (*)    Retic Count, Manual 250.1 (*)    All other components within normal limits  URINALYSIS, ROUTINE W REFLEX MICROSCOPIC - Abnormal; Notable for the following:    APPearance HAZY (*)    Hgb urine dipstick SMALL (*)    Leukocytes, UA MODERATE (*)    Bacteria, UA MANY (*)    Squamous Epithelial / LPF 6-30 (*)    All other components within normal limits  HCG, QUANTITATIVE, PREGNANCY    EKG  EKG Interpretation None       Radiology No results found.  Procedures Procedures (including critical care time)  Medications Ordered in ED Medications - No data to display   Initial Impression / Assessment and Plan / ED Course  I have reviewed the triage vital signs and the nursing notes.  Pertinent labs & imaging results that were available during my care of the patient were reviewed by me and considered in my medical decision making (see chart for details).  Clinical Course     30 year old female with pain which she attributes to sickle cell pain crisis. I doubt an acute emergent complication of sickle cell anemia or otherwise. Had difficulty controlling her symptoms adequately though. Will admit for ongoing pain control.  Final Clinical Impressions(s) / ED Diagnoses   Final diagnoses:  Sickle cell pain crisis (HCC)    New Prescriptions New Prescriptions   No  medications on file    I personally preformed the services scribed in my presence. The recorded information has been reviewed is accurate. Raeford RazorStephen Chelsee Hosie, MD.    Raeford RazorStephen Sadarius Norman, MD 02/19/16 [phone]

## 2016-02-13 NOTE — Telephone Encounter (Signed)
[facility]. Pt stated her pain is 9/10 and that it is located in her legs, back and top of stomach. Stated she last took her Oxycodone and MS contin this morning at 6am.  She stated she had taken her meds around the clock. She denies fever, abd pain, nausea or vomiting or diarrhea. Will check with the provider and give her a call back. Pt voiced understanding.

## 2016-02-14 DIAGNOSIS — Z9109 Other allergy status, other than to drugs and biological substances: Secondary | ICD-10-CM | POA: Diagnosis not present

## 2016-02-14 DIAGNOSIS — Z9049 Acquired absence of other specified parts of digestive tract: Secondary | ICD-10-CM | POA: Diagnosis not present

## 2016-02-14 DIAGNOSIS — Z79891 Long term (current) use of opiate analgesic: Secondary | ICD-10-CM | POA: Diagnosis not present

## 2016-02-14 DIAGNOSIS — M25559 Pain in unspecified hip: Secondary | ICD-10-CM | POA: Diagnosis present

## 2016-02-14 DIAGNOSIS — K5903 Drug induced constipation: Secondary | ICD-10-CM | POA: Diagnosis not present

## 2016-02-14 DIAGNOSIS — D5700 Hb-SS disease with crisis, unspecified: Secondary | ICD-10-CM | POA: Diagnosis present

## 2016-02-14 DIAGNOSIS — Z885 Allergy status to narcotic agent status: Secondary | ICD-10-CM | POA: Diagnosis not present

## 2016-02-14 DIAGNOSIS — Z87891 Personal history of nicotine dependence: Secondary | ICD-10-CM | POA: Diagnosis not present

## 2016-02-14 DIAGNOSIS — K5900 Constipation, unspecified: Secondary | ICD-10-CM | POA: Diagnosis present

## 2016-02-14 DIAGNOSIS — R269 Unspecified abnormalities of gait and mobility: Secondary | ICD-10-CM | POA: Diagnosis present

## 2016-02-14 DIAGNOSIS — G43909 Migraine, unspecified, not intractable, without status migrainosus: Secondary | ICD-10-CM | POA: Diagnosis present

## 2016-02-14 DIAGNOSIS — G8918 Other acute postprocedural pain: Secondary | ICD-10-CM | POA: Diagnosis not present

## 2016-02-14 DIAGNOSIS — D571 Sickle-cell disease without crisis: Secondary | ICD-10-CM | POA: Diagnosis not present

## 2016-02-14 DIAGNOSIS — Z79899 Other long term (current) drug therapy: Secondary | ICD-10-CM | POA: Diagnosis not present

## 2016-02-14 DIAGNOSIS — G8929 Other chronic pain: Secondary | ICD-10-CM | POA: Diagnosis present

## 2016-02-14 DIAGNOSIS — D72829 Elevated white blood cell count, unspecified: Secondary | ICD-10-CM | POA: Diagnosis present

## 2016-02-14 DIAGNOSIS — N390 Urinary tract infection, site not specified: Secondary | ICD-10-CM | POA: Diagnosis present

## 2016-02-14 DIAGNOSIS — F332 Major depressive disorder, recurrent severe without psychotic features: Secondary | ICD-10-CM

## 2016-02-14 DIAGNOSIS — Z888 Allergy status to other drugs, medicaments and biological substances status: Secondary | ICD-10-CM | POA: Diagnosis not present

## 2016-02-14 DIAGNOSIS — M549 Dorsalgia, unspecified: Secondary | ICD-10-CM | POA: Diagnosis not present

## 2016-02-14 DIAGNOSIS — D638 Anemia in other chronic diseases classified elsewhere: Secondary | ICD-10-CM | POA: Diagnosis present

## 2016-02-14 LAB — HCG, QUANTITATIVE, PREGNANCY: hCG, Beta Chain, Quant, S: <1 m[IU]/mL (ref <5)

## 2016-02-14 MED ORDER — SENNOSIDES-DOCUSATE SODIUM 8.6-50 MG PO TABS
1.0000 | ORAL_TABLET | Freq: Two times a day (BID) | ORAL | Status: DC
  Administered 2016-02-14 – 2016-02-17 (×2): 1 via ORAL
  Filled 2016-02-14 (×6): qty 1

## 2016-02-14 MED ORDER — OXYCODONE HCL 5 MG PO TABS
15.0000 mg | ORAL_TABLET | ORAL | Status: DC
  Administered 2016-02-14 – 2016-02-17 (×18): 15 mg via ORAL
  Filled 2016-02-14 (×18): qty 3

## 2016-02-14 MED ORDER — HYDROMORPHONE 1 MG/ML IV SOLN
INTRAVENOUS | Status: DC
  Administered 2016-02-14 (×2): 2 mg via INTRAVENOUS
  Administered 2016-02-14: 10 mg via INTRAVENOUS
  Administered 2016-02-14: 9.37 mg via INTRAVENOUS
  Administered 2016-02-15: 2 mg via INTRAVENOUS
  Administered 2016-02-15: 9.8 mg via INTRAVENOUS
  Administered 2016-02-15 (×2): 2 mg via INTRAVENOUS
  Administered 2016-02-15: 14:00:00 via INTRAVENOUS
  Administered 2016-02-15 (×2): 2 mg via INTRAVENOUS
  Administered 2016-02-15: 10.6 mg via INTRAVENOUS
  Administered 2016-02-15 (×3): 2 mg via INTRAVENOUS
  Administered 2016-02-15: 11.8 mg via INTRAVENOUS
  Administered 2016-02-15: 7 mg via INTRAVENOUS
  Administered 2016-02-15 (×2): 2 mg via INTRAVENOUS
  Administered 2016-02-15 (×2): via INTRAVENOUS
  Administered 2016-02-16 (×2): 2 mg via INTRAVENOUS
  Administered 2016-02-16: 22.8 mg via INTRAVENOUS
  Administered 2016-02-16: 2 mg via INTRAVENOUS
  Administered 2016-02-16: 4.4 mg via INTRAVENOUS
  Administered 2016-02-16: 11:00:00 via INTRAVENOUS
  Administered 2016-02-16 (×5): 2 mg via INTRAVENOUS
  Filled 2016-02-14 (×4): qty 25

## 2016-02-14 MED ORDER — HYDROMORPHONE HCL 2 MG/ML IJ SOLN
2.0000 mg | INTRAMUSCULAR | Status: AC | PRN
  Administered 2016-02-14: 2 mg via INTRAVENOUS
  Filled 2016-02-14: qty 1

## 2016-02-14 MED ORDER — MORPHINE SULFATE ER 30 MG PO TBCR
30.0000 mg | EXTENDED_RELEASE_TABLET | Freq: Two times a day (BID) | ORAL | Status: DC
  Administered 2016-02-14 – 2016-02-17 (×8): 30 mg via ORAL
  Filled 2016-02-14 (×8): qty 1

## 2016-02-14 MED ORDER — TOPIRAMATE 25 MG PO TABS
50.0000 mg | ORAL_TABLET | Freq: Two times a day (BID) | ORAL | Status: DC
  Administered 2016-02-14 – 2016-02-17 (×8): 50 mg via ORAL
  Filled 2016-02-14 (×8): qty 2

## 2016-02-14 MED ORDER — PROMETHAZINE HCL 12.5 MG RE SUPP
12.5000 mg | RECTAL | Status: DC | PRN
  Filled 2016-02-14: qty 2

## 2016-02-14 MED ORDER — NALOXONE HCL 0.4 MG/ML IJ SOLN: 0.4000 mg | INTRAMUSCULAR | Status: DC | PRN

## 2016-02-14 MED ORDER — HYDROMORPHONE 1 MG/ML IV SOLN
INTRAVENOUS | Status: DC
  Administered 2016-02-14: 03:00:00 via INTRAVENOUS
  Administered 2016-02-14: 9 mg via INTRAVENOUS
  Administered 2016-02-14: 11 mg via INTRAVENOUS
  Administered 2016-02-14: 13:00:00 via INTRAVENOUS
  Filled 2016-02-14 (×2): qty 25

## 2016-02-14 MED ORDER — KETOROLAC TROMETHAMINE 15 MG/ML IJ SOLN
30.0000 mg | Freq: Four times a day (QID) | INTRAMUSCULAR | Status: AC
  Administered 2016-02-14 – 2016-02-15 (×8): 30 mg via INTRAVENOUS
  Filled 2016-02-14 (×8): qty 2

## 2016-02-14 MED ORDER — FOLIC ACID 1 MG PO TABS
1.0000 mg | ORAL_TABLET | Freq: Every day | ORAL | Status: DC
  Administered 2016-02-14 – 2016-02-17 (×4): 1 mg via ORAL
  Filled 2016-02-14 (×4): qty 1

## 2016-02-14 MED ORDER — HYDROMORPHONE 1 MG/ML IV SOLN: INTRAVENOUS | Status: DC

## 2016-02-14 MED ORDER — DIPHENHYDRAMINE HCL 50 MG/ML IJ SOLN
25.0000 mg | Freq: Once | INTRAMUSCULAR | Status: AC
  Administered 2016-02-14: 25 mg via INTRAVENOUS
  Filled 2016-02-14: qty 1

## 2016-02-14 MED ORDER — SODIUM CHLORIDE 0.9% FLUSH: 9.0000 mL | INTRAVENOUS | Status: DC | PRN

## 2016-02-14 MED ORDER — ZOLPIDEM TARTRATE 5 MG PO TABS
5.0000 mg | ORAL_TABLET | Freq: Every evening | ORAL | Status: DC | PRN
  Administered 2016-02-14 – 2016-02-16 (×3): 5 mg via ORAL
  Filled 2016-02-14 (×3): qty 1

## 2016-02-14 MED ORDER — PROMETHAZINE HCL 25 MG PO TABS: 12.5000 mg | ORAL_TABLET | ORAL | Status: DC | PRN

## 2016-02-14 MED ORDER — TOPIRAMATE ER 100 MG PO CAP24: 100.0000 mg | ORAL_CAPSULE | Freq: Every day | ORAL | Status: DC

## 2016-02-14 MED ORDER — DULOXETINE HCL 30 MG PO CPEP
60.0000 mg | ORAL_CAPSULE | Freq: Every day | ORAL | Status: DC
  Administered 2016-02-14 – 2016-02-17 (×4): 60 mg via ORAL
  Filled 2016-02-14 (×4): qty 2

## 2016-02-14 MED ORDER — ONDANSETRON HCL 4 MG/2ML IJ SOLN: 4.0000 mg | Freq: Four times a day (QID) | INTRAMUSCULAR | Status: DC | PRN

## 2016-02-14 MED ORDER — HYDROXYUREA 500 MG PO CAPS
1000.0000 mg | ORAL_CAPSULE | Freq: Every day | ORAL | Status: DC
  Administered 2016-02-14: 1000 mg via ORAL
  Administered 2016-02-15 – 2016-02-17 (×3): 500 mg via ORAL
  Filled 2016-02-14 (×4): qty 2

## 2016-02-14 MED ORDER — ARIPIPRAZOLE 5 MG PO TABS
5.0000 mg | ORAL_TABLET | Freq: Two times a day (BID) | ORAL | Status: DC
  Administered 2016-02-14 – 2016-02-17 (×7): 5 mg via ORAL
  Filled 2016-02-14 (×9): qty 1

## 2016-02-14 MED ORDER — KETOROLAC TROMETHAMINE 30 MG/ML IJ SOLN: 30.0000 mg | Freq: Four times a day (QID) | INTRAMUSCULAR | Status: DC

## 2016-02-14 MED ORDER — DIPHENHYDRAMINE HCL 25 MG PO CAPS
25.0000 mg | ORAL_CAPSULE | ORAL | Status: DC | PRN
  Administered 2016-02-14: 50 mg via ORAL
  Administered 2016-02-15: 25 mg via ORAL
  Administered 2016-02-15: 50 mg via ORAL
  Administered 2016-02-15: 25 mg via ORAL
  Administered 2016-02-15 – 2016-02-16 (×2): 50 mg via ORAL
  Administered 2016-02-17: 25 mg via ORAL
  Filled 2016-02-14: qty 2
  Filled 2016-02-14: qty 1
  Filled 2016-02-14 (×3): qty 2
  Filled 2016-02-14: qty 1
  Filled 2016-02-14: qty 2
  Filled 2016-02-14: qty 1
  Filled 2016-02-14: qty 2

## 2016-02-14 MED ORDER — ENOXAPARIN SODIUM 40 MG/0.4ML ~~LOC~~ SOLN
40.0000 mg | SUBCUTANEOUS | Status: DC
  Filled 2016-02-14 (×2): qty 0.4

## 2016-02-14 MED ORDER — SODIUM CHLORIDE 0.9 % IV SOLN
25.0000 mg | INTRAVENOUS | Status: DC | PRN
  Filled 2016-02-14: qty 0.5

## 2016-02-14 MED ORDER — POLYETHYLENE GLYCOL 3350 17 G PO PACK: 17.0000 g | PACK | Freq: Every day | ORAL | Status: DC | PRN

## 2016-02-14 NOTE — Progress Notes (Signed)
Dilaudid PCA modified per MD order, witnessed by Gloriajean DellFred H. RN.

## 2016-02-14 NOTE — Progress Notes (Signed)
Pt reports benadryl po not working, requests RN to call MD and request one time IV dose.  RN explains policy to pt and pt request to please call and ask.  RN page on call sickle cell coverage to notify.

## 2016-02-14 NOTE — Progress Notes (Signed)
SICKLE CELL SERVICE PROGRESS NOTE  Brittany Foster WUJ:[id number] DOB: Oct 18, 1985 DOA: 02/13/2016 PCP: Jeanann Lewandowsky, MD  Assessment/Plan: Principal Problem:   Sickle cell pain crisis (HCC) Active Problems:   MDD (major depressive disorder), recurrent severe, without psychosis (HCC)   Migraine without status migrainosus, not intractable  1. Acute hip pain: Pt has recurrent hip pain that is diminishing her ability to ambulate. Currently she is unable to ambulate even functional distances. Will continue PCA, Toradol and IVF with a goal of ability to ambulate to safely perform ADL's.   2. Hb SS without crisis: Although she has Hb SS, I am unsure that she is actually in crisis. However her acute pain is being managed in the same manner  3. Anemia of Chronic Disease: Hb stable.  4. Chronic pain: Continue MS Contin.    Code Status: Full Code Family Communication: N/A Disposition Plan: Not yet ready for discharge  Daneli Butkiewicz A.  Pager [phone]. If 7PM-7AM, please contact night-coverage.  02/14/2016, 4:26 PM  LOS: 0 days   Interim History: Pt initially very hostile and defensive about her need to be hospitalized. However as the conversation continued I observed that she appears to be less depressed than on previous admissions and patient then became engaged stating that she was indeed feeling better and felt that the Abilify that was started during hospitalization 2 admissions ago was working. She was very happy about that and at that point was willing to talk in more detail about her pain. We used the computer in the room to look up articles about pain and after a detailed discussion she was finally able to teach back that while there was no dispute that she was in pain there was question about whether this pain represented crisis. We talked about chronic pain as a disease itself and the connection between altered CNS signaling and chronic pain. Pt was very tearful when I expressed that  I did not believe that she was "drug seeking" but rather "pain relief seeking". She expressed interest in finding out about a multimodal approach to her pain especially after reading a scientific abstract that describes the risk of hyperalgesia and other adverse effects associated with chronic opiate use. She is now open to seeing a counselor as an out patient. Pt also expressed her fear of being seen in a pain clinic due to very negative experiences in the past. I validated that pain clinics if not well run could be a traumatic experience for patients but also acknowledged to the patient my lack of personal experience as I had never personally utilized a pain clinic. She described her pain as being in her hips and at a level of 9/10. We attempted to ambulate but patient was only able to ambulate about 5 feet due to pain.   Consultants:  None  Procedures:  None  Antibiotics:  None   Objective: Vitals:   02/14/16 1153 02/14/16 1315 02/14/16 1427 02/14/16 1600  BP:   (!) 95/57   Pulse:   85   Resp: 16 14 13 13   Temp:   97.8 F (36.6 C)   TempSrc:   Oral   SpO2: 94% 95% 96% 98%  Weight:      Height:       Weight change:   Intake/Output Summary (Last 24 hours) at 02/14/16 1626 Last data filed at 02/14/16 1443  Gross per 24 hour  Intake             1260 ml  Output  975 ml  Net              285 ml    General: Alert, awake, oriented x3, in no acute distress.  HEENT: [place]/AT PEERL, EOMI, anicteric.  Neck: Trachea midline,  no masses, no thyromegal,y no JVD, no carotid bruit OROPHARYNX:  Moist, No exudate/ erythema/lesions.  Heart: Regular rate and rhythm, without murmurs, rubs, gallops, PMI non-displaced, no heaves or thrills on palpation.  Lungs: Clear to auscultation, no wheezing or rhonchi noted. No increased vocal fremitus resonant to percussion  Abdomen: Soft, nontender, nondistended, positive bowel sounds, no masses no hepatosplenomegaly noted.  Neuro: No focal  neurological deficits noted cranial nerves II through XII grossly intact. Strength at functional baseline in bilateral upper and lower extremities. Musculoskeletal: No warmth swelling or erythema around joints, no spinal tenderness noted. Psychiatric: Patient alert and oriented x3, good insight and cognition, good recent to remote recall.   Data Reviewed: Basic Metabolic Panel:  Recent Labs Lab 02/10/16 0947 02/13/16 1944  NA 138 139  K 3.6 3.5  CL 109 109  CO2 21* 25  GLUCOSE 104* 98  BUN 14 6  CREATININE 0.67 0.37*  CALCIUM 9.1 8.6*   Liver Function Tests:  Recent Labs Lab 02/10/16 0947 02/13/16 1944  AST 44* 49*  ALT 25 25  ALKPHOS 96 81  BILITOT 2.6* 1.7*  PROT 7.7 6.8  ALBUMIN 4.6 3.8   No results for input(s): LIPASE, AMYLASE in the last 168 hours. No results for input(s): AMMONIA in the last 168 hours. CBC:  Recent Labs Lab 02/10/16 0947 02/13/16 1944  WBC 10.8* 12.1*  NEUTROABS 7.9* 7.0  HGB 9.4* 8.0*  HCT 27.2* 22.5*  MCV 98.6 99.1  PLT 301 306   Cardiac Enzymes: No results for input(s): CKTOTAL, CKMB, CKMBINDEX, TROPONINI in the last 168 hours. BNP (last 3 results) No results for input(s): BNP in the last 8760 hours.  ProBNP (last 3 results) No results for input(s): PROBNP in the last 8760 hours.  CBG: No results for input(s): GLUCAP in the last 168 hours.  No results found for this or any previous visit (from the past 240 hour(s)).   Studies: Dg Chest 2 View  Result Date: 01/22/2016 CLINICAL DATA:  Shortness of breath. Arms, legs, and back hurt. History of sickle cell. Previous smoker. EXAM: CHEST  2 VIEW COMPARISON:  01/05/2016 FINDINGS: Right central venous catheter with tip over the low SVC region. No pneumothorax. Normal heart size and pulmonary vascularity. No focal airspace disease or consolidation. No blunting of costophrenic angles. No pneumothorax. Surgical clips in the right upper quadrant. Old catheter fragment demonstrated in  the upper mediastinum. This was present on previous CT from 10/19/2015. IMPRESSION: No active cardiopulmonary disease. Electronically Signed   By: Burman NievesWilliam  Stevens M.D.   On: 01/22/2016 22:27    Scheduled Meds: . ARIPiprazole  5 mg Oral BID  . DULoxetine  60 mg Oral Daily  . enoxaparin (LOVENOX) injection  40 mg Subcutaneous Q24H  . folic acid  1 mg Oral Daily  . HYDROmorphone   Intravenous Q4H  . hydroxyurea  1,000 mg Oral Daily  . ketorolac  30 mg Intravenous Q6H  . morphine  30 mg Oral Q12H  . oxyCODONE  15 mg Oral Q4H  . senna-docusate  1 tablet Oral BID  . topiramate  50 mg Oral BID   Continuous Infusions: . dextrose 5 % and 0.45% NaCl 1,000 mL (02/14/16 1151)    Principal Problem:   Sickle cell  pain crisis (HCC) Active Problems:   MDD (major depressive disorder), recurrent severe, without psychosis (HCC)   Migraine without status migrainosus, not intractable     In excess of 60 minutes spent during this visit. Greater than 50% involved face to face contact with the patient for assessment, counseling and coordination of care.

## 2016-02-14 NOTE — Progress Notes (Signed)
Pharmacy IV to PO conversion  The patient is receiving diphenhydramine by the intravenous route.  Based on the following criteria approved by the Pharmacy and Therapeutics Committee and the Medical Executive Committee, diphenhydramine is being converted to the equivalent oral dose form.   Not prescribed to treat or prevent a severe allergic reaction   Not prescribed as premedication prior to receiving blood product, biologic medication, antimicrobial, or chemotherapy agent   The patient has tolerated at least one dose of an oral or enteral medication   The patient has no evidence of active gastrointestinal bleeding or impaired GI absorption (gastrectomy, short bowel, patient on TNA or NPO).   The patient is not undergoing procedural sedation  If you have any questions about this conversion, please contact the Pharmacy Department (ext [phone]).  Thank you.  Bernadene Personrew Keysi Oelkers, PharmD Pager: [phone] 02/14/2016, 1:39 PM

## 2016-02-15 DIAGNOSIS — R269 Unspecified abnormalities of gait and mobility: Secondary | ICD-10-CM

## 2016-02-15 NOTE — Progress Notes (Signed)
SICKLE CELL SERVICE PROGRESS NOTE  Richmond CampbellMiranda Montee ZOX:[id number] DOB: 09/23/1985 DOA: 02/13/2016 PCP: Jeanann LewandowskyJEGEDE, OLUGBEMIGA, MD  Assessment/Plan: Principal Problem:   Sickle cell pain crisis (HCC) Active Problems:   MDD (major depressive disorder), recurrent severe, without psychosis (HCC)   Sickle cell anemia with pain (HCC)   Migraine without status migrainosus, not intractable  1. Acute Hip Pain: Pt has recurrent hip pain that is diminishing her ability to ambulate. Currently she is unable to ambulate even functional distances. Will continue PCA, Toradol and IVF with a goal of ability to ambulate to safely perform ADL's.   2. Gait Abnormality: Will consult PT for assistive device.  3. Hb SS without crisis:  Although she has Hb SS, I am unsure that she is actually in crisis. However her acute pain is being managed in the same manner  4. Anemia of Chronic Disease: Hb stable. 5. Chronic pain: Continue MS Contin   Code Status: Full Code Family Communication: N/A Disposition Plan: Anticipate discharge tomorrow or Monday.  Perlie Stene A.  Pager [phone]. If 7PM-7AM, please contact night-coverage.  02/15/2016, 12:03 PM  LOS: 1 day   Interim History: Pt much more pleasant today. She is still having pain in her B/L hips and her ambulation in impaired. Pt reports pain improved today. She has used 49.17 mg with 64/55:demands/deliveries in the last 24 hours. Had further discussion about multimodal treatment of chronic pain. Pt interested in Psychotherapy as an out-patient. Also discussed the indications for transfusion and the risks involved in repeated transfusions.   Consultants:  None  Procedures:  None  Antibiotics:  None   Objective: Vitals:   02/15/16 0644 02/15/16 0840 02/15/16 1009 02/15/16 1111  BP:   (!) 94/49   Pulse:   84   Resp: 16 17 16 15   Temp:   98.7 F (37.1 C)   TempSrc:   Oral   SpO2: 98% 98% 96% 99%  Weight:      Height:       Weight change: 2.041  kg (4 lb 8 oz)  Intake/Output Summary (Last 24 hours) at 02/15/16 1203 Last data filed at 02/15/16 1010  Gross per 24 hour  Intake             2870 ml  Output              875 ml  Net             1995 ml    General: Alert, awake, oriented x3, in no acute distress.  HEENT: [place]/AT PEERL, EOMI, anicteric Heart: Regular rate and rhythm, without murmurs, rubs, gallops, PMI non-displaced, no heaves or thrills on palpation.  Lungs: Clear to auscultation, no wheezing or rhonchi noted. No increased vocal fremitus resonant to percussion  Abdomen: Soft, nontender, nondistended, positive bowel sounds, no masses no hepatosplenomegaly noted..  Neuro: No focal neurological deficits noted cranial nerves II through XII grossly intact. Strength at functional baseline in bilateral upper and lower extremities. Musculoskeletal: No warm swelling or erythema around joints, no spinal tenderness noted. Psychiatric: Patient alert and oriented x3, good insight and cognition, good recent to remote recall.    Data Reviewed: Basic Metabolic Panel:  Recent Labs Lab 02/10/16 0947 02/13/16 1944  NA 138 139  K 3.6 3.5  CL 109 109  CO2 21* 25  GLUCOSE 104* 98  BUN 14 6  CREATININE 0.67 0.37*  CALCIUM 9.1 8.6*   Liver Function Tests:  Recent Labs Lab 02/10/16 0947 02/13/16 1944  AST 44*  49*  ALT 25 25  ALKPHOS 96 81  BILITOT 2.6* 1.7*  PROT 7.7 6.8  ALBUMIN 4.6 3.8   No results for input(s): LIPASE, AMYLASE in the last 168 hours. No results for input(s): AMMONIA in the last 168 hours. CBC:  Recent Labs Lab 02/10/16 0947 02/13/16 1944  WBC 10.8* 12.1*  NEUTROABS 7.9* 7.0  HGB 9.4* 8.0*  HCT 27.2* 22.5*  MCV 98.6 99.1  PLT 301 306   Cardiac Enzymes: No results for input(s): CKTOTAL, CKMB, CKMBINDEX, TROPONINI in the last 168 hours. BNP (last 3 results) No results for input(s): BNP in the last 8760 hours.  ProBNP (last 3 results) No results for input(s): PROBNP in the last 8760  hours.  CBG: No results for input(s): GLUCAP in the last 168 hours.  No results found for this or any previous visit (from the past 240 hour(s)).   Studies: Dg Chest 2 View  Result Date: 01/22/2016 CLINICAL DATA:  Shortness of breath. Arms, legs, and back hurt. History of sickle cell. Previous smoker. EXAM: CHEST  2 VIEW COMPARISON:  01/05/2016 FINDINGS: Right central venous catheter with tip over the low SVC region. No pneumothorax. Normal heart size and pulmonary vascularity. No focal airspace disease or consolidation. No blunting of costophrenic angles. No pneumothorax. Surgical clips in the right upper quadrant. Old catheter fragment demonstrated in the upper mediastinum. This was present on previous CT from 10/19/2015. IMPRESSION: No active cardiopulmonary disease. Electronically Signed   By: Burman NievesWilliam  Stevens M.D.   On: 01/22/2016 22:27    Scheduled Meds: . ARIPiprazole  5 mg Oral BID  . DULoxetine  60 mg Oral Daily  . enoxaparin (LOVENOX) injection  40 mg Subcutaneous Q24H  . folic acid  1 mg Oral Daily  . HYDROmorphone   Intravenous Q4H  . hydroxyurea  1,000 mg Oral Daily  . ketorolac  30 mg Intravenous Q6H  . morphine  30 mg Oral Q12H  . oxyCODONE  15 mg Oral Q4H  . senna-docusate  1 tablet Oral BID  . topiramate  50 mg Oral BID   Continuous Infusions: . dextrose 5 % and 0.45% NaCl 125 mL/hr at 02/14/16 2149    Principal Problem:   Sickle cell pain crisis (HCC) Active Problems:   MDD (major depressive disorder), recurrent severe, without psychosis (HCC)   Sickle cell anemia with pain (HCC)   Migraine without status migrainosus, not intractable        In excess of 25 minutes spent during this visit. Greater than 50% involved face to face contact with the patient for assessment, counseling and coordination of care.

## 2016-02-15 NOTE — Progress Notes (Signed)
Patient walked to door way of room, unable to proceed further due to legs shaking, oxygen saturation 96-100%.

## 2016-02-16 DIAGNOSIS — K5903 Drug induced constipation: Secondary | ICD-10-CM

## 2016-02-16 LAB — RETICULOCYTES
RBC.: 2.05 MIL/uL — ABNORMAL LOW (ref 3.87–5.11)
Retic Count, Absolute: 250.1 K/uL — ABNORMAL HIGH (ref 19.0–186.0)
Retic Ct Pct: 12.2 % — ABNORMAL HIGH (ref 0.4–3.1)

## 2016-02-16 LAB — CBC WITH DIFFERENTIAL/PLATELET
Basophils Absolute: 0 10*3/uL (ref 0.0–0.1)
Basophils Relative: 0 %
EOS PCT: 2 %
Eosinophils Absolute: 0.3 10*3/uL (ref 0.0–0.7)
HEMATOCRIT: 20.8 % — AB (ref 36.0–46.0)
Hemoglobin: 7.2 g/dL — ABNORMAL LOW (ref 12.0–15.0)
LYMPHS ABS: 3.4 10*3/uL (ref 0.7–4.0)
LYMPHS PCT: 29 %
MCH: 35.1 pg — ABNORMAL HIGH (ref 26.0–34.0)
MCHC: 34.6 g/dL (ref 30.0–36.0)
MCV: 101.5 fL — AB (ref 78.0–100.0)
MONO ABS: 0.8 10*3/uL (ref 0.1–1.0)
Monocytes Relative: 7 %
Neutro Abs: 7.3 10*3/uL (ref 1.7–7.7)
Neutrophils Relative %: 62 %
Platelets: 226 10*3/uL (ref 150–400)
RBC: 2.05 MIL/uL — ABNORMAL LOW (ref 3.87–5.11)
RDW: 22.7 % — AB (ref 11.5–15.5)
WBC: 11.8 10*3/uL — ABNORMAL HIGH (ref 4.0–10.5)

## 2016-02-16 MED ORDER — HYDROMORPHONE 1 MG/ML IV SOLN
INTRAVENOUS | Status: DC
  Administered 2016-02-16: 11.2 mg via INTRAVENOUS
  Administered 2016-02-17: 8.4 mg via INTRAVENOUS
  Administered 2016-02-17: 9.6 mg via INTRAVENOUS
  Administered 2016-02-17: 25 mg via INTRAVENOUS
  Filled 2016-02-16 (×2): qty 25

## 2016-02-16 MED ORDER — LACTULOSE 10 GM/15ML PO SOLN
30.0000 g | Freq: Once | ORAL | Status: AC
Start: 2016-02-16 — End: 2016-02-16
  Administered 2016-02-16: 10 g via ORAL
  Filled 2016-02-16: qty 45

## 2016-02-16 NOTE — Progress Notes (Signed)
SICKLE CELL SERVICE PROGRESS NOTE  Brittany Foster:[id number] DOB: 12/02/1985 DOA: 02/13/2016 PCP: Jeanann LewandowskyJEGEDE, OLUGBEMIGA, Foster  Assessment/Plan: Principal Problem:   Sickle cell pain crisis (HCC) Active Problems:   MDD (major depressive disorder), recurrent severe, without psychosis (HCC)   Sickle cell anemia with pain (HCC)   Migraine without status migrainosus, not intractable  1. Acute Hip Pain: Pt has recurrent hip pain that is diminishing her ability to ambulate. Currently she was able to ambulate with walker for 100 ft with minimal assistance. Will continue PCA but discontinue  Clinician assisted doses. Continue Toradol. 2. Gait Abnormality:  PT consulted for assistive device but was unable to see patient today. I ambulated patient with rolling walker and she required minimal assistance with ambulation of 200 ft.  3. Hb SS without crisis:  Although she has Hb SS, I am unsure that she is actually in crisis. However her acute pain is being managed in the same manner  4. Constipation: Will give Lactulose today.  5. Anemia of Chronic Disease: Hb stable. Continue Hydrea.  6. Chronic pain: Continue MS Contin   Code Status: Full Code Family Communication: N/A Disposition Plan: Anticipate discharge tomorrow.  Brittany A.  Pager [phone]. If 7PM-7AM, please contact night-coverage.  02/16/2016, 4:53 PM  LOS: 2 days   Interim History: Pt much more pleasant today. She has not had a BM since admission.  Consultants:  None  Procedures:  None  Antibiotics:  None   Objective: Vitals:   02/16/16 0539 02/16/16 0747 02/16/16 1332 02/16/16 1540  BP:   (!) 102/52   Pulse:   (!) 106   Resp: 17 14 16 17   Temp:   98.4 F (36.9 C)   TempSrc:   Oral   SpO2: 99% 94% 94% 97%  Weight:      Height:       Weight change: 0.59 kg (1 lb 4.8 oz)  Intake/Output Summary (Last 24 hours) at 02/16/16 1653 Last data filed at 02/16/16 0400  Gross per 24 hour  Intake             2970 ml   Output              675 ml  Net             2295 ml    General: Alert, awake, oriented x3, in no acute distress. Very pleasant today. HEENT: [place]/AT PEERL, EOMI, anicteric Heart: Regular rate and rhythm, without murmurs, rubs, gallops, PMI non-displaced, no heaves or thrills on palpation.  Lungs: Clear to auscultation, no wheezing or rhonchi noted. No increased vocal fremitus resonant to percussion  Abdomen: Soft, nontender, nondistended, positive bowel sounds, no masses no hepatosplenomegaly noted.  Neuro: No focal neurological deficits noted cranial nerves II through XII grossly intact. Strength at functional baseline in bilateral upper and lower extremities. Musculoskeletal: No warmth swelling or erythema around joints, no spinal tenderness noted. Psychiatric: Patient alert and oriented x3, good insight and cognition, good recent to remote recall.    Data Reviewed: Basic Metabolic Panel:  Recent Labs Lab 02/10/16 0947 02/13/16 1944  NA 138 139  K 3.6 3.5  CL 109 109  CO2 21* 25  GLUCOSE 104* 98  BUN 14 6  CREATININE 0.67 0.37*  CALCIUM 9.1 8.6*   Liver Function Tests:  Recent Labs Lab 02/10/16 0947 02/13/16 1944  AST 44* 49*  ALT 25 25  ALKPHOS 96 81  BILITOT 2.6* 1.7*  PROT 7.7 6.8  ALBUMIN 4.6 3.8  No results for input(s): LIPASE, AMYLASE in the last 168 hours. No results for input(s): AMMONIA in the last 168 hours. CBC:  Recent Labs Lab 02/10/16 0947 02/13/16 1944 02/16/16 1455  WBC 10.8* 12.1* 11.8*  NEUTROABS 7.9* 7.0 7.3  HGB 9.4* 8.0* 7.2*  HCT 27.2* 22.5* 20.8*  MCV 98.6 99.1 101.5*  PLT 301 306 226   Cardiac Enzymes: No results for input(s): CKTOTAL, CKMB, CKMBINDEX, TROPONINI in the last 168 hours. BNP (last 3 results) No results for input(s): BNP in the last 8760 hours.  ProBNP (last 3 results) No results for input(s): PROBNP in the last 8760 hours.  CBG: No results for input(s): GLUCAP in the last 168 hours.  No results found for  this or any previous visit (from the past 240 hour(s)).   Studies: Dg Chest 2 View  Result Date: 01/22/2016 CLINICAL DATA:  Shortness of breath. Arms, legs, and back hurt. History of sickle cell. Previous smoker. EXAM: CHEST  2 VIEW COMPARISON:  01/05/2016 FINDINGS: Right central venous catheter with tip over the low SVC region. No pneumothorax. Normal heart size and pulmonary vascularity. No focal airspace disease or consolidation. No blunting of costophrenic angles. No pneumothorax. Surgical clips in the right upper quadrant. Old catheter fragment demonstrated in the upper mediastinum. This was present on previous CT from 10/19/2015. IMPRESSION: No active cardiopulmonary disease. Electronically Signed   By: Brittany NievesWilliam  Stevens M.D.   On: 01/22/2016 22:27    Scheduled Meds: . ARIPiprazole  5 mg Oral BID  . DULoxetine  60 mg Oral Daily  . enoxaparin (LOVENOX) injection  40 mg Subcutaneous Q24H  . folic acid  1 mg Oral Daily  . HYDROmorphone   Intravenous Q4H  . hydroxyurea  1,000 mg Oral Daily  . morphine  30 mg Oral Q12H  . oxyCODONE  15 mg Oral Q4H  . senna-docusate  1 tablet Oral BID  . topiramate  50 mg Oral BID   Continuous Infusions: . dextrose 5 % and 0.45% NaCl 10 mL/hr at 02/16/16 1047    Principal Problem:   Sickle cell pain crisis (HCC) Active Problems:   MDD (major depressive disorder), recurrent severe, without psychosis (HCC)   Sickle cell anemia with pain (HCC)   Migraine without status migrainosus, not intractable        In excess of 25 minutes spent during this visit. Greater than 50% involved face to face contact with the patient for assessment, counseling and coordination of care.

## 2016-02-17 DIAGNOSIS — G8918 Other acute postprocedural pain: Secondary | ICD-10-CM

## 2016-02-17 LAB — URINALYSIS, ROUTINE W REFLEX MICROSCOPIC
BILIRUBIN URINE: NEGATIVE
GLUCOSE, UA: NEGATIVE mg/dL
KETONES UR: NEGATIVE mg/dL
Nitrite: NEGATIVE
PH: 7 (ref 5.0–8.0)
Protein, ur: NEGATIVE mg/dL
Specific Gravity, Urine: 1.008 (ref 1.005–1.030)

## 2016-02-17 MED ORDER — HEPARIN SOD (PORK) LOCK FLUSH 100 UNIT/ML IV SOLN
250.0000 [IU] | INTRAVENOUS | Status: DC | PRN
  Filled 2016-02-17: qty 5

## 2016-02-17 MED ORDER — HYDROMORPHONE HCL 2 MG/ML IJ SOLN
2.0000 mg | Freq: Once | INTRAMUSCULAR | Status: AC
  Administered 2016-02-17: 2 mg via INTRAVENOUS
  Filled 2016-02-17: qty 1

## 2016-02-17 MED ORDER — CIPROFLOXACIN HCL 250 MG PO TABS: 250.0000 mg | ORAL_TABLET | Freq: Two times a day (BID) | ORAL | Status: DC

## 2016-02-17 MED ORDER — CIPROFLOXACIN HCL 250 MG PO TABS: 250.0000 mg | ORAL_TABLET | Freq: Two times a day (BID) | ORAL | 0 refills | Status: DC

## 2016-02-17 NOTE — Evaluation (Signed)
Physical Therapy Evaluation Patient Details Name: Brittany Foster MRN: [id number] DOB: 05-27-1985 Today's Date: 02/17/2016   History of Present Illness  Brittany Foster is a 30 y.o. female with medical history significant of sickle cell anemia, migraine headaches, depression, who presents with pain all over.  Clinical Impression  Pt admitted with above diagnosis. Pt currently with functional limitations due to the deficits listed below (see PT Problem List).  Pt will benefit from skilled PT to increase their independence and safety with mobility to allow discharge to the venue listed below.  Pt ambulated with min/guard for 67' with RW, limited by 8/10 B hip pain.  Educated on use of RW at home and use of cane and rail on stairs.  Pt verbalized understanding.     Follow Up Recommendations No PT follow up;Supervision - Intermittent    Equipment Recommendations  None recommended by PT    Recommendations for Other Services       Precautions / Restrictions Precautions Precautions: None Restrictions Weight Bearing Restrictions: No      Mobility  Bed Mobility Overal bed mobility: Modified Independent                Transfers Overall transfer level: Modified independent                  Ambulation/Gait Ambulation/Gait assistance: Min guard Ambulation Distance (Feet): 85 Feet Assistive device: Rolling walker (2 wheeled) Gait Pattern/deviations: Decreased step length - right;Decreased step length - left;Antalgic Gait velocity: decreased   General Gait Details: Pt educated on use of UE support to decrease WB through LE because of 8/10 pain.  As gait progressed, she became more shaky due to pain.  Decreased speed.    Stairs Stairs:  (did not perform, but PT educated and demonstrated step to pattern and encouraged rail and cane.)          Wheelchair Mobility    Modified Rankin (Stroke Patients Only)       Balance Overall balance assessment: Needs assistance    Sitting balance-Leahy Scale: Good Sitting balance - Comments: Pt able to don socks while sitting EOB   Standing balance support: During functional activity Standing balance-Leahy Scale: Fair                               Pertinent Vitals/Pain Pain Assessment: 0-10 Pain Score: 8  Pain Location: B hips and legs Pain Intervention(s): PCA encouraged;Heat applied;Limited activity within patient's tolerance;Monitored during session;Repositioned    Home Living Family/patient expects to be discharged to:: Private residence Living Arrangements: Children Available Help at Discharge: Available PRN/intermittently;Friend(s)   Home Access: Stairs to enter Entrance Stairs-Rails: Right Entrance Stairs-Number of Steps: flight Home Layout: One level Home Equipment: Cane - single point;Walker - 2 wheels      Prior Function Level of Independence: Independent               Hand Dominance        Extremity/Trunk Assessment   Upper Extremity Assessment Upper Extremity Assessment: Overall WFL for tasks assessed    Lower Extremity Assessment Lower Extremity Assessment: Overall WFL for tasks assessed    Cervical / Trunk Assessment Cervical / Trunk Assessment: Normal  Communication   Communication: No difficulties (soft spoken)  Cognition Arousal/Alertness: Awake/alert Behavior During Therapy: WFL for tasks assessed/performed Overall Cognitive Status: Within Functional Limits for tasks assessed  General Comments      Exercises     Assessment/Plan    PT Assessment Patient needs continued PT services  PT Problem List Decreased strength;Decreased activity tolerance;Decreased balance;Decreased mobility;Decreased knowledge of use of DME          PT Treatment Interventions DME instruction;Gait training;Stair training;Functional mobility training;Therapeutic activities;Therapeutic exercise;Balance training;Patient/family education    PT  Goals (Current goals can be found in the Care Plan section)  Acute Rehab PT Goals Patient Stated Goal: decrease pain PT Goal Formulation: With patient Time For Goal Achievement: 02/24/16 Potential to Achieve Goals: Good    Frequency Min 3X/week   Barriers to discharge Inaccessible home environment flight of stairs    Co-evaluation               End of Session   Activity Tolerance: Patient limited by pain Patient left: in chair;with call bell/phone within reach Nurse Communication: Mobility status (nurse tech)         Time: [id number] PT Time Calculation (min) (ACUTE ONLY): 27 min   Charges:   PT Evaluation $PT Eval Low Complexity: 1 Procedure PT Treatments $Gait Training: 8-22 mins   PT G Codes:        Brittany Foster 02/17/2016, 9:48 AM

## 2016-02-17 NOTE — Discharge Summary (Addendum)
Brittany CampbellMiranda Foster MRN: [id number] DOB/AGE: 31/03/1985 31 y.o.  Admit date: 02/13/2016 Discharge date: 02/17/2016  Primary Care Physician:  Brittany Foster   Discharge Diagnoses:   Patient Active Problem List   Diagnosis Date Noted  . Sickle cell anemia with pain (HCC) 02/14/2016  . Migraine without status migrainosus, not intractable   . Sickle cell anemia with crisis (HCC) 01/30/2016  . Hypotension 01/26/2016  . Opiate dependence (HCC) 01/23/2016  . MDD (major depressive disorder), recurrent severe, without psychosis (HCC) 01/10/2016  . Vitamin D deficiency 01/08/2015  . Chronic pain syndrome 01/08/2015  . 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 Anemia 02/09/2014  . Neuropathy (HCC) 02/09/2014  . Chronic headache disorder 02/09/2014    DISCHARGE MEDICATION: Allergies as of 02/17/2016      Reactions   Ultram [tramadol] Other (See Comments)   seizures   Zofran [ondansetron Hcl] Nausea And Vomiting   Buprenorphine Hcl Hives, Rash   Shaking Tolerates Percocet, Norco, and buprenorphine   Morphine And Related Hives, Rash, Other (See Comments)   Shaking Tolerates Percocet, Norco, Dilaudid, and buprenorphine   Tape Rash      Medication List    TAKE these medications     ARIPiprazole 5 MG tablet Commonly known as:  ABILIFY Take 1 tablet (5 mg total) by mouth 2 (two) times daily.   Ciprofloxacin 250 mg tablet Commonly known as:  CIPRO Take 1 tablet (250 mg) by mouth 2 (two) times daily   DULoxetine 60 MG capsule Commonly known as:  CYMBALTA TAKE 1 CAPSULE BY MOUTH DAILY.   folic acid 1 MG tablet Commonly known as:  FOLVITE Take 1 tablet (1 mg total) by mouth daily.   gabapentin 300 MG capsule Commonly known as:  NEURONTIN TAKE 1 CAPSULE BY MOUTH 3 TIMES DAILY.   hydroxyurea 500 MG capsule Commonly known as:  HYDREA TAKE 2 CAPSULES BY MOUTH DAILY. MAY TAKE WITH FOOD TO MINIMIZE GI SIDE EFFECTS.   ibuprofen 200 MG tablet Commonly known as:   ADVIL,MOTRIN Take 400 mg by mouth every 6 (six) hours as needed for moderate pain.   morphine 30 MG 12 hr tablet Commonly known as:  MS CONTIN TAKE ONE TABLET BY MOUTH EVERY TWELVE HOURS   oxyCODONE-acetaminophen 10-325 MG tablet Commonly known as:  PERCOCET TAKE ONE TABLET EVERY 4 HOURS AS NEEDED FOR pain   potassium chloride SA 20 MEQ tablet Commonly known as:  K-DUR,KLOR-CON Take 2 tablets (40 mEq total) by mouth daily.   Topiramate ER 100 MG Cp24 Take 100 mg by mouth at bedtime.         Consults:    SIGNIFICANT DIAGNOSTIC STUDIES:  Dg Chest 2 View  Result Date: 01/22/2016 CLINICAL DATA:  Shortness of breath. Arms, legs, and back hurt. History of sickle cell. Previous smoker. EXAM: CHEST  2 VIEW COMPARISON:  01/05/2016 FINDINGS: Right central venous catheter with tip over the low SVC region. No pneumothorax. Normal heart size and pulmonary vascularity. No focal airspace disease or consolidation. No blunting of costophrenic angles. No pneumothorax. Surgical clips in the right upper quadrant. Old catheter fragment demonstrated in the upper mediastinum. This was present on previous CT from 10/19/2015. IMPRESSION: No active cardiopulmonary disease. Electronically Signed   By: Burman NievesWilliam  Stevens M.D.   On: 01/22/2016 22:27      No results found for this or any previous visit (from the past 240 hour(s)).  BRIEF ADMITTING H & P: Brittany CampbellMiranda Foster is a 30 y.o. female with medical history significant  of sickle cell anemia, migraine headaches, depression, who presents with pain all over.  Pt states that she has worsening pain in her legs and back today. She also has mild pain in abdomen and chest.The pain in legs and back is constant, 10 out of 10 in severity, nonradiating. It is not aggravated or alleviated by any known factors. Patient states that her pain is typical for her past sickle cell pain flare up. She has mild shortness of breath, but no tenderness over calf areas. Ptstates  that she took her home painmedications withouthelp. She has nausea, but no diarrhea. No cough, fever, chills, symptoms for UTI or unilateral weakness.  ED Course: pt was found to have WBC 12.1, electrolytes and renal function okay, temperature normal, tachycardia, oxygen saturation 100% on room air. Patient is placed MedSurg bed for observation.   Hospital Course:  Present on Admission: . Sickle cell pain crisis (HCC) . MDD (major depressive disorder), recurrent severe, without psychosis (HCC) . Sickle cell anemia with pain (HCC)  This is a patient with Hb SS and chronic pain which is likely an independent pain generating condition in this patient. She presented to the ED with c/o pain in b/l hips. Pt was treated for the acute pain with Dilaudid PCA, and her usual medications including MS Contin and Oxycodone. I do not believe that she is in crisis. However she did have limitation of function due to the pain thus was treated for the pain acutely. We had several conversations during her hospitalization during which there was education provided on syndromes of chronic pain. She also agreed that she was not in crisis and doubted that she was having crises as frequently as she was presenting for acute care. However she did acknowledge that the pain was overwhelming and while she is not drug seeking, I do believe that she is "pain relief seeking". As her function improved the pain medication was weaned and today she was observed (without her knowledge) by me ambulating to the bathroom without any antalgia and with a speedy gait. Additionally she is able to sit in the bed in an Bangladeshindian style position as well and weight bearing on her knees and performing transitional movements without any difficulty. Pt states that she is willing to pursue Psychotherapy as an out-patient as she realizes that this plays a big part in her pain perception. Pt also had increased WBC in the urine without any symptoms. However on  the day of discharge she complained of dysuria. A repeat U/A was sent for urinalysis and culture. It revealed a continued increase in WBC. She will be treated empirically for a UTI with Ciprofloxacin and I will follow up on the culture. Pt is discharged home on her pre-hospital medications. She is independent in gait as demonstrated by her ambulating to and from the bathroom.   Disposition and Follow-up: Pt is discharged home in good condition. She is to follow up with her PMD as scheduled on 03/10/2016   DISCHARGE EXAM:  General: Alert, awake, oriented x3, well appearing.  HEENT: [place]/AT PEERL, EOMI, anicteric Neck: Trachea midline, no masses, no thyromegal,y no JVD, no carotid bruit OROPHARYNX: Moist, No exudate/ erythema/lesions.  Heart: Regular rate and rhythm, without murmurs, rubs, gallops or S3. PMI non-displaced. Exam reveals no decreased pulses. Pulmonary/Chest: Normal effort. Breath sounds normal. No. Apnea. Clear to auscultation,no stridor,  no wheezing and no rhonchi noted. No respiratory distress and no tenderness noted. Abdomen: Soft, nontender, nondistended, normal bowel sounds, no masses no hepatosplenomegaly  noted. No fluid wave and no ascites. There is no guarding or rebound. Neuro: Alert and oriented to person, place and time. Normal motor skills, Displays no atrophy or tremors and exhibits normal muscle tone.  No focal neurological deficits noted cranial nerves II through XII grossly intact. No sensory deficit noted. Strength at baseline in bilateral upper and lower extremities. Gait normal. Musculoskeletal: No warm swelling or erythema around joints, no spinal tenderness noted. Psychiatric: Patient alert and oriented x3, good insight and cognition, good recent to remote recall. Mood, affect and judgement normal Skin: Skin is warm and dry. No bruising, no ecchymosis and no rash noted. Pt is not diaphoretic. No erythema. No pallor    Blood pressure  95/55 (Normal BP for this  patient), pulse 95, temperature 98.5 F (36.9 C), temperature source Oral, resp. rate 16, height 5' (1.524 m), weight 59.4 kg (130 lb 15.3 oz), last menstrual period 02/07/2016, SpO2 98 %.  No results for input(s): NA, K, CL, CO2, GLUCOSE, BUN, CREATININE, CALCIUM, MG, PHOS in the last 72 hours. No results for input(s): AST, ALT, ALKPHOS, BILITOT, PROT, ALBUMIN in the last 72 hours. No results for input(s): LIPASE, AMYLASE in the last 72 hours.  Recent Labs  02/16/16 1455  WBC 11.8*  NEUTROABS 7.3  HGB 7.2*  HCT 20.8*  MCV 101.5*  PLT 226     Total time spent including face to face and decision making was greater than 30 minutes  Signed: [name],[name] 02/17/2016, 2:20 PM

## 2016-02-17 NOTE — Progress Notes (Addendum)
Dressing changed to single lumen picc and cap changed. Flushed with 10ml Normal saline and 250 units heparin. Pt states she came in with picc line and was not to be taken out. She goes to the sickle clinic for flushes and dressing changes. Discharge orders reviewed with pt, states understanding.

## 2016-02-17 NOTE — Progress Notes (Addendum)
Patient requested to speak to RN regarding benadryl. Patient wants to have IV benadryl states that the PO benadryl does not work. RN educated patient on policy that if she is able to take PO medications then the IV benadryl would not be ordered. Patient became very agitated and requested to speak to doctor. RN educated patient that on call coverage is for emergency purposes and that she would have to speak with attending MD in the morning. This RN offered PO benadryl and patient refused. Will continue with plan of care.

## 2016-02-18 ENCOUNTER — Telehealth (HOSPITAL_COMMUNITY): Payer: Self-pay | Admitting: *Deleted

## 2016-02-18 NOTE — Telephone Encounter (Signed)
Advised per L. [name] NP to hydrate really well, continue taking her pain medications and rest today. Patient voices understanding.

## 2016-02-18 NOTE — Telephone Encounter (Signed)
Called complaining of pain in legs and hips with pain intensity of 8/10.  Denies chest pain, fever, abdominal pain or diarrhea. Has nausea she says. Took MS contin and oxycodone at 6:30 am this morning. Will contact medical provider and call her back.

## 2016-02-19 ENCOUNTER — Telehealth (HOSPITAL_COMMUNITY): Payer: Self-pay | Admitting: *Deleted

## 2016-02-19 NOTE — Telephone Encounter (Signed)
Advised patient  per L. [name] to drink plenty of water, hydrate well, take pain medication as prescribed. Patient became angry and interrupted my verbal instructions. She is requesting Dr. Louis MeckelJegede's office telephone number. Tried to tell her I didn't know where he was today..dial tone audible.

## 2016-02-19 NOTE — Telephone Encounter (Signed)
[facility] of pain in hips, legs and back. 8/10 pain intensity. Denies fever, chest pain and abdominal symptoms. Took MS Contin and oxycodone at 7 am without relief. Wants to come to the day hospital for treatment. Explained to patient that I will contact provider and call her back.

## 2016-02-20 LAB — URINE CULTURE

## 2016-02-21 ENCOUNTER — Other Ambulatory Visit: Payer: Self-pay | Admitting: Internal Medicine

## 2016-02-21 ENCOUNTER — Telehealth: Payer: Self-pay

## 2016-02-21 MED ORDER — OXYCODONE-ACETAMINOPHEN 10-325 MG PO TABS: ORAL_TABLET | ORAL | 0 refills | Status: DC

## 2016-02-25 ENCOUNTER — Encounter (HOSPITAL_COMMUNITY): Payer: Self-pay | Admitting: Emergency Medicine

## 2016-02-25 ENCOUNTER — Emergency Department (HOSPITAL_COMMUNITY)
Admission: EM | Admit: 2016-02-25 | Discharge: 2016-02-25 | Disposition: A | Discharge status: Home / Self Care | Payer: Medicaid Other | Attending: Dermatology | Admitting: Dermatology

## 2016-02-25 DIAGNOSIS — D5700 Hb-SS disease with crisis, unspecified: Secondary | ICD-10-CM | POA: Diagnosis not present

## 2016-02-25 DIAGNOSIS — Z5321 Procedure and treatment not carried out due to patient leaving prior to being seen by health care provider: Secondary | ICD-10-CM | POA: Insufficient documentation

## 2016-02-25 NOTE — ED Triage Notes (Signed)
Pt reports chest [aon, bilateral hip and legs pain. Also reports shortness of breath. Alert and oriented x 4. EKG done in triage.

## 2016-02-25 NOTE — ED Notes (Signed)
Pt has picline for labs.

## 2016-02-25 NOTE — ED Notes (Signed)
Pt requesting IV pain meds in waiting room.  Explained to patient what meds I am able to give while holding.  Pt wants IV.  Spoke with PA, Will.  OK to offer her the pain protocol but no IV meds in lobby.

## 2016-02-25 NOTE — ED Notes (Signed)
Called pt for triage, pt not in lobby 

## 2016-02-26 ENCOUNTER — Other Ambulatory Visit: Payer: Self-pay | Admitting: Family Medicine

## 2016-02-26 ENCOUNTER — Non-Acute Institutional Stay (HOSPITAL_COMMUNITY)
Admission: AD | Admit: 2016-02-26 | Discharge: 2016-02-26 | Disposition: A | Discharge status: Home / Self Care | Payer: Medicaid Other | Source: Ambulatory Visit | Attending: Internal Medicine | Admitting: Internal Medicine

## 2016-02-26 ENCOUNTER — Telehealth (HOSPITAL_COMMUNITY): Payer: Self-pay | Admitting: *Deleted

## 2016-02-26 ENCOUNTER — Other Ambulatory Visit: Payer: Medicaid Other

## 2016-02-26 ENCOUNTER — Encounter (HOSPITAL_COMMUNITY): Payer: Self-pay | Admitting: *Deleted

## 2016-02-26 DIAGNOSIS — Z79899 Other long term (current) drug therapy: Secondary | ICD-10-CM | POA: Insufficient documentation

## 2016-02-26 DIAGNOSIS — Z791 Long term (current) use of non-steroidal anti-inflammatories (NSAID): Secondary | ICD-10-CM | POA: Diagnosis not present

## 2016-02-26 DIAGNOSIS — Z79891 Long term (current) use of opiate analgesic: Secondary | ICD-10-CM

## 2016-02-26 DIAGNOSIS — D5700 Hb-SS disease with crisis, unspecified: Secondary | ICD-10-CM | POA: Diagnosis not present

## 2016-02-26 DIAGNOSIS — R52 Pain, unspecified: Secondary | ICD-10-CM | POA: Insufficient documentation

## 2016-02-26 DIAGNOSIS — Z87891 Personal history of nicotine dependence: Secondary | ICD-10-CM | POA: Insufficient documentation

## 2016-02-26 LAB — URINALYSIS, ROUTINE W REFLEX MICROSCOPIC
BILIRUBIN URINE: NEGATIVE
GLUCOSE, UA: NEGATIVE mg/dL
HGB URINE DIPSTICK: NEGATIVE
KETONES UR: NEGATIVE mg/dL
NITRITE: NEGATIVE
PH: 5 (ref 5.0–8.0)
PROTEIN: NEGATIVE mg/dL
Specific Gravity, Urine: 1.013 (ref 1.005–1.030)

## 2016-02-26 LAB — CBC WITH DIFFERENTIAL/PLATELET
BASOS PCT: 0 %
Basophils Absolute: 0 10*3/uL (ref 0.0–0.1)
EOS PCT: 0 %
Eosinophils Absolute: 0 10*3/uL (ref 0.0–0.7)
HCT: 24.8 % — ABNORMAL LOW (ref 36.0–46.0)
HEMOGLOBIN: 8.8 g/dL — AB (ref 12.0–15.0)
LYMPHS PCT: 20 %
Lymphs Abs: 1.9 10*3/uL (ref 0.7–4.0)
MCH: 35.9 pg — AB (ref 26.0–34.0)
MCHC: 35.5 g/dL (ref 30.0–36.0)
MCV: 101.2 fL — AB (ref 78.0–100.0)
MONO ABS: 1 10*3/uL (ref 0.1–1.0)
Monocytes Relative: 11 %
NEUTROS ABS: 6.6 10*3/uL (ref 1.7–7.7)
Neutrophils Relative %: 69 %
Platelets: 416 10*3/uL — ABNORMAL HIGH (ref 150–400)
RBC: 2.45 MIL/uL — ABNORMAL LOW (ref 3.87–5.11)
RDW: 21.6 % — ABNORMAL HIGH (ref 11.5–15.5)
WBC: 9.5 10*3/uL (ref 4.0–10.5)

## 2016-02-26 LAB — COMPREHENSIVE METABOLIC PANEL
ALBUMIN: 3.9 g/dL (ref 3.5–5.0)
ALK PHOS: 94 U/L (ref 38–126)
ALT: 26 U/L (ref 14–54)
AST: 44 U/L — AB (ref 15–41)
Anion gap: 6 (ref 5–15)
BUN: 5 mg/dL — ABNORMAL LOW (ref 6–20)
CALCIUM: 8.7 mg/dL — AB (ref 8.9–10.3)
CHLORIDE: 112 mmol/L — AB (ref 101–111)
CO2: 21 mmol/L — AB (ref 22–32)
CREATININE: 0.31 mg/dL — AB (ref 0.44–1.00)
GFR calc Af Amer: >60 mL/min (ref >60)
GFR calc non Af Amer: >60 mL/min (ref >60)
GLUCOSE: 104 mg/dL — AB (ref 65–99)
Potassium: 3.1 mmol/L — ABNORMAL LOW (ref 3.5–5.1)
SODIUM: 139 mmol/L (ref 135–145)
Total Bilirubin: 2.1 mg/dL — ABNORMAL HIGH (ref 0.3–1.2)
Total Protein: 7.2 g/dL (ref 6.5–8.1)

## 2016-02-26 LAB — RETICULOCYTES
RBC.: 2.45 MIL/uL — ABNORMAL LOW (ref 3.87–5.11)
RETIC CT PCT: 15 % — AB (ref 0.4–3.1)
Retic Count, Absolute: 367.5 10*3/uL — ABNORMAL HIGH (ref 19.0–186.0)

## 2016-02-26 MED ORDER — SODIUM CHLORIDE 0.9% FLUSH
10.0000 mL | INTRAVENOUS | Status: DC | PRN
  Administered 2016-02-26: 10 mL

## 2016-02-26 MED ORDER — SODIUM CHLORIDE 0.9% FLUSH: 9.0000 mL | INTRAVENOUS | Status: DC | PRN

## 2016-02-26 MED ORDER — HEPARIN SOD (PORK) LOCK FLUSH 100 UNIT/ML IV SOLN
250.0000 [IU] | INTRAVENOUS | Status: DC | PRN
  Administered 2016-02-26: 250 [IU]
  Filled 2016-02-26 (×2): qty 5

## 2016-02-26 MED ORDER — KETOROLAC TROMETHAMINE 30 MG/ML IJ SOLN
30.0000 mg | Freq: Once | INTRAMUSCULAR | Status: AC
  Administered 2016-02-26: 30 mg via INTRAVENOUS
  Filled 2016-02-26: qty 1

## 2016-02-26 MED ORDER — ONDANSETRON HCL 4 MG/2ML IJ SOLN: 4.0000 mg | Freq: Four times a day (QID) | INTRAMUSCULAR | Status: DC | PRN

## 2016-02-26 MED ORDER — DEXTROSE-NACL 5-0.45 % IV SOLN
INTRAVENOUS | Status: DC
  Administered 2016-02-26: 10:00:00 via INTRAVENOUS

## 2016-02-26 MED ORDER — HYDROMORPHONE 1 MG/ML IV SOLN
INTRAVENOUS | Status: DC
  Administered 2016-02-26: 12 mg via INTRAVENOUS
  Administered 2016-02-26: 2 mg via INTRAVENOUS
  Administered 2016-02-26: 10:00:00 via INTRAVENOUS
  Administered 2016-02-26: 0.5 mg via INTRAVENOUS
  Filled 2016-02-26: qty 25

## 2016-02-26 MED ORDER — NALOXONE HCL 0.4 MG/ML IJ SOLN: 0.4000 mg | INTRAMUSCULAR | Status: DC | PRN

## 2016-02-26 MED ORDER — DIPHENHYDRAMINE HCL 25 MG PO CAPS
25.0000 mg | ORAL_CAPSULE | ORAL | Status: DC | PRN
  Administered 2016-02-26: 50 mg via ORAL
  Filled 2016-02-26: qty 2

## 2016-02-26 MED ORDER — SODIUM CHLORIDE 0.9 % IV SOLN
25.0000 mg | INTRAVENOUS | Status: DC | PRN
  Filled 2016-02-26: qty 0.5

## 2016-02-26 NOTE — Telephone Encounter (Signed)
Pt called [facility] requesting to come for treatment. Pt states her pain is 8/10 and that it is in her hips, ribs and back. Took her last pain med at 6am, oxy and ms contin. She stated she had taken her meds as scheduled around the clock. She denies fever, abd pain, vomiting, diarrhea or chest pain. She stated that she had been nauseated. Will check with the provider and give her a call back. Pt voiced understanding.

## 2016-02-26 NOTE — Telephone Encounter (Signed)
Called patient back after speaking with the provider that she could come in for treatment. Pt voiced understanding and stated that she could be here in about an hour.

## 2016-02-26 NOTE — Discharge Summary (Signed)
[facility] Discharge Summary   Patient ID: Brittany Foster MRN: [id number] DOB/AGE: 31/03/1985 31 y.o.  Admit date: 02/26/2016 Discharge date: 02/26/2016  Primary Care Physician:  Jeanann LewandowskyJEGEDE, OLUGBEMIGA, MD  Admission Diagnoses:  Active Problems:   Sickle cell anemia with pain [facility](HCC)   Discharge Medications:  Allergies as of 02/26/2016      Reactions   Ultram [tramadol] Other (See Comments)   seizures   Zofran [ondansetron Hcl] Nausea And Vomiting   Buprenorphine Hcl Hives, Rash   Shaking Tolerates Percocet, Norco, and buprenorphine   Morphine And Related Hives, Rash, Other (See Comments)   Shaking Tolerates Percocet, Norco, Dilaudid, and buprenorphine   Tape Rash      Medication List    TAKE these medications   ARIPiprazole 5 MG tablet Commonly known as:  ABILIFY Take 1 tablet (5 mg total) by mouth 2 (two) times daily.   ciprofloxacin 250 MG tablet Commonly known as:  CIPRO Take 1 tablet (250 mg total) by mouth 2 (two) times daily.   DULoxetine 60 MG capsule Commonly known as:  CYMBALTA TAKE 1 CAPSULE BY MOUTH DAILY.   folic acid 1 MG tablet Commonly known as:  FOLVITE Take 1 tablet (1 mg total) by mouth daily.   gabapentin 300 MG capsule Commonly known as:  NEURONTIN TAKE 1 CAPSULE BY MOUTH 3 TIMES DAILY.   hydroxyurea 500 MG capsule Commonly known as:  HYDREA TAKE 2 CAPSULES BY MOUTH DAILY. MAY TAKE WITH FOOD TO MINIMIZE GI SIDE EFFECTS.   ibuprofen 200 MG tablet Commonly known as:  ADVIL,MOTRIN Take 400 mg by mouth every 6 (six) hours as needed for moderate pain.   morphine 30 MG 12 hr tablet Commonly known as:  MS CONTIN TAKE ONE TABLET BY MOUTH EVERY TWELVE HOURS   oxyCODONE-acetaminophen 10-325 MG tablet Commonly known as:  PERCOCET TAKE ONE TABLET EVERY 4 HOURS AS NEEDED FOR pain   potassium chloride SA 20 MEQ tablet Commonly known as:  K-DUR,KLOR-CON Take 2 tablets (40 mEq total) by mouth daily.   Topiramate ER 100 MG  Cp24 Take 100 mg by mouth at bedtime.        Consults:  None  Significant Diagnostic Studies:  No results found.   [facility] Course:  Brittany CampbellMiranda Foster, a 30 year old female was admitted to the day infusion center for pain related to sickle cell anemia. Reviewed labs, stable. Patient was started on high concentration PCA due to opiate tolerance. She used a total of 14.5 mg with 30 demands and 29 deliveries. Pain intensity decreased from 9/10 to 6/10. Patient can manage at home on current medication regimen.  Patient is alert, oriented, and ambulatory. She will discharge home in stable condition.  Recommend that patient follow up in clinic as previously scheduled with Dr. Hyman HopesJegede. She expressed understanding.   The patient was given clear instructions to go to ER or return to medical center if symptoms do not improve, worsen or new problems develop. The patient verbalized understanding.   Physical Exam at Discharge:   BP 106/67 (BP Location: Right Arm)   Pulse (!) 105   Temp 98.7 F (37.1 C) (Oral)   Resp (!) 23   Ht 5' (1.524 m)   Wt 130 lb (59 kg)   LMP 02/07/2016 (Exact Date)   SpO2 94%   BMI 25.39 kg/m   General Appearance:    Alert, cooperative, no distress, appears stated age  Head:    Normocephalic, without obvious abnormality, atraumatic  Eyes:    PERRL, conjunctiva/corneas clear, EOM's intact, fundi    benign, both eyes  Back:     Symmetric, no curvature, ROM normal, no CVA tenderness  Lungs:     Clear to auscultation bilaterally, respirations unlabored  Chest Wall:    No tenderness or deformity   Heart:    Regular rate and rhythm, S1 and S2 normal, no murmur, rub   or gallop  Extremities:   Extremities normal, atraumatic, no cyanosis or edema  Pulses:   2+ and symmetric all extremities  Skin:   Skin color, texture, turgor normal, no rashes or lesions    Disposition at Discharge: 01-Home or Self Care  Discharge Orders: Discharge Instructions     Discharge patient    Complete by:  As directed       Condition at Discharge:   Stable  Time spent on Discharge:  15 minutes  Signed: Jericka Kadar M 02/26/2016, 3:17 PM

## 2016-02-26 NOTE — Progress Notes (Signed)
Patient ID: Brittany CampbellMiranda Gurry, female   DOB: 04/17/1985, 30 y.o.   MRN: [id number]  Discharge instructions given to patient.  Line flushed per orders.  Belongings returned.  Patient instructed to go to primary care office side to pick up prescriptions.  Patient verbalizes understanding.

## 2016-02-26 NOTE — H&P (Signed)
[facility] History and Physical   Date: 02/26/2016  Patient name: Brittany Foster Medical record number: [id number] Date of birth: [birth year] Age: 30 y.o. Gender: female PCP: Jeanann Lewandowsky, MD  Attending physician: Quentin Angst, MD  Chief Complaint: Generalized pain  History of Present Illness: [name], a 30 year old female with a history of sickle cell anemia, HbSS presents to the day hospital with generalized pain. Brissia is opiate tolerant. She has had frequent hospitalizations over the past several months.  Pain is consistent with sickle cell anemia. Her current pain intensity is 9/10 described as constant and throbbing. She last had MS Contin and Oxycontin this am without sustained relief. She denies fever, headache, chest pain, SOB, dysuria, nausea, vomiting, or diarrhea.   Meds: Prescriptions Prior to Admission  Medication Sig Dispense Refill Last Dose  . ARIPiprazole (ABILIFY) 5 MG tablet Take 1 tablet (5 mg total) by mouth 2 (two) times daily. 60 tablet 3 02/26/2016 at Unknown time  . DULoxetine (CYMBALTA) 60 MG capsule TAKE 1 CAPSULE BY MOUTH DAILY. 30 capsule 3 02/26/2016 at Unknown time  . folic acid (FOLVITE) 1 MG tablet Take 1 tablet (1 mg total) by mouth daily. 90 tablet 3 Past Week at Unknown time  . hydroxyurea (HYDREA) 500 MG capsule TAKE 2 CAPSULES BY MOUTH DAILY. MAY TAKE WITH FOOD TO MINIMIZE GI SIDE EFFECTS. 60 capsule 3 02/26/2016 at Unknown time  . ibuprofen (ADVIL,MOTRIN) 200 MG tablet Take 400 mg by mouth every 6 (six) hours as needed for moderate pain.   02/26/2016 at Unknown time  . morphine (MS CONTIN) 30 MG 12 hr tablet TAKE ONE TABLET BY MOUTH EVERY TWELVE HOURS 60 tablet 0 02/26/2016 at Unknown time  . oxyCODONE-acetaminophen (PERCOCET) 10-325 MG tablet TAKE ONE TABLET EVERY 4 HOURS AS NEEDED FOR pain 90 tablet 0 02/26/2016 at Unknown time  . Topiramate ER 100 MG CP24 Take 100 mg by mouth at bedtime.   Past Week at Unknown  time  . ciprofloxacin (CIPRO) 250 MG tablet Take 1 tablet (250 mg total) by mouth 2 (two) times daily. 6 tablet 0   . gabapentin (NEURONTIN) 300 MG capsule TAKE 1 CAPSULE BY MOUTH 3 TIMES DAILY. (Patient not taking: Reported on 02/13/2016) 90 capsule 3 Not Taking at Unknown time  . potassium chloride SA (K-DUR,KLOR-CON) 20 MEQ tablet Take 2 tablets (40 mEq total) by mouth daily. (Patient not taking: Reported on 02/13/2016) 30 tablet 1 Not Taking at Unknown time    Allergies: Ultram [tramadol]; Zofran [ondansetron hcl]; Buprenorphine hcl; Morphine and related; and Tape Past Medical History:  Diagnosis Date  . Anemia   . Depression, major, recurrent (HCC)   . Migraines   . Sickle cell anemia (HCC)    Past Surgical History:  Procedure Laterality Date  . CESAREAN SECTION    . CHOLECYSTECTOMY  2000  . IR GENERIC HISTORICAL  10/08/2015   IR US GUIDE VASC ACCESS RIGHT 10/08/2015 Simonne Come, MD [facility]  . IR GENERIC HISTORICAL  10/08/2015   IR FLUORO GUIDE CV LINE RIGHT 10/08/2015 Simonne Come, MD [facility]  . MULTIPLE TOOTH EXTRACTIONS N/A   . port a cath placement Right    about 6-7 years ago  . removal of porta cath Right 09/11/15  . TUBAL LIGATION     Family History  Problem Relation Age of Onset  . Sickle cell trait Father   . Sickle cell trait Mother   . Sickle cell anemia Other  Social History   Social History  . Marital status: Single    Spouse name: N/A  . Number of children: N/A  . Years of education: N/A   Occupational History  . Not on file.   Social History Main Topics  . Smoking status: Former Games developermoker  . Smokeless tobacco: Never Used  . Alcohol use No  . Drug use: No  . Sexual activity: Yes    Partners: Male    Birth control/ protection: Injection     Comment: 1 new partner recently, no concern about STI, uses condoms   Other Topics Concern  . Not on file   Social History Narrative  . No narrative on file    Review of Systems: Constitutional:  negative for fatigue Eyes: negative Ears, nose, mouth, throat, and face: negative Respiratory: negative Cardiovascular: negative Gastrointestinal: negative Genitourinary:negative Integument/breast: negative Hematologic/lymphatic: negative Musculoskeletal:positive for back pain and myalgias Neurological: negative Behavioral/Psych: negative Endocrine: negative Allergic/Immunologic: negative  Physical Exam: Blood pressure 126/79, pulse (!) 106, temperature 98.7 F (37.1 C), temperature source Oral, resp. rate 20, height 5' (1.524 m), weight 130 lb (59 kg), last menstrual period 02/07/2016, SpO2 100 %. BP 126/79 (BP Location: Left Arm, Patient Position: Sitting)   Pulse (!) 106   Temp 98.7 F (37.1 C) (Oral)   Resp 20   Ht 5' (1.524 m)   Wt 130 lb (59 kg)   LMP 02/07/2016 (Exact Date)   SpO2 100%   BMI 25.39 kg/m   General Appearance:    Alert, cooperative, mild distress, appears stated age  Head:    Normocephalic, without obvious abnormality, atraumatic  Eyes:    PERRL, conjunctiva/corneas clear, EOM's intact, fundi    benign, both eyes  Ears:    Normal TM's and external ear canals, both ears  Nose:   Nares normal, septum midline, mucosa normal, no drainage    or sinus tenderness  Throat:   Lips, mucosa, and tongue normal; partially edentulous  Neck:   Supple, symmetrical, trachea midline, no adenopathy;    thyroid:  no enlargement/tenderness/nodules; no carotid   bruit or JVD  Back:     Symmetric, no curvature, ROM normal, no CVA tenderness  Lungs:     Clear to auscultation bilaterally, respirations unlabored  Chest Wall:    No tenderness or deformity   Heart:    Regular rate and rhythm, S1 and S2 normal, no murmur, rub   or gallop  Abdomen:     Soft, non-tender, bowel sounds active all four quadrants,    no masses, no organomegaly  Extremities:   Extremities normal, atraumatic, no cyanosis or edema  Pulses:   2+ and symmetric all extremities  Skin:   Skin color,  texture, turgor normal, no rashes or lesions  Lymph nodes:   Cervical, supraclavicular, and axillary nodes normal  Neurologic:   CNII-XII intact, normal strength, sensation and reflexes    throughout     Lab results:No results found for this or any previous visit (from the past 24 hour(s)).  Imaging results:  No results found.   Assessment & Plan:  Patient will be admitted to the day infusion center for extended observation  Start IV D5.45 for cellular rehydration at 125/hr  Start Toradol 30 mg IV every 6 hours for inflammation.  Start Dilaudid PCA High Concentration per weight based protocol.   Patient will be re-evaluated for pain intensity in the context of function and relationship to baseline as care progresses.  If no significant pain relief,  will transfer patient to inpatient services for a higher level of care.   Will check CMP, UA,  CBC w/differential, and reticulocytes   Autum Benfer M 02/26/2016, 9:12 AM

## 2016-02-27 NOTE — Telephone Encounter (Signed)
Opened in error

## 2016-02-27 NOTE — Telephone Encounter (Signed)
See previous note

## 2016-02-28 ENCOUNTER — Telehealth: Payer: Self-pay | Admitting: Family Medicine

## 2016-02-28 NOTE — Telephone Encounter (Signed)
[name], a 30 year old female with a history of sickle cell anemia, HbSS is frequently seen in clinic for chronic pain related to sickle cell anemia. She also has frequent hospitalizations and clinic visits for acute/chronic generalized pain. She has also been to pain management. She is opiate tolerant and typically takes increased doses of opiate medications.  [facility]. Intake is on Tuesday and Thursday mornings at 5 am. Patient expressed understanding of intake process.   [facility] [address] [place], [place] [address] [phone]     Massie MaroonHollis,[name], FNP

## 2016-02-28 NOTE — Telephone Encounter (Signed)
Opened in error

## 2016-03-03 ENCOUNTER — Telehealth (HOSPITAL_COMMUNITY): Payer: Self-pay | Admitting: *Deleted

## 2016-03-03 NOTE — Telephone Encounter (Signed)
Pt called asking if she [facility] for treatment. Stated that she did not feel good and that her head had been hurting and just felt bad all over. Pt advised to go to the ED to be assessed. Pt voiced understanding.

## 2016-03-03 NOTE — Telephone Encounter (Signed)
Encounter closed

## 2016-03-04 LAB — DRUG SCREEN (9) + CREATININE, UR
Amphetamine Screen, Urine: NEGATIVE ng/mL
BENZODIAZ UR QL: NEGATIVE ng/mL
Barbiturate Quant, Ur: NEGATIVE ng/mL
CREATININE, RANDOM U: 59.7 mg/dL (ref 20.0–300.0)
Cocaine (Metab.), Urine: NEGATIVE ng/mL
METHADONE SCREEN, URINE: NEGATIVE ng/mL
Oxycodone+Oxymorphone Ur Ql Scn: NEGATIVE ng/mL
PCP, Urine: NEGATIVE ng/mL
PH OF URINE: 5.6 (ref 4.5–8.9)
PROPOXYPHENE: NEGATIVE ng/mL

## 2016-03-04 LAB — OPIATES CONFIRMATION, URINE
CODEINE: NEGATIVE
HYDROCODONE: POSITIVE — AB
Hydrocodone: 342 ng/mL
Hydromorphone gc/ms, Urine: >3000 ng/mL
Hydromorphone: POSITIVE — AB
Morphine, Confirm: 1370 ng/mL
Morphine: POSITIVE — AB
Opiates: POSITIVE — AB

## 2016-03-05 ENCOUNTER — Observation Stay
Admission: EM | Admit: 2016-03-05 | Discharge: 2016-03-06 | Disposition: A | Discharge status: Home / Self Care | Payer: Medicaid Other | Attending: Internal Medicine | Admitting: Internal Medicine

## 2016-03-05 ENCOUNTER — Telehealth (HOSPITAL_COMMUNITY): Payer: Self-pay | Admitting: *Deleted

## 2016-03-05 ENCOUNTER — Encounter: Payer: Self-pay | Admitting: Emergency Medicine

## 2016-03-05 ENCOUNTER — Emergency Department: Payer: Medicaid Other

## 2016-03-05 DIAGNOSIS — D5700 Hb-SS disease with crisis, unspecified: Secondary | ICD-10-CM | POA: Diagnosis not present

## 2016-03-05 DIAGNOSIS — Z79899 Other long term (current) drug therapy: Secondary | ICD-10-CM | POA: Insufficient documentation

## 2016-03-05 DIAGNOSIS — R079 Chest pain, unspecified: Secondary | ICD-10-CM | POA: Insufficient documentation

## 2016-03-05 DIAGNOSIS — F329 Major depressive disorder, single episode, unspecified: Secondary | ICD-10-CM | POA: Insufficient documentation

## 2016-03-05 DIAGNOSIS — E876 Hypokalemia: Secondary | ICD-10-CM | POA: Diagnosis not present

## 2016-03-05 DIAGNOSIS — G894 Chronic pain syndrome: Secondary | ICD-10-CM | POA: Diagnosis not present

## 2016-03-05 LAB — COMPREHENSIVE METABOLIC PANEL
ALBUMIN: 4 g/dL (ref 3.5–5.0)
ALT: 24 U/L (ref 14–54)
AST: 47 U/L — AB (ref 15–41)
Alkaline Phosphatase: 94 U/L (ref 38–126)
Anion gap: 5 (ref 5–15)
BILIRUBIN TOTAL: 1.9 mg/dL — AB (ref 0.3–1.2)
BUN: 8 mg/dL (ref 6–20)
CHLORIDE: 108 mmol/L (ref 101–111)
CO2: 26 mmol/L (ref 22–32)
Calcium: 8.8 mg/dL — ABNORMAL LOW (ref 8.9–10.3)
Creatinine, Ser: 0.34 mg/dL — ABNORMAL LOW (ref 0.44–1.00)
GFR calc Af Amer: >60 mL/min (ref >60)
GFR calc non Af Amer: >60 mL/min (ref >60)
GLUCOSE: 82 mg/dL (ref 65–99)
POTASSIUM: 3.2 mmol/L — AB (ref 3.5–5.1)
Sodium: 139 mmol/L (ref 135–145)
Total Protein: 7.6 g/dL (ref 6.5–8.1)

## 2016-03-05 LAB — RETICULOCYTES
RBC.: 2.42 MIL/uL — ABNORMAL LOW (ref 3.80–5.20)
Retic Count, Absolute: 142.8 10*3/uL (ref 19.0–183.0)
Retic Ct Pct: 5.9 % — ABNORMAL HIGH (ref 0.4–3.1)

## 2016-03-05 LAB — CBC WITH DIFFERENTIAL/PLATELET
BASOS ABS: 0.4 10*3/uL — AB (ref 0–0.1)
Band Neutrophils: 3 %
Basophils Relative: 3 %
Blasts: 0 %
EOS PCT: 1 %
Eosinophils Absolute: 0.1 10*3/uL (ref 0–0.7)
HEMATOCRIT: 25 % — AB (ref 35.0–47.0)
Hemoglobin: 8.7 g/dL — ABNORMAL LOW (ref 12.0–16.0)
Lymphocytes Relative: 30 %
Lymphs Abs: 4.4 10*3/uL — ABNORMAL HIGH (ref 1.0–3.6)
MCH: 36 pg — ABNORMAL HIGH (ref 26.0–34.0)
MCHC: 34.8 g/dL (ref 32.0–36.0)
MCV: 103.4 fL — ABNORMAL HIGH (ref 80.0–100.0)
METAMYELOCYTES PCT: 0 %
MYELOCYTES: 0 %
Monocytes Absolute: 1.5 10*3/uL — ABNORMAL HIGH (ref 0.2–0.9)
Monocytes Relative: 10 %
Neutro Abs: 8.3 10*3/uL — ABNORMAL HIGH (ref 1.4–6.5)
Neutrophils Relative %: 53 %
Other: 0 %
PLATELETS: 446 10*3/uL — AB (ref 150–440)
PROMYELOCYTES ABS: 0 %
RBC: 2.42 MIL/uL — AB (ref 3.80–5.20)
RDW: 22.9 % — ABNORMAL HIGH (ref 11.5–14.5)
WBC: 14.7 10*3/uL — AB (ref 3.6–11.0)
nRBC: 3 /100 WBC — ABNORMAL HIGH

## 2016-03-05 LAB — TROPONIN I: Troponin I: <0.03 ng/mL (ref <0.03)

## 2016-03-05 NOTE — Telephone Encounter (Signed)
Patient called seeking treatment c/o generalized pain and rates it 8/10. Patient denies fever, chest pain, N/V/D or abdominal pain. Per patient, she has taken her home medication Oxycodone 10 mg and Advil this morning with no relief. Will check with the provider and place patient on hold. Patient voiced understanding.   [facility] with [facility] patient advised to stay home stay warm and hydrated also to take home medication around the clock. If patient develops chest pain or SOB patient advised to go to the ER. Patient states an understanding.

## 2016-03-05 NOTE — Telephone Encounter (Signed)
Patient called requesting to speak with L. [name] patient stated it was personal and only wanted to speak with NP. L. [name] notified.

## 2016-03-05 NOTE — ED Triage Notes (Signed)
Pt c/o pain to chest, legs and hips. Reports sickle pain crisis. Denies having fevers. Pain meds at home not working.

## 2016-03-06 ENCOUNTER — Other Ambulatory Visit: Payer: Self-pay | Admitting: Family Medicine

## 2016-03-06 ENCOUNTER — Inpatient Hospital Stay (HOSPITAL_COMMUNITY)
Admission: AD | Admit: 2016-03-06 | Discharge: 2016-03-08 | DRG: 812 | Disposition: A | Discharge status: Home / Self Care | Payer: Medicaid Other | Source: Other Acute Inpatient Hospital | Attending: Internal Medicine | Admitting: Internal Medicine

## 2016-03-06 ENCOUNTER — Inpatient Hospital Stay (HOSPITAL_COMMUNITY)
Admission: AD | Admit: 2016-03-06 | Payer: Medicaid Other | Source: Other Acute Inpatient Hospital | Admitting: Family Medicine

## 2016-03-06 ENCOUNTER — Encounter: Payer: Self-pay | Admitting: *Deleted

## 2016-03-06 DIAGNOSIS — Z87891 Personal history of nicotine dependence: Secondary | ICD-10-CM | POA: Diagnosis not present

## 2016-03-06 DIAGNOSIS — D72829 Elevated white blood cell count, unspecified: Secondary | ICD-10-CM | POA: Diagnosis present

## 2016-03-06 DIAGNOSIS — Z79899 Other long term (current) drug therapy: Secondary | ICD-10-CM | POA: Diagnosis not present

## 2016-03-06 DIAGNOSIS — F1120 Opioid dependence, uncomplicated: Secondary | ICD-10-CM | POA: Diagnosis present

## 2016-03-06 DIAGNOSIS — F332 Major depressive disorder, recurrent severe without psychotic features: Secondary | ICD-10-CM | POA: Diagnosis present

## 2016-03-06 DIAGNOSIS — G894 Chronic pain syndrome: Secondary | ICD-10-CM | POA: Diagnosis present

## 2016-03-06 DIAGNOSIS — Z832 Family history of diseases of the blood and blood-forming organs and certain disorders involving the immune mechanism: Secondary | ICD-10-CM

## 2016-03-06 DIAGNOSIS — D5700 Hb-SS disease with crisis, unspecified: Secondary | ICD-10-CM | POA: Diagnosis present

## 2016-03-06 DIAGNOSIS — Z9049 Acquired absence of other specified parts of digestive tract: Secondary | ICD-10-CM | POA: Diagnosis not present

## 2016-03-06 LAB — BASIC METABOLIC PANEL
Anion gap: 3 — ABNORMAL LOW (ref 5–15)
BUN: 8 mg/dL (ref 6–20)
CO2: 25 mmol/L (ref 22–32)
Calcium: 8.1 mg/dL — ABNORMAL LOW (ref 8.9–10.3)
Chloride: 113 mmol/L — ABNORMAL HIGH (ref 101–111)
Creatinine, Ser: 0.43 mg/dL — ABNORMAL LOW (ref 0.44–1.00)
GFR calc non Af Amer: >60 mL/min (ref >60)
Glucose, Bld: 92 mg/dL (ref 65–99)
Potassium: 3.3 mmol/L — ABNORMAL LOW (ref 3.5–5.1)
SODIUM: 141 mmol/L (ref 135–145)

## 2016-03-06 LAB — URINALYSIS, COMPLETE (UACMP) WITH MICROSCOPIC
Bilirubin Urine: NEGATIVE
Glucose, UA: NEGATIVE mg/dL
HGB URINE DIPSTICK: NEGATIVE
KETONES UR: NEGATIVE mg/dL
LEUKOCYTES UA: NEGATIVE
NITRITE: NEGATIVE
PH: 6 (ref 5.0–8.0)
Protein, ur: NEGATIVE mg/dL
SPECIFIC GRAVITY, URINE: 1.01 (ref 1.005–1.030)

## 2016-03-06 LAB — CBC
HEMATOCRIT: 21.5 % — AB (ref 35.0–47.0)
Hemoglobin: 7.6 g/dL — ABNORMAL LOW (ref 12.0–16.0)
MCH: 36.2 pg — AB (ref 26.0–34.0)
MCHC: 35.1 g/dL (ref 32.0–36.0)
MCV: 103.1 fL — ABNORMAL HIGH (ref 80.0–100.0)
Platelets: 373 10*3/uL (ref 150–440)
RBC: 2.09 MIL/uL — ABNORMAL LOW (ref 3.80–5.20)
RDW: 23.3 % — AB (ref 11.5–14.5)
WBC: 12.7 10*3/uL — ABNORMAL HIGH (ref 3.6–11.0)

## 2016-03-06 LAB — PATHOLOGIST SMEAR REVIEW

## 2016-03-06 MED ORDER — KETOROLAC TROMETHAMINE 15 MG/ML IJ SOLN: 15.0000 mg | Freq: Four times a day (QID) | INTRAMUSCULAR | Status: DC

## 2016-03-06 MED ORDER — TOPIRAMATE ER 100 MG PO CAP24: 100.0000 mg | ORAL_CAPSULE | Freq: Every day | ORAL | Status: DC

## 2016-03-06 MED ORDER — HYDROMORPHONE HCL 1 MG/ML IJ SOLN
0.5000 mg | INTRAMUSCULAR | Status: DC | PRN
  Administered 2016-03-06: 0.5 mg via INTRAVENOUS
  Filled 2016-03-06: qty 1

## 2016-03-06 MED ORDER — HYDROXYUREA 500 MG PO CAPS
500.0000 mg | ORAL_CAPSULE | Freq: Every day | ORAL | Status: DC
  Administered 2016-03-07 – 2016-03-08 (×2): 500 mg via ORAL
  Filled 2016-03-06 (×2): qty 1

## 2016-03-06 MED ORDER — DULOXETINE HCL 60 MG PO CPEP
60.0000 mg | ORAL_CAPSULE | Freq: Every day | ORAL | Status: DC
  Administered 2016-03-06: 60 mg via ORAL
  Filled 2016-03-06: qty 1

## 2016-03-06 MED ORDER — HYDROXYUREA 500 MG PO CAPS
1000.0000 mg | ORAL_CAPSULE | Freq: Every day | ORAL | Status: DC
  Administered 2016-03-06: 500 mg via ORAL
  Filled 2016-03-06: qty 2

## 2016-03-06 MED ORDER — HYDROMORPHONE HCL 1 MG/ML IJ SOLN
INTRAMUSCULAR | Status: AC
  Administered 2016-03-06: 1 mg via INTRAVENOUS
  Filled 2016-03-06: qty 1

## 2016-03-06 MED ORDER — HYDROMORPHONE HCL 1 MG/ML IJ SOLN
INTRAMUSCULAR | Status: AC
  Filled 2016-03-06: qty 1

## 2016-03-06 MED ORDER — ACETAMINOPHEN 325 MG PO TABS: 650.0000 mg | ORAL_TABLET | Freq: Four times a day (QID) | ORAL | Status: DC | PRN

## 2016-03-06 MED ORDER — FOLIC ACID 1 MG PO TABS
1.0000 mg | ORAL_TABLET | Freq: Every day | ORAL | Status: DC
  Administered 2016-03-06: 1 mg via ORAL
  Filled 2016-03-06: qty 1

## 2016-03-06 MED ORDER — TOPIRAMATE 25 MG PO TABS
50.0000 mg | ORAL_TABLET | Freq: Two times a day (BID) | ORAL | Status: DC
  Administered 2016-03-06: 50 mg via ORAL
  Filled 2016-03-06: qty 2

## 2016-03-06 MED ORDER — DIPHENHYDRAMINE HCL 25 MG PO CAPS
25.0000 mg | ORAL_CAPSULE | ORAL | Status: DC | PRN
Start: 2016-03-06 — End: 2016-03-06

## 2016-03-06 MED ORDER — HYDROMORPHONE HCL 1 MG/ML IJ SOLN
1.0000 mg | Freq: Once | INTRAMUSCULAR | Status: AC
  Administered 2016-03-06: 1 mg via INTRAVENOUS

## 2016-03-06 MED ORDER — ACETAMINOPHEN 325 MG PO TABS: 325.0000 mg | ORAL_TABLET | ORAL | Status: DC | PRN

## 2016-03-06 MED ORDER — SODIUM CHLORIDE 0.9% FLUSH: 9.0000 mL | INTRAVENOUS | Status: DC | PRN

## 2016-03-06 MED ORDER — KETOROLAC TROMETHAMINE 30 MG/ML IJ SOLN
30.0000 mg | Freq: Once | INTRAMUSCULAR | Status: DC
  Filled 2016-03-06: qty 1

## 2016-03-06 MED ORDER — TOPIRAMATE ER 100 MG PO CAP24
100.0000 mg | ORAL_CAPSULE | Freq: Every day | ORAL | Status: DC
  Filled 2016-03-06 (×2): qty 1

## 2016-03-06 MED ORDER — HYDROMORPHONE HCL 2 MG/ML IJ SOLN: 1.0000 mg | Freq: Once | INTRAMUSCULAR | Status: DC

## 2016-03-06 MED ORDER — SENNOSIDES-DOCUSATE SODIUM 8.6-50 MG PO TABS: 1.0000 | ORAL_TABLET | Freq: Every evening | ORAL | Status: DC | PRN

## 2016-03-06 MED ORDER — FOLIC ACID 1 MG PO TABS
1.0000 mg | ORAL_TABLET | Freq: Every day | ORAL | Status: DC
  Administered 2016-03-07 – 2016-03-08 (×2): 1 mg via ORAL
  Filled 2016-03-06 (×2): qty 1

## 2016-03-06 MED ORDER — DIPHENHYDRAMINE HCL 25 MG PO CAPS
25.0000 mg | ORAL_CAPSULE | Freq: Once | ORAL | Status: AC
  Administered 2016-03-06: 25 mg via ORAL
  Filled 2016-03-06: qty 1

## 2016-03-06 MED ORDER — NALOXONE HCL 0.4 MG/ML IJ SOLN: 0.4000 mg | INTRAMUSCULAR | Status: DC | PRN

## 2016-03-06 MED ORDER — HYDROMORPHONE HCL 2 MG PO TABS
4.0000 mg | ORAL_TABLET | Freq: Once | ORAL | Status: AC
  Administered 2016-03-06: 4 mg via ORAL
  Filled 2016-03-06: qty 2

## 2016-03-06 MED ORDER — ENOXAPARIN SODIUM 40 MG/0.4ML ~~LOC~~ SOLN
40.0000 mg | SUBCUTANEOUS | Status: DC
  Filled 2016-03-06: qty 0.4

## 2016-03-06 MED ORDER — DIPHENHYDRAMINE HCL 12.5 MG/5ML PO ELIX
12.5000 mg | ORAL_SOLUTION | Freq: Four times a day (QID) | ORAL | Status: DC | PRN
  Administered 2016-03-07: 12.5 mg via ORAL
  Filled 2016-03-06: qty 5

## 2016-03-06 MED ORDER — ACETAMINOPHEN 650 MG RE SUPP: 650.0000 mg | Freq: Four times a day (QID) | RECTAL | Status: DC | PRN

## 2016-03-06 MED ORDER — HYDROMORPHONE HCL 1 MG/ML IJ SOLN: 2.0000 mg | Freq: Once | INTRAMUSCULAR | Status: DC

## 2016-03-06 MED ORDER — ONDANSETRON HCL 4 MG/2ML IJ SOLN: 4.0000 mg | Freq: Four times a day (QID) | INTRAMUSCULAR | Status: DC | PRN

## 2016-03-06 MED ORDER — HYDROMORPHONE 1 MG/ML IV SOLN
INTRAVENOUS | Status: DC
  Administered 2016-03-06: 1 mg via INTRAVENOUS
  Filled 2016-03-06: qty 25

## 2016-03-06 MED ORDER — PROMETHAZINE HCL 25 MG PO TABS: 12.5000 mg | ORAL_TABLET | ORAL | Status: DC | PRN

## 2016-03-06 MED ORDER — ENOXAPARIN SODIUM 40 MG/0.4ML ~~LOC~~ SOLN
40.0000 mg | Freq: Every day | SUBCUTANEOUS | Status: DC
  Filled 2016-03-06: qty 0.4

## 2016-03-06 MED ORDER — KETOROLAC TROMETHAMINE 15 MG/ML IJ SOLN
15.0000 mg | Freq: Four times a day (QID) | INTRAMUSCULAR | Status: DC
  Administered 2016-03-07 (×2): 15 mg via INTRAVENOUS
  Filled 2016-03-06 (×2): qty 1

## 2016-03-06 MED ORDER — SENNOSIDES-DOCUSATE SODIUM 8.6-50 MG PO TABS
1.0000 | ORAL_TABLET | Freq: Two times a day (BID) | ORAL | Status: DC
  Administered 2016-03-07: 1 via ORAL
  Filled 2016-03-06 (×3): qty 1

## 2016-03-06 MED ORDER — ALUM & MAG HYDROXIDE-SIMETH 200-200-20 MG/5ML PO SUSP: 15.0000 mL | ORAL | Status: DC | PRN

## 2016-03-06 MED ORDER — MORPHINE SULFATE ER 30 MG PO TBCR
30.0000 mg | EXTENDED_RELEASE_TABLET | Freq: Two times a day (BID) | ORAL | Status: DC
  Administered 2016-03-06: 30 mg via ORAL
  Filled 2016-03-06: qty 2

## 2016-03-06 MED ORDER — HYDROMORPHONE HCL 1 MG/ML IJ SOLN
1.0000 mg | INTRAMUSCULAR | Status: DC | PRN
  Administered 2016-03-06 (×5): 1 mg via INTRAVENOUS
  Filled 2016-03-06 (×5): qty 1

## 2016-03-06 MED ORDER — IBUPROFEN 200 MG PO TABS: 400.0000 mg | ORAL_TABLET | Freq: Four times a day (QID) | ORAL | Status: DC | PRN

## 2016-03-06 MED ORDER — POTASSIUM CHLORIDE CRYS ER 20 MEQ PO TBCR
40.0000 meq | EXTENDED_RELEASE_TABLET | Freq: Every day | ORAL | Status: DC
  Administered 2016-03-06: 40 meq via ORAL
  Filled 2016-03-06: qty 2

## 2016-03-06 MED ORDER — SODIUM CHLORIDE 0.9 % IV SOLN
25.0000 mg | INTRAVENOUS | Status: DC | PRN
  Filled 2016-03-06: qty 0.5

## 2016-03-06 MED ORDER — SODIUM CHLORIDE 0.9 % IV SOLN
INTRAVENOUS | Status: DC
  Administered 2016-03-06 (×2): via INTRAVENOUS

## 2016-03-06 MED ORDER — OXYCODONE-ACETAMINOPHEN 10-325 MG PO TABS
1.0000 | ORAL_TABLET | ORAL | Status: DC | PRN
Start: 2016-03-06 — End: 2016-03-06

## 2016-03-06 MED ORDER — ARIPIPRAZOLE 5 MG PO TABS
5.0000 mg | ORAL_TABLET | Freq: Two times a day (BID) | ORAL | Status: DC
  Administered 2016-03-07 – 2016-03-08 (×4): 5 mg via ORAL
  Filled 2016-03-06 (×7): qty 1

## 2016-03-06 MED ORDER — PROMETHAZINE HCL 25 MG RE SUPP: 12.5000 mg | RECTAL | Status: DC | PRN

## 2016-03-06 MED ORDER — OXYCODONE HCL 5 MG PO TABS
10.0000 mg | ORAL_TABLET | ORAL | Status: DC | PRN
  Administered 2016-03-06: 10 mg via ORAL
  Filled 2016-03-06: qty 2

## 2016-03-06 MED ORDER — ARIPIPRAZOLE 5 MG PO TABS
5.0000 mg | ORAL_TABLET | Freq: Two times a day (BID) | ORAL | Status: DC
  Administered 2016-03-06: 5 mg via ORAL
  Filled 2016-03-06 (×3): qty 1

## 2016-03-06 MED ORDER — HYDROMORPHONE 1 MG/ML IV SOLN
INTRAVENOUS | Status: DC
  Filled 2016-03-06: qty 25

## 2016-03-06 MED ORDER — DULOXETINE HCL 60 MG PO CPEP
60.0000 mg | ORAL_CAPSULE | Freq: Every day | ORAL | Status: DC
  Administered 2016-03-07 – 2016-03-08 (×2): 60 mg via ORAL
  Filled 2016-03-06 (×2): qty 1

## 2016-03-06 MED ORDER — DIPHENHYDRAMINE HCL 50 MG/ML IJ SOLN: 12.5000 mg | Freq: Four times a day (QID) | INTRAMUSCULAR | Status: DC | PRN

## 2016-03-06 MED ORDER — MORPHINE SULFATE ER 30 MG PO TBCR
30.0000 mg | EXTENDED_RELEASE_TABLET | Freq: Two times a day (BID) | ORAL | Status: DC
  Administered 2016-03-06: 30 mg via ORAL
  Filled 2016-03-06: qty 1

## 2016-03-06 MED ORDER — POLYETHYLENE GLYCOL 3350 17 G PO PACK
17.0000 g | PACK | Freq: Every day | ORAL | Status: DC | PRN
Start: 2016-03-06 — End: 2016-03-08

## 2016-03-06 NOTE — H&P (Signed)
[facility] [place] [facility]   PATIENT NAME: Brittany CampbellMiranda Foster    MR#:  [id number]  DATE OF BIRTH:  03/13/1985  DATE OF ADMISSION:  03/05/2016  PRIMARY CARE PHYSICIAN: Jeanann LewandowskyJEGEDE, OLUGBEMIGA, MD   REQUESTING/REFERRING PHYSICIAN:   CHIEF COMPLAINT:   Chief Complaint  Patient presents with  . Sickle Cell Pain Crisis    HISTORY OF PRESENT ILLNESS: Brittany Foster  is a 31 y.o. female with a known history of sickle cell disease, migraines, depression presented to the emergency room with pain over the legs upper extremities shoulder and hips. Patient is sharp stabbing pain 10 out of 10 on a scale of 1-10. No complaints of any fever or chills. No complaints of any cough. Also has some pain in the rib cage underneath both breasts which is sharp in nature. [facility]. Chest x-ray did not reveal any pneumonia. Hospitalist service was consulted for further care of sickle cell crisis and pain management. No history of recent travel or sick contacts at home. No complaints of any headache, dizziness and blurry vision.  PAST MEDICAL HISTORY:   Past Medical History:  Diagnosis Date  . Anemia   . Depression, major, recurrent (HCC)   . Migraines   . Sickle cell anemia (HCC)     PAST SURGICAL HISTORY: Past Surgical History:  Procedure Laterality Date  . CESAREAN SECTION    . CHOLECYSTECTOMY  2000  . IR GENERIC HISTORICAL  10/08/2015   IR US GUIDE VASC ACCESS RIGHT 10/08/2015 Simonne ComeJohn Watts, MD [facility]INTER[facility]  . IR GENERIC HISTORICAL  10/08/2015   IR FLUORO GUIDE CV LINE RIGHT 10/08/2015 Simonne ComeJohn Watts, MD [facility]INTER[facility]  . MULTIPLE TOOTH EXTRACTIONS N/A   . port a cath placement Right    about 6-7 years ago  . removal of porta cath Right 09/11/15  . TUBAL LIGATION      SOCIAL HISTORY:  Social History  Substance Use Topics  . Smoking status: Former Games developermoker  . Smokeless tobacco: Never Used  . Alcohol use No    FAMILY  HISTORY:  Family History  Problem Relation Age of Onset  . Sickle cell trait Father   . Sickle cell trait Mother   . Sickle cell anemia Other     DRUG ALLERGIES:  Allergies  Allergen Reactions  . Ultram [Tramadol] Other (See Comments)    seizures  . Zofran [Ondansetron Hcl] Nausea And Vomiting  . Buprenorphine Hcl Hives and Rash    Shaking Tolerates Percocet, Norco, and buprenorphine  . Morphine And Related Hives, Rash and Other (See Comments)    Shaking Tolerates Percocet, Norco, Dilaudid, and buprenorphine  . Tape Rash    REVIEW OF SYSTEMS:   CONSTITUTIONAL: No fever, fatigue or weakness.  EYES: No blurred or double vision.  EARS, NOSE, AND THROAT: No tinnitus or ear pain.  RESPIRATORY: No cough, shortness of breath, wheezing or hemoptysis.  CARDIOVASCULAR: No chest pain, orthopnea, edema.  GASTROINTESTINAL: No nausea, vomiting, diarrhea or abdominal pain.  GENITOURINARY: No dysuria, hematuria.  ENDOCRINE: No polyuria, nocturia,  HEMATOLOGY: No anemia, easy bruising or bleeding SKIN: No rash or lesion. MUSCULOSKELETAL: Has pain over the shoulders, bilateral legs and hip area. Rib cage pain underneath both breasts. NEUROLOGIC: No tingling, numbness, weakness.  PSYCHIATRY: No anxiety or depression.   MEDICATIONS AT HOME:  Prior to Admission medications   Medication Sig Start Date End Date Taking? Authorizing Provider  ARIPiprazole (ABILIFY) 5 MG  tablet Take 1 tablet (5 mg total) by mouth 2 (two) times daily. 01/12/16   Quentin Angst, MD  ciprofloxacin (CIPRO) 250 MG tablet Take 1 tablet (250 mg total) by mouth 2 (two) times daily. 02/17/16   Altha Harm, MD  DULoxetine (CYMBALTA) 60 MG capsule TAKE 1 CAPSULE BY MOUTH DAILY. 11/27/15   Quentin Angst, MD  folic acid (FOLVITE) 1 MG tablet Take 1 tablet (1 mg total) by mouth daily. 01/17/16   Quentin Angst, MD  gabapentin (NEURONTIN) 300 MG capsule TAKE 1 CAPSULE BY MOUTH 3 TIMES DAILY. Patient not  taking: Reported on 02/13/2016 11/27/15   Quentin Angst, MD  hydroxyurea (HYDREA) 500 MG capsule TAKE 2 CAPSULES BY MOUTH DAILY. MAY TAKE WITH FOOD TO MINIMIZE GI SIDE EFFECTS. 08/08/15   [name], FNP  ibuprofen (ADVIL,MOTRIN) 200 MG tablet Take 400 mg by mouth every 6 (six) hours as needed for moderate pain.    Historical Provider, MD  morphine (MS CONTIN) 30 MG 12 hr tablet TAKE ONE TABLET BY MOUTH EVERY TWELVE HOURS 02/10/16   Quentin Angst, MD  oxyCODONE-acetaminophen (PERCOCET) 10-325 MG tablet TAKE ONE TABLET EVERY 4 HOURS AS NEEDED FOR pain 02/23/16   Quentin Angst, MD  potassium chloride SA (K-DUR,KLOR-CON) 20 MEQ tablet Take 2 tablets (40 mEq total) by mouth daily. Patient not taking: Reported on 02/13/2016 11/23/15   Altha Harm, MD  Topiramate ER 100 MG CP24 Take 100 mg by mouth at bedtime.    Historical Provider, MD      PHYSICAL EXAMINATION:   VITAL SIGNS: Blood pressure 107/73, pulse (!) 101, temperature 98.9 F (37.2 C), temperature source Oral, resp. rate 18, height 5' (1.524 m), weight 59 kg (130 lb), last menstrual period 02/07/2016, SpO2 100 %.  GENERAL:  31 y.o.-year-old patient lying in the bed with no acute distress.  EYES: Pupils equal, round, reactive to light and accommodation. No scleral icterus. Extraocular muscles intact.  HEENT: Head atraumatic, normocephalic. Oropharynx dry and nasopharynx clear.  NECK:  Supple, no jugular venous distention. No thyroid enlargement, no tenderness.  LUNGS: Normal breath sounds bilaterally, no wheezing, rales,rhonchi or crepitation. No use of accessory muscles of respiration.  CARDIOVASCULAR: S1, S2 normal. No murmurs, rubs, or gallops.  ABDOMEN: Soft, nontender, nondistended. Bowel sounds present. No organomegaly or mass.  EXTREMITIES: No pedal edema, cyanosis, or clubbing.  NEUROLOGIC: Cranial nerves II through XII are intact. Muscle strength 5/5 in all extremities. Sensation intact. Gait not  checked.  PSYCHIATRIC: The patient is alert and oriented x 3.  SKIN: No obvious rash, lesion, or ulcer.   LABORATORY PANEL:   CBC  Recent Labs Lab 03/05/16 1849  WBC 14.7*  HGB 8.7*  HCT 25.0*  PLT 446*  MCV 103.4*  MCH 36.0*  MCHC 34.8  RDW 22.9*  LYMPHSABS 4.4*  MONOABS 1.5*  EOSABS 0.1  BASOSABS 0.4*   ------------------------------------------------------------------------------------------------------------------  Chemistries   Recent Labs Lab 03/05/16 1849  NA 139  K 3.2*  CL 108  CO2 26  GLUCOSE 82  BUN 8  CREATININE 0.34*  CALCIUM 8.8*  AST 47*  ALT 24  ALKPHOS 94  BILITOT 1.9*   ------------------------------------------------------------------------------------------------------------------ estimated creatinine clearance is 81.9 mL/min (by C-G formula based on SCr of 0.34 mg/dL (L)). ------------------------------------------------------------------------------------------------------------------ No results for input(s): TSH, T4TOTAL, T3FREE, THYROIDAB in the last 72 hours.  Invalid input(s): FREET3   Coagulation profile No results for input(s): INR, PROTIME in the last 168 hours. ------------------------------------------------------------------------------------------------------------------- No  results for input(s): DDIMER in the last 72 hours. -------------------------------------------------------------------------------------------------------------------  Cardiac Enzymes  Recent Labs Lab 03/05/16 1849  TROPONINI <0.03   ------------------------------------------------------------------------------------------------------------------ Invalid input(s): POCBNP  ---------------------------------------------------------------------------------------------------------------  Urinalysis    Component Value Date/Time   COLORURINE YELLOW 02/26/2016 1253   APPEARANCEUR CLEAR 02/26/2016 1253   LABSPEC 1.013 02/26/2016 1253   PHURINE 5.0  02/26/2016 1253   GLUCOSEU NEGATIVE 02/26/2016 1253   HGBUR NEGATIVE 02/26/2016 1253   BILIRUBINUR NEGATIVE 02/26/2016 1253   KETONESUR NEGATIVE 02/26/2016 1253   PROTEINUR NEGATIVE 02/26/2016 1253   UROBILINOGEN 1.0 11/13/2014 1338   NITRITE NEGATIVE 02/26/2016 1253   LEUKOCYTESUR TRACE (A) 02/26/2016 1253     RADIOLOGY: Dg Chest 2 View  Result Date: 03/05/2016 CLINICAL DATA:  31 year old female with chest pain. Hip and leg pain. Sickle cell crisis. Initial encounter. EXAM: CHEST  2 VIEW COMPARISON:  01/22/2016 chest radiographs and earlier. FINDINGS: Tunneled type right chest central catheter appears stable. Stable lung volumes, at the lower limits of normal. Stable mild elevation of the left hemidiaphragm. Mild cardiomegaly. Other mediastinal contours are within normal limits. Visualized tracheal air column is within normal limits. Stable pulmonary vascularity without acute edema. No pneumothorax, pleural effusion or confluent pulmonary opacity. Visualized osseous structures appear fairly unremarkable. Negative visible bowel gas pattern. IMPRESSION: No acute cardiopulmonary abnormality. Electronically Signed   By: Odessa Fleming M.D.   On: 03/05/2016 19:15    EKG: Orders placed or performed during the hospital encounter of 03/05/16  . EKG 12-Lead  . EKG 12-Lead  . ED EKG within 10 minutes  . ED EKG within 10 minutes    IMPRESSION AND PLAN: 31 year old female patient with history of sickle cell disease, migraines, depression presented to the emergency room with pain over the shoulders or the lower extremity, underneath the breasts bilaterally over the rib cage. Admitting diagnosis 1. Sickle cell crisis 2. Intractable pain 3. Hypokalemia Treatment plan Admit patient to medical floor observation bed IV fluid hydration Replace potassium Pain management Supportive care.  All the records are reviewed and case discussed with ED provider. Management plans discussed with the patient, family  and they are in agreement.  CODE STATUS:FULL CODE Code Status History    Date Active Date Inactive Code Status Order ID Comments User Context   02/14/2016 12:10 AM 02/17/2016 11:02 PM Full Code [id number]  Lorretta Harp, MD ED   01/30/2016  8:43 AM 01/30/2016  7:19 PM Full Code [id number]  Quentin Angst, MD Inpatient   01/23/2016  3:01 AM 01/28/2016  7:13 PM Full Code [id number]  Maryruth Bun Rama, MD Inpatient   01/21/2016  8:20 AM 01/21/2016  9:06 PM Full Code [id number]  Quentin Angst, MD Inpatient   01/17/2016 10:21 AM 01/17/2016  8:14 PM Full Code [id number]  Quentin Angst, MD Inpatient   01/15/2016  9:48 AM 01/15/2016  7:47 PM Full Code [id number]  Quentin Angst, MD Inpatient   01/05/2016 11:18 PM 01/12/2016  7:36 PM Full Code [id number]  Lorretta Harp, MD ED   12/31/2015 10:03 AM 12/31/2015  8:24 PM Full Code [id number]  Quentin Angst, MD Inpatient   12/26/2015  9:11 AM 12/26/2015  7:05 PM Full Code [id number]  Quentin Angst, MD Inpatient   12/06/2015 11:46 AM 12/06/2015  8:35 PM Full Code [id number]  Quentin Angst, MD Inpatient   12/05/2015  9:52 AM 12/05/2015  4:08 PM Full Code [id number]  Quentin Angst, MD Inpatient   12/03/2015 10:28 AM 12/04/2015 11:01 AM Full  Code [id number]  Quentin Angst, MD Inpatient   11/26/2015 11:26 AM 11/26/2015  9:04 PM Full Code [id number]  Quentin Angst, MD Inpatient   11/22/2015 12:41 AM 11/23/2015  4:41 PM Full Code [id number]  Rolly Salter, MD ED   11/20/2015 10:04 AM 11/21/2015 11:53 AM Full Code [id number]  Quentin Angst, MD Inpatient   11/15/2015  9:09 AM 11/18/2015 12:05 PM Full Code [id number]  Quentin Angst, MD Inpatient   11/14/2015 11:28 AM 11/14/2015  7:32 PM Full Code [id number]  Henrietta Hoover, NP Inpatient   11/04/2015 11:04 PM 11/11/2015  7:39 PM Full Code [id number]  Bobette Mo, MD ED   11/04/2015 11:04 PM 11/04/2015 11:04 PM Full Code [id number]  Bobette Mo, MD ED   11/04/2015 11:04 PM 11/04/2015  11:04 PM Full Code [id number]  Bobette Mo, MD ED   11/01/2015 10:47 AM 11/04/2015  2:49 PM Full Code [id number]  Quentin Angst, MD Inpatient   10/19/2015  9:32 PM 10/27/2015  4:59 PM Full Code [id number]  Therisa Doyne, MD ED   10/18/2015 10:21 AM 10/18/2015  8:27 PM Full Code [id number]  Quentin Angst, MD Inpatient   10/08/2015  9:21 PM 10/13/2015 10:05 PM Full Code [id number]  Bobette Mo, MD Inpatient   10/08/2015  9:21 PM 10/08/2015  9:21 PM Full Code [id number]  Bobette Mo, MD Inpatient   10/08/2015  9:21 PM 10/08/2015  9:21 PM Full Code [id number]  Bobette Mo, MD Inpatient   10/01/2015  9:24 AM 10/01/2015  8:40 PM Full Code [id number]  Quentin Angst, MD Inpatient   09/24/2015  9:14 AM 09/24/2015  8:11 PM Full Code [id number]  Quentin Angst, MD Inpatient   09/16/2015 10:22 AM 09/16/2015  8:21 PM Full Code [id number]  Quentin Angst, MD Inpatient   08/21/2015  9:28 PM 08/23/2015  7:27 PM Full Code [id number]  Jonah Blue, MD Inpatient   08/06/2015 10:08 AM 08/07/2015 12:29 PM Full Code [id number]  Quentin Angst, MD Inpatient   07/12/2015 11:18 PM 07/19/2015  6:56 PM Full Code [id number]  Ron Parker, MD Inpatient   07/05/2015  9:42 AM 07/05/2015  8:10 PM Full Code [id number]  Quentin Angst, MD Inpatient   06/25/2015 11:34 AM 06/25/2015  8:21 PM Full Code [id number]  Quentin Angst, MD Inpatient   04/30/2015 10:12 AM 04/30/2015  8:44 PM Full Code [id number]  Quentin Angst, MD Inpatient   03/26/2015 11:13 AM 03/26/2015  9:06 PM Full Code [id number]  Quentin Angst, MD Inpatient   03/07/2015 11:33 PM 03/13/2015  6:43 PM Full Code [id number]  Briscoe Deutscher, MD ED   01/08/2015 12:19 PM 01/08/2015  8:49 PM Full Code [id number]  Quentin Angst, MD Inpatient   11/13/2014 11:14 AM 11/13/2014  8:28 PM Full Code [id number]  Quentin Angst, MD Inpatient   10/29/2014 11:15 AM 10/29/2014  8:43 PM Full Code [id number]  Henrietta Hoover, NP Inpatient   10/22/2014 10:55  AM 10/22/2014  9:43 PM Full Code [id number]  Henrietta Hoover, NP Inpatient   10/11/2014 11:22 AM 10/11/2014  9:39 PM Full Code [id number]  Quentin Angst, MD Inpatient   09/24/2014 11:08 AM 09/24/2014  9:42 PM Full Code [id number]  Quentin Angst, MD Inpatient   09/21/2014  1:45 PM 09/24/2014 11:08 AM Full Code [id number]  Quentin Angst, MD Inpatient   08/29/2014 12:12 PM 08/30/2014  1:06 PM Full Code [id number]  Quentin Angst, MD Inpatient   08/15/2014 10:42 AM 08/15/2014  9:38 PM Full Code [id number]  Altha Harm, MD Inpatient   08/13/2014 11:39 AM 08/13/2014  9:26 PM Full Code [id number]  Rometta Emery, MD Inpatient   07/12/2014 11:52 AM 07/12/2014  8:24 PM Full Code [id number]  Rometta Emery, MD Inpatient   07/03/2014 10:59 AM 07/03/2014 10:09 PM Full Code [id number]  Altha Harm, MD Inpatient   06/28/2014  8:42 AM 06/28/2014  6:42 PM Full Code [id number]  Rometta Emery, MD Inpatient   06/26/2014  9:38 AM 06/26/2014  8:51 PM Full Code [id number]  Altha Harm, MD Inpatient   04/29/2014  8:11 PM 05/01/2014  2:07 PM Full Code [id number]  Catarina Hartshorn, MD Inpatient   04/24/2014  1:53 PM 04/24/2014  6:52 PM Full Code [id number]  Altha Harm, MD Inpatient   04/19/2014 11:05 AM 04/19/2014  9:30 PM Full Code [id number]  Rometta Emery, MD Inpatient   04/06/2014  7:37 PM 04/10/2014  6:29 PM Full Code [id number]  Hillary Bow, DO ED   03/18/2014 10:16 PM 03/28/2014  3:19 PM Full Code [id number]  Doree Albee, MD ED   02/10/2014 12:08 AM 02/10/2014 12:08 AM Full Code [id number]  Ozella Rocks, MD ED   02/10/2014 12:08 AM 02/14/2014  8:23 PM Full Code [id number]  Ozella Rocks, MD ED   01/29/2014  4:41 AM 02/03/2014  5:57 PM Full Code [id number]  Ron Parker, MD Inpatient   12/30/2013 10:09 PM 01/03/2014  4:19 PM Full Code [id number]  Eduard Clos, MD Inpatient       TOTAL TIME TAKING CARE OF THIS PATIENT: 52 minutes.    Ihor Austin M.D on 03/06/2016 at 3:52  AM  Between 7am to 6pm - Pager - [phone]  After 6pm go to [email or web address] - password [email or web address] [facility]  [facility] [place] [facility]  Office  [phone]  CC: Primary care physician; Jeanann Lewandowsky, MD    To telemetry bed oh 26

## 2016-03-06 NOTE — ED Notes (Signed)
MD Manson PasseyBrown in room to talk to pt.  When asked pt to put away phone, pt tossed phone to side, bounced off bed and onto floor.  Pt then kicked phone underneath stretcher.   Pt sts she wants to stay but does not want to wait 2 hours.  Pt sts that she doesn't want toradol.

## 2016-03-06 NOTE — ED Notes (Signed)
ED Provider at bedside. 

## 2016-03-06 NOTE — ED Notes (Signed)
Pt irate at care plan.  Sts that [facility]  Pt shown care plan by MD Manson PasseyBrown, who has read guidelines out to pt.  Pt sts that she "don't want no pills'.  MD Manson PasseyBrown explained to pt that he will

## 2016-03-06 NOTE — Progress Notes (Signed)
31 yo F with sickle cell presents with pain to chest, legs and hips.  CXR clear.  Per EDP, all [facility] sickle cell patients to [facility].  Accepted to med surg bed.

## 2016-03-06 NOTE — ED Notes (Signed)
Pt sts that she has not heard of care plan.  Pt sts "I've been waiting out there for 6 hours, I can't" "I can't take no pills".  MD Manson PasseyBrown explaining to pt care plan, pro's/con's of PO vs IV dilaudid. Pt sts "I don't want it, I ain't been sitting out there 6 hours for no pills" "You better call somebody".  MD Manson PasseyBrown explaining that "I'm sticking with the care plan".  Pt: "I ain"t never heard it" when asked about care plan.

## 2016-03-06 NOTE — Progress Notes (Signed)
Shift assessment completed at 0830. Pt is awake but a little drowsy, requested pain medication ,received dilaudid iv for pain rated 9/10. Pt is alert and oriented, is not grimacing or guarding, respirations are unlabored, lungs are clear bilat. HR is regular. Pt has single lumen central line intact to R chest wall, iv ns infusing at 15150mls/hr, site is free of redness and swelling.Abdomen is soft, bs heard. Pt is oob to bathroom to void prn, ppp, no edema noted. Since assessment, pt received iv dilaudid at 1030, pt refused half of her hydrea, stating she only takes one pill of this at home, pt did not know the mg dosage. Pt also refused lovenox, stating she knew what this was and did not want it. Report given to Edison SimonAudry, RN at 1145, care of pt released.

## 2016-03-06 NOTE — ED Provider Notes (Signed)
[facility] Emergency Department Provider Note  ____________________________________________   First MD Initiated Contact with Patient 03/06/16 0008     (approximate)  I have reviewed the triage vital signs and the nursing notes.   HISTORY  Chief Complaint Sickle Cell Pain Crisis   HPI Brittany Foster is a 31 y.o. female with history of sickle cell anemia presents to the emergency department with generalized pain 2 days that she states has progressively worsened. Patient denies any fever no chest pain no shortness of breath. Patient states her current pain score is 10 out of 10. Patient denies any recent illness.   Past Medical History:  Diagnosis Date  . Anemia   . Depression, major, recurrent (HCC)   . Migraines   . Sickle cell anemia [facility])     Patient Active Problem List   Diagnosis Date Noted  . Sickle cell pain crisis (HCC) 02/14/2016  . Sickle cell anemia with pain (HCC) 02/14/2016  . Migraine without status migrainosus, not intractable   . Sickle cell anemia with crisis (HCC) 01/30/2016  . Hypotension 01/26/2016  . Opiate dependence (HCC) 01/23/2016  . MDD (major depressive disorder), recurrent severe, without psychosis (HCC) 01/10/2016  . Vitamin D deficiency 01/08/2015  . Chronic pain syndrome 01/08/2015  . 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 Anemia 02/09/2014  . Neuropathy (HCC) 02/09/2014  . Chronic headache disorder 02/09/2014    Past Surgical History:  Procedure Laterality Date  . CESAREAN SECTION    . CHOLECYSTECTOMY  2000  . IR GENERIC HISTORICAL  10/08/2015   IR US GUIDE VASC ACCESS RIGHT 10/08/2015 Simonne Come, MD [facility]-INTERV RAD  . IR GENERIC HISTORICAL  10/08/2015   IR FLUORO GUIDE CV LINE RIGHT 10/08/2015 Simonne Come, MD [facility]INTER[facility]  . MULTIPLE TOOTH EXTRACTIONS N/A   . port a cath placement Right    about 6-7 years ago  . removal of porta cath Right 09/11/15  . TUBAL LIGATION      Prior to Admission  medications   Medication Sig Start Date End Date Taking? Authorizing Provider  ARIPiprazole (ABILIFY) 5 MG tablet Take 1 tablet (5 mg total) by mouth 2 (two) times daily. 01/12/16   Quentin Angst, MD  ciprofloxacin (CIPRO) 250 MG tablet Take 1 tablet (250 mg total) by mouth 2 (two) times daily. 02/17/16   Altha Harm, MD  DULoxetine (CYMBALTA) 60 MG capsule TAKE 1 CAPSULE BY MOUTH DAILY. 11/27/15   Quentin Angst, MD  folic acid (FOLVITE) 1 MG tablet Take 1 tablet (1 mg total) by mouth daily. 01/17/16   Quentin Angst, MD  gabapentin (NEURONTIN) 300 MG capsule TAKE 1 CAPSULE BY MOUTH 3 TIMES DAILY. Patient not taking: Reported on 02/13/2016 11/27/15   Quentin Angst, MD  hydroxyurea (HYDREA) 500 MG capsule TAKE 2 CAPSULES BY MOUTH DAILY. MAY TAKE WITH FOOD TO MINIMIZE GI SIDE EFFECTS. 08/08/15   [name], FNP  ibuprofen (ADVIL,MOTRIN) 200 MG tablet Take 400 mg by mouth every 6 (six) hours as needed for moderate pain.    Historical Provider, MD  morphine (MS CONTIN) 30 MG 12 hr tablet TAKE ONE TABLET BY MOUTH EVERY TWELVE HOURS 02/10/16   Quentin Angst, MD  oxyCODONE-acetaminophen (PERCOCET) 10-325 MG tablet TAKE ONE TABLET EVERY 4 HOURS AS NEEDED FOR pain 02/23/16   Quentin Angst, MD  potassium chloride SA (K-DUR,KLOR-CON) 20 MEQ tablet Take 2 tablets (40 mEq total) by mouth daily. Patient not taking: Reported on  02/13/2016 11/23/15   Altha Harm, MD  Topiramate ER 100 MG CP24 Take 100 mg by mouth at bedtime.    Historical Provider, MD    Allergies Ultram [tramadol]; Zofran [ondansetron hcl]; Buprenorphine hcl; Morphine and related; and Tape  Family History  Problem Relation Age of Onset  . Sickle cell trait Father   . Sickle cell trait Mother   . Sickle cell anemia Other     Social History Social History  Substance Use Topics  . Smoking status: Former Games developer  . Smokeless tobacco: Never Used  . Alcohol use No    Review of  Systems Constitutional: No fever/chills Eyes: No visual changes. ENT: No sore throat. Cardiovascular: Denies chest pain. Respiratory: Denies shortness of breath. Gastrointestinal: No abdominal pain.  No nausea, no vomiting.  No diarrhea.  No constipation. Genitourinary: Negative for dysuria. Musculoskeletal: Positive generalized pain Skin: Negative for rash. Neurological: Negative for headaches, focal weakness or numbness.  10-point ROS otherwise negative.  ____________________________________________   PHYSICAL EXAM:  VITAL SIGNS: ED Triage Vitals  Enc Vitals Group     BP 03/05/16 1846 110/65     Pulse Rate 03/05/16 1846 97     Resp 03/05/16 1846 16     Temp 03/05/16 1846 98.9 F (37.2 C)     Temp Source 03/05/16 1846 Oral     SpO2 03/05/16 1846 99 %     Weight 03/05/16 1844 130 lb (59 kg)     Height 03/05/16 1844 5' (1.524 m)     Head Circumference --      Peak Flow --      Pain Score 03/05/16 1844 10     Pain Loc --      Pain Edu? --      Excl. in GC? --     Constitutional: Alert and oriented. Well appearing and in no acute distress. Eyes: Conjunctivae are normal. PERRL. EOMI. Head: Atraumatic. Mouth/Throat: Mucous membranes are moist.  Oropharynx non-erythematous. Neck: No stridor.  No meningeal signs.  Cardiovascular: Normal rate, regular rhythm. Good peripheral circulation. Grossly normal heart sounds. Respiratory: Normal respiratory effort.  No retractions. Lungs CTAB. Gastrointestinal: Soft and nontender. No distention.  Musculoskeletal: No lower extremity tenderness nor edema. No gross deformities of extremities. Neurologic:  Normal speech and language. No gross focal neurologic deficits are appreciated.  Skin:  Skin is warm, dry and intact. No rash noted.   ____________________________________________   LABS (all labs ordered are listed, but only abnormal results are displayed)  Labs Reviewed  CBC WITH DIFFERENTIAL/PLATELET - Abnormal; Notable for  the following:       Result Value   WBC 14.7 (*)    RBC 2.42 (*)    Hemoglobin 8.7 (*)    HCT 25.0 (*)    MCV 103.4 (*)    MCH 36.0 (*)    RDW 22.9 (*)    Platelets 446 (*)    nRBC 3 (*)    Neutro Abs 8.3 (*)    Lymphs Abs 4.4 (*)    Monocytes Absolute 1.5 (*)    Basophils Absolute 0.4 (*)    All other components within normal limits  COMPREHENSIVE METABOLIC PANEL - Abnormal; Notable for the following:    Potassium 3.2 (*)    Creatinine, Ser 0.34 (*)    Calcium 8.8 (*)    AST 47 (*)    Total Bilirubin 1.9 (*)    All other components within normal limits  RETICULOCYTES - Abnormal; Notable for the following:  Retic Ct Pct 5.9 (*)    RBC. 2.42 (*)    All other components within normal limits  TROPONIN I  PATHOLOGIST SMEAR REVIEW  URINALYSIS, COMPLETE (UACMP) WITH MICROSCOPIC   ____________________________________________  EKG  ED ECG REPORT I, Ursina N Barrington Worley, the attending physician, personally viewed and interpreted this ECG.   Date: 03/13/2016  EKG Time: 6:58 PM  Rate: 95  Rhythm: Normal sinus rhythm  Axis: Normal  Intervals: Normal  ST&T Change: None  ____________________________________________  RADIOLOGY I, Bellefontaine Neighbors N Stran Raper, personally viewed and evaluated these images (plain radiographs) as part of my medical decision making, as well as reviewing the written report by the radiologist.  Dg Chest 2 View  Result Date: 03/05/2016 CLINICAL DATA:  31 year old female with chest pain. Hip and leg pain. Sickle cell crisis. Initial encounter. EXAM: CHEST  2 VIEW COMPARISON:  01/22/2016 chest radiographs and earlier. FINDINGS: Tunneled type right chest central catheter appears stable. Stable lung volumes, at the lower limits of normal. Stable mild elevation of the left hemidiaphragm. Mild cardiomegaly. Other mediastinal contours are within normal limits. Visualized tracheal air column is within normal limits. Stable pulmonary vascularity without acute edema. No  pneumothorax, pleural effusion or confluent pulmonary opacity. Visualized osseous structures appear fairly unremarkable. Negative visible bowel gas pattern. IMPRESSION: No acute cardiopulmonary abnormality. Electronically Signed   By: Odessa FlemingH  Hall M.D.   On: 03/05/2016 19:15   :   Procedures     INITIAL IMPRESSION / ASSESSMENT AND PLAN / ED COURSE  Pertinent labs & imaging results that were available during my care of the patient were reviewed by me and considered in my medical decision making (see chart for details).  Patient treated as per proposed pain management plan however patient's pain persisted and as such she received IV Dilaudid multiple doses without resolution of pain which resulted in the patient being admitted to the hospital.   Clinical Course     ____________________________________________  FINAL CLINICAL IMPRESSION(S) / ED DIAGNOSES  Sickle cell pain crisis   MEDICATIONS GIVEN DURING THIS VISIT:  Medications  ketorolac (TORADOL) 30 MG/ML injection 30 mg (30 mg Intravenous Not Given 03/06/16 0040)  morphine (MS CONTIN) 12 hr tablet 30 mg (30 mg Oral Given 03/06/16 0040)  HYDROmorphone (DILAUDID) 1 MG/ML injection (not administered)  HYDROmorphone (DILAUDID) injection 1 mg (not administered)  HYDROmorphone (DILAUDID) tablet 4 mg (4 mg Oral Given 03/06/16 0040)  HYDROmorphone (DILAUDID) injection 1 mg (1 mg Intravenous Given 03/06/16 0130)     NEW OUTPATIENT MEDICATIONS STARTED DURING THIS VISIT:  New Prescriptions   No medications on file    Modified Medications   No medications on file    Discontinued Medications   No medications on file     Note:  This document was prepared using Dragon voice recognition software and may include unintentional dictation errors.    Darci Currentandolph N Rayna Brenner, MD 03/13/16 0800

## 2016-03-06 NOTE — Discharge Summary (Signed)
[facility]   PATIENT NAME: Brittany Foster    MR#:  [id number]  DATE OF BIRTH:  11-14-1985  DATE OF ADMISSION:  03/05/2016 ADMITTING PHYSICIAN: Ihor Austin, MD  DATE OF DISCHARGE: 03/07/2015  PRIMARY CARE PHYSICIAN: Jeanann Lewandowsky, MD   ADMISSION DIAGNOSIS:  chest pain  DISCHARGE DIAGNOSIS:  Active Problems:   Sickle cell crisis (HCC)   SECONDARY DIAGNOSIS:   Past Medical History:  Diagnosis Date  . Anemia   . Depression, major, recurrent (HCC)   . Migraines   . Sickle cell anemia (HCC)      ADMITTING HISTORY  HISTORY OF PRESENT ILLNESS: Brittany Foster  is a 31 y.o. female with a known history of sickle cell disease, migraines, depression presented to the emergency room with pain over the legs upper extremities shoulder and hips. Patient is sharp stabbing pain 10 out of 10 on a scale of 1-10. No complaints of any fever or chills. No complaints of any cough. Also has some pain in the rib cage underneath both breasts which is sharp in nature. Patient usually follows up at [facility] for sickle cell anemia. Chest x-ray did not reveal any pneumonia. Hospitalist service was consulted for further care of sickle cell crisis and pain management. No history of recent travel or sick contacts at home. No complaints of any headache, dizziness and blurry vision.   HOSPITAL COURSE:   * Sickle cell pain crisis Afebrile. Normal WBC Started on Dilaudid PRN and then switched to PCA after discussing with Dr. Ashley Royalty.  Transfer to [facility] for sickle cell pain crisis.  Stable for transfer.  CONSULTS OBTAINED:    DRUG ALLERGIES:   Allergies  Allergen Reactions  . Ultram [Tramadol] Other (See Comments)    seizures  . Zofran [Ondansetron Hcl] Nausea And Vomiting  . Buprenorphine Hcl Hives and Rash    Shaking Tolerates Percocet, Norco, and buprenorphine  . Morphine And Related Hives, Rash and Other (See Comments)     Shaking Tolerates Percocet, Norco, Dilaudid, and buprenorphine  . Tape Rash    DISCHARGE MEDICATIONS:   Current Discharge Medication List    CONTINUE these medications which have NOT CHANGED   Details  ARIPiprazole (ABILIFY) 5 MG tablet Take 1 tablet (5 mg total) by mouth 2 (two) times daily. Qty: 60 tablet, Refills: 3    DULoxetine (CYMBALTA) 60 MG capsule TAKE 1 CAPSULE BY MOUTH DAILY. Qty: 30 capsule, Refills: 3   Associated Diagnoses: Chronic pain syndrome    folic acid (FOLVITE) 1 MG tablet Take 1 tablet (1 mg total) by mouth daily. Qty: 90 tablet, Refills: 3   Associated Diagnoses: Hb-SS disease without crisis (HCC)    hydroxyurea (HYDREA) 500 MG capsule TAKE 2 CAPSULES BY MOUTH DAILY. MAY TAKE WITH FOOD TO MINIMIZE GI SIDE EFFECTS. Qty: 60 capsule, Refills: 3   Associated Diagnoses: Hb-SS disease without crisis (HCC)    ibuprofen (ADVIL,MOTRIN) 200 MG tablet Take 400 mg by mouth every 6 (six) hours as needed for moderate pain.    oxyCODONE-acetaminophen (PERCOCET) 10-325 MG tablet TAKE ONE TABLET EVERY 4 HOURS AS NEEDED FOR pain Qty: 90 tablet, Refills: 0    Topiramate ER 100 MG CP24 Take 100 mg by mouth at bedtime.    gabapentin (NEURONTIN) 300 MG capsule TAKE 1 CAPSULE BY MOUTH 3 TIMES DAILY. Qty: 90 capsule, Refills: 3    morphine (MS CONTIN) 30 MG 12 hr tablet TAKE ONE TABLET BY MOUTH EVERY TWELVE HOURS Qty:  60 tablet, Refills: 0   Associated Diagnoses: Hb-SS disease without crisis (HCC)    potassium chloride SA (K-DUR,KLOR-CON) 20 MEQ tablet Take 2 tablets (40 mEq total) by mouth daily. Qty: 30 tablet, Refills: 1        Today   VITAL SIGNS:  Blood pressure (!) 100/59, pulse 64, temperature 98.6 F (37 C), temperature source Oral, resp. rate 18, height 5' (1.524 m), weight 57.1 kg (125 lb 12.8 oz), last menstrual period 02/07/2016, SpO2 94 %.  I/O:   Intake/Output Summary (Last 24 hours) at 03/06/16 1532 Last data filed at 03/06/16 1100  Gross per 24  hour  Intake              680 ml  Output              300 ml  Net              380 ml    PHYSICAL EXAMINATION:  Physical Exam  GENERAL:  31 y.o.-year-old patient lying in the bed with no acute distress.  LUNGS: Normal breath sounds bilaterally, no wheezing, rales,rhonchi or crepitation. No use of accessory muscles of respiration.  CARDIOVASCULAR: S1, S2 normal. No murmurs, rubs, or gallops.  PSYCHIATRIC: The patient is alert and oriented x 3.  SKIN: No obvious rash, lesion, or ulcer.   DATA REVIEW:   CBC  Recent Labs Lab 03/06/16 1020  WBC 12.7*  HGB 7.6*  HCT 21.5*  PLT 373    Chemistries   Recent Labs Lab 03/05/16 1849 03/06/16 1020  NA 139 141  K 3.2* 3.3*  CL 108 113*  CO2 26 25  GLUCOSE 82 92  BUN 8 8  CREATININE 0.34* 0.43*  CALCIUM 8.8* 8.1*  AST 47*  --   ALT 24  --   ALKPHOS 94  --   BILITOT 1.9*  --     Cardiac Enzymes  Recent Labs Lab 03/05/16 1849  TROPONINI <0.03    Microbiology Results  Results for orders placed or performed during the hospital encounter of 02/13/16  Culture, Urine     Status: Abnormal   Collection Time: 02/17/16  4:33 PM  Result Value Ref Range Status   Specimen Description URINE, CLEAN CATCH  Final   Special Requests NONE  Final   Culture >=100,000 COLONIES/mL ESCHERICHIA COLI (A)  Final   Report Status 02/20/2016 FINAL  Final   Organism ID, Bacteria ESCHERICHIA COLI (A)  Final      Susceptibility   Escherichia coli - MIC*    AMPICILLIN <=2 SENSITIVE Sensitive     CEFAZOLIN <=4 SENSITIVE Sensitive     CEFTRIAXONE <=1 SENSITIVE Sensitive     CIPROFLOXACIN <=0.25 SENSITIVE Sensitive     GENTAMICIN <=1 SENSITIVE Sensitive     IMIPENEM <=0.25 SENSITIVE Sensitive     NITROFURANTOIN <=16 SENSITIVE Sensitive     TRIMETH/SULFA <=20 SENSITIVE Sensitive     AMPICILLIN/SULBACTAM <=2 SENSITIVE Sensitive     PIP/TAZO <=4 SENSITIVE Sensitive     Extended ESBL NEGATIVE Sensitive     * >=100,000 COLONIES/mL ESCHERICHIA  COLI    RADIOLOGY:  Dg Chest 2 View  Result Date: 03/05/2016 CLINICAL DATA:  31 year old female with chest pain. Hip and leg pain. Sickle cell crisis. Initial encounter. EXAM: CHEST  2 VIEW COMPARISON:  01/22/2016 chest radiographs and earlier. FINDINGS: Tunneled type right chest central catheter appears stable. Stable lung volumes, at the lower limits of normal. Stable mild elevation of the left hemidiaphragm. Mild cardiomegaly. Other  mediastinal contours are within normal limits. Visualized tracheal air column is within normal limits. Stable pulmonary vascularity without acute edema. No pneumothorax, pleural effusion or confluent pulmonary opacity. Visualized osseous structures appear fairly unremarkable. Negative visible bowel gas pattern. IMPRESSION: No acute cardiopulmonary abnormality. Electronically Signed   By: Odessa Fleming M.D.   On: 03/05/2016 19:15    Follow up with PCP in 1 week.  Management plans discussed with the patient, family and they are in agreement.  CODE STATUS:     Code Status Orders        Start     Ordered   03/06/16 0537  Full code  Continuous     03/06/16 0536    Code Status History    Date Active Date Inactive Code Status Order ID Comments User Context   02/14/2016 12:10 AM 02/17/2016 11:02 PM Full Code [id number]  Lorretta Harp, MD ED   01/30/2016  8:43 AM 01/30/2016  7:19 PM Full Code [id number]  Quentin Angst, MD Inpatient   01/23/2016  3:01 AM 01/28/2016  7:13 PM Full Code [id number]  Maryruth Bun Rama, MD Inpatient   01/21/2016  8:20 AM 01/21/2016  9:06 PM Full Code [id number]  Quentin Angst, MD Inpatient   01/17/2016 10:21 AM 01/17/2016  8:14 PM Full Code [id number]  Quentin Angst, MD Inpatient   01/15/2016  9:48 AM 01/15/2016  7:47 PM Full Code [id number]  Quentin Angst, MD Inpatient   01/05/2016 11:18 PM 01/12/2016  7:36 PM Full Code [id number]  Lorretta Harp, MD ED   12/31/2015 10:03 AM 12/31/2015  8:24 PM Full Code [id number]  Quentin Angst, MD Inpatient   12/26/2015  9:11 AM 12/26/2015  7:05 PM Full Code [id number]  Quentin Angst, MD Inpatient   12/06/2015 11:46 AM 12/06/2015  8:35 PM Full Code [id number]  Quentin Angst, MD Inpatient   12/05/2015  9:52 AM 12/05/2015  4:08 PM Full Code [id number]  Quentin Angst, MD Inpatient   12/03/2015 10:28 AM 12/04/2015 11:01 AM Full Code [id number]  Quentin Angst, MD Inpatient   11/26/2015 11:26 AM 11/26/2015  9:04 PM Full Code [id number]  Quentin Angst, MD Inpatient   11/22/2015 12:41 AM 11/23/2015  4:41 PM Full Code [id number]  Rolly Salter, MD ED   11/20/2015 10:04 AM 11/21/2015 11:53 AM Full Code [id number]  Quentin Angst, MD Inpatient   11/15/2015  9:09 AM 11/18/2015 12:05 PM Full Code [id number]  Quentin Angst, MD Inpatient   11/14/2015 11:28 AM 11/14/2015  7:32 PM Full Code [id number]  Henrietta Hoover, NP Inpatient   11/04/2015 11:04 PM 11/11/2015  7:39 PM Full Code [id number]  Bobette Mo, MD ED   11/04/2015 11:04 PM 11/04/2015 11:04 PM Full Code [id number]  Bobette Mo, MD ED   11/04/2015 11:04 PM 11/04/2015 11:04 PM Full Code [id number]  Bobette Mo, MD ED   11/01/2015 10:47 AM 11/04/2015  2:49 PM Full Code [id number]  Quentin Angst, MD Inpatient   10/19/2015  9:32 PM 10/27/2015  4:59 PM Full Code [id number]  Therisa Doyne, MD ED   10/18/2015 10:21 AM 10/18/2015  8:27 PM Full Code [id number]  Quentin Angst, MD Inpatient   10/08/2015  9:21 PM 10/13/2015 10:05 PM Full Code [id number]  Bobette Mo, MD Inpatient   10/08/2015  9:21 PM 10/08/2015  9:21 PM Full Code [id number]  Bobette Mo, MD Inpatient   10/08/2015  9:21 PM 10/08/2015  9:21 PM  Full Code [id number]  Bobette Mo, MD Inpatient   10/01/2015  9:24 AM 10/01/2015  8:40 PM Full Code [id number]  Quentin Angst, MD Inpatient   09/24/2015  9:14 AM 09/24/2015  8:11 PM Full Code [id number]  Quentin Angst, MD Inpatient   09/16/2015 10:22 AM 09/16/2015  8:21 PM Full Code [id number]   Quentin Angst, MD Inpatient   08/21/2015  9:28 PM 08/23/2015  7:27 PM Full Code [id number]  Jonah Blue, MD Inpatient   08/06/2015 10:08 AM 08/07/2015 12:29 PM Full Code [id number]  Quentin Angst, MD Inpatient   07/12/2015 11:18 PM 07/19/2015  6:56 PM Full Code [id number]  Ron Parker, MD Inpatient   07/05/2015  9:42 AM 07/05/2015  8:10 PM Full Code [id number]  Quentin Angst, MD Inpatient   06/25/2015 11:34 AM 06/25/2015  8:21 PM Full Code [id number]  Quentin Angst, MD Inpatient   04/30/2015 10:12 AM 04/30/2015  8:44 PM Full Code [id number]  Quentin Angst, MD Inpatient   03/26/2015 11:13 AM 03/26/2015  9:06 PM Full Code [id number]  Quentin Angst, MD Inpatient   03/07/2015 11:33 PM 03/13/2015  6:43 PM Full Code [id number]  Briscoe Deutscher, MD ED   01/08/2015 12:19 PM 01/08/2015  8:49 PM Full Code [id number]  Quentin Angst, MD Inpatient   11/13/2014 11:14 AM 11/13/2014  8:28 PM Full Code [id number]  Quentin Angst, MD Inpatient   10/29/2014 11:15 AM 10/29/2014  8:43 PM Full Code [id number]  Henrietta Hoover, NP Inpatient   10/22/2014 10:55 AM 10/22/2014  9:43 PM Full Code [id number]  Henrietta Hoover, NP Inpatient   10/11/2014 11:22 AM 10/11/2014  9:39 PM Full Code [id number]  Quentin Angst, MD Inpatient   09/24/2014 11:08 AM 09/24/2014  9:42 PM Full Code [id number]  Quentin Angst, MD Inpatient   09/21/2014  1:45 PM 09/24/2014 11:08 AM Full Code [id number]  Quentin Angst, MD Inpatient   08/29/2014 12:12 PM 08/30/2014  1:06 PM Full Code [id number]  Quentin Angst, MD Inpatient   08/15/2014 10:42 AM 08/15/2014  9:38 PM Full Code [id number]  Altha Harm, MD Inpatient   08/13/2014 11:39 AM 08/13/2014  9:26 PM Full Code [id number]  Rometta Emery, MD Inpatient   07/12/2014 11:52 AM 07/12/2014  8:24 PM Full Code [id number]  Rometta Emery, MD Inpatient   07/03/2014 10:59 AM 07/03/2014 10:09 PM Full Code [id number]  Altha Harm, MD Inpatient   06/28/2014  8:42 AM  06/28/2014  6:42 PM Full Code [id number]  Rometta Emery, MD Inpatient   06/26/2014  9:38 AM 06/26/2014  8:51 PM Full Code [id number]  Altha Harm, MD Inpatient   04/29/2014  8:11 PM 05/01/2014  2:07 PM Full Code [id number]  Catarina Hartshorn, MD Inpatient   04/24/2014  1:53 PM 04/24/2014  6:52 PM Full Code [id number]  Altha Harm, MD Inpatient   04/19/2014 11:05 AM 04/19/2014  9:30 PM Full Code [id number]  Rometta Emery, MD Inpatient   04/06/2014  7:37 PM 04/10/2014  6:29 PM Full Code [id number]  Hillary Bow, DO ED   03/18/2014 10:16 PM 03/28/2014  3:19 PM Full Code [id number]  Doree Albee, MD ED   02/10/2014 12:08 AM 02/10/2014 12:08 AM Full Code [id number]  Ozella Rocks, MD ED   02/10/2014 12:08 AM 02/14/2014  8:23 PM Full Code [id number]  Ozella Rocks, MD ED   01/29/2014  4:41 AM 02/03/2014  5:57 PM  Full Code [id number]  Ron ParkerHarvette C Jenkins, MD Inpatient   12/30/2013 10:09 PM 01/03/2014  4:19 PM Full Code [id number]  Eduard ClosArshad N Kakrakandy, MD Inpatient      TOTAL TIME TAKING CARE OF THIS PATIENT ON DAY OF DISCHARGE: more than 30 minutes.   Milagros LollSudini, Kelson Queenan R M.D on 03/06/2016 at 3:32 PM  Between 7am to 6pm - Pager - [phone]  After 6pm go to [email or web address] - password [email or web address]  [facility]  Office  [phone]  CC: Primary care physician; Jeanann LewandowskyJEGEDE, OLUGBEMIGA, MD  Note: This dictation was prepared with Dragon dictation along with smaller phrase technology. Any transcriptional errors that result from this process are unintentional.

## 2016-03-06 NOTE — Telephone Encounter (Signed)
Returned patient's call, no answer. Left message.   Massie MaroonHollis,Lula Michaux M, FNP

## 2016-03-06 NOTE — Progress Notes (Signed)
Pt transfering to [place]. Report called to Florentina AddisonKatie, RN on 4th floor.  PCA started with hydromorphone, set for sickle cell patient.

## 2016-03-06 NOTE — H&P (Addendum)
History and Physical  Patient Name: Brittany Foster     JXB:[id number]    DOB: 08/11/1985    DOA: 03/06/2016 PCP: Jeanann LewandowskyJEGEDE, OLUGBEMIGA, MD   Patient coming from: [place]  Chief Complaint: Pain crisis  HPI: Brittany Foster is a 31 y.o. female with a past medical history significant for sickle cell disease and  who presents with complaints of sickle cell pain during Wednesday night with complaints of chest pain, described as sharp shooting pains to her chest, bilateral leg pains and hip pains. She states she was taking her meds regularly including her by mouth narcotics including MS Contin and short acting with no relief.  [facility] and [facility] admitted and then transferred to our facility, arrived tonight, for further evaluation and care.  She states her pains about the same. Currently, but she denies any acute shortness of breath is translating in the room without any difficulties and about to shower.   She was seen up on the hospital floor. Her friend is present. Patient was last admitted and discharged on 02/26/2016.  [place] -given prn dilaudid iv, admitted to inpt [facility] and than transferred to our facility -Na 139, K 3.2, Cr 0.34 (baseline 0.3), WBC 14.7, than trended down to12.7, Hgb 8.7, than trended to 7.6      ROS: Review of Systems  Cardiovascular: Positive for chest pain.  Musculoskeletal: Positive for myalgias.  pain in bilat hips and bilat legs. Denies shortness of breath, cough, fevers, chills, diarrhea, constipation, dysuria, hematuria, hamatochezia All other systems reviewed and unremarkable except for above      Past Medical History:  Diagnosis Date  . Anemia   . Depression, major, recurrent (HCC)   . Migraines   . Sickle cell anemia (HCC)     Past Surgical History:  Procedure Laterality Date  . CESAREAN SECTION    . CHOLECYSTECTOMY  2000  . IR GENERIC HISTORICAL  10/08/2015   IR US GUIDE VASC ACCESS RIGHT 10/08/2015 Simonne ComeJohn Watts, MD  [facility]  . IR GENERIC HISTORICAL  10/08/2015   IR FLUORO GUIDE CV LINE RIGHT 10/08/2015 Simonne ComeJohn Watts, MD [facility]  . MULTIPLE TOOTH EXTRACTIONS N/A   . port a cath placement Right    about 6-7 years ago  . removal of porta cath Right 09/11/15  . TUBAL LIGATION      Social History: Patient lives home.  The patient walks without difficulty.   reports that she has quit smoking. She has never used smokeless tobacco. She reports that she does not drink alcohol or use drugs.  Allergies  Allergen Reactions  . Ultram [Tramadol] Other (See Comments)    seizures  . Zofran [Ondansetron Hcl] Nausea And Vomiting  . Buprenorphine Hcl Hives and Rash    Shaking Tolerates Percocet, Norco, and buprenorphine  . Morphine And Related Hives, Rash and Other (See Comments)    Shaking Tolerates Percocet, Norco, Dilaudid, and buprenorphine  . Tape Rash    Family history: family history includes Sickle cell anemia in her other; Sickle cell trait in her father and mother.  Prior to Admission medications   Medication Sig Start Date End Date Taking? Authorizing Provider  ARIPiprazole (ABILIFY) 5 MG tablet Take 1 tablet (5 mg total) by mouth 2 (two) times daily. 01/12/16   Quentin Angstlugbemiga E Jegede, MD  DULoxetine (CYMBALTA) 60 MG capsule TAKE 1 CAPSULE BY MOUTH DAILY. 11/27/15   Quentin Angstlugbemiga E Jegede, MD  folic acid (FOLVITE) 1 MG tablet Take 1 tablet (  1 mg total) by mouth daily. 01/17/16   Quentin Angst, MD  gabapentin (NEURONTIN) 300 MG capsule TAKE 1 CAPSULE BY MOUTH 3 TIMES DAILY. Patient not taking: Reported on 03/06/2016 11/27/15   Quentin Angst, MD  hydroxyurea (HYDREA) 500 MG capsule TAKE 2 CAPSULES BY MOUTH DAILY. MAY TAKE WITH FOOD TO MINIMIZE GI SIDE EFFECTS. 08/08/15   [name], FNP  ibuprofen (ADVIL,MOTRIN) 200 MG tablet Take 400 mg by mouth every 6 (six) hours as needed for moderate pain.    Historical Provider, MD  morphine (MS CONTIN) 30 MG 12 hr tablet TAKE ONE TABLET BY MOUTH  EVERY TWELVE HOURS Patient not taking: Reported on 03/06/2016 02/10/16   Quentin Angst, MD  oxyCODONE-acetaminophen (PERCOCET) 10-325 MG tablet TAKE ONE TABLET EVERY 4 HOURS AS NEEDED FOR pain 02/23/16   Quentin Angst, MD  potassium chloride SA (K-DUR,KLOR-CON) 20 MEQ tablet Take 2 tablets (40 mEq total) by mouth daily. Patient not taking: Reported on 03/06/2016 11/23/15   Altha Harm, MD  Topiramate ER 100 MG CP24 Take 100 mg by mouth at bedtime.    Historical Provider, MD       Physical Exam: BP 106/73 (BP Location: Right Arm)   Pulse 86   Temp 98.8 F (37.1 C) (Oral)   Resp 16   Ht 5' (1.524 m)   Wt 58 kg (127 lb 13.9 oz)   LMP 02/07/2016 (Exact Date)   SpO2 99%   BMI 24.97 kg/m  General appearance: Well-developed, adult female, alert and in moderate distress from pain. Speaking in full sentences Eyes: nonicteric, conjunctiva pink, lids and lashes normal. PERRL.    ENT: No nasal deformity, discharge, epistaxis.  Hearing normal. OP moist without lesions.   Neck: No neck masses.  Trachea midline.  No thyromegaly/tenderness. Lymph: No cervical or supraclavicular lymphadenopathy. Skin: Warm and dry.  No jaundice.  No suspicious rashes or lesions. Cardiac: RRR, nl S1-S2, no murmurs appreciated.  Capillary refill is brisk.  No LE edema.  Radial and DP pulses 2+ and symmetric. Respiratory: Normal respiratory rate and rhythm.  CTAB without rales or wheezes. Abdomen: Abdomen soft.  No TTP. No ascites, distension, hepatosplenomegaly.   MSK: No deformities or effusions.  No cyanosis or clubbing. Neuro: Cranial nerves grossly normal.  Sensation intact to light touch. Speech is fluent.  Muscle strength normal. Ambulating without any difficulty in the room   Psych: Sensorium intact and responding to questions, attention normal.  Behavior appropriate.  Affect blunted by pain.  Judgment and insight appear normal.     Labs on Admission:  I have personally reviewed following  labs and imaging studies: CBC:  Recent Labs Lab 03/05/16 1849 03/06/16 1020  WBC 14.7* 12.7*  NEUTROABS 8.3*  --   HGB 8.7* 7.6*  HCT 25.0* 21.5*  MCV 103.4* 103.1*  PLT 446* 373   Basic Metabolic Panel:  Recent Labs Lab 03/05/16 1849 03/06/16 1020  NA 139 141  K 3.2* 3.3*  CL 108 113*  CO2 26 25  GLUCOSE 82 92  BUN 8 8  CREATININE 0.34* 0.43*  CALCIUM 8.8* 8.1*   GFR: Estimated Creatinine Clearance: 81.2 mL/min (by C-G formula based on SCr of 0.43 mg/dL (L)).  Liver Function Tests:  Recent Labs Lab 03/05/16 1849  AST 47*  ALT 24  ALKPHOS 94  BILITOT 1.9*  PROT 7.6  ALBUMIN 4.0   No results for input(s): LIPASE, AMYLASE in the last 168 hours. No results for input(s): AMMONIA  in the last 168 hours. Coagulation Profile: No results for input(s): INR, PROTIME in the last 168 hours. Cardiac Enzymes:  Recent Labs Lab 03/05/16 1849  TROPONINI <0.03   BNP (last 3 results) No results for input(s): PROBNP in the last 8760 hours. HbA1C: No results for input(s): HGBA1C in the last 72 hours. CBG: No results for input(s): GLUCAP in the last 168 hours. Lipid Profile: No results for input(s): CHOL, HDL, LDLCALC, TRIG, CHOLHDL, LDLDIRECT in the last 72 hours. Thyroid Function Tests: No results for input(s): TSH, T4TOTAL, FREET4, T3FREE, THYROIDAB in the last 72 hours. Anemia Panel:  Recent Labs  03/05/16 1849  RETICCTPCT 5.9*   Sepsis Labs: Invalid input(s): PROCALCITONIN, LACTICIDVEN No results found for this or any previous visit (from the past 240 hour(s)).       Radiological Exams on Admission: Personally reviewed: Dg Chest 2 View  Result Date: 03/05/2016 CLINICAL DATA:  31 year old female with chest pain. Hip and leg pain. Sickle cell crisis. Initial encounter. EXAM: CHEST  2 VIEW COMPARISON:  01/22/2016 chest radiographs and earlier. FINDINGS: Tunneled type right chest central catheter appears stable. Stable lung volumes, at the lower limits of  normal. Stable mild elevation of the left hemidiaphragm. Mild cardiomegaly. Other mediastinal contours are within normal limits. Visualized tracheal air column is within normal limits. Stable pulmonary vascularity without acute edema. No pneumothorax, pleural effusion or confluent pulmonary opacity. Visualized osseous structures appear fairly unremarkable. Negative visible bowel gas pattern. IMPRESSION: No acute cardiopulmonary abnormality. Electronically Signed   By: Odessa Fleming M.D.   On: 03/05/2016 19:15    EKG: Independently reviewed. 03/05/16 at 1858.    No sinus rhythm, 95 bpm Normal axis deviation. No acute ST changes  Assessment/Plan Principal Problem:   Sickle cell anemia with crisis (HCC) Active Problems:   Chronic pain syndrome   MDD (major depressive disorder), recurrent severe, without psychosis (HCC)   Opiate dependence (HCC)   Sickle cell anemia with pain (HCC)    1. Vaso-occlussive sickle cell pain crisis: Will admit per sickle cell protocol for pain control.  No evidence of acute chest at this time. -Weight based low-dose IV hydromorphone PCA with continuous ETCO2 monitoring - Currently holding her home by mouth narcotics given Dilaudid protocol -IV Ketorolac 15mg  scheduled every 6 hours -Hydrate with IVF D5 .45% saline @ 100 cc/hr  -Continue hydroxyurea and folic acid -Diphenhydramine, and bowel regimen - narcan prn - outside cxr 03/05/16 no acute disease - will chk ua, cxr (again), blood cx to r/o possible infectious precipitator for crisis, but does not appear sick/infectious currently. - pt added to sickle cell rounding team.  2. Sickle cell anemia:  Baseline Hgb 7.2-8.8, stablecurrently -Transfuse as needed if Hg drops significantly below baseline.  3. MDD -  Continued home cymbalta, abilify, topimax     DVT prophylaxis: Lovenox 40 Code Status: FULL Family Communication: None present  Disposition Plan: Initiate IV hydromorphone PCA now, Sickle cell team to  take over management in AM. Consults called: None Admission status: INPATIENT, tele      Medical decision making: Patient seen at 9:16 PM on 03/06/2016.    What exists of the patient's chart was reviewed in depth and summarized above.  Clinical condition: currently hemodynamically stable with continuous respiratory monitoring.        Pete Glatter, MD [facility] Pager [phone]

## 2016-03-07 ENCOUNTER — Encounter (HOSPITAL_COMMUNITY): Payer: Self-pay | Admitting: *Deleted

## 2016-03-07 ENCOUNTER — Inpatient Hospital Stay (HOSPITAL_COMMUNITY): Payer: Medicaid Other

## 2016-03-07 DIAGNOSIS — F1120 Opioid dependence, uncomplicated: Secondary | ICD-10-CM

## 2016-03-07 DIAGNOSIS — G894 Chronic pain syndrome: Secondary | ICD-10-CM

## 2016-03-07 LAB — COMPREHENSIVE METABOLIC PANEL
ALT: 25 U/L (ref 14–54)
AST: 42 U/L — AB (ref 15–41)
Albumin: 3.5 g/dL (ref 3.5–5.0)
Alkaline Phosphatase: 87 U/L (ref 38–126)
Anion gap: 4 — ABNORMAL LOW (ref 5–15)
BUN: 5 mg/dL — AB (ref 6–20)
CHLORIDE: 114 mmol/L — AB (ref 101–111)
CO2: 24 mmol/L (ref 22–32)
Calcium: 8.4 mg/dL — ABNORMAL LOW (ref 8.9–10.3)
Glucose, Bld: 114 mg/dL — ABNORMAL HIGH (ref 65–99)
Potassium: 3.6 mmol/L (ref 3.5–5.1)
SODIUM: 142 mmol/L (ref 135–145)
Total Bilirubin: 1.5 mg/dL — ABNORMAL HIGH (ref 0.3–1.2)
Total Protein: 6.5 g/dL (ref 6.5–8.1)

## 2016-03-07 LAB — CBC WITH DIFFERENTIAL/PLATELET
Basophils Absolute: 0.1 10*3/uL (ref 0.0–0.1)
Basophils Relative: 0 %
EOS PCT: 2 %
Eosinophils Absolute: 0.3 10*3/uL (ref 0.0–0.7)
HEMATOCRIT: 20.7 % — AB (ref 36.0–46.0)
Hemoglobin: 7.3 g/dL — ABNORMAL LOW (ref 12.0–15.0)
LYMPHS ABS: 4.1 10*3/uL — AB (ref 0.7–4.0)
Lymphocytes Relative: 28 %
MCH: 34.8 pg — ABNORMAL HIGH (ref 26.0–34.0)
MCHC: 35.3 g/dL (ref 30.0–36.0)
MCV: 98.6 fL (ref 78.0–100.0)
MONO ABS: 1.1 10*3/uL — AB (ref 0.1–1.0)
MONOS PCT: 8 %
Neutro Abs: 8.9 10*3/uL — ABNORMAL HIGH (ref 1.7–7.7)
Neutrophils Relative %: 62 %
Platelets: 382 10*3/uL (ref 150–400)
RBC: 2.1 MIL/uL — ABNORMAL LOW (ref 3.87–5.11)
RDW: 21.8 % — AB (ref 11.5–15.5)
WBC: 14.5 10*3/uL — ABNORMAL HIGH (ref 4.0–10.5)

## 2016-03-07 LAB — URINALYSIS, ROUTINE W REFLEX MICROSCOPIC
BILIRUBIN URINE: NEGATIVE
Glucose, UA: NEGATIVE mg/dL
Ketones, ur: NEGATIVE mg/dL
LEUKOCYTES UA: NEGATIVE
Nitrite: NEGATIVE
Protein, ur: NEGATIVE mg/dL
SPECIFIC GRAVITY, URINE: 1.009 (ref 1.005–1.030)
pH: 6 (ref 5.0–8.0)

## 2016-03-07 LAB — RETICULOCYTES
RBC.: 2.1 MIL/uL — ABNORMAL LOW (ref 3.87–5.11)
RETIC COUNT ABSOLUTE: 386.4 10*3/uL — AB (ref 19.0–186.0)
Retic Ct Pct: 18.4 % — ABNORMAL HIGH (ref 0.4–3.1)

## 2016-03-07 LAB — LACTATE DEHYDROGENASE: LDH: 198 U/L — ABNORMAL HIGH (ref 98–192)

## 2016-03-07 MED ORDER — DIPHENHYDRAMINE HCL 25 MG PO CAPS: 25.0000 mg | ORAL_CAPSULE | ORAL | Status: DC | PRN

## 2016-03-07 MED ORDER — HYDROMORPHONE 1 MG/ML IV SOLN
INTRAVENOUS | Status: DC
  Administered 2016-03-07: 25 mg via INTRAVENOUS
  Filled 2016-03-07: qty 25

## 2016-03-07 MED ORDER — SODIUM CHLORIDE 0.9% FLUSH: 9.0000 mL | INTRAVENOUS | Status: DC | PRN

## 2016-03-07 MED ORDER — HYDROMORPHONE HCL 2 MG/ML IJ SOLN
2.0000 mg | INTRAMUSCULAR | Status: DC | PRN
  Administered 2016-03-07 – 2016-03-08 (×10): 2 mg via INTRAVENOUS
  Filled 2016-03-07 (×10): qty 1

## 2016-03-07 MED ORDER — DIPHENHYDRAMINE HCL 50 MG/ML IJ SOLN: 12.5000 mg | Freq: Four times a day (QID) | INTRAMUSCULAR | Status: DC | PRN

## 2016-03-07 MED ORDER — DIPHENHYDRAMINE HCL 12.5 MG/5ML PO ELIX
12.5000 mg | ORAL_SOLUTION | Freq: Four times a day (QID) | ORAL | Status: DC | PRN
Start: 2016-03-07 — End: 2016-03-07
  Administered 2016-03-07: 12.5 mg via ORAL
  Filled 2016-03-07: qty 5

## 2016-03-07 MED ORDER — HYDROMORPHONE 1 MG/ML IV SOLN
INTRAVENOUS | Status: DC
  Administered 2016-03-07: 15.6 mg via INTRAVENOUS
  Administered 2016-03-07: 0.5 mg via INTRAVENOUS
  Administered 2016-03-07: 7.5 mg via INTRAVENOUS
  Administered 2016-03-07: 9 mg via INTRAVENOUS
  Administered 2016-03-07: 6 mg via INTRAVENOUS
  Administered 2016-03-08: 9.5 mg via INTRAVENOUS
  Administered 2016-03-08: 3 mg via INTRAVENOUS
  Administered 2016-03-08 (×2): 2 mg via INTRAVENOUS
  Administered 2016-03-08: 4.34 mg via INTRAVENOUS
  Administered 2016-03-08: 6.5 mg via INTRAVENOUS
  Filled 2016-03-07: qty 25

## 2016-03-07 MED ORDER — OXYCODONE HCL 5 MG PO TABS: 5.0000 mg | ORAL_TABLET | ORAL | Status: DC | PRN

## 2016-03-07 MED ORDER — ONDANSETRON HCL 4 MG/2ML IJ SOLN: 4.0000 mg | Freq: Four times a day (QID) | INTRAMUSCULAR | Status: DC | PRN

## 2016-03-07 MED ORDER — DEXTROSE-NACL 5-0.45 % IV SOLN
INTRAVENOUS | Status: DC
  Administered 2016-03-07: 14:00:00 via INTRAVENOUS
  Administered 2016-03-08: 1000 mL via INTRAVENOUS
  Administered 2016-03-08: 01:00:00 via INTRAVENOUS

## 2016-03-07 MED ORDER — KETOROLAC TROMETHAMINE 15 MG/ML IJ SOLN
30.0000 mg | Freq: Four times a day (QID) | INTRAMUSCULAR | Status: DC
  Administered 2016-03-07 – 2016-03-08 (×6): 30 mg via INTRAVENOUS
  Filled 2016-03-07 (×7): qty 2

## 2016-03-07 MED ORDER — TOPIRAMATE 25 MG PO TABS
50.0000 mg | ORAL_TABLET | Freq: Two times a day (BID) | ORAL | Status: DC
  Administered 2016-03-07 – 2016-03-08 (×4): 50 mg via ORAL
  Filled 2016-03-07 (×5): qty 2

## 2016-03-07 MED ORDER — NALOXONE HCL 0.4 MG/ML IJ SOLN: 0.4000 mg | INTRAMUSCULAR | Status: DC | PRN

## 2016-03-07 MED ORDER — OXYCODONE-ACETAMINOPHEN 5-325 MG PO TABS
1.0000 | ORAL_TABLET | ORAL | Status: DC | PRN
Start: 2016-03-07 — End: 2016-03-08

## 2016-03-07 MED ORDER — OXYCODONE-ACETAMINOPHEN 10-325 MG PO TABS: 1.0000 | ORAL_TABLET | ORAL | Status: DC | PRN

## 2016-03-07 NOTE — Progress Notes (Addendum)
Nursing Note: Pt has refused to allow blood cultures to be drawn.On -call paged and made aware.wbb

## 2016-03-07 NOTE — Progress Notes (Signed)
Patient ID: Brittany Foster, female   DOB: 05/17/1985, 31 y.o.   MRN: [id number] Subjective:  Brittany Foster is a 31 y.o. female with a past medical history significant for sickle cell disease admitted for sickle cell pain crises. She said her pain is still at 8/10, localized to her legs and chest area. She has no cough, no SOB, no fever. Her daughter is present with her in bed.  Objective:  Vital signs in last 24 hours:  Vitals:   03/07/16 0421 03/07/16 0800 03/07/16 0839 03/07/16 1012  BP: 98/79   (!) 92/56  Pulse: 79   75  Resp: 14 14 14 17   Temp: 97.9 F (36.6 C)   97.9 F (36.6 C)  TempSrc: Oral   Oral  SpO2: 99% 97% 97% 94%  Weight: 58.8 kg (129 lb 10.1 oz)     Height:        Intake/Output from previous day:  No intake or output data in the 24 hours ending 03/07/16 1146  Physical Exam: General: Alert, awake, oriented x3, in no acute distress.  HEENT: [place]/AT PEERL, EOMI Neck: Trachea midline,  no masses, no thyromegal,y no JVD, no carotid bruit OROPHARYNX:  Moist, No exudate/ erythema/lesions.  Heart: Regular rate and rhythm, without murmurs, rubs, gallops, PMI non-displaced, no heaves or thrills on palpation.  Lungs: Clear to auscultation, no wheezing or rhonchi noted. No increased vocal fremitus resonant to percussion  Abdomen: Soft, nontender, nondistended, positive bowel sounds, no masses no hepatosplenomegaly noted..  Neuro: No focal neurological deficits noted cranial nerves II through XII grossly intact. DTRs 2+ bilaterally upper and lower extremities. Strength 5 out of 5 in bilateral upper and lower extremities. Musculoskeletal: No warm swelling or erythema around joints, no spinal tenderness noted. Psychiatric: Patient alert and oriented x3, good insight and cognition, good recent to remote recall. Lymph node survey: No cervical axillary or inguinal lymphadenopathy noted.  Lab Results:  Basic Metabolic Panel:    Component Value Date/Time   NA 142 03/07/2016 0138    K 3.6 03/07/2016 0138   CL 114 (H) 03/07/2016 0138   CO2 24 03/07/2016 0138   BUN 5 (L) 03/07/2016 0138   CREATININE <0.30 (L) 03/07/2016 0138   GLUCOSE 114 (H) 03/07/2016 0138   CALCIUM 8.4 (L) 03/07/2016 0138   CBC:    Component Value Date/Time   WBC 14.5 (H) 03/07/2016 0138   HGB 7.3 (L) 03/07/2016 0138   HCT 20.7 (L) 03/07/2016 0138   PLT 382 03/07/2016 0138   MCV 98.6 03/07/2016 0138   NEUTROABS 8.9 (H) 03/07/2016 0138   LYMPHSABS 4.1 (H) 03/07/2016 0138   MONOABS 1.1 (H) 03/07/2016 0138   EOSABS 0.3 03/07/2016 0138   BASOSABS 0.1 03/07/2016 0138    No results found for this or any previous visit (from the past 240 hour(s)).  Studies/Results: Dg Chest 2 View  Result Date: 03/07/2016 CLINICAL DATA:  Sickle cell pain crisis.  Shortness of breath EXAM: CHEST  2 VIEW COMPARISON:  Two days ago FINDINGS: New blunting of the right costophrenic sulcus. No consolidating airspace disease. Mild linear opacity overlapping the posterior heart on the lateral projection is likely atelectasis. Chronic interstitial coarsening is stable. Prominent formed stool in the upper abdomen. Normal heart size and mediastinal contours. Central line on the right with tip at the upper right atrium. IMPRESSION: Trace right pleural fluid that is new from 2 days ago. No consolidating airspace disease. Electronically Signed   By: Marnee Spring M.D.   On: 03/07/2016  09:06   Dg Chest 2 View  Result Date: 03/05/2016 CLINICAL DATA:  31 year old female with chest pain. Hip and leg pain. Sickle cell crisis. Initial encounter. EXAM: CHEST  2 VIEW COMPARISON:  01/22/2016 chest radiographs and earlier. FINDINGS: Tunneled type right chest central catheter appears stable. Stable lung volumes, at the lower limits of normal. Stable mild elevation of the left hemidiaphragm. Mild cardiomegaly. Other mediastinal contours are within normal limits. Visualized tracheal air column is within normal limits. Stable pulmonary vascularity  without acute edema. No pneumothorax, pleural effusion or confluent pulmonary opacity. Visualized osseous structures appear fairly unremarkable. Negative visible bowel gas pattern. IMPRESSION: No acute cardiopulmonary abnormality. Electronically Signed   By: Odessa FlemingH  Hall M.D.   On: 03/05/2016 19:15    Medications: Scheduled Meds: . ARIPiprazole  5 mg Oral BID  . DULoxetine  60 mg Oral Daily  . enoxaparin (LOVENOX) injection  40 mg Subcutaneous Q24H  . folic acid  1 mg Oral Daily  . HYDROmorphone   Intravenous Q4H  . hydroxyurea  500 mg Oral Daily  . ketorolac  30 mg Intravenous Q6H  . senna-docusate  1 tablet Oral BID  . topiramate  50 mg Oral BID   Continuous Infusions: PRN Meds:.alum & mag hydroxide-simeth, diphenhydrAMINE, HYDROmorphone (DILAUDID) injection, ibuprofen, naloxone **AND** sodium chloride flush, ondansetron (ZOFRAN) IV, oxyCODONE-acetaminophen **AND** oxyCODONE, polyethylene glycol, promethazine **OR** promethazine  Consultants:  None  Procedures:  None  Antibiotics:  None  Assessment/Plan: Principal Problem:   Sickle cell anemia with crisis (HCC) Active Problems:   Chronic pain syndrome   MDD (major depressive disorder), recurrent severe, without psychosis (HCC)   Opiate dependence (HCC)   Sickle cell anemia with pain (HCC)   1. Hb SS with crisis: Patient still having pain 8/10, continue IV dilaudid PCA, add clinician assisted doses, increase IV toradol to 30 mg Q6H, start IVF D5.45% @ 100 cc/hr. 2. Leukocytosis: No evidence of infection, continue to monitor 3. Chronic Anemia: Hb is 7.3 today down from 8.7 2 days ago. Will continue to monitor 4. Chronic pain: Opiate tolerant, continue home medications 5. Major Depressive Disorder: Continue home medications  Code Status: Full Code Family Communication: N/A Disposition Plan: Not yet ready for discharge  Khameron Gruenwald  If 7PM-7AM, please contact night-coverage.  03/07/2016, 11:46 AM  LOS: 1 day

## 2016-03-08 ENCOUNTER — Encounter (HOSPITAL_COMMUNITY): Payer: Self-pay | Admitting: *Deleted

## 2016-03-08 DIAGNOSIS — F332 Major depressive disorder, recurrent severe without psychotic features: Secondary | ICD-10-CM

## 2016-03-08 LAB — CBC WITH DIFFERENTIAL/PLATELET
Basophils Absolute: 0 10*3/uL (ref 0.0–0.1)
Basophils Relative: 0 %
EOS ABS: 0.4 10*3/uL (ref 0.0–0.7)
Eosinophils Relative: 3 %
HEMATOCRIT: 20.2 % — AB (ref 36.0–46.0)
Hemoglobin: 7.1 g/dL — ABNORMAL LOW (ref 12.0–15.0)
LYMPHS ABS: 3.6 10*3/uL (ref 0.7–4.0)
Lymphocytes Relative: 30 %
MCH: 35.1 pg — ABNORMAL HIGH (ref 26.0–34.0)
MCHC: 35.1 g/dL (ref 30.0–36.0)
MCV: 100 fL (ref 78.0–100.0)
MONO ABS: 1.2 10*3/uL — AB (ref 0.1–1.0)
MONOS PCT: 10 %
NEUTROS ABS: 6.7 10*3/uL (ref 1.7–7.7)
Neutrophils Relative %: 57 %
PLATELETS: 332 10*3/uL (ref 150–400)
RBC: 2.02 MIL/uL — AB (ref 3.87–5.11)
RDW: 21.3 % — ABNORMAL HIGH (ref 11.5–15.5)
WBC: 11.9 10*3/uL — AB (ref 4.0–10.5)

## 2016-03-08 LAB — HIGH SENSITIVITY CRP: CRP HIGH SENSITIVITY: 10.62 mg/L — AB (ref 0.00–3.00)

## 2016-03-08 MED ORDER — SODIUM CHLORIDE 0.9 % IV SOLN
25.0000 mg | Freq: Once | INTRAVENOUS | Status: AC
  Administered 2016-03-08: 25 mg via INTRAVENOUS
  Filled 2016-03-08: qty 0.5

## 2016-03-08 MED ORDER — DIPHENHYDRAMINE HCL 12.5 MG/5ML PO ELIX
25.0000 mg | ORAL_SOLUTION | ORAL | Status: DC | PRN
  Administered 2016-03-08: 25 mg via ORAL
  Filled 2016-03-08: qty 10

## 2016-03-08 MED ORDER — HEPARIN SOD (PORK) LOCK FLUSH 100 UNIT/ML IV SOLN: 250.0000 [IU] | INTRAVENOUS | Status: DC | PRN

## 2016-03-08 NOTE — Progress Notes (Signed)
Patient discharged to home, discharge instructions reviewed by Hulda Marinonna Alviar RN, patients PICC line flush with NS and Heparin.

## 2016-03-08 NOTE — Progress Notes (Signed)
Patient d/c instructions given to patient, verbalized understanding. Patient will leave around 1900. Per Dr. Hyman HopesJegede will leave PICC line, dsg changed this am.

## 2016-03-08 NOTE — Progress Notes (Signed)
Endorsed to Cindy H. 

## 2016-03-08 NOTE — Discharge Instructions (Signed)
Sickle Cell Anemia, Adult °Sickle cell anemia is a condition in which red blood cells have an abnormal “sickle” shape. This abnormal shape shortens the cells’ life span, which results in a lower than normal concentration of red blood cells in the blood. The sickle shape also causes the cells to clump together and block free blood flow through the blood vessels. As a result, the tissues and organs of the body do not receive enough oxygen. Sickle cell anemia causes organ damage and pain and increases the risk of infection. °What are the causes? °Sickle cell anemia is a genetic disorder. Those who receive two copies of the gene have the condition, and those who receive one copy have the trait. °What increases the risk? °The sickle cell gene is most common in people whose families originated in [place]. Other areas of the globe where sickle cell trait occurs include the Mediterranean, [place] and [place], the [place], and the [place]. °What are the signs or symptoms? °· Pain, especially in the extremities, back, chest, or abdomen (common). The pain may start suddenly or may develop following an illness, especially if there is dehydration. Pain can also occur due to overexertion or exposure to extreme temperature changes. °· Frequent severe bacterial infections, especially certain types of pneumonia and meningitis. °· Pain and swelling in the hands and feet. °· Decreased activity. °· Loss of appetite. °· Change in behavior. °· Headaches. °· Seizures. °· Shortness of breath or difficulty breathing. °· Vision changes. °· Skin ulcers. °Those with the trait may not have symptoms or they may have mild symptoms. °How is this diagnosed? °Sickle cell anemia is diagnosed with blood tests that demonstrate the genetic trait. It is often diagnosed during the newborn period, due to mandatory testing nationwide. A variety of blood tests, X-rays, CT scans, MRI scans, ultrasounds, and lung function tests may also be done to  monitor the condition. °How is this treated? °Sickle cell anemia may be treated with: °· Medicines. You may be given pain medicines, antibiotic medicines (to treat and prevent infections) or medicines to increase the production of certain types of hemoglobin. °· Fluids. °· Oxygen. °· Blood transfusions. ° °Follow these instructions at home: °· Drink enough fluid to keep your urine clear or pale yellow. Increase your fluid intake in hot weather and during exercise. °· Do not smoke. Smoking lowers oxygen levels in the blood. °· Only take over-the-counter or prescription medicines for pain, fever, or discomfort as directed by your health care provider. °· Take antibiotics as directed by your health care provider. Make sure you finish them it even if you start to feel better. °· Take supplements as directed by your health care provider. °· Consider wearing a medical alert bracelet. This tells anyone caring for you in an emergency of your condition. °· When traveling, keep your medical information, health care provider's names, and the medicines you take with you at all times. °· If you develop a fever, do not take medicines to reduce the fever right away. This could cover up a problem that is developing. Notify your health care provider. °· Keep all follow-up appointments with your health care provider. Sickle cell anemia requires regular medical care. °Contact a health care provider if: °You have a fever. °Get help right away if: °· You feel dizzy or faint. °· You have new abdominal pain, especially on the left side near the stomach area. °· You develop a persistent, often uncomfortable and painful penile erection (priapism). If this is not   treated immediately it will lead to impotence. °· You have numbness your arms or legs or you have a hard time moving them. °· You have a hard time with speech. °· You have a fever or persistent symptoms for more than 2-3 days. °· You have a fever and your symptoms suddenly get  worse. °· You have signs or symptoms of infection. These include: °? Chills. °? Abnormal tiredness (lethargy). °? Irritability. °? Poor eating. °? Vomiting. °· You develop pain that is not helped with medicine. °· You develop shortness of breath. °· You have pain in your chest. °· You are coughing up pus-like or bloody sputum. °· You develop a stiff neck. °· Your feet or hands swell or have pain. °· Your abdomen appears bloated. °· You develop joint pain. °This information is not intended to replace advice given to you by your health care provider. Make sure you discuss any questions you have with your health care provider. °Document Released: 05/27/2005 Document Revised: 09/06/2015 Document Reviewed: 09/28/2012 °Elsevier Interactive Patient Education © 2017 [employer]. ° °

## 2016-03-08 NOTE — Discharge Summary (Signed)
Physician Discharge Summary  Brittany Foster WUJ:[id number] DOB: 1985-12-05 DOA: 03/06/2016  PCP: Jeanann Lewandowsky, MD  Admit date: 03/06/2016  Discharge date: 03/08/2016  Discharge Diagnoses:  Principal Problem:   Sickle cell anemia with crisis (HCC) Active Problems:   Chronic pain syndrome   MDD (major depressive disorder), recurrent severe, without psychosis (HCC)   Opiate dependence (HCC)   Sickle cell anemia with pain (HCC)   Discharge Condition: Stable  Disposition:  Follow-up Information    Brennen Gardiner, MD Follow up in 2 day(s).   Specialty:  Internal Medicine Contact information: [address] [place] [address] [phone]           Diet: Regular  Wt Readings from Last 3 Encounters:  03/08/16 59.2 kg (130 lb 8.2 oz)  03/06/16 57.1 kg (125 lb 12.8 oz)  02/26/16 59 kg (130 lb)    History of present illness:  Brittany Foster is a 31 y.o. female with a past medical history significant for sickle cell disease and  who presents with complaints of sickle cell pain during Wednesday night with complaints of chest pain, described as sharp shooting pains to her chest, bilateral leg pains and hip pains. She states she was taking her meds regularly including her by mouth narcotics including MS Contin and short acting with no relief.  She was seen last night at [facility] and it admitted and then transferred to our facility, arrived tonight, for further evaluation and care.  She states her pains about the same. Currently, but she denies any acute shortness of breath is translating in the room without any difficulties and about to shower.   She was seen up on the hospital floor. Her friend is present. Patient was last admitted and discharged on 02/26/2016.  [place]given prn dilaudid iv, admitted to inpt [facility] and than transferred to our facility -Na 139, K 3.2, Cr 0.34 (baseline 0.3), WBC 14.7, than trended down to12.7, Hgb 8.7, than trended to  7.6  Hospital Course:  Patient was admitted for Sickle Cell Pain Crisis. She did well on the standard sickle cell pain management protocol. She had no fever, pain subsided, Hb remained stable throughout admission. She was discharged home in hemodynamically stable condition, to follow up with me in the clinic in 2 days for discussions around possibility of going for Detox and resetting her pain receptors. Patient verbalized understanding.  Discharge Exam: Vitals:   03/08/16 0751 03/08/16 0955  BP:  99/62  Pulse:  90  Resp: 15 16  Temp:  98.2 F (36.8 C)   Vitals:   03/08/16 0349 03/08/16 0502 03/08/16 0751 03/08/16 0955  BP:  (!) 100/58  99/62  Pulse:  77  90  Resp: 15 18 15 16   Temp:  98.3 F (36.8 C)  98.2 F (36.8 C)  TempSrc:  Oral  Oral  SpO2: 97% 99% 98% 100%  Weight:  59.2 kg (130 lb 8.2 oz)    Height:       General appearance : Awake, alert, not in any distress. Speech Clear. Not toxic looking HEENT: Atraumatic and Normocephalic, pupils equally reactive to light and accomodation Neck: Supple, no JVD. No cervical lymphadenopathy.  Chest: Good air entry bilaterally, no added sounds  CVS: S1 S2 regular, no murmurs.  Abdomen: Bowel sounds present, Non tender and not distended with no gaurding, rigidity or rebound. Extremities: B/L Lower Ext shows no edema, both legs are warm to touch Neurology: Awake alert, and oriented X 3, CN II-XII intact,  Non focal Skin: No Rash  Discharge Instructions  Discharge Instructions    Diet - low sodium heart healthy    Complete by:  As directed    Increase activity slowly    Complete by:  As directed      Allergies as of 03/08/2016      Reactions   Ultram [tramadol] Other (See Comments)   seizures   Zofran [ondansetron Hcl] Nausea And Vomiting   Buprenorphine Hcl Hives, Rash   Shaking Tolerates Percocet, Norco, and buprenorphine   Morphine And Related Hives, Rash, Other (See Comments)   Shaking Tolerates Percocet, Norco,  Dilaudid, and buprenorphine   Tape Rash      Medication List    TAKE these medications   ARIPiprazole 10 MG tablet Commonly known as:  ABILIFY 1 tablet daily   DULoxetine 60 MG capsule Commonly known as:  CYMBALTA TAKE 1 CAPSULE BY MOUTH DAILY.   folic acid 1 MG tablet Commonly known as:  FOLVITE Take 1 tablet (1 mg total) by mouth daily.   hydroxyurea 500 MG capsule Commonly known as:  HYDREA TAKE 2 CAPSULES BY MOUTH DAILY. MAY TAKE WITH FOOD TO MINIMIZE GI SIDE EFFECTS. What changed:  how much to take  how to take this  when to take this  additional instructions   ibuprofen 200 MG tablet Commonly known as:  ADVIL,MOTRIN Take 400 mg by mouth every 6 (six) hours as needed for moderate pain.   morphine 30 MG 12 hr tablet Commonly known as:  MS CONTIN TAKE ONE TABLET BY MOUTH EVERY TWELVE HOURS   oxyCODONE-acetaminophen 10-325 MG tablet Commonly known as:  PERCOCET TAKE ONE TABLET EVERY 4 HOURS AS NEEDED FOR pain What changed:  how much to take  how to take this  when to take this  reasons to take this  additional instructions   Topiramate ER 100 MG Cp24 Take 100 mg by mouth at bedtime.      The results of significant diagnostics from this hospitalization (including imaging, microbiology, ancillary and laboratory) are listed below for reference.    Significant Diagnostic Studies: Dg Chest 2 View  Result Date: 03/07/2016 CLINICAL DATA:  Sickle cell pain crisis.  Shortness of breath EXAM: CHEST  2 VIEW COMPARISON:  Two days ago FINDINGS: New blunting of the right costophrenic sulcus. No consolidating airspace disease. Mild linear opacity overlapping the posterior heart on the lateral projection is likely atelectasis. Chronic interstitial coarsening is stable. Prominent formed stool in the upper abdomen. Normal heart size and mediastinal contours. Central line on the right with tip at the upper right atrium. IMPRESSION: Trace right pleural fluid that is new  from 2 days ago. No consolidating airspace disease. Electronically Signed   By: Marnee SpringJonathon  Watts M.D.   On: 03/07/2016 09:06   Dg Chest 2 View  Result Date: 03/05/2016 CLINICAL DATA:  31 year old female with chest pain. Hip and leg pain. Sickle cell crisis. Initial encounter. EXAM: CHEST  2 VIEW COMPARISON:  01/22/2016 chest radiographs and earlier. FINDINGS: Tunneled type right chest central catheter appears stable. Stable lung volumes, at the lower limits of normal. Stable mild elevation of the left hemidiaphragm. Mild cardiomegaly. Other mediastinal contours are within normal limits. Visualized tracheal air column is within normal limits. Stable pulmonary vascularity without acute edema. No pneumothorax, pleural effusion or confluent pulmonary opacity. Visualized osseous structures appear fairly unremarkable. Negative visible bowel gas pattern. IMPRESSION: No acute cardiopulmonary abnormality. Electronically Signed   By: Odessa FlemingH  Hall M.D.   On:  03/05/2016 19:15    Microbiology: No results found for this or any previous visit (from the past 240 hour(s)).   Labs: Basic Metabolic Panel:  Recent Labs Lab 03/05/16 1849 03/06/16 1020 03/07/16 0138  NA 139 141 142  K 3.2* 3.3* 3.6  CL 108 113* 114*  CO2 26 25 24   GLUCOSE 82 92 114*  BUN 8 8 5*  CREATININE 0.34* 0.43* <0.30*  CALCIUM 8.8* 8.1* 8.4*   Liver Function Tests:  Recent Labs Lab 03/05/16 1849 03/07/16 0138  AST 47* 42*  ALT 24 25  ALKPHOS 94 87  BILITOT 1.9* 1.5*  PROT 7.6 6.5  ALBUMIN 4.0 3.5   No results for input(s): LIPASE, AMYLASE in the last 168 hours. No results for input(s): AMMONIA in the last 168 hours. CBC:  Recent Labs Lab 03/05/16 1849 03/06/16 1020 03/07/16 0138 03/08/16 0525  WBC 14.7* 12.7* 14.5* 11.9*  NEUTROABS 8.3*  --  8.9* 6.7  HGB 8.7* 7.6* 7.3* 7.1*  HCT 25.0* 21.5* 20.7* 20.2*  MCV 103.4* 103.1* 98.6 100.0  PLT 446* 373 382 332   Cardiac Enzymes:  Recent Labs Lab 03/05/16 1849   TROPONINI <0.03   BNP: Invalid input(s): POCBNP CBG: No results for input(s): GLUCAP in the last 168 hours.  Time coordinating discharge: 50 minutes  Signed:  Jeanann Lewandowsky  [facility] 03/08/2016, 10:49 AM

## 2016-03-10 ENCOUNTER — Non-Acute Institutional Stay (HOSPITAL_COMMUNITY)
Admission: AD | Admit: 2016-03-10 | Discharge: 2016-03-10 | Disposition: A | Discharge status: Home / Self Care | Payer: Medicaid Other | Source: Ambulatory Visit | Attending: Internal Medicine | Admitting: Internal Medicine

## 2016-03-10 ENCOUNTER — Ambulatory Visit (INDEPENDENT_AMBULATORY_CARE_PROVIDER_SITE_OTHER): Payer: Medicaid Other | Admitting: Internal Medicine

## 2016-03-10 ENCOUNTER — Encounter: Payer: Self-pay | Admitting: Internal Medicine

## 2016-03-10 VITALS — BP 104/58 | HR 94 | Temp 98.3°F | Resp 18 | Ht 60.0 in | Wt 126.0 lb

## 2016-03-10 DIAGNOSIS — M25551 Pain in right hip: Secondary | ICD-10-CM | POA: Insufficient documentation

## 2016-03-10 DIAGNOSIS — Z79891 Long term (current) use of opiate analgesic: Secondary | ICD-10-CM | POA: Diagnosis not present

## 2016-03-10 DIAGNOSIS — Z791 Long term (current) use of non-steroidal anti-inflammatories (NSAID): Secondary | ICD-10-CM | POA: Diagnosis not present

## 2016-03-10 DIAGNOSIS — D5700 Hb-SS disease with crisis, unspecified: Secondary | ICD-10-CM | POA: Diagnosis not present

## 2016-03-10 DIAGNOSIS — G894 Chronic pain syndrome: Secondary | ICD-10-CM

## 2016-03-10 DIAGNOSIS — Z309 Encounter for contraceptive management, unspecified: Secondary | ICD-10-CM | POA: Diagnosis not present

## 2016-03-10 DIAGNOSIS — Z832 Family history of diseases of the blood and blood-forming organs and certain disorders involving the immune mechanism: Secondary | ICD-10-CM | POA: Insufficient documentation

## 2016-03-10 DIAGNOSIS — D571 Sickle-cell disease without crisis: Secondary | ICD-10-CM

## 2016-03-10 DIAGNOSIS — R079 Chest pain, unspecified: Secondary | ICD-10-CM | POA: Insufficient documentation

## 2016-03-10 DIAGNOSIS — Z79899 Other long term (current) drug therapy: Secondary | ICD-10-CM | POA: Insufficient documentation

## 2016-03-10 MED ORDER — HEPARIN SOD (PORK) LOCK FLUSH 100 UNIT/ML IV SOLN
250.0000 [IU] | INTRAVENOUS | Status: AC | PRN
  Administered 2016-03-10: 250 [IU]
  Filled 2016-03-10: qty 5

## 2016-03-10 MED ORDER — SODIUM CHLORIDE 0.9% FLUSH
10.0000 mL | INTRAVENOUS | Status: AC | PRN
  Administered 2016-03-10: 10 mL

## 2016-03-10 MED ORDER — ONDANSETRON HCL 4 MG/2ML IJ SOLN: 4.0000 mg | Freq: Four times a day (QID) | INTRAMUSCULAR | Status: DC | PRN

## 2016-03-10 MED ORDER — SODIUM CHLORIDE 0.9% FLUSH: 9.0000 mL | INTRAVENOUS | Status: DC | PRN

## 2016-03-10 MED ORDER — NALOXONE HCL 0.4 MG/ML IJ SOLN: 0.4000 mg | INTRAMUSCULAR | Status: DC | PRN

## 2016-03-10 MED ORDER — OXYCODONE-ACETAMINOPHEN 10-325 MG PO TABS
1.0000 | ORAL_TABLET | ORAL | 0 refills | Status: DC | PRN
Start: 2016-03-10 — End: 2016-03-24

## 2016-03-10 MED ORDER — KETOROLAC TROMETHAMINE 30 MG/ML IJ SOLN
30.0000 mg | Freq: Four times a day (QID) | INTRAMUSCULAR | Status: DC
  Administered 2016-03-10: 30 mg via INTRAVENOUS
  Filled 2016-03-10: qty 1

## 2016-03-10 MED ORDER — MORPHINE SULFATE ER 30 MG PO TBCR: 30.0000 mg | EXTENDED_RELEASE_TABLET | Freq: Two times a day (BID) | ORAL | 0 refills | Status: DC

## 2016-03-10 MED ORDER — DIPHENHYDRAMINE HCL 50 MG/ML IJ SOLN
25.0000 mg | INTRAMUSCULAR | Status: DC | PRN
  Filled 2016-03-10: qty 0.5

## 2016-03-10 MED ORDER — DEXTROSE-NACL 5-0.45 % IV SOLN
INTRAVENOUS | Status: DC
  Administered 2016-03-10: 11:00:00 via INTRAVENOUS

## 2016-03-10 MED ORDER — HYDROMORPHONE 1 MG/ML IV SOLN
INTRAVENOUS | Status: DC
  Administered 2016-03-10: 10.5 mg via INTRAVENOUS
  Administered 2016-03-10: 25 mg via INTRAVENOUS
  Filled 2016-03-10: qty 25

## 2016-03-10 MED ORDER — SENNOSIDES-DOCUSATE SODIUM 8.6-50 MG PO TABS: 1.0000 | ORAL_TABLET | Freq: Two times a day (BID) | ORAL | Status: DC

## 2016-03-10 MED ORDER — POLYETHYLENE GLYCOL 3350 17 G PO PACK: 17.0000 g | PACK | Freq: Every day | ORAL | Status: DC | PRN

## 2016-03-10 MED ORDER — MEDROXYPROGESTERONE ACETATE 150 MG/ML IM SUSP
150.0000 mg | Freq: Once | INTRAMUSCULAR | Status: AC
  Administered 2016-03-10: 150 mg via INTRAMUSCULAR

## 2016-03-10 MED ORDER — HYDROMORPHONE HCL 2 MG/ML IJ SOLN: 1.0000 mg | INTRAMUSCULAR | Status: DC | PRN

## 2016-03-10 MED ORDER — DIPHENHYDRAMINE HCL 25 MG PO CAPS: 25.0000 mg | ORAL_CAPSULE | ORAL | Status: DC | PRN

## 2016-03-10 NOTE — Patient Instructions (Signed)
Sickle Cell Anemia, Adult °Sickle cell anemia is a condition in which red blood cells have an abnormal “sickle” shape. This abnormal shape shortens the cells’ life span, which results in a lower than normal concentration of red blood cells in the blood. The sickle shape also causes the cells to clump together and block free blood flow through the blood vessels. As a result, the tissues and organs of the body do not receive enough oxygen. Sickle cell anemia causes organ damage and pain and increases the risk of infection. °What are the causes? °Sickle cell anemia is a genetic disorder. Those who receive two copies of the gene have the condition, and those who receive one copy have the trait. °What increases the risk? °The sickle cell gene is most common in people whose families originated in [place]. Other areas of the globe where sickle cell trait occurs include the Mediterranean, [place] and [place], the [place], and the [place]. °What are the signs or symptoms? °· Pain, especially in the extremities, back, chest, or abdomen (common). The pain may start suddenly or may develop following an illness, especially if there is dehydration. Pain can also occur due to overexertion or exposure to extreme temperature changes. °· Frequent severe bacterial infections, especially certain types of pneumonia and meningitis. °· Pain and swelling in the hands and feet. °· Decreased activity. °· Loss of appetite. °· Change in behavior. °· Headaches. °· Seizures. °· Shortness of breath or difficulty breathing. °· Vision changes. °· Skin ulcers. °Those with the trait may not have symptoms or they may have mild symptoms. °How is this diagnosed? °Sickle cell anemia is diagnosed with blood tests that demonstrate the genetic trait. It is often diagnosed during the newborn period, due to mandatory testing nationwide. A variety of blood tests, X-rays, CT scans, MRI scans, ultrasounds, and lung function tests may also be done to  monitor the condition. °How is this treated? °Sickle cell anemia may be treated with: °· Medicines. You may be given pain medicines, antibiotic medicines (to treat and prevent infections) or medicines to increase the production of certain types of hemoglobin. °· Fluids. °· Oxygen. °· Blood transfusions. ° °Follow these instructions at home: °· Drink enough fluid to keep your urine clear or pale yellow. Increase your fluid intake in hot weather and during exercise. °· Do not smoke. Smoking lowers oxygen levels in the blood. °· Only take over-the-counter or prescription medicines for pain, fever, or discomfort as directed by your health care provider. °· Take antibiotics as directed by your health care provider. Make sure you finish them it even if you start to feel better. °· Take supplements as directed by your health care provider. °· Consider wearing a medical alert bracelet. This tells anyone caring for you in an emergency of your condition. °· When traveling, keep your medical information, health care provider's names, and the medicines you take with you at all times. °· If you develop a fever, do not take medicines to reduce the fever right away. This could cover up a problem that is developing. Notify your health care provider. °· Keep all follow-up appointments with your health care provider. Sickle cell anemia requires regular medical care. °Contact a health care provider if: °You have a fever. °Get help right away if: °· You feel dizzy or faint. °· You have new abdominal pain, especially on the left side near the stomach area. °· You develop a persistent, often uncomfortable and painful penile erection (priapism). If this is not   treated immediately it will lead to impotence. °· You have numbness your arms or legs or you have a hard time moving them. °· You have a hard time with speech. °· You have a fever or persistent symptoms for more than 2-3 days. °· You have a fever and your symptoms suddenly get  worse. °· You have signs or symptoms of infection. These include: °? Chills. °? Abnormal tiredness (lethargy). °? Irritability. °? Poor eating. °? Vomiting. °· You develop pain that is not helped with medicine. °· You develop shortness of breath. °· You have pain in your chest. °· You are coughing up pus-like or bloody sputum. °· You develop a stiff neck. °· Your feet or hands swell or have pain. °· Your abdomen appears bloated. °· You develop joint pain. °This information is not intended to replace advice given to you by your health care provider. Make sure you discuss any questions you have with your health care provider. °Document Released: 05/27/2005 Document Revised: 09/06/2015 Document Reviewed: 09/28/2012 °Elsevier Interactive Patient Education © 2017 [employer] ° °

## 2016-03-10 NOTE — H&P (Signed)
[facility] History and Physical  Brittany Foster WGN:[id number] DOB: 05-29-1985 DOA: 03/10/2016  PCP: Brittany Lewandowsky, MD   Chief Complaint:  Chief Complaint  Patient presents with  . Sickle Cell Pain Crisis   HPI: Brittany Foster is a 31 y.o. female with history of sickle cell disease was initially seen here today at the outpatient clinic for a hospital discharge follow up visit. Patient was admitted recently for pain crisis, discharged on 03/08/2016. She is here today complaining of 7/10 pain in her hips and lower back similar to her pain crisis. She denies any fever. No urinary symptom. She has not had pap smear in the recent years, last one probably 11 years ago per patient. She is up to date with her immunizations. She requests to be admitted to the day hospital for pain management. She wants to start birth control, needing Depo Provera. Patient has No headache, No chest pain, No abdominal pain - No Nausea, No new weakness tingling or numbness, No Cough - SOB.  Systemic Review: General: The patient denies anorexia, fever, weight loss Cardiac: Denies chest pain, syncope, palpitations, pedal edema  Respiratory: Denies cough, shortness of breath, wheezing GI: Denies severe indigestion/heartburn, abdominal pain, nausea, vomiting, diarrhea and constipation GU: Denies hematuria, incontinence, dysuria  Musculoskeletal: Denies arthritis  Skin: Denies suspicious skin lesions Neurologic: Denies focal weakness or numbness, change in vision  Past Medical History:  Diagnosis Date  . Anemia   . Depression, major, recurrent (HCC)   . Migraines   . Sickle cell anemia (HCC)    Past Surgical History:  Procedure Laterality Date  . CESAREAN SECTION    . CHOLECYSTECTOMY  2000  . IR GENERIC HISTORICAL  10/08/2015   IR US GUIDE VASC ACCESS RIGHT 10/08/2015 Simonne Come, MD [facility]  . IR GENERIC HISTORICAL  10/08/2015   IR FLUORO GUIDE CV LINE RIGHT 10/08/2015 Simonne Come, MD [facility]  .  MULTIPLE TOOTH EXTRACTIONS N/A   . port a cath placement Right    about 6-7 years ago  . removal of porta cath Right 09/11/15  . TUBAL LIGATION      Allergies  Allergen Reactions  . Ultram [Tramadol] Other (See Comments)    seizures  . Zofran [Ondansetron Hcl] Nausea And Vomiting  . Buprenorphine Hcl Hives and Rash    Shaking Tolerates Percocet, Norco, and buprenorphine  . Morphine And Related Hives, Rash and Other (See Comments)    Shaking Tolerates Percocet, Norco, Dilaudid, and buprenorphine  . Tape Rash    Family History  Problem Relation Age of Onset  . Sickle cell trait Father   . Sickle cell trait Mother   . Sickle cell anemia Other     Prior to Admission medications   Medication Sig Start Date End Date Taking? Authorizing Provider  ARIPiprazole (ABILIFY) 10 MG tablet 1 tablet daily 02/13/16  Yes Historical Provider, MD  DULoxetine (CYMBALTA) 60 MG capsule TAKE 1 CAPSULE BY MOUTH DAILY. 11/27/15  Yes Quentin Angst, MD  folic acid (FOLVITE) 1 MG tablet Take 1 tablet (1 mg total) by mouth daily. 01/17/16  Yes Quentin Angst, MD  hydroxyurea (HYDREA) 500 MG capsule TAKE 2 CAPSULES BY MOUTH DAILY. MAY TAKE WITH FOOD TO MINIMIZE GI SIDE EFFECTS. Patient taking differently: Take 500 mg by mouth daily.  08/08/15  Yes [name], FNP  ibuprofen (ADVIL,MOTRIN) 200 MG tablet Take 400 mg by mouth every 6 (six) hours as needed for moderate pain.   Yes Historical  Provider, MD  morphine (MS CONTIN) 30 MG 12 hr tablet Take 1 tablet (30 mg total) by mouth every 12 (twelve) hours. 03/10/16  Yes Quentin Angst, MD  oxyCODONE-acetaminophen (PERCOCET) 10-325 MG tablet Take 1 tablet by mouth every 4 (four) hours as needed for pain. 03/10/16  Yes Quentin Angst, MD  Topiramate ER 100 MG CP24 Take 100 mg by mouth at bedtime.   Yes Historical Provider, MD   Physical Exam: Vitals:   03/10/16 1056  BP: 107/70  Pulse: 96  Resp: 18  Temp: 98.5 F (36.9 C)  TempSrc: Oral   SpO2: 99%  Weight: 126 lb (57.2 kg)  Height: 5' (1.524 m)   General: Alert, awake, afebrile, anicteric, not in obvious distress HEENT: Normocephalic and Atraumatic, Mucous membranes pink                PERRLA; EOM intact; No scleral icterus,                 Nares: Patent, Oropharynx: Clear, Fair Dentition                 Neck: FROM, no cervical lymphadenopathy, thyromegaly, carotid bruit or JVD;  CHEST WALL: No tenderness  CHEST: Normal respiration, clear to auscultation bilaterally  HEART: Regular rate and rhythm; no murmurs rubs or gallops  BACK: No kyphosis or scoliosis; no CVA tenderness  ABDOMEN: Positive Bowel Sounds, soft, non-tender; no masses, no organomegaly EXTREMITIES: No cyanosis, clubbing, or edema SKIN:  no rash or ulceration  CNS: Alert and Oriented x 4, Nonfocal exam, CN 2-12 intact  Labs on Admission:  Basic Metabolic Panel:  Recent Labs Lab 03/05/16 1849 03/06/16 1020 03/07/16 0138  NA 139 141 142  K 3.2* 3.3* 3.6  CL 108 113* 114*  CO2 26 25 24   GLUCOSE 82 92 114*  BUN 8 8 5*  CREATININE 0.34* 0.43* <0.30*  CALCIUM 8.8* 8.1* 8.4*   Liver Function Tests:  Recent Labs Lab 03/05/16 1849 03/07/16 0138  AST 47* 42*  ALT 24 25  ALKPHOS 94 87  BILITOT 1.9* 1.5*  PROT 7.6 6.5  ALBUMIN 4.0 3.5   No results for input(s): LIPASE, AMYLASE in the last 168 hours. No results for input(s): AMMONIA in the last 168 hours. CBC:  Recent Labs Lab 03/05/16 1849 03/06/16 1020 03/07/16 0138 03/08/16 0525  WBC 14.7* 12.7* 14.5* 11.9*  NEUTROABS 8.3*  --  8.9* 6.7  HGB 8.7* 7.6* 7.3* 7.1*  HCT 25.0* 21.5* 20.7* 20.2*  MCV 103.4* 103.1* 98.6 100.0  PLT 446* 373 382 332   Cardiac Enzymes:  Recent Labs Lab 03/05/16 1849  TROPONINI <0.03    BNP (last 3 results) No results for input(s): BNP in the last 8760 hours.  ProBNP (last 3 results) No results for input(s): PROBNP in the last 8760 hours.  CBG: No results for input(s): GLUCAP in the last 168  hours.   Assessment/Plan Active Problems:   Sickle cell anemia with crisis (HCC)   Admits to the Day Hospital  IVF D5 .45% Saline @ 125 mls/hour  Weight based Dilaudid PCA started within 30 minutes of admission  IV Toradol 30 mg Q 6 H  Monitor vitals very closely, Re-evaluate pain scale every hour  2 L of Oxygen by [place]  Patient will be re-evaluated for pain in the context of function and relationship to baseline as care progresses.  If no significant relieve from pain (remains above 5/10) will transfer patient to inpatient services for further  evaluation and management  Code Status: Full  Family Communication: None  DVT Prophylaxis: Ambulate as tolerated   Time spent: 35 Minutes  Ruba Outen, MD, MHA, FACP, FAAP, CPE  If 7PM-7AM, please contact night-coverage [email or web address] 03/10/2016, 11:04 AM

## 2016-03-10 NOTE — Progress Notes (Signed)
Patient ID: Brittany CampbellMiranda Bounds, female   DOB: 02/17/1986, 31 y.o.   MRN: [id number] Discharge instructions given to patient, PICC line flushed per order.  Patient states pain down to 5/10 on pain scale.  Patient is ambulatory at discharge.

## 2016-03-10 NOTE — Progress Notes (Signed)
Brittany Foster, is a 31 y.o. female  ZOX:[id number]  [id number]:[id number]  DOB - 09/27/1985  Chief Complaint  Patient presents with  . Follow-up       Subjective:   Brittany CampbellMiranda Foster is a 31 y.o. female with history of sickle cell disease here today for a hospital discharge follow up visit. Patient was admitted recently for pain crisis, discharged on 03/08/2016. She is here today complaining of 7/10 pain in her hips and lower back similar to her pain crisis. She denies any fever. No urinary symptom. She has not had pap smear in the recent years, last one probably 11 years ago per patient. She is up to date with her immunizations. She requests to be admitted to the day hospital for pain management. She wants to start birth control, needing Depo Provera. Patient has No headache, No chest pain, No abdominal pain - No Nausea, No new weakness tingling or numbness, No Cough - SOB.  Problem  Encounter for Contraceptive Management  Hb-SS disease with crisis (HCC)    ALLERGIES: Allergies  Allergen Reactions  . Ultram [Tramadol] Other (See Comments)    seizures  . Zofran [Ondansetron Hcl] Nausea And Vomiting  . Buprenorphine Hcl Hives and Rash    Shaking Tolerates Percocet, Norco, and buprenorphine  . Morphine And Related Hives, Rash and Other (See Comments)    Shaking Tolerates Percocet, Norco, Dilaudid, and buprenorphine  . Tape Rash    PAST MEDICAL HISTORY: Past Medical History:  Diagnosis Date  . Anemia   . Depression, major, recurrent (HCC)   . Migraines   . Sickle cell anemia (HCC)     MEDICATIONS AT HOME: Prior to Admission medications   Medication Sig Start Date End Date Taking? Authorizing Provider  ARIPiprazole (ABILIFY) 10 MG tablet 1 tablet daily 02/13/16  Yes Historical Provider, MD  DULoxetine (CYMBALTA) 60 MG capsule TAKE 1 CAPSULE BY MOUTH DAILY. 11/27/15  Yes Quentin Angstlugbemiga E Valory Wetherby, MD  folic acid (FOLVITE) 1 MG tablet Take 1 tablet (1 mg total) by mouth daily. 01/17/16  Yes  Quentin Angstlugbemiga E Hong Moring, MD  hydroxyurea (HYDREA) 500 MG capsule TAKE 2 CAPSULES BY MOUTH DAILY. MAY TAKE WITH FOOD TO MINIMIZE GI SIDE EFFECTS. Patient taking differently: Take 500 mg by mouth daily.  08/08/15  Yes Massie MaroonLachina M [name], FNP  ibuprofen (ADVIL,MOTRIN) 200 MG tablet Take 400 mg by mouth every 6 (six) hours as needed for moderate pain.   Yes Historical Provider, MD  morphine (MS CONTIN) 30 MG 12 hr tablet Take 1 tablet (30 mg total) by mouth every 12 (twelve) hours. 03/10/16  Yes Quentin Angstlugbemiga E Sigurd Pugh, MD  oxyCODONE-acetaminophen (PERCOCET) 10-325 MG tablet Take 1 tablet by mouth every 4 (four) hours as needed for pain. 03/10/16  Yes Quentin Angstlugbemiga E Tynika Luddy, MD  Topiramate ER 100 MG CP24 Take 100 mg by mouth at bedtime.   Yes Historical Provider, MD    Objective:   Vitals:   03/10/16 0944  BP: (!) 104/58  Pulse: 94  Resp: 18  Temp: 98.3 F (36.8 C)  TempSrc: Oral  SpO2: 100%  Weight: 126 lb (57.2 kg)  Height: 5' (1.524 m)   Exam General appearance : Awake, alert, not in any distress. Speech Clear. Not toxic looking HEENT: Atraumatic and Normocephalic, pupils equally reactive to light and accomodation Neck: Supple, no JVD. No cervical lymphadenopathy.  Chest: Good air entry bilaterally, no added sounds  CVS: S1 S2 regular, no murmurs.  Abdomen: Bowel sounds present, Non tender and not distended with  no gaurding, rigidity or rebound. Extremities: B/L Lower Ext shows no edema, both legs are warm to touch Neurology: Awake alert, and oriented X 3, CN II-XII intact, Non focal Skin: No Rash  Data Review No results found for: HGBA1C  Assessment & Plan   1. Encounter for contraceptive management, unspecified type  - medroxyPROGESTERone (DEPO-PROVERA) injection 150 mg; Inject 1 mL (150 mg total) into the muscle once.  2. Hb-SS disease with crisis [facility])  - Transfer to the day hospital for extended observation and pain management  - oxyCODONE-acetaminophen (PERCOCET) 10-325 MG tablet;  Take 1 tablet by mouth every 4 (four) hours as needed for pain.  Dispense: 90 tablet; Refill: 0 - morphine (MS CONTIN) 30 MG 12 hr tablet; Take 1 tablet (30 mg total) by mouth every 12 (twelve) hours.  Dispense: 60 tablet; Refill: 0  Continue Hydrea. We discussed the need for good hydration, monitoring of hydration status, avoidance of heat, cold, stress, and infection triggers. We discussed the risks and benefits of Hydrea, including bone marrow suppression, the possibility of GI upset, skin ulcers, hair thinning, and teratogenicity. The patient was reminded of the need to seek medical attention of any symptoms of bleeding, anemia, or infection. Continue folic acid 1 mg daily to prevent aplastic bone marrow crises.   Immunization status - Yearly influenza vaccination is recommended, as well as being up to date with Meningococcal and Pneumococcal vaccines.   3. Chronic pain syndrome  We agreed on Opiate dose and amount of pills  per month. We discussed that pt is to receive Schedule II prescriptions only from our clinic. Pt is also aware that the prescription history is available to [place] online through the [facility] CSRS. Controlled substance agreement reviewed and signed. We reminded Ezma that all patients receiving Schedule II narcotics must be seen for follow within one month of prescription being requested. We reviewed the terms of our pain agreement, including the need to keep medicines in a safe locked location away from children or pets, and the need to report excess sedation or constipation, measures to avoid constipation, and policies related to early refills and stolen prescriptions. According to the [place] [facility] program, we have reviewed details related to analgesia, adverse effects and aberrant behaviors.  Patient have been counseled extensively about nutrition and exercise.    Return in about 4 weeks (around 04/07/2016) for Sickle Cell Disease/Pain, Pap Smear.  The patient was given  clear instructions to go to ER or return to medical center if symptoms don't improve, worsen or new problems develop. The patient verbalized understanding. The patient was told to call to get lab results if they haven't heard anything in the next week.   This note has been created with Education officer, environmental. Any transcriptional errors are unintentional.    Jeanann Lewandowsky, MD, MHA, Maxwell Caul, CPE [facility] [place], [place] [phone]   03/10/2016, 10:32 AM

## 2016-03-10 NOTE — Discharge Instructions (Signed)
Sickle Cell Anemia, Adult °Sickle cell anemia is a condition where your red blood cells are shaped like sickles. Red blood cells carry oxygen through the body. Sickle-shaped red blood cells do not live as long as normal red blood cells. They also clump together and block blood from flowing through the blood vessels. These things prevent the body from getting enough oxygen. Sickle cell anemia causes organ damage and pain. It also increases the risk of infection. °Follow these instructions at home: °· Drink enough fluid to keep your pee (urine) clear or pale yellow. Drink more in hot weather and during exercise. °· Do not smoke. Smoking lowers oxygen levels in the blood. °· Only take over-the-counter or prescription medicines as told by your doctor. °· Take antibiotic medicines as told by your doctor. Make sure you finish them even if you start to feel better. °· Take supplements as told by your doctor. °· Consider wearing a medical alert bracelet. This tells anyone caring for you in an emergency of your condition. °· When traveling, keep your medical information, doctors' names, and the medicines you take with you at all times. °· If you have a fever, do not take fever medicines right away. This could cover up a problem. Tell your doctor. °· Keep all follow-up visits with your doctor. Sickle cell anemia requires regular medical care. °Contact a doctor if: °You have a fever. °Get help right away if: °· You feel dizzy or faint. °· You have new belly (abdominal) pain, especially on the left side near the stomach area. °· You have a lasting, often uncomfortable and painful erection of the penis (priapism). If it is not treated right away, you will become unable to have sex (impotence). °· You have numbness in your arms or legs or you have a hard time moving them. °· You have a hard time talking. °· You have a fever or lasting symptoms for more than 2-3 days. °· You have a fever and your symptoms suddenly get  worse. °· You have signs or symptoms of infection. These include: °? Chills. °? Being more tired than normal (lethargy). °? Irritability. °? Poor eating. °? Throwing up (vomiting). °· You have pain that is not helped with medicine. °· You have shortness of breath. °· You have pain in your chest. °· You are coughing up pus-like or bloody mucus. °· You have a stiff neck. °· Your feet or hands swell or have pain. °· Your belly looks bloated. °· Your joints hurt. °This information is not intended to replace advice given to you by your health care provider. Make sure you discuss any questions you have with your health care provider. °Document Released: 12/07/2012 Document Revised: 07/25/2015 Document Reviewed: 09/28/2012 °Elsevier Interactive Patient Education © 2017 [employer] ° °

## 2016-03-10 NOTE — Progress Notes (Signed)
Patient is here for FU  Patient complains of lower back and hip pain being present since yesterday. Pain is scaled currently at a 7.  Patient has taken medication today. Patient has not eaten today.  Patient is requesting to begin her DEPO's today. 02/14/16 PT was negative. Patient tolerated DEPO injection well today in the right dorso-gluteal.

## 2016-03-12 NOTE — Discharge Summary (Signed)
Physician Discharge Summary  Brittany CampbellMiranda Foster [id number] DOB: 08/05/1985 DOA: 03/10/2016  PCP: Brittany LewandowskyJEGEDE, Brittany Jacko, MD  Admit date: 03/10/2016  Discharge date: 03/10/2016  Time spent: 30 minutes  Discharge Diagnoses:  Active Problems:   Sickle cell anemia with crisis [facility](HCC)   Discharge Condition: Stable  Diet recommendation: Regular  Filed Weights   03/10/16 1056  Weight: 126 lb (57.2 kg)    History of present illness:  Brittany Foster is a 31 y.o. female with history of sickle cell disease was initially seen here today at the outpatient clinic for a hospital discharge follow up visit.Patient was admitted recently for pain crisis, discharged on 03/08/2016. She is here today complaining of 7/10 pain in her hips and lower back similar to her pain crisis. She denies any fever. No urinary symptom. She has not had pap smear in the recent years, last one probably 11 years ago per patient. She is up to date with her immunizations. She requests to be admitted to the day hospital for pain management. She wants to start birth control, needing Depo Provera.Patient has No headache, No chest pain, No abdominal pain - No Nausea, No new weakness tingling or numbness, No Cough - SOB.  Hospital Course:  Brittany Foster was admitted to the day hospital with sickle cell painful crisis. Patient was treated with weight based IV Dilaudid PCA, IV Toradol as well as IV fluids. Deon showed significant improvement symptomatically, pain improved from 7 to 2/10 at the time of discharge. Patient was discharged home in a hemodynamically stable condition. Brittany Foster will follow-up at the clinic as previously scheduled, continue with home medications as per prior to admission.  Discharge Instructions We discussed the need for good hydration, monitoring of hydration status, avoidance of heat, cold, stress, and infection triggers. We discussed the need to be compliant with taking Hydrea. Brittany Foster was reminded of the need to seek  medical attention of any symptoms of bleeding, anemia, or infection occurs.  Discharge Exam: Vitals:   03/10/16 1146 03/10/16 1300  BP: 105/67 113/67  Pulse: 85 91  Resp: 20 11  Temp: 98.6 F (37 C)     General appearance: alert, cooperative and no distress Eyes: conjunctivae/corneas clear. PERRL, EOM's intact. Fundi benign. Neck: no adenopathy, no carotid bruit, no JVD, supple, symmetrical, trachea midline and thyroid not enlarged, symmetric, no tenderness/mass/nodules Back: symmetric, no curvature. ROM normal. No CVA tenderness. Resp: clear to auscultation bilaterally Chest wall: no tenderness Cardio: regular rate and rhythm, S1, S2 normal, no murmur, click, rub or gallop GI: soft, non-tender; bowel sounds normal; no masses, no organomegaly Extremities: extremities normal, atraumatic, no cyanosis or edema Pulses: 2+ and symmetric Skin: Skin color, texture, turgor normal. No rashes or lesions Neurologic: Grossly normal   Discharge Medication List as of 03/10/2016  2:52 PM    CONTINUE these medications which have NOT CHANGED   Details  ARIPiprazole (ABILIFY) 10 MG tablet 1 tablet daily, Historical Med    DULoxetine (CYMBALTA) 60 MG capsule TAKE 1 CAPSULE BY MOUTH DAILY., Normal    folic acid (FOLVITE) 1 MG tablet Take 1 tablet (1 mg total) by mouth daily., Starting Fri 01/17/2016, Normal    hydroxyurea (HYDREA) 500 MG capsule TAKE 2 CAPSULES BY MOUTH DAILY. MAY TAKE WITH FOOD TO MINIMIZE GI SIDE EFFECTS., Normal    ibuprofen (ADVIL,MOTRIN) 200 MG tablet Take 400 mg by mouth every 6 (six) hours as needed for moderate pain., Historical Med    morphine (MS CONTIN) 30 MG 12 hr tablet Take 1  tablet (30 mg total) by mouth every 12 (twelve) hours., Starting Tue 03/10/2016, Print    oxyCODONE-acetaminophen (PERCOCET) 10-325 MG tablet Take 1 tablet by mouth every 4 (four) hours as needed for pain., Starting Tue 03/10/2016, Print    Topiramate ER 100 MG CP24 Take 100 mg by mouth at  bedtime., Historical Med       Allergies  Allergen Reactions  . Ultram [Tramadol] Other (See Comments)    seizures  . Zofran [Ondansetron Hcl] Nausea And Vomiting  . Buprenorphine Hcl Hives and Rash    Shaking Tolerates Percocet, Norco, and buprenorphine  . Morphine And Related Hives, Rash and Other (See Comments)    Shaking Tolerates Percocet, Norco, Dilaudid, and buprenorphine  . Tape Rash   Follow-up Information    Brittany Lewandowsky, MD Follow up.   Specialty:  Internal Medicine Why:  As needed Contact information: [address] Pall Brittany [place] [address] [phone]           Significant Diagnostic Studies: Dg Chest 2 View  Result Date: 03/07/2016 CLINICAL DATA:  Sickle cell pain crisis.  Shortness of breath EXAM: CHEST  2 VIEW COMPARISON:  Two days ago FINDINGS: New blunting of the right costophrenic sulcus. No consolidating airspace disease. Mild linear opacity overlapping the posterior heart on the lateral projection is likely atelectasis. Chronic interstitial coarsening is stable. Prominent formed stool in the upper abdomen. Normal heart size and mediastinal contours. Central line on the right with tip at the upper right atrium. IMPRESSION: Trace right pleural fluid that is new from 2 days ago. No consolidating airspace disease. Electronically Signed   By: Marnee Spring M.D.   On: 03/07/2016 09:06   Dg Chest 2 View  Result Date: 03/05/2016 CLINICAL DATA:  31 year old female with chest pain. Hip and leg pain. Sickle cell crisis. Initial encounter. EXAM: CHEST  2 VIEW COMPARISON:  01/22/2016 chest radiographs and earlier. FINDINGS: Tunneled type right chest central catheter appears stable. Stable lung volumes, at the lower limits of normal. Stable mild elevation of the left hemidiaphragm. Mild cardiomegaly. Other mediastinal contours are within normal limits. Visualized tracheal air column is within normal limits. Stable pulmonary vascularity without acute edema. No  pneumothorax, pleural effusion or confluent pulmonary opacity. Visualized osseous structures appear fairly unremarkable. Negative visible bowel gas pattern. IMPRESSION: No acute cardiopulmonary abnormality. Electronically Signed   By: Odessa Fleming M.D.   On: 03/05/2016 19:15    Signed:  Jeanann Lewandowsky MD, MHA, FACP, FAAP, CPE   03/12/2016, 10:42 AM

## 2016-03-13 ENCOUNTER — Inpatient Hospital Stay (HOSPITAL_COMMUNITY)
Admission: EM | Admit: 2016-03-13 | Discharge: 2016-03-16 | DRG: 812 | Disposition: A | Discharge status: Home / Self Care | Payer: Medicaid Other | Attending: Internal Medicine | Admitting: Internal Medicine

## 2016-03-13 ENCOUNTER — Emergency Department (HOSPITAL_COMMUNITY): Payer: Medicaid Other

## 2016-03-13 ENCOUNTER — Encounter (HOSPITAL_COMMUNITY): Payer: Self-pay | Admitting: Emergency Medicine

## 2016-03-13 DIAGNOSIS — Z87891 Personal history of nicotine dependence: Secondary | ICD-10-CM

## 2016-03-13 DIAGNOSIS — Z79899 Other long term (current) drug therapy: Secondary | ICD-10-CM

## 2016-03-13 DIAGNOSIS — R079 Chest pain, unspecified: Secondary | ICD-10-CM | POA: Diagnosis present

## 2016-03-13 DIAGNOSIS — E876 Hypokalemia: Secondary | ICD-10-CM | POA: Diagnosis present

## 2016-03-13 DIAGNOSIS — Z9049 Acquired absence of other specified parts of digestive tract: Secondary | ICD-10-CM

## 2016-03-13 DIAGNOSIS — F1120 Opioid dependence, uncomplicated: Secondary | ICD-10-CM | POA: Diagnosis present

## 2016-03-13 DIAGNOSIS — D72829 Elevated white blood cell count, unspecified: Secondary | ICD-10-CM | POA: Diagnosis present

## 2016-03-13 DIAGNOSIS — D5700 Hb-SS disease with crisis, unspecified: Principal | ICD-10-CM | POA: Diagnosis present

## 2016-03-13 DIAGNOSIS — G894 Chronic pain syndrome: Secondary | ICD-10-CM | POA: Diagnosis present

## 2016-03-13 LAB — CBC WITH DIFFERENTIAL/PLATELET
BASOS PCT: 0 %
Basophils Absolute: 0 10*3/uL (ref 0.0–0.1)
EOS ABS: 0 10*3/uL (ref 0.0–0.7)
EOS PCT: 0 %
HEMATOCRIT: 21.8 % — AB (ref 36.0–46.0)
HEMOGLOBIN: 7.6 g/dL — AB (ref 12.0–15.0)
LYMPHS PCT: 32 %
Lymphs Abs: 4.8 10*3/uL — ABNORMAL HIGH (ref 0.7–4.0)
MCH: 34.4 pg — AB (ref 26.0–34.0)
MCHC: 34.9 g/dL (ref 30.0–36.0)
MCV: 98.6 fL (ref 78.0–100.0)
MONOS PCT: 7 %
Monocytes Absolute: 1 10*3/uL (ref 0.1–1.0)
NRBC: 2 /100{WBCs} — AB
Neutro Abs: 9.1 10*3/uL — ABNORMAL HIGH (ref 1.7–7.7)
Neutrophils Relative %: 61 %
Platelets: 424 10*3/uL — ABNORMAL HIGH (ref 150–400)
RBC: 2.21 MIL/uL — ABNORMAL LOW (ref 3.87–5.11)
RDW: 25.3 % — ABNORMAL HIGH (ref 11.5–15.5)
WBC: 14.9 10*3/uL — ABNORMAL HIGH (ref 4.0–10.5)

## 2016-03-13 LAB — RETICULOCYTES: RBC.: 2.21 MIL/uL — ABNORMAL LOW (ref 3.87–5.11)

## 2016-03-13 LAB — COMPREHENSIVE METABOLIC PANEL
ALBUMIN: 3.9 g/dL (ref 3.5–5.0)
ALT: 22 U/L (ref 14–54)
ANION GAP: 6 (ref 5–15)
AST: 41 U/L (ref 15–41)
Alkaline Phosphatase: 95 U/L (ref 38–126)
BUN: 8 mg/dL (ref 6–20)
CHLORIDE: 111 mmol/L (ref 101–111)
CO2: 22 mmol/L (ref 22–32)
Calcium: 8.8 mg/dL — ABNORMAL LOW (ref 8.9–10.3)
Creatinine, Ser: 0.39 mg/dL — ABNORMAL LOW (ref 0.44–1.00)
GFR calc Af Amer: >60 mL/min (ref >60)
GFR calc non Af Amer: >60 mL/min (ref >60)
GLUCOSE: 116 mg/dL — AB (ref 65–99)
POTASSIUM: 2.9 mmol/L — AB (ref 3.5–5.1)
SODIUM: 139 mmol/L (ref 135–145)
TOTAL PROTEIN: 6.7 g/dL (ref 6.5–8.1)
Total Bilirubin: 1.6 mg/dL — ABNORMAL HIGH (ref 0.3–1.2)

## 2016-03-13 LAB — I-STAT TROPONIN, ED: TROPONIN I, POC: 0 ng/mL (ref 0.00–0.08)

## 2016-03-13 MED ORDER — HYDROMORPHONE HCL 2 MG PO TABS
4.0000 mg | ORAL_TABLET | Freq: Once | ORAL | Status: AC
  Administered 2016-03-14: 4 mg via ORAL
  Filled 2016-03-13: qty 2

## 2016-03-13 MED ORDER — PROMETHAZINE HCL 25 MG/ML IJ SOLN
25.0000 mg | Freq: Once | INTRAMUSCULAR | Status: AC
  Administered 2016-03-14: 25 mg via INTRAVENOUS
  Filled 2016-03-13: qty 1

## 2016-03-13 MED ORDER — DEXTROSE-NACL 5-0.45 % IV SOLN
INTRAVENOUS | Status: DC
  Administered 2016-03-14 – 2016-03-15 (×5): via INTRAVENOUS

## 2016-03-13 MED ORDER — KETOROLAC TROMETHAMINE 30 MG/ML IJ SOLN
30.0000 mg | INTRAMUSCULAR | Status: AC
  Administered 2016-03-14: 30 mg via INTRAVENOUS
  Filled 2016-03-13: qty 1

## 2016-03-13 MED ORDER — MORPHINE SULFATE ER 30 MG PO TBCR
30.0000 mg | EXTENDED_RELEASE_TABLET | Freq: Two times a day (BID) | ORAL | Status: DC
  Administered 2016-03-14 – 2016-03-16 (×6): 30 mg via ORAL
  Filled 2016-03-13: qty 1
  Filled 2016-03-13: qty 2
  Filled 2016-03-13: qty 1
  Filled 2016-03-13: qty 2
  Filled 2016-03-13 (×2): qty 1

## 2016-03-13 MED ORDER — DIPHENHYDRAMINE HCL 50 MG/ML IJ SOLN
25.0000 mg | Freq: Once | INTRAMUSCULAR | Status: AC
  Administered 2016-03-14: 25 mg via INTRAVENOUS
  Filled 2016-03-13: qty 1

## 2016-03-13 MED ORDER — POTASSIUM CHLORIDE CRYS ER 20 MEQ PO TBCR
40.0000 meq | EXTENDED_RELEASE_TABLET | Freq: Once | ORAL | Status: DC
  Filled 2016-03-13: qty 2

## 2016-03-13 NOTE — ED Provider Notes (Signed)
[facility]-EMERGENCY DEPT Provider Note   CSN: [id number] Arrival date & time: 03/13/16  1817 By signing my name below, I, Brittany Foster, attest that this documentation has been prepared under the direction and in the presence of non-physician practitioner, Shanna Cisco, PA-C. Electronically Signed: [name], Scribe. 03/14/2016. 5:17 AM.   History   Chief Complaint Chief Complaint  Patient presents with  . Sickle Cell Pain Crisis    HPI Brittany Foster is a 31 y.o. female who presents to the Emergency Department complaining of constant, progressively worsening sickle cell crisis pain to her bilateral legs, hips, and chest onset this morning. She has taken oxycodone, last taken at 8 PM, and applied heat with no relief. She states this pain is typical for her crises, but reports that she typically does not have chest pain. Per chart review, pt frequently reports sickle cell crisis pain to her chest. She describes her chest pain as pressure. She notes associated nausea. She reports her last crisis was 2 weeks ago. Pt denies any fever, cough, congestion, shortness of breath or any other associated symptoms at this time.    The history is provided by the patient and medical records. No language interpreter was used.   Past Medical History:  Diagnosis Date  . Anemia   . Depression, major, recurrent (HCC)   . Migraines   . Sickle cell anemia [facility])     Patient Active Problem List   Diagnosis Date Noted  . Encounter for contraceptive management 03/10/2016  . Sickle cell crisis (HCC) 03/06/2016  . Sickle cell pain crisis (HCC) 02/14/2016  . Sickle cell anemia with pain (HCC) 02/14/2016  . Migraine without status migrainosus, not intractable   . Sickle cell anemia with crisis (HCC) 01/30/2016  . Hypotension 01/26/2016  . Opiate dependence (HCC) 01/23/2016  . MDD (major depressive disorder), recurrent severe, without psychosis (HCC) 01/10/2016  . Chest pain 03/07/2015  . Vitamin D deficiency  01/08/2015  . Chronic pain syndrome 01/08/2015  . 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 Hb-SS disease with crisis (HCC) 04/24/2014  . Anemia 02/09/2014  . Neuropathy (HCC) 02/09/2014  . Chronic headache disorder 02/09/2014    Past Surgical History:  Procedure Laterality Date  . CESAREAN SECTION    . CHOLECYSTECTOMY  2000  . IR GENERIC HISTORICAL  10/08/2015   IR US GUIDE VASC ACCESS RIGHT 10/08/2015 Simonne Come, MD [facility]INTERV RAD  . IR GENERIC HISTORICAL  10/08/2015   IR FLUORO GUIDE CV LINE RIGHT 10/08/2015 Simonne Come, MD [facility]INTERV RAD  . MULTIPLE TOOTH EXTRACTIONS N/A   . port a cath placement Right    about 6-7 years ago  . removal of porta cath Right 09/11/15  . TUBAL LIGATION      OB History    Gravida Para Term Preterm AB Living   1         1   SAB TAB Ectopic Multiple Live Births           1       Home Medications    Prior to Admission medications   Medication Sig Start Date End Date Taking? Authorizing Provider  ARIPiprazole (ABILIFY) 10 MG tablet 1 tablet daily 02/13/16  Yes Historical Provider, MD  DULoxetine (CYMBALTA) 60 MG capsule TAKE 1 CAPSULE BY MOUTH DAILY. 11/27/15  Yes Quentin Angst, MD  folic acid (FOLVITE) 1 MG tablet Take 1 tablet (1 mg total) by mouth daily. 01/17/16  Yes Quentin Angst, MD  hydroxyurea (HYDREA) 500  MG capsule TAKE 2 CAPSULES BY MOUTH DAILY. MAY TAKE WITH FOOD TO MINIMIZE GI SIDE EFFECTS. Patient taking differently: Take 500 mg by mouth daily.  08/08/15  Yes Massie MaroonLachina M [name], FNP  ibuprofen (ADVIL,MOTRIN) 200 MG tablet Take 400 mg by mouth every 6 (six) hours as needed for moderate pain.   Yes Historical Provider, MD  morphine (MS CONTIN) 30 MG 12 hr tablet Take 1 tablet (30 mg total) by mouth every 12 (twelve) hours. 03/10/16  Yes Quentin Angstlugbemiga E Jegede, MD  oxyCODONE-acetaminophen (PERCOCET) 10-325 MG tablet Take 1 tablet by mouth every 4 (four) hours as needed for pain. 03/10/16  Yes Quentin Angstlugbemiga E Jegede, MD  Topiramate ER 100  MG CP24 Take 100 mg by mouth at bedtime.   Yes Historical Provider, MD    Family History Family History  Problem Relation Age of Onset  . Sickle cell trait Father   . Sickle cell trait Mother   . Sickle cell anemia Other     Social History Social History  Substance Use Topics  . Smoking status: Former Games developermoker  . Smokeless tobacco: Never Used  . Alcohol use No    Allergies   Ultram [tramadol]; Zofran [ondansetron hcl]; Buprenorphine hcl; Morphine and related; and Tape   Review of Systems Review of Systems  Constitutional: Negative for chills and fever.  HENT: Negative for congestion.   Eyes: Negative for visual disturbance.  Respiratory: Negative for cough and shortness of breath.   Cardiovascular: Positive for chest pain. Negative for palpitations and leg swelling.  Gastrointestinal: Negative for abdominal pain, nausea and vomiting.  Genitourinary: Negative for dysuria.  Musculoskeletal: Positive for arthralgias and myalgias.  Skin: Negative for rash.  Neurological: Negative for headaches.      Physical Exam Updated Vital Signs BP 103/68 (BP Location: Right Arm)   Pulse 86   Temp 98.9 F (37.2 C) (Oral)   Resp 21   Wt 54.9 kg   SpO2 98%   BMI 23.63 kg/m   Physical Exam  Constitutional: She is oriented to person, place, and time. She appears well-developed and well-nourished. No distress.  Non-toxic appearing  HENT:  Head: Normocephalic and atraumatic.  Cardiovascular: Normal rate, regular rhythm, normal heart sounds and intact distal pulses.   No murmur heard. Pulmonary/Chest: Effort normal and breath sounds normal. No respiratory distress. She has no wheezes. She has no rales. She exhibits no tenderness.  Speaking in full sentences with no difficulty.   Abdominal: Soft. She exhibits no distension. There is no tenderness.  Musculoskeletal: She exhibits no edema.  Lower extremities with diffuse tenderness to palpation. FROM. 5/5 muscle strength of all four  extremities.   Neurological: She is alert and oriented to person, place, and time.  Skin: Skin is warm and dry.  Nursing note and vitals reviewed.   ED Treatments / Results  DIAGNOSTIC STUDIES:  Oxygen Saturation is 93% on RA, low by my interpretation.    COORDINATION OF CARE:  10:48 PM Discussed treatment plan with pt at bedside and pt agreed to plan.   Labs (all labs ordered are listed, but only abnormal results are displayed) Labs Reviewed  COMPREHENSIVE METABOLIC PANEL - Abnormal; Notable for the following:       Result Value   Potassium 2.9 (*)    Glucose, Bld 116 (*)    Creatinine, Ser 0.39 (*)    Calcium 8.8 (*)    Total Bilirubin 1.6 (*)    All other components within normal limits  CBC WITH DIFFERENTIAL/PLATELET -  Abnormal; Notable for the following:    WBC 14.9 (*)    RBC 2.21 (*)    Hemoglobin 7.6 (*)    HCT 21.8 (*)    MCH 34.4 (*)    RDW 25.3 (*)    Platelets 424 (*)    nRBC 2 (*)    Neutro Abs 9.1 (*)    Lymphs Abs 4.8 (*)    All other components within normal limits  RETICULOCYTES - Abnormal; Notable for the following:    Retic Ct Pct >23.0 (*)    RBC. 2.21 (*)    All other components within normal limits  TROPONIN I  TROPONIN I  I-STAT TROPOININ, ED    EKG  EKG Interpretation  Date/Time:  Friday March 13 2016 23:20:30 EST Ventricular Rate:  96 PR Interval:    QRS Duration: 89 QT Interval:  377 QTC Calculation: 477 R Axis:   8 Text Interpretation:  Sinus rhythm Borderline prolonged QT interval Confirmed by Jodi Mourning MD, JOSHUA [id number]) on 03/14/2016 3:27:08 AM       Radiology Dg Chest 2 View  Result Date: 03/13/2016 CLINICAL DATA:  Sickle cell crisis pain EXAM: CHEST  2 VIEW COMPARISON:  03/07/2016 FINDINGS: A right-sided central venous catheter tip overlies the SVC. There is no acute consolidation or effusion. Stable borderline enlarged heart. No overt failure. No pneumothorax. Surgical clips in the right upper quadrant. IMPRESSION: No  acute infiltrate or edema. Electronically Signed   By: Jasmine Pang M.D.   On: 03/13/2016 23:14    Procedures Procedures (including critical care time)  Medications Ordered in ED Medications  dextrose 5 %-0.45 % sodium chloride infusion ( Intravenous New Bag/Given 03/14/16 0116)  morphine (MS CONTIN) 12 hr tablet 30 mg (30 mg Oral Given 03/14/16 0014)  potassium chloride SA (K-DUR,KLOR-CON) CR tablet 40 mEq (40 mEq Oral Not Given 03/14/16 0000)  ARIPiprazole (ABILIFY) tablet 10 mg (not administered)  folic acid (FOLVITE) tablet 1 mg (not administered)  DULoxetine (CYMBALTA) DR capsule 60 mg (not administered)  hydroxyurea (HYDREA) capsule 500 mg (not administered)  Topiramate ER CP24 100 mg (not administered)  senna-docusate (Senokot-S) tablet 1 tablet (not administered)  polyethylene glycol (MIRALAX / GLYCOLAX) packet 17 g (not administered)  naloxone (NARCAN) injection 0.4 mg (not administered)    And  sodium chloride flush (NS) 0.9 % injection 9 mL (not administered)  diphenhydrAMINE (BENADRYL) capsule 25-50 mg (not administered)    Or  diphenhydrAMINE (BENADRYL) 25 mg in sodium chloride 0.9 % 50 mL IVPB (not administered)  ketorolac (TORADOL) 15 MG/ML injection 15 mg (not administered)  HYDROmorphone (DILAUDID) 1 mg/mL PCA injection (not administered)  pantoprazole (PROTONIX) EC tablet 40 mg (not administered)  alum & mag hydroxide-simeth (MAALOX/MYLANTA) 200-200-20 MG/5ML suspension 30 mL (not administered)  potassium chloride SA (K-DUR,KLOR-CON) CR tablet 40 mEq (not administered)  ketorolac (TORADOL) 30 MG/ML injection 30 mg (30 mg Intravenous Given 03/14/16 0005)  HYDROmorphone (DILAUDID) tablet 4 mg (4 mg Oral Given 03/14/16 0001)  promethazine (PHENERGAN) injection 25 mg (25 mg Intravenous Given 03/14/16 0111)  diphenhydrAMINE (BENADRYL) injection 25 mg (25 mg Intravenous Given 03/14/16 0002)  HYDROmorphone (DILAUDID) injection 1 mg (1 mg Intravenous Given 03/14/16 0119)    HYDROmorphone (DILAUDID) injection 1 mg (1 mg Intravenous Given 03/14/16 0201)  HYDROmorphone (DILAUDID) injection 1 mg (1 mg Intravenous Given 03/14/16 0438)     Initial Impression / Assessment and Plan / ED Course  I have reviewed the triage vital signs and the nursing notes.  Pertinent labs &  imaging results that were available during my care of the patient were reviewed by me and considered in my medical decision making (see chart for details).  Clinical Course    MDM Number of Diagnoses or Management Options Sickle cell crisis [facility]):  Sickle cell pain crisis [facility]):   Brandyce Dimario is a 31 y.o. female who presents to ED for Pain consistent with sickle cell crisis. Patient also endorses chest pain which she states is not consistent with her usual crises. She states that she has never had chest pain with crises before, but per chart review she has endorsed chest pain several times in the past. EKG reassuring. Troponin negative. Chest x-ray negative. CMP with potassium of 2.9 which was replenished. Hemoglobin baseline. Care plan in place and reviewed. After 3 rounds of pain medication, patient still endorses significant amount of pain and requesting admission. Hospitalist consulted who will admit.   Final Clinical Impressions(s) / ED Diagnoses   Final diagnoses:  Sickle cell crisis (HCC)  Sickle cell pain crisis (HCC)    New Prescriptions New Prescriptions   No medications on file   I personally performed the services described in this documentation, which was scribed in my presence. The recorded information has been reviewed and is accurate.      [facility] Ward, PA-C 03/14/16 1610    Blane Ohara, MD 03/14/16 [phone]

## 2016-03-13 NOTE — ED Notes (Signed)
Unable to collect labs per patient she wants them to collect it from her picc line .

## 2016-03-13 NOTE — ED Triage Notes (Signed)
Pt complaint of generalized pain related to SCC onset 6 hours ago unrelieved by home medication regimen.

## 2016-03-14 ENCOUNTER — Encounter (HOSPITAL_COMMUNITY): Payer: Self-pay

## 2016-03-14 DIAGNOSIS — Z87891 Personal history of nicotine dependence: Secondary | ICD-10-CM | POA: Diagnosis not present

## 2016-03-14 DIAGNOSIS — R079 Chest pain, unspecified: Secondary | ICD-10-CM | POA: Diagnosis not present

## 2016-03-14 DIAGNOSIS — F1190 Opioid use, unspecified, uncomplicated: Secondary | ICD-10-CM | POA: Diagnosis not present

## 2016-03-14 DIAGNOSIS — Z79891 Long term (current) use of opiate analgesic: Secondary | ICD-10-CM | POA: Diagnosis not present

## 2016-03-14 DIAGNOSIS — G894 Chronic pain syndrome: Secondary | ICD-10-CM | POA: Diagnosis present

## 2016-03-14 DIAGNOSIS — F339 Major depressive disorder, recurrent, unspecified: Secondary | ICD-10-CM | POA: Diagnosis not present

## 2016-03-14 DIAGNOSIS — D72829 Elevated white blood cell count, unspecified: Secondary | ICD-10-CM | POA: Diagnosis present

## 2016-03-14 DIAGNOSIS — F1120 Opioid dependence, uncomplicated: Secondary | ICD-10-CM | POA: Diagnosis present

## 2016-03-14 DIAGNOSIS — Z79899 Other long term (current) drug therapy: Secondary | ICD-10-CM | POA: Diagnosis not present

## 2016-03-14 DIAGNOSIS — Z9049 Acquired absence of other specified parts of digestive tract: Secondary | ICD-10-CM | POA: Diagnosis not present

## 2016-03-14 DIAGNOSIS — E876 Hypokalemia: Secondary | ICD-10-CM | POA: Diagnosis present

## 2016-03-14 DIAGNOSIS — D5700 Hb-SS disease with crisis, unspecified: Secondary | ICD-10-CM | POA: Diagnosis present

## 2016-03-14 LAB — BASIC METABOLIC PANEL
Anion gap: 5 (ref 5–15)
BUN: 5 mg/dL — AB (ref 6–20)
CO2: 20 mmol/L — AB (ref 22–32)
CREATININE: 0.38 mg/dL — AB (ref 0.44–1.00)
Calcium: 8.4 mg/dL — ABNORMAL LOW (ref 8.9–10.3)
Chloride: 113 mmol/L — ABNORMAL HIGH (ref 101–111)
GFR calc Af Amer: >60 mL/min (ref >60)
GFR calc non Af Amer: >60 mL/min (ref >60)
GLUCOSE: 95 mg/dL (ref 65–99)
Potassium: 3.2 mmol/L — ABNORMAL LOW (ref 3.5–5.1)
Sodium: 138 mmol/L (ref 135–145)

## 2016-03-14 LAB — TROPONIN I

## 2016-03-14 LAB — MAGNESIUM: Magnesium: 1.9 mg/dL (ref 1.7–2.4)

## 2016-03-14 MED ORDER — NALOXONE HCL 0.4 MG/ML IJ SOLN: 0.4000 mg | INTRAMUSCULAR | Status: DC | PRN

## 2016-03-14 MED ORDER — POLYETHYLENE GLYCOL 3350 17 G PO PACK: 17.0000 g | PACK | Freq: Every day | ORAL | Status: DC | PRN

## 2016-03-14 MED ORDER — SENNOSIDES-DOCUSATE SODIUM 8.6-50 MG PO TABS
1.0000 | ORAL_TABLET | Freq: Two times a day (BID) | ORAL | Status: DC
  Filled 2016-03-14 (×4): qty 1

## 2016-03-14 MED ORDER — HYDROMORPHONE HCL 1 MG/ML IJ SOLN
1.0000 mg | INTRAMUSCULAR | Status: DC | PRN
  Administered 2016-03-14 (×3): 1 mg via INTRAVENOUS
  Filled 2016-03-14 (×3): qty 1

## 2016-03-14 MED ORDER — DULOXETINE HCL 60 MG PO CPEP
60.0000 mg | ORAL_CAPSULE | Freq: Every day | ORAL | Status: DC
  Administered 2016-03-14 – 2016-03-16 (×3): 60 mg via ORAL
  Filled 2016-03-14 (×2): qty 1
  Filled 2016-03-14: qty 2

## 2016-03-14 MED ORDER — KETOROLAC TROMETHAMINE 15 MG/ML IJ SOLN
15.0000 mg | Freq: Four times a day (QID) | INTRAMUSCULAR | Status: DC
  Administered 2016-03-14: 15 mg via INTRAVENOUS
  Filled 2016-03-14: qty 1

## 2016-03-14 MED ORDER — HYDROXYUREA 500 MG PO CAPS
500.0000 mg | ORAL_CAPSULE | Freq: Every day | ORAL | Status: DC
  Administered 2016-03-15 – 2016-03-16 (×2): 500 mg via ORAL
  Filled 2016-03-14 (×2): qty 1

## 2016-03-14 MED ORDER — TOPIRAMATE 100 MG PO TABS
100.0000 mg | ORAL_TABLET | Freq: Every day | ORAL | Status: DC
  Administered 2016-03-14 – 2016-03-15 (×2): 100 mg via ORAL
  Filled 2016-03-14 (×2): qty 1

## 2016-03-14 MED ORDER — DIPHENHYDRAMINE HCL 50 MG/ML IJ SOLN
25.0000 mg | INTRAMUSCULAR | Status: DC | PRN
  Filled 2016-03-14: qty 0.5

## 2016-03-14 MED ORDER — DIPHENHYDRAMINE HCL 25 MG PO CAPS
25.0000 mg | ORAL_CAPSULE | ORAL | Status: DC | PRN
Start: 2016-03-14 — End: 2016-03-16
  Administered 2016-03-15 – 2016-03-16 (×2): 50 mg via ORAL
  Filled 2016-03-14 (×2): qty 2

## 2016-03-14 MED ORDER — HYDROMORPHONE 1 MG/ML IV SOLN
INTRAVENOUS | Status: DC
  Administered 2016-03-14: 7.2 mg via INTRAVENOUS
  Administered 2016-03-14: 8 mg via INTRAVENOUS
  Administered 2016-03-14: 7 mg via INTRAVENOUS
  Administered 2016-03-15: via INTRAVENOUS
  Administered 2016-03-15: 4.2 mg via INTRAVENOUS
  Administered 2016-03-15: 4.8 mg via INTRAVENOUS
  Administered 2016-03-15: 1 mg via INTRAVENOUS
  Administered 2016-03-15: 6.6 mg via INTRAVENOUS
  Administered 2016-03-15: 17:00:00 via INTRAVENOUS
  Administered 2016-03-15: 9 mg via INTRAVENOUS
  Filled 2016-03-14 (×3): qty 25

## 2016-03-14 MED ORDER — SODIUM CHLORIDE 0.9% FLUSH
10.0000 mL | INTRAVENOUS | Status: DC | PRN
  Administered 2016-03-16: 10 mL
  Filled 2016-03-14: qty 40

## 2016-03-14 MED ORDER — OXYCODONE-ACETAMINOPHEN 5-325 MG PO TABS
1.0000 | ORAL_TABLET | ORAL | Status: DC | PRN
  Administered 2016-03-14 – 2016-03-15 (×4): 1 via ORAL
  Filled 2016-03-14 (×5): qty 1

## 2016-03-14 MED ORDER — TOPIRAMATE ER 100 MG PO CAP24: 100.0000 mg | ORAL_CAPSULE | Freq: Every day | ORAL | Status: DC

## 2016-03-14 MED ORDER — FOLIC ACID 1 MG PO TABS
1.0000 mg | ORAL_TABLET | Freq: Every day | ORAL | Status: DC
  Administered 2016-03-14 – 2016-03-16 (×3): 1 mg via ORAL
  Filled 2016-03-14 (×3): qty 1

## 2016-03-14 MED ORDER — HYDROMORPHONE HCL 1 MG/ML IJ SOLN
1.0000 mg | Freq: Once | INTRAMUSCULAR | Status: AC
  Administered 2016-03-14: 1 mg via INTRAVENOUS
  Filled 2016-03-14: qty 1

## 2016-03-14 MED ORDER — SODIUM CHLORIDE 0.9% FLUSH: 9.0000 mL | INTRAVENOUS | Status: DC | PRN

## 2016-03-14 MED ORDER — HYDROMORPHONE 1 MG/ML IV SOLN
INTRAVENOUS | Status: DC
  Administered 2016-03-14: 13:00:00 via INTRAVENOUS
  Filled 2016-03-14: qty 25

## 2016-03-14 MED ORDER — ALUM & MAG HYDROXIDE-SIMETH 200-200-20 MG/5ML PO SUSP: 30.0000 mL | Freq: Four times a day (QID) | ORAL | Status: DC | PRN

## 2016-03-14 MED ORDER — ARIPIPRAZOLE 10 MG PO TABS
10.0000 mg | ORAL_TABLET | Freq: Every day | ORAL | Status: DC
  Administered 2016-03-14 – 2016-03-16 (×3): 10 mg via ORAL
  Filled 2016-03-14 (×3): qty 1

## 2016-03-14 MED ORDER — POTASSIUM CHLORIDE CRYS ER 20 MEQ PO TBCR
40.0000 meq | EXTENDED_RELEASE_TABLET | Freq: Once | ORAL | Status: DC
  Filled 2016-03-14: qty 2

## 2016-03-14 MED ORDER — POTASSIUM CHLORIDE CRYS ER 20 MEQ PO TBCR
40.0000 meq | EXTENDED_RELEASE_TABLET | ORAL | Status: DC
  Administered 2016-03-14: 40 meq via ORAL

## 2016-03-14 MED ORDER — KETOROLAC TROMETHAMINE 15 MG/ML IJ SOLN
30.0000 mg | Freq: Four times a day (QID) | INTRAMUSCULAR | Status: DC
  Administered 2016-03-14 – 2016-03-16 (×8): 30 mg via INTRAVENOUS
  Filled 2016-03-14 (×8): qty 2

## 2016-03-14 MED ORDER — OXYCODONE HCL 5 MG PO TABS
5.0000 mg | ORAL_TABLET | ORAL | Status: DC | PRN
  Administered 2016-03-14 – 2016-03-15 (×4): 5 mg via ORAL
  Filled 2016-03-14 (×6): qty 1

## 2016-03-14 MED ORDER — PROCHLORPERAZINE MALEATE 10 MG PO TABS
10.0000 mg | ORAL_TABLET | Freq: Four times a day (QID) | ORAL | Status: DC | PRN
  Administered 2016-03-14 – 2016-03-15 (×2): 10 mg via ORAL
  Filled 2016-03-14 (×2): qty 1

## 2016-03-14 MED ORDER — PANTOPRAZOLE SODIUM 40 MG PO TBEC
40.0000 mg | DELAYED_RELEASE_TABLET | Freq: Every day | ORAL | Status: DC
  Administered 2016-03-15: 40 mg via ORAL
  Filled 2016-03-14 (×3): qty 1

## 2016-03-14 NOTE — Progress Notes (Signed)
I assumed care of this patient at 1500.  I agree with the previous nurses assessment.  

## 2016-03-14 NOTE — ED Notes (Addendum)
Awaiting hydrea from pharmacy; pt aware.

## 2016-03-14 NOTE — ED Notes (Signed)
Spoke with Ashley RoyaltyMatthews regarding pt request to see her. [name] verbalizes while be in to see pt shortly but strongly encourages pt to take treatment regimen in the mean time. Pt agrees to take treatment regimen including potassium minus senokot and protonix stating "I don't need those and I'm not taking them."

## 2016-03-14 NOTE — H&P (Signed)
History and Physical    Brittany Foster GEX:[id number] DOB: January 16, 1986 DOA: 03/13/2016  PCP: Jeanann Lewandowsky, MD   Patient coming from: Home  Chief Complaint: Chest, back, hip, leg pain  HPI: Brittany Foster is a 31 y.o. woman with a history of sickle cell disease, depression, and migraine headaches who has frequent presentations for inpatient management of sickle cell crisis.  Tonight, she is reporting that she is has bilateral hip, leg, and back pain, which are typical for her pain crises.  She is also complaining of 9 out of 10 sharp substernal chest pain that radiates to her left pectoral region.  This pain awakened her out of her sleep.  It has been associated with mild shortness of breath and nausea.  No light-headedness or weakness.  No LOC.  She denies indigestion or acid reflux.  She feels that she has not experienced pain like this before.  She has not taken any other medications except for her prescribed narcotics in an attempt to alleviate her symptoms.  ED Course: Chest xray negative for acute process.  EKG shows NSR, no acute ST segment changes.  She has a mild leukocytosis.  She has an elevated reticulocyte count.  Hemoglobin appears to be at her baseline.  She has hypokalemia with potassium level of 2.9.  First troponin negative.  Pain level still 8 out of 10 after toradol, po dilaudid, then three doses of IV dilaudid.    Hospitalist asked to place in observation.    Review of Systems: As per HPI otherwise 10 point review of systems negative.    Past Medical History:  Diagnosis Date  . Anemia   . Depression, major, recurrent (HCC)   . Migraines   . Sickle cell anemia (HCC)     Past Surgical History:  Procedure Laterality Date  . CESAREAN SECTION    . CHOLECYSTECTOMY  2000  . IR GENERIC HISTORICAL  10/08/2015   IR US GUIDE VASC ACCESS RIGHT 10/08/2015 Simonne Come, MD [facility]INTERV RAD  . IR GENERIC HISTORICAL  10/08/2015   IR FLUORO GUIDE CV LINE RIGHT 10/08/2015 Simonne Come, MD  [facility]-INTERV RAD  . MULTIPLE TOOTH EXTRACTIONS N/A   . port a cath placement Right    about 6-7 years ago  . removal of porta cath Right 09/11/15  . TUBAL LIGATION       reports that she has quit smoking. She has never used smokeless tobacco. She reports that she does not drink alcohol or use drugs.  Allergies  Allergen Reactions  . Ultram [Tramadol] Other (See Comments)    seizures  . Zofran [Ondansetron Hcl] Nausea And Vomiting  . Buprenorphine Hcl Hives and Rash    Shaking Tolerates Percocet, Norco, and buprenorphine  . Morphine And Related Hives, Rash and Other (See Comments)    Shaking Tolerates Percocet, Norco, Dilaudid, and buprenorphine  . Tape Rash    Family History  Problem Relation Age of Onset  . Sickle cell trait Father   . Sickle cell trait Mother   . Sickle cell anemia Other   She denies any known history of premature CAD.   Prior to Admission medications   Medication Sig Start Date End Date Taking? Authorizing Provider  ARIPiprazole (ABILIFY) 10 MG tablet 1 tablet daily 02/13/16  Yes Historical Provider, MD  DULoxetine (CYMBALTA) 60 MG capsule TAKE 1 CAPSULE BY MOUTH DAILY. 11/27/15  Yes Quentin Angst, MD  folic acid (FOLVITE) 1 MG tablet Take 1 tablet (1 mg total) by mouth daily. 01/17/16  Yes Quentin Angstlugbemiga E Jegede, MD  hydroxyurea (HYDREA) 500 MG capsule TAKE 2 CAPSULES BY MOUTH DAILY. MAY TAKE WITH FOOD TO MINIMIZE GI SIDE EFFECTS. Patient taking differently: Take 500 mg by mouth daily.  08/08/15  Yes Massie MaroonLachina M [name], FNP  ibuprofen (ADVIL,MOTRIN) 200 MG tablet Take 400 mg by mouth every 6 (six) hours as needed for moderate pain.   Yes Historical Provider, MD  morphine (MS CONTIN) 30 MG 12 hr tablet Take 1 tablet (30 mg total) by mouth every 12 (twelve) hours. 03/10/16  Yes Quentin Angstlugbemiga E Jegede, MD  oxyCODONE-acetaminophen (PERCOCET) 10-325 MG tablet Take 1 tablet by mouth every 4 (four) hours as needed for pain. 03/10/16  Yes Quentin Angstlugbemiga E Jegede, MD  Topiramate  ER 100 MG CP24 Take 100 mg by mouth at bedtime.   Yes Historical Provider, MD    Physical Exam: Vitals:   03/14/16 0333 03/14/16 0400 03/14/16 0411 03/14/16 0453  BP: 106/67 108/75 108/75 105/74  Pulse: 91 90 86 87  Resp: 18 18 20 17   Temp:    98.9 F (37.2 C)  TempSrc:    Oral  SpO2: 92% 95% 93% 92%  Weight:          Constitutional: NAD, calm, comfortable, chronically ill appearing Vitals:   03/14/16 0333 03/14/16 0400 03/14/16 0411 03/14/16 0453  BP: 106/67 108/75 108/75 105/74  Pulse: 91 90 86 87  Resp: 18 18 20 17   Temp:    98.9 F (37.2 C)  TempSrc:    Oral  SpO2: 92% 95% 93% 92%  Weight:       Eyes: PERRL, lids and conjunctivae normal ENMT: Mucous membranes are moist. Posterior pharynx clear of any exudate or lesions. Normal dentition.  Neck: normal appearance, supple Respiratory: clear to auscultation bilaterally, no wheezing, no crackles. Normal respiratory effort. No accessory muscle use.  Cardiovascular: Normal rate, regular rhythm, no murmurs / rubs / gallops. No extremity edema. 2+ pedal pulses. GI: abdomen is soft and compressible.  No distention.  No tenderness.  Bowel sounds are present. Musculoskeletal:  No reproducible chest wall tenderness.  No joint deformity in upper and lower extremities. Good ROM, no contractures. Normal muscle tone.  Skin: no rashes, warm and dry Neurologic: CN 2-12 grossly intact. Sensation intact, Strength symmetric bilaterally, 5/5  Psychiatric: Normal judgment and insight. Alert and oriented x 3. Normal mood.     Labs on Admission: I have personally reviewed following labs and imaging studies  CBC:  Recent Labs Lab 03/08/16 0525 03/13/16 0230  WBC 11.9* 14.9*  NEUTROABS 6.7 9.1*  HGB 7.1* 7.6*  HCT 20.2* 21.8*  MCV 100.0 98.6  PLT 332 424*   Basic Metabolic Panel:  Recent Labs Lab 03/13/16 0230  NA 139  K 2.9*  CL 111  CO2 22  GLUCOSE 116*  BUN 8  CREATININE 0.39*  CALCIUM 8.8*   GFR: Estimated  Creatinine Clearance: 79.3 mL/min (by C-G formula based on SCr of 0.39 mg/dL (L)). Liver Function Tests:  Recent Labs Lab 03/13/16 0230  AST 41  ALT 22  ALKPHOS 95  BILITOT 1.6*  PROT 6.7  ALBUMIN 3.9   Anemia Panel:  Recent Labs  03/13/16 0230  RETICCTPCT >23.0*    Radiological Exams on Admission: Dg Chest 2 View  Result Date: 03/13/2016 CLINICAL DATA:  Sickle cell crisis pain EXAM: CHEST  2 VIEW COMPARISON:  03/07/2016 FINDINGS: A right-sided central venous catheter tip overlies the SVC. There is no acute consolidation or effusion. Stable borderline enlarged heart.  No overt failure. No pneumothorax. Surgical clips in the right upper quadrant. IMPRESSION: No acute infiltrate or edema. Electronically Signed   By: Jasmine Pang M.D.   On: 03/13/2016 23:14    EKG: Independently reviewed. NSR.  No acute ST segment changes.  Assessment/Plan Principal Problem:   Sickle cell crisis (HCC) Active Problems:   Chest pain      Sickle cell crisis but the patient is also complaining of atypical chest pain.  Wells score is zero, so I believe PE is unlikely.  No family history of premature CAD or significant risk factors.  Very likely this is also her sickle cell pain crisis. --Telemetry monitoring --Serial troponin.   --Trial of PPI --IV Toradol q6h --Oral MS contin --Dilaudid PCA per opioid tolerant protocol; I called pharmacy to get her last PCA point settings --Aggressive IV fluids with D5 1/2 NS at 125cc/hr --Continue hydroxyurea, folic acid  Hypokalemia  --Potassium po x 2  Depression --Continue cymbalta, abilify  Migraine headaches --Topamax   DVT prophylaxis:  SCDs Code Status: FULL Family Communication: Patient's daughter is asleep next to her in bed in the ED. Disposition Plan: Expect she will go home at discharge. Consults called: NONE Admission status: Place in observation, telemetry monitoring.   TIME SPENT: 60 minutes   Jerene Bears  MD [facility] Pager [phone]  If 7PM-7AM, please contact night-coverage [email or web address] Password [email or web address]  03/14/2016, 5:07 AM

## 2016-03-14 NOTE — Progress Notes (Signed)
Report called and given to RN.  Will transport pt via wheelchair.

## 2016-03-14 NOTE — ED Notes (Signed)
Asked pt if I could get her vitals, but she asked me not to. I have also tried to keep pt on cardiac monitor, but pt keeps disconnecting it.

## 2016-03-14 NOTE — ED Notes (Signed)
Pt made aware needs oral potassium related to low potassium; pt refuses medication.

## 2016-03-14 NOTE — Progress Notes (Signed)
Patient ID: Brittany Foster, female   DOB: 04/07/1985, 31 y.o.   MRN: [id number] Assessment and Plan: 1. Hypokalemia: Pt has agreed to take potassium orally. I have discussed with her the importance of maintaining a eukalemic state as low potassium is known to worsen vaso-occlusive crisis state.  2. Hb SS with crisis: Continue Toradol, Dilaudid via PCA and Oxycodone on a PRN basis. Continue IVF.  3. Anemia of Chronic Disease: Hb Stable. Continue Hydrea.  4. Chronic Pain Syndrome: Continue MS Contin 5. Chronic Opiate Use: Pt has an appointment at [facility] on Thursday. 6. Depression: Continue Abilify and Cymbalta    Interim History: Pt was admitted overnight and had a prolonged stay in the ED due to lack of available beds in the hospital.  In the ED she received several doses of scheduled IV Dilaudid as well as she was started on MS Contin and Oxycodone. Patient initially refused her oral medications because she was not able to be moved to the wards in a timely manner. However after an Production designer, theatre/television/film spoke with patient and reiterated that bed availability circumstances she agreed to take her medications. She was also noted to have a low potassium and has refused the oral potassium that was ordered overnight. Pt has had several re-assessments and remaind medically stable throughout her time in the ED. She has been transferred to the ward and is currently c/o pain in her hips at an intensity of 7/10 as compared to her baseline pain manageable with oral analgesics which is 5-6/10. .    Objective: Vitals:   03/14/16 1230 03/14/16 1232 03/14/16 1329 03/14/16 1349  BP: 115/75 115/75  114/66  Pulse: 92 107    Resp: 18 19 18    Temp:    98.5 F (36.9 C)  TempSrc:    Oral  SpO2: 97% 97% 98% 100%  Weight:    61.2 kg (134 lb 14.7 oz)  Height:    5' (1.524 m)   Weight change:  No intake or output data in the 24 hours ending 03/14/16 1557  General: Alert, awake, oriented x3, in no acute  distress.  HEENT: [place]/AT PEERL, EOMI, anicteric Neck: Trachea midline,  no masses, no thyromegal,y no JVD, no carotid bruit OROPHARYNX:  Moist, No exudate/ erythema/lesions.  Heart: Regular rate and rhythm, without murmurs, rubs, gallops, PMI non-displaced, no heaves or thrills on palpation.  Lungs: Clear to auscultation, no wheezing or rhonchi noted. No increased vocal fremitus resonant to percussion  Abdomen: Soft, nontender, nondistended, positive bowel sounds, no masses no hepatosplenomegaly noted.  Neuro: No focal neurological deficits noted cranial nerves II through XII grossly intact. . Strength at functional baseline in bilateral upper and lower extremities. Musculoskeletal: No warm swelling or erythema around joints, no spinal tenderness noted. Psychiatric: Patient alert and oriented x3, good insight and cognition, good recent to remote recall.    Data Reviewed: Basic Metabolic Panel:  Recent Labs Lab 03/13/16 0230 03/14/16 0848  NA 139 138  K 2.9* 3.2*  CL 111 113*  CO2 22 20*  GLUCOSE 116* 95  BUN 8 5*  CREATININE 0.39* 0.38*  CALCIUM 8.8* 8.4*  MG  --  1.9   Liver Function Tests:  Recent Labs Lab 03/13/16 0230  AST 41  ALT 22  ALKPHOS 95  BILITOT 1.6*  PROT 6.7  ALBUMIN 3.9   No results for input(s): LIPASE, AMYLASE in the last 168 hours. No results for input(s): AMMONIA in the last 168 hours. CBC:  Recent Labs Lab  03/08/16 0525 03/13/16 0230  WBC 11.9* 14.9*  NEUTROABS 6.7 9.1*  HGB 7.1* 7.6*  HCT 20.2* 21.8*  MCV 100.0 98.6  PLT 332 424*   Cardiac Enzymes:  Recent Labs Lab 03/14/16 0848 03/14/16 1405  TROPONINI <0.03 <0.03   BNP (last 3 results) No results for input(s): BNP in the last 8760 hours.  ProBNP (last 3 results) No results for input(s): PROBNP in the last 8760 hours.  CBG: No results for input(s): GLUCAP in the last 168 hours.  No results found for this or any previous visit (from the past 240 hour(s)).   Studies: Dg  Chest 2 View  Result Date: 03/13/2016 CLINICAL DATA:  Sickle cell crisis pain EXAM: CHEST  2 VIEW COMPARISON:  03/07/2016 FINDINGS: A right-sided central venous catheter tip overlies the SVC. There is no acute consolidation or effusion. Stable borderline enlarged heart. No overt failure. No pneumothorax. Surgical clips in the right upper quadrant. IMPRESSION: No acute infiltrate or edema. Electronically Signed   By: Jasmine PangKim  Fujinaga M.D.   On: 03/13/2016 23:14   Dg Chest 2 View  Result Date: 03/07/2016 CLINICAL DATA:  Sickle cell pain crisis.  Shortness of breath EXAM: CHEST  2 VIEW COMPARISON:  Two days ago FINDINGS: New blunting of the right costophrenic sulcus. No consolidating airspace disease. Mild linear opacity overlapping the posterior heart on the lateral projection is likely atelectasis. Chronic interstitial coarsening is stable. Prominent formed stool in the upper abdomen. Normal heart size and mediastinal contours. Central line on the right with tip at the upper right atrium. IMPRESSION: Trace right pleural fluid that is new from 2 days ago. No consolidating airspace disease. Electronically Signed   By: Marnee SpringJonathon  Watts M.D.   On: 03/07/2016 09:06   Dg Chest 2 View  Result Date: 03/05/2016 CLINICAL DATA:  [id number] year old female with chest pain. Hip and leg pain. Sickle cell crisis. Initial encounter. EXAM: CHEST  2 VIEW COMPARISON:  01/22/2016 chest radiographs and earlier. FINDINGS: Tunneled type right chest central catheter appears stable. Stable lung volumes, at the lower limits of normal. Stable mild elevation of the left hemidiaphragm. Mild cardiomegaly. Other mediastinal contours are within normal limits. Visualized tracheal air column is within normal limits. Stable pulmonary vascularity without acute edema. No pneumothorax, pleural effusion or confluent pulmonary opacity. Visualized osseous structures appear fairly unremarkable. Negative visible bowel gas pattern. IMPRESSION: No acute  cardiopulmonary abnormality. Electronically Signed   By: Odessa FlemingH  Hall M.D.   On: 03/05/2016 19:15    Scheduled Meds: . ARIPiprazole  10 mg Oral Daily  . DULoxetine  60 mg Oral Daily  . folic acid  1 mg Oral Daily  . HYDROmorphone   Intravenous Q4H  . hydroxyurea  500 mg Oral Daily  . ketorolac  30 mg Intravenous Q6H  . morphine  30 mg Oral Q12H  . pantoprazole  40 mg Oral Daily  . senna-docusate  1 tablet Oral BID  . topiramate  100 mg Oral QHS   Continuous Infusions: . dextrose 5 % and 0.45% NaCl 125 mL/hr at 03/14/16 1328    Principal Problem:   Sickle cell crisis (HCC) Active Problems:   Chest pain     In excess of 25 minutes. Greater than 50% involved face to face contact with the patient for assessment, counseling and coordination of care.

## 2016-03-14 NOTE — ED Notes (Signed)
With hourly rounding, pt monitoring equipment removed. Pt informed importance of having monitoring equipment on while receiving pain management. Pt verbalizes "I will just take it off when you are done taking it." Pt offered scheduled medications at this time and refuses medication "until speaking the the doctor about getting a room." Ashley RoyaltyMatthews paged by [employer] secretary at present time.

## 2016-03-14 NOTE — Progress Notes (Signed)
Pt. transferred to floor via wheelchair from 4th floor. Alert and oriented x 4, no respiratory distress noted.

## 2016-03-15 DIAGNOSIS — D5700 Hb-SS disease with crisis, unspecified: Principal | ICD-10-CM

## 2016-03-15 DIAGNOSIS — E876 Hypokalemia: Secondary | ICD-10-CM

## 2016-03-15 DIAGNOSIS — Z79891 Long term (current) use of opiate analgesic: Secondary | ICD-10-CM

## 2016-03-15 LAB — BASIC METABOLIC PANEL
Anion gap: 4 — ABNORMAL LOW (ref 5–15)
CALCIUM: 8.3 mg/dL — AB (ref 8.9–10.3)
CO2: 22 mmol/L (ref 22–32)
Chloride: 112 mmol/L — ABNORMAL HIGH (ref 101–111)
Creatinine, Ser: 0.39 mg/dL — ABNORMAL LOW (ref 0.44–1.00)
GFR calc Af Amer: >60 mL/min (ref >60)
GLUCOSE: 115 mg/dL — AB (ref 65–99)
Potassium: 3.2 mmol/L — ABNORMAL LOW (ref 3.5–5.1)
Sodium: 138 mmol/L (ref 135–145)

## 2016-03-15 LAB — MAGNESIUM: Magnesium: 2 mg/dL (ref 1.7–2.4)

## 2016-03-15 MED ORDER — POTASSIUM CHLORIDE CRYS ER 20 MEQ PO TBCR
20.0000 meq | EXTENDED_RELEASE_TABLET | Freq: Two times a day (BID) | ORAL | Status: DC
  Administered 2016-03-15 – 2016-03-16 (×2): 20 meq via ORAL
  Filled 2016-03-15 (×3): qty 1

## 2016-03-15 MED ORDER — HYDROMORPHONE 1 MG/ML IV SOLN
INTRAVENOUS | Status: DC
  Administered 2016-03-15 (×2): 1 mg via INTRAVENOUS
  Administered 2016-03-15: 18.69 mg via INTRAVENOUS
  Administered 2016-03-15: 9.99 mg via INTRAVENOUS
  Administered 2016-03-16: 6 mg via INTRAVENOUS
  Administered 2016-03-16: 1 mg via INTRAVENOUS
  Administered 2016-03-16: 10.2 mg via INTRAVENOUS
  Administered 2016-03-16: 11.6 mg via INTRAVENOUS
  Administered 2016-03-16: 1 mg via INTRAVENOUS
  Filled 2016-03-15 (×2): qty 25

## 2016-03-15 MED ORDER — POTASSIUM CHLORIDE CRYS ER 20 MEQ PO TBCR
40.0000 meq | EXTENDED_RELEASE_TABLET | ORAL | Status: AC
  Administered 2016-03-15 (×3): 40 meq via ORAL
  Filled 2016-03-15 (×2): qty 2

## 2016-03-15 MED ORDER — OXYCODONE-ACETAMINOPHEN 5-325 MG PO TABS
1.0000 | ORAL_TABLET | ORAL | Status: DC
Start: 2016-03-15 — End: 2016-03-16
  Administered 2016-03-15 – 2016-03-16 (×5): 1 via ORAL
  Filled 2016-03-15 (×5): qty 1

## 2016-03-15 MED ORDER — METOCLOPRAMIDE HCL 5 MG/ML IJ SOLN
10.0000 mg | Freq: Four times a day (QID) | INTRAMUSCULAR | Status: DC | PRN
  Administered 2016-03-15: 10 mg via INTRAVENOUS
  Filled 2016-03-15: qty 2

## 2016-03-15 MED ORDER — OXYCODONE HCL 5 MG PO TABS
5.0000 mg | ORAL_TABLET | ORAL | Status: DC
  Administered 2016-03-15 – 2016-03-16 (×5): 5 mg via ORAL
  Filled 2016-03-15 (×5): qty 1

## 2016-03-15 NOTE — Progress Notes (Signed)
SICKLE CELL SERVICE PROGRESS NOTE  Richmond CampbellMiranda Tabone ZOX:[id number] DOB: 03/03/1985 DOA: 03/13/2016 PCP: Jeanann LewandowskyJEGEDE, OLUGBEMIGA, MD  Assessment/Plan: Principal Problem:   Sickle cell crisis (HCC) Active Problems:   Chest pain  1. Hypokalemia: Potassium still low/Will continue oral replacement and re-check tomorrow. Magnesium normal.  2. Hb SS with crisis: Continue Toradol,  oral analgesics and Dilaudid PCA. Anticipate discharge home tomorrow. Pt demonstrated ambulation without any problems.  3. Leukocytosis: Pt had a mild leukocytosis without any signs of infection. Continue to monitor. 4. Anemia of Chronic Disease: Hb stable.  5. Chronic pain: Continue MS Contin 6. Chronic Opiate Dependence: Pt reports that she has an [facility] 03/19/2016.   Code Status: Full Code Family Communication: N/A Disposition Plan: Anticipate discharge home tomorrow.  Kenzly Rogoff A.  Pager [phone]. If 7PM-7AM, please contact night-coverage.  03/15/2016, 4:01 PM  LOS: 1 day   Interim History: Pt's pain improved since yesterday. Pain still localized at hips but at 6/10. She has used 40.3 mg with 82/69:demands/deliveries of Dilaudid in the last 24 hours. No BM.   Consultants:  None  Procedures:  None  Antibiotics:  None    Objective: Vitals:   03/15/16 0755 03/15/16 1016 03/15/16 1240 03/15/16 1352  BP:  (!) 96/57 (!) 99/58   Pulse:  93 87   Resp: 16 16 16 14   Temp:  98.4 F (36.9 C) 98 F (36.7 C)   TempSrc:  Oral Oral   SpO2: 96% 93% 98% 98%  Weight:      Height:       Weight change: 6.315 kg (13 lb 14.7 oz)  Intake/Output Summary (Last 24 hours) at 03/15/16 1601 Last data filed at 03/14/16 1916  Gross per 24 hour  Intake              240 ml  Output                0 ml  Net              240 ml    General: Alert, awake, oriented x3, in no apparent distress. In a very pleasant disposition today. She expresses that she is looking forward  to [facility]. HEENT: [place]/AT PEERL, EOMI, anicteric Neck: Trachea midline,  no masses, no thyromegal,y no JVD, no carotid bruit OROPHARYNX:  Moist, No exudate/ erythema/lesions.  Heart: Regular rate and rhythm, without murmurs, rubs, gallops, PMI non-displaced, no heaves or thrills on palpation.  Lungs: Clear to auscultation, no wheezing or rhonchi noted. No increased vocal fremitus resonant to percussion  Abdomen: Soft, nontender, nondistended, positive bowel sounds, no masses no hepatosplenomegaly noted..  Neuro: No focal neurological deficits noted cranial nerves II through XII grossly intact. Strength at functional baseline in bilateral upper and lower extremities. Musculoskeletal: No warm swelling or erythema around joints, no spinal tenderness noted. Psychiatric: Patient alert and oriented x3, good insight and cognition, good recent to remote recall.    Data Reviewed: Basic Metabolic Panel:  Recent Labs Lab 03/13/16 0230 03/14/16 0848 03/15/16 0500  NA 139 138 138  K 2.9* 3.2* 3.2*  CL 111 113* 112*  CO2 22 20* 22  GLUCOSE 116* 95 115*  BUN 8 5* <5*  CREATININE 0.39* 0.38* 0.39*  CALCIUM 8.8* 8.4* 8.3*  MG  --  1.9 2.0   Liver Function Tests:  Recent Labs Lab 03/13/16 0230  AST 41  ALT 22  ALKPHOS 95  BILITOT 1.6*  PROT 6.7  ALBUMIN 3.9   No results for input(s): LIPASE, AMYLASE in the last 168 hours. No results for input(s): AMMONIA in the last 168 hours. CBC:  Recent Labs Lab 03/13/16 0230  WBC 14.9*  NEUTROABS 9.1*  HGB 7.6*  HCT 21.8*  MCV 98.6  PLT 424*   Cardiac Enzymes:  Recent Labs Lab 03/14/16 0848 03/14/16 1405  TROPONINI <0.03 <0.03   BNP (last 3 results) No results for input(s): BNP in the last 8760 hours.  ProBNP (last 3 results) No results for input(s): PROBNP in the last 8760 hours.  CBG: No results for input(s): GLUCAP in the last 168 hours.  No results found for this or any previous visit (from  the past 240 hour(s)).   Studies: Dg Chest 2 View  Result Date: 03/13/2016 CLINICAL DATA:  Sickle cell crisis pain EXAM: CHEST  2 VIEW COMPARISON:  03/07/2016 FINDINGS: A right-sided central venous catheter tip overlies the SVC. There is no acute consolidation or effusion. Stable borderline enlarged heart. No overt failure. No pneumothorax. Surgical clips in the right upper quadrant. IMPRESSION: No acute infiltrate or edema. Electronically Signed   By: Jasmine Pang M.D.   On: 03/13/2016 23:14   Dg Chest 2 View  Result Date: 03/07/2016 CLINICAL DATA:  Sickle cell pain crisis.  Shortness of breath EXAM: CHEST  2 VIEW COMPARISON:  Two days ago FINDINGS: New blunting of the right costophrenic sulcus. No consolidating airspace disease. Mild linear opacity overlapping the posterior heart on the lateral projection is likely atelectasis. Chronic interstitial coarsening is stable. Prominent formed stool in the upper abdomen. Normal heart size and mediastinal contours. Central line on the right with tip at the upper right atrium. IMPRESSION: Trace right pleural fluid that is new from 2 days ago. No consolidating airspace disease. Electronically Signed   By: Marnee Spring M.D.   On: 03/07/2016 09:06   Dg Chest 2 View  Result Date: 03/05/2016 CLINICAL DATA:  31 year old female with chest pain. Hip and leg pain. Sickle cell crisis. Initial encounter. EXAM: CHEST  2 VIEW COMPARISON:  01/22/2016 chest radiographs and earlier. FINDINGS: Tunneled type right chest central catheter appears stable. Stable lung volumes, at the lower limits of normal. Stable mild elevation of the left hemidiaphragm. Mild cardiomegaly. Other mediastinal contours are within normal limits. Visualized tracheal air column is within normal limits. Stable pulmonary vascularity without acute edema. No pneumothorax, pleural effusion or confluent pulmonary opacity. Visualized osseous structures appear fairly unremarkable. Negative visible bowel gas  pattern. IMPRESSION: No acute cardiopulmonary abnormality. Electronically Signed   By: Odessa Fleming M.D.   On: 03/05/2016 19:15    Scheduled Meds: . ARIPiprazole  10 mg Oral Daily  . DULoxetine  60 mg Oral Daily  . folic acid  1 mg Oral Daily  . HYDROmorphone   Intravenous Q4H  . hydroxyurea  500 mg Oral Daily  . ketorolac  30 mg Intravenous Q6H  . morphine  30 mg Oral Q12H  . pantoprazole  40 mg Oral Daily  . potassium chloride  20 mEq Oral BID  . senna-docusate  1 tablet Oral BID  . topiramate  100 mg Oral QHS   Continuous Infusions: . dextrose 5 % and 0.45% NaCl 125 mL/hr at 03/15/16 1353    Principal Problem:   Sickle cell crisis (HCC) Active Problems:   Chest pain    In excess of 25 minutes spent during this visit. Greater than 50% involved face to face contact with the patient for assessment, counseling and  coordination of care.

## 2016-03-15 NOTE — Progress Notes (Signed)
Patient reported that  she vomited twice when she ambulated in the hallway, reported it an hour later.no actual vomiting observed.

## 2016-03-16 DIAGNOSIS — G894 Chronic pain syndrome: Secondary | ICD-10-CM

## 2016-03-16 DIAGNOSIS — F1190 Opioid use, unspecified, uncomplicated: Secondary | ICD-10-CM

## 2016-03-16 LAB — RETICULOCYTES
RBC.: 2.08 MIL/uL — ABNORMAL LOW (ref 3.87–5.11)
Retic Count, Absolute: 391 10*3/uL — ABNORMAL HIGH (ref 19.0–186.0)
Retic Ct Pct: 18.8 % — ABNORMAL HIGH (ref 0.4–3.1)

## 2016-03-16 LAB — CBC WITH DIFFERENTIAL/PLATELET
BASOS PCT: 0 %
Basophils Absolute: 0 10*3/uL (ref 0.0–0.1)
EOS ABS: 0.3 10*3/uL (ref 0.0–0.7)
Eosinophils Relative: 2 %
HEMATOCRIT: 21.3 % — AB (ref 36.0–46.0)
HEMOGLOBIN: 7.5 g/dL — AB (ref 12.0–15.0)
LYMPHS PCT: 28 %
Lymphs Abs: 3.8 10*3/uL (ref 0.7–4.0)
MCH: 36.1 pg — AB (ref 26.0–34.0)
MCHC: 35.2 g/dL (ref 30.0–36.0)
MCV: 102.4 fL — ABNORMAL HIGH (ref 78.0–100.0)
MONOS PCT: 7 %
Monocytes Absolute: 1 10*3/uL (ref 0.1–1.0)
NEUTROS ABS: 8.5 10*3/uL — AB (ref 1.7–7.7)
Neutrophils Relative %: 63 %
Platelets: 397 10*3/uL (ref 150–400)
RBC: 2.08 MIL/uL — ABNORMAL LOW (ref 3.87–5.11)
RDW: 23.4 % — ABNORMAL HIGH (ref 11.5–15.5)
WBC: 13.6 10*3/uL — ABNORMAL HIGH (ref 4.0–10.5)

## 2016-03-16 LAB — BASIC METABOLIC PANEL
ANION GAP: 4 — AB (ref 5–15)
BUN: 9 mg/dL (ref 6–20)
CALCIUM: 8.7 mg/dL — AB (ref 8.9–10.3)
CO2: 20 mmol/L — AB (ref 22–32)
Chloride: 114 mmol/L — ABNORMAL HIGH (ref 101–111)
Creatinine, Ser: 0.48 mg/dL (ref 0.44–1.00)
GFR calc Af Amer: >60 mL/min (ref >60)
GFR calc non Af Amer: >60 mL/min (ref >60)
GLUCOSE: 150 mg/dL — AB (ref 65–99)
Potassium: 4.5 mmol/L (ref 3.5–5.1)
Sodium: 138 mmol/L (ref 135–145)

## 2016-03-16 MED ORDER — HEPARIN SOD (PORK) LOCK FLUSH 100 UNIT/ML IV SOLN
250.0000 [IU] | INTRAVENOUS | Status: AC | PRN
  Administered 2016-03-16: 250 [IU]
  Filled 2016-03-16: qty 5

## 2016-03-16 NOTE — Discharge Summary (Signed)
Brittany Foster MRN: [id number] DOB/AGE: 1986/02/10 30 y.o.  Admit date: 03/13/2016 Discharge date: 03/16/2016  Primary Care Physician:  Jeanann Lewandowsky, MD   Discharge Diagnoses:   Patient Active Problem List   Diagnosis Date Noted  . Sickle cell anemia with pain (HCC) 02/14/2016  . Hypotension 01/26/2016  . Opiate dependence (HCC) 01/23/2016  . MDD (major depressive disorder), recurrent severe, without psychosis (HCC) 01/10/2016  . Vitamin D deficiency 01/08/2015  . Chronic pain syndrome 01/08/2015  . 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 Anemia 02/09/2014  . Neuropathy (HCC) 02/09/2014    DISCHARGE MEDICATION: Allergies as of 03/16/2016      Reactions   Ultram [tramadol] Other (See Comments)   seizures   Zofran [ondansetron Hcl] Nausea And Vomiting   Buprenorphine Hcl Hives, Rash   Shaking Tolerates Percocet, Norco, and buprenorphine   Morphine And Related Hives, Rash, Other (See Comments)   Shaking Tolerates Percocet, Norco, Dilaudid, and buprenorphine   Tape Rash      Medication List    TAKE these medications   ARIPiprazole 10 MG tablet Commonly known as:  ABILIFY 1 tablet daily   DULoxetine 60 MG capsule Commonly known as:  CYMBALTA TAKE 1 CAPSULE BY MOUTH DAILY.   folic acid 1 MG tablet Commonly known as:  FOLVITE Take 1 tablet (1 mg total) by mouth daily.   hydroxyurea 500 MG capsule Commonly known as:  HYDREA TAKE 2 CAPSULES BY MOUTH DAILY. MAY TAKE WITH FOOD TO MINIMIZE GI SIDE EFFECTS. What changed:  how much to take  how to take this  when to take this  additional instructions   ibuprofen 200 MG tablet Commonly known as:  ADVIL,MOTRIN Take 400 mg by mouth every 6 (six) hours as needed for moderate pain.   morphine 30 MG 12 hr tablet Commonly known as:  MS CONTIN Take 1 tablet (30 mg total) by mouth every 12 (twelve) hours.   oxyCODONE-acetaminophen 10-325 MG tablet Commonly known as:  PERCOCET Take 1 tablet by mouth every  4 (four) hours as needed for pain.   Topiramate ER 100 MG Cp24 Take 100 mg by mouth at bedtime.         Consults:    SIGNIFICANT DIAGNOSTIC STUDIES:  Dg Chest 2 View  Result Date: 03/13/2016 CLINICAL DATA:  Sickle cell crisis pain EXAM: CHEST  2 VIEW COMPARISON:  03/07/2016 FINDINGS: A right-sided central venous catheter tip overlies the SVC. There is no acute consolidation or effusion. Stable borderline enlarged heart. No overt failure. No pneumothorax. Surgical clips in the right upper quadrant. IMPRESSION: No acute infiltrate or edema. Electronically Signed   By: Jasmine Pang M.D.   On: 03/13/2016 23:14   Dg Chest 2 View  Result Date: 03/07/2016 CLINICAL DATA:  Sickle cell pain crisis.  Shortness of breath EXAM: CHEST  2 VIEW COMPARISON:  Two days ago FINDINGS: New blunting of the right costophrenic sulcus. No consolidating airspace disease. Mild linear opacity overlapping the posterior heart on the lateral projection is likely atelectasis. Chronic interstitial coarsening is stable. Prominent formed stool in the upper abdomen. Normal heart size and mediastinal contours. Central line on the right with tip at the upper right atrium. IMPRESSION: Trace right pleural fluid that is new from 2 days ago. No consolidating airspace disease. Electronically Signed   By: Marnee Spring M.D.   On: 03/07/2016 09:06   Dg Chest 2 View  Result Date: 03/05/2016 CLINICAL DATA:  31 year old female with chest pain. Hip and leg pain.  Sickle cell crisis. Initial encounter. EXAM: CHEST  2 VIEW COMPARISON:  01/22/2016 chest radiographs and earlier. FINDINGS: Tunneled type right chest central catheter appears stable. Stable lung volumes, at the lower limits of normal. Stable mild elevation of the left hemidiaphragm. Mild cardiomegaly. Other mediastinal contours are within normal limits. Visualized tracheal air column is within normal limits. Stable pulmonary vascularity without acute edema. No pneumothorax, pleural  effusion or confluent pulmonary opacity. Visualized osseous structures appear fairly unremarkable. Negative visible bowel gas pattern. IMPRESSION: No acute cardiopulmonary abnormality. Electronically Signed   By: Odessa Fleming M.D.   On: 03/05/2016 19:15       No results found for this or any previous visit (from the past 240 hour(s)).  BRIEF ADMITTING H & P: Brittany Foster is a 31 y.o. woman with a history of sickle cell disease, depression, and migraine headaches who has frequent presentations for inpatient management of sickle cell crisis.  Tonight, she is reporting that she is has bilateral hip, leg, and back pain, which are typical for her pain crises.  She is also complaining of 9 out of 10 sharp substernal chest pain that radiates to her left pectoral region.  This pain awakened her out of her sleep.  It has been associated with mild shortness of breath and nausea.  No light-headedness or weakness.  No LOC.  She denies indigestion or acid reflux.  She feels that she has not experienced pain like this before.  She has not taken any other medications except for her prescribed narcotics in an attempt to alleviate her symptoms.  ED Course: Chest xray negative for acute process.  EKG shows NSR, no acute ST segment changes.  She has a mild leukocytosis.  She has an elevated reticulocyte count.  Hemoglobin appears to be at her baseline.  She has hypokalemia with potassium level of 2.9.  First troponin negative.  Pain level still 8 out of 10 after toradol, po dilaudid, then three doses of IV dilaudid.    Hospitalist asked to place in observation.     Hospital Course:  Present on Admission: . (Resolved) Sickle cell crisis (HCC) . (Resolved) Chest pain  Pt was admitted with Hb S with crisis in a setting of chronic pain,  hypokalemia and Anemia of Chronic Disease. She was managed with IVF, Toradol,  Dilaudid via PCA and continued on Chronic pain medications. Her pain decreased to the point of  functional ability where she was able to ambulate without any assistance and without any increase in HR with ambulation. Her saturations were normal on RA during ambulation. She is discharged home in stable condition.   Disposition and Follow-up: Pt is discharged home in good condition. She has an appointment with Ms. [name] at the clinic on 04/13/2016. However she has requested an earlier appointment with D.r Hyman Hopes. I spoke with Dr. Hyman Hopes who has advised that patient can go to the clinic on any Tuesday. She is scheduled to go for treatment of her opiate dependence on Thursday. I would recommend against Methadone as she has a h/o prolonged QTc with doses of Methadone as low as 10 mg BID. I would recommend Suboxone as her maintenance medication.    Discharge Instructions    Activity as tolerated - No restrictions    Complete by:  As directed    Diet general    Complete by:  As directed       DISCHARGE EXAM:  General: Alert, awake, oriented x3, in no apparent distress.  HEENT: [place]/AT  PEERL, EOMI, anicteric Neck: Trachea midline, no masses, no thyromegal,y no JVD, no carotid bruit OROPHARYNX: Moist, No exudate/ erythema/lesions.  Heart: Regular rate and rhythm, without murmurs, rubs, gallops or S3. PMI non-displaced. Exam reveals no decreased pulses. Pulmonary/Chest: Normal effort. Breath sounds normal. No. Apnea. Clear to auscultation,no stridor,  no wheezing and no rhonchi noted. No respiratory distress and no tenderness noted. Abdomen: Soft, nontender, nondistended, normal bowel sounds, no masses no hepatosplenomegaly noted. No fluid wave and no ascites. There is no guarding or rebound. Neuro: Alert and oriented to person, place and time. Normal motor skills, Displays no atrophy or tremors and exhibits normal muscle tone.  No focal neurological deficits noted cranial nerves II through XII grossly intact. No sensory deficit noted. Strength at baseline in bilateral upper and lower extremities.  Gait normal. Musculoskeletal: No warmth, swelling or erythema around joints, no spinal tenderness noted. Psychiatric: Patient alert and oriented x3, good insight and cognition, good recent to remote recall. Mood, memory, affect and judgement normal. Pt expresses some anxiety around going to the treatment center  Skin: Skin is warm and dry. No bruising, no ecchymosis and no rash noted. Pt is not diaphoretic. No erythema. No pallor   Blood pressure 104/60, pulse 83, temperature 97.9 F (36.6 C), temperature source Oral, resp. rate 15, height 5' (1.524 m), weight 57.2 kg (126 lb), SpO2 97 %.   Recent Labs  03/14/16 0848 03/15/16 0500 03/16/16 0521  NA 138 138 138  K 3.2* 3.2* 4.5  CL 113* 112* 114*  CO2 20* 22 20*  GLUCOSE 95 115* 150*  BUN 5* <5* 9  CREATININE 0.38* 0.39* 0.48  CALCIUM 8.4* 8.3* 8.7*  MG 1.9 2.0  --    No results for input(s): AST, ALT, ALKPHOS, BILITOT, PROT, ALBUMIN in the last 72 hours. No results for input(s): LIPASE, AMYLASE in the last 72 hours.  Recent Labs  03/16/16 0521  WBC 13.6*  NEUTROABS 8.5*  HGB 7.5*  HCT 21.3*  MCV 102.4*  PLT 397     Total time spent including face to face and decision making was greater than 30 minutes  Signed: [name],[name] 03/16/2016, 2:08 PM

## 2016-03-23 ENCOUNTER — Telehealth: Payer: Self-pay

## 2016-03-24 ENCOUNTER — Other Ambulatory Visit: Payer: Self-pay | Admitting: Internal Medicine

## 2016-03-24 DIAGNOSIS — D5700 Hb-SS disease with crisis, unspecified: Secondary | ICD-10-CM

## 2016-03-24 MED ORDER — OXYCODONE-ACETAMINOPHEN 10-325 MG PO TABS: 1.0000 | ORAL_TABLET | ORAL | 0 refills | Status: DC | PRN

## 2016-03-30 ENCOUNTER — Telehealth (HOSPITAL_COMMUNITY): Payer: Self-pay | Admitting: *Deleted

## 2016-03-30 NOTE — Telephone Encounter (Signed)
Pt called [facility] to come [facility]. Pt stated her pain was  In her legs, hips and back. She took her last meds of oxy and MS Cont at 6am.  She states he is nauseated, but denies fever, chest pain, nausea or vomiting, diarrhea and abdominal pain.  Pt was told that she would be on hold while checking with the provider. Pt voiced understanding.  After speaking with the provider, pt was told to cont to take her pain med and hydrate. If she has chest pain or fever to go to the emergency. Pt voiced understanding.

## 2016-03-31 ENCOUNTER — Telehealth (HOSPITAL_COMMUNITY): Payer: Self-pay | Admitting: *Deleted

## 2016-03-31 ENCOUNTER — Encounter (HOSPITAL_COMMUNITY): Payer: Self-pay | Admitting: *Deleted

## 2016-03-31 ENCOUNTER — Non-Acute Institutional Stay (HOSPITAL_COMMUNITY)
Admission: AD | Admit: 2016-03-31 | Discharge: 2016-03-31 | Disposition: A | Discharge status: Home / Self Care | Payer: Medicaid Other | Source: Ambulatory Visit | Attending: Internal Medicine | Admitting: Internal Medicine

## 2016-03-31 DIAGNOSIS — Z791 Long term (current) use of non-steroidal anti-inflammatories (NSAID): Secondary | ICD-10-CM | POA: Insufficient documentation

## 2016-03-31 DIAGNOSIS — I517 Cardiomegaly: Secondary | ICD-10-CM | POA: Diagnosis not present

## 2016-03-31 DIAGNOSIS — R52 Pain, unspecified: Secondary | ICD-10-CM | POA: Diagnosis not present

## 2016-03-31 DIAGNOSIS — Z79891 Long term (current) use of opiate analgesic: Secondary | ICD-10-CM | POA: Diagnosis not present

## 2016-03-31 DIAGNOSIS — R079 Chest pain, unspecified: Secondary | ICD-10-CM | POA: Diagnosis not present

## 2016-03-31 DIAGNOSIS — D5700 Hb-SS disease with crisis, unspecified: Secondary | ICD-10-CM | POA: Diagnosis not present

## 2016-03-31 DIAGNOSIS — Z79899 Other long term (current) drug therapy: Secondary | ICD-10-CM | POA: Insufficient documentation

## 2016-03-31 LAB — CBC WITH DIFFERENTIAL/PLATELET
BASOS PCT: 1 %
Basophils Absolute: 0.1 10*3/uL (ref 0.0–0.1)
EOS PCT: 0 %
Eosinophils Absolute: 0 10*3/uL (ref 0.0–0.7)
HEMATOCRIT: 23.8 % — AB (ref 36.0–46.0)
HEMOGLOBIN: 8.2 g/dL — AB (ref 12.0–15.0)
Lymphocytes Relative: 28 %
Lymphs Abs: 3.5 10*3/uL (ref 0.7–4.0)
MCH: 34 pg (ref 26.0–34.0)
MCHC: 34.5 g/dL (ref 30.0–36.0)
MCV: 98.8 fL (ref 78.0–100.0)
MONOS PCT: 8 %
Monocytes Absolute: 1 10*3/uL (ref 0.1–1.0)
NEUTROS ABS: 7.8 10*3/uL — AB (ref 1.7–7.7)
NRBC: 3 /100{WBCs} — AB
Neutrophils Relative %: 63 %
Platelets: 454 10*3/uL — ABNORMAL HIGH (ref 150–400)
RBC: 2.41 MIL/uL — ABNORMAL LOW (ref 3.87–5.11)
RDW: 21.3 % — ABNORMAL HIGH (ref 11.5–15.5)
WBC: 12.4 10*3/uL — ABNORMAL HIGH (ref 4.0–10.5)

## 2016-03-31 LAB — URINALYSIS, ROUTINE W REFLEX MICROSCOPIC
Bilirubin Urine: NEGATIVE
GLUCOSE, UA: NEGATIVE mg/dL
HGB URINE DIPSTICK: NEGATIVE
Ketones, ur: NEGATIVE mg/dL
NITRITE: NEGATIVE
PH: 5 (ref 5.0–8.0)
PROTEIN: NEGATIVE mg/dL
Specific Gravity, Urine: 1.014 (ref 1.005–1.030)

## 2016-03-31 LAB — RETICULOCYTES
RBC.: 2.41 MIL/uL — ABNORMAL LOW (ref 3.87–5.11)
Retic Count, Absolute: 397.7 10*3/uL — ABNORMAL HIGH (ref 19.0–186.0)
Retic Ct Pct: 16.5 % — ABNORMAL HIGH (ref 0.4–3.1)

## 2016-03-31 MED ORDER — ONDANSETRON HCL 4 MG/2ML IJ SOLN: 4.0000 mg | Freq: Four times a day (QID) | INTRAMUSCULAR | Status: DC | PRN

## 2016-03-31 MED ORDER — NALOXONE HCL 0.4 MG/ML IJ SOLN: 0.4000 mg | INTRAMUSCULAR | Status: DC | PRN

## 2016-03-31 MED ORDER — HEPARIN SOD (PORK) LOCK FLUSH 100 UNIT/ML IV SOLN: 250.0000 [IU] | INTRAVENOUS | Status: DC | PRN

## 2016-03-31 MED ORDER — SODIUM CHLORIDE 0.9% FLUSH: 10.0000 mL | INTRAVENOUS | Status: DC | PRN

## 2016-03-31 MED ORDER — POLYETHYLENE GLYCOL 3350 17 G PO PACK: 17.0000 g | PACK | Freq: Every day | ORAL | Status: DC | PRN

## 2016-03-31 MED ORDER — DIPHENHYDRAMINE HCL 25 MG PO CAPS
25.0000 mg | ORAL_CAPSULE | ORAL | Status: DC | PRN
Start: 2016-03-31 — End: 2016-03-31
  Administered 2016-03-31: 25 mg via ORAL
  Filled 2016-03-31: qty 1

## 2016-03-31 MED ORDER — SODIUM CHLORIDE 0.9 % IV SOLN
25.0000 mg | INTRAVENOUS | Status: DC | PRN
  Filled 2016-03-31: qty 0.5

## 2016-03-31 MED ORDER — SODIUM CHLORIDE 0.9% FLUSH: 9.0000 mL | INTRAVENOUS | Status: DC | PRN

## 2016-03-31 MED ORDER — SENNOSIDES-DOCUSATE SODIUM 8.6-50 MG PO TABS: 1.0000 | ORAL_TABLET | Freq: Two times a day (BID) | ORAL | Status: DC

## 2016-03-31 MED ORDER — DEXTROSE-NACL 5-0.45 % IV SOLN
INTRAVENOUS | Status: DC
  Administered 2016-03-31: 11:00:00 via INTRAVENOUS

## 2016-03-31 MED ORDER — KETOROLAC TROMETHAMINE 30 MG/ML IJ SOLN
30.0000 mg | Freq: Four times a day (QID) | INTRAMUSCULAR | Status: DC
  Administered 2016-03-31: 30 mg via INTRAVENOUS
  Filled 2016-03-31: qty 1

## 2016-03-31 MED ORDER — PROMETHAZINE HCL 25 MG PO TABS
25.0000 mg | ORAL_TABLET | Freq: Once | ORAL | Status: AC
Start: 2016-03-31 — End: 2016-03-31
  Administered 2016-03-31: 25 mg via ORAL
  Filled 2016-03-31: qty 1

## 2016-03-31 MED ORDER — HYDROMORPHONE 1 MG/ML IV SOLN
INTRAVENOUS | Status: DC
  Administered 2016-03-31: 13 mg via INTRAVENOUS
  Administered 2016-03-31: 25 mg via INTRAVENOUS
  Filled 2016-03-31: qty 25

## 2016-03-31 NOTE — Telephone Encounter (Signed)
[facility]. Pt states her pain is 8/10 and it is in her hips and back.She took her last oxy and MS at 6am. She denies fever, vomiting,diarrhea, chest pain or abd pain. She stated she had been nauseated and did not take anything for it. Will check with the provider and give her a call back. Pt voiced understanding.

## 2016-03-31 NOTE — Telephone Encounter (Signed)
Call patient back after speaking with the provider, that she could come in for treatment. Pt voiced understanding.

## 2016-03-31 NOTE — H&P (Signed)
[facility] History and Physical  Brittany Foster WUJ:[id number] DOB: 05/04/1985 DOA: 03/31/2016  PCP: Jeanann Lewandowsky, MD   Chief Complaint: Sickle Cell Pain  HPI: Brittany Foster is a 31 y.o. female with history of sickle cell disease, opiate tolerant/dependent, frequent hospital admissions and ED visits, discharged from [facility] 2 days ago after about 5 days admission, and she was discharged from [facility] hospital on 03/16/2016 only 5 days before presenting to [facility]. She is here today complaining of sickle cell related pain all over. When asked location of her pain, she said "same old same old", rated at 9/10, al over. She admitted to being dependent on opioid and ready to go into Rehab, has a date in mind (04/02/2016. She denies any fever, no chest pain, no SOB, no cough, no urinary symptom, no dizziness or headache. She is not adherent with her maintenance medications except the opiates.   Systemic Review: General: The patient denies anorexia, fever, weight loss Cardiac: Denies chest pain, syncope, palpitations, pedal edema  Respiratory: Denies cough, shortness of breath, wheezing GI: Denies severe indigestion/heartburn, abdominal pain, nausea, vomiting, diarrhea and constipation GU: Denies hematuria, incontinence, dysuria  Musculoskeletal: Denies arthritis  Skin: Denies suspicious skin lesions Neurologic: Denies focal weakness or numbness, change in vision  Past Medical History:  Diagnosis Date  . Anemia   . Depression, major, recurrent (HCC)   . Migraines   . Sickle cell anemia (HCC)     Past Surgical History:  Procedure Laterality Date  . CESAREAN SECTION    . CHOLECYSTECTOMY  2000  . IR GENERIC HISTORICAL  10/08/2015   IR US GUIDE VASC ACCESS RIGHT 10/08/2015 Simonne Come, MD [facility]  . IR GENERIC HISTORICAL  10/08/2015   IR FLUORO GUIDE CV LINE RIGHT 10/08/2015 Simonne Come, MD [facility]  . MULTIPLE TOOTH EXTRACTIONS N/A   . port a cath placement Right    about 6-7 years ago  . removal of porta cath Right 09/11/15  . TUBAL LIGATION      Allergies  Allergen Reactions  . Ultram [Tramadol] Other (See Comments)    seizures  . Zofran [Ondansetron Hcl] Nausea And Vomiting  . Buprenorphine Hcl Hives and Rash    Shaking Tolerates Percocet, Norco, and buprenorphine  . Morphine And Related Hives, Rash and Other (See Comments)    Shaking Tolerates Percocet, Norco, Dilaudid, and buprenorphine  . Tape Rash    Family History  Problem Relation Age of Onset  . Sickle cell trait Father   . Sickle cell trait Mother   . Sickle cell anemia Other       Prior to Admission medications   Medication Sig Start Date End Date Taking? Authorizing Provider  ARIPiprazole (ABILIFY) 10 MG tablet 1 tablet daily 02/13/16   Historical Provider, MD  DULoxetine (CYMBALTA) 60 MG capsule TAKE 1 CAPSULE BY MOUTH DAILY. 11/27/15   Quentin Angst, MD  folic acid (FOLVITE) 1 MG tablet Take 1 tablet (1 mg total) by mouth daily. 01/17/16   Quentin Angst, MD  hydroxyurea (HYDREA) 500 MG capsule TAKE 2 CAPSULES BY MOUTH DAILY. MAY TAKE WITH FOOD TO MINIMIZE GI SIDE EFFECTS. Patient taking differently: Take 500 mg by mouth daily.  08/08/15   [name], FNP  ibuprofen (ADVIL,MOTRIN) 200 MG tablet Take 400 mg by mouth every 6 (six) hours as needed for moderate pain.    Historical Provider, MD  morphine (MS CONTIN) 30 MG 12 hr tablet Take 1 tablet (30 mg total)  by mouth every 12 (twelve) hours. 03/10/16   Quentin Angstlugbemiga E Coyle Stordahl, MD  oxyCODONE-acetaminophen (PERCOCET) 10-325 MG tablet Take 1 tablet by mouth every 4 (four) hours as needed for pain. 03/24/16   Quentin Angstlugbemiga E Ayliana Casciano, MD  Topiramate ER 100 MG CP24 Take 100 mg by mouth at bedtime.    Historical Provider, MD     Physical Exam: There were no vitals filed for this visit.  General: Alert, awake, afebrile, anicteric, not in obvious distress HEENT: Normocephalic and Atraumatic, Mucous membranes pink                 PERRLA; EOM intact; No scleral icterus,                 Nares: Patent, Oropharynx: Clear, Fair Dentition                 Neck: FROM, no cervical lymphadenopathy, thyromegaly, carotid bruit or JVD;  CHEST WALL: No tenderness  CHEST: Normal respiration, clear to auscultation bilaterally  HEART: Regular rate and rhythm; no murmurs rubs or gallops  BACK: No kyphosis or scoliosis; no CVA tenderness  ABDOMEN: Positive Bowel Sounds, soft, non-tender; no masses, no organomegaly EXTREMITIES: No cyanosis, clubbing, or edema SKIN:  no rash or ulceration  CNS: Alert and Oriented x 4, Nonfocal exam, CN 2-12 intact  Labs on Admission:  Basic Metabolic Panel: No results for input(s): NA, K, CL, CO2, GLUCOSE, BUN, CREATININE, CALCIUM, MG, PHOS in the last 168 hours. Liver Function Tests: No results for input(s): AST, ALT, ALKPHOS, BILITOT, PROT, ALBUMIN in the last 168 hours. No results for input(s): LIPASE, AMYLASE in the last 168 hours. No results for input(s): AMMONIA in the last 168 hours. CBC: No results for input(s): WBC, NEUTROABS, HGB, HCT, MCV, PLT in the last 168 hours. Cardiac Enzymes: No results for input(s): CKTOTAL, CKMB, CKMBINDEX, TROPONINI in the last 168 hours.  BNP (last 3 results) No results for input(s): BNP in the last 8760 hours.  ProBNP (last 3 results) No results for input(s): PROBNP in the last 8760 hours.  CBG: No results for input(s): GLUCAP in the last 168 hours.   Assessment/Plan Active Problems:   Sickle cell anemia with pain (HCC)   Admits to the Day Hospital  IVF D5 .45% Saline @ 125 mls/hour  Weight based Dilaudid PCA started within 30 minutes of admission  IV Toradol 30 mg Q 6 H  Monitor vitals very closely, Re-evaluate pain scale every hour  2 L of Oxygen by [place]  Patient will be re-evaluated for pain in the context of function and relationship to baseline as care progresses.  If no significant relieve from pain (remains above 5/10) will  transfer patient to inpatient services for further evaluation and management  Code Status: Full  Family Communication: None  DVT Prophylaxis: Ambulate as tolerated   Time spent: 35 Minutes  Herberth Deharo, MD, MHA, FACP, FAAP, CPE  If 7PM-7AM, please contact night-coverage [email or web address] 03/31/2016, 10:23 AM

## 2016-03-31 NOTE — Discharge Summary (Signed)
Physician Discharge Summary  Brittany Foster WUJ:[id number] DOB: January 08, 1986 DOA: 03/31/2016  PCP: Jeanann Lewandowsky, MD  Admit date: 03/31/2016  Discharge date: 03/31/2016  Time spent: 30 minutes  Discharge Diagnoses:  Active Problems:   Sickle cell anemia with pain [facility])  Discharge Condition: Stable  Diet recommendation: Regular  Filed Weights   03/31/16 1027  Weight: 126 lb (57.2 kg)   History of present illness:  Brittany Foster is a 31 y.o. female with history of sickle cell disease, opiate tolerant/dependent, frequent hospital admissions and ED visits, discharged from [facility] 2 days ago after about 5 days admission, and she was discharged from [facility] hospital on 03/16/2016 only 5 days before presenting to [place]. She is here today complaining of sickle cell related pain all over. When asked location of Brittany Foster pain, she said "same old same old", rated at 9/10, al over. She admitted to being dependent on opioid and ready to go into Rehab, has a date in mind (04/02/2016. She denies any fever, no chest pain, no SOB, no cough, no urinary symptom, no dizziness or headache. She is not adherent with Brittany Foster maintenance medications except the opiates.   Hospital Course:  Brittany Foster was admitted to the day hospital with sickle cell painful crisis. Patient was treated with weight based IV Dilaudid PCA, IV Toradol as well as IV fluids. Brittany Foster showed significant improvement symptomatically, pain improved from 9 to 5/10 at the time of discharge. Patient was discharged home is a hemodynamically stable condition. Brittany Foster will follow-up at the clinic as previously scheduled, continue with home medications as per prior to admission.   Patient agrees that she is dependent on opioid and again agrees to check-in to Rehab on Thursday 04/02/2016. She was informed and educated on the importance of this step in Brittany Foster ongoing sickle cell disease and pain management. Our LCSW had made all necessary arrangement for a  smooth admission to the Rehab facility. This is the 3rd attempt at getting patient through what we think is medically necessary to prevent this dog-tail's chase of Brittany Foster medical management. Patient is aware that if she fails to comply this time, she will need to find another medical provider.  Discharge Instructions We discussed the need for good hydration, monitoring of hydration status, avoidance of heat, cold, stress, and infection triggers. We discussed the need to be compliant with taking Hydrea. Brittany Foster was reminded of the need to seek medical attention of any symptoms of bleeding, anemia, or infection occurs.  Discharge Exam: Vitals:   03/31/16 1305 03/31/16 1427  BP: 104/69 111/74  Pulse: 92 (!) 108  Resp:    Temp: 98.8 F (37.1 C) 98.6 F (37 C)   General appearance: alert, cooperative and no distress Eyes: conjunctivae/corneas clear. PERRL, EOM's intact. Fundi benign. Neck: no adenopathy, no carotid bruit, no JVD, supple, symmetrical, trachea midline and thyroid not enlarged, symmetric, no tenderness/mass/nodules Back: symmetric, no curvature. ROM normal. No CVA tenderness. Resp: clear to auscultation bilaterally Chest wall: no tenderness Cardio: regular rate and rhythm, S1, S2 normal, no murmur, click, rub or gallop GI: soft, non-tender; bowel sounds normal; no masses, no organomegaly Extremities: extremities normal, atraumatic, no cyanosis or edema Pulses: 2+ and symmetric Skin: Skin color, texture, turgor normal. No rashes or lesions Neurologic: Grossly normal  Discharge Instructions    Diet - low sodium heart healthy    Complete by:  As directed    Increase activity slowly    Complete by:  As directed      Discharge  Medication List as of 03/31/2016  4:09 PM    CONTINUE these medications which have NOT CHANGED   Details  ARIPiprazole (ABILIFY) 10 MG tablet 1 tablet daily, Historical Med    DULoxetine (CYMBALTA) 60 MG capsule TAKE 1 CAPSULE BY MOUTH DAILY., Normal     folic acid (FOLVITE) 1 MG tablet Take 1 tablet (1 mg total) by mouth daily., Starting Fri 01/17/2016, Normal    hydroxyurea (HYDREA) 500 MG capsule TAKE 2 CAPSULES BY MOUTH DAILY. MAY TAKE WITH FOOD TO MINIMIZE GI SIDE EFFECTS., Normal    ibuprofen (ADVIL,MOTRIN) 200 MG tablet Take 400 mg by mouth every 6 (six) hours as needed for moderate pain., Historical Med    morphine (MS CONTIN) 30 MG 12 hr tablet Take 1 tablet (30 mg total) by mouth every 12 (twelve) hours., Starting Tue 03/10/2016, Print    oxyCODONE-acetaminophen (PERCOCET) 10-325 MG tablet Take 1 tablet by mouth every 4 (four) hours as needed for pain., Starting Tue 03/24/2016, Print    Topiramate ER 100 MG CP24 Take 100 mg by mouth at bedtime., Historical Med       Allergies  Allergen Reactions  . Ultram [Tramadol] Other (See Comments)    seizures  . Zofran [Ondansetron Hcl] Nausea And Vomiting  . Buprenorphine Hcl Hives and Rash    Shaking Tolerates Percocet, Norco, and buprenorphine  . Morphine And Related Hives, Rash and Other (See Comments)    Shaking Tolerates Percocet, Norco, Dilaudid, and buprenorphine  . Tape Rash     Significant Diagnostic Studies: Dg Chest 2 View  Result Date: 03/13/2016 CLINICAL DATA:  Sickle cell crisis pain EXAM: CHEST  2 VIEW COMPARISON:  03/07/2016 FINDINGS: A right-sided central venous catheter tip overlies the SVC. There is no acute consolidation or effusion. Stable borderline enlarged heart. No overt failure. No pneumothorax. Surgical clips in the right upper quadrant. IMPRESSION: No acute infiltrate or edema. Electronically Signed   By: Jasmine PangKim  Fujinaga M.D.   On: 03/13/2016 23:14   Dg Chest 2 View  Result Date: 03/07/2016 CLINICAL DATA:  Sickle cell pain crisis.  Shortness of breath EXAM: CHEST  2 VIEW COMPARISON:  Two days ago FINDINGS: New blunting of the right costophrenic sulcus. No consolidating airspace disease. Mild linear opacity overlapping the posterior heart on the lateral  projection is likely atelectasis. Chronic interstitial coarsening is stable. Prominent formed stool in the upper abdomen. Normal heart size and mediastinal contours. Central line on the right with tip at the upper right atrium. IMPRESSION: Trace right pleural fluid that is new from 2 days ago. No consolidating airspace disease. Electronically Signed   By: Marnee SpringJonathon  Watts M.D.   On: 03/07/2016 09:06   Dg Chest 2 View  Result Date: 03/05/2016 CLINICAL DATA:  31 year old female with chest pain. Hip and leg pain. Sickle cell crisis. Initial encounter. EXAM: CHEST  2 VIEW COMPARISON:  01/22/2016 chest radiographs and earlier. FINDINGS: Tunneled type right chest central catheter appears stable. Stable lung volumes, at the lower limits of normal. Stable mild elevation of the left hemidiaphragm. Mild cardiomegaly. Other mediastinal contours are within normal limits. Visualized tracheal air column is within normal limits. Stable pulmonary vascularity without acute edema. No pneumothorax, pleural effusion or confluent pulmonary opacity. Visualized osseous structures appear fairly unremarkable. Negative visible bowel gas pattern. IMPRESSION: No acute cardiopulmonary abnormality. Electronically Signed   By: Odessa FlemingH  Hall M.D.   On: 03/05/2016 19:15    Signed:  Jeanann LewandowskyJEGEDE, Cabela Pacifico MD, MHA, FACP, FAAP, CPE   03/31/2016, 7:11 PM

## 2016-03-31 NOTE — Progress Notes (Signed)
[facility] c/o generalized pain and rates it 9/10 on pain scale on admission. She was treated with IV fluids, and Dilaudid PCA. Pt's pain was down 5/10 at discharge. Pt was alert,oriented and ambulatory at discharge.

## 2016-04-02 ENCOUNTER — Telehealth: Payer: Self-pay | Admitting: Internal Medicine

## 2016-04-02 NOTE — Telephone Encounter (Signed)
Pt called and states that she [facility]; states that [facility] informed her that she didn't need the services because she would need the pain medications for her chronic condition; pt states that she left facility without receiving treatment

## 2016-04-07 ENCOUNTER — Telehealth: Payer: Self-pay

## 2016-04-09 ENCOUNTER — Other Ambulatory Visit: Payer: Self-pay | Admitting: Internal Medicine

## 2016-04-09 DIAGNOSIS — D5700 Hb-SS disease with crisis, unspecified: Secondary | ICD-10-CM

## 2016-04-09 MED ORDER — MORPHINE SULFATE ER 30 MG PO TBCR: 30.0000 mg | EXTENDED_RELEASE_TABLET | Freq: Two times a day (BID) | ORAL | 0 refills | Status: DC

## 2016-04-09 MED ORDER — OXYCODONE-ACETAMINOPHEN 10-325 MG PO TABS: 1.0000 | ORAL_TABLET | ORAL | 0 refills | Status: DC | PRN

## 2016-04-13 ENCOUNTER — Ambulatory Visit: Payer: Self-pay | Admitting: Family Medicine

## 2016-04-15 ENCOUNTER — Telehealth (HOSPITAL_COMMUNITY): Payer: Self-pay | Admitting: *Deleted

## 2016-04-15 NOTE — Telephone Encounter (Signed)
[facility] Pt states she is having pain in her legs, hips and back. She states her pain is 7/10. She last took her oxy and ms contin at 6am. She also has nausea. Pt consented that she could be put on hold while checking with the provider.  After checking with the provider, pt was told that she could not come in today, and she needed to make an appointment for the office. Pt was also told that if she had chest pain, vomiting, abdominal pain, she should go to the ED to be seen. Pt voiced understanding.

## 2016-04-16 ENCOUNTER — Telehealth (HOSPITAL_COMMUNITY): Payer: Self-pay | Admitting: *Deleted

## 2016-04-16 NOTE — Telephone Encounter (Signed)
[facility]. Pt states her pain is 7/10 and it is in her legs, hips and back. She last took her OxyContin and MS  this morning at 7am. She denies fever, diarrhea, or chest pain. She said that she had been nauseated and vomited last night and has upper abd pain. Pt was asked to hold on while checking with the provider.  After speaking with the provider, pt was told that she needed to make an appointment with the provider on the office side.Pt hung up the phone without commenting.

## 2016-04-19 ENCOUNTER — Emergency Department (HOSPITAL_COMMUNITY): Payer: Medicaid Other

## 2016-04-19 ENCOUNTER — Emergency Department (HOSPITAL_COMMUNITY)
Admission: EM | Admit: 2016-04-19 | Discharge: 2016-04-19 | Discharge status: Against Medical Advice | Payer: Medicaid Other | Attending: Emergency Medicine | Admitting: Emergency Medicine

## 2016-04-19 ENCOUNTER — Encounter (HOSPITAL_COMMUNITY): Payer: Self-pay | Admitting: Emergency Medicine

## 2016-04-19 DIAGNOSIS — Z79899 Other long term (current) drug therapy: Secondary | ICD-10-CM | POA: Insufficient documentation

## 2016-04-19 DIAGNOSIS — Z87891 Personal history of nicotine dependence: Secondary | ICD-10-CM | POA: Diagnosis not present

## 2016-04-19 DIAGNOSIS — G894 Chronic pain syndrome: Secondary | ICD-10-CM | POA: Diagnosis not present

## 2016-04-19 DIAGNOSIS — D5700 Hb-SS disease with crisis, unspecified: Secondary | ICD-10-CM | POA: Diagnosis present

## 2016-04-19 LAB — CBC WITH DIFFERENTIAL/PLATELET
BASOS PCT: 0 %
Basophils Absolute: 0 10*3/uL (ref 0.0–0.1)
EOS PCT: 1 %
Eosinophils Absolute: 0.1 10*3/uL (ref 0.0–0.7)
HEMATOCRIT: 24 % — AB (ref 36.0–46.0)
HEMOGLOBIN: 8.4 g/dL — AB (ref 12.0–15.0)
LYMPHS PCT: 35 %
Lymphs Abs: 4.9 10*3/uL — ABNORMAL HIGH (ref 0.7–4.0)
MCH: 34 pg (ref 26.0–34.0)
MCHC: 35 g/dL (ref 30.0–36.0)
MCV: 97.2 fL (ref 78.0–100.0)
MONOS PCT: 7 %
Monocytes Absolute: 1 10*3/uL (ref 0.1–1.0)
NEUTROS ABS: 8.1 10*3/uL — AB (ref 1.7–7.7)
Neutrophils Relative %: 57 %
Platelets: 402 10*3/uL — ABNORMAL HIGH (ref 150–400)
RBC: 2.47 MIL/uL — ABNORMAL LOW (ref 3.87–5.11)
RDW: 21.5 % — ABNORMAL HIGH (ref 11.5–15.5)
WBC: 14.1 10*3/uL — ABNORMAL HIGH (ref 4.0–10.5)

## 2016-04-19 LAB — RETICULOCYTES
RBC.: 2.45 MIL/uL — AB (ref 3.87–5.11)
Retic Count, Absolute: 389.6 10*3/uL — ABNORMAL HIGH (ref 19.0–186.0)
Retic Ct Pct: 15.9 % — ABNORMAL HIGH (ref 0.4–3.1)

## 2016-04-19 LAB — COMPREHENSIVE METABOLIC PANEL
ALBUMIN: 4.1 g/dL (ref 3.5–5.0)
ALK PHOS: 86 U/L (ref 38–126)
ALT: 34 U/L (ref 14–54)
ANION GAP: 4 — AB (ref 5–15)
AST: 49 U/L — ABNORMAL HIGH (ref 15–41)
BUN: 7 mg/dL (ref 6–20)
CALCIUM: 8.8 mg/dL — AB (ref 8.9–10.3)
CO2: 23 mmol/L (ref 22–32)
Chloride: 112 mmol/L — ABNORMAL HIGH (ref 101–111)
Creatinine, Ser: 0.43 mg/dL — ABNORMAL LOW (ref 0.44–1.00)
GFR calc Af Amer: >60 mL/min (ref >60)
GFR calc non Af Amer: >60 mL/min (ref >60)
GLUCOSE: 133 mg/dL — AB (ref 65–99)
POTASSIUM: 3 mmol/L — AB (ref 3.5–5.1)
SODIUM: 139 mmol/L (ref 135–145)
Total Bilirubin: 2.5 mg/dL — ABNORMAL HIGH (ref 0.3–1.2)
Total Protein: 7 g/dL (ref 6.5–8.1)

## 2016-04-19 MED ORDER — DEXTROSE-NACL 5-0.45 % IV SOLN
INTRAVENOUS | Status: DC
  Administered 2016-04-19: 14:00:00 via INTRAVENOUS

## 2016-04-19 MED ORDER — PROMETHAZINE HCL 25 MG/ML IJ SOLN
25.0000 mg | Freq: Once | INTRAMUSCULAR | Status: AC
  Administered 2016-04-19: 25 mg via INTRAVENOUS
  Filled 2016-04-19: qty 1

## 2016-04-19 MED ORDER — HYDROMORPHONE HCL 2 MG PO TABS
4.0000 mg | ORAL_TABLET | ORAL | Status: DC
Start: 2016-04-19 — End: 2016-04-19
  Filled 2016-04-19: qty 2

## 2016-04-19 NOTE — ED Provider Notes (Signed)
[facility]-EMERGENCY DEPT Provider Note   CSN: [id number] Arrival date & time: 04/19/16  1213     History   Chief Complaint Chief Complaint  Patient presents with  . Sickle Cell Pain Crisis    HPI Brittany Foster is a 31 y.o. female.  HPI   Pt with hx sickle cell disease, depression, chronic pain p/w pain in her legs, hips, chest, upper abdomen, with associated SOB that became bad last night.  The pain is sharp and shooting.  Woke her up this morning around 4am.  Has been taking her medications around the clock.  Associated nausea, vomited x 1.  No change in activity, no change in medications.  Does states she might be walking more recently.  Not eating or drinking as much as usual.  Denies fevers, cough, sore throat, nasal congestion, ear pain, change in bowel habits, urinary symptoms, abnormal vaginal discharge or bleeding.   PCP Dr  Hyman Hopes.   Past Medical History:  Diagnosis Date  . Anemia   . Depression, major, recurrent (HCC)   . Migraines   . Sickle cell anemia [facility])     Patient Active Problem List   Diagnosis Date Noted  . Sickle cell anemia with pain (HCC) 02/14/2016  . Hypotension 01/26/2016  . Opiate dependence (HCC) 01/23/2016  . MDD (major depressive disorder), recurrent severe, without psychosis (HCC) 01/10/2016  . Vitamin D deficiency 01/08/2015  . Chronic pain syndrome 01/08/2015  . 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 Anemia 02/09/2014  . Neuropathy (HCC) 02/09/2014    Past Surgical History:  Procedure Laterality Date  . CESAREAN SECTION    . CHOLECYSTECTOMY  2000  . IR GENERIC HISTORICAL  10/08/2015   IR US GUIDE VASC ACCESS RIGHT 10/08/2015 Brittany Come, MD [facility]INTER[facility]  . IR GENERIC HISTORICAL  10/08/2015   IR FLUORO GUIDE CV LINE RIGHT 10/08/2015 Brittany Come, MD [facility]INTER[facility]  . MULTIPLE TOOTH EXTRACTIONS N/A   . port a cath placement Right    about 6-7 years ago  . removal of porta cath Right 09/11/15  . TUBAL LIGATION      OB History    Gravida Para Term Preterm AB Living   1         1   SAB TAB Ectopic Multiple Live Births           1       Home Medications    Prior to Admission medications   Medication Sig Start Date End Date Taking? Authorizing Provider  ARIPiprazole (ABILIFY) 10 MG tablet 1 tablet daily 02/13/16  Yes Historical Provider, MD  diphenhydrAMINE (BENADRYL) 25 MG tablet Take 50 mg by mouth every 6 (six) hours as needed for itching or allergies.   Yes Historical Provider, MD  DULoxetine (CYMBALTA) 60 MG capsule TAKE 1 CAPSULE BY MOUTH DAILY. 11/27/15  Yes Quentin Angst, MD  folic acid (FOLVITE) 1 MG tablet Take 1 tablet (1 mg total) by mouth daily. 01/17/16  Yes Quentin Angst, MD  hydroxyurea (HYDREA) 500 MG capsule TAKE 2 CAPSULES BY MOUTH DAILY. MAY TAKE WITH FOOD TO MINIMIZE GI SIDE EFFECTS. Patient taking differently: Take 500 mg by mouth daily.  08/08/15  Yes [name], FNP  ibuprofen (ADVIL,MOTRIN) 200 MG tablet Take 400 mg by mouth every 6 (six) hours as needed for moderate pain.   Yes Historical Provider, MD  medroxyPROGESTERone (DEPO-PROVERA) 150 MG/ML injection Inject into the muscle.   Yes Historical Provider, MD  morphine (MS CONTIN)  30 MG 12 hr tablet Take 1 tablet (30 mg total) by mouth every 12 (twelve) hours. 04/09/16  Yes Quentin Angstlugbemiga E Jegede, MD  oxyCODONE-acetaminophen (PERCOCET) 10-325 MG tablet Take 1 tablet by mouth every 4 (four) hours as needed for pain. 04/09/16  Yes Quentin Angstlugbemiga E Jegede, MD  Topiramate ER 100 MG CP24 Take 100 mg by mouth at bedtime.   Yes Historical Provider, MD    Family History Family History  Problem Relation Age of Onset  . Sickle cell trait Father   . Sickle cell trait Mother   . Sickle cell anemia Other     Social History Social History  Substance Use Topics  . Smoking status: Former Games developermoker  . Smokeless tobacco: Never Used  . Alcohol use No     Allergies   Ultram [tramadol]; Zofran [ondansetron hcl]; Buprenorphine hcl; Morphine and  related; and Tape   Review of Systems Review of Systems  All other systems reviewed and are negative.    Physical Exam Updated Vital Signs BP 108/69   Pulse 88   Temp 98.9 F (37.2 C) (Oral)   Resp 14   Ht 5' (1.524 m)   Wt 58.1 kg   SpO2 98%   BMI 25.00 kg/m   Physical Exam  Constitutional: She appears well-developed and well-nourished. No distress.  HENT:  Head: Normocephalic and atraumatic.  Neck: Neck supple.  Cardiovascular: Normal rate, regular rhythm, normal heart sounds and intact distal pulses.   Pulmonary/Chest: Effort normal and breath sounds normal. No respiratory distress. She has no wheezes. She has no rales.  Abdominal: Soft. She exhibits no distension. There is no tenderness. There is no rebound and no guarding.  Neurological: She is alert.  Skin: She is not diaphoretic.  Nursing note and vitals reviewed.    ED Treatments / Results  Labs (all labs ordered are listed, but only abnormal results are displayed) Labs Reviewed  COMPREHENSIVE METABOLIC PANEL - Abnormal; Notable for the following:       Result Value   Potassium 3.0 (*)    Chloride 112 (*)    Glucose, Bld 133 (*)    Creatinine, Ser 0.43 (*)    Calcium 8.8 (*)    AST 49 (*)    Total Bilirubin 2.5 (*)    Anion gap 4 (*)    All other components within normal limits  CBC WITH DIFFERENTIAL/PLATELET - Abnormal; Notable for the following:    WBC 14.1 (*)    RBC 2.47 (*)    Hemoglobin 8.4 (*)    HCT 24.0 (*)    RDW 21.5 (*)    Platelets 402 (*)    Neutro Abs 8.1 (*)    Lymphs Abs 4.9 (*)    All other components within normal limits  RETICULOCYTES - Abnormal; Notable for the following:    Retic Ct Pct 15.9 (*)    RBC. 2.45 (*)    Retic Count, Manual 389.6 (*)    All other components within normal limits    EKG  EKG Interpretation None       Radiology No results found.  Procedures Procedures (including critical care time)  Medications Ordered in ED Medications  dextrose  5 %-0.45 % sodium chloride infusion ( Intravenous Stopped 04/19/16 1530)  promethazine (PHENERGAN) injection 25 mg (25 mg Intravenous Given 04/19/16 1400)     Initial Impression / Assessment and Plan / ED Course  I have reviewed the triage vital signs and the nursing notes.  Pertinent labs & imaging  results that were available during my care of the patient were reviewed by me and considered in my medical decision making (see chart for details).  Clinical Course as of Apr 19 1548  Sun Apr 19, 2016  1343 Pt not happy with care plan.  She does not want PO medication.   I discussed this with her and encouraged her to give it a chance and she has refused.  Is refusing CXR, labs, as well.    [EW]    Clinical Course User Index [EW] Trixie Dredge, PA-C   Pt with hx sickle cell disease, chronic pain, with care plan in the ED p/w sickle cell pain and CP with SOB.  I initiated the care plan for pain control while doing her workup.  Pt refused all PO medication.  I asked Dr Patria Mane to also see and speak with the patient regarding the care plan and our plans for treatment in the ED, which he did.  Please see his note for further information.  Unfortunately pt decided to not continue her evaluation and treatment in the Emergency Department and left against medical advice.    Final Clinical Impressions(s) / ED Diagnoses   Final diagnoses:  Chronic pain syndrome    New Prescriptions Discharge Medication List as of 04/19/2016  3:33 PM       Trixie Dredge, PA-C 04/19/16 1550    Azalia Bilis, MD 04/21/16 [phone]

## 2016-04-19 NOTE — ED Triage Notes (Signed)
Pt c/o sickle cell pain in legs and bilateral hips that worsened last night with SOB. Pt ambulatory, speaking in full sentences.

## 2016-04-19 NOTE — ED Notes (Signed)
PT LEFT AGAINST MEDICAL ADVICE DUE TO HER NOT BEING ABLE TO GET IV PAIN MEDICATIONS. DR. Carter KittenAMPOS AWARE.

## 2016-04-19 NOTE — ED Notes (Signed)
PT REFUSED ALL TREATMENT UNTIL SHE SPEAKS TO THE PROVIDER REGARDING HER MEDICATION REGIMEN. PA EMILY MADE AWARE.

## 2016-04-21 ENCOUNTER — Telehealth: Payer: Self-pay

## 2016-04-23 ENCOUNTER — Other Ambulatory Visit: Payer: Self-pay | Admitting: Internal Medicine

## 2016-04-23 ENCOUNTER — Telehealth: Payer: Self-pay | Admitting: Hematology

## 2016-04-23 DIAGNOSIS — D5700 Hb-SS disease with crisis, unspecified: Secondary | ICD-10-CM

## 2016-04-23 MED ORDER — OXYCODONE-ACETAMINOPHEN 10-325 MG PO TABS: 1.0000 | ORAL_TABLET | ORAL | 0 refills | Status: DC | PRN

## 2016-04-23 NOTE — Telephone Encounter (Signed)
Patient called in to check that her prescription for pain medication would be ready for pick up tomorrow.  I advised patient that I was not sure.  Right now, no prescriptions were ready.  Explained to patient that she canceled her office visit on 04/14/2016.  She just called yesterday to reschedule it.  I advised patient to keep her next appointment to ensure she is able to continue getting pain medications.  Patient became irrate, demanding to speak with Dr. Hyman HopesJegede.  Advised that he is not in the office today.  Patient stated she would be coming to the office tomorrow to "wait" for him.  I offered to notify patient if prescriptions would be available.  She refused and said she was coming into the office and Dr. Hyman HopesJegede "better give her medicine."  I attempted to explain the reasoning for office visits and prescribing pain medication and patient was not trying to hear it. She interrupted me several times, yelling. Patient admitted to being in the hospital last week and just discharged yesterday.  I then asked her where her pain medication was since she had been in patient.  Patient stated that she does not  have to explain the whereabouts of her medication to me.  Patient stated again that she would be to the office tomorrow to wait for her prescription.

## 2016-04-28 ENCOUNTER — Telehealth: Payer: Self-pay | Admitting: Hematology

## 2016-04-28 NOTE — Telephone Encounter (Signed)
Patient called in this morning C/O generalized pain that is 8/10 on pain scale. Patient denies N/V/D, chest pain or shortness of breath.  [facility].  Placed caller on hold. / Discussed with Dr. Hyman HopesJegede.  Patient was advised she could come in for treatment and to bring her pain medications in for counting and also advised that she would need to provide a urine sample upon arrival.

## 2016-05-07 ENCOUNTER — Telehealth (HOSPITAL_COMMUNITY): Payer: Self-pay | Admitting: *Deleted

## 2016-05-07 NOTE — Telephone Encounter (Signed)
Patient called requesting to come to the [facility] for treatment. Pt states her pain is 7/10 and it is in her legs, arms and back. Pt states she took her last Oxy at 8am and that she has been taking it around the clock. She denies fever, vomiting, diarrhea, or abd pain. Pt states she is a little nauseated. Will pt on hold and check with the provider, pt voiced understanding.  After checking with the provider, pt was told to stay home, hydrated and continue to take pain meds as prescribed. If her pain gets worst, go to the emergency department. Pt voiced understanding.

## 2016-05-08 ENCOUNTER — Telehealth (HOSPITAL_COMMUNITY): Payer: Self-pay | Admitting: Internal Medicine

## 2016-05-08 NOTE — Telephone Encounter (Signed)
Pt called and states experiencing pin in legs and back; states that [facility] last [facility] for related sickle cell pain, was treated, and discharged home; pt states took home dose of pain medications with no relief; NP notified

## 2016-05-08 NOTE — Telephone Encounter (Signed)
See comment section

## 2016-05-08 NOTE — Telephone Encounter (Signed)
Notified pt that, per NP, she should take home medications as prescribed, hydrate, and seek emergency treatment if symptoms of chest pain or shortness of breath occur; pt verbalizes understanding

## 2016-05-12 ENCOUNTER — Ambulatory Visit (INDEPENDENT_AMBULATORY_CARE_PROVIDER_SITE_OTHER): Payer: Medicaid Other | Admitting: Internal Medicine

## 2016-05-12 ENCOUNTER — Ambulatory Visit: Payer: Self-pay | Admitting: Internal Medicine

## 2016-05-12 VITALS — BP 109/72 | HR 96 | Temp 98.8°F | Resp 18 | Ht 60.0 in | Wt 125.0 lb

## 2016-05-12 DIAGNOSIS — D571 Sickle-cell disease without crisis: Secondary | ICD-10-CM

## 2016-05-12 DIAGNOSIS — G894 Chronic pain syndrome: Secondary | ICD-10-CM

## 2016-05-12 DIAGNOSIS — Z79891 Long term (current) use of opiate analgesic: Secondary | ICD-10-CM

## 2016-05-12 DIAGNOSIS — D5700 Hb-SS disease with crisis, unspecified: Secondary | ICD-10-CM | POA: Diagnosis not present

## 2016-05-12 MED ORDER — MORPHINE SULFATE ER 30 MG PO TBCR
30.0000 mg | EXTENDED_RELEASE_TABLET | Freq: Two times a day (BID) | ORAL | 0 refills | Status: DC
Start: 2016-05-12 — End: 2016-06-18

## 2016-05-12 MED ORDER — HYDROXYUREA 500 MG PO CAPS: ORAL_CAPSULE | ORAL | 3 refills | Status: DC

## 2016-05-12 MED ORDER — OXYCODONE-ACETAMINOPHEN 10-325 MG PO TABS: 1.0000 | ORAL_TABLET | ORAL | 0 refills | Status: DC | PRN

## 2016-05-12 NOTE — Progress Notes (Signed)
Patient is here for FU  Patient has taken medication today. Patient has eaten today.  Patient complains of bilateral hip pain being present and scaled at a 7 currently.

## 2016-05-12 NOTE — Progress Notes (Signed)
Brittany Foster, is a 31 y.o. female  ZOX:[id number]  [id number]:[id number]  DOB - 03/31/1985  Subjective:   Brittany CampbellMiranda Foster is a 31 y.o. female with history of sickle cell disease and frequent ED visits and hospital admissions here today for a hospital discharge follow up visit. Patient was discharged from a [facility] hospital this morning after few days admission for sickle cell pain crisis. Patient is complaining of 8/10 pain today, generalized and she is requesting admission to the day hospital for observation and pain management. Again, she was just discharged this morning from another hospital. She has rounded on several different healthcare system in the recent past including [facility], [facility], [facility], [facility] and [facility]. She visits the ED almost every other day, non-adherent with her home medications especially Hydroxyurea. She violated the request to bring her home pain medication for pill counting recently. She denies any fever today. No headache, No chest pain, No abdominal pain, No Nausea, No new weakness tingling or numbness, No Cough - SOB.  No problems updated.  ALLERGIES: Allergies  Allergen Reactions  . Ultram [Tramadol] Other (See Comments)    seizures  . Zofran [Ondansetron Hcl] Nausea And Vomiting  . Buprenorphine Hcl Hives and Rash    Shaking Tolerates Percocet, Norco, and buprenorphine  . Fentanyl Hives  . Morphine And Related Hives, Rash and Other (See Comments)    Shaking Tolerates Percocet, Norco, Dilaudid, and buprenorphine  . Tape Rash    PAST MEDICAL HISTORY: Past Medical History:  Diagnosis Date  . Anemia   . Depression, major, recurrent (HCC)   . Migraines   . Sickle cell anemia (HCC)     MEDICATIONS AT HOME: Prior to Admission medications   Medication Sig Start Date End Date Taking? Authorizing Provider  ARIPiprazole (ABILIFY) 10 MG tablet 1 tablet daily 02/13/16  Yes Historical Provider, MD  diphenhydrAMINE (BENADRYL) 25 MG tablet Take 50  mg by mouth every 6 (six) hours as needed for itching or allergies.   Yes Historical Provider, MD  DULoxetine (CYMBALTA) 60 MG capsule TAKE 1 CAPSULE BY MOUTH DAILY. 11/27/15  Yes Quentin Angstlugbemiga E Earlyne Feeser, MD  folic acid (FOLVITE) 1 MG tablet Take 1 tablet (1 mg total) by mouth daily. 01/17/16  Yes Quentin Angstlugbemiga E Harlie Ragle, MD  hydroxyurea (HYDREA) 500 MG capsule TAKE 2 CAPSULES BY MOUTH DAILY. MAY TAKE WITH FOOD TO MINIMIZE GI SIDE EFFECTS. 05/12/16  Yes Quentin Angstlugbemiga E Drevon Plog, MD  ibuprofen (ADVIL,MOTRIN) 200 MG tablet Take 400 mg by mouth every 6 (six) hours as needed for moderate pain.   Yes Historical Provider, MD  medroxyPROGESTERone (DEPO-PROVERA) 150 MG/ML injection Inject into the muscle.   Yes Historical Provider, MD  morphine (MS CONTIN) 30 MG 12 hr tablet Take 1 tablet (30 mg total) by mouth every 12 (twelve) hours. 05/12/16  Yes Quentin Angstlugbemiga E Glendale Youngblood, MD  oxyCODONE-acetaminophen (PERCOCET) 10-325 MG tablet Take 1 tablet by mouth every 4 (four) hours as needed for pain. 05/12/16  Yes Quentin Angstlugbemiga E Kaydn Kumpf, MD  Topiramate ER 100 MG CP24 Take 100 mg by mouth at bedtime.   Yes Historical Provider, MD    Objective:   Vitals:   05/12/16 1141  BP: 109/72  Pulse: 96  Resp: 18  Temp: 98.8 F (37.1 C)  TempSrc: Oral  SpO2: 100%  Weight: 125 lb (56.7 kg)  Height: 5' (1.524 m)   Exam General appearance : Awake, alert, not in any distress. Speech Clear. Not toxic looking HEENT: Atraumatic and Normocephalic, pupils equally reactive to light and  accomodation Neck: Supple, no JVD. No cervical lymphadenopathy.  Chest: Good air entry bilaterally, no added sounds  CVS: S1 S2 regular, no murmurs.  Abdomen: Bowel sounds present, Non tender and not distended with no gaurding, rigidity or rebound. Extremities: B/L Lower Ext shows no edema, both legs are warm to touch Neurology: Awake alert, and oriented X 3, CN II-XII intact, Non focal Skin: No Rash  Data Review No results found for: HGBA1C  Assessment &  Plan   1. Hb-SS disease with crisis (HCC) Refill - oxyCODONE-acetaminophen (PERCOCET) 10-325 MG tablet; Take 1 tablet by mouth every 4 (four) hours as needed for pain.  Dispense: 90 tablet; Refill: 0 - morphine (MS CONTIN) 30 MG 12 hr tablet; Take 1 tablet (30 mg total) by mouth every 12 (twelve) hours.  Dispense: 60 tablet; Refill: 0  2. Chronic pain syndrome  - We agreed on Opiate dose and amount of pills  per month. We discussed that pt is to receive Schedule II prescriptions only from our clinic. Pt is also aware that the prescription history is available to [place] online through the [facility] CSRS. Controlled substance agreement reviewed and signed. We reminded Ezrie that all patients receiving Schedule II narcotics must be seen for follow within one month of prescription being requested. We reviewed the terms of our pain agreement, including the need to keep medicines in a safe locked location away from children or pets, and the need to report excess sedation or constipation, measures to avoid constipation, and policies related to early refills and stolen prescriptions. According to the [place] [facility] program, we have reviewed details related to analgesia, adverse effects and aberrant behaviors.   3. Hb-SS disease (HCC)  - hydroxyurea (HYDREA) 500 MG capsule; TAKE 2 CAPSULES BY MOUTH DAILY. MAY TAKE WITH FOOD TO MINIMIZE GI SIDE EFFECTS.  Dispense: 180 capsule; Refill: 3 - Counseled extensively on medication adherence and its benefits.  - Continue Hydrea. We discussed the need for good hydration, monitoring of hydration status, avoidance of heat, cold, stress, and infection triggers. We discussed the risks and benefits of Hydrea, including bone marrow suppression, the possibility of GI upset, skin ulcers, hair thinning, and teratogenicity. The patient was reminded of the need to seek medical attention for any symptoms of bleeding, anemia, or infection. Continue folic acid 1 mg daily to  prevent aplastic bone marrow crises.  Patient have been counseled extensively about nutrition and exercise. Other issues discussed during this visit include: low cholesterol diet, weight control and daily exercise, annual eye examinations at Ophthalmology, importance of adherence with medications and regular follow-up.   Return in about 4 weeks (around 06/09/2016) for Sickle Cell Disease/Pain.  The patient was given clear instructions to go to ER or return to medical center if symptoms don't improve, worsen or new problems develop. The patient verbalized understanding. The patient was told to call to get lab results if they haven't heard anything in the next week.   This note has been created with Education officer, environmental. Any transcriptional errors are unintentional.    Jeanann Lewandowsky, MD, MHA, FACP, FAAP, CPE [facility] [place], [place] [phone]   05/12/2016, 12:19 PM

## 2016-05-12 NOTE — Patient Instructions (Signed)
Sickle Cell Anemia, Adult °Sickle cell anemia is a condition in which red blood cells have an abnormal “sickle” shape. This abnormal shape shortens the cells’ life span, which results in a lower than normal concentration of red blood cells in the blood. The sickle shape also causes the cells to clump together and block free blood flow through the blood vessels. As a result, the tissues and organs of the body do not receive enough oxygen. Sickle cell anemia causes organ damage and pain and increases the risk of infection. °What are the causes? °Sickle cell anemia is a genetic disorder. Those who receive two copies of the gene have the condition, and those who receive one copy have the trait. °What increases the risk? °The sickle cell gene is most common in people whose families originated in [place]. Other areas of the globe where sickle cell trait occurs include the Mediterranean, [place] and [place], the [place], and the [place]. °What are the signs or symptoms? °· Pain, especially in the extremities, back, chest, or abdomen (common). The pain may start suddenly or may develop following an illness, especially if there is dehydration. Pain can also occur due to overexertion or exposure to extreme temperature changes. °· Frequent severe bacterial infections, especially certain types of pneumonia and meningitis. °· Pain and swelling in the hands and feet. °· Decreased activity. °· Loss of appetite. °· Change in behavior. °· Headaches. °· Seizures. °· Shortness of breath or difficulty breathing. °· Vision changes. °· Skin ulcers. °Those with the trait may not have symptoms or they may have mild symptoms. °How is this diagnosed? °Sickle cell anemia is diagnosed with blood tests that demonstrate the genetic trait. It is often diagnosed during the newborn period, due to mandatory testing nationwide. A variety of blood tests, X-rays, CT scans, MRI scans, ultrasounds, and lung function tests may also be done to  monitor the condition. °How is this treated? °Sickle cell anemia may be treated with: °· Medicines. You may be given pain medicines, antibiotic medicines (to treat and prevent infections) or medicines to increase the production of certain types of hemoglobin. °· Fluids. °· Oxygen. °· Blood transfusions. ° °Follow these instructions at home: °· Drink enough fluid to keep your urine clear or pale yellow. Increase your fluid intake in hot weather and during exercise. °· Do not smoke. Smoking lowers oxygen levels in the blood. °· Only take over-the-counter or prescription medicines for pain, fever, or discomfort as directed by your health care provider. °· Take antibiotics as directed by your health care provider. Make sure you finish them it even if you start to feel better. °· Take supplements as directed by your health care provider. °· Consider wearing a medical alert bracelet. This tells anyone caring for you in an emergency of your condition. °· When traveling, keep your medical information, health care provider's names, and the medicines you take with you at all times. °· If you develop a fever, do not take medicines to reduce the fever right away. This could cover up a problem that is developing. Notify your health care provider. °· Keep all follow-up appointments with your health care provider. Sickle cell anemia requires regular medical care. °Contact a health care provider if: °You have a fever. °Get help right away if: °· You feel dizzy or faint. °· You have new abdominal pain, especially on the left side near the stomach area. °· You develop a persistent, often uncomfortable and painful penile erection (priapism). If this is not   treated immediately it will lead to impotence. °· You have numbness your arms or legs or you have a hard time moving them. °· You have a hard time with speech. °· You have a fever or persistent symptoms for more than 2-3 days. °· You have a fever and your symptoms suddenly get  worse. °· You have signs or symptoms of infection. These include: °? Chills. °? Abnormal tiredness (lethargy). °? Irritability. °? Poor eating. °? Vomiting. °· You develop pain that is not helped with medicine. °· You develop shortness of breath. °· You have pain in your chest. °· You are coughing up pus-like or bloody sputum. °· You develop a stiff neck. °· Your feet or hands swell or have pain. °· Your abdomen appears bloated. °· You develop joint pain. °This information is not intended to replace advice given to you by your health care provider. Make sure you discuss any questions you have with your health care provider. °Document Released: 05/27/2005 Document Revised: 09/06/2015 Document Reviewed: 09/28/2012 °Elsevier Interactive Patient Education © 2017 [employer] ° °

## 2016-05-15 ENCOUNTER — Telehealth (HOSPITAL_COMMUNITY): Payer: Self-pay | Admitting: *Deleted

## 2016-05-15 NOTE — Telephone Encounter (Signed)
Pt called [facility]. Pt states her pain is in her legs, hips, and back. Pt states her pain is 7/10 and that she took her last pain med at 6am of Oxy and MS. Will check with the provider while pt holds. Pt voiced understanding.  After speaking with the provider, pt was told that she needed to cont taking her pain meds as prescribe, hydrate and rest.

## 2016-05-24 ENCOUNTER — Emergency Department
Admission: EM | Admit: 2016-05-24 | Discharge: 2016-05-24 | Disposition: A | Discharge status: Other Acute Inpatient Hospital | Payer: Medicaid Other | Attending: Emergency Medicine | Admitting: Emergency Medicine

## 2016-05-24 ENCOUNTER — Emergency Department: Payer: Medicaid Other

## 2016-05-24 ENCOUNTER — Inpatient Hospital Stay (HOSPITAL_COMMUNITY)
Admission: AD | Admit: 2016-05-24 | Discharge: 2016-05-30 | DRG: 812 | Disposition: A | Discharge status: Home / Self Care | Payer: Medicaid Other | Source: Other Acute Inpatient Hospital | Attending: Internal Medicine | Admitting: Internal Medicine

## 2016-05-24 ENCOUNTER — Encounter: Payer: Self-pay | Admitting: *Deleted

## 2016-05-24 DIAGNOSIS — F1120 Opioid dependence, uncomplicated: Secondary | ICD-10-CM | POA: Diagnosis present

## 2016-05-24 DIAGNOSIS — Z79899 Other long term (current) drug therapy: Secondary | ICD-10-CM | POA: Diagnosis not present

## 2016-05-24 DIAGNOSIS — F609 Personality disorder, unspecified: Secondary | ICD-10-CM | POA: Diagnosis not present

## 2016-05-24 DIAGNOSIS — R42 Dizziness and giddiness: Secondary | ICD-10-CM | POA: Diagnosis not present

## 2016-05-24 DIAGNOSIS — Z888 Allergy status to other drugs, medicaments and biological substances status: Secondary | ICD-10-CM

## 2016-05-24 DIAGNOSIS — D72829 Elevated white blood cell count, unspecified: Secondary | ICD-10-CM | POA: Diagnosis present

## 2016-05-24 DIAGNOSIS — Z79891 Long term (current) use of opiate analgesic: Secondary | ICD-10-CM | POA: Diagnosis not present

## 2016-05-24 DIAGNOSIS — M545 Low back pain: Secondary | ICD-10-CM | POA: Diagnosis present

## 2016-05-24 DIAGNOSIS — Z8669 Personal history of other diseases of the nervous system and sense organs: Secondary | ICD-10-CM

## 2016-05-24 DIAGNOSIS — Z87891 Personal history of nicotine dependence: Secondary | ICD-10-CM | POA: Insufficient documentation

## 2016-05-24 DIAGNOSIS — D5700 Hb-SS disease with crisis, unspecified: Secondary | ICD-10-CM | POA: Diagnosis present

## 2016-05-24 DIAGNOSIS — D638 Anemia in other chronic diseases classified elsewhere: Secondary | ICD-10-CM | POA: Diagnosis present

## 2016-05-24 DIAGNOSIS — G8929 Other chronic pain: Secondary | ICD-10-CM | POA: Diagnosis present

## 2016-05-24 DIAGNOSIS — Z793 Long term (current) use of hormonal contraceptives: Secondary | ICD-10-CM | POA: Diagnosis not present

## 2016-05-24 DIAGNOSIS — F339 Major depressive disorder, recurrent, unspecified: Secondary | ICD-10-CM | POA: Diagnosis present

## 2016-05-24 DIAGNOSIS — Z885 Allergy status to narcotic agent status: Secondary | ICD-10-CM | POA: Diagnosis not present

## 2016-05-24 DIAGNOSIS — G894 Chronic pain syndrome: Secondary | ICD-10-CM | POA: Diagnosis not present

## 2016-05-24 DIAGNOSIS — Z91048 Other nonmedicinal substance allergy status: Secondary | ICD-10-CM

## 2016-05-24 LAB — URINALYSIS, COMPLETE (UACMP) WITH MICROSCOPIC
BILIRUBIN URINE: NEGATIVE
Bacteria, UA: NONE SEEN
Glucose, UA: NEGATIVE mg/dL
KETONES UR: NEGATIVE mg/dL
NITRITE: NEGATIVE
Protein, ur: NEGATIVE mg/dL
SPECIFIC GRAVITY, URINE: 1.012 (ref 1.005–1.030)
pH: 6 (ref 5.0–8.0)

## 2016-05-24 LAB — CBC WITH DIFFERENTIAL/PLATELET
BASOS ABS: 0.1 10*3/uL (ref 0–0.1)
Basophils Relative: 1 %
EOS ABS: 0.1 10*3/uL (ref 0–0.7)
Eosinophils Relative: 1 %
HEMATOCRIT: 27.5 % — AB (ref 35.0–47.0)
Hemoglobin: 9.4 g/dL — ABNORMAL LOW (ref 12.0–16.0)
LYMPHS ABS: 4 10*3/uL — AB (ref 1.0–3.6)
LYMPHS PCT: 29 %
MCH: 32.9 pg (ref 26.0–34.0)
MCHC: 34.2 g/dL (ref 32.0–36.0)
MCV: 96.3 fL (ref 80.0–100.0)
Monocytes Absolute: 1.1 10*3/uL — ABNORMAL HIGH (ref 0.2–0.9)
Monocytes Relative: 8 %
NEUTROS ABS: 8.5 10*3/uL — AB (ref 1.4–6.5)
Neutrophils Relative %: 61 %
PLATELETS: 319 10*3/uL (ref 150–440)
RBC: 2.86 MIL/uL — AB (ref 3.80–5.20)
RDW: 19.2 % — AB (ref 11.5–14.5)
WBC: 13.8 10*3/uL — AB (ref 3.6–11.0)

## 2016-05-24 LAB — RETICULOCYTES
RBC.: 2.87 MIL/uL — AB (ref 3.80–5.20)
RETIC CT PCT: 4.3 % — AB (ref 0.4–3.1)
Retic Count, Absolute: 123.4 10*3/uL (ref 19.0–183.0)

## 2016-05-24 LAB — BASIC METABOLIC PANEL
ANION GAP: 5 (ref 5–15)
BUN: 11 mg/dL (ref 6–20)
CALCIUM: 8.9 mg/dL (ref 8.9–10.3)
CO2: 22 mmol/L (ref 22–32)
Chloride: 112 mmol/L — ABNORMAL HIGH (ref 101–111)
Creatinine, Ser: 0.39 mg/dL — ABNORMAL LOW (ref 0.44–1.00)
Glucose, Bld: 135 mg/dL — ABNORMAL HIGH (ref 65–99)
Potassium: 3.6 mmol/L (ref 3.5–5.1)
Sodium: 139 mmol/L (ref 135–145)

## 2016-05-24 LAB — PREGNANCY, URINE: Preg Test, Ur: NEGATIVE

## 2016-05-24 MED ORDER — HYDROMORPHONE HCL 2 MG PO TABS
4.0000 mg | ORAL_TABLET | ORAL | Status: AC | PRN
  Administered 2016-05-24 (×2): 4 mg via ORAL
  Filled 2016-05-24 (×2): qty 2

## 2016-05-24 MED ORDER — POLYETHYLENE GLYCOL 3350 17 G PO PACK: 17.0000 g | PACK | Freq: Every day | ORAL | Status: DC | PRN

## 2016-05-24 MED ORDER — HYDROMORPHONE HCL 1 MG/ML IJ SOLN
2.0000 mg | INTRAMUSCULAR | Status: AC
  Administered 2016-05-24: 2 mg via INTRAVENOUS
  Filled 2016-05-24: qty 2

## 2016-05-24 MED ORDER — DULOXETINE HCL 30 MG PO CPEP
60.0000 mg | ORAL_CAPSULE | Freq: Every day | ORAL | Status: DC
  Administered 2016-05-25 – 2016-05-30 (×6): 60 mg via ORAL
  Filled 2016-05-24 (×6): qty 2

## 2016-05-24 MED ORDER — SODIUM CHLORIDE 0.9% FLUSH: 9.0000 mL | INTRAVENOUS | Status: DC | PRN

## 2016-05-24 MED ORDER — PROMETHAZINE HCL 25 MG PO TABS
25.0000 mg | ORAL_TABLET | Freq: Once | ORAL | Status: AC
  Administered 2016-05-24: 25 mg via ORAL
  Filled 2016-05-24: qty 1

## 2016-05-24 MED ORDER — HYDROMORPHONE HCL 1 MG/ML IJ SOLN
2.0000 mg | Freq: Once | INTRAMUSCULAR | Status: AC
  Administered 2016-05-24: 2 mg via INTRAVENOUS
  Filled 2016-05-24: qty 2

## 2016-05-24 MED ORDER — DEXTROSE-NACL 5-0.45 % IV SOLN
INTRAVENOUS | Status: DC
  Administered 2016-05-24 – 2016-05-25 (×2): via INTRAVENOUS

## 2016-05-24 MED ORDER — NALOXONE HCL 0.4 MG/ML IJ SOLN: 0.4000 mg | INTRAMUSCULAR | Status: DC | PRN

## 2016-05-24 MED ORDER — HYDROMORPHONE 1 MG/ML IV SOLN
INTRAVENOUS | Status: DC
  Administered 2016-05-24: 1 mg via INTRAVENOUS
  Administered 2016-05-25: 5.5 mg via INTRAVENOUS
  Administered 2016-05-25: 6 mg via INTRAVENOUS
  Administered 2016-05-25: 9 mg via INTRAVENOUS
  Administered 2016-05-25: 7 mg via INTRAVENOUS
  Administered 2016-05-25: 13:00:00 via INTRAVENOUS
  Filled 2016-05-24 (×2): qty 25

## 2016-05-24 MED ORDER — KETOROLAC TROMETHAMINE 30 MG/ML IJ SOLN
30.0000 mg | Freq: Once | INTRAMUSCULAR | Status: AC
  Administered 2016-05-24: 30 mg via INTRAVENOUS
  Filled 2016-05-24 (×2): qty 1

## 2016-05-24 MED ORDER — HYDROXYUREA 500 MG PO CAPS
500.0000 mg | ORAL_CAPSULE | Freq: Every day | ORAL | Status: DC
  Administered 2016-05-25 – 2016-05-30 (×6): 500 mg via ORAL
  Filled 2016-05-24 (×6): qty 1

## 2016-05-24 MED ORDER — DIPHENHYDRAMINE HCL 25 MG PO CAPS
25.0000 mg | ORAL_CAPSULE | Freq: Once | ORAL | Status: AC
  Administered 2016-05-24: 25 mg via ORAL
  Filled 2016-05-24: qty 1

## 2016-05-24 MED ORDER — SENNOSIDES-DOCUSATE SODIUM 8.6-50 MG PO TABS
1.0000 | ORAL_TABLET | Freq: Two times a day (BID) | ORAL | Status: DC
  Administered 2016-05-26: 1 via ORAL
  Filled 2016-05-24 (×9): qty 1

## 2016-05-24 MED ORDER — ARIPIPRAZOLE 10 MG PO TABS
10.0000 mg | ORAL_TABLET | Freq: Every day | ORAL | Status: DC
  Administered 2016-05-25 – 2016-05-30 (×6): 10 mg via ORAL
  Filled 2016-05-24 (×7): qty 1

## 2016-05-24 MED ORDER — ONDANSETRON HCL 4 MG/2ML IJ SOLN: 4.0000 mg | Freq: Four times a day (QID) | INTRAMUSCULAR | Status: DC | PRN

## 2016-05-24 MED ORDER — SODIUM CHLORIDE 0.9 % IV SOLN
25.0000 mg | INTRAVENOUS | Status: DC | PRN
  Administered 2016-05-29 – 2016-05-30 (×2): 25 mg via INTRAVENOUS
  Filled 2016-05-24 (×6): qty 0.5

## 2016-05-24 MED ORDER — MORPHINE SULFATE ER 15 MG PO TBCR
30.0000 mg | EXTENDED_RELEASE_TABLET | Freq: Two times a day (BID) | ORAL | Status: DC
  Administered 2016-05-24: 30 mg via ORAL
  Filled 2016-05-24: qty 2

## 2016-05-24 MED ORDER — PROMETHAZINE HCL 25 MG PO TABS
12.5000 mg | ORAL_TABLET | Freq: Once | ORAL | Status: DC
  Filled 2016-05-24: qty 1

## 2016-05-24 MED ORDER — FOLIC ACID 1 MG PO TABS
1.0000 mg | ORAL_TABLET | Freq: Every day | ORAL | Status: DC
  Administered 2016-05-25 – 2016-05-30 (×6): 1 mg via ORAL
  Filled 2016-05-24 (×6): qty 1

## 2016-05-24 MED ORDER — MORPHINE SULFATE ER 30 MG PO TBCR
30.0000 mg | EXTENDED_RELEASE_TABLET | Freq: Two times a day (BID) | ORAL | Status: DC
  Administered 2016-05-24 – 2016-05-30 (×12): 30 mg via ORAL
  Filled 2016-05-24 (×12): qty 1

## 2016-05-24 MED ORDER — DIPHENHYDRAMINE HCL 25 MG PO CAPS
25.0000 mg | ORAL_CAPSULE | ORAL | Status: DC | PRN
  Administered 2016-05-25 – 2016-05-28 (×6): 25 mg via ORAL
  Filled 2016-05-24 (×8): qty 1

## 2016-05-24 MED ORDER — SODIUM CHLORIDE 0.9 % IV BOLUS (SEPSIS)
1000.0000 mL | Freq: Once | INTRAVENOUS | Status: AC
  Administered 2016-05-24: 1000 mL via INTRAVENOUS

## 2016-05-24 MED ORDER — ENOXAPARIN SODIUM 40 MG/0.4ML ~~LOC~~ SOLN
40.0000 mg | Freq: Every day | SUBCUTANEOUS | Status: DC
  Filled 2016-05-24 (×2): qty 0.4

## 2016-05-24 MED ORDER — KETOROLAC TROMETHAMINE 30 MG/ML IJ SOLN
30.0000 mg | Freq: Four times a day (QID) | INTRAMUSCULAR | Status: AC
  Administered 2016-05-24 – 2016-05-29 (×19): 30 mg via INTRAVENOUS
  Filled 2016-05-24 (×19): qty 1

## 2016-05-24 MED ORDER — PROMETHAZINE HCL 25 MG PO TABS
12.5000 mg | ORAL_TABLET | Freq: Once | ORAL | Status: AC
  Administered 2016-05-24: 12.5 mg via ORAL

## 2016-05-24 NOTE — ED Triage Notes (Signed)
Pt has sickle cell, pt complains of generalized body pain,pt denies any other symptoms

## 2016-05-24 NOTE — Progress Notes (Signed)
Pt arrived from [facility]. Pt is A&Ox4. Pain is generalized to hips and chest. Was [facility]. Pt has right chest CL with dressing dry and intact. Vitals stable. Pt oriented to room, call bell, bed use, etc. Pt verbalized understanding. Advised that on call MD will be paged for orders and pt verb understanding. Will continue to monitor.

## 2016-05-24 NOTE — ED Notes (Signed)
Dr. Scotty CourtStafford in to reassess.

## 2016-05-24 NOTE — ED Notes (Signed)
Pt out to desk asking if MD has called [facility] yet for her; spoke with MD who was under the impression pt did not want to be transferred; pt say she is in pain and would like to be transferred; MD informed of same and will begin process for transfer;

## 2016-05-24 NOTE — ED Notes (Signed)
Visitor of pt out to desk wondering how long until pt is transferred; explained the process to visitor and pt in room, MD has to speak with facility regarding possible transfer and then the receiving facility has to accept pt; verbalized understanding; informed pt that MD is currently on the phone discussing her transfer and will notify her when/if she is accepted;

## 2016-05-24 NOTE — H&P (Signed)
History and Physical    Brittany Foster ZOX:[id number] DOB: 27-Apr-1985 DOA: 05/24/2016  PCP: [name] Patient  Patient coming from: Home, [facility] transfer  I have personally briefly reviewed patient's old medical records in [facility] Link  Chief Complaint: Sickle cell pain crisis  HPI: Brittany Foster is a 31 y.o. female with medical history significant of sickle cell anemia.  Patient presents to the ED at [facility] with c/o sickle cell pain crisis.  Symptoms onset a couple of days ago, persistent, severe.  Located in low back and B legs and B arms.  Has had nausea but no vomiting.  No CP no SOB.   ED Course: Given PO then IV narcotics in ED, pain persists so patient was transferred to [facility].   Review of Systems: As per HPI otherwise 10 point review of systems negative.   Past Medical History:  Diagnosis Date  . Anemia   . Depression, major, recurrent (HCC)   . Migraines   . Sickle cell anemia (HCC)     Past Surgical History:  Procedure Laterality Date  . CESAREAN SECTION    . CHOLECYSTECTOMY  2000  . IR GENERIC HISTORICAL  10/08/2015   IR US GUIDE VASC ACCESS RIGHT 10/08/2015 Simonne Come, MD [facility]-INTERV RAD  . IR GENERIC HISTORICAL  10/08/2015   IR FLUORO GUIDE CV LINE RIGHT 10/08/2015 Simonne Come, MD [facility]-INTERV RAD  . MULTIPLE TOOTH EXTRACTIONS N/A   . port a cath placement Right    about 6-7 years ago  . removal of porta cath Right 09/11/15  . TUBAL LIGATION       reports that she has quit smoking. She has never used smokeless tobacco. She reports that she does not drink alcohol or use drugs.  Allergies  Allergen Reactions  . Ultram [Tramadol] Other (See Comments)    seizures  . Zofran [Ondansetron Hcl] Nausea And Vomiting  . Buprenorphine Hcl Hives and Rash    Shaking Tolerates Percocet, Norco, and buprenorphine  . Fentanyl Hives  . Morphine And Related Hives, Rash and Other (See Comments)    Shaking Tolerates Percocet, Norco, Dilaudid, and buprenorphine  . Tape Rash    Family  History  Problem Relation Age of Onset  . Sickle cell trait Father   . Sickle cell trait Mother   . Sickle cell anemia Other      Prior to Admission medications   Medication Sig Start Date End Date Taking? Authorizing Provider  ARIPiprazole (ABILIFY) 10 MG tablet Take 10 mg by mouth daily 02/13/16  Yes Historical Provider, MD  diphenhydrAMINE (BENADRYL) 25 MG tablet Take 50 mg by mouth every 6 (six) hours as needed for itching or allergies.   Yes Historical Provider, MD  DULoxetine (CYMBALTA) 60 MG capsule TAKE 1 CAPSULE BY MOUTH DAILY. Patient taking differently: TAKE 60 MG BY MOUTH DAILY 11/27/15  Yes Quentin Angst, MD  folic acid (FOLVITE) 1 MG tablet Take 1 tablet (1 mg total) by mouth daily. 01/17/16  Yes Quentin Angst, MD  hydroxyurea (HYDREA) 500 MG capsule TAKE 2 CAPSULES BY MOUTH DAILY. MAY TAKE WITH FOOD TO MINIMIZE GI SIDE EFFECTS. Patient taking differently: Take 500 mg by mouth daily.  05/12/16  Yes Quentin Angst, MD  ibuprofen (ADVIL,MOTRIN) 200 MG tablet Take 400 mg by mouth every 6 (six) hours as needed for moderate pain.   Yes Historical Provider, MD  medroxyPROGESTERone (DEPO-PROVERA) 150 MG/ML injection Inject 150 mg into the muscle every 3 (three) months.  Yes Historical Provider, MD  morphine (MS CONTIN) 30 MG 12 hr tablet Take 1 tablet (30 mg total) by mouth every 12 (twelve) hours. 05/12/16  Yes Quentin Angst, MD  oxyCODONE-acetaminophen (PERCOCET) 10-325 MG tablet Take 1 tablet by mouth every 4 (four) hours as needed for pain. 05/12/16  Yes Quentin Angst, MD  Topiramate ER 100 MG CP24 Take 100 mg by mouth at bedtime.   Yes Historical Provider, MD    Physical Exam: There were no vitals filed for this visit.  Constitutional: NAD, calm, comfortable Eyes: PERRL, lids and conjunctivae normal ENMT: Mucous membranes are moist. Posterior pharynx clear of any exudate or lesions.Normal dentition.  Neck: normal, supple, no masses, no  thyromegaly Respiratory: clear to auscultation bilaterally, no wheezing, no crackles. Normal respiratory effort. No accessory muscle use.  Cardiovascular: Regular rate and rhythm, no murmurs / rubs / gallops. No extremity edema. 2+ pedal pulses. No carotid bruits.  Abdomen: no tenderness, no masses palpated. No hepatosplenomegaly. Bowel sounds positive.  Musculoskeletal: no clubbing / cyanosis. No joint deformity upper and lower extremities. Good ROM, no contractures. Normal muscle tone.  Skin: no rashes, lesions, ulcers. No induration Neurologic: CN 2-12 grossly intact. Sensation intact, DTR normal. Strength 5/5 in all 4.  Psychiatric: Normal judgment and insight. Alert and oriented x 3. Normal mood.    Labs on Admission: I have personally reviewed following labs and imaging studies  CBC:  Recent Labs Lab 05/24/16 1141  WBC 13.8*  NEUTROABS 8.5*  HGB 9.4*  HCT 27.5*  MCV 96.3  PLT 319   Basic Metabolic Panel:  Recent Labs Lab 05/24/16 1141  NA 139  K 3.6  CL 112*  CO2 22  GLUCOSE 135*  BUN 11  CREATININE 0.39*  CALCIUM 8.9   GFR: Estimated Creatinine Clearance: 80.4 mL/min (A) (by C-G formula based on SCr of 0.39 mg/dL (L)). Liver Function Tests: No results for input(s): AST, ALT, ALKPHOS, BILITOT, PROT, ALBUMIN in the last 168 hours. No results for input(s): LIPASE, AMYLASE in the last 168 hours. No results for input(s): AMMONIA in the last 168 hours. Coagulation Profile: No results for input(s): INR, PROTIME in the last 168 hours. Cardiac Enzymes: No results for input(s): CKTOTAL, CKMB, CKMBINDEX, TROPONINI in the last 168 hours. BNP (last 3 results) No results for input(s): PROBNP in the last 8760 hours. HbA1C: No results for input(s): HGBA1C in the last 72 hours. CBG: No results for input(s): GLUCAP in the last 168 hours. Lipid Profile: No results for input(s): CHOL, HDL, LDLCALC, TRIG, CHOLHDL, LDLDIRECT in the last 72 hours. Thyroid Function Tests: No  results for input(s): TSH, T4TOTAL, FREET4, T3FREE, THYROIDAB in the last 72 hours. Anemia Panel:  Recent Labs  05/24/16 1141  RETICCTPCT 4.3*   Urine analysis:    Component Value Date/Time   COLORURINE YELLOW (A) 05/24/2016 1249   APPEARANCEUR CLEAR (A) 05/24/2016 1249   LABSPEC 1.012 05/24/2016 1249   PHURINE 6.0 05/24/2016 1249   GLUCOSEU NEGATIVE 05/24/2016 1249   HGBUR SMALL (A) 05/24/2016 1249   BILIRUBINUR NEGATIVE 05/24/2016 1249   KETONESUR NEGATIVE 05/24/2016 1249   PROTEINUR NEGATIVE 05/24/2016 1249   UROBILINOGEN 1.0 11/13/2014 1338   NITRITE NEGATIVE 05/24/2016 1249   LEUKOCYTESUR MODERATE (A) 05/24/2016 1249    Radiological Exams on Admission: No results found.  EKG: Independently reviewed.  Assessment/Plan Active Problems:   Sickle cell crisis (HCC)    1. Sickle cell pain crisis - 1. Sickle cell pathway 2. Continue hydrea 3. Scheduled  toradol 4. Continue ms contin 5. Dilaudid PCA per prior dosing 6. I see that there have been opiate dependence concerns with this patient in the recent past.  However I see that she is still seeing Dr. Keane Scrapelugbemiga as of 12 days ago. 7. Sickle cell team to see in AM  DVT prophylaxis: Lovenox Code Status: Full Family Communication: No family in room Disposition Plan: Home after admit Consults called: None Admission status: Admit to inpatient for PCA   Hillary BowGARDNER, JARED M. DO [facility] Pager [phone]  If 7AM-7PM, please contact day team taking care of patient [email or web address] Password [email or web address]  05/24/2016, 11:01 PM

## 2016-05-24 NOTE — Progress Notes (Signed)
Callback from flow manager that MD will be advised of arrival of pt and Dr. Julian ReilGardner will be seeing pt.

## 2016-05-24 NOTE — ED Notes (Signed)
[facility] here to transport pt. Pt ambultory to care link strecher.

## 2016-05-24 NOTE — ED Notes (Signed)
Pt stating that "no one has been in to check on me". RN stated that this RN  As well as other RN's and ED staff have all been into the room when she has called out.

## 2016-05-24 NOTE — ED Notes (Signed)
md declines to order additional pain medication at this time. Pt updated on md notification and response. Pt provided with coke and declines offer for meal tray at this time.

## 2016-05-24 NOTE — ED Provider Notes (Signed)
 -----------------------------------------   7:25 PM on 05/24/2016 -----------------------------------------  After the patient had had her oral Dilaudid per the care plan after MS Contin, as well as oral Phenergan, patient reports only minimal improvement of her pain to 7 out of 10 from a baseline of 8 out of 10. He is energetic with unremarkable vital signs except for mild tachycardia. She is eating and drinking and ambulatory.  She was therefore given 2 mg IV Dilaudid. She reports no pain relief with this. We will need to hospitalize her due to her intractable pain. Due to [facility] policy, the patient will have to be [facility] for care of her sickle cell disease related issue. I have asked for the [facility] transfer center to arrange transfer.    ----------------------------------------- 8:08 PM on 05/24/2016 -----------------------------------------  Discussed with [facility] hospitalist Dr. Marthenia RollingHugo who accepts for transfer for further management of sickle cell pain crisis.   Sharman CheekPhillip Krishna Heuer, MD 05/24/16 2008

## 2016-05-24 NOTE — ED Notes (Signed)
Pt continues to refuse x-ray and flu swab. Orders cancelled by this RN.

## 2016-05-24 NOTE — ED Notes (Signed)
Pt refusing Flu Panel, Chest X-ray and Toradol at this time. MD Don PerkingVeronese explained that we must follow care plan.

## 2016-05-24 NOTE — ED Notes (Signed)
Pt updated on progression of transfer. Pt verbalizes understanding. Pt has removed blood pressure cuff and monitoring equipment again. Pt requesting pain medication. Will notify md.

## 2016-05-24 NOTE — Progress Notes (Signed)
Flow manager paged to alert that pt has arrived to floor.

## 2016-05-24 NOTE — ED Provider Notes (Signed)
[facility] Emergency Department Provider Note  ____________________________________________  Time seen: Approximately 12:46 PM  I have reviewed the triage vital signs and the nursing notes.   HISTORY  Chief Complaint Generalized Body Aches   HPI Brittany Foster is a 31 y.o. female with history of sickle cell disease and opiate dependence who presents for evaluation of sickle cell pain crisis. Patient reports a few days of low back pain, bilateral leg pain, bilateral arm pain consistent with sickle cell pain crisis. She has had nausea but no vomiting, no fever or chills, no chest pain or shortness of breath, no cough or congestion, no dysuria or hematuria. Patient was seen yesterday at [facility] in health for similar complaints and left the emergency department after being decline opiates. Patient reports his been taking MS Contin and Percocet at home with no improvement of her pain. Her pain is currently 7 out of 10.  Past Medical History:  Diagnosis Date  . Anemia   . Depression, major, recurrent (HCC)   . Migraines   . Sickle cell anemia [facility])     Patient Active Problem List   Diagnosis Date Noted  . Sickle cell anemia with pain (HCC) 02/14/2016  . Hypotension 01/26/2016  . Opiate dependence (HCC) 01/23/2016  . MDD (major depressive disorder), recurrent severe, without psychosis (HCC) 01/10/2016  . Vitamin D deficiency 01/08/2015  . Chronic pain syndrome 01/08/2015  . 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 Anemia 02/09/2014  . Neuropathy (HCC) 02/09/2014    Past Surgical History:  Procedure Laterality Date  . CESAREAN SECTION    . CHOLECYSTECTOMY  2000  . IR GENERIC HISTORICAL  10/08/2015   IR US GUIDE VASC ACCESS RIGHT 10/08/2015 Simonne Come, MD [facility]-INTERV RAD  . IR GENERIC HISTORICAL  10/08/2015   IR FLUORO GUIDE CV LINE RIGHT 10/08/2015 Simonne Come, MD [facility]INTER[facility]  . MULTIPLE TOOTH EXTRACTIONS N/A   . port a cath placement Right    about 6-7  years ago  . removal of porta cath Right 09/11/15  . TUBAL LIGATION      Prior to Admission medications   Medication Sig Start Date End Date Taking? Authorizing Provider  ARIPiprazole (ABILIFY) 10 MG tablet 1 tablet daily 02/13/16   Historical Provider, MD  diphenhydrAMINE (BENADRYL) 25 MG tablet Take 50 mg by mouth every 6 (six) hours as needed for itching or allergies.    Historical Provider, MD  DULoxetine (CYMBALTA) 60 MG capsule TAKE 1 CAPSULE BY MOUTH DAILY. 11/27/15   Quentin Angst, MD  folic acid (FOLVITE) 1 MG tablet Take 1 tablet (1 mg total) by mouth daily. 01/17/16   Quentin Angst, MD  hydroxyurea (HYDREA) 500 MG capsule TAKE 2 CAPSULES BY MOUTH DAILY. MAY TAKE WITH FOOD TO MINIMIZE GI SIDE EFFECTS. 05/12/16   Quentin Angst, MD  ibuprofen (ADVIL,MOTRIN) 200 MG tablet Take 400 mg by mouth every 6 (six) hours as needed for moderate pain.    Historical Provider, MD  medroxyPROGESTERone (DEPO-PROVERA) 150 MG/ML injection Inject into the muscle.    Historical Provider, MD  morphine (MS CONTIN) 30 MG 12 hr tablet Take 1 tablet (30 mg total) by mouth every 12 (twelve) hours. 05/12/16   Quentin Angst, MD  oxyCODONE-acetaminophen (PERCOCET) 10-325 MG tablet Take 1 tablet by mouth every 4 (four) hours as needed for pain. 05/12/16   Quentin Angst, MD  Topiramate ER 100 MG CP24 Take 100 mg by mouth at bedtime.  Historical Provider, MD    Allergies Ultram [tramadol]; Zofran [ondansetron hcl]; Buprenorphine hcl; Fentanyl; Morphine and related; and Tape  Family History  Problem Relation Age of Onset  . Sickle cell trait Father   . Sickle cell trait Mother   . Sickle cell anemia Other     Social History Social History  Substance Use Topics  . Smoking status: Former Games developermoker  . Smokeless tobacco: Never Used  . Alcohol use No    Review of Systems  Constitutional: Negative for fever. Eyes: Negative for visual changes. ENT: Negative for sore throat. Neck:  No neck pain  Cardiovascular: Negative for chest pain. Respiratory: Negative for shortness of breath. Gastrointestinal: Negative for abdominal pain, vomiting or diarrhea. Genitourinary: Negative for dysuria. Musculoskeletal: +Back pain, bilateral arm pain, bilateral leg pain Skin: Negative for rash. Neurological: Negative for headaches, weakness or numbness. Psych: No SI or HI  ____________________________________________   PHYSICAL EXAM:  VITAL SIGNS: ED Triage Vitals [05/24/16 1125]  Enc Vitals Group     BP 113/71     Pulse Rate 99     Resp 20     Temp 98.7 F (37.1 C)     Temp Source Oral     SpO2 100 %     Weight 125 lb (56.7 kg)     Height 5' (1.524 m)     Head Circumference      Peak Flow      Pain Score 8     Pain Loc      Pain Edu?      Excl. in GC?     Constitutional: Alert and oriented. Well appearing and in no apparent distress. HEENT:      Head: Normocephalic and atraumatic.         Eyes: Conjunctivae are normal. Sclera is non-icteric. EOMI. PERRL      Mouth/Throat: Mucous membranes are moist.       Neck: Supple with no signs of meningismus. Cardiovascular: Regular rate and rhythm. No murmurs, gallops, or rubs. 2+ symmetrical distal pulses are present in all extremities. No JVD. Respiratory: Normal respiratory effort. Lungs are clear to auscultation bilaterally. No wheezes, crackles, or rhonchi.  Gastrointestinal: Soft, non tender, and non distended with positive bowel sounds. No rebound or guarding. Genitourinary: No CVA tenderness. Musculoskeletal: Nontender with normal range of motion in all extremities. No edema, cyanosis, or erythema of extremities. Neurologic: Normal speech and language. Face is symmetric. Moving all extremities. No gross focal neurologic deficits are appreciated. Skin: Skin is warm, dry and intact. No rash noted. Psychiatric: Mood and affect are normal. Speech and behavior are normal.  ____________________________________________    LABS (all labs ordered are listed, but only abnormal results are displayed)  Labs Reviewed  CBC WITH DIFFERENTIAL/PLATELET - Abnormal; Notable for the following:       Result Value   WBC 13.8 (*)    RBC 2.86 (*)    Hemoglobin 9.4 (*)    HCT 27.5 (*)    RDW 19.2 (*)    Neutro Abs 8.5 (*)    Lymphs Abs 4.0 (*)    Monocytes Absolute 1.1 (*)    All other components within normal limits  BASIC METABOLIC PANEL - Abnormal; Notable for the following:    Chloride 112 (*)    Glucose, Bld 135 (*)    Creatinine, Ser 0.39 (*)    All other components within normal limits  URINALYSIS, COMPLETE (UACMP) WITH MICROSCOPIC - Abnormal; Notable for the following:  Color, Urine YELLOW (*)    APPearance CLEAR (*)    Hgb urine dipstick SMALL (*)    Leukocytes, UA MODERATE (*)    Squamous Epithelial / LPF 0-5 (*)    All other components within normal limits  RETICULOCYTES - Abnormal; Notable for the following:    Retic Ct Pct 4.3 (*)    RBC. 2.87 (*)    All other components within normal limits  URINE CULTURE  PREGNANCY, URINE   ____________________________________________  EKG  none ____________________________________________  RADIOLOGY  CXR: patient refused  ____________________________________________   PROCEDURES  Procedure(s) performed: None Procedures Critical Care performed:  None ____________________________________________   INITIAL IMPRESSION / ASSESSMENT AND PLAN / ED COURSE  31 y.o. female with history of sickle cell disease and opiate dependence who presents for evaluation of sickle cell pain crisis. Patient has instituted plan on Epic for pain crisis which will be followed. Patient very aggressive towards staff and myself upon entering the room, demanding that we call her PCP to discuss her pain plan and demanding IV opioids. Patient is refusing flu, CXR, IVF, IV toradol. Only accepting narcotics. I explained the importance of having all these medications to help  treat her pain.  Vitals are WNL. Labs chronic mild leukocytosis, hemoglobin of 9.4 which is the highest it ever been since January 2018, retic count pending. BMP and AG WNL. UA pending and Upreg pending.  Clinical Course as of May 25 1538  Sun May 24, 2016  1401 Leuks in the urine but no bacteria. Patient with no symptoms of UTI. Reticulocyte count appropriately elevated. Patient continues to be belligerent  demand IV narcotics. Explained again to the patient that we will follow her pain plan as documented in EPIC.  [CV]    Clinical Course User Index [CV] Nita Sickle, MD    Pertinent labs & imaging results that were available during my care of the patient were reviewed by me and considered in my medical decision making (see chart for details).    ____________________________________________   FINAL CLINICAL IMPRESSION(S) / ED DIAGNOSES  Final diagnoses:  Sickle cell pain crisis (HCC)      NEW MEDICATIONS STARTED DURING THIS VISIT:  New Prescriptions   No medications on file     Note:  This document was prepared using Dragon voice recognition software and may include unintentional dictation errors.    Nita Sickle, MD 05/24/16 1540

## 2016-05-24 NOTE — ED Notes (Signed)
MD Veronese at bedside  

## 2016-05-24 NOTE — ED Notes (Signed)
Pt updated on transfer process. Pt states "I still don't understand what the hold up is. i'm sick and tired and I want to get the hell out of here." again explained to pt that hematologist has not officially accepted pt at [facility] at this time. Pt then states "I just don't understand why, how long do you think it's going to take, i'm tired". Explanation of transfer process again explained to pt. Pt continues to roll eyes and sigh regardless of explanation by this rn. Pt requesting additional pain medication. Po fluids offered to pt.

## 2016-05-25 ENCOUNTER — Encounter (HOSPITAL_COMMUNITY): Payer: Self-pay

## 2016-05-25 ENCOUNTER — Telehealth: Payer: Self-pay | Admitting: Hematology

## 2016-05-25 DIAGNOSIS — F339 Major depressive disorder, recurrent, unspecified: Secondary | ICD-10-CM

## 2016-05-25 DIAGNOSIS — G894 Chronic pain syndrome: Secondary | ICD-10-CM

## 2016-05-25 DIAGNOSIS — Z79891 Long term (current) use of opiate analgesic: Secondary | ICD-10-CM

## 2016-05-25 DIAGNOSIS — D5700 Hb-SS disease with crisis, unspecified: Principal | ICD-10-CM

## 2016-05-25 MED ORDER — HYDROMORPHONE 1 MG/ML IV SOLN
INTRAVENOUS | Status: DC
  Administered 2016-05-25: 7.8 mg via INTRAVENOUS
  Administered 2016-05-26: 6.6 mg via INTRAVENOUS
  Administered 2016-05-26: 06:00:00 via INTRAVENOUS
  Administered 2016-05-26: 8.4 mg via INTRAVENOUS
  Administered 2016-05-26: 20:00:00 via INTRAVENOUS
  Administered 2016-05-26: 6 mg via INTRAVENOUS
  Administered 2016-05-27: 2.2 mg via INTRAVENOUS
  Administered 2016-05-27: 7.99 mg via INTRAVENOUS
  Administered 2016-05-27: 12 mg via INTRAVENOUS
  Administered 2016-05-27: 5.96 mg via INTRAVENOUS
  Administered 2016-05-27: 11:00:00 via INTRAVENOUS
  Filled 2016-05-25 (×3): qty 25

## 2016-05-25 MED ORDER — PROCHLORPERAZINE EDISYLATE 5 MG/ML IJ SOLN
10.0000 mg | Freq: Four times a day (QID) | INTRAMUSCULAR | Status: DC | PRN
  Administered 2016-05-25 – 2016-05-29 (×7): 10 mg via INTRAVENOUS
  Filled 2016-05-25 (×7): qty 2

## 2016-05-25 MED ORDER — L-GLUTAMINE ORAL POWDER
10.0000 g | PACK | Freq: Two times a day (BID) | ORAL | Status: DC
  Administered 2016-05-25 – 2016-05-30 (×9): 10 g via ORAL
  Filled 2016-05-25 (×10): qty 2

## 2016-05-25 MED ORDER — SODIUM CHLORIDE 0.9% FLUSH: 10.0000 mL | Freq: Two times a day (BID) | INTRAVENOUS | Status: DC

## 2016-05-25 NOTE — Care Management Note (Signed)
Case Management Note  Patient Details  Name: Brittany Foster MRN: [id number] Date of Birth: 06/27/1985  Subjective/Objective:     ssc crisis-pain not controlled               Action/Plan:Date:  May 25, 2016 Chart reviewed for concurrent status and case management needs. Will continue to follow patient progress. Discharge Planning: following for needs Expected discharge date: [id number] Marcelle SmilingRhonda Santina Trillo, BSN, [place]3, [place]   [phone]   Expected Discharge Date:                  Expected Discharge Plan:  Home/Self Care  In-House Referral:     Discharge planning Services     Post Acute Care Choice:    Choice offered to:     DME Arranged:    DME Agency:     HH Arranged:    HH Agency:     Status of Service:  In process, will continue to follow  If discussed at Long Length of Stay Meetings, dates discussed:    Additional Comments:  [name], [name] [name], RN 05/25/2016, 10:25 AM

## 2016-05-25 NOTE — Progress Notes (Signed)
SICKLE CELL SERVICE PROGRESS NOTE  Tanya Crothers WUJ:[id number] DOB: 12-20-1985 DOA: 05/24/2016 PCP: [name] Patient  Assessment/Plan: Active Problems:   Sickle cell crisis (HCC)  1. Hb SS with crisis: Continue PCA at increase dose of 0.6 mg, Continue Toradol and start patient on Endari 10 mg BID.    2. Leukocytosis: no evidence of infection. Likely related to SCD.  3. Anemia of Chronic Disease: Hb Stable. Pt on sub-therapeutic dose of Hydrea. No data to support role of sub-therapeutic doses of Hydrea in the treatment of Sickle cell disease. 4. Chronic pain: Continue Morphine Sulfate. 5. Major Depressive Disorder: Continue Cymbalta and Abilify. 6. Psychosocial: Pt has been in the ED or Hospital almost everyday since the middle of January. She goes from hospital to hospital seeking relief of her pain via the use of IV Opiates. I am concerned and have been that she may have an addiction in addition to the fact that she certainly has a tolerance and possible dependence. She is not open to motivational interviewing with the goal of decreasing her dependence on Opiates. I will ask SW form the Sickle Cell Agency to speak with patient.   Code Status: Full Code Family Communication: N/A Disposition Plan: Not yet ready for discharge  Mozelle Remlinger A.  Pager [phone]. If 7PM-7AM, please contact night-coverage.  05/25/2016, 3:19 PM  LOS: 1 day   Interim History: Pt was seen at [facility] in [place] the day before presenting at [facility] for care. Documentation suggest that she left AMA because of dissatisfaction with her planned care. However patient reports that she left because her daughter was prohibited from being with ehr in the ED and she had no child care available for her child. Today she reports her pain in BLE's and hips at intensity of 8/10. Pt is willing to take L-glutamine.   Consultants:  None  Procedures:  None  Antibiotics:  None   Objective: Vitals:   05/25/16 0859  05/25/16 1304 05/25/16 1310 05/25/16 1430  BP: 126/74   96/61  Pulse: 82   81  Resp: 13 13 20 14   Temp: 97.9 F (36.6 C)   98.3 F (36.8 C)  TempSrc: Oral   Oral  SpO2: 100% 99% 99% 100%  Weight:      Height:       Weight change:   Intake/Output Summary (Last 24 hours) at 05/25/16 1519 Last data filed at 05/25/16 0900  Gross per 24 hour  Intake          1422.67 ml  Output                1 ml  Net          1421.67 ml    General: Alert, awake, oriented x3, in no acute distress. Well appearing. HEENT: [place]/AT PEERL, EOMI, anicteric Neck: Trachea midline,  no masses, no thyromegal,y no JVD, no carotid bruit OROPHARYNX:  Moist, No exudate/ erythema/lesions.  Heart: Regular rate and rhythm, without murmurs, rubs, gallops, PMI non-displaced, no heaves or thrills on palpation.  Lungs: Clear to auscultation, no wheezing or rhonchi noted. No increased vocal fremitus resonant to percussion  Abdomen: Soft, nontender, nondistended, positive bowel sounds, no masses no hepatosplenomegaly noted. Neuro: No focal neurological deficits noted cranial nerves II through XII grossly intact.  Strength at functional baseline in bilateral upper and lower extremities. Musculoskeletal: No warmth swelling or erythema around joints, no spinal tenderness noted. Psychiatric: Patient alert and oriented x3, good insight and cognition, good recent  to remote recall.    Data Reviewed: Basic Metabolic Panel:  Recent Labs Lab 05/24/16 1141  NA 139  K 3.6  CL 112*  CO2 22  GLUCOSE 135*  BUN 11  CREATININE 0.39*  CALCIUM 8.9   Liver Function Tests: No results for input(s): AST, ALT, ALKPHOS, BILITOT, PROT, ALBUMIN in the last 168 hours. No results for input(s): LIPASE, AMYLASE in the last 168 hours. No results for input(s): AMMONIA in the last 168 hours. CBC:  Recent Labs Lab 05/24/16 1141  WBC 13.8*  NEUTROABS 8.5*  HGB 9.4*  HCT 27.5*  MCV 96.3  PLT 319   Cardiac Enzymes: No results for  input(s): CKTOTAL, CKMB, CKMBINDEX, TROPONINI in the last 168 hours. BNP (last 3 results) No results for input(s): BNP in the last 8760 hours.  ProBNP (last 3 results) No results for input(s): PROBNP in the last 8760 hours.  CBG: No results for input(s): GLUCAP in the last 168 hours.  No results found for this or any previous visit (from the past 240 hour(s)).   Studies: No results found.  Scheduled Meds: . ARIPiprazole  10 mg Oral Daily  . DULoxetine  60 mg Oral Daily  . enoxaparin (LOVENOX) injection  40 mg Subcutaneous QHS  . folic acid  1 mg Oral Daily  . HYDROmorphone   Intravenous Q4H  . hydroxyurea  500 mg Oral Daily  . ketorolac  30 mg Intravenous Q6H  . morphine  30 mg Oral Q12H  . senna-docusate  1 tablet Oral BID  . sodium chloride flush  10-40 mL Intracatheter Q12H   Continuous Infusions: . dextrose 5 % and 0.45% NaCl 100 mL/hr at 05/25/16 1035    Active Problems:   Sickle cell crisis (HCC)     In excess of 35 minutes spent during this visit. Greater than 50% involved face to face contact with the patient for assessment, counseling and coordination of care.

## 2016-05-25 NOTE — Telephone Encounter (Signed)
Patient is requesting prescription refill on Oxycodone 10/325.   / Patient had LOV on 05/12/2016 / [facility] and [facility]

## 2016-05-26 ENCOUNTER — Encounter (HOSPITAL_COMMUNITY): Payer: Self-pay | Admitting: *Deleted

## 2016-05-26 LAB — URINE CULTURE: Culture: <10000 — AB

## 2016-05-26 LAB — HIV ANTIBODY (ROUTINE TESTING W REFLEX): HIV SCREEN 4TH GENERATION: NONREACTIVE

## 2016-05-26 MED ORDER — OXYCODONE-ACETAMINOPHEN 5-325 MG PO TABS
1.0000 | ORAL_TABLET | ORAL | Status: DC
  Administered 2016-05-26 – 2016-05-30 (×23): 1 via ORAL
  Filled 2016-05-26 (×24): qty 1

## 2016-05-26 MED ORDER — TRAZODONE HCL 50 MG PO TABS
25.0000 mg | ORAL_TABLET | Freq: Every evening | ORAL | Status: DC | PRN
  Administered 2016-05-26 – 2016-05-29 (×4): 25 mg via ORAL
  Filled 2016-05-26 (×4): qty 1

## 2016-05-26 MED ORDER — OXYCODONE HCL 5 MG PO TABS
5.0000 mg | ORAL_TABLET | ORAL | Status: DC
  Administered 2016-05-26 – 2016-05-30 (×23): 5 mg via ORAL
  Filled 2016-05-26 (×24): qty 1

## 2016-05-26 NOTE — Progress Notes (Signed)
SICKLE CELL SERVICE PROGRESS NOTE  Brittany Foster UEA:[id number] DOB: June 10, 1985 DOA: 05/24/2016 PCP: [name] Patient  Assessment/Plan: Active Problems:   Sickle cell crisis (HCC)  1. Hb SS with crisis:  Schedule Percocet 10-325 mg as she takes it ATC when in crisis.  Continue PCA at increase dose of 0.6 mg, Continue Toradol and Endari 10 mg BID.    2. Leukocytosis: no evidence of infection. Likely related to SCD.  3. Anemia of Chronic Disease: Hb Stable. Pt on sub-therapeutic dose of Hydrea. No data to support role of sub-therapeutic doses of Hydrea in the treatment of Sickle cell disease. 4. Chronic pain: Continue Morphine Sulfate. 5. Major Depressive Disorder: Continue Cymbalta and Abilify. 6. Psychosocial: Pt has been in the ED or Hospital almost everyday since the middle of January. She goes from hospital to hospital seeking relief of her pain via the use of IV Opiates. I am concerned and have been that she may have an addiction in addition to the fact that she certainly has a tolerance and possible dependence. She is not open to motivational interviewing with the goal of decreasing her dependence on Opiates. I will ask SW form the Sickle Cell Agency to speak with patient.   Code Status: Full Code Family Communication: N/A Disposition Plan: Not yet ready for discharge  Brittany Foster A.  Pager [phone]. If 7PM-7AM, please contact night-coverage.  05/26/2016, 3:43 PM  LOS: 2 days   Interim History: Pt reports pain still at 8/10 and localized to B/L hips,and BLE's. She has used 36.2 mg of Dilaudid with 66/61:demands/deliveries in the last 24 hours. Pt reports that she attempted to ambulate today but was unable to due to pain in her hips. Last BM yesterday.   Consultants:  None  Procedures:  None  Antibiotics:  None   Objective: Vitals:   05/26/16 0827 05/26/16 1000 05/26/16 1237 05/26/16 1300  BP:  98/60  103/62  Pulse:  97  90  Resp: 12 14 17 16   Temp:  98.2 F  (36.8 C)  98.6 F (37 C)  TempSrc:  Oral  Oral  SpO2: 96% 97% 95% 95%  Weight:      Height:       Weight change:   Intake/Output Summary (Last 24 hours) at 05/26/16 1543 Last data filed at 05/26/16 1353  Gross per 24 hour  Intake             1654 ml  Output                0 ml  Net             1654 ml    General: Alert, awake, oriented x3, in no acute distress. Well appearing. HEENT: [place]/AT PEERL, EOMI, anicteric Neck: Trachea midline,  no masses, no thyromegal,y no JVD, no carotid bruit OROPHARYNX:  Moist, No exudate/ erythema/lesions.  Heart: Regular rate and rhythm, without murmurs, rubs, gallops, PMI non-displaced, no heaves or thrills on palpation.  Lungs: Clear to auscultation, no wheezing or rhonchi noted. No increased vocal fremitus resonant to percussion  Abdomen: Soft, nontender, nondistended, positive bowel sounds, no masses no hepatosplenomegaly noted. Neuro: No focal neurological deficits noted cranial nerves II through XII grossly intact.  Strength at functional baseline in bilateral upper and lower extremities. Musculoskeletal: No warmth swelling or erythema around joints, no spinal tenderness noted. Psychiatric: Patient alert and oriented x3, good insight and cognition, good recent to remote recall.    Data Reviewed: Basic Metabolic Panel:  Recent Labs Lab 05/24/16 1141  NA 139  K 3.6  CL 112*  CO2 22  GLUCOSE 135*  BUN 11  CREATININE 0.39*  CALCIUM 8.9   Liver Function Tests: No results for input(s): AST, ALT, ALKPHOS, BILITOT, PROT, ALBUMIN in the last 168 hours. No results for input(s): LIPASE, AMYLASE in the last 168 hours. No results for input(s): AMMONIA in the last 168 hours. CBC:  Recent Labs Lab 05/24/16 1141  WBC 13.8*  NEUTROABS 8.5*  HGB 9.4*  HCT 27.5*  MCV 96.3  PLT 319   Cardiac Enzymes: No results for input(s): CKTOTAL, CKMB, CKMBINDEX, TROPONINI in the last 168 hours. BNP (last 3 results) No results for input(s): BNP in  the last 8760 hours.  ProBNP (last 3 results) No results for input(s): PROBNP in the last 8760 hours.  CBG: No results for input(s): GLUCAP in the last 168 hours.  Recent Results (from the past 240 hour(s))  Urine culture     Status: Abnormal   Collection Time: 05/24/16  1:28 PM  Result Value Ref Range Status   Specimen Description URINE, RANDOM  Final   Special Requests NONE  Final   Culture (A)  Final    <10,000 COLONIES/mL INSIGNIFICANT GROWTH Performed at [facility], [address] [place], [place] [address]    Report Status 05/26/2016 FINAL  Final     Studies: No results found.  Scheduled Meds: . ARIPiprazole  10 mg Oral Daily  . DULoxetine  60 mg Oral Daily  . enoxaparin (LOVENOX) injection  40 mg Subcutaneous QHS  . folic acid  1 mg Oral Daily  . HYDROmorphone   Intravenous Q4H  . hydroxyurea  500 mg Oral Daily  . ketorolac  30 mg Intravenous Q6H  . L-glutamine  10 g Oral BID  . morphine  30 mg Oral Q12H  . oxyCODONE-acetaminophen  1 tablet Oral Q4H   And  . oxyCODONE  5 mg Oral Q4H  . senna-docusate  1 tablet Oral BID  . sodium chloride flush  10-40 mL Intracatheter Q12H   Continuous Infusions: . dextrose 5 % and 0.45% NaCl 10 mL/hr at 05/25/16 1716    Active Problems:   Sickle cell crisis (HCC)     In excess of 25 minutes spent during this visit. Greater than 50% involved face to face contact with the patient for assessment, counseling and coordination of care.

## 2016-05-27 ENCOUNTER — Encounter (HOSPITAL_COMMUNITY): Payer: Self-pay | Admitting: *Deleted

## 2016-05-27 LAB — CBC WITH DIFFERENTIAL/PLATELET
Basophils Absolute: 0.1 10*3/uL (ref 0.0–0.1)
Basophils Relative: 1 %
EOS ABS: 0.5 10*3/uL (ref 0.0–0.7)
Eosinophils Relative: 4 %
HEMATOCRIT: 20.2 % — AB (ref 36.0–46.0)
HEMOGLOBIN: 7.1 g/dL — AB (ref 12.0–15.0)
LYMPHS ABS: 4.6 10*3/uL — AB (ref 0.7–4.0)
Lymphocytes Relative: 36 %
MCH: 32.7 pg (ref 26.0–34.0)
MCHC: 35.1 g/dL (ref 30.0–36.0)
MCV: 93.1 fL (ref 78.0–100.0)
MONOS PCT: 7 %
Monocytes Absolute: 0.9 10*3/uL (ref 0.1–1.0)
NEUTROS ABS: 6.8 10*3/uL (ref 1.7–7.7)
NEUTROS PCT: 52 %
Platelets: 328 10*3/uL (ref 150–400)
RBC: 2.17 MIL/uL — ABNORMAL LOW (ref 3.87–5.11)
RDW: 19.2 % — ABNORMAL HIGH (ref 11.5–15.5)
WBC: 12.9 10*3/uL — ABNORMAL HIGH (ref 4.0–10.5)

## 2016-05-27 LAB — RETICULOCYTES
RBC.: 2.17 MIL/uL — ABNORMAL LOW (ref 3.87–5.11)
Retic Count, Absolute: 290.8 10*3/uL — ABNORMAL HIGH (ref 19.0–186.0)
Retic Ct Pct: 13.4 % — ABNORMAL HIGH (ref 0.4–3.1)

## 2016-05-27 MED ORDER — HYDROMORPHONE 1 MG/ML IV SOLN
INTRAVENOUS | Status: DC
  Administered 2016-05-27: 14.4 mg via INTRAVENOUS
  Administered 2016-05-27: 2.72 mg via INTRAVENOUS
  Administered 2016-05-27: 17:00:00 via INTRAVENOUS
  Administered 2016-05-27: 5.6 mg via INTRAVENOUS
  Administered 2016-05-28: 9.1 mg via INTRAVENOUS
  Administered 2016-05-28: 9.8 mg via INTRAVENOUS
  Administered 2016-05-28: 7.7 mg via INTRAVENOUS
  Administered 2016-05-28: 11.9 mg via INTRAVENOUS
  Administered 2016-05-28 – 2016-05-29 (×3): via INTRAVENOUS
  Administered 2016-05-29: 5.5 mg via INTRAVENOUS
  Administered 2016-05-29: 8.4 mg via INTRAVENOUS
  Administered 2016-05-29 (×2): 9.8 mg via INTRAVENOUS
  Filled 2016-05-27 (×4): qty 25

## 2016-05-27 MED ORDER — HYDROMORPHONE HCL 1 MG/ML IJ SOLN
1.0000 mg | Freq: Once | INTRAMUSCULAR | Status: AC
  Administered 2016-05-27: 1 mg via INTRAVENOUS
  Filled 2016-05-27: qty 1

## 2016-05-27 NOTE — Progress Notes (Signed)
SICKLE CELL SERVICE PROGRESS NOTE  Brittany Foster ZOX:[id number] DOB: 16-Nov-1985 DOA: 05/24/2016 PCP: [name] Patient  Assessment/Plan: Active Problems:   Sickle cell crisis (HCC)  1. Hb SS with crisis:  Schedule Percocet 10-325 mg as she takes it ATC when in crisis.  Continue PCA at bolus of 0.6 mg, Continue Toradol and Endari 10 mg BID.    2. Leukocytosis: no evidence of infection. Likely related to SCD.  3. Anemia of Chronic Disease: Hb Stable. Pt on sub-therapeutic dose of Hydrea. No data to support role of sub-therapeutic doses of Hydrea in the treatment of Sickle cell disease. Hb at baseline continue to monitor.  4. Chronic pain: Continue Morphine Sulfate. 5. Major Depressive Disorder: Continue Cymbalta and Abilify.  Code Status: Full Code Family Communication: N/A Disposition Plan: Goal for discharge remains ability to ambulate rather than comfort. Anticipate discharge in next 2 days,   [name],[name]  Pager [phone]. If 7PM-7AM, please contact night-coverage.  05/27/2016, 2:46 PM  LOS: 3 days   Interim History: Pt reports pain still at 9/10 and localized to B/L hips,and BLE's. She has used 42.56 mg of Dilaudid with 79/71:demands/deliveries in the last 24 hours. Pt reports that she attempted to ambulate today but was unable to due to pain in her hips. Last BM 3 days ago   Consultants:  None  Procedures:  None  Antibiotics:  None   Objective: Vitals:   05/27/16 0454 05/27/16 0804 05/27/16 1003 05/27/16 1102  BP: (!) 94/58  (!) 91/55   Pulse: 87  90   Resp: 16 13 16 15   Temp: 98.4 F (36.9 C)  98.7 F (37.1 C)   TempSrc: Oral  Oral   SpO2: 97% 95% 96% 96%  Weight:      Height:       Weight change:   Intake/Output Summary (Last 24 hours) at 05/27/16 1446 Last data filed at 05/27/16 1300  Gross per 24 hour  Intake              340 ml  Output                0 ml  Net              340 ml    General: Alert, awake, oriented x3, in no acute distress.  Well appearing. HEENT: [place]/AT PEERL, EOMI, anicteric Neck: Trachea midline,  no masses, no thyromegal,y no JVD, no carotid bruit OROPHARYNX:  Moist, No exudate/ erythema/lesions.  Heart: Regular rate and rhythm, without murmurs, rubs, gallops, PMI non-displaced, no heaves or thrills on palpation.  Lungs: Clear to auscultation, no wheezing or rhonchi noted. No increased vocal fremitus resonant to percussion  Abdomen: Soft, nontender, nondistended, positive bowel sounds, no masses no hepatosplenomegaly noted. Neuro: No focal neurological deficits noted cranial nerves II through XII grossly intact.  Strength at functional baseline in bilateral upper and lower extremities. Musculoskeletal: No warmth swelling or erythema around joints, no spinal tenderness noted. Psychiatric: Patient alert and oriented x3, good insight and cognition, good recent to remote recall.    Data Reviewed: Basic Metabolic Panel:  Recent Labs Lab 05/24/16 1141  NA 139  K 3.6  CL 112*  CO2 22  GLUCOSE 135*  BUN 11  CREATININE 0.39*  CALCIUM 8.9   Liver Function Tests: No results for input(s): AST, ALT, ALKPHOS, BILITOT, PROT, ALBUMIN in the last 168 hours. No results for input(s): LIPASE, AMYLASE in the last 168 hours. No results for input(s): AMMONIA in  the last 168 hours. CBC:  Recent Labs Lab 05/24/16 1141 05/27/16 0421  WBC 13.8* 12.9*  NEUTROABS 8.5* 6.8  HGB 9.4* 7.1*  HCT 27.5* 20.2*  MCV 96.3 93.1  PLT 319 328   Cardiac Enzymes: No results for input(s): CKTOTAL, CKMB, CKMBINDEX, TROPONINI in the last 168 hours. BNP (last 3 results) No results for input(s): BNP in the last 8760 hours.  ProBNP (last 3 results) No results for input(s): PROBNP in the last 8760 hours.  CBG: No results for input(s): GLUCAP in the last 168 hours.  Recent Results (from the past 240 hour(s))  Urine culture     Status: Abnormal   Collection Time: 05/24/16  1:28 PM  Result Value Ref Range Status   Specimen  Description URINE, RANDOM  Final   Special Requests NONE  Final   Culture (A)  Final    <10,000 COLONIES/mL INSIGNIFICANT GROWTH Performed at [facility], [address] [place], [place] [address]    Report Status 05/26/2016 FINAL  Final     Studies: No results found.  Scheduled Meds: . ARIPiprazole  10 mg Oral Daily  . DULoxetine  60 mg Oral Daily  . enoxaparin (LOVENOX) injection  40 mg Subcutaneous QHS  . folic acid  1 mg Oral Daily  . HYDROmorphone   Intravenous Q4H  . hydroxyurea  500 mg Oral Daily  . ketorolac  30 mg Intravenous Q6H  . L-glutamine  10 g Oral BID  . morphine  30 mg Oral Q12H  . oxyCODONE-acetaminophen  1 tablet Oral Q4H   And  . oxyCODONE  5 mg Oral Q4H  . senna-docusate  1 tablet Oral BID  . sodium chloride flush  10-40 mL Intracatheter Q12H   Continuous Infusions: . dextrose 5 % and 0.45% NaCl 10 mL/hr at 05/25/16 1716    Active Problems:   Sickle cell crisis (HCC)     In excess of 25 minutes spent during this visit. Greater than 50% involved face to face contact with the patient for assessment, counseling and coordination of care.

## 2016-05-28 ENCOUNTER — Telehealth: Payer: Self-pay | Admitting: General Practice

## 2016-05-28 ENCOUNTER — Other Ambulatory Visit: Payer: Self-pay | Admitting: Internal Medicine

## 2016-05-28 DIAGNOSIS — R42 Dizziness and giddiness: Secondary | ICD-10-CM

## 2016-05-28 DIAGNOSIS — D5700 Hb-SS disease with crisis, unspecified: Secondary | ICD-10-CM

## 2016-05-28 DIAGNOSIS — D638 Anemia in other chronic diseases classified elsewhere: Secondary | ICD-10-CM

## 2016-05-28 LAB — CBC WITH DIFFERENTIAL/PLATELET
Basophils Absolute: 0.1 10*3/uL (ref 0.0–0.1)
Basophils Relative: 0 %
EOS PCT: 3 %
Eosinophils Absolute: 0.4 10*3/uL (ref 0.0–0.7)
HCT: 20.5 % — ABNORMAL LOW (ref 36.0–46.0)
Hemoglobin: 7.2 g/dL — ABNORMAL LOW (ref 12.0–15.0)
LYMPHS ABS: 4.2 10*3/uL — AB (ref 0.7–4.0)
LYMPHS PCT: 33 %
MCH: 33.2 pg (ref 26.0–34.0)
MCHC: 35.1 g/dL (ref 30.0–36.0)
MCV: 94.5 fL (ref 78.0–100.0)
MONO ABS: 1 10*3/uL (ref 0.1–1.0)
Monocytes Relative: 8 %
Neutro Abs: 7.1 10*3/uL (ref 1.7–7.7)
Neutrophils Relative %: 56 %
PLATELETS: 352 10*3/uL (ref 150–400)
RBC: 2.17 MIL/uL — ABNORMAL LOW (ref 3.87–5.11)
RDW: 20.3 % — ABNORMAL HIGH (ref 11.5–15.5)
WBC: 12.8 10*3/uL — ABNORMAL HIGH (ref 4.0–10.5)

## 2016-05-28 LAB — RETICULOCYTES
RBC.: 2.17 MIL/uL — ABNORMAL LOW (ref 3.87–5.11)
Retic Count, Absolute: 310.3 10*3/uL — ABNORMAL HIGH (ref 19.0–186.0)
Retic Ct Pct: 14.3 % — ABNORMAL HIGH (ref 0.4–3.1)

## 2016-05-28 LAB — LACTATE DEHYDROGENASE: LDH: 248 U/L — ABNORMAL HIGH (ref 98–192)

## 2016-05-28 MED ORDER — SODIUM CHLORIDE 0.9 % IV BOLUS (SEPSIS)
1000.0000 mL | Freq: Once | INTRAVENOUS | Status: AC
  Administered 2016-05-28: 1000 mL via INTRAVENOUS

## 2016-05-28 MED ORDER — DEXTROSE-NACL 5-0.45 % IV SOLN
INTRAVENOUS | Status: DC
Start: 2016-05-28 — End: 2016-05-30
  Administered 2016-05-28: 23:00:00 via INTRAVENOUS

## 2016-05-28 MED ORDER — OXYCODONE-ACETAMINOPHEN 10-325 MG PO TABS: 1.0000 | ORAL_TABLET | ORAL | 0 refills | Status: DC | PRN

## 2016-05-28 NOTE — Telephone Encounter (Signed)
Pt called stating that she WILL be attending treatment in [place] and needs to get pain meds prescribed before she goes. Please call pt to follow up. Thank you.

## 2016-05-28 NOTE — Progress Notes (Signed)
SICKLE CELL SERVICE PROGRESS NOTE  Brittany Foster ZOX:[id number] DOB: 1985/04/04 DOA: 05/24/2016 PCP: [name] Patient  Assessment/Plan: Active Problems:   Sickle cell crisis (HCC)  1. Dizziness: Pr c/o dizziness with ambulating. Will obtain orthostatic vital signs and check electrolytes and renal function. Mild tachycardia noted on examination. If orthostatic vital signs positive, will give IVF.  2. Hb SS with crisis: Continue MS Contin and scheduled Percocet 10-325 mg as she takes it ATC when in crisis. Continue PCA at bolus of 0.6 mg, Continue Toradol and Endari 10 mg BID.    3. Leukocytosis: no evidence of infection. Likely related to SCD.  4. Anemia of Chronic Disease: Hb remians Stable. Pt on sub-therapeutic dose of Hydrea. No data to support role of sub-therapeutic doses of Hydrea in the treatment of Sickle cell disease. Hb at baseline continue to monitor.  5. Chronic pain: Continue Morphine Sulfate. 6. Major Depressive Disorder: Continue Cymbalta and Abilify. 7. Psychosocial: Spoke with her PMD Dr. Hyman Hopes today and he informed me that patient has refused placement for residential drug rehabilitation in [place]. He also informed me that he will not be prescribing her pain medications going forward. However when I arrived to see the patient today she informed me that she plans to go to  Residential Rehab in [place] and that she had confirmed that with the SW from [facility]. I spoke with the SW and she confirmed the conversation. I asked her to speak with Dr. Hyman Hopes and make him aware of patient's decision.   Code Status: Full Code Family Communication: N/Foster Disposition Plan: Anticipate discharge tomorrow.  Brittany Foster.  Pager [phone]. If 7PM-7AM, please contact night-coverage.  05/28/2016, 1:57 PM  LOS: 4 days   Interim History: Pt reports pain still at 9/10 and localized to B/L hips,and BLE's. She has used 56.92 mg of Dilaudid with  116/82:demands/deliveries in the last 24 hours. Pt reports that she feels dizzy with standing and not as well as she did yesterday.   Consultants:  None  Procedures:  None  Antibiotics:  None   Objective: Vitals:   05/28/16 0541 05/28/16 0800 05/28/16 1106 05/28/16 1236  BP:   (!) 93/55   Pulse:   92   Resp: 15 14 14 16   Temp:   98.8 F (37.1 C)   TempSrc:   Oral   SpO2: 94% 94% 96% 94%  Weight:      Height:       Weight change:   Intake/Output Summary (Last 24 hours) at 05/28/16 1357 Last data filed at 05/28/16 0300  Gross per 24 hour  Intake              140 ml  Output                0 ml  Net              140 ml    General: Alert, awake, oriented x3, in no apparent distress. Well appearing. HEENT: [place]/AT PEERL, EOMI, anicteric Neck: Trachea midline,  no masses, no thyromegal,y no JVD, no carotid bruit OROPHARYNX:  Moist, No exudate/ erythema/lesions.  Heart: Regular rate and rhythm, without murmurs, rubs, gallops, PMI non-displaced, no heaves or thrills on palpation.  Lungs: Clear to auscultation, no wheezing or rhonchi noted. No increased vocal fremitus resonant to percussion  Abdomen: Soft, nontender, nondistended, positive bowel sounds, no masses no hepatosplenomegaly noted. Neuro: No focal neurological deficits noted cranial nerves II through XII  grossly intact.  Strength at functional baseline in bilateral upper and lower extremities. Musculoskeletal: No warmth swelling or erythema around joints, no spinal tenderness noted. Psychiatric: Patient alert and oriented x3, good insight and cognition, good recent to remote recall.    Data Reviewed: Basic Metabolic Panel:  Recent Labs Lab 05/24/16 1141  NA 139  K 3.6  CL 112*  CO2 22  GLUCOSE 135*  BUN 11  CREATININE 0.39*  CALCIUM 8.9   Liver Function Tests: No results for input(s): AST, ALT, ALKPHOS, BILITOT, PROT, ALBUMIN in the last 168 hours. No results for input(s): LIPASE, AMYLASE in the last  168 hours. No results for input(s): AMMONIA in the last 168 hours. CBC:  Recent Labs Lab 05/24/16 1141 05/27/16 0421 05/28/16 1133  WBC 13.8* 12.9* 12.8*  NEUTROABS 8.5* 6.8 7.1  HGB 9.4* 7.1* 7.2*  HCT 27.5* 20.2* 20.5*  MCV 96.3 93.1 94.5  PLT 319 328 352   Cardiac Enzymes: No results for input(s): CKTOTAL, CKMB, CKMBINDEX, TROPONINI in the last 168 hours. BNP (last 3 results) No results for input(s): BNP in the last 8760 hours.  ProBNP (last 3 results) No results for input(s): PROBNP in the last 8760 hours.  CBG: No results for input(s): GLUCAP in the last 168 hours.  Recent Results (from the past 240 hour(s))  Urine culture     Status: Abnormal   Collection Time: 05/24/16  1:28 PM  Result Value Ref Range Status   Specimen Description URINE, RANDOM  Final   Special Requests NONE  Final   Culture (Foster)  Final    <10,000 COLONIES/mL INSIGNIFICANT GROWTH Performed at [facility], [address] [place], [place] [address]    Report Status 05/26/2016 FINAL  Final     Studies: No results found.  Scheduled Meds: . ARIPiprazole  10 mg Oral Daily  . DULoxetine  60 mg Oral Daily  . enoxaparin (LOVENOX) injection  40 mg Subcutaneous QHS  . folic acid  1 mg Oral Daily  . HYDROmorphone   Intravenous Q4H  . hydroxyurea  500 mg Oral Daily  . ketorolac  30 mg Intravenous Q6H  . L-glutamine  10 g Oral BID  . morphine  30 mg Oral Q12H  . oxyCODONE-acetaminophen  1 tablet Oral Q4H   And  . oxyCODONE  5 mg Oral Q4H  . senna-docusate  1 tablet Oral BID  . sodium chloride flush  10-40 mL Intracatheter Q12H   Continuous Infusions: . dextrose 5 % and 0.45% NaCl 10 mL/hr at 05/25/16 1716    Active Problems:   Sickle cell crisis (HCC)     In excess of 25 minutes spent during this visit. Greater than 50% involved face to face contact with the patient for assessment, counseling and coordination of care.

## 2016-05-28 NOTE — Plan of Care (Addendum)
Brittany CampbellMiranda Foster has been in the ED or Hospital almost everyday since the middle of January. She has had 20 admissions in the last 6 months, [facility], [facility], [address] [facility], [facility], [facility] and [facility]. Lately patient also started to present at [facility] ED for same complaint. I get called from most of the out of network hospitals when Brittany Foster presents there either in ED or admission for the same issue and demanding IV Dilaudid pushes. Brittany Foster has had 9 normal CT angiogram and numerous Chest X-Rays in the last year.   I am concerned that she may have an addiction in addition to the fact that she certainly has a tolerance and possible dependence. She is not open to motivational interviewing with the goal of decreasing her dependence on Opiates. She had said in the past that she does not like to take PO medications yet she asks for refills of her home opioids every 2 weeks despite being in the ED or on admission most of the time. We have had several discussions around this prevailing behaviors and she confessed that she is opiate dependent and eventually agreed to work with us after 3 failed attempts to go to a Rehab for detox and receptor reset. With the help of Foster at the [facility], we secured a spot for Brittany Foster for this purpose at a local Rehab center, although she checked in, but she left the center almost immediately and her reason was: "they said they would not give me my pain medications".   Brittany Foster was clearly educated about the need to detox and the benefits therefrom. She always verbalized understanding and expressed gratitude for "helping me out". OnTuesday 05/26/2016, through a rigorous process but with luck, we again secured a residential rehab for Brittany Foster where she was offered 2 bedroom private space in [place]; and the [facility] agreed to transport patient to the center  once discharged from the hospital. When presented with this opportunity, Brittany Foster rejected the offer and says she rather continues her ED and hospital utilizations.   Brittany Foster has been uncooperative, refusing to follow treatment advice, and she presents difficulties in the provider-patient relationship and therefore will be given 30 days notice to look for another health care provider.  Brittany Foster Brittany Centrella, MD   05/28/2016 11:30 am   14:05 Hr. Addendum  I have just received a call from the Brittany Foster that Brittany Foster has changed her mind and will now comply with our recommendation and proceed to [facility] [place]. They have arranged transportation and she will check in to the facility on April 3rd, 2018 all things being equal.   I am again hopeful that we may be able to provide [facility] when she complete this program, otherwise, the conclusion above holds.   Brittany Foster Brittany Niblack, MD   05/28/2016

## 2016-05-28 NOTE — Telephone Encounter (Signed)
Patient request

## 2016-05-29 ENCOUNTER — Encounter (HOSPITAL_COMMUNITY): Payer: Self-pay | Admitting: Nurse Practitioner

## 2016-05-29 DIAGNOSIS — F609 Personality disorder, unspecified: Secondary | ICD-10-CM

## 2016-05-29 MED ORDER — MECLIZINE HCL 25 MG PO TABS: 25.0000 mg | ORAL_TABLET | Freq: Three times a day (TID) | ORAL | Status: DC | PRN

## 2016-05-29 MED ORDER — HYDROMORPHONE 1 MG/ML IV SOLN
INTRAVENOUS | Status: DC
  Administered 2016-05-29: 14:00:00 via INTRAVENOUS
  Administered 2016-05-29: 14.4 mg via INTRAVENOUS
  Administered 2016-05-29: 8.2 mg via INTRAVENOUS
  Administered 2016-05-30: 1 mg via INTRAVENOUS
  Administered 2016-05-30: 5 mg via INTRAVENOUS
  Filled 2016-05-29: qty 25

## 2016-05-29 MED ORDER — MECLIZINE HCL 25 MG PO TABS
25.0000 mg | ORAL_TABLET | Freq: Three times a day (TID) | ORAL | Status: AC
  Administered 2016-05-29 (×3): 25 mg via ORAL
  Filled 2016-05-29 (×3): qty 1

## 2016-05-29 MED ORDER — HYDROMORPHONE 1 MG/ML IV SOLN: INTRAVENOUS | Status: DC

## 2016-05-29 MED ORDER — LORAZEPAM 0.5 MG PO TABS
0.5000 mg | ORAL_TABLET | Freq: Once | ORAL | Status: AC
  Administered 2016-05-29: 0.5 mg via ORAL
  Filled 2016-05-29: qty 1

## 2016-05-29 NOTE — Progress Notes (Signed)
SICKLE CELL SERVICE PROGRESS NOTE  Brittany Foster ZOX:[id number] DOB: 1985-08-06 DOA: 05/24/2016 PCP: [name] Patient  Assessment/Plan: Active Problems:   Sickle cell crisis (HCC)  1. Dizziness: Orthostatic vital signs negative. Will give trial of Meclizine and if dizziness persists, will obtain a CT head.  2. Hb SS with crisis: Continue MS Contin and scheduled Percocet 10-325 mg as she takes it ATC when in crisis. Decrease PCA to bolus of 0.5 mg, Continue Toradol and Endari 10 mg BID.    3. Leukocytosis: no evidence of infection. Likely related to SCD.  4. Anemia of Chronic Disease: Hb remians Stable. Pt on sub-therapeutic dose of Hydrea. No data to support role of sub-therapeutic doses of Hydrea in the treatment of Sickle cell disease. Hb at baseline continue to monitor.  5. Chronic pain: Continue Morphine Sulfate. 6. Major Depressive Disorder: Continue Cymbalta and Abilify. Pt should be seen by Psychiatry in the ambulatory setting as she appears emotionally labile. She is neither suicidal or homicidal.  7. Psychosocial: Pt has issues of trust and fancies herself as having a personal relationship with the Physician thus refusing to advise nursing staff of ongoing symptoms. This results in delayed care and prolonged hospitalization. I have asked patient to advise the nurse of any new or ongoing symptoms and not misrepresent her clinical state to the nurses. Patient agrees. Nurse present during visit and discussion.    Code Status: Full Code Family Communication: N/A Disposition Plan: Not yet ready for discharge   [name],[name]  Pager [phone]. If 7PM-7AM, please contact night-coverage.  05/29/2016, 11:44 AM  LOS: 5 days   Interim History: Came to see patient to day for an expected discharge home and she reports that she does not feel up to going home today. I asked patient to elaborate and she states that her gait was "wobbly". I advised patient that having a "wobbly" gait would  not delay her discharge and she then stated that she was having dizziness at rest and with ambulation. She also c/o nausea which she initially stated was a new symptom and then once reminded that she had requested anti-emetic medication and received it on several occasions, then stated that she has been having nausea. I advised patient that I would investigate that dizziness and requested orthostatic vital signs. Nurse reported that patient had not reported any dizziness and had rather denied any symptoms. When asked by the nurse in my presence patient that she doesn't tell any nurses about her problems as she doesn't want them in her business. Patient advised of the importance of notifying nursing staff when she has problems.   I also advised patient that I would be weaning the PCA  and pt then suddenly  got dressed and stated that she was leaving regardless of the dizziness and any investigations. She reports that her fiance was able to pick up her prescriptions for her pain medications from her Provides office. Pt has again changed her mind stating that she will remain to have her dizziness evaluated.   Pt reports pain in her hips bilaterally. She ambulated this morning in the halls without difficulty. She used 56.9 mg on the PCA with 83/82:demands/deliveries in the last 24 hours. Nurse reports that patient c/o dizziness after ambulating about 200 ft.   Consultants:  None  Procedures:  None  Antibiotics:  None   Objective: Vitals:   05/29/16 0415 05/29/16 0437 05/29/16 0838 05/29/16 0951  BP:  (!) 94/58 (!) 100/56   Pulse:  90 97   Resp: Temp:  98.3 F (36.8 C) 98.5 F (36.9 C)   TempSrc:  Oral Oral   SpO2: 94% 91% 95% 93%  Weight:      Height:       Weight change:   Intake/Output Summary (Last 24 hours) at 05/29/16 1144 Last data filed at 05/29/16 0805  Gross per 24 hour  Intake              410 ml  Output                0 ml  Net              410 ml     General: Alert, awake, oriented x3, in no apparent distress. Well appearing. HEENT: [place]/AT PEERL, EOMI, anicteric Neck: Trachea midline,  no masses, no thyromegal,y no JVD, no carotid bruit OROPHARYNX:  Moist, No exudate/ erythema/lesions.  Heart: Regular rate and rhythm, without murmurs, rubs, gallops, PMI non-displaced, no heaves or thrills on palpation.  Lungs: Clear to auscultation, no wheezing or rhonchi noted. No increased vocal fremitus resonant to percussion  Abdomen: Soft, nontender, nondistended, positive bowel sounds, no masses no hepatosplenomegaly noted. Neuro: No focal neurological deficits noted cranial nerves II through XII grossly intact.  Strength at functional baseline in bilateral upper and lower extremities. Musculoskeletal: No warmth swelling or erythema around joints, no spinal tenderness noted. Psychiatric: Patient alert and oriented x 3.   Data Reviewed: Basic Metabolic Panel:  Recent Labs Lab 05/24/16 1141  NA 139  K 3.6  CL 112*  CO2 22  GLUCOSE 135*  BUN 11  CREATININE 0.39*  CALCIUM 8.9   Liver Function Tests: No results for input(s): AST, ALT, ALKPHOS, BILITOT, PROT, ALBUMIN in the last 168 hours. No results for input(s): LIPASE, AMYLASE in the last 168 hours. No results for input(s): AMMONIA in the last 168 hours. CBC:  Recent Labs Lab 05/24/16 1141 05/27/16 0421 05/28/16 1133  WBC 13.8* 12.9* 12.8*  NEUTROABS 8.5* 6.8 7.1  HGB 9.4* 7.1* 7.2*  HCT 27.5* 20.2* 20.5*  MCV 96.3 93.1 94.5  PLT 319 328 352   Cardiac Enzymes: No results for input(s): CKTOTAL, CKMB, CKMBINDEX, TROPONINI in the last 168 hours. BNP (last 3 results) No results for input(s): BNP in the last 8760 hours.  ProBNP (last 3 results) No results for input(s): PROBNP in the last 8760 hours.  CBG: No results for input(s): GLUCAP in the last 168 hours.  Recent Results (from the past 240 hour(s))  Urine culture     Status: Abnormal   Collection Time: 05/24/16   1:28 PM  Result Value Ref Range Status   Specimen Description URINE, RANDOM  Final   Special Requests NONE  Final   Culture (A)  Final    <10,000 COLONIES/mL INSIGNIFICANT GROWTH Performed at [facility], [address], [place], [place] [address]    Report Status 05/26/2016 FINAL  Final     Studies: No results found.  Scheduled Meds: . ARIPiprazole  10 mg Oral Daily  . DULoxetine  60 mg Oral Daily  . enoxaparin (LOVENOX) injection  40 mg Subcutaneous QHS  . folic acid  1 mg Oral Daily  . HYDROmorphone   Intravenous Q4H  . hydroxyurea  500 mg Oral Daily  . ketorolac  30 mg Intravenous Q6H  . L-glutamine  10 g Oral BID  . meclizine  25 mg Oral TID  . morphine  30 mg Oral Q12H  . oxyCODONE-acetaminophen  1 tablet Oral Q4H   And  . oxyCODONE  5 mg Oral Q4H  . senna-docusate  1 tablet Oral BID  . sodium chloride flush  10-40 mL Intracatheter Q12H   Continuous Infusions: . dextrose 5 % and 0.45% NaCl 10 mL/hr at 05/28/16 2300    Active Problems:   Sickle cell crisis (HCC)   In excess of 40 minutes spent during this visit. Greater than 50% involved face to face contact with the patient for assessment, counseling and coordination of care.

## 2016-05-29 NOTE — Progress Notes (Signed)
Patient ID: Brittany Foster, female   DOB: [birth year], 31 y.o.   MRN: [id number] Lat Entry for 05/28/2016.  I spoke with Dr. Hyman Hopes and advised him of patients decision to accept residential rehabilitation and her expressed concern that she would not have pain medications to manage her pain until getting to the rehab. He states that he had been advised of the same from the Child psychotherapist at [employer] [facility] [employer]. He asked me to convey to [name] that she could pick up her prescriptions before noon on Friday 05/29/2016. He also asked me to convey that the expectation was that she would not only present for residential rehab but also that she would complete the program.    Pt verbalized understanding in teach back form. However expressed anger at having to pursue residential care.   [name],[name]

## 2016-05-29 NOTE — Progress Notes (Signed)
Pt ambulated in the hallway and complained of dizziness at that time. She states that it has been an on-going issue when asked by MD. When this RN ask patient if there is anything wrong or is having any symptoms pt always states "no". Pt stated that she only wanted to tell the MD about the on-going dizziness she was having and felt that the nursing staff could not help with symptoms.

## 2016-05-30 MED ORDER — L-GLUTAMINE ORAL POWDER: 10.0000 g | PACK | Freq: Two times a day (BID) | ORAL | 3 refills | Status: DC

## 2016-05-30 NOTE — Plan of Care (Signed)
Problem: Pain Managment: Goal: General experience of comfort will improve Continue.   Problem: Physical Regulation: Goal: Ability to maintain clinical measurements within normal limits will improve Outcome: Progressing .  Problem: Activity: Goal: Risk for activity intolerance will decrease Outcome: Progressing .

## 2016-05-30 NOTE — Discharge Summary (Signed)
Physician Discharge Summary  Brittany Foster UJW:[id number] DOB: 25-Jul-1985 DOA: 05/24/2016  PCP: [name] Patient  Admit date: 05/24/2016  Discharge date: 05/30/2016  Discharge Diagnoses:  Active Problems:   Sickle cell crisis [facility])  Discharge Condition: Stable  Disposition: Discharged Home in stable condition  Diet: Regular  Wt Readings from Last 3 Encounters:  05/24/16 56.7 kg (125 lb)  05/24/16 56.7 kg (125 lb)  05/12/16 56.7 kg (125 lb)   History of present illness:   Brittany Foster is a 31 y.o. female with medical history significant of sickle cell anemia. Patient presents to the ED at [facility] with c/o sickle cell pain crisis. Symptoms onset a couple of days ago, persistent, severe. Located in low back and B legs and B arms. Has had nausea but no vomiting. No CP no SOB.  ED Course: Given PO then IV narcotics in ED, pain persists so patient was transferred to [facility].  Hospital Course:  Patient was admitted with Hb Sickle Cell Disease with Crisis in a setting of chronic pain and narcotic dependence, and Anemia of Chronic Disease. She was managed with IVF, Toradol,  Dilaudid via PCA and continued on Chronic pain medications. Her pain decreased to the point of functional ability where she was able to ambulate without any assistance and without any increase in HR. She is discharged home in hemodynamically stable condition.   Disposition and Follow-up: Pt is discharged home in good condition. She has agreed to attend a 90-day residential rehabilitation and detox program starting from June 02, 2016. She appears motivated and excited and she clearly understand that this is the last option for [place] to continue physician-patient relationship. Patient claims she is ready to turn a new leaf and take charge of her life. She promised to be complete the program and can't wait to get started.   Discharge Exam: Vitals:   05/30/16 0951 05/30/16 1000  BP:  (!) 102/53  Pulse:  (!) 111  Resp: 13 19   Temp:     Vitals:   05/30/16 0400 05/30/16 0422 05/30/16 0951 05/30/16 1000  BP:  102/61  (!) 102/53  Pulse:  87  (!) 111  Resp: Temp:      TempSrc:  Oral    SpO2: 97% 100% 95% 95%  Weight:      Height:       General appearance : Awake, alert, not in any distress. Speech Clear. Not toxic looking HEENT: Atraumatic and Normocephalic, pupils equally reactive to light and accomodation Neck: Supple, no JVD. No cervical lymphadenopathy.  Chest: Good air entry bilaterally, no added sounds  CVS: S1 S2 regular, no murmurs.  Abdomen: Bowel sounds present, Non tender and not distended with no gaurding, rigidity or rebound. Extremities: B/L Lower Ext shows no edema, both legs are warm to touch Neurology: Awake alert, and oriented X 3, CN II-XII intact, Non focal Skin: No Rash  Discharge Instructions  Discharge Instructions    Diet - low sodium heart healthy    Complete by:  As directed    Increase activity slowly    Complete by:  As directed      Allergies as of 05/30/2016      Reactions   Ultram [tramadol] Other (See Comments)   seizures   Zofran [ondansetron Hcl] Nausea And Vomiting   Buprenorphine Hcl Hives, Rash   Shaking Tolerates Percocet, Norco, and buprenorphine   Fentanyl Hives   Morphine And Related Hives, Rash, Other (See Comments)  Shaking Tolerates Percocet, Norco, Dilaudid, and buprenorphine   Tape Rash      Medication List    TAKE these medications   ARIPiprazole 10 MG tablet Commonly known as:  ABILIFY Take 10 mg by mouth daily   diphenhydrAMINE 25 MG tablet Commonly known as:  BENADRYL Take 50 mg by mouth every 6 (six) hours as needed for itching or allergies.   DULoxetine 60 MG capsule Commonly known as:  CYMBALTA TAKE 1 CAPSULE BY MOUTH DAILY. What changed:  See the new instructions.   folic acid 1 MG tablet Commonly known as:  FOLVITE Take 1 tablet (1 mg total) by mouth daily.   hydroxyurea 500 MG capsule Commonly known as:   HYDREA TAKE 2 CAPSULES BY MOUTH DAILY. MAY TAKE WITH FOOD TO MINIMIZE GI SIDE EFFECTS. What changed:  how much to take  how to take this  when to take this  additional instructions   ibuprofen 200 MG tablet Commonly known as:  ADVIL,MOTRIN Take 400 mg by mouth every 6 (six) hours as needed for moderate pain.   L-glutamine 5 g Pack Powder Packet Commonly known as:  ENDARI Take 10 g by mouth 2 (two) times daily.   medroxyPROGESTERone 150 MG/ML injection Commonly known as:  DEPO-PROVERA Inject 150 mg into the muscle every 3 (three) months.   morphine 30 MG 12 hr tablet Commonly known as:  MS CONTIN Take 1 tablet (30 mg total) by mouth every 12 (twelve) hours.   oxyCODONE-acetaminophen 10-325 MG tablet Commonly known as:  PERCOCET Take 1 tablet by mouth every 4 (four) hours as needed for pain.   Topiramate ER 100 MG Cp24 Take 100 mg by mouth at bedtime.        The results of significant diagnostics from this hospitalization (including imaging, microbiology, ancillary and laboratory) are listed below for reference.    Significant Diagnostic Studies: No results found.  Microbiology: Recent Results (from the past 240 hour(s))  Urine culture     Status: Abnormal   Collection Time: 05/24/16  1:28 PM  Result Value Ref Range Status   Specimen Description URINE, RANDOM  Final   Special Requests NONE  Final   Culture (A)  Final    <10,000 COLONIES/mL INSIGNIFICANT GROWTH Performed at [facility], [address], [place], [place] [address]    Report Status 05/26/2016 FINAL  Final     Labs: Basic Metabolic Panel:  Recent Labs Lab 05/24/16 1141  NA 139  K 3.6  CL 112*  CO2 22  GLUCOSE 135*  BUN 11  CREATININE 0.39*  CALCIUM 8.9   Liver Function Tests: No results for input(s): AST, ALT, ALKPHOS, BILITOT, PROT, ALBUMIN in the last 168 hours. No results for input(s): LIPASE, AMYLASE in the last 168 hours. No results for input(s): AMMONIA in the last 168  hours. CBC:  Recent Labs Lab 05/24/16 1141 05/27/16 0421 05/28/16 1133  WBC 13.8* 12.9* 12.8*  NEUTROABS 8.5* 6.8 7.1  HGB 9.4* 7.1* 7.2*  HCT 27.5* 20.2* 20.5*  MCV 96.3 93.1 94.5  PLT 319 328 352   Cardiac Enzymes: No results for input(s): CKTOTAL, CKMB, CKMBINDEX, TROPONINI in the last 168 hours. BNP: Invalid input(s): POCBNP CBG: No results for input(s): GLUCAP in the last 168 hours.  Time coordinating discharge: 50 minutes  Signed:  Jeanann Lewandowsky  [facility] 05/30/2016, 12:03 PM

## 2016-05-30 NOTE — Progress Notes (Signed)
Pt with PICC access (chest).  Per pt she has had this for 9 months and it does not come out.  RN paged IV team for consult and to flush, however pt refused to wait for them.  She left without seeing IV RN.  PICC dressing was clean, dry and intact.

## 2016-06-16 ENCOUNTER — Telehealth (HOSPITAL_COMMUNITY): Payer: Self-pay | Admitting: Hematology

## 2016-06-16 NOTE — Telephone Encounter (Signed)
Called and spoke with patient regarding her leaving the treatment facility.  Explained again the purpose of the rehab facility, resetting pain receptors so that it doesn't require so much narcotic pain medication to treat sickle cell crisis.  Patient stated she would not be going to rehab, and has left the facility.  Patient instructed per provider that she has 30 days to find another PCP.  Patient verbalized understanding and stated she didn't want Dr. Hyman Hopes as her PCP anymore anyway.  Patient was advised that medications and acute treatment will be handled until 07/16/2016.  Patient verbalizes understanding.  Patient was given contact information for the sickle cell agency to follow up about some other concerns she has.

## 2016-06-17 ENCOUNTER — Telehealth: Payer: Self-pay

## 2016-06-18 ENCOUNTER — Telehealth (HOSPITAL_COMMUNITY): Payer: Self-pay | Admitting: Hematology

## 2016-06-18 ENCOUNTER — Telehealth (HOSPITAL_COMMUNITY): Payer: Self-pay | Admitting: *Deleted

## 2016-06-18 ENCOUNTER — Other Ambulatory Visit: Payer: Self-pay | Admitting: Internal Medicine

## 2016-06-18 DIAGNOSIS — D5700 Hb-SS disease with crisis, unspecified: Secondary | ICD-10-CM

## 2016-06-18 MED ORDER — OXYCODONE-ACETAMINOPHEN 10-325 MG PO TABS: 1.0000 | ORAL_TABLET | ORAL | 0 refills | Status: DC | PRN

## 2016-06-18 MED ORDER — MORPHINE SULFATE ER 30 MG PO TBCR: 30.0000 mg | EXTENDED_RELEASE_TABLET | Freq: Two times a day (BID) | ORAL | 0 refills | Status: DC

## 2016-06-18 NOTE — Telephone Encounter (Signed)
Pt called to see if Dr. Hyman Hopes received her message concerning her prescription.  Pt was told that message was received and she can not pick up prescription until Tuesday, April 24.

## 2016-06-18 NOTE — Telephone Encounter (Signed)
Patient has called several times in regards to her prescription.  Spoke with patient and advised that Oxycodone prescription will be available for pickup on tomorrow.  Also advised patient that she will be due one more prescription before 07/16/2016.  By this time she will need to have established care with another provider.  Patient verbalizes that she has been in contact with a hematologist to care for her sickle cell disease.

## 2016-06-22 ENCOUNTER — Non-Acute Institutional Stay (HOSPITAL_COMMUNITY)
Admission: AD | Admit: 2016-06-22 | Discharge: 2016-06-22 | Disposition: A | Discharge status: Home / Self Care | Payer: Medicaid Other | Source: Ambulatory Visit | Attending: Internal Medicine | Admitting: Internal Medicine

## 2016-06-22 ENCOUNTER — Encounter (HOSPITAL_COMMUNITY): Payer: Self-pay | Admitting: *Deleted

## 2016-06-22 ENCOUNTER — Telehealth: Payer: Self-pay | Admitting: Internal Medicine

## 2016-06-22 DIAGNOSIS — Z79899 Other long term (current) drug therapy: Secondary | ICD-10-CM | POA: Diagnosis not present

## 2016-06-22 DIAGNOSIS — Z9851 Tubal ligation status: Secondary | ICD-10-CM | POA: Insufficient documentation

## 2016-06-22 DIAGNOSIS — D5700 Hb-SS disease with crisis, unspecified: Secondary | ICD-10-CM | POA: Diagnosis present

## 2016-06-22 DIAGNOSIS — Z79891 Long term (current) use of opiate analgesic: Secondary | ICD-10-CM | POA: Diagnosis not present

## 2016-06-22 DIAGNOSIS — M549 Dorsalgia, unspecified: Secondary | ICD-10-CM | POA: Diagnosis not present

## 2016-06-22 DIAGNOSIS — Z87891 Personal history of nicotine dependence: Secondary | ICD-10-CM | POA: Diagnosis not present

## 2016-06-22 DIAGNOSIS — D571 Sickle-cell disease without crisis: Secondary | ICD-10-CM | POA: Insufficient documentation

## 2016-06-22 DIAGNOSIS — Z791 Long term (current) use of non-steroidal anti-inflammatories (NSAID): Secondary | ICD-10-CM | POA: Insufficient documentation

## 2016-06-22 LAB — COMPREHENSIVE METABOLIC PANEL
ALBUMIN: 4.2 g/dL (ref 3.5–5.0)
ALT: 37 U/L (ref 14–54)
ANION GAP: 6 (ref 5–15)
AST: 56 U/L — ABNORMAL HIGH (ref 15–41)
Alkaline Phosphatase: 87 U/L (ref 38–126)
BUN: 11 mg/dL (ref 6–20)
CHLORIDE: 112 mmol/L — AB (ref 101–111)
CO2: 21 mmol/L — AB (ref 22–32)
Calcium: 8.6 mg/dL — ABNORMAL LOW (ref 8.9–10.3)
Creatinine, Ser: 0.37 mg/dL — ABNORMAL LOW (ref 0.44–1.00)
GFR calc non Af Amer: >60 mL/min (ref >60)
GLUCOSE: 82 mg/dL (ref 65–99)
Potassium: 3.5 mmol/L (ref 3.5–5.1)
SODIUM: 139 mmol/L (ref 135–145)
Total Bilirubin: 2.1 mg/dL — ABNORMAL HIGH (ref 0.3–1.2)
Total Protein: 7.6 g/dL (ref 6.5–8.1)

## 2016-06-22 LAB — RETICULOCYTES
RBC.: 2.9 MIL/uL — ABNORMAL LOW (ref 3.87–5.11)
RETIC COUNT ABSOLUTE: 388.6 10*3/uL — AB (ref 19.0–186.0)
RETIC CT PCT: 13.4 % — AB (ref 0.4–3.1)

## 2016-06-22 LAB — CBC WITH DIFFERENTIAL/PLATELET
BASOS PCT: 1 %
Basophils Absolute: 0.1 10*3/uL (ref 0.0–0.1)
EOS ABS: 0.3 10*3/uL (ref 0.0–0.7)
EOS PCT: 2 %
HCT: 26.5 % — ABNORMAL LOW (ref 36.0–46.0)
HEMOGLOBIN: 9 g/dL — AB (ref 12.0–15.0)
LYMPHS PCT: 28 %
Lymphs Abs: 3.6 10*3/uL (ref 0.7–4.0)
MCH: 31 pg (ref 26.0–34.0)
MCHC: 34 g/dL (ref 30.0–36.0)
MCV: 91.4 fL (ref 78.0–100.0)
MONO ABS: 1.3 10*3/uL — AB (ref 0.1–1.0)
Monocytes Relative: 10 %
NEUTROS PCT: 59 %
Neutro Abs: 7.7 10*3/uL (ref 1.7–7.7)
PLATELETS: 392 10*3/uL (ref 150–400)
RBC: 2.9 MIL/uL — AB (ref 3.87–5.11)
RDW: 21.3 % — ABNORMAL HIGH (ref 11.5–15.5)
WBC: 13 10*3/uL — AB (ref 4.0–10.5)

## 2016-06-22 MED ORDER — ONDANSETRON HCL 4 MG/2ML IJ SOLN: 4.0000 mg | Freq: Four times a day (QID) | INTRAMUSCULAR | Status: DC | PRN

## 2016-06-22 MED ORDER — DIPHENHYDRAMINE HCL 50 MG/ML IJ SOLN
25.0000 mg | INTRAMUSCULAR | Status: DC | PRN
  Filled 2016-06-22: qty 0.5

## 2016-06-22 MED ORDER — DEXTROSE-NACL 5-0.45 % IV SOLN
INTRAVENOUS | Status: DC
  Administered 2016-06-22: 11:00:00 via INTRAVENOUS

## 2016-06-22 MED ORDER — SODIUM CHLORIDE 0.9% FLUSH
10.0000 mL | INTRAVENOUS | Status: AC | PRN
  Administered 2016-06-22: 10 mL

## 2016-06-22 MED ORDER — NALOXONE HCL 0.4 MG/ML IJ SOLN: 0.4000 mg | INTRAMUSCULAR | Status: DC | PRN

## 2016-06-22 MED ORDER — SODIUM CHLORIDE 0.9% FLUSH: 9.0000 mL | INTRAVENOUS | Status: DC | PRN

## 2016-06-22 MED ORDER — HEPARIN SOD (PORK) LOCK FLUSH 100 UNIT/ML IV SOLN
250.0000 [IU] | INTRAVENOUS | Status: AC | PRN
  Administered 2016-06-22: 250 [IU]
  Filled 2016-06-22: qty 5

## 2016-06-22 MED ORDER — DIPHENHYDRAMINE HCL 25 MG PO CAPS: 25.0000 mg | ORAL_CAPSULE | ORAL | Status: DC | PRN

## 2016-06-22 MED ORDER — KETOROLAC TROMETHAMINE 30 MG/ML IJ SOLN
30.0000 mg | Freq: Once | INTRAMUSCULAR | Status: AC
  Administered 2016-06-22: 30 mg via INTRAVENOUS
  Filled 2016-06-22: qty 1

## 2016-06-22 MED ORDER — HYDROMORPHONE 1 MG/ML IV SOLN
INTRAVENOUS | Status: DC
  Administered 2016-06-22: 11:00:00 via INTRAVENOUS
  Administered 2016-06-22: 10.5 mg via INTRAVENOUS
  Filled 2016-06-22: qty 25

## 2016-06-22 NOTE — Telephone Encounter (Signed)
Patient called requesting treatment at the [facility] c/o bilateral leg and back pain. Patient rates pain level 8/10 on pain scale. Patient denies fever, chest pain, vomitting or diarrhea or abdominal pain. Patient report being nauseous. Patient reports taking her home medications Oxycodone and MS Cotin this morning around 7 am with no relief.   Patient placed on a brief hold and provider notified. Patient advised [facility] per Jerrilyn Cairo FNP. Patient states an understanding.

## 2016-06-22 NOTE — Discharge Summary (Signed)
[facility] Discharge Summary   Patient ID: Brittany Foster MRN: [id number] DOB/AGE: 1985-06-29 31 y.o.  Admit date: 06/22/2016 Discharge date: 06/22/2016  Primary Care Physician:  [name] Patient  Admission Diagnoses:  Active Problems:   Hb-SS disease without crisis (HCC)   Sickle cell anemia with pain [facility])    Discharge Medications:  Allergies as of 06/22/2016      Reactions   Ultram [tramadol] Other (See Comments)   seizures   Zofran [ondansetron Hcl] Nausea And Vomiting   Buprenorphine Hcl Hives, Rash   Shaking Tolerates Percocet, Norco, and buprenorphine   Fentanyl Hives   Morphine And Related Hives, Rash, Other (See Comments)   Shaking Tolerates Percocet, Norco, Dilaudid, and buprenorphine   Tape Rash      Medication List    TAKE these medications   ARIPiprazole 10 MG tablet Commonly known as:  ABILIFY Take 10 mg by mouth daily   diphenhydrAMINE 25 MG tablet Commonly known as:  BENADRYL Take 50 mg by mouth every 6 (six) hours as needed for itching or allergies.   DULoxetine 60 MG capsule Commonly known as:  CYMBALTA TAKE 1 CAPSULE BY MOUTH DAILY. What changed:  See the new instructions.   folic acid 1 MG tablet Commonly known as:  FOLVITE Take 1 tablet (1 mg total) by mouth daily.   hydroxyurea 500 MG capsule Commonly known as:  HYDREA TAKE 2 CAPSULES BY MOUTH DAILY. MAY TAKE WITH FOOD TO MINIMIZE GI SIDE EFFECTS. What changed:  how much to take  how to take this  when to take this  additional instructions   ibuprofen 200 MG tablet Commonly known as:  ADVIL,MOTRIN Take 400 mg by mouth every 6 (six) hours as needed for moderate pain.   L-glutamine 5 g Pack Powder Packet Commonly known as:  ENDARI Take 10 g by mouth 2 (two) times daily.   medroxyPROGESTERone 150 MG/ML injection Commonly known as:  DEPO-PROVERA Inject 150 mg into the muscle every 3 (three) months.   morphine 30 MG 12 hr tablet Commonly known as:  MS  CONTIN Take 1 tablet (30 mg total) by mouth every 12 (twelve) hours.   oxyCODONE-acetaminophen 10-325 MG tablet Commonly known as:  PERCOCET Take 1 tablet by mouth every 4 (four) hours as needed for pain.   Topiramate ER 100 MG Cp24 Take 100 mg by mouth at bedtime.        Consults:  N/A  Significant Diagnostic Studies:  Results for orders placed or performed during the hospital encounter of 06/22/16  CBC with Differential/Platelet  Result Value Ref Range   WBC 13.0 (H) 4.0 - 10.5 K/uL   RBC 2.90 (L) 3.87 - 5.11 MIL/uL   Hemoglobin 9.0 (L) 12.0 - 15.0 g/dL   HCT 91.4 (L) 78.2 - 95.6 %   MCV 91.4 78.0 - 100.0 fL   MCH 31.0 26.0 - 34.0 pg   MCHC 34.0 30.0 - 36.0 g/dL   RDW 21.3 (H) 08.6 - 57.8 %   Platelets 392 150 - 400 K/uL   Neutrophils Relative % 59 %   Lymphocytes Relative 28 %   Monocytes Relative 10 %   Eosinophils Relative 2 %   Basophils Relative 1 %   Neutro Abs 7.7 1.7 - 7.7 K/uL   Lymphs Abs 3.6 0.7 - 4.0 K/uL   Monocytes Absolute 1.3 (H) 0.1 - 1.0 K/uL   Eosinophils Absolute 0.3 0.0 - 0.7 K/uL   Basophils Absolute 0.1 0.0 - 0.1 K/uL  RBC Morphology POLYCHROMASIA PRESENT   Reticulocytes  Result Value Ref Range   Retic Ct Pct 13.4 (H) 0.4 - 3.1 %   RBC. 2.90 (L) 3.87 - 5.11 MIL/uL   Retic Count, Manual 388.6 (H) 19.0 - 186.0 K/uL  Comprehensive metabolic panel  Result Value Ref Range   Sodium 139 135 - 145 mmol/L   Potassium 3.5 3.5 - 5.1 mmol/L   Chloride 112 (H) 101 - 111 mmol/L   CO2 21 (L) 22 - 32 mmol/L   Glucose, Bld 82 65 - 99 mg/dL   BUN 11 6 - 20 mg/dL   Creatinine, Ser 1.61 (L) 0.44 - 1.00 mg/dL   Calcium 8.6 (L) 8.9 - 10.3 mg/dL   Total Protein 7.6 6.5 - 8.1 g/dL   Albumin 4.2 3.5 - 5.0 g/dL   AST 56 (H) 15 - 41 U/L   ALT 37 14 - 54 U/L   Alkaline Phosphatase 87 38 - 126 U/L   Total Bilirubin 2.1 (H) 0.3 - 1.2 mg/dL   GFR calc non Af Amer >60 >60 mL/min   GFR calc Af Amer >60 >60 mL/min   Anion gap 6 5 - 15     [facility] Course: Brittany Foster is a 31 y.o. female with a diagnosis of Sickle Cell Anemia presents today with a complaint of low back and bilateral leg pain x 2 days consistent with previous sickle cell pain crisis episodes. Here pain on arrival 10/10. Patient was admitted to the day infusion center for extended observation. She received cellular rehydration with D5.45 @ 125 cc/hr. Toradol 30 mg, IV was administered for inflammation. High Concentration PCA per weight based protocol for pain control. Patient used a total of 10.5 mg with 21 demands and 21 deliveries. Patient states that pain intensity is 6/10, reduced from 10/10 on admission. Reviewed laboratory values, consistent with baseline. She maintains that/she can function at home on current medication regimen.  Patient is alert, oriented and ambulatory.   Physical Exam at Discharge:  BP 110/68 (BP Location: Right Arm)   Pulse 92   Temp 98 F (36.7 C) (Oral)   Resp 13   Ht 5' (1.524 m)   Wt 129 lb 12.8 oz (58.9 kg)   SpO2 93%   BMI 25.35 kg/m   General Appearance:    Alert, cooperative, no distress, appears stated age Head:    Normocephalic, without obvious abnormality, atraumatic Eyes:    PERRL, conjunctiva/corneas clear, EOM's intact, fundi     benign, both eyes Back:     Symmetric, no curvature, ROM normal, no CVA tenderness Lungs:  Clear to auscultation bilaterally, respirations unlabored Chest Wall: No tenderness or deformity Heart:    Regular rate and rhythm, S1 and S2 normal, no murmur, rub   or gallop Abdomen:  Soft, non-tender, bowel sounds active all four quadrants,      no masses, no organomegaly Extremities: Extremities normal, atraumatic, no cyanosis or edema Pulses:   2+ and symmetric all extremities Skin:   Skin color, texture, turgor normal, no rashes or lesions Neurologic: Normal strength    Disposition at Discharge: 01-Home or Self Care  Discharge Orders:  -Continue to hydrate and take prescribed home  medications as ordered. -Resume all home medications. -Keep or schedule an upcoming appointment with your primary care provider -The patient was given clear instructions to go to ER or return to medical center if symptoms do not improve, worsen or new problems develop. The patient verbalized understanding.  Condition at Discharge:   Stable  Time spent on Discharge:  15 minutes   Signed: Joaquin Courts 06/22/2016, 3:01 PM

## 2016-06-22 NOTE — Progress Notes (Signed)
Patient admitted to the [facility] c/o bilateral leg and lower back pain 8/10 on pain scale. Patient was placed on a Dilaudid PCA and treated with IV fluids and IV Toradol. At time of discharge patient's pain was down to 6/10 on pain scale. Discharge instructions given to patient and patient states an understanding. Patient alert, oriented, and ambulatory at time of discharge.

## 2016-06-22 NOTE — Telephone Encounter (Signed)
Patient verified DOB Patients states she did not leave the Rehab facility. Patient states after completing her intake process, the staff took her to the [facility] ED where she was admitted. Patient was told to return to the facility once she was released and was admitted for 3 weeks. The rehab facility told the patient they were unable to hold her place. Patient states she is attempting to re-enroll in the program and will speak with Ms. Mindi Junker to assist in facilitating the process again.

## 2016-06-22 NOTE — H&P (Signed)
[facility] History and Physical   Date: 06/22/2016  Patient name: Brittany Foster Medical record number: [id number] Date of birth: [birth year] Age: 31 y.o. Gender: female PCP: [name] Patient  Attending physician: Quentin Angst, MD  Chief Complaint: Pain  History of Present Illness:   Brittany Foster is a 31 y.o. female with a diagnosis of Sickle Cell Anemia presents today with a complaint of low back and bilateral leg pain x 2 days consistent with previous sickle cell pain crisis episodes. Last hospital admission 06/02/2016 for pneumonia and sickle cell related crisis. She has a history of frequent ED visits.  Reports lower leg and back pain 10/10. Characterizes pain aching and throbbing. She report some mid epigastric pain with nausea the last two days without vomiting.  She last took home medication of MS Contin and oxycodone at 7:00 am today without relief of symptoms. Patient denies headache, fever, shortness of breath, chest pain, dysuria, nausea, vomiting, or diarrhea.     CBC Latest Ref Rng & Units 05/28/2016 05/27/2016 05/24/2016  WBC 4.0 - 10.5 K/uL 12.8(H) 12.9(H) 13.8(H)  Hemoglobin 12.0 - 15.0 g/dL 7.2(L) 7.1(L) 9.4(L)  Hematocrit 36.0 - 46.0 % 20.5(L) 20.2(L) 27.5(L)  Platelets 150 - 400 K/uL 352 328 319      Meds: Prescriptions Prior to Admission  Medication Sig Dispense Refill Last Dose  . ARIPiprazole (ABILIFY) 10 MG tablet Take 10 mg by mouth daily  3 06/21/2016 at Unknown time  . DULoxetine (CYMBALTA) 60 MG capsule TAKE 1 CAPSULE BY MOUTH DAILY. (Patient taking differently: TAKE 60 MG BY MOUTH DAILY) 30 capsule 3 06/21/2016 at Unknown time  . folic acid (FOLVITE) 1 MG tablet Take 1 tablet (1 mg total) by mouth daily. 90 tablet 3 06/21/2016 at Unknown time  . hydroxyurea (HYDREA) 500 MG capsule TAKE 2 CAPSULES BY MOUTH DAILY. MAY TAKE WITH FOOD TO MINIMIZE GI SIDE EFFECTS. (Patient taking differently: Take 500 mg by mouth daily. ) 180 capsule 3 06/21/2016  at Unknown time  . L-glutamine (ENDARI) 5 g PACK Powder Packet Take 10 g by mouth 2 (two) times daily. 60 packet 3 Past Month at Unknown time  . morphine (MS CONTIN) 30 MG 12 hr tablet Take 1 tablet (30 mg total) by mouth every 12 (twelve) hours. 60 tablet 0 06/22/2016 at Unknown time  . oxyCODONE-acetaminophen (PERCOCET) 10-325 MG tablet Take 1 tablet by mouth every 4 (four) hours as needed for pain. 90 tablet 0 06/22/2016 at Unknown time  . Topiramate ER 100 MG CP24 Take 100 mg by mouth at bedtime.   Past Month at Unknown time  . diphenhydrAMINE (BENADRYL) 25 MG tablet Take 50 mg by mouth every 6 (six) hours as needed for itching or allergies.   Unknown at Unknown time  . ibuprofen (ADVIL,MOTRIN) 200 MG tablet Take 400 mg by mouth every 6 (six) hours as needed for moderate pain.   Unknown at Unknown time  . medroxyPROGESTERone (DEPO-PROVERA) 150 MG/ML injection Inject 150 mg into the muscle every 3 (three) months.    More than a month at Unknown time    Allergies: Ultram [tramadol]; Zofran [ondansetron hcl]; Buprenorphine hcl; Fentanyl; Morphine and related; and Tape Past Medical History:  Diagnosis Date  . Anemia   . Depression, major, recurrent (HCC)   . Migraines   . Sickle cell anemia (HCC)    Past Surgical History:  Procedure Laterality Date  . CESAREAN SECTION    . CHOLECYSTECTOMY  2000  . IR GENERIC  HISTORICAL  10/08/2015   IR US GUIDE VASC ACCESS RIGHT 10/08/2015 Simonne Come, MD [facility]  . IR GENERIC HISTORICAL  10/08/2015   IR FLUORO GUIDE CV LINE RIGHT 10/08/2015 Simonne Come, MD [facility]  . MULTIPLE TOOTH EXTRACTIONS N/A   . port a cath placement Right    about 6-7 years ago  . removal of porta cath Right 09/11/15  . TUBAL LIGATION     Family History  Problem Relation Age of Onset  . Sickle cell trait Father   . Sickle cell trait Mother   . Sickle cell anemia Other    Social History   Social History  . Marital status: Single    Spouse name: N/A  . Number of  children: N/A  . Years of education: N/A   Occupational History  . None    Social History Main Topics  . Smoking status: Former Games developer  . Smokeless tobacco: Never Used  . Alcohol use No  . Drug use: No  . Sexual activity: Yes    Partners: Male    Birth control/ protection: Injection     Comment: 1 new partner recently, no concern about STI, uses condoms   Other Topics Concern  . Not on file   Social History Narrative   81 year old daughter    Review of Systems: A comprehensive review of systems was negative  Respiratory: Negative  Cardiovascular: Negative  Gastrointestinal: Negative  Musculoskeletal: positive pain   Physical Exam: Blood pressure 114/75, pulse 97, temperature 98 F (36.7 C), temperature source Oral, resp. rate 18, height 5' (1.524 m), weight 129 lb 12.8 oz (58.9 kg), SpO2 98 %. BP 114/75 (BP Location: Right Arm, Patient Position: Sitting)   Pulse 97   Temp 98 F (36.7 C) (Oral)   Resp 13   Ht 5' (1.524 m)   Wt 129 lb 12.8 oz (58.9 kg)   SpO2 96%   BMI 25.35 kg/m   General Appearance:    Alert, cooperative, no distress, appears stated age  Head:    Normocephalic, without obvious abnormality, atraumatic  Eyes:    PERRL, conjunctiva/corneas clear, EOM's intact, fundi    benign, both eyes  Back:     Symmetric, no curvature, ROM normal, no CVA tenderness  Lungs:     Clear to auscultation bilaterally, respirations unlabored  Chest Wall:    No tenderness or deformity   Heart:    Regular rate and rhythm, S1 and S2 normal, no murmur, rub   or gallop  Abdomen:     Soft, non-tender, bowel sounds active all four quadrants,    no masses, no organomegaly  Extremities:   Extremities normal, atraumatic, no cyanosis or edema  Pulses:   2+ and symmetric all extremities  Skin:   Skin color, texture, turgor normal, no rashes or lesions  Neurologic:   Normal strength        Lab results:    Assessment & Plan:   Patient will be admitted to the day infusion  center for extended observation  Start IV D5.45 for cellular rehydration at 125/hr  2 Liters of Oxygen via nasal cannula   Start Toradol 30 mg IV every 6 hours for inflammation.  Start Dilaudid PCA High Concentration per weight based protocol.   Patient will be re-evaluated for pain intensity in the context of function and relationship to baseline as care progresses.  If no significant pain relief, will transfer patient to inpatient services for a higher level of care.  Will check CMP,  Reticulocyte, and CBC w/differential   Joaquin Courts 06/22/2016, 10:08 AM

## 2016-06-22 NOTE — Telephone Encounter (Signed)
Pt requesting to speak to Dr Hyman Hopes or RN Red Christians regarding treatment. Pt of Sickle Cell. Please f/u with pt.

## 2016-06-22 NOTE — Discharge Instructions (Signed)
Sickle Cell Anemia, Adult °Sickle cell anemia is a condition where your red blood cells are shaped like sickles. Red blood cells carry oxygen through the body. Sickle-shaped red blood cells do not live as long as normal red blood cells. They also clump together and block blood from flowing through the blood vessels. These things prevent the body from getting enough oxygen. Sickle cell anemia causes organ damage and pain. It also increases the risk of infection. °Follow these instructions at home: °· Drink enough fluid to keep your pee (urine) clear or pale yellow. Drink more in hot weather and during exercise. °· Do not smoke. Smoking lowers oxygen levels in the blood. °· Only take over-the-counter or prescription medicines as told by your doctor. °· Take antibiotic medicines as told by your doctor. Make sure you finish them even if you start to feel better. °· Take supplements as told by your doctor. °· Consider wearing a medical alert bracelet. This tells anyone caring for you in an emergency of your condition. °· When traveling, keep your medical information, doctors' names, and the medicines you take with you at all times. °· If you have a fever, do not take fever medicines right away. This could cover up a problem. Tell your doctor. °· Keep all follow-up visits with your doctor. Sickle cell anemia requires regular medical care. °Contact a doctor if: °You have a fever. °Get help right away if: °· You feel dizzy or faint. °· You have new belly (abdominal) pain, especially on the left side near the stomach area. °· You have a lasting, often uncomfortable and painful erection of the penis (priapism). If it is not treated right away, you will become unable to have sex (impotence). °· You have numbness in your arms or legs or you have a hard time moving them. °· You have a hard time talking. °· You have a fever or lasting symptoms for more than 2-3 days. °· You have a fever and your symptoms suddenly get  worse. °· You have signs or symptoms of infection. These include: °? Chills. °? Being more tired than normal (lethargy). °? Irritability. °? Poor eating. °? Throwing up (vomiting). °· You have pain that is not helped with medicine. °· You have shortness of breath. °· You have pain in your chest. °· You are coughing up pus-like or bloody mucus. °· You have a stiff neck. °· Your feet or hands swell or have pain. °· Your belly looks bloated. °· Your joints hurt. °This information is not intended to replace advice given to you by your health care provider. Make sure you discuss any questions you have with your health care provider. °Document Released: 12/07/2012 Document Revised: 07/25/2015 Document Reviewed: 09/28/2012 °Elsevier Interactive Patient Education © 2017 [employer] ° °

## 2016-07-06 ENCOUNTER — Telehealth: Payer: Self-pay

## 2016-07-07 ENCOUNTER — Other Ambulatory Visit: Payer: Self-pay | Admitting: Internal Medicine

## 2016-07-07 DIAGNOSIS — D5700 Hb-SS disease with crisis, unspecified: Secondary | ICD-10-CM

## 2016-07-07 MED ORDER — OXYCODONE-ACETAMINOPHEN 10-325 MG PO TABS
1.0000 | ORAL_TABLET | ORAL | 0 refills | Status: AC | PRN
Start: 2016-07-07 — End: 2016-07-22

## 2016-07-09 ENCOUNTER — Non-Acute Institutional Stay (HOSPITAL_COMMUNITY)
Admission: AD | Admit: 2016-07-09 | Discharge: 2016-07-09 | Disposition: A | Discharge status: Home / Self Care | Payer: Medicaid Other | Source: Ambulatory Visit | Attending: Internal Medicine | Admitting: Internal Medicine

## 2016-07-09 ENCOUNTER — Telehealth (HOSPITAL_COMMUNITY): Payer: Self-pay | Admitting: *Deleted

## 2016-07-09 ENCOUNTER — Encounter (HOSPITAL_COMMUNITY): Payer: Self-pay | Admitting: *Deleted

## 2016-07-09 ENCOUNTER — Other Ambulatory Visit: Payer: Self-pay | Admitting: Family Medicine

## 2016-07-09 DIAGNOSIS — M79605 Pain in left leg: Secondary | ICD-10-CM | POA: Diagnosis not present

## 2016-07-09 DIAGNOSIS — Z9851 Tubal ligation status: Secondary | ICD-10-CM | POA: Diagnosis not present

## 2016-07-09 DIAGNOSIS — Z888 Allergy status to other drugs, medicaments and biological substances status: Secondary | ICD-10-CM | POA: Insufficient documentation

## 2016-07-09 DIAGNOSIS — D5700 Hb-SS disease with crisis, unspecified: Secondary | ICD-10-CM | POA: Insufficient documentation

## 2016-07-09 DIAGNOSIS — M79604 Pain in right leg: Secondary | ICD-10-CM | POA: Insufficient documentation

## 2016-07-09 DIAGNOSIS — Z87891 Personal history of nicotine dependence: Secondary | ICD-10-CM | POA: Insufficient documentation

## 2016-07-09 DIAGNOSIS — Z832 Family history of diseases of the blood and blood-forming organs and certain disorders involving the immune mechanism: Secondary | ICD-10-CM | POA: Diagnosis not present

## 2016-07-09 DIAGNOSIS — Z885 Allergy status to narcotic agent status: Secondary | ICD-10-CM | POA: Diagnosis not present

## 2016-07-09 LAB — COMPREHENSIVE METABOLIC PANEL
ALBUMIN: 4.7 g/dL (ref 3.5–5.0)
ALK PHOS: 102 U/L (ref 38–126)
ALT: 42 U/L (ref 14–54)
ANION GAP: 8 (ref 5–15)
AST: 67 U/L — AB (ref 15–41)
BUN: 8 mg/dL (ref 6–20)
CALCIUM: 9.2 mg/dL (ref 8.9–10.3)
CO2: 20 mmol/L — ABNORMAL LOW (ref 22–32)
Chloride: 111 mmol/L (ref 101–111)
Creatinine, Ser: 0.38 mg/dL — ABNORMAL LOW (ref 0.44–1.00)
GFR calc Af Amer: >60 mL/min (ref >60)
GFR calc non Af Amer: >60 mL/min (ref >60)
GLUCOSE: 128 mg/dL — AB (ref 65–99)
POTASSIUM: 3.3 mmol/L — AB (ref 3.5–5.1)
SODIUM: 139 mmol/L (ref 135–145)
Total Bilirubin: 2.4 mg/dL — ABNORMAL HIGH (ref 0.3–1.2)
Total Protein: 8.4 g/dL — ABNORMAL HIGH (ref 6.5–8.1)

## 2016-07-09 LAB — CBC WITH DIFFERENTIAL/PLATELET
BASOS ABS: 0 10*3/uL (ref 0.0–0.1)
Basophils Relative: 0 %
EOS ABS: 0 10*3/uL (ref 0.0–0.7)
Eosinophils Relative: 0 %
HCT: 26.1 % — ABNORMAL LOW (ref 36.0–46.0)
Hemoglobin: 9 g/dL — ABNORMAL LOW (ref 12.0–15.0)
LYMPHS ABS: 2 10*3/uL (ref 0.7–4.0)
LYMPHS PCT: 16 %
MCH: 32.3 pg (ref 26.0–34.0)
MCHC: 34.5 g/dL (ref 30.0–36.0)
MCV: 93.5 fL (ref 78.0–100.0)
MONO ABS: 1 10*3/uL (ref 0.1–1.0)
MONOS PCT: 8 %
Neutro Abs: 9.3 10*3/uL — ABNORMAL HIGH (ref 1.7–7.7)
Neutrophils Relative %: 76 %
PLATELETS: 415 10*3/uL — AB (ref 150–400)
RBC: 2.79 MIL/uL — AB (ref 3.87–5.11)
RDW: 21.9 % — AB (ref 11.5–15.5)
WBC: 12.3 10*3/uL — ABNORMAL HIGH (ref 4.0–10.5)

## 2016-07-09 LAB — RETICULOCYTES
RBC.: 2.79 MIL/uL — AB (ref 3.87–5.11)
RETIC COUNT ABSOLUTE: 399 10*3/uL — AB (ref 19.0–186.0)
RETIC CT PCT: 14.3 % — AB (ref 0.4–3.1)

## 2016-07-09 MED ORDER — SODIUM CHLORIDE 0.9 % IV SOLN
25.0000 mg | INTRAVENOUS | Status: DC | PRN
  Filled 2016-07-09: qty 0.5

## 2016-07-09 MED ORDER — SODIUM CHLORIDE 0.9% FLUSH: 9.0000 mL | INTRAVENOUS | Status: DC | PRN

## 2016-07-09 MED ORDER — ONDANSETRON HCL 4 MG/2ML IJ SOLN
4.0000 mg | Freq: Four times a day (QID) | INTRAMUSCULAR | Status: DC | PRN
  Filled 2016-07-09: qty 2

## 2016-07-09 MED ORDER — KETOROLAC TROMETHAMINE 30 MG/ML IJ SOLN
30.0000 mg | Freq: Once | INTRAMUSCULAR | Status: AC
  Administered 2016-07-09: 30 mg via INTRAVENOUS
  Filled 2016-07-09: qty 1

## 2016-07-09 MED ORDER — SODIUM CHLORIDE 0.9% FLUSH
10.0000 mL | INTRAVENOUS | Status: AC | PRN
  Administered 2016-07-09: 10 mL

## 2016-07-09 MED ORDER — NALOXONE HCL 0.4 MG/ML IJ SOLN: 0.4000 mg | INTRAMUSCULAR | Status: DC | PRN

## 2016-07-09 MED ORDER — HYDROMORPHONE 1 MG/ML IV SOLN
INTRAVENOUS | Status: DC
  Administered 2016-07-09: 11:00:00 via INTRAVENOUS
  Filled 2016-07-09: qty 25

## 2016-07-09 MED ORDER — POTASSIUM CHLORIDE ER 20 MEQ PO TBCR: 20.0000 meq | EXTENDED_RELEASE_TABLET | Freq: Every day | ORAL | 0 refills | Status: DC

## 2016-07-09 MED ORDER — PROMETHAZINE HCL 25 MG PO TABS
25.0000 mg | ORAL_TABLET | Freq: Once | ORAL | Status: AC
  Administered 2016-07-09: 25 mg via ORAL
  Filled 2016-07-09: qty 1

## 2016-07-09 MED ORDER — HEPARIN SOD (PORK) LOCK FLUSH 100 UNIT/ML IV SOLN
250.0000 [IU] | INTRAVENOUS | Status: AC | PRN
  Administered 2016-07-09: 250 [IU]
  Filled 2016-07-09: qty 5

## 2016-07-09 MED ORDER — HYDROMORPHONE 1 MG/ML IV SOLN
INTRAVENOUS | Status: DC
  Administered 2016-07-09: 14.5 mg via INTRAVENOUS

## 2016-07-09 MED ORDER — HYDROMORPHONE HCL 4 MG/ML IJ SOLN: 1.0000 mg | Freq: Once | INTRAMUSCULAR | Status: DC

## 2016-07-09 MED ORDER — DEXTROSE-NACL 5-0.45 % IV SOLN
INTRAVENOUS | Status: DC
  Administered 2016-07-09: 10:00:00 via INTRAVENOUS

## 2016-07-09 MED ORDER — DIPHENHYDRAMINE HCL 25 MG PO CAPS: 25.0000 mg | ORAL_CAPSULE | ORAL | Status: DC | PRN

## 2016-07-09 NOTE — Discharge Instructions (Signed)
Sickle Cell Anemia, Adult °Sickle cell anemia is a condition where your red blood cells are shaped like sickles. Red blood cells carry oxygen through the body. Sickle-shaped red blood cells do not live as long as normal red blood cells. They also clump together and block blood from flowing through the blood vessels. These things prevent the body from getting enough oxygen. Sickle cell anemia causes organ damage and pain. It also increases the risk of infection. °Follow these instructions at home: °· Drink enough fluid to keep your pee (urine) clear or pale yellow. Drink more in hot weather and during exercise. °· Do not smoke. Smoking lowers oxygen levels in the blood. °· Only take over-the-counter or prescription medicines as told by your doctor. °· Take antibiotic medicines as told by your doctor. Make sure you finish them even if you start to feel better. °· Take supplements as told by your doctor. °· Consider wearing a medical alert bracelet. This tells anyone caring for you in an emergency of your condition. °· When traveling, keep your medical information, doctors' names, and the medicines you take with you at all times. °· If you have a fever, do not take fever medicines right away. This could cover up a problem. Tell your doctor. °· Keep all follow-up visits with your doctor. Sickle cell anemia requires regular medical care. °Contact a doctor if: °You have a fever. °Get help right away if: °· You feel dizzy or faint. °· You have new belly (abdominal) pain, especially on the left side near the stomach area. °· You have a lasting, often uncomfortable and painful erection of the penis (priapism). If it is not treated right away, you will become unable to have sex (impotence). °· You have numbness in your arms or legs or you have a hard time moving them. °· You have a hard time talking. °· You have a fever or lasting symptoms for more than 2-3 days. °· You have a fever and your symptoms suddenly get  worse. °· You have signs or symptoms of infection. These include: °? Chills. °? Being more tired than normal (lethargy). °? Irritability. °? Poor eating. °? Throwing up (vomiting). °· You have pain that is not helped with medicine. °· You have shortness of breath. °· You have pain in your chest. °· You are coughing up pus-like or bloody mucus. °· You have a stiff neck. °· Your feet or hands swell or have pain. °· Your belly looks bloated. °· Your joints hurt. °This information is not intended to replace advice given to you by your health care provider. Make sure you discuss any questions you have with your health care provider. °Document Released: 12/07/2012 Document Revised: 07/25/2015 Document Reviewed: 09/28/2012 °Elsevier Interactive Patient Education © 2017 [employer] ° °

## 2016-07-09 NOTE — Telephone Encounter (Signed)
After speaking with the provider, patient was told that she could come in the day hospital for treatment. Pt voiced understanding and stated she had to find a ride here. She will give us a call if she does not.

## 2016-07-09 NOTE — Discharge Summary (Signed)
[facility] Discharge Summary   Patient ID: Brittany Foster MRN: [id number] DOB/AGE: 1985/03/30 30 y.o.  Admit date: 07/09/2016 Discharge date: 07/09/2016  Primary Care Physician:  Patient, [name]  Admission Diagnoses: Sickle Cell Crisis    Discharge Medications:  Allergies as of 07/09/2016      Reactions   Ultram [tramadol] Other (See Comments)   seizures   Zofran [ondansetron Hcl] Nausea And Vomiting   Buprenorphine Hcl Hives, Rash   Shaking Tolerates Percocet, Norco, and buprenorphine   Fentanyl Hives   Morphine And Related Hives, Rash, Other (See Comments)   Shaking Tolerates Percocet, Norco, Dilaudid, and buprenorphine   Tape Rash      Medication List    TAKE these medications   ARIPiprazole 10 MG tablet Commonly known as:  ABILIFY Take 10 mg by mouth daily   diphenhydrAMINE 25 MG tablet Commonly known as:  BENADRYL Take 50 mg by mouth every 6 (six) hours as needed for itching or allergies.   DULoxetine 60 MG capsule Commonly known as:  CYMBALTA TAKE 1 CAPSULE BY MOUTH DAILY. What changed:  See the new instructions.   folic acid 1 MG tablet Commonly known as:  FOLVITE Take 1 tablet (1 mg total) by mouth daily.   hydroxyurea 500 MG capsule Commonly known as:  HYDREA TAKE 2 CAPSULES BY MOUTH DAILY. MAY TAKE WITH FOOD TO MINIMIZE GI SIDE EFFECTS. What changed:  how much to take  how to take this  when to take this  additional instructions   ibuprofen 200 MG tablet Commonly known as:  ADVIL,MOTRIN Take 400 mg by mouth every 6 (six) hours as needed for moderate pain.   L-glutamine 5 g Pack Powder Packet Commonly known as:  ENDARI Take 10 g by mouth 2 (two) times daily.   medroxyPROGESTERone 150 MG/ML injection Commonly known as:  DEPO-PROVERA Inject 150 mg into the muscle every 3 (three) months.   morphine 30 MG 12 hr tablet Commonly known as:  MS CONTIN Take 1 tablet (30 mg total) by mouth every 12 (twelve) hours.    oxyCODONE-acetaminophen 10-325 MG tablet Commonly known as:  PERCOCET Take 1 tablet by mouth every 4 (four) hours as needed for pain.   Topiramate ER 100 MG Cp24 Take 100 mg by mouth at bedtime.       Consults:  N/A   Significant Diagnostic Studies:  Results for orders placed or performed during the hospital encounter of 07/09/16  Comprehensive metabolic panel  Result Value Ref Range   Sodium 139 135 - 145 mmol/L   Potassium 3.3 (L) 3.5 - 5.1 mmol/L   Chloride 111 101 - 111 mmol/L   CO2 20 (L) 22 - 32 mmol/L   Glucose, Bld 128 (H) 65 - 99 mg/dL   BUN 8 6 - 20 mg/dL   Creatinine, Ser 4.09 (L) 0.44 - 1.00 mg/dL   Calcium 9.2 8.9 - 81.1 mg/dL   Total Protein 8.4 (H) 6.5 - 8.1 g/dL   Albumin 4.7 3.5 - 5.0 g/dL   AST 67 (H) 15 - 41 U/L   ALT 42 14 - 54 U/L   Alkaline Phosphatase 102 38 - 126 U/L   Total Bilirubin 2.4 (H) 0.3 - 1.2 mg/dL   GFR calc non Af Amer >60 >60 mL/min   GFR calc Af Amer >60 >60 mL/min   Anion gap 8 5 - 15  CBC with Differential/Platelet  Result Value Ref Range   WBC 12.3 (H) 4.0 - 10.5 K/uL  RBC 2.79 (L) 3.87 - 5.11 MIL/uL   Hemoglobin 9.0 (L) 12.0 - 15.0 g/dL   HCT 04.526.1 (L) 40.936.0 - 81.146.0 %   MCV 93.5 78.0 - 100.0 fL   MCH 32.3 26.0 - 34.0 pg   MCHC 34.5 30.0 - 36.0 g/dL   RDW 91.421.9 (H) 78.211.5 - 95.615.5 %   Platelets 415 (H) 150 - 400 K/uL   Neutrophils Relative % 76 %   Lymphocytes Relative 16 %   Monocytes Relative 8 %   Eosinophils Relative 0 %   Basophils Relative 0 %   Neutro Abs 9.3 (H) 1.7 - 7.7 K/uL   Lymphs Abs 2.0 0.7 - 4.0 K/uL   Monocytes Absolute 1.0 0.1 - 1.0 K/uL   Eosinophils Absolute 0.0 0.0 - 0.7 K/uL   Basophils Absolute 0.0 0.0 - 0.1 K/uL   RBC Morphology SICKLE CELLS   Reticulocytes  Result Value Ref Range   Retic Ct Pct 14.3 (H) 0.4 - 3.1 %   RBC. 2.79 (L) 3.87 - 5.11 MIL/uL   Retic Count, Manual 399.0 (H) 19.0 - 186.0 K/uL    [facility] Course: Brittany CampbellMiranda Foster is a 31 y.o. female with a diagnosis of  Sickle Cell Anemia presents today with a complaint of bilateral leg pain x 1 day consistent with previous sickle cell pain crisis episodes. Pain on admission was 9/10.  She was admitted to the day infusion center for extended evaluation and the following was her coarse of treatment: Intravenous D5.45 @ 125 cc/hr administer for cellular rehydration. Placed on a High Concentration PCA per weight based protocol for pain control.  Patient used a total of 14.5 mg with 27 demands and 27 deliveries.  Patient states that pain intensity is tolerable 6-7/10, reduced from 9/10 on admission. Reviewed laboratory values, consistent with baseline. Patient is alert, oriented and ambulatory.    Physical Exam at Discharge:  BP 126/78 (BP Location: Left Arm)   Pulse (!) 105   Temp 99.2 F (37.3 C) (Oral)   Resp 19   Ht 5' (1.524 m)   Wt 130 lb (59 kg)   SpO2 99%   BMI 25.39 kg/m   General Appearance:  Alert, cooperative, no distress, appears stated age  Head:  Normocephalic, without obvious abnormality, atraumatic  Eyes:  PERRL, conjunctiva/corneas clear, EOM's intact, fundi  benign, both eyes  Back:  Symmetric, no curvature, ROM normal, no CVA tenderness  Lungs:  Clear to auscultation bilaterally, respirations unlabored  Chest Wall:  No tenderness or deformity  Heart:  Regular rate and rhythm, S1 and S2 normal, no murmur, rub or gallop  Abdomen:  Soft, non-tender, bowel sounds active all four quadrants,  no masses, no organomegaly  Extremities: Extremities normal, atraumatic, no cyanosis or edema  Pulses: 2+ and symmetric all extremities  Skin: Skin color, texture, turgor normal, no rashes or lesions  Neurologic: Normal strength       Disposition at Discharge: 01-Home or Self Care  Discharge Orders:  -Continue to hydrate and take prescribed home medications as ordered. -Resume all home medications. -Keep upcoming appointment  -The patient was given  clear instructions to go to ER or return to medical center if symptoms do not improve, worsen or new problems develop. The patient verbalized understanding.  Condition at Discharge:   Stable  Time spent on Discharge:  20 minutes    Signed: Joaquin CourtsKimberly Skyelyn Foster 07/09/2016, 5:08 PM

## 2016-07-09 NOTE — Telephone Encounter (Signed)
Pt [facility]. Pt states she has pain in her arms, legs, back and hips. She states her pain is 8/10. She denies fever, chest pain, vomiting, diarrhea and abd pain. She states she is nauseated. She has taken her Oxy at 7am and the ATC as prescribed. Will check with the provider and give her a call back. Pt voiced understanding.

## 2016-07-09 NOTE — Progress Notes (Signed)
  [facility] c/o bilateral arm, leg, back, and hip pain. Patient rates pain an 8/10 on pain scale. Patient was placed on a Dilaudid PCA and treated with IV fluids and IV Toradol. At time of discharge patient's pain was down to 7/10 on pain scale. Discharge instructions given to patient and patient states an understanding. Patient alert, oriented, and ambulatory at time of discharge. Patient ride downstairs to transport patient home.

## 2016-07-09 NOTE — Progress Notes (Signed)
Pt treated in day hospital and labs indicated low potassium 3.3. Brittany Foster advised me that she had 20 MEQ at home from prior treatment of low potassium. Advise to take 1 20 MEQ x 3 days. Verbalized understanding.

## 2016-07-09 NOTE — H&P (Signed)
Review of Systems  [facility] History and Physical   Date: 07/09/2016  Patient name: Brittany Foster Medical record number: [id number] Date of birth: 19-Mar-1985 Age: 31 y.o. Gender: female PCP: Patient, [name]  Attending physician: No att. providers found  Chief Complaint: Pain   History of Present Illness:  Brittany Foster is a 31 y.o. female with a diagnosis of Sickle Cell Anemia presents today with a complaint of bilateral leg pain x 1 day consistent with previous sickle cell pain crisis episodes. Current pain intensity 9/10. Characterizes pain as throbbing. Last hospital admission 06/28/2016 for sickle cell related pain crisis. She has a history of frequent ED visits. She last took home medication Percocet  at 7:00 am today without relief of symptoms. Patient denies headache, fever, shortness of breath, chest pain, dysuria, nausea, vomiting, or diarrhea  Meds: No prescriptions prior to admission.    Allergies: Ultram [tramadol]; Zofran [ondansetron hcl]; Buprenorphine hcl; Fentanyl; Morphine and related; and Tape Past Medical History:  Diagnosis Date  . Anemia   . Depression, major, recurrent (HCC)   . Migraines   . Sickle cell anemia (HCC)    Past Surgical History:  Procedure Laterality Date  . CESAREAN SECTION    . CHOLECYSTECTOMY  2000  . IR GENERIC HISTORICAL  10/08/2015   IR US GUIDE VASC ACCESS RIGHT 10/08/2015 Simonne Come, MD [facility]  . IR GENERIC HISTORICAL  10/08/2015   IR FLUORO GUIDE CV LINE RIGHT 10/08/2015 Simonne Come, MD [facility]  . MULTIPLE TOOTH EXTRACTIONS N/A   . port a cath placement Right    about 6-7 years ago  . removal of porta cath Right 09/11/15  . TUBAL LIGATION     Family History  Problem Relation Age of Onset  . Sickle cell trait Father   . Sickle cell trait Mother   . Sickle cell anemia Other    Social History   Social History  . Marital status: Single    Spouse name: N/A  . Number of children: N/A  . Years of  education: N/A   Occupational History  . None    Social History Main Topics  . Smoking status: Former Games developer  . Smokeless tobacco: Never Used  . Alcohol use No  . Drug use: No  . Sexual activity: Yes    Partners: Male    Birth control/ protection: Injection     Comment: 1 new partner recently, no concern about STI, uses condoms   Other Topics Concern  . Not on file   Social History Narrative   23 year old daughter    Review of Systems: A comprehensive review of systems was negative  Respiratory: Negative  Cardiovascular: Negative  Gastrointestinal: Negative  Musculoskeletal: positive for leg pain   Physical Exam: Blood pressure 126/78, pulse (!) 105, temperature 99.2 F (37.3 C), temperature source Oral, resp. rate 19, height 5' (1.524 m), weight 130 lb (59 kg), SpO2 99 %.  General Appearance:    Alert, cooperative, no distress, appears stated age  Head:    Normocephalic, without obvious abnormality, atraumatic  Eyes:    PERRL, conjunctiva/corneas clear, EOM's intact, fundi    benign, both eyes  Back:     Symmetric, no curvature, ROM normal, no CVA tenderness  Lungs:     Clear to auscultation bilaterally, respirations unlabored  Chest Wall:    No tenderness or deformity   Heart:    Regular rate and rhythm, S1 and S2 normal, no murmur, rub  or gallop  Abdomen:     Soft, non-tender, bowel sounds active all four quadrants,    no masses, no organomegaly  Extremities:   Extremities normal, atraumatic, no cyanosis or edema  Pulses:   2+ and symmetric all extremities  Skin:   Skin color, texture, turgor normal, no rashes or lesions  Neurologic:   Normal strength     Imaging results:  N/A  Assessment & Plan:  Patient will be admitted to the day infusion center for extended observation  Start IV D5.45 for cellular rehydration at 125/hr  IV Toradol 30 mg   Oxygen 2 Liters via Nasal Canula   Start Dilaudid PCA High Concentration per weight based protocol.    Patient will be re-evaluated for pain intensity in the context of function and relationship to baseline as care progresses.  If no significant pain relief, will transfer patient to inpatient services for a higher level of care.   Will check CMP, Reticulocyte, and CBC w/differential    Joaquin CourtsKimberly Cyrah Mclamb 07/09/2016, 4:54 PM                Physical Exam

## 2016-07-24 ENCOUNTER — Inpatient Hospital Stay (HOSPITAL_COMMUNITY)
Admission: EM | Admit: 2016-07-24 | Discharge: 2016-07-31 | DRG: 812 | Discharge status: Against Medical Advice | Payer: Medicaid Other | Attending: Internal Medicine | Admitting: Internal Medicine

## 2016-07-24 ENCOUNTER — Encounter (HOSPITAL_COMMUNITY): Payer: Self-pay

## 2016-07-24 DIAGNOSIS — G894 Chronic pain syndrome: Secondary | ICD-10-CM | POA: Diagnosis present

## 2016-07-24 DIAGNOSIS — D5700 Hb-SS disease with crisis, unspecified: Secondary | ICD-10-CM

## 2016-07-24 DIAGNOSIS — G43909 Migraine, unspecified, not intractable, without status migrainosus: Secondary | ICD-10-CM | POA: Diagnosis present

## 2016-07-24 DIAGNOSIS — Z888 Allergy status to other drugs, medicaments and biological substances status: Secondary | ICD-10-CM

## 2016-07-24 DIAGNOSIS — F339 Major depressive disorder, recurrent, unspecified: Secondary | ICD-10-CM | POA: Diagnosis present

## 2016-07-24 DIAGNOSIS — Z885 Allergy status to narcotic agent status: Secondary | ICD-10-CM

## 2016-07-24 DIAGNOSIS — E872 Acidosis: Secondary | ICD-10-CM | POA: Diagnosis present

## 2016-07-24 DIAGNOSIS — Z79891 Long term (current) use of opiate analgesic: Secondary | ICD-10-CM

## 2016-07-24 DIAGNOSIS — E559 Vitamin D deficiency, unspecified: Secondary | ICD-10-CM | POA: Diagnosis present

## 2016-07-24 DIAGNOSIS — E876 Hypokalemia: Secondary | ICD-10-CM | POA: Diagnosis present

## 2016-07-24 DIAGNOSIS — Z79899 Other long term (current) drug therapy: Secondary | ICD-10-CM

## 2016-07-24 LAB — COMPREHENSIVE METABOLIC PANEL
ALT: 26 U/L (ref 14–54)
ANION GAP: 6 (ref 5–15)
AST: 51 U/L — ABNORMAL HIGH (ref 15–41)
Albumin: 3.7 g/dL (ref 3.5–5.0)
Alkaline Phosphatase: 80 U/L (ref 38–126)
BILIRUBIN TOTAL: 1.7 mg/dL — AB (ref 0.3–1.2)
BUN: 8 mg/dL (ref 6–20)
CALCIUM: 8.8 mg/dL — AB (ref 8.9–10.3)
CO2: 21 mmol/L — AB (ref 22–32)
Chloride: 113 mmol/L — ABNORMAL HIGH (ref 101–111)
Creatinine, Ser: 0.5 mg/dL (ref 0.44–1.00)
GFR calc non Af Amer: >60 mL/min (ref >60)
Glucose, Bld: 124 mg/dL — ABNORMAL HIGH (ref 65–99)
Potassium: 3.3 mmol/L — ABNORMAL LOW (ref 3.5–5.1)
SODIUM: 140 mmol/L (ref 135–145)
TOTAL PROTEIN: 7 g/dL (ref 6.5–8.1)

## 2016-07-24 LAB — CBC WITH DIFFERENTIAL/PLATELET
BASOS ABS: 0 10*3/uL (ref 0.0–0.1)
Basophils Relative: 0 %
EOS ABS: 0.2 10*3/uL (ref 0.0–0.7)
Eosinophils Relative: 1 %
HCT: 23.8 % — ABNORMAL LOW (ref 36.0–46.0)
Hemoglobin: 8.2 g/dL — ABNORMAL LOW (ref 12.0–15.0)
LYMPHS PCT: 34 %
Lymphs Abs: 5.3 10*3/uL — ABNORMAL HIGH (ref 0.7–4.0)
MCH: 33.9 pg (ref 26.0–34.0)
MCHC: 34.5 g/dL (ref 30.0–36.0)
MCV: 98.3 fL (ref 78.0–100.0)
Monocytes Absolute: 1.1 10*3/uL — ABNORMAL HIGH (ref 0.1–1.0)
Monocytes Relative: 7 %
NEUTROS PCT: 58 %
Neutro Abs: 8.9 10*3/uL — ABNORMAL HIGH (ref 1.7–7.7)
PLATELETS: 343 10*3/uL (ref 150–400)
RBC: 2.42 MIL/uL — ABNORMAL LOW (ref 3.87–5.11)
RDW: 25.9 % — ABNORMAL HIGH (ref 11.5–15.5)
WBC: 15.5 10*3/uL — ABNORMAL HIGH (ref 4.0–10.5)
nRBC: 3 /100 WBC — ABNORMAL HIGH

## 2016-07-24 LAB — RETICULOCYTES: RBC.: 2.42 MIL/uL — ABNORMAL LOW (ref 3.87–5.11)

## 2016-07-24 MED ORDER — KETOROLAC TROMETHAMINE 30 MG/ML IJ SOLN
30.0000 mg | INTRAMUSCULAR | Status: AC
  Administered 2016-07-24: 30 mg via INTRAVENOUS
  Filled 2016-07-24: qty 1

## 2016-07-24 MED ORDER — HEPARIN SOD (PORK) LOCK FLUSH 100 UNIT/ML IV SOLN
INTRAVENOUS | Status: AC
  Administered 2016-07-24: 500 [IU]
  Filled 2016-07-24: qty 5

## 2016-07-24 MED ORDER — HYDROMORPHONE HCL 4 MG PO TABS
4.0000 mg | ORAL_TABLET | ORAL | Status: DC | PRN
  Administered 2016-07-25 – 2016-07-26 (×3): 4 mg via ORAL
  Filled 2016-07-24: qty 2
  Filled 2016-07-24 (×4): qty 1
  Filled 2016-07-24: qty 2

## 2016-07-24 MED ORDER — DIPHENHYDRAMINE HCL 50 MG/ML IJ SOLN
12.5000 mg | Freq: Once | INTRAMUSCULAR | Status: AC
  Administered 2016-07-24: 12.5 mg via INTRAVENOUS
  Filled 2016-07-24: qty 1

## 2016-07-24 MED ORDER — HYDROMORPHONE HCL 1 MG/ML IJ SOLN
0.5000 mg | Freq: Once | INTRAMUSCULAR | Status: AC
  Administered 2016-07-24: 0.5 mg via SUBCUTANEOUS
  Filled 2016-07-24: qty 0.5

## 2016-07-24 MED ORDER — METOCLOPRAMIDE HCL 5 MG/ML IJ SOLN
10.0000 mg | INTRAMUSCULAR | Status: AC
Start: 2016-07-24 — End: 2016-07-24
  Administered 2016-07-24: 10 mg via INTRAVENOUS
  Filled 2016-07-24: qty 2

## 2016-07-24 MED ORDER — DEXTROSE-NACL 5-0.45 % IV SOLN
INTRAVENOUS | Status: DC
  Administered 2016-07-24: 23:00:00 via INTRAVENOUS

## 2016-07-24 NOTE — ED Notes (Signed)
Patient request lab draw from her PICC.

## 2016-07-24 NOTE — ED Provider Notes (Signed)
[facility]-EMERGENCY DEPT Provider Note   CSN: [id number] Arrival date & time: 07/24/16  2059  By signing my name below, I, Teofilo PodMatthew P. Jamison, attest that this documentation has been prepared under the direction and in the presence of TRW AutomotiveKelly Marinell Igarashi, [place]PA-C. Electronically Signed: Teofilo PodMatthew P. Jamison, ED Scribe. 07/24/2016. 10:19 PM.    History   Chief Complaint Chief Complaint  Patient presents with  . Sickle Cell Pain Crisis    The history is provided by the patient. No language interpreter was used.   HPI Comments:  Brittany CampbellMiranda Square is a 31 y.o. female who presents to the Emergency Department complaining of a sickle cell pain crisis that began at 1500 today. Pt complains of worsening pain to her bilateral legs, hips and back. She describes the pain as "sharp and stabbing." She reports associated chills and nausea. Pt took oxycodone and ibuprofen today with no relief. Denies fever, chest pain, vomiting, diarrhea.   Past Medical History:  Diagnosis Date  . Anemia   . Depression, major, recurrent (HCC)   . Migraines   . Sickle cell anemia [facility](HCC)     Patient Active Problem List   Diagnosis Date Noted  . Sickle cell crisis (HCC) 05/24/2016  . Sickle cell anemia with pain (HCC) 02/14/2016  . Hypotension 01/26/2016  . Opiate dependence (HCC) 01/23/2016  . MDD (major depressive disorder), recurrent severe, without psychosis (HCC) 01/10/2016  . Vitamin D deficiency 01/08/2015  . Chronic pain syndrome 01/08/2015  . 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 Anemia 02/09/2014  . Neuropathy (HCC) 02/09/2014  . Sickle cell pain crisis (HCC) 12/30/2013    Past Surgical History:  Procedure Laterality Date  . CESAREAN SECTION    . CHOLECYSTECTOMY  2000  . IR GENERIC HISTORICAL  10/08/2015   IR US GUIDE VASC ACCESS RIGHT 10/08/2015 Simonne ComeJohn Watts, MD [facility]  . IR GENERIC HISTORICAL  10/08/2015   IR FLUORO GUIDE CV LINE RIGHT 10/08/2015 Simonne ComeJohn Watts, MD [facility]  . MULTIPLE TOOTH  EXTRACTIONS N/A   . port a cath placement Right    about 6-7 years ago  . removal of porta cath Right 09/11/15  . TUBAL LIGATION      OB History    Gravida Para Term Preterm AB Living   1         1   SAB TAB Ectopic Multiple Live Births           1       Home Medications    Prior to Admission medications   Medication Sig Start Date End Date Taking? Authorizing Provider  ARIPiprazole (ABILIFY) 10 MG tablet Take 10 mg by mouth daily 02/13/16  Yes [provider]  DULoxetine (CYMBALTA) 60 MG capsule TAKE 1 CAPSULE BY MOUTH DAILY. Patient taking differently: TAKE 60 MG BY MOUTH DAILY 11/27/15  Yes Quentin AngstJegede, Olugbemiga E, MD  folic acid (FOLVITE) 1 MG tablet Take 1 tablet (1 mg total) by mouth daily. 01/17/16  Yes Quentin AngstJegede, Olugbemiga E, MD  ibuprofen (ADVIL,MOTRIN) 200 MG tablet Take 400 mg by mouth every 6 (six) hours as needed for moderate pain.   Yes [provider]  oxyCODONE-acetaminophen (PERCOCET) 10-325 MG tablet Take 1 tablet by mouth every 6 (six) hours as needed for pain. for pain 07/20/16  Yes [provider]  hydroxyurea (HYDREA) 500 MG capsule TAKE 2 CAPSULES BY MOUTH DAILY. MAY TAKE WITH FOOD TO MINIMIZE GI SIDE EFFECTS. Patient taking differently: Take 500 mg by mouth daily.  05/12/16  Quentin Angst, MD  L-glutamine (ENDARI) 5 g PACK Powder Packet Take 10 g by mouth 2 (two) times daily. Patient not taking: Reported on 07/24/2016 05/30/16   Quentin Angst, MD  medroxyPROGESTERone (DEPO-PROVERA) 150 MG/ML injection Inject 150 mg into the muscle every 3 (three) months.     [provider]  morphine (MS CONTIN) 30 MG 12 hr tablet Take 1 tablet (30 mg total) by mouth every 12 (twelve) hours. Patient not taking: Reported on 07/24/2016 06/18/16   Quentin Angst, MD  potassium chloride 20 MEQ TBCR Take 20 mEq by mouth daily. Patient not taking: Reported on 07/24/2016 07/09/16   Bing Neighbors, FNP  Topiramate ER 100 MG CP24 Take  100 mg by mouth at bedtime.    [provider]    Family History Family History  Problem Relation Age of Onset  . Sickle cell trait Father   . Sickle cell trait Mother   . Sickle cell anemia Other     Social History Social History  Substance Use Topics  . Smoking status: Former Games developer  . Smokeless tobacco: Never Used  . Alcohol use No     Allergies   Ultram [tramadol]; Zofran [ondansetron hcl]; Buprenorphine hcl; Fentanyl; Morphine and related; and Tape   Review of Systems Review of Systems All systems reviewed and are negative for acute change except as noted in the HPI.   Physical Exam Updated Vital Signs BP 111/75   Pulse 98   Temp 99 F (37.2 C) (Oral)   Resp 16   Ht 5' (1.524 m)   Wt 59 kg (130 lb)   SpO2 96%   BMI 25.39 kg/m   Physical Exam  Constitutional: She is oriented to person, place, and time. She appears well-developed and well-nourished. No distress.  Nontoxic appearing and in NAD  HENT:  Head: Normocephalic and atraumatic.  Eyes: Conjunctivae and EOM are normal. No scleral icterus.  Neck: Normal range of motion.  Cardiovascular: Normal rate, regular rhythm and normal heart sounds.   No murmur heard. Pulmonary/Chest: Effort normal and breath sounds normal. No respiratory distress. She has no wheezes. She has no rales.  Lungs CTAB. Respirations even and unlabored.  Abdominal: Soft. She exhibits no distension. There is no tenderness. There is no guarding.  Soft, nondistended, nontender.  Musculoskeletal: Normal range of motion.  Neurological: She is alert and oriented to person, place, and time. She exhibits normal muscle tone. Coordination normal.  GCS 15. Patient moving all extremities.  Skin: Skin is warm and dry. No rash noted. She is not diaphoretic. No erythema. No pallor.  Psychiatric: She has a normal mood and affect. Her behavior is normal.  Nursing note and vitals reviewed.    ED Treatments / Results  DIAGNOSTIC  STUDIES:  Oxygen Saturation is 100% on RA, normal by my interpretation.    COORDINATION OF CARE:  10:15 PM Discussed treatment plan with pt at bedside and pt agreed to plan.   Labs (all labs ordered are listed, but only abnormal results are displayed) Labs Reviewed  COMPREHENSIVE METABOLIC PANEL - Abnormal; Notable for the following:       Result Value   Potassium 3.3 (*)    Chloride 113 (*)    CO2 21 (*)    Glucose, Bld 124 (*)    Calcium 8.8 (*)    AST 51 (*)    Total Bilirubin 1.7 (*)    All other components within normal limits  CBC WITH DIFFERENTIAL/PLATELET -  Abnormal; Notable for the following:    WBC 15.5 (*)    RBC 2.42 (*)    Hemoglobin 8.2 (*)    HCT 23.8 (*)    RDW 25.9 (*)    nRBC 3 (*)    Neutro Abs 8.9 (*)    Lymphs Abs 5.3 (*)    Monocytes Absolute 1.1 (*)    All other components within normal limits  RETICULOCYTES - Abnormal; Notable for the following:    Retic Ct Pct >23.0 (*)    RBC. 2.42 (*)    All other components within normal limits    EKG  EKG Interpretation None       Radiology No results found.  Procedures Procedures (including critical care time)  Medications Ordered in ED Medications  dextrose 5 %-0.45 % sodium chloride infusion ( Intravenous New Bag/Given 07/24/16 2239)  HYDROmorphone (DILAUDID) tablet 4 mg (4 mg Oral Given 07/25/16 0243)  morphine (MS CONTIN) 12 hr tablet 30 mg (30 mg Oral Refused 07/25/16 0030)  diphenhydrAMINE (BENADRYL) capsule 50 mg (50 mg Oral Given 07/25/16 0412)  HYDROmorphone (DILAUDID) injection 2 mg (not administered)  HYDROmorphone (DILAUDID) injection 0.5 mg (0.5 mg Subcutaneous Given 07/24/16 2129)  ketorolac (TORADOL) 30 MG/ML injection 30 mg (30 mg Intravenous Given 07/24/16 2228)  metoCLOPramide (REGLAN) injection 10 mg (10 mg Intravenous Given 07/24/16 2228)  diphenhydrAMINE (BENADRYL) injection 12.5 mg (12.5 mg Intravenous Given 07/24/16 2228)  heparin lock flush 100 UNIT/ML injection (500 Units   Given 07/24/16 2239)  potassium chloride SA (K-DUR,KLOR-CON) CR tablet 40 mEq (40 mEq Oral Given 07/25/16 0014)  promethazine (PHENERGAN) injection 12.5 mg (12.5 mg Intravenous Given 07/25/16 0128)  HYDROmorphone (DILAUDID) injection 1 mg (1 mg Intravenous Given 07/25/16 0129)  HYDROmorphone (DILAUDID) injection 1 mg (1 mg Intravenous Given 07/25/16 0232)  HYDROmorphone (DILAUDID) injection 2 mg (2 mg Intravenous Given 07/25/16 0328)     Initial Impression / Assessment and Plan / ED Course  I have reviewed the triage vital signs and the nursing notes.  Pertinent labs & imaging results that were available during my care of the patient were reviewed by me and considered in my medical decision making (see chart for details).     31 year old female presents to the emergency department for evaluation of pain consistent with past episodes of crisis. She denies fever and chest pain. Laboratory workup consistent with baseline. Doubt acute chest syndrome.  Patient given pain medication as advised in her care plan. This has not provided adequate pain control. Patient requesting admission for management of sickle cell crisis pain. Case discussed with Dr. Katrinka Blazing who will evaluate for admission.   Vitals:   07/25/16 0230 07/25/16 0300 07/25/16 0330 07/25/16 0400  BP: 103/64 104/67 109/77 111/75  Pulse: 99 95 100 98  Resp: 14 15 15 16   Temp:      TempSrc:      SpO2: 94% 93% 97% 96%  Weight:      Height:        Final Clinical Impressions(s) / ED Diagnoses   Final diagnoses:  Sickle cell anemia with pain (HCC)    New Prescriptions New Prescriptions   No medications on file   I personally performed the services described in this documentation, which was scribed in my presence. The recorded information has been reviewed and is accurate.       Antony Madura, PA-C 07/25/16 1610    Doug Sou, MD 07/26/16 [place] Bruns

## 2016-07-24 NOTE — ED Notes (Signed)
Writer attempted blood work, X 2 unsuccessful.  

## 2016-07-24 NOTE — ED Triage Notes (Signed)
Pt reports sickle cell pain crisis starting today around 6p. She reports pain in bilateral legs, hips, and her back. Denies chest pain or nausea. A&Ox4. Pt took oxycodone 10-325 and ibuprofen without relief before arrival.

## 2016-07-25 DIAGNOSIS — Z79899 Other long term (current) drug therapy: Secondary | ICD-10-CM | POA: Diagnosis not present

## 2016-07-25 DIAGNOSIS — G43909 Migraine, unspecified, not intractable, without status migrainosus: Secondary | ICD-10-CM | POA: Diagnosis present

## 2016-07-25 DIAGNOSIS — R701 Abnormal plasma viscosity: Secondary | ICD-10-CM | POA: Diagnosis not present

## 2016-07-25 DIAGNOSIS — D5700 Hb-SS disease with crisis, unspecified: Secondary | ICD-10-CM | POA: Diagnosis not present

## 2016-07-25 DIAGNOSIS — Z79891 Long term (current) use of opiate analgesic: Secondary | ICD-10-CM | POA: Diagnosis not present

## 2016-07-25 DIAGNOSIS — F1120 Opioid dependence, uncomplicated: Secondary | ICD-10-CM | POA: Diagnosis not present

## 2016-07-25 DIAGNOSIS — G894 Chronic pain syndrome: Secondary | ICD-10-CM | POA: Diagnosis not present

## 2016-07-25 DIAGNOSIS — Z885 Allergy status to narcotic agent status: Secondary | ICD-10-CM | POA: Diagnosis not present

## 2016-07-25 DIAGNOSIS — E559 Vitamin D deficiency, unspecified: Secondary | ICD-10-CM | POA: Diagnosis present

## 2016-07-25 DIAGNOSIS — F54 Psychological and behavioral factors associated with disorders or diseases classified elsewhere: Secondary | ICD-10-CM | POA: Diagnosis not present

## 2016-07-25 DIAGNOSIS — D638 Anemia in other chronic diseases classified elsewhere: Secondary | ICD-10-CM | POA: Diagnosis not present

## 2016-07-25 DIAGNOSIS — R269 Unspecified abnormalities of gait and mobility: Secondary | ICD-10-CM | POA: Diagnosis not present

## 2016-07-25 DIAGNOSIS — F339 Major depressive disorder, recurrent, unspecified: Secondary | ICD-10-CM | POA: Diagnosis present

## 2016-07-25 DIAGNOSIS — E872 Acidosis: Secondary | ICD-10-CM | POA: Diagnosis present

## 2016-07-25 DIAGNOSIS — Z888 Allergy status to other drugs, medicaments and biological substances status: Secondary | ICD-10-CM | POA: Diagnosis not present

## 2016-07-25 DIAGNOSIS — E876 Hypokalemia: Secondary | ICD-10-CM | POA: Diagnosis present

## 2016-07-25 LAB — BASIC METABOLIC PANEL
ANION GAP: 6 (ref 5–15)
BUN: 6 mg/dL (ref 6–20)
CALCIUM: 8.6 mg/dL — AB (ref 8.9–10.3)
CO2: 21 mmol/L — AB (ref 22–32)
Chloride: 112 mmol/L — ABNORMAL HIGH (ref 101–111)
Creatinine, Ser: 0.41 mg/dL — ABNORMAL LOW (ref 0.44–1.00)
GFR calc Af Amer: >60 mL/min (ref >60)
GFR calc non Af Amer: >60 mL/min (ref >60)
GLUCOSE: 100 mg/dL — AB (ref 65–99)
Potassium: 3.5 mmol/L (ref 3.5–5.1)
Sodium: 139 mmol/L (ref 135–145)

## 2016-07-25 LAB — CBC WITH DIFFERENTIAL/PLATELET
Basophils Absolute: 0.1 10*3/uL (ref 0.0–0.1)
Basophils Relative: 1 %
EOS PCT: 2 %
Eosinophils Absolute: 0.3 10*3/uL (ref 0.0–0.7)
HEMATOCRIT: 22.9 % — AB (ref 36.0–46.0)
Hemoglobin: 8.2 g/dL — ABNORMAL LOW (ref 12.0–15.0)
LYMPHS ABS: 5.8 10*3/uL — AB (ref 0.7–4.0)
Lymphocytes Relative: 42 %
MCH: 35.2 pg — ABNORMAL HIGH (ref 26.0–34.0)
MCHC: 35.8 g/dL (ref 30.0–36.0)
MCV: 98.3 fL (ref 78.0–100.0)
MONOS PCT: 9 %
Monocytes Absolute: 1.3 10*3/uL — ABNORMAL HIGH (ref 0.1–1.0)
Neutro Abs: 6.4 10*3/uL (ref 1.7–7.7)
Neutrophils Relative %: 46 %
Platelets: 310 10*3/uL (ref 150–400)
RBC: 2.33 MIL/uL — AB (ref 3.87–5.11)
RDW: 25.3 % — AB (ref 11.5–15.5)
WBC: 13.9 10*3/uL — AB (ref 4.0–10.5)

## 2016-07-25 MED ORDER — HYDROMORPHONE HCL 1 MG/ML IJ SOLN
2.0000 mg | Freq: Once | INTRAMUSCULAR | Status: AC
  Administered 2016-07-25: 2 mg via INTRAVENOUS
  Filled 2016-07-25: qty 2

## 2016-07-25 MED ORDER — FOLIC ACID 1 MG PO TABS
1.0000 mg | ORAL_TABLET | Freq: Every day | ORAL | Status: DC
  Administered 2016-07-25 – 2016-07-29 (×5): 1 mg via ORAL
  Filled 2016-07-25 (×6): qty 1

## 2016-07-25 MED ORDER — TOPIRAMATE 100 MG PO TABS
100.0000 mg | ORAL_TABLET | Freq: Every day | ORAL | Status: DC
  Administered 2016-07-25 – 2016-07-30 (×6): 100 mg via ORAL
  Filled 2016-07-25 (×6): qty 1

## 2016-07-25 MED ORDER — DIPHENHYDRAMINE HCL 25 MG PO CAPS
25.0000 mg | ORAL_CAPSULE | ORAL | Status: DC | PRN
  Administered 2016-07-27: 50 mg via ORAL
  Administered 2016-07-27: 25 mg via ORAL
  Administered 2016-07-30: 50 mg via ORAL
  Filled 2016-07-25 (×2): qty 2
  Filled 2016-07-25: qty 1

## 2016-07-25 MED ORDER — ENOXAPARIN SODIUM 40 MG/0.4ML ~~LOC~~ SOLN
40.0000 mg | SUBCUTANEOUS | Status: DC
  Filled 2016-07-25 (×5): qty 0.4

## 2016-07-25 MED ORDER — OXYCODONE-ACETAMINOPHEN 10-325 MG PO TABS: 1.0000 | ORAL_TABLET | ORAL | Status: DC | PRN

## 2016-07-25 MED ORDER — HYDROMORPHONE HCL 1 MG/ML IJ SOLN
1.0000 mg | Freq: Once | INTRAMUSCULAR | Status: AC
  Administered 2016-07-25: 1 mg via INTRAVENOUS
  Filled 2016-07-25: qty 1

## 2016-07-25 MED ORDER — POLYETHYLENE GLYCOL 3350 17 G PO PACK: 17.0000 g | PACK | Freq: Every day | ORAL | Status: DC | PRN

## 2016-07-25 MED ORDER — MORPHINE SULFATE ER 30 MG PO TBCR: 30.0000 mg | EXTENDED_RELEASE_TABLET | Freq: Two times a day (BID) | ORAL | Status: DC

## 2016-07-25 MED ORDER — HYDROXYUREA 500 MG PO CAPS
500.0000 mg | ORAL_CAPSULE | Freq: Every day | ORAL | Status: DC
  Administered 2016-07-25 – 2016-07-29 (×5): 500 mg via ORAL
  Filled 2016-07-25 (×6): qty 1

## 2016-07-25 MED ORDER — ARIPIPRAZOLE 10 MG PO TABS
10.0000 mg | ORAL_TABLET | Freq: Every day | ORAL | Status: DC
  Administered 2016-07-25 – 2016-07-31 (×7): 10 mg via ORAL
  Filled 2016-07-25 (×8): qty 1

## 2016-07-25 MED ORDER — PROMETHAZINE HCL 25 MG PO TABS
12.5000 mg | ORAL_TABLET | ORAL | Status: DC | PRN
  Administered 2016-07-25: 12.5 mg via ORAL
  Administered 2016-07-29: 25 mg via ORAL
  Filled 2016-07-25 (×2): qty 1

## 2016-07-25 MED ORDER — MEDROXYPROGESTERONE ACETATE 150 MG/ML IM SUSP
150.0000 mg | INTRAMUSCULAR | Status: DC
  Administered 2016-07-27: 150 mg via INTRAMUSCULAR
  Filled 2016-07-25: qty 1

## 2016-07-25 MED ORDER — DULOXETINE HCL 60 MG PO CPEP
60.0000 mg | ORAL_CAPSULE | Freq: Every day | ORAL | Status: DC
  Administered 2016-07-26 – 2016-07-31 (×6): 60 mg via ORAL
  Filled 2016-07-25 (×8): qty 1

## 2016-07-25 MED ORDER — POTASSIUM CHLORIDE CRYS ER 20 MEQ PO TBCR
40.0000 meq | EXTENDED_RELEASE_TABLET | Freq: Once | ORAL | Status: AC
  Administered 2016-07-25: 40 meq via ORAL
  Filled 2016-07-25: qty 2

## 2016-07-25 MED ORDER — L-GLUTAMINE ORAL POWDER
10.0000 g | PACK | Freq: Two times a day (BID) | ORAL | Status: DC
  Administered 2016-07-25 – 2016-07-31 (×8): 10 g via ORAL
  Filled 2016-07-25 (×14): qty 2

## 2016-07-25 MED ORDER — OXYCODONE HCL 5 MG PO TABS
5.0000 mg | ORAL_TABLET | ORAL | Status: DC | PRN
  Filled 2016-07-25 (×3): qty 1

## 2016-07-25 MED ORDER — MORPHINE SULFATE ER 30 MG PO TBCR: 30.0000 mg | EXTENDED_RELEASE_TABLET | Freq: Two times a day (BID) | ORAL | Status: AC

## 2016-07-25 MED ORDER — PROMETHAZINE HCL 25 MG RE SUPP: 12.5000 mg | RECTAL | Status: DC | PRN

## 2016-07-25 MED ORDER — KETOROLAC TROMETHAMINE 30 MG/ML IJ SOLN
30.0000 mg | Freq: Four times a day (QID) | INTRAMUSCULAR | Status: AC
  Administered 2016-07-25 – 2016-07-30 (×20): 30 mg via INTRAVENOUS
  Filled 2016-07-25 (×20): qty 1

## 2016-07-25 MED ORDER — SODIUM CHLORIDE 0.9 % IV SOLN
25.0000 mg | INTRAVENOUS | Status: DC | PRN
  Filled 2016-07-25: qty 0.5

## 2016-07-25 MED ORDER — OXYCODONE HCL 5 MG PO TABS
5.0000 mg | ORAL_TABLET | ORAL | Status: DC | PRN
  Administered 2016-07-26 – 2016-07-27 (×3): 5 mg via ORAL

## 2016-07-25 MED ORDER — OXYCODONE-ACETAMINOPHEN 5-325 MG PO TABS
1.0000 | ORAL_TABLET | ORAL | Status: DC | PRN
  Administered 2016-07-26 – 2016-07-27 (×4): 1 via ORAL
  Filled 2016-07-25: qty 1

## 2016-07-25 MED ORDER — HYDROMORPHONE HCL 1 MG/ML IJ SOLN
2.0000 mg | INTRAMUSCULAR | Status: DC | PRN
  Administered 2016-07-25 – 2016-07-26 (×5): 2 mg via INTRAVENOUS
  Filled 2016-07-25 (×5): qty 2

## 2016-07-25 MED ORDER — DEXTROSE-NACL 5-0.45 % IV SOLN
INTRAVENOUS | Status: DC
  Administered 2016-07-25 (×2): via INTRAVENOUS
  Administered 2016-07-26: 1000 mL via INTRAVENOUS
  Administered 2016-07-26: 21:00:00 via INTRAVENOUS
  Administered 2016-07-27: 1000 mL via INTRAVENOUS

## 2016-07-25 MED ORDER — DIPHENHYDRAMINE HCL 50 MG PO CAPS
50.0000 mg | ORAL_CAPSULE | Freq: Four times a day (QID) | ORAL | Status: DC | PRN
  Administered 2016-07-25: 50 mg via ORAL
  Filled 2016-07-25: qty 2

## 2016-07-25 MED ORDER — PROMETHAZINE HCL 25 MG/ML IJ SOLN
12.5000 mg | Freq: Once | INTRAMUSCULAR | Status: AC
  Administered 2016-07-25: 12.5 mg via INTRAVENOUS
  Filled 2016-07-25: qty 1

## 2016-07-25 MED ORDER — OXYCODONE-ACETAMINOPHEN 5-325 MG PO TABS
1.0000 | ORAL_TABLET | ORAL | Status: DC | PRN
  Administered 2016-07-25: 1 via ORAL
  Filled 2016-07-25 (×4): qty 1

## 2016-07-25 MED ORDER — SENNOSIDES-DOCUSATE SODIUM 8.6-50 MG PO TABS
1.0000 | ORAL_TABLET | Freq: Two times a day (BID) | ORAL | Status: DC
  Administered 2016-07-27 – 2016-07-29 (×2): 1 via ORAL
  Filled 2016-07-25 (×10): qty 1

## 2016-07-25 NOTE — Progress Notes (Signed)
Refused Toradol

## 2016-07-25 NOTE — ED Provider Notes (Signed)
Complains of bilateral leg pain and low back pain typical of sickle cell pain she's had in the past. This episode started 3 PM on 07/24/2016. She treated self with oxycodone and ibuprofen at home, without relief. Denies fever denies abdominal pain denies chest pain denies shortness of breath no other associated symptoms. On exam patient alert and nontoxic abdomen soft nontender back without point tenderness bilateral lower extremities without tenderness. DP pulses 2+ bilaterally   Doug SouJacubowitz, Sajad Glander, MD 07/25/16 [id number]

## 2016-07-25 NOTE — Progress Notes (Signed)
Ms Brittany Foster has been verbally rude and yelling at me stating that I have not been taking  care of her properly. I have spoken 3 times to Dr Mikeal HawthorneGarba for her and reported back to her about  our conversation . I have told her that he would come to see her when he came to see other patients on our floor.She also has a minor child in her room,She refuses to take her scheduled meds

## 2016-07-25 NOTE — Progress Notes (Signed)
Pt DC'D her IVF. Blood was backed up into the PICC line flushed with mild resistance then easily. Sets and cap changed. I advised her of the risk for taking her iv set down

## 2016-07-25 NOTE — H&P (Signed)
Brittany Foster is an 31 y.o. female.   Chief Complaint: Pain in the back and legs HPI: Patient is a 31 year old female with history of sickle cell disease but also chronic pain syndrome who has not been getting adequate outpatient care. Patient has been going from one hospital to another for pain management. She was at [facility] only 3 days ago and left AGAINST MEDICAL ADVICE. She is here again today with pain at 9 out of 10. Review of her labs show that they're better than when she left AMA from for size. She is describing pain as typical sickle cell pain. Denied any fever or chills no nausea vomiting or diarrhea.  Past Medical History:  Diagnosis Date  . Anemia   . Depression, major, recurrent ([place])   . Migraines   . Sickle cell anemia (HCC)     Past Surgical History:  Procedure Laterality Date  . CESAREAN SECTION    . CHOLECYSTECTOMY  2000  . IR GENERIC HISTORICAL  10/08/2015   IR US GUIDE VASC ACCESS RIGHT 10/08/2015 Sandi Mariscal, MD [facility]  . IR GENERIC HISTORICAL  10/08/2015   IR FLUORO GUIDE CV LINE RIGHT 10/08/2015 Sandi Mariscal, MD [facility]  . MULTIPLE TOOTH EXTRACTIONS N/A   . port a cath placement Right    about 6-7 years ago  . removal of porta cath Right 09/11/15  . TUBAL LIGATION      Family History  Problem Relation Age of Onset  . Sickle cell trait Father   . Sickle cell trait Mother   . Sickle cell anemia Other    Social History:  reports that she has quit smoking. She has never used smokeless tobacco. She reports that she does not drink alcohol or use drugs.  Allergies:  Allergies  Allergen Reactions  . Ultram [Tramadol] Other (See Comments)    seizures  . Zofran [Ondansetron Hcl] Nausea And Vomiting  . Buprenorphine Hcl Hives and Rash    Shaking Tolerates Percocet, Norco, and buprenorphine  . Fentanyl Hives  . Morphine And Related Hives, Rash and Other (See Comments)    Shaking Tolerates Percocet, Norco, Dilaudid, and buprenorphine  . Tape  Rash    Medications Prior to Admission  Medication Sig Dispense Refill  . ARIPiprazole (ABILIFY) 10 MG tablet Take 10 mg by mouth daily  3  . DULoxetine (CYMBALTA) 60 MG capsule TAKE 1 CAPSULE BY MOUTH DAILY. (Patient taking differently: TAKE 60 MG BY MOUTH DAILY) 30 capsule 3  . folic acid (FOLVITE) 1 MG tablet Take 1 tablet (1 mg total) by mouth daily. 90 tablet 3  . ibuprofen (ADVIL,MOTRIN) 200 MG tablet Take 400 mg by mouth every 6 (six) hours as needed for moderate pain.    Marland Kitchen oxyCODONE-acetaminophen (PERCOCET) 10-325 MG tablet Take 1 tablet by mouth every 6 (six) hours as needed for pain. for pain  0  . hydroxyurea (HYDREA) 500 MG capsule TAKE 2 CAPSULES BY MOUTH DAILY. MAY TAKE WITH FOOD TO MINIMIZE GI SIDE EFFECTS. (Patient taking differently: Take 500 mg by mouth daily. ) 180 capsule 3  . L-glutamine (ENDARI) 5 g PACK Powder Packet Take 10 g by mouth 2 (two) times daily. (Patient not taking: Reported on 07/24/2016) 60 packet 3  . medroxyPROGESTERone (DEPO-PROVERA) 150 MG/ML injection Inject 150 mg into the muscle every 3 (three) months.     . morphine (MS CONTIN) 30 MG 12 hr tablet Take 1 tablet (30 mg total) by mouth every 12 (twelve) hours. (Patient not taking:  Reported on 07/24/2016) 60 tablet 0  . potassium chloride 20 MEQ TBCR Take 20 mEq by mouth daily. (Patient not taking: Reported on 07/24/2016) 3 tablet 0  . Topiramate ER 100 MG CP24 Take 100 mg by mouth at bedtime.      Results for orders placed or performed during the hospital encounter of 07/24/16 (from the past 48 hour(s))  Comprehensive metabolic panel     Status: Abnormal   Collection Time: 07/24/16  9:58 PM  Result Value Ref Range   Sodium 140 135 - 145 mmol/L   Potassium 3.3 (L) 3.5 - 5.1 mmol/L   Chloride 113 (H) 101 - 111 mmol/L   CO2 21 (L) 22 - 32 mmol/L   Glucose, Bld 124 (H) 65 - 99 mg/dL   BUN 8 6 - 20 mg/dL   Creatinine, Ser 0.50 0.44 - 1.00 mg/dL   Calcium 8.8 (L) 8.9 - 10.3 mg/dL   Total Protein 7.0 6.5 -  8.1 g/dL   Albumin 3.7 3.5 - 5.0 g/dL   AST 51 (H) 15 - 41 U/L   ALT 26 14 - 54 U/L   Alkaline Phosphatase 80 38 - 126 U/L   Total Bilirubin 1.7 (H) 0.3 - 1.2 mg/dL   GFR calc non Af Amer >60 >60 mL/min   GFR calc Af Amer >60 >60 mL/min    Comment: (NOTE) The eGFR has been calculated using the CKD EPI equation. This calculation has not been validated in all clinical situations. eGFR's persistently <60 mL/min signify possible Chronic Kidney Disease.    Anion gap 6 5 - 15  CBC with Differential     Status: Abnormal   Collection Time: 07/24/16  9:58 PM  Result Value Ref Range   WBC 15.5 (H) 4.0 - 10.5 K/uL   RBC 2.42 (L) 3.87 - 5.11 MIL/uL   Hemoglobin 8.2 (L) 12.0 - 15.0 g/dL   HCT 23.8 (L) 36.0 - 46.0 %   MCV 98.3 78.0 - 100.0 fL   MCH 33.9 26.0 - 34.0 pg   MCHC 34.5 30.0 - 36.0 g/dL   RDW 25.9 (H) 11.5 - 15.5 %   Platelets 343 150 - 400 K/uL   Neutrophils Relative % 58 %   Lymphocytes Relative 34 %   Monocytes Relative 7 %   Eosinophils Relative 1 %   Basophils Relative 0 %   nRBC 3 (H) 0 /100 WBC   Neutro Abs 8.9 (H) 1.7 - 7.7 K/uL   Lymphs Abs 5.3 (H) 0.7 - 4.0 K/uL   Monocytes Absolute 1.1 (H) 0.1 - 1.0 K/uL   Eosinophils Absolute 0.2 0.0 - 0.7 K/uL   Basophils Absolute 0.0 0.0 - 0.1 K/uL   RBC Morphology SICKLE CELLS     Comment: POLYCHROMASIA PRESENT TARGET CELLS HOWELL/JOLLY BODIES RARE NRBCs   Reticulocytes     Status: Abnormal   Collection Time: 07/24/16  9:58 PM  Result Value Ref Range   Retic Ct Pct >23.0 (H) 0.4 - 3.1 %    Comment: RESULTS CONFIRMED BY MANUAL DILUTION   RBC. 2.42 (L) 3.87 - 5.11 MIL/uL   Retic Count, Manual NOT CALCULATED 19.0 - 186.0 K/uL  CBC with Differential/Platelet     Status: Abnormal   Collection Time: 07/25/16  6:37 AM  Result Value Ref Range   WBC 13.9 (H) 4.0 - 10.5 K/uL   RBC 2.33 (L) 3.87 - 5.11 MIL/uL   Hemoglobin 8.2 (L) 12.0 - 15.0 g/dL   HCT 22.9 (L) 36.0 - 46.0 %  MCV 98.3 78.0 - 100.0 fL   MCH 35.2 (H) 26.0 -  34.0 pg   MCHC 35.8 30.0 - 36.0 g/dL   RDW 25.3 (H) 11.5 - 15.5 %   Platelets 310 150 - 400 K/uL   Neutrophils Relative % 46 %   Lymphocytes Relative 42 %   Monocytes Relative 9 %   Eosinophils Relative 2 %   Basophils Relative 1 %   Neutro Abs 6.4 1.7 - 7.7 K/uL   Lymphs Abs 5.8 (H) 0.7 - 4.0 K/uL   Monocytes Absolute 1.3 (H) 0.1 - 1.0 K/uL   Eosinophils Absolute 0.3 0.0 - 0.7 K/uL   Basophils Absolute 0.1 0.0 - 0.1 K/uL   RBC Morphology RARE NRBCs     Comment: SICKLE CELLS TARGET CELLS POLYCHROMASIA PRESENT   Basic metabolic panel     Status: Abnormal   Collection Time: 07/25/16  6:37 AM  Result Value Ref Range   Sodium 139 135 - 145 mmol/L   Potassium 3.5 3.5 - 5.1 mmol/L   Chloride 112 (H) 101 - 111 mmol/L   CO2 21 (L) 22 - 32 mmol/L   Glucose, Bld 100 (H) 65 - 99 mg/dL   BUN 6 6 - 20 mg/dL   Creatinine, Ser 0.41 (L) 0.44 - 1.00 mg/dL   Calcium 8.6 (L) 8.9 - 10.3 mg/dL   GFR calc non Af Amer >60 >60 mL/min   GFR calc Af Amer >60 >60 mL/min    Comment: (NOTE) The eGFR has been calculated using the CKD EPI equation. This calculation has not been validated in all clinical situations. eGFR's persistently <60 mL/min signify possible Chronic Kidney Disease.    Anion gap 6 5 - 15   No results found.  Review of Systems  Constitutional: Negative.   HENT: Negative.   Eyes: Negative.   Respiratory: Negative.   Cardiovascular: Negative.   Gastrointestinal: Negative.   Genitourinary: Negative.   Musculoskeletal: Positive for joint pain and neck pain.  Skin: Negative.   Neurological: Negative.   Endo/Heme/Allergies: Negative.   Psychiatric/Behavioral: Negative.     Blood pressure 104/72, pulse 95, temperature 98.4 F (36.9 C), temperature source Oral, resp. rate 16, height 5' (1.524 m), weight 60.2 kg (132 lb 11.5 oz), SpO2 99 %. Physical Exam  Constitutional: She is oriented to person, place, and time. She appears well-developed and well-nourished.  HENT:  Head:  Normocephalic and atraumatic.  Eyes: Conjunctivae are normal. Pupils are equal, round, and reactive to light.  Neck: Normal range of motion. Neck supple.  Cardiovascular: Normal rate, regular rhythm and normal heart sounds.   Respiratory: Breath sounds normal.  GI: Soft. Bowel sounds are normal.  Musculoskeletal: Normal range of motion.  Neurological: She is oriented to person, place, and time.  Skin: Skin is warm.  Psychiatric: Her affect is angry and inappropriate.     Assessment/Plan A 31 year old female being admitted with sickle cell painful crisis. #1 sickle cell painful crisis: Patient has chronic pain in her nausea she has 2 sickle cell crisis. I will minimize the use of IV Dilaudid as this patient. I will start her on her oral home medications although she refused to take the MS Contin. I will give her IV Dilaudid tomograms Q4 hours when necessary. If out when her pain approaches baseline I will consider discharge. In the meantime I've counseled patient about outpatient care which seems to be the major problem. She insists she is taking her oral pain medications. I will add Toradol and IV  fluids to her regimen.  #2 sickle cell anemia: She has chronic anemia. H&H appears to be stable at this point. Continue close monitoring  #3 opiate dependence: Patient has been referred to outpatient pain management in [place]. She has not been compliant. Counseling now given once again.  #4 major depressive disorder: Patient will be maintain on home regimen for her depression.   Barbette Merino, MD 07/25/2016, 9:24 AM

## 2016-07-25 NOTE — Progress Notes (Signed)
Received signout from TRW AutomotiveKelly Humes, PA-C SSc dz with pain crisis. Patient reports back and bilateral leg pain. That was not relieved with multiple rounds of IV narcotics. Patient with chronically elevated WBC. Admit observation to a MedSurg bed.

## 2016-07-25 NOTE — Progress Notes (Signed)
Pt asked for pain med for a pain score or 8-9 .I took her the po dilaudid 4  Mg. She refused and demanded to see a MD "RIGHT NOW ". I Spoke to Dr Mikeal HawthorneGarba.  He stated that he will not give her anything because she just  got out of another hosptial   .He stated that he will comeand talk to her.I told her he would come see her ,Then I asked if I could do her assessment. She ignored me.I asked her again and she stated no." I might be leaving anyway

## 2016-07-26 MED ORDER — HYDROMORPHONE 1 MG/ML IV SOLN
INTRAVENOUS | Status: DC
  Administered 2016-07-26: 3 mg via INTRAVENOUS
  Administered 2016-07-26: 25 mg via INTRAVENOUS
  Administered 2016-07-26: 4 mg via INTRAVENOUS
  Administered 2016-07-26: 25 mg via INTRAVENOUS
  Administered 2016-07-26: 7 mg via INTRAVENOUS
  Administered 2016-07-26: 5.6 mg via INTRAVENOUS
  Administered 2016-07-27: 9 mg via INTRAVENOUS
  Administered 2016-07-27 (×2): 4.5 mg via INTRAVENOUS
  Administered 2016-07-27: 3.5 mg via INTRAVENOUS
  Administered 2016-07-27: 5.5 mg via INTRAVENOUS
  Administered 2016-07-27: 9 mg via INTRAVENOUS
  Administered 2016-07-27: 25 mg via INTRAVENOUS
  Administered 2016-07-28: 7 mg via INTRAVENOUS
  Administered 2016-07-28: 25 mg via INTRAVENOUS
  Administered 2016-07-28: 6.96 mg via INTRAVENOUS
  Administered 2016-07-28: 4 mg via INTRAVENOUS
  Filled 2016-07-26 (×4): qty 25

## 2016-07-26 MED ORDER — HYDROMORPHONE HCL 4 MG/ML IJ SOLN
2.0000 mg | INTRAMUSCULAR | Status: DC | PRN
  Administered 2016-07-26: 2 mg via INTRAVENOUS
  Filled 2016-07-26: qty 1

## 2016-07-26 MED ORDER — SODIUM CHLORIDE 0.9% FLUSH: 9.0000 mL | INTRAVENOUS | Status: DC | PRN

## 2016-07-26 MED ORDER — ONDANSETRON HCL 4 MG/2ML IJ SOLN: 4.0000 mg | Freq: Four times a day (QID) | INTRAMUSCULAR | Status: DC | PRN

## 2016-07-26 MED ORDER — NALOXONE HCL 0.4 MG/ML IJ SOLN: 0.4000 mg | INTRAMUSCULAR | Status: DC | PRN

## 2016-07-26 MED ORDER — MORPHINE SULFATE ER 30 MG PO TBCR
30.0000 mg | EXTENDED_RELEASE_TABLET | Freq: Two times a day (BID) | ORAL | Status: DC
  Administered 2016-07-26 – 2016-07-31 (×11): 30 mg via ORAL
  Filled 2016-07-26 (×11): qty 1

## 2016-07-26 NOTE — Progress Notes (Signed)
Subjective: A 31 year old female admitted with sickle cell painful crisis. Patient still complaining of pain at 9/10. No NVD. Has chronic pain. No fever or chills.  Objective: Vital signs in last 24 hours: Temp:  [98.1 F (36.7 C)-98.9 F (37.2 C)] 98.1 F (36.7 C) (05/27 0542) Pulse Rate:  [86-110] 86 (05/27 0542) Resp:  [16-18] 16 (05/27 0542) BP: (98-110)/(67-88) 102/68 (05/27 0542) SpO2:  [96 %-100 %] 96 % (05/27 0542) Weight:  [60.6 kg (133 lb 9.6 oz)] 60.6 kg (133 lb 9.6 oz) (05/27 0542) Weight change: 1.632 kg (3 lb 9.6 oz) Last BM Date: 07/25/16  Intake/Output from previous day: 05/26 0701 - 05/27 0700 In: 2458.3 [P.O.:840; I.V.:1618.3] Out: 2100 [Urine:2100] Intake/Output this shift: No intake/output data recorded.  General appearance: alert, cooperative, appears stated age and no distress Back: symmetric, no curvature. ROM normal. No CVA tenderness. Resp: clear to auscultation bilaterally Chest wall: no tenderness Cardio: regular rate and rhythm, S1, S2 normal, no murmur, click, rub or gallop GI: soft, non-tender; bowel sounds normal; no masses,  no organomegaly Extremities: extremities normal, atraumatic, no cyanosis or edema Pulses: 2+ and symmetric Skin: Skin color, texture, turgor normal. No rashes or lesions Neurologic: Grossly normal  Lab Results:  Recent Labs  07/24/16 2158 07/25/16 0637  WBC 15.5* 13.9*  HGB 8.2* 8.2*  HCT 23.8* 22.9*  PLT 343 310   BMET  Recent Labs  07/24/16 2158 07/25/16 0637  NA 140 139  K 3.3* 3.5  CL 113* 112*  CO2 21* 21*  GLUCOSE 124* 100*  BUN 8 6  CREATININE 0.50 0.41*  CALCIUM 8.8* 8.6*    Studies/Results: No results found.  Medications: I have reviewed the patient's current medications.  Assessment/Plan: A 31 year old female being admitted with sickle cell painful crisis. #1 sickle cell painful crisis: I will initiate Dilaudid PCA in addition to the Toradol and IVF. Will continue oral medications.  Re-start her MS Contin.  #2 sickle cell anemia: She has chronic anemia. H&H appears to be stable at this point. Continue close monitoring  #3 opiate dependence: Patient has been referred to outpatient pain management in [place]. She has not been compliant. Counseling now given once again. Restarted MS contin.  #4 major depressive disorder: Patient will be maintain on home regimen for her depression.  LOS: 1 day   GARBA,LAWAL 07/26/2016, 7:35 AM

## 2016-07-26 NOTE — Progress Notes (Signed)
Incentive spirometer-pt achieved 1250

## 2016-07-27 DIAGNOSIS — D638 Anemia in other chronic diseases classified elsewhere: Secondary | ICD-10-CM

## 2016-07-27 DIAGNOSIS — G894 Chronic pain syndrome: Secondary | ICD-10-CM

## 2016-07-27 DIAGNOSIS — E876 Hypokalemia: Secondary | ICD-10-CM

## 2016-07-27 DIAGNOSIS — R269 Unspecified abnormalities of gait and mobility: Secondary | ICD-10-CM

## 2016-07-27 LAB — BASIC METABOLIC PANEL
Anion gap: 4 — ABNORMAL LOW (ref 5–15)
BUN: 8 mg/dL (ref 6–20)
CHLORIDE: 111 mmol/L (ref 101–111)
CO2: 24 mmol/L (ref 22–32)
CREATININE: 0.38 mg/dL — AB (ref 0.44–1.00)
Calcium: 8.3 mg/dL — ABNORMAL LOW (ref 8.9–10.3)
GFR calc Af Amer: >60 mL/min (ref >60)
Glucose, Bld: 151 mg/dL — ABNORMAL HIGH (ref 65–99)
POTASSIUM: 3.4 mmol/L — AB (ref 3.5–5.1)
SODIUM: 139 mmol/L (ref 135–145)

## 2016-07-27 LAB — CBC WITH DIFFERENTIAL/PLATELET
BASOS ABS: 0.1 10*3/uL (ref 0.0–0.1)
Basophils Relative: 1 %
EOS ABS: 1 10*3/uL — AB (ref 0.0–0.7)
Eosinophils Relative: 10 %
HCT: 21.7 % — ABNORMAL LOW (ref 36.0–46.0)
Hemoglobin: 7.5 g/dL — ABNORMAL LOW (ref 12.0–15.0)
LYMPHS ABS: 3.8 10*3/uL (ref 0.7–4.0)
Lymphocytes Relative: 37 %
MCH: 34.1 pg — ABNORMAL HIGH (ref 26.0–34.0)
MCHC: 34.6 g/dL (ref 30.0–36.0)
MCV: 98.6 fL (ref 78.0–100.0)
MONO ABS: 0.7 10*3/uL (ref 0.1–1.0)
Monocytes Relative: 7 %
NEUTROS PCT: 45 %
Neutro Abs: 4.7 10*3/uL (ref 1.7–7.7)
PLATELETS: 249 10*3/uL (ref 150–400)
RBC: 2.2 MIL/uL — AB (ref 3.87–5.11)
RDW: 22 % — AB (ref 11.5–15.5)
WBC: 10.3 10*3/uL (ref 4.0–10.5)

## 2016-07-27 LAB — RETICULOCYTES
RBC.: 2.2 MIL/uL — ABNORMAL LOW (ref 3.87–5.11)
Retic Count, Absolute: 426.8 10*3/uL — ABNORMAL HIGH (ref 19.0–186.0)
Retic Ct Pct: 19.4 % — ABNORMAL HIGH (ref 0.4–3.1)

## 2016-07-27 LAB — MAGNESIUM: MAGNESIUM: 1.9 mg/dL (ref 1.7–2.4)

## 2016-07-27 MED ORDER — POTASSIUM CHLORIDE CRYS ER 20 MEQ PO TBCR
40.0000 meq | EXTENDED_RELEASE_TABLET | Freq: Every day | ORAL | Status: DC
  Administered 2016-07-27 – 2016-07-31 (×5): 40 meq via ORAL
  Filled 2016-07-27 (×5): qty 2

## 2016-07-27 MED ORDER — MAGNESIUM OXIDE 400 (241.3 MG) MG PO TABS
400.0000 mg | ORAL_TABLET | Freq: Two times a day (BID) | ORAL | Status: DC
  Administered 2016-07-27 – 2016-07-31 (×8): 400 mg via ORAL
  Filled 2016-07-27 (×8): qty 1

## 2016-07-27 NOTE — Progress Notes (Signed)
Pt turns and repositions herself. Ambulates only to the bathroom. Sleeping for long intervals. Continues to rate her pain a 7.

## 2016-07-27 NOTE — Progress Notes (Signed)
SICKLE CELL SERVICE PROGRESS NOTE  Glee Lashomb ZOX:[id number] DOB: December 04, 1985 DOA: 07/24/2016 PCP: Patient, [name]  Assessment/Plan: Principal Problem:   Sickle cell pain crisis (HCC)  1. Hb SS with Crisis: Pt on PCA and will continue at current dose. Also Continue Toradol and decrease IVF to [facility] as patient tolerating diet well.  2. Anemia of Chronic Disease: Hb at baseline on 5/26. Labs pending from today. Pt reports adherence to Hydrea however dose of 500 mg sub-therapeutic.  Continue Hydrea at reported dose of 500 mg daily. Pt has an appointment with Dr. Barrie Dunker on 6/14 at [facility]. Will defer any dose changes to Dr. Terrance Mass.  3. Metabolic Acidosis: HCO3 mildly decrease, Expect resolution after hydration. Labs pending from today.  4. Hypokalemia: Labs pending from today. If still hypokalemic will check Mg levels.  5. Chronic Pain Syndrome. Pt has been taking Oxycodone and states that she is out of MS Contin. She has no proscriber at this time.  6. H/O Depression: On Cymbalta and Abilify. Continue.  7. H/O Migraine HA: Continue Topomax 8. Concerns for malingering:  Pt has a long h/o drug seeking behaviors and typically leaves the hospital AMA when she cannot receive IV Benadryl and or IV Phenergan. I have informed patient that she will be receiving both Benadryl and Phenergan by mouth. She verbalized understanding and did not voice any opposition.   Code Status: Full Code Family Communication: N/A Disposition Plan: Not yet ready for discharge  [name],[name]  Pager [phone]. If 7PM-7AM, please contact night-coverage.  07/27/2016, 12:02 PM  LOS: 2 days   Interim History: Pt reports pain in hips, legs and back at an intensity of 7/10. She has used 32.5 mg of Dilaudid and 67/65: demands/deliveries in the last 24 hours.   Consultants:  None  Procedures:  None  Antibiotics:  None   Objective: Vitals:   07/27/16 0420 07/27/16 0443 07/27/16 0945 07/27/16 1048  BP:  (!)  93/50  95/65  Pulse:  78  85  Resp: 15 15 16 20   Temp:  98.8 F (37.1 C)  98.1 F (36.7 C)  TempSrc:  Oral  Oral  SpO2: 95% 95%  94%  Weight:  61 kg (134 lb 7.7 oz)    Height:       Weight change: 0.4 kg (14.1 oz)  Intake/Output Summary (Last 24 hours) at 07/27/16 1202 Last data filed at 07/27/16 1049  Gross per 24 hour  Intake             1135 ml  Output             2100 ml  Net             -965 ml     Physical Exam General: Alert, awake, oriented x3, in no acute distress. Well appearing. HEENT: [place]/AT PEERL, EOMI, anicteric Neck: Trachea midline,  no masses, no thyromegal,y no JVD, no carotid bruit OROPHARYNX:  Moist, No exudate/ erythema/lesions.  Heart: Regular rate and rhythm, without murmurs, rubs, gallops, PMI non-displaced, no heaves or thrills on palpation.  Lungs: Clear to auscultation, no wheezing or rhonchi noted. No increased vocal fremitus resonant to percussion  Abdomen: Soft, nontender, nondistended, positive bowel sounds, no masses no hepatosplenomegaly noted.  Neuro: No focal neurological deficits noted cranial nerves II through XII grossly intact. Strength at functional baselinein bilateral upper and lower extremities. Musculoskeletal: No warmth swelling or erythema around joints, no spinal tenderness noted. Gait observed to be mildly antalgic. Psychiatric: Patient  alert and oriented x3, good insight and cognition, good recent to remote recall.     Data Reviewed: Basic Metabolic Panel:  Recent Labs Lab 07/24/16 2158 07/25/16 0637  NA 140 139  K 3.3* 3.5  CL 113* 112*  CO2 21* 21*  GLUCOSE 124* 100*  BUN 8 6  CREATININE 0.50 0.41*  CALCIUM 8.8* 8.6*   Liver Function Tests:  Recent Labs Lab 07/24/16 2158  AST 51*  ALT 26  ALKPHOS 80  BILITOT 1.7*  PROT 7.0  ALBUMIN 3.7   No results for input(s): LIPASE, AMYLASE in the last 168 hours. No results for input(s): AMMONIA in the last 168 hours. CBC:  Recent Labs Lab 07/24/16 2158  07/25/16 0637  WBC 15.5* 13.9*  NEUTROABS 8.9* 6.4  HGB 8.2* 8.2*  HCT 23.8* 22.9*  MCV 98.3 98.3  PLT 343 310   Cardiac Enzymes: No results for input(s): CKTOTAL, CKMB, CKMBINDEX, TROPONINI in the last 168 hours. BNP (last 3 results) No results for input(s): BNP in the last 8760 hours.  ProBNP (last 3 results) No results for input(s): PROBNP in the last 8760 hours.  CBG: No results for input(s): GLUCAP in the last 168 hours.  No results found for this or any previous visit (from the past 240 hour(s)).   Studies: No results found.  Scheduled Meds: . ARIPiprazole  10 mg Oral Daily  . DULoxetine  60 mg Oral Daily  . enoxaparin (LOVENOX) injection  40 mg Subcutaneous Q24H  . folic acid  1 mg Oral Daily  . HYDROmorphone   Intravenous Q4H  . hydroxyurea  500 mg Oral Daily  . ketorolac  30 mg Intravenous Q6H  . L-glutamine  10 g Oral BID  . medroxyPROGESTERone  150 mg Intramuscular Q90 days  . morphine  30 mg Oral Q12H  . senna-docusate  1 tablet Oral BID  . topiramate  100 mg Oral QHS   Continuous Infusions: . dextrose 5 % and 0.45% NaCl 10 mL (07/27/16 0957)    Principal Problem:   Sickle cell pain crisis (HCC)    In excess of 25 minutes spent during this visit. Greater than 50% involved face to face contact with the patient for assessment, counseling and coordination of care.

## 2016-07-28 MED ORDER — HYDROMORPHONE 1 MG/ML IV SOLN
INTRAVENOUS | Status: DC
  Administered 2016-07-28: 10.5 mg via INTRAVENOUS
  Administered 2016-07-28: 6 mg via INTRAVENOUS
  Administered 2016-07-28: 25 mg via INTRAVENOUS
  Administered 2016-07-28: 5.95 mg via INTRAVENOUS
  Administered 2016-07-29: 6.6 mg via INTRAVENOUS
  Administered 2016-07-29: 25 mg via INTRAVENOUS
  Administered 2016-07-29: 7.2 mg via INTRAVENOUS
  Administered 2016-07-29: 10.2 mg via INTRAVENOUS
  Administered 2016-07-30: 6.6 mg via INTRAVENOUS
  Administered 2016-07-30: 10.8 mg via INTRAVENOUS
  Administered 2016-07-30: 4.8 mg via INTRAVENOUS
  Administered 2016-07-30: 7.8 mg via INTRAVENOUS
  Administered 2016-07-30: 3.6 mg via INTRAVENOUS
  Administered 2016-07-30: 04:00:00 via INTRAVENOUS
  Administered 2016-07-30: 25 mg via INTRAVENOUS
  Filled 2016-07-28 (×4): qty 25

## 2016-07-28 NOTE — Progress Notes (Signed)
SICKLE CELL SERVICE PROGRESS NOTE  Brittany Foster ZOX:[id number] DOB: 06/27/1985 DOA: 07/24/2016 PCP: Patient, [name]  Assessment/Plan: Principal Problem:   Sickle cell pain crisis (HCC)  1. Hb SS with Crisis: Increase PCA bolus dose to 0.6 and encourage increased use. Continue Toradol and keep IVF at [facility].  2. Anemia of Chronic Disease: Hb at baseline as of yesterday.  Pt reports adherence to Hydrea however dose of 500 mg sub-therapeutic.  Continue Hydrea at reported dose of 500 mg daily. Pt has an appointment with Dr. Barrie Foster on 6/14 at [facility]. Will defer any dose changes to Dr. Terrance Foster.  3. Metabolic Acidosis:Resolved. 4. Hypomagnesemia: Oral magnesium given. If still low tomorrow will consider IV replacement.  5. Hypokalemia: Potassium still mildly low. Will continue replacement.  6. Chronic Pain Syndrome. Pt has been taking Oxycodone and states that she is out of MS Contin. She has no proscriber at this time.  7. H/O Depression: On Cymbalta and Abilify. Continue.  8. H/O Migraine HA: Continue Topomax  Code Status: Full Code Family Communication: N/A Disposition Plan: Not yet ready for discharge  Brittany Foster A.  Pager [phone]. If 7PM-7AM, please contact night-coverage.  07/28/2016, 2:52 PM  LOS: 3 days   Interim History: Pt reports pain in hips, and back at an intensity of 7/10. She has used 40.96 mg of Dilaudid and 91/82: demands/deliveries in the last 24 hours.   Consultants:  None  Procedures:  None  Antibiotics:  None   Objective: Vitals:   07/28/16 0751 07/28/16 0947 07/28/16 1128 07/28/16 1418  BP:  (!) 98/51  99/65  Pulse:  84  95  Resp: 12 16 16 13   Temp:  98.7 F (37.1 C)  98.8 F (37.1 C)  TempSrc:  Oral  Oral  SpO2: 95% 92% 91% 96%  Weight:      Height:       Weight change:   Intake/Output Summary (Last 24 hours) at 07/28/16 1452 Last data filed at 07/28/16 1418  Gross per 24 hour  Intake            605.5 ml  Output             2725  ml  Net          -2119.5 ml     Physical Exam General: Alert, awake, oriented x3, in no acute distress. Well appearing. HEENT: [place]/AT PEERL, EOMI, anicteric Heart: Regular rate and rhythm, without murmurs, rubs, gallops, PMI non-displaced, no heaves or thrills on palpation.  Lungs: Clear to auscultation, no wheezing or rhonchi noted. No increased vocal fremitus resonant to percussion  Abdomen: Soft, nontender, nondistended, positive bowel sounds, no masses no hepatosplenomegaly noted.  Neuro: No focal neurological deficits noted cranial nerves II through XII grossly intact. Strength at functional baselinein bilateral upper and lower extremities. Musculoskeletal: No warmth swelling or erythema around joints, no spinal tenderness noted. Gait observed to be mildly antalgic. Psychiatric: Patient alert and oriented x3, good insight and cognition, good recent to remote recall.     Data Reviewed: Basic Metabolic Panel:  Recent Labs Lab 07/24/16 2158 07/25/16 0637 07/27/16 1140  NA 140 139 139  K 3.3* 3.5 3.4*  CL 113* 112* 111  CO2 21* 21* 24  GLUCOSE 124* 100* 151*  BUN 8 6 8   CREATININE 0.50 0.41* 0.38*  CALCIUM 8.8* 8.6* 8.3*  MG  --   --  1.9   Liver Function Tests:  Recent Labs Lab 07/24/16 2158  AST 51*  ALT 26  ALKPHOS 80  BILITOT 1.7*  PROT 7.0  ALBUMIN 3.7   No results for input(s): LIPASE, AMYLASE in the last 168 hours. No results for input(s): AMMONIA in the last 168 hours. CBC:  Recent Labs Lab 07/24/16 2158 07/25/16 0637 07/27/16 1140  WBC 15.5* 13.9* 10.3  NEUTROABS 8.9* 6.4 4.7  HGB 8.2* 8.2* 7.5*  HCT 23.8* 22.9* 21.7*  MCV 98.3 98.3 98.6  PLT 343 310 249   Cardiac Enzymes: No results for input(s): CKTOTAL, CKMB, CKMBINDEX, TROPONINI in the last 168 hours. BNP (last 3 results) No results for input(s): BNP in the last 8760 hours.  ProBNP (last 3 results) No results for input(s): PROBNP in the last 8760 hours.  CBG: No results for  input(s): GLUCAP in the last 168 hours.  No results found for this or any previous visit (from the past 240 hour(s)).   Studies: No results found.  Scheduled Meds: . ARIPiprazole  10 mg Oral Daily  . DULoxetine  60 mg Oral Daily  . enoxaparin (LOVENOX) injection  40 mg Subcutaneous Q24H  . folic acid  1 mg Oral Daily  . HYDROmorphone   Intravenous Q4H  . hydroxyurea  500 mg Oral Daily  . ketorolac  30 mg Intravenous Q6H  . L-glutamine  10 g Oral BID  . magnesium oxide  400 mg Oral BID  . medroxyPROGESTERone  150 mg Intramuscular Q90 days  . morphine  30 mg Oral Q12H  . potassium chloride  40 mEq Oral Daily  . senna-docusate  1 tablet Oral BID  . topiramate  100 mg Oral QHS   Continuous Infusions: . dextrose 5 % and 0.45% NaCl 10 mL (07/27/16 0957)    Principal Problem:   Sickle cell pain crisis (HCC)    In excess of 25 minutes spent during this visit. Greater than 50% involved face to face contact with the patient for assessment, counseling and coordination of care.

## 2016-07-29 DIAGNOSIS — R701 Abnormal plasma viscosity: Secondary | ICD-10-CM

## 2016-07-29 LAB — CBC WITH DIFFERENTIAL/PLATELET
BASOS ABS: 0.1 10*3/uL (ref 0.0–0.1)
Basophils Relative: 1 %
Eosinophils Absolute: 0.5 10*3/uL (ref 0.0–0.7)
Eosinophils Relative: 5 %
HEMATOCRIT: 17.7 % — AB (ref 36.0–46.0)
HEMOGLOBIN: 6.2 g/dL — AB (ref 12.0–15.0)
LYMPHS PCT: 30 %
Lymphs Abs: 2.8 10*3/uL (ref 0.7–4.0)
MCH: 34.8 pg — ABNORMAL HIGH (ref 26.0–34.0)
MCHC: 35 g/dL (ref 30.0–36.0)
MCV: 99.4 fL (ref 78.0–100.0)
MONOS PCT: 6 %
Monocytes Absolute: 0.6 10*3/uL (ref 0.1–1.0)
Neutro Abs: 5.4 10*3/uL (ref 1.7–7.7)
Neutrophils Relative %: 58 %
Platelets: 178 10*3/uL (ref 150–400)
RBC: 1.78 MIL/uL — AB (ref 3.87–5.11)
RDW: 21.9 % — ABNORMAL HIGH (ref 11.5–15.5)
WBC: 9.4 10*3/uL (ref 4.0–10.5)

## 2016-07-29 LAB — MAGNESIUM: Magnesium: 2.2 mg/dL (ref 1.7–2.4)

## 2016-07-29 LAB — BASIC METABOLIC PANEL
ANION GAP: 5 (ref 5–15)
BUN: 8 mg/dL (ref 6–20)
CO2: 17 mmol/L — ABNORMAL LOW (ref 22–32)
Calcium: 7 mg/dL — ABNORMAL LOW (ref 8.9–10.3)
Chloride: 119 mmol/L — ABNORMAL HIGH (ref 101–111)
Creatinine, Ser: 0.37 mg/dL — ABNORMAL LOW (ref 0.44–1.00)
GFR calc Af Amer: >60 mL/min (ref >60)
GFR calc non Af Amer: >60 mL/min (ref >60)
Glucose, Bld: 93 mg/dL (ref 65–99)
Potassium: 3.4 mmol/L — ABNORMAL LOW (ref 3.5–5.1)
SODIUM: 141 mmol/L (ref 135–145)

## 2016-07-29 LAB — RETICULOCYTES
RBC.: 1.78 MIL/uL — AB (ref 3.87–5.11)
RETIC COUNT ABSOLUTE: 258.1 10*3/uL — AB (ref 19.0–186.0)
Retic Ct Pct: 14.5 % — ABNORMAL HIGH (ref 0.4–3.1)

## 2016-07-29 LAB — LACTATE DEHYDROGENASE: LDH: 299 U/L — AB (ref 98–192)

## 2016-07-29 MED ORDER — OXYCODONE HCL 5 MG PO TABS
5.0000 mg | ORAL_TABLET | ORAL | Status: DC
  Administered 2016-07-29 – 2016-07-31 (×12): 5 mg via ORAL
  Filled 2016-07-29 (×12): qty 1

## 2016-07-29 MED ORDER — OXYCODONE-ACETAMINOPHEN 5-325 MG PO TABS
1.0000 | ORAL_TABLET | ORAL | Status: DC
  Administered 2016-07-29 – 2016-07-31 (×12): 1 via ORAL
  Filled 2016-07-29 (×12): qty 1

## 2016-07-29 MED ORDER — FOLIC ACID 1 MG PO TABS
2.0000 mg | ORAL_TABLET | Freq: Every day | ORAL | Status: DC
Start: 2016-07-30 — End: 2016-07-31
  Administered 2016-07-30 – 2016-07-31 (×2): 2 mg via ORAL
  Filled 2016-07-29 (×2): qty 2

## 2016-07-29 NOTE — Progress Notes (Signed)
CRITICAL VALUE ALERT  Critical Value:  Hgb 6.2  Date & Time Notied: 07/29/16 0530  Provider Notified: yes  Orders Received/Actions taken: No, ([facility])

## 2016-07-29 NOTE — Progress Notes (Signed)
Unable to draw blood from PICC line, lab notified.

## 2016-07-29 NOTE — Progress Notes (Signed)
SICKLE CELL SERVICE PROGRESS NOTE  Brittany Foster ZOX:[id number] DOB: October 25, 1985 DOA: 07/24/2016 PCP: Patient, [name]  Assessment/Plan: Principal Problem:   Sickle cell pain crisis (HCC)  1. Hb SS with Crisis: Schedule Percocet and continue PCA at current dose. Continue Toradol and keep IVF at [facility].  2. Anemia of Chronic Disease: Hb further decreased today. Will check LDH to evaluate for hemolysis. RPI 2.3% today. Discontinue continue Hydrea as may be contributing to decrease in reticulocytosis without any clear benefit at this dose. Pt has an appointment with Dr. Barrie Dunker on 6/14 at [facility]. Will defer any dose changes to Dr. Terrance Mass.  3. Hypomagnesemia: resolved. 4. Hypokalemia: Potassium still mildly low. Will continue replacement.  5. Chronic Pain Syndrome. Pt has been taking Oxycodone and states that she is out of MS Contin. She has no proscriber at this time.  6. H/O Depression: On Cymbalta and Abilify. Continue.  7. H/O Migraine HA: Continue Topomax  Code Status: Full Code Family Communication: N/A Disposition Plan: Anticipate discharge in 24-48 hours.  Brittany Foster A.  Pager [phone]. If 7PM-7AM, please contact night-coverage.  07/29/2016, 8:54 AM  LOS: 4 days   Interim History: Pt reports pain in hips, and back at an intensity of 6-7/10. She has used 42.55 mg of Dilaudid and 88/71: demands/deliveries in the last 24 hours. Pt ambulated down hallway today.  Consultants:  None  Procedures:  None  Antibiotics:  None   Objective: Vitals:   07/29/16 0000 07/29/16 0400 07/29/16 0500 07/29/16 0815  BP: (!) 100/56  (!) 103/56   Pulse: 86  87   Resp: 14 15 20 16   Temp: 98.3 F (36.8 C)  98.5 F (36.9 C)   TempSrc: Oral  Oral   SpO2: 92% 92% 98% 96%  Weight:      Height:       Weight change:   Intake/Output Summary (Last 24 hours) at 07/29/16 0854 Last data filed at 07/28/16 1734  Gross per 24 hour  Intake              600 ml  Output              900 ml   Net             -300 ml     Physical Exam General: Alert, awake, oriented x3, in no acute distress. Well appearing. HEENT: [place]/AT PEERL, EOMI, anicteric Heart: Regular rate and rhythm, without murmurs, rubs, gallops, PMI non-displaced, no heaves or thrills on palpation.  Lungs: Clear to auscultation, no wheezing or rhonchi noted. No increased vocal fremitus resonant to percussion  Abdomen: Soft, nontender, nondistended, positive bowel sounds, no masses no hepatosplenomegaly noted.  Neuro: No focal neurological deficits noted cranial nerves II through XII grossly intact. Strength at functional baselinein bilateral upper and lower extremities. Musculoskeletal: No warmth swelling or erythema around joints, no spinal tenderness noted. Gait observed to be mildly antalgic. Psychiatric: Patient alert and oriented x3, good insight and cognition, good recent to remote recall.     Data Reviewed: Basic Metabolic Panel:  Recent Labs Lab 07/24/16 2158 07/25/16 0637 07/27/16 1140 07/29/16 0452  NA 140 139 139 141  K 3.3* 3.5 3.4* 3.4*  CL 113* 112* 111 119*  CO2 21* 21* 24 17*  GLUCOSE 124* 100* 151* 93  BUN 8 6 8 8   CREATININE 0.50 0.41* 0.38* 0.37*  CALCIUM 8.8* 8.6* 8.3* 7.0*  MG  --   --  1.9  --    Liver  Function Tests:  Recent Labs Lab 07/24/16 2158  AST 51*  ALT 26  ALKPHOS 80  BILITOT 1.7*  PROT 7.0  ALBUMIN 3.7   No results for input(s): LIPASE, AMYLASE in the last 168 hours. No results for input(s): AMMONIA in the last 168 hours. CBC:  Recent Labs Lab 07/24/16 2158 07/25/16 0637 07/27/16 1140 07/29/16 0452  WBC 15.5* 13.9* 10.3 9.4  NEUTROABS 8.9* 6.4 4.7 5.4  HGB 8.2* 8.2* 7.5* 6.2*  HCT 23.8* 22.9* 21.7* 17.7*  MCV 98.3 98.3 98.6 99.4  PLT 343 310 249 178   Cardiac Enzymes: No results for input(s): CKTOTAL, CKMB, CKMBINDEX, TROPONINI in the last 168 hours. BNP (last 3 results) No results for input(s): BNP in the last 8760 hours.  ProBNP (last 3  results) No results for input(s): PROBNP in the last 8760 hours.  CBG: No results for input(s): GLUCAP in the last 168 hours.  No results found for this or any previous visit (from the past 240 hour(s)).   Studies: No results found.  Scheduled Meds: . ARIPiprazole  10 mg Oral Daily  . DULoxetine  60 mg Oral Daily  . enoxaparin (LOVENOX) injection  40 mg Subcutaneous Q24H  . folic acid  1 mg Oral Daily  . HYDROmorphone   Intravenous Q4H  . hydroxyurea  500 mg Oral Daily  . ketorolac  30 mg Intravenous Q6H  . L-glutamine  10 g Oral BID  . magnesium oxide  400 mg Oral BID  . medroxyPROGESTERone  150 mg Intramuscular Q90 days  . morphine  30 mg Oral Q12H  . potassium chloride  40 mEq Oral Daily  . senna-docusate  1 tablet Oral BID  . topiramate  100 mg Oral QHS   Continuous Infusions: . dextrose 5 % and 0.45% NaCl 10 mL (07/27/16 0957)    Principal Problem:   Sickle cell pain crisis (HCC)    In excess of 25 minutes spent during this visit. Greater than 50% involved face to face contact with the patient for assessment, counseling and coordination of care.

## 2016-07-30 DIAGNOSIS — F1120 Opioid dependence, uncomplicated: Secondary | ICD-10-CM

## 2016-07-30 DIAGNOSIS — F54 Psychological and behavioral factors associated with disorders or diseases classified elsewhere: Secondary | ICD-10-CM

## 2016-07-30 LAB — CBC WITH DIFFERENTIAL/PLATELET
BASOS PCT: 0 %
Basophils Absolute: 0.1 10*3/uL (ref 0.0–0.1)
EOS ABS: 0.5 10*3/uL (ref 0.0–0.7)
EOS PCT: 5 %
HCT: 21 % — ABNORMAL LOW (ref 36.0–46.0)
Hemoglobin: 7.4 g/dL — ABNORMAL LOW (ref 12.0–15.0)
LYMPHS ABS: 4 10*3/uL (ref 0.7–4.0)
Lymphocytes Relative: 36 %
MCH: 34.4 pg — AB (ref 26.0–34.0)
MCHC: 35.2 g/dL (ref 30.0–36.0)
MCV: 97.7 fL (ref 78.0–100.0)
MONO ABS: 0.9 10*3/uL (ref 0.1–1.0)
MONOS PCT: 8 %
NEUTROS PCT: 51 %
Neutro Abs: 5.7 10*3/uL (ref 1.7–7.7)
PLATELETS: 251 10*3/uL (ref 150–400)
RBC: 2.15 MIL/uL — ABNORMAL LOW (ref 3.87–5.11)
RDW: 20.9 % — AB (ref 11.5–15.5)
WBC: 11.1 10*3/uL — ABNORMAL HIGH (ref 4.0–10.5)

## 2016-07-30 LAB — BASIC METABOLIC PANEL
Anion gap: 4 — ABNORMAL LOW (ref 5–15)
BUN: 16 mg/dL (ref 6–20)
CALCIUM: 8.6 mg/dL — AB (ref 8.9–10.3)
CHLORIDE: 112 mmol/L — AB (ref 101–111)
CO2: 24 mmol/L (ref 22–32)
CREATININE: 0.61 mg/dL (ref 0.44–1.00)
GFR calc Af Amer: >60 mL/min (ref >60)
GFR calc non Af Amer: >60 mL/min (ref >60)
Glucose, Bld: 126 mg/dL — ABNORMAL HIGH (ref 65–99)
Potassium: 4.4 mmol/L (ref 3.5–5.1)
Sodium: 140 mmol/L (ref 135–145)

## 2016-07-30 LAB — RETICULOCYTES
RBC.: 2.15 MIL/uL — AB (ref 3.87–5.11)
RETIC CT PCT: 10.7 % — AB (ref 0.4–3.1)
Retic Count, Absolute: 230.1 10*3/uL — ABNORMAL HIGH (ref 19.0–186.0)

## 2016-07-30 MED ORDER — SODIUM CHLORIDE 0.9% FLUSH: 10.0000 mL | INTRAVENOUS | Status: DC | PRN

## 2016-07-30 MED ORDER — HYDROMORPHONE 1 MG/ML IV SOLN
INTRAVENOUS | Status: DC
  Administered 2016-07-30: 13.9 mg via INTRAVENOUS
  Administered 2016-07-31: 4 mg via INTRAVENOUS
  Administered 2016-07-31: 4.5 mg via INTRAVENOUS
  Administered 2016-07-31: 5.5 mg via INTRAVENOUS
  Administered 2016-07-31: 25 mg via INTRAVENOUS
  Administered 2016-07-31: 4 mg via INTRAVENOUS
  Filled 2016-07-30 (×2): qty 25

## 2016-07-30 NOTE — Progress Notes (Signed)
SICKLE CELL SERVICE PROGRESS NOTE  Brittany Foster RUE:[id number] DOB: 07/14/1985 DOA: 07/24/2016 PCP: Patient, [name]  Assessment/Plan: Principal Problem:   Sickle cell pain crisis (HCC)  1. Hb SS with crisis: Pt has had no improvement with 5 days of appropriate therapy thus I will start to de-escalate therapy by weaning PCA. Continue MS Contin and Percocet.  2. Metabolic Acidosis: resolved with IVF. 3. Anemia of chronic Disease: Hb at baseline. Hb improved after discontinuing Hydrea. Albeit the dose was an allopathic dose and did not meet recommended dose standards of even the starting dose of 15 mg /kg. I feel that this dose placed the patient at risk for adverse effects of the medication without any benefits. 4. Chronic pain: Pt has worsening chronic pain the modulators of which are unclear. This chronic pain is now at a level that is uncontrolled with oral analgesics and it appears that patient has been going from one hospital to another to receive IV Opiates to try to afford relief. She was at a residential Opiate treatment program but did not complete the program. She is continued on MS Contin and Percocet however without assistance to manage the chronic pain, I am afraid that she present acutely for pain management within 24-48 hours as she starts to experience withdrawal. However she is not interested in any other treatment than to receive IV Opiates.  5. Gait Abnormality: Pt reports that she cannot ambulate and thus cannot go home. She however also reports that she has been ambulating to the bathroom. She ambulated down the hall this afternoon and then told the nurse that she was unable to walk anymore. I have discussed PT eval and have placed the evaluation but patient is adamant that she does not need PT or an assistive device.  6. H/O Depression: Continue Cymbalta and Abilify. 7. H/O Migraine HA: Continue Topamax.   Code Status: Full Code Family Communication: N/A Disposition Plan:  Plan discharge for next 24-48 hours.  [name],[name]  Pager [phone]. If 7PM-7AM, please contact night-coverage.  07/30/2016, 5:19 PM  LOS: 5 days   Interim History: Pt has used 39.6 mg of IV Dilaudid which is MME of 792 mg Morphine with 66/66: demands/deliveries in the last 24 hours.   Discussed with patient that currently she has no acute medical risk to justify continued hospitalization and in light of her achieving  no relief from the hight doses of opiates in the last 5 days,  the plan is to wean the opiates and discharge in the next 24-48 hours.  The patient became verbally abusive and argumentative and went on to state that she has not been on any high doses of medications while in the hospital and demanding that she receive 4 mg Dilaudid by IVP.  I informed patient of her MME on the PCA for the last 4 days and she was surprised that she had received upwards of 790 MME in the last 24 hours in addition to the MS Contin and Oxycodone as she didn't feel that she had received any medication. I explained that if she has been receiving doses of medication this high  for 5 days without any relief, the appropriate medical plan is to start decreasing her medications. She also stated that she does get relief from her pain at home without any medications which supports the fact that her pain is improved not because of opiate medications but may in fact improve when she is not taking high doses of Opiates. She has  had multiple episodes of withdrawal and is unable to see that it is not to her medical benefit to have prolonged exposure to high doses of Opiates. Nevertheless I have explained the plan for weaning of Opiates and PT evaluation to the patient and although not pleased that I will not give her the Dilaudid at the requested dose and route she is willing to remain to have her opiates appropriately weaned.      Consultants:  None  Procedures:  None  Antibiotics:  None    Objective: Vitals:   07/30/16 1000 07/30/16 1238 07/30/16 1421 07/30/16 1534  BP: 93/61  103/62   Pulse: 99  99   Resp: 14 12 16 14   Temp: 98.5 F (36.9 C)  98.4 F (36.9 C)   TempSrc: Oral  Oral   SpO2: 96% 95% 95% 95%  Weight:      Height:       Weight change:   Intake/Output Summary (Last 24 hours) at 07/30/16 1719 Last data filed at 07/30/16 1002  Gross per 24 hour  Intake           1083.7 ml  Output              800 ml  Net            283.7 ml     Physical Exam General: Alert, awake, oriented x3, in no acute distress. Sitting in bed in [place] style sitting position HEENT: [place]/AT PEERL, EOMI, anicteric Neck: Trachea midline,  no masses, no thyromegal,y no JVD, no carotid bruit Heart: Regular rate and rhythm, without murmurs, rubs, gallops, PMI non-displaced, no heaves or thrills on palpation.  Lungs: Clear to auscultation, no wheezing or rhonchi noted. No increased vocal fremitus resonant to percussion  Abdomen: Soft, nontender, nondistended, positive bowel sounds, no masses no hepatosplenomegaly noted.  Neuro: No focal neurological deficits noted cranial nerves II through XII grossly intact.  Strength at baseline in bilateral upper and lower extremities. Musculoskeletal: No warm swelling or erythema around joints, no spinal tenderness noted. Psychiatric: Patient alert and oriented x3, good insight and cognition, good recent to remote recall. .    Data Reviewed: Basic Metabolic Panel:  Recent Labs Lab 07/24/16 2158 07/25/16 0637 07/27/16 1140 07/29/16 0452 07/29/16 1239 07/30/16 0509  NA 140 139 139 141  --  140  K 3.3* 3.5 3.4* 3.4*  --  4.4  CL 113* 112* 111 119*  --  112*  CO2 21* 21* 24 17*  --  24  GLUCOSE 124* 100* 151* 93  --  126*  BUN 8 6 8 8   --  16  CREATININE 0.50 0.41* 0.38* 0.37*  --  0.61  CALCIUM 8.8* 8.6* 8.3* 7.0*  --  8.6*  MG  --   --  1.9  --  2.2  --     Liver Function Tests:  Recent Labs Lab 07/24/16 2158  AST 51*  ALT 26  ALKPHOS 80  BILITOT 1.7*  PROT 7.0  ALBUMIN 3.7   No results for input(s): LIPASE, AMYLASE in the last 168 hours. No results for input(s): AMMONIA in the last 168 hours. CBC:  Recent Labs Lab 07/24/16 2158 07/25/16 0637 07/27/16 1140 07/29/16 0452 07/30/16 0509  WBC 15.5* 13.9* 10.3 9.4 11.1*  NEUTROABS 8.9* 6.4 4.7 5.4 5.7  HGB 8.2* 8.2* 7.5* 6.2* 7.4*  HCT 23.8* 22.9* 21.7* 17.7* 21.0*  MCV 98.3 98.3 98.6 99.4 97.7  PLT 343 310 249 178 251  Cardiac Enzymes: No results for input(s): CKTOTAL, CKMB, CKMBINDEX, TROPONINI in the last 168 hours. BNP (last 3 results) No results for input(s): BNP in the last 8760 hours.  ProBNP (last 3 results) No results for input(s): PROBNP in the last 8760 hours.  CBG: No results for input(s): GLUCAP in the last 168 hours.  No results found for this or any previous visit (from the past 240 hour(s)).   Studies: No results found.  Scheduled Meds: . ARIPiprazole  10 mg Oral Daily  . DULoxetine  60 mg Oral Daily  . enoxaparin (LOVENOX) injection  40 mg Subcutaneous Q24H  . folic acid  2 mg Oral Daily  . HYDROmorphone   Intravenous Q4H  . L-glutamine  10 g Oral BID  . magnesium oxide  400 mg Oral BID  . medroxyPROGESTERone  150 mg Intramuscular Q90 days  . morphine  30 mg Oral Q12H  . oxyCODONE-acetaminophen  1 tablet Oral Q4H   And  . oxyCODONE  5 mg Oral Q4H  . potassium chloride  40 mEq Oral Daily  . senna-docusate  1 tablet Oral BID  . topiramate  100 mg Oral QHS   Continuous Infusions: . dextrose 5 % and 0.45% NaCl 10 mL (07/27/16 0957)    Principal Problem:   Sickle cell pain crisis (HCC)       In excess of 40 minutes spent during this visit. Greater than 50% involved face to face contact with the patient for assessment, counseling and coordination of care.

## 2016-07-30 NOTE — Care Management Note (Signed)
Case Management Note  Patient Details  Name: Brittany CampbellMiranda Lingle MRN: [id number] Date of Birth: 04/19/1985  Subjective/Objective:   31 yo admitted with Sickle Cell Crisis                 Action/Plan: From home and to dc home.  Expected Discharge Date:                  Expected Discharge Plan:  Home/Self Care  In-House Referral:     Discharge planning Services  CM Consult  Post Acute Care Choice:    Choice offered to:     DME Arranged:    DME Agency:     HH Arranged:    HH Agency:     Status of Service:  In process, will continue to follow  If discussed at Long Length of Stay Meetings, dates discussed:    Additional CommentsBartholome Bill:  Noreene Boreman H, RN 07/30/2016, 11:21 AM [phone]

## 2016-07-31 MED ORDER — HYDROMORPHONE HCL 1 MG/ML IJ SOLN
1.0000 mg | Freq: Once | INTRAMUSCULAR | Status: AC
  Administered 2016-07-31: 1 mg via INTRAVENOUS
  Filled 2016-07-31: qty 1

## 2016-07-31 MED ORDER — HEPARIN SOD (PORK) LOCK FLUSH 100 UNIT/ML IV SOLN
500.0000 [IU] | Freq: Once | INTRAVENOUS | Status: DC
  Filled 2016-07-31: qty 5

## 2016-07-31 NOTE — Evaluation (Signed)
Physical Therapy Evaluation Patient Details Name: Brittany Foster MRN: [id number] DOB: 1985/12/15 Today's Date: 07/31/2016   History of Present Illness  31 yo female admitted with sickle cell crisis. Hx of sickle cell, chronic pain syndrome, depression  Clinical Impression  On eval, pt was Min guard assist for mobility. She walked ~180 feet in the hallway. She used RW for part of distance then transitioned to IV pole for rest of distance. Pt rated pain 7/10 with activity. Recommend ambulation in hallway as tolerated.     Follow Up Recommendations No PT follow up    Equipment Recommendations  None recommended by PT (pt already has a walker and a cane)    Recommendations for Other Services       Precautions / Restrictions Precautions Precautions: Fall Restrictions Weight Bearing Restrictions: No      Mobility  Bed Mobility Overal bed mobility: Independent                Transfers Overall transfer level: Modified independent Equipment used: None                Ambulation/Gait Ambulation/Gait assistance: Min guard Ambulation Distance (Feet): 180 Feet Assistive device: Rolling walker (2 wheeled) (IV pole) Gait Pattern/deviations: Decreased step length - right;Decreased step length - left;Decreased stride length     General Gait Details: walked 90 feet with RW then transitioned to using only the IV pole for support. Pt then walked another 90 feet. No LOB. No buckling. No "locking up" of knees.  HR 112 bpm. O2 WNL.   Stairs            Wheelchair Mobility    Modified Rankin (Stroke Patients Only)       Balance                                             Pertinent Vitals/Pain Pain Assessment: 0-10 Pain Score: 7  Pain Location: bil LEs Pain Descriptors / Indicators: Aching;Sore Pain Intervention(s): Monitored during session    Home Living Family/patient expects to be discharged to:: Private residence Living Arrangements:  Children     Home Access: Stairs to enter Entrance Stairs-Rails: Right Entrance Stairs-Number of Steps: flight Home Layout: One level Home Equipment: Environmental consultant - 2 wheels;Cane - single point      Prior Function Level of Independence: Independent               Hand Dominance        Extremity/Trunk Assessment   Upper Extremity Assessment Upper Extremity Assessment: Overall WFL for tasks assessed    Lower Extremity Assessment Lower Extremity Assessment: Generalized weakness (limited mostly by pain)    Cervical / Trunk Assessment Cervical / Trunk Assessment: Normal  Communication   Communication: No difficulties  Cognition Arousal/Alertness: Awake/alert Behavior During Therapy: WFL for tasks assessed/performed Overall Cognitive Status: Within Functional Limits for tasks assessed                                        General Comments      Exercises     Assessment/Plan    PT Assessment Patient needs continued PT services  PT Problem List Decreased mobility;Pain       PT Treatment Interventions Functional mobility training;Balance training;DME instruction;Therapeutic activities;Patient/family education;Gait training  PT Goals (Current goals can be found in the Care Plan section)  Acute Rehab PT Goals Patient Stated Goal: less pain.  PT Goal Formulation: With patient Time For Goal Achievement: 08/14/16 Potential to Achieve Goals: Good    Frequency Min 3X/week   Barriers to discharge        Co-evaluation               AM-PAC PT "6 Clicks" Daily Activity  Outcome Measure Difficulty turning over in bed (including adjusting bedclothes, sheets and blankets)?: None Difficulty moving from lying on back to sitting on the side of the bed? : None Difficulty sitting down on and standing up from a chair with arms (e.g., wheelchair, bedside commode, etc,.)?: A Little Help needed moving to and from a bed to chair (including a wheelchair)?:  A Little Help needed walking in hospital room?: A Little Help needed climbing 3-5 steps with a railing? : A Little 6 Click Score: 20    End of Session   Activity Tolerance: Patient limited by pain Patient left: in bed;with call bell/phone within reach   PT Visit Diagnosis: Muscle weakness (generalized) (M62.81);Difficulty in walking, not elsewhere classified (R26.2)    Time: [id number] PT Time Calculation (min) (ACUTE ONLY): 19 min   Charges:   PT Evaluation $PT Eval Low Complexity: 1 Procedure     PT G Codes:         Rebeca AlertJannie Esmirna Ravan, MPT Pager: [phone]

## 2016-07-31 NOTE — Progress Notes (Signed)
Wasted 16 ml of dilaudid 1mg /ml pca, patient wants to go home , DR Ashley RoyaltyMatthews said she will be able to see her in maybe over an hour , Md has to go to ED.Patient said she won't wait for MD and refused to sign AMA form, left AMA, Offered patient to change the dressing on her central line, patient refused,

## 2016-08-05 ENCOUNTER — Telehealth: Payer: Self-pay | Admitting: General Practice

## 2016-08-05 NOTE — Telephone Encounter (Signed)
Pt. Called requesting a refill on morphine (MS CONTIN) 30 MG 12 hr tablet  Please f/u with pt.

## 2016-08-05 NOTE — Telephone Encounter (Signed)
Please let the patient know, we will not refill this medication going forward based on previous discussions termination of pain contract

## 2016-08-06 ENCOUNTER — Encounter (HOSPITAL_COMMUNITY): Payer: Self-pay | Admitting: Emergency Medicine

## 2016-08-06 ENCOUNTER — Emergency Department (HOSPITAL_COMMUNITY): Payer: Medicaid Other

## 2016-08-06 ENCOUNTER — Inpatient Hospital Stay (HOSPITAL_COMMUNITY)
Admission: EM | Admit: 2016-08-06 | Discharge: 2016-08-12 | DRG: 391 | Disposition: A | Discharge status: Home / Self Care | Payer: Medicaid Other | Attending: Internal Medicine | Admitting: Internal Medicine

## 2016-08-06 DIAGNOSIS — F1190 Opioid use, unspecified, uncomplicated: Secondary | ICD-10-CM | POA: Diagnosis not present

## 2016-08-06 DIAGNOSIS — E876 Hypokalemia: Secondary | ICD-10-CM | POA: Diagnosis present

## 2016-08-06 DIAGNOSIS — Z9049 Acquired absence of other specified parts of digestive tract: Secondary | ICD-10-CM | POA: Diagnosis not present

## 2016-08-06 DIAGNOSIS — D5700 Hb-SS disease with crisis, unspecified: Secondary | ICD-10-CM | POA: Diagnosis present

## 2016-08-06 DIAGNOSIS — F1124 Opioid dependence with opioid-induced mood disorder: Secondary | ICD-10-CM | POA: Diagnosis not present

## 2016-08-06 DIAGNOSIS — G43909 Migraine, unspecified, not intractable, without status migrainosus: Secondary | ICD-10-CM | POA: Diagnosis present

## 2016-08-06 DIAGNOSIS — R1115 Cyclical vomiting syndrome unrelated to migraine: Secondary | ICD-10-CM

## 2016-08-06 DIAGNOSIS — Z87891 Personal history of nicotine dependence: Secondary | ICD-10-CM

## 2016-08-06 DIAGNOSIS — R109 Unspecified abdominal pain: Secondary | ICD-10-CM

## 2016-08-06 DIAGNOSIS — G8929 Other chronic pain: Secondary | ICD-10-CM | POA: Diagnosis present

## 2016-08-06 DIAGNOSIS — D638 Anemia in other chronic diseases classified elsewhere: Secondary | ICD-10-CM | POA: Diagnosis present

## 2016-08-06 DIAGNOSIS — F1120 Opioid dependence, uncomplicated: Secondary | ICD-10-CM | POA: Diagnosis present

## 2016-08-06 DIAGNOSIS — K2970 Gastritis, unspecified, without bleeding: Secondary | ICD-10-CM | POA: Diagnosis present

## 2016-08-06 DIAGNOSIS — Z79891 Long term (current) use of opiate analgesic: Secondary | ICD-10-CM | POA: Diagnosis not present

## 2016-08-06 DIAGNOSIS — K529 Noninfective gastroenteritis and colitis, unspecified: Secondary | ICD-10-CM | POA: Diagnosis present

## 2016-08-06 DIAGNOSIS — R1010 Upper abdominal pain, unspecified: Secondary | ICD-10-CM | POA: Diagnosis present

## 2016-08-06 DIAGNOSIS — K2980 Duodenitis without bleeding: Secondary | ICD-10-CM | POA: Diagnosis present

## 2016-08-06 DIAGNOSIS — Z79899 Other long term (current) drug therapy: Secondary | ICD-10-CM | POA: Diagnosis not present

## 2016-08-06 DIAGNOSIS — F339 Major depressive disorder, recurrent, unspecified: Secondary | ICD-10-CM | POA: Diagnosis present

## 2016-08-06 DIAGNOSIS — G43A0 Cyclical vomiting, not intractable: Secondary | ICD-10-CM | POA: Diagnosis not present

## 2016-08-06 DIAGNOSIS — Z765 Malingerer [conscious simulation]: Secondary | ICD-10-CM | POA: Diagnosis not present

## 2016-08-06 DIAGNOSIS — R1013 Epigastric pain: Secondary | ICD-10-CM | POA: Diagnosis not present

## 2016-08-06 DIAGNOSIS — R110 Nausea: Secondary | ICD-10-CM | POA: Diagnosis not present

## 2016-08-06 DIAGNOSIS — R112 Nausea with vomiting, unspecified: Secondary | ICD-10-CM | POA: Diagnosis present

## 2016-08-06 DIAGNOSIS — E878 Other disorders of electrolyte and fluid balance, not elsewhere classified: Secondary | ICD-10-CM | POA: Diagnosis not present

## 2016-08-06 DIAGNOSIS — K2900 Acute gastritis without bleeding: Secondary | ICD-10-CM | POA: Diagnosis not present

## 2016-08-06 LAB — CBC WITH DIFFERENTIAL/PLATELET
BASOS PCT: 0 %
Basophils Absolute: 0 10*3/uL (ref 0.0–0.1)
Eosinophils Absolute: 0.1 10*3/uL (ref 0.0–0.7)
Eosinophils Relative: 0 %
HEMATOCRIT: 25.3 % — AB (ref 36.0–46.0)
HEMOGLOBIN: 9 g/dL — AB (ref 12.0–15.0)
LYMPHS ABS: 5 10*3/uL — AB (ref 0.7–4.0)
Lymphocytes Relative: 31 %
MCH: 33.7 pg (ref 26.0–34.0)
MCHC: 35.6 g/dL (ref 30.0–36.0)
MCV: 94.8 fL (ref 78.0–100.0)
MONO ABS: 1.5 10*3/uL — AB (ref 0.1–1.0)
MONOS PCT: 9 %
NEUTROS ABS: 9.5 10*3/uL — AB (ref 1.7–7.7)
NEUTROS PCT: 59 %
Platelets: 395 10*3/uL (ref 150–400)
RBC: 2.67 MIL/uL — ABNORMAL LOW (ref 3.87–5.11)
RDW: 21 % — AB (ref 11.5–15.5)
WBC: 16.1 10*3/uL — ABNORMAL HIGH (ref 4.0–10.5)

## 2016-08-06 LAB — COMPREHENSIVE METABOLIC PANEL
ALBUMIN: 4.4 g/dL (ref 3.5–5.0)
ALK PHOS: 89 U/L (ref 38–126)
ALT: 32 U/L (ref 14–54)
AST: 47 U/L — ABNORMAL HIGH (ref 15–41)
Anion gap: 9 (ref 5–15)
BUN: 10 mg/dL (ref 6–20)
CO2: 21 mmol/L — AB (ref 22–32)
CREATININE: 0.62 mg/dL (ref 0.44–1.00)
Calcium: 9.2 mg/dL (ref 8.9–10.3)
Chloride: 108 mmol/L (ref 101–111)
GFR calc Af Amer: >60 mL/min (ref >60)
GFR calc non Af Amer: >60 mL/min (ref >60)
GLUCOSE: 105 mg/dL — AB (ref 65–99)
Potassium: 2.6 mmol/L — CL (ref 3.5–5.1)
SODIUM: 138 mmol/L (ref 135–145)
Total Bilirubin: 2.8 mg/dL — ABNORMAL HIGH (ref 0.3–1.2)
Total Protein: 7.8 g/dL (ref 6.5–8.1)

## 2016-08-06 LAB — RETICULOCYTES
RBC.: 2.6 MIL/uL — AB (ref 3.87–5.11)
Retic Ct Pct: >23 % — ABNORMAL HIGH (ref 0.4–3.1)

## 2016-08-06 LAB — URINALYSIS, ROUTINE W REFLEX MICROSCOPIC
BILIRUBIN URINE: NEGATIVE
Glucose, UA: NEGATIVE mg/dL
Hgb urine dipstick: NEGATIVE
Ketones, ur: NEGATIVE mg/dL
Nitrite: NEGATIVE
Protein, ur: NEGATIVE mg/dL
SPECIFIC GRAVITY, URINE: 1.013 (ref 1.005–1.030)
pH: 6 (ref 5.0–8.0)

## 2016-08-06 LAB — MAGNESIUM: MAGNESIUM: 1.9 mg/dL (ref 1.7–2.4)

## 2016-08-06 LAB — LIPASE, BLOOD: LIPASE: 31 U/L (ref 11–51)

## 2016-08-06 LAB — PREGNANCY, URINE: Preg Test, Ur: NEGATIVE

## 2016-08-06 MED ORDER — POLYETHYLENE GLYCOL 3350 17 G PO PACK: 17.0000 g | PACK | Freq: Every day | ORAL | Status: DC | PRN

## 2016-08-06 MED ORDER — POTASSIUM CHLORIDE 10 MEQ/100ML IV SOLN
10.0000 meq | INTRAVENOUS | Status: AC
  Administered 2016-08-06 (×3): 10 meq via INTRAVENOUS
  Filled 2016-08-06 (×2): qty 100

## 2016-08-06 MED ORDER — HYDROXYUREA 500 MG PO CAPS
500.0000 mg | ORAL_CAPSULE | Freq: Every day | ORAL | Status: DC
  Administered 2016-08-07 – 2016-08-12 (×6): 500 mg via ORAL
  Filled 2016-08-06 (×6): qty 1

## 2016-08-06 MED ORDER — TOPIRAMATE 100 MG PO TABS
50.0000 mg | ORAL_TABLET | Freq: Two times a day (BID) | ORAL | Status: DC
  Administered 2016-08-07 – 2016-08-12 (×12): 50 mg via ORAL
  Filled 2016-08-06 (×12): qty 1

## 2016-08-06 MED ORDER — PROMETHAZINE HCL 25 MG/ML IJ SOLN
12.5000 mg | Freq: Once | INTRAMUSCULAR | Status: AC
  Administered 2016-08-06: 12.5 mg via INTRAVENOUS
  Filled 2016-08-06: qty 1

## 2016-08-06 MED ORDER — POTASSIUM CHLORIDE CRYS ER 20 MEQ PO TBCR
20.0000 meq | EXTENDED_RELEASE_TABLET | Freq: Every day | ORAL | Status: DC
  Administered 2016-08-07 – 2016-08-10 (×4): 20 meq via ORAL
  Filled 2016-08-06 (×4): qty 1

## 2016-08-06 MED ORDER — KETOROLAC TROMETHAMINE 30 MG/ML IJ SOLN
30.0000 mg | INTRAMUSCULAR | Status: AC
  Administered 2016-08-06: 30 mg via INTRAVENOUS
  Filled 2016-08-06: qty 1

## 2016-08-06 MED ORDER — DULOXETINE HCL 30 MG PO CPEP
60.0000 mg | ORAL_CAPSULE | Freq: Every day | ORAL | Status: DC
  Administered 2016-08-07 – 2016-08-12 (×6): 60 mg via ORAL
  Filled 2016-08-06 (×6): qty 2

## 2016-08-06 MED ORDER — SODIUM CHLORIDE 0.9% FLUSH: 9.0000 mL | INTRAVENOUS | Status: DC | PRN

## 2016-08-06 MED ORDER — ARIPIPRAZOLE 10 MG PO TABS
10.0000 mg | ORAL_TABLET | Freq: Every day | ORAL | Status: DC
  Administered 2016-08-07 – 2016-08-12 (×6): 10 mg via ORAL
  Filled 2016-08-06 (×6): qty 1

## 2016-08-06 MED ORDER — HYDROMORPHONE HCL 1 MG/ML IJ SOLN
2.0000 mg | INTRAMUSCULAR | Status: DC | PRN
  Administered 2016-08-06: 2 mg via INTRAVENOUS
  Filled 2016-08-06: qty 2

## 2016-08-06 MED ORDER — ONDANSETRON HCL 4 MG/2ML IJ SOLN: 4.0000 mg | Freq: Four times a day (QID) | INTRAMUSCULAR | Status: DC | PRN

## 2016-08-06 MED ORDER — DIPHENHYDRAMINE HCL 50 MG/ML IJ SOLN
25.0000 mg | Freq: Once | INTRAMUSCULAR | Status: AC
  Administered 2016-08-06: 25 mg via INTRAVENOUS
  Filled 2016-08-06: qty 1

## 2016-08-06 MED ORDER — SENNOSIDES-DOCUSATE SODIUM 8.6-50 MG PO TABS
1.0000 | ORAL_TABLET | Freq: Two times a day (BID) | ORAL | Status: DC
  Administered 2016-08-08 – 2016-08-10 (×2): 1 via ORAL
  Filled 2016-08-06 (×10): qty 1

## 2016-08-06 MED ORDER — L-GLUTAMINE ORAL POWDER
10.0000 g | PACK | Freq: Two times a day (BID) | ORAL | Status: DC
  Administered 2016-08-07 (×2): 10 g via ORAL
  Filled 2016-08-06 (×12): qty 2

## 2016-08-06 MED ORDER — DIPHENHYDRAMINE HCL 25 MG PO CAPS: 25.0000 mg | ORAL_CAPSULE | ORAL | Status: DC | PRN

## 2016-08-06 MED ORDER — HYDROMORPHONE HCL 2 MG PO TABS
4.0000 mg | ORAL_TABLET | ORAL | Status: DC | PRN
  Administered 2016-08-06: 4 mg via ORAL
  Filled 2016-08-06: qty 2

## 2016-08-06 MED ORDER — NALOXONE HCL 0.4 MG/ML IJ SOLN: 0.4000 mg | INTRAMUSCULAR | Status: DC | PRN

## 2016-08-06 MED ORDER — DEXTROSE-NACL 5-0.45 % IV SOLN
INTRAVENOUS | Status: DC
  Administered 2016-08-06: 17:00:00 via INTRAVENOUS
  Administered 2016-08-07: 125 mL/h via INTRAVENOUS
  Administered 2016-08-08 – 2016-08-10 (×4): via INTRAVENOUS

## 2016-08-06 MED ORDER — IOPAMIDOL (ISOVUE-300) INJECTION 61%
100.0000 mL | Freq: Once | INTRAVENOUS | Status: AC | PRN
  Administered 2016-08-06: 100 mL via INTRAVENOUS

## 2016-08-06 MED ORDER — HYDROMORPHONE 1 MG/ML IV SOLN
INTRAVENOUS | Status: DC
  Administered 2016-08-07: 6.38 mg via INTRAVENOUS
  Administered 2016-08-07: 2.36 mg via INTRAVENOUS
  Administered 2016-08-07: 1.18 mg via INTRAVENOUS
  Administered 2016-08-07: 16.5 mg via INTRAVENOUS
  Administered 2016-08-07 (×2): 25 mg via INTRAVENOUS
  Administered 2016-08-07: 6.49 mg via INTRAVENOUS
  Administered 2016-08-07: 2.95 mg via INTRAVENOUS
  Administered 2016-08-08: 2.56 mg via INTRAVENOUS
  Administered 2016-08-08: 25 mg via INTRAVENOUS
  Administered 2016-08-08: 5.79 mg via INTRAVENOUS
  Administered 2016-08-08: 3.45 mg via INTRAVENOUS
  Administered 2016-08-08: 2.95 mg via INTRAVENOUS
  Administered 2016-08-08: 4.13 mg via INTRAVENOUS
  Administered 2016-08-09: 6.3 mg via INTRAVENOUS
  Administered 2016-08-09: 25 mg via INTRAVENOUS
  Administered 2016-08-09: 4.72 mg via INTRAVENOUS
  Administered 2016-08-09: 4.13 mg via INTRAVENOUS
  Administered 2016-08-09: 6.49 mg via INTRAVENOUS
  Administered 2016-08-09: 5.31 mg via INTRAVENOUS
  Administered 2016-08-09: 25 mg via INTRAVENOUS
  Administered 2016-08-10: 7.58 mg via INTRAVENOUS
  Administered 2016-08-10: 5.31 mg via INTRAVENOUS
  Administered 2016-08-10: 7.25 mg via INTRAVENOUS
  Administered 2016-08-10: 25 mg via INTRAVENOUS
  Filled 2016-08-06 (×6): qty 25

## 2016-08-06 MED ORDER — FOLIC ACID 1 MG PO TABS
1.0000 mg | ORAL_TABLET | Freq: Every day | ORAL | Status: DC
  Administered 2016-08-07 – 2016-08-12 (×6): 1 mg via ORAL
  Filled 2016-08-06 (×5): qty 1

## 2016-08-06 MED ORDER — IOPAMIDOL (ISOVUE-300) INJECTION 61%
INTRAVENOUS | Status: AC
  Filled 2016-08-06: qty 100

## 2016-08-06 MED ORDER — POTASSIUM CHLORIDE CRYS ER 20 MEQ PO TBCR
40.0000 meq | EXTENDED_RELEASE_TABLET | Freq: Once | ORAL | Status: AC
  Administered 2016-08-06: 40 meq via ORAL
  Filled 2016-08-06: qty 2

## 2016-08-06 MED ORDER — MORPHINE SULFATE ER 30 MG PO TBCR
30.0000 mg | EXTENDED_RELEASE_TABLET | Freq: Two times a day (BID) | ORAL | Status: DC
  Administered 2016-08-07 – 2016-08-12 (×12): 30 mg via ORAL
  Filled 2016-08-06 (×12): qty 1

## 2016-08-06 MED ORDER — MORPHINE SULFATE ER 30 MG PO TBCR
30.0000 mg | EXTENDED_RELEASE_TABLET | Freq: Once | ORAL | Status: AC
  Administered 2016-08-06: 30 mg via ORAL
  Filled 2016-08-06: qty 2

## 2016-08-06 MED ORDER — ENOXAPARIN SODIUM 40 MG/0.4ML ~~LOC~~ SOLN
40.0000 mg | Freq: Every day | SUBCUTANEOUS | Status: DC
  Filled 2016-08-06 (×3): qty 0.4

## 2016-08-06 MED ORDER — PROMETHAZINE HCL 25 MG PO TABS: 25.0000 mg | ORAL_TABLET | ORAL | Status: DC | PRN

## 2016-08-06 MED ORDER — KETOROLAC TROMETHAMINE 15 MG/ML IJ SOLN
15.0000 mg | Freq: Four times a day (QID) | INTRAMUSCULAR | Status: AC
  Administered 2016-08-07 (×5): 15 mg via INTRAVENOUS
  Filled 2016-08-06 (×5): qty 1

## 2016-08-06 NOTE — ED Notes (Signed)
Made request for urine sample, patient unable to provide one at this time

## 2016-08-06 NOTE — ED Notes (Signed)
PATIENT REFUSES EKG

## 2016-08-06 NOTE — ED Notes (Signed)
Critical K of 2.6 EDP notified

## 2016-08-06 NOTE — ED Triage Notes (Signed)
Patient here from home with complaints of sickle cell pain crisis. Increased pain today bilateral hips and back. Denies chest pain. Nausea/vomiting. States that she took all her home meds with no relief.

## 2016-08-06 NOTE — H&P (Signed)
History and Physical    Brittany Foster EAV:[id number] DOB: 10/19/1985 DOA: 08/06/2016  PCP: Patient, [name]  Patient coming from: Home.  Chief Complaint: Low back pain and nausea vomiting.  HPI: Brittany CampbellMiranda Foster is a 31 y.o. female with history of sickle cell anemia presents to the ER with complaints of persistent nausea and vomiting with low back pain since this morning. Denies any diarrhea. Denies any fever or chills. Denies any recent travel or any sick contacts. Pain is mostly in the lower abdomen crampy nature.  ED Course: In the ER CT of the abdomen and pelvis done does not show anything acute. Labs revealed hypokalemia. Since patient has persistent pain patient is being admitted for pain control from sickle cell disease and also hydration for nausea vomiting and replacement of potassium.  Review of Systems: As per HPI, rest all negative.   Past Medical History:  Diagnosis Date  . Anemia   . Depression, major, recurrent (HCC)   . Migraines   . Sickle cell anemia (HCC)     Past Surgical History:  Procedure Laterality Date  . CESAREAN SECTION    . CHOLECYSTECTOMY  2000  . IR GENERIC HISTORICAL  10/08/2015   IR US GUIDE VASC ACCESS RIGHT 10/08/2015 Simonne ComeJohn Watts, MD [facility]  . IR GENERIC HISTORICAL  10/08/2015   IR FLUORO GUIDE CV LINE RIGHT 10/08/2015 Simonne ComeJohn Watts, MD [facility]  . MULTIPLE TOOTH EXTRACTIONS N/A   . port a cath placement Right    about 6-7 years ago  . removal of porta cath Right 09/11/15  . TUBAL LIGATION       reports that she has quit smoking. She has never used smokeless tobacco. She reports that she does not drink alcohol or use drugs.  Allergies  Allergen Reactions  . Ultram [Tramadol] Other (See Comments)    seizures  . Zofran [Ondansetron Hcl] Nausea And Vomiting  . Buprenorphine Hcl Hives and Rash    Shaking Tolerates Percocet, Norco, and buprenorphine  . Fentanyl Hives  . Morphine And Related Hives, Rash and Other (See Comments)     Shaking Tolerates Percocet, Norco, Dilaudid, and buprenorphine  . Tape Rash    Family History  Problem Relation Age of Onset  . Sickle cell trait Father   . Sickle cell trait Mother   . Sickle cell anemia Other     Prior to Admission medications   Medication Sig Start Date End Date Taking? Authorizing Provider  ARIPiprazole (ABILIFY) 10 MG tablet Take 10 mg by mouth daily 02/13/16  Yes [provider]  DULoxetine (CYMBALTA) 60 MG capsule TAKE 1 CAPSULE BY MOUTH DAILY. Patient taking differently: TAKE 60 MG BY MOUTH DAILY 11/27/15  Yes Quentin AngstJegede, Olugbemiga E, MD  folic acid (FOLVITE) 1 MG tablet Take 1 tablet (1 mg total) by mouth daily. 01/17/16  Yes Jegede, Olugbemiga E, MD  hydroxyurea (HYDREA) 500 MG capsule TAKE 2 CAPSULES BY MOUTH DAILY. MAY TAKE WITH FOOD TO MINIMIZE GI SIDE EFFECTS. Patient taking differently: Take 500 mg by mouth daily.  05/12/16  Yes Quentin AngstJegede, Olugbemiga E, MD  ibuprofen (ADVIL,MOTRIN) 200 MG tablet Take 400 mg by mouth every 6 (six) hours as needed for moderate pain.   Yes [provider]  L-glutamine (ENDARI) 5 g PACK Powder Packet Take 10 g by mouth 2 (two) times daily. 05/30/16  Yes Quentin AngstJegede, Olugbemiga E, MD  medroxyPROGESTERone (DEPO-PROVERA) 150 MG/ML injection Inject 150 mg into the muscle every 3 (three) months.  Yes [provider]  morphine (MS CONTIN) 30 MG 12 hr tablet Take 1 tablet (30 mg total) by mouth every 12 (twelve) hours. 06/18/16  Yes Quentin Angst, MD  oxyCODONE-acetaminophen (PERCOCET) 10-325 MG tablet Take 1 tablet by mouth every 6 (six) hours as needed for pain. for pain 07/20/16  Yes [provider]  potassium chloride 20 MEQ TBCR Take 20 mEq by mouth daily. 07/09/16  Yes Bing Neighbors, FNP  Topiramate ER 100 MG CP24 Take 100 mg by mouth at bedtime.   Yes [provider]    Physical Exam: Vitals:   08/06/16 1807 08/06/16 1900 08/06/16 1924 08/06/16 2034  BP: 109/75 (!) 140/91  109/70   Pulse: 86 (!) 122  96  Resp: 12 19  15   Temp: 98.8 F (37.1 C)   98.6 F (37 C)  TempSrc: Oral   Oral  SpO2: 95% 97% 96% 99%  Weight:      Height:          Constitutional: Moderately built and nourished. Vitals:   08/06/16 1807 08/06/16 1900 08/06/16 1924 08/06/16 2034  BP: 109/75 (!) 140/91  109/70  Pulse: 86 (!) 122  96  Resp: 12 19  15   Temp: 98.8 F (37.1 C)   98.6 F (37 C)  TempSrc: Oral   Oral  SpO2: 95% 97% 96% 99%  Weight:      Height:       Eyes: Anicteric no pallor. ENMT: No discharge from the ears eyes nose and mouth. Neck: No mass felt. No neck rigidity. Respiratory: No rhonchi or crepitations. Cardiovascular: S1 and S2 heard no murmurs appreciated. Abdomen: Soft nontender bowel sounds present. No guarding or rigidity. Musculoskeletal: No edema. No joint effusion. Skin: No rash. Skin appears warm. Neurologic: Alert awake oriented to time place and person. Moves all extremities. Psychiatric: Appears normal. Normal affect.   Labs on Admission: I have personally reviewed following labs and imaging studies  CBC:  Recent Labs Lab 08/06/16 1644  WBC 16.1*  NEUTROABS 9.5*  HGB 9.0*  HCT 25.3*  MCV 94.8  PLT 395   Basic Metabolic Panel:  Recent Labs Lab 08/06/16 1644  NA 138  K 2.6*  CL 108  CO2 21*  GLUCOSE 105*  BUN 10  CREATININE 0.62  CALCIUM 9.2  MG 1.9   GFR: Estimated Creatinine Clearance: 81.8 mL/min (by C-G formula based on SCr of 0.62 mg/dL). Liver Function Tests:  Recent Labs Lab 08/06/16 1644  AST 47*  ALT 32  ALKPHOS 89  BILITOT 2.8*  PROT 7.8  ALBUMIN 4.4    Recent Labs Lab 08/06/16 1644  LIPASE 31   No results for input(s): AMMONIA in the last 168 hours. Coagulation Profile: No results for input(s): INR, PROTIME in the last 168 hours. Cardiac Enzymes: No results for input(s): CKTOTAL, CKMB, CKMBINDEX, TROPONINI in the last 168 hours. BNP (last 3 results) No results for input(s): PROBNP in the last 8760  hours. HbA1C: No results for input(s): HGBA1C in the last 72 hours. CBG: No results for input(s): GLUCAP in the last 168 hours. Lipid Profile: No results for input(s): CHOL, HDL, LDLCALC, TRIG, CHOLHDL, LDLDIRECT in the last 72 hours. Thyroid Function Tests: No results for input(s): TSH, T4TOTAL, FREET4, T3FREE, THYROIDAB in the last 72 hours. Anemia Panel:  Recent Labs  08/06/16 1644  RETICCTPCT >23.0*   Urine analysis:    Component Value Date/Time   COLORURINE YELLOW 08/06/2016 1810   APPEARANCEUR HAZY (A) 08/06/2016 1810  LABSPEC 1.013 08/06/2016 1810   PHURINE 6.0 08/06/2016 1810   GLUCOSEU NEGATIVE 08/06/2016 1810   HGBUR NEGATIVE 08/06/2016 1810   BILIRUBINUR NEGATIVE 08/06/2016 1810   KETONESUR NEGATIVE 08/06/2016 1810   PROTEINUR NEGATIVE 08/06/2016 1810   UROBILINOGEN 1.0 11/13/2014 1338   NITRITE NEGATIVE 08/06/2016 1810   LEUKOCYTESUR TRACE (A) 08/06/2016 1810   Sepsis Labs: @LABRCNTIP (procalcitonin:4,lacticidven:4) )No results found for this or any previous visit (from the past 240 hour(s)).   Radiological Exams on Admission: Ct Abdomen Pelvis W Contrast  Result Date: 08/06/2016 CLINICAL DATA:  Sickle cell pain crisis. Increased pain in both hips and back. EXAM: CT ABDOMEN AND PELVIS WITH CONTRAST TECHNIQUE: Multidetector CT imaging of the abdomen and pelvis was performed using the standard protocol following bolus administration of intravenous contrast. CONTRAST:  ISOVUE-300 IOPAMIDOL (ISOVUE-300) INJECTION 61% COMPARISON:  CT chest exams dating back to 12/29/2013 FINDINGS: Lower chest: Stable 6.6 mm pleural-based right lower lobe density dating back to 2015 consistent with a benign finding. Borderline cardiomegaly. No cute pneumonic consolidation or effusion. No pneumothorax. Hepatobiliary: No focal liver abnormality is seen. Status post cholecystectomy. No biliary dilatation. Pancreas: Unremarkable. No pancreatic ductal dilatation or surrounding  inflammatory changes. Spleen: Small shrunken spleen with hyperdense appearance consistent with auto infarction. Adrenals/Urinary Tract: Normal bilateral adrenal glands. Symmetric enhancement of both kidneys without focal mass or obstructive uropathy. Possible minimal scarring along the periphery of the left upper pole. Normal appearing bladder. Stomach/Bowel: Stomach is within normal limits. Appendix appears normal. No evidence of bowel wall thickening, distention, or inflammatory changes. Vascular/Lymphatic: No significant vascular findings are present. No enlarged abdominal or pelvic lymph nodes. Reproductive: Uterus and bilateral adnexa are unremarkable. Small follicles are seen within both ovaries. Other: No abdominal wall hernia or abnormality. No abdominopelvic ascites. Musculoskeletal: No acute fracture or malalignment of the dorsal spine. No evidence of avascular necrosis. Mild diffuse sclerotic appearance of the axial and appendicular skeleton likely represent stigmata of the patient's underlying sickle cell disease. IMPRESSION: 1. Diffuse mild sclerotic appearance of the visualized axial and appendicular skeleton consistent with changes of sickle cell disease. No evidence of avascular necrosis nor acute fracture. 2. Chronic autoinfarcted appearance of the spleen. 3. No acute bowel obstruction or inflammation. Electronically Signed   By: Tollie Eth M.D.   On: 08/06/2016 21:39    Assessment/Plan Principal Problem:   Sickle cell pain crisis (HCC) Active Problems:   Nausea & vomiting    1. Sickle cell pain crisis - patient is placed on weight-based Dilaudid PCA. Continue long-acting medication and hydroxyurea. Follow CBC. Check LDH. Continue hydration. Scheduled dose of Toradol. 2. Nausea vomiting - likely gastroenteritis. Continued hydration and closely observe. 3. Hypokalemia probably from vomiting - replace and recheck. Check magnesium.   DVT prophylaxis: Lovenox. Code Status: Full code.   Family Communication: Discussed with patient.  Disposition Plan: Home.  Consults called: None.  Admission status: Inpatient.    Eduard Clos MD [facility] Pager [phone].  If 7PM-7AM, please contact night-coverage [email or web address] Password [email or web address]  08/06/2016, 11:00 PM

## 2016-08-06 NOTE — ED Provider Notes (Signed)
[facility]-EMERGENCY DEPT Provider Note   CSN: [id number] Arrival date & time: 08/06/16  1521     History   Chief Complaint Chief Complaint  Patient presents with  . Sickle Cell Pain Crisis    HPI Brittany Foster is a 31 y.o. female.  HPI  31 year old female with a history of sickle cell presents with recurrent sickle cell pain crisis. Pain is upper abdomen, low back and hips. States this is typical for her. Has had a couple episodes of vomiting as well, usually doesn't have this. No urinary symptoms. No fevers. No chest pain/cough/dyspnea. Tried oral meds without relief. Previous cholecystectomy  Past Medical History:  Diagnosis Date  . Anemia   . Depression, major, recurrent (HCC)   . Migraines   . Sickle cell anemia [facility])     Patient Active Problem List   Diagnosis Date Noted  . Nausea & vomiting 08/06/2016  . Sickle cell crisis (HCC) 05/24/2016  . Sickle cell anemia with pain (HCC) 02/14/2016  . Hypotension 01/26/2016  . Opiate dependence (HCC) 01/23/2016  . MDD (major depressive disorder), recurrent severe, without psychosis (HCC) 01/10/2016  . Vitamin D deficiency 01/08/2015  . Chronic pain syndrome 01/08/2015  . 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 Anemia 02/09/2014  . Neuropathy (HCC) 02/09/2014  . Sickle cell pain crisis (HCC) 12/30/2013    Past Surgical History:  Procedure Laterality Date  . CESAREAN SECTION    . CHOLECYSTECTOMY  2000  . IR GENERIC HISTORICAL  10/08/2015   IR US GUIDE VASC ACCESS RIGHT 10/08/2015 Simonne Come, MD [facility]  . IR GENERIC HISTORICAL  10/08/2015   IR FLUORO GUIDE CV LINE RIGHT 10/08/2015 Simonne Come, MD [facility]  . MULTIPLE TOOTH EXTRACTIONS N/A   . port a cath placement Right    about 6-7 years ago  . removal of porta cath Right 09/11/15  . TUBAL LIGATION      OB History    Gravida Para Term Preterm AB Living   1         1   SAB TAB Ectopic Multiple Live Births           1       Home Medications    Prior  to Admission medications   Medication Sig Start Date End Date Taking? Authorizing Provider  ARIPiprazole (ABILIFY) 10 MG tablet Take 10 mg by mouth daily 02/13/16  Yes [provider]  DULoxetine (CYMBALTA) 60 MG capsule TAKE 1 CAPSULE BY MOUTH DAILY. Patient taking differently: TAKE 60 MG BY MOUTH DAILY 11/27/15  Yes Quentin Angst, MD  folic acid (FOLVITE) 1 MG tablet Take 1 tablet (1 mg total) by mouth daily. 01/17/16  Yes Jegede, Olugbemiga E, MD  hydroxyurea (HYDREA) 500 MG capsule TAKE 2 CAPSULES BY MOUTH DAILY. MAY TAKE WITH FOOD TO MINIMIZE GI SIDE EFFECTS. Patient taking differently: Take 500 mg by mouth daily.  05/12/16  Yes Quentin Angst, MD  ibuprofen (ADVIL,MOTRIN) 200 MG tablet Take 400 mg by mouth every 6 (six) hours as needed for moderate pain.   Yes [provider]  L-glutamine (ENDARI) 5 g PACK Powder Packet Take 10 g by mouth 2 (two) times daily. 05/30/16  Yes Quentin Angst, MD  medroxyPROGESTERone (DEPO-PROVERA) 150 MG/ML injection Inject 150 mg into the muscle every 3 (three) months.    Yes [provider]  morphine (MS CONTIN) 30 MG 12 hr tablet Take 1 tablet (30 mg total) by mouth every 12 (  twelve) hours. 06/18/16  Yes Quentin Angst, MD  oxyCODONE-acetaminophen (PERCOCET) 10-325 MG tablet Take 1 tablet by mouth every 6 (six) hours as needed for pain. for pain 07/20/16  Yes [provider]  potassium chloride 20 MEQ TBCR Take 20 mEq by mouth daily. 07/09/16  Yes Bing Neighbors, FNP  Topiramate ER 100 MG CP24 Take 100 mg by mouth at bedtime.   Yes [provider]    Family History Family History  Problem Relation Age of Onset  . Sickle cell trait Father   . Sickle cell trait Mother   . Sickle cell anemia Other     Social History Social History  Substance Use Topics  . Smoking status: Former Games developer  . Smokeless tobacco: Never Used  . Alcohol use No     Allergies   Ultram [tramadol]; Zofran  [ondansetron hcl]; Buprenorphine hcl; Fentanyl; Morphine and related; and Tape   Review of Systems Review of Systems  Constitutional: Negative for fever.  Respiratory: Negative for shortness of breath.   Cardiovascular: Negative for chest pain.  Gastrointestinal: Positive for abdominal pain, nausea and vomiting. Negative for diarrhea.  Genitourinary: Negative for dysuria.  Musculoskeletal: Positive for arthralgias and back pain.  All other systems reviewed and are negative.    Physical Exam Updated Vital Signs BP 112/70   Pulse 87   Temp 99 F (37.2 C) (Oral)   Resp 15   Ht 5' (1.524 m)   Wt 59.9 kg (132 lb)   SpO2 94%   BMI 25.78 kg/m   Physical Exam  Constitutional: She is oriented to person, place, and time. She appears well-developed and well-nourished.  HENT:  Head: Normocephalic and atraumatic.  Right Ear: External ear normal.  Left Ear: External ear normal.  Nose: Nose normal.  Mouth/Throat: Oropharynx is clear and moist.  Eyes: Right eye exhibits no discharge. Left eye exhibits no discharge.  Cardiovascular: Normal rate, regular rhythm and normal heart sounds.   Pulmonary/Chest: Effort normal and breath sounds normal.  Right chest catheter without redness or signs of infection or tenderness  Abdominal: Soft. There is tenderness (mild) in the epigastric area. There is no CVA tenderness.  Musculoskeletal:       Cervical back: She exhibits no tenderness.       Thoracic back: She exhibits no tenderness.       Lumbar back: She exhibits no tenderness.  Neurological: She is alert and oriented to person, place, and time.  Skin: Skin is warm and dry.  Nursing note and vitals reviewed.    ED Treatments / Results  Labs (all labs ordered are listed, but only abnormal results are displayed) Labs Reviewed  CBC WITH DIFFERENTIAL/PLATELET - Abnormal; Notable for the following:       Result Value   WBC 16.1 (*)    RBC 2.67 (*)    Hemoglobin 9.0 (*)    HCT 25.3 (*)      RDW 21.0 (*)    Neutro Abs 9.5 (*)    Lymphs Abs 5.0 (*)    Monocytes Absolute 1.5 (*)    All other components within normal limits  COMPREHENSIVE METABOLIC PANEL - Abnormal; Notable for the following:    Potassium 2.6 (*)    CO2 21 (*)    Glucose, Bld 105 (*)    AST 47 (*)    Total Bilirubin 2.8 (*)    All other components within normal limits  URINALYSIS, ROUTINE W REFLEX MICROSCOPIC - Abnormal; Notable for the following:  APPearance HAZY (*)    Leukocytes, UA TRACE (*)    Bacteria, UA RARE (*)    Squamous Epithelial / LPF 0-5 (*)    All other components within normal limits  RETICULOCYTES - Abnormal; Notable for the following:    Retic Ct Pct >23.0 (*)    RBC. 2.60 (*)    All other components within normal limits  LIPASE, BLOOD  MAGNESIUM  PREGNANCY, URINE  MAGNESIUM  LACTATE DEHYDROGENASE  BASIC METABOLIC PANEL  POC URINE PREG, ED    EKG  EKG Interpretation None       Radiology Ct Abdomen Pelvis W Contrast  Result Date: 08/06/2016 CLINICAL DATA:  Sickle cell pain crisis. Increased pain in both hips and back. EXAM: CT ABDOMEN AND PELVIS WITH CONTRAST TECHNIQUE: Multidetector CT imaging of the abdomen and pelvis was performed using the standard protocol following bolus administration of intravenous contrast. CONTRAST:  ISOVUE-300 IOPAMIDOL (ISOVUE-300) INJECTION 61% COMPARISON:  CT chest exams dating back to 12/29/2013 FINDINGS: Lower chest: Stable 6.6 mm pleural-based right lower lobe density dating back to 2015 consistent with a benign finding. Borderline cardiomegaly. No cute pneumonic consolidation or effusion. No pneumothorax. Hepatobiliary: No focal liver abnormality is seen. Status post cholecystectomy. No biliary dilatation. Pancreas: Unremarkable. No pancreatic ductal dilatation or surrounding inflammatory changes. Spleen: Small shrunken spleen with hyperdense appearance consistent with auto infarction. Adrenals/Urinary Tract: Normal bilateral adrenal  glands. Symmetric enhancement of both kidneys without focal mass or obstructive uropathy. Possible minimal scarring along the periphery of the left upper pole. Normal appearing bladder. Stomach/Bowel: Stomach is within normal limits. Appendix appears normal. No evidence of bowel wall thickening, distention, or inflammatory changes. Vascular/Lymphatic: No significant vascular findings are present. No enlarged abdominal or pelvic lymph nodes. Reproductive: Uterus and bilateral adnexa are unremarkable. Small follicles are seen within both ovaries. Other: No abdominal wall hernia or abnormality. No abdominopelvic ascites. Musculoskeletal: No acute fracture or malalignment of the dorsal spine. No evidence of avascular necrosis. Mild diffuse sclerotic appearance of the axial and appendicular skeleton likely represent stigmata of the patient's underlying sickle cell disease. IMPRESSION: 1. Diffuse mild sclerotic appearance of the visualized axial and appendicular skeleton consistent with changes of sickle cell disease. No evidence of avascular necrosis nor acute fracture. 2. Chronic autoinfarcted appearance of the spleen. 3. No acute bowel obstruction or inflammation. Electronically Signed   By: Tollie Eth M.D.   On: 08/06/2016 21:39    Procedures Procedures (including critical care time)  Medications Ordered in ED Medications  dextrose 5 %-0.45 % sodium chloride infusion ( Intravenous New Bag/Given 08/06/16 1639)  iopamidol (ISOVUE-300) 61 % injection (not administered)  potassium chloride SA (K-DUR,KLOR-CON) CR tablet 20 mEq (not administered)  morphine (MS CONTIN) 12 hr tablet 30 mg (30 mg Oral Given 08/07/16 0001)  L-glutamine (ENDARI) Powder Packet 10 g (not administered)  hydroxyurea (HYDREA) capsule 500 mg (not administered)  ARIPiprazole (ABILIFY) tablet 10 mg (not administered)  folic acid (FOLVITE) tablet 1 mg (not administered)  DULoxetine (CYMBALTA) DR capsule 60 mg (not administered)  topiramate  (TOPAMAX) tablet 50 mg (50 mg Oral Given 08/07/16 0001)  senna-docusate (Senokot-S) tablet 1 tablet (1 tablet Oral Not Given 08/07/16 0002)  polyethylene glycol (MIRALAX / GLYCOLAX) packet 17 g (not administered)  naloxone (NARCAN) injection 0.4 mg (not administered)    And  sodium chloride flush (NS) 0.9 % injection 9 mL (not administered)  enoxaparin (LOVENOX) injection 40 mg (40 mg Subcutaneous Not Given 08/07/16 0002)  ketorolac (TORADOL) 15 MG/ML  injection 15 mg (15 mg Intravenous Given 08/07/16 0001)  HYDROmorphone (DILAUDID) 1 mg/mL PCA injection (25 mg Intravenous Set-up / Initial Syringe 08/07/16 0002)  ketorolac (TORADOL) 30 MG/ML injection 30 mg (30 mg Intravenous Given 08/06/16 1640)  morphine (MS CONTIN) 12 hr tablet 30 mg (30 mg Oral Given 08/06/16 1653)  promethazine (PHENERGAN) injection 12.5 mg (12.5 mg Intravenous Given 08/06/16 1816)  iopamidol (ISOVUE-300) 61 % injection 100 mL (100 mLs Intravenous Contrast Given 08/06/16 2108)  potassium chloride SA (K-DUR,KLOR-CON) CR tablet 40 mEq (40 mEq Oral Given 08/06/16 2054)  potassium chloride 10 mEq in 100 mL IVPB (10 mEq Intravenous New Bag/Given 08/06/16 2217)  promethazine (PHENERGAN) injection 12.5 mg (12.5 mg Intravenous Given 08/06/16 2028)  diphenhydrAMINE (BENADRYL) injection 25 mg (25 mg Intravenous Given 08/06/16 2008)     Initial Impression / Assessment and Plan / ED Course  I have reviewed the triage vital signs and the nursing notes.  Pertinent labs & imaging results that were available during my care of the patient were reviewed by me and considered in my medical decision making (see chart for details).  Clinical Course as of Aug 07 40  Thu Aug 06, 2016  1633 Care plan reviewed. Will get labs, give fluids and give treatment per the care plan. Abd exam shows no significant tenderness or concern to suggest imaging at this time  [SG]    Clinical Course User Index [SG] Pricilla LovelessGoldston, Kadia Abaya, MD    Patient's abdominal pain is not her  typical and thus with the vomiting and hypokalemia a CT scan was obtained. Unremarkable. Potassium was repleted both orally and IV. Given she has still continued to have intermittent vomiting with the hypokalemia, she will be admitted for further pain control and potassium replacement. Magnesium normal. Hospitalist to admit.  Final Clinical Impressions(s) / ED Diagnoses   Final diagnoses:  Hypokalemia    New Prescriptions Current Discharge Medication List       Pricilla LovelessGoldston, Katori Wirsing, MD 08/07/16 [phone]

## 2016-08-06 NOTE — ED Notes (Signed)
Made request for urine samo;[id number]

## 2016-08-06 NOTE — ED Notes (Signed)
EKG given to EDP,Goldston,.MD., for review. 

## 2016-08-06 NOTE — Discharge Summary (Signed)
Richmond CampbellMiranda Gilvin MRN: [id number] DOB/AGE: 32/03/1985 32 y.o.  Admit date: 07/24/2016 Discharge date: 07/31/2016  Primary Care Physician:  Patient, [name]   Discharge Diagnoses:   Patient Active Problem List   Diagnosis Date Noted  . Sickle cell crisis (HCC) 05/24/2016  . Sickle cell anemia with pain (HCC) 02/14/2016  . Hypotension 01/26/2016  . Opiate dependence (HCC) 01/23/2016  . MDD (major depressive disorder), recurrent severe, without psychosis (HCC) 01/10/2016  . Vitamin D deficiency 01/08/2015  . Chronic pain syndrome 01/08/2015  . 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 Anemia 02/09/2014  . Neuropathy (HCC) 02/09/2014  . Sickle cell pain crisis (HCC) 12/30/2013    DISCHARGE MEDICATION: Allergies as of 07/31/2016      Reactions   Ultram [tramadol] Other (See Comments)   seizures   Zofran [ondansetron Hcl] Nausea And Vomiting   Buprenorphine Hcl Hives, Rash   Shaking Tolerates Percocet, Norco, and buprenorphine   Fentanyl Hives   Morphine And Related Hives, Rash, Other (See Comments)   Shaking Tolerates Percocet, Norco, Dilaudid, and buprenorphine   Tape Rash    Medication List: Not reconciled patient left AGAINST MEDICAL ADVICE  SIGNIFICANT DIAGNOSTIC STUDIES:  No results found.     No results found for this or any previous visit (from the past 240 hour(s)).  BRIEF ADMITTING H & P: Patient is a 31 year old female with history of sickle cell disease but also chronic pain syndrome who has not been getting adequate outpatient care. Patient has been going from one hospital to another for pain management. [facility] and left AGAINST MEDICAL ADVICE. She is here again today with pain at 9 out of 10. Review of her labs show that they're better than when she left AMA from for size. She is describing pain as typical sickle cell pain. Denied any fever or chills no nausea vomiting or diarrhea   Hospital Course:  Present on  Admission: . Sickle cell pain crisis (HCC)  Isla is a very opiate tolerant patient with hemoglobin SS who presented to the hospital with complaints of sickle cell crisis. Although there is nothing about the patient's condition that indicates a sickle cell crisis. Given the nature of this disease process and the fact that his sickle cell crisis may not show any usual laboratory data the patient was given the benefit of the doubt and treated as if she had sickle cell crisis. The patient received 5 days of doses of Dilaudid approach and equivalent of more than 200 mg of oxycodone in addition to her MS Contin. She was also treated with Toradol and IV fluids. The patient reported that she had no improvement in her pain after 5 days and thus her therapy was de-escalating with the weaning of medications. As his usual health [place] as soon as weeding starter opiates she becomes very demanding of IV push opiates and if she does not receive them then she becomes verbally abuses and leaves AGAINST MEDICAL ADVICE. On this admission she was treated to form and she became belligerent because she did not receive IV push Dilaudid and decided to leave AGAINST MEDICAL ADVICE. Please note however that although the patient states that she's not able to walk when she did decide to leave against medical by she was able to ambulate without any difficulty and actually refused wheelchair to take her down stairs and walked downstairs on her own. I'm concerned about [name] currently she has no prescriber for her opiates  as her primary care physician will no longer prescribe opiates for her. She reports that she will be seeing Dr. Terrance Mass at [facility] in [place] within the next month with the expectation that Dr. Terrance Mass will prescribe her opiates.   To be very honest there is great concern amount several medical providers across multiple hospitals that this patient has an addiction and or a tolerance to opiates to the point  where oral medications are not controlling the patient's pain. She was referred to and has started inpatient therapy to deal with her issue of the need for such high doses of pain medications however the patient left AGAINST MEDICAL ADVICE and did not complete the program.  Disposition and Follow-up: Patient left AGAINST MEDICAL ADVICE   DISCHARGE EXAM:  Patient left AGAINST MEDICAL ADVICE and no examination done at the time of her leaving. However the patient was observed ambulating down the hall without any difficulty.  The patient left with a PICC line in place. This is a PICC line that has been in chronically and the patient was admitted with a PICC line in place. The PICC line is managed by her primary provider.  No results for input(s): NA, K, CL, CO2, GLUCOSE, BUN, CREATININE, CALCIUM, MG, PHOS in the last 72 hours. No results for input(s): AST, ALT, ALKPHOS, BILITOT, PROT, ALBUMIN in the last 72 hours. No results for input(s): LIPASE, AMYLASE in the last 72 hours. No results for input(s): WBC, NEUTROABS, HGB, HCT, MCV, PLT in the last 72 hours.   Total time spent including face to face and decision making was less than than 30 minutes  Signed: Fani Rotondo A. 08/06/2016, 1:16 PM

## 2016-08-07 DIAGNOSIS — D5700 Hb-SS disease with crisis, unspecified: Secondary | ICD-10-CM

## 2016-08-07 LAB — BASIC METABOLIC PANEL
ANION GAP: 6 (ref 5–15)
BUN: 11 mg/dL (ref 6–20)
CHLORIDE: 111 mmol/L (ref 101–111)
CO2: 23 mmol/L (ref 22–32)
Calcium: 8.8 mg/dL — ABNORMAL LOW (ref 8.9–10.3)
Creatinine, Ser: 0.55 mg/dL (ref 0.44–1.00)
GFR calc Af Amer: >60 mL/min (ref >60)
Glucose, Bld: 108 mg/dL — ABNORMAL HIGH (ref 65–99)
POTASSIUM: 3.3 mmol/L — AB (ref 3.5–5.1)
SODIUM: 140 mmol/L (ref 135–145)

## 2016-08-07 LAB — MAGNESIUM: Magnesium: 1.9 mg/dL (ref 1.7–2.4)

## 2016-08-07 LAB — LACTATE DEHYDROGENASE: LDH: 244 U/L — ABNORMAL HIGH (ref 98–192)

## 2016-08-07 MED ORDER — METOCLOPRAMIDE HCL 5 MG/ML IJ SOLN
5.0000 mg | INTRAMUSCULAR | Status: AC
  Administered 2016-08-07: 5 mg via INTRAVENOUS
  Filled 2016-08-07: qty 2

## 2016-08-07 MED ORDER — PROMETHAZINE HCL 25 MG/ML IJ SOLN
12.5000 mg | Freq: Four times a day (QID) | INTRAMUSCULAR | Status: DC | PRN
  Administered 2016-08-07 – 2016-08-08 (×3): 12.5 mg via INTRAVENOUS
  Filled 2016-08-07 (×4): qty 1

## 2016-08-07 MED ORDER — PROMETHAZINE HCL 25 MG/ML IJ SOLN
12.5000 mg | Freq: Once | INTRAMUSCULAR | Status: AC
  Administered 2016-08-07: 12.5 mg via INTRAVENOUS
  Filled 2016-08-07: qty 1

## 2016-08-07 MED ORDER — DIPHENHYDRAMINE HCL 25 MG PO CAPS
25.0000 mg | ORAL_CAPSULE | Freq: Four times a day (QID) | ORAL | Status: DC | PRN
  Administered 2016-08-07 – 2016-08-11 (×2): 25 mg via ORAL
  Filled 2016-08-07 (×2): qty 1

## 2016-08-07 MED ORDER — SODIUM CHLORIDE 0.9% FLUSH
10.0000 mL | INTRAVENOUS | Status: DC | PRN
  Administered 2016-08-07: 10 mL
  Filled 2016-08-07: qty 40

## 2016-08-07 NOTE — Progress Notes (Signed)
Dr. Mikeal HawthorneGarba aware via phone pt nauseated with no order for antiemetic. See new orders received in EPIC for phenergan.

## 2016-08-07 NOTE — Progress Notes (Signed)
Subjective: A 31 year old female admitted with reported sickle cell crisis. She has consistently been in the hospital for the last couple weeks moving from one hospital to another with the same problem. She still having pain at 10 out of 10 despite all treatment. Patient has serious chronic pain which is not control as an outpatient adequately. Today her main complaint is nausea and abdominal pain. No diarrhea. She's had one episode of vomiting this morning. No fever or chills.  Objective: Vital signs in last 24 hours: Temp:  [98.5 F (36.9 C)-99 F (37.2 C)] 98.8 F (37.1 C) (06/08 1340) Pulse Rate:  [85-122] 85 (06/08 1340) Resp:  [12-22] 15 (06/08 1340) BP: (93-140)/(59-91) 93/59 (06/08 1340) SpO2:  [94 %-99 %] 98 % (06/08 1340) Weight:  [59.9 kg (132 lb)] 59.9 kg (132 lb) (06/07 1725) Weight change:     Intake/Output from previous day: No intake/output data recorded. Intake/Output this shift: Total I/O In: 120 [P.O.:120] Out: -   General appearance: alert, cooperative and no distress Neck: no adenopathy, no carotid bruit, no JVD, supple, symmetrical, trachea midline and thyroid not enlarged, symmetric, no tenderness/mass/nodules Back: symmetric, no curvature. ROM normal. No CVA tenderness. Resp: clear to auscultation bilaterally Chest wall: no tenderness Extremities: extremities normal, atraumatic, no cyanosis or edema Pulses: 2+ and symmetric Skin: Skin color, texture, turgor normal. No rashes or lesions Neurologic: Grossly normal  Lab Results:  Recent Labs  08/06/16 1644  WBC 16.1*  HGB 9.0*  HCT 25.3*  PLT 395   BMET  Recent Labs  08/06/16 1644 08/07/16 0356  NA 138 140  K 2.6* 3.3*  CL 108 111  CO2 21* 23  GLUCOSE 105* 108*  BUN 10 11  CREATININE 0.62 0.55  CALCIUM 9.2 8.8*    Studies/Results: Ct Abdomen Pelvis W Contrast  Result Date: 08/06/2016 CLINICAL DATA:  Sickle cell pain crisis. Increased pain in both hips and back. EXAM: CT ABDOMEN AND  PELVIS WITH CONTRAST TECHNIQUE: Multidetector CT imaging of the abdomen and pelvis was performed using the standard protocol following bolus administration of intravenous contrast. CONTRAST:  ISOVUE-300 IOPAMIDOL (ISOVUE-300) INJECTION 61% COMPARISON:  CT chest exams dating back to 12/29/2013 FINDINGS: Lower chest: Stable 6.6 mm pleural-based right lower lobe density dating back to 2015 consistent with a benign finding. Borderline cardiomegaly. No cute pneumonic consolidation or effusion. No pneumothorax. Hepatobiliary: No focal liver abnormality is seen. Status post cholecystectomy. No biliary dilatation. Pancreas: Unremarkable. No pancreatic ductal dilatation or surrounding inflammatory changes. Spleen: Small shrunken spleen with hyperdense appearance consistent with auto infarction. Adrenals/Urinary Tract: Normal bilateral adrenal glands. Symmetric enhancement of both kidneys without focal mass or obstructive uropathy. Possible minimal scarring along the periphery of the left upper pole. Normal appearing bladder. Stomach/Bowel: Stomach is within normal limits. Appendix appears normal. No evidence of bowel wall thickening, distention, or inflammatory changes. Vascular/Lymphatic: No significant vascular findings are present. No enlarged abdominal or pelvic lymph nodes. Reproductive: Uterus and bilateral adnexa are unremarkable. Small follicles are seen within both ovaries. Other: No abdominal wall hernia or abnormality. No abdominopelvic ascites. Musculoskeletal: No acute fracture or malalignment of the dorsal spine. No evidence of avascular necrosis. Mild diffuse sclerotic appearance of the axial and appendicular skeleton likely represent stigmata of the patient's underlying sickle cell disease. IMPRESSION: 1. Diffuse mild sclerotic appearance of the visualized axial and appendicular skeleton consistent with changes of sickle cell disease. No evidence of avascular necrosis nor acute fracture. 2. Chronic  autoinfarcted appearance of the spleen. 3. No acute  bowel obstruction or inflammation. Electronically Signed   By: Tollie Ethavid  Kwon M.D.   On: 08/06/2016 21:39    Medications: I have reviewed the patient's current medications.  Assessment/Plan: #1 sickle cell crisis: It is difficult found that patient is a crisis. She has chronic pain which may be driving her hospitalization and pain patient has the following care plan the floor by her primary-care physician and care team:  Inpatient Strategies: . Recommend hydromorphone PCA at a demand dose of 0.5 mg with no provider bolus. IV intermittent dilaudid should not be given. . No IV Benadryl or Phenergan . If no evidence of complicated sickle cell crisis, PCA should be weaned to oral within 48-72 hours . Please don't start patient on HIV medications without confirming that he has an active prescription as an outpatient. Records indicate that he has not seen his ID doctor in over a year. . Do not provide opiate prescriptions at discharge. Assure follow up  We will stick to this care plan.  #2 nausea with abdominal pain: I will keep patient on clear liquid diet only. If and when her abdominal pain resolves, patient will be discharged home after advancing her diet.  #3 chronic pain: As indicated above patient has serious chronic pain issues. We will stick with her care plan while in the hospital.  #4 sickle cell anemia: Patient has not been compliant with her outpatient regimen. Hemoglobin currently is at baseline. She is supposed to be on Hydrea. Check reticulocytes next blood draw.   LOS: 1 day   GARBA,LAWAL 08/07/2016, 2:27 PM

## 2016-08-07 NOTE — Care Management Note (Signed)
Case Management Note  Patient Details  Name: Brittany Foster MRN: [id number] Date of Birth: 12/11/1985  Subjective/Objective:                  Brittany Foster is a 31 y.o. female with history of sickle cell anemia presents to the ER with complaints of persistent nausea and vomiting with low back pain since this morning. Denies any diarrhea. Denies any fever or chills. Denies any recent travel or any sick contacts. Pain is mostly in the lower abdomen crampy nature.  ED Course: In the ER CT of the abdomen and pelvis done does not show anything acute. Labs revealed hypokalemia. Since patient has persistent pain patient is being admitted for pain control from sickle cell disease and also hydration for nausea vomiting and replacement of potassium.   Action/Plan: Date:  August 07, 2016  Chart reviewed for concurrent status and case management needs.  Will continue to follow patient progress.  Discharge Planning: following for needs  Expected discharge date: [id number]  Brittany Foster, BSN, [place]3, [place]   [phone]   Expected Discharge Date:  08/09/16               Expected Discharge Plan:  Home/Self Care  In-House Referral:     Discharge planning Services  CM Consult  Post Acute Care Choice:    Choice offered to:     DME Arranged:    DME Agency:     HH Arranged:    HH Agency:     Status of Service:  In process, will continue to follow  If discussed at Long Length of Stay Meetings, dates discussed:    Additional Comments:  Brittany Foster, Brittany Lorge [name], RN 08/07/2016, 9:07 AM

## 2016-08-07 NOTE — Progress Notes (Signed)
rm [address] Brittany Foster, 31 Sickle Cell. Is itching wants Benadryl IV. Also c/o nausea and no antiemetic ordered.  Thanks.

## 2016-08-08 MED ORDER — PROMETHAZINE HCL 25 MG PO TABS
12.5000 mg | ORAL_TABLET | Freq: Four times a day (QID) | ORAL | Status: DC | PRN
  Administered 2016-08-08 – 2016-08-10 (×8): 12.5 mg via ORAL
  Filled 2016-08-08 (×8): qty 1

## 2016-08-08 NOTE — Progress Notes (Signed)
Subjective: Patient is still complaining of nausea and abdominal pain. She's had no diarrhea but has not tolerated any diet so far. She continues to complain of 8 out of 10 pain and is on Dilaudid PCA and has MS Contin. She is not on any Toradol at the moment. No fever or chills. She has used 52 milligrams with 102 demands and 98 deliveries in the last 24 hours  Objective: Vital signs in last 24 hours: Temp:  [98.3 F (36.8 C)-98.8 F (37.1 C)] 98.3 F (36.8 C) (06/09 0516) Pulse Rate:  [85-90] 90 (06/08 2038) Resp:  [12-18] 16 (06/09 0342) BP: (91-105)/(59-65) 91/62 (06/09 0516) SpO2:  [97 %-100 %] 98 % (06/09 0342) FiO2 (%):  [0 %] 0 % (06/08 1733) Weight change:  Last BM Date: 08/05/16  Intake/Output from previous day: 06/08 0701 - 06/09 0700 In: 3513.8 [P.O.:120; I.V.:3393.8] Out: -  Intake/Output this shift: No intake/output data recorded.  General appearance: alert, cooperative and no distress Neck: no adenopathy, no carotid bruit, no JVD, supple, symmetrical, trachea midline and thyroid not enlarged, symmetric, no tenderness/mass/nodules Back: symmetric, no curvature. ROM normal. No CVA tenderness. Resp: clear to auscultation bilaterally Chest wall: no tenderness Extremities: extremities normal, atraumatic, no cyanosis or edema Pulses: 2+ and symmetric Skin: Skin color, texture, turgor normal. No rashes or lesions Neurologic: Grossly normal  Lab Results:  Recent Labs  08/06/16 1644  WBC 16.1*  HGB 9.0*  HCT 25.3*  PLT 395   BMET  Recent Labs  08/06/16 1644 08/07/16 0356  NA 138 140  K 2.6* 3.3*  CL 108 111  CO2 21* 23  GLUCOSE 105* 108*  BUN 10 11  CREATININE 0.62 0.55  CALCIUM 9.2 8.8*    Studies/Results: Ct Abdomen Pelvis W Contrast  Result Date: 08/06/2016 CLINICAL DATA:  Sickle cell pain crisis. Increased pain in both hips and back. EXAM: CT ABDOMEN AND PELVIS WITH CONTRAST TECHNIQUE: Multidetector CT imaging of the abdomen and pelvis was  performed using the standard protocol following bolus administration of intravenous contrast. CONTRAST:  100mL ISOVUE-300 IOPAMIDOL (ISOVUE-300) INJECTION 61% COMPARISON:  CT chest exams dating back to 12/29/2013 FINDINGS: Lower chest: Stable 6.6 mm pleural-based right lower lobe density dating back to 2015 consistent with a benign finding. Borderline cardiomegaly. No cute pneumonic consolidation or effusion. No pneumothorax. Hepatobiliary: No focal liver abnormality is seen. Status post cholecystectomy. No biliary dilatation. Pancreas: Unremarkable. No pancreatic ductal dilatation or surrounding inflammatory changes. Spleen: Small shrunken spleen with hyperdense appearance consistent with auto infarction. Adrenals/Urinary Tract: Normal bilateral adrenal glands. Symmetric enhancement of both kidneys without focal mass or obstructive uropathy. Possible minimal scarring along the periphery of the left upper pole. Normal appearing bladder. Stomach/Bowel: Stomach is within normal limits. Appendix appears normal. No evidence of bowel wall thickening, distention, or inflammatory changes. Vascular/Lymphatic: No significant vascular findings are present. No enlarged abdominal or pelvic lymph nodes. Reproductive: Uterus and bilateral adnexa are unremarkable. Small follicles are seen within both ovaries. Other: No abdominal wall hernia or abnormality. No abdominopelvic ascites. Musculoskeletal: No acute fracture or malalignment of the dorsal spine. No evidence of avascular necrosis. Mild diffuse sclerotic appearance of the axial and appendicular skeleton likely represent stigmata of the patient's underlying sickle cell disease. IMPRESSION: 1. Diffuse mild sclerotic appearance of the visualized axial and appendicular skeleton consistent with changes of sickle cell disease. No evidence of avascular necrosis nor acute fracture. 2. Chronic autoinfarcted appearance of the spleen. 3. No acute bowel obstruction or inflammation.  Electronically Signed  By: Tollie Eth M.D.   On: 08/06/2016 21:39    Medications: I have reviewed the patient's current medications.  Assessment/Plan: #1 sickle cell crisis: Patient will be maintain on current regimen. If her oral intake continues we will restart other home medications. In the moment continue her care plan as below:  Inpatient Strategies: . Recommend hydromorphone PCA at a demand dose of 0.5 mg with no provider bolus. IV intermittent dilaudid should not be given. . No IV Benadryl or Phenergan . If no evidence of complicated sickle cell crisis, PCA should be weaned to oral within 48-72 hours . Please don't start patient on HIV medications without confirming that he has an active prescription as an outpatient. Records indicate that he has not seen his ID doctor in over a year. . Do not provide opiate prescriptions at discharge. Assure follow up  We will stick to this care plan.  #2 nausea with abdominal pain: Not sure of the actual cause. Continue clear liquid diet and encourage oral intake  #3 chronic pain: Patient has serious chronic pain issues. We will stick with her care plan while in the hospital.  #4 sickle cell anemia: Patient has not been compliant with her outpatient regimen. Hemoglobin currently is at baseline. She is supposed to be on Hydrea. Check reticulocytes next blood draw.   LOS: 2 days   Davine Coba,LAWAL 08/08/2016, 7:58 AM

## 2016-08-09 LAB — CBC WITH DIFFERENTIAL/PLATELET
BASOS ABS: 0 10*3/uL (ref 0.0–0.1)
BASOS PCT: 0 %
Eosinophils Absolute: 0.6 10*3/uL (ref 0.0–0.7)
Eosinophils Relative: 6 %
HEMATOCRIT: 21 % — AB (ref 36.0–46.0)
HEMOGLOBIN: 7.5 g/dL — AB (ref 12.0–15.0)
Lymphocytes Relative: 34 %
Lymphs Abs: 3.6 10*3/uL (ref 0.7–4.0)
MCH: 34.2 pg — ABNORMAL HIGH (ref 26.0–34.0)
MCHC: 35.7 g/dL (ref 30.0–36.0)
MCV: 95.9 fL (ref 78.0–100.0)
Monocytes Absolute: 0.9 10*3/uL (ref 0.1–1.0)
Monocytes Relative: 9 %
NEUTROS ABS: 5.3 10*3/uL (ref 1.7–7.7)
NEUTROS PCT: 51 %
Platelets: 314 10*3/uL (ref 150–400)
RBC: 2.19 MIL/uL — AB (ref 3.87–5.11)
RDW: 19.3 % — ABNORMAL HIGH (ref 11.5–15.5)
WBC: 10.4 10*3/uL (ref 4.0–10.5)

## 2016-08-09 LAB — COMPREHENSIVE METABOLIC PANEL
ALBUMIN: 3.3 g/dL — AB (ref 3.5–5.0)
ALK PHOS: 71 U/L (ref 38–126)
ALT: 23 U/L (ref 14–54)
AST: 34 U/L (ref 15–41)
Anion gap: 4 — ABNORMAL LOW (ref 5–15)
CALCIUM: 8.2 mg/dL — AB (ref 8.9–10.3)
CO2: 21 mmol/L — AB (ref 22–32)
Chloride: 114 mmol/L — ABNORMAL HIGH (ref 101–111)
Creatinine, Ser: 0.46 mg/dL (ref 0.44–1.00)
GFR calc Af Amer: >60 mL/min (ref >60)
GFR calc non Af Amer: >60 mL/min (ref >60)
GLUCOSE: 93 mg/dL (ref 65–99)
Potassium: 3.1 mmol/L — ABNORMAL LOW (ref 3.5–5.1)
SODIUM: 139 mmol/L (ref 135–145)
TOTAL PROTEIN: 6.3 g/dL — AB (ref 6.5–8.1)
Total Bilirubin: 3 mg/dL — ABNORMAL HIGH (ref 0.3–1.2)

## 2016-08-09 NOTE — Progress Notes (Signed)
Subjective: Patient is still complaining of nausea and abdominal pain. She's had no diarrhea but has not tolerated any diet so far. She continues to complain of 8 out of 10 pain and is on Dilaudid PCA and has MS Contin. She is not on any Toradol at the moment. No fever or chills. She has used 52 milligrams with 102 demands and 98 deliveries in the last 24 hours  Objective: Vital signs in last 24 hours: Temp:  [98.5 F (36.9 C)-98.8 F (37.1 C)] 98.5 F (36.9 C) (06/10 1353) Pulse Rate:  [81-91] 91 (06/10 1353) Resp:  [12-22] 15 (06/10 1600) BP: (83-94)/(51-66) 84/52 (06/10 1353) SpO2:  [96 %-100 %] 98 % (06/10 1600) Weight change:  Last BM Date: 08/09/16  Intake/Output from previous day: 06/09 0701 - 06/10 0700 In: 2192.5 [P.O.:480; I.V.:1712.5] Out: -  Intake/Output this shift: No intake/output data recorded.  General appearance: alert, cooperative and no distress Neck: no adenopathy, no carotid bruit, no JVD, supple, symmetrical, trachea midline and thyroid not enlarged, symmetric, no tenderness/mass/nodules Back: symmetric, no curvature. ROM normal. No CVA tenderness. Resp: clear to auscultation bilaterally Chest wall: no tenderness Extremities: extremities normal, atraumatic, no cyanosis or edema Pulses: 2+ and symmetric Skin: Skin color, texture, turgor normal. No rashes or lesions Neurologic: Grossly normal  Lab Results:  Recent Labs  08/09/16 1611  WBC 10.4  HGB 7.5*  HCT 21.0*  PLT 314   BMET  Recent Labs  08/07/16 0356 08/09/16 1611  NA 140 139  K 3.3* 3.1*  CL 111 114*  CO2 23 21*  GLUCOSE 108* 93  BUN 11 <5*  CREATININE 0.55 0.46  CALCIUM 8.8* 8.2*    Studies/Results: No results found.  Medications: I have reviewed the patient's current medications.  Assessment/Plan:  #1 Nausea with abdominal pain: Not sure of the actual cause. I will check abdominal ultrasound. I strongly suspect gastroparesis from narcotics as a cause. Continue clear  liquid diet and encourage oral intake  #2 sickle cell crisis: Patient will be maintained on current regimen. If her oral intake continues we will restart other home medications. We will continue her care plan as below:  Inpatient Strategies: . Recommend hydromorphone PCA at a demand dose of 0.5 mg with no provider bolus. IV intermittent dilaudid should not be given. . No IV Benadryl or Phenergan . If no evidence of complicated sickle cell crisis, PCA should be weaned to oral within 48-72 hours . Please don't start patient on HIV medications without confirming that he has an active prescription as an outpatient. Records indicate that he has not seen his ID doctor in over a year. . Do not provide opiate prescriptions at discharge. Assure follow up  We will stick to this care plan.  #3 chronic pain: Patient has serious chronic pain issues. We will stick with her care plan while in the hospital.  #4 sickle cell anemia: H&H is to be at baseline.  #5 hypokalemia: Most likely from nausea and vomiting. Continue to replete potassium   LOS: 3 days   Dezmin Kittelson,LAWAL 08/09/2016, 7:20 PM

## 2016-08-10 ENCOUNTER — Inpatient Hospital Stay (HOSPITAL_COMMUNITY): Payer: Medicaid Other

## 2016-08-10 DIAGNOSIS — D638 Anemia in other chronic diseases classified elsewhere: Secondary | ICD-10-CM

## 2016-08-10 DIAGNOSIS — F1190 Opioid use, unspecified, uncomplicated: Secondary | ICD-10-CM

## 2016-08-10 DIAGNOSIS — F1124 Opioid dependence with opioid-induced mood disorder: Secondary | ICD-10-CM

## 2016-08-10 DIAGNOSIS — E876 Hypokalemia: Secondary | ICD-10-CM

## 2016-08-10 DIAGNOSIS — G43A0 Cyclical vomiting, not intractable: Secondary | ICD-10-CM

## 2016-08-10 LAB — BASIC METABOLIC PANEL
Anion gap: 4 — ABNORMAL LOW (ref 5–15)
CALCIUM: 8.6 mg/dL — AB (ref 8.9–10.3)
CO2: 20 mmol/L — AB (ref 22–32)
CREATININE: 0.42 mg/dL — AB (ref 0.44–1.00)
Chloride: 116 mmol/L — ABNORMAL HIGH (ref 101–111)
GFR calc non Af Amer: >60 mL/min (ref >60)
Glucose, Bld: 96 mg/dL (ref 65–99)
Potassium: 3.4 mmol/L — ABNORMAL LOW (ref 3.5–5.1)
Sodium: 140 mmol/L (ref 135–145)

## 2016-08-10 LAB — MAGNESIUM: Magnesium: 1.9 mg/dL (ref 1.7–2.4)

## 2016-08-10 MED ORDER — HYDROMORPHONE 1 MG/ML IV SOLN
INTRAVENOUS | Status: DC
  Administered 2016-08-10: 12.4 mg via INTRAVENOUS
  Administered 2016-08-11: 5 mg via INTRAVENOUS
  Administered 2016-08-11: 6 mg via INTRAVENOUS
  Administered 2016-08-11: 5.6 mg via INTRAVENOUS
  Administered 2016-08-11: 1 mg via INTRAVENOUS
  Administered 2016-08-11: 3 mg via INTRAVENOUS
  Administered 2016-08-11: 25 mg via INTRAVENOUS
  Filled 2016-08-10 (×2): qty 25

## 2016-08-10 MED ORDER — PANTOPRAZOLE SODIUM 40 MG IV SOLR
40.0000 mg | Freq: Once | INTRAVENOUS | Status: AC
  Administered 2016-08-10: 40 mg via INTRAVENOUS
  Filled 2016-08-10: qty 40

## 2016-08-10 MED ORDER — PROMETHAZINE HCL 25 MG RE SUPP: 25.0000 mg | Freq: Four times a day (QID) | RECTAL | Status: DC | PRN

## 2016-08-10 MED ORDER — PANTOPRAZOLE SODIUM 40 MG PO TBEC
40.0000 mg | DELAYED_RELEASE_TABLET | Freq: Every day | ORAL | Status: DC
  Administered 2016-08-12: 40 mg via ORAL
  Filled 2016-08-10: qty 1

## 2016-08-10 MED ORDER — DICLOFENAC SODIUM 1 % TD GEL
2.0000 g | Freq: Four times a day (QID) | TRANSDERMAL | Status: DC
  Administered 2016-08-10 – 2016-08-11 (×3): 2 g via TOPICAL
  Filled 2016-08-10: qty 100

## 2016-08-10 MED ORDER — HYDROMORPHONE 1 MG/ML IV SOLN
INTRAVENOUS | Status: DC
  Administered 2016-08-10: 6.9 mg via INTRAVENOUS

## 2016-08-10 MED ORDER — MAGNESIUM OXIDE 400 (241.3 MG) MG PO TABS
400.0000 mg | ORAL_TABLET | Freq: Two times a day (BID) | ORAL | Status: DC
  Administered 2016-08-11 – 2016-08-12 (×2): 400 mg via ORAL
  Filled 2016-08-10 (×5): qty 1

## 2016-08-10 MED ORDER — POTASSIUM CHLORIDE CRYS ER 20 MEQ PO TBCR
20.0000 meq | EXTENDED_RELEASE_TABLET | Freq: Two times a day (BID) | ORAL | Status: DC
  Administered 2016-08-11 – 2016-08-12 (×2): 20 meq via ORAL
  Filled 2016-08-10 (×4): qty 1

## 2016-08-10 MED ORDER — ALUM & MAG HYDROXIDE-SIMETH 200-200-20 MG/5ML PO SUSP: 15.0000 mL | ORAL | Status: DC | PRN

## 2016-08-10 MED ORDER — PROMETHAZINE HCL 25 MG PO TABS
25.0000 mg | ORAL_TABLET | Freq: Four times a day (QID) | ORAL | Status: DC | PRN
Start: 2016-08-10 — End: 2016-08-12
  Administered 2016-08-10 – 2016-08-11 (×3): 25 mg via ORAL
  Filled 2016-08-10 (×4): qty 1

## 2016-08-10 NOTE — Progress Notes (Signed)
Date:  August 10, 2016 Chart reviewed for concurrent status and case management needs. Will continue to follow patient progress. Discharge Planning: following for needs Expected discharge date: [id number] Marcelle SmilingRhonda Davis, BSN, [place]RN3, [place]M   [phone]

## 2016-08-10 NOTE — Progress Notes (Signed)
SICKLE CELL SERVICE PROGRESS NOTE  Brittany Foster ZOX:[id number] DOB: November 30, 1985 DOA: 08/06/2016 PCP: Patient, [name]  Assessment/Plan: Principal Problem:   Sickle cell pain crisis (HCC) Active Problems:   Nausea & vomiting  1. Nausea and Vomiting: It is unclear if patient is still having emesis as she was observed eating but no observation has been made of the emesis or residual contents of the emesis. However in light of the distribution of her c/o pain will obtain UGI. I have a small suspicion that these symptoms are associated with withdrawal however if in fact she is still having emesis then this would negate this theory as she receiving IV Dilaudid.  2. Hypokalemia: Potassium levels almost at normal. I will  replace orally. Magnesium marginal will continue to replace orally also. 3. Hb SS with pain: Will start to wean PCA as patient has been receiving upwards or 800 MME/day. NSAIDs have been held because of the emesis and uncertain GIO process. Will offer Diclofenac topically.  4. Anemia of Chronic disease: Hb at baseline. Continue Hydrea.  5. Opiate Tolerance: Pt is highly tolerant to Opiates as she has no narcoleptic or respiratory effects even with very high doses of Opiates.  6. Depression: Continue Cymbalta and Abilify. 7. Suspected Malingering versus Pseudo-addiction:    Code Status: Full Code Family Communication: N/A Disposition Plan: Not yet ready for discharge  Brittany Foster A.  Pager [phone]. If 7PM-7AM, please contact night-coverage.  08/10/2016, 2:51 PM  LOS: 4 days   Interim History: Pt states that she does not have any MS Contin at home and that she has been out of it since about Jul 19 2016. Pt also states that she has been trying to stretch her pills as she does not currently have a Physician to prescribe her pain medications. She left the hospital AMA on 07/31/2016 as soon as her IV Opiate medications were being weaned and because she has did not receive IV  Phenergan. Pt states that she took Oxycodone ATC after leaving here and until she was seen in the ED at [facility] on 08/04/2016. Upon leaving she took only 2 Oxycodone pills that evening and started feeling nauseas but did not vomit. She then took her Oxycodone ATC on the day of 08/05/2016 and then started vomiting on 08/06/2016 and continued while in the ED. Pt states that she has been having intermittent emesis since admission. However nursing has not seen the contents or amount of the vomitus since being on the ward.   Today patient reports pain as 8/10 and localized to abdomen, hips and legs. She is unable to describe the abdominal pain but states that it is starting in the epigastric area and progressing up into the chest area. She has used 41.21 mg of Dilaudid with 96/70:demands/deliveries in the past 24 hours. I have advised the patient that her IV opiate usage for the last 24 hours in Morphine Equivalents was upwards of 800 mg and that is in addition to her oral analgesics.  Patient  has been receiving these doses of Opiates for the last 5 days without any improvement in her pain. Thus I will start weaning her Opiates as these high doses of opiates are not improving her pain.   Pt also expressed anger at the fact that she has a care plan in place both for ED management and for in-patient management. I tried to explained to patient that management during admission is dictated by her acute medical condition. [name] verbalized that she should  dictate the amount and duration of opiate therapy. I explained that while I would listen to and consider her opinion on these issues, there were other factors that are to be considered in making a decision about her care as it would for any other patient.   Consultants:  None  Procedures:  None  Antibiotics:  None   Objective: Vitals:   08/10/16 0852 08/10/16 1248 08/10/16 1300 08/10/16 1435  BP:   (!) 92/48 (!) 95/53  Pulse:   86   Resp: 16 14 13     Temp:   98.6 F (37 C)   TempSrc:   Oral   SpO2: 100% 99% 100%   Weight:      Height:       Weight change:   Intake/Output Summary (Last 24 hours) at 08/10/16 1451 Last data filed at 08/10/16 1435  Gross per 24 hour  Intake              600 ml  Output                0 ml  Net              600 ml     Physical Exam General: Alert, awake, oriented x3, in no apparent distress.  HEENT: [place]/AT PEERL, EOMI, anicteric Neck: Trachea midline,  no masses, no thyromegal,y no JVD, no carotid bruit OROPHARYNX:  Moist, No exudate/ erythema/lesions.  Heart: Regular rate and rhythm, without murmurs, rubs, gallops, PMI non-displaced, no heaves or thrills on palpation.  Lungs: Clear to auscultation, no wheezing or rhonchi noted. No increased vocal fremitus resonant to percussion  Abdomen: Soft,  Epigastric tenderness present, nondistended, positive bowel sounds, no masses no hepatosplenomegaly noted, no guarding no rebound.  Neuro: No focal neurological deficits noted cranial nerves II through XII grossly intact. Strength at baseline in bilateral upper and lower extremities. Musculoskeletal: No warmth swelling or erythema around joints, no spinal tenderness noted. Pt able to move well in the bed and maintain unusual positions without difficulty or apparent pain. However she reports pain when ambulation attempted.  Psychiatric: Patient alert and oriented x3, she has very poor insight in to her condition as it relates to her opiate use. She also believes that she has the right to demand specific treatments.     Data Reviewed: Basic Metabolic Panel:  Recent Labs Lab 08/06/16 1644 08/07/16 0356 08/09/16 1611 08/10/16 1042  NA 138 140 139 140  K 2.6* 3.3* 3.1* 3.4*  CL 108 111 114* 116*  CO2 21* 23 21* 20*  GLUCOSE 105* 108* 93 96  BUN 10 11 <5* <5*  CREATININE 0.62 0.55 0.46 0.42*  CALCIUM 9.2 8.8* 8.2* 8.6*  MG 1.9 1.9  --  1.9   Liver Function Tests:  Recent Labs Lab 08/06/16 1644  08/09/16 1611  AST 47* 34  ALT 32 23  ALKPHOS 89 71  BILITOT 2.8* 3.0*  PROT 7.8 6.3*  ALBUMIN 4.4 3.3*    Recent Labs Lab 08/06/16 1644  LIPASE 31   No results for input(s): AMMONIA in the last 168 hours. CBC:  Recent Labs Lab 08/06/16 1644 08/09/16 1611  WBC 16.1* 10.4  NEUTROABS 9.5* 5.3  HGB 9.0* 7.5*  HCT 25.3* 21.0*  MCV 94.8 95.9  PLT 395 314   Cardiac Enzymes: No results for input(s): CKTOTAL, CKMB, CKMBINDEX, TROPONINI in the last 168 hours. BNP (last 3 results) No results for input(s): BNP in the last 8760 hours.  ProBNP (last 3  results) No results for input(s): PROBNP in the last 8760 hours.  CBG: No results for input(s): GLUCAP in the last 168 hours.  No results found for this or any previous visit (from the past 240 hour(s)).   Studies: US Abdomen Complete  Result Date: 08/10/2016 CLINICAL DATA:  Three days of abdominal pain. History of previous cholecystectomy, sickle cell disease. EXAM: ABDOMEN ULTRASOUND COMPLETE COMPARISON:  Abdominopelvic CT scan of August 06, 2016 FINDINGS: Gallbladder: The gallbladder is surgically absent. Common bile duct: Diameter: 3.9 mm Liver: No focal lesion identified. Within normal limits in parenchymal echogenicity. IVC: No abnormality visualized. Pancreas: Visualized portion unremarkable. Spleen: The spleen is known to be small in is not well demonstrated on today's study. Right Kidney: Length: 11.2 cm. Echogenicity within normal limits. No mass or hydronephrosis visualized. Left Kidney: Length: 10.2 cm. Echogenicity within normal limits. No mass or hydronephrosis visualized. Abdominal aorta: No aneurysm visualized. Other findings: None. IMPRESSION: No acute intra-abdominal abnormality is observed. Previous cholecystectomy. Known auto infarction of the spleen. Electronically Signed   By: [name]  [place] M.D.   On: 08/10/2016 08:09   Ct Abdomen Pelvis W Contrast  Result Date: 08/06/2016 CLINICAL DATA:  Sickle cell pain crisis.  Increased pain in both hips and back. EXAM: CT ABDOMEN AND PELVIS WITH CONTRAST TECHNIQUE: Multidetector CT imaging of the abdomen and pelvis was performed using the standard protocol following bolus administration of intravenous contrast. CONTRAST:  ISOVUE-300 IOPAMIDOL (ISOVUE-300) INJECTION 61% COMPARISON:  CT chest exams dating back to 12/29/2013 FINDINGS: Lower chest: Stable 6.6 mm pleural-based right lower lobe density dating back to 2015 consistent with a benign finding. Borderline cardiomegaly. No cute pneumonic consolidation or effusion. No pneumothorax. Hepatobiliary: No focal liver abnormality is seen. Status post cholecystectomy. No biliary dilatation. Pancreas: Unremarkable. No pancreatic ductal dilatation or surrounding inflammatory changes. Spleen: Small shrunken spleen with hyperdense appearance consistent with auto infarction. Adrenals/Urinary Tract: Normal bilateral adrenal glands. Symmetric enhancement of both kidneys without focal mass or obstructive uropathy. Possible minimal scarring along the periphery of the left upper pole. Normal appearing bladder. Stomach/Bowel: Stomach is within normal limits. Appendix appears normal. No evidence of bowel wall thickening, distention, or inflammatory changes. Vascular/Lymphatic: No significant vascular findings are present. No enlarged abdominal or pelvic lymph nodes. Reproductive: Uterus and bilateral adnexa are unremarkable. Small follicles are seen within both ovaries. Other: No abdominal wall hernia or abnormality. No abdominopelvic ascites. Musculoskeletal: No acute fracture or malalignment of the dorsal spine. No evidence of avascular necrosis. Mild diffuse sclerotic appearance of the axial and appendicular skeleton likely represent stigmata of the patient's underlying sickle cell disease. IMPRESSION: 1. Diffuse mild sclerotic appearance of the visualized axial and appendicular skeleton consistent with changes of sickle cell disease. No  evidence of avascular necrosis nor acute fracture. 2. Chronic autoinfarcted appearance of the spleen. 3. No acute bowel obstruction or inflammation. Electronically Signed   By: Tollie Eth M.D.   On: 08/06/2016 21:39    Scheduled Meds: . ARIPiprazole  10 mg Oral Daily  . DULoxetine  60 mg Oral Daily  . enoxaparin (LOVENOX) injection  40 mg Subcutaneous QHS  . folic acid  1 mg Oral Daily  . HYDROmorphone   Intravenous Q4H  . hydroxyurea  500 mg Oral Daily  . L-glutamine  10 g Oral BID  . morphine  30 mg Oral Q12H  . potassium chloride SA  20 mEq Oral Daily  . senna-docusate  1 tablet Oral BID  . topiramate  50 mg Oral BID  Continuous Infusions: . dextrose 5 % and 0.45% NaCl 125 mL/hr at 08/10/16 0700    Principal Problem:   Sickle cell pain crisis (HCC) Active Problems:   Nausea & vomiting    In excess of 40 minutes spent during this visit. Greater than 50% involved face to face contact with the patient for assessment, counseling and coordination of care.

## 2016-08-10 NOTE — Progress Notes (Signed)
Pt refusing to use incentive spirometry at this time.  Harvie Morua W Eliana Lueth, RN

## 2016-08-10 NOTE — Progress Notes (Signed)
Pt complaining of nausea and vomiting after lunch tray. RN to not visualize any evidence of vomiting. Pt received PO phenergan this AM.  Maryjean KaAbby W Danyia Borunda, RN

## 2016-08-11 ENCOUNTER — Inpatient Hospital Stay (HOSPITAL_COMMUNITY): Payer: Medicaid Other

## 2016-08-11 DIAGNOSIS — Z79891 Long term (current) use of opiate analgesic: Secondary | ICD-10-CM

## 2016-08-11 DIAGNOSIS — R1013 Epigastric pain: Secondary | ICD-10-CM

## 2016-08-11 DIAGNOSIS — R110 Nausea: Secondary | ICD-10-CM

## 2016-08-11 DIAGNOSIS — E878 Other disorders of electrolyte and fluid balance, not elsewhere classified: Secondary | ICD-10-CM

## 2016-08-11 LAB — COMPREHENSIVE METABOLIC PANEL
ALBUMIN: 3.9 g/dL (ref 3.5–5.0)
ALK PHOS: 91 U/L (ref 38–126)
ALT: 24 U/L (ref 14–54)
AST: 44 U/L — AB (ref 15–41)
Anion gap: 6 (ref 5–15)
BILIRUBIN TOTAL: 2.5 mg/dL — AB (ref 0.3–1.2)
BUN: 6 mg/dL (ref 6–20)
CALCIUM: 8.9 mg/dL (ref 8.9–10.3)
CO2: 21 mmol/L — ABNORMAL LOW (ref 22–32)
CREATININE: 0.53 mg/dL (ref 0.44–1.00)
Chloride: 112 mmol/L — ABNORMAL HIGH (ref 101–111)
GFR calc Af Amer: >60 mL/min (ref >60)
GFR calc non Af Amer: >60 mL/min (ref >60)
GLUCOSE: 92 mg/dL (ref 65–99)
Potassium: 3.3 mmol/L — ABNORMAL LOW (ref 3.5–5.1)
Sodium: 139 mmol/L (ref 135–145)
TOTAL PROTEIN: 7.4 g/dL (ref 6.5–8.1)

## 2016-08-11 MED ORDER — OXYCODONE HCL 5 MG PO TABS
10.0000 mg | ORAL_TABLET | ORAL | Status: DC
  Administered 2016-08-11 – 2016-08-12 (×6): 10 mg via ORAL
  Filled 2016-08-11 (×6): qty 2

## 2016-08-11 MED ORDER — HYDROMORPHONE HCL 1 MG/ML IJ SOLN
0.8000 mg | INTRAMUSCULAR | Status: DC | PRN
  Administered 2016-08-11 – 2016-08-12 (×6): 0.8 mg via INTRAVENOUS
  Filled 2016-08-11 (×6): qty 1

## 2016-08-11 MED ORDER — ALUM & MAG HYDROXIDE-SIMETH 200-200-20 MG/5ML PO SUSP
15.0000 mL | Freq: Three times a day (TID) | ORAL | Status: DC | PRN
  Administered 2016-08-11: 15 mL via ORAL
  Filled 2016-08-11: qty 30

## 2016-08-11 NOTE — Discharge Instructions (Signed)
Duodenitis °Duodenitis is inflammation of the lining of the first part of the small intestine (duodenum). It is commonly caused by a bacterial infection, which may also lead to open sores (ulcers) in the intestine. °Duodenitis may develop suddenly and last for a short time (acute) or it may develop gradually and last for months or years (chronic). °What are the causes? °The most common cause of duodenitis is an infection from H. pylori bacteria. Other causes of this condition include: °· Long-term use of NSAIDs. °· Excessive use of alcohol. °· An infection of the small intestine (giardiasis). °· Crohn’s disease. °· Certain diseases of the immune system. °· Certain treatments for cancer. ° °What increases the risk? ° °The following factors may make you more likely to develop duodenitis: °· Smoking cigarettes. °· Drinking alcohol. °· Having a family history of duodenitis. °· Taking NSAIDs. °· Eating a high-fat diet. ° °What are the signs or symptoms? °Symptoms of this condition may include: °· Gnawing or burning pain in the upper center of the abdomen (epigastric pain). This may get worse when the stomach is empty and may get better after eating. °· Abdominal cramps. °· Nausea and vomiting. °· Bloody vomit. °· Stools that are bloody, dark, or look like tar. °· Diarrhea. °· Weight loss. °· Fatigue. ° °How is this diagnosed? °This condition may be diagnosed based on your medical history and a physical exam. You may also have tests, such as: °· Blood tests. °· Stool tests. °· A test that checks the gases in your breath. °· An X-ray that is done after you swallow a liquid (barium) that makes your digestive tract easier to see. °· Endoscopy. This is an exam of the duodenum that is done by putting a thin tube with a tiny camera on the end down your throat (endoscopy). A sample of tissue from your duodenum (biopsy) may be removed with the endoscope and examined under a microscope for signs of inflammation and  infection. ° °How is this treated? °Treatment depends on the cause of your condition. Treatment may include: °· Antibiotic medicine to treat H. pylori infection. °· Stopping your intake of NSAIDs. °· Medicine to reduce stomach acids. °· Medicine to treat Crohn’s disease. °· Surgery to treat severe inflammation that causes scarring or severe bleeding. ° °Follow these instructions at home: °Medicines °· Take over-the-counter and prescription medicines only as told by your health care provider. °· If you were prescribed antibiotic medicine, take it as told by your health care provider. Do not stop taking the antibiotic even if you start to feel better. °Eating and drinking ° °· Eat small, frequent meals. °· Do not drink alcohol. °· Drink enough water to keep your urine clear or pale yellow. °· Follow instructions from your health care provider about eating or drinking restrictions. You may be asked to avoid: °? Caffeinated drinks. °? Chocolate. °? Peppermint or mint-flavored food or drinks. °? Garlic. °? Onions. °? Spicy foods. °? Citrus fruits. °? Tomato-based foods. °? Fatty foods. °? Fried foods. °Contact a health care provider if: °· You have a fever. °· Your symptoms come back, get worse, or do not get better with treatment. °Get help right away if: °· You vomit blood. °· You have severe abdominal pain. °· Your abdomen swells and is painful. °· You have a lot of blood in your stool. °· You feel dizzy or light-headed. °This information is not intended to replace advice given to you by your health care provider. Make sure you discuss   any questions you have with your health care provider. Document Released: 06/13/2012 Document Revised: 07/25/2015 Document Reviewed: 12/20/2014 Elsevier Interactive Patient Education  2018 [employer] Gastritis, Adult Gastritis is swelling (inflammation) of the stomach. When you have this condition, you can have these problems (symptoms):  Pain in your stomach.  A burning  feeling in your stomach.  Feeling sick to your stomach (nauseous).  Throwing up (vomiting).  Feeling too full after you eat.  It is important to get help for this condition. Without help, your stomach can bleed, and you can get sores (ulcers) in your stomach. Follow these instructions at home:  Take over-the-counter and prescription medicines only as told by your doctor.  If you were prescribed an antibiotic medicine, take it as told by your doctor. Do not stop taking it even if you start to feel better.  Drink enough fluid to keep your pee (urine) clear or pale yellow.  Instead of eating big meals, eat small meals often. Contact a health care provider if:  Your problems get worse.  Your problems go away and then come back. Get help right away if:  You throw up blood or something that looks like coffee grounds.  You have black or dark red poop (stools).  You cannot keep fluids down.  Your stomach pain gets worse.  You have a fever.  You do not feel better after 1 week. This information is not intended to replace advice given to you by your health care provider. Make sure you discuss any questions you have with your health care provider. Document Released: 08/05/2007 Document Revised: 10/16/2015 Document Reviewed: 11/10/2014 Elsevier Interactive Patient Education  [employer]2018 [employer]

## 2016-08-11 NOTE — Progress Notes (Signed)
This shift pt c/o nausea, reminded pt of using emesis bag and calling for RN to  Assess. Also asked if this RN could contact MD for IV Phenergan and or benadryl. Reminded pt of PO orders for these meds. Also stated could not use incentive spirometer because it makes her vomit.

## 2016-08-11 NOTE — Progress Notes (Signed)
PCA dilaudid discontinued. 16 mg wasted with Pernell DupreBrittany Frazier, RN

## 2016-08-11 NOTE — Progress Notes (Signed)
Pt feeling nausea at this time. No vomit currently. Gave prn phenergan.

## 2016-08-11 NOTE — Progress Notes (Addendum)
SICKLE CELL SERVICE PROGRESS NOTE  Brittany Foster ZOX:[id number] DOB: Apr 18, 1985 DOA: 08/06/2016 PCP: Patient, [name]  Assessment/Plan: Principal Problem:   Sickle cell pain crisis (HCC) Active Problems:   Nausea & vomiting  1. Abdominal Pain: Awaiting results of UGI. Pt has had no emesis today. Lipase on admission normal as were LFT's up to 2 days ago. Will recheck CMET today.  2. Hb SS with crisis: Pt is ambulatory without any difficulty and was also able ot perform her own bath today without assistance. I am discontinuing PCA today even against patients request as at this point it is my medical opinion that we are no longer treating acute pain but rather chronic pain and her dependence on opiates.  3. Vomiting: She has had no further emesis since yesterday. Furthermore she was able to drink contrast without any difficulty. UGI complete results pending. Continue Protonix. 4. Anemia of Chronic Disease: Hb stable from 6/10. Will re-check CBC with diff and reticulocytes to evaluate Hydrea effect. She has no clinical evidence of acute anemia.  5. Hypokalemia and borderline Hypomagnesemia: Recheck potassium. Patient refusing Magnesium.  6. Opiate Tolerance: Pt has used the equivalent of 520 mg of Morphine in addition to her MS Contin 60 mg. I am discontinuing the PCA and will observe for any signs of withdrawal.  Code Status: Full Code Family Communication: N/A Disposition Plan: Plan for discharge tomorrow.   [name],[name]  Pager [phone]. If 7PM-7AM, please contact night-coverage.  08/11/2016, 3:53 PM  LOS: 5 days   Interim History: Pt is very uncooperative and rude in her interactions. Iv;e discussed with patient that I am transitioning her to oral analgesics and she got very angry that the PCA would be discontinued. She states that she does not feel well but is unwilling to give any detail to her complaint of not feeling well stating "....that's your job to figure out what's wrong  with me. Don't ask me what';s wrong. "" She also has refused the Protonix and states that "...I don't need any Protonix and I'm not taking it..".  Pt initially refused examination but then reluctantly allowed me to perform the examination. She states that she has pain but is unwilling to give any details. She has used 26 mg of IV Dilaudid on the PCA with 63/56:demands/deliveries in the last 24 hours. Yesterday I advised patient that the PCA would be discontinued after the UGI study today. She is expressing anger that the PCA is being discontinued today and that she is being transitioned to oral analgesics despite explaining the rationale to patient.   Consultants:  None  Procedures:  None  Antibiotics:  None    Objective: Vitals:   08/11/16 0444 08/11/16 0819 08/11/16 1332 08/11/16 1424  BP: (!) 99/52   (!) 100/58  Pulse: 91   94  Resp: 17 12 15 18   Temp: 99.1 F (37.3 C)   99.2 F (37.3 C)  TempSrc: Oral   Oral  SpO2: 99% 100% 100% 95%  Weight:      Height:       Weight change:   Intake/Output Summary (Last 24 hours) at 08/11/16 1553 Last data filed at 08/11/16 1300  Gross per 24 hour  Intake          1249.58 ml  Output                0 ml  Net          1249.58 ml     Physical  Exam General: Alert, awake, oriented x3, in no acute distress.  HEENT: Harris/AT PEERL, EOMI, anicteric Neck: Trachea midline,  no masses, no thyromegal,y no JVD, no carotid bruit OROPHARYNX:  Moist, No exudate/ erythema/lesions.  Heart: Regular rate and rhythm, without murmurs, rubs, gallops, PMI non-displaced, no heaves or thrills on palpation.  Lungs: Clear to auscultation, no wheezing or rhonchi noted. No increased vocal fremitus resonant to percussion  Abdomen: Soft, epigastric tenderness noted, nondistended, positive bowel sounds, no masses no hepatosplenomegaly noted.  Neuro: No focal neurological deficits noted cranial nerves II through XII grossly intact. Strength at functional baseline in  bilateral upper and lower extremities. Musculoskeletal: No warmth swelling or erythema around joints, no spinal tenderness noted. Psychiatric: Patient alert and oriented x3, she has a very angry affect and is uncooperative with interview and examination.    Data Reviewed: Basic Metabolic Panel:  Recent Labs Lab 08/06/16 1644 08/07/16 0356 08/09/16 1611 08/10/16 1042  NA 138 140 139 140  K 2.6* 3.3* 3.1* 3.4*  CL 108 111 114* 116*  CO2 21* 23 21* 20*  GLUCOSE 105* 108* 93 96  BUN 10 11 <5* <5*  CREATININE 0.62 0.55 0.46 0.42*  CALCIUM 9.2 8.8* 8.2* 8.6*  MG 1.9 1.9  --  1.9   Liver Function Tests:  Recent Labs Lab 08/06/16 1644 08/09/16 1611  AST 47* 34  ALT 32 23  ALKPHOS 89 71  BILITOT 2.8* 3.0*  PROT 7.8 6.3*  ALBUMIN 4.4 3.3*    Recent Labs Lab 08/06/16 1644  LIPASE 31   No results for input(s): AMMONIA in the last 168 hours. CBC:  Recent Labs Lab 08/06/16 1644 08/09/16 1611  WBC 16.1* 10.4  NEUTROABS 9.5* 5.3  HGB 9.0* 7.5*  HCT 25.3* 21.0*  MCV 94.8 95.9  PLT 395 314   Cardiac Enzymes: No results for input(s): CKTOTAL, CKMB, CKMBINDEX, TROPONINI in the last 168 hours. BNP (last 3 results) No results for input(s): BNP in the last 8760 hours.  ProBNP (last 3 results) No results for input(s): PROBNP in the last 8760 hours.  CBG: No results for input(s): GLUCAP in the last 168 hours.  No results found for this or any previous visit (from the past 240 hour(s)).   Studies: US Abdomen Complete  Result Date: 08/10/2016 CLINICAL DATA:  Three days of abdominal pain. History of previous cholecystectomy, sickle cell disease. EXAM: ABDOMEN ULTRASOUND COMPLETE COMPARISON:  Abdominopelvic CT scan of August 06, 2016 FINDINGS: Gallbladder: The gallbladder is surgically absent. Common bile duct: Diameter: 3.9 mm Liver: No focal lesion identified. Within normal limits in parenchymal echogenicity. IVC: No abnormality visualized. Pancreas: Visualized portion  unremarkable. Spleen: The spleen is known to be small in is not well demonstrated on today's study. Right Kidney: Length: 11.2 cm. Echogenicity within normal limits. No mass or hydronephrosis visualized. Left Kidney: Length: 10.2 cm. Echogenicity within normal limits. No mass or hydronephrosis visualized. Abdominal aorta: No aneurysm visualized. Other findings: None. IMPRESSION: No acute intra-abdominal abnormality is observed. Previous cholecystectomy. Known auto infarction of the spleen. Electronically Signed   By: [name]  [place] M.D.   On: 08/10/2016 08:09   Ct Abdomen Pelvis W Contrast  Result Date: 08/06/2016 CLINICAL DATA:  Sickle cell pain crisis. Increased pain in both hips and back. EXAM: CT ABDOMEN AND PELVIS WITH CONTRAST TECHNIQUE: Multidetector CT imaging of the abdomen and pelvis was performed using the standard protocol following bolus administration of intravenous contrast. CONTRAST:  ISOVUE-300 IOPAMIDOL (ISOVUE-300) INJECTION 61% COMPARISON:  CT chest  exams dating back to 12/29/2013 FINDINGS: Lower chest: Stable 6.6 mm pleural-based right lower lobe density dating back to 2015 consistent with a benign finding. Borderline cardiomegaly. No cute pneumonic consolidation or effusion. No pneumothorax. Hepatobiliary: No focal liver abnormality is seen. Status post cholecystectomy. No biliary dilatation. Pancreas: Unremarkable. No pancreatic ductal dilatation or surrounding inflammatory changes. Spleen: Small shrunken spleen with hyperdense appearance consistent with auto infarction. Adrenals/Urinary Tract: Normal bilateral adrenal glands. Symmetric enhancement of both kidneys without focal mass or obstructive uropathy. Possible minimal scarring along the periphery of the left upper pole. Normal appearing bladder. Stomach/Bowel: Stomach is within normal limits. Appendix appears normal. No evidence of bowel wall thickening, distention, or inflammatory changes. Vascular/Lymphatic: No significant  vascular findings are present. No enlarged abdominal or pelvic lymph nodes. Reproductive: Uterus and bilateral adnexa are unremarkable. Small follicles are seen within both ovaries. Other: No abdominal wall hernia or abnormality. No abdominopelvic ascites. Musculoskeletal: No acute fracture or malalignment of the dorsal spine. No evidence of avascular necrosis. Mild diffuse sclerotic appearance of the axial and appendicular skeleton likely represent stigmata of the patient's underlying sickle cell disease. IMPRESSION: 1. Diffuse mild sclerotic appearance of the visualized axial and appendicular skeleton consistent with changes of sickle cell disease. No evidence of avascular necrosis nor acute fracture. 2. Chronic autoinfarcted appearance of the spleen. 3. No acute bowel obstruction or inflammation. Electronically Signed   By: Tollie Ethavid  Kwon M.D.   On: 08/06/2016 21:39    Scheduled Meds: . ARIPiprazole  10 mg Oral Daily  . diclofenac sodium  2 g Topical QID  . DULoxetine  60 mg Oral Daily  . enoxaparin (LOVENOX) injection  40 mg Subcutaneous QHS  . folic acid  1 mg Oral Daily  . HYDROmorphone   Intravenous Q4H  . hydroxyurea  500 mg Oral Daily  . L-glutamine  10 g Oral BID  . magnesium oxide  400 mg Oral BID  . morphine  30 mg Oral Q12H  . oxyCODONE  10 mg Oral Q4H  . pantoprazole  40 mg Oral Q0600  . potassium chloride SA  20 mEq Oral BID  . senna-docusate  1 tablet Oral BID  . topiramate  50 mg Oral BID   Continuous Infusions: . dextrose 5 % and 0.45% NaCl 10 mL/hr at 08/10/16 1844    Principal Problem:   Sickle cell pain crisis (HCC) Active Problems:   Nausea & vomiting    In excess of 35 minutes spent during this visit. Greater than 50% involved face to face contact with the patient for assessment, counseling and coordination of care.     ADDENDUM: 4:38 pm  Results of UGI noted with findings consistent with gastritis and duodenitis. Pt notified of results. She admits to  increased NSAID use recently. She is refusing to answer questions regarding family history of PUD. Patient is angry that the PCA is being discontinued and is threatening to leave AMA unless it is continued.   Plan: Continue Protonix, continue GI cocktail and check H. Pylori antibodies. Pt also advised to avoid NSAIDs, ETOH and spicey foods.   Marland Kitchen.  [name],[name]

## 2016-08-11 NOTE — Progress Notes (Signed)
Pt refused  Mag ox, K-dur and protonix this shift. Educated pt on need for medications affecting lab values. Pt presented RN with  "emesis"  In emesis bag, approx 200 mls of pure white liquid with no odor with  Milk consistency with particles, then requested 1x dose of IV Phenergan. RN refused and offered ginger ale, that pt accepted. Will continue to monitor and intervene appropriately.

## 2016-08-11 NOTE — Progress Notes (Signed)
Pt has been very uncooperative while MD was assessing her. Patient has not vomited since last night. States that she doesn't feel well but does not explain how she is feeling.

## 2016-08-12 DIAGNOSIS — Z765 Malingerer [conscious simulation]: Secondary | ICD-10-CM

## 2016-08-12 DIAGNOSIS — K2980 Duodenitis without bleeding: Secondary | ICD-10-CM

## 2016-08-12 DIAGNOSIS — K2900 Acute gastritis without bleeding: Secondary | ICD-10-CM

## 2016-08-12 LAB — H PYLORI, IGM, IGG, IGA AB
H Pylori IgG: <0.8 {index_val} (ref 0.00–0.79)
H. Pylogi, Iga Abs: <9 U (ref 0.0–8.9)
H. Pylogi, Igm Abs: <9 U (ref 0.0–8.9)

## 2016-08-12 LAB — CBC WITH DIFFERENTIAL/PLATELET
Basophils Absolute: 0.1 K/uL (ref 0.0–0.1)
Basophils Relative: 1 %
Eosinophils Absolute: 0.5 K/uL (ref 0.0–0.7)
Eosinophils Relative: 5 %
HCT: 23.6 % — ABNORMAL LOW (ref 36.0–46.0)
Hemoglobin: 8.4 g/dL — ABNORMAL LOW (ref 12.0–15.0)
Lymphocytes Relative: 37 %
Lymphs Abs: 4.1 K/uL — ABNORMAL HIGH (ref 0.7–4.0)
MCH: 33.2 pg (ref 26.0–34.0)
MCHC: 35.6 g/dL (ref 30.0–36.0)
MCV: 93.3 fL (ref 78.0–100.0)
Monocytes Absolute: 1 K/uL (ref 0.1–1.0)
Monocytes Relative: 9 %
Neutro Abs: 5.4 K/uL (ref 1.7–7.7)
Neutrophils Relative %: 48 %
Platelets: 353 K/uL (ref 150–400)
RBC: 2.53 MIL/uL — ABNORMAL LOW (ref 3.87–5.11)
RDW: 21 % — ABNORMAL HIGH (ref 11.5–15.5)
WBC: 11.1 K/uL — ABNORMAL HIGH (ref 4.0–10.5)

## 2016-08-12 LAB — RETICULOCYTES
RBC.: 2.53 MIL/uL — ABNORMAL LOW (ref 3.87–5.11)
RETIC CT PCT: 15.8 % — AB (ref 0.4–3.1)
Retic Count, Absolute: 399.7 10*3/uL — ABNORMAL HIGH (ref 19.0–186.0)

## 2016-08-12 MED ORDER — SUCRALFATE 1 G PO TABS
1.0000 g | ORAL_TABLET | Freq: Three times a day (TID) | ORAL | Status: DC
  Administered 2016-08-12: 1 g via ORAL
  Filled 2016-08-12: qty 1

## 2016-08-12 MED ORDER — OXYCODONE-ACETAMINOPHEN 10-325 MG PO TABS: 1.0000 | ORAL_TABLET | Freq: Four times a day (QID) | ORAL | 0 refills | Status: DC | PRN

## 2016-08-12 MED ORDER — DICLOFENAC SODIUM 1 % TD GEL: 2.0000 g | Freq: Four times a day (QID) | TRANSDERMAL | 0 refills | Status: DC

## 2016-08-12 MED ORDER — HEPARIN SOD (PORK) LOCK FLUSH 100 UNIT/ML IV SOLN
250.0000 [IU] | INTRAVENOUS | Status: AC | PRN
  Administered 2016-08-12: 250 [IU]

## 2016-08-12 MED ORDER — MAGNESIUM OXIDE 400 (241.3 MG) MG PO TABS: 400.0000 mg | ORAL_TABLET | Freq: Two times a day (BID) | ORAL | 1 refills | Status: DC

## 2016-08-12 MED ORDER — SUCRALFATE 1 G PO TABS: 1.0000 g | ORAL_TABLET | Freq: Three times a day (TID) | ORAL | 1 refills | Status: DC

## 2016-08-12 MED ORDER — PANTOPRAZOLE SODIUM 40 MG PO TBEC: 40.0000 mg | DELAYED_RELEASE_TABLET | Freq: Every day | ORAL | 1 refills | Status: DC

## 2016-08-12 MED ORDER — MORPHINE SULFATE ER 30 MG PO TBCR: 30.0000 mg | EXTENDED_RELEASE_TABLET | Freq: Two times a day (BID) | ORAL | 0 refills | Status: DC

## 2016-08-12 NOTE — Progress Notes (Signed)
Notified that patient requested to see leadership to discuss her treatment plan. Upon entering room, patient very upset and angry. Patient stated that she wanted her "care plan removed" because every time that she came to "the emergency room", she was "treated with the plan that was created by Dr. Hyman HopesJegede". I inquired whether she could tell me her treatment plan and what she felt should be different. She replied, "I don't know", I just don't want it in there", "I want it removed". Again, I asked if she could help me to advocate for her by letting me know what should be different. Patient became upset with me at this time and begin to act rudely, stating that I was "just like everyone else" and that she "had to be dying to get what she needed". I explained to her that every patient had an individualized treatment plan and that I could advocate for her if she could tell me what needed to be different for her. Patient then refused to speak to me. I also witnessed patient rudely replying to Dr. Ashley RoyaltyMatthews when the MD was attempting to explain her current treatment plan. I requested that the patient contact me when she wanted to talk more with me.

## 2016-08-12 NOTE — Discharge Summary (Signed)
Brittany Foster MRN: [id number] DOB/AGE: 04/16/1985 31 y.o.  Admit date: 08/06/2016 Discharge date: 08/12/2016  Primary Care Physician:  Patient, [name]   Discharge Diagnoses:   Patient Active Problem List   Diagnosis Date Noted  . Nausea & vomiting 08/06/2016  . Sickle cell crisis (HCC) 05/24/2016  . Sickle cell anemia with pain (HCC) 02/14/2016  . Hypotension 01/26/2016  . Opiate dependence (HCC) 01/23/2016  . MDD (major depressive disorder), recurrent severe, without psychosis (HCC) 01/10/2016  . Vitamin D deficiency 01/08/2015  . Chronic pain syndrome 01/08/2015  . 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 Anemia 02/09/2014  . Neuropathy (HCC) 02/09/2014  . Sickle cell pain crisis (HCC) 12/30/2013    DISCHARGE MEDICATION: Allergies as of 08/12/2016      Reactions   Ultram [tramadol] Other (See Comments)   seizures   Zofran [ondansetron Hcl] Nausea And Vomiting   Buprenorphine Hcl Hives, Rash   Shaking Tolerates Percocet, Norco, and buprenorphine   Fentanyl Hives   Morphine And Related Hives, Rash, Other (See Comments)   Shaking Tolerates Percocet, Norco, Dilaudid, and buprenorphine   Tape Rash      Medication List    STOP taking these medications   ibuprofen 200 MG tablet Commonly known as:  ADVIL,MOTRIN   L-glutamine 5 g Pack Powder Packet Commonly known as:  ENDARI     TAKE these medications   ARIPiprazole 10 MG tablet Commonly known as:  ABILIFY Take 10 mg by mouth daily   diclofenac sodium 1 % Gel Commonly known as:  VOLTAREN Apply 2 g topically 4 (four) times daily.   DULoxetine 60 MG capsule Commonly known as:  CYMBALTA TAKE 1 CAPSULE BY MOUTH DAILY. What changed:  See the new instructions.   folic acid 1 MG tablet Commonly known as:  FOLVITE Take 1 tablet (1 mg total) by mouth daily.   hydroxyurea 500 MG capsule Commonly known as:  HYDREA TAKE 2 CAPSULES BY MOUTH DAILY. MAY TAKE WITH FOOD TO MINIMIZE GI SIDE EFFECTS. What  changed:  how much to take  how to take this  when to take this  additional instructions   magnesium oxide 400 (241.3 Mg) MG tablet Commonly known as:  MAG-OX Take 1 tablet (400 mg total) by mouth 2 (two) times daily.   medroxyPROGESTERone 150 MG/ML injection Commonly known as:  DEPO-PROVERA Inject 150 mg into the muscle every 3 (three) months.   morphine 30 MG 12 hr tablet Commonly known as:  MS CONTIN Take 1 tablet (30 mg total) by mouth every 12 (twelve) hours.   oxyCODONE-acetaminophen 10-325 MG tablet Commonly known as:  PERCOCET Take 1 tablet by mouth every 6 (six) hours as needed for pain. for pain   pantoprazole 40 MG tablet Commonly known as:  PROTONIX Take 1 tablet (40 mg total) by mouth daily at 6 (six) AM. Start taking on:  08/13/2016   Potassium Chloride ER 20 MEQ Tbcr Take 20 mEq by mouth daily.   sucralfate 1 g tablet Commonly known as:  CARAFATE Take 1 tablet (1 g total) by mouth 4 (four) times daily -  with meals and at bedtime.   Topiramate ER 100 MG Cp24 Take 100 mg by mouth at bedtime.         Consults:    SIGNIFICANT DIAGNOSTIC STUDIES:  US Abdomen Complete  Result Date: 08/10/2016 CLINICAL DATA:  Three days of abdominal pain. History of previous cholecystectomy, sickle cell disease. EXAM: ABDOMEN ULTRASOUND COMPLETE COMPARISON:  Abdominopelvic CT  scan of August 06, 2016 FINDINGS: Gallbladder: The gallbladder is surgically absent. Common bile duct: Diameter: 3.9 mm Liver: No focal lesion identified. Within normal limits in parenchymal echogenicity. IVC: No abnormality visualized. Pancreas: Visualized portion unremarkable. Spleen: The spleen is known to be small in is not well demonstrated on today's study. Right Kidney: Length: 11.2 cm. Echogenicity within normal limits. No mass or hydronephrosis visualized. Left Kidney: Length: 10.2 cm. Echogenicity within normal limits. No mass or hydronephrosis visualized. Abdominal aorta: No aneurysm  visualized. Other findings: None. IMPRESSION: No acute intra-abdominal abnormality is observed. Previous cholecystectomy. Known auto infarction of the spleen. Electronically Signed   By: [name]  [place] M.D.   On: 08/10/2016 08:09   Ct Abdomen Pelvis W Contrast  Result Date: 08/06/2016 CLINICAL DATA:  Sickle cell pain crisis. Increased pain in both hips and back. EXAM: CT ABDOMEN AND PELVIS WITH CONTRAST TECHNIQUE: Multidetector CT imaging of the abdomen and pelvis was performed using the standard protocol following bolus administration of intravenous contrast. CONTRAST:  ISOVUE-300 IOPAMIDOL (ISOVUE-300) INJECTION 61% COMPARISON:  CT chest exams dating back to 12/29/2013 FINDINGS: Lower chest: Stable 6.6 mm pleural-based right lower lobe density dating back to 2015 consistent with a benign finding. Borderline cardiomegaly. No cute pneumonic consolidation or effusion. No pneumothorax. Hepatobiliary: No focal liver abnormality is seen. Status post cholecystectomy. No biliary dilatation. Pancreas: Unremarkable. No pancreatic ductal dilatation or surrounding inflammatory changes. Spleen: Small shrunken spleen with hyperdense appearance consistent with auto infarction. Adrenals/Urinary Tract: Normal bilateral adrenal glands. Symmetric enhancement of both kidneys without focal mass or obstructive uropathy. Possible minimal scarring along the periphery of the left upper pole. Normal appearing bladder. Stomach/Bowel: Stomach is within normal limits. Appendix appears normal. No evidence of bowel wall thickening, distention, or inflammatory changes. Vascular/Lymphatic: No significant vascular findings are present. No enlarged abdominal or pelvic lymph nodes. Reproductive: Uterus and bilateral adnexa are unremarkable. Small follicles are seen within both ovaries. Other: No abdominal wall hernia or abnormality. No abdominopelvic ascites. Musculoskeletal: No acute fracture or malalignment of the dorsal spine. No evidence  of avascular necrosis. Mild diffuse sclerotic appearance of the axial and appendicular skeleton likely represent stigmata of the patient's underlying sickle cell disease. IMPRESSION: 1. Diffuse mild sclerotic appearance of the visualized axial and appendicular skeleton consistent with changes of sickle cell disease. No evidence of avascular necrosis nor acute fracture. 2. Chronic autoinfarcted appearance of the spleen. 3. No acute bowel obstruction or inflammation. Electronically Signed   By: Tollie Eth M.D.   On: 08/06/2016 21:39   Dg Ugi W/high Density W/kub  Result Date: 08/11/2016 CLINICAL DATA:  31 year old female with sickle cell anemia presenting with nausea and vomiting over the past 5 days. Subsequent encounter. EXAM: UPPER GI SERIES WITH KUB TECHNIQUE: After obtaining a scout radiograph a routine upper GI series was performed using high density barium. FLUOROSCOPY TIME:  Fluoroscopy Time:  1 minutes and 30 seconds Radiation Exposure Index (if provided by the fluoroscopic device): 24.3 mGy COMPARISON:  08/06/2016 CT FINDINGS: Postcholecystectomy. Moderate stool throughout the colon. Scout view otherwise unremarkable. Normal primary esophageal stripping wave. No esophageal mucosa abnormality noted. No reflux with change of patient position. Significant irritability of the gastric antrum and duodenal bulb without ulceration noted. IMPRESSION: Significant irritability of the gastric antrum and duodenal bulb suggestive of changes of gastritis/duodenitis. No discrete ulcer noted. Electronically Signed   By: Lacy Duverney M.D.   On: 08/11/2016 16:03       No results found for this or any  previous visit (from the past 240 hour(s)).  BRIEF ADMITTING H & P:  Brittany Foster is a 31 y.o. female with history of sickle cell anemia presents to the ER with complaints of persistent nausea and vomiting with low back pain since this morning. Denies any diarrhea. Denies any fever or chills. Denies any recent  travel or any sick contacts. Pain is mostly in the lower abdomen crampy nature.  ED Course: In the ER CT of the abdomen and pelvis done does not show anything acute. Labs revealed hypokalemia. Since patient has persistent pain patient is being admitted for pain control from sickle cell disease and also hydration for nausea vomiting and replacement of potassium.    Hospital Course:  Present on Admission: . Sickle cell pain crisis (HCC) . Nausea & vomiting  1. Acute Gasritis and Duodenitis: Pt had c/o emesis and abdominal pain. CT of abdomen and pelvis as well as KUB were both negative for acute pathology. A lipase was also found to be normal and in th absence of any LFT abnormality a hepatobiliary cause of her pain was less likely. Protonix was ordered and patient initially refused to take it as she did not feel that it was indicated and felt that more Opiates is what she needed.  An UGI showed findings consistent with gastritis and duodenitis. This was likely caused by increased used of Ibuprofen in the absence of Opiate analgesics. Pt finally consented to taking Protonix. Due to the severity of her pain Carafate was also prescribed. I spoke with GI who recommended that she proceed with Medical Therapy for 8 weeks and then follow up as an out patient for further evaluation. Pt has had no further emesis for more than 48 hours. She has also been given instruction on medications and foods that should be avoided. I also discussed with patient that opiates would also contribute to the gastritis by slowing the gut motility thus contributing to further irritation of the gut lining when ingested food or medications. Pt has been advised that she will need to follow up with GI  2. Hypokalemia: On presentation the patient had a potassium level of 2.6 which is likely result of her emesis. It was replaced orally and by IV. 3. Hb SS with crisis: Pt was treated with IV Dilaudid and then transitioned to Oxycodone.  Toradol was held as there was a confirmed suspicion of gastritis and she was given topical Diclofenac instead. At the time of discharge she was ambulatory without any difficulty and saturations was normal. She has no pain medications at home and on light of the contraindication to NSAIDs she will need opiates to manage her pain and tolerance to opiates. Thus a prescription was issued for Oxycodone 10 mg #30 and MS Contin 30 mg #10 tabs.  4. Anemia of chronic disease: Hb remained baseline throughout her hospitalization. 5. Chronic Pain : Pt was continued on MS Contin while hospitalized.  6. Psychosocial/Opiate Dependence: She is psychologically if not physically dependent on Opiates. Her behaviors during this hospitalization were profoundly related to her receipt of Opiates. When she was receiving IV Opiates she was happy and pleasant. However as soon as  the transition to oral analgesics began, she becomes verbally abusive, accusatory, derogatory in her verbalizations and at times threatening.  No matter what was being discussed, she brought it back to her Opiate pain medications and made accusations of Physicians in the ambulatory setting abandoning her care by not writing her prescriptions for Opiate medications. She also  stated that she knew that she had exhausted her options for care in this area and is [facility] but had missed both of her appointments as she did not have transportation. As [previously noted, Dr. Hyman HopesJegede informed me that he continues to be  willing to see the patient for her other medical conditions but could no longer prescribe her opiates. I advised patient to go back to see Dr.Jegede for her medical conditions at least until she is able to obtain another Primary Provider. She retorted that she does not know what she will do.   Disposition and Follow-up: Pt is discharged home in good condition and is uncooperative with giving information on her follow up plans as she has  refused all offers for follow up given her.    DISCHARGE EXAM:  General: Alert, awake, oriented x3, in no apparent distress.  HEENT: [place]/AT PEERL, EOMI, anicteric Neck: Trachea midline, no masses, no thyromegal,y no JVD, no carotid bruit OROPHARYNX: Moist, No exudate/ erythema/lesions.  Heart: Regular rate and rhythm, without murmurs, rubs, gallops or S3. PMI non-displaced. Exam reveals no decreased pulses. Pulmonary/Chest: Normal effort. Breath sounds normal. No. Apnea. Clear to auscultation,no stridor,  no wheezing and no rhonchi noted. No respiratory distress and no tenderness noted. Abdomen: Soft, mild epigastric tenderness. Abdomen is nondistended, normal bowel sounds, no masses no hepatosplenomegaly noted. No fluid wave and no ascites. There is no guarding or rebound. Neuro: Alert and oriented to person, place and time. Normal motor skills, Displays no atrophy or tremors and exhibits normal muscle tone.  No focal neurological deficits noted cranial nerves II through XII grossly intact. No sensory deficit noted.  Strength at baseline in bilateral upper and lower extremities. Gait normal. Musculoskeletal: No warmth swelling or erythema around joints, no spinal tenderness noted. Psychiatric: Patient alert and oriented x3, good cognition, but very poor insight with regard to her problems surrounding opiates and her acute medical issues.  Good recent to remote recall. Skin: Skin is warm and dry. No bruising, no ecchymosis and no rash noted. Pt is not diaphoretic. No erythema. No pallor    Blood pressure 95/64, pulse 95, temperature 98.7 F (37.1 C), temperature source Oral, resp. rate 18, height 5' (1.524 m), weight 59.9 kg (132 lb), SpO2 98 %.   Recent Labs  08/10/16 1042 08/11/16 1630  NA 140 139  K 3.4* 3.3*  CL 116* 112*  CO2 20* 21*  GLUCOSE 96 92  BUN <5* 6  CREATININE 0.42* 0.53  CALCIUM 8.6* 8.9  MG 1.9  --     Recent Labs  08/09/16 1611 08/11/16 1630  AST 34 44*   ALT 23 24  ALKPHOS 71 91  BILITOT 3.0* 2.5*  PROT 6.3* 7.4  ALBUMIN 3.3* 3.9   No results for input(s): LIPASE, AMYLASE in the last 72 hours.  Recent Labs  08/09/16 1611  WBC 10.4  NEUTROABS 5.3  HGB 7.5*  HCT 21.0*  MCV 95.9  PLT 314     Total time spent including face to face and decision making was greater than 30 minutes  Signed: [name],[name] 08/12/2016, 11:03 AM

## 2016-08-12 NOTE — Progress Notes (Signed)
Secretary attempted to make appointment for patient to see Dr. Hyman HopesJegede, but schedulers were at lunch during this time and said they would call the unit back to make appointment once back from lunch. Patient did not want to wait for appointment to be made and ride was waiting. Instructions to follow up with Dr. Hyman HopesJegede was put on AVS. Discharge instructions were gone over with patient and prescriptions given to patient.

## 2016-08-12 NOTE — Progress Notes (Signed)
Pt was informed that IV team needed to come to flush PICC line to be discharged home. RN walked past room patient has already unhooked herself from IV fluids. IV team paged at that time to flush PICC before discharge

## 2016-08-18 ENCOUNTER — Telehealth: Payer: Self-pay | Admitting: Internal Medicine

## 2016-08-18 NOTE — Telephone Encounter (Signed)
[name] does not currently have a PCP and as such no one to follow up on her tests. She had an H.Pylori test done prior to discharge. Results are negative for H.Pylori. I called MS. Brittany PortelaPayne at the listed Phone # but phone went to voice mail without an identifier of name thus no message left in keeping with HIPPA. I also called MS. Oneida ArenasSandra Kincaid her listed emergency contact and spoke with Ms. Ty HiltsKincaid asking her to have [name] at [phone] and ask the operator to page me.   Saman Giddens A.

## 2016-08-21 ENCOUNTER — Emergency Department (HOSPITAL_COMMUNITY)
Admission: EM | Admit: 2016-08-21 | Discharge: 2016-08-22 | Discharge status: Against Medical Advice | Payer: Medicaid Other | Attending: Emergency Medicine | Admitting: Emergency Medicine

## 2016-08-21 ENCOUNTER — Telehealth (HOSPITAL_COMMUNITY): Payer: Self-pay | Admitting: *Deleted

## 2016-08-21 ENCOUNTER — Encounter (HOSPITAL_COMMUNITY): Payer: Self-pay | Admitting: Nurse Practitioner

## 2016-08-21 DIAGNOSIS — D5700 Hb-SS disease with crisis, unspecified: Secondary | ICD-10-CM | POA: Diagnosis present

## 2016-08-21 DIAGNOSIS — Z87891 Personal history of nicotine dependence: Secondary | ICD-10-CM | POA: Insufficient documentation

## 2016-08-21 NOTE — ED Triage Notes (Signed)
Pt is c/o SCC related pain on her back and lower extremities. States he home pain medications did not help.

## 2016-08-21 NOTE — Telephone Encounter (Signed)
[facility]. Denies fever, n/v/d, and abdominal pain. Patient c/o pain in hips, legs, and lower back. Verbalized a 7 out of 10 on the pain scale. Patient stated she last took oxycodone at 0600.   Notified on call NP. Called patient back to notify patient at this time to resume all current home medications, continue hydration, and follow up with [facility] Care Provider. Educated patient to seek medical attention at the ED if CP occurs, SOB, or worsening symptoms. Patient denies further symptoms at this time and verbalized understanding.

## 2016-08-22 ENCOUNTER — Emergency Department (HOSPITAL_COMMUNITY): Payer: Medicaid Other

## 2016-08-22 LAB — CBC WITH DIFFERENTIAL/PLATELET
BASOS ABS: 0 10*3/uL (ref 0.0–0.1)
BASOS PCT: 0 %
EOS ABS: 0.1 10*3/uL (ref 0.0–0.7)
Eosinophils Relative: 1 %
HEMATOCRIT: 32.4 % — AB (ref 36.0–46.0)
HEMOGLOBIN: 11.6 g/dL — AB (ref 12.0–15.0)
Lymphocytes Relative: 28 %
Lymphs Abs: 4 10*3/uL (ref 0.7–4.0)
MCH: 33.4 pg (ref 26.0–34.0)
MCHC: 35.8 g/dL (ref 30.0–36.0)
MCV: 93.4 fL (ref 78.0–100.0)
Monocytes Absolute: 1.2 10*3/uL — ABNORMAL HIGH (ref 0.1–1.0)
Monocytes Relative: 9 %
NEUTROS ABS: 8.8 10*3/uL — AB (ref 1.7–7.7)
NEUTROS PCT: 62 %
Platelets: 252 10*3/uL (ref 150–400)
RBC: 3.47 MIL/uL — ABNORMAL LOW (ref 3.87–5.11)
RDW: 20.1 % — AB (ref 11.5–15.5)
WBC: 14.1 10*3/uL — ABNORMAL HIGH (ref 4.0–10.5)

## 2016-08-22 LAB — BASIC METABOLIC PANEL
ANION GAP: 7 (ref 5–15)
BUN: 16 mg/dL (ref 6–20)
CALCIUM: 9.5 mg/dL (ref 8.9–10.3)
CHLORIDE: 113 mmol/L — AB (ref 101–111)
CO2: 20 mmol/L — AB (ref 22–32)
Creatinine, Ser: 0.54 mg/dL (ref 0.44–1.00)
GFR calc non Af Amer: >60 mL/min (ref >60)
Glucose, Bld: 92 mg/dL (ref 65–99)
Potassium: 3.2 mmol/L — ABNORMAL LOW (ref 3.5–5.1)
SODIUM: 140 mmol/L (ref 135–145)

## 2016-08-22 LAB — RETICULOCYTES
RBC.: 3.47 MIL/uL — AB (ref 3.87–5.11)
RETIC COUNT ABSOLUTE: 163.1 10*3/uL (ref 19.0–186.0)
RETIC CT PCT: 4.7 % — AB (ref 0.4–3.1)

## 2016-08-22 MED ORDER — KETOROLAC TROMETHAMINE 30 MG/ML IJ SOLN
30.0000 mg | Freq: Four times a day (QID) | INTRAMUSCULAR | Status: DC
  Administered 2016-08-22: 30 mg via INTRAVENOUS
  Filled 2016-08-22: qty 1

## 2016-08-22 MED ORDER — PROMETHAZINE HCL 25 MG PO TABS: 25.0000 mg | ORAL_TABLET | ORAL | Status: DC

## 2016-08-22 MED ORDER — DIPHENHYDRAMINE HCL 25 MG PO CAPS
25.0000 mg | ORAL_CAPSULE | Freq: Once | ORAL | Status: AC
  Administered 2016-08-22: 25 mg via ORAL
  Filled 2016-08-22: qty 1

## 2016-08-22 MED ORDER — HYDROMORPHONE HCL 2 MG PO TABS
4.0000 mg | ORAL_TABLET | ORAL | Status: DC | PRN
  Administered 2016-08-22: 4 mg via ORAL
  Filled 2016-08-22: qty 2

## 2016-08-22 MED ORDER — MORPHINE SULFATE ER 30 MG PO TBCR
30.0000 mg | EXTENDED_RELEASE_TABLET | Freq: Once | ORAL | Status: AC
  Administered 2016-08-22: 30 mg via ORAL
  Filled 2016-08-22: qty 2

## 2016-08-22 MED ORDER — DEXTROSE-NACL 5-0.45 % IV SOLN
INTRAVENOUS | Status: DC
  Administered 2016-08-22: via INTRAVENOUS

## 2016-08-22 NOTE — ED Notes (Addendum)
Pt has been told multiple times by provider and writer that per her care plan she would receive her pain medication every 2 hours. Pt was given first dose of medications shortly after being placed in room. Pt was approached by tech who told her the nurse would be coming in to give her the pain medication that has been ordered in a few minutes. Pt became angry and started yelling at staff member and demanded to see nurse. This Clinical research associatewriter and provider explained to pt that she needed to stop yelling and that if she would please calm down, writer would be bringing her pain medication in. When approached by charge nurse, provider, and security pt continued yelling loud enough to be heard across department. Pt yelled at charge nurse and provider telling them "Get the fuck out of my face. I want my damn pain medicine, it's been 2 hours. Nobody has been in here to see me. Y'all aint going to do shit to me. Y'all can't make me leave! As long as I have orders I'm staying here." Pt was told her behavior was unacceptable and that if she wanted to continue her care she would need to stop yelling and cussing at staff. Pt continued behavior and was escorted out by security.

## 2016-08-22 NOTE — ED Provider Notes (Signed)
[facility]-EMERGENCY DEPT Provider Note   CSN: [id number] Arrival date & time: 08/21/16  2240     History   Chief Complaint Chief Complaint  Patient presents with  . Sickle Cell Pain Crisis    HPI Brittany Foster is a 31 y.o. female with a hx of Sickle cell, followed by the sickle cell clinic and recurrent sickle cell pain crisis presents to the Emergency Department complaining of gradual, persistent, progressively worsening pain to the bilateral hips and legs similar to her previous sickle cell pain crises. She also states some pain along the left side of her ribs but denies chest pain, shortness of breath or difficulty breathing. She's had no abdominal pain, nausea or vomiting. She reports taking her oxycodone at home without relief. Patient denies fevers, signs of infection or cough. She has history of cholecystectomy but no other surgeries.  The history is provided by the patient and medical records. No language interpreter was used.    Past Medical History:  Diagnosis Date  . Anemia   . Depression, major, recurrent (HCC)   . Migraines   . Sickle cell anemia [facility])     Patient Active Problem List   Diagnosis Date Noted  . Nausea & vomiting 08/06/2016  . Sickle cell crisis (HCC) 05/24/2016  . Sickle cell anemia with pain (HCC) 02/14/2016  . Hypotension 01/26/2016  . Opiate dependence (HCC) 01/23/2016  . MDD (major depressive disorder), recurrent severe, without psychosis (HCC) 01/10/2016  . Vitamin D deficiency 01/08/2015  . Chronic pain syndrome 01/08/2015  . 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 Anemia 02/09/2014  . Neuropathy (HCC) 02/09/2014  . Sickle cell pain crisis (HCC) 12/30/2013    Past Surgical History:  Procedure Laterality Date  . CESAREAN SECTION    . CHOLECYSTECTOMY  2000  . IR GENERIC HISTORICAL  10/08/2015   IR US GUIDE VASC ACCESS RIGHT 10/08/2015 Simonne Come, MD [facility]  . IR GENERIC HISTORICAL  10/08/2015   IR FLUORO GUIDE CV LINE RIGHT  10/08/2015 Simonne Come, MD [facility]  . MULTIPLE TOOTH EXTRACTIONS N/A   . port a cath placement Right    about 6-7 years ago  . removal of porta cath Right 09/11/15  . TUBAL LIGATION      OB History    Gravida Para Term Preterm AB Living   1         1   SAB TAB Ectopic Multiple Live Births           1       Home Medications    Prior to Admission medications   Medication Sig Start Date End Date Taking? Authorizing Provider  ARIPiprazole (ABILIFY) 10 MG tablet Take 10 mg by mouth daily 02/13/16  Yes [provider]  DULoxetine (CYMBALTA) 60 MG capsule TAKE 1 CAPSULE BY MOUTH DAILY. 11/27/15  Yes Quentin Angst, MD  folic acid (FOLVITE) 1 MG tablet Take 1 tablet (1 mg total) by mouth daily. 01/17/16  Yes Jegede, Olugbemiga E, MD  hydroxyurea (HYDREA) 500 MG capsule TAKE 2 CAPSULES BY MOUTH DAILY. MAY TAKE WITH FOOD TO MINIMIZE GI SIDE EFFECTS. Patient taking differently: Take 500 mg by mouth daily.  05/12/16  Yes Quentin Angst, MD  medroxyPROGESTERone (DEPO-PROVERA) 150 MG/ML injection Inject 150 mg into the muscle every 3 (three) months.    Yes [provider]  oxyCODONE-acetaminophen (PERCOCET) 10-325 MG tablet Take 1 tablet by mouth every 6 (six) hours as needed for pain. for pain Patient  taking differently: Take 1 tablet by mouth every 4 (four) hours as needed for pain. for pain 08/12/16  Yes Altha HarmMatthews, Michelle A, MD  Topiramate ER 100 MG CP24 Take 100 mg by mouth at bedtime.   Yes [provider]    Family History Family History  Problem Relation Age of Onset  . Sickle cell trait Father   . Sickle cell trait Mother   . Sickle cell anemia Other     Social History Social History  Substance Use Topics  . Smoking status: Former Games developermoker  . Smokeless tobacco: Never Used  . Alcohol use No     Allergies   Ultram [tramadol]; Zofran [ondansetron hcl]; Buprenorphine hcl; Fentanyl; Morphine and related; and Tape   Review of  Systems Review of Systems  Constitutional: Negative for chills and fever.  HENT: Negative for congestion.   Eyes: Negative for visual disturbance.  Respiratory: Negative for cough, chest tightness and shortness of breath.   Cardiovascular: Negative for chest pain.  Gastrointestinal: Negative for abdominal pain and vomiting.  Endocrine: Negative for polydipsia, polyphagia and polyuria.  Genitourinary: Negative for pelvic pain and vaginal bleeding.  Musculoskeletal: Positive for arthralgias and myalgias.  Neurological: Negative for seizures and headaches.  Hematological: Negative for adenopathy.  Psychiatric/Behavioral: Negative for confusion.  All other systems reviewed and are negative.    Physical Exam Updated Vital Signs BP 116/80   Pulse 91   Temp 98.7 F (37.1 C) (Oral)   Resp 17   Wt 59.9 kg (132 lb)   SpO2 100%   BMI 25.78 kg/m   Physical Exam  Constitutional: She is oriented to person, place, and time. She appears well-developed and well-nourished. No distress.  Awake, alert, nontoxic appearance  HENT:  Head: Normocephalic and atraumatic.  Mouth/Throat: Oropharynx is clear and moist. No oropharyngeal exudate.  Eyes: Conjunctivae are normal. No scleral icterus.  Neck: Normal range of motion. Neck supple.  Cardiovascular: Regular rhythm and intact distal pulses.  Tachycardia present.   Pulses:      Radial pulses are 2+ on the right side, and 2+ on the left side.       Dorsalis pedis pulses are 2+ on the right side, and 2+ on the left side.       Posterior tibial pulses are 2+ on the right side, and 2+ on the left side.  Capillary refill < 3 sec  Pulmonary/Chest: Effort normal and breath sounds normal. No accessory muscle usage. No tachypnea. No respiratory distress. She has no decreased breath sounds. She has no wheezes. She has no rhonchi. She has no rales. She exhibits no bony tenderness.  Equal chest expansion  Abdominal: Soft. Bowel sounds are normal. She  exhibits no distension and no mass. There is no tenderness. There is no rebound, no guarding and no CVA tenderness.  Musculoskeletal: Normal range of motion. She exhibits no edema or tenderness.  Full ROM of all major joints No erythema, tenderness or swelling of any major joint  Lymphadenopathy:    She has no cervical adenopathy.  Neurological: She is alert and oriented to person, place, and time.  Speech is clear and goal oriented Moves extremities without ataxia  Skin: Skin is warm and dry. She is not diaphoretic. No erythema.  Psychiatric: She has a normal mood and affect. Her behavior is normal.  Nursing note and vitals reviewed.    ED Treatments / Results  Labs (all labs ordered are listed, but only abnormal results are displayed) Labs Reviewed  RETICULOCYTES -  Abnormal; Notable for the following:       Result Value   Retic Ct Pct 4.7 (*)    RBC. 3.47 (*)    All other components within normal limits  CBC WITH DIFFERENTIAL/PLATELET - Abnormal; Notable for the following:    WBC 14.1 (*)    RBC 3.47 (*)    Hemoglobin 11.6 (*)    HCT 32.4 (*)    RDW 20.1 (*)    Neutro Abs 8.8 (*)    Monocytes Absolute 1.2 (*)    All other components within normal limits  BASIC METABOLIC PANEL - Abnormal; Notable for the following:    Potassium 3.2 (*)    Chloride 113 (*)    CO2 20 (*)    All other components within normal limits    Radiology Dg Chest 2 View  Result Date: 08/22/2016 CLINICAL DATA:  Acute onset of upper back and bilateral rib pain. Current history of sickle cell anemia. Initial encounter. EXAM: CHEST  2 VIEW COMPARISON:  Chest radiograph performed 03/13/2016 FINDINGS: The lungs are well-aerated and clear. There is no evidence of focal opacification, pleural effusion or pneumothorax. The heart is normal in size; the mediastinal contour is within normal limits. No acute osseous abnormalities are seen. Clips are noted within the right upper quadrant, reflecting prior  cholecystectomy. The patient's right-sided central line is noted ending about the mid to distal SVC. IMPRESSION: No acute cardiopulmonary process seen. Electronically Signed   By: Roanna Raider M.D.   On: 08/22/2016 00:58    Procedures Procedures (including critical care time)  Medications Ordered in ED Medications  dextrose 5 %-0.45 % sodium chloride infusion ( Intravenous New Bag/Given 08/22/16 0027)  ketorolac (TORADOL) 30 MG/ML injection 30 mg (30 mg Intravenous Given 08/22/16 0027)  HYDROmorphone (DILAUDID) tablet 4 mg (4 mg Oral Given 08/22/16 0027)  promethazine (PHENERGAN) tablet 25 mg (not administered)  morphine (MS CONTIN) 12 hr tablet 30 mg (30 mg Oral Given 08/22/16 0027)  diphenhydrAMINE (BENADRYL) capsule 25 mg (25 mg Oral Given 08/22/16 0106)     Initial Impression / Assessment and Plan / ED Course  I have reviewed the triage vital signs and the nursing notes.  Pertinent labs & imaging results that were available during my care of the patient were reviewed by me and considered in my medical decision making (see chart for details).  Clinical Course as of Aug 22 217  Sat Aug 22, 2016  0210 Patient is now screaming at staff. Her care plan has been followed and she has at this time due for her second dose of Dilaudid but continues to screaming at staff.  Mild hyperkalemia at her baseline. Hemoglobin is improved at 11.6. Recheck count has decreased to 4.7, below her normal baseline of approximately 10. Her heart rate has improved. She is alert and oriented and able to answer questions.  [HM]    Clinical Course User Index [HM] Nataya Bastedo, Boyd Kerbs    She presents with sickle cell pain crisis. She has care plan recommending IV Toradol, fluids and by mouth Dilaudid every 2 hours along with MS Contin. She was given this at onset but became agitated when I refused to give her IV Dilaudid, IV Phenergan and IV Benadryl. Her labs are largely at baseline. Her hypokalemia is  baseline. She reports taking potassium at home. Mild leukocytosis is noted, but no signs or symptoms of infection. No evidence of acute chest syndrome on chest x-ray. Of note, her reticulocyte count has decreased from  her normal baseline of approximately 10 however her hemoglobin is 11.6. Doubt aplastic anemia at this time.  Patient's tachycardia has improved. She is without hypoxia or altered mental status.  Patient became angry, yelling and screaming in the room about not receiving IV Dilaudid. I offered her her repeat by mouth dose as it is time and have ordered by mouth Phenergan for her nausea however she refuses this. Due to her continued screaming, patient was escorted out by police department.  Final Clinical Impressions(s) / ED Diagnoses   Final diagnoses:  Sickle cell pain crisis [facility])    New Prescriptions New Prescriptions   No medications on file     Milta Deiters 08/22/16 1610    Derwood Kaplan, MD 08/22/16 [phone]

## 2016-08-25 ENCOUNTER — Non-Acute Institutional Stay (HOSPITAL_COMMUNITY)
Admission: AD | Admit: 2016-08-25 | Discharge: 2016-08-25 | Disposition: A | Discharge status: Home / Self Care | Payer: Medicaid Other | Source: Ambulatory Visit | Attending: Internal Medicine | Admitting: Internal Medicine

## 2016-08-25 ENCOUNTER — Encounter (HOSPITAL_COMMUNITY): Payer: Self-pay | Admitting: *Deleted

## 2016-08-25 ENCOUNTER — Telehealth (HOSPITAL_COMMUNITY): Payer: Self-pay | Admitting: *Deleted

## 2016-08-25 DIAGNOSIS — Z888 Allergy status to other drugs, medicaments and biological substances status: Secondary | ICD-10-CM | POA: Diagnosis not present

## 2016-08-25 DIAGNOSIS — Z79899 Other long term (current) drug therapy: Secondary | ICD-10-CM | POA: Diagnosis not present

## 2016-08-25 DIAGNOSIS — D5700 Hb-SS disease with crisis, unspecified: Secondary | ICD-10-CM

## 2016-08-25 DIAGNOSIS — Z87891 Personal history of nicotine dependence: Secondary | ICD-10-CM | POA: Insufficient documentation

## 2016-08-25 DIAGNOSIS — Z91048 Other nonmedicinal substance allergy status: Secondary | ICD-10-CM | POA: Insufficient documentation

## 2016-08-25 DIAGNOSIS — F339 Major depressive disorder, recurrent, unspecified: Secondary | ICD-10-CM | POA: Diagnosis not present

## 2016-08-25 DIAGNOSIS — Z793 Long term (current) use of hormonal contraceptives: Secondary | ICD-10-CM | POA: Diagnosis not present

## 2016-08-25 DIAGNOSIS — Z885 Allergy status to narcotic agent status: Secondary | ICD-10-CM | POA: Diagnosis not present

## 2016-08-25 DIAGNOSIS — R52 Pain, unspecified: Secondary | ICD-10-CM | POA: Diagnosis present

## 2016-08-25 LAB — COMPREHENSIVE METABOLIC PANEL
ALBUMIN: 4.8 g/dL (ref 3.5–5.0)
ALT: 29 U/L (ref 14–54)
AST: 42 U/L — AB (ref 15–41)
Alkaline Phosphatase: 90 U/L (ref 38–126)
Anion gap: 7 (ref 5–15)
BUN: 11 mg/dL (ref 6–20)
CHLORIDE: 109 mmol/L (ref 101–111)
CO2: 22 mmol/L (ref 22–32)
Calcium: 9.5 mg/dL (ref 8.9–10.3)
Creatinine, Ser: 0.45 mg/dL (ref 0.44–1.00)
GFR calc Af Amer: >60 mL/min (ref >60)
GFR calc non Af Amer: >60 mL/min (ref >60)
GLUCOSE: 99 mg/dL (ref 65–99)
POTASSIUM: 2.9 mmol/L — AB (ref 3.5–5.1)
Sodium: 138 mmol/L (ref 135–145)
Total Bilirubin: 3.4 mg/dL — ABNORMAL HIGH (ref 0.3–1.2)
Total Protein: 8.7 g/dL — ABNORMAL HIGH (ref 6.5–8.1)

## 2016-08-25 LAB — CBC WITH DIFFERENTIAL/PLATELET
BASOS ABS: 0.1 10*3/uL (ref 0.0–0.1)
BASOS PCT: 1 %
Eosinophils Absolute: 0 10*3/uL (ref 0.0–0.7)
Eosinophils Relative: 0 %
HEMATOCRIT: 31.9 % — AB (ref 36.0–46.0)
HEMOGLOBIN: 11.1 g/dL — AB (ref 12.0–15.0)
LYMPHS PCT: 26 %
Lymphs Abs: 2.6 10*3/uL (ref 0.7–4.0)
MCH: 32.5 pg (ref 26.0–34.0)
MCHC: 34.8 g/dL (ref 30.0–36.0)
MCV: 93.3 fL (ref 78.0–100.0)
MONOS PCT: 10 %
Monocytes Absolute: 1 10*3/uL (ref 0.1–1.0)
NEUTROS ABS: 6.4 10*3/uL (ref 1.7–7.7)
NEUTROS PCT: 63 %
Platelets: 321 10*3/uL (ref 150–400)
RBC: 3.42 MIL/uL — ABNORMAL LOW (ref 3.87–5.11)
RDW: 18.3 % — ABNORMAL HIGH (ref 11.5–15.5)
WBC: 10.1 10*3/uL (ref 4.0–10.5)

## 2016-08-25 LAB — RETICULOCYTES
RBC.: 3.42 MIL/uL — AB (ref 3.87–5.11)
RETIC COUNT ABSOLUTE: 133.4 10*3/uL (ref 19.0–186.0)
Retic Ct Pct: 3.9 % — ABNORMAL HIGH (ref 0.4–3.1)

## 2016-08-25 MED ORDER — HEPARIN SOD (PORK) LOCK FLUSH 100 UNIT/ML IV SOLN
250.0000 [IU] | INTRAVENOUS | Status: AC | PRN
  Administered 2016-08-25: 250 [IU]
  Filled 2016-08-25: qty 5

## 2016-08-25 MED ORDER — OXYCODONE-ACETAMINOPHEN 5-325 MG PO TABS
2.0000 | ORAL_TABLET | Freq: Once | ORAL | Status: AC
  Administered 2016-08-25: 2 via ORAL
  Filled 2016-08-25: qty 2

## 2016-08-25 MED ORDER — DEXTROSE-NACL 5-0.45 % IV SOLN
INTRAVENOUS | Status: DC
  Administered 2016-08-25: 11:00:00 via INTRAVENOUS

## 2016-08-25 MED ORDER — DIPHENHYDRAMINE HCL 25 MG PO CAPS
25.0000 mg | ORAL_CAPSULE | Freq: Once | ORAL | Status: AC | PRN
  Administered 2016-08-25: 25 mg via ORAL
  Filled 2016-08-25: qty 1

## 2016-08-25 MED ORDER — SODIUM CHLORIDE 0.9% FLUSH
10.0000 mL | INTRAVENOUS | Status: AC | PRN
  Administered 2016-08-25: 10 mL

## 2016-08-25 MED ORDER — PROMETHAZINE HCL 25 MG PO TABS
25.0000 mg | ORAL_TABLET | Freq: Once | ORAL | Status: AC
  Administered 2016-08-25: 25 mg via ORAL
  Filled 2016-08-25: qty 1

## 2016-08-25 MED ORDER — HYDROMORPHONE HCL 1 MG/ML IJ SOLN
2.0000 mg | Freq: Once | INTRAMUSCULAR | Status: AC
  Administered 2016-08-25: 2 mg via INTRAVENOUS
  Filled 2016-08-25: qty 2

## 2016-08-25 MED ORDER — KETOROLAC TROMETHAMINE 30 MG/ML IJ SOLN
30.0000 mg | Freq: Once | INTRAMUSCULAR | Status: AC
  Administered 2016-08-25: 30 mg via INTRAMUSCULAR
  Filled 2016-08-25: qty 1

## 2016-08-25 MED ORDER — HYDROMORPHONE HCL 1 MG/ML IJ SOLN: 2.0000 mg | Freq: Once | INTRAMUSCULAR | Status: DC

## 2016-08-25 NOTE — Telephone Encounter (Signed)
[facility] c/o stomach, hip and bilateral leg pain. Patient rates pain a 7/10 on pain scale. Patient denies fever, chest pain, N/V/D, abdominal pain. Patient last took her  home medication Oxycodone around 6:30 am with no relief.   Patient placed on a brief hold and provider notified. [facility] per Jerrilyn CairoKim Harris FNP. Patient states and understanding. Patient has to find a ride and states if she doesn't she will give us a call back.

## 2016-08-25 NOTE — Discharge Summary (Signed)
[facility] Discharge Summary   Patient ID: Brittany CampbellMiranda Foster MRN: [id number] DOB/AGE: 32/03/1985 32 y.o.  Admit date: 08/25/2016 Discharge date: 08/25/2016  Primary Care Physician:  Patient, [name]  Admission Diagnoses:  Active Problems:   Sickle cell pain crisis [facility](HCC)  Discharge Medications: Current Outpatient Prescriptions:   .  ARIPiprazole (ABILIFY) 10 MG tablet, Take 10 mg by mouth daily, Disp: , Rfl: 3 .  DULoxetine (CYMBALTA) 60 MG capsule, TAKE 1 CAPSULE BY MOUTH DAILY., Disp: 30 capsule, Rfl: 3 .  folic acid (FOLVITE) 1 MG tablet, Take 1 tablet (1 mg total) by mouth daily., Disp: 90 tablet, Rfl: 3 .  hydroxyurea (HYDREA) 500 MG capsule, TAKE 2 CAPSULES BY MOUTH DAILY. MAY TAKE WITH FOOD TO MINIMIZE GI SIDE EFFECTS. (Patient taking differently: Take 500 mg by mouth daily. ), Disp: 180 capsule, Rfl: 3 .  medroxyPROGESTERone (DEPO-PROVERA) 150 MG/ML injection, Inject 150 mg into the muscle every 3 (three) months. , Disp: , Rfl:  .  oxyCODONE-acetaminophen (PERCOCET) 10-325 MG tablet, Take 1 tablet by mouth every 6 (six) hours as needed for pain. for pain (Patient taking differently: Take 1 tablet by mouth every 4 (four) hours as needed for pain. for pain), Disp: 30 tablet, Rfl: 0 . Topiramate ER 100 MG CP24, Take 100 mg by mouth at bedtime., Disp: , Rfl:   Consults:  N/A  Significant Diagnostic Studies:  Results for orders placed or performed during the hospital encounter of 08/25/16  CBC with Differential/Platelet  Result Value Ref Range   WBC 10.1 4.0 - 10.5 K/uL   RBC 3.42 (L) 3.87 - 5.11 MIL/uL   Hemoglobin 11.1 (L) 12.0 - 15.0 g/dL   HCT 52.831.9 (L) 41.336.0 - 24.446.0 %   MCV 93.3 78.0 - 100.0 fL   MCH 32.5 26.0 - 34.0 pg   MCHC 34.8 30.0 - 36.0 g/dL   RDW 01.018.3 (H) 27.211.5 - 53.615.5 %   Platelets 321 150 - 400 K/uL   Neutrophils Relative % 63 %   Neutro Abs 6.4 1.7 - 7.7 K/uL   Lymphocytes Relative 26 %   Lymphs Abs 2.6 0.7 - 4.0 K/uL   Monocytes Relative 10 %   Monocytes Absolute 1.0 0.1 - 1.0 K/uL   Eosinophils Relative 0 %   Eosinophils Absolute 0.0 0.0 - 0.7 K/uL   Basophils Relative 1 %   Basophils Absolute 0.1 0.0 - 0.1 K/uL  Reticulocytes  Result Value Ref Range   Retic Ct Pct 3.9 (H) 0.4 - 3.1 %   RBC. 3.42 (L) 3.87 - 5.11 MIL/uL   Retic Count, Absolute 133.4 19.0 - 186.0 K/uL  Comprehensive metabolic panel  Result Value Ref Range   Sodium 138 135 - 145 mmol/L   Potassium 2.9 (L) 3.5 - 5.1 mmol/L   Chloride 109 101 - 111 mmol/L   CO2 22 22 - 32 mmol/L   Glucose, Bld 99 65 - 99 mg/dL   BUN 11 6 - 20 mg/dL   Creatinine, Ser 6.440.45 0.44 - 1.00 mg/dL   Calcium 9.5 8.9 - 03.410.3 mg/dL   Total Protein 8.7 (H) 6.5 - 8.1 g/dL   Albumin 4.8 3.5 - 5.0 g/dL   AST 42 (H) 15 - 41 U/L   ALT 29 14 - 54 U/L   Alkaline Phosphatase 90 38 - 126 U/L   Total Bilirubin 3.4 (H) 0.3 - 1.2 mg/dL   GFR calc non Af Amer >60 >60 mL/min   GFR calc Af Amer >60 >60  mL/min   Anion gap 7 5 - 15   [facility] Course: Brittany Foster is a 31 y.o. female with a diagnosis of Sickle Cell Anemia, type Hb-SS, presented today with a complaint of left upper hip pain and abdominal pain at the umbilicus upon awakening this morning. Brittany Foster reported that her current pain intensity is 8/10 and she is without a PCP and therefore has no pain medication. Brittany Foster was admitted to the day infusion center for extended observation and the following was her course of treatment: -D51/2 @ 125 cc/hr for cellular rehydration. -Toradol 30 mg IV for inflammation. -Hydromorphone 6 mg in 3 divided doses  -Percocet 10 mg / 650 mg (home medication) -Lab values are consistent with patients baseline -Follow-up with new PCP within 1 week -The patient was given clear instructions to go to ER or return to medical center if symptoms do not improve, worsen or new problems develop. The patient verbalized understanding.  Physical Exam at Discharge: BP 117/81   Pulse 97   Temp 99.6  F (37.6 C) (Oral)   Resp 20   Ht 5' (1.524 m)   Wt 132 lb (59.9 kg)   SpO2 100%   BMI 25.78 kg/m   General Appearance:    Alert, cooperative, no distress, appears stated age  Head:    Normocephalic, without obvious abnormality, atraumatic  Eyes:    PERRL, Conjunctiva/cornea clear, EOM's intact.  Back:     Symmetric, no curvature, ROM normal, no CVA tenderness  Lungs:     Clear to auscultation bilaterally, respirations unlabored  Chest Wall:    No tenderness or deformity   Heart:    Regular rate and rhythm, S1 and S2 normal, no murmur, rub   or gallop  Abdomen:     Soft, non-tender, bowel sounds active all four quadrants,    no masses, no organomegaly  Extremities:   Extremities normal, atraumatic, no cyanosis or edema  Pulses:   2+ and symmetric all extremities  Skin:   Skin color, texture, turgor normal, no rashes or lesions  Neurologic:   Normal strength, sensation     Disposition at Discharge: 01-Home or Self Care  Discharge Orders: -Continue to hydrate and take prescribed home medications as ordered. -Resume all home medications. -Keep upcoming appointment  -The patient was given clear instructions to go to ER or return to medical center if symptoms do not improve, worsen or new problems develop. The patient verbalized understanding.  Condition at Discharge:   Stable  Time spent on Discharge:  Greater than 30 minutes.  Signed: Joaquin Foster 08/25/2016, 7:50 PM

## 2016-08-25 NOTE — H&P (Signed)
[facility] History and Physical   Date: 08/25/2016  Patient name: Brittany Foster Medical record number: [id number] Date of birth: 12/25/1985 Age: 31 y.o. Gender: female PCP: Patient, [name]  Attending physician: Brittany Foster, Olugbemiga E, MD  Chief Complaint: Generalized Pain   History of Present Illness: Brittany Foster is a 31 y.o. female with a diagnosis of Sickle Cell Anemia, type Hb-SS, presents today with a complaint of left upper hip pain and abdominal pain at the umbilicus upon awakening this morning. Brittany Foster reports that current pain intensity is 8/10 and she is without a PCP and therefore has no pain medication. Brittany Foster has had multiple emergency department visits and admissions at multiple health systems with the most recent [facility] [place] which she was treated for pain and discharged home. Brittany Foster denies headache, fever, shortness of breath, chest pain, dysuria, diarrhea, nausea, vomiting, or diarrhea.   Meds: Prescriptions Prior to Admission  Medication Sig Dispense Refill Last Dose  . ARIPiprazole (ABILIFY) 10 MG tablet Take 10 mg by mouth daily  3 Past Week at Unknown time  . DULoxetine (CYMBALTA) 60 MG capsule TAKE 1 CAPSULE BY MOUTH DAILY. 30 capsule 3 Past Week at Unknown time  . folic acid (FOLVITE) 1 MG tablet Take 1 tablet (1 mg total) by mouth daily. 90 tablet 3 Past Week at Unknown time  . hydroxyurea (HYDREA) 500 MG capsule TAKE 2 CAPSULES BY MOUTH DAILY. MAY TAKE WITH FOOD TO MINIMIZE GI SIDE EFFECTS. (Patient taking differently: Take 500 mg by mouth daily. ) 180 capsule 3 Past Week at Unknown time  . medroxyPROGESTERone (DEPO-PROVERA) 150 MG/ML injection Inject 150 mg into the muscle every 3 (three) months.    06/2016 at 06/2016  . oxyCODONE-acetaminophen (PERCOCET) 10-325 MG tablet Take 1 tablet by mouth every 6 (six) hours as needed for pain. for pain (Patient taking differently: Take 1 tablet by mouth every  4 (four) hours as needed for pain. for pain) 30 tablet 0 08/21/2016 at 2100  . Topiramate ER 100 MG CP24 Take 100 mg by mouth at bedtime.   Past Week at Unknown time    Allergies: Ultram [tramadol]; Zofran [ondansetron hcl]; Buprenorphine hcl; Fentanyl; Morphine and related; and Tape Past Medical History:  Diagnosis Date  . Anemia   . Depression, major, recurrent (HCC)   . Migraines   . Sickle cell anemia (HCC)    Past Surgical History:  Procedure Laterality Date  . CESAREAN SECTION    . CHOLECYSTECTOMY  2000  . IR GENERIC HISTORICAL  10/08/2015   IR US GUIDE VASC ACCESS RIGHT 10/08/2015 Simonne ComeJohn Watts, MD [facility]INTER[facility]  . IR GENERIC HISTORICAL  10/08/2015   IR FLUORO GUIDE CV LINE RIGHT 10/08/2015 Simonne ComeJohn Watts, MD [facility]  . MULTIPLE TOOTH EXTRACTIONS N/A   . port a cath placement Right    about 6-7 years ago  . removal of porta cath Right 09/11/15  . TUBAL LIGATION     Family History  Problem Relation Age of Onset  . Sickle cell trait Father   . Sickle cell trait Mother   . Sickle cell anemia Other    Social History   Social History  . Marital status: Single    Spouse name: N/A  . Number of children: N/A  . Years of education: N/A   Occupational History  . None    Social History Main Topics  . Smoking status: Former Games developermoker  . Smokeless tobacco: Never Used  .  Alcohol use No  . Drug use: No  . Sexual activity: Yes    Partners: Male    Birth control/ protection: Injection     Comment: 1 new partner recently, no concern about STI, uses condoms   Other Topics Concern  . Not on file   Social History Narrative   68 year old daughter   Review of Systems: Constitutional: negative Cardiovascular: negative Gastrointestinal: Tenderness at umbilicus  Musculoskeletal: positive left hip pain  Neurological: negative   Physical Exam: General Appearance:    Alert, cooperative, no distress, appears stated age  Head:    Normocephalic, without obvious abnormality, atraumatic   Eyes:    PERRL, Conjunctiva/cornea clear, EOM's intact.  Back:     Symmetric, no curvature, ROM normal, no CVA tenderness  Lungs:     Clear to auscultation bilaterally, respirations unlabored  Chest Wall:    No tenderness or deformity   Heart:    Regular rate and rhythm, S1 and S2 normal, no murmur, rub   or gallop  Abdomen:     Soft, non-tender, bowel sounds active all four quadrants,    no masses, no organomegaly  Extremities:   Extremities normal, atraumatic, no cyanosis or edema  Pulses:   2+ and symmetric all extremities  Skin:   Skin color, texture, turgor normal, no rashes or lesions  Neurologic:   Normal strength, sensation    Lab results: Pending   Imaging results:  N/A  Assessment & Plan:  Patient will be admitted to the day infusion center for extended observation.  Start IV D5.45 for cellular rehydration at 125/hr.  Start Toradol 30 mg IV every 6 hours for inflammation.  Hydromorphone 2 mg initially, will assess the necessity for continued dosing  Patient will be re-evaluated for pain intensity in the context of function and relationship to baseline as   care progresses.  If no significant pain relief, will transfer patient to inpatient services for a higher level of care.   Will check CBC w/differential, CMP, Reticulocyte    Joaquin Courts 08/25/2016, 10:10 AM

## 2016-08-25 NOTE — Discharge Instructions (Signed)
Schedule a follow-up with your new PCP for pain management.   If you experience any worsening of symptoms report to the ED.    Sickle Cell Anemia, Adult Sickle cell anemia is a condition where your red blood cells are shaped like sickles. Red blood cells carry oxygen through the body. Sickle-shaped red blood cells do not live as long as normal red blood cells. They also clump together and block blood from flowing through the blood vessels. These things prevent the body from getting enough oxygen. Sickle cell anemia causes organ damage and pain. It also increases the risk of infection. Follow these instructions at home:  Drink enough fluid to keep your pee (urine) clear or pale yellow. Drink more in hot weather and during exercise.  Do not smoke. Smoking lowers oxygen levels in the blood.  Only take over-the-counter or prescription medicines as told by your doctor.  Take antibiotic medicines as told by your doctor. Make sure you finish them even if you start to feel better.  Take supplements as told by your doctor.  Consider wearing a medical alert bracelet. This tells anyone caring for you in an emergency of your condition.  When traveling, keep your medical information, doctors' names, and the medicines you take with you at all times.  If you have a fever, do not take fever medicines right away. This could cover up a problem. Tell your doctor.  Keep all follow-up visits with your doctor. Sickle cell anemia requires regular medical care. Contact a doctor if: You have a fever. Get help right away if:  You feel dizzy or faint.  You have new belly (abdominal) pain, especially on the left side near the stomach area.  You have a lasting, often uncomfortable and painful erection of the penis (priapism). If it is not treated right away, you will become unable to have sex (impotence).  You have numbness in your arms or legs or you have a hard time moving them.  You have a hard time  talking.  You have a fever or lasting symptoms for more than 2-3 days.  You have a fever and your symptoms suddenly get worse.  You have signs or symptoms of infection. These include: ? Chills. ? Being more tired than normal (lethargy). ? Irritability. ? Poor eating. ? Throwing up (vomiting).  You have pain that is not helped with medicine.  You have shortness of breath.  You have pain in your chest.  You are coughing up pus-like or bloody mucus.  You have a stiff neck.  Your feet or hands swell or have pain.  Your belly looks bloated.  Your joints hurt. This information is not intended to replace advice given to you by your health care provider. Make sure you discuss any questions you have with your health care provider. Document Released: 12/07/2012 Document Revised: 07/25/2015 Document Reviewed: 09/28/2012 Elsevier Interactive Patient Education  2017 [employer]

## 2016-08-25 NOTE — Progress Notes (Signed)
[facility]. Pt states her pain is 8/10 and 6/10 at discharge. Pt was treated with IV fluids, Dilaudid pushes and Toradol. Pt was alert, oriented and ambulatory at d/c. She received d/c instructions with verbal understanding.

## 2016-08-27 ENCOUNTER — Telehealth: Payer: Self-pay | Admitting: Hematology

## 2016-08-27 NOTE — Telephone Encounter (Signed)
[name] is calling in asking for Brittany CairoKim Harris, NP to write her a prescription for pain medication.  Reminded patient that she was notified back in May that she would no longer receive narcotic prescriptions from our office.  At that time patient advised that she had a hematologist who would be taking care of her sickle cell and pain management. Patient was advised that she could still utilize the day hospital for acute sickle cell crisis.  Patient verbalized understanding at that time.  Patient also completed a medical records request on 07/30/16 to have records sent to Dr. Hyacinth MeekerMiller in [place].  This request was completed. / Patient then stated she wanted Brittany CairoKim Harris, NP to be her primary care physician for "other things", not for sickle cell.  Caller placed on hold and patient access specialist made appointment for patient. /

## 2016-09-04 ENCOUNTER — Telehealth (HOSPITAL_COMMUNITY): Payer: Self-pay | Admitting: *Deleted

## 2016-09-04 ENCOUNTER — Non-Acute Institutional Stay (HOSPITAL_COMMUNITY)
Admission: AD | Admit: 2016-09-04 | Discharge: 2016-09-04 | Disposition: A | Discharge status: Home / Self Care | Payer: Medicaid Other | Source: Ambulatory Visit | Attending: Internal Medicine | Admitting: Internal Medicine

## 2016-09-04 ENCOUNTER — Encounter (HOSPITAL_COMMUNITY): Payer: Self-pay | Admitting: *Deleted

## 2016-09-04 DIAGNOSIS — E876 Hypokalemia: Secondary | ICD-10-CM | POA: Diagnosis not present

## 2016-09-04 DIAGNOSIS — D5700 Hb-SS disease with crisis, unspecified: Secondary | ICD-10-CM | POA: Diagnosis not present

## 2016-09-04 DIAGNOSIS — Z79899 Other long term (current) drug therapy: Secondary | ICD-10-CM | POA: Insufficient documentation

## 2016-09-04 DIAGNOSIS — Z87891 Personal history of nicotine dependence: Secondary | ICD-10-CM | POA: Diagnosis not present

## 2016-09-04 LAB — COMPREHENSIVE METABOLIC PANEL
ALBUMIN: 4 g/dL (ref 3.5–5.0)
ALT: 32 U/L (ref 14–54)
ANION GAP: 5 (ref 5–15)
AST: 38 U/L (ref 15–41)
Alkaline Phosphatase: 79 U/L (ref 38–126)
BILIRUBIN TOTAL: 1.9 mg/dL — AB (ref 0.3–1.2)
BUN: 7 mg/dL (ref 6–20)
CHLORIDE: 114 mmol/L — AB (ref 101–111)
CO2: 20 mmol/L — AB (ref 22–32)
Calcium: 9 mg/dL (ref 8.9–10.3)
Creatinine, Ser: 0.37 mg/dL — ABNORMAL LOW (ref 0.44–1.00)
GFR calc Af Amer: >60 mL/min (ref >60)
GFR calc non Af Amer: >60 mL/min (ref >60)
GLUCOSE: 143 mg/dL — AB (ref 65–99)
POTASSIUM: 3 mmol/L — AB (ref 3.5–5.1)
SODIUM: 139 mmol/L (ref 135–145)
TOTAL PROTEIN: 7.6 g/dL (ref 6.5–8.1)

## 2016-09-04 LAB — CBC WITH DIFFERENTIAL/PLATELET
BASOS ABS: 0.1 10*3/uL (ref 0.0–0.1)
Basophils Relative: 0 %
EOS PCT: 1 %
Eosinophils Absolute: 0.3 10*3/uL (ref 0.0–0.7)
HEMATOCRIT: 24.7 % — AB (ref 36.0–46.0)
Hemoglobin: 8.7 g/dL — ABNORMAL LOW (ref 12.0–15.0)
LYMPHS ABS: 2.9 10*3/uL (ref 0.7–4.0)
LYMPHS PCT: 14 %
MCH: 32.3 pg (ref 26.0–34.0)
MCHC: 35.2 g/dL (ref 30.0–36.0)
MCV: 91.8 fL (ref 78.0–100.0)
MONO ABS: 1.5 10*3/uL — AB (ref 0.1–1.0)
Monocytes Relative: 7 %
NEUTROS ABS: 15.7 10*3/uL — AB (ref 1.7–7.7)
Neutrophils Relative %: 78 %
Platelets: 373 10*3/uL (ref 150–400)
RBC: 2.69 MIL/uL — ABNORMAL LOW (ref 3.87–5.11)
RDW: 18.3 % — AB (ref 11.5–15.5)
WBC: 20.4 10*3/uL — ABNORMAL HIGH (ref 4.0–10.5)

## 2016-09-04 MED ORDER — HYDROMORPHONE HCL 4 MG/ML IJ SOLN
2.0000 mg | Freq: Once | INTRAMUSCULAR | Status: AC
  Administered 2016-09-04: 2 mg via INTRAVENOUS
  Filled 2016-09-04: qty 1

## 2016-09-04 MED ORDER — DIPHENHYDRAMINE HCL 25 MG PO CAPS
25.0000 mg | ORAL_CAPSULE | Freq: Once | ORAL | Status: AC
  Administered 2016-09-04: 25 mg via ORAL
  Filled 2016-09-04: qty 1

## 2016-09-04 MED ORDER — SODIUM CHLORIDE 0.9% FLUSH
10.0000 mL | INTRAVENOUS | Status: AC | PRN
  Administered 2016-09-04: 10 mL

## 2016-09-04 MED ORDER — KETOROLAC TROMETHAMINE 30 MG/ML IJ SOLN
15.0000 mg | Freq: Once | INTRAMUSCULAR | Status: AC
  Administered 2016-09-04: 15 mg via INTRAVENOUS
  Filled 2016-09-04: qty 1

## 2016-09-04 MED ORDER — PROMETHAZINE HCL 25 MG PO TABS
25.0000 mg | ORAL_TABLET | Freq: Once | ORAL | Status: AC
  Administered 2016-09-04: 25 mg via ORAL
  Filled 2016-09-04: qty 1

## 2016-09-04 MED ORDER — HEPARIN SOD (PORK) LOCK FLUSH 100 UNIT/ML IV SOLN
250.0000 [IU] | INTRAVENOUS | Status: AC | PRN
Start: 2016-09-04 — End: 2016-09-04
  Administered 2016-09-04: 250 [IU]
  Filled 2016-09-04: qty 5

## 2016-09-04 MED ORDER — OXYCODONE HCL 5 MG PO TABS
10.0000 mg | ORAL_TABLET | Freq: Once | ORAL | Status: AC
Start: 2016-09-04 — End: 2016-09-04
  Administered 2016-09-04: 10 mg via ORAL
  Filled 2016-09-04: qty 2

## 2016-09-04 MED ORDER — DEXTROSE-NACL 5-0.45 % IV SOLN
INTRAVENOUS | Status: DC
  Administered 2016-09-04: 11:00:00 via INTRAVENOUS

## 2016-09-04 MED ORDER — HYDROMORPHONE HCL 4 MG/ML IJ SOLN
2.0000 mg | Freq: Once | INTRAMUSCULAR | Status: AC
Start: 2016-09-04 — End: 2016-09-04
  Administered 2016-09-04: 2 mg via INTRAVENOUS
  Filled 2016-09-04: qty 1

## 2016-09-04 NOTE — Discharge Instructions (Signed)
Please follow up with Joaquin CourtsKimberly Harris, FNP as scheduled.   Resume all home medications as prescribed.

## 2016-09-04 NOTE — Telephone Encounter (Signed)
[facility] c/o bilateral leg hip and back pain. Patient rates pain 8/10 on pain scale. Patient denies fever, chest pain, vomiting and diarrhea, or abdominal pain. Patient reports being nauseous. Patient last took her home medication around 7 am this morning with no relief.  Patient placed on a brief hold and provider notified Julianne Handler(Lachina [name] FNP) and advised that patient [facility]. Patient states an understanding and will try to find a ride.

## 2016-09-04 NOTE — Progress Notes (Signed)
[facility] c/o bilateral leg hip and back pain and upon admission it was an 8/10 on pain scale. Patient was treated with IV fluid, IV dilaudid pushes, IV toradol. Po benadryl and a dose of her home medication. At time of discharge patient's pain level was was down to 6/10. Discharge instructions given to patient and patient states an understanding. Patient alert, oriented, and ambulatory at time of discharge. Patient's ride downstairs to transport patient home.

## 2016-09-04 NOTE — H&P (Signed)
[facility] History and Physical   Date: 09/04/2016  Patient name: Brittany Foster Medical record number: [id number] Date of birth: [birth year] Age: 31 y.o. Gender: female PCP: Patient, [name]  Attending physician: Quentin Angst, MD  Chief Complaint: Generalized pain History of Present Illness: Brittany Foster, a 31 year old female with a history of sickle cell anemia, HbSS presents complaining of generalized pain. She has had frequent hospital admissions and emergency room visits over the past several months. She was evaluated in the emergency department on 09/03/2016 at [facility] for a similar problem. She has not identified any palliative or provocative factors contributing to pain crisis. She maintains that she has been following pain medication regimen consistently without sustained relief. Current pain intensity is 8/10 primarily to upper extremities and lower back. She denies headache, fatigue, shortness of breath, chest pains, abdominal pain, nausea, vomiting, or diarrhea.   Meds: Prescriptions Prior to Admission  Medication Sig Dispense Refill Last Dose  . ARIPiprazole (ABILIFY) 10 MG tablet Take 10 mg by mouth daily  3 Past Week at Unknown time  . DULoxetine (CYMBALTA) 60 MG capsule TAKE 1 CAPSULE BY MOUTH DAILY. 30 capsule 3 Past Week at Unknown time  . folic acid (FOLVITE) 1 MG tablet Take 1 tablet (1 mg total) by mouth daily. 90 tablet 3 Past Week at Unknown time  . hydroxyurea (HYDREA) 500 MG capsule TAKE 2 CAPSULES BY MOUTH DAILY. MAY TAKE WITH FOOD TO MINIMIZE GI SIDE EFFECTS. (Patient taking differently: Take 500 mg by mouth daily. ) 180 capsule 3 Past Week at Unknown time  . oxyCODONE-acetaminophen (PERCOCET) 10-325 MG tablet Take 1 tablet by mouth every 6 (six) hours as needed for pain. for pain (Patient taking differently: Take 1 tablet by mouth every 4 (four) hours as needed for pain. for pain) 30 tablet 0 09/04/2016 at Unknown time  . Topiramate ER 100  MG CP24 Take 100 mg by mouth at bedtime.   Past Week at Unknown time  . medroxyPROGESTERone (DEPO-PROVERA) 150 MG/ML injection Inject 150 mg into the muscle every 3 (three) months.    More than a month at Unknown time    Allergies: Ultram [tramadol]; Zofran [ondansetron hcl]; Buprenorphine hcl; Fentanyl; Morphine and related; and Tape Past Medical History:  Diagnosis Date  . Anemia   . Depression, major, recurrent (HCC)   . Migraines   . Sickle cell anemia (HCC)    Past Surgical History:  Procedure Laterality Date  . CESAREAN SECTION    . CHOLECYSTECTOMY  2000  . IR GENERIC HISTORICAL  10/08/2015   IR US GUIDE VASC ACCESS RIGHT 10/08/2015 Simonne Come, MD [facility]  . IR GENERIC HISTORICAL  10/08/2015   IR FLUORO GUIDE CV LINE RIGHT 10/08/2015 Simonne Come, MD [facility]  . MULTIPLE TOOTH EXTRACTIONS N/A   . port a cath placement Right    about 6-7 years ago  . removal of porta cath Right 09/11/15  . TUBAL LIGATION     Family History  Problem Relation Age of Onset  . Sickle cell trait Father   . Sickle cell trait Mother   . Sickle cell anemia Other    Social History   Social History  . Marital status: Single    Spouse name: N/A  . Number of children: N/A  . Years of education: N/A   Occupational History  . None    Social History Main Topics  . Smoking status: Former Games developer  . Smokeless tobacco:  Never Used  . Alcohol use No  . Drug use: No  . Sexual activity: Yes    Partners: Male    Birth control/ protection: Injection     Comment: 1 new partner recently, no concern about STI, uses condoms   Other Topics Concern  . Not on file   Social History Narrative   32 year old daughter    Review of Systems: Review of Systems  Constitutional: Negative.  Negative for weight loss.  HENT: Negative.   Eyes: Negative.   Respiratory: Negative.   Cardiovascular: Negative.   Gastrointestinal: Negative.  Negative for abdominal pain, constipation, diarrhea and heartburn.   Genitourinary: Negative.   Musculoskeletal: Positive for back pain, joint pain and myalgias.  Skin: Negative.   Neurological: Negative for dizziness, focal weakness and headaches.  Psychiatric/Behavioral: Negative.     Physical Exam: Blood pressure 112/74, pulse 100, temperature 99.6 F (37.6 C), temperature source Oral, resp. rate 20, height 5' (1.524 m), weight 132 lb (59.9 kg), SpO2 100 %. BP 112/74 (BP Location: Right Arm)   Pulse 100   Temp 99.6 F (37.6 C) (Oral)   Resp 20   Ht 5' (1.524 m)   Wt 132 lb (59.9 kg)   SpO2 100%   BMI 25.78 kg/m   General Appearance:    Alert, cooperative, no distress, appears stated age  Head:    Normocephalic, without obvious abnormality, atraumatic  Eyes:    PERRL, conjunctiva/corneas clear, EOM's intact, fundi    benign, both eyes  Back:     Symmetric, no curvature, ROM normal, no CVA tenderness  Lungs:     Clear to auscultation bilaterally, respirations unlabored  Chest Wall:    No tenderness or deformity   Heart:    Regular rate and rhythm, S1 and S2 normal, no murmur, rub   or gallop  Abdomen:     Soft, non-tender, bowel sounds active all four quadrants,    no masses, no organomegaly  Extremities:   Extremities normal, atraumatic, no cyanosis or edema  Pulses:   2+ and symmetric all extremities  Skin:   Skin color, texture, turgor normal, no rashes or lesions  Lymph nodes:   Cervical, supraclavicular, and axillary nodes normal  Neurologic:   CNII-XII intact, normal strength, sensation and reflexes    throughout    Lab results: No results found for this or any previous visit (from the past 24 hour(s)).  Imaging results:  No results found.   Assessment & Plan:  Patient will be admitted to the day infusion center for extended observation  Start IV D5.45 for cellular rehydration at 125/hr  Start Toradol 15 mg IV times 1  Start Dilaudid IV per clinician assisted dosages  Patient will be re-evaluated for pain intensity in  the context of function and relationship to baseline as care progresses.  If no significant pain relief, will transfer patient to inpatient services for a higher level of care.   Reviewed labs, potassium low, will repeat CMP. All other labs were consistent with baseline.   Angeles Zehner M 09/04/2016, 10:41 AM

## 2016-09-04 NOTE — Discharge Summary (Signed)
[facility] Discharge Summary   Patient ID: Brittany Foster MRN: [id number] DOB/AGE: 03/26/1985 31 y.o.  Admit date: 09/04/2016 Discharge date: 09/04/2016  Primary Care Physician:  Patient, [name]  Admission Diagnoses:  Active Problems:   Sickle cell pain crisis [facility])  Discharge Medications:  Allergies as of 09/04/2016      Reactions   Ultram [tramadol] Other (See Comments)   Reaction:  Seizures    Zofran [ondansetron Hcl] Nausea And Vomiting   Buprenorphine Hcl Hives, Other (See Comments)   Reaction:  Shaking    Fentanyl Hives   Morphine And Related Hives, Other (See Comments)   Reaction:  Shaking    Tape Rash      Medication List    TAKE these medications   ARIPiprazole 10 MG tablet Commonly known as:  ABILIFY Take 10 mg by mouth daily   DULoxetine 60 MG capsule Commonly known as:  CYMBALTA TAKE 1 CAPSULE BY MOUTH DAILY.   folic acid 1 MG tablet Commonly known as:  FOLVITE Take 1 tablet (1 mg total) by mouth daily.   hydroxyurea 500 MG capsule Commonly known as:  HYDREA TAKE 2 CAPSULES BY MOUTH DAILY. MAY TAKE WITH FOOD TO MINIMIZE GI SIDE EFFECTS. What changed:  how much to take  how to take this  when to take this  additional instructions   medroxyPROGESTERone 150 MG/ML injection Commonly known as:  DEPO-PROVERA Inject 150 mg into the muscle every 3 (three) months.   oxyCODONE-acetaminophen 10-325 MG tablet Commonly known as:  PERCOCET Take 1 tablet by mouth every 6 (six) hours as needed for pain. for pain What changed:  when to take this  additional instructions   Topiramate ER 100 MG Cp24 Take 100 mg by mouth at bedtime.        Consults:  None  Significant Diagnostic Studies:  Dg Chest 2 View  Result Date: 08/22/2016 CLINICAL DATA:  Acute onset of upper back and bilateral rib pain. Current history of sickle cell anemia. Initial encounter. EXAM: CHEST  2 VIEW COMPARISON:  Chest radiograph performed 03/13/2016 FINDINGS:  The lungs are well-aerated and clear. There is no evidence of focal opacification, pleural effusion or pneumothorax. The heart is normal in size; the mediastinal contour is within normal limits. No acute osseous abnormalities are seen. Clips are noted within the right upper quadrant, reflecting prior cholecystectomy. The patient's right-sided central line is noted ending about the mid to distal SVC. IMPRESSION: No acute cardiopulmonary process seen. Electronically Signed   By: Roanna Raider M.D.   On: 08/22/2016 00:58   US Abdomen Complete  Result Date: 08/10/2016 CLINICAL DATA:  Three days of abdominal pain. History of previous cholecystectomy, sickle cell disease. EXAM: ABDOMEN ULTRASOUND COMPLETE COMPARISON:  Abdominopelvic CT scan of August 06, 2016 FINDINGS: Gallbladder: The gallbladder is surgically absent. Common bile duct: Diameter: 3.9 mm Liver: No focal lesion identified. Within normal limits in parenchymal echogenicity. IVC: No abnormality visualized. Pancreas: Visualized portion unremarkable. Spleen: The spleen is known to be small in is not well demonstrated on today's study. Right Kidney: Length: 11.2 cm. Echogenicity within normal limits. No mass or hydronephrosis visualized. Left Kidney: Length: 10.2 cm. Echogenicity within normal limits. No mass or hydronephrosis visualized. Abdominal aorta: No aneurysm visualized. Other findings: None. IMPRESSION: No acute intra-abdominal abnormality is observed. Previous cholecystectomy. Known auto infarction of the spleen. Electronically Signed   By: [name]  [place] M.D.   On: 08/10/2016 08:09   Ct Abdomen Pelvis W Contrast  Result  Date: 08/06/2016 CLINICAL DATA:  Sickle cell pain crisis. Increased pain in both hips and back. EXAM: CT ABDOMEN AND PELVIS WITH CONTRAST TECHNIQUE: Multidetector CT imaging of the abdomen and pelvis was performed using the standard protocol following bolus administration of intravenous contrast. CONTRAST:  100mL ISOVUE-300  IOPAMIDOL (ISOVUE-300) INJECTION 61% COMPARISON:  CT chest exams dating back to 12/29/2013 FINDINGS: Lower chest: Stable 6.6 mm pleural-based right lower lobe density dating back to 2015 consistent with a benign finding. Borderline cardiomegaly. No cute pneumonic consolidation or effusion. No pneumothorax. Hepatobiliary: No focal liver abnormality is seen. Status post cholecystectomy. No biliary dilatation. Pancreas: Unremarkable. No pancreatic ductal dilatation or surrounding inflammatory changes. Spleen: Small shrunken spleen with hyperdense appearance consistent with auto infarction. Adrenals/Urinary Tract: Normal bilateral adrenal glands. Symmetric enhancement of both kidneys without focal mass or obstructive uropathy. Possible minimal scarring along the periphery of the left upper pole. Normal appearing bladder. Stomach/Bowel: Stomach is within normal limits. Appendix appears normal. No evidence of bowel wall thickening, distention, or inflammatory changes. Vascular/Lymphatic: No significant vascular findings are present. No enlarged abdominal or pelvic lymph nodes. Reproductive: Uterus and bilateral adnexa are unremarkable. Small follicles are seen within both ovaries. Other: No abdominal wall hernia or abnormality. No abdominopelvic ascites. Musculoskeletal: No acute fracture or malalignment of the dorsal spine. No evidence of avascular necrosis. Mild diffuse sclerotic appearance of the axial and appendicular skeleton likely represent stigmata of the patient's underlying sickle cell disease. IMPRESSION: 1. Diffuse mild sclerotic appearance of the visualized axial and appendicular skeleton consistent with changes of sickle cell disease. No evidence of avascular necrosis nor acute fracture. 2. Chronic autoinfarcted appearance of the spleen. 3. No acute bowel obstruction or inflammation. Electronically Signed   By: Tollie Ethavid  Kwon M.D.   On: 08/06/2016 21:39   Dg Ugi W/high Density W/kub  Result Date:  08/11/2016 CLINICAL DATA:  31 year old female with sickle cell anemia presenting with nausea and vomiting over the past 5 days. Subsequent encounter. EXAM: UPPER GI SERIES WITH KUB TECHNIQUE: After obtaining a scout radiograph a routine upper GI series was performed using high density barium. FLUOROSCOPY TIME:  Fluoroscopy Time:  1 minutes and 30 seconds Radiation Exposure Index (if provided by the fluoroscopic device): 24.3 mGy COMPARISON:  08/06/2016 CT FINDINGS: Postcholecystectomy. Moderate stool throughout the colon. Scout view otherwise unremarkable. Normal primary esophageal stripping wave. No esophageal mucosa abnormality noted. No reflux with change of patient position. Significant irritability of the gastric antrum and duodenal bulb without ulceration noted. IMPRESSION: Significant irritability of the gastric antrum and duodenal bulb suggestive of changes of gastritis/duodenitis. No discrete ulcer noted. Electronically Signed   By: Lacy DuverneySteven  Olson M.D.   On: 08/11/2016 16:03     [facility] Course: Richmond CampbellMiranda Kuntzman, a 31 year old female with a history of sickle cell anemia, HbSS presents complaining of generalized pain. She has had frequent hospital admissions and emergency room visits over the past several months. She [facility]. She has not identified any palliative or provocative factors contributing to pain crisis. She maintains that she has been following pain medication regimen consistently without sustained relief. Current pain intensity is 8/10 primarily to upper extremities and lower back. She denies headache, fatigue, shortness of breath, chest pains, abdominal pain, nausea, vomiting, or diarrhea  Pain management:  Patient was admitted to the day infusion center for extended observation Reviewed labs, leukocytosis present  IV D5.45 for cellular rehydration at  125/hr Toradol 15 mg times one for  inflammation.  Dilaudid IV per clinician assisted doses for a total of 8 mg Oxycodone IR 10 mg times 1 Pain intensity decreased from 9/10 to 6/10 Patient alert, oriented, and ambulating She is hemodynamically stable   Discharge Instructions:  Resume all home medications Patient to establish care with Joaquin Courts, FNP as scheduled.  She also has an appointment to establish care with Dr. Hyacinth Meeker, hematologist in [place], [place]  The patient was given clear instructions to go to ER or return to medical center if symptoms do not improve, worsen or new problems develop. The patient verbalized understanding.    Physical Exam at Discharge:  BP 112/74 (BP Location: Right Arm)   Pulse 100   Temp 99.6 F (37.6 C) (Oral)   Resp 20   Ht 5' (1.524 m)   Wt 132 lb (59.9 kg)   SpO2 100%   BMI 25.78 kg/m    General Appearance:    Alert, cooperative, no distress, appears stated age  Head:    Normocephalic, without obvious abnormality, atraumatic  Neck:   Supple, symmetrical, trachea midline, no adenopathy;    thyroid:  no enlargement/tenderness/nodules; no carotid   bruit or JVD  Back:     Symmetric, no curvature, ROM normal, no CVA tenderness  Lungs:     Clear to auscultation bilaterally, respirations unlabored  Chest Wall:    No tenderness or deformity   Heart:    Regular rate and rhythm, S1 and S2 normal, no murmur, rub   or gallop  Abdomen:     Soft, non-tender, bowel sounds active all four quadrants,    no masses, no organomegaly  Extremities:   Extremities normal, atraumatic, no cyanosis or edema  Lymph nodes:   Cervical, supraclavicular, and axillary nodes normal  Neurologic:   CNII-XII intact, normal strength, sensation and reflexes    throughout   Disposition at Discharge: 01-Home or Self Care  Discharge Orders: Discharge Instructions    Discharge patient    Complete by:  As directed    Discharge disposition:  01-Home or Self Care   Discharge patient date:  09/04/2016       Condition at Discharge:   Stable  Time spent on Discharge:  Greater than 30 minutes.  Signed: [name],Lachina M 09/04/2016, 3:54 PM

## 2016-09-08 ENCOUNTER — Telehealth (HOSPITAL_COMMUNITY): Payer: Self-pay | Admitting: *Deleted

## 2016-09-08 ENCOUNTER — Non-Acute Institutional Stay (HOSPITAL_COMMUNITY)
Admission: AD | Admit: 2016-09-08 | Discharge: 2016-09-08 | Disposition: A | Discharge status: Home / Self Care | Payer: Medicaid Other | Source: Ambulatory Visit | Attending: Internal Medicine | Admitting: Internal Medicine

## 2016-09-08 ENCOUNTER — Encounter (HOSPITAL_COMMUNITY): Payer: Self-pay | Admitting: *Deleted

## 2016-09-08 DIAGNOSIS — G894 Chronic pain syndrome: Secondary | ICD-10-CM | POA: Insufficient documentation

## 2016-09-08 DIAGNOSIS — Z888 Allergy status to other drugs, medicaments and biological substances status: Secondary | ICD-10-CM | POA: Insufficient documentation

## 2016-09-08 DIAGNOSIS — F1120 Opioid dependence, uncomplicated: Secondary | ICD-10-CM | POA: Diagnosis not present

## 2016-09-08 DIAGNOSIS — F329 Major depressive disorder, single episode, unspecified: Secondary | ICD-10-CM | POA: Diagnosis not present

## 2016-09-08 DIAGNOSIS — Z885 Allergy status to narcotic agent status: Secondary | ICD-10-CM | POA: Insufficient documentation

## 2016-09-08 DIAGNOSIS — Z79899 Other long term (current) drug therapy: Secondary | ICD-10-CM | POA: Insufficient documentation

## 2016-09-08 DIAGNOSIS — D5700 Hb-SS disease with crisis, unspecified: Secondary | ICD-10-CM | POA: Diagnosis not present

## 2016-09-08 LAB — LACTATE DEHYDROGENASE: LDH: 209 U/L — AB (ref 98–192)

## 2016-09-08 MED ORDER — ONDANSETRON HCL 4 MG/2ML IJ SOLN: 4.0000 mg | Freq: Four times a day (QID) | INTRAMUSCULAR | Status: DC | PRN

## 2016-09-08 MED ORDER — OXYCODONE-ACETAMINOPHEN 10-325 MG PO TABS
1.0000 | ORAL_TABLET | ORAL | Status: DC | PRN
Start: 2016-09-08 — End: 2016-09-08

## 2016-09-08 MED ORDER — DEXTROSE-NACL 5-0.45 % IV SOLN
INTRAVENOUS | Status: DC
  Administered 2016-09-08: 09:00:00 via INTRAVENOUS

## 2016-09-08 MED ORDER — DIPHENHYDRAMINE HCL 25 MG PO CAPS
25.0000 mg | ORAL_CAPSULE | ORAL | Status: DC | PRN
  Administered 2016-09-08: 25 mg via ORAL
  Filled 2016-09-08: qty 1

## 2016-09-08 MED ORDER — SODIUM CHLORIDE 0.9% FLUSH: 9.0000 mL | INTRAVENOUS | Status: DC | PRN

## 2016-09-08 MED ORDER — NALOXONE HCL 0.4 MG/ML IJ SOLN: 0.4000 mg | INTRAMUSCULAR | Status: DC | PRN

## 2016-09-08 MED ORDER — PROMETHAZINE HCL 25 MG PO TABS
25.0000 mg | ORAL_TABLET | Freq: Once | ORAL | Status: AC
  Administered 2016-09-08: 25 mg via ORAL
  Filled 2016-09-08: qty 1

## 2016-09-08 MED ORDER — OXYCODONE-ACETAMINOPHEN 10-325 MG PO TABS: 1.0000 | ORAL_TABLET | ORAL | Status: DC | PRN

## 2016-09-08 MED ORDER — OXYCODONE-ACETAMINOPHEN 5-325 MG PO TABS: 1.0000 | ORAL_TABLET | ORAL | Status: DC | PRN

## 2016-09-08 MED ORDER — KETOROLAC TROMETHAMINE 30 MG/ML IJ SOLN
30.0000 mg | Freq: Four times a day (QID) | INTRAMUSCULAR | Status: DC
  Administered 2016-09-08: 30 mg via INTRAVENOUS
  Filled 2016-09-08: qty 1

## 2016-09-08 MED ORDER — HEPARIN SOD (PORK) LOCK FLUSH 100 UNIT/ML IV SOLN
500.0000 [IU] | INTRAVENOUS | Status: AC | PRN
  Administered 2016-09-08: 250 [IU]
  Filled 2016-09-08: qty 5

## 2016-09-08 MED ORDER — SODIUM CHLORIDE 0.9 % IV SOLN
25.0000 mg | INTRAVENOUS | Status: DC | PRN
  Filled 2016-09-08: qty 0.5

## 2016-09-08 MED ORDER — SODIUM CHLORIDE 0.9% FLUSH
10.0000 mL | INTRAVENOUS | Status: AC | PRN
  Administered 2016-09-08: 10 mL

## 2016-09-08 MED ORDER — OXYCODONE HCL 5 MG PO TABS: 5.0000 mg | ORAL_TABLET | ORAL | Status: DC | PRN

## 2016-09-08 MED ORDER — SENNOSIDES-DOCUSATE SODIUM 8.6-50 MG PO TABS: 1.0000 | ORAL_TABLET | Freq: Two times a day (BID) | ORAL | Status: DC

## 2016-09-08 MED ORDER — POLYETHYLENE GLYCOL 3350 17 G PO PACK: 17.0000 g | PACK | Freq: Every day | ORAL | Status: DC | PRN

## 2016-09-08 MED ORDER — HYDROMORPHONE 1 MG/ML IV SOLN
INTRAVENOUS | Status: DC
  Administered 2016-09-08: 10:00:00 via INTRAVENOUS
  Administered 2016-09-08: 10.5 mg via INTRAVENOUS
  Filled 2016-09-08: qty 25

## 2016-09-08 NOTE — Discharge Summary (Signed)
Physician Discharge Summary  Brittany Foster Chowning [id number]:[id number] DOB: 08/19/1985 DOA: 09/08/2016  PCP: Brittany Foster, [name]  Admit date: 09/08/2016  Discharge date: 09/08/2016  Time spent: 30 minutes  Discharge Diagnoses:  Active Problems:   Sickle cell anemia with crisis [facility](HCC)  Discharge Condition: Stable  Diet recommendation: Regular  Filed Weights   09/08/16 0908  Weight: 132 lb (59.9 kg)   History of present illness:  Brittany Foster Oglesby is a 31 y.o. female with history of sickle cell disease, opiate tolerant/dependent, frequent hospital admissions and ED visits, [facility]. She is here today complaining of sickle cell related pain all over. When asked location of her pain, she said "same old same old", rated at 9/10, all over. She denies any fever, no chest pain, no SOB, no cough, no urinary symptom, no dizziness or headache. She is not adherent with her maintenance medications except the opiates.   Hospital Course:  Brittany Foster was admitted to the day hospital with sickle cell painful crisis. Brittany Foster was treated with weight based IV Dilaudid PCA, IV Toradol, and IV fluids. female showed no significant improvement symptomatically, pain improved only from 9 to 7/10 at the time of discharge. Brittany Foster was discharged home in a hemodynamically stable condition.   Discharge Instructions We discussed the need for good hydration, monitoring of hydration status, avoidance of heat, cold, stress, and infection triggers. We discussed the need to be compliant with taking Hydrea and other home medications. Sande was reminded of the need to seek medical attention immediately if any symptom of bleeding, anemia, or infection occurs.  Discharge Exam: Vitals:   09/08/16 1152 09/08/16 1306  BP: (!) 104/55   Pulse: 83   Resp: 14 14  Temp:     General appearance: alert, cooperative and no distress Eyes: conjunctivae/corneas clear. PERRL, EOM's intact. Fundi benign. Neck: no  adenopathy, no carotid bruit, no JVD, supple, symmetrical, trachea midline and thyroid not enlarged, symmetric, no tenderness/mass/nodules Back: symmetric, no curvature. ROM normal. No CVA tenderness. Resp: clear to auscultation bilaterally Chest wall: no tenderness Cardio: regular rate and rhythm, S1, S2 normal, no murmur, click, rub or gallop GI: soft, non-tender; bowel sounds normal; no masses, no organomegaly Extremities: extremities normal, atraumatic, no cyanosis or edema Pulses: 2+ and symmetric Skin: Skin color, texture, turgor normal. No rashes or lesions Neurologic: Grossly normal  Discharge Instructions    Diet - low sodium heart healthy    Complete by:  As directed    Increase activity slowly    Complete by:  As directed      Current Discharge Medication List    CONTINUE these medications which have NOT CHANGED   Details  ARIPiprazole (ABILIFY) 10 MG tablet Take 10 mg by mouth daily Refills: 3    DULoxetine (CYMBALTA) 60 MG capsule TAKE 1 CAPSULE BY MOUTH DAILY. Qty: 30 capsule, Refills: 3   Associated Diagnoses: Chronic pain syndrome    folic acid (FOLVITE) 1 MG tablet Take 1 tablet (1 mg total) by mouth daily. Qty: 90 tablet, Refills: 3   Associated Diagnoses: Hb-SS disease without crisis (HCC)    hydroxyurea (HYDREA) 500 MG capsule TAKE 2 CAPSULES BY MOUTH DAILY. MAY TAKE WITH FOOD TO MINIMIZE GI SIDE EFFECTS. Qty: 180 capsule, Refills: 3   Associated Diagnoses: Hb-SS disease without crisis (HCC)    medroxyPROGESTERone (DEPO-PROVERA) 150 MG/ML injection Inject 150 mg into the muscle every 3 (three) months.     oxyCODONE-acetaminophen (PERCOCET) 10-325 MG tablet Take 1 tablet  by mouth every 6 (six) hours as needed for pain. for pain Qty: 30 tablet, Refills: 0    Topiramate ER 100 MG CP24 Take 100 mg by mouth at bedtime.       Allergies  Allergen Reactions  . Ultram [Tramadol] Other (See Comments)    Reaction:  Seizures   . Zofran [Ondansetron Hcl]  Nausea And Vomiting  . Buprenorphine Hcl Hives and Other (See Comments)    Reaction:  Shaking   . Fentanyl Hives  . Morphine And Related Hives and Other (See Comments)    Reaction:  Shaking   . Tape Rash     Significant Diagnostic Studies: Dg Chest 2 View  Result Date: 08/22/2016 CLINICAL DATA:  Acute onset of upper back and bilateral rib pain. Current history of sickle cell anemia. Initial encounter. EXAM: CHEST  2 VIEW COMPARISON:  Chest radiograph performed 03/13/2016 FINDINGS: The lungs are well-aerated and clear. There is no evidence of focal opacification, pleural effusion or pneumothorax. The heart is normal in size; the mediastinal contour is within normal limits. No acute osseous abnormalities are seen. Clips are noted within the right upper quadrant, reflecting prior cholecystectomy. The Brittany Foster's right-sided central line is noted ending about the mid to distal SVC. IMPRESSION: No acute cardiopulmonary process seen. Electronically Signed   By: Roanna Raider M.D.   On: 08/22/2016 00:58   US Abdomen Complete  Result Date: 08/10/2016 CLINICAL DATA:  Three days of abdominal pain. History of previous cholecystectomy, sickle cell disease. EXAM: ABDOMEN ULTRASOUND COMPLETE COMPARISON:  Abdominopelvic CT scan of August 06, 2016 FINDINGS: Gallbladder: The gallbladder is surgically absent. Common bile duct: Diameter: 3.9 mm Liver: No focal lesion identified. Within normal limits in parenchymal echogenicity. IVC: No abnormality visualized. Pancreas: Visualized portion unremarkable. Spleen: The spleen is known to be small in is not well demonstrated on today's study. Right Kidney: Length: 11.2 cm. Echogenicity within normal limits. No mass or hydronephrosis visualized. Left Kidney: Length: 10.2 cm. Echogenicity within normal limits. No mass or hydronephrosis visualized. Abdominal aorta: No aneurysm visualized. Other findings: None. IMPRESSION: No acute intra-abdominal abnormality is observed. Previous  cholecystectomy. Known auto infarction of the spleen. Electronically Signed   By: [name]  [place] M.D.   On: 08/10/2016 08:09   Dg Ugi W/high Density W/kub  Result Date: 08/11/2016 CLINICAL DATA:  31 year old female with sickle cell anemia presenting with nausea and vomiting over the past 5 days. Subsequent encounter. EXAM: UPPER GI SERIES WITH KUB TECHNIQUE: After obtaining a scout radiograph a routine upper GI series was performed using high density barium. FLUOROSCOPY TIME:  Fluoroscopy Time:  1 minutes and 30 seconds Radiation Exposure Index (if provided by the fluoroscopic device): 24.3 mGy COMPARISON:  08/06/2016 CT FINDINGS: Postcholecystectomy. Moderate stool throughout the colon. Scout view otherwise unremarkable. Normal primary esophageal stripping wave. No esophageal mucosa abnormality noted. No reflux with change of Brittany Foster position. Significant irritability of the gastric antrum and duodenal bulb without ulceration noted. IMPRESSION: Significant irritability of the gastric antrum and duodenal bulb suggestive of changes of gastritis/duodenitis. No discrete ulcer noted. Electronically Signed   By: Lacy Duverney M.D.   On: 08/11/2016 16:03    Signed:  Jeanann Lewandowsky MD, MHA, FACP, FAAP, CPE   09/08/2016, 3:21 PM

## 2016-09-08 NOTE — Telephone Encounter (Signed)
After speaking with the provider, pt was told that she could come in for treatment. Pt voiced understanding and stated she could be here in 40 mins.

## 2016-09-08 NOTE — Telephone Encounter (Signed)
Pt called [facility]. Pt states her pain is 8/10 and it is in her legs, hips and back. She denies fever, chest pain, vomiting, diarrhea and abd pain. She c/o of some nausea. She last took her Oxycodone at 7 am. Will check with the provider and give her a call back. Pt voiced understanding.

## 2016-09-08 NOTE — Progress Notes (Signed)
Patient [facility] c/o bilateral leg hip and back pain and upon admission it was an 8/10 on pain scale. Patient was treated with IV fluid and placed Dilaudid PCA pump, IV Toradol and po benadryl  At time of discharge patient's pain level was was down to 7/10. Discharge instructions given to patient and patient states an understanding. Patient alert, oriented, and ambulatory at time of discharge. Patient's ride downstairs to transport patient home.

## 2016-09-08 NOTE — H&P (Signed)
[facility] History and Physical  Brittany Foster VQQ:[id number] DOB: [birth year] DOA: 09/08/2016  Chief Complaint: Pain  HPI: Brittany Foster is a 31 y.o. female with history of sickle cell disease, opiate tolerant/dependent, frequent hospital admissions and ED visits, discharged from [facility] few days ago. She is here today complaining of sickle cell related pain all over. When asked location of her pain, she said "same old same old", rated at 9/10, all over. She denies any fever, no chest pain, no SOB, no cough, no urinary symptom, no dizziness or headache. She is not adherent with her maintenance medications except the opiates.   Systemic Review: General: The patient denies anorexia, fever, weight loss Cardiac: Denies chest pain, syncope, palpitations, pedal edema  Respiratory: Denies cough, shortness of breath, wheezing GI: Denies severe indigestion/heartburn, abdominal pain, nausea, vomiting, diarrhea and constipation GU: Denies hematuria, incontinence, dysuria  Musculoskeletal: Denies arthritis  Skin: Denies suspicious skin lesions Neurologic: Denies focal weakness or numbness, change in vision  Past Medical History:  Diagnosis Date  . Anemia   . Depression, major, recurrent (HCC)   . Migraines   . Sickle cell anemia (HCC)     Past Surgical History:  Procedure Laterality Date  . CESAREAN SECTION    . CHOLECYSTECTOMY  2000  . IR GENERIC HISTORICAL  10/08/2015   IR US GUIDE VASC ACCESS RIGHT 10/08/2015 Simonne Come, MD [facility]-INTER[facility] RAD  . IR GENERIC HISTORICAL  10/08/2015   IR FLUORO GUIDE CV LINE RIGHT 10/08/2015 Simonne Come, MD [facility]INTER[facility]  . MULTIPLE TOOTH EXTRACTIONS N/A   . port a cath placement Right    about 6-7 years ago  . removal of porta cath Right 09/11/15  . TUBAL LIGATION      Allergies  Allergen Reactions  . Ultram [Tramadol] Other (See Comments)    Reaction:  Seizures   . Zofran [Ondansetron Hcl] Nausea And Vomiting  . Buprenorphine Hcl Hives and  Other (See Comments)    Reaction:  Shaking   . Fentanyl Hives  . Morphine And Related Hives and Other (See Comments)    Reaction:  Shaking   . Tape Rash    Family History  Problem Relation Age of Onset  . Sickle cell trait Father   . Sickle cell trait Mother   . Sickle cell anemia Other       Prior to Admission medications   Medication Sig Start Date End Date Taking? Authorizing Provider  ARIPiprazole (ABILIFY) 10 MG tablet Take 10 mg by mouth daily 02/13/16  Yes [provider]  DULoxetine (CYMBALTA) 60 MG capsule TAKE 1 CAPSULE BY MOUTH DAILY. 11/27/15  Yes Quentin Angst, MD  folic acid (FOLVITE) 1 MG tablet Take 1 tablet (1 mg total) by mouth daily. 01/17/16  Yes Sabiha Sura E, MD  hydroxyurea (HYDREA) 500 MG capsule TAKE 2 CAPSULES BY MOUTH DAILY. MAY TAKE WITH FOOD TO MINIMIZE GI SIDE EFFECTS. Patient taking differently: Take 500 mg by mouth daily.  05/12/16  Yes Quentin Angst, MD  medroxyPROGESTERone (DEPO-PROVERA) 150 MG/ML injection Inject 150 mg into the muscle every 3 (three) months.    Yes [provider]  oxyCODONE-acetaminophen (PERCOCET) 10-325 MG tablet Take 1 tablet by mouth every 6 (six) hours as needed for pain. for pain Patient taking differently: Take 1 tablet by mouth every 4 (four) hours as needed for pain. for pain 08/12/16  Yes Altha Harm, MD  Topiramate ER 100 MG CP24 Take 100 mg by mouth at bedtime.  Yes [provider]     Physical Exam: Vitals:   09/08/16 0908  BP: 112/69  Pulse: 79  Resp: 20  Temp: 98.7 F (37.1 C)  TempSrc: Oral  SpO2: 100%  Weight: 132 lb (59.9 kg)  Height: 5' (1.524 m)    General: Alert, awake, afebrile, anicteric, not in obvious distress HEENT: Normocephalic and Atraumatic, Mucous membranes pink                PERRLA; EOM intact; No scleral icterus,                 Nares: Patent, Oropharynx: Clear, Fair Dentition                 Neck: FROM, no cervical  lymphadenopathy, thyromegaly, carotid bruit or JVD;  CHEST WALL: No tenderness  CHEST: Normal respiration, clear to auscultation bilaterally  HEART: Regular rate and rhythm; no murmurs rubs or gallops  BACK: No kyphosis or scoliosis; no CVA tenderness  ABDOMEN: Positive Bowel Sounds, soft, non-tender; no masses, no organomegaly EXTREMITIES: No cyanosis, clubbing, or edema SKIN:  no rash or ulceration  CNS: Alert and Oriented x 4, Nonfocal exam, CN 2-12 intact  Labs on Admission:  Basic Metabolic Panel:  Recent Labs Lab 09/04/16 1059  NA 139  K 3.0*  CL 114*  CO2 20*  GLUCOSE 143*  BUN 7  CREATININE 0.37*  CALCIUM 9.0   Liver Function Tests:  Recent Labs Lab 09/04/16 1059  AST 38  ALT 32  ALKPHOS 79  BILITOT 1.9*  PROT 7.6  ALBUMIN 4.0   No results for input(s): LIPASE, AMYLASE in the last 168 hours. No results for input(s): AMMONIA in the last 168 hours. CBC:  Recent Labs Lab 09/04/16 1059  WBC 20.4*  NEUTROABS 15.7*  HGB 8.7*  HCT 24.7*  MCV 91.8  PLT 373   Cardiac Enzymes: No results for input(s): CKTOTAL, CKMB, CKMBINDEX, TROPONINI in the last 168 hours.  BNP (last 3 results) No results for input(s): BNP in the last 8760 hours.  ProBNP (last 3 results) No results for input(s): PROBNP in the last 8760 hours.  CBG: No results for input(s): GLUCAP in the last 168 hours.   Assessment/Plan Active Problems:   Sickle cell anemia with crisis (HCC)   Ad[facility] to the Day [facility]  IVF D5 .45% Saline @ 125 mls/hour  Weight based Dilaudid PCA started within 30 minutes of admission  IV Toradol 30 mg Q 6 H  Monitor vitals very closely, Re-evaluate pain scale every hour  2 L of Oxygen by Force  Patient will be re-evaluated for pain in the context of function and relationship to baseline as care progresses.  If no significant relieve from pain (remains above 5/10) will transfer patient to inpatient services for further evaluation and management  Code  Status: Full  Family Communication: None  DVT Prophylaxis: Ambulate as tolerated   Time spent: 35 Minutes  Dannisha Eckmann, MD, MHA, FACP, FAAP, CPE  If 7PM-7AM, please contact night-coverage [email or web address] 09/08/2016, 9:15 AM

## 2016-09-11 ENCOUNTER — Encounter: Payer: Self-pay | Admitting: Family Medicine

## 2016-09-11 ENCOUNTER — Ambulatory Visit (INDEPENDENT_AMBULATORY_CARE_PROVIDER_SITE_OTHER): Payer: Medicaid Other | Admitting: Family Medicine

## 2016-09-11 ENCOUNTER — Telehealth (HOSPITAL_COMMUNITY): Payer: Self-pay | Admitting: Internal Medicine

## 2016-09-11 VITALS — BP 108/68 | HR 100 | Temp 98.9°F | Resp 12 | Ht 60.0 in | Wt 130.6 lb

## 2016-09-11 DIAGNOSIS — F339 Major depressive disorder, recurrent, unspecified: Secondary | ICD-10-CM

## 2016-09-11 DIAGNOSIS — H543 Unqualified visual loss, both eyes: Secondary | ICD-10-CM | POA: Diagnosis not present

## 2016-09-11 DIAGNOSIS — Z3042 Encounter for surveillance of injectable contraceptive: Secondary | ICD-10-CM

## 2016-09-11 DIAGNOSIS — D571 Sickle-cell disease without crisis: Secondary | ICD-10-CM | POA: Diagnosis not present

## 2016-09-11 DIAGNOSIS — G43909 Migraine, unspecified, not intractable, without status migrainosus: Secondary | ICD-10-CM | POA: Diagnosis not present

## 2016-09-11 LAB — POCT URINALYSIS DIP (DEVICE)
BILIRUBIN URINE: NEGATIVE
Glucose, UA: NEGATIVE mg/dL
HGB URINE DIPSTICK: NEGATIVE
Ketones, ur: NEGATIVE mg/dL
LEUKOCYTES UA: NEGATIVE
NITRITE: NEGATIVE
Protein, ur: NEGATIVE mg/dL
SPECIFIC GRAVITY, URINE: 1.015 (ref 1.005–1.030)
Urobilinogen, UA: 2 mg/dL — ABNORMAL HIGH (ref 0.0–1.0)
pH: 6 (ref 5.0–8.0)

## 2016-09-11 LAB — POCT URINE PREGNANCY: Preg Test, Ur: NEGATIVE

## 2016-09-11 MED ORDER — FOLIC ACID 1 MG PO TABS: 1.0000 mg | ORAL_TABLET | Freq: Every day | ORAL | 3 refills | Status: DC

## 2016-09-11 MED ORDER — TOPIRAMATE ER 100 MG PO CAP24: 200.0000 mg | ORAL_CAPSULE | Freq: Every day | ORAL | 3 refills | Status: DC

## 2016-09-11 NOTE — Patient Instructions (Addendum)
I have increased your Topamax 200 mg once daily for headaches prevention.  I have refilled your folic acid.  Return next month for PAP and Depo injection. Sickle Cell Anemia, Adult Sickle cell anemia is a condition where your red blood cells are shaped like sickles. Red blood cells carry oxygen through the body. Sickle-shaped red blood cells do not live as long as normal red blood cells. They also clump together and block blood from flowing through the blood vessels. These things prevent the body from getting enough oxygen. Sickle cell anemia causes organ damage and pain. It also increases the risk of infection. Follow these instructions at home:  Drink enough fluid to keep your pee (urine) clear or pale yellow. Drink more in hot weather and during exercise.  Do not smoke. Smoking lowers oxygen levels in the blood.  Only take over-the-counter or prescription medicines as told by your doctor.  Take antibiotic medicines as told by your doctor. Make sure you finish them even if you start to feel better.  Take supplements as told by your doctor.  Consider wearing a medical alert bracelet. This tells anyone caring for you in an emergency of your condition.  When traveling, keep your medical information, doctors' names, and the medicines you take with you at all times.  If you have a fever, do not take fever medicines right away. This could cover up a problem. Tell your doctor.  Keep all follow-up visits with your doctor. Sickle cell anemia requires regular medical care. Contact a doctor if: You have a fever. Get help right away if:  You feel dizzy or faint.  You have new belly (abdominal) pain, especially on the left side near the stomach area.  You have a lasting, often uncomfortable and painful erection of the penis (priapism). If it is not treated right away, you will become unable to have sex (impotence).  You have numbness in your arms or legs or you have a hard time moving  them.  You have a hard time talking.  You have a fever or lasting symptoms for more than 2-3 days.  You have a fever and your symptoms suddenly get worse.  You have signs or symptoms of infection. These include: ? Chills. ? Being more tired than normal (lethargy). ? Irritability. ? Poor eating. ? Throwing up (vomiting).  You have pain that is not helped with medicine.  You have shortness of breath.  You have pain in your chest.  You are coughing up pus-like or bloody mucus.  You have a stiff neck.  Your feet or hands swell or have pain.  Your belly looks bloated.  Your joints hurt. This information is not intended to replace advice given to you by your health care provider. Make sure you discuss any questions you have with your health care provider. Document Released: 12/07/2012 Document Revised: 07/25/2015 Document Reviewed: 09/28/2012 Elsevier Interactive Patient Education  2017 [employer]

## 2016-09-11 NOTE — Telephone Encounter (Signed)
Pt called and states experiencing pain in legs, hips, and back; pt denies having chest pain, shortness of breath, abdominal pain, or diarrhea; NP notified; pt advised to take home pain medications as prescribed; pt verbalizes understanding

## 2016-09-11 NOTE — Progress Notes (Signed)
Patient ID: Brittany Foster, female    DOB: 06/06/1985, 31 y.o.   MRN: [id number]  PCP: Bing Neighbors, FNP  Chief Complaint  Patient presents with  . Hospitalization Follow-up    Subjective:  HPI Brittany Foster is a 31 y.o. female presents to establish care for primary care services only.  Patient's medical history includes Hb-SS Sickle Cell Anemia, Chronic Pain, migraine headaches, opioid dependence, major depressive disorder, and Vitamin D deficiency. Brittany Foster sickle cell anemia and chronic pain management will be managed by Dr. Hyacinth Meeker with [facility] hematology, [place]. She has a visit schedule with the practice on August 13. She has very frequent admissions to multiple hospitals and visits to emergency departments for chronic pain, related to sickle cell disease.  General Health and Wellness  Brittany Foster last eye exam was 2 years ago. She complains of diminished vision and reports that her eye glass prescription has been the same for long period of time.  She is overdue for her gynecological wellness exam. Reports no prior history of abnormal PAPs. Brittany Foster doesn't have a regular menstrual cycle as she is on depro-provera injections with her next injection due next month. Complains of chronic migraines which occur weekly. She is currently prescribed Topamax for headache prevention and reports that this medication is ineffective at the current dose. Headaches occur unilateral and bilateral and are often accompanied by nausea without vomiting. She typically takes opioid pain medications when headaches are severe.  Immunization History  Administered Date(s) Administered  . Influenza,inj,Quad PF,36+ Mos 02/03/2014  . Meningococcal Conjugate 08/07/2014  . Pneumococcal Conjugate-13 06/11/2014  . Tdap 02/16/2014    Social History   Social History  . Marital status: Single    Spouse name: N/A  . Number of children: N/A  . Years of education: N/A   Occupational History  . None     Social History Main Topics  . Smoking status: Former Games developer  . Smokeless tobacco: Never Used  . Alcohol use No  . Drug use: No  . Sexual activity: Yes    Partners: Male    Birth control/ protection: Injection     Comment: 1 new partner recently, no concern about STI, uses condoms   Other Topics Concern  . Not on file   Social History Narrative   81 year old daughter    Family History  Problem Relation Age of Onset  . Sickle cell trait Father   . Sickle cell trait Mother   . Sickle cell anemia Other    Review of Systems See history of present illness Patient Active Problem List   Diagnosis Date Noted  . Sickle cell anemia with crisis (HCC) 09/08/2016  . Nausea & vomiting 08/06/2016  . Sickle cell crisis (HCC) 05/24/2016  . Sickle cell anemia with pain (HCC) 02/14/2016  . Hypotension 01/26/2016  . Opiate dependence (HCC) 01/23/2016  . MDD (major depressive disorder), recurrent severe, without psychosis (HCC) 01/10/2016  . Vitamin D deficiency 01/08/2015  . Chronic pain syndrome 01/08/2015  . 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 Anemia 02/09/2014  . Neuropathy (HCC) 02/09/2014  . Sickle cell pain crisis (HCC) 12/30/2013    Allergies  Allergen Reactions  . Ultram [Tramadol] Other (See Comments)    Reaction:  Seizures   . Zofran [Ondansetron Hcl] Nausea And Vomiting  . Buprenorphine Hcl Hives and Other (See Comments)    Reaction:  Shaking   . Fentanyl Hives  . Morphine And Related Hives and Other (See Comments)  Reaction:  Shaking   . Tape Rash    Prior to Admission medications   Medication Sig Start Date End Date Taking? Authorizing Provider  ARIPiprazole (ABILIFY) 10 MG tablet Take 10 mg by mouth daily 02/13/16  Yes [provider]  DULoxetine (CYMBALTA) 60 MG capsule TAKE 1 CAPSULE BY MOUTH DAILY. 11/27/15  Yes Quentin AngstJegede, Olugbemiga E, MD  folic acid (FOLVITE) 1 MG tablet Take 1 tablet (1 mg total) by mouth daily. 01/17/16  Yes Jegede,  Olugbemiga E, MD  hydroxyurea (HYDREA) 500 MG capsule TAKE 2 CAPSULES BY MOUTH DAILY. MAY TAKE WITH FOOD TO MINIMIZE GI SIDE EFFECTS. Patient taking differently: Take 500 mg by mouth daily.  05/12/16  Yes Quentin AngstJegede, Olugbemiga E, MD  medroxyPROGESTERone (DEPO-PROVERA) 150 MG/ML injection Inject 150 mg into the muscle every 3 (three) months.    Yes [provider]  oxyCODONE-acetaminophen (PERCOCET) 10-325 MG tablet Take 1 tablet by mouth every 6 (six) hours as needed for pain. for pain Patient taking differently: Take 1 tablet by mouth every 4 (four) hours as needed for pain. for pain 08/12/16  Yes Altha HarmMatthews, Michelle A, MD  Topiramate ER 100 MG CP24 Take 100 mg by mouth at bedtime.   Yes [provider]    Past Medical, Surgical Family and Social History reviewed and updated.    Objective:   Today's Vitals   09/11/16 1339  BP: 108/68  Pulse: 100  Resp: 12  Temp: 98.9 F (37.2 C)  TempSrc: Oral  SpO2: 97%  Weight: 130 lb 9.6 oz (59.2 kg)  Height: 5' (1.524 m)    Wt Readings from Last 3 Encounters:  09/11/16 130 lb 9.6 oz (59.2 kg)  09/08/16 132 lb (59.9 kg)  09/04/16 132 lb (59.9 kg)   Physical Exam  Constitutional: She is oriented to person, place, and time. She appears well-developed and well-nourished.  HENT:  Head: Normocephalic and atraumatic.  Mouth/Throat: No oropharyngeal exudate.  Eyes: Pupils are equal, round, and reactive to light. Conjunctivae and EOM are normal.  Neck: Normal range of motion. Neck supple.  Cardiovascular: Normal rate, regular rhythm, normal heart sounds and intact distal pulses.   Abdominal: Soft. She exhibits distension. There is no tenderness.  Musculoskeletal: Normal range of motion.  Lymphadenopathy:    She has no cervical adenopathy.  Neurological: She is alert and oriented to person, place, and time. She displays normal reflexes. Coordination normal.  Skin: Skin is warm and dry.  Psychiatric: She has a normal mood and  affect. Her behavior is normal. Judgment and thought content normal.   Assessment & Plan:  1. Depo-Provera contraceptive status - POCT urine pregnancy, negative -Schedule follow-up visit around that though due date  2. Hb-SS disease without crisis (HCC), symptoms controlled today - folic acid (FOLVITE) 1 MG tablet; Take 1 tablet (1 mg total) by mouth daily.  Dispense: 90 tablet; Refill: 3 -Reviewed most recent labs drawn less than 7 days ago all of which were stable. -Brittany Foster is ineligible to receive any chronic pain medication from clinic, as this part of her care was discontinued by the medical director. -Keep follow-up appointment with hematology on August 13 management of sickle cell disease and chronic pain.  3. Impaired vision in both eyes - Ambulatory referral to Ophthalmology  4. Recurrent major depressive disorder, remission status unspecified (HCC) -Continue current medication regimen.  5. Migraine without status migrainosus, not intractable, unspecified migraine type -Increase Topamax to 200 mg daily at bedtime for better control of chronic headaches.  RTC: Schedule a follow-up to obtain contraceptive injection and gynecological exam.  Godfrey Pick. Tiburcio Pea, MSN, FNP-C [facility]  [address] [place], [place] [address] [phone]

## 2016-09-28 ENCOUNTER — Encounter (HOSPITAL_COMMUNITY): Payer: Self-pay | Admitting: *Deleted

## 2016-09-28 ENCOUNTER — Telehealth (HOSPITAL_COMMUNITY): Payer: Self-pay | Admitting: *Deleted

## 2016-09-28 ENCOUNTER — Non-Acute Institutional Stay (HOSPITAL_COMMUNITY)
Admission: AD | Admit: 2016-09-28 | Discharge: 2016-09-28 | Disposition: A | Discharge status: Home / Self Care | Payer: Medicaid Other | Source: Ambulatory Visit | Attending: Internal Medicine | Admitting: Internal Medicine

## 2016-09-28 DIAGNOSIS — F17210 Nicotine dependence, cigarettes, uncomplicated: Secondary | ICD-10-CM | POA: Insufficient documentation

## 2016-09-28 DIAGNOSIS — D5700 Hb-SS disease with crisis, unspecified: Secondary | ICD-10-CM | POA: Insufficient documentation

## 2016-09-28 DIAGNOSIS — F329 Major depressive disorder, single episode, unspecified: Secondary | ICD-10-CM | POA: Diagnosis not present

## 2016-09-28 DIAGNOSIS — F1120 Opioid dependence, uncomplicated: Secondary | ICD-10-CM | POA: Insufficient documentation

## 2016-09-28 DIAGNOSIS — Z888 Allergy status to other drugs, medicaments and biological substances status: Secondary | ICD-10-CM | POA: Insufficient documentation

## 2016-09-28 DIAGNOSIS — Z79899 Other long term (current) drug therapy: Secondary | ICD-10-CM | POA: Insufficient documentation

## 2016-09-28 DIAGNOSIS — Z885 Allergy status to narcotic agent status: Secondary | ICD-10-CM | POA: Diagnosis not present

## 2016-09-28 LAB — COMPREHENSIVE METABOLIC PANEL
ALBUMIN: 4.5 g/dL (ref 3.5–5.0)
ALK PHOS: 86 U/L (ref 38–126)
ALT: 29 U/L (ref 14–54)
AST: 47 U/L — AB (ref 15–41)
Anion gap: 6 (ref 5–15)
BUN: 9 mg/dL (ref 6–20)
CHLORIDE: 107 mmol/L (ref 101–111)
CO2: 22 mmol/L (ref 22–32)
CREATININE: 0.51 mg/dL (ref 0.44–1.00)
Calcium: 8.8 mg/dL — ABNORMAL LOW (ref 8.9–10.3)
GFR calc Af Amer: >60 mL/min (ref >60)
GFR calc non Af Amer: >60 mL/min (ref >60)
Glucose, Bld: 103 mg/dL — ABNORMAL HIGH (ref 65–99)
Potassium: 3.4 mmol/L — ABNORMAL LOW (ref 3.5–5.1)
SODIUM: 135 mmol/L (ref 135–145)
Total Bilirubin: 3.2 mg/dL — ABNORMAL HIGH (ref 0.3–1.2)
Total Protein: 8.2 g/dL — ABNORMAL HIGH (ref 6.5–8.1)

## 2016-09-28 LAB — CBC WITH DIFFERENTIAL/PLATELET
BASOS PCT: 1 %
Basophils Absolute: 0.1 10*3/uL (ref 0.0–0.1)
EOS ABS: 0.2 10*3/uL (ref 0.0–0.7)
Eosinophils Relative: 2 %
HEMATOCRIT: 26.1 % — AB (ref 36.0–46.0)
HEMOGLOBIN: 9 g/dL — AB (ref 12.0–15.0)
Lymphocytes Relative: 23 %
Lymphs Abs: 2.8 10*3/uL (ref 0.7–4.0)
MCH: 32.4 pg (ref 26.0–34.0)
MCHC: 34.5 g/dL (ref 30.0–36.0)
MCV: 93.9 fL (ref 78.0–100.0)
Monocytes Absolute: 0.9 10*3/uL (ref 0.1–1.0)
Monocytes Relative: 8 %
NEUTROS ABS: 8.1 10*3/uL — AB (ref 1.7–7.7)
NEUTROS PCT: 66 %
Platelets: 325 10*3/uL (ref 150–400)
RBC: 2.78 MIL/uL — AB (ref 3.87–5.11)
RDW: 17.8 % — ABNORMAL HIGH (ref 11.5–15.5)
WBC: 12.1 10*3/uL — AB (ref 4.0–10.5)

## 2016-09-28 LAB — RETICULOCYTES
RBC.: 2.78 MIL/uL — AB (ref 3.87–5.11)
Retic Count, Absolute: 194.6 10*3/uL — ABNORMAL HIGH (ref 19.0–186.0)
Retic Ct Pct: 7 % — ABNORMAL HIGH (ref 0.4–3.1)

## 2016-09-28 MED ORDER — DIPHENHYDRAMINE HCL 25 MG PO CAPS: 25.0000 mg | ORAL_CAPSULE | Freq: Once | ORAL | Status: DC

## 2016-09-28 MED ORDER — HYDROMORPHONE HCL-NACL 0.5-0.9 MG/ML-% IV SOSY
1.0000 mg | PREFILLED_SYRINGE | Freq: Once | INTRAVENOUS | Status: AC
  Administered 2016-09-28: 1 mg via INTRAVENOUS
  Filled 2016-09-28: qty 2

## 2016-09-28 MED ORDER — DIPHENHYDRAMINE HCL 25 MG PO CAPS
25.0000 mg | ORAL_CAPSULE | Freq: Once | ORAL | Status: AC
  Administered 2016-09-28: 25 mg via ORAL
  Filled 2016-09-28: qty 1

## 2016-09-28 MED ORDER — KETOROLAC TROMETHAMINE 30 MG/ML IJ SOLN
30.0000 mg | Freq: Once | INTRAMUSCULAR | Status: AC
  Administered 2016-09-28: 30 mg via INTRAVENOUS
  Filled 2016-09-28: qty 1

## 2016-09-28 MED ORDER — HEPARIN SOD (PORK) LOCK FLUSH 100 UNIT/ML IV SOLN
250.0000 [IU] | INTRAVENOUS | Status: AC | PRN
  Administered 2016-09-28: 250 [IU]
  Filled 2016-09-28: qty 5

## 2016-09-28 MED ORDER — HYDROMORPHONE HCL 4 MG/ML IJ SOLN: 2.0000 mg | Freq: Once | INTRAMUSCULAR | Status: DC

## 2016-09-28 MED ORDER — HYDROMORPHONE HCL 4 MG/ML IJ SOLN
2.0000 mg | Freq: Once | INTRAMUSCULAR | Status: AC
  Administered 2016-09-28: 2 mg via INTRAVENOUS
  Filled 2016-09-28: qty 1

## 2016-09-28 MED ORDER — OXYCODONE-ACETAMINOPHEN 5-325 MG PO TABS: 2.0000 | ORAL_TABLET | Freq: Once | ORAL | Status: DC

## 2016-09-28 MED ORDER — SODIUM CHLORIDE 0.9% FLUSH
10.0000 mL | INTRAVENOUS | Status: AC | PRN
  Administered 2016-09-28: 10 mL

## 2016-09-28 MED ORDER — HYDROMORPHONE HCL-NACL 0.5-0.9 MG/ML-% IV SOSY
2.0000 mg | PREFILLED_SYRINGE | Freq: Once | INTRAVENOUS | Status: AC
  Administered 2016-09-28: 2 mg via INTRAVENOUS
  Filled 2016-09-28: qty 4

## 2016-09-28 MED ORDER — DEXTROSE-NACL 5-0.45 % IV SOLN
INTRAVENOUS | Status: DC
  Administered 2016-09-28: 10:00:00 via INTRAVENOUS

## 2016-09-28 MED ORDER — OXYCODONE-ACETAMINOPHEN 5-325 MG PO TABS
2.0000 | ORAL_TABLET | Freq: Once | ORAL | Status: AC
  Administered 2016-09-28: 2 via ORAL
  Filled 2016-09-28: qty 2

## 2016-09-28 NOTE — Telephone Encounter (Signed)
Pt called to request treatment at day hospital for sickle cell crisis. C/o pain 8/10 since yesterday afternoon. Pt took her Oxycodone at 5am with no relief. Nurse practitioner agreed to treat her in day hospital. Pt notified to come in.  Ardyth GalAnderson, Ingri Diemer Ann, RN 09/28/2016

## 2016-09-28 NOTE — Progress Notes (Signed)
[facility] c/o generalized pain r/t SCC. Patient reported a pain score of /10 on pain scale. Patient was treated with IV fluids, IV Toradol, IV Dilaudid, PO Percocet x2. At time of discharge patient rates his pain a 6/10 on pain scale. Discharge instructions given to patient and patient verbalized understanding. Patient alert, oriented, and ambulatory at time of discharge.

## 2016-09-28 NOTE — Discharge Instructions (Signed)
Sickle Cell Anemia, Adult °Sickle cell anemia is a condition where your red blood cells are shaped like sickles. Red blood cells carry oxygen through the body. Sickle-shaped red blood cells do not live as long as normal red blood cells. They also clump together and block blood from flowing through the blood vessels. These things prevent the body from getting enough oxygen. Sickle cell anemia causes organ damage and pain. It also increases the risk of infection. °Follow these instructions at home: °· Drink enough fluid to keep your pee (urine) clear or pale yellow. Drink more in hot weather and during exercise. °· Do not smoke. Smoking lowers oxygen levels in the blood. °· Only take over-the-counter or prescription medicines as told by your doctor. °· Take antibiotic medicines as told by your doctor. Make sure you finish them even if you start to feel better. °· Take supplements as told by your doctor. °· Consider wearing a medical alert bracelet. This tells anyone caring for you in an emergency of your condition. °· When traveling, keep your medical information, doctors' names, and the medicines you take with you at all times. °· If you have a fever, do not take fever medicines right away. This could cover up a problem. Tell your doctor. °· Keep all follow-up visits with your doctor. Sickle cell anemia requires regular medical care. °Contact a doctor if: °You have a fever. °Get help right away if: °· You feel dizzy or faint. °· You have new belly (abdominal) pain, especially on the left side near the stomach area. °· You have a lasting, often uncomfortable and painful erection of the penis (priapism). If it is not treated right away, you will become unable to have sex (impotence). °· You have numbness in your arms or legs or you have a hard time moving them. °· You have a hard time talking. °· You have a fever or lasting symptoms for more than 2-3 days. °· You have a fever and your symptoms suddenly get  worse. °· You have signs or symptoms of infection. These include: °? Chills. °? Being more tired than normal (lethargy). °? Irritability. °? Poor eating. °? Throwing up (vomiting). °· You have pain that is not helped with medicine. °· You have shortness of breath. °· You have pain in your chest. °· You are coughing up pus-like or bloody mucus. °· You have a stiff neck. °· Your feet or hands swell or have pain. °· Your belly looks bloated. °· Your joints hurt. °This information is not intended to replace advice given to you by your health care provider. Make sure you discuss any questions you have with your health care provider. °Document Released: 12/07/2012 Document Revised: 07/25/2015 Document Reviewed: 09/28/2012 °Elsevier Interactive Patient Education © 2017 [employer] ° °

## 2016-09-28 NOTE — Discharge Summary (Signed)
[facility] Discharge Summary   Patient ID: Brittany Foster MRN: [id number] DOB/AGE: 32/03/1985 32 y.o.  Admit date: 09/28/2016 Discharge date: 09/28/2016  Primary Care Physician:  Brittany NeighborsHarris, Cheresa Siers S, FNP  Admission Diagnoses:  Active Problems:   Sickle cell pain crisis (HCC)   Opiate dependence (HCC)   Discharge Medications:  Allergies as of 09/28/2016      Reactions   Ultram [tramadol] Other (See Comments)   Reaction:  Seizures    Zofran [ondansetron Hcl] Nausea And Vomiting   Buprenorphine Hcl Hives, Other (See Comments)   Reaction:  Shaking    Fentanyl Hives   Morphine And Related Hives, Other (See Comments)   Reaction:  Shaking    Tape Rash     Consults:  N/A  Significant Diagnostic Studies:  N/A  [facility] Course: Brittany Foster a 31 y.o.femalewith a diagnosis of Sickle Cell Anemia presents today with a complaint of low back and bilateral leg pain over the last several days. [name] reports her pain today is consistent with previous sickle cell pain crisis episodes. Current pain intensity 8/10 and she characterizes her pain as aching and throbbing. Pain was unimproved with one dose of home pain medication. [name] denies headache, fever, shortness of breath, chest pain, dysuria, nausea, vomiting, or diarrhea. She was admitted to the day infusion center for extended observation and the following was her course of treatment:  -D51/2 @ 125 cc/hr for cellular rehydration. -Toradol 30 mg IV for inflammation. -Hydromorphone 6 mg in 4 divided doses  -Percocet 10 mg / 650 mg (home medication) -Lab values are consistent with patients baseline -Follow-up with PCP within 1 week -The patient was given clear instructions to go to ER or return to medical center if symptoms do not improve, worsen or new problems develop. The patient verbalized understanding    Physical Exam at Discharge:  BP 114/66 (BP Location: Left Arm, Patient Position:  Sitting, Cuff Size: Normal)   Pulse (!) 106   Temp 99.3 F (37.4 C) (Oral)   Resp 18   Ht 5' (1.524 m)   Wt 132 lb (59.9 kg)   SpO2 100%   BMI 25.78 kg/m  Physical Exam: General Appearance:    Alert, cooperative, no distress, appears stated age  Head:    Normocephalic, without obvious abnormality, atraumatic  Eyes:    PERRL, Conjunctiva/cornea clear, EOM'Foster intact.  Back:     Symmetric, no curvature, ROM normal, no CVA tenderness  Lungs:     Clear to auscultation bilaterally, respirations unlabored  Chest Wall:    No tenderness or deformity   Heart:    Regular rate and rhythm, S1 and S2 normal, no murmur, rub   or gallop  Abdomen:     Soft, non-tender, bowel sounds active all four quadrants,    no masses, no organomegaly  Extremities:   Extremities normal, atraumatic, no cyanosis or edema  Pulses:   2+ and symmetric all extremities  Skin:   Skin color, texture, turgor normal, no rashes or lesions  Neurologic:   Normal strength, sensation     Disposition at Discharge: 01-Home or Self Care  Discharge Orders:  -Continue to hydrate and take prescribed home medications as ordered. -Resume all home medications. -Keep upcoming appointment  -The patient was given clear instructions to go to ER or return to medical center if symptoms do not improve, worsen or new problems develop. The patient verbalized understanding.  Condition at Discharge:   Stable  Time spent on  Discharge:  Greater than 20 minutes.  Signed: Joaquin CourtsKimberly Mariene Dickerman 09/28/2016, 3:19 PM

## 2016-09-28 NOTE — H&P (Signed)
[facility] History and Physical   Date: 09/28/2016  Patient name: Brittany Foster Medical record number: [id number] Date of birth: 11/04/1985 Age: 31 y.o. Gender: female PCP: Bing NeighborsHarris, Topacio Cella S, FNP  Attending physician: Quentin AngstJegede, Olugbemiga E, MD  Chief Complaint: Pain   History of Present Illness:  Brittany Foster is a 31 y.o. female with a diagnosis of Sickle Cell Anemia presents today with a complaint of low back and bilateral leg pain over the last several days. [name] reports her pain today is consistent with previous sickle cell pain crisis episodes. Current pain intensity 8/10 and she characterizes her pain as aching and throbbing. She has a history of frequent ED visits. She reports taking 1 dose of oxycodone-acetaminophen around 5:00 am today without  relief of symptoms. Patient denies headache, fever, shortness of breath, chest pain, dysuria, nausea, vomiting, or diarrhea.   Meds: Prescriptions Prior to Admission  Medication Sig Dispense Refill Last Dose  . ARIPiprazole (ABILIFY) 10 MG tablet Take 10 mg by mouth daily  3 09/26/2016  . DULoxetine (CYMBALTA) 60 MG capsule TAKE 1 CAPSULE BY MOUTH DAILY. 30 capsule 3 09/26/2016  . folic acid (FOLVITE) 1 MG tablet Take 1 tablet (1 mg total) by mouth daily. 90 tablet 3 09/26/2016  . hydroxyurea (HYDREA) 500 MG capsule TAKE 2 CAPSULES BY MOUTH DAILY. MAY TAKE WITH FOOD TO MINIMIZE GI SIDE EFFECTS. (Patient taking differently: Take 500 mg by mouth daily. ) 180 capsule 3 09/26/2016  . medroxyPROGESTERone (DEPO-PROVERA) 150 MG/ML injection Inject 150 mg into the muscle every 3 (three) months.    More than a month at Unknown time  . oxyCODONE-acetaminophen (PERCOCET) 10-325 MG tablet Take 1 tablet by mouth every 6 (six) hours as needed for pain. for pain (Patient taking differently: Take 1 tablet by mouth every 4 (four) hours as needed for pain. for pain) 30 tablet 0 09/28/2016 at 0500  . Topiramate ER 100 MG CP24 Take 200 mg by mouth at  bedtime. 60 capsule 3 09/26/2016    Allergies: Ultram [tramadol]; Zofran [ondansetron hcl]; Buprenorphine hcl; Fentanyl; Morphine and related; and Tape Past Medical History:  Diagnosis Date  . Anemia   . Depression, major, recurrent (HCC)   . Migraines   . Sickle cell anemia (HCC)    Past Surgical History:  Procedure Laterality Date  . CESAREAN SECTION    . CHOLECYSTECTOMY  2000  . IR GENERIC HISTORICAL  10/08/2015   IR US GUIDE VASC ACCESS RIGHT 10/08/2015 Simonne ComeJohn Watts, MD [facility]  . IR GENERIC HISTORICAL  10/08/2015   IR FLUORO GUIDE CV LINE RIGHT 10/08/2015 Simonne ComeJohn Watts, MD [facility]  . MULTIPLE TOOTH EXTRACTIONS N/A   . port a cath placement Right    about 6-7 years ago  . removal of porta cath Right 09/11/15  . TUBAL LIGATION     Family History  Problem Relation Age of Onset  . Sickle cell trait Father   . Sickle cell trait Mother   . Sickle cell anemia Other    Social History   Social History  . Marital status: Single    Spouse name: N/A  . Number of children: N/A  . Years of education: N/A   Occupational History  . None    Social History Main Topics  . Smoking status: Current Some Day Smoker    Packs/day: 1.00  . Smokeless tobacco: Current User  . Alcohol use No  . Drug use: No  . Sexual activity: Yes    Partners:  Male    Birth control/ protection: Injection     Comment: 1 new partner recently, no concern about STI, uses condoms   Other Topics Concern  . Not on file   Social History Narrative   32 year old daughter    Review of Systems: A comprehensive review of systems was negative  Respiratory: Negative  Cardiovascular: Negative  Gastrointestinal: Negative  Musculoskeletal: positive pain   Physical Exam: Blood pressure 114/66, pulse (!) 106, temperature 99.3 F (37.4 C), temperature source Oral, resp. rate 18, height 5' (1.524 m), weight 132 lb (59.9 kg), SpO2 100 %. General Appearance:    Alert, cooperative, no distress, appears stated age   Head:    Normocephalic, without obvious abnormality, atraumatic  Eyes:    PERRL, EOM's intact, scleral icterus   Back:     Symmetric, no curvature, ROM normal, no CVA tenderness  Lungs:     Clear to auscultation bilaterally, respirations unlabored  Chest Wall:    No tenderness or deformity   Heart:    Regular rate and rhythm, S1 and S2 normal, no murmur, rub   or gallop  Abdomen:     Soft, non-tender, bowel sounds active all four quadrants,    no masses, no organomegaly  Extremities:   Extremities normal, atraumatic, no cyanosis or edema  Pulses:   2+ and symmetric all extremities  Skin:   Skin color, texture, turgor normal, no rashes or lesions  Neurologic:   Normal strength     Lab results: Pending  Imaging results:  N/A  Assessment & Plan:  Patient will be admitted to the day infusion center for extended observation  Start IV D5.45 for cellular rehydration at 125/hr  IV Toradol 30 mg   Administer hydromorphone 2 mg IV.Will evaluate the necessity of administration of additional doses.  Patient will be re-evaluated for pain intensity in the context of function and relationship to baseline as care progresses.  If no significant pain relief, will transfer patient to inpatient services for a higher level of care.   Will check CMP, Reticulocyte, and CBC w/differential  Joaquin CourtsKimberly Khalid Lacko 09/28/2016, 9:55 AM

## 2016-10-01 ENCOUNTER — Non-Acute Institutional Stay (HOSPITAL_COMMUNITY)
Admission: AD | Admit: 2016-10-01 | Discharge: 2016-10-01 | Disposition: A | Discharge status: Home / Self Care | Payer: Medicaid Other | Source: Ambulatory Visit | Attending: Internal Medicine | Admitting: Internal Medicine

## 2016-10-01 ENCOUNTER — Encounter (HOSPITAL_COMMUNITY): Payer: Self-pay | Admitting: *Deleted

## 2016-10-01 ENCOUNTER — Telehealth (HOSPITAL_COMMUNITY): Payer: Self-pay | Admitting: *Deleted

## 2016-10-01 DIAGNOSIS — Z79899 Other long term (current) drug therapy: Secondary | ICD-10-CM | POA: Diagnosis not present

## 2016-10-01 DIAGNOSIS — D5700 Hb-SS disease with crisis, unspecified: Secondary | ICD-10-CM | POA: Diagnosis not present

## 2016-10-01 DIAGNOSIS — F329 Major depressive disorder, single episode, unspecified: Secondary | ICD-10-CM | POA: Diagnosis not present

## 2016-10-01 DIAGNOSIS — F17210 Nicotine dependence, cigarettes, uncomplicated: Secondary | ICD-10-CM | POA: Diagnosis not present

## 2016-10-01 LAB — CBC WITH DIFFERENTIAL/PLATELET
BASOS ABS: 0.1 10*3/uL (ref 0.0–0.1)
BASOS PCT: 0 %
EOS ABS: 0 10*3/uL (ref 0.0–0.7)
EOS PCT: 0 %
HCT: 23.8 % — ABNORMAL LOW (ref 36.0–46.0)
Hemoglobin: 8.4 g/dL — ABNORMAL LOW (ref 12.0–15.0)
LYMPHS PCT: 26 %
Lymphs Abs: 3.2 10*3/uL (ref 0.7–4.0)
MCH: 32.8 pg (ref 26.0–34.0)
MCHC: 35.3 g/dL (ref 30.0–36.0)
MCV: 93 fL (ref 78.0–100.0)
Monocytes Absolute: 0.8 10*3/uL (ref 0.1–1.0)
Monocytes Relative: 6 %
Neutro Abs: 8.1 10*3/uL — ABNORMAL HIGH (ref 1.7–7.7)
Neutrophils Relative %: 68 %
PLATELETS: 390 10*3/uL (ref 150–400)
RBC: 2.56 MIL/uL — AB (ref 3.87–5.11)
RDW: 20.9 % — ABNORMAL HIGH (ref 11.5–15.5)
WBC: 12.1 10*3/uL — AB (ref 4.0–10.5)

## 2016-10-01 MED ORDER — OXYCODONE-ACETAMINOPHEN 5-325 MG PO TABS
2.0000 | ORAL_TABLET | Freq: Once | ORAL | Status: AC
  Administered 2016-10-01: 2 via ORAL
  Filled 2016-10-01: qty 2

## 2016-10-01 MED ORDER — HYDROMORPHONE HCL 4 MG PO TABS
2.0000 mg | ORAL_TABLET | Freq: Once | ORAL | Status: AC
  Administered 2016-10-01: 2 mg via ORAL
  Filled 2016-10-01: qty 1

## 2016-10-01 MED ORDER — HYDROMORPHONE HCL-NACL 0.5-0.9 MG/ML-% IV SOSY
2.0000 mg | PREFILLED_SYRINGE | Freq: Once | INTRAVENOUS | Status: AC
  Administered 2016-10-01: 2 mg via INTRAVENOUS
  Filled 2016-10-01: qty 4

## 2016-10-01 MED ORDER — SODIUM CHLORIDE 0.9% FLUSH
10.0000 mL | INTRAVENOUS | Status: AC | PRN
  Administered 2016-10-01: 10 mL

## 2016-10-01 MED ORDER — HYDROMORPHONE HCL 2 MG/ML IJ SOLN
2.0000 mg | Freq: Once | INTRAMUSCULAR | Status: DC
Start: 2016-10-01 — End: 2016-10-01

## 2016-10-01 MED ORDER — DEXTROSE-NACL 5-0.45 % IV SOLN
INTRAVENOUS | Status: DC
  Administered 2016-10-01: 10:00:00 via INTRAVENOUS

## 2016-10-01 MED ORDER — HEPARIN SOD (PORK) LOCK FLUSH 100 UNIT/ML IV SOLN
250.0000 [IU] | INTRAVENOUS | Status: AC | PRN
  Administered 2016-10-01: 250 [IU]
  Filled 2016-10-01: qty 5

## 2016-10-01 MED ORDER — KETOROLAC TROMETHAMINE 30 MG/ML IJ SOLN
30.0000 mg | Freq: Once | INTRAMUSCULAR | Status: AC
  Administered 2016-10-01: 30 mg via INTRAVENOUS

## 2016-10-01 MED ORDER — KETOROLAC TROMETHAMINE 30 MG/ML IJ SOLN
30.0000 mg | Freq: Once | INTRAMUSCULAR | Status: DC
  Filled 2016-10-01: qty 1

## 2016-10-01 MED ORDER — HYDROMORPHONE HCL-NACL 0.5-0.9 MG/ML-% IV SOSY
1.0000 mg | PREFILLED_SYRINGE | Freq: Once | INTRAVENOUS | Status: AC
  Administered 2016-10-01: 1 mg via INTRAVENOUS
  Filled 2016-10-01: qty 2

## 2016-10-01 MED ORDER — PROMETHAZINE HCL 25 MG PO TABS
25.0000 mg | ORAL_TABLET | Freq: Once | ORAL | Status: AC
  Administered 2016-10-01: 25 mg via ORAL
  Filled 2016-10-01: qty 1

## 2016-10-01 MED ORDER — DIPHENHYDRAMINE HCL 25 MG PO CAPS
25.0000 mg | ORAL_CAPSULE | Freq: Once | ORAL | Status: AC
  Administered 2016-10-01: 25 mg via ORAL
  Filled 2016-10-01: qty 1

## 2016-10-01 NOTE — Progress Notes (Signed)
Pt received to the Patient Care Unit for treatment of pain. Her pain is in her legs, arms and back. She was treated with IV fluids, Dilaudid pushes, Percocet, po Dilaudid and Toradol. Pt's pain was a 7/10 on admission and at discharge. The provider spoke with the patient and told her that she could come back in the morning if she was still in pain. Pt voiced understanding. Pt received d/c instructions with verbal understanding. She was alert, oriented and ambulatory at discharge.

## 2016-10-01 NOTE — Telephone Encounter (Signed)
Pt called requesting to come to the [facility] Care Center for treatment. Pt states her pain is in her legs, arms and back. She states her pain is 7/10. She denies fever, chest pain, vomiting,diarrhea or abdominal pain. She states she is having some nausea. Pt agree to be placed on hold while speaking with the provider.  After speaking with the provider, pt was told that she could come in for treatment. Pt voiced understanding. She said that she could get her in 45 minutes.

## 2016-10-01 NOTE — H&P (Signed)
[facility] History and Physical   Date: 10/01/2016  Patient name: Brittany Foster Medical record number: [id number] Date of birth: 10/14/1985 Age: 31 y.o. Gender: female PCP: Bing NeighborsHarris, Lacye Mccarn S, FNP  Attending physician: Quentin AngstJegede, Olugbemiga E, MD  Chief Complaint: Pain   History of Present Illness: Brittany CassisMiranda Payneis a 31 y.o.femalewith a diagnosis of Sickle Cell Anemia presents today with a complaint of low back, bilateral arm and bilateral leg pain over the last several days. Graciana visited the ED yesterday for similar type pain, and received pain management then discharged. Cassie reports her pain today is consistent with previous sickle cell pain crisis episodes. Current pain intensity 10/10 and she characterizes her pain as aching and throbbing. She reports one episode vomiting this morning after taking 1 Percocet.  Nausea has resolved, as she is now tolerating chips and soda while obtaining history.  Patient denies headache, fever, shortness of breath, chest pain, dysuria, nausea, vomiting, or diarrhea. [name] is being admitted to the day infusion center for extended observation.  Meds: Prescriptions Prior to Admission  Medication Sig Dispense Refill Last Dose  . ARIPiprazole (ABILIFY) 10 MG tablet Take 10 mg by mouth daily  3 09/26/2016  . DULoxetine (CYMBALTA) 60 MG capsule TAKE 1 CAPSULE BY MOUTH DAILY. 30 capsule 3 09/26/2016  . folic acid (FOLVITE) 1 MG tablet Take 1 tablet (1 mg total) by mouth daily. 90 tablet 3 09/26/2016  . hydroxyurea (HYDREA) 500 MG capsule TAKE 2 CAPSULES BY MOUTH DAILY. MAY TAKE WITH FOOD TO MINIMIZE GI SIDE EFFECTS. (Patient taking differently: Take 500 mg by mouth daily. ) 180 capsule 3 09/26/2016  . medroxyPROGESTERone (DEPO-PROVERA) 150 MG/ML injection Inject 150 mg into the muscle every 3 (three) months.    More than a month at Unknown time  . oxyCODONE-acetaminophen (PERCOCET) 10-325 MG tablet Take 1 tablet by mouth every 6 (six) hours as  needed for pain. for pain (Patient taking differently: Take 1 tablet by mouth every 4 (four) hours as needed for pain. for pain) 30 tablet 0 09/28/2016 at 0500  . Topiramate ER 100 MG CP24 Take 200 mg by mouth at bedtime. 60 capsule 3 09/26/2016    Allergies: Ultram [tramadol]; Zofran [ondansetron hcl]; Buprenorphine hcl; Fentanyl; Morphine and related; and Tape Past Medical History:  Diagnosis Date  . Anemia   . Depression, major, recurrent (HCC)   . Migraines   . Sickle cell anemia (HCC)    Past Surgical History:  Procedure Laterality Date  . CESAREAN SECTION    . CHOLECYSTECTOMY  2000  . IR GENERIC HISTORICAL  10/08/2015   IR US GUIDE VASC ACCESS RIGHT 10/08/2015 Simonne ComeJohn Watts, MD [facility]  . IR GENERIC HISTORICAL  10/08/2015   IR FLUORO GUIDE CV LINE RIGHT 10/08/2015 Simonne ComeJohn Watts, MD [facility]  . MULTIPLE TOOTH EXTRACTIONS N/A   . port a cath placement Right    about 6-7 years ago  . removal of porta cath Right 09/11/15  . TUBAL LIGATION     Family History  Problem Relation Age of Onset  . Sickle cell trait Father   . Sickle cell trait Mother   . Sickle cell anemia Other    Social History   Social History  . Marital status: Single    Spouse name: N/A  . Number of children: N/A  . Years of education: N/A   Occupational History  . None    Social History Main Topics  . Smoking status: Current Some Day Smoker  Packs/day: 1.00  . Smokeless tobacco: Current User  . Alcohol use No  . Drug use: No  . Sexual activity: Yes    Partners: Male    Birth control/ protection: Injection     Comment: 1 new partner recently, no concern about STI, uses condoms   Other Topics Concern  . Not on file   Social History Narrative   32 year old daughter    Review of Systems:   Physical Exam: Blood pressure 119/80, pulse 85, temperature 98.7 F (37.1 C), temperature source Oral, height 5' (1.524 m), weight 132 lb (59.9 kg), SpO2 100 %.  General Appearance:  Alert,  cooperative, no distress, appears stated age  Head:  Normocephalic, without obvious abnormality, atraumatic  Eyes:  PERRL, EOM's intact, scleral icterus   Back:  Symmetric, no curvature, ROM normal, no CVA tenderness  Lungs:  Clear to auscultation bilaterally, respirations unlabored  Chest Wall:  No tenderness or deformity  Heart:  Regular rate and rhythm, S1 and S2 normal, no murmur, rub or gallop  Abdomen:  Soft, non-tender, bowel sounds active all four quadrants,  no masses, no organomegaly  Extremities: Extremities normal, atraumatic, no cyanosis or edema  Pulses: 2+ and symmetric all extremities  Skin: Skin color, texture, turgor normal, no rashes or lesions  Neurologic: Normal strength      Lab results: Pending   Imaging results:  N/A  Assessment & Plan:  Patient will be admitted to the day infusion center for extended observation  Start IV D5.45 for cellular rehydration at 125/hr  IV Toradol 30 mg   Administer hydromorphone 2 mg IV.Will evaluate the necessity of administration of additional doses.  Patient will be re-evaluated for pain intensity in the context of function and relationship to baseline as care progresses.  If no significant pain relief, will transfer patient to inpatient services for a higher level of care.   Will check CMP, Reticulocyte, and CBC w/differential    Joaquin CourtsKimberly Hatcher Froning 10/01/2016, 9:38 AM

## 2016-10-02 ENCOUNTER — Non-Acute Institutional Stay (HOSPITAL_COMMUNITY)
Admission: AD | Admit: 2016-10-02 | Discharge: 2016-10-02 | Disposition: A | Discharge status: Home / Self Care | Payer: Medicaid Other | Source: Ambulatory Visit | Attending: Internal Medicine | Admitting: Internal Medicine

## 2016-10-02 ENCOUNTER — Encounter (HOSPITAL_COMMUNITY): Payer: Self-pay | Admitting: *Deleted

## 2016-10-02 DIAGNOSIS — Z79899 Other long term (current) drug therapy: Secondary | ICD-10-CM | POA: Insufficient documentation

## 2016-10-02 DIAGNOSIS — D5700 Hb-SS disease with crisis, unspecified: Secondary | ICD-10-CM | POA: Diagnosis not present

## 2016-10-02 DIAGNOSIS — F17210 Nicotine dependence, cigarettes, uncomplicated: Secondary | ICD-10-CM | POA: Diagnosis not present

## 2016-10-02 MED ORDER — HYDROMORPHONE HCL 2 MG/ML IJ SOLN
2.0000 mg | Freq: Once | INTRAMUSCULAR | Status: AC
  Administered 2016-10-02: 2 mg via INTRAVENOUS
  Filled 2016-10-02: qty 1

## 2016-10-02 MED ORDER — HEPARIN SOD (PORK) LOCK FLUSH 100 UNIT/ML IV SOLN: 250.0000 [IU] | INTRAVENOUS | Status: DC | PRN

## 2016-10-02 MED ORDER — HYDROMORPHONE HCL-NACL 0.5-0.9 MG/ML-% IV SOSY
1.0000 mg | PREFILLED_SYRINGE | Freq: Once | INTRAVENOUS | Status: AC
  Administered 2016-10-02: 1 mg via INTRAVENOUS
  Filled 2016-10-02: qty 2

## 2016-10-02 MED ORDER — HEPARIN SOD (PORK) LOCK FLUSH 100 UNIT/ML IV SOLN
500.0000 [IU] | INTRAVENOUS | Status: AC | PRN
  Administered 2016-10-02: 250 [IU]
  Filled 2016-10-02: qty 5

## 2016-10-02 MED ORDER — DIPHENHYDRAMINE HCL 25 MG PO CAPS
25.0000 mg | ORAL_CAPSULE | Freq: Once | ORAL | Status: AC
  Administered 2016-10-02: 25 mg via ORAL
  Filled 2016-10-02: qty 1

## 2016-10-02 MED ORDER — KETOROLAC TROMETHAMINE 30 MG/ML IJ SOLN
30.0000 mg | Freq: Once | INTRAMUSCULAR | Status: AC
  Administered 2016-10-02: 30 mg via INTRAVENOUS
  Filled 2016-10-02: qty 1

## 2016-10-02 MED ORDER — OXYCODONE-ACETAMINOPHEN 5-325 MG PO TABS
2.0000 | ORAL_TABLET | Freq: Once | ORAL | Status: AC
  Administered 2016-10-02: 2 via ORAL
  Filled 2016-10-02: qty 2

## 2016-10-02 MED ORDER — SODIUM CHLORIDE 0.9% FLUSH
10.0000 mL | INTRAVENOUS | Status: AC | PRN
  Administered 2016-10-02: 10 mL

## 2016-10-02 MED ORDER — DEXTROSE-NACL 5-0.45 % IV SOLN
INTRAVENOUS | Status: DC
  Administered 2016-10-02: 10:00:00 via INTRAVENOUS

## 2016-10-02 MED ORDER — HYDROMORPHONE HCL 4 MG PO TABS: 4.0000 mg | ORAL_TABLET | Freq: Once | ORAL | Status: DC

## 2016-10-02 MED ORDER — PROMETHAZINE HCL 25 MG PO TABS
25.0000 mg | ORAL_TABLET | Freq: Once | ORAL | Status: DC
  Filled 2016-10-02: qty 1

## 2016-10-02 MED ORDER — SODIUM CHLORIDE 0.9% FLUSH: 10.0000 mL | INTRAVENOUS | Status: DC | PRN

## 2016-10-02 NOTE — Discharge Summary (Signed)
[facility] Discharge Summary   Patient ID: Brittany Foster MRN: [id number] DOB/AGE: 32/03/1985 32 y.o.  Admit date: 10/01/2016 Discharge date: 10/02/2016  Primary Care Physician:  Bing NeighborsHarris, Semisi Biela S, FNP  Admission Diagnoses:  Active Problems:   Sickle cell pain crisis [facility](HCC)  Discharge Medications:  Allergies as of 10/01/2016      Reactions   Ultram [tramadol] Other (See Comments)   Reaction:  Seizures    Zofran [ondansetron Hcl] Nausea And Vomiting   Buprenorphine Hcl Hives, Other (See Comments)   Reaction:  Shaking    Fentanyl Hives   Morphine And Related Hives, Other (See Comments)   Reaction:  Shaking    Tape Rash      Medication List    ASK your doctor about these medications   ARIPiprazole 10 MG tablet Commonly known as:  ABILIFY Take 10 mg by mouth daily   DULoxetine 60 MG capsule Commonly known as:  CYMBALTA TAKE 1 CAPSULE BY MOUTH DAILY.   folic acid 1 MG tablet Commonly known as:  FOLVITE Take 1 tablet (1 mg total) by mouth daily.   hydroxyurea 500 MG capsule Commonly known as:  HYDREA TAKE 2 CAPSULES BY MOUTH DAILY. MAY TAKE WITH FOOD TO MINIMIZE GI SIDE EFFECTS.   medroxyPROGESTERone 150 MG/ML injection Commonly known as:  DEPO-PROVERA Inject 150 mg into the muscle every 3 (three) months.   oxyCODONE-acetaminophen 10-325 MG tablet Commonly known as:  PERCOCET Take 1 tablet by mouth every 6 (six) hours as needed for pain. for pain   Topiramate ER 100 MG Cp24 Commonly known as:  TROKENDI XR Take 200 mg by mouth at bedtime.      Consults: N/A  Significant Diagnostic Studies:  Results for orders placed or performed during the hospital encounter of 10/01/16  CBC with Differential/Platelet  Result Value Ref Range   WBC 12.1 (H) 4.0 - 10.5 K/uL   RBC 2.56 (L) 3.87 - 5.11 MIL/uL   Hemoglobin 8.4 (L) 12.0 - 15.0 g/dL   HCT 40.923.8 (L) 81.136.0 - 91.446.0 %   MCV 93.0 78.0 - 100.0 fL   MCH 32.8 26.0 - 34.0 pg   MCHC 35.3 30.0 - 36.0 g/dL   RDW  78.220.9 (H) 95.611.5 - 15.5 %   Platelets 390 150 - 400 K/uL   Neutrophils Relative % 68 %   Neutro Abs 8.1 (H) 1.7 - 7.7 K/uL   Lymphocytes Relative 26 %   Lymphs Abs 3.2 0.7 - 4.0 K/uL   Monocytes Relative 6 %   Monocytes Absolute 0.8 0.1 - 1.0 K/uL   Eosinophils Relative 0 %   Eosinophils Absolute 0.0 0.0 - 0.7 K/uL   Basophils Relative 0 %   Basophils Absolute 0.1 0.0 - 0.1 K/uL    [facility] Course: Brittany Foster is a 31 y.o. female with a diagnosis of Sickle Cell Anemia presents today with a complaint of low back, bilateral arm and bilateral leg pain over the last several days. [name] visited the ED yesterday for similar type pain, and received pain management then discharged. Brittany Foster reports her pain today is consistent with previous sickle cell pain crisis episodes. Current pain intensity 10/10 and she characterizes her pain as aching and throbbing. She reports one episode vomiting this morning after taking 1 Percocet.  Nausea has resolved, as she is now tolerating chips and soda while obtaining history.  Patient denies headache, fever, shortness of breath, chest pain, dysuria, nausea, vomiting, or diarrhea. Brittany Foster was admitted to the  day infusion center for extended observation and the following was her course of treatment: -D51/2 @ 125 cc/hr for cellular rehydration. -Toradol 30 mg IV for inflammation. -Hydromorphone 6 mg in 4 divided doses  -Percocet 10 mg / 650 mg (home medication) -her pain was reduced to 7/10, she requested an additional dose of IV medication. This request was declined. She was administered Hydromorphone 2 mg PO one time dose prior to discharge and advised to return tomorrow morning if pain does not improve. -Lab values are consistent with patients baseline -Follow-up with PCP within 1 week -The patient was given clear instructions to go to ER or return to medical center if symptoms do not improve, worsen or new problems develop. The patient verbalized  understanding   Physical Exam at Discharge: BP 119/80 (BP Location: Right Arm)   Pulse 85   Temp 98.7 F (37.1 C) (Oral)   Ht 5' (1.524 m)   Wt 132 lb (59.9 kg)   SpO2 100%   BMI 25.78 kg/m    Physical Exam: Blood pressure 114/66, pulse (!) 106, temperature 99.3 F (37.4 C), temperature source Oral, resp. rate 18, height 5' (1.524 m), weight 132 lb (59.9 kg), SpO2 100 %. General Appearance:  Alert, cooperative, no distress, appears stated age  Head:  Normocephalic, without obvious abnormality, atraumatic  Eyes:  PERRL, EOM's intact, scleral icterus   Back:  Symmetric, no curvature, ROM normal, no CVA tenderness  Lungs:  Clear to auscultation bilaterally, respirations unlabored  Chest Wall:  No tenderness or deformity  Heart:  Regular rate and rhythm, S1 and S2 normal, no murmur, rub or gallop  Abdomen:  Soft, non-tender, bowel sounds active all four quadrants,  no masses, no organomegaly  Extremities: Extremities normal, atraumatic, no cyanosis or edema  Pulses: 2+ and symmetric all extremities  Skin: Skin color, texture, turgor normal, no rashes or lesions  Neurologic: Normal strength   Disposition at Discharge: 01-Home or Self Care  Discharge Orders:  -Continue to hydrate and take prescribed home medications as ordered. -Resume all home medications. -Keep upcoming appointment  -The patient was given clear instructions to go to ER or return to medical center if symptoms do not improve, worsen or new problems develop. The patient verbalized understanding.  Condition at Discharge:   Stable  Time spent on Discharge:  Greater than 20 minutes.  Signed: Joaquin CourtsKimberly Tarahji Ramthun 10/02/2016, 8:17 AM

## 2016-10-02 NOTE — Discharge Summary (Signed)
[facility] Discharge Summary   Patient ID: Brittany Foster MRN: [id number] DOB/AGE: 32/03/1985 32 y.o.  Admit date: 10/02/2016 Discharge date: 10/02/2016  Primary Care Physician:  Bing NeighborsHarris, Juwann Sherk S, FNP  Admission Diagnoses:  Active Problems:   Sickle cell pain crisis [facility](HCC)   Discharge Medications:  Allergies as of 10/02/2016      Reactions   Ultram [tramadol] Other (See Comments)   Reaction:  Seizures    Zofran [ondansetron Hcl] Nausea And Vomiting   Buprenorphine Hcl Hives, Other (See Comments)   Reaction:  Shaking    Fentanyl Hives   Morphine And Related Hives, Other (See Comments)   Reaction:  Shaking    Tape Rash     Current Meds  Medication Sig  . ARIPiprazole (ABILIFY) 10 MG tablet Take 10 mg by mouth daily  . DULoxetine (CYMBALTA) 60 MG capsule TAKE 1 CAPSULE BY MOUTH DAILY.  . folic acid (FOLVITE) 1 MG tablet Take 1 tablet (1 mg total) by mouth daily.  . hydroxyurea (HYDREA) 500 MG capsule TAKE 2 CAPSULES BY MOUTH DAILY. MAY TAKE WITH FOOD TO MINIMIZE GI SIDE EFFECTS. (Patient taking differently: Take 500 mg by mouth daily. )  . oxyCODONE-acetaminophen (PERCOCET) 10-325 MG tablet Take 1 tablet by mouth every 6 (six) hours as needed for pain. for pain (Patient taking differently: Take 1 tablet by mouth every 4 (four) hours as needed for pain. for pain)  . Topiramate ER 100 MG CP24 Take 200 mg by mouth at bedtime.    Consults:  N/A  Significant Diagnostic Studies: N/A   [facility] Course: Brittany Foster a 31y.o.femalewith a diagnosis of Sickle Cell Anemia presents today with a complaint of low back, bilateral armand bilateral leg pain over the last several days. Brittany Foster has visited the ED and been admitted to the day infusion center twice this week  for similar type pain, and received pain management then discharged. Brittany Foster reports her pain today is consistent with previous sickle cell pain crisis this week. Her current pain intensity  10/10 and she characterizes her pain as aching and throbbing. She reports one episode vomiting this morning after taking 1 Percocet.Brittany Foster reports that she completed all of her pain medication during the night as her pain intensity would not improve. Patient denies headache, fever, shortness of breath, chest pain, dysuria, nausea, vomiting, or diarrhea. Brittany Foster was admitted to the day infusion center for extended observation: -D51/2 @ 125 cc/hr for cellular rehydration. -Toradol 30 mg IV for inflammation. -Hydromorphone 6 mg in 4divided doses  -Percocet 10 mg / 650 mg (home medication) -her pain was reduced to 7/10, she requested an additional dose of IV medication. This request was declined. She was offered Hydromorphone 4 mg PO one time dose prior to discharge and but she declines. -Follow-up with PCP within 1 week -The patient was given clear instructions to go to ER or return to medical center if symptoms do not improve, worsen or new problems develop. The patient verbalized understanding   Physical Exam at Discharge:  BP 107/72   Pulse 85   Temp 99.2 F (37.3 C) (Oral)   Resp 18   Ht 5' (1.524 m)   Wt 132 lb (59.9 kg)   SpO2 100%   BMI 25.78 kg/m    neral Appearance:  Alert, cooperative, no distress, appears stated age  Head:  Normocephalic, without obvious abnormality, atraumatic  Eyes:  PERRL, EOM's intact,scleral icterus   Back:  Symmetric, no curvature, ROM normal, no CVA  tenderness  Lungs:  Clear to auscultation bilaterally, respirations unlabored  Chest Wall:  No tenderness or deformity  Heart:  Regular rate and rhythm, S1 and S2 normal, no murmur, rub or gallop  Abdomen:  Soft, non-tender, bowel sounds active all four quadrants,  no masses, no organomegaly  Extremities: Extremities normal, atraumatic, no cyanosis or edema  Pulses: 2+ and symmetric all extremities  Skin: Skin color, texture, turgor normal, no rashes or lesions   Neurologic: Normal strength    Disposition at Discharge: 01-Home or Self Care  Discharge Orders: -Continue to hydrate and take prescribed home medications as ordered. -Resume all home medications. -Keep upcoming appointment  -The patient was given clear instructions to go to ER or return to medical center if symptoms do not improve, worsen or new problems develop. The patient verbalized understanding.  Condition at Discharge:   Stable  Time spent on Discharge:  Greater than 20 minutes.  Signed: Joaquin CourtsKimberly Rether Rison 10/02/2016, 2:43 PM

## 2016-10-02 NOTE — Progress Notes (Signed)
Pt discharged to home; discharge instructions explained, given, and signed; all questions answered; PICC line flushed; pt alert, oriented, and ambulatory; no complications noted

## 2016-10-02 NOTE — Discharge Instructions (Signed)
Sickle Cell Anemia, Adult °Sickle cell anemia is a condition where your red blood cells are shaped like sickles. Red blood cells carry oxygen through the body. Sickle-shaped red blood cells do not live as long as normal red blood cells. They also clump together and block blood from flowing through the blood vessels. These things prevent the body from getting enough oxygen. Sickle cell anemia causes organ damage and pain. It also increases the risk of infection. °Follow these instructions at home: °· Drink enough fluid to keep your pee (urine) clear or pale yellow. Drink more in hot weather and during exercise. °· Do not smoke. Smoking lowers oxygen levels in the blood. °· Only take over-the-counter or prescription medicines as told by your doctor. °· Take antibiotic medicines as told by your doctor. Make sure you finish them even if you start to feel better. °· Take supplements as told by your doctor. °· Consider wearing a medical alert bracelet. This tells anyone caring for you in an emergency of your condition. °· When traveling, keep your medical information, doctors' names, and the medicines you take with you at all times. °· If you have a fever, do not take fever medicines right away. This could cover up a problem. Tell your doctor. °· Keep all follow-up visits with your doctor. Sickle cell anemia requires regular medical care. °Contact a doctor if: °You have a fever. °Get help right away if: °· You feel dizzy or faint. °· You have new belly (abdominal) pain, especially on the left side near the stomach area. °· You have a lasting, often uncomfortable and painful erection of the penis (priapism). If it is not treated right away, you will become unable to have sex (impotence). °· You have numbness in your arms or legs or you have a hard time moving them. °· You have a hard time talking. °· You have a fever or lasting symptoms for more than 2-3 days. °· You have a fever and your symptoms suddenly get  worse. °· You have signs or symptoms of infection. These include: °? Chills. °? Being more tired than normal (lethargy). °? Irritability. °? Poor eating. °? Throwing up (vomiting). °· You have pain that is not helped with medicine. °· You have shortness of breath. °· You have pain in your chest. °· You are coughing up pus-like or bloody mucus. °· You have a stiff neck. °· Your feet or hands swell or have pain. °· Your belly looks bloated. °· Your joints hurt. °This information is not intended to replace advice given to you by your health care provider. Make sure you discuss any questions you have with your health care provider. °Document Released: 12/07/2012 Document Revised: 07/25/2015 Document Reviewed: 09/28/2012 °Elsevier Interactive Patient Education © 2017 [employer] ° °

## 2016-10-02 NOTE — H&P (Signed)
[facility] History and Physical   Date: 10/02/2016  Patient name: Brittany Foster Medical record number: [id number] Date of birth: 09/01/1985 Age: 31 y.o. Gender: female PCP: Bing NeighborsHarris, Nadyne Gariepy S, FNP  Attending physician: Quentin AngstJegede, Olugbemiga E, MD  Chief Complaint:Pain   History of Present Illness: Brittany CassisMiranda Payneis a 31y.o.femalewith a diagnosis of Sickle Cell Anemia presents today with a complaint of low back, bilateral arm and bilateral leg pain over the last several days. Brittany PuntMiranda has visited the ED and been admitted to the day infusion center twice this week  for similar type pain, and received pain management then discharged. Brittany Foster reports her pain today is consistent with previous sickle cell pain crisis this week. Her current pain intensity 10/10 and she characterizes her pain as aching and throbbing. She reports one episode vomiting this morning after taking 1 Percocet.Brittany Foster reports that she completed all of her pain medication during the night as her pain intensity would not improve. Patient denies headache, fever, shortness of breath, chest pain, dysuria, nausea, vomiting, or diarrhea. Brittany PuntMiranda is being admitted to the day infusion center for extended observation. Meds: Prescriptions Prior to Admission  Medication Sig Dispense Refill Last Dose  . ARIPiprazole (ABILIFY) 10 MG tablet Take 10 mg by mouth daily  3 Past Week at Unknown time  . DULoxetine (CYMBALTA) 60 MG capsule TAKE 1 CAPSULE BY MOUTH DAILY. 30 capsule 3 Past Week at Unknown time  . folic acid (FOLVITE) 1 MG tablet Take 1 tablet (1 mg total) by mouth daily. 90 tablet 3 Past Week at Unknown time  . hydroxyurea (HYDREA) 500 MG capsule TAKE 2 CAPSULES BY MOUTH DAILY. MAY TAKE WITH FOOD TO MINIMIZE GI SIDE EFFECTS. (Patient taking differently: Take 500 mg by mouth daily. ) 180 capsule 3 Past Week at Unknown time  . oxyCODONE-acetaminophen (PERCOCET) 10-325 MG tablet Take 1 tablet by mouth every 6 (six) hours as  needed for pain. for pain (Patient taking differently: Take 1 tablet by mouth every 4 (four) hours as needed for pain. for pain) 30 tablet 0 10/01/2016 at Unknown time  . Topiramate ER 100 MG CP24 Take 200 mg by mouth at bedtime. 60 capsule 3 Past Week at Unknown time  . medroxyPROGESTERone (DEPO-PROVERA) 150 MG/ML injection Inject 150 mg into the muscle every 3 (three) months.    More than a month at Unknown time    Allergies: Ultram [tramadol]; Zofran [ondansetron hcl]; Buprenorphine hcl; Fentanyl; Morphine and related; and Tape Past Medical History:  Diagnosis Date  . Anemia   . Depression, major, recurrent (HCC)   . Migraines   . Sickle cell anemia (HCC)    Past Surgical History:  Procedure Laterality Date  . CESAREAN SECTION    . CHOLECYSTECTOMY  2000  . IR GENERIC HISTORICAL  10/08/2015   IR US GUIDE VASC ACCESS RIGHT 10/08/2015 Simonne ComeJohn Watts, MD [facility]  . IR GENERIC HISTORICAL  10/08/2015   IR FLUORO GUIDE CV LINE RIGHT 10/08/2015 Simonne ComeJohn Watts, MD [facility]  . MULTIPLE TOOTH EXTRACTIONS N/A   . port a cath placement Right    about 6-7 years ago  . removal of porta cath Right 09/11/15  . TUBAL LIGATION     Family History  Problem Relation Age of Onset  . Sickle cell trait Father   . Sickle cell trait Mother   . Sickle cell anemia Other    Social History   Social History  . Marital status: Single    Spouse name: N/A  .  Number of children: N/A  . Years of education: N/A   Occupational History  . None    Social History Main Topics  . Smoking status: Current Some Day Smoker    Packs/day: 1.00  . Smokeless tobacco: Current User  . Alcohol use No  . Drug use: No  . Sexual activity: Yes    Partners: Male    Birth control/ protection: Injection     Comment: 1 new partner recently, no concern about STI, uses condoms   Other Topics Concern  . Not on file   Social History Narrative   261 year old daughter   Review of Systems: A comprehensive review of systems was  negative  Respiratory: Negative  Cardiovascular: Negative  Gastrointestinal: Negative  Musculoskeletal: positive pain    Physical Exam: Blood pressure 107/72, pulse 85, temperature 99.2 F (37.3 C), temperature source Oral, resp. rate 18, height 5' (1.524 m), weight 132 lb (59.9 kg), SpO2 100 %. General Appearance:  Alert, cooperative, no distress, appears stated age  Head:  Normocephalic, without obvious abnormality, atraumatic  Eyes:  PERRL, EOM's intact,scleral icterus   Back:  Symmetric, no curvature, ROM normal, no CVA tenderness  Lungs:  Clear to auscultation bilaterally, respirations unlabored  Chest Wall:  No tenderness or deformity  Heart:  Regular rate and rhythm, S1 and S2 normal, no murmur, rub or gallop  Abdomen:  Soft, non-tender, bowel sounds active all four quadrants,  no masses, no organomegaly  Extremities: Extremities normal, atraumatic, no cyanosis or edema  Pulses: 2+ and symmetric all extremities  Skin: Skin color, texture, turgor normal, no rashes or lesions  Neurologic: Normal strength       Lab results: Results for orders placed or performed during the hospital encounter of 10/01/16  CBC with Differential/Platelet  Result Value Ref Range   WBC 12.1 (H) 4.0 - 10.5 K/uL   RBC 2.56 (L) 3.87 - 5.11 MIL/uL   Hemoglobin 8.4 (L) 12.0 - 15.0 g/dL   HCT 40.923.8 (L) 81.136.0 - 91.446.0 %   MCV 93.0 78.0 - 100.0 fL   MCH 32.8 26.0 - 34.0 pg   MCHC 35.3 30.0 - 36.0 g/dL   RDW 78.220.9 (H) 95.611.5 - 21.315.5 %   Platelets 390 150 - 400 K/uL   Neutrophils Relative % 68 %   Neutro Abs 8.1 (H) 1.7 - 7.7 K/uL   Lymphocytes Relative 26 %   Lymphs Abs 3.2 0.7 - 4.0 K/uL   Monocytes Relative 6 %   Monocytes Absolute 0.8 0.1 - 1.0 K/uL   Eosinophils Relative 0 %   Eosinophils Absolute 0.0 0.0 - 0.7 K/uL   Basophils Relative 0 %   Basophils Absolute 0.1 0.0 - 0.1 K/uL    Imaging results:  n/a   Assessment & Plan:  Patient will be admitted to  the day infusion center for extended observation  Start IV D5.45 for cellular rehydration at 125/hr  IV Toradol 30 mg   Administer hydromorphone 2 mg IV.Will evaluate the necessity of administration of additional doses.  Patient will be re-evaluated for pain intensity in the context of function and relationship to baseline as care progresses.  If no significant pain relief, will transfer patient to inpatient services for a higher level of care.     Joaquin CourtsKimberly Elva Mauro 10/02/2016, 2:13 PM

## 2016-10-08 ENCOUNTER — Telehealth (HOSPITAL_COMMUNITY): Payer: Self-pay | Admitting: *Deleted

## 2016-10-08 ENCOUNTER — Non-Acute Institutional Stay (HOSPITAL_COMMUNITY)
Admission: AD | Admit: 2016-10-08 | Discharge: 2016-10-08 | Disposition: A | Discharge status: Home / Self Care | Payer: Medicaid Other | Source: Ambulatory Visit | Attending: Internal Medicine | Admitting: Internal Medicine

## 2016-10-08 ENCOUNTER — Encounter (HOSPITAL_COMMUNITY): Payer: Self-pay | Admitting: *Deleted

## 2016-10-08 DIAGNOSIS — R82998 Other abnormal findings in urine: Secondary | ICD-10-CM | POA: Diagnosis present

## 2016-10-08 DIAGNOSIS — R8299 Other abnormal findings in urine: Secondary | ICD-10-CM

## 2016-10-08 DIAGNOSIS — Z79899 Other long term (current) drug therapy: Secondary | ICD-10-CM | POA: Diagnosis not present

## 2016-10-08 DIAGNOSIS — D5700 Hb-SS disease with crisis, unspecified: Secondary | ICD-10-CM | POA: Diagnosis not present

## 2016-10-08 DIAGNOSIS — E876 Hypokalemia: Secondary | ICD-10-CM | POA: Diagnosis not present

## 2016-10-08 DIAGNOSIS — F329 Major depressive disorder, single episode, unspecified: Secondary | ICD-10-CM | POA: Insufficient documentation

## 2016-10-08 DIAGNOSIS — F17210 Nicotine dependence, cigarettes, uncomplicated: Secondary | ICD-10-CM | POA: Diagnosis not present

## 2016-10-08 LAB — CBC WITH DIFFERENTIAL/PLATELET
BASOS ABS: 0.1 10*3/uL (ref 0.0–0.1)
BASOS PCT: 1 %
EOS PCT: 2 %
Eosinophils Absolute: 0.3 10*3/uL (ref 0.0–0.7)
HEMATOCRIT: 21.1 % — AB (ref 36.0–46.0)
HEMOGLOBIN: 7.8 g/dL — AB (ref 12.0–15.0)
LYMPHS PCT: 31 %
Lymphs Abs: 4.6 10*3/uL — ABNORMAL HIGH (ref 0.7–4.0)
MCH: 34.5 pg — ABNORMAL HIGH (ref 26.0–34.0)
MCHC: 37 g/dL — ABNORMAL HIGH (ref 30.0–36.0)
MCV: 93.4 fL (ref 78.0–100.0)
MONOS PCT: 8 %
Monocytes Absolute: 1.2 10*3/uL — ABNORMAL HIGH (ref 0.1–1.0)
NEUTROS PCT: 58 %
Neutro Abs: 8.7 10*3/uL — ABNORMAL HIGH (ref 1.7–7.7)
PLATELETS: 276 10*3/uL (ref 150–400)
RBC: 2.26 MIL/uL — ABNORMAL LOW (ref 3.87–5.11)
RDW: 21.1 % — ABNORMAL HIGH (ref 11.5–15.5)
WBC: 14.9 10*3/uL — ABNORMAL HIGH (ref 4.0–10.5)
nRBC: 1 /100 WBC — ABNORMAL HIGH

## 2016-10-08 LAB — RETICULOCYTES
RBC.: 2.26 MIL/uL — ABNORMAL LOW (ref 3.87–5.11)
RETIC CT PCT: 17 % — AB (ref 0.4–3.1)
Retic Count, Absolute: 384.2 10*3/uL — ABNORMAL HIGH (ref 19.0–186.0)

## 2016-10-08 LAB — COMPREHENSIVE METABOLIC PANEL
ALK PHOS: 94 U/L (ref 38–126)
ALT: 33 U/L (ref 14–54)
AST: 57 U/L — AB (ref 15–41)
Albumin: 4.3 g/dL (ref 3.5–5.0)
Anion gap: 7 (ref 5–15)
BILIRUBIN TOTAL: 3.4 mg/dL — AB (ref 0.3–1.2)
BUN: 9 mg/dL (ref 6–20)
CHLORIDE: 111 mmol/L (ref 101–111)
CO2: 22 mmol/L (ref 22–32)
CREATININE: 0.61 mg/dL (ref 0.44–1.00)
Calcium: 8.8 mg/dL — ABNORMAL LOW (ref 8.9–10.3)
GFR calc Af Amer: >60 mL/min (ref >60)
Glucose, Bld: 117 mg/dL — ABNORMAL HIGH (ref 65–99)
Potassium: 3.4 mmol/L — ABNORMAL LOW (ref 3.5–5.1)
Sodium: 140 mmol/L (ref 135–145)
Total Protein: 7.9 g/dL (ref 6.5–8.1)

## 2016-10-08 LAB — URINALYSIS, COMPLETE (UACMP) WITH MICROSCOPIC
Bilirubin Urine: NEGATIVE
GLUCOSE, UA: NEGATIVE mg/dL
KETONES UR: NEGATIVE mg/dL
NITRITE: NEGATIVE
PROTEIN: NEGATIVE mg/dL
Specific Gravity, Urine: 1.015 (ref 1.005–1.030)
pH: 5 (ref 5.0–8.0)

## 2016-10-08 LAB — PREGNANCY, URINE: Preg Test, Ur: NEGATIVE

## 2016-10-08 MED ORDER — HYDROMORPHONE HCL 2 MG/ML IJ SOLN
2.0000 mg | Freq: Once | INTRAMUSCULAR | Status: AC
  Administered 2016-10-08: 2 mg via INTRAVENOUS
  Filled 2016-10-08: qty 1

## 2016-10-08 MED ORDER — SODIUM CHLORIDE 0.9% FLUSH
10.0000 mL | INTRAVENOUS | Status: AC | PRN
  Administered 2016-10-08: 10 mL

## 2016-10-08 MED ORDER — DEXTROSE-NACL 5-0.45 % IV SOLN
INTRAVENOUS | Status: DC
  Administered 2016-10-08: 11:00:00 via INTRAVENOUS

## 2016-10-08 MED ORDER — HYDROMORPHONE HCL 4 MG PO TABS
2.0000 mg | ORAL_TABLET | Freq: Once | ORAL | Status: AC
Start: 2016-10-08 — End: 2016-10-08
  Administered 2016-10-08: 2 mg via ORAL
  Filled 2016-10-08: qty 1

## 2016-10-08 MED ORDER — PROMETHAZINE HCL 25 MG PO TABS
25.0000 mg | ORAL_TABLET | Freq: Once | ORAL | Status: AC
  Administered 2016-10-08: 25 mg via ORAL
  Filled 2016-10-08: qty 1

## 2016-10-08 MED ORDER — POTASSIUM CHLORIDE CRYS ER 20 MEQ PO TBCR
20.0000 meq | EXTENDED_RELEASE_TABLET | Freq: Once | ORAL | Status: AC
  Administered 2016-10-08: 20 meq via ORAL
  Filled 2016-10-08: qty 1

## 2016-10-08 MED ORDER — HEPARIN SOD (PORK) LOCK FLUSH 100 UNIT/ML IV SOLN
250.0000 [IU] | INTRAVENOUS | Status: AC | PRN
  Administered 2016-10-08: 250 [IU]
  Filled 2016-10-08: qty 5

## 2016-10-08 MED ORDER — HYDROMORPHONE HCL 2 MG/ML IJ SOLN: 2.0000 mg | Freq: Once | INTRAMUSCULAR | Status: DC

## 2016-10-08 MED ORDER — KETOROLAC TROMETHAMINE 30 MG/ML IJ SOLN
30.0000 mg | Freq: Once | INTRAMUSCULAR | Status: AC
  Administered 2016-10-08: 30 mg via INTRAVENOUS
  Filled 2016-10-08: qty 1

## 2016-10-08 MED ORDER — DIPHENHYDRAMINE HCL 25 MG PO CAPS
25.0000 mg | ORAL_CAPSULE | Freq: Once | ORAL | Status: AC
  Administered 2016-10-08: 25 mg via ORAL
  Filled 2016-10-08: qty 1

## 2016-10-08 NOTE — Telephone Encounter (Signed)
[facility]. Pt states she has a 7/10 pain # and her pain is in her legs, back and hips. She last took her Oxy at 5am. She denies fever, chest pain, vomiting, diarrhea or abdominal pain. She states some nausea. Will check with the provider and give her a call back. Pt voiced understanding.

## 2016-10-08 NOTE — H&P (Signed)
[facility] History and Physical   Date: 10/08/2016  Patient name: Brittany Foster Medical record number: [id number] Date of birth: [birth year] Age: 31 y.o. Gender: female PCP: Bing Neighbors, FNP  Attending physician: Quentin Angst, MD  Chief Complaint: Generalized pain  History of Present Illness: Brittany Foster, a 31 year old female with a history of sickle cell anemia, HbSS presents complaining of generalized pain. Brittany Foster has had increased hospitalizations and emergency room visits over the past several months.  She says that pain has been increased over the past several days. She says that she was admitted to [facility] in [place], [place] and she had to leave hospital due to an appointment that her daughter could not miss. She says that pain intensity is 8/10, constant and throbbing. She last had Percocet 10-325 this am without sustained relief. Brittany Foster endorses nausea.  She denies headache, fatigue, paresthesias, chest pain, dysuria, nausea, vomiting, or diarrhea.   Meds: Prescriptions Prior to Admission  Medication Sig Dispense Refill Last Dose  . ARIPiprazole (ABILIFY) 10 MG tablet Take 10 mg by mouth daily  3 Past Week at Unknown time  . DULoxetine (CYMBALTA) 60 MG capsule TAKE 1 CAPSULE BY MOUTH DAILY. 30 capsule 3 Past Week at Unknown time  . folic acid (FOLVITE) 1 MG tablet Take 1 tablet (1 mg total) by mouth daily. 90 tablet 3 Past Week at Unknown time  . hydroxyurea (HYDREA) 500 MG capsule TAKE 2 CAPSULES BY MOUTH DAILY. MAY TAKE WITH FOOD TO MINIMIZE GI SIDE EFFECTS. (Patient taking differently: Take 500 mg by mouth daily. ) 180 capsule 3 Past Week at Unknown time  . oxyCODONE-acetaminophen (PERCOCET) 10-325 MG tablet Take 1 tablet by mouth every 6 (six) hours as needed for pain. for pain (Patient taking differently: Take 1 tablet by mouth every 4 (four) hours as needed for pain. for pain) 30 tablet 0 10/08/2016 at Unknown time  . Topiramate ER  100 MG CP24 Take 200 mg by mouth at bedtime. 60 capsule 3 Past Week at Unknown time  . medroxyPROGESTERone (DEPO-PROVERA) 150 MG/ML injection Inject 150 mg into the muscle every 3 (three) months.    More than a month at Unknown time    Allergies: Ultram [tramadol]; Zofran [ondansetron hcl]; Buprenorphine hcl; Fentanyl; Morphine and related; and Tape Past Medical History:  Diagnosis Date  . Anemia   . Depression, major, recurrent (HCC)   . Migraines   . Sickle cell anemia (HCC)    Past Surgical History:  Procedure Laterality Date  . CESAREAN SECTION    . CHOLECYSTECTOMY  2000  . IR GENERIC HISTORICAL  10/08/2015   IR US GUIDE VASC ACCESS RIGHT 10/08/2015 Simonne Come, MD [facility]  . IR GENERIC HISTORICAL  10/08/2015   IR FLUORO GUIDE CV LINE RIGHT 10/08/2015 Simonne Come, MD [facility]  . MULTIPLE TOOTH EXTRACTIONS N/A   . port a cath placement Right    about 6-7 years ago  . removal of porta cath Right 09/11/15  . TUBAL LIGATION     Family History  Problem Relation Age of Onset  . Sickle cell trait Father   . Sickle cell trait Mother   . Sickle cell anemia Other    Social History   Social History  . Marital status: Single    Spouse name: N/A  . Number of children: N/A  . Years of education: N/A   Occupational History  . None    Social History Main Topics  .  Smoking status: Current Some Day Smoker    Packs/day: 1.00  . Smokeless tobacco: Current User  . Alcohol use No  . Drug use: No  . Sexual activity: Yes    Partners: Male    Birth control/ protection: Injection     Comment: 1 new partner recently, no concern about STI, uses condoms   Other Topics Concern  . Not on file   Social History Narrative   36 year old daughter    Review of Systems: Review of Systems  Constitutional: Negative for chills, fever and malaise/fatigue.  HENT: Negative.   Respiratory: Negative.   Cardiovascular: Negative for chest pain and palpitations.  Gastrointestinal: Positive  for nausea. Negative for heartburn.  Genitourinary: Negative.   Musculoskeletal: Positive for back pain, joint pain and myalgias.  Skin: Negative.   Neurological: Negative.   Endo/Heme/Allergies: Negative.   Psychiatric/Behavioral: Negative.     Physical Exam: Blood pressure 114/73, pulse (!) 112, temperature 99 F (37.2 C), temperature source Oral, resp. rate 16, height 5' (1.524 m), weight 132 lb (59.9 kg), SpO2 96 %. BP 114/73 (BP Location: Right Arm, Patient Position: Sitting)   Pulse (!) 112   Temp 99 F (37.2 C) (Oral)   Resp 16   Ht 5' (1.524 m)   Wt 132 lb (59.9 kg)   SpO2 96%   BMI 25.78 kg/m   General Appearance:    Alert, cooperative, no distress, appears stated age  Head:    Normocephalic, without obvious abnormality, atraumatic  Eyes:    PERRL, conjunctiva/corneas clear, EOM's intact, fundi    benign, both eyes  Ears:    Normal TM's and external ear canals, both ears  Nose:   Nares normal, septum midline, mucosa normal, no drainage    or sinus tenderness  Throat:   Lips, mucosa, and tongue normal; teeth and gums normal  Neck:   Supple, symmetrical, trachea midline, no adenopathy;    thyroid:  no enlargement/tenderness/nodules; no carotid   bruit or JVD  Back:     Symmetric, no curvature, ROM normal, no CVA tenderness  Lungs:     Clear to auscultation bilaterally, respirations unlabored  Chest Wall:    No tenderness or deformity. PICC insertion to right chest, clean, dry, and intact.    Heart:    Regular rate and rhythm, S1 and S2 normal, no murmur, rub   or gallop  Abdomen:     Soft, non-tender, bowel sounds active all four quadrants,    no masses, no organomegaly  Extremities:   Extremities normal, atraumatic, no cyanosis or edema  Pulses:   2+ and symmetric all extremities  Skin:   Skin color, texture, turgor normal, no rashes or lesions  Lymph nodes:   Cervical, supraclavicular, and axillary nodes normal  Neurologic:   CNII-XII intact, normal strength,  sensation and reflexes    throughout    Lab results: No results found for this or any previous visit (from the past 24 hour(s)).  Imaging results:  No results found.   Assessment & Plan:  Patient will be admitted to the day infusion center for extended observation  Start IV D5.45 for cellular rehydration at 125/hr  Start Toradol 30 mg IV times one for inflammation.  Benadryl 25 mg po for itching  Promethazine 25 mg po for nausea  Start Dilaudid IV per clinician assisted doses  Patient will be re-evaluated for pain intensity in the context of function and relationship to baseline as care progresses.  If no significant  pain relief, will transfer patient to inpatient services for a higher level of care.   Will review CMP, urinalysis, urine pregnancy, reticulocytes, and CBC w/differential   Nickalos Petersen M 10/08/2016, 10:47 AM

## 2016-10-08 NOTE — Progress Notes (Signed)
[facility]. Pt stated her pain was in her legs, arms and back. She was treated with rest, IV fluids, IV push Dilaudid and po Dilaudid. Pt's pain # was 8/10 on admission and down to 5/10 at discharge. She received d/c instructions with verbal understanding. She was alert, oriented and ambulatory at discharge. D/C with family

## 2016-10-08 NOTE — Discharge Instructions (Signed)
Sickle Cell Anemia, Adult °Sickle cell anemia is a condition where your red blood cells are shaped like sickles. Red blood cells carry oxygen through the body. Sickle-shaped red blood cells do not live as long as normal red blood cells. They also clump together and block blood from flowing through the blood vessels. These things prevent the body from getting enough oxygen. Sickle cell anemia causes organ damage and pain. It also increases the risk of infection. °Follow these instructions at home: °· Drink enough fluid to keep your pee (urine) clear or pale yellow. Drink more in hot weather and during exercise. °· Do not smoke. Smoking lowers oxygen levels in the blood. °· Only take over-the-counter or prescription medicines as told by your doctor. °· Take antibiotic medicines as told by your doctor. Make sure you finish them even if you start to feel better. °· Take supplements as told by your doctor. °· Consider wearing a medical alert bracelet. This tells anyone caring for you in an emergency of your condition. °· When traveling, keep your medical information, doctors' names, and the medicines you take with you at all times. °· If you have a fever, do not take fever medicines right away. This could cover up a problem. Tell your doctor. °· Keep all follow-up visits with your doctor. Sickle cell anemia requires regular medical care. °Contact a doctor if: °You have a fever. °Get help right away if: °· You feel dizzy or faint. °· You have new belly (abdominal) pain, especially on the left side near the stomach area. °· You have a lasting, often uncomfortable and painful erection of the penis (priapism). If it is not treated right away, you will become unable to have sex (impotence). °· You have numbness in your arms or legs or you have a hard time moving them. °· You have a hard time talking. °· You have a fever or lasting symptoms for more than 2-3 days. °· You have a fever and your symptoms suddenly get  worse. °· You have signs or symptoms of infection. These include: °? Chills. °? Being more tired than normal (lethargy). °? Irritability. °? Poor eating. °? Throwing up (vomiting). °· You have pain that is not helped with medicine. °· You have shortness of breath. °· You have pain in your chest. °· You are coughing up pus-like or bloody mucus. °· You have a stiff neck. °· Your feet or hands swell or have pain. °· Your belly looks bloated. °· Your joints hurt. °This information is not intended to replace advice given to you by your health care provider. Make sure you discuss any questions you have with your health care provider. °Document Released: 12/07/2012 Document Revised: 07/25/2015 Document Reviewed: 09/28/2012 °Elsevier Interactive Patient Education © 2017 [employer] ° °

## 2016-10-08 NOTE — Discharge Summary (Signed)
[facility] Discharge Summary   Patient ID: Brittany Foster MRN: [id number] DOB/AGE: Nov 23, 1985 31 y.o.  Admit date: 10/08/2016 Discharge date: 10/08/2016  Primary Care Physician:  Bing Neighbors, FNP  Admission Diagnoses:  Active Problems:   Hypokalemia   Sickle cell crisis (HCC)   Urine leukocytes   Discharge Medications:  Allergies as of 10/08/2016      Reactions   Ultram [tramadol] Other (See Comments)   Reaction:  Seizures    Zofran [ondansetron Hcl] Nausea And Vomiting   Buprenorphine Hcl Hives, Other (See Comments)   Reaction:  Shaking    Fentanyl Hives   Morphine And Related Hives, Other (See Comments)   Reaction:  Shaking    Tape Rash      Medication List    TAKE these medications   ARIPiprazole 10 MG tablet Commonly known as:  ABILIFY Take 10 mg by mouth daily   DULoxetine 60 MG capsule Commonly known as:  CYMBALTA TAKE 1 CAPSULE BY MOUTH DAILY.   folic acid 1 MG tablet Commonly known as:  FOLVITE Take 1 tablet (1 mg total) by mouth daily.   hydroxyurea 500 MG capsule Commonly known as:  HYDREA TAKE 2 CAPSULES BY MOUTH DAILY. MAY TAKE WITH FOOD TO MINIMIZE GI SIDE EFFECTS. What changed:  how much to take  how to take this  when to take this  additional instructions   medroxyPROGESTERone 150 MG/ML injection Commonly known as:  DEPO-PROVERA Inject 150 mg into the muscle every 3 (three) months.   oxyCODONE-acetaminophen 10-325 MG tablet Commonly known as:  PERCOCET Take 1 tablet by mouth every 6 (six) hours as needed for pain. for pain What changed:  when to take this  additional instructions   Topiramate ER 100 MG Cp24 Commonly known as:  TROKENDI XR Take 200 mg by mouth at bedtime.        Consults:  None  Significant Diagnostic Studies:  No results found.   [facility] Course: Brittany Foster, a 31 year old female with a history of sickle cell anemia, HbSS presents complaining of generalized pain.  Brittany Foster has had increased hospitalizations and emergency room visits over the past several months.  She says that pain has been increased over the past several days. She says that she was admitted to [facility] in [place], [place] and she had to leave hospital due to an appointment that her daughter could not miss. She says that pain intensity is 8/10, constant and throbbing. She last had Percocet 10-325 this am without sustained relief. Ms. Esperanza endorses nausea.  She denies headache, fatigue, paresthesias, chest pain, dysuria, nausea, vomiting, or diarrhea. Patient admitted to the day infusion center for extended observation.  Reviewed labs, patient hemodynamically stable.  Large urine leukocytes present on review, will add on a culture to rule out bacteriuria  Potassium decrease at 3.4, K-dur 20 mEQ times 1.   Pain management:  D5.45 @ 125 ml/hr for cellular rehydration Toradol 30 mg IV times one for inflammation Dilaudid 2 mg IV per clinician assisted doses for a total of 8 mg.  Dilaudid 2 mg po.  Pain intensity decreased from 8/10 to 5/10 prior to discharge Patient alert, oriented, and ambulation  Discharge instructions:  Patient will discharge home with family in stable condition Will follow urine culture to rule out bacteruria Resume all home medications Patient will discharge with PICC line in place. Patient will need to follow up with primary provider to pull PICC line.  Patient was not forthcoming on while PICC line was left in. She had it placed at [facility] in [place], [place]. Dressing clean, dry, and intact. No visible signs of infection  Discussed the importance of drinking 64 ounces of water daily. The Importance of Water. To help prevent pain crises, it is important to drink plenty of water throughout the day. This is because dehydration of red blood cells may lead to the sickling process.    The patient was given clear instructions to go to ER or return to  medical center if symptoms do not improve, worsen or new problems develop. The patient verbalized understanding.        Physical Exam at Discharge:  BP 114/73 (BP Location: Right Arm, Patient Position: Sitting)   Pulse (!) 112   Temp 99 F (37.2 C) (Oral)   Resp 16   Ht 5' (1.524 m)   Wt 132 lb (59.9 kg)   SpO2 96%   BMI 25.78 kg/m    General Appearance:    Alert, cooperative, no distress, appears stated age  Head:    Normocephalic, without obvious abnormality, atraumatic  Throat:   Lips, mucosa, and tongue normal; partially edentulous  Neck:   Supple, symmetrical, trachea midline, no adenopathy;    thyroid:  no enlargement/tenderness/nodules; no carotid   bruit or JVD  Back:     Symmetric, no curvature, ROM normal, no CVA tenderness  Lungs:     Clear to auscultation bilaterally, respirations unlabored  Chest Wall:    No tenderness or deformity   Heart:    Regular rate and rhythm, S1 and S2 normal, no murmur, rub   or gallop   Disposition at Discharge: 01-Home or Self Care  Discharge Orders: Discharge Instructions    Discharge patient    Complete by:  As directed    Discharge disposition:  01-Home or Self Care   Discharge patient date:  10/08/2016      Condition at Discharge:   Stable  Time spent on Discharge:  Greater than 30 minutes.  Signed: Jemia Fata M 10/08/2016, 3:21 PM

## 2016-10-08 NOTE — Telephone Encounter (Signed)
After speaking with the provider, C. [name], NP, pt was told that she could come in for treatment. Pt voiced understanding and that she could get here in about an hour.

## 2016-10-09 ENCOUNTER — Ambulatory Visit: Payer: Self-pay | Admitting: Family Medicine

## 2016-10-10 LAB — URINE CULTURE: CULTURE: NO GROWTH

## 2016-10-13 ENCOUNTER — Telehealth (HOSPITAL_COMMUNITY): Payer: Self-pay | Admitting: Hematology

## 2016-10-13 NOTE — Telephone Encounter (Signed)
Patient C/O pain to hips, legs, and back.  Patient rates pain 7/10 on pain scale.  Denies fever, chest pain or shortness of breath.  Patient states she has taken oxycodone and advil with no relief.  Patient is asking if she can come in the morning because she doesn't have a ride today. / Explained to patient that she would need to call back in the morning to be triaged.  Advised patient to continue her home medications, increase fluids and rest.  If pain is not controlled, she can go to ED.  Patient verbalizes understanding. /

## 2016-10-14 ENCOUNTER — Telehealth (HOSPITAL_COMMUNITY): Payer: Self-pay | Admitting: *Deleted

## 2016-10-14 ENCOUNTER — Non-Acute Institutional Stay (HOSPITAL_COMMUNITY)
Admission: AD | Admit: 2016-10-14 | Discharge: 2016-10-14 | Disposition: A | Discharge status: Home / Self Care | Payer: Medicaid Other | Source: Ambulatory Visit | Attending: Internal Medicine | Admitting: Internal Medicine

## 2016-10-14 ENCOUNTER — Encounter (HOSPITAL_COMMUNITY): Payer: Self-pay | Admitting: Hematology

## 2016-10-14 DIAGNOSIS — Z832 Family history of diseases of the blood and blood-forming organs and certain disorders involving the immune mechanism: Secondary | ICD-10-CM | POA: Insufficient documentation

## 2016-10-14 DIAGNOSIS — Z79899 Other long term (current) drug therapy: Secondary | ICD-10-CM | POA: Insufficient documentation

## 2016-10-14 DIAGNOSIS — Z888 Allergy status to other drugs, medicaments and biological substances status: Secondary | ICD-10-CM | POA: Diagnosis not present

## 2016-10-14 DIAGNOSIS — Z9049 Acquired absence of other specified parts of digestive tract: Secondary | ICD-10-CM | POA: Diagnosis not present

## 2016-10-14 DIAGNOSIS — D5700 Hb-SS disease with crisis, unspecified: Secondary | ICD-10-CM | POA: Diagnosis not present

## 2016-10-14 DIAGNOSIS — F17200 Nicotine dependence, unspecified, uncomplicated: Secondary | ICD-10-CM | POA: Diagnosis not present

## 2016-10-14 DIAGNOSIS — Z885 Allergy status to narcotic agent status: Secondary | ICD-10-CM | POA: Insufficient documentation

## 2016-10-14 DIAGNOSIS — Z91048 Other nonmedicinal substance allergy status: Secondary | ICD-10-CM | POA: Insufficient documentation

## 2016-10-14 LAB — CBC WITH DIFFERENTIAL/PLATELET
BASOS ABS: 0.1 10*3/uL (ref 0.0–0.1)
Basophils Relative: 0 %
EOS PCT: 1 %
Eosinophils Absolute: 0.2 10*3/uL (ref 0.0–0.7)
HEMATOCRIT: 24.5 % — AB (ref 36.0–46.0)
Hemoglobin: 8.6 g/dL — ABNORMAL LOW (ref 12.0–15.0)
LYMPHS PCT: 26 %
Lymphs Abs: 3.8 10*3/uL (ref 0.7–4.0)
MCH: 33.1 pg (ref 26.0–34.0)
MCHC: 35.1 g/dL (ref 30.0–36.0)
MCV: 94.2 fL (ref 78.0–100.0)
MONO ABS: 1.2 10*3/uL — AB (ref 0.1–1.0)
MONOS PCT: 8 %
NEUTROS ABS: 9.5 10*3/uL — AB (ref 1.7–7.7)
Neutrophils Relative %: 65 %
PLATELETS: 323 10*3/uL (ref 150–400)
RBC: 2.6 MIL/uL — ABNORMAL LOW (ref 3.87–5.11)
RDW: 20.2 % — AB (ref 11.5–15.5)
WBC: 14.6 10*3/uL — ABNORMAL HIGH (ref 4.0–10.5)

## 2016-10-14 LAB — COMPREHENSIVE METABOLIC PANEL
ALT: 24 U/L (ref 14–54)
ANION GAP: 9 (ref 5–15)
AST: 46 U/L — AB (ref 15–41)
Albumin: 4.5 g/dL (ref 3.5–5.0)
Alkaline Phosphatase: 87 U/L (ref 38–126)
BILIRUBIN TOTAL: 2.5 mg/dL — AB (ref 0.3–1.2)
BUN: 8 mg/dL (ref 6–20)
CHLORIDE: 114 mmol/L — AB (ref 101–111)
CO2: 18 mmol/L — ABNORMAL LOW (ref 22–32)
Calcium: 9 mg/dL (ref 8.9–10.3)
Creatinine, Ser: 0.53 mg/dL (ref 0.44–1.00)
Glucose, Bld: 112 mg/dL — ABNORMAL HIGH (ref 65–99)
Potassium: 3.3 mmol/L — ABNORMAL LOW (ref 3.5–5.1)
Sodium: 141 mmol/L (ref 135–145)
TOTAL PROTEIN: 7.9 g/dL (ref 6.5–8.1)

## 2016-10-14 LAB — RETICULOCYTES
RBC.: 2.6 MIL/uL — AB (ref 3.87–5.11)
RETIC COUNT ABSOLUTE: 366.6 10*3/uL — AB (ref 19.0–186.0)
RETIC CT PCT: 14.1 % — AB (ref 0.4–3.1)

## 2016-10-14 MED ORDER — SODIUM CHLORIDE 0.9% FLUSH: 10.0000 mL | INTRAVENOUS | Status: DC | PRN

## 2016-10-14 MED ORDER — SODIUM CHLORIDE 0.9% FLUSH
10.0000 mL | INTRAVENOUS | Status: AC | PRN
  Administered 2016-10-14: 10 mL

## 2016-10-14 MED ORDER — NALOXONE HCL 0.4 MG/ML IJ SOLN: 0.4000 mg | INTRAMUSCULAR | Status: DC | PRN

## 2016-10-14 MED ORDER — HEPARIN SOD (PORK) LOCK FLUSH 100 UNIT/ML IV SOLN
250.0000 [IU] | INTRAVENOUS | Status: DC | PRN
  Filled 2016-10-14: qty 5

## 2016-10-14 MED ORDER — DEXTROSE-NACL 5-0.45 % IV SOLN
INTRAVENOUS | Status: DC
  Administered 2016-10-14: 12:00:00 via INTRAVENOUS

## 2016-10-14 MED ORDER — SODIUM CHLORIDE 0.9 % IV SOLN: 25.0000 mg | INTRAVENOUS | Status: DC | PRN

## 2016-10-14 MED ORDER — ONDANSETRON HCL 4 MG/2ML IJ SOLN: 4.0000 mg | Freq: Four times a day (QID) | INTRAMUSCULAR | Status: DC | PRN

## 2016-10-14 MED ORDER — HYDROMORPHONE 1 MG/ML IV SOLN
INTRAVENOUS | Status: DC
  Administered 2016-10-14: 12:00:00 via INTRAVENOUS
  Administered 2016-10-14: 13.5 mg via INTRAVENOUS
  Filled 2016-10-14: qty 25

## 2016-10-14 MED ORDER — KETOROLAC TROMETHAMINE 30 MG/ML IJ SOLN
30.0000 mg | Freq: Once | INTRAMUSCULAR | Status: AC
  Administered 2016-10-14: 30 mg via INTRAVENOUS
  Filled 2016-10-14: qty 1

## 2016-10-14 MED ORDER — HYDROMORPHONE HCL 4 MG PO TABS
4.0000 mg | ORAL_TABLET | Freq: Once | ORAL | Status: AC
  Administered 2016-10-14: 4 mg via ORAL
  Filled 2016-10-14: qty 1

## 2016-10-14 MED ORDER — HEPARIN SOD (PORK) LOCK FLUSH 100 UNIT/ML IV SOLN
250.0000 [IU] | INTRAVENOUS | Status: AC | PRN
  Administered 2016-10-14: 250 [IU]

## 2016-10-14 MED ORDER — SODIUM CHLORIDE 0.9% FLUSH: 9.0000 mL | INTRAVENOUS | Status: DC | PRN

## 2016-10-14 MED ORDER — DIPHENHYDRAMINE HCL 25 MG PO CAPS
25.0000 mg | ORAL_CAPSULE | ORAL | Status: DC | PRN
  Administered 2016-10-14: 25 mg via ORAL
  Filled 2016-10-14: qty 1

## 2016-10-14 NOTE — Discharge Summary (Signed)
[facility] Discharge Summary   Patient ID: Brittany Foster MRN: [id number] DOB/AGE: 32/03/1985 32 y.o.  Admit date: 10/14/2016 Discharge date: 10/14/2016  Primary Care Physician:  Brittany Foster  Admission Diagnoses:  Active Problems:   Sickle cell pain crisis [facility](HCC)  Discharge Medications:  Allergies as of 10/14/2016      Reactions   Ultram [tramadol] Other (See Comments)   Reaction:  Seizures    Zofran [ondansetron Hcl] Nausea And Vomiting   Buprenorphine Hcl Hives, Other (See Comments)   Reaction:  Shaking    Fentanyl Hives   Morphine And Related Hives, Other (See Comments)   Reaction:  Shaking    Tape Rash      Medication List    TAKE these medications   ARIPiprazole 10 MG tablet Commonly known as:  ABILIFY Take 10 mg by mouth daily   DULoxetine 60 MG capsule Commonly known as:  CYMBALTA TAKE 1 CAPSULE BY MOUTH DAILY.   folic acid 1 MG tablet Commonly known as:  FOLVITE Take 1 tablet (1 mg total) by mouth daily.   hydroxyurea 500 MG capsule Commonly known as:  HYDREA TAKE 2 CAPSULES BY MOUTH DAILY. MAY TAKE WITH FOOD TO MINIMIZE GI SIDE EFFECTS. What changed:  how much to take  how to take this  when to take this  additional instructions   medroxyPROGESTERone 150 MG/ML injection Commonly known as:  DEPO-PROVERA Inject 150 mg into the muscle every 3 (three) months.   oxyCODONE-acetaminophen 10-325 MG tablet Commonly known as:  PERCOCET Take 1 tablet by mouth every 6 (six) hours as needed for pain. for pain What changed:  when to take this  additional instructions   Topiramate ER 100 MG Cp24 Commonly known as:  TROKENDI XR Take 200 mg by mouth at bedtime.      Consults: N/A  Significant Diagnostic Studies:  Results for orders placed or performed during the hospital encounter of 10/14/16  CBC with Differential/Platelet  Result Value Ref Range   WBC 14.6 (H) 4.0 - 10.5 K/uL   RBC 2.60 (L) 3.87 - 5.11 MIL/uL   Hemoglobin 8.6 (L) 12.0 - 15.0 g/dL   HCT 11.924.5 (L) 14.736.0 - 82.946.0 %   MCV 94.2 78.0 - 100.0 fL   MCH 33.1 26.0 - 34.0 pg   MCHC 35.1 30.0 - 36.0 g/dL   RDW 56.220.2 (H) 13.011.5 - 86.515.5 %   Platelets 323 150 - 400 K/uL   Neutrophils Relative % 65 %   Neutro Abs 9.5 (H) 1.7 - 7.7 K/uL   Lymphocytes Relative 26 %   Lymphs Abs 3.8 0.7 - 4.0 K/uL   Monocytes Relative 8 %   Monocytes Absolute 1.2 (H) 0.1 - 1.0 K/uL   Eosinophils Relative 1 %   Eosinophils Absolute 0.2 0.0 - 0.7 K/uL   Basophils Relative 0 %   Basophils Absolute 0.1 0.0 - 0.1 K/uL  Comprehensive metabolic panel  Result Value Ref Range   Sodium 141 135 - 145 mmol/L   Potassium 3.3 (L) 3.5 - 5.1 mmol/L   Chloride 114 (H) 101 - 111 mmol/L   CO2 18 (L) 22 - 32 mmol/L   Glucose, Bld 112 (H) 65 - 99 mg/dL   BUN 8 6 - 20 mg/dL   Creatinine, Ser 7.840.53 0.44 - 1.00 mg/dL   Calcium 9.0 8.9 - 69.610.3 mg/dL   Total Protein 7.9 6.5 - 8.1 g/dL   Albumin 4.5 3.5 - 5.0 g/dL   AST 46 (H) 15 -  41 U/L   ALT 24 14 - 54 U/L   Alkaline Phosphatase 87 38 - 126 U/L   Total Bilirubin 2.5 (H) 0.3 - 1.2 mg/dL   GFR calc non Af Amer >60 >60 mL/min   GFR calc Af Amer >60 >60 mL/min   Anion gap 9 5 - 15  Reticulocytes  Result Value Ref Range   Retic Ct Pct 14.1 (H) 0.4 - 3.1 %   RBC. 2.60 (L) 3.87 - 5.11 MIL/uL   Retic Count, Absolute 366.6 (H) 19.0 - 186.0 K/uL    [facility] Course: Brittany Foster a 31y.o.femalewith a diagnosis of Sickle Cell Anemia presents today with a complaint of low back and  bilateral hip/leg pain over the last several days. Brittany Foster reports a current pain intensity of 10/10 and pain is characterized as a constant throbbing. Brittany Foster reports her pain today is consistent with previous sickle cell pain crisis this week. She attempted pain relief with one dose of home pain medication which did not improve her pain symptoms. She denies headache, fever, shortness of breath, chest pain, dysuria, nausea, vomiting, or  diarrhea. Brittany Foster was admitted to the day infusion center for extended observation and the following was her course of treatment: Intravenous D5.45 @ 125 cc/hr administer for cellular rehydration. Placed on a High Concentration PCA per weight based protocol for pain control.  Patient used a total of 13.5 with  27 demands and 27 deliveries. Patient states that pain intensity is 7/10, reduced from 10/10 on admission. Reviewed laboratory values, consistent with baseline. Patient is alert, oriented and ambulatory.    Physical Exam at Discharge: BP 109/63 (BP Location: Right Arm)   Pulse 89   Temp 98.8 F (37.1 C) (Oral)   Resp (!) 9   Ht 5' (1.524 m)   Wt 132 lb (59.9 kg)   SpO2 99%   BMI 25.78 kg/m  General Appearance:  Alert, cooperative, no distress, appears stated age  Head:  Normocephalic, without obvious abnormality, atraumatic  Eyes:  PERRL, EOM's intact,scleral icterus   Back:  Symmetric, no curvature, ROM normal, no CVA tenderness  Lungs:  Clear to auscultation bilaterally, respirations unlabored  Chest Wall:  No tenderness or deformity  Heart:  Regular rate and rhythm, S1 and S2 normal, no murmur, rub or gallop  Abdomen:  Soft, non-tender, bowel sounds active all four quadrants,  no masses, no organomegaly  Extremities: Extremities normal, atraumatic, no cyanosis or edema  Pulses: 2+ and symmetric all extremities  Skin: Skin color, texture, turgor normal, no rashes or lesions  Neurologic: Normal strength      Disposition at Discharge: 01-Home or Self Care  Discharge Orders: -Continue to hydrate and take prescribed home medications as ordered. -Resume all home medications. -Keep upcoming appointment  -The patient was given clear instructions to go to ER or return to medical center if symptoms do not improve, worsen or new problems develop. The patient verbalized understanding.  Condition at Discharge:   Stable  Time spent on Discharge:   Greater than 25 minutes.  Signed: Joaquin Courts 10/14/2016, 4:04 PM

## 2016-10-14 NOTE — H&P (Signed)
[facility] History and Physical   Date: 10/14/2016  Patient name: Brittany Foster Medical record number: [id number] Date of birth: 12/19/1985 Age: 31 y.o. Gender: female PCP: Brittany Foster  Attending physician: Brittany Foster  Chief Complaint: Hip, Leg, and Back Pain   History of Present Illness: Brittany Foster a 31y.o.femalewith a diagnosis of Sickle Cell Anemia presents today with a complaint of low back and  bilateral hip/leg pain over the last several days. Brittany Foster reports a current pain intensity of 10/10 and pain is characterized as a constant throbbing. Brittany Foster reports her pain today is consistent with previous sickle cell pain crisis this week. She attempted pain relief with one dose of home pain medication which did not improve her pain symptoms. She denies headache, fever, shortness of breath, chest pain, dysuria, nausea, vomiting, or diarrhea. Brittany Foster is being admitted to the day infusion center for extended observation.  Meds: Prescriptions Prior to Admission  Medication Sig Dispense Refill Last Dose  . ARIPiprazole (ABILIFY) 10 MG tablet Take 10 mg by mouth daily  3 10/13/2016 at Unknown time  . DULoxetine (CYMBALTA) 60 MG capsule TAKE 1 CAPSULE BY MOUTH DAILY. 30 capsule 3 10/13/2016 at Unknown time  . folic acid (FOLVITE) 1 MG tablet Take 1 tablet (1 mg total) by mouth daily. 90 tablet 3 10/13/2016 at Unknown time  . hydroxyurea (HYDREA) 500 MG capsule TAKE 2 CAPSULES BY MOUTH DAILY. MAY TAKE WITH FOOD TO MINIMIZE GI SIDE EFFECTS. (Patient taking differently: Take 500 mg by mouth daily. ) 180 capsule 3 10/13/2016 at Unknown time  . oxyCODONE-acetaminophen (PERCOCET) 10-325 MG tablet Take 1 tablet by mouth every 6 (six) hours as needed for pain. for pain (Patient taking differently: Take 1 tablet by mouth every 4 (four) hours as needed for pain. for pain) 30 tablet 0 10/14/2016 at 0600  . Topiramate ER 100 MG CP24 Take 200 mg by mouth at bedtime.  60 capsule 3 10/13/2016 at Unknown time  . medroxyPROGESTERone (DEPO-PROVERA) 150 MG/ML injection Inject 150 mg into the muscle every 3 (three) months.    More than a month at Unknown time    Allergies: Ultram [tramadol]; Zofran [ondansetron hcl]; Buprenorphine hcl; Fentanyl; Morphine and related; and Tape Past Medical History:  Diagnosis Date  . Anemia   . Depression, major, recurrent (HCC)   . Migraines   . Sickle cell anemia (HCC)    Past Surgical History:  Procedure Laterality Date  . CESAREAN SECTION    . CHOLECYSTECTOMY  2000  . IR GENERIC HISTORICAL  10/08/2015   IR US GUIDE VASC ACCESS RIGHT 10/08/2015 Simonne ComeJohn Watts, [facility]  . IR GENERIC HISTORICAL  10/08/2015   IR FLUORO GUIDE CV LINE RIGHT 10/08/2015 Simonne ComeJohn Watts, [facility]  . MULTIPLE TOOTH EXTRACTIONS N/A   . port a cath placement Right    about 6-7 years ago  . removal of porta cath Right 09/11/15  . TUBAL LIGATION     Family History  Problem Relation Age of Onset  . Sickle cell trait Father   . Sickle cell trait Mother   . Sickle cell anemia Other    Social History   Social History  . Marital status: Single    Spouse name: N/A  . Number of children: N/A  . Years of education: N/A   Occupational History  . None    Social History Main Topics  . Smoking status: Current Some Day Smoker    Packs/day: 1.00  .  Smokeless tobacco: Current User  . Alcohol use No  . Drug use: No  . Sexual activity: Yes    Partners: Male    Birth control/ protection: Injection     Comment: 1 new partner recently, no concern about STI, uses condoms   Other Topics Concern  . Not on file   Social History Narrative   55 year old daughter   Review of Systems: A comprehensive review of systems was negative  Respiratory: Negative  Cardiovascular: Negative  Gastrointestinal: Negative  Musculoskeletal: positive pain   Physical Exam: Blood pressure 113/76, pulse 98, temperature 98.8 F (37.1 C), temperature source  Oral, resp. rate 20, height 5' (1.524 m), weight 132 lb (59.9 kg), SpO2 99 %. General Appearance:  Alert, cooperative, no distress, appears stated age  Head:  Normocephalic, without obvious abnormality, atraumatic  Eyes:  PERRL, EOM's intact,scleral icterus   Back:  Symmetric, no curvature, ROM normal, no CVA tenderness  Lungs:  Clear to auscultation bilaterally, respirations unlabored  Chest Wall:  No tenderness or deformity  Heart:  Regular rate and rhythm, S1 and S2 normal, no murmur, rub or gallop  Abdomen:  Soft, non-tender, bowel sounds active all four quadrants,  no masses, no organomegaly  Extremities: Extremities normal, atraumatic, no cyanosis or edema  Pulses: 2+ and symmetric all extremities  Skin: Skin color, texture, turgor normal, no rashes or lesions  Neurologic: Normal strength    Lab results: Pending   Imaging results:  N/A   Assessment & Plan:  Patient will be admitted to the day infusion center for extended observation  Start IV D5.45 for cellular rehydration at 125/hr  Start Toradol 30 mg IV every 6 hours for inflammation.  Start Dilaudid PCA High Concentration per weight based protocol.   Patient will be re-evaluated for pain intensity in the context of function and relationship to baseline as care progresses.  If no significant pain relief, will transfer patient to inpatient services for a higher level of care.   Will check CBC w/differential, CMP, and Reticulocyte    Brittany Foster 10/14/2016, 11:15 AM

## 2016-10-14 NOTE — Telephone Encounter (Signed)
Pt called requesting to come to the [facility] for treatment. Pt states her pain is in her legs, hips and back and it is a 7/10. She last took her Oxy at 6am this morning and she has been hydrating. She denies fever, chest pain, vomiting, diarrhea,or  abdominal pain. She states she does have some nausea.   Pt was placed on hold while speaking with the provider.  After speaking with the provider, pt was told that she could come in for treatment. She stated that she had to find a ride and would call back if she could not get one.

## 2016-10-14 NOTE — Discharge Instructions (Signed)
Sickle Cell Anemia, Adult °Sickle cell anemia is a condition where your red blood cells are shaped like sickles. Red blood cells carry oxygen through the body. Sickle-shaped red blood cells do not live as long as normal red blood cells. They also clump together and block blood from flowing through the blood vessels. These things prevent the body from getting enough oxygen. Sickle cell anemia causes organ damage and pain. It also increases the risk of infection. °Follow these instructions at home: °· Drink enough fluid to keep your pee (urine) clear or pale yellow. Drink more in hot weather and during exercise. °· Do not smoke. Smoking lowers oxygen levels in the blood. °· Only take over-the-counter or prescription medicines as told by your doctor. °· Take antibiotic medicines as told by your doctor. Make sure you finish them even if you start to feel better. °· Take supplements as told by your doctor. °· Consider wearing a medical alert bracelet. This tells anyone caring for you in an emergency of your condition. °· When traveling, keep your medical information, doctors' names, and the medicines you take with you at all times. °· If you have a fever, do not take fever medicines right away. This could cover up a problem. Tell your doctor. °· Keep all follow-up visits with your doctor. Sickle cell anemia requires regular medical care. °Contact a doctor if: °You have a fever. °Get help right away if: °· You feel dizzy or faint. °· You have new belly (abdominal) pain, especially on the left side near the stomach area. °· You have a lasting, often uncomfortable and painful erection of the penis (priapism). If it is not treated right away, you will become unable to have sex (impotence). °· You have numbness in your arms or legs or you have a hard time moving them. °· You have a hard time talking. °· You have a fever or lasting symptoms for more than 2-3 days. °· You have a fever and your symptoms suddenly get  worse. °· You have signs or symptoms of infection. These include: °? Chills. °? Being more tired than normal (lethargy). °? Irritability. °? Poor eating. °? Throwing up (vomiting). °· You have pain that is not helped with medicine. °· You have shortness of breath. °· You have pain in your chest. °· You are coughing up pus-like or bloody mucus. °· You have a stiff neck. °· Your feet or hands swell or have pain. °· Your belly looks bloated. °· Your joints hurt. °This information is not intended to replace advice given to you by your health care provider. Make sure you discuss any questions you have with your health care provider. °Document Released: 12/07/2012 Document Revised: 07/25/2015 Document Reviewed: 09/28/2012 °Elsevier Interactive Patient Education © 2017 [employer] ° °

## 2016-10-14 NOTE — Progress Notes (Signed)
Pt [facility] the [facility] for treatment of pain. She states her pain is 8/10 and is generalized. She was treated with IV fluids, rest, Dilaudid PCA and Toradol. Her pain was down to 7/10 at discharge.Pt stated that she was told that she could come back in the morning if she was not feeling better. She stated that she would call first. Pt was alert, oriented and ambulatory at discharge.

## 2016-10-15 ENCOUNTER — Non-Acute Institutional Stay (HOSPITAL_COMMUNITY)
Admission: AD | Admit: 2016-10-15 | Discharge: 2016-10-15 | Disposition: A | Discharge status: Home / Self Care | Payer: Medicaid Other | Source: Ambulatory Visit | Attending: Internal Medicine | Admitting: Internal Medicine

## 2016-10-15 ENCOUNTER — Encounter (HOSPITAL_COMMUNITY): Payer: Self-pay | Admitting: *Deleted

## 2016-10-15 ENCOUNTER — Telehealth (HOSPITAL_COMMUNITY): Payer: Self-pay | Admitting: Hematology

## 2016-10-15 DIAGNOSIS — D5700 Hb-SS disease with crisis, unspecified: Secondary | ICD-10-CM | POA: Diagnosis not present

## 2016-10-15 DIAGNOSIS — Z79891 Long term (current) use of opiate analgesic: Secondary | ICD-10-CM | POA: Diagnosis not present

## 2016-10-15 DIAGNOSIS — Z793 Long term (current) use of hormonal contraceptives: Secondary | ICD-10-CM | POA: Insufficient documentation

## 2016-10-15 DIAGNOSIS — Z888 Allergy status to other drugs, medicaments and biological substances status: Secondary | ICD-10-CM | POA: Insufficient documentation

## 2016-10-15 DIAGNOSIS — F329 Major depressive disorder, single episode, unspecified: Secondary | ICD-10-CM | POA: Diagnosis not present

## 2016-10-15 DIAGNOSIS — Z79899 Other long term (current) drug therapy: Secondary | ICD-10-CM | POA: Insufficient documentation

## 2016-10-15 MED ORDER — KETOROLAC TROMETHAMINE 30 MG/ML IJ SOLN
15.0000 mg | Freq: Once | INTRAMUSCULAR | Status: AC
  Administered 2016-10-15: 15 mg via INTRAVENOUS
  Filled 2016-10-15: qty 1

## 2016-10-15 MED ORDER — SODIUM CHLORIDE 0.9% FLUSH
10.0000 mL | INTRAVENOUS | Status: AC | PRN
  Administered 2016-10-15: 10 mL

## 2016-10-15 MED ORDER — HEPARIN SOD (PORK) LOCK FLUSH 100 UNIT/ML IV SOLN
250.0000 [IU] | INTRAVENOUS | Status: AC | PRN
  Administered 2016-10-15: 250 [IU]
  Filled 2016-10-15: qty 5

## 2016-10-15 MED ORDER — OXYCODONE HCL 5 MG PO TABS
10.0000 mg | ORAL_TABLET | Freq: Once | ORAL | Status: AC
  Administered 2016-10-15: 10 mg via ORAL

## 2016-10-15 MED ORDER — HYDROMORPHONE HCL 2 MG/ML IJ SOLN
2.0000 mg | Freq: Once | INTRAMUSCULAR | Status: AC
  Administered 2016-10-15: 2 mg via INTRAVENOUS
  Filled 2016-10-15: qty 1

## 2016-10-15 MED ORDER — OXYCODONE HCL 5 MG PO TABS
10.0000 mg | ORAL_TABLET | Freq: Once | ORAL | Status: DC
  Filled 2016-10-15: qty 2

## 2016-10-15 MED ORDER — POTASSIUM CHLORIDE CRYS ER 20 MEQ PO TBCR
20.0000 meq | EXTENDED_RELEASE_TABLET | Freq: Once | ORAL | Status: AC
  Administered 2016-10-15: 20 meq via ORAL
  Filled 2016-10-15: qty 1

## 2016-10-15 MED ORDER — DEXTROSE-NACL 5-0.45 % IV SOLN
INTRAVENOUS | Status: DC
  Administered 2016-10-15: 10:00:00 via INTRAVENOUS

## 2016-10-15 NOTE — Telephone Encounter (Signed)
No answer-- unable to leave message.

## 2016-10-15 NOTE — Discharge Instructions (Signed)
Follow up with pain management as scheduled Follow up with hematology as scheduled  Discussed the importance of drinking 64 ounces of water daily. The Importance of Water. To help prevent pain crises, it is important to drink plenty of water throughout the day. This is because dehydration of red blood cells may lead to the sickling process.     Sickle Cell Anemia, Adult Sickle cell anemia is a condition in which red blood cells have an abnormal sickle shape. This abnormal shape shortens the cells life span, which results in a lower than normal concentration of red blood cells in the blood. The sickle shape also causes the cells to clump together and block free blood flow through the blood vessels. As a result, the tissues and organs of the body do not receive enough oxygen. Sickle cell anemia causes organ damage and pain and increases the risk of infection. What are the causes? Sickle cell anemia is a genetic disorder. Those who receive two copies of the gene have the condition, and those who receive one copy have the trait. What increases the risk? The sickle cell gene is most common in people whose families originated in [place]. Other areas of the globe where sickle cell trait occurs include the [place], [place] and [place], the [place], and the [place]. What are the signs or symptoms?  Pain, especially in the extremities, back, chest, or abdomen (common). The pain may start suddenly or may develop following an illness, especially if there is dehydration. Pain can also occur due to overexertion or exposure to extreme temperature changes.  Frequent severe bacterial infections, especially certain types of pneumonia and meningitis.  Pain and swelling in the hands and feet.  Decreased activity.  Loss of appetite.  Change in behavior.  Headaches.  Seizures.  Shortness of breath or difficulty breathing.  Vision changes.  Skin ulcers. Those with the trait may not  have symptoms or they may have mild symptoms. How is this diagnosed? Sickle cell anemia is diagnosed with blood tests that demonstrate the genetic trait. It is often diagnosed during the newborn period, due to mandatory testing nationwide. A variety of blood tests, X-rays, CT scans, MRI scans, ultrasounds, and lung function tests may also be done to monitor the condition. How is this treated? Sickle cell anemia may be treated with:  Medicines. You may be given pain medicines, antibiotic medicines (to treat and prevent infections) or medicines to increase the production of certain types of hemoglobin.  Fluids.  Oxygen.  Blood transfusions.  Follow these instructions at home:  Drink enough fluid to keep your urine clear or pale yellow. Increase your fluid intake in hot weather and during exercise.  Do not smoke. Smoking lowers oxygen levels in the blood.  Only take over-the-counter or prescription medicines for pain, fever, or discomfort as directed by your health care provider.  Take antibiotics as directed by your health care provider. Make sure you finish them it even if you start to feel better.  Take supplements as directed by your health care provider.  Consider wearing a medical alert bracelet. This tells anyone caring for you in an emergency of your condition.  When traveling, keep your medical information, health care provider's names, and the medicines you take with you at all times.  If you develop a fever, do not take medicines to reduce the fever right away. This could cover up a problem that is developing. Notify your health care provider.  Keep all follow-up appointments with your  health care provider. Sickle cell anemia requires regular medical care. Contact a health care provider if: You have a fever. Get help right away if:  You feel dizzy or faint.  You have new abdominal pain, especially on the left side near the stomach area.  You develop a persistent,  often uncomfortable and painful penile erection (priapism). If this is not treated immediately it will lead to impotence.  You have numbness your arms or legs or you have a hard time moving them.  You have a hard time with speech.  You have a fever or persistent symptoms for more than 2-3 days.  You have a fever and your symptoms suddenly get worse.  You have signs or symptoms of infection. These include: ? Chills. ? Abnormal tiredness (lethargy). ? Irritability. ? Poor eating. ? Vomiting.  You develop pain that is not helped with medicine.  You develop shortness of breath.  You have pain in your chest.  You are coughing up pus-like or bloody sputum.  You develop a stiff neck.  Your feet or hands swell or have pain.  Your abdomen appears bloated.  You develop joint pain. This information is not intended to replace advice given to you by your health care provider. Make sure you discuss any questions you have with your health care provider. Document Released: 05/27/2005 Document Revised: 09/06/2015 Document Reviewed: 09/28/2012 Elsevier Interactive Patient Education  2017 [employer]

## 2016-10-15 NOTE — Discharge Summary (Signed)
[facility] Discharge Summary   Patient ID: Brittany Foster MRN: [id number] DOB/AGE: 32/03/1985 32 y.o.  Admit date: 10/15/2016 Discharge date: 10/15/2016  Primary Care Physician:  Bing NeighborsHarris, Kimberly S, FNP  Admission Diagnoses:  Active Problems:   Sickle cell pain crisis [facility](HCC)   Discharge Medications:  Allergies as of 10/15/2016      Reactions   Ultram [tramadol] Other (See Comments)   Reaction:  Seizures    Zofran [ondansetron Hcl] Nausea And Vomiting   Buprenorphine Hcl Hives, Other (See Comments)   Reaction:  Shaking    Fentanyl Hives   Morphine And Related Hives, Other (See Comments)   Reaction:  Shaking    Tape Rash      Medication List    TAKE these medications   ARIPiprazole 10 MG tablet Commonly known as:  ABILIFY Take 10 mg by mouth daily   DULoxetine 60 MG capsule Commonly known as:  CYMBALTA TAKE 1 CAPSULE BY MOUTH DAILY.   folic acid 1 MG tablet Commonly known as:  FOLVITE Take 1 tablet (1 mg total) by mouth daily.   hydroxyurea 500 MG capsule Commonly known as:  HYDREA TAKE 2 CAPSULES BY MOUTH DAILY. MAY TAKE WITH FOOD TO MINIMIZE GI SIDE EFFECTS. What changed:  how much to take  how to take this  when to take this  additional instructions   medroxyPROGESTERone 150 MG/ML injection Commonly known as:  DEPO-PROVERA Inject 150 mg into the muscle every 3 (three) months.   oxyCODONE-acetaminophen 10-325 MG tablet Commonly known as:  PERCOCET Take 1 tablet by mouth every 6 (six) hours as needed for pain. for pain What changed:  when to take this  additional instructions   Topiramate ER 100 MG Cp24 Commonly known as:  TROKENDI XR Take 200 mg by mouth at bedtime.        Consults:  None  Significant Diagnostic Studies:  No results found.   [facility] Course: Brittany CampbellMiranda Foster, a 31 year old female with a history of sickle cell anemia, HbSS presents complaining of generalized pain. Brittany Foster has had increased  hospitalizations and emergency room visits over the past several months. She was treated and evaluated in the day infusion center on 10/14/2016. She says that pain did not improve prior to discharge.   Pain is primarily to hips and lower extremities. She says that pain has been increased over the past several days.  She says that pain intensity is 8/10, constant and throbbing. She last had Percocet 10-325 this am without sustained relief. She denies headache, fatigue, paresthesias, chest pain, dysuria, nausea, vomiting, or diarrhea.   Reviewed labs, patient hemodynamically stable.  Potassium decreased at 3.3, K-dur 20 mEQ times 1.   Pain management:  D5.45 @ 75 ml/hr for cellular rehydration Toradol 30 mg IV times one for inflammation Dilaudid 2 mg IV per clinician assisted doses for a total of 6 mg.  Oxycodone 10 mg po times one  Pain intensity decreased from 8/10 to 6/10 prior to discharge Patient alert, oriented, and ambulating  Discharge instructions:  Patient will discharge home with family in stable condition Resume all home medications Patient will discharge with PICC line in place. Patient will need to follow up with primary provider to pull PICC line. Patient was not forthcoming on while PICC line was left in. She had it [facility] at [facility] in [place], [place]. Dressing clean, dry, and intact. No visible signs of infection  Discussed the importance of drinking 64 ounces  of water daily. The Importance of Water. To help prevent pain crises, it is important to drink plenty of water throughout the day. This is because dehydration of red blood cells may lead to the sickling process.    The patient was given clear instructions to go to ER or return to medical center if symptoms do not improve, worsen or new problems develop. The patient verbalized understanding.        Physical Exam at Discharge:  BP (!) 103/59 (BP Location: Left Arm)   Pulse 98   Temp 98.7 F (37.1 C) (Oral)    Resp 18   Ht 5' (1.524 m)   Wt 133 lb (60.3 kg)   SpO2 100%   BMI 25.97 kg/m    General Appearance:    Alert, cooperative, no distress, appears stated age  Head:    Normocephalic, without obvious abnormality, atraumatic  Throat:   Lips, mucosa, and tongue normal; partially edentulous  Neck:   Supple, symmetrical, trachea midline, no adenopathy;    thyroid:  no enlargement/tenderness/nodules; no carotid   bruit or JVD  Back:     Symmetric, no curvature, ROM normal, no CVA tenderness  Lungs:     Clear to auscultation bilaterally, respirations unlabored  Chest Wall:    No tenderness or deformity. Port to right chest, clean, dry,  and intact   Heart:    Regular rate and rhythm, S1 and S2 normal, no murmur, rub   or gallop   Disposition at Discharge: 01-Home or Self Care  Discharge Orders: Discharge Instructions    Discharge patient    Complete by:  As directed    Discharge disposition:  01-Home or Self Care   Discharge patient date:  10/15/2016      Condition at Discharge:   Stable  Time spent on Discharge:  Greater than 30 minutes.  Signed: Bush Murdoch M 10/15/2016, 12:40 PM

## 2016-10-15 NOTE — H&P (Signed)
[facility] History and Physical   Date: 10/15/2016  Patient name: Brittany CampbellMiranda Foster Medical record number: [id number] Date of birth: 01/28/1986 Age: 31 y.o. Gender: female PCP: Bing NeighborsHarris, Kimberly S, FNP  Attending physician: Quentin AngstJegede, Olugbemiga E, MD  Chief Complaint: Generalized pain  History of Present Illness: Brittany CampbellMiranda Foster, a 31 year old female with a history of sickle cell anemia, HbSS presents complaining of generalized pain. Brittany Foster has had increased hospitalizations and emergency room visits over the past several months. She was treated and evaluated in the day infusion center on 10/14/2016. She says that pain did not improve prior to discharge.   Pain is primarily to hips and lower extremities. She says that pain has been increased over the past several days.  She says that pain intensity is 8/10, constant and throbbing. She last had Percocet 10-325 this am without sustained relief. She denies headache, fatigue, paresthesias, chest pain, dysuria, nausea, vomiting, or diarrhea.   Meds: Prescriptions Prior to Admission  Medication Sig Dispense Refill Last Dose  . ARIPiprazole (ABILIFY) 10 MG tablet Take 10 mg by mouth daily  3 Past Week at Unknown time  . DULoxetine (CYMBALTA) 60 MG capsule TAKE 1 CAPSULE BY MOUTH DAILY. 30 capsule 3 Past Week at Unknown time  . folic acid (FOLVITE) 1 MG tablet Take 1 tablet (1 mg total) by mouth daily. 90 tablet 3 Past Week at Unknown time  . hydroxyurea (HYDREA) 500 MG capsule TAKE 2 CAPSULES BY MOUTH DAILY. MAY TAKE WITH FOOD TO MINIMIZE GI SIDE EFFECTS. (Patient taking differently: Take 500 mg by mouth daily. ) 180 capsule 3 Past Week at Unknown time  . oxyCODONE-acetaminophen (PERCOCET) 10-325 MG tablet Take 1 tablet by mouth every 6 (six) hours as needed for pain. for pain (Patient taking differently: Take 1 tablet by mouth every 4 (four) hours as needed for pain. for pain) 30 tablet 0 10/15/2016 at Unknown time  . Topiramate ER 100 MG CP24  Take 200 mg by mouth at bedtime. 60 capsule 3 Past Week at Unknown time  . medroxyPROGESTERone (DEPO-PROVERA) 150 MG/ML injection Inject 150 mg into the muscle every 3 (three) months.    More than a month at Unknown time    Allergies: Ultram [tramadol]; Zofran [ondansetron hcl]; Buprenorphine hcl; Fentanyl; Morphine and related; and Tape Past Medical History:  Diagnosis Date  . Anemia   . Depression, major, recurrent (HCC)   . Migraines   . Sickle cell anemia (HCC)    Past Surgical History:  Procedure Laterality Date  . CESAREAN SECTION    . CHOLECYSTECTOMY  2000  . IR GENERIC HISTORICAL  10/08/2015   IR US GUIDE VASC ACCESS RIGHT 10/08/2015 Simonne ComeJohn Watts, MD [facility]  . IR GENERIC HISTORICAL  10/08/2015   IR FLUORO GUIDE CV LINE RIGHT 10/08/2015 Simonne ComeJohn Watts, MD [facility]  . MULTIPLE TOOTH EXTRACTIONS N/A   . port a cath placement Right    about 6-7 years ago  . removal of porta cath Right 09/11/15  . TUBAL LIGATION     Family History  Problem Relation Age of Onset  . Sickle cell trait Father   . Sickle cell trait Mother   . Sickle cell anemia Other    Social History   Social History  . Marital status: Single    Spouse name: N/A  . Number of children: N/A  . Years of education: N/A   Occupational History  . None    Social History Main Topics  .  Smoking status: Current Some Day Smoker    Packs/day: 1.00  . Smokeless tobacco: Current User  . Alcohol use No  . Drug use: No  . Sexual activity: Yes    Partners: Male    Birth control/ protection: Injection     Comment: 1 new partner recently, no concern about STI, uses condoms   Other Topics Concern  . Not on file   Social History Narrative   87 year old daughter    Review of Systems: Review of Systems  Constitutional: Negative for chills, fever and malaise/fatigue.  HENT: Negative.   Respiratory: Negative.   Cardiovascular: Negative for chest pain and palpitations.  Gastrointestinal: Positive for nausea.  Negative for heartburn.  Genitourinary: Negative.   Musculoskeletal: Positive for back pain, joint pain and myalgias.  Skin: Negative.   Neurological: Negative.   Endo/Heme/Allergies: Negative.   Psychiatric/Behavioral: Negative.     Physical Exam: Blood pressure 126/72, pulse (!) 112, temperature 99.3 F (37.4 C), temperature source Oral, resp. rate 18, height 5' (1.524 m), weight 133 lb (60.3 kg), SpO2 100 %. BP 126/72 (BP Location: Left Arm)   Pulse (!) 112   Temp 99.3 F (37.4 C) (Oral)   Resp 18   Ht 5' (1.524 m)   Wt 133 lb (60.3 kg)   SpO2 100%   BMI 25.97 kg/m   General Appearance:    Alert, cooperative, no distress, appears stated age  Head:    Normocephalic, without obvious abnormality, atraumatic  Eyes:    PERRL, conjunctiva/corneas clear, EOM's intact, fundi    benign, both eyes  Ears:    Normal TM's and external ear canals, both ears  Nose:   Nares normal, septum midline, mucosa normal, no drainage    or sinus tenderness  Throat:   Lips, mucosa, and tongue normal; teeth and gums normal  Neck:   Supple, symmetrical, trachea midline, no adenopathy;    thyroid:  no enlargement/tenderness/nodules; no carotid   bruit or JVD  Back:     Symmetric, no curvature, ROM normal, no CVA tenderness  Lungs:     Clear to auscultation bilaterally, respirations unlabored  Chest Wall:    No tenderness or deformity. PICC insertion to right chest, clean, dry, and intact.    Heart:    Regular rate and rhythm, S1 and S2 normal, no murmur, rub   or gallop  Abdomen:     Soft, non-tender, bowel sounds active all four quadrants,    no masses, no organomegaly  Extremities:   Extremities normal, atraumatic, no cyanosis or edema  Pulses:   2+ and symmetric all extremities  Skin:   Skin color, texture, turgor normal, no rashes or lesions  Lymph nodes:   Cervical, supraclavicular, and axillary nodes normal  Neurologic:   CNII-XII intact, normal strength, sensation and reflexes    throughout     Lab results: Results for orders placed or performed during the hospital encounter of 10/14/16 (from the past 24 hour(s))  CBC with Differential/Platelet     Status: Abnormal   Collection Time: 10/14/16 11:32 AM  Result Value Ref Range   WBC 14.6 (H) 4.0 - 10.5 K/uL   RBC 2.60 (L) 3.87 - 5.11 MIL/uL   Hemoglobin 8.6 (L) 12.0 - 15.0 g/dL   HCT 09.8 (L) 11.9 - 14.7 %   MCV 94.2 78.0 - 100.0 fL   MCH 33.1 26.0 - 34.0 pg   MCHC 35.1 30.0 - 36.0 g/dL   RDW 82.9 (H) 56.2 - 13.0 %  Platelets 323 150 - 400 K/uL   Neutrophils Relative % 65 %   Neutro Abs 9.5 (H) 1.7 - 7.7 K/uL   Lymphocytes Relative 26 %   Lymphs Abs 3.8 0.7 - 4.0 K/uL   Monocytes Relative 8 %   Monocytes Absolute 1.2 (H) 0.1 - 1.0 K/uL   Eosinophils Relative 1 %   Eosinophils Absolute 0.2 0.0 - 0.7 K/uL   Basophils Relative 0 %   Basophils Absolute 0.1 0.0 - 0.1 K/uL  Comprehensive metabolic panel     Status: Abnormal   Collection Time: 10/14/16 11:32 AM  Result Value Ref Range   Sodium 141 135 - 145 mmol/L   Potassium 3.3 (L) 3.5 - 5.1 mmol/L   Chloride 114 (H) 101 - 111 mmol/L   CO2 18 (L) 22 - 32 mmol/L   Glucose, Bld 112 (H) 65 - 99 mg/dL   BUN 8 6 - 20 mg/dL   Creatinine, Ser 1.61 0.44 - 1.00 mg/dL   Calcium 9.0 8.9 - 09.6 mg/dL   Total Protein 7.9 6.5 - 8.1 g/dL   Albumin 4.5 3.5 - 5.0 g/dL   AST 46 (H) 15 - 41 U/L   ALT 24 14 - 54 U/L   Alkaline Phosphatase 87 38 - 126 U/L   Total Bilirubin 2.5 (H) 0.3 - 1.2 mg/dL   GFR calc non Af Amer >60 >60 mL/min   GFR calc Af Amer >60 >60 mL/min   Anion gap 9 5 - 15  Reticulocytes     Status: Abnormal   Collection Time: 10/14/16 11:32 AM  Result Value Ref Range   Retic Ct Pct 14.1 (H) 0.4 - 3.1 %   RBC. 2.60 (L) 3.87 - 5.11 MIL/uL   Retic Count, Absolute 366.6 (H) 19.0 - 186.0 K/uL    Imaging results:  No results found.   Assessment & Plan:  Patient will be admitted to the day infusion center for extended observation  Start IV D5.45 for cellular  rehydration at 75/hr  Start Toradol 30 mg IV times one for inflammation.  Start Dilaudid IV per clinician assisted doses  Patient will be re-evaluated for pain intensity in the context of function and relationship to baseline       as care progresses.  If no significant pain relief, will transfer patient to inpatient services for a higher level of care.   Reviewed labs from 10/14/2016, consistent with baseline.    Lorraina Spring M 10/15/2016, 10:11 AM

## 2016-10-15 NOTE — Progress Notes (Signed)
Patient admitted to the [facility] for treatment of pain. She states her pain is 8/10 and is generalized. Patient was treated with IV fluids,  Dilaudid pushes and Toradol. Her pain was down to 7/10 at discharge. Discharge instructions given to patient and patient states an understanding. Patient was alert, oriented and ambulatory at discharge.

## 2016-10-22 ENCOUNTER — Telehealth (HOSPITAL_COMMUNITY): Payer: Self-pay | Admitting: *Deleted

## 2016-10-22 NOTE — Telephone Encounter (Signed)
Patient called requesting treatment at the [facility] c/o bilateral leg, hip and back pain. Patient rates pain a 6/10 on pain scale. Patient denies fever, chest pain, diarrhea or abdominal pain. Patient reports being nauseous and that she vomited on yesterday 8/22. Patient last took her home medication Percocet 5 mg this morning around 6 am with no relief.   Patient placed on a brief hold and provider Julianne Handler FNP notified. Patient advised to take another dose of home medication, increase hydration, rest, and follow up with Pain Management physician. Patient states an understanding.

## 2016-10-23 ENCOUNTER — Telehealth (HOSPITAL_COMMUNITY): Payer: Self-pay | Admitting: Internal Medicine

## 2016-10-23 ENCOUNTER — Telehealth: Payer: Self-pay | Admitting: Family Medicine

## 2016-10-23 ENCOUNTER — Encounter: Payer: Self-pay | Admitting: Family Medicine

## 2016-10-23 NOTE — Telephone Encounter (Signed)
Patient called requesting to come to the day infusion center. I spoke with patient and advised her to follow up with hematology, who agreed to write prescriptions for pain medications per patient. She is going to continue to have chronic pain as long as she does not have pain medications at home. She says that she does not have transportation to get to [place]. I told patient that she could come to the day infusion center for treatment and we would initiate her care plan. She says that her pain is usually not controlled on care plan. She is requesting a call back from [facility] geologist.    Nolon Nations  MSN, FNP-C Patient Care [facility] [address] [place], [place] [address] [phone]

## 2016-10-23 NOTE — Telephone Encounter (Signed)
Pt called and states experiencing pain in back, arms, and legs related to sickle cell disease; pt states that she is taking home pain medications; denies CP, shortness of breath, nausea, vomiting, or diarrhea; NP notified

## 2016-10-24 NOTE — Progress Notes (Signed)
Brittany Foster, a 31 year old patient with a history of sickle cell anemia, HbSS and chronic pain was evaluated at [facility] for a sickle cell crisis. Received a call from Dr. Adelene Amas pertaining to care plan. Discussed patient's care plan at length. Dr. Hyman Hopes was also consulted to discuss plan of care.   Patient has presented to the emergency department at [facility] over the past 4 days requesting IV medications for sickle cell pain crisis.  She is scheduled to follow up with Joaquin Courts, FNP in primary care clinic on Monday, 10/26/2016.    Nolon Nations  MSN, FNP-C Patient Care [facility] [address] [place], [place] [address] [phone]

## 2016-10-25 ENCOUNTER — Encounter (HOSPITAL_COMMUNITY): Payer: Self-pay

## 2016-10-25 ENCOUNTER — Telehealth: Payer: Self-pay | Admitting: Internal Medicine

## 2016-10-25 ENCOUNTER — Inpatient Hospital Stay (HOSPITAL_COMMUNITY)
Admission: AD | Admit: 2016-10-25 | Discharge: 2016-10-30 | DRG: 812 | Disposition: A | Discharge status: Home / Self Care | Payer: Medicaid Other | Source: Other Acute Inpatient Hospital | Attending: Internal Medicine | Admitting: Internal Medicine

## 2016-10-25 DIAGNOSIS — Z832 Family history of diseases of the blood and blood-forming organs and certain disorders involving the immune mechanism: Secondary | ICD-10-CM | POA: Diagnosis not present

## 2016-10-25 DIAGNOSIS — D72829 Elevated white blood cell count, unspecified: Secondary | ICD-10-CM | POA: Diagnosis not present

## 2016-10-25 DIAGNOSIS — D72828 Other elevated white blood cell count: Secondary | ICD-10-CM | POA: Diagnosis present

## 2016-10-25 DIAGNOSIS — D5700 Hb-SS disease with crisis, unspecified: Principal | ICD-10-CM | POA: Diagnosis present

## 2016-10-25 DIAGNOSIS — D638 Anemia in other chronic diseases classified elsewhere: Secondary | ICD-10-CM

## 2016-10-25 DIAGNOSIS — F17210 Nicotine dependence, cigarettes, uncomplicated: Secondary | ICD-10-CM | POA: Diagnosis present

## 2016-10-25 DIAGNOSIS — F339 Major depressive disorder, recurrent, unspecified: Secondary | ICD-10-CM | POA: Diagnosis present

## 2016-10-25 DIAGNOSIS — E876 Hypokalemia: Secondary | ICD-10-CM

## 2016-10-25 MED ORDER — SODIUM CHLORIDE 0.9 % IV SOLN
25.0000 mg | INTRAVENOUS | Status: DC | PRN
  Filled 2016-10-25: qty 0.5

## 2016-10-25 MED ORDER — OXYCODONE-ACETAMINOPHEN 5-325 MG PO TABS
1.0000 | ORAL_TABLET | Freq: Once | ORAL | Status: AC
  Administered 2016-10-25: 1 via ORAL
  Filled 2016-10-25: qty 1

## 2016-10-25 MED ORDER — ARIPIPRAZOLE 10 MG PO TABS
10.0000 mg | ORAL_TABLET | Freq: Every day | ORAL | Status: DC
  Administered 2016-10-25 – 2016-10-30 (×6): 10 mg via ORAL
  Filled 2016-10-25 (×6): qty 1

## 2016-10-25 MED ORDER — NALOXONE HCL 0.4 MG/ML IJ SOLN: 0.4000 mg | INTRAMUSCULAR | Status: DC | PRN

## 2016-10-25 MED ORDER — OXYCODONE HCL 5 MG PO TABS
5.0000 mg | ORAL_TABLET | Freq: Once | ORAL | Status: AC
  Administered 2016-10-25: 5 mg via ORAL
  Filled 2016-10-25: qty 1

## 2016-10-25 MED ORDER — SENNOSIDES-DOCUSATE SODIUM 8.6-50 MG PO TABS
1.0000 | ORAL_TABLET | Freq: Two times a day (BID) | ORAL | Status: DC
  Administered 2016-10-26 – 2016-10-28 (×2): 1 via ORAL
  Filled 2016-10-25 (×11): qty 1

## 2016-10-25 MED ORDER — HYDROXYUREA 500 MG PO CAPS
1500.0000 mg | ORAL_CAPSULE | Freq: Every day | ORAL | Status: DC
  Administered 2016-10-25 – 2016-10-30 (×6): 1500 mg via ORAL
  Filled 2016-10-25 (×6): qty 3

## 2016-10-25 MED ORDER — TOPIRAMATE ER 100 MG PO CAP24
100.0000 mg | ORAL_CAPSULE | Freq: Every day | ORAL | Status: DC
  Filled 2016-10-25: qty 1

## 2016-10-25 MED ORDER — DULOXETINE HCL 60 MG PO CPEP
60.0000 mg | ORAL_CAPSULE | Freq: Every day | ORAL | Status: DC
  Administered 2016-10-25 – 2016-10-30 (×6): 60 mg via ORAL
  Filled 2016-10-25 (×6): qty 1

## 2016-10-25 MED ORDER — HYDROMORPHONE HCL-NACL 0.5-0.9 MG/ML-% IV SOSY
1.0000 mg | PREFILLED_SYRINGE | Freq: Once | INTRAVENOUS | Status: AC
  Administered 2016-10-25: 1 mg via INTRAVENOUS
  Filled 2016-10-25: qty 2

## 2016-10-25 MED ORDER — HYDROMORPHONE 1 MG/ML IV SOLN
INTRAVENOUS | Status: DC
  Administered 2016-10-25: 14:00:00 via INTRAVENOUS
  Administered 2016-10-25: 11.6 mg via INTRAVENOUS
  Administered 2016-10-26: 3.6 mg via INTRAVENOUS
  Administered 2016-10-26: 08:00:00 via INTRAVENOUS
  Administered 2016-10-26: 3.6 mg via INTRAVENOUS
  Filled 2016-10-25 (×2): qty 25

## 2016-10-25 MED ORDER — PROMETHAZINE HCL 25 MG PO TABS
25.0000 mg | ORAL_TABLET | Freq: Four times a day (QID) | ORAL | Status: DC | PRN
  Administered 2016-10-28 – 2016-10-30 (×3): 25 mg via ORAL
  Filled 2016-10-25 (×3): qty 1

## 2016-10-25 MED ORDER — DEXTROSE-NACL 5-0.45 % IV SOLN
INTRAVENOUS | Status: DC
  Administered 2016-10-25 – 2016-10-28 (×3): via INTRAVENOUS

## 2016-10-25 MED ORDER — ENOXAPARIN SODIUM 40 MG/0.4ML ~~LOC~~ SOLN: 40.0000 mg | SUBCUTANEOUS | Status: DC

## 2016-10-25 MED ORDER — ONDANSETRON HCL 4 MG/2ML IJ SOLN: 4.0000 mg | Freq: Four times a day (QID) | INTRAMUSCULAR | Status: DC | PRN

## 2016-10-25 MED ORDER — DIPHENHYDRAMINE HCL 25 MG PO CAPS
25.0000 mg | ORAL_CAPSULE | ORAL | Status: DC | PRN
  Administered 2016-10-30: 50 mg via ORAL
  Filled 2016-10-25 (×3): qty 2

## 2016-10-25 MED ORDER — TOPIRAMATE ER 100 MG PO CAP24: 100.0000 mg | ORAL_CAPSULE | Freq: Every day | ORAL | Status: DC

## 2016-10-25 MED ORDER — SODIUM CHLORIDE 0.9% FLUSH: 9.0000 mL | INTRAVENOUS | Status: DC | PRN

## 2016-10-25 MED ORDER — KETOROLAC TROMETHAMINE 30 MG/ML IJ SOLN
30.0000 mg | Freq: Four times a day (QID) | INTRAMUSCULAR | Status: AC
  Administered 2016-10-25 – 2016-10-30 (×20): 30 mg via INTRAVENOUS
  Filled 2016-10-25 (×19): qty 1

## 2016-10-25 MED ORDER — POLYETHYLENE GLYCOL 3350 17 G PO PACK: 17.0000 g | PACK | Freq: Every day | ORAL | Status: DC | PRN

## 2016-10-25 MED ORDER — POTASSIUM CHLORIDE CRYS ER 20 MEQ PO TBCR
40.0000 meq | EXTENDED_RELEASE_TABLET | Freq: Two times a day (BID) | ORAL | Status: DC
  Administered 2016-10-25 – 2016-10-28 (×6): 40 meq via ORAL
  Administered 2016-10-30: 20 meq via ORAL
  Filled 2016-10-25 (×11): qty 2

## 2016-10-25 MED ORDER — FOLIC ACID 1 MG PO TABS
1.0000 mg | ORAL_TABLET | Freq: Every day | ORAL | Status: DC
  Administered 2016-10-25 – 2016-10-30 (×6): 1 mg via ORAL
  Filled 2016-10-25 (×6): qty 1

## 2016-10-25 NOTE — Progress Notes (Signed)
Set up initial PCA with CN Ify. Completed dual sign off

## 2016-10-25 NOTE — Telephone Encounter (Signed)
Notified that patient being transferred from [facility] to [facility] at request of patient as she lives in [place].

## 2016-10-25 NOTE — Progress Notes (Signed)
This is a no charge note  Transfer from [facility] per Dr. Olena Heckle  31 year old lady with past medical history of sickle cell disease, depression, migraine headaches, anemia, who presents with sickle cell pain crisis. Patient has pain in left rib cage and hip.  Patient was found to have stable hemoglobin 8.7 which was 8.6 on 10/14/16, WBC 16.6, potassium 3.2, normal creatinine, temperature 99.7, heart rate 100-->80, respiration rate 18, oxygen sat 95% on room air. CT angiograms negative for PE and pneumonia. Pt is accepted to tele bed as inpt. Pt was given 2.5 L NS in ED. Will continue 125 cc/h of D5-1/2NS.   Please call manager of [facility] at [phone] when pt arrives to floor   Lorretta Harp, MD  [facility] Pager [phone]  If 7PM-7AM, please contact night-coverage [email or web address] Password [email or web address] 10/25/2016, 3:08 AM

## 2016-10-25 NOTE — H&P (Signed)
Hospital Admission Note Date: 10/25/2016  Patient name: Brittany Foster Medical record number: [id number] Date of birth: [birth year] Age: 31 y.o. Gender: female PCP: Bing Neighbors, FNP  Attending physician: Altha Harm, MD  Chief Complaint: Sickle Cell Crisis  History of Present Illness: This is an opiate naive patient with Hb SS who presented to OSH where she was assessed as having sickle cell crisis without complications. Pt requested transfer to [facility] and has just arrived.   Pt reports pain in BLE's, B/L hips,and left ribcage at an intensity of 9/10. She reports that she took up to 4 Percocet  10-325 mg yesterday with no relief of her pain. Pt has not had a provider to prescribe Opiate on a chronic basis and reports that she has been taking opiates only sparingly over the last several months. She reports that her Hydrea was recently increased to 1500 mg daily by Dr. Hyacinth Meeker at [facility] in [place] (verified in review of Care Everywhere records).  She denies any fevers, chills, SOB, Vomiting diarrhea, cough, dizziness, HA or weakness.   At the OSH she received Dilaudid IV in an IVPB. A review of her records from the OSH shows that a D-dimer done because of ribcage pain was elevated but a subsequent CTA of the chest was negative for PE.    Review of Labs from OSH: Labs: WBC 16.6 (Normal differential), Hb 8.7 g.dL, Hct 28.4, reticulocytes 15.5%, Platelets 328, BUN 8, Cr 0.39, Na 139, K 3.2, Cl 109, CO2 22., U/A (-). 12 lead EKG shows sinus tachycardia.   Scheduled Meds: . ARIPiprazole  10 mg Oral Daily  . DULoxetine  60 mg Oral Daily  . enoxaparin (LOVENOX) injection  40 mg Subcutaneous Q24H  . folic acid  1 mg Oral Daily  . HYDROmorphone   Intravenous Q4H  .  HYDROmorphone (DILAUDID) injection  1 mg Intravenous Once  . hydroxyurea  1,500 mg Oral Daily  . ketorolac  30 mg Intravenous Q6H  . oxyCODONE-acetaminophen  1 tablet Oral Once   And  . oxyCODONE  5 mg Oral Once  .  senna-docusate  1 tablet Oral BID  . Topiramate ER  100 mg Oral QHS   Continuous Infusions: . dextrose 5 % and 0.45% NaCl    . diphenhydrAMINE (BENADRYL) IVPB(SICKLE CELL ONLY)     PRN Meds:.diphenhydrAMINE **OR** diphenhydrAMINE (BENADRYL) IVPB(SICKLE CELL ONLY), naloxone **AND** sodium chloride flush, ondansetron (ZOFRAN) IV, polyethylene glycol Allergies: Ultram [tramadol]; Zofran [ondansetron hcl]; Buprenorphine hcl; Fentanyl; Morphine and related; and Tape Past Medical History:  Diagnosis Date  . Anemia   . Depression, major, recurrent (HCC)   . Migraines   . Sickle cell anemia (HCC)    Past Surgical History:  Procedure Laterality Date  . CESAREAN SECTION    . CHOLECYSTECTOMY  2000  . IR GENERIC HISTORICAL  10/08/2015   IR US GUIDE VASC ACCESS RIGHT 10/08/2015 Simonne Come, MD [facility]  . IR GENERIC HISTORICAL  10/08/2015   IR FLUORO GUIDE CV LINE RIGHT 10/08/2015 Simonne Come, MD [facility]  . MULTIPLE TOOTH EXTRACTIONS N/A   . port a cath placement Right    about 6-7 years ago  . removal of porta cath Right 09/11/15  . TUBAL LIGATION     Family History  Problem Relation Age of Onset  . Sickle cell trait Father   . Sickle cell trait Mother   . Sickle cell anemia Other    Social History   Social History  . Marital status: Single  Spouse name: N/A  . Number of children: N/A  . Years of education: N/A   Occupational History  . None    Social History Main Topics  . Smoking status: Current Some Day Smoker    Packs/day: 1.00  . Smokeless tobacco: Current User  . Alcohol use No  . Drug use: No  . Sexual activity: Yes    Partners: Male    Birth control/ protection: Injection     Comment: 1 new partner recently, no concern about STI, uses condoms   Other Topics Concern  . Not on file   Social History Narrative   55 year old daughter   Review of Systems: Pertinent items noted in HPI and remainder of comprehensive ROS otherwise negative.  Physical  Exam: No intake or output data in the 24 hours ending 10/25/16 1201 General: Alert, awake, oriented x3, in no apparent distress.  HEENT: [place]/AT PEERL, EOMI, anicteric  Neck: Trachea midline,  no masses, no thyromegal,y no JVD, no carotid bruit OROPHARYNX:  Moist, No exudate/ erythema/lesions.  Heart: Regular rate and rhythm, without murmurs, rubs, gallops, PMI non-displaced, no heaves or thrills on palpation.  Lungs: Clear to auscultation, no wheezing or rhonchi noted. No increased vocal fremitus resonant to percussion  Abdomen: Soft, nontender, nondistended, positive bowel sounds, no masses no hepatosplenomegaly noted..  Neuro: No focal neurological deficits noted cranial nerves II through XII grossly intact.  Strength at baseline in bilateral upper and lower extremities. Musculoskeletal: No warmth swelling or erythema around joints, no spinal tenderness noted. Psychiatric: Patient alert and oriented x3, good insight and cognition, good recent to remote recall.   Lab results: No results for input(s): NA, K, CL, CO2, GLUCOSE, BUN, CREATININE, CALCIUM, MG, PHOS in the last 72 hours. No results for input(s): AST, ALT, ALKPHOS, BILITOT, PROT, ALBUMIN in the last 72 hours. No results for input(s): LIPASE, AMYLASE in the last 72 hours. No results for input(s): WBC, NEUTROABS, HGB, HCT, MCV, PLT in the last 72 hours. No results for input(s): CKTOTAL, CKMB, CKMBINDEX, TROPONINI in the last 72 hours. Invalid input(s): POCBNP No results for input(s): DDIMER in the last 72 hours. No results for input(s): HGBA1C in the last 72 hours. No results for input(s): CHOL, HDL, LDLCALC, TRIG, CHOLHDL, LDLDIRECT in the last 72 hours. No results for input(s): TSH, T4TOTAL, T3FREE, THYROIDAB in the last 72 hours.  Invalid input(s): FREET3 No results for input(s): VITAMINB12, FOLATE, FERRITIN, TIBC, IRON, RETICCTPCT in the last 72 hours. Imaging results:  No results found.  Assessment and Plan: 1. Hb SS with  crisis: Pt is essentially opiate naive as she has not had a MME of 60 mg/day over the past 2 weeks although she was opiate tolerant in the past. Will start on a PCA at a lower dose of 0.4 mg bolus with lockout of 10 minutes and 1 hour total of 2.4 mg, Toradol and IVF.  2. Hypokalemia: Replete orally and recheck in 2 days. 3. Anemia of Chronic Disease: Hb stable at baseline. Continue Hydrea 1500 mg daily.  4. Leukocytosis: Due to sickle cell crisis.  5. Tobacco Use Disorder: Pt refused Nicotine TTS 6. DVT Prophylaxis: Lovenox  In excess of 75 minutes spent during this encounter. Greater than 50% of time spent in face to face contact, counseling and coordination of care.   Kaedin Hicklin A. 10/25/2016, 12:01 PM    Catha Ontko A.  Pager [phone]. If 7PM-7AM, please contact night-coverage.

## 2016-10-26 ENCOUNTER — Ambulatory Visit: Payer: Self-pay | Admitting: Family Medicine

## 2016-10-26 LAB — BASIC METABOLIC PANEL
Anion gap: 3 — ABNORMAL LOW (ref 5–15)
BUN: 7 mg/dL (ref 6–20)
CALCIUM: 8.4 mg/dL — AB (ref 8.9–10.3)
CHLORIDE: 113 mmol/L — AB (ref 101–111)
CO2: 22 mmol/L (ref 22–32)
CREATININE: 0.4 mg/dL — AB (ref 0.44–1.00)
GFR calc Af Amer: >60 mL/min (ref >60)
GFR calc non Af Amer: >60 mL/min (ref >60)
GLUCOSE: 92 mg/dL (ref 65–99)
Potassium: 4.3 mmol/L (ref 3.5–5.1)
Sodium: 138 mmol/L (ref 135–145)

## 2016-10-26 LAB — MAGNESIUM: Magnesium: 1.8 mg/dL (ref 1.7–2.4)

## 2016-10-26 LAB — PREGNANCY, URINE: PREG TEST UR: NEGATIVE

## 2016-10-26 MED ORDER — OXYCODONE HCL 5 MG PO TABS
5.0000 mg | ORAL_TABLET | ORAL | Status: DC
  Administered 2016-10-26 – 2016-10-30 (×21): 5 mg via ORAL
  Filled 2016-10-26 (×23): qty 1

## 2016-10-26 MED ORDER — TOPIRAMATE 25 MG PO TABS
50.0000 mg | ORAL_TABLET | Freq: Two times a day (BID) | ORAL | Status: DC
  Administered 2016-10-26 – 2016-10-30 (×8): 50 mg via ORAL
  Filled 2016-10-26 (×9): qty 2

## 2016-10-26 MED ORDER — SODIUM CHLORIDE 0.9% FLUSH
10.0000 mL | INTRAVENOUS | Status: DC | PRN
  Administered 2016-10-27: 10 mL
  Filled 2016-10-26: qty 40

## 2016-10-26 MED ORDER — OXYCODONE-ACETAMINOPHEN 5-325 MG PO TABS
1.0000 | ORAL_TABLET | ORAL | Status: DC
  Administered 2016-10-26 – 2016-10-30 (×21): 1 via ORAL
  Filled 2016-10-26 (×23): qty 1

## 2016-10-26 MED ORDER — HYDROMORPHONE 1 MG/ML IV SOLN
INTRAVENOUS | Status: DC
  Administered 2016-10-26: 12 mg via INTRAVENOUS
  Administered 2016-10-26: 4.5 mg via INTRAVENOUS
  Administered 2016-10-27: 05:00:00 via INTRAVENOUS
  Administered 2016-10-27: 4 mg via INTRAVENOUS
  Administered 2016-10-27: 7.5 mg via INTRAVENOUS
  Administered 2016-10-27: 11.5 mg via INTRAVENOUS
  Administered 2016-10-27: 7.5 mg via INTRAVENOUS
  Administered 2016-10-27: 3 mg via INTRAVENOUS
  Administered 2016-10-28: 5 mg via INTRAVENOUS
  Administered 2016-10-28: 6.5 mg via INTRAVENOUS
  Administered 2016-10-28: 3 mg via INTRAVENOUS
  Administered 2016-10-28: 17:00:00 via INTRAVENOUS
  Administered 2016-10-28: 1 mg via INTRAVENOUS
  Administered 2016-10-28: 4.91 mg via INTRAVENOUS
  Administered 2016-10-28: 8.5 mg via INTRAVENOUS
  Administered 2016-10-29: 5.5 mg via INTRAVENOUS
  Administered 2016-10-29: 3.5 mg via INTRAVENOUS
  Administered 2016-10-29: 4.5 mg via INTRAVENOUS
  Administered 2016-10-29: 17:00:00 via INTRAVENOUS
  Administered 2016-10-29: 3 mg via INTRAVENOUS
  Administered 2016-10-29: 5 mg via INTRAVENOUS
  Administered 2016-10-30: 7.5 mg via INTRAVENOUS
  Administered 2016-10-30: 6.5 mg via INTRAVENOUS
  Administered 2016-10-30: 2.5 mg via INTRAVENOUS
  Administered 2016-10-30: 5.5 mg via INTRAVENOUS
  Filled 2016-10-26 (×4): qty 25

## 2016-10-26 NOTE — Progress Notes (Signed)
SICKLE CELL SERVICE PROGRESS NOTE  Brittany Foster:[id number] DOB: [birth year] DOA: 10/25/2016 PCP: Bing Neighbors, FNP  Assessment/Plan: Active Problems:   Hb-SS disease with crisis (HCC)  1. Hb SS with crisis: Continue and scheduled Percocet and PCA at current dose for PRN use. Continue Toradol and IVF.  2. Anemia of Chronic Disease: Hb stable 3. Depression: Continue Abilify and Cymbalta 4. Hypokalemia: Will re-check labs tomorrow.   Code Status: Full Code Family Communication: N/A Disposition Plan: Not yet ready for discharge  Brittany Maultsby A.  Pager [phone]. If 7PM-7AM, please contact night-coverage.  10/26/2016, 3:14 PM  LOS: 1 day   Interim History: Pt reports pain as 8/10 and localized to ribs, hips and legs. She has used 31.6 mg of Dilaudid  with 82/79:demands/deliveries on the PCA in the last 24 hours.   Consultants:  None  Procedures:  None  Antibiotics:  None   Objective: Vitals:   10/26/16 0800 10/26/16 1001 10/26/16 1200 10/26/16 1439  BP:  (!) 100/55  (!) 101/51  Pulse:  74  83  Resp: 19 18 15 17   Temp:  97.9 F (36.6 C)  98.4 F (36.9 C)  TempSrc:  Oral  Oral  SpO2: 95% 94% (!) 19% 94%  Weight:      Height:       Weight change:   Intake/Output Summary (Last 24 hours) at 10/26/16 1514 Last data filed at 10/26/16 0700  Gross per 24 hour  Intake          1708.33 ml  Output                0 ml  Net          1708.33 ml      Physical Exam General: Alert, awake, oriented x3, in no acute distress.  HEENT: [place]/AT PEERL, EOMI, anicteric Neck: Trachea midline,  no masses, no thyromegal,y no JVD, no carotid bruit OROPHARYNX:  Moist, No exudate/ erythema/lesions.  Heart: Regular rate and rhythm, without murmurs, rubs, gallops, PMI non-displaced, no heaves or thrills on palpation.  Lungs: Clear to auscultation, no wheezing or rhonchi noted. No increased vocal fremitus resonant to percussion  Abdomen: Soft, nontender, nondistended,  positive bowel sounds, no masses no hepatosplenomegaly noted..  Neuro: No focal neurological deficits noted cranial nerves II through XII grossly intact.  Strength at baseline in bilateral upper and lower extremities. Musculoskeletal: No warm swelling or erythema around joints, no spinal tenderness noted. Psychiatric: Patient alert and oriented x3, good insight and cognition, good recent to remote recall.    Data Reviewed: Basic Metabolic Panel: No results for input(s): NA, K, CL, CO2, GLUCOSE, BUN, CREATININE, CALCIUM, MG, PHOS in the last 168 hours. Liver Function Tests: No results for input(s): AST, ALT, ALKPHOS, BILITOT, PROT, ALBUMIN in the last 168 hours. No results for input(s): LIPASE, AMYLASE in the last 168 hours. No results for input(s): AMMONIA in the last 168 hours. CBC: No results for input(s): WBC, NEUTROABS, HGB, HCT, MCV, PLT in the last 168 hours. Cardiac Enzymes: No results for input(s): CKTOTAL, CKMB, CKMBINDEX, TROPONINI in the last 168 hours. BNP (last 3 results) No results for input(s): BNP in the last 8760 hours.  ProBNP (last 3 results) No results for input(s): PROBNP in the last 8760 hours.  CBG: No results for input(s): GLUCAP in the last 168 hours.  No results found for this or any previous visit (from the past 240 hour(s)).   Studies: No results found.  Scheduled Meds: . ARIPiprazole  10 mg Oral Daily  . DULoxetine  60 mg Oral Daily  . folic acid  1 mg Oral Daily  . HYDROmorphone   Intravenous Q4H  . hydroxyurea  1,500 mg Oral Daily  . ketorolac  30 mg Intravenous Q6H  . oxyCODONE-acetaminophen  1 tablet Oral Q4H   And  . oxyCODONE  5 mg Oral Q4H  . potassium chloride  40 mEq Oral BID  . senna-docusate  1 tablet Oral BID  . topiramate  50 mg Oral BID   Continuous Infusions: . dextrose 5 % and 0.45% NaCl 100 mL/hr at 10/26/16 0004    Active Problems:   Hb-SS disease with crisis (HCC)    In excess of 25 minutes spent during this visit.  Greater than 50% involved face to face contact with the patient for assessment, counseling and coordination of care.

## 2016-10-27 LAB — CBC WITH DIFFERENTIAL/PLATELET
BASOS ABS: 0 10*3/uL (ref 0.0–0.1)
Basophils Relative: 0 %
EOS ABS: 0.7 10*3/uL (ref 0.0–0.7)
Eosinophils Relative: 6 %
HCT: 20 % — ABNORMAL LOW (ref 36.0–46.0)
HEMOGLOBIN: 7.2 g/dL — AB (ref 12.0–15.0)
LYMPHS PCT: 32 %
Lymphs Abs: 3.9 10*3/uL (ref 0.7–4.0)
MCH: 35.3 pg — ABNORMAL HIGH (ref 26.0–34.0)
MCHC: 36 g/dL (ref 30.0–36.0)
MCV: 98 fL (ref 78.0–100.0)
MONO ABS: 0.6 10*3/uL (ref 0.1–1.0)
Monocytes Relative: 5 %
NEUTROS PCT: 57 %
Neutro Abs: 6.9 10*3/uL (ref 1.7–7.7)
PLATELETS: 263 10*3/uL (ref 150–400)
RBC: 2.04 MIL/uL — ABNORMAL LOW (ref 3.87–5.11)
RDW: 22 % — ABNORMAL HIGH (ref 11.5–15.5)
WBC: 12.1 10*3/uL — ABNORMAL HIGH (ref 4.0–10.5)

## 2016-10-27 LAB — RETICULOCYTES: RBC.: 2.04 MIL/uL — ABNORMAL LOW (ref 3.87–5.11)

## 2016-10-27 MED ORDER — SODIUM CHLORIDE 0.9 % IV SOLN
25.0000 mg | Freq: Once | INTRAVENOUS | Status: AC
  Administered 2016-10-27: 25 mg via INTRAVENOUS
  Filled 2016-10-27: qty 0.5

## 2016-10-27 NOTE — Care Management Note (Signed)
Case Management Note  Patient Details  Name: Brittany Foster MRN: [id number] Date of Birth: August 20, 1985  Subjective/Objective:    Pt admitted with Hb SS disease with crisis                Action/Plan: Dilaudid IV in an IVPB, plan to discharge home with no needs.   Expected Discharge Date:                  Expected Discharge Plan:  Home/Self Care  In-House Referral:     Discharge planning Services  CM Consult  Post Acute Care Choice:    Choice offered to:     DME Arranged:    DME Agency:     HH Arranged:  NA HH Agency:  NA  Status of Service:  Completed, signed off  If discussed at Long Length of Stay Meetings, dates discussed:    Additional CommentsGeni Bers, RN 10/27/2016, 12:01 PM

## 2016-10-28 NOTE — Progress Notes (Signed)
SICKLE CELL SERVICE PROGRESS NOTE  [name]:[id number] DOB: 03/22/1985 DOA: 10/25/2016 PCP: Bing NeighborsHarris, Kimberly S, FNP  Assessment/Plan: Active Problems:   Hb-SS disease with crisis (HCC)  1. Hb SS with Crisis: Continue Dilaudid PCA at the current dose, continue Toradol and decrease IV fluids this patient eating and drinking well. 2. Anemia of chronic disease: Hemoglobin has remained stable and is at 7.2 g/dL today. Patient has Foster robust reticulocytosis is no indication for transfusion. Patient has Foster robust reticulocytosis and thus no indication for transfusion. 3. History of depression: Continue Abilify and Cymbalta 4. Hypokalemia: Resolved. Continue oral substitution   Code Status: Full Code Family Communication: N/Foster Disposition Plan: Not yet ready for discharge  Brittany Foster,Brittany Foster.  Pager [phone]. If 7PM-7AM, please contact night-coverage.  10/28/2016, 3:28 PM  LOS: 3 days   Interim History: Patient has very pleasant affect today. However she still reports that her pain as 7-8 out of 10 and localized to her low back and legs. She still has not maximized use on the PCA and is encouraged to use the PCA more aggressively to control her pain. She has not walked since being admitted. And will attempt ambulation today. She had Foster bowel movement yesterday  Consultants:  None  Procedures:  None  Antibiotics:  None    Objective:   Physical Exam General: Alert, awake, oriented x3, in no acute distress.  HEENT: /AT PEERL, EOMI, anicteric Neck: Trachea midline,  no masses, no thyromegal,y no JVD, no carotid bruit OROPHARYNX:  Moist, No exudate/ erythema/lesions.  Heart: Regular rate and rhythm, without murmurs, rubs, gallops, PMI non-displaced, no heaves or thrills on palpation.  Lungs: Clear to auscultation, no wheezing or rhonchi noted. No increased vocal fremitus resonant to percussion  Abdomen: Soft, nontender, nondistended, positive bowel sounds, no masses no  hepatosplenomegaly noted.  Neuro: No focal neurological deficits noted cranial nerves II through XII grossly intact.  Strength at functional baseline in bilateral upper and lower extremities. Musculoskeletal: No warmth swelling or erythema around joints, no spinal tenderness noted. Psychiatric: Patient alert and oriented x3, good insight and cognition, good recent to remote recall.    Data Reviewed: Basic Metabolic Panel:  Recent Labs Lab 10/26/16 1823  NA 138  K 4.3  CL 113*  CO2 22  GLUCOSE 92  BUN 7  CREATININE 0.40*  CALCIUM 8.4*  MG 1.8   Liver Function Tests: No results for input(s): AST, ALT, ALKPHOS, BILITOT, PROT, ALBUMIN in the last 168 hours. No results for input(s): LIPASE, AMYLASE in the last 168 hours. No results for input(s): AMMONIA in the last 168 hours. CBC:  Recent Labs Lab 10/27/16 0523  WBC 12.1*  NEUTROABS 6.9  HGB 7.2*  HCT 20.0*  MCV 98.0  PLT 263   Cardiac Enzymes: No results for input(s): CKTOTAL, CKMB, CKMBINDEX, TROPONINI in the last 168 hours. BNP (last 3 results) No results for input(s): BNP in the last 8760 hours.  ProBNP (last 3 results) No results for input(s): PROBNP in the last 8760 hours.  CBG: No results for input(s): GLUCAP in the last 168 hours.  No results found for this or any previous visit (from the past 240 hour(s)).   Studies: No results found.  Scheduled Meds: . ARIPiprazole  10 mg Oral Daily  . DULoxetine  60 mg Oral Daily  . folic acid  1 mg Oral Daily  . HYDROmorphone   Intravenous Q4H  . hydroxyurea  1,500 mg Oral Daily  . ketorolac  30 mg Intravenous Q6H  .  oxyCODONE-acetaminophen  1 tablet Oral Q4H   And  . oxyCODONE  5 mg Oral Q4H  . potassium chloride  40 mEq Oral BID  . senna-docusate  1 tablet Oral BID  . topiramate  50 mg Oral BID   Continuous Infusions: . dextrose 5 % and 0.45% NaCl 10 mL/hr at 10/28/16 0605    Active Problems:   Hb-SS disease with crisis (HCC)     In excess of 25  minutes spent during this visit. Greater than 50% involved face to face contact with the patient for assessment, counseling and coordination of care.

## 2016-10-28 NOTE — Progress Notes (Signed)
SICKLE CELL SERVICE PROGRESS NOTE  Perel Hauschild ZDG:[id number] DOB: August 17, 1985 DOA: 10/25/2016 PCP: Bing Neighbors, FNP  Assessment/Plan: Active Problems:   Hb-SS disease with crisis (HCC)  1. Hb SS with crisis: Continue scheduled Percocet and PCA at current dose for PRN use. Continue Toradol and IVF. Encourage increased mobility. 2. Anemia of Chronic Disease: Hb stable 3. Depression: Continue Abilify and Cymbalta 4. Hypokalemia: Resolved   Code Status: Full Code Family Communication: N/A Disposition Plan: Not yet ready for discharge  [name],[name]  Pager [phone]. If 7PM-7AM, please contact night-coverage.  10/28/2016, 3:29 PM  LOS: 3 days   Interim History: Pt reports pain as 8/10 and localized to ribs, hips and legs. She has used 30 mg of Dilaudid  with 73/60:demands/deliveries on the PCA in the last 24 hours. Had a BM yesterday.  Consultants:  None  Procedures:  None  Antibiotics:  None   Objective: Vitals:   10/28/16 0635 10/28/16 0742 10/28/16 1040 10/28/16 1351  BP: (!) 105/57  (!) 94/58   Pulse: 82  93   Resp: 14 13 14 12   Temp: 97.9 F (36.6 C)  98.6 F (37 C)   TempSrc: Oral  Oral   SpO2: 94% 92% 92% 94%  Weight:      Height:       Weight change:   Intake/Output Summary (Last 24 hours) at 10/28/16 1529 Last data filed at 10/28/16 0600  Gross per 24 hour  Intake              150 ml  Output                0 ml  Net              150 ml      Physical Exam General: Alert, awake, oriented x3, in no acute distress.  HEENT: [place]/AT PEERL, EOMI, anicteric Neck: Trachea midline,  no masses, no thyromegal,y no JVD, no carotid bruit OROPHARYNX:  Moist, No exudate/ erythema/lesions.  Heart: Regular rate and rhythm, without murmurs, rubs, gallops, PMI non-displaced, no heaves or thrills on palpation.  Lungs: Clear to auscultation, no wheezing or rhonchi noted. No increased vocal fremitus resonant to percussion  Abdomen: Soft, nontender,  nondistended, positive bowel sounds, no masses no hepatosplenomegaly noted..  Neuro: No focal neurological deficits noted cranial nerves II through XII grossly intact.  Strength at baseline in bilateral upper and lower extremities. Musculoskeletal: No warm swelling or erythema around joints, no spinal tenderness noted. Psychiatric: Patient alert and oriented x3, good insight and cognition, good recent to remote recall.    Data Reviewed: Basic Metabolic Panel:  Recent Labs Lab 10/26/16 1823  NA 138  K 4.3  CL 113*  CO2 22  GLUCOSE 92  BUN 7  CREATININE 0.40*  CALCIUM 8.4*  MG 1.8   Liver Function Tests: No results for input(s): AST, ALT, ALKPHOS, BILITOT, PROT, ALBUMIN in the last 168 hours. No results for input(s): LIPASE, AMYLASE in the last 168 hours. No results for input(s): AMMONIA in the last 168 hours. CBC:  Recent Labs Lab 10/27/16 0523  WBC 12.1*  NEUTROABS 6.9  HGB 7.2*  HCT 20.0*  MCV 98.0  PLT 263   Cardiac Enzymes: No results for input(s): CKTOTAL, CKMB, CKMBINDEX, TROPONINI in the last 168 hours. BNP (last 3 results) No results for input(s): BNP in the last 8760 hours.  ProBNP (last 3 results) No results for input(s): PROBNP in the last 8760 hours.  CBG: No results  for input(s): GLUCAP in the last 168 hours.  No results found for this or any previous visit (from the past 240 hour(s)).   Studies: No results found.  Scheduled Meds: . ARIPiprazole  10 mg Oral Daily  . DULoxetine  60 mg Oral Daily  . folic acid  1 mg Oral Daily  . HYDROmorphone   Intravenous Q4H  . hydroxyurea  1,500 mg Oral Daily  . ketorolac  30 mg Intravenous Q6H  . oxyCODONE-acetaminophen  1 tablet Oral Q4H   And  . oxyCODONE  5 mg Oral Q4H  . potassium chloride  40 mEq Oral BID  . senna-docusate  1 tablet Oral BID  . topiramate  50 mg Oral BID   Continuous Infusions: . dextrose 5 % and 0.45% NaCl 10 mL/hr at 10/28/16 0605    Active Problems:   Hb-SS disease with  crisis (HCC)    In excess of 25 minutes spent during this visit. Greater than 50% involved face to face contact with the patient for assessment, counseling and coordination of care.

## 2016-10-29 LAB — CBC WITH DIFFERENTIAL/PLATELET
BASOS PCT: 1 %
Basophils Absolute: 0.1 10*3/uL (ref 0.0–0.1)
EOS PCT: 5 %
Eosinophils Absolute: 0.5 10*3/uL (ref 0.0–0.7)
HEMATOCRIT: 19.7 % — AB (ref 36.0–46.0)
HEMOGLOBIN: 7.2 g/dL — AB (ref 12.0–15.0)
Lymphocytes Relative: 37 %
Lymphs Abs: 3.3 10*3/uL (ref 0.7–4.0)
MCH: 36.5 pg — AB (ref 26.0–34.0)
MCHC: 36.5 g/dL — ABNORMAL HIGH (ref 30.0–36.0)
MCV: 100 fL (ref 78.0–100.0)
MONO ABS: 0.5 10*3/uL (ref 0.1–1.0)
MONOS PCT: 5 %
NEUTROS PCT: 52 %
Neutro Abs: 4.6 10*3/uL (ref 1.7–7.7)
Platelets: 230 10*3/uL (ref 150–400)
RBC: 1.97 MIL/uL — AB (ref 3.87–5.11)
RDW: 22.6 % — ABNORMAL HIGH (ref 11.5–15.5)
WBC: 9 10*3/uL (ref 4.0–10.5)

## 2016-10-29 LAB — RETICULOCYTES
RBC.: 1.97 MIL/uL — ABNORMAL LOW (ref 3.87–5.11)
RETIC COUNT ABSOLUTE: 386.1 10*3/uL — AB (ref 19.0–186.0)
Retic Ct Pct: 19.6 % — ABNORMAL HIGH (ref 0.4–3.1)

## 2016-10-29 MED ORDER — METOCLOPRAMIDE HCL 5 MG/ML IJ SOLN
5.0000 mg | Freq: Once | INTRAMUSCULAR | Status: DC
  Filled 2016-10-29: qty 2

## 2016-10-29 NOTE — Progress Notes (Signed)
Patient ID: Brittany Foster, female   DOB: 09-04-1985, 31 y.o.   MRN: [id number] Subjective: Pain remains at 8/10 although she appears comfortable. She has no fever, no chest pain, no headache.  Objective:  Vital signs in last 24 hours:  Vitals:   10/29/16 1200 10/29/16 1515 10/29/16 1553 10/29/16 1704  BP:  (!) 90/57    Pulse:  (!) 101    Resp: 12 12 17 14   Temp:  99 F (37.2 C)    TempSrc:  Oral    SpO2: 95% 92% 96% 97%  Weight:      Height:       Intake/Output from previous day:   Intake/Output Summary (Last 24 hours) at 10/29/16 1835 Last data filed at 10/29/16 1406  Gross per 24 hour  Intake              729 ml  Output                0 ml  Net              729 ml    Physical Exam: General: Alert, awake, oriented x3, in no acute distress.  HEENT: [place]/AT PEERL, EOMI Neck: Trachea midline,  no masses, no thyromegal,y no JVD, no carotid bruit OROPHARYNX:  Moist, No exudate/ erythema/lesions.  Heart: Regular rate and rhythm, without murmurs, rubs, gallops, PMI non-displaced, no heaves or thrills on palpation.  Lungs: Clear to auscultation, no wheezing or rhonchi noted. No increased vocal fremitus resonant to percussion  Abdomen: Soft, nontender, nondistended, positive bowel sounds, no masses no hepatosplenomegaly noted..  Neuro: No focal neurological deficits noted cranial nerves II through XII grossly intact. DTRs 2+ bilaterally upper and lower extremities. Strength 5 out of 5 in bilateral upper and lower extremities. Musculoskeletal: No warm swelling or erythema around joints, no spinal tenderness noted. Psychiatric: Patient alert and oriented x3, good insight and cognition, good recent to remote recall. Lymph node survey: No cervical axillary or inguinal lymphadenopathy noted.  Lab Results:  Basic Metabolic Panel:    Component Value Date/Time   NA 138 10/26/2016 1823   K 4.3 10/26/2016 1823   CL 113 (H) 10/26/2016 1823   CO2 22 10/26/2016 1823   BUN 7 10/26/2016 1823    CREATININE 0.40 (L) 10/26/2016 1823   GLUCOSE 92 10/26/2016 1823   CALCIUM 8.4 (L) 10/26/2016 1823   CBC:    Component Value Date/Time   WBC 9.0 10/29/2016 0456   HGB 7.2 (L) 10/29/2016 0456   HCT 19.7 (L) 10/29/2016 0456   PLT 230 10/29/2016 0456   MCV 100.0 10/29/2016 0456   NEUTROABS 4.6 10/29/2016 0456   LYMPHSABS 3.3 10/29/2016 0456   MONOABS 0.5 10/29/2016 0456   EOSABS 0.5 10/29/2016 0456   BASOSABS 0.1 10/29/2016 0456    No results found for this or any previous visit (from the past 240 hour(s)).  Studies/Results: No results found.  Medications: Scheduled Meds: . ARIPiprazole  10 mg Oral Daily  . DULoxetine  60 mg Oral Daily  . folic acid  1 mg Oral Daily  . HYDROmorphone   Intravenous Q4H  . hydroxyurea  1,500 mg Oral Daily  . ketorolac  30 mg Intravenous Q6H  . oxyCODONE-acetaminophen  1 tablet Oral Q4H   And  . oxyCODONE  5 mg Oral Q4H  . potassium chloride  40 mEq Oral BID  . senna-docusate  1 tablet Oral BID  . topiramate  50 mg Oral BID   Continuous Infusions: .  dextrose 5 % and 0.45% NaCl 10 mL/hr at 10/28/16 0605   PRN Meds:.diphenhydrAMINE **OR** [DISCONTINUED] diphenhydrAMINE (BENADRYL) IVPB(SICKLE CELL ONLY), naloxone **AND** sodium chloride flush, polyethylene glycol, promethazine, sodium chloride flush  Consultants:  None  Procedures:  None  Antibiotics:  None  Assessment/Plan: Active Problems:   Hb-SS disease with crisis (HCC)  1. Hb SS with crisis: Continue and scheduled Percocet and Dilaudid via PCA at current dose for PRN use. Continue Toradol and IVF.  2. Anemia of Chronic Disease: Hb stable 3. Depression: Continue Abilify and Cymbalta 4. Hypokalemia: Replenished.  Code Status: Full Code Family Communication: N/A Disposition Plan: For possible discharge tomorrow  Eriyonna Matsushita  If 7PM-7AM, please contact night-coverage.  10/29/2016, 6:35 PM  LOS: 4 days

## 2016-10-30 LAB — BASIC METABOLIC PANEL
Anion gap: 5 (ref 5–15)
BUN: 10 mg/dL (ref 6–20)
CHLORIDE: 113 mmol/L — AB (ref 101–111)
CO2: 24 mmol/L (ref 22–32)
CREATININE: 0.61 mg/dL (ref 0.44–1.00)
Calcium: 8.6 mg/dL — ABNORMAL LOW (ref 8.9–10.3)
Glucose, Bld: 109 mg/dL — ABNORMAL HIGH (ref 65–99)
POTASSIUM: 4.6 mmol/L (ref 3.5–5.1)
SODIUM: 142 mmol/L (ref 135–145)

## 2016-10-30 LAB — CBC WITH DIFFERENTIAL/PLATELET
BAND NEUTROPHILS: 6 %
BASOS PCT: 0 %
Basophils Absolute: 0 10*3/uL (ref 0.0–0.1)
Blasts: 0 %
EOS ABS: 0.3 10*3/uL (ref 0.0–0.7)
Eosinophils Relative: 3 %
HCT: 20 % — ABNORMAL LOW (ref 36.0–46.0)
HEMOGLOBIN: 7.2 g/dL — AB (ref 12.0–15.0)
LYMPHS ABS: 3 10*3/uL (ref 0.7–4.0)
Lymphocytes Relative: 34 %
MCH: 36.5 pg — ABNORMAL HIGH (ref 26.0–34.0)
MCHC: 36 g/dL (ref 30.0–36.0)
MCV: 101.5 fL — ABNORMAL HIGH (ref 78.0–100.0)
MONO ABS: 0.5 10*3/uL (ref 0.1–1.0)
MYELOCYTES: 0 %
Metamyelocytes Relative: 0 %
Monocytes Relative: 6 %
Neutro Abs: 5.1 10*3/uL (ref 1.7–7.7)
Neutrophils Relative %: 51 %
Other: 0 %
PLATELETS: 228 10*3/uL (ref 150–400)
PROMYELOCYTES ABS: 0 %
RBC: 1.97 MIL/uL — ABNORMAL LOW (ref 3.87–5.11)
RDW: 24.2 % — ABNORMAL HIGH (ref 11.5–15.5)
WBC: 8.9 10*3/uL (ref 4.0–10.5)
nRBC: 0 /100 WBC

## 2016-10-30 MED ORDER — MORPHINE SULFATE ER 30 MG PO TBCR: 30.0000 mg | EXTENDED_RELEASE_TABLET | Freq: Two times a day (BID) | ORAL | 0 refills | Status: DC

## 2016-10-30 MED ORDER — HYDROMORPHONE HCL 2 MG/ML IJ SOLN
2.0000 mg | Freq: Once | INTRAMUSCULAR | Status: AC
  Administered 2016-10-30: 2 mg via INTRAVENOUS
  Filled 2016-10-30: qty 1

## 2016-10-30 MED ORDER — OXYCODONE-ACETAMINOPHEN 10-325 MG PO TABS: 1.0000 | ORAL_TABLET | Freq: Four times a day (QID) | ORAL | 0 refills | Status: DC | PRN

## 2016-10-30 MED ORDER — HEPARIN SOD (PORK) LOCK FLUSH 100 UNIT/ML IV SOLN
250.0000 [IU] | INTRAVENOUS | Status: AC | PRN
  Administered 2016-10-30: 250 [IU]
  Filled 2016-10-30: qty 5

## 2016-10-30 NOTE — Discharge Instructions (Signed)
Sickle Cell Anemia, Adult °Sickle cell anemia is a condition in which red blood cells have an abnormal “sickle” shape. This abnormal shape shortens the cells’ life span, which results in a lower than normal concentration of red blood cells in the blood. The sickle shape also causes the cells to clump together and block free blood flow through the blood vessels. As a result, the tissues and organs of the body do not receive enough oxygen. Sickle cell anemia causes organ damage and pain and increases the risk of infection. °What are the causes? °Sickle cell anemia is a genetic disorder. Those who receive two copies of the gene have the condition, and those who receive one copy have the trait. °What increases the risk? °The sickle cell gene is most common in people whose families originated in [place]. Other areas of the globe where sickle cell trait occurs include the Mediterranean, [place] and [place], the [place], and the [place]. °What are the signs or symptoms? °· Pain, especially in the extremities, back, chest, or abdomen (common). The pain may start suddenly or may develop following an illness, especially if there is dehydration. Pain can also occur due to overexertion or exposure to extreme temperature changes. °· Frequent severe bacterial infections, especially certain types of pneumonia and meningitis. °· Pain and swelling in the hands and feet. °· Decreased activity. °· Loss of appetite. °· Change in behavior. °· Headaches. °· Seizures. °· Shortness of breath or difficulty breathing. °· Vision changes. °· Skin ulcers. °Those with the trait may not have symptoms or they may have mild symptoms. °How is this diagnosed? °Sickle cell anemia is diagnosed with blood tests that demonstrate the genetic trait. It is often diagnosed during the newborn period, due to mandatory testing nationwide. A variety of blood tests, X-rays, CT scans, MRI scans, ultrasounds, and lung function tests may also be done to  monitor the condition. °How is this treated? °Sickle cell anemia may be treated with: °· Medicines. You may be given pain medicines, antibiotic medicines (to treat and prevent infections) or medicines to increase the production of certain types of hemoglobin. °· Fluids. °· Oxygen. °· Blood transfusions. ° °Follow these instructions at home: °· Drink enough fluid to keep your urine clear or pale yellow. Increase your fluid intake in hot weather and during exercise. °· Do not smoke. Smoking lowers oxygen levels in the blood. °· Only take over-the-counter or prescription medicines for pain, fever, or discomfort as directed by your health care provider. °· Take antibiotics as directed by your health care provider. Make sure you finish them it even if you start to feel better. °· Take supplements as directed by your health care provider. °· Consider wearing a medical alert bracelet. This tells anyone caring for you in an emergency of your condition. °· When traveling, keep your medical information, health care provider's names, and the medicines you take with you at all times. °· If you develop a fever, do not take medicines to reduce the fever right away. This could cover up a problem that is developing. Notify your health care provider. °· Keep all follow-up appointments with your health care provider. Sickle cell anemia requires regular medical care. °Contact a health care provider if: °You have a fever. °Get help right away if: °· You feel dizzy or faint. °· You have new abdominal pain, especially on the left side near the stomach area. °· You develop a persistent, often uncomfortable and painful penile erection (priapism). If this is not   treated immediately it will lead to impotence. °· You have numbness your arms or legs or you have a hard time moving them. °· You have a hard time with speech. °· You have a fever or persistent symptoms for more than 2-3 days. °· You have a fever and your symptoms suddenly get  worse. °· You have signs or symptoms of infection. These include: °? Chills. °? Abnormal tiredness (lethargy). °? Irritability. °? Poor eating. °? Vomiting. °· You develop pain that is not helped with medicine. °· You develop shortness of breath. °· You have pain in your chest. °· You are coughing up pus-like or bloody sputum. °· You develop a stiff neck. °· Your feet or hands swell or have pain. °· Your abdomen appears bloated. °· You develop joint pain. °This information is not intended to replace advice given to you by your health care provider. Make sure you discuss any questions you have with your health care provider. °Document Released: 05/27/2005 Document Revised: 09/06/2015 Document Reviewed: 09/28/2012 °Elsevier Interactive Patient Education © 2017 [employer]. ° °

## 2016-10-30 NOTE — Discharge Summary (Signed)
Physician Discharge Summary  Brittany Foster ZOX:[id number] DOB: 01/30/1986 DOA: 10/25/2016  PCP: Bing NeighborsHarris, Brittany S, FNP  Admit date: 10/25/2016  Discharge date: 10/30/2016  Discharge Diagnoses:  Active Problems:   Hb-SS disease with crisis [facility](HCC)  Discharge Condition: Stable  Disposition:  Follow-up Information    Bing NeighborsHarris, Brittany S, FNP Follow up in 1 week(Foster).   Specialty:  Family Medicine Contact information: [address] [place] [address] [phone]          Pt is discharged home in good condition and is to follow up with Brittany PeaHarris, Brittany PickKimberly S, FNP this week to have labs evaluated. He is instructed to increase activity slowly and balance with rest for the next few days, and use prescribed medication to complete treatment of pain  Diet: Regular  Wt Readings from Last 3 Encounters:  10/30/16 58 kg (127 lb 13.9 oz)  10/15/16 60.3 kg (133 lb)  10/14/16 59.9 kg (132 lb)   History of present illness:  This is an opiate naive patient with Hb SS who presented to OSH where she was assessed as having sickle cell crisis without complications. [facility] and has just [facility].   Pt reports pain in BLE'Foster, B/L hips,and left ribcage at an intensity of 9/10. She reports that she took up to 4 Percocet  10-325 mg yesterday with no relief of her pain. Pt has not had a provider to prescribe Opiate on a chronic basis and reports that she has been taking opiates only sparingly over the last several months. She reports that her Hydrea was recently increased to 1500 mg daily by Dr. Hyacinth Foster at [facility] in [place] (verified in review of Care Everywhere records).  She denies any fevers, chills, SOB, Vomiting diarrhea, cough, dizziness, HA or weakness.   At the OSH she received Dilaudid IV in an IVPB. A review of her records from the OSH shows that a D-dimer done because of ribcage pain was elevated but a subsequent CTA of the chest was negative for PE.   Hospital Course:  Patient was  admitted for uncomplicated sickle cell pain crisis which was successfully managed with Dilaudid via PCA, IV Toradol and IVF. She was transitioned to PO pain medication as pain improved. She remained hemodynamically stable throughout admission. Hypokalemia was treated with oral K, potassium returned to normal, Hb was stable throughout admission. Patient does not have her home pain medication which was the main reason behind her recent frequent hospitalizations and multiple ED visit to various hospital ED in and outside of the [place]. Patient claims if she has her medications, she would be able to manage her pain at home. Therefore she was discharged home with 2 weeks prescription of percocet and MS. Contin. She has a scheduled appointment with her hematologist who also manages her pain in about 2 weeks. Patient will follow up with her PCP within one week of this discharge.  Discharge Exam: Vitals:   10/30/16 1039 10/30/16 1446  BP:    Pulse:    Resp: 16 17  Temp:    SpO2: 93% 90%   Vitals:   10/30/16 0524 10/30/16 0930 10/30/16 1039 10/30/16 1446  BP:  112/66    Pulse:  97    Resp: 13 13 16 17   Temp:  98.8 F (37.1 C)    TempSrc:  Oral    SpO2: 94% 94% 93% 90%  Weight:      Height:        General appearance : Awake, alert,  not in any distress. Speech Clear. Not toxic looking HEENT: Atraumatic and Normocephalic, pupils equally reactive to light and accomodation Neck: Supple, no JVD. No cervical lymphadenopathy.  Chest: Good air entry bilaterally, no added sounds  CVS: S1 S2 regular, no murmurs.  Abdomen: Bowel sounds present, Non tender and not distended with no gaurding, rigidity or rebound. Extremities: B/L Lower Ext shows no edema, both legs are warm to touch Neurology: Awake alert, and oriented X 3, CN II-XII intact, Non focal Skin: No Rash  Discharge Instructions  Discharge Instructions    Diet - low sodium heart healthy    Complete by:  As directed    Increase activity  slowly    Complete by:  As directed      Allergies as of 10/30/2016      Reactions   Ultram [tramadol] Other (See Comments)   Reaction:  Seizures    Zofran [ondansetron Hcl] Nausea And Vomiting   Buprenorphine Hcl Hives, Other (See Comments)   Reaction:  Shaking    Fentanyl Hives   Morphine And Related Hives, Other (See Comments)   Reaction:  Shaking    Tape Rash      Medication List    TAKE these medications   ARIPiprazole 10 MG tablet Commonly known as:  ABILIFY Take 10 mg by mouth daily   DULoxetine 60 MG capsule Commonly known as:  CYMBALTA TAKE 1 CAPSULE BY MOUTH DAILY.   folic acid 1 MG tablet Commonly known as:  FOLVITE Take 1 tablet (1 mg total) by mouth daily.   hydroxyurea 500 MG capsule Commonly known as:  HYDREA TAKE 2 CAPSULES BY MOUTH DAILY. MAY TAKE WITH FOOD TO MINIMIZE GI SIDE EFFECTS. What changed:  how much to take  how to take this  when to take this  additional instructions   ibuprofen 200 MG tablet Commonly known as:  ADVIL,MOTRIN Take 400 mg by mouth every 6 (six) hours as needed.   medroxyPROGESTERone 150 MG/ML injection Commonly known as:  DEPO-PROVERA Inject 150 mg into the muscle every 3 (three) months.   morphine 30 MG 12 hr tablet Commonly known as:  MS CONTIN Take 1 tablet (30 mg total) by mouth every 12 (twelve) hours.   oxyCODONE-acetaminophen 10-325 MG tablet Commonly known as:  PERCOCET Take 1 tablet by mouth every 6 (six) hours as needed for pain. for pain What changed:  when to take this  additional instructions   Topiramate ER 100 MG Cp24 Commonly known as:  TROKENDI XR Take 200 mg by mouth at bedtime. What changed:  how much to take            Discharge Care Instructions        Start     Ordered   10/30/16 0000  oxyCODONE-acetaminophen (PERCOCET) 10-325 MG tablet  Every 6 hours PRN     10/30/16 1522   10/30/16 0000  morphine (MS CONTIN) 30 MG 12 hr tablet  Every 12 hours     10/30/16 1522    10/30/16 0000  Increase activity slowly     10/30/16 1522   10/30/16 0000  Diet - low sodium heart healthy     10/30/16 1522      The results of significant diagnostics from this hospitalization (including imaging, microbiology, ancillary and laboratory) are listed below for reference.    Significant Diagnostic Studies: No results found.  Microbiology: No results found for this or any previous visit (from the past 240 hour(Foster)).   Labs: Basic  Metabolic Panel:  Recent Labs Lab 10/26/16 1823 10/30/16 0500  NA 138 142  K 4.3 4.6  CL 113* 113*  CO2 22 24  GLUCOSE 92 109*  BUN 7 10  CREATININE 0.40* 0.61  CALCIUM 8.4* 8.6*  MG 1.8  --    Liver Function Tests: No results for input(Foster): AST, ALT, ALKPHOS, BILITOT, PROT, ALBUMIN in the last 168 hours. No results for input(Foster): LIPASE, AMYLASE in the last 168 hours. No results for input(Foster): AMMONIA in the last 168 hours. CBC:  Recent Labs Lab 10/27/16 0523 10/29/16 0456 10/30/16 0500  WBC 12.1* 9.0 8.9  NEUTROABS 6.9 4.6 5.1  HGB 7.2* 7.2* 7.2*  HCT 20.0* 19.7* 20.0*  MCV 98.0 100.0 101.5*  PLT 263 230 228   Cardiac Enzymes: No results for input(Foster): CKTOTAL, CKMB, CKMBINDEX, TROPONINI in the last 168 hours. BNP: Invalid input(Foster): POCBNP CBG: No results for input(Foster): GLUCAP in the last 168 hours.  Time coordinating discharge: 50 minutes  Signed:  Jeanann Lewandowsky  [facility] 10/30/2016, 3:24 PM

## 2016-10-30 NOTE — Progress Notes (Signed)
Discharge instructions reviewed with patient. Patient verbalized understanding. Patient discharged via private vehicle. 

## 2016-11-05 ENCOUNTER — Telehealth (HOSPITAL_COMMUNITY): Payer: Self-pay | Admitting: *Deleted

## 2016-11-05 NOTE — Telephone Encounter (Signed)
[facility]. Pt states her pain is in her legs and hips. She states her pain is 6/10. She denies fever, chest pain, nausea, vomiting, diarrhea or abd pain. She last took her Oxy and Ms at 5 am and has been taking them around the clock. Will check with the provider and give her a call back.Pt voiced understanding.

## 2016-11-14 ENCOUNTER — Inpatient Hospital Stay (HOSPITAL_COMMUNITY): Payer: Medicaid Other

## 2016-11-14 ENCOUNTER — Inpatient Hospital Stay (HOSPITAL_COMMUNITY)
Admission: EM | Admit: 2016-11-14 | Discharge: 2016-11-21 | DRG: 812 | Disposition: A | Discharge status: Home / Self Care | Payer: Medicaid Other | Attending: Internal Medicine | Admitting: Internal Medicine

## 2016-11-14 ENCOUNTER — Encounter (HOSPITAL_COMMUNITY): Payer: Self-pay | Admitting: *Deleted

## 2016-11-14 DIAGNOSIS — D638 Anemia in other chronic diseases classified elsewhere: Secondary | ICD-10-CM | POA: Diagnosis present

## 2016-11-14 DIAGNOSIS — D5700 Hb-SS disease with crisis, unspecified: Secondary | ICD-10-CM | POA: Diagnosis present

## 2016-11-14 DIAGNOSIS — D588 Other specified hereditary hemolytic anemias: Secondary | ICD-10-CM

## 2016-11-14 DIAGNOSIS — Z885 Allergy status to narcotic agent status: Secondary | ICD-10-CM | POA: Diagnosis not present

## 2016-11-14 DIAGNOSIS — Z79891 Long term (current) use of opiate analgesic: Secondary | ICD-10-CM | POA: Diagnosis not present

## 2016-11-14 DIAGNOSIS — Z832 Family history of diseases of the blood and blood-forming organs and certain disorders involving the immune mechanism: Secondary | ICD-10-CM

## 2016-11-14 DIAGNOSIS — F54 Psychological and behavioral factors associated with disorders or diseases classified elsewhere: Secondary | ICD-10-CM | POA: Diagnosis not present

## 2016-11-14 DIAGNOSIS — Z79899 Other long term (current) drug therapy: Secondary | ICD-10-CM

## 2016-11-14 DIAGNOSIS — F329 Major depressive disorder, single episode, unspecified: Secondary | ICD-10-CM | POA: Diagnosis not present

## 2016-11-14 DIAGNOSIS — Z91048 Other nonmedicinal substance allergy status: Secondary | ICD-10-CM | POA: Diagnosis not present

## 2016-11-14 DIAGNOSIS — F17210 Nicotine dependence, cigarettes, uncomplicated: Secondary | ICD-10-CM | POA: Diagnosis present

## 2016-11-14 DIAGNOSIS — E876 Hypokalemia: Secondary | ICD-10-CM | POA: Diagnosis present

## 2016-11-14 DIAGNOSIS — Z888 Allergy status to other drugs, medicaments and biological substances status: Secondary | ICD-10-CM | POA: Diagnosis not present

## 2016-11-14 DIAGNOSIS — G894 Chronic pain syndrome: Secondary | ICD-10-CM | POA: Diagnosis present

## 2016-11-14 DIAGNOSIS — R079 Chest pain, unspecified: Secondary | ICD-10-CM | POA: Diagnosis not present

## 2016-11-14 DIAGNOSIS — R5381 Other malaise: Secondary | ICD-10-CM | POA: Diagnosis not present

## 2016-11-14 DIAGNOSIS — R269 Unspecified abnormalities of gait and mobility: Secondary | ICD-10-CM | POA: Diagnosis not present

## 2016-11-14 DIAGNOSIS — R5383 Other fatigue: Secondary | ICD-10-CM | POA: Diagnosis not present

## 2016-11-14 DIAGNOSIS — F339 Major depressive disorder, recurrent, unspecified: Secondary | ICD-10-CM | POA: Diagnosis present

## 2016-11-14 MED ORDER — HYDROXYUREA 500 MG PO CAPS
1500.0000 mg | ORAL_CAPSULE | Freq: Every day | ORAL | Status: DC
  Administered 2016-11-15 – 2016-11-21 (×7): 1500 mg via ORAL
  Filled 2016-11-14 (×8): qty 3

## 2016-11-14 MED ORDER — TOPIRAMATE 25 MG PO TABS
50.0000 mg | ORAL_TABLET | Freq: Two times a day (BID) | ORAL | Status: DC
Start: 2016-11-14 — End: 2016-11-21
  Administered 2016-11-14 – 2016-11-21 (×14): 50 mg via ORAL
  Filled 2016-11-14 (×12): qty 2

## 2016-11-14 MED ORDER — KETOROLAC TROMETHAMINE 15 MG/ML IJ SOLN
15.0000 mg | Freq: Four times a day (QID) | INTRAMUSCULAR | Status: DC
  Administered 2016-11-15 – 2016-11-16 (×6): 15 mg via INTRAVENOUS
  Filled 2016-11-14 (×6): qty 1

## 2016-11-14 MED ORDER — FOLIC ACID 1 MG PO TABS
1.0000 mg | ORAL_TABLET | Freq: Every day | ORAL | Status: DC
  Administered 2016-11-14 – 2016-11-21 (×8): 1 mg via ORAL
  Filled 2016-11-14 (×8): qty 1

## 2016-11-14 MED ORDER — SODIUM CHLORIDE 0.9% FLUSH: 9.0000 mL | INTRAVENOUS | Status: DC | PRN

## 2016-11-14 MED ORDER — PROMETHAZINE HCL 25 MG/ML IJ SOLN
12.5000 mg | Freq: Four times a day (QID) | INTRAMUSCULAR | Status: DC | PRN
  Administered 2016-11-15 – 2016-11-21 (×24): 12.5 mg via INTRAVENOUS
  Filled 2016-11-14 (×26): qty 1

## 2016-11-14 MED ORDER — SENNOSIDES-DOCUSATE SODIUM 8.6-50 MG PO TABS
1.0000 | ORAL_TABLET | Freq: Two times a day (BID) | ORAL | Status: DC
  Administered 2016-11-14 – 2016-11-20 (×4): 1 via ORAL
  Filled 2016-11-14 (×10): qty 1

## 2016-11-14 MED ORDER — ENOXAPARIN SODIUM 40 MG/0.4ML ~~LOC~~ SOLN
40.0000 mg | Freq: Every day | SUBCUTANEOUS | Status: DC
  Administered 2016-11-16: 40 mg via SUBCUTANEOUS
  Filled 2016-11-14 (×4): qty 0.4

## 2016-11-14 MED ORDER — DEXTROSE-NACL 5-0.45 % IV SOLN
INTRAVENOUS | Status: DC
  Administered 2016-11-14 – 2016-11-17 (×7): via INTRAVENOUS

## 2016-11-14 MED ORDER — DULOXETINE HCL 30 MG PO CPEP
60.0000 mg | ORAL_CAPSULE | Freq: Every day | ORAL | Status: DC
  Administered 2016-11-14 – 2016-11-21 (×8): 60 mg via ORAL
  Filled 2016-11-14 (×8): qty 2

## 2016-11-14 MED ORDER — POLYETHYLENE GLYCOL 3350 17 G PO PACK: 17.0000 g | PACK | Freq: Every day | ORAL | Status: DC | PRN

## 2016-11-14 MED ORDER — HYDROMORPHONE HCL 1 MG/ML IJ SOLN
1.0000 mg | Freq: Once | INTRAMUSCULAR | Status: AC
  Administered 2016-11-14: 1 mg via INTRAVENOUS
  Filled 2016-11-14: qty 1

## 2016-11-14 MED ORDER — NALOXONE HCL 0.4 MG/ML IJ SOLN: 0.4000 mg | INTRAMUSCULAR | Status: DC | PRN

## 2016-11-14 MED ORDER — HYDROMORPHONE 1 MG/ML IV SOLN
INTRAVENOUS | Status: DC
  Administered 2016-11-14: 1 mg via INTRAVENOUS
  Administered 2016-11-15: 3.2 mg via INTRAVENOUS
  Administered 2016-11-15: 4.6 mg via INTRAVENOUS
  Administered 2016-11-15: 1 mg via INTRAVENOUS
  Administered 2016-11-15: 4.4 mg via INTRAVENOUS
  Administered 2016-11-15: 5.2 mg via INTRAVENOUS
  Administered 2016-11-15: 4 mg via INTRAVENOUS
  Administered 2016-11-16: 8.8 mg via INTRAVENOUS
  Administered 2016-11-16: 3.2 mg via INTRAVENOUS
  Administered 2016-11-16: 22:00:00 via INTRAVENOUS
  Administered 2016-11-16: 4 mg via INTRAVENOUS
  Administered 2016-11-16: 2.8 mg via INTRAVENOUS
  Administered 2016-11-16 – 2016-11-17 (×2): 3.2 mg via INTRAVENOUS
  Administered 2016-11-17: 2.8 mg via INTRAVENOUS
  Filled 2016-11-14 (×3): qty 25

## 2016-11-14 MED ORDER — ARIPIPRAZOLE 10 MG PO TABS
10.0000 mg | ORAL_TABLET | Freq: Every day | ORAL | Status: DC
  Administered 2016-11-14 – 2016-11-21 (×8): 10 mg via ORAL
  Filled 2016-11-14 (×8): qty 1

## 2016-11-14 MED ORDER — DIPHENHYDRAMINE HCL 50 MG/ML IJ SOLN
25.0000 mg | INTRAMUSCULAR | Status: DC | PRN
  Administered 2016-11-14 – 2016-11-16 (×9): 25 mg via INTRAVENOUS
  Filled 2016-11-14 (×9): qty 1

## 2016-11-14 NOTE — ED Triage Notes (Signed)
Patent brought in by [facility] from [facility] to be evaluated for sickle cell crisis. She normally receives treatment at sickle cell clinic patient states she is in process of moving to [facility].

## 2016-11-14 NOTE — H&P (Signed)
[facility] H&P   Patient Demographics:    Brittany Foster, is a 31 y.o. female  MRN: [id number]   DOB - May 22, 1985  Admit Date - 11/14/2016  Outpatient Primary MD for the patient is Bing Neighbors, FNP  Referring MD/NP/PA:  Fredderick Phenix  Outpatient Specialists:    [facility]  =>Kimberly Tiburcio Pea  Patient coming from: [facility] , [place], [place]  No chief complaint on file.  Sickle cell crisis    HPI:    Brittany Foster  is a 31 y.o. female, w sickle cell anemia presented to [facility] in [place], and was transferred to [facility] for sickle cell crisis.   "  Pt was having pain in the left lower rib and her bilateral hips c/w her prior sickle cell crisis.  Pt denies fever, chills, cough, sob,  N/v, abd pain, diarrhea, brbpr.  Pt was tx with iv dialudid at [facility].  Pt can't recall how much she received.   In ED, at [facility],  Pt in obvious pain.  Pt is essentially opioid naive, she takes percocet 10/325mg  only prn.  The last few days she has taken them more regularly.  4 per day.  Pt still has left over medication from her recent hospital stay at [facility] on 8/26.  Pt will be admitted for sickle cell crisis.      Review of systems:    In addition to the HPI above, No Fever-chills, No Headache, No changes with Vision or hearing, No problems swallowing food or Liquids, No Cough or Shortness of Breath, No Abdominal pain, No Nausea or Vommitting, Bowel movements are regular, No Blood in stool or Urine, No dysuria, No new skin rashes or bruises,  No new weakness, tingling, numbness in any extremity, No recent weight gain or loss, No polyuria, polydypsia or polyphagia, No significant Mental Stressors.  A full 10 point Review of Systems was done, except as stated above, all other Review of Systems were negative.   With Past History of the following :     Past Medical History:  Diagnosis Date  . Anemia   . Depression, major, recurrent (HCC)   . Migraines   . Sickle cell anemia (HCC)       Past Surgical History:  Procedure Laterality Date  . CESAREAN SECTION    . CHOLECYSTECTOMY  2000  . IR GENERIC HISTORICAL  10/08/2015   IR US GUIDE VASC ACCESS RIGHT 10/08/2015 Simonne Come, MD [facility]-INTER[facility]  . IR GENERIC HISTORICAL  10/08/2015   IR FLUORO GUIDE CV LINE RIGHT 10/08/2015 Simonne Come, MD [facility]-INTER[facility]  . MULTIPLE TOOTH EXTRACTIONS N/A   . port a cath placement Right    about 6-7 years ago  . removal of porta cath Right 09/11/15  . TUBAL LIGATION        Social History:     Social  History  Substance Use Topics  . Smoking status: Current Some Day Smoker    Packs/day: 1.00  . Smokeless tobacco: Current User  . Alcohol use No     Lives - at home  Mobility - walks by self   Family History :     Family History  Problem Relation Age of Onset  . Sickle cell trait Father   . Sickle cell trait Mother   . Sickle cell anemia Other       Home Medications:   Prior to Admission medications   Medication Sig Start Date End Date Taking? Authorizing Provider  ARIPiprazole (ABILIFY) 10 MG tablet Take 10 mg by mouth daily 02/13/16   [provider]  DULoxetine (CYMBALTA) 60 MG capsule TAKE 1 CAPSULE BY MOUTH DAILY. 11/27/15   Quentin Angst, MD  folic acid (FOLVITE) 1 MG tablet Take 1 tablet (1 mg total) by mouth daily. 09/11/16   Bing Neighbors, FNP  hydroxyurea (HYDREA) 500 MG capsule TAKE 2 CAPSULES BY MOUTH DAILY. MAY TAKE WITH FOOD TO MINIMIZE GI SIDE EFFECTS. Patient taking differently: Take 1,500 mg by mouth daily.  05/12/16   Quentin Angst, MD  ibuprofen (ADVIL,MOTRIN) 200 MG tablet Take 400 mg by mouth every 6 (six) hours as needed.    [provider]  medroxyPROGESTERone (DEPO-PROVERA) 150 MG/ML injection Inject 150 mg into the muscle every 3 (three) months.     [provider]   morphine (MS CONTIN) 30 MG 12 hr tablet Take 1 tablet (30 mg total) by mouth every 12 (twelve) hours. 10/30/16   Quentin Angst, MD  oxyCODONE-acetaminophen (PERCOCET) 10-325 MG tablet Take 1 tablet by mouth every 6 (six) hours as needed for pain. for pain 10/30/16   Quentin Angst, MD  Topiramate ER 100 MG CP24 Take 200 mg by mouth at bedtime. Patient taking differently: Take 100 mg by mouth at bedtime.  09/11/16   Bing Neighbors, FNP    Pt states not taking MS Contin   Allergies:     Allergies  Allergen Reactions  . Ultram [Tramadol] Other (See Comments)    Reaction:  Seizures   . Zofran [Ondansetron Hcl] Nausea And Vomiting  . Buprenorphine Hcl Hives and Other (See Comments)    Reaction:  Shaking   . Fentanyl Hives  . Morphine And Related Hives and Other (See Comments)    Reaction:  Shaking   . Tape Rash     Physical Exam:   Vitals  Blood pressure 126/81, pulse 100, temperature 99 F (37.2 C), temperature source Oral, resp. rate 18, height 5' (1.524 m), weight 57.6 kg (127 lb), SpO2 94 %.   1. General  lying in bed in NAD  2. Normal affect and insight, Not Suicidal or Homicidal, Awake Alert, Oriented X 3.  3. No F.N deficits, ALL C.Nerves Intact, Strength 5/5 all 4 extremities, Sensation intact all 4 extremities, Plantars down going.  4. Ears and Eyes appear Normal, Conjunctivae clear, PERRLA. Moist Oral Mucosa.  5. Supple Neck, No JVD, No cervical lymphadenopathy appriciated, No Carotid Bruits.  6. Symmetrical Chest wall movement, Good air movement bilaterally, CTAB.  7. RRR, No Gallops, Rubs or Murmurs, No Parasternal Heave.  8. Positive Bowel Sounds, Abdomen Soft, No tenderness, No organomegaly appriciated,No rebound -guarding or rigidity.  9.  No Cyanosis, Normal Skin Turgor, No Skin Rash or Bruise.  10. Good muscle tone,  joints appear normal , no effusions, Normal ROM.  11. No Palpable Lymph  Nodes in Neck or Axillae    Data Review:     CBC No results for input(s): WBC, HGB, HCT, PLT, MCV, MCH, MCHC, RDW, LYMPHSABS, MONOABS, EOSABS, BASOSABS, BANDABS in the last 168 hours.  Invalid input(s): NEUTRABS, BANDSABD ------------------------------------------------------------------------------------------------------------------  Chemistries  No results for input(s): NA, K, CL, CO2, GLUCOSE, BUN, CREATININE, CALCIUM, MG, AST, ALT, ALKPHOS, BILITOT in the last 168 hours.  Invalid input(s): GFRCGP ------------------------------------------------------------------------------------------------------------------ estimated creatinine clearance is 80.2 mL/min (by C-G formula based on SCr of 0.61 mg/dL). ------------------------------------------------------------------------------------------------------------------ No results for input(s): TSH, T4TOTAL, T3FREE, THYROIDAB in the last 72 hours.  Invalid input(s): FREET3  Coagulation profile No results for input(s): INR, PROTIME in the last 168 hours. ------------------------------------------------------------------------------------------------------------------- No results for input(s): DDIMER in the last 72 hours. -------------------------------------------------------------------------------------------------------------------  Cardiac Enzymes No results for input(s): CKMB, TROPONINI, MYOGLOBIN in the last 168 hours.  Invalid input(s): CK ------------------------------------------------------------------------------------------------------------------    Component Value Date/Time   BNP 114.5 (H) 04/29/2014 1459     ---------------------------------------------------------------------------------------------------------------  Urinalysis    Component Value Date/Time   COLORURINE YELLOW 10/08/2016 1047   APPEARANCEUR CLEAR 10/08/2016 1047   LABSPEC 1.015 10/08/2016 1047   PHURINE 5.0 10/08/2016 1047   GLUCOSEU NEGATIVE 10/08/2016 1047   HGBUR MODERATE (A) 10/08/2016  1047   BILIRUBINUR NEGATIVE 10/08/2016 1047   KETONESUR NEGATIVE 10/08/2016 1047   PROTEINUR NEGATIVE 10/08/2016 1047   UROBILINOGEN 2.0 (H) 09/11/2016 1348   NITRITE NEGATIVE 10/08/2016 1047   LEUKOCYTESUR LARGE (A) 10/08/2016 1047    ----------------------------------------------------------------------------------------------------------------   Imaging Results:    No results found.   Assessment & Plan:    Principal Problem:   Sickle cell crisis (HCC) Active Problems:   Anemia    HB SS with crisis:  Pt is essentially opiate naive as she has not had MME of /day over the past few weeks.  We will start PCA at lower dose of 0.4mg  bolus with lockout of 10 minutes and 1 hour total of 2.4mg , Toradol and IVF Benadryl  iv q4h prn itch Check cbc, retic, cmp  CP Tele 12 lead ekg Trop i CXR  (per ED negative at [facility])  Anemia of chronic disease Check cbc Continue hydroxyurea  po qday  Nicotine dep Pt declines nicotine patch.    DVT Prophylaxis Lovenox - SCDs  AM Labs Ordered, also please review Full Orders  Family Communication: Admission, patients condition and plan of care including tests being ordered have been discussed with the patient  who indicate understanding and agree with the plan and Code Status.  Code Status FULL CODE  Likely DC to  home  Condition GUARDED    Consults called: none  Admission status: inpatient   Time spent in minutes : 45   Pearson Grippe M.D on 11/14/2016 at 8:16 PM  Between 7am to 7pm - Pager - [phone]. After 7pm go to [email or web address] - password [facility]  [facility] - Office  [phone]

## 2016-11-14 NOTE — ED Provider Notes (Signed)
[facility]-EMERGENCY DEPT Provider Note   CSN: [id number] Arrival date & time: 11/14/16  7253     History   Chief Complaint No chief complaint on file.   HPI Brittany Foster is a 31 y.o. female.  Patient is a 31 year old female who presents with sickle cell pain crises. She has a history of sickle cell and has a one-day history of pain in her rib cage and legs. This is typical for her sickle cell pain crises. She denies any fevers cough congestion or significant shortness of breath. She's had multiple CT angios in the past that it been negative for pulmonary embolus. She was initially seen today at [facility] and treated with a total of 3 mg of Dilaudid. She felt that she needed to be admitted in the hospitalist at that facility was not comfortable with the admission. She opted to be transferred to [facility] long.The ED provider there did discuss the case with our ED provider and Dr. Mikeal Hawthorne who felt that patient should be assessed in the ED by the sickle cell physician prior to admission.      Past Medical History:  Diagnosis Date  . Anemia   . Depression, major, recurrent (HCC)   . Migraines   . Sickle cell anemia [facility])     Patient Active Problem List   Diagnosis Date Noted  . Hb-SS disease with crisis (HCC) 10/25/2016  . Urine leukocytes 10/08/2016  . Sickle cell anemia with crisis (HCC) 09/08/2016  . Nausea & vomiting 08/06/2016  . Sickle cell crisis (HCC) 05/24/2016  . Sickle cell anemia with pain (HCC) 02/14/2016  . Hypotension 01/26/2016  . Opiate dependence (HCC) 01/23/2016  . MDD (major depressive disorder), recurrent severe, without psychosis (HCC) 01/10/2016  . Vitamin D deficiency 01/08/2015  . Chronic pain syndrome 01/08/2015  . Hypokalemia 08/21/2014  . 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 Anemia 02/09/2014  . Neuropathy (HCC) 02/09/2014  . Sickle cell pain crisis (HCC) 12/30/2013    Past Surgical History:  Procedure Laterality  Date  . CESAREAN SECTION    . CHOLECYSTECTOMY  2000  . IR GENERIC HISTORICAL  10/08/2015   IR US GUIDE VASC ACCESS RIGHT 10/08/2015 Simonne Come, MD [facility]-INTERV RAD  . IR GENERIC HISTORICAL  10/08/2015   IR FLUORO GUIDE CV LINE RIGHT 10/08/2015 Simonne Come, MD [facility]INTERV RAD  . MULTIPLE TOOTH EXTRACTIONS N/A   . port a cath placement Right    about 6-7 years ago  . removal of porta cath Right 09/11/15  . TUBAL LIGATION      OB History    Gravida Para Term Preterm AB Living   1         1   SAB TAB Ectopic Multiple Live Births           1       Home Medications    Prior to Admission medications   Medication Sig Start Date End Date Taking? Authorizing Provider  ARIPiprazole (ABILIFY) 10 MG tablet Take 10 mg by mouth daily 02/13/16   [provider]  DULoxetine (CYMBALTA) 60 MG capsule TAKE 1 CAPSULE BY MOUTH DAILY. 11/27/15   Quentin Angst, MD  folic acid (FOLVITE) 1 MG tablet Take 1 tablet (1 mg total) by mouth daily. 09/11/16   Bing Neighbors, FNP  hydroxyurea (HYDREA) 500 MG capsule TAKE 2 CAPSULES BY MOUTH DAILY. MAY TAKE WITH FOOD TO MINIMIZE GI SIDE EFFECTS. Patient taking differently: Take 1,500 mg by mouth daily.  05/12/16   Quentin Angst, MD  ibuprofen (ADVIL,MOTRIN) 200 MG tablet Take 400 mg by mouth every 6 (six) hours as needed.    [provider]  medroxyPROGESTERone (DEPO-PROVERA) 150 MG/ML injection Inject 150 mg into the muscle every 3 (three) months.     [provider]  morphine (MS CONTIN) 30 MG 12 hr tablet Take 1 tablet (30 mg total) by mouth every 12 (twelve) hours. 10/30/16   Quentin Angst, MD  oxyCODONE-acetaminophen (PERCOCET) 10-325 MG tablet Take 1 tablet by mouth every 6 (six) hours as needed for pain. for pain 10/30/16   Quentin Angst, MD  Topiramate ER 100 MG CP24 Take 200 mg by mouth at bedtime. Patient taking differently: Take 100 mg by mouth at bedtime.  09/11/16   Bing Neighbors, FNP    Family  History Family History  Problem Relation Age of Onset  . Sickle cell trait Father   . Sickle cell trait Mother   . Sickle cell anemia Other     Social History Social History  Substance Use Topics  . Smoking status: Current Some Day Smoker    Packs/day: 1.00  . Smokeless tobacco: Current User  . Alcohol use No     Allergies   Ultram [tramadol]; Zofran [ondansetron hcl]; Buprenorphine hcl; Fentanyl; Morphine and related; and Tape   Review of Systems Review of Systems  Constitutional: Negative for chills, diaphoresis, fatigue and fever.  HENT: Negative for congestion, rhinorrhea and sneezing.   Eyes: Negative.   Respiratory: Negative for cough, chest tightness and shortness of breath.   Cardiovascular: Positive for chest pain. Negative for leg swelling.  Gastrointestinal: Negative for abdominal pain, blood in stool, diarrhea, nausea and vomiting.  Genitourinary: Negative for difficulty urinating, flank pain, frequency and hematuria.  Musculoskeletal: Positive for arthralgias. Negative for back pain.  Skin: Negative for rash.  Neurological: Negative for dizziness, speech difficulty, weakness, numbness and headaches.     Physical Exam Updated Vital Signs BP 126/81 (BP Location: Left Arm)   Pulse 100   Temp 99 F (37.2 C) (Oral)   Resp 18   Ht 5' (1.524 m)   Wt 57.6 kg (127 lb)   SpO2 94%   BMI 24.80 kg/m   Physical Exam  Constitutional: She is oriented to person, place, and time. She appears well-developed and well-nourished.  HENT:  Head: Normocephalic and atraumatic.  Eyes: Pupils are equal, round, and reactive to light.  Neck: Normal range of motion. Neck supple.  Cardiovascular: Normal rate, regular rhythm and normal heart sounds.   Pulmonary/Chest: Effort normal and breath sounds normal. No respiratory distress. She has no wheezes. She has no rales. She exhibits no tenderness.  Abdominal: Soft. Bowel sounds are normal. There is no tenderness. There is no  rebound and no guarding.  Musculoskeletal: Normal range of motion. She exhibits no edema.  No joint swelling or erythema  Lymphadenopathy:    She has no cervical adenopathy.  Neurological: She is alert and oriented to person, place, and time.  Skin: Skin is warm and dry. No rash noted.  Psychiatric: She has a normal mood and affect.     ED Treatments / Results  Labs (all labs ordered are listed, but only abnormal results are displayed) Labs Reviewed - No data to display  EKG  EKG Interpretation None       Radiology No results found.  Procedures Procedures (including critical care time)  Medications Ordered in ED Medications  HYDROmorphone (DILAUDID) injection 1  mg (not administered)     Initial Impression / Assessment and Plan / ED Course  I have reviewed the triage vital signs and the nursing notes.  Pertinent labs & imaging results that were available during my care of the patient were reviewed by me and considered in my medical decision making (see chart for details).     Patient is transferred from an outside facility for admission for pain control of her sickle cell crises. She has no hypoxia or other signs of acute chest syndrome. She had a chest x-ray done at the other facility which was negative for acute abnormalities. She had a CBC which showed a normal white count at 9.8. Her hemoglobin is 7.6 which appears to be similar to her baseline values. Her reticulocyte count was 4%. Her electrolytes were non-concerning and she had a normal creatinine. I also reviewed an EKG from the outlying facility which showed a sinus rhythm at a rate of 93 without ischemic changes.  I spoke with Dr. Selena Batten who will admit the pt  Final Clinical Impressions(s) / ED Diagnoses   Final diagnoses:  Sickle cell pain crisis [facility])    New Prescriptions New Prescriptions   No medications on file     Rolan Bucco, MD 11/14/16 2243

## 2016-11-14 NOTE — ED Notes (Signed)
Hospitalist at bedside 

## 2016-11-14 NOTE — ED Notes (Signed)
Patient transported to X-ray 

## 2016-11-14 NOTE — ED Notes (Signed)
Bed: ZO10 Expected date:  Expected time:  Means of arrival:  Comments: SSC

## 2016-11-15 DIAGNOSIS — D5700 Hb-SS disease with crisis, unspecified: Principal | ICD-10-CM

## 2016-11-15 LAB — COMPREHENSIVE METABOLIC PANEL
ALBUMIN: 3.7 g/dL (ref 3.5–5.0)
ALT: 21 U/L (ref 14–54)
AST: 34 U/L (ref 15–41)
Alkaline Phosphatase: 93 U/L (ref 38–126)
Anion gap: 8 (ref 5–15)
BILIRUBIN TOTAL: 3.2 mg/dL — AB (ref 0.3–1.2)
BUN: 7 mg/dL (ref 6–20)
CO2: 21 mmol/L — ABNORMAL LOW (ref 22–32)
CREATININE: 0.44 mg/dL (ref 0.44–1.00)
Calcium: 8.7 mg/dL — ABNORMAL LOW (ref 8.9–10.3)
Chloride: 110 mmol/L (ref 101–111)
GFR calc Af Amer: >60 mL/min (ref >60)
GLUCOSE: 128 mg/dL — AB (ref 65–99)
Potassium: 2.8 mmol/L — ABNORMAL LOW (ref 3.5–5.1)
Sodium: 139 mmol/L (ref 135–145)
TOTAL PROTEIN: 6.8 g/dL (ref 6.5–8.1)

## 2016-11-15 LAB — DIFFERENTIAL
BASOS ABS: 0.1 10*3/uL (ref 0.0–0.1)
Basophils Relative: 1 %
EOS ABS: 0.1 10*3/uL (ref 0.0–0.7)
Eosinophils Relative: 1 %
LYMPHS ABS: 4.3 10*3/uL — AB (ref 0.7–4.0)
LYMPHS PCT: 48 %
Monocytes Absolute: 0.6 10*3/uL (ref 0.1–1.0)
Monocytes Relative: 7 %
NEUTROS ABS: 3.8 10*3/uL (ref 1.7–7.7)
NEUTROS PCT: 43 %

## 2016-11-15 LAB — RETICULOCYTES
RBC.: 2.03 MIL/uL — ABNORMAL LOW (ref 3.87–5.11)
RETIC CT PCT: 9.8 % — AB (ref 0.4–3.1)
Retic Count, Absolute: 198.9 10*3/uL — ABNORMAL HIGH (ref 19.0–186.0)

## 2016-11-15 LAB — CBC
HEMATOCRIT: 21.5 % — AB (ref 36.0–46.0)
Hemoglobin: 7.7 g/dL — ABNORMAL LOW (ref 12.0–15.0)
MCH: 37.9 pg — ABNORMAL HIGH (ref 26.0–34.0)
MCHC: 35.8 g/dL (ref 30.0–36.0)
MCV: 105.9 fL — AB (ref 78.0–100.0)
Platelets: 172 10*3/uL (ref 150–400)
RBC: 2.03 MIL/uL — AB (ref 3.87–5.11)
RDW: 21.7 % — ABNORMAL HIGH (ref 11.5–15.5)
WBC: 8.9 10*3/uL (ref 4.0–10.5)

## 2016-11-15 LAB — TROPONIN I: Troponin I: <0.03 ng/mL (ref <0.03)

## 2016-11-15 NOTE — Progress Notes (Signed)
Subjective: Patient is a 31 year old female admitted last night with sickle cell painful crisis.  Patient was transferred from [facility] in [place]. [place].  Patient has chronic pain syndrome.  Her vitals are stable high hemoglobin is at baseline. Her problem is she has no  Primary care physician at the moment and no hematologist.  She is not able to getr pain medications in the outpatient setting and has been going to different emergency rooms. She is complaining of 10 out of 10 pain at the moment.  She has been started on IV fluids.  Objective: Vital signs in last 24 hours: Temp:  [98 F (36.7 C)-99 F (37.2 C)] 98.1 F (36.7 C) (09/16 0820) Pulse Rate:  [79-106] 79 (09/16 0820) Resp:  [16-18] 16 (09/16 0820) BP: (89-126)/(51-81) 89/51 (09/16 0820) SpO2:  [93 %-100 %] 93 % (09/16 0820) Weight:  [57.6 kg (127 lb)] 57.6 kg (127 lb) (09/15 1856) Weight change:  Last BM Date: 11/14/16  Intake/Output from previous day: 09/15 0701 - 09/16 0700 In: 1272.3 [P.O.:240; I.V.:1032.3] Out: -  Intake/Output this shift: No intake/output data recorded.  General appearance: alert, cooperative, appears stated age and no distress Chest wall: no tenderness Cardio: regular rate and rhythm, S1, S2 normal, no murmur, click, rub or gallop GI: soft, non-tender; bowel sounds normal; no masses,  no organomegaly Extremities: extremities normal, atraumatic, no cyanosis or edema Pulses: 2+ and symmetric Skin: Skin color, texture, turgor normal. No rashes or lesions Neurologic: Grossly normal  Lab Results:  Recent Labs  11/15/16 0100  WBC 8.9  HGB 7.7*  HCT 21.5*  PLT 172   BMET  Recent Labs  11/15/16 0100  NA 139  K 2.8*  CL 110  CO2 21*  GLUCOSE 128*  BUN 7  CREATININE 0.44  CALCIUM 8.7*    Studies/Results: Dg Chest 2 View  Result Date: 11/14/2016 CLINICAL DATA:  Left-sided rib and chest pain. History of sickle cell. EXAM: CHEST  2 VIEW COMPARISON:  08/22/2016 FINDINGS: The heart size and  mediastinal contours are within normal limits. Right-sided catheter tip is seen in the mid SVC as before. Both lungs are clear. The visualized skeletal structures are unremarkable. IMPRESSION: No active cardiopulmonary disease. Electronically Signed   By: Tollie Eth M.D.   On: 11/14/2016 20:47    Medications: I have reviewed the patient's current medications.  Assessment/Plan: A 31 year old female admitted with sickle cell painful crisis. #1 sickle cell painful crisis: Patient will be maintained on the Dilaudid PCA and Toradol.  We will need to address her outpatient pain medications  Prior to discharge.  She is not  Opiate nave however I will keep her on current low dose PCA since her probably is more of chronic pain.  #2 hypokalemia: Continue to repeat potassium.  Poor oral intake may be responsible.  #3 sickle cell anemia: Patient is actually at her baseline  Hemoglobin.  No need for transfusion.  #4 chronic pain syndrome: Patient needs outpatient chronic pain management..  She is at the moment not complying with outpatient care.   LOS: 1 day   GARBA,LAWAL 11/15/2016, 8:24 AM

## 2016-11-15 NOTE — Progress Notes (Signed)
Hydrea not given because pharmacy has to verify with two pharmacists. Advised by pharmacist to keep on hold until am. Will c/t monitor.

## 2016-11-16 DIAGNOSIS — F339 Major depressive disorder, recurrent, unspecified: Secondary | ICD-10-CM

## 2016-11-16 DIAGNOSIS — D638 Anemia in other chronic diseases classified elsewhere: Secondary | ICD-10-CM

## 2016-11-16 LAB — CBC WITH DIFFERENTIAL/PLATELET
BASOS ABS: 0.1 10*3/uL (ref 0.0–0.1)
Basophils Relative: 1 %
EOS ABS: 0.3 10*3/uL (ref 0.0–0.7)
Eosinophils Relative: 3 %
HCT: 18.8 % — ABNORMAL LOW (ref 36.0–46.0)
Hemoglobin: 6.9 g/dL — CL (ref 12.0–15.0)
LYMPHS ABS: 3.7 10*3/uL (ref 0.7–4.0)
Lymphocytes Relative: 44 %
MCH: 39.4 pg — ABNORMAL HIGH (ref 26.0–34.0)
MCHC: 36.7 g/dL — AB (ref 30.0–36.0)
MCV: 107.4 fL — ABNORMAL HIGH (ref 78.0–100.0)
MONO ABS: 0.4 10*3/uL (ref 0.1–1.0)
MONOS PCT: 5 %
Neutro Abs: 3.9 10*3/uL (ref 1.7–7.7)
Neutrophils Relative %: 47 %
PLATELETS: 149 10*3/uL — AB (ref 150–400)
RBC: 1.75 MIL/uL — AB (ref 3.87–5.11)
RDW: 23.8 % — AB (ref 11.5–15.5)
WBC: 8.4 10*3/uL (ref 4.0–10.5)

## 2016-11-16 LAB — COMPREHENSIVE METABOLIC PANEL
ALK PHOS: 92 U/L (ref 38–126)
ALT: 22 U/L (ref 14–54)
AST: 34 U/L (ref 15–41)
Albumin: 3.4 g/dL — ABNORMAL LOW (ref 3.5–5.0)
Anion gap: 7 (ref 5–15)
BILIRUBIN TOTAL: 2.3 mg/dL — AB (ref 0.3–1.2)
BUN: 6 mg/dL (ref 6–20)
CALCIUM: 8.5 mg/dL — AB (ref 8.9–10.3)
CO2: 23 mmol/L (ref 22–32)
Chloride: 111 mmol/L (ref 101–111)
Creatinine, Ser: 0.52 mg/dL (ref 0.44–1.00)
GFR calc Af Amer: >60 mL/min (ref >60)
Glucose, Bld: 123 mg/dL — ABNORMAL HIGH (ref 65–99)
POTASSIUM: 3 mmol/L — AB (ref 3.5–5.1)
Sodium: 141 mmol/L (ref 135–145)
TOTAL PROTEIN: 6.4 g/dL — AB (ref 6.5–8.1)

## 2016-11-16 LAB — RETICULOCYTES
RBC.: 1.74 MIL/uL — AB (ref 3.87–5.11)
RETIC COUNT ABSOLUTE: 224.5 10*3/uL — AB (ref 19.0–186.0)
RETIC CT PCT: 12.9 % — AB (ref 0.4–3.1)

## 2016-11-16 MED ORDER — KETOROLAC TROMETHAMINE 15 MG/ML IJ SOLN
30.0000 mg | Freq: Four times a day (QID) | INTRAMUSCULAR | Status: AC
  Administered 2016-11-19: 30 mg via INTRAVENOUS
  Filled 2016-11-16 (×7): qty 2

## 2016-11-16 MED ORDER — DIPHENHYDRAMINE HCL 25 MG PO CAPS
25.0000 mg | ORAL_CAPSULE | ORAL | Status: DC | PRN
  Administered 2016-11-19: 25 mg via ORAL
  Filled 2016-11-16 (×3): qty 1

## 2016-11-16 NOTE — Care Management Note (Signed)
Case Management Note  Patient Details  Name: Brittany Foster MRN: [id number] Date of Birth: [birth year]  Subjective/Objective:          ssc          Action/Plan: Date:  November 16, 2016 Chart reviewed for concurrent status and case management needs.  Will continue to follow patient progress.  Discharge Planning: following for needs  Expected discharge date: [id number]  Marcelle Smiling, BSN, [place], [place]   [phone]   Expected Discharge Date:                  Expected Discharge Plan:  Home/Self Care  In-House Referral:     Discharge planning Services  CM Consult  Post Acute Care Choice:    Choice offered to:     DME Arranged:    DME Agency:     HH Arranged:    HH Agency:     Status of Service:  In process, will continue to follow  If discussed at Long Length of Stay Meetings, dates discussed:    Additional Comments:  Golda Acre, RN 11/16/2016, 9:13 AM

## 2016-11-16 NOTE — Progress Notes (Signed)
PHARMACIST - PHYSICIAN COMMUNICATION  Key Points: Use following P&T approved IV to PO diphenhydramine (Benadryl) policy. Description contains the criteria that are approved  DR:   Ashley Royalty CONCERNING: IV to Oral Route Change Policy  RECOMMENDATION: This patient is receiving diphenhydramine by the intravenous route.  Based on criteria approved by the Pharmacy and Therapeutics Committee, intravenous diphenhydramine is being converted to the equivalent oral dose form(s).  DESCRIPTION: These criteria include:  Diphenhydramine is not prescribed to treat or prevent a severe allergic reaction  Diphenhydramine is not prescribed as premedication prior to receiving blood product, biologic medication, antimicrobial, or chemotherapy agent  The patient has tolerated at least one dose of an oral or enteral medication  The patient has no evidence of active gastrointestinal bleeding or impaired GI absorption (gastrectomy, short bowel, patient on TNA or NPO).  The patient is not undergoing procedural sedation   If you have questions about this conversion, please contact the Pharmacy Department    [phone])  [facility] PharmD, [place] Pager [phone] 11/16/2016 10:28 AM

## 2016-11-16 NOTE — Progress Notes (Signed)
SICKLE CELL SERVICE PROGRESS NOTE  Brittany Foster WUJ:[id number] DOB: August 05, 1985 DOA: 11/14/2016 PCP: Bing Neighbors, FNP  Assessment/Plan: Principal Problem:   Sickle cell crisis (HCC) Active Problems:   Anemia  1. Hb SS with Crisis: Continue PCA at current dose and increase Toradol to 30 mg . Decrease IVF to 75 ml/hr 2. Anemia of Chronic Disease: Hb at baseline with an adequate reticulocyte response. Continue Hydrea. Increase Folate to 2 mg.  3. Depression: Continue Abilify and Cymbalta. 4. Chronic Pain Syndrome: Pt was on MS Contin but had it last prescribed on 08/14/2016. She is currently not on it chronically as she has no provider to prescribe. Will not resume.  5.  Code Status: Full Code Family Communication: N/A Disposition Plan: Not yet ready for discharge  [name],[name]  Pager [phone]. If 7PM-7AM, please contact night-coverage.  11/16/2016, 10:45 AM  LOS: 2 days   Interim History: Pt reports pain as 9/10 and localized to left sided ribs and b/l hips and legs. She has used 21.6 mg of Dilaudid with 57/54:demands/deliveries in the last 24 hours.   Consultants:  None  Procedures:  None  Antibiotics:  None    Objective: Vitals:   11/16/16 0011 11/16/16 0354 11/16/16 0452 11/16/16 0800  BP: (!) 90/51  (!) 99/51   Pulse: 91  88   Resp: Temp: 98.1 F (36.7 C)  98.2 F (36.8 C)   TempSrc: Oral  Oral   SpO2: 93% 94% 94% 98%  Weight:      Height:       Weight change:   Intake/Output Summary (Last 24 hours) at 11/16/16 1045 Last data filed at 11/16/16 0657  Gross per 24 hour  Intake          3109.33 ml  Output                0 ml  Net          3109.33 ml     Physical Exam General: Alert, awake, oriented x3, in no acute distress.  HEENT: [place]/AT PEERL, EOMI, anicteric Neck: Trachea midline,  no masses, no thyromegal,y no JVD, no carotid bruit OROPHARYNX:  Moist, No exudate/ erythema/lesions.  Heart: Regular rate and rhythm, II/VI  non-radiating systolic ejection murmur at base. No rubs, gallops, PMI non-displaced, no heaves or thrills on palpation.  Lungs: Clear to auscultation, no wheezing or rhonchi noted. No increased vocal fremitus resonant to percussion  Abdomen: Soft, nontender, nondistended, positive bowel sounds, no masses no hepatosplenomegaly noted.  Neuro: No focal neurological deficits noted cranial nerves II through XII grossly intact. Strength at functional baseline in bilateral upper and lower extremities. Musculoskeletal: No warmth swelling or erythema around joints, no spinal tenderness noted. Psychiatric: Patient alert and oriented x3, good insight and cognition, good recent to remote recall.     Data Reviewed: Basic Metabolic Panel:  Recent Labs Lab 11/15/16 0100 11/16/16 0626  NA 139 141  K 2.8* 3.0*  CL 110 111  CO2 21* 23  GLUCOSE 128* 123*  BUN 7 6  CREATININE 0.44 0.52  CALCIUM 8.7* 8.5*   Liver Function Tests:  Recent Labs Lab 11/15/16 0100 11/16/16 0626  AST 34 34  ALT 21 22  ALKPHOS 93 92  BILITOT 3.2* 2.3*  PROT 6.8 6.4*  ALBUMIN 3.7 3.4*   No results for input(s): LIPASE, AMYLASE in the last 168 hours. No results for input(s): AMMONIA in the last 168 hours. CBC:  Recent Labs Lab  11/15/16 0100 11/16/16 0626  WBC 8.9 8.4  NEUTROABS 3.8 3.9  HGB 7.7* 6.9*  HCT 21.5* 18.8*  MCV 105.9* 107.4*  PLT 172 149*   Cardiac Enzymes:  Recent Labs Lab 11/15/16 0100  TROPONINI <0.03   BNP (last 3 results) No results for input(s): BNP in the last 8760 hours.  ProBNP (last 3 results) No results for input(s): PROBNP in the last 8760 hours.  CBG: No results for input(s): GLUCAP in the last 168 hours.  No results found for this or any previous visit (from the past 240 hour(s)).   Studies: Dg Chest 2 View  Result Date: 11/14/2016 CLINICAL DATA:  Left-sided rib and chest pain. History of sickle cell. EXAM: CHEST  2 VIEW COMPARISON:  08/22/2016 FINDINGS: The heart  size and mediastinal contours are within normal limits. Right-sided catheter tip is seen in the mid SVC as before. Both lungs are clear. The visualized skeletal structures are unremarkable. IMPRESSION: No active cardiopulmonary disease. Electronically Signed   By: Tollie Eth M.D.   On: 11/14/2016 20:47    Scheduled Meds: . ARIPiprazole  10 mg Oral Daily  . DULoxetine  60 mg Oral Daily  . enoxaparin (LOVENOX) injection  40 mg Subcutaneous QHS  . folic acid  1 mg Oral Daily  . HYDROmorphone   Intravenous Q4H  . hydroxyurea  1,500 mg Oral Daily  . ketorolac  15 mg Intravenous Q6H  . senna-docusate  1 tablet Oral BID  . topiramate  50 mg Oral BID   Continuous Infusions: . dextrose 5 % and 0.45% NaCl 125 mL/hr at 11/16/16 0759    Principal Problem:   Sickle cell crisis (HCC) Active Problems:   Anemia     In excess of 25 minutes spent during this visit. Greater than 50% involved face to face contact with the patient for assessment, counseling and coordination of care.

## 2016-11-17 DIAGNOSIS — R5383 Other fatigue: Secondary | ICD-10-CM

## 2016-11-17 DIAGNOSIS — R5381 Other malaise: Secondary | ICD-10-CM

## 2016-11-17 DIAGNOSIS — G894 Chronic pain syndrome: Secondary | ICD-10-CM

## 2016-11-17 LAB — CBC WITH DIFFERENTIAL/PLATELET
Basophils Absolute: 0 10*3/uL (ref 0.0–0.1)
Basophils Relative: 0 %
EOS ABS: 0.3 10*3/uL (ref 0.0–0.7)
Eosinophils Relative: 4 %
HCT: 18.3 % — ABNORMAL LOW (ref 36.0–46.0)
HEMOGLOBIN: 6.5 g/dL — AB (ref 12.0–15.0)
Lymphocytes Relative: 39 %
Lymphs Abs: 3.1 10*3/uL (ref 0.7–4.0)
MCH: 38.9 pg — AB (ref 26.0–34.0)
MCHC: 35.5 g/dL (ref 30.0–36.0)
MCV: 109.6 fL — ABNORMAL HIGH (ref 78.0–100.0)
MONO ABS: 0.5 10*3/uL (ref 0.1–1.0)
Monocytes Relative: 6 %
NEUTROS ABS: 4.1 10*3/uL (ref 1.7–7.7)
NRBC: 6 /100{WBCs} — AB
Neutrophils Relative %: 51 %
PLATELETS: 134 10*3/uL — AB (ref 150–400)
RBC: 1.67 MIL/uL — ABNORMAL LOW (ref 3.87–5.11)
RDW: 24.5 % — ABNORMAL HIGH (ref 11.5–15.5)
WBC: 8 10*3/uL (ref 4.0–10.5)

## 2016-11-17 LAB — RETICULOCYTES
RBC.: 1.67 MIL/uL — ABNORMAL LOW (ref 3.87–5.11)
Retic Count, Absolute: 203.7 10*3/uL — ABNORMAL HIGH (ref 19.0–186.0)
Retic Ct Pct: 12.2 % — ABNORMAL HIGH (ref 0.4–3.1)

## 2016-11-17 LAB — BASIC METABOLIC PANEL
Anion gap: 8 (ref 5–15)
BUN: <5 mg/dL — ABNORMAL LOW (ref 6–20)
CHLORIDE: 109 mmol/L (ref 101–111)
CO2: 22 mmol/L (ref 22–32)
CREATININE: 0.45 mg/dL (ref 0.44–1.00)
Calcium: 8.3 mg/dL — ABNORMAL LOW (ref 8.9–10.3)
GFR calc non Af Amer: >60 mL/min (ref >60)
Glucose, Bld: 104 mg/dL — ABNORMAL HIGH (ref 65–99)
Potassium: 2.6 mmol/L — CL (ref 3.5–5.1)
Sodium: 139 mmol/L (ref 135–145)

## 2016-11-17 LAB — MAGNESIUM: Magnesium: 1.8 mg/dL (ref 1.7–2.4)

## 2016-11-17 MED ORDER — HYDROMORPHONE 1 MG/ML IV SOLN: INTRAVENOUS | Status: DC

## 2016-11-17 MED ORDER — DIPHENHYDRAMINE HCL 50 MG/ML IJ SOLN
25.0000 mg | Freq: Once | INTRAMUSCULAR | Status: AC
  Administered 2016-11-17: 25 mg via INTRAVENOUS
  Filled 2016-11-17 (×3): qty 0.5

## 2016-11-17 MED ORDER — DIPHENHYDRAMINE HCL 50 MG/ML IJ SOLN: 25.0000 mg | INTRAMUSCULAR | Status: DC | PRN

## 2016-11-17 MED ORDER — POTASSIUM CHLORIDE CRYS ER 20 MEQ PO TBCR
40.0000 meq | EXTENDED_RELEASE_TABLET | Freq: Two times a day (BID) | ORAL | Status: DC
  Administered 2016-11-18: 40 meq via ORAL
  Administered 2016-11-18: 20 meq via ORAL
  Administered 2016-11-20: 40 meq via ORAL
  Filled 2016-11-17 (×7): qty 2

## 2016-11-17 MED ORDER — POTASSIUM CHLORIDE 10 MEQ/100ML IV SOLN
10.0000 meq | INTRAVENOUS | Status: AC
  Administered 2016-11-17 (×2): 10 meq via INTRAVENOUS
  Filled 2016-11-17 (×2): qty 100

## 2016-11-17 MED ORDER — HYDROMORPHONE 1 MG/ML IV SOLN
INTRAVENOUS | Status: DC
  Administered 2016-11-17: 21:00:00 via INTRAVENOUS
  Administered 2016-11-18: 11.5 mg via INTRAVENOUS
  Administered 2016-11-18: 6.5 mg via INTRAVENOUS
  Administered 2016-11-18: 13 mg via INTRAVENOUS
  Administered 2016-11-18 – 2016-11-19 (×2): 4.5 mg via INTRAVENOUS
  Administered 2016-11-19 (×2): 7 mg via INTRAVENOUS
  Administered 2016-11-19: 5.5 mg via INTRAVENOUS
  Filled 2016-11-17 (×3): qty 25

## 2016-11-17 NOTE — Progress Notes (Signed)
SICKLE CELL SERVICE PROGRESS NOTE  Brittany Foster ZOX:[id number] DOB: [birth year] DOA: 11/14/2016 PCP: Bing Neighbors, FNP  Assessment/Plan: Principal Problem:   Sickle cell crisis (HCC) Active Problems:   Anemia  1. Hb SS with Crisis: increase PCA to bolus of 0.5 mg and continue Toradol to 30 mg . Decrease IVF to [facility] 2. Anemia of Chronic Disease: Hb was at baseline yesterday with an adequate reticulocyte response. Labs pending from today. Continue Hydrea. Increase Folate to 2 mg.  3. Depression: Continue Abilify and Cymbalta. 4. Chronic Pain Syndrome: Pt was on MS Contin but had it last prescribed on 08/14/2016. She is currently not on it chronically as she has no provider to prescribe. Will not resume.  5. Hypokalemia: Being orally replaced. Labs pending form today.   Code Status: Full Code Family Communication: N/A Disposition Plan: Not yet ready for discharge  [name],[name]  Pager [phone]. If 7PM-7AM, please contact night-coverage.  11/17/2016, 2:02 PM  LOS: 3 days   Interim History: Pt reports feeling weak today.She said that the weakness started yesterday evening and she did not tell anyone until she reported it today. She also reports pain in left ribs and bilateral hips at an intensity of 9/10. She has used 24 mg of Dilaudid on the PCA with 79/60:demands/deliveries in the last 24 hours.   Consultants:  None  Procedures:  None  Antibiotics:  None    Objective: Vitals:   11/17/16 0744 11/17/16 0800 11/17/16 1134 11/17/16 1159  BP: (!) 101/56   (!) 92/52  Pulse: (!) 102   84  Resp: Temp: 98.8 F (37.1 C)   98.6 F (37 C)  TempSrc: Oral   Oral  SpO2: 94% 95% 95% 94%  Weight:      Height:       Weight change:   Intake/Output Summary (Last 24 hours) at 11/17/16 1402 Last data filed at 11/17/16 0932  Gross per 24 hour  Intake          1875.42 ml  Output                0 ml  Net          1875.42 ml     Physical Exam General:  Alert, awake, oriented x3, in no acute distress.  HEENT: [place]/AT PEERL, EOMI, anicteric Heart: Regular rate and rhythm, II/VI non-radiating systolic ejection murmur at base. No rubs, gallops, PMI non-displaced, no heaves or thrills on palpation.  Lungs: Clear to auscultation, no wheezing or rhonchi noted. No increased vocal fremitus resonant to percussion  Abdomen: Soft, nontender, nondistended, positive bowel sounds, no masses no hepatosplenomegaly noted.  Neuro: No focal neurological deficits noted cranial nerves II through XII grossly intact. Strength at functional baseline in bilateral upper and lower extremities. Musculoskeletal: No warmth swelling or erythema around joints, no spinal tenderness noted. Psychiatric: Patient alert and oriented x3, good insight and cognition, good recent to remote recall.     Data Reviewed: Basic Metabolic Panel:  Recent Labs Lab 11/15/16 0100 11/16/16 0626  NA 139 141  K 2.8* 3.0*  CL 110 111  CO2 21* 23  GLUCOSE 128* 123*  BUN 7 6  CREATININE 0.44 0.52  CALCIUM 8.7* 8.5*   Liver Function Tests:  Recent Labs Lab 11/15/16 0100 11/16/16 0626  AST 34 34  ALT 21 22  ALKPHOS 93 92  BILITOT 3.2* 2.3*  PROT 6.8 6.4*  ALBUMIN 3.7 3.4*   No results for input(s):  LIPASE, AMYLASE in the last 168 hours. No results for input(s): AMMONIA in the last 168 hours. CBC:  Recent Labs Lab 11/15/16 0100 11/16/16 0626  WBC 8.9 8.4  NEUTROABS 3.8 3.9  HGB 7.7* 6.9*  HCT 21.5* 18.8*  MCV 105.9* 107.4*  PLT 172 149*   Cardiac Enzymes:  Recent Labs Lab 11/15/16 0100  TROPONINI <0.03   BNP (last 3 results) No results for input(s): BNP in the last 8760 hours.  ProBNP (last 3 results) No results for input(s): PROBNP in the last 8760 hours.  CBG: No results for input(s): GLUCAP in the last 168 hours.  No results found for this or any previous visit (from the past 240 hour(s)).   Studies: Dg Chest 2 View  Result Date: 11/14/2016 CLINICAL  DATA:  Left-sided rib and chest pain. History of sickle cell. EXAM: CHEST  2 VIEW COMPARISON:  08/22/2016 FINDINGS: The heart size and mediastinal contours are within normal limits. Right-sided catheter tip is seen in the mid SVC as before. Both lungs are clear. The visualized skeletal structures are unremarkable. IMPRESSION: No active cardiopulmonary disease. Electronically Signed   By: Tollie Eth M.D.   On: 11/14/2016 20:47    Scheduled Meds: . ARIPiprazole  10 mg Oral Daily  . DULoxetine  60 mg Oral Daily  . enoxaparin (LOVENOX) injection  40 mg Subcutaneous QHS  . folic acid  1 mg Oral Daily  . HYDROmorphone   Intravenous Q4H  . hydroxyurea  1,500 mg Oral Daily  . ketorolac  30 mg Intravenous Q6H  . senna-docusate  1 tablet Oral BID  . topiramate  50 mg Oral BID   Continuous Infusions: . dextrose 5 % and 0.45% NaCl 75 mL/hr at 11/17/16 0403    Principal Problem:   Sickle cell crisis (HCC) Active Problems:   Anemia     In excess of 25 minutes spent during this visit. Greater than 50% involved face to face contact with the patient for assessment, counseling and coordination of care.

## 2016-11-17 NOTE — Progress Notes (Signed)
CRITICAL VALUE ALERT  Critical Value:  K+ 2.6   Date & Time Notied:  11/17/16 1531  Provider Notified: Dr. Ashley Royalty   Orders Received/Actions taken: New orders noted.

## 2016-11-18 DIAGNOSIS — F54 Psychological and behavioral factors associated with disorders or diseases classified elsewhere: Secondary | ICD-10-CM

## 2016-11-18 DIAGNOSIS — F329 Major depressive disorder, single episode, unspecified: Secondary | ICD-10-CM

## 2016-11-18 DIAGNOSIS — E876 Hypokalemia: Secondary | ICD-10-CM

## 2016-11-18 LAB — BASIC METABOLIC PANEL
Anion gap: 4 — ABNORMAL LOW (ref 5–15)
BUN: 6 mg/dL (ref 6–20)
CALCIUM: 8.6 mg/dL — AB (ref 8.9–10.3)
CHLORIDE: 112 mmol/L — AB (ref 101–111)
CO2: 23 mmol/L (ref 22–32)
CREATININE: 0.49 mg/dL (ref 0.44–1.00)
GFR calc Af Amer: >60 mL/min (ref >60)
GFR calc non Af Amer: >60 mL/min (ref >60)
GLUCOSE: 114 mg/dL — AB (ref 65–99)
Potassium: 3 mmol/L — ABNORMAL LOW (ref 3.5–5.1)
Sodium: 139 mmol/L (ref 135–145)

## 2016-11-18 MED ORDER — SODIUM CHLORIDE 0.9% FLUSH
10.0000 mL | INTRAVENOUS | Status: DC | PRN
  Administered 2016-11-21: 10 mL
  Filled 2016-11-18: qty 40

## 2016-11-18 MED ORDER — DIPHENHYDRAMINE HCL 50 MG PO CAPS: 50.0000 mg | ORAL_CAPSULE | Freq: Once | ORAL | Status: DC

## 2016-11-18 MED ORDER — POTASSIUM CHLORIDE 20 MEQ/15ML (10%) PO SOLN
20.0000 meq | Freq: Once | ORAL | Status: AC
  Administered 2016-11-18: 20 meq via ORAL
  Filled 2016-11-18: qty 15

## 2016-11-18 MED ORDER — SODIUM CHLORIDE 0.9 % IV SOLN
25.0000 mg | Freq: Once | INTRAVENOUS | Status: AC
  Administered 2016-11-18: 25 mg via INTRAVENOUS
  Filled 2016-11-18 (×2): qty 0.5

## 2016-11-18 NOTE — Progress Notes (Signed)
SICKLE CELL SERVICE PROGRESS NOTE  Brittany Foster GNF:[id number] DOB: Apr 20, 1985 DOA: 11/14/2016 PCP: Bing Neighbors, FNP  Assessment/Plan: Principal Problem:   Sickle cell crisis (HCC) Active Problems:   Anemia  1. Hb SS with Crisis:Continue PCA  bolus at 0.5 mg . 2. Anemia of Chronic Disease: Hb is stable but reticulocyte response decreased. Will hold Hydrea. Check labs tomorrow.  Continue Folate 2 mg daily.  3. Depression: Continue Abilify and Cymbalta. 4. Chronic Pain Syndrome: Pt was on MS Contin but had it last prescribed on 08/14/2016. She is currently not on it chronically as she has no provider to prescribe. Will not resume.  5. Hypokalemia: Potassium still low. Being orally replaced but patient refusing both oral and IV.    Code Status: Full Code Family Communication: N/A Disposition Plan: Not yet ready for discharge  Brittany Foster A.  Pager [phone]. If 7PM-7AM, please contact night-coverage.  11/18/2016, 2:40 PM  LOS: 4 days   Interim History: Pt states that she feels less weak than she did yesterday. She reports pain as unchanged from yesterday (9/10) and localized to left ribs and B/L hips. Pt has used 34.68 mg of Dilaudid with 75/71:demands/delivereis in the last 24 hours.  I discussed with patient that she has used an MME of >680 mg for several days without any subjective improvement in pain. Thus it would be appropriate to start de-escalating therapy. She was not in agreement and then started demanding increased opiates and IV Benadryl. She was accusatory stating that I had "told everyone not to give her IV Benadryl". I tried to explain the IV to PO policy surrounding Benadryl but she was unwilling to have a rational conversation and continued with her verbal abuse.   Nurse also reported that patient had refused her Potassium. I explained to patient that her potassium was still lower than normal and that achieving a normal potassium level was important in vital  functioning as well as in mitigating pain. I also offered IV Potassium and she refused also stating that it burns too much. She was willing to take the Potassium later.     Consultants:  None  Procedures:  None  Antibiotics:  None    Objective: Vitals:   11/18/16 0814 11/18/16 0816 11/18/16 1117 11/18/16 1200  BP: (!) 90/45 (!) 90/45    Pulse: 91 91 92   Resp: Temp: 98.7 F (37.1 C) 98.1 F (36.7 C)    TempSrc: Oral Oral Oral   SpO2: 97% 97% 94% 94%  Weight:      Height:       Weight change:   Intake/Output Summary (Last 24 hours) at 11/18/16 1440 Last data filed at 11/18/16 0200  Gross per 24 hour  Intake          1207.42 ml  Output                0 ml  Net          1207.42 ml     Physical Exam General: Alert, awake, oriented x3, in no acute distress.  HEENT: /AT PEERL, EOMI, anicteric Heart: Regular rate and rhythm, II/VI non-radiating systolic ejection murmur at base. No rubs, gallops, PMI non-displaced, no heaves or thrills on palpation.  Lungs: Clear to auscultation, no wheezing or rhonchi noted. No increased vocal fremitus resonant to percussion  Abdomen: Soft, nontender, nondistended, positive bowel sounds, no masses no hepatosplenomegaly noted.  Neuro: No focal neurological deficits noted cranial nerves II through  XII grossly intact. Strength at functional baseline in bilateral upper and lower extremities. Musculoskeletal: No warmth swelling or erythema around joints, no spinal tenderness noted. Psychiatric: Patient alert and oriented x3, good insight and cognition, good recent to remote recall.     Data Reviewed: Basic Metabolic Panel:  Recent Labs Lab 11/15/16 0100 11/16/16 0626 11/17/16 1431 11/18/16 1303  NA 139 141 139 139  K 2.8* 3.0* 2.6* 3.0*  CL 110 111 109 112*  CO2 21* GLUCOSE 128* 123* 104* 114*  BUN 7 6 <5* 6  CREATININE 0.44 0.52 0.45 0.49  CALCIUM 8.7* 8.5* 8.3* 8.6*  MG  --   --  1.8  --    Liver  Function Tests:  Recent Labs Lab 11/15/16 0100 11/16/16 0626  AST 34 34  ALT 21 22  ALKPHOS 93 92  BILITOT 3.2* 2.3*  PROT 6.8 6.4*  ALBUMIN 3.7 3.4*   No results for input(s): LIPASE, AMYLASE in the last 168 hours. No results for input(s): AMMONIA in the last 168 hours. CBC:  Recent Labs Lab 11/15/16 0100 11/16/16 0626 11/17/16 1431  WBC 8.9 8.4 8.0  NEUTROABS 3.8 3.9 4.1  HGB 7.7* 6.9* 6.5*  HCT 21.5* 18.8* 18.3*  MCV 105.9* 107.4* 109.6*  PLT 172 149* 134*   Cardiac Enzymes:  Recent Labs Lab 11/15/16 0100  TROPONINI <0.03   BNP (last 3 results) No results for input(s): BNP in the last 8760 hours.  ProBNP (last 3 results) No results for input(s): PROBNP in the last 8760 hours.  CBG: No results for input(s): GLUCAP in the last 168 hours.  No results found for this or any previous visit (from the past 240 hour(s)).   Studies: Dg Chest 2 View  Result Date: 11/14/2016 CLINICAL DATA:  Left-sided rib and chest pain. History of sickle cell. EXAM: CHEST  2 VIEW COMPARISON:  08/22/2016 FINDINGS: The heart size and mediastinal contours are within normal limits. Right-sided catheter tip is seen in the mid SVC as before. Both lungs are clear. The visualized skeletal structures are unremarkable. IMPRESSION: No active cardiopulmonary disease. Electronically Signed   By: Tollie Eth M.D.   On: 11/14/2016 20:47    Scheduled Meds: . ARIPiprazole  10 mg Oral Daily  . diphenhydrAMINE  50 mg Oral Once  . DULoxetine  60 mg Oral Daily  . enoxaparin (LOVENOX) injection  40 mg Subcutaneous QHS  . folic acid  1 mg Oral Daily  . HYDROmorphone   Intravenous Q4H  . hydroxyurea  1,500 mg Oral Daily  . ketorolac  30 mg Intravenous Q6H  . potassium chloride  40 mEq Oral BID  . senna-docusate  1 tablet Oral BID  . topiramate  50 mg Oral BID   Continuous Infusions: . dextrose 5 % and 0.45% NaCl 20 mL/hr at 11/18/16 0530    Principal Problem:   Sickle cell crisis (HCC) Active  Problems:   Anemia     In excess of 25 minutes spent during this visit. Greater than 50% involved face to face contact with the patient for assessment, counseling and coordination of care.

## 2016-11-19 DIAGNOSIS — R269 Unspecified abnormalities of gait and mobility: Secondary | ICD-10-CM

## 2016-11-19 LAB — BASIC METABOLIC PANEL
ANION GAP: 7 (ref 5–15)
BUN: 7 mg/dL (ref 6–20)
CALCIUM: 8.6 mg/dL — AB (ref 8.9–10.3)
CO2: 21 mmol/L — AB (ref 22–32)
Chloride: 112 mmol/L — ABNORMAL HIGH (ref 101–111)
Creatinine, Ser: 0.51 mg/dL (ref 0.44–1.00)
GFR calc Af Amer: >60 mL/min (ref >60)
GFR calc non Af Amer: >60 mL/min (ref >60)
GLUCOSE: 121 mg/dL — AB (ref 65–99)
Potassium: 4 mmol/L (ref 3.5–5.1)
Sodium: 140 mmol/L (ref 135–145)

## 2016-11-19 LAB — CBC WITH DIFFERENTIAL/PLATELET
Basophils Absolute: 0 10*3/uL (ref 0.0–0.1)
Basophils Relative: 0 %
EOS ABS: 0.1 10*3/uL (ref 0.0–0.7)
Eosinophils Relative: 1 %
HEMATOCRIT: 18.7 % — AB (ref 36.0–46.0)
Hemoglobin: 6.7 g/dL — CL (ref 12.0–15.0)
LYMPHS ABS: 3.9 10*3/uL (ref 0.7–4.0)
Lymphocytes Relative: 47 %
MCH: 40.1 pg — ABNORMAL HIGH (ref 26.0–34.0)
MCHC: 35.8 g/dL (ref 30.0–36.0)
MCV: 112 fL — ABNORMAL HIGH (ref 78.0–100.0)
MONO ABS: 0.1 10*3/uL (ref 0.1–1.0)
Monocytes Relative: 1 %
NEUTROS ABS: 4.2 10*3/uL (ref 1.7–7.7)
Neutrophils Relative %: 51 %
PLATELETS: 138 10*3/uL — AB (ref 150–400)
RBC: 1.67 MIL/uL — AB (ref 3.87–5.11)
RDW: 24.9 % — AB (ref 11.5–15.5)
WBC: 8.3 10*3/uL (ref 4.0–10.5)

## 2016-11-19 LAB — RETICULOCYTES
RBC.: 1.67 MIL/uL — AB (ref 3.87–5.11)
RETIC CT PCT: 15 % — AB (ref 0.4–3.1)
Retic Count, Absolute: 250.5 10*3/uL — ABNORMAL HIGH (ref 19.0–186.0)

## 2016-11-19 MED ORDER — OXYCODONE HCL 5 MG PO TABS
15.0000 mg | ORAL_TABLET | ORAL | Status: DC
  Administered 2016-11-19 – 2016-11-21 (×11): 15 mg via ORAL
  Filled 2016-11-19 (×11): qty 3

## 2016-11-19 MED ORDER — HYDROMORPHONE 1 MG/ML IV SOLN
INTRAVENOUS | Status: DC
  Administered 2016-11-19: 4.5 mg via INTRAVENOUS
  Administered 2016-11-19: 6 mg via INTRAVENOUS
  Administered 2016-11-19 – 2016-11-20 (×2): via INTRAVENOUS
  Administered 2016-11-20: 4.5 mg via INTRAVENOUS
  Administered 2016-11-20: 5 mg via INTRAVENOUS
  Administered 2016-11-20: 6.5 mg via INTRAVENOUS
  Administered 2016-11-20: 15.38 mg via INTRAVENOUS
  Administered 2016-11-21: 4.5 mg via INTRAVENOUS
  Administered 2016-11-21: 3.5 mg via INTRAVENOUS
  Administered 2016-11-21: 10.39 mg via INTRAVENOUS
  Filled 2016-11-19 (×3): qty 25

## 2016-11-19 NOTE — Progress Notes (Signed)
SICKLE CELL SERVICE PROGRESS NOTE  Brittany Foster ZOX:[id number] DOB: 11-17-1985 DOA: 11/14/2016 PCP: Bing Neighbors, FNP  Assessment/Plan: Principal Problem:   Sickle cell crisis (HCC) Active Problems:   Anemia  1. Hb SS with Crisis: Schedule oxycodone 15 mg every 6 hours and increase  PCA  bolus of 0.5 mg intervals to 12 minutes thus decreasing frequency of boluses in an hour. 2. Anemia of Chronic Disease: Hb is stable but reticulocyte response decreased. Will hold Hydrea. Check labs tomorrow.  Continue Folate 2 mg daily.  3. Depression: Continue Abilify and Cymbalta. 4. Chronic Pain Syndrome: Pt was on MS Contin but had it last prescribed on 08/14/2016. She is currently not on it chronically as she has no provider to prescribe. Will not resume.  5. Hypokalemia: Potassium now within therapeutic range continue oral supplementation.   Code Status: Full Code Family Communication: N/A Disposition Plan: Anticipate discharge in 24-48 hours  [name],[name]  Pager [phone]. If 7PM-7AM, please contact night-coverage.  11/19/2016, 5:25 PM  LOS: 5 days   Interim History: Pt reports pain in hips and posterior chest wall. She is use 40 mg of Dilaudid on the PCA with 80/80: Demands/deliveries. Patient had a bowel movement today. Encourage ambulation.  Consultants:  None  Procedures:  None  Antibiotics:  None    Objective: Vitals:   11/19/16 1001 11/19/16 1200 11/19/16 1411 11/19/16 1600  BP: (!) 95/57  (!) 92/54   Pulse: 93  92   Resp: 11 14 (!) 22 15  Temp:   98.1 F (36.7 C)   TempSrc:   Oral   SpO2: 98% 97% 99% 97%  Weight:      Height:       Weight change:   Intake/Output Summary (Last 24 hours) at 11/19/16 1725 Last data filed at 11/19/16 0850  Gross per 24 hour  Intake              120 ml  Output                0 ml  Net              120 ml     Physical Exam General: Alert, awake, oriented x3, in no apparent distress.  HEENT: [place]/AT PEERL, EOMI,  anicteric Heart: Regular rate and rhythm, II/VI non-radiating systolic ejection murmur at base. No rubs, gallops, PMI non-displaced, no heaves or thrills on palpation.  Lungs: Clear to auscultation, no wheezing or rhonchi noted. No increased vocal fremitus resonant to percussion  Abdomen: Soft, nontender, nondistended, positive bowel sounds, no masses no hepatosplenomegaly noted.  Neuro: No focal neurological deficits noted cranial nerves II through XII grossly intact. Strength at functional baseline in bilateral upper and lower extremities. Musculoskeletal: No warmth swelling or erythema around joints, no spinal tenderness noted. Psychiatric: Patient alert and oriented x3, good insight and cognition, good recent to remote recall.     Data Reviewed: Basic Metabolic Panel:  Recent Labs Lab 11/15/16 0100 11/16/16 0626 11/17/16 1431 11/18/16 1303 11/19/16 0400  NA 139 141 139 139 140  K 2.8* 3.0* 2.6* 3.0* 4.0  CL 110 111 109 112* 112*  CO2 21* 21*  GLUCOSE 128* 123* 104* 114* 121*  BUN 7 6 <5* 6 7  CREATININE 0.44 0.52 0.45 0.49 0.51  CALCIUM 8.7* 8.5* 8.3* 8.6* 8.6*  MG  --   --  1.8  --   --    Liver Function Tests:  Recent Labs Lab 11/15/16  0100 11/16/16 0626  AST 34 34  ALT 21 22  ALKPHOS 93 92  BILITOT 3.2* 2.3*  PROT 6.8 6.4*  ALBUMIN 3.7 3.4*   No results for input(s): LIPASE, AMYLASE in the last 168 hours. No results for input(s): AMMONIA in the last 168 hours. CBC:  Recent Labs Lab 11/15/16 0100 11/16/16 0626 11/17/16 1431 11/19/16 0400  WBC 8.9 8.4 8.0 8.3  NEUTROABS 3.8 3.9 4.1 4.2  HGB 7.7* 6.9* 6.5* 6.7*  HCT 21.5* 18.8* 18.3* 18.7*  MCV 105.9* 107.4* 109.6* 112.0*  PLT 172 149* 134* 138*   Cardiac Enzymes:  Recent Labs Lab 11/15/16 0100  TROPONINI <0.03   BNP (last 3 results) No results for input(s): BNP in the last 8760 hours.  ProBNP (last 3 results) No results for input(s): PROBNP in the last 8760 hours.  CBG: No  results for input(s): GLUCAP in the last 168 hours.  No results found for this or any previous visit (from the past 240 hour(s)).   Studies: Dg Chest 2 View  Result Date: 11/14/2016 CLINICAL DATA:  Left-sided rib and chest pain. History of sickle cell. EXAM: CHEST  2 VIEW COMPARISON:  08/22/2016 FINDINGS: The heart size and mediastinal contours are within normal limits. Right-sided catheter tip is seen in the mid SVC as before. Both lungs are clear. The visualized skeletal structures are unremarkable. IMPRESSION: No active cardiopulmonary disease. Electronically Signed   By: Tollie Eth M.D.   On: 11/14/2016 20:47    Scheduled Meds: . ARIPiprazole  10 mg Oral Daily  . diphenhydrAMINE  50 mg Oral Once  . DULoxetine  60 mg Oral Daily  . enoxaparin (LOVENOX) injection  40 mg Subcutaneous QHS  . folic acid  1 mg Oral Daily  . HYDROmorphone   Intravenous Q4H  . hydroxyurea  1,500 mg Oral Daily  . ketorolac  30 mg Intravenous Q6H  . oxyCODONE  15 mg Oral Q4H  . potassium chloride  40 mEq Oral BID  . senna-docusate  1 tablet Oral BID  . topiramate  50 mg Oral BID   Continuous Infusions: . dextrose 5 % and 0.45% NaCl 20 mL/hr at 11/18/16 0530    Principal Problem:   Sickle cell crisis (HCC) Active Problems:   Anemia     In excess of 25 minutes spent during this visit. Greater than 50% involved face to face contact with the patient for assessment, counseling and coordination of care.

## 2016-11-19 NOTE — Progress Notes (Signed)
Patient's oxygen saturation at rest 97% on room air and 100 HR.   Patient's oxygen saturation after ambulation 100% on room air and 104 HR.

## 2016-11-19 NOTE — Care Management Note (Signed)
Case Management Note  Patient Details  Name: Anishka Bushard MRN: [id number] Date of Birth: 10-Jan-1986  Subjective/Objective:                  ssc Iv pain meds  Action/Plan: Date:  November 19, 2016 Chart reviewed for concurrent status and case management needs.  Will continue to follow patient progress.  Discharge Planning: following for needs  Expected discharge date: [id number]  Marcelle Smiling, BSN, [place], [place]   [phone]   Expected Discharge Date:                  Expected Discharge Plan:  Home/Self Care  In-House Referral:     Discharge planning Services  CM Consult  Post Acute Care Choice:    Choice offered to:     DME Arranged:    DME Agency:     HH Arranged:    HH Agency:     Status of Service:  In process, will continue to follow  If discussed at Long Length of Stay Meetings, dates discussed:    Additional Comments:  Golda Acre, RN 11/19/2016, 9:49 AM

## 2016-11-19 NOTE — Progress Notes (Signed)
Patient refused Toradol. States she is itching and would like IV benadryl. States she would like for the doctor to call the patient in her room to explain to the patient why she can not have IV benadryl. MD on call paged. MD called writer nurse back and stated that patient has an order for PO benadryl that she can have and she is going into a meeting and will not be able to the call the patient. This RN explained to the patient what the MD said and ordered to bring the PO benadryl. Patient stated that "you all are not doing anything for me and that I plan to leave AMA. I don't understand why you won't give me IV benadryl." I explained to the patient that I can not go against the doctor's order. Patient started to walk away to go ambulate in the hall way. RN will continue to monitor the patient.

## 2016-11-20 MED ORDER — DIPHENHYDRAMINE HCL 50 MG/ML IJ SOLN: 25.0000 mg | Freq: Once | INTRAMUSCULAR | Status: DC

## 2016-11-20 MED ORDER — DIPHENHYDRAMINE HCL 50 MG/ML IJ SOLN
25.0000 mg | Freq: Once | INTRAMUSCULAR | Status: AC
  Administered 2016-11-20: 25 mg via INTRAVENOUS
  Filled 2016-11-20: qty 0.5

## 2016-11-20 NOTE — Telephone Encounter (Signed)
Opened in error

## 2016-11-20 NOTE — Progress Notes (Signed)
Subjective: 31 year old female with sickle cell painful crisis on chronic pain. Patient also had significant anemia and hypokalemia. Patient's pain is down to 6 out of 10. She is on oral oxycodone with PCA.  Objective: Vital signs in last 24 hours: Temp:  [98.5 F (36.9 C)-98.6 F (37 C)] 98.6 F (37 C) (09/21 1345) Pulse Rate:  [94-99] 99 (09/21 1345) Resp:  [12-20] 17 (09/21 1522) BP: (91-104)/(57-69) 91/57 (09/21 1345) SpO2:  [95 %-100 %] 98 % (09/21 1522) Weight change:  Last BM Date: 11/19/16  Intake/Output from previous day: 09/20 0701 - 09/21 0700 In: 120 [P.O.:120] Out: -  Intake/Output this shift: No intake/output data recorded.  General appearance: alert, cooperative, appears stated age and no distress Neck: no adenopathy, no carotid bruit, no JVD, supple, symmetrical, trachea midline and thyroid not enlarged, symmetric, no tenderness/mass/nodules Back: symmetric, no curvature. ROM normal. No CVA tenderness. Resp: clear to auscultation bilaterally Cardio: regular rate and rhythm, S1, S2 normal, no murmur, click, rub or gallop GI: soft, non-tender; bowel sounds normal; no masses,  no organomegaly Extremities: extremities normal, atraumatic, no cyanosis or edema Pulses: 2+ and symmetric Skin: Skin color, texture, turgor normal. No rashes or lesions Neurologic: Grossly normal  Lab Results:  Recent Labs  11/19/16 0400  WBC 8.3  HGB 6.7*  HCT 18.7*  PLT 138*   BMET  Recent Labs  11/18/16 1303 11/19/16 0400  NA 139 140  K 3.0* 4.0  CL 112* 112*  CO2 23 21*  GLUCOSE 114* 121*  BUN 6 7  CREATININE 0.49 0.51  CALCIUM 8.6* 8.6*    Studies/Results: No results found.  Medications: I have reviewed the patient's current medications.  Assessment/Plan: A 31 year old female being admitted with sickle cell painful crisis.  #1 sickle cell painful crisis: Patient is doing better on current regimen. I will not change it but plan for discharge within the next  24 hours. Encourage mobility.  #2 anemia of chronic disease: Hemoglobin has stayed at 6.7 which is close to her baseline. No transfusion required at this point  #3 depression: Patient on Abilify and Cymbalta. Continue current medications.  #4 chronic pain syndrome: Patient will again be on MS Contin and oxycodone. We will give her refill of the medications until she is seen in the clinic at time of discharge  #5 hypokalemia: Potassium has been repleted.  LOS: 6 days   GARBA,LAWAL 11/20/2016, 7:56 PM

## 2016-11-21 ENCOUNTER — Other Ambulatory Visit: Payer: Self-pay | Admitting: Internal Medicine

## 2016-11-21 LAB — CREATININE, SERUM
Creatinine, Ser: 0.45 mg/dL (ref 0.44–1.00)
GFR calc Af Amer: >60 mL/min (ref >60)

## 2016-11-21 MED ORDER — HYDROMORPHONE HCL-NACL 0.5-0.9 MG/ML-% IV SOSY
1.0000 mg | PREFILLED_SYRINGE | Freq: Once | INTRAVENOUS | Status: AC
  Administered 2016-11-21: 1 mg via INTRAVENOUS
  Filled 2016-11-21: qty 2

## 2016-11-21 MED ORDER — MORPHINE SULFATE ER 30 MG PO TBCR: 30.0000 mg | EXTENDED_RELEASE_TABLET | Freq: Two times a day (BID) | ORAL | 0 refills | Status: AC

## 2016-11-21 MED ORDER — OXYCODONE-ACETAMINOPHEN 10-325 MG PO TABS: 1.0000 | ORAL_TABLET | Freq: Four times a day (QID) | ORAL | 0 refills | Status: AC | PRN

## 2016-11-21 MED ORDER — DIPHENHYDRAMINE HCL 50 MG/ML IJ SOLN
12.5000 mg | Freq: Once | INTRAMUSCULAR | Status: AC
  Administered 2016-11-21: 12.5 mg via INTRAVENOUS
  Filled 2016-11-21: qty 1

## 2016-11-21 MED ORDER — HEPARIN SOD (PORK) LOCK FLUSH 100 UNIT/ML IV SOLN
250.0000 [IU] | INTRAVENOUS | Status: AC | PRN
  Administered 2016-11-21: 250 [IU]

## 2016-11-21 NOTE — Progress Notes (Signed)
Pt left at this time with family. Alert, oriented, and without c/o. Discharge instructions/prescriptions given/explained with pt verbalizing understanding.   Writer waste  of PCA Dilaudid at this time. Noted by Philippa Chester, RN.

## 2016-11-21 NOTE — Discharge Summary (Signed)
Physician Discharge Summary  Patient ID: Brittany Foster MRN: [id number] DOB/AGE: 32/25/1987 32 y.o.  Admit date: 11/14/2016 Discharge date: 11/21/2016  Admission Diagnoses:  Discharge Diagnoses:  Principal Problem:   Sickle cell crisis [facility]) Active Problems:   Anemia   Discharged Condition: good  Hospital Course: patient admitted with sickle cell painful crisis transferred from [place]. She is complaining of 10 out of 10 pain in her back and lower legs. Also had nausea with no vomiting. Her hemoglobin with a baseline around 7 g. She was initiated on Dilaudid PCA with Toradol and IV fluids. Patient appeared to have been having crisis because she missed her oral medications at home. She lost her prescription privileges from her PCP. At the time of discharge we have given patient 5 days whorled of treatment for pain management. She'll follow up with the PCP and obtain prescription refills. Patient has been going be to multiple hospitals for her pain control.  Consults: None  Significant Diagnostic Studies: labs: serial CBCs and CMP is checked. Her lab work appeared to be at baseline.  Treatments: IV hydration and analgesia: Dilaudid  Discharge Exam: Blood pressure (!) 99/55, pulse 91, temperature 98.8 F (37.1 C), temperature source Oral, resp. rate 16, height 5' (1.524 m), weight 57.6 kg (127 lb), SpO2 98 %. General appearance: alert, cooperative, appears stated age and no distress Head: Normocephalic, without obvious abnormality, atraumatic Neck: no adenopathy, no carotid bruit, no JVD, supple, symmetrical, trachea midline and thyroid not enlarged, symmetric, no tenderness/mass/nodules Back: symmetric, no curvature. ROM normal. No CVA tenderness. Resp: clear to auscultation bilaterally Cardio: regular rate and rhythm, S1, S2 normal, no murmur, click, rub or gallop GI: soft, non-tender; bowel sounds normal; no masses,  no organomegaly Extremities: extremities normal,  atraumatic, no cyanosis or edema Pulses: 2+ and symmetric Skin: Skin color, texture, turgor normal. No rashes or lesions Neurologic: Grossly normal  Disposition: 01-Home or Self Care  Discharge Instructions    Diet - low sodium heart healthy    Complete by:  As directed    Increase activity slowly    Complete by:  As directed      Allergies as of 11/21/2016      Reactions   Ultram [tramadol] Other (See Comments)   Reaction:  Seizures    Zofran [ondansetron Hcl] Nausea And Vomiting   Buprenorphine Hcl Hives, Other (See Comments)   Reaction:  Shaking    Fentanyl Hives   Morphine And Related Hives, Other (See Comments)   Reaction:  Shaking    Tape Rash      Medication List    TAKE these medications   ARIPiprazole 10 MG tablet Commonly known as:  ABILIFY Take 10 mg by mouth daily   DULoxetine 60 MG capsule Commonly known as:  CYMBALTA TAKE 1 CAPSULE BY MOUTH DAILY.   folic acid 1 MG tablet Commonly known as:  FOLVITE Take 1 tablet (1 mg total) by mouth daily.   hydroxyurea 500 MG capsule Commonly known as:  HYDREA TAKE 2 CAPSULES BY MOUTH DAILY. MAY TAKE WITH FOOD TO MINIMIZE GI SIDE EFFECTS. What changed:  how much to take  how to take this  when to take this  additional instructions   ibuprofen 200 MG tablet Commonly known as:  ADVIL,MOTRIN Take 400 mg by mouth every 6 (six) hours as needed.   medroxyPROGESTERone 150 MG/ML injection Commonly known as:  DEPO-PROVERA Inject 150 mg into the muscle every 3 (three) months.   morphine 30 MG 12  hr tablet Commonly known as:  MS CONTIN Take 1 tablet (30 mg total) by mouth every 12 (twelve) hours.   oxyCODONE-acetaminophen 10-325 MG tablet Commonly known as:  PERCOCET Take 1 tablet by mouth every 6 (six) hours as needed for pain. for pain   Topiramate ER 100 MG Cp24 Commonly known as:  TROKENDI XR Take 200 mg by mouth at bedtime. What changed:  how much to take            Discharge Care Instructions         Start     Ordered   11/21/16 0000  morphine (MS CONTIN) 30 MG 12 hr tablet  Every 12 hours     11/21/16 0845   11/21/16 0000  oxyCODONE-acetaminophen (PERCOCET) 10-325 MG tablet  Every 6 hours PRN     11/21/16 0845   11/21/16 0000  Increase activity slowly     11/21/16 0845   11/21/16 0000  Diet - low sodium heart healthy     11/21/16 0845       Signed: Lonia Blood 11/21/2016, 8:45 AM   Time spent 35 minutes

## 2016-11-25 ENCOUNTER — Encounter (HOSPITAL_COMMUNITY): Payer: Self-pay | Admitting: *Deleted

## 2016-11-25 ENCOUNTER — Telehealth (HOSPITAL_COMMUNITY): Payer: Self-pay | Admitting: *Deleted

## 2016-11-25 ENCOUNTER — Non-Acute Institutional Stay (HOSPITAL_COMMUNITY)
Admission: AD | Admit: 2016-11-25 | Discharge: 2016-11-25 | Disposition: A | Discharge status: Home / Self Care | Payer: Medicaid Other | Source: Ambulatory Visit | Attending: Internal Medicine | Admitting: Internal Medicine

## 2016-11-25 DIAGNOSIS — D5700 Hb-SS disease with crisis, unspecified: Secondary | ICD-10-CM | POA: Diagnosis not present

## 2016-11-25 DIAGNOSIS — Z9049 Acquired absence of other specified parts of digestive tract: Secondary | ICD-10-CM | POA: Insufficient documentation

## 2016-11-25 DIAGNOSIS — Z79899 Other long term (current) drug therapy: Secondary | ICD-10-CM | POA: Diagnosis not present

## 2016-11-25 DIAGNOSIS — Z832 Family history of diseases of the blood and blood-forming organs and certain disorders involving the immune mechanism: Secondary | ICD-10-CM | POA: Diagnosis not present

## 2016-11-25 DIAGNOSIS — D649 Anemia, unspecified: Secondary | ICD-10-CM | POA: Insufficient documentation

## 2016-11-25 DIAGNOSIS — G43909 Migraine, unspecified, not intractable, without status migrainosus: Secondary | ICD-10-CM | POA: Insufficient documentation

## 2016-11-25 DIAGNOSIS — F17200 Nicotine dependence, unspecified, uncomplicated: Secondary | ICD-10-CM | POA: Diagnosis not present

## 2016-11-25 DIAGNOSIS — D571 Sickle-cell disease without crisis: Secondary | ICD-10-CM | POA: Diagnosis not present

## 2016-11-25 DIAGNOSIS — F329 Major depressive disorder, single episode, unspecified: Secondary | ICD-10-CM | POA: Insufficient documentation

## 2016-11-25 LAB — URINALYSIS, DIPSTICK ONLY
BILIRUBIN URINE: NEGATIVE
Glucose, UA: NEGATIVE mg/dL
HGB URINE DIPSTICK: NEGATIVE
KETONES UR: NEGATIVE mg/dL
Leukocytes, UA: NEGATIVE
Nitrite: NEGATIVE
PROTEIN: NEGATIVE mg/dL
Specific Gravity, Urine: 1.011 (ref 1.005–1.030)
pH: 6 (ref 5.0–8.0)

## 2016-11-25 LAB — CBC WITH DIFFERENTIAL/PLATELET
BASOS ABS: 0 10*3/uL (ref 0.0–0.1)
BASOS PCT: 0 %
Band Neutrophils: 0 %
Blasts: 0 %
EOS PCT: 0 %
Eosinophils Absolute: 0 10*3/uL (ref 0.0–0.7)
HEMATOCRIT: 22.7 % — AB (ref 36.0–46.0)
Hemoglobin: 8 g/dL — ABNORMAL LOW (ref 12.0–15.0)
Lymphocytes Relative: 34 %
Lymphs Abs: 2.4 10*3/uL (ref 0.7–4.0)
MCH: 42.1 pg — AB (ref 26.0–34.0)
MCHC: 35.2 g/dL (ref 30.0–36.0)
MCV: 119.5 fL — AB (ref 78.0–100.0)
MONO ABS: 0.2 10*3/uL (ref 0.1–1.0)
MONOS PCT: 3 %
Metamyelocytes Relative: 0 %
Myelocytes: 0 %
NEUTROS ABS: 4.6 10*3/uL (ref 1.7–7.7)
NEUTROS PCT: 63 %
NRBC: 55 /100{WBCs} — AB
Platelets: 255 10*3/uL (ref 150–400)
Promyelocytes Absolute: 0 %
RBC: 1.9 MIL/uL — AB (ref 3.87–5.11)
RDW: 23.1 % — AB (ref 11.5–15.5)
WBC: 7.2 10*3/uL (ref 4.0–10.5)

## 2016-11-25 LAB — COMPREHENSIVE METABOLIC PANEL
ALT: 27 U/L (ref 14–54)
ANION GAP: 8 (ref 5–15)
AST: 68 U/L — ABNORMAL HIGH (ref 15–41)
Albumin: 4 g/dL (ref 3.5–5.0)
Alkaline Phosphatase: 100 U/L (ref 38–126)
BUN: 5 mg/dL — ABNORMAL LOW (ref 6–20)
CHLORIDE: 112 mmol/L — AB (ref 101–111)
CO2: 21 mmol/L — AB (ref 22–32)
Calcium: 8.8 mg/dL — ABNORMAL LOW (ref 8.9–10.3)
Creatinine, Ser: 0.51 mg/dL (ref 0.44–1.00)
GFR calc non Af Amer: >60 mL/min (ref >60)
Glucose, Bld: 99 mg/dL (ref 65–99)
POTASSIUM: 3.7 mmol/L (ref 3.5–5.1)
SODIUM: 141 mmol/L (ref 135–145)
Total Bilirubin: 1.6 mg/dL — ABNORMAL HIGH (ref 0.3–1.2)
Total Protein: 7.4 g/dL (ref 6.5–8.1)

## 2016-11-25 LAB — RETICULOCYTES
RBC.: 1.9 MIL/uL — ABNORMAL LOW (ref 3.87–5.11)
RETIC COUNT ABSOLUTE: 273.6 10*3/uL — AB (ref 19.0–186.0)
Retic Ct Pct: 14.4 % — ABNORMAL HIGH (ref 0.4–3.1)

## 2016-11-25 MED ORDER — HYDROMORPHONE HCL 4 MG PO TABS
4.0000 mg | ORAL_TABLET | ORAL | Status: AC
  Administered 2016-11-25: 4 mg via ORAL
  Filled 2016-11-25: qty 1

## 2016-11-25 MED ORDER — HYDROMORPHONE 1 MG/ML IV SOLN
INTRAVENOUS | Status: DC
  Administered 2016-11-25: 1 mg via INTRAVENOUS
  Administered 2016-11-25: 10 mg via INTRAVENOUS
  Filled 2016-11-25: qty 25

## 2016-11-25 MED ORDER — NALOXONE HCL 0.4 MG/ML IJ SOLN: 0.4000 mg | INTRAMUSCULAR | Status: DC | PRN

## 2016-11-25 MED ORDER — ONDANSETRON HCL 4 MG/2ML IJ SOLN
4.0000 mg | Freq: Four times a day (QID) | INTRAMUSCULAR | Status: DC | PRN
  Filled 2016-11-25: qty 2

## 2016-11-25 MED ORDER — HEPARIN SOD (PORK) LOCK FLUSH 100 UNIT/ML IV SOLN
250.0000 [IU] | INTRAVENOUS | Status: DC | PRN
  Administered 2016-11-25: 250 [IU]
  Filled 2016-11-25: qty 5

## 2016-11-25 MED ORDER — DIPHENHYDRAMINE HCL 50 MG/ML IJ SOLN: 12.5000 mg | Freq: Four times a day (QID) | INTRAMUSCULAR | Status: DC | PRN

## 2016-11-25 MED ORDER — KETOROLAC TROMETHAMINE 30 MG/ML IJ SOLN
30.0000 mg | Freq: Once | INTRAMUSCULAR | Status: AC
  Administered 2016-11-25: 30 mg via INTRAVENOUS

## 2016-11-25 MED ORDER — SODIUM CHLORIDE 0.9% FLUSH: 10.0000 mL | INTRAVENOUS | Status: DC | PRN

## 2016-11-25 MED ORDER — KETOROLAC TROMETHAMINE 30 MG/ML IJ SOLN
30.0000 mg | Freq: Once | INTRAMUSCULAR | Status: DC
  Filled 2016-11-25: qty 1

## 2016-11-25 MED ORDER — ALTEPLASE 2 MG IJ SOLR
2.0000 mg | Freq: Once | INTRAMUSCULAR | Status: AC
  Administered 2016-11-25: 2 mg
  Filled 2016-11-25: qty 2

## 2016-11-25 MED ORDER — SODIUM CHLORIDE 0.9% FLUSH: 9.0000 mL | INTRAVENOUS | Status: DC | PRN

## 2016-11-25 MED ORDER — DEXTROSE-NACL 5-0.45 % IV SOLN
INTRAVENOUS | Status: DC
  Administered 2016-11-25: 12:00:00 via INTRAVENOUS

## 2016-11-25 MED ORDER — DIPHENHYDRAMINE HCL 12.5 MG/5ML PO ELIX
12.5000 mg | ORAL_SOLUTION | Freq: Four times a day (QID) | ORAL | Status: DC | PRN
  Administered 2016-11-25: 12.5 mg via ORAL
  Filled 2016-11-25: qty 5

## 2016-11-25 MED ORDER — PROMETHAZINE HCL 25 MG PO TABS
25.0000 mg | ORAL_TABLET | Freq: Once | ORAL | Status: AC
  Administered 2016-11-25: 25 mg via ORAL
  Filled 2016-11-25: qty 1

## 2016-11-25 NOTE — Telephone Encounter (Signed)
Pt [facility]. Pt states her pain is generalized, in her "legs, hips and back". She states her pain is 7/10. She denies fever, chest pain, vomiting, diarrhea or abdominal pain. She is having some nausea. She last took her Oxy and MS contin at 6 am and has been taking them around the clock. Will check with the provider and give her a call back. Pt voiced understanding.

## 2016-11-25 NOTE — Progress Notes (Signed)
Pt came into clinic for sickle cell pain. Placed on a Dilaudid PCA, and IV hydration.  Also received benadryl and Dilaudid PO.  PICC line was TPA and blood return noted. Flushed and heparin locked.  PICC dressing and caps changed.   Pt alert and oriented.  Received discharge instructions and verbalized understanding.

## 2016-11-25 NOTE — Discharge Summary (Signed)
[facility] Discharge Summary   Patient ID: Brittany Foster MRN: [id number] DOB/AGE: 04-Feb-1986 31 y.o.  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   folic acid 1 MG tablet Commonly known as:  FOLVITE Take 1 tablet (1 mg total) by mouth daily.   hydroxyurea 500 MG capsule Commonly known as:  HYDREA TAKE 2 CAPSULES BY MOUTH DAILY. MAY TAKE WITH FOOD TO MINIMIZE GI SIDE EFFECTS. What changed:  how much to take  how to take this  when to take this  additional instructions   ibuprofen 200 MG tablet Commonly known as:  ADVIL,MOTRIN Take 400 mg by mouth every 6 (six) hours as needed.   medroxyPROGESTERone 150 MG/ML injection Commonly known as:  DEPO-PROVERA Inject 150 mg into the muscle every 3 (three) months.   morphine 30 MG 12 hr tablet Commonly known as:  MS CONTIN Take 1 tablet (30 mg total) by mouth every 12 (twelve) hours.   oxyCODONE-acetaminophen 10-325 MG tablet Commonly known as:  PERCOCET Take 1 tablet by mouth every 6 (six) hours as needed for pain. for pain   Topiramate ER 100 MG Cp24 Commonly known as:  TROKENDI XR Take 200 mg by mouth at bedtime. What changed:  how much to take      Consults:  N/A  Significant Diagnostic Studies:   Results for orders placed or performed during the hospital encounter of 11/25/16  Urinalysis, dipstick only  Result Value Ref Range   Color, Urine YELLOW YELLOW   APPearance CLEAR CLEAR   Specific Gravity, Urine 1.011 1.005 - 1.030   pH 6.0 5.0 - 8.0   Glucose, UA NEGATIVE NEGATIVE mg/dL   Hgb urine dipstick NEGATIVE NEGATIVE   Bilirubin Urine NEGATIVE NEGATIVE   Ketones, ur NEGATIVE NEGATIVE mg/dL   Protein, ur NEGATIVE NEGATIVE mg/dL   Nitrite NEGATIVE NEGATIVE   Leukocytes, UA NEGATIVE NEGATIVE  Comprehensive metabolic panel  Result Value Ref Range   Sodium 141 135 - 145 mmol/L   Potassium 3.7 3.5 - 5.1 mmol/L   Chloride 112 (H) 101 - 111 mmol/L   CO2 21 (L) 22 - 32 mmol/L   Glucose, Bld 99 65 - 99 mg/dL   BUN 5 (L) 6 - 20 mg/dL   Creatinine, Ser 6.21 0.44 - 1.00 mg/dL   Calcium 8.8 (L) 8.9 - 10.3 mg/dL   Total Protein 7.4 6.5 - 8.1 g/dL   Albumin 4.0 3.5 - 5.0 g/dL   AST 68 (H) 15 - 41 U/L   ALT 27 14 - 54 U/L   Alkaline Phosphatase 100 38 - 126 U/L   Total Bilirubin 1.6 (H) 0.3 - 1.2 mg/dL   GFR calc non Af  Amer >60 >60 mL/min   GFR calc Af Amer >60 >60 mL/min   Anion gap 8 5 - 15  CBC with Differential/Platelet  Result Value Ref Range   WBC 7.2 4.0 - 10.5 K/uL   RBC 1.90 (L) 3.87 - 5.11 MIL/uL   Hemoglobin 8.0 (L) 12.0 - 15.0 g/dL   HCT 16.1 (L) 09.6 - 04.5 %   MCV 119.5 (H) 78.0 - 100.0 fL   MCH 42.1 (H) 26.0 - 34.0 pg   MCHC 35.2 30.0 - 36.0 g/dL   RDW 40.9 (H) 81.1 - 91.4 %   Platelets 255 150 - 400 K/uL   Neutrophils Relative % 63 %   Lymphocytes Relative 34 %   Monocytes Relative 3 %   Eosinophils Relative 0 %   Basophils Relative 0 %   Band Neutrophils 0 %   Metamyelocytes Relative 0 %   Myelocytes 0 %   Promyelocytes Absolute 0 %   Blasts 0 %   nRBC 55 (H) 0 /100 WBC   Neutro Abs 4.6 1.7 - 7.7 K/uL   Lymphs Abs 2.4 0.7 - 4.0 K/uL   Monocytes Absolute 0.2 0.1 - 1.0 K/uL   Eosinophils Absolute 0.0 0.0 - 0.7 K/uL   Basophils Absolute 0.0 0.0 -  0.1 K/uL   RBC Morphology POLYCHROMASIA PRESENT   Reticulocytes  Result Value Ref Range   Retic Ct Pct 14.4 (H) 0.4 - 3.1 %   RBC. 1.90 (L) 3.87 - 5.11 MIL/uL   Retic Count, Absolute 273.6 (H) 19.0 - 186.0 K/uL     [facility] Course: Brittany Foster a 31y.o.femalewith a diagnosis of Sickle Cell Anemia presents today with a complaint of generalized pain over the last few days. Brittany Foster reports a current pain intensity of 8/10 and pain is characterized as a constant aching.  Brittany Foster reports her pain today is consistent with previous sickle cell pain crisis type pain. She reports attempting  pain relief with MsContin and Oxycodone which did not improve her pain symptoms. Brittany Foster has recently been impatient at [facility] and presented in the ED of multiple health systems with reports of unmanaged sickle cell pain. Brittany Foster at present, denies headache, fever, shortness of breath, chest pain, dysuria, nausea, vomiting, or diarrhea. Brittany Foster is being admitted to the day infusion center for extended observation and the following was the course of treatment : Intravenous D5.45 @ 125 cc/hr administer for cellular rehydration. Toradol 30 mg intravenously for inflammation reduction. Administer PO Dilaudid 4 mg one time dose.  Placed on a High Concentration PCA per weight based protocol for pain control. Patient goal for self management achieved per patient. Current pain intensity 6/10 improved from admission pain intensity of 8/10.Reviewed laboratory values, consistent with baseline. Patient is alert, oriented and ambulatory.  Physical Exam at Discharge: BP 102/66 (BP Location: Left Arm, Patient Position: Sitting)   Pulse 82   Temp 98.3 F (36.8 C) (Oral)   Resp 12   Ht 5' (1.524 m)   Wt 132 lb (59.9 kg)   SpO2 97%   BMI 25.78 kg/m  General Appearance:    Alert, cooperative, no distress, appears stated age  Head:    Normocephalic, without obvious abnormality, atraumatic   Eyes:    PERRL, conjunctiva/corneas clear, EOM's intact  Back:     Symmetric, no curvature, ROM normal, no CVA tenderness  Lungs:     Clear to auscultation bilaterally, respirations unlabored  Chest Wall:    No tenderness or deformity  Heart:    Regular rate and rhythm, S1 and S2 normal, no murmur, rub   or gallop  Abdomen:     Soft, non-tender, bowel sounds active all four quadrants,    no masses, no organomegaly  Extremities:   Extremities normal, atraumatic, no cyanosis or edema  Neurologic:   Normal strength, sensation    Disposition at Discharge: 01-Home or Self Care  Discharge Orders: -Continue to hydrate and take prescribed home medications as ordered. -Resume all home medications. -Keep upcoming appointment  -The patient was given clear instructions to go to ER or return to medical center if symptoms do not improve, worsen or new problems develop. The patient verbalized understanding.  Condition at Discharge:   Stable  Time spent on Discharge:  Greater than 25 minutes.  Signed: Joaquin Courts 11/25/2016, 8:16 PM

## 2016-11-25 NOTE — Telephone Encounter (Signed)
Called patient back after speaking with the provider and she can come in for treatment. Pt stated that she needed to find her ride. She will call back and let the center know if she is unable to get here.

## 2016-11-25 NOTE — Progress Notes (Signed)
Wasted 14 ml of Dilaudid PCA  In sink with Jeannett Senior, RN.

## 2016-11-25 NOTE — Progress Notes (Signed)
Pt received to the [facility] for treatment. She stated her pain was in her legs and groin. She stated her pain was 8/10. She was treated with heat, IV fluids and Dilaudid PCA. Her pain was down to 6/10 at discharge. D/C instructions given with verbal understanding. Pt was alert,oriented and ambulatory at discharge.

## 2016-11-25 NOTE — H&P (Signed)
[facility] History and Physical   Date: 11/25/2016  Patient name: Brittany Foster Medical record number: [id number] Date of birth: [birth year] Age: 31 y.o. Gender: female PCP: Bing Neighbors, FNP  Attending physician: Quentin Angst, MD  Chief Complaint: Generalized Pain  History of Present Illness: Brittany Payneis a 31y.o.femalewith a diagnosis of Sickle Cell Anemia presents today with a complaint of generalized pain over the last few days. Brittany Foster reports a current pain intensity of 8/10 and pain is characterized as a constant aching.  Brittany Foster reports her pain today is consistent with previous sickle cell pain crisis type pain. She reports attempting  pain relief with MsContin and Oxycodone which did not improve her pain symptoms. Brittany Foster has recently been impatient at [facility] and presented in the ED of multiple health systems with reports of unmanaged sickle cell pain. Brittany Foster at present, denies headache, fever, shortness of breath, chest pain, dysuria, nausea, vomiting, or diarrhea. Brittany Foster is being admitted to the day infusion center for extended observation. Meds: Prescriptions Prior to Admission  Medication Sig Dispense Refill Last Dose  . ARIPiprazole (ABILIFY) 10 MG tablet Take 10 mg by mouth daily  3 11/12/2016  . DULoxetine (CYMBALTA) 60 MG capsule TAKE 1 CAPSULE BY MOUTH DAILY. 30 capsule 3 11/12/2016 at Unknown time  . folic acid (FOLVITE) 1 MG tablet Take 1 tablet (1 mg total) by mouth daily. 90 tablet 3 11/12/2016 at Unknown time  . hydroxyurea (HYDREA) 500 MG capsule TAKE 2 CAPSULES BY MOUTH DAILY. MAY TAKE WITH FOOD TO MINIMIZE GI SIDE EFFECTS. (Patient taking differently: Take 1,500 mg by mouth daily. ) 180 capsule 3 11/12/2016 at Unknown time  . ibuprofen (ADVIL,MOTRIN) 200 MG tablet Take 400 mg by mouth every 6 (six) hours as needed.   11/14/2016 at Unknown time  . medroxyPROGESTERone (DEPO-PROVERA) 150 MG/ML injection Inject 150 mg into the  muscle every 3 (three) months.    May  . morphine (MS CONTIN) 30 MG 12 hr tablet Take 1 tablet (30 mg total) by mouth every 12 (twelve) hours. 30 tablet 0   . oxyCODONE-acetaminophen (PERCOCET) 10-325 MG tablet Take 1 tablet by mouth every 6 (six) hours as needed for pain. for pain 90 tablet 0   . Topiramate ER 100 MG CP24 Take 200 mg by mouth at bedtime. (Patient taking differently: Take 100 mg by mouth at bedtime. ) 60 capsule 3 11/12/2016 at Unknown time    Allergies: Ultram [tramadol]; Zofran [ondansetron hcl]; Buprenorphine hcl; Fentanyl; Morphine and related; and Tape Past Medical History:  Diagnosis Date  . Anemia   . Depression, major, recurrent (HCC)   . Migraines   . Sickle cell anemia (HCC)    Past Surgical History:  Procedure Laterality Date  . CESAREAN SECTION    . CHOLECYSTECTOMY  2000  . IR GENERIC HISTORICAL  10/08/2015   IR US GUIDE VASC ACCESS RIGHT 10/08/2015 Simonne Come, MD [facility]  . IR GENERIC HISTORICAL  10/08/2015   IR FLUORO GUIDE CV LINE RIGHT 10/08/2015 Simonne Come, MD [facility]  . MULTIPLE TOOTH EXTRACTIONS N/A   . port a cath placement Right    about 6-7 years ago  . removal of porta cath Right 09/11/15  . TUBAL LIGATION     Family History  Problem Relation Age of Onset  . Sickle cell trait Father   . Sickle cell trait Mother   . Sickle cell anemia Other    Social History   Social History  .  Marital status: Single    Spouse name: N/A  . Number of children: N/A  . Years of education: N/A   Occupational History  . None    Social History Main Topics  . Smoking status: Current Some Day Smoker    Packs/day: 1.00  . Smokeless tobacco: Current User  . Alcohol use No  . Drug use: No  . Sexual activity: Yes    Partners: Male    Birth control/ protection: Injection     Comment: 1 new partner recently, no concern about STI, uses condoms   Other Topics Concern  . Not on file   Social History Narrative   32 year old daughter   Review of  Systems: Constitutional: negative Cardiovascular: negative Gastrointestinal: negative Musculoskeletal:positive for arthralgias Neurological: negative  Physical Exam: Today's Vitals   11/25/16 1159 11/25/16 1209 11/25/16 1506 11/25/16 1611  BP: 105/62 110/64 102/66   Pulse: 84 85 82   Resp: Temp: 99 F (37.2 C) 99 F (37.2 C) 98.3 F (36.8 C)   TempSrc: Oral Oral Oral   SpO2: 97% 94% 97%   Weight:      Height:      PainSc: PainLoc:       General Appearance:    Alert, cooperative, no distress, appears stated age  Head:    Normocephalic, without obvious abnormality, atraumatic  Eyes:    PERRL, conjunctiva/corneas clear, EOM's intact  Back:     Symmetric, no curvature, ROM normal, no CVA tenderness  Lungs:     Clear to auscultation bilaterally, respirations unlabored  Chest Wall:    No tenderness or deformity   Heart:    Regular rate and rhythm, S1 and S2 normal, no murmur, rub   or gallop  Abdomen:     Soft, non-tender, bowel sounds active all four quadrants,    no masses, no organomegaly  Extremities:   Extremities normal, atraumatic, no cyanosis or edema  Neurologic:   Normal strength, sensation    Lab results: -Pending  Imaging results:  No results found.  Assessment & Plan:  Patient will be admitted to the day infusion center for extended observation  Start IV D5.45 for cellular rehydration at 125/hr  Start Toradol 30 mg IV every 6 hours for inflammation.  Start Dilaudid PCA High Concentration per weight based protocol.   Patient will be re-evaluated for pain intensity in the context of function and relationship to baseline as care progresses.  If no significant pain relief, will transfer patient to inpatient services for a higher level of care.   Will check CMP,  CBC w/differential, and Reticulocytes   Joaquin Courts 11/25/2016, 9:54 AM

## 2016-11-26 ENCOUNTER — Telehealth (HOSPITAL_COMMUNITY): Payer: Self-pay | Admitting: *Deleted

## 2016-11-26 ENCOUNTER — Telehealth: Payer: Self-pay

## 2016-11-26 NOTE — Telephone Encounter (Signed)
Called patient back after speaking with the provider and she was told to stay home, take her meds around the clock and stay hydrated. Pt voiced understanding.

## 2016-11-26 NOTE — Telephone Encounter (Signed)
Pt called requesting to be treated for pain at the [facility]. Pt stated her pain was 6/10 and that she had taken her prn pain meds at 630 am and her MS contin at 4am. She stated that she had been taking her meds around the clock. She denies fever, chest pain, vomiting, diarrhea or abd pain.She states she is nauseated. Will check with the provider and give her a call back. Pt voiced understanding.

## 2016-11-27 ENCOUNTER — Telehealth (HOSPITAL_COMMUNITY): Payer: Self-pay | Admitting: Internal Medicine

## 2016-11-27 NOTE — Telephone Encounter (Signed)
Spoke with patient and informed her that, per NP, she may come to the patient care center for evaluation; pt verbalizes understanding

## 2016-11-30 ENCOUNTER — Encounter (HOSPITAL_COMMUNITY): Payer: Self-pay | Admitting: *Deleted

## 2016-11-30 ENCOUNTER — Telehealth (HOSPITAL_COMMUNITY): Payer: Self-pay | Admitting: *Deleted

## 2016-11-30 ENCOUNTER — Non-Acute Institutional Stay (HOSPITAL_COMMUNITY)
Admission: AD | Admit: 2016-11-30 | Discharge: 2016-11-30 | Disposition: A | Discharge status: Home / Self Care | Payer: Medicaid Other | Source: Ambulatory Visit | Attending: Internal Medicine | Admitting: Internal Medicine

## 2016-11-30 DIAGNOSIS — D649 Anemia, unspecified: Secondary | ICD-10-CM | POA: Diagnosis not present

## 2016-11-30 DIAGNOSIS — Z79899 Other long term (current) drug therapy: Secondary | ICD-10-CM | POA: Insufficient documentation

## 2016-11-30 DIAGNOSIS — D5700 Hb-SS disease with crisis, unspecified: Secondary | ICD-10-CM | POA: Diagnosis not present

## 2016-11-30 DIAGNOSIS — F329 Major depressive disorder, single episode, unspecified: Secondary | ICD-10-CM | POA: Diagnosis not present

## 2016-11-30 DIAGNOSIS — F17200 Nicotine dependence, unspecified, uncomplicated: Secondary | ICD-10-CM | POA: Diagnosis not present

## 2016-11-30 LAB — CBC WITH DIFFERENTIAL/PLATELET
BASOS ABS: 0 10*3/uL (ref 0.0–0.1)
BASOS PCT: 0 %
Band Neutrophils: 0 %
Blasts: 0 %
EOS ABS: 0 10*3/uL (ref 0.0–0.7)
EOS PCT: 0 %
HCT: 26.5 % — ABNORMAL LOW (ref 36.0–46.0)
HEMOGLOBIN: 9.3 g/dL — AB (ref 12.0–15.0)
LYMPHS PCT: 42 %
Lymphs Abs: 3 10*3/uL (ref 0.7–4.0)
MCH: 40.1 pg — ABNORMAL HIGH (ref 26.0–34.0)
MCHC: 35.1 g/dL (ref 30.0–36.0)
MCV: 114.2 fL — ABNORMAL HIGH (ref 78.0–100.0)
MONO ABS: 0.4 10*3/uL (ref 0.1–1.0)
MONOS PCT: 5 %
MYELOCYTES: 0 %
Metamyelocytes Relative: 0 %
NEUTROS PCT: 53 %
NRBC: 5 /100{WBCs} — AB
Neutro Abs: 3.8 10*3/uL (ref 1.7–7.7)
Platelets: 328 10*3/uL (ref 150–400)
Promyelocytes Absolute: 0 %
RBC: 2.32 MIL/uL — ABNORMAL LOW (ref 3.87–5.11)
RDW: 18.8 % — ABNORMAL HIGH (ref 11.5–15.5)
WBC: 7.2 10*3/uL (ref 4.0–10.5)

## 2016-11-30 LAB — COMPREHENSIVE METABOLIC PANEL
ALT: 29 U/L (ref 14–54)
AST: 48 U/L — AB (ref 15–41)
Albumin: 4.4 g/dL (ref 3.5–5.0)
Alkaline Phosphatase: 110 U/L (ref 38–126)
Anion gap: 8 (ref 5–15)
BUN: 7 mg/dL (ref 6–20)
CHLORIDE: 107 mmol/L (ref 101–111)
CO2: 23 mmol/L (ref 22–32)
CREATININE: 0.53 mg/dL (ref 0.44–1.00)
Calcium: 9.2 mg/dL (ref 8.9–10.3)
GFR calc non Af Amer: >60 mL/min (ref >60)
Glucose, Bld: 132 mg/dL — ABNORMAL HIGH (ref 65–99)
Potassium: 3.5 mmol/L (ref 3.5–5.1)
SODIUM: 138 mmol/L (ref 135–145)
Total Bilirubin: 2.1 mg/dL — ABNORMAL HIGH (ref 0.3–1.2)
Total Protein: 8.1 g/dL (ref 6.5–8.1)

## 2016-11-30 MED ORDER — PROMETHAZINE HCL 25 MG PO TABS
25.0000 mg | ORAL_TABLET | Freq: Once | ORAL | Status: AC
  Administered 2016-11-30: 25 mg via ORAL
  Filled 2016-11-30: qty 1

## 2016-11-30 MED ORDER — DEXTROSE-NACL 5-0.45 % IV SOLN
INTRAVENOUS | Status: DC
  Administered 2016-11-30: 11:00:00 via INTRAVENOUS

## 2016-11-30 MED ORDER — HEPARIN SOD (PORK) LOCK FLUSH 100 UNIT/ML IV SOLN: 250.0000 [IU] | INTRAVENOUS | Status: DC | PRN

## 2016-11-30 MED ORDER — SODIUM CHLORIDE 0.9% FLUSH: 10.0000 mL | INTRAVENOUS | Status: DC | PRN

## 2016-11-30 MED ORDER — HEPARIN SOD (PORK) LOCK FLUSH 100 UNIT/ML IV SOLN
250.0000 [IU] | INTRAVENOUS | Status: AC | PRN
  Administered 2016-11-30: 250 [IU]
  Filled 2016-11-30: qty 5

## 2016-11-30 MED ORDER — DIPHENHYDRAMINE HCL 25 MG PO CAPS
25.0000 mg | ORAL_CAPSULE | ORAL | Status: DC | PRN
  Administered 2016-11-30: 50 mg via ORAL
  Filled 2016-11-30: qty 2

## 2016-11-30 MED ORDER — SODIUM CHLORIDE 0.9% FLUSH: 9.0000 mL | INTRAVENOUS | Status: DC | PRN

## 2016-11-30 MED ORDER — SODIUM CHLORIDE 0.9 % IV SOLN
25.0000 mg | INTRAVENOUS | Status: DC | PRN
  Filled 2016-11-30: qty 0.5

## 2016-11-30 MED ORDER — SODIUM CHLORIDE 0.9% FLUSH
10.0000 mL | INTRAVENOUS | Status: AC | PRN
  Administered 2016-11-30: 10 mL

## 2016-11-30 MED ORDER — NALOXONE HCL 0.4 MG/ML IJ SOLN: 0.4000 mg | INTRAMUSCULAR | Status: DC | PRN

## 2016-11-30 MED ORDER — HYDROMORPHONE 1 MG/ML IV SOLN
INTRAVENOUS | Status: DC
  Administered 2016-11-30: 12:00:00 via INTRAVENOUS
  Administered 2016-11-30: 14 mg via INTRAVENOUS
  Filled 2016-11-30: qty 25

## 2016-11-30 NOTE — Progress Notes (Addendum)
Patient admitted to the [facility]. Upon admission patient reported generalized pain and rated it 7/10 on pain scale. Patient was placed on a Dilaudid PCA and given IV fluids. At time of discharge patient's pain level was still a 7/10 on pain scale. Discharge instructions given to patient and patient states an understanding. Patient alert, oriented, and ambulatory at time of discharge.

## 2016-11-30 NOTE — Discharge Summary (Signed)
[facility] Discharge Summary   Patient ID: Brittany Foster MRN: [id number] DOB/AGE: February 16, 1986 31 y.o.  Admit date: 11/30/2016 Discharge date: 11/30/2016  Primary Care Physician:  Bing Neighbors, FNP  Admission Diagnoses:  Active Problems:   Sickle cell pain crisis [facility])  Discharge Medications:  Allergies as of 11/30/2016      Reactions   Ultram [tramadol] Other (See Comments)   Reaction:  Seizures    Zofran [ondansetron Hcl] Nausea And Vomiting   Buprenorphine Hcl Hives, Other (See Comments)   Reaction:  Shaking    Fentanyl Hives   Morphine And Related Hives, Other (See Comments)   Reaction:  Shaking    Tape Rash      Medication List    TAKE these medications   ARIPiprazole 10 MG tablet Commonly known as:  ABILIFY Take 10 mg by mouth daily   DULoxetine 60 MG capsule Commonly known as:  CYMBALTA TAKE 1 CAPSULE BY MOUTH DAILY.   folic acid 1 MG tablet Commonly known as:  FOLVITE Take 1 tablet (1 mg total) by mouth daily.   hydroxyurea 500 MG capsule Commonly known as:  HYDREA TAKE 2 CAPSULES BY MOUTH DAILY. MAY TAKE WITH FOOD TO MINIMIZE GI SIDE EFFECTS. What changed:  how much to take  how to take this  when to take this  additional instructions   ibuprofen 200 MG tablet Commonly known as:  ADVIL,MOTRIN Take 400 mg by mouth every 6 (six) hours as needed.   medroxyPROGESTERone 150 MG/ML injection Commonly known as:  DEPO-PROVERA Inject 150 mg into the muscle every 3 (three) months.   Topiramate ER 100 MG Cp24 Commonly known as:  TROKENDI XR Take 200 mg by mouth at bedtime. What changed:  how much to take        Consults:  None  Significant Diagnostic Studies:  Dg Chest 2 View  Result Date: 11/14/2016 CLINICAL DATA:  Left-sided rib and chest pain. History of sickle cell. EXAM: CHEST  2 VIEW COMPARISON:  08/22/2016 FINDINGS: The heart size and mediastinal contours are within normal limits. Right-sided catheter tip is seen in  the mid SVC as before. Both lungs are clear. The visualized skeletal structures are unremarkable. IMPRESSION: No active cardiopulmonary disease. Electronically Signed   By: Tollie Eth M.D.   On: 11/14/2016 20:47     [facility] Course: Brittany Foster, a 31 year old female with a history of sickle cell anemia, HbSS presents complaining of generalized pain. She says that pain has been increased over the past 3 days.  Pain intensity 8/10 described as constant and throbbing.  Patient admitted to the day infusion center for extended observation Reviewed laboratory values, consistent with baseline. Brittany Foster is hemodynamically stable.   Pain management: Hypotonic IV fluid for cellular rehydration Started Dilaudid PCA per weight based protocol. Jazlen used a total of 14 mg with 27 demands and 26 deliveries.  Pain intensity decrease from 8/10 to 5-6/10. Patient stable and will discharge with family in stable condition  Discharge instructions: Resume all home medications Follow up in primary care with Joaquin Courts, FNP as scheduled  Discussed the importance of drinking 64 ounces of water daily. The Importance of Water. To help prevent pain crises, it is important to drink plenty of water throughout the day. This is because dehydration of red blood cells may lead to the sickling process.     The patient was given clear instructions to go to ER or return to medical center  if symptoms do not improve, worsen or new problems develop. The patient verbalized understanding.     Physical Exam at Discharge:  BP (!) 97/58 (BP Location: Right Arm)   Pulse 80   Temp 98.6 F (37 C) (Oral)   Resp 12   Ht 5' (1.524 m)   Wt 132 lb (59.9 kg)   SpO2 99%   BMI 25.78 kg/m    General Appearance:    Alert, cooperative, no distress, appears stated age  Head:    Normocephalic, without obvious abnormality, atraumatic  Eyes:    PERRL, conjunctiva/corneas clear, EOM's intact, fundi     benign, both eyes  Neck:   Supple, symmetrical, trachea midline, no adenopathy;    thyroid:  no enlargement/tenderness/nodules; no carotid   bruit or JVD  Back:     Symmetric, no curvature, ROM normal, no CVA tenderness  Lungs:     Clear to auscultation bilaterally, respirations unlabored  Chest Wall:    No tenderness or deformity   Heart:    Regular rate and rhythm, S1 and S2 normal, no murmur, rub   or gallop  Neurologic:   CNII-XII intact, normal strength, sensation and reflexes    throughout    Disposition at Discharge: 01-Home or Self Care  Discharge Orders: Discharge Instructions    Discharge patient    Complete by:  As directed    Discharge disposition:  01-Home or Self Care   Discharge patient date:  11/30/2016      Condition at Discharge:   Stable  Time spent on Discharge:  Greater than 30 minutes.  Signed: Caitrin Pendergraph M 11/30/2016, 4:08 PM

## 2016-11-30 NOTE — Discharge Instructions (Signed)
Resume all home medications ° °Discussed the importance of drinking 64 ounces of water daily. The Importance of Water. To help prevent pain crises, it is important to drink plenty of water throughout the day. This is because dehydration of red blood cells may lead to the sickling process.  ° °Sickle Cell Anemia, Adult °Sickle cell anemia is a condition where your red blood cells are shaped like sickles. Red blood cells carry oxygen through the body. Sickle-shaped red blood cells do not live as long as normal red blood cells. They also clump together and block blood from flowing through the blood vessels. These things prevent the body from getting enough oxygen. Sickle cell anemia causes organ damage and pain. It also increases the risk of infection. °Follow these instructions at home: °· Drink enough fluid to keep your pee (urine) clear or pale yellow. Drink more in hot weather and during exercise. °· Do not smoke. Smoking lowers oxygen levels in the blood. °· Only take over-the-counter or prescription medicines as told by your doctor. °· Take antibiotic medicines as told by your doctor. Make sure you finish them even if you start to feel better. °· Take supplements as told by your doctor. °· Consider wearing a medical alert bracelet. This tells anyone caring for you in an emergency of your condition. °· When traveling, keep your medical information, doctors' names, and the medicines you take with you at all times. °· If you have a fever, do not take fever medicines right away. This could cover up a problem. Tell your doctor. °· Keep all follow-up visits with your doctor. Sickle cell anemia requires regular medical care. °Contact a doctor if: °You have a fever. °Get help right away if: °· You feel dizzy or faint. °· You have new belly (abdominal) pain, especially on the left side near the stomach area. °· You have a lasting, often uncomfortable and painful erection of the penis (priapism). If it is not treated right  away, you will become unable to have sex (impotence). °· You have numbness in your arms or legs or you have a hard time moving them. °· You have a hard time talking. °· You have a fever or lasting symptoms for more than 2-3 days. °· You have a fever and your symptoms suddenly get worse. °· You have signs or symptoms of infection. These include: °? Chills. °? Being more tired than normal (lethargy). °? Irritability. °? Poor eating. °? Throwing up (vomiting). °· You have pain that is not helped with medicine. °· You have shortness of breath. °· You have pain in your chest. °· You are coughing up pus-like or bloody mucus. °· You have a stiff neck. °· Your feet or hands swell or have pain. °· Your belly looks bloated. °· Your joints hurt. °This information is not intended to replace advice given to you by your health care provider. Make sure you discuss any questions you have with your health care provider. °Document Released: 12/07/2012 Document Revised: 07/25/2015 Document Reviewed: 09/28/2012 °Elsevier Interactive Patient Education © 2017 [employer] ° ° °

## 2016-11-30 NOTE — Telephone Encounter (Signed)
Patient notified to come to Day hospital for treatment. Patient acknowledged and verbalized she will arrive here shortly.

## 2016-11-30 NOTE — Telephone Encounter (Signed)
Patient called requesting treatment at the [facility] c/o bilateral leg, hip and back pain. Patient rates pain a 7/10 on pain scale. Patient denies fever, chest pain, diarrhea, or abdominal pain. Patient reports being nauseous but has not vomited. Patient reports she last took her home medication Oxycodone and MS Cotin around 6:30 am with no relief. Patient told to expect a return phone call once provider is notified.

## 2016-11-30 NOTE — H&P (Signed)
[facility] History and Physical   Date: 11/30/2016  Patient name: Brittany Foster Medical record number: [id number] Date of birth: 1985-08-05 Age: 31 y.o. Gender: female PCP: Bing Neighbors, FNP  Attending physician: Quentin Angst, MD  Chief Complaint: Sickle cell crisis  History of Present Illness: Saul Brecht,a  31 year old female with a history of sickle cell anemia, HbSS presents complaining of generalized pain that is consistent with sickle cell anemia. Pain is primarily to lower extremities and back. Current pain intensity is 8/10 described as constant and throbbing. She says that she has been taking MS Contin and Percocet 10-325 mg without sustained relief. She last had medications this am. She endorses nausea. She denies headache, chest pain, shortness of breath, dysuria, constipation or diarrhea.  Meds: Prescriptions Prior to Admission  Medication Sig Dispense Refill Last Dose  . ARIPiprazole (ABILIFY) 10 MG tablet Take 10 mg by mouth daily  3 11/27/2016 at Unknown time  . ibuprofen (ADVIL,MOTRIN) 200 MG tablet Take 400 mg by mouth every 6 (six) hours as needed.   Past Month at Unknown time  . DULoxetine (CYMBALTA) 60 MG capsule TAKE 1 CAPSULE BY MOUTH DAILY. 30 capsule 3 11/27/2016  . folic acid (FOLVITE) 1 MG tablet Take 1 tablet (1 mg total) by mouth daily. 90 tablet 3 11/27/2016  . hydroxyurea (HYDREA) 500 MG capsule TAKE 2 CAPSULES BY MOUTH DAILY. MAY TAKE WITH FOOD TO MINIMIZE GI SIDE EFFECTS. (Patient taking differently: Take 1,500 mg by mouth daily. ) 180 capsule 3 11/27/2016  . medroxyPROGESTERone (DEPO-PROVERA) 150 MG/ML injection Inject 150 mg into the muscle every 3 (three) months.    More than a month at Unknown time  . Topiramate ER 100 MG CP24 Take 200 mg by mouth at bedtime. (Patient taking differently: Take 100 mg by mouth at bedtime. ) 60 capsule 3 11/27/2016    Allergies: Ultram [tramadol]; Zofran [ondansetron hcl]; Buprenorphine hcl;  Fentanyl; Morphine and related; and Tape Past Medical History:  Diagnosis Date  . Anemia   . Depression, major, recurrent (HCC)   . Migraines   . Sickle cell anemia (HCC)    Past Surgical History:  Procedure Laterality Date  . CESAREAN SECTION    . CHOLECYSTECTOMY  2000  . IR GENERIC HISTORICAL  10/08/2015   IR US GUIDE VASC ACCESS RIGHT 10/08/2015 Simonne Come, MD [facility]  . IR GENERIC HISTORICAL  10/08/2015   IR FLUORO GUIDE CV LINE RIGHT 10/08/2015 Simonne Come, MD [facility]  . MULTIPLE TOOTH EXTRACTIONS N/A   . port a cath placement Right    about 6-7 years ago  . removal of porta cath Right 09/11/15  . TUBAL LIGATION     Family History  Problem Relation Age of Onset  . Sickle cell trait Father   . Sickle cell trait Mother   . Sickle cell anemia Other    Social History   Social History  . Marital status: Single    Spouse name: N/A  . Number of children: N/A  . Years of education: N/A   Occupational History  . None    Social History Main Topics  . Smoking status: Current Some Day Smoker    Packs/day: 1.00  . Smokeless tobacco: Current User  . Alcohol use No  . Drug use: No  . Sexual activity: Yes    Partners: Male    Birth control/ protection: Injection     Comment: 1 new partner recently, no concern about STI, uses  condoms   Other Topics Concern  . Not on file   Social History Narrative   20 year old daughter    Review of Systems: Review of Systems  Constitutional: Negative.   HENT: Negative.   Eyes: Negative.   Respiratory: Negative.   Cardiovascular: Negative.   Gastrointestinal: Positive for nausea.  Genitourinary: Negative.   Musculoskeletal: Positive for back pain and joint pain.  Skin: Negative.   Neurological: Negative.   Psychiatric/Behavioral: Negative.     Physical Exam: Blood pressure 119/69, pulse 96, temperature 98.7 F (37.1 C), temperature source Oral, resp. rate 18, height 5' (1.524 m), weight 132 lb (59.9 kg), SpO2 100  %. BP 119/69 (BP Location: Left Arm, Patient Position: Sitting, Cuff Size: Normal)   Pulse 96   Temp 98.7 F (37.1 C) (Oral)   Resp 18   Ht 5' (1.524 m)   Wt 132 lb (59.9 kg)   SpO2 100%   BMI 25.78 kg/m   General Appearance:    Alert, cooperative, no distress, appears stated age  Head:    Normocephalic, without obvious abnormality, atraumatic  Eyes:    PERRL, conjunctiva/corneas clear, EOM's intact, fundi    benign, both eyes  Ears:    Normal TM's and external ear canals, both ears  Nose:   Nares normal, septum midline, mucosa normal, no drainage    or sinus tenderness  Throat:   Lips, mucosa, and tongue normal; teeth and gums normal  Neck:   Supple, symmetrical, trachea midline, no adenopathy;    thyroid:  no enlargement/tenderness/nodules; no carotid   bruit or JVD  Back:     Symmetric, no curvature, ROM normal, no CVA tenderness  Lungs:     Clear to auscultation bilaterally, respirations unlabored  Chest Wall:    No tenderness or deformity   Heart:    Regular rate and rhythm, S1 and S2 normal, no murmur, rub   or gallop  Abdomen:     Soft, non-tender, bowel sounds active all four quadrants,    no masses, no organomegaly  Extremities:   Extremities normal, atraumatic, no cyanosis or edema  Pulses:   2+ and symmetric all extremities  Skin:   Skin color, texture, turgor normal, no rashes or lesions  Lymph nodes:   Cervical, supraclavicular, and axillary nodes normal  Neurologic:   CNII-XII intact, normal strength, sensation and reflexes    throughout    Lab results: No results found for this or any previous visit (from the past 24 hour(s)).  Imaging results:  No results found.   Assessment & Plan:  Patient will be admitted to the day infusion center for extended observation  Start IV D5.45 for cellular rehydration at 75/hr  Start Dilaudid PCA High Concentration per weight based protocol. Loading dose of 1 mg   Promethazine 25 mg po times 1 for nausea   Patient  will be re-evaluated for pain intensity in the context of function and relationship to baseline as care       progresses.  If no significant pain relief, will transfer patient to inpatient services for a higher level of care.   Will check CMP,  Reticulocytes and CBC w/differential  Epifania Littrell M 11/30/2016, 11:11 AM

## 2016-12-02 ENCOUNTER — Encounter: Payer: Self-pay | Admitting: Family Medicine

## 2016-12-02 NOTE — Progress Notes (Signed)
Brittany Foster, a 31 year old female with a history of sickle cell anemia called requesting opiate medications from clinic. A referral was sent to pain management previously. Patient has not followed up with pain management consistently. Patient will not be able to obtain prescriptions for opiate medications from this clinic per medical director.   Notified Brittany Foster, [facility] in order to arrange transportation to pain clinic. Patient will follow up with Ms. Collins pertaining to drug rehabilitation services.    Nolon Nations  MSN, FNP-C Patient Care [facility] [address] [place], [place] [address] [phone]

## 2016-12-03 ENCOUNTER — Telehealth (HOSPITAL_COMMUNITY): Payer: Self-pay | Admitting: *Deleted

## 2016-12-03 NOTE — Telephone Encounter (Signed)
[facility] for [facility] at day hospital. Patient c/o a 8 out of 10 back and hip pain on a 1-10 pain scale relating to [facility]. Patient c/o nausea, denies fevers, chest pain, vomiting, diarrhea, and abdominal pain. Patient verbalized she last took her MsContin  @ 0500 and oxycodone  @ 0730.   Notified provider. Patient educated that to reassess pain level after one of taking medication and educated to continue to take her medication as prescribed around the clock and stay hydrated. Patient encouraged if in any distress or experiencing chest pain to seek medication attention. Patient acknowledged and verbalized understanding.

## 2016-12-08 ENCOUNTER — Telehealth (HOSPITAL_COMMUNITY): Payer: Self-pay | Admitting: *Deleted

## 2016-12-08 NOTE — Telephone Encounter (Signed)
[facility] for [facility] at day hospital. Patient c/o a 8 out of 10 hips, lower back, and bilateral leg pain on a 1-10 pain scale relating to [facility]. Patient denies fevers, chest pain, nausea, vomiting, diarrhea, and abdominal pain. Patient verbalized she last took her Oxycodone  @ 0600 and ran out of her MsContin prescription at this time. Patient stated she discharged couple days ago and unable to find a physician to refill her prescription at this time.   Notified Provider. At this time patient is educated to continue hydration, take her medications around the clock, and to call back for triage tomorrow. Patient made aware if worsening symptoms experiencing distress or chest pain to seek medical attention. Patient verbalized understanding and requested to speak to a provider verbally. Placed patient on hold to see if provider is available and patient hung up the phone.

## 2016-12-09 ENCOUNTER — Non-Acute Institutional Stay (HOSPITAL_COMMUNITY): Admission: AD | Admit: 2016-12-09 | Payer: Medicaid Other | Source: Ambulatory Visit | Admitting: Internal Medicine

## 2016-12-09 ENCOUNTER — Telehealth (HOSPITAL_COMMUNITY): Payer: Self-pay | Admitting: *Deleted

## 2016-12-09 NOTE — Telephone Encounter (Signed)
Patient called c/o bilateral leg, hip and back pain. Patient rates pain a 8/10 on pain scale. Patient denies, fever, chest pain, vomiting or diarrhea, or abdominal pain but, states she is nauseous. Patient reports last taking her home medication Oxycodone 10 mg around 7 am with no relief. Patient told to expect a return phone call once provider is notified. Patient states an understanding.

## 2016-12-09 NOTE — Telephone Encounter (Signed)
Patient called back at this time and advised to come to the [facility] for treatment per provider Jerrilyn Cairo. Patient states an understanding and she is trying to find a ride.

## 2016-12-10 ENCOUNTER — Encounter (HOSPITAL_COMMUNITY): Payer: Self-pay | Admitting: *Deleted

## 2016-12-10 ENCOUNTER — Telehealth (HOSPITAL_COMMUNITY): Payer: Self-pay | Admitting: *Deleted

## 2016-12-10 ENCOUNTER — Non-Acute Institutional Stay (HOSPITAL_COMMUNITY)
Admission: AD | Admit: 2016-12-10 | Discharge: 2016-12-10 | Disposition: A | Discharge status: Home / Self Care | Payer: Medicaid Other | Source: Ambulatory Visit | Attending: Internal Medicine | Admitting: Internal Medicine

## 2016-12-10 DIAGNOSIS — F17210 Nicotine dependence, cigarettes, uncomplicated: Secondary | ICD-10-CM | POA: Diagnosis not present

## 2016-12-10 DIAGNOSIS — E876 Hypokalemia: Secondary | ICD-10-CM | POA: Diagnosis not present

## 2016-12-10 DIAGNOSIS — D5700 Hb-SS disease with crisis, unspecified: Secondary | ICD-10-CM | POA: Diagnosis present

## 2016-12-10 DIAGNOSIS — Z79899 Other long term (current) drug therapy: Secondary | ICD-10-CM | POA: Insufficient documentation

## 2016-12-10 LAB — COMPREHENSIVE METABOLIC PANEL
ALBUMIN: 4.2 g/dL (ref 3.5–5.0)
ALT: 34 U/L (ref 14–54)
ANION GAP: 8 (ref 5–15)
AST: 45 U/L — AB (ref 15–41)
Alkaline Phosphatase: 109 U/L (ref 38–126)
BILIRUBIN TOTAL: 2.3 mg/dL — AB (ref 0.3–1.2)
BUN: 5 mg/dL — AB (ref 6–20)
CHLORIDE: 108 mmol/L (ref 101–111)
CO2: 24 mmol/L (ref 22–32)
Calcium: 8.8 mg/dL — ABNORMAL LOW (ref 8.9–10.3)
Creatinine, Ser: 0.4 mg/dL — ABNORMAL LOW (ref 0.44–1.00)
GFR calc Af Amer: >60 mL/min (ref >60)
GFR calc non Af Amer: >60 mL/min (ref >60)
GLUCOSE: 145 mg/dL — AB (ref 65–99)
POTASSIUM: 2.6 mmol/L — AB (ref 3.5–5.1)
SODIUM: 140 mmol/L (ref 135–145)
TOTAL PROTEIN: 7.5 g/dL (ref 6.5–8.1)

## 2016-12-10 LAB — CBC WITH DIFFERENTIAL/PLATELET
BASOS ABS: 0 10*3/uL (ref 0.0–0.1)
Basophils Relative: 0 %
EOS ABS: 0 10*3/uL (ref 0.0–0.7)
EOS PCT: 0 %
HCT: 24 % — ABNORMAL LOW (ref 36.0–46.0)
Hemoglobin: 8.5 g/dL — ABNORMAL LOW (ref 12.0–15.0)
LYMPHS ABS: 2.8 10*3/uL (ref 0.7–4.0)
LYMPHS PCT: 37 %
MCH: 40.7 pg — ABNORMAL HIGH (ref 26.0–34.0)
MCHC: 35.4 g/dL (ref 30.0–36.0)
MCV: 114.8 fL — ABNORMAL HIGH (ref 78.0–100.0)
MONO ABS: 0.7 10*3/uL (ref 0.1–1.0)
Monocytes Relative: 9 %
NEUTROS ABS: 4.1 10*3/uL (ref 1.7–7.7)
NEUTROS PCT: 54 %
PLATELETS: 199 10*3/uL (ref 150–400)
RBC: 2.09 MIL/uL — AB (ref 3.87–5.11)
RDW: 19.4 % — AB (ref 11.5–15.5)
WBC: 7.6 10*3/uL (ref 4.0–10.5)
nRBC: 5 /100 WBC — ABNORMAL HIGH

## 2016-12-10 LAB — POTASSIUM: POTASSIUM: 3.1 mmol/L — AB (ref 3.5–5.1)

## 2016-12-10 MED ORDER — SODIUM CHLORIDE 0.9 % IV SOLN
25.0000 mg | INTRAVENOUS | Status: DC | PRN
  Filled 2016-12-10: qty 0.5

## 2016-12-10 MED ORDER — HYDROMORPHONE 1 MG/ML IV SOLN
INTRAVENOUS | Status: DC
  Administered 2016-12-10: 25 mg via INTRAVENOUS
  Filled 2016-12-10: qty 25

## 2016-12-10 MED ORDER — OXYCODONE HCL 5 MG PO TABS: 10.0000 mg | ORAL_TABLET | Freq: Once | ORAL | Status: DC

## 2016-12-10 MED ORDER — DEXTROSE-NACL 5-0.45 % IV SOLN
INTRAVENOUS | Status: DC
  Administered 2016-12-10: 12:00:00 via INTRAVENOUS

## 2016-12-10 MED ORDER — PROMETHAZINE HCL 25 MG PO TABS
12.5000 mg | ORAL_TABLET | Freq: Once | ORAL | Status: AC
  Administered 2016-12-10: 12.5 mg via ORAL
  Filled 2016-12-10: qty 1

## 2016-12-10 MED ORDER — KCL IN DEXTROSE-NACL 20-5-0.45 MEQ/L-%-% IV SOLN
INTRAVENOUS | Status: DC
  Administered 2016-12-10: 14:00:00 via INTRAVENOUS
  Filled 2016-12-10: qty 1000

## 2016-12-10 MED ORDER — SODIUM CHLORIDE 0.9% FLUSH: 10.0000 mL | INTRAVENOUS | Status: DC | PRN

## 2016-12-10 MED ORDER — ONDANSETRON HCL 4 MG/2ML IJ SOLN: 4.0000 mg | Freq: Four times a day (QID) | INTRAMUSCULAR | Status: DC | PRN

## 2016-12-10 MED ORDER — SODIUM CHLORIDE 0.9% FLUSH: 9.0000 mL | INTRAVENOUS | Status: DC | PRN

## 2016-12-10 MED ORDER — KETOROLAC TROMETHAMINE 30 MG/ML IJ SOLN
30.0000 mg | Freq: Once | INTRAMUSCULAR | Status: AC
  Administered 2016-12-10: 30 mg via INTRAVENOUS
  Filled 2016-12-10: qty 1

## 2016-12-10 MED ORDER — POTASSIUM CHLORIDE CRYS ER 20 MEQ PO TBCR
40.0000 meq | EXTENDED_RELEASE_TABLET | Freq: Two times a day (BID) | ORAL | Status: DC
  Administered 2016-12-10: 40 meq via ORAL
  Filled 2016-12-10: qty 2

## 2016-12-10 MED ORDER — HEPARIN SOD (PORK) LOCK FLUSH 100 UNIT/ML IV SOLN
250.0000 [IU] | INTRAVENOUS | Status: AC | PRN
  Administered 2016-12-10: 250 [IU]

## 2016-12-10 MED ORDER — NALOXONE HCL 0.4 MG/ML IJ SOLN: 0.4000 mg | INTRAMUSCULAR | Status: DC | PRN

## 2016-12-10 MED ORDER — DIPHENHYDRAMINE HCL 25 MG PO CAPS
25.0000 mg | ORAL_CAPSULE | ORAL | Status: DC | PRN
  Administered 2016-12-10: 50 mg via ORAL
  Filled 2016-12-10: qty 2

## 2016-12-10 NOTE — Progress Notes (Signed)
CRITICAL VALUE ALERT  Critical Value:  Potassium 2.6   Date & Time Notied:  10/11 1300  Provider Notified: Julianne Handler FNP-C  Orders Received/Actions taken: Orders in chart

## 2016-12-10 NOTE — Progress Notes (Signed)
[facility]. Upon admission patient reported generalized pain and rated it 9/10 on pain scale. Patient was placed on a Dilaudid PCA and given IV fluids. At time of discharge patient's pain level was still a 7/10 on pain scale. Discharge instructions given to patient and patient states an understanding. Patient alert, oriented, and ambulatory at time of discharge.

## 2016-12-10 NOTE — Telephone Encounter (Signed)
[facility] c/o bilateral hip, leg and back pain. Patient rates pain a 8/10 on pain scale. Patient denies fever, chest pain, vomiting, diarrhea, or abdominal pain. Patient reports being nauseous and she last took her home medication Oxycodone 10 mg at 7 am with no relief.   Patient placed on a brief hold and provider notified. Patient advised to come to the [facility] Hospital for treatment per provider Julianne Handler FNP. Patient states an understanding and she trying to find a ride.

## 2016-12-10 NOTE — Discharge Instructions (Signed)
Follow up in 1 week for labs.  Resume all home medications  Discussed the importance of drinking 64 ounces of water daily. The Importance of Water. To help prevent pain crises, it is important to drink plenty of water throughout the day. This is because dehydration of red blood cells may lead to the sickling process.     The patient was given clear instructions to go to ER or return to medical center if symptoms do not improve, worsen or new problems develop. The patient verbalized understanding.     Sickle Cell Anemia, Adult Sickle cell anemia is a condition where your red blood cells are shaped like sickles. Red blood cells carry oxygen through the body. Sickle-shaped red blood cells do not live as long as normal red blood cells. They also clump together and block blood from flowing through the blood vessels. These things prevent the body from getting enough oxygen. Sickle cell anemia causes organ damage and pain. It also increases the risk of infection. Follow these instructions at home:  Drink enough fluid to keep your pee (urine) clear or pale yellow. Drink more in hot weather and during exercise.  Do not smoke. Smoking lowers oxygen levels in the blood.  Only take over-the-counter or prescription medicines as told by your doctor.  Take antibiotic medicines as told by your doctor. Make sure you finish them even if you start to feel better.  Take supplements as told by your doctor.  Consider wearing a medical alert bracelet. This tells anyone caring for you in an emergency of your condition.  When traveling, keep your medical information, doctors' names, and the medicines you take with you at all times.  If you have a fever, do not take fever medicines right away. This could cover up a problem. Tell your doctor.  Keep all follow-up visits with your doctor. Sickle cell anemia requires regular medical care. Contact a doctor if: You have a fever. Get help right away if:  You feel  dizzy or faint.  You have new belly (abdominal) pain, especially on the left side near the stomach area.  You have a lasting, often uncomfortable and painful erection of the penis (priapism). If it is not treated right away, you will become unable to have sex (impotence).  You have numbness in your arms or legs or you have a hard time moving them.  You have a hard time talking.  You have a fever or lasting symptoms for more than 2-3 days.  You have a fever and your symptoms suddenly get worse.  You have signs or symptoms of infection. These include: ? Chills. ? Being more tired than normal (lethargy). ? Irritability. ? Poor eating. ? Throwing up (vomiting).  You have pain that is not helped with medicine.  You have shortness of breath.  You have pain in your chest.  You are coughing up pus-like or bloody mucus.  You have a stiff neck.  Your feet or hands swell or have pain.  Your belly looks bloated.  Your joints hurt. This information is not intended to replace advice given to you by your health care provider. Make sure you discuss any questions you have with your health care provider. Document Released: 12/07/2012 Document Revised: 07/25/2015 Document Reviewed: 09/28/2012 Elsevier Interactive Patient Education  2017 [employer].

## 2016-12-10 NOTE — Discharge Summary (Signed)
[facility] Discharge Summary   Patient ID: Brittany Foster MRN: [id number] DOB/AGE: 1986-02-13 31 y.o.  Admit date: 12/10/2016 Discharge date: 12/10/2016  Primary Care Physician:  Bing Neighbors, FNP  Admission Diagnoses:  Active Problems:   Hypokalemia   Hb-SS disease with crisis [facility])  Discharge Medications:  Allergies as of 12/10/2016      Reactions   Ultram [tramadol] Other (See Comments)   Reaction:  Seizures    Zofran [ondansetron Hcl] Nausea And Vomiting   Buprenorphine Hcl Hives, Other (See Comments)   Reaction:  Shaking    Fentanyl Hives   Morphine And Related Hives, Other (See Comments)   Reaction:  Shaking    Tape Rash      Medication List    TAKE these medications   ARIPiprazole 10 MG tablet Commonly known as:  ABILIFY Take 10 mg by mouth daily   DULoxetine 60 MG capsule Commonly known as:  CYMBALTA TAKE 1 CAPSULE BY MOUTH DAILY.   folic acid 1 MG tablet Commonly known as:  FOLVITE Take 1 tablet (1 mg total) by mouth daily.   hydroxyurea 500 MG capsule Commonly known as:  HYDREA TAKE 2 CAPSULES BY MOUTH DAILY. MAY TAKE WITH FOOD TO MINIMIZE GI SIDE EFFECTS. What changed:  how much to take  how to take this  when to take this  additional instructions   ibuprofen 200 MG tablet Commonly known as:  ADVIL,MOTRIN Take 400 mg by mouth every 6 (six) hours as needed.   medroxyPROGESTERone 150 MG/ML injection Commonly known as:  DEPO-PROVERA Inject 150 mg into the muscle every 3 (three) months.   Topiramate ER 100 MG Cp24 Commonly known as:  TROKENDI XR Take 200 mg by mouth at bedtime. What changed:  how much to take        Consults:  None  Significant Diagnostic Studies:  Dg Chest 2 View  Result Date: 11/14/2016 CLINICAL DATA:  Left-sided rib and chest pain. History of sickle cell. EXAM: CHEST  2 VIEW COMPARISON:  08/22/2016 FINDINGS: The heart size and mediastinal contours are within normal limits. Right-sided  catheter tip is seen in the mid SVC as before. Both lungs are clear. The visualized skeletal structures are unremarkable. IMPRESSION: No active cardiopulmonary disease. Electronically Signed   By: Tollie Eth M.D.   On: 11/14/2016 20:47     [facility] Course: Brittany Foster, a 31 year old female with a history of sickle cell anemia, HbSS presents complaining of generalized pain. She says that pain has been increased over the past 3 days.  Pain intensity 8/10 described as constant and throbbing.  Patient admitted to the day infusion center for extended observation Reviewed laboratory values. Potassium 2.6.  Patient otherwise hemodynamically stable .hemodynamically stable.   Pain management: Hypotonic IV fluid for cellular rehydration  Toradol 30 mg IV times 1 for inflammations Started Dilaudid PCA per weight based protocol. Jema used a total of 12.5 mg with 24demands and 23 deliveries.  Pain intensity decrease from 8/10 to 6/10. Patient stable and will discharge with family in stable condition   Hypokalemia: Potassium 2.6, potassium added to IV fluids at 20 mEQ per hour. Also, administered Kdur 40 mEQ. Repeated potassium level, increased to 3.1.  Patient to follow up in office on Friday, December 18, 2016 for labs   Discharge instructions: Resume all home medications Follow up in primary care with Joaquin Courts, FNP as scheduled  Discussed the importance of drinking 64 ounces of water daily.  The Importance of Water. To help prevent pain crises, it is important to drink plenty of water throughout the day. This is because dehydration of red blood cells may lead to the sickling process.     The patient was given clear instructions to go to ER or return to medical center if symptoms do not improve, worsen or new problems develop. The patient verbalized understanding.     Physical Exam at Discharge:  BP (!) 107/57 (BP Location: Left Arm)   Pulse 89   Temp 97.8 F  (36.6 C) (Oral)   Resp 19   Ht 5' (1.524 m)   Wt 132 lb (59.9 kg)   SpO2 98%   BMI 25.78 kg/m    General Appearance:    Alert, cooperative, no distress, appears stated age  Head:    Normocephalic, without obvious abnormality, atraumatic  Eyes:    PERRL, conjunctiva/corneas clear, EOM's intact, fundi    benign, both eyes  Neck:   Supple, symmetrical, trachea midline, no adenopathy;    thyroid:  no enlargement/tenderness/nodules; no carotid   bruit or JVD  Back:     Symmetric, no curvature, ROM normal, no CVA tenderness  Lungs:     Clear to auscultation bilaterally, respirations unlabored  Chest Wall:    No tenderness or deformity   Heart:    Regular rate and rhythm, S1 and S2 normal, no murmur, rub   or gallop  Neurologic:   CNII-XII intact, normal strength, sensation and reflexes    throughout    Disposition at Discharge: 01-Home or Self Care  Discharge Orders:   Condition at Discharge:   Stable  Time spent on Discharge:  Greater than 30 minutes.  Signed: [name],Lachina M 12/10/2016, 3:41 PM

## 2016-12-10 NOTE — Progress Notes (Signed)
Brittany Foster, a 31 year old female with a history of sickle cell anemia, HbSS was admitted to the day infusion center for a sickle cell pain crisis. Patient has a critical potassium of 2.6. Will add 20 mEQ to IV fluids. Patient also given KDur 20 meQ times 1. Will re-check potassium level prior to discharge.    Nolon Nations  MSN, FNP-C Patient Care [facility] [address] [place], [place] [address] [phone]

## 2016-12-10 NOTE — H&P (Signed)
[facility] History and Physical   Date: 12/10/2016  Patient name: Brittany Foster Medical record number: [id number] Date of birth: [birth year] Age: 31 y.o. Gender: female PCP: Bing Neighbors, FNP  Attending physician: Quentin Angst, MD  Chief Complaint: Sickle cell crisis  History of Present Illness: Brittany Foster,a  31 year old female with a history of sickle cell anemia, HbSS presents complaining of generalized pain that is consistent with sickle cell anemia. Pain is primarily to lower extremities and back. Current pain intensity is 8/10 described as constant and throbbing. She says that she has been taking MS Contin and Percocet 10-325 mg without sustained relief. She last had medications this am. She endorses nausea. She denies headache, chest pain, shortness of breath, dysuria, constipation or diarrhea.  Meds: Prescriptions Prior to Admission  Medication Sig Dispense Refill Last Dose  . ARIPiprazole (ABILIFY) 10 MG tablet Take 10 mg by mouth daily  3 Past Week at Unknown time  . DULoxetine (CYMBALTA) 60 MG capsule TAKE 1 CAPSULE BY MOUTH DAILY. 30 capsule 3 Past Week at Unknown time  . folic acid (FOLVITE) 1 MG tablet Take 1 tablet (1 mg total) by mouth daily. 90 tablet 3 Past Week at Unknown time  . hydroxyurea (HYDREA) 500 MG capsule TAKE 2 CAPSULES BY MOUTH DAILY. MAY TAKE WITH FOOD TO MINIMIZE GI SIDE EFFECTS. (Patient taking differently: Take 1,500 mg by mouth daily. ) 180 capsule 3 Past Week at Unknown time  . medroxyPROGESTERone (DEPO-PROVERA) 150 MG/ML injection Inject 150 mg into the muscle every 3 (three) months.    Past Month at Unknown time  . Topiramate ER 100 MG CP24 Take 200 mg by mouth at bedtime. (Patient taking differently: Take 100 mg by mouth at bedtime. ) 60 capsule 3 Past Week at Unknown time  . ibuprofen (ADVIL,MOTRIN) 200 MG tablet Take 400 mg by mouth every 6 (six) hours as needed.   Unknown at Unknown time    Allergies: Ultram  [tramadol]; Zofran [ondansetron hcl]; Buprenorphine hcl; Fentanyl; Morphine and related; and Tape Past Medical History:  Diagnosis Date  . Anemia   . Depression, major, recurrent (HCC)   . Migraines   . Sickle cell anemia (HCC)    Past Surgical History:  Procedure Laterality Date  . CESAREAN SECTION    . CHOLECYSTECTOMY  2000  . IR GENERIC HISTORICAL  10/08/2015   IR US GUIDE VASC ACCESS RIGHT 10/08/2015 Simonne Come, MD [facility]  . IR GENERIC HISTORICAL  10/08/2015   IR FLUORO GUIDE CV LINE RIGHT 10/08/2015 Simonne Come, MD [facility]  . MULTIPLE TOOTH EXTRACTIONS N/A   . port a cath placement Right    about 6-7 years ago  . removal of porta cath Right 09/11/15  . TUBAL LIGATION     Family History  Problem Relation Age of Onset  . Sickle cell trait Father   . Sickle cell trait Mother   . Sickle cell anemia Other    Social History   Social History  . Marital status: Single    Spouse name: N/A  . Number of children: N/A  . Years of education: N/A   Occupational History  . None    Social History Main Topics  . Smoking status: Current Some Day Smoker    Packs/day: 1.00  . Smokeless tobacco: Current User  . Alcohol use No  . Drug use: No  . Sexual activity: Yes    Partners: Male    Birth control/ protection: Injection  Comment: 1 new partner recently, no concern about STI, uses condoms   Other Topics Concern  . Not on file   Social History Narrative   80 year old daughter    Review of Systems: Review of Systems  Constitutional: Negative.   HENT: Negative.   Eyes: Negative.   Respiratory: Negative.   Cardiovascular: Negative.   Genitourinary: Negative.   Musculoskeletal: Positive for back pain and joint pain.  Skin: Negative.   Neurological: Negative.   Psychiatric/Behavioral: Negative.     Physical Exam: Blood pressure 105/74, temperature 99 F (37.2 C), temperature source Oral, resp. rate 12, height 5' (1.524 m), weight 132 lb (59.9 kg), SpO2 100  %. BP 105/74 (BP Location: Left Arm)   Temp 99 F (37.2 C) (Oral)   Resp 12   Ht 5' (1.524 m)   Wt 132 lb (59.9 kg)   SpO2 100%   BMI 25.78 kg/m   General Appearance:    Alert, cooperative, no distress, appears stated age  Head:    Normocephalic, without obvious abnormality, atraumatic  Eyes:    PERRL, conjunctiva/corneas clear, EOM's intact, fundi    benign, both eyes  Ears:    Normal TM's and external ear canals, both ears  Nose:   Nares normal, septum midline, mucosa normal, no drainage    or sinus tenderness  Throat:   Lips, mucosa, and tongue normal; teeth and gums normal  Neck:   Supple, symmetrical, trachea midline, no adenopathy;    thyroid:  no enlargement/tenderness/nodules; no carotid   bruit or JVD  Back:     Symmetric, no curvature, ROM normal, no CVA tenderness  Lungs:     Clear to auscultation bilaterally, respirations unlabored  Chest Wall:    No tenderness or deformity   Heart:    Regular rate and rhythm, S1 and S2 normal, no murmur, rub   or gallop  Abdomen:     Soft, non-tender, bowel sounds active all four quadrants,    no masses, no organomegaly  Extremities:   Extremities normal, atraumatic, no cyanosis or edema  Pulses:   2+ and symmetric all extremities  Skin:   Skin color, texture, turgor normal, no rashes or lesions  Lymph nodes:   Cervical, supraclavicular, and axillary nodes normal  Neurologic:   CNII-XII intact, normal strength, sensation and reflexes    throughout    Lab results: No results found for this or any previous visit (from the past 24 hour(s)).  Imaging results:  No results found.   Assessment & Plan:  Patient will be admitted to the day infusion center for extended observation  Start IV D5.45 for cellular rehydration at 75/hr  Start Dilaudid PCA High Concentration per weight based protocol. Loading dose of 1 mg   Promethazine 25 mg po times 1 for nausea   Patient will be re-evaluated for pain intensity in the context of  function and relationship to baseline as care       progresses.  If no significant pain relief, will transfer patient to inpatient services for a higher level of care.   Will check CMP,  Reticulocytes and CBC w/differential  Brittany Foster M 12/10/2016, 12:06 PM

## 2016-12-21 ENCOUNTER — Telehealth (HOSPITAL_COMMUNITY): Payer: Self-pay | Admitting: *Deleted

## 2016-12-21 ENCOUNTER — Ambulatory Visit: Payer: Self-pay | Admitting: Family Medicine

## 2016-12-21 NOTE — Telephone Encounter (Signed)
[facility] c/o Bilateral hip and leg pain. Patient rates pain a 8/10 on pain scale. Patient denies, fever, chest pain, N/V/D or abdominal pain. Patient reports last taking her home medication Oxycodone around 6:30 this morning with no relief.    Patient placed on a brief hold and provider notified. Patient advised to stay at home rest, hydrate, and take her medications around the clock as prescribed. Patient states an understanding.

## 2016-12-28 ENCOUNTER — Telehealth (HOSPITAL_COMMUNITY): Payer: Self-pay | Admitting: *Deleted

## 2016-12-28 NOTE — Telephone Encounter (Signed)
Patient called [facility] the [facility] c/o rib and hip pain. Patient rates pain a 8/10 on pain scale. Patient denies fever, chest pain, vomiting or Diarrhea. Patient also denies abdominal pain but stated she is nauseous. Patient reports last taking her home medication Oxycodone 10-325 at 7 am with no relief. Patient told to expect a return phone call once provider is notified. Patient stated an understanding.

## 2016-12-28 NOTE — Telephone Encounter (Signed)
Patient called back at this time and advised to stay home rest, hydrate and continue to take medication as prescribed per provider Jerrilyn CairoKim Harris FNP. Patient did not answer the phone RN left a voicemail.

## 2016-12-29 ENCOUNTER — Telehealth (HOSPITAL_COMMUNITY): Payer: Self-pay

## 2016-12-29 NOTE — Telephone Encounter (Signed)
Patient called complaining of hip pain, rated 8/10.  She stated she last took Oxycodone 10mg  at 7AM.  She denied fever, chest pain, abdominal pain,  vomiting, or diarrhea.  She states she is having some nausea.  Checked with Jerrilyn CairoKim Harris, FNP-C and she stated patient needed to hydrate and take her oral medication.  I returned a call to the patient at 805AM informing her of the same.

## 2016-12-30 ENCOUNTER — Telehealth (HOSPITAL_COMMUNITY): Payer: Self-pay | Admitting: *Deleted

## 2016-12-30 NOTE — Telephone Encounter (Signed)
[facility] c/o bilateral hip and rib pain. Patient rates pain a 7/10 on pain scale. Patient denies fever, chest pain, vomiting or Diarrhea. Patient reports she is nauseous but has not vomited. Patient last took her home medication Oxycodone around 6 am with no relief.   Patient placed on a brief hold and provider notified. Patient advised to stay at home hydrate, rest, and take home medication around the clock as prescribed per Julianne HandlerLachina [name] FNP. Patient seemed angry and hung up.

## 2017-01-01 ENCOUNTER — Telehealth (HOSPITAL_COMMUNITY): Payer: Self-pay | Admitting: Internal Medicine

## 2017-01-01 NOTE — Telephone Encounter (Signed)
Pt called and states experiencing pain in rib cage; denies having shortness of breath, chest pain, nausea, vomiting, or diarrhea; states that she is taking prescribed home pain medications; NP notified; pt advised to take home medications as prescribed and increase fluid intake; pt verbalizes understanding

## 2017-01-09 MED ORDER — GENERIC EXTERNAL MEDICATION
Status: DC
Start: ? — End: 2017-01-09

## 2017-01-09 MED ORDER — SODIUM CHLORIDE 0.9 % IV SOLN
INTRAVENOUS | Status: DC
Start: ? — End: 2017-01-09

## 2017-01-11 ENCOUNTER — Telehealth (HOSPITAL_COMMUNITY): Payer: Self-pay | Admitting: *Deleted

## 2017-01-11 NOTE — Telephone Encounter (Signed)
[facility]. Patient c/o a 6 out of 10 hips,legs, and back pain on a 1-10 pain scale [facility]. Patient denies fevers, chest pain, nausea, vomiting, diarrhea, and abdominal pain. Patient verbalized she last took her oxycodone at 0800.   Patient made aware she will receive a phone call back once provider is notified.   Attempted to call patient back and left message to return call back.

## 2017-01-12 ENCOUNTER — Telehealth: Payer: Self-pay | Admitting: Internal Medicine

## 2017-01-12 ENCOUNTER — Telehealth (HOSPITAL_COMMUNITY): Payer: Self-pay | Admitting: *Deleted

## 2017-01-12 NOTE — Telephone Encounter (Signed)
Patient called complaining of pain in legs and hips. Patient had been seen in ED the previous day. Selena BattenKim, FNP notified and advised patient to stay home, hydrate and rest. Patient given this information.

## 2017-01-12 NOTE — Telephone Encounter (Signed)
Received a call from NP (Amy) in the ED at [facility] for transfer of patient- Brittany CampbellMiranda Foster as she receives her care her for Sickle Cell Disease. Advised Amy that [name] in fact does not receive care for her [facility]le Cell [facility] but that [facility] is one of many facilities where she presents for acute care. NP said that she will [facility].   I have recently received information from Dr. Hyman Foster indicating that [name] is now being seen in [place] for her sickle cell disease. I spoke with Dr. Hyman Foster who reinforced that patient has been offered receipt of her Primary [facility] the [facility] but hs not shown up for any appointments. He further advised that she indeed does receive care for her sickle cell disease and also to a pain clinic at a location which is unknown to him.   [name],[name]

## 2017-01-20 MED ORDER — POTASSIUM CHLORIDE ER 10 MEQ PO TBCR
EXTENDED_RELEASE_TABLET | ORAL | Status: DC
Start: 2017-01-18 — End: 2017-01-20

## 2017-01-20 MED ORDER — ACETAMINOPHEN 650 MG RE SUPP
650.00 | RECTAL | Status: DC
Start: ? — End: 2017-01-20

## 2017-01-20 MED ORDER — ARIPIPRAZOLE 10 MG PO TABS
10.00 | ORAL_TABLET | ORAL | Status: DC
Start: 2017-01-18 — End: 2017-01-20

## 2017-01-20 MED ORDER — GUAIFENESIN 100 MG/5ML PO LIQD
200.00 | ORAL | Status: DC
Start: ? — End: 2017-01-20

## 2017-01-20 MED ORDER — TOPIRAMATE 25 MG PO TABS
50.00 | ORAL_TABLET | ORAL | Status: DC
Start: 2017-01-18 — End: 2017-01-20

## 2017-01-20 MED ORDER — SODIUM CHLORIDE 0.9 % IV SOLN
INTRAVENOUS | Status: DC
Start: ? — End: 2017-01-20

## 2017-01-20 MED ORDER — BENZOCAINE 10 % MT GEL
OROMUCOSAL | Status: DC
Start: ? — End: 2017-01-20

## 2017-01-20 MED ORDER — CAMPHOR-MENTHOL 0.5-0.5 % EX LOTN
TOPICAL_LOTION | CUTANEOUS | Status: DC
Start: ? — End: 2017-01-20

## 2017-01-20 MED ORDER — HYDROCODONE-ACETAMINOPHEN 10-325 MG PO TABS
ORAL_TABLET | ORAL | Status: DC
Start: ? — End: 2017-01-20

## 2017-01-20 MED ORDER — GENERIC EXTERNAL MEDICATION
Status: DC
Start: ? — End: 2017-01-20

## 2017-01-20 MED ORDER — IBUPROFEN 200 MG PO TABS
200.00 | ORAL_TABLET | ORAL | Status: DC
Start: ? — End: 2017-01-20

## 2017-01-20 MED ORDER — HYDROMORPHONE HCL 2 MG/ML IJ SOLN
2.00 | INTRAMUSCULAR | Status: DC
Start: ? — End: 2017-01-20

## 2017-01-20 MED ORDER — NITROGLYCERIN 0.4 MG SL SUBL
.40 | SUBLINGUAL_TABLET | SUBLINGUAL | Status: DC
Start: ? — End: 2017-01-20

## 2017-01-20 MED ORDER — FOLIC ACID 1 MG PO TABS
1.00 | ORAL_TABLET | ORAL | Status: DC
Start: 2017-01-19 — End: 2017-01-20

## 2017-01-20 MED ORDER — OXYCODONE HCL 10 MG PO TABS
10.00 | ORAL_TABLET | ORAL | Status: DC
Start: ? — End: 2017-01-20

## 2017-01-20 MED ORDER — ACETAMINOPHEN 325 MG PO TABS
650.00 | ORAL_TABLET | ORAL | Status: DC
Start: ? — End: 2017-01-20

## 2017-01-20 MED ORDER — DULOXETINE HCL 60 MG PO CPEP
60.00 | ORAL_CAPSULE | ORAL | Status: DC
Start: 2017-01-19 — End: 2017-01-20

## 2017-01-20 MED ORDER — ANTACID & ANTIGAS 200-200-20 MG/5ML PO SUSP
30.00 | ORAL | Status: DC
Start: ? — End: 2017-01-20

## 2017-01-20 MED ORDER — HYDROXYUREA 500 MG PO CAPS
500.00 | ORAL_CAPSULE | ORAL | Status: DC
Start: 2017-01-19 — End: 2017-01-20

## 2017-01-20 MED ORDER — DIPHENHYDRAMINE HCL 25 MG PO CAPS
25.00 | ORAL_CAPSULE | ORAL | Status: DC
Start: ? — End: 2017-01-20

## 2017-01-20 MED ORDER — ENOXAPARIN SODIUM 40 MG/0.4ML ~~LOC~~ SOLN
40.00 | SUBCUTANEOUS | Status: DC
Start: 2017-01-19 — End: 2017-01-20

## 2017-01-27 ENCOUNTER — Encounter: Payer: Self-pay | Admitting: Family Medicine

## 2017-01-27 ENCOUNTER — Ambulatory Visit (INDEPENDENT_AMBULATORY_CARE_PROVIDER_SITE_OTHER): Payer: Medicaid Other | Admitting: Family Medicine

## 2017-01-27 VITALS — BP 112/66 | HR 100 | Temp 98.6°F | Resp 14 | Ht 60.0 in | Wt 129.6 lb

## 2017-01-27 DIAGNOSIS — F1129 Opioid dependence with unspecified opioid-induced disorder: Secondary | ICD-10-CM

## 2017-01-27 DIAGNOSIS — D571 Sickle-cell disease without crisis: Secondary | ICD-10-CM

## 2017-01-27 DIAGNOSIS — Z3042 Encounter for surveillance of injectable contraceptive: Secondary | ICD-10-CM

## 2017-01-27 LAB — POCT URINALYSIS DIP (DEVICE)
BILIRUBIN URINE: NEGATIVE
GLUCOSE, UA: NEGATIVE mg/dL
Hgb urine dipstick: NEGATIVE
Ketones, ur: NEGATIVE mg/dL
LEUKOCYTES UA: NEGATIVE
NITRITE: NEGATIVE
Protein, ur: NEGATIVE mg/dL
Specific Gravity, Urine: 1.015 (ref 1.005–1.030)
UROBILINOGEN UA: 1 mg/dL (ref 0.0–1.0)
pH: 6 (ref 5.0–8.0)

## 2017-01-27 LAB — POCT URINE PREGNANCY: Preg Test, Ur: NEGATIVE

## 2017-01-27 MED ORDER — ACETAMINOPHEN 325 MG PO TABS
650.00 | ORAL_TABLET | ORAL | Status: DC
Start: ? — End: 2017-01-27

## 2017-01-27 MED ORDER — ANTACID & ANTIGAS 200-200-20 MG/5ML PO SUSP
30.00 | ORAL | Status: DC
Start: ? — End: 2017-01-27

## 2017-01-27 MED ORDER — IBUPROFEN 200 MG PO TABS
200.00 | ORAL_TABLET | ORAL | Status: DC
Start: ? — End: 2017-01-27

## 2017-01-27 MED ORDER — KETOROLAC TROMETHAMINE 30 MG/ML IJ SOLN
30.0000 mg | Freq: Once | INTRAMUSCULAR | Status: AC
Start: 2017-01-27 — End: 2017-01-27
  Administered 2017-01-27: 30 mg via INTRAMUSCULAR

## 2017-01-27 MED ORDER — CAMPHOR-MENTHOL 0.5-0.5 % EX LOTN
TOPICAL_LOTION | CUTANEOUS | Status: DC
Start: ? — End: 2017-01-27

## 2017-01-27 MED ORDER — TRAZODONE HCL 50 MG PO TABS: 25.0000 mg | ORAL_TABLET | Freq: Every evening | ORAL | 1 refills | Status: DC | PRN

## 2017-01-27 MED ORDER — NITROGLYCERIN 0.4 MG SL SUBL
0.40 | SUBLINGUAL_TABLET | SUBLINGUAL | Status: DC
Start: ? — End: 2017-01-27

## 2017-01-27 MED ORDER — SODIUM CHLORIDE 0.9 % IV SOLN
INTRAVENOUS | Status: DC
Start: ? — End: 2017-01-27

## 2017-01-27 MED ORDER — POTASSIUM CHLORIDE ER 10 MEQ PO TBCR
EXTENDED_RELEASE_TABLET | ORAL | Status: DC
Start: 2017-01-25 — End: 2017-01-27

## 2017-01-27 MED ORDER — DULOXETINE HCL 30 MG PO CPEP
60.00 | ORAL_CAPSULE | ORAL | Status: DC
Start: 2017-01-26 — End: 2017-01-27

## 2017-01-27 MED ORDER — DIPHENHYDRAMINE HCL 25 MG PO CAPS
25.00 | ORAL_CAPSULE | ORAL | Status: DC
Start: ? — End: 2017-01-27

## 2017-01-27 MED ORDER — ARIPIPRAZOLE 10 MG PO TABS
10.00 | ORAL_TABLET | ORAL | Status: DC
Start: 2017-01-25 — End: 2017-01-27

## 2017-01-27 MED ORDER — GENERIC EXTERNAL MEDICATION
Status: DC
Start: ? — End: 2017-01-27

## 2017-01-27 MED ORDER — LACTULOSE 20 G PO PACK: 20.0000 g | PACK | Freq: Three times a day (TID) | ORAL | 0 refills | Status: DC | PRN

## 2017-01-27 MED ORDER — KETOROLAC TROMETHAMINE 30 MG/ML IJ SOLN: 30.0000 mg | Freq: Once | INTRAMUSCULAR | Status: DC

## 2017-01-27 MED ORDER — TOPIRAMATE 25 MG PO TABS
50.00 | ORAL_TABLET | ORAL | Status: DC
Start: 2017-01-25 — End: 2017-01-27

## 2017-01-27 MED ORDER — FOLIC ACID 1 MG PO TABS
1.00 | ORAL_TABLET | ORAL | Status: DC
Start: 2017-01-26 — End: 2017-01-27

## 2017-01-27 MED ORDER — OXYCODONE HCL 10 MG PO TABS
10.00 | ORAL_TABLET | ORAL | Status: DC
Start: ? — End: 2017-01-27

## 2017-01-27 MED ORDER — MEDROXYPROGESTERONE ACETATE 150 MG/ML IM SUSP
150.0000 mg | Freq: Once | INTRAMUSCULAR | Status: AC
  Administered 2017-01-27: 150 mg via INTRAMUSCULAR

## 2017-01-27 MED ORDER — HYDROXYUREA 500 MG PO CAPS
500.00 | ORAL_CAPSULE | ORAL | Status: DC
Start: 2017-01-25 — End: 2017-01-27

## 2017-01-27 MED ORDER — BENZOCAINE 10 % MT GEL
OROMUCOSAL | Status: DC
Start: ? — End: 2017-01-27

## 2017-01-27 MED ORDER — HYDROMORPHONE HCL 1 MG/ML IJ SOLN
1.00 | INTRAMUSCULAR | Status: DC
Start: ? — End: 2017-01-27

## 2017-01-27 MED ORDER — METHOCARBAMOL 500 MG PO TABS: 500.0000 mg | ORAL_TABLET | Freq: Four times a day (QID) | ORAL | 0 refills | Status: DC

## 2017-01-27 MED ORDER — GUAIFENESIN 100 MG/5ML PO LIQD
200.00 | ORAL | Status: DC
Start: ? — End: 2017-01-27

## 2017-01-27 MED ORDER — PROMETHAZINE HCL 25 MG PO TABS
25.00 | ORAL_TABLET | ORAL | Status: DC
Start: ? — End: 2017-01-27

## 2017-01-27 MED ORDER — ACETAMINOPHEN 650 MG RE SUPP
650.00 | RECTAL | Status: DC
Start: ? — End: 2017-01-27

## 2017-01-27 MED ORDER — ENOXAPARIN SODIUM 40 MG/0.4ML ~~LOC~~ SOLN
40.00 | SUBCUTANEOUS | Status: DC
Start: 2017-01-26 — End: 2017-01-27

## 2017-01-27 MED ORDER — HYDROCODONE-ACETAMINOPHEN 10-325 MG PO TABS
ORAL_TABLET | ORAL | Status: DC
Start: ? — End: 2017-01-27

## 2017-01-27 NOTE — Patient Instructions (Addendum)
As we discussed, I will start the process of getting you accepted into a outpatient treatment program.   It is imperative that you follow through with addiction rehab.  Call [facility] and schedule a new patient appointment.  I will have Ms. Marsha contact you with outpatient treatment options.    Finding Treatment for Addiction What is addiction? Addiction is a complex disease of the brain. It causes an uncontrollable (compulsive) need for a substance. You can be addicted to alcohol, illegal drugs, or prescription medicines such as painkillers. Addiction can also be a behavior, like gambling or shopping. The need for the drug or activity can become so strong that you think about it all the time. You can also become physically dependent on a substance. Addiction can change the way your brain works. Because of these changes, getting more of whatever you are addicted to becomes the most important thing to you and feels better than other activities or relationships. Addiction can lead to changes in health, behavior, emotions, relationships, and choices that affect you and everyone around you. How do I know if I need treatment for addiction? Addiction is a progressive disease. Without treatment, addiction can get worse. Living with addiction puts you at higher risk for injury, poor health, lost employment, loss of money, and even death. You might need treatment for addiction if:  You have tried to stop or cut down, but you cannot.  Your addiction is causing physical health problems.  You find it annoying that your friends and family are concerned about your alcohol or substance use.  You feel guilty about substance abuse or a compulsive behavior.  You have lied or tried to hide your addiction.  You need a particular substance or activity to start your day or to calm down.  You are getting in trouble at school, work, home, or with the police.  You have done something illegal to support your  addiction.  You are running out of money because of your addiction.  You have no time for anything other than your addiction.  What types of treatment are available? The treatment program that is right for you will depend on many factors, including the type of addiction you have. Treatment programs can be outpatient or inpatient. In an outpatient program, you live at home and go to work or school, but you also go to a clinic for treatment. With an inpatient program, you live and sleep at the program facility during treatment. After treatment, you might need a plan for support during recovery. Other treatment options include:  Medicine. ? Some addictions may be treated with prescription medicines. ? You might also need medicine to treat anxiety or depression.  Counseling and behavior therapy. Therapy can help individuals and families behave in healthier ways and relate more effectively.  Support groups. Confidential group therapy, such as a 12-step program, can help individuals and families during treatment and recovery.  No single type of program is right for everyone. Many treatment programs involve a combination of education, counseling, and a 12-step, spiritually-based approach. Some treatment programs are government sponsored. They are geared for patients who do not have private insurance. Treatment programs can vary in many respects, such as:  Cost and types of insurance that are accepted.  Types of on-site medical services that are offered.  Length of stay, setting, and size.  Overall philosophy of treatment.  What should I consider when selecting a treatment program? It is important to think about your individual requirements when  selecting a treatment program. There are a number of things to consider, such as:  If the program is certified by the appropriate government agency. Even private programs must be certified and employ certified professionals.  If the program is covered  by your insurance. If finances are a concern, the first call you should make is to your insurance company, if you have health insurance. Ask for a list of treatment programs that are in your network, and confirm any copayments and deductibles that you may have to pay. ? If you do not have insurance, or if you choose to attend a program that does not accept your insurance, discuss whether a payment plan can be set up.  If treatment is available in languages other than English, if needed.  If the program offers detoxification treatment, if needed.  If 12-step meetings are held at the center or if transport is available for patients to attend meetings at other locations.  If the program is professional, organized, and clean.  If the program meets all of your needs, including physical and cultural needs.  If the facility offers specific treatment for your particular addiction.  If support continues to be offered after you have left the program.  If your treatment plan is continually looked at to make sure you are receiving the right treatment at the right time.  If mental health counseling is part of your treatment.  If medicine is included in treatment, if needed.  If your family is included in your treatment plan and if support is offered to them throughout the treatment process.  How the treatment works to prevent relapse.  Where else can I get help?  Your health care provider. Ask him or her to help you find addiction treatment. These discussions are confidential.  The [employer] on Alcoholism and Drug Dependence (NCADD). This group has information about treatment centers and programs for people who have an addiction and for family members. ? The telephone number is 1-800-NCA-CALL (972-558-33601-2563727233). ? The website is https://ncadd.org/about-ncadd/our-affiliates  The Substance Abuse and Mental Health Services Administration [facility](SAMHSA). This group will help you find publicly funded  treatment centers, help hotlines, and counseling services near you. ? The telephone number is 1-800-662-HELP (62960532381-941-070-6705). ? The website is www.findtreatment.[employer].plsamhsa.gov In countries outside of the [place], look in [employer] directories for contact information for services in your area. This information is not intended to replace advice given to you by your health care provider. Make sure you discuss any questions you have with your health care provider. Document Released: 01/15/2005 Document Revised: 01/13/2016 Document Reviewed: 12/05/2013 Elsevier Interactive Patient Education  2017 [employer]

## 2017-01-27 NOTE — Progress Notes (Addendum)
Brittany Foster Massey, is a 31 y.o. female  WUJ:[id number]:[id number]  [id number]:[id number]  DOB - 01/08/1986  CC:  Chief Complaint  Patient presents with  . Follow-up    Sickle Cell        HPI: Brittany Foster Isham is a 31 y.o. female is here today for primary care follow-up of sickle cele anemia. Medical problems significant for Hb-SS disease, opioid dependence, and major depression disorder.  Juanelle's last office visit was back in July 2018.  Has canceled several appointments and presents today following a ED admission at [facility] in [place].  Brittany Foster has had multiple ED visits to multiple healthcare systems over the course of several months.  She was referred to a hematologist in [place] who agreed to manage her pain and sickle cell disease however patient has failed to return for follow-up.  Of recent, Brittany Foster was accepted into [facility] sickle cell clinic and reports that she has not followed up there either due to transportation problems.  Cleota acknowledges that she has a dependency on intravenous opiate medication.  She reports that she feels that if she does not have the IV pain medication her sickle cell pain worsens and is unresponsive to oral pain medication. She reports today that she is ready for treatment and detox in order to treat her opioid dependency.  She is also today requesting admission into the day infusion center however there are no available beds.  She currently rates her current level of pain a 7 out of 10, which is generalized to her lower back and lower legs. She has taken oral pain medication prescribed to her by ED provider without significant resolution of pain. She denies headache, chest pain, abdominal pain, nausea, new weakness tingling or numbness, and cough.   Allergies  Allergen Reactions  . Ultram [Tramadol] Other (See Comments)    Reaction:  Seizures   . Zofran [Ondansetron Hcl] Nausea And Vomiting  . Buprenorphine Hcl Hives and Other (See Comments)    Reaction:  Shaking    . Fentanyl Hives  . Morphine And Related Hives and Other (See Comments)    Reaction:  Shaking   . Tape Rash   Past Medical History:  Diagnosis Date  . Anemia   . Depression, major, recurrent (HCC)   . Migraines   . Sickle cell anemia (HCC)    Current Outpatient Medications on File Prior to Visit  Medication Sig Dispense Refill  . ARIPiprazole (ABILIFY) 10 MG tablet Take 10 mg by mouth daily  3  . DULoxetine (CYMBALTA) 60 MG capsule TAKE 1 CAPSULE BY MOUTH DAILY. 30 capsule 3  . folic acid (FOLVITE) 1 MG tablet Take 1 tablet (1 mg total) by mouth daily. 90 tablet 3  . hydroxyurea (HYDREA) 500 MG capsule TAKE 2 CAPSULES BY MOUTH DAILY. MAY TAKE WITH FOOD TO MINIMIZE GI SIDE EFFECTS. (Patient taking differently: Take 1,500 mg by mouth daily. ) 180 capsule 3  . ibuprofen (ADVIL,MOTRIN) 200 MG tablet Take 400 mg by mouth every 6 (six) hours as needed.    . medroxyPROGESTERone (DEPO-PROVERA) 150 MG/ML injection Inject 150 mg into the muscle every 3 (three) months.     . Topiramate ER 100 MG CP24 Take 200 mg by mouth at bedtime. (Patient taking differently: Take 100 mg by mouth at bedtime. ) 60 capsule 3   No current facility-administered medications on file prior to visit.    Family History  Problem Relation Age of Onset  . Sickle cell trait Father   . Sickle  cell trait Mother   . Sickle cell anemia Other    Social History   Socioeconomic History  . Marital status: Single    Spouse name: Not on file  . Number of children: Not on file  . Years of education: Not on file  . Highest education level: Not on file  Social Needs  . Financial resource strain: Not on file  . Food insecurity - worry: Not on file  . Food insecurity - inability: Not on file  . Transportation needs - medical: Not on file  . Transportation needs - non-medical: Not on file  Occupational History  . Occupation: None  Tobacco Use  . Smoking status: Current Some Day Smoker    Packs/day: 1.00  . Smokeless  tobacco: Current User  Substance and Sexual Activity  . Alcohol use: No    Alcohol/week: 0.0 oz  . Drug use: No  . Sexual activity: Yes    Partners: Male    Birth control/protection: Injection    Comment: 1 new partner recently, no concern about STI, uses condoms  Other Topics Concern  . Not on file  Social History Narrative   32 year old daughter    Review of Systems: Constitutional: Negative for fever, chills, diaphoresis, activity change, appetite change and fatigue. HENT: Negative for ear pain, nosebleeds, congestion, facial swelling, rhinorrhea, neck pain, neck stiffness and ear discharge.  Eyes: Negative for pain, discharge, redness, itching and visual disturbance. Respiratory: Negative for cough, choking, chest tightness, shortness of breath, wheezing and stridor.  Cardiovascular: Negative for chest pain, palpitations and leg swelling. Gastrointestinal: Negative for abdominal distention. Genitourinary: Negative for dysuria, urgency, frequency, hematuria, flank pain, decreased urine volume, difficulty urinating and dyspareunia.  Musculoskeletal: Negative for back pain, joint swelling, arthralgia and gait problem. Neurological: Negative for dizziness, tremors, seizures, syncope, facial asymmetry, speech difficulty, weakness, light-headedness, numbness and headaches.  Hematological: Negative for adenopathy. Does not bruise/bleed easily. Psychiatric/Behavioral: Negative for hallucinations, behavioral problems, confusion, dysphoric mood, decreased concentration and agitation.    Objective:   Vitals:   01/27/17 1040  BP: 112/66  Pulse: 100  Resp: 14  Temp: 98.6 F (37 C)  SpO2: 100%    Physical Exam: Constitutional: Patient appears well-developed and well-nourished. No distress. HENT: Normocephalic, atraumatic, External right and left ear normal. Oropharynx is clear and moist.  Eyes: Conjunctivae and EOM are normal. PERRLA, no scleral icterus. Neck: Normal ROM. Neck  supple. No JVD. No tracheal deviation. No thyromegaly. CVS: RRR, S1/S2 +, no murmurs, no gallops, no carotid bruit.  Pulmonary: Effort and breath sounds normal, no stridor, rhonchi, wheezes, rales.  Abdominal: Soft. BS +, no distension, tenderness, rebound or guarding.  Musculoskeletal: Normal range of motion. No edema and no tenderness.  Lymphadenopathy: No lymphadenopathy noted, cervical, inguinal or axillary Neuro: Alert. Normal reflexes, muscle tone coordination. No cranial nerve deficit. Skin: Skin is warm and dry. No rash noted. Not diaphoretic. No erythema. No pallor. Psychiatric: Normal mood and affect. Behavior, judgment, thought content normal.  Lab Results  Component Value Date   WBC 7.6 12/10/2016   HGB 8.5 (L) 12/10/2016   HCT 24.0 (L) 12/10/2016   MCV 114.8 (H) 12/10/2016   PLT 199 12/10/2016   Lab Results  Component Value Date   CREATININE 0.40 (L) 12/10/2016   BUN 5 (L) 12/10/2016   NA 140 12/10/2016   K 3.1 (L) 12/10/2016   CL 108 12/10/2016   CO2 24 12/10/2016    No results found for: HGBA1C Lipid Panel  No results found for: CHOL, TRIG, HDL, CHOLHDL, VLDL, LDLCALC      Assessment and plan:  1. Family planning, Depo-Provera contraception monitoring/administration - POCT urine pregnancy-negative  - start medroxyPROGESTERone (DEPO-PROVERA) injection 150 mg.  2. Hb-SS disease without crisis (HCC), stable Taquita has visited multiple health systems ED multiple times over the course of several months as well as received admission into the day infusion center for management sickle cell crisis related pain. Hemoglobin averages 8-9 g/dl.  Due to multiple breaches of her medication contract patient is unable to receive any controlled occasions from this practice.  I will treat current pain with Toradol. Prescribed patient the methocarbamol 500 mg up to 4 times daily to help with pain symptoms.  Educated extensively the dangers of chronic opioid addiction, increased  risk of infection due to port access, and fluid overload associated with continued ED visits at multiple health systems. Patient verbalized understanding.   - 3. Opioid dependence with opioid-induced disorder [facility]) [place] has verbally agreed today to pursue outpatient rehab proceeding inpatient detox.  I will follow-up with sickle cell agency their assistance in arranging detox as well as transportation for patient.  Jahara has been advised, that this will likely be the last time she is able to a set up and establish resources for her as she has failed to comply with previous efforts to get her into a substance abuse program.  She has verbalized understanding and agreed to proceed with detox.    Meds ordered this encounter  Medications  . medroxyPROGESTERone (DEPO-PROVERA) injection 150 mg  . methocarbamol (ROBAXIN) 500 MG tablet    Sig: Take 1 tablet (500 mg total) by mouth 4 (four) times daily.    Dispense:  120 tablet    Refill:  0    Order Specific Question:   Supervising Provider    Answer:   Quentin Angst [id number]  . traZODone (DESYREL) 50 MG tablet    Sig: Take 0.5-1 tablets (25-50 mg total) by mouth at bedtime as needed for sleep.    Dispense:  30 tablet    Refill:  1    Order Specific Question:   Supervising Provider    Answer:   Quentin Angst [id number]  . lactulose (CEPHULAC) 20 g packet    Sig: Take 1 packet (20 g total) by mouth 3 (three) times daily as needed.    Dispense:  30 each    Refill:  0    Order Specific Question:   Supervising Provider    Answer:   Quentin Angst [id number]  . DISCONTD: ketorolac (TORADOL) 30 MG/ML injection 30 mg  . ketorolac (TORADOL) 30 MG/ML injection 30 mg    Orders Placed This Encounter  Procedures  . POCT urine pregnancy  . POCT urinalysis dip (device)    02/26/2017-Sickle Cell Management, PAP,  evaluate vascular access for possible replacement.  The patient was given clear instructions to go to ER or return  to medical center if symptoms don't improve, worsen or new problems develop. The patient verbalized understanding. The patient was told to call to get lab results if they haven't heard anything in the next week.    -A total of 25 minutes spent, greater than 50 % of this time was spent reviewing prior medical history, reviewing medications and indications of treatment, prior labs and diagnostic tests, discussing current plan of treatment, health promotion, and goals of treatment.   This note has been created with Teaching laboratory technician and smart  Lobbyistphrase technology. Any transcriptional errors are unintentional.

## 2017-01-28 ENCOUNTER — Encounter (HOSPITAL_COMMUNITY): Payer: Self-pay | Admitting: *Deleted

## 2017-01-28 ENCOUNTER — Non-Acute Institutional Stay (HOSPITAL_COMMUNITY)
Admission: AD | Admit: 2017-01-28 | Discharge: 2017-01-28 | Disposition: A | Discharge status: Home / Self Care | Payer: Medicaid Other | Source: Ambulatory Visit | Attending: Internal Medicine | Admitting: Internal Medicine

## 2017-01-28 ENCOUNTER — Telehealth (HOSPITAL_COMMUNITY): Payer: Self-pay

## 2017-01-28 DIAGNOSIS — Z79899 Other long term (current) drug therapy: Secondary | ICD-10-CM | POA: Insufficient documentation

## 2017-01-28 DIAGNOSIS — Z885 Allergy status to narcotic agent status: Secondary | ICD-10-CM | POA: Insufficient documentation

## 2017-01-28 DIAGNOSIS — D5700 Hb-SS disease with crisis, unspecified: Secondary | ICD-10-CM | POA: Insufficient documentation

## 2017-01-28 DIAGNOSIS — F17290 Nicotine dependence, other tobacco product, uncomplicated: Secondary | ICD-10-CM | POA: Insufficient documentation

## 2017-01-28 DIAGNOSIS — Z888 Allergy status to other drugs, medicaments and biological substances status: Secondary | ICD-10-CM | POA: Insufficient documentation

## 2017-01-28 DIAGNOSIS — Z791 Long term (current) use of non-steroidal anti-inflammatories (NSAID): Secondary | ICD-10-CM | POA: Diagnosis not present

## 2017-01-28 LAB — COMPREHENSIVE METABOLIC PANEL
ALBUMIN: 3.9 g/dL (ref 3.5–5.0)
ALK PHOS: 100 U/L (ref 38–126)
ALT: 27 U/L (ref 14–54)
ANION GAP: 5 (ref 5–15)
AST: 48 U/L — ABNORMAL HIGH (ref 15–41)
BILIRUBIN TOTAL: 2.2 mg/dL — AB (ref 0.3–1.2)
BUN: 9 mg/dL (ref 6–20)
CALCIUM: 8.6 mg/dL — AB (ref 8.9–10.3)
CO2: 21 mmol/L — AB (ref 22–32)
Chloride: 111 mmol/L (ref 101–111)
Creatinine, Ser: 0.44 mg/dL (ref 0.44–1.00)
GFR calc Af Amer: >60 mL/min (ref >60)
GFR calc non Af Amer: >60 mL/min (ref >60)
GLUCOSE: 104 mg/dL — AB (ref 65–99)
Potassium: 3 mmol/L — ABNORMAL LOW (ref 3.5–5.1)
SODIUM: 137 mmol/L (ref 135–145)
TOTAL PROTEIN: 7 g/dL (ref 6.5–8.1)

## 2017-01-28 LAB — RETICULOCYTES
RBC.: 2.17 MIL/uL — ABNORMAL LOW (ref 3.87–5.11)
Retic Count, Absolute: 299.5 10*3/uL — ABNORMAL HIGH (ref 19.0–186.0)
Retic Ct Pct: 13.8 % — ABNORMAL HIGH (ref 0.4–3.1)

## 2017-01-28 LAB — CBC WITH DIFFERENTIAL/PLATELET
BASOS PCT: 0 %
Basophils Absolute: 0 10*3/uL (ref 0.0–0.1)
Eosinophils Absolute: 0.1 10*3/uL (ref 0.0–0.7)
Eosinophils Relative: 1 %
HCT: 21.6 % — ABNORMAL LOW (ref 36.0–46.0)
HEMOGLOBIN: 7.7 g/dL — AB (ref 12.0–15.0)
LYMPHS PCT: 36 %
Lymphs Abs: 4.8 10*3/uL — ABNORMAL HIGH (ref 0.7–4.0)
MCH: 35.5 pg — AB (ref 26.0–34.0)
MCHC: 35.6 g/dL (ref 30.0–36.0)
MCV: 99.5 fL (ref 78.0–100.0)
MONO ABS: 1.1 10*3/uL — AB (ref 0.1–1.0)
MONOS PCT: 8 %
NEUTROS ABS: 7.3 10*3/uL (ref 1.7–7.7)
NRBC: 1 /100{WBCs} — AB
Neutrophils Relative %: 55 %
Platelets: 322 10*3/uL (ref 150–400)
RBC: 2.17 MIL/uL — ABNORMAL LOW (ref 3.87–5.11)
RDW: 22.2 % — ABNORMAL HIGH (ref 11.5–15.5)
WBC: 13.3 10*3/uL — ABNORMAL HIGH (ref 4.0–10.5)

## 2017-01-28 MED ORDER — HYDROMORPHONE 1 MG/ML IV SOLN
INTRAVENOUS | Status: DC
  Administered 2017-01-28: 10:00:00 via INTRAVENOUS
  Administered 2017-01-28: 16 mg via INTRAVENOUS
  Filled 2017-01-28: qty 25

## 2017-01-28 MED ORDER — POTASSIUM CHLORIDE CRYS ER 20 MEQ PO TBCR
40.0000 meq | EXTENDED_RELEASE_TABLET | Freq: Once | ORAL | Status: AC
  Administered 2017-01-28: 40 meq via ORAL
  Filled 2017-01-28: qty 2

## 2017-01-28 MED ORDER — DIPHENHYDRAMINE HCL 12.5 MG/5ML PO ELIX
25.0000 mg | ORAL_SOLUTION | Freq: Once | ORAL | Status: AC | PRN
  Administered 2017-01-28: 25 mg via ORAL

## 2017-01-28 MED ORDER — KETOROLAC TROMETHAMINE 30 MG/ML IJ SOLN
15.0000 mg | Freq: Once | INTRAMUSCULAR | Status: AC
  Administered 2017-01-28: 15 mg via INTRAVENOUS
  Filled 2017-01-28: qty 1

## 2017-01-28 MED ORDER — SODIUM CHLORIDE 0.9 % IV SOLN
25.0000 mg | INTRAVENOUS | Status: DC | PRN
  Filled 2017-01-28: qty 0.5

## 2017-01-28 MED ORDER — SODIUM CHLORIDE 0.9% FLUSH: 10.0000 mL | INTRAVENOUS | Status: DC | PRN

## 2017-01-28 MED ORDER — HEPARIN SOD (PORK) LOCK FLUSH 100 UNIT/ML IV SOLN
250.0000 [IU] | INTRAVENOUS | Status: AC | PRN
  Administered 2017-01-28: 250 [IU]

## 2017-01-28 MED ORDER — SODIUM CHLORIDE 0.9% FLUSH: 9.0000 mL | INTRAVENOUS | Status: DC | PRN

## 2017-01-28 MED ORDER — DIPHENHYDRAMINE HCL 25 MG PO CAPS: 25.0000 mg | ORAL_CAPSULE | ORAL | Status: DC | PRN

## 2017-01-28 MED ORDER — DEXTROSE-NACL 5-0.45 % IV SOLN
INTRAVENOUS | Status: DC
  Administered 2017-01-28: 10:00:00 via INTRAVENOUS

## 2017-01-28 MED ORDER — DIPHENHYDRAMINE HCL 12.5 MG/5ML PO ELIX
25.0000 mg | ORAL_SOLUTION | Freq: Once | ORAL | Status: AC
  Administered 2017-01-28: 25 mg via ORAL
  Filled 2017-01-28: qty 10

## 2017-01-28 MED ORDER — HEPARIN SOD (PORK) LOCK FLUSH 100 UNIT/ML IV SOLN: 250.0000 [IU] | INTRAVENOUS | Status: DC | PRN

## 2017-01-28 MED ORDER — NALOXONE HCL 0.4 MG/ML IJ SOLN: 0.4000 mg | INTRAMUSCULAR | Status: DC | PRN

## 2017-01-28 NOTE — Progress Notes (Signed)
Patient presented to day hospital for treatment of sickle cell pain rated 8 out of 10.  Treatment provided with IV Dilaudid PCA - patient tolerated well and was discharged with a level 6 out of 10 pain.  Potasium treated with PO K-Dur.  Labs were not redrawn prior to patient leaving.  Spoke with patient via telephone and she will return to day hospital tomorrow for potassium level check. Ambulatory at time of discharge.

## 2017-01-28 NOTE — Progress Notes (Signed)
Amanda pain, a 31 year old female with a history of sickle cell anemia, hemoglobin SS presented to the day infusion center for pain management and extended observation.  Patient was found to have a potassium level of 3.0.  Will treat with K-Dur 40 mEq x1.  Will recheck potassium level prior to discharge   Nolon NationsLaChina Moore Takyah Ciaramitaro  MSN, FNP-C Patient Care [facility] [address]  [place], [place] [id number] [phone]

## 2017-01-28 NOTE — Discharge Instructions (Signed)
Sickle Cell Anemia, Adult °Sickle cell anemia is a condition where your red blood cells are shaped like sickles. Red blood cells carry oxygen through the body. Sickle-shaped red blood cells do not live as long as normal red blood cells. They also clump together and block blood from flowing through the blood vessels. These things prevent the body from getting enough oxygen. Sickle cell anemia causes organ damage and pain. It also increases the risk of infection. °Follow these instructions at home: °· Drink enough fluid to keep your pee (urine) clear or pale yellow. Drink more in hot weather and during exercise. °· Do not smoke. Smoking lowers oxygen levels in the blood. °· Only take over-the-counter or prescription medicines as told by your doctor. °· Take antibiotic medicines as told by your doctor. Make sure you finish them even if you start to feel better. °· Take supplements as told by your doctor. °· Consider wearing a medical alert bracelet. This tells anyone caring for you in an emergency of your condition. °· When traveling, keep your medical information, doctors' names, and the medicines you take with you at all times. °· If you have a fever, do not take fever medicines right away. This could cover up a problem. Tell your doctor. °· Keep all follow-up visits with your doctor. Sickle cell anemia requires regular medical care. °Contact a doctor if: °You have a fever. °Get help right away if: °· You feel dizzy or faint. °· You have new belly (abdominal) pain, especially on the left side near the stomach area. °· You have a lasting, often uncomfortable and painful erection of the penis (priapism). If it is not treated right away, you will become unable to have sex (impotence). °· You have numbness in your arms or legs or you have a hard time moving them. °· You have a hard time talking. °· You have a fever or lasting symptoms for more than 2-3 days. °· You have a fever and your symptoms suddenly get  worse. °· You have signs or symptoms of infection. These include: °? Chills. °? Being more tired than normal (lethargy). °? Irritability. °? Poor eating. °? Throwing up (vomiting). °· You have pain that is not helped with medicine. °· You have shortness of breath. °· You have pain in your chest. °· You are coughing up pus-like or bloody mucus. °· You have a stiff neck. °· Your feet or hands swell or have pain. °· Your belly looks bloated. °· Your joints hurt. °This information is not intended to replace advice given to you by your health care provider. Make sure you discuss any questions you have with your health care provider. °Document Released: 12/07/2012 Document Revised: 07/25/2015 Document Reviewed: 09/28/2012 °Elsevier Interactive Patient Education © 2017 [employer] ° °

## 2017-01-28 NOTE — Telephone Encounter (Signed)
[facility] Complaint of pain in hips and ribs rated 8 out of 10.  Denies fever, chest pain, vomiting, diarrhea, or abdominal pain.  Spoke with Julianne HandlerLaChina [name], FNP-C who granted permission to come for treatment.  Called patient back at 0822 to inform of same.

## 2017-01-28 NOTE — H&P (Signed)
[facility] History and Physical   Date: 01/28/2017  Patient name: Brittany Foster Medical record number: [id number] Date of birth: 1985-09-10 Age: 31 y.o. Gender: female PCP: Bing Neighbors, FNP  Attending physician: Quentin Angst, MD  Chief Complaint: Generalized pain  History of Present Illness: Brittany Foster, 31 year old female patient with a history of sickle cell anemia, HbSS presents complaining of generalized pain that is consistent with sickle cell anemia. Patient has has frequent emergency room visits over the past several months. Pain intensity is current 8/10 described as constant. She say that she last had Percocet 5-325 mg around 8 am without sustained relief. Patient denies fatigue, shortness of breath, chest pain, nausea, vomiting, or diarrhea.   Meds: Medications Prior to Admission  Medication Sig Dispense Refill Last Dose  . ARIPiprazole (ABILIFY) 10 MG tablet Take 10 mg by mouth daily  3 Taking  . DULoxetine (CYMBALTA) 60 MG capsule TAKE 1 CAPSULE BY MOUTH DAILY. 30 capsule 3 Taking  . folic acid (FOLVITE) 1 MG tablet Take 1 tablet (1 mg total) by mouth daily. 90 tablet 3 Taking  . hydroxyurea (HYDREA) 500 MG capsule TAKE 2 CAPSULES BY MOUTH DAILY. MAY TAKE WITH FOOD TO MINIMIZE GI SIDE EFFECTS. (Patient taking differently: Take 1,500 mg by mouth daily. ) 180 capsule 3 Taking  . ibuprofen (ADVIL,MOTRIN) 200 MG tablet Take 400 mg by mouth every 6 (six) hours as needed.   Taking  . lactulose (CEPHULAC) 20 g packet Take 1 packet (20 g total) by mouth 3 (three) times daily as needed. 30 each 0   . medroxyPROGESTERone (DEPO-PROVERA) 150 MG/ML injection Inject 150 mg into the muscle every 3 (three) months.    Taking  . methocarbamol (ROBAXIN) 500 MG tablet Take 1 tablet (500 mg total) by mouth 4 (four) times daily. 120 tablet 0   . Topiramate ER 100 MG CP24 Take 200 mg by mouth at bedtime. (Patient taking differently: Take 100 mg by mouth at bedtime. ) 60  capsule 3 Taking  . traZODone (DESYREL) 50 MG tablet Take 0.5-1 tablets (25-50 mg total) by mouth at bedtime as needed for sleep. 30 tablet 1     Allergies: Ultram [tramadol]; Zofran [ondansetron hcl]; Buprenorphine hcl; Fentanyl; Morphine and related; and Tape Past Medical History:  Diagnosis Date  . Anemia   . Depression, major, recurrent (HCC)   . Migraines   . Sickle cell anemia (HCC)    Past Surgical History:  Procedure Laterality Date  . CESAREAN SECTION    . CHOLECYSTECTOMY  2000  . IR GENERIC HISTORICAL  10/08/2015   IR US GUIDE VASC ACCESS RIGHT 10/08/2015 Simonne Come, MD [facility]INTER[facility]  . IR GENERIC HISTORICAL  10/08/2015   IR FLUORO GUIDE CV LINE RIGHT 10/08/2015 Simonne Come, MD [facility]  . MULTIPLE TOOTH EXTRACTIONS N/A   . port a cath placement Right    about 6-7 years ago  . removal of porta cath Right 09/11/15  . TUBAL LIGATION     Family History  Problem Relation Age of Onset  . Sickle cell trait Father   . Sickle cell trait Mother   . Sickle cell anemia Other    Social History   Socioeconomic History  . Marital status: Single    Spouse name: Not on file  . Number of children: Not on file  . Years of education: Not on file  . Highest education level: Not on file  Social Needs  . Financial resource strain:  Not on file  . Food insecurity - worry: Not on file  . Food insecurity - inability: Not on file  . Transportation needs - medical: Not on file  . Transportation needs - non-medical: Not on file  Occupational History  . Occupation: None  Tobacco Use  . Smoking status: Current Some Day Smoker    Packs/day: 1.00  . Smokeless tobacco: Current User  Substance and Sexual Activity  . Alcohol use: No    Alcohol/week: 0.0 oz  . Drug use: No  . Sexual activity: Yes    Partners: Male    Birth control/protection: Injection    Comment: 1 new partner recently, no concern about STI, uses condoms  Other Topics Concern  . Not on file  Social History Narrative    32 year old daughter    Review of Systems  Constitutional: Negative.   HENT: Negative.   Eyes: Negative.   Respiratory: Negative.   Cardiovascular: Negative.   Gastrointestinal: Negative for abdominal pain and diarrhea.  Genitourinary: Negative.   Musculoskeletal: Positive for back pain, joint pain and myalgias.  Skin: Negative.   Neurological: Negative.  Negative for dizziness and headaches.  Psychiatric/Behavioral: Negative.      Physical Exam: Blood pressure 102/64, pulse 80, temperature 98.6 F (37 C), temperature source Oral, resp. rate 16, SpO2 100 %. BP 112/63 (BP Location: Left Arm)   Pulse 82   Temp 98.6 F (37 C) (Oral)   Resp 15   SpO2 100%   General Appearance:    Alert, cooperative, no distress, appears stated age  Head:    Normocephalic, without obvious abnormality, atraumatic  Eyes:    PERRL, conjunctiva/corneas clear, EOM's intact, fundi    benign, both eyes  Ears:    Normal TM's and external ear canals, both ears  Nose:   Nares normal, septum midline, mucosa normal, no drainage    or sinus tenderness  Throat:   Lips, mucosa, and tongue normal; teeth and gums normal  Neck:   Supple, symmetrical, trachea midline, no adenopathy;    thyroid:  no enlargement/tenderness/nodules; no carotid   bruit or JVD  Back:     Symmetric, no curvature, ROM normal, no CVA tenderness  Lungs:     Clear to auscultation bilaterally, respirations unlabored  Chest Wall:    No tenderness or deformity   Heart:    Regular rate and rhythm, S1 and S2 normal, no murmur, rub   or gallop  Abdomen:     Soft, non-tender, bowel sounds active all four quadrants,    no masses, no organomegaly  Extremities:   Extremities normal, atraumatic, no cyanosis or edema  Pulses:   2+ and symmetric all extremities  Skin:   Skin color, texture, turgor normal, no rashes or lesions  Lymph nodes:   Cervical, supraclavicular, and axillary nodes normal  Neurologic:   CNII-XII intact, normal strength,  sensation and reflexes    throughout    Lab results: Results for orders placed or performed in visit on 01/27/17 (from the past 24 hour(s))  POCT urinalysis dip (device)     Status: None   Collection Time: 01/27/17 10:54 AM  Result Value Ref Range   Glucose, UA NEGATIVE NEGATIVE mg/dL   Bilirubin Urine NEGATIVE NEGATIVE   Ketones, ur NEGATIVE NEGATIVE mg/dL   Specific Gravity, Urine 1.015 1.005 - 1.030   Hgb urine dipstick NEGATIVE NEGATIVE   pH 6.0 5.0 - 8.0   Protein, ur NEGATIVE NEGATIVE mg/dL   Urobilinogen, UA 1.0  0.0 - 1.0 mg/dL   Nitrite NEGATIVE NEGATIVE   Leukocytes, UA NEGATIVE NEGATIVE  POCT urine pregnancy     Status: Normal   Collection Time: 01/27/17 11:08 AM  Result Value Ref Range   Preg Test, Ur Negative Negative    Imaging results:  No results found.   Assessment & Plan:  Patient will be admitted to the day infusion center for extended observation  Start IV D5.45 for cellular rehydration at 75/hr  Start Toradol 30 mg IV every 6 hours for inflammation.  Start Dilaudid PCA High Concentration per weight based protocol.   Patient will be re-evaluated for pain intensity in the context of function and relationship to baseline as care progresses.  If no significant pain relief, will transfer patient to inpatient services for a higher level of care.   Will check CMP, reticulocytes and CBC w/differential   Tarvares Lant M 01/28/2017, 9:36 AM

## 2017-01-28 NOTE — Discharge Summary (Signed)
[facility] Discharge Summary   Patient ID: Brittany CampbellMiranda Rovner MRN: [id number] DOB/AGE: 32/03/1985 32 y.o.  Admit date: 01/28/2017 Discharge date: 01/28/2017  Primary Care Physician:  Bing NeighborsHarris, Kimberly S, FNP  Admission Diagnoses:  Active Problems:   Sickle cell crisis [facility](HCC)   Discharge Medications:  Allergies as of 01/28/2017      Reactions   Ultram [tramadol] Other (See Comments)   Reaction:  Seizures    Zofran [ondansetron Hcl] Nausea And Vomiting   Buprenorphine Hcl Hives, Other (See Comments)   Reaction:  Shaking    Fentanyl Hives   Morphine And Related Hives, Other (See Comments)   Reaction:  Shaking    Tape Rash      Medication List    TAKE these medications   ARIPiprazole 10 MG tablet Commonly known as:  ABILIFY Take 10 mg by mouth daily   DULoxetine 60 MG capsule Commonly known as:  CYMBALTA TAKE 1 CAPSULE BY MOUTH DAILY.   folic acid 1 MG tablet Commonly known as:  FOLVITE Take 1 tablet (1 mg total) by mouth daily.   hydroxyurea 500 MG capsule Commonly known as:  HYDREA TAKE 2 CAPSULES BY MOUTH DAILY. MAY TAKE WITH FOOD TO MINIMIZE GI SIDE EFFECTS. What changed:    how much to take  how to take this  when to take this  additional instructions   ibuprofen 200 MG tablet Commonly known as:  ADVIL,MOTRIN Take 400 mg by mouth every 6 (six) hours as needed.   lactulose 20 g packet Commonly known as:  CEPHULAC Take 1 packet (20 g total) by mouth 3 (three) times daily as needed.   medroxyPROGESTERone 150 MG/ML injection Commonly known as:  DEPO-PROVERA Inject 150 mg into the muscle every 3 (three) months.   methocarbamol 500 MG tablet Commonly known as:  ROBAXIN Take 1 tablet (500 mg total) by mouth 4 (four) times daily.   Topiramate ER 100 MG Cp24 Commonly known as:  TROKENDI XR Take 200 mg by mouth at bedtime. What changed:  how much to take   traZODone 50 MG tablet Commonly known as:  DESYREL Take 0.5-1 tablets (25-50 mg  total) by mouth at bedtime as needed for sleep.        Consults:  None  Significant Diagnostic Studies:  No results found.   [facility] Course: Brittany Foster, 31 year old female patient with a history of sickle cell anemia, HbSS presents complaining of generalized pain that is consistent with sickle cell anemia. Patient has has frequent emergency room visits over the past several months.  Pain intensity 8 out of 10 on admission.  Patient was admitted to the day infusion center for pain management and extended observation.  Review laboratory values, potassium was 3.0.  Given K-Dur 40 mEq x1.  Stat potassium ordered for review.  All other laboratory values consistent with previous. Hypotonic IV fluids started for cellular rehydration.  Started Toradol 15 mg IV x1 for inflammation.  Dilaudid PCA started per high concentration weight-based protocol.  Patient used a total of 16 mg with 32 demands and 30 deliveries.  Pain intensity decreased to 4/10.  Patient discharged home in stable condition.  Patient is alert oriented and ambulating.  Discharge instructions: Resume all home medications Follow-up with PCP as previously scheduled The patient was given clear instructions to go to ER or return to medical center if symptoms do not improve, worsen or new problems develop. The patient verbalized understanding.     Physical  Exam at Discharge:  BP 112/63 (BP Location: Left Arm)   Pulse 82   Temp 98.6 F (37 C) (Oral)   Resp 15   SpO2 100%    General Appearance:    Alert, cooperative, no distress, appears stated age  Head:    Normocephalic, without obvious abnormality, atraumatic  Neck:   Supple, symmetrical, trachea midline, no adenopathy;    thyroid:  no enlargement/tenderness/nodules; no carotid   bruit or JVD  Back:     Symmetric, no curvature, ROM normal, no CVA tenderness  Lungs:     Clear to auscultation bilaterally, respirations unlabored  Chest Wall:    No  tenderness or deformity   Heart:    Regular rate and rhythm, S1 and S2 normal, no murmur, rub   or gallop  Abdomen:     Soft, non-tender, bowel sounds active all four quadrants,    no masses, no organomegaly  Extremities:   Extremities normal, atraumatic, no cyanosis or edema  Pulses:   2+ and symmetric all extremities  Skin:   Skin color, texture, turgor normal, no rashes or lesions  Lymph nodes:   Cervical, supraclavicular, and axillary nodes normal  Neurologic:   CNII-XII intact, normal strength, sensation and reflexes    throughout    Disposition at Discharge: 01-Home or Self Care  Discharge Orders: Discharge Instructions    Discharge patient   Complete by:  As directed    Discharge disposition:  01-Home or Self Care   Discharge patient date:  01/28/2017      Condition at Discharge:   Stable  Time spent on Discharge:  Greater than 30 minutes.  Signed: [name],Lachina M 01/28/2017, 3:12 PM

## 2017-02-02 ENCOUNTER — Telehealth (HOSPITAL_COMMUNITY): Payer: Self-pay | Admitting: *Deleted

## 2017-02-02 ENCOUNTER — Telehealth: Payer: Self-pay | Admitting: Family Medicine

## 2017-02-02 NOTE — Telephone Encounter (Signed)
Phoned Nadie and provided her with the cell number for Thom ChimesMarsha Cullins, MSW, LCSWA. Mindi JunkerMarsha has been attempting to reach out to her to discuss substance abuse rehabilitation programs. She was provided the number and stated the she will follow-up.   Godfrey PickKimberly S. Tiburcio PeaHarris, MSN, FNP-C [facility]  [address] [place], [place] [address] [phone]

## 2017-02-02 NOTE — Telephone Encounter (Signed)
[facility]. Patient c/o a 7 out of 10 hips, legs, and lower back pain on a 1-10 pain scale relating to [facility]. Patient denies fevers, chest pain, nausea, vomiting, diarrhea, and abdominal pain. Patient verbalized she last took her oxycodone at 0700 and her muscle relaxer at 0800.   Discussed with provider. Per provider, patient educated to continue to take her medications as prescribed around the clock, hydrate, and rest. Patient is to call back tomorrow for triage. Educated to seek medical attention if experiencing SOB, chest pain, or trouble breathing. Patient verbalized understanding.

## 2017-02-03 ENCOUNTER — Telehealth (HOSPITAL_COMMUNITY): Payer: Self-pay | Admitting: *Deleted

## 2017-02-03 NOTE — Telephone Encounter (Signed)
Patient called complaining of pain in back and bilateral legs rated 8/10. Patient reports last taking Oxycodone and a muscle relaxer at 6:00 am.  Patient denies fever, chest pain, nausea or vomiting. Patient does report some chronic upper abdominal pain. Armeniahina, FNP notified and advised patient to stay home, hydrate, continue to take prescribed medications and keep scheduled doctors appointment. Patient advised of provider's recommendation. Patient expresses an understanding.

## 2017-02-10 ENCOUNTER — Telehealth (HOSPITAL_COMMUNITY): Payer: Self-pay | Admitting: *Deleted

## 2017-02-10 NOTE — Telephone Encounter (Signed)
Patient called requesting to be treated in the day hospital. Patient reports pain in legs, hips and back rated 7/10.  Patient reports last taking Oxycodone at 10:30 am and a muscle relaxer at 7:00 am.  Patient reports some chronic upper abdominal pain. Selena BattenKim, FNP notified and advised patient to stay home, hydrate and continue to take prescribed medications.  Instructions given to patient.

## 2017-02-15 ENCOUNTER — Telehealth (HOSPITAL_COMMUNITY): Payer: Self-pay | Admitting: *Deleted

## 2017-02-15 NOTE — Telephone Encounter (Signed)
Patient called back at this time and advised to continue to take her home medications around the clock, rest, and hydrate. If patient experiences any abdominal pain or chest pain patient advised to go to the ER per provider. Patient advised if she still feels bad in the morning [facility] provider. Patient states an understanding.

## 2017-02-15 NOTE — Telephone Encounter (Signed)
[facility] called [facility] c/o bilateral leg, hip and back pain. Patient rates pain a 7/10 on pain scale. Patient denies fever, chest pain, vomiting, diarrhea or abdominal pain. Patient reports that she is nauseous. Patient last took her home medication Oxycodone around 8 am with no relief. Patient told to expect a return phone call once provider is notified.

## 2017-02-16 ENCOUNTER — Non-Acute Institutional Stay (HOSPITAL_BASED_OUTPATIENT_CLINIC_OR_DEPARTMENT_OTHER)
Admission: AD | Admit: 2017-02-16 | Discharge: 2017-02-16 | Disposition: A | Discharge status: Home / Self Care | Payer: Medicaid Other | Source: Ambulatory Visit | Attending: Internal Medicine | Admitting: Internal Medicine

## 2017-02-16 ENCOUNTER — Telehealth (HOSPITAL_COMMUNITY): Payer: Self-pay | Admitting: *Deleted

## 2017-02-16 ENCOUNTER — Other Ambulatory Visit: Payer: Self-pay | Admitting: Family Medicine

## 2017-02-16 DIAGNOSIS — D72829 Elevated white blood cell count, unspecified: Secondary | ICD-10-CM | POA: Insufficient documentation

## 2017-02-16 DIAGNOSIS — Z79899 Other long term (current) drug therapy: Secondary | ICD-10-CM | POA: Insufficient documentation

## 2017-02-16 DIAGNOSIS — E876 Hypokalemia: Secondary | ICD-10-CM | POA: Insufficient documentation

## 2017-02-16 DIAGNOSIS — F17210 Nicotine dependence, cigarettes, uncomplicated: Secondary | ICD-10-CM | POA: Insufficient documentation

## 2017-02-16 DIAGNOSIS — R509 Fever, unspecified: Secondary | ICD-10-CM

## 2017-02-16 DIAGNOSIS — D5700 Hb-SS disease with crisis, unspecified: Secondary | ICD-10-CM | POA: Diagnosis present

## 2017-02-16 LAB — RETICULOCYTES
RBC.: 2.39 MIL/uL — AB (ref 3.87–5.11)
Retic Count, Absolute: 358.5 10*3/uL — ABNORMAL HIGH (ref 19.0–186.0)
Retic Ct Pct: 15 % — ABNORMAL HIGH (ref 0.4–3.1)

## 2017-02-16 LAB — COMPREHENSIVE METABOLIC PANEL
ALK PHOS: 93 U/L (ref 38–126)
ALT: 28 U/L (ref 14–54)
AST: 47 U/L — AB (ref 15–41)
Albumin: 3.9 g/dL (ref 3.5–5.0)
Anion gap: 7 (ref 5–15)
BUN: 5 mg/dL — AB (ref 6–20)
CALCIUM: 8.6 mg/dL — AB (ref 8.9–10.3)
CHLORIDE: 111 mmol/L (ref 101–111)
CO2: 20 mmol/L — AB (ref 22–32)
CREATININE: 0.36 mg/dL — AB (ref 0.44–1.00)
Glucose, Bld: 123 mg/dL — ABNORMAL HIGH (ref 65–99)
Potassium: 2.7 mmol/L — CL (ref 3.5–5.1)
SODIUM: 138 mmol/L (ref 135–145)
Total Bilirubin: 2.7 mg/dL — ABNORMAL HIGH (ref 0.3–1.2)
Total Protein: 7.1 g/dL (ref 6.5–8.1)

## 2017-02-16 LAB — CBC WITH DIFFERENTIAL/PLATELET
BASOS ABS: 0 10*3/uL (ref 0.0–0.1)
Basophils Relative: 0 %
Eosinophils Absolute: 0.1 10*3/uL (ref 0.0–0.7)
Eosinophils Relative: 0 %
HCT: 23.2 % — ABNORMAL LOW (ref 36.0–46.0)
HEMOGLOBIN: 8.3 g/dL — AB (ref 12.0–15.0)
LYMPHS ABS: 3.1 10*3/uL (ref 0.7–4.0)
LYMPHS PCT: 20 %
MCH: 34.7 pg — AB (ref 26.0–34.0)
MCHC: 35.8 g/dL (ref 30.0–36.0)
MCV: 97.1 fL (ref 78.0–100.0)
Monocytes Absolute: 1.1 10*3/uL — ABNORMAL HIGH (ref 0.1–1.0)
Monocytes Relative: 7 %
NEUTROS PCT: 73 %
Neutro Abs: 11.7 10*3/uL — ABNORMAL HIGH (ref 1.7–7.7)
PLATELETS: 329 10*3/uL (ref 150–400)
RBC: 2.39 MIL/uL — AB (ref 3.87–5.11)
RDW: 20.9 % — ABNORMAL HIGH (ref 11.5–15.5)
WBC: 16 10*3/uL — AB (ref 4.0–10.5)

## 2017-02-16 MED ORDER — ACETAMINOPHEN 325 MG PO TABS
650.0000 mg | ORAL_TABLET | Freq: Once | ORAL | Status: AC
  Administered 2017-02-16: 650 mg via ORAL
  Filled 2017-02-16: qty 2

## 2017-02-16 MED ORDER — ONDANSETRON HCL 4 MG/2ML IJ SOLN: 4.0000 mg | Freq: Four times a day (QID) | INTRAMUSCULAR | Status: DC | PRN

## 2017-02-16 MED ORDER — NALOXONE HCL 0.4 MG/ML IJ SOLN
0.4000 mg | INTRAMUSCULAR | Status: DC | PRN
Start: 2017-02-16 — End: 2017-02-16

## 2017-02-16 MED ORDER — HYDROMORPHONE 1 MG/ML IV SOLN
INTRAVENOUS | Status: DC
  Administered 2017-02-16: 11:00:00 via INTRAVENOUS
  Filled 2017-02-16: qty 30

## 2017-02-16 MED ORDER — HYDROMORPHONE 1 MG/ML IV SOLN
INTRAVENOUS | Status: DC
  Administered 2017-02-16: 10.9 mg via INTRAVENOUS

## 2017-02-16 MED ORDER — DEXTROSE-NACL 5-0.45 % IV SOLN
INTRAVENOUS | Status: DC
  Administered 2017-02-16: 11:00:00 via INTRAVENOUS

## 2017-02-16 MED ORDER — POTASSIUM CHLORIDE CRYS ER 20 MEQ PO TBCR: 20.0000 meq | EXTENDED_RELEASE_TABLET | Freq: Every day | ORAL | 0 refills | Status: DC

## 2017-02-16 MED ORDER — SODIUM CHLORIDE 0.9% FLUSH: 9.0000 mL | INTRAVENOUS | Status: DC | PRN

## 2017-02-16 MED ORDER — OXYCODONE HCL 5 MG PO TABS
10.0000 mg | ORAL_TABLET | Freq: Once | ORAL | Status: AC
  Administered 2017-02-16: 10 mg via ORAL
  Filled 2017-02-16: qty 2

## 2017-02-16 MED ORDER — HEPARIN SOD (PORK) LOCK FLUSH 100 UNIT/ML IV SOLN
250.0000 [IU] | INTRAVENOUS | Status: AC | PRN
  Administered 2017-02-16: 250 [IU]
  Filled 2017-02-16: qty 5

## 2017-02-16 MED ORDER — HEPARIN SOD (PORK) LOCK FLUSH 100 UNIT/ML IV SOLN: 250.0000 [IU] | INTRAVENOUS | Status: DC | PRN

## 2017-02-16 MED ORDER — POTASSIUM CHLORIDE CRYS ER 20 MEQ PO TBCR
40.0000 meq | EXTENDED_RELEASE_TABLET | Freq: Once | ORAL | Status: AC
  Administered 2017-02-16: 40 meq via ORAL
  Filled 2017-02-16: qty 2

## 2017-02-16 MED ORDER — SODIUM CHLORIDE 0.9% FLUSH: 10.0000 mL | INTRAVENOUS | Status: DC | PRN

## 2017-02-16 MED ORDER — DIPHENHYDRAMINE HCL 25 MG PO CAPS: 25.0000 mg | ORAL_CAPSULE | ORAL | Status: DC | PRN

## 2017-02-16 MED ORDER — KETOROLAC TROMETHAMINE 30 MG/ML IJ SOLN
30.0000 mg | Freq: Once | INTRAMUSCULAR | Status: AC
  Administered 2017-02-16: 30 mg via INTRAVENOUS
  Filled 2017-02-16: qty 1

## 2017-02-16 MED ORDER — DIPHENHYDRAMINE HCL 50 MG/ML IJ SOLN
25.0000 mg | INTRAMUSCULAR | Status: DC | PRN
  Filled 2017-02-16: qty 0.5

## 2017-02-16 NOTE — Discharge Summary (Signed)
[facility] Discharge Summary   Patient ID: Brittany Foster MRN: [id number] DOB/AGE: 32/03/1985 32 y.o.  Admit date: 02/16/2017 Discharge date: 02/16/2017  Primary Care Physician:  Brittany Foster, Deloma Spindle S, FNP  Admission Diagnoses:  SICKLE CELLCRISIS PAIN   Discharge Diagnoses:   Sickle Cell Crisis Pain Leukocytosis Hypokalemia  Low grade fever   Discharge Medications: Current Outpatient Medications:  .  ARIPiprazole (ABILIFY) 10 MG tablet, Take 10 mg by mouth daily, Disp: , Rfl: 3 .  DULoxetine (CYMBALTA) 60 MG capsule, TAKE 1 CAPSULE BY MOUTH DAILY., Disp: 30 capsule, Rfl: 3 .  folic acid (FOLVITE) 1 MG tablet, Take 1 tablet (1 mg total) by mouth daily., Disp: 90 tablet, Rfl: 3 .  hydroxyurea (HYDREA) 500 MG capsule, TAKE 2 CAPSULES BY MOUTH DAILY. MAY TAKE WITH FOOD TO MINIMIZE GI SIDE EFFECTS. (Patient taking differently: Take 1,500 mg by mouth daily. ), Disp: 180 capsule, Rfl: 3 .  ibuprofen (ADVIL,MOTRIN) 200 MG tablet, Take 400 mg by mouth every 6 (six) hours as needed., Disp: , Rfl:  .  lactulose (CEPHULAC) 20 g packet, Take 1 packet (20 g total) by mouth 3 (three) times daily as needed., Disp: 30 each, Rfl: 0 .  medroxyPROGESTERone (DEPO-PROVERA) 150 MG/ML injection, Inject 150 mg into the muscle every 3 (three) months. , Disp: , Rfl:  .  methocarbamol (ROBAXIN) 500 MG tablet, Take 1 tablet (500 mg total) by mouth 4 (four) times daily., Disp: 120 tablet, Rfl: 0 .  potassium chloride SA (K-DUR,KLOR-CON) 20 MEQ tablet, Take 1 tablet (20 mEq total) by mouth daily., Disp: 5 tablet, Rfl: 0 .  Topiramate ER 100 MG CP24, Take 200 mg by mouth at bedtime. (Patient taking differently: Take 100 mg by mouth at bedtime. ), Disp: 60 capsule, Rfl: 3 .  traZODone (DESYREL) 50 MG tablet, Take 0.5-1 tablets (25-50 mg total) by mouth at bedtime as needed for sleep., Disp: 30 tablet, Rfl: 1   Consults:  N/A  Significant Diagnostic Studies:  Results for orders placed or performed  during the hospital encounter of 02/16/17  CBC with Differential/Platelet  Result Value Ref Range   WBC 16.0 (H) 4.0 - 10.5 K/uL   RBC 2.39 (L) 3.87 - 5.11 MIL/uL   Hemoglobin 8.3 (L) 12.0 - 15.0 g/dL   HCT 40.923.2 (L) 81.136.0 - 91.446.0 %   MCV 97.1 78.0 - 100.0 fL   MCH 34.7 (H) 26.0 - 34.0 pg   MCHC 35.8 30.0 - 36.0 g/dL   RDW 78.220.9 (H) 95.611.5 - 21.315.5 %   Platelets 329 150 - 400 K/uL   Neutrophils Relative % 73 %   Neutro Abs 11.7 (H) 1.7 - 7.7 K/uL   Lymphocytes Relative 20 %   Lymphs Abs 3.1 0.7 - 4.0 K/uL   Monocytes Relative 7 %   Monocytes Absolute 1.1 (H) 0.1 - 1.0 K/uL   Eosinophils Relative 0 %   Eosinophils Absolute 0.1 0.0 - 0.7 K/uL   Basophils Relative 0 %   Basophils Absolute 0.0 0.0 - 0.1 K/uL  Comprehensive metabolic panel  Result Value Ref Range   Sodium 138 135 - 145 mmol/L   Potassium 2.7 (LL) 3.5 - 5.1 mmol/L   Chloride 111 101 - 111 mmol/L   CO2 20 (L) 22 - 32 mmol/L   Glucose, Bld 123 (H) 65 - 99 mg/dL   BUN 5 (L) 6 - 20 mg/dL   Creatinine, Ser 0.860.36 (L) 0.44 - 1.00 mg/dL   Calcium 8.6 (L) 8.9 - 10.3  mg/dL   Total Protein 7.1 6.5 - 8.1 g/dL   Albumin 3.9 3.5 - 5.0 g/dL   AST 47 (H) 15 - 41 U/L   ALT 28 14 - 54 U/L   Alkaline Phosphatase 93 38 - 126 U/L   Total Bilirubin 2.7 (H) 0.3 - 1.2 mg/dL   GFR calc non Af Amer >60 >60 mL/min   GFR calc Af Amer >60 >60 mL/min   Anion gap 7 5 - 15  Reticulocytes  Result Value Ref Range   Retic Ct Pct 15.0 (H) 0.4 - 3.1 %   RBC. 2.39 (L) 3.87 - 5.11 MIL/uL   Retic Count, Absolute 358.5 (H) 19.0 - 186.0 K/uL    [facility] Course:  Brittany Foster is a 31 y.o. female with a diagnosis of Sickle Cell Anemia, presented today with a complaint of her typical sickle pain. Brittany Foster reports initial onset of pain occurred two days prior and she attempted to relieve pain with medication she has previously been prescribed from one her most recent emergency department visits. Current pain intensity 7/10 and is characterized  as aching and throbbing. Reports taking her last dose of pain medication around 0700 this morning without relief of pain. [name] has had numerous ED visits at multiple health systems due to chronic unmanageable pain. At present, Brittany Foster denies headache, fever,shortness of breath, chest pain, dysuria, nausea, vomiting, or diarrhea. She was admitted to the day infusion center for extended observation and pain management. The following was the course of treatment: She was administered  Intravenous D5.45 @ 125 cc/hr administer for cellular rehydration. Toradol 30 mg intravenously for inflammation reduction.Placed on a High Concentration PCA per weight based protocol for pain control. Patient goal for self management achieved per patient. Brittany Foster total delivered 10.9mg . Current pain intensity 5/10improved from admission pain intensity of 7/10.Reviewed laboratory values, mild leucocytosis 16.0, hemoglobin stable 8.3, potassium low at 2.7. Repleted potassium with KDUR 40 meq and outpatient prescription sent to continue potassium repletion with KDUR 20 meq x 5 days. Brittany Foster has a low grade fever 99.6 on arrival and was treated with acetaminophen PO 650 mg once. Brittany Foster remained free of shortness of breath ,chest pain, headache, or weakness. She is alert, oriented, non-distressed, and ambulatory.     Physical Exam at Discharge: BP 106/61 (BP Location: Right Arm)   Pulse 95   Temp 98 F (36.7 C) (Oral)   Resp 10   SpO2 96%  General Appearance:    Alert, cooperative, no distress, appears stated age  Head:    Normocephalic, without obvious abnormality, atraumatic  Eyes:    PERRL, conjunctiva/corneas clear, EOM's intact  Back:     Symmetric, no curvature, ROM normal, no CVA tenderness  Lungs:     Clear to auscultation bilaterally, respirations unlabored  Chest Wall:    No tenderness or deformity   Heart:    Regular rate and rhythm, S1 and S2 normal, no murmur, rub   or gallop  Abdomen:     Soft,  non-tender, bowel sounds active all four quadrants,    no masses, no organomegaly  Extremities:   Extremities normal, atraumatic, no cyanosis or edema  Neurologic:   Normal strength, sensation   Disposition at Discharge: 01-Home or Self Care  Discharge Orders: -Return for repeat labs 02/17/2017-check potassium, CBC, and vitals.   -Continue to hydrate and take prescribed home medications as ordered. -Resume all home medications. -Keep upcoming appointment  -The patient was given clear instructions to go to  ER or return to medical center if symptoms do not improve, worsen or new problems develop. The patient verbalized understanding.  Condition at Discharge:   Stable  Time spent on Discharge:  Greater than 30 minutes.  Signed: Joaquin CourtsKimberly Breezy Foster 02/16/2017, 5:22 PM

## 2017-02-16 NOTE — Discharge Instructions (Signed)
Return tomorrow for repeat labs. Your WBC were mildly elevated and potassium was low. Please pick-up potassium replacement at your pharmacy.    Sickle Cell Anemia, Adult Sickle cell anemia is a condition where your red blood cells are shaped like sickles. Red blood cells carry oxygen through the body. Sickle-shaped red blood cells do not live as long as normal red blood cells. They also clump together and block blood from flowing through the blood vessels. These things prevent the body from getting enough oxygen. Sickle cell anemia causes organ damage and pain. It also increases the risk of infection. Follow these instructions at home:  Drink enough fluid to keep your pee (urine) clear or pale yellow. Drink more in hot weather and during exercise.  Do not smoke. Smoking lowers oxygen levels in the blood.  Only take over-the-counter or prescription medicines as told by your doctor.  Take antibiotic medicines as told by your doctor. Make sure you finish them even if you start to feel better.  Take supplements as told by your doctor.  Consider wearing a medical alert bracelet. This tells anyone caring for you in an emergency of your condition.  When traveling, keep your medical information, doctors' names, and the medicines you take with you at all times.  If you have a fever, do not take fever medicines right away. This could cover up a problem. Tell your doctor.  Keep all follow-up visits with your doctor. Sickle cell anemia requires regular medical care. Contact a doctor if: You have a fever. Get help right away if:  You feel dizzy or faint.  You have new belly (abdominal) pain, especially on the left side near the stomach area.  You have a lasting, often uncomfortable and painful erection of the penis (priapism). If it is not treated right away, you will become unable to have sex (impotence).  You have numbness in your arms or legs or you have a hard time moving them.  You have  a hard time talking.  You have a fever or lasting symptoms for more than 2-3 days.  You have a fever and your symptoms suddenly get worse.  You have signs or symptoms of infection. These include: ? Chills. ? Being more tired than normal (lethargy). ? Irritability. ? Poor eating. ? Throwing up (vomiting).  You have pain that is not helped with medicine.  You have shortness of breath.  You have pain in your chest.  You are coughing up pus-like or bloody mucus.  You have a stiff neck.  Your feet or hands swell or have pain.  Your belly looks bloated.  Your joints hurt. This information is not intended to replace advice given to you by your health care provider. Make sure you discuss any questions you have with your health care provider. Document Released: 12/07/2012 Document Revised: 07/25/2015 Document Reviewed: 09/28/2012 Elsevier Interactive Patient Education  2017 [employer]

## 2017-02-16 NOTE — Telephone Encounter (Signed)
Patient called requesting to be seen at the day hospital. Patient complaining of pain rated 7/10 in lower back and legs. Selena BattenKim, FNP notified and advised patient to come to day hospital to be treated for Sickle Cell pain crisis. Patient given this information and expresses an understanding.

## 2017-02-16 NOTE — Progress Notes (Signed)
CRITICAL VALUE ALERT  Critical Value:  Potassium 2.7  Date & Time Notied:  02/16/17 1435  Provider Notified: Colin MuldersK. Harris, FNP  Orders Received/Actions taken: Order for oral potassium

## 2017-02-16 NOTE — Progress Notes (Signed)
Patient has low potassium of 2.7. Sending prescription for potassium 20 meq x 5 days to pharmacy.  Godfrey PickKimberly S. Tiburcio PeaHarris, MSN, FNP-C [facility]  [address] [place], [place] [address] [phone]

## 2017-02-16 NOTE — Progress Notes (Signed)
Pt discharged to home; discharge instructions explained, given, and signed; all questions answered; PICC line flushed per protocol; no complications noted; pt alert, oriented, and ambulatory upon discharge; accompanied by family

## 2017-02-16 NOTE — H&P (Signed)
[facility] History and Physical   Date: 02/16/2017  Patient name: Brittany Foster Medical record number: [id number] Date of birth: 09/06/1985 Age: 31 y.o. Gender: female PCP: Brittany Foster  Attending physician: Brittany Foster  Chief Complaint: Sickle cell pain  History of Present Illness:  Brittany Foster is a 31 y.o. female with a diagnosis of Sickle Cell Anemia, presents today with a complaint of her typical sickle pain. [name] reports initial onset of pain occurred two days prior and she attempted to relieve pain with medication she has previously been prescribed from one her most recent emergency department visits. Current pain intensity 7/10 and is characterized as aching and throbbing. Reports taking her last dose of pain medication around 0700 this morning without relief of pain. Brittany Foster has had numerous ED visits at multiple health systems due to chronic unmanageable pain. At present, Brittany Foster denies headache, fever, shortness of breath, chest pain, dysuria, nausea, vomiting, or diarrhea. She is being admitted to the day infusion center for extended observation and pain management.      Meds: Medications Prior to Admission  Medication Sig Dispense Refill Last Dose  . ARIPiprazole (ABILIFY) 10 MG tablet Take 10 mg by mouth daily  3 Taking  . DULoxetine (CYMBALTA) 60 MG capsule TAKE 1 CAPSULE BY MOUTH DAILY. 30 capsule 3 Taking  . folic acid (FOLVITE) 1 MG tablet Take 1 tablet (1 mg total) by mouth daily. 90 tablet 3 Taking  . hydroxyurea (HYDREA) 500 MG capsule TAKE 2 CAPSULES BY MOUTH DAILY. MAY TAKE WITH FOOD TO MINIMIZE GI SIDE EFFECTS. (Patient taking differently: Take 1,500 mg by mouth daily. ) 180 capsule 3 Taking  . ibuprofen (ADVIL,MOTRIN) 200 MG tablet Take 400 mg by mouth every 6 (six) hours as needed.   Taking  . lactulose (CEPHULAC) 20 g packet Take 1 packet (20 g total) by mouth 3 (three) times daily as needed. 30 each 0   .  medroxyPROGESTERone (DEPO-PROVERA) 150 MG/ML injection Inject 150 mg into the muscle every 3 (three) months.    Taking  . methocarbamol (ROBAXIN) 500 MG tablet Take 1 tablet (500 mg total) by mouth 4 (four) times daily. 120 tablet 0   . Topiramate ER 100 MG CP24 Take 200 mg by mouth at bedtime. (Patient taking differently: Take 100 mg by mouth at bedtime. ) 60 capsule 3 Taking  . traZODone (DESYREL) 50 MG tablet Take 0.5-1 tablets (25-50 mg total) by mouth at bedtime as needed for sleep. 30 tablet 1     Allergies: Ultram [tramadol]; Zofran [ondansetron hcl]; Buprenorphine hcl; Fentanyl; Morphine and related; and Tape Past Medical History:  Diagnosis Date  . Anemia   . Depression, major, recurrent (HCC)   . Migraines   . Sickle cell anemia (HCC)    Past Surgical History:  Procedure Laterality Date  . CESAREAN SECTION    . CHOLECYSTECTOMY  2000  . IR GENERIC HISTORICAL  10/08/2015   IR US GUIDE VASC ACCESS RIGHT 10/08/2015 Brittany ComeJohn Watts, Foster [facility]  . IR GENERIC HISTORICAL  10/08/2015   IR FLUORO GUIDE CV LINE RIGHT 10/08/2015 Brittany ComeJohn Watts, Foster [facility]  . MULTIPLE TOOTH EXTRACTIONS N/A   . port a cath placement Right    about 6-7 years ago  . removal of porta cath Right 09/11/15  . TUBAL LIGATION     Family History  Problem Relation Age of Onset  . Sickle cell trait Father   . Sickle cell trait Mother   .  Sickle cell anemia Other    Social History   Socioeconomic History  . Marital status: Single    Spouse name: Not on file  . Number of children: Not on file  . Years of education: Not on file  . Highest education level: Not on file  Social Needs  . Financial resource strain: Not on file  . Food insecurity - worry: Not on file  . Food insecurity - inability: Not on file  . Transportation needs - medical: Not on file  . Transportation needs - non-medical: Not on file  Occupational History  . Occupation: None  Tobacco Use  . Smoking status: Current Some Day Smoker     Packs/day: 1.00  . Smokeless tobacco: Current User  Substance and Sexual Activity  . Alcohol use: No    Alcohol/week: 0.0 oz  . Drug use: No  . Sexual activity: Yes    Partners: Male    Birth control/protection: Injection    Comment: 1 new partner recently, no concern about STI, uses condoms  Other Topics Concern  . Not on file  Social History Narrative   836 year old daughter    Review of Systems: Constitutional: negative Cardiovascular: negative Gastrointestinal: negative Musculoskeletal:positive for arthralgias Neurological: negative   Physical Exam: Blood pressure 124/79, pulse (!) 109, temperature 99.6 F (37.6 C), temperature source Oral, resp. rate 16, SpO2 97 %.  General Appearance:    Alert, cooperative, no distress, appears stated age  Head:    Normocephalic, without obvious abnormality, atraumatic  Eyes:    PERRL, conjunctiva/corneas clear, EOM's intact  Back:     Symmetric, no curvature, ROM normal, no CVA tenderness  Lungs:     Clear to auscultation bilaterally, respirations unlabored  Chest Wall:    No tenderness or deformity   Heart:    Regular rate and rhythm, S1 and S2 normal, no murmur, rub   or gallop  Abdomen:     Soft, non-tender, bowel sounds active all four quadrants,    no masses, no organomegaly  Extremities:   Extremities normal, atraumatic, no cyanosis or edema  Neurologic:   Normal strength, sensation    Lab results: Pending   Imaging results:  N/A   Assessment & Plan:  Patient will be admitted to the day infusion center for extended observation  Start IV D5.45 for cellular rehydration at 125/hr  Start Toradol 30 mg IV every 6 hours for inflammation.  Start Dilaudid PCA High Concentration per weight based protocol.   Patient will be re-evaluated for pain intensity in the context of function and relationship to baseline as care progresses.  If no significant pain relief, will transfer patient to inpatient services for a higher level  of care.   Will check CMP, CBC w/differential, and Reticulocyte count, UA     Joaquin CourtsKimberly Harla Mensch 02/16/2017, 10:36 AM

## 2017-02-17 ENCOUNTER — Encounter (HOSPITAL_COMMUNITY): Payer: Self-pay

## 2017-02-17 ENCOUNTER — Other Ambulatory Visit: Payer: Self-pay | Admitting: Family Medicine

## 2017-02-17 ENCOUNTER — Inpatient Hospital Stay (HOSPITAL_COMMUNITY)
Admission: AD | Admit: 2017-02-17 | Discharge: 2017-02-24 | DRG: 812 | Disposition: A | Discharge status: Home / Self Care | Payer: Medicaid Other | Source: Ambulatory Visit | Attending: Internal Medicine | Admitting: Internal Medicine

## 2017-02-17 ENCOUNTER — Non-Acute Institutional Stay (HOSPITAL_COMMUNITY): Payer: Medicaid Other

## 2017-02-17 DIAGNOSIS — D638 Anemia in other chronic diseases classified elsewhere: Secondary | ICD-10-CM | POA: Diagnosis present

## 2017-02-17 DIAGNOSIS — F339 Major depressive disorder, recurrent, unspecified: Secondary | ICD-10-CM | POA: Diagnosis present

## 2017-02-17 DIAGNOSIS — R509 Fever, unspecified: Secondary | ICD-10-CM

## 2017-02-17 DIAGNOSIS — Z91048 Other nonmedicinal substance allergy status: Secondary | ICD-10-CM | POA: Diagnosis not present

## 2017-02-17 DIAGNOSIS — Z832 Family history of diseases of the blood and blood-forming organs and certain disorders involving the immune mechanism: Secondary | ICD-10-CM

## 2017-02-17 DIAGNOSIS — F17210 Nicotine dependence, cigarettes, uncomplicated: Secondary | ICD-10-CM | POA: Diagnosis present

## 2017-02-17 DIAGNOSIS — G43909 Migraine, unspecified, not intractable, without status migrainosus: Secondary | ICD-10-CM | POA: Diagnosis present

## 2017-02-17 DIAGNOSIS — J069 Acute upper respiratory infection, unspecified: Secondary | ICD-10-CM | POA: Diagnosis present

## 2017-02-17 DIAGNOSIS — E876 Hypokalemia: Secondary | ICD-10-CM | POA: Diagnosis present

## 2017-02-17 DIAGNOSIS — Z791 Long term (current) use of non-steroidal anti-inflammatories (NSAID): Secondary | ICD-10-CM | POA: Diagnosis not present

## 2017-02-17 DIAGNOSIS — J206 Acute bronchitis due to rhinovirus: Secondary | ICD-10-CM | POA: Diagnosis present

## 2017-02-17 DIAGNOSIS — Z79899 Other long term (current) drug therapy: Secondary | ICD-10-CM | POA: Diagnosis not present

## 2017-02-17 DIAGNOSIS — R059 Cough, unspecified: Secondary | ICD-10-CM | POA: Insufficient documentation

## 2017-02-17 DIAGNOSIS — R000 Tachycardia, unspecified: Secondary | ICD-10-CM | POA: Diagnosis present

## 2017-02-17 DIAGNOSIS — Z9049 Acquired absence of other specified parts of digestive tract: Secondary | ICD-10-CM

## 2017-02-17 DIAGNOSIS — B974 Respiratory syncytial virus as the cause of diseases classified elsewhere: Secondary | ICD-10-CM | POA: Diagnosis present

## 2017-02-17 DIAGNOSIS — Z885 Allergy status to narcotic agent status: Secondary | ICD-10-CM | POA: Diagnosis not present

## 2017-02-17 DIAGNOSIS — D5700 Hb-SS disease with crisis, unspecified: Principal | ICD-10-CM | POA: Diagnosis present

## 2017-02-17 DIAGNOSIS — D571 Sickle-cell disease without crisis: Secondary | ICD-10-CM | POA: Diagnosis present

## 2017-02-17 DIAGNOSIS — R5081 Fever presenting with conditions classified elsewhere: Secondary | ICD-10-CM | POA: Diagnosis not present

## 2017-02-17 DIAGNOSIS — J210 Acute bronchiolitis due to respiratory syncytial virus: Secondary | ICD-10-CM | POA: Diagnosis not present

## 2017-02-17 DIAGNOSIS — J208 Acute bronchitis due to other specified organisms: Secondary | ICD-10-CM | POA: Diagnosis not present

## 2017-02-17 DIAGNOSIS — I9589 Other hypotension: Secondary | ICD-10-CM | POA: Diagnosis not present

## 2017-02-17 DIAGNOSIS — R05 Cough: Secondary | ICD-10-CM | POA: Diagnosis not present

## 2017-02-17 DIAGNOSIS — I959 Hypotension, unspecified: Secondary | ICD-10-CM | POA: Diagnosis not present

## 2017-02-17 DIAGNOSIS — Z9851 Tubal ligation status: Secondary | ICD-10-CM

## 2017-02-17 DIAGNOSIS — G894 Chronic pain syndrome: Secondary | ICD-10-CM | POA: Diagnosis present

## 2017-02-17 LAB — CBC WITH DIFFERENTIAL/PLATELET
BASOS PCT: 0 %
Basophils Absolute: 0.1 10*3/uL (ref 0.0–0.1)
Eosinophils Absolute: 0 10*3/uL (ref 0.0–0.7)
Eosinophils Relative: 0 %
HEMATOCRIT: 23.8 % — AB (ref 36.0–46.0)
HEMOGLOBIN: 8.4 g/dL — AB (ref 12.0–15.0)
LYMPHS ABS: 2.5 10*3/uL (ref 0.7–4.0)
LYMPHS PCT: 15 %
MCH: 34.6 pg — AB (ref 26.0–34.0)
MCHC: 35.3 g/dL (ref 30.0–36.0)
MCV: 97.9 fL (ref 78.0–100.0)
MONO ABS: 1.6 10*3/uL — AB (ref 0.1–1.0)
MONOS PCT: 10 %
NEUTROS ABS: 12.6 10*3/uL — AB (ref 1.7–7.7)
NEUTROS PCT: 75 %
Platelets: 356 10*3/uL (ref 150–400)
RBC: 2.43 MIL/uL — ABNORMAL LOW (ref 3.87–5.11)
RDW: 20.5 % — ABNORMAL HIGH (ref 11.5–15.5)
WBC: 16.9 10*3/uL — ABNORMAL HIGH (ref 4.0–10.5)

## 2017-02-17 LAB — URINALYSIS, COMPLETE (UACMP) WITH MICROSCOPIC
Bacteria, UA: NONE SEEN
GLUCOSE, UA: NEGATIVE mg/dL
HGB URINE DIPSTICK: NEGATIVE
Ketones, ur: NEGATIVE mg/dL
Nitrite: NEGATIVE
PH: 5 (ref 5.0–8.0)
Protein, ur: 30 mg/dL — AB
SPECIFIC GRAVITY, URINE: 1.016 (ref 1.005–1.030)

## 2017-02-17 LAB — COMPREHENSIVE METABOLIC PANEL
ALBUMIN: 4 g/dL (ref 3.5–5.0)
ALK PHOS: 102 U/L (ref 38–126)
ALT: 32 U/L (ref 14–54)
ANION GAP: 5 (ref 5–15)
AST: 52 U/L — ABNORMAL HIGH (ref 15–41)
BILIRUBIN TOTAL: 3.2 mg/dL — AB (ref 0.3–1.2)
BUN: 6 mg/dL (ref 6–20)
CALCIUM: 9 mg/dL (ref 8.9–10.3)
CO2: 20 mmol/L — ABNORMAL LOW (ref 22–32)
Chloride: 112 mmol/L — ABNORMAL HIGH (ref 101–111)
Creatinine, Ser: 0.33 mg/dL — ABNORMAL LOW (ref 0.44–1.00)
GFR calc Af Amer: >60 mL/min (ref >60)
GLUCOSE: 117 mg/dL — AB (ref 65–99)
Potassium: 3 mmol/L — ABNORMAL LOW (ref 3.5–5.1)
Sodium: 137 mmol/L (ref 135–145)
TOTAL PROTEIN: 7.6 g/dL (ref 6.5–8.1)

## 2017-02-17 LAB — RETICULOCYTES
RBC.: 2.43 MIL/uL — ABNORMAL LOW (ref 3.87–5.11)
RETIC COUNT ABSOLUTE: 366.9 10*3/uL — AB (ref 19.0–186.0)
RETIC CT PCT: 15.1 % — AB (ref 0.4–3.1)

## 2017-02-17 LAB — PREGNANCY, URINE: PREG TEST UR: NEGATIVE

## 2017-02-17 LAB — INFLUENZA PANEL BY PCR (TYPE A & B)
Influenza A By PCR: NEGATIVE
Influenza B By PCR: NEGATIVE

## 2017-02-17 MED ORDER — DEXTROSE-NACL 5-0.45 % IV SOLN
INTRAVENOUS | Status: DC
  Administered 2017-02-17 – 2017-02-20 (×3): via INTRAVENOUS
  Administered 2017-02-21: 1000 mL via INTRAVENOUS
  Administered 2017-02-21 – 2017-02-24 (×4): via INTRAVENOUS

## 2017-02-17 MED ORDER — DIPHENHYDRAMINE HCL 25 MG PO CAPS
25.0000 mg | ORAL_CAPSULE | ORAL | Status: DC | PRN
  Filled 2017-02-17: qty 2

## 2017-02-17 MED ORDER — HYDROXYUREA 500 MG PO CAPS
1500.0000 mg | ORAL_CAPSULE | Freq: Every day | ORAL | Status: DC
  Administered 2017-02-18 – 2017-02-24 (×7): 1500 mg via ORAL
  Filled 2017-02-17 (×7): qty 3

## 2017-02-17 MED ORDER — SENNOSIDES-DOCUSATE SODIUM 8.6-50 MG PO TABS
1.0000 | ORAL_TABLET | Freq: Two times a day (BID) | ORAL | Status: DC
  Administered 2017-02-18 – 2017-02-23 (×4): 1 via ORAL
  Filled 2017-02-17 (×9): qty 1

## 2017-02-17 MED ORDER — MORPHINE SULFATE ER 15 MG PO TBCR
15.0000 mg | EXTENDED_RELEASE_TABLET | Freq: Two times a day (BID) | ORAL | Status: DC
  Administered 2017-02-17 – 2017-02-24 (×13): 15 mg via ORAL
  Filled 2017-02-17 (×13): qty 1

## 2017-02-17 MED ORDER — TRAZODONE 25 MG HALF TABLET
25.0000 mg | ORAL_TABLET | Freq: Every evening | ORAL | Status: DC | PRN
  Administered 2017-02-17: 50 mg via ORAL
  Filled 2017-02-17 (×2): qty 2

## 2017-02-17 MED ORDER — ONDANSETRON HCL 4 MG/2ML IJ SOLN: 4.0000 mg | Freq: Four times a day (QID) | INTRAMUSCULAR | Status: DC | PRN

## 2017-02-17 MED ORDER — HYDROMORPHONE 1 MG/ML IV SOLN
INTRAVENOUS | Status: DC
  Administered 2017-02-17: 30 mg via INTRAVENOUS
  Filled 2017-02-17: qty 30

## 2017-02-17 MED ORDER — TOPIRAMATE 100 MG PO TABS
100.0000 mg | ORAL_TABLET | Freq: Two times a day (BID) | ORAL | Status: DC
  Administered 2017-02-17 – 2017-02-24 (×13): 100 mg via ORAL
  Filled 2017-02-17 (×13): qty 1

## 2017-02-17 MED ORDER — ACETAMINOPHEN 325 MG PO TABS
650.0000 mg | ORAL_TABLET | Freq: Once | ORAL | Status: AC
Start: 2017-02-17 — End: 2017-02-17
  Administered 2017-02-17: 650 mg via ORAL
  Filled 2017-02-17: qty 2

## 2017-02-17 MED ORDER — FOLIC ACID 1 MG PO TABS
1.0000 mg | ORAL_TABLET | Freq: Every day | ORAL | Status: DC
  Administered 2017-02-18 – 2017-02-24 (×7): 1 mg via ORAL
  Filled 2017-02-17 (×7): qty 1

## 2017-02-17 MED ORDER — PROMETHAZINE HCL 25 MG RE SUPP
12.5000 mg | RECTAL | Status: DC | PRN
  Administered 2017-02-17 – 2017-02-23 (×8): 25 mg via RECTAL
  Filled 2017-02-17 (×3): qty 1
  Filled 2017-02-17: qty 2
  Filled 2017-02-17 (×5): qty 1

## 2017-02-17 MED ORDER — DULOXETINE HCL 60 MG PO CPEP
60.0000 mg | ORAL_CAPSULE | Freq: Every day | ORAL | Status: DC
  Administered 2017-02-18 – 2017-02-24 (×7): 60 mg via ORAL
  Filled 2017-02-17 (×7): qty 1

## 2017-02-17 MED ORDER — POTASSIUM CHLORIDE CRYS ER 20 MEQ PO TBCR
40.0000 meq | EXTENDED_RELEASE_TABLET | Freq: Once | ORAL | Status: AC
  Administered 2017-02-20: 40 meq via ORAL
  Filled 2017-02-17 (×3): qty 2

## 2017-02-17 MED ORDER — KETOROLAC TROMETHAMINE 30 MG/ML IJ SOLN
30.0000 mg | Freq: Once | INTRAMUSCULAR | Status: AC
  Administered 2017-02-17: 30 mg via INTRAVENOUS
  Filled 2017-02-17: qty 1

## 2017-02-17 MED ORDER — NALOXONE HCL 0.4 MG/ML IJ SOLN: 0.4000 mg | INTRAMUSCULAR | Status: DC | PRN

## 2017-02-17 MED ORDER — OXYCODONE-ACETAMINOPHEN 5-325 MG PO TABS: 2.0000 | ORAL_TABLET | Freq: Once | ORAL | Status: DC

## 2017-02-17 MED ORDER — ENOXAPARIN SODIUM 30 MG/0.3ML ~~LOC~~ SOLN: 30.0000 mg | SUBCUTANEOUS | Status: DC

## 2017-02-17 MED ORDER — POLYETHYLENE GLYCOL 3350 17 G PO PACK: 17.0000 g | PACK | Freq: Every day | ORAL | Status: DC | PRN

## 2017-02-17 MED ORDER — HYDROMORPHONE 1 MG/ML IV SOLN
INTRAVENOUS | Status: DC
  Administered 2017-02-17: 14.25 mg via INTRAVENOUS
  Administered 2017-02-18: 3.5 mg via INTRAVENOUS
  Administered 2017-02-18: 7 mg via INTRAVENOUS
  Administered 2017-02-18: 8 mg via INTRAVENOUS
  Administered 2017-02-18: 11 mg via INTRAVENOUS
  Administered 2017-02-18: 4 mg via INTRAVENOUS
  Administered 2017-02-18: 6 mg via INTRAVENOUS
  Administered 2017-02-18 – 2017-02-19 (×2): via INTRAVENOUS
  Administered 2017-02-19: 16 mg via INTRAVENOUS
  Administered 2017-02-19: 6.3 mg via INTRAVENOUS
  Administered 2017-02-19: 5.5 mg via INTRAVENOUS
  Administered 2017-02-19 (×2): 6 mg via INTRAVENOUS
  Administered 2017-02-19: 4 mg via INTRAVENOUS
  Administered 2017-02-20: 2.5 mg via INTRAVENOUS
  Administered 2017-02-20: via INTRAVENOUS
  Administered 2017-02-20: 9.5 mg via INTRAVENOUS
  Administered 2017-02-20: 4 mg via INTRAVENOUS
  Filled 2017-02-17 (×4): qty 25

## 2017-02-17 MED ORDER — SODIUM CHLORIDE 0.9 % IV SOLN
25.0000 mg | INTRAVENOUS | Status: DC | PRN
  Administered 2017-02-21 – 2017-02-22 (×3): 25 mg via INTRAVENOUS
  Filled 2017-02-17 (×4): qty 0.5

## 2017-02-17 MED ORDER — SODIUM CHLORIDE 0.9% FLUSH: 9.0000 mL | INTRAVENOUS | Status: DC | PRN

## 2017-02-17 MED ORDER — ARIPIPRAZOLE 10 MG PO TABS
10.0000 mg | ORAL_TABLET | Freq: Every day | ORAL | Status: DC
  Administered 2017-02-18 – 2017-02-24 (×7): 10 mg via ORAL
  Filled 2017-02-17 (×7): qty 1

## 2017-02-17 MED ORDER — TOPIRAMATE ER 100 MG PO CAP24: 200.0000 mg | ORAL_CAPSULE | Freq: Every day | ORAL | Status: DC

## 2017-02-17 MED ORDER — PROMETHAZINE HCL 25 MG PO TABS
12.5000 mg | ORAL_TABLET | ORAL | Status: DC | PRN
  Administered 2017-02-24: 25 mg via ORAL
  Filled 2017-02-17: qty 1

## 2017-02-17 MED ORDER — KETOROLAC TROMETHAMINE 30 MG/ML IJ SOLN
30.0000 mg | Freq: Four times a day (QID) | INTRAMUSCULAR | Status: AC
  Administered 2017-02-17 – 2017-02-22 (×19): 30 mg via INTRAVENOUS
  Filled 2017-02-17 (×19): qty 1

## 2017-02-17 NOTE — Progress Notes (Signed)
[facility] for generalized [facility] and fatigue. Received medications and IV fluids per orders. Orders received to admit patient to inpatient care. Report called to [place] Lucia[place], Charity fundraiserN. Pt transported to 1330 by Marin ShutterSteven Marshall, RN. Pt alert and in stable condition. Ardyth GalAnderson, Roslynn Holte Ann, RN 02/17/2017

## 2017-02-17 NOTE — H&P (Signed)
[facility] History and Physical   Date: 02/17/2017  Patient name: Brittany Foster Medical record number: [id number] Date of birth: 10/31/1985 Age: 31 y.o. Gender: female PCP: Bing NeighborsHarris, Eldwin Volkov S, FNP  Attending physician: Quentin AngstJegede, Olugbemiga E, MD  Chief Complaint:   Generalized Pain and Fatigue   History of Present Illness: Brittany Foster is a 31 y.o. female with a diagnosis of Sickle Cell Anemia, presents today with a complaint of her worsening generalized pain and fatigue. Brittany Foster was treated in the day infusion center on 02/16/2017 for sickle cell pain crisis and was found to have K+ 2.7 and low-grade fever. She was advised to return today for further evaluation and observation. Brittany Foster reports worsening pain all over, fatigue, and worsening cough. She is uncertain of fever. Current pain 8/10. Brittany Foster has had numerous ED visits at multiple health systems due to chronic unmanageable pain. At present, she denies headache, shortness of breath, chest pain, dysuria, nausea, vomiting, or diarrhea. Brittany Foster is being admitted to the day infusion center for extended observation and pain management.   Meds: Medications Prior to Admission  Medication Sig Dispense Refill Last Dose  . ARIPiprazole (ABILIFY) 10 MG tablet Take 10 mg by mouth daily  3 Taking  . DULoxetine (CYMBALTA) 60 MG capsule TAKE 1 CAPSULE BY MOUTH DAILY. 30 capsule 3 Taking  . folic acid (FOLVITE) 1 MG tablet Take 1 tablet (1 mg total) by mouth daily. 90 tablet 3 Taking  . hydroxyurea (HYDREA) 500 MG capsule TAKE 2 CAPSULES BY MOUTH DAILY. MAY TAKE WITH FOOD TO MINIMIZE GI SIDE EFFECTS. (Patient taking differently: Take 1,500 mg by mouth daily. ) 180 capsule 3 Taking  . ibuprofen (ADVIL,MOTRIN) 200 MG tablet Take 400 mg by mouth every 6 (six) hours as needed.   Taking  . lactulose (CEPHULAC) 20 g packet Take 1 packet (20 g total) by mouth 3 (three) times daily as needed. 30 each 0   . medroxyPROGESTERone (DEPO-PROVERA) 150  MG/ML injection Inject 150 mg into the muscle every 3 (three) months.    Taking  . methocarbamol (ROBAXIN) 500 MG tablet Take 1 tablet (500 mg total) by mouth 4 (four) times daily. 120 tablet 0   . potassium chloride SA (K-DUR,KLOR-CON) 20 MEQ tablet Take 1 tablet (20 mEq total) by mouth daily. 5 tablet 0   . Topiramate ER 100 MG CP24 Take 200 mg by mouth at bedtime. (Patient taking differently: Take 100 mg by mouth at bedtime. ) 60 capsule 3 Taking  . traZODone (DESYREL) 50 MG tablet Take 0.5-1 tablets (25-50 mg total) by mouth at bedtime as needed for sleep. 30 tablet 1     Allergies: Ultram [tramadol]; Zofran [ondansetron hcl]; Buprenorphine hcl; Fentanyl; Morphine and related; and Tape Past Medical History:  Diagnosis Date  . Anemia   . Depression, major, recurrent (HCC)   . Migraines   . Sickle cell anemia (HCC)    Past Surgical History:  Procedure Laterality Date  . CESAREAN SECTION    . CHOLECYSTECTOMY  2000  . IR GENERIC HISTORICAL  10/08/2015   IR US GUIDE VASC ACCESS RIGHT 10/08/2015 Simonne ComeJohn Watts, MD [facility]  . IR GENERIC HISTORICAL  10/08/2015   IR FLUORO GUIDE CV LINE RIGHT 10/08/2015 Simonne ComeJohn Watts, MD [facility]  . MULTIPLE TOOTH EXTRACTIONS N/A   . port a cath placement Right    about 6-7 years ago  . removal of porta cath Right 09/11/15  . TUBAL LIGATION     Family History  Problem  Relation Age of Onset  . Sickle cell trait Father   . Sickle cell trait Mother   . Sickle cell anemia Other    Social History   Socioeconomic History  . Marital status: Single    Spouse name: Not on file  . Number of children: Not on file  . Years of education: Not on file  . Highest education level: Not on file  Social Needs  . Financial resource strain: Not on file  . Food insecurity - worry: Not on file  . Food insecurity - inability: Not on file  . Transportation needs - medical: Not on file  . Transportation needs - non-medical: Not on file  Occupational History  .  Occupation: None  Tobacco Use  . Smoking status: Current Some Day Smoker    Packs/day: 1.00  . Smokeless tobacco: Current User  Substance and Sexual Activity  . Alcohol use: No    Alcohol/week: 0.0 oz  . Drug use: No  . Sexual activity: Yes    Partners: Male    Birth control/protection: Injection    Comment: 1 new partner recently, no concern about STI, uses condoms  Other Topics Concern  . Not on file  Social History Narrative   32 year old daughter    Review of Systems: Constitutional: Fatigue  Cardiovascular: negative Gastrointestinal: negative Musculoskeletal:Positive for Arthralgias Neurological: negative  Physical Exam: Blood pressure 113/66, pulse (!) 114, temperature 99.8 F (37.7 C), resp. rate (!) 22, weight 132 lb (59.9 kg), SpO2 95 %. General Appearance:    Alert, cooperative, no distress, appears stated age  Head:    Normocephalic, without obvious abnormality, atraumatic  Eyes:    PERRL, conjunctiva/corneas clear, EOM's intact  Back:     Symmetric, no curvature, ROM normal, no CVA tenderness  Lungs:     Clear to auscultation bilaterally, respirations unlabored  Chest Wall:    No tenderness or deformity   Heart:    Regular rate and rhythm, S1 and S2 normal, no murmur, rub   or gallop  Abdomen:     Soft, non-tender, bowel sounds active all four quadrants,    no masses, no organomegaly  Extremities:   Extremities normal, atraumatic, no cyanosis or edema  Neurologic:   Normal strength, sensation     Lab results: Imaging results:  Pending   Assessment & Plan:  Patient will be admitted to the day infusion center for extended observation  Stat Influenza Rapid Screening   Start IV D5.45 for cellular rehydration at 125/hr  Start Toradol 30 mg IV every 6 hours for inflammation.  Start Dilaudid PCA High Concentration per weight based protocol.   Patient will be re-evaluated for pain intensity in the context of function and relationship to baseline as care  progresses.  If no significant pain relief, will transfer patient to inpatient services for a higher level of care.   Will check STAT CMP, CBC w/differential, routine Reticulocyte count, and UA    Joaquin CourtsKimberly Tamieka Rancourt 02/17/2017, 10:43 AM

## 2017-02-17 NOTE — H&P (Signed)
Brittany Foster is an 31 y.o. female.     Chief Complaint: Generalized Pain and Fatigue   HPI:  Brittany Foster a 31 y.o.femalewith a diagnosis of Sickle Cell Anemia, presented to the day infusion center today with a complaint of her worsening generalized pain and fatigue. Brittany Foster was treated in the day infusion center on 02/16/2017 for sickle cell pain crisis and was found to have K+ 2.7, leucocytosis 16.0, and low-grade fever. She was advised to return today for further evaluation and observation. Brittany Foster worsening pain all over, fatigue, and worsening non-productive-cough overnight. Brittany Foster was febrile upon arrival in the day hospital, tachycardic, and complaining of 8/10 generalized pain. Chest x-ray and influenza testing were negative. Pain only mildly improved 7/10 with Toradol 30 mg and a total delivery of  Hydromorphone 6.75 mg, and continuous IV hydration. Afebrile after receipt of acetaminophen 650 mg. At present, she denies headache, shortness of breath, chest pain, dysuria, nausea, vomiting, or diarrhea.  Past Medical History:  Diagnosis Date  . Anemia   . Depression, major, recurrent ([place])   . Migraines   . Sickle cell anemia (HCC)     Past Surgical History:  Procedure Laterality Date  . CESAREAN SECTION    . CHOLECYSTECTOMY  2000  . IR GENERIC HISTORICAL  10/08/2015   IR US GUIDE VASC ACCESS RIGHT 10/08/2015 Sandi Mariscal, MD [facility]  . IR GENERIC HISTORICAL  10/08/2015   IR FLUORO GUIDE CV LINE RIGHT 10/08/2015 Sandi Mariscal, MD [facility]  . MULTIPLE TOOTH EXTRACTIONS N/A   . port a cath placement Right    about 6-7 years ago  . removal of porta cath Right 09/11/15  . TUBAL LIGATION      Family History  Problem Relation Age of Onset  . Sickle cell trait Father   . Sickle cell trait Mother   . Sickle cell anemia Other    Social History:  reports that she has been smoking.  She has been smoking about 1.00 pack per day. She uses smokeless tobacco. She reports that  she does not drink alcohol or use drugs.  Allergies:  Allergies  Allergen Reactions  . Ultram [Tramadol] Other (See Comments)    Reaction:  Seizures   . Zofran [Ondansetron Hcl] Nausea And Vomiting  . Buprenorphine Hcl Hives and Other (See Comments)    Reaction:  Shaking   . Fentanyl Hives  . Morphine And Related Hives and Other (See Comments)    Reaction:  Shaking   . Tape Rash    Medications Prior to Admission  Medication Sig Dispense Refill  . ARIPiprazole (ABILIFY) 10 MG tablet Take 10 mg by mouth daily  3  . DULoxetine (CYMBALTA) 60 MG capsule TAKE 1 CAPSULE BY MOUTH DAILY. 30 capsule 3  . folic acid (FOLVITE) 1 MG tablet Take 1 tablet (1 mg total) by mouth daily. 90 tablet 3  . hydroxyurea (HYDREA) 500 MG capsule TAKE 2 CAPSULES BY MOUTH DAILY. MAY TAKE WITH FOOD TO MINIMIZE GI SIDE EFFECTS. (Patient taking differently: Take 1,500 mg by mouth daily. ) 180 capsule 3  . ibuprofen (ADVIL,MOTRIN) 200 MG tablet Take 400 mg by mouth every 6 (six) hours as needed.    . lactulose (CEPHULAC) 20 g packet Take 1 packet (20 g total) by mouth 3 (three) times daily as needed. 30 each 0  . medroxyPROGESTERone (DEPO-PROVERA) 150 MG/ML injection Inject 150 mg into the muscle every 3 (three) months.     . methocarbamol (ROBAXIN) 500 MG tablet Take  1 tablet (500 mg total) by mouth 4 (four) times daily. 120 tablet 0  . potassium chloride SA (K-DUR,KLOR-CON) 20 MEQ tablet Take 1 tablet (20 mEq total) by mouth daily. 5 tablet 0  . Topiramate ER 100 MG CP24 Take 200 mg by mouth at bedtime. (Patient taking differently: Take 100 mg by mouth at bedtime. ) 60 capsule 3  . traZODone (DESYREL) 50 MG tablet Take 0.5-1 tablets (25-50 mg total) by mouth at bedtime as needed for sleep. 30 tablet 1    Results for orders placed or performed during the hospital encounter of 02/17/17 (from the past 48 hour(s))  CBC with Differential/Platelet     Status: Abnormal   Collection Time: 02/17/17 11:06 AM  Result Value  Ref Range   WBC 16.9 (H) 4.0 - 10.5 K/uL   RBC 2.43 (L) 3.87 - 5.11 MIL/uL   Hemoglobin 8.4 (L) 12.0 - 15.0 g/dL   HCT 23.8 (L) 36.0 - 46.0 %   MCV 97.9 78.0 - 100.0 fL   MCH 34.6 (H) 26.0 - 34.0 pg   MCHC 35.3 30.0 - 36.0 g/dL   RDW 20.5 (H) 11.5 - 15.5 %   Platelets 356 150 - 400 K/uL   Neutrophils Relative % 75 %   Neutro Abs 12.6 (H) 1.7 - 7.7 K/uL   Lymphocytes Relative 15 %   Lymphs Abs 2.5 0.7 - 4.0 K/uL   Monocytes Relative 10 %   Monocytes Absolute 1.6 (H) 0.1 - 1.0 K/uL   Eosinophils Relative 0 %   Eosinophils Absolute 0.0 0.0 - 0.7 K/uL   Basophils Relative 0 %   Basophils Absolute 0.1 0.0 - 0.1 K/uL  Comprehensive metabolic panel     Status: Abnormal   Collection Time: 02/17/17 11:06 AM  Result Value Ref Range   Sodium 137 135 - 145 mmol/L   Potassium 3.0 (L) 3.5 - 5.1 mmol/L   Chloride 112 (H) 101 - 111 mmol/L   CO2 20 (L) 22 - 32 mmol/L   Glucose, Bld 117 (H) 65 - 99 mg/dL   BUN 6 6 - 20 mg/dL   Creatinine, Ser 0.33 (L) 0.44 - 1.00 mg/dL   Calcium 9.0 8.9 - 10.3 mg/dL   Total Protein 7.6 6.5 - 8.1 g/dL   Albumin 4.0 3.5 - 5.0 g/dL   AST 52 (H) 15 - 41 U/L   ALT 32 14 - 54 U/L   Alkaline Phosphatase 102 38 - 126 U/L   Total Bilirubin 3.2 (H) 0.3 - 1.2 mg/dL   GFR calc non Af Amer >60 >60 mL/min   GFR calc Af Amer >60 >60 mL/min    Comment: (NOTE) The eGFR has been calculated using the CKD EPI equation. This calculation has not been validated in all clinical situations. eGFR's persistently <60 mL/min signify possible Chronic Kidney Disease.    Anion gap 5 5 - 15  Reticulocytes     Status: Abnormal   Collection Time: 02/17/17 11:06 AM  Result Value Ref Range   Retic Ct Pct 15.1 (H) 0.4 - 3.1 %   RBC. 2.43 (L) 3.87 - 5.11 MIL/uL   Retic Count, Absolute 366.9 (H) 19.0 - 186.0 K/uL  Influenza panel by PCR (type A & B)     Status: None   Collection Time: 02/17/17 12:46 PM  Result Value Ref Range   Influenza A By PCR NEGATIVE NEGATIVE   Influenza B By PCR  NEGATIVE NEGATIVE    Comment: (NOTE) The Xpert Xpress Flu assay is  intended as an aid in the diagnosis of  influenza and should not be used as a sole basis for treatment.  This  assay is FDA approved for nasopharyngeal swab specimens only. Nasal  washings and aspirates are unacceptable for Xpert Xpress Flu testing.    Dg Chest 2 View  Result Date: 02/17/2017 CLINICAL DATA:  Chest pain.  Sickle cell crisis. EXAM: CHEST  2 VIEW COMPARISON:  11/14/2016 FINDINGS: The heart size and pulmonary vascularity are normal. Minimal scarring at the left lung base. Lungs are otherwise clear. Right-sided catheter tip is in the superior vena cava, unchanged. No effusions.  No significant bone abnormality. IMPRESSION: No acute abnormality. Electronically Signed   By: Lorriane Shire M.D.   On: 02/17/2017 13:40   Today's Vitals   02/17/17 1042 02/17/17 1107 02/17/17 1535  BP: 113/66  (!) 95/53  Pulse: (!) 114  94  Resp:  (!) 22 14  Temp: 99.8 F (37.7 C)  98.5 F (36.9 C)  TempSrc:   Oral  SpO2:  95% 94%  Weight: 132 lb (59.9 kg)    PainSc: _0 ROS Constitutional: Fatigue  Cardiovascular: negative Gastrointestinal: negative Musculoskeletal:Positive for Arthralgias Neurological: negative  Physical Exam  General Appearance:    Alert, cooperative, no distress, appears stated age  Head:    Normocephalic, without obvious abnormality, atraumatic  Eyes:    PERRL, conjunctiva/corneas clear, EOM's intact  Back:     Symmetric, no curvature, ROM normal, no CVA tenderness  Lungs:     Clear to auscultation bilaterally, respirations unlabored  Chest Wall:    No tenderness or deformity   Heart:    Regular rate and rhythm, S1 and S2 normal, no murmur, rub   or gallop  Abdomen:     Soft, non-tender, bowel sounds active all four quadrants,    no masses, no organomegaly  Extremities:   Extremities normal, atraumatic, no cyanosis or edema  Neurologic:   Normal strength, sensation      Assessment/Plan Joesphine pain, 31 year old female with sickle cell anemia, is being admitted to inpatient services due to uncontrolled sickle cell pain, fever, hypokalemia, and tachycardia.  Fever has remained controlled with acetaminophen.  Chest x-ray was unremarkable.  Patient is negative for chest pain, therefore unlikely she is experiencing acute chest.  She has been screened for influenza although recently vaccinated, rapid flu test was negative.  K+ 2.7 previously has increased to K+ 3.0.  Patient was administered K Dur 40 mEq today.  Upon arrival she was tachycardic with a heart rate sustaining between 114 and 116.  Tachycardia resolved after patient's fever was controlled.    Admit - It is my clinical opinion that admission to INPATIENT is reasonable and necessary because this patient will require at least 2 midnights in the hospital to treat this condition based on the medical complexity of the problems presented.  Given the aforementioned information, the predictability of an adverse outcome is felt to be significant.  Plan:  1. Sickle Crisis Pain -Continue High Dosed PCA, Toradal 30 mg IV every 6 hours. Ms Contin 15 mg every 12 hours for long-acting pain control. 2. Hypokalemia-Received 1 dose KDUR 40 meq. Repeat CMP in AM. 3. Fever- resolved with acetaminophen.  PRN acetaminophen ordered. Routine vitals ordered. 4. Tachycardia-resolved, secondary to hypokalemia.   Molli Barrows, FNP 02/17/2017, 4:04 PM

## 2017-02-17 NOTE — Progress Notes (Signed)
erro  neous encounter

## 2017-02-18 DIAGNOSIS — D5700 Hb-SS disease with crisis, unspecified: Principal | ICD-10-CM

## 2017-02-18 DIAGNOSIS — E876 Hypokalemia: Secondary | ICD-10-CM

## 2017-02-18 DIAGNOSIS — J069 Acute upper respiratory infection, unspecified: Secondary | ICD-10-CM

## 2017-02-18 DIAGNOSIS — G894 Chronic pain syndrome: Secondary | ICD-10-CM

## 2017-02-18 LAB — CBC WITH DIFFERENTIAL/PLATELET
BASOS PCT: 1 %
Basophils Absolute: 0.1 10*3/uL (ref 0.0–0.1)
EOS ABS: 0.3 10*3/uL (ref 0.0–0.7)
Eosinophils Relative: 2 %
HCT: 17.7 % — ABNORMAL LOW (ref 36.0–46.0)
HEMOGLOBIN: 6.6 g/dL — AB (ref 12.0–15.0)
LYMPHS PCT: 34 %
Lymphs Abs: 4.4 10*3/uL — ABNORMAL HIGH (ref 0.7–4.0)
MCH: 35.7 pg — AB (ref 26.0–34.0)
MCV: 95.7 fL (ref 78.0–100.0)
Monocytes Absolute: 1.2 10*3/uL — ABNORMAL HIGH (ref 0.1–1.0)
Monocytes Relative: 9 %
NEUTROS ABS: 6.9 10*3/uL (ref 1.7–7.7)
Neutrophils Relative %: 54 %
Platelets: 256 10*3/uL (ref 150–400)
RBC: 1.85 MIL/uL — ABNORMAL LOW (ref 3.87–5.11)
RDW: 21.9 % — ABNORMAL HIGH (ref 11.5–15.5)
WBC: 12.9 10*3/uL — ABNORMAL HIGH (ref 4.0–10.5)

## 2017-02-18 LAB — COMPREHENSIVE METABOLIC PANEL
ALT: 24 U/L (ref 14–54)
AST: 42 U/L — ABNORMAL HIGH (ref 15–41)
Albumin: 3.1 g/dL — ABNORMAL LOW (ref 3.5–5.0)
Alkaline Phosphatase: 79 U/L (ref 38–126)
Anion gap: 3 — ABNORMAL LOW (ref 5–15)
BUN: 11 mg/dL (ref 6–20)
CO2: 22 mmol/L (ref 22–32)
Calcium: 8 mg/dL — ABNORMAL LOW (ref 8.9–10.3)
Chloride: 116 mmol/L — ABNORMAL HIGH (ref 101–111)
Creatinine, Ser: 0.38 mg/dL — ABNORMAL LOW (ref 0.44–1.00)
GFR calc Af Amer: >60 mL/min (ref >60)
GFR calc non Af Amer: >60 mL/min (ref >60)
Glucose, Bld: 160 mg/dL — ABNORMAL HIGH (ref 65–99)
Potassium: 2.8 mmol/L — ABNORMAL LOW (ref 3.5–5.1)
Sodium: 141 mmol/L (ref 135–145)
Total Bilirubin: 2.6 mg/dL — ABNORMAL HIGH (ref 0.3–1.2)
Total Protein: 5.9 g/dL — ABNORMAL LOW (ref 6.5–8.1)

## 2017-02-18 LAB — BASIC METABOLIC PANEL
ANION GAP: 3 — AB (ref 5–15)
BUN: 11 mg/dL (ref 6–20)
CHLORIDE: 116 mmol/L — AB (ref 101–111)
CO2: 21 mmol/L — ABNORMAL LOW (ref 22–32)
Calcium: 8 mg/dL — ABNORMAL LOW (ref 8.9–10.3)
Creatinine, Ser: 0.44 mg/dL (ref 0.44–1.00)
GFR calc non Af Amer: >60 mL/min (ref >60)
Glucose, Bld: 102 mg/dL — ABNORMAL HIGH (ref 65–99)
POTASSIUM: 3.3 mmol/L — AB (ref 3.5–5.1)
SODIUM: 140 mmol/L (ref 135–145)

## 2017-02-18 LAB — LACTATE DEHYDROGENASE: LDH: 253 U/L — ABNORMAL HIGH (ref 98–192)

## 2017-02-18 LAB — MAGNESIUM: Magnesium: 1.6 mg/dL — ABNORMAL LOW (ref 1.7–2.4)

## 2017-02-18 LAB — RETICULOCYTES
RBC.: 1.85 MIL/uL — ABNORMAL LOW (ref 3.87–5.11)
Retic Count, Absolute: 379.3 10*3/uL — ABNORMAL HIGH (ref 19.0–186.0)
Retic Ct Pct: 20.5 % — ABNORMAL HIGH (ref 0.4–3.1)

## 2017-02-18 MED ORDER — SODIUM CHLORIDE 0.9% FLUSH: 10.0000 mL | INTRAVENOUS | Status: DC | PRN

## 2017-02-18 MED ORDER — POTASSIUM CHLORIDE 10 MEQ/100ML IV SOLN
10.0000 meq | INTRAVENOUS | Status: AC
  Administered 2017-02-18 (×2): 10 meq via INTRAVENOUS
  Filled 2017-02-18 (×2): qty 100

## 2017-02-18 MED ORDER — OXYCODONE HCL ER 10 MG PO T12A
10.0000 mg | EXTENDED_RELEASE_TABLET | Freq: Two times a day (BID) | ORAL | Status: DC
  Administered 2017-02-18 – 2017-02-24 (×11): 10 mg via ORAL
  Filled 2017-02-18 (×11): qty 1

## 2017-02-18 MED ORDER — MAGNESIUM SULFATE 2 GM/50ML IV SOLN
2.0000 g | Freq: Once | INTRAVENOUS | Status: AC
  Administered 2017-02-18: 2 g via INTRAVENOUS
  Filled 2017-02-18: qty 50

## 2017-02-18 NOTE — Progress Notes (Signed)
SICKLE CELL SERVICE PROGRESS NOTE  Brittany CampbellMiranda Foster ZOX:[id number] DOB: 05/01/1985 DOA: 02/17/2017 PCP: Bing NeighborsHarris, Kimberly S, FNP  Assessment/Plan: Active Problems:   Sickle cell pain crisis (HCC)   Sickle cell crisis (HCC)   Fever   Cough  1. Hb SS with pain: Continue PCA and  Toradol. Decrease IVF as patient has received IVF at a high rate for the last 2 days.  2. URI: Pt clearly has an upper respiratory infection.She has no lower respiratory symptoms. Continue Symptomatic care. Obtain viral respiratory panel  3. Hypokalemia: replaced by IV and orally. She has frequently been hypomagnesemic and labs from today confirm that her magnesium is low to day.  4. Hypomagnesemia: Will replace by IV. 5. Anemia of Chronic Disease: Baseline Hb is 6.5 -7.5 g/dL. Currently at baseline.  6. Chronic Pain Syndrome: Pt has complex pain and has been frequently hospitalized for pain/crisis. A review of [place] [facility] registry shows that she has received 118 tablets of Oxycodone 5- APAP 3235 mg in the last 30 days. Given the amount of mediation that she has been taking I will prescribe Oxycontin 10 mg every 12 hours.    Code Status: Full Code Family Communication: N/A Disposition Plan: Not yet ready for discharge  [name] A.  Pager [phone]. If 7PM-7AM, please contact night-coverage.  02/18/2017, 4:40 PM  LOS: 1 day   Interim History: Pt reports pain in hips and back both of which are areas of chronic pain for this patient. She has used 47 mg of Dilaudid with 146/96:demands/deliveries since admission.   Consultants:  None  Procedures:  None  Antibiotics:  None    Objective: Vitals:   02/18/17 1010 02/18/17 1206 02/18/17 1538 02/18/17 1545  BP: (!) 95/51  (!) 94/50   Pulse: 89  94   Resp: 18 20 20 13   Temp: 98.2 F (36.8 C)  98.8 F (37.1 C)   TempSrc: Oral  Oral   SpO2: 95% 100% 94% 94%  Weight:       Weight change:   Intake/Output Summary (Last 24 hours) at 02/18/2017  1640 Last data filed at 02/18/2017 0944 Gross per 24 hour  Intake 120 ml  Output -  Net 120 ml     Physical Exam General: Alert, awake, oriented x3, in no acute distress.  HEENT: [place]/AT PEERL, EOMI, anicteric Neck: Trachea midline,  no masses, no thyromegal,y no JVD, no carotid bruit OROPHARYNX:  Moist, No exudate/ erythema/lesions.  Heart: Regular rate and rhythm, without murmurs, rubs, gallops, PMI non-displaced, no heaves or thrills on palpation.  Lungs: Clear to auscultation, no wheezing or rhonchi noted. No increased vocal fremitus resonant to percussion  Abdomen: Soft, nontender, nondistended, positive bowel sounds, no masses no hepatosplenomegaly noted..  Neuro: No focal neurological deficits noted cranial nerves II through XII grossly intact. Strength at baseline in bilateral upper and lower extremities. Musculoskeletal: No warmthPsychiatric: Patient alert and oriented x3, good insight and cognition, good recent to remote recall. Lymph node survey: No cervical axillary or inguinal lymphadenopathy noted.    Data Reviewed: Basic Metabolic Panel: Recent Labs  Lab 02/16/17 1326 02/17/17 1106 02/18/17 0552  NA 138 137 141  K 2.7* 3.0* 2.8*  CL 111 112* 116*  CO2 20* 20* 22  GLUCOSE 123* 117* 160*  BUN 5* 6 11  CREATININE 0.36* 0.33* 0.38*  CALCIUM 8.6* 9.0 8.0*  MG  --   --  1.6*   Liver Function Tests: Recent Labs  Lab 02/16/17 1326 02/17/17 1106 02/18/17 0552  AST  47* 52* 42*  ALT 28 32 24  ALKPHOS 93 102 79  BILITOT 2.7* 3.2* 2.6*  PROT 7.1 7.6 5.9*  ALBUMIN 3.9 4.0 3.1*   No results for input(s): LIPASE, AMYLASE in the last 168 hours. No results for input(s): AMMONIA in the last 168 hours. CBC: Recent Labs  Lab 02/16/17 1326 02/17/17 1106 02/18/17 0552  WBC 16.0* 16.9* 12.9*  NEUTROABS 11.7* 12.6* 6.9  HGB 8.3* 8.4* 6.6*  HCT 23.2* 23.8* 17.7*  MCV 97.1 97.9 95.7  PLT 329 356 256   Cardiac Enzymes: No results for input(s): CKTOTAL, CKMB,  CKMBINDEX, TROPONINI in the last 168 hours. BNP (last 3 results) No results for input(s): BNP in the last 8760 hours.  ProBNP (last 3 results) No results for input(s): PROBNP in the last 8760 hours.  CBG: No results for input(s): GLUCAP in the last 168 hours.  No results found for this or any previous visit (from the past 240 hour(s)).   Studies: Dg Chest 2 View  Result Date: 02/17/2017 CLINICAL DATA:  Chest pain.  Sickle cell crisis. EXAM: CHEST  2 VIEW COMPARISON:  11/14/2016 FINDINGS: The heart size and pulmonary vascularity are normal. Minimal scarring at the left lung base. Lungs are otherwise clear. Right-sided catheter tip is in the superior vena cava, unchanged. No effusions.  No significant bone abnormality. IMPRESSION: No acute abnormality. Electronically Signed   By: Francene BoyersJames  Maxwell M.D.   On: 02/17/2017 13:40    Scheduled Meds: . ARIPiprazole  10 mg Oral Daily  . DULoxetine  60 mg Oral Daily  . folic acid  1 mg Oral Daily  . HYDROmorphone   Intravenous Q4H  . hydroxyurea  1,500 mg Oral Daily  . ketorolac  30 mg Intravenous Q6H  . morphine  15 mg Oral Q12H  . oxyCODONE-acetaminophen  2 tablet Oral Once  . potassium chloride SA  40 mEq Oral Once  . senna-docusate  1 tablet Oral BID  . topiramate  100 mg Oral BID   Continuous Infusions: . dextrose 5 % and 0.45% NaCl 125 mL/hr at 02/17/17 1102  . diphenhydrAMINE (BENADRYL) IVPB(SICKLE CELL ONLY)    . magnesium sulfate 1 - 4 g bolus IVPB 2 g (02/18/17 1634)    Active Problems:   Sickle cell pain crisis (HCC)   Sickle cell crisis (HCC)   Fever   Cough    In excess of 25 minutes spent during this visit. Greater than 50% involved face to face contact with the patient for assessment, counseling and coordination of care.

## 2017-02-19 DIAGNOSIS — R5081 Fever presenting with conditions classified elsewhere: Secondary | ICD-10-CM

## 2017-02-19 LAB — RESPIRATORY PANEL BY PCR
Adenovirus: NOT DETECTED
Bordetella pertussis: NOT DETECTED
CORONAVIRUS OC43-RVPPCR: NOT DETECTED
Chlamydophila pneumoniae: NOT DETECTED
Coronavirus 229E: NOT DETECTED
Coronavirus HKU1: NOT DETECTED
Coronavirus NL63: NOT DETECTED
INFLUENZA A-RVPPCR: NOT DETECTED
INFLUENZA B-RVPPCR: NOT DETECTED
METAPNEUMOVIRUS-RVPPCR: NOT DETECTED
Mycoplasma pneumoniae: NOT DETECTED
PARAINFLUENZA VIRUS 1-RVPPCR: NOT DETECTED
PARAINFLUENZA VIRUS 2-RVPPCR: NOT DETECTED
PARAINFLUENZA VIRUS 3-RVPPCR: NOT DETECTED
PARAINFLUENZA VIRUS 4-RVPPCR: NOT DETECTED
RESPIRATORY SYNCYTIAL VIRUS-RVPPCR: DETECTED — AB
Rhinovirus / Enterovirus: NOT DETECTED

## 2017-02-19 MED ORDER — HYDROMORPHONE HCL 1 MG/ML IJ SOLN
0.5000 mg | Freq: Once | INTRAMUSCULAR | Status: AC
  Administered 2017-02-19: 0.5 mg via INTRAVENOUS
  Filled 2017-02-19: qty 1

## 2017-02-19 MED ORDER — HEPARIN SOD (PORK) LOCK FLUSH 100 UNIT/ML IV SOLN: 500.0000 [IU] | Freq: Once | INTRAVENOUS | Status: DC

## 2017-02-19 NOTE — Progress Notes (Signed)
Called regarding Foster.  Brittany Foster notes she had discussed increased PCA dose with Brittany Foster.  Currently getting 0.5 mg bolus with 10 min lockout with 3 mg limit for 1 hour.  Also getting MS contin 15 mg BID as well as oxycontin 10 mg BID.  There's no note of increasing PCA in Brittany Foster's note, but she notes they had discussed this.  She received 16.5 mg in 12 hours (which is less than maximum currently ordered for her).  For now, will give 0.5 mg dilaudid bolus x 1 and see how she does with this.  Will try to defer changing PCA regimen to sickle cell doc in AM.  VSS, NAD, CTAB, RRR, s/nt/nd.

## 2017-02-19 NOTE — Progress Notes (Signed)
CRITICAL VALUE ALERT  Critical Value:  Positive RSV  Date & Time Notied:  02/19/17 , 1515  Provider Notified: Dr Hyman HopesJegede  Orders Received/Actions taken: yes

## 2017-02-19 NOTE — Progress Notes (Signed)
Patient ID: Brittany CampbellMiranda Foster, female   DOB: 02/28/1986, 31 y.o.   MRN: [id number] Subjective:  Patient is still coughing + low grade fever. She said her throat hurts with repeated coughing. CXR is negative for pneumonia. She denies any nausea or vomiting  Objective:  Vital signs in last 24 hours:  Vitals:   02/19/17 0848 02/19/17 1008 02/19/17 1137 02/19/17 1301  BP:  (!) 92/56  (!) 87/55  Pulse:  75  85  Resp: 14 15 12 14   Temp:  98.6 F (37 C)  98.3 F (36.8 C)  TempSrc:  Oral  Oral  SpO2: 97% 98% 93% 94%  Weight:       Intake/Output from previous day:  Intake/Output Summary (Last 24 hours) at 02/19/2017 1651 Last data filed at 02/19/2017 1008 Gross per 24 hour  Intake 4070 ml  Output 1400 ml  Net 2670 ml   Physical Exam: General: Alert, awake, oriented x3, in no acute distress.  HEENT: [place]/AT PEERL, EOMI Neck: Trachea midline,  no masses, no thyromegal,y no JVD, no carotid bruit OROPHARYNX:  Moist, No exudate/ erythema/lesions.  Heart: Regular rate and rhythm, without murmurs, rubs, gallops, PMI non-displaced, no heaves or thrills on palpation.  Lungs: Clear to auscultation, no wheezing or rhonchi noted. No increased vocal fremitus resonant to percussion  Abdomen: Soft, nontender, nondistended, positive bowel sounds, no masses no hepatosplenomegaly noted..  Neuro: No focal neurological deficits noted cranial nerves II through XII grossly intact. DTRs 2+ bilaterally upper and lower extremities. Strength 5 out of 5 in bilateral upper and lower extremities. Musculoskeletal: No warm swelling or erythema around joints, no spinal tenderness noted. Psychiatric: Patient alert and oriented x3, good insight and cognition, good recent to remote recall. Lymph node survey: No cervical axillary or inguinal lymphadenopathy noted.  Lab Results:  Basic Metabolic Panel:    Component Value Date/Time   NA 140 02/18/2017 2005   K 3.3 (L) 02/18/2017 2005   CL 116 (H) 02/18/2017 2005   CO2 21  (L) 02/18/2017 2005   BUN 11 02/18/2017 2005   CREATININE 0.44 02/18/2017 2005   GLUCOSE 102 (H) 02/18/2017 2005   CALCIUM 8.0 (L) 02/18/2017 2005   CBC:    Component Value Date/Time   WBC 12.9 (H) 02/18/2017 0552   HGB 6.6 (LL) 02/18/2017 0552   HCT 17.7 (L) 02/18/2017 0552   PLT 256 02/18/2017 0552   MCV 95.7 02/18/2017 0552   NEUTROABS 6.9 02/18/2017 0552   LYMPHSABS 4.4 (H) 02/18/2017 0552   MONOABS 1.2 (H) 02/18/2017 0552   EOSABS 0.3 02/18/2017 0552   BASOSABS 0.1 02/18/2017 0552    Recent Results (from the past 240 hour(s))  Respiratory Panel by PCR     Status: Abnormal   Collection Time: 02/19/17 10:39 AM  Result Value Ref Range Status   Adenovirus NOT DETECTED NOT DETECTED Final   Coronavirus 229E NOT DETECTED NOT DETECTED Final   Coronavirus HKU1 NOT DETECTED NOT DETECTED Final   Coronavirus NL63 NOT DETECTED NOT DETECTED Final   Coronavirus OC43 NOT DETECTED NOT DETECTED Final   Metapneumovirus NOT DETECTED NOT DETECTED Final   Rhinovirus / Enterovirus NOT DETECTED NOT DETECTED Final   Influenza A NOT DETECTED NOT DETECTED Final   Influenza B NOT DETECTED NOT DETECTED Final   Parainfluenza Virus 1 NOT DETECTED NOT DETECTED Final   Parainfluenza Virus 2 NOT DETECTED NOT DETECTED Final   Parainfluenza Virus 3 NOT DETECTED NOT DETECTED Final   Parainfluenza Virus 4 NOT DETECTED NOT DETECTED  Final   Respiratory Syncytial Virus DETECTED (A) NOT DETECTED Final    Comment: CRITICAL RESULT CALLED TO, READ BACK BY AND VERIFIED WITH: Jacqlyn LarsenE. Acquaah RN 15:10 02/19/17 ([email or web address])    Bordetella pertussis NOT DETECTED NOT DETECTED Final   Chlamydophila pneumoniae NOT DETECTED NOT DETECTED Final   Mycoplasma pneumoniae NOT DETECTED NOT DETECTED Final    Comment: Performed at [facility], [address] [place], [place] [address]    Studies/Results: No results found.  Medications: Scheduled Meds: . ARIPiprazole  10 mg Oral Daily  . DULoxetine  60 mg Oral Daily  .  folic acid  1 mg Oral Daily  . HYDROmorphone   Intravenous Q4H  . hydroxyurea  1,500 mg Oral Daily  . ketorolac  30 mg Intravenous Q6H  . morphine  15 mg Oral Q12H  . oxyCODONE  10 mg Oral Q12H  . potassium chloride SA  40 mEq Oral Once  . senna-docusate  1 tablet Oral BID  . topiramate  100 mg Oral BID   Continuous Infusions: . dextrose 5 % and 0.45% NaCl 20 mL/hr at 02/18/17 1807  . diphenhydrAMINE (BENADRYL) IVPB(SICKLE CELL ONLY)     PRN Meds:.diphenhydrAMINE **OR** diphenhydrAMINE (BENADRYL) IVPB(SICKLE CELL ONLY), naloxone **AND** sodium chloride flush, ondansetron (ZOFRAN) IV, polyethylene glycol, promethazine **OR** promethazine, sodium chloride flush, traZODone  Consultants:  None  Procedures:  None  Antibiotics:  None  Assessment/Plan: Active Problems:   Sickle cell pain crisis (HCC)   Sickle cell crisis (HCC)   Fever   Cough  1. Hb SS with pain: Continue PCA and  Toradol. Increase IVF back to 100 cc/hr because of fever and + RSV 2. URI: Pt  has an upper respiratory infection with RSV ++. Continue Symptomatic care. Contact isolation precautions+. 3. Hypokalemia: replaced by IV and orally. 4. Hypomagnesemia: Replaced by IV. Check Labs in am 5. Anemia of Chronic Disease: Baseline Hb is 6.5 -7.5 g/dL. Currently at baseline.  6. Chronic Pain Syndrome: Pt has complex pain and has been frequently hospitalized for pain/crisis. Continue Oxycontin 10 mg every 12 hours.   Code Status: Full Code Family Communication: N/A Disposition Plan: Not yet ready for discharge  Brittany Foster  If 7PM-7AM, please contact night-coverage.  02/19/2017, 4:51 PM  LOS: 2 days

## 2017-02-20 LAB — CBC WITH DIFFERENTIAL/PLATELET
BASOS ABS: 0.1 10*3/uL (ref 0.0–0.1)
BASOS PCT: 1 %
EOS PCT: 3 %
Eosinophils Absolute: 0.4 10*3/uL (ref 0.0–0.7)
HEMATOCRIT: 17.3 % — AB (ref 36.0–46.0)
HEMOGLOBIN: 6.3 g/dL — AB (ref 12.0–15.0)
Lymphocytes Relative: 39 %
Lymphs Abs: 5.5 10*3/uL — ABNORMAL HIGH (ref 0.7–4.0)
MCH: 35 pg — ABNORMAL HIGH (ref 26.0–34.0)
MCHC: 36.4 g/dL — ABNORMAL HIGH (ref 30.0–36.0)
MCV: 96.1 fL (ref 78.0–100.0)
MONOS PCT: 7 %
Monocytes Absolute: 1 10*3/uL (ref 0.1–1.0)
NEUTROS ABS: 7 10*3/uL (ref 1.7–7.7)
NEUTROS PCT: 50 %
Platelets: 302 10*3/uL (ref 150–400)
RBC: 1.8 MIL/uL — AB (ref 3.87–5.11)
RDW: 23.5 % — ABNORMAL HIGH (ref 11.5–15.5)
WBC: 14 10*3/uL — AB (ref 4.0–10.5)
nRBC: 6 /100 WBC — ABNORMAL HIGH

## 2017-02-20 LAB — URINE CULTURE: SPECIAL REQUESTS: NORMAL

## 2017-02-20 LAB — BASIC METABOLIC PANEL
Anion gap: 3 — ABNORMAL LOW (ref 5–15)
BUN: 10 mg/dL (ref 6–20)
CHLORIDE: 114 mmol/L — AB (ref 101–111)
CO2: 22 mmol/L (ref 22–32)
CREATININE: 0.48 mg/dL (ref 0.44–1.00)
Calcium: 8.2 mg/dL — ABNORMAL LOW (ref 8.9–10.3)
GFR calc Af Amer: >60 mL/min (ref >60)
GFR calc non Af Amer: >60 mL/min (ref >60)
Glucose, Bld: 104 mg/dL — ABNORMAL HIGH (ref 65–99)
POTASSIUM: 3.2 mmol/L — AB (ref 3.5–5.1)
SODIUM: 139 mmol/L (ref 135–145)

## 2017-02-20 LAB — PHOSPHORUS: PHOSPHORUS: 3.7 mg/dL (ref 2.5–4.6)

## 2017-02-20 LAB — LACTATE DEHYDROGENASE: LDH: 268 U/L — AB (ref 98–192)

## 2017-02-20 LAB — MAGNESIUM: Magnesium: 2 mg/dL (ref 1.7–2.4)

## 2017-02-20 MED ORDER — HYDROMORPHONE 1 MG/ML IV SOLN
INTRAVENOUS | Status: DC
  Administered 2017-02-20: 21:00:00 via INTRAVENOUS
  Administered 2017-02-20: 8.4 mg via INTRAVENOUS
  Administered 2017-02-20: 2.5 mg via INTRAVENOUS
  Administered 2017-02-20: 6 mg via INTRAVENOUS
  Administered 2017-02-21: 4.2 mg via INTRAVENOUS
  Administered 2017-02-21: 3.4 mg via INTRAVENOUS
  Administered 2017-02-21: 9 mg via INTRAVENOUS
  Administered 2017-02-21: 8.4 mg via INTRAVENOUS
  Administered 2017-02-21: 25 mg via INTRAVENOUS
  Administered 2017-02-22: 4.2 mg via INTRAVENOUS
  Administered 2017-02-22: 05:00:00 via INTRAVENOUS
  Administered 2017-02-22: 6 mg via INTRAVENOUS
  Filled 2017-02-20 (×3): qty 25

## 2017-02-20 MED ORDER — DIPHENHYDRAMINE HCL 50 MG/ML IJ SOLN
12.5000 mg | Freq: Once | INTRAMUSCULAR | Status: AC
  Administered 2017-02-20: 12.5 mg via INTRAVENOUS
  Filled 2017-02-20: qty 1

## 2017-02-20 NOTE — Progress Notes (Signed)
CRITICAL VALUE ALERT  Critical Value:  Hgb 6.3  Date & Time Notied: 02/20/17  [id number] Provider Notified: Dr Mikeal HawthorneGarba [phone] Orders Received/Actions taken:  No new orders

## 2017-02-21 NOTE — Progress Notes (Signed)
Patient reports vomiting, showed contents of vomit bag to NT.  Requests iv dose of benadryl.

## 2017-02-21 NOTE — Progress Notes (Signed)
Patient ID: Brittany Foster, female   DOB: 11/24/1985, 31 y.o.   MRN: [id number] Subjective:  Patient is complaining of 10 out of 10 pain and is having significant cough which is productive.  Also sore throat and rhinorrhea.  No fever or chills. She is slightly hypotensive  Objective:  Vital signs in last 24 hours:  Vitals:   02/20/17 2344 02/20/17 2357    BP:  (!) 92/56    Pulse:  80    Resp: 15 10 11    Temp:  98.3 F (36.8 C)    TempSrc:  Oral    SpO2: 93% 94% 95%   Weight:  60.8 kg (134 lb 0.6 oz)     Intake/Output from previous day:  Intake/Output Summary (Last 24 hours) at 02/20/2017 0641 Last data filed at 02/20/2017 0452 Gross per 24 hour  Intake 600 ml  Output 1800 ml  Net -1200 ml   Physical Exam: General: Alert, awake, oriented x3, in no acute distress.  HEENT: [place]/AT PEERL, EOMI Neck: Trachea midline,  no masses, no thyromegal,y no JVD, no carotid bruit OROPHARYNX:  Moist, No exudate/ erythema/lesions.  Heart: Regular rate and rhythm, without murmurs, rubs, gallops, PMI non-displaced, no heaves or thrills on palpation.  Lungs: Clear to auscultation, no wheezing or rhonchi noted. No increased vocal fremitus resonant to percussion  Abdomen: Soft, nontender, nondistended, positive bowel sounds, no masses no hepatosplenomegaly noted..  Neuro: No focal neurological deficits noted cranial nerves II through XII grossly intact. DTRs 2+ bilaterally upper and lower extremities. Strength 5 out of 5 in bilateral upper and lower extremities. Musculoskeletal: No warm swelling or erythema around joints, no spinal tenderness noted. Psychiatric: Patient alert and oriented x3, good insight and cognition, good recent to remote recall. Lymph node survey: No cervical axillary or inguinal lymphadenopathy noted.  Lab Results:  Basic Metabolic Panel:    Component Value Date/Time   NA 139 02/20/2017 0745   K 3.2 (L) 02/20/2017 0745   CL 114 (H) 02/20/2017 0745   CO2 22 02/20/2017 0745    BUN 10 02/20/2017 0745   CREATININE 0.48 02/20/2017 0745   GLUCOSE 104 (H) 02/20/2017 0745   CALCIUM 8.2 (L) 02/20/2017 0745   CBC:    Component Value Date/Time   WBC 14.0 (H) 02/20/2017 0745   HGB 6.3 (LL) 02/20/2017 0745   HCT 17.3 (L) 02/20/2017 0745   PLT 302 02/20/2017 0745   MCV 96.1 02/20/2017 0745   NEUTROABS 7.0 02/20/2017 0745   LYMPHSABS 5.5 (H) 02/20/2017 0745   MONOABS 1.0 02/20/2017 0745   EOSABS 0.4 02/20/2017 0745   BASOSABS 0.1 02/20/2017 0745    Recent Results (from the past 240 hour(s))  Culture, Urine     Status: Abnormal   Collection Time: 02/19/17 12:23 AM  Result Value Ref Range Status   Specimen Description URINE, CLEAN CATCH  Final   Special Requests Normal  Final   Culture MULTIPLE SPECIES PRESENT, SUGGEST RECOLLECTION (A)  Final   Report Status 02/20/2017 FINAL  Final  Respiratory Panel by PCR     Status: Abnormal   Collection Time: 02/19/17 10:39 AM  Result Value Ref Range Status   Adenovirus NOT DETECTED NOT DETECTED Final   Coronavirus 229E NOT DETECTED NOT DETECTED Final   Coronavirus HKU1 NOT DETECTED NOT DETECTED Final   Coronavirus NL63 NOT DETECTED NOT DETECTED Final   Coronavirus OC43 NOT DETECTED NOT DETECTED Final   Metapneumovirus NOT DETECTED NOT DETECTED Final   Rhinovirus / Enterovirus NOT DETECTED NOT  DETECTED Final   Influenza A NOT DETECTED NOT DETECTED Final   Influenza B NOT DETECTED NOT DETECTED Final   Parainfluenza Virus 1 NOT DETECTED NOT DETECTED Final   Parainfluenza Virus 2 NOT DETECTED NOT DETECTED Final   Parainfluenza Virus 3 NOT DETECTED NOT DETECTED Final   Parainfluenza Virus 4 NOT DETECTED NOT DETECTED Final   Respiratory Syncytial Virus DETECTED (A) NOT DETECTED Final    Comment: CRITICAL RESULT CALLED TO, READ BACK BY AND VERIFIED WITH: Jacqlyn LarsenE. Acquaah RN 15:10 02/19/17 ([email or web address])    Bordetella pertussis NOT DETECTED NOT DETECTED Final   Chlamydophila pneumoniae NOT DETECTED NOT DETECTED Final   Mycoplasma  pneumoniae NOT DETECTED NOT DETECTED Final    Comment: Performed at [facility], [address] [place], [place] [address]    Studies/Results: No results found.  Medications: Scheduled Meds: . ARIPiprazole  10 mg Oral Daily  . DULoxetine  60 mg Oral Daily  . folic acid  1 mg Oral Daily  . HYDROmorphone   Intravenous Q4H  . hydroxyurea  1,500 mg Oral Daily  . ketorolac  30 mg Intravenous Q6H  . morphine  15 mg Oral Q12H  . oxyCODONE  10 mg Oral Q12H  . senna-docusate  1 tablet Oral BID  . topiramate  100 mg Oral BID   Continuous Infusions: . dextrose 5 % and 0.45% NaCl 100 mL/hr at 02/21/17 0301  . diphenhydrAMINE (BENADRYL) IVPB(SICKLE CELL ONLY)     PRN Meds:.diphenhydrAMINE **OR** diphenhydrAMINE (BENADRYL) IVPB(SICKLE CELL ONLY), naloxone **AND** sodium chloride flush, ondansetron (ZOFRAN) IV, polyethylene glycol, promethazine **OR** promethazine, sodium chloride flush, traZODone  Consultants:  None  Procedures:  None  Antibiotics:  None  Assessment/Plan: Active Problems:   Sickle cell pain crisis (HCC)   Sickle cell crisis (HCC)   Fever   Cough  1. Hb SS with pain: Patient not getting adequate pain control but also known to have chronic pain.Continue PCA and  Toradol.I will increase the bolus dose to 0.6 mg Dilaudid and maximum of 3.6 mg/h 2. URI: Pt  has an upper respiratory infection with RSV ++. Continue Symptomatic care. Droplet isolation precautions+. 3. Hypokalemia: Continue to replace by IV and orally. 4. Hypomagnesemia: Replaced by IV. Check Labs in am 5. Anemia of Chronic Disease: Baseline Hb is 6.5 -7.5 g/dL. Currently at baseline.  6. Chronic Pain Syndrome: Pt has complex pain and has been frequently hospitalized for pain/crisis. Continue Oxycontin 10 mg every 12 hours.   Code Status: Full Code Family Communication: N/A Disposition Plan: Not yet ready for discharge  Anilah Huck,LAWAL  If 7PM-7AM, please contact night-coverage.  02/20/2017,  6:41 AM  LOS: 4 days

## 2017-02-21 NOTE — Progress Notes (Signed)
Subjective: Patient continues to have pain but has chronic pain.  Still has significant cough and shortness of breath but no fever or chills.  Objective: Vital signs in last 24 hours: Temp:  [98 F (36.7 C)-98.8 F (37.1 C)] 98.7 F (37.1 C) (12/23 0451) Pulse Rate:  [75-83] 75 (12/23 0451) Resp:  [10-17] 13 (12/23 0451) BP: (89-99)/(52-63) 99/63 (12/23 0451) SpO2:  [92 %-98 %] 96 % (12/23 0451) Weight:  [60.8 kg (134 lb 0.6 oz)] 60.8 kg (134 lb 0.6 oz) (12/23 0451) Weight change: 0.018 kg (0.6 oz) Last BM Date: 02/19/17  Intake/Output from previous day: 12/22 0701 - 12/23 0700 In: 600 [P.O.:600] Out: 1800 [Urine:1800] Intake/Output this shift: Total I/O In: -  Out: 900 [Urine:900]  General appearance: alert, cooperative and no distress Head: Normocephalic, without obvious abnormality, atraumatic Neck: no adenopathy, no carotid bruit, no JVD, supple, symmetrical, trachea midline and thyroid not enlarged, symmetric, no tenderness/mass/nodules Back: symmetric, no curvature. ROM normal. No CVA tenderness. Resp: clear to auscultation bilaterally Cardio: regular rate and rhythm, S1, S2 normal, no murmur, click, rub or gallop GI: soft, non-tender; bowel sounds normal; no masses,  no organomegaly Extremities: extremities normal, atraumatic, no cyanosis or edema Pulses: 2+ and symmetric Skin: Skin color, texture, turgor normal. No rashes or lesions  Lab Results: Recent Labs    02/20/17 0745  WBC 14.0*  HGB 6.3*  HCT 17.3*  PLT 302   BMET Recent Labs    02/18/17 2005 02/20/17 0745  NA 140 139  K 3.3* 3.2*  CL 116* 114*  CO2 21* 22  GLUCOSE 102* 104*  BUN 11 10  CREATININE 0.44 0.48  CALCIUM 8.0* 8.2*    Studies/Results: No results found.  Medications: I have reviewed the patient's current medications.  Assessment/Plan: #1 Upper Respiratory Tract infection: Patient has RSV.  We will continue supportive care.  No antibiotics.  IV fluids as well   #2 sickle  cell painful crisis: Patient on Dilaudid PCA was Toradol and IV fluids.  No change in current regimen  #3 hypokalemia: We will continue to replete potassium.  #4 chronic pain syndrome: We will continue home regimen.  We'll be careful on medications for the patient especially IV pain medicine increments.   LOS: 4 days   Seaton Hofmann,LAWAL 02/21/2017, 6:41 AM

## 2017-02-21 NOTE — Progress Notes (Signed)
Report given to Diana.

## 2017-02-22 DIAGNOSIS — R05 Cough: Secondary | ICD-10-CM

## 2017-02-22 DIAGNOSIS — D638 Anemia in other chronic diseases classified elsewhere: Secondary | ICD-10-CM

## 2017-02-22 DIAGNOSIS — J210 Acute bronchiolitis due to respiratory syncytial virus: Secondary | ICD-10-CM

## 2017-02-22 LAB — BASIC METABOLIC PANEL
Anion gap: 3 — ABNORMAL LOW (ref 5–15)
BUN: 6 mg/dL (ref 6–20)
CHLORIDE: 117 mmol/L — AB (ref 101–111)
CO2: 21 mmol/L — ABNORMAL LOW (ref 22–32)
CREATININE: 0.51 mg/dL (ref 0.44–1.00)
Calcium: 8 mg/dL — ABNORMAL LOW (ref 8.9–10.3)
GFR calc Af Amer: >60 mL/min (ref >60)
GLUCOSE: 107 mg/dL — AB (ref 65–99)
POTASSIUM: 3.4 mmol/L — AB (ref 3.5–5.1)
SODIUM: 141 mmol/L (ref 135–145)

## 2017-02-22 LAB — CBC WITH DIFFERENTIAL/PLATELET
BASOS PCT: 0 %
BLASTS: 0 %
Band Neutrophils: 0 %
Basophils Absolute: 0 10*3/uL (ref 0.0–0.1)
Eosinophils Absolute: 0 10*3/uL (ref 0.0–0.7)
Eosinophils Relative: 0 %
HEMATOCRIT: 18.7 % — AB (ref 36.0–46.0)
HEMOGLOBIN: 6.7 g/dL — AB (ref 12.0–15.0)
LYMPHS PCT: 38 %
Lymphs Abs: 4.4 10*3/uL — ABNORMAL HIGH (ref 0.7–4.0)
MCH: 36.2 pg — AB (ref 26.0–34.0)
MCHC: 35.8 g/dL (ref 30.0–36.0)
MCV: 101.1 fL — AB (ref 78.0–100.0)
Metamyelocytes Relative: 1 %
Monocytes Absolute: 0.2 10*3/uL (ref 0.1–1.0)
Monocytes Relative: 2 %
Myelocytes: 0 %
NEUTROS PCT: 59 %
NRBC: 24 /100{WBCs} — AB
Neutro Abs: 7.1 10*3/uL (ref 1.7–7.7)
OTHER: 0 %
PROMYELOCYTES ABS: 0 %
Platelets: 328 10*3/uL (ref 150–400)
RBC: 1.85 MIL/uL — AB (ref 3.87–5.11)
RDW: 26.9 % — ABNORMAL HIGH (ref 11.5–15.5)
WBC: 11.7 10*3/uL — ABNORMAL HIGH (ref 4.0–10.5)

## 2017-02-22 LAB — RETICULOCYTES
RBC.: 1.85 MIL/uL — ABNORMAL LOW (ref 3.87–5.11)
Retic Count, Absolute: 362.6 10*3/uL — ABNORMAL HIGH (ref 19.0–186.0)
Retic Ct Pct: 19.6 % — ABNORMAL HIGH (ref 0.4–3.1)

## 2017-02-22 MED ORDER — HYDROMORPHONE 1 MG/ML IV SOLN
INTRAVENOUS | Status: DC
  Administered 2017-02-22: 7.2 mg via INTRAVENOUS
  Administered 2017-02-22: 5.5 mg via INTRAVENOUS
  Administered 2017-02-23: 1 mg via INTRAVENOUS
  Administered 2017-02-23: 6 mg via INTRAVENOUS
  Administered 2017-02-23: 10.5 mg via INTRAVENOUS
  Administered 2017-02-23: 03:00:00 via INTRAVENOUS
  Administered 2017-02-23: 6.5 mg via INTRAVENOUS
  Administered 2017-02-23: 7.5 mg via INTRAVENOUS
  Administered 2017-02-23: 5 mg via INTRAVENOUS
  Administered 2017-02-23: 19:00:00 via INTRAVENOUS
  Administered 2017-02-23: 4 mg via INTRAVENOUS
  Administered 2017-02-24: 4.5 mg via INTRAVENOUS
  Administered 2017-02-24: 7 mg via INTRAVENOUS
  Administered 2017-02-24: 4 mg via INTRAVENOUS
  Administered 2017-02-24: 13:00:00 via INTRAVENOUS
  Filled 2017-02-22 (×3): qty 25

## 2017-02-22 MED ORDER — FUROSEMIDE 10 MG/ML IJ SOLN
40.0000 mg | Freq: Once | INTRAMUSCULAR | Status: AC
  Administered 2017-02-22: 40 mg via INTRAVENOUS
  Filled 2017-02-22: qty 4

## 2017-02-22 MED ORDER — OXYCODONE HCL 5 MG PO TABS
10.0000 mg | ORAL_TABLET | ORAL | Status: DC | PRN
  Administered 2017-02-23 (×2): 10 mg via ORAL
  Filled 2017-02-22 (×2): qty 2

## 2017-02-22 MED ORDER — OXYCODONE HCL 5 MG PO TABS: 10.0000 mg | ORAL_TABLET | ORAL | Status: DC

## 2017-02-22 MED ORDER — GUAIFENESIN-DM 100-10 MG/5ML PO SYRP
5.0000 mL | ORAL_SOLUTION | ORAL | Status: DC | PRN
  Administered 2017-02-22: 5 mL via ORAL
  Filled 2017-02-22: qty 10

## 2017-02-22 NOTE — Progress Notes (Signed)
SICKLE CELL SERVICE PROGRESS NOTE  Brittany Foster ZOX:[id number] DOB: [birth year] DOA: 02/17/2017 PCP: Brittany Neighbors, FNP  Assessment/Plan: Active Problems:   Sickle cell pain crisis (HCC)   Sickle cell crisis (HCC)   Fever   Cough  1. Hb SS with pain: Wean PCA and continue Toradol through today then discontinue per order. Decrease IVF to [facility].  2. URI: Respiratory Viral Panel positive for RSV. Supportive care and droplet precautions.   3. Hypokalemia: Continue oral replacement. 4. Hypomagnesemia: Replaced by IV.  5. Anemia of Chronic Disease: Baseline Hb is 6.5 -7.5 g/dL. Currently at baseline.  6. Chronic Pain Syndrome: Pt has complex pain and has been frequently hospitalized for pain/crisis. A review of [place] Opiate registry shows that she has received 118 tablets of Oxycodone 5- APAP 3235 mg in the last 34 days. Continue Oxycontin 10 mg every 12 hours. Will plan to give taper of OxyContin at time of discharge.    Code Status: Full Code Family Communication: N/A Disposition Plan: Anticipate discharge tomorrow.   Brittany Foster A.  Pager [phone]. If 7PM-7AM, please contact night-coverage.  02/22/2017, 3:15 PM  LOS: 5 days   Interim History: Pt reports pain in chest wall with coughing. She has used 34.8 mg of Dilaudid with 74/50:demands/deliveries in the last 24 hours.   Consultants:  None  Procedures:  None  Antibiotics:  None    Objective: Vitals:   02/22/17 0411 02/22/17 0502 02/22/17 0817 02/22/17 1201  BP:  94/63    Pulse:  80    Resp: 18 14 12 13   Temp:  98.7 F (37.1 C)    TempSrc:  Oral    SpO2: 97% 98% 98% 95%  Weight:       Weight change:   Intake/Output Summary (Last 24 hours) at 02/22/2017 1515 Last data filed at 02/22/2017 0600 Gross per 24 hour  Intake 2306.66 ml  Output 850 ml  Net 1456.66 ml     Physical Exam General: Alert, awake, oriented x3, in no acute distress.  HEENT: [place]/AT PEERL, EOMI, anicteric Neck: Trachea midline,   no masses, no thyromegal,y no JVD, no carotid bruit OROPHARYNX:  Moist, No exudate/ erythema/lesions.  Heart: Regular rate and rhythm, without murmurs, rubs, gallops, PMI non-displaced, no heaves or thrills on palpation.  Lungs: Clear to auscultation except for crackles at B/L bases. No wheezing or rhonchi noted. No increased vocal fremitus resonant to percussion  Abdomen: Soft, nontender, nondistended, positive bowel sounds, no masses no hepatosplenomegaly noted..  Neuro: No focal neurological deficits noted cranial nerves II through XII grossly intact. Strength at baseline in bilateral upper and lower extremities. Musculoskeletal: No warmth, swelling or erythema around joints. No spinal tenderness noted.  Psychiatric: Patient alert and oriented x3, good insight and cognition, good recent to remote recall.   Data Reviewed: Basic Metabolic Panel: Recent Labs  Lab 02/17/17 1106 02/18/17 0552 02/18/17 2005 02/20/17 0745 02/22/17 1313  NA 137 141 140 139 141  K 3.0* 2.8* 3.3* 3.2* 3.4*  CL 112* 116* 116* 114* 117*  CO2 20* 22 21* 22 21*  GLUCOSE 117* 160* 102* 104* 107*  BUN 6 11 11 10 6   CREATININE 0.33* 0.38* 0.44 0.48 0.51  CALCIUM 9.0 8.0* 8.0* 8.2* 8.0*  MG  --  1.6*  --  2.0  --   PHOS  --   --   --  3.7  --    Liver Function Tests: Recent Labs  Lab 02/16/17 1326 02/17/17 1106 02/18/17 0552  AST 47*  52* 42*  ALT 28 32 24  ALKPHOS 93 102 79  BILITOT 2.7* 3.2* 2.6*  PROT 7.1 7.6 5.9*  ALBUMIN 3.9 4.0 3.1*   No results for input(s): LIPASE, AMYLASE in the last 168 hours. No results for input(s): AMMONIA in the last 168 hours. CBC: Recent Labs  Lab 02/16/17 1326 02/17/17 1106 02/18/17 0552 02/20/17 0745 02/22/17 1313  WBC 16.0* 16.9* 12.9* 14.0* 11.7*  NEUTROABS 11.7* 12.6* 6.9 7.0 7.1  HGB 8.3* 8.4* 6.6* 6.3* 6.7*  HCT 23.2* 23.8* 17.7* 17.3* 18.7*  MCV 97.1 97.9 95.7 96.1 101.1*  PLT 329 356 256 302 328   Cardiac Enzymes: No results for input(s): CKTOTAL,  CKMB, CKMBINDEX, TROPONINI in the last 168 hours. BNP (last 3 results) No results for input(s): BNP in the last 8760 hours.  ProBNP (last 3 results) No results for input(s): PROBNP in the last 8760 hours.  CBG: No results for input(s): GLUCAP in the last 168 hours.  Recent Results (from the past 240 hour(s))  Culture, Urine     Status: Abnormal   Collection Time: 02/19/17 12:23 AM  Result Value Ref Range Status   Specimen Description URINE, CLEAN CATCH  Final   Special Requests Normal  Final   Culture MULTIPLE SPECIES PRESENT, SUGGEST RECOLLECTION (A)  Final   Report Status 02/20/2017 FINAL  Final  Respiratory Panel by PCR     Status: Abnormal   Collection Time: 02/19/17 10:39 AM  Result Value Ref Range Status   Adenovirus NOT DETECTED NOT DETECTED Final   Coronavirus 229E NOT DETECTED NOT DETECTED Final   Coronavirus HKU1 NOT DETECTED NOT DETECTED Final   Coronavirus NL63 NOT DETECTED NOT DETECTED Final   Coronavirus OC43 NOT DETECTED NOT DETECTED Final   Metapneumovirus NOT DETECTED NOT DETECTED Final   Rhinovirus / Enterovirus NOT DETECTED NOT DETECTED Final   Influenza A NOT DETECTED NOT DETECTED Final   Influenza B NOT DETECTED NOT DETECTED Final   Parainfluenza Virus 1 NOT DETECTED NOT DETECTED Final   Parainfluenza Virus 2 NOT DETECTED NOT DETECTED Final   Parainfluenza Virus 3 NOT DETECTED NOT DETECTED Final   Parainfluenza Virus 4 NOT DETECTED NOT DETECTED Final   Respiratory Syncytial Virus DETECTED (A) NOT DETECTED Final    Comment: CRITICAL RESULT CALLED TO, READ BACK BY AND VERIFIED WITH: Brittany LarsenE. Acquaah RN 15:10 02/19/17 ([email or web address])    Bordetella pertussis NOT DETECTED NOT DETECTED Final   Chlamydophila pneumoniae NOT DETECTED NOT DETECTED Final   Mycoplasma pneumoniae NOT DETECTED NOT DETECTED Final    Comment: Performed at [facility], [address] [place], [place] [address]     Studies: Dg Chest 2 View  Result Date: 02/17/2017 CLINICAL DATA:  Chest  pain.  Sickle cell crisis. EXAM: CHEST  2 VIEW COMPARISON:  11/14/2016 FINDINGS: The heart size and pulmonary vascularity are normal. Minimal scarring at the left lung base. Lungs are otherwise clear. Right-sided catheter tip is in the superior vena cava, unchanged. No effusions.  No significant bone abnormality. IMPRESSION: No acute abnormality. Electronically Signed   By: Francene BoyersJames  Maxwell M.D.   On: 02/17/2017 13:40    Scheduled Meds: . ARIPiprazole  10 mg Oral Daily  . DULoxetine  60 mg Oral Daily  . folic acid  1 mg Oral Daily  . HYDROmorphone   Intravenous Q4H  . hydroxyurea  1,500 mg Oral Daily  . ketorolac  30 mg Intravenous Q6H  . morphine  15 mg Oral Q12H  . oxyCODONE  10 mg Oral Q12H  . senna-docusate  1 tablet Oral BID  . topiramate  100 mg Oral BID   Continuous Infusions: . dextrose 5 % and 0.45% NaCl 100 mL/hr at 02/22/17 [id number]    Active Problems:   Sickle cell pain crisis (HCC)   Sickle cell crisis (HCC)   Fever   Cough    In excess of 25 minutes spent during this visit. Greater than 50% involved face to face contact with the patient for assessment, counseling and coordination of care.

## 2017-02-22 NOTE — Progress Notes (Signed)
Date:  February 22, 2017 Chart reviewed for concurrent status and case management needs.  Will continue to follow patient progress.  Discharge Planning: following for needs  Expected discharge date: December 247 2018  Rhonda Davis, BSN, RN3, CCM   [phone]  

## 2017-02-22 NOTE — Progress Notes (Addendum)
AMB note:  Oxygen sat prior to amb: 95% Pulse:98  Oxygen sat post amb:98% Pulse:101

## 2017-02-23 DIAGNOSIS — J208 Acute bronchitis due to other specified organisms: Secondary | ICD-10-CM

## 2017-02-23 LAB — MAGNESIUM: MAGNESIUM: 1.9 mg/dL (ref 1.7–2.4)

## 2017-02-23 LAB — BASIC METABOLIC PANEL
ANION GAP: 4 — AB (ref 5–15)
BUN: 8 mg/dL (ref 6–20)
CHLORIDE: 116 mmol/L — AB (ref 101–111)
CO2: 22 mmol/L (ref 22–32)
Calcium: 8.5 mg/dL — ABNORMAL LOW (ref 8.9–10.3)
Creatinine, Ser: 0.66 mg/dL (ref 0.44–1.00)
GFR calc non Af Amer: >60 mL/min (ref >60)
Glucose, Bld: 138 mg/dL — ABNORMAL HIGH (ref 65–99)
Potassium: 3.2 mmol/L — ABNORMAL LOW (ref 3.5–5.1)
Sodium: 142 mmol/L (ref 135–145)

## 2017-02-23 MED ORDER — SODIUM CHLORIDE 0.9 % IV SOLN
25.0000 mg | INTRAVENOUS | Status: DC | PRN
  Administered 2017-02-23: 25 mg via INTRAVENOUS
  Filled 2017-02-23: qty 0.5

## 2017-02-23 NOTE — Progress Notes (Signed)
Pt requests IV benadryl. States she is unable to keep anything down and unable to take PO meds. Will page MD.

## 2017-02-23 NOTE — Progress Notes (Signed)
SICKLE CELL SERVICE PROGRESS NOTE  [name]:[id number] DOB: 08/02/1985 DOA: 02/17/2017 PCP: Bing NeighborsHarris, Kimberly S, FNP  Assessment/Plan: Active Problems:   Sickle cell pain crisis (HCC)   Sickle cell crisis (HCC)   Fever   Cough  1. Hb SS with pain: Continue to wean PCA. Continue Oxycodone 10 mg every 4 hours PRN and OxyCONTIN.  2. URI: Respiratory Viral Panel positive for RSV. Supportive care and droplet precautions.   3. Hypokalemia: Continue oral replacement. Recheck potassium today. 4. Hypomagnesemia: Replaced by IV. Recheck today. 5. Anemia of Chronic Disease: Baseline Hb is 6.5 -7.5 g/dL. Currently at baseline.  6. Chronic Pain Syndrome: Pt has complex pain and has been frequently hospitalized for pain/crisis. A review of [place] Opiate registry shows that she has received 118 tablets of Oxycodone 5- APAP 3235 mg in the last 34 days. Continue Oxycontin 10 mg every 12 hours. Will plan to give taper of OxyContin at time of discharge.   7. Disposition: Pt was supposed to be discharged today but has no medications at home and has no access to Pharmacy to obtain medications today given that it is christmas day. Will discharge tomorrow.    Code Status: Full Code Family Communication: N/A Disposition Plan: Anticipate discharge tomorrow.   Brittany Foster A.  Pager [phone]. If 7PM-7AM, please contact night-coverage.  02/23/2017, 2:44 PM  LOS: 6 days   Interim History: Pt reports pain in chest wall with coughing. She has used 34.8 mg of Dilaudid with 74/50:demands/deliveries in the last 24 hours. I have reviewed that PMP Aware [place] and she has received 118 tabs of Percocet with in 30 days. She states that she has no medications for pain management at home. Pt up ambulating.   Consultants:  None  Procedures:  None  Antibiotics:  None    Objective: Vitals:   02/23/17 1059 02/23/17 1124 02/23/17 1420 02/23/17 1436  BP:  (!) 92/56  (!) 96/57  Pulse:  78  87  Resp: 16 16  15 15   Temp:  98.8 F (37.1 C)  97.9 F (36.6 C)  TempSrc:  Oral  Oral  SpO2: 96% 97% 92% 97%  Weight:       Weight change:   Intake/Output Summary (Last 24 hours) at 02/23/2017 1444 Last data filed at 02/23/2017 0941 Gross per 24 hour  Intake 1921.5 ml  Output 700 ml  Net 1221.5 ml     Physical Exam General: Alert, awake, oriented x3, in no acute distress.  HEENT: [place]/AT PEERL, EOMI, anicteric  Heart: Regular rate and rhythm, without murmurs, rubs, gallops, PMI non-displaced, no heaves or thrills on palpation.  Lungs: Clear to auscultation except for crackles at B/L bases. No wheezing or rhonchi noted. No increased vocal fremitus resonant to percussion  Abdomen: Soft, nontender, nondistended, positive bowel sounds, no masses no hepatosplenomegaly noted. Neuro: No focal neurological deficits noted cranial nerves II through XII grossly intact. Strength at baseline in bilateral upper and lower extremities. Musculoskeletal: No warmth, swelling or erythema around joints. No spinal tenderness noted.  Psychiatric: Patient alert and oriented x3, good insight and cognition, good recent to remote recall.   Data Reviewed: Basic Metabolic Panel: Recent Labs  Lab 02/17/17 1106 02/18/17 0552 02/18/17 2005 02/20/17 0745 02/22/17 1313  NA 137 141 140 139 141  K 3.0* 2.8* 3.3* 3.2* 3.4*  CL 112* 116* 116* 114* 117*  CO2 20* 22 21* 22 21*  GLUCOSE 117* 160* 102* 104* 107*  BUN 6 11 11 10 6   CREATININE  0.33* 0.38* 0.44 0.48 0.51  CALCIUM 9.0 8.0* 8.0* 8.2* 8.0*  MG  --  1.6*  --  2.0  --   PHOS  --   --   --  3.7  --    Liver Function Tests: Recent Labs  Lab 02/17/17 1106 02/18/17 0552  AST 52* 42*  ALT 32 24  ALKPHOS 102 79  BILITOT 3.2* 2.6*  PROT 7.6 5.9*  ALBUMIN 4.0 3.1*   No results for input(s): LIPASE, AMYLASE in the last 168 hours. No results for input(s): AMMONIA in the last 168 hours. CBC: Recent Labs  Lab 02/17/17 1106 02/18/17 0552 02/20/17 0745  02/22/17 1313  WBC 16.9* 12.9* 14.0* 11.7*  NEUTROABS 12.6* 6.9 7.0 7.1  HGB 8.4* 6.6* 6.3* 6.7*  HCT 23.8* 17.7* 17.3* 18.7*  MCV 97.9 95.7 96.1 101.1*  PLT 356 256 302 328   Cardiac Enzymes: No results for input(s): CKTOTAL, CKMB, CKMBINDEX, TROPONINI in the last 168 hours. BNP (last 3 results) No results for input(s): BNP in the last 8760 hours.  ProBNP (last 3 results) No results for input(s): PROBNP in the last 8760 hours.  CBG: No results for input(s): GLUCAP in the last 168 hours.  Recent Results (from the past 240 hour(s))  Culture, Urine     Status: Abnormal   Collection Time: 02/19/17 12:23 AM  Result Value Ref Range Status   Specimen Description URINE, CLEAN CATCH  Final   Special Requests Normal  Final   Culture MULTIPLE SPECIES PRESENT, SUGGEST RECOLLECTION (A)  Final   Report Status 02/20/2017 FINAL  Final  Respiratory Panel by PCR     Status: Abnormal   Collection Time: 02/19/17 10:39 AM  Result Value Ref Range Status   Adenovirus NOT DETECTED NOT DETECTED Final   Coronavirus 229E NOT DETECTED NOT DETECTED Final   Coronavirus HKU1 NOT DETECTED NOT DETECTED Final   Coronavirus NL63 NOT DETECTED NOT DETECTED Final   Coronavirus OC43 NOT DETECTED NOT DETECTED Final   Metapneumovirus NOT DETECTED NOT DETECTED Final   Rhinovirus / Enterovirus NOT DETECTED NOT DETECTED Final   Influenza A NOT DETECTED NOT DETECTED Final   Influenza B NOT DETECTED NOT DETECTED Final   Parainfluenza Virus 1 NOT DETECTED NOT DETECTED Final   Parainfluenza Virus 2 NOT DETECTED NOT DETECTED Final   Parainfluenza Virus 3 NOT DETECTED NOT DETECTED Final   Parainfluenza Virus 4 NOT DETECTED NOT DETECTED Final   Respiratory Syncytial Virus DETECTED (A) NOT DETECTED Final    Comment: CRITICAL RESULT CALLED TO, READ BACK BY AND VERIFIED WITH: Jacqlyn Larsen RN 15:10 02/19/17 ([email or web address])    Bordetella pertussis NOT DETECTED NOT DETECTED Final   Chlamydophila pneumoniae NOT DETECTED NOT  DETECTED Final   Mycoplasma pneumoniae NOT DETECTED NOT DETECTED Final    Comment: Performed at [facility], [address], [place], [place] [address]     Studies: Dg Chest 2 View  Result Date: 02/17/2017 CLINICAL DATA:  Chest pain.  Sickle cell crisis. EXAM: CHEST  2 VIEW COMPARISON:  11/14/2016 FINDINGS: The heart size and pulmonary vascularity are normal. Minimal scarring at the left lung base. Lungs are otherwise clear. Right-sided catheter tip is in the superior vena cava, unchanged. No effusions.  No significant bone abnormality. IMPRESSION: No acute abnormality. Electronically Signed   By: Francene Boyers M.D.   On: 02/17/2017 13:40    Scheduled Meds: . ARIPiprazole  10 mg Oral Daily  . DULoxetine  60 mg Oral Daily  .  folic acid  1 mg Oral Daily  . HYDROmorphone   Intravenous Q4H  . hydroxyurea  1,500 mg Oral Daily  . morphine  15 mg Oral Q12H  . oxyCODONE  10 mg Oral Q12H  . senna-docusate  1 tablet Oral BID  . topiramate  100 mg Oral BID   Continuous Infusions: . dextrose 5 % and 0.45% NaCl 10 mL (02/22/17 1521)    Active Problems:   Sickle cell pain crisis (HCC)   Sickle cell crisis (HCC)   Fever   Cough    In excess of 25 minutes spent during this visit. Greater than 50% involved face to face contact with the patient for assessment, counseling and coordination of care.

## 2017-02-24 DIAGNOSIS — I9589 Other hypotension: Secondary | ICD-10-CM

## 2017-02-24 LAB — CBC WITH DIFFERENTIAL/PLATELET
Basophils Absolute: 0 10*3/uL (ref 0.0–0.1)
Basophils Relative: 0 %
EOS ABS: 0.3 10*3/uL (ref 0.0–0.7)
EOS PCT: 3 %
HCT: 19.4 % — ABNORMAL LOW (ref 36.0–46.0)
HEMOGLOBIN: 7 g/dL — AB (ref 12.0–15.0)
Lymphocytes Relative: 30 %
Lymphs Abs: 3.1 10*3/uL (ref 0.7–4.0)
MCH: 37.8 pg — ABNORMAL HIGH (ref 26.0–34.0)
MCHC: 36.1 g/dL — AB (ref 30.0–36.0)
MCV: 104.9 fL — ABNORMAL HIGH (ref 78.0–100.0)
MONO ABS: 0.1 10*3/uL (ref 0.1–1.0)
MONOS PCT: 1 %
Myelocytes: 1 %
NEUTROS ABS: 6.8 10*3/uL (ref 1.7–7.7)
NEUTROS PCT: 65 %
PLATELETS: 293 10*3/uL (ref 150–400)
RBC: 1.85 MIL/uL — AB (ref 3.87–5.11)
RDW: 26.2 % — ABNORMAL HIGH (ref 11.5–15.5)
WBC: 10.3 10*3/uL (ref 4.0–10.5)
nRBC: 35 /100 WBC — ABNORMAL HIGH

## 2017-02-24 LAB — RETICULOCYTES
RBC.: 1.85 MIL/uL — AB (ref 3.87–5.11)
RETIC CT PCT: 17.2 % — AB (ref 0.4–3.1)
Retic Count, Absolute: 318.2 10*3/uL — ABNORMAL HIGH (ref 19.0–186.0)

## 2017-02-24 MED ORDER — OXYCODONE-ACETAMINOPHEN 10-325 MG PO TABS: 1.0000 | ORAL_TABLET | ORAL | 0 refills | Status: DC | PRN

## 2017-02-24 MED ORDER — POTASSIUM CHLORIDE CRYS ER 20 MEQ PO TBCR: 40.0000 meq | EXTENDED_RELEASE_TABLET | Freq: Every day | ORAL | 0 refills | Status: DC

## 2017-02-24 MED ORDER — PROMETHAZINE HCL 25 MG PO TABS: 12.5000 mg | ORAL_TABLET | ORAL | Status: DC | PRN

## 2017-02-24 MED ORDER — OXYCODONE HCL ER 10 MG PO T12A: EXTENDED_RELEASE_TABLET | ORAL | 0 refills | Status: DC

## 2017-02-24 MED ORDER — HEPARIN SOD (PORK) LOCK FLUSH 100 UNIT/ML IV SOLN
500.0000 [IU] | Freq: Once | INTRAVENOUS | Status: DC
  Filled 2017-02-24: qty 5

## 2017-02-24 MED ORDER — PROMETHAZINE HCL 25 MG RE SUPP: 12.5000 mg | RECTAL | Status: DC | PRN

## 2017-02-24 NOTE — Discharge Summary (Signed)
Brittany Foster MRN: [id number] DOB/AGE: 03/25/1985 31 y.o.  Admit date: 02/17/2017 Discharge date: 02/24/2017  Primary Care Physician:  Bing Neighbors, FNP   Discharge Diagnoses:   Patient Active Problem List   Diagnosis Date Noted  . Hypotension 01/26/2016  . Opiate dependence (HCC) 01/23/2016  . MDD (major depressive disorder), recurrent severe, without psychosis (HCC) 01/10/2016  . Vitamin D deficiency 01/08/2015  . Chronic pain syndrome 01/08/2015  . Hypokalemia 08/21/2014  . 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 Anemia 02/09/2014  . Neuropathy (HCC) 02/09/2014    DISCHARGE MEDICATION: Allergies as of 02/24/2017      Reactions   Ultram [tramadol] Other (See Comments)   Reaction:  Seizures    Zofran [ondansetron Hcl] Nausea And Vomiting   Buprenorphine Hcl Hives, Other (See Comments)   Reaction:  Shaking    Fentanyl Hives   Morphine And Related Hives, Other (See Comments)   Reaction:  Shaking    Tape Rash      Medication List    STOP taking these medications   oxyCODONE 5 MG immediate release tablet Commonly known as:  Oxy IR/ROXICODONE Replaced by:  oxyCODONE 10 mg 12 hr tablet     TAKE these medications   ARIPiprazole 10 MG tablet Commonly known as:  ABILIFY Take 10 mg by mouth daily   DULoxetine 60 MG capsule Commonly known as:  CYMBALTA TAKE 1 CAPSULE BY MOUTH DAILY.   folic acid 1 MG tablet Commonly known as:  FOLVITE Take 1 tablet (1 mg total) by mouth daily.   hydroxyurea 500 MG capsule Commonly known as:  HYDREA TAKE 2 CAPSULES BY MOUTH DAILY. MAY TAKE WITH FOOD TO MINIMIZE GI SIDE EFFECTS. What changed:    how much to take  how to take this  when to take this  additional instructions   ibuprofen 200 MG tablet Commonly known as:  ADVIL,MOTRIN Take 400 mg by mouth every 6 (six) hours as needed.   lactulose 20 g packet Commonly known as:  CEPHULAC Take 1 packet (20 g total) by mouth 3 (three) times daily as needed.    medroxyPROGESTERone 150 MG/ML injection Commonly known as:  DEPO-PROVERA Inject 150 mg into the muscle every 3 (three) months.   methocarbamol 500 MG tablet Commonly known as:  ROBAXIN Take 1 tablet (500 mg total) by mouth 4 (four) times daily.   oxyCODONE 10 mg 12 hr tablet Commonly known as:  OXYCONTIN 10 mg PO q 12 hours x 2 day then 10 mg q HS x 3 days then stop Replaces:  oxyCODONE 5 MG immediate release tablet   oxyCODONE-acetaminophen 10-325 MG tablet Commonly known as:  PERCOCET Take 1 tablet by mouth every 4 (four) hours as needed for pain.   potassium chloride SA 20 MEQ tablet Commonly known as:  K-DUR,KLOR-CON Take 2 tablets (40 mEq total) by mouth daily. What changed:  how much to take   Topiramate ER 100 MG Cp24 Commonly known as:  TROKENDI XR Take 200 mg by mouth at bedtime. What changed:  how much to take   traZODone 50 MG tablet Commonly known as:  DESYREL Take 0.5-1 tablets (25-50 mg total) by mouth at bedtime as needed for sleep.         Consults: Treatment Team:  Altha Harm, MD   SIGNIFICANT DIAGNOSTIC STUDIES:  Dg Chest 2 View  Result Date: 02/17/2017 CLINICAL DATA:  Chest pain.  Sickle cell crisis. EXAM: CHEST  2 VIEW COMPARISON:  11/14/2016 FINDINGS: The  heart size and pulmonary vascularity are normal. Minimal scarring at the left lung base. Lungs are otherwise clear. Right-sided catheter tip is in the superior vena cava, unchanged. No effusions.  No significant bone abnormality. IMPRESSION: No acute abnormality. Electronically Signed   By: Francene Boyers M.D.   On: 02/17/2017 13:40      Recent Results (from the past 240 hour(s))  Culture, Urine     Status: Abnormal   Collection Time: 02/19/17 12:23 AM  Result Value Ref Range Status   Specimen Description URINE, CLEAN CATCH  Final   Special Requests Normal  Final   Culture MULTIPLE SPECIES PRESENT, SUGGEST RECOLLECTION (A)  Final   Report Status 02/20/2017 FINAL  Final   Respiratory Panel by PCR     Status: Abnormal   Collection Time: 02/19/17 10:39 AM  Result Value Ref Range Status   Adenovirus NOT DETECTED NOT DETECTED Final   Coronavirus 229E NOT DETECTED NOT DETECTED Final   Coronavirus HKU1 NOT DETECTED NOT DETECTED Final   Coronavirus NL63 NOT DETECTED NOT DETECTED Final   Coronavirus OC43 NOT DETECTED NOT DETECTED Final   Metapneumovirus NOT DETECTED NOT DETECTED Final   Rhinovirus / Enterovirus NOT DETECTED NOT DETECTED Final   Influenza A NOT DETECTED NOT DETECTED Final   Influenza B NOT DETECTED NOT DETECTED Final   Parainfluenza Virus 1 NOT DETECTED NOT DETECTED Final   Parainfluenza Virus 2 NOT DETECTED NOT DETECTED Final   Parainfluenza Virus 3 NOT DETECTED NOT DETECTED Final   Parainfluenza Virus 4 NOT DETECTED NOT DETECTED Final   Respiratory Syncytial Virus DETECTED (A) NOT DETECTED Final    Comment: CRITICAL RESULT CALLED TO, READ BACK BY AND VERIFIED WITH: Jacqlyn Larsen RN 15:10 02/19/17 ([email or web address])    Bordetella pertussis NOT DETECTED NOT DETECTED Final   Chlamydophila pneumoniae NOT DETECTED NOT DETECTED Final   Mycoplasma pneumoniae NOT DETECTED NOT DETECTED Final    Comment: Performed at [facility], [address], [place], [place] [address]    BRIEF ADMITTING H & P: Nashaly Dorantes a 31 y.o.femalewith a diagnosis of Sickle Cell Anemia, presented to the day infusion center today with a complaint of herworsening generalized pain and fatigue. Anayiah was treated in the day infusion center on 02/16/2017 for sickle cell pain crisis and was found to have K+ 2.7, leucocytosis 16.0, and low-grade fever. She was advised to return today for further evaluation and observation.[place]orsening pain all over, fatigue, and worsening non-productive-cough overnight. Elaina was febrile upon arrival in the day hospital, tachycardic, and complaining of 8/10 generalized pain. Chest x-ray and influenza testing were negative. Pain only  mildly improved 7/10 with Toradol 30 mg and a total delivery of  Hydromorphone 6.75 mg, and continuous IV hydration. Afebrile after receipt of acetaminophen 650 mg. At present, she deniesheadache, shortness of breath, chest pain, dysuria, nausea, vomiting, or diarrhea.      Hospital Course:  Present on Admission: . Hypokalemia . Hb-SS disease without crisis (HCC) . Chronic pain syndrome  This is a patient with homozygous sickle cell anemia who was admitted to the hospital after she was seen in the clinic with symptoms of an upper respiratory infection.  It was felt that the upper respiratory infection triggered a sickle cell crisis and thus the patient was admitted to the hospital both for the upper respiratory infection as well as a sickle cell crisis.  Further investigation while hospitalized revealed the patient was suffering from a rhinovirus acute bronchitis and had an acute viral syndrome.  She was treated supportively with IV fluids, antipyretics and antitussives medications.  At the time of discharge the patient had no fever for several days and her cough was minimal.  For her sickle cell crisis she was treated with IV Dilaudid via the PCA.  Based on the patient's tolerance and her usage patient's the outpatient setting OxyContin was also initiated for management of her pain.  At the time of discharge the patient is ambulatory without any difficulty.  She continues to rate her pain as 6-7/10 however at this level of pain does not impede her function.  The patient is being discharged home on a tapering dose of the OxyContin as well as Percocet 10-3 25 to be used on an as-needed basis.  Prescriptions have been given for OxyContin 10 mg # 7 pills and Percocet 10-325 mg # 30 pills.    The patient was also noted to have a low hemoglobin on admission at a value of 3.  The potassium went down to a nidus of 2.8 and it was noted that her magnesium was also low at 1.6.  Both the potassium and the  magnesium were replaced orally and by IV.  At the time of discharge her magnesium is 1.9 and potassium is marginally low at 3.2.  She has been given a prescription for potassium supplementation 40 mEq daily.  And is to follow-up in 1 week with her primary provider to assess her potassium levels.  Given the chronic nature of her low potassium as noted by review of records she deserves a dedicated evaluation for hypokalemia including urine studies and possibly a referral to nephrology.  Patient does have chronic pain and had previously been on long-acting opiate.  However currently she does not have a dedicated prescriber for her opiates and has been obtaining medications from acute care settings which she has been using on a continuous basis.  This patient is at risk for withdrawal from opiates given that she does not have someone to write his prescriptions.  I suspect that she will continue to present acutely for combination of crisis as well as withdrawal symptoms.  The patient is on a lock-in program with her provider for opiates however she has called the program and change the authorizing name to my name Marcelino DusterMichelle in [place].  I have verify that by call the program myself thus her prescription should be covered when she goes to the pharmacy.  Please note however that I do not manage this patient's pain on a chronic basis so after the prescription has been filled she will have to call and correct that information so that she can have her chronic provider in the outpatient setting listed as the authorizing physician.  Disposition and Follow-up: The patient is discharged in good condition.  She is to follow-up with her primary provider in 1 week. Discharge Instructions    Activity as tolerated - No restrictions   Complete by:  As directed    Diet general   Complete by:  As directed       DISCHARGE EXAM:  General: Alert, awake, oriented x  3 and in no apparent distress.  HEENT: [place]/AT PEERL, EOMI,  anicteric Neck: Trachea midline, no masses, no thyromegal,y no JVD, no carotid bruit OROPHARYNX: Moist, No exudate/ erythema/lesions.  Heart: Regular rate and rhythm, without murmurs, rubs, gallops or S3. PMI non-displaced. Exam reveals no decreased pulses. Pulmonary/Chest: Normal effort. Breath sounds normal. No. Apnea. Clear to auscultation,no stridor,  no wheezing and no rhonchi  noted. No respiratory distress and no tenderness noted. Abdomen: Soft, nontender, nondistended, normal bowel sounds, no masses no hepatosplenomegaly noted. No fluid wave and no ascites. There is no guarding or rebound. Neuro: Alert and oriented to person, place and time. Normal motor skills, Displays no atrophy or tremors and exhibits normal muscle tone.  No focal neurological deficits noted cranial nerves II through XII grossly intact. No sensory deficit noted.  Strength at baseline in bilateral upper and lower extremities. Gait normal. Musculoskeletal: No warmth swelling or erythema around joints, no spinal tenderness noted. Psychiatric: Patient alert and oriented x3, good insight and cognition, good recent to remote recall. Skin: Skin is warm and dry. No bruising, no ecchymosis and no rash noted. Pt is not diaphoretic. No erythema. No pallor    Blood pressure (!) 99/58, pulse 81, temperature 99 F (37.2 C), temperature source Oral, resp. rate 13, weight 60.3 kg (132 lb 14.4 oz), SpO2 98 %.  Recent Labs    02/22/17 1313 02/23/17 1616  NA 141 142  K 3.4* 3.2*  CL 117* 116*  CO2 21* 22  GLUCOSE 107* 138*  BUN 6 8  CREATININE 0.51 0.66  CALCIUM 8.0* 8.5*  MG  --  1.9   No results for input(s): AST, ALT, ALKPHOS, BILITOT, PROT, ALBUMIN in the last 72 hours. No results for input(s): LIPASE, AMYLASE in the last 72 hours. Recent Labs    02/22/17 1313 02/24/17 0400  WBC 11.7* 10.3  NEUTROABS 7.1 6.8  HGB 6.7* 7.0*  HCT 18.7* 19.4*  MCV 101.1* 104.9*  PLT 328 293     Total time spent including face  to face and decision making was greater than 30 minutes  Signed: Kaianna Dolezal A. 02/24/2017, 2:02 PM

## 2017-02-26 ENCOUNTER — Ambulatory Visit: Payer: Self-pay | Admitting: Family Medicine

## 2017-03-04 ENCOUNTER — Telehealth: Payer: Self-pay

## 2017-03-04 MED ORDER — METHOCARBAMOL 500 MG PO TABS: 500.0000 mg | ORAL_TABLET | Freq: Four times a day (QID) | ORAL | 0 refills | Status: DC

## 2017-03-04 NOTE — Telephone Encounter (Signed)
Refilled robaxin 

## 2017-03-05 ENCOUNTER — Telehealth (HOSPITAL_COMMUNITY): Payer: Self-pay | Admitting: *Deleted

## 2017-03-05 NOTE — Telephone Encounter (Signed)
Patient called requesting to be seen in the day hospital. Patient reports pain in upper abdomin, legs and hips rated 8/10. Selena BattenKim, FNP notified and advised that patient be seen in ED due to abdominal pain. Patient notified of providers recommendation. Patient expresses an understanding.

## 2017-03-07 ENCOUNTER — Encounter (HOSPITAL_COMMUNITY): Payer: Self-pay | Admitting: *Deleted

## 2017-03-07 ENCOUNTER — Other Ambulatory Visit: Payer: Self-pay

## 2017-03-07 ENCOUNTER — Telehealth: Payer: Self-pay

## 2017-03-07 ENCOUNTER — Inpatient Hospital Stay (HOSPITAL_COMMUNITY)
Admission: AD | Admit: 2017-03-07 | Discharge: 2017-03-11 | DRG: 314 | Disposition: A | Discharge status: Home / Self Care | Payer: Medicaid Other | Source: Other Acute Inpatient Hospital | Attending: Internal Medicine | Admitting: Internal Medicine

## 2017-03-07 DIAGNOSIS — Z91048 Other nonmedicinal substance allergy status: Secondary | ICD-10-CM | POA: Diagnosis not present

## 2017-03-07 DIAGNOSIS — T80219S Unspecified infection due to central venous catheter, sequela: Secondary | ICD-10-CM | POA: Diagnosis not present

## 2017-03-07 DIAGNOSIS — Z9049 Acquired absence of other specified parts of digestive tract: Secondary | ICD-10-CM | POA: Diagnosis not present

## 2017-03-07 DIAGNOSIS — D571 Sickle-cell disease without crisis: Secondary | ICD-10-CM | POA: Diagnosis present

## 2017-03-07 DIAGNOSIS — F1120 Opioid dependence, uncomplicated: Secondary | ICD-10-CM | POA: Diagnosis present

## 2017-03-07 DIAGNOSIS — Z79899 Other long term (current) drug therapy: Secondary | ICD-10-CM

## 2017-03-07 DIAGNOSIS — F418 Other specified anxiety disorders: Secondary | ICD-10-CM | POA: Diagnosis present

## 2017-03-07 DIAGNOSIS — Y848 Other medical procedures as the cause of abnormal reaction of the patient, or of later complication, without mention of misadventure at the time of the procedure: Secondary | ICD-10-CM | POA: Diagnosis present

## 2017-03-07 DIAGNOSIS — D638 Anemia in other chronic diseases classified elsewhere: Secondary | ICD-10-CM | POA: Diagnosis present

## 2017-03-07 DIAGNOSIS — Z888 Allergy status to other drugs, medicaments and biological substances status: Secondary | ICD-10-CM

## 2017-03-07 DIAGNOSIS — A419 Sepsis, unspecified organism: Secondary | ICD-10-CM | POA: Diagnosis present

## 2017-03-07 DIAGNOSIS — F1123 Opioid dependence with withdrawal: Secondary | ICD-10-CM | POA: Diagnosis present

## 2017-03-07 DIAGNOSIS — Z832 Family history of diseases of the blood and blood-forming organs and certain disorders involving the immune mechanism: Secondary | ICD-10-CM | POA: Diagnosis not present

## 2017-03-07 DIAGNOSIS — T8579XA Infection and inflammatory reaction due to other internal prosthetic devices, implants and grafts, initial encounter: Secondary | ICD-10-CM

## 2017-03-07 DIAGNOSIS — Z885 Allergy status to narcotic agent status: Secondary | ICD-10-CM

## 2017-03-07 DIAGNOSIS — R509 Fever, unspecified: Secondary | ICD-10-CM | POA: Diagnosis not present

## 2017-03-07 DIAGNOSIS — E876 Hypokalemia: Secondary | ICD-10-CM | POA: Diagnosis present

## 2017-03-07 DIAGNOSIS — T80211A Bloodstream infection due to central venous catheter, initial encounter: Principal | ICD-10-CM | POA: Diagnosis present

## 2017-03-07 DIAGNOSIS — F17210 Nicotine dependence, cigarettes, uncomplicated: Secondary | ICD-10-CM | POA: Diagnosis present

## 2017-03-07 DIAGNOSIS — F54 Psychological and behavioral factors associated with disorders or diseases classified elsewhere: Secondary | ICD-10-CM | POA: Diagnosis not present

## 2017-03-07 DIAGNOSIS — F339 Major depressive disorder, recurrent, unspecified: Secondary | ICD-10-CM | POA: Diagnosis present

## 2017-03-07 DIAGNOSIS — R42 Dizziness and giddiness: Secondary | ICD-10-CM | POA: Diagnosis present

## 2017-03-07 DIAGNOSIS — G894 Chronic pain syndrome: Secondary | ICD-10-CM | POA: Diagnosis present

## 2017-03-07 DIAGNOSIS — T80219D Unspecified infection due to central venous catheter, subsequent encounter: Secondary | ICD-10-CM | POA: Diagnosis not present

## 2017-03-07 DIAGNOSIS — D5700 Hb-SS disease with crisis, unspecified: Secondary | ICD-10-CM | POA: Diagnosis not present

## 2017-03-07 DIAGNOSIS — F1124 Opioid dependence with opioid-induced mood disorder: Secondary | ICD-10-CM | POA: Diagnosis not present

## 2017-03-07 DIAGNOSIS — I9589 Other hypotension: Secondary | ICD-10-CM | POA: Diagnosis present

## 2017-03-07 LAB — MRSA PCR SCREENING: MRSA by PCR: NEGATIVE

## 2017-03-07 LAB — CREATININE, SERUM
CREATININE: 0.4 mg/dL — AB (ref 0.44–1.00)
GFR calc Af Amer: >60 mL/min (ref >60)
GFR calc non Af Amer: >60 mL/min (ref >60)

## 2017-03-07 LAB — RETICULOCYTES
RBC.: 2.18 MIL/uL — AB (ref 3.87–5.11)
RETIC CT PCT: 16 % — AB (ref 0.4–3.1)
Retic Count, Absolute: 348.8 10*3/uL — ABNORMAL HIGH (ref 19.0–186.0)

## 2017-03-07 MED ORDER — PIPERACILLIN-TAZOBACTAM 3.375 G IVPB
3.3750 g | Freq: Three times a day (TID) | INTRAVENOUS | Status: DC
  Administered 2017-03-07 – 2017-03-10 (×8): 3.375 g via INTRAVENOUS
  Filled 2017-03-07 (×8): qty 50

## 2017-03-07 MED ORDER — ENOXAPARIN SODIUM 40 MG/0.4ML ~~LOC~~ SOLN
40.0000 mg | SUBCUTANEOUS | Status: DC
  Filled 2017-03-07 (×2): qty 0.4

## 2017-03-07 MED ORDER — HYDROXYUREA 500 MG PO CAPS
1500.0000 mg | ORAL_CAPSULE | Freq: Every day | ORAL | Status: DC
  Administered 2017-03-08 – 2017-03-11 (×4): 1500 mg via ORAL
  Filled 2017-03-07 (×4): qty 3

## 2017-03-07 MED ORDER — ARIPIPRAZOLE 10 MG PO TABS
10.0000 mg | ORAL_TABLET | Freq: Every day | ORAL | Status: DC
  Administered 2017-03-08 – 2017-03-11 (×4): 10 mg via ORAL
  Filled 2017-03-07 (×5): qty 1

## 2017-03-07 MED ORDER — POTASSIUM CHLORIDE CRYS ER 20 MEQ PO TBCR
40.0000 meq | EXTENDED_RELEASE_TABLET | Freq: Every day | ORAL | Status: DC
  Administered 2017-03-08 – 2017-03-09 (×2): 40 meq via ORAL
  Administered 2017-03-11: 20 meq via ORAL
  Filled 2017-03-07 (×4): qty 2

## 2017-03-07 MED ORDER — DIPHENHYDRAMINE HCL 12.5 MG/5ML PO ELIX: 12.5000 mg | ORAL_SOLUTION | Freq: Four times a day (QID) | ORAL | Status: DC | PRN

## 2017-03-07 MED ORDER — LACTULOSE 10 GM/15ML PO SOLN
20.0000 g | Freq: Three times a day (TID) | ORAL | Status: DC
  Filled 2017-03-07: qty 30

## 2017-03-07 MED ORDER — DIPHENHYDRAMINE HCL 50 MG/ML IJ SOLN
12.5000 mg | Freq: Four times a day (QID) | INTRAMUSCULAR | Status: DC | PRN
  Administered 2017-03-08: 12.5 mg via INTRAVENOUS
  Filled 2017-03-07: qty 1

## 2017-03-07 MED ORDER — METHOCARBAMOL 500 MG PO TABS
500.0000 mg | ORAL_TABLET | Freq: Four times a day (QID) | ORAL | Status: DC
  Administered 2017-03-08 – 2017-03-10 (×5): 500 mg via ORAL
  Filled 2017-03-07 (×9): qty 1

## 2017-03-07 MED ORDER — SODIUM CHLORIDE 0.9% FLUSH: 9.0000 mL | INTRAVENOUS | Status: DC | PRN

## 2017-03-07 MED ORDER — TOPIRAMATE 25 MG PO TABS
50.0000 mg | ORAL_TABLET | Freq: Two times a day (BID) | ORAL | Status: DC
  Administered 2017-03-07 – 2017-03-11 (×7): 50 mg via ORAL
  Filled 2017-03-07 (×8): qty 2

## 2017-03-07 MED ORDER — FOLIC ACID 1 MG PO TABS
1.0000 mg | ORAL_TABLET | Freq: Every day | ORAL | Status: DC
  Administered 2017-03-08 – 2017-03-11 (×4): 1 mg via ORAL
  Filled 2017-03-07 (×5): qty 1

## 2017-03-07 MED ORDER — KETOROLAC TROMETHAMINE 30 MG/ML IJ SOLN
30.0000 mg | Freq: Four times a day (QID) | INTRAMUSCULAR | Status: DC
  Administered 2017-03-07 – 2017-03-08 (×4): 30 mg via INTRAVENOUS
  Filled 2017-03-07 (×4): qty 1

## 2017-03-07 MED ORDER — HYDROMORPHONE 1 MG/ML IV SOLN
INTRAVENOUS | Status: DC
  Administered 2017-03-07: 20:00:00 via INTRAVENOUS
  Administered 2017-03-08: 10 mg via INTRAVENOUS
  Administered 2017-03-08: 11 mg via INTRAVENOUS
  Administered 2017-03-08: 7 mg via INTRAVENOUS
  Administered 2017-03-08: 8 mg via INTRAVENOUS
  Administered 2017-03-08: 07:00:00 via INTRAVENOUS
  Administered 2017-03-08: 5 mg via INTRAVENOUS
  Filled 2017-03-07 (×2): qty 25

## 2017-03-07 MED ORDER — DULOXETINE HCL 60 MG PO CPEP
60.0000 mg | ORAL_CAPSULE | Freq: Every day | ORAL | Status: DC
  Administered 2017-03-08 – 2017-03-11 (×4): 60 mg via ORAL
  Filled 2017-03-07 (×5): qty 1

## 2017-03-07 MED ORDER — NALOXONE HCL 0.4 MG/ML IJ SOLN: 0.4000 mg | INTRAMUSCULAR | Status: DC | PRN

## 2017-03-07 MED ORDER — VANCOMYCIN HCL IN DEXTROSE 1-5 GM/200ML-% IV SOLN
1000.0000 mg | INTRAVENOUS | Status: DC
  Administered 2017-03-07: 1000 mg via INTRAVENOUS
  Filled 2017-03-07: qty 200

## 2017-03-07 MED ORDER — OXYCODONE HCL ER 10 MG PO T12A
10.0000 mg | EXTENDED_RELEASE_TABLET | Freq: Two times a day (BID) | ORAL | Status: DC
  Administered 2017-03-07 – 2017-03-11 (×8): 10 mg via ORAL
  Filled 2017-03-07 (×9): qty 1

## 2017-03-07 MED ORDER — TRAZODONE HCL 50 MG PO TABS: 25.0000 mg | ORAL_TABLET | Freq: Every evening | ORAL | Status: DC | PRN

## 2017-03-07 MED ORDER — SENNOSIDES-DOCUSATE SODIUM 8.6-50 MG PO TABS
1.0000 | ORAL_TABLET | Freq: Two times a day (BID) | ORAL | Status: DC
  Administered 2017-03-08 – 2017-03-11 (×4): 1 via ORAL
  Filled 2017-03-07 (×6): qty 1

## 2017-03-07 MED ORDER — PROMETHAZINE HCL 25 MG/ML IJ SOLN: 12.5000 mg | Freq: Four times a day (QID) | INTRAMUSCULAR | Status: DC | PRN

## 2017-03-07 MED ORDER — POLYETHYLENE GLYCOL 3350 17 G PO PACK: 17.0000 g | PACK | Freq: Every day | ORAL | Status: DC | PRN

## 2017-03-07 MED ORDER — ONDANSETRON HCL 4 MG/2ML IJ SOLN: 4.0000 mg | Freq: Four times a day (QID) | INTRAMUSCULAR | Status: DC | PRN

## 2017-03-07 NOTE — Plan of Care (Signed)
  Progressing Education: Knowledge of vaso-occlusive preventative measures will improve 03/07/2017 1831 - Progressing by Shirleen Schirmerraegbunam, Eytan Carrigan K, RN Pain Managment: General experience of comfort will improve 03/07/2017 1831 - Progressing by Shirleen Schirmerraegbunam, Aeralyn Barna K, RN Safety: Ability to remain free from injury will improve 03/07/2017 1831 - Progressing by Shirleen Schirmerraegbunam, Jamecia Lerman K, RN

## 2017-03-07 NOTE — H&P (Signed)
Brittany Foster is an 32 y.o. female.   Chief Complaint: fever chills HPI: patient is a 32 year old female with history of sickle cell disease who was transferred from the [facility] at [facility] was suspicion of infected PICC line and early sepsis.  She reported to the hospital that was temperature 103 and mild chills.  Patient was in the hospital being treated for the infection as well as his chronic pain secondary to sickle cell disease.  Patient was determined to have possible catheter infection however they do not have interventional radiologist over the weekend or at all so the patient could have her catheter removed. The PICC line is located in her right anterior chest wall and was inserted by interventional radiology here.  She was therefore transferred for treatment.  So far blood cultures over the remained negative.  Patient's temperature on arrival here is 38F.  Past Medical History:  Diagnosis Date  . Anemia   . Depression, major, recurrent (HCC)   . Migraines   . Sickle cell anemia (HCC)     Past Surgical History:  Procedure Laterality Date  . CESAREAN SECTION    . CHOLECYSTECTOMY  2000  . IR GENERIC HISTORICAL  10/08/2015   IR US GUIDE VASC ACCESS RIGHT 10/08/2015 Simonne Come, MD [facility]-INTERV RAD  . IR GENERIC HISTORICAL  10/08/2015   IR FLUORO GUIDE CV LINE RIGHT 10/08/2015 Simonne Come, MD [facility]INTER[facility]  . MULTIPLE TOOTH EXTRACTIONS N/A   . port a cath placement Right    about 6-7 years ago  . removal of porta cath Right 09/11/15  . TUBAL LIGATION      Family History  Problem Relation Age of Onset  . Sickle cell trait Father   . Sickle cell trait Mother   . Sickle cell anemia Other    Social History:  reports that she has been smoking.  She has been smoking about 1.00 pack per day. She uses smokeless tobacco. She reports that she does not drink alcohol or use drugs.  Allergies:  Allergies  Allergen Reactions  . Ultram [Tramadol] Other (See Comments)    Reaction:  Seizures    . Zofran [Ondansetron Hcl] Nausea And Vomiting  . Buprenorphine Hcl Hives and Other (See Comments)    Reaction:  Shaking   . Fentanyl Hives  . Morphine And Related Hives and Other (See Comments)    Reaction:  Shaking   . Tape Rash    Medications Prior to Admission  Medication Sig Dispense Refill  . ARIPiprazole (ABILIFY) 10 MG tablet Take 10 mg by mouth daily  3  . DULoxetine (CYMBALTA) 60 MG capsule TAKE 1 CAPSULE BY MOUTH DAILY. 30 capsule 3  . folic acid (FOLVITE) 1 MG tablet Take 1 tablet (1 mg total) by mouth daily. 90 tablet 3  . hydroxyurea (HYDREA) 500 MG capsule TAKE 2 CAPSULES BY MOUTH DAILY. MAY TAKE WITH FOOD TO MINIMIZE GI SIDE EFFECTS. (Patient taking differently: Take 1,500 mg by mouth daily. ) 180 capsule 3  . ibuprofen (ADVIL,MOTRIN) 200 MG tablet Take 400 mg by mouth every 6 (six) hours as needed.    . lactulose (CEPHULAC) 20 g packet Take 1 packet (20 g total) by mouth 3 (three) times daily as needed. 30 each 0  . medroxyPROGESTERone (DEPO-PROVERA) 150 MG/ML injection Inject 150 mg into the muscle every 3 (three) months.     . methocarbamol (ROBAXIN) 500 MG tablet Take 1 tablet (500 mg total) by mouth 4 (four) times daily. 120 tablet 0  .  oxyCODONE (OXYCONTIN) 10 mg 12 hr tablet 10 mg PO q 12 hours x 2 day then 10 mg q HS x 3 days then stop 7 tablet 0  . oxyCODONE-acetaminophen (PERCOCET) 10-325 MG tablet Take 1 tablet by mouth every 4 (four) hours as needed for pain. 30 tablet 0  . potassium chloride SA (K-DUR,KLOR-CON) 20 MEQ tablet Take 2 tablets (40 mEq total) by mouth daily. 30 tablet 0  . Topiramate ER 100 MG CP24 Take 200 mg by mouth at bedtime. (Patient taking differently: Take 100 mg by mouth at bedtime. ) 60 capsule 3  . traZODone (DESYREL) 50 MG tablet Take 0.5-1 tablets (25-50 mg total) by mouth at bedtime as needed for sleep. 30 tablet 1    No results found for this or any previous visit (from the past 48 hour(s)). No results found.  Review of Systems   Constitutional: Positive for fever.  HENT: Negative.   Eyes: Negative.   Respiratory: Negative.   Cardiovascular: Negative.   Gastrointestinal: Negative.   Genitourinary: Negative.   Musculoskeletal: Positive for back pain and myalgias.  Skin: Negative.   Neurological: Negative.   Endo/Heme/Allergies: Negative.   Psychiatric/Behavioral: Negative.     There were no vitals taken for this visit. Physical Exam  Constitutional: She is oriented to person, place, and time. She appears well-developed and well-nourished.  HENT:  Head: Normocephalic and atraumatic.  Eyes: Conjunctivae are normal. Pupils are equal, round, and reactive to light.  Neck: Normal range of motion. Neck supple.  Cardiovascular: Normal rate, regular rhythm and normal heart sounds.  Respiratory: Effort normal and breath sounds normal.  GI: Soft. Bowel sounds are normal.  Musculoskeletal: Normal range of motion.  Neurological: She is alert and oriented to person, place, and time.  Skin: Skin is warm and dry. There is erythema.  Psychiatric: She has a normal mood and affect.     Assessment/Plan 32 year old female with suspected PICC line infection  #1 sepsis: patient so far has negative blood cultures.  We will repeat blood cultures.  Empirically continue antibiotics IV.  PICC line will be removed by interventional radiology in the morning.  #2 sickle cell disease: Patient is not in significant crisis but has chronic pain.  She complained of 7 out of 10 pain.  I will put her on the Dilaudid PCA with her chronic pain medications. Hold ibuprofen and start Toradol  #3 chronic pain syndrome: continue OxyContin while in the hospital  #4 Depression with anxiety: patient is on Cymbalta.  Continue home regimen  Leena Tiede,LAWAL, MD 03/07/2017, 6:17 PM

## 2017-03-08 ENCOUNTER — Encounter (HOSPITAL_COMMUNITY): Payer: Self-pay

## 2017-03-08 DIAGNOSIS — E876 Hypokalemia: Secondary | ICD-10-CM

## 2017-03-08 DIAGNOSIS — D638 Anemia in other chronic diseases classified elsewhere: Secondary | ICD-10-CM

## 2017-03-08 DIAGNOSIS — D5700 Hb-SS disease with crisis, unspecified: Secondary | ICD-10-CM

## 2017-03-08 DIAGNOSIS — G894 Chronic pain syndrome: Secondary | ICD-10-CM

## 2017-03-08 DIAGNOSIS — R509 Fever, unspecified: Secondary | ICD-10-CM

## 2017-03-08 LAB — CBC WITH DIFFERENTIAL/PLATELET
Basophils Absolute: 0 10*3/uL (ref 0.0–0.1)
Basophils Relative: 0 %
Eosinophils Absolute: 0 10*3/uL (ref 0.0–0.7)
Eosinophils Relative: 0 %
HEMATOCRIT: 22.4 % — AB (ref 36.0–46.0)
HEMOGLOBIN: 7.9 g/dL — AB (ref 12.0–15.0)
LYMPHS ABS: 2.8 10*3/uL (ref 0.7–4.0)
Lymphocytes Relative: 29 %
MCH: 36.2 pg — ABNORMAL HIGH (ref 26.0–34.0)
MCHC: 35.3 g/dL (ref 30.0–36.0)
MCV: 102.8 fL — ABNORMAL HIGH (ref 78.0–100.0)
MONO ABS: 0.7 10*3/uL (ref 0.1–1.0)
MONOS PCT: 7 %
NRBC: 6 /100{WBCs} — AB
Neutro Abs: 6 10*3/uL (ref 1.7–7.7)
Neutrophils Relative %: 64 %
Platelets: 247 10*3/uL (ref 150–400)
RBC: 2.18 MIL/uL — AB (ref 3.87–5.11)
RDW: 21.5 % — ABNORMAL HIGH (ref 11.5–15.5)
WBC: 9.5 10*3/uL (ref 4.0–10.5)

## 2017-03-08 MED ORDER — SODIUM CHLORIDE 0.9 % IV SOLN
INTRAVENOUS | Status: DC
Start: ? — End: 2017-03-08

## 2017-03-08 MED ORDER — ARIPIPRAZOLE 5 MG PO TABS
10.00 | ORAL_TABLET | ORAL | Status: DC
Start: 2017-03-08 — End: 2017-03-08

## 2017-03-08 MED ORDER — GENERIC EXTERNAL MEDICATION
1.00 | Status: DC
Start: 2017-03-08 — End: 2017-03-08

## 2017-03-08 MED ORDER — DULOXETINE HCL 30 MG PO CPEP
60.00 | ORAL_CAPSULE | ORAL | Status: DC
Start: 2017-03-08 — End: 2017-03-08

## 2017-03-08 MED ORDER — OXYCODONE HCL 5 MG PO TABS
10.0000 mg | ORAL_TABLET | ORAL | Status: DC
  Administered 2017-03-08 – 2017-03-10 (×8): 10 mg via ORAL
  Filled 2017-03-08 (×9): qty 2

## 2017-03-08 MED ORDER — TRAZODONE HCL 100 MG PO TABS
50.00 | ORAL_TABLET | ORAL | Status: DC
Start: ? — End: 2017-03-08

## 2017-03-08 MED ORDER — DIPHENHYDRAMINE HCL 50 MG/ML IJ SOLN
25.00 | INTRAMUSCULAR | Status: DC
Start: ? — End: 2017-03-08

## 2017-03-08 MED ORDER — DIPHENHYDRAMINE HCL 25 MG PO CAPS: 25.0000 mg | ORAL_CAPSULE | ORAL | Status: DC | PRN

## 2017-03-08 MED ORDER — LACTULOSE 10 GM/15ML PO SOLN
20.00 | ORAL | Status: DC
Start: ? — End: 2017-03-08

## 2017-03-08 MED ORDER — ONDANSETRON HCL 4 MG/2ML IJ SOLN: 4.0000 mg | Freq: Four times a day (QID) | INTRAMUSCULAR | Status: DC | PRN

## 2017-03-08 MED ORDER — SODIUM CHLORIDE 0.9 % IV SOLN
1250.0000 mg | Freq: Two times a day (BID) | INTRAVENOUS | Status: DC
  Administered 2017-03-08 – 2017-03-09 (×2): 1250 mg via INTRAVENOUS
  Filled 2017-03-08 (×3): qty 1250

## 2017-03-08 MED ORDER — HYDROMORPHONE HCL 1 MG/ML IJ SOLN
1.00 | INTRAMUSCULAR | Status: DC
Start: ? — End: 2017-03-08

## 2017-03-08 MED ORDER — TOPIRAMATE 25 MG PO TABS
50.00 | ORAL_TABLET | ORAL | Status: DC
Start: 2017-03-07 — End: 2017-03-08

## 2017-03-08 MED ORDER — GENERIC EXTERNAL MEDICATION
2.00 | Status: DC
Start: 2017-03-07 — End: 2017-03-08

## 2017-03-08 MED ORDER — ACETAMINOPHEN 325 MG PO TABS
650.00 | ORAL_TABLET | ORAL | Status: DC
Start: ? — End: 2017-03-08

## 2017-03-08 MED ORDER — NALOXONE HCL 0.4 MG/ML IJ SOLN: 0.4000 mg | INTRAMUSCULAR | Status: DC | PRN

## 2017-03-08 MED ORDER — HYDROMORPHONE HCL 2 MG/ML IJ SOLN
2.00 | INTRAMUSCULAR | Status: DC
Start: ? — End: 2017-03-08

## 2017-03-08 MED ORDER — SODIUM CHLORIDE 0.9% FLUSH: 9.0000 mL | INTRAVENOUS | Status: DC | PRN

## 2017-03-08 MED ORDER — HYDROMORPHONE 1 MG/ML IV SOLN
INTRAVENOUS | Status: DC
  Administered 2017-03-08: 17:00:00 via INTRAVENOUS

## 2017-03-08 MED ORDER — PROMETHAZINE HCL 25 MG/ML IJ SOLN
12.50 | INTRAMUSCULAR | Status: DC
Start: ? — End: 2017-03-08

## 2017-03-08 MED ORDER — POTASSIUM CHLORIDE IN NACL 20-0.9 MEQ/L-% IV SOLN
INTRAVENOUS | Status: DC
Start: ? — End: 2017-03-08

## 2017-03-08 MED ORDER — PROMETHAZINE HCL 25 MG PO TABS: 12.5000 mg | ORAL_TABLET | Freq: Four times a day (QID) | ORAL | Status: DC | PRN

## 2017-03-08 MED ORDER — SODIUM CHLORIDE 0.9 % IV SOLN
25.0000 mg | INTRAVENOUS | Status: DC | PRN
  Administered 2017-03-08 – 2017-03-09 (×2): 25 mg via INTRAVENOUS
  Filled 2017-03-08 (×2): qty 0.5

## 2017-03-08 MED ORDER — DIPHENHYDRAMINE HCL 25 MG PO CAPS: 25.0000 mg | ORAL_CAPSULE | Freq: Four times a day (QID) | ORAL | Status: DC | PRN

## 2017-03-08 MED ORDER — FOLIC ACID 1 MG PO TABS
1.00 | ORAL_TABLET | ORAL | Status: DC
Start: 2017-03-08 — End: 2017-03-08

## 2017-03-08 MED ORDER — GENERIC EXTERNAL MEDICATION
Status: DC
Start: ? — End: 2017-03-08

## 2017-03-08 MED ORDER — OXYCODONE HCL 5 MG PO TABS
5.00 | ORAL_TABLET | ORAL | Status: DC
Start: ? — End: 2017-03-08

## 2017-03-08 MED ORDER — HYDROXYUREA 500 MG PO CAPS
500.00 | ORAL_CAPSULE | ORAL | Status: DC
Start: 2017-03-07 — End: 2017-03-08

## 2017-03-08 MED ORDER — HYDROMORPHONE 1 MG/ML IV SOLN
INTRAVENOUS | Status: DC
  Administered 2017-03-08: 21:00:00 via INTRAVENOUS
  Administered 2017-03-09: 2 mg via INTRAVENOUS
  Administered 2017-03-09: 7.85 mg via INTRAVENOUS
  Filled 2017-03-08: qty 25

## 2017-03-08 MED ORDER — POTASSIUM CHLORIDE ER 10 MEQ PO TBCR
EXTENDED_RELEASE_TABLET | ORAL | Status: DC
Start: 2017-03-07 — End: 2017-03-08

## 2017-03-08 MED ORDER — HEPARIN SODIUM (PORCINE) 5000 UNIT/ML IJ SOLN
INTRAMUSCULAR | Status: DC
Start: 2017-03-07 — End: 2017-03-08

## 2017-03-08 MED ORDER — SODIUM CHLORIDE 0.9 % IV SOLN
25.0000 mg | INTRAVENOUS | Status: DC | PRN
  Filled 2017-03-08: qty 0.5

## 2017-03-08 MED ORDER — NITROGLYCERIN 0.4 MG SL SUBL
.40 | SUBLINGUAL_TABLET | SUBLINGUAL | Status: DC
Start: ? — End: 2017-03-08

## 2017-03-08 MED ORDER — POLYETHYLENE GLYCOL 3350 17 G PO PACK
17.00 | PACK | ORAL | Status: DC
Start: ? — End: 2017-03-08

## 2017-03-08 MED ORDER — METHOCARBAMOL 500 MG PO TABS
500.00 | ORAL_TABLET | ORAL | Status: DC
Start: 2017-03-07 — End: 2017-03-08

## 2017-03-08 MED ORDER — SODIUM CHLORIDE 0.9 % IV SOLN
25.0000 mg | INTRAVENOUS | Status: DC | PRN
  Administered 2017-03-08: 25 mg via INTRAVENOUS
  Filled 2017-03-08 (×2): qty 0.5

## 2017-03-08 NOTE — Progress Notes (Signed)
Pharmacy Antibiotic Note  Brittany Foster is a 32 y.o. female admitted on 03/07/2017 with sepsis and possible PICC line infection.  Pharmacy has been consulted for Vancomycin, Zosyn dosing.  SCr decreased 0.8 > 0.4 WBC remain WNL Tm 99.6, remains afebrile Blood cultures have been canceled "RN Brittany Foster aware".  Current RN Brittany Foster did not get any messages, but may have been d/t patient refusal.    Plan:  Zosyn 3.375g IV Q8H infused over 4hrs.  Increase to Vancomycin 1250 mg IV q12h.  Daily SCr  Measure Vanc peak/trough at steady state.  Goal AUC 400-500.  Follow up renal fxn, culture results, and clinical course.   Height: 5' (152.4 cm) Weight: 125 lb 14.1 oz (57.1 kg) IBW/kg (Calculated) : 45.5  Temp (24hrs), Avg:98.5 F (36.9 C), Min:97.8 F (36.6 C), Max:99.6 F (37.6 C)  Recent Labs  Lab 03/07/17 2037 03/07/17 2322  WBC  --  9.5  CREATININE 0.40*  --     Estimated Creatinine Clearance: 79.8 mL/min (A) (by C-G formula based on SCr of 0.4 mg/dL (L)).    Allergies  Allergen Reactions  . Ultram [Tramadol] Other (See Comments)    Reaction:  Seizures   . Zofran [Ondansetron Hcl] Nausea And Vomiting  . Buprenorphine Hcl Hives and Other (See Comments)    Reaction:  Shaking   . Fentanyl Hives  . Morphine And Related Hives and Other (See Comments)    Reaction:  Shaking   . Tape Rash    Antimicrobials this admission: 1/6 Vancomycin >>  1/6 Zosyn >>    Dose adjustments this admission:  Microbiology results: 1/7 BCx: canceled (?) 1/6 MRSA PCR: negative  Thank you for allowing pharmacy to be a part of this patient's care.  Brittany Foster PharmD, BCPS Pager [phone] 03/08/2017 1:16 PM

## 2017-03-08 NOTE — Progress Notes (Signed)
Pharmacy Antibiotic Note  Brittany Foster is a 32 y.o. female admitted on 03/07/2017 with sepsis.  Pharmacy has been consulted for Vancomycin and Zosyn dosing.  Plan: Zosyn 3.375g IV q8h (4 hour infusion).   Vancomycin 1gm iv q24hr Goal AUC = 400 - 500 for all indications, except meningitis (goal AUC > 500 and Cmin 15-20 mcg/mL)   Height: 5' (152.4 cm) Weight: 125 lb 14.1 oz (57.1 kg) IBW/kg (Calculated) : 45.5  Temp (24hrs), Avg:99.4 F (37.4 C), Min:99.1 F (37.3 C), Max:99.6 F (37.6 C)  Recent Labs  Lab 03/07/17 2037 03/07/17 2322  WBC  --  9.5  CREATININE 0.40*  --     Estimated Creatinine Clearance: 79.8 mL/min (A) (by C-G formula based on SCr of 0.4 mg/dL (L)).    Allergies  Allergen Reactions  . Ultram [Tramadol] Other (See Comments)    Reaction:  Seizures   . Zofran [Ondansetron Hcl] Nausea And Vomiting  . Buprenorphine Hcl Hives and Other (See Comments)    Reaction:  Shaking   . Fentanyl Hives  . Morphine And Related Hives and Other (See Comments)    Reaction:  Shaking   . Tape Rash    Antimicrobials this admission: Vancomycin 03/07/2017 >> Zosyn 03/07/2017 >>   Dose adjustments this admission: -  Microbiology results: pending  Thank you for allowing pharmacy to be a part of this patient's care.  Brittany Foster, Brittany Foster 03/08/2017 1:30 AM

## 2017-03-08 NOTE — Progress Notes (Signed)
Telemetry removed/discontinued per order. SRP, RN

## 2017-03-08 NOTE — Progress Notes (Signed)
Pt awake and talking PCO2 30-33 resp 12.

## 2017-03-08 NOTE — Progress Notes (Addendum)
Pt request IV benadryl states nauseated and have not eaten in 2 days. SRP, RN

## 2017-03-08 NOTE — Progress Notes (Signed)
SICKLE CELL SERVICE PROGRESS NOTE  Brittany Foster ZOX:[id number] DOB: 14-Jul-1985 DOA: 03/07/2017 PCP: Bing Neighbors, FNP  Assessment/Plan: Principal Problem:   Sepsis [facility]) Active Problems:   Hb-SS disease without crisis (HCC)   Chronic pain syndrome   Opiate dependence (HCC)  1. Febrile illness: Suspected PICC line infection. Reports from outside hospital of purulent discharge from PICC line site. However no current purulent discharge. Will consult IR for PICC line removal. Continue antibiotics and continue to follow up on cultures. She is receiving Toradol which could be masking her fevers.  2. Hb SS with pain: Unfortunately Brittany Foster does not have anyone to prescribe her opiate analgesics on a consistent basis. Thus she continues to present to [facility] for pain medications. I am not sure that she is in crisis as much as she is without medications. I will resume Oxycodone 10 mg every 4 hours and continue PCA at current dose. I do not feel that she is in crisis and will hold Toradol.  3. Chronic Pain Syndrome: Schedule Oxycodone.  4. Hypokalemia: Pt has mild hypokalemia. She is receiving oral replacement re-check potassium levels tomorrow.  5. Anemia of Chronic Disease: Hb stable continue Hydrea.      Code Status: Full Code Family Communication: N/A Disposition Plan: Not yet ready for discharge  Tremel Setters A.  Pager [phone]. If 7PM-7AM, please contact night-coverage.  03/08/2017, 8:17 AM  LOS: 1 day   Interim History: Pt with Hb SS seen at outside hospital for febrile illness. A review of he records from that outside hospital revealed a Tmax of 103.2 and expression of purulent material from the site of the indwelling PICC line. Empiric antibiotic therapy with Vancomycin and Cefepime were ordered and a follow up on cultures at [facility] shows no growth both from peripheral and central line cultures. Urine culture is also resulted as negative. Since admission to [facility]  she was started on Vancomycin on Zosyn. She has had no further fevers since admission to [facility]. She had 1 set of cultures drawn but per nurse has refused to have the 2nd set drawn.   Pt was upset that her transfer her was delayed and wanted to know why. She has developed a pattern of going to an outside hospital and then requesting transfer to [facility]. I explained to patient that there could be many factors affecting the length of time for transfer and there may be sometimes that the hospital would not be able to accept her dependent on availability of beds. She became angry and  reports that she prefers to have her care provided here but has had a trespassing charge in the past so was unsure if she could even obtain care in the ED. I assured patient that she would always be able to receive care in the ED regardless of any previous events.    Consultants:  None  Procedures:  None  Antibiotics:  Vancomycin 1/5 >>  Cefepime 1/5 >>1/6  Zosyn 1/6 >>    Objective: Vitals:   03/08/17 0219 03/08/17 0405 03/08/17 0541 03/08/17 0648  BP: (!) 104/57  (!) 94/57   Pulse: 80  83   Resp: 16 13 13 14   Temp: 97.9 F (36.6 C)  97.8 F (36.6 C)   TempSrc: Oral  Oral   SpO2: 95% 95% 97%   Weight:      Height:       Weight change:  No intake or output data in the 24 hours ending 03/08/17 0817  Physical Exam General: Alert, awake, oriented x3, in no acute distress.  HEENT: [place]/AT PEERL, EOMI, anicteric Neck: Trachea midline,  no masses, no thyromegal,y no JVD, no carotid bruit OROPHARYNX:  Moist, No exudate/ erythema/lesions.  Heart: Regular rate and rhythm, without murmurs, rubs, gallops, PMI non-displaced, no heaves or thrills on palpation.  Lungs: Clear to auscultation, no wheezing or rhonchi noted. No increased vocal fremitus resonant to percussion  Abdomen: Soft, nontender, nondistended, positive bowel sounds, no masses no hepatosplenomegaly noted. Neuro: No focal neurological deficits  noted cranial nerves II through XII grossly intact.  Strength at baseline in bilateral upper and lower extremities. Musculoskeletal: No warmth swelling or erythema around joints, no spinal tenderness noted. Psychiatric: Patient alert and oriented x3, good insight and cognition, good recent to remote recall. Has a very angry affect today.     Data Reviewed: Basic Metabolic Panel: Recent Labs  Lab 03/07/17 2037  CREATININE 0.40*   Liver Function Tests: No results for input(s): AST, ALT, ALKPHOS, BILITOT, PROT, ALBUMIN in the last 168 hours. No results for input(s): LIPASE, AMYLASE in the last 168 hours. No results for input(s): AMMONIA in the last 168 hours. CBC: Recent Labs  Lab 03/07/17 2322  WBC 9.5  NEUTROABS 6.0  HGB 7.9*  HCT 22.4*  MCV 102.8*  PLT 247   Cardiac Enzymes: No results for input(s): CKTOTAL, CKMB, CKMBINDEX, TROPONINI in the last 168 hours. BNP (last 3 results) No results for input(s): BNP in the last 8760 hours.  ProBNP (last 3 results) No results for input(s): PROBNP in the last 8760 hours.  CBG: No results for input(s): GLUCAP in the last 168 hours.  Recent Results (from the past 240 hour(s))  MRSA PCR Screening     Status: None   Collection Time: 03/07/17  5:56 PM  Result Value Ref Range Status   MRSA by PCR NEGATIVE NEGATIVE Final    Comment:        The GeneXpert MRSA Assay (FDA approved for NASAL specimens only), is one component of a comprehensive MRSA colonization surveillance program. It is not intended to diagnose MRSA infection nor to guide or monitor treatment for MRSA infections.      Studies: Dg Chest 2 View  Result Date: 02/17/2017 CLINICAL DATA:  Chest pain.  Sickle cell crisis. EXAM: CHEST  2 VIEW COMPARISON:  11/14/2016 FINDINGS: The heart size and pulmonary vascularity are normal. Minimal scarring at the left lung base. Lungs are otherwise clear. Right-sided catheter tip is in the superior vena cava, unchanged. No  effusions.  No significant bone abnormality. IMPRESSION: No acute abnormality. Electronically Signed   By: Francene Boyers M.D.   On: 02/17/2017 13:40    Scheduled Meds: . ARIPiprazole  10 mg Oral Daily  . DULoxetine  60 mg Oral Daily  . enoxaparin (LOVENOX) injection  40 mg Subcutaneous Q24H  . folic acid  1 mg Oral Daily  . HYDROmorphone   Intravenous Q4H  . hydroxyurea  1,500 mg Oral Daily  . ketorolac  30 mg Intravenous Q6H  . methocarbamol  500 mg Oral Q6H  . oxyCODONE  10 mg Oral Q12H  . potassium chloride SA  40 mEq Oral Daily  . senna-docusate  1 tablet Oral BID  . topiramate  50 mg Oral BID   Continuous Infusions: . piperacillin-tazobactam (ZOSYN)  IV 3.375 g (03/08/17 0634)  . vancomycin Stopped (03/08/17 0040)    Principal Problem:   Sepsis (HCC) Active Problems:   Hb-SS disease without crisis (HCC)  Chronic pain syndrome   Opiate dependence (HCC)    In excess of 25 minutes spent during this visit. Greater than 50% involved face to face contact with the patient for assessment, counseling and coordination of care.

## 2017-03-08 NOTE — Progress Notes (Signed)
Pt am blood cultures shows incomplete, because pt REFUSED, therefore I spoke with patient attempting to enc to complete the lab work and explained the importance of the lab work, pt continues to REFUSE to have blood cultures drawn. Will update MD when during rounding. SRP,RN

## 2017-03-08 NOTE — Progress Notes (Signed)
Pt states relief after Benadryl administered, also informed pt of Benadryl order, she understands and states the reason she asked for IV is that she became nasueated after taking her oral pills currently PCO2 30 with respiration 16, enc pt to use IS and take deep breathes. Will cont to monitor. SRP, RN

## 2017-03-09 ENCOUNTER — Encounter (HOSPITAL_COMMUNITY): Payer: Self-pay | Admitting: Interventional Radiology

## 2017-03-09 ENCOUNTER — Inpatient Hospital Stay (HOSPITAL_COMMUNITY): Payer: Medicaid Other

## 2017-03-09 DIAGNOSIS — T80219D Unspecified infection due to central venous catheter, subsequent encounter: Secondary | ICD-10-CM

## 2017-03-09 DIAGNOSIS — F54 Psychological and behavioral factors associated with disorders or diseases classified elsewhere: Secondary | ICD-10-CM

## 2017-03-09 DIAGNOSIS — F1120 Opioid dependence, uncomplicated: Secondary | ICD-10-CM

## 2017-03-09 HISTORY — PX: IR REMOVAL TUN CV CATH W/O FL: IMG2289

## 2017-03-09 LAB — BASIC METABOLIC PANEL
Anion gap: 6 (ref 5–15)
BUN: 8 mg/dL (ref 6–20)
CALCIUM: 8.2 mg/dL — AB (ref 8.9–10.3)
CO2: 17 mmol/L — ABNORMAL LOW (ref 22–32)
CREATININE: 0.62 mg/dL (ref 0.44–1.00)
Chloride: 114 mmol/L — ABNORMAL HIGH (ref 101–111)
GFR calc non Af Amer: >60 mL/min (ref >60)
Glucose, Bld: 110 mg/dL — ABNORMAL HIGH (ref 65–99)
Potassium: 3.5 mmol/L (ref 3.5–5.1)
SODIUM: 137 mmol/L (ref 135–145)

## 2017-03-09 LAB — MAGNESIUM: MAGNESIUM: 2 mg/dL (ref 1.7–2.4)

## 2017-03-09 MED ORDER — LIDOCAINE HCL 1 % IJ SOLN
INTRAMUSCULAR | Status: AC
  Filled 2017-03-09: qty 20

## 2017-03-09 MED ORDER — HYDROMORPHONE 1 MG/ML IV SOLN
INTRAVENOUS | Status: DC
  Administered 2017-03-09: 8 mg via INTRAVENOUS
  Administered 2017-03-09: 6.5 mg via INTRAVENOUS
  Administered 2017-03-09: 1 mL via INTRAVENOUS
  Administered 2017-03-09: 1 mg via INTRAVENOUS
  Administered 2017-03-10: 5 mg via INTRAVENOUS
  Administered 2017-03-10: 3 mg via INTRAVENOUS
  Administered 2017-03-10: 6.5 mg via INTRAVENOUS
  Administered 2017-03-10: 3.5 mg via INTRAVENOUS
  Administered 2017-03-10: 2.5 mg via INTRAVENOUS
  Administered 2017-03-10: 6.5 mg via INTRAVENOUS
  Administered 2017-03-10: 9 mL via INTRAVENOUS
  Administered 2017-03-11: 4.5 mg via INTRAVENOUS
  Administered 2017-03-11: 3.5 mg via INTRAVENOUS
  Filled 2017-03-09 (×2): qty 25

## 2017-03-09 MED ORDER — GENERIC EXTERNAL MEDICATION
Status: DC
Start: ? — End: 2017-03-09

## 2017-03-09 MED ORDER — LIDOCAINE HCL 1 % IJ SOLN
INTRAMUSCULAR | Status: AC | PRN
  Administered 2017-03-09: 5 mL

## 2017-03-09 NOTE — Progress Notes (Addendum)
Xray tech transporter on dept to take pt to IR. SP, RN

## 2017-03-09 NOTE — Progress Notes (Signed)
SICKLE CELL SERVICE PROGRESS NOTE  Kate Larock ZOX:[id number] DOB: 1985/09/29 DOA: 03/07/2017 PCP: Bing Neighbors, FNP  Assessment/Plan: Principal Problem:   Sepsis [facility]) Active Problems:   Hb-SS disease without crisis (HCC)   Chronic pain syndrome   Opiate dependence (HCC)  1. Febrile illness: Suspected PICC line infection based on fevers and reports from Physician at OSH of purulent and documented fever. I discussed this with ID as the site currently shows no signs of infection and they advised that PICC should still be removed. Will continue Zosyn and discontinue Vancomycin as gram stain at OSH shows no GPC. If cultures still negative at 48 hours will transition to a comparable oral antibiotic for discharge. 2. Hb SS with pain: Pt refusing oral analgesics and demanding IV Dilaudid. I scheduled her oral Oxycodone yesterday and will continue to wean IV Dilaudid. If she has no fevers without the Toradol, I will resume Toradol this evening.  3. Hypokalemia:Improved with oral replacement;  4. Anemia of Chronic Disease: Hb stable continue Hydrea.  5. Behavior: Pt cursing and verbally abusive. AD from the unit present and informed patient of zero-tolerance of abusive speech and behavior.  6. Capacity to refuse care: it is my medical opinion that patient has mental capacity to accept or refuse care. I will ask Psychiatry to assess patient for capacity and also to evaluate need for adjustment of medications for Depression.     Code Status: Full Code Family Communication: N/A Disposition Plan: Not yet ready for discharge  Tawana Pasch A.  Pager [phone]. If 7PM-7AM, please contact night-coverage.  03/09/2017, 11:23 AM  LOS: 2 days   Interim History: Pt reports pain in back and legs but refuses to note the intensity and states that  " I got pain...that's all..". It is noted that she refused both doses of her oral analgesics overnight but took her Oxycodone this morning. IR was consulted  to remove the PICC line and she initially refused with a concern for ongoing access. I explained tot he patient that IR was not willing to place another PICC at this time until there was assurance that there was no bacteremia and that cultures were still too young to make that assessment. She then agreed to have the PICC line removed. Note that patient has been refusing the have the 2nd culture drawn. Patietn requesting a copy of her labs.   Pt became very boisterous because I enquired about the medications that were refused last night. She then  refused examination unless I would increase the IV Dilaudid. Thus a physical examination was not performed. However she did agree to have the PICC line removed.   Consultants:  None  Procedures:  None  Antibiotics:  Vancomycin 1/5 >> 1/8  Cefepime 1/5 >>1/6  Zosyn 1/6 >>    Objective: Vitals:   03/09/17 0414 03/09/17 0657 03/09/17 0938 03/09/17 0958  BP:  (!) 95/53 107/60   Pulse:  86 82   Resp: 20 20 17 18   Temp:  98.5 F (36.9 C) 99.2 F (37.3 C)   TempSrc:  Oral Oral   SpO2: 95% 93% 94% 95%  Weight:      Height:       Weight change:   Intake/Output Summary (Last 24 hours) at 03/09/2017 1123 Last data filed at 03/09/2017 0212 Gross per 24 hour  Intake 440 ml  Output 900 ml  Net -460 ml      Physical Exam General: Alert, awake, oriented x3, in no apparent distress.  HEENT:  [place]/AT, EOMI, anicteric.  Heart: Unable to assess. Pt refused examination. Lungs: Unable to assess. Pt refused examination.  Abdomen:Unable to assess. Pt refused examination. Neuro: No focal neurological deficits observed. Unable to examine as patient refused examination. However she is able to sit up and transition from sit to stand wihtout difficulty.   Musculoskeletal: Unable to assess. Pt refused examination.  Psychiatric: Patient alert and oriented x3. She has a very angry affect and is uncooperative and verbally offensive in her speech.    Data  Reviewed: Basic Metabolic Panel: Recent Labs  Lab 03/07/17 2037 03/09/17 0533  NA  --  137  K  --  3.5  CL  --  114*  CO2  --  17*  GLUCOSE  --  110*  BUN  --  8  CREATININE 0.40* 0.62  CALCIUM  --  8.2*  MG  --  2.0   Liver Function Tests: No results for input(s): AST, ALT, ALKPHOS, BILITOT, PROT, ALBUMIN in the last 168 hours. No results for input(s): LIPASE, AMYLASE in the last 168 hours. No results for input(s): AMMONIA in the last 168 hours. CBC: Recent Labs  Lab 03/07/17 2322  WBC 9.5  NEUTROABS 6.0  HGB 7.9*  HCT 22.4*  MCV 102.8*  PLT 247   Cardiac Enzymes: No results for input(s): CKTOTAL, CKMB, CKMBINDEX, TROPONINI in the last 168 hours. BNP (last 3 results) No results for input(s): BNP in the last 8760 hours.  ProBNP (last 3 results) No results for input(s): PROBNP in the last 8760 hours.  CBG: No results for input(s): GLUCAP in the last 168 hours.  Recent Results (from the past 240 hour(s))  MRSA PCR Screening     Status: None   Collection Time: 03/07/17  5:56 PM  Result Value Ref Range Status   MRSA by PCR NEGATIVE NEGATIVE Final    Comment:        The GeneXpert MRSA Assay (FDA approved for NASAL specimens only), is one component of a comprehensive MRSA colonization surveillance program. It is not intended to diagnose MRSA infection nor to guide or monitor treatment for MRSA infections.      Studies: Dg Chest 2 View  Result Date: 02/17/2017 CLINICAL DATA:  Chest pain.  Sickle cell crisis. EXAM: CHEST  2 VIEW COMPARISON:  11/14/2016 FINDINGS: The heart size and pulmonary vascularity are normal. Minimal scarring at the left lung base. Lungs are otherwise clear. Right-sided catheter tip is in the superior vena cava, unchanged. No effusions.  No significant bone abnormality. IMPRESSION: No acute abnormality. Electronically Signed   By: Francene BoyersJames  Maxwell M.D.   On: 02/17/2017 13:40    Scheduled Meds: . ARIPiprazole  10 mg Oral Daily  .  DULoxetine  60 mg Oral Daily  . enoxaparin (LOVENOX) injection  40 mg Subcutaneous Q24H  . folic acid  1 mg Oral Daily  . HYDROmorphone   Intravenous Q4H  . hydroxyurea  1,500 mg Oral Daily  . methocarbamol  500 mg Oral Q6H  . oxyCODONE  10 mg Oral Q4H  . oxyCODONE  10 mg Oral Q12H  . potassium chloride SA  40 mEq Oral Daily  . senna-docusate  1 tablet Oral BID  . topiramate  50 mg Oral BID   Continuous Infusions: . piperacillin-tazobactam (ZOSYN)  IV Stopped (03/09/17 0959)  . vancomycin Stopped (03/09/17 0309)      In excess of 35 minutes spent during this visit. Greater than 50% involved face to face contact with the patient for  assessment, counseling and coordination of care.

## 2017-03-09 NOTE — Progress Notes (Signed)
Received call from IR stating pt refused to have PICC line removed without discussing with MD. WIll notify MD of update. SRP, RN

## 2017-03-09 NOTE — Progress Notes (Signed)
Updated IR that patient agreed to have PICC line remove PCMD at bedside discussing with patient. SRP, RN

## 2017-03-09 NOTE — Progress Notes (Signed)
Pt nauseated and itching..... scratching arms and stomach, Benedryl given IV, and ginger ale given with am meds, states " hope I can keep my medicine down, unable to eat since I have been here."  Will cont to monitor. SRP,RN

## 2017-03-09 NOTE — Consult Note (Signed)
Patient declined to participate in interview and would not speak to this notewriter. She only nodded her head that she was unwilling to participate in interview. She was last seen by the psychiatry consult service in 01/2016. Abilify was started for mood augmentation. If concern for depression/irritability could consider increasing Cymbalta from 60 mg to 90 mg daily given history of MDD and pain.  She was informed to request to speak to psychiatry if she changes her mind.   Brittany BeetsJacqueline Zaelynn Fuchs, DO

## 2017-03-09 NOTE — Progress Notes (Signed)
Pt took all meds without issues will continue to monitor nausea. PCO2 30 w/ resp 19 VS noted. PICC line site without reddness and tenderness this am. Will cont to monitor. SRP, RN

## 2017-03-09 NOTE — Progress Notes (Signed)
Patient refused several medications on night shift last night, 03/08/17, 7 pm - 7 am shift.   She refused Lovenox, Robaxin, Roxicodone 2200 and 0600 dose and Senna. Her BP ran systolic mid to high 90's and diastolic in the mid 50's.  Thanks!

## 2017-03-09 NOTE — Progress Notes (Signed)
MD on unit and inquired about pt refusing to have PICC line removal, informed MD that pt request to discuss PICC line once again to have question addressed by MD. MD already informed upon arrival on the unit,  timing did not allow Clinical research associatewriter the opportunity to update MD prior to arrival. SRP, RN

## 2017-03-10 DIAGNOSIS — R42 Dizziness and giddiness: Secondary | ICD-10-CM

## 2017-03-10 DIAGNOSIS — F1124 Opioid dependence with opioid-induced mood disorder: Secondary | ICD-10-CM

## 2017-03-10 LAB — CREATININE, SERUM
Creatinine, Ser: 0.53 mg/dL (ref 0.44–1.00)
GFR calc non Af Amer: >60 mL/min (ref >60)

## 2017-03-10 LAB — CBC WITH DIFFERENTIAL/PLATELET
Basophils Absolute: 0.1 10*3/uL (ref 0.0–0.1)
Basophils Relative: 1 %
EOS ABS: 0.1 10*3/uL (ref 0.0–0.7)
Eosinophils Relative: 1 %
HCT: 18.7 % — ABNORMAL LOW (ref 36.0–46.0)
HEMOGLOBIN: 6.7 g/dL — AB (ref 12.0–15.0)
LYMPHS PCT: 36 %
Lymphs Abs: 3 10*3/uL (ref 0.7–4.0)
MCH: 35.8 pg — ABNORMAL HIGH (ref 26.0–34.0)
MCHC: 35.8 g/dL (ref 30.0–36.0)
MCV: 100 fL (ref 78.0–100.0)
MONOS PCT: 8 %
Monocytes Absolute: 0.7 10*3/uL (ref 0.1–1.0)
Neutro Abs: 4.5 10*3/uL (ref 1.7–7.7)
Neutrophils Relative %: 54 %
Platelets: 138 10*3/uL — ABNORMAL LOW (ref 150–400)
RBC: 1.87 MIL/uL — AB (ref 3.87–5.11)
RDW: 21.2 % — ABNORMAL HIGH (ref 11.5–15.5)
WBC: 8.4 10*3/uL (ref 4.0–10.5)
nRBC: 10 /100 WBC — ABNORMAL HIGH

## 2017-03-10 LAB — RETICULOCYTES
RBC.: 1.87 MIL/uL — AB (ref 3.87–5.11)
RETIC CT PCT: 11.9 % — AB (ref 0.4–3.1)
Retic Count, Absolute: 222.5 10*3/uL — ABNORMAL HIGH (ref 19.0–186.0)

## 2017-03-10 MED ORDER — OXYCODONE-ACETAMINOPHEN 5-325 MG PO TABS
1.0000 | ORAL_TABLET | ORAL | Status: DC
  Administered 2017-03-10 – 2017-03-11 (×5): 1 via ORAL
  Filled 2017-03-10 (×5): qty 1

## 2017-03-10 MED ORDER — OXYCODONE HCL 5 MG PO TABS
5.0000 mg | ORAL_TABLET | ORAL | Status: DC
  Administered 2017-03-10 – 2017-03-11 (×5): 5 mg via ORAL
  Filled 2017-03-10 (×5): qty 1

## 2017-03-10 MED ORDER — PROMETHAZINE HCL 25 MG RE SUPP
12.5000 mg | Freq: Four times a day (QID) | RECTAL | Status: DC | PRN
  Administered 2017-03-10 – 2017-03-11 (×2): 12.5 mg via RECTAL
  Filled 2017-03-10 (×2): qty 1

## 2017-03-10 NOTE — Progress Notes (Signed)
Patient O2 sat 96% on RA, HR 88 at rest, ambulated less than 50 feet and c/o of feeling dizzy, o2 sat 86% on RA, HR 106, returned to room and sat in chair, O2 sat 96% on RA, HR 96.

## 2017-03-10 NOTE — Progress Notes (Signed)
Patient is refusing meds other than Dilaudid, oxycodone, and Zosyn. Is requesting IV Benadryl and refuses it orally.

## 2017-03-10 NOTE — Progress Notes (Signed)
CRITICAL VALUE ALERT  Critical Value:  Hgb 6.7  Date & Time Notied:  03/10/17 0915  Provider Notified: Dr Judie PetitM. Ashley RoyaltyMatthews  Orders Received/Actions taken: returned call at [phone] acknowledging page, no new orders at this time

## 2017-03-10 NOTE — Progress Notes (Signed)
SICKLE CELL SERVICE PROGRESS NOTE  Brittany Foster ZOX:[id number] DOB: 22-Apr-1985 DOA: 03/07/2017 PCP: Bing Neighbors, FNP  Assessment/Plan: Principal Problem:   Sepsis [facility]) Active Problems:   Hb-SS disease without crisis (HCC)   Chronic pain syndrome   Opiate dependence (HCC)  1. Infected Catheter: Blood cultures at [facility] have been negative for 72 hours. Patient has been afebrile despite discontinuation of antibiotics. Will discontinue all antibiotics and follow up on culture of catheter tip.  2. Hb SS with Crisis: Pt has not maximized PCA use will continue PCA and change to Percocet 10-325 mg. Encourage ICS use.  3. Dizziness: Orthostatics negative. Pt has no focal neurological deficits. Encourage walking and sitting up.  4. Anemia of Chronic Disease: Hb at baseline. Continue Hydroxiurea.  5. Medication Management: Pt had instructed [facility] to list me as her "lock in Physician" with their Opiate Prescribing Oversight. I explained to patient that I could not act as her "lock in" Physician as that Physician needed to have the ability to monitor her opiate use and effectiveness on an ongoing basis. I spoke with [facility] and had my name removed as the "Lock in Provider/Phusician". As advised by [facility], I informed patient that she should call  [facility] to obtain a Provider who would be able to prescribe her Opiates and function as her "Lock in Provider".  6. Opioid Addiction: Pt certainly has tolerance to opiates however her extreme illicit behaviors surrounding the acquisition of opiates qualifies as addiction. She is resistant to any attempts for rehab and there have been multiple attempts and plans for her to go to rehab which she has defaulted on. I have asked Psychiatrist to see this patient to assess for any further underlying psychiatric conditions that may be contributing and also to assess the need for medication adjustment and she refused to speak with the Psychiatrist. I am  very concerned that this patient is at high risk for illicit drug use in the absence of being able to obtain prescription opiates. Will consult SW.     Code Status: Full Code Family Communication: N/A Disposition Plan: Anticipate discharge tomorrow.   Brittany Mceachron A.  Pager [phone]. If 7PM-7AM, please contact night-coverage.  03/10/2017, 12:37 PM  LOS: 3 days   Interim History: Pt reports that current dose of medication not effective for pain control. She has used 25.6 mg of Dilaudid on the PCA with 52/51:demands/deliveries in the last 24 hours which is the equivalent of 510 mg of Morphine. She states that she is still having pain all over. Pt has refused to sit up in chair or ambulate for the last 5 days. She c/o dizziness when she got up to ambulate today.   Brittany Foster is very specific of the dose on the PCA that she wants. She believes that my role in her care is for the Physician to prescribe whatever dose and the route of opiate that she deems appropriate. When [place] does not receive the dose of Dilaudid that she desires, she becomes verbally offensive and abusive as she has throughout this hospitalization. I have discussed with Joline that her behaviors surrounding the use of narcotics qualifies as addictive and that the solution to her problem with tolerance is treatment of her addiction. However she does not feel that she needs this form of therapy and has refused despite multiple attempts and offerings for rehab.   Dori reported today that she has named me as her "lock in Provider" with the [facility].  I advised [name] that I could not serve as her  "Lock in Provider" without the ability to monitor her opiate use or evaluate the effectiveness of the Opiates on a chronic basis.  Consultants:  Psychiatry  Procedures:  None  Antibiotics:  Vancomycin 1/5>>1/8  Cefepime 15>>1/6  Zosyn 1/6>>1/9    Objective: Vitals:   03/10/17 1115 03/10/17 1117 03/10/17  1118 03/10/17 1120  BP: 97/60 (!) 92/55 (!) 92/52 97/60  Pulse: 85 97 (!) 101 (!) 103  Resp:      Temp:      TempSrc:      SpO2:      Weight:      Height:       Weight change:   Intake/Output Summary (Last 24 hours) at 03/10/2017 1237 Last data filed at 03/10/2017 [id number] Gross per 24 hour  Intake 630 ml  Output 2000 ml  Net -1370 ml     Physical Exam General: Alert, awake, oriented x3, in no acute distress.  HEENT: Brittany Foster/AT PEERL, EOMI, anicteric Neck: Trachea midline,  no masses, no thyromegal,y no JVD, no carotid bruit OROPHARYNX:  Moist, No exudate/ erythema/lesions.  Heart: Regular rate and rhythm, without murmurs, rubs, gallops, PMI non-displaced, no heaves or thrills on palpation.  Lungs: Clear to auscultation, no wheezing or rhonchi noted. No increased vocal fremitus resonant to percussion  Abdomen: Soft, nontender, nondistended, positive bowel sounds, no masses no hepatosplenomegaly noted.  Neuro: No focal neurological deficits noted cranial nerves II through XII grossly intact. Strength at baseline in bilateral upper and lower extremities. Musculoskeletal: No warmth, swelling or erythema around joints, no spinal tenderness noted. Psychiatric: Patient alert and oriented x3, good recent to remote recall.      Data Reviewed: Basic Metabolic Panel: Recent Labs  Lab 03/07/17 2037 03/09/17 0533 03/10/17 0421  NA  --  137  --   K  --  3.5  --   CL  --  114*  --   CO2  --  17*  --   GLUCOSE  --  110*  --   BUN  --  8  --   CREATININE 0.40* 0.62 0.53  CALCIUM  --  8.2*  --   MG  --  2.0  --    Liver Function Tests: No results for input(s): AST, ALT, ALKPHOS, BILITOT, PROT, ALBUMIN in the last 168 hours. No results for input(s): LIPASE, AMYLASE in the last 168 hours. No results for input(s): AMMONIA in the last 168 hours. CBC: Recent Labs  Lab 03/07/17 2322 03/10/17 0835  WBC 9.5 8.4  NEUTROABS 6.0 4.5  HGB 7.9* 6.7*  HCT 22.4* 18.7*  MCV 102.8* 100.0  PLT 247  138*   Cardiac Enzymes: No results for input(s): CKTOTAL, CKMB, CKMBINDEX, TROPONINI in the last 168 hours. BNP (last 3 results) No results for input(s): BNP in the last 8760 hours.  ProBNP (last 3 results) No results for input(s): PROBNP in the last 8760 hours.  CBG: No results for input(s): GLUCAP in the last 168 hours.  Recent Results (from the past 240 hour(s))  MRSA PCR Screening     Status: None   Collection Time: 03/07/17  5:56 PM  Result Value Ref Range Status   MRSA by PCR NEGATIVE NEGATIVE Final    Comment:        The GeneXpert MRSA Assay (FDA approved for NASAL specimens only), is one component of a comprehensive MRSA colonization surveillance program. It is not intended to diagnose MRSA infection nor  to guide or monitor treatment for MRSA infections.      Studies: Dg Chest 2 View  Result Date: 02/17/2017 CLINICAL DATA:  Chest pain.  Sickle cell crisis. EXAM: CHEST  2 VIEW COMPARISON:  11/14/2016 FINDINGS: The heart size and pulmonary vascularity are normal. Minimal scarring at the left lung base. Lungs are otherwise clear. Right-sided catheter tip is in the superior vena cava, unchanged. No effusions.  No significant bone abnormality. IMPRESSION: No acute abnormality. Electronically Signed   By: Francene Boyers M.D.   On: 02/17/2017 13:40   Ir Removal Tun Cv Cath W/o Fl  Result Date: 03/09/2017 INDICATION: Infected right jugular tunneled PICC EXAM: REMOVAL TUNNELED CENTRAL VENOUS CATHETER MEDICATIONS: None. ANESTHESIA/SEDATION: None FLUOROSCOPY TIME:  None COMPLICATIONS: None immediate. PROCEDURE: Informed written consent was obtained from the patient after a thorough discussion of the procedural risks, benefits and alternatives. All questions were addressed. Maximal Sterile Barrier Technique was utilized including caps, mask, sterile gowns, sterile gloves, sterile drape, hand hygiene and skin antiseptic. A timeout was performed prior to the initiation of the  procedure. The right chest was prepped with ChloraPrep in a sterile fashion, and a sterile drape was applied covering the operative field. A sterile gown and sterile gloves were used for the procedure. 1% lidocaine was utilized for local anesthesia. Utilizing blunt dissection, the cuff of the catheter was freed from the underlying subcutaneous tissue. The catheter was removed in its entirety. Hemostasis was achieved with direct pressure. IMPRESSION: Successful tunneled PICC removal. Electronically Signed   By: Jolaine Click M.D.   On: 03/09/2017 12:26    Scheduled Meds: . ARIPiprazole  10 mg Oral Daily  . DULoxetine  60 mg Oral Daily  . enoxaparin (LOVENOX) injection  40 mg Subcutaneous Q24H  . folic acid  1 mg Oral Daily  . HYDROmorphone   Intravenous Q4H  . hydroxyurea  1,500 mg Oral Daily  . methocarbamol  500 mg Oral Q6H  . oxyCODONE  10 mg Oral Q4H  . oxyCODONE  10 mg Oral Q12H  . potassium chloride SA  40 mEq Oral Daily  . senna-docusate  1 tablet Oral BID  . topiramate  50 mg Oral BID   Continuous Infusions:  Principal Problem:   Sepsis (HCC) Active Problems:   Hb-SS disease without crisis (HCC)   Chronic pain syndrome   Opiate dependence (HCC)    In excess of 60 minutes spent during this visit. Greater than 50% involved face to face contact with the patient for assessment, counseling and coordination of care.

## 2017-03-11 DIAGNOSIS — T80219S Unspecified infection due to central venous catheter, sequela: Secondary | ICD-10-CM

## 2017-03-11 MED ORDER — OXYCODONE-ACETAMINOPHEN 10-325 MG PO TABS: 1.0000 | ORAL_TABLET | ORAL | 0 refills | Status: AC | PRN

## 2017-03-11 NOTE — Progress Notes (Signed)
CSW consulted to assess opiate use issues. Pt declined to speak with CSW re: this, denied issues (per chart hx of pain medication-seeking behavior). [place] [facility]. Discussed with CM, AD, suggested bus pass to bus depot in order to take [employer] bus. Pt states, "I am not riding any bus, I'll find a ride." CSW signed off.  Ilean SkillMeghan Shaheim Mahar, MSW, LCSW Clinical Social Work 03/11/2017 [phone]

## 2017-03-11 NOTE — Plan of Care (Signed)
Discharge instructions reviewed with patient, prescription for Percocet given.  Patient stated she had no questions about her discharge paperwork, just needed assistance [place] [facility].  Patient had already spoken with social worker about options regarding this.  When RN came back to room patient had walked off unit with belongings.

## 2017-03-11 NOTE — Discharge Summary (Signed)
Brittany Foster MRN: [id number] DOB/AGE: [birth year] 32 y.o.  Admit date: 03/07/2017 Discharge date: 03/11/2017  Primary Care Physician:  Bing Neighbors, FNP   Discharge Diagnoses:   Patient Active Problem List   Diagnosis Date Noted  . Hypotension 01/26/2016  . Opiate dependence (HCC) 01/23/2016  . MDD (major depressive disorder), recurrent severe, without psychosis (HCC) 01/10/2016  . Vitamin D deficiency 01/08/2015  . Chronic pain syndrome 01/08/2015  . 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 Anemia of chronic disease 02/09/2014  . Neuropathy (HCC) 02/09/2014    DISCHARGE MEDICATION: Allergies as of 03/11/2017      Reactions   Ultram [tramadol] Other (See Comments)   Reaction:  Seizures    Zofran [ondansetron Hcl] Nausea And Vomiting   Buprenorphine Hcl Hives, Other (See Comments)   Reaction:  Shaking    Fentanyl Hives   Morphine And Related Hives, Other (See Comments)   Reaction:  Shaking    Tape Rash      Medication List    STOP taking these medications   oxyCODONE 10 mg 12 hr tablet Commonly known as:  OXYCONTIN     TAKE these medications   ARIPiprazole 10 MG tablet Commonly known as:  ABILIFY Take 10 mg by mouth daily   DULoxetine 60 MG capsule Commonly known as:  CYMBALTA TAKE 1 CAPSULE BY MOUTH DAILY.   folic acid 1 MG tablet Commonly known as:  FOLVITE Take 1 tablet (1 mg total) by mouth daily.   hydroxyurea 500 MG capsule Commonly known as:  HYDREA TAKE 2 CAPSULES BY MOUTH DAILY. MAY TAKE WITH FOOD TO MINIMIZE GI SIDE EFFECTS. What changed:    how much to take  how to take this  when to take this  additional instructions   ibuprofen 200 MG tablet Commonly known as:  ADVIL,MOTRIN Take 400 mg by mouth every 6 (six) hours as needed for fever, headache, mild pain or moderate pain.   lactulose 20 g packet Commonly known as:  CEPHULAC Take 1 packet (20 g total) by mouth 3 (three) times daily as needed. What changed:  reasons to take  this   medroxyPROGESTERone 150 MG/ML injection Commonly known as:  DEPO-PROVERA Inject 150 mg into the muscle every 3 (three) months.   methocarbamol 500 MG tablet Commonly known as:  ROBAXIN Take 1 tablet (500 mg total) by mouth 4 (four) times daily. What changed:    when to take this  reasons to take this   oxyCODONE-acetaminophen 10-325 MG tablet Commonly known as:  PERCOCET Take 1 tablet by mouth every 4 (four) hours as needed for pain.   potassium chloride SA 20 MEQ tablet Commonly known as:  K-DUR,KLOR-CON Take 2 tablets (40 mEq total) by mouth daily.   Topiramate ER 100 MG Cp24 Commonly known as:  TROKENDI XR Take 200 mg by mouth at bedtime. What changed:  how much to take   traZODone 50 MG tablet Commonly known as:  DESYREL Take 0.5-1 tablets (25-50 mg total) by mouth at bedtime as needed for sleep.         Consults:    SIGNIFICANT DIAGNOSTIC STUDIES:  Dg Chest 2 View  Result Date: 02/17/2017 CLINICAL DATA:  Chest pain.  Sickle cell crisis. EXAM: CHEST  2 VIEW COMPARISON:  11/14/2016 FINDINGS: The heart size and pulmonary vascularity are normal. Minimal scarring at the left lung base. Lungs are otherwise clear. Right-sided catheter tip is in the superior vena cava, unchanged. No effusions.  No significant bone abnormality.  IMPRESSION: No acute abnormality. Electronically Signed   By: Francene Boyers M.D.   On: 02/17/2017 13:40   Ir Removal Tun Cv Cath W/o Fl  Result Date: 03/09/2017 INDICATION: Infected right jugular tunneled PICC EXAM: REMOVAL TUNNELED CENTRAL VENOUS CATHETER MEDICATIONS: None. ANESTHESIA/SEDATION: None FLUOROSCOPY TIME:  None COMPLICATIONS: None immediate. PROCEDURE: Informed written consent was obtained from the patient after a thorough discussion of the procedural risks, benefits and alternatives. All questions were addressed. Maximal Sterile Barrier Technique was utilized including caps, mask, sterile gowns, sterile gloves, sterile drape,  hand hygiene and skin antiseptic. A timeout was performed prior to the initiation of the procedure. The right chest was prepped with ChloraPrep in a sterile fashion, and a sterile drape was applied covering the operative field. A sterile gown and sterile gloves were used for the procedure. 1% lidocaine was utilized for local anesthesia. Utilizing blunt dissection, the cuff of the catheter was freed from the underlying subcutaneous tissue. The catheter was removed in its entirety. Hemostasis was achieved with direct pressure. IMPRESSION: Successful tunneled PICC removal. Electronically Signed   By: Jolaine Click M.D.   On: 03/09/2017 12:26      Recent Results (from the past 240 hour(s))  MRSA PCR Screening     Status: None   Collection Time: 03/07/17  5:56 PM  Result Value Ref Range Status   MRSA by PCR NEGATIVE NEGATIVE Final    Comment:        The GeneXpert MRSA Assay (FDA approved for NASAL specimens only), is one component of a comprehensive MRSA colonization surveillance program. It is not intended to diagnose MRSA infection nor to guide or monitor treatment for MRSA infections.     BRIEF ADMITTING H & P: This is an opiate tolerant patient with Hb SS and a tunneled PICC line placed in 2017 who presented to an OSH with fever of 102. According to the reports of the Physician at the OSH, pus was expresses from the site of the PICC line on examination. Blood cultures were drawn from both a peripheral site and also from the PICC and patient was started on Vancomycin and Cefepime. The PICC line needed to be removed and the IV team at that hospital was unable to remove the PICC and so she was transferred here to have access to IR for removal of the PICC.    Hospital Course:  Present on Admission: . (Resolved) Sepsis (HCC) . Hb-SS disease without crisis (HCC) . Chronic pain syndrome . Opiate dependence (HCC)  Pt was transferred to [facility] hospital for treatment of PICC line sepsis and removal  of the PICC. On admission blood cultures were ordered and she was started on Vancomycin and Zosyn. However she allowed only 1 BC to be drawn and refused to have the other BC drawn. Pt had a T max of 103 at the OSH, however she remained afebrile throughout her hospital course at [facility]. IR was consulted and the PICC removed. So far all cultures are negative. Vancomycin was discontinued after cultures were negative for 48 hours and Zosyn discontinued after cultures negative for 72 hours. The tip if the PICC was sent for culture and so far is negative. Pt remained afebrile throughout her hospital course and she is being discharged without any further antibiotics. On arrival she was given a diagnosis of sepsis based on vital signs on arrival to the OSH which included fever, tachycardia and hypotension. However the BP noted at the OSH is her baseline BP. The initial presentation  at the OSH is consistent with Opiate withdrawal.   With regard to her Hb SS, she reported that she was in crisis however it is my clinical opinion that she was exhibiting symptoms of opiate withdrawal which accounted for all of her symptoms including the fever and tachycardia. These symptoms resolved once she was started on opiates. Erring on the side of caution, she was starrted on a Dilaudid PCA, Toradol and IVF. The PCA was weaned and she was transitioned to oral analgesics. She still reports her pain as all over however she is ambulatory without any difficulty and she does not appear in any discomfort. Note that Pallavi usually exhibits and physical expression of pain when she is indeed in crisis. She is given a prescription for Percocet 10-325 mg #24 for a 4 day supply to taper her care so that she minimizes the risk of withdrawal.   Pt also c/o dizziness during her hospital course after she had confined herself to bed for several days. Orthostatics were negative and she had no focal neurological changes and no associated symptoms. Pt was  encouraged to ambulate and after ambulating a few times the dizziness resolved. At the time of discharge, she was ambulating in the halls without difficulty and without any dizziness.  Pt has had behaviors of going to multiple and varied Emergency departments where she has received prescriptions for opiates. She also listed my name as her Provider with [facility] without my knowledge or permission. Pt has been going to OSH for care and demanded transfer to [facility] as she has informed the OSH on multiple occasions that I am her Physician. She reports that she requests transfer to [facility] with the expressed intent that her opiate prescriptions are written by me as that is the only way that they would be covered by her Payor source. The presence of a withdrawal syndrome when she is without opiates, as well as these illicit behaviors of obtaining opiates constitutes a diagnosis of opiate addiction. I-patient rehabilitation has been arranged for [facility] on multiple occasions and she has declined for varying reasons. SW was consulted but patient made it clear that she has in the past been told by other providers that she is addicted and did not want the help of anyone at this time. She is a client at [facility] and has been engaged with the SW there regarding rehab placement and counseling.   Pt has chronic hypotension and her BP remained at baseline throughout her hospitalization.  Her Hb was at baseline throughout her hospital course and Hydrea was continued without interruption.   Disposition and Follow-up: Pt is discharged in good condition and is to follow up with her PMD as needed.   Discharge Instructions    Activity as tolerated - No restrictions   Complete by:  As directed    Diet general   Complete by:  As directed       DISCHARGE EXAM:  General: Alert, awake, oriented x3, in mild distress.  HEENT: [place]/AT PEERL, EOMI, anicteric Neck: Trachea midline, no masses, no  thyromegal,y no JVD, no carotid bruit OROPHARYNX: Moist, No exudate/ erythema/lesions.  Heart: Regular rate and rhythm, without murmurs, rubs, gallops or S3. PMI non-displaced. Exam reveals no decreased pulses. Pulmonary/Chest: Normal effort. Breath sounds normal. No. Apnea. Clear to auscultation,no stridor,  no wheezing and no rhonchi noted. No respiratory distress and no tenderness noted. Abdomen: Soft, nontender, nondistended, normal bowel sounds, no masses no hepatosplenomegaly noted. No  fluid wave and no ascites. There is no guarding or rebound. Neuro: Alert and oriented to person, place and time. Normal motor skills, Displays no atrophy or tremors and exhibits normal muscle tone.  No focal neurological deficits noted cranial nerves II through XII grossly intact. No sensory deficit noted. Strength at baseline in bilateral upper and lower extremities. Gait normal. Musculoskeletal: No warmth swelling or erythema around joints, no spinal tenderness noted. Psychiatric: Patient alert and oriented x3, good recent to remote recall. Skin: Skin is warm and dry. No bruising, no ecchymosis and no rash noted. Pt is not diaphoretic. No erythema. No pallor   Blood pressure (!) 103/58, pulse 82, temperature 98.3 F (36.8 C), temperature source Oral, resp. rate 10, height 5' (1.524 m), weight 57.1 kg (125 lb 14.1 oz), SpO2 96 %.  Recent Labs    03/09/17 0533 03/10/17 0421  NA 137  --   K 3.5  --   CL 114*  --   CO2 17*  --   GLUCOSE 110*  --   BUN 8  --   CREATININE 0.62 0.53  CALCIUM 8.2*  --   MG 2.0  --    No results for input(s): AST, ALT, ALKPHOS, BILITOT, PROT, ALBUMIN in the last 72 hours. No results for input(s): LIPASE, AMYLASE in the last 72 hours. Recent Labs    03/10/17 0835  WBC 8.4  NEUTROABS 4.5  HGB 6.7*  HCT 18.7*  MCV 100.0  PLT 138*     Total time spent including face to face and decision making was greater than 30 minutes  Signed: Hadiya Spoerl  A. 03/11/2017, 11:49 AM

## 2017-03-11 NOTE — Care Management Note (Signed)
Case Management Note  Patient Details  Name: Brittany CampbellMiranda Gosa MRN: [id number] Date of Birth: 12/27/1985  Subjective/Objective:                    Action/Plan: Plan to discharge home with no needs   Expected Discharge Date:  03/11/17               Expected Discharge Plan:  Home/Self Care  In-House Referral:     Discharge planning Services  CM Consult  Post Acute Care Choice:    Choice offered to:     DME Arranged:    DME Agency:     HH Arranged:    HH Agency:     Status of Service:  In process, will continue to follow  If discussed at Long Length of Stay Meetings, dates discussed:    Additional CommentsGeni Bers:  Penda Venturi, RN 03/11/2017, 12:35 PM

## 2017-03-12 LAB — CATH TIP CULTURE: CULTURE: NO GROWTH

## 2017-03-16 ENCOUNTER — Telehealth (HOSPITAL_COMMUNITY): Payer: Self-pay | Admitting: *Deleted

## 2017-03-16 NOTE — Telephone Encounter (Signed)
Patient called requesting to be seen in day hospital. Patient complaining of pain in legs and hips rated 7/10. Patient reports last taking Hydrocodone this morning at 6:00. Selena BattenKim, FNP notified and advised patient to stay home, hydrate and take prescribed pain medications around the clock. Patient given this information. Patient expresses and understanding.

## 2017-03-18 ENCOUNTER — Telehealth (HOSPITAL_COMMUNITY): Payer: Self-pay | Admitting: *Deleted

## 2017-03-18 NOTE — Telephone Encounter (Signed)
Pt called c/o 7/10 pain to legs/hip/back. Pain began yesterday. Pt states she has been taking her regular medications. Took a Percocet at 0500 this morning and a muscle relaxant at 0700. Denies chest pain, NVD, abd pain, fever. Spoke with Armeniahina [name], NP. She advised that patient stay home, rest, continue taking her medications as ordered and drink plenty of water. Pt notified.  Ardyth GalAnderson, Milady Fleener Ann, RN 03/18/2017

## 2017-03-19 ENCOUNTER — Telehealth (HOSPITAL_COMMUNITY): Payer: Self-pay | Admitting: *Deleted

## 2017-03-19 NOTE — Telephone Encounter (Signed)
Patient called requesting to be seen in day hospital. Patient reports pain in back and hips rated 7/10. Patient reports last taking a muscle relaxer for pain at 7:00 am. Selena BattenKim, FNP notified and advised patient to hydrate, continue to take prescribed medications and rest at home. Patient updated with these instructions.

## 2017-03-20 MED ORDER — SODIUM CHLORIDE 0.9 % IV SOLN
INTRAVENOUS | Status: DC
Start: ? — End: 2017-03-20

## 2017-03-24 ENCOUNTER — Telehealth (HOSPITAL_COMMUNITY): Payer: Self-pay

## 2017-03-24 NOTE — Telephone Encounter (Signed)
[name] asking to come in for treatment. Reported sickle cell pain in her hips and ribcage. Rate pain 8/10. When asked when was the last time she medicated, patient stated she left emergency department 2:30 am this morning.  Denies fever, chest pain, nausea/vomiting, diarrhea and abdominal pain.  Provider advise she hydrate and continue to take home medication.

## 2017-03-26 ENCOUNTER — Telehealth (HOSPITAL_COMMUNITY): Payer: Self-pay | Admitting: Family Medicine

## 2017-03-26 NOTE — Telephone Encounter (Signed)
Informed patient that we are full to capacity and she will need to go back to ED if still having pain and needing pain management.  Patient verbalized understanding.    [facility]  Also requesting to come in on Monday (04/29/17) for pain management.  Will check and call patient back.    Brittany DillingJeannette Maylee Bare, BSN, RN-BC (MSN Graduate Student - [facility] [employer]

## 2017-03-26 NOTE — Telephone Encounter (Signed)
Triage assessment: Patient calling to c/o hips/ribs pain; pain 7/10.  Denies fever, chest pain, N/V, diarrhea, and abdominal pain.  Has had pain since yesterday.  Went to ED yesterday and last had Dilaudid at 9 or 10 pm.  Had muscle relaxant at 4:00 am today.  Ran out of pain meds yesterday.  Consulted with Julianne HandlerLaChina [name], NP - patient will need to f/u with pain mgmt or whoever prescribes her pain med.  Returned call to patient - states she only received pain medication from Dr. Ashley RoyaltyMatthews when she was seen ED.  Did not stay last night for "sickle cell crisis" and pain medication due to had no one to keep her daughter.  States she was told by ED to contact office for pain management for sickle cell crisis.  Will inform NP of additional info and call patient back.    Altamese Dilling~Halima Fogal, BSN, RN-BC (MSN Graduate Student - [facility] [employer] [employer])

## 2017-04-01 ENCOUNTER — Telehealth: Payer: Self-pay

## 2017-04-06 ENCOUNTER — Telehealth (HOSPITAL_COMMUNITY): Payer: Self-pay | Admitting: *Deleted

## 2017-04-06 NOTE — Telephone Encounter (Signed)
Patient called requesting to be seen in the day hospital. Patient reports pain in bilateral hips rated 7/10. Reports last taking Oxycodone 10 mg at 7:00 am. Selena BattenKim, FNP notified and advised that patient stay home, hydrate and continue to take prescribed pain medications. Patient notified and made aware of providers recommendation.

## 2017-04-08 ENCOUNTER — Telehealth (HOSPITAL_COMMUNITY): Payer: Self-pay | Admitting: *Deleted

## 2017-04-08 NOTE — Telephone Encounter (Signed)
Patient called requesting to be treated in day hospital for pain in hips and ribs rated 7/10. Patient reports last taking Oxycodone 10 mg "early this morning" (no specific time given). Patient recently discharge from the hospital. Armeniahina, FNP notified and advised patient to stay home, hydrate, continue to take prescribed pain medication and to make a appointment with her primary provider as soon as possible.  Patient given these instructions and expresses an understanding.

## 2017-04-09 ENCOUNTER — Telehealth: Payer: Self-pay

## 2017-04-09 MED ORDER — METHOCARBAMOL 500 MG PO TABS: 500.0000 mg | ORAL_TABLET | Freq: Four times a day (QID) | ORAL | 0 refills | Status: DC

## 2017-04-13 ENCOUNTER — Ambulatory Visit: Payer: Self-pay | Admitting: Family Medicine

## 2017-05-03 NOTE — Telephone Encounter (Signed)
Patient called, complained of pain in the hips and ribs that she rated as 7/10. Denied, fever, diarrhea, abdominal pain, vomiting admitted some nausea. Last took oxycodone 10 mg at 7am today. Provider notified; per provider, asked to allow time for the medication to kick in, stay hydrated 64 ounces of water today, take pain medications as scheduled and call back tomorrow if  there is no improvement. Patient notified, expressed understanding.

## 2017-05-07 IMAGING — CR DG CHEST 2V
2 series · 2 of 2 positions shown · non-contrast
Comparison: 11/04/2015

CLINICAL DATA: Generalized chest pain for 2 days. History of sickle
cell.

EXAM:
CHEST  2 VIEW

[w chest pa]
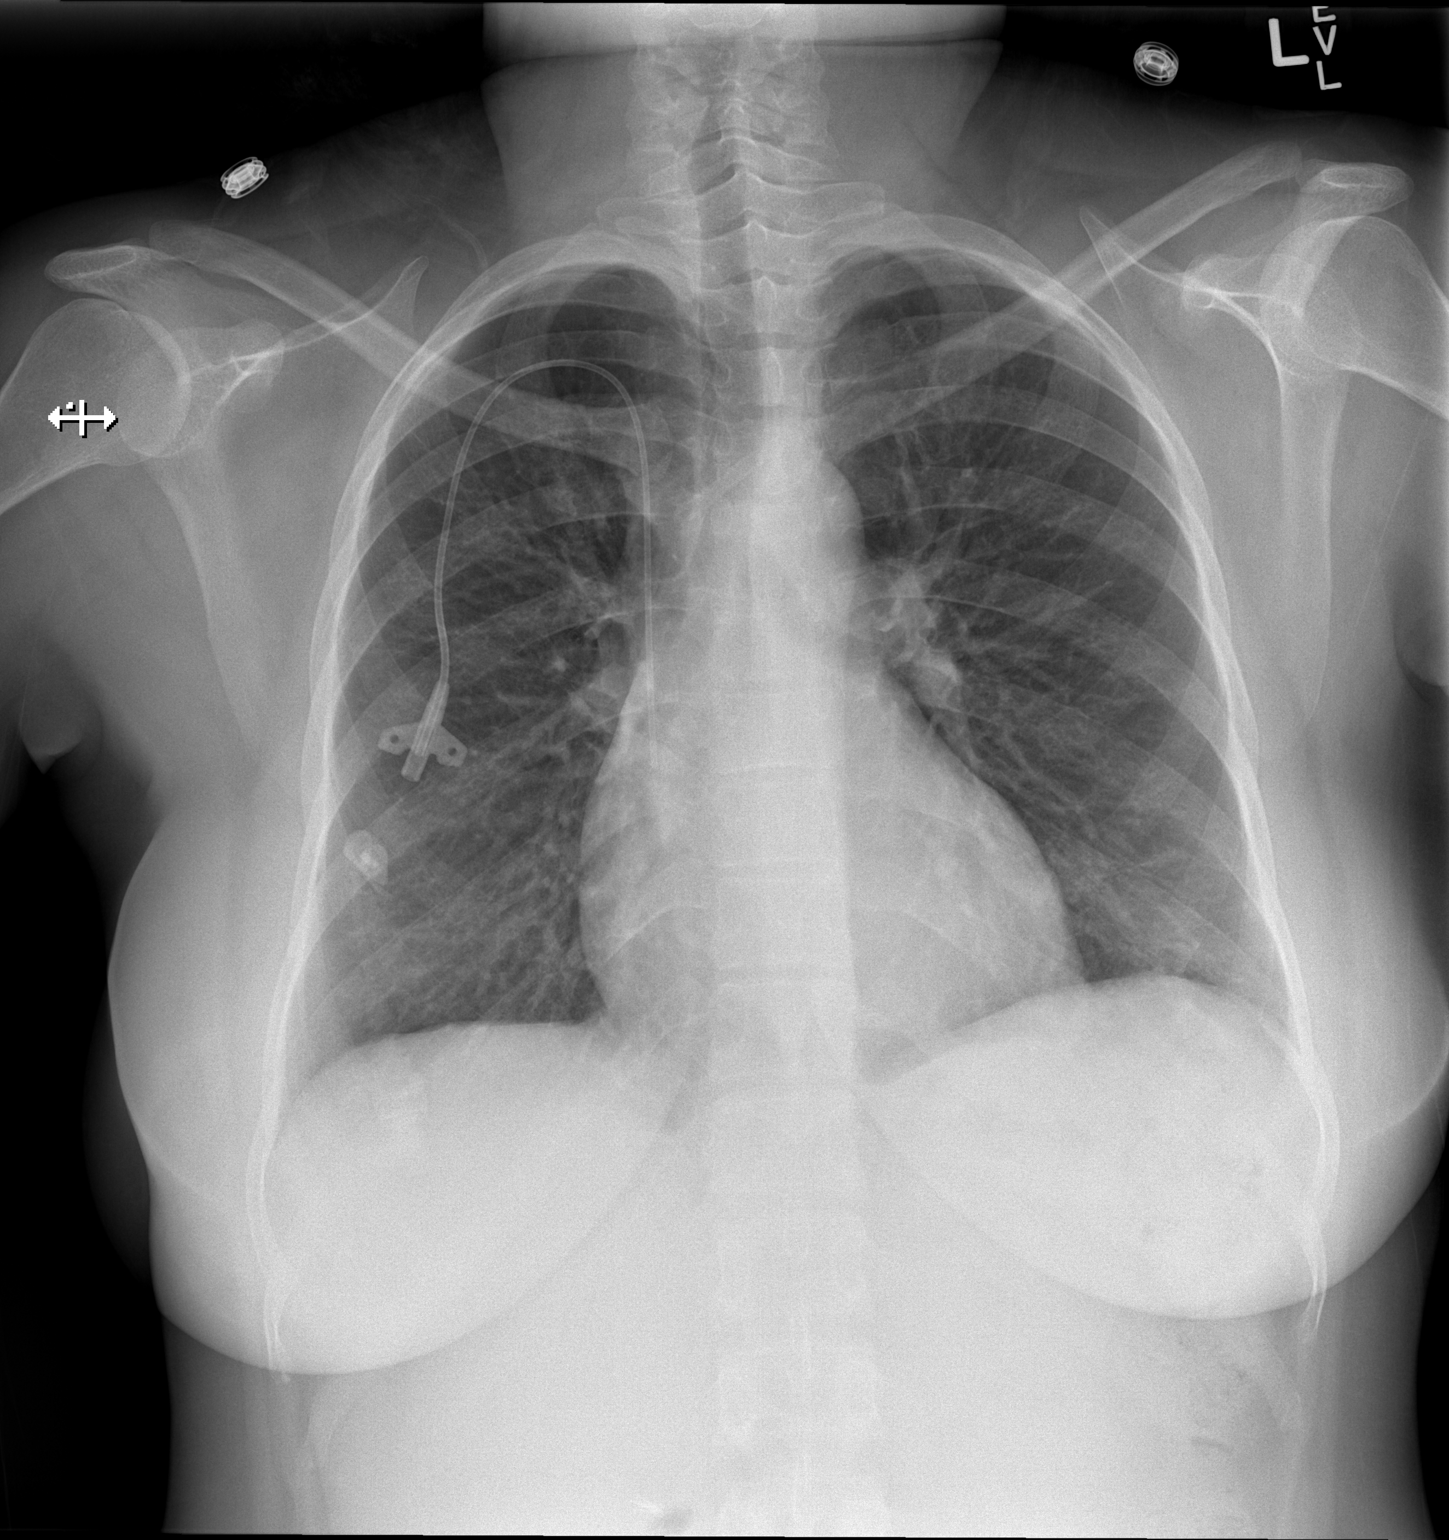

[w chest lat]
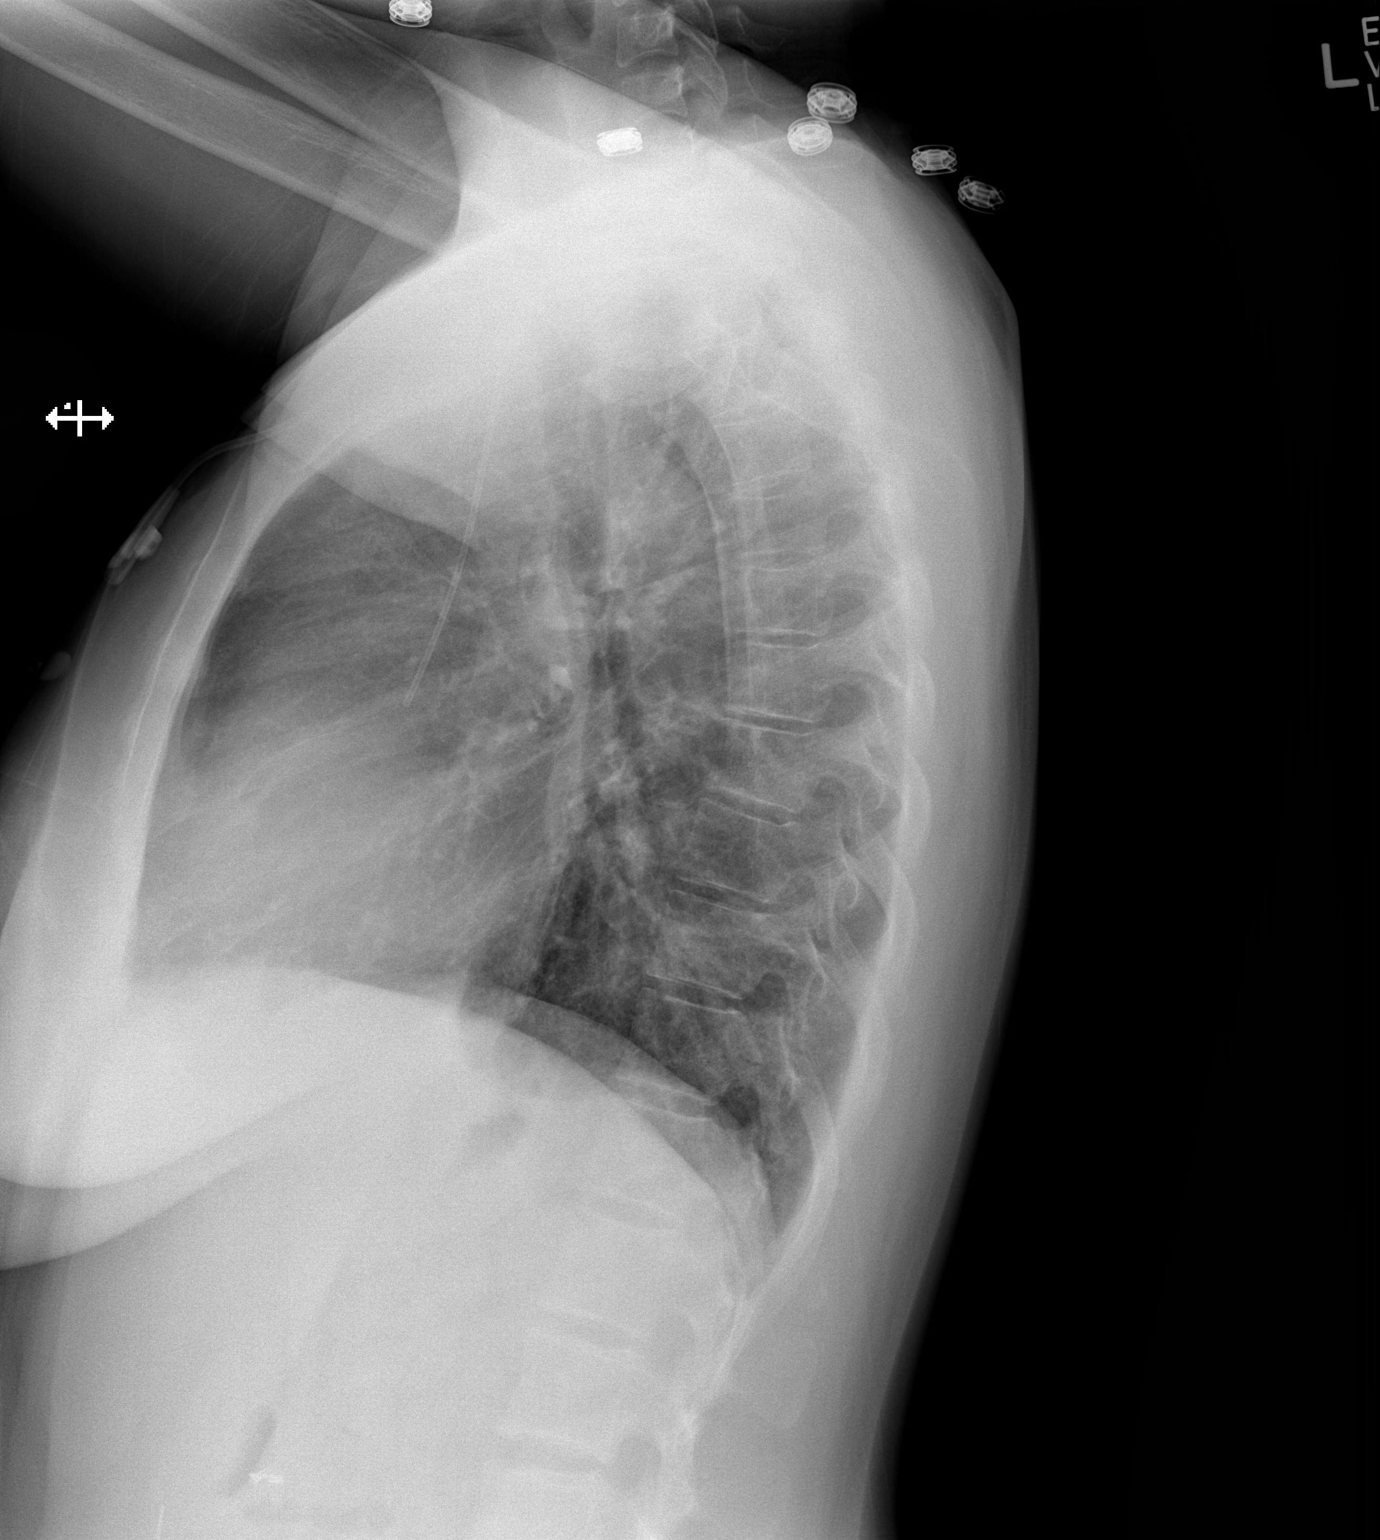

[2 of 2 positions shown; findings below may reference images not displayed]

FINDINGS: Right central venous catheter with tip over the cavoatrial junction.
No pneumothorax. Normal heart size and pulmonary vascularity. No
focal airspace disease or consolidation in the lungs. No blunting of
costophrenic angles. No pneumothorax. Mediastinal contours appear
intact.
IMPRESSION: No active cardiopulmonary disease.

## 2017-05-13 ENCOUNTER — Telehealth (HOSPITAL_COMMUNITY): Payer: Self-pay | Admitting: General Practice

## 2017-05-13 MED ORDER — SODIUM CHLORIDE 0.9 % IV SOLN
INTRAVENOUS | Status: DC
Start: ? — End: 2017-05-13

## 2017-05-13 MED ORDER — GENERIC EXTERNAL MEDICATION
Status: DC
Start: ? — End: 2017-05-13

## 2017-05-13 NOTE — Telephone Encounter (Signed)
Patient called, complained of pain in the hip and stomach that she rated as 8/10. Denied, fever, diarrhea. Admitted to stomach and abdominal pain, admitted some nausea/vomitting x1 yesterday. Did not take any medication for the N & V. Last took oxycodone at 5 am today. Provider notified; per provider, asked to go to the ER due to the stomach pain. Patient notified. Immediately I notified the patient, she hung up the phone.

## 2017-05-14 ENCOUNTER — Telehealth (HOSPITAL_COMMUNITY): Payer: Self-pay | Admitting: *Deleted

## 2017-05-14 NOTE — Telephone Encounter (Signed)
Pt called requesting treatment at day hospital for sickle cell pain. C/o pain to legs and hips, 7/10. Notified provider, Armeniahina [name] FNP. After reviewing recent lab work and previous treatment yesterday in the ED, FNP instructed that patient remain home today, take her regular medications and hydrate well.  Pt notified.  Ardyth GalAnderson, Margrette Wynia Ann, RN 05/14/2017

## 2017-05-27 NOTE — Telephone Encounter (Signed)
Note not needed 

## 2017-06-01 MED ORDER — LACTULOSE 10 GM/15ML PO SOLN
20.00 | ORAL | Status: DC
Start: ? — End: 2017-06-01

## 2017-06-01 MED ORDER — GENERIC EXTERNAL MEDICATION
1.00 | Status: DC
Start: 2017-05-30 — End: 2017-06-01

## 2017-06-01 MED ORDER — ANTACID & ANTIGAS 200-200-20 MG/5ML PO SUSP
30.00 | ORAL | Status: DC
Start: ? — End: 2017-06-01

## 2017-06-01 MED ORDER — ARIPIPRAZOLE 10 MG PO TABS
10.00 | ORAL_TABLET | ORAL | Status: DC
Start: 2017-05-31 — End: 2017-06-01

## 2017-06-01 MED ORDER — MELATONIN 3 MG PO TABS
3.00 | ORAL_TABLET | ORAL | Status: DC
Start: ? — End: 2017-06-01

## 2017-06-01 MED ORDER — GENERIC EXTERNAL MEDICATION
Status: DC
Start: ? — End: 2017-06-01

## 2017-06-01 MED ORDER — DULOXETINE HCL 30 MG PO CPEP
60.00 | ORAL_CAPSULE | ORAL | Status: DC
Start: 2017-05-31 — End: 2017-06-01

## 2017-06-01 MED ORDER — NITROGLYCERIN 0.4 MG SL SUBL
0.40 | SUBLINGUAL_TABLET | SUBLINGUAL | Status: DC
Start: ? — End: 2017-06-01

## 2017-06-01 MED ORDER — HYDROMORPHONE HCL 2 MG PO TABS
4.00 | ORAL_TABLET | ORAL | Status: DC
Start: ? — End: 2017-06-01

## 2017-06-01 MED ORDER — GENERIC EXTERNAL MEDICATION
2.00 | Status: DC
Start: 2017-05-30 — End: 2017-06-01

## 2017-06-01 MED ORDER — KETOROLAC TROMETHAMINE 15 MG/ML IJ SOLN
15.00 | INTRAMUSCULAR | Status: DC
Start: 2017-05-30 — End: 2017-06-01

## 2017-06-01 MED ORDER — SODIUM CHLORIDE 0.9 % IV SOLN
INTRAVENOUS | Status: DC
Start: ? — End: 2017-06-01

## 2017-06-01 MED ORDER — PROCHLORPERAZINE EDISYLATE 5 MG/ML IJ SOLN
10.00 | INTRAMUSCULAR | Status: DC
Start: ? — End: 2017-06-01

## 2017-06-01 MED ORDER — FOLIC ACID 1 MG PO TABS
1.00 | ORAL_TABLET | ORAL | Status: DC
Start: 2017-05-31 — End: 2017-06-01

## 2017-06-01 MED ORDER — TOPIRAMATE 25 MG PO TABS
50.00 | ORAL_TABLET | ORAL | Status: DC
Start: 2017-05-30 — End: 2017-06-01

## 2017-06-01 MED ORDER — ACETAMINOPHEN 325 MG PO TABS
650.00 | ORAL_TABLET | ORAL | Status: DC
Start: ? — End: 2017-06-01

## 2017-06-01 MED ORDER — ENOXAPARIN SODIUM 40 MG/0.4ML ~~LOC~~ SOLN
40.00 | SUBCUTANEOUS | Status: DC
Start: 2017-05-30 — End: 2017-06-01

## 2017-06-01 MED ORDER — HYDROXYUREA 500 MG PO CAPS
500.00 | ORAL_CAPSULE | ORAL | Status: DC
Start: 2017-05-31 — End: 2017-06-01

## 2017-06-01 MED ORDER — TRAZODONE HCL 50 MG PO TABS
50.00 | ORAL_TABLET | ORAL | Status: DC
Start: ? — End: 2017-06-01

## 2017-06-01 MED ORDER — DIPHENHYDRAMINE HCL 25 MG PO CAPS
25.00 | ORAL_CAPSULE | ORAL | Status: DC
Start: ? — End: 2017-06-01

## 2017-06-21 IMAGING — CR DG CHEST 2V
2 series · 2 of 2 positions shown · non-contrast
Comparison: 11/21/2015

CLINICAL DATA: Sickle-cell crisis

EXAM:
CHEST  2 VIEW

[w chest pa]
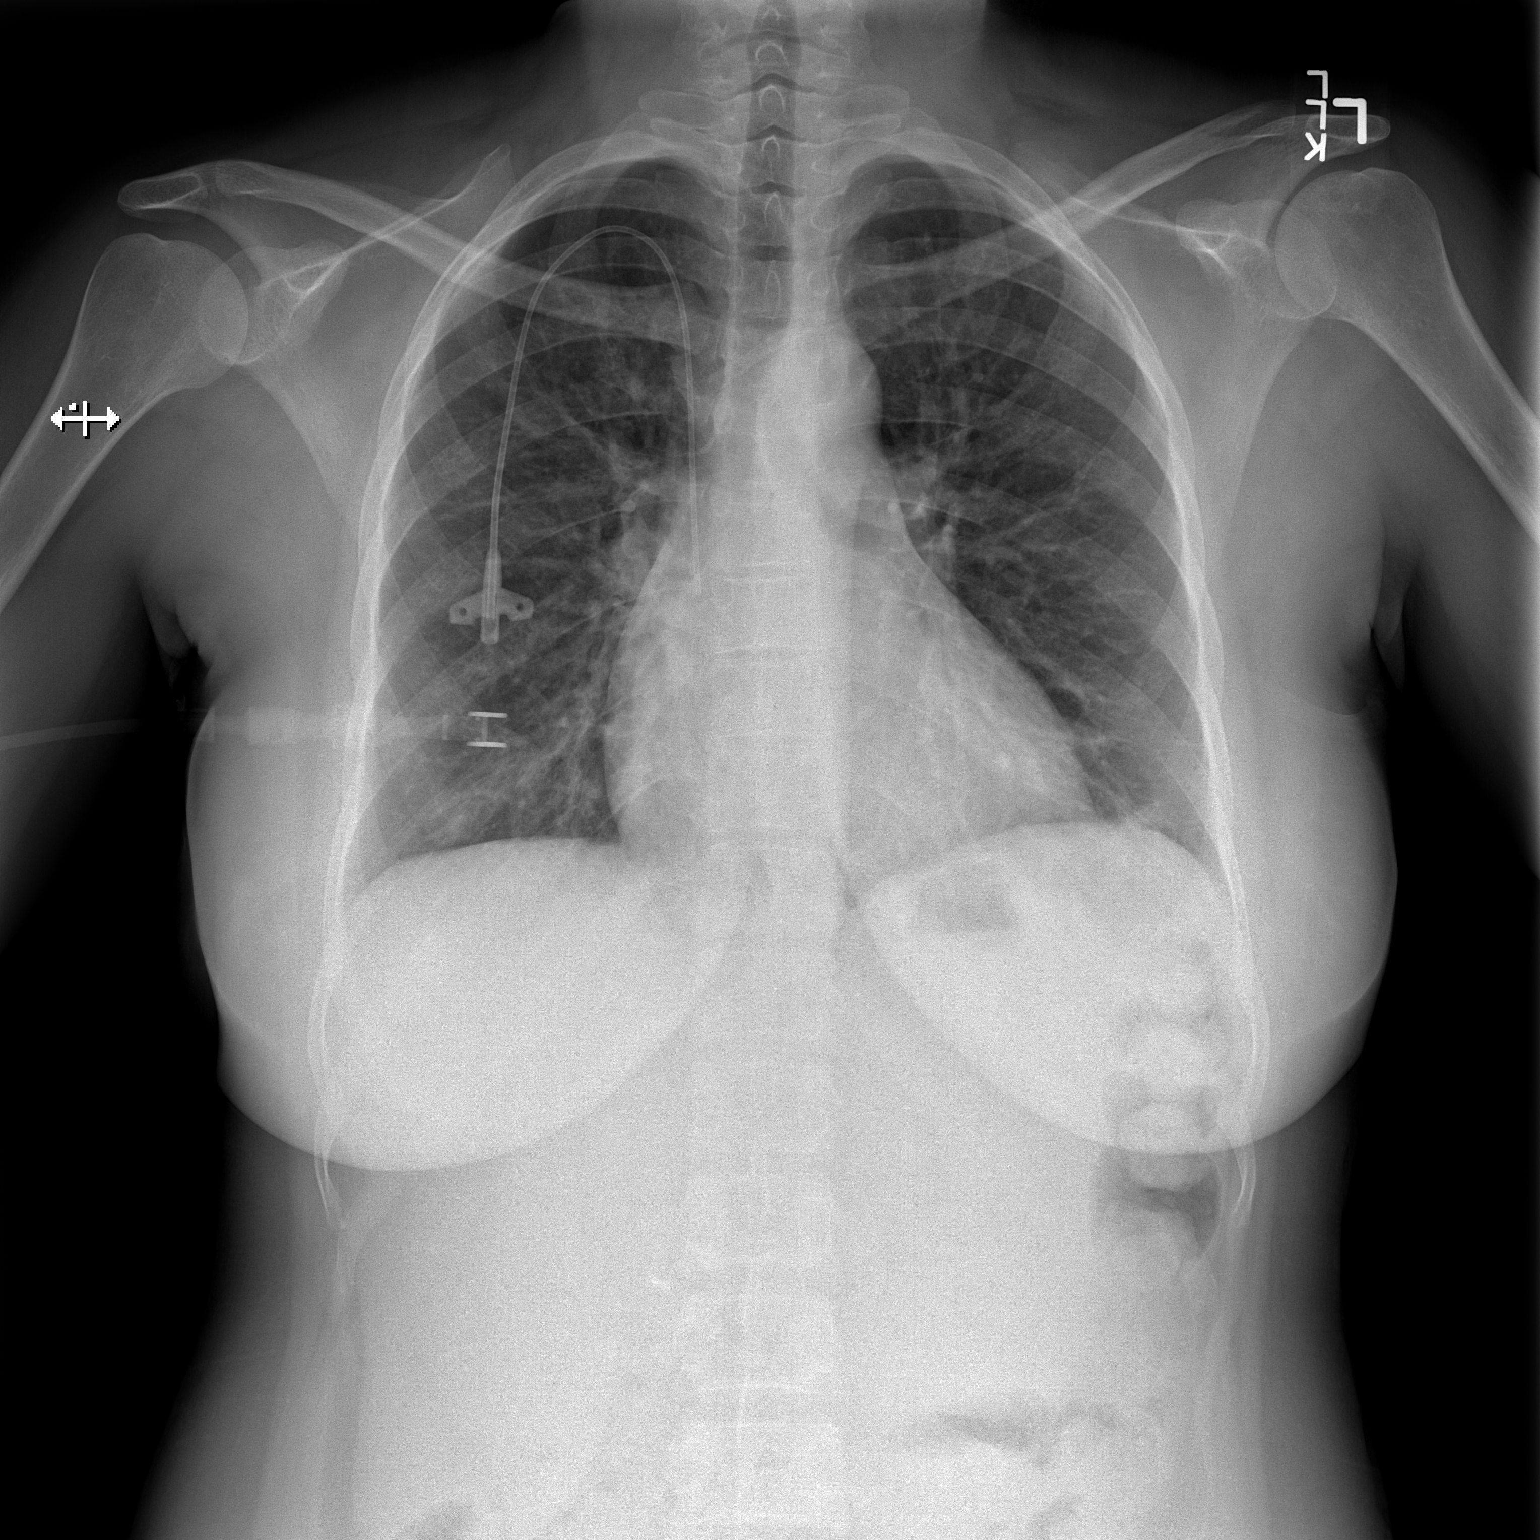

[w chest lat]
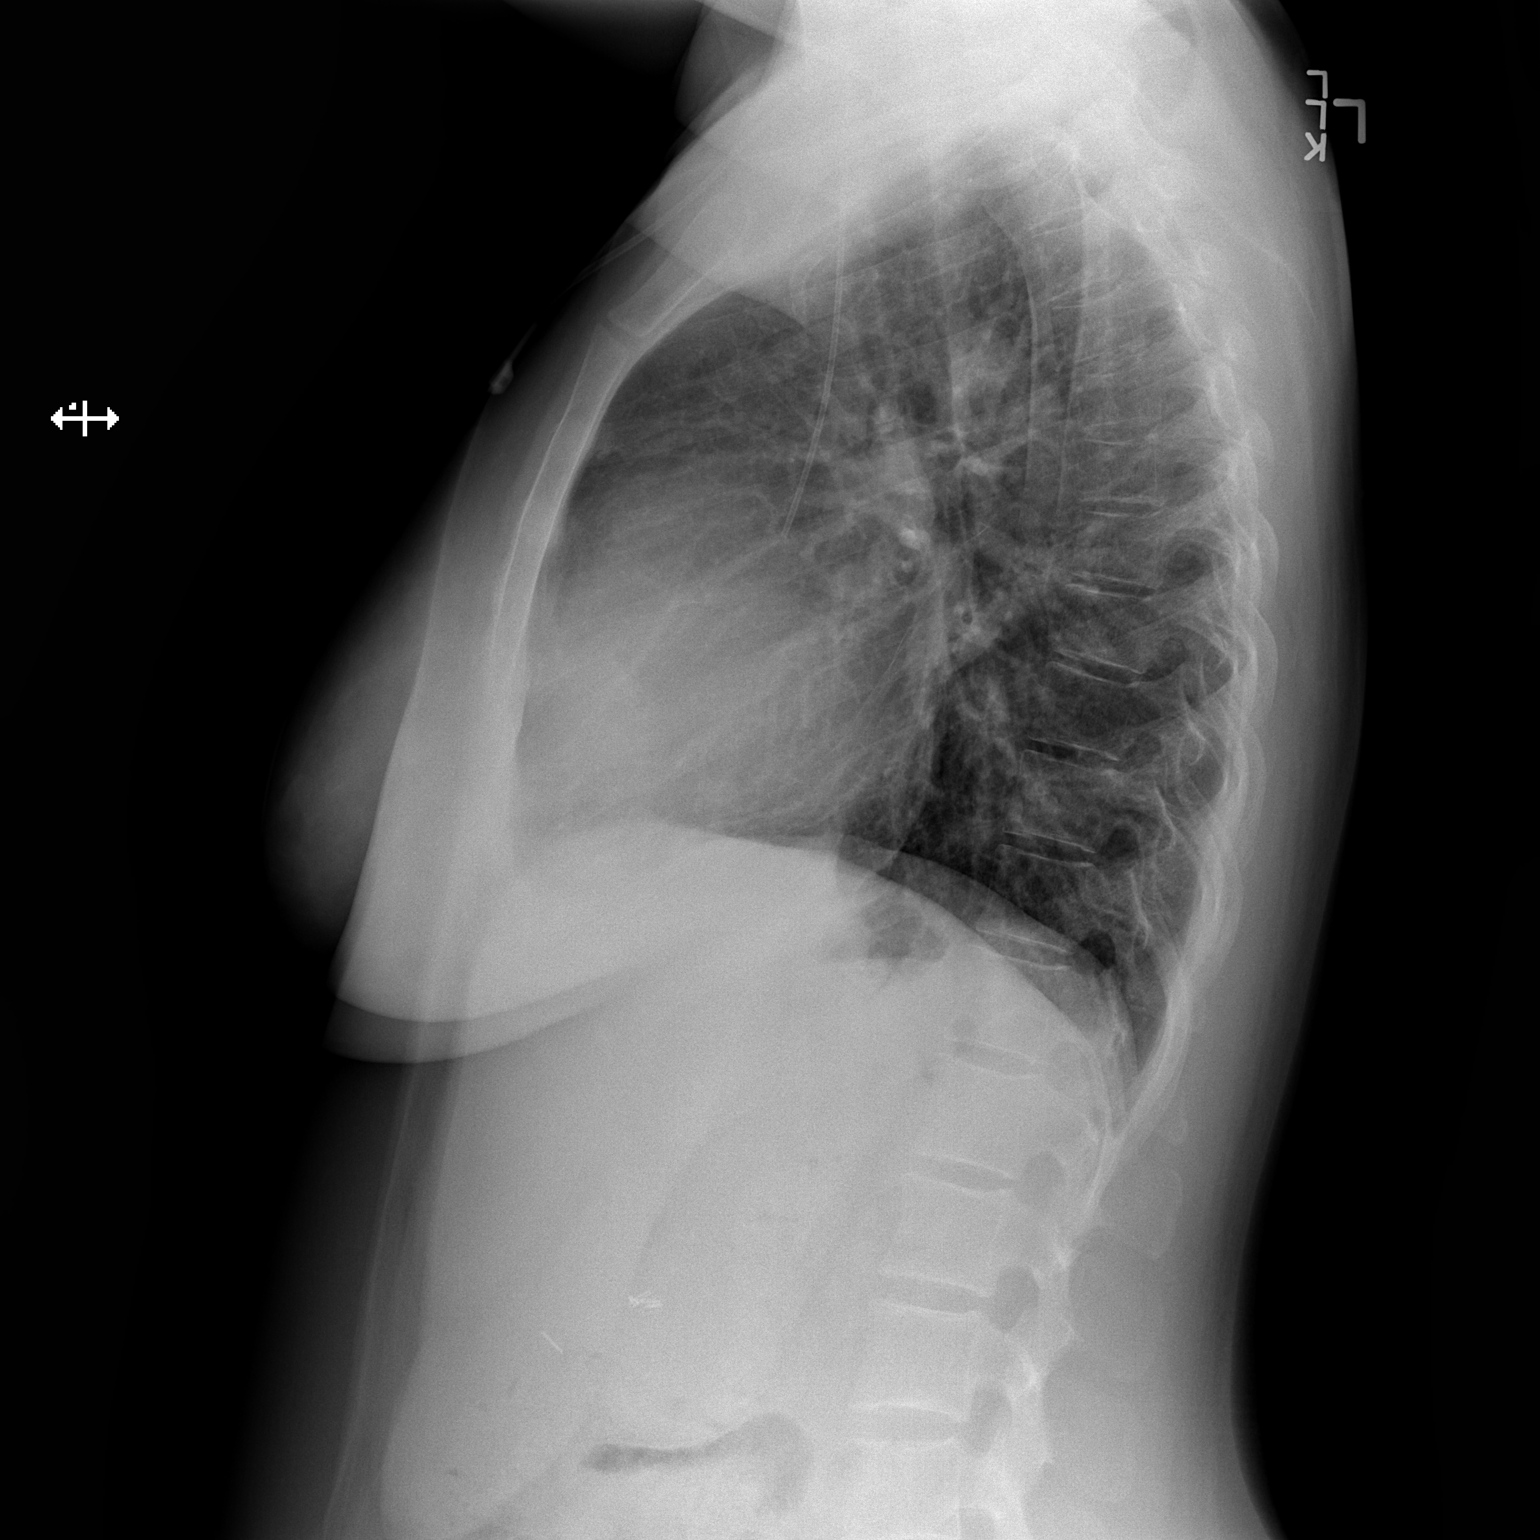

[2 of 2 positions shown; findings below may reference images not displayed]

FINDINGS: The heart size and mediastinal contours are within normal limits.
Right central venous catheter tip is seen at the cavoatrial junction
as before. Both lungs are clear. The visualized skeletal structures
are unremarkable. Cholecystectomy clips in the right upper quadrant.
IMPRESSION: No active cardiopulmonary disease.

## 2017-06-24 ENCOUNTER — Telehealth (HOSPITAL_COMMUNITY): Payer: Self-pay | Admitting: General Practice

## 2017-06-24 NOTE — Telephone Encounter (Signed)
Patient called wanting to know if she can be triaged in advance  to come in toimorrow. Per provider she needs to call back tomorrow morning. Patient notified, verbalized understanding,

## 2017-07-10 MED ORDER — NITROGLYCERIN 0.4 MG SL SUBL
0.40 | SUBLINGUAL_TABLET | SUBLINGUAL | Status: DC
Start: ? — End: 2017-07-10

## 2017-07-10 MED ORDER — LACTATED RINGERS IV SOLN
200.00 | INTRAVENOUS | Status: DC
Start: ? — End: 2017-07-10

## 2017-07-10 MED ORDER — GENERIC EXTERNAL MEDICATION
Status: DC
Start: ? — End: 2017-07-10

## 2017-07-10 MED ORDER — DIPHENHYDRAMINE HCL 25 MG PO CAPS
50.00 | ORAL_CAPSULE | ORAL | Status: DC
Start: ? — End: 2017-07-10

## 2017-07-10 MED ORDER — OXYCODONE HCL ER 10 MG PO T12A
10.00 | EXTENDED_RELEASE_TABLET | ORAL | Status: DC
Start: 2017-07-08 — End: 2017-07-10

## 2017-07-10 MED ORDER — ARIPIPRAZOLE 10 MG PO TABS
10.00 | ORAL_TABLET | ORAL | Status: DC
Start: 2017-07-09 — End: 2017-07-10

## 2017-07-10 MED ORDER — TOPIRAMATE 25 MG PO TABS
50.00 | ORAL_TABLET | ORAL | Status: DC
Start: 2017-07-08 — End: 2017-07-10

## 2017-07-10 MED ORDER — FOLIC ACID 1 MG PO TABS
1.00 | ORAL_TABLET | ORAL | Status: DC
Start: 2017-07-09 — End: 2017-07-10

## 2017-07-10 MED ORDER — OXYCODONE HCL 5 MG PO TABS
5.00 | ORAL_TABLET | ORAL | Status: DC
Start: ? — End: 2017-07-10

## 2017-07-10 MED ORDER — SODIUM CHLORIDE 0.9 % IV SOLN
10.00 | INTRAVENOUS | Status: DC
Start: ? — End: 2017-07-10

## 2017-07-10 MED ORDER — DULOXETINE HCL 30 MG PO CPEP
60.00 | ORAL_CAPSULE | ORAL | Status: DC
Start: 2017-07-09 — End: 2017-07-10

## 2017-07-10 MED ORDER — KETOROLAC TROMETHAMINE 30 MG/ML IJ SOLN
30.00 | INTRAMUSCULAR | Status: DC
Start: 2017-07-08 — End: 2017-07-10

## 2017-07-10 MED ORDER — PROMETHAZINE HCL 25 MG/ML IJ SOLN
12.50 | INTRAMUSCULAR | Status: DC
Start: ? — End: 2017-07-10

## 2017-07-10 MED ORDER — TRAZODONE HCL 50 MG PO TABS
50.00 | ORAL_TABLET | ORAL | Status: DC
Start: ? — End: 2017-07-10

## 2017-08-22 IMAGING — DX DG CHEST 2V
2 series · 2 of 2 positions shown · non-contrast
Comparison: Two days ago

CLINICAL DATA: Sickle cell pain crisis.  Shortness of breath

EXAM:
CHEST  2 VIEW

[chest pa]
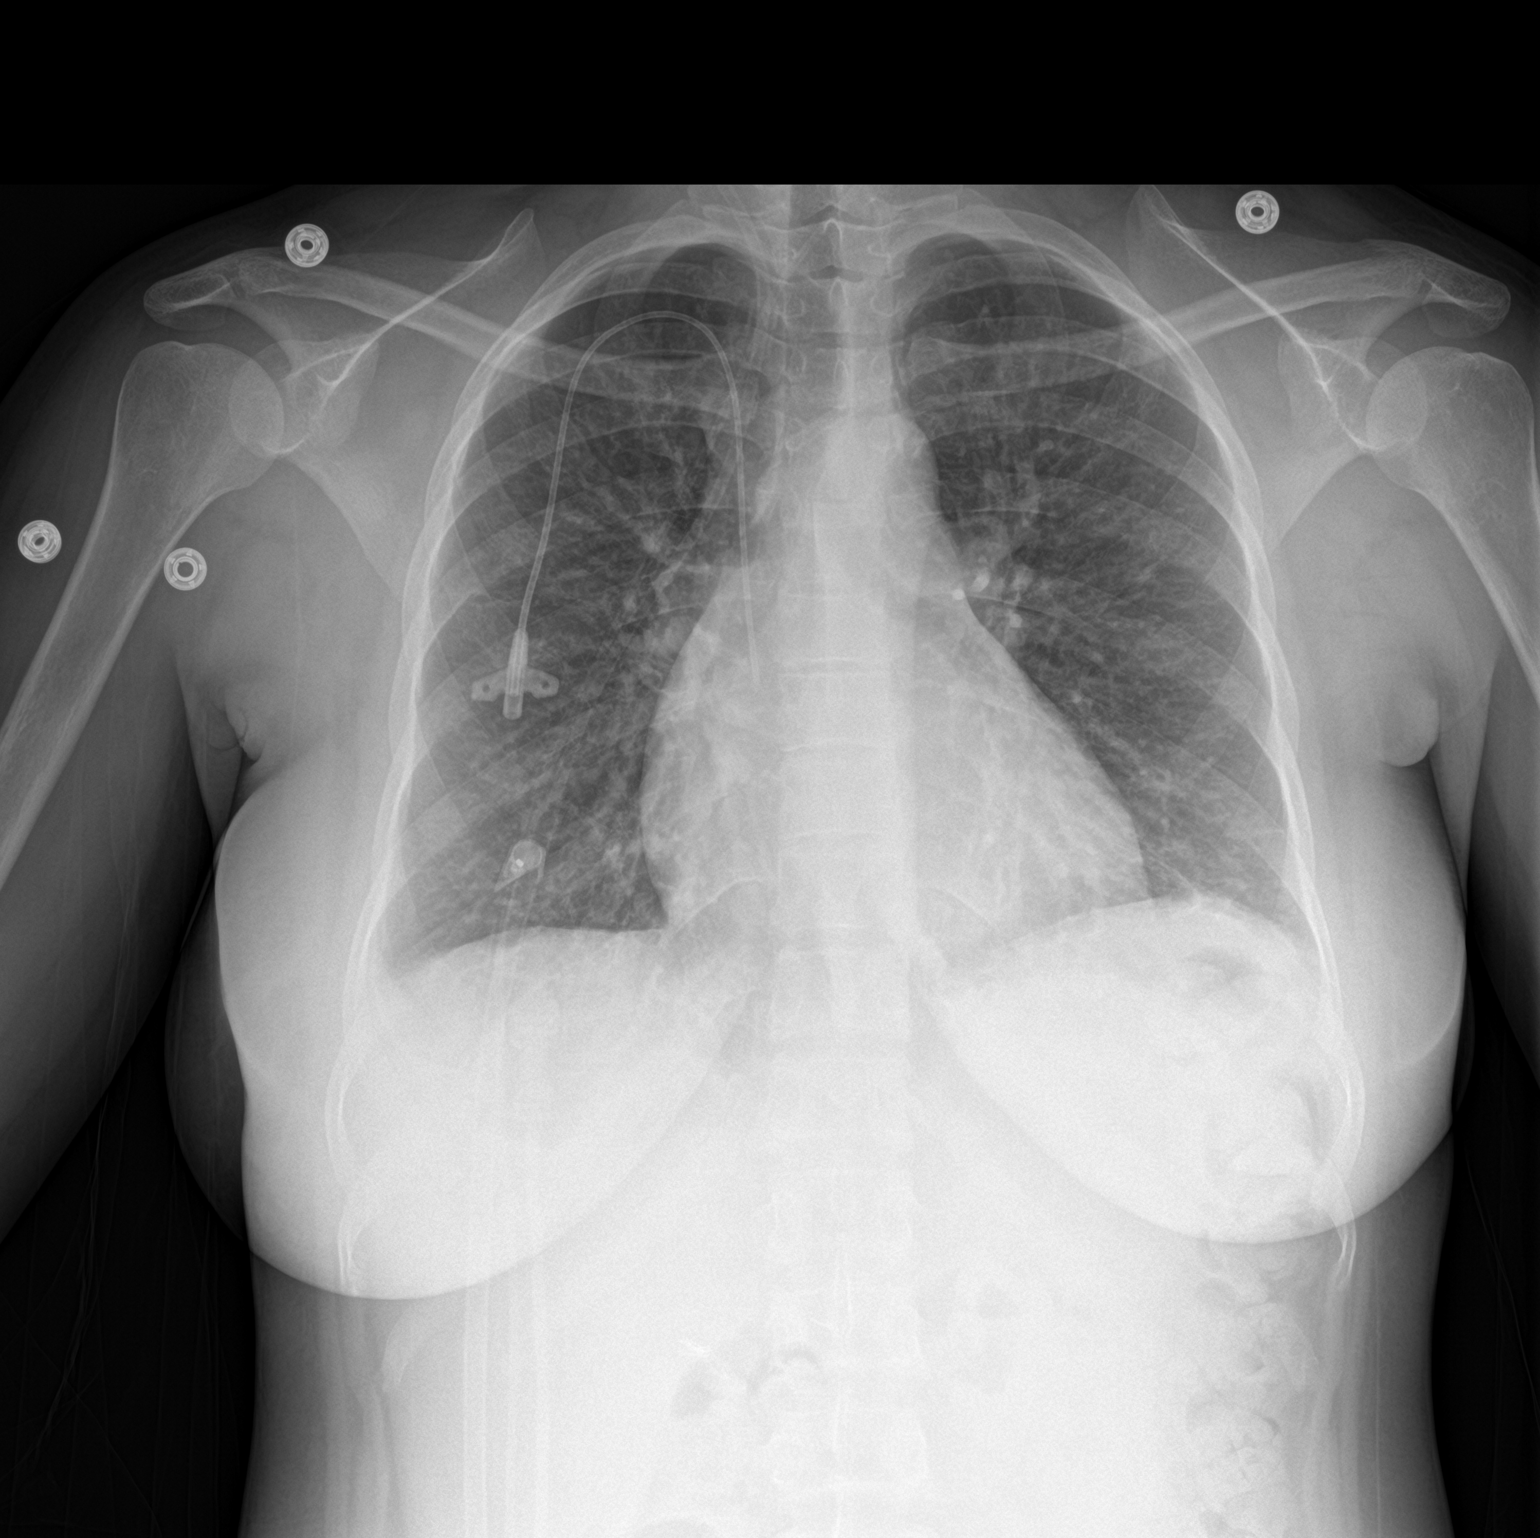

[chest lat]
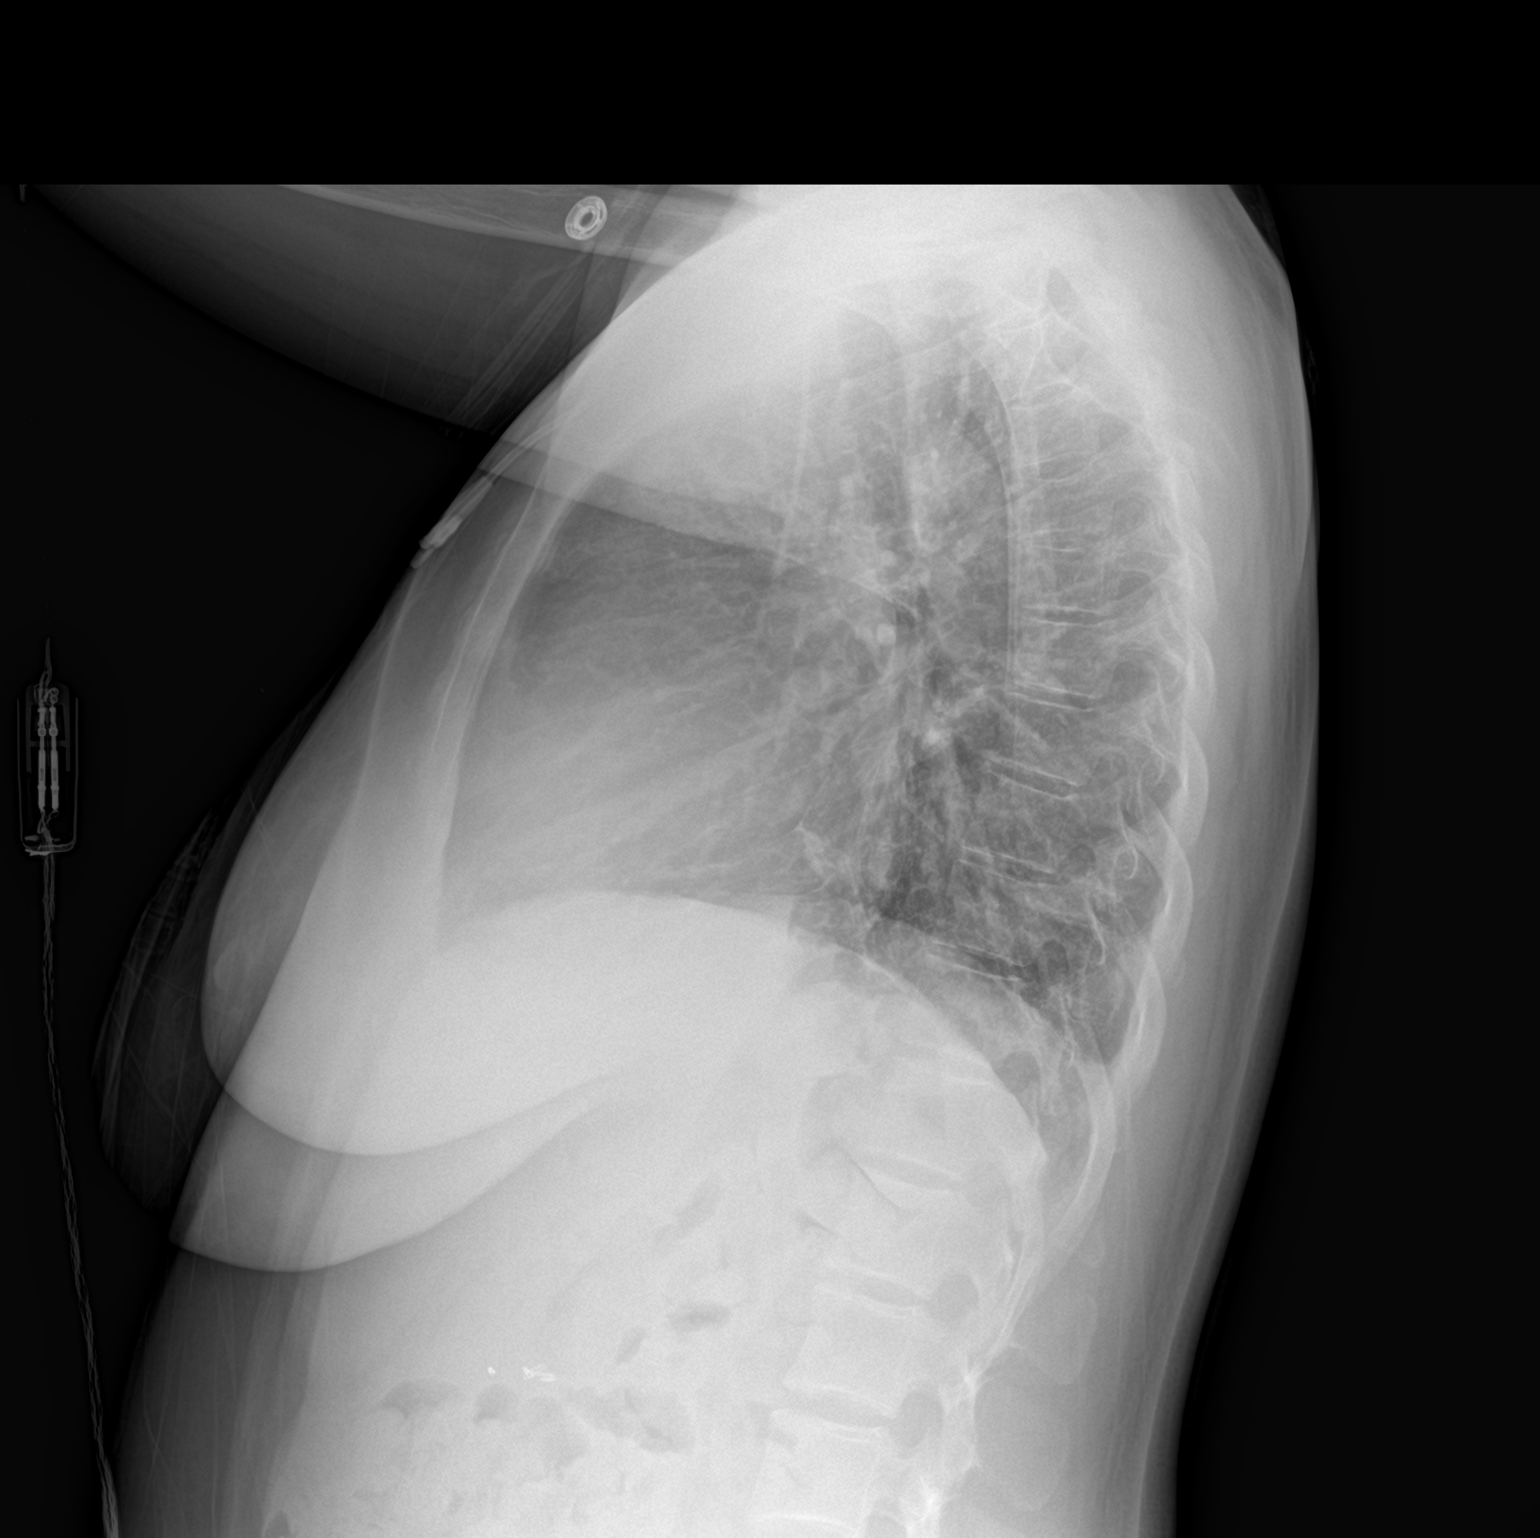

[2 of 2 positions shown; findings below may reference images not displayed]

FINDINGS: New blunting of the right costophrenic sulcus. No consolidating
airspace disease. Mild linear opacity overlapping the posterior
heart on the lateral projection is likely atelectasis. Chronic
interstitial coarsening is stable. Prominent formed stool in the
upper abdomen. Normal heart size and mediastinal contours. Central
line on the right with tip at the upper right atrium.
IMPRESSION: Trace right pleural fluid that is new from 2 days ago. No
consolidating airspace disease.

## 2017-09-19 MED ORDER — ACETAMINOPHEN 500 MG PO TABS
1000.00 | ORAL_TABLET | ORAL | Status: DC
Start: ? — End: 2017-09-19

## 2017-09-19 MED ORDER — DULOXETINE HCL 60 MG PO CPEP
60.00 | ORAL_CAPSULE | ORAL | Status: DC
Start: 2017-09-18 — End: 2017-09-19

## 2017-09-19 MED ORDER — ARIPIPRAZOLE 5 MG PO TABS
10.00 | ORAL_TABLET | ORAL | Status: DC
Start: 2017-09-18 — End: 2017-09-19

## 2017-09-19 MED ORDER — GENERIC EXTERNAL MEDICATION
Status: DC
Start: ? — End: 2017-09-19

## 2017-09-19 MED ORDER — FOLIC ACID 1 MG PO TABS
1.00 | ORAL_TABLET | ORAL | Status: DC
Start: 2017-09-18 — End: 2017-09-19

## 2017-09-19 MED ORDER — OXYCODONE HCL 10 MG PO TABS
10.00 | ORAL_TABLET | ORAL | Status: DC
Start: ? — End: 2017-09-19

## 2017-09-19 MED ORDER — DIPHENHYDRAMINE HCL 25 MG PO CAPS
25.00 | ORAL_CAPSULE | ORAL | Status: DC
Start: ? — End: 2017-09-19

## 2017-09-19 MED ORDER — TOPIRAMATE 25 MG PO TABS
50.00 | ORAL_TABLET | ORAL | Status: DC
Start: 2017-09-17 — End: 2017-09-19

## 2017-09-19 MED ORDER — SODIUM CHLORIDE 0.9 % IV SOLN
10.00 | INTRAVENOUS | Status: DC
Start: ? — End: 2017-09-19

## 2017-09-19 MED ORDER — ENOXAPARIN SODIUM 40 MG/0.4ML ~~LOC~~ SOLN
40.00 | SUBCUTANEOUS | Status: DC
Start: 2017-09-18 — End: 2017-09-19

## 2017-09-19 MED ORDER — OXYCODONE HCL ER 10 MG PO T12A
10.00 | EXTENDED_RELEASE_TABLET | ORAL | Status: DC
Start: 2017-09-17 — End: 2017-09-19

## 2017-09-19 MED ORDER — GENERIC EXTERNAL MEDICATION
1.00 | Status: DC
Start: 2017-09-18 — End: 2017-09-19

## 2017-09-19 MED ORDER — LACTATED RINGERS IV SOLN
75.00 | INTRAVENOUS | Status: DC
Start: ? — End: 2017-09-19

## 2017-09-19 MED ORDER — TRAZODONE HCL 50 MG PO TABS
50.00 | ORAL_TABLET | ORAL | Status: DC
Start: ? — End: 2017-09-19

## 2017-09-19 MED ORDER — GENERIC EXTERNAL MEDICATION
750.00 | Status: DC
Start: 2017-09-17 — End: 2017-09-19

## 2017-10-20 ENCOUNTER — Other Ambulatory Visit: Payer: Self-pay | Admitting: Internal Medicine

## 2017-10-26 MED ORDER — GENERIC EXTERNAL MEDICATION
10.00 | Status: DC
Start: ? — End: 2017-10-26

## 2017-10-26 MED ORDER — SODIUM CHLORIDE 0.9 % IV SOLN
10.00 | INTRAVENOUS | Status: DC
Start: ? — End: 2017-10-26

## 2017-11-07 MED ORDER — POLYETHYLENE GLYCOL 3350 17 G PO PACK
17.00 | PACK | ORAL | Status: DC
Start: ? — End: 2017-11-07

## 2017-11-07 MED ORDER — GENERIC EXTERNAL MEDICATION
Status: DC
Start: ? — End: 2017-11-07

## 2017-11-07 MED ORDER — CELECOXIB 100 MG PO CAPS
100.00 | ORAL_CAPSULE | ORAL | Status: DC
Start: 2017-11-05 — End: 2017-11-07

## 2017-11-07 MED ORDER — THERA PO TABS
1.00 | ORAL_TABLET | ORAL | Status: DC
Start: 2017-11-06 — End: 2017-11-07

## 2017-11-07 MED ORDER — ALUM & MAG HYDROXIDE-SIMETH 200-200-20 MG/5ML PO SUSP
30.00 | ORAL | Status: DC
Start: ? — End: 2017-11-07

## 2017-11-07 MED ORDER — PROCHLORPERAZINE EDISYLATE 10 MG/2ML IJ SOLN
5.00 | INTRAMUSCULAR | Status: DC
Start: ? — End: 2017-11-07

## 2017-11-07 MED ORDER — ENOXAPARIN SODIUM 40 MG/0.4ML ~~LOC~~ SOLN
40.00 | SUBCUTANEOUS | Status: DC
Start: 2017-11-06 — End: 2017-11-07

## 2017-11-07 MED ORDER — NITROGLYCERIN 0.4 MG SL SUBL
0.40 | SUBLINGUAL_TABLET | SUBLINGUAL | Status: DC
Start: ? — End: 2017-11-07

## 2017-11-07 MED ORDER — TOPIRAMATE 25 MG PO TABS
50.00 | ORAL_TABLET | ORAL | Status: DC
Start: 2017-11-05 — End: 2017-11-07

## 2017-11-07 MED ORDER — SUCRALFATE 1 GM/10ML PO SUSP
1.00 | ORAL | Status: DC
Start: ? — End: 2017-11-07

## 2017-11-07 MED ORDER — DIPHENHYDRAMINE HCL 25 MG PO CAPS
25.00 | ORAL_CAPSULE | ORAL | Status: DC
Start: ? — End: 2017-11-07

## 2017-11-07 MED ORDER — KCL IN DEXTROSE-NACL 20-5-0.45 MEQ/L-%-% IV SOLN
75.00 | INTRAVENOUS | Status: DC
Start: ? — End: 2017-11-07

## 2017-11-07 MED ORDER — ARIPIPRAZOLE 10 MG PO TABS
10.00 | ORAL_TABLET | ORAL | Status: DC
Start: 2017-11-06 — End: 2017-11-07

## 2017-11-07 MED ORDER — DULOXETINE HCL 60 MG PO CPEP
60.00 | ORAL_CAPSULE | ORAL | Status: DC
Start: 2017-11-06 — End: 2017-11-07

## 2017-11-07 MED ORDER — SODIUM CHLORIDE 0.9 % IV SOLN
10.00 | INTRAVENOUS | Status: DC
Start: ? — End: 2017-11-07

## 2017-11-07 MED ORDER — HYDROXYUREA 500 MG PO CAPS
500.00 | ORAL_CAPSULE | ORAL | Status: DC
Start: 2017-11-06 — End: 2017-11-07

## 2017-11-07 MED ORDER — ACETAMINOPHEN 325 MG PO TABS
650.00 | ORAL_TABLET | ORAL | Status: DC
Start: 2017-11-05 — End: 2017-11-07

## 2017-11-07 MED ORDER — GUAIFENESIN 100 MG/5ML PO SYRP
200.00 | ORAL_SOLUTION | ORAL | Status: DC
Start: ? — End: 2017-11-07

## 2017-11-07 MED ORDER — FOLIC ACID 1 MG PO TABS
1.00 | ORAL_TABLET | ORAL | Status: DC
Start: 2017-11-06 — End: 2017-11-07

## 2017-11-07 MED ORDER — GENERIC EXTERNAL MEDICATION
10.00 | Status: DC
Start: ? — End: 2017-11-07

## 2017-11-07 MED ORDER — TRAZODONE HCL 50 MG PO TABS
50.00 | ORAL_TABLET | ORAL | Status: DC
Start: ? — End: 2017-11-07

## 2018-04-26 ENCOUNTER — Telehealth: Payer: Self-pay

## 2018-04-26 NOTE — Telephone Encounter (Signed)
NOTES ON FILE RJ 

## 2018-06-28 NOTE — Telephone Encounter (Signed)
Message sent to provider 

## 2018-06-29 NOTE — Telephone Encounter (Signed)
Message sent to provider 

## 2018-06-30 NOTE — Telephone Encounter (Signed)
Message sent to provider 

## 2018-07-01 NOTE — Telephone Encounter (Signed)
Message sent to provider 

## 2018-08-07 ENCOUNTER — Inpatient Hospital Stay (HOSPITAL_COMMUNITY)
Admission: AD | Admit: 2018-08-07 | Discharge: 2018-08-11 | DRG: 812 | Disposition: A | Discharge status: Home / Self Care | Payer: Medicaid Other | Source: Other Acute Inpatient Hospital | Attending: Internal Medicine | Admitting: Internal Medicine

## 2018-08-07 ENCOUNTER — Other Ambulatory Visit: Payer: Self-pay

## 2018-08-07 ENCOUNTER — Inpatient Hospital Stay: Payer: Self-pay

## 2018-08-07 DIAGNOSIS — Z9109 Other allergy status, other than to drugs and biological substances: Secondary | ICD-10-CM | POA: Diagnosis not present

## 2018-08-07 DIAGNOSIS — Z888 Allergy status to other drugs, medicaments and biological substances status: Secondary | ICD-10-CM

## 2018-08-07 DIAGNOSIS — D72829 Elevated white blood cell count, unspecified: Secondary | ICD-10-CM | POA: Diagnosis present

## 2018-08-07 DIAGNOSIS — Z1159 Encounter for screening for other viral diseases: Secondary | ICD-10-CM | POA: Diagnosis not present

## 2018-08-07 DIAGNOSIS — Z9049 Acquired absence of other specified parts of digestive tract: Secondary | ICD-10-CM

## 2018-08-07 DIAGNOSIS — G629 Polyneuropathy, unspecified: Secondary | ICD-10-CM

## 2018-08-07 DIAGNOSIS — Z765 Malingerer [conscious simulation]: Secondary | ICD-10-CM | POA: Diagnosis not present

## 2018-08-07 DIAGNOSIS — Z79899 Other long term (current) drug therapy: Secondary | ICD-10-CM

## 2018-08-07 DIAGNOSIS — G894 Chronic pain syndrome: Secondary | ICD-10-CM | POA: Diagnosis present

## 2018-08-07 DIAGNOSIS — M79601 Pain in right arm: Secondary | ICD-10-CM | POA: Diagnosis present

## 2018-08-07 DIAGNOSIS — D571 Sickle-cell disease without crisis: Secondary | ICD-10-CM | POA: Diagnosis not present

## 2018-08-07 DIAGNOSIS — Z793 Long term (current) use of hormonal contraceptives: Secondary | ICD-10-CM

## 2018-08-07 DIAGNOSIS — D638 Anemia in other chronic diseases classified elsewhere: Secondary | ICD-10-CM

## 2018-08-07 DIAGNOSIS — I8290 Acute embolism and thrombosis of unspecified vein: Secondary | ICD-10-CM | POA: Diagnosis not present

## 2018-08-07 DIAGNOSIS — F17210 Nicotine dependence, cigarettes, uncomplicated: Secondary | ICD-10-CM | POA: Diagnosis present

## 2018-08-07 DIAGNOSIS — F339 Major depressive disorder, recurrent, unspecified: Secondary | ICD-10-CM | POA: Diagnosis present

## 2018-08-07 DIAGNOSIS — E876 Hypokalemia: Secondary | ICD-10-CM | POA: Diagnosis present

## 2018-08-07 DIAGNOSIS — Z885 Allergy status to narcotic agent status: Secondary | ICD-10-CM | POA: Diagnosis not present

## 2018-08-07 DIAGNOSIS — R000 Tachycardia, unspecified: Secondary | ICD-10-CM | POA: Diagnosis present

## 2018-08-07 DIAGNOSIS — G43909 Migraine, unspecified, not intractable, without status migrainosus: Secondary | ICD-10-CM | POA: Diagnosis present

## 2018-08-07 DIAGNOSIS — F1120 Opioid dependence, uncomplicated: Secondary | ICD-10-CM | POA: Diagnosis present

## 2018-08-07 DIAGNOSIS — I82611 Acute embolism and thrombosis of superficial veins of right upper extremity: Secondary | ICD-10-CM | POA: Diagnosis present

## 2018-08-07 DIAGNOSIS — Z832 Family history of diseases of the blood and blood-forming organs and certain disorders involving the immune mechanism: Secondary | ICD-10-CM

## 2018-08-07 DIAGNOSIS — D5700 Hb-SS disease with crisis, unspecified: Secondary | ICD-10-CM | POA: Diagnosis present

## 2018-08-07 LAB — COMPREHENSIVE METABOLIC PANEL
ALT: 19 U/L (ref 0–44)
AST: 34 U/L (ref 15–41)
Albumin: 2.9 g/dL — ABNORMAL LOW (ref 3.5–5.0)
Alkaline Phosphatase: 158 U/L — ABNORMAL HIGH (ref 38–126)
Anion gap: 8 (ref 5–15)
BUN: <5 mg/dL — ABNORMAL LOW (ref 6–20)
CO2: 22 mmol/L (ref 22–32)
Calcium: 8.3 mg/dL — ABNORMAL LOW (ref 8.9–10.3)
Chloride: 110 mmol/L (ref 98–111)
Creatinine, Ser: 0.51 mg/dL (ref 0.44–1.00)
GFR calc Af Amer: >60 mL/min (ref >60)
GFR calc non Af Amer: >60 mL/min (ref >60)
Glucose, Bld: 82 mg/dL (ref 70–99)
Potassium: 2.3 mmol/L — CL (ref 3.5–5.1)
Sodium: 140 mmol/L (ref 135–145)
Total Bilirubin: 0.8 mg/dL (ref 0.3–1.2)
Total Protein: 6.3 g/dL — ABNORMAL LOW (ref 6.5–8.1)

## 2018-08-07 LAB — DIFFERENTIAL
Abs Immature Granulocytes: 0.12 10*3/uL — ABNORMAL HIGH (ref 0.00–0.07)
Basophils Absolute: 0 10*3/uL (ref 0.0–0.1)
Basophils Relative: 0 %
Eosinophils Absolute: 0 10*3/uL (ref 0.0–0.5)
Eosinophils Relative: 0 %
Immature Granulocytes: 1 %
Lymphocytes Relative: 24 %
Lymphs Abs: 2.8 10*3/uL (ref 0.7–4.0)
Monocytes Absolute: 0.8 10*3/uL (ref 0.1–1.0)
Monocytes Relative: 6 %
Neutro Abs: 8 10*3/uL — ABNORMAL HIGH (ref 1.7–7.7)
Neutrophils Relative %: 69 %

## 2018-08-07 LAB — PROTIME-INR
INR: 1.2 (ref 0.8–1.2)
Prothrombin Time: 15.5 seconds — ABNORMAL HIGH (ref 11.4–15.2)

## 2018-08-07 LAB — CBC
HCT: 22.2 % — ABNORMAL LOW (ref 36.0–46.0)
Hemoglobin: 6.9 g/dL — CL (ref 12.0–15.0)
MCH: 31.4 pg (ref 26.0–34.0)
MCHC: 31.1 g/dL (ref 30.0–36.0)
MCV: 100.9 fL — ABNORMAL HIGH (ref 80.0–100.0)
Platelets: 429 10*3/uL — ABNORMAL HIGH (ref 150–400)
RBC: 2.2 MIL/uL — ABNORMAL LOW (ref 3.87–5.11)
RDW: 19.8 % — ABNORMAL HIGH (ref 11.5–15.5)
WBC: 11.8 10*3/uL — ABNORMAL HIGH (ref 4.0–10.5)
nRBC: 8 % — ABNORMAL HIGH (ref 0.0–0.2)

## 2018-08-07 LAB — SARS CORONAVIRUS 2 BY RT PCR (HOSPITAL ORDER, PERFORMED IN ~~LOC~~ HOSPITAL LAB): SARS Coronavirus 2: NEGATIVE

## 2018-08-07 LAB — APTT: aPTT: 171 seconds (ref 24–36)

## 2018-08-07 MED ORDER — HYDROMORPHONE 1 MG/ML IV SOLN
INTRAVENOUS | Status: DC
  Administered 2018-08-07: 3.6 mg via INTRAVENOUS
  Administered 2018-08-07: 3 mg via INTRAVENOUS
  Administered 2018-08-07: 5.1 mg via INTRAVENOUS
  Administered 2018-08-07: 30 mg via INTRAVENOUS
  Administered 2018-08-07: 4.7 mg via INTRAVENOUS
  Administered 2018-08-08: 4.8 mg via INTRAVENOUS
  Administered 2018-08-08: 30 mg via INTRAVENOUS
  Administered 2018-08-08: 7.8 mg via INTRAVENOUS
  Filled 2018-08-07 (×2): qty 30

## 2018-08-07 MED ORDER — KETOROLAC TROMETHAMINE 15 MG/ML IJ SOLN
15.0000 mg | Freq: Four times a day (QID) | INTRAMUSCULAR | Status: DC
  Administered 2018-08-07 – 2018-08-11 (×17): 15 mg via INTRAVENOUS
  Filled 2018-08-07 (×18): qty 1

## 2018-08-07 MED ORDER — SENNOSIDES-DOCUSATE SODIUM 8.6-50 MG PO TABS
1.0000 | ORAL_TABLET | Freq: Two times a day (BID) | ORAL | Status: DC
  Administered 2018-08-07: 1 via ORAL
  Filled 2018-08-07 (×5): qty 1

## 2018-08-07 MED ORDER — NALOXONE HCL 0.4 MG/ML IJ SOLN: 0.4000 mg | INTRAMUSCULAR | Status: DC | PRN

## 2018-08-07 MED ORDER — RIVAROXABAN 15 MG PO TABS
15.0000 mg | ORAL_TABLET | Freq: Two times a day (BID) | ORAL | Status: DC
  Administered 2018-08-07 – 2018-08-11 (×8): 15 mg via ORAL
  Filled 2018-08-07 (×8): qty 1

## 2018-08-07 MED ORDER — HEPARIN (PORCINE) 25000 UT/250ML-% IV SOLN
1000.0000 [IU]/h | INTRAVENOUS | Status: DC
  Administered 2018-08-07: 1000 [IU]/h via INTRAVENOUS
  Filled 2018-08-07: qty 250

## 2018-08-07 MED ORDER — POLYETHYLENE GLYCOL 3350 17 G PO PACK: 17.0000 g | PACK | Freq: Every day | ORAL | Status: DC | PRN

## 2018-08-07 MED ORDER — PRO-STAT SUGAR FREE PO LIQD
30.0000 mL | Freq: Every day | ORAL | Status: DC
  Filled 2018-08-07 (×2): qty 30

## 2018-08-07 MED ORDER — POTASSIUM CHLORIDE CRYS ER 20 MEQ PO TBCR
40.0000 meq | EXTENDED_RELEASE_TABLET | Freq: Once | ORAL | Status: AC
  Administered 2018-08-07: 40 meq via ORAL
  Filled 2018-08-07: qty 2

## 2018-08-07 MED ORDER — DIPHENHYDRAMINE HCL 12.5 MG/5ML PO ELIX: 12.5000 mg | ORAL_SOLUTION | Freq: Four times a day (QID) | ORAL | Status: DC | PRN

## 2018-08-07 MED ORDER — RIVAROXABAN 20 MG PO TABS: 20.0000 mg | ORAL_TABLET | Freq: Every day | ORAL | Status: DC

## 2018-08-07 MED ORDER — SODIUM CHLORIDE 0.45 % IV SOLN
INTRAVENOUS | Status: DC
  Administered 2018-08-07 – 2018-08-11 (×8): via INTRAVENOUS

## 2018-08-07 MED ORDER — ENSURE ENLIVE PO LIQD: 237.0000 mL | ORAL | Status: DC

## 2018-08-07 MED ORDER — SODIUM CHLORIDE 0.9% FLUSH: 9.0000 mL | INTRAVENOUS | Status: DC | PRN

## 2018-08-07 MED ORDER — HEPARIN BOLUS VIA INFUSION
3000.0000 [IU] | Freq: Once | INTRAVENOUS | Status: AC
  Administered 2018-08-07: 07:00:00 3000 [IU] via INTRAVENOUS
  Filled 2018-08-07: qty 3000

## 2018-08-07 MED ORDER — RIVAROXABAN (XARELTO) EDUCATION KIT FOR DVT/PE PATIENTS
PACK | Freq: Once | Status: AC
  Administered 2018-08-07: 23:00:00
  Filled 2018-08-07: qty 1

## 2018-08-07 MED ORDER — FOLIC ACID 1 MG PO TABS
1.0000 mg | ORAL_TABLET | Freq: Every day | ORAL | Status: DC
  Administered 2018-08-07 – 2018-08-11 (×5): 1 mg via ORAL
  Filled 2018-08-07 (×5): qty 1

## 2018-08-07 MED ORDER — HYDROXYUREA 500 MG PO CAPS
500.0000 mg | ORAL_CAPSULE | Freq: Two times a day (BID) | ORAL | Status: DC
  Administered 2018-08-07 – 2018-08-11 (×6): 500 mg via ORAL
  Filled 2018-08-07 (×8): qty 1

## 2018-08-07 MED ORDER — DIPHENHYDRAMINE HCL 50 MG/ML IJ SOLN: 12.5000 mg | Freq: Four times a day (QID) | INTRAMUSCULAR | Status: DC | PRN

## 2018-08-07 MED ORDER — ADULT MULTIVITAMIN W/MINERALS CH
1.0000 | ORAL_TABLET | Freq: Every day | ORAL | Status: DC
  Filled 2018-08-07 (×4): qty 1

## 2018-08-07 MED ORDER — BOOST / RESOURCE BREEZE PO LIQD CUSTOM: 1.0000 | ORAL | Status: DC

## 2018-08-07 NOTE — Discharge Instructions (Signed)
Information on my medicine - XARELTO (rivaroxaban)  This medication education was reviewed with me or my healthcare representative as part of my discharge preparation. WHY WAS XARELTO PRESCRIBED FOR YOU? Xarelto was prescribed to treat blood clots that may have been found in the veins of your legs (deep vein thrombosis) or in your lungs (pulmonary embolism) and to reduce the risk of them occurring again.  What do you need to know about Xarelto? The starting dose is one 15 mg tablet taken TWICE daily with food for the FIRST 21 DAYS then after all 15 mg tablets have been taken or on 6/29 the dose is changed to one 20 mg tablet taken ONCE A DAY with your evening meal.  DO NOT stop taking Xarelto without talking to the health care provider who prescribed the medication.  Refill your prescription for 20 mg tablets before you run out.  After discharge, you should have regular check-up appointments with your healthcare provider that is prescribing your Xarelto.  In the future your dose may need to be changed if your kidney function changes by a significant amount.  What do you do if you miss a dose? If you are taking Xarelto TWICE DAILY and you miss a dose, take it as soon as you remember. You may take two 15 mg tablets (total 30 mg) at the same time then resume your regularly scheduled 15 mg twice daily the next day.  If you are taking Xarelto ONCE DAILY and you miss a dose, take it as soon as you remember on the same day then continue your regularly scheduled once daily regimen the next day. Do not take two doses of Xarelto at the same time.   Important Safety Information Xarelto is a blood thinner medicine that can cause bleeding. You should call your healthcare provider right away if you experience any of the following: ? Bleeding from an injury or your nose that does not stop. ? Unusual colored urine (red or dark brown) or unusual colored stools (red or black). ? Unusual bruising for  unknown reasons. ? A serious fall or if you hit your head (even if there is no bleeding).  Some medicines may interact with Xarelto and might increase your risk of bleeding while on Xarelto. To help avoid this, consult your healthcare provider or pharmacist prior to using any new prescription or non-prescription medications, including herbals, vitamins, non-steroidal anti-inflammatory drugs (NSAIDs) and supplements.  This website has more information on Xarelto: [email or web address].[email or web address]

## 2018-08-07 NOTE — Progress Notes (Signed)
Initial Nutrition Assessment  RD working remotely.   DOCUMENTATION CODES:   (unable to assess for malnutrition at this time)  INTERVENTION:  - will order Boost Breeze once/day, each supplement provides 250 kcal and 9 grams of protein. - will order Ensure Enlive once/day, each supplement provides 350 kcal and 20 grams of protein. - will order 30 mL Prostat once/day, each supplement provides 100 kcal and 15 grams of protein. - will order daily multivitamin with minerals. - continue to encourage PO intakes.  - recommend Mg supplementation to aid in K repletion.    NUTRITION DIAGNOSIS:   Inadequate oral intake related to acute illness, poor appetite as evidenced by per patient/family report.  GOAL:   Patient will meet greater than or equal to 90% of their needs  MONITOR:   PO intake, Supplement acceptance, Labs, Weight trends  REASON FOR ASSESSMENT:   Malnutrition Screening Tool  ASSESSMENT:   33 y.o. female with medical history significant of sickle cell anemia, drug-seeking behavior, depression, and migraines. She was [facility] for management of sickle cell pain crisis. Patient reported 3-day history of severe pain in her entire right arm and entire right leg. She has not noticed any swelling in these areas. Stated she continues to have 8/10 intensity pain despite receiving doses of pain medications at the outside hospital. She reported that she was previously referred to the sickle cell center at [facility] but has not followed up with them.  Her plan is to go to a sickle cell center in [place].  No intakes documented since admission. Unable to contact patient via phone this AM. Per chart review, current weight is 117 lb. Weight on 6/1 was recorded as 126 lb which would indicate 9 lb weight loss (7% body weight) in the past 1 week. Weight recorded as 130 lb on 4/24 and 129 lb on 3/24. All weights prior to current weight of 117 were found  in review of Care Everywhere. Question if weight loss is partly due to fluid loss given current IV fluid rate; will continue to monitor.   Per notes: sickle cell pain crisis with reported ongoing severe pain despite IV pain medication, venous thromboembolism 2/2 sickle cell disease.   Medications reviewed; 1 mg folvite/day, 40 mEq K-Dur x1 dose 6/7, 1 tablet senokot BID.  Labs reviewed; K: 2.3 mmol/l, BUN: <5 mg/dl, Ca: 8.3 mg/dl, Alk Phos elevated.  IVF; 1/2 NS @ 100 ml/hr.     NUTRITION - FOCUSED PHYSICAL EXAM:  unable to complete at this time.   Diet Order:   Diet Order            Diet regular Room service appropriate? Yes; Fluid consistency: Thin  Diet effective now              EDUCATION NEEDS:   No education needs have been identified at this time  Skin:  Skin Assessment: Reviewed RN Assessment  Last BM:  6/6  Height:   Ht Readings from Last 1 Encounters:  08/07/18 5' (1.524 m)    Weight:   Wt Readings from Last 1 Encounters:  08/07/18 53.2 kg    Ideal Body Weight:  45.4 kg  BMI:  Body mass index is 22.91 kg/m.  Estimated Nutritional Needs:   Kcal:  1600-1860 kcal  Protein:  65-75 grams  Fluid:  >/= 1.8 L/day     Jarome Matin, MS, RD, LDN, [facility] Inpatient Clinical Dietitian Pager # [phone] After hours/weekend pager # [phone]

## 2018-08-07 NOTE — Progress Notes (Signed)
Read H&P from Dr. Marlowe Sax. Brittany Lesch, RN patient should have a restricted extremity armband on her right arm. RN will follow-up.

## 2018-08-07 NOTE — Progress Notes (Signed)
R. Holmes from lab called and reported crital lab with K+ level- 2.3. Dr. Jonelle Sidle notified with new order given. Please see orders.

## 2018-08-07 NOTE — Progress Notes (Addendum)
Orders received for PICC placement after failed midline attempt. Patient is admitted for DVT to RUE; RUE restricted. Patient has one IV, on heparin, and getting PCA analgesia. Best practice is to have a separate line for heparin. With ultrasound, the left arm was assessed by two PICC RNs after failed attempt from [place]. Upon assessment, cephalic and basilic veins not viewed and brachial veins too small to occupy catheter safely and an artery positioned over them. Midline or PICC placement would be too high a risk for arterial cannulation. Information passed on to nurse, who will follow up with primary. Upon further investigation, patient has had central lines placed by interventional radiology. FYI added to reflect this. Order for PICC to be canceled.

## 2018-08-07 NOTE — Progress Notes (Signed)
Pink restricted bracelet applied to right arm at 12:47 per report from [employer]. Return call received from Dr. Jonelle Sidle and MD will follow up. Order given for PICC placement and PICC team made aware. Patient refused all medication and cried  Stating she feels like this Probation officer is not working for her because extra pain medications haven't been ordered. Tried to console patient and explained that the nurse is the advocate for the patient and hard work would continue to go forth on her behalf. Patient agreed to allow the PICC team to come and place PICC line. Explanation of insertion process given.

## 2018-08-07 NOTE — Progress Notes (Signed)
PTT 171 and hgb 6.9 with results called to Dr. Jonelle Sidle. No new orders. Will continue to monitor.

## 2018-08-07 NOTE — Plan of Care (Signed)
Easily agitated and angry today. Refused to take any PO medications and non compliant with me and other disciplines. Refused IV to be started and refused to have blood drawn.

## 2018-08-07 NOTE — Progress Notes (Signed)
Patient waiting for PICC placement and refused to have blood drawn for Heparin management. Monitoring continued.

## 2018-08-07 NOTE — H&P (Signed)
History and Physical    Brittany Foster WFU:[id number] DOB: [birth year] DOA: 08/07/2018  PCP: No primary care provider on file. [facility]  Chief Complaint: Sickle cell pain  HPI: Brittany Foster is a 33 y.o. female with medical history significant of sickle cell anemia, drug-seeking behavior, depression, migraines presenting as a transfer [facility] [place] [facility] for the management of sickle cell pain crisis.  Patient reports 3-day history of severe pain in her entire right arm and entire right leg.  She has not noticed any swelling in these areas.  Denies any history of blood clots.  States she continues to have 8 out of 10 intensity pain despite receiving doses of pain medications at the outside hospital.  Denies any chest pain, shortness of breath, cough, or abdominal pain.  States she was previously referred to the sickle cell center at [facility] but has not followed up with them.  Her plan is to go to a sickle cell center in [facility].  At outside hospital ED:   Temperature 100 F, pulse 90, respiratory rate 16, blood pressure 112/66, SPO2 99% on room air.  White count 10.1.  Hemoglobin 8.9, at baseline.  Reticulocyte count 6.4%  AST 65, ALT 33, alk phos 181, total bilirubin 1.7.  Per hospital records, bilirubin is chronically in the 3 range.  UDS negative.  UA not suggestive of infection.  EKG: Sinus tachycardia (heart rate 105).  No acute ischemic changes.  Chest x-ray: No acute pulmonary disease.  Right upper extremity ultrasound: No evidence of thrombus within the proximal right internal jugular vein through the right brachial vein.  Occlusive thrombus within the right cephalic vein.  Unremarkable appearance of the basilic vein.  Right lower extremity ultrasound: Occlusive thrombus within 1 of 2 paired peroneal veins.  She was given normal saline bolus, fentanyl, Benadryl, and IV heparin.  Review of Systems:  All systems reviewed  and apart from history of presenting illness, are negative.  Past Medical History:  Diagnosis Date  . Anemia   . Depression, major, recurrent ([place])   . Migraines   . Sickle cell anemia (HCC)     Past Surgical History:  Procedure Laterality Date  . CESAREAN SECTION    . CHOLECYSTECTOMY  2000  . IR GENERIC HISTORICAL  10/08/2015   IR US GUIDE VASC ACCESS RIGHT 10/08/2015 Sandi Mariscal, MD [facility]-INTER[facility]  . IR GENERIC HISTORICAL  10/08/2015   IR FLUORO GUIDE CV LINE RIGHT 10/08/2015 Sandi Mariscal, MD [facility]  . IR REMOVAL TUN CV CATH W/O FL  03/09/2017  . MULTIPLE TOOTH EXTRACTIONS N/A   . port a cath placement Right    about 6-7 years ago  . removal of porta cath Right 09/11/15  . TUBAL LIGATION       reports that she has been smoking. She has been smoking about 1.00 pack per day. She uses smokeless tobacco. She reports that she does not drink alcohol or use drugs.  Allergies  Allergen Reactions  . Ultram [Tramadol] Other (See Comments)    Reaction:  Seizures   . Zofran [Ondansetron Hcl] Nausea And Vomiting  . Buprenorphine Hcl Hives and Other (See Comments)    Reaction:  Shaking   . Fentanyl Hives  . Morphine And Related Hives and Other (See Comments)    Reaction:  Shaking   . Tape Rash    Family History  Problem Relation Age of Onset  . Sickle cell trait Father   . Sickle  cell trait Mother   . Sickle cell anemia Other     Prior to Admission medications   Medication Sig Start Date End Date Taking? Authorizing Provider  ARIPiprazole (ABILIFY) 10 MG tablet Take 10 mg by mouth daily 02/13/16   [provider]  DULoxetine (CYMBALTA) 60 MG capsule TAKE 1 CAPSULE BY MOUTH DAILY. 11/27/15   Tresa Garter, MD  folic acid (FOLVITE) 1 MG tablet Take 1 tablet (1 mg total) by mouth daily. 09/11/16   Scot Jun, FNP  hydroxyurea (HYDREA) 500 MG capsule TAKE 2 CAPSULES BY MOUTH DAILY. MAY TAKE WITH FOOD TO MINIMIZE GI SIDE EFFECTS. Patient taking differently: Take  1,500 mg by mouth daily.  05/12/16   Tresa Garter, MD  ibuprofen (ADVIL,MOTRIN) 200 MG tablet Take 400 mg by mouth every 6 (six) hours as needed for fever, headache, mild pain or moderate pain.     [provider]  lactulose (CEPHULAC) 20 g packet Take 1 packet (20 g total) by mouth 3 (three) times daily as needed. Patient taking differently: Take 20 g by mouth 3 (three) times daily as needed (constipation).  01/27/17   Scot Jun, FNP  medroxyPROGESTERone (DEPO-PROVERA) 150 MG/ML injection Inject 150 mg into the muscle every 3 (three) months.     [provider]  methocarbamol (ROBAXIN) 500 MG tablet Take 1 tablet (500 mg total) by mouth 4 (four) times daily. 04/09/17   Scot Jun, FNP  oxyCODONE-acetaminophen (PERCOCET) 10-325 MG tablet Take 1 tablet by mouth every 4 (four) hours as needed for pain. 03/11/17   Leana Gamer, MD  potassium chloride SA (K-DUR,KLOR-CON) 20 MEQ tablet Take 2 tablets (40 mEq total) by mouth daily. 02/24/17   Leana Gamer, MD  Topiramate ER 100 MG CP24 Take 200 mg by mouth at bedtime. Patient taking differently: Take 100 mg by mouth at bedtime.  09/11/16   Scot Jun, FNP  traZODone (DESYREL) 50 MG tablet Take 0.5-1 tablets (25-50 mg total) by mouth at bedtime as needed for sleep. 01/27/17   Scot Jun, FNP    Physical Exam: Vitals:   08/07/18 0315 08/07/18 0432  BP: 112/74   Pulse: 86   Resp: 16   Temp: 98.2 F (36.8 C)   TempSrc: Oral   SpO2: 100%   Weight:  53.2 kg    Physical Exam  Constitutional: She is oriented to person, place, and time. She appears well-developed and well-nourished. No distress.  HENT:  Head: Normocephalic.  Mouth/Throat: Oropharynx is clear and moist.  Eyes: Right eye exhibits no discharge. Left eye exhibits no discharge.  Neck: Neck supple.  Cardiovascular: Normal rate, regular rhythm and intact distal pulses.  Pulmonary/Chest: Effort normal and breath sounds  normal. No respiratory distress. She has no wheezes. She has no rales.  Abdominal: Soft. Bowel sounds are normal. She exhibits no distension. There is no abdominal tenderness. There is no guarding.  Musculoskeletal:        General: No edema.     Comments: No right upper extremity or right lower extremity edema noted.  Neurological: She is alert and oriented to person, place, and time.  Skin: Skin is warm and dry. She is not diaphoretic.     Labs on Admission: I have personally reviewed following labs and imaging studies  CBC: No results for input(s): WBC, NEUTROABS, HGB, HCT, MCV, PLT in the last 168 hours. Basic Metabolic Panel: No results for input(s): NA, K, CL, CO2, GLUCOSE, BUN, CREATININE,  CALCIUM, MG, PHOS in the last 168 hours. GFR: CrCl cannot be calculated (Patient's most recent lab result is older than the maximum 21 days allowed.). Liver Function Tests: No results for input(s): AST, ALT, ALKPHOS, BILITOT, PROT, ALBUMIN in the last 168 hours. No results for input(s): LIPASE, AMYLASE in the last 168 hours. No results for input(s): AMMONIA in the last 168 hours. Coagulation Profile: No results for input(s): INR, PROTIME in the last 168 hours. Cardiac Enzymes: No results for input(s): CKTOTAL, CKMB, CKMBINDEX, TROPONINI in the last 168 hours. BNP (last 3 results) No results for input(s): PROBNP in the last 8760 hours. HbA1C: No results for input(s): HGBA1C in the last 72 hours. CBG: No results for input(s): GLUCAP in the last 168 hours. Lipid Profile: No results for input(s): CHOL, HDL, LDLCALC, TRIG, CHOLHDL, LDLDIRECT in the last 72 hours. Thyroid Function Tests: No results for input(s): TSH, T4TOTAL, FREET4, T3FREE, THYROIDAB in the last 72 hours. Anemia Panel: No results for input(s): VITAMINB12, FOLATE, FERRITIN, TIBC, IRON, RETICCTPCT in the last 72 hours. Urine analysis:    Component Value Date/Time   COLORURINE AMBER (A) 02/17/2017 1700   APPEARANCEUR CLEAR  02/17/2017 1700   LABSPEC 1.016 02/17/2017 1700   PHURINE 5.0 02/17/2017 1700   GLUCOSEU NEGATIVE 02/17/2017 1700   HGBUR NEGATIVE 02/17/2017 1700   BILIRUBINUR SMALL (A) 02/17/2017 1700   KETONESUR NEGATIVE 02/17/2017 1700   PROTEINUR 30 (A) 02/17/2017 1700   UROBILINOGEN 1.0 01/27/2017 1054   NITRITE NEGATIVE 02/17/2017 1700   LEUKOCYTESUR SMALL (A) 02/17/2017 1700    Radiological Exams on Admission: No results found.  Assessment/Plan Principal Problem:   Sickle cell pain crisis ([place]) Active Problems:   Venous thromboembolism   Sickle cell pain crisis Afebrile and hemodynamically stable here.  Labs done at outside hospital showing hemoglobin 8.9, at baseline.  Reticulocyte count 6.4%.  Chest x-ray done at outside hospital showing no acute pulmonary disease.  Patient continues to complain of 8 out of 10 intensity pain in her right upper extremity and right lower extremity despite receiving pain medications at outside hospital. -Dilaudid PCA -Continuous pulse ox -0.45% normal saline at 100 cc/h -Toradol 15 mg every 6 hours -Continue folic acid -Continue hydroxyurea -Continue to monitor labs  Venous thromboembolism secondary to sickle cell disease Ultrasound done at the outside hospital showing occlusive thrombus within the right cephalic vein and occlusive thrombus within 1 of 2 paired peroneal veins. -IV heparin -Pain management as above  Unable to safely order home medications at this time as pharmacy medication reconciliation is pending.  DVT prophylaxis: Heparin Code Status: Full code Family Communication: No family available. Disposition Plan: Anticipate discharge after clinical improvement. Consults called: None Admission status: It is my clinical opinion that admission to INPATIENT is reasonable and necessary in this 33 y.o. female . presenting with symptoms of severe right upper extremity and right lower extremity pain, concerning for VTE secondary to sickle cell  anemia, sickle cell pain crisis . in the context of PMH including: Sickle cell anemia . and pertinent positives on radiographic and laboratory data including: Ultrasound with evidence of VTE . Workup and treatment include Dilaudid PCA for pain management and IV heparin for anticoagulation.  Given the aforementioned, the predictability of an adverse outcome is felt to be significant. I expect that the patient will require at least 2 midnights in the hospital to treat this condition.   The medical decision making on this patient was of high complexity and the patient is at high  risk for clinical deterioration, therefore this is a level 3 visit.  Shela Leff MD [facility] Pager [phone]  If 7PM-7AM, please contact night-coverage [email or web address] Password [email or web address]  08/07/2018, 4:59 AM

## 2018-08-07 NOTE — Progress Notes (Signed)
ANTICOAGULATION CONSULT NOTE - Follow Up Consult  Pharmacy Consult for Heparin>>xarelto Indication: DVT  Allergies  Allergen Reactions  . Ultram [Tramadol] Other (See Comments)    Reaction:  Seizures   . Zofran [Ondansetron Hcl] Nausea And Vomiting  . Buprenorphine Hcl Hives and Other (See Comments)    Reaction:  Shaking   . Fentanyl Hives  . Morphine And Related Hives and Other (See Comments)    Reaction:  Shaking   . Tape Rash    Patient Measurements: Height: 5' (152.4 cm) Weight: 117 lb 4.6 oz (53.2 kg) IBW/kg (Calculated) : 45.5 Heparin Dosing Weight:   Vital Signs: Temp: 97.9 F (36.6 C) (06/07 1410) Temp Source: Oral (06/07 1410) BP: 104/68 (06/07 1410) Pulse Rate: 74 (06/07 1410)  Labs: Recent Labs    08/07/18 0523 08/07/18 0534  HGB 6.9*  --   HCT 22.2*  --   PLT 429*  --   APTT  --  171*  LABPROT  --  15.5*  INR  --  1.2  CREATININE 0.51  --     Estimated Creatinine Clearance: 71.2 mL/min (by C-G formula based on SCr of 0.51 mg/dL).   Medications:  Infusions:  . sodium chloride 100 mL/hr at 08/07/18 1534    Assessment: 33 yo F with new occlusive thrombus within the right cephalic vein and occlusive thrombus within 1 of 2 paired peroneal veins.  Baseline coags ordered.  No oral anticoagulants noted on med rec.  08/07/2018 Pt refusing all heparin levels for heparin. Pharmacy consulted to transition from heparin to [place].  Pt on hydrea for Stem but refused dose today. Baseline aPTT elevated at 171 - ? If drawn after heparin started - does not look like it from charting in [place]. Baseline INR 1.2.  Hg 6.9 low but acceptable for [place] pt.  PLC 429. No bleeding reported.     Plan:  Dc heparin drip xarelto 15 mg po BID x 21 days then xarelto 20 mg qsupper xarelto discharge education kit ordered for pt AVS info added Will educate prior to discharge  Eudelia Bunch, Pharm.D [phone] 08/07/2018 4:24 PM

## 2018-08-07 NOTE — Progress Notes (Signed)
Patient requesting to speak with her physician. Text page sent to Dr. Jonelle Sidle, awaiting response.

## 2018-08-07 NOTE — Progress Notes (Signed)
ANTICOAGULATION CONSULT NOTE - Follow Up Consult  Pharmacy Consult for Heparin Indication: DVT  Allergies  Allergen Reactions  . Ultram [Tramadol] Other (See Comments)    Reaction:  Seizures   . Zofran [Ondansetron Hcl] Nausea And Vomiting  . Buprenorphine Hcl Hives and Other (See Comments)    Reaction:  Shaking   . Fentanyl Hives  . Morphine And Related Hives and Other (See Comments)    Reaction:  Shaking   . Tape Rash    Patient Measurements: Height: 5' (152.4 cm) Weight: 117 lb 4.6 oz (53.2 kg) IBW/kg (Calculated) : 45.5 Heparin Dosing Weight:   Vital Signs: Temp: 98.2 F (36.8 C) (06/07 0315) Temp Source: Oral (06/07 0315) BP: 112/74 (06/07 0315) Pulse Rate: 86 (06/07 0315)  Labs: No results for input(s): HGB, HCT, PLT, APTT, LABPROT, INR, HEPARINUNFRC, HEPRLOWMOCWT, CREATININE, CKTOTAL, CKMB, TROPONINI in the last 72 hours.  CrCl cannot be calculated (Patient's most recent lab result is older than the maximum 21 days allowed.).   Medications:  Infusions:  . sodium chloride    . heparin      Assessment: Patient with new occlusive thrombus within the right cephalic vein and occlusive thrombus within 1 of 2 paired peroneal veins.  Baseline coags ordered.  No oral anticoagulants noted on med rec.   Goal of Therapy:  Heparin level 0.3-0.7 units/ml Monitor platelets by anticoagulation protocol: Yes   Plan:  Heparin bolus  3000 units iv x1 Heparin drip at  1000 units/hr Daily CBC Next heparin level at [place], Sabien Umland [place] 08/07/2018,5:11 AM

## 2018-08-08 DIAGNOSIS — I8290 Acute embolism and thrombosis of unspecified vein: Secondary | ICD-10-CM

## 2018-08-08 DIAGNOSIS — D5700 Hb-SS disease with crisis, unspecified: Principal | ICD-10-CM

## 2018-08-08 LAB — HIV ANTIBODY (ROUTINE TESTING W REFLEX): HIV Screen 4th Generation wRfx: NONREACTIVE

## 2018-08-08 MED ORDER — NALOXONE HCL 0.4 MG/ML IJ SOLN: 0.4000 mg | INTRAMUSCULAR | Status: DC | PRN

## 2018-08-08 MED ORDER — HYDROMORPHONE 1 MG/ML IV SOLN
INTRAVENOUS | Status: DC
  Administered 2018-08-08: 7.5 mg via INTRAVENOUS
  Administered 2018-08-08: 30 mg via INTRAVENOUS
  Administered 2018-08-08 – 2018-08-09 (×2): 4.5 mg via INTRAVENOUS
  Filled 2018-08-08: qty 30

## 2018-08-08 MED ORDER — ONDANSETRON HCL 4 MG/2ML IJ SOLN: 4.0000 mg | Freq: Four times a day (QID) | INTRAMUSCULAR | Status: DC | PRN

## 2018-08-08 MED ORDER — DIPHENHYDRAMINE HCL 25 MG PO CAPS: 25.0000 mg | ORAL_CAPSULE | ORAL | Status: DC | PRN

## 2018-08-08 MED ORDER — SODIUM CHLORIDE 0.9% FLUSH: 9.0000 mL | INTRAVENOUS | Status: DC | PRN

## 2018-08-08 NOTE — Progress Notes (Addendum)
Subjective: Brittany Foster, a 33 year old female with a medical history significant for sickle cell disease, chronic pain syndrome, opiate dependence, history of drug-seeking behavior, major depressive disorder in remission, and history of migraine headaches was admitted for DVT and right upper and right lower extremity and sickle cell pain crisis. [facility] [place].  Right upper extremity ultrasound showed occlusive thrombus within the right cephalic vein and right lower extremity ultrasound showed occlusive thrombus within 1 of 2 paired peroneal veins.  Patient was initially treated with heparin drip, however she is transition to Xarelto due to poor venous access.  Patient endorses excruciating pain to the right and lower extremities.  Patient states that pain has been unrelieved by current pain medication regimen.  Pain intensity 9/10 characterized as constant and sharp.  Patient very tearful on arrival and writhing in pain.  She denies headache, dizziness, shortness of breath, nausea, vomiting, or diarrhea. Objective:  Vital signs in last 24 hours:  Vitals:   08/08/18 0742 08/08/18 0918 08/08/18 1208 08/08/18 1255  BP: 99/63  104/65   Pulse: 72  81   Resp:  12  12  Temp: 97.9 F (36.6 C)  98.9 F (37.2 C)   TempSrc: Oral  Oral   SpO2: 98% 100% 99% 100%  Weight:      Height:        Intake/Output from previous day:   Intake/Output Summary (Last 24 hours) at 08/08/2018 1400 Last data filed at 08/07/2018 1851 Gross per 24 hour  Intake -  Output 750 ml  Net -750 ml   Physical Exam Constitutional:      General: She is in acute distress.  HENT:     Head: Normocephalic.     Mouth/Throat:     Mouth: Mucous membranes are moist.  Eyes:     Pupils: Pupils are equal, round, and reactive to light.  Neck:     Musculoskeletal: Normal range of motion.  Cardiovascular:     Rate and Rhythm: Normal rate and regular rhythm.      Pulses: Normal pulses.  Pulmonary:     Effort: Pulmonary effort is normal.     Breath sounds: Normal breath sounds.  Abdominal:     General: Abdomen is flat. Bowel sounds are normal.  Skin:    General: Skin is warm and dry.  Neurological:     General: No focal deficit present.     Mental Status: She is alert. Mental status is at baseline.  Psychiatric:        Mood and Affect: Mood normal. Affect is tearful.        Behavior: Behavior normal.        Thought Content: Thought content normal.        Judgment: Judgment normal.      Lab Results:  Basic Metabolic Panel:    Component Value Date/Time   NA 140 08/07/2018 0523   K 2.3 (LL) 08/07/2018 0523   CL 110 08/07/2018 0523   CO2 22 08/07/2018 0523   BUN <5 (L) 08/07/2018 0523   CREATININE 0.51 08/07/2018 0523   GLUCOSE 82 08/07/2018 0523   CALCIUM 8.3 (L) 08/07/2018 0523   CBC:    Component Value Date/Time   WBC 11.8 (H) 08/07/2018 0523   HGB 6.9 (LL) 08/07/2018 0523   HCT 22.2 (L) 08/07/2018 0523   PLT 429 (H) 08/07/2018 0523   MCV 100.9 (H) 08/07/2018 0523   NEUTROABS 8.0 (H) 08/07/2018 [id number]  LYMPHSABS 2.8 08/07/2018 0523   MONOABS 0.8 08/07/2018 0523   EOSABS 0.0 08/07/2018 0523   BASOSABS 0.0 08/07/2018 0523    Recent Results (from the past 240 hour(s))  SARS Coronavirus 2 (CEPHEID - Performed in [facility] hospital lab), Hosp Order     Status: None   Collection Time: 08/07/18  4:09 AM  Result Value Ref Range Status   SARS Coronavirus 2 NEGATIVE NEGATIVE Final    Comment: (NOTE) If result is NEGATIVE SARS-CoV-2 target nucleic acids are NOT DETECTED. The SARS-CoV-2 RNA is generally detectable in upper and lower  respiratory specimens during the acute phase of infection. The lowest  concentration of SARS-CoV-2 viral copies this assay can detect is 250  copies / mL. A negative result does not preclude SARS-CoV-2 infection  and should not be used as the sole basis for treatment or other  patient  management decisions.  A negative result may occur with  improper specimen collection / handling, submission of specimen other  than nasopharyngeal swab, presence of viral mutation(s) within the  areas targeted by this assay, and inadequate number of viral copies  (<250 copies / mL). A negative result must be combined with clinical  observations, patient history, and epidemiological information. If result is POSITIVE SARS-CoV-2 target nucleic acids are DETECTED. The SARS-CoV-2 RNA is generally detectable in upper and lower  respiratory specimens dur ing the acute phase of infection.  Positive  results are indicative of active infection with SARS-CoV-2.  Clinical  correlation with patient history and other diagnostic information is  necessary to determine patient infection status.  Positive results do  not rule out bacterial infection or co-infection with other viruses. If result is PRESUMPTIVE POSTIVE SARS-CoV-2 nucleic acids MAY BE PRESENT.   A presumptive positive result was obtained on the submitted specimen  and confirmed on repeat testing.  While 2019 novel coronavirus  (SARS-CoV-2) nucleic acids may be present in the submitted sample  additional confirmatory testing may be necessary for epidemiological  and / or clinical management purposes  to differentiate between  SARS-CoV-2 and other Sarbecovirus currently known to infect humans.  If clinically indicated additional testing with an alternate test  methodology [phone]([id number]) is advised. The SARS-CoV-2 RNA is generally  detectable in upper and lower respiratory sp ecimens during the acute  phase of infection. The expected result is Negative. Fact Sheet for Patients:  BoilerBrush.com.cyhttps://www.fda.gov/media/136312/download Fact Sheet for Healthcare Providers: https://pope.com/https://www.fda.gov/media/136313/download This test is not yet approved or cleared by the [place] FDA and has been authorized for detection and/or diagnosis of SARS-CoV-2 by FDA under  an Emergency Use Authorization (EUA).  This EUA will remain in effect (meaning this test can be used) for the duration of the COVID-19 declaration under Section 564(b)(1) of the Act, 21 U.S.C. section 360bbb-3(b)(1), unless the authorization is terminated or revoked sooner. Performed at [facility], [address] [place], [place] [address]     Studies/Results: [facility]  Result Date: 08/07/2018 If [facility] image not attached, placement could not be confirmed due to current cardiac rhythm.   Medications: Scheduled Meds: . feeding supplement  1 Container Oral Q24H  . feeding supplement (ENSURE ENLIVE)  237 mL Oral Q24H  . feeding supplement (PRO-STAT SUGAR FREE 64)  30 mL Oral Daily  . folic acid  1 mg Oral Daily  . HYDROmorphone   Intravenous Q4H  . hydroxyurea  500 mg Oral BID  . ketorolac  15 mg Intravenous Q6H  . multivitamin  with minerals  1 tablet Oral Daily  . Rivaroxaban  15 mg Oral BID WC  . [START ON 08/28/2018] rivaroxaban  20 mg Oral Q supper  . senna-docusate  1 tablet Oral BID   Continuous Infusions: . sodium chloride 100 mL/hr at 08/08/18 1301   PRN Meds:.diphenhydrAMINE, naloxone **AND** sodium chloride flush, ondansetron (ZOFRAN) IV, polyethylene glycol  Assessment/Plan: Principal Problem:   Sickle cell pain crisis ([place]) Active Problems:   Anemia of chronic disease   Chronic pain syndrome   Opiate dependence (HCC)   Venous thromboembolism  Sickle cell disease with pain crisis: Patient highly opiate tolerant, but was on full dose PCA at 1.25 mg of Dilaudid per hour.  Change to opiate tolerant, weight-based PCA.  Settings of 0.5 mg, 10-minute lockout, and 3 mg/h with 1 mg loading dose. Toradol 15 mg IV every 6 hours Maintain oxygen saturation above 90%, 2 L supplemental oxygen as needed Continue IV fluids, 0.45% saline at 100 mL/h  Venous thromboembolism secondary to sickle cell disease, Ultrasound performed at [facility] shows occlusive thrombus within the right cephalic vein and occlusive thrombus within 1 of 2 paired peroneal veins.  Continue Xarelto per pharmacy consult Continue pain management  Sickle cell anemia: Hemoglobin 6.9, which is below patient's baseline of 7.0-8.0 g/dL.  Patient does not meet threshold for blood transfusion at this time.  Continue to monitor closely. Continue folic acid and hydroxyurea Repeat CBC in a.m.  Leukocytosis: WBCs 11.8, patient afebrile, no signs of inflammation or infection.  Continue to monitor closely Repeat CBC in a.m.  Chronic pain syndrome: Patient is followed by [facility] pain management.  Patient is highly opiate tolerant.  She typically takes Percocet 10-325 mg every 4 hours for moderate to severe breakthrough pain.  Hold Percocet, use PCA Dilaudid as substitute.   Code Status: Full Code Family Communication: N/A Disposition Plan: Not yet ready for discharge  [place], MSN, FNP-C Patient [facility] [address] [place], [place] [address] [phone]  If 5PM-7AM, please contact night-coverage.  08/08/2018, 2:00 PM  LOS: 1 day

## 2018-08-08 NOTE — Progress Notes (Signed)
24 Hour PCA Dilaudid  Total 4.8 mg Requested 18 Given 16

## 2018-08-08 NOTE — Plan of Care (Signed)

## 2018-08-08 NOTE — Progress Notes (Signed)
This shift pt c/o pain not being managed, Engineer, production to text page oncall MD for bolus. Page sent to hospitalist no new orders given. Pt has made several remarks this shift to other staff about leaving AMA, information not addressed with Probation officer.

## 2018-08-09 DIAGNOSIS — D638 Anemia in other chronic diseases classified elsewhere: Secondary | ICD-10-CM

## 2018-08-09 DIAGNOSIS — F1120 Opioid dependence, uncomplicated: Secondary | ICD-10-CM

## 2018-08-09 DIAGNOSIS — G894 Chronic pain syndrome: Secondary | ICD-10-CM

## 2018-08-09 LAB — BASIC METABOLIC PANEL
Anion gap: 6 (ref 5–15)
BUN: 7 mg/dL (ref 6–20)
CO2: 24 mmol/L (ref 22–32)
Calcium: 8.5 mg/dL — ABNORMAL LOW (ref 8.9–10.3)
Chloride: 107 mmol/L (ref 98–111)
Creatinine, Ser: 0.44 mg/dL (ref 0.44–1.00)
GFR calc Af Amer: >60 mL/min (ref >60)
GFR calc non Af Amer: >60 mL/min (ref >60)
Glucose, Bld: 105 mg/dL — ABNORMAL HIGH (ref 70–99)
Potassium: 2.5 mmol/L — CL (ref 3.5–5.1)
Sodium: 137 mmol/L (ref 135–145)

## 2018-08-09 LAB — CBC
HCT: 21.5 % — ABNORMAL LOW (ref 36.0–46.0)
Hemoglobin: 6.9 g/dL — CL (ref 12.0–15.0)
MCH: 32.7 pg (ref 26.0–34.0)
MCHC: 32.1 g/dL (ref 30.0–36.0)
MCV: 101.9 fL — ABNORMAL HIGH (ref 80.0–100.0)
Platelets: 425 10*3/uL — ABNORMAL HIGH (ref 150–400)
RBC: 2.11 MIL/uL — ABNORMAL LOW (ref 3.87–5.11)
RDW: 19.8 % — ABNORMAL HIGH (ref 11.5–15.5)
WBC: 9.8 10*3/uL (ref 4.0–10.5)
nRBC: 3.9 % — ABNORMAL HIGH (ref 0.0–0.2)

## 2018-08-09 LAB — MAGNESIUM: Magnesium: 1.8 mg/dL (ref 1.7–2.4)

## 2018-08-09 MED ORDER — OXYCODONE HCL 5 MG PO TABS
10.0000 mg | ORAL_TABLET | ORAL | Status: DC | PRN
  Administered 2018-08-09 (×3): 10 mg via ORAL
  Filled 2018-08-09 (×3): qty 2

## 2018-08-09 MED ORDER — POTASSIUM CHLORIDE 10 MEQ/100ML IV SOLN
10.0000 meq | INTRAVENOUS | Status: AC
  Administered 2018-08-09 (×3): 10 meq via INTRAVENOUS
  Filled 2018-08-09 (×3): qty 100

## 2018-08-09 MED ORDER — HYDROMORPHONE 1 MG/ML IV SOLN
INTRAVENOUS | Status: DC
  Administered 2018-08-09: 7.2 mg via INTRAVENOUS
  Administered 2018-08-09: 2.7 mg via INTRAVENOUS
  Administered 2018-08-09: 7.3 mg via INTRAVENOUS
  Administered 2018-08-10 (×2): 2.1 mg via INTRAVENOUS
  Administered 2018-08-10: 3.3 mg via INTRAVENOUS
  Administered 2018-08-10: 6.3 mg via INTRAVENOUS
  Filled 2018-08-09: qty 30

## 2018-08-09 NOTE — Progress Notes (Signed)
24 Hour PCA Pump Total 4.5 mg Requested 9 doses Given 8 doses

## 2018-08-09 NOTE — Progress Notes (Signed)
Subjective: Brittany Foster, a 33 year old female with a medical history significant for sickle cell disease, chronic pain syndrome, opiate dependence, history of drug-seeking behavior, MDD in remission, history of migraine headaches was admitted for DVT to right upper and lower extremity and sickle cell pain crisis.  Patient continues to complain of pain to right upper and lower extremities.  She states that pain intensity improved minimally overnight.  She characterizes pain as 8/10, constant, and sharp.  Patient has not attempted to ambulate in halls.  Patient afebrile and maintaining oxygen saturation at 98% on RA  Patient denies headache, dizziness, shortness of breath, nausea, vomiting, or diarrhea.  Objective:  Vital signs in last 24 hours:  Vitals:   08/09/18 0402 08/09/18 0446 08/09/18 0713 08/09/18 0741  BP:    (!) 91/50  Pulse:    87  Resp: 11 17 16 16   Temp:    98.2 F (36.8 C)  TempSrc:      SpO2: 98%   95%  Weight:      Height:        Intake/Output from previous day:   Intake/Output Summary (Last 24 hours) at 08/09/2018 0857 Last data filed at 08/09/2018 0800 Gross per 24 hour  Intake 4576.35 ml  Output -  Net 4576.35 ml    Physical Exam: General: Alert, awake, oriented x3, in no acute distress.  HEENT: [place]/AT PEERL, EOMI Neck: Trachea midline,  no masses, no thyromegal,y no JVD, no carotid bruit OROPHARYNX:  Moist, No exudate/ erythema/lesions.  Heart: Regular rate and rhythm, without murmurs, rubs, gallops, PMI non-displaced, no heaves or thrills on palpation.  Lungs: Clear to auscultation, no wheezing or rhonchi noted. No increased vocal fremitus resonant to percussion  Abdomen: Soft, nontender, nondistended, positive bowel sounds, no masses no hepatosplenomegaly noted..  Neuro: No focal neurological deficits noted cranial nerves II through XII grossly intact. DTRs 2+ bilaterally upper and lower extremities. Strength 5 out of 5 in bilateral upper and lower  extremities. Musculoskeletal: No warm swelling or erythema around joints, no spinal tenderness noted. Psychiatric: Patient alert and oriented x3, good insight and cognition, good recent to remote recall. Lymph node survey: No cervical axillary or inguinal lymphadenopathy noted.  Lab Results:  Basic Metabolic Panel:    Component Value Date/Time   NA 137 08/09/2018 0757   K 2.5 (LL) 08/09/2018 0757   CL 107 08/09/2018 0757   CO2 24 08/09/2018 0757   BUN 7 08/09/2018 0757   CREATININE 0.44 08/09/2018 0757   GLUCOSE 105 (H) 08/09/2018 0757   CALCIUM 8.5 (L) 08/09/2018 0757   CBC:    Component Value Date/Time   WBC 9.8 08/09/2018 0757   HGB 6.9 (LL) 08/09/2018 0757   HCT 21.5 (L) 08/09/2018 0757   PLT 425 (H) 08/09/2018 0757   MCV 101.9 (H) 08/09/2018 0757   NEUTROABS 8.0 (H) 08/07/2018 0523   LYMPHSABS 2.8 08/07/2018 0523   MONOABS 0.8 08/07/2018 0523   EOSABS 0.0 08/07/2018 0523   BASOSABS 0.0 08/07/2018 0523    Recent Results (from the past 240 hour(s))  SARS Coronavirus 2 (CEPHEID - Performed in [facility] lab), Hosp Order     Status: None   Collection Time: 08/07/18  4:09 AM  Result Value Ref Range Status   SARS Coronavirus 2 NEGATIVE NEGATIVE Final    Comment: (NOTE) If result is NEGATIVE SARS-CoV-2 target nucleic acids are NOT DETECTED. The SARS-CoV-2 RNA is generally detectable in upper and lower  respiratory specimens during the acute phase of  infection. The lowest  concentration of SARS-CoV-2 viral copies this assay can detect is 250  copies / mL. A negative result does not preclude SARS-CoV-2 infection  and should not be used as the sole basis for treatment or other  patient management decisions.  A negative result may occur with  improper specimen collection / handling, submission of specimen other  than nasopharyngeal swab, presence of viral mutation(s) within the  areas targeted by this assay, and inadequate number of viral copies  (<250 copies /  mL). A negative result must be combined with clinical  observations, patient history, and epidemiological information. If result is POSITIVE SARS-CoV-2 target nucleic acids are DETECTED. The SARS-CoV-2 RNA is generally detectable in upper and lower  respiratory specimens dur ing the acute phase of infection.  Positive  results are indicative of active infection with SARS-CoV-2.  Clinical  correlation with patient history and other diagnostic information is  necessary to determine patient infection status.  Positive results do  not rule out bacterial infection or co-infection with other viruses. If result is PRESUMPTIVE POSTIVE SARS-CoV-2 nucleic acids MAY BE PRESENT.   A presumptive positive result was obtained on the submitted specimen  and confirmed on repeat testing.  While 2019 novel coronavirus  (SARS-CoV-2) nucleic acids may be present in the submitted sample  additional confirmatory testing may be necessary for epidemiological  and / or clinical management purposes  to differentiate between  SARS-CoV-2 and other Sarbecovirus currently known to infect humans.  If clinically indicated additional testing with an alternate test  methodology [phone]([id number]) is advised. The SARS-CoV-2 RNA is generally  detectable in upper and lower respiratory sp ecimens during the acute  phase of infection. The expected result is Negative. Fact Sheet for Patients:  BoilerBrush.com.cyhttps://www.fda.gov/media/136312/download Fact Sheet for Healthcare Providers: https://pope.com/https://www.fda.gov/media/136313/download This test is not yet approved or cleared by the [place] FDA and has been authorized for detection and/or diagnosis of SARS-CoV-2 by FDA under an Emergency Use Authorization (EUA).  This EUA will remain in effect (meaning this test can be used) for the duration of the COVID-19 declaration under Section 564(b)(1) of the Act, 21 U.S.C. section 360bbb-3(b)(1), unless the authorization is terminated or revoked  sooner. Performed at [facility], [address] [place], [place] [address]     Studies/Results: [facility]  Result Date: 08/07/2018 [facility], placement could not be confirmed due to current cardiac rhythm.   Medications: Scheduled Meds: . feeding supplement  1 Container Oral Q24H  . feeding supplement (ENSURE ENLIVE)  237 mL Oral Q24H  . feeding supplement (PRO-STAT SUGAR FREE 64)  30 mL Oral Daily  . folic acid  1 mg Oral Daily  . HYDROmorphone   Intravenous Q4H  . hydroxyurea  500 mg Oral BID  . ketorolac  15 mg Intravenous Q6H  . multivitamin with minerals  1 tablet Oral Daily  . Rivaroxaban  15 mg Oral BID WC  . [START ON 08/28/2018] rivaroxaban  20 mg Oral Q supper  . senna-docusate  1 tablet Oral BID   Continuous Infusions: . sodium chloride 100 mL/hr at 08/09/18 0820  . potassium chloride     PRN Meds:.diphenhydrAMINE, naloxone **AND** sodium chloride flush, ondansetron (ZOFRAN) IV, oxyCODONE, polyethylene glycol  Assessment/Plan: Principal Problem:   Sickle cell pain crisis (HCC) Active Problems:   Anemia of chronic disease   Chronic pain syndrome   Opiate dependence (HCC)   Venous thromboembolism  Sickle cell disease with pain  crisis: Waiting weight-based Dilaudid PCA.  Settings changed to 0.3 mg, 10-minute lockout, and 2 mg/h. Toradol 15 mg IV every 6 hours Oxycodone 10 mg every 4 hours as needed for moderate to severe breakthrough pain Maintain oxygen saturation above 90%, 2 L supplemental oxygen as needed Decrease IV fluids to 50 mL/h  Venous thromboembolism secondary to sickle cell disease: Continue Xarelto per pharmacy consult Continue pain management  Sickle cell anemia: Hemoglobin stable.  No clinical indication for blood transfusion at this time. Continue to monitor closely Repeat CBC in a.m.  Leukocytosis: Resolved.  Patient remains afebrile.  No signs of inflammation or infection. Continue  to monitor closely  Chronic pain syndrome: Continue home medications  Hypokalemia: Potassium 2.5, replete with 3 runs of KCl. K-dur 20 mEQ daily Repeat BMP and magnesium in a.m.  Code Status: Full Code Family Communication: N/A Disposition Plan: Not yet ready for discharge  [place], MSN, FNP-C [facility] [address] [place], [place] [address] [phone]  If 5PM-7AM, please contact night-coverage.  08/09/2018, 8:57 AM  LOS: 2 days

## 2018-08-09 NOTE — Progress Notes (Signed)
PCA Dilaudid Received Total 7.3  Mg Requested 28 doses Given 17 doses

## 2018-08-09 NOTE — Progress Notes (Signed)
Total amt received 7.2 mg Requested 30 doses Given 24 doses

## 2018-08-09 NOTE — Progress Notes (Signed)
Notified by Leonia Reader in lab of critical value of K+ 2.3. Call placed to Cammie Sickle, [place] and orders to follow.Marland Kitchen

## 2018-08-09 NOTE — Plan of Care (Signed)
Patient improving with anger management. Able to verbalize feelings in a calm manner. Continuing to encourage to eat more of diet for nutritional purposes.   Low potassium today with medication ordered.

## 2018-08-09 NOTE — Progress Notes (Signed)
CSW met with patient at bedside and introduced self and role at the [facility]. Patient chose not to engage very much with CSW; patient stating her pain is not being managed.   Advised that CSW is available for support and resources at the clinic. CSW will follow given patient's high ED utilization outside the [facility] system.

## 2018-08-10 LAB — BASIC METABOLIC PANEL
Anion gap: 8 (ref 5–15)
BUN: 5 mg/dL — ABNORMAL LOW (ref 6–20)
CO2: 21 mmol/L — ABNORMAL LOW (ref 22–32)
Calcium: 8.5 mg/dL — ABNORMAL LOW (ref 8.9–10.3)
Chloride: 109 mmol/L (ref 98–111)
Creatinine, Ser: 0.49 mg/dL (ref 0.44–1.00)
GFR calc Af Amer: >60 mL/min (ref >60)
GFR calc non Af Amer: >60 mL/min (ref >60)
Glucose, Bld: 98 mg/dL (ref 70–99)
Potassium: 3.5 mmol/L (ref 3.5–5.1)
Sodium: 138 mmol/L (ref 135–145)

## 2018-08-10 LAB — CBC
HCT: 20.5 % — ABNORMAL LOW (ref 36.0–46.0)
Hemoglobin: 6.7 g/dL — CL (ref 12.0–15.0)
MCH: 31.9 pg (ref 26.0–34.0)
MCHC: 32.7 g/dL (ref 30.0–36.0)
MCV: 97.6 fL (ref 80.0–100.0)
Platelets: 499 10*3/uL — ABNORMAL HIGH (ref 150–400)
RBC: 2.1 MIL/uL — ABNORMAL LOW (ref 3.87–5.11)
RDW: 19.5 % — ABNORMAL HIGH (ref 11.5–15.5)
WBC: 9.6 10*3/uL (ref 4.0–10.5)
nRBC: 3.8 % — ABNORMAL HIGH (ref 0.0–0.2)

## 2018-08-10 MED ORDER — HYDROMORPHONE 1 MG/ML IV SOLN
INTRAVENOUS | Status: DC
  Administered 2018-08-10: 3 mg via INTRAVENOUS
  Administered 2018-08-10: 12 mg via INTRAVENOUS
  Administered 2018-08-10: 8 mg via INTRAVENOUS
  Administered 2018-08-11: 4 mg via INTRAVENOUS
  Administered 2018-08-11: 8 mg via INTRAVENOUS
  Administered 2018-08-11 (×2): 6 mg via INTRAVENOUS
  Filled 2018-08-10 (×2): qty 30

## 2018-08-10 MED ORDER — OXYCODONE HCL 5 MG PO TABS
5.0000 mg | ORAL_TABLET | ORAL | Status: DC | PRN
  Administered 2018-08-10 (×2): 5 mg via ORAL
  Filled 2018-08-10 (×2): qty 1

## 2018-08-10 MED ORDER — OXYCODONE-ACETAMINOPHEN 5-325 MG PO TABS
1.0000 | ORAL_TABLET | ORAL | Status: DC | PRN
  Administered 2018-08-10 – 2018-08-11 (×4): 1 via ORAL
  Filled 2018-08-10 (×4): qty 1

## 2018-08-10 NOTE — Progress Notes (Signed)
Subjective: Brittany Foster, a 33 year old female with a medical history significant for sickle cell disease, chronic pain syndrome, opiate dependence, history of drug-seeking behavior, MDD in remission, history of migraine headaches was admitted for DVT to right upper and lower extremities and sickle cell pain crisis.  Patient is complaining of excruciating pain to right upper arm.  Pain intensity 10/10 characterized as constant and occasionally sharp.  Patient states, "my pain medicine is not working".  Patient is maximizing PCA Dilaudid.  However she is not utilized oxycodone IR for breakthrough pain, she says "Roxicodone has never worked for me and I am not taking it".  Patient denies headache, dizziness, chest pain, shortness of breath, nausea, vomiting, or diarrhea.  Objective:  Vital signs in last 24 hours:  Vitals:   08/10/18 0014 08/10/18 0331 08/10/18 0409 08/10/18 0754  BP:   106/61   Pulse:   85   Resp: 17 16 16 15   Temp:   98.5 F (36.9 C)   TempSrc:      SpO2: 95% 98% 97% 96%  Weight:      Height:        Intake/Output from previous day:  No intake or output data in the 24 hours ending 08/10/18 1111  Physical Exam: General: Alert, awake, oriented x3, in no acute distress.  HEENT: [place]/AT PEERL, EOMI Neck: Trachea midline,  no masses, no thyromegal,y no JVD, no carotid bruit OROPHARYNX:  Moist, No exudate/ erythema/lesions.  Heart: Regular rate and rhythm, without murmurs, rubs, gallops, PMI non-displaced, no heaves or thrills on palpation.  Lungs: Clear to auscultation, no wheezing or rhonchi noted. No increased vocal fremitus resonant to percussion  Abdomen: Soft, nontender, nondistended, positive bowel sounds, no masses no hepatosplenomegaly noted..  Neuro: No focal neurological deficits noted cranial nerves II through XII grossly intact. DTRs 2+ bilaterally upper and lower extremities. Strength 5 out of 5 in bilateral upper and lower extremities. Musculoskeletal: No  warm swelling or erythema around joints, no spinal tenderness noted.  Right upper extremity tender to palpation, decreased range of motion. Psychiatric: Patient alert and oriented x3, good insight and cognition, good recent to remote recall. Lymph node survey: No cervical axillary or inguinal lymphadenopathy noted.  Lab Results:  Basic Metabolic Panel:    Component Value Date/Time   NA 138 08/10/2018 0840   K 3.5 08/10/2018 0840   CL 109 08/10/2018 0840   CO2 21 (L) 08/10/2018 0840   BUN 5 (L) 08/10/2018 0840   CREATININE 0.49 08/10/2018 0840   GLUCOSE 98 08/10/2018 0840   CALCIUM 8.5 (L) 08/10/2018 0840   CBC:    Component Value Date/Time   WBC 9.6 08/10/2018 0840   HGB 6.7 (LL) 08/10/2018 0840   HCT 20.5 (L) 08/10/2018 0840   PLT 499 (H) 08/10/2018 0840   MCV 97.6 08/10/2018 0840   NEUTROABS 8.0 (H) 08/07/2018 0523   LYMPHSABS 2.8 08/07/2018 0523   MONOABS 0.8 08/07/2018 0523   EOSABS 0.0 08/07/2018 0523   BASOSABS 0.0 08/07/2018 0523    Recent Results (from the past 240 hour(s))  SARS Coronavirus 2 (CEPHEID - Performed in [facility] hospital lab), Hosp Order     Status: None   Collection Time: 08/07/18  4:09 AM  Result Value Ref Range Status   SARS Coronavirus 2 NEGATIVE NEGATIVE Final    Comment: (NOTE) If result is NEGATIVE SARS-CoV-2 target nucleic acids are NOT DETECTED. The SARS-CoV-2 RNA is generally detectable in upper and lower  respiratory specimens during the acute phase  of infection. The lowest  concentration of SARS-CoV-2 viral copies this assay can detect is 250  copies / mL. A negative result does not preclude SARS-CoV-2 infection  and should not be used as the sole basis for treatment or other  patient management decisions.  A negative result may occur with  improper specimen collection / handling, submission of specimen other  than nasopharyngeal swab, presence of viral mutation(s) within the  areas targeted by this assay, and inadequate number of  viral copies  (<250 copies / mL). A negative result must be combined with clinical  observations, patient history, and epidemiological information. If result is POSITIVE SARS-CoV-2 target nucleic acids are DETECTED. The SARS-CoV-2 RNA is generally detectable in upper and lower  respiratory specimens dur ing the acute phase of infection.  Positive  results are indicative of active infection with SARS-CoV-2.  Clinical  correlation with patient history and other diagnostic information is  necessary to determine patient infection status.  Positive results do  not rule out bacterial infection or co-infection with other viruses. If result is PRESUMPTIVE POSTIVE SARS-CoV-2 nucleic acids MAY BE PRESENT.   A presumptive positive result was obtained on the submitted specimen  and confirmed on repeat testing.  While 2019 novel coronavirus  (SARS-CoV-2) nucleic acids may be present in the submitted sample  additional confirmatory testing may be necessary for epidemiological  and / or clinical management purposes  to differentiate between  SARS-CoV-2 and other Sarbecovirus currently known to infect humans.  If clinically indicated additional testing with an alternate test  methodology [phone]) is advised. The SARS-CoV-2 RNA is generally  detectable in upper and lower respiratory sp ecimens during the acute  phase of infection. The expected result is Negative. Fact Sheet for Patients:  StrictlyIdeas.no Fact Sheet for Healthcare Providers: BankingDealers.co.za This test is not yet approved or cleared by the [place] FDA and has been authorized for detection and/or diagnosis of SARS-CoV-2 by FDA under an Emergency Use Authorization (EUA).  This EUA will remain in effect (meaning this test can be used) for the duration of the COVID-19 declaration under Section 564(b)(1) of the Act, 21 U.S.C. section 360bbb-3(b)(1), unless the authorization is  terminated or revoked sooner. Performed at [name], [place] [address], [place], [place] [address]     Studies/Results: No results found.  Medications: Scheduled Meds: . feeding supplement  1 Container Oral Q24H  . feeding supplement (ENSURE ENLIVE)  237 mL Oral Q24H  . feeding supplement (PRO-STAT SUGAR FREE 64)  30 mL Oral Daily  . folic acid  1 mg Oral Daily  . HYDROmorphone   Intravenous Q4H  . hydroxyurea  500 mg Oral BID  . ketorolac  15 mg Intravenous Q6H  . multivitamin with minerals  1 tablet Oral Daily  . Rivaroxaban  15 mg Oral BID WC  . [START ON 08/28/2018] rivaroxaban  20 mg Oral Q supper  . senna-docusate  1 tablet Oral BID   Continuous Infusions: . sodium chloride 100 mL/hr at 08/09/18 1723   PRN Meds:.diphenhydrAMINE, naloxone **AND** sodium chloride flush, ondansetron (ZOFRAN) IV, oxyCODONE-acetaminophen **AND** oxyCODONE, polyethylene glycol  Assessment/Plan: Principal Problem:   Sickle cell pain crisis ([place]) Active Problems:   Anemia of chronic disease   Chronic pain syndrome   Opiate dependence (HCC)   Venous thromboembolism  Sickle cell disease with pain crisis: Continue weight-based Dilaudid PCA with settings of 0.5 mg, 10-minute lockout, and 2 mg/h. Toradol 15 mg every 6 hours Percocet 10-325 mg every  4 hours as needed for moderate to severe breakthrough pain Maintain oxygen saturation above 90%, 2 L supplemental oxygen as needed [facility]  Venous thromboembolism secondary to sickle cell disease: Continue Xarelto per pharmacy consult Continue pain management  Sickle cell anemia: Hemoglobin 6.7, stable.  Patient does not meet threshold for blood transfusion at this time.  She is typically not transfused unless hemoglobin is less than 6.0 g/dL. Continue to follow CBC  Leukocytosis: Resolved.  Continue to follow CBC  Chronic pain syndrome: Continue home medication   Hypokalemia: Resolved.  Potassium  repleted.  Continue to monitor closely  Code Status: Full Code Family Communication: N/A Disposition Plan: Not yet ready for discharge  Nolon NationsLachina Moore Caryle Helgeson  APRN, MSN, FNP-C [facility] [address]  [place], [place] [address] [phone]  If 5PM-7AM, please contact night-coverage.  08/10/2018, 11:11 AM  LOS: 3 days

## 2018-08-11 DIAGNOSIS — D571 Sickle-cell disease without crisis: Secondary | ICD-10-CM

## 2018-08-11 DIAGNOSIS — G629 Polyneuropathy, unspecified: Secondary | ICD-10-CM

## 2018-08-11 MED ORDER — HYDROMORPHONE HCL 1 MG/ML IJ SOLN
2.0000 mg | Freq: Once | INTRAMUSCULAR | Status: AC
  Administered 2018-08-11: 2 mg via INTRAVENOUS

## 2018-08-11 MED ORDER — RIVAROXABAN (XARELTO) VTE STARTER PACK (15 & 20 MG): ORAL_TABLET | ORAL | 0 refills | Status: DC

## 2018-08-11 NOTE — Discharge Summary (Signed)
Physician Discharge Summary  Brittany Foster MRN:[id number] DOB: 03/22/1985 DOA: 08/07/2018  PCP: System, Pcp Not In  Admit date: 08/07/2018  Discharge date: 08/11/2018  Discharge Diagnoses:  Principal Problem:   Sickle cell pain crisis (HCC) Active Problems:   Anemia of chronic disease   Chronic pain syndrome   Opiate dependence (HCC)   Venous thromboembolism   Discharge Condition: Stable  Disposition:  Pt is discharged home in good condition and is to follow up with Natalie Stroud, FNP in 1 week this week to have labs evaluated. Adesuwa Warmuth is instructed to increase activity slowly and balance with rest for the next few days, and use prescribed medication to complete treatment of pain  Diet: Regular Wt Readings from Last 3 Encounters:  08/07/18 53.2 kg  03/07/17 57.1 kg  02/24/17 60.3 kg    History of present illness:  Brittany Foster, a 33-year-old female with a medical history significant for sickle cell disease, type SS, history of drug-seeking behavior, history of depression, migraine headaches, chronic pain syndrome, and opiate dependence [facility] [place] for the management of sickle cell pain crisis.  Patient reports a 3-day history of severe pain primarily to right upper and right lower extremities.  Patient did not notice any swelling in these areas.  She denied a history of blood clots.  Patient states that she continues to have 8/10 pain intensity despite receiving doses of pain medications.  Patient denies chest pain, shortness of breath, cough, or abdominal pain.  She states that she was previously referred to sickle cell center at [facility], who prescribes pain medications.  [facility] Patient febrile, temperature 100 F, pulse 90, respiratory rate 16, BP 112/66, oxygen saturation 99% on RA. WBCs 10.1, hemoglobin 8.9, consistent with baseline. AST 65, ALT 33, alk phos 181, total  bilirubin 1.7.  UDS negative.  Urinalysis unremarkable.  EKG shows sinus tachycardia, no acute ischemic changes. Chest x-ray shows no acute cardiopulmonary process. Right upper extremity ultrasound shows no evidence of thrombus within the proximal right internal jugular vein through the right brachial vein.  Occlusive thrombus in the right cephalic vein.  Right lower extremity ultrasound showed occlusive thrombus of 1 of 2 paired peroneal veins.  Saline bolus, fentanyl, IV Benadryl, and IV heparin initiated. Patient transferred to [facility] at [place] [facility] for management of sickle cell pain crisis and DVT in right upper and lower extremities.  Hospital Course:   Sickle cell disease with pain crisis:  Patient was admitted for sickle cell pain crisis and managed appropriately with IVF, IV Dilaudid via PCA and IV Toradol, as well as other adjunct therapies per sickle cell pain management protocols.  PCA Dilaudid weaned appropriately.  Percocet 10-3 25 every 4 hours per home dose was restarted.  Patient is typically followed by [facility], and she was advised to follow-up as scheduled for medication management. Pain intensity decreased to 6/10, which is improved from admission.  DVT of right upper and lower extremities: Right upper extremity ultrasound showed occlusive thrombus within the right cephalic vein and right lower extremity ultrasound showed occlusive thrombus in 1 of 2 paired peroneal veins.  Initially, patient was placed on IV heparin.  Patient had poor venous access and was unable to continue IV heparin.  Patient was transitioned to Xarelto.  Patient tolerates Xarelto well, no signs of bleeding, creatinine at baseline.  Xarelto starter kit per pharmacy.   Discussed the patient will need to   be on Xarelto for a total of 6 months. Patient will follow-up with Natalie Stroud, FNP on 08/19/2018 to establish care and to further discuss Xarelto. Medication discussed at length prior to  discharge.  Patient expressed understanding.  She stated that she did not have further questions concerning medication.  Hypokalemia: Resolved throughout admission after potassium repletion.  Patient advised to continue potassium as previously prescribed.  Also, recommend a potassium rich diet.  Patient and I discussed at length.  Patient alert, oriented, and ambulating without assistance.  She is afebrile and maintaining oxygen saturation at 100% on room air.  Pain intensity decreased to 6/10. Patient was discharged home today in a hemodynamically stable condition.   Discharge Exam: Vitals:   08/11/18 1105 08/11/18 1327  BP:  114/68  Pulse:  (!) 115  Resp: 12 12  Temp:  98.6 F (37 C)  SpO2: 97% 100%   Vitals:   08/11/18 0413 08/11/18 0804 08/11/18 1105 08/11/18 1327  BP: 104/65 102/60  114/68  Pulse: 84 84  (!) 115  Resp: 20 20 12 12  Temp: 98.5 F (36.9 C) 98.2 F (36.8 C)  98.6 F (37 C)  TempSrc:  Oral    SpO2: 99% 99% 97% 100%  Weight:      Height:        General appearance : Awake, alert, not in any distress. Speech Clear. Not toxic looking HEENT: Atraumatic and Normocephalic, pupils equally reactive to light and accomodation Neck: Supple, no JVD. No cervical lymphadenopathy.  Chest: Good air entry bilaterally, no added sounds  CVS: S1 S2 regular, no murmurs.  Abdomen: Bowel sounds present, Non tender and not distended with no gaurding, rigidity or rebound. Extremities: B/L Lower Ext shows no edema, both legs are warm to touch Neurology: Awake alert, and oriented X 3, CN II-XII intact, Non focal Skin: No Rash  Discharge Instructions  Discharge Instructions    Discharge patient   Complete by: As directed    Discharge disposition: 01-Home or Self Care   Discharge patient date: 08/11/2018     Allergies as of 08/11/2018      Reactions   Ultram [tramadol] Other (See Comments)   Reaction:  Seizures    Zofran [ondansetron Hcl] Nausea And Vomiting   Buprenorphine  Hcl Hives, Other (See Comments)   Reaction:  Shaking    Fentanyl Hives   Morphine And Related Hives, Other (See Comments)   Reaction:  Shaking    Tape Rash      Medication List    TAKE these medications   DULoxetine 60 MG capsule Commonly known as: CYMBALTA TAKE 1 CAPSULE BY MOUTH DAILY.   folic acid 1 MG tablet Commonly known as: FOLVITE Take 1 tablet (1 mg total) by mouth daily.   hydroxyurea 500 MG capsule Commonly known as: HYDREA TAKE 2 CAPSULES BY MOUTH DAILY. MAY TAKE WITH FOOD TO MINIMIZE GI SIDE EFFECTS. What changed:   how much to take  how to take this  when to take this  additional instructions   ibuprofen 200 MG tablet Commonly known as: ADVIL Take 400 mg by mouth every 6 (six) hours as needed for fever, headache, mild pain or moderate pain.   Jadenu 360 MG Tabs Generic drug: Deferasirox Take 720 mg by mouth daily after breakfast.   lactulose 20 g packet Commonly known as: CEPHULAC Take 1 packet (20 g total) by mouth 3 (three) times daily as needed.   medroxyPROGESTERone 150 MG/ML injection Commonly known as: DEPO-PROVERA Inject   150 mg into the muscle every 3 (three) months.   oxyCODONE-acetaminophen 10-325 MG tablet Commonly known as: Percocet Take 1 tablet by mouth every 4 (four) hours as needed for pain.   potassium chloride SA 20 MEQ tablet Commonly known as: K-DUR Take 2 tablets (40 mEq total) by mouth daily.   Rivaroxaban 15 & 20 MG Tbpk Take as directed on package: Start with one 53m tablet by mouth twice a day with food. On Day 22, switch to one 214mtablet once a day with food.   Topiramate ER 100 MG Cp24 Commonly known as: TROKENDI XR Take 200 mg by mouth at bedtime. What changed: how much to take   traZODone 50 MG tablet Commonly known as: DESYREL Take 0.5-1 tablets (25-50 mg total) by mouth at bedtime as needed for sleep.   Vitamin D (Ergocalciferol) 1.25 MG (50000 UT) Caps capsule Commonly known as: DRISDOL Take 50,000  Units by mouth every 7 (seven) days.       The results of significant diagnostics from this hospitalization (including imaging, microbiology, ancillary and laboratory) are listed below for reference.    Significant Diagnostic Studies: [place] Site Rite  Result Date: 08/07/2018 If Site Rite image not attached, placement could not be confirmed due to current cardiac rhythm.   Microbiology: Recent Results (from the past 240 hour(s))  SARS Coronavirus 2 (CEPHEID - Performed in [facility]), Hosp Order     Status: None   Collection Time: 08/07/18  4:09 AM   Specimen: Nasopharyngeal Swab  Result Value Ref Range Status   SARS Coronavirus 2 NEGATIVE NEGATIVE Final    Comment: (NOTE) If result is NEGATIVE SARS-CoV-2 target nucleic acids are NOT DETECTED. The SARS-CoV-2 RNA is generally detectable in upper and lower  respiratory specimens during the acute phase of infection. The lowest  concentration of SARS-CoV-2 viral copies this assay can detect is 250  copies / mL. A negative result does not preclude SARS-CoV-2 infection  and should not be used as the sole basis for treatment or other  patient management decisions.  A negative result may occur with  improper specimen collection / handling, submission of specimen other  than nasopharyngeal swab, presence of viral mutation(s) within the  areas targeted by this assay, and inadequate number of viral copies  (<250 copies / mL). A negative result must be combined with clinical  observations, patient history, and epidemiological information. If result is POSITIVE SARS-CoV-2 target nucleic acids are DETECTED. The SARS-CoV-2 RNA is generally detectable in upper and lower  respiratory specimens dur ing the acute phase of infection.  Positive  results are indicative of active infection with SARS-CoV-2.  Clinical  correlation with patient history and other diagnostic information is  necessary to determine patient infection status.   Positive results do  not rule out bacterial infection or co-infection with other viruses. If result is PRESUMPTIVE POSTIVE SARS-CoV-2 nucleic acids MAY BE PRESENT.   A presumptive positive result was obtained on the submitted specimen  and confirmed on repeat testing.  While 2019 novel coronavirus  (SARS-CoV-2) nucleic acids may be present in the submitted sample  additional confirmatory testing may be necessary for epidemiological  and / or clinical management purposes  to differentiate between  SARS-CoV-2 and other Sarbecovirus currently known to infect humans.  If clinically indicated additional testing with an alternate test  methodology ([id number] [id number] advised. The SARS-CoV-2 RNA is generally  detectable in upper and lower respiratory sp ecimens during the acute  phase  of infection. The expected result is Negative. Fact Sheet for Patients:  StrictlyIdeas.no Fact Sheet for Healthcare Providers: BankingDealers.co.za This test is not yet approved or cleared by the [place] FDA and has been authorized for detection and/or diagnosis of SARS-CoV-2 by FDA under an Emergency Use Authorization (EUA).  This EUA will remain in effect (meaning this test can be used) for the duration of the COVID-19 declaration under Section 564(b)(1) of the Act, 21 U.S.C. section 360bbb-3(b)(1), unless the authorization is terminated or revoked sooner. Performed at [facility], [place] [address], [place], [place] [address]      Labs: Basic Metabolic Panel: Recent Labs  Lab 08/07/18 0523 08/09/18 0757 08/10/18 0840  NA 140 137 138  K 2.3* 2.5* 3.5  CL 110 107 109  CO2 22 24 21*  GLUCOSE 82 105* 98  BUN <5* 7 5*  CREATININE 0.51 0.44 0.49  CALCIUM 8.3* 8.5* 8.5*  MG  --  1.8  --    Liver Function Tests: Recent Labs  Lab 08/07/18 0523  AST 34  ALT 19  ALKPHOS 158*  BILITOT 0.8  PROT 6.3*  ALBUMIN 2.9*   No results for  input(s): LIPASE, AMYLASE in the last 168 hours. No results for input(s): AMMONIA in the last 168 hours. CBC: Recent Labs  Lab 08/07/18 0523 08/09/18 0757 08/10/18 0840  WBC 11.8* 9.8 9.6  NEUTROABS 8.0*  --   --   HGB 6.9* 6.9* 6.7*  HCT 22.2* 21.5* 20.5*  MCV 100.9* 101.9* 97.6  PLT 429* 425* 499*   Cardiac Enzymes: No results for input(s): CKTOTAL, CKMB, CKMBINDEX, TROPONINI in the last 168 hours. BNP: Invalid input(s): POCBNP CBG: No results for input(s): GLUCAP in the last 168 hours.  Time coordinating discharge: 50 minutes  Signed:  Donia Pounds  APRN, MSN, FNP-C [facility] [address] [place], [place] [address] [phone]  [facility] 08/11/2018, 1:39 PM   THIS NOTE WAS PREPARED USING DRAGON SOFTWARE.

## 2018-08-11 NOTE — Progress Notes (Signed)
Patient discharged to home with family, discharge instructions reviewed with patient who verbalized understanding. 

## 2018-08-18 ENCOUNTER — Telehealth: Payer: Self-pay

## 2018-08-18 NOTE — Telephone Encounter (Signed)
Patient was negative for the cov-19 screening and will be coming in office

## 2018-08-19 ENCOUNTER — Encounter: Payer: Self-pay | Admitting: Family Medicine

## 2018-08-19 ENCOUNTER — Ambulatory Visit (INDEPENDENT_AMBULATORY_CARE_PROVIDER_SITE_OTHER): Payer: Medicaid Other | Admitting: Family Medicine

## 2018-08-19 ENCOUNTER — Other Ambulatory Visit: Payer: Self-pay

## 2018-08-19 VITALS — BP 99/60 | HR 90 | Temp 98.4°F | Ht 60.0 in | Wt 119.6 lb

## 2018-08-19 DIAGNOSIS — E559 Vitamin D deficiency, unspecified: Secondary | ICD-10-CM

## 2018-08-19 DIAGNOSIS — R8290 Unspecified abnormal findings in urine: Secondary | ICD-10-CM | POA: Diagnosis not present

## 2018-08-19 DIAGNOSIS — Z3042 Encounter for surveillance of injectable contraceptive: Secondary | ICD-10-CM

## 2018-08-19 DIAGNOSIS — Z7689 Persons encountering health services in other specified circumstances: Secondary | ICD-10-CM | POA: Diagnosis not present

## 2018-08-19 DIAGNOSIS — D571 Sickle-cell disease without crisis: Secondary | ICD-10-CM | POA: Diagnosis not present

## 2018-08-19 DIAGNOSIS — Z09 Encounter for follow-up examination after completed treatment for conditions other than malignant neoplasm: Secondary | ICD-10-CM

## 2018-08-19 LAB — POCT URINALYSIS DIP (MANUAL ENTRY)
Bilirubin, UA: NEGATIVE
Blood, UA: NEGATIVE
Glucose, UA: NEGATIVE mg/dL
Ketones, POC UA: NEGATIVE mg/dL
Nitrite, UA: NEGATIVE
Protein Ur, POC: NEGATIVE mg/dL
Spec Grav, UA: 1.015 (ref 1.010–1.025)
Urobilinogen, UA: 2 E.U./dL — AB
pH, UA: 6 (ref 5.0–8.0)

## 2018-08-19 LAB — POCT URINE PREGNANCY: Preg Test, Ur: NEGATIVE

## 2018-08-19 MED ORDER — VITAMIN D (ERGOCALCIFEROL) 1.25 MG (50000 UNIT) PO CAPS: 50000.0000 [IU] | ORAL_CAPSULE | ORAL | 3 refills | Status: AC

## 2018-08-19 MED ORDER — FOLIC ACID 1 MG PO TABS: 1.0000 mg | ORAL_TABLET | Freq: Every day | ORAL | 3 refills | Status: DC

## 2018-08-19 MED ORDER — TOPIRAMATE ER 100 MG PO CAP24: 100.0000 mg | ORAL_CAPSULE | Freq: Every day | ORAL | 3 refills | Status: DC

## 2018-08-19 MED ORDER — DEFERASIROX 360 MG PO TABS: 720.0000 mg | ORAL_TABLET | Freq: Every day | ORAL | 0 refills | Status: DC

## 2018-08-19 MED ORDER — HYDROXYUREA 500 MG PO CAPS: 500.0000 mg | ORAL_CAPSULE | Freq: Every day | ORAL | 3 refills | Status: DC

## 2018-08-19 MED ORDER — MEDROXYPROGESTERONE ACETATE 150 MG/ML IM SUSY
150.0000 mg | PREFILLED_SYRINGE | Freq: Once | INTRAMUSCULAR | Status: AC
  Administered 2018-08-19: 150 mg via INTRAMUSCULAR

## 2018-08-19 NOTE — Progress Notes (Signed)
[facility]   New Patient--Hospital Follow Up--Establish Care  Subjective:  Patient ID: Brittany CampbellMiranda Adolph, female    DOB: 09/24/1985  Age: 33 y.o. MRN: [id number]  CC:  Chief Complaint  Patient presents with  . Re-establish    sickle cell    HPI Brittany Foster is a 33 year old female who presents for Hospital Follow Up and to Establish Care today.   Past Medical History:  Diagnosis Date  . Anemia   . Depression, major, recurrent (HCC)   . Migraines   . Sickle cell anemia (HCC)    Current Status: Since her last office visit, she has been lost to follow up with a PCP, but has [facility]. She is doing well with no complaints. She states that she has pain in her right leg and right arm. He rates his pain today at 4/10. He has not had a hospital visit for Sickle Cell Crisis since 08/07/2018 where he was treated and discharged on 08/11/2018. She is currently with [facility], who prescribes Percocet. She continues [facility], however she has not been able to travel as much. She is currently taking all medications as prescribed and staying well hydrated. She reports occasional nausea, constipation, dizziness and headaches. Her anxiety is mild today. She denies suicidal ideations, homicidal ideations, or auditory hallucinations. She denies fevers, chills, fatigue, recent infections, weight loss, and night sweats. She has not had any visual changes, and falls. No chest pain, heart palpitations, cough and shortness of breath reported. No reports of GI problems such as vomiting, and diarrhea. She has no reports of blood in stools, dysuria and hematuria.   Past Surgical History:  Procedure Laterality Date  . CESAREAN SECTION    . CHOLECYSTECTOMY  2000  . IR GENERIC HISTORICAL  10/08/2015   IR US GUIDE VASC ACCESS RIGHT 10/08/2015 Simonne ComeJohn Watts, MD [facility]  . IR GENERIC HISTORICAL  10/08/2015   IR FLUORO  GUIDE CV LINE RIGHT 10/08/2015 Simonne ComeJohn Watts, MD [facility]  . IR REMOVAL TUN CV CATH W/O FL  03/09/2017  . MULTIPLE TOOTH EXTRACTIONS N/A   . port a cath placement Right    about 6-7 years ago  . removal of porta cath Right 09/11/15  . TUBAL LIGATION      Family History  Problem Relation Age of Onset  . Sickle cell trait Father   . Sickle cell trait Mother   . Sickle cell anemia Other     Social History   Socioeconomic History  . Marital status: Single    Spouse name: Not on file  . Number of children: Not on file  . Years of education: Not on file  . Highest education level: Not on file  Occupational History  . Occupation: None  Social Needs  . Financial resource strain: Not on file  . Food insecurity    Worry: Not on file    Inability: Not on file  . Transportation needs    Medical: Not on file    Non-medical: Not on file  Tobacco Use  . Smoking status: Current Some Day Smoker    Packs/day: 1.00  . Smokeless tobacco: Current User  Substance and Sexual Activity  . Alcohol use: No    Alcohol/week: 0.0 standard drinks  . Drug use: No  . Sexual activity: Yes    Partners: Male    Birth control/protection: Injection  Comment: 1 new partner recently, no concern about STI, uses condoms  Lifestyle  . Physical activity    Days per week: Not on file    Minutes per session: Not on file  . Stress: Not on file  Relationships  . Social Musician on phone: Not on file    Gets together: Not on file    Attends religious service: Not on file    Active member of club or organization: Not on file    Attends meetings of clubs or organizations: Not on file    Relationship status: Not on file  . Intimate partner violence    Fear of current or ex partner: Not on file    Emotionally abused: Not on file    Physically abused: Not on file    Forced sexual activity: Not on file  Other Topics Concern  . Not on file  Social History Narrative   86 year old daughter     Outpatient Medications Prior to Visit  Medication Sig Dispense Refill  . medroxyPROGESTERone (DEPO-PROVERA) 150 MG/ML injection Inject 150 mg into the muscle every 3 (three) months.     Marland Kitchen oxyCODONE-acetaminophen (PERCOCET) 10-325 MG tablet Take 1 tablet by mouth every 4 (four) hours as needed for pain. 24 tablet 0  . Rivaroxaban 15 & 20 MG TBPK Take as directed on package: Start with one  tablet by mouth twice a day with food. On Day 22, switch to one  tablet once a day with food. 51 each 0  . Deferasirox (JADENU) 360 MG TABS Take 720 mg by mouth daily after breakfast.    . folic acid (FOLVITE) 1 MG tablet Take 1 tablet (1 mg total) by mouth daily. 90 tablet 3  . hydroxyurea (HYDREA) 500 MG capsule TAKE 2 CAPSULES BY MOUTH DAILY. MAY TAKE WITH FOOD TO MINIMIZE GI SIDE EFFECTS. (Patient taking differently: Take 500 mg by mouth daily. ) 180 capsule 3  . Topiramate ER 100 MG CP24 Take 200 mg by mouth at bedtime. (Patient taking differently: Take 100 mg by mouth at bedtime. ) 60 capsule 3  . DULoxetine (CYMBALTA) 60 MG capsule TAKE 1 CAPSULE BY MOUTH DAILY. (Patient not taking: Reported on 08/07/2018) 30 capsule 3  . ibuprofen (ADVIL,MOTRIN) 200 MG tablet Take 400 mg by mouth every 6 (six) hours as needed for fever, headache, mild pain or moderate pain.     Marland Kitchen lactulose (CEPHULAC) 20 g packet Take 1 packet (20 g total) by mouth 3 (three) times daily as needed. (Patient not taking: Reported on 08/07/2018) 30 each 0  . potassium chloride SA (K-DUR,KLOR-CON) 20 MEQ tablet Take 2 tablets (40 mEq total) by mouth daily. (Patient not taking: Reported on 08/07/2018) 30 tablet 0  . traZODone (DESYREL) 50 MG tablet Take 0.5-1 tablets (25-50 mg total) by mouth at bedtime as needed for sleep. (Patient not taking: Reported on 08/07/2018) 30 tablet 1  . Vitamin D, Ergocalciferol, (DRISDOL) 1.25 MG (50000 UT) CAPS capsule Take 50,000 Units by mouth every 7 (seven) days.     No facility-administered medications prior to  visit.     Allergies  Allergen Reactions  . Ultram [Tramadol] Other (See Comments)    Reaction:  Seizures   . Zofran [Ondansetron Hcl] Nausea And Vomiting  . Buprenorphine Hcl Hives and Other (See Comments)    Reaction:  Shaking   . Fentanyl Hives  . Morphine And Related Hives and Other (See Comments)    Reaction:  Shaking   .  Tape Rash    ROS Review of Systems  Constitutional: Negative.   HENT: Negative.   Eyes: Negative.   Respiratory: Negative.   Cardiovascular: Negative.   Gastrointestinal: Positive for constipation (occasional) and nausea (occasional).  Endocrine: Negative.   Musculoskeletal: Positive for back pain (occasional).  Allergic/Immunologic: Negative.   Neurological: Positive for dizziness (occasional) and headaches (occasional).  Hematological: Negative.   Psychiatric/Behavioral: Negative.       Objective:    Physical Exam  Constitutional: She is oriented to person, place, and time. She appears well-developed and well-nourished.  HENT:  Head: Normocephalic and atraumatic.  Eyes: Conjunctivae are normal.  Neck: Normal range of motion. Neck supple.  Cardiovascular: Normal rate, regular rhythm, normal heart sounds and intact distal pulses.  Pulmonary/Chest: Effort normal and breath sounds normal.  Abdominal: Soft. Bowel sounds are normal.  Musculoskeletal: Normal range of motion.  Neurological: She is alert and oriented to person, place, and time. She has normal reflexes.  Skin: Skin is warm and dry.  Psychiatric: She has a normal mood and affect. Her behavior is normal. Judgment and thought content normal.  Nursing note and vitals reviewed.   BP 99/60 (BP Location: Left Arm, Patient Position: Sitting, Cuff Size: Small)   Pulse 90   Temp 98.4 F (36.9 C) (Oral)   Ht 5' (1.524 m)   Wt 119 lb 9.6 oz (54.3 kg)   LMP 08/03/2018   SpO2 95%   BMI 23.36 kg/m  Wt Readings from Last 3 Encounters:  08/19/18 119 lb 9.6 oz (54.3 kg)  08/07/18 117 lb 4.6  oz (53.2 kg)  03/07/17 125 lb 14.1 oz (57.1 kg)     Health Maintenance Due  Topic Date Due  . PAP SMEAR-Modifier  07/31/2005    There are no preventive care reminders to display for this patient.  Lab Results  Component Value Date   TSH 3.559 03/27/2014   Lab Results  Component Value Date   WBC 9.6 08/10/2018   HGB 6.7 (LL) 08/10/2018   HCT 20.5 (L) 08/10/2018   MCV 97.6 08/10/2018   PLT 499 (H) 08/10/2018   Lab Results  Component Value Date   NA 138 08/10/2018   K 3.5 08/10/2018   CO2 21 (L) 08/10/2018   GLUCOSE 98 08/10/2018   BUN 5 (L) 08/10/2018   CREATININE 0.49 08/10/2018   BILITOT 0.8 08/07/2018   ALKPHOS 158 (H) 08/07/2018   AST 34 08/07/2018   ALT 19 08/07/2018   PROT 6.3 (L) 08/07/2018   ALBUMIN 2.9 (L) 08/07/2018   CALCIUM 8.5 (L) 08/10/2018   ANIONGAP 8 08/10/2018   No results found for: CHOL No results found for: HDL No results found for: LDLCALC No results found for: TRIG No results found for: CHOLHDL No results found for: [id number]    Assessment & Plan:   1. Hospital discharge follow-up  2. Encounter to establish care - POCT urinalysis dipstick  3. Hb-SS disease without crisis [facility](HCC) She is doing well today. She will continue to take pain medications as prescribed; will continue to avoid extreme heat and cold; will continue to eat a healthy diet and drink at least 64 ounces of water daily; continue stool softener as needed; will avoid colds and flu; will continue to get plenty of sleep and rest; will continue to avoid high stressful situations and remain infection free; will continue Folic Acid 1 mg daily to avoid sickle cell crisis.  - Deferasirox (JADENU) 360 MG TABS; Take 2 tablets (720 mg total)  by mouth daily after breakfast.  Dispense: 60 tablet; Refill: 0 - folic acid (FOLVITE) 1 MG tablet; Take 1 tablet (1 mg total) by mouth daily.  Dispense: 90 tablet; Refill: 3 - hydroxyurea (HYDREA) 500 MG capsule; Take 1 capsule (500 mg total) by  mouth daily.  Dispense: 30 capsule; Refill: 3 - CBC with Differential - Comprehensive metabolic panel - Hemoglobinopathy evaluation - Sickle Cell Panel - TSH - Vitamin D, 25-hydroxy - Vitamin B12 - POCT Urine Drug Screen  4. Encounter for Depo-Provera contraception - POCT urine pregnancy - medroxyPROGESTERone Acetate SUSY 150 mg  5. Abnormal urinalysis Results are pending. - Urine Culture    6. Depo-Provera contraceptive status - Topiramate ER (TROKENDI XR) 100 MG CP24; Take 100 mg by mouth at bedtime.  Dispense: 30 capsule; Refill: 3  7. Vitamin D deficiency - Vitamin D, Ergocalciferol, (DRISDOL) 1.25 MG (50000 UT) CAPS capsule; Take 1 capsule (50,000 Units total) by mouth every 7 (seven) days.  Dispense: 5 capsule; Refill: 3  8. Follow up She will follow up in 2 months.   Meds ordered this encounter  Medications  . medroxyPROGESTERone Acetate SUSY 150 mg  . Deferasirox (JADENU) 360 MG TABS    Sig: Take 2 tablets (720 mg total) by mouth daily after breakfast.    Dispense:  60 tablet    Refill:  0  . folic acid (FOLVITE) 1 MG tablet    Sig: Take 1 tablet (1 mg total) by mouth daily.    Dispense:  90 tablet    Refill:  3  . hydroxyurea (HYDREA) 500 MG capsule    Sig: Take 1 capsule (500 mg total) by mouth daily.    Dispense:  30 capsule    Refill:  3  . Topiramate ER (TROKENDI XR) 100 MG CP24    Sig: Take 100 mg by mouth at bedtime.    Dispense:  30 capsule    Refill:  3  . Vitamin D, Ergocalciferol, (DRISDOL) 1.25 MG (50000 UT) CAPS capsule    Sig: Take 1 capsule (50,000 Units total) by mouth every 7 (seven) days.    Dispense:  5 capsule    Refill:  3    Orders Placed This Encounter  Procedures  . Urine Culture  . CBC with Differential  . Comprehensive metabolic panel  . Hemoglobinopathy evaluation  . Sickle Cell Panel  . TSH  . Vitamin D, 25-hydroxy  . Vitamin B12  . POCT urinalysis dipstick  . POCT urine pregnancy  . POCT Urine Drug Screen     Referral Orders  No referral(s) requested today    Raliegh IpNatalie Michaiah Maiden,  MSN, FNP-BC [facility] [address]  [place], [place] [id number] [phone]    Problem List Items Addressed This Visit      Other   Hb-SS disease without crisis (HCC) (Chronic)   Relevant Medications   Deferasirox (JADENU) 360 MG TABS   folic acid (FOLVITE) 1 MG tablet   hydroxyurea (HYDREA) 500 MG capsule   Other Relevant Orders   CBC with Differential   Comprehensive metabolic panel   Hemoglobinopathy evaluation   Sickle Cell Panel   TSH   Vitamin D, 25-hydroxy   Vitamin B12   POCT Urine Drug Screen   Vitamin D deficiency   Relevant Medications   Vitamin D, Ergocalciferol, (DRISDOL) 1.25 MG (50000 UT) CAPS capsule    Other Visit Diagnoses    Hospital discharge follow-up    -  Primary  Encounter to establish care       Relevant Orders   POCT urinalysis dipstick (Completed)   Encounter for Depo-Provera contraception       Relevant Medications   medroxyPROGESTERone Acetate SUSY 150 mg (Completed)   Other Relevant Orders   POCT urine pregnancy (Completed)   Abnormal urinalysis       Relevant Orders   Urine Culture   Depo-Provera contraceptive status       Relevant Medications   Topiramate ER (TROKENDI XR) 100 MG CP24   Follow up          Meds ordered this encounter  Medications  . medroxyPROGESTERone Acetate SUSY 150 mg  . Deferasirox (JADENU) 360 MG TABS    Sig: Take 2 tablets (720 mg total) by mouth daily after breakfast.    Dispense:  60 tablet    Refill:  0  . folic acid (FOLVITE) 1 MG tablet    Sig: Take 1 tablet (1 mg total) by mouth daily.    Dispense:  90 tablet    Refill:  3  . hydroxyurea (HYDREA) 500 MG capsule    Sig: Take 1 capsule (500 mg total) by mouth daily.    Dispense:  30 capsule    Refill:  3  . Topiramate ER (TROKENDI XR) 100 MG CP24    Sig: Take 100 mg by mouth at bedtime.    Dispense:  30 capsule    Refill:  3  .  Vitamin D, Ergocalciferol, (DRISDOL) 1.25 MG (50000 UT) CAPS capsule    Sig: Take 1 capsule (50,000 Units total) by mouth every 7 (seven) days.    Dispense:  5 capsule    Refill:  3    Follow-up: Return in about 2 months (around 10/19/2018).    Azzie Glatter, FNP

## 2018-08-21 LAB — URINE CULTURE

## 2018-08-23 LAB — CMP14+CBC/D/PLT+FER+RETIC+V...
ALT: 30 IU/L (ref 0–32)
AST: 65 IU/L — ABNORMAL HIGH (ref 0–40)
Albumin/Globulin Ratio: 1.3 (ref 1.2–2.2)
Albumin: 3.7 g/dL — ABNORMAL LOW (ref 3.8–4.8)
Alkaline Phosphatase: 180 IU/L — ABNORMAL HIGH (ref 39–117)
BUN/Creatinine Ratio: 10 (ref 9–23)
BUN: 6 mg/dL (ref 6–20)
Basophils Absolute: 0.1 10*3/uL (ref 0.0–0.2)
Basos: 1 %
Bilirubin Total: 1.7 mg/dL — ABNORMAL HIGH (ref 0.0–1.2)
CO2: 20 mmol/L (ref 20–29)
Calcium: 9 mg/dL (ref 8.7–10.2)
Chloride: 107 mmol/L — ABNORMAL HIGH (ref 96–106)
Creatinine, Ser: 0.6 mg/dL (ref 0.57–1.00)
EOS (ABSOLUTE): 0.2 10*3/uL (ref 0.0–0.4)
Eos: 2 %
Ferritin: 18729 ng/mL — ABNORMAL HIGH (ref 15–150)
GFR calc Af Amer: 138 mL/min/{1.73_m2} (ref >59)
GFR calc non Af Amer: 119 mL/min/{1.73_m2} (ref >59)
Globulin, Total: 2.8 g/dL (ref 1.5–4.5)
Glucose: 84 mg/dL (ref 65–99)
Hematocrit: 25.5 % — ABNORMAL LOW (ref 34.0–46.6)
Hemoglobin: 7.7 g/dL — ABNORMAL LOW (ref 11.1–15.9)
Immature Grans (Abs): 0.1 10*3/uL (ref 0.0–0.1)
Immature Granulocytes: 1 %
Lymphocytes Absolute: 3.7 10*3/uL — ABNORMAL HIGH (ref 0.7–3.1)
Lymphs: 34 %
MCH: 31.7 pg (ref 26.6–33.0)
MCHC: 30.2 g/dL — ABNORMAL LOW (ref 31.5–35.7)
MCV: 105 fL — ABNORMAL HIGH (ref 79–97)
Monocytes Absolute: 1 10*3/uL — ABNORMAL HIGH (ref 0.1–0.9)
Monocytes: 9 %
NRBC: 3 % — ABNORMAL HIGH (ref 0–0)
Neutrophils Absolute: 5.9 10*3/uL (ref 1.4–7.0)
Neutrophils: 53 %
Platelets: 428 10*3/uL (ref 150–450)
Potassium: 4.1 mmol/L (ref 3.5–5.2)
RBC: 2.43 x10E6/uL — CL (ref 3.77–5.28)
RDW: 20 % — ABNORMAL HIGH (ref 11.7–15.4)
Retic Ct Pct: 12.4 % — ABNORMAL HIGH (ref 0.6–2.6)
Sodium: 140 mmol/L (ref 134–144)
Total Protein: 6.5 g/dL (ref 6.0–8.5)
Vit D, 25-Hydroxy: 12.2 ng/mL — ABNORMAL LOW (ref 30.0–100.0)
WBC: 10.8 10*3/uL (ref 3.4–10.8)

## 2018-08-23 LAB — HEMOGLOBINOPATHY EVALUATION
HGB C: 0 %
HGB S: 74.8 % — ABNORMAL HIGH
HGB VARIANT: 0 %
Hemoglobin A2 Quantitation: 4.7 % — ABNORMAL HIGH (ref 1.8–3.2)
Hemoglobin F Quantitation: 5.9 % — ABNORMAL HIGH (ref 0.0–2.0)
Hgb A: 14.6 % — ABNORMAL LOW (ref 96.4–98.8)

## 2018-08-23 LAB — VITAMIN B12: Vitamin B-12: 482 pg/mL (ref 232–1245)

## 2018-08-23 LAB — TSH: TSH: 3.87 u[IU]/mL (ref 0.450–4.500)

## 2018-08-30 ENCOUNTER — Telehealth: Payer: Self-pay | Admitting: Family Medicine

## 2018-08-30 NOTE — Telephone Encounter (Signed)
Several messages left on patient's voicemail to contact office to review results of recent labs.

## 2018-08-31 ENCOUNTER — Telehealth (HOSPITAL_COMMUNITY): Payer: Self-pay | Admitting: *Deleted

## 2018-08-31 NOTE — Telephone Encounter (Signed)
Patient called requesting to come to the day infusion hospital for sickle cell pain. Patient reports bilateral leg and hip pain rated 6/10. Reports last taking Percocet for pain at 5:00 am. COVID-19 screening done and patient denies all symptoms. Denies fever, chest pain, nausea, vomiting, diarrhea and abdominal pain. Admits to having transportation at discharge. [place], [place] notified. Patient can come to the day hospital for pain management. Patient advised and expresses an understanding.

## 2018-09-01 ENCOUNTER — Telehealth (HOSPITAL_COMMUNITY): Payer: Self-pay | Admitting: *Deleted

## 2018-09-01 ENCOUNTER — Non-Acute Institutional Stay (HOSPITAL_COMMUNITY)
Admission: AD | Admit: 2018-09-01 | Discharge: 2018-09-01 | Disposition: A | Discharge status: Home / Self Care | Payer: Medicaid Other | Source: Ambulatory Visit | Attending: Internal Medicine | Admitting: Internal Medicine

## 2018-09-01 ENCOUNTER — Encounter (HOSPITAL_COMMUNITY): Payer: Self-pay | Admitting: *Deleted

## 2018-09-01 DIAGNOSIS — D638 Anemia in other chronic diseases classified elsewhere: Secondary | ICD-10-CM | POA: Insufficient documentation

## 2018-09-01 DIAGNOSIS — Z79899 Other long term (current) drug therapy: Secondary | ICD-10-CM | POA: Insufficient documentation

## 2018-09-01 DIAGNOSIS — Z793 Long term (current) use of hormonal contraceptives: Secondary | ICD-10-CM | POA: Insufficient documentation

## 2018-09-01 DIAGNOSIS — Z765 Malingerer [conscious simulation]: Secondary | ICD-10-CM | POA: Insufficient documentation

## 2018-09-01 DIAGNOSIS — D5700 Hb-SS disease with crisis, unspecified: Secondary | ICD-10-CM | POA: Insufficient documentation

## 2018-09-01 DIAGNOSIS — G894 Chronic pain syndrome: Secondary | ICD-10-CM | POA: Insufficient documentation

## 2018-09-01 DIAGNOSIS — Z86718 Personal history of other venous thrombosis and embolism: Secondary | ICD-10-CM | POA: Insufficient documentation

## 2018-09-01 DIAGNOSIS — F17210 Nicotine dependence, cigarettes, uncomplicated: Secondary | ICD-10-CM | POA: Insufficient documentation

## 2018-09-01 DIAGNOSIS — Z7901 Long term (current) use of anticoagulants: Secondary | ICD-10-CM | POA: Diagnosis not present

## 2018-09-01 DIAGNOSIS — E876 Hypokalemia: Secondary | ICD-10-CM | POA: Diagnosis not present

## 2018-09-01 DIAGNOSIS — Z9114 Patient's other noncompliance with medication regimen: Secondary | ICD-10-CM | POA: Insufficient documentation

## 2018-09-01 LAB — CBC WITH DIFFERENTIAL/PLATELET
Abs Immature Granulocytes: 0.04 10*3/uL (ref 0.00–0.07)
Basophils Absolute: 0.1 10*3/uL (ref 0.0–0.1)
Basophils Relative: 1 %
Eosinophils Absolute: 0.1 10*3/uL (ref 0.0–0.5)
Eosinophils Relative: 1 %
HCT: 24 % — ABNORMAL LOW (ref 36.0–46.0)
Hemoglobin: 8.2 g/dL — ABNORMAL LOW (ref 12.0–15.0)
Immature Granulocytes: 0 %
Lymphocytes Relative: 42 %
Lymphs Abs: 4.9 10*3/uL — ABNORMAL HIGH (ref 0.7–4.0)
MCH: 35.2 pg — ABNORMAL HIGH (ref 26.0–34.0)
MCHC: 34.2 g/dL (ref 30.0–36.0)
MCV: 103 fL — ABNORMAL HIGH (ref 80.0–100.0)
Monocytes Absolute: 0.9 10*3/uL (ref 0.1–1.0)
Monocytes Relative: 7 %
Neutro Abs: 5.8 10*3/uL (ref 1.7–7.7)
Neutrophils Relative %: 49 %
Platelets: 331 10*3/uL (ref 150–400)
RBC: 2.33 MIL/uL — ABNORMAL LOW (ref 3.87–5.11)
RDW: 20 % — ABNORMAL HIGH (ref 11.5–15.5)
WBC: 11.8 10*3/uL — ABNORMAL HIGH (ref 4.0–10.5)
nRBC: 1.4 % — ABNORMAL HIGH (ref 0.0–0.2)

## 2018-09-01 LAB — COMPREHENSIVE METABOLIC PANEL
ALT: 28 U/L (ref 0–44)
AST: 55 U/L — ABNORMAL HIGH (ref 15–41)
Albumin: 4.8 g/dL (ref 3.5–5.0)
Alkaline Phosphatase: 162 U/L — ABNORMAL HIGH (ref 38–126)
Anion gap: 9 (ref 5–15)
BUN: 6 mg/dL (ref 6–20)
CO2: 21 mmol/L — ABNORMAL LOW (ref 22–32)
Calcium: 9.5 mg/dL (ref 8.9–10.3)
Chloride: 108 mmol/L (ref 98–111)
Creatinine, Ser: 0.53 mg/dL (ref 0.44–1.00)
GFR calc Af Amer: >60 mL/min (ref >60)
GFR calc non Af Amer: >60 mL/min (ref >60)
Glucose, Bld: 99 mg/dL (ref 70–99)
Potassium: 2.8 mmol/L — ABNORMAL LOW (ref 3.5–5.1)
Sodium: 138 mmol/L (ref 135–145)
Total Bilirubin: 2.6 mg/dL — ABNORMAL HIGH (ref 0.3–1.2)
Total Protein: 8.5 g/dL — ABNORMAL HIGH (ref 6.5–8.1)

## 2018-09-01 LAB — RAPID URINE DRUG SCREEN, HOSP PERFORMED
Amphetamines: NOT DETECTED
Barbiturates: NOT DETECTED
Benzodiazepines: NOT DETECTED
Cocaine: NOT DETECTED
Opiates: POSITIVE — AB
Tetrahydrocannabinol: NOT DETECTED

## 2018-09-01 LAB — RETICULOCYTES
Immature Retic Fract: 32.8 % — ABNORMAL HIGH (ref 2.3–15.9)
RBC.: 2.33 MIL/uL — ABNORMAL LOW (ref 3.87–5.11)
Retic Count, Absolute: 285.9 10*3/uL — ABNORMAL HIGH (ref 19.0–186.0)
Retic Ct Pct: 12.3 % — ABNORMAL HIGH (ref 0.4–3.1)

## 2018-09-01 LAB — PREGNANCY, URINE: Preg Test, Ur: NEGATIVE

## 2018-09-01 MED ORDER — DIPHENHYDRAMINE HCL 25 MG PO CAPS: 25.0000 mg | ORAL_CAPSULE | ORAL | Status: DC | PRN

## 2018-09-01 MED ORDER — POTASSIUM CHLORIDE CRYS ER 20 MEQ PO TBCR: 20.0000 meq | EXTENDED_RELEASE_TABLET | Freq: Once | ORAL | 0 refills | Status: DC

## 2018-09-01 MED ORDER — HYDROMORPHONE HCL 2 MG/ML IJ SOLN
2.0000 mg | Freq: Once | INTRAMUSCULAR | Status: AC
  Administered 2018-09-01: 2 mg via SUBCUTANEOUS
  Filled 2018-09-01: qty 1

## 2018-09-01 MED ORDER — OXYCODONE HCL 5 MG PO TABS: 20.0000 mg | ORAL_TABLET | Freq: Once | ORAL | Status: DC

## 2018-09-01 MED ORDER — POTASSIUM CHLORIDE CRYS ER 20 MEQ PO TBCR: 20.0000 meq | EXTENDED_RELEASE_TABLET | Freq: Once | ORAL | Status: DC

## 2018-09-01 MED ORDER — PROMETHAZINE HCL 25 MG PO TABS: 12.5000 mg | ORAL_TABLET | ORAL | Status: DC | PRN

## 2018-09-01 MED ORDER — ACETAMINOPHEN 500 MG PO TABS: 1000.0000 mg | ORAL_TABLET | Freq: Once | ORAL | Status: DC

## 2018-09-01 MED ORDER — HYDROMORPHONE HCL 2 MG/ML IJ SOLN
2.0000 mg | Freq: Once | INTRAMUSCULAR | Status: AC
  Administered 2018-09-01: 12:00:00 2 mg via SUBCUTANEOUS
  Filled 2018-09-01: qty 1

## 2018-09-01 NOTE — Discharge Instructions (Signed)
Your potassium is low at 2.8.  Sent potassium supplement to pharmacy, you will take 20 mEq daily and follow-up with your primary provider on Monday to repeat BMP.  Also recommend potassium rich diet that includes green leafy vegetables, bananas, apricots etc.  Continue to hydrate with 64 ounces of fluid  Sickle Cell Anemia, Adult  Sickle cell anemia is a condition where your red blood cells are shaped like sickles. Red blood cells carry oxygen through the body. Sickle-shaped cells do not live as long as normal red blood cells. They also clump together and block blood from flowing through the blood vessels. This prevents the body from getting enough oxygen. Sickle cell anemia causes organ damage and pain. It also increases the risk of infection. Follow these instructions at home: Medicines  Take over-the-counter and prescription medicines only as told by your doctor.  If you were prescribed an antibiotic medicine, take it as told by your doctor. Do not stop taking the antibiotic even if you start to feel better.  If you develop a fever, do not take medicines to lower the fever right away. Tell your doctor about the fever. Managing pain, stiffness, and swelling  Try these methods to help with pain: ? Use a heating pad. ? Take a warm bath. ? Distract yourself, such as by watching TV. Eating and drinking  Drink enough fluid to keep your pee (urine) clear or pale yellow. Drink more in hot weather and during exercise.  Limit or avoid alcohol.  Eat a healthy diet. Eat plenty of fruits, vegetables, whole grains, and lean protein.  Take vitamins and supplements as told by your doctor. Traveling  When traveling, keep these with you: ? Your medical information. ? The names of your doctors. ? Your medicines.  If you need to take an airplane, talk to your doctor first. Activity  Rest often.  Avoid exercises that make your heart beat much faster, such as jogging. General  instructions  Do not use products that have nicotine or tobacco, such as cigarettes and e-cigarettes. If you need help quitting, ask your doctor.  Consider wearing a medical alert bracelet.  Avoid being in high places (high altitudes), such as mountains.  Avoid very hot or cold temperatures.  Avoid places where the temperature changes a lot.  Keep all follow-up visits as told by your doctor. This is important. Contact a doctor if:  A joint hurts.  Your feet or hands hurt or swell.  You feel tired (fatigued). Get help right away if:  You have symptoms of infection. These include: ? Fever. ? Chills. ? Being very tired. ? Irritability. ? Poor eating. ? Throwing up (vomiting).  You feel dizzy or faint.  You have new stomach pain, especially on the left side.  You have a an erection (priapism) that lasts more than 4 hours.  You have numbness in your arms or legs.  You have a hard time moving your arms or legs.  You have trouble talking.  You have pain that does not go away when you take medicine.  You are short of breath.  You are breathing fast.  You have a long-term cough.  You have pain in your chest.  You have a bad headache.  You have a stiff neck.  Your stomach looks bloated even though you did not eat much.  Your skin is pale.  You suddenly cannot see well. Summary  Sickle cell anemia is a condition where your red blood cells are shaped like  sickles.  Follow your doctor's advice on ways to manage pain, food to eat, activities to do, and steps to take for safe travel.  Get medical help right away if you have any signs of infection, such as a fever. This information is not intended to replace advice given to you by your health care provider. Make sure you discuss any questions you have with your health care provider. Document Released: 12/07/2012 Document Revised: 06/10/2018 Document Reviewed: 03/24/2016 Elsevier Patient Education  2020 [employer]

## 2018-09-01 NOTE — BH Specialist Note (Signed)
Reason for Referral: Brittany Foster is a 33 y.o. female  Pt was referred by self for: Medicaid card issues  Pt reports the following concerns: difficulty reaching her Medicaid case worker  Plan: 1. Addressed today: Called Medicaid case worker together with patient and confirmed [facility] listed correctly on her Medicaid card. Patient recently re-established primary care at the [facility]. Also updated patient's address with Medicaid worker for mailing of new Medicaid card in August.  Patient currently living with a friend and would like to have her own stable housing. Referred patient to [employer].  3. Follow up: CSW also following patient for frequent ED visits  Estanislado Emms, [facility] [phone]

## 2018-09-01 NOTE — Telephone Encounter (Signed)
Patient called requesting to come to the day hospital for sickle cell pain. Patient reports bilateral hip and leg pain rated 7/10. Reports last taking Oxycodone at 4:00 am. COVID-19 screening done and negative. Patient denies fever, chest pain, nausea, vomiting, diarrhea and abdominal pain. Admits to having transportation at discharge without driving self. [place], [place] notified. Patient can come to the day hospital for pain management. Patient advised and expresses an understanding.

## 2018-09-01 NOTE — H&P (Addendum)
[facility] History and Physical   Date: 09/01/2018  Patient name: Brittany Foster Medical record number: [id number] Date of birth: 04/14/1985 Age: 33 y.o. Gender: female PCP: Kallie LocksStroud, Natalie M, FNP  Attending physician: Quentin AngstJegede, Olugbemiga E, MD  Chief Complaint: Sickle cell pain  History of Present Illness: Brittany Foster Driver, a 33 year old female with a medical history significant for sickle cell disease, chronic pain syndrome, drug-seeking behavior, history of anemia of chronic disease, history of DVT on Xarelto, and history of anemia of chronic disease presents complaining of generalized pain that is consistent with her typical sickle cell crisis.  Patient states that pain intensity increased overnight and is currently 7/10.  She says the pain is primarily to low back and lower extremities.  She last had Percocet 10-3 25 this a.m. without sustained relief.  She characterizes pain as constant and throbbing.  Patient has frequent ER and hospital admissions. Patient denies fever, chills, sore throat, persistent cough, shortness of breath, or chest pains.  Patient denies recent travel, sick contacts, or exposure to COVID-19.  She also denies dizziness, paresthesias, abdominal pain, dysuria, nausea, vomiting, or diarrhea.  Meds: Medications Prior to Admission  Medication Sig Dispense Refill Last Dose  . Deferasirox (JADENU) 360 MG TABS Take 2 tablets (720 mg total) by mouth daily after breakfast. 60 tablet 0   . folic acid (FOLVITE) 1 MG tablet Take 1 tablet (1 mg total) by mouth daily. 90 tablet 3   . hydroxyurea (HYDREA) 500 MG capsule Take 1 capsule (500 mg total) by mouth daily. 30 capsule 3   . medroxyPROGESTERone (DEPO-PROVERA) 150 MG/ML injection Inject 150 mg into the muscle every 3 (three) months.      Marland Kitchen. oxyCODONE-acetaminophen (PERCOCET) 10-325 MG tablet Take 1 tablet by mouth every 4 (four) hours as needed for pain. 24 tablet 0   . Rivaroxaban 15 & 20 MG TBPK Take as directed  on package: Start with one 15mg  tablet by mouth twice a day with food. On Day 22, switch to one 20mg  tablet once a day with food. 51 each 0   . Topiramate ER (TROKENDI XR) 100 MG CP24 Take 100 mg by mouth at bedtime. 30 capsule 3   . Vitamin D, Ergocalciferol, (DRISDOL) 1.25 MG (50000 UT) CAPS capsule Take 1 capsule (50,000 Units total) by mouth every 7 (seven) days. 5 capsule 3     Allergies: Ultram [tramadol], Zofran [ondansetron hcl], Buprenorphine hcl, Fentanyl, Morphine and related, and Tape Past Medical History:  Diagnosis Date  . Anemia   . Depression, major, recurrent (HCC)   . Migraines   . Sickle cell anemia (HCC)    Past Surgical History:  Procedure Laterality Date  . CESAREAN SECTION    . CHOLECYSTECTOMY  2000  . IR GENERIC HISTORICAL  10/08/2015   IR US GUIDE VASC ACCESS RIGHT 10/08/2015 Simonne ComeJohn Watts, MD [facility]  . IR GENERIC HISTORICAL  10/08/2015   IR FLUORO GUIDE CV LINE RIGHT 10/08/2015 Simonne ComeJohn Watts, MD [facility]  . IR REMOVAL TUN CV CATH W/O FL  03/09/2017  . MULTIPLE TOOTH EXTRACTIONS N/A   . port a cath placement Right    about 6-7 years ago  . removal of porta cath Right 09/11/15  . TUBAL LIGATION     Family History  Problem Relation Age of Onset  . Sickle cell trait Father   . Sickle cell trait Mother   . Sickle cell anemia Other    Social History   Socioeconomic History  .  Marital status: Single    Spouse name: Not on file  . Number of children: Not on file  . Years of education: Not on file  . Highest education level: Not on file  Occupational History  . Occupation: None  Social Needs  . Financial resource strain: Not on file  . Food insecurity    Worry: Not on file    Inability: Not on file  . Transportation needs    Medical: Not on file    Non-medical: Not on file  Tobacco Use  . Smoking status: Current Some Day Smoker    Packs/day: 1.00  . Smokeless tobacco: Current User  Substance and Sexual Activity  . Alcohol use: No     Alcohol/week: 0.0 standard drinks  . Drug use: No  . Sexual activity: Yes    Partners: Male    Birth control/protection: Injection    Comment: 1 new partner recently, no concern about STI, uses condoms  Lifestyle  . Physical activity    Days per week: Not on file    Minutes per session: Not on file  . Stress: Not on file  Relationships  . Social Musicianconnections    Talks on phone: Not on file    Gets together: Not on file    Attends religious service: Not on file    Active member of club or organization: Not on file    Attends meetings of clubs or organizations: Not on file    Relationship status: Not on file  . Intimate partner violence    Fear of current or ex partner: Not on file    Emotionally abused: Not on file    Physically abused: Not on file    Forced sexual activity: Not on file  Other Topics Concern  . Not on file  Social History Narrative   34 year old daughter    Review of Systems:  Review of Systems  Constitutional: Negative for chills and fever.  HENT: Negative.   Eyes: Negative.   Respiratory: Negative for cough and hemoptysis.   Cardiovascular: Negative.   Gastrointestinal: Negative.   Genitourinary: Negative.   Musculoskeletal: Positive for back pain and joint pain.  Skin: Negative.   Neurological: Negative.   Psychiatric/Behavioral: Negative.     Physical Exam: Last menstrual period 08/03/2018. Physical Exam Constitutional:      Appearance: Normal appearance.  HENT:     Head: Normocephalic.     Mouth/Throat:     Mouth: Mucous membranes are moist.     Pharynx: Oropharynx is clear.  Eyes:     Pupils: Pupils are equal, round, and reactive to light.  Neck:     Musculoskeletal: Normal range of motion.  Cardiovascular:     Rate and Rhythm: Normal rate and regular rhythm.     Pulses: Normal pulses.  Pulmonary:     Effort: Pulmonary effort is normal.     Breath sounds: Normal breath sounds.  Abdominal:     General: Bowel sounds are normal.   Musculoskeletal: Normal range of motion.  Skin:    General: Skin is warm.  Neurological:     General: No focal deficit present.     Mental Status: She is alert. Mental status is at baseline.  Psychiatric:        Mood and Affect: Mood normal.        Behavior: Behavior normal.        Thought Content: Thought content normal.        Judgment: Judgment normal.  Lab results: No results found for this or any previous visit (from the past 24 hour(s)).  Imaging results:  No results found.   Assessment & Plan Patient is highly opiate tolerant, she typically takes Percocet 10-3 25 at home.  Pain persists despite taking medication consistently.  Patient continues to have poor venous access. Care plan initiated: Dilaudid 2 mg SQ x3 doses Oxycodone 20 mg by mouth x1 Tylenol 1000 mg by mouth x1 Ibuprofen 800 mg by mouth x1 Patient will be reevaluated for pain intensity in the context of functioning and relationship to baseline as her care progresses If pain intensity remains elevated, will transition to inpatient services for higher level of care. Hydrate with 64 ounces of fluids Review CBC with differential, CMP, UDS, Upreg, and reticulocytes as results become available.   Patient does not agree with plan of care. She says that only IV pain medications are effective in relieving her pain. Explained to patient that her care plan does not consist of IV medications. Also, its the same medication, but different route. The patient expressed her frustration with pain regimen.    Donia Pounds  APRN, MSN, FNP-C Patient [facility] [address] [place], [place] [address] [phone] 09/01/2018, 11:32 AM

## 2018-09-01 NOTE — Progress Notes (Signed)
Patient admitted to the day hospital for treatment of sickle cell pain crisis. Patient reported legs pain rated 7/10. Patient given sub Q Dilaudid. Patient refused PO Tylenol, PO Oxycodone and PO Potassium. Stating "my potassium is always going to be low and nothing is going to change". At discharge, patient reported  pain at 7/10. Discharge instructions given to patient but patient refused it too. Patient alert, oriented and ambulatory at discharge.

## 2018-09-02 NOTE — Discharge Summary (Signed)
[facility] Discharge Summary   Patient ID: Brittany Foster MRN: [id number] DOB/AGE: 34/03/1985 34 y.o.  Admit date: 09/01/2018 Discharge date: 09/02/2018  Primary Care Physician:  Kallie LocksStroud, Natalie M, FNP  Admission Diagnoses:  Active Problems:   Hypokalemia   Sickle cell pain crisis [facility](HCC)   Discharge Medications:  Allergies as of 09/01/2018      Reactions   Ultram [tramadol] Other (See Comments)   Reaction:  Seizures    Zofran [ondansetron Hcl] Nausea And Vomiting   Buprenorphine Hcl Hives, Other (See Comments)   Reaction:  Shaking    Fentanyl Hives   Morphine And Related Hives, Other (See Comments)   Reaction:  Shaking    Tape Rash      Medication List    TAKE these medications   Deferasirox 360 MG Tabs Commonly known as: Jadenu Take 2 tablets (720 mg total) by mouth daily after breakfast.   folic acid 1 MG tablet Commonly known as: FOLVITE Take 1 tablet (1 mg total) by mouth daily.   hydroxyurea 500 MG capsule Commonly known as: HYDREA Take 1 capsule (500 mg total) by mouth daily.   medroxyPROGESTERone 150 MG/ML injection Commonly known as: DEPO-PROVERA Inject 150 mg into the muscle every 3 (three) months.   oxyCODONE-acetaminophen 10-325 MG tablet Commonly known as: Percocet Take 1 tablet by mouth every 4 (four) hours as needed for pain.   potassium chloride SA 20 MEQ tablet Commonly known as: K-DUR Take 1 tablet (20 mEq total) by mouth once for 1 dose.   Rivaroxaban 15 & 20 MG Tbpk Take as directed on package: Start with one 15mg  tablet by mouth twice a day with food. On Day 22, switch to one 20mg  tablet once a day with food.   Topiramate ER 100 MG Cp24 Commonly known as: TROKENDI XR Take 100 mg by mouth at bedtime.   Vitamin D (Ergocalciferol) 1.25 MG (50000 UT) Caps capsule Commonly known as: DRISDOL Take 1 capsule (50,000 Units total) by mouth every 7 (seven) days.        Consults:  None  Significant Diagnostic Studies:  [place] Ekg  Site Rite  Result Date: 08/07/2018 If Site Rite image not attached, placement could not be confirmed due to current cardiac rhythm.  History of present illness:  Brittany CampbellMiranda Foster, a 33 year old female with a medical history significant for sickle cell disease, chronic pain syndrome, drug-seeking behavior, history of anemia of chronic disease, history of DVT on Xarelto, and history of anemia of chronic disease presents complaining of generalized pain that is consistent with her typical sickle cell crisis.  Patient states that pain intensity increased overnight and is currently 7/10.  She says the pain is primarily to low back and lower extremities.  She last had Percocet 10-3 25 this a.m. without sustained relief.  She characterizes pain as constant and throbbing.  Patient has frequent ER and hospital admissions. Patient denies fever, chills, sore throat, persistent cough, shortness of breath, or chest pains.  Patient denies recent travel, sick contacts, or exposure to COVID-19.  She also denies dizziness, paresthesias, abdominal pain, dysuria, nausea, vomiting, or diarrhea.  [facility] Course: Patient admitted to sickle cell day infusion center for management of pain crisis. All laboratory values reviewed. Hemoglobin 8.2, consistent with patient's baseline.  No clinical indication for blood transfusion on today.  WBCs 11.8, no signs of infection or inflammation.  Patient is afebrile.  Suspected to be reactive. Potassium 2.8.  Patient is on supplemental potassium at  home, she has been nonadherent to medication regimen.  Patient refused potassium, she states that her potassium is always low and she does not want to take medication.  Patient's care plan initiated for pain control. Dilaudid 2 mg SQ x4 doses Patient hydrated with 64 ounces of fluid Patient refused Tylenol 1000 mg by mouth Patient also refused oxycodone 20 mg, she stated that that particular medication does not work well for  her. Patient's pain intensity decreased minimally throughout admission.  Patient states that she typically warrants IV pain medications for pain crisis. Patient advised to follow-up with hematology for medication management. Resume all home medications. Patient is aware of all upcoming appointments. Patient alert, oriented, and ambulating without assistance.  Patient will discharge home in a hemodynamically stable condition.  Discharge instructions:  Resume all home medications.  Follow up with PCP as previously  scheduled.   Discussed the importance of drinking 64 ounces of water daily to  help prevent pain crises, it is important to drink plenty of water throughout the day. This is because dehydration of red blood cells may lead further sickling.   Avoid all stressors that precipitate sickle cell pain crisis.     The patient was given clear instructions to go to ER or return to medical center if symptoms do not improve, worsen or new problems develop.    Physical Exam at Discharge:  BP 110/73 (BP Location: Left Arm)   Pulse 85   Temp 99.1 F (37.3 C) (Oral)   Resp 16   LMP 08/03/2018   SpO2 100%  Physical Exam Constitutional:      Appearance: Normal appearance.  HENT:     Mouth/Throat:     Mouth: Mucous membranes are moist.  Eyes:     Pupils: Pupils are equal, round, and reactive to light.  Cardiovascular:     Rate and Rhythm: Normal rate and regular rhythm.     Pulses: Normal pulses.  Pulmonary:     Effort: Pulmonary effort is normal.  Abdominal:     General: Abdomen is flat. Bowel sounds are normal.     Palpations: Abdomen is soft.  Musculoskeletal: Normal range of motion.  Skin:    General: Skin is warm and dry.  Neurological:     General: No focal deficit present.     Mental Status: She is alert. Mental status is at baseline.  Psychiatric:        Mood and Affect: Mood normal.        Behavior: Behavior normal.        Thought Content: Thought content normal.         Judgment: Judgment normal.      Disposition at Discharge: Discharge disposition: 01-Home or Self Care       Discharge Orders: Discharge Instructions    Discharge patient   Complete by: As directed    Discharge disposition: 01-Home or Self Care   Discharge patient date: 09/01/2018      Condition at Discharge:   Stable  Time spent on Discharge:  Greater than 30 minutes.  Signed: Donia Pounds  APRN, MSN, FNP-C Patient [facility] [address] [place], [place] [address] [phone]  09/02/2018, 12:55 AM

## 2018-09-28 ENCOUNTER — Telehealth (HOSPITAL_COMMUNITY): Payer: Self-pay

## 2018-09-28 NOTE — Telephone Encounter (Signed)
Brittany Foster called [facility] with complaints of sickle cell pain to her back, legs and hips; rated pain 7/10. Patient stated last dose of Percocet 10/325 was at midnight. Patient denies COVID-19 symptoms or being around anyone with them. Also, denies fever, chest pain N/V/D and abdominal pain.  Reviewed chart with provider. Patient advise to continue to take pain medication ATC along with ibuprofen and to hydrate with 64 ounces of water.  Patient advise if pain continues in the morning to call back.  Patient hung up phone.

## 2018-10-19 ENCOUNTER — Ambulatory Visit: Payer: Self-pay | Admitting: Family Medicine

## 2018-10-21 ENCOUNTER — Telehealth (HOSPITAL_COMMUNITY): Payer: Self-pay | Admitting: *Deleted

## 2018-10-21 NOTE — Telephone Encounter (Signed)
Patient called requesting to come to the day hospital for pain management. Patient reports bilateral hip and leg pain rated 7/10. Reports last taking Oxycodone for pain yesterday but still has medication. COVID-19 screening done and negative. Patient denies fever, chest pain, nausea, vomiting, diarrhea and abdominal pain. [place], [place] notified and advised patient to take her pain medication around the clock as prescribed. Patient advised and hung up phone.

## 2018-11-14 ENCOUNTER — Telehealth (HOSPITAL_COMMUNITY): Payer: Self-pay | Admitting: General Practice

## 2018-11-14 ENCOUNTER — Telehealth: Payer: Self-pay | Admitting: Family Medicine

## 2018-11-14 ENCOUNTER — Non-Acute Institutional Stay (HOSPITAL_COMMUNITY)
Admission: AD | Admit: 2018-11-14 | Discharge: 2018-11-14 | Disposition: A | Discharge status: Home / Self Care | Payer: Medicaid Other | Source: Ambulatory Visit | Attending: Internal Medicine | Admitting: Internal Medicine

## 2018-11-14 ENCOUNTER — Telehealth: Payer: Self-pay

## 2018-11-14 DIAGNOSIS — Z79899 Other long term (current) drug therapy: Secondary | ICD-10-CM | POA: Diagnosis not present

## 2018-11-14 DIAGNOSIS — D638 Anemia in other chronic diseases classified elsewhere: Secondary | ICD-10-CM | POA: Diagnosis not present

## 2018-11-14 DIAGNOSIS — Z86711 Personal history of pulmonary embolism: Secondary | ICD-10-CM | POA: Diagnosis not present

## 2018-11-14 DIAGNOSIS — Z793 Long term (current) use of hormonal contraceptives: Secondary | ICD-10-CM | POA: Insufficient documentation

## 2018-11-14 DIAGNOSIS — D5700 Hb-SS disease with crisis, unspecified: Secondary | ICD-10-CM

## 2018-11-14 DIAGNOSIS — G894 Chronic pain syndrome: Secondary | ICD-10-CM | POA: Insufficient documentation

## 2018-11-14 DIAGNOSIS — Z7901 Long term (current) use of anticoagulants: Secondary | ICD-10-CM | POA: Insufficient documentation

## 2018-11-14 DIAGNOSIS — F17210 Nicotine dependence, cigarettes, uncomplicated: Secondary | ICD-10-CM | POA: Insufficient documentation

## 2018-11-14 DIAGNOSIS — Z86718 Personal history of other venous thrombosis and embolism: Secondary | ICD-10-CM | POA: Diagnosis not present

## 2018-11-14 DIAGNOSIS — F1120 Opioid dependence, uncomplicated: Secondary | ICD-10-CM | POA: Insufficient documentation

## 2018-11-14 LAB — RETICULOCYTES
Immature Retic Fract: 31.8 % — ABNORMAL HIGH (ref 2.3–15.9)
RBC.: 2.34 MIL/uL — ABNORMAL LOW (ref 3.87–5.11)
Retic Count, Absolute: 0.5 10*3/uL — ABNORMAL LOW (ref 19.0–186.0)
Retic Ct Pct: 18.7 % — ABNORMAL HIGH (ref 0.4–3.1)

## 2018-11-14 LAB — CBC WITH DIFFERENTIAL/PLATELET
Abs Immature Granulocytes: 0.07 10*3/uL (ref 0.00–0.07)
Basophils Absolute: 0.1 10*3/uL (ref 0.0–0.1)
Basophils Relative: 0 %
Eosinophils Absolute: 0 10*3/uL (ref 0.0–0.5)
Eosinophils Relative: 0 %
HCT: 24.3 % — ABNORMAL LOW (ref 36.0–46.0)
Hemoglobin: 8.3 g/dL — ABNORMAL LOW (ref 12.0–15.0)
Immature Granulocytes: 1 %
Lymphocytes Relative: 24 %
Lymphs Abs: 3 10*3/uL (ref 0.7–4.0)
MCH: 35.5 pg — ABNORMAL HIGH (ref 26.0–34.0)
MCHC: 34.2 g/dL (ref 30.0–36.0)
MCV: 103.8 fL — ABNORMAL HIGH (ref 80.0–100.0)
Monocytes Absolute: 1.1 10*3/uL — ABNORMAL HIGH (ref 0.1–1.0)
Monocytes Relative: 9 %
Neutro Abs: 8.1 10*3/uL — ABNORMAL HIGH (ref 1.7–7.7)
Neutrophils Relative %: 66 %
Platelets: 311 10*3/uL (ref 150–400)
RBC: 2.34 MIL/uL — ABNORMAL LOW (ref 3.87–5.11)
RDW: 21.9 % — ABNORMAL HIGH (ref 11.5–15.5)
WBC: 12.4 10*3/uL — ABNORMAL HIGH (ref 4.0–10.5)
nRBC: 3.5 % — ABNORMAL HIGH (ref 0.0–0.2)

## 2018-11-14 LAB — COMPREHENSIVE METABOLIC PANEL
ALT: 34 U/L (ref 0–44)
AST: 59 U/L — ABNORMAL HIGH (ref 15–41)
Albumin: 3.9 g/dL (ref 3.5–5.0)
Alkaline Phosphatase: 199 U/L — ABNORMAL HIGH (ref 38–126)
Anion gap: 6 (ref 5–15)
BUN: 8 mg/dL (ref 6–20)
CO2: 22 mmol/L (ref 22–32)
Calcium: 8.9 mg/dL (ref 8.9–10.3)
Chloride: 113 mmol/L — ABNORMAL HIGH (ref 98–111)
Creatinine, Ser: 0.38 mg/dL — ABNORMAL LOW (ref 0.44–1.00)
GFR calc Af Amer: >60 mL/min (ref >60)
GFR calc non Af Amer: >60 mL/min (ref >60)
Glucose, Bld: 118 mg/dL — ABNORMAL HIGH (ref 70–99)
Potassium: 3.2 mmol/L — ABNORMAL LOW (ref 3.5–5.1)
Sodium: 141 mmol/L (ref 135–145)
Total Bilirubin: 2.9 mg/dL — ABNORMAL HIGH (ref 0.3–1.2)
Total Protein: 7.4 g/dL (ref 6.5–8.1)

## 2018-11-14 LAB — PREGNANCY, URINE: Preg Test, Ur: NEGATIVE

## 2018-11-14 MED ORDER — ACETAMINOPHEN 500 MG PO TABS
1000.0000 mg | ORAL_TABLET | Freq: Once | ORAL | Status: DC
  Filled 2018-11-14: qty 2

## 2018-11-14 MED ORDER — KETOROLAC TROMETHAMINE 30 MG/ML IJ SOLN
15.0000 mg | Freq: Once | INTRAMUSCULAR | Status: DC
  Filled 2018-11-14: qty 1

## 2018-11-14 MED ORDER — HYDROMORPHONE HCL 2 MG/ML IJ SOLN
2.0000 mg | Freq: Once | INTRAMUSCULAR | Status: AC
  Administered 2018-11-14: 2 mg via INTRAVENOUS
  Filled 2018-11-14: qty 1

## 2018-11-14 MED ORDER — HEPARIN SOD (PORK) LOCK FLUSH 100 UNIT/ML IV SOLN
500.0000 [IU] | INTRAVENOUS | Status: DC | PRN
  Administered 2018-11-14: 13:00:00 500 [IU]
  Filled 2018-11-14: qty 5

## 2018-11-14 MED ORDER — SODIUM CHLORIDE 0.45 % IV SOLN
INTRAVENOUS | Status: DC
  Administered 2018-11-14: 11:00:00 via INTRAVENOUS

## 2018-11-14 MED ORDER — HYDROMORPHONE HCL 2 MG/ML IJ SOLN
2.0000 mg | Freq: Once | INTRAMUSCULAR | Status: AC
  Administered 2018-11-14: 2 mg via SUBCUTANEOUS
  Filled 2018-11-14: qty 1

## 2018-11-14 NOTE — Telephone Encounter (Signed)
Attempted to contact patient to answer question. Message left on voicemail.

## 2018-11-14 NOTE — Discharge Summary (Signed)
[facility] Discharge Summary   Patient ID: Brittany Foster MRN: [id number] DOB/AGE: [birth year] 33 y.o.  Admit date: 11/14/2018 Discharge date: 11/14/2018  Primary Care Physician:  Azzie Glatter, FNP  Admission Diagnoses:  Active Problems:   Sickle cell pain crisis [facility])   Discharge Medications:  Allergies as of 11/14/2018      Reactions   Ultram [tramadol] Other (See Comments)   Reaction:  Seizures    Zofran [ondansetron Hcl] Nausea And Vomiting   Buprenorphine Hcl Hives, Other (See Comments)   Reaction:  Shaking    Fentanyl Hives   Morphine And Related Hives, Other (See Comments)   Reaction:  Shaking    Tape Rash      Medication List    TAKE these medications   Deferasirox 360 MG Tabs Commonly known as: Jadenu Take 2 tablets (720 mg total) by mouth daily after breakfast.   folic acid 1 MG tablet Commonly known as: FOLVITE Take 1 tablet (1 mg total) by mouth daily.   hydroxyurea 500 MG capsule Commonly known as: HYDREA Take 1 capsule (500 mg total) by mouth daily.   medroxyPROGESTERone 150 MG/ML injection Commonly known as: DEPO-PROVERA Inject 150 mg into the muscle every 3 (three) months.   oxyCODONE-acetaminophen 10-325 MG tablet Commonly known as: Percocet Take 1 tablet by mouth every 4 (four) hours as needed for pain.   potassium chloride SA 20 MEQ tablet Commonly known as: K-DUR Take 1 tablet (20 mEq total) by mouth once for 1 dose.   Rivaroxaban 15 & 20 MG Tbpk Take as directed on package: Start with one 15mg  tablet by mouth twice a day with food. On Day 22, switch to one 20mg  tablet once a day with food.   Topiramate ER 100 MG Cp24 Commonly known as: TROKENDI XR Take 100 mg by mouth at bedtime.   Vitamin D (Ergocalciferol) 1.25 MG (50000 UT) Caps capsule Commonly known as: DRISDOL Take 1 capsule (50,000 Units total) by mouth every 7 (seven) days.        Consults:  None  Significant Diagnostic Studies:  No results  found.  History of present illness: Brittany Foster, a 33 year old female with a medical history significant for sickle cell disease, chronic pain syndrome, opiate dependence, opiate tolerance, drug-seeking behavior, history of PE/DVT on Xarelto, and history of anemia of chronic disease presents complaining of generalized pain that is consistent with her typical pain crisis.  Patient states that she was in her usual state of health until yesterday.  She states that pain came on suddenly primarily to bilateral hips, low back, and lower extremities.  She has not identified any provocative factors.  She says that she has been taking Percocet 10-325 mg consistently without resolution of pain.  She last had Percocet this a.m.  Her pain is characterized as 7/10, constant, and aching. Kaydince's pain medications are managed by [facility] pain management.  Patient last had a telephone visit 1 week ago and she stated that a long-acting medication was added to her medication regimen.  Patient takes 8 mg daily.  She denies headache, chest pain, shortness of breath, dysuria, nausea, vomiting, or diarrhea.  She also denies recent travel, sick contacts or exposure to COVID-19. [facility] Course: Patient admitted today infusion center for management of pain crisis.  All laboratory values were reviewed. WBCs 12.4, no signs of infection or inflammation.  Patient remained afebrile. Hemoglobin 8.3, consistent with patient's baseline.  No clinical indication for blood transfusion. Potassium 3.2,  patient typically takes K. Dur at home, she did not take medication prior to arrival.  Patient encouraged to continue home medications. Initiated IV fluids, 0.45% saline at 100 mL/h Toradol 15 mg IV x1 Patient refused Tylenol. Dilaudid 2 mg IV x1 dose Dilaudid 2 mg SQ x2 doses Patient requested discharge, she states that she now has a port and should be getting IV medications for pain control. She was angry that treatment  regimen consisted of SQ medications.  When discussing care plan, patient was not interested.  Her pain intensity 7/10. Advised to follow up with pain management as scheduled.   Patient alert, oriented, and ambulating without assistance. She was discharged home in a hemodynamically stable condition.   Discharge instructions:  Resume all home medications.  Follow up with PCP as previously  scheduled.   Discussed the importance of drinking 64 ounces of water daily to  help prevent pain crises, it is important to drink plenty of water throughout the day. This is because dehydration of red blood cells may lead further sickling.   Avoid all stressors that precipitate sickle cell pain crisis.     The patient was given clear instructions to go to ER or return to medical center if symptoms do not improve, worsen or new problems develop.     Physical Exam at Discharge:  BP 117/72 (BP Location: Right Arm)   Pulse 90   Temp 98.4 F (36.9 C) (Oral)   Resp 16   SpO2 98%   Physical Exam Eyes:     Pupils: Pupils are equal, round, and reactive to light.  Cardiovascular:     Rate and Rhythm: Normal rate and regular rhythm.     Heart sounds: Normal heart sounds.  Pulmonary:     Effort: Pulmonary effort is normal.     Breath sounds: Normal breath sounds.  Abdominal:     General: Bowel sounds are normal.  Musculoskeletal: Normal range of motion.      Disposition at Discharge: Discharge disposition: 01-Home or Self Care       Discharge Orders:   Condition at Discharge:   Stable  Time spent on Discharge:  Greater than 30 minutes.  Signed: .Nolon NationsLachina Moore [name]  APRN, MSN, FNP-C [facility] [address]  [place], [place] [address] [phone]  11/14/2018, 3:01 PM

## 2018-11-14 NOTE — Telephone Encounter (Signed)
Patient called, complained of pain in the back, legs and hips    rated at 7/10. Denied chest pain, fever, diarrhea, abdominal pain, nausea/vomitting. Screened negative for Covid-19 symptoms. Admitted to having means of transportation without driving self after treatment. Last took 1 tablet of 10-325 mg of Percocet. Per provider, patient can come to the day hospital for treatment. Patient notified, verbalized understanding.

## 2018-11-14 NOTE — Progress Notes (Signed)
Patient admitted to the day hospital for sickle cell pain. Initially, patient reported bilateral hip, leg and back pain rated 7/10. For pain management, patient given  2 mg IV Dilaudid, 4 mg sub-q Dilaudid and hydrated with IV fluids. Patient refused Tylenol and Toradol. Patient upset about her care plan and  requested to be discharged. At discharge, patient still rated pain at 7/10. PAC deaccessed. Vital signs stable. Patient refused AVS. Alert, oriented and ambulatory at discharge.

## 2018-11-14 NOTE — Telephone Encounter (Signed)
Sent to NP and she handle it

## 2018-11-14 NOTE — H&P (Addendum)
[facility] History and Physical   Date: 11/14/2018  Patient name: Brittany Foster Medical record number: [id number] Date of birth: 17-Sep-1985 Age: 33 y.o. Gender: female PCP: Azzie Glatter, FNP  Attending physician: Tresa Garter, MD  Chief Complaint: Sickle cell pain  History of Present Illness: Brittany Foster, a 33 year old female with a medical history significant for sickle cell disease, chronic pain syndrome, opiate dependence, opiate tolerance, drug-seeking behavior, history of PE/DVT on Xarelto, and history of anemia of chronic disease presents complaining of generalized pain that is consistent with her typical pain crisis.  Patient states that she was in her usual state of health until yesterday.  She states that pain came on suddenly primarily to bilateral hips, low back, and lower extremities.  She has not identified any provocative factors.  She says that she has been taking Percocet 10-3 25 mg consistently without resolution.  She last had Percocet this a.m.  Her pain is characterized as 7/10, constant, and aching. Brittany Foster's pain medications are managed by [facility] pain management.  Patient last had a telephone visit 1 week ago and states that a long-acting medication was added to her regimen.  On review of providers notes and PDMP, Hydromorphone ER 8 mg daily was added. She denies headache, chest pain, shortness of breath, dysuria, nausea, vomiting, or diarrhea.  She also denies recent travel, sick contacts, or exposure to COVID-19.  Meds: Medications Prior to Admission  Medication Sig Dispense Refill Last Dose  . Deferasirox (JADENU) 360 MG TABS Take 2 tablets (720 mg total) by mouth daily after breakfast. 60 tablet 0   . folic acid (FOLVITE) 1 MG tablet Take 1 tablet (1 mg total) by mouth daily. 90 tablet 3   . hydroxyurea (HYDREA) 500 MG capsule Take 1 capsule (500 mg total) by mouth daily. 30 capsule 3   . medroxyPROGESTERone (DEPO-PROVERA) 150 MG/ML  injection Inject 150 mg into the muscle every 3 (three) months.      Marland Kitchen oxyCODONE-acetaminophen (PERCOCET) 10-325 MG tablet Take 1 tablet by mouth every 4 (four) hours as needed for pain. 24 tablet 0   . potassium chloride SA (K-DUR) 20 MEQ tablet Take 1 tablet (20 mEq total) by mouth once for 1 dose. 10 tablet 0   . Rivaroxaban 15 & 20 MG TBPK Take as directed on package: Start with one 15mg  tablet by mouth twice a day with food. On Day 22, switch to one 20mg  tablet once a day with food. 51 each 0   . Topiramate ER (TROKENDI XR) 100 MG CP24 Take 100 mg by mouth at bedtime. 30 capsule 3   . Vitamin D, Ergocalciferol, (DRISDOL) 1.25 MG (50000 UT) CAPS capsule Take 1 capsule (50,000 Units total) by mouth every 7 (seven) days. 5 capsule 3     Allergies: Ultram [tramadol], Zofran [ondansetron hcl], Buprenorphine hcl, Fentanyl, Morphine and related, and Tape Past Medical History:  Diagnosis Date  . Anemia   . Depression, major, recurrent ([place])   . Migraines   . Sickle cell anemia (HCC)    Past Surgical History:  Procedure Laterality Date  . CESAREAN SECTION    . CHOLECYSTECTOMY  2000  . IR GENERIC HISTORICAL  10/08/2015   IR US GUIDE VASC ACCESS RIGHT 10/08/2015 Sandi Mariscal, MD [facility]  . IR GENERIC HISTORICAL  10/08/2015   IR FLUORO GUIDE CV LINE RIGHT 10/08/2015 Sandi Mariscal, MD [facility]  . IR REMOVAL TUN CV CATH W/O FL  03/09/2017  .  MULTIPLE TOOTH EXTRACTIONS N/A   . port a cath placement Right    about 6-7 years ago  . removal of porta cath Right 09/11/15  . TUBAL LIGATION     Family History  Problem Relation Age of Onset  . Sickle cell trait Father   . Sickle cell trait Mother   . Sickle cell anemia Other    Social History   Socioeconomic History  . Marital status: Single    Spouse name: Not on file  . Number of children: Not on file  . Years of education: Not on file  . Highest education level: Not on file  Occupational History  . Occupation: None  Social Needs  .  Financial resource strain: Not on file  . Food insecurity    Worry: Not on file    Inability: Not on file  . Transportation needs    Medical: Not on file    Non-medical: Not on file  Tobacco Use  . Smoking status: Current Some Day Smoker    Packs/day: 1.00  . Smokeless tobacco: Current User  Substance and Sexual Activity  . Alcohol use: No    Alcohol/week: 0.0 standard drinks  . Drug use: No  . Sexual activity: Yes    Partners: Male    Birth control/protection: Injection    Comment: 1 new partner recently, no concern about STI, uses condoms  Lifestyle  . Physical activity    Days per week: Not on file    Minutes per session: Not on file  . Stress: Not on file  Relationships  . Social Musician on phone: Not on file    Gets together: Not on file    Attends religious service: Not on file    Active member of club or organization: Not on file    Attends meetings of clubs or organizations: Not on file    Relationship status: Not on file  . Intimate partner violence    Fear of current or ex partner: Not on file    Emotionally abused: Not on file    Physically abused: Not on file    Forced sexual activity: Not on file  Other Topics Concern  . Not on file  Social History Narrative   55 year old daughter   Review of Systems  Constitutional: Negative for chills and fever.  HENT: Negative.   Eyes: Negative.  Negative for photophobia and pain.  Respiratory: Negative for cough, hemoptysis and shortness of breath.   Gastrointestinal: Negative.   Genitourinary: Negative.   Musculoskeletal: Positive for back pain and joint pain.  Skin: Negative.   Neurological: Negative.   Psychiatric/Behavioral: Negative.  Negative for depression. The patient is not nervous/anxious.     Physical Exam: There were no vitals taken for this visit. BP 117/72 (BP Location: Right Arm)   Pulse 90   Temp 98.4 F (36.9 C) (Oral)   Resp 16   SpO2 98%   General Appearance:    Alert,  cooperative, no distress, appears stated age  Head:    Normocephalic, without obvious abnormality, atraumatic  Eyes:    PERRL, conjunctiva/corneas clear, EOM's intact, fundi    benign, both eyes  Ears:    Normal TM's and external ear canals, both ears  Nose:   Nares normal, septum midline, mucosa normal, no drainage    or sinus tenderness  Throat:   Lips, mucosa, and tongue normal; teeth and gums normal  Neck:   Supple, symmetrical, trachea midline,  no adenopathy;    thyroid:  no enlargement/tenderness/nodules; no carotid   bruit or JVD  Back:     Symmetric, no curvature, ROM normal, no CVA tenderness  Lungs:     Clear to auscultation bilaterally, respirations unlabored  Chest Wall:    No tenderness or deformity   Heart:    Regular rate and rhythm, S1 and S2 normal, no murmur, rub   or gallop  Abdomen:     Soft, non-tender, bowel sounds active all four quadrants,    no masses, no organomegaly  Extremities:   Extremities normal, atraumatic, no cyanosis or edema  Pulses:   2+ and symmetric all extremities  Skin:   Skin color, texture, turgor normal, no rashes or lesions  Lymph nodes:   Cervical, supraclavicular, and axillary nodes normal  Neurologic:   CNII-XII intact, normal strength, sensation and reflexes    throughout     Lab results: No results found for this or any previous visit (from the past 24 hour(s)).  Imaging results:  No results found.   Assessment & Plan: Pain persists despite home medications.  Admit to sickle cell day infusion center for management of pain crisis.  Patient opiate tolerant. Dilaudid 2 mg IV x1 Oxycodone 15 mg by mouth x1 0.45% saline at 100 mL/h Toradol 15 mg IV x1 dose Tylenol 1000 mg by mouth x1 day Review CBC with differential, CMP, UDS, and reticulocytes Patient's baseline hemoglobin is 7.0-8.0 g/dL.  She is typically not transfused unless hemoglobin is less than 6.0. Patient's pain will be reevaluated in view of functionality and  relationship to her baseline as care progresses If pain intensity remains elevated, transition to inpatient services for higher level of care  Nolon NationsLachina Moore [name]  APRN, MSN, FNP-C Patient Care [facility] [address]  [place], [place] [id number] [phone]  11/14/2018, 10:37 AM

## 2018-11-18 ENCOUNTER — Ambulatory Visit: Payer: Self-pay | Admitting: Family Medicine

## 2018-12-16 ENCOUNTER — Other Ambulatory Visit: Payer: Self-pay | Admitting: Family Medicine

## 2018-12-16 ENCOUNTER — Telehealth: Payer: Self-pay

## 2018-12-16 NOTE — Telephone Encounter (Signed)
Brittany Foster,  Patient called and is requesting that you call her back. She says she has questions about her care. She is currently in the ER and says she needs to talk to you while she is there. Can you please call her? Thank you!

## 2019-02-20 ENCOUNTER — Ambulatory Visit (INDEPENDENT_AMBULATORY_CARE_PROVIDER_SITE_OTHER): Payer: Medicaid Other | Admitting: Family Medicine

## 2019-02-20 ENCOUNTER — Ambulatory Visit (HOSPITAL_COMMUNITY)
Admission: RE | Admit: 2019-02-20 | Discharge: 2019-02-20 | Disposition: A | Discharge status: Home / Self Care | Payer: Medicaid Other | Source: Ambulatory Visit | Attending: Family Medicine | Admitting: Family Medicine

## 2019-02-20 ENCOUNTER — Other Ambulatory Visit: Payer: Self-pay

## 2019-02-20 ENCOUNTER — Encounter: Payer: Self-pay | Admitting: Family Medicine

## 2019-02-20 VITALS — BP 114/67 | HR 89 | Temp 98.4°F | Ht 60.0 in | Wt 128.6 lb

## 2019-02-20 DIAGNOSIS — Z09 Encounter for follow-up examination after completed treatment for conditions other than malignant neoplasm: Secondary | ICD-10-CM | POA: Diagnosis not present

## 2019-02-20 DIAGNOSIS — R0789 Other chest pain: Secondary | ICD-10-CM | POA: Insufficient documentation

## 2019-02-20 DIAGNOSIS — F1190 Opioid use, unspecified, uncomplicated: Secondary | ICD-10-CM

## 2019-02-20 DIAGNOSIS — Z3042 Encounter for surveillance of injectable contraceptive: Secondary | ICD-10-CM | POA: Diagnosis not present

## 2019-02-20 DIAGNOSIS — D571 Sickle-cell disease without crisis: Secondary | ICD-10-CM | POA: Diagnosis not present

## 2019-02-20 DIAGNOSIS — Z789 Other specified health status: Secondary | ICD-10-CM

## 2019-02-20 DIAGNOSIS — G4700 Insomnia, unspecified: Secondary | ICD-10-CM

## 2019-02-20 DIAGNOSIS — D5700 Hb-SS disease with crisis, unspecified: Secondary | ICD-10-CM

## 2019-02-20 LAB — POCT URINALYSIS DIPSTICK
Glucose, UA: NEGATIVE
Ketones, UA: NEGATIVE
Leukocytes, UA: NEGATIVE
Nitrite, UA: NEGATIVE
Protein, UA: POSITIVE — AB
Spec Grav, UA: 1.01 (ref 1.010–1.025)
Urobilinogen, UA: 1 E.U./dL
pH, UA: 5.5 (ref 5.0–8.0)

## 2019-02-20 LAB — POCT URINE PREGNANCY: Preg Test, Ur: NEGATIVE

## 2019-02-20 MED ORDER — HYDROXYUREA 500 MG PO CAPS: 500.0000 mg | ORAL_CAPSULE | Freq: Every day | ORAL | 3 refills | Status: AC

## 2019-02-20 MED ORDER — TRAZODONE HCL 50 MG PO TABS: 25.0000 mg | ORAL_TABLET | Freq: Every evening | ORAL | 6 refills | Status: AC | PRN

## 2019-02-20 MED ORDER — MEDROXYPROGESTERONE ACETATE 150 MG/ML IM SUSP
150.0000 mg | Freq: Once | INTRAMUSCULAR | Status: DC
  Administered 2019-02-20: 150 mg via INTRAMUSCULAR

## 2019-02-20 MED ORDER — FOLIC ACID 1 MG PO TABS: 1.0000 mg | ORAL_TABLET | Freq: Every day | ORAL | 3 refills | Status: AC

## 2019-02-20 MED ORDER — DEFERASIROX 360 MG PO TABS: 720.0000 mg | ORAL_TABLET | Freq: Every day | ORAL | 0 refills | Status: AC

## 2019-02-20 MED ORDER — RIVAROXABAN 20 MG PO TABS: 20.0000 mg | ORAL_TABLET | Freq: Every day | ORAL | 6 refills | Status: AC

## 2019-02-20 NOTE — Progress Notes (Signed)
[facility]  Hospital Follow Up   Subjective:  Patient ID: Brittany Foster, female    DOB: 1985-12-20  Age: 33 y.o. MRN: [id number]  CC:  Chief Complaint  Patient presents with  . Follow-up    medication refills  . Contraception    Depo injection    HPI Brittany Foster is a 33 year old female who presents for Hospital Follow Up today.   Past Medical History:  Diagnosis Date  . Anemia   . Depression, major, recurrent (HCC)   . Migraines   . Sickle cell anemia (HCC)    Current Status: Since her last office visit, she is doing well with no complaints. She states that she has pain in her hips. She rates her pain today at 4/10. She has not had a hospital visit for Sickle Cell Crisis since 02/02/2019 and also 01/15/2019 where she was treated and discharged a week late. She states that she having intermittent sharp shooting pains in right breast, which last 5-7 minutes,  X 1 week now. She denies chest trauma. She is currently taking all medications as prescribed and staying well hydrated. She reports occasional nausea, constipation, dizziness and headaches. She is currently following up with Pain Management at [facility], where she received Percocet refills. She denies fevers, chills, fatigue, recent infections, weight loss, and night sweats. She has not had any headaches, visual changes, dizziness, and falls. No chest pain, heart palpitations, cough and shortness of breath reported. No reports of GI problems such as nausea, vomiting, diarrhea, and constipation. She has no reports of blood in stools, dysuria and hematuria. No depression or anxiety is stable today. She denies suicidal ideations, homicidal ideations, or auditory hallucinations.  Past Surgical History:  Procedure Laterality Date  . CESAREAN SECTION    . CHOLECYSTECTOMY  2000  . IR GENERIC HISTORICAL  10/08/2015   IR US GUIDE VASC ACCESS RIGHT 10/08/2015 Simonne Come, MD [facility]  . IR  GENERIC HISTORICAL  10/08/2015   IR FLUORO GUIDE CV LINE RIGHT 10/08/2015 Simonne Come, MD [facility]  . IR REMOVAL TUN CV CATH W/O FL  03/09/2017  . MULTIPLE TOOTH EXTRACTIONS N/A   . port a cath placement Right    about 6-7 years ago  . removal of porta cath Right 09/11/15  . TUBAL LIGATION      Family History  Problem Relation Age of Onset  . Sickle cell trait Father   . Sickle cell trait Mother   . Sickle cell anemia Other     Social History   Socioeconomic History  . Marital status: Single    Spouse name: Not on file  . Number of children: Not on file  . Years of education: Not on file  . Highest education level: Not on file  Occupational History  . Occupation: None  Tobacco Use  . Smoking status: Current Some Day Smoker    Packs/day: 1.00  . Smokeless tobacco: Current User  Substance and Sexual Activity  . Alcohol use: No    Alcohol/week: 0.0 standard drinks  . Drug use: No  . Sexual activity: Yes    Partners: Male    Birth control/protection: Injection    Comment: 1 new partner recently, no concern about STI, uses condoms  Other Topics Concern  . Not on file  Social History Narrative   49 year old daughter   Social Determinants of Health   Financial Resource Strain:   . Difficulty of Paying  Living Expenses: Not on file  Food Insecurity:   . Worried About Charity fundraiser in the Last Year: Not on file  . Ran Out of Food in the Last Year: Not on file  Transportation Needs:   . Lack of Transportation (Medical): Not on file  . Lack of Transportation (Non-Medical): Not on file  Physical Activity:   . Days of Exercise per Week: Not on file  . Minutes of Exercise per Session: Not on file  Stress:   . Feeling of Stress : Not on file  Social Connections:   . Frequency of Communication with Friends and Family: Not on file  . Frequency of Social Gatherings with Friends and Family: Not on file  . Attends Religious Services: Not on file  . Active Member of Clubs  or Organizations: Not on file  . Attends Archivist Meetings: Not on file  . Marital Status: Not on file  Intimate Partner Violence:   . Fear of Current or Ex-Partner: Not on file  . Emotionally Abused: Not on file  . Physically Abused: Not on file  . Sexually Abused: Not on file    Outpatient Medications Prior to Visit  Medication Sig Dispense Refill  . medroxyPROGESTERone (DEPO-PROVERA) 150 MG/ML injection Inject 150 mg into the muscle every 3 (three) months.     Marland Kitchen oxyCODONE-acetaminophen (PERCOCET) 10-325 MG tablet Take 1 tablet by mouth every 4 (four) hours as needed for pain. 24 tablet 0  . Topiramate ER (TROKENDI XR) 100 MG CP24 Take 100 mg by mouth at bedtime. 30 capsule 3  . Deferasirox (JADENU) 360 MG TABS Take 2 tablets (720 mg total) by mouth daily after breakfast. 60 tablet 0  . folic acid (FOLVITE) 1 MG tablet Take 1 tablet (1 mg total) by mouth daily. 90 tablet 3  . hydroxyurea (HYDREA) 500 MG capsule Take 1 capsule (500 mg total) by mouth daily. 30 capsule 3  . Rivaroxaban 15 & 20 MG TBPK Take as directed on package: Start with one 15mg  tablet by mouth twice a day with food. On Day 22, switch to one 20mg  tablet once a day with food. 51 each 0  . Vitamin D, Ergocalciferol, (DRISDOL) 1.25 MG (50000 UT) CAPS capsule Take 1 capsule (50,000 Units total) by mouth every 7 (seven) days. (Patient not taking: Reported on 02/20/2019) 5 capsule 3  . potassium chloride SA (K-DUR) 20 MEQ tablet Take 1 tablet (20 mEq total) by mouth once for 1 dose. 10 tablet 0   No facility-administered medications prior to visit.    Allergies  Allergen Reactions  . Ultram [Tramadol] Other (See Comments)    Reaction:  Seizures   . Zofran [Ondansetron Hcl] Nausea And Vomiting  . Buprenorphine Hcl Hives and Other (See Comments)    Reaction:  Shaking   . Fentanyl Hives  . Morphine And Related Hives and Other (See Comments)    Reaction:  Shaking   . Tape Rash    ROS Review of Systems   Constitutional: Negative.   HENT: Negative.   Eyes: Negative.   Respiratory: Negative.   Cardiovascular: Negative.   Gastrointestinal: Positive for constipation (occasional ) and nausea (occasional ).  Endocrine: Negative.   Genitourinary: Negative.   Musculoskeletal: Positive for arthralgias (generalized joint pain ).  Skin: Negative.   Allergic/Immunologic: Negative.   Neurological: Positive for dizziness (occasional) and headaches (occasional).  Hematological: Negative.   Psychiatric/Behavioral: Negative.       Objective:    Physical Exam  Constitutional: She is oriented to person, place, and time. She appears well-developed and well-nourished.  HENT:  Head: Normocephalic.  Eyes: Conjunctivae are normal.  Cardiovascular: Normal rate, regular rhythm, normal heart sounds and intact distal pulses.  Pulmonary/Chest: Effort normal and breath sounds normal.  Abdominal: Soft.  Musculoskeletal:        General: Normal range of motion.     Cervical back: Normal range of motion.  Neurological: She is alert and oriented to person, place, and time. She has normal reflexes.  Skin: Skin is warm and dry.  Psychiatric: She has a normal mood and affect. Her behavior is normal. Judgment and thought content normal.  Nursing note and vitals reviewed.   BP 114/67   Pulse 89   Temp 98.4 F (36.9 C) (Oral)   Ht 5' (1.524 m)   Wt 128 lb 9.6 oz (58.3 kg)   SpO2 90%   BMI 25.12 kg/m  Wt Readings from Last 3 Encounters:  02/20/19 128 lb 9.6 oz (58.3 kg)  08/19/18 119 lb 9.6 oz (54.3 kg)  08/07/18 117 lb 4.6 oz (53.2 kg)     Health Maintenance Due  Topic Date Due  . PAP SMEAR-Modifier  07/31/2005    There are no preventive care reminders to display for this patient.  Lab Results  Component Value Date   TSH 3.870 08/19/2018   Lab Results  Component Value Date   WBC 12.4 (H) 11/14/2018   HGB 8.3 (L) 11/14/2018   HCT 24.3 (L) 11/14/2018   MCV 103.8 (H) 11/14/2018   PLT 311  11/14/2018   Lab Results  Component Value Date   NA 141 11/14/2018   K 3.2 (L) 11/14/2018   CO2 22 11/14/2018   GLUCOSE 118 (H) 11/14/2018   BUN 8 11/14/2018   CREATININE 0.38 (L) 11/14/2018   BILITOT 2.9 (H) 11/14/2018   ALKPHOS 199 (H) 11/14/2018   AST 59 (H) 11/14/2018   ALT 34 11/14/2018   PROT 7.4 11/14/2018   ALBUMIN 3.9 11/14/2018   CALCIUM 8.9 11/14/2018   ANIONGAP 6 11/14/2018   No results found for: CHOL No results found for: HDL No results found for: LDLCALC No results found for: TRIG No results found for: CHOLHDL No results found for: [id number]    Assessment & Plan:   1. Hospital discharge follow-up  2. Sickle cell pain crisis (HCC) Urine pregnancy test is negative . She is doing well today. She will continue to take pain medications as prescribed; will continue to avoid extreme heat and cold; will continue to eat a healthy diet and drink at least 64 ounces of water daily; continue stool softener as needed; will avoid colds and flu; will continue to get plenty of sleep and rest; will continue to avoid high stressful situations and remain infection free; will continue Folic Acid 1 mg daily to avoid sickle cell crisis. Continue to follow up with Hematologist as needed.  - POCT urine pregnancy  3. Hb-SS disease without crisis (HCC) - Deferasirox (JADENU) 360 MG TABS; Take 2 tablets (720 mg total) by mouth daily after breakfast.  Dispense: 60 tablet; Refill: 0 - folic acid (FOLVITE) 1 MG tablet; Take 1 tablet (1 mg total) by mouth daily.  Dispense: 90 tablet; Refill: 3 - hydroxyurea (HYDREA) 500 MG capsule; Take 1 capsule (500 mg total) by mouth daily.  Dispense: 30 capsule; Refill: 3  4. Chronic, continuous use of opioids - [id number] 11+Oxyco+Alc+Crt-Bund  5. Under pain management with specialist Opioids filled by Pain Management  physician at [facility].   6. Depo-Provera contraceptive status She received Depo-Provera Injection today.  - Urinalysis  Dipstick  7. Encounter for Depo-Provera contraception - medroxyPROGESTERone (DEPO-PROVERA) injection 150 mg  8. Insomnia, unspecified type - traZODone (DESYREL) 50 MG tablet; Take 0.5-1 tablets (25-50 mg total) by mouth at bedtime as needed for sleep.  Dispense: 30 tablet; Refill: 6  9. Chest wall discomfort - DG Chest 2 View; Future  10. Follow up She will follow up in 2 months.   Meds ordered this encounter  Medications  . medroxyPROGESTERone (DEPO-PROVERA) injection 150 mg  . traZODone (DESYREL) 50 MG tablet    Sig: Take 0.5-1 tablets (25-50 mg total) by mouth at bedtime as needed for sleep.    Dispense:  30 tablet    Refill:  6  . Deferasirox (JADENU) 360 MG TABS    Sig: Take 2 tablets (720 mg total) by mouth daily after breakfast.    Dispense:  60 tablet    Refill:  0  . folic acid (FOLVITE) 1 MG tablet    Sig: Take 1 tablet (1 mg total) by mouth daily.    Dispense:  90 tablet    Refill:  3  . hydroxyurea (HYDREA) 500 MG capsule    Sig: Take 1 capsule (500 mg total) by mouth daily.    Dispense:  30 capsule    Refill:  3  . rivaroxaban (XARELTO) 20 MG TABS tablet    Sig: Take 1 tablet (20 mg total) by mouth daily with supper.    Dispense:  30 tablet    Refill:  6    Orders Placed This Encounter  Procedures  . DG Chest 2 View  . [id number] 11+Oxyco+Alc+Crt-Bund  . Urinalysis Dipstick  . POCT urine pregnancy    Referral Orders  No referral(s) requested today    Raliegh Ip,  MSN, FNP-BC  Patient Care [facility]le Cell [facility] [address] [place], [place] [address] [phone]  Problem List Items Addressed This Visit      Other   Hb-SS disease without crisis (HCC) (Chronic)   Relevant Medications   Deferasirox (JADENU) 360 MG TABS   folic acid (FOLVITE) 1 MG tablet   hydroxyurea (HYDREA) 500 MG capsule   Sickle cell pain crisis (HCC)   Relevant Medications   folic acid (FOLVITE) 1 MG  tablet   Other Relevant Orders   POCT urine pregnancy (Completed)    Other Visit Diagnoses    Hospital discharge follow-up    -  Primary   Chronic, continuous use of opioids       Relevant Orders   [id number] 11+Oxyco+Alc+Crt-Bund   Depo-Provera contraceptive status       Relevant Orders   Urinalysis Dipstick (Completed)   Encounter for Depo-Provera contraception       Relevant Medications   medroxyPROGESTERone (DEPO-PROVERA) injection 150 mg   Insomnia, unspecified type       Relevant Medications   traZODone (DESYREL) 50 MG tablet   Chest wall discomfort       Relevant Orders   DG Chest 2 View   Follow up          Meds ordered this encounter  Medications  . medroxyPROGESTERone (DEPO-PROVERA) injection 150 mg  . traZODone (DESYREL) 50 MG tablet    Sig: Take 0.5-1 tablets (25-50 mg total) by mouth at bedtime as needed for sleep.    Dispense:  30 tablet  Refill:  6  . Deferasirox (JADENU) 360 MG TABS    Sig: Take 2 tablets (720 mg total) by mouth daily after breakfast.    Dispense:  60 tablet    Refill:  0  . folic acid (FOLVITE) 1 MG tablet    Sig: Take 1 tablet (1 mg total) by mouth daily.    Dispense:  90 tablet    Refill:  3  . hydroxyurea (HYDREA) 500 MG capsule    Sig: Take 1 capsule (500 mg total) by mouth daily.    Dispense:  30 capsule    Refill:  3  . rivaroxaban (XARELTO) 20 MG TABS tablet    Sig: Take 1 tablet (20 mg total) by mouth daily with supper.    Dispense:  30 tablet    Refill:  6    Follow-up: Return in about 2 months (around 04/23/2019).    Kallie Locks, FNP

## 2019-02-26 LAB — DRUG SCREEN 764883 11+OXYCO+ALC+CRT-BUND
Amphetamines, Urine: NEGATIVE ng/mL
BENZODIAZ UR QL: NEGATIVE ng/mL
Barbiturate: NEGATIVE ng/mL
Cannabinoid Quant, Ur: NEGATIVE ng/mL
Cocaine (Metabolite): NEGATIVE ng/mL
Creatinine: 94.9 mg/dL (ref 20.0–300.0)
Ethanol: NEGATIVE %
Meperidine: NEGATIVE ng/mL
Methadone Screen, Urine: NEGATIVE ng/mL
Phencyclidine: NEGATIVE ng/mL
Propoxyphene: NEGATIVE ng/mL
Tramadol: NEGATIVE ng/mL
pH, Urine: 5.5 (ref 4.5–8.9)

## 2019-02-26 LAB — OPIATES CONFIRMATION, URINE
Codeine: NEGATIVE
Hydrocodone Confirm: >3000 ng/mL
Hydrocodone: POSITIVE — AB
Hydromorphone Confirm: 1849 ng/mL
Hydromorphone: POSITIVE — AB
Morphine: NEGATIVE
Opiates: POSITIVE ng/mL — AB

## 2019-02-26 LAB — OXYCODONE/OXYMORPHONE, CONFIRM
OXYCODONE/OXYMORPH: POSITIVE — AB
OXYCODONE: >3000 ng/mL
OXYCODONE: POSITIVE — AB
OXYMORPHONE (GC/MS): >3000 ng/mL
OXYMORPHONE: POSITIVE — AB

## 2019-02-27 ENCOUNTER — Telehealth (HOSPITAL_COMMUNITY): Payer: Self-pay | Admitting: *Deleted

## 2019-02-27 NOTE — Telephone Encounter (Signed)
Patient called requesting to come to the day hospital for sickle cell pain. Patient reports bilateral hip, leg and stomach pain rated 7/10. Reports last taking Oxycodone at 5:00 am. COVID-19 screening done and patient denies all symptoms. Denies fever, chest pain, vomiting, and diarrhea. Admits to having transportation without driving self. Dr. Jonelle Sidle notified. Patient can come to the day hospital for pain management. Patient advised and expresses an understanding.

## 2019-03-01 ENCOUNTER — Emergency Department (HOSPITAL_COMMUNITY)
Admission: EM | Admit: 2019-03-01 | Discharge: 2019-03-01 | Disposition: A | Discharge status: Home / Self Care | Payer: Medicaid Other | Attending: Emergency Medicine | Admitting: Emergency Medicine

## 2019-03-01 ENCOUNTER — Telehealth (HOSPITAL_COMMUNITY): Payer: Self-pay | Admitting: General Practice

## 2019-03-01 ENCOUNTER — Other Ambulatory Visit: Payer: Self-pay

## 2019-03-01 ENCOUNTER — Encounter (HOSPITAL_COMMUNITY): Payer: Self-pay | Admitting: Obstetrics and Gynecology

## 2019-03-01 DIAGNOSIS — F17290 Nicotine dependence, other tobacco product, uncomplicated: Secondary | ICD-10-CM | POA: Insufficient documentation

## 2019-03-01 DIAGNOSIS — R52 Pain, unspecified: Secondary | ICD-10-CM | POA: Diagnosis present

## 2019-03-01 DIAGNOSIS — Z7901 Long term (current) use of anticoagulants: Secondary | ICD-10-CM | POA: Diagnosis not present

## 2019-03-01 DIAGNOSIS — Z79899 Other long term (current) drug therapy: Secondary | ICD-10-CM | POA: Insufficient documentation

## 2019-03-01 DIAGNOSIS — D5700 Hb-SS disease with crisis, unspecified: Secondary | ICD-10-CM | POA: Diagnosis not present

## 2019-03-01 LAB — CBC WITH DIFFERENTIAL/PLATELET
Abs Immature Granulocytes: 0.06 10*3/uL (ref 0.00–0.07)
Basophils Absolute: 0 10*3/uL (ref 0.0–0.1)
Basophils Relative: 0 %
Eosinophils Absolute: 0 10*3/uL (ref 0.0–0.5)
Eosinophils Relative: 0 %
HCT: 24.7 % — ABNORMAL LOW (ref 36.0–46.0)
Hemoglobin: 8.4 g/dL — ABNORMAL LOW (ref 12.0–15.0)
Immature Granulocytes: 1 %
Lymphocytes Relative: 33 %
Lymphs Abs: 3.9 10*3/uL (ref 0.7–4.0)
MCH: 33.2 pg (ref 26.0–34.0)
MCHC: 34 g/dL (ref 30.0–36.0)
MCV: 97.6 fL (ref 80.0–100.0)
Monocytes Absolute: 1.2 10*3/uL — ABNORMAL HIGH (ref 0.1–1.0)
Monocytes Relative: 10 %
Neutro Abs: 6.6 10*3/uL (ref 1.7–7.7)
Neutrophils Relative %: 56 %
Platelets: 465 10*3/uL — ABNORMAL HIGH (ref 150–400)
RBC: 2.53 MIL/uL — ABNORMAL LOW (ref 3.87–5.11)
RDW: 22.5 % — ABNORMAL HIGH (ref 11.5–15.5)
WBC: 11.8 10*3/uL — ABNORMAL HIGH (ref 4.0–10.5)
nRBC: 4.5 % — ABNORMAL HIGH (ref 0.0–0.2)

## 2019-03-01 LAB — I-STAT BETA HCG BLOOD, ED (MC, WL, AP ONLY): I-stat hCG, quantitative: <5 m[IU]/mL (ref <5)

## 2019-03-01 LAB — RETICULOCYTES
Immature Retic Fract: 24.9 % — ABNORMAL HIGH (ref 2.3–15.9)
RBC.: 2.55 MIL/uL — ABNORMAL LOW (ref 3.87–5.11)
Retic Count, Absolute: 585.5 10*3/uL — ABNORMAL HIGH (ref 19.0–186.0)
Retic Ct Pct: 23 % — ABNORMAL HIGH (ref 0.4–3.1)

## 2019-03-01 LAB — COMPREHENSIVE METABOLIC PANEL
ALT: 36 U/L (ref 0–44)
AST: 67 U/L — ABNORMAL HIGH (ref 15–41)
Albumin: 4.3 g/dL (ref 3.5–5.0)
Alkaline Phosphatase: 163 U/L — ABNORMAL HIGH (ref 38–126)
Anion gap: 9 (ref 5–15)
BUN: 8 mg/dL (ref 6–20)
CO2: 16 mmol/L — ABNORMAL LOW (ref 22–32)
Calcium: 9.2 mg/dL (ref 8.9–10.3)
Chloride: 112 mmol/L — ABNORMAL HIGH (ref 98–111)
Creatinine, Ser: 0.31 mg/dL — ABNORMAL LOW (ref 0.44–1.00)
GFR calc Af Amer: >60 mL/min (ref >60)
GFR calc non Af Amer: >60 mL/min (ref >60)
Glucose, Bld: 107 mg/dL — ABNORMAL HIGH (ref 70–99)
Potassium: 3.9 mmol/L (ref 3.5–5.1)
Sodium: 137 mmol/L (ref 135–145)
Total Bilirubin: 2.9 mg/dL — ABNORMAL HIGH (ref 0.3–1.2)
Total Protein: 7.8 g/dL (ref 6.5–8.1)

## 2019-03-01 MED ORDER — SODIUM CHLORIDE 0.9% FLUSH
3.0000 mL | Freq: Once | INTRAVENOUS | Status: AC
  Administered 2019-03-01: 3 mL via INTRAVENOUS

## 2019-03-01 MED ORDER — OXYCODONE HCL 5 MG PO TABS
30.0000 mg | ORAL_TABLET | Freq: Once | ORAL | Status: AC
  Administered 2019-03-01: 30 mg via ORAL
  Filled 2019-03-01: qty 6

## 2019-03-01 MED ORDER — HEPARIN SOD (PORK) LOCK FLUSH 100 UNIT/ML IV SOLN
500.0000 [IU] | Freq: Once | INTRAVENOUS | Status: AC
  Administered 2019-03-01: 500 [IU]
  Filled 2019-03-01: qty 5

## 2019-03-01 MED ORDER — ACETAMINOPHEN 500 MG PO TABS
1000.0000 mg | ORAL_TABLET | Freq: Once | ORAL | Status: AC
  Administered 2019-03-01: 1000 mg via ORAL
  Filled 2019-03-01: qty 2

## 2019-03-01 MED ORDER — HYDROMORPHONE HCL 2 MG/ML IJ SOLN
2.0000 mg | INTRAMUSCULAR | Status: DC | PRN
  Administered 2019-03-01: 1 mg via SUBCUTANEOUS
  Administered 2019-03-01: 2 mg via SUBCUTANEOUS
  Filled 2019-03-01 (×3): qty 1

## 2019-03-01 MED ORDER — HYDROMORPHONE HCL 2 MG/ML IJ SOLN
2.0000 mg | Freq: Once | INTRAMUSCULAR | Status: AC
  Administered 2019-03-01: 2 mg via INTRAVENOUS
  Filled 2019-03-01: qty 1

## 2019-03-01 MED ORDER — SODIUM CHLORIDE 0.45 % IV SOLN: INTRAVENOUS | Status: DC

## 2019-03-01 NOTE — ED Provider Notes (Signed)
[facility]-EMERGENCY DEPT Provider Note   CSN: [id number] Arrival date & time: 03/01/19  1034     History Chief Complaint  Patient presents with  . Sickle Cell Pain Crisis    Brittany Foster is a 33 y.o. female.  The history is provided by the patient and medical records. No language interpreter was used.       33 year old female with history of sickle cell disease, opiate dependency, with a care plan in place presenting for evaluation of sickle cell related pain. Patient report for the past 2 to 3 days she has had progressive worsening pain to her ribs, hips, legs, thoracic region that felt similar to prior sickle cell flare. Pain is described as a sharp throbbing shooting sensation, persistent, not adequately managed with her home medication and heating pad. The past 2 days she also endorsed feeling nauseous, and admits to several nonbloody nonbilious emesis. She denies any associated fever, chills, productive cough, shortness of breath, chest pain, dysuria. She endorsed occasional vaginal spotting but been Depo shot she does not have regular menstruation. She did reach out to the sickle cell clinic for outpatient follow-up however she was told that it was full and she should come to the ER for further evaluation. She mention her last bowel movement was today and was normal. She attributed her symptoms due to increasing stress but denies any recent sickness.    Percocet 10-325 mg every 4 hours as needed.        Care plan for ER and [facility]:      Review laboratory values.  Consider not repeating labs (CBC w/diff, CMP, retic count) if drawn within the last 48 hours if patient is hemodynamically stable.        IV fluids 0.45% saline @ 125 cc/hr   Dilaudid 2 mg SQ X 2 doses    Tylenol 1000 mg times 1   Oxycodone 30 mg X  1 dose      If pain intensity remains elevated, utilize clinical judgement to determine whether patient warrants admission. If between  hours of 8am and 2:30 pm, please notify [facility] for further pain management.    Baseline hemoglobin is 7-8. Patient typically not transfused unless hemoglobin is <6.0.      Past Medical History:  Diagnosis Date  . Anemia   . Depression, major, recurrent (HCC)   . Migraines   . Sickle cell anemia [facility])     Patient Active Problem List   Diagnosis Date Noted  . Chest wall discomfort 02/20/2019  . Sickle cell pain crisis (HCC) 08/07/2018  . Venous thromboembolism 08/07/2018  . Hypotension 01/26/2016  . Opiate dependence (HCC) 01/23/2016  . MDD (major depressive disorder), recurrent severe, without psychosis (HCC) 01/10/2016  . Vitamin D deficiency 01/08/2015  . Chronic pain syndrome 01/08/2015  . Hypokalemia 08/21/2014  . 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 Anemia of chronic disease 02/09/2014  . Neuropathy (HCC) 02/09/2014    Past Surgical History:  Procedure Laterality Date  . CESAREAN SECTION    . CHOLECYSTECTOMY  2000  . IR GENERIC HISTORICAL  10/08/2015   IR US GUIDE VASC ACCESS RIGHT 10/08/2015 Simonne Come, MD [facility]-INTERV RAD  . IR GENERIC HISTORICAL  10/08/2015   IR FLUORO GUIDE CV LINE RIGHT 10/08/2015 Simonne Come, MD [facility]-INTERV RAD  . IR REMOVAL TUN CV CATH W/O FL  03/09/2017  . MULTIPLE TOOTH EXTRACTIONS N/A   . port a cath placement Right    about  6-7 years ago  . removal of porta cath Right 09/11/15  . TUBAL LIGATION       OB History    Gravida  1   Para      Term      Preterm      AB      Living  1     SAB      TAB      Ectopic      Multiple      Live Births  1           Family History  Problem Relation Age of Onset  . Sickle cell trait Father   . Sickle cell trait Mother   . Sickle cell anemia Other     Social History   Tobacco Use  . Smoking status: Current Some Day Smoker    Packs/day: 1.00  . Smokeless tobacco: Current User  Substance Use Topics  . Alcohol use: No    Alcohol/week: 0.0 standard drinks  . Drug use: No     Home Medications Prior to Admission medications   Medication Sig Start Date End Date Taking? Authorizing Provider  Deferasirox (JADENU) 360 MG TABS Take 2 tablets (720 mg total) by mouth daily after breakfast. 02/20/19   Kallie LocksStroud, Natalie M, FNP  folic acid (FOLVITE) 1 MG tablet Take 1 tablet (1 mg total) by mouth daily. 02/20/19   Kallie LocksStroud, Natalie M, FNP  hydroxyurea (HYDREA) 500 MG capsule Take 1 capsule (500 mg total) by mouth daily. 02/20/19   Kallie LocksStroud, Natalie M, FNP  medroxyPROGESTERone (DEPO-PROVERA) 150 MG/ML injection Inject 150 mg into the muscle every 3 (three) months.     [provider]  oxyCODONE-acetaminophen (PERCOCET) 10-325 MG tablet Take 1 tablet by mouth every 4 (four) hours as needed for pain. 03/11/17   Altha HarmMatthews, Michelle A, MD  rivaroxaban (XARELTO) 20 MG TABS tablet Take 1 tablet (20 mg total) by mouth daily with supper. 02/20/19   Kallie LocksStroud, Natalie M, FNP  Topiramate ER (TROKENDI XR) 100 MG CP24 Take 100 mg by mouth at bedtime. 08/19/18   Kallie LocksStroud, Natalie M, FNP  traZODone (DESYREL) 50 MG tablet Take 0.5-1 tablets (25-50 mg total) by mouth at bedtime as needed for sleep. 02/20/19   Kallie LocksStroud, Natalie M, FNP  Vitamin D, Ergocalciferol, (DRISDOL) 1.25 MG (50000 UT) CAPS capsule Take 1 capsule (50,000 Units total) by mouth every 7 (seven) days. Patient not taking: Reported on 02/20/2019 08/19/18   Kallie LocksStroud, Natalie M, FNP    Allergies    Ultram [tramadol], Zofran Frazier Richards[ondansetron hcl], Buprenorphine hcl, Fentanyl, Morphine and related, and Tape  Review of Systems   Review of Systems  All other systems reviewed and are negative.   Physical Exam Updated Vital Signs BP 125/76 (BP Location: Left Arm)   Pulse 85   Temp 98.4 F (36.9 C)   Resp 18   Ht 5' (1.524 m)   Wt 58.1 kg   SpO2 94%   BMI 25.00 kg/m   Physical Exam Vitals and nursing note reviewed.  Constitutional:      General: She is not in acute distress.    Appearance: She is well-developed.  HENT:      Head: Atraumatic.  Eyes:     Conjunctiva/sclera: Conjunctivae normal.  Cardiovascular:     Rate and Rhythm: Normal rate and regular rhythm.     Pulses: Normal pulses.     Heart sounds: Normal heart sounds.  Pulmonary:     Effort: Pulmonary effort is  normal.     Breath sounds: Normal breath sounds. No wheezing or rhonchi.  Abdominal:     Palpations: Abdomen is soft.     Tenderness: There is no abdominal tenderness.  Musculoskeletal:        General: Normal range of motion.     Cervical back: Neck supple.  Skin:    Findings: No rash.  Neurological:     Mental Status: She is alert and oriented to person, place, and time.  Psychiatric:        Mood and Affect: Mood normal.     ED Results / Procedures / Treatments   Labs (all labs ordered are listed, but only abnormal results are displayed) Labs Reviewed  COMPREHENSIVE METABOLIC PANEL - Abnormal; Notable for the following components:      Result Value   Chloride 112 (*)    CO2 16 (*)    Glucose, Bld 107 (*)    Creatinine, Ser 0.31 (*)    AST 67 (*)    Alkaline Phosphatase 163 (*)    Total Bilirubin 2.9 (*)    All other components within normal limits  CBC WITH DIFFERENTIAL/PLATELET - Abnormal; Notable for the following components:   WBC 11.8 (*)    RBC 2.53 (*)    Hemoglobin 8.4 (*)    HCT 24.7 (*)    RDW 22.5 (*)    Platelets 465 (*)    nRBC 4.5 (*)    Monocytes Absolute 1.2 (*)    All other components within normal limits  RETICULOCYTES - Abnormal; Notable for the following components:   Retic Ct Pct 23.0 (*)    RBC. 2.55 (*)    Retic Count, Absolute 585.5 (*)    Immature Retic Fract 24.9 (*)    All other components within normal limits  I-STAT BETA HCG BLOOD, ED ([facility], [facility], [facility] ONLY)    EKG None  Radiology No results found.  Procedures Procedures (including critical care time)  Medications Ordered in ED Medications  0.45 % sodium chloride infusion ( Intravenous New Bag/Given 03/01/19 1151)  HYDROmorphone  (DILAUDID) injection 2 mg (2 mg Subcutaneous Given 03/01/19 1326)  HYDROmorphone (DILAUDID) injection 2 mg (has no administration in time range)  sodium chloride flush (NS) 0.9 % injection 3 mL (3 mLs Intravenous Given 03/01/19 1134)  acetaminophen (TYLENOL) tablet 1,000 mg (1,000 mg Oral Given 03/01/19 1146)  oxyCODONE (Oxy IR/ROXICODONE) immediate release tablet 30 mg (30 mg Oral Given 03/01/19 1146)    ED Course  I have reviewed the triage vital signs and the nursing notes.  Pertinent labs & imaging results that were available during my care of the patient were reviewed by me and considered in my medical decision making (see chart for details).    MDM Rules/Calculators/A&P                      BP 108/69   Pulse 93   Temp 98.4 F (36.9 C)   Resp 16   Ht 5' (1.524 m)   Wt 58.1 kg   SpO2 97%   BMI 25.00 kg/m   Final Clinical Impression(s) / ED Diagnoses Final diagnoses:  Sickle cell pain crisis ([place])    Rx / DC Orders ED Discharge Orders    None     11:39 AM Patient with history of sickle cell disease here with pain throughout her body similar to prior sickle cell crisis likely brought on by increasing stressed. Pain not adequately managed with her home  medication. She does not have any fever or productive cough shortness of breath concerning for acute chest. No COVID-19 symptoms. She does have a care plan in place, will execute the plan with the appropriate medication recommended. Will monitor closely. At this time she is well-appearing with stable normal vital signs and no hypoxia. She is afebrile.  3:02 PM Patient received treatment per her care plan however reported no significant improvement of her pain.  She does not have evidence suggesting acute chest.  I reached out and spoke with sickle cell specialist Geanie Logan who request for pt to receive 2mg  if IV dilaudid and pt can go to the sickle cell clinic at 8am tomorrow for PCA pain management.  Pt agrees with plan.   Will provide pain medication.    , PA-C 03/01/19 1516    [place], 03/03/19, MD 03/01/19 [phone]

## 2019-03-01 NOTE — ED Triage Notes (Signed)
Pt reports SCC. Patient endorses nausea and left rib cage pain as well as pain in her back and hips. Patient reports she has been taking her home medication without relief. Patient reports she called the sickle cell clinic and was told they were full and told to come to the ED

## 2019-03-01 NOTE — Telephone Encounter (Signed)
Patient called, complained of pain in the legs, hips and back   rated at 8/10. Denied chest pain, fever, diarrhea, abdominal pain, nausea/vomitting. Screened negative for Covid-19 symptoms. Admitted to having means of transportation without driving self after treatment. Last took 10 mg of oxycodone. Per provider, the day hospital does not have the capacity to treat her today. Patient told to go the ER if the pain is uncontrollable with prescribed pain medications. Patient notified, verbalized understanding.

## 2019-03-01 NOTE — Discharge Instructions (Addendum)
Please go to the Sickle Cell Clinic at [facility] tomorrow for further management of your pain.  You will receive PCA pain management.

## 2019-03-02 ENCOUNTER — Non-Acute Institutional Stay (HOSPITAL_COMMUNITY)
Admission: AD | Admit: 2019-03-02 | Discharge: 2019-03-02 | Disposition: A | Discharge status: Home / Self Care | Payer: Medicaid Other | Source: Ambulatory Visit | Attending: Internal Medicine | Admitting: Internal Medicine

## 2019-03-02 DIAGNOSIS — D5700 Hb-SS disease with crisis, unspecified: Secondary | ICD-10-CM | POA: Diagnosis not present

## 2019-03-02 DIAGNOSIS — R52 Pain, unspecified: Secondary | ICD-10-CM | POA: Diagnosis present

## 2019-03-02 DIAGNOSIS — F17210 Nicotine dependence, cigarettes, uncomplicated: Secondary | ICD-10-CM | POA: Insufficient documentation

## 2019-03-02 DIAGNOSIS — Z79899 Other long term (current) drug therapy: Secondary | ICD-10-CM | POA: Insufficient documentation

## 2019-03-02 DIAGNOSIS — Z7901 Long term (current) use of anticoagulants: Secondary | ICD-10-CM | POA: Insufficient documentation

## 2019-03-02 DIAGNOSIS — Z86711 Personal history of pulmonary embolism: Secondary | ICD-10-CM | POA: Insufficient documentation

## 2019-03-02 MED ORDER — KETOROLAC TROMETHAMINE 30 MG/ML IJ SOLN: 15.0000 mg | Freq: Once | INTRAMUSCULAR | Status: DC

## 2019-03-02 MED ORDER — HEPARIN SOD (PORK) LOCK FLUSH 100 UNIT/ML IV SOLN
500.0000 [IU] | INTRAVENOUS | Status: AC | PRN
  Administered 2019-03-02: 500 [IU]
  Filled 2019-03-02: qty 5

## 2019-03-02 MED ORDER — SODIUM CHLORIDE 0.9% FLUSH
9.0000 mL | INTRAVENOUS | Status: DC | PRN
  Administered 2019-03-02: 9 mL via INTRAVENOUS

## 2019-03-02 MED ORDER — SODIUM CHLORIDE 0.45 % IV SOLN: INTRAVENOUS | Status: DC

## 2019-03-02 MED ORDER — ACETAMINOPHEN 500 MG PO TABS
1000.0000 mg | ORAL_TABLET | Freq: Once | ORAL | Status: AC
  Administered 2019-03-02: 1000 mg via ORAL
  Filled 2019-03-02: qty 2

## 2019-03-02 MED ORDER — NALOXONE HCL 0.4 MG/ML IJ SOLN: 0.4000 mg | INTRAMUSCULAR | Status: DC | PRN

## 2019-03-02 MED ORDER — DIPHENHYDRAMINE HCL 25 MG PO CAPS
25.0000 mg | ORAL_CAPSULE | ORAL | Status: DC | PRN
  Administered 2019-03-02: 25 mg via ORAL
  Filled 2019-03-02: qty 1

## 2019-03-02 MED ORDER — HYDROMORPHONE 1 MG/ML IV SOLN
INTRAVENOUS | Status: DC
  Administered 2019-03-02: 30 mg via INTRAVENOUS
  Administered 2019-03-02: 13.5 mg via INTRAVENOUS
  Filled 2019-03-02: qty 30

## 2019-03-02 NOTE — H&P (Signed)
[facility] History and Physical   Date: 03/02/2019  Patient name: Brittany Foster Medical record number: [id number] Date of birth: [birth year] Age: 33 y.o. Gender: female PCP: Azzie Glatter, FNP  Attending physician: Tresa Garter, MD  Chief Complaint: Sickle cell disease  History of Present Illness: Brittany Foster, a 33 year old female with a medical history significant for sickle cell disease, chronic pain syndrome, opiate dependence and tolerance, hospital overutilization, history of anemia of chronic disease, history of PE on Xarelto, and depression presents complaining of generalized pain that is consistent with her typical pain crisis.  Patient attributes crisis to changes in weather.  She was treated and evaluated in the ER on yesterday and was advised to follow-up in sickle cell day infusion clinic for treatment on today.  Her pain intensity is 9/10 primarily to bilateral hips, and upper and lower extremities.  She last had extended release Dilaudid and Percocet this a.m. without sustained relief.  Patient is followed by pain management for chronic pain syndrome.  She denies headache, fever, chills, shortness of breath, chest pain, urinary symptoms, nausea, vomiting, or diarrhea.  No sick contacts, recent travel, or exposure to COVID-19. Meds: Medications Prior to Admission  Medication Sig Dispense Refill Last Dose  . Deferasirox (JADENU) 360 MG TABS Take 2 tablets (720 mg total) by mouth daily after breakfast. 60 tablet 0   . diphenhydrAMINE (BENADRYL) 25 mg capsule Take 25 mg by mouth every 6 (six) hours as needed for itching.      . folic acid (FOLVITE) 1 MG tablet Take 1 tablet (1 mg total) by mouth daily. 90 tablet 3   . HYDROmorphone HCl (EXALGO) 8 MG TB24 Take 16 mg by mouth 2 (two) times daily as needed for pain.     . hydroxyurea (HYDREA) 500 MG capsule Take 1 capsule (500 mg total) by mouth daily. 30 capsule 3   . medroxyPROGESTERone (DEPO-PROVERA) 150  MG/ML injection Inject 150 mg into the muscle every 3 (three) months.      Marland Kitchen oxyCODONE-acetaminophen (PERCOCET) 10-325 MG tablet Take 1 tablet by mouth every 4 (four) hours as needed for pain. 24 tablet 0   . rivaroxaban (XARELTO) 20 MG TABS tablet Take 1 tablet (20 mg total) by mouth daily with supper. 30 tablet 6   . Topiramate ER (TROKENDI XR) 100 MG CP24 Take 100 mg by mouth at bedtime. 30 capsule 3   . traZODone (DESYREL) 50 MG tablet Take 0.5-1 tablets (25-50 mg total) by mouth at bedtime as needed for sleep. 30 tablet 6   . Vitamin D, Ergocalciferol, (DRISDOL) 1.25 MG (50000 UT) CAPS capsule Take 1 capsule (50,000 Units total) by mouth every 7 (seven) days. 5 capsule 3     Allergies: Ultram [tramadol], Zofran [ondansetron hcl], Buprenorphine hcl, Fentanyl, Morphine and related, and Tape Past Medical History:  Diagnosis Date  . Anemia   . Depression, major, recurrent ([place])   . Migraines   . Sickle cell anemia (HCC)    Past Surgical History:  Procedure Laterality Date  . CESAREAN SECTION    . CHOLECYSTECTOMY  2000  . IR GENERIC HISTORICAL  10/08/2015   IR US GUIDE VASC ACCESS RIGHT 10/08/2015 Sandi Mariscal, MD [facility]-INTERV RAD  . IR GENERIC HISTORICAL  10/08/2015   IR FLUORO GUIDE CV LINE RIGHT 10/08/2015 Sandi Mariscal, MD [facility]INTERV RAD  . IR REMOVAL TUN CV CATH W/O FL  03/09/2017  . MULTIPLE TOOTH EXTRACTIONS N/A   . port a cath placement Right  about 6-7 years ago  . removal of porta cath Right 09/11/15  . TUBAL LIGATION     Family History  Problem Relation Age of Onset  . Sickle cell trait Father   . Sickle cell trait Mother   . Sickle cell anemia Other    Social History   Socioeconomic History  . Marital status: Single    Spouse name: Not on file  . Number of children: Not on file  . Years of education: Not on file  . Highest education level: Not on file  Occupational History  . Occupation: None  Tobacco Use  . Smoking status: Current Some Day Smoker    Packs/day: 1.00  .  Smokeless tobacco: Current User  Substance and Sexual Activity  . Alcohol use: No    Alcohol/week: 0.0 standard drinks  . Drug use: No  . Sexual activity: Yes    Partners: Male    Birth control/protection: Injection    Comment: 1 new partner recently, no concern about STI, uses condoms  Other Topics Concern  . Not on file  Social History Narrative   34 year old daughter   Social Determinants of Health   Financial Resource Strain:   . Difficulty of Paying Living Expenses: Not on file  Food Insecurity:   . Worried About Programme researcher, broadcasting/film/videounning Out of Food in the Last Year: Not on file  . Ran Out of Food in the Last Year: Not on file  Transportation Needs:   . Lack of Transportation (Medical): Not on file  . Lack of Transportation (Non-Medical): Not on file  Physical Activity:   . Days of Exercise per Week: Not on file  . Minutes of Exercise per Session: Not on file  Stress:   . Feeling of Stress : Not on file  Social Connections:   . Frequency of Communication with Friends and Family: Not on file  . Frequency of Social Gatherings with Friends and Family: Not on file  . Attends Religious Services: Not on file  . Active Member of Clubs or Organizations: Not on file  . Attends BankerClub or Organization Meetings: Not on file  . Marital Status: Not on file  Intimate Partner Violence:   . Fear of Current or Ex-Partner: Not on file  . Emotionally Abused: Not on file  . Physically Abused: Not on file  . Sexually Abused: Not on file   Review of Systems  Constitutional: Negative.   HENT: Negative.   Eyes: Negative.   Respiratory: Negative.   Cardiovascular: Negative.   Gastrointestinal: Negative.   Genitourinary: Negative.   Musculoskeletal: Positive for joint pain.  Skin: Negative.   Neurological: Negative.   Psychiatric/Behavioral: Negative.    Physical Exam Constitutional:      Appearance: Normal appearance. She is normal weight.  Eyes:     Pupils: Pupils are equal, round, and reactive  to light.  Cardiovascular:     Rate and Rhythm: Normal rate and regular rhythm.  Pulmonary:     Effort: Pulmonary effort is normal.  Abdominal:     General: Bowel sounds are normal.     Palpations: Abdomen is soft.  Neurological:     General: No focal deficit present.     Mental Status: She is oriented to person, place, and time. Mental status is at baseline.  Psychiatric:        Mood and Affect: Mood normal.        Behavior: Behavior normal.        Thought Content: Thought  content normal.        Judgment: Judgment normal.     Lab results: Results for orders placed or performed during the hospital encounter of 03/01/19 (from the past 24 hour(s))  Comprehensive metabolic panel     Status: Abnormal   Collection Time: 03/01/19 11:35 AM  Result Value Ref Range   Sodium 137 135 - 145 mmol/L   Potassium 3.9 3.5 - 5.1 mmol/L   Chloride 112 (H) 98 - 111 mmol/L   CO2 16 (L) 22 - 32 mmol/L   Glucose, Bld 107 (H) 70 - 99 mg/dL   BUN 8 6 - 20 mg/dL   Creatinine, Ser 1.61 (L) 0.44 - 1.00 mg/dL   Calcium 9.2 8.9 - 09.6 mg/dL   Total Protein 7.8 6.5 - 8.1 g/dL   Albumin 4.3 3.5 - 5.0 g/dL   AST 67 (H) 15 - 41 U/L   ALT 36 0 - 44 U/L   Alkaline Phosphatase 163 (H) 38 - 126 U/L   Total Bilirubin 2.9 (H) 0.3 - 1.2 mg/dL   GFR calc non Af Amer >60 >60 mL/min   GFR calc Af Amer >60 >60 mL/min   Anion gap 9 5 - 15  CBC with Differential     Status: Abnormal   Collection Time: 03/01/19 11:35 AM  Result Value Ref Range   WBC 11.8 (H) 4.0 - 10.5 K/uL   RBC 2.53 (L) 3.87 - 5.11 MIL/uL   Hemoglobin 8.4 (L) 12.0 - 15.0 g/dL   HCT 04.5 (L) 40.9 - 81.1 %   MCV 97.6 80.0 - 100.0 fL   MCH 33.2 26.0 - 34.0 pg   MCHC 34.0 30.0 - 36.0 g/dL   RDW 91.4 (H) 78.2 - 95.6 %   Platelets 465 (H) 150 - 400 K/uL   nRBC 4.5 (H) 0.0 - 0.2 %   Neutrophils Relative % 56 %   Neutro Abs 6.6 1.7 - 7.7 K/uL   Lymphocytes Relative 33 %   Lymphs Abs 3.9 0.7 - 4.0 K/uL   Monocytes Relative 10 %   Monocytes  Absolute 1.2 (H) 0.1 - 1.0 K/uL   Eosinophils Relative 0 %   Eosinophils Absolute 0.0 0.0 - 0.5 K/uL   Basophils Relative 0 %   Basophils Absolute 0.0 0.0 - 0.1 K/uL   Immature Granulocytes 1 %   Abs Immature Granulocytes 0.06 0.00 - 0.07 K/uL   Polychromasia PRESENT    Sickle Cells PRESENT    Target Cells PRESENT   Reticulocytes     Status: Abnormal   Collection Time: 03/01/19 11:35 AM  Result Value Ref Range   Retic Ct Pct 23.0 (H) 0.4 - 3.1 %   RBC. 2.55 (L) 3.87 - 5.11 MIL/uL   Retic Count, Absolute 585.5 (H) 19.0 - 186.0 K/uL   Immature Retic Fract 24.9 (H) 2.3 - 15.9 %  I-Stat beta hCG blood, ED     Status: None   Collection Time: 03/01/19 11:40 AM  Result Value Ref Range   I-stat hCG, quantitative <5.0 <5 mIU/mL   Comment 3            Imaging results:  No results found.   Assessment & Plan: Patient admitted to sickle cell day infusion center for management of pain crisis.  Patient is opiate tolerant Initiate IV dilaudid PCA. Settings of 0.5 mg, 10 minute lockout, and 3 mg per hour IV fluids, 0.45% saline at 100 ml/hr Toradol 15 mg IV times one dose Tylenol 1000 mg by  mouth times one dose Review CBC with differential, complete metabolic panel, and reticulocytes as results become available. Baseline hemoglobin is Pain intensity will be reevaluated in context of functioning and relationship to baseline as care progress If pain intensity remains elevated and/or sudden change in hemodynamic stability transition to inpatient services for higher level of care.    Nolon Nations  APRN, MSN, FNP-C Patient Care [facility] [address] [place], [place] [address] [phone]   03/02/2019, 9:02 AM

## 2019-03-02 NOTE — Discharge Summary (Signed)
[facility] Discharge Summary   Patient ID: Brittany Foster MRN: [id number] DOB/AGE: 08/03/1985 33 y.o.  Admit date: 03/02/2019 Discharge date: 03/02/2019  Primary Care Physician:  Azzie Glatter, FNP  Admission Diagnoses:  Active Problems:   Sickle cell pain crisis [facility])   Discharge Diagnoses:   Sickle cell pain  Discharge Medications:  Allergies as of 03/02/2019      Reactions   Ultram [tramadol] Other (See Comments)   Reaction:  Seizures    Zofran [ondansetron Hcl] Nausea And Vomiting   Buprenorphine Hcl Hives, Other (See Comments)   Reaction:  Shaking    Fentanyl Hives   Morphine And Related Hives, Other (See Comments)   Reaction:  Shaking; tolerates hydromorphone   Tape Rash      Medication List    TAKE these medications   Deferasirox 360 MG Tabs Commonly known as: Jadenu Take 2 tablets (720 mg total) by mouth daily after breakfast.   diphenhydrAMINE 25 mg capsule Commonly known as: BENADRYL Take 25 mg by mouth every 6 (six) hours as needed for itching.   folic acid 1 MG tablet Commonly known as: FOLVITE Take 1 tablet (1 mg total) by mouth daily.   HYDROmorphone HCl 8 MG Tb24 Commonly known as: EXALGO Take 16 mg by mouth 2 (two) times daily as needed for pain.   hydroxyurea 500 MG capsule Commonly known as: HYDREA Take 1 capsule (500 mg total) by mouth daily.   medroxyPROGESTERone 150 MG/ML injection Commonly known as: DEPO-PROVERA Inject 150 mg into the muscle every 3 (three) months.   oxyCODONE-acetaminophen 10-325 MG tablet Commonly known as: Percocet Take 1 tablet by mouth every 4 (four) hours as needed for pain.   rivaroxaban 20 MG Tabs tablet Commonly known as: XARELTO Take 1 tablet (20 mg total) by mouth daily with supper.   Topiramate ER 100 MG Cp24 Commonly known as: TROKENDI XR Take 100 mg by mouth at bedtime.   traZODone 50 MG tablet Commonly known as: DESYREL Take 0.5-1 tablets (25-50 mg total) by mouth at  bedtime as needed for sleep.   Vitamin D (Ergocalciferol) 1.25 MG (50000 UT) Caps capsule Commonly known as: DRISDOL Take 1 capsule (50,000 Units total) by mouth every 7 (seven) days.        Consults:  None  Significant Diagnostic Studies:  DG Chest 2 View  Result Date: 02/20/2019 CLINICAL DATA:  33 year old female with intermittent chest wall pain. Sickle cell disease. Smoker. EXAM: CHEST - 2 VIEW COMPARISON:  Chest radiographs 04/22/2018 and earlier. FINDINGS: Right chest subclavian approach power port is new. Lung volumes and mediastinal contours are stable. Borderline cardiomegaly. Visualized tracheal air column is within normal limits. Decreased bilateral pulmonary interstitial opacity compared to February. No pneumothorax, pulmonary edema, pleural effusion or confluent pulmonary opacity. Negative visible osseous structures. Right upper quadrant cholecystectomy clips. Negative visible bowel gas pattern. IMPRESSION: No acute cardiopulmonary abnormality. Decreased pulmonary interstitial opacity since February and stable borderline cardiomegaly. Electronically Signed   By: Genevie Ann M.D.   On: 02/20/2019 15:59   History of present illness:  Brittany Foster,, a 33 year old female with a medical history significant for sickle cell disease, chronic pain, opiate dependence and, hospital overutilization, history of anemia of chronic disease, history of PE on Xarelto, and depression presents complaining of generalized pain that is consistent with her typical pain crisis.  Patient attributes crisis to changes in weather.  She was treated and evaluated in the ER yesterday and was advised to follow-up in the  sickle cell day infusion clinic for treatment on today.  Patient's care plan was initiated on yesterday in ER, she says that in treating her pain.  Pain intensity is 9/10 primarily to bilateral hips and upper and lower extremities.  She last had extended release Dilaudid and Percocet this a.m. without  sustained relief.  Patient is followed by pain management for chronic pain syndrome.  She denies headache, fever, chills, shortness of breath, chest pain, urinary symptoms, nausea, vomiting, or diarrhea.  No sick contacts, recent travel, or exposure to COVID-19.  [facility] Course: Patient admitted to sickle cell day infusion center for management of pain crisis. On laboratory values reviewed and did not warrant repeating on today. Pain managed with IV Dilaudid via PCA.  Settings of 0.5 mg, 10-minute lockout, and 3 mg/h. Toradol 15 mg IV x1 Tylenol 1000 mg by mouth x1 IV fluids, 0.45% saline at 100 mL/h Patient's pain intensity decreased to 7/10.  She does not warrant admission at this time.  Patient advised to follow-up with pain management and hematology as scheduled. She is alert, oriented, and ambulating without assistance. Patient will discharge home in a hemodynamically stable condition.  Discharge instructions:  Resume all home medications.   Follow up with PCP as previously  scheduled.   Discussed the importance of drinking 64 ounces of water daily, dehydration of red blood cells may lead further sickling.   Avoid all stressors that precipitate sickle cell pain crisis.     The patient was given clear instructions to go to ER or return to medical center if symptoms do not improve, worsen or new problems develop.     Physical Exam at Discharge:  BP 107/68 (BP Location: Right Arm)   Pulse 90   Temp 98.6 F (37 C) (Oral)   Resp 10   SpO2 100%  Physical Exam Constitutional:      Appearance: Normal appearance.  Eyes:     Pupils: Pupils are equal, round, and reactive to light.  Cardiovascular:     Rate and Rhythm: Normal rate and regular rhythm.     Pulses: Normal pulses.  Pulmonary:     Effort: Pulmonary effort is normal.  Abdominal:     General: Abdomen is flat. Bowel sounds are normal.  Skin:    General: Skin is warm.  Neurological:     General:  No focal deficit present.     Mental Status: She is alert. Mental status is at baseline.  Psychiatric:        Mood and Affect: Mood normal.        Thought Content: Thought content normal.        Judgment: Judgment normal.       Disposition at Discharge: Discharge disposition: 01-Home or Self Care       Discharge Orders: Discharge Instructions    Discharge patient   Complete by: As directed    Discharge disposition: 01-Home or Self Care   Discharge patient date: 03/02/2019      Condition at Discharge:   Stable  Time spent on Discharge:  Greater than 30 minutes.  Signed:  Nolon Nations  APRN, MSN, FNP-C Patient Care [facility] [address] [place], [place] [address] [phone]  03/02/2019, 4:08 PM

## 2019-03-02 NOTE — Progress Notes (Signed)
Patient admitted to the day infusion hospital for sickle cell pain. Initially, patient reported generalized pain rated 8/10. For pain management, patient placed on Dilaudid PCA, given 1000 mg Tylenol and hydrated with IV fluids. At discharge, patient rated pain at 7/10. Vital signs stable. Discharge instructions given. Patient alert, oriented and ambulatory at discharge.

## 2019-03-02 NOTE — BH Specialist Note (Signed)
Integrated Behavioral Health Referral Note  Reason for Referral: Mazella Deen is a 33 y.o. female  Pt was referred by self For: housing Pt reports the following concerns: unstably housed  Assessment: Met with patient at bedside in day hospital, as patient has not been seen in the day hospital since September if this year. Patient reported unstable living situation. Staying with her daughter's father in a motel at times. Patient reports having applied for several different housing options in [facility] but all have long waiting lists. Has limited income from [facility].   Plan: 1. Addressed today: Provided [place] housing resources, though patient ambivalent about moving to [place] based housing list and other resources. [employer] also remain closed, except for the elderly. Referred to social worker at [facility] for additional assistance with housing.   2. Referral: [facility], [facility]  3. Follow up: as needed  Estanislado Emms, [facility] [phone]

## 2019-03-13 ENCOUNTER — Telehealth (HOSPITAL_COMMUNITY): Payer: Self-pay | Admitting: *Deleted

## 2019-03-13 NOTE — Telephone Encounter (Signed)
Patient called requesting to come to the day hospital for sickle cell pain. Patient reports bilateral hip, leg and back pain rated 7/10. Reports last taking Percocet for pain at 4:00 am. COVID-19 screening done and patient denies all symptoms. Denies fever, chest pain, nausea, vomiting and diarrhea. Reports some abdominal pain which is consistent with her crisis. Admits to having transportation without driving self. [name], FNP notified. Currently the day hospital is at capacity so patient advised to hydrate with 64 ounces of water and continue to take pain medications around the clock as prescribed. Patient advised.

## 2019-03-14 ENCOUNTER — Telehealth (HOSPITAL_COMMUNITY): Payer: Self-pay | Admitting: *Deleted

## 2019-03-14 NOTE — Telephone Encounter (Signed)
Patient called requesting to come to the day hospital for sickle cell pain. Patient reports back, legs and hip pain rated 7/10. Reports last taking Percocet for pain at 5:00 am. COVID-19 screening done and patient denies all symptoms. Denies fever, chest pain, nausea, vomiting and diarrhea. Admits to having transportation without driving self. [name], FNP notified and advised patient that she can come in for treatment but her care plan will be followed. Patient advised and hung up the phone with no response as to whether or not she would come to clinic for treatment.

## 2019-04-14 NOTE — Telephone Encounter (Signed)
Error

## 2019-04-26 ENCOUNTER — Ambulatory Visit: Payer: Self-pay | Admitting: Family Medicine

## 2019-05-15 ENCOUNTER — Telehealth: Payer: Self-pay | Admitting: Family Medicine

## 2019-05-15 NOTE — Telephone Encounter (Signed)
Pt was called and reminded of there appointment 

## 2019-05-16 ENCOUNTER — Ambulatory Visit: Payer: Self-pay | Admitting: Family Medicine

## 2019-07-21 ENCOUNTER — Telehealth (HOSPITAL_COMMUNITY): Payer: Self-pay

## 2019-07-21 NOTE — Telephone Encounter (Signed)
1015 Pt called the [facility]) c/o sickle cell pain 8/10 in her legs and hips. Seh stated she last took percocet 10/325 at 0700 this morning. She stated she was last seen in the ER about "3-4 days ago". She denies fever, CP, n/v/d/abd pain, and all covid symptoms. She stated she would have a friend pick her up if she was admitted to the day hospital. RN informed the patient that she would speak to [name] [name], NP and return her call.

## 2019-07-21 NOTE — Telephone Encounter (Signed)
1034 RN called back patient and informed her that the Patient Care Center [facility]) was full today but informed her of what [name] [name], NP had said: to drink 64oz of water, take tylenol 1000mg  every 6 hours, and ibuprofen 800mg  every 8 hours if not allergic to those medications, in addition to her prescribed pain medications.

## 2019-08-03 ENCOUNTER — Telehealth: Payer: Self-pay | Admitting: Family Medicine

## 2019-08-03 MED ORDER — DIPHENHYDRAMINE HCL 25 MG PO CAPS
25.00 | ORAL_CAPSULE | ORAL | Status: DC
Start: ? — End: 2019-08-03

## 2019-08-03 MED ORDER — GENERIC EXTERNAL MEDICATION
Status: DC
Start: ? — End: 2019-08-03

## 2019-08-03 MED ORDER — OXYCODONE HCL 10 MG PO TABS
10.00 | ORAL_TABLET | ORAL | Status: DC
Start: ? — End: 2019-08-03

## 2019-08-03 MED ORDER — HYDROMORPHONE HCL 1 MG/ML IJ SOLN
1.00 | INTRAMUSCULAR | Status: DC
Start: ? — End: 2019-08-03

## 2019-08-03 MED ORDER — SODIUM CHLORIDE 0.9 % IV SOLN
100.00 | INTRAVENOUS | Status: DC
Start: ? — End: 2019-08-03

## 2019-08-03 MED ORDER — HYDROXYUREA 500 MG PO CAPS
500.00 | ORAL_CAPSULE | ORAL | Status: DC
Start: 2019-08-04 — End: 2019-08-03

## 2019-08-03 MED ORDER — HYDROMORPHONE HCL ER 12 MG PO TB24
12.00 | ORAL_TABLET | ORAL | Status: DC
Start: 2019-08-03 — End: 2019-08-03

## 2019-08-03 MED ORDER — POLYETHYLENE GLYCOL 3350 17 GM/SCOOP PO POWD
17.00 | ORAL | Status: DC
Start: ? — End: 2019-08-03

## 2019-08-03 MED ORDER — FOLIC ACID 1 MG PO TABS
1.00 | ORAL_TABLET | ORAL | Status: DC
Start: 2019-08-04 — End: 2019-08-03

## 2019-08-03 MED ORDER — HYDROMORPHONE HCL 2 MG PO TABS
2.00 | ORAL_TABLET | ORAL | Status: DC
Start: ? — End: 2019-08-03

## 2019-08-03 MED ORDER — RIVAROXABAN 20 MG PO TABS
20.00 | ORAL_TABLET | ORAL | Status: DC
Start: 2019-08-04 — End: 2019-08-03

## 2019-08-03 MED ORDER — ENOXAPARIN SODIUM 40 MG/0.4ML ~~LOC~~ SOLN
40.00 | SUBCUTANEOUS | Status: DC
Start: 2019-08-03 — End: 2019-08-03

## 2019-08-03 MED ORDER — ALUM & MAG HYDROXIDE-SIMETH 200-200-20 MG/5ML PO SUSP
30.00 | ORAL | Status: DC
Start: ? — End: 2019-08-03

## 2019-08-03 MED ORDER — SODIUM CHLORIDE 0.9 % IV SOLN
10.00 | INTRAVENOUS | Status: DC
Start: ? — End: 2019-08-03

## 2019-08-03 NOTE — Telephone Encounter (Signed)
  Message left for a call back if need.   If Dr. Sharl Ma calls back, please have the in-office provider speak to the Dr. Sharl Ma. Or Dorene Grebe if she is here.

## 2019-08-03 NOTE — Telephone Encounter (Signed)
A doctor called Dr. Sharl Ma called inquiring about pt's condition. You can call her back at [phone].

## 2019-08-25 ENCOUNTER — Telehealth (HOSPITAL_COMMUNITY): Payer: Self-pay | Admitting: General Practice

## 2019-08-25 NOTE — Telephone Encounter (Signed)
Patient called, RN told patient to "hold". Patient hung up. RN called the patient back, there was no answer and there was no voice mail.

## 2019-08-30 ENCOUNTER — Telehealth (HOSPITAL_COMMUNITY): Payer: Self-pay

## 2019-08-30 NOTE — Telephone Encounter (Signed)
1540 - Brittany Foster called the [facility] today (6/30) requesting to be seen.   She stated that her pain was 8 on a 1-10 scale.  She stated that she was last seen in the ED on Monday (6/28).  Also stated " the meds [name] has me on are not working"   Per her she took Oxy 10/325 at 0400 and hydro 20 mg at 0700.   Spoke with [name] who told me to instruct [name] that we don't routinely start PCA in the day hospital setting. She could go to see her PCP or pain management center or come and we could tx her.    0867- this Clinical research associate returned Brittany Foster's call to inform her of her options, and before I could relay all of the information to her, she disconnected the phone call between [place].

## 2019-09-09 ENCOUNTER — Observation Stay (HOSPITAL_COMMUNITY)
Admission: AD | Admit: 2019-09-09 | Discharge: 2019-09-10 | Disposition: A | Discharge status: Home / Self Care | Payer: Medicaid Other | Source: Other Acute Inpatient Hospital | Attending: Internal Medicine | Admitting: Internal Medicine

## 2019-09-09 DIAGNOSIS — Z888 Allergy status to other drugs, medicaments and biological substances status: Secondary | ICD-10-CM | POA: Insufficient documentation

## 2019-09-09 DIAGNOSIS — Z79899 Other long term (current) drug therapy: Secondary | ICD-10-CM | POA: Insufficient documentation

## 2019-09-09 DIAGNOSIS — G894 Chronic pain syndrome: Secondary | ICD-10-CM

## 2019-09-09 DIAGNOSIS — Z885 Allergy status to narcotic agent status: Secondary | ICD-10-CM | POA: Insufficient documentation

## 2019-09-09 DIAGNOSIS — Z20822 Contact with and (suspected) exposure to covid-19: Secondary | ICD-10-CM | POA: Diagnosis not present

## 2019-09-09 DIAGNOSIS — Z86711 Personal history of pulmonary embolism: Secondary | ICD-10-CM | POA: Insufficient documentation

## 2019-09-09 DIAGNOSIS — D638 Anemia in other chronic diseases classified elsewhere: Secondary | ICD-10-CM | POA: Diagnosis not present

## 2019-09-09 DIAGNOSIS — G8929 Other chronic pain: Secondary | ICD-10-CM | POA: Insufficient documentation

## 2019-09-09 DIAGNOSIS — D5700 Hb-SS disease with crisis, unspecified: Secondary | ICD-10-CM | POA: Diagnosis not present

## 2019-09-09 DIAGNOSIS — Z7901 Long term (current) use of anticoagulants: Secondary | ICD-10-CM | POA: Insufficient documentation

## 2019-09-09 MED ORDER — SENNOSIDES-DOCUSATE SODIUM 8.6-50 MG PO TABS
1.0000 | ORAL_TABLET | Freq: Two times a day (BID) | ORAL | Status: DC
  Filled 2019-09-09 (×2): qty 1

## 2019-09-09 MED ORDER — HYDROMORPHONE 1 MG/ML IV SOLN
INTRAVENOUS | Status: DC
  Administered 2019-09-09: 30 mg via INTRAVENOUS
  Filled 2019-09-09: qty 30

## 2019-09-09 MED ORDER — BISACODYL 5 MG PO TBEC: 5.0000 mg | DELAYED_RELEASE_TABLET | Freq: Every day | ORAL | Status: DC | PRN

## 2019-09-09 MED ORDER — HYDROMORPHONE 1 MG/ML IV SOLN
INTRAVENOUS | Status: DC
  Administered 2019-09-09: 2 mg via INTRAVENOUS
  Administered 2019-09-10: 9.5 mg via INTRAVENOUS
  Administered 2019-09-10: 5 mg via INTRAVENOUS

## 2019-09-09 MED ORDER — DIPHENHYDRAMINE HCL 25 MG PO CAPS: 25.0000 mg | ORAL_CAPSULE | ORAL | Status: DC | PRN

## 2019-09-09 MED ORDER — HYDROMORPHONE HCL ER 8 MG PO TB24
16.0000 mg | ORAL_TABLET | Freq: Two times a day (BID) | ORAL | Status: DC
  Administered 2019-09-09 – 2019-09-10 (×2): 16 mg via ORAL
  Filled 2019-09-09: qty 2

## 2019-09-09 MED ORDER — TOPIRAMATE ER 100 MG PO SPRINKLE CAP24
100.0000 mg | EXTENDED_RELEASE_CAPSULE | Freq: Every day | ORAL | Status: DC
  Administered 2019-09-09: 100 mg via ORAL
  Filled 2019-09-09: qty 1

## 2019-09-09 MED ORDER — TOPIRAMATE ER 100 MG PO CAP24: 100.0000 mg | ORAL_CAPSULE | Freq: Every day | ORAL | Status: DC

## 2019-09-09 MED ORDER — RIVAROXABAN 20 MG PO TABS
20.0000 mg | ORAL_TABLET | Freq: Every day | ORAL | Status: DC
  Administered 2019-09-09: 20 mg via ORAL
  Filled 2019-09-09: qty 1

## 2019-09-09 MED ORDER — FOLIC ACID 1 MG PO TABS
1.0000 mg | ORAL_TABLET | Freq: Every day | ORAL | Status: DC
  Administered 2019-09-10: 1 mg via ORAL
  Filled 2019-09-09: qty 1

## 2019-09-09 MED ORDER — SODIUM CHLORIDE 0.9% FLUSH: 9.0000 mL | INTRAVENOUS | Status: DC | PRN

## 2019-09-09 MED ORDER — HYDROXYZINE HCL 25 MG PO TABS: 25.0000 mg | ORAL_TABLET | ORAL | Status: DC | PRN

## 2019-09-09 MED ORDER — NALOXONE HCL 0.4 MG/ML IJ SOLN: 0.4000 mg | INTRAMUSCULAR | Status: DC | PRN

## 2019-09-09 MED ORDER — KETOROLAC TROMETHAMINE 30 MG/ML IJ SOLN
30.0000 mg | Freq: Four times a day (QID) | INTRAMUSCULAR | Status: DC
  Administered 2019-09-09 – 2019-09-10 (×2): 30 mg via INTRAVENOUS
  Filled 2019-09-09 (×3): qty 1

## 2019-09-09 MED ORDER — SODIUM CHLORIDE 0.9 % IV SOLN
25.0000 mg | INTRAVENOUS | Status: DC | PRN
  Administered 2019-09-10 (×2): 25 mg via INTRAVENOUS
  Filled 2019-09-09: qty 25
  Filled 2019-09-09: qty 0.5
  Filled 2019-09-09: qty 25

## 2019-09-09 MED ORDER — POLYETHYLENE GLYCOL 3350 17 G PO PACK: 17.0000 g | PACK | Freq: Every day | ORAL | Status: DC | PRN

## 2019-09-09 MED ORDER — DEXTROSE-NACL 5-0.45 % IV SOLN: INTRAVENOUS | Status: DC

## 2019-09-09 MED ORDER — ENOXAPARIN SODIUM 40 MG/0.4ML ~~LOC~~ SOLN: 40.0000 mg | SUBCUTANEOUS | Status: DC

## 2019-09-09 MED ORDER — HYDROXYUREA 500 MG PO CAPS
500.0000 mg | ORAL_CAPSULE | Freq: Every day | ORAL | Status: DC
  Administered 2019-09-10: 500 mg via ORAL
  Filled 2019-09-09: qty 1

## 2019-09-09 NOTE — H&P (Signed)
Evalin Shawhan LYY:[id number] DOB: 1985/08/14 DOA: 09/09/2019     PCP: Kallie Locks, FNP   Outpatient Specialists:  patient care center  Patient arrived to ER on  at  Referred by Attending Therisa Doyne, MD   Patient coming from: home  With family    Chief Complaint:  Sickle cell pain crisis HPI: Charley Lafrance is a 34 y.o. female with medical history significant of sickle cell anemia, chronic pain, and PE on Xarelto    Presented with   pain in hips and legs similar to prior crises. Hgb is 9.1, retic count 732, bili 3.4 and mild elevation in transaminases similar to prior labs. CXR unremarkable.  Patient was treated with fentanyl multiple doses at outside facility but continues to have significant pain at which point she was transferred to [facility]. pateint lives in [place] Percocet 10/325 PRN And Dilaudid 12mg  ER BID Patient reports she has been taking her meds and her pain continued she took phenergan but it did not help she had an episode of vomiting.     Infectious risk factors:  Reports none    Has  NOt been vaccinated against COVID    Initial COVID TEST   in house  PCR testing  Pending  Lab Results  Component Value Date   SARSCOV2NAA NEGATIVE 08/07/2018     Regarding pertinent Chronic problems:     Hx of PE 6 months ago on Xarelto       Chronic anemia - baseline hg Hemoglobin & Hematocrit  Recent Labs    11/14/18 1119 03/01/19 1135  HGB 8.3* 8.4*     Hospitalist was called for admission for Sickle cell pain crysis      Cr   Stable  Lab Results  Component Value Date   CREATININE 0.31 (L) 03/01/2019   CREATININE 0.38 (L) 11/14/2018   CREATININE 0.53 09/01/2018    No results for input(s): AST, ALT, ALKPHOS, BILITOT, PROT, ALBUMIN in the last 168 hours. Lab Results  Component Value Date   CALCIUM 9.2 03/01/2019   PHOS 3.7 02/20/2017     WBC        Component Value Date/Time   WBC 11.8 (H) 03/01/2019 1135   ANC     Component Value Date/Time   NEUTROABS 6.6 03/01/2019 1135   NEUTROABS 5.9 08/19/2018 1014   Plt: Lab Results  Component Value Date   PLT 465 (H) 03/01/2019      COVID-19 Labs  No results for input(s): DDIMER, FERRITIN, LDH, CRP in the last 72 hours.  Lab Results  Component Value Date   SARSCOV2NAA NEGATIVE 08/07/2018    HG/HCT  stable,      Component Value Date/Time   HGB 8.4 (L) 03/01/2019 1135   HGB 7.7 (L) 08/19/2018 1014   HCT 24.7 (L) 03/01/2019 1135   HCT 25.5 (L) 08/19/2018 1014      CXR - NON acute    ED Triage Vitals  Enc Vitals Group     BP      Pulse      Resp      Temp      Temp src      SpO2      Weight      Height      Head Circumference      Peak Flow      Pain Score      Pain Loc      Pain Edu?  Excl. in GC?   ZOXW(96)@TMAX(24)@       Latest  There were no vitals taken for this visit.    Review of Systems:    Pertinent positives include: pain generalized  Constitutional:  No weight loss, night sweats, Fevers, chills, fatigue, weight loss  HEENT:  No headaches, Difficulty swallowing,Tooth/dental problems,Sore throat,  No sneezing, itching, ear ache, nasal congestion, post nasal drip,  Cardio-vascular:  No chest pain, Orthopnea, PND, anasarca, dizziness, palpitations.no Bilateral lower extremity swelling  GI:  No heartburn, indigestion, abdominal pain, nausea, vomiting, diarrhea, change in bowel habits, loss of appetite, melena, blood in stool, hematemesis Resp:  no shortness of breath at rest. No dyspnea on exertion, No excess mucus, no productive cough, No non-productive cough, No coughing up of blood.No change in color of mucus.No wheezing. Skin:  no rash or lesions. No jaundice GU:  no dysuria, change in color of urine, no urgency or frequency. No straining to urinate.  No flank pain.  Musculoskeletal:  No joint pain or no joint swelling. No decreased range of motion. No back pain.  Psych:  No change in mood or affect. No  depression or anxiety. No memory loss.  Neuro: no localizing neurological complaints, no tingling, no weakness, no double vision, no gait abnormality, no slurred speech, no confusion  All systems reviewed and apart from HOPI all are negative  Past Medical History:   Past Medical History:  Diagnosis Date  . Anemia   . Depression, major, recurrent (HCC)   . Migraines   . Sickle cell anemia (HCC)       Past Surgical History:  Procedure Laterality Date  . CESAREAN SECTION    . CHOLECYSTECTOMY  2000  . IR GENERIC HISTORICAL  10/08/2015   IR US GUIDE VASC ACCESS RIGHT 10/08/2015 Simonne ComeJohn Watts, MD [facility]INTER[facility]  . IR GENERIC HISTORICAL  10/08/2015   IR FLUORO GUIDE CV LINE RIGHT 10/08/2015 Simonne ComeJohn Watts, MD [facility]  . IR REMOVAL TUN CV CATH W/O FL  03/09/2017  . MULTIPLE TOOTH EXTRACTIONS N/A   . port a cath placement Right    about 6-7 years ago  . removal of porta cath Right 09/11/15  . TUBAL LIGATION      Social History:  Ambulatory   Independently      reports that she has been smoking. She has been smoking about 1.00 pack per day. She uses smokeless tobacco. She reports that she does not drink alcohol and does not use drugs.   Family History:   Family History  Problem Relation Age of Onset  . Sickle cell trait Father   . Sickle cell trait Mother   . Sickle cell anemia Other     Allergies: Allergies  Allergen Reactions  . Ultram [Tramadol] Other (See Comments)    Reaction:  Seizures   . Zofran [Ondansetron Hcl] Nausea And Vomiting  . Buprenorphine Hcl Hives and Other (See Comments)    Reaction:  Shaking   . Fentanyl Hives  . Morphine And Related Hives and Other (See Comments)    Reaction:  Shaking; tolerates hydromorphone  . Tape Rash     Prior to Admission medications   Medication Sig Start Date End Date Taking? Authorizing Provider  Deferasirox (JADENU) 360 MG TABS Take 2 tablets (720 mg total) by mouth daily after breakfast. 02/20/19   Kallie LocksStroud, Natalie M, FNP    diphenhydrAMINE (BENADRYL) 25 mg capsule Take 25 mg by mouth every 6 (six) hours as needed for itching.  [provider]  folic acid (FOLVITE) 1 MG tablet Take 1 tablet (1 mg total) by mouth daily. 02/20/19   Kallie Locks, FNP  HYDROmorphone HCl (EXALGO) 8 MG TB24 Take 16 mg by mouth 2 (two) times daily as needed for pain. 02/07/19   [provider]  hydroxyurea (HYDREA) 500 MG capsule Take 1 capsule (500 mg total) by mouth daily. 02/20/19   Kallie Locks, FNP  medroxyPROGESTERone (DEPO-PROVERA) 150 MG/ML injection Inject 150 mg into the muscle every 3 (three) months.     [provider]  oxyCODONE-acetaminophen (PERCOCET) 10-325 MG tablet Take 1 tablet by mouth every 4 (four) hours as needed for pain. 03/11/17   Altha Harm, MD  rivaroxaban (XARELTO) 20 MG TABS tablet Take 1 tablet (20 mg total) by mouth daily with supper. 02/20/19   Kallie Locks, FNP  Topiramate ER (TROKENDI XR) 100 MG CP24 Take 100 mg by mouth at bedtime. 08/19/18   Kallie Locks, FNP  traZODone (DESYREL) 50 MG tablet Take 0.5-1 tablets (25-50 mg total) by mouth at bedtime as needed for sleep. 02/20/19   Kallie Locks, FNP  Vitamin D, Ergocalciferol, (DRISDOL) 1.25 MG (50000 UT) CAPS capsule Take 1 capsule (50,000 Units total) by mouth every 7 (seven) days. 08/19/18   Kallie Locks, FNP   Physical Exam: Vitals with BMI 03/02/2019 03/02/2019 03/02/2019  Height - - -  Weight - - -  BMI - - -  Systolic 107 103 932  Diastolic 68 61 64  Pulse 90 83 -  Some encounter information is confidential and restricted. Go to Review Flowsheets activity to see all data.     1. General:  in No  Acute distress   Chronically ill -appearing 2. Psychological: Alert and   Oriented 3. Head/ENT:      Dry Mucous Membranes                          Head Non traumatic, neck supple                            Poor Dentition 4. SKIN:  decreased Skin turgor,  Skin clean Dry and intact no  rash 5. Heart: Regular rate and rhythm no  Murmur, no Rub or gallop 6. Lungs:  no wheezes or crackles   7. Abdomen: Soft,  non-tender, Non distended  bowel sounds present 8. Lower extremities: no clubbing, cyanosis, no  edema 9. Neurologically Grossly intact, moving all 4 extremities equally   10. MSK: Normal range of motion     Cultures:    Component Value Date/Time   SDES CATH TIP PICC 03/09/2017 1211   SPECREQUEST NONE 03/09/2017 1211   CULT  03/09/2017 1211    NO GROWTH 3 DAYS Performed at [facility], [address], [place], [place] [address]    REPTSTATUS 03/12/2017 FINAL 03/09/2017 1211     Radiological Exams on Admission: No results found.  Chart has been reviewed   Assessment/Plan   34 y.o. female with medical history significant of sickle cell anemia, chronic pain, and PE on Xarelto    Admitted for sickle cell crisis  Present on Admission: . Sickle cell crisis (HCC) - - will admit per sickle cell protocol,    control pain,    hydrate with IVF D5 .45% Saline @ 100 mls/hour,    Weight based Dilaudid PCA for opioid tolerant  patients.    continue hydroxyurea and folic acid   Transfuse as needed if Hg drops significantly below baseline.    No evidence of acute chest at this time   Sickle cell team to take over management in AM  . Anemia of chronic disease - no indication for blood transfusion at this time  . Chronic pain syndrome - will need to continue home meds   . History of pulmonary embolism -continue xarelto   Other plan as per orders.  DVT prophylaxis:     Lovenox       Code Status:    Code Status: Prior FULL CODE  as per patient  I had personally discussed CODE STATUS with patient      Family Communication:   Family not at  Bedside    Disposition Plan:        To home once workup is complete and patient is stable   Following barriers for discharge:                               Pain controlled with PO medications                                   Consults called: none    Admission status:  ED Disposition    None       inpatient     I Expect 2 midnight stay secondary to severity of patient's current illness need for inpatient interventions justified by the following:  Severe lab/radiological/exam abnormalities including:    sickle cell pain crisis  and extensive comorbidities including:  Chronic pain   * sickle cell disease  .   Marland Kitchen Chronic anticoagulation  That are currently affecting medical management.   I expect  patient to be hospitalized for 2 midnights requiring inpatient medical care.  Patient is at high risk for adverse outcome (such as loss of life or disability) if not treated.  Indication for inpatient stay as follows:    severe pain requiring acute inpatient management,    Need for  IV pain medications,     Level of care  medical floor      Lab Results  Component Value Date   SARSCOV2NAA NEGATIVE 08/07/2018     Precautions: admitted as asymptomatic screening protocol   PPE: Used by the provider:   N95   eye Goggles,  Gloves     Lyann Hagstrom 09/10/2019, 12:05 AM    [facility]     after 2 AM please page floor coverage PA If 7AM-7PM, please contact the day team taking care of the patient using [email or web address]   Patient was evaluated in the context of the global COVID-19 pandemic, which necessitated consideration that the patient might be at risk for infection with the SARS-CoV-2 virus that causes COVID-19. Institutional protocols and algorithms that pertain to the evaluation of patients at risk for COVID-19 are in a state of rapid change based on information released by regulatory bodies including the CDC and federal and state organizations. These policies and algorithms were followed during the patient's care.

## 2019-09-10 ENCOUNTER — Other Ambulatory Visit: Payer: Self-pay

## 2019-09-10 ENCOUNTER — Encounter (HOSPITAL_COMMUNITY): Payer: Self-pay | Admitting: Internal Medicine

## 2019-09-10 DIAGNOSIS — D638 Anemia in other chronic diseases classified elsewhere: Secondary | ICD-10-CM | POA: Diagnosis not present

## 2019-09-10 DIAGNOSIS — G894 Chronic pain syndrome: Secondary | ICD-10-CM | POA: Diagnosis not present

## 2019-09-10 DIAGNOSIS — D5700 Hb-SS disease with crisis, unspecified: Secondary | ICD-10-CM | POA: Diagnosis not present

## 2019-09-10 DIAGNOSIS — Z86711 Personal history of pulmonary embolism: Secondary | ICD-10-CM | POA: Diagnosis not present

## 2019-09-10 LAB — URINALYSIS, ROUTINE W REFLEX MICROSCOPIC
Bacteria, UA: NONE SEEN
Bilirubin Urine: NEGATIVE
Glucose, UA: NEGATIVE mg/dL
Ketones, ur: NEGATIVE mg/dL
Nitrite: NEGATIVE
Protein, ur: NEGATIVE mg/dL
Specific Gravity, Urine: 1.014 (ref 1.005–1.030)
pH: 6 (ref 5.0–8.0)

## 2019-09-10 LAB — COMPREHENSIVE METABOLIC PANEL
ALT: 53 U/L — ABNORMAL HIGH (ref 0–44)
AST: 100 U/L — ABNORMAL HIGH (ref 15–41)
Albumin: 3.7 g/dL (ref 3.5–5.0)
Alkaline Phosphatase: 177 U/L — ABNORMAL HIGH (ref 38–126)
Anion gap: 8 (ref 5–15)
BUN: 8 mg/dL (ref 6–20)
CO2: 19 mmol/L — ABNORMAL LOW (ref 22–32)
Calcium: 8.7 mg/dL — ABNORMAL LOW (ref 8.9–10.3)
Chloride: 114 mmol/L — ABNORMAL HIGH (ref 98–111)
Creatinine, Ser: 0.44 mg/dL (ref 0.44–1.00)
GFR calc Af Amer: >60 mL/min (ref >60)
GFR calc non Af Amer: >60 mL/min (ref >60)
Glucose, Bld: 146 mg/dL — ABNORMAL HIGH (ref 70–99)
Potassium: 3 mmol/L — ABNORMAL LOW (ref 3.5–5.1)
Sodium: 141 mmol/L (ref 135–145)
Total Bilirubin: 3.1 mg/dL — ABNORMAL HIGH (ref 0.3–1.2)
Total Protein: 7.4 g/dL (ref 6.5–8.1)

## 2019-09-10 LAB — RETICULOCYTES
Immature Retic Fract: 11.5 % (ref 2.3–15.9)
RBC.: 2.49 MIL/uL — ABNORMAL LOW (ref 3.87–5.11)
Retic Count, Absolute: 143.9 10*3/uL (ref 19.0–186.0)
Retic Ct Pct: 5.8 % — ABNORMAL HIGH (ref 0.4–3.1)

## 2019-09-10 LAB — HIV ANTIBODY (ROUTINE TESTING W REFLEX): HIV Screen 4th Generation wRfx: NONREACTIVE

## 2019-09-10 LAB — CBC WITH DIFFERENTIAL/PLATELET
Abs Immature Granulocytes: 0.04 10*3/uL (ref 0.00–0.07)
Basophils Absolute: 0.1 10*3/uL (ref 0.0–0.1)
Basophils Relative: 1 %
Eosinophils Absolute: 0.1 10*3/uL (ref 0.0–0.5)
Eosinophils Relative: 1 %
HCT: 22.7 % — ABNORMAL LOW (ref 36.0–46.0)
Hemoglobin: 7.8 g/dL — ABNORMAL LOW (ref 12.0–15.0)
Immature Granulocytes: 0 %
Lymphocytes Relative: 46 %
Lymphs Abs: 5.1 10*3/uL — ABNORMAL HIGH (ref 0.7–4.0)
MCH: 31.7 pg (ref 26.0–34.0)
MCHC: 34.4 g/dL (ref 30.0–36.0)
MCV: 92.3 fL (ref 80.0–100.0)
Monocytes Absolute: 1.2 10*3/uL — ABNORMAL HIGH (ref 0.1–1.0)
Monocytes Relative: 10 %
Neutro Abs: 4.8 10*3/uL (ref 1.7–7.7)
Neutrophils Relative %: 42 %
Platelets: 298 10*3/uL (ref 150–400)
RBC: 2.46 MIL/uL — ABNORMAL LOW (ref 3.87–5.11)
RDW: 19.8 % — ABNORMAL HIGH (ref 11.5–15.5)
WBC: 11.3 10*3/uL — ABNORMAL HIGH (ref 4.0–10.5)
nRBC: 0.3 % — ABNORMAL HIGH (ref 0.0–0.2)

## 2019-09-10 LAB — PHOSPHORUS: Phosphorus: 3.7 mg/dL (ref 2.5–4.6)

## 2019-09-10 LAB — SARS CORONAVIRUS 2 BY RT PCR (HOSPITAL ORDER, PERFORMED IN ~~LOC~~ HOSPITAL LAB): SARS Coronavirus 2: NEGATIVE

## 2019-09-10 LAB — RAPID URINE DRUG SCREEN, HOSP PERFORMED
Amphetamines: NOT DETECTED
Barbiturates: NOT DETECTED
Benzodiazepines: NOT DETECTED
Cocaine: NOT DETECTED
Opiates: POSITIVE — AB
Tetrahydrocannabinol: NOT DETECTED

## 2019-09-10 LAB — MAGNESIUM: Magnesium: 1.9 mg/dL (ref 1.7–2.4)

## 2019-09-10 NOTE — Progress Notes (Signed)
Patient was informed that her PCA was being discontinued. Patient immediately began cussing and yelling demanding that Dr. Hyman Hopes come speak with her. This nurse explained that I was taking away the PCA and would call Dr. Hyman Hopes. Dr. Hyman Hopes at bedside with this writer. Patient continues to scream and cuss. Patient port was deaccessed. Patient left AMA, refusing to sign AMA paperwork. MD on unit and aware.

## 2019-09-15 NOTE — Discharge Summary (Signed)
Physician Discharge Summary  Brittany Foster OIZ:[id number] DOB: 1985/08/04 DOA: 09/09/2019  PCP: Azzie Glatter, FNP  Admit date: 09/09/2019  Discharge date: Left AMA on 09/10/2019 Discharge Diagnoses:  Active Problems:   Anemia of chronic disease   Chronic pain syndrome   Sickle cell pain crisis (HCC)   Sickle cell crisis (HCC)   History of pulmonary embolism  Disposition:  Patient left AMA  Diet: Regular Wt Readings from Last 3 Encounters:  09/10/19 61.1 kg  03/01/19 58.1 kg  02/20/19 58.3 kg   History of present illness:  Brittany Foster is a 34 y.o. female with medical history significant of sickle cell anemia, chronic pain, and PE on Xarelto. Presented with  pain in hips and legs similar to prior crises. Hgb is 9.1, retic count 732, bili 3.4 and mild elevation in transaminases similar to prior labs. CXR unremarkable.  Patient was treated with fentanyl multiple doses at outside facility but continues to have significant pain at which point she was transferred to [facility]. pateint lives in [place] 10/325 PRN And Dilaudid '12mg'$  ER BID Patient reports she has been taking her meds and her pain continued she took phenergan but it did not help she had an episode of vomiting.   Hospital Course:  Patient was discharged from [facility] yesterday after about 5 days on admission, she left [facility] to another hospital in [facility] where she was referred to [facility] for possible admission.  Prior to this time patient was admitted for about 17 days at [facility], prior to which she was at a hospital in [place] also for admission.  During this admission patient had been placed on IV Dilaudid via PCA and other adjunct therapy per sickle cell pain management protocol.  Patient was expecting to get IV hydromorphone 2 mg every 2 hours and IV Benadryl.  This is not an acute crisis as evidenced by patient's presentation of bilateral hip pain only, and her laboratory investigations were  pretty much at baseline.  So we explained to patient the treatment plan with change from IV Dilaudid via PCA to mostly oral pain medications and possible as needed Dilaudid for breakthrough pain.  She became belligerent, abusive, very loud, using profane languages and cuss words.  She immediately de-access her port in a nonsterile fashion and stormed out of the hospital bed, packed all her belongings and left the hospital [place]. Of note, patient was hemodynamically stable throughout this admission.  Discharge Exam: Vitals:   09/10/19 0741 09/10/19 1034  BP:  93/62  Pulse:  65  Resp: 14 14  Temp:  97.6 F (36.4 C)  SpO2: 99%    Vitals:   09/10/19 0423 09/10/19 0608 09/10/19 0741 09/10/19 1034  BP:  102/68  93/62  Pulse:  70  65  Resp: '15 15 14 14  '$ Temp:  97.8 F (36.6 C)  97.6 F (36.4 C)  TempSrc:  Oral  Oral  SpO2: 99% 90% 99%   Weight:  61.1 kg      General appearance : Awake, alert, not in any distress. Speech Clear. Not toxic looking HEENT: Atraumatic and Normocephalic, pupils equally reactive to light and accomodation Neck: Supple, no JVD. No cervical lymphadenopathy.  Chest: Good air entry bilaterally, no added sounds  CVS: S1 S2 regular, no murmurs.  Abdomen: Bowel sounds present, Non tender and not distended with no gaurding, rigidity or rebound. Extremities: B/L Lower Ext shows no edema, both legs are warm to touch Neurology: Awake  alert, and oriented X 3, CN II-XII intact, Non focal Skin: No Rash  Discharge Instructions   Allergies as of 09/10/2019      Reactions   Ultram [tramadol] Other (See Comments)   Reaction:  Seizures    Zofran [ondansetron Hcl] Nausea And Vomiting   Buprenorphine Hcl Hives, Other (See Comments)   Reaction:  Shaking    Fentanyl Hives   Morphine And Related Hives, Other (See Comments)   Reaction:  Shaking; tolerates hydromorphone   Tape Rash      Medication List    ASK your doctor about these medications    Deferasirox 360 MG Tabs Commonly known as: Jadenu Take 2 tablets (720 mg total) by mouth daily after breakfast.   diphenhydrAMINE 25 mg capsule Commonly known as: BENADRYL Take 25 mg by mouth every 6 (six) hours as needed for itching.   folic acid 1 MG tablet Commonly known as: FOLVITE Take 1 tablet (1 mg total) by mouth daily.   HYDROmorphone HCl 8 MG Tb24 Commonly known as: EXALGO Take 16 mg by mouth 2 (two) times daily as needed for pain.   hydroxyurea 500 MG capsule Commonly known as: HYDREA Take 1 capsule (500 mg total) by mouth daily.   medroxyPROGESTERone 150 MG/ML injection Commonly known as: DEPO-PROVERA Inject 150 mg into the muscle every 3 (three) months.   Narcan 4 MG/0.1ML Liqd nasal spray kit Generic drug: naloxone Place 1 spray into the nose once.   oxyCODONE-acetaminophen 10-325 MG tablet Commonly known as: Percocet Take 1 tablet by mouth every 4 (four) hours as needed for pain.   promethazine 25 MG tablet Commonly known as: PHENERGAN Take 25 mg by mouth every 6 (six) hours as needed for nausea.   rivaroxaban 20 MG Tabs tablet Commonly known as: XARELTO Take 1 tablet (20 mg total) by mouth daily with supper.   traZODone 50 MG tablet Commonly known as: DESYREL Take 0.5-1 tablets (25-50 mg total) by mouth at bedtime as needed for sleep.   Vitamin D (Ergocalciferol) 1.25 MG (50000 UNIT) Caps capsule Commonly known as: DRISDOL Take 1 capsule (50,000 Units total) by mouth every 7 (seven) days.       The results of significant diagnostics from this hospitalization (including imaging, microbiology, ancillary and laboratory) are listed below for reference.    Significant Diagnostic Studies: No results found.  Microbiology: Recent Results (from the past 240 hour(s))  SARS Coronavirus 2 by RT PCR (hospital order, performed in [facility] hospital lab) Nasopharyngeal Nasopharyngeal Swab     Status: None   Collection Time: 09/09/19 11:58 PM    Specimen: Nasopharyngeal Swab  Result Value Ref Range Status   SARS Coronavirus 2 NEGATIVE NEGATIVE Final    Comment: (NOTE) SARS-CoV-2 target nucleic acids are NOT DETECTED.  The SARS-CoV-2 RNA is generally detectable in upper and lower respiratory specimens during the acute phase of infection. The lowest concentration of SARS-CoV-2 viral copies this assay can detect is 250 copies / mL. A negative result does not preclude SARS-CoV-2 infection and should not be used as the sole basis for treatment or other patient management decisions.  A negative result may occur with improper specimen collection / handling, submission of specimen other than nasopharyngeal swab, presence of viral mutation(s) within the areas targeted by this assay, and inadequate number of viral copies (<250 copies / mL). A negative result must be combined with clinical observations, patient history, and epidemiological information.  Fact Sheet for Patients:   StrictlyIdeas.no  Fact Sheet for  Healthcare Providers: BankingDealers.co.za  This test is not yet approved or  cleared by the [place] and has been authorized for detection and/or diagnosis of SARS-CoV-2 by FDA under an Emergency Use Authorization (EUA).  This EUA will remain in effect (meaning this test can be used) for the duration of the COVID-19 declaration under Section 564(b)(1) of the Act, 21 U.S.C. section 360bbb-3(b)(1), unless the authorization is terminated or revoked sooner.  Performed at [facility], [place] [address], [place], [place] [address]      Labs: Basic Metabolic Panel: Recent Labs  Lab 09/10/19 0025  NA 141  K 3.0*  CL 114*  CO2 19*  GLUCOSE 146*  BUN 8  CREATININE 0.44  CALCIUM 8.7*  MG 1.9  PHOS 3.7   Liver Function Tests: Recent Labs  Lab 09/10/19 0025  AST 100*  ALT 53*  ALKPHOS 177*  BILITOT 3.1*  PROT 7.4  ALBUMIN 3.7   No results  for input(s): LIPASE, AMYLASE in the last 168 hours. No results for input(s): AMMONIA in the last 168 hours. CBC: Recent Labs  Lab 09/10/19 0025  WBC 11.3*  NEUTROABS 4.8  HGB 7.8*  HCT 22.7*  MCV 92.3  PLT 298   Cardiac Enzymes: No results for input(s): CKTOTAL, CKMB, CKMBINDEX, TROPONINI in the last 168 hours. BNP: Invalid input(s): POCBNP CBG: No results for input(s): GLUCAP in the last 168 hours.  Time coordinating discharge: 50 minutes  Signed:  [facility] 09/15/2019, 2:52 PM

## 2019-11-24 ENCOUNTER — Ambulatory Visit: Payer: Self-pay | Admitting: Family Medicine

## 2020-02-19 ENCOUNTER — Telehealth (HOSPITAL_COMMUNITY): Payer: Self-pay | Admitting: General Practice

## 2020-02-19 NOTE — Telephone Encounter (Signed)
Patient called to obtain the phone number for the scheduler. Patient was given the number [phone].

## 2020-02-25 ENCOUNTER — Encounter: Payer: Self-pay | Admitting: Family Medicine

## 2020-02-27 ENCOUNTER — Other Ambulatory Visit: Payer: Self-pay | Admitting: Family Medicine

## 2020-02-27 DIAGNOSIS — R110 Nausea: Secondary | ICD-10-CM

## 2020-02-27 MED ORDER — PROCHLORPERAZINE MALEATE 10 MG PO TABS: 10.0000 mg | ORAL_TABLET | Freq: Four times a day (QID) | ORAL | 3 refills | Status: AC | PRN

## 2020-03-26 ENCOUNTER — Ambulatory Visit: Payer: Self-pay | Admitting: Family Medicine

## 2020-04-02 DEATH — deceased

## 2021-05-13 NOTE — Telephone Encounter (Signed)
This encounter was created in error - please disregard.

## 2021-12-16 NOTE — Telephone Encounter (Signed)
No note
